# Patient Record
Sex: Male | Born: 1940 | Race: White | Hispanic: No | Marital: Married | Smoking: Never smoker
Health system: Southern US, Community
[De-identification: ages and names within clinical notes are randomized; demographics above are authoritative.]

## PROBLEM LIST (undated history)

## (undated) DIAGNOSIS — E785 Hyperlipidemia, unspecified: Secondary | ICD-10-CM

## (undated) DIAGNOSIS — M4800 Spinal stenosis, site unspecified: Secondary | ICD-10-CM

## (undated) DIAGNOSIS — D649 Anemia, unspecified: Secondary | ICD-10-CM

## (undated) DIAGNOSIS — M47812 Spondylosis without myelopathy or radiculopathy, cervical region: Secondary | ICD-10-CM

## (undated) DIAGNOSIS — G4730 Sleep apnea, unspecified: Secondary | ICD-10-CM

## (undated) DIAGNOSIS — R000 Tachycardia, unspecified: Secondary | ICD-10-CM

## (undated) DIAGNOSIS — K219 Gastro-esophageal reflux disease without esophagitis: Secondary | ICD-10-CM

## (undated) DIAGNOSIS — H269 Unspecified cataract: Secondary | ICD-10-CM

## (undated) DIAGNOSIS — I499 Cardiac arrhythmia, unspecified: Secondary | ICD-10-CM

## (undated) DIAGNOSIS — F32A Depression, unspecified: Secondary | ICD-10-CM

## (undated) DIAGNOSIS — F419 Anxiety disorder, unspecified: Secondary | ICD-10-CM

## (undated) DIAGNOSIS — R351 Nocturia: Secondary | ICD-10-CM

## (undated) DIAGNOSIS — I4891 Unspecified atrial fibrillation: Secondary | ICD-10-CM

## (undated) DIAGNOSIS — Z8679 Personal history of other diseases of the circulatory system: Secondary | ICD-10-CM

## (undated) DIAGNOSIS — E119 Type 2 diabetes mellitus without complications: Secondary | ICD-10-CM

## (undated) DIAGNOSIS — H9190 Unspecified hearing loss, unspecified ear: Secondary | ICD-10-CM

## (undated) DIAGNOSIS — Z87442 Personal history of urinary calculi: Secondary | ICD-10-CM

## (undated) DIAGNOSIS — I10 Essential (primary) hypertension: Secondary | ICD-10-CM

## (undated) DIAGNOSIS — Z9889 Other specified postprocedural states: Secondary | ICD-10-CM

## (undated) DIAGNOSIS — F329 Major depressive disorder, single episode, unspecified: Secondary | ICD-10-CM

## (undated) DIAGNOSIS — T8859XA Other complications of anesthesia, initial encounter: Secondary | ICD-10-CM

## (undated) DIAGNOSIS — M1990 Unspecified osteoarthritis, unspecified site: Secondary | ICD-10-CM

## (undated) DIAGNOSIS — T4145XA Adverse effect of unspecified anesthetic, initial encounter: Secondary | ICD-10-CM

## (undated) HISTORY — DX: Unspecified atrial fibrillation: I48.91

## (undated) HISTORY — DX: Hyperlipidemia, unspecified: E78.5

## (undated) HISTORY — DX: Essential (primary) hypertension: I10

## (undated) HISTORY — PX: ABLATION: SHX5711

## (undated) HISTORY — PX: JOINT REPLACEMENT: SHX530

## (undated) HISTORY — DX: Other specified postprocedural states: Z98.890

## (undated) HISTORY — PX: HERNIA REPAIR: SHX51

## (undated) HISTORY — DX: Unspecified osteoarthritis, unspecified site: M19.90

## (undated) HISTORY — DX: Sleep apnea, unspecified: G47.30

## (undated) HISTORY — DX: Spinal stenosis, site unspecified: M48.00

## (undated) HISTORY — DX: Anxiety disorder, unspecified: F41.9

## (undated) HISTORY — DX: Depression, unspecified: F32.A

## (undated) HISTORY — PX: EYE SURGERY: SHX253

## (undated) HISTORY — PX: BACK SURGERY: SHX140

## (undated) HISTORY — DX: Personal history of other diseases of the circulatory system: Z86.79

## (undated) HISTORY — DX: Major depressive disorder, single episode, unspecified: F32.9

## (undated) HISTORY — PX: TONSILLECTOMY: SUR1361

## (undated) HISTORY — DX: Tachycardia, unspecified: R00.0

## (undated) HISTORY — DX: Type 2 diabetes mellitus without complications: E11.9

## (undated) HISTORY — DX: Spondylosis without myelopathy or radiculopathy, cervical region: M47.812

## (undated) HISTORY — PX: CARDIAC CATHETERIZATION: SHX172

---

## 2005-01-23 ENCOUNTER — Ambulatory Visit: Payer: Self-pay | Admitting: Pain Medicine

## 2005-11-22 ENCOUNTER — Ambulatory Visit: Payer: Self-pay | Admitting: Gastroenterology

## 2006-02-12 ENCOUNTER — Encounter: Payer: Self-pay | Admitting: Internal Medicine

## 2007-07-22 ENCOUNTER — Ambulatory Visit: Payer: Self-pay | Admitting: Internal Medicine

## 2008-03-30 ENCOUNTER — Ambulatory Visit: Payer: Self-pay | Admitting: Internal Medicine

## 2008-04-06 ENCOUNTER — Ambulatory Visit: Payer: Self-pay | Admitting: Family Medicine

## 2008-04-13 ENCOUNTER — Ambulatory Visit: Payer: Self-pay | Admitting: Family Medicine

## 2008-04-28 ENCOUNTER — Ambulatory Visit: Payer: Self-pay | Admitting: Internal Medicine

## 2008-05-14 ENCOUNTER — Ambulatory Visit: Payer: Self-pay | Admitting: Family Medicine

## 2008-05-22 ENCOUNTER — Inpatient Hospital Stay: Payer: Self-pay | Admitting: Internal Medicine

## 2008-09-15 ENCOUNTER — Emergency Department: Payer: Self-pay | Admitting: Internal Medicine

## 2010-03-13 ENCOUNTER — Ambulatory Visit: Payer: Self-pay | Admitting: Internal Medicine

## 2010-03-21 ENCOUNTER — Ambulatory Visit: Payer: Self-pay

## 2010-04-14 ENCOUNTER — Ambulatory Visit: Payer: Self-pay

## 2010-06-28 NOTE — Letter (Signed)
July 22, 2007    Sean Reyes Reyes,  M.D.  [address] Sean Reyes.  Sean Re[place], [place]  [address]   RE:  Sean Reyes, Reyes  MRN:  [id number]  /  DOB:  08/11/1940   Dear Sean Reyes Reyes:   It was a pleasure to see Sean Reyes Reyes that I think is a request of yours  concerning atrial fibrillation.   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  These  episodes are quite symptomatic with fatigue, exercise intolerance, and  significant recovery wasting.  He has been tried on Sean Reyes Reyes which worked  for a number of months and then failed. He was then submitted for atrial  fibrillation ablation in October 2008 which was complicated by cardiac  perforation, resulting in its being aborted.  I do not know if any RF  was delivered.  He ended up in the hospital with a pericardial drain,  was subsequently discharged, and then was treated with sotalol. Sotalol  was increased to 160 b.i.d., and, over the last 6 months, he has done  pretty well with now relatively infrequent spells occurring about once a  month.  Sometimes they are quite severe. Most recently it happened about  4 weeks ago and left him fatigued for a good couple of days afterwards.   His LV function is apparently normal. I infer this rather than have  specific data regarding this.   His thromboembolic risk factors are notable for hypertension and  diabetes.   CURRENT MEDICATIONS:  1. Sotalol 160 b.i.d.  2. Warfarin.  3. Diovan.  4. Metoprolol 50 a day.   ALLERGIES:  He is allergic to Sean Reyes Reyes.   REVIEW OF SYSTEMS:  Noncontributory but includes arthritis and  depression.   SOCIAL HISTORY:  There is a great deal of stress at work in that his  partner has been out following bypass for the last 6 months.   PHYSICAL EXAMINATION:  GENERAL:  He is a middle-age Caucasian male  appearing his stated age of 61, almost 3.  VITAL SIGNS:  His blood pressure today  was 160/80.  His pulse was 59.  His weight was 217.  HEENT:  Exam demonstrated no icterus or xanthoma.  NECK:  The neck veins were flat.  The carotids are brisk and full  bilaterally without bruits.  BACK:  Without kyphosis, scoliosis.  LUNGS:  Clear.  HEART:  Sounds were regular without murmurs or gallops.  ABDOMEN:  Soft with active bowel sounds without midline pulsation or  hepatomegaly.  EXTREMITIES:  Femoral pulses were 2+. Distal pulses were intact.  There  was no clubbing, cyanosis or edema.  NEUROLOGIC:  Exam was grossly normal.  SKIN:  Warm and dry.   Electrocardiogram dated today demonstrated sinus rhythm at 59 with  intervals of 0.19/0.19/0.44. The axis was 62 degrees. There were some T-  wave flattening.   IMPRESSION:  1. Paroxysmal atrial fibrillation.  2. Sotalol therapy for #1.  3. Status post ablation attempt, aborted by cardiac perforation.  4. Thromboembolic risk factors notable for:      a.     Diabetes.      b.     Hypertension.   Sean Reyes Reyes, Sean Reyes Reyes has paroxysmal atrial fibrillation that is quite  symptomatic.  We discussed treatment options including catheter ablation  as well as alternative antiarrhythmic drug therapy including amiodarone.  I should note that he is probably also a  candidate for flecainide and  propafenone.  He at this point, though, feels like his events are  sufficiently infrequent and the stress from the perforation was  sufficiently great that he and his family would like to hold steady on  his current course.  We elected, thus, to continue sotalol at 160 b.i.d.  I would ask that you keep close track of his potassium as it should be  maintained greater than 4.   We will plan to see him again in 2-3 months' time, and he is to let us  know if he has a significant up tick and the frequency of episodes.   Thanks very much for the consultation.    Sincerely,      Sean Reyes Salvia, MD, Sean Reyes. Sean Reyes Reyes  Electronically Signed    SCK/MedQ  DD:  07/22/2007  DT: 07/22/2007  Job #: [id number]

## 2010-07-25 ENCOUNTER — Encounter: Payer: Self-pay | Admitting: Cardiovascular Disease

## 2010-08-15 ENCOUNTER — Ambulatory Visit: Payer: Self-pay | Admitting: Surgery

## 2010-08-22 ENCOUNTER — Ambulatory Visit: Payer: Self-pay | Admitting: Surgery

## 2010-08-26 ENCOUNTER — Emergency Department: Payer: Self-pay | Admitting: *Deleted

## 2010-08-27 ENCOUNTER — Inpatient Hospital Stay: Payer: Self-pay | Admitting: General Surgery

## 2010-08-29 ENCOUNTER — Observation Stay: Payer: Self-pay | Admitting: Surgery

## 2011-02-21 DIAGNOSIS — I4891 Unspecified atrial fibrillation: Secondary | ICD-10-CM | POA: Diagnosis not present

## 2011-03-23 DIAGNOSIS — I4891 Unspecified atrial fibrillation: Secondary | ICD-10-CM | POA: Diagnosis not present

## 2011-03-23 DIAGNOSIS — E119 Type 2 diabetes mellitus without complications: Secondary | ICD-10-CM | POA: Diagnosis not present

## 2011-04-18 DIAGNOSIS — Z7901 Long term (current) use of anticoagulants: Secondary | ICD-10-CM | POA: Diagnosis not present

## 2011-04-18 DIAGNOSIS — I10 Essential (primary) hypertension: Secondary | ICD-10-CM | POA: Diagnosis not present

## 2011-04-18 DIAGNOSIS — I4891 Unspecified atrial fibrillation: Secondary | ICD-10-CM | POA: Diagnosis not present

## 2011-04-18 DIAGNOSIS — E119 Type 2 diabetes mellitus without complications: Secondary | ICD-10-CM | POA: Diagnosis not present

## 2011-04-20 DIAGNOSIS — I4891 Unspecified atrial fibrillation: Secondary | ICD-10-CM | POA: Diagnosis not present

## 2011-05-22 DIAGNOSIS — I4891 Unspecified atrial fibrillation: Secondary | ICD-10-CM | POA: Diagnosis not present

## 2011-06-20 DIAGNOSIS — I10 Essential (primary) hypertension: Secondary | ICD-10-CM | POA: Diagnosis not present

## 2011-06-20 DIAGNOSIS — I4891 Unspecified atrial fibrillation: Secondary | ICD-10-CM | POA: Diagnosis not present

## 2011-06-20 DIAGNOSIS — E7800 Pure hypercholesterolemia, unspecified: Secondary | ICD-10-CM | POA: Diagnosis not present

## 2011-06-20 DIAGNOSIS — E119 Type 2 diabetes mellitus without complications: Secondary | ICD-10-CM | POA: Diagnosis not present

## 2011-07-17 DIAGNOSIS — L821 Other seborrheic keratosis: Secondary | ICD-10-CM | POA: Diagnosis not present

## 2011-07-17 DIAGNOSIS — I8310 Varicose veins of unspecified lower extremity with inflammation: Secondary | ICD-10-CM | POA: Diagnosis not present

## 2011-07-17 DIAGNOSIS — L570 Actinic keratosis: Secondary | ICD-10-CM | POA: Diagnosis not present

## 2011-07-20 DIAGNOSIS — I4891 Unspecified atrial fibrillation: Secondary | ICD-10-CM | POA: Diagnosis not present

## 2011-08-30 DIAGNOSIS — I10 Essential (primary) hypertension: Secondary | ICD-10-CM | POA: Diagnosis not present

## 2011-08-30 DIAGNOSIS — E7800 Pure hypercholesterolemia, unspecified: Secondary | ICD-10-CM | POA: Diagnosis not present

## 2011-08-30 DIAGNOSIS — E119 Type 2 diabetes mellitus without complications: Secondary | ICD-10-CM | POA: Diagnosis not present

## 2011-08-30 DIAGNOSIS — I4891 Unspecified atrial fibrillation: Secondary | ICD-10-CM | POA: Diagnosis not present

## 2011-09-05 DIAGNOSIS — E119 Type 2 diabetes mellitus without complications: Secondary | ICD-10-CM | POA: Diagnosis not present

## 2011-09-05 DIAGNOSIS — I059 Rheumatic mitral valve disease, unspecified: Secondary | ICD-10-CM | POA: Diagnosis not present

## 2011-09-05 DIAGNOSIS — I10 Essential (primary) hypertension: Secondary | ICD-10-CM | POA: Diagnosis not present

## 2011-09-05 DIAGNOSIS — I4891 Unspecified atrial fibrillation: Secondary | ICD-10-CM | POA: Diagnosis not present

## 2011-09-15 DIAGNOSIS — I4891 Unspecified atrial fibrillation: Secondary | ICD-10-CM | POA: Diagnosis not present

## 2011-09-28 DIAGNOSIS — I4891 Unspecified atrial fibrillation: Secondary | ICD-10-CM | POA: Diagnosis not present

## 2011-10-17 DIAGNOSIS — I10 Essential (primary) hypertension: Secondary | ICD-10-CM | POA: Diagnosis not present

## 2011-10-17 DIAGNOSIS — I4891 Unspecified atrial fibrillation: Secondary | ICD-10-CM | POA: Diagnosis not present

## 2011-10-17 DIAGNOSIS — E119 Type 2 diabetes mellitus without complications: Secondary | ICD-10-CM | POA: Diagnosis not present

## 2011-10-17 DIAGNOSIS — Z7901 Long term (current) use of anticoagulants: Secondary | ICD-10-CM | POA: Diagnosis not present

## 2011-10-19 DIAGNOSIS — I480 Paroxysmal atrial fibrillation: Secondary | ICD-10-CM | POA: Insufficient documentation

## 2011-10-19 DIAGNOSIS — Z9889 Other specified postprocedural states: Secondary | ICD-10-CM | POA: Insufficient documentation

## 2011-10-20 DIAGNOSIS — M1990 Unspecified osteoarthritis, unspecified site: Secondary | ICD-10-CM | POA: Insufficient documentation

## 2011-10-20 DIAGNOSIS — I4891 Unspecified atrial fibrillation: Secondary | ICD-10-CM | POA: Diagnosis not present

## 2011-10-27 DIAGNOSIS — E119 Type 2 diabetes mellitus without complications: Secondary | ICD-10-CM | POA: Diagnosis present

## 2011-10-27 DIAGNOSIS — I4891 Unspecified atrial fibrillation: Secondary | ICD-10-CM | POA: Diagnosis not present

## 2011-10-27 DIAGNOSIS — I10 Essential (primary) hypertension: Secondary | ICD-10-CM | POA: Diagnosis present

## 2011-10-27 DIAGNOSIS — M129 Arthropathy, unspecified: Secondary | ICD-10-CM | POA: Diagnosis present

## 2011-11-06 DIAGNOSIS — E876 Hypokalemia: Secondary | ICD-10-CM | POA: Diagnosis not present

## 2011-11-06 DIAGNOSIS — I4891 Unspecified atrial fibrillation: Secondary | ICD-10-CM | POA: Diagnosis not present

## 2011-11-13 DIAGNOSIS — T169XXA Foreign body in ear, unspecified ear, initial encounter: Secondary | ICD-10-CM | POA: Diagnosis not present

## 2011-11-13 DIAGNOSIS — I4891 Unspecified atrial fibrillation: Secondary | ICD-10-CM | POA: Diagnosis not present

## 2011-12-05 DIAGNOSIS — I4891 Unspecified atrial fibrillation: Secondary | ICD-10-CM | POA: Diagnosis not present

## 2011-12-05 DIAGNOSIS — I059 Rheumatic mitral valve disease, unspecified: Secondary | ICD-10-CM | POA: Diagnosis not present

## 2011-12-05 DIAGNOSIS — I209 Angina pectoris, unspecified: Secondary | ICD-10-CM | POA: Diagnosis not present

## 2011-12-05 DIAGNOSIS — I119 Hypertensive heart disease without heart failure: Secondary | ICD-10-CM | POA: Diagnosis not present

## 2011-12-11 DIAGNOSIS — I209 Angina pectoris, unspecified: Secondary | ICD-10-CM | POA: Diagnosis not present

## 2011-12-13 DIAGNOSIS — T169XXA Foreign body in ear, unspecified ear, initial encounter: Secondary | ICD-10-CM | POA: Diagnosis not present

## 2011-12-13 DIAGNOSIS — I4891 Unspecified atrial fibrillation: Secondary | ICD-10-CM | POA: Diagnosis not present

## 2011-12-15 DIAGNOSIS — Z7901 Long term (current) use of anticoagulants: Secondary | ICD-10-CM | POA: Diagnosis not present

## 2011-12-15 DIAGNOSIS — I4891 Unspecified atrial fibrillation: Secondary | ICD-10-CM | POA: Diagnosis not present

## 2011-12-15 DIAGNOSIS — I10 Essential (primary) hypertension: Secondary | ICD-10-CM | POA: Diagnosis not present

## 2011-12-19 DIAGNOSIS — R69 Illness, unspecified: Secondary | ICD-10-CM | POA: Diagnosis not present

## 2011-12-19 DIAGNOSIS — R7989 Other specified abnormal findings of blood chemistry: Secondary | ICD-10-CM | POA: Diagnosis not present

## 2011-12-19 DIAGNOSIS — I4891 Unspecified atrial fibrillation: Secondary | ICD-10-CM | POA: Diagnosis not present

## 2011-12-20 DIAGNOSIS — G4730 Sleep apnea, unspecified: Secondary | ICD-10-CM | POA: Diagnosis present

## 2011-12-20 DIAGNOSIS — I459 Conduction disorder, unspecified: Secondary | ICD-10-CM | POA: Insufficient documentation

## 2011-12-20 DIAGNOSIS — D649 Anemia, unspecified: Secondary | ICD-10-CM | POA: Diagnosis not present

## 2011-12-20 DIAGNOSIS — N179 Acute kidney failure, unspecified: Secondary | ICD-10-CM | POA: Diagnosis not present

## 2011-12-20 DIAGNOSIS — I10 Essential (primary) hypertension: Secondary | ICD-10-CM | POA: Diagnosis present

## 2011-12-20 DIAGNOSIS — I499 Cardiac arrhythmia, unspecified: Secondary | ICD-10-CM | POA: Diagnosis not present

## 2011-12-20 DIAGNOSIS — I959 Hypotension, unspecified: Secondary | ICD-10-CM | POA: Diagnosis not present

## 2011-12-20 DIAGNOSIS — E119 Type 2 diabetes mellitus without complications: Secondary | ICD-10-CM | POA: Diagnosis present

## 2011-12-20 DIAGNOSIS — I4891 Unspecified atrial fibrillation: Secondary | ICD-10-CM | POA: Diagnosis not present

## 2011-12-20 DIAGNOSIS — N400 Enlarged prostate without lower urinary tract symptoms: Secondary | ICD-10-CM | POA: Diagnosis present

## 2011-12-20 DIAGNOSIS — IMO0002 Reserved for concepts with insufficient information to code with codable children: Secondary | ICD-10-CM | POA: Diagnosis present

## 2011-12-20 DIAGNOSIS — I9589 Other hypotension: Secondary | ICD-10-CM | POA: Diagnosis present

## 2011-12-25 DIAGNOSIS — I4891 Unspecified atrial fibrillation: Secondary | ICD-10-CM | POA: Diagnosis not present

## 2011-12-27 DIAGNOSIS — Z23 Encounter for immunization: Secondary | ICD-10-CM | POA: Diagnosis not present

## 2011-12-28 DIAGNOSIS — E119 Type 2 diabetes mellitus without complications: Secondary | ICD-10-CM | POA: Diagnosis not present

## 2011-12-28 DIAGNOSIS — Z0000 Encounter for general adult medical examination without abnormal findings: Secondary | ICD-10-CM | POA: Diagnosis not present

## 2011-12-28 DIAGNOSIS — I10 Essential (primary) hypertension: Secondary | ICD-10-CM | POA: Diagnosis not present

## 2011-12-28 DIAGNOSIS — E7800 Pure hypercholesterolemia, unspecified: Secondary | ICD-10-CM | POA: Diagnosis not present

## 2011-12-28 DIAGNOSIS — Z125 Encounter for screening for malignant neoplasm of prostate: Secondary | ICD-10-CM | POA: Diagnosis not present

## 2011-12-28 DIAGNOSIS — I4891 Unspecified atrial fibrillation: Secondary | ICD-10-CM | POA: Diagnosis not present

## 2012-01-01 DIAGNOSIS — I4891 Unspecified atrial fibrillation: Secondary | ICD-10-CM | POA: Diagnosis not present

## 2012-01-03 DIAGNOSIS — I4891 Unspecified atrial fibrillation: Secondary | ICD-10-CM | POA: Diagnosis not present

## 2012-01-04 DIAGNOSIS — I4891 Unspecified atrial fibrillation: Secondary | ICD-10-CM | POA: Diagnosis not present

## 2012-01-16 DIAGNOSIS — D239 Other benign neoplasm of skin, unspecified: Secondary | ICD-10-CM | POA: Diagnosis not present

## 2012-01-16 DIAGNOSIS — L570 Actinic keratosis: Secondary | ICD-10-CM | POA: Diagnosis not present

## 2012-01-16 DIAGNOSIS — L578 Other skin changes due to chronic exposure to nonionizing radiation: Secondary | ICD-10-CM | POA: Diagnosis not present

## 2012-01-16 DIAGNOSIS — D485 Neoplasm of uncertain behavior of skin: Secondary | ICD-10-CM | POA: Diagnosis not present

## 2012-01-16 DIAGNOSIS — L821 Other seborrheic keratosis: Secondary | ICD-10-CM | POA: Diagnosis not present

## 2012-01-24 DIAGNOSIS — I10 Essential (primary) hypertension: Secondary | ICD-10-CM | POA: Diagnosis not present

## 2012-01-24 DIAGNOSIS — E782 Mixed hyperlipidemia: Secondary | ICD-10-CM | POA: Diagnosis not present

## 2012-01-24 DIAGNOSIS — I4891 Unspecified atrial fibrillation: Secondary | ICD-10-CM | POA: Diagnosis not present

## 2012-01-26 DIAGNOSIS — R5381 Other malaise: Secondary | ICD-10-CM | POA: Diagnosis not present

## 2012-01-26 DIAGNOSIS — R5383 Other fatigue: Secondary | ICD-10-CM | POA: Diagnosis not present

## 2012-01-26 DIAGNOSIS — Z7901 Long term (current) use of anticoagulants: Secondary | ICD-10-CM | POA: Diagnosis not present

## 2012-01-26 DIAGNOSIS — I4891 Unspecified atrial fibrillation: Secondary | ICD-10-CM | POA: Diagnosis not present

## 2012-01-31 DIAGNOSIS — I4891 Unspecified atrial fibrillation: Secondary | ICD-10-CM | POA: Diagnosis not present

## 2012-02-06 DIAGNOSIS — I10 Essential (primary) hypertension: Secondary | ICD-10-CM | POA: Diagnosis not present

## 2012-02-06 DIAGNOSIS — I059 Rheumatic mitral valve disease, unspecified: Secondary | ICD-10-CM | POA: Diagnosis not present

## 2012-02-06 DIAGNOSIS — E782 Mixed hyperlipidemia: Secondary | ICD-10-CM | POA: Diagnosis not present

## 2012-02-06 DIAGNOSIS — I4891 Unspecified atrial fibrillation: Secondary | ICD-10-CM | POA: Diagnosis not present

## 2012-02-27 DIAGNOSIS — I4891 Unspecified atrial fibrillation: Secondary | ICD-10-CM | POA: Diagnosis not present

## 2012-03-01 DIAGNOSIS — I10 Essential (primary) hypertension: Secondary | ICD-10-CM | POA: Diagnosis not present

## 2012-03-01 DIAGNOSIS — I4891 Unspecified atrial fibrillation: Secondary | ICD-10-CM | POA: Diagnosis not present

## 2012-04-01 DIAGNOSIS — E782 Mixed hyperlipidemia: Secondary | ICD-10-CM | POA: Diagnosis not present

## 2012-04-01 DIAGNOSIS — I059 Rheumatic mitral valve disease, unspecified: Secondary | ICD-10-CM | POA: Diagnosis not present

## 2012-04-01 DIAGNOSIS — I10 Essential (primary) hypertension: Secondary | ICD-10-CM | POA: Diagnosis not present

## 2012-04-01 DIAGNOSIS — I4891 Unspecified atrial fibrillation: Secondary | ICD-10-CM | POA: Diagnosis not present

## 2012-05-01 DIAGNOSIS — I4891 Unspecified atrial fibrillation: Secondary | ICD-10-CM | POA: Diagnosis not present

## 2012-05-16 DIAGNOSIS — E119 Type 2 diabetes mellitus without complications: Secondary | ICD-10-CM | POA: Diagnosis not present

## 2012-05-16 DIAGNOSIS — I10 Essential (primary) hypertension: Secondary | ICD-10-CM | POA: Diagnosis not present

## 2012-05-16 DIAGNOSIS — I4891 Unspecified atrial fibrillation: Secondary | ICD-10-CM | POA: Diagnosis not present

## 2012-06-11 DIAGNOSIS — I4891 Unspecified atrial fibrillation: Secondary | ICD-10-CM | POA: Diagnosis not present

## 2012-06-19 DIAGNOSIS — I4891 Unspecified atrial fibrillation: Secondary | ICD-10-CM | POA: Diagnosis not present

## 2012-06-19 DIAGNOSIS — I10 Essential (primary) hypertension: Secondary | ICD-10-CM | POA: Diagnosis not present

## 2012-06-19 DIAGNOSIS — Z7901 Long term (current) use of anticoagulants: Secondary | ICD-10-CM | POA: Diagnosis not present

## 2012-06-24 DIAGNOSIS — E119 Type 2 diabetes mellitus without complications: Secondary | ICD-10-CM | POA: Diagnosis not present

## 2012-06-24 DIAGNOSIS — H2510 Age-related nuclear cataract, unspecified eye: Secondary | ICD-10-CM | POA: Diagnosis not present

## 2012-06-25 DIAGNOSIS — I4891 Unspecified atrial fibrillation: Secondary | ICD-10-CM | POA: Diagnosis not present

## 2012-06-26 DIAGNOSIS — I4891 Unspecified atrial fibrillation: Secondary | ICD-10-CM | POA: Diagnosis not present

## 2012-07-01 ENCOUNTER — Ambulatory Visit: Payer: Self-pay | Admitting: Internal Medicine

## 2012-07-01 DIAGNOSIS — I10 Essential (primary) hypertension: Secondary | ICD-10-CM | POA: Diagnosis not present

## 2012-07-01 DIAGNOSIS — E785 Hyperlipidemia, unspecified: Secondary | ICD-10-CM | POA: Diagnosis not present

## 2012-07-01 DIAGNOSIS — Z7901 Long term (current) use of anticoagulants: Secondary | ICD-10-CM | POA: Diagnosis not present

## 2012-07-01 DIAGNOSIS — Z79899 Other long term (current) drug therapy: Secondary | ICD-10-CM | POA: Diagnosis not present

## 2012-07-01 DIAGNOSIS — G4730 Sleep apnea, unspecified: Secondary | ICD-10-CM | POA: Diagnosis not present

## 2012-07-01 DIAGNOSIS — Z889 Allergy status to unspecified drugs, medicaments and biological substances status: Secondary | ICD-10-CM | POA: Diagnosis not present

## 2012-07-01 DIAGNOSIS — Z8489 Family history of other specified conditions: Secondary | ICD-10-CM | POA: Diagnosis not present

## 2012-07-01 DIAGNOSIS — I4510 Unspecified right bundle-branch block: Secondary | ICD-10-CM | POA: Diagnosis not present

## 2012-07-01 DIAGNOSIS — E119 Type 2 diabetes mellitus without complications: Secondary | ICD-10-CM | POA: Diagnosis not present

## 2012-07-01 DIAGNOSIS — I4891 Unspecified atrial fibrillation: Secondary | ICD-10-CM | POA: Diagnosis not present

## 2012-07-01 LAB — PROTIME-INR
INR: 2.9
Prothrombin Time: 29.3 secs — ABNORMAL HIGH (ref 11.5–14.7)

## 2012-07-01 LAB — BASIC METABOLIC PANEL
Anion Gap: 5 — ABNORMAL LOW (ref 7–16)
BUN: 14 mg/dL (ref 7–18)
Calcium, Total: 9.2 mg/dL (ref 8.5–10.1)
Chloride: 104 mmol/L (ref 98–107)
Co2: 29 mmol/L (ref 21–32)
Creatinine: 1.27 mg/dL (ref 0.60–1.30)
EGFR (African American): >60
EGFR (Non-African Amer.): 57 — ABNORMAL LOW
Glucose: 120 mg/dL — ABNORMAL HIGH (ref 65–99)
Osmolality: 277 (ref 275–301)
Potassium: 3.9 mmol/L (ref 3.5–5.1)
Sodium: 138 mmol/L (ref 136–145)

## 2012-07-02 DIAGNOSIS — I4891 Unspecified atrial fibrillation: Secondary | ICD-10-CM | POA: Diagnosis not present

## 2012-07-02 DIAGNOSIS — Z9889 Other specified postprocedural states: Secondary | ICD-10-CM | POA: Diagnosis not present

## 2012-07-02 DIAGNOSIS — G4733 Obstructive sleep apnea (adult) (pediatric): Secondary | ICD-10-CM | POA: Diagnosis not present

## 2012-07-02 DIAGNOSIS — I10 Essential (primary) hypertension: Secondary | ICD-10-CM | POA: Diagnosis not present

## 2012-07-15 DIAGNOSIS — I059 Rheumatic mitral valve disease, unspecified: Secondary | ICD-10-CM | POA: Diagnosis not present

## 2012-07-15 DIAGNOSIS — I4891 Unspecified atrial fibrillation: Secondary | ICD-10-CM | POA: Diagnosis not present

## 2012-07-15 DIAGNOSIS — E782 Mixed hyperlipidemia: Secondary | ICD-10-CM | POA: Diagnosis not present

## 2012-07-15 DIAGNOSIS — I119 Hypertensive heart disease without heart failure: Secondary | ICD-10-CM | POA: Diagnosis not present

## 2012-07-16 DIAGNOSIS — D239 Other benign neoplasm of skin, unspecified: Secondary | ICD-10-CM | POA: Diagnosis not present

## 2012-07-16 DIAGNOSIS — D485 Neoplasm of uncertain behavior of skin: Secondary | ICD-10-CM | POA: Diagnosis not present

## 2012-07-16 DIAGNOSIS — L821 Other seborrheic keratosis: Secondary | ICD-10-CM | POA: Diagnosis not present

## 2012-07-16 DIAGNOSIS — L578 Other skin changes due to chronic exposure to nonionizing radiation: Secondary | ICD-10-CM | POA: Diagnosis not present

## 2012-07-25 DIAGNOSIS — I4891 Unspecified atrial fibrillation: Secondary | ICD-10-CM | POA: Diagnosis not present

## 2012-08-26 DIAGNOSIS — I059 Rheumatic mitral valve disease, unspecified: Secondary | ICD-10-CM | POA: Diagnosis not present

## 2012-08-26 DIAGNOSIS — I4891 Unspecified atrial fibrillation: Secondary | ICD-10-CM | POA: Diagnosis not present

## 2012-08-26 DIAGNOSIS — I119 Hypertensive heart disease without heart failure: Secondary | ICD-10-CM | POA: Diagnosis not present

## 2012-08-26 DIAGNOSIS — E782 Mixed hyperlipidemia: Secondary | ICD-10-CM | POA: Diagnosis not present

## 2012-09-02 DIAGNOSIS — I10 Essential (primary) hypertension: Secondary | ICD-10-CM | POA: Diagnosis not present

## 2012-09-02 DIAGNOSIS — E7800 Pure hypercholesterolemia, unspecified: Secondary | ICD-10-CM | POA: Diagnosis not present

## 2012-09-02 DIAGNOSIS — E119 Type 2 diabetes mellitus without complications: Secondary | ICD-10-CM | POA: Diagnosis not present

## 2012-09-02 DIAGNOSIS — I4891 Unspecified atrial fibrillation: Secondary | ICD-10-CM | POA: Diagnosis not present

## 2012-10-02 DIAGNOSIS — I4891 Unspecified atrial fibrillation: Secondary | ICD-10-CM | POA: Diagnosis not present

## 2012-11-05 DIAGNOSIS — I4891 Unspecified atrial fibrillation: Secondary | ICD-10-CM | POA: Diagnosis not present

## 2012-12-05 DIAGNOSIS — E119 Type 2 diabetes mellitus without complications: Secondary | ICD-10-CM | POA: Diagnosis not present

## 2012-12-05 DIAGNOSIS — E7800 Pure hypercholesterolemia, unspecified: Secondary | ICD-10-CM | POA: Diagnosis not present

## 2012-12-05 DIAGNOSIS — I4891 Unspecified atrial fibrillation: Secondary | ICD-10-CM | POA: Diagnosis not present

## 2012-12-05 DIAGNOSIS — I10 Essential (primary) hypertension: Secondary | ICD-10-CM | POA: Diagnosis not present

## 2012-12-18 DIAGNOSIS — Z23 Encounter for immunization: Secondary | ICD-10-CM | POA: Diagnosis not present

## 2012-12-23 DIAGNOSIS — M25559 Pain in unspecified hip: Secondary | ICD-10-CM | POA: Diagnosis not present

## 2012-12-23 DIAGNOSIS — M545 Low back pain, unspecified: Secondary | ICD-10-CM | POA: Diagnosis not present

## 2012-12-23 DIAGNOSIS — M47817 Spondylosis without myelopathy or radiculopathy, lumbosacral region: Secondary | ICD-10-CM | POA: Diagnosis not present

## 2012-12-23 DIAGNOSIS — Z96649 Presence of unspecified artificial hip joint: Secondary | ICD-10-CM | POA: Diagnosis not present

## 2012-12-23 DIAGNOSIS — M25459 Effusion, unspecified hip: Secondary | ICD-10-CM | POA: Diagnosis not present

## 2012-12-31 DIAGNOSIS — I10 Essential (primary) hypertension: Secondary | ICD-10-CM | POA: Diagnosis not present

## 2012-12-31 DIAGNOSIS — I4891 Unspecified atrial fibrillation: Secondary | ICD-10-CM | POA: Diagnosis not present

## 2012-12-31 DIAGNOSIS — Z7901 Long term (current) use of anticoagulants: Secondary | ICD-10-CM | POA: Diagnosis not present

## 2013-01-01 DIAGNOSIS — I4891 Unspecified atrial fibrillation: Secondary | ICD-10-CM | POA: Diagnosis not present

## 2013-01-02 DIAGNOSIS — M7989 Other specified soft tissue disorders: Secondary | ICD-10-CM | POA: Diagnosis not present

## 2013-01-02 DIAGNOSIS — M25559 Pain in unspecified hip: Secondary | ICD-10-CM | POA: Diagnosis not present

## 2013-01-14 DIAGNOSIS — M25559 Pain in unspecified hip: Secondary | ICD-10-CM | POA: Diagnosis not present

## 2013-01-14 DIAGNOSIS — Z96649 Presence of unspecified artificial hip joint: Secondary | ICD-10-CM | POA: Diagnosis not present

## 2013-01-14 DIAGNOSIS — M25459 Effusion, unspecified hip: Secondary | ICD-10-CM | POA: Diagnosis not present

## 2013-01-16 DIAGNOSIS — I4891 Unspecified atrial fibrillation: Secondary | ICD-10-CM | POA: Diagnosis not present

## 2013-01-22 DIAGNOSIS — R1032 Left lower quadrant pain: Secondary | ICD-10-CM | POA: Diagnosis not present

## 2013-01-22 DIAGNOSIS — M25559 Pain in unspecified hip: Secondary | ICD-10-CM | POA: Diagnosis not present

## 2013-01-22 DIAGNOSIS — M169 Osteoarthritis of hip, unspecified: Secondary | ICD-10-CM | POA: Diagnosis not present

## 2013-01-22 DIAGNOSIS — M1610 Unilateral primary osteoarthritis, unspecified hip: Secondary | ICD-10-CM | POA: Diagnosis not present

## 2013-02-14 DIAGNOSIS — I4891 Unspecified atrial fibrillation: Secondary | ICD-10-CM | POA: Diagnosis not present

## 2013-02-18 DIAGNOSIS — I4891 Unspecified atrial fibrillation: Secondary | ICD-10-CM | POA: Diagnosis not present

## 2013-03-03 DIAGNOSIS — I4891 Unspecified atrial fibrillation: Secondary | ICD-10-CM | POA: Diagnosis not present

## 2013-03-03 DIAGNOSIS — E7800 Pure hypercholesterolemia, unspecified: Secondary | ICD-10-CM | POA: Diagnosis not present

## 2013-03-03 DIAGNOSIS — E119 Type 2 diabetes mellitus without complications: Secondary | ICD-10-CM | POA: Diagnosis not present

## 2013-03-03 DIAGNOSIS — I10 Essential (primary) hypertension: Secondary | ICD-10-CM | POA: Diagnosis not present

## 2013-03-03 DIAGNOSIS — E785 Hyperlipidemia, unspecified: Secondary | ICD-10-CM | POA: Diagnosis not present

## 2013-03-18 DIAGNOSIS — D239 Other benign neoplasm of skin, unspecified: Secondary | ICD-10-CM | POA: Diagnosis not present

## 2013-03-18 DIAGNOSIS — L821 Other seborrheic keratosis: Secondary | ICD-10-CM | POA: Diagnosis not present

## 2013-03-18 DIAGNOSIS — L578 Other skin changes due to chronic exposure to nonionizing radiation: Secondary | ICD-10-CM | POA: Diagnosis not present

## 2013-03-18 DIAGNOSIS — L570 Actinic keratosis: Secondary | ICD-10-CM | POA: Diagnosis not present

## 2013-03-18 DIAGNOSIS — D485 Neoplasm of uncertain behavior of skin: Secondary | ICD-10-CM | POA: Diagnosis not present

## 2013-04-15 DIAGNOSIS — I4891 Unspecified atrial fibrillation: Secondary | ICD-10-CM | POA: Diagnosis not present

## 2013-05-19 DIAGNOSIS — I4891 Unspecified atrial fibrillation: Secondary | ICD-10-CM | POA: Diagnosis not present

## 2013-05-19 DIAGNOSIS — E119 Type 2 diabetes mellitus without complications: Secondary | ICD-10-CM | POA: Diagnosis not present

## 2013-05-19 DIAGNOSIS — I10 Essential (primary) hypertension: Secondary | ICD-10-CM | POA: Diagnosis not present

## 2013-06-04 DIAGNOSIS — Z125 Encounter for screening for malignant neoplasm of prostate: Secondary | ICD-10-CM | POA: Diagnosis not present

## 2013-06-04 DIAGNOSIS — E7200 Disorders of amino-acid transport, unspecified: Secondary | ICD-10-CM | POA: Diagnosis not present

## 2013-06-04 DIAGNOSIS — E7800 Pure hypercholesterolemia, unspecified: Secondary | ICD-10-CM | POA: Diagnosis not present

## 2013-06-04 DIAGNOSIS — Z0000 Encounter for general adult medical examination without abnormal findings: Secondary | ICD-10-CM | POA: Diagnosis not present

## 2013-06-04 DIAGNOSIS — I4891 Unspecified atrial fibrillation: Secondary | ICD-10-CM | POA: Diagnosis not present

## 2013-06-04 DIAGNOSIS — I10 Essential (primary) hypertension: Secondary | ICD-10-CM | POA: Diagnosis not present

## 2013-06-10 DIAGNOSIS — R7989 Other specified abnormal findings of blood chemistry: Secondary | ICD-10-CM | POA: Diagnosis not present

## 2013-06-16 DIAGNOSIS — G4730 Sleep apnea, unspecified: Secondary | ICD-10-CM | POA: Diagnosis not present

## 2013-06-16 DIAGNOSIS — I4892 Unspecified atrial flutter: Secondary | ICD-10-CM | POA: Diagnosis not present

## 2013-06-16 DIAGNOSIS — E119 Type 2 diabetes mellitus without complications: Secondary | ICD-10-CM | POA: Diagnosis not present

## 2013-06-16 DIAGNOSIS — I119 Hypertensive heart disease without heart failure: Secondary | ICD-10-CM | POA: Diagnosis not present

## 2013-06-24 DIAGNOSIS — I4891 Unspecified atrial fibrillation: Secondary | ICD-10-CM | POA: Diagnosis not present

## 2013-07-23 DIAGNOSIS — H2510 Age-related nuclear cataract, unspecified eye: Secondary | ICD-10-CM | POA: Diagnosis not present

## 2013-07-23 DIAGNOSIS — E119 Type 2 diabetes mellitus without complications: Secondary | ICD-10-CM | POA: Diagnosis not present

## 2013-07-29 DIAGNOSIS — I4891 Unspecified atrial fibrillation: Secondary | ICD-10-CM | POA: Diagnosis not present

## 2013-09-03 DIAGNOSIS — E7800 Pure hypercholesterolemia, unspecified: Secondary | ICD-10-CM | POA: Diagnosis not present

## 2013-09-03 DIAGNOSIS — I4891 Unspecified atrial fibrillation: Secondary | ICD-10-CM | POA: Diagnosis not present

## 2013-09-03 DIAGNOSIS — I10 Essential (primary) hypertension: Secondary | ICD-10-CM | POA: Diagnosis not present

## 2013-09-03 DIAGNOSIS — E119 Type 2 diabetes mellitus without complications: Secondary | ICD-10-CM | POA: Diagnosis not present

## 2013-09-10 DIAGNOSIS — M5137 Other intervertebral disc degeneration, lumbosacral region: Secondary | ICD-10-CM | POA: Diagnosis not present

## 2013-09-10 DIAGNOSIS — M5030 Other cervical disc degeneration, unspecified cervical region: Secondary | ICD-10-CM | POA: Diagnosis not present

## 2013-09-10 DIAGNOSIS — M999 Biomechanical lesion, unspecified: Secondary | ICD-10-CM | POA: Diagnosis not present

## 2013-09-10 DIAGNOSIS — M9981 Other biomechanical lesions of cervical region: Secondary | ICD-10-CM | POA: Diagnosis not present

## 2013-09-24 DIAGNOSIS — M9981 Other biomechanical lesions of cervical region: Secondary | ICD-10-CM | POA: Diagnosis not present

## 2013-09-24 DIAGNOSIS — M5137 Other intervertebral disc degeneration, lumbosacral region: Secondary | ICD-10-CM | POA: Diagnosis not present

## 2013-09-24 DIAGNOSIS — M999 Biomechanical lesion, unspecified: Secondary | ICD-10-CM | POA: Diagnosis not present

## 2013-09-24 DIAGNOSIS — M5030 Other cervical disc degeneration, unspecified cervical region: Secondary | ICD-10-CM | POA: Diagnosis not present

## 2013-09-25 DIAGNOSIS — M5030 Other cervical disc degeneration, unspecified cervical region: Secondary | ICD-10-CM | POA: Diagnosis not present

## 2013-09-25 DIAGNOSIS — M999 Biomechanical lesion, unspecified: Secondary | ICD-10-CM | POA: Diagnosis not present

## 2013-09-25 DIAGNOSIS — M9981 Other biomechanical lesions of cervical region: Secondary | ICD-10-CM | POA: Diagnosis not present

## 2013-09-25 DIAGNOSIS — M5137 Other intervertebral disc degeneration, lumbosacral region: Secondary | ICD-10-CM | POA: Diagnosis not present

## 2013-09-29 DIAGNOSIS — M999 Biomechanical lesion, unspecified: Secondary | ICD-10-CM | POA: Diagnosis not present

## 2013-09-29 DIAGNOSIS — M5137 Other intervertebral disc degeneration, lumbosacral region: Secondary | ICD-10-CM | POA: Diagnosis not present

## 2013-09-29 DIAGNOSIS — M5030 Other cervical disc degeneration, unspecified cervical region: Secondary | ICD-10-CM | POA: Diagnosis not present

## 2013-09-29 DIAGNOSIS — M9981 Other biomechanical lesions of cervical region: Secondary | ICD-10-CM | POA: Diagnosis not present

## 2013-09-30 DIAGNOSIS — I4891 Unspecified atrial fibrillation: Secondary | ICD-10-CM | POA: Diagnosis not present

## 2013-10-01 DIAGNOSIS — M999 Biomechanical lesion, unspecified: Secondary | ICD-10-CM | POA: Diagnosis not present

## 2013-10-01 DIAGNOSIS — M9981 Other biomechanical lesions of cervical region: Secondary | ICD-10-CM | POA: Diagnosis not present

## 2013-10-01 DIAGNOSIS — M5030 Other cervical disc degeneration, unspecified cervical region: Secondary | ICD-10-CM | POA: Diagnosis not present

## 2013-10-01 DIAGNOSIS — M5137 Other intervertebral disc degeneration, lumbosacral region: Secondary | ICD-10-CM | POA: Diagnosis not present

## 2013-10-02 DIAGNOSIS — M9981 Other biomechanical lesions of cervical region: Secondary | ICD-10-CM | POA: Diagnosis not present

## 2013-10-02 DIAGNOSIS — M5137 Other intervertebral disc degeneration, lumbosacral region: Secondary | ICD-10-CM | POA: Diagnosis not present

## 2013-10-02 DIAGNOSIS — M5030 Other cervical disc degeneration, unspecified cervical region: Secondary | ICD-10-CM | POA: Diagnosis not present

## 2013-10-02 DIAGNOSIS — M999 Biomechanical lesion, unspecified: Secondary | ICD-10-CM | POA: Diagnosis not present

## 2013-10-06 DIAGNOSIS — M5137 Other intervertebral disc degeneration, lumbosacral region: Secondary | ICD-10-CM | POA: Diagnosis not present

## 2013-10-06 DIAGNOSIS — M999 Biomechanical lesion, unspecified: Secondary | ICD-10-CM | POA: Diagnosis not present

## 2013-10-06 DIAGNOSIS — M9981 Other biomechanical lesions of cervical region: Secondary | ICD-10-CM | POA: Diagnosis not present

## 2013-10-06 DIAGNOSIS — M5030 Other cervical disc degeneration, unspecified cervical region: Secondary | ICD-10-CM | POA: Diagnosis not present

## 2013-10-08 DIAGNOSIS — M5030 Other cervical disc degeneration, unspecified cervical region: Secondary | ICD-10-CM | POA: Diagnosis not present

## 2013-10-08 DIAGNOSIS — M999 Biomechanical lesion, unspecified: Secondary | ICD-10-CM | POA: Diagnosis not present

## 2013-10-08 DIAGNOSIS — M9981 Other biomechanical lesions of cervical region: Secondary | ICD-10-CM | POA: Diagnosis not present

## 2013-10-08 DIAGNOSIS — M5137 Other intervertebral disc degeneration, lumbosacral region: Secondary | ICD-10-CM | POA: Diagnosis not present

## 2013-10-09 DIAGNOSIS — M9981 Other biomechanical lesions of cervical region: Secondary | ICD-10-CM | POA: Diagnosis not present

## 2013-10-09 DIAGNOSIS — M5030 Other cervical disc degeneration, unspecified cervical region: Secondary | ICD-10-CM | POA: Diagnosis not present

## 2013-10-09 DIAGNOSIS — M999 Biomechanical lesion, unspecified: Secondary | ICD-10-CM | POA: Diagnosis not present

## 2013-10-09 DIAGNOSIS — M5137 Other intervertebral disc degeneration, lumbosacral region: Secondary | ICD-10-CM | POA: Diagnosis not present

## 2013-10-13 DIAGNOSIS — M5137 Other intervertebral disc degeneration, lumbosacral region: Secondary | ICD-10-CM | POA: Diagnosis not present

## 2013-10-13 DIAGNOSIS — M9981 Other biomechanical lesions of cervical region: Secondary | ICD-10-CM | POA: Diagnosis not present

## 2013-10-13 DIAGNOSIS — M999 Biomechanical lesion, unspecified: Secondary | ICD-10-CM | POA: Diagnosis not present

## 2013-10-13 DIAGNOSIS — M5030 Other cervical disc degeneration, unspecified cervical region: Secondary | ICD-10-CM | POA: Diagnosis not present

## 2013-10-15 DIAGNOSIS — M5030 Other cervical disc degeneration, unspecified cervical region: Secondary | ICD-10-CM | POA: Diagnosis not present

## 2013-10-15 DIAGNOSIS — M9981 Other biomechanical lesions of cervical region: Secondary | ICD-10-CM | POA: Diagnosis not present

## 2013-10-15 DIAGNOSIS — M5137 Other intervertebral disc degeneration, lumbosacral region: Secondary | ICD-10-CM | POA: Diagnosis not present

## 2013-10-15 DIAGNOSIS — M999 Biomechanical lesion, unspecified: Secondary | ICD-10-CM | POA: Diagnosis not present

## 2013-10-16 DIAGNOSIS — M999 Biomechanical lesion, unspecified: Secondary | ICD-10-CM | POA: Diagnosis not present

## 2013-10-16 DIAGNOSIS — M5030 Other cervical disc degeneration, unspecified cervical region: Secondary | ICD-10-CM | POA: Diagnosis not present

## 2013-10-16 DIAGNOSIS — M5137 Other intervertebral disc degeneration, lumbosacral region: Secondary | ICD-10-CM | POA: Diagnosis not present

## 2013-10-16 DIAGNOSIS — M9981 Other biomechanical lesions of cervical region: Secondary | ICD-10-CM | POA: Diagnosis not present

## 2013-10-21 DIAGNOSIS — M5137 Other intervertebral disc degeneration, lumbosacral region: Secondary | ICD-10-CM | POA: Diagnosis not present

## 2013-10-21 DIAGNOSIS — M999 Biomechanical lesion, unspecified: Secondary | ICD-10-CM | POA: Diagnosis not present

## 2013-10-21 DIAGNOSIS — M9981 Other biomechanical lesions of cervical region: Secondary | ICD-10-CM | POA: Diagnosis not present

## 2013-10-21 DIAGNOSIS — M5030 Other cervical disc degeneration, unspecified cervical region: Secondary | ICD-10-CM | POA: Diagnosis not present

## 2013-10-22 DIAGNOSIS — M5030 Other cervical disc degeneration, unspecified cervical region: Secondary | ICD-10-CM | POA: Diagnosis not present

## 2013-10-22 DIAGNOSIS — M9981 Other biomechanical lesions of cervical region: Secondary | ICD-10-CM | POA: Diagnosis not present

## 2013-10-22 DIAGNOSIS — M5137 Other intervertebral disc degeneration, lumbosacral region: Secondary | ICD-10-CM | POA: Diagnosis not present

## 2013-10-22 DIAGNOSIS — M999 Biomechanical lesion, unspecified: Secondary | ICD-10-CM | POA: Diagnosis not present

## 2013-10-23 DIAGNOSIS — M5137 Other intervertebral disc degeneration, lumbosacral region: Secondary | ICD-10-CM | POA: Diagnosis not present

## 2013-10-23 DIAGNOSIS — M5030 Other cervical disc degeneration, unspecified cervical region: Secondary | ICD-10-CM | POA: Diagnosis not present

## 2013-10-23 DIAGNOSIS — M999 Biomechanical lesion, unspecified: Secondary | ICD-10-CM | POA: Diagnosis not present

## 2013-10-23 DIAGNOSIS — M9981 Other biomechanical lesions of cervical region: Secondary | ICD-10-CM | POA: Diagnosis not present

## 2013-10-27 DIAGNOSIS — M5137 Other intervertebral disc degeneration, lumbosacral region: Secondary | ICD-10-CM | POA: Diagnosis not present

## 2013-10-27 DIAGNOSIS — M5030 Other cervical disc degeneration, unspecified cervical region: Secondary | ICD-10-CM | POA: Diagnosis not present

## 2013-10-27 DIAGNOSIS — M9981 Other biomechanical lesions of cervical region: Secondary | ICD-10-CM | POA: Diagnosis not present

## 2013-10-27 DIAGNOSIS — M999 Biomechanical lesion, unspecified: Secondary | ICD-10-CM | POA: Diagnosis not present

## 2013-10-29 DIAGNOSIS — I4891 Unspecified atrial fibrillation: Secondary | ICD-10-CM | POA: Diagnosis not present

## 2013-10-30 DIAGNOSIS — M999 Biomechanical lesion, unspecified: Secondary | ICD-10-CM | POA: Diagnosis not present

## 2013-10-30 DIAGNOSIS — M5030 Other cervical disc degeneration, unspecified cervical region: Secondary | ICD-10-CM | POA: Diagnosis not present

## 2013-10-30 DIAGNOSIS — M9981 Other biomechanical lesions of cervical region: Secondary | ICD-10-CM | POA: Diagnosis not present

## 2013-10-30 DIAGNOSIS — M5137 Other intervertebral disc degeneration, lumbosacral region: Secondary | ICD-10-CM | POA: Diagnosis not present

## 2013-11-03 DIAGNOSIS — M5030 Other cervical disc degeneration, unspecified cervical region: Secondary | ICD-10-CM | POA: Diagnosis not present

## 2013-11-03 DIAGNOSIS — M5137 Other intervertebral disc degeneration, lumbosacral region: Secondary | ICD-10-CM | POA: Diagnosis not present

## 2013-11-03 DIAGNOSIS — M9981 Other biomechanical lesions of cervical region: Secondary | ICD-10-CM | POA: Diagnosis not present

## 2013-11-03 DIAGNOSIS — M999 Biomechanical lesion, unspecified: Secondary | ICD-10-CM | POA: Diagnosis not present

## 2013-11-05 DIAGNOSIS — M999 Biomechanical lesion, unspecified: Secondary | ICD-10-CM | POA: Diagnosis not present

## 2013-11-05 DIAGNOSIS — M9981 Other biomechanical lesions of cervical region: Secondary | ICD-10-CM | POA: Diagnosis not present

## 2013-11-05 DIAGNOSIS — Z23 Encounter for immunization: Secondary | ICD-10-CM | POA: Diagnosis not present

## 2013-11-05 DIAGNOSIS — M5030 Other cervical disc degeneration, unspecified cervical region: Secondary | ICD-10-CM | POA: Diagnosis not present

## 2013-11-05 DIAGNOSIS — I4891 Unspecified atrial fibrillation: Secondary | ICD-10-CM | POA: Diagnosis not present

## 2013-11-05 DIAGNOSIS — M5137 Other intervertebral disc degeneration, lumbosacral region: Secondary | ICD-10-CM | POA: Diagnosis not present

## 2013-11-06 DIAGNOSIS — M5030 Other cervical disc degeneration, unspecified cervical region: Secondary | ICD-10-CM | POA: Diagnosis not present

## 2013-11-06 DIAGNOSIS — M5137 Other intervertebral disc degeneration, lumbosacral region: Secondary | ICD-10-CM | POA: Diagnosis not present

## 2013-11-06 DIAGNOSIS — M9981 Other biomechanical lesions of cervical region: Secondary | ICD-10-CM | POA: Diagnosis not present

## 2013-11-06 DIAGNOSIS — M999 Biomechanical lesion, unspecified: Secondary | ICD-10-CM | POA: Diagnosis not present

## 2013-11-10 DIAGNOSIS — M5030 Other cervical disc degeneration, unspecified cervical region: Secondary | ICD-10-CM | POA: Diagnosis not present

## 2013-11-10 DIAGNOSIS — M999 Biomechanical lesion, unspecified: Secondary | ICD-10-CM | POA: Diagnosis not present

## 2013-11-10 DIAGNOSIS — M5137 Other intervertebral disc degeneration, lumbosacral region: Secondary | ICD-10-CM | POA: Diagnosis not present

## 2013-11-10 DIAGNOSIS — M9981 Other biomechanical lesions of cervical region: Secondary | ICD-10-CM | POA: Diagnosis not present

## 2013-11-17 DIAGNOSIS — M62452 Contracture of muscle, left thigh: Secondary | ICD-10-CM | POA: Diagnosis not present

## 2013-11-17 DIAGNOSIS — M5135 Other intervertebral disc degeneration, thoracolumbar region: Secondary | ICD-10-CM | POA: Diagnosis not present

## 2013-11-17 DIAGNOSIS — M9907 Segmental and somatic dysfunction of upper extremity: Secondary | ICD-10-CM | POA: Diagnosis not present

## 2013-11-17 DIAGNOSIS — M62830 Muscle spasm of back: Secondary | ICD-10-CM | POA: Diagnosis not present

## 2013-11-17 DIAGNOSIS — M5032 Other cervical disc degeneration, mid-cervical region: Secondary | ICD-10-CM | POA: Diagnosis not present

## 2013-11-17 DIAGNOSIS — M9903 Segmental and somatic dysfunction of lumbar region: Secondary | ICD-10-CM | POA: Diagnosis not present

## 2013-11-17 DIAGNOSIS — M9901 Segmental and somatic dysfunction of cervical region: Secondary | ICD-10-CM | POA: Diagnosis not present

## 2013-11-19 DIAGNOSIS — M5135 Other intervertebral disc degeneration, thoracolumbar region: Secondary | ICD-10-CM | POA: Diagnosis not present

## 2013-11-19 DIAGNOSIS — M9907 Segmental and somatic dysfunction of upper extremity: Secondary | ICD-10-CM | POA: Diagnosis not present

## 2013-11-19 DIAGNOSIS — M62830 Muscle spasm of back: Secondary | ICD-10-CM | POA: Diagnosis not present

## 2013-11-19 DIAGNOSIS — M9901 Segmental and somatic dysfunction of cervical region: Secondary | ICD-10-CM | POA: Diagnosis not present

## 2013-11-19 DIAGNOSIS — M9903 Segmental and somatic dysfunction of lumbar region: Secondary | ICD-10-CM | POA: Diagnosis not present

## 2013-11-19 DIAGNOSIS — M62452 Contracture of muscle, left thigh: Secondary | ICD-10-CM | POA: Diagnosis not present

## 2013-11-19 DIAGNOSIS — M5032 Other cervical disc degeneration, mid-cervical region: Secondary | ICD-10-CM | POA: Diagnosis not present

## 2013-11-24 DIAGNOSIS — M5032 Other cervical disc degeneration, mid-cervical region: Secondary | ICD-10-CM | POA: Diagnosis not present

## 2013-11-24 DIAGNOSIS — M9901 Segmental and somatic dysfunction of cervical region: Secondary | ICD-10-CM | POA: Diagnosis not present

## 2013-11-24 DIAGNOSIS — M5135 Other intervertebral disc degeneration, thoracolumbar region: Secondary | ICD-10-CM | POA: Diagnosis not present

## 2013-11-24 DIAGNOSIS — M9903 Segmental and somatic dysfunction of lumbar region: Secondary | ICD-10-CM | POA: Diagnosis not present

## 2013-11-24 DIAGNOSIS — M62452 Contracture of muscle, left thigh: Secondary | ICD-10-CM | POA: Diagnosis not present

## 2013-11-24 DIAGNOSIS — M62830 Muscle spasm of back: Secondary | ICD-10-CM | POA: Diagnosis not present

## 2013-11-24 DIAGNOSIS — M9907 Segmental and somatic dysfunction of upper extremity: Secondary | ICD-10-CM | POA: Diagnosis not present

## 2013-11-27 DIAGNOSIS — M5032 Other cervical disc degeneration, mid-cervical region: Secondary | ICD-10-CM | POA: Diagnosis not present

## 2013-11-27 DIAGNOSIS — M9901 Segmental and somatic dysfunction of cervical region: Secondary | ICD-10-CM | POA: Diagnosis not present

## 2013-11-27 DIAGNOSIS — M5135 Other intervertebral disc degeneration, thoracolumbar region: Secondary | ICD-10-CM | POA: Diagnosis not present

## 2013-11-27 DIAGNOSIS — M62830 Muscle spasm of back: Secondary | ICD-10-CM | POA: Diagnosis not present

## 2013-11-27 DIAGNOSIS — M9907 Segmental and somatic dysfunction of upper extremity: Secondary | ICD-10-CM | POA: Diagnosis not present

## 2013-11-27 DIAGNOSIS — M62452 Contracture of muscle, left thigh: Secondary | ICD-10-CM | POA: Diagnosis not present

## 2013-11-27 DIAGNOSIS — M9903 Segmental and somatic dysfunction of lumbar region: Secondary | ICD-10-CM | POA: Diagnosis not present

## 2013-12-01 DIAGNOSIS — M9907 Segmental and somatic dysfunction of upper extremity: Secondary | ICD-10-CM | POA: Diagnosis not present

## 2013-12-01 DIAGNOSIS — M9901 Segmental and somatic dysfunction of cervical region: Secondary | ICD-10-CM | POA: Diagnosis not present

## 2013-12-01 DIAGNOSIS — M62830 Muscle spasm of back: Secondary | ICD-10-CM | POA: Diagnosis not present

## 2013-12-01 DIAGNOSIS — M9903 Segmental and somatic dysfunction of lumbar region: Secondary | ICD-10-CM | POA: Diagnosis not present

## 2013-12-01 DIAGNOSIS — M5032 Other cervical disc degeneration, mid-cervical region: Secondary | ICD-10-CM | POA: Diagnosis not present

## 2013-12-01 DIAGNOSIS — M62452 Contracture of muscle, left thigh: Secondary | ICD-10-CM | POA: Diagnosis not present

## 2013-12-01 DIAGNOSIS — M5135 Other intervertebral disc degeneration, thoracolumbar region: Secondary | ICD-10-CM | POA: Diagnosis not present

## 2013-12-03 DIAGNOSIS — M62452 Contracture of muscle, left thigh: Secondary | ICD-10-CM | POA: Diagnosis not present

## 2013-12-03 DIAGNOSIS — M5135 Other intervertebral disc degeneration, thoracolumbar region: Secondary | ICD-10-CM | POA: Diagnosis not present

## 2013-12-03 DIAGNOSIS — I4891 Unspecified atrial fibrillation: Secondary | ICD-10-CM | POA: Diagnosis not present

## 2013-12-03 DIAGNOSIS — M9901 Segmental and somatic dysfunction of cervical region: Secondary | ICD-10-CM | POA: Diagnosis not present

## 2013-12-03 DIAGNOSIS — M62830 Muscle spasm of back: Secondary | ICD-10-CM | POA: Diagnosis not present

## 2013-12-03 DIAGNOSIS — M9907 Segmental and somatic dysfunction of upper extremity: Secondary | ICD-10-CM | POA: Diagnosis not present

## 2013-12-03 DIAGNOSIS — M5032 Other cervical disc degeneration, mid-cervical region: Secondary | ICD-10-CM | POA: Diagnosis not present

## 2013-12-03 DIAGNOSIS — M9903 Segmental and somatic dysfunction of lumbar region: Secondary | ICD-10-CM | POA: Diagnosis not present

## 2013-12-08 DIAGNOSIS — M62830 Muscle spasm of back: Secondary | ICD-10-CM | POA: Diagnosis not present

## 2013-12-08 DIAGNOSIS — M5135 Other intervertebral disc degeneration, thoracolumbar region: Secondary | ICD-10-CM | POA: Diagnosis not present

## 2013-12-08 DIAGNOSIS — M9903 Segmental and somatic dysfunction of lumbar region: Secondary | ICD-10-CM | POA: Diagnosis not present

## 2013-12-08 DIAGNOSIS — M9907 Segmental and somatic dysfunction of upper extremity: Secondary | ICD-10-CM | POA: Diagnosis not present

## 2013-12-08 DIAGNOSIS — M9901 Segmental and somatic dysfunction of cervical region: Secondary | ICD-10-CM | POA: Diagnosis not present

## 2013-12-08 DIAGNOSIS — M62452 Contracture of muscle, left thigh: Secondary | ICD-10-CM | POA: Diagnosis not present

## 2013-12-08 DIAGNOSIS — M5032 Other cervical disc degeneration, mid-cervical region: Secondary | ICD-10-CM | POA: Diagnosis not present

## 2013-12-09 DIAGNOSIS — B079 Viral wart, unspecified: Secondary | ICD-10-CM | POA: Diagnosis not present

## 2013-12-10 DIAGNOSIS — M5135 Other intervertebral disc degeneration, thoracolumbar region: Secondary | ICD-10-CM | POA: Diagnosis not present

## 2013-12-10 DIAGNOSIS — M5032 Other cervical disc degeneration, mid-cervical region: Secondary | ICD-10-CM | POA: Diagnosis not present

## 2013-12-10 DIAGNOSIS — M9901 Segmental and somatic dysfunction of cervical region: Secondary | ICD-10-CM | POA: Diagnosis not present

## 2013-12-10 DIAGNOSIS — M9907 Segmental and somatic dysfunction of upper extremity: Secondary | ICD-10-CM | POA: Diagnosis not present

## 2013-12-10 DIAGNOSIS — M9903 Segmental and somatic dysfunction of lumbar region: Secondary | ICD-10-CM | POA: Diagnosis not present

## 2013-12-10 DIAGNOSIS — M62452 Contracture of muscle, left thigh: Secondary | ICD-10-CM | POA: Diagnosis not present

## 2013-12-10 DIAGNOSIS — M62830 Muscle spasm of back: Secondary | ICD-10-CM | POA: Diagnosis not present

## 2013-12-11 DIAGNOSIS — M9901 Segmental and somatic dysfunction of cervical region: Secondary | ICD-10-CM | POA: Diagnosis not present

## 2013-12-11 DIAGNOSIS — M9903 Segmental and somatic dysfunction of lumbar region: Secondary | ICD-10-CM | POA: Diagnosis not present

## 2013-12-11 DIAGNOSIS — M5135 Other intervertebral disc degeneration, thoracolumbar region: Secondary | ICD-10-CM | POA: Diagnosis not present

## 2013-12-11 DIAGNOSIS — M62452 Contracture of muscle, left thigh: Secondary | ICD-10-CM | POA: Diagnosis not present

## 2013-12-11 DIAGNOSIS — M62830 Muscle spasm of back: Secondary | ICD-10-CM | POA: Diagnosis not present

## 2013-12-11 DIAGNOSIS — M9907 Segmental and somatic dysfunction of upper extremity: Secondary | ICD-10-CM | POA: Diagnosis not present

## 2013-12-11 DIAGNOSIS — B079 Viral wart, unspecified: Secondary | ICD-10-CM | POA: Diagnosis not present

## 2013-12-11 DIAGNOSIS — M5032 Other cervical disc degeneration, mid-cervical region: Secondary | ICD-10-CM | POA: Diagnosis not present

## 2013-12-15 DIAGNOSIS — I4891 Unspecified atrial fibrillation: Secondary | ICD-10-CM | POA: Diagnosis not present

## 2013-12-15 DIAGNOSIS — F339 Major depressive disorder, recurrent, unspecified: Secondary | ICD-10-CM | POA: Diagnosis not present

## 2013-12-15 DIAGNOSIS — E119 Type 2 diabetes mellitus without complications: Secondary | ICD-10-CM | POA: Diagnosis not present

## 2013-12-15 DIAGNOSIS — I10 Essential (primary) hypertension: Secondary | ICD-10-CM | POA: Diagnosis not present

## 2013-12-15 DIAGNOSIS — E785 Hyperlipidemia, unspecified: Secondary | ICD-10-CM | POA: Diagnosis not present

## 2014-01-02 DIAGNOSIS — I4891 Unspecified atrial fibrillation: Secondary | ICD-10-CM | POA: Diagnosis not present

## 2014-02-02 DIAGNOSIS — I4891 Unspecified atrial fibrillation: Secondary | ICD-10-CM | POA: Diagnosis not present

## 2014-02-02 DIAGNOSIS — I480 Paroxysmal atrial fibrillation: Secondary | ICD-10-CM | POA: Diagnosis not present

## 2014-02-02 DIAGNOSIS — I10 Essential (primary) hypertension: Secondary | ICD-10-CM | POA: Diagnosis not present

## 2014-03-05 DIAGNOSIS — I482 Chronic atrial fibrillation: Secondary | ICD-10-CM | POA: Diagnosis not present

## 2014-04-06 DIAGNOSIS — E119 Type 2 diabetes mellitus without complications: Secondary | ICD-10-CM | POA: Diagnosis not present

## 2014-04-06 DIAGNOSIS — I4891 Unspecified atrial fibrillation: Secondary | ICD-10-CM | POA: Diagnosis not present

## 2014-04-06 DIAGNOSIS — I10 Essential (primary) hypertension: Secondary | ICD-10-CM | POA: Diagnosis not present

## 2014-04-06 DIAGNOSIS — E785 Hyperlipidemia, unspecified: Secondary | ICD-10-CM | POA: Diagnosis not present

## 2014-04-06 DIAGNOSIS — I482 Chronic atrial fibrillation: Secondary | ICD-10-CM | POA: Diagnosis not present

## 2014-04-23 DIAGNOSIS — M5431 Sciatica, right side: Secondary | ICD-10-CM | POA: Diagnosis not present

## 2014-04-24 DIAGNOSIS — M5441 Lumbago with sciatica, right side: Secondary | ICD-10-CM | POA: Diagnosis not present

## 2014-05-05 DIAGNOSIS — I4891 Unspecified atrial fibrillation: Secondary | ICD-10-CM | POA: Diagnosis not present

## 2014-05-08 DIAGNOSIS — M47816 Spondylosis without myelopathy or radiculopathy, lumbar region: Secondary | ICD-10-CM | POA: Diagnosis not present

## 2014-05-28 DIAGNOSIS — M7138 Other bursal cyst, other site: Secondary | ICD-10-CM | POA: Diagnosis not present

## 2014-05-28 DIAGNOSIS — M5136 Other intervertebral disc degeneration, lumbar region: Secondary | ICD-10-CM | POA: Diagnosis not present

## 2014-06-09 DIAGNOSIS — M5416 Radiculopathy, lumbar region: Secondary | ICD-10-CM | POA: Diagnosis not present

## 2014-06-09 DIAGNOSIS — M47816 Spondylosis without myelopathy or radiculopathy, lumbar region: Secondary | ICD-10-CM | POA: Diagnosis not present

## 2014-06-09 DIAGNOSIS — M5136 Other intervertebral disc degeneration, lumbar region: Secondary | ICD-10-CM | POA: Diagnosis not present

## 2014-06-17 DIAGNOSIS — I482 Chronic atrial fibrillation: Secondary | ICD-10-CM | POA: Diagnosis not present

## 2014-06-17 DIAGNOSIS — E785 Hyperlipidemia, unspecified: Secondary | ICD-10-CM | POA: Diagnosis not present

## 2014-06-17 DIAGNOSIS — I10 Essential (primary) hypertension: Secondary | ICD-10-CM | POA: Diagnosis not present

## 2014-06-17 DIAGNOSIS — Z125 Encounter for screening for malignant neoplasm of prostate: Secondary | ICD-10-CM | POA: Diagnosis not present

## 2014-06-17 DIAGNOSIS — E119 Type 2 diabetes mellitus without complications: Secondary | ICD-10-CM | POA: Diagnosis not present

## 2014-06-17 DIAGNOSIS — Z0000 Encounter for general adult medical examination without abnormal findings: Secondary | ICD-10-CM | POA: Diagnosis not present

## 2014-06-24 DIAGNOSIS — Z96643 Presence of artificial hip joint, bilateral: Secondary | ICD-10-CM | POA: Diagnosis not present

## 2014-06-24 DIAGNOSIS — M25552 Pain in left hip: Secondary | ICD-10-CM | POA: Diagnosis not present

## 2014-06-24 DIAGNOSIS — S7002XA Contusion of left hip, initial encounter: Secondary | ICD-10-CM | POA: Diagnosis not present

## 2014-06-29 DIAGNOSIS — I480 Paroxysmal atrial fibrillation: Secondary | ICD-10-CM | POA: Diagnosis not present

## 2014-06-29 DIAGNOSIS — I951 Orthostatic hypotension: Secondary | ICD-10-CM | POA: Diagnosis not present

## 2014-06-29 DIAGNOSIS — G4733 Obstructive sleep apnea (adult) (pediatric): Secondary | ICD-10-CM | POA: Diagnosis not present

## 2014-07-07 DIAGNOSIS — I480 Paroxysmal atrial fibrillation: Secondary | ICD-10-CM | POA: Diagnosis not present

## 2014-07-07 DIAGNOSIS — I10 Essential (primary) hypertension: Secondary | ICD-10-CM | POA: Diagnosis not present

## 2014-07-07 DIAGNOSIS — G4733 Obstructive sleep apnea (adult) (pediatric): Secondary | ICD-10-CM | POA: Diagnosis not present

## 2014-07-27 ENCOUNTER — Encounter: Payer: Self-pay | Admitting: Family Medicine

## 2014-07-27 ENCOUNTER — Ambulatory Visit (INDEPENDENT_AMBULATORY_CARE_PROVIDER_SITE_OTHER): Payer: Medicare Other | Admitting: Family Medicine

## 2014-07-27 ENCOUNTER — Encounter (INDEPENDENT_AMBULATORY_CARE_PROVIDER_SITE_OTHER): Payer: Self-pay

## 2014-07-27 VITALS — BP 154/78 | HR 78 | Temp 97.9°F | Ht 73.0 in | Wt 208.0 lb

## 2014-07-27 DIAGNOSIS — I4891 Unspecified atrial fibrillation: Secondary | ICD-10-CM | POA: Insufficient documentation

## 2014-07-27 DIAGNOSIS — I482 Chronic atrial fibrillation, unspecified: Secondary | ICD-10-CM

## 2014-07-27 LAB — COAGUCHEK XS/INR WAIVED
INR: 2.6 — ABNORMAL HIGH (ref 0.9–1.1)
Prothrombin Time: 31.4 s

## 2014-07-27 NOTE — Assessment & Plan Note (Signed)
The current medical regimen is effective;  continue present plan and medications.  

## 2014-07-27 NOTE — Progress Notes (Signed)
   BP 154/78 mmHg  Pulse 78  Temp(Src) 97.9 F (36.6 C)  Ht $R'6\' 1"'CK$  (1.854 m)  Wt 208 lb (94.348 kg)  BMI 27.45 kg/m2  SpO2 99%   Subjective:    Patient ID: Sean Reyes, male    DOB: 03/24/1940, 74 y.o.   MRN: [id number]  CC: Coumadin management  HPI: This patient is a 74 y.o. male who presents for coumadin management. The expected duration of coumadin treatment is long term The reason for anticoagulation is  A. Fib.  Present Coumadin dose: Goal: 2.0-3.0 The patient does not have an active anticoagulation episode. Excessive bruising: yes. Patient fell Nose bleeding: no Rectal bleeding: no Prolonged menstrual cycles: N/A Eating diet with consistent amounts of foods containing Vitamin K:no Any recent antibiotic use? no  ROS: Per HPI unless specifically indicated above     Objective:    BP 154/78 mmHg  Pulse 78  Temp(Src) 97.9 F (36.6 C)  Ht $R'6\' 1"'xE$  (1.854 m)  Wt 208 lb (94.348 kg)  BMI 27.45 kg/m2  SpO2 99%  Wt Readings from Last 3 Encounters:  07/27/14 208 lb (94.348 kg)  06/17/14 204 lb (92.534 kg)     General: Well appearing, well nourished in no distress.  Normal mood and affect. Skin: No excessive bruising or rash  Last INR:     Last CBC: No results found for: WBC, HGB, HCT, MCV, PLT  Results for orders placed or performed in visit on 80/32/12  Basic metabolic panel  Result Value Ref Range   Glucose 120 (H) 65-99 mg/dL   BUN 14 7-18 mg/dL   Creatinine 1.27 0.60-1.30 mg/dL   Sodium 138 136-145 mmol/L   Potassium 3.9 3.5-5.1 mmol/L   Chloride 104 98-107 mmol/L   Co2 29 21-32 mmol/L   Calcium, Total 9.2 8.5-10.1 mg/dL   Osmolality 277 275-301   Anion Gap 5 (L) 7-16   EGFR (African American) >60    EGFR (Non-African Amer.) 57 (L)   Protime-INR  Result Value Ref Range   Prothrombin Time 29.3 (H) 11.5-14.7 secs   INR 2.9        Assessment:     ICD-9-CM ICD-10-CM   1. Chronic atrial fibrillation 427.31 I48.2 CoaguChek XS/INR Waived    Plan:    Discussed current plan face-to-face with patient. For coumadin dosing, elected to continue current dose. Will plan to recheck INR in 1 month.

## 2014-08-08 ENCOUNTER — Emergency Department
Admission: EM | Admit: 2014-08-08 | Discharge: 2014-08-09 | Disposition: A | Discharge status: Home / Self Care | Payer: Medicare Other | Attending: Emergency Medicine | Admitting: Emergency Medicine

## 2014-08-08 ENCOUNTER — Other Ambulatory Visit: Payer: Self-pay

## 2014-08-08 ENCOUNTER — Emergency Department: Payer: Medicare Other

## 2014-08-08 ENCOUNTER — Encounter: Payer: Self-pay | Admitting: Emergency Medicine

## 2014-08-08 DIAGNOSIS — R0600 Dyspnea, unspecified: Secondary | ICD-10-CM | POA: Insufficient documentation

## 2014-08-08 DIAGNOSIS — Z7901 Long term (current) use of anticoagulants: Secondary | ICD-10-CM | POA: Insufficient documentation

## 2014-08-08 DIAGNOSIS — I10 Essential (primary) hypertension: Secondary | ICD-10-CM | POA: Insufficient documentation

## 2014-08-08 DIAGNOSIS — Z79899 Other long term (current) drug therapy: Secondary | ICD-10-CM | POA: Insufficient documentation

## 2014-08-08 DIAGNOSIS — R0602 Shortness of breath: Secondary | ICD-10-CM | POA: Diagnosis not present

## 2014-08-08 DIAGNOSIS — E119 Type 2 diabetes mellitus without complications: Secondary | ICD-10-CM | POA: Insufficient documentation

## 2014-08-08 DIAGNOSIS — I4892 Unspecified atrial flutter: Secondary | ICD-10-CM | POA: Diagnosis not present

## 2014-08-08 DIAGNOSIS — I4891 Unspecified atrial fibrillation: Secondary | ICD-10-CM | POA: Diagnosis present

## 2014-08-08 LAB — COMPREHENSIVE METABOLIC PANEL
ALT: 20 U/L (ref 17–63)
AST: 24 U/L (ref 15–41)
Albumin: 3.9 g/dL (ref 3.5–5.0)
Alkaline Phosphatase: 46 U/L (ref 38–126)
Anion gap: 8 (ref 5–15)
BUN: 25 mg/dL — ABNORMAL HIGH (ref 6–20)
CO2: 30 mmol/L (ref 22–32)
Calcium: 9.7 mg/dL (ref 8.9–10.3)
Chloride: 101 mmol/L (ref 101–111)
Creatinine, Ser: 1.23 mg/dL (ref 0.61–1.24)
GFR calc Af Amer: >60 mL/min (ref >60)
GFR calc non Af Amer: 57 mL/min — ABNORMAL LOW (ref >60)
Glucose, Bld: 167 mg/dL — ABNORMAL HIGH (ref 65–99)
Potassium: 4.1 mmol/L (ref 3.5–5.1)
Sodium: 139 mmol/L (ref 135–145)
Total Bilirubin: 0.4 mg/dL (ref 0.3–1.2)
Total Protein: 7 g/dL (ref 6.5–8.1)

## 2014-08-08 LAB — CBC
HCT: 41.5 % (ref 40.0–52.0)
Hemoglobin: 14.1 g/dL (ref 13.0–18.0)
MCH: 32.3 pg (ref 26.0–34.0)
MCHC: 34 g/dL (ref 32.0–36.0)
MCV: 95.2 fL (ref 80.0–100.0)
Platelets: 159 10*3/uL (ref 150–440)
RBC: 4.36 MIL/uL — ABNORMAL LOW (ref 4.40–5.90)
RDW: 14.4 % (ref 11.5–14.5)
WBC: 8.2 10*3/uL (ref 3.8–10.6)

## 2014-08-08 LAB — PROTIME-INR
INR: 2.39
Prothrombin Time: 26.2 seconds — ABNORMAL HIGH (ref 11.4–15.0)

## 2014-08-08 LAB — TROPONIN I: Troponin I: <0.03 ng/mL (ref <0.031)

## 2014-08-08 MED ORDER — METOPROLOL TARTRATE 1 MG/ML IV SOLN
5.0000 mg | INTRAVENOUS | Status: AC
  Administered 2014-08-09: 5 mg via INTRAVENOUS

## 2014-08-08 MED ORDER — SODIUM CHLORIDE 0.9 % IV BOLUS (SEPSIS)
500.0000 mL | INTRAVENOUS | Status: AC
  Administered 2014-08-09: 500 mL via INTRAVENOUS

## 2014-08-08 NOTE — ED Provider Notes (Signed)
[facility] Emergency Department Provider Note  ____________________________________________  Time seen: Approximately 11:12 PM  I have reviewed the triage vital signs and the nursing notes.   HISTORY  Chief Complaint Atrial Fibrillation    HPI Sean Reyes is a 74 y.o. male with a history of atrial fibrillation status post ablation 2 who takes warfarin and he sees Dr. Nehemiah Massed for cardiology follow-up.  He presents with several days of feelings of palpitation and dyspnea particularly with exertion.  He describes the onset of the symptoms is gradual, intermittent, and waxing and waning when present.  He states that as long as he is resting the palpitations are mild but with any sort of exertion they increase.  He denies chest pain and vomiting.  He has had some nausea.  He has no history of coronary artery disease.  He has diabetes but takes oral medication only.  Of note, he states that he lowered his own dose of metoprolol because of feelings of lethargy; he felt that the metoprolol was too much and making him tired, and he wanted to try less of a dose.  He notes that the feelings of palpitations got worse after he lowered his dose.  He has had similar episodes in the past which is when Dr. Nehemiah Massed doubled his dose of metoprolol to 50 mg of metoprolol succinate daily.  He also does have metoprolol tartrate 25 mg available to use when feeling these symptoms, but he has not taken any tonight.   Past Medical History  Diagnosis Date  . Atrial fibrillation   . S/P ablation of atrial fibrillation     Ablative therapy  . Hypertension   . Tachycardia, unspecified   . Diabetes mellitus   . Hyperlipidemia   . Anxiety   . Depression     Patient Active Problem List   Diagnosis Date Noted  . Atrial fibrillation 07/27/2014    Past Surgical History  Procedure Laterality Date  . Joint replacement Bilateral     hips  . Tonsillectomy    . Hernia repair       Current Outpatient Rx  Name  Route  Sig  Dispense  Refill  . benazepril (LOTENSIN) 40 MG tablet   Oral   Take 40 mg by mouth daily.         . hydrochlorothiazide (HYDRODIURIL) 12.5 MG tablet   Oral   Take 12.5 mg by mouth daily.         . metFORMIN (GLUCOPHAGE-XR) 500 MG 24 hr tablet   Oral   Take 500 mg by mouth 2 (two) times daily.         . metoprolol succinate (TOPROL-XL) 25 MG 24 hr tablet   Oral   Take by mouth.         . propafenone (RYTHMOL SR) 425 MG 12 hr capsule   Oral   Take 425 mg by mouth 2 (two) times daily.         . tamsulosin (FLOMAX) 0.4 MG CAPS capsule   Oral   Take 0.4 mg by mouth.         . warfarin (COUMADIN) 1 MG tablet   Oral   Take 0.5 mg by mouth every other day. In addition to 4mg  daily dose         . warfarin (COUMADIN) 4 MG tablet   Oral   Take 4 mg by mouth daily.         [name] LORazepam (ATIVAN) 1 MG tablet  Oral   Take 1 mg by mouth daily as needed for anxiety.         . metoprolol succinate (TOPROL-XL) 25 MG 24 hr tablet   Oral   Take 25 mg by mouth daily.           Allergies Levaquin  Family History  Problem Relation Age of Onset  . Cancer Mother     Social History History  Substance Use Topics  . Smoking status: Never Smoker   . Smokeless tobacco: Never Used  . Alcohol Use: No    Review of Systems Constitutional: No fever/chills Eyes: No visual changes. ENT: No sore throat. Cardiovascular: Denies chest pain. Respiratory: Dyspnea with exertion. Gastrointestinal: No abdominal pain.  Nausea, no vomiting.  No diarrhea.  No constipation. Genitourinary: Negative for dysuria. Musculoskeletal: Negative for back pain. Skin: Negative for rash. Neurological: Negative for headaches, focal weakness or numbness.  10-point ROS otherwise negative.  ____________________________________________   PHYSICAL EXAM:  VITAL SIGNS: ED Triage Vitals  Enc Vitals Group     BP 08/08/14 2131 143/114 mmHg      Pulse Rate 08/08/14 2131 73     Resp 08/08/14 2131 20     Temp 08/08/14 2131 98.1 F (36.7 C)     Temp Source 08/08/14 2131 Oral     SpO2 08/08/14 2131 98 %     Weight 08/08/14 2123 208 lb (94.348 kg)     Height 08/08/14 2123 6\' 1"  (1.854 m)     Head Cir --      Peak Flow --      Pain Score 08/08/14 2135 0     Pain Loc --      Pain Edu? --      Excl. in [place]? --     Constitutional: Alert and oriented. Well appearing and in no acute distress.  Appears younger than stated age. Eyes: Conjunctivae are normal. PERRL. EOMI. Head: Atraumatic. Nose: No congestion/rhinnorhea. Mouth/Throat: Mucous membranes are moist.  Oropharynx non-erythematous. Neck: No stridor.   Cardiovascular: Variable rate, ranging from the 80s to the 140s with an irregularly irregular rhythm. Grossly normal heart sounds.  Good peripheral circulation. Respiratory: Normal respiratory effort.  No retractions. Lungs CTAB. Gastrointestinal: Soft and nontender. No distention. No abdominal bruits. No CVA tenderness. Musculoskeletal: No lower extremity tenderness nor edema.  No joint effusions. Neurologic:  Normal speech and language. No gross focal neurologic deficits are appreciated. Speech is normal. Skin:  Skin is warm, dry and intact. No rash noted. Psychiatric: Mood and affect are normal. Speech and behavior are normal.  ____________________________________________   LABS (all labs ordered are listed, but only abnormal results are displayed)  Labs Reviewed  CBC - Abnormal; Notable for the following:    RBC 4.36 (*)    All other components within normal limits  COMPREHENSIVE METABOLIC PANEL - Abnormal; Notable for the following:    Glucose, Bld 167 (*)    BUN 25 (*)    GFR calc non Af Amer 57 (*)    All other components within normal limits  TROPONIN I  PROTIME-INR   ____________________________________________  EKG  ED ECG REPORT I, Evelyne Makepeace, the attending physician, personally viewed and  interpreted this ECG.   Date: 08/08/2014  EKG Time: 21:29  Rate: 120  Rhythm: A flutter with variable AV block  Axis: Normal  Intervals:Abnormal, a flutter  ST&T Change: Non-specific ST segment / T-wave changes, but no evidence of acute ischemia.   ____________________________________________  RADIOLOGY  I,  [facility] Providencia Hottenstein, personally viewed and evaluated these images as part of my medical decision making.   Dg Chest 2 View  08/08/2014   CLINICAL DATA:  Shortness of breath, intermittent atrial fibrillation  EXAM: CHEST  2 VIEW  COMPARISON:  None.  FINDINGS: Lungs are clear.  No pleural effusion or pneumothorax.  The heart is normal in size.  Degenerative changes of the visualized thoracolumbar spine.  IMPRESSION: No evidence of acute cardiopulmonary disease.   Electronically Signed   By: Julian Hy M.D.   On: 08/08/2014 22:08    ____________________________________________   PROCEDURES  Procedure(s) performed: None  Critical Care performed: No ____________________________________________   INITIAL IMPRESSION / ASSESSMENT AND PLAN / ED COURSE  Pertinent labs & imaging results that were available during my care of the patient were reviewed by me and considered in my medical decision making (see chart for details).  The patient is very well-appearing and in no acute distress.  His laboratory workup was generally reassuring, including a negative troponin.  His heart rate elevated when I entered the room, but I was observing him on the monitor prior to my entrance and he was in the 80s consistently.  He and his family are very reasonable and I discussed with them the plan to stabilize his heart rate with metoprolol 5 mg IV and then likely provide another dose of metoprolol succinate for long acting care.  He agrees that given that he has no chest pain and a negative troponin and no risk of coronary artery disease that he is at low risk of a cardiac arrest or potentially life  threatening results of his atrial fibrillation.  I will follow-up his INR and reassess after providing medication.  (Note that documentation was delayed due to multiple ED patients requiring immediate care.)  The patient's INR is therapeutic.  He has felt much better after the metoprolol and his heart rate has maintained in the 70s.  After the dose of metoprolol 5 mg IV a gave him a dose of metoprolol succinate 25 mg by mouth.  I again discussed the possibility of admission but we all agree that follow-up as an outpatient with cardiology is appropriate.  I gave my usual and customary return precautions.  ____________________________________________  FINAL CLINICAL IMPRESSION(S) / ED DIAGNOSES  Final diagnoses:  Atrial flutter with rapid ventricular response      NEW MEDICATIONS STARTED DURING THIS VISIT:  Discharge Medication List as of 08/09/2014 12:55 AM       Hinda Kehr, MD 08/09/14 [phone]

## 2014-08-08 NOTE — ED Notes (Signed)
Pt presents to ER stating hx of intermittent afib. Pt alert and in NAD. Pt reports weakness, SOB.

## 2014-08-09 DIAGNOSIS — I4892 Unspecified atrial flutter: Secondary | ICD-10-CM | POA: Diagnosis not present

## 2014-08-09 MED ORDER — METOPROLOL SUCCINATE ER 50 MG PO TB24
ORAL_TABLET | ORAL | Status: AC
  Administered 2014-08-09: 25 mg via ORAL
  Filled 2014-08-09: qty 1

## 2014-08-09 MED ORDER — METOPROLOL SUCCINATE ER 50 MG PO TB24
25.0000 mg | ORAL_TABLET | ORAL | Status: AC
  Administered 2014-08-09: 25 mg via ORAL

## 2014-08-09 MED ORDER — METOPROLOL TARTRATE 1 MG/ML IV SOLN
INTRAVENOUS | Status: AC
  Administered 2014-08-09: 5 mg via INTRAVENOUS
  Filled 2014-08-09: qty 5

## 2014-08-09 NOTE — Discharge Instructions (Signed)
As we discussed, your workup was reassuring today, and with an extra dose of IV metoprolol and oral metoprolol succinate 25 mg, your heart rate is well controlled.  We recommend that you continue taking your normal doses of metoprolol at home and take an additional dose of fast acting metoprolol as needed and as prescribed.  Follow-up at the next available opportunity with your cardiologist.  Return to the emergency department if he develop new or worsening symptoms that concern you.  Of note, your INR today was 2.39 which is appropriate.   Atrial Flutter Atrial flutter is a heart rhythm that can cause the heart to beat very fast (tachycardia). It originates in the upper chambers of the heart (atria). In atrial flutter, the top chambers of the heart (atria) often beat much faster than the bottom chambers of the heart (ventricles). Atrial flutter has a regular "saw toothed" appearance in an EKG readout. An EKG is a test that records the electrical activity of the heart. Atrial flutter can cause the heart to beat up to 150 beats per minute (BPM). Atrial flutter can either be short lived (paroxysmal) or permanent.  CAUSES  Causes of atrial flutter can be many. Some of these include:  Heart related issues:  Heart attack (myocardial infarction).  Heart failure.  Heart valve problems.  Poorly controlled high blood pressure (hypertension).  After open heart surgery.  Lung related issues:  A blood clot in the lungs (pulmonary embolism).  Chronic obstructive pulmonary disease (COPD). Medications used to treat COPD can attribute to atrial flutter.  Other related causes:  Hyperthyroidism.  Caffeine.  Some decongestant cold medications.  Low electrolyte levels such as potassium or magnesium.  Cocaine. SYMPTOMS  An awareness of your heart beating rapidly (palpitations).  Shortness of breath.  Chest pain.  Low blood pressure (hypotension).  Dizziness or fainting. DIAGNOSIS    Different tests can be performed to diagnose atrial flutter.   An EKG.  Holter monitor. This is a 24-hour recording of your heart rhythm. You will also be given a diary. Write down all symptoms that you have and what you were doing at the time you experienced symptoms.  Cardiac event monitor. This small device can be worn for up to 30 days. When you have heart symptoms, you will push a button on the device. This will then record your heart rhythm.  Echocardiogram. This is an imaging test to look at your heart. Your caregiver will look at your heart valves and the ventricles.  Stress test. This test can help determine if the atrial flutter is related to exercise or if coronary artery disease is present.  Laboratory studies will look at certain blood levels like:  Complete blood count (CBC).  Potassium.  Magnesium.  Thyroid function. TREATMENT  Treatment of atrial flutter varies. A combination of therapies may be used or sometimes atrial flutter may need only 1 type of treatment.  Lab work: If your blood work, such as your electrolytes (potassium, magnesium) or your thyroid function tests, are abnormal, your caregiver will treat them accordingly.  Medication:  There are several different types of medications that can convert your heart to a normal rhythm and prevent atrial flutter from reoccurring.  Nonsurgical procedures: Nonsurgical techniques may be used to control atrial flutter. Some examples include:  Cardioversion. This technique uses either drugs or an electrical shock to restore a normal heart rhythm:  Cardioversion drugs may be given through an intravenous (IV) line to help "reset" the heart rhythm.  In  electrical cardioversion, your caregiver shocks your heart with electrical energy. This helps to reset the heartbeat to a normal rhythm.  Ablation. If atrial flutter is a persistent problem, an ablation may be needed. This procedure is done under mild sedation. High  frequency radio-wave energy is used to destroy the area of heart tissue responsible for atrial flutter. SEEK IMMEDIATE MEDICAL CARE IF:  You have:  Dizziness.  Near fainting or fainting.  Shortness of breath.  Chest pain or pressure.  Sudden nausea or vomiting.  Profuse sweating. If you have the above symptoms, call your local emergency service immediately! Do not drive yourself to the hospital. MAKE SURE YOU:   Understand these instructions.  Will watch your condition.  Will get help right away if you are not doing well or get worse. Document Released: 06/18/2008 Document Revised: 06/16/2013 Document Reviewed: 06/18/2008 [facility] Patient Information 2015 [place], [place]. This information is not intended to replace advice given to you by your health care provider. Make sure you discuss any questions you have with your health care provider.

## 2014-08-10 DIAGNOSIS — I480 Paroxysmal atrial fibrillation: Secondary | ICD-10-CM | POA: Diagnosis not present

## 2014-08-10 DIAGNOSIS — I951 Orthostatic hypotension: Secondary | ICD-10-CM | POA: Diagnosis not present

## 2014-08-10 DIAGNOSIS — I10 Essential (primary) hypertension: Secondary | ICD-10-CM | POA: Diagnosis not present

## 2014-08-10 DIAGNOSIS — G4733 Obstructive sleep apnea (adult) (pediatric): Secondary | ICD-10-CM | POA: Diagnosis not present

## 2014-08-19 ENCOUNTER — Telehealth: Payer: Self-pay

## 2014-08-19 NOTE — Telephone Encounter (Signed)
RX refill request [address] Lorazepam 1mg  daily prn Last seen: 06/17/14 to follow up in 63months Seen by you for sciatica: 04/23/14 told to increase lorazepam for four days because he could not take other medication Last RX: 05/18/14 30w/1refill Next Appt: 08/26/14 and 09/28/14

## 2014-08-20 MED ORDER — LORAZEPAM 1 MG PO TABS: 1.0000 mg | ORAL_TABLET | Freq: Every day | ORAL | Status: DC | PRN

## 2014-08-20 NOTE — Telephone Encounter (Signed)
Rx printed and up front. Will walk to [employer] today.

## 2014-08-21 DIAGNOSIS — R05 Cough: Secondary | ICD-10-CM | POA: Diagnosis not present

## 2014-08-21 DIAGNOSIS — J0110 Acute frontal sinusitis, unspecified: Secondary | ICD-10-CM | POA: Diagnosis not present

## 2014-08-25 DIAGNOSIS — R0602 Shortness of breath: Secondary | ICD-10-CM | POA: Diagnosis not present

## 2014-08-26 ENCOUNTER — Ambulatory Visit (INDEPENDENT_AMBULATORY_CARE_PROVIDER_SITE_OTHER): Payer: Medicare Other | Admitting: Family Medicine

## 2014-08-26 ENCOUNTER — Encounter: Payer: Self-pay | Admitting: Family Medicine

## 2014-08-26 VITALS — BP 119/68 | HR 59 | Temp 97.9°F | Ht 71.3 in | Wt 204.0 lb

## 2014-08-26 DIAGNOSIS — I482 Chronic atrial fibrillation, unspecified: Secondary | ICD-10-CM

## 2014-08-26 DIAGNOSIS — L989 Disorder of the skin and subcutaneous tissue, unspecified: Secondary | ICD-10-CM | POA: Diagnosis not present

## 2014-08-26 DIAGNOSIS — I4819 Other persistent atrial fibrillation: Secondary | ICD-10-CM

## 2014-08-26 DIAGNOSIS — I481 Persistent atrial fibrillation: Secondary | ICD-10-CM

## 2014-08-26 LAB — COAGUCHEK XS/INR WAIVED
INR: 2.3 — ABNORMAL HIGH (ref 0.9–1.1)
Prothrombin Time: 27.1 s

## 2014-08-26 NOTE — Progress Notes (Signed)
BP 119/68 mmHg  Pulse 59  Temp(Src) 97.9 F (36.6 C)  Ht 5' 11.3" (1.811 m)  Wt 204 lb (92.534 kg)  BMI 28.21 kg/m2  SpO2 98%   Subjective:    Patient ID: Sean Reyes, male    DOB: 1940/07/13, 74 y.o.   MRN: [id number]  HPI: Sean Reyes is a 74 y.o. male  Chief Complaint  Patient presents with  . Atrial Fibrillation  . leg lesion    right leg   Doing well with all medicine. Takes everyday with no side effects. Stable from last visit. Reviewed medical problems with pt and stable No bleeding or brusing  Pt with chronic painful lesion rt ant shin area treated successfully in past with surgitron destruction of nerve endings pt wants done again as has gotten very painful.  Relevant past medical, surgical, family and social history reviewed and updated as indicated. Interim medical history since our last visit reviewed. Allergies and medications reviewed and updated.  Review of Systems  Constitutional: Negative.   Respiratory: Negative.   Cardiovascular: Negative.   Skin: Negative.     Per HPI unless specifically indicated above     Objective:    BP 119/68 mmHg  Pulse 59  Temp(Src) 97.9 F (36.6 C)  Ht 5' 11.3" (1.811 m)  Wt 204 lb (92.534 kg)  BMI 28.21 kg/m2  SpO2 98%  Wt Readings from Last 3 Encounters:  08/26/14 204 lb (92.534 kg)  08/08/14 208 lb (94.348 kg)  07/27/14 208 lb (94.348 kg)    Physical Exam  Constitutional: He is oriented to person, place, and time. He appears well-developed and well-nourished. No distress.  HENT:  Head: Normocephalic and atraumatic.  Right Ear: Hearing normal.  Left Ear: Hearing normal.  Nose: Nose normal.  Eyes: Conjunctivae and lids are normal. Right eye exhibits no discharge. Left eye exhibits no discharge. No scleral icterus.  Pulmonary/Chest: Effort normal. No respiratory distress.  Musculoskeletal: Normal range of motion.  Neurological: He is alert and oriented to person, place, and time.  Skin: Skin is  intact. No rash noted.  Lesion on leg prepped etoh and betadine injected after marking area 1-2cm patch with zylo with epi and surgitron destruction of marked area. Pt ed given on wound care  Psychiatric: He has a normal mood and affect. His speech is normal and behavior is normal. Judgment and thought content normal. Cognition and memory are normal.    Results for orders placed or performed during the hospital encounter of 08/08/14  CBC  Result Value Ref Range   WBC 8.2 3.8 - 10.6 K/uL   RBC 4.36 (L) 4.40 - 5.90 MIL/uL   Hemoglobin 14.1 13.0 - 18.0 g/dL   HCT 41.5 40.0 - 52.0 %   MCV 95.2 80.0 - 100.0 fL   MCH 32.3 26.0 - 34.0 pg   MCHC 34.0 32.0 - 36.0 g/dL   RDW 14.4 11.5 - 14.5 %   Platelets 159 150 - 440 K/uL  Comprehensive metabolic panel  Result Value Ref Range   Sodium 139 135 - 145 mmol/L   Potassium 4.1 3.5 - 5.1 mmol/L   Chloride 101 101 - 111 mmol/L   CO2 30 22 - 32 mmol/L   Glucose, Bld 167 (H) 65 - 99 mg/dL   BUN 25 (H) 6 - 20 mg/dL   Creatinine, Ser 1.23 0.61 - 1.24 mg/dL   Calcium 9.7 8.9 - 10.3 mg/dL   Total Protein 7.0 6.5 - 8.1 g/dL   Albumin 3.9  3.5 - 5.0 g/dL   AST 24 15 - 41 U/L   ALT 20 17 - 63 U/L   Alkaline Phosphatase 46 38 - 126 U/L   Total Bilirubin 0.4 0.3 - 1.2 mg/dL   GFR calc non Af Amer 57 (L) >60 mL/min   GFR calc Af Amer >60 >60 mL/min   Anion gap 8 5 - 15  Troponin I  Result Value Ref Range   Troponin I <0.03 <0.031 ng/mL  Protime-INR - (only if patient is taking Coumadin)  Result Value Ref Range   Prothrombin Time 26.2 (H) 11.4 - 15.0 seconds   INR 2.39       Assessment & Plan:   Problem List Items Addressed This Visit      Cardiovascular and Mediastinum   Atrial fibrillation    The current medical regimen is effective;  continue present plan and medications.        Other Visit Diagnoses    Chronic a-fib    -  Primary    Relevant Orders    CoaguChek XS/INR Waived    Skin lesion of right lower extremity         destruction of lesion        Follow up plan: Return in about 4 weeks (around 09/23/2014) for INR.

## 2014-08-26 NOTE — Assessment & Plan Note (Signed)
The current medical regimen is effective;  continue present plan and medications.  

## 2014-09-02 DIAGNOSIS — I10 Essential (primary) hypertension: Secondary | ICD-10-CM | POA: Insufficient documentation

## 2014-09-02 DIAGNOSIS — I480 Paroxysmal atrial fibrillation: Secondary | ICD-10-CM | POA: Diagnosis not present

## 2014-09-02 DIAGNOSIS — G4733 Obstructive sleep apnea (adult) (pediatric): Secondary | ICD-10-CM | POA: Diagnosis not present

## 2014-09-23 ENCOUNTER — Ambulatory Visit (INDEPENDENT_AMBULATORY_CARE_PROVIDER_SITE_OTHER): Payer: Medicare Other | Admitting: Family Medicine

## 2014-09-23 ENCOUNTER — Encounter: Payer: Self-pay | Admitting: Family Medicine

## 2014-09-23 VITALS — BP 125/79 | HR 61 | Temp 97.9°F | Wt 205.0 lb

## 2014-09-23 DIAGNOSIS — I482 Chronic atrial fibrillation, unspecified: Secondary | ICD-10-CM

## 2014-09-23 LAB — COAGUCHEK XS/INR WAIVED
INR: 1.3 — ABNORMAL HIGH (ref 0.9–1.1)
Prothrombin Time: 15.7 s

## 2014-09-23 NOTE — Progress Notes (Signed)
HPI  BP 125/79 mmHg  Pulse 61  Temp(Src) 97.9 F (36.6 C)  Wt 205 lb (92.987 kg)  SpO2 98%   Subjective:    Patient ID: Sean Reyes, male    DOB: 1940/02/16, 74 y.o.   MRN: [id number]  CC: Coumadin management  HPI: This patient is a 74 y.o. male who presents for coumadin management. The expected duration of coumadin treatment is lifelong The reason for anticoagulation is  A. Fib. He is going to have and epidural injection today and needs to have a his results faxed to neurosurgery.  Present Coumadin dose:Patient has not taken his coumadin since Saturday, he has a procedure scheduled for this afternoon. Fax PT/INR results to [phone]  Goal: 2.0-3.0  Excessive bruising: no Nose bleeding: no Rectal bleeding: no Prolonged menstrual cycles: N/A Eating diet with consistent amounts of foods containing Vitamin K:yes Any recent antibiotic use? yes 2000 mg Amoxicillin on Friday 8/5  ROS: Per HPI unless specifically indicated above     Objective:    BP 125/79 mmHg  Pulse 61  Temp(Src) 97.9 F (36.6 C)  Wt 205 lb (92.987 kg)  SpO2 98%  Wt Readings from Last 3 Encounters:  09/23/14 205 lb (92.987 kg)  08/26/14 204 lb (92.534 kg)  08/08/14 208 lb (94.348 kg)     General: Well appearing, well nourished in no distress.  Normal mood and affect. Skin: No excessive bruising or rash  Last INR: 1.3 Pt: 15.7    Last CBC:  Lab Results  Component Value Date   WBC 8.2 08/08/2014   HGB 14.1 08/08/2014   HCT 41.5 08/08/2014   MCV 95.2 08/08/2014   PLT 159 08/08/2014    Results for orders placed or performed in visit on 08/26/14  CoaguChek XS/INR Waived  Result Value Ref Range   INR 2.3 (H) 0.9 - 1.1   Prothrombin Time 27.1 sec       Assessment:     ICD-9-CM ICD-10-CM   1. Chronic atrial fibrillation 427.31 I48.2 CoaguChek XS/INR Waived    Plan:   Discussed current plan face-to-face with patient. For coumadin dosing, elected to hold dose for his procedure and will  restart his medication when approved by the neurosurgeon. Will plan to recheck INR in 1 week at his wellness with Dr. Jeananne Rama. Advised patient that we will fax his results over, but that he will need to discuss with neurosurgeon to see if they will be able to do the injection today with a 1.3.   Review of Systems   Physical Exam

## 2014-09-24 ENCOUNTER — Other Ambulatory Visit: Payer: Self-pay | Admitting: Family Medicine

## 2014-09-24 ENCOUNTER — Other Ambulatory Visit: Payer: Self-pay | Admitting: Unknown Physician Specialty

## 2014-09-25 ENCOUNTER — Encounter: Payer: Self-pay | Admitting: Family Medicine

## 2014-09-25 ENCOUNTER — Ambulatory Visit (INDEPENDENT_AMBULATORY_CARE_PROVIDER_SITE_OTHER): Payer: Medicare Other | Admitting: Family Medicine

## 2014-09-25 DIAGNOSIS — I482 Chronic atrial fibrillation, unspecified: Secondary | ICD-10-CM

## 2014-09-25 LAB — COAGUCHEK XS/INR WAIVED
INR: 1.1 (ref 0.9–1.1)
Prothrombin Time: 13.6 s

## 2014-09-25 NOTE — Progress Notes (Signed)
  BP 143/79 mmHg  Pulse 61  Temp(Src) 98.6 F (37 C)  Wt 205 lb 6.4 oz (93.169 kg)  SpO2 98%   Subjective:    Patient ID: Sean Reyes, male    DOB: Oct 23, 1940, 74 y.o.   MRN: [id number]  CC: Coumadin management  HPI: This patient is a 74 y.o. male who presents for coumadin management. The expected duration of coumadin treatment is lifelong The reason for anticoagulation is  A. Fib. To have an epidural injection done, but needs his INR to be 1.0. Has been on hold for 5 days now.  Present Coumadin dose: on hold for a procedure Goal: 2.0-3.0, 1.0 now due to procedure.   Excessive bruising: no Nose bleeding: no Rectal bleeding: no Prolonged menstrual cycles: N/A Eating diet with consistent amounts of foods containing Vitamin K:yes Any recent antibiotic use? no  ROS: Per HPI unless specifically indicated above     Objective:    BP 143/79 mmHg  Pulse 61  Temp(Src) 98.6 F (37 C)  Wt 205 lb 6.4 oz (93.169 kg)  SpO2 98%  Wt Readings from Last 3 Encounters:  09/25/14 205 lb 6.4 oz (93.169 kg)  09/23/14 205 lb (92.987 kg)  08/26/14 204 lb (92.534 kg)     General: Well appearing, well nourished in no distress.  Normal mood and affect. Skin: No excessive bruising or rash  Last INR: 1.1 Last PT: 13.6    Last CBC:  Lab Results  Component Value Date   WBC 8.2 08/08/2014   HGB 14.1 08/08/2014   HCT 41.5 08/08/2014   MCV 95.2 08/08/2014   PLT 159 08/08/2014    Results for orders placed or performed in visit on 09/23/14  CoaguChek XS/INR Waived  Result Value Ref Range   INR 1.3 (H) 0.9 - 1.1   Prothrombin Time 15.7 sec       Assessment:     ICD-9-CM ICD-10-CM   1. Chronic atrial fibrillation 427.31 I48.2 Protime-INR     CoaguChek XS/INR Waived    Plan:   Discussed current plan face-to-face with patient. For coumadin dosing, elected to hold coumadin until at 1.0 so he can get his epidural and then restart medication per neurosurgery following injection.

## 2014-09-28 ENCOUNTER — Encounter: Payer: Self-pay | Admitting: Family Medicine

## 2014-09-28 ENCOUNTER — Ambulatory Visit (INDEPENDENT_AMBULATORY_CARE_PROVIDER_SITE_OTHER): Payer: Medicare Other | Admitting: Family Medicine

## 2014-09-28 VITALS — BP 93/56 | HR 66 | Temp 97.9°F | Ht 72.0 in | Wt 202.0 lb

## 2014-09-28 DIAGNOSIS — I482 Chronic atrial fibrillation, unspecified: Secondary | ICD-10-CM

## 2014-09-28 DIAGNOSIS — E119 Type 2 diabetes mellitus without complications: Secondary | ICD-10-CM | POA: Diagnosis not present

## 2014-09-28 DIAGNOSIS — I152 Hypertension secondary to endocrine disorders: Secondary | ICD-10-CM | POA: Insufficient documentation

## 2014-09-28 DIAGNOSIS — E1159 Type 2 diabetes mellitus with other circulatory complications: Secondary | ICD-10-CM | POA: Insufficient documentation

## 2014-09-28 DIAGNOSIS — I10 Essential (primary) hypertension: Secondary | ICD-10-CM

## 2014-09-28 DIAGNOSIS — E1143 Type 2 diabetes mellitus with diabetic autonomic (poly)neuropathy: Secondary | ICD-10-CM | POA: Insufficient documentation

## 2014-09-28 LAB — BAYER DCA HB A1C WAIVED: HB A1C (BAYER DCA - WAIVED): 6.5 % (ref <7.0)

## 2014-09-28 MED ORDER — APIXABAN 5 MG PO TABS: 5.0000 mg | ORAL_TABLET | Freq: Two times a day (BID) | ORAL | Status: DC

## 2014-09-28 NOTE — Assessment & Plan Note (Signed)
Diabetes doing well will check during low feeling spells blood sugar otherwise continue current care weight loss exercise etc.

## 2014-09-28 NOTE — Assessment & Plan Note (Signed)
Discussed blood pressure being low, and congratulations! We will decrease medication and check blood pressure gave written directions to start with hydrochlorothiazide, followed by benazepril 40 mg to half a tablet of 20 mg. Observe symptoms blood pressure also during any low feelings spells check blood sugar to make sure no hypoglycemic episodes.

## 2014-09-28 NOTE — Progress Notes (Signed)
BP 93/56 mmHg  Pulse 66  Temp(Src) 97.9 F (36.6 C)  Ht 6' (1.829 m)  Wt 202 lb (91.627 kg)  BMI 27.39 kg/m2  SpO2 99%   Subjective:    Patient ID: Sean Reyes, male    DOB: [birth year], 74 y.o.   MRN: [id number]  HPI: Sean Reyes is a 74 y.o. male  Chief Complaint  Patient presents with  . Diabetes  . Atrial Fibrillation   patient multiple concerns diabetes is been doing well with no issues or problems..  Blood pressure staying good sometimes gets too low patient feels draggy. Patient has not checked his blood sugar during this time.  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 Cellulitis has resolved.  Patient's A. fib is been a problem since been scheduled to have from pain clinic a shot in his back had to be canceled because his INR was not low enough. Stopped his warfarin 3 days prior to procedure.    Relevant past medical, surgical, family and social history reviewed and updated as indicated. Interim medical history since our last visit reviewed. Allergies and medications reviewed and updated.  Review of Systems  Constitutional: Negative.   Respiratory: Negative.   Cardiovascular: Negative.     Per HPI unless specifically indicated above     Objective:    BP 93/56 mmHg  Pulse 66  Temp(Src) 97.9 F (36.6 C)  Ht 6' (1.829 m)  Wt 202 lb (91.627 kg)  BMI 27.39 kg/m2  SpO2 99%  Wt Readings from Last 3 Encounters:  09/28/14 202 lb (91.627 kg)  09/25/14 205 lb 6.4 oz (93.169 kg)  09/23/14 205 lb (92.987 kg)    Physical Exam  Results for orders placed or performed in visit on 09/25/14  CoaguChek XS/INR Waived  Result Value Ref Range   INR 1.1 0.9 - 1.1   Prothrombin Time 13.6 sec      Assessment & Plan:   Problem List Items Addressed This Visit      Cardiovascular and Mediastinum   Atrial fibrillation    Discuss warfarin and procedures. We will stop warfarin August 19 start  Eliquis 5 mg 1 in the morning 1 in the evening. On August 29 do not take Eliquis. August 30 procedure, and if all okay start Eliquis and warfarin August 31. Take both until September 6 and check INR. If INR between 2 and 3 stopped Eliquis.       Relevant Medications   apixaban (ELIQUIS) 5 MG TABS tablet   Hypertension    Discussed blood pressure being low, and congratulations! We will decrease medication and check blood pressure gave written directions to start with hydrochlorothiazide, followed by benazepril 40 mg to half a tablet of 20 mg. Observe symptoms blood pressure also during any low feelings spells check blood sugar to make sure no hypoglycemic episodes.      Relevant Medications   apixaban (ELIQUIS) 5 MG TABS tablet     Endocrine   Diabetes mellitus without complication - Primary    Diabetes doing well will check during low feeling spells blood sugar otherwise continue current care weight loss exercise etc.      Relevant Orders   Bayer DCA Hb A1c Waived     for patient's leg symptoms discuss starting Zostrix cream after skin completely healed. Patient will need BMP, A1c, Follow up plan: Return in about 3 months (around 12/29/2014), or if symptoms worsen or fail to  improve, for 3 months diabetes recheck.

## 2014-09-28 NOTE — Assessment & Plan Note (Signed)
Discuss warfarin and procedures. We will stop warfarin August 19 start Eliquis 5 mg 1 in the morning 1 in the evening. On August 29 do not take Eliquis. August 30 procedure, and if all okay start Eliquis and warfarin August 31. Take both until September 6 and check INR. If INR between 2 and 3 stopped Eliquis.

## 2014-10-12 DIAGNOSIS — E119 Type 2 diabetes mellitus without complications: Secondary | ICD-10-CM | POA: Diagnosis not present

## 2014-10-12 DIAGNOSIS — H2513 Age-related nuclear cataract, bilateral: Secondary | ICD-10-CM | POA: Diagnosis not present

## 2014-10-12 LAB — HM DIABETES EYE EXAM

## 2014-10-13 ENCOUNTER — Encounter: Payer: Self-pay | Admitting: Family Medicine

## 2014-10-13 ENCOUNTER — Ambulatory Visit (INDEPENDENT_AMBULATORY_CARE_PROVIDER_SITE_OTHER): Payer: Medicare Other | Admitting: Family Medicine

## 2014-10-13 VITALS — BP 136/73 | HR 65 | Temp 97.8°F | Ht 72.0 in | Wt 202.0 lb

## 2014-10-13 DIAGNOSIS — Z6827 Body mass index (BMI) 27.0-27.9, adult: Secondary | ICD-10-CM | POA: Diagnosis not present

## 2014-10-13 DIAGNOSIS — I482 Chronic atrial fibrillation, unspecified: Secondary | ICD-10-CM

## 2014-10-13 DIAGNOSIS — F419 Anxiety disorder, unspecified: Secondary | ICD-10-CM | POA: Diagnosis not present

## 2014-10-13 DIAGNOSIS — M5416 Radiculopathy, lumbar region: Secondary | ICD-10-CM | POA: Diagnosis not present

## 2014-10-13 DIAGNOSIS — I10 Essential (primary) hypertension: Secondary | ICD-10-CM

## 2014-10-13 LAB — COAGUCHEK XS/INR WAIVED
INR: 1 (ref 0.9–1.1)
Prothrombin Time: 11.7 s

## 2014-10-13 MED ORDER — LORAZEPAM 1 MG PO TABS: 1.0000 mg | ORAL_TABLET | Freq: Every day | ORAL | Status: DC | PRN

## 2014-10-13 NOTE — Assessment & Plan Note (Signed)
The current medical regimen is effective;  continue present plan and medications.  

## 2014-10-13 NOTE — Progress Notes (Signed)
BP 136/73 mmHg  Pulse 65  Temp(Src) 97.8 F (36.6 C)  Ht 6' (1.829 m)  Wt 202 lb (91.627 kg)  BMI 27.39 kg/m2  SpO2 99%   Subjective:    Patient ID: Sean Hacker., male    DOB: [birth year], 74 y.o.   MRN: [id number]  HPI: Sean Guerette. is a 74 y.o. male  Chief Complaint  Patient presents with  . Anticoagulation   patient doing well off medications on has been off warfarin for over a week. Has been taken Eliquis without problems did not take yesterday or today. Has procedure scheduled later today.  INR drawn just now is 1.1 and normal.  Blood pressures doing well Blood sugars mid 100 range Doing okay with medications  Chronic anxiety doing better on half a milligram lorazepam each night sleeps better and able to get better rest.  Relevant past medical, surgical, family and social history reviewed and updated as indicated. Interim medical history since our last visit reviewed. Allergies and medications reviewed and updated.  Review of Systems  Constitutional: Negative.   Respiratory: Negative.   Cardiovascular: Negative.     Per HPI unless specifically indicated above     Objective:    BP 136/73 mmHg  Pulse 65  Temp(Src) 97.8 F (36.6 C)  Ht 6' (1.829 m)  Wt 202 lb (91.627 kg)  BMI 27.39 kg/m2  SpO2 99%  Wt Readings from Last 3 Encounters:  10/13/14 202 lb (91.627 kg)  09/28/14 202 lb (91.627 kg)  09/25/14 205 lb 6.4 oz (93.169 kg)    Physical Exam  Constitutional: He is oriented to person, place, and time. He appears well-developed and well-nourished. No distress.  HENT:  Head: Normocephalic and atraumatic.  Right Ear: Hearing normal.  Left Ear: Hearing normal.  Nose: Nose normal.  Eyes: Conjunctivae and lids are normal. Right eye exhibits no discharge. Left eye exhibits no discharge. No scleral icterus.  Cardiovascular: Normal rate, regular rhythm and normal heart sounds.   Pulmonary/Chest: Effort normal and breath sounds normal. No respiratory  distress.  Musculoskeletal: Normal range of motion.  Neurological: He is alert and oriented to person, place, and time.  Skin: Skin is intact. No rash noted. No erythema.  Psychiatric: He has a normal mood and affect. His speech is normal and behavior is normal. Judgment and thought content normal. Cognition and memory are normal.    Results for orders placed or performed in visit on 09/28/14  Bayer DCA Hb A1c Waived  Result Value Ref Range   Bayer DCA Hb A1c Waived 6.5 <7.0 %      Assessment & Plan:   Problem List Items Addressed This Visit      Cardiovascular and Mediastinum   Atrial fibrillation - Primary    Discussed transitioning care again with patient or medications will continue Eliquis starting tomorrow after surgery. Patient will also restart usual dose of warfarin. He will take both together and recheck INR in 9 days if therapeutic INR will stop Eliquis.      Relevant Orders   CoaguChek XS/INR Waived   Hypertension    .The current medical regimen is effective;  continue present plan and medications.         Other   Anxiety    Patient with situational anxiety taking half a lorazepam at bedtime will continue to give prescription. Hopefully problem will resolve next year.      Relevant Medications   LORazepam (ATIVAN) 1 MG tablet  Follow up plan: Return for 9 days INR.

## 2014-10-13 NOTE — Assessment & Plan Note (Signed)
Patient with situational anxiety taking half a lorazepam at bedtime will continue to give prescription. Hopefully problem will resolve next year.

## 2014-10-13 NOTE — Assessment & Plan Note (Signed)
Discussed transitioning care again with patient or medications will continue Eliquis starting tomorrow after surgery. Patient will also restart usual dose of warfarin. He will take both together and recheck INR in 9 days if therapeutic INR will stop Eliquis.

## 2014-10-22 ENCOUNTER — Ambulatory Visit (INDEPENDENT_AMBULATORY_CARE_PROVIDER_SITE_OTHER): Payer: Medicare Other | Admitting: Family Medicine

## 2014-10-22 ENCOUNTER — Encounter: Payer: Self-pay | Admitting: Family Medicine

## 2014-10-22 VITALS — BP 128/79 | HR 61 | Temp 97.3°F | Wt 202.0 lb

## 2014-10-22 DIAGNOSIS — I4891 Unspecified atrial fibrillation: Secondary | ICD-10-CM

## 2014-10-22 DIAGNOSIS — Z7901 Long term (current) use of anticoagulants: Secondary | ICD-10-CM

## 2014-10-22 DIAGNOSIS — I482 Chronic atrial fibrillation, unspecified: Secondary | ICD-10-CM

## 2014-10-22 DIAGNOSIS — I480 Paroxysmal atrial fibrillation: Secondary | ICD-10-CM | POA: Diagnosis not present

## 2014-10-22 LAB — COAGUCHEK XS/INR WAIVED
INR: 1.6 — ABNORMAL HIGH (ref 0.9–1.1)
Prothrombin Time: 19.2 s

## 2014-10-22 MED ORDER — APIXABAN 5 MG PO TABS: 5.0000 mg | ORAL_TABLET | Freq: Two times a day (BID) | ORAL | Status: DC

## 2014-10-22 NOTE — Patient Instructions (Addendum)
Please do call Dr. Dema Severin and find out how soon you can have your procedure so we can plan for you to stop your Eliquis 72 hours before your procedure Your last dose of Coumadin / warfarin was Sept 7th STOP the Coumadin / warfarin completely for now Continue the Eliquis for now as you are taking it If you develop any bleeding episodes, seek immediate medical care We'll chat later today after I reach Dr. Nehemiah Massed Make sure we talk about when to restart your Eliquis OR warfarin after your procedure; call Dr. Jeananne Rama about that

## 2014-10-22 NOTE — Progress Notes (Signed)
Subjective:    Patient ID: Sean Reyes., male    DOB: Jul 02, 1940, 74 y.o.   MRN: [id number]  HPI   BP 128/79 mmHg  Pulse 61  Temp(Src) 97.3 F (36.3 C)  Wt 202 lb (91.627 kg)  SpO2 99%   Subjective:    Patient ID: Sean Reyes., male    DOB: 1940/02/17, 74 y.o.   MRN: [id number]  CC: Coumadin management  HPI: This patient is a 74 y.o. male who presents for coumadin management. The expected duration of coumadin treatment is lifelong The reason for anticoagulation is  A. Fib.  Present Coumadin dose:alternates 4mg  and 4.5mg  he is also currently on Eliquis Goal: 2.0-3.0 The patient does not have an active anticoagulation episode. Excessive bruising: no Nose bleeding: no Rectal bleeding: no Prolonged menstrual cycles: N/A Eating diet with consistent amounts of foods containing Vitamin K:yes Any recent antibiotic use? no   I asked the patient why he is on Eliquis and coumadin; he says his primary doctor told him to take both He was supposed to have an epidural He got off of warfarin to get the procedure done, and then had to reschedule He would just as soon be put on eliquis and just not be on that for 24 hours before the procedure He went up there again, on the table for the procedure and he had been off of warfarin 5-7 days and then off of eliquis for 24 hours The procedural doctor said their procedure said he had to be off of 72 hours Last dose of coumadin was yesterday, and he says that his doctor told him to be on BOTH Eliquis and warfarin Dr. Nehemiah Massed is his cardiologist; he has been in normal rhythm for a while, for 3-4 months Dr. Dema Severin with [facility] is going to do his procedure He is aware that stopping an anticoagulant puts him at risk for stroke  ROS: Per HPI unless specifically indicated above     Objective:    BP 128/79 mmHg  Pulse 61  Temp(Src) 97.3 F (36.3 C)  Wt 202 lb (91.627 kg)  SpO2 99%  Wt Readings from Last 3  Encounters:  10/22/14 202 lb (91.627 kg)  10/13/14 202 lb (91.627 kg)  09/28/14 202 lb (91.627 kg)    General: Well appearing, well nourished in no distress.  Normal mood and affect. Heart: normal rate and rhythm Skin: No excessive bruising or rash Lungs: clear to auscultation Neuro: alert Psych: pleasant, cooperative, euthymic  Last CBC:  Lab Results  Component Value Date   WBC 8.2 08/08/2014   HGB 14.1 08/08/2014   HCT 41.5 08/08/2014   MCV 95.2 08/08/2014   PLT 159 08/08/2014   INR 1.6 today      Assessment:  And Plan:   ICD-9-CM ICD-10-CM   1. Atrial fibrillation, unspecified 427.31 I48.91 CoaguChek XS/INR Waived  2. Chronic atrial fibrillation 427.31 I48.2 apixaban (ELIQUIS) 5 MG TABS tablet  3. AF (paroxysmal atrial fibrillation) 427.31 I48.0   4. Chronic anticoagulation V58.61 Z79.01    Problem List Items Addressed This Visit      Cardiovascular and Mediastinum   Atrial fibrillation - Primary   Relevant Medications   apixaban (ELIQUIS) 5 MG TABS tablet   Other Relevant Orders   CoaguChek XS/INR Waived (Completed)   AF (paroxysmal atrial fibrillation)    Followed by cardiologist; patient sounds to be in normal sinus rhythm today and says he has been in sinus rhythm for 3-4 months; I  will send a note to his cardiologist to see if he has any problem whatsoever stopping his anticoagulation prior to epidural per pain clinic protocol; I do not; talked with patient about the risk of stroke with cessation of blood thinner and he says he is aware of this      Relevant Medications   apixaban (ELIQUIS) 5 MG TABS tablet     Other   Chronic anticoagulation    For intermittent atrial fibrillation; I cannot see any reason at all why patient should be on both Coumadin (warfarin) and Eliquis; discussed risk of bleeding; STOP Coumadin (warfarin); sending message to his cardiologist to see if he has any reservation at all about holding Eliquis for 72 hours prior to procedure; I  would like to avoid bridging if possible if he is truly in intermittent a-fib; patient aware of risk of stroke with cessation of blood thinner; he would start back after the procedure per pain clinic protocol        Plan:   Review of Systems     Objective:   Physical Exam      Assessment & Plan:  An after-visit summary was printed and given to the patient at [place].  Please see the patient instructions which may contain other information and recommendations beyond what is mentioned above in the assessment and plan. Meds ordered this encounter  Medications  . apixaban (ELIQUIS) 5 MG TABS tablet    Sig: Take 1 tablet (5 mg total) by mouth 2 (two) times daily.    Dispense:  60 tablet    Refill:  1   Medications Discontinued During This Encounter  Medication Reason  . warfarin (COUMADIN) 1 MG tablet Discontinued by provider  . warfarin (COUMADIN) 4 MG tablet Discontinued by provider  . metoprolol succinate (TOPROL-XL) 25 MG 24 hr tablet Duplicate  . apixaban (ELIQUIS) 5 MG TABS tablet Reorder

## 2014-10-26 DIAGNOSIS — Z7901 Long term (current) use of anticoagulants: Secondary | ICD-10-CM | POA: Insufficient documentation

## 2014-10-26 NOTE — Assessment & Plan Note (Signed)
Followed by cardiologist; patient sounds to be in normal sinus rhythm today and says he has been in sinus rhythm for 3-4 months; I will send a note to his cardiologist to see if he has any problem whatsoever stopping his anticoagulation prior to epidural per pain clinic protocol; I do not; talked with patient about the risk of stroke with cessation of blood thinner and he says he is aware of this

## 2014-10-26 NOTE — Assessment & Plan Note (Signed)
For intermittent atrial fibrillation; I cannot see any reason at all why patient should be on both Coumadin (warfarin) and Eliquis; discussed risk of bleeding; STOP Coumadin (warfarin); sending message to his cardiologist to see if he has any reservation at all about holding Eliquis for 72 hours prior to procedure; I would like to avoid bridging if possible if he is truly in intermittent a-fib; patient aware of risk of stroke with cessation of blood thinner; he would start back after the procedure per pain clinic protocol

## 2014-11-24 DIAGNOSIS — M5416 Radiculopathy, lumbar region: Secondary | ICD-10-CM | POA: Diagnosis not present

## 2014-11-24 DIAGNOSIS — Z6827 Body mass index (BMI) 27.0-27.9, adult: Secondary | ICD-10-CM | POA: Diagnosis not present

## 2014-11-24 DIAGNOSIS — I10 Essential (primary) hypertension: Secondary | ICD-10-CM | POA: Diagnosis not present

## 2014-11-26 ENCOUNTER — Ambulatory Visit: Payer: Medicare Other | Admitting: Family Medicine

## 2014-12-01 ENCOUNTER — Encounter: Payer: Self-pay | Admitting: Family Medicine

## 2014-12-01 ENCOUNTER — Ambulatory Visit (INDEPENDENT_AMBULATORY_CARE_PROVIDER_SITE_OTHER): Payer: Medicare Other | Admitting: Family Medicine

## 2014-12-01 VITALS — BP 139/78 | HR 68 | Temp 98.0°F | Ht 72.0 in | Wt 206.0 lb

## 2014-12-01 DIAGNOSIS — Z23 Encounter for immunization: Secondary | ICD-10-CM | POA: Diagnosis not present

## 2014-12-01 DIAGNOSIS — I10 Essential (primary) hypertension: Secondary | ICD-10-CM | POA: Diagnosis not present

## 2014-12-01 DIAGNOSIS — I480 Paroxysmal atrial fibrillation: Secondary | ICD-10-CM

## 2014-12-01 MED ORDER — WARFARIN SODIUM 4 MG PO TABS: 4.0000 mg | ORAL_TABLET | Freq: Every day | ORAL | Status: DC

## 2014-12-01 MED ORDER — WARFARIN SODIUM 1 MG PO TABS: 0.5000 mg | ORAL_TABLET | Freq: Every day | ORAL | Status: DC

## 2014-12-01 NOTE — Assessment & Plan Note (Signed)
The current medical regimen is effective;  continue present plan and medications.  

## 2014-12-01 NOTE — Assessment & Plan Note (Signed)
Discuss switch over from Eliquis to warfarin will discuss continue Eliquis after INR close to 2. We'll start warfarin 8 mg today and tomorrow then go to 4 mg alternating with 4-1/2 mg Recheck PT/INR in 8-9 days

## 2014-12-01 NOTE — Progress Notes (Signed)
BP 139/78 mmHg  Pulse 68  Temp(Src) 98 F (36.7 C)  Ht 6' (1.829 m)  Wt 206 lb (93.441 kg)  BMI 27.93 kg/m2  SpO2 99%   Subjective:    Patient ID: Sean Hacker., male    DOB: 1940-07-16, 74 y.o.   MRN: [id number]  HPI: Sean Loadholt. is a 74 y.o. male  Chief Complaint  Patient presents with  . Atrial Fibrillation    discuss medication   Patient is been on Eliquis essentially without problems had previously been on warfarin 4 mg alternating with 4-1/2 mg and doing well. Warfarin was changed to Eliquis to accommodate back injections which patient has had 2 injections to date and is really no better to slightly worse. Patient ready to get back on warfarin Patient with no bruising bleeding or other complaints Relevant past medical, surgical, family and social history reviewed and updated as indicated. Interim medical history since our last visit reviewed. Allergies and medications reviewed and updated.  Review of Systems  Constitutional: Negative.   Respiratory: Negative.   Cardiovascular: Negative.     Per HPI unless specifically indicated above     Objective:    BP 139/78 mmHg  Pulse 68  Temp(Src) 98 F (36.7 C)  Ht 6' (1.829 m)  Wt 206 lb (93.441 kg)  BMI 27.93 kg/m2  SpO2 99%  Wt Readings from Last 3 Encounters:  12/01/14 206 lb (93.441 kg)  10/22/14 202 lb (91.627 kg)  10/13/14 202 lb (91.627 kg)    Physical Exam  Constitutional: He is oriented to person, place, and time. He appears well-developed and well-nourished. No distress.  HENT:  Head: Normocephalic and atraumatic.  Right Ear: Hearing normal.  Left Ear: Hearing normal.  Nose: Nose normal.  Eyes: Conjunctivae and lids are normal. Right eye exhibits no discharge. Left eye exhibits no discharge. No scleral icterus.  Cardiovascular: Normal heart sounds.   Pulmonary/Chest: Effort normal and breath sounds normal. No respiratory distress.  Musculoskeletal: Normal range of motion.  Neurological:  He is alert and oriented to person, place, and time.  Skin: Skin is intact. No rash noted.  Psychiatric: He has a normal mood and affect. His speech is normal and behavior is normal. Judgment and thought content normal. Cognition and memory are normal.    Results for orders placed or performed in visit on 10/22/14  CoaguChek XS/INR Waived  Result Value Ref Range   INR 1.6 (H) 0.9 - 1.1   Prothrombin Time 19.2 sec      Assessment & Plan:   Problem List Items Addressed This Visit      Cardiovascular and Mediastinum   Hypertension    The current medical regimen is effective;  continue present plan and medications.       Relevant Medications   warfarin (COUMADIN) 1 MG tablet   warfarin (COUMADIN) 4 MG tablet   AF (paroxysmal atrial fibrillation) (HCC)    Discuss switch over from Eliquis to warfarin will discuss continue Eliquis after INR close to 2. We'll start warfarin 8 mg today and tomorrow then go to 4 mg alternating with 4-1/2 mg Recheck PT/INR in 8-9 days      Relevant Medications   warfarin (COUMADIN) 1 MG tablet   warfarin (COUMADIN) 4 MG tablet    Other Visit Diagnoses    Immunization due    -  Primary    Relevant Orders    Flu Vaccine QUAD 36+ mos PF IM (Fluarix & Fluzone Quad PF) (Completed)  Follow up plan: Return in about 8 days (around 12/09/2014) for PT INR.

## 2014-12-07 ENCOUNTER — Telehealth: Payer: Self-pay | Admitting: Family Medicine

## 2014-12-07 ENCOUNTER — Other Ambulatory Visit: Payer: Self-pay | Admitting: Family Medicine

## 2014-12-07 MED ORDER — APIXABAN 5 MG PO TABS: 5.0000 mg | ORAL_TABLET | Freq: Two times a day (BID) | ORAL | Status: DC

## 2014-12-07 NOTE — Telephone Encounter (Signed)
Pt came by stated he needs a refill on Eloquist. Pt stated he ran out this morning. Pharm is [facility]. Pt asks if this can be sent today as he took the last one this morning. Thanks.

## 2014-12-07 NOTE — Telephone Encounter (Signed)
Patient came in asking for a refill on his eloquis. I do not see eloquis under his medications but I do see warfarin. Should I call the pt and ask him to clarify, or reorder warfarin.

## 2014-12-07 NOTE — Addendum Note (Signed)
Addended byGolden Pop on: 12/07/2014 05:23 PM   Modules accepted: Orders, Medications

## 2014-12-08 DIAGNOSIS — M5416 Radiculopathy, lumbar region: Secondary | ICD-10-CM | POA: Diagnosis not present

## 2014-12-08 DIAGNOSIS — M4806 Spinal stenosis, lumbar region: Secondary | ICD-10-CM | POA: Diagnosis not present

## 2014-12-10 ENCOUNTER — Ambulatory Visit: Payer: Medicare Other | Admitting: Family Medicine

## 2014-12-16 DIAGNOSIS — M5416 Radiculopathy, lumbar region: Secondary | ICD-10-CM | POA: Diagnosis not present

## 2014-12-16 DIAGNOSIS — M4806 Spinal stenosis, lumbar region: Secondary | ICD-10-CM | POA: Diagnosis not present

## 2014-12-23 ENCOUNTER — Ambulatory Visit (INDEPENDENT_AMBULATORY_CARE_PROVIDER_SITE_OTHER): Payer: Medicare Other | Admitting: Family Medicine

## 2014-12-23 ENCOUNTER — Encounter: Payer: Self-pay | Admitting: Family Medicine

## 2014-12-23 VITALS — BP 135/74 | HR 62 | Temp 98.0°F | Ht 72.0 in | Wt 205.0 lb

## 2014-12-23 DIAGNOSIS — I480 Paroxysmal atrial fibrillation: Secondary | ICD-10-CM | POA: Diagnosis not present

## 2014-12-23 DIAGNOSIS — M5136 Other intervertebral disc degeneration, lumbar region: Secondary | ICD-10-CM

## 2014-12-23 LAB — COAGUCHEK XS/INR WAIVED
INR: 1.7 — ABNORMAL HIGH (ref 0.9–1.1)
Prothrombin Time: 20.5 s

## 2014-12-23 NOTE — Assessment & Plan Note (Signed)
Discuss atrial fibrillation switch from Eliquis to warfarin will take Eliquis 2 more days then stay on warfarin as noted in note Recheck INR 2 weeks

## 2014-12-23 NOTE — Progress Notes (Signed)
   BP 135/74 mmHg  Pulse 62  Temp(Src) 98 F (36.7 C)  Ht 6' (1.829 m)  Wt 205 lb (92.987 kg)  BMI 27.80 kg/m2  SpO2 96%   Subjective:    Patient ID: Sean Hacker., male    DOB: 09-Sep-1940, 74 y.o.   MRN: [id number]  HPI: Sean Imbert. is a 74 y.o. male  Chief Complaint  Patient presents with  . Atrial Fibrillation   Patient warfarin to get another back injection is been on Eliquis restarted warfarin last week has been on and 7 days INR is back to 1.8. Patient will take Eliquis for 2 more days then stop tingling warfarin Patient taking warfarin for a half alternating with 4 mg daily Having no bleeding side effects bruising irritation.  Back was not helped at all and is still bothersome a great deal from the shot. Has had no bleeding or leg symptoms from the shot or medications Stopped Eliquis 3 days prior to shot as recommended by neurosurgery.  Relevant past medical, surgical, family and social history reviewed and updated as indicated. Interim medical history since our last visit reviewed. Allergies and medications reviewed and updated.  Review of Systems  Constitutional: Negative.   Respiratory: Negative.   Cardiovascular: Negative.     Per HPI unless specifically indicated above     Objective:    BP 135/74 mmHg  Pulse 62  Temp(Src) 98 F (36.7 C)  Ht 6' (1.829 m)  Wt 205 lb (92.987 kg)  BMI 27.80 kg/m2  SpO2 96%  Wt Readings from Last 3 Encounters:  12/23/14 205 lb (92.987 kg)  12/01/14 206 lb (93.441 kg)  10/22/14 202 lb (91.627 kg)    Physical Exam  Constitutional: He is oriented to person, place, and time. He appears well-developed and well-nourished. No distress.  HENT:  Head: Normocephalic and atraumatic.  Right Ear: Hearing normal.  Left Ear: Hearing normal.  Nose: Nose normal.  Eyes: Conjunctivae and lids are normal. Right eye exhibits no discharge. Left eye exhibits no discharge. No scleral icterus.  Pulmonary/Chest: Effort normal and  breath sounds normal. No respiratory distress.  Musculoskeletal: Normal range of motion.  Neurological: He is alert and oriented to person, place, and time.  Skin: Skin is intact. No rash noted.  Psychiatric: He has a normal mood and affect. His speech is normal and behavior is normal. Judgment and thought content normal. Cognition and memory are normal.    Results for orders placed or performed in visit on 10/22/14  CoaguChek XS/INR Waived  Result Value Ref Range   INR 1.6 (H) 0.9 - 1.1   Prothrombin Time 19.2 sec      Assessment & Plan:   Problem List Items Addressed This Visit      Cardiovascular and Mediastinum   Atrial fibrillation ([place]) - Primary   Relevant Orders   CoaguChek XS/INR Waived   AF (paroxysmal atrial fibrillation) ([place])    Discuss atrial fibrillation switch from Eliquis to warfarin will take Eliquis 2 more days then stay on warfarin as noted in note Recheck INR 2 weeks        Musculoskeletal and Integument   Lumbar degenerative disc disease    Patient getting shots from neurosurgeon requiring occasional adjustments changes which are being completed this week.          Follow up plan: Return in about 2 weeks (around 01/06/2015) for PT INR.

## 2014-12-23 NOTE — Assessment & Plan Note (Signed)
Patient getting shots from neurosurgeon requiring occasional adjustments changes which are being completed this week.

## 2014-12-24 ENCOUNTER — Other Ambulatory Visit: Payer: Self-pay

## 2014-12-24 MED ORDER — TAMSULOSIN HCL 0.4 MG PO CAPS: ORAL_CAPSULE | ORAL | Status: DC

## 2014-12-24 NOTE — Telephone Encounter (Signed)
LAST VISIT: 12/23/2014 UPCOMING APPT: 01/05/2015  Request for tramsulosin 0.4 mg caps.

## 2014-12-29 ENCOUNTER — Ambulatory Visit: Payer: Medicare Other | Admitting: Family Medicine

## 2015-01-05 ENCOUNTER — Ambulatory Visit (INDEPENDENT_AMBULATORY_CARE_PROVIDER_SITE_OTHER): Payer: Medicare Other | Admitting: Family Medicine

## 2015-01-05 ENCOUNTER — Encounter: Payer: Self-pay | Admitting: Family Medicine

## 2015-01-05 VITALS — BP 136/81 | HR 56 | Temp 97.9°F | Ht 72.0 in | Wt 206.0 lb

## 2015-01-05 DIAGNOSIS — I10 Essential (primary) hypertension: Secondary | ICD-10-CM

## 2015-01-05 DIAGNOSIS — I480 Paroxysmal atrial fibrillation: Secondary | ICD-10-CM | POA: Diagnosis not present

## 2015-01-05 DIAGNOSIS — I129 Hypertensive chronic kidney disease with stage 1 through stage 4 chronic kidney disease, or unspecified chronic kidney disease: Secondary | ICD-10-CM | POA: Insufficient documentation

## 2015-01-05 DIAGNOSIS — E119 Type 2 diabetes mellitus without complications: Secondary | ICD-10-CM

## 2015-01-05 DIAGNOSIS — E785 Hyperlipidemia, unspecified: Secondary | ICD-10-CM | POA: Insufficient documentation

## 2015-01-05 DIAGNOSIS — N183 Chronic kidney disease, stage 3 (moderate): Secondary | ICD-10-CM

## 2015-01-05 DIAGNOSIS — E1122 Type 2 diabetes mellitus with diabetic chronic kidney disease: Secondary | ICD-10-CM | POA: Insufficient documentation

## 2015-01-05 DIAGNOSIS — E1169 Type 2 diabetes mellitus with other specified complication: Secondary | ICD-10-CM | POA: Insufficient documentation

## 2015-01-05 LAB — COAGUCHEK XS/INR WAIVED
INR: 2.3 — ABNORMAL HIGH (ref 0.9–1.1)
Prothrombin Time: 27.3 s

## 2015-01-05 LAB — BAYER DCA HB A1C WAIVED: HB A1C (BAYER DCA - WAIVED): 6.6 % (ref <7.0)

## 2015-01-05 MED ORDER — METOPROLOL SUCCINATE ER 25 MG PO TB24: 25.0000 mg | ORAL_TABLET | Freq: Two times a day (BID) | ORAL | Status: DC

## 2015-01-05 MED ORDER — BENAZEPRIL HCL 40 MG PO TABS: 40.0000 mg | ORAL_TABLET | Freq: Every day | ORAL | Status: DC

## 2015-01-05 NOTE — Assessment & Plan Note (Signed)
The current medical regimen is effective;  continue present plan and medications.  

## 2015-01-05 NOTE — Assessment & Plan Note (Signed)
Patient's lab to be back on warfarin INR is doing well at 2.3 will continue same dose recheck INR 1 month

## 2015-01-05 NOTE — Progress Notes (Signed)
BP 136/81 mmHg  Pulse 56  Temp(Src) 97.9 F (36.6 C)  Ht 6' (1.829 m)  Wt 206 lb (93.441 kg)  BMI 27.93 kg/m2  SpO2 99%   Subjective:    Patient ID: Sean Hacker., male    DOB: 29-Dec-1940, 74 y.o.   MRN: [id number]  HPI: Sean Frappier. is a 74 y.o. male  Chief Complaint  Patient presents with  . Atrial Fibrillation  . Diabetes  y patient doing well no complaints except for some mild fatigue taking medications faithfully without side effects No complaints of bleeding or excessive bruising No low blood sugar spells Urine flow is doing okay   Relevant past medical, surgical, family and social history reviewed and updated as indicated. Interim medical history since our last visit reviewed. Allergies and medications reviewed and updated.  Review of Systems  Constitutional: Negative.   Respiratory: Negative.   Cardiovascular: Negative.     Per HPI unless specifically indicated above     Objective:    BP 136/81 mmHg  Pulse 56  Temp(Src) 97.9 F (36.6 C)  Ht 6' (1.829 m)  Wt 206 lb (93.441 kg)  BMI 27.93 kg/m2  SpO2 99%  Wt Readings from Last 3 Encounters:  01/05/15 206 lb (93.441 kg)  12/23/14 205 lb (92.987 kg)  12/01/14 206 lb (93.441 kg)    Physical Exam  Constitutional: He is oriented to person, place, and time. He appears well-developed and well-nourished. No distress.  HENT:  Head: Normocephalic and atraumatic.  Right Ear: Hearing normal.  Left Ear: Hearing normal.  Nose: Nose normal.  Eyes: Conjunctivae and lids are normal. Right eye exhibits no discharge. Left eye exhibits no discharge. No scleral icterus.  Cardiovascular: Normal rate, regular rhythm and normal heart sounds.   Pulmonary/Chest: Effort normal and breath sounds normal. No respiratory distress.  Musculoskeletal: Normal range of motion.  Neurological: He is alert and oriented to person, place, and time.  Skin: Skin is intact. No rash noted.  Psychiatric: He has a normal mood and  affect. His speech is normal and behavior is normal. Judgment and thought content normal. Cognition and memory are normal.    Results for orders placed or performed in visit on 12/23/14  CoaguChek XS/INR Waived  Result Value Ref Range   INR 1.7 (H) 0.9 - 1.1   Prothrombin Time 20.5 sec      Assessment & Plan:   Problem List Items Addressed This Visit      Cardiovascular and Mediastinum   Hypertension    The current medical regimen is effective;  continue present plan and medications.       Relevant Medications   warfarin (COUMADIN) 1 MG tablet   warfarin (COUMADIN) 4 MG tablet   metoprolol succinate (TOPROL-XL) 25 MG 24 hr tablet   benazepril (LOTENSIN) 40 MG tablet   Atrial fibrillation (HCC) - Primary    Patient's lab to be back on warfarin INR is doing well at 2.3 will continue same dose recheck INR 1 month      Relevant Medications   warfarin (COUMADIN) 1 MG tablet   warfarin (COUMADIN) 4 MG tablet   metoprolol succinate (TOPROL-XL) 25 MG 24 hr tablet   benazepril (LOTENSIN) 40 MG tablet   Other Relevant Orders   CoaguChek XS/INR Waived     Endocrine   Diabetes mellitus without complication ([place])    The current medical regimen is effective;  continue present plan and medications.       Relevant Medications  benazepril (LOTENSIN) 40 MG tablet   Other Relevant Orders   Bayer DCA Hb A1c Waived       Follow up plan: Return in about 3 months (around 04/07/2015) for 1 mo INR 3 mo a1c.

## 2015-01-11 DIAGNOSIS — G4733 Obstructive sleep apnea (adult) (pediatric): Secondary | ICD-10-CM | POA: Diagnosis not present

## 2015-01-11 DIAGNOSIS — Z9889 Other specified postprocedural states: Secondary | ICD-10-CM | POA: Diagnosis not present

## 2015-01-11 DIAGNOSIS — I10 Essential (primary) hypertension: Secondary | ICD-10-CM | POA: Diagnosis not present

## 2015-01-11 DIAGNOSIS — I481 Persistent atrial fibrillation: Secondary | ICD-10-CM | POA: Diagnosis not present

## 2015-01-11 DIAGNOSIS — I480 Paroxysmal atrial fibrillation: Secondary | ICD-10-CM | POA: Diagnosis not present

## 2015-02-04 ENCOUNTER — Ambulatory Visit: Payer: Medicare Other | Admitting: Family Medicine

## 2015-02-11 ENCOUNTER — Ambulatory Visit (INDEPENDENT_AMBULATORY_CARE_PROVIDER_SITE_OTHER): Payer: Medicare Other | Admitting: Family Medicine

## 2015-02-11 ENCOUNTER — Encounter: Payer: Self-pay | Admitting: Family Medicine

## 2015-02-11 VITALS — BP 126/76 | HR 57 | Temp 98.3°F | Wt 206.0 lb

## 2015-02-11 DIAGNOSIS — R040 Epistaxis: Secondary | ICD-10-CM

## 2015-02-11 DIAGNOSIS — Z7901 Long term (current) use of anticoagulants: Secondary | ICD-10-CM | POA: Diagnosis not present

## 2015-02-11 DIAGNOSIS — I480 Paroxysmal atrial fibrillation: Secondary | ICD-10-CM

## 2015-02-11 LAB — COAGUCHEK XS/INR WAIVED
INR: 2.4 — ABNORMAL HIGH (ref 0.9–1.1)
Prothrombin Time: 28.9 s

## 2015-02-11 NOTE — Assessment & Plan Note (Addendum)
Check INR today; INR was 2.4, within target; continue same, next INR one month

## 2015-02-11 NOTE — Patient Instructions (Signed)
Return in one month Stay the course., same dose and regimen with your warfarin

## 2015-02-11 NOTE — Progress Notes (Signed)
  BP 126/76 mmHg  Pulse 57  Temp(Src) 98.3 F (36.8 C)  Wt 206 lb (93.441 kg)  SpO2 98%   Subjective:    Patient ID: Sean Hacker., male    DOB: [birth year], 74 y.o.   MRN: [id number]  HPI: Sean Kirchberg. is a 74 y.o. male  Chief Complaint  Patient presents with  . Anticoagulation    He is currently taking 4mg , 4.5mg  alternating. He's had a few nose bleeds in the last month   He is taking coumadin for atrial fibrillation; he has been "fine" for a while, normal sinus rhythm He saw Dr. Nehemiah Massed (cardiologist) two or three months ago and everything was fine; they did EKG then He can completely tell when he is in atrial fibrillation He has had two ablations; was at [facility] and died on the table One time had a little nosebleed, then again on the right side No gum bleeding, no rectal bleeding, no hematuria  Relevant past medical, surgical, family and social history reviewed and updated as indicated. Interim medical history since our last visit reviewed. Allergies and medications reviewed and updated.  Review of Systems Per HPI unless specifically indicated above     Objective:    BP 126/76 mmHg  Pulse 57  Temp(Src) 98.3 F (36.8 C)  Wt 206 lb (93.441 kg)  SpO2 98%  Wt Readings from Last 3 Encounters:  02/11/15 206 lb (93.441 kg)  01/05/15 206 lb (93.441 kg)  12/23/14 205 lb (92.987 kg)    Physical Exam  Constitutional: He appears well-developed and well-nourished. No distress.  HENT:  Nose: No epistaxis.  Mouth/Throat: Mucous membranes are normal.  Cardiovascular: Normal rate and regular rhythm.   Pulmonary/Chest: Effort normal and breath sounds normal.  Skin: No bruising, no ecchymosis and no petechiae noted. No pallor.  Psychiatric: He has a normal mood and affect. Cognition and memory are normal.    Results for orders placed or performed in visit on 02/11/15  CoaguChek XS/INR Waived (STAT)  Result Value Ref Range   INR 2.4 (H) 0.9 - 1.1   Prothrombin Time 28.9  sec      Assessment & Plan:   Problem List Items Addressed This Visit      Cardiovascular and Mediastinum   AF (paroxysmal atrial fibrillation) (HCC) - Primary    Check INR today; no bleeding; INR was 2.4; continue same warfarin regimen and return for next INR in one month      Relevant Orders   CoaguChek XS/INR Waived (STAT) (Completed)     Other   Chronic anticoagulation    Check INR today; INR was 2.4, within target; continue same, next INR one month      Relevant Orders   CoaguChek XS/INR Waived (STAT) (Completed)    Other Visit Diagnoses    Right-sided nosebleed        likely drier air, on warfarin; nothing on exam was worrisome; not supra-therapeutic; monitor       Follow up plan: Return in about 1 month (around 03/14/2015).  Orders Placed This Encounter  Procedures  . CoaguChek XS/INR Waived (STAT)

## 2015-02-11 NOTE — Assessment & Plan Note (Addendum)
Check INR today; no bleeding; INR was 2.4; continue same warfarin regimen and return for next INR in one month

## 2015-03-12 ENCOUNTER — Ambulatory Visit: Payer: Medicare Other | Admitting: Family Medicine

## 2015-03-19 ENCOUNTER — Ambulatory Visit: Payer: Medicare Other | Admitting: Family Medicine

## 2015-03-23 ENCOUNTER — Ambulatory Visit (INDEPENDENT_AMBULATORY_CARE_PROVIDER_SITE_OTHER): Payer: Medicare Other | Admitting: Family Medicine

## 2015-03-23 ENCOUNTER — Encounter: Payer: Self-pay | Admitting: Family Medicine

## 2015-03-23 VITALS — BP 128/75 | HR 59 | Temp 97.7°F | Wt 208.0 lb

## 2015-03-23 DIAGNOSIS — R040 Epistaxis: Secondary | ICD-10-CM | POA: Insufficient documentation

## 2015-03-23 DIAGNOSIS — Z7901 Long term (current) use of anticoagulants: Secondary | ICD-10-CM | POA: Diagnosis not present

## 2015-03-23 DIAGNOSIS — I480 Paroxysmal atrial fibrillation: Secondary | ICD-10-CM

## 2015-03-23 DIAGNOSIS — G4733 Obstructive sleep apnea (adult) (pediatric): Secondary | ICD-10-CM | POA: Insufficient documentation

## 2015-03-23 DIAGNOSIS — Z9989 Dependence on other enabling machines and devices: Secondary | ICD-10-CM

## 2015-03-23 LAB — COAGUCHEK XS/INR WAIVED
INR: 2.6 — ABNORMAL HIGH (ref 0.9–1.1)
Prothrombin Time: 31.6 s

## 2015-03-23 NOTE — Patient Instructions (Signed)
Continue your current coumadin / warfarin as you are doing Return in 1 month for next INR check Try humidifying your home Try AYR nasal moisturizer If bleeding persists, then call us and we'll have you see the [facility] doctor

## 2015-03-23 NOTE — Assessment & Plan Note (Signed)
Continue current regimen of coumadin / warfarin; goal 2 to 3; next INR in one month

## 2015-03-23 NOTE — Assessment & Plan Note (Signed)
Using humidifier

## 2015-03-23 NOTE — Progress Notes (Signed)
BP 128/75 mmHg  Pulse 59  Temp(Src) 97.7 F (36.5 C)  Wt 208 lb (94.348 kg)  SpO2 100%   Subjective:    Patient ID: Sean Hacker., male    DOB: 02-Apr-1940, 75 y.o.   MRN: [id number]  HPI: Sean Petch. is a 75 y.o. male  Chief Complaint  Patient presents with  . Atrial Fibrillation    alternating 4mg , 4.5mg    He is here for coumadin recheck, INR is 2.6; no recent antibiotics; no bleeding from gums, in urine, or in stool; does have a touch of bleeding from right side nostril; air in the house is probably dry; has a humidifier in the house, but not running; never had to have a cautery; alternating 4 and 4.5 daily; uses CPAP for sleep apnea, has humidifier on that  Relevant past medical hx reviewed  Interim medical history since our last visit reviewed; he has had several runs of atrial fibrillation; two Sundays ago, he had a bad run, whooped his butt all day Sunday; then another run on Tuesday; another run at church this past Sunday; just like flicking a switch; he checks his BP and controlled today; he has not called his cardiologist about these episodes (I encouraged him to do so)   Allergies and medications reviewed and updated.  Review of Systems Per HPI unless specifically indicated above     Objective:    BP 128/75 mmHg  Pulse 59  Temp(Src) 97.7 F (36.5 C)  Wt 208 lb (94.348 kg)  SpO2 100%  Wt Readings from Last 3 Encounters:  03/23/15 208 lb (94.348 kg)  02/11/15 206 lb (93.441 kg)  01/05/15 206 lb (93.441 kg)    Physical Exam  Constitutional: He appears well-developed and well-nourished. No distress.  HENT:  Nose: No rhinorrhea, nose lacerations, nasal deformity or nasal septal hematoma. No epistaxis.  Mouth/Throat: Mucous membranes are not dry.  Cardiovascular: Regular rhythm.  Bradycardia present.   Borderline bradycardia, just under 60; rhythm is completely regular  Pulmonary/Chest: Effort normal and breath sounds normal.  Musculoskeletal: He  exhibits no edema.  Neurological: He is alert.  Skin: Skin is warm and dry. No pallor.  Psychiatric: He has a normal mood and affect.    Results for orders placed or performed in visit on 02/11/15  CoaguChek XS/INR Waived (STAT)  Result Value Ref Range   INR 2.4 (H) 0.9 - 1.1   Prothrombin Time 28.9 sec      Assessment & Plan:   Problem List Items Addressed This Visit      Cardiovascular and Mediastinum   AF (paroxysmal atrial fibrillation) ([place]) - Primary    INR today was 2.6, goal 2 to 3; continue same dose; encouraged him to call his cardiologist about his frequent runs of a-fib over the last 2 weeks      Relevant Orders   CoaguChek XS/INR Waived (STAT)     Respiratory   OSA on CPAP    Using humidifier        Other   Chronic anticoagulation    Continue current regimen of coumadin / warfarin; goal 2 to 3; next INR in one month      Right-sided nosebleed    Try AYR and humidifier; if persistent, can refer to ENT for eval and cautery         Follow up plan: Return in about 1 month (around 04/20/2015) for INR check with Dr. Jeananne Rama.  An after-visit summary was printed and given to the  patient at [place].  Please see the patient instructions which may contain other information and recommendations beyond what is mentioned above in the assessment and plan.  Orders Placed This Encounter  Procedures  . CoaguChek XS/INR Waived (STAT)

## 2015-03-23 NOTE — Assessment & Plan Note (Addendum)
INR today was 2.6, goal 2 to 3; continue same dose; encouraged him to call his cardiologist about his frequent runs of a-fib over the last 2 weeks

## 2015-03-23 NOTE — Assessment & Plan Note (Signed)
Try AYR and humidifier; if persistent, can refer to ENT for eval and cautery

## 2015-04-07 ENCOUNTER — Ambulatory Visit (INDEPENDENT_AMBULATORY_CARE_PROVIDER_SITE_OTHER): Payer: Medicare Other | Admitting: Family Medicine

## 2015-04-07 ENCOUNTER — Encounter: Payer: Self-pay | Admitting: Family Medicine

## 2015-04-07 VITALS — BP 156/91 | HR 55 | Temp 97.7°F | Ht 71.8 in | Wt 204.0 lb

## 2015-04-07 DIAGNOSIS — I10 Essential (primary) hypertension: Secondary | ICD-10-CM | POA: Diagnosis not present

## 2015-04-07 DIAGNOSIS — E119 Type 2 diabetes mellitus without complications: Secondary | ICD-10-CM

## 2015-04-07 DIAGNOSIS — I4891 Unspecified atrial fibrillation: Secondary | ICD-10-CM | POA: Diagnosis not present

## 2015-04-07 LAB — BAYER DCA HB A1C WAIVED: HB A1C (BAYER DCA - WAIVED): 7 % — ABNORMAL HIGH (ref <7.0)

## 2015-04-07 NOTE — Assessment & Plan Note (Signed)
Discussed blood pressure poor control will re-add hydrochlorothiazide 12.5 mg 1 a day Recheck blood pressure at office visits for INR will also check BMP in 1 month

## 2015-04-07 NOTE — Progress Notes (Signed)
   BP 156/91 mmHg  Pulse 55  Temp(Src) 97.7 F (36.5 C)  Ht 5' 11.8" (1.824 m)  Wt 204 lb (92.534 kg)  BMI 27.81 kg/m2  SpO2 99%   Subjective:    Patient ID: Sean Reyes., male    DOB: August 01, 1940, 75 y.o.   MRN: [id number]  HPI: Sean Reyes. is a 75 y.o. male  Chief Complaint  Patient presents with  . Diabetes   Follow-up diabetes doing well no complaints from medications noted low blood sugar spells takes medicines faithfully Warfarin no issues no bleeding no concerns Blood pressure has had elevated blood pressure readings took a few days of hydrochlorothiazide 12.5 mg which leveled out patient felt better and stopped  Relevant past medical, surgical, family and social history reviewed and updated as indicated. Interim medical history since our last visit reviewed. Allergies and medications reviewed and updated.  Review of Systems  Constitutional: Negative.   Respiratory: Negative.   Cardiovascular: Positive for palpitations.    Per HPI unless specifically indicated above     Objective:    BP 156/91 mmHg  Pulse 55  Temp(Src) 97.7 F (36.5 C)  Ht 5' 11.8" (1.824 m)  Wt 204 lb (92.534 kg)  BMI 27.81 kg/m2  SpO2 99%  Wt Readings from Last 3 Encounters:  04/07/15 204 lb (92.534 kg)  03/23/15 208 lb (94.348 kg)  02/11/15 206 lb (93.441 kg)    Physical Exam  Constitutional: He is oriented to person, place, and time. He appears well-developed and well-nourished. No distress.  HENT:  Head: Normocephalic and atraumatic.  Right Ear: Hearing normal.  Left Ear: Hearing normal.  Nose: Nose normal.  Eyes: Conjunctivae and lids are normal. Right eye exhibits no discharge. Left eye exhibits no discharge. No scleral icterus.  Cardiovascular: Normal rate.   Pulmonary/Chest: Effort normal and breath sounds normal. No respiratory distress.  Musculoskeletal: Normal range of motion.  Neurological: He is alert and oriented to person, place, and time.  Skin: Skin is  intact. No rash noted.  Psychiatric: He has a normal mood and affect. His speech is normal and behavior is normal. Judgment and thought content normal. Cognition and memory are normal.    Results for orders placed or performed in visit on 04/07/15  Bayer DCA Hb A1c Waived  Result Value Ref Range   Bayer DCA Hb A1c Waived 7.0 (H) <7.0 %      Assessment & Plan:   Problem List Items Addressed This Visit      Cardiovascular and Mediastinum   Hypertension    Discussed blood pressure poor control will re-add hydrochlorothiazide 12.5 mg 1 a day Recheck blood pressure at office visits for INR will also check BMP in 1 month      Atrial fibrillation (HCC)    The current medical regimen is effective;  continue present plan and medications.         Endocrine   Diabetes mellitus without complication ([place]) - Primary    The current medical regimen is effective;  continue present plan and medications.       Relevant Orders   Bayer DCA Hb A1c Waived (Completed)       Follow up plan: Return for INR coming up March 7 will also check BMP.

## 2015-04-07 NOTE — Assessment & Plan Note (Signed)
The current medical regimen is effective;  continue present plan and medications.  

## 2015-04-16 DIAGNOSIS — I10 Essential (primary) hypertension: Secondary | ICD-10-CM | POA: Diagnosis not present

## 2015-04-16 DIAGNOSIS — I482 Chronic atrial fibrillation: Secondary | ICD-10-CM | POA: Diagnosis not present

## 2015-04-16 DIAGNOSIS — I480 Paroxysmal atrial fibrillation: Secondary | ICD-10-CM | POA: Diagnosis not present

## 2015-04-19 DIAGNOSIS — I4891 Unspecified atrial fibrillation: Secondary | ICD-10-CM | POA: Diagnosis not present

## 2015-04-19 DIAGNOSIS — Z9889 Other specified postprocedural states: Secondary | ICD-10-CM | POA: Diagnosis not present

## 2015-04-19 DIAGNOSIS — I10 Essential (primary) hypertension: Secondary | ICD-10-CM | POA: Diagnosis not present

## 2015-04-19 DIAGNOSIS — I480 Paroxysmal atrial fibrillation: Secondary | ICD-10-CM | POA: Diagnosis not present

## 2015-04-19 DIAGNOSIS — G4733 Obstructive sleep apnea (adult) (pediatric): Secondary | ICD-10-CM | POA: Diagnosis not present

## 2015-04-20 ENCOUNTER — Encounter: Payer: Self-pay | Admitting: Family Medicine

## 2015-04-20 ENCOUNTER — Ambulatory Visit (INDEPENDENT_AMBULATORY_CARE_PROVIDER_SITE_OTHER): Payer: Medicare Other | Admitting: Family Medicine

## 2015-04-20 VITALS — BP 110/71 | HR 89 | Temp 97.6°F | Ht 71.0 in | Wt 206.0 lb

## 2015-04-20 DIAGNOSIS — I480 Paroxysmal atrial fibrillation: Secondary | ICD-10-CM

## 2015-04-20 DIAGNOSIS — I10 Essential (primary) hypertension: Secondary | ICD-10-CM | POA: Diagnosis not present

## 2015-04-20 LAB — COAGUCHEK XS/INR WAIVED
INR: 2.8 — ABNORMAL HIGH (ref 0.9–1.1)
Prothrombin Time: 33.8 s

## 2015-04-20 MED ORDER — HYDROCHLOROTHIAZIDE 12.5 MG PO TABS: 12.5000 mg | ORAL_TABLET | Freq: Every day | ORAL | Status: DC

## 2015-04-20 MED ORDER — WARFARIN SODIUM 4 MG PO TABS: 4.0000 mg | ORAL_TABLET | Freq: Once | ORAL | Status: DC

## 2015-04-20 MED ORDER — WARFARIN SODIUM 1 MG PO TABS: 0.5000 mg | ORAL_TABLET | ORAL | Status: DC

## 2015-04-20 MED ORDER — METFORMIN HCL ER 500 MG PO TB24: ORAL_TABLET | ORAL | Status: DC

## 2015-04-20 NOTE — Assessment & Plan Note (Signed)
The current medical regimen is effective;  continue present plan and medications.  

## 2015-04-20 NOTE — Assessment & Plan Note (Signed)
Followed at cardiology with pending cardioversion

## 2015-04-20 NOTE — Progress Notes (Signed)
BP 110/71 mmHg  Pulse 89  Temp(Src) 97.6 F (36.4 C)  Ht 5\' 11"  (1.803 m)  Wt 206 lb (93.441 kg)  BMI 28.74 kg/m2  SpO2 95%   Subjective:    Patient ID: Sean Hacker., male    DOB: 08-27-1940, 75 y.o.   MRN: [id number]  HPI: Sean Santer. is a 75 y.o. male  Chief Complaint  Patient presents with  . Atrial Fibrillation   Patient follow-up A. fib is been in a spell for over a week now is getting ready for cardioversion next week INR is good at 2.8 taken warfarin for milligrams 1 day and 4.5 mg the next day this is maintained him between 2.5 and 3.5. No issues with bleeding or bruising. Blood pressures been elevated added back hydrochlorothiazide 25 mg 1 a day and now blood pressure doing good control Reviewed medical records and hydrochlorothiazide 12.5 was used. BMP is being checked today Other medications no complaints or concerns No blood sugar issues Relevant past medical, surgical, family and social history reviewed and updated as indicated. Interim medical history since our last visit reviewed. Allergies and medications reviewed and updated.  Review of Systems  Constitutional: Positive for fatigue.       Patient with marked fatigue that he usually gets when in atrial fibrillation  Respiratory: Negative.   Cardiovascular: Positive for palpitations. Negative for chest pain and leg swelling.    Per HPI unless specifically indicated above     Objective:    BP 110/71 mmHg  Pulse 89  Temp(Src) 97.6 F (36.4 C)  Ht 5\' 11"  (1.803 m)  Wt 206 lb (93.441 kg)  BMI 28.74 kg/m2  SpO2 95%  Wt Readings from Last 3 Encounters:  04/20/15 206 lb (93.441 kg)  04/07/15 204 lb (92.534 kg)  03/23/15 208 lb (94.348 kg)    Physical Exam  Constitutional: He is oriented to person, place, and time. He appears well-developed and well-nourished. No distress.  HENT:  Head: Normocephalic and atraumatic.  Right Ear: Hearing normal.  Left Ear: Hearing normal.  Nose: Nose  normal.  Eyes: Conjunctivae and lids are normal. Right eye exhibits no discharge. Left eye exhibits no discharge. No scleral icterus.  Pulmonary/Chest: Effort normal and breath sounds normal. No respiratory distress.  Musculoskeletal: Normal range of motion.  Neurological: He is alert and oriented to person, place, and time.  Skin: Skin is intact. No rash noted.  Psychiatric: He has a normal mood and affect. His speech is normal and behavior is normal. Judgment and thought content normal. Cognition and memory are normal.    Results for orders placed or performed in visit on 04/07/15  Bayer DCA Hb A1c Waived  Result Value Ref Range   Bayer DCA Hb A1c Waived 7.0 (H) <7.0 %      Assessment & Plan:   Problem List Items Addressed This Visit      Cardiovascular and Mediastinum   Hypertension    The current medical regimen is effective;  continue present plan and medications.       Relevant Medications   warfarin (COUMADIN) 4 MG tablet   warfarin (COUMADIN) 1 MG tablet   hydrochlorothiazide (HYDRODIURIL) 12.5 MG tablet   AF (paroxysmal atrial fibrillation) (HCC) - Primary    Followed at cardiology with pending cardioversion      Relevant Medications   warfarin (COUMADIN) 4 MG tablet   warfarin (COUMADIN) 1 MG tablet   hydrochlorothiazide (HYDRODIURIL) 12.5 MG tablet   Other Relevant Orders  Basic metabolic panel   CoaguChek XS/INR Waived       Follow up plan: Return in about 4 weeks (around 05/18/2015) for PT INR.

## 2015-04-21 ENCOUNTER — Encounter: Payer: Self-pay | Admitting: Family Medicine

## 2015-04-21 LAB — BASIC METABOLIC PANEL
BUN/Creatinine Ratio: 13 (ref 10–22)
BUN: 14 mg/dL (ref 8–27)
CO2: 26 mmol/L (ref 18–29)
Calcium: 9 mg/dL (ref 8.6–10.2)
Chloride: 97 mmol/L (ref 96–106)
Creatinine, Ser: 1.09 mg/dL (ref 0.76–1.27)
GFR calc Af Amer: 78 mL/min/{1.73_m2} (ref >59)
GFR calc non Af Amer: 67 mL/min/{1.73_m2} (ref >59)
Glucose: 167 mg/dL — ABNORMAL HIGH (ref 65–99)
Potassium: 3.9 mmol/L (ref 3.5–5.2)
Sodium: 137 mmol/L (ref 134–144)

## 2015-04-27 DIAGNOSIS — I4891 Unspecified atrial fibrillation: Secondary | ICD-10-CM | POA: Diagnosis not present

## 2015-04-28 ENCOUNTER — Ambulatory Visit: Admission: RE | Admit: 2015-04-28 | Payer: Medicare Other | Source: Ambulatory Visit | Admitting: Internal Medicine

## 2015-04-28 ENCOUNTER — Encounter: Admission: RE | Payer: Self-pay | Source: Ambulatory Visit

## 2015-04-28 SURGERY — CARDIOVERSION (CATH LAB)
Anesthesia: General

## 2015-05-25 ENCOUNTER — Ambulatory Visit: Payer: Medicare Other | Admitting: Family Medicine

## 2015-05-31 ENCOUNTER — Ambulatory Visit (INDEPENDENT_AMBULATORY_CARE_PROVIDER_SITE_OTHER): Payer: Medicare Other | Admitting: Family Medicine

## 2015-05-31 ENCOUNTER — Encounter: Payer: Self-pay | Admitting: Family Medicine

## 2015-05-31 VITALS — BP 145/80 | HR 60 | Temp 97.9°F | Ht 71.0 in | Wt 211.0 lb

## 2015-05-31 DIAGNOSIS — I4891 Unspecified atrial fibrillation: Secondary | ICD-10-CM

## 2015-05-31 LAB — COAGUCHEK XS/INR WAIVED
INR: 1.8 — ABNORMAL HIGH (ref 0.9–1.1)
Prothrombin Time: 21.6 s

## 2015-05-31 NOTE — Assessment & Plan Note (Signed)
The current medical regimen is effective;  continue present plan and medications.  

## 2015-05-31 NOTE — Progress Notes (Signed)
   BP 145/80 mmHg  Pulse 60  Temp(Src) 97.9 F (36.6 C)  Ht 5\' 11"  (1.803 m)  Wt 211 lb (95.709 kg)  BMI 29.44 kg/m2  SpO2 95%   Subjective:    Patient ID: Sean Hacker., male    DOB: 1940/12/01, 75 y.o.   MRN: [id number]  HPI: Sean Holtzclaw. is a 75 y.o. male  Chief Complaint  Patient presents with  . Atrial Fibrillation  Follow-up A. fib and warfarin was at the beach this weekend and missed a tablet otherwise been doing well no bruising bleeding issues And surprisingly no A. fib over the last month or 2 Relevant past medical, surgical, family and social history reviewed and updated as indicated. Interim medical history since our last visit reviewed. Allergies and medications reviewed and updated.  Review of Systems  Per HPI unless specifically indicated above     Objective:    BP 145/80 mmHg  Pulse 60  Temp(Src) 97.9 F (36.6 C)  Ht 5\' 11"  (1.803 m)  Wt 211 lb (95.709 kg)  BMI 29.44 kg/m2  SpO2 95%  Wt Readings from Last 3 Encounters:  05/31/15 211 lb (95.709 kg)  04/20/15 206 lb (93.441 kg)  04/07/15 204 lb (92.534 kg)    Physical Exam  Constitutional: He is oriented to person, place, and time. He appears well-developed and well-nourished. No distress.  HENT:  Head: Normocephalic and atraumatic.  Right Ear: Hearing normal.  Left Ear: Hearing normal.  Nose: Nose normal.  Eyes: Conjunctivae and lids are normal. Right eye exhibits no discharge. Left eye exhibits no discharge. No scleral icterus.  Cardiovascular: Normal rate, regular rhythm and normal heart sounds.   Pulmonary/Chest: Effort normal and breath sounds normal. No respiratory distress.  Musculoskeletal: Normal range of motion.  Neurological: He is alert and oriented to person, place, and time.  Skin: Skin is intact. No rash noted.  Psychiatric: He has a normal mood and affect. His speech is normal and behavior is normal. Judgment and thought content normal. Cognition and memory are normal.     Results for orders placed or performed in visit on 04/26/15  HM DIABETES EYE EXAM  Result Value Ref Range   HM Diabetic Eye Exam No Retinopathy No Retinopathy      Assessment & Plan:   Problem List Items Addressed This Visit      Cardiovascular and Mediastinum   Atrial fibrillation ([place]) - Primary    The current medical regimen is effective;  continue present plan and medications.       Relevant Orders   CoaguChek XS/INR Waived       Follow up plan: Return in about 4 weeks (around 06/28/2015) for PT INR.

## 2015-06-01 ENCOUNTER — Other Ambulatory Visit (HOSPITAL_COMMUNITY): Payer: Self-pay | Admitting: Neurosurgery

## 2015-06-01 DIAGNOSIS — M4806 Spinal stenosis, lumbar region: Secondary | ICD-10-CM | POA: Diagnosis not present

## 2015-06-01 DIAGNOSIS — M5137 Other intervertebral disc degeneration, lumbosacral region: Secondary | ICD-10-CM | POA: Diagnosis not present

## 2015-06-01 DIAGNOSIS — M48061 Spinal stenosis, lumbar region without neurogenic claudication: Secondary | ICD-10-CM

## 2015-06-02 ENCOUNTER — Ambulatory Visit (INDEPENDENT_AMBULATORY_CARE_PROVIDER_SITE_OTHER): Payer: Medicare Other | Admitting: Family Medicine

## 2015-06-02 ENCOUNTER — Encounter: Payer: Self-pay | Admitting: Family Medicine

## 2015-06-02 VITALS — BP 138/71 | HR 56 | Temp 97.9°F | Ht 71.2 in | Wt 207.0 lb

## 2015-06-02 DIAGNOSIS — E119 Type 2 diabetes mellitus without complications: Secondary | ICD-10-CM | POA: Diagnosis not present

## 2015-06-02 DIAGNOSIS — Z0000 Encounter for general adult medical examination without abnormal findings: Secondary | ICD-10-CM

## 2015-06-02 DIAGNOSIS — S1091XA Abrasion of unspecified part of neck, initial encounter: Secondary | ICD-10-CM | POA: Diagnosis not present

## 2015-06-02 DIAGNOSIS — N400 Enlarged prostate without lower urinary tract symptoms: Secondary | ICD-10-CM | POA: Insufficient documentation

## 2015-06-02 DIAGNOSIS — I4891 Unspecified atrial fibrillation: Secondary | ICD-10-CM

## 2015-06-02 DIAGNOSIS — T148 Other injury of unspecified body region: Secondary | ICD-10-CM

## 2015-06-02 DIAGNOSIS — I10 Essential (primary) hypertension: Secondary | ICD-10-CM | POA: Diagnosis not present

## 2015-06-02 DIAGNOSIS — N138 Other obstructive and reflux uropathy: Secondary | ICD-10-CM | POA: Insufficient documentation

## 2015-06-02 DIAGNOSIS — N401 Enlarged prostate with lower urinary tract symptoms: Secondary | ICD-10-CM

## 2015-06-02 DIAGNOSIS — M5136 Other intervertebral disc degeneration, lumbar region: Secondary | ICD-10-CM

## 2015-06-02 DIAGNOSIS — IMO0002 Reserved for concepts with insufficient information to code with codable children: Secondary | ICD-10-CM

## 2015-06-02 DIAGNOSIS — Z23 Encounter for immunization: Secondary | ICD-10-CM

## 2015-06-02 LAB — URINALYSIS, ROUTINE W REFLEX MICROSCOPIC
Bilirubin, UA: NEGATIVE
Glucose, UA: NEGATIVE
Ketones, UA: NEGATIVE
Nitrite, UA: NEGATIVE
Protein, UA: NEGATIVE
Specific Gravity, UA: 1.025 (ref 1.005–1.030)
Urobilinogen, Ur: 0.2 mg/dL (ref 0.2–1.0)
pH, UA: 5 (ref 5.0–7.5)

## 2015-06-02 LAB — MICROSCOPIC EXAMINATION

## 2015-06-02 MED ORDER — METFORMIN HCL ER 500 MG PO TB24: ORAL_TABLET | ORAL | Status: DC

## 2015-06-02 MED ORDER — WARFARIN SODIUM 1 MG PO TABS: 0.5000 mg | ORAL_TABLET | ORAL | Status: DC

## 2015-06-02 MED ORDER — HYDROCHLOROTHIAZIDE 12.5 MG PO TABS: 12.5000 mg | ORAL_TABLET | Freq: Every day | ORAL | Status: DC

## 2015-06-02 MED ORDER — METOPROLOL SUCCINATE ER 25 MG PO TB24: 25.0000 mg | ORAL_TABLET | Freq: Two times a day (BID) | ORAL | Status: DC

## 2015-06-02 MED ORDER — BENAZEPRIL HCL 40 MG PO TABS: 40.0000 mg | ORAL_TABLET | Freq: Every day | ORAL | Status: DC

## 2015-06-02 MED ORDER — FINASTERIDE 5 MG PO TABS: 5.0000 mg | ORAL_TABLET | Freq: Every day | ORAL | Status: DC

## 2015-06-02 MED ORDER — WARFARIN SODIUM 4 MG PO TABS: 4.0000 mg | ORAL_TABLET | Freq: Once | ORAL | Status: DC

## 2015-06-02 MED ORDER — TAMSULOSIN HCL 0.4 MG PO CAPS: ORAL_CAPSULE | ORAL | Status: DC

## 2015-06-02 NOTE — Patient Instructions (Addendum)
Pneumococcal Polysaccharide Vaccine: What You Need to Know 1. Why get vaccinated? Vaccination can protect older adults (and some children and younger adults) from pneumococcal disease. Pneumococcal disease is caused by bacteria that can spread from person to person through close contact. It can cause ear infections, and it can also lead to more serious infections of the:   Lungs (pneumonia),  Blood (bacteremia), and  Covering of the brain and spinal cord (meningitis). Meningitis can cause deafness and brain damage, and it can be fatal. Anyone can get pneumococcal disease, but children under 62 years of age, people with certain medical conditions, adults over 68 years of age, and cigarette smokers are at the highest risk. About 18,000 older adults die each year from pneumococcal disease in the [place]. Treatment of pneumococcal infections with penicillin and other drugs used to be more effective. But some strains of the disease have become resistant to these drugs. This makes prevention of the disease, through vaccination, even more important. 2. Pneumococcal polysaccharide vaccine (PPSV23) Pneumococcal polysaccharide vaccine (PPSV23) protects against 23 types of pneumococcal bacteria. It will not prevent all pneumococcal disease. PPSV23 is recommended for:  All adults 6 years of age and older,  Anyone 2 through 73 years of age with certain long-term health problems,  Anyone 2 through 73 years of age with a weakened immune system,  Adults 64 through 73 years of age who smoke cigarettes or have asthma. Most people need only one dose of PPSV. A second dose is recommended for certain high-risk groups. People 53 and older should get a dose even if they have gotten one or more doses of the vaccine before they turned 65. Your healthcare provider can give you more information about these recommendations. Most healthy adults develop protection within 2 to 3 weeks of getting the shot. 3. Some  people should not get this vaccine  Anyone who has had a life-threatening allergic reaction to PPSV should not get another dose.  Anyone who has a severe allergy to any component of PPSV should not receive it. Tell your provider if you have any severe allergies.  Anyone who is moderately or severely ill when the shot is scheduled may be asked to wait until they recover before getting the vaccine. Someone with a mild illness can usually be vaccinated.  Children less than 83 years of age should not receive this vaccine.  There is no evidence that PPSV is harmful to either a pregnant woman or to her fetus. However, as a precaution, women who need the vaccine should be vaccinated before becoming pregnant, if possible. 4. Risks of a vaccine reaction With any medicine, including vaccines, there is a chance of side effects. These are usually mild and go away on their own, but serious reactions are also possible. About half of people who get PPSV have mild side effects, such as redness or pain where the shot is given, which go away within about two days. Less than 1 out of 100 people develop a fever, muscle aches, or more severe local reactions. Problems that could happen after any vaccine:  People sometimes faint after a medical procedure, including vaccination. Sitting or lying down for about 15 minutes can help prevent fainting, and injuries caused by a fall. Tell your doctor if you feel dizzy, or have vision changes or ringing in the ears.  Some people get severe pain in the shoulder and have difficulty moving the arm where a shot was given. This happens very rarely.  Any medication  can cause a severe allergic reaction. Such reactions from a vaccine are very rare, estimated at about 1 in a million doses, and would happen within a few minutes to a few hours after the vaccination. As with any medicine, there is a very remote chance of a vaccine causing a serious injury or death. The safety of  vaccines is always being monitored. For more information, visit: http://www.aguilar.org/ 5. What if there is a serious reaction? What should I look for? Look for anything that concerns you, such as signs of a severe allergic reaction, very high fever, or unusual behavior.  Signs of a severe allergic reaction can include hives, swelling of the face and throat, difficulty breathing, a fast heartbeat, dizziness, and weakness. These would usually start a few minutes to a few hours after the vaccination. What should I do? If you think it is a severe allergic reaction or other emergency that can't wait, call 9-1-1 or get to the nearest hospital. Otherwise, call your doctor. Afterward, the reaction should be reported to the Vaccine Adverse Event Reporting System (VAERS). Your doctor might file this report, or you can do it yourself through the VAERS web site at www.vaers.SamedayNews.es, or by calling 320-426-5155.  VAERS does not give medical advice. 6. How can I learn more?  Ask your doctor. He or she can give you the vaccine package insert or suggest other sources of information.  Call your local or state health department.  Contact the Centers for Disease Control and Prevention (CDC):  Call 518-755-4694 (1-800-CDC-INFO) or  Visit CDC's website at http://hunter.com/ CDC Pneumococcal Polysaccharide Vaccine VIS (06/06/13)   This information is not intended to replace advice given to you by your health care provider. Make sure you discuss any questions you have with your health care provider.   Document Released: 11/27/2005 Document Revised: 02/20/2014 Document Reviewed: 06/09/2013 Elsevier Interactive Patient Education 2016 [place] (Tetanus and Diphtheria): What You Need to Know 1. Why get vaccinated? Tetanus  and diphtheria are very serious diseases. They are rare in the [place] today, but people who do become infected often have severe complications. Td vaccine is used  to protect adolescents and adults from both of these diseases. Both tetanus and diphtheria are infections caused by bacteria. Diphtheria spreads from person to person through coughing or sneezing. Tetanus-causing bacteria enter the body through cuts, scratches, or wounds. TETANUS (Lockjaw) causes painful muscle tightening and stiffness, usually all over the body.  It can lead to tightening of muscles in the head and neck so you can't open your mouth, swallow, or sometimes even breathe. Tetanus kills about 1 out of every 10 people who are infected even after receiving the best medical care. DIPHTHERIA can cause a thick coating to form in the back of the throat.  It can lead to breathing problems, paralysis, heart failure, and death. Before vaccines, as many as 200,000 cases of diphtheria and hundreds of cases of tetanus were reported in the [place] each year. Since vaccination began, reports of cases for both diseases have dropped by about 99%. 2. Td vaccine Td vaccine can protect adolescents and adults from tetanus and diphtheria. Td is usually given as a booster dose every 10 years but it can also be given earlier after a severe and dirty wound or burn. Another vaccine, called Tdap, which protects against pertussis in addition to tetanus and diphtheria, is sometimes recommended instead of Td vaccine. Your doctor or the person giving you the vaccine can give  you more information. Td may safely be given at the same time as other vaccines. 3. Some people should not get this vaccine  A person who has ever had a life-threatening allergic reaction after a previous dose of any tetanus or diphtheria containing vaccine, OR has a severe allergy to any part of this vaccine, should not get Td vaccine. Tell the person giving the vaccine about any severe allergies.  Talk to your doctor if you:  have seizures or another nervous system problem,  had severe pain or swelling after any vaccine containing  diphtheria or tetanus,  ever had a condition called Guillain Barre Syndrome (GBS),  aren't feeling well on the day the shot is scheduled. 4. Risks of a vaccine reaction With any medicine, including vaccines, there is a chance of side effects. These are usually mild and go away on their own. Serious reactions are also possible but are rare. Most people who get Td vaccine do not have any problems with it. Mild Problems  following Td vaccine: (Did not interfere with activities)  Pain where the shot was given (about 8 people in 10)  Redness or swelling where the shot was given (about 1 person in 4)  Mild fever (rare)  Headache (about 1 person in 4)  Tiredness (about 1 person in 4) Moderate Problems following Td vaccine: (Interfered with activities, but did not require medical attention)  Fever over 102F (rare) Severe Problems  following Td vaccine: (Unable to perform usual activities; required medical attention)  Swelling, severe pain, bleeding and/or redness in the arm where the shot was given (rare). Problems that could happen after any vaccine:  People sometimes faint after a medical procedure, including vaccination. Sitting or lying down for about 15 minutes can help prevent fainting, and injuries caused by a fall. Tell your doctor if you feel dizzy, or have vision changes or ringing in the ears.  Some people get severe pain in the shoulder and have difficulty moving the arm where a shot was given. This happens very rarely.  Any medication can cause a severe allergic reaction. Such reactions from a vaccine are very rare, estimated at fewer than 1 in a million doses, and would happen within a few minutes to a few hours after the vaccination. As with any medicine, there is a very remote chance of a vaccine causing a serious injury or death. The safety of vaccines is always being monitored. For more information, visit: http://www.aguilar.org/ 5. What if there is a serious  reaction? What should I look for?  Look for anything that concerns you, such as signs of a severe allergic reaction, very high fever, or unusual behavior. Signs of a severe allergic reaction can include hives, swelling of the face and throat, difficulty breathing, a fast heartbeat, dizziness, and weakness. These would usually start a few minutes to a few hours after the vaccination. What should I do?  If you think it is a severe allergic reaction or other emergency that can't wait, call 9-1-1 or get the person to the nearest hospital. Otherwise, call your doctor.  Afterward, the reaction should be reported to the Vaccine Adverse Event Reporting System (VAERS). Your doctor might file this report, or you can do it yourself through the VAERS web site at www.vaers.SamedayNews.es, or by calling (279)497-4622. VAERS does not give medical advice. 6. The National Vaccine Injury Compensation Program The National Vaccine Injury Compensation Program (VICP) is a federal program that was created to compensate people who may have been injured  by certain vaccines. Persons who believe they may have been injured by a vaccine can learn about the program and about filing a claim by calling 6124223406 or visiting the [employer] website at GoldCloset.com.ee. There is a time limit to file a claim for compensation. 7. How can I learn more?  Ask your doctor. He or she can give you the vaccine package insert or suggest other sources of information.  Call your local or state health department.  Contact the Centers for Disease Control and Prevention (CDC):  Call 365-823-7171 (1-800-CDC-INFO)  Visit CDC's website at http://hunter.com/ CDC Td Vaccine VIS (04/08/13)   This information is not intended to replace advice given to you by your health care provider. Make sure you discuss any questions you have with your health care provider.   Document Released: 11/27/2005 Document Revised: 02/20/2014 Document  Reviewed: 05/14/2013 Elsevier Interactive Patient Education [employer].

## 2015-06-02 NOTE — Progress Notes (Addendum)
BP 138/71 mmHg  Pulse 56  Temp(Src) 97.9 F (36.6 C)  Ht 5' 11.2" (1.808 m)  Wt 207 lb (93.895 kg)  BMI 28.72 kg/m2  SpO2 99%   Subjective:    Patient ID: Sean Reyes., male    DOB: 22-Jun-1940, 75 y.o.   MRN: [id number]  HPI: Sean Reyes. is a 75 y.o. male  Chief Complaint  Patient presents with  . Annual Exam  Wound on his nose is been present for several days. Irritated inflamed will need tetanus shot Patient with noted low blood sugar spells diabetes been doing well no complaints Blood pressure doing well no complaints from medications takes faithfully Patient with some BPH symptoms with lower urinary tract problems slow stream and frequency. A. fib and care with warfarin stable see previous notes Relevant past medical, surgical, family and social history reviewed and updated as indicated. Interim medical history since our last visit reviewed. Allergies and medications reviewed and updated.  Review of Systems  Constitutional: Negative.   HENT: Negative.   Eyes: Negative.   Respiratory: Negative.   Cardiovascular: Negative.   Gastrointestinal: Negative.   Endocrine: Negative.   Genitourinary: Negative.   Musculoskeletal: Negative.   Skin: Negative.   Allergic/Immunologic: Negative.   Neurological: Negative.   Hematological: Negative.   Psychiatric/Behavioral: Negative.     Per HPI unless specifically indicated above     Objective:    BP 138/71 mmHg  Pulse 56  Temp(Src) 97.9 F (36.6 C)  Ht 5' 11.2" (1.808 m)  Wt 207 lb (93.895 kg)  BMI 28.72 kg/m2  SpO2 99%  Wt Readings from Last 3 Encounters:  06/02/15 207 lb (93.895 kg)  05/31/15 211 lb (95.709 kg)  04/20/15 206 lb (93.441 kg)    Physical Exam  Constitutional: He is oriented to person, place, and time. He appears well-developed and well-nourished.  HENT:  Head: Normocephalic and atraumatic.  Right Ear: External ear normal.  Left Ear: External ear normal.  Eyes: Conjunctivae and EOM are  normal. Pupils are equal, round, and reactive to light.  Neck: Normal range of motion. Neck supple.  Cardiovascular: Normal rate, regular rhythm, normal heart sounds and intact distal pulses.   Pulmonary/Chest: Effort normal and breath sounds normal.  Abdominal: Soft. Bowel sounds are normal. There is no splenomegaly or hepatomegaly.  Genitourinary: Rectum normal and penis normal.  Prostate enlarged  Musculoskeletal: Normal range of motion.  Neurological: He is alert and oriented to person, place, and time. He has normal reflexes.  Skin: No rash noted. No erythema.  Psychiatric: He has a normal mood and affect. His behavior is normal. Judgment and thought content normal.        Assessment & Plan:   Problem List Items Addressed This Visit      Cardiovascular and Mediastinum   Atrial fibrillation ([place])    The current medical regimen is effective;  continue present plan and medications.       Relevant Medications   benazepril (LOTENSIN) 40 MG tablet   hydrochlorothiazide (HYDRODIURIL) 12.5 MG tablet   metoprolol succinate (TOPROL-XL) 25 MG 24 hr tablet   warfarin (COUMADIN) 1 MG tablet   warfarin (COUMADIN) 4 MG tablet   Other Relevant Orders   CBC with Differential/Platelet   TSH   Urinalysis, Routine w reflex microscopic (not at [facility])   Hypertension    The current medical regimen is effective;  continue present plan and medications.       Relevant Medications   benazepril (LOTENSIN)  40 MG tablet   hydrochlorothiazide (HYDRODIURIL) 12.5 MG tablet   metoprolol succinate (TOPROL-XL) 25 MG 24 hr tablet   warfarin (COUMADIN) 1 MG tablet   warfarin (COUMADIN) 4 MG tablet   Other Relevant Orders   Comprehensive metabolic panel   CBC with Differential/Platelet   TSH   Urinalysis, Routine w reflex microscopic (not at [facility])     Endocrine   Diabetes mellitus without complication ([place])    The current medical regimen is effective;  continue present plan and medications.        Relevant Medications   benazepril (LOTENSIN) 40 MG tablet   metFORMIN (GLUCOPHAGE-XR) 500 MG 24 hr tablet   Other Relevant Orders   Lipid panel   CBC with Differential/Platelet   TSH   Urinalysis, Routine w reflex microscopic (not at [facility])     Musculoskeletal and Integument   Lumbar degenerative disc disease    Working with back doctors probably can have surgery for spinal stenosis later this year.        Genitourinary   BPH with obstruction/lower urinary tract symptoms    Discuss BPH with lower urinary tract symptoms discuss double voiding, fluid restriction, will add Proscar to tamsulosin If not helpful in symptoms persist will consider urology consult.      Relevant Medications   tamsulosin (FLOMAX) 0.4 MG CAPS capsule   finasteride (PROSCAR) 5 MG tablet   Other Relevant Orders   PSA    Other Visit Diagnoses    Health care maintenance    -  Primary    Relevant Orders    Pneumococcal polysaccharide vaccine 23-valent greater than or equal to 2yo subcutaneous/IM    Immunization due        Relevant Orders    Pneumococcal polysaccharide vaccine 23-valent greater than or equal to 2yo subcutaneous/IM    Abrasion of neck, initial encounter        Laceration        Wound abscess, initial encounter        Open wound          Nose wound patient education given on care treatment will give tetanus shot Tried to get tetanus shot covered by wounds but difficulty with covered diagnosis tried multiple diagnosis finally printed a advance beneficiary notice Follow up plan: Return in about 2 months (around 08/02/2015) for a1c .

## 2015-06-02 NOTE — Assessment & Plan Note (Signed)
The current medical regimen is effective;  continue present plan and medications.  

## 2015-06-02 NOTE — Assessment & Plan Note (Signed)
Working with back doctors probably can have surgery for spinal stenosis later this year.

## 2015-06-02 NOTE — Assessment & Plan Note (Signed)
Discuss BPH with lower urinary tract symptoms discuss double voiding, fluid restriction, will add Proscar to tamsulosin If not helpful in symptoms persist will consider urology consult.

## 2015-06-03 ENCOUNTER — Telehealth: Payer: Self-pay | Admitting: Family Medicine

## 2015-06-03 DIAGNOSIS — D649 Anemia, unspecified: Secondary | ICD-10-CM

## 2015-06-03 LAB — CBC WITH DIFFERENTIAL/PLATELET
Basophils Absolute: 0 10*3/uL (ref 0.0–0.2)
Basos: 0 %
EOS (ABSOLUTE): 0.1 10*3/uL (ref 0.0–0.4)
Eos: 1 %
Hematocrit: 36.2 % — ABNORMAL LOW (ref 37.5–51.0)
Hemoglobin: 12.7 g/dL (ref 12.6–17.7)
Immature Grans (Abs): 0 10*3/uL (ref 0.0–0.1)
Immature Granulocytes: 0 %
Lymphocytes Absolute: 1.9 10*3/uL (ref 0.7–3.1)
Lymphs: 31 %
MCH: 30.9 pg (ref 26.6–33.0)
MCHC: 35.1 g/dL (ref 31.5–35.7)
MCV: 88 fL (ref 79–97)
Monocytes Absolute: 0.7 10*3/uL (ref 0.1–0.9)
Monocytes: 11 %
Neutrophils Absolute: 3.5 10*3/uL (ref 1.4–7.0)
Neutrophils: 57 %
Platelets: 148 10*3/uL — ABNORMAL LOW (ref 150–379)
RBC: 4.11 x10E6/uL — ABNORMAL LOW (ref 4.14–5.80)
RDW: 13.9 % (ref 12.3–15.4)
WBC: 6.2 10*3/uL (ref 3.4–10.8)

## 2015-06-03 LAB — COMPREHENSIVE METABOLIC PANEL
ALT: 16 IU/L (ref 0–44)
AST: 20 IU/L (ref 0–40)
Albumin/Globulin Ratio: 1.8 (ref 1.2–2.2)
Albumin: 4.3 g/dL (ref 3.5–4.8)
Alkaline Phosphatase: 46 IU/L (ref 39–117)
BUN/Creatinine Ratio: 15 (ref 10–24)
BUN: 16 mg/dL (ref 8–27)
Bilirubin Total: 0.6 mg/dL (ref 0.0–1.2)
CO2: 24 mmol/L (ref 18–29)
Calcium: 9.4 mg/dL (ref 8.6–10.2)
Chloride: 100 mmol/L (ref 96–106)
Creatinine, Ser: 1.07 mg/dL (ref 0.76–1.27)
GFR calc Af Amer: 80 mL/min/{1.73_m2} (ref >59)
GFR calc non Af Amer: 69 mL/min/{1.73_m2} (ref >59)
Globulin, Total: 2.4 g/dL (ref 1.5–4.5)
Glucose: 131 mg/dL — ABNORMAL HIGH (ref 65–99)
Potassium: 4.5 mmol/L (ref 3.5–5.2)
Sodium: 141 mmol/L (ref 134–144)
Total Protein: 6.7 g/dL (ref 6.0–8.5)

## 2015-06-03 LAB — LIPID PANEL
Chol/HDL Ratio: 3.7 ratio units (ref 0.0–5.0)
Cholesterol, Total: 160 mg/dL (ref 100–199)
HDL: 43 mg/dL (ref >39)
LDL Calculated: 95 mg/dL (ref 0–99)
Triglycerides: 109 mg/dL (ref 0–149)
VLDL Cholesterol Cal: 22 mg/dL (ref 5–40)

## 2015-06-03 LAB — PSA: Prostate Specific Ag, Serum: 0.9 ng/mL (ref 0.0–4.0)

## 2015-06-03 LAB — TSH: TSH: 4.12 u[IU]/mL (ref 0.450–4.500)

## 2015-06-03 NOTE — Telephone Encounter (Signed)
Phone call Discussed with patient hemoglobin is been up and down slightly will recheck CBC next INR.

## 2015-06-17 ENCOUNTER — Ambulatory Visit
Admission: RE | Admit: 2015-06-17 | Discharge: 2015-06-17 | Disposition: A | Discharge status: Home / Self Care | Payer: Medicare Other | Source: Ambulatory Visit | Attending: Neurosurgery | Admitting: Neurosurgery

## 2015-06-17 DIAGNOSIS — M48061 Spinal stenosis, lumbar region without neurogenic claudication: Secondary | ICD-10-CM

## 2015-06-17 DIAGNOSIS — M5136 Other intervertebral disc degeneration, lumbar region: Secondary | ICD-10-CM | POA: Insufficient documentation

## 2015-06-17 DIAGNOSIS — M4806 Spinal stenosis, lumbar region: Secondary | ICD-10-CM | POA: Diagnosis not present

## 2015-06-22 DIAGNOSIS — M4807 Spinal stenosis, lumbosacral region: Secondary | ICD-10-CM | POA: Diagnosis not present

## 2015-06-22 DIAGNOSIS — M5137 Other intervertebral disc degeneration, lumbosacral region: Secondary | ICD-10-CM | POA: Diagnosis not present

## 2015-06-30 ENCOUNTER — Ambulatory Visit (INDEPENDENT_AMBULATORY_CARE_PROVIDER_SITE_OTHER): Payer: Medicare Other | Admitting: Family Medicine

## 2015-06-30 ENCOUNTER — Encounter: Payer: Self-pay | Admitting: Family Medicine

## 2015-06-30 VITALS — BP 103/65 | HR 59 | Temp 98.1°F | Ht 71.2 in | Wt 207.0 lb

## 2015-06-30 DIAGNOSIS — M5136 Other intervertebral disc degeneration, lumbar region: Secondary | ICD-10-CM | POA: Diagnosis not present

## 2015-06-30 DIAGNOSIS — I4891 Unspecified atrial fibrillation: Secondary | ICD-10-CM | POA: Diagnosis not present

## 2015-06-30 DIAGNOSIS — I10 Essential (primary) hypertension: Secondary | ICD-10-CM

## 2015-06-30 DIAGNOSIS — D649 Anemia, unspecified: Secondary | ICD-10-CM | POA: Insufficient documentation

## 2015-06-30 LAB — CBC WITH DIFFERENTIAL/PLATELET
Hematocrit: 35.5 % — ABNORMAL LOW (ref 37.5–51.0)
Hemoglobin: 12.4 g/dL — ABNORMAL LOW (ref 12.6–17.7)
Lymphocytes Absolute: 2.4 10*3/uL (ref 0.7–3.1)
Lymphs: 39 %
MCH: 32 pg (ref 26.6–33.0)
MCHC: 34.9 g/dL (ref 31.5–35.7)
MCV: 92 fL (ref 79–97)
MID (Absolute): 0.7 10*3/uL (ref 0.1–1.6)
MID: 11 %
Neutrophils Absolute: 3.1 10*3/uL (ref 1.4–7.0)
Neutrophils: 50 %
Platelets: 158 10*3/uL (ref 150–379)
RBC: 3.87 x10E6/uL — ABNORMAL LOW (ref 4.14–5.80)
RDW: 14.2 % (ref 12.3–15.4)
WBC: 6.2 10*3/uL (ref 3.4–10.8)

## 2015-06-30 LAB — COAGUCHEK XS/INR WAIVED
INR: 2.1 — ABNORMAL HIGH (ref 0.9–1.1)
Prothrombin Time: 24.7 s

## 2015-06-30 NOTE — Progress Notes (Signed)
BP 103/65 mmHg  Pulse 59  Temp(Src) 98.1 F (36.7 C)  Ht 5' 11.2" (1.808 m)  Wt 207 lb (93.895 kg)  BMI 28.72 kg/m2  SpO2 96%   Subjective:    Patient ID: Sean Hacker., male    DOB: Jul 13, 1940, 75 y.o.   MRN: [id number]  HPI: Sean Cordle. is a 75 y.o. male  Chief Complaint  Patient presents with  . Atrial Fibrillation  Patient with multiple concerns A. fib relatively stable taking metoprolol 25 twice a day with reasonable control Taking warfarin 4 mg alternating with 4.5 mg with INR today of 2.1 and good control with no noticed bleeding bruising issues. Patient's had marked fatigue issues noted with low blood pressure like it is today has stopped hydrochlorothiazide which was started started for elevated blood pressure which has resolved and still having low blood pressure will try reducing into Prill from 40-to 20 mg observing blood pressure response.  Biggest concern today patient's monitoring hemoglobin which is been a slow downward drift has continued Downward drift of 12.7 a month ago down to 12.4. Will refer to GI. Patient with no obvious bleeding.  Patient also with backs out of whack working with neurosurgery but fatigue is been ongoing longer. Relevant past medical, surgical, family and social history reviewed and updated as indicated. Interim medical history since our last visit reviewed. Allergies and medications reviewed and updated.  Review of Systems  Constitutional: Positive for fatigue. Negative for chills and diaphoresis.  Respiratory: Negative.   Cardiovascular: Negative.     Per HPI unless specifically indicated above     Objective:    BP 103/65 mmHg  Pulse 59  Temp(Src) 98.1 F (36.7 C)  Ht 5' 11.2" (1.808 m)  Wt 207 lb (93.895 kg)  BMI 28.72 kg/m2  SpO2 96%  Wt Readings from Last 3 Encounters:  06/30/15 207 lb (93.895 kg)  06/02/15 207 lb (93.895 kg)  05/31/15 211 lb (95.709 kg)    Physical Exam  Constitutional: He is oriented to  person, place, and time. He appears well-developed and well-nourished. No distress.  HENT:  Head: Normocephalic and atraumatic.  Right Ear: Hearing normal.  Left Ear: Hearing normal.  Nose: Nose normal.  Eyes: Conjunctivae and lids are normal. Right eye exhibits no discharge. Left eye exhibits no discharge. No scleral icterus.  Pulmonary/Chest: Effort normal. No respiratory distress.  Musculoskeletal: Normal range of motion.  Neurological: He is alert and oriented to person, place, and time.  Skin: Skin is intact. No rash noted.  Psychiatric: He has a normal mood and affect. His speech is normal and behavior is normal. Judgment and thought content normal. Cognition and memory are normal.    Results for orders placed or performed in visit on 06/02/15  Microscopic Examination  Result Value Ref Range   WBC, UA 0-5 0 -  5 /hpf   RBC, UA 0-2 0 -  2 /hpf   Epithelial Cells (non renal) 0-10 0 - 10 /hpf   Bacteria, UA Few None seen/Few  Comprehensive metabolic panel  Result Value Ref Range   Glucose 131 (H) 65 - 99 mg/dL   BUN 16 8 - 27 mg/dL   Creatinine, Ser 1.07 0.76 - 1.27 mg/dL   GFR calc non Af Amer 69 >59 mL/min/1.73   GFR calc Af Amer 80 >59 mL/min/1.73   BUN/Creatinine Ratio 15 10 - 24   Sodium 141 134 - 144 mmol/L   Potassium 4.5 3.5 - 5.2 mmol/L  Chloride 100 96 - 106 mmol/L   CO2 24 18 - 29 mmol/L   Calcium 9.4 8.6 - 10.2 mg/dL   Total Protein 6.7 6.0 - 8.5 g/dL   Albumin 4.3 3.5 - 4.8 g/dL   Globulin, Total 2.4 1.5 - 4.5 g/dL   Albumin/Globulin Ratio 1.8 1.2 - 2.2   Bilirubin Total 0.6 0.0 - 1.2 mg/dL   Alkaline Phosphatase 46 39 - 117 IU/L   AST 20 0 - 40 IU/L   ALT 16 0 - 44 IU/L  Lipid panel  Result Value Ref Range   Cholesterol, Total 160 100 - 199 mg/dL   Triglycerides 109 0 - 149 mg/dL   HDL 43 >39 mg/dL   VLDL Cholesterol Cal 22 5 - 40 mg/dL   LDL Calculated 95 0 - 99 mg/dL   Chol/HDL Ratio 3.7 0.0 - 5.0 ratio units  CBC with Differential/Platelet   Result Value Ref Range   WBC 6.2 3.4 - 10.8 x10E3/uL   RBC 4.11 (L) 4.14 - 5.80 x10E6/uL   Hemoglobin 12.7 12.6 - 17.7 g/dL   Hematocrit 36.2 (L) 37.5 - 51.0 %   MCV 88 79 - 97 fL   MCH 30.9 26.6 - 33.0 pg   MCHC 35.1 31.5 - 35.7 g/dL   RDW 13.9 12.3 - 15.4 %   Platelets 148 (L) 150 - 379 x10E3/uL   Neutrophils 57 %   Lymphs 31 %   Monocytes 11 %   Eos 1 %   Basos 0 %   Neutrophils Absolute 3.5 1.4 - 7.0 x10E3/uL   Lymphocytes Absolute 1.9 0.7 - 3.1 x10E3/uL   Monocytes Absolute 0.7 0.1 - 0.9 x10E3/uL   EOS (ABSOLUTE) 0.1 0.0 - 0.4 x10E3/uL   Basophils Absolute 0.0 0.0 - 0.2 x10E3/uL   Immature Granulocytes 0 %   Immature Grans (Abs) 0.0 0.0 - 0.1 x10E3/uL  TSH  Result Value Ref Range   TSH 4.120 0.450 - 4.500 uIU/mL  Urinalysis, Routine w reflex microscopic (not at [facility])  Result Value Ref Range   Specific Gravity, UA 1.025 1.005 - 1.030   pH, UA 5.0 5.0 - 7.5   Color, UA Yellow Yellow   Appearance Ur Clear Clear   Leukocytes, UA Trace (A) Negative   Protein, UA Negative Negative/Trace   Glucose, UA Negative Negative   Ketones, UA Negative Negative   RBC, UA Trace (A) Negative   Bilirubin, UA Negative Negative   Urobilinogen, Ur 0.2 0.2 - 1.0 mg/dL   Nitrite, UA Negative Negative   Microscopic Examination See below:   PSA  Result Value Ref Range   Prostate Specific Ag, Serum 0.9 0.0 - 4.0 ng/mL      Assessment & Plan:   Problem List Items Addressed This Visit      Cardiovascular and Mediastinum   Atrial fibrillation ([place]) - Primary    The current medical regimen is effective;  continue present plan and medications.       Relevant Orders   CoaguChek XS/INR Waived   Ambulatory referral to Gastroenterology   Hypertension    Discussed possibility of over medication for hypertension we will stay off hydrochlorothiazide decrease Benzapril to 20 mg will not adjust metoprolol because A fibs doing better than it has for some time         Musculoskeletal and  Integument   Lumbar degenerative disc disease    The current medical regimen is effective;  continue present plan and medications.  Other   Anemia    Patient taking warfarin on a chronic basis for atrial fibrillation has been steady but has very slow decline in hemoglobin with slow downward drift area and will refer to GI [facility] to further evaluate.      Relevant Orders   Ambulatory referral to Gastroenterology       Follow up plan: Return in about 4 weeks (around 07/28/2015) for A1c.

## 2015-06-30 NOTE — Assessment & Plan Note (Signed)
The current medical regimen is effective;  continue present plan and medications.  

## 2015-06-30 NOTE — Assessment & Plan Note (Addendum)
Discussed possibility of over medication for hypertension we will stay off hydrochlorothiazide decrease Benzapril to 20 mg will not adjust metoprolol because A fibs doing better than it has for some time

## 2015-06-30 NOTE — Assessment & Plan Note (Signed)
Patient taking warfarin on a chronic basis for atrial fibrillation has been steady but has very slow decline in hemoglobin with slow downward drift area and will refer to GI [facility] to further evaluate.

## 2015-07-01 DIAGNOSIS — M545 Low back pain: Secondary | ICD-10-CM | POA: Diagnosis not present

## 2015-07-01 DIAGNOSIS — R262 Difficulty in walking, not elsewhere classified: Secondary | ICD-10-CM | POA: Diagnosis not present

## 2015-07-07 DIAGNOSIS — M545 Low back pain: Secondary | ICD-10-CM | POA: Diagnosis not present

## 2015-07-07 DIAGNOSIS — R262 Difficulty in walking, not elsewhere classified: Secondary | ICD-10-CM | POA: Diagnosis not present

## 2015-07-09 DIAGNOSIS — M545 Low back pain: Secondary | ICD-10-CM | POA: Diagnosis not present

## 2015-07-09 DIAGNOSIS — R262 Difficulty in walking, not elsewhere classified: Secondary | ICD-10-CM | POA: Diagnosis not present

## 2015-07-14 DIAGNOSIS — I10 Essential (primary) hypertension: Secondary | ICD-10-CM | POA: Diagnosis not present

## 2015-07-14 DIAGNOSIS — E1169 Type 2 diabetes mellitus with other specified complication: Secondary | ICD-10-CM | POA: Insufficient documentation

## 2015-07-14 DIAGNOSIS — M545 Low back pain: Secondary | ICD-10-CM | POA: Diagnosis not present

## 2015-07-14 DIAGNOSIS — G4733 Obstructive sleep apnea (adult) (pediatric): Secondary | ICD-10-CM | POA: Diagnosis not present

## 2015-07-14 DIAGNOSIS — E782 Mixed hyperlipidemia: Secondary | ICD-10-CM | POA: Diagnosis not present

## 2015-07-14 DIAGNOSIS — Z79899 Other long term (current) drug therapy: Secondary | ICD-10-CM | POA: Diagnosis not present

## 2015-07-14 DIAGNOSIS — E785 Hyperlipidemia, unspecified: Secondary | ICD-10-CM | POA: Insufficient documentation

## 2015-07-14 DIAGNOSIS — I480 Paroxysmal atrial fibrillation: Secondary | ICD-10-CM | POA: Diagnosis not present

## 2015-07-14 DIAGNOSIS — R262 Difficulty in walking, not elsewhere classified: Secondary | ICD-10-CM | POA: Diagnosis not present

## 2015-07-15 DIAGNOSIS — R262 Difficulty in walking, not elsewhere classified: Secondary | ICD-10-CM | POA: Diagnosis not present

## 2015-07-15 DIAGNOSIS — M545 Low back pain: Secondary | ICD-10-CM | POA: Diagnosis not present

## 2015-07-16 DIAGNOSIS — M545 Low back pain: Secondary | ICD-10-CM | POA: Diagnosis not present

## 2015-07-16 DIAGNOSIS — R262 Difficulty in walking, not elsewhere classified: Secondary | ICD-10-CM | POA: Diagnosis not present

## 2015-07-20 DIAGNOSIS — M545 Low back pain: Secondary | ICD-10-CM | POA: Diagnosis not present

## 2015-07-20 DIAGNOSIS — R262 Difficulty in walking, not elsewhere classified: Secondary | ICD-10-CM | POA: Diagnosis not present

## 2015-07-21 DIAGNOSIS — R5383 Other fatigue: Secondary | ICD-10-CM | POA: Diagnosis not present

## 2015-07-21 DIAGNOSIS — R1013 Epigastric pain: Secondary | ICD-10-CM | POA: Diagnosis not present

## 2015-07-21 DIAGNOSIS — D649 Anemia, unspecified: Secondary | ICD-10-CM | POA: Diagnosis not present

## 2015-07-21 DIAGNOSIS — Z1211 Encounter for screening for malignant neoplasm of colon: Secondary | ICD-10-CM | POA: Diagnosis not present

## 2015-07-23 DIAGNOSIS — M545 Low back pain: Secondary | ICD-10-CM | POA: Diagnosis not present

## 2015-07-23 DIAGNOSIS — R262 Difficulty in walking, not elsewhere classified: Secondary | ICD-10-CM | POA: Diagnosis not present

## 2015-07-27 DIAGNOSIS — M5137 Other intervertebral disc degeneration, lumbosacral region: Secondary | ICD-10-CM | POA: Diagnosis not present

## 2015-07-30 DIAGNOSIS — M545 Low back pain: Secondary | ICD-10-CM | POA: Diagnosis not present

## 2015-07-30 DIAGNOSIS — R262 Difficulty in walking, not elsewhere classified: Secondary | ICD-10-CM | POA: Diagnosis not present

## 2015-08-04 ENCOUNTER — Ambulatory Visit (INDEPENDENT_AMBULATORY_CARE_PROVIDER_SITE_OTHER): Payer: Medicare Other | Admitting: Family Medicine

## 2015-08-04 ENCOUNTER — Encounter: Payer: Self-pay | Admitting: Family Medicine

## 2015-08-04 VITALS — BP 123/75 | HR 56 | Temp 98.1°F | Ht 71.2 in | Wt 207.0 lb

## 2015-08-04 DIAGNOSIS — I4891 Unspecified atrial fibrillation: Secondary | ICD-10-CM

## 2015-08-04 DIAGNOSIS — N183 Chronic kidney disease, stage 3 unspecified: Secondary | ICD-10-CM

## 2015-08-04 DIAGNOSIS — I129 Hypertensive chronic kidney disease with stage 1 through stage 4 chronic kidney disease, or unspecified chronic kidney disease: Secondary | ICD-10-CM

## 2015-08-04 DIAGNOSIS — I10 Essential (primary) hypertension: Secondary | ICD-10-CM | POA: Diagnosis not present

## 2015-08-04 DIAGNOSIS — R5382 Chronic fatigue, unspecified: Secondary | ICD-10-CM | POA: Diagnosis not present

## 2015-08-04 DIAGNOSIS — E119 Type 2 diabetes mellitus without complications: Secondary | ICD-10-CM | POA: Diagnosis not present

## 2015-08-04 LAB — COAGUCHEK XS/INR WAIVED
INR: 1.7 — ABNORMAL HIGH (ref 0.9–1.1)
Prothrombin Time: 20.9 s

## 2015-08-04 LAB — BAYER DCA HB A1C WAIVED: HB A1C (BAYER DCA - WAIVED): 7 % — ABNORMAL HIGH (ref <7.0)

## 2015-08-04 NOTE — Progress Notes (Signed)
BP 123/75 mmHg  Pulse 56  Temp(Src) 98.1 F (36.7 C)  Ht 5' 11.2" (1.808 m)  Wt 207 lb (93.895 kg)  BMI 28.72 kg/m2  SpO2 99%   Subjective:    Patient ID: Sean Hacker., male    DOB: 12/22/1940, 75 y.o.   MRN: [id number]  HPI: Sean Surti. is a 75 y.o. male  Chief Complaint  Patient presents with  . Diabetes  . Atrial Fibrillation  Patient recheck A. fib doing well taking warfarin faithfully INR is low which is not usually normal no change in medicine for some time no bleeding bruising issues Has been to GI has colonoscopy scheduled maybe endoscopy reviewed blood works some tendency towards iron deficiency Diabetes hemoglobin A1c of 7 Patient also with ongoing fatigue wondered about low testosterone levels.  Relevant past medical, surgical, family and social history reviewed and updated as indicated. Interim medical history since our last visit reviewed. Allergies and medications reviewed and updated.  Review of Systems  Constitutional: Positive for fatigue.  Respiratory: Negative.   Cardiovascular: Negative.     Per HPI unless specifically indicated above     Objective:    BP 123/75 mmHg  Pulse 56  Temp(Src) 98.1 F (36.7 C)  Ht 5' 11.2" (1.808 m)  Wt 207 lb (93.895 kg)  BMI 28.72 kg/m2  SpO2 99%  Wt Readings from Last 3 Encounters:  08/04/15 207 lb (93.895 kg)  06/30/15 207 lb (93.895 kg)  06/02/15 207 lb (93.895 kg)    Physical Exam  Constitutional: He is oriented to person, place, and time. He appears well-developed and well-nourished. No distress.  HENT:  Head: Normocephalic and atraumatic.  Right Ear: Hearing normal.  Left Ear: Hearing normal.  Nose: Nose normal.  Eyes: Conjunctivae and lids are normal. Right eye exhibits no discharge. Left eye exhibits no discharge. No scleral icterus.  Cardiovascular: Normal rate, regular rhythm and normal heart sounds.   Pulmonary/Chest: Effort normal and breath sounds normal. No respiratory distress.   Musculoskeletal: Normal range of motion.  Neurological: He is alert and oriented to person, place, and time.  Skin: Skin is intact. No rash noted.  Psychiatric: He has a normal mood and affect. His speech is normal and behavior is normal. Judgment and thought content normal. Cognition and memory are normal.    Results for orders placed or performed in visit on 06/30/15  CoaguChek XS/INR Waived  Result Value Ref Range   INR 2.1 (H) 0.9 - 1.1   Prothrombin Time 24.7 sec  CBC With Differential/Platelet  Result Value Ref Range   WBC 6.2 3.4 - 10.8 x10E3/uL   RBC 3.87 (L) 4.14 - 5.80 x10E6/uL   Hemoglobin 12.4 (L) 12.6 - 17.7 g/dL   Hematocrit 35.5 (L) 37.5 - 51.0 %   MCV 92 79 - 97 fL   MCH 32.0 26.6 - 33.0 pg   MCHC 34.9 31.5 - 35.7 g/dL   RDW 14.2 12.3 - 15.4 %   Platelets 158 150 - 379 x10E3/uL   Neutrophils 50 %   Lymphs 39 %   MID 11 %   Neutrophils Absolute 3.1 1.4 - 7.0 x10E3/uL   Lymphocytes Absolute 2.4 0.7 - 3.1 x10E3/uL   MID (Absolute) 0.7 0.1 - 1.6 X10E3/uL      Assessment & Plan:   Problem List Items Addressed This Visit      Cardiovascular and Mediastinum   Atrial fibrillation ([facility]) - Primary    Discuss INR low for the first time  and sometimes is otherwise been stable patient will double check medication make sure his been getting every day otherwise no dosing and change recheck INR 1 month      Relevant Orders   CoaguChek XS/INR Waived   Hypertension    The current medical regimen is effective;  continue present plan and medications.         Endocrine   Diabetes mellitus without complication ([place])    The current medical regimen is effective;  continue present plan and medications.       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        Other Visit Diagnoses    Chronic fatigue        We'll  check testosterone        Follow up plan: Return in about 4 weeks (around 09/01/2015) for INR 1 month along with testosterone if of 4:10 AM.

## 2015-08-04 NOTE — Assessment & Plan Note (Signed)
The current medical regimen is effective;  continue present plan and medications.  

## 2015-08-04 NOTE — Assessment & Plan Note (Signed)
Discuss INR low for the first time and sometimes is otherwise been stable patient will double check medication make sure his been getting every day otherwise no dosing and change recheck INR 1 month

## 2015-08-05 DIAGNOSIS — R262 Difficulty in walking, not elsewhere classified: Secondary | ICD-10-CM | POA: Diagnosis not present

## 2015-08-05 DIAGNOSIS — M545 Low back pain: Secondary | ICD-10-CM | POA: Diagnosis not present

## 2015-08-12 DIAGNOSIS — M545 Low back pain: Secondary | ICD-10-CM | POA: Diagnosis not present

## 2015-08-12 DIAGNOSIS — R262 Difficulty in walking, not elsewhere classified: Secondary | ICD-10-CM | POA: Diagnosis not present

## 2015-08-19 DIAGNOSIS — R262 Difficulty in walking, not elsewhere classified: Secondary | ICD-10-CM | POA: Diagnosis not present

## 2015-08-19 DIAGNOSIS — M545 Low back pain: Secondary | ICD-10-CM | POA: Diagnosis not present

## 2015-09-01 ENCOUNTER — Encounter: Payer: Self-pay | Admitting: Family Medicine

## 2015-09-01 ENCOUNTER — Ambulatory Visit (INDEPENDENT_AMBULATORY_CARE_PROVIDER_SITE_OTHER): Payer: Medicare Other | Admitting: Family Medicine

## 2015-09-01 VITALS — BP 127/73 | HR 66 | Temp 98.2°F | Wt 206.0 lb

## 2015-09-01 DIAGNOSIS — I4891 Unspecified atrial fibrillation: Secondary | ICD-10-CM | POA: Diagnosis not present

## 2015-09-01 DIAGNOSIS — R5383 Other fatigue: Secondary | ICD-10-CM

## 2015-09-01 LAB — COAGUCHEK XS/INR WAIVED
INR: 3 — ABNORMAL HIGH (ref 0.9–1.1)
Prothrombin Time: 35.4 s

## 2015-09-01 NOTE — Assessment & Plan Note (Signed)
lab

## 2015-09-01 NOTE — Progress Notes (Signed)
   BP 127/73 mmHg  Pulse 66  Temp(Src) 98.2 F (36.8 C)  Wt 206 lb (93.441 kg)  SpO2 98%   Subjective:    Patient ID: Sean Hacker., male    DOB: 04/25/1940, 75 y.o.   MRN: [id number]  HPI: Sean Hilinski. is a 75 y.o. male  Chief Complaint  Patient presents with  . Atrial Fibrillation    recheck PT/INR, he currently alternates 4mg  and 4.5mg   . Fatigue    Dr.Crissman mentioned he might need to have his Testosterone levels checked. He'd like to have that done today.   Patient doing well on an alternating dose of 4 and 4.5 mg daily. Taking faithfully with no bleeding/bruising issues. No concerns today.  Relevant past medical, surgical, family and social history reviewed and updated as indicated. Interim medical history since our last visit reviewed. Allergies and medications reviewed and updated.  Review of Systems  Constitutional: Negative.   HENT: Negative for nosebleeds.   Respiratory: Negative.   Cardiovascular: Negative.   Gastrointestinal: Negative for blood in stool.  Genitourinary: Negative.   Skin: Negative.   Hematological: Negative.   Psychiatric/Behavioral: Negative.     Per HPI unless specifically indicated above     Objective:    BP 127/73 mmHg  Pulse 66  Temp(Src) 98.2 F (36.8 C)  Wt 206 lb (93.441 kg)  SpO2 98%  Wt Readings from Last 3 Encounters:  09/01/15 206 lb (93.441 kg)  08/04/15 207 lb (93.895 kg)  06/30/15 207 lb (93.895 kg)    Physical Exam  Constitutional: He is oriented to person, place, and time. He appears well-developed and well-nourished. No distress.  HENT:  Head: Atraumatic.  Eyes: Conjunctivae are normal. No scleral icterus.  Neck: Normal range of motion. Neck supple.  Cardiovascular: Normal rate.   Pulmonary/Chest: Effort normal.  Musculoskeletal: Normal range of motion.  Neurological: He is alert and oriented to person, place, and time.  Skin: Skin is warm and dry.  Psychiatric: He has a normal mood and affect. His  behavior is normal.  Nursing note and vitals reviewed.       Assessment & Plan:   Problem List Items Addressed This Visit      Cardiovascular and Mediastinum   Atrial fibrillation ([place]) - Primary    lab      Relevant Orders   CoaguChek XS/INR Waived    Other Visit Diagnoses    Other fatigue        Relevant Orders    Testosterone, Free, Total, SHBG     INR at goal today. Continue current dose.  Will check testosterone today as discussed at previous visit.   Follow up plan: Return in about 4 weeks (around 09/29/2015) for INR.

## 2015-09-01 NOTE — Patient Instructions (Signed)
Follow up as needed

## 2015-09-02 DIAGNOSIS — M545 Low back pain: Secondary | ICD-10-CM | POA: Diagnosis not present

## 2015-09-02 DIAGNOSIS — R262 Difficulty in walking, not elsewhere classified: Secondary | ICD-10-CM | POA: Diagnosis not present

## 2015-09-02 LAB — TESTOSTERONE, FREE, TOTAL, SHBG
Sex Hormone Binding: 30.7 nmol/L (ref 19.3–76.4)
Testosterone, Free: 6 pg/mL — ABNORMAL LOW (ref 6.6–18.1)
Testosterone: 198 ng/dL — ABNORMAL LOW (ref 264–916)

## 2015-09-03 ENCOUNTER — Telehealth: Payer: Self-pay | Admitting: Family Medicine

## 2015-09-03 DIAGNOSIS — R7989 Other specified abnormal findings of blood chemistry: Secondary | ICD-10-CM

## 2015-09-03 NOTE — Telephone Encounter (Signed)
Patient notified. He would like to go to Urology. Will you go ahead and enter the referral?

## 2015-09-03 NOTE — Telephone Encounter (Signed)
Referral placed.

## 2015-09-03 NOTE — Telephone Encounter (Signed)
Please call patient and let him know that his testosterone did come back low, and that we would be happy to refer him to Urology for replacement if he would like to pursue that but it is also fine to not take any action.

## 2015-09-06 ENCOUNTER — Telehealth: Payer: Self-pay | Admitting: Family Medicine

## 2015-09-06 DIAGNOSIS — M545 Low back pain: Secondary | ICD-10-CM

## 2015-09-06 NOTE — Telephone Encounter (Signed)
Pt would like referral to [facility].  He would like to see Dr Wylene Men. States this is for back pain.

## 2015-09-06 NOTE — Telephone Encounter (Signed)
Referral placed.

## 2015-09-21 ENCOUNTER — Ambulatory Visit (INDEPENDENT_AMBULATORY_CARE_PROVIDER_SITE_OTHER): Payer: Medicare Other | Admitting: Urology

## 2015-09-21 ENCOUNTER — Encounter: Payer: Self-pay | Admitting: Urology

## 2015-09-21 VITALS — BP 161/78 | HR 64 | Ht 73.0 in | Wt 208.1 lb

## 2015-09-21 DIAGNOSIS — E291 Testicular hypofunction: Secondary | ICD-10-CM | POA: Diagnosis not present

## 2015-09-21 DIAGNOSIS — N401 Enlarged prostate with lower urinary tract symptoms: Secondary | ICD-10-CM | POA: Diagnosis not present

## 2015-09-21 DIAGNOSIS — N138 Other obstructive and reflux uropathy: Secondary | ICD-10-CM

## 2015-09-21 NOTE — Progress Notes (Signed)
09/21/2015 1:54 PM   Sean Reyes. 01/25/1941 [id number]  Referring provider: [name], MD [address] [place], [place] [address]  Chief Complaint  Patient presents with  . Hypogonadism    referred by Dr. Jeananne Rama for low testosterone    HPI: Patient is a 75 year old Caucasian male referred by Dr. Jeananne Rama for low testosterone.  Hypogonadism Patient is experiencing a decrease in libido, a lack of energy, a decrease in strength, erections being less strong, a recent deterioration in an ability to play sports, falling asleep after dinner and a recent deterioration in their work performance.  This is indicated by his responses to the ADAM questionnaire.  He is no longer having spontaneous erections at night.   He has sleep apnea and is sleeping with a CPAP machine.  He has sleep apnea and is sleeping with a CPAP machine.  His pretreatment testosterone level was 198 ng/dL on 09/01/2015.        Androgen Deficiency in the Aging Male    [place] Name 09/21/15 1300         Androgen Deficiency in the Aging Male   Do you have a decrease in libido (sex drive) Yes     Do you have lack of energy Yes     Do you have a decrease in strength and/or endurance Yes     Have you lost height No     Have you noticed a decreased "enjoyment of life" No     Are you sad and/or grumpy No     Are your erections less strong Yes     Have you noticed a recent deterioration in your ability to play sports Yes     Are you falling asleep after dinner Yes     Has there been a recent deterioration in your work performance Yes       BPH WITH LUTS His IPSS score today is 17, which is moderate lower urinary tract symptomatology. He is mixed with his quality life due to his urinary symptoms.  His major complaint today frequency, urgency and nocturia.  He has had these symptoms for several years.  He denies any dysuria, hematuria or suprapubic pain.  He currently taking tamsulosin 0.4 mg and finasteride 5 mg  daily.  He also denies any recent fevers, chills, nausea or vomiting.   He does not have a family history of PCa.      IPSS    Row Name 09/21/15 1300         International Prostate Symptom Score   How often have you had the sensation of not emptying your bladder? Less than 1 in 5     How often have you had to urinate less than every two hours? More than half the time     How often have you found you stopped and started again several times when you urinated? Less than half the time     How often have you found it difficult to postpone urination? Almost always     How often have you had a weak urinary stream? About half the time     How often have you had to strain to start urination? Not at All     How many times did you typically get up at night to urinate? 2 Times     Total IPSS Score 17       Quality of Life due to urinary symptoms   If you were to spend  the rest of your life with your urinary condition just the way it is now how would you feel about that? Mixed        Score:  1-7 Mild 8-19 Moderate 20-35 Severe    PMH: Past Medical History:  Diagnosis Date  . Anxiety   . Arthritis   . Atrial fibrillation ([place])   . Depression   . Diabetes ([place])   . Hyperlipidemia   . Hypertension   . S/P ablation of atrial fibrillation    Ablative therapy  . Sleep apnea   . Tachycardia, unspecified     Surgical History: Past Surgical History:  Procedure Laterality Date  . ABLATION    . HERNIA REPAIR    . JOINT REPLACEMENT Bilateral    hips  . TONSILLECTOMY      Home Medications:    Medication List       Accurate as of 09/21/15  1:54 PM. Always use your most recent med list.          b complex vitamins tablet Take 1 tablet by mouth daily.   benazepril 40 MG tablet Commonly known as:  LOTENSIN Take 1 tablet (40 mg total) by mouth daily.   finasteride 5 MG tablet Commonly known as:  PROSCAR Take 1 tablet (5 mg total) by mouth daily.   metFORMIN 500 MG 24 hr  tablet Commonly known as:  GLUCOPHAGE-XR TAKE (1) TABLET TWICE A DAY.   metoprolol succinate 25 MG 24 hr tablet Commonly known as:  TOPROL-XL Take 1 tablet (25 mg total) by mouth 2 (two) times daily.   propafenone 425 MG 12 hr capsule Commonly known as:  RYTHMOL SR Take 425 mg by mouth 2 (two) times daily.   tamsulosin 0.4 MG Caps capsule Commonly known as:  FLOMAX 1 Capsule daily ORAL   warfarin 1 MG tablet Commonly known as:  COUMADIN Take 0.5 tablets (0.5 mg total) by mouth every other day. take as directed   warfarin 4 MG tablet Commonly known as:  COUMADIN Take 1 tablet (4 mg total) by mouth one time only at 6 PM.       Allergies:  Allergies  Allergen Reactions  . Shellfish Allergy Anaphylaxis  . Amiodarone Other (See Comments)    Tremors and thyroid toxicity  . Levaquin [Levofloxacin In D5w]     Family History: Family History  Problem Relation Age of Onset  . Cancer Mother   . Kidney disease Neg Hx   . Prostate cancer Neg Hx     Social History:  reports that he has never smoked. He has never used smokeless tobacco. He reports that he does not drink alcohol or use drugs.  ROS: UROLOGY Frequent Urination?: Yes Hard to postpone urination?: Yes Burning/pain with urination?: No Get up at night to urinate?: Yes Leakage of urine?: No Urine stream starts and stops?: No Trouble starting stream?: No Do you have to strain to urinate?: No Blood in urine?: No Urinary tract infection?: No Sexually transmitted disease?: No Injury to kidneys or bladder?: No Painful intercourse?: No Weak stream?: No Erection problems?: Yes Penile pain?: No  Gastrointestinal Nausea?: No Vomiting?: No Indigestion/heartburn?: No Diarrhea?: No Constipation?: No  Constitutional Fever: No Night sweats?: No Weight loss?: No Fatigue?: Yes  Skin Skin rash/lesions?: No Itching?: No  Eyes Blurred vision?: No Double vision?: No  Ears/Nose/Throat Sore throat?: No Sinus  problems?: No  Hematologic/Lymphatic Swollen glands?: No Easy bruising?: No  Cardiovascular Leg swelling?: No Chest pain?: No  Respiratory Cough?:  No Shortness of breath?: No  Endocrine Excessive thirst?: No  Musculoskeletal Back pain?: Yes Joint pain?: Yes  Neurological Headaches?: No Dizziness?: No  Psychologic Depression?: No Anxiety?: No  Physical Exam: BP (!) 161/78   Pulse 64   Ht 6\' 1"  (1.854 m)   Wt 208 lb 1.6 oz (94.4 kg)   BMI 27.46 kg/m   Constitutional: Well nourished. Alert and oriented, No acute distress. HEENT: [place] AT, moist mucus membranes. Trachea midline, no masses. Cardiovascular: No clubbing, cyanosis, or edema. Respiratory: Normal respiratory effort, no increased work of breathing. GI: Abdomen is soft, non tender, non distended, no abdominal masses. Liver and spleen not palpable.  No hernias appreciated.  Stool sample for occult testing is not indicated.   GU: No CVA tenderness.  No bladder fullness or masses.  Patient with circumcised phallus.  Urethral meatus is patent.  No penile discharge. No penile lesions or rashes. Scrotum without lesions, cysts, rashes and/or edema.  Testicles are located scrotally bilaterally. No masses are appreciated in the testicles. Left and right epididymis are normal. Rectal: Patient with  normal sphincter tone. Anus and perineum without scarring or rashes. No rectal masses are appreciated. Prostate is approximately 50 grams, no nodules are appreciated. Seminal vesicles are normal. Skin: No rashes, bruises or suspicious lesions. Lymph: No cervical or inguinal adenopathy. Neurologic: Grossly intact, no focal deficits, moving all 4 extremities. Psychiatric: Normal mood and affect.  Laboratory Data: Lab Results  Component Value Date   WBC 6.2 06/30/2015   HGB 14.1 08/08/2014   HCT 35.5 (L) 06/30/2015   MCV 92 06/30/2015   PLT 158 06/30/2015    Lab Results  Component Value Date   CREATININE 1.07 06/02/2015     PSA History  0.9 ng/mL on 06/02/2015   Lab Results  Component Value Date   TESTOSTERONE 198 (L) 09/01/2015     Lab Results  Component Value Date   TSH 4.120 06/02/2015       Component Value Date/Time   CHOL 160 06/02/2015 0955   HDL 43 06/02/2015 0955   CHOLHDL 3.7 06/02/2015 0955   LDLCALC 95 06/02/2015 0955    Lab Results  Component Value Date   AST 20 06/02/2015   Lab Results  Component Value Date   ALT 16 06/02/2015     Assessment & Plan:    1. Hypogonadism I explained to patient that the current recommendations from the Endocrine Society reports the diagnosis of hypogonadism requires a serum total testosterone level obtained between 8 and 10 AM at least 2 days apart that is below the laboratory parameters  for normal testosterone.    At this time, the patient does not meet this requirement.  He will return in the morning before 10 AM for his second serum testosterone draw.   I discussed with the patient the side effects of testosterone therapy, such as: enlargement of the prostate gland that may in turn cause LUTS, possible increased risk of PCa, DVT's and/or PE's, possible increased risk of heart attack or stroke, lower sperm count, swelling of the ankles, feet, or body, with or without heart failure, enlarged or painful breasts, have problems breathing while you sleep (sleep apnea), increased prostate specific antigen, mood swings, hypertension and increased red blood cell count.  I also discussed that some men have had success using clomid for hypogonadism.  It does seem to be more successful in younger men, but there are incidences of good results in middle-aged men.  I explained that it is  used in male infertility to stimulate the testicles to make more testosterone/sperm.  There has been no long term data on side effects, but some urologists has been having success with this medication.   He would like to try Clomid if he meets criteria for hypogonadism.     2. BPH with LUTS  - IPSS score is 17/3  - Continue conservative management, avoiding bladder irritants and timed voiding's  - Continue tamsulosin 0.4 mg daily and finasteride 5 mg daily  - RTC in 6 months for IPSS, PSA and exam    Return for return in the am before 10 AM for second testosterone.  These notes generated with voice recognition software. I apologize for typographical errors.  Zara Council, [facility] [address], [place], [place] [address] [phone]

## 2015-09-22 ENCOUNTER — Other Ambulatory Visit: Payer: Self-pay

## 2015-09-22 DIAGNOSIS — E291 Testicular hypofunction: Secondary | ICD-10-CM

## 2015-09-23 ENCOUNTER — Other Ambulatory Visit: Payer: Medicare Other

## 2015-09-23 DIAGNOSIS — E291 Testicular hypofunction: Secondary | ICD-10-CM

## 2015-09-24 ENCOUNTER — Telehealth: Payer: Self-pay

## 2015-09-24 LAB — TESTOSTERONE: Testosterone: 245 ng/dL — ABNORMAL LOW (ref 264–916)

## 2015-09-24 NOTE — Telephone Encounter (Signed)
-----   Message from Nori Riis, PA-C sent at 09/24/2015  8:33 AM EDT ----- Please add [place], LH and prolactin to his blood work.  He would also like to start Clomid 50 mg, 1/2 tablet daily.  Please call the patient and see what pharmacy he would like to use.

## 2015-09-24 NOTE — Telephone Encounter (Signed)
Labs added.

## 2015-09-27 ENCOUNTER — Telehealth: Payer: Self-pay

## 2015-09-27 DIAGNOSIS — E291 Testicular hypofunction: Secondary | ICD-10-CM

## 2015-09-27 MED ORDER — CLOMIPHENE CITRATE 50 MG PO TABS: ORAL_TABLET | ORAL | 0 refills | Status: DC

## 2015-09-27 NOTE — Telephone Encounter (Signed)
Spoke with pt wife in reference to lab results and medication. Wife voiced understanding.

## 2015-09-27 NOTE — Telephone Encounter (Signed)
-----   Message from Nori Riis, PA-C sent at 09/25/2015  1:34 PM EDT ----- Labs are normal. There is no sign of pituitary tumor. He would like to initiate Clomid 50 mg, one half tablet and we will recheck a morning testosterone, before 10 AM, in 1 month.

## 2015-09-28 LAB — FSH/LH
FSH: 6.5 m[IU]/mL (ref 1.5–12.4)
LH: 3.3 m[IU]/mL (ref 1.7–8.6)

## 2015-09-28 LAB — PROLACTIN: Prolactin: 10 ng/mL (ref 4.0–15.2)

## 2015-09-28 LAB — SPECIMEN STATUS REPORT

## 2015-10-04 ENCOUNTER — Ambulatory Visit: Payer: Medicare Other | Admitting: Family Medicine

## 2015-10-05 ENCOUNTER — Ambulatory Visit: Payer: Medicare Other | Admitting: Anesthesiology

## 2015-10-05 ENCOUNTER — Encounter: Payer: Self-pay | Admitting: *Deleted

## 2015-10-05 ENCOUNTER — Ambulatory Visit
Admission: RE | Admit: 2015-10-05 | Discharge: 2015-10-05 | Disposition: A | Discharge status: Home / Self Care | Payer: Medicare Other | Source: Ambulatory Visit | Attending: Gastroenterology | Admitting: Gastroenterology

## 2015-10-05 ENCOUNTER — Encounter
Admission: RE | Disposition: A | Discharge status: Home / Self Care | Payer: Self-pay | Source: Ambulatory Visit | Attending: Gastroenterology

## 2015-10-05 DIAGNOSIS — Z7901 Long term (current) use of anticoagulants: Secondary | ICD-10-CM | POA: Insufficient documentation

## 2015-10-05 DIAGNOSIS — K5520 Angiodysplasia of colon without hemorrhage: Secondary | ICD-10-CM | POA: Insufficient documentation

## 2015-10-05 DIAGNOSIS — Z881 Allergy status to other antibiotic agents status: Secondary | ICD-10-CM | POA: Insufficient documentation

## 2015-10-05 DIAGNOSIS — F419 Anxiety disorder, unspecified: Secondary | ICD-10-CM | POA: Diagnosis not present

## 2015-10-05 DIAGNOSIS — E785 Hyperlipidemia, unspecified: Secondary | ICD-10-CM | POA: Insufficient documentation

## 2015-10-05 DIAGNOSIS — N189 Chronic kidney disease, unspecified: Secondary | ICD-10-CM | POA: Diagnosis not present

## 2015-10-05 DIAGNOSIS — Z1211 Encounter for screening for malignant neoplasm of colon: Secondary | ICD-10-CM | POA: Insufficient documentation

## 2015-10-05 DIAGNOSIS — I4891 Unspecified atrial fibrillation: Secondary | ICD-10-CM | POA: Diagnosis not present

## 2015-10-05 DIAGNOSIS — Z79899 Other long term (current) drug therapy: Secondary | ICD-10-CM | POA: Diagnosis not present

## 2015-10-05 DIAGNOSIS — M1990 Unspecified osteoarthritis, unspecified site: Secondary | ICD-10-CM | POA: Insufficient documentation

## 2015-10-05 DIAGNOSIS — D649 Anemia, unspecified: Secondary | ICD-10-CM | POA: Diagnosis not present

## 2015-10-05 DIAGNOSIS — Z888 Allergy status to other drugs, medicaments and biological substances status: Secondary | ICD-10-CM | POA: Diagnosis not present

## 2015-10-05 DIAGNOSIS — I129 Hypertensive chronic kidney disease with stage 1 through stage 4 chronic kidney disease, or unspecified chronic kidney disease: Secondary | ICD-10-CM | POA: Insufficient documentation

## 2015-10-05 DIAGNOSIS — K5790 Diverticulosis of intestine, part unspecified, without perforation or abscess without bleeding: Secondary | ICD-10-CM | POA: Diagnosis not present

## 2015-10-05 DIAGNOSIS — K5730 Diverticulosis of large intestine without perforation or abscess without bleeding: Secondary | ICD-10-CM | POA: Insufficient documentation

## 2015-10-05 DIAGNOSIS — Z91013 Allergy to seafood: Secondary | ICD-10-CM | POA: Insufficient documentation

## 2015-10-05 DIAGNOSIS — F329 Major depressive disorder, single episode, unspecified: Secondary | ICD-10-CM | POA: Insufficient documentation

## 2015-10-05 DIAGNOSIS — G4730 Sleep apnea, unspecified: Secondary | ICD-10-CM | POA: Diagnosis not present

## 2015-10-05 DIAGNOSIS — E119 Type 2 diabetes mellitus without complications: Secondary | ICD-10-CM | POA: Insufficient documentation

## 2015-10-05 DIAGNOSIS — Z7984 Long term (current) use of oral hypoglycemic drugs: Secondary | ICD-10-CM | POA: Insufficient documentation

## 2015-10-05 HISTORY — PX: COLONOSCOPY WITH PROPOFOL: SHX5780

## 2015-10-05 LAB — PROTIME-INR
INR: 1.37
Prothrombin Time: 17 seconds — ABNORMAL HIGH (ref 11.4–15.2)

## 2015-10-05 LAB — GLUCOSE, CAPILLARY: Glucose-Capillary: 131 mg/dL — ABNORMAL HIGH (ref 65–99)

## 2015-10-05 SURGERY — COLONOSCOPY WITH PROPOFOL
Anesthesia: General

## 2015-10-05 MED ORDER — PHENYLEPHRINE HCL 10 MG/ML IJ SOLN
INTRAMUSCULAR | Status: DC | PRN
  Administered 2015-10-05: 100 ug via INTRAVENOUS

## 2015-10-05 MED ORDER — LIDOCAINE 2% (20 MG/ML) 5 ML SYRINGE
INTRAMUSCULAR | Status: DC | PRN
  Administered 2015-10-05: 40 mg via INTRAVENOUS

## 2015-10-05 MED ORDER — SODIUM CHLORIDE 0.9 % IV SOLN
INTRAVENOUS | Status: DC
  Administered 2015-10-05: 10:00:00 via INTRAVENOUS

## 2015-10-05 MED ORDER — MIDAZOLAM HCL 5 MG/5ML IJ SOLN
INTRAMUSCULAR | Status: DC | PRN
  Administered 2015-10-05: 1 mg via INTRAVENOUS

## 2015-10-05 MED ORDER — FENTANYL CITRATE (PF) 100 MCG/2ML IJ SOLN
INTRAMUSCULAR | Status: DC | PRN
  Administered 2015-10-05: 50 ug via INTRAVENOUS

## 2015-10-05 MED ORDER — PROPOFOL 500 MG/50ML IV EMUL
INTRAVENOUS | Status: DC | PRN
  Administered 2015-10-05: 160 ug/kg/min via INTRAVENOUS

## 2015-10-05 MED ORDER — PROPOFOL 10 MG/ML IV BOLUS
INTRAVENOUS | Status: DC | PRN
  Administered 2015-10-05: 100 mg via INTRAVENOUS

## 2015-10-05 MED ORDER — SODIUM CHLORIDE 0.9 % IV SOLN: INTRAVENOUS | Status: DC

## 2015-10-05 NOTE — Transfer of Care (Signed)
Immediate Anesthesia Transfer of Care Note  Patient: [name].  Procedure(s) Performed: Procedure(s): COLONOSCOPY WITH PROPOFOL (N/A)  Patient Location: PACU and Endoscopy Unit  Anesthesia Type:General  Level of Consciousness: sedated  Airway & Oxygen Therapy: Patient Spontanous Breathing and Patient connected to nasal cannula oxygen  Post-op Assessment: Report given to RN and Post -op Vital signs reviewed and stable  Post vital signs: Reviewed and stable  Last Vitals:  Vitals:   10/05/15 0749  BP: (!) 165/84  Pulse: (!) 54  Resp: 18  Temp: (!) 36 C    Last Pain:  Vitals:   10/05/15 0749  TempSrc: Tympanic         Complications: No apparent anesthesia complications

## 2015-10-05 NOTE — Op Note (Signed)
[facility] Gastroenterology Patient Name: Sean Reyes Procedure Date: 10/05/2015 10:30 AM MRN: [id number] Account #: [id number] Date of Birth: 06/29/1940 Admit Type: Outpatient Age: 75 Room: [facility] ENDO ROOM 3 Gender: Male Note Status: Finalized Procedure:            Colonoscopy Indications:          Screening for colorectal malignant neoplasm Providers:            Lollie Sails, MD Referring MD:         [name], MD (Referring MD) Medicines:            Monitored Anesthesia Care Complications:        No immediate complications. Procedure:            Pre-Anesthesia Assessment:                       - ASA Grade Assessment: III - A patient with severe                        systemic disease.                       After obtaining informed consent, the colonoscope was                        passed under direct vision. Throughout the procedure,                        the patient's blood pressure, pulse, and oxygen                        saturations were monitored continuously. The                        Colonoscope was introduced through the anus and                        advanced to the the cecum, identified by appendiceal                        orifice and ileocecal valve. The colonoscopy was                        performed without difficulty. The patient tolerated the                        procedure well. The quality of the bowel preparation                        was good. Findings:      A few medium-mouthed diverticula were found in the sigmoid colon and       distal descending colon.      The digital rectal exam was normal.      Multiple tiny, less than 1 mm, with a scant distribution, angioectasias       without bleeding were found in the sigmoid colon, in the descending       colon, in the transverse colon and in the ascending colon.      The exam was otherwise without abnormality. Impression:           - Diverticulosis in  the sigmoid colon  and in the distal                        descending colon.                       - Multiple non-bleeding colonic angioectasias.                       - The examination was otherwise normal.                       - No specimens collected. Recommendation:       - Discharge patient to home.                       - Repeat colonoscopy in 10 years for screening purposes. Procedure Code(s):    --- Professional ---                       [phone], Colonoscopy, flexible; diagnostic, including                        collection of specimen(s) by brushing or washing, when                        performed (separate procedure) Diagnosis Code(s):    --- Professional ---                       Z12.11, Encounter for screening for malignant neoplasm                        of colon                       K55.20, Angiodysplasia of colon without hemorrhage                       K57.30, Diverticulosis of large intestine without                        perforation or abscess without bleeding CPT copyright 2016 American Medical Association. All rights reserved. The codes documented in this report are preliminary and upon coder review may  be revised to meet current compliance requirements. Lollie Sails, MD 10/05/2015 11:01:47 AM This report has been signed electronically. Number of Addenda: 0 Note Initiated On: 10/05/2015 10:30 AM Scope Withdrawal Time: 0 hours 8 minutes 29 seconds  Total Procedure Duration: 0 hours 15 minutes 56 seconds       [facility]

## 2015-10-05 NOTE — Anesthesia Postprocedure Evaluation (Signed)
Anesthesia Post Note  Patient: [name].  Procedure(s) Performed: Procedure(s) (LRB): COLONOSCOPY WITH PROPOFOL (N/A)  Patient location during evaluation: Endoscopy Anesthesia Type: General Level of consciousness: awake and alert and oriented Pain management: pain level controlled Vital Signs Assessment: post-procedure vital signs reviewed and stable Respiratory status: spontaneous breathing, nonlabored ventilation and respiratory function stable Cardiovascular status: blood pressure returned to baseline and stable Postop Assessment: no signs of nausea or vomiting Anesthetic complications: no    Last Vitals:  Vitals:   10/05/15 1120 10/05/15 1130  BP: (!) 152/84 (!) 146/80  Pulse:    Resp:    Temp:      Last Pain:  Vitals:   10/05/15 1100  TempSrc: Tympanic                 Philicia Heyne

## 2015-10-05 NOTE — Anesthesia Preprocedure Evaluation (Signed)
Anesthesia Evaluation  Patient identified by MRN, date of birth, ID band Patient awake    Reviewed: Allergy & Precautions, NPO status , Patient's Chart, lab work & pertinent test results, reviewed documented beta blocker date and time   History of Anesthesia Complications Negative for: history of anesthetic complications  Airway Mallampati: II  TM Distance: >3 FB Neck ROM: Full    Dental no notable dental hx.    Pulmonary sleep apnea and Continuous Positive Airway Pressure Ventilation , neg COPD,    breath sounds clear to auscultation- rhonchi (-) wheezing      Cardiovascular Exercise Tolerance: Good hypertension, Pt. on medications and Pt. on home beta blockers + dysrhythmias Atrial Fibrillation  Rhythm:Regular Rate:Normal - Systolic murmurs and - Diastolic murmurs    Neuro/Psych Anxiety Depression negative neurological ROS     GI/Hepatic negative GI ROS, Neg liver ROS,   Endo/Other  diabetes, Type 2, Oral Hypoglycemic Agents  Renal/GU CRFRenal disease     Musculoskeletal  (+) Arthritis , Osteoarthritis,    Abdominal (+) - obese,   Peds  Hematology  (+) anemia ,   Anesthesia Other Findings Past Medical History: No date: Anxiety No date: Arthritis No date: Atrial fibrillation ([facility]) No date: Depression No date: Diabetes ([facility]) No date: Hyperlipidemia No date: Hypertension No date: S/P ablation of atrial fibrillation     Comment: Ablative therapy No date: Sleep apnea No date: Tachycardia, unspecified   Reproductive/Obstetrics                             Anesthesia Physical Anesthesia Plan  ASA: III  Anesthesia Plan: General   Post-op Pain Management:    Induction: Intravenous  Airway Management Planned: Natural Airway  Additional Equipment:   Intra-op Plan:   Post-operative Plan:   Informed Consent: I have reviewed the patients History and Physical, chart, labs and  discussed the procedure including the risks, benefits and alternatives for the proposed anesthesia with the patient or authorized representative who has indicated his/her understanding and acceptance.   Dental advisory given  Plan Discussed with: CRNA and Anesthesiologist  Anesthesia Plan Comments:         Anesthesia Quick Evaluation

## 2015-10-05 NOTE — H&P (Signed)
Outpatient short stay form Pre-procedure 10/05/2015 10:32 AM Lollie Sails MD  Primary Physician: Dr. Kelli Churn  Reason for visit:  Colon cancer screening  History of present illness:  Patient is a 75 year old male presenting today as above. He tolerated his prep well. He does take Coumadin which has been held for several days. INR was checked this morning and was 1.37. Did discuss with patient that this was a little above we would normally consider for a elective procedure. Patient however wishes to proceed and is aware of some amount of increased risk of bleeding. We discussed multiple options in regards to waiting until tomorrow proceeding till today and if something unusual was noted that required extensive biopsy we would need to bring him back tomorrow. He takes no other blood thinning agents or aspirin products.    Current Facility-Administered Medications:  .  0.9 %  sodium chloride infusion, , Intravenous, Continuous, Lollie Sails, MD, Last Rate: 20 mL/hr at 10/05/15 0931 .  0.9 %  sodium chloride infusion, , Intravenous, Continuous, Lollie Sails, MD  Prescriptions Prior to Admission  Medication Sig Dispense Refill Last Dose  . b complex vitamins tablet Take 1 tablet by mouth daily.   Past Week at Unknown time  . benazepril (LOTENSIN) 40 MG tablet Take 1 tablet (40 mg total) by mouth daily. 30 tablet 12 Past Week at Unknown time  . metFORMIN (GLUCOPHAGE-XR) 500 MG 24 hr tablet TAKE (1) TABLET TWICE A DAY. 60 tablet 12 Past Week at Unknown time  . metoprolol succinate (TOPROL-XL) 25 MG 24 hr tablet Take 1 tablet (25 mg total) by mouth 2 (two) times daily. 60 tablet 12 10/05/2015 at 0630  . propafenone (RYTHMOL SR) 425 MG 12 hr capsule Take 425 mg by mouth 2 (two) times daily.   10/05/2015 at 0630  . tamsulosin (FLOMAX) 0.4 MG CAPS capsule 1 Capsule daily ORAL 30 capsule 12 Past Week at Unknown time  . warfarin (COUMADIN) 1 MG tablet Take 0.5 tablets (0.5 mg total) by  mouth every other day. take as directed (Patient taking differently: Take 4 mg by mouth every other day. As directed) 30 tablet 1 09/30/2015  . warfarin (COUMADIN) 4 MG tablet Take 1 tablet (4 mg total) by mouth one time only at 6 PM. 30 tablet 1 09/30/2015  . clomiPHENE (CLOMID) 50 MG tablet 1/2 tablet daily (Patient not taking: Reported on 10/05/2015) 30 tablet 0  at Unknown time  . finasteride (PROSCAR) 5 MG tablet Take 1 tablet (5 mg total) by mouth daily. 30 tablet 12  at unknown     Allergies  Allergen Reactions  . Shellfish Allergy Anaphylaxis  . Amiodarone Other (See Comments)    Tremors and thyroid toxicity  . Levaquin [Levofloxacin In D5w]      Past Medical History:  Diagnosis Date  . Anxiety   . Arthritis   . Atrial fibrillation ([place])   . Depression   . Diabetes ([place])   . Hyperlipidemia   . Hypertension   . S/P ablation of atrial fibrillation    Ablative therapy  . Sleep apnea   . Tachycardia, unspecified     Review of systems:      Physical Exam    Heart and lungs: Irregularly irregular bilaterally clear to auscultation    HEENT: Norm cephalic atraumatic eyes are anicteric    Other:     Pertinant exam for procedure: Soft nontender nondistended bowel sounds positive normoactive.    Planned proceedures: Colonoscopy and  indicated procedures. I have discussed the risks benefits and complications of procedures to include not limited to bleeding, infection, perforation and the risk of sedation and the patient wishes to proceed.    Lollie Sails, MD Gastroenterology 10/05/2015  10:32 AM

## 2015-10-06 ENCOUNTER — Other Ambulatory Visit: Payer: Self-pay

## 2015-10-06 ENCOUNTER — Encounter: Payer: Self-pay | Admitting: Gastroenterology

## 2015-10-06 DIAGNOSIS — Z79899 Other long term (current) drug therapy: Secondary | ICD-10-CM | POA: Diagnosis not present

## 2015-10-06 DIAGNOSIS — M47816 Spondylosis without myelopathy or radiculopathy, lumbar region: Secondary | ICD-10-CM | POA: Diagnosis not present

## 2015-10-06 DIAGNOSIS — M4806 Spinal stenosis, lumbar region: Secondary | ICD-10-CM | POA: Diagnosis not present

## 2015-10-06 DIAGNOSIS — Z7901 Long term (current) use of anticoagulants: Secondary | ICD-10-CM

## 2015-10-06 DIAGNOSIS — Z5181 Encounter for therapeutic drug level monitoring: Secondary | ICD-10-CM | POA: Diagnosis not present

## 2015-10-07 ENCOUNTER — Ambulatory Visit (INDEPENDENT_AMBULATORY_CARE_PROVIDER_SITE_OTHER): Payer: Medicare Other | Admitting: Family Medicine

## 2015-10-07 ENCOUNTER — Encounter: Payer: Self-pay | Admitting: Family Medicine

## 2015-10-07 VITALS — BP 161/84 | HR 55 | Temp 97.9°F | Wt 208.8 lb

## 2015-10-07 DIAGNOSIS — I10 Essential (primary) hypertension: Secondary | ICD-10-CM | POA: Diagnosis not present

## 2015-10-07 DIAGNOSIS — Z7901 Long term (current) use of anticoagulants: Secondary | ICD-10-CM | POA: Diagnosis not present

## 2015-10-07 DIAGNOSIS — I4891 Unspecified atrial fibrillation: Secondary | ICD-10-CM | POA: Diagnosis not present

## 2015-10-07 DIAGNOSIS — E291 Testicular hypofunction: Secondary | ICD-10-CM

## 2015-10-07 DIAGNOSIS — I480 Paroxysmal atrial fibrillation: Secondary | ICD-10-CM | POA: Diagnosis not present

## 2015-10-07 DIAGNOSIS — R7989 Other specified abnormal findings of blood chemistry: Secondary | ICD-10-CM | POA: Insufficient documentation

## 2015-10-07 LAB — COAGUCHEK XS/INR WAIVED
INR: 1.1 (ref 0.9–1.1)
Prothrombin Time: 13.6 s

## 2015-10-07 NOTE — Assessment & Plan Note (Signed)
Discussed patient is been off warfarin for preparation for colonoscopy which turned out just fine patient now back on his usual dose of warfarin discuss with INR at 1.1 will take 8 mg warfarin today then back to usual dose will recheck INR 2 weeks

## 2015-10-07 NOTE — Assessment & Plan Note (Signed)
Discuss low testosterone patient is working with urology was given Clomid reviewed and after discussion patient will further discuss with urology.

## 2015-10-07 NOTE — Progress Notes (Signed)
BP (!) 161/84 (BP Location: Left Arm, Patient Position: Sitting, Cuff Size: Normal)   Pulse (!) 55   Temp 97.9 F (36.6 C)   Wt 208 lb 12.8 oz (94.7 kg) Comment: pt had shoes on  SpO2 98%   BMI 27.55 kg/m    Subjective:    Patient ID: Sean Reyes., male    DOB: 1940-05-31, 75 y.o.   MRN: [id number]  HPI: Sean Reyes. is a 75 y.o. male  Chief Complaint  Patient presents with  . Atrial Fibrillation    PT/INr check  Patient just had colonoscopy which was normal at stopped warfarin. Has restarted back in usual dose. No bleeding bruising issues Back is somewhat better is going to another back doctor got some injections last week and feels a little better. A. fib is stable  Relevant past medical, surgical, family and social history reviewed and updated as indicated. Interim medical history since our last visit reviewed. Allergies and medications reviewed and updated.  Review of Systems  Constitutional: Negative.   Respiratory: Negative.   Cardiovascular: Negative.     Per HPI unless specifically indicated above     Objective:    BP (!) 161/84 (BP Location: Left Arm, Patient Position: Sitting, Cuff Size: Normal)   Pulse (!) 55   Temp 97.9 F (36.6 C)   Wt 208 lb 12.8 oz (94.7 kg) Comment: pt had shoes on  SpO2 98%   BMI 27.55 kg/m   Wt Readings from Last 3 Encounters:  10/07/15 208 lb 12.8 oz (94.7 kg)  10/05/15 207 lb (93.9 kg)  09/21/15 208 lb 1.6 oz (94.4 kg)    Physical Exam  Constitutional: He is oriented to person, place, and time. He appears well-developed and well-nourished. No distress.  HENT:  Head: Normocephalic and atraumatic.  Right Ear: Hearing normal.  Left Ear: Hearing normal.  Nose: Nose normal.  Eyes: Conjunctivae and lids are normal. Right eye exhibits no discharge. Left eye exhibits no discharge. No scleral icterus.  Cardiovascular: Normal heart sounds.   Pulmonary/Chest: Effort normal and breath sounds normal. No respiratory  distress.  Musculoskeletal: Normal range of motion.  Neurological: He is alert and oriented to person, place, and time.  Skin: Skin is intact. No rash noted.  Psychiatric: He has a normal mood and affect. His speech is normal and behavior is normal. Judgment and thought content normal. Cognition and memory are normal.    Results for orders placed or performed during the hospital encounter of 10/05/15  Protime-INR  Result Value Ref Range   Prothrombin Time 17.0 (H) 11.4 - 15.2 seconds   INR 1.37   Glucose, capillary  Result Value Ref Range   Glucose-Capillary 131 (H) 65 - 99 mg/dL      Assessment & Plan:   Problem List Items Addressed This Visit      Cardiovascular and Mediastinum   Atrial fibrillation ([place]) - Primary   Hypertension    The current medical regimen is effective;  continue present plan and medications.       AF (paroxysmal atrial fibrillation) ([place])    Discussed patient is been off warfarin for preparation for colonoscopy which turned out just fine patient now back on his usual dose of warfarin discuss with INR at 1.1 will take 8 mg warfarin today then back to usual dose will recheck INR 2 weeks        Other   Chronic anticoagulation   Low testosterone    Discuss low testosterone patient  is working with urology was given Clomid reviewed and after discussion patient will further discuss with urology.       Other Visit Diagnoses   None.      Follow up plan: Return in about 2 weeks (around 10/21/2015) for PT INR, Hemoglobin A1c.

## 2015-10-07 NOTE — Assessment & Plan Note (Signed)
The current medical regimen is effective;  continue present plan and medications.  

## 2015-10-15 ENCOUNTER — Other Ambulatory Visit: Payer: Self-pay | Admitting: Family Medicine

## 2015-10-15 NOTE — Telephone Encounter (Signed)
Routing to provider  

## 2015-10-21 ENCOUNTER — Ambulatory Visit (INDEPENDENT_AMBULATORY_CARE_PROVIDER_SITE_OTHER): Payer: Medicare Other | Admitting: Family Medicine

## 2015-10-21 ENCOUNTER — Encounter: Payer: Self-pay | Admitting: Family Medicine

## 2015-10-21 VITALS — BP 133/73 | HR 58 | Temp 97.7°F | Wt 205.0 lb

## 2015-10-21 DIAGNOSIS — I4891 Unspecified atrial fibrillation: Secondary | ICD-10-CM

## 2015-10-21 LAB — COAGUCHEK XS/INR WAIVED
INR: 2.5 — ABNORMAL HIGH (ref 0.9–1.1)
Prothrombin Time: 30.6 s

## 2015-10-21 NOTE — Progress Notes (Signed)
   BP 133/73 (BP Location: Left Arm, Patient Position: Sitting, Cuff Size: Large)   Pulse (!) 58   Temp 97.7 F (36.5 C)   Wt 205 lb (93 kg)   SpO2 99%   BMI 27.05 kg/m    Subjective:    Patient ID: Sean Hacker., male    DOB: [birth year], 75 y.o.   MRN: [id number]  CC: Coumadin management  HPI: This patient is a 75 y.o. male who presents for coumadin management. The expected duration of coumadin treatment is lifelong The reason for anticoagulation is  A. Fib.  Present Coumadin dose: 4, 4.5 alternating Goal: 2.0-3.0  Excessive bruising: no Nose bleeding: no Rectal bleeding: no Prolonged menstrual cycles: N/A Eating diet with consistent amounts of foods containing Vitamin K:yes Any recent antibiotic use? no  Relevant past medical, surgical, family and social history reviewed and updated as indicated. Interim medical history since our last visit reviewed. Allergies and medications reviewed and updated.  ROS: Per HPI unless specifically indicated above     Objective:    BP 133/73 (BP Location: Left Arm, Patient Position: Sitting, Cuff Size: Large)   Pulse (!) 58   Temp 97.7 F (36.5 C)   Wt 205 lb (93 kg)   SpO2 99%   BMI 27.05 kg/m   Wt Readings from Last 3 Encounters:  10/21/15 205 lb (93 kg)  10/07/15 208 lb 12.8 oz (94.7 kg)  10/05/15 207 lb (93.9 kg)     General: Well appearing, well nourished in no distress.  Normal mood and affect. Skin: No excessive bruising or rash  Last INR: 2.5 Last PT: 30.6    Last CBC:  Lab Results  Component Value Date   WBC 6.2 06/30/2015   HGB 14.1 08/08/2014   HCT 35.5 (L) 06/30/2015   MCV 92 06/30/2015   PLT 158 06/30/2015    Results for orders placed or performed in visit on 10/07/15  CoaguChek XS/INR Waived  Result Value Ref Range   INR 1.1 0.9 - 1.1   Prothrombin Time 13.6 sec       Assessment:     ICD-9-CM ICD-10-CM   1. Atrial fibrillation, unspecified type ([place]) 427.31 I48.91 CoaguChek XS/INR Waived     Plan:   Discussed current plan face-to-face with patient. For coumadin dosing, elected to continue current dose. Will plan to recheck INR in 1 month.

## 2015-10-26 DIAGNOSIS — E119 Type 2 diabetes mellitus without complications: Secondary | ICD-10-CM | POA: Diagnosis not present

## 2015-10-26 DIAGNOSIS — H2513 Age-related nuclear cataract, bilateral: Secondary | ICD-10-CM | POA: Diagnosis not present

## 2015-10-26 LAB — HM DIABETES EYE EXAM

## 2015-11-08 ENCOUNTER — Ambulatory Visit (INDEPENDENT_AMBULATORY_CARE_PROVIDER_SITE_OTHER): Payer: Medicare Other | Admitting: Urology

## 2015-11-08 ENCOUNTER — Encounter: Payer: Self-pay | Admitting: Urology

## 2015-11-08 VITALS — BP 166/82 | HR 60 | Ht 73.0 in | Wt 206.1 lb

## 2015-11-08 DIAGNOSIS — N401 Enlarged prostate with lower urinary tract symptoms: Secondary | ICD-10-CM | POA: Diagnosis not present

## 2015-11-08 DIAGNOSIS — E291 Testicular hypofunction: Secondary | ICD-10-CM | POA: Diagnosis not present

## 2015-11-08 DIAGNOSIS — N528 Other male erectile dysfunction: Secondary | ICD-10-CM | POA: Diagnosis not present

## 2015-11-08 DIAGNOSIS — N529 Male erectile dysfunction, unspecified: Secondary | ICD-10-CM

## 2015-11-08 DIAGNOSIS — N138 Other obstructive and reflux uropathy: Secondary | ICD-10-CM

## 2015-11-08 MED ORDER — CLOMIPHENE CITRATE 50 MG PO TABS: ORAL_TABLET | ORAL | 0 refills | Status: DC

## 2015-11-08 NOTE — Progress Notes (Signed)
11/08/2015 10:12 AM   Sean Reyes. 1940-07-04 [id number]  Referring provider: [name], MD [address] [place], [place] [address]  Chief Complaint  Patient presents with  . Hypogonadism    HPI: Patient is a 75 year old Caucasian male who presents today to discuss his low testosterone further after discussing the issue with folks in the community.    Hypogonadism Patient is experiencing a decrease in libido, a lack of energy, a decrease in strength,  erections being less strong, a recent deterioration in an ability to play sports, falling asleep after dinner and a recent deterioration in their work performance.  This is indicated by his responses to the ADAM questionnaire.   He is no longer having spontaneous erections at night.  He has sleep apnea and is sleeping with a CPAP machine.  His pretreatment testosterone level was 198 ng/dL on 09/01/2015 and 245 ng/dL on 09/23/2015.   He was prescribed Clomid 50 mg, 1/2 daily, but he wanted to discuss it further after he had spoken with people in the community.          Androgen Deficiency in the Aging Male    [place] Name 09/21/15 1300 11/08/15 0900       Androgen Deficiency in the Aging Male   Do you have a decrease in libido (sex drive) Yes Yes    Do you have lack of energy Yes Yes    Do you have a decrease in strength and/or endurance Yes Yes    Have you lost height No No    Have you noticed a decreased "enjoyment of life" No No    Are you sad and/or grumpy No No    Are your erections less strong Yes Yes    Have you noticed a recent deterioration in your ability to play sports Yes Yes    Are you falling asleep after dinner Yes Yes    Has there been a recent deterioration in your work performance Yes Yes       Erectile dysfunction His SHIM score is 5, which is severe ED.  He has been having difficulty with erections for several years.   His major complaint is no erections.  His libido is diminished.   His risk factors  for ED are age, BPH, hypogonadism, DM, HTN, HLD, sleep apnea, anxiety, depression, antidepressants, anticoagulants and blood pressure medications.   He denies any painful erections or curvatures with his erections.        SHIM    Row Name 11/08/15 0956         SHIM: Over the last 6 months:   How do you rate your confidence that you could get and keep an erection? Very Low     When you had erections with sexual stimulation, how often were your erections hard enough for penetration (entering your partner)? Almost Never or Never     During sexual intercourse, how often were you able to maintain your erection after you had penetrated (entered) your partner? Extremely Difficult     During sexual intercourse, how difficult was it to maintain your erection to completion of intercourse? Extremely Difficult     When you attempted sexual intercourse, how often was it satisfactory for you? Extremely Difficult       SHIM Total Score   SHIM 5        Score: 1-7 Severe ED 8-11 Moderate ED 12-16 Mild-Moderate ED 17-21 Mild ED 22-25 No ED  BPH WITH  LUTS His IPSS score today is 24, which is severe lower urinary tract symptomatology.  He is mostly dissatisfied with his quality life due to his urinary symptoms.  His previous IPSS score was 17/4.  His major complaint today frequency, urgency and nocturia.  He has had these symptoms for several years.  He denies any dysuria, hematuria or suprapubic pain.  He currently taking tamsulosin 0.4 mg and finasteride 5 mg daily.  He also denies any recent fevers, chills, nausea or vomiting.   He does not have a family history of PCa.      IPSS    Row Name 09/21/15 1300 11/08/15 0900       International Prostate Symptom Score   How often have you had the sensation of not emptying your bladder? Less than 1 in 5 More than half the time    How often have you had to urinate less than every two hours? More than half the time More than half the time    How often  have you found you stopped and started again several times when you urinated? Less than half the time More than half the time    How often have you found it difficult to postpone urination? Almost always More than half the time    How often have you had a weak urinary stream? About half the time More than half the time    How often have you had to strain to start urination? Not at All Less than 1 in 5 times    How many times did you typically get up at night to urinate? 2 Times 3 Times    Total IPSS Score 17 24      Quality of Life due to urinary symptoms   If you were to spend the rest of your life with your urinary condition just the way it is now how would you feel about that? Mixed Mostly Disatisfied       Score:  1-7 Mild 8-19 Moderate 20-35 Severe    PMH: Past Medical History:  Diagnosis Date  . Anxiety   . Arthritis   . Atrial fibrillation ([place])   . Depression   . Diabetes ([place])   . Hyperlipidemia   . Hypertension   . S/P ablation of atrial fibrillation    Ablative therapy  . Sleep apnea   . Tachycardia, unspecified     Surgical History: Past Surgical History:  Procedure Laterality Date  . ABLATION    . COLONOSCOPY WITH PROPOFOL N/A 10/05/2015   Procedure: COLONOSCOPY WITH PROPOFOL;  Surgeon: Lollie Sails, MD;  Location: [facility] ENDOSCOPY;  Service: Endoscopy;  Laterality: N/A;  . HERNIA REPAIR    . JOINT REPLACEMENT Bilateral    hips  . TONSILLECTOMY      Home Medications:    Medication List       Accurate as of 11/08/15 10:12 AM. Always use your most recent med list.          b complex vitamins tablet Take 1 tablet by mouth daily.   benazepril 40 MG tablet Commonly known as:  LOTENSIN Take 1 tablet (40 mg total) by mouth daily.   clomiPHENE 50 MG tablet Commonly known as:  CLOMID 1/2 tablet daily   finasteride 5 MG tablet Commonly known as:  PROSCAR Take 1 tablet (5 mg total) by mouth daily.   metFORMIN 500 MG 24 hr tablet Commonly  known as:  GLUCOPHAGE-XR TAKE (1) TABLET TWICE A DAY.   metoprolol  succinate 25 MG 24 hr tablet Commonly known as:  TOPROL-XL Take 1 tablet (25 mg total) by mouth 2 (two) times daily.   propafenone 425 MG 12 hr capsule Commonly known as:  RYTHMOL SR Take 425 mg by mouth 2 (two) times daily.   tamsulosin 0.4 MG Caps capsule Commonly known as:  FLOMAX 1 Capsule daily ORAL   warfarin 1 MG tablet Commonly known as:  COUMADIN Take 0.5 tablets (0.5 mg total) by mouth every other day. take as directed   warfarin 4 MG tablet Commonly known as:  COUMADIN Take 1 tablet (4 mg total) by mouth one time only at 6 PM.   warfarin 4 MG tablet Commonly known as:  COUMADIN Take 1 tablet (4 mg total) by mouth one time only at 6 PM.       Allergies:  Allergies  Allergen Reactions  . Shellfish Allergy Anaphylaxis  . Amiodarone Other (See Comments)    Tremors and thyroid toxicity  . Levaquin [Levofloxacin In D5w]     Family History: Family History  Problem Relation Age of Onset  . Cancer Mother   . Kidney disease Neg Hx   . Prostate cancer Neg Hx     Social History:  reports that he has never smoked. He has never used smokeless tobacco. He reports that he does not drink alcohol or use drugs.  ROS: UROLOGY Frequent Urination?: No Hard to postpone urination?: No Burning/pain with urination?: No Get up at night to urinate?: No Leakage of urine?: No Urine stream starts and stops?: No Trouble starting stream?: No Do you have to strain to urinate?: No Blood in urine?: No Urinary tract infection?: No Sexually transmitted disease?: No Injury to kidneys or bladder?: No Painful intercourse?: No Weak stream?: No Erection problems?: No Penile pain?: No  Gastrointestinal Nausea?: No Vomiting?: No Indigestion/heartburn?: No Diarrhea?: No Constipation?: No  Constitutional Fever: No Night sweats?: No Weight loss?: No Fatigue?: No  Skin Skin rash/lesions?: No Itching?:  No  Eyes Blurred vision?: No Double vision?: No  Ears/Nose/Throat Sore throat?: No Sinus problems?: No  Hematologic/Lymphatic Swollen glands?: No Easy bruising?: No  Cardiovascular Leg swelling?: No Chest pain?: No  Respiratory Cough?: No Shortness of breath?: No  Endocrine Excessive thirst?: No  Musculoskeletal Back pain?: No Joint pain?: No  Neurological Headaches?: No Dizziness?: No     Physical Exam: BP (!) 166/82 (BP Location: Left Arm, Patient Position: Sitting, Cuff Size: Normal)   Pulse 60   Ht 6\' 1"  (1.854 m)   Wt 206 lb 1.6 oz (93.5 kg)   BMI 27.19 kg/m   Constitutional: Well nourished. Alert and oriented, No acute distress. HEENT: [place] AT, moist mucus membranes. Trachea midline, no masses. Cardiovascular: No clubbing, cyanosis, or edema. Respiratory: Normal respiratory effort, no increased work of breathing. Skin: No rashes, bruises or suspicious lesions. Lymph: No cervical or inguinal adenopathy. Neurologic: Grossly intact, no focal deficits, moving all 4 extremities. Psychiatric: Normal mood and affect.  Laboratory Data: Lab Results  Component Value Date   WBC 6.2 06/30/2015   HGB 14.1 08/08/2014   HCT 35.5 (L) 06/30/2015   MCV 92 06/30/2015   PLT 158 06/30/2015    Lab Results  Component Value Date   CREATININE 1.07 06/02/2015   PSA History  0.9 ng/mL on 06/02/2015   Lab Results  Component Value Date   TESTOSTERONE 245 (L) 09/23/2015     Lab Results  Component Value Date   TSH 4.120 06/02/2015  Component Value Date/Time   CHOL 160 06/02/2015 0955   HDL 43 06/02/2015 0955   CHOLHDL 3.7 06/02/2015 0955   LDLCALC 95 06/02/2015 0955    Lab Results  Component Value Date   AST 20 06/02/2015   Lab Results  Component Value Date   ALT 16 06/02/2015     Assessment & Plan:    1. Hypogonadism  - I reviewed with the patient the side effects of testosterone therapy, such as: enlargement of the prostate gland  that may in turn cause LUTS, possible increased risk of PCa, DVT's and/or PE's, possible increased risk of heart attack or stroke, lower sperm count, swelling of the ankles, feet, or body, with or without heart failure, enlarged or painful breasts, have problems breathing while you sleep (sleep apnea), increased prostate specific antigen, mood swings, hypertension and increased red blood cell count.  - I also discussed that some men have had success using clomid for hypogonadism.  It does seem to be more successful in younger men, but there are incidences of good results in middle-aged men.  I explained that it is used in male infertility to stimulate the testicles to make more testosterone/sperm.  There has been no long term data on side effects, but some urologists has been having success with this medication.   - He would like to try Clomid at this time  2. BPH with LUTS  - IPSS score is 24/4, it is worsening  - Continue conservative management, avoiding bladder irritants and timed voiding's  - Continue tamsulosin 0.4 mg daily and finasteride 5 mg daily  - not interested in a bladder outlet procedure at this time  - RTC in 6 months for IPSS, PSA and exam   3. Erectile dysfunction:  SHIM score is 5.   I explained to the patient that in order to achieve an erection it takes good functioning of the nervous system (parasympathetic, sympathetic, sensory and motor), good blood flow into the erectile tissue of the penis and a desire to have sex.   I stated that conditions like diabetes, hypertension, coronary artery disease, peripheral vascular disease, smoking, alcohol consumption, age and BPH can diminish the ability to have an erection.     - will address further once patient is testosterone is at therapeutic levels for three months  Return in about 1 month (around 12/08/2015) for serum testosterone in the morning before 10AM.  These notes generated with voice recognition software. I apologize for  typographical errors.  Zara Council, [facility] [address], [place], [place] [address] [phone]

## 2015-11-15 ENCOUNTER — Other Ambulatory Visit: Payer: Self-pay | Admitting: Family Medicine

## 2015-11-16 ENCOUNTER — Ambulatory Visit (INDEPENDENT_AMBULATORY_CARE_PROVIDER_SITE_OTHER): Payer: Medicare Other | Admitting: Family Medicine

## 2015-11-16 ENCOUNTER — Encounter: Payer: Self-pay | Admitting: Family Medicine

## 2015-11-16 VITALS — BP 110/68 | HR 63 | Temp 97.9°F | Wt 207.0 lb

## 2015-11-16 DIAGNOSIS — Z23 Encounter for immunization: Secondary | ICD-10-CM

## 2015-11-16 DIAGNOSIS — I4891 Unspecified atrial fibrillation: Secondary | ICD-10-CM

## 2015-11-16 DIAGNOSIS — Z7901 Long term (current) use of anticoagulants: Secondary | ICD-10-CM | POA: Diagnosis not present

## 2015-11-16 LAB — COAGUCHEK XS/INR WAIVED
INR: 2.4 — ABNORMAL HIGH (ref 0.9–1.1)
Prothrombin Time: 28.6 s

## 2015-11-16 NOTE — Assessment & Plan Note (Signed)
With anemia will check CBC next office visit

## 2015-11-16 NOTE — Assessment & Plan Note (Signed)
The current medical regimen is effective;  continue present plan and medications.  

## 2015-11-16 NOTE — Progress Notes (Signed)
   There were no vitals taken for this visit.   Subjective:    Patient ID: Sean Sheesley., male    DOB: Feb 11, 1941, 75 y.o.   MRN: [id number]  HPI: Sean Bison. is a 75 y.o. male    Relevant past medical, surgical, family and social history reviewed and updated as indicated. Interim medical history since our last visit reviewed. Allergies and medications reviewed and updated.  Review of Systems  Per HPI unless specifically indicated above     Objective:    There were no vitals taken for this visit.  Wt Readings from Last 3 Encounters:  11/08/15 206 lb 1.6 oz (93.5 kg)  10/21/15 205 lb (93 kg)  10/07/15 208 lb 12.8 oz (94.7 kg)    Physical Exam  Results for orders placed or performed in visit on 10/21/15  CoaguChek XS/INR Waived  Result Value Ref Range   INR 2.5 (H) 0.9 - 1.1   Prothrombin Time 30.6 sec      Assessment & Plan:   Problem List Items Addressed This Visit      Cardiovascular and Mediastinum   Atrial fibrillation ([place]) - Primary    Other Visit Diagnoses    Immunization due           Follow up plan: No Follow-up on file.

## 2015-11-16 NOTE — Progress Notes (Signed)
   BP 110/68 (BP Location: Left Arm, Patient Position: Sitting, Cuff Size: Normal)   Pulse 63   Temp 97.9 F (36.6 C)   Wt 207 lb (93.9 kg) Comment: with shoes  SpO2 99%   BMI 27.31 kg/m    Subjective:    Patient ID: Sean Reyes., male    DOB: 1940-09-24, 75 y.o.   MRN: [id number]  HPI: [name]. is a 75 y.o. male  Chief Complaint  Patient presents with  . Atrial Fibrillation  . CPAP paperwork   Patient using CPAP machine which is worn out and gotten NEEDS new CPAP paperwork filled out Atrial fibrillation stable on warfarin INR 2.4 today no bleeding bruising issues Reviewed colonoscopy patient had had CBC since colonoscopy which was totally normal   Relevant past medical, surgical, family and social history reviewed and updated as indicated. Interim medical history since our last visit reviewed. Allergies and medications reviewed and updated.  Review of Systems  Constitutional: Negative.   Respiratory: Negative.   Cardiovascular: Negative.     Per HPI unless specifically indicated above     Objective:    BP 110/68 (BP Location: Left Arm, Patient Position: Sitting, Cuff Size: Normal)   Pulse 63   Temp 97.9 F (36.6 C)   Wt 207 lb (93.9 kg) Comment: with shoes  SpO2 99%   BMI 27.31 kg/m   Wt Readings from Last 3 Encounters:  11/16/15 207 lb (93.9 kg)  11/08/15 206 lb 1.6 oz (93.5 kg)  10/21/15 205 lb (93 kg)    Physical Exam  Constitutional: He is oriented to person, place, and time. He appears well-developed and well-nourished. No distress.  HENT:  Head: Normocephalic and atraumatic.  Right Ear: Hearing normal.  Left Ear: Hearing normal.  Nose: Nose normal.  Eyes: Conjunctivae and lids are normal. Right eye exhibits no discharge. Left eye exhibits no discharge. No scleral icterus.  Cardiovascular: Normal heart sounds.   Pulmonary/Chest: Effort normal and breath sounds normal. No respiratory distress.  Musculoskeletal: Normal range of motion.    Neurological: He is alert and oriented to person, place, and time.  Skin: Skin is intact. No rash noted.  Psychiatric: He has a normal mood and affect. His speech is normal and behavior is normal. Judgment and thought content normal. Cognition and memory are normal.    Results for orders placed or performed in visit on 11/16/15  HM DIABETES EYE EXAM  Result Value Ref Range   HM Diabetic Eye Exam No Retinopathy No Retinopathy      Assessment & Plan:   Problem List Items Addressed This Visit      Cardiovascular and Mediastinum   Atrial fibrillation ([place]) - Primary    The current medical regimen is effective;  continue present plan and medications.       Relevant Orders   CoaguChek XS/INR Waived     Other   Chronic anticoagulation    With anemia will check CBC next office visit       Other Visit Diagnoses    Immunization due       Relevant Orders   Flu vaccine HIGH DOSE PF (Fluzone High dose) (Completed)       Follow up plan: Return in about 4 weeks (around 12/14/2015) for CBC, , PT INR.

## 2015-11-16 NOTE — Patient Instructions (Addendum)

## 2015-11-18 ENCOUNTER — Ambulatory Visit: Payer: Medicare Other | Admitting: Family Medicine

## 2015-11-18 DIAGNOSIS — M47816 Spondylosis without myelopathy or radiculopathy, lumbar region: Secondary | ICD-10-CM | POA: Diagnosis not present

## 2015-11-18 DIAGNOSIS — M48061 Spinal stenosis, lumbar region without neurogenic claudication: Secondary | ICD-10-CM | POA: Diagnosis not present

## 2015-11-29 NOTE — Telephone Encounter (Signed)
Erroneous encounter

## 2015-11-30 ENCOUNTER — Encounter: Payer: Self-pay | Admitting: Family Medicine

## 2015-11-30 ENCOUNTER — Ambulatory Visit (INDEPENDENT_AMBULATORY_CARE_PROVIDER_SITE_OTHER): Payer: Medicare Other | Admitting: Family Medicine

## 2015-11-30 DIAGNOSIS — I4891 Unspecified atrial fibrillation: Secondary | ICD-10-CM | POA: Diagnosis not present

## 2015-11-30 DIAGNOSIS — G4733 Obstructive sleep apnea (adult) (pediatric): Secondary | ICD-10-CM | POA: Diagnosis not present

## 2015-11-30 NOTE — Assessment & Plan Note (Signed)
Patient stable on CPAP will need new machine filled out paperwork to get new machine patient using his machine faithfully.

## 2015-11-30 NOTE — Progress Notes (Signed)
BP 130/78   Pulse 62   Temp 98.1 F (36.7 C)   Wt 212 lb (96.2 kg)   SpO2 99%   BMI 27.97 kg/m    Subjective:    Patient ID: Sean Hacker., male    DOB: 1940/10/14, 75 y.o.   MRN: [id number]  HPI: Sean Wesoloski. is a 75 y.o. male  Chief Complaint  Patient presents with  . Sleep Apnea    needing documentation for a new CPAP machine.   Patient follow-up doing well with his CPAP has been using faithfully long-term. Patient CPAP machine with steady oxygen levels of help control his atrial fibrillation. Has not had machine replacement for years and years and his machine is showing some wear and tear with some poor performance as a result. Patient's card reader showing average of 5-1/2-6 hours a night or so and use 99% of the time. Reviewed patient's record has significant sleep apnea resolved on CPAP.  Atrial fibrillation is stable which has been aggravated by low oxygen levels and sleep apnea. His A. fib is been helped significantly by using his CPAP machine also.  Relevant past medical, surgical, family and social history reviewed and updated as indicated. Interim medical history since our last visit reviewed. Allergies and medications reviewed and updated.  Review of Systems  Constitutional: Negative.   Respiratory: Negative.   Cardiovascular: Negative.     Per HPI unless specifically indicated above     Objective:    BP 130/78   Pulse 62   Temp 98.1 F (36.7 C)   Wt 212 lb (96.2 kg)   SpO2 99%   BMI 27.97 kg/m   Wt Readings from Last 3 Encounters:  11/30/15 212 lb (96.2 kg)  11/16/15 207 lb (93.9 kg)  11/08/15 206 lb 1.6 oz (93.5 kg)    Physical Exam  Constitutional: He is oriented to person, place, and time. He appears well-developed and well-nourished. No distress.  HENT:  Head: Normocephalic and atraumatic.  Right Ear: Hearing normal.  Left Ear: Hearing normal.  Nose: Nose normal.  Eyes: Conjunctivae and lids are normal. Right eye exhibits no  discharge. Left eye exhibits no discharge. No scleral icterus.  Cardiovascular: Normal rate, regular rhythm and normal heart sounds.   Pulmonary/Chest: Effort normal and breath sounds normal. No respiratory distress.  Musculoskeletal: Normal range of motion.  Neurological: He is alert and oriented to person, place, and time.  Skin: Skin is intact. No rash noted.  Psychiatric: He has a normal mood and affect. His speech is normal and behavior is normal. Judgment and thought content normal. Cognition and memory are normal.    Results for orders placed or performed in visit on 11/17/15  HM DIABETES EYE EXAM  Result Value Ref Range   HM Diabetic Eye Exam No Retinopathy No Retinopathy      Assessment & Plan:   Problem List Items Addressed This Visit      Cardiovascular and Mediastinum   Atrial fibrillation ([place])    Atrial fibrillation assisted control with stable oxygenation by using his CPAP patient will continue to use his CPAP which is been using faithfully.        Respiratory   OSA (obstructive sleep apnea)    Patient stable on CPAP will need new machine filled out paperwork to get new machine patient using his machine faithfully.       Other Visit Diagnoses   None.      Follow up plan: Return for As scheduled.

## 2015-11-30 NOTE — Assessment & Plan Note (Signed)
Atrial fibrillation assisted control with stable oxygenation by using his CPAP patient will continue to use his CPAP which is been using faithfully.

## 2015-12-07 ENCOUNTER — Other Ambulatory Visit: Payer: Self-pay

## 2015-12-07 DIAGNOSIS — E291 Testicular hypofunction: Secondary | ICD-10-CM

## 2015-12-08 ENCOUNTER — Other Ambulatory Visit: Payer: Medicare Other

## 2015-12-08 DIAGNOSIS — E291 Testicular hypofunction: Secondary | ICD-10-CM | POA: Diagnosis not present

## 2015-12-09 ENCOUNTER — Telehealth: Payer: Self-pay

## 2015-12-09 DIAGNOSIS — N400 Enlarged prostate without lower urinary tract symptoms: Secondary | ICD-10-CM

## 2015-12-09 DIAGNOSIS — E291 Testicular hypofunction: Secondary | ICD-10-CM

## 2015-12-09 LAB — TESTOSTERONE: Testosterone: 653 ng/dL (ref 264–916)

## 2015-12-09 NOTE — Telephone Encounter (Signed)
-----   Message from Nori Riis, PA-C sent at 12/09/2015  8:28 AM EDT ----- Please notify the patient that his testosterone is normal.  I would like to see the patient in 6 months for an office visit.  Morning testosterone (before 10 AM), LFT's, HCT, PSA to be drawn before appointment

## 2015-12-09 NOTE — Telephone Encounter (Signed)
Spoke with pt in reference to lab results. Pt voiced understanding. Orders placed.

## 2015-12-20 ENCOUNTER — Ambulatory Visit (INDEPENDENT_AMBULATORY_CARE_PROVIDER_SITE_OTHER): Payer: Medicare Other | Admitting: Family Medicine

## 2015-12-20 ENCOUNTER — Encounter: Payer: Self-pay | Admitting: Family Medicine

## 2015-12-20 VITALS — BP 159/77 | HR 54 | Temp 98.4°F | Wt 212.0 lb

## 2015-12-20 DIAGNOSIS — I10 Essential (primary) hypertension: Secondary | ICD-10-CM | POA: Diagnosis not present

## 2015-12-20 DIAGNOSIS — I4891 Unspecified atrial fibrillation: Secondary | ICD-10-CM

## 2015-12-20 DIAGNOSIS — D649 Anemia, unspecified: Secondary | ICD-10-CM | POA: Diagnosis not present

## 2015-12-20 LAB — CBC WITH DIFFERENTIAL/PLATELET
Hematocrit: 38 % (ref 37.5–51.0)
Hemoglobin: 12.4 g/dL — ABNORMAL LOW (ref 12.6–17.7)
Lymphocytes Absolute: 2.8 10*3/uL (ref 0.7–3.1)
Lymphs: 36 %
MCH: 31.7 pg (ref 26.6–33.0)
MCHC: 32.6 g/dL (ref 31.5–35.7)
MCV: 97 fL (ref 79–97)
MID (Absolute): 1 10*3/uL (ref 0.1–1.6)
MID: 14 %
Neutrophils Absolute: 3.4 10*3/uL (ref 1.4–7.0)
Neutrophils: 50 %
Platelets: 138 10*3/uL — ABNORMAL LOW (ref 150–379)
RBC: 3.91 x10E6/uL — ABNORMAL LOW (ref 4.14–5.80)
RDW: 14.4 % (ref 12.3–15.4)
WBC: 6.9 10*3/uL (ref 3.4–10.8)

## 2015-12-20 LAB — COAGUCHEK XS/INR WAIVED
INR: 2.7 — ABNORMAL HIGH (ref 0.9–1.1)
Prothrombin Time: 32.7 s

## 2015-12-20 NOTE — Assessment & Plan Note (Signed)
Improved. CBC today WNL.

## 2015-12-20 NOTE — Patient Instructions (Signed)
Follow up in 1 month for repeat INR.  Send mychart message to me sometime later this week/early next week with home BP readings

## 2015-12-20 NOTE — Progress Notes (Signed)
BP (!) 164/75   Pulse (!) 58   Temp 98.4 F (36.9 C)   Wt 212 lb (96.2 kg)   SpO2 99%   BMI 27.97 kg/m    Subjective:    Patient ID: Sean Reyes., male    DOB: 1941-01-14, 75 y.o.   MRN: [id number]  HPI: Sean Traficante. is a 75 y.o. male  Chief Complaint  Patient presents with  . Anticoagulation    alternating between 4mg  and 4.5mg .   Pt presents today for 1 month INR follow up. Taking 4 mg and 4.5 mg coumadin alternating. No bleeding or bruising issues noted. Diet stable, no new medications reported.   Also wanted CBC recheck per Dr. Rance Muir request to follow up on his anemia. Has been feeling well lately, no fatigue or SOB noted.   Past Medical History:  Diagnosis Date  . Anxiety   . Arthritis   . Atrial fibrillation ([place])   . Depression   . Diabetes ([place])   . Hyperlipidemia   . Hypertension   . S/P ablation of atrial fibrillation    Ablative therapy  . Sleep apnea   . Tachycardia, unspecified    Social History   Social History  . Marital status: Married    Spouse name: N/A  . Number of children: N/A  . Years of education: N/A   Occupational History  . full time    Social History Main Topics  . Smoking status: Never Smoker  . Smokeless tobacco: Never Used  . Alcohol use No  . Drug use: No  . Sexual activity: Not on file   Other Topics Concern  . Not on file   Social History Narrative   Married   Gets regular exercise    Relevant past medical, surgical, family and social history reviewed and updated as indicated. Interim medical history since our last visit reviewed. Allergies and medications reviewed and updated.  Review of Systems  Constitutional: Negative.   HENT: Negative.   Eyes: Negative.   Respiratory: Negative.   Cardiovascular: Negative.   Gastrointestinal: Negative.   Genitourinary: Negative.   Musculoskeletal: Negative.   Skin: Negative.   Neurological: Negative.   Psychiatric/Behavioral: Negative.     Per HPI  unless specifically indicated above     Objective:    BP (!) 164/75   Pulse (!) 58   Temp 98.4 F (36.9 C)   Wt 212 lb (96.2 kg)   SpO2 99%   BMI 27.97 kg/m   Wt Readings from Last 3 Encounters:  12/20/15 212 lb (96.2 kg)  11/30/15 212 lb (96.2 kg)  11/16/15 207 lb (93.9 kg)    Physical Exam  Constitutional: He is oriented to person, place, and time. He appears well-developed and well-nourished. No distress.  HENT:  Head: Atraumatic.  Eyes: Conjunctivae are normal. Pupils are equal, round, and reactive to light. No scleral icterus.  Neck: Normal range of motion. Neck supple.  Cardiovascular: Normal rate.   Pulmonary/Chest: Effort normal and breath sounds normal. No respiratory distress.  Musculoskeletal: Normal range of motion.  Neurological: He is alert and oriented to person, place, and time.  Skin: Skin is warm and dry.  Psychiatric: He has a normal mood and affect. His behavior is normal.      Assessment & Plan:   Problem List Items Addressed This Visit      Cardiovascular and Mediastinum   Atrial fibrillation ([place]) - Primary    INR Stable at 2.7, continue current regimen of  alternating 4 mg and 4.5 mg      Relevant Orders   CoaguChek XS/INR Waived   Hypertension    Elevated today, checks closely with home monitor on regular basis. Will follow up via mychart at end of week with home readings to make sure BP has come back down. Usually WNL.         Other   Anemia    Improved. CBC today WNL.       Relevant Orders   CBC With Differential/Platelet    Other Visit Diagnoses    Erroneous encounter - disregard           Follow up plan: Return in about 4 weeks (around 01/17/2016) for INR.

## 2015-12-20 NOTE — Progress Notes (Signed)
This encounter was created in error - please disregard.

## 2015-12-20 NOTE — Assessment & Plan Note (Signed)
Elevated today, checks closely with home monitor on regular basis. Will follow up via mychart at end of week with home readings to make sure BP has come back down. Usually WNL.

## 2015-12-20 NOTE — Assessment & Plan Note (Signed)
INR Stable at 2.7, continue current regimen of alternating 4 mg and 4.5 mg

## 2015-12-31 DIAGNOSIS — S61019A Laceration without foreign body of unspecified thumb without damage to nail, initial encounter: Secondary | ICD-10-CM | POA: Diagnosis not present

## 2016-01-17 ENCOUNTER — Other Ambulatory Visit: Payer: Self-pay

## 2016-01-17 ENCOUNTER — Encounter: Payer: Self-pay | Admitting: Family Medicine

## 2016-01-17 ENCOUNTER — Ambulatory Visit (INDEPENDENT_AMBULATORY_CARE_PROVIDER_SITE_OTHER): Payer: Medicare Other | Admitting: Family Medicine

## 2016-01-17 VITALS — BP 114/72 | HR 58 | Temp 98.3°F | Wt 207.8 lb

## 2016-01-17 DIAGNOSIS — N401 Enlarged prostate with lower urinary tract symptoms: Secondary | ICD-10-CM

## 2016-01-17 DIAGNOSIS — I4891 Unspecified atrial fibrillation: Secondary | ICD-10-CM | POA: Diagnosis not present

## 2016-01-17 DIAGNOSIS — N138 Other obstructive and reflux uropathy: Secondary | ICD-10-CM

## 2016-01-17 DIAGNOSIS — I480 Paroxysmal atrial fibrillation: Secondary | ICD-10-CM

## 2016-01-17 LAB — COAGUCHEK XS/INR WAIVED
INR: 2.1 — ABNORMAL HIGH (ref 0.9–1.1)
Prothrombin Time: 25.8 s

## 2016-01-17 MED ORDER — WARFARIN SODIUM 4 MG PO TABS: 4.0000 mg | ORAL_TABLET | Freq: Once | ORAL | 0 refills | Status: DC

## 2016-01-17 NOTE — Assessment & Plan Note (Signed)
Continue current warfarin dose recheck INR in 1 month.

## 2016-01-17 NOTE — Assessment & Plan Note (Signed)
Discuss continuing finasteride and tamsulosin. Discuss double voiding and fluid restriction. Also discussed need for referral to urology. Patient wants to wait on referral for now.

## 2016-01-17 NOTE — Telephone Encounter (Signed)
Refill request for Warfarin 4mg  tablet.   Had an office Visit today 01/17/2016

## 2016-01-17 NOTE — Progress Notes (Signed)
BP 114/72 (BP Location: Left Arm, Patient Position: Sitting, Cuff Size: Large)   Pulse (!) 58   Temp 98.3 F (36.8 C)   Wt 207 lb 12.8 oz (94.3 kg)   SpO2 99%   BMI 27.42 kg/m    Subjective:    Patient ID: Sean Hacker., male    DOB: 1940-02-15, 75 y.o.   MRN: [id number]  HPI: Sean Bloemer. is a 75 y.o. male  Chief Complaint  Patient presents with  . Atrial Fibrillation    pt alternating 4 mg and 4.5 mg of warfarin   Goal 2-3 INR Doing well with warfarin no bleeding bruising issues taking faithfully without problems 4 mg alternating with 4.5 mg. Also concerned with some urinary retention especially nocturia at night with frequency has to get up 4-5 times at night. Taking tamsulosin and finasteride without problems Relevant past medical, surgical, family and social history reviewed and updated as indicated. Interim medical history since our last visit reviewed. Allergies and medications reviewed and updated.  Review of Systems  Constitutional: Negative.   Respiratory: Negative.   Cardiovascular: Negative.     Per HPI unless specifically indicated above     Objective:    BP 114/72 (BP Location: Left Arm, Patient Position: Sitting, Cuff Size: Large)   Pulse (!) 58   Temp 98.3 F (36.8 C)   Wt 207 lb 12.8 oz (94.3 kg)   SpO2 99%   BMI 27.42 kg/m   Wt Readings from Last 3 Encounters:  01/17/16 207 lb 12.8 oz (94.3 kg)  12/20/15 212 lb (96.2 kg)  11/30/15 212 lb (96.2 kg)    Physical Exam  Constitutional: He is oriented to person, place, and time. He appears well-developed and well-nourished. No distress.  HENT:  Head: Normocephalic and atraumatic.  Right Ear: Hearing normal.  Left Ear: Hearing normal.  Nose: Nose normal.  Eyes: Conjunctivae and lids are normal. Right eye exhibits no discharge. Left eye exhibits no discharge. No scleral icterus.  Cardiovascular: Normal heart sounds.   Pulmonary/Chest: Effort normal and breath sounds normal. No  respiratory distress.  Musculoskeletal: Normal range of motion.  Neurological: He is alert and oriented to person, place, and time.  Skin: Skin is intact. No rash noted.  Psychiatric: He has a normal mood and affect. His speech is normal and behavior is normal. Judgment and thought content normal. Cognition and memory are normal.    Results for orders placed or performed in visit on 12/20/15  CBC With Differential/Platelet  Result Value Ref Range   WBC 6.9 3.4 - 10.8 x10E3/uL   RBC 3.91 (L) 4.14 - 5.80 x10E6/uL   Hemoglobin 12.4 (L) 12.6 - 17.7 g/dL   Hematocrit 38.0 37.5 - 51.0 %   MCV 97 79 - 97 fL   MCH 31.7 26.6 - 33.0 pg   MCHC 32.6 31.5 - 35.7 g/dL   RDW 14.4 12.3 - 15.4 %   Platelets 138 (L) 150 - 379 x10E3/uL   Neutrophils 50 Not Estab. %   Lymphs 36 Not Estab. %   MID 14 Not Estab. %   Neutrophils Absolute 3.4 1.4 - 7.0 x10E3/uL   Lymphocytes Absolute 2.8 0.7 - 3.1 x10E3/uL   MID (Absolute) 1.0 0.1 - 1.6 X10E3/uL  CoaguChek XS/INR Waived  Result Value Ref Range   INR 2.7 (H) 0.9 - 1.1   Prothrombin Time 32.7 sec      Assessment & Plan:   Problem List Items Addressed This Visit  Cardiovascular and Mediastinum   Atrial fibrillation ([facility])    Continue current warfarin dose recheck INR in 1 month.      Relevant Orders   CoaguChek XS/INR Waived   Paroxysmal atrial fibrillation ([place]) - Primary   Relevant Orders   CoaguChek XS/INR Waived     Genitourinary   BPH with obstruction/lower urinary tract symptoms    Discuss continuing finasteride and tamsulosin. Discuss double voiding and fluid restriction. Also discussed need for referral to urology. Patient wants to wait on referral for now.          Follow up plan: Return in about 4 weeks (around 02/14/2016) for PT INR.

## 2016-01-18 DIAGNOSIS — I480 Paroxysmal atrial fibrillation: Secondary | ICD-10-CM | POA: Diagnosis not present

## 2016-01-18 DIAGNOSIS — E782 Mixed hyperlipidemia: Secondary | ICD-10-CM | POA: Diagnosis not present

## 2016-01-18 DIAGNOSIS — I10 Essential (primary) hypertension: Secondary | ICD-10-CM | POA: Diagnosis not present

## 2016-02-01 DIAGNOSIS — M6281 Muscle weakness (generalized): Secondary | ICD-10-CM | POA: Diagnosis not present

## 2016-02-01 DIAGNOSIS — M545 Low back pain: Secondary | ICD-10-CM | POA: Diagnosis not present

## 2016-02-01 DIAGNOSIS — G894 Chronic pain syndrome: Secondary | ICD-10-CM | POA: Diagnosis not present

## 2016-02-15 ENCOUNTER — Encounter: Payer: Self-pay | Admitting: Family Medicine

## 2016-02-15 ENCOUNTER — Ambulatory Visit (INDEPENDENT_AMBULATORY_CARE_PROVIDER_SITE_OTHER): Payer: Medicare Other | Admitting: Family Medicine

## 2016-02-15 VITALS — BP 151/80 | HR 61 | Temp 97.9°F | Wt 205.6 lb

## 2016-02-15 DIAGNOSIS — N183 Chronic kidney disease, stage 3 unspecified: Secondary | ICD-10-CM

## 2016-02-15 DIAGNOSIS — I480 Paroxysmal atrial fibrillation: Secondary | ICD-10-CM | POA: Diagnosis not present

## 2016-02-15 DIAGNOSIS — I4891 Unspecified atrial fibrillation: Secondary | ICD-10-CM

## 2016-02-15 DIAGNOSIS — I129 Hypertensive chronic kidney disease with stage 1 through stage 4 chronic kidney disease, or unspecified chronic kidney disease: Secondary | ICD-10-CM

## 2016-02-15 LAB — COAGUCHEK XS/INR WAIVED
INR: 2.5 — ABNORMAL HIGH (ref 0.9–1.1)
Prothrombin Time: 30.5 s

## 2016-02-15 MED ORDER — HYDROCHLOROTHIAZIDE 25 MG PO TABS: 25.0000 mg | ORAL_TABLET | Freq: Every day | ORAL | 1 refills | Status: DC

## 2016-02-15 NOTE — Progress Notes (Signed)
BP (!) 151/80   Pulse 61   Temp 97.9 F (36.6 C)   Wt 205 lb 9.6 oz (93.3 kg)   SpO2 98%   BMI 27.13 kg/m    Subjective:    Patient ID: Sean Reyes., male    DOB: 11/21/1940, 76 y.o.   MRN: [id number]  CC: Coumadin management  HPI: This patient is a 76 y.o. male who presents for coumadin management. The expected duration of coumadin treatment is lifelong The reason for anticoagulation is  A. Fib.  Present Coumadin dose: 4 mg alternating with 4.5 mg. Goal: 2.0-3.0  Excessive bruising: no Nose bleeding: no Rectal bleeding: no Prolonged menstrual cycles: N/A Eating diet with consistent amounts of foods containing Vitamin K:yes Any recent antibiotic use? no   HYPERTENSION Hypertension status: uncontrolled  Satisfied with current treatment? no Duration of hypertension: chronic BP monitoring frequency:  a few times a week BP range: 150s BP medication side effects:  no Medication compliance: excellent compliance Previous BP meds: benazepril, metoprolol, HCTZ 12.5 Aspirin: no Recurrent headaches: no Visual changes: no Palpitations: no Dyspnea: no Chest pain: no Lower extremity edema: no Dizzy/lightheaded: no    Relevant past medical, surgical, family and social history reviewed and updated as indicated. Interim medical history since our last visit reviewed. Allergies and medications reviewed and updated.  Review of Systems  Constitutional: Negative.   Respiratory: Negative.   Cardiovascular: Negative.   Psychiatric/Behavioral: Negative.        Objective:    BP (!) 151/80   Pulse 61   Temp 97.9 F (36.6 C)   Wt 205 lb 9.6 oz (93.3 kg)   SpO2 98%   BMI 27.13 kg/m   Wt Readings from Last 3 Encounters:  02/15/16 205 lb 9.6 oz (93.3 kg)  01/17/16 207 lb 12.8 oz (94.3 kg)  12/20/15 212 lb (96.2 kg)     Physical Exam  Constitutional: He is oriented to person, place, and time. He appears well-developed and well-nourished. No distress.  HENT:  Head:  Normocephalic and atraumatic.  Right Ear: Hearing normal.  Left Ear: Hearing normal.  Nose: Nose normal.  Eyes: Conjunctivae and lids are normal. Right eye exhibits no discharge. Left eye exhibits no discharge. No scleral icterus.  Cardiovascular: Normal rate, regular rhythm, normal heart sounds and intact distal pulses.  Exam reveals no gallop and no friction rub.   No murmur heard. Pulmonary/Chest: Effort normal and breath sounds normal. No respiratory distress. He has no wheezes. He has no rales. He exhibits no tenderness.  Musculoskeletal: Normal range of motion.  Neurological: He is alert and oriented to person, place, and time.  Skin: Skin is warm, dry and intact. No rash noted. He is not diaphoretic. No erythema. No pallor.  Psychiatric: He has a normal mood and affect. His speech is normal and behavior is normal. Judgment and thought content normal. Cognition and memory are normal.    Last INR: 2.5 Last PT:  30.5    Last CBC:  Lab Results  Component Value Date   WBC 6.9 12/20/2015   HGB 14.1 08/08/2014   HCT 38.0 12/20/2015   MCV 97 12/20/2015   PLT 138 (L) 12/20/2015    Results for orders placed or performed in visit on 01/17/16  CoaguChek XS/INR Waived  Result Value Ref Range   INR 2.1 (H) 0.9 - 1.1   Prothrombin Time 25.8 sec       Assessment/Plan:   Problem List Items Addressed This Visit  Cardiovascular and Mediastinum   Atrial fibrillation ([facility]) - Primary   Relevant Medications   hydrochlorothiazide (HYDRODIURIL) 25 MG tablet   Other Relevant Orders   CoaguChek XS/INR Waived   Paroxysmal atrial fibrillation ([place])    Discussed current plan face-to-face with patient. For coumadin dosing, elected to continue current dose. Will plan to recheck INR in 1 month.      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 Will increase his hctz  from 12.5mg  to 25mg  and recheck in 1 month.         Return in about 4 weeks (around 03/14/2016) for BP/DM/INR follow up.

## 2016-02-15 NOTE — Assessment & Plan Note (Signed)
Discussed current plan face-to-face with patient. For coumadin dosing, elected to continue current dose. Will plan to recheck INR in 1 month. 

## 2016-02-15 NOTE — Assessment & Plan Note (Signed)
Not under good control. Will increase his hctz from 12.5mg  to 25mg  and recheck in 1 month.

## 2016-02-16 DIAGNOSIS — G894 Chronic pain syndrome: Secondary | ICD-10-CM | POA: Diagnosis not present

## 2016-02-16 DIAGNOSIS — M6281 Muscle weakness (generalized): Secondary | ICD-10-CM | POA: Diagnosis not present

## 2016-02-16 DIAGNOSIS — M545 Low back pain: Secondary | ICD-10-CM | POA: Diagnosis not present

## 2016-02-22 DIAGNOSIS — G894 Chronic pain syndrome: Secondary | ICD-10-CM | POA: Diagnosis not present

## 2016-02-22 DIAGNOSIS — M545 Low back pain: Secondary | ICD-10-CM | POA: Diagnosis not present

## 2016-02-22 DIAGNOSIS — M6281 Muscle weakness (generalized): Secondary | ICD-10-CM | POA: Diagnosis not present

## 2016-02-25 DIAGNOSIS — M6281 Muscle weakness (generalized): Secondary | ICD-10-CM | POA: Diagnosis not present

## 2016-02-25 DIAGNOSIS — G894 Chronic pain syndrome: Secondary | ICD-10-CM | POA: Diagnosis not present

## 2016-02-25 DIAGNOSIS — M545 Low back pain: Secondary | ICD-10-CM | POA: Diagnosis not present

## 2016-03-03 DIAGNOSIS — M6281 Muscle weakness (generalized): Secondary | ICD-10-CM | POA: Diagnosis not present

## 2016-03-03 DIAGNOSIS — M545 Low back pain: Secondary | ICD-10-CM | POA: Diagnosis not present

## 2016-03-03 DIAGNOSIS — G894 Chronic pain syndrome: Secondary | ICD-10-CM | POA: Diagnosis not present

## 2016-03-08 DIAGNOSIS — M6281 Muscle weakness (generalized): Secondary | ICD-10-CM | POA: Diagnosis not present

## 2016-03-08 DIAGNOSIS — G894 Chronic pain syndrome: Secondary | ICD-10-CM | POA: Diagnosis not present

## 2016-03-08 DIAGNOSIS — M545 Low back pain: Secondary | ICD-10-CM | POA: Diagnosis not present

## 2016-03-13 ENCOUNTER — Other Ambulatory Visit: Payer: Self-pay | Admitting: Family Medicine

## 2016-03-14 ENCOUNTER — Ambulatory Visit (INDEPENDENT_AMBULATORY_CARE_PROVIDER_SITE_OTHER): Payer: Medicare Other | Admitting: Family Medicine

## 2016-03-14 ENCOUNTER — Encounter: Payer: Self-pay | Admitting: Family Medicine

## 2016-03-14 VITALS — BP 161/83 | HR 65 | Temp 97.8°F | Ht 73.0 in | Wt 210.0 lb

## 2016-03-14 DIAGNOSIS — I480 Paroxysmal atrial fibrillation: Secondary | ICD-10-CM

## 2016-03-14 DIAGNOSIS — N138 Other obstructive and reflux uropathy: Secondary | ICD-10-CM | POA: Diagnosis not present

## 2016-03-14 DIAGNOSIS — Z23 Encounter for immunization: Secondary | ICD-10-CM | POA: Diagnosis not present

## 2016-03-14 DIAGNOSIS — N401 Enlarged prostate with lower urinary tract symptoms: Secondary | ICD-10-CM | POA: Diagnosis not present

## 2016-03-14 DIAGNOSIS — Z131 Encounter for screening for diabetes mellitus: Secondary | ICD-10-CM | POA: Diagnosis not present

## 2016-03-14 DIAGNOSIS — I4891 Unspecified atrial fibrillation: Secondary | ICD-10-CM | POA: Diagnosis not present

## 2016-03-14 DIAGNOSIS — Z0000 Encounter for general adult medical examination without abnormal findings: Secondary | ICD-10-CM | POA: Diagnosis not present

## 2016-03-14 DIAGNOSIS — M5136 Other intervertebral disc degeneration, lumbar region: Secondary | ICD-10-CM

## 2016-03-14 DIAGNOSIS — I10 Essential (primary) hypertension: Secondary | ICD-10-CM

## 2016-03-14 DIAGNOSIS — E119 Type 2 diabetes mellitus without complications: Secondary | ICD-10-CM

## 2016-03-14 LAB — BAYER DCA HB A1C WAIVED: HB A1C (BAYER DCA - WAIVED): 7.4 % — ABNORMAL HIGH (ref <7.0)

## 2016-03-14 LAB — COAGUCHEK XS/INR WAIVED
INR: 2.6 — ABNORMAL HIGH (ref 0.9–1.1)
Prothrombin Time: 30.8 s

## 2016-03-14 MED ORDER — AMLODIPINE BESYLATE 5 MG PO TABS: 5.0000 mg | ORAL_TABLET | Freq: Every day | ORAL | 3 refills | Status: DC

## 2016-03-14 MED ORDER — WARFARIN SODIUM 4 MG PO TABS: 4.0000 mg | ORAL_TABLET | Freq: Once | ORAL | 1 refills | Status: DC

## 2016-03-14 MED ORDER — WARFARIN SODIUM 1 MG PO TABS: 0.5000 mg | ORAL_TABLET | ORAL | 1 refills | Status: DC

## 2016-03-14 MED ORDER — METFORMIN HCL ER (MOD) 1000 MG PO TB24: 1000.0000 mg | ORAL_TABLET | Freq: Two times a day (BID) | ORAL | 3 refills | Status: DC

## 2016-03-14 NOTE — Assessment & Plan Note (Signed)
Hypertension remaining poor control will add amlodipine 5 mg continue other current medications Check BMP next office visit in 1 month.

## 2016-03-14 NOTE — Assessment & Plan Note (Signed)
Discussed diabetes poor control patient will do better with diet nutrition exercise weight loss. In the meantime will increase metformin to 1000 twice a day. Check hemoglobin A1c again in 3 months.

## 2016-03-14 NOTE — Assessment & Plan Note (Signed)
INR stable on current warfarin dose will continue same dose recheck INR in 1 month.

## 2016-03-14 NOTE — Progress Notes (Signed)
BP (!) 161/83   Pulse 65   Temp 97.8 F (36.6 C) (Oral)   Ht 6\' 1"  (1.854 m)   Wt 210 lb (95.3 kg)   SpO2 98%   BMI 27.71 kg/m    Subjective:    Patient ID: Sean Hacker., male    DOB: 03-22-1940, 75 y.o.   MRN: [id number]  HPI: Sean Drotar. is a 76 y.o. male  Chief Complaint  Patient presents with  . Follow-up  . Coagulation Disorder  Patient doing well with warfarin no bleeding bruising issues atrial fib has remained stable taking other medications helping control his rhythm and doing well. Blood pressure on home monitoring his been elevated also elevated here patient has taken increased hydrochlorothiazide from 12.5 now taking 25 and still blood pressure has remained elevated. Has had some weight gain over the holiday. Patient's blood sugar taking metformin 500 twice a day without problems and again with weight gain and enjoying the holidays has had some dietary liberation. Relevant past medical, surgical, family and social history reviewed and updated as indicated. Interim medical history since our last visit reviewed. Allergies and medications reviewed and updated.  Review of Systems  Constitutional: Negative.   Respiratory: Negative.   Cardiovascular: Negative.     Per HPI unless specifically indicated above     Objective:    BP (!) 161/83   Pulse 65   Temp 97.8 F (36.6 C) (Oral)   Ht 6\' 1"  (1.854 m)   Wt 210 lb (95.3 kg)   SpO2 98%   BMI 27.71 kg/m   Wt Readings from Last 3 Encounters:  03/14/16 210 lb (95.3 kg)  02/15/16 205 lb 9.6 oz (93.3 kg)  01/17/16 207 lb 12.8 oz (94.3 kg)    Physical Exam  Constitutional: He is oriented to person, place, and time. He appears well-developed and well-nourished. No distress.  HENT:  Head: Normocephalic and atraumatic.  Right Ear: Hearing normal.  Left Ear: Hearing normal.  Nose: Nose normal.  Eyes: Conjunctivae and lids are normal. Right eye exhibits no discharge. Left eye exhibits no discharge. No  scleral icterus.  Cardiovascular: Normal rate, regular rhythm and normal heart sounds.   Pulmonary/Chest: Effort normal and breath sounds normal. No respiratory distress.  Musculoskeletal: Normal range of motion.  Neurological: He is alert and oriented to person, place, and time.  Skin: Skin is intact. No rash noted.  Psychiatric: He has a normal mood and affect. His speech is normal and behavior is normal. Judgment and thought content normal. Cognition and memory are normal.    Results for orders placed or performed in visit on 02/15/16  CoaguChek XS/INR Waived  Result Value Ref Range   INR 2.5 (H) 0.9 - 1.1   Prothrombin Time 30.5 sec      Assessment & Plan:   Problem List Items Addressed This Visit      Cardiovascular and Mediastinum   Atrial fibrillation ([facility]) - Primary   Relevant Medications   amLODipine (NORVASC) 5 MG tablet   warfarin (COUMADIN) 4 MG tablet   warfarin (COUMADIN) 1 MG tablet   Other Relevant Orders   CoaguChek XS/INR Waived (STAT)   Hypertension   Relevant Medications   amLODipine (NORVASC) 5 MG tablet   warfarin (COUMADIN) 4 MG tablet   warfarin (COUMADIN) 1 MG tablet   Paroxysmal atrial fibrillation ([facility])    INR stable on current warfarin dose will continue same dose recheck INR in 1 month.  Relevant Medications   amLODipine (NORVASC) 5 MG tablet   warfarin (COUMADIN) 4 MG tablet   warfarin (COUMADIN) 1 MG tablet   Benign essential HTN    Hypertension remaining poor control will add amlodipine 5 mg continue other current medications Check BMP next office visit in 1 month.      Relevant Medications   amLODipine (NORVASC) 5 MG tablet   warfarin (COUMADIN) 4 MG tablet   warfarin (COUMADIN) 1 MG tablet     Endocrine   Diabetes mellitus without complication ([place])    Discussed diabetes poor control patient will do better with diet nutrition exercise weight loss. In the meantime will increase metformin to 1000 twice a day. Check hemoglobin  A1c again in 3 months.      Relevant Medications   metFORMIN (GLUMETZA) 1000 MG (MOD) 24 hr tablet   Other Relevant Orders   Bayer DCA Hb A1c Waived     Musculoskeletal and Integument   Lumbar degenerative disc disease     Genitourinary   BPH with obstruction/lower urinary tract symptoms    Other Visit Diagnoses    Screening for diabetes mellitus (DM)       Relevant Orders   Bayer DCA Hb A1c Waived (STAT)   Health care maintenance       Immunization due       PE (physical exam), annual           Follow up plan: Return in about 4 weeks (around 04/11/2016) for BMP, PT INR BP check.

## 2016-04-03 ENCOUNTER — Telehealth: Payer: Self-pay | Admitting: Urology

## 2016-04-03 DIAGNOSIS — E291 Testicular hypofunction: Secondary | ICD-10-CM

## 2016-04-03 NOTE — Telephone Encounter (Signed)
Pt needs refill on Clomid.  He is out.  He said he was feeling better, but now, he isn't feeling that great.

## 2016-04-03 NOTE — Telephone Encounter (Signed)
It is okay to refill the Clomid but can I get further clarification on the comment " feeling better, but now, he isn't feeling that great".

## 2016-04-05 MED ORDER — CLOMIPHENE CITRATE 50 MG PO TABS: ORAL_TABLET | ORAL | 3 refills | Status: DC

## 2016-04-05 NOTE — Telephone Encounter (Signed)
Spoke with pt in reference to clomid and how he was feeling. Pt stated that he was feeling great while on the clomid. Pt stated that he was having energy and not so sleepy. Pt stated that he ran out of the medication about a month ago and wanted to see how he felt off of it and he noticed his energy levels going down and sleeping a lot more. Made pt aware new script was sent to pharmacy. Pt voiced understanding.

## 2016-04-12 ENCOUNTER — Ambulatory Visit (INDEPENDENT_AMBULATORY_CARE_PROVIDER_SITE_OTHER): Payer: Medicare Other | Admitting: Family Medicine

## 2016-04-12 ENCOUNTER — Encounter: Payer: Self-pay | Admitting: Family Medicine

## 2016-04-12 VITALS — BP 128/76 | HR 59 | Ht 73.0 in | Wt 201.0 lb

## 2016-04-12 DIAGNOSIS — I4891 Unspecified atrial fibrillation: Secondary | ICD-10-CM

## 2016-04-12 DIAGNOSIS — I480 Paroxysmal atrial fibrillation: Secondary | ICD-10-CM | POA: Diagnosis not present

## 2016-04-12 DIAGNOSIS — I10 Essential (primary) hypertension: Secondary | ICD-10-CM | POA: Diagnosis not present

## 2016-04-12 DIAGNOSIS — E782 Mixed hyperlipidemia: Secondary | ICD-10-CM

## 2016-04-12 LAB — COAGUCHEK XS/INR WAIVED
INR: 2.5 — ABNORMAL HIGH (ref 0.9–1.1)
Prothrombin Time: 29.6 s

## 2016-04-12 NOTE — Assessment & Plan Note (Signed)
Lifestyle controlled will continue off amlodipine continue good weight loss

## 2016-04-12 NOTE — Assessment & Plan Note (Signed)
The current medical regimen is effective;  continue present plan and medications.  

## 2016-04-12 NOTE — Progress Notes (Signed)
   BP 128/76   Pulse (!) 59   Ht 6\' 1"  (1.854 m)   Wt 201 lb (91.2 kg)   SpO2 99%   BMI 26.52 kg/m    Subjective:    Patient ID: Sean Reyes., male    DOB: [birth year], 76 y.o.   MRN: [id number]  HPI: Sean Reyes. is a 76 y.o. male  Chief Complaint  Patient presents with  . Follow-up  . Hypertension   Patient doing really well slight loss 9 pounds trying to eat better do better with diabetes hypertension. No complaints with medications stopped amlodipine because blood pressure was staying down without the medication. While taking it had no side effects. Diabetes doing well is no low blood sugar spells or issues or concerns. Doing well with warfarin no bleeding bruising issues. Relevant past medical, surgical, family and social history reviewed and updated as indicated. Interim medical history since our last visit reviewed. Allergies and medications reviewed and updated.  Review of Systems  Constitutional: Negative.   Respiratory: Negative.   Cardiovascular: Negative.     Per HPI unless specifically indicated above     Objective:    BP 128/76   Pulse (!) 59   Ht 6\' 1"  (1.854 m)   Wt 201 lb (91.2 kg)   SpO2 99%   BMI 26.52 kg/m   Wt Readings from Last 3 Encounters:  04/12/16 201 lb (91.2 kg)  03/14/16 210 lb (95.3 kg)  02/15/16 205 lb 9.6 oz (93.3 kg)    Physical Exam  Constitutional: He is oriented to person, place, and time. He appears well-developed and well-nourished.  HENT:  Head: Normocephalic and atraumatic.  Eyes: Conjunctivae and EOM are normal.  Neck: Normal range of motion.  Cardiovascular: Normal rate and normal heart sounds.   Pulmonary/Chest: Effort normal and breath sounds normal.  Musculoskeletal: Normal range of motion.  Neurological: He is alert and oriented to person, place, and time.  Skin: No erythema.  Psychiatric: He has a normal mood and affect. His behavior is normal. Judgment and thought content normal.    Results for orders  placed or performed in visit on 03/14/16  CoaguChek XS/INR Waived (STAT)  Result Value Ref Range   INR 2.6 (H) 0.9 - 1.1   Prothrombin Time 30.8 sec  Bayer DCA Hb A1c Waived (STAT)  Result Value Ref Range   Bayer DCA Hb A1c Waived 7.4 (H) <7.0 %      Assessment & Plan:   Problem List Items Addressed This Visit      Cardiovascular and Mediastinum   Atrial fibrillation ([place]) - Primary   Relevant Orders   CoaguChek XS/INR Waived   Hypertension    Lifestyle controlled will continue off amlodipine continue good weight loss      Relevant Orders   Basic metabolic panel   Paroxysmal atrial fibrillation (HCC)    The current medical regimen is effective;  continue present plan and medications.         Other   Mixed hyperlipidemia   Relevant Orders   Basic metabolic panel       Follow up plan: Return in about 4 weeks (around 05/10/2016) for PT INR in 1 mo and 2 mo a1c.

## 2016-04-13 ENCOUNTER — Encounter: Payer: Self-pay | Admitting: Family Medicine

## 2016-04-13 LAB — BASIC METABOLIC PANEL
BUN/Creatinine Ratio: 18 (ref 10–24)
BUN: 20 mg/dL (ref 8–27)
CO2: 26 mmol/L (ref 18–29)
Calcium: 9.4 mg/dL (ref 8.6–10.2)
Chloride: 96 mmol/L (ref 96–106)
Creatinine, Ser: 1.13 mg/dL (ref 0.76–1.27)
GFR calc Af Amer: 74 mL/min/{1.73_m2} (ref >59)
GFR calc non Af Amer: 64 mL/min/{1.73_m2} (ref >59)
Glucose: 251 mg/dL — ABNORMAL HIGH (ref 65–99)
Potassium: 4 mmol/L (ref 3.5–5.2)
Sodium: 137 mmol/L (ref 134–144)

## 2016-05-12 ENCOUNTER — Other Ambulatory Visit: Payer: Self-pay | Admitting: Family Medicine

## 2016-05-18 ENCOUNTER — Telehealth: Payer: Self-pay | Admitting: Family Medicine

## 2016-05-18 ENCOUNTER — Ambulatory Visit (INDEPENDENT_AMBULATORY_CARE_PROVIDER_SITE_OTHER): Payer: Medicare Other | Admitting: Family Medicine

## 2016-05-18 VITALS — BP 135/78 | HR 62 | Ht 73.0 in | Wt 203.8 lb

## 2016-05-18 DIAGNOSIS — N183 Chronic kidney disease, stage 3 unspecified: Secondary | ICD-10-CM

## 2016-05-18 DIAGNOSIS — Z7901 Long term (current) use of anticoagulants: Secondary | ICD-10-CM

## 2016-05-18 DIAGNOSIS — I129 Hypertensive chronic kidney disease with stage 1 through stage 4 chronic kidney disease, or unspecified chronic kidney disease: Secondary | ICD-10-CM | POA: Diagnosis not present

## 2016-05-18 DIAGNOSIS — I4891 Unspecified atrial fibrillation: Secondary | ICD-10-CM | POA: Diagnosis not present

## 2016-05-18 LAB — COAGUCHEK XS/INR WAIVED
INR: 2.7 — ABNORMAL HIGH (ref 0.9–1.1)
Prothrombin Time: 32.6 s

## 2016-05-18 NOTE — Progress Notes (Signed)
BP 135/78   Pulse 62   Ht 6\' 1"  (1.854 m)   Wt 203 lb 12.8 oz (92.4 kg)   SpO2 99%   BMI 26.89 kg/m    Subjective:    Patient ID: Sean Hacker., male    DOB: [birth year], 76 y.o.   MRN: [id number]  HPI: Sean Turnley. is a 76 y.o. male  Chief Complaint  Patient presents with  . Follow-up  Patient all in all doing really well had some stormy days with low blood pressure and cut out hydrochlorothiazide and Benzapril with continued good blood pressures and hasn't felt weak and low. Weight has stayed off and has done a good job with weight loss. No low blood sugar spells continues metformin. No bleeding bruising issues takes warfarin without problems  Relevant past medical, surgical, family and social history reviewed and updated as indicated. Interim medical history since our last visit reviewed. Allergies and medications reviewed and updated.  Review of Systems  Constitutional: Negative.   Respiratory: Negative.   Cardiovascular: Negative.     Per HPI unless specifically indicated above     Objective:    BP 135/78   Pulse 62   Ht 6\' 1"  (1.854 m)   Wt 203 lb 12.8 oz (92.4 kg)   SpO2 99%   BMI 26.89 kg/m   Wt Readings from Last 3 Encounters:  05/18/16 203 lb 12.8 oz (92.4 kg)  04/12/16 201 lb (91.2 kg)  03/14/16 210 lb (95.3 kg)    Physical Exam  Constitutional: He is oriented to person, place, and time. He appears well-developed and well-nourished.  HENT:  Head: Normocephalic and atraumatic.  Eyes: Conjunctivae and EOM are normal.  Neck: Normal range of motion.  Cardiovascular: Normal rate, regular rhythm and normal heart sounds.   Pulmonary/Chest: Effort normal and breath sounds normal.  Musculoskeletal: Normal range of motion.  Neurological: He is alert and oriented to person, place, and time.  Skin: No erythema.  Psychiatric: He has a normal mood and affect. His behavior is normal. Judgment and thought content normal.    Results for orders placed or  performed in visit on 17/49/44  Basic metabolic panel  Result Value Ref Range   Glucose 251 (H) 65 - 99 mg/dL   BUN 20 8 - 27 mg/dL   Creatinine, Ser 1.13 0.76 - 1.27 mg/dL   GFR calc non Af Amer 64 >59 mL/min/1.73   GFR calc Af Amer 74 >59 mL/min/1.73   BUN/Creatinine Ratio 18 10 - 24   Sodium 137 134 - 144 mmol/L   Potassium 4.0 3.5 - 5.2 mmol/L   Chloride 96 96 - 106 mmol/L   CO2 26 18 - 29 mmol/L   Calcium 9.4 8.6 - 10.2 mg/dL  CoaguChek XS/INR Waived  Result Value Ref Range   INR 2.5 (H) 0.9 - 1.1   Prothrombin Time 29.6 sec      Assessment & Plan:   Problem List Items Addressed This Visit      Cardiovascular and Mediastinum   Atrial fibrillation (HCC) - Primary   Relevant Medications   amLODipine (NORVASC) 5 MG tablet   Other Relevant Orders   CoaguChek XS/INR Waived     Genitourinary   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         Other   Chronic anticoagulation    The current medical regimen is effective;  continue  present plan and medications.           Follow up plan: Return in about 4 weeks (around 06/15/2016) for PT INR.

## 2016-05-18 NOTE — Assessment & Plan Note (Signed)
The current medical regimen is effective;  continue present plan and medications.  

## 2016-05-18 NOTE — Telephone Encounter (Signed)
Patient would like for Dr Jeananne Rama to add his A1c labs, and whatever he normally gets to his visit on 5/7.  Thanks

## 2016-06-05 ENCOUNTER — Other Ambulatory Visit: Payer: Medicare Other

## 2016-06-06 ENCOUNTER — Other Ambulatory Visit: Payer: Medicare Other

## 2016-06-06 DIAGNOSIS — E291 Testicular hypofunction: Secondary | ICD-10-CM | POA: Diagnosis not present

## 2016-06-06 DIAGNOSIS — N400 Enlarged prostate without lower urinary tract symptoms: Secondary | ICD-10-CM

## 2016-06-07 LAB — HEPATIC FUNCTION PANEL
ALT: 21 IU/L (ref 0–44)
AST: 22 IU/L (ref 0–40)
Albumin: 4 g/dL (ref 3.5–4.8)
Alkaline Phosphatase: 40 IU/L (ref 39–117)
Bilirubin Total: 0.4 mg/dL (ref 0.0–1.2)
Bilirubin, Direct: 0.12 mg/dL (ref 0.00–0.40)
Total Protein: 6.7 g/dL (ref 6.0–8.5)

## 2016-06-07 LAB — PSA: Prostate Specific Ag, Serum: 0.6 ng/mL (ref 0.0–4.0)

## 2016-06-07 LAB — TESTOSTERONE: Testosterone: 681 ng/dL (ref 264–916)

## 2016-06-07 LAB — HEMATOCRIT: Hematocrit: 38.1 % (ref 37.5–51.0)

## 2016-06-07 NOTE — Progress Notes (Signed)
06/08/2016 11:13 AM   Sean Reyes. Jul 02, 1940 [id number]  Referring provider: [name], MD [address] [place], [place] [address]  Chief Complaint  Patient presents with  . Hypogonadism    6 month follow up   . Benign Prostatic Hypertrophy    HPI: Patient is a 76 year old Caucasian male with testosterone deficiency, ED and BPH with LU TS who presents today for a 6 month follow up.  Testosterone deficiency Patient is experiencing a decrease in libido, a lack of energy, a decrease in strength,  erections being less strong, a recent deterioration in an ability to play sports, falling asleep after dinner and a recent deterioration in their work performance.  This is indicated by his responses to the ADAM questionnaire.   He is no longer having spontaneous erections at night.  He has sleep apnea and is sleeping with a CPAP machine.  His pretreatment testosterone level was 198 ng/dL on 09/01/2015 and 245 ng/dL on 09/23/2015.   He is taking Clomid 50 mg, 1/2 daily for his testosterone therapy.   His current testosterone is 681 ng/dL on 06/06/2016.  His hematocrit and liver functions are within normal limits.      Androgen Deficiency in the Aging Male    [place] Name 06/08/16 1000         Androgen Deficiency in the Aging Male   Do you have a decrease in libido (sex drive) Yes     Do you have lack of energy Yes     Do you have a decrease in strength and/or endurance Yes     Have you lost height No     Have you noticed a decreased "enjoyment of life" No     Are you sad and/or grumpy No     Are your erections less strong Yes     Have you noticed a recent deterioration in your ability to play sports Yes     Are you falling asleep after dinner Yes     Has there been a recent deterioration in your work performance Yes        Erectile dysfunction His SHIM score is 5, which is severe ED.  His previous SHIM score was 5.  He has been having difficulty with erections for several  years.   His major complaint is no erections.  His libido is diminished.   His risk factors for ED are age, BPH, hypogonadism, DM, HTN, HLD, sleep apnea, anxiety, depression, antidepressants, anticoagulants and blood pressure medications.   He denies any painful erections or curvatures with his erections.        SHIM    Row Name 06/08/16 1048         SHIM: Over the last 6 months:   How do you rate your confidence that you could get and keep an erection? Very Low     When you had erections with sexual stimulation, how often were your erections hard enough for penetration (entering your partner)? Almost Never or Never     During sexual intercourse, how often were you able to maintain your erection after you had penetrated (entered) your partner? Almost Never or Never     During sexual intercourse, how difficult was it to maintain your erection to completion of intercourse? Extremely Difficult     When you attempted sexual intercourse, how often was it satisfactory for you? Almost Never or Never       SHIM Total Score   SHIM  5        Score: 1-7 Severe ED 8-11 Moderate ED 12-16 Mild-Moderate ED 17-21 Mild ED 22-25 No ED  BPH WITH LUTS His IPSS score today is 20, which is severe lower urinary tract symptomatology.  He is mixed with his quality life due to his urinary symptoms.  His PVR is 16 mL.  His previous IPSS score was 24/4.  [name]  His major complaint today frequency, urgency and nocturia.  He has had these symptoms for several years.  He denies any dysuria, hematuria or suprapubic pain.  He currently taking finasteride 5 mg daily.  He discontinued the tamsulosin due to soft stools.  He also denies any recent fevers, chills, nausea or vomiting.   He does not have a family history of PCa.      IPSS    Row Name 06/08/16 1000         International Prostate Symptom Score   How often have you had the sensation of not emptying your bladder? Less than half the time     How often have you  had to urinate less than every two hours? About half the time     How often have you found you stopped and started again several times when you urinated? About half the time     How often have you found it difficult to postpone urination? About half the time     How often have you had a weak urinary stream? About half the time     How often have you had to strain to start urination? Less than 1 in 5 times     How many times did you typically get up at night to urinate? 5 Times     Total IPSS Score 20       Quality of Life due to urinary symptoms   If you were to spend the rest of your life with your urinary condition just the way it is now how would you feel about that? Mixed        Score:  1-7 Mild 8-19 Moderate 20-35 Severe    PMH: Past Medical History:  Diagnosis Date  . Anxiety   . Arthritis   . Atrial fibrillation ([place])   . Depression   . Diabetes ([place])   . Hyperlipidemia   . Hypertension   . S/P ablation of atrial fibrillation    Ablative therapy  . Sleep apnea   . Tachycardia, unspecified     Surgical History: Past Surgical History:  Procedure Laterality Date  . ABLATION    . COLONOSCOPY WITH PROPOFOL N/A 10/05/2015   Procedure: COLONOSCOPY WITH PROPOFOL;  Surgeon: Lollie Sails, MD;  Location[facility] ENDOSCOPY;  Service: Endoscopy;  Laterality: N/A;  . HERNIA REPAIR    . JOINT REPLACEMENT Bilateral    hips  . TONSILLECTOMY      Home Medications:  Allergies as of 06/08/2016      Reactions   Shellfish Allergy Anaphylaxis   Amiodarone Other (See Comments)   Tremors and thyroid toxicity   Levaquin [levofloxacin In D5w]       Medication List       Accurate as of 06/08/16 11:13 AM. Always use your most recent med list.          amLODipine 5 MG tablet Commonly known as:  NORVASC   b complex vitamins tablet Take 1 tablet by mouth daily.   clomiPHENE 50 MG tablet Commonly known as:  CLOMID 1/2 tablet daily  finasteride 5 MG tablet Commonly  known as:  PROSCAR Take 1 tablet (5 mg total) by mouth daily.   metFORMIN 1000 MG (MOD) 24 hr tablet Commonly known as:  GLUCOPHAGE XR Take 1 tablet (1,000 mg total) by mouth 2 (two) times daily.   metoprolol succinate 25 MG 24 hr tablet Commonly known as:  TOPROL-XL Take 1 tablet (25 mg total) by mouth 2 (two) times daily.   propafenone 425 MG 12 hr capsule Commonly known as:  RYTHMOL SR Take 425 mg by mouth 2 (two) times daily.   tamsulosin 0.4 MG Caps capsule Commonly known as:  FLOMAX 1 Capsule daily ORAL   warfarin 4 MG tablet Commonly known as:  COUMADIN Take 1 tablet (4 mg total) by mouth one time only at 6 PM.   warfarin 1 MG tablet Commonly known as:  COUMADIN Take 0.5 tablets (0.5 mg total) by mouth every other day. take as directed       Allergies:  Allergies  Allergen Reactions  . Shellfish Allergy Anaphylaxis  . Amiodarone Other (See Comments)    Tremors and thyroid toxicity  . Levaquin [Levofloxacin In D5w]     Family History: Family History  Problem Relation Age of Onset  . Cancer Mother   . Kidney disease Neg Hx   . Prostate cancer Neg Hx   . Kidney cancer Neg Hx   . Bladder Cancer Neg Hx     Social History:  reports that he has never smoked. He has never used smokeless tobacco. He reports that he does not drink alcohol or use drugs.  ROS: UROLOGY Frequent Urination?: Yes Hard to postpone urination?: Yes Burning/pain with urination?: No Get up at night to urinate?: Yes Leakage of urine?: Yes Urine stream starts and stops?: No Trouble starting stream?: No Do you have to strain to urinate?: No Blood in urine?: No Urinary tract infection?: No Sexually transmitted disease?: No Injury to kidneys or bladder?: No Painful intercourse?: No Weak stream?: Yes Erection problems?: Yes Penile pain?: No  Gastrointestinal Nausea?: No Vomiting?: No Indigestion/heartburn?: No Diarrhea?: Yes Constipation?: No  Constitutional Fever: No Night  sweats?: No Weight loss?: No Fatigue?: No  Skin Skin rash/lesions?: No Itching?: No  Eyes Blurred vision?: No Double vision?: No  Ears/Nose/Throat Sore throat?: No Sinus problems?: No  Hematologic/Lymphatic Swollen glands?: No Easy bruising?: No  Cardiovascular Leg swelling?: No Chest pain?: No  Respiratory Cough?: No Shortness of breath?: No  Endocrine Excessive thirst?: No  Musculoskeletal Back pain?: Yes Joint pain?: No  Neurological Headaches?: No Dizziness?: No  Psychologic Depression?: No Anxiety?: No  Physical Exam: BP (!) 143/78   Pulse 64   Ht 5\' 11"  (1.803 m)   Wt 201 lb 11.2 oz (91.5 kg)   BMI 28.13 kg/m   Constitutional: Well nourished. Alert and oriented, No acute distress. HEENT: [place] AT, moist mucus membranes. Trachea midline, no masses. Cardiovascular: No clubbing, cyanosis, or edema. Respiratory: Normal respiratory effort, no increased work of breathing. GI: Abdomen is soft, non tender, non distended, no abdominal masses. Liver and spleen not palpable.  No hernias appreciated.  Stool sample for occult testing is not indicated.   GU: No CVA tenderness.  No bladder fullness or masses.  Patient with circumcised phallus. Lentigines noted.   Urethral meatus is patent.  No penile discharge. No penile lesions or rashes. Scrotum without lesions, cysts, rashes and/or edema.  Testicles are located scrotally bilaterally. No masses are appreciated in the testicles. Left and right epididymis are normal.  Rectal: Patient with  normal sphincter tone. Anus and perineum without scarring or rashes. No rectal masses are appreciated. Prostate is approximately 50 grams, no nodules are appreciated. Seminal vesicles are normal. Skin: No rashes, bruises or suspicious lesions. Lymph: No cervical or inguinal adenopathy. Neurologic: Grossly intact, no focal deficits, moving all 4 extremities. Psychiatric: Normal mood and affect.  Laboratory Data: Lab Results    Component Value Date   WBC 6.9 12/20/2015   HGB 14.1 08/08/2014   HCT 38.1 06/06/2016   MCV 97 12/20/2015   PLT 138 (L) 12/20/2015    Lab Results  Component Value Date   CREATININE 1.13 04/12/2016   PSA History  0.9 ng/mL on 06/02/2015  0.6 ng/mL on 06/06/2016   Lab Results  Component Value Date   TESTOSTERONE 681 06/06/2016     Lab Results  Component Value Date   TSH 4.120 06/02/2015       Component Value Date/Time   CHOL 160 06/02/2015 0955   HDL 43 06/02/2015 0955   CHOLHDL 3.7 06/02/2015 0955   LDLCALC 95 06/02/2015 0955    Lab Results  Component Value Date   AST 22 06/06/2016   Lab Results  Component Value Date   ALT 21 06/06/2016     Assessment & Plan:    1. Testosterone deficiency   -most recent testosterone level is 681 ng/dL on 06/06/2016  (goal 450-600 ng/dL)  -continue Clomid 50 mg, 1/2 tablet daily; refills given  -RTC in 6 months for HCT, Hbg, testosterone, PSA, LFT's, ADAM and exam  2. BPH with LUTS  - IPSS score is 20/3, it is improving  - Continue conservative management, avoiding bladder irritants and timed voiding's  - Most bothersome symptoms are nocturia  - Continue finasteride 5 mg daily  - RTC in 6 months for IPSS, PSA, PVR and exam, as testosterone therapy can cause prostate enlargement and worsen LUTS  3. Erectile dysfunction:     -SHIM score is 5  -wife is not interested  -RTC in 6 months for SHIM score and exam, as testosterone therapy can affect erections  4. Nocturia  - sleeping with a CPAP machine  - I explained to the patient that nocturia is often multi-factorial and difficult to treat.  Sleeping disorders, heart conditions, peripheral vascular disease, diabetes, an enlarged prostate for men, an urethral stricture causing bladder outlet obstruction and/or certain medications can contribute to nocturia.  - I have suggested that the patient avoid caffeine after noon and alcohol in the evening.  He or she may also  benefit from fluid restrictions after 6:00 in the evening and voiding just prior to bedtime.  -  I also informed the patient that a recent study noted that decreasing sodium intake to 2.3 grams daily, if they don't have issues with hyponatremia, can also reduce the number of nightly voids     Return in about 6 months (around 12/08/2016) for PSA, LFT's, HCT, testosterone (before 10 AM), ADAM, IPSS, SHIM and exam.  These notes generated with voice recognition software. I apologize for typographical errors.  Zara Council, [facility] [address], [place], [place] [address] [phone]

## 2016-06-08 ENCOUNTER — Encounter: Payer: Self-pay | Admitting: Urology

## 2016-06-08 ENCOUNTER — Ambulatory Visit (INDEPENDENT_AMBULATORY_CARE_PROVIDER_SITE_OTHER): Payer: Medicare Other | Admitting: Urology

## 2016-06-08 VITALS — BP 143/78 | HR 64 | Ht 71.0 in | Wt 201.7 lb

## 2016-06-08 DIAGNOSIS — N401 Enlarged prostate with lower urinary tract symptoms: Secondary | ICD-10-CM

## 2016-06-08 DIAGNOSIS — E291 Testicular hypofunction: Secondary | ICD-10-CM | POA: Diagnosis not present

## 2016-06-08 DIAGNOSIS — R351 Nocturia: Secondary | ICD-10-CM | POA: Diagnosis not present

## 2016-06-08 DIAGNOSIS — N138 Other obstructive and reflux uropathy: Secondary | ICD-10-CM

## 2016-06-08 DIAGNOSIS — N529 Male erectile dysfunction, unspecified: Secondary | ICD-10-CM

## 2016-06-08 DIAGNOSIS — E349 Endocrine disorder, unspecified: Secondary | ICD-10-CM | POA: Diagnosis not present

## 2016-06-08 LAB — BLADDER SCAN AMB NON-IMAGING: Scan Result: 16

## 2016-06-08 MED ORDER — CLOMIPHENE CITRATE 50 MG PO TABS: ORAL_TABLET | ORAL | 3 refills | Status: DC

## 2016-06-09 ENCOUNTER — Other Ambulatory Visit: Payer: Self-pay | Admitting: Family Medicine

## 2016-06-09 DIAGNOSIS — I4891 Unspecified atrial fibrillation: Secondary | ICD-10-CM

## 2016-06-09 NOTE — Telephone Encounter (Signed)
Last OV: 05/18/16 Next OV: 06/19/16   Lab Results  Component Value Date   INR 2.7 (H) 05/18/2016   INR 2.5 (H) 04/12/2016   INR 2.6 (H) 03/14/2016

## 2016-06-19 ENCOUNTER — Ambulatory Visit (INDEPENDENT_AMBULATORY_CARE_PROVIDER_SITE_OTHER): Payer: Medicare Other | Admitting: Family Medicine

## 2016-06-19 ENCOUNTER — Encounter: Payer: Self-pay | Admitting: Family Medicine

## 2016-06-19 ENCOUNTER — Ambulatory Visit: Payer: Medicare Other | Admitting: Family Medicine

## 2016-06-19 VITALS — BP 124/68 | HR 58 | Temp 97.9°F | Wt 199.0 lb

## 2016-06-19 DIAGNOSIS — E119 Type 2 diabetes mellitus without complications: Secondary | ICD-10-CM | POA: Diagnosis not present

## 2016-06-19 DIAGNOSIS — N183 Chronic kidney disease, stage 3 unspecified: Secondary | ICD-10-CM

## 2016-06-19 DIAGNOSIS — I4891 Unspecified atrial fibrillation: Secondary | ICD-10-CM

## 2016-06-19 DIAGNOSIS — I129 Hypertensive chronic kidney disease with stage 1 through stage 4 chronic kidney disease, or unspecified chronic kidney disease: Secondary | ICD-10-CM | POA: Diagnosis not present

## 2016-06-19 LAB — BAYER DCA HB A1C WAIVED: HB A1C (BAYER DCA - WAIVED): 6.3 % (ref <7.0)

## 2016-06-19 LAB — COAGUCHEK XS/INR WAIVED
INR: 3 — ABNORMAL HIGH (ref 0.9–1.1)
Prothrombin Time: 35.6 s

## 2016-06-19 MED ORDER — WARFARIN SODIUM 4 MG PO TABS: 4.0000 mg | ORAL_TABLET | Freq: Once | ORAL | 1 refills | Status: DC

## 2016-06-19 MED ORDER — WARFARIN SODIUM 1 MG PO TABS: 0.5000 mg | ORAL_TABLET | ORAL | 1 refills | Status: DC

## 2016-06-19 MED ORDER — METFORMIN HCL ER 500 MG PO TB24: 500.0000 mg | ORAL_TABLET | Freq: Every day | ORAL | 1 refills | Status: DC

## 2016-06-19 NOTE — Assessment & Plan Note (Addendum)
Under good control with an A1c of 6.3. Continue diet and exercise. Continue to monitor. Recheck 3 months.

## 2016-06-19 NOTE — Progress Notes (Deleted)
   There were no vitals taken for this visit.   Subjective:    Patient ID: Sean Lenker., male    DOB: [birth year], 76 y.o.   MRN: [id number]  CC: Coumadin management  HPI: This patient is a 76 y.o. male who presents for coumadin management. The expected duration of coumadin treatment is {Blank single:19197::"lifelong","3 months","6 months"} The reason for anticoagulation is  {Blank single:19197::"A. Fib","thombophlebitis","mechanical heart valve","non-mechanical heart valve","Factor V Leiden","DVT/PE"}.  Present Coumadin dose: Goal: {Blank single:19197::"2.0-3.0","2.5-3.5"} The patient does not have an active anticoagulation episode. Excessive bruising: {Blank single:19197::"yes","no"} Nose bleeding: {Blank single:19197::"yes","no"} Rectal bleeding: {Blank single:19197::"yes","no"} Prolonged menstrual cycles: {Blank single:19197::"yes","no","N/A"} Eating diet with consistent amounts of foods containing Vitamin K:{Blank single:19197::"yes","no"} Any recent antibiotic use? {Blank single:19197::"yes","no"}  Relevant past medical, surgical, family and social history reviewed and updated as indicated. Interim medical history since our last visit reviewed. Allergies and medications reviewed and updated.  ROS: Per HPI unless specifically indicated above     Objective:    There were no vitals taken for this visit.  Wt Readings from Last 3 Encounters:  06/08/16 201 lb 11.2 oz (91.5 kg)  05/18/16 203 lb 12.8 oz (92.4 kg)  04/12/16 201 lb (91.2 kg)     General: Well appearing, well nourished in no distress.  Normal mood and affect. Skin: No excessive bruising or rash  Last INR: Last PT:      Last CBC:  Lab Results  Component Value Date   WBC 6.9 12/20/2015   HGB 14.1 08/08/2014   HCT 38.1 06/06/2016   MCV 97 12/20/2015   PLT 138 (L) 12/20/2015    Results for orders placed or performed in visit on 06/08/16  BLADDER SCAN AMB NON-IMAGING  Result Value Ref Range   Scan Result  16        Assessment:     ICD-9-CM ICD-10-CM   1. Atrial fibrillation, unspecified type ([place]) 427.31 I48.91 CoaguChek XS/INR Waived    Plan:   Discussed current plan face-to-face with patient. For coumadin dosing, elected to {Blank single:19197::"continue current dose","change dose to","hold dose"}. Will plan to recheck INR in {Blank single:19197::"1 month","1 week","2 weeks"}.

## 2016-06-19 NOTE — Assessment & Plan Note (Signed)
Under good control today. Continue DASH diet and continue to monitor. Recheck 6 months.

## 2016-06-19 NOTE — Patient Instructions (Addendum)
DASH Eating Plan DASH stands for "Dietary Approaches to Stop Hypertension." The DASH eating plan is a healthy eating plan that has been shown to reduce high blood pressure (hypertension). It may also reduce your risk for type 2 diabetes, heart disease, and stroke. The DASH eating plan may also help with weight loss. What are tips for following this plan? General guidelines  Avoid eating more than 2,300 mg (milligrams) of salt (sodium) a day. If you have hypertension, you may need to reduce your sodium intake to 1,500 mg a day.  Limit alcohol intake to no more than 1 drink a day for nonpregnant women and 2 drinks a day for men. One drink equals 12 oz of beer, 5 oz of wine, or 1 oz of hard liquor.  Work with your health care provider to maintain a healthy body weight or to lose weight. Ask what an ideal weight is for you.  Get at least 30 minutes of exercise that causes your heart to beat faster (aerobic exercise) most days of the week. Activities may include walking, swimming, or biking.  Work with your health care provider or diet and nutrition specialist (dietitian) to adjust your eating plan to your individual calorie needs. Reading food labels  Check food labels for the amount of sodium per serving. Choose foods with less than 5 percent of the Daily Value of sodium. Generally, foods with less than 300 mg of sodium per serving fit into this eating plan.  To find whole grains, look for the word "whole" as the first word in the ingredient list. Shopping  Buy products labeled as "low-sodium" or "no salt added."  Buy fresh foods. Avoid canned foods and premade or frozen meals. Cooking  Avoid adding salt when cooking. Use salt-free seasonings or herbs instead of table salt or sea salt. Check with your health care provider or pharmacist before using salt substitutes.  Do not fry foods. Cook foods using healthy methods such as baking, boiling, grilling, and broiling instead.  Cook with  heart-healthy oils, such as olive, canola, soybean, or sunflower oil. Meal planning   Eat a balanced diet that includes: ? 5 or more servings of fruits and vegetables each day. At each meal, try to fill half of your plate with fruits and vegetables. ? Up to 6-8 servings of whole grains each day. ? Less than 6 oz of lean meat, poultry, or fish each day. A 3-oz serving of meat is about the same size as a deck of cards. One egg equals 1 oz. ? 2 servings of low-fat dairy each day. ? A serving of nuts, seeds, or beans 5 times each week. ? Heart-healthy fats. Healthy fats called Omega-3 fatty acids are found in foods such as flaxseeds and coldwater fish, like sardines, salmon, and mackerel.  Limit how much you eat of the following: ? Canned or prepackaged foods. ? Food that is high in trans fat, such as fried foods. ? Food that is high in saturated fat, such as fatty meat. ? Sweets, desserts, sugary drinks, and other foods with added sugar. ? Full-fat dairy products.  Do not salt foods before eating.  Try to eat at least 2 vegetarian meals each week.  Eat more home-cooked food and less restaurant, buffet, and fast food.  When eating at a restaurant, ask that your food be prepared with less salt or no salt, if possible. What foods are recommended? The items listed may not be a complete list. Talk with your dietitian about what   dietary choices are best for you. Grains Whole-grain or whole-wheat bread. Whole-grain or whole-wheat pasta. Brown rice. Oatmeal. Quinoa. Bulgur. Whole-grain and low-sodium cereals. Pita bread. Low-fat, low-sodium crackers. Whole-wheat flour tortillas. Vegetables Fresh or frozen vegetables (raw, steamed, roasted, or grilled). Low-sodium or reduced-sodium tomato and vegetable juice. Low-sodium or reduced-sodium tomato sauce and tomato paste. Low-sodium or reduced-sodium canned vegetables. Fruits All fresh, dried, or frozen fruit. Canned fruit in natural juice (without  added sugar). Meat and other protein foods Skinless chicken or turkey. Ground chicken or turkey. Pork with fat trimmed off. Fish and seafood. Egg whites. Dried beans, peas, or lentils. Unsalted nuts, nut butters, and seeds. Unsalted canned beans. Lean cuts of beef with fat trimmed off. Low-sodium, lean deli meat. Dairy Low-fat (1%) or fat-free (skim) milk. Fat-free, low-fat, or reduced-fat cheeses. Nonfat, low-sodium ricotta or cottage cheese. Low-fat or nonfat yogurt. Low-fat, low-sodium cheese. Fats and oils Soft margarine without trans fats. Vegetable oil. Low-fat, reduced-fat, or light mayonnaise and salad dressings (reduced-sodium). Canola, safflower, olive, soybean, and sunflower oils. Avocado. Seasoning and other foods Herbs. Spices. Seasoning mixes without salt. Unsalted popcorn and pretzels. Fat-free sweets. What foods are not recommended? The items listed may not be a complete list. Talk with your dietitian about what dietary choices are best for you. Grains Baked goods made with fat, such as croissants, muffins, or some breads. Dry pasta or rice meal packs. Vegetables Creamed or fried vegetables. Vegetables in a cheese sauce. Regular canned vegetables (not low-sodium or reduced-sodium). Regular canned tomato sauce and paste (not low-sodium or reduced-sodium). Regular tomato and vegetable juice (not low-sodium or reduced-sodium). Pickles. Olives. Fruits Canned fruit in a light or heavy syrup. Fried fruit. Fruit in cream or butter sauce. Meat and other protein foods Fatty cuts of meat. Ribs. Fried meat. Bacon. Sausage. Bologna and other processed lunch meats. Salami. Fatback. Hotdogs. Bratwurst. Salted nuts and seeds. Canned beans with added salt. Canned or smoked fish. Whole eggs or egg yolks. Chicken or turkey with skin. Dairy Whole or 2% milk, cream, and half-and-half. Whole or full-fat cream cheese. Whole-fat or sweetened yogurt. Full-fat cheese. Nondairy creamers. Whipped toppings.  Processed cheese and cheese spreads. Fats and oils Butter. Stick margarine. Lard. Shortening. Ghee. Bacon fat. Tropical oils, such as coconut, palm kernel, or palm oil. Seasoning and other foods Salted popcorn and pretzels. Onion salt, garlic salt, seasoned salt, table salt, and sea salt. Worcestershire sauce. Tartar sauce. Barbecue sauce. Teriyaki sauce. Soy sauce, including reduced-sodium. Steak sauce. Canned and packaged gravies. Fish sauce. Oyster sauce. Cocktail sauce. Horseradish that you find on the shelf. Ketchup. Mustard. Meat flavorings and tenderizers. Bouillon cubes. Hot sauce and Tabasco sauce. Premade or packaged marinades. Premade or packaged taco seasonings. Relishes. Regular salad dressings. Where to find more information:  National Heart, Lung, and Blood Institute: www.nhlbi.nih.gov  American Heart Association: www.heart.org Summary  The DASH eating plan is a healthy eating plan that has been shown to reduce high blood pressure (hypertension). It may also reduce your risk for type 2 diabetes, heart disease, and stroke.  With the DASH eating plan, you should limit salt (sodium) intake to 2,300 mg a day. If you have hypertension, you may need to reduce your sodium intake to 1,500 mg a day.  When on the DASH eating plan, aim to eat more fresh fruits and vegetables, whole grains, lean proteins, low-fat dairy, and heart-healthy fats.  Work with your health care provider or diet and nutrition specialist (dietitian) to adjust your eating plan to your individual   calorie needs. This information is not intended to replace advice given to you by your health care provider. Make sure you discuss any questions you have with your health care provider. Document Released: 01/19/2011 Document Revised: 01/24/2016 Document Reviewed: 01/24/2016 Elsevier Interactive Patient Education  2017 [employer]  

## 2016-06-19 NOTE — Assessment & Plan Note (Addendum)
Under good control with INR of 3.0. Continue current dose of warfarin and recheck INR in 1 month.

## 2016-06-19 NOTE — Progress Notes (Signed)
BP 124/68 (BP Location: Left Arm, Cuff Size: Normal)   Pulse (!) 58   Temp 97.9 F (36.6 C)   Wt 199 lb (90.3 kg)   SpO2 100%   BMI 27.75 kg/m    Subjective:    Patient ID: Sean Reyes., male    DOB: [birth year], 76 y.o.   MRN: [id number]  HPI: Sean Reyes. is a 76 y.o. male  Chief Complaint  Patient presents with  . Diabetes  . Anticoagulation  . Hypertension   DIABETES Hypoglycemic episodes:no Polydipsia/polyuria: no Visual disturbance: no Chest pain: no Paresthesias: no Glucose Monitoring: no Taking Insulin?: no Blood Pressure Monitoring: not checking Retinal Examination: Up to Date Foot Exam: Done today Diabetic Education: Completed Pneumovax: Up to Date Influenza: Up to Date Aspirin: no  HYPERTENSION Hypertension status: controlled  Satisfied with current treatment? no Duration of hypertension: chronic BP monitoring frequency:  a few times a week BP range: 110s-140s/70s-90s BP medication side effects:  no Medication compliance: excellent compliance Previous BP meds: metoprolol, rhythmol, amlodipine Aspirin: no Recurrent headaches: no Visual changes: no Palpitations: no Dyspnea: no Chest pain: no Lower extremity edema: no Dizzy/lightheaded: no  Coumadin Management.  The expected duration of coumadin treatment is lifelong The reason for anticoagulation is  A. Fib.  Present Coumadin dose: 4, 4.5 alternating Goal: 2.0-3.0  Excessive bruising: no Nose bleeding: no Rectal bleeding: no Prolonged menstrual cycles: N/A Eating diet with consistent amounts of foods containing Vitamin K:yes Any recent antibiotic use? no   Relevant past medical, surgical, family and social history reviewed and updated as indicated. Interim medical history since our last visit reviewed. Allergies and medications reviewed and updated.  Review of Systems  Constitutional: Negative.   Respiratory: Negative.   Cardiovascular: Negative.   Psychiatric/Behavioral:  Negative.     Per HPI unless specifically indicated above     Objective:    BP 124/68 (BP Location: Left Arm, Cuff Size: Normal)   Pulse (!) 58   Temp 97.9 F (36.6 C)   Wt 199 lb (90.3 kg)   SpO2 100%   BMI 27.75 kg/m   Wt Readings from Last 3 Encounters:  06/19/16 199 lb (90.3 kg)  06/08/16 201 lb 11.2 oz (91.5 kg)  05/18/16 203 lb 12.8 oz (92.4 kg)    Physical Exam  Constitutional: He is oriented to person, place, and time. He appears well-developed and well-nourished. No distress.  HENT:  Head: Normocephalic and atraumatic.  Right Ear: Hearing normal.  Left Ear: Hearing normal.  Nose: Nose normal.  Eyes: Conjunctivae and lids are normal. Right eye exhibits no discharge. Left eye exhibits no discharge. No scleral icterus.  Cardiovascular: Normal rate, regular rhythm, normal heart sounds and intact distal pulses.  Exam reveals no gallop and no friction rub.   No murmur heard. Pulmonary/Chest: Effort normal and breath sounds normal. No respiratory distress. He has no wheezes. He has no rales. He exhibits no tenderness.  Musculoskeletal: Normal range of motion.  Neurological: He is alert and oriented to person, place, and time.  Skin: Skin is warm, dry and intact. No rash noted. No erythema. No pallor.  Psychiatric: He has a normal mood and affect. His speech is normal and behavior is normal. Judgment and thought content normal. Cognition and memory are normal.  Nursing note and vitals reviewed.   Results for orders placed or performed in visit on 06/08/16  BLADDER SCAN AMB NON-IMAGING  Result Value Ref Range   Scan Result 16  Assessment & Plan:   Problem List Items Addressed This Visit      Cardiovascular and Mediastinum   Atrial fibrillation ([place]) - Primary    Under good control with INR of 3.0. Continue current dose of warfarin and recheck INR in 1 month.       Relevant Medications   warfarin (COUMADIN) 1 MG tablet   warfarin (COUMADIN) 4 MG tablet    Other Relevant Orders   CoaguChek XS/INR Waived     Endocrine   Diabetes mellitus without complication ([place])    Under good control with an A1c of 6.3. Continue diet and exercise. Continue to monitor. Recheck 3 months.       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 Continue DASH diet and continue to monitor. Recheck 6 months.           Follow up plan: Return in about 4 weeks (around 07/17/2016) for INR, 3 months physcial with MAC.

## 2016-07-11 DIAGNOSIS — G8929 Other chronic pain: Secondary | ICD-10-CM | POA: Diagnosis not present

## 2016-07-11 DIAGNOSIS — M5416 Radiculopathy, lumbar region: Secondary | ICD-10-CM | POA: Diagnosis not present

## 2016-07-15 ENCOUNTER — Other Ambulatory Visit: Payer: Self-pay | Admitting: Family Medicine

## 2016-07-15 DIAGNOSIS — I10 Essential (primary) hypertension: Secondary | ICD-10-CM

## 2016-07-15 DIAGNOSIS — N138 Other obstructive and reflux uropathy: Secondary | ICD-10-CM

## 2016-07-15 DIAGNOSIS — E119 Type 2 diabetes mellitus without complications: Secondary | ICD-10-CM

## 2016-07-15 DIAGNOSIS — Z23 Encounter for immunization: Secondary | ICD-10-CM

## 2016-07-15 DIAGNOSIS — I4891 Unspecified atrial fibrillation: Secondary | ICD-10-CM

## 2016-07-15 DIAGNOSIS — M5136 Other intervertebral disc degeneration, lumbar region: Secondary | ICD-10-CM

## 2016-07-15 DIAGNOSIS — N401 Enlarged prostate with lower urinary tract symptoms: Secondary | ICD-10-CM

## 2016-07-15 DIAGNOSIS — Z0000 Encounter for general adult medical examination without abnormal findings: Secondary | ICD-10-CM

## 2016-07-17 NOTE — Telephone Encounter (Signed)
  Last routine OV: 06/19/16 Next OV: 07/25/16

## 2016-07-18 ENCOUNTER — Other Ambulatory Visit: Payer: Self-pay | Admitting: Student

## 2016-07-18 DIAGNOSIS — M5416 Radiculopathy, lumbar region: Secondary | ICD-10-CM

## 2016-07-19 DIAGNOSIS — I952 Hypotension due to drugs: Secondary | ICD-10-CM | POA: Diagnosis not present

## 2016-07-19 DIAGNOSIS — G4733 Obstructive sleep apnea (adult) (pediatric): Secondary | ICD-10-CM | POA: Diagnosis not present

## 2016-07-19 DIAGNOSIS — I480 Paroxysmal atrial fibrillation: Secondary | ICD-10-CM | POA: Diagnosis not present

## 2016-07-19 DIAGNOSIS — I10 Essential (primary) hypertension: Secondary | ICD-10-CM | POA: Diagnosis not present

## 2016-07-19 DIAGNOSIS — E782 Mixed hyperlipidemia: Secondary | ICD-10-CM | POA: Diagnosis not present

## 2016-07-25 ENCOUNTER — Encounter: Payer: Self-pay | Admitting: Family Medicine

## 2016-07-25 ENCOUNTER — Ambulatory Visit (INDEPENDENT_AMBULATORY_CARE_PROVIDER_SITE_OTHER): Payer: Medicare Other | Admitting: Family Medicine

## 2016-07-25 VITALS — BP 135/78 | HR 64 | Wt 199.0 lb

## 2016-07-25 DIAGNOSIS — N183 Chronic kidney disease, stage 3 unspecified: Secondary | ICD-10-CM

## 2016-07-25 DIAGNOSIS — I480 Paroxysmal atrial fibrillation: Secondary | ICD-10-CM | POA: Diagnosis not present

## 2016-07-25 DIAGNOSIS — I4891 Unspecified atrial fibrillation: Secondary | ICD-10-CM | POA: Diagnosis not present

## 2016-07-25 DIAGNOSIS — I129 Hypertensive chronic kidney disease with stage 1 through stage 4 chronic kidney disease, or unspecified chronic kidney disease: Secondary | ICD-10-CM

## 2016-07-25 LAB — COAGUCHEK XS/INR WAIVED
INR: 1.3 — ABNORMAL HIGH (ref 0.9–1.1)
Prothrombin Time: 15.2 s

## 2016-07-25 NOTE — Assessment & Plan Note (Signed)
A. fib stable. Discussed alternatives for therapy patient currently not interested because of cost of medications. Reviewed therapeutic window and time in the window with warfarin being difficult for now. We will adjust warfarin fields that it slowed due to stopping amlodipine will increase to 4-1/2 every day instead of 4 alternating 4-1/2 recheck INR in 2 weeks.

## 2016-07-25 NOTE — Progress Notes (Signed)
   BP 135/78   Pulse 64   Wt 199 lb (90.3 kg)   SpO2 98%   BMI 27.75 kg/m    Subjective:    Patient ID: Sean Reyes., male    DOB: 11-19-1940, 76 y.o.   MRN: [id number]  HPI: Bren Steers. is a 76 y.o. male  Chief Complaint  Patient presents with  . Follow-up   Patient follow-up warfarin INR doing well no complaints of bleeding bruising issues. Has had some hypotension episodes radiologist stopped amlodipine blood pressures been stable with no further episodes. Also reviewed blood pressure issues were on hot days and patient's doing better with hydration etc.  Relevant past medical, surgical, family and social history reviewed and updated as indicated. Interim medical history since our last visit reviewed. Allergies and medications reviewed and updated.  Review of Systems  Constitutional: Negative.   Respiratory: Negative.   Cardiovascular: Negative.     Per HPI unless specifically indicated above     Objective:    BP 135/78   Pulse 64   Wt 199 lb (90.3 kg)   SpO2 98%   BMI 27.75 kg/m   Wt Readings from Last 3 Encounters:  07/25/16 199 lb (90.3 kg)  06/19/16 199 lb (90.3 kg)  06/08/16 201 lb 11.2 oz (91.5 kg)    Physical Exam  Constitutional: He is oriented to person, place, and time. He appears well-developed and well-nourished.  HENT:  Head: Normocephalic and atraumatic.  Eyes: Conjunctivae and EOM are normal.  Neck: Normal range of motion.  Cardiovascular: Normal rate, regular rhythm and normal heart sounds.   Pulmonary/Chest: Effort normal and breath sounds normal.  Musculoskeletal: Normal range of motion.  Neurological: He is alert and oriented to person, place, and time.  Skin: No erythema.  Psychiatric: He has a normal mood and affect. His behavior is normal. Judgment and thought content normal.    Results for orders placed or performed in visit on 06/19/16  CoaguChek XS/INR Waived  Result Value Ref Range   INR 3.0 (H) 0.9 - 1.1   Prothrombin Time 35.6 sec  Bayer DCA Hb A1c Waived  Result Value Ref Range   Bayer DCA Hb A1c Waived 6.3 <7.0 %      Assessment & Plan:   Problem List Items Addressed This Visit      Cardiovascular and Mediastinum   Atrial fibrillation ([place]) - Primary    A. fib stable. Discussed alternatives for therapy patient currently not interested because of cost of medications. Reviewed therapeutic window and time in the window with warfarin being difficult for now. We will adjust warfarin fields that it slowed due to stopping amlodipine will increase to 4-1/2 every day instead of 4 alternating 4-1/2 recheck INR in 2 weeks.      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 Also discussed importance of hydration.           Follow up plan: Return in about 2 weeks (around 08/08/2016) for PT INR.

## 2016-07-25 NOTE — Assessment & Plan Note (Signed)
Discuss hypertension and observing blood pressure readings for now staying off of amlodipine. Also discussed importance of hydration.

## 2016-07-26 ENCOUNTER — Ambulatory Visit (INDEPENDENT_AMBULATORY_CARE_PROVIDER_SITE_OTHER): Payer: Medicare Other

## 2016-07-26 ENCOUNTER — Ambulatory Visit
Admission: RE | Admit: 2016-07-26 | Discharge: 2016-07-26 | Disposition: A | Discharge status: Home / Self Care | Payer: Medicare Other | Source: Ambulatory Visit | Attending: Student | Admitting: Student

## 2016-07-26 ENCOUNTER — Ambulatory Visit: Payer: Medicare Other

## 2016-07-26 VITALS — BP 138/70 | HR 80 | Temp 98.0°F | Resp 16 | Ht 73.0 in | Wt 200.9 lb

## 2016-07-26 DIAGNOSIS — M7138 Other bursal cyst, other site: Secondary | ICD-10-CM | POA: Diagnosis not present

## 2016-07-26 DIAGNOSIS — M4686 Other specified inflammatory spondylopathies, lumbar region: Secondary | ICD-10-CM | POA: Insufficient documentation

## 2016-07-26 DIAGNOSIS — Z0000 Encounter for general adult medical examination without abnormal findings: Secondary | ICD-10-CM

## 2016-07-26 DIAGNOSIS — M5416 Radiculopathy, lumbar region: Secondary | ICD-10-CM | POA: Diagnosis not present

## 2016-07-26 DIAGNOSIS — M4687 Other specified inflammatory spondylopathies, lumbosacral region: Secondary | ICD-10-CM | POA: Insufficient documentation

## 2016-07-26 DIAGNOSIS — M48061 Spinal stenosis, lumbar region without neurogenic claudication: Secondary | ICD-10-CM | POA: Insufficient documentation

## 2016-07-26 DIAGNOSIS — M4807 Spinal stenosis, lumbosacral region: Secondary | ICD-10-CM | POA: Insufficient documentation

## 2016-07-26 NOTE — Patient Instructions (Addendum)
[name] , Thank you for taking time to come for your Medicare Wellness Visit. I appreciate your ongoing commitment to your health goals. Please review the following plan we discussed and let me know if I can assist you in the future.   Screening recommendations/referrals: Colonoscopy: completed 10/05/2015 Recommended yearly ophthalmology/optometry visit for glaucoma screening and checkup Recommended yearly dental visit for hygiene and checkup  Vaccinations: Influenza vaccine: up to date, due 11/2016 Pneumococcal vaccine: completed series Tdap vaccine: up to date  Shingles vaccine: up to date   Advanced directives: Please bring a copy of your living will and health care power of attorney at your convenience.   Conditions/risks identified: Recommend drinking at least 3-4 glasses of water a day.  Next appointment: Follow up on 07/31/2016 at [place] with [place]. Follow up in one year for our annual wellness exam.   Preventive Care 65 Years and Older, Male Preventive care refers to lifestyle choices and visits with your health care provider that can promote health and wellness. What does preventive care include?  A yearly physical exam. This is also called an annual well check.  Dental exams once or twice a year.  Routine eye exams. Ask your health care provider how often you should have your eyes checked.  Personal lifestyle choices, including:  Daily care of your teeth and gums.  Regular physical activity.  Eating a healthy diet.  Avoiding tobacco and drug use.  Limiting alcohol use.  Practicing safe sex.  Taking low doses of aspirin every day.  Taking vitamin and mineral supplements as recommended by your health care provider. What happens during an annual well check? The services and screenings done by your health care provider during your annual well check will depend on your age, overall health, lifestyle risk factors, and family history of disease. Counseling    Your health care provider may ask you questions about your:  Alcohol use.  Tobacco use.  Drug use.  Emotional well-being.  Home and relationship well-being.  Sexual activity.  Eating habits.  History of falls.  Memory and ability to understand (cognition).  Work and work Statistician. Screening  You may have the following tests or measurements:  Height, weight, and BMI.  Blood pressure.  Lipid and cholesterol levels. These may be checked every 5 years, or more frequently if you are over 8 years old.  Skin check.  Lung cancer screening. You may have this screening every year starting at age 53 if you have a 30-pack-year history of smoking and currently smoke or have quit within the past 15 years.  Fecal occult blood test (FOBT) of the stool. You may have this test every year starting at age 35.  Flexible sigmoidoscopy or colonoscopy. You may have a sigmoidoscopy every 5 years or a colonoscopy every 10 years starting at age 66.  Prostate cancer screening. Recommendations will vary depending on your family history and other risks.  Hepatitis C blood test.  Hepatitis B blood test.  Sexually transmitted disease (STD) testing.  Diabetes screening. This is done by checking your blood sugar (glucose) after you have not eaten for a while (fasting). You may have this done every 1-3 years.  Abdominal aortic aneurysm (AAA) screening. You may need this if you are a current or former smoker.  Osteoporosis. You may be screened starting at age 68 if you are at high risk. Talk with your health care provider about your test results, treatment options, and if necessary, the need for more tests. Vaccines  Your health care provider may recommend certain vaccines, such as:  Influenza vaccine. This is recommended every year.  Tetanus, diphtheria, and acellular pertussis (Tdap, Td) vaccine. You may need a Td booster every 10 years.  Zoster vaccine. You may need this after age  74.  Pneumococcal 13-valent conjugate (PCV13) vaccine. One dose is recommended after age 19.  Pneumococcal polysaccharide (PPSV23) vaccine. One dose is recommended after age 2. Talk to your health care provider about which screenings and vaccines you need and how often you need them. This information is not intended to replace advice given to you by your health care provider. Make sure you discuss any questions you have with your health care provider. Document Released: 02/26/2015 Document Revised: 10/20/2015 Document Reviewed: 12/01/2014 Elsevier Interactive Patient Education  2017 [employer] Prevention in the Home Falls can cause injuries. They can happen to people of all ages. There are many things you can do to make your home safe and to help prevent falls. What can I do on the outside of my home?  Regularly fix the edges of walkways and driveways and fix any cracks.  Remove anything that might make you trip as you walk through a door, such as a raised step or threshold.  Trim any bushes or trees on the path to your home.  Use bright outdoor lighting.  Clear any walking paths of anything that might make someone trip, such as rocks or tools.  Regularly check to see if handrails are loose or broken. Make sure that both sides of any steps have handrails.  Any raised decks and porches should have guardrails on the edges.  Have any leaves, snow, or ice cleared regularly.  Use sand or salt on walking paths during winter.  Clean up any spills in your garage right away. This includes oil or grease spills. What can I do in the bathroom?  Use night lights.  Install grab bars by the toilet and in the tub and shower. Do not use towel bars as grab bars.  Use non-skid mats or decals in the tub or shower.  If you need to sit down in the shower, use a plastic, non-slip stool.  Keep the floor dry. Clean up any water that spills on the floor as soon as it happens.  Remove  soap buildup in the tub or shower regularly.  Attach bath mats securely with double-sided non-slip rug tape.  Do not have throw rugs and other things on the floor that can make you trip. What can I do in the bedroom?  Use night lights.  Make sure that you have a light by your bed that is easy to reach.  Do not use any sheets or blankets that are too big for your bed. They should not hang down onto the floor.  Have a firm chair that has side arms. You can use this for support while you get dressed.  Do not have throw rugs and other things on the floor that can make you trip. What can I do in the kitchen?  Clean up any spills right away.  Avoid walking on wet floors.  Keep items that you use a lot in easy-to-reach places.  If you need to reach something above you, use a strong step stool that has a grab bar.  Keep electrical cords out of the way.  Do not use floor polish or wax that makes floors slippery. If you must use wax, use non-skid floor wax.  Do  not have throw rugs and other things on the floor that can make you trip. What can I do with my stairs?  Do not leave any items on the stairs.  Make sure that there are handrails on both sides of the stairs and use them. Fix handrails that are broken or loose. Make sure that handrails are as long as the stairways.  Check any carpeting to make sure that it is firmly attached to the stairs. Fix any carpet that is loose or worn.  Avoid having throw rugs at the top or bottom of the stairs. If you do have throw rugs, attach them to the floor with carpet tape.  Make sure that you have a light switch at the top of the stairs and the bottom of the stairs. If you do not have them, ask someone to add them for you. What else can I do to help prevent falls?  Wear shoes that:  Do not have high heels.  Have rubber bottoms.  Are comfortable and fit you well.  Are closed at the toe. Do not wear sandals.  If you use a  stepladder:  Make sure that it is fully opened. Do not climb a closed stepladder.  Make sure that both sides of the stepladder are locked into place.  Ask someone to hold it for you, if possible.  Clearly mark and make sure that you can see:  Any grab bars or handrails.  First and last steps.  Where the edge of each step is.  Use tools that help you move around (mobility aids) if they are needed. These include:  Canes.  Walkers.  Scooters.  Crutches.  Turn on the lights when you go into a dark area. Replace any light bulbs as soon as they burn out.  Set up your furniture so you have a clear path. Avoid moving your furniture around.  If any of your floors are uneven, fix them.  If there are any pets around you, be aware of where they are.  Review your medicines with your doctor. Some medicines can make you feel dizzy. This can increase your chance of falling. Ask your doctor what other things that you can do to help prevent falls. This information is not intended to replace advice given to you by your health care provider. Make sure you discuss any questions you have with your health care provider. Document Released: 11/26/2008 Document Revised: 07/08/2015 Document Reviewed: 03/06/2014 Elsevier Interactive Patient Education  2017 [employer].

## 2016-07-26 NOTE — Progress Notes (Signed)
Subjective:   Sean Reyes. is a 76 y.o. male who presents for Medicare Annual/Subsequent preventive examination.  Review of Systems:   Cardiac Risk Factors include: male gender;advanced age (>25men, >21 women);hypertension     Objective:    Vitals: BP 138/70 (BP Location: Left Arm, Patient Position: Sitting)   Pulse 80   Temp 98 F (36.7 C)   Resp 16   Ht 6\' 1"  (1.854 m)   Wt 200 lb 14.4 oz (91.1 kg)   BMI 26.51 kg/m   Body mass index is 26.51 kg/m.  Tobacco History  Smoking Status  . Never Smoker  Smokeless Tobacco  . Never Used     Counseling given: Not Answered   Past Medical History:  Diagnosis Date  . Anxiety   . Arthritis   . Atrial fibrillation ([place])   . Depression   . Diabetes ([place])   . Hyperlipidemia   . Hypertension   . S/P ablation of atrial fibrillation    Ablative therapy  . Sleep apnea   . Tachycardia, unspecified    Past Surgical History:  Procedure Laterality Date  . ABLATION    . COLONOSCOPY WITH PROPOFOL N/A 10/05/2015   Procedure: COLONOSCOPY WITH PROPOFOL;  Surgeon: Lollie Sails, MD;  Location[facility] ENDOSCOPY;  Service: Endoscopy;  Laterality: N/A;  . HERNIA REPAIR    . JOINT REPLACEMENT Bilateral    hips  . TONSILLECTOMY     Family History  Problem Relation Age of Onset  . Brain cancer Mother   . Kidney disease Neg Hx   . Prostate cancer Neg Hx   . Kidney cancer Neg Hx   . Bladder Cancer Neg Hx    History  Sexual Activity  . Sexual activity: Not on file    Outpatient Encounter Prescriptions as of 07/26/2016  Medication Sig  . b complex vitamins tablet Take 1 tablet by mouth daily.  . clomiPHENE (CLOMID) 50 MG tablet 1/2 tablet daily  . finasteride (PROSCAR) 5 MG tablet Take 1 tablet (5 mg total) by mouth daily.  . metFORMIN (GLUCOPHAGE-XR) 500 MG 24 hr tablet Take 2 tablets (1,000 mg total) by mouth 2 (two) times daily.  . metoprolol succinate (TOPROL-XL) 25 MG 24 hr tablet Take 1 tablet (25 mg total) by mouth 2  (two) times daily.  . propafenone (RYTHMOL SR) 425 MG 12 hr capsule Take 425 mg by mouth 2 (two) times daily.  . tamsulosin (FLOMAX) 0.4 MG CAPS capsule 1 Capsule daily ORAL  . warfarin (COUMADIN) 1 MG tablet Take 0.5 tablets (0.5 mg total) by mouth every other day. take as directed  . warfarin (COUMADIN) 4 MG tablet Take 1 tablet (4 mg total) by mouth one time only at 6 PM.   No facility-administered encounter medications on file as of 07/26/2016.     Activities of Daily Living In your present state of health, do you have any difficulty performing the following activities: 07/26/2016 06/19/2016  Hearing? N Y  Vision? N N  Difficulty concentrating or making decisions? N N  Walking or climbing stairs? N Y  Dressing or bathing? N N  Doing errands, shopping? N N  Preparing Food and eating ? N -  Using the Toilet? N -  In the past six months, have you accidently leaked urine? N -  Do you have problems with loss of bowel control? N -  Managing your Medications? N -  Managing your Finances? N -  Housekeeping or managing your Housekeeping? N -  Some recent data might be hidden    Patient Care Team: [name], MD as PCP - General (Family Medicine) [name], MD as PCP - Family Medicine (Family Medicine) Corey Skains, MD as Consulting Physician (Internal Medicine) Arta Bruce, MD as Referring Physician (Orthopedic Surgery) Ralene Bathe, MD (Dermatology) Leonie Green, MD as Referring Physician (Surgery)   Assessment:     Exercise Activities and Dietary recommendations Current Exercise Habits: The patient does not participate in regular exercise at present, Exercise limited by: orthopedic condition(s) (back pain )  Goals    . Increase water intake          Recommend drinking at least 3-4 glasses of water a day.      Fall Risk Fall Risk  07/26/2016 07/25/2016 06/19/2016 05/18/2016 03/14/2016  Falls in the past year? No No No No No   Depression  Screen PHQ 2/9 Scores 07/26/2016 06/19/2016 06/02/2015  PHQ - 2 Score 0 0 0    Cognitive Function     6CIT Screen 07/26/2016  What Year? 0 points  What month? 0 points  What time? 0 points  Count back from 20 0 points  Months in reverse 0 points  Repeat phrase 0 points  Total Score 0    Immunization History  Administered Date(s) Administered  . Influenza, High Dose Seasonal PF 11/16/2015  . Influenza,inj,Quad PF,36+ Mos 12/01/2014  . Pneumococcal Conjugate-13 09/03/2013  . Pneumococcal Polysaccharide-23 06/02/2015  . Td 06/02/2015  . Zoster 02/14/2011   Screening Tests Health Maintenance  Topic Date Due  . INFLUENZA VACCINE  09/13/2016  . OPHTHALMOLOGY EXAM  10/25/2016  . HEMOGLOBIN A1C  12/20/2016  . FOOT EXAM  06/19/2017  . TETANUS/TDAP  06/01/2025  . COLONOSCOPY  10/04/2025  . PNA vac Low Risk Adult  Completed      Plan:  I have personally reviewed and addressed the Medicare Annual Wellness questionnaire and have noted the following in the patient's chart:  A. Medical and social history B. Use of alcohol, tobacco or illicit drugs  C. Current medications and supplements D. Functional ability and status E.  Nutritional status F.  Physical activity G. Advance directives H. List of other physicians I.  Hospitalizations, surgeries, and ER visits in previous 12 months J.  [place] such as hearing and vision if needed, cognitive and depression L. Referrals and appointments   In addition, I have reviewed and discussed with patient certain preventive protocols, quality metrics, and best practice recommendations. A written personalized care plan for preventive services as well as general preventive health recommendations were provided to patient.   Signed,  Tyler Aas, LPN Nurse Health Advisor   MD Recommendations: intermittent diarrhea- has had some diet changes  I, as supervising physician, have reviewed the nurse health advisors Medicare wellness  visit note for this patient and concur with the findings and recommendations listed above. Golden Pop MD

## 2016-08-07 ENCOUNTER — Other Ambulatory Visit: Payer: Self-pay | Admitting: Neurosurgery

## 2016-08-07 DIAGNOSIS — M5126 Other intervertebral disc displacement, lumbar region: Secondary | ICD-10-CM | POA: Diagnosis not present

## 2016-08-10 ENCOUNTER — Ambulatory Visit (INDEPENDENT_AMBULATORY_CARE_PROVIDER_SITE_OTHER): Payer: Medicare Other | Admitting: Family Medicine

## 2016-08-10 ENCOUNTER — Encounter: Payer: Self-pay | Admitting: Family Medicine

## 2016-08-10 VITALS — BP 138/80 | HR 73 | Wt 201.0 lb

## 2016-08-10 DIAGNOSIS — M5136 Other intervertebral disc degeneration, lumbar region: Secondary | ICD-10-CM

## 2016-08-10 DIAGNOSIS — Z7901 Long term (current) use of anticoagulants: Secondary | ICD-10-CM

## 2016-08-10 DIAGNOSIS — I480 Paroxysmal atrial fibrillation: Secondary | ICD-10-CM

## 2016-08-10 NOTE — Assessment & Plan Note (Signed)
Pending surgery and discussed anticoagulation issues

## 2016-08-10 NOTE — Progress Notes (Signed)
   BP 138/80   Pulse 73   Wt 201 lb (91.2 kg)   SpO2 98%   BMI 26.52 kg/m    Subjective:    Patient ID: Sean Hacker., male    DOB: 11/26/1940, 76 y.o.   MRN: [id number]  HPI: Sean Maye. is a 76 y.o. male  Chief Complaint  Patient presents with  . Follow-up  . Atrial Fibrillation  . Back Pain    Aug. !st.    Patient with no issues with warfarin taken faithfully without problems no dietary issues or concerns no bleeding bruising issues or concerns. Back having problems as usual. No real differences. But does have surgery scheduled August 1  Relevant past medical, surgical, family and social history reviewed and updated as indicated. Interim medical history since our last visit reviewed. Allergies and medications reviewed and updated.  Review of Systems  Constitutional: Negative.   Respiratory: Negative.   Cardiovascular: Negative.     Per HPI unless specifically indicated above     Objective:    BP 138/80   Pulse 73   Wt 201 lb (91.2 kg)   SpO2 98%   BMI 26.52 kg/m   Wt Readings from Last 3 Encounters:  08/10/16 201 lb (91.2 kg)  07/26/16 200 lb 14.4 oz (91.1 kg)  07/25/16 199 lb (90.3 kg)    Physical Exam  Constitutional: He is oriented to person, place, and time. He appears well-developed and well-nourished.  HENT:  Head: Normocephalic and atraumatic.  Eyes: Conjunctivae and EOM are normal.  Neck: Normal range of motion.  Cardiovascular: Normal rate, regular rhythm and normal heart sounds.   Pulmonary/Chest: Effort normal and breath sounds normal.  Musculoskeletal: Normal range of motion.  Neurological: He is alert and oriented to person, place, and time.  Skin: No erythema.  Psychiatric: He has a normal mood and affect. His behavior is normal. Judgment and thought content normal.    Results for orders placed or performed in visit on 07/25/16  CoaguChek XS/INR Waived  Result Value Ref Range   INR 1.3 (H) 0.9 - 1.1   Prothrombin Time 15.2  sec      Assessment & Plan:   Problem List Items Addressed This Visit      Cardiovascular and Mediastinum   Paroxysmal atrial fibrillation (HCC)    Stable for now and stable on warfarin. Discuss transition off warfarin and possibility of Lovenox patient will review with cardiology if necessary.        Musculoskeletal and Integument   Lumbar degenerative disc disease    Pending surgery and discussed anticoagulation issues        Other   Chronic anticoagulation - Primary   Relevant Orders   INR/PT       Follow up plan: Return in about 4 weeks (around 09/07/2016) for PT INR.

## 2016-08-10 NOTE — Assessment & Plan Note (Signed)
Stable for now and stable on warfarin. Discuss transition off warfarin and possibility of Lovenox patient will review with cardiology if necessary.

## 2016-08-11 LAB — PROTIME-INR
INR: 2.8 — ABNORMAL HIGH (ref 0.8–1.2)
Prothrombin Time: 27.3 s — ABNORMAL HIGH (ref 9.1–12.0)

## 2016-08-14 ENCOUNTER — Encounter: Payer: Self-pay | Admitting: Family Medicine

## 2016-08-21 DIAGNOSIS — I480 Paroxysmal atrial fibrillation: Secondary | ICD-10-CM | POA: Diagnosis not present

## 2016-08-21 DIAGNOSIS — G4733 Obstructive sleep apnea (adult) (pediatric): Secondary | ICD-10-CM | POA: Diagnosis not present

## 2016-08-21 DIAGNOSIS — I10 Essential (primary) hypertension: Secondary | ICD-10-CM | POA: Diagnosis not present

## 2016-08-21 DIAGNOSIS — Z9889 Other specified postprocedural states: Secondary | ICD-10-CM | POA: Diagnosis not present

## 2016-08-21 DIAGNOSIS — I952 Hypotension due to drugs: Secondary | ICD-10-CM | POA: Diagnosis not present

## 2016-08-21 DIAGNOSIS — E782 Mixed hyperlipidemia: Secondary | ICD-10-CM | POA: Diagnosis not present

## 2016-08-21 DIAGNOSIS — R0609 Other forms of dyspnea: Secondary | ICD-10-CM | POA: Diagnosis not present

## 2016-08-22 ENCOUNTER — Telehealth: Payer: Self-pay | Admitting: Family Medicine

## 2016-08-22 NOTE — Telephone Encounter (Signed)
Called pt to schedule Annual Wellness Visit with [facility]  - knb  °

## 2016-08-23 ENCOUNTER — Encounter: Payer: Self-pay | Admitting: Family Medicine

## 2016-08-23 ENCOUNTER — Telehealth: Payer: Self-pay | Admitting: Family Medicine

## 2016-08-23 NOTE — Telephone Encounter (Signed)
Call pt 2.8 which is just fine. I thought that was sent to him.

## 2016-08-23 NOTE — Telephone Encounter (Signed)
Message relayed to patient. Verbalized understanding and denied questions.   

## 2016-08-23 NOTE — Telephone Encounter (Signed)
PLease advise

## 2016-09-05 DIAGNOSIS — R0609 Other forms of dyspnea: Secondary | ICD-10-CM | POA: Diagnosis not present

## 2016-09-05 DIAGNOSIS — I480 Paroxysmal atrial fibrillation: Secondary | ICD-10-CM | POA: Diagnosis not present

## 2016-09-05 DIAGNOSIS — E782 Mixed hyperlipidemia: Secondary | ICD-10-CM | POA: Diagnosis not present

## 2016-09-05 DIAGNOSIS — I10 Essential (primary) hypertension: Secondary | ICD-10-CM | POA: Diagnosis not present

## 2016-09-06 ENCOUNTER — Other Ambulatory Visit (HOSPITAL_COMMUNITY): Payer: Self-pay | Admitting: *Deleted

## 2016-09-06 NOTE — Pre-Procedure Instructions (Signed)
    Sean Bogacki Jr.  09/06/2016      [facility] - [place], [place] - [address] [place] [address] Phone: [phone] Fax: [phone]    Your procedure is scheduled on Wednesday, September 13, 2016 at 8:30 AM.   Report to [facility] Entrance "A" Admitting Office at 6:30 AM.   Call this number if you have problems the morning of surgery: [phone]   Questions prior to day of surgery, please call [phone] between 8 & 4 PM.   Remember:  Do not eat food or drink liquids after midnight Tuesday, 09/12/16.  Take these medicines the morning of surgery with A SIP OF WATER: Metoprolol (Toprol XL), Propafenone (Rythmol SR)  Stop Coumadin as instructed by cardiologist. Do not use Aspirin products or NSAIDS prior to surgery.   How to Manage Your Diabetes Before Surgery   Why is it important to control my blood sugar before and after surgery?   Improving blood sugar levels before and after surgery helps healing and can limit problems.  A way of improving blood sugar control is eating a healthy diet by:  - Eating less sugar and carbohydrates  - Increasing activity/exercise  - Talk with your doctor about reaching your blood sugar goals  High blood sugars (greater than 180 mg/dL) can raise your risk of infections and slow down your recovery so you will need to focus on controlling your diabetes during the weeks before surgery.  Make sure that the doctor who takes care of your diabetes knows about your planned surgery including the date and location.  How do I manage my blood sugars before surgery?   Check your blood sugar at least 4 times a day, 2 days before surgery to make sure that they are not too high or low.  Check your blood sugar the morning of your surgery when you wake up and every 2 hours until you get to the Short-Stay unit.  Treat a low blood sugar (less than 70 mg/dL) with 1/2 cup of clear juice (cranberry or apple), 4 glucose  tablets, OR glucose gel.  Recheck blood sugar in 15 minutes after treatment (to make sure it is greater than 70 mg/dL).  If blood sugar is not greater than 70 mg/dL on re-check, call [phone] for further instructions.   Report your blood sugar to the Short-Stay nurse when you get to Short-Stay.  References:  [employer] [facility], 2007 "How to Manage your Diabetes Before and After Surgery".  What do I do about my diabetes medications?   Do not take oral diabetes medicines (pills) the morning of surgery.   Do not wear jewelry.  Do not wear lotions, powders, cologne or deodorant.  Do not shave 48 hours prior to surgery.  Men may shave face and neck.  Do not bring valuables to the hospital.  [facility] is not responsible for any belongings or valuables.  Contacts, dentures or bridgework may not be worn into surgery.  Leave your suitcase in the car.  After surgery it may be brought to your room.  For patients admitted to the hospital, discharge time will be determined by your treatment team.  See "Preparing for Surgery" Instruction sheet.   Please read over the fact sheets that you were given.

## 2016-09-07 ENCOUNTER — Encounter (HOSPITAL_COMMUNITY): Payer: Self-pay

## 2016-09-07 ENCOUNTER — Encounter (HOSPITAL_COMMUNITY)
Admission: RE | Admit: 2016-09-07 | Discharge: 2016-09-07 | Disposition: A | Discharge status: Home / Self Care | Payer: Medicare Other | Source: Ambulatory Visit | Attending: Neurosurgery | Admitting: Neurosurgery

## 2016-09-07 DIAGNOSIS — I10 Essential (primary) hypertension: Secondary | ICD-10-CM | POA: Insufficient documentation

## 2016-09-07 DIAGNOSIS — G4733 Obstructive sleep apnea (adult) (pediatric): Secondary | ICD-10-CM | POA: Diagnosis not present

## 2016-09-07 DIAGNOSIS — Z01812 Encounter for preprocedural laboratory examination: Secondary | ICD-10-CM | POA: Diagnosis not present

## 2016-09-07 DIAGNOSIS — Z79899 Other long term (current) drug therapy: Secondary | ICD-10-CM | POA: Insufficient documentation

## 2016-09-07 DIAGNOSIS — E785 Hyperlipidemia, unspecified: Secondary | ICD-10-CM | POA: Diagnosis not present

## 2016-09-07 DIAGNOSIS — E119 Type 2 diabetes mellitus without complications: Secondary | ICD-10-CM | POA: Insufficient documentation

## 2016-09-07 DIAGNOSIS — R000 Tachycardia, unspecified: Secondary | ICD-10-CM | POA: Diagnosis not present

## 2016-09-07 HISTORY — DX: Other complications of anesthesia, initial encounter: T88.59XA

## 2016-09-07 HISTORY — DX: Adverse effect of unspecified anesthetic, initial encounter: T41.45XA

## 2016-09-07 LAB — BASIC METABOLIC PANEL
Anion gap: 9 (ref 5–15)
BUN: 17 mg/dL (ref 6–20)
CO2: 27 mmol/L (ref 22–32)
Calcium: 9.2 mg/dL (ref 8.9–10.3)
Chloride: 102 mmol/L (ref 101–111)
Creatinine, Ser: 1.2 mg/dL (ref 0.61–1.24)
GFR calc Af Amer: >60 mL/min (ref >60)
GFR calc non Af Amer: 58 mL/min — ABNORMAL LOW (ref >60)
Glucose, Bld: 210 mg/dL — ABNORMAL HIGH (ref 65–99)
Potassium: 4 mmol/L (ref 3.5–5.1)
Sodium: 138 mmol/L (ref 135–145)

## 2016-09-07 LAB — SURGICAL PCR SCREEN
MRSA, PCR: NEGATIVE
Staphylococcus aureus: POSITIVE — AB

## 2016-09-07 LAB — CBC
HCT: 38.1 % — ABNORMAL LOW (ref 39.0–52.0)
Hemoglobin: 12.8 g/dL — ABNORMAL LOW (ref 13.0–17.0)
MCH: 31.1 pg (ref 26.0–34.0)
MCHC: 33.6 g/dL (ref 30.0–36.0)
MCV: 92.7 fL (ref 78.0–100.0)
Platelets: 154 10*3/uL (ref 150–400)
RBC: 4.11 MIL/uL — ABNORMAL LOW (ref 4.22–5.81)
RDW: 14.1 % (ref 11.5–15.5)
WBC: 6.7 10*3/uL (ref 4.0–10.5)

## 2016-09-07 LAB — GLUCOSE, CAPILLARY: Glucose-Capillary: 207 mg/dL — ABNORMAL HIGH (ref 65–99)

## 2016-09-07 NOTE — Progress Notes (Addendum)
I called a prescription for Mupirocin ointment to [facility].

## 2016-09-08 LAB — HEMOGLOBIN A1C
Hgb A1c MFr Bld: 6.5 % — ABNORMAL HIGH (ref 4.8–5.6)
Mean Plasma Glucose: 140 mg/dL

## 2016-09-11 NOTE — Progress Notes (Signed)
Anesthesia Chart Review:  Pt is a 76 year old male scheduled for L2-3, L3-4 microdiscectomy on 09/13/2016 with Kary Kos, MD  - PCP is Golden Pop, MD - Cardiologist is [name], MD who cleared pt for surgery at last office visit 09/05/16 (notes in care everywhere)   PMH includes:  PAF (s/p ablation), HTN, tachycardia, DM, hyperlipidemia, OSA. Never smoker. BMI 27.  Anesthesia history: Patient reports hypotension post-op and difficulty awakening after anesthesia  Medications include: Metformin, metoprolol, Coumadin. Patient stopped Coumadin 5 days before surgery.  Preoperative labs reviewed.   - HbA1c 6.5, glucose 210 - PT/INR will be obtained DOS.   EKG 08/21/16: Sinus rhythm with first-degree AV block. Possible anterior infarct, age undetermined.  Nuclear stress test 09/05/16 (care everywhere):  - Normal Lexiscan infusion EKG. Normal myocardial perfusion without evidence of myocardial ischemia  Echo 09/05/16 (care everywhere):  1. Normal LV systolic function 2. Normal RV systolic function. 3. Mild MR, mild TR, mild PR. Trivial AR. 4. No valvular stenosis  If no changes, I anticipate pt can proceed with surgery as scheduled.   Willeen Cass, FNP-BC [facility] Stay [facility]/Anesthesiology Phone: [phone] 09/11/2016 12:36 PM

## 2016-09-13 ENCOUNTER — Encounter (HOSPITAL_COMMUNITY): Payer: Self-pay | Admitting: *Deleted

## 2016-09-13 ENCOUNTER — Observation Stay (HOSPITAL_COMMUNITY)
Admission: RE | Admit: 2016-09-13 | Discharge: 2016-09-14 | Disposition: A | Discharge status: Home / Self Care | Payer: Medicare Other | Source: Ambulatory Visit | Attending: Neurosurgery | Admitting: Neurosurgery

## 2016-09-13 ENCOUNTER — Inpatient Hospital Stay (HOSPITAL_COMMUNITY): Payer: Medicare Other

## 2016-09-13 ENCOUNTER — Encounter (HOSPITAL_COMMUNITY)
Admission: RE | Disposition: A | Discharge status: Home / Self Care | Payer: Self-pay | Source: Ambulatory Visit | Attending: Neurosurgery

## 2016-09-13 ENCOUNTER — Inpatient Hospital Stay (HOSPITAL_COMMUNITY): Payer: Medicare Other | Admitting: Emergency Medicine

## 2016-09-13 ENCOUNTER — Inpatient Hospital Stay (HOSPITAL_COMMUNITY): Payer: Medicare Other | Admitting: Anesthesiology

## 2016-09-13 DIAGNOSIS — Z7901 Long term (current) use of anticoagulants: Secondary | ICD-10-CM | POA: Insufficient documentation

## 2016-09-13 DIAGNOSIS — E119 Type 2 diabetes mellitus without complications: Secondary | ICD-10-CM | POA: Diagnosis not present

## 2016-09-13 DIAGNOSIS — F419 Anxiety disorder, unspecified: Secondary | ICD-10-CM | POA: Insufficient documentation

## 2016-09-13 DIAGNOSIS — E785 Hyperlipidemia, unspecified: Secondary | ICD-10-CM | POA: Diagnosis not present

## 2016-09-13 DIAGNOSIS — G4730 Sleep apnea, unspecified: Secondary | ICD-10-CM | POA: Diagnosis not present

## 2016-09-13 DIAGNOSIS — M4726 Other spondylosis with radiculopathy, lumbar region: Secondary | ICD-10-CM | POA: Diagnosis not present

## 2016-09-13 DIAGNOSIS — Z79899 Other long term (current) drug therapy: Secondary | ICD-10-CM | POA: Insufficient documentation

## 2016-09-13 DIAGNOSIS — M48061 Spinal stenosis, lumbar region without neurogenic claudication: Secondary | ICD-10-CM | POA: Diagnosis not present

## 2016-09-13 DIAGNOSIS — I10 Essential (primary) hypertension: Secondary | ICD-10-CM | POA: Insufficient documentation

## 2016-09-13 DIAGNOSIS — I4891 Unspecified atrial fibrillation: Secondary | ICD-10-CM | POA: Diagnosis not present

## 2016-09-13 DIAGNOSIS — Z7984 Long term (current) use of oral hypoglycemic drugs: Secondary | ICD-10-CM | POA: Diagnosis not present

## 2016-09-13 DIAGNOSIS — Z419 Encounter for procedure for purposes other than remedying health state, unspecified: Secondary | ICD-10-CM

## 2016-09-13 DIAGNOSIS — F329 Major depressive disorder, single episode, unspecified: Secondary | ICD-10-CM | POA: Insufficient documentation

## 2016-09-13 DIAGNOSIS — M48062 Spinal stenosis, lumbar region with neurogenic claudication: Secondary | ICD-10-CM | POA: Diagnosis present

## 2016-09-13 DIAGNOSIS — I129 Hypertensive chronic kidney disease with stage 1 through stage 4 chronic kidney disease, or unspecified chronic kidney disease: Secondary | ICD-10-CM | POA: Diagnosis not present

## 2016-09-13 DIAGNOSIS — N183 Chronic kidney disease, stage 3 (moderate): Secondary | ICD-10-CM | POA: Diagnosis not present

## 2016-09-13 DIAGNOSIS — M5136 Other intervertebral disc degeneration, lumbar region: Secondary | ICD-10-CM | POA: Diagnosis not present

## 2016-09-13 DIAGNOSIS — M5126 Other intervertebral disc displacement, lumbar region: Secondary | ICD-10-CM | POA: Diagnosis not present

## 2016-09-13 HISTORY — PX: LUMBAR LAMINECTOMY/DECOMPRESSION MICRODISCECTOMY: SHX5026

## 2016-09-13 LAB — GLUCOSE, CAPILLARY
Glucose-Capillary: 131 mg/dL — ABNORMAL HIGH (ref 65–99)
Glucose-Capillary: 150 mg/dL — ABNORMAL HIGH (ref 65–99)
Glucose-Capillary: 160 mg/dL — ABNORMAL HIGH (ref 65–99)
Glucose-Capillary: 229 mg/dL — ABNORMAL HIGH (ref 65–99)
Glucose-Capillary: 199 mg/dL — ABNORMAL HIGH (ref 65–99)

## 2016-09-13 LAB — PROTIME-INR
INR: 1.2
Prothrombin Time: 15.3 seconds — ABNORMAL HIGH (ref 11.4–15.2)

## 2016-09-13 SURGERY — LUMBAR LAMINECTOMY/DECOMPRESSION MICRODISCECTOMY 2 LEVELS
Anesthesia: General | Site: Back | Laterality: Left

## 2016-09-13 MED ORDER — PHENOL 1.4 % MT LIQD: 1.0000 | OROMUCOSAL | Status: DC | PRN

## 2016-09-13 MED ORDER — PHENYLEPHRINE HCL 10 MG/ML IJ SOLN
INTRAVENOUS | Status: DC | PRN
  Administered 2016-09-13: 15 ug/min via INTRAVENOUS

## 2016-09-13 MED ORDER — LIDOCAINE HCL (CARDIAC) 20 MG/ML IV SOLN
INTRAVENOUS | Status: DC | PRN
  Administered 2016-09-13: 100 mg via INTRATRACHEAL

## 2016-09-13 MED ORDER — CEFAZOLIN SODIUM-DEXTROSE 2-4 GM/100ML-% IV SOLN
2.0000 g | Freq: Three times a day (TID) | INTRAVENOUS | Status: AC
  Administered 2016-09-13 – 2016-09-14 (×2): 2 g via INTRAVENOUS
  Filled 2016-09-13 (×2): qty 100

## 2016-09-13 MED ORDER — TAMSULOSIN HCL 0.4 MG PO CAPS
0.4000 mg | ORAL_CAPSULE | Freq: Every day | ORAL | Status: DC
  Administered 2016-09-13: 0.4 mg via ORAL
  Filled 2016-09-13: qty 1

## 2016-09-13 MED ORDER — PROPOFOL 10 MG/ML IV BOLUS
INTRAVENOUS | Status: DC | PRN
  Administered 2016-09-13: 150 mg via INTRAVENOUS

## 2016-09-13 MED ORDER — SODIUM CHLORIDE 0.9% FLUSH
3.0000 mL | INTRAVENOUS | Status: DC | PRN
Start: 2016-09-13 — End: 2016-09-14

## 2016-09-13 MED ORDER — ALUM & MAG HYDROXIDE-SIMETH 200-200-20 MG/5ML PO SUSP: 30.0000 mL | Freq: Four times a day (QID) | ORAL | Status: DC | PRN

## 2016-09-13 MED ORDER — LIDOCAINE 2% (20 MG/ML) 5 ML SYRINGE
INTRAMUSCULAR | Status: AC
  Filled 2016-09-13: qty 20

## 2016-09-13 MED ORDER — PANTOPRAZOLE SODIUM 40 MG IV SOLR
40.0000 mg | Freq: Every day | INTRAVENOUS | Status: DC
  Administered 2016-09-13: 40 mg via INTRAVENOUS
  Filled 2016-09-13: qty 40

## 2016-09-13 MED ORDER — PHENYLEPHRINE HCL 10 MG/ML IJ SOLN
INTRAMUSCULAR | Status: DC | PRN
  Administered 2016-09-13: 60 ug via INTRAVENOUS

## 2016-09-13 MED ORDER — OXYCODONE HCL 5 MG PO TABS: 5.0000 mg | ORAL_TABLET | ORAL | Status: DC | PRN

## 2016-09-13 MED ORDER — SODIUM CHLORIDE 0.9 % IR SOLN
Status: DC | PRN
  Administered 2016-09-13: 500 mL

## 2016-09-13 MED ORDER — CYCLOBENZAPRINE HCL 10 MG PO TABS: 10.0000 mg | ORAL_TABLET | Freq: Three times a day (TID) | ORAL | Status: DC | PRN

## 2016-09-13 MED ORDER — PHENYLEPHRINE 40 MCG/ML (10ML) SYRINGE FOR IV PUSH (FOR BLOOD PRESSURE SUPPORT)
PREFILLED_SYRINGE | INTRAVENOUS | Status: AC
  Filled 2016-09-13: qty 40

## 2016-09-13 MED ORDER — ROCURONIUM BROMIDE 10 MG/ML (PF) SYRINGE
PREFILLED_SYRINGE | INTRAVENOUS | Status: AC
  Filled 2016-09-13: qty 5

## 2016-09-13 MED ORDER — CEFAZOLIN SODIUM-DEXTROSE 2-4 GM/100ML-% IV SOLN
2.0000 g | INTRAVENOUS | Status: AC
  Administered 2016-09-13: 2 g via INTRAVENOUS
  Filled 2016-09-13: qty 100

## 2016-09-13 MED ORDER — CHLORHEXIDINE GLUCONATE CLOTH 2 % EX PADS: 6.0000 | MEDICATED_PAD | Freq: Once | CUTANEOUS | Status: DC

## 2016-09-13 MED ORDER — ACETAMINOPHEN 650 MG RE SUPP: 650.0000 mg | RECTAL | Status: DC | PRN

## 2016-09-13 MED ORDER — ROCURONIUM BROMIDE 100 MG/10ML IV SOLN
INTRAVENOUS | Status: DC | PRN
  Administered 2016-09-13: 10 mg via INTRAVENOUS
  Administered 2016-09-13: 50 mg via INTRAVENOUS
  Administered 2016-09-13: 10 mg via INTRAVENOUS
  Administered 2016-09-13: 20 mg via INTRAVENOUS
  Administered 2016-09-13: 10 mg via INTRAVENOUS

## 2016-09-13 MED ORDER — HYDROMORPHONE HCL 1 MG/ML IJ SOLN: 0.5000 mg | INTRAMUSCULAR | Status: DC | PRN

## 2016-09-13 MED ORDER — LACTATED RINGERS IV SOLN
INTRAVENOUS | Status: DC | PRN
  Administered 2016-09-13 (×2): via INTRAVENOUS

## 2016-09-13 MED ORDER — LIDOCAINE-EPINEPHRINE 1 %-1:100000 IJ SOLN
INTRAMUSCULAR | Status: AC
  Filled 2016-09-13: qty 1

## 2016-09-13 MED ORDER — PHENYLEPHRINE 40 MCG/ML (10ML) SYRINGE FOR IV PUSH (FOR BLOOD PRESSURE SUPPORT)
PREFILLED_SYRINGE | INTRAVENOUS | Status: AC
  Filled 2016-09-13: qty 10

## 2016-09-13 MED ORDER — SUGAMMADEX SODIUM 200 MG/2ML IV SOLN
INTRAVENOUS | Status: AC
  Filled 2016-09-13: qty 4

## 2016-09-13 MED ORDER — 0.9 % SODIUM CHLORIDE (POUR BTL) OPTIME
TOPICAL | Status: DC | PRN
  Administered 2016-09-13: 1000 mL

## 2016-09-13 MED ORDER — BUPIVACAINE HCL (PF) 0.25 % IJ SOLN
INTRAMUSCULAR | Status: DC | PRN
  Administered 2016-09-13: 5 mL

## 2016-09-13 MED ORDER — ONDANSETRON HCL 4 MG/2ML IJ SOLN: 4.0000 mg | Freq: Four times a day (QID) | INTRAMUSCULAR | Status: DC | PRN

## 2016-09-13 MED ORDER — ONDANSETRON HCL 4 MG/2ML IJ SOLN
INTRAMUSCULAR | Status: AC
  Filled 2016-09-13: qty 2

## 2016-09-13 MED ORDER — METFORMIN HCL ER 500 MG PO TB24
1000.0000 mg | ORAL_TABLET | Freq: Two times a day (BID) | ORAL | Status: DC
  Administered 2016-09-13 – 2016-09-14 (×2): 1000 mg via ORAL
  Filled 2016-09-13 (×2): qty 2

## 2016-09-13 MED ORDER — FENTANYL CITRATE (PF) 250 MCG/5ML IJ SOLN
INTRAMUSCULAR | Status: DC | PRN
  Administered 2016-09-13: 100 ug via INTRAVENOUS

## 2016-09-13 MED ORDER — DEXMEDETOMIDINE HCL IN NACL 200 MCG/50ML IV SOLN
INTRAVENOUS | Status: AC
  Filled 2016-09-13: qty 50

## 2016-09-13 MED ORDER — THROMBIN 5000 UNITS EX SOLR
CUTANEOUS | Status: AC
  Filled 2016-09-13: qty 10000

## 2016-09-13 MED ORDER — THROMBIN 5000 UNITS EX SOLR
CUTANEOUS | Status: DC | PRN
  Administered 2016-09-13 (×2): 5000 [IU] via TOPICAL

## 2016-09-13 MED ORDER — SUGAMMADEX SODIUM 500 MG/5ML IV SOLN
INTRAVENOUS | Status: DC | PRN
  Administered 2016-09-13: 350 mg via INTRAVENOUS

## 2016-09-13 MED ORDER — PROPAFENONE HCL ER 425 MG PO CP12
425.0000 mg | ORAL_CAPSULE | Freq: Two times a day (BID) | ORAL | Status: DC
  Filled 2016-09-13 (×2): qty 1

## 2016-09-13 MED ORDER — LIDOCAINE-EPINEPHRINE 1 %-1:100000 IJ SOLN
INTRAMUSCULAR | Status: DC | PRN
  Administered 2016-09-13: 5 mL

## 2016-09-13 MED ORDER — B COMPLEX-C PO TABS
1.0000 | ORAL_TABLET | Freq: Every day | ORAL | Status: DC
  Administered 2016-09-14: 1 via ORAL
  Filled 2016-09-13 (×2): qty 1

## 2016-09-13 MED ORDER — SODIUM CHLORIDE 0.9% FLUSH
3.0000 mL | Freq: Two times a day (BID) | INTRAVENOUS | Status: DC
  Administered 2016-09-13: 3 mL via INTRAVENOUS

## 2016-09-13 MED ORDER — ONDANSETRON HCL 4 MG/2ML IJ SOLN
INTRAMUSCULAR | Status: DC | PRN
  Administered 2016-09-13: 4 mg via INTRAVENOUS

## 2016-09-13 MED ORDER — FENTANYL CITRATE (PF) 250 MCG/5ML IJ SOLN
INTRAMUSCULAR | Status: AC
  Filled 2016-09-13: qty 5

## 2016-09-13 MED ORDER — HEMOSTATIC AGENTS (NO CHARGE) OPTIME
TOPICAL | Status: DC | PRN
  Administered 2016-09-13: 1 via TOPICAL

## 2016-09-13 MED ORDER — SUGAMMADEX SODIUM 500 MG/5ML IV SOLN
INTRAVENOUS | Status: AC
  Filled 2016-09-13: qty 10

## 2016-09-13 MED ORDER — ONDANSETRON HCL 4 MG PO TABS: 4.0000 mg | ORAL_TABLET | Freq: Four times a day (QID) | ORAL | Status: DC | PRN

## 2016-09-13 MED ORDER — SUGAMMADEX SODIUM 200 MG/2ML IV SOLN
INTRAVENOUS | Status: AC
  Filled 2016-09-13: qty 6

## 2016-09-13 MED ORDER — EPHEDRINE SULFATE 50 MG/ML IJ SOLN
INTRAMUSCULAR | Status: DC | PRN
  Administered 2016-09-13 (×2): 5 mg via INTRAVENOUS

## 2016-09-13 MED ORDER — FINASTERIDE 5 MG PO TABS
5.0000 mg | ORAL_TABLET | Freq: Every day | ORAL | Status: DC
  Administered 2016-09-13 – 2016-09-14 (×2): 5 mg via ORAL
  Filled 2016-09-13 (×2): qty 1

## 2016-09-13 MED ORDER — LABETALOL HCL 5 MG/ML IV SOLN: 5.0000 mg | INTRAVENOUS | Status: DC | PRN

## 2016-09-13 MED ORDER — ACETAMINOPHEN 325 MG PO TABS
650.0000 mg | ORAL_TABLET | ORAL | Status: DC | PRN
  Administered 2016-09-13: 650 mg via ORAL
  Filled 2016-09-13: qty 2

## 2016-09-13 MED ORDER — MIDAZOLAM HCL 2 MG/2ML IJ SOLN
INTRAMUSCULAR | Status: AC
  Filled 2016-09-13: qty 2

## 2016-09-13 MED ORDER — LIDOCAINE 2% (20 MG/ML) 5 ML SYRINGE
INTRAMUSCULAR | Status: AC
  Filled 2016-09-13: qty 5

## 2016-09-13 MED ORDER — SODIUM CHLORIDE 0.9 % IV SOLN: 250.0000 mL | INTRAVENOUS | Status: DC

## 2016-09-13 MED ORDER — DEXAMETHASONE SODIUM PHOSPHATE 10 MG/ML IJ SOLN
10.0000 mg | INTRAMUSCULAR | Status: AC
  Administered 2016-09-13: 10 mg via INTRAVENOUS
  Filled 2016-09-13: qty 1

## 2016-09-13 MED ORDER — METOPROLOL SUCCINATE ER 25 MG PO TB24
25.0000 mg | ORAL_TABLET | Freq: Two times a day (BID) | ORAL | Status: DC
  Administered 2016-09-13 – 2016-09-14 (×2): 25 mg via ORAL
  Filled 2016-09-13 (×2): qty 1

## 2016-09-13 MED ORDER — HYDROMORPHONE HCL 1 MG/ML IJ SOLN: 0.2500 mg | INTRAMUSCULAR | Status: DC | PRN

## 2016-09-13 MED ORDER — PROMETHAZINE HCL 25 MG/ML IJ SOLN: 6.2500 mg | INTRAMUSCULAR | Status: DC | PRN

## 2016-09-13 MED ORDER — ROCURONIUM BROMIDE 10 MG/ML (PF) SYRINGE
PREFILLED_SYRINGE | INTRAVENOUS | Status: AC
  Filled 2016-09-13: qty 10

## 2016-09-13 MED ORDER — PROPOFOL 10 MG/ML IV BOLUS
INTRAVENOUS | Status: AC
  Filled 2016-09-13: qty 20

## 2016-09-13 MED ORDER — MENTHOL 3 MG MT LOZG: 1.0000 | LOZENGE | OROMUCOSAL | Status: DC | PRN

## 2016-09-13 MED ORDER — BUPIVACAINE HCL (PF) 0.25 % IJ SOLN
INTRAMUSCULAR | Status: AC
  Filled 2016-09-13: qty 30

## 2016-09-13 SURGICAL SUPPLY — 56 items
BAG DECANTER FOR FLEXI CONT (MISCELLANEOUS) ×2 IMPLANT
BENZOIN TINCTURE PRP APPL 2/3 (GAUZE/BANDAGES/DRESSINGS) ×2 IMPLANT
BLADE CLIPPER SURG (BLADE) IMPLANT
BLADE SURG 11 STRL SS (BLADE) ×2 IMPLANT
BUR CUTTER 7.0 ROUND (BURR) ×2 IMPLANT
BUR MATCHSTICK NEURO 3.0 LAGG (BURR) ×2 IMPLANT
CANISTER SUCT 3000ML PPV (MISCELLANEOUS) ×2 IMPLANT
CARTRIDGE OIL MAESTRO DRILL (MISCELLANEOUS) ×1 IMPLANT
DECANTER SPIKE VIAL GLASS SM (MISCELLANEOUS) ×2 IMPLANT
DERMABOND ADVANCED (GAUZE/BANDAGES/DRESSINGS) ×1
DERMABOND ADVANCED .7 DNX12 (GAUZE/BANDAGES/DRESSINGS) ×1 IMPLANT
DIFFUSER DRILL AIR PNEUMATIC (MISCELLANEOUS) ×2 IMPLANT
DRAPE HALF SHEET 40X57 (DRAPES) ×2 IMPLANT
DRAPE LAPAROTOMY 100X72X124 (DRAPES) ×2 IMPLANT
DRAPE MICROSCOPE LEICA (MISCELLANEOUS) ×2 IMPLANT
DRAPE POUCH INSTRU U-SHP 10X18 (DRAPES) ×2 IMPLANT
DRAPE SURG 17X23 STRL (DRAPES) ×2 IMPLANT
DRSG OPSITE 4X5.5 SM (GAUZE/BANDAGES/DRESSINGS) ×2 IMPLANT
DRSG OPSITE POSTOP 3X4 (GAUZE/BANDAGES/DRESSINGS) ×2 IMPLANT
DURAPREP 26ML APPLICATOR (WOUND CARE) ×2 IMPLANT
ELECT REM PT RETURN 9FT ADLT (ELECTROSURGICAL) ×2
ELECTRODE REM PT RTRN 9FT ADLT (ELECTROSURGICAL) ×1 IMPLANT
EVACUATOR 1/8 PVC DRAIN (DRAIN) ×2 IMPLANT
GAUZE SPONGE 4X4 12PLY STRL (GAUZE/BANDAGES/DRESSINGS) ×2 IMPLANT
GAUZE SPONGE 4X4 16PLY XRAY LF (GAUZE/BANDAGES/DRESSINGS) ×2 IMPLANT
GLOVE BIO SURGEON STRL SZ7 (GLOVE) ×2 IMPLANT
GLOVE BIO SURGEON STRL SZ8 (GLOVE) ×2 IMPLANT
GLOVE BIOGEL PI IND STRL 7.0 (GLOVE) ×1 IMPLANT
GLOVE BIOGEL PI INDICATOR 7.0 (GLOVE) ×1
GLOVE ECLIPSE 7.5 STRL STRAW (GLOVE) IMPLANT
GLOVE EXAM NITRILE LRG STRL (GLOVE) IMPLANT
GLOVE EXAM NITRILE XL STR (GLOVE) IMPLANT
GLOVE EXAM NITRILE XS STR PU (GLOVE) IMPLANT
GLOVE INDICATOR 8.5 STRL (GLOVE) ×2 IMPLANT
GOWN STRL REUS W/ TWL LRG LVL3 (GOWN DISPOSABLE) ×2 IMPLANT
GOWN STRL REUS W/ TWL XL LVL3 (GOWN DISPOSABLE) ×2 IMPLANT
GOWN STRL REUS W/TWL 2XL LVL3 (GOWN DISPOSABLE) IMPLANT
GOWN STRL REUS W/TWL LRG LVL3 (GOWN DISPOSABLE) ×2
GOWN STRL REUS W/TWL XL LVL3 (GOWN DISPOSABLE) ×2
KIT BASIN OR (CUSTOM PROCEDURE TRAY) ×2 IMPLANT
KIT ROOM TURNOVER OR (KITS) ×2 IMPLANT
NEEDLE HYPO 22GX1.5 SAFETY (NEEDLE) ×2 IMPLANT
NEEDLE SPNL 22GX3.5 QUINCKE BK (NEEDLE) ×2 IMPLANT
NS IRRIG 1000ML POUR BTL (IV SOLUTION) ×2 IMPLANT
OIL CARTRIDGE MAESTRO DRILL (MISCELLANEOUS) ×2
PACK LAMINECTOMY NEURO (CUSTOM PROCEDURE TRAY) ×2 IMPLANT
RUBBERBAND STERILE (MISCELLANEOUS) ×4 IMPLANT
SPONGE SURGIFOAM ABS GEL SZ50 (HEMOSTASIS) ×2 IMPLANT
STRIP CLOSURE SKIN 1/2X4 (GAUZE/BANDAGES/DRESSINGS) ×2 IMPLANT
SUT VIC AB 0 CT1 18XCR BRD8 (SUTURE) ×1 IMPLANT
SUT VIC AB 0 CT1 8-18 (SUTURE) ×1
SUT VIC AB 2-0 CT1 18 (SUTURE) ×2 IMPLANT
SUT VICRYL 4-0 PS2 18IN ABS (SUTURE) ×2 IMPLANT
TOWEL GREEN STERILE (TOWEL DISPOSABLE) ×2 IMPLANT
TOWEL GREEN STERILE FF (TOWEL DISPOSABLE) ×2 IMPLANT
WATER STERILE IRR 1000ML POUR (IV SOLUTION) ×2 IMPLANT

## 2016-09-13 NOTE — H&P (Signed)
Sean Reyes is an 76 y.o. male.   Chief Complaint: Back and left leg pain HPI: 76 year old gym is a progress worsening back and left buttock and leg pain rating to his anterior quad front of his shin. Workup revealed severe spondylosis at L2-3 and L3-4 predominantly on the left. Patient been refractory to all forms of conservative treatment with anterior laboratory physical therapy and steroid injections. Due to patient's progression of clinical syndrome imaging findings and failure conservative treatment I recommended decompressive laminotomies at L2-3 and L3-4. I've extensively reviewed the risks and benefits of the operation with him as well as perioperative course expectations of outcome and alternatives of surgery and he understood and agreed to proceed forward.  Past Medical History:  Diagnosis Date  . Anxiety   . Arthritis   . Atrial fibrillation ([place])   . Complication of anesthesia    pt reports low BP's after surgery at [facility] and difficulty awakening  . Depression   . Diabetes ([place])   . Hyperlipidemia   . Hypertension   . S/P ablation of atrial fibrillation    Ablative therapy  . Sleep apnea   . Tachycardia, unspecified     Past Surgical History:  Procedure Laterality Date  . ABLATION    . COLONOSCOPY WITH PROPOFOL N/A 10/05/2015   Procedure: COLONOSCOPY WITH PROPOFOL;  Surgeon: Lollie Sails, MD;  Location: [facility] ENDOSCOPY;  Service: Endoscopy;  Laterality: N/A;  . HERNIA REPAIR    . JOINT REPLACEMENT Bilateral    hips  . TONSILLECTOMY      Family History  Problem Relation Age of Onset  . Brain cancer Mother   . Kidney disease Neg Hx   . Prostate cancer Neg Hx   . Kidney cancer Neg Hx   . Bladder Cancer Neg Hx    Social History:  reports that he has never smoked. He has never used smokeless tobacco. He reports that he does not drink alcohol or use drugs.  Allergies:  Allergies  Allergen Reactions  . Levaquin [Levofloxacin In D5w] Anaphylaxis and  Shortness Of Breath  . Shellfish Allergy Anaphylaxis  . Amiodarone Other (See Comments)    Tremors and thyroid toxicity    Medications Prior to Admission  Medication Sig Dispense Refill  . B Complex-C (B-COMPLEX WITH VITAMIN C) tablet Take 1 tablet by mouth daily.    . clomiPHENE (CLOMID) 50 MG tablet 1/2 tablet daily (Patient taking differently: Take 25 mg by mouth at bedtime. ) 30 tablet 3  . finasteride (PROSCAR) 5 MG tablet Take 1 tablet (5 mg total) by mouth daily. 30 tablet 0  . metFORMIN (GLUCOPHAGE-XR) 500 MG 24 hr tablet Take 2 tablets (1,000 mg total) by mouth 2 (two) times daily. 120 tablet 0  . metoprolol succinate (TOPROL-XL) 25 MG 24 hr tablet Take 1 tablet (25 mg total) by mouth 2 (two) times daily. 60 tablet 12  . propafenone (RYTHMOL SR) 425 MG 12 hr capsule Take 425 mg by mouth 2 (two) times daily.    . tamsulosin (FLOMAX) 0.4 MG CAPS capsule 1 Capsule daily ORAL (Patient taking differently: Take 0.4 mg by mouth at bedtime. ) 30 capsule 12  . warfarin (COUMADIN) 1 MG tablet Take 0.5 tablets (0.5 mg total) by mouth every other day. take as directed (Patient taking differently: Take 0.5 mg by mouth every other day. In the evening) 90 tablet 1  . warfarin (COUMADIN) 4 MG tablet Take 1 tablet (4 mg total) by mouth one time only at  6 PM. (Patient taking differently: Take 4 mg by mouth every evening. ) 90 tablet 1    Results for orders placed or performed during the hospital encounter of 09/13/16 (from the past 48 hour(s))  PT- INR Day of Surgery     Status: Abnormal   Collection Time: 09/13/16  6:39 AM  Result Value Ref Range   Prothrombin Time 15.3 (H) 11.4 - 15.2 seconds   INR 1.20    No results found.  Review of Systems  Musculoskeletal: Positive for back pain, joint pain and myalgias.  Neurological: Positive for tingling, sensory change and focal weakness.  All other systems reviewed and are negative.   Blood pressure (!) 151/76, temperature 98.4 F (36.9 C), resp.  rate 18. Physical Exam  Constitutional: He is oriented to person, place, and time. He appears well-developed and well-nourished.  HENT:  Head: Normocephalic.  Eyes: Pupils are equal, round, and reactive to light.  Neck: Normal range of motion.  Cardiovascular: Normal rate.   Respiratory: Effort normal.  GI: Soft. Bowel sounds are normal.  Musculoskeletal: Normal range of motion.  Neurological: He is alert and oriented to person, place, and time. He has normal strength. GCS eye subscore is 4. GCS verbal subscore is 5. GCS motor subscore is 6.  Strength right lower extremity 5 out of 5 iliopsoas, quads, hip she's, gastric, into tibialis, and EHL. Strength left lower extremity has 4+ out of 5 iliopsoas and quads 5 out of 5 distally.     Assessment/Plan 76 year old gentleman presents for decompressive laminotomies possible discectomies at L2-3 and L3-4 on the left.  Shafin Pollio P, MD 09/13/2016, 8:01 AM

## 2016-09-13 NOTE — Anesthesia Preprocedure Evaluation (Addendum)
Anesthesia Evaluation  Patient identified by MRN, date of birth, ID band Patient awake    Reviewed: Allergy & Precautions, NPO status , Patient's Chart, lab work & pertinent test results  History of Anesthesia Complications (+) PROLONGED EMERGENCE  Airway Mallampati: II   Neck ROM: Full    Dental no notable dental hx. (+) Teeth Intact   Pulmonary sleep apnea ,    breath sounds clear to auscultation       Cardiovascular hypertension, + dysrhythmias  Rhythm:Regular Rate:Normal     Neuro/Psych    GI/Hepatic negative GI ROS, Neg liver ROS,   Endo/Other  diabetes  Renal/GU Renal InsufficiencyRenal disease     Musculoskeletal  (+) Arthritis ,   Abdominal   Peds  Hematology   Anesthesia Other Findings   Reproductive/Obstetrics                            Anesthesia Physical Anesthesia Plan  ASA: III  Anesthesia Plan: General   Post-op Pain Management:    Induction: Intravenous  PONV Risk Score and Plan: 3 and Ondansetron, Dexamethasone, Midazolam and Propofol infusion  Airway Management Planned: Oral ETT  Additional Equipment:   Intra-op Plan:   Post-operative Plan: Extubation in OR  Informed Consent: I have reviewed the patients History and Physical, chart, labs and discussed the procedure including the risks, benefits and alternatives for the proposed anesthesia with the patient or authorized representative who has indicated his/her understanding and acceptance.   Dental advisory given  Plan Discussed with: CRNA  Anesthesia Plan Comments:         Anesthesia Quick Evaluation

## 2016-09-13 NOTE — Op Note (Signed)
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  Postoperative diagnosis: Same  Procedure: Decompressive lumbar laminectomy L2-3 on the left and L3-4 on the left with microdissection and microscopic foraminotomies of the L3 and L4 nerve roots with partial medial facetectomies.  Surgeon: Dominica Severin Jemila Camille  Asst.: Glenford Peers  Anesthesia: Gen.  EBL: Minimal  History of present illness: Patient is very pleasant 76 year old gentleman who has had is a progressive back and left hip and leg pain. Workup revealed spondylosis spinal stenosis L3-4 and L2-3 to the patient's failure conservative treatment imaging findings and progression of clinical syndrome I recommended decompressive laminotomies at L2-3 and L3-4 I extensively reviewed the risks and benefits of the operation with the patient as well as perioperative course expectations of a outcome and alternatives of surgery and he understood and agreed to proceed forward  Operative procedure: Patient brought into the or was induced under general anesthesia positioned prone the Wilson frame his back was prepped and draped in routine sterile fashion preoperative x-ray localize the appropriate level so after infiltration of 10 mL lidocaine with epi a midline incision was made and Bovie left car was used to take down subcutaneous tissues and subperiosteal dissections care lamina of L2-3 and L3-4. Then high-speed drill was used to drill down the inferior aspect lateral to medial facet complex super aspect of lamina of L3 and in the inferior aspect of lamina L3 medial facet complex super aspect of lamina of L4. Laminotomy was, the 10th of the Kerrison punch ligament flavum was a markedly hypertrophied and this was removed in piecemeal fashion this allowed identification of thecal sac. Then the operating microscope was draped and brought into the field under microscopic illumination further under biting  the medial gutter allowed indication of the L3 and L4 pedicles under biting of the medial gutter decompress the L3 and L4 nerve roots I looked at the L2-3 disc space was spondylitic but was not causing a significant amount of compression. I then unroofed the foramen at L3 and L4 decompressing the L3 and L4 nerve roots. There was a large spur at L3-4 displacing the proximal L4 nerve root and the pathology was also spondylosis with a large spur at L2-3. The wounds and copious irrigated meticulous hemostasis was maintained a medium Hemovac drain was placed and the wounds closed in layers with interrupted Vicryl skin was closed running 4 septic particular Dermabond benzo and Steri-Strips and a sterile dressing was applied and patient recovered in stable condition.

## 2016-09-13 NOTE — Anesthesia Postprocedure Evaluation (Signed)
Anesthesia Post Note  Patient: Jules Schick  Procedure(s) Performed: Procedure(s) (LRB): Microdiscectomy - Lumbar two-three,  Lumbar three- - left (Left)     Patient location during evaluation: PACU Anesthesia Type: General Level of consciousness: awake, sedated and oriented Pain management: pain level controlled Vital Signs Assessment: post-procedure vital signs reviewed and stable Respiratory status: spontaneous breathing, nonlabored ventilation, respiratory function stable and patient connected to nasal cannula oxygen Cardiovascular status: blood pressure returned to baseline and stable Postop Assessment: no signs of nausea or vomiting Anesthetic complications: no    Last Vitals:  Vitals:   09/13/16 1145 09/13/16 1213  BP: (!) 146/76 (!) 177/91  Pulse: 61 62  Resp: 14 18  Temp: (!) 36.2 C (!) 36.2 C    Last Pain: There were no vitals filed for this visit.               Catalea Labrecque,JAMES TERRILL

## 2016-09-13 NOTE — Anesthesia Procedure Notes (Signed)
Procedure Name: Intubation Date/Time: 09/13/2016 8:34 AM Performed by: Mariea Clonts Pre-anesthesia Checklist: Patient identified, Emergency Drugs available, Suction available and Patient being monitored Patient Re-evaluated:Patient Re-evaluated prior to induction Oxygen Delivery Method: Circle System Utilized Preoxygenation: Pre-oxygenation with 100% oxygen Induction Type: IV induction Ventilation: Mask ventilation without difficulty Laryngoscope Size: Miller and 3 Grade View: Grade I Tube type: Oral Tube size: 8.0 mm Number of attempts: 1 Airway Equipment and Method: Stylet and Oral airway Placement Confirmation: ETT inserted through vocal cords under direct vision,  positive ETCO2 and breath sounds checked- equal and bilateral Tube secured with: Tape Dental Injury: Teeth and Oropharynx as per pre-operative assessment

## 2016-09-13 NOTE — Transfer of Care (Signed)
Immediate Anesthesia Transfer of Care Note  Patient: Sean Reyes  Procedure(s) Performed: Procedure(s): Microdiscectomy - Lumbar two-three,  Lumbar three- - left (Left)  Patient Location: PACU  Anesthesia Type:General  Level of Consciousness: awake, alert  and oriented  Airway & Oxygen Therapy: Patient Spontanous Breathing and Patient connected to nasal cannula oxygen  Post-op Assessment: Report given to RN, Post -op Vital signs reviewed and stable and Patient moving all extremities X 4  Post vital signs: Reviewed and stable  Last Vitals:  Vitals:   09/13/16 1100 09/13/16 1105  BP: (!) 130/117 136/74  Pulse: 60 62  Resp: 11 12  Temp: (!) 36.2 C     Last Pain: There were no vitals filed for this visit.       Complications: No apparent anesthesia complications

## 2016-09-13 NOTE — OR Nursing (Signed)
Labetalol mistakenly ordered on wrong patient. Already d/c 2nd to change in phase of care and correct order added to correct patient.

## 2016-09-14 ENCOUNTER — Encounter (HOSPITAL_COMMUNITY): Payer: Self-pay | Admitting: Neurosurgery

## 2016-09-14 DIAGNOSIS — I10 Essential (primary) hypertension: Secondary | ICD-10-CM | POA: Diagnosis not present

## 2016-09-14 DIAGNOSIS — I4891 Unspecified atrial fibrillation: Secondary | ICD-10-CM | POA: Diagnosis not present

## 2016-09-14 DIAGNOSIS — M4726 Other spondylosis with radiculopathy, lumbar region: Secondary | ICD-10-CM | POA: Diagnosis not present

## 2016-09-14 DIAGNOSIS — E785 Hyperlipidemia, unspecified: Secondary | ICD-10-CM | POA: Diagnosis not present

## 2016-09-14 DIAGNOSIS — M48061 Spinal stenosis, lumbar region without neurogenic claudication: Secondary | ICD-10-CM | POA: Diagnosis not present

## 2016-09-14 DIAGNOSIS — G4730 Sleep apnea, unspecified: Secondary | ICD-10-CM | POA: Diagnosis not present

## 2016-09-14 LAB — GLUCOSE, CAPILLARY: Glucose-Capillary: 127 mg/dL — ABNORMAL HIGH (ref 65–99)

## 2016-09-14 MED ORDER — CYCLOBENZAPRINE HCL 10 MG PO TABS: 10.0000 mg | ORAL_TABLET | Freq: Three times a day (TID) | ORAL | 0 refills | Status: DC | PRN

## 2016-09-14 MED ORDER — HYDROCODONE-ACETAMINOPHEN 5-325 MG PO TABS: 1.0000 | ORAL_TABLET | ORAL | 0 refills | Status: DC | PRN

## 2016-09-14 NOTE — Discharge Summary (Signed)
Physician Discharge Summary  Patient ID: Sean Reyes MRN: [id number] DOB/AGE: [id number] y.o.  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    Discharge Diagnoses: same as admitting    Discharged Condition: good  Hospital Course: The patient was admitted on 09/13/2016 and taken to the operating room where the patient underwent Decompressive lumbar laminectomy L2-3 on the left and L3-4 on the left with microdissection and microscopic foraminotomies of the L3 and L4 nerve roots with partial medial facetectomies. The patient tolerated the procedure well and was taken to the recovery room and then to the floor in stable condition. The hospital course was routine. There were no complications. The wound remained clean dry and intact. Pt had appropriate back soreness. No complaints of leg pain or new N/T/W. The patient remained afebrile with stable vital signs, and tolerated a regular diet. The patient continued to increase activities, and pain was well controlled with oral pain medications.   Consults: None  Significant Diagnostic Studies:  Results for orders placed or performed during the hospital encounter of 09/13/16  PT- INR Day of Surgery  Result Value Ref Range   Prothrombin Time 15.3 (H) 11.4 - 15.2 seconds   INR 1.20   Glucose, capillary  Result Value Ref Range   Glucose-Capillary 131 (H) 65 - 99 mg/dL  Glucose, capillary  Result Value Ref Range   Glucose-Capillary 150 (H) 65 - 99 mg/dL   Comment 1 Notify RN   Glucose, capillary  Result Value Ref Range   Glucose-Capillary 160 (H) 65 - 99 mg/dL  Glucose, capillary  Result Value Ref Range   Glucose-Capillary 229 (H) 65 - 99 mg/dL  Glucose, capillary  Result Value Ref Range   Glucose-Capillary 199 (H) 65 - 99 mg/dL   Comment 1 Notify RN    Comment 2 Document in Chart   Glucose, capillary  Result Value Ref Range   Glucose-Capillary 127 (H) 65 - 99 mg/dL    Dg Lumbar Spine 2-3 Views  Result Date: 09/13/2016 CLINICAL DATA:  76 year old male undergoing microdiscectomy. Intraoperative localization radiographs. EXAM: LUMBAR SPINE - 2-3 VIEW COMPARISON:  Preoperative lumbar spine MRI 07/26/2016 FINDINGS: Cross-table lateral radiographs are submitted. The first radiograph demonstrates a needle in the soft tissues posterior to the L4 spinous process. On a second image, a metallic probe is present at the L3-L4 facet joint. Soft tissue spreaders are noted in the posterior soft tissues. IMPRESSION: Intraoperative localization radiographs as above. Electronically Signed   By: Jacqulynn Cadet M.D.   On: 09/13/2016 10:11    Antibiotics:  Anti-infectives    Start     Dose/Rate Route Frequency Ordered Stop   09/13/16 1630  ceFAZolin (ANCEF) IVPB 2g/100 mL premix     2 g 200 mL/hr over 30 Minutes Intravenous Every 8 hours 09/13/16 1214 09/14/16 0102   09/13/16 0902  bacitracin 50,000 Units in sodium chloride irrigation 0.9 % 500 mL irrigation  Status:  Discontinued       As needed 09/13/16 0902 09/13/16 1053   09/13/16 0631  ceFAZolin (ANCEF) IVPB 2g/100 mL premix     2 g 200 mL/hr over 30 Minutes Intravenous On call to O.R. 09/13/16 0631 09/13/16 0843      Discharge Exam: Blood pressure 119/76, pulse 65, temperature 98.4 F (36.9 C), temperature source Oral, resp. rate 20, SpO2 98 %. Neurologic: Grossly normal Patient ambulating and voiding well. Pain is tolerable at 4-5. Denies any  NTW in legs. Very pleased postop.  Discharge Medications:   Allergies as of 09/14/2016      Reactions   Levaquin [levofloxacin In D5w] Anaphylaxis, Shortness Of Breath   Shellfish Allergy Anaphylaxis   Amiodarone Other (See Comments)   Tremors and thyroid toxicity      Medication List    STOP taking these medications   warfarin 1 MG tablet Commonly known as:  COUMADIN   warfarin 4 MG tablet Commonly known as:  COUMADIN      TAKE these medications   B-complex with vitamin C tablet Take 1 tablet by mouth daily.   clomiPHENE 50 MG tablet Commonly known as:  CLOMID 1/2 tablet daily What changed:  how much to take  how to take this  when to take this  additional instructions   cyclobenzaprine 10 MG tablet Commonly known as:  FLEXERIL Take 1 tablet (10 mg total) by mouth 3 (three) times daily as needed for muscle spasms.   finasteride 5 MG tablet Commonly known as:  PROSCAR Take 1 tablet (5 mg total) by mouth daily.   HYDROcodone-acetaminophen 5-325 MG tablet Commonly known as:  NORCO/VICODIN Take 1 tablet by mouth every 4 (four) hours as needed for moderate pain.   metFORMIN 500 MG 24 hr tablet Commonly known as:  GLUCOPHAGE-XR Take 2 tablets (1,000 mg total) by mouth 2 (two) times daily.   metoprolol succinate 25 MG 24 hr tablet Commonly known as:  TOPROL-XL Take 1 tablet (25 mg total) by mouth 2 (two) times daily.   propafenone 425 MG 12 hr capsule Commonly known as:  RYTHMOL SR Take 425 mg by mouth 2 (two) times daily.   tamsulosin 0.4 MG Caps capsule Commonly known as:  FLOMAX 1 Capsule daily ORAL What changed:  how much to take  how to take this  when to take this  additional instructions       Disposition: home   Final Dx: same as admitting  Discharge Instructions    Call MD for:  difficulty breathing, headache or visual disturbances    Complete by:  As directed    Call MD for:  extreme fatigue    Complete by:  As directed    Call MD for:  hives    Complete by:  As directed    Call MD for:  persistant dizziness or light-headedness    Complete by:  As directed    Call MD for:  persistant nausea and vomiting    Complete by:  As directed    Call MD for:  redness, tenderness, or signs of infection (pain, swelling, redness, odor or green/yellow discharge around incision site)    Complete by:  As directed    Call MD for:  severe uncontrolled pain    Complete by:   As directed    Call MD for:  temperature >100.4    Complete by:  As directed    Diet - low sodium heart healthy    Complete by:  As directed    Increase activity slowly    Complete by:  As directed          Signed: Ocie Cornfield Meghan Warshawsky 09/14/2016, 9:07 AM

## 2016-09-14 NOTE — Care Management CC44 (Signed)
Condition Code 44 Documentation Completed  Patient Details  Name: Sean Reyes MRN: [id number] Date of Birth: November 07, 1940   Condition Code 44 given:  Yes Patient signature on Condition Code 44 notice:  Yes Documentation of 2 MD's agreement:  Yes Code 44 added to claim:  Yes    Ninfa Meeker, RN 09/14/2016, 10:34 AM

## 2016-09-14 NOTE — Progress Notes (Signed)
Patient is discharged from room 3C11 at this time. Alert and in stable condition. IV site d/c'd as well as hemovac with 20 ml output noted. Instructions read to patient and family with understanding verbalized. Left unit via wheelchair with all belongings at side.

## 2016-09-14 NOTE — Progress Notes (Signed)
Inpatient Diabetes Program Recommendations  AACE/ADA: New Consensus Statement on Inpatient Glycemic Control (2015)  Target Ranges:  Prepandial:   less than 140 mg/dL      Peak postprandial:   less than 180 mg/dL (1-2 hours)      Critically ill patients:  140 - 180 mg/dL   Lab Results  Component Value Date   GLUCAP 127 (H) 09/14/2016   HGBA1C 6.5 (H) 09/07/2016    Review of Glycemic Control  Blood sugars > 180 mg/dL. Needs Novolog correction.  Inpatient Diabetes Program Recommendations:   Add Novolog 0-9 units tidwc.  Will follow.  Thank you. Lorenda Peck, RD, LDN, CDE Inpatient Diabetes Coordinator [phone]

## 2016-09-14 NOTE — Discharge Instructions (Signed)
Wound Care  Keep the incision clean and dry remove the outer dressing in 2 days, leave the Steri-Strips intact. Wrap with Saran wrap for showers only Do not put any creams, lotions, or ointments on incision. Leave steri-strips on back.  They will fall off by themselves.  Activity Walk each and every day, increasing distance each day. No lifting greater than 5 lbs.  No lifting no bending no twisting no driving or riding a car unless coming back and forth to see me. If provided with back brace, wear when out of bed.  It is not necessary to wear brace in bed. Diet Resume your normal diet.   Return to Work Will be discussed at you follow up appointment.  Call Your Doctor If Any of These Occur Redness, drainage, or swelling at the wound.  Temperature greater than 101 degrees. Severe pain not relieved by pain medication. Incision starts to come apart. Follow Up Appt Call today for appointment in 1-2 weeks ([phone]) or for problems.  If you have any hardware placed in your spine, you will need an x-ray before your appointment.    TAKE COUMADIN IN 3 DAYS

## 2016-09-14 NOTE — Care Management Obs Status (Signed)
[place] NOTIFICATION   Patient Details  Name: Bolden Hagerman MRN: [id number] Date of Birth: 01/18/1941   Medicare Observation Status Notification Given:  Yes    Ninfa Meeker, RN 09/14/2016, 10:34 AM

## 2016-09-19 ENCOUNTER — Other Ambulatory Visit: Payer: Self-pay | Admitting: Unknown Physician Specialty

## 2016-09-19 DIAGNOSIS — I10 Essential (primary) hypertension: Secondary | ICD-10-CM

## 2016-09-19 DIAGNOSIS — Z23 Encounter for immunization: Secondary | ICD-10-CM

## 2016-09-19 DIAGNOSIS — M5136 Other intervertebral disc degeneration, lumbar region: Secondary | ICD-10-CM

## 2016-09-19 DIAGNOSIS — E119 Type 2 diabetes mellitus without complications: Secondary | ICD-10-CM

## 2016-09-19 DIAGNOSIS — Z0000 Encounter for general adult medical examination without abnormal findings: Secondary | ICD-10-CM

## 2016-09-19 DIAGNOSIS — N138 Other obstructive and reflux uropathy: Secondary | ICD-10-CM

## 2016-09-19 DIAGNOSIS — I4891 Unspecified atrial fibrillation: Secondary | ICD-10-CM

## 2016-09-19 DIAGNOSIS — N401 Enlarged prostate with lower urinary tract symptoms: Principal | ICD-10-CM

## 2016-09-19 DIAGNOSIS — M51369 Other intervertebral disc degeneration, lumbar region without mention of lumbar back pain or lower extremity pain: Secondary | ICD-10-CM

## 2016-09-28 ENCOUNTER — Ambulatory Visit (INDEPENDENT_AMBULATORY_CARE_PROVIDER_SITE_OTHER): Payer: Medicare Other | Admitting: Family Medicine

## 2016-09-28 ENCOUNTER — Encounter: Payer: Self-pay | Admitting: Family Medicine

## 2016-09-28 VITALS — BP 128/75 | HR 67 | Temp 98.3°F | Wt 198.8 lb

## 2016-09-28 DIAGNOSIS — G4733 Obstructive sleep apnea (adult) (pediatric): Secondary | ICD-10-CM | POA: Diagnosis not present

## 2016-09-28 DIAGNOSIS — N401 Enlarged prostate with lower urinary tract symptoms: Secondary | ICD-10-CM

## 2016-09-28 DIAGNOSIS — E119 Type 2 diabetes mellitus without complications: Secondary | ICD-10-CM | POA: Diagnosis not present

## 2016-09-28 DIAGNOSIS — M5136 Other intervertebral disc degeneration, lumbar region: Secondary | ICD-10-CM | POA: Diagnosis not present

## 2016-09-28 DIAGNOSIS — Z7189 Other specified counseling: Secondary | ICD-10-CM | POA: Diagnosis not present

## 2016-09-28 DIAGNOSIS — Z0000 Encounter for general adult medical examination without abnormal findings: Secondary | ICD-10-CM

## 2016-09-28 DIAGNOSIS — I4891 Unspecified atrial fibrillation: Secondary | ICD-10-CM

## 2016-09-28 DIAGNOSIS — N138 Other obstructive and reflux uropathy: Secondary | ICD-10-CM | POA: Diagnosis not present

## 2016-09-28 DIAGNOSIS — I10 Essential (primary) hypertension: Secondary | ICD-10-CM

## 2016-09-28 DIAGNOSIS — Z23 Encounter for immunization: Secondary | ICD-10-CM

## 2016-09-28 LAB — COAGUCHEK XS/INR WAIVED
INR: 2.1 — ABNORMAL HIGH (ref 0.9–1.1)
Prothrombin Time: 25.5 s

## 2016-09-28 MED ORDER — METOPROLOL SUCCINATE ER 25 MG PO TB24: 25.0000 mg | ORAL_TABLET | Freq: Two times a day (BID) | ORAL | 12 refills | Status: DC

## 2016-09-28 MED ORDER — METFORMIN HCL ER 500 MG PO TB24: ORAL_TABLET | ORAL | 12 refills | Status: DC

## 2016-09-28 NOTE — Assessment & Plan Note (Signed)
All is stable and back on warfarin with stable INR.

## 2016-09-28 NOTE — Progress Notes (Signed)
BP 128/75   Pulse 67   Temp 98.3 F (36.8 C)   Wt 198 lb 12.8 oz (90.2 kg)   SpO2 98%   BMI 26.23 kg/m    Subjective:    Patient ID: Sean Reyes, male    DOB: Feb 27, 1940, 76 y.o.   MRN: [id number]  HPI: Jamee Pacholski is a 76 y.o. male AWV  Metrics met Chief Complaint  Patient presents with  . Atrial Fibrillation    pt alternating 4 and 4.5 mg of warfarin   Patient back to therapeutic and stop warfarin for back surgery which has been successful to date wearing back splint now. Taking warfarin 4 alternating 4-1/2. Without difficulties No bleeding or bruising issues.  Patient having a great deal of difficulty using his CPAP with leakage issues etc. on review with amount of problems will refer back to sleep specialist to review. Patient taking metoprolol for blood pressure and heart regulation blood pressure first in the mornings a little bit high but otherwise blood pressures good. Had back surgery recovering well from that seems to been helpful. Having nocturia got up about 5 times no PND orthopnea just having to get up and do a little dribbles and grabs doesn't seem that Tammy lotions help him at all. For diabetes doing well with metformin no complaints or issues.  Relevant past medical, surgical, family and social history reviewed and updated as indicated. Interim medical history since our last visit reviewed. Allergies and medications reviewed and updated.  Review of Systems  Constitutional: Negative.   HENT: Negative.   Eyes: Negative.   Respiratory: Negative.   Cardiovascular: Negative.   Gastrointestinal: Negative.   Endocrine: Negative.   Genitourinary: Negative.   Musculoskeletal: Negative.   Skin: Negative.   Allergic/Immunologic: Negative.   Neurological: Negative.   Hematological: Negative.   Psychiatric/Behavioral: Negative.     Per HPI unless specifically indicated above     Objective:    BP 128/75   Pulse 67   Temp 98.3 F (36.8 C)   Wt 198 lb  12.8 oz (90.2 kg)   SpO2 98%   BMI 26.23 kg/m   Wt Readings from Last 3 Encounters:  09/28/16 198 lb 12.8 oz (90.2 kg)  09/14/16 202 lb (91.6 kg)  09/07/16 202 lb 11.2 oz (91.9 kg)    Physical Exam  Constitutional: He is oriented to person, place, and time. He appears well-developed and well-nourished.  HENT:  Head: Normocephalic and atraumatic.  Right Ear: External ear normal.  Left Ear: External ear normal.  Nose: Nose normal.  Eyes: Pupils are equal, round, and reactive to light. Conjunctivae and EOM are normal.  Neck: Normal range of motion. Neck supple. No thyromegaly present.  Cardiovascular: Normal rate, regular rhythm, normal heart sounds and intact distal pulses.   Pulmonary/Chest: Effort normal and breath sounds normal.  Abdominal: Soft. Bowel sounds are normal. There is no splenomegaly or hepatomegaly.  Genitourinary: Penis normal.  Genitourinary Comments: Has urology apt next mo  Musculoskeletal: Normal range of motion.  Lymphadenopathy:    He has no cervical adenopathy.  Neurological: He is alert and oriented to person, place, and time. He has normal reflexes.  Skin: Skin is warm and dry. No erythema.  Psychiatric: He has a normal mood and affect. His behavior is normal. Judgment and thought content normal.        Assessment & Plan:   Problem List Items Addressed This Visit      Cardiovascular and Mediastinum   Atrial fibrillation (  [place]) - Primary    All is stable and back on warfarin with stable INR.      Relevant Medications   warfarin (COUMADIN) 4 MG tablet   warfarin (COUMADIN) 1 MG tablet   metoprolol succinate (TOPROL-XL) 25 MG 24 hr tablet   Other Relevant Orders   CoaguChek XS/INR Waived   Comprehensive metabolic panel   Lipid panel   CBC with Differential/Platelet   TSH   Urinalysis, Routine w reflex microscopic     Respiratory   OSA (obstructive sleep apnea)    With worsening problems of sleep apnea and difficulty with his device will  refer back to [facility] and consultant Dr. Beatriz Chancellor      Relevant Orders   Comprehensive metabolic panel   CBC with Differential/Platelet   TSH   Urinalysis, Routine w reflex microscopic     Musculoskeletal and Integument   Lumbar degenerative disc disease    Status post surgery wound healing well.      Relevant Medications   metoprolol succinate (TOPROL-XL) 25 MG 24 hr tablet   Other Relevant Orders   Comprehensive metabolic panel     Genitourinary   BPH with obstruction/lower urinary tract symptoms   Relevant Medications   metoprolol succinate (TOPROL-XL) 25 MG 24 hr tablet   Other Relevant Orders   PSA     Other   Advanced care planning/counseling discussion    A voluntary discussion about advance care planning including the explanation and discussion of advance directives was extensively discussed  with the patient.  Explanation about the health care proxy and Living will was reviewed and packet with forms with explanation of how to fill them out was given.  Will bring copies.       Other Visit Diagnoses    Diabetes mellitus without complication (HCC)       Relevant Medications   metFORMIN (GLUCOPHAGE-XR) 500 MG 24 hr tablet   metoprolol succinate (TOPROL-XL) 25 MG 24 hr tablet   Other Relevant Orders   Comprehensive metabolic panel   Lipid panel   CBC with Differential/Platelet   TSH   Urinalysis, Routine w reflex microscopic   Essential hypertension       Relevant Medications   warfarin (COUMADIN) 4 MG tablet   warfarin (COUMADIN) 1 MG tablet   metoprolol succinate (TOPROL-XL) 25 MG 24 hr tablet   Other Relevant Orders   Comprehensive metabolic panel   Health care maintenance       Relevant Medications   metoprolol succinate (TOPROL-XL) 25 MG 24 hr tablet   Immunization due       Relevant Medications   metoprolol succinate (TOPROL-XL) 25 MG 24 hr tablet   PE (physical exam), annual       Relevant Medications   metoprolol succinate (TOPROL-XL) 25  MG 24 hr tablet       Follow up plan: Return in about 4 weeks (around 10/26/2016) for PT INR.

## 2016-09-28 NOTE — Assessment & Plan Note (Signed)
Status post surgery wound healing well.

## 2016-09-28 NOTE — Assessment & Plan Note (Signed)
With worsening problems of sleep apnea and difficulty with his device will refer back to [facility] and consultant Dr. Beatriz Chancellor

## 2016-09-28 NOTE — Addendum Note (Signed)
Addended by: Golden Pop A on: 09/28/2016 10:46 AM   Modules accepted: Orders

## 2016-09-28 NOTE — Assessment & Plan Note (Signed)
A voluntary discussion about advance care planning including the explanation and discussion of advance directives was extensively discussed  with the patient.  Explanation about the health care proxy and Living will was reviewed and packet with forms with explanation of how to fill them out was given.  Will bring copies. 

## 2016-09-29 ENCOUNTER — Other Ambulatory Visit: Payer: Medicare Other

## 2016-09-29 DIAGNOSIS — I10 Essential (primary) hypertension: Secondary | ICD-10-CM | POA: Diagnosis not present

## 2016-09-29 DIAGNOSIS — M5136 Other intervertebral disc degeneration, lumbar region: Secondary | ICD-10-CM | POA: Diagnosis not present

## 2016-09-29 DIAGNOSIS — G4733 Obstructive sleep apnea (adult) (pediatric): Secondary | ICD-10-CM | POA: Diagnosis not present

## 2016-09-29 DIAGNOSIS — N401 Enlarged prostate with lower urinary tract symptoms: Secondary | ICD-10-CM | POA: Diagnosis not present

## 2016-09-29 DIAGNOSIS — E119 Type 2 diabetes mellitus without complications: Secondary | ICD-10-CM | POA: Diagnosis not present

## 2016-09-29 DIAGNOSIS — I4891 Unspecified atrial fibrillation: Secondary | ICD-10-CM | POA: Diagnosis not present

## 2016-09-29 DIAGNOSIS — N138 Other obstructive and reflux uropathy: Secondary | ICD-10-CM | POA: Diagnosis not present

## 2016-09-29 LAB — URINALYSIS, ROUTINE W REFLEX MICROSCOPIC
Bilirubin, UA: NEGATIVE
Glucose, UA: NEGATIVE
Ketones, UA: NEGATIVE
Leukocytes, UA: NEGATIVE
Nitrite, UA: NEGATIVE
RBC, UA: NEGATIVE
Specific Gravity, UA: 1.025 (ref 1.005–1.030)
Urobilinogen, Ur: 0.2 mg/dL (ref 0.2–1.0)
pH, UA: 5.5 (ref 5.0–7.5)

## 2016-09-29 LAB — MICROSCOPIC EXAMINATION
Bacteria, UA: NONE SEEN
RBC, UA: NONE SEEN /hpf (ref 0–?)

## 2016-09-30 LAB — COMPREHENSIVE METABOLIC PANEL
ALT: 14 IU/L (ref 0–44)
AST: 17 IU/L (ref 0–40)
Albumin/Globulin Ratio: 1.6 (ref 1.2–2.2)
Albumin: 3.9 g/dL (ref 3.5–4.8)
Alkaline Phosphatase: 38 IU/L — ABNORMAL LOW (ref 39–117)
BUN/Creatinine Ratio: 15 (ref 10–24)
BUN: 17 mg/dL (ref 8–27)
Bilirubin Total: 0.4 mg/dL (ref 0.0–1.2)
CO2: 25 mmol/L (ref 20–29)
Calcium: 9.2 mg/dL (ref 8.6–10.2)
Chloride: 100 mmol/L (ref 96–106)
Creatinine, Ser: 1.12 mg/dL (ref 0.76–1.27)
GFR calc Af Amer: 74 mL/min/{1.73_m2} (ref >59)
GFR calc non Af Amer: 64 mL/min/{1.73_m2} (ref >59)
Globulin, Total: 2.5 g/dL (ref 1.5–4.5)
Glucose: 131 mg/dL — ABNORMAL HIGH (ref 65–99)
Potassium: 4.5 mmol/L (ref 3.5–5.2)
Sodium: 138 mmol/L (ref 134–144)
Total Protein: 6.4 g/dL (ref 6.0–8.5)

## 2016-09-30 LAB — LIPID PANEL
Chol/HDL Ratio: 3.2 ratio (ref 0.0–5.0)
Cholesterol, Total: 133 mg/dL (ref 100–199)
HDL: 41 mg/dL (ref >39)
LDL Calculated: 65 mg/dL (ref 0–99)
Triglycerides: 136 mg/dL (ref 0–149)
VLDL Cholesterol Cal: 27 mg/dL (ref 5–40)

## 2016-09-30 LAB — CBC WITH DIFFERENTIAL/PLATELET
Basophils Absolute: 0 10*3/uL (ref 0.0–0.2)
Basos: 0 %
EOS (ABSOLUTE): 0.1 10*3/uL (ref 0.0–0.4)
Eos: 2 %
Hematocrit: 36 % — ABNORMAL LOW (ref 37.5–51.0)
Hemoglobin: 12.1 g/dL — ABNORMAL LOW (ref 13.0–17.7)
Immature Grans (Abs): 0 10*3/uL (ref 0.0–0.1)
Immature Granulocytes: 0 %
Lymphocytes Absolute: 1.9 10*3/uL (ref 0.7–3.1)
Lymphs: 31 %
MCH: 30.9 pg (ref 26.6–33.0)
MCHC: 33.6 g/dL (ref 31.5–35.7)
MCV: 92 fL (ref 79–97)
Monocytes Absolute: 0.4 10*3/uL (ref 0.1–0.9)
Monocytes: 7 %
Neutrophils Absolute: 3.7 10*3/uL (ref 1.4–7.0)
Neutrophils: 60 %
Platelets: 264 10*3/uL (ref 150–379)
RBC: 3.92 x10E6/uL — ABNORMAL LOW (ref 4.14–5.80)
RDW: 15.1 % (ref 12.3–15.4)
WBC: 6 10*3/uL (ref 3.4–10.8)

## 2016-09-30 LAB — TSH: TSH: 5.06 u[IU]/mL — ABNORMAL HIGH (ref 0.450–4.500)

## 2016-09-30 LAB — PSA: Prostate Specific Ag, Serum: 0.7 ng/mL (ref 0.0–4.0)

## 2016-10-02 ENCOUNTER — Telehealth: Payer: Self-pay | Admitting: Family Medicine

## 2016-10-02 DIAGNOSIS — R7989 Other specified abnormal findings of blood chemistry: Secondary | ICD-10-CM

## 2016-10-02 DIAGNOSIS — D649 Anemia, unspecified: Secondary | ICD-10-CM

## 2016-10-02 NOTE — Telephone Encounter (Signed)
Phone call Discussed with patient TSH slightly elevated and CBC slightly decreased but patient just status post surgery will recheck CBC and TSH in 2 months.

## 2016-10-25 ENCOUNTER — Ambulatory Visit (INDEPENDENT_AMBULATORY_CARE_PROVIDER_SITE_OTHER): Payer: Medicare Other | Admitting: Urology

## 2016-10-25 ENCOUNTER — Encounter: Payer: Self-pay | Admitting: Urology

## 2016-10-25 VITALS — BP 118/67 | HR 65 | Ht 73.0 in | Wt 195.9 lb

## 2016-10-25 DIAGNOSIS — R350 Frequency of micturition: Secondary | ICD-10-CM | POA: Diagnosis not present

## 2016-10-25 DIAGNOSIS — R351 Nocturia: Secondary | ICD-10-CM

## 2016-10-25 MED ORDER — DESMOPRESSIN ACETATE 0.83 MCG/0.1ML NA EMUL: 1.0000 | Freq: Every day | NASAL | 2 refills | Status: DC

## 2016-10-25 NOTE — Progress Notes (Signed)
10/25/2016 3:42 PM   Sean Reyes 1940-10-17 [id number]  Referring provider: [name], MD [address] [place], [place] [address]  Chief Complaint  Patient presents with  . Urinary Frequency    HPI: [name]: The patient is being treated for low testosterone. He has a CPAP machine and nocturia. 2 weeks ago his primary care doctor stop finasteride due to lack of efficacy and he is still on Flomax.  Today The patient was on finasteride for 1 year with no benefit. He gets up 6 or 8 times a night in spite of managing his sleep apnea. His flow was good to reasonable. He does not take daily Lasix or steroids. He does not have a history of congestive heart failure. Clinically noninfected.  3 weeks ago his renal function was 64 mils per minute GFR. Serum sodium was 138     PMH: Past Medical History:  Diagnosis Date  . Anxiety   . Arthritis   . Atrial fibrillation ([place])   . Complication of anesthesia    pt reports low BP's after surgery at [facility] and difficulty awakening  . Depression   . Diabetes ([place])   . Hyperlipidemia   . Hypertension   . S/P ablation of atrial fibrillation    Ablative therapy  . Sleep apnea   . Tachycardia, unspecified     Surgical History: Past Surgical History:  Procedure Laterality Date  . ABLATION    . COLONOSCOPY WITH PROPOFOL N/A 10/05/2015   Procedure: COLONOSCOPY WITH PROPOFOL;  Surgeon: Lollie Sails, MD;  Location[facility] ENDOSCOPY;  Service: Endoscopy;  Laterality: N/A;  . HERNIA REPAIR    . JOINT REPLACEMENT Bilateral    hips  . LUMBAR LAMINECTOMY/DECOMPRESSION MICRODISCECTOMY Left 09/13/2016   Procedure: Microdiscectomy - Lumbar two-three,  Lumbar three- - left;  Surgeon: Kary Kos, MD;  Location: [place];  Service: Neurosurgery;  Laterality: Left;  . TONSILLECTOMY      Home Medications:  Allergies as of 10/25/2016      Reactions   Levaquin [levofloxacin In D5w] Anaphylaxis, Shortness Of Breath   Shellfish Allergy  Anaphylaxis   Amiodarone Other (See Comments)   Tremors and thyroid toxicity      Medication List       Accurate as of 10/25/16  3:42 PM. Always use your most recent med list.          B-complex with vitamin C tablet Take 1 tablet by mouth daily.   clomiPHENE 50 MG tablet Commonly known as:  CLOMID 1/2 tablet daily   cyclobenzaprine 10 MG tablet Commonly known as:  FLEXERIL Take 1 tablet (10 mg total) by mouth 3 (three) times daily as needed for muscle spasms.   finasteride 5 MG tablet Commonly known as:  PROSCAR Take 1 tablet (5 mg total) by mouth daily.   HYDROcodone-acetaminophen 5-325 MG tablet Commonly known as:  NORCO/VICODIN Take 1 tablet by mouth every 4 (four) hours as needed for moderate pain.   metFORMIN 500 MG 24 hr tablet Commonly known as:  GLUCOPHAGE-XR 2 tabs in AM and PM   metoprolol succinate 25 MG 24 hr tablet Commonly known as:  TOPROL-XL Take 1 tablet (25 mg total) by mouth 2 (two) times daily.   propafenone 425 MG 12 hr capsule Commonly known as:  RYTHMOL SR Take 425 mg by mouth 2 (two) times daily.   tamsulosin 0.4 MG Caps capsule Commonly known as:  FLOMAX 1 Capsule daily ORAL   warfarin 4 MG tablet Commonly  known as:  COUMADIN Take 1 tablet by mouth daily.   warfarin 1 MG tablet Commonly known as:  COUMADIN Take .5 tablet every other day along with 4 mg of warfarin            Discharge Care Instructions        Start     Ordered   10/25/16 0000  Urinalysis, Complete     10/25/16 1514      Allergies:  Allergies  Allergen Reactions  . Levaquin [Levofloxacin In D5w] Anaphylaxis and Shortness Of Breath  . Shellfish Allergy Anaphylaxis  . Amiodarone Other (See Comments)    Tremors and thyroid toxicity    Family History: Family History  Problem Relation Age of Onset  . Brain cancer Mother   . Kidney disease Neg Hx   . Prostate cancer Neg Hx   . Kidney cancer Neg Hx   . Bladder Cancer Neg Hx     Social History:   reports that he has never smoked. He has never used smokeless tobacco. He reports that he does not drink alcohol or use drugs.  ROS: UROLOGY Frequent Urination?: Yes Hard to postpone urination?: Yes Burning/pain with urination?: No Get up at night to urinate?: Yes Leakage of urine?: Yes Urine stream starts and stops?: Yes Trouble starting stream?: Yes Do you have to strain to urinate?: No Blood in urine?: No Urinary tract infection?: No Sexually transmitted disease?: No Injury to kidneys or bladder?: No Painful intercourse?: No Weak stream?: No Erection problems?: No Penile pain?: No  Gastrointestinal Nausea?: No Vomiting?: No Indigestion/heartburn?: No Diarrhea?: No Constipation?: No  Constitutional Fever: No Night sweats?: No Weight loss?: No Fatigue?: No  Skin Skin rash/lesions?: No Itching?: No  Eyes Blurred vision?: No Double vision?: No  Ears/Nose/Throat Sore throat?: No Sinus problems?: No  Hematologic/Lymphatic Swollen glands?: No Easy bruising?: No  Cardiovascular Leg swelling?: No Chest pain?: No  Respiratory Cough?: No Shortness of breath?: No  Endocrine Excessive thirst?: No  Musculoskeletal Back pain?: No Joint pain?: No  Neurological Headaches?: No Dizziness?: No  Psychologic Depression?: No Anxiety?: No  Physical Exam: BP 118/67 (BP Location: Left Arm, Patient Position: Sitting, Cuff Size: Normal)   Pulse 65   Ht 6\' 1"  (1.854 m)   Wt 195 lb 14.4 oz (88.9 kg)   BMI 25.85 kg/m   Constitutional:  Alert and oriented, No acute distress.  Laboratory Data: Lab Results  Component Value Date   WBC 6.0 09/29/2016   HGB 12.1 (L) 09/29/2016   HCT 36.0 (L) 09/29/2016   MCV 92 09/29/2016   PLT 264 09/29/2016    Lab Results  Component Value Date   CREATININE 1.12 09/29/2016    No results found for: PSA  Lab Results  Component Value Date   TESTOSTERONE 681 06/06/2016    Lab Results  Component Value Date   HGBA1C  6.5 (H) 09/07/2016    Urinalysis    Component Value Date/Time   APPEARANCEUR Clear 09/29/2016 0852   GLUCOSEU Negative 09/29/2016 0852   BILIRUBINUR Negative 09/29/2016 0852   PROTEINUR Trace (A) 09/29/2016 0852   NITRITE Negative 09/29/2016 0852   LEUKOCYTESUR Negative 09/29/2016 0852    Pertinent Imaging: none  Assessment & Plan:  The patient has nighttime frequency. The pros cons risk and black box morning associated with desmopressin no spray was discussed. Postop monitoring protocol was discussed. Reassess patient in approximately 1 month on low dose Noctiva  1. Urinary frequency 2. Nighttime frequency - Urinalysis, Complete  No Follow-up on file.  Reece Packer, MD  [facility] [address], [place], [place] [address] [phone]

## 2016-10-26 ENCOUNTER — Encounter: Payer: Self-pay | Admitting: Family Medicine

## 2016-10-26 ENCOUNTER — Ambulatory Visit (INDEPENDENT_AMBULATORY_CARE_PROVIDER_SITE_OTHER): Payer: Medicare Other | Admitting: Family Medicine

## 2016-10-26 VITALS — BP 143/88 | HR 62 | Wt 197.0 lb

## 2016-10-26 DIAGNOSIS — D649 Anemia, unspecified: Secondary | ICD-10-CM

## 2016-10-26 DIAGNOSIS — I480 Paroxysmal atrial fibrillation: Secondary | ICD-10-CM | POA: Diagnosis not present

## 2016-10-26 DIAGNOSIS — M5136 Other intervertebral disc degeneration, lumbar region: Secondary | ICD-10-CM

## 2016-10-26 DIAGNOSIS — I4891 Unspecified atrial fibrillation: Secondary | ICD-10-CM | POA: Diagnosis not present

## 2016-10-26 DIAGNOSIS — Z7901 Long term (current) use of anticoagulants: Secondary | ICD-10-CM | POA: Diagnosis not present

## 2016-10-26 DIAGNOSIS — E119 Type 2 diabetes mellitus without complications: Secondary | ICD-10-CM

## 2016-10-26 LAB — BAYER DCA HB A1C WAIVED: HB A1C (BAYER DCA - WAIVED): 6.3 % (ref <7.0)

## 2016-10-26 LAB — COAGUCHEK XS/INR WAIVED
INR: 3.4 — ABNORMAL HIGH (ref 0.9–1.1)
Prothrombin Time: 40.7 s

## 2016-10-26 NOTE — Assessment & Plan Note (Signed)
Good recovery from surgery

## 2016-10-26 NOTE — Assessment & Plan Note (Addendum)
Discuss atrial fibrillation with INR of 3.4 we'll hold dose tonight increase green slightly not change dosing as patient just out of surgery will leave alone for now and recheck INR 2 weeks.

## 2016-10-26 NOTE — Assessment & Plan Note (Signed)
Doing well with good control A1c of 6.3 today and is been good control since May.

## 2016-10-26 NOTE — Progress Notes (Signed)
BP (!) 143/88   Pulse 62   Wt 197 lb (89.4 kg)   SpO2 99%   BMI 25.99 kg/m    Subjective:    Patient ID: Sean Reyes, male    DOB: July 24, 1940, 76 y.o.   MRN: [id number]  HPI: Adonias Demore is a 76 y.o. male  Chief Complaint  Patient presents with  . Coagulation Disorder  . Weight Loss  . Neutropenia   Patient all in all doing well concerned about weight loss. Reviewed notes patient's weight is been really stable since February. From January to February lost 9 pounds intentionally. Since then has trickled up and down to a low of 195 just after surgery now 197. Patient's been more active now that is out of back surgery and appetite is returning. Concerned about neutropenia showing up on blood work CBC pending Taking warfarin without problems for atrial fibrillation doing well with no bleeding bruising issues. Relevant past medical, surgical, family and social history reviewed and updated as indicated. Interim medical history since our last visit reviewed. Allergies and medications reviewed and updated.  Review of Systems  Constitutional: Negative.   Respiratory: Negative.   Cardiovascular: Negative.     Per HPI unless specifically indicated above     Objective:    BP (!) 143/88   Pulse 62   Wt 197 lb (89.4 kg)   SpO2 99%   BMI 25.99 kg/m   Wt Readings from Last 3 Encounters:  10/26/16 197 lb (89.4 kg)  10/25/16 195 lb 14.4 oz (88.9 kg)  09/28/16 198 lb 12.8 oz (90.2 kg)    Physical Exam  Constitutional: He is oriented to person, place, and time. He appears well-developed and well-nourished.  HENT:  Head: Normocephalic and atraumatic.  Eyes: Conjunctivae and EOM are normal.  Neck: Normal range of motion.  Cardiovascular: Normal rate, regular rhythm and normal heart sounds.   Pulmonary/Chest: Effort normal and breath sounds normal.  Musculoskeletal: Normal range of motion.  Neurological: He is alert and oriented to person, place, and time.  Skin: No erythema.    Psychiatric: He has a normal mood and affect. His behavior is normal. Judgment and thought content normal.    Results for orders placed or performed in visit on 09/28/16  Microscopic Examination  Result Value Ref Range   WBC, UA 0-5 0 - 5 /hpf   RBC, UA None seen 0 - 2 /hpf   Epithelial Cells (non renal) 0-10 0 - 10 /hpf   Mucus, UA Present (A) Not Estab.   Bacteria, UA None seen None seen/Few  CoaguChek XS/INR Waived  Result Value Ref Range   INR 2.1 (H) 0.9 - 1.1   Prothrombin Time 25.5 sec  Comprehensive metabolic panel  Result Value Ref Range   Glucose 131 (H) 65 - 99 mg/dL   BUN 17 8 - 27 mg/dL   Creatinine, Ser 1.12 0.76 - 1.27 mg/dL   GFR calc non Af Amer 64 >59 mL/min/1.73   GFR calc Af Amer 74 >59 mL/min/1.73   BUN/Creatinine Ratio 15 10 - 24   Sodium 138 134 - 144 mmol/L   Potassium 4.5 3.5 - 5.2 mmol/L   Chloride 100 96 - 106 mmol/L   CO2 25 20 - 29 mmol/L   Calcium 9.2 8.6 - 10.2 mg/dL   Total Protein 6.4 6.0 - 8.5 g/dL   Albumin 3.9 3.5 - 4.8 g/dL   Globulin, Total 2.5 1.5 - 4.5 g/dL   Albumin/Globulin Ratio 1.6 1.2 - 2.2  Bilirubin Total 0.4 0.0 - 1.2 mg/dL   Alkaline Phosphatase 38 (L) 39 - 117 IU/L   AST 17 0 - 40 IU/L   ALT 14 0 - 44 IU/L  Lipid panel  Result Value Ref Range   Cholesterol, Total 133 100 - 199 mg/dL   Triglycerides 136 0 - 149 mg/dL   HDL 41 >39 mg/dL   VLDL Cholesterol Cal 27 5 - 40 mg/dL   LDL Calculated 65 0 - 99 mg/dL   Chol/HDL Ratio 3.2 0.0 - 5.0 ratio  CBC with Differential/Platelet  Result Value Ref Range   WBC 6.0 3.4 - 10.8 x10E3/uL   RBC 3.92 (L) 4.14 - 5.80 x10E6/uL   Hemoglobin 12.1 (L) 13.0 - 17.7 g/dL   Hematocrit 36.0 (L) 37.5 - 51.0 %   MCV 92 79 - 97 fL   MCH 30.9 26.6 - 33.0 pg   MCHC 33.6 31.5 - 35.7 g/dL   RDW 15.1 12.3 - 15.4 %   Platelets 264 150 - 379 x10E3/uL   Neutrophils 60 Not Estab. %   Lymphs 31 Not Estab. %   Monocytes 7 Not Estab. %   Eos 2 Not Estab. %   Basos 0 Not Estab. %   Neutrophils  Absolute 3.7 1.4 - 7.0 x10E3/uL   Lymphocytes Absolute 1.9 0.7 - 3.1 x10E3/uL   Monocytes Absolute 0.4 0.1 - 0.9 x10E3/uL   EOS (ABSOLUTE) 0.1 0.0 - 0.4 x10E3/uL   Basophils Absolute 0.0 0.0 - 0.2 x10E3/uL   Immature Granulocytes 0 Not Estab. %   Immature Grans (Abs) 0.0 0.0 - 0.1 x10E3/uL  TSH  Result Value Ref Range   TSH 5.060 (H) 0.450 - 4.500 uIU/mL  Urinalysis, Routine w reflex microscopic  Result Value Ref Range   Specific Gravity, UA 1.025 1.005 - 1.030   pH, UA 5.5 5.0 - 7.5   Color, UA Yellow Yellow   Appearance Ur Clear Clear   Leukocytes, UA Negative Negative   Protein, UA Trace (A) Negative/Trace   Glucose, UA Negative Negative   Ketones, UA Negative Negative   RBC, UA Negative Negative   Bilirubin, UA Negative Negative   Urobilinogen, Ur 0.2 0.2 - 1.0 mg/dL   Nitrite, UA Negative Negative   Microscopic Examination See below:   PSA  Result Value Ref Range   Prostate Specific Ag, Serum 0.7 0.0 - 4.0 ng/mL      Assessment & Plan:   Problem List Items Addressed This Visit      Cardiovascular and Mediastinum   Atrial fibrillation ([place])    Discuss atrial fibrillation with INR of 3.4 we'll hold dose tonight increase green slightly not change dosing as patient just out of surgery will leave alone for now and recheck INR 2 weeks.       Relevant Orders   CoaguChek XS/INR Waived   Paroxysmal atrial fibrillation ([place]) - Primary   Relevant Orders   CoaguChek XS/INR Waived     Endocrine   Diabetes mellitus without complication ([place])    Doing well with good control A1c of 6.3 today and is been good control since May.      Relevant Orders   Bayer DCA Hb A1c Waived     Musculoskeletal and Integument   Lumbar degenerative disc disease    Good recovery from surgery         Other   Chronic anticoagulation   Relevant Orders   CoaguChek XS/INR Waived   Anemia     CBC results pending  will address when labs return.  Follow up plan: Return in about 2 weeks  (around 11/09/2016).

## 2016-10-27 ENCOUNTER — Encounter: Payer: Self-pay | Admitting: Family Medicine

## 2016-10-27 LAB — CBC WITH DIFFERENTIAL/PLATELET
Basophils Absolute: 0 10*3/uL (ref 0.0–0.2)
Basos: 0 %
EOS (ABSOLUTE): 0.1 10*3/uL (ref 0.0–0.4)
Eos: 1 %
Hematocrit: 35.5 % — ABNORMAL LOW (ref 37.5–51.0)
Hemoglobin: 12.1 g/dL — ABNORMAL LOW (ref 13.0–17.7)
Immature Grans (Abs): 0 10*3/uL (ref 0.0–0.1)
Immature Granulocytes: 0 %
Lymphocytes Absolute: 1.5 10*3/uL (ref 0.7–3.1)
Lymphs: 28 %
MCH: 31.6 pg (ref 26.6–33.0)
MCHC: 34.1 g/dL (ref 31.5–35.7)
MCV: 93 fL (ref 79–97)
Monocytes Absolute: 0.6 10*3/uL (ref 0.1–0.9)
Monocytes: 11 %
Neutrophils Absolute: 3.3 10*3/uL (ref 1.4–7.0)
Neutrophils: 60 %
Platelets: 157 10*3/uL (ref 150–379)
RBC: 3.83 x10E6/uL — ABNORMAL LOW (ref 4.14–5.80)
RDW: 14.9 % (ref 12.3–15.4)
WBC: 5.5 10*3/uL (ref 3.4–10.8)

## 2016-10-31 DIAGNOSIS — H25813 Combined forms of age-related cataract, bilateral: Secondary | ICD-10-CM | POA: Diagnosis not present

## 2016-10-31 DIAGNOSIS — E119 Type 2 diabetes mellitus without complications: Secondary | ICD-10-CM | POA: Diagnosis not present

## 2016-10-31 LAB — HM DIABETES EYE EXAM

## 2016-11-06 ENCOUNTER — Ambulatory Visit: Payer: Medicare Other | Admitting: Family Medicine

## 2016-11-09 ENCOUNTER — Encounter: Payer: Self-pay | Admitting: Family Medicine

## 2016-11-09 ENCOUNTER — Ambulatory Visit (INDEPENDENT_AMBULATORY_CARE_PROVIDER_SITE_OTHER): Payer: Medicare Other | Admitting: Family Medicine

## 2016-11-09 VITALS — BP 147/85 | HR 59 | Temp 98.3°F | Resp 17 | Wt 195.4 lb

## 2016-11-09 DIAGNOSIS — N401 Enlarged prostate with lower urinary tract symptoms: Secondary | ICD-10-CM | POA: Diagnosis not present

## 2016-11-09 DIAGNOSIS — N138 Other obstructive and reflux uropathy: Secondary | ICD-10-CM | POA: Diagnosis not present

## 2016-11-09 DIAGNOSIS — I4891 Unspecified atrial fibrillation: Secondary | ICD-10-CM | POA: Diagnosis not present

## 2016-11-09 LAB — COAGUCHEK XS/INR WAIVED
INR: 4.9 — ABNORMAL HIGH (ref 0.9–1.1)
Prothrombin Time: 59.2 s

## 2016-11-09 NOTE — Patient Instructions (Signed)
Follow-up in 2 weeks

## 2016-11-09 NOTE — Assessment & Plan Note (Signed)
INR continues to increase, 4.9 today. Recommended holding today's dose and decreasing to 4 mg daily. Will recheck in 2 weeks. Call with bleeding issues in meantime.

## 2016-11-09 NOTE — Progress Notes (Addendum)
BP (!) 147/85 (BP Location: Right Arm, Patient Position: Sitting)   Pulse (!) 59   Temp 98.3 F (36.8 C) (Oral)   Resp 17   Wt 195 lb 6.4 oz (88.6 kg)   SpO2 99%   BMI 25.78 kg/m    Subjective:    Patient ID: Sean Reyes, male    DOB: [birth year], 76 y.o.   MRN: [id number]  HPI: Sean Reyes is a 76 y.o. male  Chief Complaint  Patient presents with  . Atrial Fibrillation   Patient presents for 2 week INR recheck. INR was high at 3.4 at last check. Still alternating 4 and 4.5, was instructed to hold a dose after that appt. Denies bleeding or bruising issues. Diet hasn't changed much, except notes eating much less than he used to anymore.   Relevant past medical, surgical, family and social history reviewed and updated as indicated. Interim medical history since our last visit reviewed. Allergies and medications reviewed and updated.  Review of Systems  Constitutional: Negative.   HENT: Negative.   Respiratory: Negative.   Cardiovascular: Negative.   Gastrointestinal: Negative.   Musculoskeletal: Negative.   Neurological: Negative.   Hematological: Does not bruise/bleed easily.  Psychiatric/Behavioral: Negative.    Per HPI unless specifically indicated above     Objective:    BP (!) 147/85 (BP Location: Right Arm, Patient Position: Sitting)   Pulse (!) 59   Temp 98.3 F (36.8 C) (Oral)   Resp 17   Wt 195 lb 6.4 oz (88.6 kg)   SpO2 99%   BMI 25.78 kg/m   Wt Readings from Last 3 Encounters:  11/09/16 195 lb 6.4 oz (88.6 kg)  10/26/16 197 lb (89.4 kg)  10/25/16 195 lb 14.4 oz (88.9 kg)    Physical Exam  Constitutional: He is oriented to person, place, and time. He appears well-developed and well-nourished. No distress.  Eyes: Pupils are equal, round, and reactive to light. Conjunctivae are normal.  Neck: Normal range of motion. Neck supple.  Cardiovascular: Normal rate and normal heart sounds.   Pulmonary/Chest: Effort normal and breath sounds normal.    Musculoskeletal: Normal range of motion.  Neurological: He is alert and oriented to person, place, and time.  Skin: Skin is warm and dry.  Psychiatric: He has a normal mood and affect. His behavior is normal.  Nursing note and vitals reviewed.   Results for orders placed or performed in visit on 11/09/16  CoaguChek XS/INR Waived  Result Value Ref Range   INR 4.9 (H) 0.9 - 1.1   Prothrombin Time 59.2 sec      Assessment & Plan:   Problem List Items Addressed This Visit      Cardiovascular and Mediastinum   Atrial fibrillation ([place]) - Primary    INR continues to increase, 4.9 today. Recommended holding today's dose and decreasing to 4 mg daily. Will recheck in 2 weeks. Call with bleeding issues in meantime.       Relevant Orders   CoaguChek XS/INR Waived (Completed)     Genitourinary   BPH with obstruction/lower urinary tract symptoms    Just started a new medication from Urologist, read that it can cause hyponatremia and is concerned about taking the pills. Wanting sodium checked. Reviewed that we could check at 2 week f/u as he's only taken one pill so far. Reassured that sodium had been stable in the past and to stay well hydrated.           Follow up plan:  Return in about 2 weeks (around 11/23/2016) for INR, BMP.

## 2016-11-10 ENCOUNTER — Telehealth: Payer: Self-pay | Admitting: Urology

## 2016-11-10 NOTE — Addendum Note (Signed)
Addended by: Merrie Roof E on: 11/10/2016 05:53 AM   Modules accepted: Level of Service

## 2016-11-10 NOTE — Assessment & Plan Note (Signed)
Just started a new medication from Urologist, read that it can cause hyponatremia and is concerned about taking the pills. Wanting sodium checked. Reviewed that we could check at 2 week f/u as he's only taken one pill so far. Reassured that sodium had been stable in the past and to stay well hydrated.

## 2016-11-10 NOTE — Telephone Encounter (Signed)
Received a call from the patient's insurance today asking about a prior auth that they sent on 10-26-16 for Noctiva. I checked and did not see where we received one so I asked her to refax it. Just wanted to let you know she was resending it and to watch out for it.   Thanks, Sharyn Lull

## 2016-11-13 NOTE — Telephone Encounter (Signed)
PA was completed on 9/27. Message was left on triage line that PA was APPROVED. Will make pt and [place] aware.

## 2016-11-14 ENCOUNTER — Other Ambulatory Visit: Payer: Self-pay | Admitting: Unknown Physician Specialty

## 2016-11-14 DIAGNOSIS — M5136 Other intervertebral disc degeneration, lumbar region: Secondary | ICD-10-CM

## 2016-11-14 DIAGNOSIS — Z23 Encounter for immunization: Secondary | ICD-10-CM

## 2016-11-14 DIAGNOSIS — E119 Type 2 diabetes mellitus without complications: Secondary | ICD-10-CM

## 2016-11-14 DIAGNOSIS — I10 Essential (primary) hypertension: Secondary | ICD-10-CM

## 2016-11-14 DIAGNOSIS — I4891 Unspecified atrial fibrillation: Secondary | ICD-10-CM

## 2016-11-14 DIAGNOSIS — M51369 Other intervertebral disc degeneration, lumbar region without mention of lumbar back pain or lower extremity pain: Secondary | ICD-10-CM

## 2016-11-14 DIAGNOSIS — Z0000 Encounter for general adult medical examination without abnormal findings: Secondary | ICD-10-CM

## 2016-11-14 DIAGNOSIS — N401 Enlarged prostate with lower urinary tract symptoms: Principal | ICD-10-CM

## 2016-11-14 DIAGNOSIS — N138 Other obstructive and reflux uropathy: Secondary | ICD-10-CM

## 2016-11-21 ENCOUNTER — Encounter: Payer: Self-pay | Admitting: Urology

## 2016-11-21 ENCOUNTER — Other Ambulatory Visit: Payer: Self-pay

## 2016-11-21 ENCOUNTER — Other Ambulatory Visit: Payer: Medicare Other

## 2016-11-21 DIAGNOSIS — R351 Nocturia: Secondary | ICD-10-CM

## 2016-11-28 ENCOUNTER — Ambulatory Visit: Payer: Medicare Other | Admitting: Family Medicine

## 2016-12-05 ENCOUNTER — Encounter: Payer: Self-pay | Admitting: Family Medicine

## 2016-12-05 ENCOUNTER — Ambulatory Visit (INDEPENDENT_AMBULATORY_CARE_PROVIDER_SITE_OTHER): Payer: Medicare Other | Admitting: Family Medicine

## 2016-12-05 VITALS — BP 173/89 | HR 61 | Temp 98.1°F | Wt 199.0 lb

## 2016-12-05 DIAGNOSIS — N401 Enlarged prostate with lower urinary tract symptoms: Secondary | ICD-10-CM | POA: Diagnosis not present

## 2016-12-05 DIAGNOSIS — Z23 Encounter for immunization: Secondary | ICD-10-CM | POA: Diagnosis not present

## 2016-12-05 DIAGNOSIS — I10 Essential (primary) hypertension: Secondary | ICD-10-CM | POA: Diagnosis not present

## 2016-12-05 DIAGNOSIS — N138 Other obstructive and reflux uropathy: Secondary | ICD-10-CM

## 2016-12-05 DIAGNOSIS — I480 Paroxysmal atrial fibrillation: Secondary | ICD-10-CM | POA: Diagnosis not present

## 2016-12-05 LAB — COAGUCHEK XS/INR WAIVED
INR: 3.7 — ABNORMAL HIGH (ref 0.9–1.1)
Prothrombin Time: 44.6 s

## 2016-12-05 MED ORDER — WARFARIN SODIUM 1 MG PO TABS: 1.0000 mg | ORAL_TABLET | Freq: Every day | ORAL | 1 refills | Status: DC

## 2016-12-05 MED ORDER — WARFARIN SODIUM 3 MG PO TABS: 3.0000 mg | ORAL_TABLET | Freq: Every day | ORAL | 1 refills | Status: DC

## 2016-12-05 NOTE — Progress Notes (Signed)
BP (!) 173/89   Pulse 61   Temp 98.1 F (36.7 C)   Wt 199 lb (90.3 kg)   SpO2 99%   BMI 26.25 kg/m    Subjective:    Patient ID: Sean Reyes, male    DOB: [birth year], 76 y.o.   MRN: [id number]  HPI: Sean Reyes is a 76 y.o. male  Chief Complaint  Patient presents with  . Anticoagulation    4mg  every day, but is concerned about his brusing and bleeding   Patient presents for INR recheck today. Previous INR was elevated at 4.9 so dose was decreased from 4 and 4.5 alternating to 4 mg. Pt misunderstood and continued 4 and 4.5 alternating up until last week, when he started 4 mg daily. Has had some bruising where he hit his hand on something a few weeks ago but otherwise no bleeding or bruising issues noted. Diet and medications stable.   Stopped the new urinary medicine after a week or two on it but still wouldn't mind getting his electrolytes checked to make sure they're stable.    Relevant past medical, surgical, family and social history reviewed and updated as indicated. Interim medical history since our last visit reviewed. Allergies and medications reviewed and updated.  Review of Systems  Constitutional: Negative.   Eyes: Negative.   Respiratory: Negative.   Cardiovascular: Negative.   Gastrointestinal: Negative.   Genitourinary: Negative.   Musculoskeletal: Negative.   Neurological: Negative.   Hematological: Negative.   Psychiatric/Behavioral: Negative.    Per HPI unless specifically indicated above     Objective:    BP (!) 173/89   Pulse 61   Temp 98.1 F (36.7 C)   Wt 199 lb (90.3 kg)   SpO2 99%   BMI 26.25 kg/m   Wt Readings from Last 3 Encounters:  12/05/16 199 lb (90.3 kg)  11/09/16 195 lb 6.4 oz (88.6 kg)  10/26/16 197 lb (89.4 kg)    Physical Exam  Constitutional: He is oriented to person, place, and time. He appears well-developed and well-nourished.  HENT:  Head: Atraumatic.  Eyes: Conjunctivae are normal. No scleral icterus.  Neck:  Normal range of motion. Neck supple.  Cardiovascular: Normal rate.   Pulmonary/Chest: Effort normal and breath sounds normal.  Musculoskeletal: Normal range of motion.  Neurological: He is alert and oriented to person, place, and time.  Skin: Skin is warm and dry.  Psychiatric: He has a normal mood and affect. His behavior is normal.  Nursing note and vitals reviewed.   Results for orders placed or performed in visit on 12/05/16  CoaguChek XS/INR Waived  Result Value Ref Range   INR 3.7 (H) 0.9 - 1.1   Prothrombin Time 44.6 sec  Basic metabolic panel  Result Value Ref Range   Glucose 242 (H) 65 - 99 mg/dL   BUN 13 8 - 27 mg/dL   Creatinine, Ser 1.11 0.76 - 1.27 mg/dL   GFR calc non Af Amer 65 >59 mL/min/1.73   GFR calc Af Amer 75 >59 mL/min/1.73   BUN/Creatinine Ratio 12 10 - 24   Sodium 142 134 - 144 mmol/L   Potassium 4.1 3.5 - 5.2 mmol/L   Chloride 97 96 - 106 mmol/L   CO2 25 20 - 29 mmol/L   Calcium 9.0 8.6 - 10.2 mg/dL      Assessment & Plan:   Problem List Items Addressed This Visit      Cardiovascular and Mediastinum   Paroxysmal atrial fibrillation (HCC) -  Primary    INR improving, at 3.7 today. Will start 3.5 mg and 4 mg alternating dose and recheck in 2 more weeks. F/u with any bleeding or bruising issues in meantime      Relevant Medications   warfarin (COUMADIN) 3 MG tablet   warfarin (COUMADIN) 1 MG tablet   Other Relevant Orders   CoaguChek XS/INR Waived (Completed)     Genitourinary   BPH with obstruction/lower urinary tract symptoms    Will recheck BMP today for pt reassurance      Relevant Medications   finasteride (PROSCAR) 5 MG tablet    Other Visit Diagnoses    Essential hypertension       Relevant Medications   warfarin (COUMADIN) 3 MG tablet   warfarin (COUMADIN) 1 MG tablet   Other Relevant Orders   Basic metabolic panel (Completed)   Encounter for immunization       Relevant Orders   Flu vaccine HIGH DOSE PF (Completed)   Needs  flu shot           Follow up plan: Return in about 2 weeks (around 12/19/2016) for INR.

## 2016-12-05 NOTE — Patient Instructions (Signed)
Alternate 3.5 mg and 4 mg daily

## 2016-12-06 LAB — BASIC METABOLIC PANEL
BUN/Creatinine Ratio: 12 (ref 10–24)
BUN: 13 mg/dL (ref 8–27)
CO2: 25 mmol/L (ref 20–29)
Calcium: 9 mg/dL (ref 8.6–10.2)
Chloride: 97 mmol/L (ref 96–106)
Creatinine, Ser: 1.11 mg/dL (ref 0.76–1.27)
GFR calc Af Amer: 75 mL/min/{1.73_m2} (ref >59)
GFR calc non Af Amer: 65 mL/min/{1.73_m2} (ref >59)
Glucose: 242 mg/dL — ABNORMAL HIGH (ref 65–99)
Potassium: 4.1 mmol/L (ref 3.5–5.2)
Sodium: 142 mmol/L (ref 134–144)

## 2016-12-07 NOTE — Progress Notes (Signed)
12/11/2016 9:48 AM   Sean Reyes 01-May-1940 [id number]  Referring provider: [name], MD [address] [place], [place] [address]  Chief Complaint  Patient presents with  . Follow-up    testosterone deficiency   . Benign Prostatic Hypertrophy    HPI: Patient is a 76 year old Caucasian male with testosterone deficiency, ED and BPH with LU TS who presents today for a 6 month follow up.  Testosterone deficiency Patient is experiencing a decrease in libido, lack of energy, decrease in strength and endurance, his erections being less strong, recent deterioration in his ability to play sports, fall asleep after dinner and a recent deterioration in his work performance.   This is indicated by his responses to the ADAM questionnaire.   He is no longer having spontaneous erections at night.  He has sleep apnea and is sleeping with a CPAP machine.  His pretreatment testosterone level was 198 ng/dL on 09/01/2015 and 245 ng/dL on 09/23/2015.   He is taking Clomid 50 mg, 1/2 daily for his testosterone therapy.   His current testosterone is 681 ng/dL on 06/06/2016.       Androgen Deficiency in the Aging Male    [place] Name 12/11/16 0900         Androgen Deficiency in the Aging Male   Do you have a decrease in libido (sex drive) Yes     Do you have lack of energy Yes     Do you have a decrease in strength and/or endurance Yes     Have you lost height No     Have you noticed a decreased "enjoyment of life" No     Are you sad and/or grumpy No     Are your erections less strong Yes     Have you noticed a recent deterioration in your ability to play sports Yes     Are you falling asleep after dinner Yes     Has there been a recent deterioration in your work performance Yes        Erectile dysfunction His SHIM score is 5, which is mild ED.  His previous SHIM score was 5.  He has been having difficulty with erections for several years.   His major complaint is no erections.  His libido  is diminished.   His risk factors for ED are age, BPH, hypogonadism, DM, HTN, HLD, sleep apnea, anxiety, depression, antidepressants, anticoagulants and blood pressure medications.   He denies any painful erections or curvatures with his erections.        SHIM    Row Name 12/11/16 0914         SHIM: Over the last 6 months:   How do you rate your confidence that you could get and keep an erection? Very Low     When you had erections with sexual stimulation, how often were your erections hard enough for penetration (entering your partner)? Almost Never or Never     During sexual intercourse, how often were you able to maintain your erection after you had penetrated (entered) your partner? Almost Never or Never     During sexual intercourse, how difficult was it to maintain your erection to completion of intercourse? Extremely Difficult     When you attempted sexual intercourse, how often was it satisfactory for you? Almost Never or Never       SHIM Total Score   SHIM 5        Score: 1-7 Severe ED  8-11 Moderate ED 12-16 Mild-Moderate ED 17-21 Mild ED 22-25 No ED  BPH WITH LUTS His IPSS score today is 29, which is severs lower urinary tract symptomatology.  He is mostly dissatisfied with his quality life due to his urinary symptoms.  His PVR is 40 mL.  His previous IPSS score was 20/3.  His previous was 16 mL    His major complaints today are frequency, urgency, incontinence, intermittency, hesitancy and nocturia x 3-4.  He has had these symptoms for several years.  He is sleeping with his CPAP machine.  He did try the Noctiva, but he experienced increase heart rate.    He denies any dysuria, hematuria or suprapubic pain.  He currently taking finasteride 5 mg daily.  He discontinued the tamsulosin due to soft stools.  He also denies any recent fevers, chills, nausea or vomiting.   He does not have a family history of PCa.      IPSS    Row Name 12/11/16 0900         International  Prostate Symptom Score   How often have you had the sensation of not emptying your bladder? Almost always     How often have you had to urinate less than every two hours? About half the time     How often have you found you stopped and started again several times when you urinated? Almost always     How often have you found it difficult to postpone urination? More than half the time     How often have you had a weak urinary stream? Almost always     How often have you had to strain to start urination? About half the time     How many times did you typically get up at night to urinate? 4 Times     Total IPSS Score 29       Quality of Life due to urinary symptoms   If you were to spend the rest of your life with your urinary condition just the way it is now how would you feel about that? Mostly Disatisfied        Score:  1-7 Mild 8-19 Moderate 20-35 Severe    PMH: Past Medical History:  Diagnosis Date  . Anxiety   . Arthritis   . Atrial fibrillation ([place])   . Complication of anesthesia    pt reports low BP's after surgery at [facility] and difficulty awakening  . Depression   . Diabetes ([place])   . Hyperlipidemia   . Hypertension   . S/P ablation of atrial fibrillation    Ablative therapy  . Sleep apnea   . Tachycardia, unspecified     Surgical History: Past Surgical History:  Procedure Laterality Date  . ABLATION    . COLONOSCOPY WITH PROPOFOL N/A 10/05/2015   Procedure: COLONOSCOPY WITH PROPOFOL;  Surgeon: Lollie Sails, MD;  Location[facility] ENDOSCOPY;  Service: Endoscopy;  Laterality: N/A;  . HERNIA REPAIR    . JOINT REPLACEMENT Bilateral    hips  . LUMBAR LAMINECTOMY/DECOMPRESSION MICRODISCECTOMY Left 09/13/2016   Procedure: Microdiscectomy - Lumbar two-three,  Lumbar three- - left;  Surgeon: Kary Kos, MD;  Location: [place];  Service: Neurosurgery;  Laterality: Left;  . TONSILLECTOMY      Home Medications:  Allergies as of 12/11/2016      Reactions    Levaquin [levofloxacin In D5w] Anaphylaxis, Shortness Of Breath   Shellfish Allergy Anaphylaxis   Amiodarone Other (See Comments)  Tremors and thyroid toxicity      Medication List       Accurate as of 12/11/16  9:48 AM. Always use your most recent med list.          B-complex with vitamin C tablet Take 1 tablet by mouth daily.   clomiPHENE 50 MG tablet Commonly known as:  CLOMID 1/2 tablet daily   finasteride 5 MG tablet Commonly known as:  PROSCAR Take 1 tablet (5 mg total) by mouth daily.   metFORMIN 500 MG 24 hr tablet Commonly known as:  GLUCOPHAGE-XR 2 tabs in AM and PM   metoprolol succinate 25 MG 24 hr tablet Commonly known as:  TOPROL-XL Take 1 tablet (25 mg total) by mouth 2 (two) times daily.   propafenone 425 MG 12 hr capsule Commonly known as:  RYTHMOL SR Take 425 mg by mouth 2 (two) times daily.   warfarin 4 MG tablet Commonly known as:  COUMADIN Take 1 tablet by mouth daily.   warfarin 3 MG tablet Commonly known as:  COUMADIN Take 1 tablet (3 mg total) by mouth daily. Alternate 3.5 mg and 4 mg daily   warfarin 1 MG tablet Commonly known as:  COUMADIN Take 1 tablet (1 mg total) by mouth daily. Alternate 3.5 mg and 4 mg daily       Allergies:  Allergies  Allergen Reactions  . Levaquin [Levofloxacin In D5w] Anaphylaxis and Shortness Of Breath  . Shellfish Allergy Anaphylaxis  . Amiodarone Other (See Comments)    Tremors and thyroid toxicity    Family History: Family History  Problem Relation Age of Onset  . Brain cancer Mother   . Kidney disease Neg Hx   . Prostate cancer Neg Hx   . Kidney cancer Neg Hx   . Bladder Cancer Neg Hx     Social History:  reports that he has never smoked. He has never used smokeless tobacco. He reports that he does not drink alcohol or use drugs.  ROS: UROLOGY Frequent Urination?: Yes Hard to postpone urination?: Yes Burning/pain with urination?: No Get up at night to urinate?: Yes Leakage of  urine?: Yes Urine stream starts and stops?: Yes Trouble starting stream?: Yes Do you have to strain to urinate?: No Blood in urine?: No Urinary tract infection?: No Sexually transmitted disease?: No Injury to kidneys or bladder?: No Painful intercourse?: No Weak stream?: No Erection problems?: No Penile pain?: No  Gastrointestinal Nausea?: No Vomiting?: No Indigestion/heartburn?: No Diarrhea?: No Constipation?: No  Constitutional Fever: No Night sweats?: No Weight loss?: No Fatigue?: No  Skin Skin rash/lesions?: No Itching?: No  Eyes Blurred vision?: No Double vision?: No  Ears/Nose/Throat Sore throat?: No Sinus problems?: No  Hematologic/Lymphatic Swollen glands?: No Easy bruising?: No  Cardiovascular Leg swelling?: No Chest pain?: No  Respiratory Cough?: No Shortness of breath?: No  Endocrine Excessive thirst?: No  Musculoskeletal Back pain?: No Joint pain?: No  Neurological Headaches?: No Dizziness?: No  Psychologic Depression?: No Anxiety?: No  Physical Exam: BP (!) 155/78   Pulse 63   Ht 6\' 1"  (1.854 m)   Wt 196 lb (88.9 kg)   BMI 25.86 kg/m   Constitutional: Well nourished. Alert and oriented, No acute distress. HEENT: [place] AT, moist mucus membranes. Trachea midline, no masses. Cardiovascular: No clubbing, cyanosis, or edema. Respiratory: Normal respiratory effort, no increased work of breathing. GI: Abdomen is soft, non tender, non distended, no abdominal masses. Liver and spleen not palpable.  No hernias appreciated.  Stool sample  for occult testing is not indicated.   GU: No CVA tenderness.  No bladder fullness or masses.  Patient with circumcised phallus.  Vitiligo noted.   Urethral meatus is patent.  No penile discharge. No penile lesions or rashes. Scrotum without lesions, cysts, rashes and/or edema.  Testicles are located scrotally bilaterally. No masses are appreciated in the testicles. Left and right epididymis are  normal. Rectal: Patient with  normal sphincter tone. Anus and perineum without scarring or rashes. No rectal masses are appreciated. Prostate is approximately 50 grams, no nodules are appreciated. Seminal vesicles are normal. Skin: No rashes, bruises or suspicious lesions. Lymph: No cervical or inguinal adenopathy. Neurologic: Grossly intact, no focal deficits, moving all 4 extremities. Psychiatric: Normal mood and affect.  Laboratory Data: Lab Results  Component Value Date   WBC 5.5 10/26/2016   HGB 12.1 (L) 10/26/2016   HCT 35.5 (L) 10/26/2016   MCV 93 10/26/2016   PLT 157 10/26/2016    Lab Results  Component Value Date   CREATININE 1.11 12/05/2016   PSA History  0.9 ng/mL on 06/02/2015  0.6 ng/mL on 06/06/2016  0.7 ng/mL on 09/29/2016     Lab Results  Component Value Date   TESTOSTERONE 681 06/06/2016     Lab Results  Component Value Date   TSH 5.060 (H) 09/29/2016       Component Value Date/Time   CHOL 133 09/29/2016 0852   HDL 41 09/29/2016 0852   CHOLHDL 3.2 09/29/2016 0852   LDLCALC 65 09/29/2016 0852    Lab Results  Component Value Date   AST 17 09/29/2016   Lab Results  Component Value Date   ALT 14 09/29/2016   I have reviewed the labs.  Assessment & Plan:    1. Testosterone deficiency   -most recent testosterone level is 681 ng/dL on 06/06/2016  (goal 450-600 ng/dL)  -continue Clomid 50 mg, 1/2 tablet daily; refills given  - testosterone, HBG, HCT and LFT's are drawn today - if stable RTC in one year  -RTC in 6 months for HCT, Hbg, testosterone, PSA, LFT's, ADAM and exam  2. BPH with LUTS  - IPSS score is 29/4, it is improving  - Continue conservative management, avoiding bladder irritants and timed voiding's  - Most bothersome symptoms are nocturia and urinary hesitancy  - Continue finasteride 5 mg daily  - schedule a cystoscopy to evaluate for BOO - even though PVR is minimal - patient is having significant obstructive symptoms  (hesitancy, intermittency and dribbling) - I also heard him in the bathroom and his stream was weak   3. Erectile dysfunction:     -SHIM score is 5  -wife is not interested  -RTC in 6 months for SHIM score and exam, as testosterone therapy can affect erections  4. Nocturia  - sleeping with a CPAP machine  - He is trying to restriction fluid after 6:00 in the evening and voiding just prior to bedtime.  -  I also informed the patient that a recent study noted that decreasing sodium intake to 2.3 grams daily, if they don't have issues with hyponatremia, can also reduce the number of nightly voids  - failed Noctiva   - Cystoscopic be pending to see if BOO is contributing to his nocturia  Return for Schedule cystoscopically to evaluate for BOO.  These notes generated with voice recognition software. I apologize for typographical errors.  Zara Council, [facility] [address] [place], [place] [address] [phone]  [phone]  

## 2016-12-07 NOTE — Assessment & Plan Note (Addendum)
INR improving, at 3.7 today. Will start 3.5 mg and 4 mg alternating dose and recheck in 2 more weeks. F/u with any bleeding or bruising issues in meantime

## 2016-12-07 NOTE — Assessment & Plan Note (Signed)
Will recheck BMP today for pt reassurance

## 2016-12-08 ENCOUNTER — Other Ambulatory Visit: Payer: Medicare Other

## 2016-12-08 ENCOUNTER — Other Ambulatory Visit: Payer: Self-pay | Admitting: *Deleted

## 2016-12-08 DIAGNOSIS — E349 Endocrine disorder, unspecified: Secondary | ICD-10-CM

## 2016-12-08 DIAGNOSIS — N400 Enlarged prostate without lower urinary tract symptoms: Secondary | ICD-10-CM

## 2016-12-11 ENCOUNTER — Encounter: Payer: Self-pay | Admitting: Urology

## 2016-12-11 ENCOUNTER — Ambulatory Visit (INDEPENDENT_AMBULATORY_CARE_PROVIDER_SITE_OTHER): Payer: Medicare Other | Admitting: Urology

## 2016-12-11 VITALS — BP 155/78 | HR 63 | Ht 73.0 in | Wt 196.0 lb

## 2016-12-11 DIAGNOSIS — N529 Male erectile dysfunction, unspecified: Secondary | ICD-10-CM

## 2016-12-11 DIAGNOSIS — E349 Endocrine disorder, unspecified: Secondary | ICD-10-CM

## 2016-12-11 DIAGNOSIS — E118 Type 2 diabetes mellitus with unspecified complications: Secondary | ICD-10-CM | POA: Diagnosis not present

## 2016-12-11 DIAGNOSIS — N138 Other obstructive and reflux uropathy: Secondary | ICD-10-CM | POA: Diagnosis not present

## 2016-12-11 DIAGNOSIS — E291 Testicular hypofunction: Secondary | ICD-10-CM | POA: Diagnosis not present

## 2016-12-11 DIAGNOSIS — E669 Obesity, unspecified: Secondary | ICD-10-CM | POA: Diagnosis not present

## 2016-12-11 DIAGNOSIS — I10 Essential (primary) hypertension: Secondary | ICD-10-CM | POA: Diagnosis not present

## 2016-12-11 DIAGNOSIS — I480 Paroxysmal atrial fibrillation: Secondary | ICD-10-CM | POA: Diagnosis not present

## 2016-12-11 DIAGNOSIS — N401 Enlarged prostate with lower urinary tract symptoms: Secondary | ICD-10-CM | POA: Diagnosis not present

## 2016-12-11 DIAGNOSIS — R351 Nocturia: Secondary | ICD-10-CM | POA: Diagnosis not present

## 2016-12-11 DIAGNOSIS — G4733 Obstructive sleep apnea (adult) (pediatric): Secondary | ICD-10-CM | POA: Diagnosis not present

## 2016-12-11 LAB — BLADDER SCAN AMB NON-IMAGING: Scan Result: 40

## 2016-12-11 MED ORDER — CLOMIPHENE CITRATE 50 MG PO TABS
ORAL_TABLET | ORAL | 3 refills | Status: DC
Start: 2016-12-11 — End: 2017-07-02

## 2016-12-11 MED ORDER — FINASTERIDE 5 MG PO TABS: 5.0000 mg | ORAL_TABLET | Freq: Every day | ORAL | 3 refills | Status: DC

## 2016-12-11 NOTE — Patient Instructions (Addendum)
DASH Eating Plan DASH stands for "Dietary Approaches to Stop Hypertension." The DASH eating plan is a healthy eating plan that has been shown to reduce high blood pressure (hypertension). It may also reduce your risk for type 2 diabetes, heart disease, and stroke. The DASH eating plan may also help with weight loss. What are tips for following this plan? General guidelines  Avoid eating more than 2,300 mg (milligrams) of salt (sodium) a day. If you have hypertension, you may need to reduce your sodium intake to 1,500 mg a day.  Limit alcohol intake to no more than 1 drink a day for nonpregnant women and 2 drinks a day for men. One drink equals 12 oz of beer, 5 oz of wine, or 1 oz of hard liquor.  Work with your health care provider to maintain a healthy body weight or to lose weight. Ask what an ideal weight is for you.  Get at least 30 minutes of exercise that causes your heart to beat faster (aerobic exercise) most days of the week. Activities may include walking, swimming, or biking.  Work with your health care provider or diet and nutrition specialist (dietitian) to adjust your eating plan to your individual calorie needs. Reading food labels  Check food labels for the amount of sodium per serving. Choose foods with less than 5 percent of the Daily Value of sodium. Generally, foods with less than 300 mg of sodium per serving fit into this eating plan.  To find whole grains, look for the word "whole" as the first word in the ingredient list. Shopping  Buy products labeled as "low-sodium" or "no salt added."  Buy fresh foods. Avoid canned foods and premade or frozen meals. Cooking  Avoid adding salt when cooking. Use salt-free seasonings or herbs instead of table salt or sea salt. Check with your health care provider or pharmacist before using salt substitutes.  Do not fry foods. Cook foods using healthy methods such as baking, boiling, grilling, and broiling instead.  Cook with  heart-healthy oils, such as olive, canola, soybean, or sunflower oil. Meal planning   Eat a balanced diet that includes: ? 5 or more servings of fruits and vegetables each day. At each meal, try to fill half of your plate with fruits and vegetables. ? Up to 6-8 servings of whole grains each day. ? Less than 6 oz of lean meat, poultry, or fish each day. A 3-oz serving of meat is about the same size as a deck of cards. One egg equals 1 oz. ? 2 servings of low-fat dairy each day. ? A serving of nuts, seeds, or beans 5 times each week. ? Heart-healthy fats. Healthy fats called Omega-3 fatty acids are found in foods such as flaxseeds and coldwater fish, like sardines, salmon, and mackerel.  Limit how much you eat of the following: ? Canned or prepackaged foods. ? Food that is high in trans fat, such as fried foods. ? Food that is high in saturated fat, such as fatty meat. ? Sweets, desserts, sugary drinks, and other foods with added sugar. ? Full-fat dairy products.  Do not salt foods before eating.  Try to eat at least 2 vegetarian meals each week.  Eat more home-cooked food and less restaurant, buffet, and fast food.  When eating at a restaurant, ask that your food be prepared with less salt or no salt, if possible. What foods are recommended? The items listed may not be a complete list. Talk with your dietitian about what   dietary choices are best for you. Grains Whole-grain or whole-wheat bread. Whole-grain or whole-wheat pasta. Brown rice. Oatmeal. Quinoa. Bulgur. Whole-grain and low-sodium cereals. Pita bread. Low-fat, low-sodium crackers. Whole-wheat flour tortillas. Vegetables Fresh or frozen vegetables (raw, steamed, roasted, or grilled). Low-sodium or reduced-sodium tomato and vegetable juice. Low-sodium or reduced-sodium tomato sauce and tomato paste. Low-sodium or reduced-sodium canned vegetables. Fruits All fresh, dried, or frozen fruit. Canned fruit in natural juice (without  added sugar). Meat and other protein foods Skinless chicken or turkey. Ground chicken or turkey. Pork with fat trimmed off. Fish and seafood. Egg whites. Dried beans, peas, or lentils. Unsalted nuts, nut butters, and seeds. Unsalted canned beans. Lean cuts of beef with fat trimmed off. Low-sodium, lean deli meat. Dairy Low-fat (1%) or fat-free (skim) milk. Fat-free, low-fat, or reduced-fat cheeses. Nonfat, low-sodium ricotta or cottage cheese. Low-fat or nonfat yogurt. Low-fat, low-sodium cheese. Fats and oils Soft margarine without trans fats. Vegetable oil. Low-fat, reduced-fat, or light mayonnaise and salad dressings (reduced-sodium). Canola, safflower, olive, soybean, and sunflower oils. Avocado. Seasoning and other foods Herbs. Spices. Seasoning mixes without salt. Unsalted popcorn and pretzels. Fat-free sweets. What foods are not recommended? The items listed may not be a complete list. Talk with your dietitian about what dietary choices are best for you. Grains Baked goods made with fat, such as croissants, muffins, or some breads. Dry pasta or rice meal packs. Vegetables Creamed or fried vegetables. Vegetables in a cheese sauce. Regular canned vegetables (not low-sodium or reduced-sodium). Regular canned tomato sauce and paste (not low-sodium or reduced-sodium). Regular tomato and vegetable juice (not low-sodium or reduced-sodium). Pickles. Olives. Fruits Canned fruit in a light or heavy syrup. Fried fruit. Fruit in cream or butter sauce. Meat and other protein foods Fatty cuts of meat. Ribs. Fried meat. Bacon. Sausage. Bologna and other processed lunch meats. Salami. Fatback. Hotdogs. Bratwurst. Salted nuts and seeds. Canned beans with added salt. Canned or smoked fish. Whole eggs or egg yolks. Chicken or turkey with skin. Dairy Whole or 2% milk, cream, and half-and-half. Whole or full-fat cream cheese. Whole-fat or sweetened yogurt. Full-fat cheese. Nondairy creamers. Whipped toppings.  Processed cheese and cheese spreads. Fats and oils Butter. Stick margarine. Lard. Shortening. Ghee. Bacon fat. Tropical oils, such as coconut, palm kernel, or palm oil. Seasoning and other foods Salted popcorn and pretzels. Onion salt, garlic salt, seasoned salt, table salt, and sea salt. Worcestershire sauce. Tartar sauce. Barbecue sauce. Teriyaki sauce. Soy sauce, including reduced-sodium. Steak sauce. Canned and packaged gravies. Fish sauce. Oyster sauce. Cocktail sauce. Horseradish that you find on the shelf. Ketchup. Mustard. Meat flavorings and tenderizers. Bouillon cubes. Hot sauce and Tabasco sauce. Premade or packaged marinades. Premade or packaged taco seasonings. Relishes. Regular salad dressings. Where to find more information:  National Heart, Lung, and Blood Institute: www.nhlbi.nih.gov  American Heart Association: www.heart.org Summary  The DASH eating plan is a healthy eating plan that has been shown to reduce high blood pressure (hypertension). It may also reduce your risk for type 2 diabetes, heart disease, and stroke.  With the DASH eating plan, you should limit salt (sodium) intake to 2,300 mg a day. If you have hypertension, you may need to reduce your sodium intake to 1,500 mg a day.  When on the DASH eating plan, aim to eat more fresh fruits and vegetables, whole grains, lean proteins, low-fat dairy, and heart-healthy fats.  Work with your health care provider or diet and nutrition specialist (dietitian) to adjust your eating plan to your individual   calorie needs. This information is not intended to replace advice given to you by your health care provider. Make sure you discuss any questions you have with your health care provider. Document Released: 01/19/2011 Document Revised: 01/24/2016 Document Reviewed: 01/24/2016 Elsevier Interactive Patient Education  2017 [employer]  

## 2016-12-12 ENCOUNTER — Telehealth: Payer: Self-pay

## 2016-12-12 LAB — TESTOSTERONE: Testosterone: 642 ng/dL (ref 264–916)

## 2016-12-12 LAB — HEPATIC FUNCTION PANEL
ALT: 20 IU/L (ref 0–44)
AST: 20 IU/L (ref 0–40)
Albumin: 4.2 g/dL (ref 3.5–4.8)
Alkaline Phosphatase: 42 IU/L (ref 39–117)
Bilirubin Total: 0.3 mg/dL (ref 0.0–1.2)
Bilirubin, Direct: 0.08 mg/dL (ref 0.00–0.40)
Total Protein: 6.7 g/dL (ref 6.0–8.5)

## 2016-12-12 LAB — HEMOGLOBIN: Hemoglobin: 13.1 g/dL (ref 13.0–17.7)

## 2016-12-12 LAB — HEMATOCRIT: Hematocrit: 40.6 % (ref 37.5–51.0)

## 2016-12-12 NOTE — Telephone Encounter (Signed)
Spoke to patient. Gave lab results. Patient verbalized understanding.  

## 2016-12-12 NOTE — Telephone Encounter (Signed)
-----   Message from Nori Riis, PA-C sent at 12/12/2016 11:54 AM EDT ----- Please let Mr. Ratterman know that his lab work was normal.  His testosterone level is at 642 which is great.

## 2016-12-13 ENCOUNTER — Telehealth: Payer: Self-pay

## 2016-12-13 NOTE — Telephone Encounter (Signed)
See previous note

## 2016-12-19 ENCOUNTER — Ambulatory Visit: Payer: Medicare Other | Admitting: Family Medicine

## 2016-12-19 DIAGNOSIS — I739 Peripheral vascular disease, unspecified: Secondary | ICD-10-CM | POA: Diagnosis not present

## 2016-12-19 DIAGNOSIS — M5440 Lumbago with sciatica, unspecified side: Secondary | ICD-10-CM | POA: Diagnosis not present

## 2016-12-20 ENCOUNTER — Encounter: Payer: Self-pay | Admitting: Family Medicine

## 2016-12-20 ENCOUNTER — Ambulatory Visit (INDEPENDENT_AMBULATORY_CARE_PROVIDER_SITE_OTHER): Payer: Medicare Other | Admitting: Family Medicine

## 2016-12-20 VITALS — BP 144/82 | HR 60 | Wt 197.0 lb

## 2016-12-20 DIAGNOSIS — I4891 Unspecified atrial fibrillation: Secondary | ICD-10-CM | POA: Diagnosis not present

## 2016-12-20 NOTE — Progress Notes (Signed)
   BP (!) 144/82   Pulse 60   Wt 197 lb (89.4 kg)   SpO2 100%   BMI 25.99 kg/m    Subjective:    Patient ID: Sean Reyes, male    DOB: 07/23/1940, 76 y.o.   MRN: [id number]  HPI: Sean Reyes is a 76 y.o. male  Chief Complaint  Patient presents with  . Coagulation Disorder   Patient presents today for INR follow up. Currently taking 3.5 and 4 mg alternating. Previous INR 2 weeks ago was 3.7. No bleeding or bruising issues in interim. No medication changes or diet changes. Feeling well overall. Denies CP, SOB, palpitations.   Relevant past medical, surgical, family and social history reviewed and updated as indicated. Interim medical history since our last visit reviewed. Allergies and medications reviewed and updated.  Review of Systems  Constitutional: Negative.   HENT: Negative.   Respiratory: Negative.   Cardiovascular: Negative.   Gastrointestinal: Negative.   Genitourinary: Negative.   Musculoskeletal: Negative.   Neurological: Negative.   Hematological: Does not bruise/bleed easily.  Psychiatric/Behavioral: Negative.     Per HPI unless specifically indicated above     Objective:    BP (!) 144/82   Pulse 60   Wt 197 lb (89.4 kg)   SpO2 100%   BMI 25.99 kg/m   Wt Readings from Last 3 Encounters:  12/20/16 197 lb (89.4 kg)  12/11/16 196 lb (88.9 kg)  12/05/16 199 lb (90.3 kg)    Physical Exam  Constitutional: He is oriented to person, place, and time. He appears well-developed and well-nourished. No distress.  HENT:  Head: Atraumatic.  Eyes: Conjunctivae are normal. Pupils are equal, round, and reactive to light.  Neck: Normal range of motion. Neck supple.  Cardiovascular: Normal rate.  Pulmonary/Chest: Effort normal and breath sounds normal. No respiratory distress.  Musculoskeletal: Normal range of motion.  Neurological: He is alert and oriented to person, place, and time.  Skin: Skin is warm and dry.  Psychiatric: He has a normal mood and affect.  His behavior is normal.  Nursing note and vitals reviewed.  Results for orders placed or performed in visit on 12/20/16  INR/PT  Result Value Ref Range   INR 2.6 (H) 0.8 - 1.2   Prothrombin Time 25.4 (H) 9.1 - 12.0 sec      Assessment & Plan:   Problem List Items Addressed This Visit      Cardiovascular and Mediastinum   Atrial fibrillation ([place]) - Primary    INR stable at 2.6 today. Continue current regimen of 3.5 mg and 4 mg coumadin alternating      Relevant Orders   INR/PT (Completed)       Follow up plan: Return in about 4 weeks (around 01/17/2017) for INR.

## 2016-12-21 ENCOUNTER — Other Ambulatory Visit (INDEPENDENT_AMBULATORY_CARE_PROVIDER_SITE_OTHER): Payer: Self-pay | Admitting: Sports Medicine

## 2016-12-21 ENCOUNTER — Ambulatory Visit (INDEPENDENT_AMBULATORY_CARE_PROVIDER_SITE_OTHER): Payer: Medicare Other

## 2016-12-21 DIAGNOSIS — I739 Peripheral vascular disease, unspecified: Secondary | ICD-10-CM

## 2016-12-21 LAB — PROTIME-INR
INR: 2.6 — ABNORMAL HIGH (ref 0.8–1.2)
Prothrombin Time: 25.4 s — ABNORMAL HIGH (ref 9.1–12.0)

## 2016-12-22 NOTE — Assessment & Plan Note (Signed)
INR stable at 2.6 today. Continue current regimen of 3.5 mg and 4 mg coumadin alternating

## 2016-12-22 NOTE — Patient Instructions (Signed)
Follow up in 1 month   

## 2016-12-25 ENCOUNTER — Telehealth: Payer: Self-pay | Admitting: Family Medicine

## 2016-12-25 NOTE — Telephone Encounter (Signed)
INR back to normal at 2.6, had sent results via Mychart but I see now my result note didn't attach just the lab value. He can continue his current coumadin regimen.   Copied from [place] [id number]. Topic: Quick Communication - Lab Results >> Dec 25, 2016 12:56 PM Patrice Paradise wrote: Pt would like a call back @ [phone] about his INR/PT that was done on 12/20/2016. Patient stated it's ok to leave vm.

## 2016-12-25 NOTE — Telephone Encounter (Signed)
Patient notified

## 2017-01-01 ENCOUNTER — Other Ambulatory Visit (INDEPENDENT_AMBULATORY_CARE_PROVIDER_SITE_OTHER): Payer: Self-pay | Admitting: Neurosurgery

## 2017-01-08 ENCOUNTER — Ambulatory Visit (INDEPENDENT_AMBULATORY_CARE_PROVIDER_SITE_OTHER): Payer: Medicare Other | Admitting: Urology

## 2017-01-08 VITALS — BP 151/79 | HR 58 | Ht 73.0 in | Wt 196.6 lb

## 2017-01-08 DIAGNOSIS — N138 Other obstructive and reflux uropathy: Secondary | ICD-10-CM

## 2017-01-08 DIAGNOSIS — N401 Enlarged prostate with lower urinary tract symptoms: Secondary | ICD-10-CM | POA: Diagnosis not present

## 2017-01-08 LAB — URINALYSIS, COMPLETE
Bilirubin, UA: NEGATIVE
Leukocytes, UA: NEGATIVE
Nitrite, UA: NEGATIVE
RBC, UA: NEGATIVE
Specific Gravity, UA: 1.03 (ref 1.005–1.030)
Urobilinogen, Ur: 0.2 mg/dL (ref 0.2–1.0)
pH, UA: 5.5 (ref 5.0–7.5)

## 2017-01-08 LAB — MICROSCOPIC EXAMINATION

## 2017-01-08 MED ORDER — LIDOCAINE HCL 2 % EX GEL
1.0000 "application " | Freq: Once | CUTANEOUS | Status: AC
  Administered 2017-01-08: 1 via URETHRAL

## 2017-01-08 MED ORDER — CIPROFLOXACIN HCL 500 MG PO TABS: 500.0000 mg | ORAL_TABLET | Freq: Once | ORAL | Status: DC

## 2017-01-08 MED ORDER — DOXYCYCLINE HYCLATE 100 MG PO TABS
100.0000 mg | ORAL_TABLET | Freq: Once | ORAL | Status: AC
  Administered 2017-01-08: 100 mg via ORAL

## 2017-01-08 MED ORDER — MIRABEGRON ER 25 MG PO TB24: 25.0000 mg | ORAL_TABLET | Freq: Every day | ORAL | 11 refills | Status: DC

## 2017-01-08 NOTE — Progress Notes (Signed)
01/08/2017 2:11 PM   Jules Schick Aug 07, 1940 [id number]  Referring provider: [name], MD [address] [place], [place] [address]  Chief Complaint  Patient presents with  . Cysto    HPI: [name]: The patient is being treated for low testosterone. He has a CPAP machine and nocturia. 2 weeks ago his primary care doctor stop finasteride due to lack of efficacy and he is still on Flomax.  Today The patient was on finasteride for 1 year with no benefit. He gets up 6 or 8 times a night in spite of managing his sleep apnea. His flow was good to reasonable. He does not take daily Lasix or steroids. He does not have a history of congestive heart failure. Clinically noninfected.  3 weeks ago his renal function was 64 mils per minute GFR. Serum sodium was 138  Today Frequency and nocturia are stable.  He failed desmopressin nose spray r.  [name] wanted him cystoscoped for bladder outlet obstructive symptoms and decreased flow.  On requestioning the patient has a slow flow and sometimes he hesitates.  He does feel empty but can double void 15 minutes later.  On a good night he gets up 3 or 4 times and other times more frequently.  He has never smoked  Cystoscopy: After written consent the patient underwent sterile flexible cystoscopy.  The penile and bulbar urethra were normal.  His prostatic urethra was approximately 2.5 cm in length.  He had bilobar enlargement of the prostate.  He had a few friable blood vessels.  He had a grade 1 out of 4 bladder trabeculation.  He had no cystitis or carcinoma.  He tolerated the procedure well     PMH: Past Medical History:  Diagnosis Date  . Anxiety   . Arthritis   . Atrial fibrillation ([place])   . Complication of anesthesia    pt reports low BP's after surgery at [facility] and difficulty awakening  . Depression   . Diabetes ([place])   . Hyperlipidemia   . Hypertension   . S/P ablation of atrial fibrillation    Ablative therapy  .  Sleep apnea   . Tachycardia, unspecified     Surgical History: Past Surgical History:  Procedure Laterality Date  . ABLATION    . COLONOSCOPY WITH PROPOFOL N/A 10/05/2015   Procedure: COLONOSCOPY WITH PROPOFOL;  Surgeon: Lollie Sails, MD;  Location: [facility] ENDOSCOPY;  Service: Endoscopy;  Laterality: N/A;  . HERNIA REPAIR    . JOINT REPLACEMENT Bilateral    hips  . LUMBAR LAMINECTOMY/DECOMPRESSION MICRODISCECTOMY Left 09/13/2016   Procedure: Microdiscectomy - Lumbar two-three,  Lumbar three- - left;  Surgeon: Kary Kos, MD;  Location: [place];  Service: Neurosurgery;  Laterality: Left;  . TONSILLECTOMY      Home Medications:  Allergies as of 01/08/2017      Reactions   Levaquin [levofloxacin In D5w] Anaphylaxis, Shortness Of Breath   Shellfish Allergy Anaphylaxis   Amiodarone Other (See Comments)   Tremors and thyroid toxicity      Medication List        Accurate as of 01/08/17  2:11 PM. Always use your most recent med list.          B-complex with vitamin C tablet Take 1 tablet by mouth daily.   clomiPHENE 50 MG tablet Commonly known as:  CLOMID 1/2 tablet daily   finasteride 5 MG tablet Commonly known as:  PROSCAR Take 1 tablet (5 mg total) by mouth daily.  metFORMIN 500 MG 24 hr tablet Commonly known as:  GLUCOPHAGE-XR 2 tabs in AM and PM   metoprolol succinate 25 MG 24 hr tablet Commonly known as:  TOPROL-XL Take 1 tablet (25 mg total) by mouth 2 (two) times daily.   propafenone 425 MG 12 hr capsule Commonly known as:  RYTHMOL SR Take 425 mg by mouth 2 (two) times daily.   warfarin 4 MG tablet Commonly known as:  COUMADIN Take 1 tablet by mouth daily.   warfarin 3 MG tablet Commonly known as:  COUMADIN Take 1 tablet (3 mg total) by mouth daily. Alternate 3.5 mg and 4 mg daily   warfarin 1 MG tablet Commonly known as:  COUMADIN Take 1 tablet (1 mg total) by mouth daily. Alternate 3.5 mg and 4 mg daily       Allergies:  Allergies  Allergen  Reactions  . Levaquin [Levofloxacin In D5w] Anaphylaxis and Shortness Of Breath  . Shellfish Allergy Anaphylaxis  . Amiodarone Other (See Comments)    Tremors and thyroid toxicity    Family History: Family History  Problem Relation Age of Onset  . Brain cancer Mother   . Kidney disease Neg Hx   . Prostate cancer Neg Hx   . Kidney cancer Neg Hx   . Bladder Cancer Neg Hx     Social History:  reports that  has never smoked. he has never used smokeless tobacco. He reports that he does not drink alcohol or use drugs.  ROS:                                        Physical Exam: BP (!) 151/79 (BP Location: Right Arm, Patient Position: Sitting, Cuff Size: Large)   Pulse (!) 58   Ht 6\' 1"  (1.854 m)   Wt 196 lb 9.6 oz (89.2 kg)   BMI 25.94 kg/m   Constitutional:  Alert and oriented, No acute distress.  Psychiatric: Normal mood and affect.  Laboratory Data: Lab Results  Component Value Date   WBC 5.5 10/26/2016   HGB 13.1 12/11/2016   HCT 40.6 12/11/2016   MCV 93 10/26/2016   PLT 157 10/26/2016    Lab Results  Component Value Date   CREATININE 1.11 12/05/2016    No results found for: PSA  Lab Results  Component Value Date   TESTOSTERONE 642 12/11/2016    Lab Results  Component Value Date   HGBA1C 6.5 (H) 09/07/2016    Urinalysis    Component Value Date/Time   APPEARANCEUR Clear 09/29/2016 0852   GLUCOSEU Negative 09/29/2016 0852   BILIRUBINUR Negative 09/29/2016 0852   PROTEINUR Trace (A) 09/29/2016 0852   NITRITE Negative 09/29/2016 0852   LEUKOCYTESUR Negative 09/29/2016 0852    Pertinent Imaging:   Assessment & Plan: The patient is still on finasteride and basically has exhausted multiple medications.  The role of urodynamics and possible surgery that may not help his nocturia was discussed.  He does take blood thinners.  The patient says he can live with his flow symptoms so we will not order urodynamics.  Recognizing the  rare risk of retention we will now start with overactive bladder medications.  This fails I will offer him percutaneous tibial nerve stimulation.  Myrbetriq 25 mg samples and prescription given.  Reassess in 4-6 weeks  1. BPH with obstruction/lower urinary tract symptoms 2.  Nighttime frequency - Urinalysis, Complete -  lidocaine (XYLOCAINE) 2 % jelly 1 application - ciprofloxacin (CIPRO) tablet 500 mg   No Follow-up on file.  Reece Packer, MD  [facility] [address], [place], [place] [address] [phone]

## 2017-01-18 ENCOUNTER — Other Ambulatory Visit: Payer: Self-pay | Admitting: Neurosurgery

## 2017-01-18 DIAGNOSIS — M5416 Radiculopathy, lumbar region: Secondary | ICD-10-CM | POA: Diagnosis not present

## 2017-01-18 DIAGNOSIS — M48062 Spinal stenosis, lumbar region with neurogenic claudication: Secondary | ICD-10-CM

## 2017-01-23 ENCOUNTER — Ambulatory Visit
Admission: RE | Admit: 2017-01-23 | Discharge: 2017-01-23 | Disposition: A | Discharge status: Home / Self Care | Payer: Medicare Other | Source: Ambulatory Visit | Attending: Neurosurgery | Admitting: Neurosurgery

## 2017-01-23 DIAGNOSIS — M5136 Other intervertebral disc degeneration, lumbar region: Secondary | ICD-10-CM | POA: Diagnosis not present

## 2017-01-23 DIAGNOSIS — M48062 Spinal stenosis, lumbar region with neurogenic claudication: Secondary | ICD-10-CM

## 2017-01-23 DIAGNOSIS — M48061 Spinal stenosis, lumbar region without neurogenic claudication: Secondary | ICD-10-CM | POA: Diagnosis not present

## 2017-01-23 LAB — POCT I-STAT CREATININE: Creatinine, Ser: 1.2 mg/dL (ref 0.61–1.24)

## 2017-01-23 MED ORDER — GADOBENATE DIMEGLUMINE 529 MG/ML IV SOLN
20.0000 mL | Freq: Once | INTRAVENOUS | Status: AC | PRN
  Administered 2017-01-23: 18 mL via INTRAVENOUS

## 2017-01-24 ENCOUNTER — Ambulatory Visit: Payer: Medicare Other | Admitting: Family Medicine

## 2017-01-25 ENCOUNTER — Ambulatory Visit (INDEPENDENT_AMBULATORY_CARE_PROVIDER_SITE_OTHER): Payer: Medicare Other | Admitting: Family Medicine

## 2017-01-25 ENCOUNTER — Encounter: Payer: Self-pay | Admitting: Family Medicine

## 2017-01-25 VITALS — BP 161/95 | HR 61 | Wt 195.0 lb

## 2017-01-25 DIAGNOSIS — I4891 Unspecified atrial fibrillation: Secondary | ICD-10-CM

## 2017-01-25 DIAGNOSIS — I480 Paroxysmal atrial fibrillation: Secondary | ICD-10-CM

## 2017-01-25 DIAGNOSIS — N183 Chronic kidney disease, stage 3 unspecified: Secondary | ICD-10-CM

## 2017-01-25 DIAGNOSIS — I129 Hypertensive chronic kidney disease with stage 1 through stage 4 chronic kidney disease, or unspecified chronic kidney disease: Secondary | ICD-10-CM

## 2017-01-25 LAB — COAGUCHEK XS/INR WAIVED
INR: 3.7 — ABNORMAL HIGH (ref 0.9–1.1)
Prothrombin Time: 44.1 s

## 2017-01-25 MED ORDER — APIXABAN 5 MG PO TABS: 5.0000 mg | ORAL_TABLET | Freq: Two times a day (BID) | ORAL | 6 refills | Status: DC

## 2017-01-25 MED ORDER — AMLODIPINE BESYLATE 5 MG PO TABS: 5.0000 mg | ORAL_TABLET | Freq: Every day | ORAL | 2 refills | Status: DC

## 2017-01-25 NOTE — Progress Notes (Signed)
   BP (!) 161/95   Pulse 61   Wt 195 lb (88.5 kg)   SpO2 99%   BMI 25.73 kg/m    Subjective:    Patient ID: Sean Reyes, male    DOB: [birth year], 76 y.o.   MRN: [id number]  HPI: Sean Reyes is a 76 y.o. male  PT/INR Reviewed patient's history with PT/INR and long-standing problems with treatment either too low or too high very rarely just right. Reviewed risk ofcontinuing this type of therapy and reviewed Medications.  Patient had previously been on Eliquis without problems prior to back surgery. Took Eliquis without problems. Reviewed blood pressure also too high today patient just taking metoprolol 25 mg had previously been on hydrochlorothiazide and amlodipine and stops due to blood pressure bottomed out. Will restart amlodipine.  Relevant past medical, surgical, family and social history reviewed and updated as indicated. Interim medical history since our last visit reviewed. Allergies and medications reviewed and updated.  Review of Systems  Constitutional: Negative.   Respiratory: Negative.   Cardiovascular: Negative.     Per HPI unless specifically indicated above     Objective:    BP (!) 161/95   Pulse 61   Wt 195 lb (88.5 kg)   SpO2 99%   BMI 25.73 kg/m   Wt Readings from Last 3 Encounters:  01/25/17 195 lb (88.5 kg)  01/08/17 196 lb 9.6 oz (89.2 kg)  12/20/16 197 lb (89.4 kg)    Physical Exam  Constitutional: He is oriented to person, place, and time. He appears well-developed and well-nourished.  HENT:  Head: Normocephalic and atraumatic.  Eyes: Conjunctivae and EOM are normal.  Neck: Normal range of motion.  Cardiovascular: Normal rate, regular rhythm and normal heart sounds.  Pulmonary/Chest: Effort normal and breath sounds normal.  Musculoskeletal: Normal range of motion.  Neurological: He is alert and oriented to person, place, and time.  Skin: No erythema.  Psychiatric: He has a normal mood and affect. His behavior is normal. Judgment and  thought content normal.    Results for orders placed or performed during the hospital encounter of 01/23/17  I-STAT creatinine  Result Value Ref Range   Creatinine, Ser 1.20 0.61 - 1.24 mg/dL      Assessment & Plan:   Problem List Items Addressed This Visit      Cardiovascular and Mediastinum   Atrial fibrillation ([place]) - Primary   Relevant Medications   apixaban (ELIQUIS) 5 MG TABS tablet   amLODipine (NORVASC) 5 MG tablet   Other Relevant Orders   CoaguChek XS/INR Waived   Paroxysmal atrial fibrillation (HCC)    Or atrial fibrillation will stop Coumadin and begin Eliquis. Reviewed risks benefits. With INR being too highwill stop warfarin today and recheck INR on Monday and if less than 2 will start Eliquis.      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          Follow up plan: Return in about 2 months (around 03/28/2017).

## 2017-01-25 NOTE — Assessment & Plan Note (Signed)
Discuss hypertension poor control restart amlodipine 5 mg rechecking blood pressure this winter.

## 2017-01-25 NOTE — Assessment & Plan Note (Signed)
Or atrial fibrillation will stop Coumadin and begin Eliquis. Reviewed risks benefits. With INR being too highwill stop warfarin today and recheck INR on Monday and if less than 2 will start Eliquis.

## 2017-01-29 ENCOUNTER — Ambulatory Visit: Payer: Medicare Other | Admitting: Family Medicine

## 2017-01-30 ENCOUNTER — Encounter: Payer: Self-pay | Admitting: Family Medicine

## 2017-01-30 ENCOUNTER — Ambulatory Visit (INDEPENDENT_AMBULATORY_CARE_PROVIDER_SITE_OTHER): Payer: Medicare Other | Admitting: Family Medicine

## 2017-01-30 VITALS — BP 120/75 | HR 65 | Temp 98.8°F | Wt 196.2 lb

## 2017-01-30 DIAGNOSIS — I480 Paroxysmal atrial fibrillation: Secondary | ICD-10-CM | POA: Diagnosis not present

## 2017-01-30 DIAGNOSIS — N183 Chronic kidney disease, stage 3 unspecified: Secondary | ICD-10-CM

## 2017-01-30 DIAGNOSIS — E119 Type 2 diabetes mellitus without complications: Secondary | ICD-10-CM

## 2017-01-30 DIAGNOSIS — I129 Hypertensive chronic kidney disease with stage 1 through stage 4 chronic kidney disease, or unspecified chronic kidney disease: Secondary | ICD-10-CM

## 2017-01-30 DIAGNOSIS — M48061 Spinal stenosis, lumbar region without neurogenic claudication: Secondary | ICD-10-CM

## 2017-01-30 LAB — COAGUCHEK XS/INR WAIVED
INR: 1.2 — ABNORMAL HIGH (ref 0.9–1.1)
Prothrombin Time: 15 s

## 2017-01-30 LAB — BAYER DCA HB A1C WAIVED: HB A1C (BAYER DCA - WAIVED): 6.5 % (ref <7.0)

## 2017-01-30 NOTE — Assessment & Plan Note (Signed)
The current medical regimen is effective;  continue present plan and medications.  

## 2017-01-30 NOTE — Progress Notes (Signed)
BP 120/75 (BP Location: Left Arm, Patient Position: Sitting, Cuff Size: Normal)   Pulse 65   Temp 98.8 F (37.1 C) (Oral)   Wt 196 lb 3.2 oz (89 kg)   SpO2 99%   BMI 25.89 kg/m    Subjective:    Patient ID: Sean Reyes, male    DOB: 08-14-1940, 76 y.o.   MRN: [id number]  HPI: Sean Reyes is a 76 y.o. male  Chief Complaint  Patient presents with  . Coagulation Disorder  . Diabetes  patient recheck INR is now subtherapeutic patient okay to start on Eliquis. Diabetes home glucose monitoring indicating around 120s or sono low blood sugar spells or issues. No bleeding bruising problems Atrial fibrillation has been stable Patient's followed by Dr. [name] in [place] for low back and leg pain. Patient's ability to walk and stand her being rapidly compromised Patient has a late January appointment with vascular to evaluate circulation. Patient's concerned because of the rate he is losing ability may not even be a little walk by then.  Relevant past medical, surgical, family and social history reviewed and updated as indicated. Interim medical history since our last visit reviewed. Allergies and medications reviewed and updated.  Review of Systems  Constitutional: Negative.   Respiratory: Negative.   Cardiovascular: Negative.     Per HPI unless specifically indicated above     Objective:    BP 120/75 (BP Location: Left Arm, Patient Position: Sitting, Cuff Size: Normal)   Pulse 65   Temp 98.8 F (37.1 C) (Oral)   Wt 196 lb 3.2 oz (89 kg)   SpO2 99%   BMI 25.89 kg/m   Wt Readings from Last 3 Encounters:  01/30/17 196 lb 3.2 oz (89 kg)  01/25/17 195 lb (88.5 kg)  01/08/17 196 lb 9.6 oz (89.2 kg)    Physical Exam  Constitutional: He is oriented to person, place, and time. He appears well-developed and well-nourished.  HENT:  Head: Normocephalic and atraumatic.  Eyes: Conjunctivae and EOM are normal.  Neck: Normal range of motion.  Cardiovascular: Normal rate, regular  rhythm and normal heart sounds.  Pulmonary/Chest: Effort normal and breath sounds normal.  Musculoskeletal: Normal range of motion.  Neurological: He is alert and oriented to person, place, and time.  Skin: No erythema.  Psychiatric: He has a normal mood and affect. His behavior is normal. Judgment and thought content normal.    Results for orders placed or performed in visit on 01/25/17  CoaguChek XS/INR Waived  Result Value Ref Range   INR 3.7 (H) 0.9 - 1.1   Prothrombin Time 44.1 sec      Assessment & Plan:   Problem List Items Addressed This Visit      Cardiovascular and Mediastinum   Paroxysmal atrial fibrillation ([place]) - Primary    Doing well will start Eliquis.      Relevant Orders   CoaguChek XS/INR Waived     Endocrine   Diabetes mellitus without complication ([place])    The current medical regimen is effective;  continue present plan and medications.       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         Other   Spinal stenosis of lumbar region    The current medical regimen is effective;  continue present plan  and medications.           Follow up plan: Return in about 3 months (around 04/30/2017) for Hemoglobin A1c.

## 2017-01-30 NOTE — Assessment & Plan Note (Signed)
Doing well will start Eliquis.

## 2017-02-08 DIAGNOSIS — M545 Low back pain: Secondary | ICD-10-CM | POA: Diagnosis not present

## 2017-02-08 DIAGNOSIS — G8929 Other chronic pain: Secondary | ICD-10-CM | POA: Diagnosis not present

## 2017-02-15 ENCOUNTER — Encounter: Payer: Self-pay | Admitting: Vascular Surgery

## 2017-02-15 ENCOUNTER — Ambulatory Visit (INDEPENDENT_AMBULATORY_CARE_PROVIDER_SITE_OTHER): Payer: Medicare Other | Admitting: Vascular Surgery

## 2017-02-15 VITALS — BP 136/82 | HR 67 | Resp 20 | Ht 73.0 in | Wt 194.0 lb

## 2017-02-15 DIAGNOSIS — R29898 Other symptoms and signs involving the musculoskeletal system: Secondary | ICD-10-CM

## 2017-02-15 DIAGNOSIS — I739 Peripheral vascular disease, unspecified: Secondary | ICD-10-CM | POA: Diagnosis not present

## 2017-02-15 NOTE — Progress Notes (Signed)
HISTORY AND PHYSICAL     CC:  Weakness in legs Requesting Provider:  [name], MD  HPI: This is a 77 y.o. male who states he had back surgery back in March 2018 by Dr. Saintclair Reyes.  He did quite well with this. About 5 weeks later, he couldn't get his lawnmower to start and had someone helping him with it and he tripped and says his back has been messed up ever since.  He says that since then, his legs have been weak where he has trouble getting up from a chair to walking and and being active.  He did have an xray, which revealed calcification in his aorta. He did have ABI's at [facility], which were normal with triphasic waveforms.  He states that he did have cardiac clearance prior to his surgery in March and his stress test was normal.   He does have a hx of afib and is on Eliquis.  He does have a hx of ablation. He is diabetic and he treats this with oral agents.  He is on a beta blocker and CCB for blood pressure management. He has never smoked and does not drink alcohol.    Past Medical History:  Diagnosis Date  . Anxiety   . Arthritis   . Atrial fibrillation ([place])   . Complication of anesthesia    pt reports low BP's after surgery at [facility] and difficulty awakening  . Depression   . Diabetes ([place])   . Hyperlipidemia   . Hypertension   . S/P ablation of atrial fibrillation    Ablative therapy  . Sleep apnea   . Tachycardia, unspecified     Past Surgical History:  Procedure Laterality Date  . ABLATION    . COLONOSCOPY WITH PROPOFOL N/A 10/05/2015   Procedure: COLONOSCOPY WITH PROPOFOL;  Surgeon: Lollie Sails, MD;  Location: [facility] ENDOSCOPY;  Service: Endoscopy;  Laterality: N/A;  . HERNIA REPAIR    . JOINT REPLACEMENT Bilateral    hips  . LUMBAR LAMINECTOMY/DECOMPRESSION MICRODISCECTOMY Left 09/13/2016   Procedure: Microdiscectomy - Lumbar two-three,  Lumbar three- - left;  Surgeon: Sean Kos, MD;  Location: [place];  Service: Neurosurgery;   Laterality: Left;  . TONSILLECTOMY      Allergies  Allergen Reactions  . Levaquin [Levofloxacin In D5w] Anaphylaxis and Shortness Of Breath  . Shellfish Allergy Anaphylaxis  . Amiodarone Other (See Comments)    Tremors and thyroid toxicity    Current Outpatient Medications  Medication Sig Dispense Refill  . amLODipine (NORVASC) 5 MG tablet Take 1 tablet (5 mg total) by mouth daily. 30 tablet 2  . apixaban (ELIQUIS) 5 MG TABS tablet Take 1 tablet (5 mg total) by mouth 2 (two) times daily. 60 tablet 6  . B Complex-C (B-COMPLEX WITH VITAMIN C) tablet Take 1 tablet by mouth daily.    . clomiPHENE (CLOMID) 50 MG tablet 1/2 tablet daily 30 tablet 3  . finasteride (PROSCAR) 5 MG tablet Take 1 tablet (5 mg total) by mouth daily. 90 tablet 3  . metFORMIN (GLUCOPHAGE-XR) 500 MG 24 hr tablet 2 tabs in AM and PM 120 tablet 12  . metoprolol succinate (TOPROL-XL) 25 MG 24 hr tablet Take 1 tablet (25 mg total) by mouth 2 (two) times daily. 60 tablet 12  . propafenone (RYTHMOL SR) 425 MG 12 hr capsule Take 425 mg by mouth 2 (two) times daily.    . mirabegron ER (MYRBETRIQ) 25 MG TB24 tablet Take 1 tablet (  25 mg total) by mouth daily. (Patient not taking: Reported on 02/15/2017) 30 tablet 11   No current facility-administered medications for this visit.     Family History  Problem Relation Age of Onset  . Brain cancer Mother   . Kidney disease Neg Hx   . Prostate cancer Neg Hx   . Kidney cancer Neg Hx   . Bladder Cancer Neg Hx     Social History   Socioeconomic History  . Marital status: Married    Spouse name: Not on file  . Number of children: Not on file  . Years of education: Not on file  . Highest education level: Not on file  Social Needs  . Financial resource strain: Not on file  . Food insecurity - worry: Not on file  . Food insecurity - inability: Not on file  . Transportation needs - medical: Not on file  . Transportation needs - non-medical: Not on file  Occupational History   . Occupation: full time  Tobacco Use  . Smoking status: Never Smoker  . Smokeless tobacco: Never Used  Substance and Sexual Activity  . Alcohol use: No  . Drug use: No  . Sexual activity: Not on file  Other Topics Concern  . Not on file  Social History Narrative   Married   Gets regular exercise     REVIEW OF SYSTEMS:   [X]  denotes positive finding, [ ]  denotes negative finding Cardiac  Comments:  Chest pain or chest pressure:    Shortness of breath upon exertion:    Short of breath when lying flat:    Irregular heart rhythm:        Vascular    Pain in calf, thigh, or hip brought on by ambulation:    Pain in feet at night that wakes you up from your sleep:     Blood clot in your veins:    Leg swelling:         Pulmonary    Oxygen at home:    Productive cough:     Wheezing:         Neurologic    weakness legs:  x See HPI  Sudden numbness in arms or legs:     Sudden onset of difficulty speaking or slurred speech:    Temporary loss of vision in one eye:     Problems with dizziness:         Gastrointestinal    Blood in stool:     Vomited blood:         Genitourinary    Burning when urinating:     Blood in urine:        Psychiatric    Major depression:         Hematologic    Bleeding problems:    Problems with blood clotting too easily:        Skin    Rashes or ulcers:        Constitutional    Fever or chills:      PHYSICAL EXAMINATION:  Vitals:   02/15/17 1428  BP: 136/82  Pulse: 67  Resp: 20  SpO2: 100%   Vitals:   02/15/17 1428  Weight: 194 lb (88 kg)  Height: 6\' 1"  (1.854 m)   Body mass index is 25.6 kg/m.  General:  WDWN in NAD; vital signs documented above Gait: Not observed HENT: WNL, normocephalic; hearing aids present Pulmonary: normal non-labored breathing , without Rales, rhonchi,  wheezing Cardiac: regular HR,  without  Murmurs without carotid bruits Abdomen: soft, NT, no masses; no bruits auscultated  Skin: without  rashes Vascular Exam/Pulses:  Right Left  Radial 2+ (normal) 2+ (normal)  Femoral 2+ (normal) 2+ (normal)  Popliteal 2+ (normal) 2+ (normal)  DP 2+ (normal) 2+ (normal)  PT 2+ (normal) 2+ (normal)   Extremities: without ischemic changes, without Gangrene , without cellulitis; without open wounds; spider veins on feet and ankles bilaterally Musculoskeletal: no muscle wasting or atrophy  Neurologic: A&O X 3;  No focal weakness or paresthesias are detected Psychiatric:  The pt has Normal affect.   Non-Invasive Vascular Imaging:   ABI's 12/21/16 Right:  1.49 (T) Left:  1.45 (T)  TBI's 12/21/16: Right:  1.21 Left: 1.26  Pt meds includes: Statin:  No. Beta Blocker:  Yes.   Aspirin:  Yes.   ACEI:  No. ARB:  No. CCB use:  Yes Other Antiplatelet/Anticoagulant:  Yes Eliquis   ASSESSMENT/PLAN:: 77 y.o. male with hx lumbar laminectomy with ongoing bilateral lower extremity weakness   -pt ABI's were normal with triphasic waveforms and easily palpable pedal pulses.  Do no feel that his leg weakness and pain sx are vascular in nature.   -continue workup per neurosurgery  -will see pt back as needed.    Leontine Locket, PA-C [facility] [phone]  Clinic MD:  Pt seen and examined with Dr. Donnetta Hutching   I have examined the patient, reviewed and agree with above.  Reviewed patient's prior plain film x-ray of his spine on 09/13/2016.  This does show calcification of his aorta.  Discussed that despite this he does not have any evidence of occlusive disease.  Has normal dorsalis pedis pulses and popliteal pulses bilaterally.  Reviewed his noninvasive studies from [facility] from November 2018.  This showed normal triphasic waveforms in his dorsalis pedis and posterior tibial bilaterally and also a normal pedal flow.  Will continue to follow up with Dr. Saintclair Reyes regarding degenerative disc disease.  Curt Jews, MD 02/15/2017 3:16 PM

## 2017-02-19 ENCOUNTER — Encounter: Payer: Self-pay | Admitting: Urology

## 2017-02-19 ENCOUNTER — Ambulatory Visit: Payer: Medicare Other | Admitting: Urology

## 2017-02-19 VITALS — BP 127/76 | HR 67 | Ht 73.0 in | Wt 196.0 lb

## 2017-02-19 DIAGNOSIS — R350 Frequency of micturition: Secondary | ICD-10-CM

## 2017-02-19 MED ORDER — FESOTERODINE FUMARATE ER 8 MG PO TB24: 8.0000 mg | ORAL_TABLET | Freq: Every day | ORAL | 11 refills | Status: DC

## 2017-02-19 NOTE — Progress Notes (Unsigned)
02/19/2017 11:12 AM   Sean Reyes [birth year] [id number]  Referring provider: [name], MD [address] [place], [place] [id number]  Chief Complaint  Patient presents with  . Penis Injury    myrbetriq    HPI: Sean Reyes: The patient is being treated for low testosterone. He has a CPAP machine and nocturia. 2 weeks ago his primary care doctor stop finasteride due to lack of efficacy and he is still on Flomax.  Today The patient was on finasteride for 1 year with no benefit. He gets up 6 or 8 times a night in spite of managing his sleep apnea. His flow was good to reasonable. He does not take daily Lasix or steroids. He does not have a history of congestive heart failure. Clinically noninfected.  3 weeks ago his renal function was 64 mils per minute GFR. Serum sodium was 138.  The patient failed desmopressin.  He has a slow flow and sometimes hesitates.  On a good night he gets up 3-4 times.  He had cystoscopy with some friable blood vessels noted.  He was still on finasteride last visit.  He does take blood thinners and his medical comorbidities.  He said he can live with the flow symptoms and I did not order urodynamics.  He did not respond to Myrbetriq and still has frequency and decreased flow Nocturia persisting.  He did not respond to Myrbetriq     PMH: Past Medical History:  Diagnosis Date  . Anxiety   . Arthritis   . Atrial fibrillation ([place])   . Complication of anesthesia    pt reports low BP's after surgery at [facility] and difficulty awakening  . Depression   . Diabetes ([place] Reyes)   . Hyperlipidemia   . Hypertension   . S/P ablation of atrial fibrillation    Ablative therapy  . Sleep apnea   . Tachycardia, unspecified     Surgical History: Past Surgical History:  Procedure Laterality Date  . ABLATION    . COLONOSCOPY WITH PROPOFOL N/A 10/05/2015   Procedure: COLONOSCOPY WITH PROPOFOL;  Surgeon: Lollie Sails, MD;  Location[facility] ENDOSCOPY;   Service: Endoscopy;  Laterality: N/A;  . HERNIA REPAIR    . JOINT REPLACEMENT Bilateral    hips  . LUMBAR LAMINECTOMY/DECOMPRESSION MICRODISCECTOMY Left 09/13/2016   Procedure: Microdiscectomy - Lumbar two-three,  Lumbar three- - left;  Surgeon: Kary Kos, MD;  Location: [facility];  Service: Neurosurgery;  Laterality: Left;  . TONSILLECTOMY      Home Medications:  Allergies as of 02/19/2017      Reactions   Levaquin [levofloxacin In D5w] Anaphylaxis, Shortness Of Breath   Shellfish Allergy Anaphylaxis   Amiodarone Other (See Comments)   Tremors and thyroid toxicity      Medication List        Accurate as of 02/19/17 11:12 AM. Always use your most recent med list.          amLODipine 5 MG tablet Commonly known as:  NORVASC Take 1 tablet (5 mg total) by mouth daily.   apixaban 5 MG Tabs tablet Commonly known as:  ELIQUIS Take 1 tablet (5 mg total) by mouth 2 (two) times daily.   B-complex with vitamin C tablet Take 1 tablet by mouth daily.   clomiPHENE 50 MG tablet Commonly known as:  CLOMID 1/2 tablet daily   fesoterodine 8 MG Tb24 tablet Commonly known as:  TOVIAZ Take 1 tablet (8 mg total) by mouth daily.   finasteride 5  MG tablet Commonly known as:  PROSCAR Take 1 tablet (5 mg total) by mouth daily.   metFORMIN 500 MG 24 hr tablet Commonly known as:  GLUCOPHAGE-XR 2 tabs in AM and PM   metoprolol succinate 25 MG 24 hr tablet Commonly known as:  TOPROL-XL Take 1 tablet (25 mg total) by mouth 2 (two) times daily.   mirabegron ER 25 MG Tb24 tablet Commonly known as:  MYRBETRIQ Take 1 tablet (25 mg total) by mouth daily.   propafenone 425 MG 12 hr capsule Commonly known as:  RYTHMOL SR Take 425 mg by mouth 2 (two) times daily.       Allergies:  Allergies  Allergen Reactions  . Levaquin [Levofloxacin In D5w] Anaphylaxis and Shortness Of Breath  . Shellfish Allergy Anaphylaxis  . Amiodarone Other (See Comments)    Tremors and thyroid toxicity    Family  History: Family History  Problem Relation Age of Onset  . Brain cancer Mother   . Kidney disease Neg Hx   . Prostate cancer Neg Hx   . Kidney cancer Neg Hx   . Bladder Cancer Neg Hx     Social History:  reports that  has never smoked. he has never used smokeless tobacco. He reports that he does not drink alcohol or use drugs.  ROS: UROLOGY Frequent Urination?: Yes Hard to postpone urination?: Yes Burning/pain with urination?: No Get up at night to urinate?: Yes Leakage of urine?: No Urine stream starts and stops?: Yes Trouble starting stream?: No Do you have to strain to urinate?: No Blood in urine?: No Urinary tract infection?: No Sexually transmitted disease?: No Injury to kidneys or bladder?: No Painful intercourse?: No Weak stream?: No Erection problems?: No Penile pain?: No  Gastrointestinal Nausea?: No Vomiting?: No Indigestion/heartburn?: No Diarrhea?: No Constipation?: No  Constitutional Fever: No Night sweats?: No Weight loss?: No Fatigue?: No  Skin Skin rash/lesions?: No Itching?: No  Eyes Blurred vision?: No Double vision?: No  Ears/Nose/Throat Sore throat?: No Sinus problems?: No  Hematologic/Lymphatic Swollen glands?: No Easy bruising?: No  Cardiovascular Leg swelling?: No Chest pain?: No  Respiratory Cough?: No Shortness of breath?: No  Endocrine Excessive thirst?: No  Musculoskeletal Back pain?: No Joint pain?: No  Neurological Headaches?: No Dizziness?: No  Psychologic Depression?: No Anxiety?: No  Physical Exam: BP 127/76   Pulse 67   Ht 6\' 1"  (1.854 m)   Wt 88.9 kg (196 lb)   BMI 25.86 kg/m   Constitutional:  Alert and oriented, No acute distress. HEENT: [place] AT, moist mucus membranes.  Trachea midline, no masses. Cardiovascular: No clubbing, cyanosis, or edema. Respiratory: Normal respiratory effort, no increased work of breathing. GI: Abdomen is soft, nontender, nondistended, no abdominal masses GU: No  CVA tenderness. *** Skin: No rashes, bruises or suspicious lesions. Lymph: No cervical or inguinal adenopathy. Neurologic: Grossly intact, no focal deficits, moving all 4 extremities. Psychiatric: Normal mood and affect.  Laboratory Data: Lab Results  Component Value Date   WBC 5.5 10/26/2016   HGB 13.1 12/11/2016   HCT 40.6 12/11/2016   MCV 93 10/26/2016   PLT 157 10/26/2016    Lab Results  Component Value Date   CREATININE 1.20 01/23/2017    No results found for: PSA  Lab Results  Component Value Date   TESTOSTERONE 642 12/11/2016    Lab Results  Component Value Date   HGBA1C 6.5 (H) 09/07/2016    Urinalysis    Component Value Date/Time   APPEARANCEUR Clear 01/08/2017 1328  GLUCOSEU Trace 01/08/2017 1328   BILIRUBINUR Negative 01/08/2017 1328   PROTEINUR 1+ (A) 01/08/2017 1328   NITRITE Negative 01/08/2017 1328   LEUKOCYTESUR Negative 01/08/2017 1328    Pertinent Imaging:  Assessment & Plan: Reassess on Toviaz 8 mg in 4-6 weeks and offer percutaneous tibial nerve stimulation  There are no diagnoses linked to this encounter.  No Follow-up on file.  Reece Packer, MD  [facility] [address], [place], [place] [address] [phone]

## 2017-02-20 DIAGNOSIS — M47816 Spondylosis without myelopathy or radiculopathy, lumbar region: Secondary | ICD-10-CM | POA: Diagnosis not present

## 2017-02-20 DIAGNOSIS — Z6825 Body mass index (BMI) 25.0-25.9, adult: Secondary | ICD-10-CM | POA: Diagnosis not present

## 2017-02-20 DIAGNOSIS — R030 Elevated blood-pressure reading, without diagnosis of hypertension: Secondary | ICD-10-CM | POA: Diagnosis not present

## 2017-02-20 DIAGNOSIS — I10 Essential (primary) hypertension: Secondary | ICD-10-CM | POA: Diagnosis not present

## 2017-03-06 DIAGNOSIS — M5440 Lumbago with sciatica, unspecified side: Secondary | ICD-10-CM | POA: Diagnosis not present

## 2017-03-13 ENCOUNTER — Encounter (HOSPITAL_COMMUNITY): Payer: Medicare Other

## 2017-03-13 ENCOUNTER — Encounter: Payer: Medicare Other | Admitting: Vascular Surgery

## 2017-03-20 DIAGNOSIS — M5440 Lumbago with sciatica, unspecified side: Secondary | ICD-10-CM | POA: Diagnosis not present

## 2017-03-21 ENCOUNTER — Other Ambulatory Visit: Payer: Self-pay | Admitting: Neurosurgery

## 2017-03-21 DIAGNOSIS — M5440 Lumbago with sciatica, unspecified side: Secondary | ICD-10-CM

## 2017-03-26 ENCOUNTER — Ambulatory Visit (INDEPENDENT_AMBULATORY_CARE_PROVIDER_SITE_OTHER): Payer: Medicare Other | Admitting: Urology

## 2017-03-26 ENCOUNTER — Encounter: Payer: Self-pay | Admitting: Urology

## 2017-03-26 VITALS — BP 144/79 | HR 67 | Ht 73.0 in | Wt 197.4 lb

## 2017-03-26 DIAGNOSIS — R351 Nocturia: Secondary | ICD-10-CM | POA: Diagnosis not present

## 2017-03-26 NOTE — Progress Notes (Signed)
03/26/2017 10:04 AM   Sean Reyes April 02, 1940 [id number]  Referring provider: [name], MD [address] [place], [place] [address]  Chief Complaint  Patient presents with  . Follow-up    HPI: [place]:The patient is being treated for low testosterone. He has a CPAPmachine and nocturia. 2 weeks ago his primary care doctor stop finasteride due to lack of efficacy and he is still on Flomax.  Today The patient was on finasteride for 1 year with no benefit. He gets up 6 or 8 times a night in spite of managing his sleep apnea. His flow was good to reasonable. He does not take daily Lasix or steroids. He does not have a history of congestive heart failure. Clinically noninfected.  3 weeks ago his renal function was 64 mils per minute GFR. Serum sodium was 138  Today Frequency and nocturia are stable.  He failed desmopressin nose spray r.  [name] wanted him cystoscoped for bladder outlet obstructive symptoms and decreased flow.  On requestioning the patient has a slow flow and sometimes he hesitates.  He does feel empty but can double void 15 minutes later.  On a good night he gets up 3 or 4 times and other times more frequently.  He has never smoked  Cystoscopy: After written consent the patient underwent sterile flexible cystoscopy.  The penile and bulbar urethra were normal.  His prostatic urethra was approximately 2.5 cm in length.  He had bilobar enlargement of the prostate.  He had a few friable blood vessels.  He had a grade 1 out of 4 bladder trabeculation.  He had no cystitis or carcinoma.  He tolerated the procedure well  The patient is still on finasteride and basically has exhausted multiple medications.  The role of urodynamics and possible surgery that may not help his nocturia was discussed.  He does take blood thinners.  The patient says he can live with his flow symptoms so we will not order urodynamics.  Recognizing the rare risk of retention we will now  start with overactive bladder medications.  This fails I will offer him percutaneous tibial nerve stimulation.  Myrbetriq 25 mg samples and prescription given.   Today The patient did not respond to [place] or Myrbetriq.  Frequency and nocturia are stable.  He now only gets up twice at night and he is on finasteride monotherapy  I talked about percutaneous tibial nerve stimulation   PMH: Past Medical History:  Diagnosis Date  . Anxiety   . Arthritis   . Atrial fibrillation ([place])   . Complication of anesthesia    pt reports low BP's after surgery at [facility] and difficulty awakening  . Depression   . Diabetes ([place])   . Hyperlipidemia   . Hypertension   . S/P ablation of atrial fibrillation    Ablative therapy  . Sleep apnea   . Tachycardia, unspecified     Surgical History: Past Surgical History:  Procedure Laterality Date  . ABLATION    . COLONOSCOPY WITH PROPOFOL N/A 10/05/2015   Procedure: COLONOSCOPY WITH PROPOFOL;  Surgeon: Lollie Sails, MD;  Location: [facility] ENDOSCOPY;  Service: Endoscopy;  Laterality: N/A;  . HERNIA REPAIR    . JOINT REPLACEMENT Bilateral    hips  . LUMBAR LAMINECTOMY/DECOMPRESSION MICRODISCECTOMY Left 09/13/2016   Procedure: Microdiscectomy - Lumbar two-three,  Lumbar three- - left;  Surgeon: Kary Kos, MD;  Location: [place];  Service: Neurosurgery;  Laterality: Left;  . TONSILLECTOMY  Home Medications:  Allergies as of 03/26/2017      Reactions   Levaquin [levofloxacin In D5w] Anaphylaxis, Shortness Of Breath   Shellfish Allergy Anaphylaxis   Amiodarone Other (See Comments)   Tremors and thyroid toxicity      Medication List        Accurate as of 03/26/17 10:04 AM. Always use your most recent med list.          amLODipine 5 MG tablet Commonly known as:  NORVASC Take 1 tablet (5 mg total) by mouth daily.   apixaban 5 MG Tabs tablet Commonly known as:  ELIQUIS Take 1 tablet (5 mg total) by mouth 2 (two) times daily.     B-complex with vitamin C tablet Take 1 tablet by mouth daily.   clomiPHENE 50 MG tablet Commonly known as:  CLOMID 1/2 tablet daily   finasteride 5 MG tablet Commonly known as:  PROSCAR Take 1 tablet (5 mg total) by mouth daily.   meloxicam 7.5 MG tablet Commonly known as:  MOBIC Take 7.5 mg by mouth daily.   metFORMIN 500 MG 24 hr tablet Commonly known as:  GLUCOPHAGE-XR 2 tabs in AM and PM   metoprolol succinate 25 MG 24 hr tablet Commonly known as:  TOPROL-XL Take 1 tablet (25 mg total) by mouth 2 (two) times daily.   propafenone 425 MG 12 hr capsule Commonly known as:  RYTHMOL SR Take 425 mg by mouth 2 (two) times daily.       Allergies:  Allergies  Allergen Reactions  . Levaquin [Levofloxacin In D5w] Anaphylaxis and Shortness Of Breath  . Shellfish Allergy Anaphylaxis  . Amiodarone Other (See Comments)    Tremors and thyroid toxicity    Family History: Family History  Problem Relation Age of Onset  . Brain cancer Mother   . Kidney disease Neg Hx   . Prostate cancer Neg Hx   . Kidney cancer Neg Hx   . Bladder Cancer Neg Hx     Social History:  reports that  has never smoked. he has never used smokeless tobacco. He reports that he does not drink alcohol or use drugs.  ROS: UROLOGY Frequent Urination?: No Hard to postpone urination?: Yes Burning/pain with urination?: No Get up at night to urinate?: Yes Leakage of urine?: No Urine stream starts and stops?: Yes Trouble starting stream?: No Do you have to strain to urinate?: No Blood in urine?: No Urinary tract infection?: No Sexually transmitted disease?: No Injury to kidneys or bladder?: No Painful intercourse?: No Weak stream?: No Erection problems?: No Penile pain?: No  Gastrointestinal Nausea?: No Vomiting?: No Indigestion/heartburn?: No Diarrhea?: No Constipation?: No  Constitutional Fever: No Night sweats?: No Weight loss?: No Fatigue?: No  Skin Skin rash/lesions?:  No Itching?: No  Eyes Blurred vision?: No Double vision?: No  Ears/Nose/Throat Sore throat?: No Sinus problems?: No  Hematologic/Lymphatic Swollen glands?: No Easy bruising?: No  Cardiovascular Leg swelling?: No Chest pain?: No  Respiratory Cough?: No Shortness of breath?: No  Endocrine Excessive thirst?: No  Musculoskeletal Back pain?: No Joint pain?: No  Neurological Headaches?: No Dizziness?: No  Psychologic Depression?: No Anxiety?: No  Physical Exam: BP (!) 144/79 (BP Location: Right Arm, Patient Position: Sitting, Cuff Size: Normal)   Pulse 67   Ht 6\' 1"  (1.854 m)   Wt 197 lb 6.4 oz (89.5 kg)   BMI 26.04 kg/m   Constitutional:  Alert and oriented, No acute distress. HEENT: [place] AT, moist mucus membranes.  Trachea midline,  no masses.   Laboratory Data: Lab Results  Component Value Date   WBC 5.5 10/26/2016   HGB 13.1 12/11/2016   HCT 40.6 12/11/2016   MCV 93 10/26/2016   PLT 157 10/26/2016    Lab Results  Component Value Date   CREATININE 1.20 01/23/2017    No results found for: PSA  Lab Results  Component Value Date   TESTOSTERONE 642 12/11/2016    Lab Results  Component Value Date   HGBA1C 6.5 (H) 09/07/2016    Urinalysis    Component Value Date/Time   APPEARANCEUR Clear 01/08/2017 1328   GLUCOSEU Trace 01/08/2017 1328   BILIRUBINUR Negative 01/08/2017 1328   PROTEINUR 1+ (A) 01/08/2017 1328   NITRITE Negative 01/08/2017 1328   LEUKOCYTESUR Negative 01/08/2017 1328    Pertinent Imaging: none  Assessment & Plan: I will see the patient as needed.  He will consider percutaneous tibial nerve stimulation as if his symptoms return.  He will get his finasteride by primary care  There are no diagnoses linked to this encounter.  No Follow-up on file.  Reece Packer, MD  [facility] [address] [place],  [address] [phone]

## 2017-03-29 DIAGNOSIS — E782 Mixed hyperlipidemia: Secondary | ICD-10-CM | POA: Diagnosis not present

## 2017-03-29 DIAGNOSIS — I4891 Unspecified atrial fibrillation: Secondary | ICD-10-CM | POA: Diagnosis not present

## 2017-03-29 DIAGNOSIS — I480 Paroxysmal atrial fibrillation: Secondary | ICD-10-CM | POA: Diagnosis not present

## 2017-03-29 DIAGNOSIS — G4733 Obstructive sleep apnea (adult) (pediatric): Secondary | ICD-10-CM | POA: Diagnosis not present

## 2017-03-29 DIAGNOSIS — I10 Essential (primary) hypertension: Secondary | ICD-10-CM | POA: Diagnosis not present

## 2017-03-30 ENCOUNTER — Ambulatory Visit
Admission: RE | Admit: 2017-03-30 | Discharge: 2017-03-30 | Disposition: A | Discharge status: Home / Self Care | Payer: Medicare Other | Source: Ambulatory Visit | Attending: Neurosurgery | Admitting: Neurosurgery

## 2017-03-30 DIAGNOSIS — M5126 Other intervertebral disc displacement, lumbar region: Secondary | ICD-10-CM | POA: Diagnosis not present

## 2017-03-30 DIAGNOSIS — M48061 Spinal stenosis, lumbar region without neurogenic claudication: Secondary | ICD-10-CM | POA: Insufficient documentation

## 2017-03-30 DIAGNOSIS — M47896 Other spondylosis, lumbar region: Secondary | ICD-10-CM | POA: Insufficient documentation

## 2017-03-30 DIAGNOSIS — M5440 Lumbago with sciatica, unspecified side: Secondary | ICD-10-CM

## 2017-04-04 DIAGNOSIS — M47816 Spondylosis without myelopathy or radiculopathy, lumbar region: Secondary | ICD-10-CM | POA: Diagnosis not present

## 2017-04-27 ENCOUNTER — Other Ambulatory Visit: Payer: Self-pay | Admitting: Family Medicine

## 2017-04-27 NOTE — Telephone Encounter (Signed)
Benazepril (Lotensin) refill Last OV: 05/18/16 drug was discontinued at this visit Last Refill:06/02/15 Pharmacy:[facility] PCP: Golden Pop

## 2017-05-02 ENCOUNTER — Ambulatory Visit (INDEPENDENT_AMBULATORY_CARE_PROVIDER_SITE_OTHER): Payer: Medicare Other | Admitting: Family Medicine

## 2017-05-02 ENCOUNTER — Encounter: Payer: Self-pay | Admitting: Family Medicine

## 2017-05-02 VITALS — BP 151/83 | HR 62 | Wt 195.0 lb

## 2017-05-02 DIAGNOSIS — N183 Chronic kidney disease, stage 3 unspecified: Secondary | ICD-10-CM

## 2017-05-02 DIAGNOSIS — E119 Type 2 diabetes mellitus without complications: Secondary | ICD-10-CM | POA: Diagnosis not present

## 2017-05-02 DIAGNOSIS — I129 Hypertensive chronic kidney disease with stage 1 through stage 4 chronic kidney disease, or unspecified chronic kidney disease: Secondary | ICD-10-CM | POA: Diagnosis not present

## 2017-05-02 DIAGNOSIS — I480 Paroxysmal atrial fibrillation: Secondary | ICD-10-CM

## 2017-05-02 DIAGNOSIS — E782 Mixed hyperlipidemia: Secondary | ICD-10-CM

## 2017-05-02 LAB — BAYER DCA HB A1C WAIVED: HB A1C (BAYER DCA - WAIVED): 6.4 % (ref <7.0)

## 2017-05-02 NOTE — Assessment & Plan Note (Signed)
The current medical regimen is effective;  continue present plan and medications.  

## 2017-05-02 NOTE — Assessment & Plan Note (Signed)
For hypertension will not increase medication because of patient's experience previously with low blood sugar spells and risk of falling on Eliquis.  Patient will continue observing blood pressure response

## 2017-05-02 NOTE — Assessment & Plan Note (Signed)
Doing well on Eliquis with no brusing bleeding issues will continue

## 2017-05-02 NOTE — Progress Notes (Signed)
BP (!) 151/83   Pulse 62   Wt 195 lb (88.5 kg)   SpO2 99%   BMI 25.73 kg/m    Subjective:    Patient ID: Sean Reyes, male    DOB: Jun 04, 1940, 77 y.o.   MRN: [id number]  HPI: Sean Reyes is a 77 y.o. male  Chief Complaint  Patient presents with  . Follow-up  . Diabetes  Patient all in all doing well diabetes good control no low blood sugar spells or issues with medications. Taking this without problems bleeding bruising issues. Reviewed urology notes patient unfortunately is exhausted medical therapy will continue observing BPH issues understands next steps potential surgery. Reviewed back issues and looks like spending future surgery. Hypertension blood pressure elevated today patient's blood pressure when checking at home and this especially in the afternoon is good.  Sometimes high in the morning.  Takes most of his medications in the evening. Unable to take amlodipine because of hypotension.  Relevant past medical, surgical, family and social history reviewed and updated as indicated. Interim medical history since our last visit reviewed. Allergies and medications reviewed and updated.  Review of Systems  Constitutional: Negative.   Respiratory: Negative.   Cardiovascular: Negative.     Per HPI unless specifically indicated above     Objective:    BP (!) 151/83   Pulse 62   Wt 195 lb (88.5 kg)   SpO2 99%   BMI 25.73 kg/m   Wt Readings from Last 3 Encounters:  05/02/17 195 lb (88.5 kg)  03/26/17 197 lb 6.4 oz (89.5 kg)  02/19/17 196 lb (88.9 kg)    Physical Exam  Constitutional: He is oriented to person, place, and time. He appears well-developed and well-nourished.  HENT:  Head: Normocephalic and atraumatic.  Eyes: Conjunctivae and EOM are normal.  Neck: Normal range of motion.  Cardiovascular: Normal rate, regular rhythm and normal heart sounds.  Pulmonary/Chest: Effort normal and breath sounds normal.  Musculoskeletal: Normal range of motion.    Neurological: He is alert and oriented to person, place, and time.  Skin: No erythema.  Psychiatric: He has a normal mood and affect. His behavior is normal. Judgment and thought content normal.    Results for orders placed or performed in visit on 01/30/17  CoaguChek XS/INR Waived  Result Value Ref Range   INR 1.2 (H) 0.9 - 1.1   Prothrombin Time 15.0 sec  Bayer DCA Hb A1c Waived  Result Value Ref Range   Bayer DCA Hb A1c Waived 6.5 <7.0 %      Assessment & Plan:   Problem List Items Addressed This Visit      Cardiovascular and Mediastinum   Atrial fibrillation ([place])    Doing well on Eliquis with no brusing bleeding issues will continue        Endocrine   Diabetes mellitus without complication ([place]) - Primary    The current medical regimen is effective;  continue present plan and medications.       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  Patient will continue observing blood pressure response        Other   Mixed hyperlipidemia   Relevant Orders   Bayer DCA Hb A1c Waived  Follow up plan: Return in about 3 months (around 08/02/2017) for Hemoglobin A1c.

## 2017-05-09 DIAGNOSIS — H25813 Combined forms of age-related cataract, bilateral: Secondary | ICD-10-CM | POA: Diagnosis not present

## 2017-05-09 DIAGNOSIS — H524 Presbyopia: Secondary | ICD-10-CM | POA: Diagnosis not present

## 2017-05-09 DIAGNOSIS — E119 Type 2 diabetes mellitus without complications: Secondary | ICD-10-CM | POA: Diagnosis not present

## 2017-05-09 DIAGNOSIS — H52223 Regular astigmatism, bilateral: Secondary | ICD-10-CM | POA: Diagnosis not present

## 2017-05-21 DIAGNOSIS — E1140 Type 2 diabetes mellitus with diabetic neuropathy, unspecified: Secondary | ICD-10-CM | POA: Diagnosis not present

## 2017-05-21 DIAGNOSIS — I10 Essential (primary) hypertension: Secondary | ICD-10-CM | POA: Diagnosis not present

## 2017-05-21 DIAGNOSIS — I4891 Unspecified atrial fibrillation: Secondary | ICD-10-CM | POA: Diagnosis not present

## 2017-05-21 DIAGNOSIS — G4733 Obstructive sleep apnea (adult) (pediatric): Secondary | ICD-10-CM | POA: Diagnosis not present

## 2017-05-22 DIAGNOSIS — M47816 Spondylosis without myelopathy or radiculopathy, lumbar region: Secondary | ICD-10-CM | POA: Diagnosis not present

## 2017-05-22 DIAGNOSIS — R030 Elevated blood-pressure reading, without diagnosis of hypertension: Secondary | ICD-10-CM | POA: Diagnosis not present

## 2017-05-22 DIAGNOSIS — Z6825 Body mass index (BMI) 25.0-25.9, adult: Secondary | ICD-10-CM | POA: Diagnosis not present

## 2017-05-28 DIAGNOSIS — I480 Paroxysmal atrial fibrillation: Secondary | ICD-10-CM | POA: Diagnosis not present

## 2017-05-28 DIAGNOSIS — Z79899 Other long term (current) drug therapy: Secondary | ICD-10-CM | POA: Diagnosis not present

## 2017-05-28 DIAGNOSIS — E782 Mixed hyperlipidemia: Secondary | ICD-10-CM | POA: Diagnosis not present

## 2017-05-28 DIAGNOSIS — I10 Essential (primary) hypertension: Secondary | ICD-10-CM | POA: Diagnosis not present

## 2017-05-31 ENCOUNTER — Telehealth: Payer: Self-pay

## 2017-05-31 NOTE — Telephone Encounter (Signed)
Fax from [place] for patient to be on Statin Medication.  Message: The patient has recent pharmacy claims for antidiabetic therapy but no claim for statin medication. Please consider statin therapy for this patient with diabetes as appropriate.

## 2017-06-06 ENCOUNTER — Other Ambulatory Visit: Payer: Self-pay | Admitting: Neurosurgery

## 2017-06-07 NOTE — Telephone Encounter (Signed)
Dr. Jeananne Rama, Can this encounter be signed?

## 2017-06-19 NOTE — Pre-Procedure Instructions (Signed)
Ahmar Dickison  06/19/2017      [facility] [place], [place] - [place] [address] [place] [address] Phone: [phone] Fax: [phone]  [facility] - [place], [place] - [address] [place] [address] Phone: [phone] Fax: [phone]    Your procedure is scheduled on  Wednesday 06/27/17  Report to [place] at 630 A.M.  Call this number if you have problems the morning of surgery:  [phone]   Remember:  Do not eat food or drink liquids after midnight.  Take these medicines the morning of surgery with A SIP OF WATER - METOPROLOL (TOPROL), PROPAFENONE (RYTHMOL)  7 days prior to surgery STOP taking any Aspirin(unless otherwise instructed by your surgeon), Aleve, Naproxen, Ibuprofen, Motrin, Advil, Goody's, BC's, all herbal medications, fish oil, and all vitamins    How to Manage Your Diabetes Before and After Surgery  Why is it important to control my blood sugar before and after surgery? . Improving blood sugar levels before and after surgery helps healing and can limit problems. . A way of improving blood sugar control is eating a healthy diet by: o  Eating less sugar and carbohydrates o  Increasing activity/exercise o  Talking with your doctor about reaching your blood sugar goals . High blood sugars (greater than 180 mg/dL) can raise your risk of infections and slow your recovery, so you will need to focus on controlling your diabetes during the weeks before surgery. . Make sure that the doctor who takes care of your diabetes knows about your planned surgery including the date and location.  How do I manage my blood sugar before surgery? . Check your blood sugar at least 4 times a day, starting 2 days before surgery, to make sure that the level is not too high or low. o Check your blood sugar the morning of your surgery when you wake up and every 2 hours until you get to the Short Stay  unit. . If your blood sugar is less than 70 mg/dL, you will need to treat for low blood sugar: o Do not take insulin. o Treat a low blood sugar (less than 70 mg/dL) with  cup of clear juice (cranberry or apple), 4 glucose tablets, OR glucose gel. Recheck blood sugar in 15 minutes after treatment (to make sure it is greater than 70 mg/dL). If your blood sugar is not greater than 70 mg/dL on recheck, call [phone] o  for further instructions. . Report your blood sugar to the short stay nurse when you get to Short Stay.  . If you are admitted to the hospital after surgery: o Your blood sugar will be checked by the staff and you will probably be given insulin after surgery (instead of oral diabetes medicines) to make sure you have good blood sugar levels. o The goal for blood sugar control after surgery is 80-180 mg/dL.              WHAT DO I DO ABOUT MY DIABETES MEDICATION?   [name] Do not take oral diabetes medicines (pills) the morning of surgery.  . THE NIGHT BEFORE SURGERY, take ___________ units of ___________insulin.       . THE MORNING OF SURGERY, take _____________ units of __________insulin.  . The day of surgery, do not take other diabetes injectables, including Byetta (exenatide), Bydureon (exenatide ER), Victoza (liraglutide), or Trulicity (dulaglutide).  . If your CBG  is greater than 220 mg/dL, you may take  of your sliding scale (correction) dose of insulin.  Other Instructions:          Patient Signature:  Date:   Nurse Signature:  Date:   Reviewed and Endorsed by [facility] Patient Education Committee, August 2015  Do not wear jewelry, make-up or nail polish.  Do not wear lotions, powders, or perfumes, or deodorant.  Do not shave 48 hours prior to surgery.  Men may shave face and neck.  Do not bring valuables to the hospital.  [facility] is not responsible for any belongings or valuables.  Contacts, dentures or bridgework may not be worn into  surgery.  Leave your suitcase in the car.  After surgery it may be brought to your room.  For patients admitted to the hospital, discharge time will be determined by your treatment team.  Patients discharged the day of surgery will not be allowed to drive home.   Name and phone number of your driver:    Special instructions:  [place] - Preparing for Surgery  Before surgery, you can play an important role.  Because skin is not sterile, your skin needs to be as free of germs as possible.  You can reduce the number of germs on you skin by washing with CHG (chlorahexidine gluconate) soap before surgery.  CHG is an antiseptic cleaner which kills germs and bonds with the skin to continue killing germs even after washing.  Please DO NOT use if you have an allergy to CHG or antibacterial soaps.  If your skin becomes reddened/irritated stop using the CHG and inform your nurse when you arrive at Short Stay.  Do not shave (including legs and underarms) for at least 48 hours prior to the first CHG shower.  You may shave your face.  Please follow these instructions carefully:   1.  Shower with CHG Soap the night before surgery and the                                morning of Surgery.  2.  If you choose to wash your hair, wash your hair first as usual with your       normal shampoo.  3.  After you shampoo, rinse your hair and body thoroughly to remove the                      Shampoo.  4.  Use CHG as you would any other liquid soap.  You can apply chg directly       to the skin and wash gently with scrungie or a clean washcloth.  5.  Apply the CHG Soap to your body ONLY FROM THE NECK DOWN.        Do not use on open wounds or open sores.  Avoid contact with your eyes,       ears, mouth and genitals (private parts).  Wash genitals (private parts)       with your normal soap.  6.  Wash thoroughly, paying special attention to the area where your surgery        will be performed.  7.  Thoroughly rinse your  body with warm water from the neck down.  8.  DO NOT shower/wash with your normal soap after using and rinsing off       the CHG Soap.  9.  Pat yourself dry with  a clean towel.            10.  Wear clean pajamas.            11.  Place clean sheets on your bed the night of your first shower and do not        sleep with pets.  Day of Surgery  Do not apply any lotions/deoderants the morning of surgery.  Please wear clean clothes to the hospital/surgery center.     Please read over the following fact sheets that you were given. MRSA Information and Surgical Site Infection Prevention

## 2017-06-20 ENCOUNTER — Other Ambulatory Visit: Payer: Self-pay

## 2017-06-20 ENCOUNTER — Encounter (HOSPITAL_COMMUNITY): Payer: Self-pay

## 2017-06-20 ENCOUNTER — Encounter (HOSPITAL_COMMUNITY)
Admission: RE | Admit: 2017-06-20 | Discharge: 2017-06-20 | Disposition: A | Discharge status: Home / Self Care | Payer: Medicare Other | Source: Ambulatory Visit | Attending: Neurosurgery | Admitting: Neurosurgery

## 2017-06-20 DIAGNOSIS — Z01812 Encounter for preprocedural laboratory examination: Secondary | ICD-10-CM | POA: Insufficient documentation

## 2017-06-20 DIAGNOSIS — E119 Type 2 diabetes mellitus without complications: Secondary | ICD-10-CM | POA: Insufficient documentation

## 2017-06-20 DIAGNOSIS — I10 Essential (primary) hypertension: Secondary | ICD-10-CM | POA: Insufficient documentation

## 2017-06-20 HISTORY — DX: Gastro-esophageal reflux disease without esophagitis: K21.9

## 2017-06-20 HISTORY — DX: Nocturia: R35.1

## 2017-06-20 HISTORY — DX: Cardiac arrhythmia, unspecified: I49.9

## 2017-06-20 HISTORY — DX: Anemia, unspecified: D64.9

## 2017-06-20 LAB — BASIC METABOLIC PANEL
Anion gap: 8 (ref 5–15)
BUN: 17 mg/dL (ref 6–20)
CO2: 29 mmol/L (ref 22–32)
Calcium: 9.4 mg/dL (ref 8.9–10.3)
Chloride: 102 mmol/L (ref 101–111)
Creatinine, Ser: 1.21 mg/dL (ref 0.61–1.24)
GFR calc Af Amer: >60 mL/min (ref >60)
GFR calc non Af Amer: 57 mL/min — ABNORMAL LOW (ref >60)
Glucose, Bld: 147 mg/dL — ABNORMAL HIGH (ref 65–99)
Potassium: 4.2 mmol/L (ref 3.5–5.1)
Sodium: 139 mmol/L (ref 135–145)

## 2017-06-20 LAB — CBC
HCT: 39.5 % (ref 39.0–52.0)
Hemoglobin: 13.2 g/dL (ref 13.0–17.0)
MCH: 31.7 pg (ref 26.0–34.0)
MCHC: 33.4 g/dL (ref 30.0–36.0)
MCV: 95 fL (ref 78.0–100.0)
Platelets: 149 10*3/uL — ABNORMAL LOW (ref 150–400)
RBC: 4.16 MIL/uL — ABNORMAL LOW (ref 4.22–5.81)
RDW: 13.5 % (ref 11.5–15.5)
WBC: 7.8 10*3/uL (ref 4.0–10.5)

## 2017-06-20 LAB — TYPE AND SCREEN
ABO/RH(D): A POS
Antibody Screen: NEGATIVE

## 2017-06-20 LAB — GLUCOSE, CAPILLARY
Glucose-Capillary: 218 mg/dL — ABNORMAL HIGH (ref 65–99)
Glucose-Capillary: 270 mg/dL — ABNORMAL HIGH (ref 65–99)

## 2017-06-20 LAB — SURGICAL PCR SCREEN
MRSA, PCR: NEGATIVE
Staphylococcus aureus: NEGATIVE

## 2017-06-20 LAB — ABO/RH: ABO/RH(D): A POS

## 2017-06-20 NOTE — Progress Notes (Signed)
Requested ekg from Dr. Alveria Apley office.  Patient stated his last dose of eliquis will be 06/23/17.

## 2017-06-21 NOTE — Progress Notes (Signed)
Anesthesia Chart Review:   Case:  [id number] Date/Time:  06/27/17 0815   Procedures:      XLIF - L2-L3 - L3-L4, PLIF L4-5 (N/A )     POSTERIOR LUMBAR FUSION 1 LEVEL (N/A Back)   Anesthesia type:  General   Pre-op diagnosis:  Spondylosis   Location:  [facility] OR ROOM 19 / [place] OR   Surgeon:  Kary Kos, MD      DISCUSSION:   - Pt is a 77 year old male with hx afib (s/p ablation with no recurrence). Last dose eliquis 06/23/17 - Has cardiac clearance for surgery   VS: BP (!) 141/69   Pulse 62   Temp 36.5 C   Resp 20   Ht 6\' 1"  (1.854 m)   Wt 194 lb 11.2 oz (88.3 kg)   SpO2 100%   BMI 25.69 kg/m    PROVIDERS: PCP is [name], MD  Cardiologist is [name], MD. Pt cleared for surgery at low risk at last office visit 05/28/17 (notes in care everywhere)   LABS: Labs reviewed: Acceptable for surgery.  - HbA1c was 6.4 on 05/02/17 - PT/INR will be obtained day of surgery  (all labs ordered are listed, but only abnormal results are displayed)  Labs Reviewed  GLUCOSE, CAPILLARY - Abnormal; Notable for the following components:      Result Value   Glucose-Capillary 218 (*)    All other components within normal limits  GLUCOSE, CAPILLARY - Abnormal; Notable for the following components:   Glucose-Capillary 270 (*)    All other components within normal limits  CBC - Abnormal; Notable for the following components:   RBC 4.16 (*)    Platelets 149 (*)    All other components within normal limits  BASIC METABOLIC PANEL - Abnormal; Notable for the following components:   Glucose, Bld 147 (*)    GFR calc non Af Amer 57 (*)    All other components within normal limits  SURGICAL PCR SCREEN  TYPE AND SCREEN  ABO/RH    EKG 05/28/2017 [facility]): Sinus rhythm with first-degree AV block.   CV:  Nuclear stress test 09/05/16 (care everywhere):  - Normal Lexiscan infusion EKG - Normal myocardial perfusion without evidence of myocardial ischemia  Echo 09/05/16 (care  everywhere):  1.  Normal LV systolic function.  EF 55% 2.  Normal RV systolic function. 3.  Mild MR, mild TR, mild PR. 4.  Trivial AR. 5.  No valvular stenosis.    Past Medical History:  Diagnosis Date  . Anemia   . Anxiety   . Arthritis   . Atrial fibrillation ([place])   . Complication of anesthesia    pt reports low BP's after surgery at [facility] and difficulty awakening  . Depression   . Diabetes ([place])   . Dysrhythmia   . GERD (gastroesophageal reflux disease)    OCC TAKES ALKA SELTZER  . Hyperlipidemia   . Hypertension   . Nocturia   . S/P ablation of atrial fibrillation    Ablative therapy  . Sleep apnea    CPAP   . Tachycardia, unspecified     Past Surgical History:  Procedure Laterality Date  . ABLATION    . COLONOSCOPY WITH PROPOFOL N/A 10/05/2015   Procedure: COLONOSCOPY WITH PROPOFOL;  Surgeon: Lollie Sails, MD;  Location: [facility] ENDOSCOPY;  Service: Endoscopy;  Laterality: N/A;  . HERNIA REPAIR    . JOINT REPLACEMENT Bilateral    hips  RT+  LEFT X2   .  LUMBAR LAMINECTOMY/DECOMPRESSION MICRODISCECTOMY Left 09/13/2016   Procedure: Microdiscectomy - Lumbar two-three,  Lumbar three- - left;  Surgeon: Kary Kos, MD;  Location: [facility];  Service: Neurosurgery;  Laterality: Left;  . TONSILLECTOMY      MEDICATIONS: . amLODipine (NORVASC) 5 MG tablet  . apixaban (ELIQUIS) 5 MG TABS tablet  . benazepril (LOTENSIN) 40 MG tablet  . clomiPHENE (CLOMID) 50 MG tablet  . finasteride (PROSCAR) 5 MG tablet  . metFORMIN (GLUCOPHAGE-XR) 500 MG 24 hr tablet  . metoprolol succinate (TOPROL-XL) 25 MG 24 hr tablet  . propafenone (RYTHMOL SR) 425 MG 12 hr capsule   No current facility-administered medications for this encounter.     If labs acceptable day of surgery, I anticipate pt can proceed with surgery as scheduled.    Willeen Cass, FNP-BC [facility] Short Stay Surgical Center/Anesthesiology Phone: [phone] 06/21/2017 11:39 AM

## 2017-06-22 ENCOUNTER — Other Ambulatory Visit: Payer: Self-pay

## 2017-06-22 MED ORDER — BENAZEPRIL HCL 40 MG PO TABS: ORAL_TABLET | ORAL | 1 refills | Status: DC

## 2017-06-22 NOTE — Telephone Encounter (Signed)
Refill and 90 day supply request for Benazepril 40mg  #90

## 2017-06-26 NOTE — Anesthesia Preprocedure Evaluation (Addendum)
Anesthesia Evaluation  Patient identified by MRN, date of birth, ID band Patient awake    Reviewed: Allergy & Precautions, NPO status , Patient's Chart, lab work & pertinent test results  History of Anesthesia Complications (+) PROLONGED EMERGENCE  Airway Mallampati: II  TM Distance: >3 FB Neck ROM: Full    Dental  (+) Teeth Intact, Dental Advisory Given   Pulmonary sleep apnea ,    Pulmonary exam normal        Cardiovascular hypertension, Pt. on medications and Pt. on home beta blockers Normal cardiovascular exam+ dysrhythmias (s/p ablation) Atrial Fibrillation      Neuro/Psych negative neurological ROS     GI/Hepatic Neg liver ROS, GERD  ,  Endo/Other  diabetes, Type 2, Oral Hypoglycemic Agents  Renal/GU CRFRenal disease     Musculoskeletal   Abdominal   Peds  Hematology negative hematology ROS (+)   Anesthesia Other Findings   Reproductive/Obstetrics                           Lab Results  Component Value Date   WBC 7.8 06/20/2017   HGB 13.2 06/20/2017   HCT 39.5 06/20/2017   MCV 95.0 06/20/2017   PLT 149 (L) 06/20/2017   Lab Results  Component Value Date   CREATININE 1.21 06/20/2017   BUN 17 06/20/2017   NA 139 06/20/2017   K 4.2 06/20/2017   CL 102 06/20/2017   CO2 29 06/20/2017   Nuclear stress test 09/05/16 (care everywhere):  - Normal Lexiscan infusion EKG - Normal myocardial perfusion without evidence of myocardial ischemia  Echo 09/05/16 (care everywhere):  1.  Normal LV systolic function.  EF 55% 2.  Normal RV systolic function. 3.  Mild MR, mild TR, mild PR. 4.  Trivial AR. 5.  No valvular stenosis.   Anesthesia Physical Anesthesia Plan  ASA: II  Anesthesia Plan: General   Post-op Pain Management:    Induction: Intravenous  PONV Risk Score and Plan: 2 and Ondansetron, Dexamethasone and Treatment may vary due to age or medical condition  Airway  Management Planned: Oral ETT  Additional Equipment:   Intra-op Plan:   Post-operative Plan: Extubation in OR  Informed Consent: I have reviewed the patients History and Physical, chart, labs and discussed the procedure including the risks, benefits and alternatives for the proposed anesthesia with the patient or authorized representative who has indicated his/her understanding and acceptance.   Dental advisory given  Plan Discussed with: CRNA  Anesthesia Plan Comments:        Anesthesia Quick Evaluation

## 2017-06-27 ENCOUNTER — Encounter (HOSPITAL_COMMUNITY): Payer: Self-pay | Admitting: *Deleted

## 2017-06-27 ENCOUNTER — Inpatient Hospital Stay (HOSPITAL_COMMUNITY)
Admission: RE | Admit: 2017-06-27 | Discharge: 2017-07-02 | DRG: 454 | Disposition: A | Discharge status: Home Health | Payer: Medicare Other | Source: Ambulatory Visit | Attending: Neurosurgery | Admitting: Neurosurgery

## 2017-06-27 ENCOUNTER — Encounter (HOSPITAL_COMMUNITY)
Admission: RE | Disposition: A | Discharge status: Home Health | Payer: Self-pay | Source: Ambulatory Visit | Attending: Neurosurgery

## 2017-06-27 ENCOUNTER — Inpatient Hospital Stay (HOSPITAL_COMMUNITY): Payer: Medicare Other

## 2017-06-27 ENCOUNTER — Other Ambulatory Visit: Payer: Self-pay

## 2017-06-27 ENCOUNTER — Inpatient Hospital Stay (HOSPITAL_COMMUNITY): Payer: Medicare Other | Admitting: Emergency Medicine

## 2017-06-27 DIAGNOSIS — Z981 Arthrodesis status: Secondary | ICD-10-CM | POA: Diagnosis not present

## 2017-06-27 DIAGNOSIS — N183 Chronic kidney disease, stage 3 (moderate): Secondary | ICD-10-CM | POA: Diagnosis present

## 2017-06-27 DIAGNOSIS — M47817 Spondylosis without myelopathy or radiculopathy, lumbosacral region: Secondary | ICD-10-CM | POA: Diagnosis not present

## 2017-06-27 DIAGNOSIS — I951 Orthostatic hypotension: Secondary | ICD-10-CM | POA: Diagnosis not present

## 2017-06-27 DIAGNOSIS — Z7984 Long term (current) use of oral hypoglycemic drugs: Secondary | ICD-10-CM | POA: Diagnosis not present

## 2017-06-27 DIAGNOSIS — E1143 Type 2 diabetes mellitus with diabetic autonomic (poly)neuropathy: Secondary | ICD-10-CM | POA: Diagnosis not present

## 2017-06-27 DIAGNOSIS — M4156 Other secondary scoliosis, lumbar region: Secondary | ICD-10-CM | POA: Diagnosis present

## 2017-06-27 DIAGNOSIS — Z9889 Other specified postprocedural states: Secondary | ICD-10-CM

## 2017-06-27 DIAGNOSIS — M48062 Spinal stenosis, lumbar region with neurogenic claudication: Secondary | ICD-10-CM | POA: Diagnosis present

## 2017-06-27 DIAGNOSIS — Z96643 Presence of artificial hip joint, bilateral: Secondary | ICD-10-CM | POA: Diagnosis present

## 2017-06-27 DIAGNOSIS — Z7901 Long term (current) use of anticoagulants: Secondary | ICD-10-CM | POA: Diagnosis not present

## 2017-06-27 DIAGNOSIS — N401 Enlarged prostate with lower urinary tract symptoms: Secondary | ICD-10-CM | POA: Diagnosis present

## 2017-06-27 DIAGNOSIS — N138 Other obstructive and reflux uropathy: Secondary | ICD-10-CM | POA: Diagnosis not present

## 2017-06-27 DIAGNOSIS — R7989 Other specified abnormal findings of blood chemistry: Secondary | ICD-10-CM | POA: Diagnosis not present

## 2017-06-27 DIAGNOSIS — M48061 Spinal stenosis, lumbar region without neurogenic claudication: Principal | ICD-10-CM | POA: Diagnosis present

## 2017-06-27 DIAGNOSIS — I480 Paroxysmal atrial fibrillation: Secondary | ICD-10-CM | POA: Diagnosis present

## 2017-06-27 DIAGNOSIS — K219 Gastro-esophageal reflux disease without esophagitis: Secondary | ICD-10-CM | POA: Diagnosis present

## 2017-06-27 DIAGNOSIS — E785 Hyperlipidemia, unspecified: Secondary | ICD-10-CM | POA: Diagnosis present

## 2017-06-27 DIAGNOSIS — Z419 Encounter for procedure for purposes other than remedying health state, unspecified: Secondary | ICD-10-CM

## 2017-06-27 DIAGNOSIS — E782 Mixed hyperlipidemia: Secondary | ICD-10-CM | POA: Diagnosis not present

## 2017-06-27 DIAGNOSIS — E1122 Type 2 diabetes mellitus with diabetic chronic kidney disease: Secondary | ICD-10-CM | POA: Diagnosis present

## 2017-06-27 DIAGNOSIS — I4891 Unspecified atrial fibrillation: Secondary | ICD-10-CM | POA: Diagnosis present

## 2017-06-27 DIAGNOSIS — I129 Hypertensive chronic kidney disease with stage 1 through stage 4 chronic kidney disease, or unspecified chronic kidney disease: Secondary | ICD-10-CM | POA: Diagnosis present

## 2017-06-27 DIAGNOSIS — G4733 Obstructive sleep apnea (adult) (pediatric): Secondary | ICD-10-CM | POA: Diagnosis present

## 2017-06-27 DIAGNOSIS — E1169 Type 2 diabetes mellitus with other specified complication: Secondary | ICD-10-CM | POA: Diagnosis present

## 2017-06-27 DIAGNOSIS — M4186 Other forms of scoliosis, lumbar region: Secondary | ICD-10-CM | POA: Diagnosis not present

## 2017-06-27 DIAGNOSIS — G903 Multi-system degeneration of the autonomic nervous system: Secondary | ICD-10-CM | POA: Diagnosis not present

## 2017-06-27 DIAGNOSIS — Z79899 Other long term (current) drug therapy: Secondary | ICD-10-CM

## 2017-06-27 DIAGNOSIS — K5903 Drug induced constipation: Secondary | ICD-10-CM

## 2017-06-27 DIAGNOSIS — N400 Enlarged prostate without lower urinary tract symptoms: Secondary | ICD-10-CM | POA: Diagnosis present

## 2017-06-27 HISTORY — PX: ANTERIOR LAT LUMBAR FUSION: SHX1168

## 2017-06-27 LAB — GLUCOSE, CAPILLARY
Glucose-Capillary: 119 mg/dL — ABNORMAL HIGH (ref 65–99)
Glucose-Capillary: 166 mg/dL — ABNORMAL HIGH (ref 65–99)

## 2017-06-27 LAB — PROTIME-INR
INR: 1.09
Prothrombin Time: 14 seconds (ref 11.4–15.2)

## 2017-06-27 SURGERY — ANTERIOR LATERAL LUMBAR FUSION 2 LEVELS
Anesthesia: General | Site: Back

## 2017-06-27 MED ORDER — CHLORHEXIDINE GLUCONATE CLOTH 2 % EX PADS: 6.0000 | MEDICATED_PAD | Freq: Once | CUTANEOUS | Status: DC

## 2017-06-27 MED ORDER — METOPROLOL SUCCINATE ER 25 MG PO TB24
25.0000 mg | ORAL_TABLET | Freq: Two times a day (BID) | ORAL | Status: DC
  Administered 2017-06-27 – 2017-06-28 (×2): 25 mg via ORAL
  Filled 2017-06-27 (×3): qty 1

## 2017-06-27 MED ORDER — HYDRALAZINE HCL 20 MG/ML IJ SOLN
INTRAMUSCULAR | Status: AC
  Filled 2017-06-27: qty 1

## 2017-06-27 MED ORDER — CEFAZOLIN SODIUM-DEXTROSE 2-4 GM/100ML-% IV SOLN
2.0000 g | Freq: Three times a day (TID) | INTRAVENOUS | Status: AC
Start: 2017-06-27 — End: 2017-06-29
  Administered 2017-06-27 – 2017-06-29 (×5): 2 g via INTRAVENOUS
  Filled 2017-06-27 (×6): qty 100

## 2017-06-27 MED ORDER — ARTIFICIAL TEARS OPHTHALMIC OINT
TOPICAL_OINTMENT | OPHTHALMIC | Status: DC | PRN
  Administered 2017-06-27: 1 via OPHTHALMIC

## 2017-06-27 MED ORDER — DEXAMETHASONE SODIUM PHOSPHATE 10 MG/ML IJ SOLN
10.0000 mg | INTRAMUSCULAR | Status: AC
  Administered 2017-06-27: 10 mg via INTRAVENOUS
  Filled 2017-06-27: qty 1

## 2017-06-27 MED ORDER — ONDANSETRON HCL 4 MG/2ML IJ SOLN
INTRAMUSCULAR | Status: AC
  Filled 2017-06-27: qty 2

## 2017-06-27 MED ORDER — BUPIVACAINE HCL (PF) 0.25 % IJ SOLN
INTRAMUSCULAR | Status: DC | PRN
  Administered 2017-06-27: 5 mL

## 2017-06-27 MED ORDER — ROCURONIUM BROMIDE 50 MG/5ML IV SOLN
INTRAVENOUS | Status: AC
  Filled 2017-06-27: qty 1

## 2017-06-27 MED ORDER — SODIUM CHLORIDE 0.9 % IV SOLN
INTRAVENOUS | Status: DC | PRN
  Administered 2017-06-27 (×2)

## 2017-06-27 MED ORDER — SUGAMMADEX SODIUM 200 MG/2ML IV SOLN
INTRAVENOUS | Status: DC | PRN
  Administered 2017-06-27: 200 mg via INTRAVENOUS

## 2017-06-27 MED ORDER — CLOMIPHENE CITRATE 50 MG PO TABS
25.0000 mg | ORAL_TABLET | Freq: Every day | ORAL | Status: DC
  Administered 2017-06-27 – 2017-06-30 (×3): 25 mg via ORAL
  Filled 2017-06-27 (×4): qty 1

## 2017-06-27 MED ORDER — ROCURONIUM BROMIDE 100 MG/10ML IV SOLN
INTRAVENOUS | Status: DC | PRN
  Administered 2017-06-27 (×2): 5 mg via INTRAVENOUS
  Administered 2017-06-27 (×2): 10 mg via INTRAVENOUS
  Administered 2017-06-27: 50 mg via INTRAVENOUS

## 2017-06-27 MED ORDER — METFORMIN HCL ER 500 MG PO TB24
1000.0000 mg | ORAL_TABLET | Freq: Two times a day (BID) | ORAL | Status: DC
  Administered 2017-06-27 – 2017-07-02 (×10): 1000 mg via ORAL
  Filled 2017-06-27 (×11): qty 2

## 2017-06-27 MED ORDER — SODIUM CHLORIDE 0.9 % IV SOLN
250.0000 mL | INTRAVENOUS | Status: DC
  Administered 2017-06-27: 250 mL via INTRAVENOUS

## 2017-06-27 MED ORDER — PHENYLEPHRINE 40 MCG/ML (10ML) SYRINGE FOR IV PUSH (FOR BLOOD PRESSURE SUPPORT)
PREFILLED_SYRINGE | INTRAVENOUS | Status: DC | PRN
  Administered 2017-06-27 (×2): 80 ug via INTRAVENOUS

## 2017-06-27 MED ORDER — PROPOFOL 500 MG/50ML IV EMUL
INTRAVENOUS | Status: DC | PRN
  Administered 2017-06-27: 11:00:00 via INTRAVENOUS
  Administered 2017-06-27: 50 ug/kg/min via INTRAVENOUS

## 2017-06-27 MED ORDER — BUPIVACAINE LIPOSOME 1.3 % IJ SUSP
20.0000 mL | INTRAMUSCULAR | Status: AC
  Administered 2017-06-27: 20 mL
  Filled 2017-06-27: qty 20

## 2017-06-27 MED ORDER — PROPOFOL 1000 MG/100ML IV EMUL
INTRAVENOUS | Status: AC
  Filled 2017-06-27: qty 100

## 2017-06-27 MED ORDER — SODIUM CHLORIDE 0.9% FLUSH
3.0000 mL | Freq: Two times a day (BID) | INTRAVENOUS | Status: DC
  Administered 2017-06-27 – 2017-06-30 (×5): 3 mL via INTRAVENOUS

## 2017-06-27 MED ORDER — MENTHOL 3 MG MT LOZG: 1.0000 | LOZENGE | OROMUCOSAL | Status: DC | PRN

## 2017-06-27 MED ORDER — PHENOL 1.4 % MT LIQD: 1.0000 | OROMUCOSAL | Status: DC | PRN

## 2017-06-27 MED ORDER — CEFAZOLIN SODIUM 1 G IJ SOLR
INTRAMUSCULAR | Status: AC
  Filled 2017-06-27: qty 20

## 2017-06-27 MED ORDER — THROMBIN 20000 UNITS EX SOLR
CUTANEOUS | Status: AC
  Filled 2017-06-27: qty 20000

## 2017-06-27 MED ORDER — BUPIVACAINE HCL (PF) 0.25 % IJ SOLN
INTRAMUSCULAR | Status: AC
  Filled 2017-06-27: qty 30

## 2017-06-27 MED ORDER — SODIUM CHLORIDE 0.9 % IV SOLN: 1000.0000 mg | INTRAVENOUS | Status: DC

## 2017-06-27 MED ORDER — ALUM & MAG HYDROXIDE-SIMETH 200-200-20 MG/5ML PO SUSP: 30.0000 mL | Freq: Four times a day (QID) | ORAL | Status: DC | PRN

## 2017-06-27 MED ORDER — SUCCINYLCHOLINE CHLORIDE 200 MG/10ML IV SOSY
PREFILLED_SYRINGE | INTRAVENOUS | Status: AC
  Filled 2017-06-27: qty 10

## 2017-06-27 MED ORDER — ARTIFICIAL TEARS OPHTHALMIC OINT
TOPICAL_OINTMENT | OPHTHALMIC | Status: AC
  Filled 2017-06-27: qty 3.5

## 2017-06-27 MED ORDER — FINASTERIDE 5 MG PO TABS
5.0000 mg | ORAL_TABLET | Freq: Every day | ORAL | Status: DC
  Administered 2017-06-27 – 2017-07-01 (×5): 5 mg via ORAL
  Filled 2017-06-27 (×5): qty 1

## 2017-06-27 MED ORDER — PROPOFOL 10 MG/ML IV BOLUS
INTRAVENOUS | Status: AC
  Filled 2017-06-27: qty 20

## 2017-06-27 MED ORDER — CYCLOBENZAPRINE HCL 10 MG PO TABS
10.0000 mg | ORAL_TABLET | Freq: Three times a day (TID) | ORAL | Status: DC | PRN
  Administered 2017-06-28 – 2017-07-02 (×5): 10 mg via ORAL
  Filled 2017-06-27 (×5): qty 1

## 2017-06-27 MED ORDER — VANCOMYCIN HCL 1000 MG IV SOLR
INTRAVENOUS | Status: AC
  Filled 2017-06-27: qty 1000

## 2017-06-27 MED ORDER — ONDANSETRON HCL 4 MG/2ML IJ SOLN
INTRAMUSCULAR | Status: DC | PRN
  Administered 2017-06-27: 4 mg via INTRAVENOUS

## 2017-06-27 MED ORDER — LIDOCAINE-EPINEPHRINE 1 %-1:100000 IJ SOLN
INTRAMUSCULAR | Status: AC
  Filled 2017-06-27: qty 1

## 2017-06-27 MED ORDER — LIDOCAINE-EPINEPHRINE 1 %-1:100000 IJ SOLN
INTRAMUSCULAR | Status: DC | PRN
  Administered 2017-06-27: 5 mL

## 2017-06-27 MED ORDER — HYDROMORPHONE HCL 1 MG/ML IJ SOLN: 1.0000 mg | INTRAMUSCULAR | Status: DC | PRN

## 2017-06-27 MED ORDER — EPHEDRINE SULFATE 50 MG/ML IJ SOLN
INTRAMUSCULAR | Status: AC
  Filled 2017-06-27: qty 1

## 2017-06-27 MED ORDER — FENTANYL CITRATE (PF) 250 MCG/5ML IJ SOLN
INTRAMUSCULAR | Status: AC
  Filled 2017-06-27: qty 5

## 2017-06-27 MED ORDER — SUCCINYLCHOLINE CHLORIDE 200 MG/10ML IV SOSY
PREFILLED_SYRINGE | INTRAVENOUS | Status: DC | PRN
  Administered 2017-06-27: 100 mg via INTRAVENOUS

## 2017-06-27 MED ORDER — LIDOCAINE 2% (20 MG/ML) 5 ML SYRINGE
INTRAMUSCULAR | Status: AC
  Filled 2017-06-27: qty 5

## 2017-06-27 MED ORDER — LEVETIRACETAM IN NACL 1000 MG/100ML IV SOLN
1000.0000 mg | INTRAVENOUS | Status: DC
  Filled 2017-06-27: qty 100

## 2017-06-27 MED ORDER — ONDANSETRON HCL 4 MG PO TABS: 4.0000 mg | ORAL_TABLET | Freq: Four times a day (QID) | ORAL | Status: DC | PRN

## 2017-06-27 MED ORDER — PROPOFOL 10 MG/ML IV BOLUS
INTRAVENOUS | Status: DC | PRN
  Administered 2017-06-27: 200 mg via INTRAVENOUS
  Administered 2017-06-27: 20 mg via INTRAVENOUS

## 2017-06-27 MED ORDER — 0.9 % SODIUM CHLORIDE (POUR BTL) OPTIME
TOPICAL | Status: DC | PRN
Start: 2017-06-27 — End: 2017-06-27
  Administered 2017-06-27 (×2): 1000 mL

## 2017-06-27 MED ORDER — PANTOPRAZOLE SODIUM 40 MG PO TBEC
40.0000 mg | DELAYED_RELEASE_TABLET | Freq: Every day | ORAL | Status: DC
  Administered 2017-06-28 – 2017-07-01 (×4): 40 mg via ORAL
  Filled 2017-06-27 (×5): qty 1

## 2017-06-27 MED ORDER — BENAZEPRIL HCL 20 MG PO TABS
40.0000 mg | ORAL_TABLET | Freq: Every day | ORAL | Status: DC
  Filled 2017-06-27: qty 2

## 2017-06-27 MED ORDER — PROPAFENONE HCL ER 225 MG PO CP12
425.0000 mg | ORAL_CAPSULE | Freq: Two times a day (BID) | ORAL | Status: DC
  Administered 2017-06-27 – 2017-06-29 (×4): 450 mg via ORAL
  Filled 2017-06-27 (×4): qty 2
  Filled 2017-06-27: qty 1
  Filled 2017-06-27: qty 2

## 2017-06-27 MED ORDER — ONDANSETRON HCL 4 MG/2ML IJ SOLN
4.0000 mg | Freq: Once | INTRAMUSCULAR | Status: AC | PRN
  Administered 2017-06-27: 4 mg via INTRAVENOUS

## 2017-06-27 MED ORDER — VANCOMYCIN HCL 1 G IV SOLR
INTRAVENOUS | Status: DC | PRN
  Administered 2017-06-27: 1000 mg via TOPICAL

## 2017-06-27 MED ORDER — ACETAMINOPHEN 325 MG PO TABS
650.0000 mg | ORAL_TABLET | ORAL | Status: DC | PRN
  Administered 2017-07-01 – 2017-07-02 (×2): 650 mg via ORAL
  Filled 2017-06-27 (×2): qty 2

## 2017-06-27 MED ORDER — LACTATED RINGERS IV SOLN
INTRAVENOUS | Status: DC | PRN
  Administered 2017-06-27 (×2): via INTRAVENOUS

## 2017-06-27 MED ORDER — SUGAMMADEX SODIUM 200 MG/2ML IV SOLN
INTRAVENOUS | Status: AC
  Filled 2017-06-27: qty 2

## 2017-06-27 MED ORDER — OXYCODONE HCL 5 MG PO TABS
10.0000 mg | ORAL_TABLET | ORAL | Status: DC | PRN
  Administered 2017-06-28 – 2017-06-30 (×3): 10 mg via ORAL
  Filled 2017-06-27 (×3): qty 2

## 2017-06-27 MED ORDER — DEXAMETHASONE SODIUM PHOSPHATE 10 MG/ML IJ SOLN
INTRAMUSCULAR | Status: AC
  Filled 2017-06-27: qty 1

## 2017-06-27 MED ORDER — ACETAMINOPHEN 650 MG RE SUPP: 650.0000 mg | RECTAL | Status: DC | PRN

## 2017-06-27 MED ORDER — CEFAZOLIN SODIUM-DEXTROSE 2-4 GM/100ML-% IV SOLN
2.0000 g | INTRAVENOUS | Status: AC
  Administered 2017-06-27 (×2): 2 g via INTRAVENOUS
  Filled 2017-06-27: qty 100

## 2017-06-27 MED ORDER — FENTANYL CITRATE (PF) 250 MCG/5ML IJ SOLN
INTRAMUSCULAR | Status: DC | PRN
  Administered 2017-06-27: 50 ug via INTRAVENOUS
  Administered 2017-06-27: 25 ug via INTRAVENOUS
  Administered 2017-06-27 (×2): 50 ug via INTRAVENOUS
  Administered 2017-06-27 (×7): 25 ug via INTRAVENOUS
  Administered 2017-06-27: 100 ug via INTRAVENOUS

## 2017-06-27 MED ORDER — SODIUM CHLORIDE 0.9% FLUSH: 3.0000 mL | INTRAVENOUS | Status: DC | PRN

## 2017-06-27 MED ORDER — ONDANSETRON HCL 4 MG/2ML IJ SOLN
4.0000 mg | Freq: Four times a day (QID) | INTRAMUSCULAR | Status: DC | PRN
  Administered 2017-06-28: 4 mg via INTRAVENOUS
  Filled 2017-06-27: qty 2

## 2017-06-27 MED ORDER — MIDAZOLAM HCL 2 MG/2ML IJ SOLN
INTRAMUSCULAR | Status: AC
  Filled 2017-06-27: qty 2

## 2017-06-27 MED ORDER — PHENYLEPHRINE HCL 10 MG/ML IJ SOLN
INTRAVENOUS | Status: DC | PRN
  Administered 2017-06-27: 20 ug/min via INTRAVENOUS

## 2017-06-27 MED ORDER — HYDROMORPHONE HCL 2 MG/ML IJ SOLN: 0.3000 mg | INTRAMUSCULAR | Status: DC | PRN

## 2017-06-27 MED ORDER — LACTATED RINGERS IV SOLN
INTRAVENOUS | Status: DC | PRN
  Administered 2017-06-27 (×2): via INTRAVENOUS

## 2017-06-27 MED ORDER — THROMBIN (RECOMBINANT) 20000 UNITS EX SOLR
CUTANEOUS | Status: DC | PRN
  Administered 2017-06-27: 10:00:00 via TOPICAL

## 2017-06-27 MED ORDER — PHENYLEPHRINE 40 MCG/ML (10ML) SYRINGE FOR IV PUSH (FOR BLOOD PRESSURE SUPPORT)
PREFILLED_SYRINGE | INTRAVENOUS | Status: AC
  Filled 2017-06-27: qty 10

## 2017-06-27 SURGICAL SUPPLY — 86 items
BAG DECANTER FOR FLEXI CONT (MISCELLANEOUS) ×6 IMPLANT
BASKET BONE COLLECTION (BASKET) ×3 IMPLANT
BENZOIN TINCTURE PRP APPL 2/3 (GAUZE/BANDAGES/DRESSINGS) ×3 IMPLANT
BLADE SURG 11 STRL SS (BLADE) ×3 IMPLANT
BONE MATRIX OSTEOCEL PRO MED (Bone Implant) ×6 IMPLANT
BONE VIVIGEN FORMABLE 10CC (Bone Implant) ×3 IMPLANT
BUR CUTTER 7.0 ROUND (BURR) ×3 IMPLANT
BUR MATCHSTICK NEURO 3.0 LAGG (BURR) ×3 IMPLANT
CAGE ALTERA 10X31X10-14 15D (Cage) ×3 IMPLANT
CAGE MODULUS XL 12X18X55 - 10 (Cage) ×3 IMPLANT
CANISTER SUCT 3000ML PPV (MISCELLANEOUS) ×3 IMPLANT
CAP LOCKING (Cap) ×8 IMPLANT
CAP LOCKING 5.5 CREO (Cap) ×16 IMPLANT
CARTRIDGE OIL MAESTRO DRILL (MISCELLANEOUS) ×2 IMPLANT
CONT SPEC 4OZ CLIKSEAL STRL BL (MISCELLANEOUS) ×3 IMPLANT
COVER BACK TABLE 60X90IN (DRAPES) ×3 IMPLANT
DECANTER SPIKE VIAL GLASS SM (MISCELLANEOUS) ×3 IMPLANT
DERMABOND ADVANCED (GAUZE/BANDAGES/DRESSINGS) ×2
DERMABOND ADVANCED .7 DNX12 (GAUZE/BANDAGES/DRESSINGS) ×4 IMPLANT
DIFFUSER DRILL AIR PNEUMATIC (MISCELLANEOUS) ×3 IMPLANT
DRAPE C-ARM 42X72 X-RAY (DRAPES) ×6 IMPLANT
DRAPE C-ARMOR (DRAPES) ×6 IMPLANT
DRAPE HALF SHEET 40X57 (DRAPES) IMPLANT
DRAPE LAPAROTOMY 100X72X124 (DRAPES) ×6 IMPLANT
DRAPE SURG 17X23 STRL (DRAPES) IMPLANT
DRSG OPSITE 4X5.5 SM (GAUZE/BANDAGES/DRESSINGS) ×3 IMPLANT
DRSG OPSITE POSTOP 3X4 (GAUZE/BANDAGES/DRESSINGS) ×3 IMPLANT
DRSG OPSITE POSTOP 4X6 (GAUZE/BANDAGES/DRESSINGS) ×3 IMPLANT
DRSG OPSITE POSTOP 4X8 (GAUZE/BANDAGES/DRESSINGS) ×3 IMPLANT
DURAPREP 26ML APPLICATOR (WOUND CARE) ×6 IMPLANT
ELECT REM PT RETURN 9FT ADLT (ELECTROSURGICAL) ×6
ELECTRODE REM PT RTRN 9FT ADLT (ELECTROSURGICAL) ×4 IMPLANT
EVACUATOR 3/16  PVC DRAIN (DRAIN)
EVACUATOR 3/16 PVC DRAIN (DRAIN) IMPLANT
FIBER BOAT ALLOFUSE 15CC (Bone Implant) ×3 IMPLANT
GAUZE SPONGE 4X4 12PLY STRL (GAUZE/BANDAGES/DRESSINGS) ×3 IMPLANT
GAUZE SPONGE 4X4 16PLY XRAY LF (GAUZE/BANDAGES/DRESSINGS) ×3 IMPLANT
GLOVE BIO SURGEON STRL SZ7 (GLOVE) ×3 IMPLANT
GLOVE BIO SURGEON STRL SZ8 (GLOVE) ×9 IMPLANT
GLOVE BIOGEL PI IND STRL 7.0 (GLOVE) ×2 IMPLANT
GLOVE BIOGEL PI IND STRL 7.5 (GLOVE) ×6 IMPLANT
GLOVE BIOGEL PI INDICATOR 7.0 (GLOVE) ×1
GLOVE BIOGEL PI INDICATOR 7.5 (GLOVE) ×3
GLOVE ECLIPSE 7.5 STRL STRAW (GLOVE) IMPLANT
GLOVE INDICATOR 8.5 STRL (GLOVE) ×9 IMPLANT
GOWN STRL REUS W/ TWL LRG LVL3 (GOWN DISPOSABLE) ×4 IMPLANT
GOWN STRL REUS W/ TWL XL LVL3 (GOWN DISPOSABLE) ×10 IMPLANT
GOWN STRL REUS W/TWL 2XL LVL3 (GOWN DISPOSABLE) IMPLANT
GOWN STRL REUS W/TWL LRG LVL3 (GOWN DISPOSABLE) ×2
GOWN STRL REUS W/TWL XL LVL3 (GOWN DISPOSABLE) ×5
HEMOSTAT POWDER KIT SURGIFOAM (HEMOSTASIS) IMPLANT
KIT BASIN OR (CUSTOM PROCEDURE TRAY) ×6 IMPLANT
KIT DILATOR XLIF 5 (KITS) ×3 IMPLANT
KIT INFUSE X SMALL 1.4CC (Orthopedic Implant) ×3 IMPLANT
KIT SURGICAL ACCESS MAXCESS 4 (KITS) ×3 IMPLANT
KIT TURNOVER KIT B (KITS) ×6 IMPLANT
MILL MEDIUM DISP (BLADE) ×6 IMPLANT
MODULE EMG NEEDLE SSEP NVM5 (NEEDLE) ×3 IMPLANT
MODULE NVM5 NEXT GEN EMG (NEEDLE) ×3 IMPLANT
MODULUS XLW 12X22X50MM 10DEG (Spine Construct) ×3 IMPLANT
NEEDLE HYPO 21X1.5 SAFETY (NEEDLE) ×3 IMPLANT
NEEDLE HYPO 22GX1.5 SAFETY (NEEDLE) ×3 IMPLANT
NEEDLE HYPO 25X1 1.5 SAFETY (NEEDLE) ×3 IMPLANT
NS IRRIG 1000ML POUR BTL (IV SOLUTION) ×9 IMPLANT
OIL CARTRIDGE MAESTRO DRILL (MISCELLANEOUS) ×3
PACK LAMINECTOMY NEURO (CUSTOM PROCEDURE TRAY) ×6 IMPLANT
PAD ARMBOARD 7.5X6 YLW CONV (MISCELLANEOUS) ×15 IMPLANT
ROD 100MM (Rod) ×1 IMPLANT
ROD CREO 100MM SPINAL (Rod) ×3 IMPLANT
ROD SPNL STRG 100X5.5XNS (Rod) ×2 IMPLANT
SCREW MOD 6.0-5.0X35MM (Screw) ×18 IMPLANT
SHAFT CREO 30MM (Neuro Prosthesis/Implant) ×6 IMPLANT
SPONGE SURGIFOAM ABS GEL 100 (HEMOSTASIS) ×3 IMPLANT
STRIP CLOSURE SKIN 1/2X4 (GAUZE/BANDAGES/DRESSINGS) ×3 IMPLANT
SUT VIC AB 0 CT1 18XCR BRD8 (SUTURE) ×4 IMPLANT
SUT VIC AB 0 CT1 8-18 (SUTURE) ×2
SUT VIC AB 2-0 CT1 18 (SUTURE) ×6 IMPLANT
SUT VIC AB 4-0 PS2 27 (SUTURE) ×3 IMPLANT
SUT VICRYL 4-0 PS2 18IN ABS (SUTURE) ×3 IMPLANT
SWABSTICK BENZOIN STERILE (MISCELLANEOUS) ×3 IMPLANT
SYRINGE 20CC LL (MISCELLANEOUS) ×3 IMPLANT
TOWEL GREEN STERILE (TOWEL DISPOSABLE) ×6 IMPLANT
TOWEL GREEN STERILE FF (TOWEL DISPOSABLE) ×3 IMPLANT
TRAY FOLEY MTR SLVR 16FR STAT (SET/KITS/TRAYS/PACK) ×3 IMPLANT
TULIP CREP AMP 5.5MM (Orthopedic Implant) ×24 IMPLANT
WATER STERILE IRR 1000ML POUR (IV SOLUTION) ×6 IMPLANT

## 2017-06-27 NOTE — Op Note (Signed)
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  Postoperative diagnosis: Same  Procedure: #1 anterolateral interbody fusion L2-3 L3-4 utilizing intraoperative neural monitoring and the new invasive XLIF system with titanium interbody cages 12 mm tall 50 mm in length and 10 lordosis cages packed with Osteocel pro  #2 through a separate skin incision after repositioning prone on a Jackson table transforaminal lumbar interbody fusion L4-5 utilizing the globus healthcare expandable cage system packed with locally harvested autograft mixed with BMP and vivigen  #3 cortical screw fixation L2-L5 utilizing the globus creole modular cortical screw set  #4 posterior lateral arthrodesis L2-L5 utilizing locally harvested autograft mix with BMP and cortical fiber boats  5 open reduction spinal deformity L2-L5  Surgeon: Dominica Severin Taeshawn Helfman  Asst.: Nash Shearer for stage I anterior lateral interbody fusion and  Dr. Dayton Bailiff for stage II posterior lumbar transforaminal interbody fusion L2-L5 cortical screw fixation with posterior lateral arthrodesis  Anesthesia: Gen.  EBL: Minimal  History of present illness: 77 year old gentleman with long-standing back pain status post laminotomy at L3-4 and progress worsening pain refractory to all forms conservative treatment inflammatories physical therapy epidural steroid injections. Workup revealed severe spinal stenosis at L23 L3-4 L4-5 with marked general collapse and degenerative scoliosis primarily at L3-4 and L4-5 and also to some degree at L2-3. Due to patient progressive clinical syndrome an findings and failure conservative treatment I recommended anterior lateral interbody fusions at L2-3 and L3-4 and posterior transforaminal interbody fusion at L4-5 with cortical screw augmentation fixation L2 L5. I extensively went over the risks and benefits of the procedure with him as well as perioperative course  expectations of outcome and alternatives of surgery and he understands and agrees to proceed forward.  Operative procedure: Patient brought into the or was induced on general anesthesia positioned right side up the right side of his lateral abdominal wall and back was prepped and draped he was taped in place and positioned under fluoroscopy with intraoperative neural monitoring with no muscle relaxation. Then utilizing the 2 incision technique I localized the entry point for L2-3 and L3-4 and a posterior incision again the access to the retropectoral space. First working at L2-3 I incised the L2-3 incision and the posterior incision identified the transverse process with my finger up creating a channel to passed the dilator down through the so as on the T3 disc space. Identify the docking point with fluoroscopy. Then utilizing sequential vasodilation with neural monitoring at each step along the way with no levels less than 20.the dilators a Steinmann pin and the retractor in place. Then incised the disc space clean the disc space out radically with pituitary rongeurs and Cobbs scrapers prepare the endplates H step was done with intraoperative fluoroscopy and intraoperative neural monitoring. Then utilizing paddles and trials I selected a 12 mm tall titanium cage 10 lordosis and 45 mm in width for the T3 disc space packed in place and fluoroscopy in good position. I then removed the retractor system packed this incision made new incision at L3-4. In a similar fashion past the dilators down to the psoas at L3-4 had to Dr. very anterior and disc space secondary to monitoring shown me some levels that were in the red zone less than 4 and 5. Sewed to the dilator out of the so as repositioned in the anterior one half to anterior one third and get adequate readings over 20 and then place a Steinmann pin the dilators placed retractor and then  with neural monitoring continuously monitoring the posterior blade transition  the retractor back posteriorly to the 50 yard line and create enough space to place an 18 mm cage. Then a similar fashion I incised the disc cleaned out the disc prepare the endplates and inserted a 12 mm tall 50 mm length this time 10 lordosis titanium cage packed with Osteocel pro. After fluoroscopy confirmed good position of the cages there is incisions were closed with interrupted with interrupted Vicryl and a running 4 subcuticular. Patient was then repositioned prone the [facility] table for the posterior aspect case.   After adequately repositioning the patient prone on [place] table as active incision was shaved prepped and draped in routine sterile fashion and infiltrated with 10 mL lidocaine with epi and opened up. Subperiosteal dissections care of the lamina of L2 down L5. Interoperative x-ray confirmed the location appropriate level. I then performed bilateral laminotomies preserving the spinous process and interspinous ligament with complete facetectomies. There was marked collapse especially in the right side overlying the disc space with a large spur coming off the facet joint. This is all aggressively under bitten and aggressive foraminotomies were carried out with complete facetectomies and aggressively under biting the superior tickling facet. Then the space was cleaned out bilaterally sequentially distracted and then with an 11 distractor in place on the patient's right side I prepare the endplates for a transforaminal interbody L Tara Cage replacement left. So I packed dense amount of BMP and autograft mixed anteriorly then selected a 10-14 expandable [place] cage packed at pain inserted at kicked it across midline and expanded the cage. Fluoroscopy was used the step along the way. Then I packed some additional bone graft posterior to the cage and lateral to the cage bilaterally. Then Gelfoam was overlaid over the dura although foramina had been reinspected to confirm patency. Then utilizing AP  and lateral fluoroscopy cortical screws were placed at L2, L3, L4 and L5 bilaterally. Posterior fluoroscopy confirmed good position of all screws. Then I selected 2 rods contoured and anchored in place with top tightening nuts and reinspected the foramen to confirm patency aggressively decorticated the lateral pars lamina and facet joints from L2-L5 and packed the remainder of the local autograft mixed with BMP and cortical fiber boats posterior laterally from L2-L5. These were packed along the lamina facet joints and pars. Then sprinkled vancomycin powder and the wound injected X Brown the fascia laid a medium Hemovac drain and closed the wound in layers with interrupted Vicryl and a running 4 subcuticular Dermabond benzo and Steri-Strips and sterile dressing was applied patient recovered in stable condition. At the end the case all needle counts sponge counts were correct.

## 2017-06-27 NOTE — Transfer of Care (Signed)
Immediate Anesthesia Transfer of Care Note  Patient: Sean Reyes  Procedure(s) Performed: Anterior Lateral Lumbar Interbody  Fusion - Lumbar Two-Lumbar Three - Lumbar Three-Lumbar Four, Posterior Lumbar Interbody Fusion Lumbar Four- Five (N/A ) POSTERIOR LUMBAR FUSION 1 LEVEL (N/A Back)  Patient Location: PACU  Anesthesia Type:General  Level of Consciousness: awake and alert   Airway & Oxygen Therapy: Patient Spontanous Breathing and Patient connected to face mask oxygen  Post-op Assessment: Report given to RN, Post -op Vital signs reviewed and stable and Patient moving all extremities X 4  Post vital signs: Reviewed and stable  Last Vitals:  Vitals Value Taken Time  BP 158/89 06/27/2017  4:34 PM  Temp 36.4 C 06/27/2017  4:34 PM  Pulse 54 06/27/2017  4:35 PM  Resp 12 06/27/2017  4:35 PM  SpO2 100 % 06/27/2017  4:35 PM  Vitals shown include unvalidated device data.  Last Pain:  Vitals:   06/27/17 0651  TempSrc:   PainSc: 0-No pain         Complications: No apparent anesthesia complications

## 2017-06-27 NOTE — Anesthesia Procedure Notes (Signed)
Procedure Name: Intubation Date/Time: 06/27/2017 8:43 AM Performed by: Harden Mo, CRNA Pre-anesthesia Checklist: Patient identified, Emergency Drugs available, Suction available, Patient being monitored and Timeout performed Patient Re-evaluated:Patient Re-evaluated prior to induction Oxygen Delivery Method: Circle system utilized Preoxygenation: Pre-oxygenation with 100% oxygen Induction Type: IV induction Ventilation: Mask ventilation without difficulty Laryngoscope Size: Miller and 3 Grade View: Grade I Tube type: Oral Tube size: 8.0 mm Number of attempts: 1 Airway Equipment and Method: Stylet Placement Confirmation: ETT inserted through vocal cords under direct vision,  positive ETCO2 and breath sounds checked- equal and bilateral Secured at: 23 cm Tube secured with: Tape Dental Injury: Teeth and Oropharynx as per pre-operative assessment  Comments: Performed by Garner Gavel SRNA under direct supervision

## 2017-06-27 NOTE — H&P (Signed)
Sean Reyes is an 77 y.o. male.   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 Patient had previously undergone DUE TO THE PATIENT"S PROGRESSIVE CLINICAL SYNDROME IMAGING  FINDINGS AND FAILURE CONSERVATIVE TREATMENT I recommended anterior lateral interbody fusions at L2-3 and L3-4 with posterior lumbar interbody fusionsat L4-5  And cortical screw fixation from 2-5.I have extensively gone over the risks  And benefits of that operation with him as well as perioperative course expectations of outcome and alternatives to surgery and he understands and agrees to proceed Forward.  Past Medical History:  Diagnosis Date  . Anemia   . Anxiety   . Arthritis   . Atrial fibrillation ([place])   . Complication of anesthesia    pt reports low BP's after surgery at [facility] and difficulty awakening  . Depression   . Diabetes ([place])   . Dysrhythmia   . GERD (gastroesophageal reflux disease)    OCC TAKES ALKA SELTZER  . Hyperlipidemia   . Hypertension   . Nocturia   . S/P ablation of atrial fibrillation    Ablative therapy  . Sleep apnea    CPAP   . Tachycardia, unspecified     Past Surgical History:  Procedure Laterality Date  . ABLATION    . COLONOSCOPY WITH PROPOFOL N/A 10/05/2015   Procedure: COLONOSCOPY WITH PROPOFOL;  Surgeon: Lollie Sails, MD;  Location: [facility] ENDOSCOPY;  Service: Endoscopy;  Laterality: N/A;  . HERNIA REPAIR    . JOINT REPLACEMENT Bilateral    hips  RT+  LEFT X2   . LUMBAR LAMINECTOMY/DECOMPRESSION MICRODISCECTOMY Left 09/13/2016   Procedure: Microdiscectomy - Lumbar two-three,  Lumbar three- - left;  Surgeon: Kary Kos, MD;  Location: [place];  Service: Neurosurgery;  Laterality: Left;  . TONSILLECTOMY      Family History  Problem Relation Age of Onset  . Brain cancer  Mother   . Kidney disease Neg Hx   . Prostate cancer Neg Hx   . Kidney cancer Neg Hx   . Bladder Cancer Neg Hx    Social History:  reports that he has never smoked. He has never used smokeless tobacco. He reports that he does not drink alcohol or use drugs.  Allergies:  Allergies  Allergen Reactions  . Levaquin [Levofloxacin In D5w] Anaphylaxis and Shortness Of Breath  . Shellfish Allergy Anaphylaxis  . Amiodarone Other (See Comments)    Tremors and thyroid toxicity    Medications Prior to Admission  Medication Sig Dispense Refill  . apixaban (ELIQUIS) 5 MG TABS tablet Take 1 tablet (5 mg total) by mouth 2 (two) times daily. 60 tablet 6  . benazepril (LOTENSIN) 40 MG tablet Take 1 tablet (40 mg total) by mouth daily. 90 tablet 1  . clomiPHENE (CLOMID) 50 MG tablet 1/2 tablet daily (Patient taking differently: Take 25 mg by mouth at bedtime. ) 30 tablet 3  . finasteride (PROSCAR) 5 MG tablet Take 1 tablet (5 mg total) by mouth daily. (Patient taking differently: Take 5 mg by mouth at bedtime. ) 90 tablet 3  . metFORMIN (GLUCOPHAGE-XR) 500 MG 24 hr tablet 2 tabs in AM and PM (Patient taking differently: Take 1,000 mg by mouth 2 (two) times daily. ) 120 tablet 12  . metoprolol succinate (TOPROL-XL) 25 MG 24 hr tablet Take 1 tablet (25 mg total) by mouth 2 (two)  times daily. 60 tablet 12  . propafenone (RYTHMOL SR) 425 MG 12 hr capsule Take 425 mg by mouth 2 (two) times daily.    [name] amLODipine (NORVASC) 5 MG tablet Take 1 tablet (5 mg total) by mouth daily. (Patient not taking: Reported on 06/14/2017) 30 tablet 2    No results found for this or any previous visit (from the past 48 hour(s)). No results found.  Review of Systems  Musculoskeletal: Positive for back pain and joint pain.  Neurological: Positive for tingling and sensory change.    Pulse (!) 57, temperature 97.6 F (36.4 C), temperature source Oral, resp. rate 18, height 6\' 1"  (1.854 m), weight 88.3 kg (194 lb 11.2 oz), SpO2  100 %. Physical Exam  Constitutional: He is oriented to person, place, and time. He appears well-developed.  HENT:  Head: Normocephalic.  Eyes: Pupils are equal, round, and reactive to light.  Neck: Normal range of motion.  Cardiovascular: Normal rate.  Respiratory: Effort normal.  GI: Soft.  Musculoskeletal: Normal range of motion.  Neurological: He is alert and oriented to person, place, and time. He has normal strength. GCS eye subscore is 4. GCS verbal subscore is 5. GCS motor subscore is 6.  Strength 5 out of 5 iliopsoas, quads, hamstrings, gastrocs, anterior tibialis and EHL.  Skin: Skin is warm and dry.     Assessment/Plan 77 year old gentleman presents for anterior lateral interbody fusion L2-3 and L and posterior lumbar interbody fusion L4-5-4 5with cortical screw fixation L2-L5 Garrin Kirwan P, MD 06/27/2017, 7:48 AM

## 2017-06-27 NOTE — Anesthesia Postprocedure Evaluation (Signed)
Anesthesia Post Note  Patient: Sean Reyes  Procedure(s) Performed: Anterior Lateral Lumbar Interbody  Fusion - Lumbar Two-Lumbar Three - Lumbar Three-Lumbar Four, Posterior Lumbar Interbody Fusion Lumbar Four- Five (N/A ) POSTERIOR LUMBAR FUSION 1 LEVEL (N/A Back)     Patient location during evaluation: PACU Anesthesia Type: General Level of consciousness: awake and alert Pain management: pain level controlled Vital Signs Assessment: post-procedure vital signs reviewed and stable Respiratory status: spontaneous breathing, nonlabored ventilation, respiratory function stable and patient connected to nasal cannula oxygen Cardiovascular status: blood pressure returned to baseline and stable Postop Assessment: no apparent nausea or vomiting Anesthetic complications: no    Last Vitals:  Vitals:   06/27/17 1749 06/27/17 1817  BP: 135/77 (!) 142/77  Pulse:    Resp:    Temp:  (!) 36.1 C  SpO2:      Last Pain:  Vitals:   06/27/17 1817  TempSrc: Axillary  PainSc:                  Tiajuana Amass

## 2017-06-28 ENCOUNTER — Encounter (HOSPITAL_COMMUNITY): Payer: Self-pay | Admitting: Neurosurgery

## 2017-06-28 LAB — CBC
HCT: 30 % — ABNORMAL LOW (ref 39.0–52.0)
Hemoglobin: 10 g/dL — ABNORMAL LOW (ref 13.0–17.0)
MCH: 31.7 pg (ref 26.0–34.0)
MCHC: 33.3 g/dL (ref 30.0–36.0)
MCV: 95.2 fL (ref 78.0–100.0)
Platelets: 129 10*3/uL — ABNORMAL LOW (ref 150–400)
RBC: 3.15 MIL/uL — ABNORMAL LOW (ref 4.22–5.81)
RDW: 13.3 % (ref 11.5–15.5)
WBC: 11.8 10*3/uL — ABNORMAL HIGH (ref 4.0–10.5)

## 2017-06-28 LAB — GLUCOSE, CAPILLARY
Glucose-Capillary: 189 mg/dL — ABNORMAL HIGH (ref 65–99)
Glucose-Capillary: 204 mg/dL — ABNORMAL HIGH (ref 65–99)
Glucose-Capillary: 248 mg/dL — ABNORMAL HIGH (ref 65–99)
Glucose-Capillary: 228 mg/dL — ABNORMAL HIGH (ref 65–99)
Glucose-Capillary: 194 mg/dL — ABNORMAL HIGH (ref 65–99)

## 2017-06-28 LAB — BASIC METABOLIC PANEL
Anion gap: 11 (ref 5–15)
BUN: 22 mg/dL — ABNORMAL HIGH (ref 6–20)
CO2: 26 mmol/L (ref 22–32)
Calcium: 7.7 mg/dL — ABNORMAL LOW (ref 8.9–10.3)
Chloride: 101 mmol/L (ref 101–111)
Creatinine, Ser: 1.55 mg/dL — ABNORMAL HIGH (ref 0.61–1.24)
GFR calc Af Amer: 49 mL/min — ABNORMAL LOW (ref >60)
GFR calc non Af Amer: 42 mL/min — ABNORMAL LOW (ref >60)
Glucose, Bld: 220 mg/dL — ABNORMAL HIGH (ref 65–99)
Potassium: 4.1 mmol/L (ref 3.5–5.1)
Sodium: 138 mmol/L (ref 135–145)

## 2017-06-28 MED ORDER — SODIUM CHLORIDE 0.9 % IV BOLUS
1000.0000 mL | Freq: Once | INTRAVENOUS | Status: AC
  Administered 2017-06-28: 1000 mL via INTRAVENOUS

## 2017-06-28 MED ORDER — INSULIN ASPART 100 UNIT/ML ~~LOC~~ SOLN
0.0000 [IU] | Freq: Every day | SUBCUTANEOUS | Status: DC
Start: 2017-06-28 — End: 2017-07-02

## 2017-06-28 MED ORDER — INSULIN ASPART 100 UNIT/ML ~~LOC~~ SOLN
0.0000 [IU] | Freq: Three times a day (TID) | SUBCUTANEOUS | Status: DC
  Administered 2017-06-28: 3 [IU] via SUBCUTANEOUS
  Administered 2017-06-29: 2 [IU] via SUBCUTANEOUS
  Administered 2017-06-29: 3 [IU] via SUBCUTANEOUS
  Administered 2017-06-29 – 2017-06-30 (×2): 2 [IU] via SUBCUTANEOUS
  Administered 2017-06-30: 1 [IU] via SUBCUTANEOUS
  Administered 2017-06-30: 2 [IU] via SUBCUTANEOUS
  Administered 2017-07-01: 3 [IU] via SUBCUTANEOUS
  Administered 2017-07-01: 1 [IU] via SUBCUTANEOUS
  Administered 2017-07-01: 2 [IU] via SUBCUTANEOUS
  Administered 2017-07-02: 1 [IU] via SUBCUTANEOUS

## 2017-06-28 MED FILL — Thrombin For Soln 20000 Unit: CUTANEOUS | Qty: 1 | Status: AC

## 2017-06-28 NOTE — Progress Notes (Signed)
Paged on call MD in regards of pts blood sugar continuously rising. Mentioned the recommendation of diabetes coordinator of adding Novolog SENSITIVE correction scale TID & HS while in the hospital. MD agreeable to recommendation. Will follow through with orders.

## 2017-06-28 NOTE — Progress Notes (Signed)
Results for JOMES, GIRALDO (MRN [id number]) as of 06/28/2017 15:03  Ref. Range 06/27/2017 06:41 06/27/2017 16:36 06/28/2017 09:28 06/28/2017 12:30  Glucose-Capillary Latest Ref Range: 65 - 99 mg/dL 119 (H) 166 (H) 189 (H) 204 (H)  Noted that blood sugars continue to be greater than 180 mg/dl. Recommend adding Novolog SENSITIVE correction scale TID & HS while in the hospital.   Harvel Ricks RN BSN CDE Diabetes Coordinator Pager: [phone]  8am-5pm

## 2017-06-28 NOTE — Evaluation (Signed)
Physical Therapy Evaluation Patient Details Name: Sean Reyes MRN: [id number] DOB: December 03, 1940 Today's Date: 06/28/2017   History of Present Illness   Mr. Tiberio is s/p L2-3 L3-4, L4-5 fusion. PMHx: Bil hip sxs, lumbar laminectomy/disectomy  Clinical Impression  Patient presents with decreased independence with mobility due to pain, limited activity tolerance, decreased LE strength, decreased knowledge of precautions and he will benefit from skilled PT in the acute setting to allow return home with family support and follow up HHPT.     Follow Up Recommendations Home health PT    Equipment Recommendations  None recommended by PT    Recommendations for Other Services       Precautions / Restrictions Precautions Precautions: Back Precaution Booklet Issued: No Precaution Comments: recalled 2/3 precautions Required Braces or Orthoses: Spinal Brace Spinal Brace: Applied in sitting position      Mobility  Bed Mobility Overal bed mobility: Needs Assistance Bed Mobility: Rolling;Sidelying to Sit;Sit to Sidelying Rolling: Mod assist Sidelying to sit: Mod assist     Sit to sidelying: Mod assist General bed mobility comments: assist for log roll technique with pad under hip and cues to use railing, assist for trunk upright, assist for legs into bed  Transfers Overall transfer level: Needs assistance Equipment used: Rolling walker (2 wheeled) Transfers: Sit to/from Stand Sit to Stand: From elevated surface;Min assist         General transfer comment: cues for hand placement, assist for balance  Ambulation/Gait Ambulation/Gait assistance: Min assist Ambulation Distance (Feet): 80 Feet Assistive device: Rolling walker (2 wheeled) Gait Pattern/deviations: Step-through pattern;Decreased stride length;Antalgic;Trunk flexed     General Gait Details: painful at times with R LE Progression  Stairs            Wheelchair Mobility    Modified Rankin (Stroke Patients  Only)       Balance Overall balance assessment: Needs assistance Sitting-balance support: Feet supported Sitting balance-Leahy Scale: Fair     Standing balance support: Bilateral upper extremity supported Standing balance-Leahy Scale: Poor Standing balance comment: reliant on RW                             Pertinent Vitals/Pain Pain Score: 8  Pain Location: R hip with ambulation at times Pain Descriptors / Indicators: Sharp;Sore Pain Intervention(s): Monitored during session;Repositioned    Home Living Family/patient expects to be discharged to:: Private residence Living Arrangements: Spouse/significant other;Children Available Help at Discharge: Family;Available 24 hours/day Type of Home: House Home Access: Stairs to enter Entrance Stairs-Rails: Right;Left;Can reach both Entrance Stairs-Number of Steps: 2 Home Layout: One level Home Equipment: Shower seat - built in;Walker - 2 wheels;Hand held shower head;Grab bars - tub/shower      Prior Function Level of Independence: Independent               Hand Dominance   Dominant Hand: Right    Extremity/Trunk Assessment   Upper Extremity Assessment Upper Extremity Assessment: Defer to OT evaluation    Lower Extremity Assessment Lower Extremity Assessment: RLE deficits/detail RLE Deficits / Details: difficulty moving into hip flexion unaided, able to extend knee grossly 3+/5       Communication   Communication: No difficulties  Cognition Arousal/Alertness: Awake/alert Behavior During Therapy: WFL for tasks assessed/performed Overall Cognitive Status: Within Functional Limits for tasks assessed  General Comments General comments (skin integrity, edema, etc.): wife in the room and supportive    Exercises     Assessment/Plan    PT Assessment Patient needs continued PT services  PT Problem List Decreased strength;Decreased  mobility;Decreased activity tolerance;Decreased balance;Decreased knowledge of use of DME;Pain;Decreased knowledge of precautions       PT Treatment Interventions DME instruction;Therapeutic activities;Gait training;Therapeutic exercise;Patient/family education;Balance training;Stair training;Functional mobility training    PT Goals (Current goals can be found in the Care Plan section)  Acute Rehab PT Goals Patient Stated Goal: to get to feeling better PT Goal Formulation: With patient/family Time For Goal Achievement: 07/05/17 Potential to Achieve Goals: Good    Frequency Min 5X/week   Barriers to discharge        Co-evaluation               AM-PAC PT "6 Clicks" Daily Activity  Outcome Measure Difficulty turning over in bed (including adjusting bedclothes, sheets and blankets)?: Unable Difficulty moving from lying on back to sitting on the side of the bed? : Unable Difficulty sitting down on and standing up from a chair with arms (e.g., wheelchair, bedside commode, etc,.)?: Unable Help needed moving to and from a bed to chair (including a wheelchair)?: A Little Help needed walking in hospital room?: A Little Help needed climbing 3-5 steps with a railing? : A Lot 6 Click Score: 11    End of Session Equipment Utilized During Treatment: Gait belt;Back brace Activity Tolerance: Patient limited by pain Patient left: in bed;with family/visitor present;with call bell/phone within reach   PT Visit Diagnosis: Other abnormalities of gait and mobility (R26.89);Difficulty in walking, not elsewhere classified (R26.2)    Time: 1340-1406 PT Time Calculation (min) (ACUTE ONLY): 26 min   Charges:   PT Evaluation $PT Eval Moderate Complexity: 1 Mod PT Treatments $Gait Training: 8-22 mins   PT G CodesMagda Kiel, [place] [phone] 06/28/2017   [name] 06/28/2017, 2:40 PM

## 2017-06-28 NOTE — Progress Notes (Signed)
Subjective: Patient reports mild back pain. Denies any pain in his legs. States that when he got up to go to the bathroom this morning he felt light headed and his bp dropped. Has not been up and walking since then.   Objective: Vital signs in last 24 hours: Temp:  [96.9 F (36.1 C)-98.5 F (36.9 C)] 98.5 F (36.9 C) (05/16 0334) Pulse Rate:  [43-66] 59 (05/16 0334) Resp:  [4-16] 16 (05/16 0334) BP: (101-158)/(59-89) 101/59 (05/16 0334) SpO2:  [97 %-100 %] 97 % (05/16 0334)  Intake/Output from previous day: 05/15 0701 - 05/16 0700 In: 2980.5 [I.V.:2610.5; IV Piggyback:200] Out: 5427 [CWCBJ:6283; Drains:185; Blood:300] Intake/Output this shift: No intake/output data recorded.  Neurologic: Grossly normal  Lab Results: Lab Results  Component Value Date   WBC 7.8 06/20/2017   HGB 13.2 06/20/2017   HCT 39.5 06/20/2017   MCV 95.0 06/20/2017   PLT 149 (L) 06/20/2017   Lab Results  Component Value Date   INR 1.09 06/27/2017   BMET Lab Results  Component Value Date   NA 139 06/20/2017   K 4.2 06/20/2017   CL 102 06/20/2017   CO2 29 06/20/2017   GLUCOSE 147 (H) 06/20/2017   BUN 17 06/20/2017   CREATININE 1.21 06/20/2017   CALCIUM 9.4 06/20/2017    Studies/Results: Dg Lumbar Spine Complete  Result Date: 06/27/2017 CLINICAL DATA:  L2 through L5  PLIF EXAM: DG C-ARM 61-120 MIN; LUMBAR SPINE - COMPLETE 4+ VIEW COMPARISON:  CT 03/30/2017 FINDINGS: Four low resolution intraoperative spot views of the lumbar spine. Total fluoroscopy time was 6 minutes 3 seconds. Images demonstrate placement of fixating screws and interbody devices within the lumbar spine. IMPRESSION: Intraoperative fluoroscopic assistance provided during lumbar spine surgery. Electronically Signed   By: Donavan Foil M.D.   On: 06/27/2017 20:24   Dg C-arm 1-60 Min  Result Date: 06/27/2017 CLINICAL DATA:  L2 through L5  PLIF EXAM: DG C-ARM 61-120 MIN; LUMBAR SPINE - COMPLETE 4+ VIEW COMPARISON:  CT 03/30/2017  FINDINGS: Four low resolution intraoperative spot views of the lumbar spine. Total fluoroscopy time was 6 minutes 3 seconds. Images demonstrate placement of fixating screws and interbody devices within the lumbar spine. IMPRESSION: Intraoperative fluoroscopic assistance provided during lumbar spine surgery. Electronically Signed   By: Donavan Foil M.D.   On: 06/27/2017 20:24   Dg C-arm 1-60 Min  Result Date: 06/27/2017 CLINICAL DATA:  L2 through L5  PLIF EXAM: DG C-ARM 61-120 MIN; LUMBAR SPINE - COMPLETE 4+ VIEW COMPARISON:  CT 03/30/2017 FINDINGS: Four low resolution intraoperative spot views of the lumbar spine. Total fluoroscopy time was 6 minutes 3 seconds. Images demonstrate placement of fixating screws and interbody devices within the lumbar spine. IMPRESSION: Intraoperative fluoroscopic assistance provided during lumbar spine surgery. Electronically Signed   By: Donavan Foil M.D.   On: 06/27/2017 20:24   Dg C-arm 1-60 Min  Result Date: 06/27/2017 CLINICAL DATA:  L2 through L5  PLIF EXAM: DG C-ARM 61-120 MIN; LUMBAR SPINE - COMPLETE 4+ VIEW COMPARISON:  CT 03/30/2017 FINDINGS: Four low resolution intraoperative spot views of the lumbar spine. Total fluoroscopy time was 6 minutes 3 seconds. Images demonstrate placement of fixating screws and interbody devices within the lumbar spine. IMPRESSION: Intraoperative fluoroscopic assistance provided during lumbar spine surgery. Electronically Signed   By: Donavan Foil M.D.   On: 06/27/2017 20:24    Assessment/Plan: Postop day 1 lumbar fusion. Doing well. Will get patient up slowly today with therapies. Brace up when ambulating  LOS: 1 day    Sean Reyes 06/28/2017, 7:23 AM

## 2017-06-28 NOTE — Progress Notes (Signed)
Called MD referring to pt being diaphoretic and pale with nightshift RN last night after getting up to BR and also notified MD that while working with OT pt had increased dizziness with position change. New orders for 1056ml bolus and to draw CBC and BMP, after bolus repeat orthostatic vital signs- if BP still drops to call back. Will follow through with orders.

## 2017-06-28 NOTE — Evaluation (Signed)
Occupational Therapy Evaluation Patient Details Name: Sean Reyes MRN: [id number] DOB: Jul 05, 1940 Today's Date: 06/28/2017    History of Present Illness  Sean Reyes is s/p L2-3 L3-4, L4-5 fusion. PMHx: Bil hip sxs, lumbar laminectomy/disectomy   Clinical Impression   [name] admitted and underwent above presents to acute OT with increased dizziness with position change, decreased balance, decreased mobility, increased pain, and back precautions all affecting his safety and independence with basic ADLs, with pt being independent prior to admission. He will benefit from acute OT without need for follow up.    Follow Up Recommendations  No OT follow up;Supervision/Assistance - 24 hour    Equipment Recommendations  None recommended by OT       Precautions / Restrictions Precautions Precautions: Back Precaution Booklet Issued: No Precaution Comments: Did verbally explain back precautions to pt; monitor BPs (sitting in recliner 102/65; standing 87/56, sitting post 1 minute 94/61 Required Braces or Orthoses: Spinal Brace Spinal Brace: Applied in sitting position Restrictions Weight Bearing Restrictions: No      Mobility Bed Mobility               General bed mobility comments: Pt up in recliner upon my arrival  Transfers Overall transfer level: Needs assistance Equipment used: Rolling walker (2 wheeled) Transfers: Sit to/from Stand Sit to Stand: Min assist         General transfer comment: Vcs for hand placement and to scoot forward to front of recliner before standing    Balance Overall balance assessment: Needs assistance Sitting-balance support: No upper extremity supported;Feet supported       Standing balance support: Bilateral upper extremity supported Standing balance-Leahy Scale: Poor Standing balance comment: reliant on RW                           ADL either performed or assessed with clinical judgement   ADL Overall ADL's : Needs  assistance/impaired Eating/Feeding: Independent;Sitting   Grooming: Set up;Sitting   Upper Body Bathing: Set up;Supervision/ safety;Sitting   Lower Body Bathing: Maximal assistance Lower Body Bathing Details (indicate cue type and reason): min A sit<>stand Upper Body Dressing : Minimal assistance;Sitting   Lower Body Dressing: Maximal assistance Lower Body Dressing Details (indicate cue type and reason): min A sit<>stand Toilet Transfer: Minimal assistance;Stand-pivot;RW   Toileting- Clothing Manipulation and Hygiene: Minimal assistance Toileting - Clothing Manipulation Details (indicate cue type and reason): min A sit<>stand       General ADL Comments: Educated pt on not being reclined in recliner due to strain on back to get into and out of recliined position     Vision Patient Visual Report: No change from baseline              Pertinent Vitals/Pain Pain Assessment: 0-10 Pain Score: 3  Pain Location: right side Pain Descriptors / Indicators: Aching;Sore Pain Intervention(s): Limited activity within patient's tolerance;Monitored during session     Hand Dominance Right   Extremity/Trunk Assessment Upper Extremity Assessment Upper Extremity Assessment: Overall WFL for tasks assessed   Lower Extremity Assessment Lower Extremity Assessment: Defer to PT evaluation       Communication Communication Communication: No difficulties   Cognition Arousal/Alertness: Awake/alert Behavior During Therapy: WFL for tasks assessed/performed Overall Cognitive Status: Within Functional Limits for tasks assessed  Home Living Family/patient expects to be discharged to:: Private residence Living Arrangements: Spouse/significant other;Children Available Help at Discharge: Family;Available 24 hours/day Type of Home: House Home Access: Stairs to enter [employer] of Steps: 2 Entrance Stairs-Rails:  Right;Left;Can reach both Home Layout: One level     Bathroom Shower/Tub: Walk-in Hydrologist: Handicapped height     Home Equipment: Shower seat - built in;Walker - 2 wheels;Hand held shower head;Grab bars - tub/shower          Prior Functioning/Environment Level of Independence: Independent                 OT Problem List: Decreased strength;Decreased range of motion;Impaired balance (sitting and/or standing);Decreased activity tolerance;Pain;Decreased knowledge of precautions;Decreased knowledge of use of DME or AE      OT Treatment/Interventions: Self-care/ADL training;Balance training;DME and/or AE instruction;Patient/family education    OT Goals(Current goals can be found in the care plan section) Acute Rehab OT Goals Patient Stated Goal: to get to feeling better OT Goal Formulation: With patient Time For Goal Achievement: 07/05/17 Potential to Achieve Goals: Good  OT Frequency: Min 3X/week              AM-PAC PT "6 Clicks" Daily Activity     Outcome Measure Help from another person eating meals?: None Help from another person taking care of personal grooming?: A Little Help from another person toileting, which includes using toliet, bedpan, or urinal?: A Lot Help from another person bathing (including washing, rinsing, drying)?: A Lot Help from another person to put on and taking off regular upper body clothing?: A Little Help from another person to put on and taking off regular lower body clothing?: A Lot 6 Click Score: 16   End of Session Equipment Utilized During Treatment: Rolling walker;Back brace Nurse Communication: Mobility status;Other (comment)(pt's drop in BP from sitting to standing, for now to use urinal and BSC due to drop in BP, and pt should not be reclined in recliner. NT informed of these as well)  Activity Tolerance: Other (comment)(limited by drop in BP and pt symptomatic with dizziness) Patient left: in chair;with  call bell/phone within reach(with NT in room)  OT Visit Diagnosis: Unsteadiness on feet (R26.81);Pain;Dizziness and giddiness (R42) Pain - part of body: (right side)                Time: 4536-4680 OT Time Calculation (min): 18 min Charges:  OT General Charges $OT Visit: 1 Visit OT Evaluation $OT Eval Moderate Complexity: [address], [place] [phone] 06/28/2017

## 2017-06-28 NOTE — Progress Notes (Signed)
Pt went to bathroom. Called out and RN entered room and pt was diaphoretic and pale. Pt also vomited a scant amount while sitting up.  Nurse got floor staff to help get pt back in bed. Took VS and patient was stable per flowsheet. Pt stated to get color back into face and stated he was feeling a lot better. Pt stated that he also noticed a bulge on right lower abdomen. RN assessed and abdomen was not tender and bowel sounds were noted in all four quadrants. Will pass on to day shift nurse and will cont to mont.

## 2017-06-29 DIAGNOSIS — M48061 Spinal stenosis, lumbar region without neurogenic claudication: Principal | ICD-10-CM

## 2017-06-29 DIAGNOSIS — G903 Multi-system degeneration of the autonomic nervous system: Secondary | ICD-10-CM | POA: Diagnosis not present

## 2017-06-29 LAB — BASIC METABOLIC PANEL
Anion gap: 5 (ref 5–15)
BUN: 14 mg/dL (ref 6–20)
CO2: 30 mmol/L (ref 22–32)
Calcium: 8 mg/dL — ABNORMAL LOW (ref 8.9–10.3)
Chloride: 104 mmol/L (ref 101–111)
Creatinine, Ser: 1.23 mg/dL (ref 0.61–1.24)
GFR calc Af Amer: >60 mL/min (ref >60)
GFR calc non Af Amer: 56 mL/min — ABNORMAL LOW (ref >60)
Glucose, Bld: 172 mg/dL — ABNORMAL HIGH (ref 65–99)
Potassium: 4 mmol/L (ref 3.5–5.1)
Sodium: 139 mmol/L (ref 135–145)

## 2017-06-29 LAB — GLUCOSE, CAPILLARY
Glucose-Capillary: 161 mg/dL — ABNORMAL HIGH (ref 65–99)
Glucose-Capillary: 166 mg/dL — ABNORMAL HIGH (ref 65–99)
Glucose-Capillary: 178 mg/dL — ABNORMAL HIGH (ref 65–99)

## 2017-06-29 MED ORDER — SODIUM CHLORIDE 0.9 % IV BOLUS
500.0000 mL | Freq: Once | INTRAVENOUS | Status: AC
  Administered 2017-06-29: 500 mL via INTRAVENOUS

## 2017-06-29 MED ORDER — MIDODRINE HCL 5 MG PO TABS
2.5000 mg | ORAL_TABLET | Freq: Three times a day (TID) | ORAL | Status: DC
  Administered 2017-06-29 – 2017-07-01 (×6): 2.5 mg via ORAL
  Administered 2017-07-01: 5 mg via ORAL
  Administered 2017-07-02: 2.5 mg via ORAL
  Filled 2017-06-29 (×8): qty 1

## 2017-06-29 MED ORDER — PROPAFENONE HCL ER 225 MG PO CP12
450.0000 mg | ORAL_CAPSULE | Freq: Two times a day (BID) | ORAL | Status: DC
  Administered 2017-06-29 – 2017-07-02 (×6): 450 mg via ORAL
  Filled 2017-06-29 (×6): qty 2

## 2017-06-29 NOTE — Progress Notes (Addendum)
Subjective: Patient reports mild back pain but denies leg pain. States that he got very light headed and his BP dropped again this morning when he got up. Still feels light headed but bp back to baseline right now.   Objective: Vital signs in last 24 hours: Temp:  [97.7 F (36.5 C)-98.9 F (37.2 C)] 98.5 F (36.9 C) (05/17 0341) Pulse Rate:  [62-77] 73 (05/17 0341) Resp:  [16-18] 16 (05/17 0341) BP: (112-135)/(60-90) 123/67 (05/17 0341) SpO2:  [92 %-100 %] 94 % (05/17 0341)  Intake/Output from previous day: 05/16 0701 - 05/17 0700 In: 920 [P.O.:720; IV Piggyback:200] Out: 3875 [Urine:1325; Drains:270] Intake/Output this shift: No intake/output data recorded.  Neurologic: Grossly normal  Lab Results: Lab Results  Component Value Date   WBC 11.8 (H) 06/28/2017   HGB 10.0 (L) 06/28/2017   HCT 30.0 (L) 06/28/2017   MCV 95.2 06/28/2017   PLT 129 (L) 06/28/2017   Lab Results  Component Value Date   INR 1.09 06/27/2017   BMET Lab Results  Component Value Date   NA 138 06/28/2017   K 4.1 06/28/2017   CL 101 06/28/2017   CO2 26 06/28/2017   GLUCOSE 220 (H) 06/28/2017   BUN 22 (H) 06/28/2017   CREATININE 1.55 (H) 06/28/2017   CALCIUM 7.7 (L) 06/28/2017    Studies/Results: Dg Lumbar Spine Complete  Result Date: 06/27/2017 CLINICAL DATA:  L2 through L5  PLIF EXAM: DG C-ARM 61-120 MIN; LUMBAR SPINE - COMPLETE 4+ VIEW COMPARISON:  CT 03/30/2017 FINDINGS: Four low resolution intraoperative spot views of the lumbar spine. Total fluoroscopy time was 6 minutes 3 seconds. Images demonstrate placement of fixating screws and interbody devices within the lumbar spine. IMPRESSION: Intraoperative fluoroscopic assistance provided during lumbar spine surgery. Electronically Signed   By: Donavan Foil M.D.   On: 06/27/2017 20:24   Dg C-arm 1-60 Min  Result Date: 06/27/2017 CLINICAL DATA:  L2 through L5  PLIF EXAM: DG C-ARM 61-120 MIN; LUMBAR SPINE - COMPLETE 4+ VIEW COMPARISON:  CT  03/30/2017 FINDINGS: Four low resolution intraoperative spot views of the lumbar spine. Total fluoroscopy time was 6 minutes 3 seconds. Images demonstrate placement of fixating screws and interbody devices within the lumbar spine. IMPRESSION: Intraoperative fluoroscopic assistance provided during lumbar spine surgery. Electronically Signed   By: Donavan Foil M.D.   On: 06/27/2017 20:24   Dg C-arm 1-60 Min  Result Date: 06/27/2017 CLINICAL DATA:  L2 through L5  PLIF EXAM: DG C-ARM 61-120 MIN; LUMBAR SPINE - COMPLETE 4+ VIEW COMPARISON:  CT 03/30/2017 FINDINGS: Four low resolution intraoperative spot views of the lumbar spine. Total fluoroscopy time was 6 minutes 3 seconds. Images demonstrate placement of fixating screws and interbody devices within the lumbar spine. IMPRESSION: Intraoperative fluoroscopic assistance provided during lumbar spine surgery. Electronically Signed   By: Donavan Foil M.D.   On: 06/27/2017 20:24   Dg C-arm 1-60 Min  Result Date: 06/27/2017 CLINICAL DATA:  L2 through L5  PLIF EXAM: DG C-ARM 61-120 MIN; LUMBAR SPINE - COMPLETE 4+ VIEW COMPARISON:  CT 03/30/2017 FINDINGS: Four low resolution intraoperative spot views of the lumbar spine. Total fluoroscopy time was 6 minutes 3 seconds. Images demonstrate placement of fixating screws and interbody devices within the lumbar spine. IMPRESSION: Intraoperative fluoroscopic assistance provided during lumbar spine surgery. Electronically Signed   By: Donavan Foil M.D.   On: 06/27/2017 20:24    Assessment/Plan: Still orthostatic. Will consult medicine to help manage   LOS: 2 days    Joelene Millin  Lonia Blood 06/29/2017, 7:57 AM   Agree with above, d/c'd anti-hypertensives

## 2017-06-29 NOTE — Progress Notes (Signed)
PT Cancellation Note  Patient Details Name: Sean Reyes MRN: [id number] DOB: [birth year]   Cancelled Treatment:    Reason Eval/Treat Not Completed: Medical issues which prohibited therapy, reports near syncope again this morning with orthostatic BP.  Currently getting a bolus.  Will attempt again later today.   [name] 06/29/2017, 11:45 AM [name], PT [phone] 06/29/2017

## 2017-06-29 NOTE — Care Management Note (Signed)
Case Management Note  Patient Details  Name: Sean Reyes MRN: [id number] Date of Birth: 1940/08/09  Subjective/Objective:   Pt admitted on 06/27/17 s/p L2-3 L3-4, L4-5 fusion.  PTA, pt independent, lives with spouse.                  Action/Plan: PT recommending HH follow up.  Will follow for [facility] orders as pt progresses.  Wife able to provide assistance at dc.   Expected Discharge Date:                  Expected Discharge Plan:  [place]  In-House Referral:     Discharge planning Services  CM Consult  Post Acute Care Choice:    Choice offered to:     DME Arranged:    DME Agency:     HH Arranged:    [place] Agency:     Status of Service:  In process, will continue to follow  If discussed at Long Length of Stay Meetings, dates discussed:    Additional Comments:  Ella Bodo, RN 06/29/2017, 4:09 PM

## 2017-06-29 NOTE — Consult Note (Addendum)
Medical Consultation   Sean Reyes  NUU:[id number]  DOB: 02-04-1941  DOA: 06/27/2017  PCP: [name], MD   Outpatient Specialists: Cardiologist is Dr. Nehemiah Massed at [facility] in [place]; urologist Dr. Arther Dames; vascular surgeon Dr. Donnetta Hutching;   Requesting physician: Dr. [name], neurosurgery  Reason for consultation: Prolonged postoperative orthostatic hypotension   History of Present Illness: Sean Reyes is an 77 y.o. male with a past medical history significant for electrophysiologic ablation, anterior lumbar fusion, hip replacement, and multiple other surgeries and medical diagnoses significant for Beatties mellitus type II, sleep apnea, depression, arthritis, and atrial fibrillation who underwent an anterior lateral lumbar interbody fusion-lumbar 2-lumbar 3,-lumbar 3-lumbar 4, and a posterior lumbar interbody fusion of lumbar 4 5 on 06/27/2017.  She reports multiple episodes of difficulty with orthostatic hypotension after surgery.  In fact he states that after his electrophysiologic ablation years ago it took him several days to be able to sit up.  He reports that prior to surgery he has had his average level of activity and in fact on Saturday despite using a cane was out on the driveway blowing the leaves.  Since surgery the patient has had significant difficulty with getting up out of a reclining position.  He becomes very dizzy and passes out.  Says this is similar to when he had his lumbar laminectomy in August 2018.  Vision is on multiple vasoactive medications including amlodipine, benazepril, finasteride, metoprolol, and pro-path and known.  His amlodipine and benazepril have been held as has his metoprolol.  Despite this his blood pressures continue to drop when he stands up.  Both mornings he has had early morning orthostatic blood pressure checks and his blood pressures have gone from the 130s over 70s sitting to 83/54 standing at 3 minutes.  He feels flushed  and then he feels like he is going to pass out.  We have been consulted for further evaluation and management.  Please note patient reports that he has been a diabetic for years but is diabetes is fairly well-controlled with a hemoglobin A1c of 6.5 on May 02, 2017.   The patient was started on testosterone in the form of Clomid by his urologist for "general well-being" because he felt kind of bad.  Testosterone levels were fairly low he states that he feels a little bit better but is not sure it is all due to the testosterone replacement.    Review of Systems:  As per HPI otherwise 10 point review of systems negative.    Past Medical History: Past Medical History:  Diagnosis Date  . Anemia   . Anxiety   . Arthritis   . Atrial fibrillation ([place])   . Complication of anesthesia    pt reports low BP's after surgery at [facility] and difficulty awakening  . Depression   . Diabetes ([place])   . Dysrhythmia   . GERD (gastroesophageal reflux disease)    OCC TAKES ALKA SELTZER  . Hyperlipidemia   . Hypertension   . Nocturia   . S/P ablation of atrial fibrillation    Ablative therapy  . Sleep apnea    CPAP   . Tachycardia, unspecified     Past Surgical History: Past Surgical History:  Procedure Laterality Date  . ABLATION    . ANTERIOR LAT LUMBAR FUSION N/A 06/27/2017   Procedure: Anterior Lateral Lumbar Interbody  Fusion - Lumbar Two-Lumbar Three - Lumbar Three-Lumbar Four, Posterior  Lumbar Interbody Fusion Lumbar Four- Five;  Surgeon: Kary Kos, MD;  Location: [place];  Service: Neurosurgery;  Laterality: N/A;  Anterior Lateral Lumbar Interbody  Fusion - Lumbar Two-Lumbar Three - Lumbar Three-Lumbar Four, Posterior Lumbar Interbody Fusion Lumbar Four- Five  . COLONOSCOPY WITH PROPOFOL N/A 10/05/2015   Procedure: COLONOSCOPY WITH PROPOFOL;  Surgeon: Lollie Sails, MD;  Location: [facility] ENDOSCOPY;  Service: Endoscopy;  Laterality: N/A;  . HERNIA REPAIR    . JOINT REPLACEMENT  Bilateral    hips  RT+  LEFT X2   . LUMBAR LAMINECTOMY/DECOMPRESSION MICRODISCECTOMY Left 09/13/2016   Procedure: Microdiscectomy - Lumbar two-three,  Lumbar three- - left;  Surgeon: Kary Kos, MD;  Location: [place];  Service: Neurosurgery;  Laterality: Left;  . TONSILLECTOMY       Allergies:   Allergies  Allergen Reactions  . Levaquin [Levofloxacin In D5w] Anaphylaxis and Shortness Of Breath  . Shellfish Allergy Anaphylaxis  . Amiodarone Other (See Comments)    Tremors and thyroid toxicity     Social History:  reports that he has never smoked. He has never used smokeless tobacco. He reports that he does not drink alcohol or use drugs.   Family History: Family History  Problem Relation Age of Onset  . Brain cancer Mother   . Kidney disease Neg Hx   . Prostate cancer Neg Hx   . Kidney cancer Neg Hx   . Bladder Cancer Neg Hx       Physical Exam: Vitals:   06/28/17 2101 06/28/17 2351 06/29/17 0341 06/29/17 0829  BP: 135/66 121/66 123/67 118/70  Pulse: 76 75 73 65  Resp:   16   Temp:  98 F (36.7 C) 98.5 F (36.9 C) 98.3 F (36.8 C)  TempSrc:  Oral Oral Oral  SpO2:  92% 94% 97%  Weight:      Height:        Constitutional: Developed well-nourished, alert and awake, oriented x3, not in any acute distress. Eyes: PERLA, EOMI, irises appear normal, anicteric sclera,  ENMT: external ears and nose appear normal, hearing is normal            Lips appears normal, oropharynx mucosa, tongue, posterior pharynx appear normal  Neck: neck appears normal, no masses, normal ROM, no thyromegaly, no JVD  CVS: S1-S2 clear, no murmur rubs or gallops, no LE edema, normal pedal pulses  Respiratory:  clear to auscultation bilaterally, no wheezing, rales or rhonchi. Respiratory effort normal. No accessory muscle use.  Abdomen: soft nontender, nondistended, normal bowel sounds, no hepatosplenomegaly, no hernias  Musculoskeletal: : no cyanosis, clubbing or edema noted bilaterally                        good tone no arthropathy Neuro: Cranial nerves II-XII intact, strength, sensation, reflexes Psych: judgement and insight appear normal, stable mood and affect, mental status Skin: no rashes or lesions or ulcers, no induration or nodules    Data reviewed:  I have personally reviewed following labs and imaging studies Labs:  CBC: Recent Labs  Lab 06/28/17 1149  WBC 11.8*  HGB 10.0*  HCT 30.0*  MCV 95.2  PLT 129*    Basic Metabolic Panel: Recent Labs  Lab 06/28/17 1149 06/29/17 0817  NA 138 139  K 4.1 4.0  CL 101 104  CO2 26 30  GLUCOSE 220* 172*  BUN 22* 14  CREATININE 1.55* 1.23  CALCIUM 7.7* 8.0*   GFR Estimated Creatinine Clearance: 59.5 mL/min (  by C-G formula based on SCr of 1.23 mg/dL).  Recent Labs  Lab 06/27/17 0705  INR 1.09    Cardiac Enzymes: No results for input(s): CKTOTAL, CKMB, CKMBINDEX, TROPONINI in the last 168 hours. BNP: Invalid input(s): POCBNP CBG: Recent Labs  Lab 06/28/17 1230 06/28/17 1554 06/28/17 1707 06/28/17 2130 06/29/17 0832  GLUCAP 204* 248* 228* 194* 161*   Urinalysis    Component Value Date/Time   APPEARANCEUR Clear 01/08/2017 1328   GLUCOSEU Trace 01/08/2017 1328   BILIRUBINUR Negative 01/08/2017 1328   PROTEINUR 1+ (A) 01/08/2017 1328   NITRITE Negative 01/08/2017 1328   LEUKOCYTESUR Negative 01/08/2017 1328     Microbiology Recent Results (from the past 240 hour(s))  Surgical pcr screen     Status: None   Collection Time: 06/20/17 11:12 AM  Result Value Ref Range Status   MRSA, PCR NEGATIVE NEGATIVE Final   Staphylococcus aureus NEGATIVE NEGATIVE Final    Comment: (NOTE) The Xpert SA Assay (FDA approved for NASAL specimens in patients 77 years of age and older), is one component of a comprehensive surveillance program. It is not intended to diagnose infection nor to guide or monitor treatment. Performed at [facility], [place] [address] [place], [place] [address]        Inpatient  Medications:   Scheduled Meds: . clomiPHENE  25 mg Oral QHS  . finasteride  5 mg Oral QHS  . insulin aspart  0-5 Units Subcutaneous QHS  . insulin aspart  0-9 Units Subcutaneous TID WC  . metFORMIN  1,000 mg Oral BID  . midodrine  2.5 mg Oral TID WC  . pantoprazole  40 mg Oral Daily  . propafenone  450 mg Oral BID  . sodium chloride flush  3 mL Intravenous Q12H   Continuous Infusions: . sodium chloride 250 mL (06/27/17 1811)     Radiological Exams on Admission: Dg Lumbar Spine Complete  Result Date: 06/27/2017 CLINICAL DATA:  L2 through L5  PLIF EXAM: DG C-ARM 61-120 MIN; LUMBAR SPINE - COMPLETE 4+ VIEW COMPARISON:  CT 03/30/2017 FINDINGS: Four low resolution intraoperative spot views of the lumbar spine. Total fluoroscopy time was 6 minutes 3 seconds. Images demonstrate placement of fixating screws and interbody devices within the lumbar spine. IMPRESSION: Intraoperative fluoroscopic assistance provided during lumbar spine surgery. Electronically Signed   By: Donavan Foil M.D.   On: 06/27/2017 20:24   Dg C-arm 1-60 Min  Result Date: 06/27/2017 CLINICAL DATA:  L2 through L5  PLIF EXAM: DG C-ARM 61-120 MIN; LUMBAR SPINE - COMPLETE 4+ VIEW COMPARISON:  CT 03/30/2017 FINDINGS: Four low resolution intraoperative spot views of the lumbar spine. Total fluoroscopy time was 6 minutes 3 seconds. Images demonstrate placement of fixating screws and interbody devices within the lumbar spine. IMPRESSION: Intraoperative fluoroscopic assistance provided during lumbar spine surgery. Electronically Signed   By: Donavan Foil M.D.   On: 06/27/2017 20:24   Dg C-arm 1-60 Min  Result Date: 06/27/2017 CLINICAL DATA:  L2 through L5  PLIF EXAM: DG C-ARM 61-120 MIN; LUMBAR SPINE - COMPLETE 4+ VIEW COMPARISON:  CT 03/30/2017 FINDINGS: Four low resolution intraoperative spot views of the lumbar spine. Total fluoroscopy time was 6 minutes 3 seconds. Images demonstrate placement of fixating screws and interbody  devices within the lumbar spine. IMPRESSION: Intraoperative fluoroscopic assistance provided during lumbar spine surgery. Electronically Signed   By: Donavan Foil M.D.   On: 06/27/2017 20:24   Dg C-arm 1-60 Min  Result Date: 06/27/2017 CLINICAL DATA:  L2 through L5  PLIF EXAM: DG C-ARM 61-120 MIN; LUMBAR SPINE - COMPLETE 4+ VIEW COMPARISON:  CT 03/30/2017 FINDINGS: Four low resolution intraoperative spot views of the lumbar spine. Total fluoroscopy time was 6 minutes 3 seconds. Images demonstrate placement of fixating screws and interbody devices within the lumbar spine. IMPRESSION: Intraoperative fluoroscopic assistance provided during lumbar spine surgery. Electronically Signed   By: Donavan Foil M.D.   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  Spinal stenosis of lumbar region status post surgery: This patient has a chronic history of difficulty with surgeries and anesthesia.  He frequently is dizzy and has hypotension postop however this episode has lasted longer than most others.  I believe that this is due to his long-standing diabetes that he may be developing autonomic neuropathy which is not symptomatic when he is not receiving medications or having surgery.  This surgery is especially complicating because it was in fact surgery in the region of the lumbar spine and anesthesia may have been more likely to affect his autonomic neuropathy.  I believe that the patient will improve over time however at this point he can even stand up  he gets dizzy and his blood pressure drops.  Time blood pressures are acceptable currently off of medications as mentioned above.  I am going to give him a trial of Midrin at a very low dose 2.5 mg 3 times daily and will monitor his response to treatment.  I believe that he will only need this medication for 2 to 3 weeks postoperatively.  2.  Diabetes mellitus with autonomic neuropathy: Although the patient's diabetes is well controlled it is well described that occasions of diabetes occur regardless of hemoglobin A1c levels.  I suspect that if patient were to have any kind of surgery in the future planning should be made regarding expected postop orthostatic hypotension.  3.  Neurologic orthostatic hypotension: Likely related to long-standing diabetes mellitus as noted above.  Patient's blood pressure drops to the 80s he feels dizzy when he tries to stand up.  We will start Midrin and see if he responds well to it.  Midrin is a medication that has multiple side effects including difficulty with urination and the patient is already on finasteride due to this.  He does have benign prostatic hypertrophy and is at risk of developing urinary retention.  Would monitor urine outputs closely.  4.  Atrial fibrillation: Patient chronically anticoagulated due to atrial fibrillation he is also on propafenone as well as a beta-blocker.  The beta-blocker has been held but the propafenone has been continued appropriately.  Continue to monitor closely would recommend patient continue on current monitoring schedule per nursing.  5.  Low testosterone levels: Patient is currently on Clomid for testosterone replacement.  I did discuss with him my serious concerns about this medication.  The 2 indications for this medications are osteoporosis and poor libido.  Patient states he was put on it for a feeling of weakness and tiredness and general well-being.  Although his testosterone levels have recurrent return  to normal he is  not sure if his symptoms have significantly improved.  I asked him to discuss this with his primary care physician as well as his urologist.  I have personally discussed this case with Dr. [name] and very much appreciates his consultation we will continue to follow Mr. Pietrzyk with you.  Thank you for this consultation.  Our [facility] hospitalist team will follow the patient with you.   Time Spent: 70  Lady Deutscher M.D. [facility] 06/29/2017, 12:22 PM

## 2017-06-29 NOTE — Progress Notes (Signed)
Spoke with PA referring to pts orthostatic hypotension this morning. Refer to chart for BP and HR. 500 ml blous received per order. Will continue to monitor.   Update: Internal medicine consulted with new medication orders. Will follow through and continue to monitor BP.

## 2017-06-29 NOTE — Progress Notes (Signed)
Physical Therapy Treatment Patient Details Name: Sean Reyes MRN: [id number] DOB: 1940-05-09 Today's Date: 06/29/2017    History of Present Illness  Sean Reyes is s/p L2-3 L3-4, L4-5 fusion. PMHx: Bil hip sxs, lumbar laminectomy/disectomy    PT Comments    Patient with slow progressing due to orthostatic symptoms.  Both yesterday and today had to get fluid bolus to be able to participate.  Feel if his symptoms are not completely resolved he may need increased help at home and could qualify for Home [facility] through [facility].  PT to follow acutely.   Follow Up Recommendations  Home health PT(HH aide)     Equipment Recommendations  None recommended by PT    Recommendations for Other Services       Precautions / Restrictions Precautions Precautions: Back Precaution Comments: recalled 2/3 precautions Required Braces or Orthoses: Spinal Brace Spinal Brace: Applied in sitting position    Mobility  Bed Mobility Overal bed mobility: Needs Assistance   Rolling: Min assist Sidelying to sit: Mod assist       General bed mobility comments: inicreased time to roll with cues and assist, assist for legs and trunk to sit   Transfers Overall transfer level: Needs assistance Equipment used: Rolling walker (2 wheeled) Transfers: Sit to/from Stand Sit to Stand: From elevated surface;Min assist         General transfer comment: cues for hand placement, assist for balance  Ambulation/Gait Ambulation/Gait assistance: Min assist Ambulation Distance (Feet): 100 Feet Assistive device: Rolling walker (2 wheeled) Gait Pattern/deviations: Step-through pattern;Decreased stride length;Antalgic;Trunk flexed     General Gait Details: cues for forward gaze, trunk extension.  planned to attempt second walk, but pt nauseated, so walked back around bed to sit in chair BP after ambulation 113/69.   Stairs             Wheelchair Mobility    Modified Rankin (Stroke Patients  Only)       Balance Overall balance assessment: Needs assistance Sitting-balance support: Feet supported Sitting balance-Leahy Scale: Fair     Standing balance support: Bilateral upper extremity supported Standing balance-Leahy Scale: Poor Standing balance comment: reliant on RW                            Cognition Arousal/Alertness: Awake/alert Behavior During Therapy: WFL for tasks assessed/performed Overall Cognitive Status: Within Functional Limits for tasks assessed                                        Exercises      General Comments General comments (skin integrity, edema, etc.): wife and daughter in room      Pertinent Vitals/Pain Pain Score: 4  Pain Location: R hip with ambulation at times Pain Descriptors / Indicators: Sharp;Sore Pain Intervention(s): Monitored during session;Repositioned    Home Living                      Prior Function            PT Goals (current goals can now be found in the care plan section) Progress towards PT goals: Progressing toward goals    Frequency    Min 5X/week      PT Plan Current plan remains appropriate    Co-evaluation  AM-PAC PT "6 Clicks" Daily Activity  Outcome Measure  Difficulty turning over in bed (including adjusting bedclothes, sheets and blankets)?: Unable Difficulty moving from lying on back to sitting on the side of the bed? : Unable Difficulty sitting down on and standing up from a chair with arms (e.g., wheelchair, bedside commode, etc,.)?: Unable Help needed moving to and from a bed to chair (including a wheelchair)?: A Little Help needed walking in hospital room?: A Little Help needed climbing 3-5 steps with a railing? : A Lot 6 Click Score: 11    End of Session Equipment Utilized During Treatment: Gait belt;Back brace Activity Tolerance: Other (comment)(limited due to nausea) Patient left: in chair;with call bell/phone within  reach;with family/visitor present   PT Visit Diagnosis: Other abnormalities of gait and mobility (R26.89);Difficulty in walking, not elsewhere classified (R26.2)     Time: 2233-6122 PT Time Calculation (min) (ACUTE ONLY): 25 min  Charges:  $Gait Training: 8-22 mins $Therapeutic Activity: 8-22 mins                    G CodesMagda Kiel, [place] [phone] 06/29/2017    [name] 06/29/2017, 4:52 PM

## 2017-06-30 DIAGNOSIS — G4733 Obstructive sleep apnea (adult) (pediatric): Secondary | ICD-10-CM

## 2017-06-30 DIAGNOSIS — N138 Other obstructive and reflux uropathy: Secondary | ICD-10-CM

## 2017-06-30 DIAGNOSIS — E1143 Type 2 diabetes mellitus with diabetic autonomic (poly)neuropathy: Secondary | ICD-10-CM

## 2017-06-30 DIAGNOSIS — R7989 Other specified abnormal findings of blood chemistry: Secondary | ICD-10-CM

## 2017-06-30 DIAGNOSIS — I480 Paroxysmal atrial fibrillation: Secondary | ICD-10-CM

## 2017-06-30 DIAGNOSIS — Z7901 Long term (current) use of anticoagulants: Secondary | ICD-10-CM

## 2017-06-30 DIAGNOSIS — N401 Enlarged prostate with lower urinary tract symptoms: Secondary | ICD-10-CM

## 2017-06-30 LAB — GLUCOSE, CAPILLARY
Glucose-Capillary: 140 mg/dL — ABNORMAL HIGH (ref 65–99)
Glucose-Capillary: 162 mg/dL — ABNORMAL HIGH (ref 65–99)
Glucose-Capillary: 184 mg/dL — ABNORMAL HIGH (ref 65–99)
Glucose-Capillary: 129 mg/dL — ABNORMAL HIGH (ref 65–99)

## 2017-06-30 LAB — CORTISOL: Cortisol, Plasma: 14.8 ug/dL

## 2017-06-30 MED ORDER — METOPROLOL SUCCINATE ER 25 MG PO TB24
25.0000 mg | ORAL_TABLET | Freq: Every day | ORAL | Status: DC
  Administered 2017-06-30 – 2017-07-02 (×3): 25 mg via ORAL
  Filled 2017-06-30 (×3): qty 1

## 2017-06-30 MED ORDER — SENNOSIDES-DOCUSATE SODIUM 8.6-50 MG PO TABS
1.0000 | ORAL_TABLET | Freq: Two times a day (BID) | ORAL | Status: DC
  Administered 2017-06-30 – 2017-07-01 (×3): 1 via ORAL
  Filled 2017-06-30 (×5): qty 1

## 2017-06-30 NOTE — Progress Notes (Signed)
PT Cancellation Note  Patient Details Name: Sean Reyes MRN: [id number] DOB: Sep 20, 1940   Cancelled Treatment:    Reason Eval/Treat Not Completed: Patient declined, no reason specified;Other (comment)(dizziness/nausea)  Attempted to see pt for mobility progression. Pt supine in bed upon arrival. Pt declined due to dizziness and nausea upon return from bathroom with RN. BP WNL in supine however pt too dizzy to attempt sitting/standing at this time to check for orthostatic BP. Family present. PT will continue to follow acutely and progress as tolerated.    Salina April, PTA Pager: [phone]   06/30/2017, 4:05 PM

## 2017-06-30 NOTE — Progress Notes (Signed)
Subjective: Patient reports mild back pain but has improved significantly. Denies any leg pain. Reports feeling better than yesterday. Was able to walk the unit yesterday without getting light headed. Feels as though he is improving  Objective: Vital signs in last 24 hours: Temp:  [98.2 F (36.8 C)-99.2 F (37.3 C)] 98.2 F (36.8 C) (05/18 0839) Pulse Rate:  [68-87] 87 (05/18 0839) Resp:  [16] 16 (05/18 0300) BP: (122-156)/(71-86) 142/86 (05/18 0839) SpO2:  [93 %-98 %] 98 % (05/18 0839)  Intake/Output from previous day: 05/17 0701 - 05/18 0700 In: 600 [P.O.:600] Out: 2220 [Urine:2050; Drains:170] Intake/Output this shift: No intake/output data recorded.  Neurologic: Grossly normal  Lab Results: Lab Results  Component Value Date   WBC 11.8 (H) 06/28/2017   HGB 10.0 (L) 06/28/2017   HCT 30.0 (L) 06/28/2017   MCV 95.2 06/28/2017   PLT 129 (L) 06/28/2017   Lab Results  Component Value Date   INR 1.09 06/27/2017   BMET Lab Results  Component Value Date   NA 139 06/29/2017   K 4.0 06/29/2017   CL 104 06/29/2017   CO2 30 06/29/2017   GLUCOSE 172 (H) 06/29/2017   BUN 14 06/29/2017   CREATININE 1.23 06/29/2017   CALCIUM 8.0 (L) 06/29/2017    Studies/Results: No results found.  Assessment/Plan: Improving significantly. Was started on Midrin and has help with orthostasis. Plan is to d/c home with [facility] when ready. Will see how he does today and possibly d/c home tomorrow.    LOS: 3 days    Ocie Cornfield [facility] 06/30/2017, 8:55 AM

## 2017-06-30 NOTE — Progress Notes (Signed)
1900: Handoff report received from RN. Pt resting in bed. Discussed plan of care for the shift; pt amenable to plan.  0000: Pt resting comfortably.  0400: Pt continues resting comfortably.  0700: Handoff report given to RN. Pt w 2 small BMs overnight; still reports a need to eliminate.

## 2017-06-30 NOTE — Progress Notes (Signed)
PROGRESS NOTE  Sean Reyes ZOX:[id number] DOB: 08/25/1940 DOA: 06/27/2017 PCP: [name], MD   LOS: 3 days   Brief Narrative / Interim history: 77 year old male with history of A. fib status post EP ablation, multiple prior surgeries complicated by postop hypotension, lightheadedness and dizziness, type 2 diabetes mellitus, OSA, who was admitted on neurosurgery service for anterolateral interbody fusion L2-3 L3-4.  Medicine was consulted for persistent hypotension/orthostasis.  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)   OSA (obstructive sleep apnea)   BPH with obstruction/lower urinary tract symptoms   Low testosterone   Neurologic orthostatic hypotension (HCC)   Orthostatic hypotension, postop -Patient's blood pressure was noted to drop into the 80s on standing, symptomatic -He has a history of significant postoperative orthostatic hypotension each time he underwent the procedure -Likely due to autonomic neuropathy in the setting of long-standing diabetes mellitus -He was started on Midorine, he is asymptomatic today, seems to have helped, will need to continue for couple weeks postop, will need close follow-up with outpatient MD when this medication will be stopped -Cortisol normal thus do not think he has adrenal insufficiency which could have also explained his symptoms  Diabetes mellitus with autonomic neuropathy -Generally well controlled but that does not rule out developmental complications -CBG this morning 140, reasonable inpatient control, continue metformin and sliding scale  Paroxysmal A. fib -Continue propafenone, status post multiple ablations -He is on Eliquis at home, resume  Eliquis postop as per neurosurgery recommendations  HTN -On benazepril, metoprolol, Norvasc at home -I am going to reintroduce metoprolol for A. fib purposes, hold benazepril and Norvasc since patient will need midodrine.  Recommend patient monitors his blood pressure at home and will need to be in close discussions with the PCP regarding blood pressure trends, symptoms and when he can be taken off midodrine and resumed on amlodipine and benazepril  Low testosterone levels -Currently on Clomid, recommend ongoing discussions with outpatient MD regarding ongoing need for this   Okay to discharge tomorrow from medicine standpoint We will see again patient in the morning   DVT prophylaxis: per primary Code Status: Full code Family Communication: no family bedside Disposition Plan: home per neurosurgery    Procedures:  anterolateral interbody fusion L2-3 L3-4  Antimicrobials:  None    Subjective: - no chest pain, shortness of breath, no abdominal pain, nausea or vomiting.  Feels much better today, he was able to ambulate without difficulties, without lightheadedness or dizziness  Objective: Vitals:   06/29/17 1900 06/29/17 2300 06/30/17 0300 06/30/17 0839  BP: (!) 156/84 (!) 147/78 (!) 152/72 (!) 142/86  Pulse: 70 78 80 87  Resp:   16   Temp: 99.2 F (37.3 C) 99 F (37.2 C) 98.9 F (37.2 C) 98.2 F (36.8 C)  TempSrc: Oral Oral Oral Oral  SpO2: 98% 93% 93% 98%  Weight:      Height:        Intake/Output Summary (Last 24 hours) at 06/30/2017 1045 Last data filed at 06/30/2017 0700 Gross per 24 hour  Intake 360 ml  Output 1920 ml  Net -1560 ml   Filed Weights   06/27/17 0651  Weight: 88.3 kg (194 lb 11.2 oz)    Examination:  Constitutional: NAD  Eyes: lids and conjunctivae normal Respiratory: clear to auscultation bilaterally, no wheezing, no crackles. Normal respiratory effort. Cardiovascular: Regular rate and rhythm, no murmurs / rubs / gallops. No LE edema.  2 Abdomen: no tenderness. Bowel sounds positive.  Skin: no rashes Neurologic: non focal    Data Reviewed: I have independently reviewed following labs and imaging studies   CBC: Recent Labs  Lab 06/28/17 1149  WBC 11.8*  HGB 10.0*  HCT 30.0*  MCV 95.2  PLT 353*   Basic Metabolic Panel: Recent Labs  Lab 06/28/17 1149 06/29/17 0817  NA 138 139  K 4.1 4.0  CL 101 104  CO2 26 30  GLUCOSE 220* 172*  BUN 22* 14  CREATININE 1.55* 1.23  CALCIUM 7.7* 8.0*   GFR: Estimated Creatinine Clearance: 59.5 mL/min (by C-G formula based on SCr of 1.23 mg/dL). Liver Function Tests: No results for input(s): AST, ALT, ALKPHOS, BILITOT, PROT, ALBUMIN in the last 168 hours. No results for input(s): LIPASE, AMYLASE in the last 168 hours. No results for input(s): AMMONIA in the last 168 hours. Coagulation Profile: Recent Labs  Lab 06/27/17 0705  INR 1.09   Cardiac Enzymes: No results for input(s): CKTOTAL, CKMB, CKMBINDEX, TROPONINI in the last 168 hours. BNP (last 3 results) No results for input(s): PROBNP in the last 8760 hours. HbA1C: No results for input(s): HGBA1C in the last 72 hours. CBG: Recent Labs  Lab 06/28/17 2130 06/29/17 0832 06/29/17 1224 06/29/17 1641 06/30/17 0853  GLUCAP 194* 161* 166* 178* 140*   Lipid Profile: No results for input(s): CHOL, HDL, LDLCALC, TRIG, CHOLHDL, LDLDIRECT in the last 72 hours. Thyroid Function Tests: No results for input(s): TSH, T4TOTAL, FREET4, T3FREE, THYROIDAB in the last 72 hours. Anemia Panel: No results for input(s): VITAMINB12, FOLATE, FERRITIN, TIBC, IRON, RETICCTPCT in the last 72 hours. Urine analysis:    Component Value Date/Time   APPEARANCEUR Clear 01/08/2017 1328   GLUCOSEU Trace 01/08/2017 1328   BILIRUBINUR Negative 01/08/2017 1328   PROTEINUR 1+ (A) 01/08/2017 1328   NITRITE Negative 01/08/2017 1328   LEUKOCYTESUR Negative 01/08/2017 1328   Sepsis Labs: Invalid input(s): PROCALCITONIN,  LACTICIDVEN  Recent Results (from the past 240 hour(s))  Surgical pcr screen     Status: None   Collection Time: 06/20/17 11:12 AM  Result Value Ref Range Status   MRSA, PCR NEGATIVE NEGATIVE Final   Staphylococcus aureus NEGATIVE NEGATIVE Final    Comment: (NOTE) The Xpert SA Assay (FDA approved for NASAL specimens in patients 27 years of age and older), is one component of a comprehensive surveillance program. It is not intended to diagnose infection nor to guide or monitor treatment. Performed at [facility], [place] [address], [place], [place] [address]       Radiology Studies: No results found.   Scheduled Meds: . clomiPHENE  25 mg Oral QHS  . finasteride  5 mg Oral QHS  . insulin aspart  0-5 Units Subcutaneous QHS  . insulin aspart  0-9 Units Subcutaneous TID WC  . metFORMIN  1,000 mg Oral BID  . midodrine  2.5 mg Oral TID WC  . pantoprazole  40 mg Oral Daily  . propafenone  450 mg Oral BID  . sodium chloride flush  3 mL Intravenous Q12H   Continuous Infusions: . sodium chloride 250 mL (06/27/17 1811)     Marzetta Board, MD, PhD [facility] Pager [phone]  If 7PM-7AM, please contact night-coverage [email or web address] Password [email or web address] 06/30/2017, 10:45 AM

## 2017-06-30 NOTE — Progress Notes (Signed)
OT Cancellation Note  Patient Details Name: Sean Reyes MRN: [id number] DOB: 06-Jul-1940   Cancelled Treatment:    Reason Eval/Treat Not Completed: Medical issues which prohibited therapy.  C/o nausea and dizziness after using BR.  Will reattempt.  Ward, OTR/L [phone]   Lucille Passy M 06/30/2017, 4:10 PM

## 2017-07-01 LAB — GLUCOSE, CAPILLARY
Glucose-Capillary: 154 mg/dL — ABNORMAL HIGH (ref 65–99)
Glucose-Capillary: 157 mg/dL — ABNORMAL HIGH (ref 65–99)
Glucose-Capillary: 135 mg/dL — ABNORMAL HIGH (ref 65–99)
Glucose-Capillary: 177 mg/dL — ABNORMAL HIGH (ref 65–99)

## 2017-07-01 MED ORDER — BISACODYL 5 MG PO TBEC: 5.0000 mg | DELAYED_RELEASE_TABLET | Freq: Once | ORAL | Status: DC

## 2017-07-01 MED ORDER — CYCLOBENZAPRINE HCL 10 MG PO TABS: 10.0000 mg | ORAL_TABLET | Freq: Three times a day (TID) | ORAL | 0 refills | Status: DC | PRN

## 2017-07-01 MED ORDER — DOCUSATE SODIUM 100 MG PO CAPS
100.0000 mg | ORAL_CAPSULE | Freq: Every day | ORAL | Status: DC
  Administered 2017-07-01: 100 mg via ORAL
  Filled 2017-07-01 (×2): qty 1

## 2017-07-01 MED ORDER — HYDROCODONE-ACETAMINOPHEN 5-325 MG PO TABS: 1.0000 | ORAL_TABLET | ORAL | 0 refills | Status: DC | PRN

## 2017-07-01 MED ORDER — BISACODYL 5 MG PO TBEC
5.0000 mg | DELAYED_RELEASE_TABLET | Freq: Once | ORAL | Status: AC
  Administered 2017-07-01: 5 mg via ORAL
  Filled 2017-07-01: qty 1

## 2017-07-01 MED ORDER — POLYETHYLENE GLYCOL 3350 17 G PO PACK
17.0000 g | PACK | Freq: Every day | ORAL | Status: DC
  Administered 2017-07-01: 17 g via ORAL
  Filled 2017-07-01 (×2): qty 1

## 2017-07-01 NOTE — Progress Notes (Signed)
Patient given 5mg  midodrine this AM, NP made aware. NP with orders to d/c patient after q1h BP monitoring x3.

## 2017-07-01 NOTE — Progress Notes (Signed)
PROGRESS NOTE  Sean Reyes WCB:[id number] DOB: 1940/08/28 DOA: 06/27/2017 PCP: [name], MD   LOS: 4 days   Brief Narrative / Interim history: 77 year old male with history of A. fib status post EP ablation, multiple prior surgeries complicated by postop hypotension, lightheadedness and dizziness, type 2 diabetes mellitus, OSA, who was admitted on neurosurgery service for anterolateral interbody fusion L2-3 L3-4.  Medicine was consulted for persistent hypotension/orthostasis.  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)   OSA (obstructive sleep apnea)   BPH with obstruction/lower urinary tract symptoms   Low testosterone   Neurologic orthostatic hypotension (HCC)   Orthostatic hypotension, postop -Patient's blood pressure was noted to drop into the 80s on standing, symptomatic -He has a history of significant postoperative orthostatic hypotension each time he underwent the procedure -Likely due to autonomic neuropathy in the setting of long-standing diabetes mellitus -He was started on Midorine, he is asymptomatic today, seems to have helped, will need to continue for couple weeks postop, will need close follow-up with outpatient MD when this medication will be stopped -Cortisol normal  Diabetes mellitus with autonomic neuropathy -Generally well controlled but that does not rule out developmental complications -continue home regimen   Paroxysmal A. fib -Continue propafenone, status post multiple ablations -He is on Eliquis at home, resume Eliquis postop as per neurosurgery recommendations  HTN -On benazepril, metoprolol, Norvasc at home -I am going to reintroduce metoprolol for A. fib purposes,  hold benazepril and Norvasc since patient will need midodrine.  Recommend patient monitors his blood pressure at home and will need to be in close discussions with the PCP regarding blood pressure trends, symptoms and when he can be taken off midodrine and resumed on amlodipine and benazepril  Low testosterone levels -d/c Clomid on d/c, outpatient follow up    Okay to discharge from medicine standpoint  DVT prophylaxis: per primary Code Status: Full code Family Communication: no family bedside Disposition Plan: home per neurosurgery    Procedures:  anterolateral interbody fusion L2-3 L3-4  Antimicrobials:  None    Subjective: -complains of hip pain. No longer lightheaded and dizzy  Objective: Vitals:   06/30/17 2300 07/01/17 0000 07/01/17 0300 07/01/17 0800  BP: 139/74 139/74 (!) 143/76 132/63  Pulse: 76 79 76 86  Resp: 15  16 18   Temp: 98.3 F (36.8 C)  98.7 F (37.1 C) 98.3 F (36.8 C)  TempSrc: Oral  Oral Oral  SpO2: 97% 97% 95% 97%  Weight:      Height:        Intake/Output Summary (Last 24 hours) at 07/01/2017 1028 Last data filed at 07/01/2017 0800 Gross per 24 hour  Intake -  Output 800 ml  Net -800 ml   Filed Weights   06/27/17 0651  Weight: 88.3 kg (194 lb 11.2 oz)    Examination:  Constitutional: no distress Respiratory: CTA Cardiovascular: RRR  Data Reviewed: I have independently reviewed following labs and imaging studies   CBC: Recent Labs  Lab 06/28/17 1149  WBC 11.8*  HGB 10.0*  HCT 30.0*  MCV 95.2  PLT 616*   Basic Metabolic Panel: Recent Labs  Lab 06/28/17 1149 06/29/17 0817  NA 138 139  K 4.1 4.0  CL 101 104  CO2 26 30  GLUCOSE 220* 172*  BUN 22* 14  CREATININE 1.55* 1.23  CALCIUM 7.7* 8.0*   GFR: Estimated Creatinine Clearance: 59.5 mL/min (by C-G formula based on SCr of 1.23 mg/dL). Liver Function Tests: No results for input(s): AST, ALT, ALKPHOS, BILITOT, PROT, ALBUMIN in the last 168 hours. No results for  input(s): LIPASE, AMYLASE in the last 168 hours. No results for input(s): AMMONIA in the last 168 hours. Coagulation Profile: Recent Labs  Lab 06/27/17 0705  INR 1.09   Cardiac Enzymes: No results for input(s): CKTOTAL, CKMB, CKMBINDEX, TROPONINI in the last 168 hours. BNP (last 3 results) No results for input(s): PROBNP in the last 8760 hours. HbA1C: No results for input(s): HGBA1C in the last 72 hours. CBG: Recent Labs  Lab 06/30/17 0853 06/30/17 1241 06/30/17 1752 06/30/17 2152 07/01/17 0857  GLUCAP 140* 162* 184* 129* 154*   Lipid Profile: No results for input(s): CHOL, HDL, LDLCALC, TRIG, CHOLHDL, LDLDIRECT in the last 72 hours. Thyroid Function Tests: No results for input(s): TSH, T4TOTAL, FREET4, T3FREE, THYROIDAB in the last 72 hours. Anemia Panel: No results for input(s): VITAMINB12, FOLATE, FERRITIN, TIBC, IRON, RETICCTPCT in the last 72 hours. Urine analysis:    Component Value Date/Time   APPEARANCEUR Clear 01/08/2017 1328   GLUCOSEU Trace 01/08/2017 1328   BILIRUBINUR Negative 01/08/2017 1328   PROTEINUR 1+ (A) 01/08/2017 1328   NITRITE Negative 01/08/2017 1328   LEUKOCYTESUR Negative 01/08/2017 1328   Sepsis Labs: Invalid input(s): PROCALCITONIN, LACTICIDVEN  No results found for this or any previous visit (from the past 240 hour(s)).    Radiology Studies: No results found.   Scheduled Meds: . docusate sodium  100 mg Oral Daily  . finasteride  5 mg Oral QHS  . insulin aspart  0-5 Units Subcutaneous QHS  . insulin aspart  0-9 Units Subcutaneous TID WC  . metFORMIN  1,000 mg Oral BID  . metoprolol succinate  25 mg Oral Daily  . midodrine  2.5 mg Oral TID WC  . pantoprazole  40 mg Oral Daily  . polyethylene glycol  17 g Oral Daily  . propafenone  450 mg Oral BID  . senna-docusate  1 tablet Oral BID   Continuous Infusions:     Marzetta Board, MD, PhD [facility] Pager [phone]  If 7PM-7AM, please contact  night-coverage [email or web address] Password [facility] 07/01/2017, 10:28 AM

## 2017-07-01 NOTE — Discharge Summary (Signed)
Physician Discharge Summary  Patient ID: Sean Reyes MRN: [id number] DOB/AGE: 07/19/1940 77 y.o.  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 The patient tolerated the procedure well and was taken to the recovery room and then to the floor in stable condition. The hospital course was routine. There were no complications. The wound remained clean dry and intact. Pt had appropriate back soreness. No complaints of leg pain or new N/T/W. The patient remained afebrile with stable vital signs, and tolerated a regular diet. The patient continued to increase activities, and pain was well controlled with oral pain medications.   Consults: triad hospitalist  Significant Diagnostic Studies:  Results for orders placed or performed during the hospital encounter of 06/27/17  Protime-INR  Result Value Ref Range   Prothrombin Time 14.0 11.4 - 15.2 seconds   INR 1.09   Glucose, capillary  Result Value Ref Range   Glucose-Capillary 119 (H) 65 - 99 mg/dL  Glucose, capillary  Result Value Ref Range   Glucose-Capillary 166 (H) 65 - 99 mg/dL  Glucose, capillary  Result Value Ref Range   Glucose-Capillary 189 (H) 65 - 99 mg/dL  Basic metabolic panel  Result Value Ref Range   Sodium 138 135 - 145 mmol/L   Potassium 4.1 3.5 - 5.1 mmol/L   Chloride 101 101 - 111 mmol/L   CO2 26 22 - 32 mmol/L   Glucose, Bld 220 (H) 65 - 99 mg/dL   BUN 22 (H) 6 - 20 mg/dL   Creatinine, Ser 1.55 (H) 0.61 - 1.24 mg/dL   Calcium 7.7 (L) 8.9 - 10.3 mg/dL   GFR calc non Af Amer 42 (L) >60 mL/min   GFR calc Af Amer 49 (L) >60 mL/min   Anion gap 11 5 - 15  CBC  Result Value Ref Range   WBC 11.8 (H) 4.0 - 10.5 K/uL    RBC 3.15 (L) 4.22 - 5.81 MIL/uL   Hemoglobin 10.0 (L) 13.0 - 17.0 g/dL   HCT 30.0 (L) 39.0 - 52.0 %   MCV 95.2 78.0 - 100.0 fL   MCH 31.7 26.0 - 34.0 pg   MCHC 33.3 30.0 - 36.0 g/dL   RDW 13.3 11.5 - 15.5 %   Platelets 129 (L) 150 - 400 K/uL  Glucose, capillary  Result Value Ref Range   Glucose-Capillary 204 (H) 65 - 99 mg/dL  Glucose, capillary  Result Value Ref Range   Glucose-Capillary 248 (H) 65 - 99 mg/dL  Glucose, capillary  Result Value Ref Range   Glucose-Capillary 228 (H) 65 - 99 mg/dL  Glucose, capillary  Result Value Ref Range   Glucose-Capillary 194 (H) 65 - 99 mg/dL  Basic metabolic panel  Result Value Ref Range   Sodium 139 135 - 145 mmol/L   Potassium 4.0 3.5 - 5.1 mmol/L   Chloride 104 101 - 111 mmol/L   CO2 30 22 - 32 mmol/L   Glucose, Bld 172 (H) 65 - 99 mg/dL   BUN 14 6 - 20 mg/dL   Creatinine, Ser 1.23 0.61 - 1.24 mg/dL   Calcium 8.0 (L) 8.9 - 10.3 mg/dL   GFR calc non Af Amer 56 (L) >60 mL/min   GFR calc Af Amer >60 >60 mL/min  Anion gap 5 5 - 15  Glucose, capillary  Result Value Ref Range   Glucose-Capillary 161 (H) 65 - 99 mg/dL  Glucose, capillary  Result Value Ref Range   Glucose-Capillary 166 (H) 65 - 99 mg/dL  Glucose, capillary  Result Value Ref Range   Glucose-Capillary 178 (H) 65 - 99 mg/dL  Cortisol  Result Value Ref Range   Cortisol, Plasma 14.8 ug/dL  Glucose, capillary  Result Value Ref Range   Glucose-Capillary 140 (H) 65 - 99 mg/dL  Glucose, capillary  Result Value Ref Range   Glucose-Capillary 162 (H) 65 - 99 mg/dL  Glucose, capillary  Result Value Ref Range   Glucose-Capillary 184 (H) 65 - 99 mg/dL  Glucose, capillary  Result Value Ref Range   Glucose-Capillary 129 (H) 65 - 99 mg/dL    Dg Lumbar Spine Complete  Result Date: 06/27/2017 CLINICAL DATA:  L2 through L5  PLIF EXAM: DG C-ARM 61-120 MIN; LUMBAR SPINE - COMPLETE 4+ VIEW COMPARISON:  CT 03/30/2017 FINDINGS: Four low resolution intraoperative spot views of the  lumbar spine. Total fluoroscopy time was 6 minutes 3 seconds. Images demonstrate placement of fixating screws and interbody devices within the lumbar spine. IMPRESSION: Intraoperative fluoroscopic assistance provided during lumbar spine surgery. Electronically Signed   By: Donavan Foil M.D.   On: 06/27/2017 20:24   Dg C-arm 1-60 Min  Result Date: 06/27/2017 CLINICAL DATA:  L2 through L5  PLIF EXAM: DG C-ARM 61-120 MIN; LUMBAR SPINE - COMPLETE 4+ VIEW COMPARISON:  CT 03/30/2017 FINDINGS: Four low resolution intraoperative spot views of the lumbar spine. Total fluoroscopy time was 6 minutes 3 seconds. Images demonstrate placement of fixating screws and interbody devices within the lumbar spine. IMPRESSION: Intraoperative fluoroscopic assistance provided during lumbar spine surgery. Electronically Signed   By: Donavan Foil M.D.   On: 06/27/2017 20:24   Dg C-arm 1-60 Min  Result Date: 06/27/2017 CLINICAL DATA:  L2 through L5  PLIF EXAM: DG C-ARM 61-120 MIN; LUMBAR SPINE - COMPLETE 4+ VIEW COMPARISON:  CT 03/30/2017 FINDINGS: Four low resolution intraoperative spot views of the lumbar spine. Total fluoroscopy time was 6 minutes 3 seconds. Images demonstrate placement of fixating screws and interbody devices within the lumbar spine. IMPRESSION: Intraoperative fluoroscopic assistance provided during lumbar spine surgery. Electronically Signed   By: Donavan Foil M.D.   On: 06/27/2017 20:24   Dg C-arm 1-60 Min  Result Date: 06/27/2017 CLINICAL DATA:  L2 through L5  PLIF EXAM: DG C-ARM 61-120 MIN; LUMBAR SPINE - COMPLETE 4+ VIEW COMPARISON:  CT 03/30/2017 FINDINGS: Four low resolution intraoperative spot views of the lumbar spine. Total fluoroscopy time was 6 minutes 3 seconds. Images demonstrate placement of fixating screws and interbody devices within the lumbar spine. IMPRESSION: Intraoperative fluoroscopic assistance provided during lumbar spine surgery. Electronically Signed   By: Donavan Foil M.D.   On:  06/27/2017 20:24    Antibiotics:  Anti-infectives (From admission, onward)   Start     Dose/Rate Route Frequency Ordered Stop   06/27/17 1815  ceFAZolin (ANCEF) IVPB 2g/100 mL premix     2 g 200 mL/hr over 30 Minutes Intravenous Every 8 hours 06/27/17 1807 06/29/17 1159   06/27/17 1603  vancomycin (VANCOCIN) powder  Status:  Discontinued       As needed 06/27/17 1603 06/27/17 1629   06/27/17 0938  bacitracin 50,000 Units in sodium chloride 0.9 % 500 mL irrigation  Status:  Discontinued       As needed 06/27/17 0939 06/27/17 1629  06/27/17 0645  ceFAZolin (ANCEF) IVPB 2g/100 mL premix     2 g 200 mL/hr over 30 Minutes Intravenous On call to O.R. 06/27/17 0636 06/27/17 1245      Discharge Exam: Blood pressure 132/63, pulse 86, temperature 98.3 F (36.8 C), temperature source Oral, resp. rate 18, height 6\' 1"  (1.854 m), weight 194 lb 11.2 oz (88.3 kg), SpO2 97 %. Neurologic: Grossly normal Ambulating and voiding well, incision CDI  Discharge Medications:   Allergies as of 07/01/2017      Reactions   Levaquin [levofloxacin In D5w] Anaphylaxis, Shortness Of Breath   Shellfish Allergy Anaphylaxis   Amiodarone Other (See Comments)   Tremors and thyroid toxicity      Medication List    STOP taking these medications   apixaban 5 MG Tabs tablet Commonly known as:  ELIQUIS   clomiPHENE 50 MG tablet Commonly known as:  CLOMID     TAKE these medications   amLODipine 5 MG tablet Commonly known as:  NORVASC Take 1 tablet (5 mg total) by mouth daily.   benazepril 40 MG tablet Commonly known as:  LOTENSIN Take 1 tablet (40 mg total) by mouth daily.   cyclobenzaprine 10 MG tablet Commonly known as:  FLEXERIL Take 1 tablet (10 mg total) by mouth 3 (three) times daily as needed for muscle spasms.   finasteride 5 MG tablet Commonly known as:  PROSCAR Take 1 tablet (5 mg total) by mouth daily. What changed:  when to take this   HYDROcodone-acetaminophen 5-325 MG  tablet Commonly known as:  NORCO/VICODIN Take 1-2 tablets by mouth every 4 (four) hours as needed for moderate pain.   metFORMIN 500 MG 24 hr tablet Commonly known as:  GLUCOPHAGE-XR 2 tabs in AM and PM What changed:    how much to take  how to take this  when to take this  additional instructions   metoprolol succinate 25 MG 24 hr tablet Commonly known as:  TOPROL-XL Take 1 tablet (25 mg total) by mouth 2 (two) times daily.   propafenone 425 MG 12 hr capsule Commonly known as:  RYTHMOL SR Take 425 mg by mouth 2 (two) times daily.       Disposition: home   Final Dx: xlif L2-3,3-4, PLIF L4-5  Discharge Instructions    Diet - low sodium heart healthy   Complete by:  As directed    Increase activity slowly   Complete by:  As directed          Signed: Ocie Cornfield Apolonia Ellwood 07/01/2017, 8:10 AM

## 2017-07-01 NOTE — Care Management Note (Signed)
Case Management Note  Patient Details  Name: Sean Reyes MRN: [id number] Date of Birth: Jun 17, 1940  Subjective/Objective:    Pt presented for lumbar fusion.  Pt independent, from home with spouse.        Action/Plan: [name] wife states they have used [facility] before and would like to use them again.  Jermaine with [facility] given referral.  They have DME from wife's prior surgery and will not have any DME needs.   Expected Discharge Date:  07/01/17               Expected Discharge Plan:  [place]  In-House Referral:  NA  Discharge planning Services  CM Consult  Post Acute Care Choice:  Home Health Choice offered to:  Patient, Spouse, Adult Children  DME Arranged:  N/A DME Agency:  NA  HH Arranged:  PT [place] Agency:  [place]  Status of Service:  Completed, signed off  If discussed at [place] of Stay Meetings, dates discussed:    Additional Comments:  Claudie Leach, RN 07/01/2017, 10:16 AM

## 2017-07-01 NOTE — Progress Notes (Signed)
MD office paged. Patient stating he does not feel ready to go home, feels very weak and would like to stay today.  NP called back, made aware of pt wishes and updated on patient stable BP.  NP cancelled patient discharge for today. Continue monitoring patient.

## 2017-07-02 LAB — GLUCOSE, CAPILLARY: Glucose-Capillary: 145 mg/dL — ABNORMAL HIGH (ref 65–99)

## 2017-07-02 NOTE — Progress Notes (Signed)
Patient discharge instructions given to patient, wife and daughter including printed prescriptions.  Questions answered.  IVs removed.  No equipment to be delivered.  Patient taken to car via wheelchair with all belongings.

## 2017-07-02 NOTE — Progress Notes (Signed)
NEUROSURGERY PROGRESS NOTE  Doing well. Complains of appropriate back soreness. Feels stronger today and wants to be d/c home Incision CDI  Temp:  [98.5 F (36.9 C)-98.7 F (37.1 C)] 98.7 F (37.1 C) (05/20 0300) Pulse Rate:  [76-82] 77 (05/20 0300) Resp:  [14-16] 14 (05/20 0300) BP: (116-147)/(72-95) 121/82 (05/20 0300) SpO2:  [95 %-98 %] 95 % (05/20 0300)  Plan: Will plan to d/c home today  Sean Chiquito, NP 07/02/2017 8:10 AM

## 2017-07-02 NOTE — Progress Notes (Signed)
PROGRESS NOTE  Yogesh Cominsky TSV:[id number] DOB: 09-22-1940 DOA: 06/27/2017 PCP: [name], MD   LOS: 5 days   Brief Narrative / Interim history: 77 year old male with history of A. fib status post EP ablation, multiple prior surgeries complicated by postop hypotension, lightheadedness and dizziness, type 2 diabetes mellitus, OSA, who was admitted on neurosurgery service for anterolateral interbody fusion L2-3 L3-4.  Medicine was consulted for persistent hypotension/orthostasis.  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)   OSA (obstructive sleep apnea)   BPH with obstruction/lower urinary tract symptoms   Low testosterone   Neurologic orthostatic hypotension (HCC)   Orthostatic hypotension, postop -Patient's blood pressure was noted to drop into the 80s on standing, symptomatic -He has a history of significant postoperative orthostatic hypotension each time he underwent the procedure -Likely due to autonomic neuropathy in the setting of long-standing diabetes mellitus -He was started on Midorine, BP now on the high side, resume home medications -Cortisol normal  Diabetes mellitus with autonomic neuropathy -continue home regimen   Paroxysmal A. fib -Continue propafenone, status post multiple ablations -He is on Eliquis at home, resume Eliquis postop as per neurosurgery recommendations  HTN -On benazepril, metoprolol, Norvasc at home  Low testosterone levels -d/c Clomid on d/c, outpatient follow up    Okay to discharge from medicine standpoint  DVT prophylaxis: per primary Code Status: Full code Family Communication: no family bedside Disposition Plan: home per neurosurgery     Procedures:  anterolateral interbody fusion L2-3 L3-4  Antimicrobials:  None    Subjective: -feels better. Home today   Objective: Vitals:   07/01/17 2300 07/02/17 0300 07/02/17 0821 07/02/17 0900  BP: (!) 147/77 121/82 (!) 150/76   Pulse: 82 77 80   Resp: 16 14    Temp: 98.5 F (36.9 C) 98.7 F (37.1 C)  98.6 F (37 C)  TempSrc: Oral Oral    SpO2: 97% 95% 96%   Weight:      Height:        Intake/Output Summary (Last 24 hours) at 07/02/2017 1040 Last data filed at 07/02/2017 1015 Gross per 24 hour  Intake 480 ml  Output 780 ml  Net -300 ml   Filed Weights   06/27/17 0651  Weight: 88.3 kg (194 lb 11.2 oz)    Examination:  Constitutional: NAD Respiratory: CTA Cardiovascular: RRR  Data Reviewed: I have independently reviewed following labs and imaging studies   CBC: Recent Labs  Lab 06/28/17 1149  WBC 11.8*  HGB 10.0*  HCT 30.0*  MCV 95.2  PLT 923*   Basic Metabolic Panel: Recent Labs  Lab 06/28/17 1149 06/29/17 0817  NA 138 139  K 4.1 4.0  CL 101 104  CO2 26 30  GLUCOSE 220* 172*  BUN 22* 14  CREATININE 1.55* 1.23  CALCIUM 7.7* 8.0*   GFR: Estimated Creatinine Clearance: 59.5 mL/min (by C-G formula based on SCr of 1.23 mg/dL). Liver Function Tests: No results for input(s): AST, ALT, ALKPHOS, BILITOT, PROT, ALBUMIN in the last 168 hours. No results for input(s): LIPASE, AMYLASE in the last 168 hours. No results for input(s): AMMONIA in the last 168 hours. Coagulation Profile: Recent Labs  Lab 06/27/17 0705  INR 1.09   Cardiac Enzymes: No  results for input(s): CKTOTAL, CKMB, CKMBINDEX, TROPONINI in the last 168 hours. BNP (last 3 results) No results for input(s): PROBNP in the last 8760 hours. HbA1C: No results for input(s): HGBA1C in the last 72 hours. CBG: Recent Labs  Lab 07/01/17 0857 07/01/17 1329 07/01/17 1736 07/01/17 2248 07/02/17 0804  GLUCAP 154* 157* 135* 177* 145*   Lipid Profile: No results for input(s):  CHOL, HDL, LDLCALC, TRIG, CHOLHDL, LDLDIRECT in the last 72 hours. Thyroid Function Tests: No results for input(s): TSH, T4TOTAL, FREET4, T3FREE, THYROIDAB in the last 72 hours. Anemia Panel: No results for input(s): VITAMINB12, FOLATE, FERRITIN, TIBC, IRON, RETICCTPCT in the last 72 hours. Urine analysis:    Component Value Date/Time   APPEARANCEUR Clear 01/08/2017 1328   GLUCOSEU Trace 01/08/2017 1328   BILIRUBINUR Negative 01/08/2017 1328   PROTEINUR 1+ (A) 01/08/2017 1328   NITRITE Negative 01/08/2017 1328   LEUKOCYTESUR Negative 01/08/2017 1328   Sepsis Labs: Invalid input(s): PROCALCITONIN, LACTICIDVEN  No results found for this or any previous visit (from the past 240 hour(s)).    Radiology Studies: No results found.   Scheduled Meds: . docusate sodium  100 mg Oral Daily  . finasteride  5 mg Oral QHS  . insulin aspart  0-5 Units Subcutaneous QHS  . insulin aspart  0-9 Units Subcutaneous TID WC  . metFORMIN  1,000 mg Oral BID  . metoprolol succinate  25 mg Oral Daily  . midodrine  2.5 mg Oral TID WC  . pantoprazole  40 mg Oral Daily  . polyethylene glycol  17 g Oral Daily  . propafenone  450 mg Oral BID  . senna-docusate  1 tablet Oral BID   Continuous Infusions:     Marzetta Board, MD, PhD [facility] Pager [phone]  If 7PM-7AM, please contact night-coverage [email or web address] Password [email or web address] 07/02/2017, 10:40 AM

## 2017-07-03 MED FILL — Heparin Sodium (Porcine) Inj 1000 Unit/ML: INTRAMUSCULAR | Qty: 30 | Status: AC

## 2017-07-03 MED FILL — Sodium Chloride IV Soln 0.9%: INTRAVENOUS | Qty: 1000 | Status: AC

## 2017-07-08 DIAGNOSIS — R030 Elevated blood-pressure reading, without diagnosis of hypertension: Secondary | ICD-10-CM | POA: Diagnosis not present

## 2017-07-08 DIAGNOSIS — Z981 Arthrodesis status: Secondary | ICD-10-CM | POA: Diagnosis not present

## 2017-07-08 DIAGNOSIS — Z7901 Long term (current) use of anticoagulants: Secondary | ICD-10-CM | POA: Diagnosis not present

## 2017-07-08 DIAGNOSIS — E119 Type 2 diabetes mellitus without complications: Secondary | ICD-10-CM | POA: Diagnosis not present

## 2017-07-08 DIAGNOSIS — Z7984 Long term (current) use of oral hypoglycemic drugs: Secondary | ICD-10-CM | POA: Diagnosis not present

## 2017-07-08 DIAGNOSIS — Z6825 Body mass index (BMI) 25.0-25.9, adult: Secondary | ICD-10-CM | POA: Diagnosis not present

## 2017-07-08 DIAGNOSIS — R3981 Functional urinary incontinence: Secondary | ICD-10-CM | POA: Diagnosis not present

## 2017-07-08 DIAGNOSIS — Z4789 Encounter for other orthopedic aftercare: Secondary | ICD-10-CM | POA: Diagnosis not present

## 2017-07-08 DIAGNOSIS — M47816 Spondylosis without myelopathy or radiculopathy, lumbar region: Secondary | ICD-10-CM | POA: Diagnosis not present

## 2017-07-11 DIAGNOSIS — R030 Elevated blood-pressure reading, without diagnosis of hypertension: Secondary | ICD-10-CM | POA: Diagnosis not present

## 2017-07-11 DIAGNOSIS — R3981 Functional urinary incontinence: Secondary | ICD-10-CM | POA: Diagnosis not present

## 2017-07-11 DIAGNOSIS — E119 Type 2 diabetes mellitus without complications: Secondary | ICD-10-CM | POA: Diagnosis not present

## 2017-07-11 DIAGNOSIS — M47816 Spondylosis without myelopathy or radiculopathy, lumbar region: Secondary | ICD-10-CM | POA: Diagnosis not present

## 2017-07-11 DIAGNOSIS — Z981 Arthrodesis status: Secondary | ICD-10-CM | POA: Diagnosis not present

## 2017-07-11 DIAGNOSIS — Z4789 Encounter for other orthopedic aftercare: Secondary | ICD-10-CM | POA: Diagnosis not present

## 2017-07-13 ENCOUNTER — Encounter: Payer: Self-pay | Admitting: Family Medicine

## 2017-07-16 DIAGNOSIS — Z981 Arthrodesis status: Secondary | ICD-10-CM | POA: Diagnosis not present

## 2017-07-16 DIAGNOSIS — Z4789 Encounter for other orthopedic aftercare: Secondary | ICD-10-CM | POA: Diagnosis not present

## 2017-07-16 DIAGNOSIS — M47816 Spondylosis without myelopathy or radiculopathy, lumbar region: Secondary | ICD-10-CM | POA: Diagnosis not present

## 2017-07-16 DIAGNOSIS — R030 Elevated blood-pressure reading, without diagnosis of hypertension: Secondary | ICD-10-CM | POA: Diagnosis not present

## 2017-07-16 DIAGNOSIS — E119 Type 2 diabetes mellitus without complications: Secondary | ICD-10-CM | POA: Diagnosis not present

## 2017-07-16 DIAGNOSIS — R3981 Functional urinary incontinence: Secondary | ICD-10-CM | POA: Diagnosis not present

## 2017-07-17 ENCOUNTER — Telehealth: Payer: Self-pay | Admitting: Urology

## 2017-07-17 ENCOUNTER — Encounter: Payer: Self-pay | Admitting: Urology

## 2017-07-17 ENCOUNTER — Ambulatory Visit (INDEPENDENT_AMBULATORY_CARE_PROVIDER_SITE_OTHER): Payer: Medicare Other | Admitting: Urology

## 2017-07-17 VITALS — BP 134/74 | HR 82 | Resp 16 | Ht 73.0 in | Wt 201.0 lb

## 2017-07-17 DIAGNOSIS — R350 Frequency of micturition: Secondary | ICD-10-CM

## 2017-07-17 DIAGNOSIS — R351 Nocturia: Secondary | ICD-10-CM | POA: Diagnosis not present

## 2017-07-17 LAB — BLADDER SCAN AMB NON-IMAGING

## 2017-07-17 NOTE — Progress Notes (Signed)
07/17/2017 3:36 PM   Jules Schick 1940/04/19 [id number]  Referring provider: [name], MD [address] [place], [place] [address]  Chief Complaint  Patient presents with  . Urinary Frequency    HPI: 77 year old male who has previously seen [name] and Dr. Matilde Sprang for lower urinary tract symptoms with nocturia being his most bothersome symptom.  He failed Toviaz, Myrbetriq and desmopressin nasal spray.  He is also been on finasteride and is no longer taking but does continue on tamsulosin.  He has intermittent episodes of nocturia 8-10 times per night which typically resolve.  He presents today with onset of symptoms approximately 1 week ago.  He is presently using a condom catheter.  He states he is getting up every hour to void but only voids a small amount.  He has no bothersome daytime symptoms.  At his last visit with Dr. Matilde Sprang PTNS was recommended if he desired therapy.  He denies dysuria or gross hematuria.  He denies flank, abdominal, pelvic or scrotal pain.  He did have lower lumbar spine surgery 2 weeks ago.   PMH: Past Medical History:  Diagnosis Date  . Anemia   . Anxiety   . Arthritis   . Atrial fibrillation ([place])   . Complication of anesthesia    pt reports low BP's after surgery at [facility] and difficulty awakening  . Depression   . Diabetes ([place])   . Dysrhythmia   . GERD (gastroesophageal reflux disease)    OCC TAKES ALKA SELTZER  . Hyperlipidemia   . Hypertension   . Nocturia   . S/P ablation of atrial fibrillation    Ablative therapy  . Sleep apnea    CPAP   . Tachycardia, unspecified     Surgical History: Past Surgical History:  Procedure Laterality Date  . ABLATION    . ANTERIOR LAT LUMBAR FUSION N/A 06/27/2017   Procedure: Anterior Lateral Lumbar Interbody  Fusion - Lumbar Two-Lumbar Three - Lumbar Three-Lumbar Four, Posterior Lumbar Interbody Fusion Lumbar Four- Five;  Surgeon: Kary Kos, MD;  Location: [facility];  Service:  Neurosurgery;  Laterality: N/A;  Anterior Lateral Lumbar Interbody  Fusion - Lumbar Two-Lumbar Three - Lumbar Three-Lumbar Four, Posterior Lumbar Interbody Fusion Lumbar Four- Five  . COLONOSCOPY WITH PROPOFOL N/A 10/05/2015   Procedure: COLONOSCOPY WITH PROPOFOL;  Surgeon: Lollie Sails, MD;  Location: [facility] ENDOSCOPY;  Service: Endoscopy;  Laterality: N/A;  . HERNIA REPAIR    . JOINT REPLACEMENT Bilateral    hips  RT+  LEFT X2   . LUMBAR LAMINECTOMY/DECOMPRESSION MICRODISCECTOMY Left 09/13/2016   Procedure: Microdiscectomy - Lumbar two-three,  Lumbar three- - left;  Surgeon: Kary Kos, MD;  Location: [facility];  Service: Neurosurgery;  Laterality: Left;  . TONSILLECTOMY      Home Medications:  Allergies as of 07/17/2017      Reactions   Levaquin [levofloxacin In D5w] Anaphylaxis, Shortness Of Breath   Shellfish Allergy Anaphylaxis   Amiodarone Other (See Comments)   Tremors and thyroid toxicity      Medication List        Accurate as of 07/17/17  3:36 PM. Always use your most recent med list.          benazepril 40 MG tablet Commonly known as:  LOTENSIN Take 1 tablet (40 mg total) by mouth daily.   cyclobenzaprine 10 MG tablet Commonly known as:  FLEXERIL Take 1 tablet (10 mg total) by mouth 3 (three) times daily as needed for muscle spasms.  ELIQUIS 5 MG Tabs tablet Generic drug:  apixaban   finasteride 5 MG tablet Commonly known as:  PROSCAR Take 1 tablet (5 mg total) by mouth daily.   HYDROcodone-acetaminophen 5-325 MG tablet Commonly known as:  NORCO/VICODIN Take 1-2 tablets by mouth every 4 (four) hours as needed for moderate pain.   metFORMIN 500 MG 24 hr tablet Commonly known as:  GLUCOPHAGE-XR 2 tabs in AM and PM   metoprolol succinate 25 MG 24 hr tablet Commonly known as:  TOPROL-XL Take 1 tablet (25 mg total) by mouth 2 (two) times daily.   propafenone 425 MG 12 hr capsule Commonly known as:  RYTHMOL SR Take 425 mg by mouth 2 (two) times daily.     tiZANidine 4 MG tablet Commonly known as:  ZANAFLEX       Allergies:  Allergies  Allergen Reactions  . Levaquin [Levofloxacin In D5w] Anaphylaxis and Shortness Of Breath  . Shellfish Allergy Anaphylaxis  . Amiodarone Other (See Comments)    Tremors and thyroid toxicity    Family History: Family History  Problem Relation Age of Onset  . Brain cancer Mother   . Kidney disease Neg Hx   . Prostate cancer Neg Hx   . Kidney cancer Neg Hx   . Bladder Cancer Neg Hx     Social History:  reports that he has never smoked. He has never used smokeless tobacco. He reports that he does not drink alcohol or use drugs.  ROS: UROLOGY Frequent Urination?: Yes Hard to postpone urination?: Yes Burning/pain with urination?: No Get up at night to urinate?: Yes Leakage of urine?: No Urine stream starts and stops?: No Trouble starting stream?: No Do you have to strain to urinate?: No Blood in urine?: No Urinary tract infection?: No Sexually transmitted disease?: No Injury to kidneys or bladder?: No Painful intercourse?: No Weak stream?: No Erection problems?: No Penile pain?: No  Gastrointestinal Nausea?: No Vomiting?: No Indigestion/heartburn?: No Diarrhea?: No Constipation?: No  Constitutional Fever: No Night sweats?: No Weight loss?: Yes Fatigue?: No  Skin Skin rash/lesions?: No Itching?: No  Eyes Blurred vision?: No Double vision?: No  Ears/Nose/Throat Sore throat?: No Sinus problems?: No  Hematologic/Lymphatic Swollen glands?: No Easy bruising?: No  Cardiovascular Leg swelling?: No Chest pain?: No  Respiratory Cough?: No Shortness of breath?: No  Endocrine Excessive thirst?: No  Musculoskeletal Back pain?: Yes Joint pain?: No  Neurological Headaches?: No Dizziness?: No  Psychologic Depression?: No Anxiety?: No  Physical Exam: BP 134/74   Pulse 82   Resp 16   Ht 6\' 1"  (1.854 m)   Wt 201 lb (91.2 kg)   SpO2 96%   BMI 26.52 kg/m    Constitutional:  Alert and oriented, No acute distress. HEENT: [place] AT, moist mucus membranes.  Trachea midline, no masses. Cardiovascular: No clubbing, cyanosis, or edema. Respiratory: Normal respiratory effort, no increased work of breathing. GI: Abdomen is soft, nontender, nondistended, no abdominal masses GU: No CVA tenderness Lymph: No cervical or inguinal lymphadenopathy. Skin: No rashes, bruises or suspicious lesions. Neurologic: Grossly intact, no focal deficits, moving all 4 extremities. Psychiatric: Normal mood and affect.   Assessment & Plan:   77 year old male with bothersome nocturia which has been intermittent.  He has failed multiple medications.  I discussed his previous visits with Dr. Matilde Sprang and recommendation of PTNS.  He was given literature and will see if insurance will approve.   Abbie Sons, [facility] [address] [place],  [place] [address] [phone]

## 2017-07-17 NOTE — Telephone Encounter (Signed)
Called patient to schedule 12 visits of PTNS therapy.  Patient stated that he would like to think about it.  He will call back when ready to schedule.

## 2017-07-17 NOTE — Telephone Encounter (Signed)
PTNS - 12 visits  Medicare & BCBS Supplement plans do NOT require prior authorization.  Ok to schedule appointments, starting in July.

## 2017-07-19 DIAGNOSIS — M47816 Spondylosis without myelopathy or radiculopathy, lumbar region: Secondary | ICD-10-CM | POA: Diagnosis not present

## 2017-07-19 DIAGNOSIS — R030 Elevated blood-pressure reading, without diagnosis of hypertension: Secondary | ICD-10-CM | POA: Diagnosis not present

## 2017-07-19 DIAGNOSIS — E119 Type 2 diabetes mellitus without complications: Secondary | ICD-10-CM | POA: Diagnosis not present

## 2017-07-19 DIAGNOSIS — Z981 Arthrodesis status: Secondary | ICD-10-CM | POA: Diagnosis not present

## 2017-07-19 DIAGNOSIS — Z4789 Encounter for other orthopedic aftercare: Secondary | ICD-10-CM | POA: Diagnosis not present

## 2017-07-19 DIAGNOSIS — R3981 Functional urinary incontinence: Secondary | ICD-10-CM | POA: Diagnosis not present

## 2017-07-23 DIAGNOSIS — R030 Elevated blood-pressure reading, without diagnosis of hypertension: Secondary | ICD-10-CM | POA: Diagnosis not present

## 2017-07-23 DIAGNOSIS — R3981 Functional urinary incontinence: Secondary | ICD-10-CM | POA: Diagnosis not present

## 2017-07-23 DIAGNOSIS — E119 Type 2 diabetes mellitus without complications: Secondary | ICD-10-CM | POA: Diagnosis not present

## 2017-07-23 DIAGNOSIS — M47816 Spondylosis without myelopathy or radiculopathy, lumbar region: Secondary | ICD-10-CM | POA: Diagnosis not present

## 2017-07-23 DIAGNOSIS — Z981 Arthrodesis status: Secondary | ICD-10-CM | POA: Diagnosis not present

## 2017-07-23 DIAGNOSIS — Z4789 Encounter for other orthopedic aftercare: Secondary | ICD-10-CM | POA: Diagnosis not present

## 2017-07-25 DIAGNOSIS — Z981 Arthrodesis status: Secondary | ICD-10-CM | POA: Diagnosis not present

## 2017-07-25 DIAGNOSIS — Z4789 Encounter for other orthopedic aftercare: Secondary | ICD-10-CM | POA: Diagnosis not present

## 2017-07-25 DIAGNOSIS — R030 Elevated blood-pressure reading, without diagnosis of hypertension: Secondary | ICD-10-CM | POA: Diagnosis not present

## 2017-07-25 DIAGNOSIS — E119 Type 2 diabetes mellitus without complications: Secondary | ICD-10-CM | POA: Diagnosis not present

## 2017-07-25 DIAGNOSIS — R3981 Functional urinary incontinence: Secondary | ICD-10-CM | POA: Diagnosis not present

## 2017-07-25 DIAGNOSIS — M47816 Spondylosis without myelopathy or radiculopathy, lumbar region: Secondary | ICD-10-CM | POA: Diagnosis not present

## 2017-07-26 DIAGNOSIS — M5440 Lumbago with sciatica, unspecified side: Secondary | ICD-10-CM | POA: Diagnosis not present

## 2017-07-27 ENCOUNTER — Ambulatory Visit (INDEPENDENT_AMBULATORY_CARE_PROVIDER_SITE_OTHER): Payer: Medicare Other

## 2017-07-27 VITALS — BP 138/70 | HR 71 | Temp 98.1°F | Resp 18 | Ht 72.0 in | Wt 191.7 lb

## 2017-07-27 DIAGNOSIS — Z0000 Encounter for general adult medical examination without abnormal findings: Secondary | ICD-10-CM

## 2017-07-27 NOTE — Progress Notes (Signed)
Subjective:   Sean Reyes is a 77 y.o. male who presents for Medicare Annual/Subsequent preventive examination.  Review of Systems:   Cardiac Risk Factors include: hypertension;male gender;advanced age (>38men, >77 women);diabetes mellitus;dyslipidemia     Objective:    Vitals: BP 138/70 (BP Location: Left Arm, Patient Position: Sitting)   Pulse 71   Temp 98.1 F (36.7 C) (Temporal)   Resp 18   Ht 6' (1.829 m)   Wt 191 lb 11.2 oz (87 kg)   SpO2 98%   BMI 26.00 kg/m   Body mass index is 26 kg/m.  Advanced Directives 07/27/2017 06/27/2017 06/20/2017 09/14/2016 09/07/2016 07/26/2016 10/05/2015  Does Patient Have a Medical Advance Directive? Yes Yes Yes Yes Yes Yes Yes  Type of Paramedic of [place];Living will [place];Living will [place];Living will Healthcare Power of [place] of [place];Living will Living will  Does patient want to make changes to medical advance directive? - - - No - Patient declined No - Patient declined - -  Copy of [place] in Chart? No - copy requested Yes Yes No - copy requested No - copy requested No - copy requested Yes  Would patient like information on creating a medical advance directive? - - - - - - -    Tobacco Social History   Tobacco Use  Smoking Status Never Smoker  Smokeless Tobacco Never Used     Counseling given: Not Answered   Clinical Intake:  Pre-visit preparation completed: Yes  Pain : 0-10 Pain Score: 1  Pain Type: Acute pain Pain Location: Back Pain Orientation: Lower Pain Descriptors / Indicators: Aching Pain Onset: More than a month ago Pain Frequency: Constant     Nutritional Status: BMI 25 -29 Overweight Nutritional Risks: None, Unintentional weight loss(no appetite ) Diabetes: Yes CBG done?: No Did pt. bring in CBG monitor from home?: No  How often do you need to have someone help  you when you read instructions, pamphlets, or other written materials from your doctor or pharmacy?: 1 - Never What is the last grade level you completed in school?: high school,  trade school   Interpreter Needed?: No  Information entered by :: Lamar Meter,LPN   Past Medical History:  Diagnosis Date  . Anemia   . Anxiety   . Arthritis   . Atrial fibrillation ([place])   . Complication of anesthesia    pt reports low BP's after surgery at [facility] and difficulty awakening  . Depression   . Diabetes ([place])   . Dysrhythmia   . GERD (gastroesophageal reflux disease)    OCC TAKES ALKA SELTZER  . Hyperlipidemia   . Hypertension   . Nocturia   . S/P ablation of atrial fibrillation    Ablative therapy  . Sleep apnea    CPAP   . Tachycardia, unspecified    Past Surgical History:  Procedure Laterality Date  . ABLATION    . ANTERIOR LAT LUMBAR FUSION N/A 06/27/2017   Procedure: Anterior Lateral Lumbar Interbody  Fusion - Lumbar Two-Lumbar Three - Lumbar Three-Lumbar Four, Posterior Lumbar Interbody Fusion Lumbar Four- Five;  Surgeon: Kary Kos, MD;  Location: [place];  Service: Neurosurgery;  Laterality: N/A;  Anterior Lateral Lumbar Interbody  Fusion - Lumbar Two-Lumbar Three - Lumbar Three-Lumbar Four, Posterior Lumbar Interbody Fusion Lumbar Four- Five  . COLONOSCOPY WITH PROPOFOL N/A 10/05/2015   Procedure: COLONOSCOPY WITH PROPOFOL;  Surgeon: Lollie Sails,  MD;  Location: [facility] ENDOSCOPY;  Service: Endoscopy;  Laterality: N/A;  . HERNIA REPAIR    . JOINT REPLACEMENT Bilateral    hips  RT+  LEFT X2   . LUMBAR LAMINECTOMY/DECOMPRESSION MICRODISCECTOMY Left 09/13/2016   Procedure: Microdiscectomy - Lumbar two-three,  Lumbar three- - left;  Surgeon: Kary Kos, MD;  Location: [place];  Service: Neurosurgery;  Laterality: Left;  . TONSILLECTOMY     Family History  Problem Relation Age of Onset  . Brain cancer Mother   . Kidney disease Neg Hx   . Prostate cancer Neg Hx   . Kidney  cancer Neg Hx   . Bladder Cancer Neg Hx    Social History   Socioeconomic History  . Marital status: Married    Spouse name: Not on file  . Number of children: Not on file  . Years of education: Not on file  . Highest education level: Not on file  Occupational History  . Occupation: full time  Social Needs  . Financial resource strain: Not very hard  . Food insecurity:    Worry: Never true    Inability: Never true  . Transportation needs:    Medical: No    Non-medical: No  Tobacco Use  . Smoking status: Never Smoker  . Smokeless tobacco: Never Used  Substance and Sexual Activity  . Alcohol use: No  . Drug use: No  . Sexual activity: Not on file  Lifestyle  . Physical activity:    Days per week: 0 days    Minutes per session: 0 min  . Stress: Not on file  Relationships  . Social connections:    Talks on phone: More than three times a week    Gets together: More than three times a week    Attends religious service: More than 4 times per year    Active member of club or organization: No    Attends meetings of clubs or organizations: Never    Relationship status: Married  Other Topics Concern  . Not on file  Social History Narrative   Married   Gets regular exercise    Outpatient Encounter Medications as of 07/27/2017  Medication Sig  . benazepril (LOTENSIN) 40 MG tablet Take 1 tablet (40 mg total) by mouth daily.  [name] ELIQUIS 5 MG TABS tablet   . finasteride (PROSCAR) 5 MG tablet Take 1 tablet (5 mg total) by mouth daily. (Patient taking differently: Take 5 mg by mouth at bedtime. )  . metFORMIN (GLUCOPHAGE-XR) 500 MG 24 hr tablet 2 tabs in AM and PM (Patient taking differently: Take 1,000 mg by mouth 2 (two) times daily. )  . metoprolol succinate (TOPROL-XL) 25 MG 24 hr tablet Take 1 tablet (25 mg total) by mouth 2 (two) times daily.  . propafenone (RYTHMOL SR) 425 MG 12 hr capsule Take 425 mg by mouth 2 (two) times daily.  . cyclobenzaprine (FLEXERIL) 10 MG tablet  Take 1 tablet (10 mg total) by mouth 3 (three) times daily as needed for muscle spasms. (Patient not taking: Reported on 07/27/2017)  . HYDROcodone-acetaminophen (NORCO/VICODIN) 5-325 MG tablet Take 1-2 tablets by mouth every 4 (four) hours as needed for moderate pain. (Patient not taking: Reported on 07/27/2017)  . tiZANidine (ZANAFLEX) 4 MG tablet    No facility-administered encounter medications on file as of 07/27/2017.     Activities of Daily Living In your present state of health, do you have any difficulty performing the following activities: 07/27/2017 06/20/2017  Hearing? N  N  Vision? N N  Comment has cataract surgery scheduled -  Difficulty concentrating or making decisions? N N  Walking or climbing stairs? N Y  Dressing or bathing? Y N  Comment due to surgery  -  Doing errands, shopping? N -  Preparing Food and eating ? N -  Using the Toilet? N -  In the past six months, have you accidently leaked urine? N -  Do you have problems with loss of bowel control? N -  Managing your Medications? N -  Managing your Finances? N -  Housekeeping or managing your Housekeeping? N -  Some recent data might be hidden    Patient Care Team: [name], MD as PCP - General (Family Medicine) [name], MD as PCP - Family Medicine (Family Medicine) Corey Skains, MD as Consulting Physician (Internal Medicine) Ralene Bathe, MD (Dermatology) Kary Kos, MD as Consulting Physician (Neurosurgery) Marry Guan, Laurice Record, MD (Orthopedic Surgery) Abbie Sons, MD (Urology)   Assessment:   This is a routine wellness examination for [place].  Exercise Activities and Dietary recommendations Current Exercise Habits: Home exercise routine, Type of exercise: stretching;strength training/weights, Time (Minutes): 15, Frequency (Times/Week): 4, Weekly Exercise (Minutes/Week): 60, Intensity: Mild, Exercise limited by: orthopedic condition(s)(back surgery )  Goals    None      Fall  Risk Fall Risk  07/27/2017 05/02/2017 12/20/2016 08/10/2016 07/26/2016  Falls in the past year? No Yes Yes No No  Number falls in past yr: - 1 1 - -  Injury with Fall? - Yes Yes - -  Risk Factor Category  - - High Fall Risk - -  Follow up - Falls evaluation completed Falls evaluation completed - -   Is the patient's home free of loose throw rugs in walkways, pet beds, electrical cords, etc?   no      Grab bars in the bathroom? yes      Handrails on the stairs?   yes      Adequate lighting?   yes  Timed Get Up and Go Performed: Completed in 9 seconds with use of assistive devices- cane, steady gait. No intervention needed at this time.   Depression Screen PHQ 2/9 Scores 07/27/2017 05/02/2017 12/20/2016 08/10/2016  PHQ - 2 Score 0 0 0 0  PHQ- 9 Score - 0 - -    Cognitive Function     6CIT Screen 07/27/2017 07/26/2016  What Year? 0 points 0 points  What month? 0 points 0 points  What time? 0 points 0 points  Count back from 20 0 points 0 points  Months in reverse 0 points 0 points  Repeat phrase 0 points 0 points  Total Score 0 0    Immunization History  Administered Date(s) Administered  . Influenza, High Dose Seasonal PF 11/16/2015, 12/05/2016  . Influenza,inj,Quad PF,6+ Mos 12/01/2014  . Pneumococcal Conjugate-13 09/03/2013  . Pneumococcal Polysaccharide-23 06/02/2015  . Td 06/02/2015  . Zoster 02/14/2011    Qualifies for Shingles Vaccine? Yes, discussed shingrix vaccine   Screening Tests Health Maintenance  Topic Date Due  . FOOT EXAM  06/19/2017  . INFLUENZA VACCINE  09/13/2017  . OPHTHALMOLOGY EXAM  10/31/2017  . HEMOGLOBIN A1C  11/02/2017  . TETANUS/TDAP  06/01/2025  . COLONOSCOPY  10/04/2025  . PNA vac Low Risk Adult  Completed   Cancer Screenings: Lung: Low Dose CT Chest recommended if Age 86-80 years, 30 pack-year currently smoking OR have quit w/in 15years. Patient does not qualify.  Colorectal: completed 10/05/2015  Additional Screenings:  Hepatitis C  Screening:not indicated       Plan:    I have personally reviewed and addressed the Medicare Annual Wellness questionnaire and have noted the following in the patient's chart:  A. Medical and social history B. Use of alcohol, tobacco or illicit drugs  C. Current medications and supplements D. Functional ability and status E.  Nutritional status F.  Physical activity G. Advance directives H. List of other physicians I.  Hospitalizations, surgeries, and ER visits in previous 12 months J.  [place] such as hearing and vision if needed, cognitive and depression L. Referrals and appointments   In addition, I have reviewed and discussed with patient certain preventive protocols, quality metrics, and best practice recommendations. A written personalized care plan for preventive services as well as general preventive health recommendations were provided to patient.   Signed,  Tyler Aas, LPN Nurse Health Advisor   Nurse Notes: diabetic foot exam due- cpe on 08/22/2017

## 2017-07-27 NOTE — Patient Instructions (Signed)
Sean Reyes , Thank you for taking time to come for your Medicare Wellness Visit. I appreciate your ongoing commitment to your health goals. Please review the following plan we discussed and let me know if I can assist you in the future.   Screening recommendations/referrals: Colonoscopy: completed 10/05/2015 Recommended yearly ophthalmology/optometry visit for glaucoma screening and checkup Recommended yearly dental visit for hygiene and checkup  Vaccinations: Influenza vaccine: up to date Pneumococcal vaccine: up to date Tdap vaccine: up to date Shingles vaccine: shingrix eligible, check with your insurance company for coverage     Advanced directives: Please bring a copy of your health care power of attorney and living will to the office at your convenience.  Conditions/risks identified: recommend drinking at least 6-8 glasses of water a day  Next appointment: Follow up on 08/22/2017 at 10:45am with Dr.Crissman. Follow up in one year for your annual wellness exam.   Preventive Care 65 Years and Older, Male Preventive care refers to lifestyle choices and visits with your health care provider that can promote health and wellness. What does preventive care include?  A yearly physical exam. This is also called an annual well check.  Dental exams once or twice a year.  Routine eye exams. Ask your health care provider how often you should have your eyes checked.  Personal lifestyle choices, including:  Daily care of your teeth and gums.  Regular physical activity.  Eating a healthy diet.  Avoiding tobacco and drug use.  Limiting alcohol use.  Practicing safe sex.  Taking low doses of aspirin every day.  Taking vitamin and mineral supplements as recommended by your health care provider. What happens during an annual well check? The services and screenings done by your health care provider during your annual well check will depend on your age, overall health, lifestyle risk  factors, and family history of disease. Counseling  Your health care provider may ask you questions about your:  Alcohol use.  Tobacco use.  Drug use.  Emotional well-being.  Home and relationship well-being.  Sexual activity.  Eating habits.  History of falls.  Memory and ability to understand (cognition).  Work and work Statistician. Screening  You may have the following tests or measurements:  Height, weight, and BMI.  Blood pressure.  Lipid and cholesterol levels. These may be checked every 5 years, or more frequently if you are over 43 years old.  Skin check.  Lung cancer screening. You may have this screening every year starting at age 19 if you have a 30-pack-year history of smoking and currently smoke or have quit within the past 15 years.  Fecal occult blood test (FOBT) of the stool. You may have this test every year starting at age 45.  Flexible sigmoidoscopy or colonoscopy. You may have a sigmoidoscopy every 5 years or a colonoscopy every 10 years starting at age 46.  Prostate cancer screening. Recommendations will vary depending on your family history and other risks.  Hepatitis C blood test.  Hepatitis B blood test.  Sexually transmitted disease (STD) testing.  Diabetes screening. This is done by checking your blood sugar (glucose) after you have not eaten for a while (fasting). You may have this done every 1-3 years.  Abdominal aortic aneurysm (AAA) screening. You may need this if you are a current or former smoker.  Osteoporosis. You may be screened starting at age 74 if you are at high risk. Talk with your health care provider about your test results, treatment options, and if  necessary, the need for more tests. Vaccines  Your health care provider may recommend certain vaccines, such as:  Influenza vaccine. This is recommended every year.  Tetanus, diphtheria, and acellular pertussis (Tdap, Td) vaccine. You may need a Td booster every 10  years.  Zoster vaccine. You may need this after age 20.  Pneumococcal 13-valent conjugate (PCV13) vaccine. One dose is recommended after age 62.  Pneumococcal polysaccharide (PPSV23) vaccine. One dose is recommended after age 70. Talk to your health care provider about which screenings and vaccines you need and how often you need them. This information is not intended to replace advice given to you by your health care provider. Make sure you discuss any questions you have with your health care provider. Document Released: 02/26/2015 Document Revised: 10/20/2015 Document Reviewed: 12/01/2014 Elsevier Interactive Patient Education  2017 [place] Prevention in the Home Falls can cause injuries. They can happen to people of all ages. There are many things you can do to make your home safe and to help prevent falls. What can I do on the outside of my home?  Regularly fix the edges of walkways and driveways and fix any cracks.  Remove anything that might make you trip as you walk through a door, such as a raised step or threshold.  Trim any bushes or trees on the path to your home.  Use bright outdoor lighting.  Clear any walking paths of anything that might make someone trip, such as rocks or tools.  Regularly check to see if handrails are loose or broken. Make sure that both sides of any steps have handrails.  Any raised decks and porches should have guardrails on the edges.  Have any leaves, snow, or ice cleared regularly.  Use sand or salt on walking paths during winter.  Clean up any spills in your garage right away. This includes oil or grease spills. What can I do in the bathroom?  Use night lights.  Install grab bars by the toilet and in the tub and shower. Do not use towel bars as grab bars.  Use non-skid mats or decals in the tub or shower.  If you need to sit down in the shower, use a plastic, non-slip stool.  Keep the floor dry. Clean up any water that  spills on the floor as soon as it happens.  Remove soap buildup in the tub or shower regularly.  Attach bath mats securely with double-sided non-slip rug tape.  Do not have throw rugs and other things on the floor that can make you trip. What can I do in the bedroom?  Use night lights.  Make sure that you have a light by your bed that is easy to reach.  Do not use any sheets or blankets that are too big for your bed. They should not hang down onto the floor.  Have a firm chair that has side arms. You can use this for support while you get dressed.  Do not have throw rugs and other things on the floor that can make you trip. What can I do in the kitchen?  Clean up any spills right away.  Avoid walking on wet floors.  Keep items that you use a lot in easy-to-reach places.  If you need to reach something above you, use a strong step stool that has a grab bar.  Keep electrical cords out of the way.  Do not use floor polish or wax that makes floors slippery. If you must  use wax, use non-skid floor wax.  Do not have throw rugs and other things on the floor that can make you trip. What can I do with my stairs?  Do not leave any items on the stairs.  Make sure that there are handrails on both sides of the stairs and use them. Fix handrails that are broken or loose. Make sure that handrails are as long as the stairways.  Check any carpeting to make sure that it is firmly attached to the stairs. Fix any carpet that is loose or worn.  Avoid having throw rugs at the top or bottom of the stairs. If you do have throw rugs, attach them to the floor with carpet tape.  Make sure that you have a light switch at the top of the stairs and the bottom of the stairs. If you do not have them, ask someone to add them for you. What else can I do to help prevent falls?  Wear shoes that:  Do not have high heels.  Have rubber bottoms.  Are comfortable and fit you well.  Are closed at the  toe. Do not wear sandals.  If you use a stepladder:  Make sure that it is fully opened. Do not climb a closed stepladder.  Make sure that both sides of the stepladder are locked into place.  Ask someone to hold it for you, if possible.  Clearly mark and make sure that you can see:  Any grab bars or handrails.  First and last steps.  Where the edge of each step is.  Use tools that help you move around (mobility aids) if they are needed. These include:  Canes.  Walkers.  Scooters.  Crutches.  Turn on the lights when you go into a dark area. Replace any light bulbs as soon as they burn out.  Set up your furniture so you have a clear path. Avoid moving your furniture around.  If any of your floors are uneven, fix them.  If there are any pets around you, be aware of where they are.  Review your medicines with your doctor. Some medicines can make you feel dizzy. This can increase your chance of falling. Ask your doctor what other things that you can do to help prevent falls. This information is not intended to replace advice given to you by your health care provider. Make sure you discuss any questions you have with your health care provider. Document Released: 11/26/2008 Document Revised: 07/08/2015 Document Reviewed: 03/06/2014 Elsevier Interactive Patient Education  2017 [employer].

## 2017-08-02 ENCOUNTER — Ambulatory Visit: Payer: Medicare Other | Admitting: Family Medicine

## 2017-08-08 ENCOUNTER — Other Ambulatory Visit: Payer: Self-pay | Admitting: Family Medicine

## 2017-08-10 NOTE — Telephone Encounter (Signed)
It looks like this was stopped when he left the hospital- can we please check to see if they wanted him to restart it?

## 2017-08-10 NOTE — Telephone Encounter (Signed)
Eliquis refill Last Refill:no prescriber listed 5 21/19 # not listed5 RF Last OV: 05/02/17 PCP: Dr Jeananne Rama Pharmacy:[facility]

## 2017-08-14 ENCOUNTER — Ambulatory Visit: Payer: Medicare Other | Admitting: Family Medicine

## 2017-08-14 DIAGNOSIS — R350 Frequency of micturition: Secondary | ICD-10-CM | POA: Diagnosis not present

## 2017-08-22 ENCOUNTER — Ambulatory Visit (INDEPENDENT_AMBULATORY_CARE_PROVIDER_SITE_OTHER): Payer: Medicare Other | Admitting: Family Medicine

## 2017-08-22 ENCOUNTER — Encounter: Payer: Self-pay | Admitting: Family Medicine

## 2017-08-22 VITALS — BP 159/83 | HR 68 | Ht 72.0 in | Wt 191.0 lb

## 2017-08-22 DIAGNOSIS — N183 Chronic kidney disease, stage 3 unspecified: Secondary | ICD-10-CM

## 2017-08-22 DIAGNOSIS — I480 Paroxysmal atrial fibrillation: Secondary | ICD-10-CM

## 2017-08-22 DIAGNOSIS — N138 Other obstructive and reflux uropathy: Secondary | ICD-10-CM | POA: Diagnosis not present

## 2017-08-22 DIAGNOSIS — Z7901 Long term (current) use of anticoagulants: Secondary | ICD-10-CM

## 2017-08-22 DIAGNOSIS — E1143 Type 2 diabetes mellitus with diabetic autonomic (poly)neuropathy: Secondary | ICD-10-CM | POA: Diagnosis not present

## 2017-08-22 DIAGNOSIS — N401 Enlarged prostate with lower urinary tract symptoms: Secondary | ICD-10-CM | POA: Diagnosis not present

## 2017-08-22 DIAGNOSIS — I129 Hypertensive chronic kidney disease with stage 1 through stage 4 chronic kidney disease, or unspecified chronic kidney disease: Secondary | ICD-10-CM | POA: Diagnosis not present

## 2017-08-22 LAB — BAYER DCA HB A1C WAIVED: HB A1C (BAYER DCA - WAIVED): 6.3 % (ref <7.0)

## 2017-08-22 NOTE — Assessment & Plan Note (Signed)
The current medical regimen is effective;  continue present plan and medications.  

## 2017-08-22 NOTE — Progress Notes (Signed)
   BP (!) 159/83   Pulse 68   Ht 6' (1.829 m)   Wt 191 lb (86.6 kg)   SpO2 98%   BMI 25.90 kg/m    Subjective:    Patient ID: Sean Reyes, male    DOB: [birth year], 77 y.o.   MRN: [id number]  HPI: Keiffer Piper is a 77 y.o. male  Chief Complaint  Patient presents with  . Follow-up  . Diabetes   Patient follow-up doing well with diabetes no low blood sugar spells or issues. Apparently good control with glucose no problems with surgery which was monitored and postop is done well also. Back surgery is recovering well still has brace and crutch but otherwise doing well. Still having a great deal of prostate issues is getting ready for Botox injections. Relevant past medical, surgical, family and social history reviewed and updated as indicated. Interim medical history since our last visit reviewed. Allergies and medications reviewed and updated.  Review of Systems  Constitutional: Negative.   Respiratory: Negative.   Cardiovascular: Negative.     Per HPI unless specifically indicated above     Objective:    BP (!) 159/83   Pulse 68   Ht 6' (1.829 m)   Wt 191 lb (86.6 kg)   SpO2 98%   BMI 25.90 kg/m   Wt Readings from Last 3 Encounters:  08/22/17 191 lb (86.6 kg)  07/27/17 191 lb 11.2 oz (87 kg)  07/17/17 201 lb (91.2 kg)    Physical Exam  Constitutional: He is oriented to person, place, and time. He appears well-developed and well-nourished.  HENT:  Head: Normocephalic and atraumatic.  Eyes: Conjunctivae and EOM are normal.  Neck: Normal range of motion.  Cardiovascular: Normal rate, regular rhythm and normal heart sounds.  Pulmonary/Chest: Effort normal and breath sounds normal.  Musculoskeletal: Normal range of motion.  Neurological: He is alert and oriented to person, place, and time.  Skin: No erythema.  Psychiatric: He has a normal mood and affect. His behavior is normal. Judgment and thought content normal.    Results for orders placed or performed in  visit on 07/17/17  BLADDER SCAN AMB NON-IMAGING  Result Value Ref Range   Scan Result 14ml       Assessment & Plan:   Problem List Items Addressed This Visit      Cardiovascular and Mediastinum   Paroxysmal atrial fibrillation ([place])    The current medical regimen is effective;  continue present plan and medications.         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       BPH with obstruction/lower urinary tract symptoms    The current medical regimen is effective;  continue present plan and medications.         Other   Chronic anticoagulation    The current medical regimen is effective;  continue present plan and medications.           Follow up plan: Return in about 1 month (around 09/19/2017) for Physical Exam.

## 2017-09-10 ENCOUNTER — Other Ambulatory Visit: Payer: Self-pay | Admitting: Family Medicine

## 2017-09-11 ENCOUNTER — Telehealth: Payer: Self-pay

## 2017-09-11 DIAGNOSIS — M5440 Lumbago with sciatica, unspecified side: Secondary | ICD-10-CM | POA: Diagnosis not present

## 2017-09-11 DIAGNOSIS — M419 Scoliosis, unspecified: Secondary | ICD-10-CM | POA: Diagnosis not present

## 2017-09-11 NOTE — Telephone Encounter (Signed)
Received fax from [facility] Dr. Isac Caddy from [facility] ([address], [place] [address] Tele: [phone] Fax: [phone]) that states, "Sean Reyes needs okay to come off of Eliquis for 3 days to get Botox injection in Bladder"  Please advise.   Original scanned into box.

## 2017-09-11 NOTE — Telephone Encounter (Signed)
Eliquis 5 mg refill request  LOV 08/22/17 with Dr. Jeananne Rama  LR:  07/03/17 with 5 refills but does not show where it was dispensed.  A Dr. John Giovanni prescribed this.  [place], [place]

## 2017-09-13 NOTE — Telephone Encounter (Signed)
I will sign form if we still have it.

## 2017-09-14 NOTE — Telephone Encounter (Signed)
Sean Reyes returned my call and took a verbal okay for D/C eliquis x 3 days for bladder injection

## 2017-09-14 NOTE — Telephone Encounter (Signed)
It was not a form, but just a letter. [facility] Dr. Pia Mau and left vm to return call to find out if verbal is okay, if they need a letter or what. Will await their response

## 2017-10-04 ENCOUNTER — Other Ambulatory Visit: Payer: Self-pay | Admitting: Family Medicine

## 2017-10-04 DIAGNOSIS — Z0000 Encounter for general adult medical examination without abnormal findings: Secondary | ICD-10-CM

## 2017-10-04 DIAGNOSIS — N401 Enlarged prostate with lower urinary tract symptoms: Secondary | ICD-10-CM

## 2017-10-04 DIAGNOSIS — M5136 Other intervertebral disc degeneration, lumbar region: Secondary | ICD-10-CM

## 2017-10-04 DIAGNOSIS — I10 Essential (primary) hypertension: Secondary | ICD-10-CM

## 2017-10-04 DIAGNOSIS — N138 Other obstructive and reflux uropathy: Secondary | ICD-10-CM

## 2017-10-04 DIAGNOSIS — E119 Type 2 diabetes mellitus without complications: Secondary | ICD-10-CM

## 2017-10-04 DIAGNOSIS — Z23 Encounter for immunization: Secondary | ICD-10-CM

## 2017-10-04 DIAGNOSIS — I4891 Unspecified atrial fibrillation: Secondary | ICD-10-CM

## 2017-10-10 DIAGNOSIS — G4733 Obstructive sleep apnea (adult) (pediatric): Secondary | ICD-10-CM | POA: Diagnosis not present

## 2017-10-10 DIAGNOSIS — I480 Paroxysmal atrial fibrillation: Secondary | ICD-10-CM | POA: Diagnosis not present

## 2017-10-10 DIAGNOSIS — I10 Essential (primary) hypertension: Secondary | ICD-10-CM | POA: Diagnosis not present

## 2017-10-10 DIAGNOSIS — E782 Mixed hyperlipidemia: Secondary | ICD-10-CM | POA: Diagnosis not present

## 2017-11-05 DIAGNOSIS — N3281 Overactive bladder: Secondary | ICD-10-CM | POA: Diagnosis not present

## 2017-11-08 ENCOUNTER — Other Ambulatory Visit: Payer: Self-pay | Admitting: Family Medicine

## 2017-11-08 DIAGNOSIS — I10 Essential (primary) hypertension: Secondary | ICD-10-CM

## 2017-11-08 DIAGNOSIS — N138 Other obstructive and reflux uropathy: Secondary | ICD-10-CM

## 2017-11-08 DIAGNOSIS — E119 Type 2 diabetes mellitus without complications: Secondary | ICD-10-CM

## 2017-11-08 DIAGNOSIS — M5136 Other intervertebral disc degeneration, lumbar region: Secondary | ICD-10-CM

## 2017-11-08 DIAGNOSIS — I4891 Unspecified atrial fibrillation: Secondary | ICD-10-CM

## 2017-11-08 DIAGNOSIS — Z0000 Encounter for general adult medical examination without abnormal findings: Secondary | ICD-10-CM

## 2017-11-08 DIAGNOSIS — Z23 Encounter for immunization: Secondary | ICD-10-CM | POA: Diagnosis not present

## 2017-11-08 DIAGNOSIS — N401 Enlarged prostate with lower urinary tract symptoms: Secondary | ICD-10-CM

## 2017-11-12 ENCOUNTER — Telehealth: Payer: Self-pay | Admitting: Family Medicine

## 2017-11-12 DIAGNOSIS — R350 Frequency of micturition: Secondary | ICD-10-CM | POA: Diagnosis not present

## 2017-11-12 DIAGNOSIS — N3281 Overactive bladder: Secondary | ICD-10-CM | POA: Diagnosis not present

## 2017-11-12 NOTE — Telephone Encounter (Signed)
Patient was started on sulfamethoxazole by Dr Algie Coffer urologist with [facility] and was told he should let Dr Jeananne Rama know since this could interfere with the  [place].  Please advise.  Thank  You  [phone] Theodoro Doing

## 2017-11-13 DIAGNOSIS — M419 Scoliosis, unspecified: Secondary | ICD-10-CM | POA: Diagnosis not present

## 2017-11-13 DIAGNOSIS — M4807 Spinal stenosis, lumbosacral region: Secondary | ICD-10-CM | POA: Diagnosis not present

## 2017-11-22 ENCOUNTER — Other Ambulatory Visit: Payer: Self-pay | Admitting: Family Medicine

## 2017-11-22 DIAGNOSIS — E119 Type 2 diabetes mellitus without complications: Secondary | ICD-10-CM

## 2017-11-22 NOTE — Telephone Encounter (Signed)
Requested medication (s) are due for refill today: yes  Requested medication (s) are on the active medication list: yes  Last refill:  09/28/16  #120  12 refills  Future visit scheduled: yes  Notes to clinic:  RX expired    Requested Prescriptions  Pending Prescriptions Disp Refills   metFORMIN (GLUCOPHAGE-XR) 500 MG 24 hr tablet [Pharmacy Med Name: METFORMIN HCL ER 500 MG TABLET] 120 tablet 0    Sig: 2 tabs in AM and PM     Endocrinology:  Diabetes - Biguanides Passed - 11/22/2017  4:14 PM      Passed - Cr in normal range and within 360 days    Creatinine  Date Value Ref Range Status  07/01/2012 1.27 0.60 - 1.30 mg/dL Final   Creatinine, Ser  Date Value Ref Range Status  06/29/2017 1.23 0.61 - 1.24 mg/dL Final         Passed - HBA1C is between 0 and 7.9 and within 180 days    Hgb A1c MFr Bld  Date Value Ref Range Status  09/07/2016 6.5 (H) 4.8 - 5.6 % Final    Comment:    (NOTE)         Pre-diabetes: 5.7 - 6.4         Diabetes: >6.4         Glycemic control for adults with diabetes: <7.0          Passed - eGFR in normal range and within 360 days    EGFR (African American)  Date Value Ref Range Status  07/01/2012 >60  Final   GFR calc Af Amer  Date Value Ref Range Status  06/29/2017 >60 >60 mL/min Final    Comment:    (NOTE) The eGFR has been calculated using the CKD EPI equation. This calculation has not been validated in all clinical situations. eGFR's persistently <60 mL/min signify possible Chronic Kidney Disease.    EGFR (Non-African Amer.)  Date Value Ref Range Status  07/01/2012 57 (L)  Final    Comment:    eGFR values <54m/min/1.73 m2 may be an indication of chronic kidney disease (CKD). Calculated eGFR is useful in patients with stable renal function. The eGFR calculation will not be reliable in acutely ill patients when serum creatinine is changing rapidly. It is not useful in  patients on dialysis. The eGFR calculation may not be  applicable to patients at the low and high extremes of body sizes, pregnant women, and vegetarians.    GFR calc non Af Amer  Date Value Ref Range Status  06/29/2017 56 (L) >60 mL/min Final         Passed - Valid encounter within last 6 months    Recent Outpatient Visits          3 months ago Type 2 diabetes mellitus with diabetic autonomic neuropathy, without long-term current use of insulin ([place]   [facility] How [facility]   6 months ago Diabetes mellitus without complication ([place]   [place] [facility] How [place]   9 months ago Paroxysmal atrial fibrillation ([facility]   [facility] [place] [facility] [place]   10 months ago Atrial fibrillation, unspecified type ([facility]   [place] How [place]   11 months ago Atrial fibrillation, unspecified type ([facility]     Future Appointments            In 6 days Crissman,  Jeannette How, MD [facility]   In 4 months MacDiarmid, Nicki Reaper, MD [place]   In 8 months  [facility], [facility]

## 2017-11-28 ENCOUNTER — Ambulatory Visit: Payer: Medicare Other | Admitting: Family Medicine

## 2017-12-04 DIAGNOSIS — M5416 Radiculopathy, lumbar region: Secondary | ICD-10-CM | POA: Diagnosis not present

## 2017-12-05 ENCOUNTER — Other Ambulatory Visit: Payer: Self-pay | Admitting: Neurosurgery

## 2017-12-05 DIAGNOSIS — M5416 Radiculopathy, lumbar region: Secondary | ICD-10-CM

## 2017-12-11 ENCOUNTER — Other Ambulatory Visit: Payer: Self-pay | Admitting: Family Medicine

## 2017-12-12 DIAGNOSIS — N3281 Overactive bladder: Secondary | ICD-10-CM | POA: Diagnosis not present

## 2017-12-20 ENCOUNTER — Ambulatory Visit: Payer: Medicare Other | Admitting: Family Medicine

## 2017-12-20 ENCOUNTER — Encounter: Payer: Self-pay | Admitting: Family Medicine

## 2017-12-20 ENCOUNTER — Ambulatory Visit (INDEPENDENT_AMBULATORY_CARE_PROVIDER_SITE_OTHER): Payer: Medicare Other | Admitting: Family Medicine

## 2017-12-20 DIAGNOSIS — N138 Other obstructive and reflux uropathy: Secondary | ICD-10-CM

## 2017-12-20 DIAGNOSIS — E119 Type 2 diabetes mellitus without complications: Secondary | ICD-10-CM | POA: Diagnosis not present

## 2017-12-20 DIAGNOSIS — Z23 Encounter for immunization: Secondary | ICD-10-CM | POA: Diagnosis not present

## 2017-12-20 DIAGNOSIS — N401 Enlarged prostate with lower urinary tract symptoms: Secondary | ICD-10-CM | POA: Diagnosis not present

## 2017-12-20 DIAGNOSIS — E1143 Type 2 diabetes mellitus with diabetic autonomic (poly)neuropathy: Secondary | ICD-10-CM | POA: Diagnosis not present

## 2017-12-20 DIAGNOSIS — N183 Chronic kidney disease, stage 3 unspecified: Secondary | ICD-10-CM

## 2017-12-20 DIAGNOSIS — I4891 Unspecified atrial fibrillation: Secondary | ICD-10-CM | POA: Diagnosis not present

## 2017-12-20 DIAGNOSIS — Z0000 Encounter for general adult medical examination without abnormal findings: Secondary | ICD-10-CM | POA: Diagnosis not present

## 2017-12-20 DIAGNOSIS — G4733 Obstructive sleep apnea (adult) (pediatric): Secondary | ICD-10-CM

## 2017-12-20 DIAGNOSIS — M5136 Other intervertebral disc degeneration, lumbar region: Secondary | ICD-10-CM

## 2017-12-20 DIAGNOSIS — I480 Paroxysmal atrial fibrillation: Secondary | ICD-10-CM

## 2017-12-20 DIAGNOSIS — I129 Hypertensive chronic kidney disease with stage 1 through stage 4 chronic kidney disease, or unspecified chronic kidney disease: Secondary | ICD-10-CM | POA: Diagnosis not present

## 2017-12-20 DIAGNOSIS — I10 Essential (primary) hypertension: Secondary | ICD-10-CM | POA: Diagnosis not present

## 2017-12-20 LAB — URINALYSIS, ROUTINE W REFLEX MICROSCOPIC
Bilirubin, UA: NEGATIVE
Ketones, UA: NEGATIVE
Leukocytes, UA: NEGATIVE
Nitrite, UA: NEGATIVE
Protein, UA: NEGATIVE
RBC, UA: NEGATIVE
Specific Gravity, UA: 1.02 (ref 1.005–1.030)
Urobilinogen, Ur: 0.2 mg/dL (ref 0.2–1.0)
pH, UA: 5.5 (ref 5.0–7.5)

## 2017-12-20 LAB — BAYER DCA HB A1C WAIVED: HB A1C (BAYER DCA - WAIVED): 6.9 % (ref <7.0)

## 2017-12-20 MED ORDER — BENAZEPRIL HCL 40 MG PO TABS: ORAL_TABLET | ORAL | 12 refills | Status: DC

## 2017-12-20 MED ORDER — AMLODIPINE BESYLATE 5 MG PO TABS
5.0000 mg | ORAL_TABLET | Freq: Every day | ORAL | 12 refills | Status: DC
Start: 2017-12-20 — End: 2019-02-05

## 2017-12-20 MED ORDER — METFORMIN HCL ER 500 MG PO TB24: 1000.0000 mg | ORAL_TABLET | Freq: Two times a day (BID) | ORAL | 12 refills | Status: DC

## 2017-12-20 MED ORDER — APIXABAN 5 MG PO TABS: 5.0000 mg | ORAL_TABLET | Freq: Two times a day (BID) | ORAL | 12 refills | Status: DC

## 2017-12-20 MED ORDER — METOPROLOL SUCCINATE ER 25 MG PO TB24: 25.0000 mg | ORAL_TABLET | Freq: Two times a day (BID) | ORAL | 12 refills | Status: DC

## 2017-12-20 NOTE — Assessment & Plan Note (Signed)
Followed by neurosurgery looking at another surgery

## 2017-12-20 NOTE — Assessment & Plan Note (Signed)
The current medical regimen is effective;  continue present plan and medications.  

## 2017-12-20 NOTE — Assessment & Plan Note (Signed)
Stable not using CPAP

## 2017-12-20 NOTE — Progress Notes (Signed)
BP 124/77 (BP Location: Left Arm, Patient Position: Sitting, Cuff Size: Normal)   Pulse 65   Temp 98 F (36.7 C)   Ht 6' (1.829 m)   Wt 193 lb 8 oz (87.8 kg)   SpO2 99%   BMI 26.24 kg/m    Subjective:    Patient ID: Sean Reyes, male    DOB: Aug 07, 1940, 77 y.o.   MRN: [id number]  HPI: Sean Reyes is a 77 y.o. male  Chief Complaint  Patient presents with  . Annual Exam  Patient with worsening back symptoms has been through neurosurgery has MRI scheduled next week. Atrial fibrillation stable taking blood thinners without problems. Stated she is followed by urology in [place] struggling with nocturia. Sleep apnea doing well without mask and no daytime drowsiness are reported snoring or apneic spells. Diabetes doing well no low blood sugar spells or issues. Blood pressure also doing well without problems or concerns.  Relevant past medical, surgical, family and social history reviewed and updated as indicated. Interim medical history since our last visit reviewed. Allergies and medications reviewed and updated.  Review of Systems  Constitutional: Negative.   HENT: Negative.   Eyes: Negative.   Respiratory: Negative.   Cardiovascular: Negative.   Gastrointestinal: Negative.   Endocrine: Negative.   Genitourinary: Negative.   Musculoskeletal: Negative.   Skin: Negative.   Allergic/Immunologic: Negative.   Neurological: Negative.   Hematological: Negative.   Psychiatric/Behavioral: Negative.     Per HPI unless specifically indicated above     Objective:    BP 124/77 (BP Location: Left Arm, Patient Position: Sitting, Cuff Size: Normal)   Pulse 65   Temp 98 F (36.7 C)   Ht 6' (1.829 m)   Wt 193 lb 8 oz (87.8 kg)   SpO2 99%   BMI 26.24 kg/m   Wt Readings from Last 3 Encounters:  12/20/17 193 lb 8 oz (87.8 kg)  08/22/17 191 lb (86.6 kg)  07/27/17 191 lb 11.2 oz (87 kg)    Physical Exam  Constitutional: He is oriented to person, place, and time. He  appears well-developed and well-nourished.  HENT:  Head: Normocephalic.  Right Ear: External ear normal.  Left Ear: External ear normal.  Nose: Nose normal.  Eyes: Pupils are equal, round, and reactive to light. Conjunctivae and EOM are normal.  Neck: Normal range of motion. Neck supple. No thyromegaly present.  Cardiovascular: Normal rate, regular rhythm, normal heart sounds and intact distal pulses.  Pulmonary/Chest: Effort normal and breath sounds normal.  Abdominal: Soft. Bowel sounds are normal. There is no splenomegaly or hepatomegaly.  Genitourinary:  Genitourinary Comments: Followed by urology  Musculoskeletal: Normal range of motion.  Lymphadenopathy:    He has no cervical adenopathy.  Neurological: He is alert and oriented to person, place, and time. He has normal reflexes.  Skin: Skin is warm and dry.  Psychiatric: He has a normal mood and affect. His behavior is normal. Judgment and thought content normal.    Results for orders placed or performed in visit on 08/22/17  Bayer DCA Hb A1c Waived  Result Value Ref Range   HB A1C (BAYER DCA - WAIVED) 6.3 <7.0 %      Assessment & Plan:   Problem List Items Addressed This Visit      Cardiovascular and Mediastinum   Atrial fibrillation (HCC)   Relevant Medications   apixaban (ELIQUIS) 5 MG TABS tablet   benazepril (LOTENSIN) 40 MG tablet   metoprolol succinate (TOPROL-XL) 25 MG 24  hr tablet   amLODipine (NORVASC) 5 MG tablet   Other Relevant Orders   TSH   Comprehensive metabolic panel   Paroxysmal atrial fibrillation (HCC)    The current medical regimen is effective;  continue present plan and medications.       Relevant Medications   apixaban (ELIQUIS) 5 MG TABS tablet   benazepril (LOTENSIN) 40 MG tablet   metoprolol succinate (TOPROL-XL) 25 MG 24 hr tablet   amLODipine (NORVASC) 5 MG tablet     Respiratory   OSA (obstructive sleep apnea)    Stable not using CPAP        Endocrine   Diabetes mellitus  with autonomic neuropathy (HCC)    The current medical regimen is effective;  continue present plan and medications.       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       BPH with obstruction/lower urinary tract symptoms   Relevant Medications   metoprolol succinate (TOPROL-XL) 25 MG 24 hr tablet    Other Visit Diagnoses    Diabetes mellitus without complication (HCC)       Relevant Medications   benazepril (LOTENSIN) 40 MG tablet   metFORMIN (GLUCOPHAGE-XR) 500 MG 24 hr tablet   metoprolol succinate (TOPROL-XL) 25 MG 24 hr tablet   Other Relevant Orders   Urinalysis, Routine w reflex microscopic   CBC with Differential/Platelet   TSH   Lipid panel   Comprehensive metabolic panel   Essential hypertension       Relevant Medications   apixaban (ELIQUIS) 5 MG TABS tablet   benazepril (LOTENSIN) 40 MG tablet   metoprolol succinate (TOPROL-XL) 25 MG 24 hr tablet   amLODipine (NORVASC) 5 MG tablet   Other Relevant Orders   Urinalysis, Routine w reflex microscopic   CBC with Differential/Platelet   TSH   Lipid panel   Comprehensive metabolic panel   Health care maintenance       Relevant Medications   metoprolol succinate (TOPROL-XL) 25 MG 24 hr tablet   Immunization due       Relevant Medications   metoprolol succinate (TOPROL-XL) 25 MG 24 hr tablet   PE (physical exam), annual       Relevant Medications   metoprolol succinate (TOPROL-XL)  25 MG 24 hr tablet       Follow up plan: Return in about 6 months (around 06/20/2018) for BMP, Hemoglobin A1c, CBC.

## 2017-12-21 LAB — LIPID PANEL
Chol/HDL Ratio: 3.6 ratio (ref 0.0–5.0)
Cholesterol, Total: 156 mg/dL (ref 100–199)
HDL: 43 mg/dL (ref >39)
LDL Calculated: 78 mg/dL (ref 0–99)
Triglycerides: 175 mg/dL — ABNORMAL HIGH (ref 0–149)
VLDL Cholesterol Cal: 35 mg/dL (ref 5–40)

## 2017-12-21 LAB — COMPREHENSIVE METABOLIC PANEL
ALT: 15 IU/L (ref 0–44)
AST: 17 IU/L (ref 0–40)
Albumin/Globulin Ratio: 1.6 (ref 1.2–2.2)
Albumin: 4.1 g/dL (ref 3.5–4.8)
Alkaline Phosphatase: 68 IU/L (ref 39–117)
BUN/Creatinine Ratio: 14 (ref 10–24)
BUN: 16 mg/dL (ref 8–27)
Bilirubin Total: 0.4 mg/dL (ref 0.0–1.2)
CO2: 25 mmol/L (ref 20–29)
Calcium: 9.6 mg/dL (ref 8.6–10.2)
Chloride: 97 mmol/L (ref 96–106)
Creatinine, Ser: 1.17 mg/dL (ref 0.76–1.27)
GFR calc Af Amer: 70 mL/min/{1.73_m2} (ref >59)
GFR calc non Af Amer: 61 mL/min/{1.73_m2} (ref >59)
Globulin, Total: 2.5 g/dL (ref 1.5–4.5)
Glucose: 172 mg/dL — ABNORMAL HIGH (ref 65–99)
Potassium: 3.7 mmol/L (ref 3.5–5.2)
Sodium: 136 mmol/L (ref 134–144)
Total Protein: 6.6 g/dL (ref 6.0–8.5)

## 2017-12-21 LAB — CBC WITH DIFFERENTIAL/PLATELET
Basophils Absolute: 0 10*3/uL (ref 0.0–0.2)
Basos: 0 %
EOS (ABSOLUTE): 0.1 10*3/uL (ref 0.0–0.4)
Eos: 1 %
Hematocrit: 35.7 % — ABNORMAL LOW (ref 37.5–51.0)
Hemoglobin: 12.1 g/dL — ABNORMAL LOW (ref 13.0–17.7)
Immature Grans (Abs): 0 10*3/uL (ref 0.0–0.1)
Immature Granulocytes: 0 %
Lymphocytes Absolute: 1.9 10*3/uL (ref 0.7–3.1)
Lymphs: 22 %
MCH: 30.4 pg (ref 26.6–33.0)
MCHC: 33.9 g/dL (ref 31.5–35.7)
MCV: 90 fL (ref 79–97)
Monocytes Absolute: 0.7 10*3/uL (ref 0.1–0.9)
Monocytes: 9 %
Neutrophils Absolute: 5.7 10*3/uL (ref 1.4–7.0)
Neutrophils: 68 %
Platelets: 187 10*3/uL (ref 150–450)
RBC: 3.98 x10E6/uL — ABNORMAL LOW (ref 4.14–5.80)
RDW: 14.4 % (ref 12.3–15.4)
WBC: 8.4 10*3/uL (ref 3.4–10.8)

## 2017-12-21 LAB — TSH: TSH: 3.51 u[IU]/mL (ref 0.450–4.500)

## 2017-12-24 ENCOUNTER — Other Ambulatory Visit: Payer: Self-pay | Admitting: Urology

## 2017-12-25 ENCOUNTER — Ambulatory Visit
Admission: RE | Admit: 2017-12-25 | Discharge: 2017-12-25 | Disposition: A | Discharge status: Home / Self Care | Payer: Medicare Other | Source: Ambulatory Visit | Attending: Neurosurgery | Admitting: Neurosurgery

## 2017-12-25 ENCOUNTER — Encounter: Payer: Self-pay | Admitting: Family Medicine

## 2017-12-25 DIAGNOSIS — M48061 Spinal stenosis, lumbar region without neurogenic claudication: Secondary | ICD-10-CM | POA: Diagnosis not present

## 2017-12-25 DIAGNOSIS — M5126 Other intervertebral disc displacement, lumbar region: Secondary | ICD-10-CM | POA: Diagnosis not present

## 2017-12-25 DIAGNOSIS — M5416 Radiculopathy, lumbar region: Secondary | ICD-10-CM | POA: Diagnosis not present

## 2017-12-25 DIAGNOSIS — M5127 Other intervertebral disc displacement, lumbosacral region: Secondary | ICD-10-CM | POA: Insufficient documentation

## 2017-12-25 MED ORDER — GADOBUTROL 1 MMOL/ML IV SOLN
9.0000 mL | Freq: Once | INTRAVENOUS | Status: AC | PRN
  Administered 2017-12-25: 9 mL via INTRAVENOUS

## 2018-01-03 ENCOUNTER — Telehealth: Payer: Self-pay | Admitting: Family Medicine

## 2018-01-03 NOTE — Telephone Encounter (Signed)
Routing to provider  

## 2018-01-03 NOTE — Telephone Encounter (Signed)
Copied from [place] [id number]. Topic: Quick Communication - Lab Results (Clinic Use ONLY) >> Jan 03, 2018 10:06 AM Scherrie Gerlach wrote: Pt states he has reviewed his lab results on line and has some concerns.  Would like a call back to discuss.

## 2018-01-08 DIAGNOSIS — M5440 Lumbago with sciatica, unspecified side: Secondary | ICD-10-CM | POA: Diagnosis not present

## 2018-01-08 DIAGNOSIS — M5126 Other intervertebral disc displacement, lumbar region: Secondary | ICD-10-CM | POA: Diagnosis not present

## 2018-01-28 DIAGNOSIS — L820 Inflamed seborrheic keratosis: Secondary | ICD-10-CM | POA: Diagnosis not present

## 2018-01-28 DIAGNOSIS — L821 Other seborrheic keratosis: Secondary | ICD-10-CM | POA: Diagnosis not present

## 2018-01-28 DIAGNOSIS — I788 Other diseases of capillaries: Secondary | ICD-10-CM | POA: Diagnosis not present

## 2018-01-31 DIAGNOSIS — M47816 Spondylosis without myelopathy or radiculopathy, lumbar region: Secondary | ICD-10-CM | POA: Diagnosis not present

## 2018-01-31 DIAGNOSIS — M5126 Other intervertebral disc displacement, lumbar region: Secondary | ICD-10-CM | POA: Diagnosis not present

## 2018-02-26 DIAGNOSIS — M5126 Other intervertebral disc displacement, lumbar region: Secondary | ICD-10-CM | POA: Diagnosis not present

## 2018-02-26 DIAGNOSIS — Z6825 Body mass index (BMI) 25.0-25.9, adult: Secondary | ICD-10-CM | POA: Diagnosis not present

## 2018-02-26 DIAGNOSIS — R030 Elevated blood-pressure reading, without diagnosis of hypertension: Secondary | ICD-10-CM | POA: Diagnosis not present

## 2018-02-26 DIAGNOSIS — M5416 Radiculopathy, lumbar region: Secondary | ICD-10-CM | POA: Diagnosis not present

## 2018-03-06 ENCOUNTER — Encounter: Payer: Self-pay | Admitting: Family Medicine

## 2018-03-06 ENCOUNTER — Ambulatory Visit (INDEPENDENT_AMBULATORY_CARE_PROVIDER_SITE_OTHER): Payer: Medicare Other | Admitting: Family Medicine

## 2018-03-06 VITALS — BP 117/72 | HR 67 | Temp 98.4°F | Ht 72.0 in | Wt 190.0 lb

## 2018-03-06 DIAGNOSIS — J069 Acute upper respiratory infection, unspecified: Secondary | ICD-10-CM

## 2018-03-06 MED ORDER — TRIAMCINOLONE ACETONIDE 40 MG/ML IJ SUSP
40.0000 mg | Freq: Once | INTRAMUSCULAR | Status: AC
  Administered 2018-03-06: 40 mg via INTRAMUSCULAR

## 2018-03-06 MED ORDER — BENZONATATE 200 MG PO CAPS: 200.0000 mg | ORAL_CAPSULE | Freq: Three times a day (TID) | ORAL | 0 refills | Status: DC | PRN

## 2018-03-06 MED ORDER — AMOXICILLIN-POT CLAVULANATE 875-125 MG PO TABS: 1.0000 | ORAL_TABLET | Freq: Two times a day (BID) | ORAL | 0 refills | Status: DC

## 2018-03-06 NOTE — Progress Notes (Signed)
BP 117/72   Pulse 67   Temp 98.4 F (36.9 C) (Oral)   Ht 6' (1.829 m)   Wt 190 lb (86.2 kg)   SpO2 99%   BMI 25.77 kg/m    Subjective:    Patient ID: Sean Reyes, male    DOB: 19-Mar-1940, 78 y.o.   MRN: [id number]  HPI: Sean Reyes is a 78 y.o. male  Chief Complaint  Patient presents with  . URI    Congestion, cough dry and productive. Feels like something is in his throat   Hacking productive cough, congestion, sinus pain and pressure, fatigue x 1.5 weeks. Denies fevers, chills, CP, SOB, wheezing. Taking mucinex with some relief. Several sick contacts, and has been in the hospital recently with his wife who just had a knee replacement last week.   Relevant past medical, surgical, family and social history reviewed and updated as indicated. Interim medical history since our last visit reviewed. Allergies and medications reviewed and updated.  Review of Systems  Per HPI unless specifically indicated above     Objective:    BP 117/72   Pulse 67   Temp 98.4 F (36.9 C) (Oral)   Ht 6' (1.829 m)   Wt 190 lb (86.2 kg)   SpO2 99%   BMI 25.77 kg/m   Wt Readings from Last 3 Encounters:  03/06/18 190 lb (86.2 kg)  12/20/17 193 lb 8 oz (87.8 kg)  08/22/17 191 lb (86.6 kg)    Physical Exam Vitals signs and nursing note reviewed.  Constitutional:      Appearance: He is well-developed.  HENT:     Head: Atraumatic.     Right Ear: External ear normal.     Left Ear: External ear normal.     Nose: Congestion present.     Mouth/Throat:     Pharynx: Posterior oropharyngeal erythema present. No oropharyngeal exudate.  Eyes:     Conjunctiva/sclera: Conjunctivae normal.     Pupils: Pupils are equal, round, and reactive to light.  Neck:     Musculoskeletal: Normal range of motion and neck supple.  Cardiovascular:     Rate and Rhythm: Normal rate and regular rhythm.  Pulmonary:     Effort: Pulmonary effort is normal. No respiratory distress.     Breath sounds: No  wheezing or rales.  Musculoskeletal: Normal range of motion.  Lymphadenopathy:     Cervical: No cervical adenopathy.  Skin:    General: Skin is warm and dry.  Neurological:     Mental Status: He is alert and oriented to person, place, and time.  Psychiatric:        Behavior: Behavior normal.     Results for orders placed or performed in visit on 12/20/17  Urinalysis, Routine w reflex microscopic  Result Value Ref Range   Specific Gravity, UA 1.020 1.005 - 1.030   pH, UA 5.5 5.0 - 7.5   Color, UA Yellow Yellow   Appearance Ur Clear Clear   Leukocytes, UA Negative Negative   Protein, UA Negative Negative/Trace   Glucose, UA 2+ (A) Negative   Ketones, UA Negative Negative   RBC, UA Negative Negative   Bilirubin, UA Negative Negative   Urobilinogen, Ur 0.2 0.2 - 1.0 mg/dL   Nitrite, UA Negative Negative  CBC with Differential/Platelet  Result Value Ref Range   WBC 8.4 3.4 - 10.8 x10E3/uL   RBC 3.98 (L) 4.14 - 5.80 x10E6/uL   Hemoglobin 12.1 (L) 13.0 - 17.7 g/dL   Hematocrit  35.7 (L) 37.5 - 51.0 %   MCV 90 79 - 97 fL   MCH 30.4 26.6 - 33.0 pg   MCHC 33.9 31.5 - 35.7 g/dL   RDW 14.4 12.3 - 15.4 %   Platelets 187 150 - 450 x10E3/uL   Neutrophils 68 Not Estab. %   Lymphs 22 Not Estab. %   Monocytes 9 Not Estab. %   Eos 1 Not Estab. %   Basos 0 Not Estab. %   Neutrophils Absolute 5.7 1.4 - 7.0 x10E3/uL   Lymphocytes Absolute 1.9 0.7 - 3.1 x10E3/uL   Monocytes Absolute 0.7 0.1 - 0.9 x10E3/uL   EOS (ABSOLUTE) 0.1 0.0 - 0.4 x10E3/uL   Basophils Absolute 0.0 0.0 - 0.2 x10E3/uL   Immature Granulocytes 0 Not Estab. %   Immature Grans (Abs) 0.0 0.0 - 0.1 x10E3/uL  TSH  Result Value Ref Range   TSH 3.510 0.450 - 4.500 uIU/mL  Lipid panel  Result Value Ref Range   Cholesterol, Total 156 100 - 199 mg/dL   Triglycerides 175 (H) 0 - 149 mg/dL   HDL 43 >39 mg/dL   VLDL Cholesterol Cal 35 5 - 40 mg/dL   LDL Calculated 78 0 - 99 mg/dL   Chol/HDL Ratio 3.6 0.0 - 5.0 ratio    Comprehensive metabolic panel  Result Value Ref Range   Glucose 172 (H) 65 - 99 mg/dL   BUN 16 8 - 27 mg/dL   Creatinine, Ser 1.17 0.76 - 1.27 mg/dL   GFR calc non Af Amer 61 >59 mL/min/1.73   GFR calc Af Amer 70 >59 mL/min/1.73   BUN/Creatinine Ratio 14 10 - 24   Sodium 136 134 - 144 mmol/L   Potassium 3.7 3.5 - 5.2 mmol/L   Chloride 97 96 - 106 mmol/L   CO2 25 20 - 29 mmol/L   Calcium 9.6 8.6 - 10.2 mg/dL   Total Protein 6.6 6.0 - 8.5 g/dL   Albumin 4.1 3.5 - 4.8 g/dL   Globulin, Total 2.5 1.5 - 4.5 g/dL   Albumin/Globulin Ratio 1.6 1.2 - 2.2   Bilirubin Total 0.4 0.0 - 1.2 mg/dL   Alkaline Phosphatase 68 39 - 117 IU/L   AST 17 0 - 40 IU/L   ALT 15 0 - 44 IU/L  Bayer DCA Hb A1c Waived  Result Value Ref Range   HB A1C (BAYER DCA - WAIVED) 6.9 <7.0 %      Assessment & Plan:   Problem List Items Addressed This Visit    None    Visit Diagnoses    Upper respiratory tract infection, unspecified type    -  Primary   IM Kenalog given in clinic, continue supportive home care. Augmentin sent if not improving or worsening over the next few days.    Relevant Medications   triamcinolone acetonide (KENALOG-40) injection 40 mg (Completed)       Follow up plan: Return for as scheduled.

## 2018-03-11 ENCOUNTER — Ambulatory Visit (INDEPENDENT_AMBULATORY_CARE_PROVIDER_SITE_OTHER): Payer: Medicare Other | Admitting: Family Medicine

## 2018-03-11 ENCOUNTER — Other Ambulatory Visit: Payer: Self-pay | Admitting: Gastroenterology

## 2018-03-11 ENCOUNTER — Encounter: Payer: Self-pay | Admitting: Family Medicine

## 2018-03-11 VITALS — BP 143/76 | HR 62 | Temp 97.9°F | Ht 72.0 in | Wt 188.6 lb

## 2018-03-11 DIAGNOSIS — J069 Acute upper respiratory infection, unspecified: Secondary | ICD-10-CM | POA: Diagnosis not present

## 2018-03-11 DIAGNOSIS — R1310 Dysphagia, unspecified: Secondary | ICD-10-CM | POA: Diagnosis not present

## 2018-03-11 MED ORDER — HYDROCOD POLST-CPM POLST ER 10-8 MG/5ML PO SUER: 5.0000 mL | Freq: Every evening | ORAL | 0 refills | Status: DC | PRN

## 2018-03-11 MED ORDER — PREDNISONE 20 MG PO TABS: 40.0000 mg | ORAL_TABLET | Freq: Every day | ORAL | 0 refills | Status: DC

## 2018-03-11 NOTE — Progress Notes (Signed)
BP (!) 143/76 (BP Location: Right Arm, Patient Position: Sitting, Cuff Size: Large)   Pulse 62   Temp 97.9 F (36.6 C)   Ht 6' (1.829 m)   Wt 188 lb 9 oz (85.5 kg)   SpO2 98%   BMI 25.57 kg/m    Subjective:    Patient ID: Sean Reyes, male    DOB: 1940-04-14, 78 y.o.   MRN: [id number]  HPI: Sean Reyes is a 78 y.o. male  Chief Complaint  Patient presents with  . Cough    Started Saturday night, this is when he started the antibiotics   Here today following up on URI from last week. Started feeling better after IM kenalog but started 2 days ago with dry, hacking cough. Started his augmentin at that time. Has not noticed improvement on that. Can't sleep at night from all the hacking. Denies CP, SOB, wheezes, fevers.   Relevant past medical, surgical, family and social history reviewed and updated as indicated. Interim medical history since our last visit reviewed. Allergies and medications reviewed and updated.  Review of Systems  Per HPI unless specifically indicated above     Objective:    BP (!) 143/76 (BP Location: Right Arm, Patient Position: Sitting, Cuff Size: Large)   Pulse 62   Temp 97.9 F (36.6 C)   Ht 6' (1.829 m)   Wt 188 lb 9 oz (85.5 kg)   SpO2 98%   BMI 25.57 kg/m   Wt Readings from Last 3 Encounters:  03/11/18 188 lb 9 oz (85.5 kg)  03/06/18 190 lb (86.2 kg)  12/20/17 193 lb 8 oz (87.8 kg)    Physical Exam Vitals signs and nursing note reviewed.  Constitutional:      Appearance: He is well-developed.  HENT:     Head: Atraumatic.     Right Ear: External ear normal.     Left Ear: External ear normal.     Nose: Congestion present.     Mouth/Throat:     Pharynx: Posterior oropharyngeal erythema present. No oropharyngeal exudate.  Eyes:     Conjunctiva/sclera: Conjunctivae normal.     Pupils: Pupils are equal, round, and reactive to light.  Neck:     Musculoskeletal: Normal range of motion and neck supple.  Cardiovascular:     Rate and  Rhythm: Normal rate and regular rhythm.  Pulmonary:     Effort: Pulmonary effort is normal. No respiratory distress.     Breath sounds: No wheezing or rales.  Musculoskeletal: Normal range of motion.  Lymphadenopathy:     Cervical: No cervical adenopathy.  Skin:    General: Skin is warm and dry.  Neurological:     Mental Status: He is alert and oriented to person, place, and time.  Psychiatric:        Behavior: Behavior normal.     Results for orders placed or performed in visit on 12/20/17  Urinalysis, Routine w reflex microscopic  Result Value Ref Range   Specific Gravity, UA 1.020 1.005 - 1.030   pH, UA 5.5 5.0 - 7.5   Color, UA Yellow Yellow   Appearance Ur Clear Clear   Leukocytes, UA Negative Negative   Protein, UA Negative Negative/Trace   Glucose, UA 2+ (A) Negative   Ketones, UA Negative Negative   RBC, UA Negative Negative   Bilirubin, UA Negative Negative   Urobilinogen, Ur 0.2 0.2 - 1.0 mg/dL   Nitrite, UA Negative Negative  CBC with Differential/Platelet  Result Value Ref Range  WBC 8.4 3.4 - 10.8 x10E3/uL   RBC 3.98 (L) 4.14 - 5.80 x10E6/uL   Hemoglobin 12.1 (L) 13.0 - 17.7 g/dL   Hematocrit 35.7 (L) 37.5 - 51.0 %   MCV 90 79 - 97 fL   MCH 30.4 26.6 - 33.0 pg   MCHC 33.9 31.5 - 35.7 g/dL   RDW 14.4 12.3 - 15.4 %   Platelets 187 150 - 450 x10E3/uL   Neutrophils 68 Not Estab. %   Lymphs 22 Not Estab. %   Monocytes 9 Not Estab. %   Eos 1 Not Estab. %   Basos 0 Not Estab. %   Neutrophils Absolute 5.7 1.4 - 7.0 x10E3/uL   Lymphocytes Absolute 1.9 0.7 - 3.1 x10E3/uL   Monocytes Absolute 0.7 0.1 - 0.9 x10E3/uL   EOS (ABSOLUTE) 0.1 0.0 - 0.4 x10E3/uL   Basophils Absolute 0.0 0.0 - 0.2 x10E3/uL   Immature Granulocytes 0 Not Estab. %   Immature Grans (Abs) 0.0 0.0 - 0.1 x10E3/uL  TSH  Result Value Ref Range   TSH 3.510 0.450 - 4.500 uIU/mL  Lipid panel  Result Value Ref Range   Cholesterol, Total 156 100 - 199 mg/dL   Triglycerides 175 (H) 0 - 149  mg/dL   HDL 43 >39 mg/dL   VLDL Cholesterol Cal 35 5 - 40 mg/dL   LDL Calculated 78 0 - 99 mg/dL   Chol/HDL Ratio 3.6 0.0 - 5.0 ratio  Comprehensive metabolic panel  Result Value Ref Range   Glucose 172 (H) 65 - 99 mg/dL   BUN 16 8 - 27 mg/dL   Creatinine, Ser 1.17 0.76 - 1.27 mg/dL   GFR calc non Af Amer 61 >59 mL/min/1.73   GFR calc Af Amer 70 >59 mL/min/1.73   BUN/Creatinine Ratio 14 10 - 24   Sodium 136 134 - 144 mmol/L   Potassium 3.7 3.5 - 5.2 mmol/L   Chloride 97 96 - 106 mmol/L   CO2 25 20 - 29 mmol/L   Calcium 9.6 8.6 - 10.2 mg/dL   Total Protein 6.6 6.0 - 8.5 g/dL   Albumin 4.1 3.5 - 4.8 g/dL   Globulin, Total 2.5 1.5 - 4.5 g/dL   Albumin/Globulin Ratio 1.6 1.2 - 2.2   Bilirubin Total 0.4 0.0 - 1.2 mg/dL   Alkaline Phosphatase 68 39 - 117 IU/L   AST 17 0 - 40 IU/L   ALT 15 0 - 44 IU/L  Bayer DCA Hb A1c Waived  Result Value Ref Range   HB A1C (BAYER DCA - WAIVED) 6.9 <7.0 %      Assessment & Plan:   Problem List Items Addressed This Visit    None    Visit Diagnoses    Upper respiratory tract infection, unspecified type    -  Primary   Continue augmentin, add prednisone and bedtime tussionex. Supportive care reviewed. F/u if still not improving       Follow up plan: Return for as scheduled.

## 2018-03-15 ENCOUNTER — Other Ambulatory Visit: Payer: Self-pay | Admitting: Gastroenterology

## 2018-03-15 ENCOUNTER — Ambulatory Visit
Admission: RE | Admit: 2018-03-15 | Discharge: 2018-03-15 | Disposition: A | Discharge status: Home / Self Care | Payer: Medicare Other | Source: Ambulatory Visit | Attending: Gastroenterology | Admitting: Gastroenterology

## 2018-03-15 DIAGNOSIS — R1310 Dysphagia, unspecified: Secondary | ICD-10-CM

## 2018-03-15 DIAGNOSIS — K219 Gastro-esophageal reflux disease without esophagitis: Secondary | ICD-10-CM | POA: Diagnosis not present

## 2018-03-25 ENCOUNTER — Ambulatory Visit: Payer: Medicare Other | Admitting: Urology

## 2018-03-25 ENCOUNTER — Encounter: Payer: Self-pay | Admitting: Urology

## 2018-03-25 ENCOUNTER — Other Ambulatory Visit: Payer: Self-pay | Admitting: Urology

## 2018-03-28 ENCOUNTER — Other Ambulatory Visit: Payer: Self-pay | Admitting: Urology

## 2018-03-29 ENCOUNTER — Encounter: Payer: Self-pay | Admitting: *Deleted

## 2018-04-01 ENCOUNTER — Other Ambulatory Visit: Payer: Self-pay

## 2018-04-01 ENCOUNTER — Ambulatory Visit: Payer: Medicare Other | Admitting: Anesthesiology

## 2018-04-01 ENCOUNTER — Ambulatory Visit
Admission: RE | Admit: 2018-04-01 | Discharge: 2018-04-01 | Disposition: A | Discharge status: Home / Self Care | Payer: Medicare Other | Attending: Gastroenterology | Admitting: Gastroenterology

## 2018-04-01 ENCOUNTER — Encounter
Admission: RE | Disposition: A | Discharge status: Home / Self Care | Payer: Self-pay | Source: Home / Self Care | Attending: Gastroenterology

## 2018-04-01 ENCOUNTER — Encounter: Payer: Self-pay | Admitting: Anesthesiology

## 2018-04-01 DIAGNOSIS — G4730 Sleep apnea, unspecified: Secondary | ICD-10-CM | POA: Diagnosis not present

## 2018-04-01 DIAGNOSIS — K2210 Ulcer of esophagus without bleeding: Secondary | ICD-10-CM | POA: Diagnosis not present

## 2018-04-01 DIAGNOSIS — Z791 Long term (current) use of non-steroidal anti-inflammatories (NSAID): Secondary | ICD-10-CM | POA: Diagnosis not present

## 2018-04-01 DIAGNOSIS — K449 Diaphragmatic hernia without obstruction or gangrene: Secondary | ICD-10-CM | POA: Diagnosis not present

## 2018-04-01 DIAGNOSIS — I4891 Unspecified atrial fibrillation: Secondary | ICD-10-CM | POA: Diagnosis not present

## 2018-04-01 DIAGNOSIS — I10 Essential (primary) hypertension: Secondary | ICD-10-CM | POA: Insufficient documentation

## 2018-04-01 DIAGNOSIS — R1310 Dysphagia, unspecified: Secondary | ICD-10-CM | POA: Insufficient documentation

## 2018-04-01 DIAGNOSIS — E1122 Type 2 diabetes mellitus with diabetic chronic kidney disease: Secondary | ICD-10-CM | POA: Diagnosis not present

## 2018-04-01 DIAGNOSIS — Z7901 Long term (current) use of anticoagulants: Secondary | ICD-10-CM | POA: Diagnosis not present

## 2018-04-01 DIAGNOSIS — K224 Dyskinesia of esophagus: Secondary | ICD-10-CM | POA: Diagnosis not present

## 2018-04-01 DIAGNOSIS — K317 Polyp of stomach and duodenum: Secondary | ICD-10-CM | POA: Insufficient documentation

## 2018-04-01 DIAGNOSIS — K210 Gastro-esophageal reflux disease with esophagitis: Secondary | ICD-10-CM | POA: Diagnosis not present

## 2018-04-01 DIAGNOSIS — I129 Hypertensive chronic kidney disease with stage 1 through stage 4 chronic kidney disease, or unspecified chronic kidney disease: Secondary | ICD-10-CM | POA: Diagnosis not present

## 2018-04-01 DIAGNOSIS — F419 Anxiety disorder, unspecified: Secondary | ICD-10-CM | POA: Diagnosis not present

## 2018-04-01 DIAGNOSIS — Z79899 Other long term (current) drug therapy: Secondary | ICD-10-CM | POA: Diagnosis not present

## 2018-04-01 DIAGNOSIS — E782 Mixed hyperlipidemia: Secondary | ICD-10-CM | POA: Diagnosis not present

## 2018-04-01 DIAGNOSIS — N183 Chronic kidney disease, stage 3 (moderate): Secondary | ICD-10-CM | POA: Diagnosis not present

## 2018-04-01 DIAGNOSIS — E119 Type 2 diabetes mellitus without complications: Secondary | ICD-10-CM | POA: Diagnosis not present

## 2018-04-01 DIAGNOSIS — Z7984 Long term (current) use of oral hypoglycemic drugs: Secondary | ICD-10-CM | POA: Diagnosis not present

## 2018-04-01 DIAGNOSIS — G4733 Obstructive sleep apnea (adult) (pediatric): Secondary | ICD-10-CM | POA: Diagnosis not present

## 2018-04-01 DIAGNOSIS — R12 Heartburn: Secondary | ICD-10-CM | POA: Diagnosis not present

## 2018-04-01 HISTORY — PX: ESOPHAGOGASTRODUODENOSCOPY (EGD) WITH PROPOFOL: SHX5813

## 2018-04-01 SURGERY — ESOPHAGOGASTRODUODENOSCOPY (EGD) WITH PROPOFOL
Anesthesia: General

## 2018-04-01 MED ORDER — LIDOCAINE HCL (PF) 2 % IJ SOLN
INTRAMUSCULAR | Status: AC
  Filled 2018-04-01: qty 10

## 2018-04-01 MED ORDER — PROPOFOL 10 MG/ML IV BOLUS
INTRAVENOUS | Status: DC | PRN
  Administered 2018-04-01: 50 mg via INTRAVENOUS
  Administered 2018-04-01: 30 mg via INTRAVENOUS

## 2018-04-01 MED ORDER — PROPOFOL 500 MG/50ML IV EMUL
INTRAVENOUS | Status: DC | PRN
  Administered 2018-04-01: 100 ug/kg/min via INTRAVENOUS

## 2018-04-01 MED ORDER — LIDOCAINE HCL (CARDIAC) PF 100 MG/5ML IV SOSY
PREFILLED_SYRINGE | INTRAVENOUS | Status: DC | PRN
  Administered 2018-04-01: 80 mg via INTRATRACHEAL

## 2018-04-01 MED ORDER — SODIUM CHLORIDE 0.9 % IV SOLN
INTRAVENOUS | Status: DC
  Administered 2018-04-01: 10:00:00 via INTRAVENOUS

## 2018-04-01 MED ORDER — PROPOFOL 10 MG/ML IV BOLUS
INTRAVENOUS | Status: AC
  Filled 2018-04-01: qty 40

## 2018-04-01 MED ORDER — FINASTERIDE 5 MG PO TABS: 5.0000 mg | ORAL_TABLET | Freq: Every day | ORAL | 0 refills | Status: DC

## 2018-04-01 NOTE — Anesthesia Post-op Follow-up Note (Signed)
Anesthesia QCDR form completed.        

## 2018-04-01 NOTE — Op Note (Signed)
[facility] Patient Name: Sean Reyes Procedure Date: 04/01/2018 9:48 AM MRN: [id number] Account #: [id number] Date of Birth: 1940-12-10 Admit Type: Outpatient Age: 78 Room: [facility] ENDO ROOM 1 Gender: Male Note Status: Finalized Procedure:            Upper GI endoscopy Indications:          Dysphagia Providers:            Lollie Sails, MD Medicines:            Monitored Anesthesia Care Complications:        No immediate complications. Procedure:            Pre-Anesthesia Assessment:                       - ASA Grade Assessment: III - A patient with severe                        systemic disease.                       After obtaining informed consent, the endoscope was                        passed under direct vision. Throughout the procedure,                        the patient's blood pressure, pulse, and oxygen                        saturations were monitored continuously. The Endoscope                        was introduced through the mouth, and advanced to the                        third part of duodenum. The upper GI endoscopy was                        accomplished without difficulty. The patient tolerated                        the procedure well. Findings:      A small area of extrinsic compression was found in the proximal       esophagus, appearing as an extrinsic "shelf". There is no overlying       mucosal defect.      Abnormal motility was noted in the lower third of the esophagus. There       is spasticity of the esophageal body. Secondary peristaltic waves are       noted.      LA Grade B (one or more mucosal breaks greater than 5 mm, not extending       between the tops of two mucosal folds) esophagitis with no bleeding was       found. Biopsies were taken with a cold forceps for histology.      A small hiatal hernia was found. The Z-line was a variable distance from       incisors; the hiatal hernia was sliding.  The entire examined stomach was normal. Biopsies were taken with a cold       forceps for histology.  The cardia and gastric fundus were normal on retroflexion otherwise.      The examined duodenum was normal. Note minimal irritation in the       posterior duodenal bulb. Impression:           - Extrinsic compression in the proximal esophagus.                       - Abnormal esophageal motility.                       - LA Grade B reflux esophagitis. Biopsied.                       - Small hiatal hernia.                       - Normal stomach. Biopsied.                       - Normal examined duodenum. Recommendation:       - Discharge patient to home.                       - Use Protonix (pantoprazole) 40 mg PO daily daily.                       - Consider a CT scan of the mid to lower neck if                        continued symptoms.                       - Return to GI clinic in 4 weeks. Procedure Code(s):    --- Professional ---                       [phone], Esophagogastroduodenoscopy, flexible, transoral;                        with biopsy, single or multiple Diagnosis Code(s):    --- Professional ---                       K22.2, Esophageal obstruction                       K22.4, Dyskinesia of esophagus                       K21.0, Gastro-esophageal reflux disease with esophagitis                       K44.9, Diaphragmatic hernia without obstruction or                        gangrene                       R13.10, Dysphagia, unspecified CPT copyright 2018 [employer]. All rights reserved. The codes documented in this report are preliminary and upon coder review may  be revised to meet current compliance requirements. Lollie Sails, MD 04/01/2018 10:39:21 AM This report has been signed electronically. Number of Addenda: 0 Note Initiated On: 04/01/2018 9:48 AM  [facility]

## 2018-04-01 NOTE — Transfer of Care (Signed)
Immediate Anesthesia Transfer of Care Note  Patient: Sean Reyes.  Procedure(s) Performed: ESOPHAGOGASTRODUODENOSCOPY (EGD) WITH PROPOFOL (N/A )  Patient Location: Endoscopy Unit  Anesthesia Type:General  Level of Consciousness: drowsy  Airway & Oxygen Therapy: Patient Spontanous Breathing and Patient connected to nasal cannula oxygen  Post-op Assessment: Report given to RN and Post -op Vital signs reviewed and stable  Post vital signs: stable  Last Vitals:  Vitals Value Taken Time  BP 110/73 04/01/2018 10:35 AM  Temp 36 C 04/01/2018 10:35 AM  Pulse 59 04/01/2018 10:37 AM  Resp 15 04/01/2018 10:37 AM  SpO2 99 % 04/01/2018 10:37 AM  Vitals shown include unvalidated device data.  Last Pain:  Vitals:   04/01/18 1035  TempSrc: Tympanic  PainSc: 0-No pain         Complications: No apparent anesthesia complications

## 2018-04-01 NOTE — H&P (Signed)
Outpatient short stay form Pre-procedure 04/01/2018 10:03 AM Lollie Sails MD  Primary Physician: Dr. Golden Pop  Reason for visit: EGD  History of present illness: Patient is a 78 year old male sending today for an EGD in regards to complaint of some dysphagia.  He did have a barium swallow on 03/15/2018 showing only mild gastroesophageal reflux with normal transit of a tablet.  Otherwise unremarkable study.  In further discussion with the patient much of this sounds like globus sensation though there may be some amount of esophagitis.  Of note he does have a history of some anxiety.  Not regurgitate foods.  Has not been on a proton pump inhibitor.  She does take Eliquis which she has held over 48 hours.    Current Facility-Administered Medications:  .  0.9 %  sodium chloride infusion, , Intravenous, Continuous, Lollie Sails, MD, Last Rate: 20 mL/hr at 04/01/18 0300  Medications Prior to Admission  Medication Sig Dispense Refill Last Dose  . amLODipine (NORVASC) 5 MG tablet Take 1 tablet (5 mg total) by mouth daily. 30 tablet 12 03/31/2018 at Unknown time  . benazepril (LOTENSIN) 40 MG tablet Take 1 tablet (40 mg total) by mouth daily. 30 tablet 12 03/31/2018 at Unknown time  . benzonatate (TESSALON) 200 MG capsule Take 1 capsule (200 mg total) by mouth 3 (three) times daily as needed. 30 capsule 0 03/31/2018 at Unknown time  . finasteride (PROSCAR) 5 MG tablet Take 1 tablet (5 mg total) by mouth daily. 90 tablet 0 03/31/2018 at Unknown time  . meloxicam (MOBIC) 7.5 MG tablet Take 7.5 mg by mouth daily.   03/31/2018 at Unknown time  . metFORMIN (GLUCOPHAGE-XR) 500 MG 24 hr tablet Take 2 tablets (1,000 mg total) by mouth 2 (two) times daily. 120 tablet 12 03/31/2018 at Unknown time  . metoprolol succinate (TOPROL-XL) 25 MG 24 hr tablet Take 1 tablet (25 mg total) by mouth 2 (two) times daily. 60 tablet 12 03/31/2018 at Unknown time  . propafenone (RYTHMOL SR) 425 MG 12 hr capsule Take 425  mg by mouth 2 (two) times daily.   03/31/2018 at Unknown time  . amoxicillin-clavulanate (AUGMENTIN) 875-125 MG tablet Take 1 tablet by mouth 2 (two) times daily. (Patient not taking: Reported on 04/01/2018) 14 tablet 0 Completed Course at Unknown time  . apixaban (ELIQUIS) 5 MG TABS tablet Take 1 tablet (5 mg total) by mouth 2 (two) times daily. 60 tablet 12 03/28/2018  . chlorpheniramine-HYDROcodone (TUSSIONEX PENNKINETIC ER) 10-8 MG/5ML SUER Take 5 mLs by mouth at bedtime as needed. (Patient not taking: Reported on 04/01/2018) 50 mL 0 Not Taking at Unknown time  . clomiPHENE (CLOMID) 50 MG tablet Take 50 mg by mouth daily.   Not Taking at Unknown time  . predniSONE (DELTASONE) 20 MG tablet Take 2 tablets (40 mg total) by mouth daily with breakfast. (Patient not taking: Reported on 04/01/2018) 10 tablet 0 Not Taking at Unknown time     Allergies  Allergen Reactions  . Levaquin [Levofloxacin In D5w] Anaphylaxis and Shortness Of Breath  . Shellfish Allergy Anaphylaxis  . Amiodarone Other (See Comments)    Tremors and thyroid toxicity Other reaction(s): Other Tremors and thyroid toxicity Tremors and thyroid toxicity     Past Medical History:  Diagnosis Date  . Anemia   . Anxiety   . Arthritis   . Atrial fibrillation ([place])   . Complication of anesthesia    pt reports low BP's after surgery at [facility] and difficulty  awakening  . Depression   . Diabetes ([place])   . Dysrhythmia   . GERD (gastroesophageal reflux disease)    OCC TAKES ALKA SELTZER  . Hyperlipidemia   . Hypertension   . Nocturia   . S/P ablation of atrial fibrillation    Ablative therapy  . Sleep apnea    CPAP   . Tachycardia, unspecified     Review of systems:      Physical Exam    Heart and lungs: Regular rate and rhythm without rub or gallop, lungs are bilaterally clear.    HEENT: Normocephalic atraumatic eyes are anicteric    Other:    Pertinant exam for procedure: Soft nontender nondistended bowel  sounds positive normoactive    Planned proceedures: EGD and indicated procedures. I have discussed the risks benefits and complications of procedures to include not limited to bleeding, infection, perforation and the risk of sedation and the patient wishes to proceed.    Lollie Sails, MD Gastroenterology 04/01/2018  10:03 AM

## 2018-04-01 NOTE — Anesthesia Postprocedure Evaluation (Signed)
Anesthesia Post Note  Patient: Sean Reyes.  Procedure(s) Performed: ESOPHAGOGASTRODUODENOSCOPY (EGD) WITH PROPOFOL (N/A )  Patient location during evaluation: Endoscopy Anesthesia Type: General Level of consciousness: awake and alert Pain management: pain level controlled Vital Signs Assessment: post-procedure vital signs reviewed and stable Respiratory status: spontaneous breathing, nonlabored ventilation, respiratory function stable and patient connected to nasal cannula oxygen Cardiovascular status: blood pressure returned to baseline and stable Postop Assessment: no apparent nausea or vomiting Anesthetic complications: no     Last Vitals:  Vitals:   04/01/18 0914 04/01/18 1035  BP: (!) 144/80 110/73  Pulse: (!) 57   Resp: 18 18  Temp: (!) 36.1 C (!) 36 C  SpO2: 100% 99%    Last Pain:  Vitals:   04/01/18 1117  TempSrc:   PainSc: 0-No pain                 Sean Reyes

## 2018-04-01 NOTE — Anesthesia Preprocedure Evaluation (Addendum)
Anesthesia Evaluation  Patient identified by MRN, date of birth, ID band Patient awake    Reviewed: Allergy & Precautions, H&P , NPO status , Patient's Chart, lab work & pertinent test results  History of Anesthesia Complications (+) PROLONGED EMERGENCE and history of anesthetic complications  Airway Mallampati: II  TM Distance: <3 FB Neck ROM: limited    Dental  (+) Chipped   Pulmonary neg shortness of breath, sleep apnea ,           Cardiovascular Exercise Tolerance: Good hypertension, + dysrhythmias Atrial Fibrillation      Neuro/Psych PSYCHIATRIC DISORDERS negative neurological ROS     GI/Hepatic Neg liver ROS, GERD  Medicated and Controlled,  Endo/Other  diabetes, Type 2  Renal/GU Renal disease  negative genitourinary   Musculoskeletal   Abdominal   Peds  Hematology negative hematology ROS (+)   Anesthesia Other Findings Past Medical History: No date: Anemia No date: Anxiety No date: Arthritis No date: Atrial fibrillation ([place]) No date: Complication of anesthesia     Comment:  pt reports low BP's after surgery at [facility] and               difficulty awakening No date: Depression No date: Diabetes ([place]) No date: Dysrhythmia No date: GERD (gastroesophageal reflux disease)     Comment:  OCC TAKES ALKA SELTZER No date: Hyperlipidemia No date: Hypertension No date: Nocturia No date: S/P ablation of atrial fibrillation     Comment:  Ablative therapy No date: Sleep apnea     Comment:  CPAP  No date: Tachycardia, unspecified  Past Surgical History: No date: ABLATION 06/27/2017: ANTERIOR LAT LUMBAR FUSION; N/A     Comment:  Procedure: Anterior Lateral Lumbar Interbody  Fusion -               Lumbar Two-Lumbar Three - Lumbar Three-Lumbar Four,               Posterior Lumbar Interbody Fusion Lumbar Four- Five;                Surgeon: Kary Kos, MD;  Location: [place];  Service:   Neurosurgery;  Laterality: N/A;  Anterior Lateral Lumbar               Interbody  Fusion - Lumbar Two-Lumbar Three - Lumbar               Three-Lumbar Four, Posterior Lumbar Interbody Fusion               Lumbar Four- Five No date: BACK SURGERY 10/05/2015: COLONOSCOPY WITH PROPOFOL; N/A     Comment:  Procedure: COLONOSCOPY WITH PROPOFOL;  Surgeon: Lollie Sails, MD;  Location[facility] ENDOSCOPY;  Service:               Endoscopy;  Laterality: N/A; No date: HERNIA REPAIR No date: JOINT REPLACEMENT; Bilateral     Comment:  hips  RT+  LEFT X2  09/13/2016: LUMBAR LAMINECTOMY/DECOMPRESSION MICRODISCECTOMY; Left     Comment:  Procedure: Microdiscectomy - Lumbar two-three,  Lumbar               three- - left;  Surgeon: Kary Kos, MD;  Location: [place];  Service: Neurosurgery;  Laterality: Left; No date: TONSILLECTOMY  BMI    Body Mass Index:  25.60 kg/m  Reproductive/Obstetrics negative OB ROS                            Anesthesia Physical Anesthesia Plan  ASA: III  Anesthesia Plan: General   Post-op Pain Management:    Induction: Intravenous  PONV Risk Score and Plan: Propofol infusion and TIVA  Airway Management Planned: Natural Airway and Nasal Cannula  Additional Equipment:   Intra-op Plan:   Post-operative Plan:   Informed Consent: I have reviewed the patients History and Physical, chart, labs and discussed the procedure including the risks, benefits and alternatives for the proposed anesthesia with the patient or authorized representative who has indicated his/her understanding and acceptance.     Dental Advisory Given  Plan Discussed with: Anesthesiologist, CRNA and Surgeon  Anesthesia Plan Comments: (Patient consented for risks of anesthesia including but not limited to:  - adverse reactions to medications - risk of intubation if required - damage to teeth, lips or other oral mucosa - sore throat or  hoarseness - Damage to heart, brain, lungs or loss of life  Patient voiced understanding.)        Anesthesia Quick Evaluation

## 2018-04-02 ENCOUNTER — Encounter: Payer: Self-pay | Admitting: Gastroenterology

## 2018-04-02 LAB — SURGICAL PATHOLOGY

## 2018-04-12 DIAGNOSIS — I10 Essential (primary) hypertension: Secondary | ICD-10-CM | POA: Diagnosis not present

## 2018-04-12 DIAGNOSIS — I480 Paroxysmal atrial fibrillation: Secondary | ICD-10-CM | POA: Diagnosis not present

## 2018-04-12 DIAGNOSIS — G4733 Obstructive sleep apnea (adult) (pediatric): Secondary | ICD-10-CM | POA: Diagnosis not present

## 2018-04-12 DIAGNOSIS — E782 Mixed hyperlipidemia: Secondary | ICD-10-CM | POA: Diagnosis not present

## 2018-04-17 ENCOUNTER — Other Ambulatory Visit: Payer: Self-pay | Admitting: Neurosurgery

## 2018-04-18 ENCOUNTER — Other Ambulatory Visit: Payer: Self-pay | Admitting: Neurosurgery

## 2018-04-18 NOTE — Pre-Procedure Instructions (Signed)
Danzig Macgregor Jr.  04/18/2018      [facility] [place], [place] - [place] [address] [place] [address] Phone: [phone] Fax: [place], [place] - [address] [place] [address] Phone: [phone] Fax: [phone]    Your procedure is scheduled on Monday, March 16th   Report to [facility] Admitting at 5:30 AM             (posted surgery time 7:30a - 10:30a)   Call this number if you have problems the morning of surgery:  [phone]   Remember:   Do not eat any foods or drink any liquids after midnight, Sunday.              7 days prior to surgery, STOP TAKING any Vitamins, Herbal Supplements, Anti-inflammatories, Blood Thinners    Take these medicines the morning of surgery with A SIP OF WATER : Amlodipine, Metoprolol, Pantoprazole.               As per your md's instructions, your last day of Eliquis will be ________________    Do not wear jewelry - NO RINGS  Do not wear lotions, colognes or deodorant.             Men may shave face and neck.   Do not bring valuables to the hospital.  [facility] is not responsible for any belongings or valuables.  Contacts, dentures or bridgework may not be worn into surgery.  Leave your suitcase in the car.  After surgery it may be brought to your room.  For patients admitted to the hospital, discharge time will be determined by your treatment team.  Please read over the following fact sheets that you were given. MRSA Information and Surgical Site Infection Prevention      [facility]- Preparing For Surgery  Before surgery, you can play an important role. Because skin is not sterile, your skin needs to be as free of germs as possible. You can reduce the number of germs on your skin by washing with CHG (chlorahexidine gluconate) Soap before surgery.  CHG is an antiseptic cleaner which kills germs and bonds with the skin to continue  killing germs even after washing.    Oral Hygiene is also important to reduce your risk of infection.    Remember - BRUSH YOUR TEETH THE MORNING OF SURGERY WITH YOUR REGULAR TOOTHPASTE  Please do not use if you have an allergy to CHG or antibacterial soaps. If your skin becomes reddened/irritated stop using the CHG.  Do not shave (including legs and underarms) for at least 48 hours prior to first CHG shower. It is OK to shave your face.  Please follow these instructions carefully.   1. Shower the NIGHT BEFORE SURGERY and the MORNING OF SURGERY with CHG.   2. If you chose to wash your hair, wash your hair first as usual with your normal shampoo.  3. After you shampoo, rinse your hair and body thoroughly to remove the shampoo.  4. Use CHG as you would any other liquid soap. You can apply CHG directly to the skin and wash gently with a scrungie or a clean washcloth.   5. Apply the CHG Soap to your body ONLY FROM THE NECK DOWN.  Do not use on open wounds or open sores. Avoid contact with your eyes, ears, mouth and genitals (private parts).  Wash Face and genitals (private parts)  with your normal soap.  6. Wash thoroughly, paying special attention to the area where your surgery will be performed.  7. Thoroughly rinse your body with warm water from the neck down.  8. DO NOT shower/wash with your normal soap after using and rinsing off the CHG Soap.  9. Pat yourself dry with a CLEAN TOWEL.  10. Wear CLEAN PAJAMAS to bed the night before surgery, wear comfortable clothes the morning of surgery  11. Place CLEAN SHEETS on your bed the night of your first shower and DO NOT SLEEP WITH PETS.  Day of Surgery:  Do not apply any deodorants/lotions.  Please wear clean clothes to the hospital/surgery center.   Remember to brush your teeth WITH YOUR REGULAR TOOTHPASTE.      How to Manage Your Diabetes Before and After Surgery  Why is it important to control my blood sugar before and after  surgery? . Improving blood sugar levels before and after surgery helps healing and can limit problems. . A way of improving blood sugar control is eating a healthy diet by: o  Eating less sugar and carbohydrates o  Increasing activity/exercise o  Talking with your doctor about reaching your blood sugar goals . High blood sugars (greater than 180 mg/dL) can raise your risk of infections and slow your recovery, so you will need to focus on controlling your diabetes during the weeks before surgery. . Make sure that the doctor who takes care of your diabetes knows about your planned surgery including the date and location.  How do I manage my blood sugar before surgery? . Check your blood sugar at least 4 times a day, starting 2 days before surgery, to make sure that the level is not too high or low. o Check your blood sugar the morning of your surgery when you wake up and every 2 hours until you get to the Short Stay unit. o  o  . If your blood sugar is less than 70 mg/dL, you will need to treat for low blood sugar: o Do not take insulin. o Treat a low blood sugar (less than 70 mg/dL) with  cup of clear juice (cranberry or apple), 4 glucose tablets, OR glucose gel.   Recheck blood sugar in 15 minutes after treatment (to make sure it is greater than 70 mg/dL). If your blood sugar is not greater than 70 mg/dL on recheck, call [phone] o  for further instructions. . Report your blood sugar to the short stay nurse when you get to Short Stay.  . If you are admitted to the hospital after surgery: o Your blood sugar will be checked by the staff and you will probably be given insulin after surgery (instead of oral diabetes medicines) to make sure you have good blood sugar levels. o The goal for blood sugar control after surgery is 80-180 mg/dL.  WHAT DO I DO ABOUT MY DIABETES MEDICATION?   [name] Do not take oral diabetes medicines (pills) the morning of surgery.  Patient  Signature:  Date:   Nurse Signature:  Date:   Reviewed and Endorsed by [facility] Patient Education Committee, August 2015

## 2018-04-19 ENCOUNTER — Encounter (HOSPITAL_COMMUNITY)
Admission: RE | Admit: 2018-04-19 | Discharge: 2018-04-19 | Disposition: A | Discharge status: Home / Self Care | Payer: Medicare Other | Source: Ambulatory Visit | Attending: Neurosurgery | Admitting: Neurosurgery

## 2018-04-19 ENCOUNTER — Other Ambulatory Visit: Payer: Self-pay

## 2018-04-19 ENCOUNTER — Telehealth: Payer: Self-pay

## 2018-04-19 ENCOUNTER — Encounter (HOSPITAL_COMMUNITY): Payer: Self-pay

## 2018-04-19 DIAGNOSIS — Z01812 Encounter for preprocedural laboratory examination: Secondary | ICD-10-CM | POA: Diagnosis present

## 2018-04-19 DIAGNOSIS — Z01818 Encounter for other preprocedural examination: Secondary | ICD-10-CM | POA: Insufficient documentation

## 2018-04-19 DIAGNOSIS — E119 Type 2 diabetes mellitus without complications: Secondary | ICD-10-CM

## 2018-04-19 HISTORY — DX: Personal history of urinary calculi: Z87.442

## 2018-04-19 HISTORY — DX: Unspecified hearing loss, unspecified ear: H91.90

## 2018-04-19 HISTORY — DX: Unspecified cataract: H26.9

## 2018-04-19 LAB — CBC
HCT: 39.2 % (ref 39.0–52.0)
Hemoglobin: 13.1 g/dL (ref 13.0–17.0)
MCH: 31 pg (ref 26.0–34.0)
MCHC: 33.4 g/dL (ref 30.0–36.0)
MCV: 92.9 fL (ref 80.0–100.0)
Platelets: 171 10*3/uL (ref 150–400)
RBC: 4.22 MIL/uL (ref 4.22–5.81)
RDW: 13.2 % (ref 11.5–15.5)
WBC: 6.6 10*3/uL (ref 4.0–10.5)
nRBC: 0 % (ref 0.0–0.2)

## 2018-04-19 LAB — BASIC METABOLIC PANEL
Anion gap: 11 (ref 5–15)
BUN: 18 mg/dL (ref 8–23)
CO2: 24 mmol/L (ref 22–32)
Calcium: 9.6 mg/dL (ref 8.9–10.3)
Chloride: 102 mmol/L (ref 98–111)
Creatinine, Ser: 1.15 mg/dL (ref 0.61–1.24)
GFR calc Af Amer: >60 mL/min (ref >60)
GFR calc non Af Amer: >60 mL/min (ref >60)
Glucose, Bld: 208 mg/dL — ABNORMAL HIGH (ref 70–99)
Potassium: 4.1 mmol/L (ref 3.5–5.1)
Sodium: 137 mmol/L (ref 135–145)

## 2018-04-19 LAB — HEMOGLOBIN A1C
Hgb A1c MFr Bld: 6.8 % — ABNORMAL HIGH (ref 4.8–5.6)
Mean Plasma Glucose: 148.46 mg/dL

## 2018-04-19 LAB — TYPE AND SCREEN
ABO/RH(D): A POS
Antibody Screen: NEGATIVE

## 2018-04-19 LAB — GLUCOSE, CAPILLARY: Glucose-Capillary: 221 mg/dL — ABNORMAL HIGH (ref 70–99)

## 2018-04-19 LAB — SURGICAL PCR SCREEN
MRSA, PCR: NEGATIVE
Staphylococcus aureus: NEGATIVE

## 2018-04-19 NOTE — Progress Notes (Signed)
PCP - Dr. Golden Pop  LOV 03/2018  Cardiologist - Dr. Nehemiah Massed  LOV 04/2018  (There's note in CE - also concerning eliquis) H/o PAF with ablations x 2  Chest x-ray - DOS EKG - 04/2018 in [facility] (I requested a "picture") Stress Test - 2018 ECHO - 2018 Cardiac Cath -   Sleep Study - he thinks 4-5 yrs ago. CPAP - he has it, doesn't wear it.  Fasting Blood Sugar - 120 - 140 Checks Blood Sugar every 2-3 days  Blood Thinner Instructions: cardio states 3 days, surgeon wants 4.  I have placed a call to Dr. Annamaria Boots' assistant..  Aspirin Instructions:  Anesthesia review: yes  Patient denies shortness of breath, fever, cough and chest pain at PAT appointment   Patient verbalized understanding of instructions that were given to them at the PAT appointment. Patient was also instructed that they will need to review over the PAT instructions again at home before surgery.

## 2018-04-19 NOTE — Telephone Encounter (Signed)
Patient is requesting a 90 day supply of metformin to be sent to [place] [facility]

## 2018-04-22 MED ORDER — METFORMIN HCL ER 500 MG PO TB24: 1000.0000 mg | ORAL_TABLET | Freq: Two times a day (BID) | ORAL | 3 refills | Status: DC

## 2018-04-22 NOTE — Anesthesia Preprocedure Evaluation (Addendum)
Anesthesia Evaluation  Patient identified by MRN, date of birth, ID band Patient awake    Reviewed: Allergy & Precautions, NPO status , Patient's Chart, lab work & pertinent test results  Airway Mallampati: II  TM Distance: >3 FB Neck ROM: Full    Dental  (+) Teeth Intact, Dental Advisory Given   Pulmonary sleep apnea ,    Pulmonary exam normal        Cardiovascular hypertension, Pt. on medications Normal cardiovascular exam+ dysrhythmias Atrial Fibrillation      Neuro/Psych Anxiety Depression    GI/Hepatic GERD  Medicated and Controlled,  Endo/Other  diabetes, Type 2, Oral Hypoglycemic Agents  Renal/GU Renal InsufficiencyRenal disease     Musculoskeletal   Abdominal   Peds  Hematology   Anesthesia Other Findings   Reproductive/Obstetrics                           Anesthesia Physical Anesthesia Plan  ASA: III  Anesthesia Plan: General   Post-op Pain Management:    Induction: Intravenous  PONV Risk Score and Plan: 2 and Ondansetron and Treatment may vary due to age or medical condition  Airway Management Planned: Oral ETT  Additional Equipment:   Intra-op Plan:   Post-operative Plan: Extubation in OR  Informed Consent:   Plan Discussed with:   Anesthesia Plan Comments: (Hx of afib s/p ablation. Cardiac clearance 04/12/18 by Dr. Nehemiah Massed in care everywhere: "Proceed to surgery and/or invasive procedure without restriction. The patient is at the lowest risk possible for cardiovascular complication as stated above. Patient may discontinue anticoagulation 3 days prior to surgical intervention and or procedure for reduced bleeding risk. The patient shall restart at a safe period after procedure when bleeding risk is reduced."  -Nuc stress 09/05/16 care everywhere: EF 67%, normal wall motion, normal myocardial perfusion without evidence of myocardial ischemia. -TTE 09/05/16 care  everywhere: EF 55%, normal wall motion, Mild MR, Mild TR -EKG on pt chart 04/12/2018 shows sinus rhythm with first-degree AV block.  Rate 60.  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  No significant change compared to previous.)      Anesthesia Quick Evaluation

## 2018-04-24 DIAGNOSIS — E119 Type 2 diabetes mellitus without complications: Secondary | ICD-10-CM | POA: Diagnosis not present

## 2018-04-24 DIAGNOSIS — H25813 Combined forms of age-related cataract, bilateral: Secondary | ICD-10-CM | POA: Diagnosis not present

## 2018-04-24 LAB — HM DIABETES EYE EXAM

## 2018-04-25 DIAGNOSIS — K219 Gastro-esophageal reflux disease without esophagitis: Secondary | ICD-10-CM | POA: Diagnosis not present

## 2018-04-25 DIAGNOSIS — R1310 Dysphagia, unspecified: Secondary | ICD-10-CM | POA: Diagnosis not present

## 2018-04-25 DIAGNOSIS — R634 Abnormal weight loss: Secondary | ICD-10-CM | POA: Diagnosis not present

## 2018-04-29 ENCOUNTER — Inpatient Hospital Stay (HOSPITAL_COMMUNITY): Payer: Medicare Other

## 2018-04-29 ENCOUNTER — Inpatient Hospital Stay (HOSPITAL_COMMUNITY): Payer: Medicare Other | Admitting: Physician Assistant

## 2018-04-29 ENCOUNTER — Encounter (HOSPITAL_COMMUNITY): Payer: Self-pay | Admitting: *Deleted

## 2018-04-29 ENCOUNTER — Encounter (HOSPITAL_COMMUNITY)
Admission: RE | Disposition: A | Discharge status: Home / Self Care | Payer: Self-pay | Source: Home / Self Care | Attending: Neurosurgery

## 2018-04-29 ENCOUNTER — Inpatient Hospital Stay (HOSPITAL_COMMUNITY)
Admission: RE | Admit: 2018-04-29 | Discharge: 2018-04-30 | DRG: 454 | Disposition: A | Discharge status: Home / Self Care | Payer: Medicare Other | Attending: Neurosurgery | Admitting: Neurosurgery

## 2018-04-29 ENCOUNTER — Other Ambulatory Visit: Payer: Self-pay

## 2018-04-29 DIAGNOSIS — M4327 Fusion of spine, lumbosacral region: Secondary | ICD-10-CM | POA: Diagnosis not present

## 2018-04-29 DIAGNOSIS — T84226A Displacement of internal fixation device of vertebrae, initial encounter: Secondary | ICD-10-CM | POA: Diagnosis not present

## 2018-04-29 DIAGNOSIS — Z91013 Allergy to seafood: Secondary | ICD-10-CM

## 2018-04-29 DIAGNOSIS — Y838 Other surgical procedures as the cause of abnormal reaction of the patient, or of later complication, without mention of misadventure at the time of the procedure: Secondary | ICD-10-CM | POA: Diagnosis present

## 2018-04-29 DIAGNOSIS — I10 Essential (primary) hypertension: Secondary | ICD-10-CM | POA: Diagnosis present

## 2018-04-29 DIAGNOSIS — Z888 Allergy status to other drugs, medicaments and biological substances status: Secondary | ICD-10-CM

## 2018-04-29 DIAGNOSIS — E119 Type 2 diabetes mellitus without complications: Secondary | ICD-10-CM | POA: Diagnosis present

## 2018-04-29 DIAGNOSIS — Z01818 Encounter for other preprocedural examination: Secondary | ICD-10-CM | POA: Diagnosis not present

## 2018-04-29 DIAGNOSIS — Z96643 Presence of artificial hip joint, bilateral: Secondary | ICD-10-CM | POA: Diagnosis present

## 2018-04-29 DIAGNOSIS — M5126 Other intervertebral disc displacement, lumbar region: Secondary | ICD-10-CM | POA: Diagnosis present

## 2018-04-29 DIAGNOSIS — Z881 Allergy status to other antibiotic agents status: Secondary | ICD-10-CM | POA: Diagnosis not present

## 2018-04-29 DIAGNOSIS — I4891 Unspecified atrial fibrillation: Secondary | ICD-10-CM | POA: Diagnosis not present

## 2018-04-29 DIAGNOSIS — M960 Pseudarthrosis after fusion or arthrodesis: Secondary | ICD-10-CM | POA: Diagnosis not present

## 2018-04-29 DIAGNOSIS — Y92009 Unspecified place in unspecified non-institutional (private) residence as the place of occurrence of the external cause: Secondary | ICD-10-CM

## 2018-04-29 DIAGNOSIS — Z7901 Long term (current) use of anticoagulants: Secondary | ICD-10-CM | POA: Diagnosis not present

## 2018-04-29 DIAGNOSIS — Z974 Presence of external hearing-aid: Secondary | ICD-10-CM

## 2018-04-29 DIAGNOSIS — H9193 Unspecified hearing loss, bilateral: Secondary | ICD-10-CM | POA: Diagnosis present

## 2018-04-29 DIAGNOSIS — M5137 Other intervertebral disc degeneration, lumbosacral region: Secondary | ICD-10-CM | POA: Diagnosis present

## 2018-04-29 DIAGNOSIS — K219 Gastro-esophageal reflux disease without esophagitis: Secondary | ICD-10-CM | POA: Diagnosis present

## 2018-04-29 DIAGNOSIS — Z87442 Personal history of urinary calculi: Secondary | ICD-10-CM

## 2018-04-29 DIAGNOSIS — Z91048 Other nonmedicinal substance allergy status: Secondary | ICD-10-CM | POA: Diagnosis not present

## 2018-04-29 DIAGNOSIS — G4730 Sleep apnea, unspecified: Secondary | ICD-10-CM | POA: Diagnosis present

## 2018-04-29 DIAGNOSIS — M5117 Intervertebral disc disorders with radiculopathy, lumbosacral region: Secondary | ICD-10-CM | POA: Diagnosis not present

## 2018-04-29 DIAGNOSIS — Z808 Family history of malignant neoplasm of other organs or systems: Secondary | ICD-10-CM

## 2018-04-29 DIAGNOSIS — Z87892 Personal history of anaphylaxis: Secondary | ICD-10-CM | POA: Diagnosis not present

## 2018-04-29 DIAGNOSIS — M4807 Spinal stenosis, lumbosacral region: Secondary | ICD-10-CM | POA: Diagnosis not present

## 2018-04-29 DIAGNOSIS — M5127 Other intervertebral disc displacement, lumbosacral region: Secondary | ICD-10-CM | POA: Diagnosis not present

## 2018-04-29 DIAGNOSIS — Z79899 Other long term (current) drug therapy: Secondary | ICD-10-CM

## 2018-04-29 DIAGNOSIS — Z01811 Encounter for preprocedural respiratory examination: Secondary | ICD-10-CM

## 2018-04-29 DIAGNOSIS — Z7984 Long term (current) use of oral hypoglycemic drugs: Secondary | ICD-10-CM

## 2018-04-29 DIAGNOSIS — Z419 Encounter for procedure for purposes other than remedying health state, unspecified: Secondary | ICD-10-CM

## 2018-04-29 LAB — GLUCOSE, CAPILLARY
Glucose-Capillary: 141 mg/dL — ABNORMAL HIGH (ref 70–99)
Glucose-Capillary: 118 mg/dL — ABNORMAL HIGH (ref 70–99)
Glucose-Capillary: 146 mg/dL — ABNORMAL HIGH (ref 70–99)
Glucose-Capillary: 269 mg/dL — ABNORMAL HIGH (ref 70–99)
Glucose-Capillary: 207 mg/dL — ABNORMAL HIGH (ref 70–99)

## 2018-04-29 LAB — PROTIME-INR
INR: 0.9 (ref 0.8–1.2)
Prothrombin Time: 12.4 seconds (ref 11.4–15.2)

## 2018-04-29 SURGERY — POSTERIOR LUMBAR FUSION 1 WITH HARDWARE REMOVAL
Anesthesia: General | Site: Spine Lumbar

## 2018-04-29 MED ORDER — INSULIN ASPART 100 UNIT/ML ~~LOC~~ SOLN
0.0000 [IU] | Freq: Every day | SUBCUTANEOUS | Status: DC
  Administered 2018-04-29: 2 [IU] via SUBCUTANEOUS

## 2018-04-29 MED ORDER — ONDANSETRON HCL 4 MG/2ML IJ SOLN
INTRAMUSCULAR | Status: AC
  Administered 2018-04-29: 4 mg via INTRAVENOUS
  Filled 2018-04-29: qty 2

## 2018-04-29 MED ORDER — DEXAMETHASONE SODIUM PHOSPHATE 10 MG/ML IJ SOLN
INTRAMUSCULAR | Status: AC
  Filled 2018-04-29: qty 1

## 2018-04-29 MED ORDER — LIDOCAINE 2% (20 MG/ML) 5 ML SYRINGE
INTRAMUSCULAR | Status: AC
  Filled 2018-04-29: qty 5

## 2018-04-29 MED ORDER — PHENOL 1.4 % MT LIQD: 1.0000 | OROMUCOSAL | Status: DC | PRN

## 2018-04-29 MED ORDER — ALUM & MAG HYDROXIDE-SIMETH 200-200-20 MG/5ML PO SUSP: 30.0000 mL | Freq: Four times a day (QID) | ORAL | Status: DC | PRN

## 2018-04-29 MED ORDER — FENTANYL CITRATE (PF) 100 MCG/2ML IJ SOLN
INTRAMUSCULAR | Status: DC | PRN
  Administered 2018-04-29 (×2): 50 ug via INTRAVENOUS
  Administered 2018-04-29: 150 ug via INTRAVENOUS
  Administered 2018-04-29 (×2): 50 ug via INTRAVENOUS

## 2018-04-29 MED ORDER — OXYCODONE HCL 5 MG PO TABS
10.0000 mg | ORAL_TABLET | ORAL | Status: DC | PRN
  Administered 2018-04-29 (×2): 10 mg via ORAL
  Filled 2018-04-29: qty 2

## 2018-04-29 MED ORDER — MEPERIDINE HCL 50 MG/ML IJ SOLN: 6.2500 mg | INTRAMUSCULAR | Status: DC | PRN

## 2018-04-29 MED ORDER — CYCLOBENZAPRINE HCL 10 MG PO TABS
ORAL_TABLET | ORAL | Status: AC
  Administered 2018-04-29: 10 mg via ORAL
  Filled 2018-04-29: qty 1

## 2018-04-29 MED ORDER — FENTANYL CITRATE (PF) 250 MCG/5ML IJ SOLN
INTRAMUSCULAR | Status: AC
  Filled 2018-04-29: qty 5

## 2018-04-29 MED ORDER — LACTATED RINGERS IV SOLN
INTRAVENOUS | Status: DC | PRN
  Administered 2018-04-29 (×2): via INTRAVENOUS

## 2018-04-29 MED ORDER — CHLORHEXIDINE GLUCONATE CLOTH 2 % EX PADS: 6.0000 | MEDICATED_PAD | Freq: Once | CUTANEOUS | Status: DC

## 2018-04-29 MED ORDER — HYDROMORPHONE HCL 1 MG/ML IJ SOLN: 1.0000 mg | INTRAMUSCULAR | Status: DC | PRN

## 2018-04-29 MED ORDER — OXYCODONE HCL 5 MG PO TABS
ORAL_TABLET | ORAL | Status: AC
  Administered 2018-04-29: 10 mg via ORAL
  Filled 2018-04-29: qty 2

## 2018-04-29 MED ORDER — SUGAMMADEX SODIUM 200 MG/2ML IV SOLN
INTRAVENOUS | Status: DC | PRN
  Administered 2018-04-29: 200 mg via INTRAVENOUS

## 2018-04-29 MED ORDER — BUPIVACAINE LIPOSOME 1.3 % IJ SUSP
20.0000 mL | INTRAMUSCULAR | Status: AC
  Administered 2018-04-29: 20 mL
  Filled 2018-04-29: qty 20

## 2018-04-29 MED ORDER — ROCURONIUM BROMIDE 50 MG/5ML IV SOSY
PREFILLED_SYRINGE | INTRAVENOUS | Status: DC | PRN
  Administered 2018-04-29: 10 mg via INTRAVENOUS
  Administered 2018-04-29: 90 mg via INTRAVENOUS
  Administered 2018-04-29: 5 mg via INTRAVENOUS

## 2018-04-29 MED ORDER — 0.9 % SODIUM CHLORIDE (POUR BTL) OPTIME
TOPICAL | Status: DC | PRN
  Administered 2018-04-29: 1000 mL

## 2018-04-29 MED ORDER — PHENYLEPHRINE HCL 10 MG/ML IJ SOLN
INTRAMUSCULAR | Status: DC | PRN
  Administered 2018-04-29: 80 ug via INTRAVENOUS
  Administered 2018-04-29 (×2): 40 ug via INTRAVENOUS

## 2018-04-29 MED ORDER — ROCURONIUM BROMIDE 50 MG/5ML IV SOSY
PREFILLED_SYRINGE | INTRAVENOUS | Status: AC
  Filled 2018-04-29: qty 5

## 2018-04-29 MED ORDER — PANTOPRAZOLE SODIUM 40 MG PO TBEC
40.0000 mg | DELAYED_RELEASE_TABLET | Freq: Every day | ORAL | Status: DC
  Administered 2018-04-30: 40 mg via ORAL
  Filled 2018-04-29: qty 1

## 2018-04-29 MED ORDER — MIDAZOLAM HCL 2 MG/2ML IJ SOLN
INTRAMUSCULAR | Status: AC
  Filled 2018-04-29: qty 2

## 2018-04-29 MED ORDER — ACETAMINOPHEN 650 MG RE SUPP: 650.0000 mg | RECTAL | Status: DC | PRN

## 2018-04-29 MED ORDER — ONDANSETRON HCL 4 MG PO TABS: 4.0000 mg | ORAL_TABLET | Freq: Four times a day (QID) | ORAL | Status: DC | PRN

## 2018-04-29 MED ORDER — ONDANSETRON HCL 4 MG/2ML IJ SOLN: 4.0000 mg | Freq: Four times a day (QID) | INTRAMUSCULAR | Status: DC | PRN

## 2018-04-29 MED ORDER — ARTIFICIAL TEARS OPHTHALMIC OINT
TOPICAL_OINTMENT | OPHTHALMIC | Status: DC | PRN
  Administered 2018-04-29: 1 via OPHTHALMIC

## 2018-04-29 MED ORDER — PROPOFOL 10 MG/ML IV BOLUS
INTRAVENOUS | Status: AC
  Filled 2018-04-29: qty 20

## 2018-04-29 MED ORDER — ACETAMINOPHEN 10 MG/ML IV SOLN
1000.0000 mg | Freq: Four times a day (QID) | INTRAVENOUS | Status: DC
  Administered 2018-04-29: 1000 mg via INTRAVENOUS

## 2018-04-29 MED ORDER — PANTOPRAZOLE SODIUM 40 MG IV SOLR: 40.0000 mg | Freq: Every day | INTRAVENOUS | Status: DC

## 2018-04-29 MED ORDER — MENTHOL 3 MG MT LOZG: 1.0000 | LOZENGE | OROMUCOSAL | Status: DC | PRN

## 2018-04-29 MED ORDER — ONDANSETRON HCL 4 MG/2ML IJ SOLN
INTRAMUSCULAR | Status: AC
  Filled 2018-04-29: qty 2

## 2018-04-29 MED ORDER — ACETAMINOPHEN 325 MG PO TABS: 650.0000 mg | ORAL_TABLET | ORAL | Status: DC | PRN

## 2018-04-29 MED ORDER — METOPROLOL SUCCINATE ER 25 MG PO TB24
25.0000 mg | ORAL_TABLET | Freq: Two times a day (BID) | ORAL | Status: DC
  Administered 2018-04-29: 25 mg via ORAL
  Filled 2018-04-29: qty 1

## 2018-04-29 MED ORDER — METFORMIN HCL ER 500 MG PO TB24
1000.0000 mg | ORAL_TABLET | Freq: Two times a day (BID) | ORAL | Status: DC
  Administered 2018-04-29 (×2): 1000 mg via ORAL
  Filled 2018-04-29 (×2): qty 2

## 2018-04-29 MED ORDER — PROPOFOL 10 MG/ML IV BOLUS
INTRAVENOUS | Status: DC | PRN
  Administered 2018-04-29: 120 mg via INTRAVENOUS

## 2018-04-29 MED ORDER — THROMBIN 20000 UNITS EX SOLR
CUTANEOUS | Status: DC | PRN
  Administered 2018-04-29: 20 mL via TOPICAL

## 2018-04-29 MED ORDER — DEXAMETHASONE SODIUM PHOSPHATE 10 MG/ML IJ SOLN
10.0000 mg | INTRAMUSCULAR | Status: AC
  Administered 2018-04-29: 10 mg via INTRAVENOUS
  Filled 2018-04-29: qty 1

## 2018-04-29 MED ORDER — BENAZEPRIL HCL 40 MG PO TABS
40.0000 mg | ORAL_TABLET | Freq: Every evening | ORAL | Status: DC
  Administered 2018-04-29: 40 mg via ORAL
  Filled 2018-04-29 (×2): qty 1

## 2018-04-29 MED ORDER — SODIUM CHLORIDE 0.9% FLUSH
3.0000 mL | Freq: Two times a day (BID) | INTRAVENOUS | Status: DC
  Administered 2018-04-29: 3 mL via INTRAVENOUS

## 2018-04-29 MED ORDER — HYDROMORPHONE HCL 1 MG/ML IJ SOLN: 0.2500 mg | INTRAMUSCULAR | Status: DC | PRN

## 2018-04-29 MED ORDER — ONDANSETRON HCL 4 MG/2ML IJ SOLN
INTRAMUSCULAR | Status: DC | PRN
  Administered 2018-04-29: 4 mg via INTRAVENOUS

## 2018-04-29 MED ORDER — VANCOMYCIN HCL IN DEXTROSE 1-5 GM/200ML-% IV SOLN
1000.0000 mg | Freq: Two times a day (BID) | INTRAVENOUS | Status: DC
  Administered 2018-04-29 – 2018-04-30 (×2): 1000 mg via INTRAVENOUS
  Filled 2018-04-29 (×2): qty 200

## 2018-04-29 MED ORDER — ONDANSETRON HCL 4 MG/2ML IJ SOLN
4.0000 mg | Freq: Once | INTRAMUSCULAR | Status: AC | PRN
  Administered 2018-04-29: 4 mg via INTRAVENOUS

## 2018-04-29 MED ORDER — SODIUM CHLORIDE 0.9 % IV SOLN: 250.0000 mL | INTRAVENOUS | Status: DC

## 2018-04-29 MED ORDER — SODIUM CHLORIDE 0.9 % IV SOLN
INTRAVENOUS | Status: DC | PRN
  Administered 2018-04-29: 20 ug/min via INTRAVENOUS

## 2018-04-29 MED ORDER — INSULIN ASPART 100 UNIT/ML ~~LOC~~ SOLN
0.0000 [IU] | Freq: Three times a day (TID) | SUBCUTANEOUS | Status: DC
  Administered 2018-04-30: 3 [IU] via SUBCUTANEOUS

## 2018-04-29 MED ORDER — PHENYLEPHRINE 40 MCG/ML (10ML) SYRINGE FOR IV PUSH (FOR BLOOD PRESSURE SUPPORT)
PREFILLED_SYRINGE | INTRAVENOUS | Status: AC
  Filled 2018-04-29: qty 10

## 2018-04-29 MED ORDER — AMLODIPINE BESYLATE 5 MG PO TABS: 5.0000 mg | ORAL_TABLET | Freq: Every day | ORAL | Status: DC

## 2018-04-29 MED ORDER — SODIUM CHLORIDE 0.9% FLUSH: 3.0000 mL | INTRAVENOUS | Status: DC | PRN

## 2018-04-29 MED ORDER — CEFAZOLIN SODIUM-DEXTROSE 2-4 GM/100ML-% IV SOLN
2.0000 g | INTRAVENOUS | Status: AC
  Administered 2018-04-29: 2 g via INTRAVENOUS
  Filled 2018-04-29: qty 100

## 2018-04-29 MED ORDER — PROPAFENONE HCL ER 225 MG PO CP12
450.0000 mg | ORAL_CAPSULE | Freq: Two times a day (BID) | ORAL | Status: DC
  Filled 2018-04-29 (×2): qty 2

## 2018-04-29 MED ORDER — FINASTERIDE 5 MG PO TABS
5.0000 mg | ORAL_TABLET | Freq: Every evening | ORAL | Status: DC
  Administered 2018-04-29: 5 mg via ORAL
  Filled 2018-04-29: qty 1

## 2018-04-29 MED ORDER — LIDOCAINE 2% (20 MG/ML) 5 ML SYRINGE
INTRAMUSCULAR | Status: DC | PRN
  Administered 2018-04-29: 100 mg via INTRAVENOUS

## 2018-04-29 MED ORDER — LIDOCAINE-EPINEPHRINE 1 %-1:100000 IJ SOLN
INTRAMUSCULAR | Status: AC
  Filled 2018-04-29: qty 1

## 2018-04-29 MED ORDER — THROMBIN 20000 UNITS EX SOLR
CUTANEOUS | Status: AC
  Filled 2018-04-29: qty 20000

## 2018-04-29 MED ORDER — SODIUM CHLORIDE 0.9 % IV SOLN
INTRAVENOUS | Status: DC | PRN
  Administered 2018-04-29: 500 mL

## 2018-04-29 MED ORDER — LIDOCAINE-EPINEPHRINE 1 %-1:100000 IJ SOLN
INTRAMUSCULAR | Status: DC | PRN
  Administered 2018-04-29: 10 mL

## 2018-04-29 MED ORDER — CYCLOBENZAPRINE HCL 10 MG PO TABS
10.0000 mg | ORAL_TABLET | Freq: Three times a day (TID) | ORAL | Status: DC | PRN
  Administered 2018-04-29: 10 mg via ORAL

## 2018-04-29 SURGICAL SUPPLY — 73 items
BAG DECANTER FOR FLEXI CONT (MISCELLANEOUS) ×2 IMPLANT
BENZOIN TINCTURE PRP APPL 2/3 (GAUZE/BANDAGES/DRESSINGS) ×2 IMPLANT
BIT DRILL 5.0/4.0 (BIT) ×1 IMPLANT
BLADE CLIPPER SURG (BLADE) ×2 IMPLANT
BLADE SURG 11 STRL SS (BLADE) ×2 IMPLANT
BONE VIVIGEN FORMABLE 5.4CC (Bone Implant) ×2 IMPLANT
BUR CUTTER 7.0 ROUND (BURR) ×2 IMPLANT
BUR MATCHSTICK NEURO 3.0 LAGG (BURR) ×2 IMPLANT
CANISTER SUCT 3000ML PPV (MISCELLANEOUS) ×2 IMPLANT
CAP LOCKING (Cap) ×9 IMPLANT
CAP LOCKING 5.5 CREO (Cap) ×9 IMPLANT
CARTRIDGE OIL MAESTRO DRILL (MISCELLANEOUS) ×1 IMPLANT
CONT SPEC 4OZ CLIKSEAL STRL BL (MISCELLANEOUS) ×4 IMPLANT
COVER BACK TABLE 60X90IN (DRAPES) ×2 IMPLANT
DERMABOND ADVANCED (GAUZE/BANDAGES/DRESSINGS) ×1
DERMABOND ADVANCED .7 DNX12 (GAUZE/BANDAGES/DRESSINGS) ×1 IMPLANT
DIFFUSER DRILL AIR PNEUMATIC (MISCELLANEOUS) ×2 IMPLANT
DRAPE C-ARM 42X72 X-RAY (DRAPES) ×4 IMPLANT
DRAPE HALF SHEET 40X57 (DRAPES) IMPLANT
DRAPE LAPAROTOMY 100X72X124 (DRAPES) ×2 IMPLANT
DRAPE SURG 17X23 STRL (DRAPES) ×2 IMPLANT
DRILL 5.0/4.0 (BIT) ×2
DRSG OPSITE 4X5.5 SM (GAUZE/BANDAGES/DRESSINGS) ×2 IMPLANT
DRSG OPSITE POSTOP 4X6 (GAUZE/BANDAGES/DRESSINGS) ×2 IMPLANT
DURAPREP 26ML APPLICATOR (WOUND CARE) ×2 IMPLANT
ELECT REM PT RETURN 9FT ADLT (ELECTROSURGICAL) ×2
ELECTRODE REM PT RTRN 9FT ADLT (ELECTROSURGICAL) ×1 IMPLANT
EVACUATOR 1/8 PVC DRAIN (DRAIN) ×2 IMPLANT
GAUZE 4X4 16PLY RFD (DISPOSABLE) IMPLANT
GAUZE SPONGE 4X4 12PLY STRL (GAUZE/BANDAGES/DRESSINGS) ×2 IMPLANT
GAUZE SPONGE 4X4 12PLY STRL LF (GAUZE/BANDAGES/DRESSINGS) ×2 IMPLANT
GLOVE BIO SURGEON STRL SZ7 (GLOVE) ×4 IMPLANT
GLOVE BIO SURGEON STRL SZ8 (GLOVE) ×4 IMPLANT
GLOVE BIOGEL PI IND STRL 7.0 (GLOVE) ×2 IMPLANT
GLOVE BIOGEL PI IND STRL 7.5 (GLOVE) ×3 IMPLANT
GLOVE BIOGEL PI INDICATOR 7.0 (GLOVE) ×2
GLOVE BIOGEL PI INDICATOR 7.5 (GLOVE) ×3
GLOVE INDICATOR 8.5 STRL (GLOVE) ×4 IMPLANT
GLOVE SS N UNI LF 6.5 STRL (GLOVE) ×6 IMPLANT
GLOVE SS N UNI LF 7.0 STRL (GLOVE) ×6 IMPLANT
GOWN STRL REUS W/ TWL LRG LVL3 (GOWN DISPOSABLE) ×2 IMPLANT
GOWN STRL REUS W/ TWL XL LVL3 (GOWN DISPOSABLE) ×3 IMPLANT
GOWN STRL REUS W/TWL 2XL LVL3 (GOWN DISPOSABLE) IMPLANT
GOWN STRL REUS W/TWL LRG LVL3 (GOWN DISPOSABLE) ×2
GOWN STRL REUS W/TWL XL LVL3 (GOWN DISPOSABLE) ×3
IMPL SPINE MCS 6.5 5.5X35 (Neuro Prosthesis/Implant) ×2 IMPLANT
IMPLANT SPINE MCS 6.5 5.5X35 (Neuro Prosthesis/Implant) ×4 IMPLANT
KIT BASIN OR (CUSTOM PROCEDURE TRAY) ×2 IMPLANT
KIT INFUSE XX SMALL 0.7CC (Orthopedic Implant) ×2 IMPLANT
KIT TURNOVER KIT B (KITS) ×2 IMPLANT
MILL MEDIUM DISP (BLADE) ×2 IMPLANT
NEEDLE HYPO 21X1.5 SAFETY (NEEDLE) ×2 IMPLANT
NEEDLE HYPO 25X1 1.5 SAFETY (NEEDLE) ×2 IMPLANT
NS IRRIG 1000ML POUR BTL (IV SOLUTION) ×2 IMPLANT
OIL CARTRIDGE MAESTRO DRILL (MISCELLANEOUS) ×2
PACK LAMINECTOMY NEURO (CUSTOM PROCEDURE TRAY) ×2 IMPLANT
PAD ARMBOARD 7.5X6 YLW CONV (MISCELLANEOUS) ×6 IMPLANT
ROD CREO STRAIGHT 125MM (Rod) ×4 IMPLANT
SPACER SUSTAIN RT 13 15D 10X26 (Spacer) ×4 IMPLANT
SPONGE LAP 4X18 RFD (DISPOSABLE) IMPLANT
SPONGE SURGIFOAM ABS GEL 100 (HEMOSTASIS) ×2 IMPLANT
STRIP CLOSURE SKIN 1/2X4 (GAUZE/BANDAGES/DRESSINGS) ×2 IMPLANT
SUT VIC AB 0 CT1 18XCR BRD8 (SUTURE) ×2 IMPLANT
SUT VIC AB 0 CT1 8-18 (SUTURE) ×2
SUT VIC AB 2-0 CT1 18 (SUTURE) ×2 IMPLANT
SUT VIC AB 4-0 PS2 27 (SUTURE) ×2 IMPLANT
SYR 20CC LL (SYRINGE) ×2 IMPLANT
TAPE CLOTH SURG 4X10 WHT LF (GAUZE/BANDAGES/DRESSINGS) ×2 IMPLANT
TOWEL GREEN STERILE (TOWEL DISPOSABLE) ×2 IMPLANT
TOWEL GREEN STERILE FF (TOWEL DISPOSABLE) ×2 IMPLANT
TRAY FOLEY MTR SLVR 16FR STAT (SET/KITS/TRAYS/PACK) ×2 IMPLANT
TULIP CREP AMP 5.5MM (Orthopedic Implant) ×4 IMPLANT
WATER STERILE IRR 1000ML POUR (IV SOLUTION) ×2 IMPLANT

## 2018-04-29 NOTE — Evaluation (Signed)
Physical Therapy Evaluation Patient Details Name: Sean Reyes. MRN: [id number] DOB: 15-Jun-1940 Today's Date: 04/29/2018   History of Present Illness  Patient is a 78 y/o male who presents s/p L5-S1 PLIF. PMH includes bil hip sx, back surgery, HTN, DM, A-fib.  Clinical Impression  Patient presents with pain and post surgical deficits s/p above surgery. Tolerated transfers and gait training with supervision-Min A for balance/safety. Pt reports feeling "loopy" from anesthesia. Anticipate when this wears off, pt's balance and mobility will improve. Pt independent PTA, living with spouse and working as a Chief Strategy Officer. Education re: back precautions, positioning, log roll technique, exercise recommendations, brace etc. Will need to do stairs tomorrow. Will follow acutely to maximize independence and mobility prior to return home.    Follow Up Recommendations No PT follow up;Supervision for mobility/OOB    Equipment Recommendations  None recommended by PT    Recommendations for Other Services       Precautions / Restrictions Precautions Precautions: Back Precaution Booklet Issued: Yes (comment) Precaution Comments: Reviewed handout and precautions Required Braces or Orthoses: Spinal Brace Spinal Brace: Lumbar corset;Applied in sitting position Restrictions Weight Bearing Restrictions: No      Mobility  Bed Mobility Overal bed mobility: Needs Assistance Bed Mobility: Rolling;Sidelying to Sit;Sit to Sidelying Rolling: Supervision Sidelying to sit: Supervision;HOB elevated     Sit to sidelying: Mod assist;HOB elevated General bed mobility comments: Cues for log roll technique, use of rail for support. HOB elevated ~20 degrees. Assist to bring LEs into bed.   Transfers Overall transfer level: Needs assistance Equipment used: None Transfers: Sit to/from Stand Sit to Stand: Min guard         General transfer comment: Min guard for safety. Stood from [employer].  Ambulation/Gait Ambulation/Gait assistance: Min assist;Min guard Gait Distance (Feet): 120 Feet Assistive device: None Gait Pattern/deviations: Step-through pattern;Decreased stride length;Shuffle Gait velocity: decreased   General Gait Details: Slow, guarded gait holding onto IV pole for support. Min A at times for balance.   Stairs            Wheelchair Mobility    Modified Rankin (Stroke Patients Only)       Balance Overall balance assessment: Needs assistance Sitting-balance support: Feet supported;No upper extremity supported Sitting balance-Leahy Scale: Good Sitting balance - Comments: Able to donn brace with setup.   Standing balance support: During functional activity Standing balance-Leahy Scale: Fair Standing balance comment: Able to donn underwear in standing with min guard assist.                              Pertinent Vitals/Pain Pain Assessment: 0-10 Pain Score: 5  Pain Location: back Pain Descriptors / Indicators: Sore;Operative site guarding Pain Intervention(s): Monitored during session;Repositioned    Home Living Family/patient expects to be discharged to:: Private residence Living Arrangements: Spouse/significant other Available Help at Discharge: Family;Available 24 hours/day Type of Home: House Home Access: Stairs to enter Entrance Stairs-Rails: Right;Left;Can reach both Entrance Stairs-Number of Steps: 2 Home Layout: One level Home Equipment: Shower seat - built in;Walker - 2 wheels;Hand held shower head;Grab bars - tub/shower      Prior Function Level of Independence: Independent         Comments: Works as Proofreader work, drives, 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      Hand Dominance   Dominant Hand: Right    Extremity/Trunk Assessment   Upper Extremity Assessment Upper Extremity Assessment:  Defer to OT evaluation    Lower Extremity Assessment Lower Extremity Assessment: Generalized  weakness(Grossly ~4/5 throughout)    Cervical / Trunk Assessment Cervical / Trunk Assessment: Other exceptions Cervical / Trunk Exceptions: s/p back surgery  Communication   Communication: No difficulties  Cognition Arousal/Alertness: Lethargic;Suspect due to medications Behavior During Therapy: [facility] for tasks assessed/performed Overall Cognitive Status: Within Functional Limits for tasks assessed                                 General Comments: Pt reports feeling a little loopy from the anesthesia.       General Comments General comments (skin integrity, edema, etc.): Wife and daughter present.    Exercises     Assessment/Plan    PT Assessment Patient needs continued PT services  PT Problem List Decreased strength;Decreased balance;Pain;Decreased knowledge of precautions;Decreased skin integrity       PT Treatment Interventions Functional mobility training;Balance training;Patient/family education;Gait training;Therapeutic activities;Stair training;Therapeutic exercise    PT Goals (Current goals can be found in the Care Plan section)  Acute Rehab PT Goals Patient Stated Goal: to walk without pain PT Goal Formulation: With patient Time For Goal Achievement: 05/13/18 Potential to Achieve Goals: Good    Frequency Min 5X/week   Barriers to discharge        Co-evaluation               AM-PAC PT "6 Clicks" Mobility  Outcome Measure Help needed turning from your back to your side while in a flat bed without using bedrails?: A Little Help needed moving from lying on your back to sitting on the side of a flat bed without using bedrails?: A Little Help needed moving to and from a bed to a chair (including a wheelchair)?: A Little Help needed standing up from a chair using your arms (e.g., wheelchair or bedside chair)?: A Little Help needed to walk in hospital room?: A Little Help needed climbing 3-5 steps with a railing? : A Little 6 Click Score:  18    End of Session Equipment Utilized During Treatment: Gait belt;Back brace Activity Tolerance: Patient tolerated treatment well Patient left: in bed;with call bell/phone within reach;with family/visitor present;with SCD's reapplied Nurse Communication: Mobility status PT Visit Diagnosis: Pain;Muscle weakness (generalized) (M62.81);Difficulty in walking, not elsewhere classified (R26.2) Pain - part of body: (back)    Time: 9201-0071 PT Time Calculation (min) (ACUTE ONLY): 33 min   Charges:   PT Evaluation $PT Eval Low Complexity: 1 Low PT Treatments $Gait Training: 8-22 mins        Wray Kearns, PT, DPT Acute Rehabilitation Services Pager [phone] Office [place] 04/29/2018, 4:16 PM

## 2018-04-29 NOTE — Progress Notes (Signed)
Pharmacy Antibiotic Note  Sean Reyes. is a 78 y.o. male admitted on 04/29/2018 s/p decompressive lumbar laminectomy L5-S1 .  Pharmacy has been consulted for Vancomycin dosing. Patient is noted to have closed system drain on nurse charting.   No pre-op dose of vancomycin noted in MAR. Will start Vancomycin 8 hours from pre-op cefazolin dose given ~0830.   Plan: Vancomycin 1000mg  IV q12h Monitor for de-escalation, and renal function     Temp (24hrs), Avg:98 F (36.7 C), Min:97 F (36.1 C), Max:98.8 F (37.1 C)  No results for input(s): WBC, CREATININE, LATICACIDVEN, VANCOTROUGH, VANCOPEAK, VANCORANDOM, GENTTROUGH, GENTPEAK, GENTRANDOM, TOBRATROUGH, TOBRAPEAK, TOBRARND, AMIKACINPEAK, AMIKACINTROU, AMIKACIN in the last 168 hours.  Estimated Creatinine Clearance: 62.7 mL/min (by C-G formula based on SCr of 1.15 mg/dL).    Allergies  Allergen Reactions  . Levaquin [Levofloxacin In D5w] Anaphylaxis and Shortness Of Breath  . Shellfish Allergy Anaphylaxis    Has used duraprep, betadine and ioban in previous surgeries in 2019 and 2018 without issue  . Amiodarone Other (See Comments)    Tremors and thyroid toxicity Other reaction(s): Other Tremors and thyroid toxicity Tremors and thyroid toxicity  . Adhesive [Tape] Other (See Comments)    Little red bumps under the dressing.  He questions whether is latex related    Tamirra Sienkiewicz A. Levada Dy, PharmD, [place] Please utilize Amion for appropriate phone number to reach the unit pharmacist ([place])   04/29/2018 1:05 PM

## 2018-04-29 NOTE — Anesthesia Postprocedure Evaluation (Signed)
Anesthesia Post Note  Patient: Sean Reyes.  Procedure(s) Performed: LUMBAR FIVE-SACRAL ONE POSTERIOR LUMBAR INTERBODY FUSION WITH EXTENSION OF FUSION (N/A Spine Lumbar)     Patient location during evaluation: PACU Anesthesia Type: General Level of consciousness: awake and alert Pain management: pain level controlled Vital Signs Assessment: post-procedure vital signs reviewed and stable Respiratory status: spontaneous breathing, nonlabored ventilation, respiratory function stable and patient connected to nasal cannula oxygen Cardiovascular status: blood pressure returned to baseline and stable Postop Assessment: no apparent nausea or vomiting Anesthetic complications: no    Last Vitals:  Vitals:   04/29/18 1235 04/29/18 1312  BP: 111/68 134/76  Pulse: 61 65  Resp: (!) 7 18  Temp:  36.5 C  SpO2: 94% 96%    Last Pain:  Vitals:   04/29/18 1312  TempSrc: Oral  PainSc:                  Sean Reyes DAVID

## 2018-04-29 NOTE — Progress Notes (Signed)
Orthopedic Tech Progress Note Patient Details:  Sean Reyes [birth year] [id number]  Patient ID: [name]., male   DOB: 08-13-1940, 78 y.o.   MRN: [id number]   [place] Pink 04/29/2018, 12:13 PMCalled [employer] for [employer].

## 2018-04-29 NOTE — Op Note (Signed)
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  Procedure: #1 decompressive lumbar laminectomy L5-S1 in excess and requiring more work than would be needed with a standard interbody fusion with complete medial facetectomies and radical foraminotomies of the L5 and S1 nerve roots  #2 posterior lumbar interbody fusion L5-S1 utilizing the globus sustain inserted rotate TPS coated peek cages packed with locally harvested autograft mixed with vivigen and BMP  #3 exploration of fusion removal of hardware L2-L5 with removal of bilateral rods and all connecting nuts with retention of all screws except removal of loose right L5 screw  #4 redo posterior lateral arthrodesis L4-5 and posterior lateral arthrodesis L5-S1 utilizing the locally harvested autograft mix with BMP and vivigen  Surgeon: Dominica Severin Willo Yoon  Asst.: Nash Shearer  Anesthesia: Gen.  EBL: Minimal  History of present illness: 78 year old gentleman with progressive worsening back bilateral hip and leg pain worse on the left workup revealed large disc herniation L5-S1 below the level of a previous L2-L5 fusion. Due to patient's failure conservative treatment imaging findings and progression of clinical syndrome I recommended decompressive laminectomy and interbody fusion L5-S1 I extensively went over the risks and benefits of that operation with him as well as perioperative course expectations of outcome and alternatives to surgery and he understood and agreed to proceed forward.  Operative procedure: Patient brought into the or was induced on general anesthesia positioned prone on the Wilson frame his back was prepped and draped in routine sterile fashion is old incision was opened up after infiltration of 10 mL lidocaine with epi and extended inferiorly. Subperiosteal dissections care lamina of L5 and S1 and exposed the  hardware from L2-L5. Disconnected the hardware remove the rods and nuts then removed the spinous process at L5 and drilled to the facet joints. Central decompression was begun and complete medial facetectomies were performed. There was marked hourglass compression of thecal sac with a lot of bone spur primarily on the left causing severe thecal sac compression. After all the foramina were unroofed the completely facetectomies were performed and aggressively under biting the superior tickling process of S1 to gain access to the lateral aspect the disc space. Then disc spaces were incised sequentially distracted up with an 11 distractor and then I selected a 10 mm 13 mm lordotic 15 cage packed with locally harvested autograft mixed and inserted and rotated under fluoroscopy. Then an extensive amount of bone graft material was packed centrally and inserted the contralateral cage and then packed BMP and bone graft material and lateral to the cages. Then 2 cortical screws were placed at S1 all screws excellent purchase posterior fluoroscopy confirmed good position of all the implants. All the foraminal reinspected confirm patency no migration of graft material. I aggressively decorticated the TPs lateral facet joints from L4 down to S1. Placed the remainder of the locally harvested autograft mixed posterior laterally from L4-S1 then connected of all the rods tightened down all the knots and reinspected the foramen laid Gelfoam over the dura injected X Barela in the fascia and closed the wound in layers with interrupted Vicryl and a running 4 subcuticular Dermabond benzo and Steri-Strips and sterile dressing was applied patient recovered in stable condition. At the end the case all needle counts sponge counts were correct.

## 2018-04-29 NOTE — Transfer of Care (Signed)
Immediate Anesthesia Transfer of Care Note  Patient: Sean Reyes.  Procedure(s) Performed: POSTERIOR LUMBAR FUSION 1 WITH HARDWARE REMOVAL REMOVAL LUMBAR TWO THOUGH LUMBAR FIVE HARDWARE (N/A Spine Lumbar)  Patient Location: PACU  Anesthesia Type:General  Level of Consciousness: awake, alert  and oriented  Airway & Oxygen Therapy: Patient connected to nasal cannula oxygen  Post-op Assessment: Post -op Vital signs reviewed and stable  Post vital signs: stable  Last Vitals:  Vitals Value Taken Time  BP    Temp    Pulse    Resp    SpO2      Last Pain:  Vitals:   04/29/18 0605  TempSrc: Oral  PainSc:          Complications: No apparent anesthesia complications

## 2018-04-29 NOTE — Plan of Care (Signed)
  Problem: Education: Goal: Ability to verbalize activity precautions or restrictions will improve Outcome: Progressing Goal: Knowledge of the prescribed therapeutic regimen will improve Outcome: Progressing Goal: Understanding of discharge needs will improve Outcome: Progressing   Problem: Activity: Goal: Ability to avoid complications of mobility impairment will improve Outcome: Progressing Goal: Ability to tolerate increased activity will improve Outcome: Progressing Goal: Will remain free from falls Outcome: Progressing   Problem: Pain Management: Goal: Pain level will decrease Outcome: Progressing   Problem: Safety: Goal: Ability to remain free from injury will improve Outcome: Progressing

## 2018-04-29 NOTE — H&P (Signed)
Sean Reyes. is an 78 y.o. male.   Chief Complaint: back and left leg pain HPI: 78 year old gentleman with long-standing back and left greater right leg pain workup revealed a large disc herniation L5-S1 below the level of a previous L2-L5 fusion. Patient failed all forms of conservative treatment and due to his progressive clinical syndrome imaging findings and failure conservative treatment I recommended extension of his fusion decompression discectomy and interbody fusion L5-S1. I've extensively gone over the risks and benefits of that operation with him as well as perioperative course expectations of outcome and alternatives to surgery and he understands and agrees to proceed forward.  Past Medical History:  Diagnosis Date  . Anemia   . Anxiety   . Arthritis   . Atrial fibrillation ([place])   . Cataracts, bilateral   . Complication of anesthesia    pt reports low BP's after surgery at [facility] and difficulty awakening  . Depression   . Diabetes ([place])    dx 6-8 yrs ago  . Dysrhythmia    a-fib  . GERD (gastroesophageal reflux disease)    OCC TAKES ALKA SELTZER  . History of kidney stones    10-15 yrs ago  . 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 Hyperlipidemia   . Hypertension   . Nocturia   . S/P ablation of atrial fibrillation    Ablative therapy  . Sleep apnea    CPAP   . Tachycardia, unspecified     Past Surgical History:  Procedure Laterality Date  . ABLATION    . ANTERIOR LAT LUMBAR FUSION N/A 06/27/2017   Procedure: Anterior Lateral Lumbar Interbody  Fusion - Lumbar Two-Lumbar Three - Lumbar Three-Lumbar Four, Posterior Lumbar Interbody Fusion Lumbar Four- Five;  Surgeon: Sean Kos, MD;  Location: [place];  Service: Neurosurgery;  Laterality: N/A;  Anterior Lateral Lumbar Interbody  Fusion - Lumbar Two-Lumbar Three - Lumbar Three-Lumbar Four, Posterior Lumbar Interbody Fusion Lumbar Four- Five  . BACK SURGERY    . COLONOSCOPY WITH PROPOFOL N/A  10/05/2015   Procedure: COLONOSCOPY WITH PROPOFOL;  Surgeon: Sean Sails, MD;  [facility] END[facility]COPY;  Service: Endoscopy;  Laterality: N/A;  . ESOPHAGOGASTRODUODENOSCOPY (EGD) WITH PROPOFOL N/A 04/01/2018   Procedure: ESOPHAGOGASTRODUODENOSCOPY (EGD) WITH PROPOFOL;  Surgeon: Sean Sails, MD;  Location: [facility] ENDOSCOPY;  Service: Endoscopy;  Laterality: N/A;  . HERNIA REPAIR    . JOINT REPLACEMENT Bilateral    hips  RT+  LEFT X2   . LUMBAR LAMINECTOMY/DECOMPRESSION MICRODISCECTOMY Left 09/13/2016   Procedure: Microdiscectomy - Lumbar two-three,  Lumbar three- - left;  Surgeon: Sean Kos, MD;  Location: [place];  Service: Neurosurgery;  Laterality: Left;  . TONSILLECTOMY      Family History  Problem Relation Age of Onset  . Brain cancer Mother   . Kidney disease Neg Hx   . Prostate cancer Neg Hx   . Kidney cancer Neg Hx   . Bladder Cancer Neg Hx    Social History:  reports that he has never smoked. He has never used smokeless tobacco. He reports that he does not drink alcohol or use drugs.  Allergies:  Allergies  Allergen Reactions  . Levaquin [Levofloxacin In D5w] Anaphylaxis and Shortness Of Breath  . Shellfish Allergy Anaphylaxis  . Amiodarone Other (See Comments)    Tremors and thyroid toxicity Other reaction(s): Other Tremors and thyroid toxicity Tremors and thyroid toxicity  . Adhesive [Tape] Other (See Comments)    Little red bumps  under the dressing.  He questions whether is latex related    Medications Prior to Admission  Medication Sig Dispense Refill  . amLODipine (NORVASC) 5 MG tablet Take 1 tablet (5 mg total) by mouth daily. 30 tablet 12  . apixaban (ELIQUIS) 5 MG TABS tablet Take 1 tablet (5 mg total) by mouth 2 (two) times daily. 60 tablet 12  . benazepril (LOTENSIN) 40 MG tablet Take 1 tablet (40 mg total) by mouth daily. (Patient taking differently: Take 40 mg by mouth every evening. ) 30 tablet 12  . finasteride (PROSCAR) 5 MG tablet Take 1 tablet  (5 mg total) by mouth daily. (Patient taking differently: Take 5 mg by mouth every evening. ) 90 tablet 0  . metFORMIN (GLUCOPHAGE-XR) 500 MG 24 hr tablet Take 2 tablets (1,000 mg total) by mouth 2 (two) times daily. 360 tablet 3  . metoprolol succinate (TOPROL-XL) 25 MG 24 hr tablet Take 1 tablet (25 mg total) by mouth 2 (two) times daily. 60 tablet 12  . pantoprazole (PROTONIX) 40 MG tablet Take 40 mg by mouth daily before breakfast.    . propafenone (RYTHMOL SR) 425 MG 12 hr capsule Take 425 mg by mouth 2 (two) times daily.    [name] amoxicillin-clavulanate (AUGMENTIN) 875-125 MG tablet Take 1 tablet by mouth 2 (two) times daily. (Patient not taking: Reported on 04/01/2018) 14 tablet 0  . benzonatate (TESSALON) 200 MG capsule Take 1 capsule (200 mg total) by mouth 3 (three) times daily as needed. (Patient not taking: Reported on 04/12/2018) 30 capsule 0  . chlorpheniramine-HYDROcodone (TUSSIONEX PENNKINETIC ER) 10-8 MG/5ML SUER Take 5 mLs by mouth at bedtime as needed. (Patient not taking: Reported on 04/01/2018) 50 mL 0  . predniSONE (DELTASONE) 20 MG tablet Take 2 tablets (40 mg total) by mouth daily with breakfast. (Patient not taking: Reported on 04/01/2018) 10 tablet 0    Results for orders placed or performed during the hospital encounter of 04/29/18 (from the past 48 hour(s))  Glucose, capillary     Status: Abnormal   Collection Time: 04/29/18  6:02 AM  Result Value Ref Range   Glucose-Capillary 141 (H) 70 - 99 mg/dL  PT- INR Day of Surgery     Status: None   Collection Time: 04/29/18  6:27 AM  Result Value Ref Range   Prothrombin Time 12.4 11.4 - 15.2 seconds   INR 0.9 0.8 - 1.2    Comment: (NOTE) INR goal varies based on device and disease states. Performed at [facility], [place] [address] [place], [place] [address]    Dg Chest 2 View  Result Date: 04/29/2018 CLINICAL DATA:  Preop chest radiograph for back surgery EXAM: CHEST - 2 VIEW COMPARISON:  None. FINDINGS: The heart size  and mediastinal contours are within normal limits. Both lungs are clear. The visualized skeletal structures are unremarkable. IMPRESSION: No active cardiopulmonary disease. Electronically Signed   By: Ulyses Jarred M.D.   On: 04/29/2018 06:32    Review of Systems  Musculoskeletal: Positive for back pain.  Neurological: Positive for tingling and sensory change.    Blood pressure (!) 183/75, pulse 64, temperature 98.1 F (36.7 C), temperature source Oral, resp. rate 20, SpO2 100 %. Physical Exam  Constitutional: He is oriented to person, place, and time. He appears well-developed.  HENT:  Head: Normocephalic.  Eyes: Pupils are equal, round, and reactive to light.  Neck: Normal range of motion.  Respiratory: Effort normal.  GI: Soft. Bowel sounds are normal.  Musculoskeletal: Normal range of motion.  Neurological: He is alert and oriented to person, place, and time. He has normal strength. GCS eye subscore is 4. GCS verbal subscore is 5. GCS motor subscore is 6.  Strength is 5 of 5iliopsoas, quads, hamstrings, gastrocs, anterior tibialis, EHL.     Assessment/Plan 78 year old gentleman presents for L5-S1 posterior lumbar interbody fusion  Ebone Alcivar P, MD 04/29/2018, 7:51 AM

## 2018-04-29 NOTE — Anesthesia Procedure Notes (Signed)
Procedure Name: Intubation Date/Time: 04/29/2018 8:25 AM Performed by: Lillia Abed, MD Pre-anesthesia Checklist: Patient identified, Emergency Drugs available, Suction available, Patient being monitored and Timeout performed Patient Re-evaluated:Patient Re-evaluated prior to induction Oxygen Delivery Method: Circle system utilized Preoxygenation: Pre-oxygenation with 100% oxygen Induction Type: IV induction Ventilation: Oral airway inserted - appropriate to patient size Laryngoscope Size: Mac and 4 Grade View: Grade I Tube type: Oral Tube size: 7.5 mm Number of attempts: 1 Airway Equipment and Method: Stylet Placement Confirmation: ETT inserted through vocal cords under direct vision,  positive ETCO2,  CO2 detector and breath sounds checked- equal and bilateral Secured at: 22 cm Tube secured with: Tape

## 2018-04-30 LAB — GLUCOSE, CAPILLARY: Glucose-Capillary: 175 mg/dL — ABNORMAL HIGH (ref 70–99)

## 2018-04-30 MED ORDER — HYDROCODONE-ACETAMINOPHEN 5-325 MG PO TABS: 1.0000 | ORAL_TABLET | ORAL | 0 refills | Status: DC | PRN

## 2018-04-30 MED ORDER — CYCLOBENZAPRINE HCL 10 MG PO TABS: 10.0000 mg | ORAL_TABLET | Freq: Three times a day (TID) | ORAL | 0 refills | Status: DC | PRN

## 2018-04-30 NOTE — Evaluation (Signed)
Occupational Therapy Evaluation Patient Details Name: Sean Reyes. MRN: [id number] DOB: 24-Mar-1940 Today's Date: 04/30/2018    History of Present Illness  78 y/o male who presents s/p L5-S1 PLIF. PMH includes bil hip sx, back surgery, HTN, DM, A-fib.   Clinical Impression   Patient is s/p L5-s1 PLIF surgery resulting in functional limitations due to the deficits listed below (see OT problem list). Pt requires mod cues during functional task for back precautions. Pt can verbalize bending and twisting with cues for lifting. Pt needs cues to avoid precautions during the functional task with lack of awareness to error. Pt with pain due to bending with LB and pt educated that pain can be an indicator of poor alignment.  Patient will benefit from skilled OT acutely to increase independence and safety with ADLS to allow discharge home.     Follow Up Recommendations  No OT follow up    Equipment Recommendations  None recommended by OT    Recommendations for Other Services       Precautions / Restrictions Precautions Precautions: Back Precaution Booklet Issued: Yes (comment) Precaution Comments: reviewed adls with back precautions Required Braces or Orthoses: Spinal Brace Spinal Brace: Lumbar corset;Applied in sitting position Restrictions Weight Bearing Restrictions: No      Mobility Bed Mobility Overal bed mobility: Needs Assistance Bed Mobility: Rolling;Sidelying to Sit Rolling: Supervision Sidelying to sit: Supervision;HOB elevated       General bed mobility comments: in chair on arrival  Transfers Overall transfer level: Needs assistance Equipment used: None Transfers: Sit to/from Stand Sit to Stand: Supervision         General transfer comment: Supervision for safety as pt powered up to full stand. No assist required.     Balance Overall balance assessment: Needs assistance Sitting-balance support: Feet supported;No upper extremity supported Sitting  balance-Leahy Scale: Good Sitting balance - Comments: don doff brace x2 mod I   Standing balance support: During functional activity Standing balance-Leahy Scale: Good                             ADL either performed or assessed with clinical judgement   ADL Overall ADL's : Needs assistance/impaired                     Lower Body Dressing: Min guard;Sit to/from stand;Cueing for back precautions;With adaptive equipment Lower Body Dressing Details (indicate cue type and reason): educated on use of reacher sock aide and shoe horn. pt using item sbut states my wife can help me. pt again educated that he can not bend over to help wife with socks and offered modifications in the home to complete the task. Pt also again educated on the use of the AE to help for both of them Toilet Transfer: Modified Independent           Functional mobility during ADLs: Modified independent   Back handout provided and reviewed adls in detail. Pt educated on: clothing between brace, never sleep in brace, s avoid sitting for long periods of time, correct bed positioning for sleeping, correct sequence for bed mobility, avoiding lifting more than 5 pounds and never wash directly over incision. All education is complete and patient indicates understanding.       Vision Baseline Vision/History: Wears glasses Wears Glasses: At all times       Perception     Praxis      Pertinent Vitals/Pain Pain Assessment: Faces  Faces Pain Scale: Hurts little more Pain Location: back Pain Descriptors / Indicators: Sore;Grimacing Pain Intervention(s): Monitored during session;Repositioned;Premedicated before session     Hand Dominance Right   Extremity/Trunk Assessment Upper Extremity Assessment Upper Extremity Assessment: Overall WFL for tasks assessed   Lower Extremity Assessment Lower Extremity Assessment: Defer to PT evaluation   Cervical / Trunk Assessment Cervical / Trunk Assessment:  Other exceptions Cervical / Trunk Exceptions: s/p back surgery   Communication Communication Communication: No difficulties   Cognition Arousal/Alertness: Awake/alert Behavior During Therapy: WFL for tasks assessed/performed Overall Cognitive Status: Within Functional Limits for tasks assessed                                 General Comments: Pt reports feeling a little loopy from the anesthesia.    General Comments  dressing intact and dry    Exercises     Shoulder Instructions      Home Living Family/patient expects to be discharged to:: Private residence Living Arrangements: Spouse/significant other Available Help at Discharge: Family;Available 24 hours/day Type of Home: House Home Access: Stairs to enter [employer] of Steps: 2 Entrance Stairs-Rails: Right;Left;Can reach both Home Layout: One level     Bathroom Shower/Tub: Walk-in Hydrologist: Handicapped height     Home Equipment: Shower seat - built in;Walker - 2 wheels;Hand held shower head;Grab bars - tub/shower   Additional Comments: normally helps wife with socks and wife helps patient.       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         OT Problem List: Decreased activity tolerance;Impaired balance (sitting and/or standing);Decreased safety awareness;Decreased knowledge of use of DME or AE;Decreased knowledge of precautions      OT Treatment/Interventions: Self-care/ADL training;Therapeutic exercise;Neuromuscular education;Energy conservation;DME and/or AE instruction;Manual therapy;Therapeutic activities;Cognitive remediation/compensation;Patient/family education;Balance training    OT Goals(Current goals can be found in the care plan section) Acute Rehab OT Goals Patient Stated Goal: to return to church and small group with  church OT Goal Formulation: With patient Time For Goal Achievement: 05/07/18 Potential to Achieve Goals: Good  OT Frequency: Min 2X/week   Barriers to D/C:            Co-evaluation              AM-PAC OT "6 Clicks" Daily Activity     Outcome Measure Help from another person eating meals?: None Help from another person taking care of personal grooming?: None Help from another person toileting, which includes using toliet, bedpan, or urinal?: None Help from another person bathing (including washing, rinsing, drying)?: None Help from another person to put on and taking off regular upper body clothing?: None Help from another person to put on and taking off regular lower body clothing?: A Little 6 Click Score: 23   End of Session Equipment Utilized During Treatment: Back brace Nurse Communication: Mobility status;Precautions  Activity Tolerance: Patient tolerated treatment well Patient left: in chair;with call bell/phone within reach  OT Visit Diagnosis: Unsteadiness on feet (R26.81);Muscle weakness (generalized) (M62.81)                Time: 8366-2947 OT Time Calculation (min): 22 min Charges:  OT General Charges $OT Visit: 1 Visit OT Evaluation $OT Eval Moderate Complexity: 1 Mod   Sean Reyes, OTR/L  Acute Rehabilitation Services Pager: [phone] Office: [phone] .   Sean Reyes 04/30/2018, 10:28 AM

## 2018-04-30 NOTE — Discharge Summary (Signed)
Physician Discharge Summary  Patient ID: Sean Reyes. MRN: [id number] DOB/AGE: [birth year] 78 y.o.  Admit date: 04/29/2018 Discharge date: 04/30/2018  Admission Diagnoses:Herniated nucleus pulposis L5-S1 degenerative disc disease lumbar spinal stenosis L5-S1      Discharge Diagnoses: home   Discharged Condition: good  Hospital Course: The patient was admitted on 04/29/2018 and taken to the operating room where the patient underwent extension of PLIF L5-S1. The patient tolerated the procedure well and was taken to the recovery room and then to the floor in stable condition. The hospital course was routine. There were no complications. The wound remained clean dry and intact. Pt had appropriate back soreness. No complaints of leg pain or new N/T/W. The patient remained afebrile with stable vital signs, and tolerated a regular diet. The patient continued to increase activities, and pain was well controlled with oral pain medications.   Consults: None  Significant Diagnostic Studies:  Results for orders placed or performed during the hospital encounter of 04/29/18  PT- INR Day of Surgery  Result Value Ref Range   Prothrombin Time 12.4 11.4 - 15.2 seconds   INR 0.9 0.8 - 1.2  Glucose, capillary  Result Value Ref Range   Glucose-Capillary 141 (H) 70 - 99 mg/dL  Glucose, capillary  Result Value Ref Range   Glucose-Capillary 118 (H) 70 - 99 mg/dL  Glucose, capillary  Result Value Ref Range   Glucose-Capillary 146 (H) 70 - 99 mg/dL   Comment 1 Notify RN    Comment 2 Document in Chart   Glucose, capillary  Result Value Ref Range   Glucose-Capillary 269 (H) 70 - 99 mg/dL   Comment 1 Notify RN    Comment 2 Document in Chart   Glucose, capillary  Result Value Ref Range   Glucose-Capillary 207 (H) 70 - 99 mg/dL   Comment 1 Notify RN    Comment 2 Document in Chart   Glucose, capillary  Result Value Ref Range   Glucose-Capillary 175 (H) 70 - 99 mg/dL   Comment 1 Notify RN    Comment 2 Document in Chart     Dg Chest 2 View  Result Date: 04/29/2018 CLINICAL DATA:  Preop chest radiograph for back surgery EXAM: CHEST - 2 VIEW COMPARISON:  None. FINDINGS: The heart size and mediastinal contours are within normal limits. Both lungs are clear. The visualized skeletal structures are unremarkable. IMPRESSION: No active cardiopulmonary disease. Electronically Signed   By: Ulyses Jarred M.D.   On: 04/29/2018 06:32   Dg Lumbar Spine 2-3 Views  Result Date: 04/29/2018 CLINICAL DATA:  78 year old male. Posterior lumbar fusion. Initial encounter. EXAM: LUMBAR SPINE - 2-3 VIEW; DG C-ARM 61-120 MIN Fluoroscopic time: 22 seconds. COMPARISON:  11/13/2017. FINDINGS: Two intraoperative C-arm views submitted for review after surgery. Only portion of prior L2 through L5 fusion is noted. Posterior bar removed. Placement of pedicle screws S1 level. Interbody spacer placed L5-S1 level. IMPRESSION: Extension of fusion to L5-S1 level. Complete fusion can be assessed on follow-up. Electronically Signed   By: Genia Del M.D.   On: 04/29/2018 10:32   Dg C-arm 1-60 Min  Result Date: 04/29/2018 CLINICAL DATA:  78 year old male. Posterior lumbar fusion. Initial encounter. EXAM: LUMBAR SPINE - 2-3 VIEW; DG C-ARM 61-120 MIN Fluoroscopic time: 22 seconds. COMPARISON:  11/13/2017. FINDINGS: Two intraoperative C-arm views submitted for review after surgery. Only portion of prior L2 through L5 fusion is noted. Posterior bar removed. Placement of pedicle screws S1 level. Interbody spacer placed L5-S1 level. IMPRESSION: Extension  of fusion to L5-S1 level. Complete fusion can be assessed on follow-up. Electronically Signed   By: Genia Del M.D.   On: 04/29/2018 10:32    Antibiotics:  Anti-infectives (From admission, onward)   Start     Dose/Rate Route Frequency Ordered Stop   04/29/18 1630  vancomycin (VANCOCIN) IVPB 1000 mg/200 mL premix     1,000 mg 200 mL/hr over 60 Minutes Intravenous Every 12  hours 04/29/18 1311     04/29/18 0925  bacitracin 50,000 Units in sodium chloride 0.9 % 500 mL irrigation  Status:  Discontinued       As needed 04/29/18 0926 04/29/18 1114   04/29/18 0600  ceFAZolin (ANCEF) IVPB 2g/100 mL premix     2 g 200 mL/hr over 30 Minutes Intravenous On call to O.R. 04/29/18 0546 04/29/18 0840      Discharge Exam: Blood pressure 126/68, pulse 68, temperature 98.1 F (36.7 C), temperature source Oral, resp. rate 18, height 6\' 1"  (1.854 m), weight 85.9 kg, SpO2 100 %. Neurologic: Grossly normal Ambulating and voiding well, incision CDI  Discharge Medications:   Allergies as of 04/30/2018      Reactions   Levaquin [levofloxacin In D5w] Anaphylaxis, Shortness Of Breath   Shellfish Allergy Anaphylaxis   Has used duraprep, betadine and ioban in previous surgeries in 2019 and 2018 without issue   Amiodarone Other (See Comments)   Tremors and thyroid toxicity Other reaction(s): Other Tremors and thyroid toxicity Tremors and thyroid toxicity   Adhesive [tape] Other (See Comments)   Little red bumps under the dressing.  He questions whether is latex related      Medication List    STOP taking these medications   apixaban 5 MG Tabs tablet Commonly known as:  Eliquis     TAKE these medications   amLODipine 5 MG tablet Commonly known as:  NORVASC Take 1 tablet (5 mg total) by mouth daily.   amoxicillin-clavulanate 875-125 MG tablet Commonly known as:  AUGMENTIN Take 1 tablet by mouth 2 (two) times daily.   benazepril 40 MG tablet Commonly known as:  LOTENSIN Take 1 tablet (40 mg total) by mouth daily. What changed:    how much to take  how to take this  when to take this  additional instructions   benzonatate 200 MG capsule Commonly known as:  TESSALON Take 1 capsule (200 mg total) by mouth 3 (three) times daily as needed.   chlorpheniramine-HYDROcodone 10-8 MG/5ML Suer Commonly known as:  Tussionex Pennkinetic ER Take 5 mLs by mouth at  bedtime as needed.   cyclobenzaprine 10 MG tablet Commonly known as:  FLEXERIL Take 1 tablet (10 mg total) by mouth 3 (three) times daily as needed for muscle spasms.   finasteride 5 MG tablet Commonly known as:  PROSCAR Take 1 tablet (5 mg total) by mouth daily. What changed:  when to take this   HYDROcodone-acetaminophen 5-325 MG tablet Commonly known as:  NORCO/VICODIN Take 1 tablet by mouth every 4 (four) hours as needed for moderate pain.   metFORMIN 500 MG 24 hr tablet Commonly known as:  GLUCOPHAGE-XR Take 2 tablets (1,000 mg total) by mouth 2 (two) times daily.   metoprolol succinate 25 MG 24 hr tablet Commonly known as:  TOPROL-XL Take 1 tablet (25 mg total) by mouth 2 (two) times daily.   pantoprazole 40 MG tablet Commonly known as:  PROTONIX Take 40 mg by mouth daily before breakfast.   predniSONE 20 MG tablet Commonly known as:  DELTASONE Take 2 tablets (40 mg total) by mouth daily with breakfast.   propafenone 425 MG 12 hr capsule Commonly known as:  RYTHMOL SR Take 425 mg by mouth 2 (two) times daily.       Disposition: home   Final Dx: PLIF L5-S1  Discharge Instructions     Remove dressing in 72 hours   Complete by:  As directed    Call MD for:  difficulty breathing, headache or visual disturbances   Complete by:  As directed    Call MD for:  hives   Complete by:  As directed    Call MD for:  persistant dizziness or light-headedness   Complete by:  As directed    Call MD for:  persistant nausea and vomiting   Complete by:  As directed    Call MD for:  redness, tenderness, or signs of infection (pain, swelling, redness, odor or green/yellow discharge around incision site)   Complete by:  As directed    Call MD for:  severe uncontrolled pain   Complete by:  As directed    Call MD for:  temperature >100.4   Complete by:  As directed    Diet - low sodium heart healthy   Complete by:  As directed    Driving Restrictions   Complete by:  As  directed    No driving for 2 weeks, no riding in the car for 1 week   Increase activity slowly   Complete by:  As directed    Lifting restrictions   Complete by:  As directed    No lifting more than 8 lbs         Signed: Ocie Cornfield Isaid Salvia 04/30/2018, 7:59 AM

## 2018-04-30 NOTE — Progress Notes (Signed)
Pt doing well. Pt given D/C instructions with verbal understanding. Rx's were sent to pharmacy by MD. [name] incision is clean and dry with no sign of infection. [name] IV was removed prior to D/C. Pt D/C'd home via wheelchair per MD order. Pt is stable @ D/C and has no other needs at this time. Ladasha Schnackenberg, RN  

## 2018-04-30 NOTE — Discharge Instructions (Signed)
Wound Care °Keep incision covered and dry for one week.  If you shower prior to then, cover incision with plastic wrap.  °You may remove outer bandage after one week and shower.  °Do not put any creams, lotions, or ointments on incision. °Leave steri-strips on neck.  They will fall off by themselves. °Activity °Walk each and every day, increasing distance each day. °No lifting greater than 5 lbs.  Avoid bending, arching, or twisting. °No driving for 2 weeks; may ride as a passenger locally. °If provided with back brace, wear when out of bed.  It is not necessary to wear in bed. °Diet °Resume your normal diet.  °Return to Work °Will be discussed at you follow up appointment. °Call Your Doctor If Any of These Occur °Redness, drainage, or swelling at the wound.  °Temperature greater than 101 degrees. °Severe pain not relieved by pain medication. °Incision starts to come apart. °Follow Up Appt °Call today for appointment in 1-2 weeks ([phone]) or for problems.  If you have any hardware placed in your spine, you will need an x-ray before your appointment. ° ° °Spinal Fusion, Adult, Care After °This sheet gives you information about how to care for yourself after your procedure. Your doctor may also give you more specific instructions. If you have problems or questions, contact your doctor. °Follow these instructions at home: °Medicines °· Take over-the-counter and prescription medicines only as told by your doctor. These include any medicines for pain or blood-thinning medicines (anticoagulants). °· If you were prescribed an antibiotic medicine, take it as told by your doctor. Do not stop taking the antibiotic even if you start to feel better. °· Do not drive for 24 hours if you were given a medicine to help you relax (sedative) during your procedure. °· Do not drive or use heavy machinery while taking prescription pain medicine. °If you have a brace: °· Wear the brace as told by your doctor. Take it off only as told by  your doctor. °· Keep the brace clean. °Managing pain, stiffness, and swelling °· If directed, put ice on the surgery area: °? If you have a removable brace, take it off as told by your doctor. °? Put ice in a plastic bag. °? Place a towel between your skin and the bag. °? Leave the ice on for 20 minutes, 2-3 times a day. °Surgery cut care ° °· Follow instructions from your doctor about how to take care of your cut from surgery (incision). Make sure you: °? Wash your hands with soap and water before you change your bandage (dressing). If you cannot use soap and water, use hand sanitizer. °? Change your bandage as told by your doctor. °? Leave stitches (sutures), skin glue, or skin tape (adhesive) strips in place. They may need to stay in place for 2 weeks or longer. If tape strips get loose and curl up, you may trim the loose edges. Do not remove tape strips completely unless your doctor says it is okay. °· Keep your cut from surgery clean and dry. °? Do not take baths, swim, or use a hot tub until your doctor says it is okay. °? Ask your doctor if you can take showers. You may only be allowed to take sponge baths. °· Every day, check your cut from surgery and the area around it for: °? More redness, swelling, or pain. °? Fluid or blood. °? Warmth. °? Pus or a bad smell. °· If you have a drain tube, follow instructions   from your doctor about caring for it. Do not take out the drain tube or any bandages unless your doctor says it is okay. °Physical activity °· Rest and protect your back as much as possible. °· Follow instructions from your doctor about how to move. Use good posture to help your spine heal. °· Do not lift anything that is heavier than 8 lb (3.6 kg), or the limit that you are told, until your doctor says that it is safe. °· Do not twist or bend at the waist until your doctor says it is okay. °· It is best if you: °? Do not make pushing and pulling motions. °? Do not sit or lie down in the same position  for a long time. °? Do not raise your hands or arms above your head. °· Return to your normal activities as told by your doctor. Ask your doctor what activities are safe for you. Rest and protect your back as much as you can. °· Do not start to exercise until your doctor says it is okay. Ask your doctor what kinds of exercise you can do to make your back stronger. °General instructions °· To prevent blood clots and lessen swelling in your legs: °? Wear compression stockings as told. °? Walk one or more times every few hours as told by your doctor. °· Do not use any products that contain nicotine or tobacco, such as cigarettes and e-cigarettes. These can delay bone healing. If you need help quitting, ask your doctor. °· To prevent or treat constipation while you are taking prescription pain medicine, your doctor may suggest that you: °? Drink enough fluid to keep your pee (urine) pale yellow. °? Take over-the-counter or prescription medicines. °? Eat foods that are high in fiber. These include fresh fruits and vegetables, whole grains, and beans. °? Limit foods that are high in fat and processed sugars, such as fried and sweet foods. °· Keep all follow-up visits as told by your doctor. This is important. °Contact a doctor if: °· Your pain gets worse. °· Your medicine does not help your pain. °· Your legs or feet get painful or swollen. °· Your cut from surgery is more red, swollen, or painful. °· Your cut from surgery feels warm to the touch. °· You have: °? Fluid or blood coming from your cut from surgery. °? Pus or a bad smell coming from your cut from surgery. °? A fever. °? Weakness or loss of feeling (numbness) in your legs that is new or getting worse. °? Trouble controlling when you pee (urinate) or poop (have a bowel movement). °· You feel sick to your stomach (nauseous). °· You throw up (vomit). °Get help right away if: °· Your pain is very bad. °· You have chest pain. °· You have trouble breathing. °· You  start to have a cough. °These symptoms may be an emergency. Do not wait to see if the symptoms will go away. Get medical help right away. Call your local emergency services (911 in the [place] Do not drive yourself to the hospital. °Summary °· After the procedure, it is common to have pain in your back and pain by your surgery cut(s). °· Icing and pain medicines may help to control the pain. Follow directions from your doctor. °· Rest and protect your back as much as possible. Do not twist or bend at the waist. °· Get up and walk one or more times every few hours as told by your doctor. °This information   is not intended to replace advice given to you by your health care provider. Make sure you discuss any questions you have with your health care provider. °Document Released: 05/26/2010 Document Revised: 05/16/2016 Document Reviewed: 05/16/2016 °[employer] Interactive Patient Education © 2019 [employer] ° °

## 2018-04-30 NOTE — Progress Notes (Signed)
Physical Therapy Treatment Note  Assessment: Pt progressing well with post-op mobility. Pt reports feeling much better today and states that he has not taken any pain medication (also attributing this to feeling so well). Pt was educated on precautions, car transfer, activity progression and positioning recommendations. Will continue to follow and progress as able per POC.   04/30/18 1022  PT Visit Information  Last PT Received On 04/30/18  Assistance Needed +1  History of Present Illness Patient is a 78 y/o male who presents s/p L5-S1 PLIF. PMH includes bil hip sx, back surgery, HTN, DM, A-fib.  Subjective Data  Patient Stated Goal to walk without pain  Precautions  Precautions Back  Precaution Booklet Issued Yes (comment)  Precaution Comments Reviewed handout and precautions  Required Braces or Orthoses Spinal Brace  Spinal Brace Lumbar corset;Applied in sitting position  Restrictions  Weight Bearing Restrictions No  Pain Assessment  Pain Assessment Faces  Faces Pain Scale 2  Pain Location back  Pain Descriptors / Indicators Sore;Operative site guarding  Pain Intervention(s) Monitored during session  Cognition  Arousal/Alertness Awake/alert  Behavior During Therapy WFL for tasks assessed/performed  Overall Cognitive Status Within Functional Limits for tasks assessed  General Comments Pt reports feeling a little loopy from the anesthesia.   Bed Mobility  Overal bed mobility Needs Assistance  Bed Mobility Rolling;Sidelying to Sit  Rolling Supervision  Sidelying to sit Supervision;HOB elevated  General bed mobility comments HOB slightly elevated with rails lowered to simulate home environment.   Transfers  Overall transfer level Needs assistance  Equipment used None  Transfers Sit to/from Stand  Sit to Stand Supervision  General transfer comment Supervision for safety as pt powered up to full stand. No assist required.   Ambulation/Gait  Ambulation/Gait assistance  Supervision  Gait Distance (Feet) 400 Feet  Assistive device None  Gait Pattern/deviations Step-through pattern;Decreased stride length;Shuffle  General Gait Details Occasionally guarded but otherwise with casual gait.   Gait velocity decreased  Gait velocity interpretation 1.31 - 2.62 ft/sec, indicative of limited community ambulator  General stair comments Pt declined stair training at this time.   Balance  Overall balance assessment Needs assistance  Sitting-balance support Feet supported;No upper extremity supported  Sitting balance-Leahy Scale Good  Standing balance support During functional activity  Standing balance-Leahy Scale Fair  PT - End of Session  Equipment Utilized During Treatment Gait belt;Back brace  Activity Tolerance Patient tolerated treatment well  Patient left in bed;with call bell/phone within reach;with family/visitor present;with SCD's reapplied  Nurse Communication Mobility status   PT - Assessment/Plan  PT Plan Current plan remains appropriate  PT Visit Diagnosis Pain;Muscle weakness (generalized) (M62.81);Difficulty in walking, not elsewhere classified (R26.2)  Pain - part of body  (back)  PT Frequency (ACUTE ONLY) Min 5X/week  Follow Up Recommendations No PT follow up;Supervision for mobility/OOB  PT equipment None recommended by PT  AM-PAC PT "6 Clicks" Mobility Outcome Measure (Version 2)  Help needed turning from your back to your side while in a flat bed without using bedrails? 3  Help needed moving from lying on your back to sitting on the side of a flat bed without using bedrails? 3  Help needed moving to and from a bed to a chair (including a wheelchair)? 3  Help needed standing up from a chair using your arms (e.g., wheelchair or bedside chair)? 3  Help needed to walk in hospital room? 3  Help needed climbing 3-5 steps with a railing?  3  6 Click  Score 18  Consider Recommendation of Discharge To: Home with [facility]  PT Goal Progression  Progress  towards PT goals Progressing toward goals  Acute Rehab PT Goals  PT Goal Formulation With patient  Time For Goal Achievement 05/13/18  Potential to Achieve Goals Good  PT Time Calculation  PT Start Time (ACUTE ONLY) 0909  PT Stop Time (ACUTE ONLY) 7614  PT Time Calculation (min) (ACUTE ONLY) 18 min  PT General Charges  $$ ACUTE PT VISIT 1 Visit  PT Treatments  $Gait Training 8-22 mins   Rolinda Roan, PT, DPT Acute Rehabilitation Services Pager: [phone] Office: [phone]

## 2018-05-27 DIAGNOSIS — E118 Type 2 diabetes mellitus with unspecified complications: Secondary | ICD-10-CM | POA: Diagnosis not present

## 2018-05-27 DIAGNOSIS — N400 Enlarged prostate without lower urinary tract symptoms: Secondary | ICD-10-CM | POA: Diagnosis not present

## 2018-05-27 DIAGNOSIS — N3281 Overactive bladder: Secondary | ICD-10-CM | POA: Diagnosis not present

## 2018-05-30 DIAGNOSIS — M5440 Lumbago with sciatica, unspecified side: Secondary | ICD-10-CM | POA: Diagnosis not present

## 2018-06-05 ENCOUNTER — Telehealth: Payer: Self-pay | Admitting: Family Medicine

## 2018-06-05 NOTE — Telephone Encounter (Signed)
Copied from [place]. Topic: General - Inquiry >> Jun 05, 2018 10:17 AM Margot Ables wrote: Caller/Agency: Caryl Pina w/[employer] Callback Number: [phone], secure VM order: Home INR testing Reason for call: called today to set up appt and pt states that he is on Eliquis not coumadin. Concerns are that pt may be on 2 blood thinners & that pt may not need INR testing at home >> Jun 05, 2018 11:26 AM Don Perking M wrote: Please advise.

## 2018-06-12 ENCOUNTER — Telehealth: Payer: Self-pay

## 2018-06-12 NOTE — Telephone Encounter (Signed)
Copied from [place] [phone]. Topic: Referral - Status >> Jun 12, 2018 82:42 AM Simone Curia D wrote: 9/98/0699 spoke with patient about Medicare Extrahelp for financial assistance with medications. MA

## 2018-06-25 ENCOUNTER — Ambulatory Visit: Payer: Self-pay | Admitting: *Deleted

## 2018-06-25 NOTE — Chronic Care Management (AMB) (Signed)
Chronic Care Management   Note  06/25/2018 Name: Krish Bailly. MRN: [id number] DOB: May 02, 1940  Seaborn Nakama. is a 78 y.o. year old male who is a primary care patient of Crissman, Jeannette How, MD. I reached out to [name]. by phone today in response to a referral sent by Mr. Jules Schick Jr.'s health plan.    Mr. Stapel was given information about Chronic Care Management services today including:  1. CCM service includes personalized support from designated clinical staff supervised by his physician, including individualized plan of care and coordination with other care providers 2. 24/7 contact phone numbers for assistance for urgent and routine care needs. 3. Service will only be billed when office clinical staff spend 20 minutes or more in a month to coordinate care. 4. Only one practitioner may furnish and bill the service in a calendar month. 5. The patient may stop CCM services at any time (effective at the end of the month) by phone call to the office staff. 6. The patient will be responsible for cost sharing (co-pay) of up to 20% of the service fee (after annual deductible is met).  Patient agreed to services and verbal consent obtained.   Follow up plan: Telephone appointment with CCM team member scheduled for:06/30/2018  [place]  [email or web address]   ??[phone]

## 2018-06-26 ENCOUNTER — Other Ambulatory Visit: Payer: Self-pay | Admitting: Family Medicine

## 2018-06-26 DIAGNOSIS — E119 Type 2 diabetes mellitus without complications: Secondary | ICD-10-CM

## 2018-06-26 MED ORDER — METFORMIN HCL ER 500 MG PO TB24: 1000.0000 mg | ORAL_TABLET | Freq: Two times a day (BID) | ORAL | 3 refills | Status: DC

## 2018-06-28 ENCOUNTER — Ambulatory Visit: Payer: Medicare Other | Admitting: *Deleted

## 2018-06-28 DIAGNOSIS — I4891 Unspecified atrial fibrillation: Secondary | ICD-10-CM

## 2018-06-28 DIAGNOSIS — E119 Type 2 diabetes mellitus without complications: Secondary | ICD-10-CM

## 2018-06-28 DIAGNOSIS — M51369 Other intervertebral disc degeneration, lumbar region without mention of lumbar back pain or lower extremity pain: Secondary | ICD-10-CM

## 2018-06-28 DIAGNOSIS — N401 Enlarged prostate with lower urinary tract symptoms: Secondary | ICD-10-CM

## 2018-06-28 DIAGNOSIS — M5136 Other intervertebral disc degeneration, lumbar region: Secondary | ICD-10-CM

## 2018-06-28 DIAGNOSIS — N138 Other obstructive and reflux uropathy: Secondary | ICD-10-CM

## 2018-06-29 NOTE — Chronic Care Management (AMB) (Signed)
  Chronic Care Management   Follow Up Note   06/29/2018 Name: Sean Reyes. MRN: [id number] DOB: [birth year]  Referred by: [name], MD Reason for referral : Chronic Care Management (DM/Overactive Bladder/Back pain)   Sean Reyes. is a 78 y.o. year old male who is a primary care patient of Crissman, Jeannette How, MD. The CCM team was consulted for assistance with chronic disease management and care coordination needs.    Review of patient status, including review of consultants reports, relevant laboratory and other test results, and collaboration with appropriate care team members and the patient's provider was performed as part of comprehensive patient evaluation and provision of chronic care management services.   Reached patient by phone. He felt he was doing well maintaining sugar control. He felt his main concerns were hi overactive bladder and continued back pain. Plan to assist him with obtaining HHPT in the future once he has talked with his orthopedic surgeon about this. Patient revealed he had two medications that were high cost and was interested in speaking with CCM PharmD about this. Referral sent.  We discussed staying heathy during covid-19.   Lab Results  Component Value Date   HGBA1C 6.8 (H) 04/19/2018   HGBA1C 6.9 12/20/2017   HGBA1C 6.3 08/22/2017   Lab Results  Component Value Date   LDLCALC 78 12/20/2017   CREATININE 1.15 04/19/2018      Goals Addressed            This Visit's Progress   . I want to stay healthy during this virus (pt-stated)       Current Barriers:  [name] Knowledge Deficits related to COVID-19 and impact on patient self health management  Clinical Goal(s):  [name] Over the next 30 days, patient will verbalize basic understanding of COVID-19 impact on individual health and self health management as evidenced by verbalization of basic understanding of COVID-19 as a viral disease, measures to prevent exposure, signs and symptoms, when to  contact provider  Interventions: . Provided education to patient re: Covid-19 . Reviewed scheduled/upcoming provider appointments including: Upcoming ortho and urology appointments . Pharmacy referral for Patient expressed high cost medications  Patient Self Care Activities:  . Self administers medications as prescribed . Performs ADL's independently  Initial goal documentation                     The CM team will reach out to the patient again over the next 14 days.    Merlene Morse Sumner Boesch RN, BSN Nurse Case Editor, commissioning [facility] Care [facility]  [phone]) Business Mobile

## 2018-06-29 NOTE — Patient Instructions (Addendum)
Thank you allowing the Chronic Care Management Team to be a part of your care! It was a pleasure speaking with you today!   CCM (Chronic Care Management) Team   Dakwon Wenberg RN, BSN Nurse Care Coordinator  [phone]  [facility] PharmD  Clinical Pharmacist  [phone]  Eula Fried LCSW Clinical Social Worker [phone]  Goals Addressed            This Visit's Progress   . I want to stay healthy during this virus (pt-stated)       Current Barriers:  [name] Knowledge Deficits related to COVID-19 and impact on patient self health management  Clinical Goal(s):  [name] Over the next 30 days, patient will verbalize basic understanding of COVID-19 impact on individual health and self health management as evidenced by verbalization of basic understanding of COVID-19 as a viral disease, measures to prevent exposure, signs and symptoms, when to contact provider  Interventions: . Provided education to patient re: Covid-19 . Reviewed scheduled/upcoming provider appointments including: Upcoming ortho and urology appointments . Pharmacy referral for Patient expressed high cost medications  Patient Self Care Activities:  . Self administers medications as prescribed . Performs ADL's independently  Initial goal documentation                      The patient verbalized understanding of instructions provided today and declined a print copy of patient instruction materials.   The CM team will reach out to the patient again over the next 14 days.  The patient has been provided with contact information for the chronic care management team and has been advised to call with any health related questions or concerns.    For information about COVID-19 or "Corona Virus", the following web resources may be helpful:  CDC: BeginnerSteps.be    [place]:   [email or web address] ====  Botulinum Toxin Bladder Injection  A botulinum toxin bladder injection is a procedure to treat an overactive bladder. During the procedure, a drug called botulinum toxin is injected into the bladder through a long, thin needle. This drug relaxes the bladder muscles and reduces overactivity. You may need this procedure if your medicines are not working or you cannot take them. The procedure may be repeated as needed. The treatment usually lasts for 6 months. Your health care provider will monitor you to see how well you respond. Tell a health care provider about:  Any allergies you have.  All medicines you are taking, including vitamins, herbs, eye drops, creams, and over-the-counter medicines.  Any problems you or family members have had with anesthetic medicines.  Any blood disorders you have.  Any surgeries you have had.  Any medical conditions you have.  Any previous reactions to a botulinum toxin injection.  Any symptoms of urinary tract infection. These include chills, fever, a burning feeling when passing urine, and needing to pass urine often.  Whether you are pregnant or may be pregnant. What are the risks? Generally this is a safe procedure. However, problems may occur, including:  Not being able to pass urine. If this happens, you may need to have your bladder emptied with a thin tube inserted into your urethra (urinary catheter).  Bleeding.  Urinary tract infection.  Allergic reaction to the botulinum toxin.  Pain or burning when passing urine.  Damage to other structures or organs. What happens before the procedure? Staying hydrated Follow instructions from your health care provider about hydration, which may include:  Up to 2  hours before the procedure - you may continue to drink clear liquids, such as water, clear fruit juice, black coffee,  and plain tea. Eating and drinking restrictions Follow instructions from your health care provider about eating and drinking, which may include:  8 hours before the procedure - stop eating heavy meals or foods, such as meat, fried foods, or fatty foods.  6 hours before the procedure - stop eating light meals or foods, such as toast or cereal.  6 hours before the procedure - stop drinking milk or drinks that contain milk.  2 hours before the procedure - stop drinking clear liquids. Medicines Ask your health care provider about:  Changing or stopping your regular medicines. This is especially important if you are taking diabetes medicines or blood thinners.  Taking medicines such as aspirin and ibuprofen. These medicines can thin your blood. Do not take these medicines unless your health care provider tells you to take them.  Taking over-the-counter medicines, vitamins, herbs, and supplements. General instructions  Plan to have someone take you home from the hospital or clinic.  If you will be going home right after the procedure, plan to have someone with you for 24 hours.  Ask your health care provider what steps will be taken to help prevent infection. These may include: ? Removing hair at the procedure site. ? Washing skin with a germ-killing soap. ? Antibiotic medicine. What happens during the procedure?   You will be asked to empty your bladder.  An IV will be inserted into one of your veins.  You will be given one or more of the following: ? A medicine to help you relax (sedative). ? A medicine to numb the area (local anesthetic). ? A medicine to make you fall asleep (general anesthetic).  A long, thin scope called a cystoscope will be passed into your bladder through the part of the body that carries urine from your bladder (urethra).  The cystoscope will be used to fill your bladder with water.  A long needle will be passed through the cystoscope and into the  bladder.  The botulinum toxin will be injected into your bladder. It may be injected into multiple areas of your bladder.  Your bladder will be emptied, and the cystoscope will be removed. The procedure may vary among health care providers and hospitals. What can I expect after procedure? After your procedure, it is common to have:  Blood-tinged urine.  Burning or soreness when you pass urine. Follow these instructions at home: Medicines  Take over-the-counter and prescription medicines only as told by your health care provider.  If you were prescribed an antibiotic medicine, take it as told by your health care provider. Do not stop taking the antibiotic even if you start to feel better. General instructions   Do not drive for 24 hours if you were given a sedative during your procedure.  Drink enough fluid to keep your urine pale yellow.  Return to your normal activities as told by your health care provider. Ask your health care provider what activities are safe for you.  Keep all follow-up visits as told by your health care provider. This is important. Contact a health care provider if you have:  A fever or chills.  Blood-tinged urine for more than one day after your procedure.  Worsening pain or burning when you pass urine.  Pain or burning when passing urine for more than two days after your procedure.  Trouble emptying your bladder. Get help right  away if you:  Have bright red blood in your urine.  Are unable to pass urine. Summary  A botulinum toxin bladder injection is a procedure to treat an overactive bladder.  This is generally a very safe procedure. However, problems may occur, including not being able to pass urine, bleeding, infection, pain, and allergic reactions to medicines.  You will be told when to stop eating and drinking, and what medicines to change or stop. Follow instructions carefully.  After the procedure, it is common to have blood in urine  and to have soreness or burning when passing urine.  Contact a health care provider if you have a fever, have blood in urine for more than a few days, or have trouble passing urine. Get help right away if you have bright red blood in the urine, or if you are unable to pass urine. This information is not intended to replace advice given to you by your health care provider. Make sure you discuss any questions you have with your health care provider. Document Released: 10/21/2014 Document Revised: 08/10/2017 Document Reviewed: 08/10/2017 Elsevier Interactive Patient Education  2019 [employer].

## 2018-07-01 ENCOUNTER — Other Ambulatory Visit: Payer: Self-pay | Admitting: Neurosurgery

## 2018-07-01 DIAGNOSIS — M4807 Spinal stenosis, lumbosacral region: Secondary | ICD-10-CM | POA: Diagnosis not present

## 2018-07-03 ENCOUNTER — Ambulatory Visit
Admission: RE | Admit: 2018-07-03 | Discharge: 2018-07-03 | Disposition: A | Discharge status: Home / Self Care | Payer: Medicare Other | Source: Ambulatory Visit | Attending: Neurosurgery | Admitting: Neurosurgery

## 2018-07-03 ENCOUNTER — Other Ambulatory Visit: Payer: Self-pay

## 2018-07-03 DIAGNOSIS — M4807 Spinal stenosis, lumbosacral region: Secondary | ICD-10-CM | POA: Diagnosis not present

## 2018-07-03 DIAGNOSIS — M2578 Osteophyte, vertebrae: Secondary | ICD-10-CM | POA: Diagnosis not present

## 2018-07-03 DIAGNOSIS — M4326 Fusion of spine, lumbar region: Secondary | ICD-10-CM | POA: Diagnosis not present

## 2018-07-04 ENCOUNTER — Telehealth: Payer: Self-pay | Admitting: Family Medicine

## 2018-07-04 MED ORDER — FINASTERIDE 5 MG PO TABS: 5.0000 mg | ORAL_TABLET | Freq: Every day | ORAL | 2 refills | Status: DC

## 2018-07-04 NOTE — Telephone Encounter (Signed)
done

## 2018-07-04 NOTE — Telephone Encounter (Signed)
Copied from [place] [id number]. Topic: Quick Communication - Rx Refill/Question >> Jul 04, 2018  9:55 AM Rutherford Nail, NT wrote: **Patient states that his urologist prescribed this medication, but he is not going back to her. Would like to know if Dr Jeananne Rama could take over filling this medication?**  Medication: finasteride (PROSCAR) 5 MG tablet  Has the patient contacted their pharmacy? yes (Agent: If no, request that the patient contact the pharmacy for the refill.) (Agent: If yes, when and what did the pharmacy advise?)  Preferred Pharmacy (with phone number or street name): [place], [place]  Agent: Please be advised that RX refills may take up to 3 business days. We ask that you follow-up with your pharmacy.

## 2018-07-04 NOTE — Telephone Encounter (Signed)
Called pt to let him know that Dr Jeananne Rama completed request.

## 2018-07-05 ENCOUNTER — Ambulatory Visit: Payer: Medicare Other | Admitting: Pharmacist

## 2018-07-05 DIAGNOSIS — I4891 Unspecified atrial fibrillation: Secondary | ICD-10-CM

## 2018-07-05 NOTE — Chronic Care Management (AMB) (Signed)
Chronic Care Management   Note  07/05/2018 Name: Sean Reyes. MRN: [id number] DOB: 1940/11/06   Subjective:  Sean Reyes. is a 78 y.o. year old male who is a primary care patient of Crissman, Jeannette How, MD. The CCM team was consulted for assistance with chronic disease management and care coordination needs.    Contacted today for medication review and evaluation for patient assistance.  Review of patient status, including review of consultants reports, laboratory and other test data, was performed as part of comprehensive evaluation and provision of chronic care management services.   Objective: Lab Results  Component Value Date   CREATININE 1.15 04/19/2018   CREATININE 1.17 12/20/2017   CREATININE 1.23 06/29/2017    Lab Results  Component Value Date   HGBA1C 6.8 (H) 04/19/2018    Lipid Panel     Component Value Date/Time   CHOL 156 12/20/2017 1026   TRIG 175 (H) 12/20/2017 1026   HDL 43 12/20/2017 1026   CHOLHDL 3.6 12/20/2017 1026   LDLCALC 78 12/20/2017 1026    BP Readings from Last 3 Encounters:  04/30/18 126/68  04/19/18 (!) 146/68  04/01/18 110/73    Allergies  Allergen Reactions  . Levaquin [Levofloxacin In D5w] Anaphylaxis and Shortness Of Breath  . Shellfish Allergy Anaphylaxis    Has used duraprep, betadine and ioban in previous surgeries in 2019 and 2018 without issue  . Amiodarone Other (See Comments)    Tremors and thyroid toxicity  . Adhesive [Tape] Other (See Comments)    Little red bumps under the dressing.  He questions whether is latex related    Medications Reviewed Today    Reviewed by De Hollingshead, [facility] (Pharmacist) on 07/05/18 at 1423  Med List Status: <None>  Medication Order Taking? Sig Documenting Provider Last Dose Status Informant  amLODipine (NORVASC) 5 MG tablet [id number] Yes Take 1 tablet (5 mg total) by mouth daily. [name], MD Taking Active Self  benazepril (LOTENSIN) 40 MG tablet [id number] Yes Take 1 tablet  (40 mg total) by mouth daily. [name], MD Taking Active Self       Patient not taking:       Discontinued 07/05/18 1422 (Patient Preference)   ELIQUIS 5 MG TABS tablet [id number] Yes Take 5 mg by mouth 2 (two) times daily.  [provider] Taking Active   finasteride (PROSCAR) 5 MG tablet [id number] Yes Take 1 tablet (5 mg total) by mouth daily. [name], MD Taking Active   HYDROcodone-acetaminophen (NORCO/VICODIN) 5-325 MG tablet [id number] No Take 1 tablet by mouth every 4 (four) hours as needed for moderate pain.  Patient not taking:  Reported on 07/05/2018   Eleonore Chiquito, NP Not Taking Active   metFORMIN (GLUCOPHAGE-XR) 500 MG 24 hr tablet [id number] Yes Take 2 tablets (1,000 mg total) by mouth 2 (two) times daily. [name], MD Taking Active   metoprolol succinate (TOPROL-XL) 25 MG 24 hr tablet [id number] Yes Take 1 tablet (25 mg total) by mouth 2 (two) times daily. [name], MD Taking Active Self  pantoprazole (PROTONIX) 40 MG tablet [id number] Yes Take 40 mg by mouth daily before breakfast. [provider] Taking Active Self  propafenone (RYTHMOL SR) 425 MG 12 hr capsule [id number] Yes Take 425 mg by mouth 2 (two) times daily. [provider] Taking Active Self           Assessment:   Goals Addressed  This Visit's Progress     Patient Stated   . "My medications are expensive" (pt-stated)       Current Barriers:  . Financial Barriers- patient reported Eliquis and Rhythmol SR being expensive medications . Reports hx atrial fibrillation, but no known recurrence of arrhythmia since last ablation o Managed on metoprolol XL for rate control, Rhythmol SR for rhythm, and Eliquis for stroke ppx  Pharmacist Clinical Goal(s):  [name] Over the next 10 days, patient will work with PharmD to address needs related to medication access  Interventions: . Comprehensive medication review performed.  . Reviewed criteria for  patient assistance programs for Eliquis ([place]) and Rhythmol ([place]). Unfortunately, patient's total household income puts him over the requirements for both programs.  [name] He verbalizes that he is able to afford the medications at this time . Encouraged to reach out with any future medication related questions or concerns.   Patient Self Care Activities:  . Calls pharmacy for medication refills . Calls provider office for new concerns or questions  Initial goal documentation]        Plan: - CCM team will continue to be available for any health-related needs.   Catie Darnelle Maffucci, PharmD Clinical Pharmacist [place] [phone]

## 2018-07-05 NOTE — Patient Instructions (Addendum)
Visit Information  Goals Addressed            This Visit's Progress     Patient Stated   . "My medications are expensive" (pt-stated)       Current Barriers:  . Financial Barriers- patient reported Eliquis and Rhythmol SR being expensive medications . Reports hx atrial fibrillation, but no known recurrence of arrhythmia since last ablation o Managed on metoprolol XL for rate control, Rhythmol SR for rhythm, and Eliquis for stroke ppx  Pharmacist Clinical Goal(s):  [name] Over the next 10 days, patient will work with PharmD to address needs related to medication access  Interventions: . Comprehensive medication review performed.  . Reviewed criteria for patient assistance programs for Eliquis ([place]) and Rhythmol ([place]). Unfortunately, patient's total household income puts him over the requirements for both programs.  [name] He verbalizes that he is able to afford the medications at this time . Encouraged to reach out with any future medication related questions or concerns.   Patient Self Care Activities:  . Calls pharmacy for medication refills . Calls provider office for new concerns or questions  Initial goal documentation]        The patient verbalized understanding of instructions provided today and declined a print copy of patient instruction materials.     Plan: - CCM team will continue to be available for any health-related needs.   Catie Darnelle Maffucci, PharmD Clinical Pharmacist [facility] [phone]

## 2018-07-09 ENCOUNTER — Telehealth: Payer: Self-pay

## 2018-07-09 NOTE — Telephone Encounter (Signed)
Notified patient.

## 2018-07-09 NOTE — Telephone Encounter (Signed)
Copied from [place] [id number]. Topic: General - Other >> Jul 05, 2018  4:41 PM Parke Poisson wrote: Reason for CRM: Pt wants to know if you have gotten correspondence from Dr Brien Few. He states he needs an approval from you to quit taking ELIQUIS 5 MG TABS tablet for 3 days prior to an injection >> Jul 09, 2018 11:45 AM [name], MD wrote: [facility] to stop >> Jul 09, 2018  1:30 PM Amada Kingfisher, CMA wrote: Message left for Dr. Lauris Poag CMA that it was okay for patient to stop eliquis x 3 days prior to injection. Left callback number.   Dr. Brien Few - [phone]

## 2018-07-10 ENCOUNTER — Ambulatory Visit (INDEPENDENT_AMBULATORY_CARE_PROVIDER_SITE_OTHER): Payer: Medicare Other | Admitting: Family Medicine

## 2018-07-10 ENCOUNTER — Encounter: Payer: Self-pay | Admitting: Family Medicine

## 2018-07-10 ENCOUNTER — Other Ambulatory Visit: Payer: Self-pay

## 2018-07-10 DIAGNOSIS — I480 Paroxysmal atrial fibrillation: Secondary | ICD-10-CM | POA: Diagnosis not present

## 2018-07-10 DIAGNOSIS — E1143 Type 2 diabetes mellitus with diabetic autonomic (poly)neuropathy: Secondary | ICD-10-CM

## 2018-07-10 DIAGNOSIS — M5136 Other intervertebral disc degeneration, lumbar region: Secondary | ICD-10-CM

## 2018-07-10 NOTE — Assessment & Plan Note (Signed)
The current medical regimen is effective;  continue present plan and medications.  

## 2018-07-10 NOTE — Assessment & Plan Note (Addendum)
Has had surgical procedure with some temporary relief now having problems again with injection scheduled later next week.  Okay to hold Eliquis 3 days prior to injection.

## 2018-07-10 NOTE — Progress Notes (Addendum)
There were no vitals taken for this visit.   Subjective:    Patient ID: Sean Hacker., male    DOB: [birth year], 78 y.o.   MRN: [id number]  HPI: Sean Reser. is a 78 y.o. male  Med check  [place] for a synchronous communication visit. Today's visit due to COVID-19 isolation precautions I connected with and verified that I am speaking with the correct person using two identifiers.   I discussed the limitations, risks, security and privacy concerns of performing an evaluation and management service by telecommunication and the availability of in person appointments. I also discussed with the patient that there may be a patient responsible charge related to this service. The patient expressed understanding and agreed to proceed. The patient's location is home. I am at home.  Discussed with patient ongoing back issues and pending back injection.  Reviewed Eliquis and stopping Eliquis 3 days prior to back injection.  Patient indicates and understands. Taking metformin 2 a day without problems except while taking prednisone blood sugar went up and started 3 a day with good control of his blood sugar. Blood pressure seems to be doing well without problems. Reflux also appears stable.  Relevant past medical, surgical, family and social history reviewed and updated as indicated. Interim medical history since our last visit reviewed. Allergies and medications reviewed and updated.  Review of Systems  Constitutional: Negative.   Respiratory: Negative.   Cardiovascular: Negative.     Per HPI unless specifically indicated above     Objective:    There were no vitals taken for this visit.  Wt Readings from Last 3 Encounters:  04/29/18 189 lb 6 oz (85.9 kg)  04/19/18 189 lb 6.4 oz (85.9 kg)  04/01/18 194 lb (88 kg)    Physical Exam  Results for orders placed or performed during the hospital encounter of 04/29/18  PT- INR Day of Surgery   Result Value Ref Range   Prothrombin Time 12.4 11.4 - 15.2 seconds   INR 0.9 0.8 - 1.2  Glucose, capillary  Result Value Ref Range   Glucose-Capillary 141 (H) 70 - 99 mg/dL  Glucose, capillary  Result Value Ref Range   Glucose-Capillary 118 (H) 70 - 99 mg/dL  Glucose, capillary  Result Value Ref Range   Glucose-Capillary 146 (H) 70 - 99 mg/dL   Comment 1 Notify RN    Comment 2 Document in Chart   Glucose, capillary  Result Value Ref Range   Glucose-Capillary 269 (H) 70 - 99 mg/dL   Comment 1 Notify RN    Comment 2 Document in Chart   Glucose, capillary  Result Value Ref Range   Glucose-Capillary 207 (H) 70 - 99 mg/dL   Comment 1 Notify RN    Comment 2 Document in Chart   Glucose, capillary  Result Value Ref Range   Glucose-Capillary 175 (H) 70 - 99 mg/dL   Comment 1 Notify RN    Comment 2 Document in Chart       Assessment & Plan:   Problem List Items Addressed This Visit      Cardiovascular and Mediastinum   Atrial fibrillation ([place])    The current medical regimen is effective;  continue present plan and medications.         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         Musculoskeletal and  Integument   Lumbar degenerative disc disease    Has had surgical procedure with some temporary relief now having problems again with injection scheduled later next week.  Okay to hold Eliquis 3 days prior to injection.        I discussed the assessment and treatment plan with the patient. The patient was provided an opportunity to ask questions and all were answered. The patient agreed with the plan and demonstrated an understanding of the instructions.   The patient was advised to call back or seek an in-person evaluation if the symptoms worsen or if the condition fails to improve as anticipated.   I provided 21+ minutes of time during this encounter.   Follow up plan: Return in about 6 months (around 01/10/2019) for Physical Exam.

## 2018-07-12 ENCOUNTER — Telehealth: Payer: Self-pay

## 2018-07-17 DIAGNOSIS — M461 Sacroiliitis, not elsewhere classified: Secondary | ICD-10-CM | POA: Diagnosis not present

## 2018-07-19 ENCOUNTER — Encounter: Payer: Self-pay | Admitting: Nurse Practitioner

## 2018-07-19 ENCOUNTER — Ambulatory Visit: Payer: Self-pay | Admitting: *Deleted

## 2018-07-19 ENCOUNTER — Telehealth: Payer: Self-pay

## 2018-07-19 ENCOUNTER — Encounter: Payer: Self-pay | Admitting: Emergency Medicine

## 2018-07-19 ENCOUNTER — Ambulatory Visit (INDEPENDENT_AMBULATORY_CARE_PROVIDER_SITE_OTHER): Payer: Medicare Other | Admitting: Nurse Practitioner

## 2018-07-19 ENCOUNTER — Other Ambulatory Visit: Payer: Self-pay

## 2018-07-19 ENCOUNTER — Inpatient Hospital Stay
Admission: EM | Admit: 2018-07-19 | Discharge: 2018-07-23 | DRG: 372 | Disposition: A | Discharge status: Home / Self Care | Payer: Medicare Other | Attending: Internal Medicine | Admitting: Internal Medicine

## 2018-07-19 DIAGNOSIS — Z881 Allergy status to other antibiotic agents status: Secondary | ICD-10-CM

## 2018-07-19 DIAGNOSIS — Z87892 Personal history of anaphylaxis: Secondary | ICD-10-CM

## 2018-07-19 DIAGNOSIS — Z888 Allergy status to other drugs, medicaments and biological substances status: Secondary | ICD-10-CM | POA: Diagnosis not present

## 2018-07-19 DIAGNOSIS — H9190 Unspecified hearing loss, unspecified ear: Secondary | ICD-10-CM | POA: Diagnosis present

## 2018-07-19 DIAGNOSIS — G4733 Obstructive sleep apnea (adult) (pediatric): Secondary | ICD-10-CM | POA: Diagnosis present

## 2018-07-19 DIAGNOSIS — A020 Salmonella enteritis: Secondary | ICD-10-CM | POA: Diagnosis not present

## 2018-07-19 DIAGNOSIS — R197 Diarrhea, unspecified: Secondary | ICD-10-CM

## 2018-07-19 DIAGNOSIS — Z91048 Other nonmedicinal substance allergy status: Secondary | ICD-10-CM

## 2018-07-19 DIAGNOSIS — Z974 Presence of external hearing-aid: Secondary | ICD-10-CM

## 2018-07-19 DIAGNOSIS — N179 Acute kidney failure, unspecified: Secondary | ICD-10-CM | POA: Diagnosis present

## 2018-07-19 DIAGNOSIS — K219 Gastro-esophageal reflux disease without esophagitis: Secondary | ICD-10-CM | POA: Diagnosis present

## 2018-07-19 DIAGNOSIS — I480 Paroxysmal atrial fibrillation: Secondary | ICD-10-CM | POA: Diagnosis present

## 2018-07-19 DIAGNOSIS — Z87442 Personal history of urinary calculi: Secondary | ICD-10-CM

## 2018-07-19 DIAGNOSIS — Z20828 Contact with and (suspected) exposure to other viral communicable diseases: Secondary | ICD-10-CM | POA: Diagnosis present

## 2018-07-19 DIAGNOSIS — Z91013 Allergy to seafood: Secondary | ICD-10-CM

## 2018-07-19 DIAGNOSIS — N183 Chronic kidney disease, stage 3 (moderate): Secondary | ICD-10-CM | POA: Diagnosis present

## 2018-07-19 DIAGNOSIS — Z79899 Other long term (current) drug therapy: Secondary | ICD-10-CM

## 2018-07-19 DIAGNOSIS — M1990 Unspecified osteoarthritis, unspecified site: Secondary | ICD-10-CM | POA: Diagnosis present

## 2018-07-19 DIAGNOSIS — Z7984 Long term (current) use of oral hypoglycemic drugs: Secondary | ICD-10-CM | POA: Diagnosis not present

## 2018-07-19 DIAGNOSIS — A029 Salmonella infection, unspecified: Secondary | ICD-10-CM | POA: Diagnosis present

## 2018-07-19 DIAGNOSIS — R112 Nausea with vomiting, unspecified: Secondary | ICD-10-CM | POA: Diagnosis not present

## 2018-07-19 DIAGNOSIS — R000 Tachycardia, unspecified: Secondary | ICD-10-CM | POA: Diagnosis not present

## 2018-07-19 DIAGNOSIS — Z7901 Long term (current) use of anticoagulants: Secondary | ICD-10-CM

## 2018-07-19 DIAGNOSIS — E876 Hypokalemia: Secondary | ICD-10-CM | POA: Diagnosis not present

## 2018-07-19 DIAGNOSIS — E860 Dehydration: Secondary | ICD-10-CM | POA: Diagnosis present

## 2018-07-19 DIAGNOSIS — I129 Hypertensive chronic kidney disease with stage 1 through stage 4 chronic kidney disease, or unspecified chronic kidney disease: Secondary | ICD-10-CM | POA: Diagnosis present

## 2018-07-19 DIAGNOSIS — E782 Mixed hyperlipidemia: Secondary | ICD-10-CM | POA: Diagnosis not present

## 2018-07-19 DIAGNOSIS — Z981 Arthrodesis status: Secondary | ICD-10-CM | POA: Diagnosis not present

## 2018-07-19 DIAGNOSIS — Z808 Family history of malignant neoplasm of other organs or systems: Secondary | ICD-10-CM

## 2018-07-19 DIAGNOSIS — E119 Type 2 diabetes mellitus without complications: Secondary | ICD-10-CM | POA: Diagnosis not present

## 2018-07-19 DIAGNOSIS — I4891 Unspecified atrial fibrillation: Secondary | ICD-10-CM | POA: Diagnosis not present

## 2018-07-19 DIAGNOSIS — E1122 Type 2 diabetes mellitus with diabetic chronic kidney disease: Secondary | ICD-10-CM | POA: Diagnosis present

## 2018-07-19 LAB — GASTROINTESTINAL PANEL BY PCR, STOOL (REPLACES STOOL CULTURE)

## 2018-07-19 LAB — C DIFFICILE QUICK SCREEN W PCR REFLEX
C Diff antigen: NEGATIVE
C Diff interpretation: NOT DETECTED
C Diff toxin: NEGATIVE

## 2018-07-19 LAB — URINALYSIS, COMPLETE (UACMP) WITH MICROSCOPIC
Bilirubin Urine: NEGATIVE
Glucose, UA: 150 mg/dL — AB
Hgb urine dipstick: NEGATIVE
Ketones, ur: NEGATIVE mg/dL
Leukocytes,Ua: NEGATIVE
Nitrite: NEGATIVE
Protein, ur: 100 mg/dL — AB
Specific Gravity, Urine: 1.035 — ABNORMAL HIGH (ref 1.005–1.030)
pH: 5 (ref 5.0–8.0)

## 2018-07-19 LAB — COMPREHENSIVE METABOLIC PANEL
ALT: 15 U/L (ref 0–44)
AST: 22 U/L (ref 15–41)
Albumin: 3.7 g/dL (ref 3.5–5.0)
Alkaline Phosphatase: 81 U/L (ref 38–126)
Anion gap: 13 (ref 5–15)
BUN: 21 mg/dL (ref 8–23)
CO2: 21 mmol/L — ABNORMAL LOW (ref 22–32)
Calcium: 8.8 mg/dL — ABNORMAL LOW (ref 8.9–10.3)
Chloride: 101 mmol/L (ref 98–111)
Creatinine, Ser: 1.15 mg/dL (ref 0.61–1.24)
GFR calc Af Amer: >60 mL/min (ref >60)
GFR calc non Af Amer: >60 mL/min (ref >60)
Glucose, Bld: 245 mg/dL — ABNORMAL HIGH (ref 70–99)
Potassium: 3.5 mmol/L (ref 3.5–5.1)
Sodium: 135 mmol/L (ref 135–145)
Total Bilirubin: 0.7 mg/dL (ref 0.3–1.2)
Total Protein: 7.3 g/dL (ref 6.5–8.1)

## 2018-07-19 LAB — GLUCOSE, CAPILLARY
Glucose-Capillary: 210 mg/dL — ABNORMAL HIGH (ref 70–99)
Glucose-Capillary: 225 mg/dL — ABNORMAL HIGH (ref 70–99)

## 2018-07-19 LAB — CBC
HCT: 39.7 % (ref 39.0–52.0)
Hemoglobin: 13.4 g/dL (ref 13.0–17.0)
MCH: 30.5 pg (ref 26.0–34.0)
MCHC: 33.8 g/dL (ref 30.0–36.0)
MCV: 90.4 fL (ref 80.0–100.0)
Platelets: 163 10*3/uL (ref 150–400)
RBC: 4.39 MIL/uL (ref 4.22–5.81)
RDW: 14.4 % (ref 11.5–15.5)
WBC: 4.1 10*3/uL (ref 4.0–10.5)
nRBC: 0 % (ref 0.0–0.2)

## 2018-07-19 LAB — LIPASE, BLOOD: Lipase: 22 U/L (ref 11–51)

## 2018-07-19 LAB — SARS CORONAVIRUS 2 BY RT PCR (HOSPITAL ORDER, PERFORMED IN ~~LOC~~ HOSPITAL LAB): SARS Coronavirus 2: NEGATIVE

## 2018-07-19 LAB — HEMOGLOBIN A1C
Hgb A1c MFr Bld: 6.7 % — ABNORMAL HIGH (ref 4.8–5.6)
Mean Plasma Glucose: 145.59 mg/dL

## 2018-07-19 MED ORDER — ONDANSETRON HCL 4 MG PO TABS: 4.0000 mg | ORAL_TABLET | Freq: Four times a day (QID) | ORAL | Status: DC | PRN

## 2018-07-19 MED ORDER — PROPAFENONE HCL ER 425 MG PO CP12
425.0000 mg | ORAL_CAPSULE | Freq: Two times a day (BID) | ORAL | Status: DC
  Filled 2018-07-19: qty 1

## 2018-07-19 MED ORDER — PROPAFENONE HCL ER 225 MG PO CP12
450.0000 mg | ORAL_CAPSULE | Freq: Two times a day (BID) | ORAL | Status: DC
  Administered 2018-07-20 – 2018-07-23 (×8): 450 mg via ORAL
  Filled 2018-07-19 (×8): qty 2

## 2018-07-19 MED ORDER — PANTOPRAZOLE SODIUM 40 MG PO TBEC
40.0000 mg | DELAYED_RELEASE_TABLET | Freq: Every day | ORAL | Status: DC
  Administered 2018-07-20 – 2018-07-23 (×4): 40 mg via ORAL
  Filled 2018-07-19 (×4): qty 1

## 2018-07-19 MED ORDER — SODIUM CHLORIDE 0.9 % IV BOLUS
500.0000 mL | Freq: Once | INTRAVENOUS | Status: AC
  Administered 2018-07-19: 500 mL via INTRAVENOUS

## 2018-07-19 MED ORDER — INSULIN ASPART 100 UNIT/ML ~~LOC~~ SOLN
0.0000 [IU] | Freq: Three times a day (TID) | SUBCUTANEOUS | Status: DC
  Administered 2018-07-20 (×3): 2 [IU] via SUBCUTANEOUS
  Administered 2018-07-21: 3 [IU] via SUBCUTANEOUS
  Administered 2018-07-21 – 2018-07-23 (×6): 2 [IU] via SUBCUTANEOUS
  Filled 2018-07-19 (×11): qty 1

## 2018-07-19 MED ORDER — AZITHROMYCIN 500 MG PO TABS
1000.0000 mg | ORAL_TABLET | Freq: Once | ORAL | Status: AC
  Administered 2018-07-20: 1000 mg via ORAL
  Filled 2018-07-19: qty 2

## 2018-07-19 MED ORDER — ACETAMINOPHEN 650 MG RE SUPP: 650.0000 mg | Freq: Four times a day (QID) | RECTAL | Status: DC | PRN

## 2018-07-19 MED ORDER — AZITHROMYCIN 500 MG PO TABS
500.0000 mg | ORAL_TABLET | Freq: Every day | ORAL | Status: DC
  Administered 2018-07-20 – 2018-07-23 (×4): 500 mg via ORAL
  Filled 2018-07-19 (×4): qty 1

## 2018-07-19 MED ORDER — DIPHENOXYLATE-ATROPINE 2.5-0.025 MG PO TABS
2.0000 | ORAL_TABLET | Freq: Once | ORAL | Status: DC
  Filled 2018-07-19: qty 2

## 2018-07-19 MED ORDER — DILTIAZEM HCL 25 MG/5ML IV SOLN
15.0000 mg | Freq: Once | INTRAVENOUS | Status: AC
  Administered 2018-07-19: 15 mg via INTRAVENOUS
  Filled 2018-07-19: qty 5

## 2018-07-19 MED ORDER — ACETAMINOPHEN 325 MG PO TABS: 650.0000 mg | ORAL_TABLET | Freq: Four times a day (QID) | ORAL | Status: DC | PRN

## 2018-07-19 MED ORDER — ONDANSETRON HCL 4 MG/2ML IJ SOLN
4.0000 mg | Freq: Four times a day (QID) | INTRAMUSCULAR | Status: DC | PRN
  Administered 2018-07-20: 4 mg via INTRAVENOUS
  Filled 2018-07-19: qty 2

## 2018-07-19 MED ORDER — BENAZEPRIL HCL 20 MG PO TABS
40.0000 mg | ORAL_TABLET | Freq: Every day | ORAL | Status: DC
  Administered 2018-07-20 – 2018-07-21 (×2): 40 mg via ORAL
  Filled 2018-07-19 (×2): qty 2

## 2018-07-19 MED ORDER — SODIUM CHLORIDE 0.9% FLUSH: 3.0000 mL | Freq: Once | INTRAVENOUS | Status: DC

## 2018-07-19 MED ORDER — APIXABAN 5 MG PO TABS
5.0000 mg | ORAL_TABLET | Freq: Two times a day (BID) | ORAL | Status: DC
  Administered 2018-07-20 – 2018-07-23 (×8): 5 mg via ORAL
  Filled 2018-07-19 (×8): qty 1

## 2018-07-19 MED ORDER — METOPROLOL SUCCINATE ER 25 MG PO TB24
25.0000 mg | ORAL_TABLET | Freq: Two times a day (BID) | ORAL | Status: DC
  Administered 2018-07-20 – 2018-07-23 (×8): 25 mg via ORAL
  Filled 2018-07-19 (×8): qty 1

## 2018-07-19 MED ORDER — DIPHENOXYLATE-ATROPINE 2.5-0.025 MG PO TABS
1.0000 | ORAL_TABLET | Freq: Once | ORAL | Status: AC
  Administered 2018-07-19: 1 via ORAL
  Filled 2018-07-19: qty 1

## 2018-07-19 MED ORDER — INSULIN ASPART 100 UNIT/ML ~~LOC~~ SOLN
0.0000 [IU] | Freq: Every day | SUBCUTANEOUS | Status: DC
  Administered 2018-07-20 – 2018-07-22 (×2): 2 [IU] via SUBCUTANEOUS
  Filled 2018-07-19: qty 1

## 2018-07-19 MED ORDER — FINASTERIDE 5 MG PO TABS
5.0000 mg | ORAL_TABLET | Freq: Every day | ORAL | Status: DC
  Administered 2018-07-20 – 2018-07-22 (×4): 5 mg via ORAL
  Filled 2018-07-19 (×4): qty 1

## 2018-07-19 MED ORDER — POTASSIUM CHLORIDE IN NACL 20-0.9 MEQ/L-% IV SOLN
INTRAVENOUS | Status: AC
  Administered 2018-07-19 – 2018-07-20 (×3): via INTRAVENOUS
  Filled 2018-07-19 (×3): qty 1000

## 2018-07-19 NOTE — Progress Notes (Signed)
Advance care planning  Purpose of Encounter Salmonella diarrhea  Parties in Attendance Patient  Patients Decisional capacity Alert and oriented.  Able to make medical decisions.  His healthcare power of attorney is his wife.  Patient has living will.  Discussed in detail regarding normal Diarrhea.  Treatment plan , prognosis discussed.  All questions answered  CODE STATUS discussed.  Patient requests aggressive medical care with ventilation/defibrillation/CPR if needed.  Orders entered and CODE STATUS changed  Full code  Time spent - 17 minutes

## 2018-07-19 NOTE — Telephone Encounter (Signed)
Pt and his daughter, Lavella Lemons, called stating that he hash diarrhea, fatigue, vomiting and loss of taste all of which started 07/18/2018; he has vomited 5 times in the past 24 hours, and had >10 episodes of watery diarrhea; the pt denies fever, SOB, or cough; he wears a mask and is social distancing; the pt has only had a tomato sandwich since yesterday; his last cbg 210; he had 16 oz of gatorade and 24 oz of water; his temp was 99.6 07/18/2018 at 1830; he answers no to all questions of [facility]; recommendations made per nurse triage protocol; they verbalize understanding, and are waiting for call back from the office; they can be contacted at [phone]; will route to office for notification.    Reason for Disposition . HIGH RISK patient (e.g., age > 20 years, diabetes, heart or lung disease, weak immune system)  Answer Assessment - Initial Assessment Questions 1. COVID-19 DIAGNOSIS: "Who made your Coronavirus (COVID-19) diagnosis?" "Was it confirmed by a positive lab test?" If not diagnosed by a HCP, ask "Are there lots of cases (community spread) where you live?" (See public health department website, if unsure)     Major community spread 2. ONSET: "When did the COVID-19 symptoms start?"      07/18/2018 3. WORST SYMPTOM: "What is your worst symptom?" (e.g., cough, fever, shortness of breath, muscle aches)     diarhea 4. COUGH: "Do you have a cough?" If so, ask: "How bad is the cough?"       Usual cough due to allergies 5. FEVER: "Do you have a fever?" If so, ask: "What is your temperature, how was it measured, and when did it start?"    99.6 oral 6. RESPIRATORY STATUS: "Describe your breathing?" (e.g., shortness of breath, wheezing, unable to speak)     no 7. BETTER-SAME-WORSE: "Are you getting better, staying the same or getting worse compared to yesterday?"  If getting worse, ask, "In what way?"   worse 8. HIGH RISK DISEASE: "Do you have any chronic medical problems?" (e.g., asthma, heart  or lung disease, weak immune system, etc.)     diabetes 9. PREGNANCY: "Is there any chance you are pregnant?" "When was your last menstrual period?"     n/a 10. OTHER SYMPTOMS: "Do you have any other symptoms?"  (e.g., chills, fatigue, headache, loss of smell or taste, muscle pain, sore throat)      Loss of taste, diarrhea, vomiting, fatigue  Protocols used: CORONAVIRUS (COVID-19) DIAGNOSED OR SUSPECTED-A-AH

## 2018-07-19 NOTE — Progress Notes (Signed)
Temp 98.8 F (37.1 C) (Oral)   Ht 6\' 1"  (1.854 m)   Wt 190 lb (86.2 kg)   BMI 25.07 kg/m    Subjective:    Patient ID: Sean Reyes., male    DOB: 02-May-1940, 78 y.o.   MRN: [id number]  HPI: Sean Reyes. is a 78 y.o. male  Chief Complaint  Patient presents with  . Diarrhea    since yesterday morning. tried OTC imodium  . Emesis  . Fatigue   DIARRHEA/EMESIS/FATIGUE Daughter and wife present at bedside to assist with history.  Patient with diagnosis of A-fib, T2DM, HTN, CKD.  Currently on Metformin and Eliquis + Norvasc, Lotensin, Propafenone, and Metoprolol.  Does take Protonix for GI protection.  Recent PLIF L5-S1 surgery 04/29/2018.  Last A1C 6.8 on 04/19/2018 and last CBC in November 2019 H/H 12.1/35.7 with MCV 90.  Started with diarrhea yesterday morning.  Has had 1-2 episodes, if not more, of diarrhea an hour.  He is also having emesis, has had about 4 episodes.  Prior to this he reports regular BM pattern, approx twice a day formed stool.  Had one episode of pain in abdomen, at about 7 pm last night.  At about 0230 they were going to call ambulance because he felt "really bad", very weak.  Has had one tomato sandwich, but nothing else to eat since yesterday.  Has been taking in Gatorade zero, water, and popsicle.  Since 6 pm last night has taken in about 40 oz between Gatorade and water, maybe less.  Is so weak and is not able to get out of bed, has been needing "a lot of help" to get up and down. His daughter reports he has not healed well from back surgery in March, had Cortisone shot Wednesday, and that is one cause of weakness per his daughter's report.  But she reports this is "sliding down hill very quickly" with this current illness.  Has been taking liquid Imodium, without relief.  Took sugar at 1822 last night and was 210, which is "very high for him".  This morning 224 while on phone with provider.  Often runs 125-130.  Denies abdominal pain. Alleviating factors:  nothing  Aggravating factors: unknown Status: fluctuating Treatments attempted: takes PPI on regular basis Fever: no Nausea: yes Vomiting: yes Weight loss: no Decreased appetite: no Diarrhea: yes Constipation: no Blood in stool: no Heartburn: no Jaundice: no Rash: no Dysuria/urinary frequency: no Hematuria: no History of sexually transmitted disease: no Recurrent NSAID use: no  Relevant past medical, surgical, family and social history reviewed and updated as indicated. Interim medical history since our last visit reviewed. Allergies and medications reviewed and updated.  Review of Systems  Constitutional: Positive for fatigue. Negative for activity change, chills, diaphoresis and fever.  Respiratory: Negative for cough, chest tightness, shortness of breath and wheezing.   Cardiovascular: Negative for chest pain, palpitations and leg swelling.  Gastrointestinal: Positive for diarrhea, nausea and vomiting. Negative for abdominal distention, abdominal pain, blood in stool and constipation.  Endocrine: Negative for cold intolerance, heat intolerance, polydipsia, polyphagia and polyuria.  Genitourinary: Negative for decreased urine volume, difficulty urinating, dysuria, flank pain and hematuria.  Musculoskeletal: Negative.   Skin: Positive for pallor.  Neurological: Positive for weakness. Negative for dizziness, syncope, light-headedness, numbness and headaches.  Psychiatric/Behavioral: Negative.     Per HPI unless specifically indicated above     Objective:    Temp 98.8 F (37.1 C) (Oral)   Ht 6\' 1"  (1.854  m)   Wt 190 lb (86.2 kg)   BMI 25.07 kg/m   Wt Readings from Last 3 Encounters:  07/19/18 190 lb (86.2 kg)  04/29/18 189 lb 6 oz (85.9 kg)  04/19/18 189 lb 6.4 oz (85.9 kg)    Physical Exam Vitals signs and nursing note reviewed.  Constitutional:      General: He is awake. He is not in acute distress.    Appearance: He is well-developed. He is ill-appearing.  HENT:      Head: Normocephalic.     Right Ear: Hearing normal. No drainage.     Left Ear: Hearing normal. No drainage.     Mouth/Throat:     Mouth: Mucous membranes are dry.     Comments: Mouth and lips very dry via visual exam. Eyes:     General: Lids are normal.        Right eye: No discharge.        Left eye: No discharge.     Conjunctiva/sclera: Conjunctivae normal.  Neck:     Musculoskeletal: Normal range of motion.  Cardiovascular:     Comments: Unable to auscultate due to virtual exam only  Pulmonary:     Effort: Pulmonary effort is normal. No accessory muscle usage or respiratory distress.     Comments: Unable to auscultate due to virtual exam only Abdominal:     Palpations: Abdomen is soft.     Tenderness: There is no abdominal tenderness.     Comments: Patient performed self palpation via virtual visit.  Denies abdominal tenderness and reports soft abdomen.  Abdomen appears flat on visual exam.  Unable to auscultate due to virtual exam only.  Skin:    Coloration: Skin is pale.     Comments: Skin pale via visual exam and daughter reports he is paler than his baseline.  Neurological:     Mental Status: He is alert and oriented to person, place, and time.  Psychiatric:        Mood and Affect: Mood normal.        Behavior: Behavior normal. Behavior is cooperative.        Thought Content: Thought content normal.        Judgment: Judgment normal.     Results for orders placed or performed during the hospital encounter of 04/29/18  PT- INR Day of Surgery  Result Value Ref Range   Prothrombin Time 12.4 11.4 - 15.2 seconds   INR 0.9 0.8 - 1.2  Glucose, capillary  Result Value Ref Range   Glucose-Capillary 141 (H) 70 - 99 mg/dL  Glucose, capillary  Result Value Ref Range   Glucose-Capillary 118 (H) 70 - 99 mg/dL  Glucose, capillary  Result Value Ref Range   Glucose-Capillary 146 (H) 70 - 99 mg/dL   Comment 1 Notify RN    Comment 2 Document in Chart   Glucose, capillary   Result Value Ref Range   Glucose-Capillary 269 (H) 70 - 99 mg/dL   Comment 1 Notify RN    Comment 2 Document in Chart   Glucose, capillary  Result Value Ref Range   Glucose-Capillary 207 (H) 70 - 99 mg/dL   Comment 1 Notify RN    Comment 2 Document in Chart   Glucose, capillary  Result Value Ref Range   Glucose-Capillary 175 (H) 70 - 99 mg/dL   Comment 1 Notify RN    Comment 2 Document in Chart       Assessment & Plan:   Problem List  Items Addressed This Visit      Other   Diarrhea    Possible infectious cause, appears ill on virtual exam and has had only 40 oz oral fluid intake since yesterday evening with visual exam noting signs of dehydration.  Due to co morbidities, including diabetes on Metformin, and risk for AKI have recommended face to face visit at ER today for further work-up and possible IV hydration.  His family and him are able to repeat this plan of care back to provider and agree with plan of care.  Will call triage nurse at ER and provide report.            Follow up plan: Return if symptoms worsen or fail to improve.

## 2018-07-19 NOTE — ED Notes (Signed)
Pt assisted back to bed. Rest of EMS 500cc bolus continued per PA verbal order.

## 2018-07-19 NOTE — ED Triage Notes (Signed)
Pt to ED by EMS with c/o of N/V/D that started yesterday at approx 12N. Pt denies contact with any that has been sick.

## 2018-07-19 NOTE — ED Notes (Signed)
Assisted pt with using the bathroom. Pt had a bowel movement.

## 2018-07-19 NOTE — ED Notes (Signed)
Pt assisted to use bedside toilet

## 2018-07-19 NOTE — Assessment & Plan Note (Signed)
Possible infectious cause, appears ill on virtual exam and has had only 40 oz oral fluid intake since yesterday evening with visual exam noting signs of dehydration.  Due to co morbidities, including diabetes on Metformin, and risk for AKI have recommended face to face visit at ER today for further work-up and possible IV hydration.  His family and him are able to repeat this plan of care back to provider and agree with plan of care.  Will call triage nurse at ER and provide report.

## 2018-07-19 NOTE — Patient Instructions (Signed)

## 2018-07-19 NOTE — ED Provider Notes (Signed)
[facility] Emergency Department Provider Note  ____________________________________________   None    (approximate)  I have reviewed the triage vital signs and the nursing notes.   HISTORY  Chief Complaint Nausea; Emesis; and Diarrhea    HPI Sean Reyes. is a 78 y.o. male presents emergency department stating that he has had diarrhea since yesterday.  States been very watery, he had 3 episodes of vomiting.  He states no one else at home has been sick.  He has not been on any antibiotics.  He is unsure that he had bad food he states he had a bowl of cereal in the morning and then a tomato sandwich at lunch.  The tomato  was bought at a store.   He denies fever chills.  He states he feels dehydrated.  He states his heart rate he can tell is elevated but he does not have any chest pain or shortness of breath.  He has a history of atrial fib and tachycardia.  He has had 2 ablations for the atrial fib.   Past Medical History:  Diagnosis Date  . Anemia   . Anxiety   . Arthritis   . Atrial fibrillation ([place])   . Cataracts, bilateral   . Complication of anesthesia    pt reports low BP's after surgery at [facility] and difficulty awakening  . Depression   . Diabetes ([place])    dx 6-8 yrs ago  . Dysrhythmia    a-fib  . GERD (gastroesophageal reflux disease)    OCC TAKES ALKA SELTZER  . History of kidney stones    10-15 yrs ago  . 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 Hyperlipidemia   . Hypertension   . Nocturia   . S/P ablation of atrial fibrillation    Ablative therapy  . Sleep apnea    CPAP   . Tachycardia, unspecified     Patient Active Problem List   Diagnosis Date Noted  . Diarrhea 07/19/2018  . HNP (herniated nucleus pulposus), lumbar 04/29/2018  . Neurologic orthostatic hypotension ([place]) 06/29/2017  . Advanced care planning/counseling discussion 09/28/2016  . Spinal stenosis of lumbar region 09/13/2016  . Low  testosterone 10/07/2015  . Mixed hyperlipidemia 07/14/2015  . Anemia 06/30/2015  . 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 OSA (obstructive sleep apnea) 03/23/2015  . 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 Lumbar degenerative disc disease 12/23/2014  . Chronic anticoagulation 10/26/2014  . Anxiety 10/13/2014  . 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 Atrial fibrillation ([place]) 07/27/2014  . 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 Arthritis 10/20/2011  . H/O prior ablation treatment 10/19/2011  . Paroxysmal atrial fibrillation ([place]) 10/19/2011    Past Surgical History:  Procedure Laterality Date  . ABLATION    . ANTERIOR LAT LUMBAR FUSION N/A 06/27/2017   Procedure: Anterior Lateral Lumbar Interbody  Fusion - Lumbar Two-Lumbar Three - Lumbar Three-Lumbar Four, Posterior Lumbar Interbody Fusion Lumbar Four- Five;  Surgeon: Kary Kos, MD;  Location: [place];  Service: Neurosurgery;  Laterality: N/A;  Anterior Lateral Lumbar Interbody  Fusion - Lumbar Two-Lumbar Three - Lumbar Three-Lumbar Four, Posterior Lumbar Interbody Fusion Lumbar Four- Five  . BACK SURGERY    . COLONOSCOPY WITH PROPOFOL N/A 10/05/2015   Procedure: COLONOSCOPY WITH PROPOFOL;  Surgeon: Lollie Sails, MD;  Location[facility] ENDOSCOPY;  Service: Endoscopy;  Laterality: N/A;  . ESOPHAGOGASTRODUODENOSCOPY (EGD) WITH PROPOFOL  N/A 04/01/2018   Procedure: ESOPHAGOGASTRODUODENOSCOPY (EGD) WITH PROPOFOL;  Surgeon: Lollie Sails, MD;  Location: [facility] ENDOSCOPY;  Service: Endoscopy;  Laterality: N/A;  . HERNIA REPAIR    . JOINT REPLACEMENT Bilateral    hips  RT+  LEFT X2   . LUMBAR LAMINECTOMY/DECOMPRESSION MICRODISCECTOMY Left 09/13/2016   Procedure: Microdiscectomy - Lumbar two-three,  Lumbar three- - left;  Surgeon: Kary Kos, MD;  Location: [facility];  Service: Neurosurgery;  Laterality: Left;  . TONSILLECTOMY      Prior to Admission medications    Medication Sig Start Date End Date Taking? Authorizing Provider  amLODipine (NORVASC) 5 MG tablet Take 1 tablet (5 mg total) by mouth daily. 12/20/17  Yes Crissman, Jeannette How, MD  benazepril (LOTENSIN) 40 MG tablet Take 1 tablet (40 mg total) by mouth daily. 12/20/17  Yes Crissman, Jeannette How, MD  cetirizine (ZYRTEC) 10 MG tablet Take 10 mg by mouth daily.   Yes [provider]  ELIQUIS 5 MG TABS tablet Take 5 mg by mouth 2 (two) times daily.  06/05/18  Yes [provider]  finasteride (PROSCAR) 5 MG tablet Take 1 tablet (5 mg total) by mouth daily. 07/04/18  Yes Crissman, Jeannette How, MD  metFORMIN (GLUCOPHAGE-XR) 500 MG 24 hr tablet Take 2 tablets (1,000 mg total) by mouth 2 (two) times daily. 06/26/18  Yes Crissman, Jeannette How, MD  metoprolol succinate (TOPROL-XL) 25 MG 24 hr tablet Take 1 tablet (25 mg total) by mouth 2 (two) times daily. 12/20/17  Yes Crissman, Jeannette How, MD  pantoprazole (PROTONIX) 40 MG tablet Take 40 mg by mouth daily before breakfast.   Yes [provider]  propafenone (RYTHMOL SR) 425 MG 12 hr capsule Take 425 mg by mouth 2 (two) times daily.   Yes [provider]    Allergies Levaquin [levofloxacin in d5w]; Shellfish allergy; Amiodarone; and Adhesive [tape]  Family History  Problem Relation Age of Onset  . Brain cancer Mother   . Kidney disease Neg Hx   . Prostate cancer Neg Hx   . Kidney cancer Neg Hx   . Bladder Cancer Neg Hx     Social History Social History   Tobacco Use  . Smoking status: Never Smoker  . Smokeless tobacco: Never Used  Substance Use Topics  . Alcohol use: No  . Drug use: No    Review of Systems  Constitutional: No fever/chills Eyes: No visual changes. ENT: No sore throat. Respiratory: Denies cough Cardiovascular: Denies chest pain Gastrointestinal: 3 episodes of vomiting, multiple episodes of diarrhea Genitourinary: Negative for dysuria. Musculoskeletal: Negative for back pain. Skin: Negative for rash.     ____________________________________________   PHYSICAL EXAM:  VITAL SIGNS: ED Triage Vitals  Enc Vitals Group     BP 07/19/18 1121 136/62     Pulse Rate 07/19/18 1121 (!) 110     Resp 07/19/18 1121 17     Temp 07/19/18 1121 99.1 F (37.3 C)     Temp Source 07/19/18 1121 Oral     SpO2 07/19/18 1121 98 %     Weight 07/19/18 1122 190 lb (86.2 kg)     Height 07/19/18 1122 6\' 1"  (1.854 m)     Head Circumference --      Peak Flow --      Pain Score 07/19/18 1127 0     Pain Loc --      Pain Edu? --      Excl. in [place]? --     Constitutional:  Alert and oriented. Well appearing and in no acute distress. Eyes: Conjunctivae are normal.  Head: Atraumatic. Nose: No congestion/rhinnorhea. Mouth/Throat: Mucous membranes are moist.   Neck:  supple no lymphadenopathy noted Cardiovascular: Tachycardic, regular rhythm. Heart sounds are normal Respiratory: Normal respiratory effort.  No retractions, lungs c t a  Abd: soft nontender bs normal all 4 quad, negative Murphy sign GU: deferred Musculoskeletal: FROM all extremities, warm and well perfused Neurologic:  Normal speech and language.  Skin:  Skin is warm, dry and intact. No rash noted. Psychiatric: Mood and affect are normal. Speech and behavior are normal.  ____________________________________________   LABS (all labs ordered are listed, but only abnormal results are displayed)  Labs Reviewed  COMPREHENSIVE METABOLIC PANEL - Abnormal; Notable for the following components:      Result Value   CO2 21 (*)    Glucose, Bld 245 (*)    Calcium 8.8 (*)    All other components within normal limits  URINALYSIS, COMPLETE (UACMP) WITH MICROSCOPIC - Abnormal; Notable for the following components:   Color, Urine AMBER (*)    APPearance HAZY (*)    Specific Gravity, Urine 1.035 (*)    Glucose, UA 150 (*)    Protein, ur 100 (*)    Bacteria, UA RARE (*)    All other components within normal limits  GLUCOSE, CAPILLARY - Abnormal; Notable  for the following components:   Glucose-Capillary 210 (*)    All other components within normal limits  SARS CORONAVIRUS 2 (HOSPITAL ORDER, [place] [facility])  C DIFFICILE QUICK SCREEN W PCR REFLEX  GASTROINTESTINAL PANEL BY PCR, STOOL (REPLACES STOOL CULTURE)  LIPASE, BLOOD  CBC  LACTIC ACID, PLASMA  CBG MONITORING, ED   ____________________________________________   ____________________________________________  RADIOLOGY    ____________________________________________   PROCEDURES  Procedure(s) performed: Saline lock, normal saline 500 mg IV, Lomotil po,   Procedures    ____________________________________________   INITIAL IMPRESSION / ASSESSMENT AND PLAN / ED COURSE  Pertinent labs & imaging results that were available during my care of the patient were reviewed by me and considered in my medical decision making (see chart for details).   Patient 78 year old male presents emergency department with complaints of vomiting and diarrhea that started yesterday.  Physical exam patient is well and talkative.  However he is tachycardic with a low-grade temp at 99 1.  Abdomen is soft and nontender.  DDX: Viral gastroenteritis, COVID 19, tachycardia  CBC is normal, lipase normal, C. difficile is negative, comprehensive metabolic panel shows elevated glucose 245, urinalysis basically normal, COVID-19 test negative  We have been unable to decrease the amount of diarrhea the patient has.  He has had Lomotil 2 times today which decreased the symptoms mildly but the patient is constantly having watery diarrhea.  Stool samples were sent.  Due to the amount of diarrhea questionable dehydration secondary to the diarrhea patient be admitted.  Paged hospitalist.  Spoke with [facility] Dr. Darvin Neighbours.  Patient to be admitted in their care.     As part of my medical decision making, I reviewed the following data within the [place] notes  reviewed and incorporated, Labs reviewed see above, EKG interpreted tachycardia, Old chart reviewed, Discussed with admitting physician Dr. Darvin Neighbours, Evaluated by EM attending Jimmye Norman, Notes from prior ED visits and [place] Controlled Substance Database  ____________________________________________   FINAL CLINICAL IMPRESSION(S) / ED DIAGNOSES  Final diagnoses:  Diarrhea, unspecified type  Dehydration  Tachycardia  NEW MEDICATIONS STARTED DURING THIS VISIT:  New Prescriptions   No medications on file     Note:  This document was prepared using Dragon voice recognition software and may include unintentional dictation errors.    Versie Starks, PA-C 07/19/18 1951    Carrie Mew, MD 07/20/18 2330

## 2018-07-19 NOTE — H&P (Signed)
[facility] [facility] [place] NAME: Sean Reyes    MR#:  [id number]  DATE OF BIRTH:  [birth year]  DATE OF ADMISSION:  07/19/2018  PRIMARY CARE PHYSICIAN: [name], MD   REQUESTING/REFERRING PHYSICIAN: Dr. Archie Balboa  CHIEF COMPLAINT:   Chief Complaint  Patient presents with  . Nausea  . Emesis  . Diarrhea    HISTORY OF PRESENT ILLNESS:  Sean Reyes  is a 78 y.o. male with a known history of atrial fibrillation on Eliquis, hypertension, diabetes mellitus presents to the emergency room with watery diarrhea of 10-15 episodes per day since yesterday noon.  Patient also had 5 episodes of vomiting.  Does not have any abdominal pain.  No blood in stool or vomiting.  Afebrile and white count is normal.  Patient seems dehydrated.  Is very fatigued and dizzy.  C. difficile checked in the emergency room is negative.  Stool PCR positive for Salmonella.  PAST MEDICAL HISTORY:   Past Medical History:  Diagnosis Date  . Anemia   . Anxiety   . Arthritis   . Atrial fibrillation ([place])   . Cataracts, bilateral   . Complication of anesthesia    pt reports low BP's after surgery at [facility] and difficulty awakening  . Depression   . Diabetes ([place])    dx 6-8 yrs ago  . Dysrhythmia    a-fib  . GERD (gastroesophageal reflux disease)    OCC TAKES ALKA SELTZER  . History of kidney stones    10-15 yrs ago  . 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 Hyperlipidemia   . Hypertension   . Nocturia   . S/P ablation of atrial fibrillation    Ablative therapy  . Sleep apnea    CPAP   . Tachycardia, unspecified     PAST SURGICAL HISTORY:   Past Surgical History:  Procedure Laterality Date  . ABLATION    . ANTERIOR LAT LUMBAR FUSION N/A 06/27/2017   Procedure: Anterior Lateral Lumbar Interbody  Fusion - Lumbar Two-Lumbar Three - Lumbar Three-Lumbar Four, Posterior Lumbar Interbody Fusion Lumbar Four- Five;  Surgeon: Kary Kos, MD;   Location: [facility];  Service: Neurosurgery;  Laterality: N/A;  Anterior Lateral Lumbar Interbody  Fusion - Lumbar Two-Lumbar Three - Lumbar Three-Lumbar Four, Posterior Lumbar Interbody Fusion Lumbar Four- Five  . BACK SURGERY    . COLONOSCOPY WITH PROPOFOL N/A 10/05/2015   Procedure: COLONOSCOPY WITH PROPOFOL;  Surgeon: Lollie Sails, MD;  Location: [facility] ENDOSCOPY;  Service: Endoscopy;  Laterality: N/A;  . ESOPHAGOGASTRODUODENOSCOPY (EGD) WITH PROPOFOL N/A 04/01/2018   Procedure: ESOPHAGOGASTRODUODENOSCOPY (EGD) WITH PROPOFOL;  Surgeon: Lollie Sails, MD;  Location: [facility] ENDOSCOPY;  Service: Endoscopy;  Laterality: N/A;  . HERNIA REPAIR    . JOINT REPLACEMENT Bilateral    hips  RT+  LEFT X2   . LUMBAR LAMINECTOMY/DECOMPRESSION MICRODISCECTOMY Left 09/13/2016   Procedure: Microdiscectomy - Lumbar two-three,  Lumbar three- - left;  Surgeon: Kary Kos, MD;  Location: [place];  Service: Neurosurgery;  Laterality: Left;  . TONSILLECTOMY      SOCIAL HISTORY:   Social History   Tobacco Use  . Smoking status: Never Smoker  . Smokeless tobacco: Never Used  Substance Use Topics  . Alcohol use: No    FAMILY HISTORY:   Family History  Problem Relation Age of Onset  . Brain cancer Mother   . Kidney disease Neg Hx   . Prostate cancer Neg  Hx   . Kidney cancer Neg Hx   . Bladder Cancer Neg Hx     DRUG ALLERGIES:   Allergies  Allergen Reactions  . Levaquin [Levofloxacin In D5w] Anaphylaxis and Shortness Of Breath  . Shellfish Allergy Anaphylaxis    Has used duraprep, betadine and ioban in previous surgeries in 2019 and 2018 without issue  . Amiodarone Other (See Comments)    Tremors and thyroid toxicity  . Adhesive [Tape] Other (See Comments)    Little red bumps under the dressing.  He questions whether is latex related    REVIEW OF SYSTEMS:   Review of Systems  Constitutional: Positive for malaise/fatigue. Negative for chills, fever and weight loss.  HENT: Negative for hearing  loss and nosebleeds.   Eyes: Negative for blurred vision, double vision and pain.  Respiratory: Negative for cough, hemoptysis, sputum production, shortness of breath and wheezing.   Cardiovascular: Negative for chest pain, palpitations, orthopnea and leg swelling.  Gastrointestinal: Positive for diarrhea, nausea and vomiting. Negative for abdominal pain and constipation.  Genitourinary: Negative for dysuria and hematuria.  Musculoskeletal: Negative for back pain, falls and myalgias.  Skin: Negative for rash.  Neurological: Negative for dizziness, tremors, sensory change, speech change, focal weakness, seizures and headaches.  Endo/Heme/Allergies: Does not bruise/bleed easily.  Psychiatric/Behavioral: Negative for depression and memory loss. The patient is not nervous/anxious.     MEDICATIONS AT HOME:   Prior to Admission medications   Medication Sig Start Date End Date Taking? Authorizing Provider  amLODipine (NORVASC) 5 MG tablet Take 1 tablet (5 mg total) by mouth daily. 12/20/17  Yes Crissman, Jeannette How, MD  benazepril (LOTENSIN) 40 MG tablet Take 1 tablet (40 mg total) by mouth daily. 12/20/17  Yes Crissman, Jeannette How, MD  cetirizine (ZYRTEC) 10 MG tablet Take 10 mg by mouth daily.   Yes [provider]  ELIQUIS 5 MG TABS tablet Take 5 mg by mouth 2 (two) times daily.  06/05/18  Yes [provider]  finasteride (PROSCAR) 5 MG tablet Take 1 tablet (5 mg total) by mouth daily. 07/04/18  Yes Crissman, Jeannette How, MD  metFORMIN (GLUCOPHAGE-XR) 500 MG 24 hr tablet Take 2 tablets (1,000 mg total) by mouth 2 (two) times daily. 06/26/18  Yes Crissman, Jeannette How, MD  metoprolol succinate (TOPROL-XL) 25 MG 24 hr tablet Take 1 tablet (25 mg total) by mouth 2 (two) times daily. 12/20/17  Yes Crissman, Jeannette How, MD  pantoprazole (PROTONIX) 40 MG tablet Take 40 mg by mouth daily before breakfast.   Yes [provider]  propafenone (RYTHMOL SR) 425 MG 12 hr capsule Take 425 mg by mouth 2 (two)  times daily.   Yes [provider]     VITAL SIGNS:  Blood pressure 112/80, pulse (!) 106, temperature 99.1 F (37.3 C), temperature source Oral, resp. rate (!) 21, height 6\' 1"  (1.854 m), weight 86.2 kg, SpO2 95 %.  PHYSICAL EXAMINATION:  Physical Exam  GENERAL:  78 y.o.-year-old patient lying in the bed with no acute distress.  EYES: Pupils equal, round, reactive to light and accommodation. No scleral icterus. Extraocular muscles intact.  HEENT: Head atraumatic, normocephalic. Oropharynx and nasopharynx clear. No oropharyngeal erythema, dry oral mucosa  NECK:  Supple, no jugular venous distention. No thyroid enlargement, no tenderness.  LUNGS: Normal breath sounds bilaterally, no wheezing, rales, rhonchi. No use of accessory muscles of respiration.  CARDIOVASCULAR: S1, S2 normal. No murmurs, rubs, or gallops.  ABDOMEN: Soft, nontender, nondistended. Bowel sounds present. No  organomegaly or mass.  EXTREMITIES: No pedal edema, cyanosis, or clubbing. + 2 pedal & radial pulses b/l.   NEUROLOGIC: Cranial nerves II through XII are intact. No focal Motor or sensory deficits appreciated b/l PSYCHIATRIC: The patient is alert and oriented x 3. Good affect.  SKIN: No obvious rash, lesion, or ulcer.   LABORATORY PANEL:   CBC Recent Labs  Lab 07/19/18 1135  WBC 4.1  HGB 13.4  HCT 39.7  PLT 163   ------------------------------------------------------------------------------------------------------------------  Chemistries  Recent Labs  Lab 07/19/18 1135  NA 135  K 3.5  CL 101  CO2 21*  GLUCOSE 245*  BUN 21  CREATININE 1.15  CALCIUM 8.8*  AST 22  ALT 15  ALKPHOS 81  BILITOT 0.7   ------------------------------------------------------------------------------------------------------------------  Cardiac Enzymes No results for input(s): TROPONINI in the last 168  hours. ------------------------------------------------------------------------------------------------------------------  RADIOLOGY:  No results found.   IMPRESSION AND PLAN:   *Salmonella diarrhea.  Due to severe diarrhea and hospitalization will start patient on azithromycin.  He has allergy to Levaquin.  Start IV fluids.  No anti-diarrheal agents at this time.  No abdominal pain or tenderness.  Will need CT scan if any worsening.  *Dehydration.  Secondary to diarrhea.  Start IV fluids.  *Hypertension.  Continue metoprolol.  I ordered benazepril to start from tomorrow morning.  Held amlodipine.  *Atrial fibrillation.  Continue his A. fib medications along with Eliquis.  *Diabetes mellitus.  Sliding scale insulin ordered.  Diabetic diet  All the records are reviewed and case discussed with ED provider. Management plans discussed with the patient, family and they are in agreement.  CODE STATUS: Full code  TOTAL TIME TAKING CARE OF THIS PATIENT: 40 minutes.   Sean Reyes M.D on 07/19/2018 at 8:17 PM  Between 7am to 6pm - Pager - [phone]  After 6pm go to [email or web address] - password [email or web address] [place] [facility]  Office  [phone]  CC: Primary care physician; [name], MD  Note: This dictation was prepared with Dragon dictation along with smaller phrase technology. Any transcriptional errors that result from this process are unintentional.

## 2018-07-19 NOTE — ED Notes (Signed)
ED TO INPATIENT HANDOFF REPORT  ED Nurse Name and Phone #:  Maudie Mercury [phone]  S Name/Age/Gender Sean Reyes. 78 y.o. male Room/Bed: ED14A/ED14A  Code Status   Code Status: Full Code  Home/SNF/Other Home Patient oriented to: self, place, time and situation Is this baseline? Yes   Triage Complete: Triage complete  Chief Complaint nausea, vomiting  Triage Note Pt to ED by EMS with c/o of N/V/D that started yesterday at approx 12N. Pt denies contact with any that has been sick.    Allergies Allergies  Allergen Reactions  . Levaquin [Levofloxacin In D5w] Anaphylaxis and Shortness Of Breath  . Shellfish Allergy Anaphylaxis    Has used duraprep, betadine and ioban in previous surgeries in 2019 and 2018 without issue  . Amiodarone Other (See Comments)    Tremors and thyroid toxicity  . Adhesive [Tape] Other (See Comments)    Little red bumps under the dressing.  He questions whether is latex related    Level of Care/Admitting Diagnosis ED Disposition    ED Disposition Condition [place]: [place] [[id number]]  Level of Care: Med-Surg [16]  Covid Evaluation: N/A  Diagnosis: Salmonella food poisoning [097353]  Admitting Physician: Hillary Bow [[id number]]  Attending Physician: Hillary Bow [[id number]]  Estimated length of stay: past midnight tomorrow  Certification:: I certify this patient will need inpatient services for at least 2 midnights  PT Class (Do Not Modify): Inpatient [101]  PT Acc Code (Do Not Modify): Private [1]       B Medical/Surgery History Past Medical History:  Diagnosis Date  . Anemia   . Anxiety   . Arthritis   . Atrial fibrillation ([place])   . Cataracts, bilateral   . Complication of anesthesia    pt reports low BP's after surgery at [facility] and difficulty awakening  . Depression   . Diabetes ([place])    dx 6-8 yrs ago  . Dysrhythmia    a-fib  . GERD (gastroesophageal reflux disease)    OCC TAKES  ALKA SELTZER  . History of kidney stones    10-15 yrs ago  . 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 Hyperlipidemia   . Hypertension   . Nocturia   . S/P ablation of atrial fibrillation    Ablative therapy  . Sleep apnea    CPAP   . Tachycardia, unspecified    Past Surgical History:  Procedure Laterality Date  . ABLATION    . ANTERIOR LAT LUMBAR FUSION N/A 06/27/2017   Procedure: Anterior Lateral Lumbar Interbody  Fusion - Lumbar Two-Lumbar Three - Lumbar Three-Lumbar Four, Posterior Lumbar Interbody Fusion Lumbar Four- Five;  Surgeon: Kary Kos, MD;  Location: [facility];  Service: Neurosurgery;  Laterality: N/A;  Anterior Lateral Lumbar Interbody  Fusion - Lumbar Two-Lumbar Three - Lumbar Three-Lumbar Four, Posterior Lumbar Interbody Fusion Lumbar Four- Five  . BACK SURGERY    . COLONOSCOPY WITH PROPOFOL N/A 10/05/2015   Procedure: COLONOSCOPY WITH PROPOFOL;  Surgeon: Lollie Sails, MD;  Location: [facility] ENDOSCOPY;  Service: Endoscopy;  Laterality: N/A;  . ESOPHAGOGASTRODUODENOSCOPY (EGD) WITH PROPOFOL N/A 04/01/2018   Procedure: ESOPHAGOGASTRODUODENOSCOPY (EGD) WITH PROPOFOL;  Surgeon: Lollie Sails, MD;  Location: [facility] ENDOSCOPY;  Service: Endoscopy;  Laterality: N/A;  . HERNIA REPAIR    . JOINT REPLACEMENT Bilateral    hips  RT+  LEFT X2   . LUMBAR LAMINECTOMY/DECOMPRESSION MICRODISCECTOMY Left 09/13/2016   Procedure: Microdiscectomy - Lumbar  two-three,  Lumbar three- - left;  Surgeon: Kary Kos, MD;  Location: [place];  Service: Neurosurgery;  Laterality: Left;  . TONSILLECTOMY       A IV Location/Drains/Wounds Patient Lines/Drains/Airways Status   Active Line/Drains/Airways    Name:   Placement date:   Placement time:   Site:   Days:   Peripheral IV 07/19/18 Left Forearm   07/19/18    -    Forearm   less than 1   Closed System Drain   -    -    -      Airway 7.5 mm   04/29/18    0825     81   Incision (Closed) 04/29/18 Back Other (Comment)   04/29/18    1111      81          Intake/Output Last 24 hours  Intake/Output Summary (Last 24 hours) at 07/19/2018 2021 Last data filed at 07/19/2018 1130 Gross per 24 hour  Intake 250 ml  Output -  Net 250 ml    Labs/Imaging Results for orders placed or performed during the hospital encounter of 07/19/18 (from the past 48 hour(s))  Lipase, blood     Status: None   Collection Time: 07/19/18 11:35 AM  Result Value Ref Range   Lipase 22 11 - 51 U/L    Comment: Performed at [facility], [address] [place], [place] [address]  Comprehensive metabolic panel     Status: Abnormal   Collection Time: 07/19/18 11:35 AM  Result Value Ref Range   Sodium 135 135 - 145 mmol/L   Potassium 3.5 3.5 - 5.1 mmol/L   Chloride 101 98 - 111 mmol/L   CO2 21 (L) 22 - 32 mmol/L   Glucose, Bld 245 (H) 70 - 99 mg/dL   BUN 21 8 - 23 mg/dL   Creatinine, Ser 1.15 0.61 - 1.24 mg/dL   Calcium 8.8 (L) 8.9 - 10.3 mg/dL   Total Protein 7.3 6.5 - 8.1 g/dL   Albumin 3.7 3.5 - 5.0 g/dL   AST 22 15 - 41 U/L   ALT 15 0 - 44 U/L   Alkaline Phosphatase 81 38 - 126 U/L   Total Bilirubin 0.7 0.3 - 1.2 mg/dL   GFR calc non Af Amer >60 >60 mL/min   GFR calc Af Amer >60 >60 mL/min   Anion gap 13 5 - 15    Comment: Performed at [facility], [address] [place], [place] [address]  CBC     Status: None   Collection Time: 07/19/18 11:35 AM  Result Value Ref Range   WBC 4.1 4.0 - 10.5 K/uL   RBC 4.39 4.22 - 5.81 MIL/uL   Hemoglobin 13.4 13.0 - 17.0 g/dL   HCT 39.7 39.0 - 52.0 %   MCV 90.4 80.0 - 100.0 fL   MCH 30.5 26.0 - 34.0 pg   MCHC 33.8 30.0 - 36.0 g/dL   RDW 14.4 11.5 - 15.5 %   Platelets 163 150 - 400 K/uL   nRBC 0.0 0.0 - 0.2 %    Comment: Performed at [facility], [address], [place], [place] [address]  SARS Coronavirus 2 (CEPHEID- Performed in [place] hospital lab), Hosp Order     Status: None   Collection Time: 07/19/18 12:41 PM  Result Value Ref Range   SARS Coronavirus 2  NEGATIVE NEGATIVE    Comment: (NOTE) If result is NEGATIVE SARS-CoV-2 target nucleic acids  are NOT DETECTED. The SARS-CoV-2 RNA is generally detectable in upper and lower  respiratory specimens during the acute phase of infection. The lowest  concentration of SARS-CoV-2 viral copies this assay can detect is 250  copies / mL. A negative result does not preclude SARS-CoV-2 infection  and should not be used as the sole basis for treatment or other  patient management decisions.  A negative result may occur with  improper specimen collection / handling, submission of specimen other  than nasopharyngeal swab, presence of viral mutation(s) within the  areas targeted by this assay, and inadequate number of viral copies  (<250 copies / mL). A negative result must be combined with clinical  observations, patient history, and epidemiological information. If result is POSITIVE SARS-CoV-2 target nucleic acids are DETECTED. The SARS-CoV-2 RNA is generally detectable in upper and lower  respiratory specimens dur ing the acute phase of infection.  Positive  results are indicative of active infection with SARS-CoV-2.  Clinical  correlation with patient history and other diagnostic information is  necessary to determine patient infection status.  Positive results do  not rule out bacterial infection or co-infection with other viruses. If result is PRESUMPTIVE POSTIVE SARS-CoV-2 nucleic acids MAY BE PRESENT.   A presumptive positive result was obtained on the submitted specimen  and confirmed on repeat testing.  While 2019 novel coronavirus  (SARS-CoV-2) nucleic acids may be present in the submitted sample  additional confirmatory testing may be necessary for epidemiological  and / or clinical management purposes  to differentiate between  SARS-CoV-2 and other Sarbecovirus currently known to infect humans.  If clinically indicated additional testing with an alternate test  methodology [phone]) is  advised. The SARS-CoV-2 RNA is generally  detectable in upper and lower respiratory sp ecimens during the acute  phase of infection. The expected result is Negative. Fact Sheet for Patients:  StrictlyIdeas.no Fact Sheet for Healthcare Providers: BankingDealers.co.za This test is not yet approved or cleared by the [place] FDA and has been authorized for detection and/or diagnosis of SARS-CoV-2 by FDA under an Emergency Use Authorization (EUA).  This EUA will remain in effect (meaning this test can be used) for the duration of the COVID-19 declaration under Section 564(b)(1) of the Act, 21 U.S.C. section 360bbb-3(b)(1), unless the authorization is terminated or revoked sooner. Performed at [facility], [address] [place], [place] [address]   Glucose, capillary     Status: Abnormal   Collection Time: 07/19/18  5:24 PM  Result Value Ref Range   Glucose-Capillary 210 (H) 70 - 99 mg/dL  Urinalysis, Complete w Microscopic     Status: Abnormal   Collection Time: 07/19/18  5:26 PM  Result Value Ref Range   Color, Urine AMBER (A) YELLOW    Comment: BIOCHEMICALS MAY BE AFFECTED BY COLOR   APPearance HAZY (A) CLEAR   Specific Gravity, Urine 1.035 (H) 1.005 - 1.030   pH 5.0 5.0 - 8.0   Glucose, UA 150 (A) NEGATIVE mg/dL   Hgb urine dipstick NEGATIVE NEGATIVE   Bilirubin Urine NEGATIVE NEGATIVE   Ketones, ur NEGATIVE NEGATIVE mg/dL   Protein, ur 100 (A) NEGATIVE mg/dL   Nitrite NEGATIVE NEGATIVE   Leukocytes,Ua NEGATIVE NEGATIVE   RBC / HPF 0-5 0 - 5 RBC/hpf   WBC, UA 0-5 0 - 5 WBC/hpf   Bacteria, UA RARE (A) NONE SEEN   Squamous Epithelial / LPF 0-5 0 - 5   Mucus PRESENT    Hyaline Casts, UA PRESENT  Comment: Performed at [facility], [address] [place], [place] [address]  Gastrointestinal Panel by PCR , Stool     Status: Abnormal   Collection Time: 07/19/18  5:26 PM  Result Value Ref Range    Campylobacter species NOT DETECTED NOT DETECTED   Plesimonas shigelloides NOT DETECTED NOT DETECTED   Salmonella species DETECTED (A) NOT DETECTED    Comment: RESULT CALLED TO, READ BACK BY AND VERIFIED WITH: KIM Omari Mcmanaway @1944  ON 07/19/2018 BY FMW    Yersinia enterocolitica NOT DETECTED NOT DETECTED   Vibrio species NOT DETECTED NOT DETECTED   Vibrio cholerae NOT DETECTED NOT DETECTED   Enteroaggregative E coli (EAEC) NOT DETECTED NOT DETECTED   Enteropathogenic E coli (EPEC) NOT DETECTED NOT DETECTED   Enterotoxigenic E coli (ETEC) NOT DETECTED NOT DETECTED   Shiga like toxin producing E coli (STEC) NOT DETECTED NOT DETECTED   Shigella/Enteroinvasive E coli (EIEC) NOT DETECTED NOT DETECTED   Cryptosporidium NOT DETECTED NOT DETECTED   Cyclospora cayetanensis NOT DETECTED NOT DETECTED   Entamoeba histolytica NOT DETECTED NOT DETECTED   Giardia lamblia NOT DETECTED NOT DETECTED   Adenovirus F40/41 NOT DETECTED NOT DETECTED   Astrovirus NOT DETECTED NOT DETECTED   Norovirus GI/GII NOT DETECTED NOT DETECTED   Rotavirus A NOT DETECTED NOT DETECTED   Sapovirus (I, II, IV, and V) NOT DETECTED NOT DETECTED    Comment: Performed at [facility], [address] [place], [place] [address]  C Difficile Quick Screen w PCR reflex     Status: None   Collection Time: 07/19/18  5:26 PM  Result Value Ref Range   C Diff antigen NEGATIVE NEGATIVE   C Diff toxin NEGATIVE NEGATIVE   C Diff interpretation No C. difficile detected.     Comment: Performed at [facility], [address] [place], [place] [address]   No results found.  Pending Labs Unresulted Labs (From admission, onward)    Start     Ordered   07/20/18 0500  CBC  Tomorrow morning,   STAT     07/19/18 2014   07/20/18 0500  Comprehensive metabolic panel  Tomorrow morning,   STAT     07/19/18 2014   07/19/18 2012  Hemoglobin A1c  Add-on,   AD    Comments:  To assess prior glycemic control    07/19/18 2014    07/19/18 1133  Lactic acid, plasma  Once,   R     07/19/18 1133          Vitals/Pain Today's Vitals   07/19/18 1559 07/19/18 1600 07/19/18 1630 07/19/18 1700  BP: 118/73 118/73 117/79 112/80  Pulse: (!) 105 99 (!) 102 (!) 106  Resp: 20 20 17  (!) 21  Temp:      TempSrc:      SpO2: 96% 96% 95% 95%  Weight:      Height:      PainSc:        Isolation Precautions No active isolations  Medications Medications  sodium chloride flush (NS) 0.9 % injection 3 mL (has no administration in time range)  0.9 % NaCl with KCl 20 mEq/ L  infusion (has no administration in time range)  azithromycin (ZITHROMAX) tablet 1,000 mg (has no administration in time range)  azithromycin (ZITHROMAX) tablet 500 mg (has no administration in time range)  insulin aspart (novoLOG) injection 0-9 Units (has no administration in time range)  insulin aspart (novoLOG) injection 0-5 Units (has no administration in time range)  acetaminophen (TYLENOL) tablet 650 mg (has no administration in time range)    Or  acetaminophen (TYLENOL) suppository 650 mg (has no administration in time range)  ondansetron (ZOFRAN) tablet 4 mg (has no administration in time range)    Or  ondansetron (ZOFRAN) injection 4 mg (has no administration in time range)  sodium chloride 0.9 % bolus 500 mL (0 mLs Intravenous Stopped 07/19/18 1336)  diphenoxylate-atropine (LOMOTIL) 2.5-0.025 MG per tablet 1 tablet (1 tablet Oral Given 07/19/18 1239)  diltiazem (CARDIZEM) injection 15 mg (15 mg Intravenous Given 07/19/18 1512)  sodium chloride 0.9 % bolus 500 mL (500 mLs Intravenous New Bag/Given 07/19/18 1821)    Mobility walks Low fall risk   Focused Assessments    R Recommendations: See Admitting Provider Note  Report given to:   Additional Notes:

## 2018-07-19 NOTE — ED Notes (Signed)
Spoke with pt's family to give update. Explained delay in calling due to waiting for COVID 19 results.

## 2018-07-20 LAB — COMPREHENSIVE METABOLIC PANEL
ALT: 19 U/L (ref 0–44)
AST: 25 U/L (ref 15–41)
Albumin: 3.4 g/dL — ABNORMAL LOW (ref 3.5–5.0)
Alkaline Phosphatase: 71 U/L (ref 38–126)
Anion gap: 12 (ref 5–15)
BUN: 31 mg/dL — ABNORMAL HIGH (ref 8–23)
CO2: 21 mmol/L — ABNORMAL LOW (ref 22–32)
Calcium: 8.5 mg/dL — ABNORMAL LOW (ref 8.9–10.3)
Chloride: 103 mmol/L (ref 98–111)
Creatinine, Ser: 1.29 mg/dL — ABNORMAL HIGH (ref 0.61–1.24)
GFR calc Af Amer: >60 mL/min (ref >60)
GFR calc non Af Amer: 54 mL/min — ABNORMAL LOW (ref >60)
Glucose, Bld: 199 mg/dL — ABNORMAL HIGH (ref 70–99)
Potassium: 3.5 mmol/L (ref 3.5–5.1)
Sodium: 136 mmol/L (ref 135–145)
Total Bilirubin: 0.6 mg/dL (ref 0.3–1.2)
Total Protein: 6.8 g/dL (ref 6.5–8.1)

## 2018-07-20 LAB — CBC
HCT: 38.1 % — ABNORMAL LOW (ref 39.0–52.0)
Hemoglobin: 12.7 g/dL — ABNORMAL LOW (ref 13.0–17.0)
MCH: 30.1 pg (ref 26.0–34.0)
MCHC: 33.3 g/dL (ref 30.0–36.0)
MCV: 90.3 fL (ref 80.0–100.0)
Platelets: 162 10*3/uL (ref 150–400)
RBC: 4.22 MIL/uL (ref 4.22–5.81)
RDW: 14.6 % (ref 11.5–15.5)
WBC: 4.9 10*3/uL (ref 4.0–10.5)
nRBC: 0 % (ref 0.0–0.2)

## 2018-07-20 LAB — GLUCOSE, CAPILLARY
Glucose-Capillary: 183 mg/dL — ABNORMAL HIGH (ref 70–99)
Glucose-Capillary: 177 mg/dL — ABNORMAL HIGH (ref 70–99)
Glucose-Capillary: 177 mg/dL — ABNORMAL HIGH (ref 70–99)
Glucose-Capillary: 181 mg/dL — ABNORMAL HIGH (ref 70–99)

## 2018-07-20 LAB — MAGNESIUM: Magnesium: 1.6 mg/dL — ABNORMAL LOW (ref 1.7–2.4)

## 2018-07-20 NOTE — Progress Notes (Signed)
[facility] [place] NAME: Sean Reyes    MR#:  [id number]  DATE OF BIRTH:  [birth year]  SUBJECTIVE:  CHIEF COMPLAINT:   Chief Complaint  Patient presents with  . Nausea  . Emesis  . Diarrhea   Still has watery diarrhea 5 episodes overnight and more today. No abd pain Feels weak  REVIEW OF SYSTEMS:    Review of Systems  Constitutional: Positive for malaise/fatigue. Negative for chills and fever.  HENT: Negative for sore throat.   Eyes: Negative for blurred vision, double vision and pain.  Respiratory: Negative for cough, hemoptysis, shortness of breath and wheezing.   Cardiovascular: Negative for chest pain, palpitations, orthopnea and leg swelling.  Gastrointestinal: Positive for diarrhea and nausea. Negative for abdominal pain, constipation, heartburn and vomiting.  Genitourinary: Negative for dysuria and hematuria.  Musculoskeletal: Negative for back pain and joint pain.  Skin: Negative for rash.  Neurological: Positive for dizziness. Negative for sensory change, speech change, focal weakness and headaches.  Endo/Heme/Allergies: Does not bruise/bleed easily.  Psychiatric/Behavioral: Negative for depression. The patient is not nervous/anxious.     DRUG ALLERGIES:   Allergies  Allergen Reactions  . Levaquin [Levofloxacin In D5w] Anaphylaxis and Shortness Of Breath  . Shellfish Allergy Anaphylaxis    Has used duraprep, betadine and ioban in previous surgeries in 2019 and 2018 without issue  . Amiodarone Other (See Comments)    Tremors and thyroid toxicity  . Adhesive [Tape] Other (See Comments)    Little red bumps under the dressing.  He questions whether is latex related    VITALS:  Blood pressure 101/68, pulse (!) 102, temperature 97.9 F (36.6 C), temperature source Oral, resp. rate 16, height 6\' 1"  (1.854 m), weight 81.4 kg, SpO2 97 %.  PHYSICAL EXAMINATION:   Physical Exam  GENERAL:  78 y.o.-year-old patient lying  in the bed with no acute distress.  EYES: Pupils equal, round, reactive to light and accommodation. No scleral icterus. Extraocular muscles intact.  HEENT: Head atraumatic, normocephalic. Oropharynx and nasopharynx clear.  NECK:  Supple, no jugular venous distention. No thyroid enlargement, no tenderness.  LUNGS: Normal breath sounds bilaterally, no wheezing, rales, rhonchi. No use of accessory muscles of respiration.  CARDIOVASCULAR: S1, S2 normal. No murmurs, rubs, or gallops.  ABDOMEN: Soft, nontender, nondistended. Bowel sounds present. No organomegaly or mass.  EXTREMITIES: No cyanosis, clubbing or edema b/l.    NEUROLOGIC: Cranial nerves II through XII are intact. No focal Motor or sensory deficits b/l.   PSYCHIATRIC: The patient is alert and oriented x 3.  SKIN: No obvious rash, lesion, or ulcer.   LABORATORY PANEL:   CBC Recent Labs  Lab 07/20/18 0445  WBC 4.9  HGB 12.7*  HCT 38.1*  PLT 162   ------------------------------------------------------------------------------------------------------------------ Chemistries  Recent Labs  Lab 07/20/18 0445  NA 136  K 3.5  CL 103  CO2 21*  GLUCOSE 199*  BUN 31*  CREATININE 1.29*  CALCIUM 8.5*  AST 25  ALT 19  ALKPHOS 71  BILITOT 0.6   ------------------------------------------------------------------------------------------------------------------  Cardiac Enzymes No results for input(s): TROPONINI in the last 168 hours. ------------------------------------------------------------------------------------------------------------------  RADIOLOGY:  No results found.   ASSESSMENT AND PLAN:   * Salmonella diarrhea.  Due to severe diarrhea and hospitalization started on azithromycin.  No abdominal pain or fever But with multiple watery stools high risk for dehydration/decompensation.  Continue inpatient care.  * Dehydration.  Secondary to diarrhea.  On IV fluids.  * Hypertension.  Continue metoprolol.  I ordered  benazepril to start from tomorrow morning.  Amlodipine held.  * Atrial fibrillation.  Continue his A. fib medications along with Eliquis.  * Diabetes mellitus.  Sliding scale insulin ordered.  Diabetic diet  All the records are reviewed and case discussed with Care Management/Social Worker. Management plans discussed with the patient, family and they are in agreement.  CODE STATUS: FULL CODE  DVT Prophylaxis: SCDs  TOTAL TIME TAKING CARE OF THIS PATIENT: 35 minutes.   POSSIBLE D/C IN 2-3 DAYS, DEPENDING ON CLINICAL CONDITION.  Sean Reyes M.D on 07/20/2018 at 12:24 PM  Between 7am to 6pm - Pager - [phone]  After 6pm go to [email or web address] - password [email or web address] [facility]  Office  [phone]  CC: Primary care physician; Sean Maple, MD  Note: This dictation was prepared with Dragon dictation along with smaller phrase technology. Any transcriptional errors that result from this process are unintentional.

## 2018-07-21 LAB — GLUCOSE, CAPILLARY
Glucose-Capillary: 167 mg/dL — ABNORMAL HIGH (ref 70–99)
Glucose-Capillary: 166 mg/dL — ABNORMAL HIGH (ref 70–99)
Glucose-Capillary: 201 mg/dL — ABNORMAL HIGH (ref 70–99)
Glucose-Capillary: 170 mg/dL — ABNORMAL HIGH (ref 70–99)

## 2018-07-21 LAB — BASIC METABOLIC PANEL
Anion gap: 10 (ref 5–15)
BUN: 44 mg/dL — ABNORMAL HIGH (ref 8–23)
CO2: 23 mmol/L (ref 22–32)
Calcium: 8.7 mg/dL — ABNORMAL LOW (ref 8.9–10.3)
Chloride: 104 mmol/L (ref 98–111)
Creatinine, Ser: 1.42 mg/dL — ABNORMAL HIGH (ref 0.61–1.24)
GFR calc Af Amer: 56 mL/min — ABNORMAL LOW (ref >60)
GFR calc non Af Amer: 48 mL/min — ABNORMAL LOW (ref >60)
Glucose, Bld: 176 mg/dL — ABNORMAL HIGH (ref 70–99)
Potassium: 3.5 mmol/L (ref 3.5–5.1)
Sodium: 137 mmol/L (ref 135–145)

## 2018-07-21 MED ORDER — ENSURE MAX PROTEIN PO LIQD
11.0000 [oz_av] | Freq: Two times a day (BID) | ORAL | Status: DC
  Administered 2018-07-21 – 2018-07-22 (×2): 11 [oz_av] via ORAL
  Filled 2018-07-21: qty 330

## 2018-07-21 MED ORDER — LACTATED RINGERS IV SOLN
INTRAVENOUS | Status: AC
  Administered 2018-07-21 – 2018-07-22 (×3): via INTRAVENOUS

## 2018-07-21 NOTE — Progress Notes (Signed)
Initial Nutrition Assessment  RD working remotely.  DOCUMENTATION CODES:   Not applicable  INTERVENTION:  Provide Ensure Max Protein po BID, each supplement provides 150 kcal and 30 grams of protein.  NUTRITION DIAGNOSIS:   Inadequate oral intake related to decreased appetite, acute illness(Salmonella food poisoning) as evidenced by meal completion < 50%.  GOAL:   Patient will meet greater than or equal to 90% of their needs  MONITOR:   PO intake, Supplement acceptance, Labs, Weight trends, I & O's  REASON FOR ASSESSMENT:   Malnutrition Screening Tool    ASSESSMENT:   78 year old male with PMHx of anxiety, depression, A-fib s/p ablation, HTN, HLD, arthritis, sleep apnea, GERD, DM admitted with Salmonella diarrhea, AKI, dehydration.   Attempted to call patient's room phone for nutrition/weight history but he was unable to answer. Noted meal completion is 30-50%. Suspect decreased appetite/intake is related to Salmonella food poisoning. Per chart patient's UBW is around 85-86 kg. He is currently 80.2 kg (176.81 lbs).  Medications reviewed and include: azithromycin, Novolog 0-9 units TID, Novolog 0-5 units QHS, pantoprazole, LR at 75 mL/hr.  Labs reviewed: CBG 166-181, BUN 44, Creatinine 1.42.  NUTRITION - FOCUSED PHYSICAL EXAM:  Unable to complete at this time.  Diet Order:   Diet Order            Diet Carb Modified Fluid consistency: Thin; Room service appropriate? Yes  Diet effective now             EDUCATION NEEDS:   No education needs have been identified at this time  Skin:  Skin Assessment: Reviewed RN Assessment  Last BM:  07/20/2018 - small type 7  Height:   Ht Readings from Last 1 Encounters:  07/19/18 6\' 1"  (1.854 m)   Weight:   Wt Readings from Last 1 Encounters:  07/21/18 80.2 kg   Ideal Body Weight:  83.6 kg  BMI:  Body mass index is 23.33 kg/m.  Estimated Nutritional Needs:   Kcal:  1900-2100  Protein:  90-100 grams  Fluid:   1.9-2.1 L/day  Willey Blade, MS, RD, LDN Office: [phone] Pager: [phone] After Hours/Weekend Pager: [phone]

## 2018-07-21 NOTE — Progress Notes (Signed)
[place] at [place] NAME: Sean Reyes    MR#:  [id number]  DATE OF BIRTH:  Jan 28, 1941  SUBJECTIVE:  CHIEF COMPLAINT:   Chief Complaint  Patient presents with  . Nausea  . Emesis  . Diarrhea   Still has watery diarrhea but improving Some lower abdominal cramping today Afebrile  REVIEW OF SYSTEMS:    Review of Systems  Constitutional: Positive for malaise/fatigue. Negative for chills and fever.  HENT: Negative for sore throat.   Eyes: Negative for blurred vision, double vision and pain.  Respiratory: Negative for cough, hemoptysis, shortness of breath and wheezing.   Cardiovascular: Negative for chest pain, palpitations, orthopnea and leg swelling.  Gastrointestinal: Positive for diarrhea and nausea. Negative for abdominal pain, constipation, heartburn and vomiting.  Genitourinary: Negative for dysuria and hematuria.  Musculoskeletal: Negative for back pain and joint pain.  Skin: Negative for rash.  Neurological: Positive for dizziness. Negative for sensory change, speech change, focal weakness and headaches.  Endo/Heme/Allergies: Does not bruise/bleed easily.  Psychiatric/Behavioral: Negative for depression. The patient is not nervous/anxious.     DRUG ALLERGIES:   Allergies  Allergen Reactions  . Levaquin [Levofloxacin In D5w] Anaphylaxis and Shortness Of Breath  . Shellfish Allergy Anaphylaxis    Has used duraprep, betadine and ioban in previous surgeries in 2019 and 2018 without issue  . Amiodarone Other (See Comments)    Tremors and thyroid toxicity  . Adhesive [Tape] Other (See Comments)    Little red bumps under the dressing.  He questions whether is latex related    VITALS:  Blood pressure 99/70, pulse 88, temperature 97.8 F (36.6 C), resp. rate 18, height 6\' 1"  (1.854 m), weight 80.2 kg, SpO2 96 %.  PHYSICAL EXAMINATION:   Physical Exam  GENERAL:  78 y.o.-year-old patient lying in the bed with no acute  distress.  EYES: Pupils equal, round, reactive to light and accommodation. No scleral icterus. Extraocular muscles intact.  HEENT: Head atraumatic, normocephalic. Oropharynx and nasopharynx clear.  NECK:  Supple, no jugular venous distention. No thyroid enlargement, no tenderness.  LUNGS: Normal breath sounds bilaterally, no wheezing, rales, rhonchi. No use of accessory muscles of respiration.  CARDIOVASCULAR: S1, S2 normal. No murmurs, rubs, or gallops.  ABDOMEN: Soft, nontender, nondistended. Bowel sounds present. No organomegaly or mass.  EXTREMITIES: No cyanosis, clubbing or edema b/l.    NEUROLOGIC: Cranial nerves II through XII are intact. No focal Motor or sensory deficits b/l.   PSYCHIATRIC: The patient is alert and oriented x 3.  SKIN: No obvious rash, lesion, or ulcer.   LABORATORY PANEL:   CBC Recent Labs  Lab 07/20/18 0445  WBC 4.9  HGB 12.7*  HCT 38.1*  PLT 162   ------------------------------------------------------------------------------------------------------------------ Chemistries  Recent Labs  Lab 07/20/18 0445 07/21/18 0644  NA 136 137  K 3.5 3.5  CL 103 104  CO2 21* 23  GLUCOSE 199* 176*  BUN 31* 44*  CREATININE 1.29* 1.42*  CALCIUM 8.5* 8.7*  MG 1.6*  --   AST 25  --   ALT 19  --   ALKPHOS 71  --   BILITOT 0.6  --    ------------------------------------------------------------------------------------------------------------------  Cardiac Enzymes No results for input(s): TROPONINI in the last 168 hours. ------------------------------------------------------------------------------------------------------------------  RADIOLOGY:  No results found.   ASSESSMENT AND PLAN:   * Salmonella diarrhea.  Due to severe diarrhea and hospitalization started on azithromycin.  No abdominal pain or fever Continues to have watery stools with some  improvement today.  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  Start IV fluids.  *  Dehydration.  Secondary to diarrhea.    Restart IV fluids.  * Hypertension.  Continue metoprolol.  Discontinue benazepril  * Atrial fibrillation.  Continue his A. fib medications along with Eliquis.  * Diabetes mellitus.  Sliding scale insulin ordered.  Diabetic diet  All the records are reviewed and case discussed with Care Management/Social Worker. Management plans discussed with the patient, family and they are in agreement.  CODE STATUS: FULL CODE  DVT Prophylaxis: SCDs  TOTAL TIME TAKING CARE OF THIS PATIENT: 35 minutes.   POSSIBLE D/C IN 2-3 DAYS, DEPENDING ON CLINICAL CONDITION.  [name] Mancil Pfenning M.D on 07/21/2018 at 11:45 AM  Between 7am to 6pm - Pager - [phone]  After 6pm go to [email or web address] - password [email or web address] [facility]  Office  [phone]  CC: Primary care physician; [name], MD  Note: This dictation was prepared with Dragon dictation along with smaller phrase technology. Any transcriptional errors that result from this process are unintentional.

## 2018-07-22 LAB — BASIC METABOLIC PANEL
Anion gap: 11 (ref 5–15)
Anion gap: 9 (ref 5–15)
BUN: 43 mg/dL — ABNORMAL HIGH (ref 8–23)
BUN: 40 mg/dL — ABNORMAL HIGH (ref 8–23)
CO2: 23 mmol/L (ref 22–32)
CO2: 22 mmol/L (ref 22–32)
Calcium: 8.5 mg/dL — ABNORMAL LOW (ref 8.9–10.3)
Calcium: 8.5 mg/dL — ABNORMAL LOW (ref 8.9–10.3)
Chloride: 101 mmol/L (ref 98–111)
Chloride: 102 mmol/L (ref 98–111)
Creatinine, Ser: 1.32 mg/dL — ABNORMAL HIGH (ref 0.61–1.24)
Creatinine, Ser: 1.17 mg/dL (ref 0.61–1.24)
GFR calc Af Amer: >60 mL/min (ref >60)
GFR calc Af Amer: >60 mL/min (ref >60)
GFR calc non Af Amer: 52 mL/min — ABNORMAL LOW (ref >60)
GFR calc non Af Amer: >60 mL/min (ref >60)
Glucose, Bld: 153 mg/dL — ABNORMAL HIGH (ref 70–99)
Glucose, Bld: 184 mg/dL — ABNORMAL HIGH (ref 70–99)
Potassium: 2.6 mmol/L — CL (ref 3.5–5.1)
Potassium: 3.3 mmol/L — ABNORMAL LOW (ref 3.5–5.1)
Sodium: 135 mmol/L (ref 135–145)
Sodium: 133 mmol/L — ABNORMAL LOW (ref 135–145)

## 2018-07-22 LAB — GLUCOSE, CAPILLARY
Glucose-Capillary: 154 mg/dL — ABNORMAL HIGH (ref 70–99)
Glucose-Capillary: 162 mg/dL — ABNORMAL HIGH (ref 70–99)
Glucose-Capillary: 154 mg/dL — ABNORMAL HIGH (ref 70–99)
Glucose-Capillary: 209 mg/dL — ABNORMAL HIGH (ref 70–99)

## 2018-07-22 LAB — MAGNESIUM: Magnesium: 1.9 mg/dL (ref 1.7–2.4)

## 2018-07-22 MED ORDER — POTASSIUM CHLORIDE CRYS ER 20 MEQ PO TBCR
40.0000 meq | EXTENDED_RELEASE_TABLET | Freq: Once | ORAL | Status: AC
  Administered 2018-07-22: 23:00:00 40 meq via ORAL
  Filled 2018-07-22: qty 2

## 2018-07-22 MED ORDER — AZITHROMYCIN 500 MG PO TABS: 500.0000 mg | ORAL_TABLET | Freq: Every day | ORAL | 0 refills | Status: AC

## 2018-07-22 MED ORDER — ENSURE MAX PROTEIN PO LIQD: 11.0000 [oz_av] | Freq: Two times a day (BID) | ORAL | 1 refills | Status: DC

## 2018-07-22 MED ORDER — POTASSIUM CHLORIDE CRYS ER 20 MEQ PO TBCR
40.0000 meq | EXTENDED_RELEASE_TABLET | Freq: Once | ORAL | Status: AC
  Administered 2018-07-22: 08:00:00 40 meq via ORAL
  Filled 2018-07-22: qty 2

## 2018-07-22 MED ORDER — POTASSIUM CHLORIDE 10 MEQ/100ML IV SOLN
10.0000 meq | INTRAVENOUS | Status: AC
  Administered 2018-07-22 (×4): 10 meq via INTRAVENOUS
  Filled 2018-07-22 (×4): qty 100

## 2018-07-22 NOTE — Progress Notes (Signed)
CRITICAL VALUE STICKER  CRITICAL VALUE: K+ 2.6  RECEIVER (on-site recipient of call): Dorna Bloom RN  DATE & TIME NOTIFIED: 07/22/18  2924  MESSENGER (representative from lab):  MD NOTIFIED: Keene Breath  TIME OF NOTIFICATION: 0632  RESPONSE: Sent secure text to NP.

## 2018-07-22 NOTE — Discharge Summary (Addendum)
[place] at [place] NAME: Sean Reyes    MR#:  [id number]  DATE OF BIRTH:  [birth year]  DATE OF ADMISSION:  07/19/2018 ADMITTING PHYSICIAN: Hillary Bow, MD  DATE OF DISCHARGE: 07/23/2018  PRIMARY CARE PHYSICIAN: [name], MD    ADMISSION DIAGNOSIS:  Dehydration [E86.0] Tachycardia [R00.0] Diarrhea, unspecified type [R19.7]  DISCHARGE DIAGNOSIS:  Active Problems:   Salmonella food poisoning   SECONDARY DIAGNOSIS:   Past Medical History:  Diagnosis Date  . Anemia   . Anxiety   . Arthritis   . Atrial fibrillation ([place])   . Cataracts, bilateral   . Complication of anesthesia    pt reports low BP's after surgery at [facility] and difficulty awakening  . Depression   . Diabetes ([place])    dx 6-8 yrs ago  . Dysrhythmia    a-fib  . GERD (gastroesophageal reflux disease)    OCC TAKES ALKA SELTZER  . History of kidney stones    10-15 yrs ago  . 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 Hyperlipidemia   . Hypertension   . Nocturia   . S/P ablation of atrial fibrillation    Ablative therapy  . Sleep apnea    CPAP   . Tachycardia, unspecified     HOSPITAL COURSE:   78 year old male with history of PAF on anticoagulation, diabetes and hypertension who presents to the emergency room due to watery diarrhea.  1.  Salmonella diarrhea: Patient symptoms have improved. He was started on azithromycin due to diarrhea which she will continue for 2 more days. C. difficile testing was negative.  2.  Hypokalemia: This was repleted and was due to diarrhea.  3.  PAF: Patient will continue metoprolol and Rythmol.  He will continue Eliquis for stroke prevention.  4.  Diabetes: Continue ADA diet with metformin  5.  Essential hypertension: Continue Norvasc and Benzapril along with metoprolol.  DISCHARGE CONDITIONS AND DIET:  Stable ADA diet  CONSULTS OBTAINED:    DRUG ALLERGIES:   Allergies  Allergen Reactions   . Levaquin [Levofloxacin In D5w] Anaphylaxis and Shortness Of Breath  . Shellfish Allergy Anaphylaxis    Has used duraprep, betadine and ioban in previous surgeries in 2019 and 2018 without issue  . Amiodarone Other (See Comments)    Tremors and thyroid toxicity  . Adhesive [Tape] Other (See Comments)    Little red bumps under the dressing.  He questions whether is latex related    DISCHARGE MEDICATIONS:   Allergies as of 07/23/2018      Reactions   Levaquin [levofloxacin In D5w] Anaphylaxis, Shortness Of Breath   Shellfish Allergy Anaphylaxis   Has used duraprep, betadine and ioban in previous surgeries in 2019 and 2018 without issue   Amiodarone Other (See Comments)   Tremors and thyroid toxicity   Adhesive [tape] Other (See Comments)   Little red bumps under the dressing.  He questions whether is latex related      Medication List    TAKE these medications   amLODipine 5 MG tablet Commonly known as:  NORVASC Take 1 tablet (5 mg total) by mouth daily.   azithromycin 500 MG tablet Commonly known as:  ZITHROMAX Take 1 tablet (500 mg total) by mouth daily for 2 days.   benazepril 40 MG tablet Commonly known as:  LOTENSIN Take 1 tablet (40 mg total) by mouth daily.   cetirizine 10 MG tablet Commonly known as:  ZYRTEC  Take 10 mg by mouth daily.   Eliquis 5 MG Tabs tablet Generic drug:  apixaban Take 5 mg by mouth 2 (two) times daily.   Ensure Max Protein Liqd Take 330 mLs (11 oz total) by mouth 2 (two) times daily between meals.   finasteride 5 MG tablet Commonly known as:  PROSCAR Take 1 tablet (5 mg total) by mouth daily.   metFORMIN 500 MG 24 hr tablet Commonly known as:  GLUCOPHAGE-XR Take 2 tablets (1,000 mg total) by mouth 2 (two) times daily.   metoprolol succinate 25 MG 24 hr tablet Commonly known as:  TOPROL-XL Take 1 tablet (25 mg total) by mouth 2 (two) times daily.   pantoprazole 40 MG tablet Commonly known as:  PROTONIX Take 40 mg by mouth daily  before breakfast.   propafenone 425 MG 12 hr capsule Commonly known as:  RYTHMOL SR Take 425 mg by mouth 2 (two) times daily.         Today   CHIEF COMPLAINT:  Doing better would rest better at home diarrhea improving.   VITAL SIGNS:  Blood pressure 105/71, pulse (!) 111, temperature 98.3 F (36.8 C), temperature source Oral, resp. rate 18, height 6\' 1"  (1.854 m), weight 79.9 kg, SpO2 98 %.   REVIEW OF SYSTEMS:  Review of Systems  Constitutional: Negative.  Negative for chills, fever and malaise/fatigue.  HENT: Negative.  Negative for ear discharge, ear pain, hearing loss, nosebleeds and sore throat.   Eyes: Negative.  Negative for blurred vision and pain.  Respiratory: Negative.  Negative for cough, hemoptysis, shortness of breath and wheezing.   Cardiovascular: Negative.  Negative for chest pain, palpitations and leg swelling.  Gastrointestinal: Positive for diarrhea (better). Negative for abdominal pain, blood in stool, nausea and vomiting.  Genitourinary: Negative.  Negative for dysuria.  Musculoskeletal: Negative.  Negative for back pain.  Skin: Negative.   Neurological: Negative for dizziness, tremors, speech change, focal weakness, seizures and headaches.  Endo/Heme/Allergies: Negative.  Does not bruise/bleed easily.  Psychiatric/Behavioral: Negative.  Negative for depression, hallucinations and suicidal ideas.     PHYSICAL EXAMINATION:  GENERAL:  78 y.o.-year-old patient lying in the bed with no acute distress.  NECK:  Supple, no jugular venous distention. No thyroid enlargement, no tenderness.  LUNGS: Normal breath sounds bilaterally, no wheezing, rales,rhonchi  No use of accessory muscles of respiration.  CARDIOVASCULAR: S1, S2 normal. No murmurs, rubs, or gallops.  ABDOMEN: Soft, non-tender, non-distended. Bowel sounds present. No organomegaly or mass.  EXTREMITIES: No pedal edema, cyanosis, or clubbing.  PSYCHIATRIC: The patient is alert and oriented x 3.   SKIN: No obvious rash, lesion, or ulcer.   DATA REVIEW:   CBC Recent Labs  Lab 07/20/18 0445  WBC 4.9  HGB 12.7*  HCT 38.1*  PLT 162    Chemistries  Recent Labs  Lab 07/20/18 0445  07/23/18 0504  NA 136   < > 136  K 3.5   < > 3.2*  CL 103   < > 104  CO2 21*   < > 25  GLUCOSE 199*   < > 176*  BUN 31*   < > 35*  CREATININE 1.29*   < > 1.24  CALCIUM 8.5*   < > 8.7*  MG 1.6*   < > 2.1  AST 25  --   --   ALT 19  --   --   ALKPHOS 71  --   --   BILITOT 0.6  --   --    < > =  values in this interval not displayed.    Cardiac Enzymes No results for input(s): TROPONINI in the last 168 hours.  Microbiology Results  @MICRORSLT48 @  RADIOLOGY:  No results found.    Allergies as of 07/23/2018      Reactions   Levaquin [levofloxacin In D5w] Anaphylaxis, Shortness Of Breath   Shellfish Allergy Anaphylaxis   Has used duraprep, betadine and ioban in previous surgeries in 2019 and 2018 without issue   Amiodarone Other (See Comments)   Tremors and thyroid toxicity   Adhesive [tape] Other (See Comments)   Little red bumps under the dressing.  He questions whether is latex related      Medication List    TAKE these medications   amLODipine 5 MG tablet Commonly known as:  NORVASC Take 1 tablet (5 mg total) by mouth daily.   azithromycin 500 MG tablet Commonly known as:  ZITHROMAX Take 1 tablet (500 mg total) by mouth daily for 2 days.   benazepril 40 MG tablet Commonly known as:  LOTENSIN Take 1 tablet (40 mg total) by mouth daily.   cetirizine 10 MG tablet Commonly known as:  ZYRTEC Take 10 mg by mouth daily.   Eliquis 5 MG Tabs tablet Generic drug:  apixaban Take 5 mg by mouth 2 (two) times daily.   Ensure Max Protein Liqd Take 330 mLs (11 oz total) by mouth 2 (two) times daily between meals.   finasteride 5 MG tablet Commonly known as:  PROSCAR Take 1 tablet (5 mg total) by mouth daily.   metFORMIN 500 MG 24 hr tablet Commonly known as:   GLUCOPHAGE-XR Take 2 tablets (1,000 mg total) by mouth 2 (two) times daily.   metoprolol succinate 25 MG 24 hr tablet Commonly known as:  TOPROL-XL Take 1 tablet (25 mg total) by mouth 2 (two) times daily.   pantoprazole 40 MG tablet Commonly known as:  PROTONIX Take 40 mg by mouth daily before breakfast.   propafenone 425 MG 12 hr capsule Commonly known as:  RYTHMOL SR Take 425 mg by mouth 2 (two) times daily.          Management plans discussed with the patient and he is in agreement. Stable for discharge home  Patient should follow up with pcp  CODE STATUS:     Code Status Orders  (From admission, onward)         Start     Ordered   07/19/18 2014  Full code  Continuous     07/19/18 2014        Code Status History    Date Active Date Inactive Code Status Order ID Comments User Context   04/29/2018 1258 04/30/2018 1940 Full Code [id number]  Kary Kos, MD Inpatient   06/27/2017 1807 07/02/2017 1333 Full Code [id number]  Kary Kos, MD Inpatient   09/13/2016 1214 09/14/2016 1723 Full Code [id number]  Kary Kos, MD Inpatient    Advance Directive Documentation     Most Recent Value  Type of Advance Directive  Living will, Healthcare Power of Attorney  Pre-existing out of facility DNR order (yellow form or pink MOST form)  -  "MOST" Form in Place?  -      TOTAL TIME TAKING CARE OF THIS PATIENT: 38 minutes.    Note: This dictation was prepared with Dragon dictation along with smaller phrase technology. Any transcriptional errors that result from this process are unintentional.  Bettey Costa M.D on 07/23/2018 at 11:01 AM  Between 7am to 6pm -  Pager - [phone] After 6pm go to [email or web address] - password [email or web address] [facility]  Office  [phone]  CC: Primary care physician; [name], MD

## 2018-07-22 NOTE — Progress Notes (Signed)
Dr Benjie Karvonen said she would contact patients daughter and family

## 2018-07-22 NOTE — Consult Note (Signed)
PHARMACY CONSULT NOTE - FOLLOW UP  Pharmacy Consult for Electrolyte Monitoring and Replacement   Recent Labs: Potassium (mmol/L)  Date Value  07/22/2018 3.3 (L)  07/01/2012 3.9   Magnesium (mg/dL)  Date Value  07/22/2018 1.9   Calcium (mg/dL)  Date Value  07/22/2018 8.5 (L)   Calcium, Total (mg/dL)  Date Value  07/01/2012 9.2   Albumin (g/dL)  Date Value  07/20/2018 3.4 (L)  12/20/2017 4.1   Sodium (mmol/L)  Date Value  07/22/2018 133 (L)  12/20/2017 136  07/01/2012 138     Assessment: Pharmacy has been consulted to replenish electrolytes in this 78 yo male with Salmonella diarrhea.  Goal of Therapy:  K~4, Mg ~ 2.0  Plan:  6/8 PM: K: 3.3. Will give pt 65meq po KCl x 1 dose.   Will recheck electrolytes with AM labs and continue to replace electrolytes as needed.  Pernell Dupre, PharmD, BCPS Clinical Pharmacist 07/22/2018 10:31 PM

## 2018-07-22 NOTE — Progress Notes (Signed)
Per Dr Lisbeth Renshaw, we do not need to restart continuous fluids

## 2018-07-22 NOTE — Consult Note (Addendum)
PHARMACY CONSULT NOTE - FOLLOW UP  Pharmacy Consult for Electrolyte Monitoring and Replacement   Recent Labs: Potassium (mmol/L)  Date Value  07/22/2018 2.6 (LL)  07/01/2012 3.9   Magnesium (mg/dL)  Date Value  07/20/2018 1.6 (L)   Calcium (mg/dL)  Date Value  07/22/2018 8.5 (L)   Calcium, Total (mg/dL)  Date Value  07/01/2012 9.2   Albumin (g/dL)  Date Value  07/20/2018 3.4 (L)  12/20/2017 4.1   Sodium (mmol/L)  Date Value  07/22/2018 135  12/20/2017 136  07/01/2012 138     Assessment: Pharmacy has been consulted to replenish electrolytes in this 78 yo male with Salmonella diarrhea.  Goal of Therapy:  K~4, Mg ~ 2.0  Plan:  Will give pt 76meq po KCl and 11meq KCl IV x 4.  Will check add-on magnesium (returned 1.9 - no replenishment needed)  Will recheck K 1 hour after last infusion of KCl IV.  Sean Reyes, PharmD, BCPS Clinical Pharmacist 07/22/2018 12:16 PM

## 2018-07-22 NOTE — Care Management Important Message (Signed)
Important Message  Patient Details  Name: Sean Reyes. MRN: [id number] Date of Birth: 06-29-1940   Medicare Important Message Given:  Yes    [name] 07/22/2018, 11:50 AM

## 2018-07-23 LAB — BASIC METABOLIC PANEL
Anion gap: 7 (ref 5–15)
BUN: 35 mg/dL — ABNORMAL HIGH (ref 8–23)
CO2: 25 mmol/L (ref 22–32)
Calcium: 8.7 mg/dL — ABNORMAL LOW (ref 8.9–10.3)
Chloride: 104 mmol/L (ref 98–111)
Creatinine, Ser: 1.24 mg/dL (ref 0.61–1.24)
GFR calc Af Amer: >60 mL/min (ref >60)
GFR calc non Af Amer: 57 mL/min — ABNORMAL LOW (ref >60)
Glucose, Bld: 176 mg/dL — ABNORMAL HIGH (ref 70–99)
Potassium: 3.2 mmol/L — ABNORMAL LOW (ref 3.5–5.1)
Sodium: 136 mmol/L (ref 135–145)

## 2018-07-23 LAB — GLUCOSE, CAPILLARY: Glucose-Capillary: 154 mg/dL — ABNORMAL HIGH (ref 70–99)

## 2018-07-23 LAB — MAGNESIUM: Magnesium: 2.1 mg/dL (ref 1.7–2.4)

## 2018-07-23 MED ORDER — POTASSIUM CHLORIDE CRYS ER 20 MEQ PO TBCR
40.0000 meq | EXTENDED_RELEASE_TABLET | Freq: Once | ORAL | Status: AC
  Administered 2018-07-23: 10:00:00 40 meq via ORAL
  Filled 2018-07-23: qty 2

## 2018-07-23 NOTE — Consult Note (Signed)
PHARMACY CONSULT NOTE - FOLLOW UP  Pharmacy Consult for Electrolyte Monitoring and Replacement   Recent Labs: Potassium (mmol/L)  Date Value  07/23/2018 3.2 (L)  07/01/2012 3.9   Magnesium (mg/dL)  Date Value  07/23/2018 2.1   Calcium (mg/dL)  Date Value  07/23/2018 8.7 (L)   Calcium, Total (mg/dL)  Date Value  07/01/2012 9.2   Albumin (g/dL)  Date Value  07/20/2018 3.4 (L)  12/20/2017 4.1   Sodium (mmol/L)  Date Value  07/23/2018 136  12/20/2017 136  07/01/2012 138     Assessment: Pharmacy has been consulted to replenish electrolytes in this 78 yo male with Salmonella diarrhea.  Goal of Therapy:  K~4, Mg ~ 2.0  Plan:  6/9 AM: K: 3.2. Will give pt 40meq po KCl x 1 dose.   Will recheck K @ 1800 and continue to replace electrolytes as needed.  Lu Duffel, PharmD, BCPS Clinical Pharmacist 07/23/2018 7:20 AM

## 2018-07-23 NOTE — Progress Notes (Signed)
Pt d/ced home.  Removed IV.  Encouraged pt to continue to hydrate with fluids like gatorade to replace K+ if he continues to have diarrhea.  Also replaced K+ by mouth today. Reviewed new medications and f/u appts.  Pt will be picked up by family.

## 2018-07-23 NOTE — Progress Notes (Signed)
[place] at [place] NAME: Sean Reyes    MR#:  [id number]  DATE OF BIRTH:  01-16-1941  SUBJECTIVE:  Diarrhea improved  REVIEW OF SYSTEMS:    Review of Systems  Constitutional: Negative for fever, chills weight loss HENT: Negative for ear pain, nosebleeds, congestion, facial swelling, rhinorrhea, neck pain, neck stiffness and ear discharge.   Respiratory: Negative for cough, shortness of breath, wheezing  Cardiovascular: Negative for chest pain, palpitations and leg swelling.  Gastrointestinal: Negative for heartburn, abdominal pain, vomiting, diarrhea (IMPROVING) or consitpation Genitourinary: Negative for dysuria, urgency, frequency, hematuria Musculoskeletal: Negative for back pain or joint pain Neurological: Negative for dizziness, seizures, syncope, focal weakness,  numbness and headaches.  Hematological: Does not bruise/bleed easily.  Psychiatric/Behavioral: Negative for hallucinations, confusion, dysphoric mood    Tolerating Diet: yes      DRUG ALLERGIES:   Allergies  Allergen Reactions  . Levaquin [Levofloxacin In D5w] Anaphylaxis and Shortness Of Breath  . Shellfish Allergy Anaphylaxis    Has used duraprep, betadine and ioban in previous surgeries in 2019 and 2018 without issue  . Amiodarone Other (See Comments)    Tremors and thyroid toxicity  . Adhesive [Tape] Other (See Comments)    Little red bumps under the dressing.  He questions whether is latex related    VITALS:  Blood pressure 105/71, pulse (!) 111, temperature 98.3 F (36.8 C), temperature source Oral, resp. rate 18, height 6\' 1"  (1.854 m), weight 79.9 kg, SpO2 98 %.  PHYSICAL EXAMINATION:  Constitutional: Appears well-developed and well-nourished. No distress. HENT: Normocephalic. [name] Oropharynx is clear and moist.  Eyes: Conjunctivae and EOM are normal. PERRLA, no scleral icterus.  Neck: Normal ROM. Neck supple. No JVD. No tracheal deviation. CVS: RRR,  S1/S2 +, no murmurs, no gallops, no carotid bruit.  Pulmonary: Effort and breath sounds normal, no stridor, rhonchi, wheezes, rales.  Abdominal: Soft. BS +,  no distension, tenderness, rebound or guarding.  Musculoskeletal: Normal range of motion. No edema and no tenderness.  Neuro: Alert. CN 2-12 grossly intact. No focal deficits. Skin: Skin is warm and dry. No rash noted. Psychiatric: Normal mood and affect.      LABORATORY PANEL:   CBC Recent Labs  Lab 07/20/18 0445  WBC 4.9  HGB 12.7*  HCT 38.1*  PLT 162   ------------------------------------------------------------------------------------------------------------------  Chemistries  Recent Labs  Lab 07/20/18 0445  07/23/18 0504  NA 136   < > 136  K 3.5   < > 3.2*  CL 103   < > 104  CO2 21*   < > 25  GLUCOSE 199*   < > 176*  BUN 31*   < > 35*  CREATININE 1.29*   < > 1.24  CALCIUM 8.5*   < > 8.7*  MG 1.6*   < > 2.1  AST 25  --   --   ALT 19  --   --   ALKPHOS 71  --   --   BILITOT 0.6  --   --    < > = values in this interval not displayed.   ------------------------------------------------------------------------------------------------------------------  Cardiac Enzymes No results for input(s): TROPONINI in the last 168 hours. ------------------------------------------------------------------------------------------------------------------  RADIOLOGY:  No results found.   ASSESSMENT AND PLAN:   78 year old male with history of PAF on anticoagulation, diabetes and hypertension who presents to the emergency room due to watery diarrhea.  1.  Salmonella diarrhea: Patient symptoms have improved. He was started on azithromycin  due to diarrhea which she will continue for 2 more days. C. difficile testing was negative.  2.  Hypokalemia: This was repleted and was due to diarrhea.  3.  PAF: Patient will continue metoprolol and Rythmol.  He will continue Eliquis for stroke prevention.  4.  Diabetes:  Continue ADA diet with metformin  5.  Essential hypertension: Continue Norvasc and Benzapril along with metoprolol.      Management plans discussed with the patient and he is in agreement.  CODE STATUS: full  TOTAL TIME TAKING CARE OF THIS PATIENT: 26 minutes.     POSSIBLE D/C today with HHC, DEPENDING ON CLINICAL CONDITION.   Bettey Costa M.D on 07/22/2018 at 11:04 AM  Between 7am to 6pm - Pager - [phone] After 6pm go to [email or web address] - password [email or web address] [facility]  Office  [phone]  CC: Primary care physician; [name], MD  Note: This dictation was prepared with Dragon dictation along with smaller phrase technology. Any transcriptional errors that result from this process are unintentional.

## 2018-07-24 ENCOUNTER — Encounter: Payer: Self-pay | Admitting: Family Medicine

## 2018-07-24 ENCOUNTER — Telehealth: Payer: Self-pay

## 2018-07-24 ENCOUNTER — Other Ambulatory Visit: Payer: Self-pay

## 2018-07-24 ENCOUNTER — Ambulatory Visit (INDEPENDENT_AMBULATORY_CARE_PROVIDER_SITE_OTHER): Payer: Medicare Other | Admitting: Family Medicine

## 2018-07-24 DIAGNOSIS — A020 Salmonella enteritis: Secondary | ICD-10-CM | POA: Insufficient documentation

## 2018-07-24 DIAGNOSIS — I10 Essential (primary) hypertension: Secondary | ICD-10-CM

## 2018-07-24 DIAGNOSIS — E1143 Type 2 diabetes mellitus with diabetic autonomic (poly)neuropathy: Secondary | ICD-10-CM

## 2018-07-24 NOTE — Telephone Encounter (Signed)
Transition Care Management Follow-up Telephone Call  Date of discharge and from where: 07/23/2018 [facility]   How have you been since you were released from the hospital? "im still feeling really bad, better than when in the hospital   Any questions or concerns? Yes , just still not feeling well. Can barley get out of bed  Items Reviewed:  Did the pt receive and understand the discharge instructions provided? Yes   Medications obtained and verified? Yes   Any new allergies since your discharge? No   Dietary orders reviewed? Yes  Do you have support at home? Yes   Functional Questionnaire: (I = Independent and D = Dependent) ADLs: d  Bathing/Dressing- D  Meal Prep- D  Eating- I  Maintaining continence- I  Transferring/Ambulation- I, very weak though   Managing Meds- I   Follow up appointments reviewed:   PCP Hospital f/u appt confirmed? Yes  Scheduled to see Dr.Crissman on 07/24/2018 @ [facility] f/u appt confirmed? No    Are transportation arrangements needed? No   If their condition worsens, is the pt aware to call PCP or go to the Emergency Dept.? Yes  Was the patient provided with contact information for the PCP's office or ED? Yes  Was to pt encouraged to call back with questions or concerns? Yes

## 2018-07-24 NOTE — Progress Notes (Signed)
There were no vitals taken for this visit.   Subjective:    Patient ID: Sean Hacker., male    DOB: September 25, 1940, 78 y.o.   MRN: [id number]  HPI: Sean Agustin. is a 78 y.o. male  Hospital follow-up Discussed with patient and Sean Reyes patient having great deal of continued diarrhea is taking azithromycin for Salmonella.  Patient is noted his blood sugar is going up with diarrhea and not eating. Patient also having limited appetite and weight loss.  No further blood in his stool.  Transition of Care Hospital Follow up.   Hospital/Facility:[facility] D/C Physician: Theo Dills D/C Date: 07-23-18  Records Requested: na Records Received: na Records Reviewed: today  Diagnoses on Discharge: Salmonella  Date of interactive Contact within 48 hours of discharge: today Contact was through: phone  Date of 7 day or 14 day face-to-face visit:    today  Outpatient Encounter Medications as of 07/24/2018  Medication Sig Note  . amLODipine (NORVASC) 5 MG tablet Take 1 tablet (5 mg total) by mouth daily.   [name] azithromycin (ZITHROMAX) 500 MG tablet Take 1 tablet (500 mg total) by mouth daily for 2 days.   . benazepril (LOTENSIN) 40 MG tablet Take 1 tablet (40 mg total) by mouth daily.   . cetirizine (ZYRTEC) 10 MG tablet Take 10 mg by mouth daily.   [name] ELIQUIS 5 MG TABS tablet Take 5 mg by mouth 2 (two) times daily.  07/19/2018: Stop for inject  . Ensure Max Protein (ENSURE MAX PROTEIN) LIQD Take 330 mLs (11 oz total) by mouth 2 (two) times daily between meals.   . finasteride (PROSCAR) 5 MG tablet Take 1 tablet (5 mg total) by mouth daily.   . metFORMIN (GLUCOPHAGE-XR) 500 MG 24 hr tablet Take 2 tablets (1,000 mg total) by mouth 2 (two) times daily.   . metoprolol succinate (TOPROL-XL) 25 MG 24 hr tablet Take 1 tablet (25 mg total) by mouth 2 (two) times daily.   . pantoprazole (PROTONIX) 40 MG tablet Take 40 mg by mouth daily before breakfast.   . propafenone (RYTHMOL SR) 425 MG 12 hr capsule Take 425 mg by  mouth 2 (two) times daily.    No facility-administered encounter medications on file as of 07/24/2018.     Diagnostic Tests Reviewed/Disposition: done  Consults:na  Discharge Instructions reviewed  Disease/illness Education:doen  Home Health/Community Services Discussions/Referrals:na  Establishment or re-establishment of referral orders for community resources:na  Discussion with other health care providers:na  Assessment and Support of treatment regimen adherence:done  Appointments Coordinated with: na  Education for self-management, independent living, and ADLs: done  Relevant past medical, surgical, family and social history reviewed and updated as indicated. Interim medical history since our last visit reviewed. Allergies and medications reviewed and updated.  Review of Systems  Constitutional: Positive for activity change, appetite change, fatigue and unexpected weight change. Negative for chills, diaphoresis and fever.  Respiratory: Negative.   Cardiovascular: Negative.     Per HPI unless specifically indicated above     Objective:    There were no vitals taken for this visit.  Wt Readings from Last 3 Encounters:  07/22/18 176 lb 2.4 oz (79.9 kg)  07/19/18 190 lb (86.2 kg)  04/29/18 189 lb 6 oz (85.9 kg)    Physical Exam  Results for orders placed or performed during the hospital encounter of 07/19/18  SARS Coronavirus 2 (CEPHEID- Performed in [place] hospital lab), [facility] Order  Result Value Ref Range   SARS Coronavirus  2 NEGATIVE NEGATIVE  Gastrointestinal Panel by PCR , Stool  Result Value Ref Range   Campylobacter species NOT DETECTED NOT DETECTED   Plesimonas shigelloides NOT DETECTED NOT DETECTED   Salmonella species DETECTED (A) NOT DETECTED   Yersinia enterocolitica NOT DETECTED NOT DETECTED   Vibrio species NOT DETECTED NOT DETECTED   Vibrio cholerae NOT DETECTED NOT DETECTED   Enteroaggregative E coli (EAEC) NOT DETECTED NOT DETECTED    Enteropathogenic E coli (EPEC) NOT DETECTED NOT DETECTED   Enterotoxigenic E coli (ETEC) NOT DETECTED NOT DETECTED   Shiga like toxin producing E coli (STEC) NOT DETECTED NOT DETECTED   Shigella/Enteroinvasive E coli (EIEC) NOT DETECTED NOT DETECTED   Cryptosporidium NOT DETECTED NOT DETECTED   Cyclospora cayetanensis NOT DETECTED NOT DETECTED   Entamoeba histolytica NOT DETECTED NOT DETECTED   Giardia lamblia NOT DETECTED NOT DETECTED   Adenovirus F40/41 NOT DETECTED NOT DETECTED   Astrovirus NOT DETECTED NOT DETECTED   Norovirus GI/GII NOT DETECTED NOT DETECTED   Rotavirus A NOT DETECTED NOT DETECTED   Sapovirus (I, II, IV, and V) NOT DETECTED NOT DETECTED  C Difficile Quick Screen w PCR reflex  Result Value Ref Range   C Diff antigen NEGATIVE NEGATIVE   C Diff toxin NEGATIVE NEGATIVE   C Diff interpretation No C. difficile detected.   Lipase, blood  Result Value Ref Range   Lipase 22 11 - 51 U/L  Comprehensive metabolic panel  Result Value Ref Range   Sodium 135 135 - 145 mmol/L   Potassium 3.5 3.5 - 5.1 mmol/L   Chloride 101 98 - 111 mmol/L   CO2 21 (L) 22 - 32 mmol/L   Glucose, Bld 245 (H) 70 - 99 mg/dL   BUN 21 8 - 23 mg/dL   Creatinine, Ser 1.15 0.61 - 1.24 mg/dL   Calcium 8.8 (L) 8.9 - 10.3 mg/dL   Total Protein 7.3 6.5 - 8.1 g/dL   Albumin 3.7 3.5 - 5.0 g/dL   AST 22 15 - 41 U/L   ALT 15 0 - 44 U/L   Alkaline Phosphatase 81 38 - 126 U/L   Total Bilirubin 0.7 0.3 - 1.2 mg/dL   GFR calc non Af Amer >60 >60 mL/min   GFR calc Af Amer >60 >60 mL/min   Anion gap 13 5 - 15  CBC  Result Value Ref Range   WBC 4.1 4.0 - 10.5 K/uL   RBC 4.39 4.22 - 5.81 MIL/uL   Hemoglobin 13.4 13.0 - 17.0 g/dL   HCT 39.7 39.0 - 52.0 %   MCV 90.4 80.0 - 100.0 fL   MCH 30.5 26.0 - 34.0 pg   MCHC 33.8 30.0 - 36.0 g/dL   RDW 14.4 11.5 - 15.5 %   Platelets 163 150 - 400 K/uL   nRBC 0.0 0.0 - 0.2 %  Urinalysis, Complete w Microscopic  Result Value Ref Range   Color, Urine AMBER (A)  YELLOW   APPearance HAZY (A) CLEAR   Specific Gravity, Urine 1.035 (H) 1.005 - 1.030   pH 5.0 5.0 - 8.0   Glucose, UA 150 (A) NEGATIVE mg/dL   Hgb urine dipstick NEGATIVE NEGATIVE   Bilirubin Urine NEGATIVE NEGATIVE   Ketones, ur NEGATIVE NEGATIVE mg/dL   Protein, ur 100 (A) NEGATIVE mg/dL   Nitrite NEGATIVE NEGATIVE   Leukocytes,Ua NEGATIVE NEGATIVE   RBC / HPF 0-5 0 - 5 RBC/hpf   WBC, UA 0-5 0 - 5 WBC/hpf   Bacteria, UA RARE (A) NONE  SEEN   Squamous Epithelial / LPF 0-5 0 - 5   Mucus PRESENT    Hyaline Casts, UA PRESENT   Glucose, capillary  Result Value Ref Range   Glucose-Capillary 210 (H) 70 - 99 mg/dL  Hemoglobin A1c  Result Value Ref Range   Hgb A1c MFr Bld 6.7 (H) 4.8 - 5.6 %   Mean Plasma Glucose 145.59 mg/dL  CBC  Result Value Ref Range   WBC 4.9 4.0 - 10.5 K/uL   RBC 4.22 4.22 - 5.81 MIL/uL   Hemoglobin 12.7 (L) 13.0 - 17.0 g/dL   HCT 38.1 (L) 39.0 - 52.0 %   MCV 90.3 80.0 - 100.0 fL   MCH 30.1 26.0 - 34.0 pg   MCHC 33.3 30.0 - 36.0 g/dL   RDW 14.6 11.5 - 15.5 %   Platelets 162 150 - 400 K/uL   nRBC 0.0 0.0 - 0.2 %  Comprehensive metabolic panel  Result Value Ref Range   Sodium 136 135 - 145 mmol/L   Potassium 3.5 3.5 - 5.1 mmol/L   Chloride 103 98 - 111 mmol/L   CO2 21 (L) 22 - 32 mmol/L   Glucose, Bld 199 (H) 70 - 99 mg/dL   BUN 31 (H) 8 - 23 mg/dL   Creatinine, Ser 1.29 (H) 0.61 - 1.24 mg/dL   Calcium 8.5 (L) 8.9 - 10.3 mg/dL   Total Protein 6.8 6.5 - 8.1 g/dL   Albumin 3.4 (L) 3.5 - 5.0 g/dL   AST 25 15 - 41 U/L   ALT 19 0 - 44 U/L   Alkaline Phosphatase 71 38 - 126 U/L   Total Bilirubin 0.6 0.3 - 1.2 mg/dL   GFR calc non Af Amer 54 (L) >60 mL/min   GFR calc Af Amer >60 >60 mL/min   Anion gap 12 5 - 15  Glucose, capillary  Result Value Ref Range   Glucose-Capillary 225 (H) 70 - 99 mg/dL  Glucose, capillary  Result Value Ref Range   Glucose-Capillary 183 (H) 70 - 99 mg/dL  Glucose, capillary  Result Value Ref Range   Glucose-Capillary 177  (H) 70 - 99 mg/dL  Magnesium  Result Value Ref Range   Magnesium 1.6 (L) 1.7 - 2.4 mg/dL  Glucose, capillary  Result Value Ref Range   Glucose-Capillary 177 (H) 70 - 99 mg/dL  Basic metabolic panel  Result Value Ref Range   Sodium 137 135 - 145 mmol/L   Potassium 3.5 3.5 - 5.1 mmol/L   Chloride 104 98 - 111 mmol/L   CO2 23 22 - 32 mmol/L   Glucose, Bld 176 (H) 70 - 99 mg/dL   BUN 44 (H) 8 - 23 mg/dL   Creatinine, Ser 1.42 (H) 0.61 - 1.24 mg/dL   Calcium 8.7 (L) 8.9 - 10.3 mg/dL   GFR calc non Af Amer 48 (L) >60 mL/min   GFR calc Af Amer 56 (L) >60 mL/min   Anion gap 10 5 - 15  Glucose, capillary  Result Value Ref Range   Glucose-Capillary 181 (H) 70 - 99 mg/dL  Glucose, capillary  Result Value Ref Range   Glucose-Capillary 167 (H) 70 - 99 mg/dL  Glucose, capillary  Result Value Ref Range   Glucose-Capillary 166 (H) 70 - 99 mg/dL  Glucose, capillary  Result Value Ref Range   Glucose-Capillary 201 (H) 70 - 99 mg/dL  Basic metabolic panel  Result Value Ref Range   Sodium 135 135 - 145 mmol/L   Potassium 2.6 (LL) 3.5 -  5.1 mmol/L   Chloride 101 98 - 111 mmol/L   CO2 23 22 - 32 mmol/L   Glucose, Bld 153 (H) 70 - 99 mg/dL   BUN 43 (H) 8 - 23 mg/dL   Creatinine, Ser 1.32 (H) 0.61 - 1.24 mg/dL   Calcium 8.5 (L) 8.9 - 10.3 mg/dL   GFR calc non Af Amer 52 (L) >60 mL/min   GFR calc Af Amer >60 >60 mL/min   Anion gap 11 5 - 15  Glucose, capillary  Result Value Ref Range   Glucose-Capillary 170 (H) 70 - 99 mg/dL  Glucose, capillary  Result Value Ref Range   Glucose-Capillary 154 (H) 70 - 99 mg/dL  Magnesium  Result Value Ref Range   Magnesium 1.9 1.7 - 2.4 mg/dL  Basic metabolic panel  Result Value Ref Range   Sodium 133 (L) 135 - 145 mmol/L   Potassium 3.3 (L) 3.5 - 5.1 mmol/L   Chloride 102 98 - 111 mmol/L   CO2 22 22 - 32 mmol/L   Glucose, Bld 184 (H) 70 - 99 mg/dL   BUN 40 (H) 8 - 23 mg/dL   Creatinine, Ser 1.17 0.61 - 1.24 mg/dL   Calcium 8.5 (L) 8.9 - 10.3  mg/dL   GFR calc non Af Amer >60 >60 mL/min   GFR calc Af Amer >60 >60 mL/min   Anion gap 9 5 - 15  Glucose, capillary  Result Value Ref Range   Glucose-Capillary 162 (H) 70 - 99 mg/dL   Comment 1 Notify RN   Glucose, capillary  Result Value Ref Range   Glucose-Capillary 154 (H) 70 - 99 mg/dL  Glucose, capillary  Result Value Ref Range   Glucose-Capillary 209 (H) 70 - 99 mg/dL  Basic metabolic panel  Result Value Ref Range   Sodium 136 135 - 145 mmol/L   Potassium 3.2 (L) 3.5 - 5.1 mmol/L   Chloride 104 98 - 111 mmol/L   CO2 25 22 - 32 mmol/L   Glucose, Bld 176 (H) 70 - 99 mg/dL   BUN 35 (H) 8 - 23 mg/dL   Creatinine, Ser 1.24 0.61 - 1.24 mg/dL   Calcium 8.7 (L) 8.9 - 10.3 mg/dL   GFR calc non Af Amer 57 (L) >60 mL/min   GFR calc Af Amer >60 >60 mL/min   Anion gap 7 5 - 15  Magnesium  Result Value Ref Range   Magnesium 2.1 1.7 - 2.4 mg/dL  Glucose, capillary  Result Value Ref Range   Glucose-Capillary 154 (H) 70 - 99 mg/dL      Assessment & Plan:   Problem List Items Addressed This Visit      Cardiovascular and Mediastinum   Hypertension    Discuss hypertension and medication patient noted his blood pressures down will stop both Benzapril and amlodipine and will restart his blood pressure starts going back up.  Patient indicates he understands and will comply.        Digestive   Salmonella gastroenteritis    Reviewed importance of finishing antibiotics avoiding milk and milk products not using Imodium. Discussed fluid hydration and drinking enough fluid for urinating and clear urine.         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          Follow up plan: Return in about 1 week (around 07/31/2018).

## 2018-07-24 NOTE — Assessment & Plan Note (Signed)
Reviewed importance of finishing antibiotics avoiding milk and milk products not using Imodium. Discussed fluid hydration and drinking enough fluid for urinating and clear urine.

## 2018-07-24 NOTE — Assessment & Plan Note (Signed)
Discussed diabetes and illness patient will do better with diet nutrition and continue medications.

## 2018-07-24 NOTE — Assessment & Plan Note (Signed)
Discuss hypertension and medication patient noted his blood pressures down will stop both Benzapril and amlodipine and will restart his blood pressure starts going back up.  Patient indicates he understands and will comply.

## 2018-07-29 ENCOUNTER — Ambulatory Visit (INDEPENDENT_AMBULATORY_CARE_PROVIDER_SITE_OTHER): Payer: Medicare Other | Admitting: Family Medicine

## 2018-07-29 ENCOUNTER — Other Ambulatory Visit: Payer: Self-pay

## 2018-07-29 ENCOUNTER — Encounter: Payer: Self-pay | Admitting: Family Medicine

## 2018-07-29 DIAGNOSIS — I480 Paroxysmal atrial fibrillation: Secondary | ICD-10-CM | POA: Diagnosis not present

## 2018-07-29 DIAGNOSIS — E1143 Type 2 diabetes mellitus with diabetic autonomic (poly)neuropathy: Secondary | ICD-10-CM

## 2018-07-29 DIAGNOSIS — I10 Essential (primary) hypertension: Secondary | ICD-10-CM

## 2018-07-29 NOTE — Assessment & Plan Note (Signed)
The current medical regimen is effective;  continue present plan and medications.  

## 2018-07-29 NOTE — Progress Notes (Addendum)
BP 120/70    Subjective:    Patient ID: Sean Hacker., male    DOB: 1941/02/05, 78 y.o.   MRN: [id number]  HPI: Sean Bernard. is a 78 y.o. male  Recheck diarrhea Discussed with patient doing much better no further diarrhea energy is not back yet but is feeling better every day and starting to make progress.  Patient's blood pressure remains good not back on blood pressure medications and blood pressure remains normal. Dehydration has resolved and drinking a lot of fluids.    Relevant past medical, surgical, family and social history reviewed and updated as indicated. Interim medical history since our last visit reviewed. Allergies and medications reviewed and updated.  Review of Systems  Constitutional: Negative.   Respiratory: Negative.   Cardiovascular: Negative.     Per HPI unless specifically indicated above     Objective:    BP 120/70   Wt Readings from Last 3 Encounters:  07/22/18 176 lb 2.4 oz (79.9 kg)  07/19/18 190 lb (86.2 kg)  04/29/18 189 lb 6 oz (85.9 kg)    Physical Exam  Results for orders placed or performed during the hospital encounter of 07/19/18  SARS Coronavirus 2 (CEPHEID- Performed in [place] hospital lab), [facility] Order   Specimen: Nasopharyngeal Swab  Result Value Ref Range   SARS Coronavirus 2 NEGATIVE NEGATIVE  Gastrointestinal Panel by PCR , Stool   Specimen: Stool  Result Value Ref Range   Campylobacter species NOT DETECTED NOT DETECTED   Plesimonas shigelloides NOT DETECTED NOT DETECTED   Salmonella species DETECTED (A) NOT DETECTED   Yersinia enterocolitica NOT DETECTED NOT DETECTED   Vibrio species NOT DETECTED NOT DETECTED   Vibrio cholerae NOT DETECTED NOT DETECTED   Enteroaggregative E coli (EAEC) NOT DETECTED NOT DETECTED   Enteropathogenic E coli (EPEC) NOT DETECTED NOT DETECTED   Enterotoxigenic E coli (ETEC) NOT DETECTED NOT DETECTED   Shiga like toxin producing E coli (STEC) NOT DETECTED NOT DETECTED   Shigella/Enteroinvasive E coli (EIEC) NOT DETECTED NOT DETECTED   Cryptosporidium NOT DETECTED NOT DETECTED   Cyclospora cayetanensis NOT DETECTED NOT DETECTED   Entamoeba histolytica NOT DETECTED NOT DETECTED   Giardia lamblia NOT DETECTED NOT DETECTED   Adenovirus F40/41 NOT DETECTED NOT DETECTED   Astrovirus NOT DETECTED NOT DETECTED   Norovirus GI/GII NOT DETECTED NOT DETECTED   Rotavirus A NOT DETECTED NOT DETECTED   Sapovirus (I, II, IV, and V) NOT DETECTED NOT DETECTED  C Difficile Quick Screen w PCR reflex   Specimen: Stool  Result Value Ref Range   C Diff antigen NEGATIVE NEGATIVE   C Diff toxin NEGATIVE NEGATIVE   C Diff interpretation No C. difficile detected.   Lipase, blood  Result Value Ref Range   Lipase 22 11 - 51 U/L  Comprehensive metabolic panel  Result Value Ref Range   Sodium 135 135 - 145 mmol/L   Potassium 3.5 3.5 - 5.1 mmol/L   Chloride 101 98 - 111 mmol/L   CO2 21 (L) 22 - 32 mmol/L   Glucose, Bld 245 (H) 70 - 99 mg/dL   BUN 21 8 - 23 mg/dL   Creatinine, Ser 1.15 0.61 - 1.24 mg/dL   Calcium 8.8 (L) 8.9 - 10.3 mg/dL   Total Protein 7.3 6.5 - 8.1 g/dL   Albumin 3.7 3.5 - 5.0 g/dL   AST 22 15 - 41 U/L   ALT 15 0 - 44 U/L   Alkaline Phosphatase 81 38 -  126 U/L   Total Bilirubin 0.7 0.3 - 1.2 mg/dL   GFR calc non Af Amer >60 >60 mL/min   GFR calc Af Amer >60 >60 mL/min   Anion gap 13 5 - 15  CBC  Result Value Ref Range   WBC 4.1 4.0 - 10.5 K/uL   RBC 4.39 4.22 - 5.81 MIL/uL   Hemoglobin 13.4 13.0 - 17.0 g/dL   HCT 39.7 39.0 - 52.0 %   MCV 90.4 80.0 - 100.0 fL   MCH 30.5 26.0 - 34.0 pg   MCHC 33.8 30.0 - 36.0 g/dL   RDW 14.4 11.5 - 15.5 %   Platelets 163 150 - 400 K/uL   nRBC 0.0 0.0 - 0.2 %  Urinalysis, Complete w Microscopic  Result Value Ref Range   Color, Urine AMBER (A) YELLOW   APPearance HAZY (A) CLEAR   Specific Gravity, Urine 1.035 (H) 1.005 - 1.030   pH 5.0 5.0 - 8.0   Glucose, UA 150 (A) NEGATIVE mg/dL   Hgb urine dipstick  NEGATIVE NEGATIVE   Bilirubin Urine NEGATIVE NEGATIVE   Ketones, ur NEGATIVE NEGATIVE mg/dL   Protein, ur 100 (A) NEGATIVE mg/dL   Nitrite NEGATIVE NEGATIVE   Leukocytes,Ua NEGATIVE NEGATIVE   RBC / HPF 0-5 0 - 5 RBC/hpf   WBC, UA 0-5 0 - 5 WBC/hpf   Bacteria, UA RARE (A) NONE SEEN   Squamous Epithelial / LPF 0-5 0 - 5   Mucus PRESENT    Hyaline Casts, UA PRESENT   Glucose, capillary  Result Value Ref Range   Glucose-Capillary 210 (H) 70 - 99 mg/dL  Hemoglobin A1c  Result Value Ref Range   Hgb A1c MFr Bld 6.7 (H) 4.8 - 5.6 %   Mean Plasma Glucose 145.59 mg/dL  CBC  Result Value Ref Range   WBC 4.9 4.0 - 10.5 K/uL   RBC 4.22 4.22 - 5.81 MIL/uL   Hemoglobin 12.7 (L) 13.0 - 17.0 g/dL   HCT 38.1 (L) 39.0 - 52.0 %   MCV 90.3 80.0 - 100.0 fL   MCH 30.1 26.0 - 34.0 pg   MCHC 33.3 30.0 - 36.0 g/dL   RDW 14.6 11.5 - 15.5 %   Platelets 162 150 - 400 K/uL   nRBC 0.0 0.0 - 0.2 %  Comprehensive metabolic panel  Result Value Ref Range   Sodium 136 135 - 145 mmol/L   Potassium 3.5 3.5 - 5.1 mmol/L   Chloride 103 98 - 111 mmol/L   CO2 21 (L) 22 - 32 mmol/L   Glucose, Bld 199 (H) 70 - 99 mg/dL   BUN 31 (H) 8 - 23 mg/dL   Creatinine, Ser 1.29 (H) 0.61 - 1.24 mg/dL   Calcium 8.5 (L) 8.9 - 10.3 mg/dL   Total Protein 6.8 6.5 - 8.1 g/dL   Albumin 3.4 (L) 3.5 - 5.0 g/dL   AST 25 15 - 41 U/L   ALT 19 0 - 44 U/L   Alkaline Phosphatase 71 38 - 126 U/L   Total Bilirubin 0.6 0.3 - 1.2 mg/dL   GFR calc non Af Amer 54 (L) >60 mL/min   GFR calc Af Amer >60 >60 mL/min   Anion gap 12 5 - 15  Glucose, capillary  Result Value Ref Range   Glucose-Capillary 225 (H) 70 - 99 mg/dL  Glucose, capillary  Result Value Ref Range   Glucose-Capillary 183 (H) 70 - 99 mg/dL  Glucose, capillary  Result Value Ref Range   Glucose-Capillary 177 (  H) 70 - 99 mg/dL  Magnesium  Result Value Ref Range   Magnesium 1.6 (L) 1.7 - 2.4 mg/dL  Glucose, capillary  Result Value Ref Range   Glucose-Capillary 177 (H)  70 - 99 mg/dL  Basic metabolic panel  Result Value Ref Range   Sodium 137 135 - 145 mmol/L   Potassium 3.5 3.5 - 5.1 mmol/L   Chloride 104 98 - 111 mmol/L   CO2 23 22 - 32 mmol/L   Glucose, Bld 176 (H) 70 - 99 mg/dL   BUN 44 (H) 8 - 23 mg/dL   Creatinine, Ser 1.42 (H) 0.61 - 1.24 mg/dL   Calcium 8.7 (L) 8.9 - 10.3 mg/dL   GFR calc non Af Amer 48 (L) >60 mL/min   GFR calc Af Amer 56 (L) >60 mL/min   Anion gap 10 5 - 15  Glucose, capillary  Result Value Ref Range   Glucose-Capillary 181 (H) 70 - 99 mg/dL  Glucose, capillary  Result Value Ref Range   Glucose-Capillary 167 (H) 70 - 99 mg/dL  Glucose, capillary  Result Value Ref Range   Glucose-Capillary 166 (H) 70 - 99 mg/dL  Glucose, capillary  Result Value Ref Range   Glucose-Capillary 201 (H) 70 - 99 mg/dL  Basic metabolic panel  Result Value Ref Range   Sodium 135 135 - 145 mmol/L   Potassium 2.6 (LL) 3.5 - 5.1 mmol/L   Chloride 101 98 - 111 mmol/L   CO2 23 22 - 32 mmol/L   Glucose, Bld 153 (H) 70 - 99 mg/dL   BUN 43 (H) 8 - 23 mg/dL   Creatinine, Ser 1.32 (H) 0.61 - 1.24 mg/dL   Calcium 8.5 (L) 8.9 - 10.3 mg/dL   GFR calc non Af Amer 52 (L) >60 mL/min   GFR calc Af Amer >60 >60 mL/min   Anion gap 11 5 - 15  Glucose, capillary  Result Value Ref Range   Glucose-Capillary 170 (H) 70 - 99 mg/dL  Glucose, capillary  Result Value Ref Range   Glucose-Capillary 154 (H) 70 - 99 mg/dL  Magnesium  Result Value Ref Range   Magnesium 1.9 1.7 - 2.4 mg/dL  Basic metabolic panel  Result Value Ref Range   Sodium 133 (L) 135 - 145 mmol/L   Potassium 3.3 (L) 3.5 - 5.1 mmol/L   Chloride 102 98 - 111 mmol/L   CO2 22 22 - 32 mmol/L   Glucose, Bld 184 (H) 70 - 99 mg/dL   BUN 40 (H) 8 - 23 mg/dL   Creatinine, Ser 1.17 0.61 - 1.24 mg/dL   Calcium 8.5 (L) 8.9 - 10.3 mg/dL   GFR calc non Af Amer >60 >60 mL/min   GFR calc Af Amer >60 >60 mL/min   Anion gap 9 5 - 15  Glucose, capillary  Result Value Ref Range   Glucose-Capillary  162 (H) 70 - 99 mg/dL   Comment 1 Notify RN   Glucose, capillary  Result Value Ref Range   Glucose-Capillary 154 (H) 70 - 99 mg/dL  Glucose, capillary  Result Value Ref Range   Glucose-Capillary 209 (H) 70 - 99 mg/dL  Basic metabolic panel  Result Value Ref Range   Sodium 136 135 - 145 mmol/L   Potassium 3.2 (L) 3.5 - 5.1 mmol/L   Chloride 104 98 - 111 mmol/L   CO2 25 22 - 32 mmol/L   Glucose, Bld 176 (H) 70 - 99 mg/dL   BUN 35 (H) 8 - 23 mg/dL  Creatinine, Ser 1.24 0.61 - 1.24 mg/dL   Calcium 8.7 (L) 8.9 - 10.3 mg/dL   GFR calc non Af Amer 57 (L) >60 mL/min   GFR calc Af Amer >60 >60 mL/min   Anion gap 7 5 - 15  Magnesium  Result Value Ref Range   Magnesium 2.1 1.7 - 2.4 mg/dL  Glucose, capillary  Result Value Ref Range   Glucose-Capillary 154 (H) 70 - 99 mg/dL      Assessment & Plan:   Problem List Items Addressed This Visit      Cardiovascular and Mediastinum   Atrial fibrillation ([place])    The current medical regimen is effective;  continue present plan and medications.       Hypertension    The current medical regimen is effective;  continue present plan and medications.         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           Follow up plan: Return for Physical Exam, Hemoglobin A1c.

## 2018-07-30 NOTE — Addendum Note (Signed)
Addended by: Golden Pop A on: 07/30/2018 08:42 AM   Modules accepted: Level of Service

## 2018-08-06 ENCOUNTER — Telehealth: Payer: Self-pay

## 2018-08-06 ENCOUNTER — Ambulatory Visit: Payer: Self-pay | Admitting: *Deleted

## 2018-08-06 DIAGNOSIS — E1143 Type 2 diabetes mellitus with diabetic autonomic (poly)neuropathy: Secondary | ICD-10-CM

## 2018-08-06 DIAGNOSIS — I480 Paroxysmal atrial fibrillation: Secondary | ICD-10-CM

## 2018-08-06 DIAGNOSIS — A020 Salmonella enteritis: Secondary | ICD-10-CM

## 2018-08-06 NOTE — Patient Instructions (Signed)
Thank you allowing the Chronic Care Management Team to be a part of your care! It was a pleasure speaking with you today! CCM (Chronic Care Management) Team   Lemmie Vanlanen RN, BSN Nurse Care Coordinator  [phone]  [facility] PharmD  Clinical Pharmacist  [phone]  Eula Fried LCSW Clinical Social Worker [phone]  Goals Addressed            This Visit's Progress   . I want to stay healthy during this virus (pt-stated)       Current Barriers:  [name] Knowledge Deficits related to COVID-19 and impact on patient self health management  Clinical Goal(s):  [name] Over the next 30 days, patient will verbalize basic understanding of COVID-19 impact on individual health and self health management as evidenced by verbalization of basic understanding of COVID-19 as a viral disease, measures to prevent exposure, signs and symptoms, when to contact provider  Interventions: . Call to patient to f/u on recent hospitalization for salmonella/diarrhea . Patient c/o bouts of afib, patient has called cardiologist to f/u . Patient stating diarrheal has improved as long as he does not drink milk . Patient stating he is being careful not to be around people in his weakened state to not catch Covid-19 . Patient stated his blood sugar with in good control 120-130's   . Patient stated the isolation has been hard for him because he is a people person, but he understands. He also reports supportive family.   Patient Self Care Activities:  . Self administers medications as prescribed . Performs ADL's independently  Please see past updates related to this goal by clicking on the "Past Updates" button in the selected goal                       The patient verbalized understanding of instructions provided today and declined a print copy of patient instruction materials.   The care management team will reach out to the patient again over the next 30 days.  The patient has been  provided with contact information for the care management team and has been advised to call with any health related questions or concerns.     Atrial Fibrillation  Atrial fibrillation is a type of heartbeat that is irregular or fast (rapid). If you have this condition, your heart beats without any order. This makes it hard for your heart to pump blood in a normal way. Having this condition gives you more risk for stroke, heart failure, and other heart problems. Atrial fibrillation may start all of a sudden and then stop on its own, or it may become a long-lasting problem. What are the causes? This condition may be caused by heart conditions, such as:  High blood pressure.  Heart failure.  Heart valve disease.  Heart surgery. Other causes include:  Pneumonia.  Obstructive sleep apnea.  Lung cancer.  Thyroid disease.  Drinking too much alcohol. Sometimes the cause is not known. What increases the risk? You are more likely to develop this condition if:  You smoke.  You are older.  You have diabetes.  You are overweight.  You have a family history of this condition.  You exercise often and hard. What are the signs or symptoms? Common symptoms of this condition include:  A feeling like your heart is beating very fast.  Chest pain.  Feeling short of breath.  Feeling light-headed or weak.  Getting tired easily. Follow these instructions at home: Medicines  Take over-the-counter and prescription medicines only as told by your doctor.  If your doctor gives you a blood-thinning medicine, take it exactly as told. Taking too much of it can cause bleeding. Taking too little of it does not protect you against clots. Clots can cause a stroke. Lifestyle      Do not use any tobacco products. These include cigarettes, chewing tobacco, and e-cigarettes. If you need help quitting, ask your doctor.  Do not drink alcohol.  Do not drink beverages that have caffeine.  These include coffee, soda, and tea.  Follow diet instructions as told by your doctor.  Exercise regularly as told by your doctor. General instructions  If you have a condition that causes breathing to stop for a short period of time (apnea), treat it as told by your doctor.  Keep a healthy weight. Do not use diet pills unless your doctor says they are safe for you. Diet pills may make heart problems worse.  Keep all follow-up visits as told by your doctor. This is important. Contact a doctor if:  You notice a change in the speed, rhythm, or strength of your heartbeat.  You are taking a blood-thinning medicine and you see more bruising.  You get tired more easily when you move or exercise.  You have a sudden change in weight. Get help right away if:   You have pain in your chest or your belly (abdomen).  You have trouble breathing.  You have blood in your vomit, poop, or pee (urine).  You have any signs of a stroke. "BE FAST" is an easy way to remember the main warning signs: ? B - Balance. Signs are dizziness, sudden trouble walking, or loss of balance. ? E - Eyes. Signs are trouble seeing or a change in how you see. ? F - Face. Signs are sudden weakness or loss of feeling in the face, or the face or eyelid drooping on one side. ? A - Arms. Signs are weakness or loss of feeling in an arm. This happens suddenly and usually on one side of the body. ? S - Speech. Signs are sudden trouble speaking, slurred speech, or trouble understanding what people say. ? T - Time. Time to call emergency services. Write down what time symptoms started.  You have other signs of a stroke, such as: ? A sudden, very bad headache with no known cause. ? Feeling sick to your stomach (nausea). ? Throwing up (vomiting). ? Jerky movements you cannot control (seizure). These symptoms may be an emergency. Do not wait to see if the symptoms will go away. Get medical help right away. Call your local  emergency services (911 in the [place] Do not drive yourself to the hospital. Summary  Atrial fibrillation is a type of heartbeat that is irregular or fast (rapid).  You are at higher risk of this condition if you smoke, are older, have diabetes, or are overweight.  Follow your doctor's instructions about medicines, diet, exercise, and follow-up visits.  Get help right away if you think that you have signs of a stroke. This information is not intended to replace advice given to you by your health care provider. Make sure you discuss any questions you have with your health care provider. Document Released: 11/09/2007 Document Revised: 03/23/2017 Document Reviewed: 03/23/2017 Elsevier Interactive Patient Education  2019 [employer].

## 2018-08-06 NOTE — Chronic Care Management (AMB) (Signed)
  Chronic Care Management   Follow Up Note   08/06/2018 Name: Sean Reyes. MRN: [id number] DOB: 1940/04/23  Referred by: [name], MD Reason for referral : Chronic Care Management ( DM, Salmonella, AFIB)   [name]. is a 78 y.o. year old male who is a primary care patient of Crissman, Jeannette How, MD. The CCM team was consulted for assistance with chronic disease management and care coordination needs.    Review of patient status, including review of consultants reports, relevant laboratory and other test results, and collaboration with appropriate care team members and the patient's provider was performed as part of comprehensive patient evaluation and provision of chronic care management services.    Goals Addressed            This Visit's Progress   . I want to stay healthy during this virus (pt-stated)       Current Barriers:  [name] Knowledge Deficits related to COVID-19 and impact on patient self health management  Clinical Goal(s):  [name] Over the next 30 days, patient will verbalize basic understanding of COVID-19 impact on individual health and self health management as evidenced by verbalization of basic understanding of COVID-19 as a viral disease, measures to prevent exposure, signs and symptoms, when to contact provider  Interventions: . Call to patient to f/u on recent hospitalization for salmonella/diarrhea . Patient c/o bouts of afib, patient has called cardiologist to f/u and has also started using Cpap machine more regularly . Patient stating diarrheal has improved as long as he does not drink milk . Patient stating he is being careful not to be around people in his weakened state to not catch Covid-19 . Patient stated his blood sugar with in good control 120-130's   . Patient stated the isolation has been hard for him because he is a people person, but he understands. He also reports supportive family.   Patient Self Care Activities:  . Self administers  medications as prescribed . Performs ADL's independently  Please see past updates related to this goal by clicking on the "Past Updates" button in the selected goal                        The care management team will reach out to the patient again over the next 30 days.  The patient has been provided with contact information for the care management team and has been advised to call with any health related questions or concerns.   Merlene Morse Alain Deschene RN, BSN Nurse Case Editor, [facility]  [phone]) Business Mobile

## 2018-08-12 ENCOUNTER — Ambulatory Visit (INDEPENDENT_AMBULATORY_CARE_PROVIDER_SITE_OTHER): Payer: Medicare Other

## 2018-08-12 VITALS — BP 140/82

## 2018-08-12 DIAGNOSIS — M461 Sacroiliitis, not elsewhere classified: Secondary | ICD-10-CM | POA: Diagnosis not present

## 2018-08-12 DIAGNOSIS — Z0000 Encounter for general adult medical examination without abnormal findings: Secondary | ICD-10-CM | POA: Diagnosis not present

## 2018-08-12 DIAGNOSIS — M5440 Lumbago with sciatica, unspecified side: Secondary | ICD-10-CM | POA: Diagnosis not present

## 2018-08-12 NOTE — Patient Instructions (Addendum)
[name] , Thank you for taking time to come for your Medicare Wellness Visit. I appreciate your ongoing commitment to your health goals. Please review the following plan we discussed and let me know if I can assist you in the future.   Screening recommendations/referrals: Colonoscopy: no longer required  Recommended yearly ophthalmology/optometry visit for glaucoma screening and checkup Recommended yearly dental visit for hygiene and checkup  Vaccinations: Influenza vaccine: up to date  Pneumococcal vaccine: up to date  Tdap vaccine: up to date  Shingles vaccine: completed   Advanced directives: Please bring a copy of your health care power of attorney and living will to the office at your convenience.  Conditions/risks identified: fall prevention discussed due to back pain.   Next appointment: Follow up in one year for your annual wellness exam.   Preventive Care 65 Years and Older, Male Preventive care refers to lifestyle choices and visits with your health care provider that can promote health and wellness. What does preventive care include?  A yearly physical exam. This is also called an annual well check.  Dental exams once or twice a year.  Routine eye exams. Ask your health care provider how often you should have your eyes checked.  Personal lifestyle choices, including:  Daily care of your teeth and gums.  Regular physical activity.  Eating a healthy diet.  Avoiding tobacco and drug use.  Limiting alcohol use.  Practicing safe sex.  Taking low doses of aspirin every day.  Taking vitamin and mineral supplements as recommended by your health care provider. What happens during an annual well check? The services and screenings done by your health care provider during your annual well check will depend on your age, overall health, lifestyle risk factors, and family history of disease. Counseling  Your health care provider may ask you questions about your:   Alcohol use.  Tobacco use.  Drug use.  Emotional well-being.  Home and relationship well-being.  Sexual activity.  Eating habits.  History of falls.  Memory and ability to understand (cognition).  Work and work Statistician. Screening  You may have the following tests or measurements:  Height, weight, and BMI.  Blood pressure.  Lipid and cholesterol levels. These may be checked every 5 years, or more frequently if you are over 70 years old.  Skin check.  Lung cancer screening. You may have this screening every year starting at age 32 if you have a 30-pack-year history of smoking and currently smoke or have quit within the past 15 years.  Fecal occult blood test (FOBT) of the stool. You may have this test every year starting at age 79.  Flexible sigmoidoscopy or colonoscopy. You may have a sigmoidoscopy every 5 years or a colonoscopy every 10 years starting at age 29.  Prostate cancer screening. Recommendations will vary depending on your family history and other risks.  Hepatitis C blood test.  Hepatitis B blood test.  Sexually transmitted disease (STD) testing.  Diabetes screening. This is done by checking your blood sugar (glucose) after you have not eaten for a while (fasting). You may have this done every 1-3 years.  Abdominal aortic aneurysm (AAA) screening. You may need this if you are a current or former smoker.  Osteoporosis. You may be screened starting at age 64 if you are at high risk. Talk with your health care provider about your test results, treatment options, and if necessary, the need for more tests. Vaccines  Your health care provider may recommend certain  vaccines, such as:  Influenza vaccine. This is recommended every year.  Tetanus, diphtheria, and acellular pertussis (Tdap, Td) vaccine. You may need a Td booster every 10 years.  Zoster vaccine. You may need this after age 3.  Pneumococcal 13-valent conjugate (PCV13) vaccine. One dose is  recommended after age 55.  Pneumococcal polysaccharide (PPSV23) vaccine. One dose is recommended after age 23. Talk to your health care provider about which screenings and vaccines you need and how often you need them. This information is not intended to replace advice given to you by your health care provider. Make sure you discuss any questions you have with your health care provider. Document Released: 02/26/2015 Document Revised: 10/20/2015 Document Reviewed: 12/01/2014 Elsevier Interactive Patient Education  2017 [employer] Prevention in the Home Falls can cause injuries. They can happen to people of all ages. There are many things you can do to make your home safe and to help prevent falls. What can I do on the outside of my home?  Regularly fix the edges of walkways and driveways and fix any cracks.  Remove anything that might make you trip as you walk through a door, such as a raised step or threshold.  Trim any bushes or trees on the path to your home.  Use bright outdoor lighting.  Clear any walking paths of anything that might make someone trip, such as rocks or tools.  Regularly check to see if handrails are loose or broken. Make sure that both sides of any steps have handrails.  Any raised decks and porches should have guardrails on the edges.  Have any leaves, snow, or ice cleared regularly.  Use sand or salt on walking paths during winter.  Clean up any spills in your garage right away. This includes oil or grease spills. What can I do in the bathroom?  Use night lights.  Install grab bars by the toilet and in the tub and shower. Do not use towel bars as grab bars.  Use non-skid mats or decals in the tub or shower.  If you need to sit down in the shower, use a plastic, non-slip stool.  Keep the floor dry. Clean up any water that spills on the floor as soon as it happens.  Remove soap buildup in the tub or shower regularly.  Attach bath mats  securely with double-sided non-slip rug tape.  Do not have throw rugs and other things on the floor that can make you trip. What can I do in the bedroom?  Use night lights.  Make sure that you have a light by your bed that is easy to reach.  Do not use any sheets or blankets that are too big for your bed. They should not hang down onto the floor.  Have a firm chair that has side arms. You can use this for support while you get dressed.  Do not have throw rugs and other things on the floor that can make you trip. What can I do in the kitchen?  Clean up any spills right away.  Avoid walking on wet floors.  Keep items that you use a lot in easy-to-reach places.  If you need to reach something above you, use a strong step stool that has a grab bar.  Keep electrical cords out of the way.  Do not use floor polish or wax that makes floors slippery. If you must use wax, use non-skid floor wax.  Do not have throw rugs and other things  on the floor that can make you trip. What can I do with my stairs?  Do not leave any items on the stairs.  Make sure that there are handrails on both sides of the stairs and use them. Fix handrails that are broken or loose. Make sure that handrails are as long as the stairways.  Check any carpeting to make sure that it is firmly attached to the stairs. Fix any carpet that is loose or worn.  Avoid having throw rugs at the top or bottom of the stairs. If you do have throw rugs, attach them to the floor with carpet tape.  Make sure that you have a light switch at the top of the stairs and the bottom of the stairs. If you do not have them, ask someone to add them for you. What else can I do to help prevent falls?  Wear shoes that:  Do not have high heels.  Have rubber bottoms.  Are comfortable and fit you well.  Are closed at the toe. Do not wear sandals.  If you use a stepladder:  Make sure that it is fully opened. Do not climb a closed  stepladder.  Make sure that both sides of the stepladder are locked into place.  Ask someone to hold it for you, if possible.  Clearly mark and make sure that you can see:  Any grab bars or handrails.  First and last steps.  Where the edge of each step is.  Use tools that help you move around (mobility aids) if they are needed. These include:  Canes.  Walkers.  Scooters.  Crutches.  Turn on the lights when you go into a dark area. Replace any light bulbs as soon as they burn out.  Set up your furniture so you have a clear path. Avoid moving your furniture around.  If any of your floors are uneven, fix them.  If there are any pets around you, be aware of where they are.  Review your medicines with your doctor. Some medicines can make you feel dizzy. This can increase your chance of falling. Ask your doctor what other things that you can do to help prevent falls. This information is not intended to replace advice given to you by your health care provider. Make sure you discuss any questions you have with your health care provider. Document Released: 11/26/2008 Document Revised: 07/08/2015 Document Reviewed: 03/06/2014 Elsevier Interactive Patient Education  2017 [employer].

## 2018-08-12 NOTE — Progress Notes (Signed)
Subjective:   Sean Reyes. is a 78 y.o. male who presents for Medicare Annual/Subsequent preventive examination.  This visit is being conducted via phone call  - after an attmept to do on video chat - due to the COVID-19 pandemic. This patient has given me verbal consent via phone to conduct this visit, patient states they are participating from their home address. Some vital signs may be absent or patient reported.   Patient identification: identified by name, DOB, and current address.    Review of Systems:   Cardiac Risk Factors include: male gender;advanced age (>69men, >23 women);diabetes mellitus;dyslipidemia;hypertension     Objective:    Vitals: BP 140/82 Comment: patient reported  There is no height or weight on file to calculate BMI.  Advanced Directives 08/12/2018 07/22/2018 07/20/2018 07/20/2018 07/19/2018 07/19/2018 04/29/2018  Does Patient Have a Medical Advance Directive? Yes Yes - - - Yes Yes  Type of Advance Directive Living will;Healthcare Power of Attorney Living will Living will;Healthcare Power of Attorney Living will;Healthcare Power of Attorney Living will;Healthcare Power of Attorney Living will;Healthcare Power of [place];Living will  Does patient want to make changes to medical advance directive? - No - Patient declined - - - - No - Patient declined  Copy of [place] in Chart? No - copy requested No - copy requested No - copy requested - No - copy requested - -  Would patient like information on creating a medical advance directive? - - - - - - -    Tobacco Social History   Tobacco Use  Smoking Status Never Smoker  Smokeless Tobacco Never Used     Counseling given: Not Answered   Clinical Intake:                 Nutrition Risk Assessment:  Has the patient had any N/V/D within the last 2 months?  no Does the patient have any non-healing wounds?  No  Has the patient had any unintentional weight  loss or weight gain?  No   Diabetes:  Is the patient diabetic?  Yes  If diabetic, was a CBG obtained today?  No  Did the patient bring in their glucometer from home?  No  How often do you monitor your CBG's? Ever other day .   Financial Strains and Diabetes Management:  Are you having any financial strains with the device, your supplies or your medication? No .  Does the patient want to be seen by Chronic Care Management for management of their diabetes?  Yes  In contact with CCM team already.   Diabetic Exams:  Diabetic Eye Exam: Completed 04/24/2018. Overdue for diabetic eye exam. Pt has been advised about the importance in completing this exam. A referral has been placed today. Message sent to referral coordinator for scheduling purposes. Advised pt to expect a call from our office re: appt.  Diabetic Foot Exam: Pt has been advised about the importance in completing this exam.        Past Medical History:  Diagnosis Date  . Anemia   . Anxiety   . Arthritis   . Atrial fibrillation ([place])   . Cataracts, bilateral   . Complication of anesthesia    pt reports low BP's after surgery at [facility] and difficulty awakening  . Depression   . Diabetes ([place])    dx 6-8 yrs ago  . Dysrhythmia    a-fib  . GERD (gastroesophageal reflux disease)    OCC  TAKES ALKA SELTZER  . History of kidney stones    10-15 yrs ago  . 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 Hyperlipidemia   . Hypertension   . Nocturia   . S/P ablation of atrial fibrillation    Ablative therapy  . Sleep apnea    CPAP   . Tachycardia, unspecified    Past Surgical History:  Procedure Laterality Date  . ABLATION    . ANTERIOR LAT LUMBAR FUSION N/A 06/27/2017   Procedure: Anterior Lateral Lumbar Interbody  Fusion - Lumbar Two-Lumbar Three - Lumbar Three-Lumbar Four, Posterior Lumbar Interbody Fusion Lumbar Four- Five;  Surgeon: Kary Kos, MD;  Location: [place];  Service: Neurosurgery;  Laterality:  N/A;  Anterior Lateral Lumbar Interbody  Fusion - Lumbar Two-Lumbar Three - Lumbar Three-Lumbar Four, Posterior Lumbar Interbody Fusion Lumbar Four- Five  . BACK SURGERY    . COLONOSCOPY WITH PROPOFOL N/A 10/05/2015   Procedure: COLONOSCOPY WITH PROPOFOL;  Surgeon: Lollie Sails, MD;  Location: [facility] ENDOSCOPY;  Service: Endoscopy;  Laterality: N/A;  . ESOPHAGOGASTRODUODENOSCOPY (EGD) WITH PROPOFOL N/A 04/01/2018   Procedure: ESOPHAGOGASTRODUODENOSCOPY (EGD) WITH PROPOFOL;  Surgeon: Lollie Sails, MD;  Location: [facility] ENDOSCOPY;  Service: Endoscopy;  Laterality: N/A;  . HERNIA REPAIR    . JOINT REPLACEMENT Bilateral    hips  RT+  LEFT X2   . LUMBAR LAMINECTOMY/DECOMPRESSION MICRODISCECTOMY Left 09/13/2016   Procedure: Microdiscectomy - Lumbar two-three,  Lumbar three- - left;  Surgeon: Kary Kos, MD;  Location: [place];  Service: Neurosurgery;  Laterality: Left;  . TONSILLECTOMY     Family History  Problem Relation Age of Onset  . Brain cancer Mother   . Kidney disease Neg Hx   . Prostate cancer Neg Hx   . Kidney cancer Neg Hx   . Bladder Cancer Neg Hx    Social History   Socioeconomic History  . Marital status: Married    Spouse name: Not on file  . Number of children: Not on file  . Years of education: Not on file  . Highest education level: High school graduate  Occupational History  . Occupation: retired   Scientific laboratory technician  . Financial resource strain: Not very hard  . Food insecurity    Worry: Never true    Inability: Never true  . Transportation needs    Medical: No    Non-medical: No  Tobacco Use  . Smoking status: Never Smoker  . Smokeless tobacco: Never Used  Substance and Sexual Activity  . Alcohol use: No  . Drug use: No  . Sexual activity: Not on file  Lifestyle  . Physical activity    Days per week: 0 days    Minutes per session: 0 min  . Stress: Not at all  Relationships  . Social connections    Talks on phone: More than three times a week    Gets  together: More than three times a week    Attends religious service: More than 4 times per year    Active member of club or organization: No    Attends meetings of clubs or organizations: Never    Relationship status: Married  Other Topics Concern  . Not on file  Social History Narrative   Married   Gets regular exercise    Outpatient Encounter Medications as of 08/12/2018  Medication Sig  . amLODipine (NORVASC) 5 MG tablet Take 1 tablet (5 mg total) by mouth daily.  . benazepril (LOTENSIN) 40  MG tablet Take 1 tablet (40 mg total) by mouth daily.  . cetirizine (ZYRTEC) 10 MG tablet Take 10 mg by mouth daily.  [name] ELIQUIS 5 MG TABS tablet Take 5 mg by mouth 2 (two) times daily.   . Ensure Max Protein (ENSURE MAX PROTEIN) LIQD Take 330 mLs (11 oz total) by mouth 2 (two) times daily between meals.  . finasteride (PROSCAR) 5 MG tablet Take 1 tablet (5 mg total) by mouth daily.  . metFORMIN (GLUCOPHAGE-XR) 500 MG 24 hr tablet Take 2 tablets (1,000 mg total) by mouth 2 (two) times daily.  . metoprolol succinate (TOPROL-XL) 25 MG 24 hr tablet Take 1 tablet (25 mg total) by mouth 2 (two) times daily.  . pantoprazole (PROTONIX) 40 MG tablet Take 40 mg by mouth daily before breakfast.  . propafenone (RYTHMOL SR) 425 MG 12 hr capsule Take 425 mg by mouth 2 (two) times daily.   No facility-administered encounter medications on file as of 08/12/2018.     Activities of Daily Living In your present state of health, do you have any difficulty performing the following activities: 08/12/2018 07/19/2018  Hearing? Y N  Comment has bilateral hearing aids -  Vision? Y N  Comment has cataracts, had to reschedule eye appt due to back surgery -  Difficulty concentrating or making decisions? N N  Walking or climbing stairs? Y Y  Comment due to back pain -  Dressing or bathing? Tempie Donning  Comment due to back -  Doing errands, shopping? N N  Preparing Food and eating ? N -  Using the Toilet? N -  In the past six  months, have you accidently leaked urine? Y -  Comment overactive bladder, wears depends if out -  Do you have problems with loss of bowel control? N -  Managing your Medications? N -  Managing your Finances? N -  Housekeeping or managing your Housekeeping? Y -  Comment wife and daughter do -  Some recent data might be hidden    Patient Care Team: [name], MD as PCP - General (Family Medicine) [name], MD as PCP - Family Medicine (Family Medicine) Corey Skains, MD as Consulting Physician (Internal Medicine) Ralene Bathe, MD (Dermatology) Kary Kos, MD as Consulting Physician (Neurosurgery) Marry Guan, Laurice Record, MD (Orthopedic Surgery) Minor, Dalbert Garnet, RN as Case Manager De Hollings[place], [facility] as Pharmacist (Pharmacist) Marlaine Hind, MD as Consulting Physician (Physical Medicine and Rehabilitation) Christy Sartorius, MD as Referring Physician (Urology)   Assessment:   This is a routine wellness examination for Malcolm.  Exercise Activities and Dietary recommendations Current Exercise Habits: The patient does not participate in regular exercise at present, Exercise limited by: orthopedic condition(s)  Goals    . "My medications are expensive" (pt-stated)     Current Barriers:  . Financial Barriers- patient reported Eliquis and Rhythmol SR being expensive medications . Reports hx atrial fibrillation, but no known recurrence of arrhythmia since last ablation o Managed on metoprolol XL for rate control, Rhythmol SR for rhythm, and Eliquis for stroke ppx  Pharmacist Clinical Goal(s):  [name] Over the next 10 days, patient will work with PharmD to address needs related to medication access  Interventions: . Comprehensive medication review performed.  . Reviewed criteria for patient assistance programs for Eliquis ([place]) and Rhythmol ([place]). Unfortunately, patient's total household income puts him over the requirements for both programs.  [name] He  verbalizes that he is able to afford the  medications at this time . Encouraged to reach out with any future medication related questions or concerns.   Patient Self Care Activities:  . Calls pharmacy for medication refills . Calls provider office for new concerns or questions  Initial goal documentation]     . I want to stay healthy during this virus (pt-stated)     Current Barriers:  [name] Knowledge Deficits related to COVID-19 and impact on patient self health management  Clinical Goal(s):  [name] Over the next 30 days, patient will verbalize basic understanding of COVID-19 impact on individual health and self health management as evidenced by verbalization of basic understanding of COVID-19 as a viral disease, measures to prevent exposure, signs and symptoms, when to contact provider  Interventions: . Call to patient to f/u on recent hospitalization for salmonella/diarrhea . Patient c/o bouts of afib, patient has called cardiologist to f/u and has also started using Cpap machine more regularly . Patient stating diarrheal has improved as long as he does not drink milk . Patient stating he is being careful not to be around people in his weakened state to not catch Covid-19 . Patient stated his blood sugar with in good control 120-130's   . Patient stated the isolation has been hard for him because he is a people person, but he understands. He also reports supportive family.   Patient Self Care Activities:  . Self administers medications as prescribed . Performs ADL's independently  Please see past updates related to this goal by clicking on the "Past Updates" button in the selected goal                    . Increase water intake     Recommend drinking at least 3-4 glasses of water a day.       Fall Risk Fall Risk  08/12/2018 03/06/2018 07/27/2017 05/02/2017 12/20/2016  Falls in the past year? 0 0 No Yes Yes  Number falls in past yr: - - - 1 1  Injury with Fall? - - - Yes  Yes  Risk Factor Category  - - - - High Fall Risk  Follow up - Falls evaluation completed - Falls evaluation completed Falls evaluation completed   [place]:  Any stairs in or around the home? Yes  If so, are there any without handrails? No   Home free of loose throw rugs in walkways, pet beds, electrical cords, etc? Yes  Adequate lighting in your home to reduce risk of falls? Yes   ASSISTIVE DEVICES UTILIZED TO PREVENT FALLS:  Life alert? No  Use of a cane, walker or w/c? Yes  uses a crutch when out  Grab bars in the bathroom? Yes  Shower chair or bench in shower? Yes  Elevated toilet seat or a handicapped toilet? Yes    TIMED UP AND GO:  Unable to perform   Depression Screen PHQ 2/9 Scores 08/12/2018 07/27/2017 05/02/2017 12/20/2016  PHQ - 2 Score 0 0 0 0  PHQ- 9 Score - - 0 -    Cognitive Function     6CIT Screen 08/12/2018 07/27/2017 07/26/2016  What Year? 0 points 0 points 0 points  What month? 0 points 0 points 0 points  What time? 0 points 0 points 0 points  Count back from 20 0 points 0 points 0 points  Months in reverse 0 points 0 points 0 points  Repeat phrase 0 points 0 points 0 points  Total Score 0 0  0    Immunization History  Administered Date(s) Administered  . Influenza, High Dose Seasonal PF 11/16/2015, 12/05/2016  . Influenza,inj,Quad PF,6+ Mos 12/01/2014  . Influenza-Unspecified 11/05/2017  . Pneumococcal Conjugate-13 09/03/2013  . Pneumococcal Polysaccharide-23 06/02/2015  . Td 06/02/2015  . Zoster 02/14/2011  . Zoster Recombinat (Shingrix) 08/28/2017, 12/06/2017, 01/01/2018    Qualifies for Shingles Vaccine? Completed shingrix series  Tdap: up to date   Flu Vaccine: up to date   Pneumococcal Vaccine: up to date   Screening Tests Health Maintenance  Topic Date Due  . FOOT EXAM  06/19/2017  . INFLUENZA VACCINE  09/14/2018  . HEMOGLOBIN A1C  01/18/2019  . OPHTHALMOLOGY EXAM  04/24/2019  . TETANUS/TDAP   06/01/2025  . PNA vac Low Risk Adult  Completed   Cancer Screenings:  Colorectal Screening: Completed 10/05/2015. No longer required   Lung Cancer Screening: (Low Dose CT Chest recommended if Age 58-80 years, 30 pack-year currently smoking OR have quit w/in 15years.) does not qualify.    Additional Screening:  Hepatitis C Screening: does not qualify  Dental Screening: Recommended annual dental exams for proper oral hygiene  Community Resource Referral:  CRR required this visit?  No        Plan:  I have personally reviewed and addressed the Medicare Annual Wellness questionnaire and have noted the following in the patient's chart:  A. Medical and social history B. Use of alcohol, tobacco or illicit drugs  C. Current medications and supplements D. Functional ability and status E.  Nutritional status F.  Physical activity G. Advance directives H. List of other physicians I.  Hospitalizations, surgeries, and ER visits in previous 12 months J.  [place] such as hearing and vision if needed, cognitive and depression L. Referrals and appointments   In addition, I have reviewed and discussed with patient certain preventive protocols, quality metrics, and best practice recommendations. A written personalized care plan for preventive services as well as general preventive health recommendations were provided to patient.   Signed,   Bevelyn Ngo, LPN  05/06/5571 Nurse Health Advisor  Nurse Notes: none

## 2018-08-19 ENCOUNTER — Telehealth: Payer: Self-pay | Admitting: Family Medicine

## 2018-08-19 NOTE — Telephone Encounter (Signed)
Pt called and stated that medication metFORMIN (GLUCOPHAGE-XR) 500 MG 24 hr tablet [[id number]]  Is recalled and would like to know what he should do. Please advise.

## 2018-08-20 DIAGNOSIS — G4733 Obstructive sleep apnea (adult) (pediatric): Secondary | ICD-10-CM | POA: Diagnosis not present

## 2018-08-20 DIAGNOSIS — I10 Essential (primary) hypertension: Secondary | ICD-10-CM | POA: Diagnosis not present

## 2018-08-20 DIAGNOSIS — E782 Mixed hyperlipidemia: Secondary | ICD-10-CM | POA: Diagnosis not present

## 2018-08-20 DIAGNOSIS — I480 Paroxysmal atrial fibrillation: Secondary | ICD-10-CM | POA: Diagnosis not present

## 2018-08-20 NOTE — Telephone Encounter (Signed)
The pharmacy should give him a batch that is not recalled- not all the metformin is recalled. Please have him check with his pharmacist as to whether his batch is recalled.

## 2018-08-20 NOTE — Telephone Encounter (Signed)
Attempted to reach pt. VM box was full and I was unable to leave VM.

## 2018-08-20 NOTE — Telephone Encounter (Signed)
Pt called back and I let him know he should call pharmacy.

## 2018-08-26 ENCOUNTER — Other Ambulatory Visit: Payer: Self-pay

## 2018-08-26 ENCOUNTER — Other Ambulatory Visit
Admission: RE | Admit: 2018-08-26 | Discharge: 2018-08-26 | Disposition: A | Discharge status: Home / Self Care | Payer: Medicare Other | Source: Ambulatory Visit | Attending: Internal Medicine | Admitting: Internal Medicine

## 2018-08-26 DIAGNOSIS — Z1159 Encounter for screening for other viral diseases: Secondary | ICD-10-CM | POA: Diagnosis not present

## 2018-08-27 LAB — SARS CORONAVIRUS 2 (TAT 6-24 HRS): SARS Coronavirus 2: NEGATIVE

## 2018-08-28 ENCOUNTER — Telehealth: Payer: Self-pay

## 2018-08-29 ENCOUNTER — Ambulatory Visit
Admission: RE | Admit: 2018-08-29 | Discharge: 2018-08-29 | Disposition: A | Discharge status: Home / Self Care | Payer: Medicare Other | Attending: Internal Medicine | Admitting: Internal Medicine

## 2018-08-29 ENCOUNTER — Ambulatory Visit: Payer: Medicare Other | Admitting: Anesthesiology

## 2018-08-29 ENCOUNTER — Encounter
Admission: RE | Disposition: A | Discharge status: Home / Self Care | Payer: Self-pay | Source: Home / Self Care | Attending: Internal Medicine

## 2018-08-29 ENCOUNTER — Other Ambulatory Visit: Payer: Self-pay

## 2018-08-29 DIAGNOSIS — I10 Essential (primary) hypertension: Secondary | ICD-10-CM | POA: Diagnosis not present

## 2018-08-29 DIAGNOSIS — I480 Paroxysmal atrial fibrillation: Secondary | ICD-10-CM | POA: Insufficient documentation

## 2018-08-29 DIAGNOSIS — N183 Chronic kidney disease, stage 3 (moderate): Secondary | ICD-10-CM | POA: Diagnosis not present

## 2018-08-29 DIAGNOSIS — E782 Mixed hyperlipidemia: Secondary | ICD-10-CM | POA: Insufficient documentation

## 2018-08-29 DIAGNOSIS — E1122 Type 2 diabetes mellitus with diabetic chronic kidney disease: Secondary | ICD-10-CM | POA: Diagnosis not present

## 2018-08-29 DIAGNOSIS — Z7984 Long term (current) use of oral hypoglycemic drugs: Secondary | ICD-10-CM | POA: Insufficient documentation

## 2018-08-29 DIAGNOSIS — G4733 Obstructive sleep apnea (adult) (pediatric): Secondary | ICD-10-CM | POA: Diagnosis not present

## 2018-08-29 DIAGNOSIS — Z79899 Other long term (current) drug therapy: Secondary | ICD-10-CM | POA: Diagnosis not present

## 2018-08-29 DIAGNOSIS — I4891 Unspecified atrial fibrillation: Secondary | ICD-10-CM | POA: Diagnosis not present

## 2018-08-29 DIAGNOSIS — Z7901 Long term (current) use of anticoagulants: Secondary | ICD-10-CM | POA: Diagnosis not present

## 2018-08-29 DIAGNOSIS — E119 Type 2 diabetes mellitus without complications: Secondary | ICD-10-CM | POA: Insufficient documentation

## 2018-08-29 DIAGNOSIS — K219 Gastro-esophageal reflux disease without esophagitis: Secondary | ICD-10-CM | POA: Diagnosis not present

## 2018-08-29 DIAGNOSIS — Z96642 Presence of left artificial hip joint: Secondary | ICD-10-CM | POA: Insufficient documentation

## 2018-08-29 DIAGNOSIS — I129 Hypertensive chronic kidney disease with stage 1 through stage 4 chronic kidney disease, or unspecified chronic kidney disease: Secondary | ICD-10-CM | POA: Diagnosis not present

## 2018-08-29 HISTORY — PX: CARDIOVERSION: SHX1299

## 2018-08-29 LAB — GLUCOSE, CAPILLARY: Glucose-Capillary: 118 mg/dL — ABNORMAL HIGH (ref 70–99)

## 2018-08-29 SURGERY — CARDIOVERSION
Anesthesia: General

## 2018-08-29 MED ORDER — PROPOFOL 10 MG/ML IV BOLUS
INTRAVENOUS | Status: DC | PRN
  Administered 2018-08-29: 20 mg via INTRAVENOUS
  Administered 2018-08-29: 50 mg via INTRAVENOUS

## 2018-08-29 MED ORDER — FENTANYL CITRATE (PF) 100 MCG/2ML IJ SOLN
INTRAMUSCULAR | Status: AC
  Filled 2018-08-29: qty 2

## 2018-08-29 MED ORDER — SODIUM CHLORIDE 0.9 % IV SOLN
INTRAVENOUS | Status: DC | PRN
  Administered 2018-08-29: 08:00:00 via INTRAVENOUS

## 2018-08-29 MED ORDER — SUCCINYLCHOLINE CHLORIDE 20 MG/ML IJ SOLN
INTRAMUSCULAR | Status: AC
  Filled 2018-08-29: qty 1

## 2018-08-29 MED ORDER — PROPOFOL 10 MG/ML IV BOLUS
INTRAVENOUS | Status: AC
  Filled 2018-08-29: qty 20

## 2018-08-29 NOTE — Anesthesia Preprocedure Evaluation (Signed)
Anesthesia Evaluation  Patient identified by MRN, date of birth, ID band Patient awake    Reviewed: Allergy & Precautions, H&P , NPO status , Patient's Chart, lab work & pertinent test results  History of Anesthesia Complications (+) PROLONGED EMERGENCE and history of anesthetic complications  Airway Mallampati: II  TM Distance: <3 FB Neck ROM: limited    Dental  (+) Chipped, Dental Advidsory Given, Poor Dentition   Pulmonary neg shortness of breath, sleep apnea ,           Cardiovascular Exercise Tolerance: Good hypertension, (-) angina+ dysrhythmias Atrial Fibrillation      Neuro/Psych PSYCHIATRIC DISORDERS Anxiety Depression negative neurological ROS     GI/Hepatic Neg liver ROS, GERD  Medicated and Controlled,  Endo/Other  diabetes, Type 2  Renal/GU Renal disease  negative genitourinary   Musculoskeletal   Abdominal   Peds  Hematology  (+) Blood dyscrasia, anemia ,   Anesthesia Other Findings Past Medical History: No date: Anemia No date: Anxiety No date: Arthritis No date: Atrial fibrillation ([place]) No date: Complication of anesthesia     Comment:  pt reports low BP's after surgery at [facility] and               difficulty awakening No date: Depression No date: Diabetes ([place]) No date: Dysrhythmia No date: GERD (gastroesophageal reflux disease)     Comment:  OCC TAKES ALKA SELTZER No date: Hyperlipidemia No date: Hypertension No date: Nocturia No date: S/P ablation of atrial fibrillation     Comment:  Ablative therapy No date: Sleep apnea     Comment:  CPAP  No date: Tachycardia, unspecified  Past Surgical History: No date: ABLATION 06/27/2017: ANTERIOR LAT LUMBAR FUSION; N/A     Comment:  Procedure: Anterior Lateral Lumbar Interbody  Fusion -               Lumbar Two-Lumbar Three - Lumbar Three-Lumbar Four,               Posterior Lumbar Interbody Fusion Lumbar Four- Five;    Surgeon: Kary Kos, MD;  Location: [place];  Service:               Neurosurgery;  Laterality: N/A;  Anterior Lateral Lumbar               Interbody  Fusion - Lumbar Two-Lumbar Three - Lumbar               Three-Lumbar Four, Posterior Lumbar Interbody Fusion               Lumbar Four- Five No date: BACK SURGERY 10/05/2015: COLONOSCOPY WITH PROPOFOL; N/A     Comment:  Procedure: COLONOSCOPY WITH PROPOFOL;  Surgeon: Lollie Sails, MD;  Location: [facility] ENDOSCOPY;  Service:               Endoscopy;  Laterality: N/A; No date: HERNIA REPAIR No date: JOINT REPLACEMENT; Bilateral     Comment:  hips  RT+  LEFT X2  09/13/2016: LUMBAR LAMINECTOMY/DECOMPRESSION MICRODISCECTOMY; Left     Comment:  Procedure: Microdiscectomy - Lumbar two-three,  Lumbar               three- - left;  Surgeon: Kary Kos, MD;  Location: [place];  Service: Neurosurgery;  Laterality: Left; No date: TONSILLECTOMY  BMI  Body Mass Index:  25.60 kg/m      Reproductive/Obstetrics negative OB ROS                             Anesthesia Physical  Anesthesia Plan  ASA: III  Anesthesia Plan: General   Post-op Pain Management:    Induction: Intravenous  PONV Risk Score and Plan: 2 and Propofol infusion and TIVA  Airway Management Planned: Natural Airway and Nasal Cannula  Additional Equipment:   Intra-op Plan:   Post-operative Plan:   Informed Consent: I have reviewed the patients History and Physical, chart, labs and discussed the procedure including the risks, benefits and alternatives for the proposed anesthesia with the patient or authorized representative who has indicated his/her understanding and acceptance.     Dental Advisory Given  Plan Discussed with: Anesthesiologist, CRNA and Surgeon  Anesthesia Plan Comments: (Patient consented for risks of anesthesia including but not limited to:  - adverse reactions to medications - risk of intubation if  required - damage to teeth, lips or other oral mucosa - sore throat or hoarseness - Damage to heart, brain, lungs or loss of life  Patient voiced understanding.)        Anesthesia Quick Evaluation

## 2018-08-29 NOTE — CV Procedure (Signed)
Electrical Cardioversion Procedure Note Sean Reyes [id number] 1940/05/30  Procedure: Electrical Cardioversion Indications:  Paroxysmal non valvular atrial fibrillation  Procedure Details Consent: Risks of procedure as well as the alternatives and risks of each were explained to the (patient/caregiver).  Consent for procedure obtained. Time Out: Verified patient identification, verified procedure, site/side was marked, verified correct patient position, special equipment/implants available, medications/allergies/relevent history reviewed, required imaging and test results available.  Performed  Patient placed on cardiac monitor, pulse oximetry, supplemental oxygen as necessary.  Sedation given: Propofol and versed as per anesthesia  Pacer pads placed anterior and posterior chest.  Cardioverted 1 time(s).  Cardioverted at 120J.  Evaluation Findings: Post procedure EKG shows: NSR Complications: None Patient did tolerate procedure well.   Sean Reyes M.D. [facility] 08/29/2018, 7:46 AM

## 2018-08-29 NOTE — Anesthesia Post-op Follow-up Note (Signed)
Anesthesia QCDR form completed.        

## 2018-08-29 NOTE — Transfer of Care (Signed)
Immediate Anesthesia Transfer of Care Note  Patient: Sean Reyes.  Procedure(s) Performed: CARDIOVERSION (N/A )  Patient Location: PACU  Anesthesia Type:General  Level of Consciousness: sedated  Airway & Oxygen Therapy: Patient Spontanous Breathing and Patient connected to nasal cannula oxygen  Post-op Assessment: Report given to RN and Post -op Vital signs reviewed and stable  Post vital signs: Reviewed and stable  Last Vitals:  Vitals Value Taken Time  BP 130/85 08/29/18 0745  Temp    Pulse 63 08/29/18 0747  Resp 16 08/29/18 0747  SpO2 95 % 08/29/18 0747  Vitals shown include unvalidated device data.  Last Pain:  Vitals:   08/29/18 0720  TempSrc: Oral         Complications: No apparent anesthesia complications

## 2018-08-29 NOTE — Anesthesia Postprocedure Evaluation (Signed)
Anesthesia Post Note  Patient: Sean Reyes.  Procedure(s) Performed: CARDIOVERSION (N/A )  Patient location during evaluation: Specials Recovery Anesthesia Type: General Level of consciousness: awake and alert Pain management: pain level controlled Vital Signs Assessment: post-procedure vital signs reviewed and stable Respiratory status: spontaneous breathing, nonlabored ventilation, respiratory function stable and patient connected to nasal cannula oxygen Cardiovascular status: blood pressure returned to baseline and stable Postop Assessment: no apparent nausea or vomiting Anesthetic complications: no     Last Vitals:  Vitals:   08/29/18 0815 08/29/18 0823  BP:  125/84  Pulse: 66 66  Resp: 17   Temp:    SpO2: 97% 98%    Last Pain:  Vitals:   08/29/18 0720  TempSrc: Oral                 Martha Clan

## 2018-08-30 ENCOUNTER — Encounter: Payer: Self-pay | Admitting: Internal Medicine

## 2018-09-03 ENCOUNTER — Encounter: Payer: Self-pay | Admitting: Family Medicine

## 2018-09-09 DIAGNOSIS — G4733 Obstructive sleep apnea (adult) (pediatric): Secondary | ICD-10-CM | POA: Diagnosis not present

## 2018-09-09 DIAGNOSIS — I471 Supraventricular tachycardia: Secondary | ICD-10-CM | POA: Diagnosis not present

## 2018-09-09 DIAGNOSIS — I4891 Unspecified atrial fibrillation: Secondary | ICD-10-CM | POA: Diagnosis not present

## 2018-09-09 DIAGNOSIS — I10 Essential (primary) hypertension: Secondary | ICD-10-CM | POA: Diagnosis not present

## 2018-09-17 ENCOUNTER — Telehealth: Payer: Self-pay

## 2018-09-19 DIAGNOSIS — I471 Supraventricular tachycardia: Secondary | ICD-10-CM | POA: Diagnosis not present

## 2018-09-19 DIAGNOSIS — I480 Paroxysmal atrial fibrillation: Secondary | ICD-10-CM | POA: Diagnosis not present

## 2018-09-20 ENCOUNTER — Other Ambulatory Visit
Admission: RE | Admit: 2018-09-20 | Discharge: 2018-09-20 | Disposition: A | Discharge status: Home / Self Care | Payer: Medicare Other | Source: Ambulatory Visit | Attending: Internal Medicine | Admitting: Internal Medicine

## 2018-09-20 ENCOUNTER — Other Ambulatory Visit: Payer: Self-pay

## 2018-09-20 DIAGNOSIS — Z01812 Encounter for preprocedural laboratory examination: Secondary | ICD-10-CM | POA: Diagnosis not present

## 2018-09-20 DIAGNOSIS — Z20828 Contact with and (suspected) exposure to other viral communicable diseases: Secondary | ICD-10-CM | POA: Diagnosis not present

## 2018-09-21 LAB — SARS CORONAVIRUS 2 (TAT 6-24 HRS): SARS Coronavirus 2: NEGATIVE

## 2018-09-24 ENCOUNTER — Ambulatory Visit: Payer: Medicare Other | Admitting: Registered Nurse

## 2018-09-24 ENCOUNTER — Ambulatory Visit
Admission: RE | Admit: 2018-09-24 | Discharge: 2018-09-24 | Disposition: A | Discharge status: Home / Self Care | Payer: Medicare Other | Attending: Internal Medicine | Admitting: Internal Medicine

## 2018-09-24 ENCOUNTER — Other Ambulatory Visit: Payer: Self-pay

## 2018-09-24 ENCOUNTER — Encounter
Admission: RE | Disposition: A | Discharge status: Home / Self Care | Payer: Self-pay | Source: Home / Self Care | Attending: Internal Medicine

## 2018-09-24 DIAGNOSIS — I471 Supraventricular tachycardia: Secondary | ICD-10-CM | POA: Diagnosis not present

## 2018-09-24 DIAGNOSIS — G4733 Obstructive sleep apnea (adult) (pediatric): Secondary | ICD-10-CM | POA: Diagnosis not present

## 2018-09-24 DIAGNOSIS — E785 Hyperlipidemia, unspecified: Secondary | ICD-10-CM | POA: Insufficient documentation

## 2018-09-24 DIAGNOSIS — E118 Type 2 diabetes mellitus with unspecified complications: Secondary | ICD-10-CM | POA: Insufficient documentation

## 2018-09-24 DIAGNOSIS — E782 Mixed hyperlipidemia: Secondary | ICD-10-CM | POA: Diagnosis not present

## 2018-09-24 DIAGNOSIS — I10 Essential (primary) hypertension: Secondary | ICD-10-CM | POA: Insufficient documentation

## 2018-09-24 DIAGNOSIS — Z96642 Presence of left artificial hip joint: Secondary | ICD-10-CM | POA: Diagnosis not present

## 2018-09-24 DIAGNOSIS — G4730 Sleep apnea, unspecified: Secondary | ICD-10-CM | POA: Diagnosis not present

## 2018-09-24 DIAGNOSIS — E1140 Type 2 diabetes mellitus with diabetic neuropathy, unspecified: Secondary | ICD-10-CM | POA: Diagnosis not present

## 2018-09-24 DIAGNOSIS — I4510 Unspecified right bundle-branch block: Secondary | ICD-10-CM | POA: Diagnosis not present

## 2018-09-24 DIAGNOSIS — Z86010 Personal history of colonic polyps: Secondary | ICD-10-CM | POA: Diagnosis not present

## 2018-09-24 DIAGNOSIS — I480 Paroxysmal atrial fibrillation: Secondary | ICD-10-CM | POA: Insufficient documentation

## 2018-09-24 DIAGNOSIS — Z7901 Long term (current) use of anticoagulants: Secondary | ICD-10-CM | POA: Insufficient documentation

## 2018-09-24 DIAGNOSIS — Z79899 Other long term (current) drug therapy: Secondary | ICD-10-CM | POA: Insufficient documentation

## 2018-09-24 DIAGNOSIS — Z7984 Long term (current) use of oral hypoglycemic drugs: Secondary | ICD-10-CM | POA: Insufficient documentation

## 2018-09-24 DIAGNOSIS — K219 Gastro-esophageal reflux disease without esophagitis: Secondary | ICD-10-CM | POA: Diagnosis not present

## 2018-09-24 DIAGNOSIS — I4891 Unspecified atrial fibrillation: Secondary | ICD-10-CM | POA: Diagnosis not present

## 2018-09-24 HISTORY — PX: CARDIOVERSION: SHX1299

## 2018-09-24 LAB — GLUCOSE, CAPILLARY: Glucose-Capillary: 135 mg/dL — ABNORMAL HIGH (ref 70–99)

## 2018-09-24 SURGERY — CARDIOVERSION
Anesthesia: General

## 2018-09-24 MED ORDER — PROPOFOL 500 MG/50ML IV EMUL
INTRAVENOUS | Status: DC | PRN
  Administered 2018-09-24: 60 mg via INTRAVENOUS

## 2018-09-24 MED ORDER — SODIUM CHLORIDE 0.9 % IV SOLN: INTRAVENOUS | Status: DC

## 2018-09-24 MED ORDER — PROPOFOL 10 MG/ML IV BOLUS
INTRAVENOUS | Status: AC
  Filled 2018-09-24: qty 20

## 2018-09-24 MED ORDER — SODIUM CHLORIDE 0.9 % IV SOLN
INTRAVENOUS | Status: DC | PRN
  Administered 2018-09-24: 08:00:00 via INTRAVENOUS

## 2018-09-24 NOTE — Anesthesia Preprocedure Evaluation (Signed)
Anesthesia Evaluation  Patient identified by MRN, date of birth, ID band Patient awake    Reviewed: Allergy & Precautions, NPO status , Patient's Chart, lab work & pertinent test results, reviewed documented beta blocker date and time   History of Anesthesia Complications (+) history of anesthetic complications (bp drop in remote past, no problems since)  Airway Mallampati: II       Dental   Pulmonary sleep apnea and Continuous Positive Airway Pressure Ventilation , neg COPD, Not current smoker,           Cardiovascular hypertension, Pt. on medications and Pt. on home beta blockers (-) Past MI and (-) CHF + dysrhythmias Atrial Fibrillation (-) Valvular Problems/Murmurs     Neuro/Psych neg Seizures Anxiety Depression    GI/Hepatic Neg liver ROS, GERD  Medicated and Controlled,  Endo/Other  diabetes, Type 2, Oral Hypoglycemic Agents  Renal/GU Renal InsufficiencyRenal disease     Musculoskeletal   Abdominal   Peds  Hematology   Anesthesia Other Findings   Reproductive/Obstetrics                             Anesthesia Physical Anesthesia Plan  ASA: III  Anesthesia Plan: General   Post-op Pain Management:    Induction: Intravenous  PONV Risk Score and Plan: 2 and Propofol infusion and TIVA  Airway Management Planned: Nasal Cannula  Additional Equipment:   Intra-op Plan:   Post-operative Plan:   Informed Consent: I have reviewed the patients History and Physical, chart, labs and discussed the procedure including the risks, benefits and alternatives for the proposed anesthesia with the patient or authorized representative who has indicated his/her understanding and acceptance.       Plan Discussed with:   Anesthesia Plan Comments:         Anesthesia Quick Evaluation

## 2018-09-24 NOTE — Anesthesia Post-op Follow-up Note (Signed)
Anesthesia QCDR form completed.        

## 2018-09-24 NOTE — Discharge Instructions (Signed)
Electrical Cardioversion, Care After This sheet gives you information about how to care for yourself after your procedure. Your health care provider may also give you more specific instructions. If you have problems or questions, contact your health care provider. What can I expect after the procedure? After the procedure, it is common to have:  Some redness on the skin where the shocks were given. Follow these instructions at home:   Do not drive for 24 hours if you were given a medicine to help you relax (sedative).  Take over-the-counter and prescription medicines only as told by your health care provider.  Ask your health care provider how to check your pulse. Check it often.  Rest for 48 hours after the procedure or as told by your health care provider.  Avoid or limit your caffeine use as told by your health care provider. Contact a health care provider if:  You feel like your heart is beating too quickly or your pulse is not regular.  You have a serious muscle cramp that does not go away. Get help right away if:   You have discomfort in your chest.  You are dizzy or you feel faint.  You have trouble breathing or you are short of breath.  Your speech is slurred.  You have trouble moving an arm or leg on one side of your body.  Your fingers or toes turn cold or blue. This information is not intended to replace advice given to you by your health care provider. Make sure you discuss any questions you have with your health care provider. Document Released: 11/20/2012 Document Revised: 01/12/2017 Document Reviewed: 08/06/2015 Elsevier Patient Education  2020 Elsevier Inc[employer]    Moderate Conscious Sedation, Adult, Care After These instructions provide you with information about caring for yourself after your procedure. Your health care provider may also give you more specific instructions. Your treatment has been planned according to current medical practices, but problems  sometimes occur. Call your health care provider if you have any problems or questions after your procedure. What can I expect after the procedure? After your procedure, it is common:  To feel sleepy for several hours.  To feel clumsy and have poor balance for several hours.  To have poor judgment for several hours.  To vomit if you eat too soon. Follow these instructions at home: For at least 24 hours after the procedure:   Do not: ? Participate in activities where you could fall or become injured. ? Drive. ? Use heavy machinery. ? Drink alcohol. ? Take sleeping pills or medicines that cause drowsiness. ? Make important decisions or sign legal documents. ? Take care of children on your own.  Rest. Eating and drinking  Follow the diet recommended by your health care provider.  If you vomit: ? Drink water, juice, or soup when you can drink without vomiting. ? Make sure you have little or no nausea before eating solid foods. General instructions  Have a responsible adult stay with you until you are awake and alert.  Take over-the-counter and prescription medicines only as told by your health care provider.  If you smoke, do not smoke without supervision.  Keep all follow-up visits as told by your health care provider. This is important. Contact a health care provider if:  You keep feeling nauseous or you keep vomiting.  You feel light-headed.  You develop a rash.  You have a fever. Get help right away if:  You have trouble breathing. This information is   not intended to replace advice given to you by your health care provider. Make sure you discuss any questions you have with your health care provider. Document Released: 11/20/2012 Document Revised: 01/12/2017 Document Reviewed: 05/22/2015 Elsevier Patient Education  2020 Elsevier Inc.  

## 2018-09-24 NOTE — Transfer of Care (Signed)
Immediate Anesthesia Transfer of Care Note  Patient: Sean Reyes.  Procedure(s) Performed: CARDIOVERSION (N/A )  Patient Location: PACU  Anesthesia Type:General  Level of Consciousness: sedated  Airway & Oxygen Therapy: Patient Spontanous Breathing and Patient connected to nasal cannula oxygen  Post-op Assessment: Report given to RN and Post -op Vital signs reviewed and stable  Post vital signs: Reviewed and stable  Last Vitals:  Vitals Value Taken Time  BP 144/87 09/24/18 0749  Temp    Pulse 74 09/24/18 0749  Resp 15 09/24/18 0749  SpO2 93 % 09/24/18 0749  Vitals shown include unvalidated device data.  Last Pain:  Vitals:   09/24/18 0700  TempSrc: Oral  PainSc: 0-No pain         Complications: No apparent anesthesia complications

## 2018-09-24 NOTE — Anesthesia Postprocedure Evaluation (Signed)
Anesthesia Post Note  Patient: Sean Reyes.  Procedure(s) Performed: CARDIOVERSION (N/A )  Patient location during evaluation: Specials Recovery Anesthesia Type: General Level of consciousness: sedated Pain management: pain level controlled Vital Signs Assessment: post-procedure vital signs reviewed and stable Respiratory status: spontaneous breathing and respiratory function stable Cardiovascular status: stable Anesthetic complications: no     Last Vitals:  Vitals:   09/24/18 0748 09/24/18 0749  BP:  (!) 144/87  Pulse: 70 74  Resp: 17 15  Temp:    SpO2: 100% 94%    Last Pain:  Vitals:   09/24/18 0700  TempSrc: Oral  PainSc: 0-No pain                 Sibyl Mikula K

## 2018-09-24 NOTE — CV Procedure (Signed)
Electrical Cardioversion Procedure Note Sean Reyes [id number] [birth year]  Procedure: Electrical Cardioversion Indications:  atrial tachycardia with rapid rate  Procedure Details Consent: Risks of procedure as well as the alternatives and risks of each were explained to the (patient/caregiver).  Consent for procedure obtained. Time Out: Verified patient identification, verified procedure, site/side was marked, verified correct patient position, special equipment/implants available, medications/allergies/relevent history reviewed, required imaging and test results available.  Performed  Patient placed on cardiac monitor, pulse oximetry, supplemental oxygen as necessary.  Sedation given: Propofol and versed as per anesthesia  Pacer pads placed anterior and posterior chest.  Cardioverted 1 time(s).  Cardioverted at 120J.  Evaluation Findings: Post procedure EKG shows: NSR Complications: None Patient did tolerate procedure well.   Sean Reyes M.D. [facility] 09/24/2018, 7:53 AM

## 2018-10-02 ENCOUNTER — Telehealth: Payer: Self-pay

## 2018-10-07 DIAGNOSIS — I480 Paroxysmal atrial fibrillation: Secondary | ICD-10-CM | POA: Diagnosis not present

## 2018-10-11 ENCOUNTER — Telehealth: Payer: Self-pay

## 2018-10-16 ENCOUNTER — Encounter: Payer: Self-pay | Admitting: Family Medicine

## 2018-10-16 ENCOUNTER — Other Ambulatory Visit: Payer: Self-pay

## 2018-10-16 ENCOUNTER — Ambulatory Visit (INDEPENDENT_AMBULATORY_CARE_PROVIDER_SITE_OTHER): Payer: Medicare Other | Admitting: Family Medicine

## 2018-10-16 DIAGNOSIS — E1143 Type 2 diabetes mellitus with diabetic autonomic (poly)neuropathy: Secondary | ICD-10-CM

## 2018-10-16 DIAGNOSIS — I480 Paroxysmal atrial fibrillation: Secondary | ICD-10-CM | POA: Diagnosis not present

## 2018-10-16 DIAGNOSIS — I10 Essential (primary) hypertension: Secondary | ICD-10-CM | POA: Diagnosis not present

## 2018-10-16 NOTE — Assessment & Plan Note (Signed)
The current medical regimen is effective;  continue present plan and medications.  

## 2018-10-16 NOTE — Progress Notes (Signed)
   There were no vitals taken for this visit.   Subjective:    Patient ID: Sean Ballog., male    DOB: May 30, 1940, 78 y.o.   MRN: [id number]  HPI: Sean Longmore. is a 78 y.o. male  Med check Discussed with patient has several concerns getting ready to go on a low-carb diet primarily to assist his wife is been told to reduce weight in preparation for hip replacement surgery. Discussed blood pressure and diabetes concerns and adjusting medications appropriately. Good reports from cardiology and reviewed not changing Eliquis.  Relevant past medical, surgical, family and social history reviewed and updated as indicated. Interim medical history since our last visit reviewed. Allergies and medications reviewed and updated.  Review of Systems  Constitutional: Negative.   Respiratory: Negative.   Cardiovascular: Negative.     Per HPI unless specifically indicated above     Objective:    There were no vitals taken for this visit.  Wt Readings from Last 3 Encounters:  09/24/18 190 lb (86.2 kg)  08/29/18 178 lb (80.7 kg)  07/22/18 176 lb 2.4 oz (79.9 kg)    Physical Exam  Results for orders placed or performed during the hospital encounter of 09/24/18  Glucose, capillary  Result Value Ref Range   Glucose-Capillary 135 (H) 70 - 99 mg/dL      Assessment & Plan:   Problem List Items Addressed This Visit      Cardiovascular and Mediastinum   Atrial fibrillation ([place])    The current medical regimen is effective;  continue present plan and medications.       Hypertension    The current medical regimen is effective;  continue present plan and medications.         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          Telemedicine using audio/video telecommunications for a synchronous communication visit. Today's visit due to COVID-19 isolation precautions I  connected with and verified that I am speaking with the correct person using two identifiers.   I discussed the limitations, risks, security and privacy concerns of performing an evaluation and management service by telecommunication and the availability of in person appointments. I also discussed with the patient that there may be a patient responsible charge related to this service. The patient expressed understanding and agreed to proceed. The patient's location is home. I am at home.   I discussed the assessment and treatment plan with the patient. The patient was provided an opportunity to ask questions and all were answered. The patient agreed with the plan and demonstrated an understanding of the instructions.   The patient was advised to call back or seek an in-person evaluation if the symptoms worsen or if the condition fails to improve as anticipated.   I provided 21+ minutes of time during this encounter. Follow up plan: Return for Physical Exam, Hemoglobin A1c, Dec.

## 2018-10-17 DIAGNOSIS — M5416 Radiculopathy, lumbar region: Secondary | ICD-10-CM | POA: Diagnosis not present

## 2018-10-17 DIAGNOSIS — M4807 Spinal stenosis, lumbosacral region: Secondary | ICD-10-CM | POA: Diagnosis not present

## 2018-10-17 DIAGNOSIS — Z981 Arthrodesis status: Secondary | ICD-10-CM | POA: Diagnosis not present

## 2018-10-23 ENCOUNTER — Other Ambulatory Visit: Payer: Self-pay | Admitting: Neurosurgery

## 2018-10-23 DIAGNOSIS — M4807 Spinal stenosis, lumbosacral region: Secondary | ICD-10-CM

## 2018-10-23 DIAGNOSIS — Z981 Arthrodesis status: Secondary | ICD-10-CM

## 2018-10-28 DIAGNOSIS — K219 Gastro-esophageal reflux disease without esophagitis: Secondary | ICD-10-CM | POA: Diagnosis not present

## 2018-10-28 DIAGNOSIS — R1310 Dysphagia, unspecified: Secondary | ICD-10-CM | POA: Diagnosis not present

## 2018-10-30 ENCOUNTER — Telehealth: Payer: Self-pay

## 2018-10-30 ENCOUNTER — Ambulatory Visit: Payer: Self-pay | Admitting: *Deleted

## 2018-10-30 ENCOUNTER — Ambulatory Visit: Payer: Medicare Other

## 2018-10-30 ENCOUNTER — Ambulatory Visit: Admission: RE | Admit: 2018-10-30 | Payer: Medicare Other | Source: Ambulatory Visit

## 2018-10-30 DIAGNOSIS — I480 Paroxysmal atrial fibrillation: Secondary | ICD-10-CM

## 2018-10-30 NOTE — Chronic Care Management (AMB) (Signed)
  Chronic Care Management   Outreach Note  10/30/2018 Name: Sean Reyes. MRN: [id number] DOB: 1940/04/14  Referred by: [name], MD Reason for referral : Chronic Care Management (Unsuccessful outreach x1)   An unsuccessful telephone outreach was attempted today. The patient was referred to the case management team by for assistance with chronic care management and care coordination.   Follow Up Plan: A HIPPA compliant phone message was left for the patient providing contact information and requesting a return call.  The care management team will reach out to the patient again over the next 60 days.   Merlene Morse Nasteho Glantz RN, BSN Nurse Case Editor, [facility]  [phone]) Business Mobile

## 2018-10-31 NOTE — Progress Notes (Signed)
Patient was interviewed via phone call regarding reschedule of Myelogram.  Patient to arrive at 0700 on AM of procedure 11/07/18.  NPO after midnight.  Bring driver. Patient was instructed by office of ordering physician to stop Eliquis 48 hours prior to procedure and 24 hours after procedure.  Patient was also instructed by this RN to stop Metformin 48 hours prior  and 24 hours after procedure according to radiological recommendation for use of contrast dye. Patient with verbal agreement.  Instructed that he will receive a phone call day before to remind arrival time. Patient given phone number for same day surgery in case of questions prior to procedure.

## 2018-11-04 ENCOUNTER — Telehealth: Payer: Self-pay

## 2018-11-04 MED ORDER — BENAZEPRIL HCL 40 MG PO TABS: ORAL_TABLET | ORAL | 3 refills | Status: DC

## 2018-11-04 NOTE — Telephone Encounter (Signed)
Please send in 90 day supply of Benazepril

## 2018-11-06 ENCOUNTER — Ambulatory Visit: Payer: Medicare Other

## 2018-11-07 ENCOUNTER — Ambulatory Visit
Admission: RE | Admit: 2018-11-07 | Discharge: 2018-11-07 | Disposition: A | Discharge status: Home / Self Care | Payer: Medicare Other | Source: Ambulatory Visit | Attending: Neurosurgery | Admitting: Neurosurgery

## 2018-11-07 ENCOUNTER — Other Ambulatory Visit: Payer: Self-pay

## 2018-11-07 DIAGNOSIS — M545 Low back pain: Secondary | ICD-10-CM | POA: Diagnosis not present

## 2018-11-07 DIAGNOSIS — I700 Atherosclerosis of aorta: Secondary | ICD-10-CM | POA: Diagnosis not present

## 2018-11-07 DIAGNOSIS — M48061 Spinal stenosis, lumbar region without neurogenic claudication: Secondary | ICD-10-CM | POA: Diagnosis not present

## 2018-11-07 DIAGNOSIS — Z981 Arthrodesis status: Secondary | ICD-10-CM

## 2018-11-07 DIAGNOSIS — M4807 Spinal stenosis, lumbosacral region: Secondary | ICD-10-CM

## 2018-11-07 LAB — CBC WITH DIFFERENTIAL/PLATELET
Abs Immature Granulocytes: 0.01 10*3/uL (ref 0.00–0.07)
Basophils Absolute: 0 10*3/uL (ref 0.0–0.1)
Basophils Relative: 0 %
Eosinophils Absolute: 0.1 10*3/uL (ref 0.0–0.5)
Eosinophils Relative: 1 %
HCT: 34.5 % — ABNORMAL LOW (ref 39.0–52.0)
Hemoglobin: 11.5 g/dL — ABNORMAL LOW (ref 13.0–17.0)
Immature Granulocytes: 0 %
Lymphocytes Relative: 27 %
Lymphs Abs: 1.6 10*3/uL (ref 0.7–4.0)
MCH: 30.5 pg (ref 26.0–34.0)
MCHC: 33.3 g/dL (ref 30.0–36.0)
MCV: 91.5 fL (ref 80.0–100.0)
Monocytes Absolute: 0.8 10*3/uL (ref 0.1–1.0)
Monocytes Relative: 14 %
Neutro Abs: 3.4 10*3/uL (ref 1.7–7.7)
Neutrophils Relative %: 58 %
Platelets: 164 10*3/uL (ref 150–400)
RBC: 3.77 MIL/uL — ABNORMAL LOW (ref 4.22–5.81)
RDW: 14.2 % (ref 11.5–15.5)
WBC: 5.9 10*3/uL (ref 4.0–10.5)
nRBC: 0 % (ref 0.0–0.2)

## 2018-11-07 LAB — PROTIME-INR
INR: 1 (ref 0.8–1.2)
Prothrombin Time: 12.6 seconds (ref 11.4–15.2)

## 2018-11-07 LAB — APTT: aPTT: 30 seconds (ref 24–36)

## 2018-11-07 MED ORDER — IOHEXOL 180 MG/ML  SOLN
18.0000 mL | Freq: Once | INTRAMUSCULAR | Status: AC | PRN
  Administered 2018-11-07: 18 mL via INTRATHECAL

## 2018-11-07 NOTE — Progress Notes (Signed)
Notified radiology that patient's labs are back. They will call us back, need to make sure they are ready for him.

## 2018-11-11 DIAGNOSIS — E1140 Type 2 diabetes mellitus with diabetic neuropathy, unspecified: Secondary | ICD-10-CM | POA: Diagnosis not present

## 2018-11-11 DIAGNOSIS — I10 Essential (primary) hypertension: Secondary | ICD-10-CM | POA: Diagnosis not present

## 2018-11-11 DIAGNOSIS — I4891 Unspecified atrial fibrillation: Secondary | ICD-10-CM | POA: Diagnosis not present

## 2018-11-11 DIAGNOSIS — G4733 Obstructive sleep apnea (adult) (pediatric): Secondary | ICD-10-CM | POA: Diagnosis not present

## 2018-11-12 DIAGNOSIS — R29898 Other symptoms and signs involving the musculoskeletal system: Secondary | ICD-10-CM | POA: Diagnosis not present

## 2018-11-12 DIAGNOSIS — M545 Low back pain: Secondary | ICD-10-CM | POA: Diagnosis not present

## 2018-11-22 DIAGNOSIS — Z23 Encounter for immunization: Secondary | ICD-10-CM | POA: Diagnosis not present

## 2018-12-03 ENCOUNTER — Telehealth: Payer: Self-pay

## 2018-12-19 ENCOUNTER — Encounter: Payer: Self-pay | Admitting: Family Medicine

## 2018-12-19 ENCOUNTER — Other Ambulatory Visit: Payer: Self-pay

## 2018-12-19 ENCOUNTER — Ambulatory Visit (INDEPENDENT_AMBULATORY_CARE_PROVIDER_SITE_OTHER): Payer: Medicare Other | Admitting: Family Medicine

## 2018-12-19 VITALS — BP 135/79 | HR 63 | Ht 73.0 in | Wt 196.0 lb

## 2018-12-19 DIAGNOSIS — R197 Diarrhea, unspecified: Secondary | ICD-10-CM

## 2018-12-19 LAB — UA/M W/RFLX CULTURE, ROUTINE
Bilirubin, UA: NEGATIVE
Glucose, UA: NEGATIVE
Leukocytes,UA: NEGATIVE
Nitrite, UA: NEGATIVE
RBC, UA: NEGATIVE
Specific Gravity, UA: >1.03 — ABNORMAL HIGH (ref 1.005–1.030)
Urobilinogen, Ur: 1 mg/dL (ref 0.2–1.0)
pH, UA: 5 (ref 5.0–7.5)

## 2018-12-19 LAB — MICROSCOPIC EXAMINATION
Bacteria, UA: NONE SEEN
RBC, Urine: NONE SEEN /hpf (ref 0–2)
WBC, UA: NONE SEEN /hpf (ref 0–5)

## 2018-12-19 NOTE — Assessment & Plan Note (Signed)
Vitals stable, patient overall well appearing and without red flag sxs at this time. Will obtain labs and stool testing, push fluids, BRAT diet, imodium prn. Monitor closely for improvement, await results of testing.

## 2018-12-19 NOTE — Addendum Note (Signed)
Addended by: Lesle Chris on: 12/19/2018 02:27 PM   Modules accepted: Orders

## 2018-12-19 NOTE — Progress Notes (Signed)
BP 135/79   Pulse 63   Ht 6\' 1"  (1.854 m)   Wt 196 lb (88.9 kg)   BMI 25.86 kg/m    Subjective:    Patient ID: Sean Hacker., male    DOB: [birth year], 78 y.o.   MRN: [id number]  HPI: Sean Marschke. is a 78 y.o. male  Chief Complaint  Patient presents with  . Diarrhea    off and on for 4 weeks    . This visit was completed via WebEx due to the restrictions of the COVID-19 pandemic. All issues as above were discussed and addressed. Physical exam was done as above through visual confirmation on WebEx. If it was felt that the patient should be evaluated in the office, they were directed there. The patient verbally consented to this visit. . Location of the patient: home . Location of the provider: work . Those involved with this call:  . Provider: Merrie Roof, PA-C . CMA: Lesle Chris, [place] . Front Desk/Registration: Jill Side  . Time spent on call: 15 minutes with patient face to face via video conference. More than 50% of this time was spent in counseling and coordination of care. 5 minutes total spent in review of patient's record and preparation of their chart. I verified patient identity using two factors (patient name and date of birth). Patient consents verbally to being seen via telemedicine visit today.   C/o diarrhea intermittently the last 4 weeks now. The past week or so it's been significant - 4-5 times per day. No blood, mucus in stools. Minimal left sided belly pain every here and there with no notable pattern. No new foods, new medications, sick contacts, N/V, fevers, dizziness, weakness. Not trying anything for relief. Notes his entire life he's had "loose stools", but diarrhea like this is abnormal for him. Did have a bad case of salmonella 5 months ago where he had to be admitted for 5 days but seemed to have done well since getting over that. Notes this is nowhere near that severe. Unclear when last colonoscopy was, patient states "years ago".   Relevant past  medical, surgical, family and social history reviewed and updated as indicated. Interim medical history since our last visit reviewed. Allergies and medications reviewed and updated.  Review of Systems  Per HPI unless specifically indicated above     Objective:    BP 135/79   Pulse 63   Ht 6\' 1"  (1.854 m)   Wt 196 lb (88.9 kg)   BMI 25.86 kg/m   Wt Readings from Last 3 Encounters:  12/19/18 196 lb (88.9 kg)  09/24/18 190 lb (86.2 kg)  08/29/18 178 lb (80.7 kg)    Physical Exam Vitals signs and nursing note reviewed.  Constitutional:      General: He is not in acute distress.    Appearance: Normal appearance.  HENT:     Head: Atraumatic.     Right Ear: External ear normal.     Left Ear: External ear normal.     Nose: Nose normal. No congestion.     Mouth/Throat:     Mouth: Mucous membranes are moist.     Pharynx: Oropharynx is clear. No posterior oropharyngeal erythema.  Eyes:     Extraocular Movements: Extraocular movements intact.     Conjunctiva/sclera: Conjunctivae normal.  Neck:     Musculoskeletal: Normal range of motion.  Cardiovascular:     Comments: Unable to assess via virtual visit Pulmonary:     Effort: Pulmonary  effort is normal. No respiratory distress.  Abdominal:     Comments: nontender per patient's self palpation  Musculoskeletal: Normal range of motion.  Skin:    General: Skin is dry.     Findings: No erythema.  Neurological:     Mental Status: He is alert and oriented to person, place, and time.  Psychiatric:        Mood and Affect: Mood normal.        Thought Content: Thought content normal.        Judgment: Judgment normal.     Results for orders placed or performed during the hospital encounter of 11/07/18  APTT  Result Value Ref Range   aPTT 30 24 - 36 seconds  CBC with Differential/Platelet  Result Value Ref Range   WBC 5.9 4.0 - 10.5 K/uL   RBC 3.77 (L) 4.22 - 5.81 MIL/uL   Hemoglobin 11.5 (L) 13.0 - 17.0 g/dL   HCT 34.5 (L)  39.0 - 52.0 %   MCV 91.5 80.0 - 100.0 fL   MCH 30.5 26.0 - 34.0 pg   MCHC 33.3 30.0 - 36.0 g/dL   RDW 14.2 11.5 - 15.5 %   Platelets 164 150 - 400 K/uL   nRBC 0.0 0.0 - 0.2 %   Neutrophils Relative % 58 %   Neutro Abs 3.4 1.7 - 7.7 K/uL   Lymphocytes Relative 27 %   Lymphs Abs 1.6 0.7 - 4.0 K/uL   Monocytes Relative 14 %   Monocytes Absolute 0.8 0.1 - 1.0 K/uL   Eosinophils Relative 1 %   Eosinophils Absolute 0.1 0.0 - 0.5 K/uL   Basophils Relative 0 %   Basophils Absolute 0.0 0.0 - 0.1 K/uL   Immature Granulocytes 0 %   Abs Immature Granulocytes 0.01 0.00 - 0.07 K/uL  Protime-INR  Result Value Ref Range   Prothrombin Time 12.6 11.4 - 15.2 seconds   INR 1.0 0.8 - 1.2      Assessment & Plan:   Problem List Items Addressed This Visit      Other   Diarrhea - Primary    Vitals stable, patient overall well appearing and without red flag sxs at this time. Will obtain labs and stool testing, push fluids, BRAT diet, imodium prn. Monitor closely for improvement, await results of testing.       Relevant Orders   CBC with Differential/Platelet out   Comprehensive metabolic panel   TSH   UA/M w/rflx Culture, Routine   Lipase   Stool C-Diff Toxin Assay   Ova and parasite examination   Fecal leukocytes       Follow up plan: Return if symptoms worsen or fail to improve.

## 2018-12-20 LAB — COMPREHENSIVE METABOLIC PANEL
ALT: 10 IU/L (ref 0–44)
AST: 15 IU/L (ref 0–40)
Albumin/Globulin Ratio: 1.6 (ref 1.2–2.2)
Albumin: 4.2 g/dL (ref 3.7–4.7)
Alkaline Phosphatase: 85 IU/L (ref 39–117)
BUN/Creatinine Ratio: 18 (ref 10–24)
BUN: 24 mg/dL (ref 8–27)
Bilirubin Total: 0.4 mg/dL (ref 0.0–1.2)
CO2: 24 mmol/L (ref 20–29)
Calcium: 10 mg/dL (ref 8.6–10.2)
Chloride: 101 mmol/L (ref 96–106)
Creatinine, Ser: 1.31 mg/dL — ABNORMAL HIGH (ref 0.76–1.27)
GFR calc Af Amer: 61 mL/min/{1.73_m2} (ref >59)
GFR calc non Af Amer: 53 mL/min/{1.73_m2} — ABNORMAL LOW (ref >59)
Globulin, Total: 2.7 g/dL (ref 1.5–4.5)
Glucose: 146 mg/dL — ABNORMAL HIGH (ref 65–99)
Potassium: 4.5 mmol/L (ref 3.5–5.2)
Sodium: 138 mmol/L (ref 134–144)
Total Protein: 6.9 g/dL (ref 6.0–8.5)

## 2018-12-20 LAB — CBC WITH DIFFERENTIAL/PLATELET
Basophils Absolute: 0 10*3/uL (ref 0.0–0.2)
Basos: 0 %
EOS (ABSOLUTE): 0.1 10*3/uL (ref 0.0–0.4)
Eos: 1 %
Hematocrit: 33.8 % — ABNORMAL LOW (ref 37.5–51.0)
Hemoglobin: 11.6 g/dL — ABNORMAL LOW (ref 13.0–17.7)
Immature Grans (Abs): 0 10*3/uL (ref 0.0–0.1)
Immature Granulocytes: 0 %
Lymphocytes Absolute: 2.1 10*3/uL (ref 0.7–3.1)
Lymphs: 34 %
MCH: 31.2 pg (ref 26.6–33.0)
MCHC: 34.3 g/dL (ref 31.5–35.7)
MCV: 91 fL (ref 79–97)
Monocytes Absolute: 0.7 10*3/uL (ref 0.1–0.9)
Monocytes: 12 %
Neutrophils Absolute: 3.2 10*3/uL (ref 1.4–7.0)
Neutrophils: 53 %
Platelets: 179 10*3/uL (ref 150–450)
RBC: 3.72 x10E6/uL — ABNORMAL LOW (ref 4.14–5.80)
RDW: 14.2 % (ref 11.6–15.4)
WBC: 6.1 10*3/uL (ref 3.4–10.8)

## 2018-12-20 LAB — LIPASE: Lipase: 25 U/L (ref 13–78)

## 2018-12-20 LAB — TSH: TSH: 3.32 u[IU]/mL (ref 0.450–4.500)

## 2018-12-23 ENCOUNTER — Other Ambulatory Visit: Payer: Medicare Other

## 2018-12-23 ENCOUNTER — Other Ambulatory Visit: Payer: Self-pay

## 2018-12-23 DIAGNOSIS — R197 Diarrhea, unspecified: Secondary | ICD-10-CM | POA: Diagnosis not present

## 2018-12-25 LAB — CLOSTRIDIUM DIFFICILE EIA: C difficile Toxins A+B, EIA: NEGATIVE

## 2018-12-26 LAB — OVA AND PARASITE EXAMINATION

## 2018-12-27 ENCOUNTER — Telehealth: Payer: Self-pay

## 2018-12-27 LAB — FECAL LEUKOCYTES

## 2018-12-27 NOTE — Telephone Encounter (Signed)
Called and spoke with patient, relayed the message to him about his results. He states that he is still having diarrhea.    Copied from [name] [phone]. Topic: General - Inquiry >> Dec 27, 2018 11:20 AM Sean Reyes, NT wrote: Reason for CRM: Pt called in stating he would like a CMA to go over labs with him. Please advise.

## 2018-12-27 NOTE — Telephone Encounter (Signed)
Patient notified

## 2018-12-27 NOTE — Telephone Encounter (Signed)
I would say at this point it's reasonable to have him set something up with [facility], looks like he's seen them in the past year so should still be an active pt. Happy to place a referral if they need that as well

## 2019-01-01 ENCOUNTER — Telehealth: Payer: Self-pay

## 2019-01-15 ENCOUNTER — Encounter: Payer: Self-pay | Admitting: Student in an Organized Health Care Education/Training Program

## 2019-01-15 ENCOUNTER — Telehealth: Payer: Self-pay

## 2019-01-15 DIAGNOSIS — K529 Noninfective gastroenteritis and colitis, unspecified: Secondary | ICD-10-CM | POA: Diagnosis not present

## 2019-01-15 DIAGNOSIS — K2100 Gastro-esophageal reflux disease with esophagitis, without bleeding: Secondary | ICD-10-CM | POA: Diagnosis not present

## 2019-01-15 DIAGNOSIS — R1310 Dysphagia, unspecified: Secondary | ICD-10-CM | POA: Diagnosis not present

## 2019-01-15 NOTE — Progress Notes (Signed)
Patient's Name: Sean Reyes.  MRN: [id number]  Referring Provider: Meade Maw, MD  DOB: 07/27/1940  PCP: [name], MD  DOS: 01/16/2019  Note by: Gillis Santa, MD  Service setting: Ambulatory outpatient  Specialty: Interventional Pain Management  Location: [facility] Virtual Visit  Visit type: Initial Patient Evaluation  Patient type: New Patient   Pain Management Virtual Encounter Note - Virtual Visit via [place] (real-time audio visits between healthcare provider and patient).   Patient's Phone No.:  [phone] (home); [phone] (mobile); (Preferred) [phone] [email or web address]  [place] [place] [facility] [place] [address] Phone: [phone] Fax: [phone]  [facility] - [place], [place] - [address] [place] [address] Phone: [phone] Fax: [phone]    Pre-screening note:  Our staff contacted Sean Reyes and offered him an "in person", "face-to-face" appointment versus a telephone encounter. He indicated preferring the telephone encounter, at this time.  Primary Reason(s) for Visit: Tele-Encounter for initial evaluation of one or more chronic problems (new to examiner) potentially causing chronic pain, and posing a threat to normal musculoskeletal function. (Level of risk: High) CC: Back Pain (low) and Hip Pain (left to left buttock)  I contacted Sean Reyes. on 01/16/2019 via video conference.      I clearly identified myself as Gillis Santa, MD. I verified that I was speaking with the correct person using two identifiers (Name: Sean Dollard., and date of birth: Jun 20, 1940).  Advanced Informed Consent I sought verbal advanced consent from Sean Reyes. for virtual visit interactions. I informed Sean Reyes of possible security and privacy concerns, risks, and limitations associated with providing "not-in-person" medical evaluation and  management services. I also informed Sean Reyes of the availability of "in-person" appointments. Finally, I informed him that there would be a charge for the virtual visit and that he could be  personally, fully or partially, financially responsible for it. Sean Reyes expressed understanding and agreed to proceed.   HPI  Sean Reyes is a 78 y.o. year old, male patient, contacted today for an initial evaluation of his chronic pain. 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  Pain Assessment: Location:     low back pain, left buttock and hip pain that radiates to his left groin. He reports stabbing sensation his bilateral buttocks when turning over in bed. He has had a prior hip replacement (left x 2, right x 1).  Radiating:  left groin, lateral thigh Onset:  many  Years ago Duration:  chronic Quality:  throbbing, aching, sharp, shooting, disabling Severity: 8 /10 (subjective, self-reported pain score)  Effect on ADL:  limits ability to walk for an extended period of time Timing:  worse after exertion and standing Modifying factors:  sitting, resting  Onset and Duration: Date of onset: about 6 years Cause of  pain: Unknown Severity: Getting worse,  NAS-11 at its worse: 8/10, NAS-11 at its best: 0/10, NAS-11 now: 0/10 and NAS-11 on the average: 8/10 Timing: Not influenced by the time of the day Aggravating Factors: Surgery made it worse Alleviating Factors: Resting and Sitting Associated Problems: Tingling and Pain that wakes patient up Quality of Pain: Aching, Constant, Shooting and Stabbing Previous Examinations or Tests: MRI scan and Neurosurgical evaluation Previous Treatments: Epidural steroid injections  Patient is a pleasant 78 year old male who presents with a chief complaint of low back pain, left buttock and hip pain that radiates into his left groin.  He also has occasional pain in his right buttock and right hip.  Of note patient has an extensive history of lumbar spine surgery, 3 in total.  He has a L2-L5 lumbar spinal fusion.  He states that he has severe pain that radiates down his left leg in his buttock.  He has difficulty with walking.  It is relieved when he sits down.  He has completed physical therapy in the past in August 2020 without relief.  He has tried epidural injections in 2019 prior to surgery.  This was not beneficial.  He also had an SI joint injection approximately July 2020 that was not helpful.  In regards to his surgeries, he has a L5-S1 posterior lumbar interbody fusion formed on 04/19/2018 as well as a L2-L5 spinal fusion which was done in 2019.  Patient does have atrial fibrillation is status post ablation procedure.  He is currently on Eliquis for that.  He has stopped this in the past.  Most recently for his lumbar puncture myelogram.  He is being referred here for consideration of spinal cord stimulator trial.   Meds   Current Outpatient Medications:  .  amLODipine (NORVASC) 5 MG tablet, Take 1 tablet (5 mg total) by mouth daily., Disp: 30 tablet, Rfl: 12 .  benazepril (LOTENSIN) 40 MG tablet, Take 1 tablet (40 mg total) by mouth daily., Disp: 90 tablet, Rfl: 3 .   ELIQUIS 5 MG TABS tablet, Take 5 mg by mouth 2 (two) times daily. , Disp: , Rfl:  .  Ensure Max Protein (ENSURE MAX PROTEIN) LIQD, Take 330 mLs (11 oz total) by mouth 2 (two) times daily between meals. (Patient taking differently: Take 11 oz by mouth daily. ), Disp: 330 mL, Rfl: 1 .  finasteride (PROSCAR) 5 MG tablet, Take 1 tablet (5 mg total) by mouth daily. (Patient taking differently: Take 5 mg by mouth at bedtime. ), Disp: 90 tablet, Rfl: 2 .  metFORMIN (GLUCOPHAGE-XR) 500 MG 24 hr tablet, Take 2 tablets (1,000 mg total) by mouth 2 (two) times daily., Disp: 360 tablet, Rfl: 3 .  metoprolol tartrate (LOPRESSOR) 50 MG tablet, Take 50 mg by mouth 2 (two) times daily., Disp: , Rfl:  .  pantoprazole (PROTONIX) 40 MG tablet, Take 40 mg by mouth at bedtime. , Disp: , Rfl:  .  propafenone (RYTHMOL SR) 425 MG 12 hr capsule, Take 425 mg by mouth 2 (two) times daily., Disp: , Rfl:  .  acetaminophen (TYLENOL) 500 MG tablet, Take 500 mg by mouth every 6 (six) hours as needed (pain)., Disp: , Rfl:   ROS  Cardiovascular: Heart trouble, Abnormal heart rhythm, High blood pressure and Blood thinners:  Anticoagulant Pulmonary or Respiratory: Snoring  Neurological: No reported neurological signs or symptoms such as seizures, abnormal skin sensations, urinary and/or fecal incontinence, being born with an abnormal open spine and/or a tethered spinal cord Review of Past Neurological Studies: No results  found for this or any previous visit. Psychological-Psychiatric: Anxiousness and Depressed Gastrointestinal: Reflux or heatburn Genitourinary: Passing kidney stones Hematological: Weakness due to low blood hemoglobin or red blood cell count (Anemia) Endocrine: High blood sugar controlled without the use of insulin (NIDDM) Rheumatologic: Joint aches and or swelling due to excess weight (Osteoarthritis) Musculoskeletal: Negative for myasthenia gravis, muscular dystrophy, multiple sclerosis or malignant  hyperthermia Work History: Retired  Allergies  Sean Reyes is allergic to levaquin [levofloxacin in d5w]; shellfish allergy; amiodarone; and adhesive [tape].  Laboratory Chemistry Profile   Screening Lab Results  Component Value Date   SARSCOV2NAA NEGATIVE 09/20/2018   STAPHAUREUS NEGATIVE 04/19/2018   MRSAPCR NEGATIVE 04/19/2018    Inflammation (CRP: Acute Phase) (ESR: Chronic Phase) No results found for: CRP, ESRSEDRATE, LATICACIDVEN                       Rheumatology No results found for: RF, ANA, LABURIC, URICUR, LYMEIGGIGMAB, LYMEABIGMQN, HLAB27                      Renal Lab Results  Component Value Date   BUN 24 12/19/2018   CREATININE 1.31 (H) 12/19/2018   BCR 18 12/19/2018   GFRAA 61 12/19/2018   GFRNONAA 53 (L) 12/19/2018                             Hepatic Lab Results  Component Value Date   AST 15 12/19/2018   ALT 10 12/19/2018   ALBUMIN 4.2 12/19/2018   ALKPHOS 85 12/19/2018   LIPASE 25 12/19/2018                        Electrolytes Lab Results  Component Value Date   NA 138 12/19/2018   K 4.5 12/19/2018   CL 101 12/19/2018   CALCIUM 10.0 12/19/2018   MG 2.1 07/23/2018                        Neuropathy Lab Results  Component Value Date   HGBA1C 6.7 (H) 07/19/2018                        CNS No results found for: COLORCSF, APPEARCSF, RBCCOUNTCSF, WBCCSF, POLYSCSF, LYMPHSCSF, EOSCSF, PROTEINCSF, GLUCCSF, JCVIRUS, CSFOLI, IGGCSF, LABACHR, ACETBL                      Bone Lab Results  Component Value Date   TESTOFREE 6.0 (L) 09/01/2015   TESTOSTERONE 642 12/11/2016                         Coagulation Lab Results  Component Value Date   INR 1.0 11/07/2018   LABPROT 12.6 11/07/2018   APTT 30 11/07/2018   PLT 179 12/19/2018                        Cardiovascular Lab Results  Component Value Date   TROPONINI <0.03 08/08/2014   HGB 11.6 (L) 12/19/2018   HCT 33.8 (L) 12/19/2018                         ID Lab Results  Component  Value Date   SARSCOV2NAA NEGATIVE 09/20/2018   STAPHAUREUS NEGATIVE 04/19/2018   MRSAPCR NEGATIVE 04/19/2018  Cancer No results found for: CEA, CA125, LABCA2                      Endocrine Lab Results  Component Value Date   TSH 3.320 12/19/2018   TESTOFREE 6.0 (L) 09/01/2015   TESTOSTERONE 642 12/11/2016   SHBG 30.7 09/01/2015                        Note: Lab results reviewed.  Imaging Review    Lumbosacral Imaging: Lumbar MR wo contrast:  Results for orders placed during the hospital encounter of 07/26/16  MR LUMBAR SPINE WO CONTRAST   Narrative CLINICAL DATA:  Chronic right flank pain.  No known injury.  EXAM: MRI LUMBAR SPINE WITHOUT CONTRAST  TECHNIQUE: Multiplanar, multisequence MR imaging of the lumbar spine was performed. No intravenous contrast was administered.  COMPARISON:  MRI lumbar spine 06/17/2015.  FINDINGS: Segmentation:  Standard.  Alignment:  Mild convex left curvature is noted and unchanged.  Vertebrae: No fracture or worrisome lesion. Degenerative endplate signal change is identified eccentric to the left at L2-3 and to the right at L4-5. Hemangiomas in L4 and L5 are noted. No worrisome lesion.  Conus medullaris: Extends to the T12 level and appears normal.  Paraspinal and other soft tissues: Negative.  Disc levels:  T11-12 and T12-L1 are imaged in the sagittal plane only. Shallow disc bulge at T11-12 without stenosis is noted. T12-L1 is negative.  L1-2:  Negative.  L2-3: Ligamentum flavum thickening, facet degenerative disease and a broad-based disc bulge are unchanged. Moderate central canal stenosis and bilateral subarticular recess narrowing persist. The foramina are open.  L3-4: Advanced bilateral facet degenerative disease and a shallow disc bulge more prominent to the right are again seen. Small synovial cyst in the right ligamentum flavum seen on the prior examination is not appreciated on this study. Mild to  moderate central canal narrowing and foraminal narrowing, more notable on the right, are unchanged.  L4-5: Right worse than left facet degenerative disease, shallow broad-based protrusion and ligamentum flavum thickening are seen. Disc and facet arthropathy cause moderately severe right foraminal narrowing which is not notably changed. Mild left foraminal narrowing is also again seen. There is mild central canal and right subarticular recess stenosis. Two tiny synovial cysts posterior to the right lamina and facet measuring 0.3 cm are noted.  L5-S1: There is a new small synovial cyst off the medial margin of the left facet joint in the periphery of the foramen. The cyst measures 0.9 cm transverse by 0.3 cm craniocaudal by 0.3 cm AP. The cyst does not impact the exiting left L5 root. The right foramen is open. Narrowing in the left subarticular recess at this level due to disc and facet arthropathy is again seen. Synovial cyst posterior to the right facet joint has involuted.  IMPRESSION: Mild to moderate central canal narrowing at L3-4 is unchanged. Mild to moderate foraminal narrowing is more notable on the right and also it unchanged.  Mildly severe right foraminal narrowing due to disc and facet arthropathy at L4-5 is unchanged. There is also mild central canal and right subarticular recess narrowing at this level.  New small synovial cyst off the anterior margin of the left facet at L5-S1 does not appear to cause left foraminal stenosis. Narrowing in the left subarticular recess at this level due to disc and facet arthropathy is unchanged.   Electronically Signed   By: Inge Rise M.D.  On: 07/26/2016 12:15     Lumbar MR w/wo contrast:  Results for orders placed during the hospital encounter of 12/25/17  MR Lumbar Spine W Wo Contrast   Narrative CLINICAL DATA:  Chronic low back pain for 5-6 weeks. Severe left hip and buttock pain.  EXAM: MRI LUMBAR SPINE  WITHOUT AND WITH CONTRAST  TECHNIQUE: Multiplanar and multiecho pulse sequences of the lumbar spine were obtained without and with intravenous contrast.  CONTRAST:  9 mL Gadavist  COMPARISON:  01/23/2017  FINDINGS: Segmentation:  Standard.  Alignment:  Physiologic.  Vertebrae:  No fracture, evidence of discitis, or bone lesion.  Conus medullaris and cauda equina: Conus extends to the T12 level. Conus and cauda equina appear normal.  Paraspinal and other soft tissues: Postsurgical changes in the posterior paraspinal soft tissues. No acute paraspinal abnormality.  Disc levels:  Disc spaces: Posterior lumbar interbody fusion from L2 through L5. Degenerative disc disease mild disc height loss at L5-S1.  T12-L1: No significant disc bulge. No evidence of neural foraminal stenosis. No central canal stenosis.  L1-L2: No significant disc bulge. No evidence of neural foraminal stenosis. No central canal stenosis. Mild bilateral facet arthropathy.  L2-L3: Interbody fusion. No evidence of neural foraminal stenosis. No central canal stenosis.  L3-L4: Interbody fusion. No evidence of neural foraminal stenosis. No central canal stenosis.  L4-L5: Bulging residual disc material. Moderate right foraminal stenosis. Mild left foraminal stenosis. No central canal stenosis.  L5-S1: Broad-based disc bulge with a right paracentral disc extrusion. Left lateral recess stenosis. Moderate bilateral facet arthropathy, left worse than right. 6 mm left facet synovial cyst abutting the foraminal component of the left L5 nerve root. Mild left foraminal stenosis. Mild right foraminal stenosis. No central canal stenosis.  IMPRESSION: 1. At L5-S1 there is a broad-based disc bulge with a right paracentral disc extrusion. Left lateral recess stenosis. Moderate bilateral facet arthropathy, left worse than right. 6 mm left facet synovial cyst abutting the foraminal component of the left L5  nerve root. Mild left foraminal stenosis. Mild right foraminal stenosis. 2. Posterior lumbar interbody fusion from L2 through L5. Moderate right foraminal stenosis and mild left foraminal stenosis at L4-5.   Electronically Signed   By: Kathreen Devoid   On: 12/25/2017 12:23     Lumbar CT wo contrast:  Results for orders placed during the hospital encounter of 07/03/18  CT LUMBAR SPINE WO CONTRAST   Narrative CLINICAL DATA:  Recent L5-S1 PLIF surgery, with redo L4-5 fusion. Legs are now weak. LEFT-sided pain.  EXAM: CT LUMBAR SPINE WITHOUT CONTRAST  TECHNIQUE: Multidetector CT imaging of the lumbar spine was performed without intravenous contrast administration. Multiplanar CT image reconstructions were also generated.  COMPARISON:  Radiographs at ordering provider office 05/30/2018 and 07/01/2018 preoperative MRI 12/25/2017.  FINDINGS: Segmentation: Standard.  Alignment: Anatomic.  Vertebrae: No acute fracture or focal pathologic process.  Paraspinal and other soft tissues: Unremarkable. Aortic atherosclerosis.  Disc levels:  L1-L2:  Mild facet arthropathy. No impingement.  L2-L3:  Solid interbody and posterior arthrodesis. Hardware intact.  L3-L4:  Solid interbody and posterior arthrodesis. Hardware intact.  L4-L5: Developing posterior and interbody arthrodesis. No impingement. RIGHT L5 screw removed, appears uncomplicated.  L5-S1: Status post decompressive laminectomy, status post PLIF. S1 screws in good position. BILATERAL interbody cages demonstrate subsidence particularly into L5 bilaterally. Vacuum phenomenon, within the interspace, concerning for motion at this segment. Peripherally calcified disc material and/or osseous spurring extends more to the LEFT than RIGHT, in the paraspinous regions, and there  is some BILATERAL foraminal narrowing although not clearly compressive. No stenosis.  IMPRESSION: 1. Status post L5-S1 PLIF. Subsidence of the BILATERAL  cages along with vacuum phenomenon, concerning for motion at this segment. Continued short-term surveillance warranted. 2. Peripherally calcified disc material and/or osseous spurring at L5-S1 extends more to the LEFT than the RIGHT in the paraspinous regions, although compressive foraminal narrowing is not definitely present. 3. Developing posterior and interbody arthrodesis at L4-5. Solid arthrodesis L3-4 and L2-3.   Electronically Signed   By: Staci Righter M.D.   On: 07/03/2018 17:22     Results for orders placed during the hospital encounter of 11/07/18  CT LUMBAR SPINE W CONTRAST   Narrative CLINICAL DATA:  Low back and left leg pain. Prior lumbar surgeries.  EXAM: CT MYELOGRAPHY LUMBAR SPINE  TECHNIQUE: CT imaging of the lumbar spine was performed after Isovue 200M contrast administration. Multiplanar CT image reconstructions were also generated.  COMPARISON:  Noncontrast lumbar spine CT 07/03/2018 and MRI 12/25/2017  FINDINGS: Lumbar puncture for intrathecal contrast administration was performed by Dr. Laqueta Carina and dictated separately.  Segmentation:  5 lumbar type vertebral bodies.  Alignment: Slight left convex curvature of the lower lumbar spine. No listhesis.  Vertebrae: No acute fracture or suspicious osseous lesion. Sequelae of L2-S1 fusion are again identified. Pedicle screws remain in place bilaterally at each level except for L5 where there is only a left-sided screw. There is abnormal lucency diffusely along the right greater than left S1 screws which has increased from the prior CT. Mild lucency about the left L5 screw is unchanged. Subsidence of both L5-S1 interbody cages is again noted with persistent gas in the interspace. There is some bridging bone across the posterior aspect of the L5-S1 disc space, and there also appears to be some developing posterolateral osseous fusion on the left. Solid posterolateral osseous fusion is present from  L2-L5.  Conus medullaris: Extends to the L1 level and appears normal.  Paraspinal and other soft tissues: Aortic atherosclerosis. Postoperative changes in the posterior lumbar soft tissues.  Disc levels:  T12-L1: Solid bridging anterior vertebral osteophyte. No disc herniation or stenosis.  L1-2: Mild facet arthrosis without disc herniation or stenosis.  L2-3: Posterior decompression and fusion. No stenosis.  L3-4: Posterior decompression and fusion. No stenosis.  L4-5: Posterior decompression and fusion. Endplate spurring, disc space height loss, and facet hypertrophy result in mild right neural foraminal stenosis without spinal stenosis, unchanged.  L5-S1: Posterior decompression and fusion. Endplate spurring results in mild bilateral neural foraminal stenosis without spinal stenosis, unchanged.  IMPRESSION: 1. Prior L2-S1 fusion. Increased lucency about the S1 screws suggestive of loosening and with persistent gas in the L5-S1 disc space suggesting abnormal motion. 2. Unchanged mild neural foraminal stenosis at L4-5 and L5-S1. 3. No spinal stenosis. 4. Aortic Atherosclerosis (ICD10-I70.0).   Electronically Signed   By: Logan Bores M.D.   On: 11/07/2018 15:31     Results for orders placed during the hospital encounter of 04/29/18  DG Lumbar Spine 2-3 Views   Narrative CLINICAL DATA:  78 year old male. Posterior lumbar fusion. Initial encounter.  EXAM: LUMBAR SPINE - 2-3 VIEW; DG C-ARM 61-120 MIN  Fluoroscopic time: 22 seconds.  COMPARISON:  11/13/2017.  FINDINGS: Two intraoperative C-arm views submitted for review after surgery.  Only portion of prior L2 through L5 fusion is noted. Posterior bar removed. Placement of pedicle screws S1 level. Interbody spacer placed L5-S1 level.  IMPRESSION: Extension of fusion to L5-S1 level. Complete fusion can be assessed  on follow-up.   Electronically Signed   By: Genia Del M.D.   On: 04/29/2018 10:32     Lumbar DG (Complete) 4+V:  Results for orders placed during the hospital encounter of 06/27/17  DG Lumbar Spine Complete   Narrative CLINICAL DATA:  L2 through L5  PLIF  EXAM: DG C-ARM 61-120 MIN; LUMBAR SPINE - COMPLETE 4+ VIEW  COMPARISON:  CT 03/30/2017  FINDINGS: Four low resolution intraoperative spot views of the lumbar spine. Total fluoroscopy time was 6 minutes 3 seconds. Images demonstrate placement of fixating screws and interbody devices within the lumbar spine.  IMPRESSION: Intraoperative fluoroscopic assistance provided during lumbar spine surgery.   Electronically Signed   By: Donavan Foil M.D.   On: 06/27/2017 20:24           Lumbar DG Myelogram views:  Results for orders placed during the hospital encounter of 11/07/18  DG Myelogram Lumbar   Narrative CLINICAL DATA:  Spinal stenosis, status post lumbar fusion  FLUOROSCOPY TIME:  00:48  PROCEDURE: LUMBAR MYELOGRAM  COMPARISON:  CT lumbar spine, 07/03/2018  FINDINGS: After thorough discussion of risks and benefits of the procedure including bleeding, infection, injury to nerves, blood vessels, adjacent structures as well as headache and CSF leak, written and oral informed consent was obtained. Consent was obtained by Dr. Eddie Candle. Time out form was completed.  Patient was positioned prone on the fluoroscopy table. Local anesthesia was provided with 1% lidocaine without epinephrine after prepped and draped in the usual sterile fashion. Puncture was performed at L4-L5 using a 3 1/2 inch 20-gauge spinal needle via midline L4-L5 approach, this level with laminectomy. Using a single pass through the dura, the needle was placed within the thecal sac, with return of clear CSF. 18 mL of Omnipaque 180 was injected into the thecal sac, with normal opacification of the nerve roots and cauda equina consistent with free flow within the subarachnoid space.  IMPRESSION: Successful fluoroscopic guided  lumbar puncture for myelographic injection. Please see separately dictated myelographic CT examination of the lumbar spine.   Electronically Signed   By: Eddie Candle M.D.   On: 11/07/2018 09:16      Complexity Note: Imaging results reviewed. Results shared with Sean Reyes, using Layman's terms.                         PFSH  Drug: Sean Reyes  reports no history of drug use. Alcohol:  reports no history of alcohol use. Tobacco:  reports that he has never smoked. He has never used smokeless tobacco. Medical:  has a past medical history of Anemia, Anxiety, Arthritis, Atrial fibrillation ([place]), Cataracts, bilateral, Complication of anesthesia, Depression, Diabetes ([place]), Dysrhythmia, GERD (gastroesophageal reflux disease), History of kidney stones, HOH (hard of hearing), Hyperlipidemia, Hypertension, Nocturia, S/P ablation of atrial fibrillation, Sleep apnea, and Tachycardia, unspecified. Family: family history includes Brain cancer in his mother.  Past Surgical History:  Procedure Laterality Date  . ABLATION    . ANTERIOR LAT LUMBAR FUSION N/A 06/27/2017   Procedure: Anterior Lateral Lumbar Interbody  Fusion - Lumbar Two-Lumbar Three - Lumbar Three-Lumbar Four, Posterior Lumbar Interbody Fusion Lumbar Four- Five;  Surgeon: Kary Kos, MD;  Location: [place];  Service: Neurosurgery;  Laterality: N/A;  Anterior Lateral Lumbar Interbody  Fusion - Lumbar Two-Lumbar Three - Lumbar Three-Lumbar Four, Posterior Lumbar Interbody Fusion Lumbar Four- Five  . BACK SURGERY    . CARDIOVERSION N/A 08/29/2018   Procedure: CARDIOVERSION;  Surgeon: Corey Skains, MD;  Location: [facility];  Service: Cardiovascular;  Laterality: N/A;  . CARDIOVERSION N/A 09/24/2018   Procedure: CARDIOVERSION;  Surgeon: Corey Skains, MD;  Location: [facility];  Service: Cardiovascular;  Laterality: N/A;  . COLONOSCOPY WITH PROPOFOL N/A 10/05/2015   Procedure: COLONOSCOPY WITH PROPOFOL;  Surgeon: Lollie Sails, MD;   Location: [facility];  Service: Endoscopy;  Laterality: N/A;  . ESOPHAGOGASTRODUODENOSCOPY (EGD) WITH PROPOFOL N/A 04/01/2018   Procedure: ESOPHAGOGASTRODUODENOSCOPY (EGD) WITH PROPOFOL;  Surgeon: Lollie Sails, MD;  Location[facility] ENDOSCOPY;  Service: Endoscopy;  Laterality: N/A;  . HERNIA REPAIR    . JOINT REPLACEMENT Bilateral    hips  RT+  LEFT X2   . LUMBAR LAMINECTOMY/DECOMPRESSION MICRODISCECTOMY Left 09/13/2016   Procedure: Microdiscectomy - Lumbar two-three,  Lumbar three- - left;  Surgeon: Kary Kos, MD;  Location: [place];  Service: Neurosurgery;  Laterality: Left;  . TONSILLECTOMY     Active Ambulatory Problems    Diagnosis Date Noted  . Atrial fibrillation ([place]) 07/27/2014  . Hypertension 09/28/2014  . 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[place]) 09/28/2014  . Anxiety 10/13/2014  . 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 H/O prior ablation treatment 10/19/2011  . Paroxysmal atrial fibrillation ([place]) 10/19/2011  . Chronic anticoagulation 10/26/2014  . Lumbar degenerative disc disease 12/23/2014  . 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 OSA (obstructive sleep apnea) 03/23/2015  . 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 Anemia 06/30/2015  . Low testosterone 10/07/2015  . Arthritis 10/20/2011  . Mixed hyperlipidemia 07/14/2015  . Spinal stenosis, lumbar region, 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/13/2016  . Advanced care planning/counseling discussion 09/28/2016  . Neurologic orthostatic hypotension ([place]) 06/29/2017  . HNP (herniated nucleus pulposus), lumbar 04/29/2018  . Diarrhea 07/19/2018  . 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 Postlaminectomy syndrome, lumbar region 01/16/2019  . History of fusion of lumbar spine (L2-L5) 01/16/2019  . Chronic radicular lumbar pain 01/16/2019  . Lumbar radiculopathy 01/16/2019   Resolved Ambulatory Problems    Diagnosis Date Noted  . Right-sided nosebleed  03/23/2015  . Benign essential HTN 09/02/2014  . Salmonella food poisoning 07/19/2018  . Salmonella gastroenteritis 07/24/2018  . 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 Cataracts, bilateral   . Complication of anesthesia   . Depression   . Diabetes ([place])   . Dysrhythmia   . GERD (gastroesophageal reflux disease)   . History of kidney stones   . HOH (hard of hearing)   . Hyperlipidemia   . Nocturia   . S/P ablation of atrial fibrillation   . Sleep apnea   . Tachycardia, unspecified    Assessment  Primary Diagnosis & Pertinent Problem List: The primary encounter diagnosis was Failed back surgical syndrome. Diagnoses of Postlaminectomy syndrome, lumbar region, History of fusion of lumbar spine (L2-L5), Chronic radicular lumbar pain, Spinal stenosis, lumbar region, with neurogenic claudication, and Lumbar radiculopathy were also pertinent to this visit.  Visit Diagnosis (New problems to examiner): 1. Failed back surgical syndrome   2. Postlaminectomy syndrome, lumbar region   3. History of fusion of lumbar spine (L2-L5)   4. Chronic radicular lumbar pain   5. Spinal stenosis, lumbar region, with neurogenic claudication   6. Lumbar radiculopathy    78 year old male with history of failed back surgical syndrome, postlaminectomy pain syndrome, lumbar spine fusion L2-L5 who presents with chronic and worsening low back pain with radiation into left  greater than right hip and groin region.  Patient has been referred for consideration of spinal cord stimulator trial.  We had an extensive discussion about spinal cord stimulator trial and what it entails.  One of my concerns for the patient's interlaminar windows at T12-L1 or L1-L2 given his extensive lumbar spine fusion from L2-L5.  Patient has tried a lumbar epidural steroid injection prior to surgery and then has tried a SI joint injection approximately 3 months ago.  We discussed a caudal  epidural steroid injection to see if his radicular symptoms are responsive.  I will also evaluate the patient's T12-L1 and L1-L2 interlaminar windows on fluoroscopy on the day of his caudal ESI.  If his interlaminar windows allow for safe percutaneous access, I will pursue further work-up via thoracic MRI and lumbar MRI for pretrial planning.  Patient will also require psych eval prior to spinal cord stimulator trial.  I informed him of the work-up plan.  He was in agreement.  He states that his wife is having hip replacement surgery at the end of this month .   Of note patient is on Eliquis for his atrial fibrillation.  He will need cardiac clearance from his cardiologist to be off for 3 days prior to his spinal procedure.  We will reach out to his cardiologist and obtain clearance.  Plan of Care (Initial workup plan)   Procedure Orders     Caudal ESI (Schedule)  Interventional management options: Mr. Shew was informed that there is no guarantee that he would be a candidate for interventional therapies. The decision will be based on the results of diagnostic studies, as well as Mr. Steen risk profile.  Procedure(s) under consideration:  SCS Trial   Provider-requested follow-up: Return in about 2 weeks (around 01/30/2019) for caudal/Eliquis.  Future Appointments  Date Time Provider [place]  01/20/2019 10:30 AM Marnee Guarneri T, NP [facility]  02/21/2019  1:00 PM [facility] CASE MANAGER [facility]    Total duration of non-face-to-face encounter: 45 minutes.  Primary Care Physician: [name], MD Location: [facility] Note by: Gillis Santa, MD Date: 01/16/2019; Time: 11:10 AM  Note: This dictation was prepared with Dragon dictation. Any transcriptional errors that may result from this process are unintentional.

## 2019-01-15 NOTE — Telephone Encounter (Signed)
Sean Reyes called back returning a call to [facility]

## 2019-01-15 NOTE — Progress Notes (Signed)
States he had a gradual onset of pain about 6 years ago in low back. States he has had 3 back surgeries since 2018.

## 2019-01-16 ENCOUNTER — Other Ambulatory Visit: Payer: Self-pay

## 2019-01-16 ENCOUNTER — Ambulatory Visit
Payer: Medicare Other | Attending: Student in an Organized Health Care Education/Training Program | Admitting: Student in an Organized Health Care Education/Training Program

## 2019-01-16 ENCOUNTER — Encounter: Payer: Self-pay | Admitting: Student in an Organized Health Care Education/Training Program

## 2019-01-16 DIAGNOSIS — G8929 Other chronic pain: Secondary | ICD-10-CM | POA: Diagnosis not present

## 2019-01-16 DIAGNOSIS — Z981 Arthrodesis status: Secondary | ICD-10-CM

## 2019-01-16 DIAGNOSIS — M961 Postlaminectomy syndrome, not elsewhere classified: Secondary | ICD-10-CM | POA: Insufficient documentation

## 2019-01-16 DIAGNOSIS — M5416 Radiculopathy, lumbar region: Secondary | ICD-10-CM | POA: Diagnosis not present

## 2019-01-16 DIAGNOSIS — M48062 Spinal stenosis, lumbar region with neurogenic claudication: Secondary | ICD-10-CM | POA: Diagnosis not present

## 2019-01-16 DIAGNOSIS — K529 Noninfective gastroenteritis and colitis, unspecified: Secondary | ICD-10-CM | POA: Diagnosis not present

## 2019-01-16 DIAGNOSIS — Q7649 Other congenital malformations of spine, not associated with scoliosis: Secondary | ICD-10-CM | POA: Insufficient documentation

## 2019-01-16 NOTE — Progress Notes (Signed)
done

## 2019-01-16 NOTE — Progress Notes (Signed)
Request sent by  [facility]

## 2019-01-17 ENCOUNTER — Telehealth: Payer: Self-pay

## 2019-01-17 ENCOUNTER — Other Ambulatory Visit: Payer: Self-pay

## 2019-01-17 NOTE — Telephone Encounter (Signed)
Called patient to inform him that Dr Nehemiah Massed approved him stopping  ELiquis 3 days before his appt. Patient with understanding

## 2019-01-19 ENCOUNTER — Encounter: Payer: Self-pay | Admitting: Nurse Practitioner

## 2019-01-19 DIAGNOSIS — N183 Chronic kidney disease, stage 3 unspecified: Secondary | ICD-10-CM | POA: Insufficient documentation

## 2019-01-19 DIAGNOSIS — D6869 Other thrombophilia: Secondary | ICD-10-CM | POA: Insufficient documentation

## 2019-01-20 ENCOUNTER — Encounter: Payer: Self-pay | Admitting: Nurse Practitioner

## 2019-01-20 ENCOUNTER — Other Ambulatory Visit: Payer: Self-pay

## 2019-01-20 ENCOUNTER — Ambulatory Visit (INDEPENDENT_AMBULATORY_CARE_PROVIDER_SITE_OTHER): Payer: Medicare Other | Admitting: Nurse Practitioner

## 2019-01-20 VITALS — BP 133/82 | HR 57 | Temp 98.1°F | Ht 71.5 in | Wt 192.2 lb

## 2019-01-20 DIAGNOSIS — M961 Postlaminectomy syndrome, not elsewhere classified: Secondary | ICD-10-CM | POA: Diagnosis not present

## 2019-01-20 DIAGNOSIS — I10 Essential (primary) hypertension: Secondary | ICD-10-CM | POA: Diagnosis not present

## 2019-01-20 DIAGNOSIS — E1121 Type 2 diabetes mellitus with diabetic nephropathy: Secondary | ICD-10-CM | POA: Diagnosis not present

## 2019-01-20 DIAGNOSIS — D649 Anemia, unspecified: Secondary | ICD-10-CM | POA: Diagnosis not present

## 2019-01-20 DIAGNOSIS — E785 Hyperlipidemia, unspecified: Secondary | ICD-10-CM | POA: Diagnosis not present

## 2019-01-20 DIAGNOSIS — Z0000 Encounter for general adult medical examination without abnormal findings: Secondary | ICD-10-CM

## 2019-01-20 DIAGNOSIS — E1169 Type 2 diabetes mellitus with other specified complication: Secondary | ICD-10-CM

## 2019-01-20 DIAGNOSIS — D6869 Other thrombophilia: Secondary | ICD-10-CM

## 2019-01-20 DIAGNOSIS — N1831 Chronic kidney disease, stage 3a: Secondary | ICD-10-CM

## 2019-01-20 DIAGNOSIS — I480 Paroxysmal atrial fibrillation: Secondary | ICD-10-CM

## 2019-01-20 DIAGNOSIS — E1159 Type 2 diabetes mellitus with other circulatory complications: Secondary | ICD-10-CM

## 2019-01-20 DIAGNOSIS — I152 Hypertension secondary to endocrine disorders: Secondary | ICD-10-CM

## 2019-01-20 DIAGNOSIS — E1122 Type 2 diabetes mellitus with diabetic chronic kidney disease: Secondary | ICD-10-CM

## 2019-01-20 LAB — BAYER DCA HB A1C WAIVED: HB A1C (BAYER DCA - WAIVED): 6.4 % (ref <7.0)

## 2019-01-20 LAB — MICROALBUMIN, URINE WAIVED
Creatinine, Urine Waived: 200 mg/dL (ref 10–300)
Microalb, Ur Waived: 80 mg/L — ABNORMAL HIGH (ref 0–19)

## 2019-01-20 MED ORDER — ELIQUIS 5 MG PO TABS: 5.0000 mg | ORAL_TABLET | Freq: Two times a day (BID) | ORAL | 5 refills | Status: DC

## 2019-01-20 NOTE — Assessment & Plan Note (Signed)
Chronic, ongoing with A1C 6.4% today.  Remains below goal.  Urine ALB 80 and A:C >30-300.  Continue Benazepril for kidney protection and may consider reduction of Metformin in future dependent on kidney function on labs when checked.  Educated him on kidney disease and monitoring.  Consider referral to nephrology in future if ever any worsening of function noted.

## 2019-01-20 NOTE — Assessment & Plan Note (Signed)
Chronic, ongoing.  Continue current medication regimen and collaboration with cardiology.  Refill on Eliquis sent.  Monitor CBC closely, H/H some mild decline noted 6 months after starting Eliquis.

## 2019-01-20 NOTE — Assessment & Plan Note (Signed)
Continue collaboration with pain clinic providers.

## 2019-01-20 NOTE — Progress Notes (Signed)
BP 133/82 (BP Location: Left Arm, Cuff Size: Normal)   Pulse (!) 57   Temp 98.1 F (36.7 C) (Oral)   Ht 5' 11.5" (1.816 m)   Wt 192 lb 3.2 oz (87.2 kg)   SpO2 98%   BMI 26.43 kg/m    Subjective:    Patient ID: Sean Hacker., male    DOB: 1940-03-04, 78 y.o.   MRN: [id number]  HPI: Sean Sonsalla. is a 78 y.o. male presenting on 01/20/2019 for comprehensive medical examination. Current medical complaints include:none  He currently lives with: wife Interim Problems from his last visit: no   DIABETES Recent A1C 6.7% in June.  Continues on Metformin daily. Hypoglycemic episodes:no Polydipsia/polyuria: no Visual disturbance: no Chest pain: no Paresthesias: no Glucose Monitoring: yes  Accucheck frequency: a couple times week  Fasting glucose: 120-130  Post prandial:  Evening:  Before meals: Taking Insulin?: no  Long acting insulin:  Short acting insulin: Blood Pressure Monitoring: a few times a day Retinal Examination: Up to Date Foot Exam: Up to Date Pneumovax: Up to Date Influenza: Up to Date Aspirin: no   HYPERTENSION / HYPERLIPIDEMIA Followed by Dr. Nehemiah Massed for cardiology.  Last seen 10/07/18 with no changes made. Does have some anemia noted on labs, recent in November 11.6/33.8.  Started on Eliquis over a year ago. Satisfied with current treatment? yes Duration of hypertension: chronic BP monitoring frequency: a few times a week BP range: 130/80's  BP medication side effects: no Duration of hyperlipidemia: chronic Cholesterol medication side effects: no Cholesterol supplements: none Medication compliance: good compliance Aspirin: no Recent stressors: no Recurrent headaches: no Visual changes: no Palpitations: no Dyspnea: no Chest pain: no Lower extremity edema: no Dizzy/lightheaded: no   CHRONIC KIDNEY DISEASE November GFR 53 and CRT 1.31 === CrCl 44.26 CKD status: stable Medications renally dose: yes Previous renal evaluation: no Pneumovax:   Up to Date Influenza Vaccine:  Up to Date   ATRIAL FIBRILLATION Atrial fibrillation status: stable Satisfied with current treatment: yes  Medication side effects:  no Medication compliance: good compliance Etiology of atrial fibrillation:  Palpitations:  no Chest pain:  no Dyspnea on exertion:  no Orthopnea:  no Syncope:  no Edema:  no Ventricular rate control: Amiodarone Anti-coagulation: long acting   CHRONIC PAIN: Followed by pain clinic for chronic low back pain.  Last seen 01/16/2019.  Pain started after back surgery.  Reports he is in line to get a pain stimulator placed.  Functional Status Survey: Is the patient deaf or have difficulty hearing?: Yes Does the patient have difficulty seeing, even when wearing glasses/contacts?: Yes Does the patient have difficulty concentrating, remembering, or making decisions?: No Does the patient have difficulty walking or climbing stairs?: Yes Does the patient have difficulty dressing or bathing?: Yes Does the patient have difficulty doing errands alone such as visiting a doctor's office or shopping?: Yes  FALL RISK: Fall Risk  08/12/2018 03/06/2018 07/27/2017 05/02/2017 12/20/2016  Falls in the past year? 0 0 No Yes Yes  Number falls in past yr: - - - 1 1  Injury with Fall? - - - Yes Yes  Risk Factor Category  - - - - High Fall Risk  Follow up - Falls evaluation completed - Falls evaluation completed Falls evaluation completed    Depression Screen Depression screen [facility] 2/9 01/20/2019 08/12/2018 07/27/2017 05/02/2017 12/20/2016  Decreased Interest 0 0 0 0 0  Down, Depressed, Hopeless 0 0 0 0 0  PHQ - 2 Score 0  0 0 0 0  Altered sleeping - - - 0 -  Tired, decreased energy - - - 0 -  Change in appetite - - - 0 -  Feeling bad or failure about yourself  - - - 0 -  Trouble concentrating - - - 0 -  Moving slowly or fidgety/restless - - - 0 -  Suicidal thoughts - - - 0 -  PHQ-9 Score - - - 0 -    Advanced Directives <no information>   Past Medical History:  Past Medical History:  Diagnosis Date  . Anemia   . Anxiety   . Arthritis   . Atrial fibrillation ([place])   . Cataracts, bilateral   . Complication of anesthesia    pt reports low BP's after surgery at [facility] and difficulty awakening  . Depression   . Diabetes ([place])    dx 6-8 yrs ago  . Dysrhythmia    a-fib  . GERD (gastroesophageal reflux disease)    OCC TAKES ALKA SELTZER  . History of kidney stones    10-15 yrs ago  . 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 Hyperlipidemia   . Hypertension   . Nocturia   . S/P ablation of atrial fibrillation    Ablative therapy  . Sleep apnea    CPAP   . Tachycardia, unspecified     Surgical History:  Past Surgical History:  Procedure Laterality Date  . ABLATION    . ANTERIOR LAT LUMBAR FUSION N/A 06/27/2017   Procedure: Anterior Lateral Lumbar Interbody  Fusion - Lumbar Two-Lumbar Three - Lumbar Three-Lumbar Four, Posterior Lumbar Interbody Fusion Lumbar Four- Five;  Surgeon: Kary Kos, MD;  Location: [place];  Service: Neurosurgery;  Laterality: N/A;  Anterior Lateral Lumbar Interbody  Fusion - Lumbar Two-Lumbar Three - Lumbar Three-Lumbar Four, Posterior Lumbar Interbody Fusion Lumbar Four- Five  . BACK SURGERY    . CARDIOVERSION N/A 08/29/2018   Procedure: CARDIOVERSION;  Surgeon: Corey Skains, MD;  Location: [facility] ORS;  Service: Cardiovascular;  Laterality: N/A;  . CARDIOVERSION N/A 09/24/2018   Procedure: CARDIOVERSION;  Surgeon: Corey Skains, MD;  Location: [facility] OR[facility];  Service: Cardiovascular;  Laterality: N/A;  . COLONOSCOPY WITH PROPOFOL N/A 10/05/2015   Procedure: COLONOSCOPY WITH PROPOFOL;  Surgeon: Lollie Sails, MD;  Location: [facility] ENDOSCOPY;  Service: Endoscopy;  Laterality: N/A;  . ESOPHAGOGASTRODUODENOSCOPY (EGD) WITH PROPOFOL N/A 04/01/2018   Procedure: ESOPHAGOGASTRODUODENOSCOPY (EGD) WITH PROPOFOL;  Surgeon: Lollie Sails, MD;  Location: [facility] ENDOSCOPY;  Service:  Endoscopy;  Laterality: N/A;  . HERNIA REPAIR    . JOINT REPLACEMENT Bilateral    hips  RT+  LEFT X2   . LUMBAR LAMINECTOMY/DECOMPRESSION MICRODISCECTOMY Left 09/13/2016   Procedure: Microdiscectomy - Lumbar two-three,  Lumbar three- - left;  Surgeon: Kary Kos, MD;  Location: [place];  Service: Neurosurgery;  Laterality: Left;  . TONSILLECTOMY      Medications:  Current Outpatient Medications on File Prior to Visit  Medication Sig  . acetaminophen (TYLENOL) 500 MG tablet Take 500 mg by mouth every 6 (six) hours as needed (pain).  [name] amLODipine (NORVASC) 5 MG tablet Take 1 tablet (5 mg total) by mouth daily.  . benazepril (LOTENSIN) 40 MG tablet Take 1 tablet (40 mg total) by mouth daily.  . Ensure Max Protein (ENSURE MAX PROTEIN) LIQD Take 330 mLs (11 oz total) by mouth 2 (two) times daily between meals. (Patient taking differently: Take 11 oz by mouth daily. )  .  finasteride (PROSCAR) 5 MG tablet Take 1 tablet (5 mg total) by mouth daily. (Patient taking differently: Take 5 mg by mouth at bedtime. )  . metFORMIN (GLUCOPHAGE-XR) 500 MG 24 hr tablet Take 2 tablets (1,000 mg total) by mouth 2 (two) times daily.  . metoprolol tartrate (LOPRESSOR) 50 MG tablet Take 50 mg by mouth 2 (two) times daily.  . pantoprazole (PROTONIX) 40 MG tablet Take 40 mg by mouth at bedtime.   . propafenone (RYTHMOL SR) 425 MG 12 hr capsule Take 425 mg by mouth 2 (two) times daily.   No current facility-administered medications on file prior to visit.     Allergies:  Allergies  Allergen Reactions  . Levaquin [Levofloxacin In D5w] Anaphylaxis and Shortness Of Breath  . Shellfish Allergy Anaphylaxis    Has used duraprep, betadine and ioban in previous surgeries in 2019 and 2018 without issue  . Amiodarone Other (See Comments)    Tremors and thyroid toxicity  . Adhesive [Tape] Other (See Comments)    Little red bumps under the dressing.  He questions whether is latex related    Social History:  Social History    Socioeconomic History  . Marital status: Married    Spouse name: Malachy Mood   . Number of children: 2  . Years of education: Not on file  . Highest education level: High school graduate  Occupational History  . Occupation: retired   Scientific laboratory technician  . Financial resource strain: Not very hard  . Food insecurity    Worry: Never true    Inability: Never true  . Transportation needs    Medical: No    Non-medical: No  Tobacco Use  . Smoking status: Never Smoker  . Smokeless tobacco: Never Used  Substance and Sexual Activity  . Alcohol use: No  . Drug use: No  . Sexual activity: Not on file  Lifestyle  . Physical activity    Days per week: 0 days    Minutes per session: 0 min  . Stress: Not at all  Relationships  . Social connections    Talks on phone: More than three times a week    Gets together: More than three times a week    Attends religious service: More than 4 times per year    Active member of club or organization: No    Attends meetings of clubs or organizations: Never    Relationship status: Married  . Intimate partner violence    Fear of current or ex partner: No    Emotionally abused: No    Physically abused: No    Forced sexual activity: No  Other Topics Concern  . Not on file  Social History Narrative   Married   Gets regular exercise   Social History   Tobacco Use  Smoking Status Never Smoker  Smokeless Tobacco Never Used   Social History   Substance and Sexual Activity  Alcohol Use No    Family History:  Family History  Problem Relation Age of Onset  . Brain cancer Mother   . Kidney disease Neg Hx   . Prostate cancer Neg Hx   . Kidney cancer Neg Hx   . Bladder Cancer Neg Hx     Past medical history, surgical history, medications, allergies, family history and social history reviewed with patient today and changes made to appropriate areas of the chart.   Review of Systems - negative All other ROS negative except what is listed above and  in the HPI.  Objective:    BP 133/82 (BP Location: Left Arm, Cuff Size: Normal)   Pulse (!) 57   Temp 98.1 F (36.7 C) (Oral)   Ht 5' 11.5" (1.816 m)   Wt 192 lb 3.2 oz (87.2 kg)   SpO2 98%   BMI 26.43 kg/m   Wt Readings from Last 3 Encounters:  01/20/19 192 lb 3.2 oz (87.2 kg)  12/19/18 196 lb (88.9 kg)  09/24/18 190 lb (86.2 kg)    Physical Exam Vitals signs and nursing note reviewed.  Constitutional:      General: He is awake. He is not in acute distress.    Appearance: He is well-developed. He is not ill-appearing.  HENT:     Head: Normocephalic and atraumatic.     Right Ear: Hearing, tympanic membrane, ear canal and external ear normal. No drainage.     Left Ear: Hearing, tympanic membrane, ear canal and external ear normal. No drainage.     Nose: Nose normal.     Mouth/Throat:     Pharynx: Uvula midline.  Eyes:     General: Lids are normal.        Right eye: No discharge.        Left eye: No discharge.     Extraocular Movements: Extraocular movements intact.     Conjunctiva/sclera: Conjunctivae normal.     Pupils: Pupils are equal, round, and reactive to light.     Visual Fields: Right eye visual fields normal and left eye visual fields normal.  Neck:     Musculoskeletal: Normal range of motion and neck supple.     Thyroid: No thyromegaly.     Vascular: No carotid bruit.  Cardiovascular:     Rate and Rhythm: Normal rate and regular rhythm.     Heart sounds: Normal heart sounds, S1 normal and S2 normal. No murmur. No gallop.   Pulmonary:     Effort: Pulmonary effort is normal. No accessory muscle usage or respiratory distress.     Breath sounds: Normal breath sounds.  Abdominal:     General: Bowel sounds are normal.     Palpations: Abdomen is soft. There is no hepatomegaly or splenomegaly.     Tenderness: There is no abdominal tenderness.     Hernia: No hernia is present.  Musculoskeletal: Normal range of motion.     Right lower leg: No edema.     Left  lower leg: No edema.  Lymphadenopathy:     Head:     Right side of head: No submental, submandibular, tonsillar, preauricular or posterior auricular adenopathy.     Left side of head: No submental, submandibular, tonsillar, preauricular or posterior auricular adenopathy.     Cervical: No cervical adenopathy.  Skin:    General: Skin is warm and dry.     Capillary Refill: Capillary refill takes less than 2 seconds.     Findings: No rash.  Neurological:     Mental Status: He is alert and oriented to person, place, and time.     Cranial Nerves: Cranial nerves are intact.     Gait: Gait is intact.     Deep Tendon Reflexes: Reflexes are normal and symmetric.     Reflex Scores:      Brachioradialis reflexes are 2+ on the right side and 2+ on the left side.      Patellar reflexes are 2+ on the right side and 2+ on the left side. Psychiatric:        Attention and Perception:  Attention normal.        Mood and Affect: Mood normal.        Speech: Speech normal.        Behavior: Behavior normal. Behavior is cooperative.        Thought Content: Thought content normal.        Judgment: Judgment normal.    Diabetic Foot Exam - Simple   Simple Foot Form Visual Inspection No deformities, no ulcerations, no other skin breakdown bilaterally: Yes Sensation Testing Intact to touch and monofilament testing bilaterally: Yes Pulse Check Posterior Tibialis and Dorsalis pulse intact bilaterally: Yes Comments      6CIT Screen 01/20/2019 08/12/2018 07/27/2017 07/26/2016  What Year? 0 points 0 points 0 points 0 points  What month? 0 points 0 points 0 points 0 points  What time? 0 points 0 points 0 points 0 points  Count back from 20 0 points 0 points 0 points 0 points  Months in reverse 0 points 0 points 0 points 0 points  Repeat phrase 0 points 0 points 0 points 0 points  Total Score 0 0 0 0    Results for orders placed or performed in visit on 01/20/19  Bayer DCA Hb A1c Waived  Result Value Ref  Range   HB A1C (BAYER DCA - WAIVED) 6.4 <7.0 %  Microalbumin, Urine Waived here  Result Value Ref Range   Microalb, Ur Waived 80 (H) 0 - 19 mg/L   Creatinine, Urine Waived 200 10 - 300 mg/dL   Microalb/Creat Ratio 30-300 (H) <30 mg/g      Assessment & Plan:   Problem List Items Addressed This Visit      Cardiovascular and Mediastinum   Hypertension associated with diabetes (HCC)    Chronic, stable with BP at goal for age.  Continue current medication regimen and collaboration with cardiology.  Recommend he continue to check BP at home at least 3 mornings a week and document.  Return in 6 months.      Relevant Orders   CBC with Differential/Platelet out   Comprehensive metabolic panel   TSH   Paroxysmal atrial fibrillation (HCC)    Chronic, ongoing.  Continue current medication regimen and collaboration with cardiology.  Refill on Eliquis sent.  Monitor CBC closely, H/H some mild decline noted 6 months after starting Eliquis.        Endocrine   Type 2 diabetes mellitus with diabetic chronic kidney disease (HCC)    Chronic, ongoing with A1C 6.4% today.  Remains below goal.  Urine ALB 80 and A:C >30-300.  Continue Benazepril for kidney protection and may consider reduction of Metformin in future dependent on kidney function on labs when checked.  Educated him on kidney disease and monitoring.  Consider referral to nephrology in future if ever any worsening of function noted.      Relevant Orders   Bayer DCA Hb A1c Waived (Completed)   Microalbumin, Urine Waived here (Completed)   TSH   Hyperlipidemia associated with type 2 diabetes mellitus (HCC)    Chronic, ongoing.  No current statin, may benefit from this.  Will check CMP and lipid panel today.      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  Continue to monitor closely and  refer to nephrology if decline.  Continue Benazepril for  kidney protection.  Renal dose medications as needed based on labs.      Relevant Orders   Comprehensive metabolic panel   Microalbumin, Urine Waived here (Completed)     Hematopoietic and Hemostatic   Acquired thrombophilia ([place])    Patient on Eliquis, monitor CBC regularly.        Other   Anemia    CBC today, patient on Eliquis.      Failed back surgical syndrome    Continue collaboration with pain clinic providers.       Other Visit Diagnoses    Annual physical exam    -  Primary   Annual labs to include CBC, CMP, TSH, lipid.         Discussed aspirin prophylaxis for myocardial infarction prevention and decision was it was not indicated  LABORATORY TESTING:  Health maintenance labs ordered today as discussed above.   The natural history of prostate cancer and ongoing controversy regarding screening and potential treatment outcomes of prostate cancer has been discussed with the patient. The meaning of a false positive PSA and a false negative PSA has been discussed. He indicates understanding of the limitations of this screening test and wishes not to proceed with screening PSA testing.   IMMUNIZATIONS:   - Tdap: Tetanus vaccination status reviewed: last tetanus booster within 10 years. - Influenza: Up to date - Pneumovax: Up to date - Prevnar: Up to date - Zostavax vaccine: Up to date  SCREENING: - Colonoscopy: Up to date  Discussed with patient purpose of the colonoscopy is to detect colon cancer at curable precancerous or early stages   - AAA Screening: Not applicable  -Hearing Test: Not applicable  -Spirometry: Not applicable   PATIENT COUNSELING:    Sexuality: Discussed sexually transmitted diseases, partner selection, use of condoms, avoidance of unintended pregnancy  and contraceptive alternatives.   Advised to avoid cigarette smoking.  I discussed with the patient that most people either  abstain from alcohol or drink within safe limits (<=14/week and <=4 drinks/occasion for males, <=7/weeks and <= 3 drinks/occasion for females) and that the risk for alcohol disorders and other health effects rises proportionally with the number of drinks per week and how often a drinker exceeds daily limits.  Discussed cessation/primary prevention of drug use and availability of treatment for abuse.   Diet: Encouraged to adjust caloric intake to maintain  or achieve ideal body weight, to reduce intake of dietary saturated fat and total fat, to limit sodium intake by avoiding high sodium foods and not adding table salt, and to maintain adequate dietary potassium and calcium preferably from fresh fruits, vegetables, and low-fat dairy products.    stressed the importance of regular exercise  Injury prevention: Discussed safety belts, safety helmets, smoke detector, smoking near bedding or upholstery.   Dental health: Discussed importance of regular tooth brushing, flossing, and dental visits.   Follow up plan: NEXT PREVENTATIVE PHYSICAL DUE IN 1 YEAR. Return in about 3 months (around 04/20/2019) for T2DM, HTN/HLD, A-fib.

## 2019-01-20 NOTE — Assessment & Plan Note (Signed)
Patient on Eliquis, monitor CBC regularly. 

## 2019-01-20 NOTE — Assessment & Plan Note (Signed)
Chronic, ongoing.  No current statin, may benefit from this.  Will check CMP and lipid panel today.

## 2019-01-20 NOTE — Patient Instructions (Signed)
Carbohydrate Counting for Diabetes Mellitus, Adult  Carbohydrate counting is a method of keeping track of how many carbohydrates you eat. Eating carbohydrates naturally increases the amount of sugar (glucose) in the blood. Counting how many carbohydrates you eat helps keep your blood glucose within normal limits, which helps you manage your diabetes (diabetes mellitus). It is important to know how many carbohydrates you can safely have in each meal. This is different for every person. A diet and nutrition specialist (registered dietitian) can help you make a meal plan and calculate how many carbohydrates you should have at each meal and snack. Carbohydrates are found in the following foods:  Grains, such as breads and cereals.  Dried beans and soy products.  Starchy vegetables, such as potatoes, peas, and corn.  Fruit and fruit juices.  Milk and yogurt.  Sweets and snack foods, such as cake, cookies, candy, chips, and soft drinks. How do I count carbohydrates? There are two ways to count carbohydrates in food. You can use either of the methods or a combination of both. Reading "Nutrition Facts" on packaged food The "Nutrition Facts" list is included on the labels of almost all packaged foods and beverages in the [place] It includes:  The serving size.  Information about nutrients in each serving, including the grams (g) of carbohydrate per serving. To use the "Nutrition Facts":  Decide how many servings you will have.  Multiply the number of servings by the number of carbohydrates per serving.  The resulting number is the total amount of carbohydrates that you will be having. Learning standard serving sizes of other foods When you eat carbohydrate foods that are not packaged or do not include "Nutrition Facts" on the label, you need to measure the servings in order to count the amount of carbohydrates:  Measure the foods that you will eat with a food scale or measuring cup, if needed.   Decide how many standard-size servings you will eat.  Multiply the number of servings by 15. Most carbohydrate-rich foods have about 15 g of carbohydrates per serving. ? For example, if you eat 8 oz (170 g) of strawberries, you will have eaten 2 servings and 30 g of carbohydrates (2 servings x 15 g = 30 g).  For foods that have more than one food mixed, such as soups and casseroles, you must count the carbohydrates in each food that is included. The following list contains standard serving sizes of common carbohydrate-rich foods. Each of these servings has about 15 g of carbohydrates:   hamburger bun or  English muffin.   oz (15 mL) syrup.   oz (14 g) jelly.  1 slice of bread.  1 six-inch tortilla.  3 oz (85 g) cooked rice or pasta.  4 oz (113 g) cooked dried beans.  4 oz (113 g) starchy vegetable, such as peas, corn, or potatoes.  4 oz (113 g) hot cereal.  4 oz (113 g) mashed potatoes or  of a large baked potato.  4 oz (113 g) canned or frozen fruit.  4 oz (120 mL) fruit juice.  4-6 crackers.  6 chicken nuggets.  6 oz (170 g) unsweetened dry cereal.  6 oz (170 g) plain fat-free yogurt or yogurt sweetened with artificial sweeteners.  8 oz (240 mL) milk.  8 oz (170 g) fresh fruit or one small piece of fruit.  24 oz (680 g) popped popcorn. Example of carbohydrate counting Sample meal  3 oz (85 g) chicken breast.  6 oz (170 g)   brown rice.  4 oz (113 g) corn.  8 oz (240 mL) milk.  8 oz (170 g) strawberries with sugar-free whipped topping. Carbohydrate calculation 1. Identify the foods that contain carbohydrates: ? Rice. ? Corn. ? Milk. ? Strawberries. 2. Calculate how many servings you have of each food: ? 2 servings rice. ? 1 serving corn. ? 1 serving milk. ? 1 serving strawberries. 3. Multiply each number of servings by 15 g: ? 2 servings rice x 15 g = 30 g. ? 1 serving corn x 15 g = 15 g. ? 1 serving milk x 15 g = 15 g. ? 1 serving  strawberries x 15 g = 15 g. 4. Add together all of the amounts to find the total grams of carbohydrates eaten: ? 30 g + 15 g + 15 g + 15 g = 75 g of carbohydrates total. Summary  Carbohydrate counting is a method of keeping track of how many carbohydrates you eat.  Eating carbohydrates naturally increases the amount of sugar (glucose) in the blood.  Counting how many carbohydrates you eat helps keep your blood glucose within normal limits, which helps you manage your diabetes.  A diet and nutrition specialist (registered dietitian) can help you make a meal plan and calculate how many carbohydrates you should have at each meal and snack. This information is not intended to replace advice given to you by your health care provider. Make sure you discuss any questions you have with your health care provider. Document Released: 01/30/2005 Document Revised: 08/24/2016 Document Reviewed: 07/14/2015 Elsevier Patient Education  2020 [employer]  

## 2019-01-20 NOTE — Assessment & Plan Note (Signed)
CBC today, patient on Eliquis.

## 2019-01-20 NOTE — Assessment & Plan Note (Signed)
Chronic, ongoing.  Continue to monitor closely and refer to nephrology if decline.  Continue Benazepril for kidney protection.  Renal dose medications as needed based on labs. 

## 2019-01-20 NOTE — Assessment & Plan Note (Signed)
Chronic, stable with BP at goal for age.  Continue current medication regimen and collaboration with cardiology.  Recommend he continue to check BP at home at least 3 mornings a week and document.  Return in 6 months.

## 2019-01-20 NOTE — Telephone Encounter (Signed)
Incoming request from [place] for [employer]

## 2019-01-21 LAB — CBC WITH DIFFERENTIAL/PLATELET
Basophils Absolute: 0 x10E3/uL (ref 0.0–0.2)
Basos: 0 %
EOS (ABSOLUTE): 0 x10E3/uL (ref 0.0–0.4)
Eos: 1 %
Hematocrit: 37.6 % (ref 37.5–51.0)
Hemoglobin: 12.3 g/dL — ABNORMAL LOW (ref 13.0–17.7)
Immature Grans (Abs): 0 x10E3/uL (ref 0.0–0.1)
Immature Granulocytes: 0 %
Lymphocytes Absolute: 1.6 x10E3/uL (ref 0.7–3.1)
Lymphs: 27 %
MCH: 30.4 pg (ref 26.6–33.0)
MCHC: 32.7 g/dL (ref 31.5–35.7)
MCV: 93 fL (ref 79–97)
Monocytes Absolute: 0.6 x10E3/uL (ref 0.1–0.9)
Monocytes: 10 %
Neutrophils Absolute: 3.7 x10E3/uL (ref 1.4–7.0)
Neutrophils: 62 %
Platelets: 192 x10E3/uL (ref 150–450)
RBC: 4.04 x10E6/uL — ABNORMAL LOW (ref 4.14–5.80)
RDW: 13.6 % (ref 11.6–15.4)
WBC: 6 x10E3/uL (ref 3.4–10.8)

## 2019-01-21 LAB — COMPREHENSIVE METABOLIC PANEL WITH GFR
ALT: 11 IU/L (ref 0–44)
AST: 17 IU/L (ref 0–40)
Albumin/Globulin Ratio: 1.5 (ref 1.2–2.2)
Albumin: 4.3 g/dL (ref 3.7–4.7)
Alkaline Phosphatase: 90 IU/L (ref 39–117)
BUN/Creatinine Ratio: 15 (ref 10–24)
BUN: 16 mg/dL (ref 8–27)
Bilirubin Total: 0.3 mg/dL (ref 0.0–1.2)
CO2: 25 mmol/L (ref 20–29)
Calcium: 10.1 mg/dL (ref 8.6–10.2)
Chloride: 102 mmol/L (ref 96–106)
Creatinine, Ser: 1.08 mg/dL (ref 0.76–1.27)
GFR calc Af Amer: 77 mL/min/1.73 (ref >59)
GFR calc non Af Amer: 66 mL/min/1.73 (ref >59)
Globulin, Total: 2.8 g/dL (ref 1.5–4.5)
Glucose: 157 mg/dL — ABNORMAL HIGH (ref 65–99)
Potassium: 4.1 mmol/L (ref 3.5–5.2)
Sodium: 140 mmol/L (ref 134–144)
Total Protein: 7.1 g/dL (ref 6.0–8.5)

## 2019-01-21 LAB — LIPID PANEL W/O CHOL/HDL RATIO
Cholesterol, Total: 157 mg/dL (ref 100–199)
HDL: 47 mg/dL (ref >39)
LDL Chol Calc (NIH): 88 mg/dL (ref 0–99)
Triglycerides: 124 mg/dL (ref 0–149)
VLDL Cholesterol Cal: 22 mg/dL (ref 5–40)

## 2019-01-21 LAB — TSH: TSH: 3.67 u[IU]/mL (ref 0.450–4.500)

## 2019-01-29 DIAGNOSIS — I10 Essential (primary) hypertension: Secondary | ICD-10-CM | POA: Diagnosis not present

## 2019-01-29 DIAGNOSIS — E782 Mixed hyperlipidemia: Secondary | ICD-10-CM | POA: Diagnosis not present

## 2019-01-29 DIAGNOSIS — I480 Paroxysmal atrial fibrillation: Secondary | ICD-10-CM | POA: Diagnosis not present

## 2019-02-03 ENCOUNTER — Other Ambulatory Visit: Payer: Self-pay

## 2019-02-03 ENCOUNTER — Ambulatory Visit (HOSPITAL_BASED_OUTPATIENT_CLINIC_OR_DEPARTMENT_OTHER): Payer: Medicare Other | Admitting: Student in an Organized Health Care Education/Training Program

## 2019-02-03 ENCOUNTER — Encounter: Payer: Self-pay | Admitting: Student in an Organized Health Care Education/Training Program

## 2019-02-03 ENCOUNTER — Ambulatory Visit
Admission: RE | Admit: 2019-02-03 | Discharge: 2019-02-03 | Disposition: A | Discharge status: Home / Self Care | Payer: Medicare Other | Source: Ambulatory Visit | Attending: Student in an Organized Health Care Education/Training Program | Admitting: Student in an Organized Health Care Education/Training Program

## 2019-02-03 VITALS — BP 158/91 | HR 77 | Temp 97.6°F | Resp 20 | Ht 72.0 in | Wt 191.0 lb

## 2019-02-03 DIAGNOSIS — G8929 Other chronic pain: Secondary | ICD-10-CM | POA: Insufficient documentation

## 2019-02-03 DIAGNOSIS — M5416 Radiculopathy, lumbar region: Secondary | ICD-10-CM | POA: Diagnosis not present

## 2019-02-03 MED ORDER — IOHEXOL 180 MG/ML  SOLN
10.0000 mL | Freq: Once | INTRAMUSCULAR | Status: AC
  Administered 2019-02-03: 10 mL via EPIDURAL

## 2019-02-03 MED ORDER — SODIUM CHLORIDE (PF) 0.9 % IJ SOLN
INTRAMUSCULAR | Status: AC
  Filled 2019-02-03: qty 10

## 2019-02-03 MED ORDER — SODIUM CHLORIDE 0.9% FLUSH
2.0000 mL | Freq: Once | INTRAVENOUS | Status: AC
  Administered 2019-02-03: 2 mL

## 2019-02-03 MED ORDER — IOHEXOL 180 MG/ML  SOLN
INTRAMUSCULAR | Status: AC
  Filled 2019-02-03: qty 20

## 2019-02-03 MED ORDER — DEXAMETHASONE SODIUM PHOSPHATE 10 MG/ML IJ SOLN
10.0000 mg | Freq: Once | INTRAMUSCULAR | Status: AC
  Administered 2019-02-03: 10 mg

## 2019-02-03 MED ORDER — ROPIVACAINE HCL 2 MG/ML IJ SOLN
2.0000 mL | Freq: Once | INTRAMUSCULAR | Status: AC
  Administered 2019-02-03: 2 mL via EPIDURAL

## 2019-02-03 MED ORDER — LIDOCAINE HCL 2 % IJ SOLN
20.0000 mL | Freq: Once | INTRAMUSCULAR | Status: AC
  Administered 2019-02-03: 400 mg

## 2019-02-03 MED ORDER — ROPIVACAINE HCL 2 MG/ML IJ SOLN
INTRAMUSCULAR | Status: AC
  Filled 2019-02-03: qty 10

## 2019-02-03 MED ORDER — DEXAMETHASONE SODIUM PHOSPHATE 10 MG/ML IJ SOLN
INTRAMUSCULAR | Status: AC
  Filled 2019-02-03: qty 1

## 2019-02-03 MED ORDER — LIDOCAINE HCL 2 % IJ SOLN
INTRAMUSCULAR | Status: AC
  Filled 2019-02-03: qty 20

## 2019-02-03 NOTE — Progress Notes (Signed)
Patient's Name: Sean Reyes.  MRN: [id number]  Referring Provider: [name], MD  DOB: 24-Jan-1941  PCP: [name], MD  DOS: 02/03/2019  Note by: Gillis Santa, MD  Service setting: Ambulatory outpatient  Specialty: Interventional Pain Management  Patient type: Established  Location: [facility] (AMB) Pain Management Facility  Visit type: Interventional Procedure   Primary Reason for Visit: Interventional Pain Management Treatment. CC: Back Pain  Procedure:          Anesthesia, Analgesia, Anxiolysis:  Type: Diagnostic Epidural Steroid Injection #1  Region: Caudal Level: Sacrococcygeal   Laterality: Midline       Type: Local Anesthesia  Local Anesthetic: Lidocaine 1-2%  Position: Prone   Indications: 1. Chronic radicular lumbar pain    Pain Score: Pre-procedure: 6 /10 Post-procedure: 0-No pain/10   Patient stopped Eliquis 3 days prior to procedure (THRUSDAY).  Pre-op Assessment:  Sean Reyes is a 77 y.o. (year old), male patient, seen today for interventional treatment. He  has a past surgical history that includes Tonsillectomy; Hernia repair; Ablation; Colonoscopy with propofol (N/A, 10/05/2015); Lumbar laminectomy/decompression microdiscectomy (Left, 09/13/2016); Anterior lat lumbar fusion (N/A, 06/27/2017); Joint replacement (Bilateral); Back surgery; Esophagogastroduodenoscopy (egd) with propofol (N/A, 04/01/2018); Cardioversion (N/A, 08/29/2018); and Cardioversion (N/A, 09/24/2018). Sean Reyes has a current medication list which includes the following prescription(s): amlodipine, benazepril, eliquis, ensure max protein, finasteride, metformin, metoprolol tartrate, pantoprazole, propafenone, and acetaminophen. His primarily concern today is the Back Pain  Initial Vital Signs:  Pulse/HCG Rate: 64  Temp: 97.6 F (36.4 C) Resp: 18 BP: (!) 148/81 SpO2: 98 %  BMI: Estimated body mass index is 25.9 kg/m as calculated from the following:   Height as of this encounter: 6' (1.829  m).   Weight as of this encounter: 191 lb (86.6 kg).  Risk Assessment: Allergies: Reviewed. He is allergic to levaquin [levofloxacin in d5w]; shellfish allergy; amiodarone; and adhesive [tape].  Allergy Precautions: None required Coagulopathies: Reviewed. None identified.  Blood-thinner therapy: None at this time Active Infection(s): Reviewed. None identified. Sean Reyes is afebrile  Site Confirmation: Sean Reyes was asked to confirm the procedure and laterality before marking the site Procedure checklist: Completed Consent: Before the procedure and under the influence of no sedative(s), amnesic(s), or anxiolytics, the patient was informed of the treatment options, risks and possible complications. To fulfill our ethical and legal obligations, as recommended by the American Medical Association's Code of Ethics, I have informed the patient of my clinical impression; the nature and purpose of the treatment or procedure; the risks, benefits, and possible complications of the intervention; the alternatives, including doing nothing; the risk(s) and benefit(s) of the alternative treatment(s) or procedure(s); and the risk(s) and benefit(s) of doing nothing. The patient was provided information about the general risks and possible complications associated with the procedure. These may include, but are not limited to: failure to achieve desired goals, infection, bleeding, organ or nerve damage, allergic reactions, paralysis, and death. In addition, the patient was informed of those risks and complications associated to Spine-related procedures, such as failure to decrease pain; infection (i.e.: Meningitis, epidural or intraspinal abscess); bleeding (i.e.: epidural hematoma, subarachnoid hemorrhage, or any other type of intraspinal or peri-dural bleeding); organ or nerve damage (i.e.: Any type of peripheral nerve, nerve root, or spinal cord injury) with subsequent damage to sensory, motor, and/or autonomic  systems, resulting in permanent pain, numbness, and/or weakness of one or several areas of the body; allergic reactions; (i.e.: anaphylactic reaction); and/or death. Furthermore, the patient was informed  of those risks and complications associated with the medications. These include, but are not limited to: allergic reactions (i.e.: anaphylactic or anaphylactoid reaction(s)); adrenal axis suppression; blood sugar elevation that in diabetics may result in ketoacidosis or comma; water retention that in patients with history of congestive heart failure may result in shortness of breath, pulmonary edema, and decompensation with resultant heart failure; weight gain; swelling or edema; medication-induced neural toxicity; particulate matter embolism and blood vessel occlusion with resultant organ, and/or nervous system infarction; and/or aseptic necrosis of one or more joints. Finally, the patient was informed that Medicine is not an exact science; therefore, there is also the possibility of unforeseen or unpredictable risks and/or possible complications that may result in a catastrophic outcome. The patient indicated having understood very clearly. We have given the patient no guarantees and we have made no promises. Enough time was given to the patient to ask questions, all of which were answered to the patient's satisfaction. Sean Reyes has indicated that he wanted to continue with the procedure. Attestation: I, the ordering provider, attest that I have discussed with the patient the benefits, risks, side-effects, alternatives, likelihood of achieving goals, and potential problems during recovery for the procedure that I have provided informed consent. Date  Time: 02/03/2019  9:04 AM  Pre-Procedure Preparation:  Monitoring: As per clinic protocol. Respiration, ETCO2, SpO2, BP, heart rate and rhythm monitor placed and checked for adequate function Safety Precautions: Patient was assessed for positional comfort  and pressure points before starting the procedure. Time-out: I initiated and conducted the "Time-out" before starting the procedure, as per protocol. The patient was asked to participate by confirming the accuracy of the "Time Out" information. Verification of the correct person, site, and procedure were performed and confirmed by me, the nursing staff, and the patient. "Time-out" conducted as per Joint Commission's Universal Protocol (UP.01.01.01). Time: 0956  Description of Procedure:          Target Area: Caudal Epidural Canal. Approach: Midline approach. Area Prepped: Entire Posterior Sacrococcygeal Region Prepping solution: DuraPrep (Iodine Povacrylex [0.7% available iodine] and Isopropyl Alcohol, 74% w/w) Safety Precautions: Aspiration looking for blood return was conducted prior to all injections. At no point did we inject any substances, as a needle was being advanced. No attempts were made at seeking any paresthesias. Safe injection practices and needle disposal techniques used. Medications properly checked for expiration dates. SDV (single dose vial) medications used. Description of the Procedure: Protocol guidelines were followed. The patient was placed in position over the fluoroscopy table. The target area was identified and the area prepped in the usual manner. Skin & deeper tissues infiltrated with local anesthetic. Appropriate amount of time allowed to pass for local anesthetics to take effect. The procedure needles were then advanced to the target area. Proper needle placement secured. Negative aspiration confirmed. Solution injected in intermittent fashion, asking for systemic symptoms every 0.5cc of injectate. The needles were then removed and the area cleansed, making sure to leave some of the prepping solution back to take advantage of its long term bactericidal properties. Vitals:   02/03/19 0913 02/03/19 0950 02/03/19 1000  BP: (!) 148/81 (!) 158/91   Pulse: 64 81 77  Resp: 18  (!) 23 20  Temp: 97.6 F (36.4 C)    TempSrc: Oral    SpO2:  98% 99%  Weight: 191 lb (86.6 kg)    Height: 6' (1.829 m)      Start Time: 0956 hrs. End Time: 1002 hrs. Materials:  Needle(s)  Type: Epidural needle Gauge: 22G Length: 3.5-in Medication(s): Please see orders for medications and dosing details. 6 cc solution made of 4 cc of preservative-free saline, 1 cc of 0.2% ropivacaine, 1 cc of Decadron 10 mg/cc.  Imaging Guidance (Spinal):          Type of Imaging Technique: Fluoroscopy Guidance (Spinal) Indication(s): Assistance in needle guidance and placement for procedures requiring needle placement in or near specific anatomical locations not easily accessible without such assistance. Exposure Time: Please see nurses notes. Contrast: Before injecting any contrast, we confirmed that the patient did not have an allergy to iodine, shellfish, or radiological contrast. Once satisfactory needle placement was completed at the desired level, radiological contrast was injected. Contrast injected under live fluoroscopy. No contrast complications. See chart for type and volume of contrast used. Fluoroscopic Guidance: I was personally present during the use of fluoroscopy. "Tunnel Vision Technique" used to obtain the best possible view of the target area. Parallax error corrected before commencing the procedure. "Direction-depth-direction" technique used to introduce the needle under continuous pulsed fluoroscopy. Once target was reached, antero-posterior, oblique, and lateral fluoroscopic projection used confirm needle placement in all planes. Images permanently stored in EMR. Interpretation: I personally interpreted the imaging intraoperatively. Adequate needle placement confirmed in multiple planes. Appropriate spread of contrast into desired area was observed. No evidence of afferent or efferent intravascular uptake. No intrathecal or subarachnoid spread observed. Permanent images saved into the  patient's record.  Antibiotic Prophylaxis:   Anti-infectives (From admission, onward)   None     Indication(s): None identified  Post-operative Assessment:  Post-procedure Vital Signs:  Pulse/HCG Rate: 77  Temp: 97.6 F (36.4 C) Resp: 20 BP: (!) 158/91 SpO2: 99 %  EBL: None  Complications: No immediate post-treatment complications observed by team, or reported by patient.  Note: The patient tolerated the entire procedure well. A repeat set of vitals were taken after the procedure and the patient was kept under observation following institutional policy, for this type of procedure. Post-procedural neurological assessment was performed, showing return to baseline, prior to discharge. The patient was provided with post-procedure discharge instructions, including a section on how to identify potential problems. Should any problems arise concerning this procedure, the patient was given instructions to immediately contact us, at any time, without hesitation. In any case, we plan to contact the patient by telephone for a follow-up status report regarding this interventional procedure.  Comments:  No additional relevant information.  Patient instructed to start Eliquis tomorrow so long as he is not having any lower extremity weakness. Plan of Care  Orders:  Orders Placed This Encounter  Procedures  . Fluoro (C-Arm) (<60 min) (No Report)    Intraoperative interpretation by procedural physician at [facility].    Standing Status:   Standing    Number of Occurrences:   1    Order Specific Question:   Reason for exam:    Answer:   Assistance in needle guidance and placement for procedures requiring needle placement in or near specific anatomical locations not easily accessible without such assistance.    Medications ordered for procedure: Meds ordered this encounter  Medications  . iohexol (OMNIPAQUE) 180 MG/ML injection 10 mL    Must be Myelogram-compatible. If not  available, you may substitute with a water-soluble, non-ionic, hypoallergenic, myelogram-compatible radiological contrast medium.  [name] lidocaine (XYLOCAINE) 2 % (with pres) injection 400 mg  . ropivacaine (PF) 2 mg/mL (0.2%) (NAROPIN) injection 2 mL  . sodium chloride flush (NS) 0.9 %  injection 2 mL  . dexamethasone (DECADRON) injection 10 mg   Medications administered: We administered iohexol, lidocaine, ropivacaine (PF) 2 mg/mL (0.2%), sodium chloride flush, and dexamethasone.  See the medical record for exact dosing, route, and time of administration.  Follow-up plan:   Return in about 4 weeks (around 03/03/2019) for Post Procedure Evaluation, virtual.      Status post caudal 02/03/2019: 6 cc injected.   Recent Visits Date Type Provider Dept  01/16/19 Office Visit Gillis Santa, MD [facility]  Showing recent visits within past 90 days and meeting all other requirements   Today's Visits Date Type Provider Dept  02/03/19 Procedure visit Gillis Santa, MD [facility]  Showing today's visits and meeting all other requirements   Future Appointments Date Type Provider Dept  03/03/19 Appointment Gillis Santa, MD [facility]  Showing future appointments within next 90 days and meeting all other requirements   Disposition: Discharge home  Discharge Date & Time: 02/03/2019; 1011 hrs.   Primary Care Physician: [name], MD Location: [facility] Note by: Gillis Santa, MD Date: 02/03/2019; Time: 10:23 AM  Disclaimer:  Medicine is not an exact science. The only guarantee in medicine is that nothing is guaranteed. It is important to note that the decision to proceed with this intervention was based on the information collected from the patient. The Data and conclusions were drawn from the patient's questionnaire, the interview, and the physical examination. Because the information was provided in large part by the patient, it  cannot be guaranteed that it has not been purposely or unconsciously manipulated. Every effort has been made to obtain as much relevant data as possible for this evaluation. It is important to note that the conclusions that lead to this procedure are derived in large part from the available data. Always take into account that the treatment will also be dependent on availability of resources and existing treatment guidelines, considered by other Pain Management Practitioners as being common knowledge and practice, at the time of the intervention. For Medico-Legal purposes, it is also important to point out that variation in procedural techniques and pharmacological choices are the acceptable norm. The indications, contraindications, technique, and results of the above procedure should only be interpreted and judged by a Board-Certified Interventional Pain Specialist with extensive familiarity and expertise in the same exact procedure and technique.

## 2019-02-03 NOTE — Patient Instructions (Addendum)
____________________________________________________________________________________________  Post-Procedure Discharge Instructions  START YOUR ELIQUS TOMORROW  Instructions:  Apply ice:   Purpose: This will minimize any swelling and discomfort after procedure.   When: Day of procedure, as soon as you get home.  How: Fill a plastic sandwich bag with crushed ice. Cover it with a small towel and apply to injection site.  How long: (15 min on, 15 min off) Apply for 15 minutes then remove x 15 minutes.  Repeat sequence on day of procedure, until you go to bed.  Apply heat:   Purpose: To treat any soreness and discomfort from the procedure.  When: Starting the next day after the procedure.  How: Apply heat to procedure site starting the day following the procedure.  How long: May continue to repeat daily, until discomfort goes away.  Food intake: Start with clear liquids (like water) and advance to regular food, as tolerated.   Physical activities: Keep activities to a minimum for the first 8 hours after the procedure. After that, then as tolerated.  Driving: If you have received any sedation, be responsible and do not drive. You are not allowed to drive for 24 hours after having sedation.  Blood thinner: (Applies only to those taking blood thinners) You may restart your blood thinner 6 hours after your procedure.  Insulin: (Applies only to Diabetic patients taking insulin) As soon as you can eat, you may resume your normal dosing schedule.  Infection prevention: Keep procedure site clean and dry. Shower daily and clean area with soap and water.  Post-procedure Pain Diary: Extremely important that this be done correctly and accurately. Recorded information will be used to determine the next step in treatment. For the purpose of accuracy, follow these rules:  Evaluate only the area treated. Do not report or include pain from an untreated area. For the purpose of this evaluation,  ignore all other areas of pain, except for the treated area.  After your procedure, avoid taking a long nap and attempting to complete the pain diary after you wake up. Instead, set your alarm clock to go off every hour, on the hour, for the initial 8 hours after the procedure. Document the duration of the numbing medicine, and the relief you are getting from it.  Do not go to sleep and attempt to complete it later. It will not be accurate. If you received sedation, it is likely that you were given a medication that may cause amnesia. Because of this, completing the diary at a later time may cause the information to be inaccurate. This information is needed to plan your care.  Follow-up appointment: Keep your post-procedure follow-up evaluation appointment after the procedure (usually 2 weeks for most procedures, 6 weeks for radiofrequencies). DO NOT FORGET to bring you pain diary with you.   Expect: (What should I expect to see with my procedure?)  From numbing medicine (AKA: Local Anesthetics): Numbness or decrease in pain. You may also experience some weakness, which if present, could last for the duration of the local anesthetic.  Onset: Full effect within 15 minutes of injected.  Duration: It will depend on the type of local anesthetic used. On the average, 1 to 8 hours.   From steroids (Applies only if steroids were used): Decrease in swelling or inflammation. Once inflammation is improved, relief of the pain will follow.  Onset of benefits: Depends on the amount of swelling present. The more swelling, the longer it will take for the benefits to be seen. In some  cases, up to 10 days.  Duration: Steroids will stay in the system x 2 weeks. Duration of benefits will depend on multiple posibilities including persistent irritating factors.  Side-effects: If present, they may typically last 2 weeks (the duration of the steroids).  Frequent: Cramps (if they occur, drink Gatorade and take  over-the-counter Magnesium 450-500 mg once to twice a day); water retention with temporary weight gain; increases in blood sugar; decreased immune system response; increased appetite.  Occasional: Facial flushing (red, warm cheeks); mood swings; menstrual changes.  Uncommon: Long-term decrease or suppression of natural hormones; bone thinning. (These are more common with higher doses or more frequent use. This is why we prefer that our patients avoid having any injection therapies in other practices.)   Very Rare: Severe mood changes; psychosis; aseptic necrosis.  From procedure: Some discomfort is to be expected once the numbing medicine wears off. This should be minimal if ice and heat are applied as instructed.  Call if: (When should I call?)  You experience numbness and weakness that gets worse with time, as opposed to wearing off.  New onset bowel or bladder incontinence. (Applies only to procedures done in the spine)  Emergency Numbers:  Durning business hours (Monday - Thursday, 8:00 AM - 4:00 PM) (Friday, 9:00 AM - 12:00 Noon): [phone]  After hours: [phone]  NOTE: If you are having a problem and are unable connect with, or to talk to a provider, then go to your nearest urgent care or emergency department. If the problem is serious and urgent, please call 911. ____________________________________________________________________________________________   Epidural Steroid Injection An epidural steroid injection is a shot of steroid medicine and numbing medicine that is given into the space between the spinal cord and the bones in your back (epidural space). The shot helps relieve pain caused by an irritated or swollen nerve root. The amount of pain relief you get from the injection depends on what is causing the nerve to be swollen and irritated, and how long your pain lasts. You are more likely to benefit from this injection if your pain is strong and comes on suddenly  rather than if you have had pain for a long time. Tell a health care provider about:   Any allergies you have.  All medicines you are taking, including vitamins, herbs, eye drops, creams, and over-the-counter medicines.  Any problems you or family members have had with anesthetic medicines.  Any blood disorders you have.  Any surgeries you have had.  Any medical conditions you have.  Whether you are pregnant or may be pregnant. What are the risks? Generally, this is a safe procedure. However, problems may occur, including:  Headache.  Bleeding.  Infection.  Allergic reaction to medicines.  Damage to your nerves. What happens before the procedure? Staying hydrated Follow instructions from your health care provider about hydration, which may include:  Up to 2 hours before the procedure - you may continue to drink clear liquids, such as water, clear fruit juice, black coffee, and plain tea. Eating and drinking restrictions Follow instructions from your health care provider about eating and drinking, which may include:  8 hours before the procedure - stop eating heavy meals or foods such as meat, fried foods, or fatty foods.  6 hours before the procedure - stop eating light meals or foods, such as toast or cereal.  6 hours before the procedure - stop drinking milk or drinks that contain milk.  2 hours before the procedure -  stop drinking clear liquids. Medicine  You may be given medicines to lower anxiety.  Ask your health care provider about: ? Changing or stopping your regular medicines. This is especially important if you are taking diabetes medicines or blood thinners. ? Taking medicines such as aspirin and ibuprofen. These medicines can thin your blood. Do not take these medicines before your procedure if your health care provider instructs you not to. General instructions  Plan to have someone take you home from the hospital or clinic. What happens during the  procedure?  You may receive a medicine to help you relax (sedative).  You will be asked to lie on your abdomen.  The injection site will be cleaned.  A numbing medicine (local anesthetic) will be used to numb the injection site.  A needle will be inserted through your skin into the epidural space. You may feel some discomfort when this happens. An X-ray machine will be used to make sure the needle is put as close as possible to the affected nerve.  A steroid medicine and a local anesthetic will be injected into the epidural space.  The needle will be removed.  A bandage (dressing) will be put over the injection site. What happens after the procedure?  Your blood pressure, heart rate, breathing rate, and blood oxygen level will be monitored until the medicines you were given have worn off.  Your arm or leg may feel weak or numb for a few hours.  The injection site may feel sore.  Do not drive for 24 hours if you received a sedative. This information is not intended to replace advice given to you by your health care provider. Make sure you discuss any questions you have with your health care provider. Document Released: 05/09/2007 Document Revised: 01/12/2017 Document Reviewed: 05/18/2015 Elsevier Patient Education  2020 [employer].

## 2019-02-05 ENCOUNTER — Other Ambulatory Visit: Payer: Self-pay

## 2019-02-05 ENCOUNTER — Telehealth: Payer: Self-pay | Admitting: Student in an Organized Health Care Education/Training Program

## 2019-02-05 MED ORDER — AMLODIPINE BESYLATE 5 MG PO TABS: 5.0000 mg | ORAL_TABLET | Freq: Every day | ORAL | 12 refills | Status: DC

## 2019-02-05 NOTE — Telephone Encounter (Signed)
Patient last seen 01/20/19 and has appointment 04/21/19.

## 2019-02-05 NOTE — Telephone Encounter (Signed)
Patient had procedure but seems to be having more pain, would like to see if this is normal. He missed Nurse call after the procedure.

## 2019-02-05 NOTE — Telephone Encounter (Signed)
Called patient back and explained to him that he needs to let the steroid work. If he needed to he could always go to the ED.

## 2019-02-06 DIAGNOSIS — R197 Diarrhea, unspecified: Secondary | ICD-10-CM | POA: Diagnosis not present

## 2019-02-06 DIAGNOSIS — Z8619 Personal history of other infectious and parasitic diseases: Secondary | ICD-10-CM | POA: Diagnosis not present

## 2019-02-17 ENCOUNTER — Encounter: Payer: Self-pay | Admitting: Student in an Organized Health Care Education/Training Program

## 2019-02-17 ENCOUNTER — Telehealth: Payer: Self-pay

## 2019-02-17 NOTE — Telephone Encounter (Signed)
Left patient a message to call us so that we can go over the pre virtual appointment questions.

## 2019-02-18 ENCOUNTER — Encounter: Payer: Self-pay | Admitting: Student in an Organized Health Care Education/Training Program

## 2019-02-18 ENCOUNTER — Ambulatory Visit
Payer: Medicare Other | Attending: Student in an Organized Health Care Education/Training Program | Admitting: Student in an Organized Health Care Education/Training Program

## 2019-02-18 ENCOUNTER — Other Ambulatory Visit: Payer: Self-pay

## 2019-02-18 DIAGNOSIS — Z981 Arthrodesis status: Secondary | ICD-10-CM | POA: Diagnosis not present

## 2019-02-18 DIAGNOSIS — M48062 Spinal stenosis, lumbar region with neurogenic claudication: Secondary | ICD-10-CM

## 2019-02-18 DIAGNOSIS — M961 Postlaminectomy syndrome, not elsewhere classified: Secondary | ICD-10-CM | POA: Diagnosis not present

## 2019-02-18 DIAGNOSIS — G8929 Other chronic pain: Secondary | ICD-10-CM

## 2019-02-18 DIAGNOSIS — M5416 Radiculopathy, lumbar region: Secondary | ICD-10-CM | POA: Diagnosis not present

## 2019-02-18 MED ORDER — TRAMADOL HCL 50 MG PO TABS: 50.0000 mg | ORAL_TABLET | Freq: Two times a day (BID) | ORAL | 2 refills | Status: AC | PRN

## 2019-02-18 NOTE — Progress Notes (Signed)
Virtual Encounter - Pain Management PROVIDER NOTE: Information contained herein reflects review and annotations entered in association with encounter. Interpretation of such information and data should be left to medically-trained personnel. Information provided to patient can be located elsewhere in the medical record under "Patient Instructions". Document created using STT-dictation technology, any transcriptional errors that may result from process are unintentional.    Contact & Pharmacy Preferred: [phone] Home: [phone] (home) Mobile: [phone] (mobile) E-mail: [email or web address]  [facility] - [place], [place] - [address] [place] [address] Phone: [phone] Fax: [place], [place] - [address] [place] [address] Phone: [phone] Fax: [phone]   Pre-screening  Sean Reyes offered "in-person" vs "virtual" encounter. He indicated preferring virtual for this encounter.   Reason COVID-19*  Social distancing based on CDC and AMA recommendations.   I contacted Sean Reyes. on 02/18/2019 via telephone.      I clearly identified myself as Sean Santa, MD. I verified that I was speaking with the correct person using two identifiers (Name: Sean Reyes., and date of birth: 05/30/1940).   This visit was completed via telephone due to the restrictions of the COVID-19 pandemic. All issues as above were discussed and addressed but no physical exam was performed. If it was felt that the patient should be evaluated in the office, they were directed there. The patient verbally consented to this visit. Patient was unable to complete an audio/visual visit due to Technical difficulties and/or Lack of internet. Due to the catastrophic nature of the COVID-19 pandemic, this visit was done through audio contact only.  Location of the patient: home address (see Epic for details)  Location of the  provider: office Consent I sought verbal advanced consent from Sean Reyes. for virtual visit interactions. I informed Sean Reyes of possible security and privacy concerns, risks, and limitations associated with providing "not-in-person" medical evaluation and management services. I also informed Sean Reyes of the availability of "in-person" appointments. Finally, I informed him that there would be a charge for the virtual visit and that he could be  personally, fully or partially, financially responsible for it. Sean Reyes expressed understanding and agreed to proceed.   Historic Elements   [name]. is a 79 y.o. year old, male patient evaluated today after his last encounter by our practice on 02/17/2019. Sean Reyes  has a past medical history of Anemia, Anxiety, Arthritis, Atrial fibrillation ([place]), Cataracts, bilateral, Complication of anesthesia, Depression, Diabetes ([place]), Dysrhythmia, GERD (gastroesophageal reflux disease), History of kidney stones, HOH (hard of hearing), Hyperlipidemia, Hypertension, Nocturia, S/P ablation of atrial fibrillation, Sleep apnea, and Tachycardia, unspecified. He also  has a past surgical history that includes Tonsillectomy; Hernia repair; Ablation; Colonoscopy with propofol (N/A, 10/05/2015); Lumbar laminectomy/decompression microdiscectomy (Left, 09/13/2016); Anterior lat lumbar fusion (N/A, 06/27/2017); Joint replacement (Bilateral); Back surgery; Esophagogastroduodenoscopy (egd) with propofol (N/A, 04/01/2018); Cardioversion (N/A, 08/29/2018); and Cardioversion (N/A, 09/24/2018). Sean Reyes has a current medication list which includes the following prescription(s): acetaminophen, amlodipine, benazepril, eliquis, ensure max protein, finasteride, metformin, metoprolol tartrate, pantoprazole, propafenone, and tramadol. He  reports that he has never smoked. He has never used smokeless tobacco. He reports that he does not drink alcohol or use drugs. Sean Reyes is allergic to  levaquin [levofloxacin in d5w]; shellfish allergy; amiodarone; and adhesive [tape].   HPI  Today, he is being contacted for a post-procedure assessment.   Evaluation  of last interventional procedure  02/05/2019 Procedure: caudal injection   Sedation: Please see nurses note for DOS. When no sedatives are used, the analgesic levels obtained are directly associated to the effectiveness of the local anesthetics. However, when sedation is provided, the level of analgesia obtained during the initial 1 hour following the intervention, is believed to be the result of a combination of factors. These factors may include, but are not limited to: 1. The effectiveness of the local anesthetics used. 2. The effects of the analgesic(s) and/or anxiolytic(s) used. 3. The degree of discomfort experienced by the patient at the time of the procedure. 4. The patients ability and reliability in recalling and recording the events. 5. The presence and influence of possible secondary gains and/or psychosocial factors. Reported result: Relief experienced during the 1st hour after the procedure: 0 % (Ultra-Short Term Relief)              Effects of local anesthetic: The analgesic effects attained during this period are directly associated to the localized infiltration of local anesthetics and therefore cary significant diagnostic value as to the etiological location, or anatomical origin, of the pain. Expected duration of relief is directly dependent on the pharmacodynamics of the local anesthetic used. Long-acting (4-6 hours) anesthetics used.  Reported result: Relief during the next 4 to 6 hour after the procedure: 0 % (Short-Term Relief)             Long-term benefit: Defined as the period of time past the expected duration of local anesthetics (1 hour for short-acting and 4-6 hours for long-acting). With the possible exception of prolonged sympathetic blockade from the local anesthetics, benefits during this period are  typically attributed to, or associated with, other factors such as analgesic sensory neuropraxia, antiinflammatory effects, or beneficial biochemical changes provided by agents other than the local anesthetics.  Reported result: Extended relief following procedure: 0 % (Long-Term Relief)             Laboratory Chemistry Profile (12 mo)  Renal: 01/20/2019: BUN 16; BUN/Creatinine Ratio 15; Creatinine, Ser 1.08  Lab Results  Component Value Date   GFRAA 77 01/20/2019   GFRNONAA 66 01/20/2019   Hepatic: 01/20/2019: Albumin 4.3 Lab Results  Component Value Date   AST 17 01/20/2019   ALT 11 01/20/2019   Other: No results found for requested labs within last 8760 hours. Note: Above Lab results reviewed.  Imaging  Fluoro (C-Arm) (<60 min) (No Report) Fluoro was used, but no Radiologist interpretation will be provided.  Please refer to "NOTES" tab for provider progress note.   Assessment  The primary encounter diagnosis was Failed back surgical syndrome. Diagnoses of Postlaminectomy syndrome, lumbar region, Chronic radicular lumbar pain, History of fusion of lumbar spine (L2-L5), Spinal stenosis, lumbar region, with neurogenic claudication, and Lumbar radiculopathy were also pertinent to this visit.  Plan of Care   I am having Sean Reyes. start on traMADol. I am also having him maintain his propafenone, pantoprazole, metFORMIN, finasteride, Ensure Max Protein, acetaminophen, metoprolol tartrate, benazepril, Eliquis, and amLODipine.  Virtual follow-up today status post caudal epidural steroid injection which unfortunately did not provide the patient with much benefit.  At the time of the caudal epidural steroid injection, I evaluated the patient's T12-L1 and L1-L2 interlaminar windows.  They are collapsed with significant facet arthrosis which would preclude a safe percutaneous spinal cord stimulator trial in my opinion.  Patient has an extensive L2-S1 fusion and this is sometimes seen where  adjacent levels  have accelerated degeneration and facet arthropathy resulting in collapsed interlaminar windows.  Given such findings, I will refer the patient to Dr. Mearl Latin at [facility] for consideration of spinal cord stimulator trial.  Will provide prescription for tramadol as below for the patient can take as needed when he has severe breakthrough pain.  I also spoke with the patient's daughter regarding the treatment plan and she was in agreement.   Pharmacotherapy (Medications Ordered): Meds ordered this encounter  Medications  . traMADol (ULTRAM) 50 MG tablet    Sig: Take 1 tablet (50 mg total) by mouth every 12 (twelve) hours as needed for severe pain.    Dispense:  60 tablet    Refill:  2    Fill one day early if pharmacy is closed on scheduled refill date. For chronic pain   Orders:  Orders Placed This Encounter  Procedures  . [facility]    Referral Priority:   Routine    Referral Type:   Surgical    Referral Reason:   Specialty Services Required    Referred to Provider:   Sarina Ill, MD    Requested Specialty:   Neurosurgery    Number of Visits Requested:   1   Follow-up plan:   Return if symptoms worsen or fail to improve.     Status post caudal 02/03/2019: 6 cc injected- not helpful, referral to Dr Mearl Latin at [facility] for [facility] trial/implant .    Recent Visits Date Type Provider Dept  02/03/19 Procedure visit Sean Santa, MD [facility]  01/16/19 Office Visit Sean Santa, MD [facility]  Showing recent visits within past 90 days and meeting all other requirements   Today's Visits Date Type Provider Dept  02/18/19 Office Visit Sean Santa, MD [facility]  Showing today's visits and meeting all other requirements   Future Appointments Date Type Provider Dept  03/03/19 Appointment Sean Santa, MD [facility]  Showing future appointments within next 90 days and meeting all other requirements   I discussed the  assessment and treatment plan with the patient. The patient was provided an opportunity to ask questions and all were answered. The patient agreed with the plan and demonstrated an understanding of the instructions.  Patient advised to call back or seek an in-person evaluation if the symptoms or condition worsens.  Total duration of non-face-to-face encounter: 23 minutes.  Note by: Sean Santa, MD Date: 02/18/2019; Time: 11:58 AM

## 2019-02-21 ENCOUNTER — Telehealth: Payer: Self-pay

## 2019-02-27 ENCOUNTER — Encounter: Payer: Self-pay | Admitting: Student in an Organized Health Care Education/Training Program

## 2019-02-27 DIAGNOSIS — R1310 Dysphagia, unspecified: Secondary | ICD-10-CM | POA: Diagnosis not present

## 2019-02-27 DIAGNOSIS — R197 Diarrhea, unspecified: Secondary | ICD-10-CM | POA: Diagnosis not present

## 2019-02-27 DIAGNOSIS — K2100 Gastro-esophageal reflux disease with esophagitis, without bleeding: Secondary | ICD-10-CM | POA: Diagnosis not present

## 2019-02-27 DIAGNOSIS — Z8619 Personal history of other infectious and parasitic diseases: Secondary | ICD-10-CM | POA: Diagnosis not present

## 2019-03-03 ENCOUNTER — Other Ambulatory Visit: Payer: Self-pay

## 2019-03-03 ENCOUNTER — Ambulatory Visit (HOSPITAL_BASED_OUTPATIENT_CLINIC_OR_DEPARTMENT_OTHER): Payer: Medicare Other | Admitting: Student in an Organized Health Care Education/Training Program

## 2019-03-03 DIAGNOSIS — M961 Postlaminectomy syndrome, not elsewhere classified: Secondary | ICD-10-CM

## 2019-03-03 NOTE — Progress Notes (Signed)
Cancelled visit

## 2019-03-05 ENCOUNTER — Other Ambulatory Visit: Payer: Self-pay

## 2019-03-05 ENCOUNTER — Encounter: Payer: Self-pay | Admitting: Family Medicine

## 2019-03-05 ENCOUNTER — Ambulatory Visit (INDEPENDENT_AMBULATORY_CARE_PROVIDER_SITE_OTHER): Payer: Medicare Other | Admitting: Family Medicine

## 2019-03-05 VITALS — BP 136/88 | HR 57 | Temp 97.9°F | Wt 194.0 lb

## 2019-03-05 DIAGNOSIS — R220 Localized swelling, mass and lump, head: Secondary | ICD-10-CM

## 2019-03-05 DIAGNOSIS — R221 Localized swelling, mass and lump, neck: Secondary | ICD-10-CM | POA: Diagnosis not present

## 2019-03-05 NOTE — Progress Notes (Signed)
BP 136/88   Pulse (!) 57   Temp 97.9 F (36.6 C) (Oral)   Wt 194 lb (88 kg)   SpO2 99%   BMI 26.31 kg/m    Subjective:    Patient ID: Sean Reyes., male    DOB: 15-Jul-1940, 79 y.o.   MRN: [id number]  HPI: Sean Reyes. is a 80 y.o. male  Chief Complaint  Patient presents with  . Referral    for ENT. pt states the GI doctor recomended it   Constantly feels like something is in his throat on the right side . States the other day the area was painful but typically just feels like something is there.  This has been ongoing for a year. GI provider recommended referral to ENT. No dysphagia, SOB, weight loss, appetite changes recently.   Upper lip swells at night every once in a while, maybe once a month. This has been ongoing for years and not seeming to tie to any certain foods or exposures. Denies any issues breathing during spells and they are always spontaneously resolved by morning.   Relevant past medical, surgical, family and social history reviewed and updated as indicated. Interim medical history since our last visit reviewed. Allergies and medications reviewed and updated.  Review of Systems  Per HPI unless specifically indicated above     Objective:    BP 136/88   Pulse (!) 57   Temp 97.9 F (36.6 C) (Oral)   Wt 194 lb (88 kg)   SpO2 99%   BMI 26.31 kg/m   Wt Readings from Last 3 Encounters:  03/05/19 194 lb (88 kg)  02/03/19 191 lb (86.6 kg)  01/20/19 192 lb 3.2 oz (87.2 kg)    Physical Exam Vitals and nursing note reviewed.  Constitutional:      Appearance: Normal appearance.  HENT:     Head: Atraumatic.     Nose: Nose normal.     Mouth/Throat:     Mouth: Mucous membranes are moist.     Pharynx: Oropharynx is clear. No posterior oropharyngeal erythema.  Eyes:     Extraocular Movements: Extraocular movements intact.     Conjunctiva/sclera: Conjunctivae normal.  Neck:     Comments: No palpable thyromegaly Cardiovascular:     Rate and  Rhythm: Normal rate and regular rhythm.  Pulmonary:     Effort: Pulmonary effort is normal.     Breath sounds: Normal breath sounds.  Abdominal:     General: Bowel sounds are normal.     Palpations: Abdomen is soft.     Tenderness: There is no abdominal tenderness. There is no guarding.  Musculoskeletal:        General: Normal range of motion.     Cervical back: Normal range of motion and neck supple.  Skin:    General: Skin is warm and dry.  Neurological:     General: No focal deficit present.     Mental Status: He is oriented to person, place, and time.  Psychiatric:        Mood and Affect: Mood normal.        Thought Content: Thought content normal.        Judgment: Judgment normal.     Results for orders placed or performed in visit on 01/20/19  Bayer DCA Hb A1c Waived  Result Value Ref Range   HB A1C (BAYER DCA - WAIVED) 6.4 <7.0 %  CBC with Differential/Platelet out  Result Value Ref Range   WBC 6.0  3.4 - 10.8 x10E3/uL   RBC 4.04 (L) 4.14 - 5.80 x10E6/uL   Hemoglobin 12.3 (L) 13.0 - 17.7 g/dL   Hematocrit 37.6 37.5 - 51.0 %   MCV 93 79 - 97 fL   MCH 30.4 26.6 - 33.0 pg   MCHC 32.7 31.5 - 35.7 g/dL   RDW 13.6 11.6 - 15.4 %   Platelets 192 150 - 450 x10E3/uL   Neutrophils 62 Not Estab. %   Lymphs 27 Not Estab. %   Monocytes 10 Not Estab. %   Eos 1 Not Estab. %   Basos 0 Not Estab. %   Neutrophils Absolute 3.7 1.4 - 7.0 x10E3/uL   Lymphocytes Absolute 1.6 0.7 - 3.1 x10E3/uL   Monocytes Absolute 0.6 0.1 - 0.9 x10E3/uL   EOS (ABSOLUTE) 0.0 0.0 - 0.4 x10E3/uL   Basophils Absolute 0.0 0.0 - 0.2 x10E3/uL   Immature Granulocytes 0 Not Estab. %   Immature Grans (Abs) 0.0 0.0 - 0.1 x10E3/uL  Comprehensive metabolic panel  Result Value Ref Range   Glucose 157 (H) 65 - 99 mg/dL   BUN 16 8 - 27 mg/dL   Creatinine, Ser 1.08 0.76 - 1.27 mg/dL   GFR calc non Af Amer 66 >59 mL/min/1.73   GFR calc Af Amer 77 >59 mL/min/1.73   BUN/Creatinine Ratio 15 10 - 24   Sodium 140  134 - 144 mmol/L   Potassium 4.1 3.5 - 5.2 mmol/L   Chloride 102 96 - 106 mmol/L   CO2 25 20 - 29 mmol/L   Calcium 10.1 8.6 - 10.2 mg/dL   Total Protein 7.1 6.0 - 8.5 g/dL   Albumin 4.3 3.7 - 4.7 g/dL   Globulin, Total 2.8 1.5 - 4.5 g/dL   Albumin/Globulin Ratio 1.5 1.2 - 2.2   Bilirubin Total 0.3 0.0 - 1.2 mg/dL   Alkaline Phosphatase 90 39 - 117 IU/L   AST 17 0 - 40 IU/L   ALT 11 0 - 44 IU/L  Lipid Panel w/o Chol/HDL Ratio out  Result Value Ref Range   Cholesterol, Total 157 100 - 199 mg/dL   Triglycerides 124 0 - 149 mg/dL   HDL 47 >39 mg/dL   VLDL Cholesterol Cal 22 5 - 40 mg/dL   LDL Chol Calc (NIH) 88 0 - 99 mg/dL  Microalbumin, Urine Waived here  Result Value Ref Range   Microalb, Ur Waived 80 (H) 0 - 19 mg/L   Creatinine, Urine Waived 200 10 - 300 mg/dL   Microalb/Creat Ratio 30-300 (H) <30 mg/g  TSH  Result Value Ref Range   TSH 3.670 0.450 - 4.500 uIU/mL      Assessment & Plan:   Problem List Items Addressed This Visit    None    Visit Diagnoses    Neck fullness    -  Primary   Referral placed to ENT, trial flonase and antihistamines in meantime. Continue protonix in case reflux related.    Relevant Orders   Ambulatory referral to ENT   Lip swelling       Rare, mild. Unclear if ACEI related or something he may be eating or coming into contact with. He declines switching off ACEI. Monitor exposures if recurring       Follow up plan: Return if symptoms worsen or fail to improve.

## 2019-03-06 ENCOUNTER — Telehealth: Payer: Self-pay | Admitting: Family Medicine

## 2019-03-06 MED ORDER — CETIRIZINE HCL 10 MG PO TABS: 10.0000 mg | ORAL_TABLET | Freq: Every day | ORAL | 5 refills | Status: DC

## 2019-03-06 MED ORDER — FLUTICASONE PROPIONATE 50 MCG/ACT NA SUSP: 2.0000 | Freq: Two times a day (BID) | NASAL | 6 refills | Status: DC

## 2019-03-06 NOTE — Telephone Encounter (Signed)
Called pt and let him know that rx has been sent pt understood

## 2019-03-06 NOTE — Telephone Encounter (Signed)
Copied from [place] [id number]. Topic: General - Other >> Mar 06, 2019 11:16 AM Keene Breath wrote: Reason for CRM: Patient called to remind the doctor to send over a script for the nose spray and he said another medication that he couldn't recall.  Patient stated that the pharmacy did not have the medication

## 2019-03-06 NOTE — Telephone Encounter (Signed)
Looks like the medications had pended rather than sent. They should be over there now

## 2019-03-21 DIAGNOSIS — G8929 Other chronic pain: Secondary | ICD-10-CM | POA: Diagnosis not present

## 2019-03-21 DIAGNOSIS — M5416 Radiculopathy, lumbar region: Secondary | ICD-10-CM | POA: Diagnosis not present

## 2019-03-25 DIAGNOSIS — M5416 Radiculopathy, lumbar region: Secondary | ICD-10-CM | POA: Diagnosis not present

## 2019-03-25 DIAGNOSIS — Z01818 Encounter for other preprocedural examination: Secondary | ICD-10-CM | POA: Diagnosis not present

## 2019-03-25 DIAGNOSIS — M47814 Spondylosis without myelopathy or radiculopathy, thoracic region: Secondary | ICD-10-CM | POA: Diagnosis not present

## 2019-03-25 DIAGNOSIS — G8929 Other chronic pain: Secondary | ICD-10-CM | POA: Diagnosis not present

## 2019-04-11 ENCOUNTER — Telehealth: Payer: Medicare Other | Admitting: General Practice

## 2019-04-11 ENCOUNTER — Ambulatory Visit (INDEPENDENT_AMBULATORY_CARE_PROVIDER_SITE_OTHER): Payer: Medicare Other | Admitting: General Practice

## 2019-04-11 DIAGNOSIS — E1159 Type 2 diabetes mellitus with other circulatory complications: Secondary | ICD-10-CM | POA: Diagnosis not present

## 2019-04-11 DIAGNOSIS — N1831 Chronic kidney disease, stage 3a: Secondary | ICD-10-CM

## 2019-04-11 DIAGNOSIS — I10 Essential (primary) hypertension: Secondary | ICD-10-CM | POA: Diagnosis not present

## 2019-04-11 DIAGNOSIS — E785 Hyperlipidemia, unspecified: Secondary | ICD-10-CM | POA: Diagnosis not present

## 2019-04-11 DIAGNOSIS — E1169 Type 2 diabetes mellitus with other specified complication: Secondary | ICD-10-CM

## 2019-04-11 DIAGNOSIS — I152 Hypertension secondary to endocrine disorders: Secondary | ICD-10-CM

## 2019-04-11 DIAGNOSIS — E1121 Type 2 diabetes mellitus with diabetic nephropathy: Secondary | ICD-10-CM | POA: Diagnosis not present

## 2019-04-11 DIAGNOSIS — M5416 Radiculopathy, lumbar region: Secondary | ICD-10-CM

## 2019-04-11 DIAGNOSIS — E1122 Type 2 diabetes mellitus with diabetic chronic kidney disease: Secondary | ICD-10-CM

## 2019-04-11 DIAGNOSIS — G8929 Other chronic pain: Secondary | ICD-10-CM

## 2019-04-11 NOTE — Chronic Care Management (AMB) (Signed)
Chronic Care Management   Follow Up Note   04/11/2019 Name: Sean Reyes. MRN: [id number] DOB: 1940/11/13  Referred by: Sean Maple, MD Reason for referral : Chronic Care Management (DM2,HTN/HLD/AFIB/Chronic back pain/over active bladder)   Sean Reyes. is a 79 y.o. year old male who is a primary care patient of Crissman, Jeannette How, MD. The CCM team was consulted for assistance with chronic disease management and care coordination needs.    Review of patient status, including review of consultants reports, relevant laboratory and other test results, and collaboration with appropriate care team members and the patient's provider was performed as part of comprehensive patient evaluation and provision of chronic care management services.    SDOH (Social Determinants of Health) assessments performed: Yes See Care Plan activities for detailed interventions related to SDOH)  SDOH Interventions     Most Recent Value  SDOH Interventions  SDOH Interventions for the Following Domains  Physical Activity  Physical Activity Interventions  Other (Comments) [Patient has chronic back pain that is keeping him from exercising. He is hopeful that if he gets his nerve stimulaor he can get back to gym]       Outpatient Encounter Medications as of 04/11/2019  Medication Sig  . acetaminophen (TYLENOL) 500 MG tablet Take 500 mg by mouth every 6 (six) hours as needed (pain).  [name] amLODipine (NORVASC) 5 MG tablet Take 1 tablet (5 mg total) by mouth daily.  . benazepril (LOTENSIN) 40 MG tablet Take 1 tablet (40 mg total) by mouth daily.  . cetirizine (ZYRTEC) 10 MG tablet Take 1 tablet (10 mg total) by mouth daily.  [name] ELIQUIS 5 MG TABS tablet Take 1 tablet (5 mg total) by mouth 2 (two) times daily.  . finasteride (PROSCAR) 5 MG tablet Take 1 tablet (5 mg total) by mouth daily. (Patient taking differently: Take 5 mg by mouth at bedtime. )  . fluticasone (FLONASE) 50 MCG/ACT nasal spray Place 2 sprays into  both nostrils 2 (two) times daily.  . metFORMIN (GLUCOPHAGE-XR) 500 MG 24 hr tablet Take 2 tablets (1,000 mg total) by mouth 2 (two) times daily.  . metoprolol tartrate (LOPRESSOR) 50 MG tablet Take 50 mg by mouth 2 (two) times daily.  . pantoprazole (PROTONIX) 40 MG tablet Take 40 mg by mouth at bedtime.   . propafenone (RYTHMOL SR) 425 MG 12 hr capsule Take 425 mg by mouth 2 (two) times daily.  . traMADol (ULTRAM) 50 MG tablet Take 1 tablet (50 mg total) by mouth every 12 (twelve) hours as needed for severe pain. (Patient not taking: Reported on 03/05/2019)   No facility-administered encounter medications on file as of 04/11/2019.     Objective:  BP Readings from Last 3 Encounters:  03/05/19 136/88  02/03/19 (!) 158/91  01/20/19 133/82   Lab Results  Component Value Date   HGBA1C 6.4 01/20/2019    Goals Addressed            This Visit's Progress   . COMPLETED: I want to stay healthy during this virus (pt-stated)       Current Barriers:  [name] Knowledge Deficits related to COVID-19 and impact on patient self health management  Clinical Goal(s):  [name] Over the next 30 days, patient will verbalize basic understanding of COVID-19 impact on individual health and self health management as evidenced by verbalization of basic understanding of COVID-19 as a viral disease, measures to prevent exposure, signs and symptoms, when to contact provider  Interventions: . Call  to patient to f/u on recent hospitalization for salmonella/diarrhea . Patient c/o bouts of afib, patient has called cardiologist to f/u and has also started using Cpap machine more regularly . Patient stating diarrheal has improved as long as he does not drink milk . Patient stating he is being careful not to be around people in his weakened state to not catch Covid-19 . Patient stated his blood sugar with in good control 120-130's   . Patient stated the isolation has been hard for him because he is a people person, but he  understands. He also reports supportive family.   Patient Self Care Activities:  . Self administers medications as prescribed . Performs ADL's independently  Please see past updates related to this goal by clicking on the "Past Updates" button in the selected goal                    . RNCM: Pt-"I do the best I can" (pt-stated)       [place] (see longtitudinal plan of care for additional care plan information)  Current Barriers:  . Chronic Disease Management support, education, and care coordination needs related to Atrial Fibrillation, HTN, HLD, DMII, and Chronic Back Pain  Clinical Goal(s) related to Atrial Fibrillation, HTN, HLD, DMII, and Chronic  Back Pain :  Over the next 120 days, patient will:  . Work with the care management team to address educational, disease management, and care coordination needs  . Begin or continue self health monitoring activities as directed today Measure and record cbg (blood glucose) 1 times daily, Measure and record blood pressure 3 times per week, and follow a Heart Healthy/ADA diet . Call provider office for new or worsened signs and symptoms Blood glucose findings outside established parameters, Blood pressure findings outside established parameters, Shortness of breath, and New or worsened symptom related to AFIB and Chronic back pain . Call care management team with questions or concerns . Verbalize basic understanding of patient centered plan of care established today  Interventions related to Atrial Fibrillation, HTN, HLD, DMII, and Chronic Back Pain :  . Evaluation of current treatment plans and patient's adherence to plan as established by provider . Assessed patient understanding of disease states- the patient is very knowledgeable about his health conditions but chronic back pain limits him in managing his care effectively. Encouraged the patient to monitor his blood pressure and blood sugars more frequently . Assessed  patient's education and care coordination needs- education and support on monitoring his dietary intake- watching his sodium and sugar. Will continue to encourage healthy food choices . Provided disease specific education to patient- Information on DASH and ADA diets provided by the MyChart function  . Collaborated with appropriate clinical care team members regarding patient needs  Patient Self Care Activities related to Atrial Fibrillation, HTN, HLD, DMII, and Chronic Back Pain :  . Patient is unable to independently self-manage chronic health conditions  Initial goal documentation         Plan:   The care management team will reach out to the patient again over the next 60 days.    Noreene Larsson RN, MSN, [facility] Mobile: [phone]

## 2019-04-11 NOTE — Patient Instructions (Signed)
Visit Information  Goals Addressed            This Visit's Progress   . COMPLETED: I want to stay healthy during this virus (pt-stated)       Current Barriers:  [name] Knowledge Deficits related to COVID-19 and impact on patient self health management  Clinical Goal(s):  [name] Over the next 30 days, patient will verbalize basic understanding of COVID-19 impact on individual health and self health management as evidenced by verbalization of basic understanding of COVID-19 as a viral disease, measures to prevent exposure, signs and symptoms, when to contact provider  Interventions: . Call to patient to f/u on recent hospitalization for salmonella/diarrhea . Patient c/o bouts of afib, patient has called cardiologist to f/u and has also started using Cpap machine more regularly . Patient stating diarrheal has improved as long as he does not drink milk . Patient stating he is being careful not to be around people in his weakened state to not catch Covid-19 . Patient stated his blood sugar with in good control 120-130's   . Patient stated the isolation has been hard for him because he is a people person, but he understands. He also reports supportive family.   Patient Self Care Activities:  . Self administers medications as prescribed . Performs ADL's independently  Please see past updates related to this goal by clicking on the "Past Updates" button in the selected goal                    . RNCM: Pt-"I do the best I can" (pt-stated)       Downs (see longtitudinal plan of care for additional care plan information)  Current Barriers:  . Chronic Disease Management support, education, and care coordination needs related to Atrial Fibrillation, HTN, HLD, DMII, and Chronic Back Pain  Clinical Goal(s) related to Atrial Fibrillation, HTN, HLD, DMII, and Chronic  Back Pain :  Over the next 120 days, patient will:  . Work with the care management team to address educational,  disease management, and care coordination needs  . Begin or continue self health monitoring activities as directed today Measure and record cbg (blood glucose) 1 times daily, Measure and record blood pressure 3 times per week, and follow a Heart Healthy/ADA diet . Call provider office for new or worsened signs and symptoms Blood glucose findings outside established parameters, Blood pressure findings outside established parameters, Shortness of breath, and New or worsened symptom related to AFIB and Chronic back pain . Call care management team with questions or concerns . Verbalize basic understanding of patient centered plan of care established today  Interventions related to Atrial Fibrillation, HTN, HLD, DMII, and Chronic Back Pain :  . Evaluation of current treatment plans and patient's adherence to plan as established by provider . Assessed patient understanding of disease states- the patient is very knowledgeable about his health conditions but chronic back pain limits him in managing his care effectively. Encouraged the patient to monitor his blood pressure and blood sugars more frequently . Assessed patient's education and care coordination needs- education and support on monitoring his dietary intake- watching his sodium and sugar. Will continue to encourage healthy food choices . Provided disease specific education to patient- Information on DASH and ADA diets provided by the MyChart function  . Collaborated with appropriate clinical care team members regarding patient needs  Patient Self Care Activities related to Atrial Fibrillation, HTN, HLD, DMII, and Chronic Back Pain :  .  Patient is unable to independently self-manage chronic health conditions  Initial goal documentation        Patient verbalizes understanding of instructions provided today.   The care management team will reach out to the patient again over the next 60 days.   Noreene Larsson RN, MSN, [facility] Mobile: [phone]

## 2019-04-21 ENCOUNTER — Encounter: Payer: Self-pay | Admitting: Nurse Practitioner

## 2019-04-21 ENCOUNTER — Ambulatory Visit (INDEPENDENT_AMBULATORY_CARE_PROVIDER_SITE_OTHER): Payer: Medicare Other | Admitting: Nurse Practitioner

## 2019-04-21 VITALS — BP 121/76 | HR 60 | Temp 98.4°F

## 2019-04-21 DIAGNOSIS — E785 Hyperlipidemia, unspecified: Secondary | ICD-10-CM | POA: Diagnosis not present

## 2019-04-21 DIAGNOSIS — D6869 Other thrombophilia: Secondary | ICD-10-CM | POA: Diagnosis not present

## 2019-04-21 DIAGNOSIS — E1159 Type 2 diabetes mellitus with other circulatory complications: Secondary | ICD-10-CM

## 2019-04-21 DIAGNOSIS — E1122 Type 2 diabetes mellitus with diabetic chronic kidney disease: Secondary | ICD-10-CM

## 2019-04-21 DIAGNOSIS — I480 Paroxysmal atrial fibrillation: Secondary | ICD-10-CM | POA: Diagnosis not present

## 2019-04-21 DIAGNOSIS — D649 Anemia, unspecified: Secondary | ICD-10-CM

## 2019-04-21 DIAGNOSIS — I152 Hypertension secondary to endocrine disorders: Secondary | ICD-10-CM

## 2019-04-21 DIAGNOSIS — E538 Deficiency of other specified B group vitamins: Secondary | ICD-10-CM | POA: Diagnosis not present

## 2019-04-21 DIAGNOSIS — N1831 Chronic kidney disease, stage 3a: Secondary | ICD-10-CM

## 2019-04-21 DIAGNOSIS — E1121 Type 2 diabetes mellitus with diabetic nephropathy: Secondary | ICD-10-CM | POA: Diagnosis not present

## 2019-04-21 DIAGNOSIS — E1169 Type 2 diabetes mellitus with other specified complication: Secondary | ICD-10-CM | POA: Diagnosis not present

## 2019-04-21 DIAGNOSIS — I10 Essential (primary) hypertension: Secondary | ICD-10-CM | POA: Diagnosis not present

## 2019-04-21 LAB — MICROALBUMIN, URINE WAIVED
Creatinine, Urine Waived: 200 mg/dL (ref 10–300)
Microalb, Ur Waived: 80 mg/L — ABNORMAL HIGH (ref 0–19)

## 2019-04-21 LAB — BAYER DCA HB A1C WAIVED: HB A1C (BAYER DCA - WAIVED): 6.5 % (ref <7.0)

## 2019-04-21 NOTE — Assessment & Plan Note (Signed)
Chronic, ongoing.  No current statin, would benefit from this, continue to recommend.  Will check lipid panel today.

## 2019-04-21 NOTE — Assessment & Plan Note (Signed)
Chronic, ongoing.  Continue to monitor closely and refer to nephrology if decline.  Continue Benazepril for kidney protection.  Renal dose medications as needed based on labs. 

## 2019-04-21 NOTE — Assessment & Plan Note (Signed)
CBC today, patient on Eliquis.

## 2019-04-21 NOTE — Assessment & Plan Note (Signed)
Chronic, ongoing obtain A1C and urine micro today, adjust medications as needed.  Continue Benazepril for kidney protection and may consider reduction of Metformin in future dependent on kidney function on labs when checked.  Educated him on kidney disease and monitoring.  Consider referral to nephrology in future if ever any worsening of function noted.

## 2019-04-21 NOTE — Assessment & Plan Note (Signed)
Chronic, ongoing.  Continue current medication regimen and collaboration with cardiology.  Monitor CBC closely, H/H some mild decline noted 6 months after starting Eliquis.

## 2019-04-21 NOTE — Patient Instructions (Signed)
Carbohydrate Counting for Diabetes Mellitus, Adult  Carbohydrate counting is a method of keeping track of how many carbohydrates you eat. Eating carbohydrates naturally increases the amount of sugar (glucose) in the blood. Counting how many carbohydrates you eat helps keep your blood glucose within normal limits, which helps you manage your diabetes (diabetes mellitus). It is important to know how many carbohydrates you can safely have in each meal. This is different for every person. A diet and nutrition specialist (registered dietitian) can help you make a meal plan and calculate how many carbohydrates you should have at each meal and snack. Carbohydrates are found in the following foods:  Grains, such as breads and cereals.  Dried beans and soy products.  Starchy vegetables, such as potatoes, peas, and corn.  Fruit and fruit juices.  Milk and yogurt.  Sweets and snack foods, such as cake, cookies, candy, chips, and soft drinks. How do I count carbohydrates? There are two ways to count carbohydrates in food. You can use either of the methods or a combination of both. Reading "Nutrition Facts" on packaged food The "Nutrition Facts" list is included on the labels of almost all packaged foods and beverages in the [place] It includes:  The serving size.  Information about nutrients in each serving, including the grams (g) of carbohydrate per serving. To use the "Nutrition Facts":  Decide how many servings you will have.  Multiply the number of servings by the number of carbohydrates per serving.  The resulting number is the total amount of carbohydrates that you will be having. Learning standard serving sizes of other foods When you eat carbohydrate foods that are not packaged or do not include "Nutrition Facts" on the label, you need to measure the servings in order to count the amount of carbohydrates:  Measure the foods that you will eat with a food scale or measuring cup, if  needed.  Decide how many standard-size servings you will eat.  Multiply the number of servings by 15. Most carbohydrate-rich foods have about 15 g of carbohydrates per serving. ? For example, if you eat 8 oz (170 g) of strawberries, you will have eaten 2 servings and 30 g of carbohydrates (2 servings x 15 g = 30 g).  For foods that have more than one food mixed, such as soups and casseroles, you must count the carbohydrates in each food that is included. The following list contains standard serving sizes of common carbohydrate-rich foods. Each of these servings has about 15 g of carbohydrates:   hamburger bun or  English muffin.   oz (15 mL) syrup.   oz (14 g) jelly.  1 slice of bread.  1 six-inch tortilla.  3 oz (85 g) cooked rice or pasta.  4 oz (113 g) cooked dried beans.  4 oz (113 g) starchy vegetable, such as peas, corn, or potatoes.  4 oz (113 g) hot cereal.  4 oz (113 g) mashed potatoes or  of a large baked potato.  4 oz (113 g) canned or frozen fruit.  4 oz (120 mL) fruit juice.  4-6 crackers.  6 chicken nuggets.  6 oz (170 g) unsweetened dry cereal.  6 oz (170 g) plain fat-free yogurt or yogurt sweetened with artificial sweeteners.  8 oz (240 mL) milk.  8 oz (170 g) fresh fruit or one small piece of fruit.  24 oz (680 g) popped popcorn. Example of carbohydrate counting Sample meal  3 oz (85 g) chicken breast.  6 oz (170 g)   brown rice.  4 oz (113 g) corn.  8 oz (240 mL) milk.  8 oz (170 g) strawberries with sugar-free whipped topping. Carbohydrate calculation 1. Identify the foods that contain carbohydrates: ? Rice. ? Corn. ? Milk. ? Strawberries. 2. Calculate how many servings you have of each food: ? 2 servings rice. ? 1 serving corn. ? 1 serving milk. ? 1 serving strawberries. 3. Multiply each number of servings by 15 g: ? 2 servings rice x 15 g = 30 g. ? 1 serving corn x 15 g = 15 g. ? 1 serving milk x 15 g = 15 g. ? 1  serving strawberries x 15 g = 15 g. 4. Add together all of the amounts to find the total grams of carbohydrates eaten: ? 30 g + 15 g + 15 g + 15 g = 75 g of carbohydrates total. Summary  Carbohydrate counting is a method of keeping track of how many carbohydrates you eat.  Eating carbohydrates naturally increases the amount of sugar (glucose) in the blood.  Counting how many carbohydrates you eat helps keep your blood glucose within normal limits, which helps you manage your diabetes.  A diet and nutrition specialist (registered dietitian) can help you make a meal plan and calculate how many carbohydrates you should have at each meal and snack. This information is not intended to replace advice given to you by your health care provider. Make sure you discuss any questions you have with your health care provider. Document Revised: 08/24/2016 Document Reviewed: 07/14/2015 Elsevier Patient Education  2020 [employer]vier Inc.  

## 2019-04-21 NOTE — Assessment & Plan Note (Addendum)
Chronic, stable with BP at goal for age in office and on home readings.  Continue current medication regimen and collaboration with cardiology.  Recommend he continue to check BP at home at least 3 mornings a week and document.  Return in 3 months.

## 2019-04-21 NOTE — Assessment & Plan Note (Signed)
CBC today, patient on Eliquis.  Will also check iron, ferritin, and B12 levels.

## 2019-04-21 NOTE — Progress Notes (Signed)
BP 121/76   Pulse 60   Temp 98.4 F (36.9 C) (Oral)   SpO2 99%    Subjective:    Patient ID: Sean Reyes., male    DOB: 1940-04-28, 79 y.o.   MRN: [id number]  HPI: Sean Reyes. is a 79 y.o. male  Chief Complaint  Patient presents with  . Diabetes  . Hyperlipidemia  . Hypertension   DIABETES Recent A1C 6.4% in December.  Continues on Metformin daily. Hypoglycemic episodes:no Polydipsia/polyuria: no Visual disturbance: no Chest pain: no Paresthesias: no Glucose Monitoring: yes             Accucheck frequency: a couple times week             Fasting glucose: 120-125             Post prandial:             Evening:             Before meals: Taking Insulin?: no             Long acting insulin:             Short acting insulin: Blood Pressure Monitoring: a few times a day Retinal Examination: Up to Date Foot Exam: Up to Date Pneumovax: Up to Date Influenza: Up to Date Aspirin: no   HYPERTENSION / HYPERLIPIDEMIA Followed by Dr. Nehemiah Massed for cardiology.  Last seen 01/29/2019 with no changes made. Does have some anemia noted on labs, recent in November 11.6/33.8.  Started on Eliquis over a year ago for atrial fibrillation.   Satisfied with current treatment? yes Duration of hypertension: chronic BP monitoring frequency: a few times a week BP range: 130/80's  BP medication side effects: no Duration of hyperlipidemia: chronic Cholesterol medication side effects: no Cholesterol supplements: none Medication compliance: good compliance Aspirin: no Recent stressors: no Recurrent headaches: no Visual changes: no Palpitations: no Dyspnea: no Chest pain: no Lower extremity edema: no Dizzy/lightheaded: no   CHRONIC KIDNEY DISEASE December GFR 66 and CRT 1.08 === CrCl 44.26 CKD status: stable Medications renally dose: yes Previous renal evaluation: no Pneumovax:  Up to Date Influenza Vaccine:  Up to Date   ANEMIA Has history of lower H/H on review of  labs over couple years with HGB in 12's at times.  Does not take supplements. Anemia status: stable Etiology of anemia: unknown Fatigue: no Decreased exercise tolerance: no  Dyspnea on exertion: no Palpitations: no Bleeding: no Pica: no  Relevant past medical, surgical, family and social history reviewed and updated as indicated. Interim medical history since our last visit reviewed. Allergies and medications reviewed and updated.  Review of Systems  Constitutional: Negative for activity change, diaphoresis, fatigue and fever.  Respiratory: Negative for cough, chest tightness, shortness of breath and wheezing.   Cardiovascular: Negative for chest pain, palpitations and leg swelling.  Gastrointestinal: Negative.   Endocrine: Negative for polydipsia, polyphagia and polyuria.  Neurological: Negative.   Psychiatric/Behavioral: Negative.     Per HPI unless specifically indicated above     Objective:    BP 121/76   Pulse 60   Temp 98.4 F (36.9 C) (Oral)   SpO2 99%   Wt Readings from Last 3 Encounters:  03/05/19 194 lb (88 kg)  02/03/19 191 lb (86.6 kg)  01/20/19 192 lb 3.2 oz (87.2 kg)    Physical Exam Vitals and nursing note reviewed.  Constitutional:      General: He is awake. He is not  in acute distress.    Appearance: He is well-developed and well-groomed. He is not ill-appearing.  HENT:     Head: Normocephalic and atraumatic.     Right Ear: Hearing normal. No drainage.     Left Ear: Hearing normal. No drainage.     Mouth/Throat:     Pharynx: Uvula midline.  Eyes:     General: Lids are normal.        Right eye: No discharge.        Left eye: No discharge.     Conjunctiva/sclera: Conjunctivae normal.     Pupils: Pupils are equal, round, and reactive to light.  Neck:     Thyroid: No thyromegaly.     Vascular: No carotid bruit.  Cardiovascular:     Rate and Rhythm: Normal rate and regular rhythm.     Heart sounds: Normal heart sounds, S1 normal and S2 normal.  No murmur. No gallop.   Pulmonary:     Effort: Pulmonary effort is normal. No accessory muscle usage or respiratory distress.     Breath sounds: Normal breath sounds.  Abdominal:     General: Bowel sounds are normal.     Palpations: Abdomen is soft.  Musculoskeletal:        General: Normal range of motion.     Cervical back: Normal range of motion and neck supple.     Right lower leg: No edema.     Left lower leg: No edema.  Skin:    General: Skin is warm and dry.     Capillary Refill: Capillary refill takes less than 2 seconds.  Neurological:     Mental Status: He is alert and oriented to person, place, and time.  Psychiatric:        Attention and Perception: Attention normal.        Mood and Affect: Mood normal.        Behavior: Behavior normal. Behavior is cooperative.     Results for orders placed or performed in visit on 01/20/19  Bayer DCA Hb A1c Waived  Result Value Ref Range   HB A1C (BAYER DCA - WAIVED) 6.4 <7.0 %  CBC with Differential/Platelet out  Result Value Ref Range   WBC 6.0 3.4 - 10.8 x10E3/uL   RBC 4.04 (L) 4.14 - 5.80 x10E6/uL   Hemoglobin 12.3 (L) 13.0 - 17.7 g/dL   Hematocrit 37.6 37.5 - 51.0 %   MCV 93 79 - 97 fL   MCH 30.4 26.6 - 33.0 pg   MCHC 32.7 31.5 - 35.7 g/dL   RDW 13.6 11.6 - 15.4 %   Platelets 192 150 - 450 x10E3/uL   Neutrophils 62 Not Estab. %   Lymphs 27 Not Estab. %   Monocytes 10 Not Estab. %   Eos 1 Not Estab. %   Basos 0 Not Estab. %   Neutrophils Absolute 3.7 1.4 - 7.0 x10E3/uL   Lymphocytes Absolute 1.6 0.7 - 3.1 x10E3/uL   Monocytes Absolute 0.6 0.1 - 0.9 x10E3/uL   EOS (ABSOLUTE) 0.0 0.0 - 0.4 x10E3/uL   Basophils Absolute 0.0 0.0 - 0.2 x10E3/uL   Immature Granulocytes 0 Not Estab. %   Immature Grans (Abs) 0.0 0.0 - 0.1 x10E3/uL  Comprehensive metabolic panel  Result Value Ref Range   Glucose 157 (H) 65 - 99 mg/dL   BUN 16 8 - 27 mg/dL   Creatinine, Ser 1.08 0.76 - 1.27 mg/dL   GFR calc non Af Amer 66 >59 mL/min/1.73  GFR calc Af Amer 77 >59 mL/min/1.73   BUN/Creatinine Ratio 15 10 - 24   Sodium 140 134 - 144 mmol/L   Potassium 4.1 3.5 - 5.2 mmol/L   Chloride 102 96 - 106 mmol/L   CO2 25 20 - 29 mmol/L   Calcium 10.1 8.6 - 10.2 mg/dL   Total Protein 7.1 6.0 - 8.5 g/dL   Albumin 4.3 3.7 - 4.7 g/dL   Globulin, Total 2.8 1.5 - 4.5 g/dL   Albumin/Globulin Ratio 1.5 1.2 - 2.2   Bilirubin Total 0.3 0.0 - 1.2 mg/dL   Alkaline Phosphatase 90 39 - 117 IU/L   AST 17 0 - 40 IU/L   ALT 11 0 - 44 IU/L  Lipid Panel w/o Chol/HDL Ratio out  Result Value Ref Range   Cholesterol, Total 157 100 - 199 mg/dL   Triglycerides 124 0 - 149 mg/dL   HDL 47 >39 mg/dL   VLDL Cholesterol Cal 22 5 - 40 mg/dL   LDL Chol Calc (NIH) 88 0 - 99 mg/dL  Microalbumin, Urine Waived here  Result Value Ref Range   Microalb, Ur Waived 80 (H) 0 - 19 mg/L   Creatinine, Urine Waived 200 10 - 300 mg/dL   Microalb/Creat Ratio 30-300 (H) <30 mg/g  TSH  Result Value Ref Range   TSH 3.670 0.450 - 4.500 uIU/mL      Assessment & Plan:   Problem List Items Addressed This Visit      Cardiovascular and Mediastinum   Hypertension associated with diabetes ([place])    Chronic, stable with BP at goal for age in office and on home readings.  Continue current medication regimen and collaboration with cardiology.  Recommend he continue to check BP at home at least 3 mornings a week and document.  Return in 3 months.      Relevant Medications   metoprolol succinate (TOPROL-XL) 25 MG 24 hr tablet   Other Relevant Orders   Bayer DCA Hb A1c Waived   Paroxysmal atrial fibrillation (HCC)    Chronic, ongoing.  Continue current medication regimen and collaboration with cardiology.  Monitor CBC closely, H/H some mild decline noted 6 months after starting Eliquis.      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  Continue Benazepril for kidney protection and may consider reduction of Metformin in future dependent on kidney function on labs when checked.  Educated him on kidney disease and monitoring.  Consider referral to nephrology in future if ever any worsening of function noted.      Relevant Orders   Bayer DCA Hb A1c Waived   Microalbumin, Urine Waived   Basic metabolic panel   Hyperlipidemia associated with type 2 diabetes mellitus (HCC)    Chronic, ongoing.  No current statin, would benefit from this, continue to recommend.  Will check lipid panel today.      Relevant Orders   Bayer DCA Hb A1c Waived   Lipid Panel w/o Chol/HDL Ratio     Genitourinary   CKD (chronic kidney disease) stage 3, GFR 30-59 ml/min    Chronic, ongoing.  Continue to monitor closely and refer to nephrology if decline.  Continue Benazepril for kidney protection.  Renal dose medications as needed based on labs.  Hematopoietic and Hemostatic   Acquired thrombophilia ([place])    CBC today, patient on Eliquis.        Other   Anemia    CBC today, patient on Eliquis.  Will also check iron, ferritin, and B12 levels.      Relevant Orders   CBC with Differential/Platelet   Iron, TIBC and Ferritin Panel   B12    Other Visit Diagnoses    B12 deficiency       History of low level reported, check today.   Relevant Orders   B12       Follow up plan: Return in about 3 months (around 07/22/2019) for T2DM, HTN/HLD.

## 2019-04-22 LAB — BASIC METABOLIC PANEL
BUN/Creatinine Ratio: 13 (ref 10–24)
BUN: 14 mg/dL (ref 8–27)
CO2: 24 mmol/L (ref 20–29)
Calcium: 9.8 mg/dL (ref 8.6–10.2)
Chloride: 100 mmol/L (ref 96–106)
Creatinine, Ser: 1.05 mg/dL (ref 0.76–1.27)
GFR calc Af Amer: 79 mL/min/{1.73_m2} (ref >59)
GFR calc non Af Amer: 69 mL/min/{1.73_m2} (ref >59)
Glucose: 189 mg/dL — ABNORMAL HIGH (ref 65–99)
Potassium: 4.1 mmol/L (ref 3.5–5.2)
Sodium: 137 mmol/L (ref 134–144)

## 2019-04-22 LAB — CBC WITH DIFFERENTIAL/PLATELET
Basophils Absolute: 0 10*3/uL (ref 0.0–0.2)
Basos: 0 %
EOS (ABSOLUTE): 0.1 10*3/uL (ref 0.0–0.4)
Eos: 1 %
Hematocrit: 37 % — ABNORMAL LOW (ref 37.5–51.0)
Hemoglobin: 12.1 g/dL — ABNORMAL LOW (ref 13.0–17.7)
Immature Grans (Abs): 0 10*3/uL (ref 0.0–0.1)
Immature Granulocytes: 0 %
Lymphocytes Absolute: 1.6 10*3/uL (ref 0.7–3.1)
Lymphs: 27 %
MCH: 30.6 pg (ref 26.6–33.0)
MCHC: 32.7 g/dL (ref 31.5–35.7)
MCV: 94 fL (ref 79–97)
Monocytes Absolute: 0.6 10*3/uL (ref 0.1–0.9)
Monocytes: 10 %
Neutrophils Absolute: 3.6 10*3/uL (ref 1.4–7.0)
Neutrophils: 62 %
Platelets: 194 10*3/uL (ref 150–450)
RBC: 3.95 x10E6/uL — ABNORMAL LOW (ref 4.14–5.80)
RDW: 13.7 % (ref 11.6–15.4)
WBC: 5.8 10*3/uL (ref 3.4–10.8)

## 2019-04-22 LAB — LIPID PANEL W/O CHOL/HDL RATIO
Cholesterol, Total: 146 mg/dL (ref 100–199)
HDL: 40 mg/dL (ref >39)
LDL Chol Calc (NIH): 75 mg/dL (ref 0–99)
Triglycerides: 184 mg/dL — ABNORMAL HIGH (ref 0–149)
VLDL Cholesterol Cal: 31 mg/dL (ref 5–40)

## 2019-04-22 LAB — IRON,TIBC AND FERRITIN PANEL
Ferritin: 49 ng/mL (ref 30–400)
Iron Saturation: 30 % (ref 15–55)
Iron: 107 ug/dL (ref 38–169)
Total Iron Binding Capacity: 362 ug/dL (ref 250–450)
UIBC: 255 ug/dL (ref 111–343)

## 2019-04-22 LAB — VITAMIN B12: Vitamin B-12: 220 pg/mL — ABNORMAL LOW (ref 232–1245)

## 2019-04-22 NOTE — Progress Notes (Signed)
Contacted via MyChart

## 2019-04-23 DIAGNOSIS — G4733 Obstructive sleep apnea (adult) (pediatric): Secondary | ICD-10-CM | POA: Diagnosis not present

## 2019-04-23 DIAGNOSIS — E663 Overweight: Secondary | ICD-10-CM | POA: Diagnosis not present

## 2019-04-24 ENCOUNTER — Other Ambulatory Visit: Payer: Self-pay

## 2019-04-24 MED ORDER — FINASTERIDE 5 MG PO TABS: 5.0000 mg | ORAL_TABLET | Freq: Every day | ORAL | 2 refills | Status: DC

## 2019-04-24 NOTE — Telephone Encounter (Signed)
LOV 04/15/2019

## 2019-05-08 DIAGNOSIS — F4322 Adjustment disorder with anxiety: Secondary | ICD-10-CM | POA: Diagnosis not present

## 2019-05-14 DIAGNOSIS — H25813 Combined forms of age-related cataract, bilateral: Secondary | ICD-10-CM | POA: Diagnosis not present

## 2019-05-14 DIAGNOSIS — E119 Type 2 diabetes mellitus without complications: Secondary | ICD-10-CM | POA: Diagnosis not present

## 2019-05-14 LAB — HM DIABETES EYE EXAM

## 2019-05-15 DIAGNOSIS — U071 COVID-19: Secondary | ICD-10-CM | POA: Insufficient documentation

## 2019-05-20 ENCOUNTER — Telehealth: Payer: Self-pay | Admitting: Nurse Practitioner

## 2019-05-20 NOTE — Telephone Encounter (Signed)
Patient calling to speak with CMA regarding latest [place] check. He states he had never gotten the result via MyChart.

## 2019-05-20 NOTE — Telephone Encounter (Signed)
Called and left a detailed message with patient's last A1C result.

## 2019-05-29 DIAGNOSIS — M542 Cervicalgia: Secondary | ICD-10-CM | POA: Diagnosis not present

## 2019-05-29 DIAGNOSIS — M47812 Spondylosis without myelopathy or radiculopathy, cervical region: Secondary | ICD-10-CM | POA: Diagnosis not present

## 2019-06-04 ENCOUNTER — Ambulatory Visit: Payer: Self-pay | Admitting: General Practice

## 2019-06-04 ENCOUNTER — Telehealth: Payer: Medicare Other | Admitting: General Practice

## 2019-06-04 DIAGNOSIS — G8929 Other chronic pain: Secondary | ICD-10-CM

## 2019-06-04 DIAGNOSIS — E1121 Type 2 diabetes mellitus with diabetic nephropathy: Secondary | ICD-10-CM | POA: Diagnosis not present

## 2019-06-04 DIAGNOSIS — I10 Essential (primary) hypertension: Secondary | ICD-10-CM | POA: Diagnosis not present

## 2019-06-04 DIAGNOSIS — N1831 Chronic kidney disease, stage 3a: Secondary | ICD-10-CM | POA: Diagnosis not present

## 2019-06-04 DIAGNOSIS — M5416 Radiculopathy, lumbar region: Secondary | ICD-10-CM

## 2019-06-04 DIAGNOSIS — E1169 Type 2 diabetes mellitus with other specified complication: Secondary | ICD-10-CM | POA: Diagnosis not present

## 2019-06-04 DIAGNOSIS — E785 Hyperlipidemia, unspecified: Secondary | ICD-10-CM

## 2019-06-04 DIAGNOSIS — E538 Deficiency of other specified B group vitamins: Secondary | ICD-10-CM

## 2019-06-04 DIAGNOSIS — E1122 Type 2 diabetes mellitus with diabetic chronic kidney disease: Secondary | ICD-10-CM

## 2019-06-04 DIAGNOSIS — I4891 Unspecified atrial fibrillation: Secondary | ICD-10-CM | POA: Diagnosis not present

## 2019-06-04 NOTE — Patient Instructions (Addendum)
Visit Information  Goals Addressed            This Visit's Progress   . RNCM: Pt-"I do the best I can" (pt-stated)       CARE PLAN ENTRY (see longtitudinal plan of care for additional care plan information)  Current Barriers:  . Chronic Disease Management support, education, and care coordination needs related to Atrial Fibrillation, HTN, HLD, DMII, and Chronic Back Pain . Limited mobility related to chronic back pain and discomfort  Clinical Goal(s) related to Atrial Fibrillation, HTN, HLD, DMII, and Chronic  Back Pain :  Over the next 120 days, patient will:  . Work with the care management team to address educational, disease management, and care coordination needs  . Begin or continue self health monitoring activities as directed today Measure and record cbg (blood glucose) 1 times daily, Measure and record blood pressure 3 times per week, and follow a Heart Healthy/ADA diet . Call provider office for new or worsened signs and symptoms Blood glucose findings outside established parameters, Blood pressure findings outside established parameters, Shortness of breath, and New or worsened symptom related to AFIB and Chronic back pain . Call care management team with questions or concerns . Verbalize basic understanding of patient centered plan of care established today  Interventions related to Atrial Fibrillation, HTN, HLD, DMII, and Chronic Back Pain :  . Evaluation of current treatment plans and patient's adherence to plan as established by provider.  The patient is going on May 25th to have a "spine pain stimulator" placed.  He will go back on June the 1st and if this is working for him it will be permanently implanted. The patient is hopeful this will give him much needed relief related to his chronic back pain. His pain level is tolerable when he is sitting down but when he is walking it is almost "unbearable".  Education and support.  . Assessed patient understanding of disease  states- the patient is very knowledgeable about his health conditions but chronic back pain limits him in managing his care effectively. Encouraged the patient to monitor his blood pressure and blood sugars more frequently.  The patient had a recent hemoglobin A1C of 6.5.  He will continue to take his readings and record findings. He will bring at his next scheduled appointment.  . Assessed patient's education and care coordination needs- education and support on monitoring his dietary intake- watching his sodium and sugar. Will continue to encourage healthy food choices.  Patient verbalized he has gained weight and his weight is 197 now.  . Assessed safety when ambulating. The patient is using a "crutch" when he walks. The patient verbalized he would not be able to walk if he did not use his crutch. Encouraged the patient to pace his activities and know his limitations. The patient does not like to take pain medications and "deals with the pain".  Encouraged the patient to listen to his body and take breaks as needed to help with pain management.  . Provided disease specific education to patient- Information on DASH and ADA diets provided by the MyChart function- completed.  Will search for information related to spine pain stimulator to send by MyChart for the patient.  . Review of Vitamin B12 supplement as recommended by pcp. The patient has started taking the supplement of Vitamin B12 as recommended by the pcp.  Nash Dimmer with appropriate clinical care team members regarding patient needs  Patient Self Care Activities related to Atrial Fibrillation, HTN,  HLD, DMII, and Chronic Back Pain :  . Patient is unable to independently self-manage chronic health conditions  Please see past updates related to this goal by clicking on the "Past Updates" button in the selected goal         Patient verbalizes understanding of instructions provided today.   The care management team will reach out to the  patient again over the next 60 days.   Noreene Larsson RN, MSN, [facility] Mobile: [phone]   Spinal Cord Stimulation Trial Information A spinal cord stimulation trial is a test to see whether a spinal cord stimulator reduces your pain. A spinal cord stimulator is a small device that is inserted (implanted) in your back. The stimulator has small wires (leads) that connect it to your spinal cord. The stimulator sends electrical pulses through the leads to the spinal cord. This can relieve pain. Settings for the stimulator can be adjusted with a remote device to find the best pain control. Your health care provider may suggest a spinal cord stimulation trial if other treatments for long-lasting (chronic) pain have not worked for you. Spinal cord stimulation may be used to manage pain that is caused by:  Coronary artery disease or peripheral vascular disease.  Failed back surgery.  Phantom limb sensation.  Peripheral neuropathy.  Complex regional pain syndrome.  Other syndromes that involve chronic pain. For the trial, the stimulator is attached to your back instead of inserted under the skin. A trial period is usually 3-5 days, but this can vary among health care providers. After your trial period, you and your health care provider will discuss whether a permanent spinal cord stimulator is an option for you. The permanent stimulator may be an option depending on:  Whether the stimulator reduces your pain during the trial.  Whether the stimulator fits into your lifestyle.  Whether the cost of the stimulator is covered by your insurance. What are the risks? Generally, a spinal cord stimulation trial is safe. However, problems can occur, including:  Bleeding or pain at the insertion site of the leads.  Infection at the insertion site or around the leads.  Allergic reactions to medicines, devices, or  dyes.  Damage to the skin, nerves, back muscles, or spinal cord where the leads are placed.  Inability to move the legs (paralysis).  Numbness in the legs.  Inability to control when you urinate or have a bowel movement (incontinence).  Spinal fluid leakage. How is a spinal cord stimulator placed for a trial?  For a trial period, the stimulator is placed on your skin, not under it. Only the leads that connect the stimulator to the spinal cord are implanted under your skin. The exact location of the stimulator depends on where you have pain. There are two types of surgery for implanting the leads:  Noninvasive surgery. In this type of surgery, a small incision is made and needles are used to place the leads under your skin.  Open surgery. In this type of surgery, a larger incision is made, and the leads are implanted directly into your back. How should I care for myself during the trial period? Activity  Return to your normal activities as told by your health care provider. Ask your health care provider what activities are safe for you.  Do not lift anything that is heavier than 10 lb (4.5 kg), or the limit that you are told. General instructions  Follow your  health care provider's specific instructions about how to take care of your spinal cord stimulator and your incision.  Make sure you write down the following information so that you can share this information with your health care provider: ? Your responses to the stimulator. Describe these as told by your health care provider. ? Your pain level throughout the day. ? The amount and kind of pain medicine that you take.  Take over-the-counter and prescription medicines only as told by your health care provider.  Do not take baths, swim, or use a hot tub until your health care provider approves.  Tell all health care providers who provide care for you that you have a spinal cord stimulator. This is important information that  could affect the medical treatment that you receive.  Keep all follow-up visits as told by your health care provider. This is important. When should I seek medical care? During the trial, seek medical care if:  You have redness, swelling, or pain around your incision.  You have fluid or blood coming from your incision.  Your incision feels warm to the touch.  You have pus or a bad smell coming from your incision.  The bandage (dressing) that covers your incision comes off. Get help right away if:  Your pain gets worse.  The stimulator leads come out.  You develop numbness or weakness in your legs, or you have difficulty walking.  You have problems urinating or having a bowel movement.  You have a fever.  You have symptoms that last for more than 2-3 days.  Your symptoms suddenly get worse. Summary  A spinal cord stimulator is a small device that sends electrical pulses to your spinal cord. This can relieve pain caused by many different health conditions.  Before a permanent stimulator is placed, you will have a trial using a temporary stimulator that is not implanted under your skin. This helps determine if a stimulator will reduce your pain.  For the trial, only the leads that connect the stimulator to the spinal cord are implanted under your skin.  During the trial period, make sure you write down information about your pain and your responses to the stimulator so that you can share this information with your health care provider.  Keep all follow-up visits as told by your health care provider. This is important. Contact your health care provider if you have symptoms that indicate a problem. This information is not intended to replace advice given to you by your health care provider. Make sure you discuss any questions you have with your health care provider. Document Revised: 10/25/2018 Document Reviewed: 03/06/2018 Elsevier Patient Education  2020 [place].  What  You Need to Know About Chronic Back Pain Long-term (chronic) back pain is back pain that lasts for 12 weeks or longer. It often affects the lower back and can range from mild to severe. Many people have back pain at some point in their lives. It can feel different to each person. It may feel like a muscle ache or a sharp, stabbing pain. The pain often gets worse over time. It can be difficult to find the cause of chronic back pain. Treating chronic back pain often starts with rest and pain relief, followed by exercises (physical therapy) to strengthen the muscles that support your back. You may have to try different things to see what works best for you. If other treatments do not help, or if your pain is caused by a condition or an  injury, you may need surgery. How can back pain affect me? Chronic back pain is uncomfortable and can make it hard to do your usual daily activities. Chronic back pain can:  Cause numbness and tingling.  Come and go.  Get worse when you are sitting, standing, walking, bending, or lifting.  Affect you while you are active, at rest, or both.  Eventually make it hard to move around.  Occur with fever, weight loss, or difficulty urinating. What are the benefits of treating back pain? Treating chronic back pain may:  Relieve pain.  Keep your pain from getting worse.  Make it easier for you to do your usual activities. What are some steps I can take to decrease my back pain?   Take over-the-counter or prescription medicines only as told by your health care provider.  If directed, apply heat to the affected area. Use the heat source that your health care provider recommends, such as a moist heat pack or a heating pad. ? Place a towel between your skin and the heat source. ? Leave the heat on for 20-30 minutes. ? Remove the heat if your skin turns bright red. This is especially important if you are unable to feel pain, heat, or cold. You may have a greater risk  of getting burned.  If directed, put ice on the affected area: ? Put ice in a plastic bag. ? Place a towel between your skin and the bag. ? Leave the ice on for 20 minutes, 2-3 times a day.  Get regular exercise as told by your health care provider to improve flexibility and strength.  Do not smoke.  Maintain a healthy weight.  When lifting objects: ? Keep your feet as far apart as your shoulders (shoulder-width apart) or farther apart. ? Tighten the muscles in your abdomen. ? Bend your knees and hips and keep your spine neutral. It is important to lift using the strength of your legs, not your back. Do not lock your knees straight out. ? Always ask for help to lift heavy or awkward objects. What can happen if my back pain goes untreated? Untreated back pain can:  Get worse over time.  Start to occur more often or at different times, such as when you are resting.  Cause posture problems.  Make it hard to move around (limit mobility). Where can I get support? Chronic back pain can be a frustrating condition to manage. It may help to talk with other people who are having a similar experience. Consider joining a support group for people dealing with chronic back pain. Ask your health care provider about support groups in your area. You can also find online and in-person support groups through:  The American Chronic Pain Association: DeluxeOption.si  The U.[place]. Pain Foundation: uspainfoundation.org/support-groups Contact a health care provider if:  Your symptoms do not get better or they get worse.  You have severe back pain.  You have chronic back pain and a fever.  You lose weight without trying.  You have difficulty urinating.  You experience numbness or tingling.  You develop new pain after an injury. Summary  Chronic back pain is often treated with rest, pain relief, and physical therapy.  Get regular exercise to improve your strength and  flexibility.  Put heat and ice on the affected areas as directed by your health care provider.  Chronic back pain can be challenging to live with. Joining a support group may help you manage your condition. This information is not  intended to replace advice given to you by your health care provider. Make sure you discuss any questions you have with your health care provider. Document Revised: 01/12/2017 Document Reviewed: 10/09/2015 Elsevier Patient Education  2020 [employer].  Spinal Cord Stimulator Implantation, Care After This sheet gives you information about how to care for yourself after your procedure. Your health care provider may also give you more specific instructions. If you have problems or questions, contact your health care provider. What can I expect after the procedure? After the procedure, it is common to have: Mild pain, bruising, and swelling. Headaches. Soreness in the back. Follow these instructions at home: Incision care  Follow instructions from your health care provider about how to take care of your incisions. Make sure you: Wash your hands with soap and water before you change your bandages (dressings). If soap and water are not available, use hand sanitizer. Change your dressings as told by your health care provider. Leave stitches (sutures), skin glue, or adhesive strips in place. These skin closures may need to stay in place for 2 weeks or longer. If adhesive strip edges start to loosen and curl up, you may trim the loose edges. Do not remove adhesive strips completely unless your health care provider tells you to do that. Check your incision areas every day for signs of infection. Check for: Redness, more swelling, or more pain. More fluid or blood. Warmth. Pus or a bad smell. Activity Ask your health care provider what activities are safe for you during recovery. Do not do any of the following until your health care provider approves: Drive. Activity  that requires a lot of energy, including exercise and sports. Lift anything that is heavier than 5 lb (2.3 kg), or the limit that you are told. Engage in sexual activity. Lift your arms above your head. Sleep on your stomach, if your device was placed in your abdomen. Bend, twist, stretch, or reach for things. Bathing Do not take baths, swim, or use a hot tub until your health care provider approves. Ask your health care provider if you may take showers. You may only be allowed to take sponge baths. Safety Devices that sends out wireless signals may affect your stimulator. If directed by your health care provider, avoid the following: MRI and ultrasound tests. Metal detectors. Anti-theft devices. Generators or power lines. Always carry your device ID card with you. Tell all health care providers who care for you that you have a spinal cord stimulator. This is important information that could affect the medical treatment that you receive. General instructions Work with your health care provider to adjust your settings as needed for pain control. Adjusting settings may take some time. Most stimulators can be controlled with a remote. Take over-the-counter and prescription medicines only as told by your health care provider. Do not use any products that contain nicotine or tobacco, such as cigarettes and e-cigarettes. If you need help quitting, ask your health care provider. Drink enough fluid to keep your urine pale yellow. Keep all follow-up visits as told by your health care provider. This is important. Contact a health care provider if you have: A fever. Severe pain, and medicines do not help. More fluid or blood coming from your incisions. Headaches that do not go away. Get help right away if: You have redness, more swelling, or more pain around your incisions. Your incision area feels warm to the touch. You have pus or a bad smell coming from your incision area.  You have chest pain  or problems breathing. You have sudden and severe back pain. You have weakness or sudden muscle tightening (spasms) in your legs. You are not able to control when you urinate or have a bowel movement (incontinence). Summary After implantation of a spinal cord stimulator, mild pain, muscle soreness, headaches, and bruising are common. Follow instructions from your health care provider about incision care, bathing, medicines, and activity. Work with your health care provider to adjust your settings as needed for pain control. Adjusting settings may take some time. Most stimulators can be controlled with a remote. This information is not intended to replace advice given to you by your health care provider. Make sure you discuss any questions you have with your health care provider. Document Revised: 08/30/2018 Document Reviewed: 03/15/2017 Elsevier Patient Education  2020 [employer].

## 2019-06-04 NOTE — Chronic Care Management (AMB) (Signed)
Chronic Care Management   Follow Up Note   06/04/2019 Name: Sean Reyes. MRN: [id number] DOB: August 05, 1940  Referred by: [name], NP Reason for referral : Chronic Care Management (Follow up: Chronic Disease Management and care coordination needs: Follow up Chronic pain/HTN/HLD/AFib/DM )   Sean Reyes. is a 79 y.o. year old male who is a primary care patient of [name], [name], NP. The CCM team was consulted for assistance with chronic disease management and care coordination needs.    Review of patient status, including review of consultants reports, relevant laboratory and other test results, and collaboration with appropriate care team members and the patient's provider was performed as part of comprehensive patient evaluation and provision of chronic care management services.    SDOH (Social Determinants of Health) assessments performed: Yes- activity level decreased due to chronic back pain See Care Plan activities for detailed interventions related to [facility])     Outpatient Encounter Medications as of 06/04/2019  Medication Sig   acetaminophen (TYLENOL) 500 MG tablet Take 500 mg by mouth every 6 (six) hours as needed (pain).   amLODipine (NORVASC) 5 MG tablet Take 1 tablet (5 mg total) by mouth daily.   benazepril (LOTENSIN) 40 MG tablet Take 1 tablet (40 mg total) by mouth daily.   cetirizine (ZYRTEC) 10 MG tablet Take 1 tablet (10 mg total) by mouth daily.   ELIQUIS 5 MG TABS tablet Take 1 tablet (5 mg total) by mouth 2 (two) times daily.   finasteride (PROSCAR) 5 MG tablet Take 1 tablet (5 mg total) by mouth daily.   fluticasone (FLONASE) 50 MCG/ACT nasal spray Place 2 sprays into both nostrils 2 (two) times daily.   metFORMIN (GLUCOPHAGE-XR) 500 MG 24 hr tablet Take 2 tablets (1,000 mg total) by mouth 2 (two) times daily.   metoprolol succinate (TOPROL-XL) 25 MG 24 hr tablet Take 2 tablets by mouth 2 (two) times daily.   pantoprazole (PROTONIX) 40 MG  tablet Take 40 mg by mouth at bedtime.    propafenone (RYTHMOL SR) 425 MG 12 hr capsule Take 425 mg by mouth 2 (two) times daily.   No facility-administered encounter medications on file as of 06/04/2019.     Objective:  BP Readings from Last 3 Encounters:  04/21/19 121/76  03/05/19 136/88  02/03/19 (!) 158/91   Lab Results  Component Value Date   HGBA1C 6.5 04/21/2019   Lab Results  Component Value Date   CHOL 146 04/21/2019   HDL 40 04/21/2019   LDLCALC 75 04/21/2019   TRIG 184 (H) 04/21/2019   CHOLHDL 3.6 12/20/2017    Goals Addressed            This Visit's Progress    RNCM: Pt-"I do the best I can" (pt-stated)       CARE PLAN ENTRY (see longtitudinal plan of care for additional care plan information)  Current Barriers:   Chronic Disease Management support, education, and care coordination needs related to Atrial Fibrillation, HTN, HLD, DMII, and Chronic Back Pain  Limited mobility related to chronic back pain and discomfort  Clinical Goal(s) related to Atrial Fibrillation, HTN, HLD, DMII, and Chronic  Back Pain :  Over the next 120 days, patient will:   Work with the care management team to address educational, disease management, and care coordination needs   Begin or continue self health monitoring activities as directed today Measure and record cbg (blood glucose) 1 times daily, Measure and record blood pressure 3 times per  week, and follow a Heart Healthy/ADA diet  Call provider office for new or worsened signs and symptoms Blood glucose findings outside established parameters, Blood pressure findings outside established parameters, Shortness of breath, and New or worsened symptom related to AFIB and Chronic back pain  Call care management team with questions or concerns  Verbalize basic understanding of patient centered plan of care established today  Interventions related to Atrial Fibrillation, HTN, HLD, DMII, and Chronic Back Pain :   Evaluation  of current treatment plans and patient's adherence to plan as established by provider.  The patient is going on May 25th to have a "spine pain stimulator" placed.  He will go back on June the 1st and if this is working for him it will be permanently implanted. The patient is hopeful this will give him much needed relief related to his chronic back pain. His pain level is tolerable when he is sitting down but when he is walking it is almost "unbearable".  Education and support.   Assessed patient understanding of disease states- the patient is very knowledgeable about his health conditions but chronic back pain limits him in managing his care effectively. Encouraged the patient to monitor his blood pressure and blood sugars more frequently.  The patient had a recent hemoglobin A1C of 6.5.  He will continue to take his readings and record findings. He will bring at his next scheduled appointment.   Assessed patient's education and care coordination needs- education and support on monitoring his dietary intake- watching his sodium and sugar. Will continue to encourage healthy food choices.  Patient verbalized he has gained weight and his weight is 197 now.   Assessed safety when ambulating. The patient is using a "crutch" when he walks. The patient verbalized he would not be able to walk if he did not use his crutch. Encouraged the patient to pace his activities and know his limitations. The patient does not like to take pain medications and "deals with the pain".  Encouraged the patient to listen to his body and take breaks as needed to help with pain management.   Provided disease specific education to patient- Information on DASH and ADA diets provided by the MyChart function- completed.  Will search for information related to spine pain stimulator to send by MyChart for the patient.   Review of Vitamin B12 supplement as recommended by pcp. The patient has started taking the supplement of Vitamin B12 as  recommended by the pcp.   Collaborated with appropriate clinical care team members regarding patient needs  Patient Self Care Activities related to Atrial Fibrillation, HTN, HLD, DMII, and Chronic Back Pain :   Patient is unable to independently self-manage chronic health conditions  Please see past updates related to this goal by clicking on the "Past Updates" button in the selected goal          Plan:   The care management team will reach out to the patient again over the next 60 days.    Noreene Larsson RN, MSN, [facility] Mobile: [phone]

## 2019-06-17 DIAGNOSIS — E785 Hyperlipidemia, unspecified: Secondary | ICD-10-CM | POA: Diagnosis not present

## 2019-06-17 DIAGNOSIS — I471 Supraventricular tachycardia: Secondary | ICD-10-CM | POA: Diagnosis not present

## 2019-06-17 DIAGNOSIS — G4733 Obstructive sleep apnea (adult) (pediatric): Secondary | ICD-10-CM | POA: Diagnosis not present

## 2019-06-17 DIAGNOSIS — N183 Chronic kidney disease, stage 3 unspecified: Secondary | ICD-10-CM | POA: Diagnosis not present

## 2019-06-17 DIAGNOSIS — I10 Essential (primary) hypertension: Secondary | ICD-10-CM | POA: Diagnosis not present

## 2019-06-17 DIAGNOSIS — I480 Paroxysmal atrial fibrillation: Secondary | ICD-10-CM | POA: Diagnosis not present

## 2019-06-17 DIAGNOSIS — Z01818 Encounter for other preprocedural examination: Secondary | ICD-10-CM | POA: Diagnosis not present

## 2019-06-17 DIAGNOSIS — E1169 Type 2 diabetes mellitus with other specified complication: Secondary | ICD-10-CM | POA: Diagnosis not present

## 2019-06-17 DIAGNOSIS — E1122 Type 2 diabetes mellitus with diabetic chronic kidney disease: Secondary | ICD-10-CM | POA: Diagnosis not present

## 2019-06-21 DIAGNOSIS — M5416 Radiculopathy, lumbar region: Secondary | ICD-10-CM | POA: Diagnosis not present

## 2019-06-21 DIAGNOSIS — Z20822 Contact with and (suspected) exposure to covid-19: Secondary | ICD-10-CM | POA: Diagnosis not present

## 2019-06-22 IMAGING — RF DG LUMBAR SPINE COMPLETE 4+V
1 series · 4 of 4 positions shown · non-contrast
Comparison: CT 03/30/2017

CLINICAL DATA: L2 through L5  PLIF

EXAM:
DG C-ARM 61-120 MIN; LUMBAR SPINE - COMPLETE 4+ VIEW

[Series 1: run · 4 of 4 slices shown]
[im 1/4]
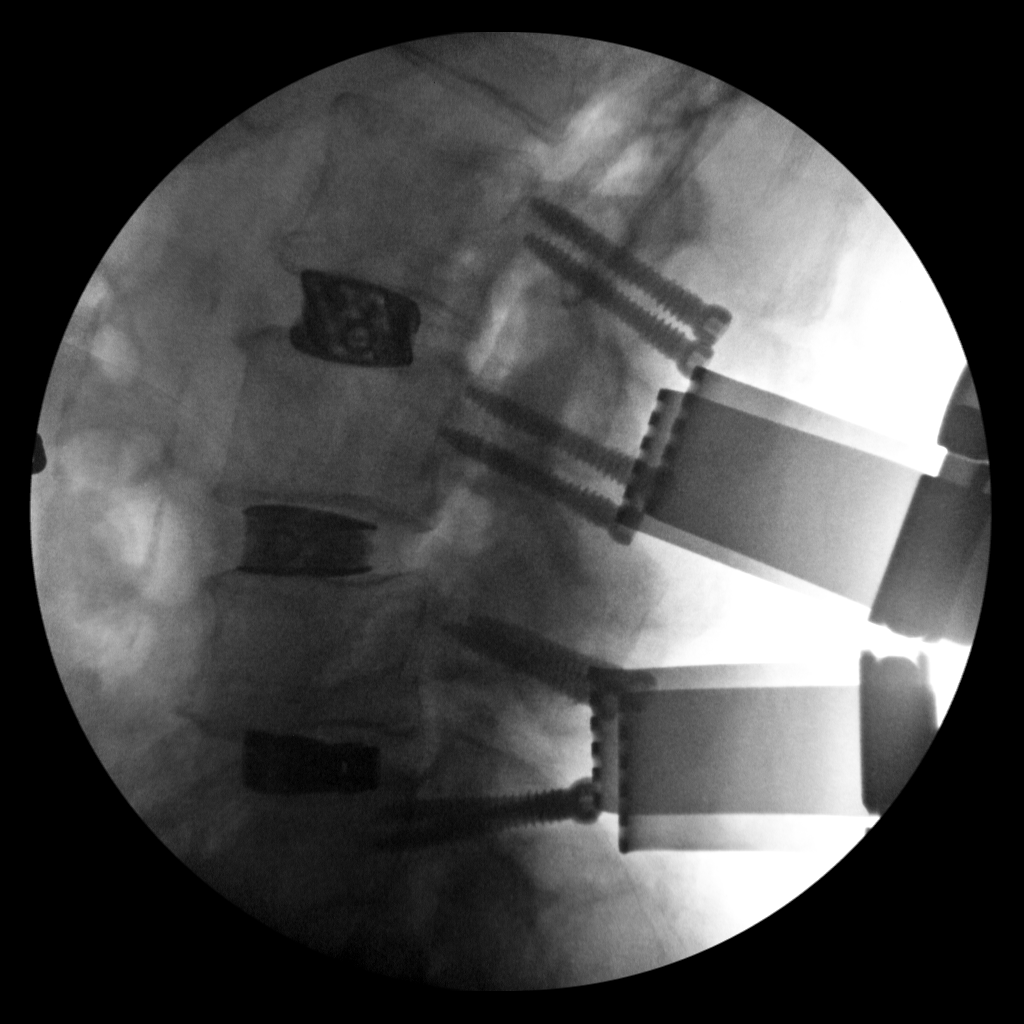
[im 2/4]
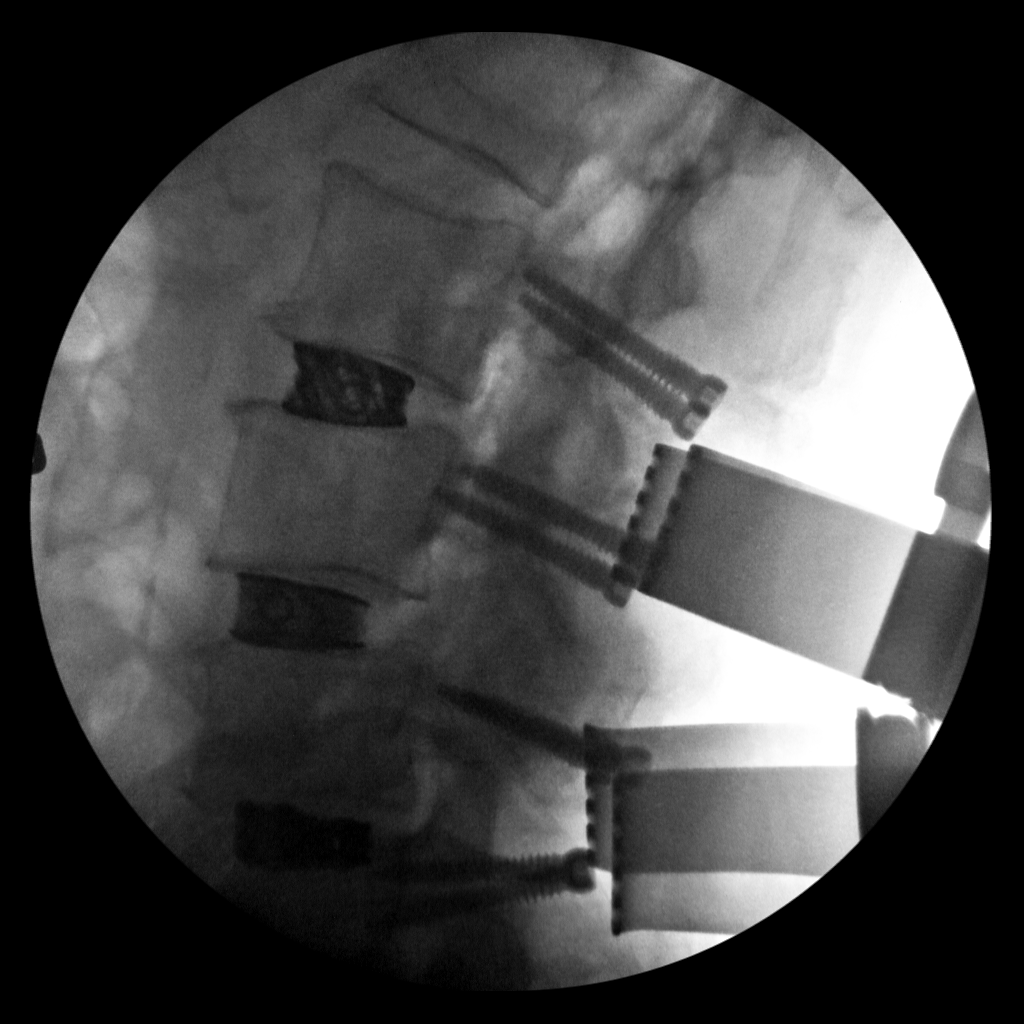
[im 3/4]
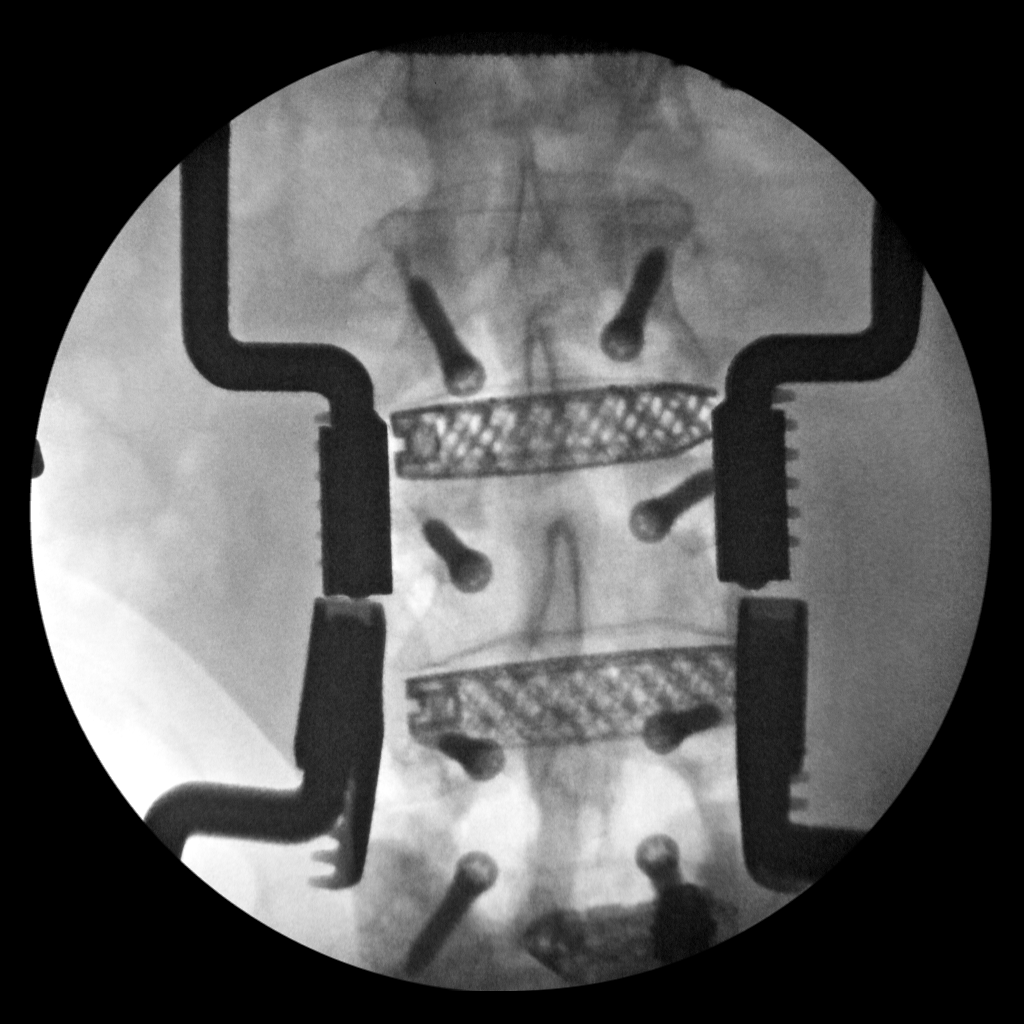
[im 4/4]
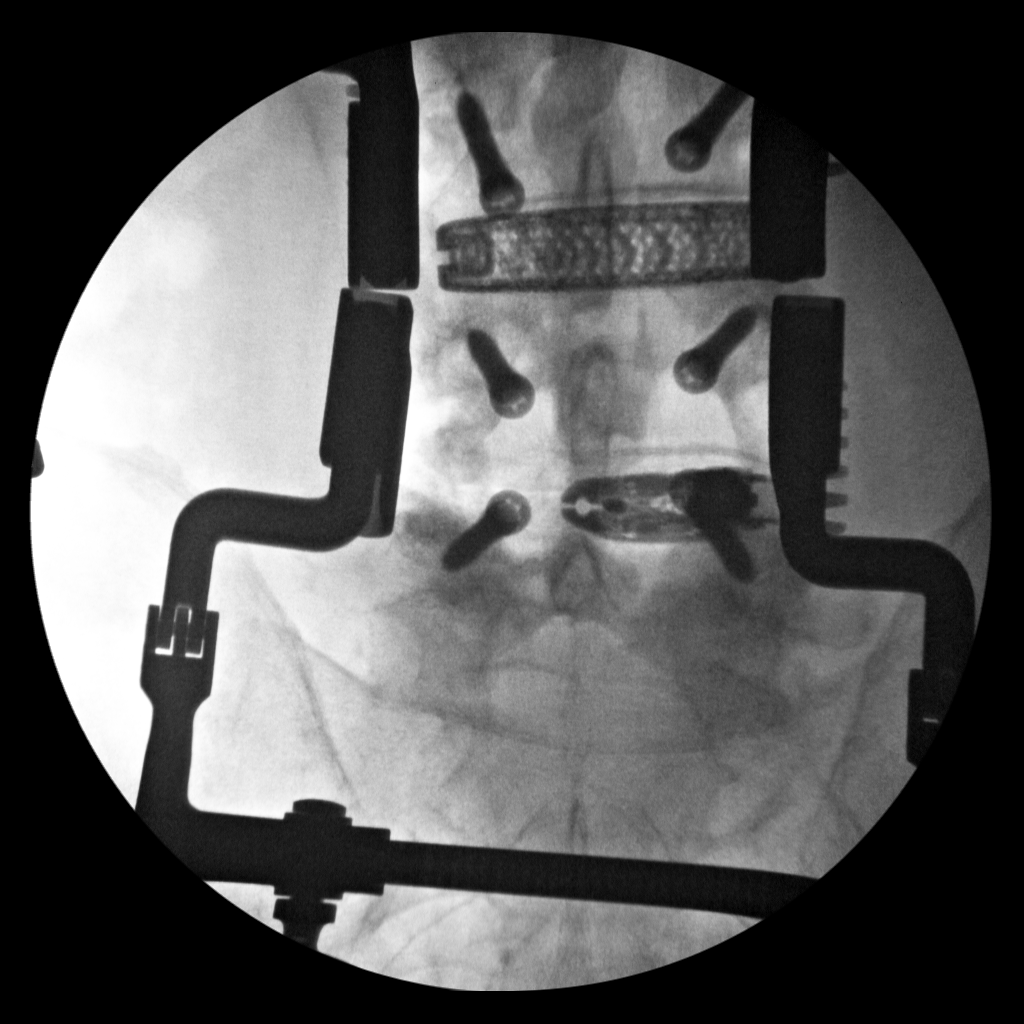

[4 of 4 positions shown; findings below may reference images not displayed]

FINDINGS: Four low resolution intraoperative spot views of the lumbar spine.
Total fluoroscopy time was 6 minutes 3 seconds. Images demonstrate
placement of fixating screws and interbody devices within the lumbar
spine.
IMPRESSION: Intraoperative fluoroscopic assistance provided during lumbar spine
surgery.

## 2019-06-24 DIAGNOSIS — N183 Chronic kidney disease, stage 3 unspecified: Secondary | ICD-10-CM | POA: Diagnosis not present

## 2019-06-24 DIAGNOSIS — M5116 Intervertebral disc disorders with radiculopathy, lumbar region: Secondary | ICD-10-CM | POA: Diagnosis not present

## 2019-06-24 DIAGNOSIS — G894 Chronic pain syndrome: Secondary | ICD-10-CM | POA: Diagnosis not present

## 2019-06-24 DIAGNOSIS — M5440 Lumbago with sciatica, unspecified side: Secondary | ICD-10-CM | POA: Diagnosis not present

## 2019-06-24 DIAGNOSIS — I10 Essential (primary) hypertension: Secondary | ICD-10-CM | POA: Diagnosis not present

## 2019-06-24 DIAGNOSIS — E1122 Type 2 diabetes mellitus with diabetic chronic kidney disease: Secondary | ICD-10-CM | POA: Diagnosis not present

## 2019-06-24 DIAGNOSIS — Z981 Arthrodesis status: Secondary | ICD-10-CM | POA: Diagnosis not present

## 2019-06-24 DIAGNOSIS — G8929 Other chronic pain: Secondary | ICD-10-CM | POA: Diagnosis not present

## 2019-06-24 DIAGNOSIS — Z7901 Long term (current) use of anticoagulants: Secondary | ICD-10-CM | POA: Diagnosis not present

## 2019-06-24 DIAGNOSIS — I129 Hypertensive chronic kidney disease with stage 1 through stage 4 chronic kidney disease, or unspecified chronic kidney disease: Secondary | ICD-10-CM | POA: Diagnosis not present

## 2019-06-24 DIAGNOSIS — Z7984 Long term (current) use of oral hypoglycemic drugs: Secondary | ICD-10-CM | POA: Diagnosis not present

## 2019-06-24 DIAGNOSIS — G4733 Obstructive sleep apnea (adult) (pediatric): Secondary | ICD-10-CM | POA: Diagnosis not present

## 2019-06-24 DIAGNOSIS — Z79899 Other long term (current) drug therapy: Secondary | ICD-10-CM | POA: Diagnosis not present

## 2019-06-24 DIAGNOSIS — I480 Paroxysmal atrial fibrillation: Secondary | ICD-10-CM | POA: Diagnosis not present

## 2019-06-24 DIAGNOSIS — E1140 Type 2 diabetes mellitus with diabetic neuropathy, unspecified: Secondary | ICD-10-CM | POA: Diagnosis not present

## 2019-06-24 DIAGNOSIS — M5416 Radiculopathy, lumbar region: Secondary | ICD-10-CM | POA: Diagnosis not present

## 2019-06-24 DIAGNOSIS — K219 Gastro-esophageal reflux disease without esophagitis: Secondary | ICD-10-CM | POA: Diagnosis not present

## 2019-06-24 DIAGNOSIS — M48062 Spinal stenosis, lumbar region with neurogenic claudication: Secondary | ICD-10-CM | POA: Diagnosis not present

## 2019-06-25 ENCOUNTER — Other Ambulatory Visit: Payer: Self-pay

## 2019-06-25 ENCOUNTER — Emergency Department
Admission: EM | Admit: 2019-06-25 | Discharge: 2019-06-25 | Disposition: A | Discharge status: Home / Self Care | Payer: Medicare Other | Attending: Student in an Organized Health Care Education/Training Program | Admitting: Student in an Organized Health Care Education/Training Program

## 2019-06-25 ENCOUNTER — Ambulatory Visit: Payer: Self-pay

## 2019-06-25 ENCOUNTER — Emergency Department: Payer: Medicare Other

## 2019-06-25 DIAGNOSIS — K5900 Constipation, unspecified: Secondary | ICD-10-CM | POA: Diagnosis not present

## 2019-06-25 DIAGNOSIS — E1122 Type 2 diabetes mellitus with diabetic chronic kidney disease: Secondary | ICD-10-CM | POA: Diagnosis not present

## 2019-06-25 DIAGNOSIS — Z9889 Other specified postprocedural states: Secondary | ICD-10-CM | POA: Insufficient documentation

## 2019-06-25 DIAGNOSIS — T8189XA Other complications of procedures, not elsewhere classified, initial encounter: Secondary | ICD-10-CM | POA: Diagnosis not present

## 2019-06-25 DIAGNOSIS — R110 Nausea: Secondary | ICD-10-CM | POA: Diagnosis not present

## 2019-06-25 DIAGNOSIS — Z9682 Presence of neurostimulator: Secondary | ICD-10-CM | POA: Diagnosis not present

## 2019-06-25 DIAGNOSIS — N183 Chronic kidney disease, stage 3 unspecified: Secondary | ICD-10-CM | POA: Insufficient documentation

## 2019-06-25 DIAGNOSIS — I129 Hypertensive chronic kidney disease with stage 1 through stage 4 chronic kidney disease, or unspecified chronic kidney disease: Secondary | ICD-10-CM | POA: Insufficient documentation

## 2019-06-25 DIAGNOSIS — Z7984 Long term (current) use of oral hypoglycemic drugs: Secondary | ICD-10-CM | POA: Insufficient documentation

## 2019-06-25 LAB — CBC
HCT: 39.7 % (ref 39.0–52.0)
Hemoglobin: 13.2 g/dL (ref 13.0–17.0)
MCH: 30.2 pg (ref 26.0–34.0)
MCHC: 33.2 g/dL (ref 30.0–36.0)
MCV: 90.8 fL (ref 80.0–100.0)
Platelets: 196 10*3/uL (ref 150–400)
RBC: 4.37 MIL/uL (ref 4.22–5.81)
RDW: 13.9 % (ref 11.5–15.5)
WBC: 12.2 10*3/uL — ABNORMAL HIGH (ref 4.0–10.5)
nRBC: 0 % (ref 0.0–0.2)

## 2019-06-25 LAB — URINALYSIS, COMPLETE (UACMP) WITH MICROSCOPIC
Bacteria, UA: NONE SEEN
Bilirubin Urine: NEGATIVE
Glucose, UA: 150 mg/dL — AB
Hgb urine dipstick: NEGATIVE
Ketones, ur: NEGATIVE mg/dL
Leukocytes,Ua: NEGATIVE
Nitrite: NEGATIVE
Protein, ur: 30 mg/dL — AB
Specific Gravity, Urine: 1.018 (ref 1.005–1.030)
Squamous Epithelial / HPF: NONE SEEN (ref 0–5)
pH: 7 (ref 5.0–8.0)

## 2019-06-25 LAB — BASIC METABOLIC PANEL
Anion gap: 13 (ref 5–15)
BUN: 20 mg/dL (ref 8–23)
CO2: 27 mmol/L (ref 22–32)
Calcium: 10.2 mg/dL (ref 8.9–10.3)
Chloride: 98 mmol/L (ref 98–111)
Creatinine, Ser: 1.06 mg/dL (ref 0.61–1.24)
GFR calc Af Amer: >60 mL/min (ref >60)
GFR calc non Af Amer: >60 mL/min (ref >60)
Glucose, Bld: 212 mg/dL — ABNORMAL HIGH (ref 70–99)
Potassium: 4.1 mmol/L (ref 3.5–5.1)
Sodium: 138 mmol/L (ref 135–145)

## 2019-06-25 LAB — GLUCOSE, CAPILLARY: Glucose-Capillary: 183 mg/dL — ABNORMAL HIGH (ref 70–99)

## 2019-06-25 MED ORDER — SODIUM CHLORIDE 0.9 % IV BOLUS
500.0000 mL | Freq: Once | INTRAVENOUS | Status: AC
  Administered 2019-06-25: 500 mL via INTRAVENOUS

## 2019-06-25 MED ORDER — POLYETHYLENE GLYCOL 3350 17 G PO PACK
17.0000 g | PACK | Freq: Every day | ORAL | 0 refills | Status: DC
Start: 2019-06-25 — End: 2019-07-30

## 2019-06-25 MED ORDER — LACTULOSE 10 GM/15ML PO SOLN
30.0000 g | Freq: Once | ORAL | Status: AC
  Administered 2019-06-25: 30 g via ORAL
  Filled 2019-06-25: qty 60

## 2019-06-25 MED ORDER — ONDANSETRON 4 MG PO TBDP
4.0000 mg | ORAL_TABLET | Freq: Once | ORAL | Status: AC
  Administered 2019-06-25: 4 mg via ORAL
  Filled 2019-06-25: qty 1

## 2019-06-25 MED ORDER — METOCLOPRAMIDE HCL 5 MG/ML IJ SOLN
10.0000 mg | Freq: Once | INTRAMUSCULAR | Status: AC
  Administered 2019-06-25: 10 mg via INTRAVENOUS
  Filled 2019-06-25: qty 2

## 2019-06-25 MED ORDER — SORBITOL 70 % SOLN
960.0000 mL | TOPICAL_OIL | Freq: Once | ORAL | Status: AC
  Administered 2019-06-25: 960 mL via RECTAL
  Filled 2019-06-25: qty 473

## 2019-06-25 MED ORDER — ONDANSETRON HCL 4 MG/2ML IJ SOLN
INTRAMUSCULAR | Status: AC
  Filled 2019-06-25: qty 2

## 2019-06-25 MED ORDER — ONDANSETRON 4 MG PO TBDP
4.0000 mg | ORAL_TABLET | Freq: Three times a day (TID) | ORAL | 0 refills | Status: DC | PRN
Start: 2019-06-25 — End: 2019-12-11

## 2019-06-25 NOTE — ED Notes (Signed)
Pt transported to xray 

## 2019-06-25 NOTE — ED Notes (Signed)
564mL of SMOG enema administered. Pt currently on bedside commode.

## 2019-06-25 NOTE — Telephone Encounter (Signed)
Attempted to contact patient. Left VM for patient to call back for constipation post op.

## 2019-06-25 NOTE — Telephone Encounter (Signed)
Pt called in c/o being constipated.  He has not had a BM in 3 days plus he had surgery yesterday at [facility].  Today he has used 2 suppositories and 2 Fleets enemas without any results today.     His last BM was Saturday.   "I don't usually have a problem with constipation".   "I do have intermittent diarrhea since having Salmonella last year".  He is c/o his abd being bloated.   "I think I'm going on to the ER but I wanted to call first".   I instructed him to go on to the ED because if he is impacted there is not anything that can be done in the office.  He was agreeable and is going on to the ED now.   He thanked me for my help.  I forwarded my triage notes to Marnee Guarneri, NP office.   Reason for Disposition . [1] Rectal pain or fullness from fecal impaction (rectum full of stool) AND [2] NOT better after SITZ bath, suppository or enema    Had surgery yesterday.  No BM in 3 days after suppositories and enemas.  Answer Assessment - Initial Assessment Questions 1. STOOL PATTERN OR FREQUENCY: "How often do you pass bowel movements (BMs)?"  (Normal range: tid to q 3 days)  "When was the last BM passed?"       I've not had a BM for 3 days.   I had surgery yesterday at [facility].   The anesthesia I think made me constipated. I had diarrhea before surgery on Saturday so I took one Imodium.   It stopped the diarrhea.   I had Salmellia for 5 days last year with it.   So since then I have periodic diarrhea.   2. STRAINING: "Do you have to strain to have a BM?"      Yes but nothing is happening. 3. RECTAL PAIN: "Does your rectum hurt when the stool comes out?" If so, ask: "Do you have hemorrhoids? How bad is the pain?"  (Scale 1-10; or mild, moderate, severe)     Yes I'm having rectal pain.    I also have an over active bladder.  That's not helping this situation.     4. STOOL COMPOSITION: "Are the stools hard?"      Saturday was last BM.   It was normal. 5. BLOOD ON STOOLS: "Has there been any blood on the  toilet tissue or on the surface of the BM?" If so, ask: "When was the last time?"      No 6. CHRONIC CONSTIPATION: "Is this a new problem for you?"  If no, ask: "How long have you had this problem?" (days, weeks, months)      On and off. 7. CHANGES IN DIET: "Have there been any recent changes in your diet?"      Surgery yesterday. 8. MEDICATIONS: "Have you been taking any new medications?"     I was given antibiotics after surgery.   9. LAXATIVES: "Have you been using any laxatives or enemas?"  If yes, ask "What, how often, and when was the last time?"     I tried 2 suppositories and two enemas today.  I did not pass anything. 10. CAUSE: "What do you think is causing the constipation?"        Surgery.   I don't usually have constipation. 11. OTHER SYMPTOMS: "Do you have any other symptoms?" (e.g., abdominal pain, fever, vomiting)       My abd  is bloated and distended.    12. PREGNANCY: "Is there any chance you are pregnant?" "When was your last menstrual period?"       N/A  Protocols used: CONSTIPATION-A-AH

## 2019-06-25 NOTE — ED Provider Notes (Signed)
[facility] Emergency Department Provider Note    First MD Initiated Contact with Patient 06/25/19 1554     (approximate)  I have reviewed the triage vital signs and the nursing notes.   HISTORY  Chief Complaint Constipation    HPI Sean Reyes. is a 79 y.o. male status post recent spinal stimulator procedure presents to the ER for evaluation of feelings of constipation since the procedure.  Denies any focal abdominal pain.  States he does feel like he needs to move his bowels but is been having trouble.  Tried Colace as well as enemas without any improvement.  Denies any fevers.  Denies any chest pain or shortness of breath.  Has had some intermittent nausea since the procedure.  Denies any dysuria.    Past Medical History:  Diagnosis Date  . Anemia   . Anxiety   . Arthritis   . Atrial fibrillation ([place])   . Cataracts, bilateral   . Complication of anesthesia    pt reports low BP's after surgery at [facility] and difficulty awakening  . Depression   . Diabetes ([place])    dx 6-8 yrs ago  . Dysrhythmia    a-fib  . GERD (gastroesophageal reflux disease)    OCC TAKES ALKA SELTZER  . History of kidney stones    10-15 yrs ago  . 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 Hyperlipidemia   . Hypertension   . Nocturia   . S/P ablation of atrial fibrillation    Ablative therapy  . Sleep apnea    CPAP   . Tachycardia, unspecified    Family History  Problem Relation Age of Onset  . Brain cancer Mother   . Kidney disease Neg Hx   . Prostate cancer Neg Hx   . Kidney cancer Neg Hx   . Bladder Cancer Neg Hx    Past Surgical History:  Procedure Laterality Date  . ABLATION    . ANTERIOR LAT LUMBAR FUSION N/A 06/27/2017   Procedure: Anterior Lateral Lumbar Interbody  Fusion - Lumbar Two-Lumbar Three - Lumbar Three-Lumbar Four, Posterior Lumbar Interbody Fusion Lumbar Four- Five;  Surgeon: Kary Kos, MD;  Location: [place];  Service:  Neurosurgery;  Laterality: N/A;  Anterior Lateral Lumbar Interbody  Fusion - Lumbar Two-Lumbar Three - Lumbar Three-Lumbar Four, Posterior Lumbar Interbody Fusion Lumbar Four- Five  . BACK SURGERY    . CARDIOVERSION N/A 08/29/2018   Procedure: CARDIOVERSION;  Surgeon: Corey Skains, MD;  Location: [facility] ORS;  Service: Cardiovascular;  Laterality: N/A;  . CARDIOVERSION N/A 09/24/2018   Procedure: CARDIOVERSION;  Surgeon: Corey Skains, MD;  Location: [facility] OR[facility];  Service: Cardiovascular;  Laterality: N/A;  . COLONOSCOPY WITH PROPOFOL N/A 10/05/2015   Procedure: COLONOSCOPY WITH PROPOFOL;  Surgeon: Lollie Sails, MD;  Location: [facility] ENDOSCOPY;  Service: Endoscopy;  Laterality: N/A;  . ESOPHAGOGASTRODUODENOSCOPY (EGD) WITH PROPOFOL N/A 04/01/2018   Procedure: ESOPHAGOGASTRODUODENOSCOPY (EGD) WITH PROPOFOL;  Surgeon: Lollie Sails, MD;  Location[facility]COPY;  Service: Endoscopy;  Laterality: N/A;  . HERNIA REPAIR    . JOINT REPLACEMENT Bilateral    hips  RT+  LEFT X2   . LUMBAR LAMINECTOMY/DECOMPRESSION MICRODISCECTOMY Left 09/13/2016   Procedure: Microdiscectomy - Lumbar two-three,  Lumbar three- - left;  Surgeon: Kary Kos, MD;  Location: [place];  Service: Neurosurgery;  Laterality: Left;  . TONSILLECTOMY     Patient Active Problem List   Diagnosis Date Noted  .  CKD (chronic kidney disease) stage 3, GFR 30-59 ml/min 01/19/2019  . Acquired thrombophilia ([place]) 01/19/2019  . 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 Postlaminectomy syndrome, lumbar region 01/16/2019  . History of fusion of lumbar spine (L2-L5) 01/16/2019  . Chronic radicular lumbar pain 01/16/2019  . Lumbar radiculopathy 01/16/2019  . HNP (herniated nucleus pulposus), lumbar 04/29/2018  . Advanced care planning/counseling discussion 09/28/2016  . Spinal stenosis, lumbar region, 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/13/2016  . Low testosterone 10/07/2015  . Hyperlipidemia associated with type 2 diabetes mellitus  ([place]) 07/14/2015  . Anemia 06/30/2015  . 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 OSA (obstructive sleep apnea) 03/23/2015  . Type 2 diabetes mellitus with diabetic chronic kidney disease ([place]) 01/05/2015  . Lumbar degenerative disc disease 12/23/2014  . Hypertension associated with diabetes ([place]) 09/28/2014  . 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 Arthritis 10/20/2011  . H/O prior ablation treatment 10/19/2011  . Paroxysmal atrial fibrillation ([place]) 10/19/2011      Prior to Admission medications   Medication Sig Start Date End Date Taking? Authorizing Provider  acetaminophen (TYLENOL) 500 MG tablet Take 500 mg by mouth every 6 (six) hours as needed (pain).    [provider]  amLODipine (NORVASC) 5 MG tablet Take 1 tablet (5 mg total) by mouth daily. 02/05/19   [name], Henrine Screws T, NP  benazepril (LOTENSIN) 40 MG tablet Take 1 tablet (40 mg total) by mouth daily. 11/04/18   [name], MD  cetirizine (ZYRTEC) 10 MG tablet Take 1 tablet (10 mg total) by mouth daily. 03/06/19   Volney American, PA-C  ELIQUIS 5 MG TABS tablet Take 1 tablet (5 mg total) by mouth 2 (two) times daily. 01/20/19   [name], Henrine Screws T, NP  finasteride (PROSCAR) 5 MG tablet Take 1 tablet (5 mg total) by mouth daily. 04/24/19   [name], Henrine Screws T, NP  fluticasone (FLONASE) 50 MCG/ACT nasal spray Place 2 sprays into both nostrils 2 (two) times daily. 03/06/19   Volney American, PA-C  metFORMIN (GLUCOPHAGE-XR) 500 MG 24 hr tablet Take 2 tablets (1,000 mg total) by mouth 2 (two) times daily. 06/26/18   [name], MD  metoprolol succinate (TOPROL-XL) 25 MG 24 hr tablet Take 2 tablets by mouth 2 (two) times daily. 03/31/19   [provider]  ondansetron (ZOFRAN ODT) 4 MG disintegrating tablet Take 1 tablet (4 mg total) by mouth every 8 (eight) hours as needed for nausea or vomiting. 06/25/19   Merlyn Lot, MD  pantoprazole (PROTONIX)  40 MG tablet Take 40 mg by mouth at bedtime.     [provider]  polyethylene glycol (MIRALAX / GLYCOLAX) 17 g packet Take 17 g by mouth daily. Mix one tablespoon with 8oz of your favorite juice or water every day until you are having soft formed stools. Then start taking once daily if you didn't have a stool the day before. 06/25/19   Merlyn Lot, MD  propafenone (RYTHMOL SR) 425 MG 12 hr capsule Take 425 mg by mouth 2 (two) times daily.    [provider]    Allergies Levaquin [levofloxacin in d5w], Shellfish allergy, Amiodarone, and Adhesive [tape]    Social History Social History   Tobacco Use  . Smoking status: Never Smoker  . Smokeless tobacco: Never Used  Substance Use Topics  . Alcohol use: No  . Drug use: No    Review of Systems Patient denies headaches, rhinorrhea, blurry vision, numbness, shortness of breath,  chest pain, edema, cough, abdominal pain, nausea, vomiting, diarrhea, dysuria, fevers, rashes or hallucinations unless otherwise stated above in HPI. ____________________________________________   PHYSICAL EXAM:  VITAL SIGNS: Vitals:   06/25/19 1519  BP: (!) 144/86  Pulse: 72  Resp: 16  Temp: 98 F (36.7 C)  SpO2: 98%    Constitutional: Alert and oriented.  Eyes: Conjunctivae are normal.  Head: Atraumatic. Nose: No congestion/rhinnorhea. Mouth/Throat: Mucous membranes are moist.   Neck: No stridor. Painless ROM.  Cardiovascular: Normal rate, regular rhythm. Grossly normal heart sounds.  Good peripheral circulation. Respiratory: Normal respiratory effort.  No retractions. Lungs CTAB. Gastrointestinal: Soft and nontender in all four quadrants. No distention. No abdominal bruits. No CVA tenderness. Genitourinary: Enlarged prostate is nontender no fluctuance.  No hard formed stool in the rectal vault.  Does have good rectal tone no saddle anesthesia Musculoskeletal: No lower extremity tenderness nor edema.  No joint  effusions. Neurologic:  Normal speech and language. No gross focal neurologic deficits are appreciated. No facial droop Skin:  Skin is warm, dry and intact. No rash noted. Psychiatric: Mood and affect are normal. Speech and behavior are normal.  ____________________________________________   LABS (all labs ordered are listed, but only abnormal results are displayed)  Results for orders placed or performed during the hospital encounter of 06/25/19 (from the past 24 hour(s))  CBC     Status: Abnormal   Collection Time: 06/25/19  3:28 PM  Result Value Ref Range   WBC 12.2 (H) 4.0 - 10.5 K/uL   RBC 4.37 4.22 - 5.81 MIL/uL   Hemoglobin 13.2 13.0 - 17.0 g/dL   HCT 39.7 39.0 - 52.0 %   MCV 90.8 80.0 - 100.0 fL   MCH 30.2 26.0 - 34.0 pg   MCHC 33.2 30.0 - 36.0 g/dL   RDW 13.9 11.5 - 15.5 %   Platelets 196 150 - 400 K/uL   nRBC 0.0 0.0 - 0.2 %  Basic metabolic panel     Status: Abnormal   Collection Time: 06/25/19  3:28 PM  Result Value Ref Range   Sodium 138 135 - 145 mmol/L   Potassium 4.1 3.5 - 5.1 mmol/L   Chloride 98 98 - 111 mmol/L   CO2 27 22 - 32 mmol/L   Glucose, Bld 212 (H) 70 - 99 mg/dL   BUN 20 8 - 23 mg/dL   Creatinine, Ser 1.06 0.61 - 1.24 mg/dL   Calcium 10.2 8.9 - 10.3 mg/dL   GFR calc non Af Amer >60 >60 mL/min   GFR calc Af Amer >60 >60 mL/min   Anion gap 13 5 - 15  Urinalysis, Complete w Microscopic     Status: Abnormal   Collection Time: 06/25/19  4:27 PM  Result Value Ref Range   Color, Urine YELLOW (A) YELLOW   APPearance CLEAR (A) CLEAR   Specific Gravity, Urine 1.018 1.005 - 1.030   pH 7.0 5.0 - 8.0   Glucose, UA 150 (A) NEGATIVE mg/dL   Hgb urine dipstick NEGATIVE NEGATIVE   Bilirubin Urine NEGATIVE NEGATIVE   Ketones, ur NEGATIVE NEGATIVE mg/dL   Protein, ur 30 (A) NEGATIVE mg/dL   Nitrite NEGATIVE NEGATIVE   Leukocytes,Ua NEGATIVE NEGATIVE   RBC / HPF 0-5 0 - 5 RBC/hpf   WBC, UA 0-5 0 - 5 WBC/hpf   Bacteria, UA NONE SEEN NONE SEEN   Squamous  Epithelial / LPF NONE SEEN 0 - 5   Mucus PRESENT   Glucose, capillary     Status:  Abnormal   Collection Time: 06/25/19  5:23 PM  Result Value Ref Range   Glucose-Capillary 183 (H) 70 - 99 mg/dL   ____________________________________________ ____________________________________________  RADIOLOGY  I personally reviewed all radiographic images ordered to evaluate for the above acute complaints and reviewed radiology reports and findings.  These findings were personally discussed with the patient.  Please see medical record for radiology report.  ____________________________________________   PROCEDURES  Procedure(s) performed:  Procedures    Critical Care performed: no ____________________________________________   INITIAL IMPRESSION / ASSESSMENT AND PLAN / ED COURSE  Pertinent labs & imaging results that were available during my care of the patient were reviewed by me and considered in my medical decision making (see chart for details).   DDX: Constipation, postop ileus, SBO, UTI, cord impingement  Sean Reyes. is a 79 y.o. who presents to the ED with symptoms as described above.  Patient nontoxic-appearing in no acute distress.  Is afebrile hemodynamically stable.  Mild leukocytosis but believe appropriate in the post op period.  surical site is c/d/i.  Exam is reassuring.  Rectal exam did not show any significant stool to suggest fecal impaction given his recent postop state had initially planned on CT imaging to exclude obstruction  Clinical Course as of Jun 25 2047  Wed Jun 25, 2019  1759 Unable to obtain CT imaging due to post no new device and concern for complication with CT per CT techs. His abdominal exam is otherwise benign. Plain x-ray does not show any evidence of obstructive pattern. Was unable to palpate stool to suggest fecal impaction but does have moderate stool burden on x-ray. Will order lactulose as well as enema. I suspect this is all related to  postoperative anesthesia complication. He is otherwise nontoxic-appearing. Has good tone.   [PR]  2022 Patient is tolerating p.o. after antiemetic.    [PR]  2044 Patient reassessed.  Repeat abdominal exam is soft benign.  Patient is tolerating p.o.  Did move his bowels.  Feels significantly improved.  Does appear appropriate for outpatient follow-up.   [PR]    Clinical Course User Index [PR] Merlyn Lot, MD    The patient was evaluated in Emergency Department today for the symptoms described in the history of present illness. He/she was evaluated in the context of the global COVID-19 pandemic, which necessitated consideration that the patient might be at risk for infection with the SARS-CoV-2 virus that causes COVID-19. Institutional protocols and algorithms that pertain to the evaluation of patients at risk for COVID-19 are in a state of rapid change based on information released by regulatory bodies including the CDC and federal and state organizations. These policies and algorithms were followed during the patient's care in the ED.  As part of my medical decision making, I reviewed the following data within the [place] notes reviewed and incorporated, Labs reviewed, notes from prior ED visits and [place] Controlled Substance Database   ____________________________________________   FINAL CLINICAL IMPRESSION(S) / ED DIAGNOSES  Final diagnoses:  Constipation, unspecified constipation type      NEW MEDICATIONS STARTED DURING THIS VISIT:  New Prescriptions   ONDANSETRON (ZOFRAN ODT) 4 MG DISINTEGRATING TABLET    Take 1 tablet (4 mg total) by mouth every 8 (eight) hours as needed for nausea or vomiting.   POLYETHYLENE GLYCOL (MIRALAX / GLYCOLAX) 17 G PACKET    Take 17 g by mouth daily. Mix one tablespoon with 8oz of your favorite juice or water every day until  you are having soft formed stools. Then start taking once daily if you didn't have a stool the day  before.     Note:  This document was prepared using Dragon voice recognition software and may include unintentional dictation errors.    Merlyn Lot, MD 06/25/19 2049

## 2019-06-25 NOTE — Discharge Instructions (Addendum)

## 2019-06-25 NOTE — ED Triage Notes (Signed)
Pt went to [facility] yesterday to have spinal stimulator put in and since then has been constipated. He has 2 enemas and 2 suppositories with no success.

## 2019-06-25 NOTE — Telephone Encounter (Signed)
Agree with plan for ER, especially since recent surgery performed.

## 2019-06-25 NOTE — ED Notes (Signed)
Pt wife come to front desk saying patient was vomiting. This RN went to room and patient had projectile vomited a green like throw up across the floor. Pt given po zofran and patient cleaned up by this RN and Gabby, tech. Pt more comfortable in bed at this time, no further needs. Pt says he no longer feels nauseas.

## 2019-06-28 DIAGNOSIS — M5416 Radiculopathy, lumbar region: Secondary | ICD-10-CM | POA: Diagnosis not present

## 2019-06-28 DIAGNOSIS — Z20822 Contact with and (suspected) exposure to covid-19: Secondary | ICD-10-CM | POA: Diagnosis not present

## 2019-06-29 DIAGNOSIS — R338 Other retention of urine: Secondary | ICD-10-CM | POA: Diagnosis not present

## 2019-06-29 DIAGNOSIS — I129 Hypertensive chronic kidney disease with stage 1 through stage 4 chronic kidney disease, or unspecified chronic kidney disease: Secondary | ICD-10-CM | POA: Diagnosis not present

## 2019-06-29 DIAGNOSIS — E785 Hyperlipidemia, unspecified: Secondary | ICD-10-CM | POA: Diagnosis not present

## 2019-06-29 DIAGNOSIS — Z9889 Other specified postprocedural states: Secondary | ICD-10-CM | POA: Diagnosis not present

## 2019-06-29 DIAGNOSIS — E1165 Type 2 diabetes mellitus with hyperglycemia: Secondary | ICD-10-CM | POA: Diagnosis not present

## 2019-06-29 DIAGNOSIS — R3129 Other microscopic hematuria: Secondary | ICD-10-CM | POA: Diagnosis not present

## 2019-06-29 DIAGNOSIS — E1122 Type 2 diabetes mellitus with diabetic chronic kidney disease: Secondary | ICD-10-CM | POA: Diagnosis not present

## 2019-06-29 DIAGNOSIS — Z79899 Other long term (current) drug therapy: Secondary | ICD-10-CM | POA: Diagnosis not present

## 2019-06-29 DIAGNOSIS — N183 Chronic kidney disease, stage 3 unspecified: Secondary | ICD-10-CM | POA: Diagnosis not present

## 2019-06-29 DIAGNOSIS — Z5181 Encounter for therapeutic drug level monitoring: Secondary | ICD-10-CM | POA: Diagnosis not present

## 2019-06-29 DIAGNOSIS — K219 Gastro-esophageal reflux disease without esophagitis: Secondary | ICD-10-CM | POA: Diagnosis not present

## 2019-06-29 DIAGNOSIS — I4891 Unspecified atrial fibrillation: Secondary | ICD-10-CM | POA: Diagnosis not present

## 2019-06-29 DIAGNOSIS — R110 Nausea: Secondary | ICD-10-CM | POA: Diagnosis not present

## 2019-06-29 DIAGNOSIS — R531 Weakness: Secondary | ICD-10-CM | POA: Diagnosis not present

## 2019-06-29 DIAGNOSIS — N179 Acute kidney failure, unspecified: Secondary | ICD-10-CM | POA: Diagnosis not present

## 2019-07-01 DIAGNOSIS — E782 Mixed hyperlipidemia: Secondary | ICD-10-CM | POA: Diagnosis not present

## 2019-07-01 DIAGNOSIS — Z91013 Allergy to seafood: Secondary | ICD-10-CM | POA: Diagnosis not present

## 2019-07-01 DIAGNOSIS — E1122 Type 2 diabetes mellitus with diabetic chronic kidney disease: Secondary | ICD-10-CM | POA: Diagnosis not present

## 2019-07-01 DIAGNOSIS — G8929 Other chronic pain: Secondary | ICD-10-CM | POA: Diagnosis not present

## 2019-07-01 DIAGNOSIS — Z79899 Other long term (current) drug therapy: Secondary | ICD-10-CM | POA: Diagnosis not present

## 2019-07-01 DIAGNOSIS — N183 Chronic kidney disease, stage 3 unspecified: Secondary | ICD-10-CM | POA: Diagnosis not present

## 2019-07-01 DIAGNOSIS — Z981 Arthrodesis status: Secondary | ICD-10-CM | POA: Diagnosis not present

## 2019-07-01 DIAGNOSIS — G4733 Obstructive sleep apnea (adult) (pediatric): Secondary | ICD-10-CM | POA: Diagnosis not present

## 2019-07-01 DIAGNOSIS — R338 Other retention of urine: Secondary | ICD-10-CM | POA: Diagnosis not present

## 2019-07-01 DIAGNOSIS — I129 Hypertensive chronic kidney disease with stage 1 through stage 4 chronic kidney disease, or unspecified chronic kidney disease: Secondary | ICD-10-CM | POA: Diagnosis not present

## 2019-07-01 DIAGNOSIS — I480 Paroxysmal atrial fibrillation: Secondary | ICD-10-CM | POA: Diagnosis not present

## 2019-07-01 DIAGNOSIS — Z7901 Long term (current) use of anticoagulants: Secondary | ICD-10-CM | POA: Diagnosis not present

## 2019-07-01 DIAGNOSIS — K5900 Constipation, unspecified: Secondary | ICD-10-CM | POA: Diagnosis not present

## 2019-07-01 DIAGNOSIS — D6869 Other thrombophilia: Secondary | ICD-10-CM | POA: Diagnosis not present

## 2019-07-01 DIAGNOSIS — M5416 Radiculopathy, lumbar region: Secondary | ICD-10-CM | POA: Diagnosis not present

## 2019-07-01 DIAGNOSIS — N401 Enlarged prostate with lower urinary tract symptoms: Secondary | ICD-10-CM | POA: Diagnosis not present

## 2019-07-01 DIAGNOSIS — G894 Chronic pain syndrome: Secondary | ICD-10-CM | POA: Diagnosis not present

## 2019-07-01 DIAGNOSIS — D631 Anemia in chronic kidney disease: Secondary | ICD-10-CM | POA: Diagnosis not present

## 2019-07-01 DIAGNOSIS — Z881 Allergy status to other antibiotic agents status: Secondary | ICD-10-CM | POA: Diagnosis not present

## 2019-07-01 DIAGNOSIS — Z7984 Long term (current) use of oral hypoglycemic drugs: Secondary | ICD-10-CM | POA: Diagnosis not present

## 2019-07-03 DIAGNOSIS — Z978 Presence of other specified devices: Secondary | ICD-10-CM | POA: Diagnosis not present

## 2019-07-03 DIAGNOSIS — R338 Other retention of urine: Secondary | ICD-10-CM | POA: Diagnosis not present

## 2019-07-03 DIAGNOSIS — Z79899 Other long term (current) drug therapy: Secondary | ICD-10-CM | POA: Diagnosis not present

## 2019-07-03 DIAGNOSIS — N3281 Overactive bladder: Secondary | ICD-10-CM | POA: Diagnosis not present

## 2019-07-03 DIAGNOSIS — R102 Pelvic and perineal pain: Secondary | ICD-10-CM | POA: Diagnosis not present

## 2019-07-03 DIAGNOSIS — R339 Retention of urine, unspecified: Secondary | ICD-10-CM | POA: Diagnosis not present

## 2019-07-08 HISTORY — PX: SPINAL CORD STIMULATOR INSERTION: SHX5378

## 2019-07-11 DIAGNOSIS — M5416 Radiculopathy, lumbar region: Secondary | ICD-10-CM | POA: Diagnosis not present

## 2019-07-18 DIAGNOSIS — N401 Enlarged prostate with lower urinary tract symptoms: Secondary | ICD-10-CM | POA: Diagnosis not present

## 2019-07-18 DIAGNOSIS — N138 Other obstructive and reflux uropathy: Secondary | ICD-10-CM | POA: Diagnosis not present

## 2019-07-27 DIAGNOSIS — Z79899 Other long term (current) drug therapy: Secondary | ICD-10-CM | POA: Diagnosis not present

## 2019-07-27 DIAGNOSIS — N390 Urinary tract infection, site not specified: Secondary | ICD-10-CM | POA: Diagnosis not present

## 2019-07-27 DIAGNOSIS — E1165 Type 2 diabetes mellitus with hyperglycemia: Secondary | ICD-10-CM | POA: Diagnosis not present

## 2019-07-27 DIAGNOSIS — E119 Type 2 diabetes mellitus without complications: Secondary | ICD-10-CM | POA: Diagnosis not present

## 2019-07-27 DIAGNOSIS — I10 Essential (primary) hypertension: Secondary | ICD-10-CM | POA: Diagnosis not present

## 2019-07-27 DIAGNOSIS — Z5181 Encounter for therapeutic drug level monitoring: Secondary | ICD-10-CM | POA: Diagnosis not present

## 2019-07-27 DIAGNOSIS — R310 Gross hematuria: Secondary | ICD-10-CM | POA: Diagnosis not present

## 2019-07-27 DIAGNOSIS — T83511A Infection and inflammatory reaction due to indwelling urethral catheter, initial encounter: Secondary | ICD-10-CM | POA: Diagnosis not present

## 2019-07-29 ENCOUNTER — Telehealth: Payer: Self-pay | Admitting: Nurse Practitioner

## 2019-07-29 NOTE — Telephone Encounter (Signed)
Pt was notified, verbalized and confirmed understanding.

## 2019-07-29 NOTE — Telephone Encounter (Signed)
Routing to provider. Can this be an in office?

## 2019-07-29 NOTE — Telephone Encounter (Signed)
Can change to in office visit and recommend he notify surgeon of this too.

## 2019-07-29 NOTE — Telephone Encounter (Signed)
Copied from [place] [phone]. Topic: Appointment Scheduling - Scheduling Inquiry for Clinic >> Jul 29, 2019 12:05 PM Lennox Solders wrote: Reason for CRM: pt would like in office appt with jolene. Pt is having no energy/fatigue since spine cord stimulator surgery on may 18-2021. Pt sch for virtual on 07-30-2019   Would this have to be a virtual or in office?Please advise.

## 2019-07-30 ENCOUNTER — Other Ambulatory Visit: Payer: Self-pay

## 2019-07-30 ENCOUNTER — Encounter: Payer: Self-pay | Admitting: Nurse Practitioner

## 2019-07-30 ENCOUNTER — Ambulatory Visit (INDEPENDENT_AMBULATORY_CARE_PROVIDER_SITE_OTHER): Payer: Medicare Other | Admitting: Nurse Practitioner

## 2019-07-30 VITALS — BP 128/68 | HR 67 | Temp 98.7°F | Wt 188.0 lb

## 2019-07-30 DIAGNOSIS — I152 Hypertension secondary to endocrine disorders: Secondary | ICD-10-CM

## 2019-07-30 DIAGNOSIS — R7989 Other specified abnormal findings of blood chemistry: Secondary | ICD-10-CM

## 2019-07-30 DIAGNOSIS — I10 Essential (primary) hypertension: Secondary | ICD-10-CM

## 2019-07-30 DIAGNOSIS — R5383 Other fatigue: Secondary | ICD-10-CM | POA: Diagnosis not present

## 2019-07-30 DIAGNOSIS — D519 Vitamin B12 deficiency anemia, unspecified: Secondary | ICD-10-CM | POA: Diagnosis not present

## 2019-07-30 DIAGNOSIS — E1159 Type 2 diabetes mellitus with other circulatory complications: Secondary | ICD-10-CM

## 2019-07-30 NOTE — Assessment & Plan Note (Signed)
S/P thoracic spinal cord stimulator placed initially on 07/01/2019 and then permanent placement on 07/07/2019.  Have recommended he reach out to surgeon to alert them of current symptoms as well.  Is coping with urinary retention post surgery and being followed by urology, currently on treatment for UTI.  Continue collaboration with urology.  Will obtain labs today to include CBC, CMP, TSH, iron, ferritin, and B12.  Mild decrease in HGB from baseline noted on recent ER labs, is s/p surgery.  Recommend increased rest at home and hydration.  Return to office in 4 weeks for follow-up, sooner if any worsening of symptoms.

## 2019-07-30 NOTE — Assessment & Plan Note (Addendum)
Ongoing with B12 level most recent labs 220.  Taking daily supplement, recommend he continue this.  CBC today, patient on Eliquis.  Will also check iron, ferritin, and B12 levels; as slight downward trend H/H noted on recent ER labs post surgery.

## 2019-07-30 NOTE — Patient Instructions (Signed)

## 2019-07-30 NOTE — Progress Notes (Signed)
BP 128/68   Pulse 67   Temp 98.7 F (37.1 C) (Oral)   Wt 188 lb (85.3 kg)   SpO2 98%   BMI 24.80 kg/m    Subjective:    Patient ID: Sean Hacker., male    DOB: 1940-05-12, 79 y.o.   MRN: [id number]  HPI: Sean Lea. is a 80 y.o. male  Chief Complaint  Patient presents with  . Fatigue    pt states he has been having fatigue since spinal cord surgery   FATIGUE Had thoracic spinal cord stimulator placed initially on 07/01/2019 and then permanent placement on 07/07/2019 -- reports the anesthesia "messed" him up.  Has had to go to ER x 2 due to urinary retention and UTI -- last 07/27/2019 and started on Macrobid.  Is going to urology -- has foley catheter in place for retention -- is going in the morning for follow-up and void trial.  At recent ER visit on 07/27/19 -- H/H 11.2/34.5, MCV 93, PLT 162  -- Glucose 196, GFR 65, CRT 1.1.  Does have B12 deficiency and is taking daily supplement -- last level May 2021 was 220.  He reports he took himself off Amlodipine and one Metoprolol daily due to some dizziness since surgery, reports he feels a little better with this. Recent A1C in March was 6.5%.  Duration:  weeks Severity: 7/10  Onset: suddent after surgery Context when symptoms started:  post surgery Symptoms improve with rest: yes  Depressive symptoms: no Stress/anxiety: no Insomnia: none Snoring: no Observed apnea by bed partner: no Daytime hypersomnolence:no Wakes feeling refreshed: yes History of sleep study: no Dysnea on exertion:  no Orthopnea/PND: no Chest pain: no Chronic cough: no Lower extremity edema: no Arthralgias:no Myalgias: no Weakness: no Rash: no  Relevant past medical, surgical, family and social history reviewed and updated as indicated. Interim medical history since our last visit reviewed. Allergies and medications reviewed and updated.  Review of Systems  Constitutional: Positive for fatigue. Negative for activity change, diaphoresis and  fever.  Respiratory: Negative for cough, chest tightness, shortness of breath and wheezing.   Cardiovascular: Negative for chest pain, palpitations and leg swelling.  Gastrointestinal: Negative.   Endocrine: Negative for cold intolerance, heat intolerance, polydipsia, polyphagia and polyuria.  Neurological: Negative.   Psychiatric/Behavioral: Negative.     Per HPI unless specifically indicated above     Objective:    BP 128/68   Pulse 67   Temp 98.7 F (37.1 C) (Oral)   Wt 188 lb (85.3 kg)   SpO2 98%   BMI 24.80 kg/m   Wt Readings from Last 3 Encounters:  07/30/19 188 lb (85.3 kg)  06/25/19 196 lb (88.9 kg)  03/05/19 194 lb (88 kg)    Physical Exam Vitals and nursing note reviewed.  Constitutional:      General: He is awake. He is not in acute distress.    Appearance: He is well-developed and well-groomed. He is not ill-appearing.  HENT:     Head: Normocephalic and atraumatic.     Right Ear: Hearing normal. No drainage.     Left Ear: Hearing normal. No drainage.  Eyes:     General: Lids are normal.        Right eye: No discharge.        Left eye: No discharge.     Conjunctiva/sclera: Conjunctivae normal.     Pupils: Pupils are equal, round, and reactive to light.  Neck:     Thyroid: No  thyromegaly.     Vascular: No carotid bruit.     Trachea: Trachea normal.  Cardiovascular:     Rate and Rhythm: Normal rate and regular rhythm.     Heart sounds: Normal heart sounds, S1 normal and S2 normal. No murmur heard.  No gallop.   Pulmonary:     Effort: Pulmonary effort is normal. No accessory muscle usage or respiratory distress.     Breath sounds: Normal breath sounds.  Abdominal:     General: Bowel sounds are normal.     Palpations: Abdomen is soft.  Musculoskeletal:        General: Normal range of motion.     Cervical back: Normal range of motion and neck supple.     Right lower leg: No edema.     Left lower leg: No edema.  Lymphadenopathy:     Cervical: No  cervical adenopathy.  Skin:    General: Skin is warm and dry.     Capillary Refill: Capillary refill takes less than 2 seconds.  Neurological:     Mental Status: He is alert and oriented to person, place, and time.     Deep Tendon Reflexes: Reflexes are normal and symmetric.  Psychiatric:        Attention and Perception: Attention normal.        Mood and Affect: Mood normal.        Speech: Speech normal.        Behavior: Behavior normal. Behavior is cooperative.        Thought Content: Thought content normal.     Results for orders placed or performed during the hospital encounter of 06/25/19  CBC  Result Value Ref Range   WBC 12.2 (H) 4.0 - 10.5 K/uL   RBC 4.37 4.22 - 5.81 MIL/uL   Hemoglobin 13.2 13.0 - 17.0 g/dL   HCT 39.7 39 - 52 %   MCV 90.8 80.0 - 100.0 fL   MCH 30.2 26.0 - 34.0 pg   MCHC 33.2 30.0 - 36.0 g/dL   RDW 13.9 11.5 - 15.5 %   Platelets 196 150 - 400 K/uL   nRBC 0.0 0.0 - 0.2 %  Basic metabolic panel  Result Value Ref Range   Sodium 138 135 - 145 mmol/L   Potassium 4.1 3.5 - 5.1 mmol/L   Chloride 98 98 - 111 mmol/L   CO2 27 22 - 32 mmol/L   Glucose, Bld 212 (H) 70 - 99 mg/dL   BUN 20 8 - 23 mg/dL   Creatinine, Ser 1.06 0.61 - 1.24 mg/dL   Calcium 10.2 8.9 - 10.3 mg/dL   GFR calc non Af Amer >60 >60 mL/min   GFR calc Af Amer >60 >60 mL/min   Anion gap 13 5 - 15  Urinalysis, Complete w Microscopic  Result Value Ref Range   Color, Urine YELLOW (A) YELLOW   APPearance CLEAR (A) CLEAR   Specific Gravity, Urine 1.018 1.005 - 1.030   pH 7.0 5.0 - 8.0   Glucose, UA 150 (A) NEGATIVE mg/dL   Hgb urine dipstick NEGATIVE NEGATIVE   Bilirubin Urine NEGATIVE NEGATIVE   Ketones, ur NEGATIVE NEGATIVE mg/dL   Protein, ur 30 (A) NEGATIVE mg/dL   Nitrite NEGATIVE NEGATIVE   Leukocytes,Ua NEGATIVE NEGATIVE   RBC / HPF 0-5 0 - 5 RBC/hpf   WBC, UA 0-5 0 - 5 WBC/hpf   Bacteria, UA NONE SEEN NONE SEEN   Squamous Epithelial / LPF NONE SEEN 0 - 5  Mucus PRESENT     Glucose, capillary  Result Value Ref Range   Glucose-Capillary 183 (H) 70 - 99 mg/dL      Assessment & Plan:   Problem List Items Addressed This Visit      Cardiovascular and Mediastinum   Hypertension associated with diabetes ([place])    Chronic, stable with BP at goal for age in office, even with his self medication changes at home post surgery.  Check A1C today.  Continue current medication regimen and collaboration with cardiology.  Recommend he continue to check BP at home at least 3 mornings a week and document.  Return in 4 weeks.      Relevant Orders   HgB A1c     Other   Anemia    Ongoing with B12 level most recent labs 220.  Taking daily supplement, recommend he continue this.  CBC today, patient on Eliquis.  Will also check iron, ferritin, and B12 levels; as slight downward trend H/H noted on recent ER labs post surgery.      Relevant Orders   CBC with Differential/Platelet   Vitamin B12   Iron, TIBC and Ferritin Panel   Fatigue - Primary    S/P thoracic spinal cord stimulator placed initially on 07/01/2019 and then permanent placement on 07/07/2019.  Have recommended he reach out to surgeon to alert them of current symptoms as well.  Is coping with urinary retention post surgery and being followed by urology, currently on treatment for UTI.  Continue collaboration with urology.  Will obtain labs today to include CBC, CMP, TSH, iron, ferritin, and B12.  Mild decrease in HGB from baseline noted on recent ER labs, is s/p surgery.  Recommend increased rest at home and hydration.  Return to office in 4 weeks for follow-up, sooner if any worsening of symptoms.      Relevant Orders   Comprehensive metabolic panel   TSH       Follow up plan: Return in about 4 weeks (around 08/27/2019) for Fatigue.

## 2019-07-30 NOTE — Assessment & Plan Note (Addendum)
Chronic, stable with BP at goal for age in office, even with his self medication changes at home post surgery.  Check A1C today.  Continue current medication regimen and collaboration with cardiology.  Recommend he continue to check BP at home at least 3 mornings a week and document.  Return in 4 weeks.

## 2019-07-31 DIAGNOSIS — N138 Other obstructive and reflux uropathy: Secondary | ICD-10-CM | POA: Diagnosis not present

## 2019-07-31 DIAGNOSIS — G903 Multi-system degeneration of the autonomic nervous system: Secondary | ICD-10-CM | POA: Diagnosis not present

## 2019-07-31 DIAGNOSIS — N401 Enlarged prostate with lower urinary tract symptoms: Secondary | ICD-10-CM | POA: Diagnosis not present

## 2019-07-31 LAB — CBC WITH DIFFERENTIAL/PLATELET
Basophils Absolute: 0 10*3/uL (ref 0.0–0.2)
Basos: 0 %
EOS (ABSOLUTE): 0.1 10*3/uL (ref 0.0–0.4)
Eos: 1 %
Hematocrit: 32.7 % — ABNORMAL LOW (ref 37.5–51.0)
Hemoglobin: 10.9 g/dL — ABNORMAL LOW (ref 13.0–17.7)
Immature Grans (Abs): 0 10*3/uL (ref 0.0–0.1)
Immature Granulocytes: 0 %
Lymphocytes Absolute: 2.1 10*3/uL (ref 0.7–3.1)
Lymphs: 35 %
MCH: 30.4 pg (ref 26.6–33.0)
MCHC: 33.3 g/dL (ref 31.5–35.7)
MCV: 91 fL (ref 79–97)
Monocytes Absolute: 0.7 10*3/uL (ref 0.1–0.9)
Monocytes: 12 %
Neutrophils Absolute: 3.2 10*3/uL (ref 1.4–7.0)
Neutrophils: 52 %
Platelets: 179 10*3/uL (ref 150–450)
RBC: 3.58 x10E6/uL — ABNORMAL LOW (ref 4.14–5.80)
RDW: 14.3 % (ref 11.6–15.4)
WBC: 6.1 10*3/uL (ref 3.4–10.8)

## 2019-07-31 LAB — COMPREHENSIVE METABOLIC PANEL
ALT: 10 IU/L (ref 0–44)
AST: 13 IU/L (ref 0–40)
Albumin/Globulin Ratio: 1.5 (ref 1.2–2.2)
Albumin: 4.1 g/dL (ref 3.7–4.7)
Alkaline Phosphatase: 71 IU/L (ref 48–121)
BUN/Creatinine Ratio: 17 (ref 10–24)
BUN: 21 mg/dL (ref 8–27)
Bilirubin Total: 0.3 mg/dL (ref 0.0–1.2)
CO2: 26 mmol/L (ref 20–29)
Calcium: 9.7 mg/dL (ref 8.6–10.2)
Chloride: 101 mmol/L (ref 96–106)
Creatinine, Ser: 1.21 mg/dL (ref 0.76–1.27)
GFR calc Af Amer: 67 mL/min/{1.73_m2} (ref >59)
GFR calc non Af Amer: 58 mL/min/{1.73_m2} — ABNORMAL LOW (ref >59)
Globulin, Total: 2.8 g/dL (ref 1.5–4.5)
Glucose: 136 mg/dL — ABNORMAL HIGH (ref 65–99)
Potassium: 4.5 mmol/L (ref 3.5–5.2)
Sodium: 139 mmol/L (ref 134–144)
Total Protein: 6.9 g/dL (ref 6.0–8.5)

## 2019-07-31 LAB — VITAMIN B12: Vitamin B-12: 692 pg/mL (ref 232–1245)

## 2019-07-31 LAB — IRON,TIBC AND FERRITIN PANEL
Ferritin: 72 ng/mL (ref 30–400)
Iron Saturation: 21 % (ref 15–55)
Iron: 72 ug/dL (ref 38–169)
Total Iron Binding Capacity: 350 ug/dL (ref 250–450)
UIBC: 278 ug/dL (ref 111–343)

## 2019-07-31 LAB — TSH: TSH: 5.11 u[IU]/mL — ABNORMAL HIGH (ref 0.450–4.500)

## 2019-07-31 NOTE — Addendum Note (Signed)
Addended by: Marnee Guarneri T on: 07/31/2019 08:23 AM   Modules accepted: Orders

## 2019-07-31 NOTE — Progress Notes (Signed)
Contacted via [place] afternoon [place].  Your labs have returned: - You continue to show some mild anemia, but B12 level has improved with supplement on board and your iron level is normal.  I would continue to take B12 and ensure lots of green leafy vegetables at home.  Please follow-up with surgeon to check on everything.  It is normal to have some mild anemia after surgery, but this should improve over time. - Kidney function and electrolytes are remaining stable - Thyroid level was slightly elevated on these labs, I have added a T4 and will check this.  If normal then we will recheck TSH and T4 next visit to see if they are trending upwards.  You have had similar mild elevation in past and then it returned to normal.  Any questions?  I do recommend follow-up with surgeon as soon as possible.  Thank you.

## 2019-08-01 DIAGNOSIS — R339 Retention of urine, unspecified: Secondary | ICD-10-CM | POA: Diagnosis not present

## 2019-08-01 LAB — SPECIMEN STATUS REPORT

## 2019-08-01 LAB — T4, FREE: Free T4: 1.3 ng/dL (ref 0.82–1.77)

## 2019-08-01 NOTE — Progress Notes (Signed)
Contacted via [place] morning Sean Reyes, your free T4 returned normal.  At this time we will recheck your TSH at next visit and no need to start medication.  Any questions?

## 2019-08-06 ENCOUNTER — Telehealth: Payer: Medicare Other | Admitting: General Practice

## 2019-08-06 ENCOUNTER — Ambulatory Visit: Payer: Self-pay | Admitting: General Practice

## 2019-08-06 DIAGNOSIS — E1122 Type 2 diabetes mellitus with diabetic chronic kidney disease: Secondary | ICD-10-CM

## 2019-08-06 DIAGNOSIS — N1831 Chronic kidney disease, stage 3a: Secondary | ICD-10-CM

## 2019-08-06 DIAGNOSIS — G8929 Other chronic pain: Secondary | ICD-10-CM

## 2019-08-06 DIAGNOSIS — E1121 Type 2 diabetes mellitus with diabetic nephropathy: Secondary | ICD-10-CM | POA: Diagnosis not present

## 2019-08-06 DIAGNOSIS — E1169 Type 2 diabetes mellitus with other specified complication: Secondary | ICD-10-CM

## 2019-08-06 DIAGNOSIS — I4891 Unspecified atrial fibrillation: Secondary | ICD-10-CM

## 2019-08-06 DIAGNOSIS — I10 Essential (primary) hypertension: Secondary | ICD-10-CM

## 2019-08-06 DIAGNOSIS — M5416 Radiculopathy, lumbar region: Secondary | ICD-10-CM

## 2019-08-06 DIAGNOSIS — I152 Hypertension secondary to endocrine disorders: Secondary | ICD-10-CM

## 2019-08-06 DIAGNOSIS — E1159 Type 2 diabetes mellitus with other circulatory complications: Secondary | ICD-10-CM | POA: Diagnosis not present

## 2019-08-06 DIAGNOSIS — E785 Hyperlipidemia, unspecified: Secondary | ICD-10-CM

## 2019-08-06 DIAGNOSIS — R5383 Other fatigue: Secondary | ICD-10-CM

## 2019-08-06 NOTE — Chronic Care Management (AMB) (Signed)
Chronic Care Management   Follow Up Note   08/06/2019 Name: Sean Reyes. MRN: [id number] DOB: Sep 13, 1940  Referred by: [name], NP Reason for referral : Chronic Care Management (Follow up: RNCM- Chronic Disease Management and Care Coordination Needs)   Sean Reyes. is a 79 y.o. year old male who is a primary care patient of [name], [name], NP. The CCM team was consulted for assistance with chronic disease management and care coordination needs.    Review of patient status, including review of consultants reports, relevant laboratory and other test results, and collaboration with appropriate care team members and the patient's provider was performed as part of comprehensive patient evaluation and provision of chronic care management services.    SDOH (Social Determinants of Health) assessments performed: Yes See Care Plan activities for detailed interventions related to [facility])     Outpatient Encounter Medications as of 08/06/2019  Medication Sig   acetaminophen (TYLENOL) 500 MG tablet Take 500 mg by mouth every 6 (six) hours as needed (pain).   benazepril (LOTENSIN) 40 MG tablet Take 1 tablet (40 mg total) by mouth daily.   cephALEXin (KEFLEX) 500 MG capsule Take 500 mg by mouth 2 (two) times daily.   cetirizine (ZYRTEC) 10 MG tablet Take 1 tablet (10 mg total) by mouth daily.   ELIQUIS 5 MG TABS tablet Take 1 tablet (5 mg total) by mouth 2 (two) times daily.   finasteride (PROSCAR) 5 MG tablet Take 1 tablet (5 mg total) by mouth daily.   fluticasone (FLONASE) 50 MCG/ACT nasal spray Place 2 sprays into both nostrils 2 (two) times daily.   metFORMIN (GLUCOPHAGE-XR) 500 MG 24 hr tablet Take 2 tablets (1,000 mg total) by mouth 2 (two) times daily.   metoprolol succinate (TOPROL-XL) 25 MG 24 hr tablet Take 1 tablet by mouth daily.   ondansetron (ZOFRAN ODT) 4 MG disintegrating tablet Take 1 tablet (4 mg total) by mouth every 8 (eight) hours as needed for nausea  or vomiting. (Patient not taking: Reported on 07/30/2019)   pantoprazole (PROTONIX) 40 MG tablet Take 40 mg by mouth at bedtime.    propafenone (RYTHMOL SR) 425 MG 12 hr capsule Take 425 mg by mouth 2 (two) times daily.   tamsulosin (FLOMAX) 0.4 MG CAPS capsule Take 0.8 mg by mouth daily.   No facility-administered encounter medications on file as of 08/06/2019.     Objective:  BP Readings from Last 3 Encounters:  07/30/19 128/68  06/25/19 138/86  04/21/19 121/76    Goals Addressed              This Visit's Progress     RNCM: Pt-"I do the best I can" (pt-stated)        CARE PLAN ENTRY (see longtitudinal plan of care for additional care plan information)  Current Barriers:   Chronic Disease Management support, education, and care coordination needs related to Atrial Fibrillation, HTN, HLD, DMII, and Chronic Back Pain  Limited mobility related to chronic back pain and discomfort  Clinical Goal(s) related to Atrial Fibrillation, HTN, HLD, DMII, and Chronic  Back Pain :  Over the next 120 days, patient will:   Work with the care management team to address educational, disease management, and care coordination needs   Begin or continue self health monitoring activities as directed today Measure and record cbg (blood glucose) 1 times daily, Measure and record blood pressure 3 times per week, and follow a Heart Healthy/ADA diet  Call provider office for  new or worsened signs and symptoms Blood glucose findings outside established parameters, Blood pressure findings outside established parameters, Shortness of breath, and New or worsened symptom related to AFIB and Chronic back pain  Call care management team with questions or concerns  Verbalize basic understanding of patient centered plan of care established today  Interventions related to Atrial Fibrillation, HTN, HLD, DMII, and Chronic Back Pain :   Evaluation of current treatment plans and patient's adherence to plan as  established by provider.  The patient is going on May 25th to have a "spine pain stimulator" placed.  He will go back on June the 1st and if this is working for him it will be permanently implanted. The patient is hopeful this will give him much needed relief related to his chronic back pain. His pain level is tolerable when he is sitting down but when he is walking it is almost "unbearable".  Education and support. 08-06-2019: the patient had surgery and has the stimulator placed. Has had issues with urinary retention and is presently wearing a foley catheter. Has had 2 trials without the foley and can not urinate on his own. The patient is frustrated about this but he will continue to work with the providers and hopefully he will be able to urinate without the catheter before long.   Assessed patient understanding of disease states- the patient is very knowledgeable about his health conditions but chronic back pain limits him in managing his care effectively. Encouraged the patient to monitor his blood pressure and blood sugars more frequently.  The patient had a recent hemoglobin A1C of 6.5.  He will continue to take his readings and record findings. He will bring at his next scheduled appointment. Endorses compliance with regimen to manage his chronic conditions.   Assessed patient's education and care coordination needs- education and support on monitoring his dietary intake- watching his sodium and sugar. Will continue to encourage healthy food choices.  Patient verbalized he has gained weight and his weight is 197 now.   Assessed safety when ambulating. The patient is using a "crutch" when he walks. The patient verbalized he would not be able to walk if he did not use his crutch. Encouraged the patient to pace his activities and know his limitations. The patient does not like to take pain medications and "deals with the pain".  Encouraged the patient to listen to his body and take breaks as needed to  help with pain management. 08-06-2019: the patient is still having a lot of pain when ambulating. The patient is only at level 2 on his stimulator and it has 35 levels. He is working with the manufacture to change settings and hopeful this will help him with his pain level. The patient is having a hard time right now post surgery but is remaining positive. Empathetic listening and support given.   Provided disease specific education to patient- Information on DASH and ADA diets provided by the MyChart function- completed.  Will search for information related to spine pain stimulator to send by MyChart for the patient.   Review of Vitamin B12 supplement as recommended by pcp. The patient has started taking the supplement of Vitamin B12 as recommended by the pcp. 08-06-2019: The patient was concerned about low levels of hemoglobin and hematocrit.  Education given. Read the notes to the patient from the pcp and the patient verbalized understanding. He will call his surgeon if needed for further investigation of levels. Reminded the patient of his appointment  with Jolene on 08-28-2019 with repeat lab work. The patient states he has so much going on. He will just be glad when he feels better.   Collaborated with appropriate clinical care team members regarding patient needs  Patient Self Care Activities related to Atrial Fibrillation, HTN, HLD, DMII, and Chronic Back Pain :   Patient is unable to independently self-manage chronic health conditions  Please see past updates related to this goal by clicking on the "Past Updates" button in the selected goal          Plan:   The care management team will reach out to the patient again over the next 30 to 60 days.    Noreene Larsson RN, MSN, [facility] Mobile: [phone]

## 2019-08-06 NOTE — Patient Instructions (Signed)
Visit Information  Goals Addressed              This Visit's Progress   .  RNCM: Pt-"I do the best I can" (pt-stated)        CARE PLAN ENTRY (see longtitudinal plan of care for additional care plan information)  Current Barriers:  . Chronic Disease Management support, education, and care coordination needs related to Atrial Fibrillation, HTN, HLD, DMII, and Chronic Back Pain . Limited mobility related to chronic back pain and discomfort  Clinical Goal(s) related to Atrial Fibrillation, HTN, HLD, DMII, and Chronic  Back Pain :  Over the next 120 days, patient will:  . Work with the care management team to address educational, disease management, and care coordination needs  . Begin or continue self health monitoring activities as directed today Measure and record cbg (blood glucose) 1 times daily, Measure and record blood pressure 3 times per week, and follow a Heart Healthy/ADA diet . Call provider office for new or worsened signs and symptoms Blood glucose findings outside established parameters, Blood pressure findings outside established parameters, Shortness of breath, and New or worsened symptom related to AFIB and Chronic back pain . Call care management team with questions or concerns . Verbalize basic understanding of patient centered plan of care established today  Interventions related to Atrial Fibrillation, HTN, HLD, DMII, and Chronic Back Pain :  . Evaluation of current treatment plans and patient's adherence to plan as established by provider.  The patient is going on May 25th to have a "spine pain stimulator" placed.  He will go back on June the 1st and if this is working for him it will be permanently implanted. The patient is hopeful this will give him much needed relief related to his chronic back pain. His pain level is tolerable when he is sitting down but when he is walking it is almost "unbearable".  Education and support. 08-06-2019: the patient had surgery and has the  stimulator placed. Has had issues with urinary retention and is presently wearing a foley catheter. Has had 2 trials without the foley and can not urinate on his own. The patient is frustrated about this but he will continue to work with the providers and hopefully he will be able to urinate without the catheter before long.  . Assessed patient understanding of disease states- the patient is very knowledgeable about his health conditions but chronic back pain limits him in managing his care effectively. Encouraged the patient to monitor his blood pressure and blood sugars more frequently.  The patient had a recent hemoglobin A1C of 6.5.  He will continue to take his readings and record findings. He will bring at his next scheduled appointment. Endorses compliance with regimen to manage his chronic conditions.  . Assessed patient's education and care coordination needs- education and support on monitoring his dietary intake- watching his sodium and sugar. Will continue to encourage healthy food choices.  Patient verbalized he has gained weight and his weight is 197 now.  . Assessed safety when ambulating. The patient is using a "crutch" when he walks. The patient verbalized he would not be able to walk if he did not use his crutch. Encouraged the patient to pace his activities and know his limitations. The patient does not like to take pain medications and "deals with the pain".  Encouraged the patient to listen to his body and take breaks as needed to help with pain management. 08-06-2019: the patient is still having a  lot of pain when ambulating. The patient is only at level 2 on his stimulator and it has 35 levels. He is working with the manufacture to change settings and hopeful this will help him with his pain level. The patient is having a hard time right now post surgery but is remaining positive. Empathetic listening and support given.  . Provided disease specific education to patient- Information on DASH  and ADA diets provided by the MyChart function- completed.  Will search for information related to spine pain stimulator to send by MyChart for the patient.  . Review of Vitamin B12 supplement as recommended by pcp. The patient has started taking the supplement of Vitamin B12 as recommended by the pcp. 08-06-2019: The patient was concerned about low levels of hemoglobin and hematocrit.  Education given. Read the notes to the patient from the pcp and the patient verbalized understanding. He will call his surgeon if needed for further investigation of levels. Reminded the patient of his appointment with Jolene on 08-28-2019 with repeat lab work. The patient states he has so much going on. He will just be glad when he feels better.  Nash Dimmer with appropriate clinical care team members regarding patient needs  Patient Self Care Activities related to Atrial Fibrillation, HTN, HLD, DMII, and Chronic Back Pain :  . Patient is unable to independently self-manage chronic health conditions  Please see past updates related to this goal by clicking on the "Past Updates" button in the selected goal         Patient verbalizes understanding of instructions provided today.   The care management team will reach out to the patient again over the next 30 to 60 days.   Noreene Larsson RN, MSN, [facility] Mobile: [phone]

## 2019-08-07 ENCOUNTER — Telehealth: Payer: Self-pay | Admitting: Nurse Practitioner

## 2019-08-07 NOTE — Telephone Encounter (Signed)
Copied from [place] [phone]. Topic: Medicare AWV >> Aug 07, 2019  2:11 PM Cher Nakai R wrote: Reason for CRM:  Left message for patient to call back and schedule the Medicare Annual Wellness Visit (AWV) virtually.  Last AWV 08/12/2018  Please schedule at anytime with [facility]-Nurse Health Advisor.  45 minute appointment

## 2019-08-20 ENCOUNTER — Ambulatory Visit (INDEPENDENT_AMBULATORY_CARE_PROVIDER_SITE_OTHER): Payer: Medicare Other | Admitting: Nurse Practitioner

## 2019-08-20 ENCOUNTER — Encounter: Payer: Self-pay | Admitting: Nurse Practitioner

## 2019-08-20 ENCOUNTER — Other Ambulatory Visit: Payer: Self-pay

## 2019-08-20 VITALS — BP 133/71 | HR 63 | Temp 98.1°F | Wt 187.8 lb

## 2019-08-20 DIAGNOSIS — I480 Paroxysmal atrial fibrillation: Secondary | ICD-10-CM

## 2019-08-20 DIAGNOSIS — D6869 Other thrombophilia: Secondary | ICD-10-CM

## 2019-08-20 DIAGNOSIS — I10 Essential (primary) hypertension: Secondary | ICD-10-CM

## 2019-08-20 DIAGNOSIS — E1121 Type 2 diabetes mellitus with diabetic nephropathy: Secondary | ICD-10-CM | POA: Diagnosis not present

## 2019-08-20 DIAGNOSIS — I152 Hypertension secondary to endocrine disorders: Secondary | ICD-10-CM

## 2019-08-20 DIAGNOSIS — R7989 Other specified abnormal findings of blood chemistry: Secondary | ICD-10-CM | POA: Diagnosis not present

## 2019-08-20 DIAGNOSIS — R5383 Other fatigue: Secondary | ICD-10-CM | POA: Diagnosis not present

## 2019-08-20 DIAGNOSIS — E1169 Type 2 diabetes mellitus with other specified complication: Secondary | ICD-10-CM

## 2019-08-20 DIAGNOSIS — E785 Hyperlipidemia, unspecified: Secondary | ICD-10-CM

## 2019-08-20 DIAGNOSIS — R339 Retention of urine, unspecified: Secondary | ICD-10-CM | POA: Diagnosis not present

## 2019-08-20 DIAGNOSIS — E1122 Type 2 diabetes mellitus with diabetic chronic kidney disease: Secondary | ICD-10-CM

## 2019-08-20 DIAGNOSIS — E1159 Type 2 diabetes mellitus with other circulatory complications: Secondary | ICD-10-CM | POA: Diagnosis not present

## 2019-08-20 DIAGNOSIS — N3001 Acute cystitis with hematuria: Secondary | ICD-10-CM

## 2019-08-20 DIAGNOSIS — R410 Disorientation, unspecified: Secondary | ICD-10-CM | POA: Diagnosis not present

## 2019-08-20 DIAGNOSIS — D519 Vitamin B12 deficiency anemia, unspecified: Secondary | ICD-10-CM | POA: Diagnosis not present

## 2019-08-20 DIAGNOSIS — N1831 Chronic kidney disease, stage 3a: Secondary | ICD-10-CM | POA: Diagnosis not present

## 2019-08-20 LAB — URINALYSIS, ROUTINE W REFLEX MICROSCOPIC
Bilirubin, UA: NEGATIVE
Glucose, UA: NEGATIVE
Nitrite, UA: POSITIVE — AB
Specific Gravity, UA: >1.03 — ABNORMAL HIGH (ref 1.005–1.030)
Urobilinogen, Ur: 0.2 mg/dL (ref 0.2–1.0)
pH, UA: 5.5 (ref 5.0–7.5)

## 2019-08-20 LAB — BAYER DCA HB A1C WAIVED: HB A1C (BAYER DCA - WAIVED): 6.3 % (ref <7.0)

## 2019-08-20 LAB — CBC WITH DIFFERENTIAL/PLATELET
Hematocrit: 34.2 % — ABNORMAL LOW (ref 37.5–51.0)
Hemoglobin: 11.5 g/dL — ABNORMAL LOW (ref 13.0–17.7)
Lymphocytes Absolute: 1.6 10*3/uL (ref 0.7–3.1)
Lymphs: 28 %
MCH: 30.4 pg (ref 26.6–33.0)
MCHC: 33.6 g/dL (ref 31.5–35.7)
MCV: 91 fL (ref 79–97)
MID (Absolute): 0.5 10*3/uL (ref 0.1–1.6)
MID: 8 %
Neutrophils Absolute: 3.5 10*3/uL (ref 1.4–7.0)
Neutrophils: 64 %
Platelets: 196 10*3/uL (ref 150–450)
RBC: 3.78 x10E6/uL — ABNORMAL LOW (ref 4.14–5.80)
RDW: 15.2 % (ref 11.6–15.4)
WBC: 5.6 10*3/uL (ref 3.4–10.8)

## 2019-08-20 LAB — MICROSCOPIC EXAMINATION: WBC, UA: >30 /hpf — AB (ref 0–5)

## 2019-08-20 MED ORDER — CEPHALEXIN 500 MG PO CAPS
500.0000 mg | ORAL_CAPSULE | Freq: Four times a day (QID) | ORAL | 0 refills | Status: AC
Start: 2019-08-20 — End: 2019-08-27

## 2019-08-20 NOTE — Assessment & Plan Note (Signed)
Chronic, ongoing.  Continue current medication regimen and collaboration with cardiology.  Monitor CBC closely due to anemia.

## 2019-08-20 NOTE — Progress Notes (Signed)
BP 133/71   Pulse 63   Temp 98.1 F (36.7 C) (Oral)   Wt 187 lb 12.8 oz (85.2 kg)   SpO2 98%   BMI 24.78 kg/m    Subjective:    Patient ID: Sean Hacker., male    DOB: 1940/05/17, 79 y.o.   MRN: [id number]  HPI: Sean Beaumier. is a 79 y.o. male  Chief Complaint  Patient presents with  . Fatigue    4 week f/up- states he is still fatigued and still has been having issues since stimulator surgery 07/11/19. States he still has a catheter as well   . Urinary Tract Infection    pt states his daughter wants him checked for a UTI due to having a catheter. States he did have 2 recent episodes of confusion over the weekend as well    FATIGUE Presents for follow-up recent visit for fatigue on 07/30/19.  Had thoracic spinal cord stimulator placed initially on 07/01/2019 and then permanent placement on 07/07/2019 -- reports the anesthesia "messed" him up.  Has had to go to ER x 2 due to urinary retention and UTI -- last 07/27/2019 and started on Macrobid.  Is going to urology -- has foley catheter in place for retention -- last visit with them was 08/01/19.  He reports some confusion episodes x 2 recently and concern for UTI -- no other symptoms with this.  Recent labs did note mild elevation in TSH at 5.110, but free T4 normal.  CBC H/H 10.9/32.7, MCV 91, B12 692, iron 72, ferritin 72.   Duration:  weeks Severity: 3/10 -- improving Onset: after surgery Context when symptoms started:  post surgery Symptoms improve with rest: yes  Depressive symptoms: no Stress/anxiety: no Insomnia: none Snoring: no Observed apnea by bed partner: no Daytime hypersomnolence:no Wakes feeling refreshed: yes History of sleep study: no Dysnea on exertion:  no Orthopnea/PND: no Chest pain: no Chronic cough: no Lower extremity edema: no Arthralgias:no Myalgias: no Weakness: no Rash: no  DIABETES Recent A1C in March was 6.5%. Continues on Metformin daily. Hypoglycemic  episodes:no Polydipsia/polyuria:no Visual disturbance:no Chest pain:no Paresthesias:no Glucose Monitoring:yes Accucheck frequency: a couple times week Fasting glucose: 120 range Post prandial: Evening: Before meals: Taking Insulin?:no Long acting insulin: Short acting insulin: Blood Pressure Monitoring:a few times a day Retinal Examination:Up to Date Foot Exam:Up to Date Pneumovax:Up to Date Influenza:Up to Date Aspirin:no  HYPERTENSION / HYPERLIPIDEMIA Followed by Dr. Nehemiah Massed for cardiology. Last seen 01/29/2019 with no changes made. Does have some anemia noted on labs. Started on Eliquis over a year ago for atrial fibrillation. He reported last visit he took himself off Amlodipine and one Metoprolol daily due to some dizziness since surgery, reports he feels a little better with this.  Satisfied with current treatment?yes Duration of hypertension:chronic BP monitoring frequency:a few times a week BP range:130/80's BP medication side effects:no Duration of hyperlipidemia:chronic Cholesterol medication side effects:no Cholesterol supplements: none Medication compliance:good compliance Aspirin:no Recent stressors:no Recurrent headaches:no Visual changes:no Palpitations:no Dyspnea:no Chest pain:no Lower extremity edema:no Dizzy/lightheaded:no The 10-year ASCVD risk score Mikey Bussing DC Jr., et al., 2013) is: 53.9%   Values used to calculate the score:     Age: 87 years     Sex: Male     Is Non-Hispanic African American: No     Diabetic: Yes     Tobacco smoker: No     Systolic Blood Pressure: 850 mmHg     Is BP treated: Yes     HDL Cholesterol:  40 mg/dL     Total Cholesterol: 146 mg/dL   CHRONIC KIDNEY DISEASE December GFR 58 and CRT 1.21. CKD status:stable Medications renally dose:yes Previous renal evaluation:no Pneumovax:Up to  Date Influenza Vaccine:Up to Date Estimated Creatinine Clearance: 57.8 mL/min (by C-G formula based on SCr of 1.21 mg/dL).  Relevant past medical, surgical, family and social history reviewed and updated as indicated. Interim medical history since our last visit reviewed. Allergies and medications reviewed and updated.  Review of Systems  Constitutional: Positive for fatigue. Negative for activity change, diaphoresis and fever.  Respiratory: Negative for cough, chest tightness, shortness of breath and wheezing.   Cardiovascular: Negative for chest pain, palpitations and leg swelling.  Gastrointestinal: Negative.   Endocrine: Negative for cold intolerance, heat intolerance, polydipsia, polyphagia and polyuria.  Neurological: Negative.   Psychiatric/Behavioral: Negative.     Per HPI unless specifically indicated above     Objective:    BP 133/71   Pulse 63   Temp 98.1 F (36.7 C) (Oral)   Wt 187 lb 12.8 oz (85.2 kg)   SpO2 98%   BMI 24.78 kg/m   Wt Readings from Last 3 Encounters:  08/20/19 187 lb 12.8 oz (85.2 kg)  07/30/19 188 lb (85.3 kg)  06/25/19 196 lb (88.9 kg)    Physical Exam Vitals and nursing note reviewed.  Constitutional:      General: He is awake. He is not in acute distress.    Appearance: He is well-developed and well-groomed. He is not ill-appearing.  HENT:     Head: Normocephalic and atraumatic.     Right Ear: Hearing normal. No drainage.     Left Ear: Hearing normal. No drainage.  Eyes:     General: Lids are normal.        Right eye: No discharge.        Left eye: No discharge.     Conjunctiva/sclera: Conjunctivae normal.     Pupils: Pupils are equal, round, and reactive to light.  Neck:     Thyroid: No thyromegaly.     Vascular: No carotid bruit.     Trachea: Trachea normal.  Cardiovascular:     Rate and Rhythm: Normal rate and regular rhythm.     Heart sounds: Normal heart sounds, S1 normal and S2 normal. No murmur heard.  No gallop.    Pulmonary:     Effort: Pulmonary effort is normal. No accessory muscle usage or respiratory distress.     Breath sounds: Normal breath sounds.  Abdominal:     General: Bowel sounds are normal. There is no distension.     Palpations: Abdomen is soft. There is no hepatomegaly.     Tenderness: There is no abdominal tenderness. There is no right CVA tenderness or left CVA tenderness.  Musculoskeletal:        General: Normal range of motion.     Cervical back: Normal range of motion and neck supple.     Right lower leg: No edema.     Left lower leg: No edema.  Lymphadenopathy:     Cervical: No cervical adenopathy.  Skin:    General: Skin is warm and dry.     Capillary Refill: Capillary refill takes less than 2 seconds.  Neurological:     Mental Status: He is alert and oriented to person, place, and time.     Cranial Nerves: Cranial nerves are intact.     Motor: Motor function is intact.     Coordination: Coordination is intact.  Deep Tendon Reflexes: Reflexes are normal and symmetric.  Psychiatric:        Attention and Perception: Attention normal.        Mood and Affect: Mood normal.        Speech: Speech normal.        Behavior: Behavior normal. Behavior is cooperative.        Thought Content: Thought content normal.    Results for orders placed or performed in visit on 07/30/19  CBC with Differential/Platelet  Result Value Ref Range   WBC 6.1 3.4 - 10.8 x10E3/uL   RBC 3.58 (L) 4.14 - 5.80 x10E6/uL   Hemoglobin 10.9 (L) 13.0 - 17.7 g/dL   Hematocrit 32.7 (L) 37.5 - 51.0 %   MCV 91 79 - 97 fL   MCH 30.4 26.6 - 33.0 pg   MCHC 33.3 31 - 35 g/dL   RDW 14.3 11.6 - 15.4 %   Platelets 179 150 - 450 x10E3/uL   Neutrophils 52 Not Estab. %   Lymphs 35 Not Estab. %   Monocytes 12 Not Estab. %   Eos 1 Not Estab. %   Basos 0 Not Estab. %   Neutrophils Absolute 3.2 1 - 7 x10E3/uL   Lymphocytes Absolute 2.1 0 - 3 x10E3/uL   Monocytes Absolute 0.7 0 - 0 x10E3/uL   EOS (ABSOLUTE)  0.1 0.0 - 0.4 x10E3/uL   Basophils Absolute 0.0 0 - 0 x10E3/uL   Immature Granulocytes 0 Not Estab. %   Immature Grans (Abs) 0.0 0.0 - 0.1 x10E3/uL  Comprehensive metabolic panel  Result Value Ref Range   Glucose 136 (H) 65 - 99 mg/dL   BUN 21 8 - 27 mg/dL   Creatinine, Ser 1.21 0.76 - 1.27 mg/dL   GFR calc non Af Amer 58 (L) >59 mL/min/1.73   GFR calc Af Amer 67 >59 mL/min/1.73   BUN/Creatinine Ratio 17 10 - 24   Sodium 139 134 - 144 mmol/L   Potassium 4.5 3.5 - 5.2 mmol/L   Chloride 101 96 - 106 mmol/L   CO2 26 20 - 29 mmol/L   Calcium 9.7 8.6 - 10.2 mg/dL   Total Protein 6.9 6.0 - 8.5 g/dL   Albumin 4.1 3.7 - 4.7 g/dL   Globulin, Total 2.8 1.5 - 4.5 g/dL   Albumin/Globulin Ratio 1.5 1.2 - 2.2   Bilirubin Total 0.3 0.0 - 1.2 mg/dL   Alkaline Phosphatase 71 48 - 121 IU/L   AST 13 0 - 40 IU/L   ALT 10 0 - 44 IU/L  TSH  Result Value Ref Range   TSH 5.110 (H) 0.450 - 4.500 uIU/mL  Vitamin B12  Result Value Ref Range   Vitamin B-12 692 232 - 1,245 pg/mL  Iron, TIBC and Ferritin Panel  Result Value Ref Range   Total Iron Binding Capacity 350 250 - 450 ug/dL   UIBC 278 111 - 343 ug/dL   Iron 72 38 - 169 ug/dL   Iron Saturation 21 15 - 55 %   Ferritin 72 30.0 - 400.0 ng/mL  T4, free  Result Value Ref Range   Free T4 1.30 0.82 - 1.77 ng/dL  Specimen status report  Result Value Ref Range   specimen status report Comment       Assessment & Plan:   Problem List Items Addressed This Visit      Cardiovascular and Mediastinum   Hypertension associated with diabetes ([place])    Chronic, stable with BP at goal for age in  office, even with his self medication changes at home post surgery.  A1C 6.3% today.  Continue current medication regimen and collaboration with cardiology.  Recommend he continue to check BP at home at least 3 mornings a week and document.  BMP today.  Return in 6 weeks.      Paroxysmal atrial fibrillation (HCC)    Chronic, ongoing.  Continue current medication  regimen and collaboration with cardiology.  Monitor CBC closely due to anemia.        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  Continue Benazepril for kidney protection and may consider reduction of Metformin in future dependent on kidney function on labs.  Educated him on kidney disease and monitoring.  Consider referral to nephrology in future if ever any worsening of function noted.  Return in 3 months for T2DM check.      Relevant Orders   Bayer DCA Hb A1c Waived   Basic metabolic panel   Hyperlipidemia associated with type 2 diabetes mellitus (HCC)    Chronic, ongoing.  No current statin, would benefit from this, continue to recommend.  Will check lipid panel next visit.        Genitourinary   CKD (chronic kidney disease) stage 3, GFR 30-59 ml/min    Chronic, ongoing.  Continue to monitor closely and refer to nephrology if decline.  Continue Benazepril for kidney protection.  Renal dose medications as needed based on labs.      Urinary retention    Post surgical, currently followed by urology and has catheter in place.  Continue this collaboration and he plans to reach out to them today about current UTI.      Acute cystitis with hematuria    With current foley catheter in place by urology due to urinary retention post surgery.  UA noting + NIT, 3+ BLD, Many bacteria, LEUKS.  Script for Keflex sent in, as recently treated with Macrobid.  He is to reach out to urology for follow-up ASAP.  ?need for prophylactic abx therapy while catheter in place.  Concern for recurrent UTI.  Recommend increase hydration at home.  Return in 6 weeks or sooner if return of symptoms.      Relevant Orders   Urinalysis, Routine w reflex microscopic     Hematopoietic and Hemostatic   Acquired thrombophilia ([place])    Patient on Eliquis, monitor CBC regularly.        Other   Anemia    CBC today, patient on  Eliquis.  Some improvement on recent labs -- is post surgical and has catheter in place -- risk for bleeding.      Relevant Orders   CBC With Differential/Platelet   Fatigue    S/P thoracic spinal cord stimulator placed initially on 07/01/2019 and then permanent placement on 07/07/2019.  Recheck labs today. Fatigue is improving some.  Recommend increased rest at home and hydration.  Return to office in 6 weeks for follow-up, sooner if any worsening of symptoms.      Relevant Orders   TSH   T4, free   Elevated TSH    Noted on recent labs -- recheck today, has had some fatigue.  TSH and Free T4 today.      Relevant Orders   TSH   T4, free       Follow up plan: Return in about 6 weeks (around 10/01/2019) for Fatigue and urine  check.

## 2019-08-20 NOTE — Patient Instructions (Signed)
Urinary Tract Infection, Adult A urinary tract infection (UTI) is an infection of any part of the urinary tract. The urinary tract includes:  The kidneys.  The ureters.  The bladder.  The urethra. These organs make, store, and get rid of pee (urine) in the body. What are the causes? This is caused by germs (bacteria) in your genital area. These germs grow and cause swelling (inflammation) of your urinary tract. What increases the risk? You are more likely to develop this condition if:  You have a small, thin tube (catheter) to drain pee.  You cannot control when you pee or poop (incontinence).  You are male, and: ? You use these methods to prevent pregnancy:  A medicine that kills sperm (spermicide).  A device that blocks sperm (diaphragm). ? You have low levels of a male hormone (estrogen). ? You are pregnant.  You have genes that add to your risk.  You are sexually active.  You take antibiotic medicines.  You have trouble peeing because of: ? A prostate that is bigger than normal, if you are male. ? A blockage in the part of your body that drains pee from the bladder (urethra). ? A kidney stone. ? A nerve condition that affects your bladder (neurogenic bladder). ? Not getting enough to drink. ? Not peeing often enough.  You have other conditions, such as: ? Diabetes. ? A weak disease-fighting system (immune system). ? Sickle cell disease. ? Gout. ? Injury of the spine. What are the signs or symptoms? Symptoms of this condition include:  Needing to pee right away (urgently).  Peeing often.  Peeing small amounts often.  Pain or burning when peeing.  Blood in the pee.  Pee that smells bad or not like normal.  Trouble peeing.  Pee that is cloudy.  Fluid coming from the vagina, if you are male.  Pain in the belly or lower back. Other symptoms include:  Throwing up (vomiting).  No urge to eat.  Feeling mixed up (confused).  Being tired  and grouchy (irritable).  A fever.  Watery poop (diarrhea). How is this treated? This condition may be treated with:  Antibiotic medicine.  Other medicines.  Drinking enough water. Follow these instructions at home:  Medicines  Take over-the-counter and prescription medicines only as told by your doctor.  If you were prescribed an antibiotic medicine, take it as told by your doctor. Do not stop taking it even if you start to feel better. General instructions  Make sure you: ? Pee until your bladder is empty. ? Do not hold pee for a long time. ? Empty your bladder after sex. ? Wipe from front to back after pooping if you are a male. Use each tissue one time when you wipe.  Drink enough fluid to keep your pee pale yellow.  Keep all follow-up visits as told by your doctor. This is important. Contact a doctor if:  You do not get better after 1-2 days.  Your symptoms go away and then come back. Get help right away if:  You have very bad back pain.  You have very bad pain in your lower belly.  You have a fever.  You are sick to your stomach (nauseous).  You are throwing up. Summary  A urinary tract infection (UTI) is an infection of any part of the urinary tract.  This condition is caused by germs in your genital area.  There are many risk factors for a UTI. These include having a small, thin   tube to drain pee and not being able to control when you pee or poop.  Treatment includes antibiotic medicines for germs.  Drink enough fluid to keep your pee pale yellow. This information is not intended to replace advice given to you by your health care provider. Make sure you discuss any questions you have with your health care provider. Document Revised: 01/17/2018 Document Reviewed: 08/09/2017 Elsevier Patient Education  2020 [employer] Inc.  

## 2019-08-20 NOTE — Assessment & Plan Note (Signed)
Chronic, stable with BP at goal for age in office, even with his self medication changes at home post surgery.  A1C 6.3% today.  Continue current medication regimen and collaboration with cardiology.  Recommend he continue to check BP at home at least 3 mornings a week and document.  BMP today.  Return in 6 weeks.

## 2019-08-20 NOTE — Assessment & Plan Note (Signed)
Chronic, ongoing.  No current statin, would benefit from this, continue to recommend.  Will check lipid panel next visit.

## 2019-08-20 NOTE — Assessment & Plan Note (Signed)
Post surgical, currently followed by urology and has catheter in place.  Continue this collaboration and he plans to reach out to them today about current UTI.

## 2019-08-20 NOTE — Assessment & Plan Note (Addendum)
S/P thoracic spinal cord stimulator placed initially on 07/01/2019 and then permanent placement on 07/07/2019.  Recheck labs today. Fatigue is improving some.  Recommend increased rest at home and hydration.  Return to office in 6 weeks for follow-up, sooner if any worsening of symptoms.

## 2019-08-20 NOTE — Assessment & Plan Note (Signed)
Noted on recent labs -- recheck today, has had some fatigue.  TSH and Free T4 today.

## 2019-08-20 NOTE — Assessment & Plan Note (Signed)
Patient on Eliquis, monitor CBC regularly.

## 2019-08-20 NOTE — Assessment & Plan Note (Signed)
Chronic, ongoing.  Continue to monitor closely and refer to nephrology if decline.  Continue Benazepril for kidney protection.  Renal dose medications as needed based on labs. 

## 2019-08-20 NOTE — Assessment & Plan Note (Signed)
With current foley catheter in place by urology due to urinary retention post surgery.  UA noting + NIT, 3+ BLD, Many bacteria, LEUKS.  Script for Keflex sent in, as recently treated with Macrobid.  He is to reach out to urology for follow-up ASAP.  ?need for prophylactic abx therapy while catheter in place.  Concern for recurrent UTI.  Recommend increase hydration at home.  Return in 6 weeks or sooner if return of symptoms.

## 2019-08-20 NOTE — Assessment & Plan Note (Signed)
Chronic, ongoing with A1C 6.3% today.  Continue Benazepril for kidney protection and may consider reduction of Metformin in future dependent on kidney function on labs.  Educated him on kidney disease and monitoring.  Consider referral to nephrology in future if ever any worsening of function noted.  Return in 3 months for T2DM check.

## 2019-08-20 NOTE — Assessment & Plan Note (Signed)
CBC today, patient on Eliquis.  Some improvement on recent labs -- is post surgical and has catheter in place -- risk for bleeding.

## 2019-08-21 DIAGNOSIS — I480 Paroxysmal atrial fibrillation: Secondary | ICD-10-CM | POA: Diagnosis not present

## 2019-08-21 DIAGNOSIS — E782 Mixed hyperlipidemia: Secondary | ICD-10-CM | POA: Diagnosis not present

## 2019-08-21 DIAGNOSIS — I10 Essential (primary) hypertension: Secondary | ICD-10-CM | POA: Diagnosis not present

## 2019-08-21 DIAGNOSIS — N1831 Chronic kidney disease, stage 3a: Secondary | ICD-10-CM | POA: Diagnosis not present

## 2019-08-21 LAB — BASIC METABOLIC PANEL
BUN/Creatinine Ratio: 16 (ref 10–24)
BUN: 20 mg/dL (ref 8–27)
CO2: 23 mmol/L (ref 20–29)
Calcium: 9.7 mg/dL (ref 8.6–10.2)
Chloride: 99 mmol/L (ref 96–106)
Creatinine, Ser: 1.26 mg/dL (ref 0.76–1.27)
GFR calc Af Amer: 63 mL/min/{1.73_m2} (ref >59)
GFR calc non Af Amer: 55 mL/min/{1.73_m2} — ABNORMAL LOW (ref >59)
Glucose: 195 mg/dL — ABNORMAL HIGH (ref 65–99)
Potassium: 4.4 mmol/L (ref 3.5–5.2)
Sodium: 136 mmol/L (ref 134–144)

## 2019-08-21 LAB — TSH: TSH: 4.19 u[IU]/mL (ref 0.450–4.500)

## 2019-08-21 LAB — T4, FREE: Free T4: 1.32 ng/dL (ref 0.82–1.77)

## 2019-08-21 NOTE — Progress Notes (Signed)
Contacted via [facility] [place], your labs have returned.  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  We will continue to monitor.  Thyroid testing this check was normal.  No changes needed there.  Continue antibiotic at this time.  Let me know what urology says.  Have a great day!! Keep being awesome!! Kindest regards, Elbie Statzer

## 2019-08-23 ENCOUNTER — Other Ambulatory Visit: Payer: Self-pay | Admitting: Nurse Practitioner

## 2019-08-23 NOTE — Telephone Encounter (Signed)
Requested Prescriptions  Pending Prescriptions Disp Refills  . ELIQUIS 5 MG TABS tablet [Pharmacy Med Name: ELIQUIS 5 MG TABLET] 180 tablet 0    Sig: Take 1 tablet (5 mg total) by mouth 2 (two) times daily.     Hematology:  Anticoagulants Failed - 08/23/2019  8:56 AM      Failed - HGB in normal range and within 360 days    Hemoglobin  Date Value Ref Range Status  08/20/2019 11.5 (L) 13.0 - 17.7 g/dL Final         Failed - HCT in normal range and within 360 days    Hematocrit  Date Value Ref Range Status  08/20/2019 34.2 (L) 37.5 - 51.0 % Final         Passed - PLT in normal range and within 360 days    Platelets  Date Value Ref Range Status  08/20/2019 196 150 - 450 x10E3/uL Final         Passed - Cr in normal range and within 360 days    Creatinine  Date Value Ref Range Status  07/01/2012 1.27 0.60 - 1.30 mg/dL Final   Creatinine, Ser  Date Value Ref Range Status  08/20/2019 1.26 0.76 - 1.27 mg/dL Final         Passed - Valid encounter within last 12 months    Recent Outpatient Visits          3 days ago Type 2 diabetes mellitus with stage 3a chronic kidney disease, without long-term current use of insulin ([place])   [place], [place] T, NP   3 weeks ago Fatigue, unspecified type   [employer], Jolene T, NP   4 months ago Type 2 diabetes mellitus with stage 3a chronic kidney disease, without long-term current use of insulin ([place])   [place], Calvin T, NP   5 months ago Neck fullness   [place], [place], [place]   7 months ago Annual physical exam   [place], [name], NP      Future Appointments            In 1 month [name], [name], NP [facility], [place]   In 2 months [place], [name], NP [facility], [facility]

## 2019-08-25 DIAGNOSIS — K449 Diaphragmatic hernia without obstruction or gangrene: Secondary | ICD-10-CM | POA: Diagnosis not present

## 2019-08-25 DIAGNOSIS — R1310 Dysphagia, unspecified: Secondary | ICD-10-CM | POA: Diagnosis not present

## 2019-08-25 DIAGNOSIS — Z8619 Personal history of other infectious and parasitic diseases: Secondary | ICD-10-CM | POA: Diagnosis not present

## 2019-08-25 DIAGNOSIS — K219 Gastro-esophageal reflux disease without esophagitis: Secondary | ICD-10-CM | POA: Diagnosis not present

## 2019-08-25 DIAGNOSIS — R399 Unspecified symptoms and signs involving the genitourinary system: Secondary | ICD-10-CM | POA: Diagnosis not present

## 2019-08-25 DIAGNOSIS — K529 Noninfective gastroenteritis and colitis, unspecified: Secondary | ICD-10-CM | POA: Diagnosis not present

## 2019-08-28 ENCOUNTER — Ambulatory Visit: Payer: Medicare Other | Admitting: Nurse Practitioner

## 2019-09-01 DIAGNOSIS — R339 Retention of urine, unspecified: Secondary | ICD-10-CM | POA: Diagnosis not present

## 2019-09-01 DIAGNOSIS — N3281 Overactive bladder: Secondary | ICD-10-CM | POA: Diagnosis not present

## 2019-09-08 ENCOUNTER — Ambulatory Visit (INDEPENDENT_AMBULATORY_CARE_PROVIDER_SITE_OTHER): Payer: Medicare Other

## 2019-09-08 VITALS — Ht 73.0 in | Wt 188.0 lb

## 2019-09-08 DIAGNOSIS — Z0000 Encounter for general adult medical examination without abnormal findings: Secondary | ICD-10-CM | POA: Diagnosis not present

## 2019-09-08 NOTE — Patient Instructions (Signed)
[name] , Thank you for taking time to come for your Medicare Wellness Visit. I appreciate your ongoing commitment to your health goals. Please review the following plan we discussed and let me know if I can assist you in the future.   Screening recommendations/referrals: Colonoscopy: not required Recommended yearly ophthalmology/optometry visit for glaucoma screening and checkup Recommended yearly dental visit for hygiene and checkup  Vaccinations: Influenza vaccine: completed 11/22/2018, due 09/14/2019 Pneumococcal vaccine: completed 06/02/2015 Tdap vaccine: completed 06/02/2015, due 06/01/2025 Shingles vaccine: completed   Covid-19: 03/17/2019, 02/24/2019  Advanced directives: Please bring a copy of your POA (Power of Attorney) and/or Living Will to your next appointment.   Conditions/risks identified: none  Next appointment: Follow up in one year for your annual wellness visit.   Preventive Care 77 Years and Older, Male Preventive care refers to lifestyle choices and visits with your health care provider that can promote health and wellness. What does preventive care include?  A yearly physical exam. This is also called an annual well check.  Dental exams once or twice a year.  Routine eye exams. Ask your health care provider how often you should have your eyes checked.  Personal lifestyle choices, including:  Daily care of your teeth and gums.  Regular physical activity.  Eating a healthy diet.  Avoiding tobacco and drug use.  Limiting alcohol use.  Practicing safe sex.  Taking low doses of aspirin every day.  Taking vitamin and mineral supplements as recommended by your health care provider. What happens during an annual well check? The services and screenings done by your health care provider during your annual well check will depend on your age, overall health, lifestyle risk factors, and family history of disease. Counseling  Your health care provider may ask you  questions about your:  Alcohol use.  Tobacco use.  Drug use.  Emotional well-being.  Home and relationship well-being.  Sexual activity.  Eating habits.  History of falls.  Memory and ability to understand (cognition).  Work and work Statistician. Screening  You may have the following tests or measurements:  Height, weight, and BMI.  Blood pressure.  Lipid and cholesterol levels. These may be checked every 5 years, or more frequently if you are over 77 years old.  Skin check.  Lung cancer screening. You may have this screening every year starting at age 77 if you have a 30-pack-year history of smoking and currently smoke or have quit within the past 15 years.  Fecal occult blood test (FOBT) of the stool. You may have this test every year starting at age 77.  Flexible sigmoidoscopy or colonoscopy. You may have a sigmoidoscopy every 5 years or a colonoscopy every 10 years starting at age 50.  Prostate cancer screening. Recommendations will vary depending on your family history and other risks.  Hepatitis C blood test.  Hepatitis B blood test.  Sexually transmitted disease (STD) testing.  Diabetes screening. This is done by checking your blood sugar (glucose) after you have not eaten for a while (fasting). You may have this done every 1-3 years.  Abdominal aortic aneurysm (AAA) screening. You may need this if you are a current or former smoker.  Osteoporosis. You may be screened starting at age 30 if you are at high risk. Talk with your health care provider about your test results, treatment options, and if necessary, the need for more tests. Vaccines  Your health care provider may recommend certain vaccines, such as:  Influenza vaccine. This is recommended  every year.  Tetanus, diphtheria, and acellular pertussis (Tdap, Td) vaccine. You may need a Td booster every 10 years.  Zoster vaccine. You may need this after age 39.  Pneumococcal 13-valent conjugate  (PCV13) vaccine. One dose is recommended after age 16.  Pneumococcal polysaccharide (PPSV23) vaccine. One dose is recommended after age 70. Talk to your health care provider about which screenings and vaccines you need and how often you need them. This information is not intended to replace advice given to you by your health care provider. Make sure you discuss any questions you have with your health care provider. Document Released: 02/26/2015 Document Revised: 10/20/2015 Document Reviewed: 12/01/2014 Elsevier Interactive Patient Education  2017 [place] Prevention in the Home Falls can cause injuries. They can happen to people of all ages. There are many things you can do to make your home safe and to help prevent falls. What can I do on the outside of my home?  Regularly fix the edges of walkways and driveways and fix any cracks.  Remove anything that might make you trip as you walk through a door, such as a raised step or threshold.  Trim any bushes or trees on the path to your home.  Use bright outdoor lighting.  Clear any walking paths of anything that might make someone trip, such as rocks or tools.  Regularly check to see if handrails are loose or broken. Make sure that both sides of any steps have handrails.  Any raised decks and porches should have guardrails on the edges.  Have any leaves, snow, or ice cleared regularly.  Use sand or salt on walking paths during winter.  Clean up any spills in your garage right away. This includes oil or grease spills. What can I do in the bathroom?  Use night lights.  Install grab bars by the toilet and in the tub and shower. Do not use towel bars as grab bars.  Use non-skid mats or decals in the tub or shower.  If you need to sit down in the shower, use a plastic, non-slip stool.  Keep the floor dry. Clean up any water that spills on the floor as soon as it happens.  Remove soap buildup in the tub or shower  regularly.  Attach bath mats securely with double-sided non-slip rug tape.  Do not have throw rugs and other things on the floor that can make you trip. What can I do in the bedroom?  Use night lights.  Make sure that you have a light by your bed that is easy to reach.  Do not use any sheets or blankets that are too big for your bed. They should not hang down onto the floor.  Have a firm chair that has side arms. You can use this for support while you get dressed.  Do not have throw rugs and other things on the floor that can make you trip. What can I do in the kitchen?  Clean up any spills right away.  Avoid walking on wet floors.  Keep items that you use a lot in easy-to-reach places.  If you need to reach something above you, use a strong step stool that has a grab bar.  Keep electrical cords out of the way.  Do not use floor polish or wax that makes floors slippery. If you must use wax, use non-skid floor wax.  Do not have throw rugs and other things on the floor that can make you trip. What  can I do with my stairs?  Do not leave any items on the stairs.  Make sure that there are handrails on both sides of the stairs and use them. Fix handrails that are broken or loose. Make sure that handrails are as long as the stairways.  Check any carpeting to make sure that it is firmly attached to the stairs. Fix any carpet that is loose or worn.  Avoid having throw rugs at the top or bottom of the stairs. If you do have throw rugs, attach them to the floor with carpet tape.  Make sure that you have a light switch at the top of the stairs and the bottom of the stairs. If you do not have them, ask someone to add them for you. What else can I do to help prevent falls?  Wear shoes that:  Do not have high heels.  Have rubber bottoms.  Are comfortable and fit you well.  Are closed at the toe. Do not wear sandals.  If you use a stepladder:  Make sure that it is fully  opened. Do not climb a closed stepladder.  Make sure that both sides of the stepladder are locked into place.  Ask someone to hold it for you, if possible.  Clearly mark and make sure that you can see:  Any grab bars or handrails.  First and last steps.  Where the edge of each step is.  Use tools that help you move around (mobility aids) if they are needed. These include:  Canes.  Walkers.  Scooters.  Crutches.  Turn on the lights when you go into a dark area. Replace any light bulbs as soon as they burn out.  Set up your furniture so you have a clear path. Avoid moving your furniture around.  If any of your floors are uneven, fix them.  If there are any pets around you, be aware of where they are.  Review your medicines with your doctor. Some medicines can make you feel dizzy. This can increase your chance of falling. Ask your doctor what other things that you can do to help prevent falls. This information is not intended to replace advice given to you by your health care provider. Make sure you discuss any questions you have with your health care provider. Document Released: 11/26/2008 Document Revised: 07/08/2015 Document Reviewed: 03/06/2014 Elsevier Interactive Patient Education  2017 [employer].

## 2019-09-08 NOTE — Progress Notes (Signed)
I connected with Sean Reyes today by telephone and verified that I am speaking with the correct person using two identifiers. Location patient: home Location provider: work Persons participating in the virtual visit: Van, Seymore LPN.   I discussed the limitations, risks, security and privacy concerns of performing an evaluation and management service by telephone and the availability of in person appointments. I also discussed with the patient that there may be a patient responsible charge related to this service. The patient expressed understanding and verbally consented to this telephonic visit.    Interactive audio and video telecommunications were attempted between this provider and patient, however failed, due to patient having technical difficulties OR patient did not have access to video capability.  We continued and completed visit with audio only.    Vital signs may be patient reported or missing.   Subjective:   Sean Simkin. is a 79 y.o. male who presents for Medicare Annual/Subsequent preventive examination.  Review of Systems     Cardiac Risk Factors include: advanced age (>34men, >16 women);diabetes mellitus;male gender;sedentary lifestyle     Objective:    Today's Vitals   09/08/19 0941 09/08/19 0942  Weight: 188 lb (85.3 kg)   Height: 6\' 1"  (1.854 m)   PainSc:  7    Body mass index is 24.8 kg/m.  Advanced Directives 09/08/2019 06/25/2019 09/24/2018 08/29/2018 08/12/2018 07/22/2018 07/20/2018  Does Patient Have a Medical Advance Directive? Yes No Yes Yes Yes Yes -  Type of Paramedic of [place];Living will - (No Data) Living will;Healthcare Power of Attorney Living will;Healthcare Power of Attorney Living will Living will;Healthcare Power of Attorney  Does patient want to make changes to medical advance directive? - - No - Patient declined - - No - Patient declined -  Copy of [place] in Chart? No - copy  requested - - No - copy requested No - copy requested No - copy requested No - copy requested  Would patient like information on creating a medical advance directive? - - - - - - -    Current Medications (verified) Outpatient Encounter Medications as of 09/08/2019  Medication Sig  . acetaminophen (TYLENOL) 500 MG tablet Take 500 mg by mouth every 6 (six) hours as needed (pain).  . benazepril (LOTENSIN) 40 MG tablet Take 1 tablet (40 mg total) by mouth daily.  . cetirizine (ZYRTEC) 10 MG tablet Take 1 tablet (10 mg total) by mouth daily.  [name] ELIQUIS 5 MG TABS tablet Take 1 tablet (5 mg total) by mouth 2 (two) times daily.  . finasteride (PROSCAR) 5 MG tablet Take 1 tablet (5 mg total) by mouth daily.  . fluticasone (FLONASE) 50 MCG/ACT nasal spray Place 2 sprays into both nostrils 2 (two) times daily.  . metFORMIN (GLUCOPHAGE-XR) 500 MG 24 hr tablet Take 2 tablets (1,000 mg total) by mouth 2 (two) times daily.  . metoprolol succinate (TOPROL-XL) 25 MG 24 hr tablet Take 1 tablet by mouth in the morning and at bedtime.   . pantoprazole (PROTONIX) 40 MG tablet Take 40 mg by mouth at bedtime.   . propafenone (RYTHMOL SR) 425 MG 12 hr capsule Take 425 mg by mouth 2 (two) times daily.  . tamsulosin (FLOMAX) 0.4 MG CAPS capsule Take 0.8 mg by mouth daily.  . ondansetron (ZOFRAN ODT) 4 MG disintegrating tablet Take 1 tablet (4 mg total) by mouth every 8 (eight) hours as needed for nausea or vomiting. (Patient not taking: Reported on  07/30/2019)   No facility-administered encounter medications on file as of 09/08/2019.    Allergies (verified) Levaquin [levofloxacin in d5w], Shellfish allergy, Amiodarone, and Adhesive [tape]   History: Past Medical History:  Diagnosis Date  . Anemia   . Anxiety   . Arthritis   . Atrial fibrillation ([place])   . Cataracts, bilateral   . Complication of anesthesia    pt reports low BP's after surgery at [facility] and difficulty awakening  . Depression   .  Diabetes ([place])    dx 6-8 yrs ago  . Dysrhythmia    a-fib  . GERD (gastroesophageal reflux disease)    OCC TAKES ALKA SELTZER  . History of kidney stones    10-15 yrs ago  . 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 Hyperlipidemia   . Hypertension   . Nocturia   . S/P ablation of atrial fibrillation    Ablative therapy  . Sleep apnea    CPAP   . Tachycardia, unspecified    Past Surgical History:  Procedure Laterality Date  . ABLATION    . ANTERIOR LAT LUMBAR FUSION N/A 06/27/2017   Procedure: Anterior Lateral Lumbar Interbody  Fusion - Lumbar Two-Lumbar Three - Lumbar Three-Lumbar Four, Posterior Lumbar Interbody Fusion Lumbar Four- Five;  Surgeon: Kary Kos, MD;  Location: [place];  Service: Neurosurgery;  Laterality: N/A;  Anterior Lateral Lumbar Interbody  Fusion - Lumbar Two-Lumbar Three - Lumbar Three-Lumbar Four, Posterior Lumbar Interbody Fusion Lumbar Four- Five  . BACK SURGERY    . CARDIOVERSION N/A 08/29/2018   Procedure: CARDIOVERSION;  Surgeon: Corey Skains, MD;  Location: [facility];  Service: Cardiovascular;  Laterality: N/A;  . CARDIOVERSION N/A 09/24/2018   Procedure: CARDIOVERSION;  Surgeon: Corey Skains, MD;  Location: [facility];  Service: Cardiovascular;  Laterality: N/A;  . COLONOSCOPY WITH PROPOFOL N/A 10/05/2015   Procedure: COLONOSCOPY WITH PROPOFOL;  Surgeon: Lollie Sails, MD;  Location[facility] ENDOSCOPY;  Service: Endoscopy;  Laterality: N/A;  . ESOPHAGOGASTRODUODENOSCOPY (EGD) WITH PROPOFOL N/A 04/01/2018   Procedure: ESOPHAGOGASTRODUODENOSCOPY (EGD) WITH PROPOFOL;  Surgeon: Lollie Sails, MD;  Location: [facility] ENDOSCOPY;  Service: Endoscopy;  Laterality: N/A;  . HERNIA REPAIR    . JOINT REPLACEMENT Bilateral    hips  RT+  LEFT X2   . LUMBAR LAMINECTOMY/DECOMPRESSION MICRODISCECTOMY Left 09/13/2016   Procedure: Microdiscectomy - Lumbar two-three,  Lumbar three- - left;  Surgeon: Kary Kos, MD;  Location: [place];  Service: Neurosurgery;   Laterality: Left;  . SPINAL CORD STIMULATOR INSERTION  07/08/2019  . TONSILLECTOMY     Family History  Problem Relation Age of Onset  . Brain cancer Mother   . Kidney disease Neg Hx   . Prostate cancer Neg Hx   . Kidney cancer Neg Hx   . Bladder Cancer Neg Hx    Social History   Socioeconomic History  . Marital status: Married    Spouse name: Malachy Mood   . Number of children: 2  . Years of education: Not on file  . Highest education level: High school graduate  Occupational History  . Occupation: retired   Tobacco Use  . Smoking status: Never Smoker  . Smokeless tobacco: Never Used  Vaping Use  . Vaping Use: Never used  Substance and Sexual Activity  . Alcohol use: No  . Drug use: No  . Sexual activity: Not on file  Other Topics Concern  . Not on file  Social History Narrative  Married   Gets regular exercise   Social Determinants of Health   Financial Resource Strain: Low Risk   . Difficulty of Paying Living Expenses: Not hard at all  Food Insecurity: No Food Insecurity  . Worried About Charity fundraiser in the Last Year: Never true  . Ran Out of Food in the Last Year: Never true  Transportation Needs: No Transportation Needs  . Lack of Transportation (Medical): No  . Lack of Transportation (Non-Medical): No  Physical Activity: Inactive  . Days of Exercise per Week: 0 days  . Minutes of Exercise per Session: 0 min  Stress: No Stress Concern Present  . Feeling of Stress : Not at all  Social Connections: Moderately Integrated  . Frequency of Communication with Friends and Family: More than three times a week  . Frequency of Social Gatherings with Friends and Family: More than three times a week  . Attends Religious Services: More than 4 times per year  . Active Member of Clubs or Organizations: No  . Attends Archivist Meetings: Never  . Marital Status: Married    Tobacco Counseling Counseling given: Not Answered   Clinical  Intake:  Pre-visit preparation completed: Yes  Pain : 0-10 Pain Score: 7  Pain Type: Chronic pain Pain Location: Back Pain Orientation: Lower Pain Descriptors / Indicators: Shooting Pain Onset: More than a month ago Pain Frequency: Intermittent Pain Relieving Factors: rest  Pain Relieving Factors: rest  Nutritional Status: BMI of 19-24  Normal Diabetes: Yes  How often do you need to have someone help you when you read instructions, pamphlets, or other written materials from your doctor or pharmacy?: 1 - Never What is the last grade level you completed in school?: 12th grade  Diabetic? Yes Nutrition Risk Assessment:  Has the patient had any N/V/D within the last 2 months?  No  Does the patient have any non-healing wounds?  No  Has the patient had any unintentional weight loss or weight gain?  Yes due to surgeries  Diabetes:  Is the patient diabetic?  Yes  If diabetic, was a CBG obtained today?  No  Did the patient bring in their glucometer from home?  No  How often do you monitor your CBG's? Couple times aweek.   Financial Strains and Diabetes Management:  Are you having any financial strains with the device, your supplies or your medication? No .  Does the patient want to be seen by Chronic Care Management for management of their diabetes?  No  Would the patient like to be referred to a Nutritionist or for Diabetic Management?  No   Diabetic Exams:  Diabetic Eye Exam: Completed 05/14/2019 Diabetic Foot Exam: Completed 01/20/2019   Interpreter Needed?: No  Information entered by :: NAllen LPN   Activities of Daily Living In your present state of health, do you have any difficulty performing the following activities: 09/08/2019 01/20/2019  Hearing? Tempie Donning  Comment hearing aides -  Vision? Y Y  Comment due to cataracts -  Difficulty concentrating or making decisions? N N  Walking or climbing stairs? Y Y  Comment due to back -  Dressing or bathing? N Y  Doing  errands, shopping? N Y  Conservation officer, nature and eating ? N -  Using the Toilet? N -  In the past six months, have you accidently leaked urine? N -  Do you have problems with loss of bowel control? N -  Managing your Medications? N -  Managing your  Finances? N -  Housekeeping or managing your Housekeeping? N -  Some recent data might be hidden    Patient Care Team: [name], NP as PCP - General (Nurse Practitioner) [name], MD as PCP - Family Medicine (Family Medicine) Corey Skains, MD as Consulting Physician (Internal Medicine) Ralene Bathe, MD (Dermatology) Kary Kos, MD as Consulting Physician (Neurosurgery) Marry Guan, Laurice Record, MD (Orthopedic Surgery) De Hollingshead, [employer] as Pharmacist (Pharmacist) Marlaine Hind, MD as Consulting Physician (Physical Medicine and Rehabilitation) Christy Sartorius, MD as Referring Physician (Urology) Vanita Ingles, RN as Case Manager (General Practice)  Indicate any recent Medical Services you may have received from other than [facility] providers in the past year (date may be approximate).     Assessment:   This is a routine wellness examination for [place].  Hearing/Vision screen  Hearing Screening   125Hz  250Hz  500Hz  1000Hz  2000Hz  3000Hz  4000Hz  6000Hz  8000Hz   Right ear:           Left ear:           Vision Screening Comments: Regular eye exams, Dr.Cheek  Dietary issues and exercise activities discussed: Current Exercise Habits: The patient does not participate in regular exercise at present  Goals    .  "My medications are expensive" (pt-stated)      Current Barriers:  . Financial Barriers- patient reported Eliquis and Rhythmol SR being expensive medications . Reports hx atrial fibrillation, but no known recurrence of arrhythmia since last ablation o Managed on metoprolol XL for rate control, Rhythmol SR for rhythm, and Eliquis for stroke ppx  Pharmacist Clinical Goal(s):  [name] Over the next 10 days, patient will work  with PharmD to address needs related to medication access  Interventions: . Comprehensive medication review performed.  . Reviewed criteria for patient assistance programs for Eliquis ([place]) and Rhythmol ([place]). Unfortunately, patient's total household income puts him over the requirements for both programs.  [name] He verbalizes that he is able to afford the medications at this time . Encouraged to reach out with any future medication related questions or concerns.   Patient Self Care Activities:  . Calls pharmacy for medication refills . Calls provider office for new concerns or questions  Initial goal documentation]     .  Increase water intake      Recommend drinking at least 3-4 glasses of water a day.    .  Patient Stated      09/08/2019, to get back better and stop self catherizations    .  RNCM: Pt-"I do the best I can" (pt-stated)      CARE PLAN ENTRY (see longtitudinal plan of care for additional care plan information)  Current Barriers:  . Chronic Disease Management support, education, and care coordination needs related to Atrial Fibrillation, HTN, HLD, DMII, and Chronic Back Pain . Limited mobility related to chronic back pain and discomfort  Clinical Goal(s) related to Atrial Fibrillation, HTN, HLD, DMII, and Chronic  Back Pain :  Over the next 120 days, patient will:  . Work with the care management team to address educational, disease management, and care coordination needs  . Begin or continue self health monitoring activities as directed today Measure and record cbg (blood glucose) 1 times daily, Measure and record blood pressure 3 times per week, and follow a Heart Healthy/ADA diet . Call provider office for new or worsened signs and symptoms Blood glucose findings outside established parameters, Blood pressure findings outside established  parameters, Shortness of breath, and New or worsened symptom related to AFIB and Chronic back pain . Call care  management team with questions or concerns . Verbalize basic understanding of patient centered plan of care established today  Interventions related to Atrial Fibrillation, HTN, HLD, DMII, and Chronic Back Pain :  . Evaluation of current treatment plans and patient's adherence to plan as established by provider.  The patient is going on May 25th to have a "spine pain stimulator" placed.  He will go back on June the 1st and if this is working for him it will be permanently implanted. The patient is hopeful this will give him much needed relief related to his chronic back pain. His pain level is tolerable when he is sitting down but when he is walking it is almost "unbearable".  Education and support. 08-06-2019: the patient had surgery and has the stimulator placed. Has had issues with urinary retention and is presently wearing a foley catheter. Has had 2 trials without the foley and can not urinate on his own. The patient is frustrated about this but he will continue to work with the providers and hopefully he will be able to urinate without the catheter before long.  . Assessed patient understanding of disease states- the patient is very knowledgeable about his health conditions but chronic back pain limits him in managing his care effectively. Encouraged the patient to monitor his blood pressure and blood sugars more frequently.  The patient had a recent hemoglobin A1C of 6.5.  He will continue to take his readings and record findings. He will bring at his next scheduled appointment. Endorses compliance with regimen to manage his chronic conditions.  . Assessed patient's education and care coordination needs- education and support on monitoring his dietary intake- watching his sodium and sugar. Will continue to encourage healthy food choices.  Patient verbalized he has gained weight and his weight is 197 now.  . Assessed safety when ambulating. The patient is using a "crutch" when he walks. The patient  verbalized he would not be able to walk if he did not use his crutch. Encouraged the patient to pace his activities and know his limitations. The patient does not like to take pain medications and "deals with the pain".  Encouraged the patient to listen to his body and take breaks as needed to help with pain management. 08-06-2019: the patient is still having a lot of pain when ambulating. The patient is only at level 2 on his stimulator and it has 35 levels. He is working with the manufacture to change settings and hopeful this will help him with his pain level. The patient is having a hard time right now post surgery but is remaining positive. Empathetic listening and support given.  . Provided disease specific education to patient- Information on DASH and ADA diets provided by the MyChart function- completed.  Will search for information related to spine pain stimulator to send by MyChart for the patient.  . Review of Vitamin B12 supplement as recommended by pcp. The patient has started taking the supplement of Vitamin B12 as recommended by the pcp. 08-06-2019: The patient was concerned about low levels of hemoglobin and hematocrit.  Education given. Read the notes to the patient from the pcp and the patient verbalized understanding. He will call his surgeon if needed for further investigation of levels. Reminded the patient of his appointment with Jolene on 08-28-2019 with repeat lab work. The patient states he has so much going on.  He will just be glad when he feels better.  Nash Dimmer with appropriate clinical care team members regarding patient needs  Patient Self Care Activities related to Atrial Fibrillation, HTN, HLD, DMII, and Chronic Back Pain :  . Patient is unable to independently self-manage chronic health conditions  Please see past updates related to this goal by clicking on the "Past Updates" button in the selected goal        Depression Screen PHQ 2/9 Scores 09/08/2019 01/20/2019  08/12/2018 07/27/2017 05/02/2017 12/20/2016 08/10/2016  PHQ - 2 Score 0 0 0 0 0 0 0  PHQ- 9 Score - - - - 0 - -    Fall Risk Fall Risk  09/08/2019 02/03/2019 08/12/2018 03/06/2018 07/27/2017  Falls in the past year? 0 0 0 0 No  Number falls in past yr: - - - - -  Injury with Fall? - - - - -  Risk Factor Category  - - - - -  Risk for fall due to : Medication side effect;Impaired mobility - - - -  Follow up Falls evaluation completed;Education provided;Falls prevention discussed - - Falls evaluation completed -    Any stairs in or around the home? Yes  If so, are there any without handrails? Yes  Home free of loose throw rugs in walkways, pet beds, electrical cords, etc? Yes  Adequate lighting in your home to reduce risk of falls? Yes   ASSISTIVE DEVICES UTILIZED TO PREVENT FALLS:  Life alert? No  Use of a cane, walker or w/c? Yes  Grab bars in the bathroom? Yes  Shower chair or bench in shower? Yes  Elevated toilet seat or a handicapped toilet? Yes   TIMED UP AND GO:  Was the test performed? No . .    Cognitive Function:     6CIT Screen 09/08/2019 01/20/2019 08/12/2018 07/27/2017 07/26/2016  What Year? 0 points 0 points 0 points 0 points 0 points  What month? 0 points 0 points 0 points 0 points 0 points  What time? 0 points 0 points 0 points 0 points 0 points  Count back from 20 0 points 0 points 0 points 0 points 0 points  Months in reverse 0 points 0 points 0 points 0 points 0 points  Repeat phrase 2 points 0 points 0 points 0 points 0 points  Total Score 2 0 0 0 0    Immunizations Immunization History  Administered Date(s) Administered  . Influenza, High Dose Seasonal PF 11/16/2015, 12/05/2016  . Influenza,inj,Quad PF,6+ Mos 12/01/2014  . Influenza-Unspecified 11/05/2017, 11/14/2018  . PFIZER SARS-COV-2 Vaccination 02/24/2019, 03/17/2019  . Pneumococcal Conjugate-13 09/03/2013  . Pneumococcal Polysaccharide-23 06/02/2015  . Td 06/02/2015  . Zoster 02/14/2011  . Zoster  Recombinat (Shingrix) 08/28/2017, 12/06/2017, 01/01/2018    TDAP status: Up to date Flu Vaccine status: Up to date Pneumococcal vaccine status: Up to date Covid-19 vaccine status: Completed vaccines  Qualifies for Shingles Vaccine? Yes   Zostavax completed Yes   Shingrix Completed?: Yes  Screening Tests Health Maintenance  Topic Date Due  . Hepatitis C Screening  Never done  . INFLUENZA VACCINE  09/14/2019  . FOOT EXAM  01/20/2020  . HEMOGLOBIN A1C  02/20/2020  . OPHTHALMOLOGY EXAM  05/13/2020  . TETANUS/TDAP  06/01/2025  . COVID-19 Vaccine  Completed  . PNA vac Low Risk Adult  Completed    Health Maintenance  Health Maintenance Due  Topic Date Due  . Hepatitis C Screening  Never done    Colorectal cancer screening:  No longer required.   Lung Cancer Screening: (Low Dose CT Chest recommended if Age 52-80 years, 30 pack-year currently smoking OR have quit w/in 15years.) does not qualify.   Lung Cancer Screening Referral: no  Additional Screening:  Hepatitis C Screening: does qualify;  Vision Screening: Recommended annual ophthalmology exams for early detection of glaucoma and other disorders of the eye. Is the patient up to date with their annual eye exam?  Yes  Who is the provider or what is the name of the office in which the patient attends annual eye exams? Dr. Atilano Median If pt is not established with a provider, would they like to be referred to a provider to establish care? No .   Dental Screening: Recommended annual dental exams for proper oral hygiene  Community Resource Referral / Chronic Care Management: CRR required this visit?  No   CCM required this visit?  No      Plan:     I have personally reviewed and noted the following in the patient's chart:   . Medical and social history . Use of alcohol, tobacco or illicit drugs  . Current medications and supplements . Functional ability and status . Nutritional status . Physical activity . Advanced  directives . List of other physicians . Hospitalizations, surgeries, and ER visits in previous 12 months . Vitals . Screenings to include cognitive, depression, and falls . Referrals and appointments  In addition, I have reviewed and discussed with patient certain preventive protocols, quality metrics, and best practice recommendations. A written personalized care plan for preventive services as well as general preventive health recommendations were provided to patient.     Kellie Simmering, LPN   [id number]   Nurse Notes: Due for Hep C screening.

## 2019-09-12 ENCOUNTER — Telehealth: Payer: Self-pay | Admitting: Nurse Practitioner

## 2019-09-12 NOTE — Telephone Encounter (Signed)
Pt wants advice he is having's concerns about going on vacation, His granddaughter is a Pharmacist, hospital and  a girl in her class has a brother who  tested positive for covid.Pt is concerned due to  if he goes on vacation grandaughter will be with him .Pt has been vaccinated and wants advice as to if he should go or not on vaction.

## 2019-09-12 NOTE — Telephone Encounter (Signed)
Called and spoke with patient. He stated granddaughter is around 77 years old and yes, she has been fully vaccinated. Also, pt stated granddaughter's student who is 70-82 years old tested negative for covid.

## 2019-09-12 NOTE — Telephone Encounter (Signed)
Discussed with him. Contact with grand-daughter was negative for COVID, granddaughter has been fully vaccinated. Not driving together and not staying in same house. Discussed low risk of transmission, but to avoid grand daughter if she starts feeling sick

## 2019-09-12 NOTE — Telephone Encounter (Signed)
Routing to provider  

## 2019-09-12 NOTE — Telephone Encounter (Signed)
How old is his granddaughter and has she been vaccinated?

## 2019-09-16 DIAGNOSIS — R339 Retention of urine, unspecified: Secondary | ICD-10-CM | POA: Diagnosis not present

## 2019-09-17 ENCOUNTER — Ambulatory Visit (INDEPENDENT_AMBULATORY_CARE_PROVIDER_SITE_OTHER): Payer: Medicare Other | Admitting: General Practice

## 2019-09-17 ENCOUNTER — Telehealth: Payer: Medicare Other | Admitting: General Practice

## 2019-09-17 DIAGNOSIS — E1159 Type 2 diabetes mellitus with other circulatory complications: Secondary | ICD-10-CM

## 2019-09-17 DIAGNOSIS — N1831 Chronic kidney disease, stage 3a: Secondary | ICD-10-CM | POA: Diagnosis not present

## 2019-09-17 DIAGNOSIS — I10 Essential (primary) hypertension: Secondary | ICD-10-CM

## 2019-09-17 DIAGNOSIS — E1121 Type 2 diabetes mellitus with diabetic nephropathy: Secondary | ICD-10-CM | POA: Diagnosis not present

## 2019-09-17 DIAGNOSIS — I4891 Unspecified atrial fibrillation: Secondary | ICD-10-CM | POA: Diagnosis not present

## 2019-09-17 DIAGNOSIS — I152 Hypertension secondary to endocrine disorders: Secondary | ICD-10-CM

## 2019-09-17 DIAGNOSIS — M5416 Radiculopathy, lumbar region: Secondary | ICD-10-CM

## 2019-09-17 DIAGNOSIS — E1169 Type 2 diabetes mellitus with other specified complication: Secondary | ICD-10-CM

## 2019-09-17 DIAGNOSIS — M961 Postlaminectomy syndrome, not elsewhere classified: Secondary | ICD-10-CM

## 2019-09-17 DIAGNOSIS — E785 Hyperlipidemia, unspecified: Secondary | ICD-10-CM | POA: Diagnosis not present

## 2019-09-17 DIAGNOSIS — I480 Paroxysmal atrial fibrillation: Secondary | ICD-10-CM

## 2019-09-17 DIAGNOSIS — R339 Retention of urine, unspecified: Secondary | ICD-10-CM

## 2019-09-17 DIAGNOSIS — E1122 Type 2 diabetes mellitus with diabetic chronic kidney disease: Secondary | ICD-10-CM

## 2019-09-17 DIAGNOSIS — G8929 Other chronic pain: Secondary | ICD-10-CM

## 2019-09-17 NOTE — Patient Instructions (Signed)
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 Currently using self catheterization practices  High risk for frequent urinary tract infections due to urinary retention and having to do self catheterization daily  Nurse Case Manager Clinical Goal(s):   Over the next 120 days, patient will verbalize understanding of plan for urinary retention and continued self catheterization practices  Over the next 120 days, patient will work with [facility], specialist, and pcp to address needs related to urinary retention and best practices for urinary retention  Over the next 120 days, patient will demonstrate a decrease in UTI exacerbations as evidenced by no new UTI and independent voiding increased without the need of self catheterization  Over the next 120 days, patient will attend all scheduled medical appointments: next appointment with pcp on 10-02-2019.  Over the next 120 days, patient will demonstrate improved adherence to prescribed treatment plan for UTI and urinary retention as evidenced byno new UTI and increased independent voiding of urine  Interventions:   Inter-disciplinary care team collaboration (see longitudinal plan of care)  Evaluation of current treatment plan related to urinary retention and UTI's and patient's  adherence to plan as established by provider.  Advised patient to call the provider for changes in urinary patterns and suspected UTIs  Provided education to patient re: self catheterization practices and reducing the risk of infection  Collaborated with pcp  regarding patients urinary retention and UTIs as needed  Discussed plans with patient for ongoing care management follow up and provided patient with direct contact information for care management team  Provided patient with self catheterization and urinary retention  educational materials related to urinary retention and frequent UTIs through the EMMI system and My Chart system  Reviewed scheduled/upcoming provider appointments including: 10-02-2019 with pcp  Patient Self Care Activities:   Patient verbalizes understanding of plan to work with [facility], specialist, and pcp to help with urinary retention and UTI concerns and best practices for managing urinary health  Calls provider office for new concerns or questions  Unable to independently void as evidence of urinary retention with the need to self catheterize post pain stimulator insertion in May of 2021.  Initial goal documentation       RNCM: Pt-"I do the best I can" (pt-stated)        CARE PLAN ENTRY (see longtitudinal plan of care for additional care plan information)  Current Barriers:   Chronic Disease Management support, education, and care coordination needs related to Atrial Fibrillation, HTN, HLD, DMII, and Chronic Back Pain  Limited mobility related to chronic back pain and discomfort  Clinical Goal(s) related to Atrial Fibrillation, HTN, HLD, DMII, and Chronic  Back Pain :  Over the next 120 days, patient will:   Work with the care management team to  address educational, disease management, and care coordination needs   Begin or continue self health monitoring activities as directed today Measure and record cbg (blood glucose) 1 times daily, Measure and record  blood pressure 3 times per week, and follow a Heart Healthy/ADA diet  Call provider office for new or worsened signs and symptoms Blood glucose findings outside established parameters, Blood pressure findings outside established parameters, Shortness of breath, and New or worsened symptom related to AFIB and Chronic back pain  Call care management team with questions or concerns  Verbalize basic understanding of patient centered plan of care established today  Interventions related to Atrial Fibrillation, HTN, HLD, DMII, and Chronic Back Pain :   Evaluation of current treatment plans and patient's adherence to plan as established by provider.  The patient is going on May 25th to have a "spine pain stimulator" placed.  He will go back on June the 1st and if this is working for him it will be permanently implanted. The patient is hopeful this will give him much needed relief related to his chronic back pain. His pain level is tolerable when he is sitting down but when he is walking it is almost "unbearable".  Education and support. 08-06-2019: the patient had surgery and has the stimulator placed. Has had issues with urinary retention and is presently wearing a foley catheter. Has had 2 trials without the foley and can not urinate on his own. The patient is frustrated about this but he will continue to work with the providers and hopefully he will be able to urinate without the catheter before long. 09-17-2019: The patient states that he is still having pain but is only on level 5 of the stimulator and the stimulator has 35 programs. He is walking without a cane at this time and that is an improvement. He is hopeful that he will be able to do more independently and not have pain.    Assessed patient understanding of disease states- the patient is very knowledgeable about his health conditions but chronic back pain limits him in managing his care effectively. Encouraged the patient to monitor his blood  pressure and blood sugars more frequently.  The patient had a recent hemoglobin A1C of 6.3.  He will continue to take his readings and record findings. He will bring at his next scheduled appointment. Endorses compliance with regimen to manage his chronic conditions. 09-17-2019: The patient states that he has been taking his blood pressures but not writing down. He says systolic they are [id number] and diastolic they are 63-33.  The patient denies any issues with blood pressure at this time.   Assessed patient's education and care coordination needs- education and support on monitoring his dietary intake- watching his sodium and sugar. Will continue to encourage healthy food choices.  Patient verbalized he has gained weight and his weight is 197 now.   Assessed safety when ambulating. The patient is using a "crutch" when he walks. The patient verbalized he would not be able to walk if he did not use his crutch. Encouraged the patient to pace his activities and know his limitations. The patient does not like to take pain medications and "deals with the pain".  Encouraged the patient to listen to his body and take breaks as needed to help with pain management. 08-06-2019: the patient is still having a lot of pain when ambulating. The patient is only at level 2 on his stimulator and it has 35 levels. He is working with the  manufacture to change settings and hopeful this will help him with his pain level. The patient is having a hard time right now post surgery but is remaining positive. Empathetic listening and support given. 09-17-2019: The patient states he is still having pain but is not having to use a cane when he walks. This is an improvement. He is optimistic that the stimulator at some point is going to make a difference in the pain he has in his back. The patient is currently at level 5 of 35 settings. Continued support and education.   Provided disease specific education to patient- Information on DASH and ADA  diets provided by the MyChart function- completed.  Will search for information related to spine pain stimulator to send by MyChart for the patient.   Review of Vitamin B12 supplement as recommended by pcp. The patient has started taking the supplement of Vitamin B12 as recommended by the pcp. 08-06-2019: The patient was concerned about low levels of hemoglobin and hematocrit.  Education given. Read the notes to the patient from the pcp and the patient verbalized understanding. He will call his surgeon if needed for further investigation of levels. Reminded the patient of his appointment with Jolene on 08-28-2019 with repeat lab work. The patient states he has so much going on. He will just be glad when he feels better.   Collaborated with appropriate clinical care team members regarding patient needs. Knows of the CCM team for support for support. Denies any needs for pharmacist or LCSW at this time.   Evaluation of next appointment with pcp: 10-02-2019. CCM team RNCM will follow up with the patient on 11-19-2019 at 9 am.  The patient will call for needs or changes before next outreach.   Patient Self Care Activities related to Atrial Fibrillation, HTN, HLD, DMII, and Chronic Back Pain :   Patient is unable to independently self-manage chronic health conditions  Please see past updates related to this goal by clicking on the "Past Updates" button in the selected goal         Patient verbalizes understanding of instructions provided today.   Telephone follow up appointment with care management team member scheduled for: 11-19-2019 at 0900  [place], MSN, [facility] Mobile: [phone]    Clean Intermittent Catheterization, Male  Clean intermittent catheterization (CIC) is a procedure to remove urine from the bladder by placing a small, flexible tube (catheter) into the bladder though the urethra. The  urethra is a tube in the body that carries urine from the bladder out of the body. CIC may be done when: You cannot completely empty your bladder on your own. This may be due to a blockage in the bladder or urethra. Your bladder leaks urine. This may happen when the muscles or nerves near the bladder are not working normally, so the bladder overflows. Your health care provider will show you how to perform CIC and will help you to become comfortable performing this procedure at home. Your health care provider will also help you to get the home care supplies that are needed for this procedure. Supplies needed: Germ-free (sterile), water-based lubricant. A container for urine collection. You may also use the toilet to dispose of urine from the catheter. A catheter. Your health care provider will determine the best size for you. Use this catheter size: ______________________________ Sterile gloves. Sterile gauze. Medicated sterile swabs. How to perform this procedure: Most people  need CIC at least 4 times per day to adequately empty the bladder. Your health care provider will tell you how often you should perform CIC. Number of times per day to perform CIC: ______________________________________________________________________ To perform CIC, follow these steps: Wash your hands with soap and water. If soap and water are not available, use hand sanitizer. Prepare the supplies that you will use during the procedure. Open the catheter pack, the lubricant, and the pack of medicated sterile swabs. If you have been told to keep the procedure sterile, do not touch your supplies until you are wearing gloves. Get in a comfortable position. Possible positions include: Sitting on a toilet, a chair, or the edge of a bed. Standing near a toilet. Lying down with your head raised on pillows and your knees pointing to the ceiling. You may wish to place a waterproof mat or pad under you. If you are using a urine  collection container, position it between your legs. Urinate, if you are able. Put on gloves. Apply lubricant to about 2 inches (5 cm) of the tip of the catheter. Set the catheter down on a clean, dry surface within reach. Gently stretch your penis out from your body. Pull back any skin that covers the end of your penis (foreskin). Clean the end of your penis with medicated sterile swabs as told by your health care provider. Hold your penis upward at a 45-60 degree angle. This helps to straighten the urethra. Slowly insert the lubricated catheter straight into your urethra until urine flows freely. This is usually about 6-8 inches (15-20 cm). When urine starts to flow freely, insert the catheter 1 inch (3 cm) more. Allow urine to drain into the toilet or the urine collection container. When urine stops flowing, slowly remove the catheter. Note the color, amount, and odor of the urine. Measure your urine and note the amount, if told by your health care provider. Discard the urine in the toilet. Clean your penis using soap and water. Move the foreskin back in place, if applicable. If you are using a single-use catheter, discard the catheter and supplies. Wash your hands with soap and water. If you are using a reusable catheter, follow package instructions about how to clean the catheter after each use. How often should I perform this procedure? Do CIC to empty your bladder every 4-6 hours or as often as told by your health care provider. If you have symptoms of too much urine in your bladder (overdistension) and you are not able to urinate, perform CIC. Symptoms of overdistension may include: Restlessness. Sweating or chills. Headache. Flushed or pale skin. Bloated lower abdomen. What are the risks? Generally, this is a safe procedure, however problems may occur, including: Infection. Injury to the urethra. Irritation of the urethra. Follow these instructions at home  General  instructions Drink enough fluid to keep your urine pale yellow. Dispose of a multiple use catheter when it becomes dry, brittle, or cloudy. This usually happens after you use the catheter for 1 week. Avoid caffeine. Caffeine may make you need to urinate more frequently and more urgently. When traveling, bring extra supplies with you in case of delays. Keep supplies with you in a place that you can access easily. If traveling by plane: Make sure that the lubricant in your carry-on bag is less than 3.4 ounces (100 mL). Use a single-use catheter. It may be difficult to clean a reusable catheter in a small bathroom. Take over-the-counter and prescription medicines only as told  by your health care provider. Keep all follow-up visits as told by your health care provider. This is important. Contact a health care provider if you: Have difficulty performing CIC. Have urine leaking during CIC. Have: Dark or cloudy urine. Blood in your urine or in your catheter. A change in the smell of your urine or discharge. A burning feeling while you urinate. Feel nauseous or you vomit. Have pain in your abdomen, your back, or your sides below your ribs. Have swelling or redness around the opening of your urethra. Develop a rash or sores on your skin. Get help right away if you have: A fever. Symptoms that do not go away after 3 days. Symptoms that suddenly get worse. Severe pain. A decrease in the amount of urine that drains from your bladder. Summary Clean intermittent catheterization (CIC) is a procedure to remove urine from the bladder by placing a small, flexible tube (catheter) into the bladder though the urethra. Your health care provider will show you how to perform CIC and will help you to become comfortable performing this procedure at home. Most people need CIC at least 4 times per day to adequately empty the bladder. This information is not intended to replace advice given to you by your health  care provider. Make sure you discuss any questions you have with your health care provider. Document Revised: 05/22/2018 Document Reviewed: 09/20/2017 Elsevier Patient Education  [place].  Acute Urinary Retention, Male  Acute urinary retention means that you cannot pee (urinate) at all, or that you pee too little and your bladder is not emptied completely. If it is not treated, it can lead to kidney damage or other serious problems. Follow these instructions at home:  Take over-the-counter and prescription medicines only as told by your doctor. Ask your doctor what medicines you should stay away from. Do not take any medicine unless your doctor says it is okay to do so.  If you were sent home with a tube that drains the bladder (catheter), take care of it as told by your doctor.  Drink enough fluid to keep your pee clear or pale yellow.  If you were given an antibiotic, take it as told by your doctor. Do not stop taking the antibiotic even if you start to feel better.  Do not use any products that contain nicotine or tobacco, such as cigarettes and e-cigarettes. If you need help quitting, ask your doctor.  Watch for changes in your symptoms. Tell your doctor about them.  If told, track changes in your blood pressure at home. Tell your doctor about them.  Keep all follow-up visits as told by your doctor. This is important. Contact a doctor if:  You have spasms or you leak pee when you have spasms. Get help right away if:  You have chills or a fever.  You have a tube that drains the bladder and: ? The tube stops draining pee. ? The tube falls out.  You have blood in your pee. Summary  Acute urinary retention means that you have problems peeing. It may mean that you cannot pee at all, or that you pee too little.  If this condition is not treated, it can lead to kidney damage or other serious problems.  If you were sent home with a tube that drains the bladder, take  care of it as told by your doctor.  Monitor any changes in your symptoms. Tell your doctor about any changes. This information is not intended to  replace advice given to you by your health care provider. Make sure you discuss any questions you have with your health care provider. Document Revised: 04/18/2018 Document Reviewed: 03/03/2016 Elsevier Patient Education  [place].

## 2019-09-17 NOTE — Chronic Care Management (AMB) (Signed)
Chronic Care Management   Follow Up Note   09/17/2019 Name: Youssef Footman. MRN: [id number] DOB: [birth year]  Referred by: [name], NP Reason for referral : Chronic Care Management (RNCM follow up call for chronic disease management and care coordiantion needs.)   Cordie Buening. is a 79 y.o. year old male who is a primary care patient of [name], [name], NP. The CCM team was consulted for assistance with chronic disease management and care coordination needs.    Review of patient status, including review of consultants reports, relevant laboratory and other test results, and collaboration with appropriate care team members and the patient's provider was performed as part of comprehensive patient evaluation and provision of chronic care management services.    SDOH (Social Determinants of Health) assessments performed: Yes See Care Plan activities for detailed interventions related to [facility])     Outpatient Encounter Medications as of 09/17/2019  Medication Sig  . acetaminophen (TYLENOL) 500 MG tablet Take 500 mg by mouth every 6 (six) hours as needed (pain).  . benazepril (LOTENSIN) 40 MG tablet Take 1 tablet (40 mg total) by mouth daily.  . cetirizine (ZYRTEC) 10 MG tablet Take 1 tablet (10 mg total) by mouth daily.  [name] ELIQUIS 5 MG TABS tablet Take 1 tablet (5 mg total) by mouth 2 (two) times daily.  . finasteride (PROSCAR) 5 MG tablet Take 1 tablet (5 mg total) by mouth daily.  . fluticasone (FLONASE) 50 MCG/ACT nasal spray Place 2 sprays into both nostrils 2 (two) times daily.  . metFORMIN (GLUCOPHAGE-XR) 500 MG 24 hr tablet Take 2 tablets (1,000 mg total) by mouth 2 (two) times daily.  . metoprolol succinate (TOPROL-XL) 25 MG 24 hr tablet Take 1 tablet by mouth in the morning and at bedtime.   . ondansetron (ZOFRAN ODT) 4 MG disintegrating tablet Take 1 tablet (4 mg total) by mouth every 8 (eight) hours as needed for nausea or vomiting. (Patient not taking: Reported on  07/30/2019)  . pantoprazole (PROTONIX) 40 MG tablet Take 40 mg by mouth at bedtime.   . propafenone (RYTHMOL SR) 425 MG 12 hr capsule Take 425 mg by mouth 2 (two) times daily.  . tamsulosin (FLOMAX) 0.4 MG CAPS capsule Take 0.8 mg by mouth daily.   No facility-administered encounter medications on file as of 09/17/2019.     Objective:  BP Readings from Last 3 Encounters:  08/20/19 133/71  07/30/19 128/68  06/25/19 138/86    Goals Addressed              This Visit's Progress   .  RNCM: Pt-"I am doing self catheterization about 3 times a day now" (pt-stated)        CARE PLAN ENTRY (see longitudinal plan of care for additional care plan information)  Current Barriers:  [name] Knowledge Deficits related to self catheterization needs related to urinary retention after pain stimulator inserted in May of 2021 for chronic back pain . 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 Currently using self catheterization practices . High risk for frequent urinary tract infections due to urinary retention and having to do self catheterization daily  Nurse Case Manager Clinical Goal(s):  [name] Over the next 120 days, patient will verbalize understanding of plan for urinary  retention and continued self catheterization practices . Over the next 120 days, patient will work with [facility], specialist, and pcp to address needs related to urinary retention and best practices for urinary retention . Over the next 120 days, patient will demonstrate a decrease in UTI exacerbations as evidenced by no new UTI and independent voiding increased without the need of self catheterization . Over the next 120 days, patient will attend all scheduled medical appointments: next appointment  with pcp on 10-02-2019. [name] Over the next 120 days, patient will demonstrate improved adherence to prescribed treatment plan for UTI and urinary retention as evidenced byno new UTI and increased independent voiding of urine  Interventions:  . Inter-disciplinary care team collaboration (see longitudinal plan of care) . Evaluation of current treatment plan related to urinary retention and UTI's and patient's adherence to plan as established by provider. . Advised patient to call the provider for changes in urinary patterns and suspected UTIs . Provided education to patient re: self catheterization practices and reducing the risk of infection . Collaborated with pcp  regarding patients urinary retention and UTIs as needed . Discussed plans with patient for ongoing care management follow up and provided patient with direct contact information for care management team . Provided patient with self catheterization and urinary retention  educational materials related to urinary retention and frequent UTIs through the EMMI system and My Chart system . Reviewed scheduled/upcoming provider appointments including: 10-02-2019 with pcp  Patient Self Care Activities:  . Patient verbalizes understanding of plan to work with [facility] specialist, and pcp to help with urinary retention and UTI concerns and best practices for managing urinary health . Calls provider office for new concerns or questions . Unable to independently void as evidence of urinary retention with the need to self catheterize post pain stimulator insertion in May of 2021.  Initial goal documentation     .  RNCM: Pt-"I do the best I can" (pt-stated)        CARE PLAN ENTRY (see longtitudinal plan of care for additional care plan information)  Current Barriers:  . Chronic Disease Management support, education, and care coordination needs related to Atrial Fibrillation, HTN, HLD, DMII, and Chronic Back Pain . Limited mobility related to chronic  back pain and discomfort  Clinical Goal(s) related to Atrial Fibrillation, HTN, HLD, DMII, and Chronic  Back Pain :  Over the next 120 days, patient will:  . Work with the care management team to address educational, disease management, and care coordination needs  . Begin or continue self health monitoring activities as directed today Measure and record cbg (blood glucose) 1 times daily, Measure and record blood pressure 3 times per week, and follow a Heart Healthy/ADA diet . Call provider office for new or worsened signs and symptoms Blood glucose findings outside established parameters, Blood pressure findings outside established parameters, Shortness of breath, and New or worsened symptom related to AFIB and Chronic back pain . Call care management team with questions or concerns . Verbalize basic understanding of patient centered plan of care established today  Interventions related to Atrial Fibrillation, HTN, HLD, DMII, and Chronic Back Pain :  . Evaluation of current treatment plans and patient's adherence to plan as established by provider.  The patient is going on May 25th to have a "spine pain stimulator" placed.  He will go back on June the 1st and if this is working for him it will be permanently implanted. The patient is hopeful this will give him  much needed relief related to his chronic back pain. His pain level is tolerable when he is sitting down but when he is walking it is almost "unbearable".  Education and support. 08-06-2019: the patient had surgery and has the stimulator placed. Has had issues with urinary retention and is presently wearing a foley catheter. Has had 2 trials without the foley and can not urinate on his own. The patient is frustrated about this but he will continue to work with the providers and hopefully he will be able to urinate without the catheter before long. 09-17-2019: The patient states that he is still having pain but is only on level 5 of the stimulator and  the stimulator has 35 programs. He is walking without a cane at this time and that is an improvement. He is hopeful that he will be able to do more independently and not have pain.   . Assessed patient understanding of disease states- the patient is very knowledgeable about his health conditions but chronic back pain limits him in managing his care effectively. Encouraged the patient to monitor his blood pressure and blood sugars more frequently.  The patient had a recent hemoglobin A1C of 6.3.  He will continue to take his readings and record findings. He will bring at his next scheduled appointment. Endorses compliance with regimen to manage his chronic conditions. 09-17-2019: The patient states that he has been taking his blood pressures but not writing down. He says systolic they are 737-106 and diastolic they are 26-94.  The patient denies any issues with blood pressure at this time.  . Assessed patient's education and care coordination needs- education and support on monitoring his dietary intake- watching his sodium and sugar. Will continue to encourage healthy food choices.  Patient verbalized he has gained weight and his weight is 197 now.  . Assessed safety when ambulating. The patient is using a "crutch" when he walks. The patient verbalized he would not be able to walk if he did not use his crutch. Encouraged the patient to pace his activities and know his limitations. The patient does not like to take pain medications and "deals with the pain".  Encouraged the patient to listen to his body and take breaks as needed to help with pain management. 08-06-2019: the patient is still having a lot of pain when ambulating. The patient is only at level 2 on his stimulator and it has 35 levels. He is working with the manufacture to change settings and hopeful this will help him with his pain level. The patient is having a hard time right now post surgery but is remaining positive. Empathetic listening and support  given. 09-17-2019: The patient states he is still having pain but is not having to use a cane when he walks. This is an improvement. He is optimistic that the stimulator at some point is going to make a difference in the pain he has in his back. The patient is currently at level 5 of 35 settings. Continued support and education.  . Provided disease specific education to patient- Information on DASH and ADA diets provided by the MyChart function- completed.  Will search for information related to spine pain stimulator to send by MyChart for the patient.  . Review of Vitamin B12 supplement as recommended by pcp. The patient has started taking the supplement of Vitamin B12 as recommended by the pcp. 08-06-2019: The patient was concerned about low levels of hemoglobin and hematocrit.  Education given. Read the notes to  the patient from the pcp and the patient verbalized understanding. He will call his surgeon if needed for further investigation of levels. Reminded the patient of his appointment with Jolene on 08-28-2019 with repeat lab work. The patient states he has so much going on. He will just be glad when he feels better.  Nash Dimmer with appropriate clinical care team members regarding patient needs. Knows of the CCM team for support for support. Denies any needs for pharmacist or LCSW at this time.  . Evaluation of next appointment with pcp: 10-02-2019. CCM team RNCM will follow up with the patient on 11-19-2019 at 9 am.  The patient will call for needs or changes before next outreach.   Patient Self Care Activities related to Atrial Fibrillation, HTN, HLD, DMII, and Chronic Back Pain :  . Patient is unable to independently self-manage chronic health conditions  Please see past updates related to this goal by clicking on the "Past Updates" button in the selected goal          Plan:   Telephone follow up appointment with care management team member scheduled for: 11-19-2019 at 9 am   [place],  MSN, [facility] Mobile: [phone]

## 2019-09-20 ENCOUNTER — Other Ambulatory Visit: Payer: Self-pay | Admitting: Family Medicine

## 2019-09-20 NOTE — Telephone Encounter (Signed)
Requested Prescriptions  Pending Prescriptions Disp Refills  . cetirizine (ZYRTEC) 10 MG tablet [Pharmacy Med Name: CETIRIZINE HCL 10 MG TABLET] 90 tablet 1    Sig: Take 1 tablet (10 mg total) by mouth daily.     Ear, Nose, and Throat:  Antihistamines Passed - 09/20/2019  9:19 AM      Passed - Valid encounter within last 12 months    Recent Outpatient Visits          1 month ago Type 2 diabetes mellitus with stage 3a chronic kidney disease, without long-term current use of insulin ([place])   [place], [place] T, NP   1 month ago Fatigue, unspecified type   [employer], Jolene T, NP   5 months ago Type 2 diabetes mellitus with stage 3a chronic kidney disease, without long-term current use of insulin ([place])   [place], [place] T, NP   6 months ago Neck fullness   [place], [place]   8 months ago Annual physical exam   [place], [name], NP      Future Appointments            In 1 week [name], [name], NP [employer], [place]   In 19 month Leary, [name], NP [employer]   In 71 months  [facility]

## 2019-10-01 ENCOUNTER — Ambulatory Visit
Admission: EM | Admit: 2019-10-01 | Discharge: 2019-10-01 | Disposition: A | Discharge status: Home / Self Care | Payer: Medicare Other | Attending: Internal Medicine | Admitting: Internal Medicine

## 2019-10-01 ENCOUNTER — Other Ambulatory Visit: Payer: Self-pay

## 2019-10-01 ENCOUNTER — Ambulatory Visit: Payer: Self-pay

## 2019-10-01 DIAGNOSIS — R05 Cough: Secondary | ICD-10-CM

## 2019-10-01 DIAGNOSIS — U071 COVID-19: Secondary | ICD-10-CM | POA: Insufficient documentation

## 2019-10-01 DIAGNOSIS — R058 Other specified cough: Secondary | ICD-10-CM

## 2019-10-01 DIAGNOSIS — Z20822 Contact with and (suspected) exposure to covid-19: Secondary | ICD-10-CM | POA: Diagnosis not present

## 2019-10-01 MED ORDER — ONDANSETRON HCL 4 MG PO TABS: 4.0000 mg | ORAL_TABLET | Freq: Three times a day (TID) | ORAL | 0 refills | Status: DC | PRN

## 2019-10-01 MED ORDER — BENZONATATE 100 MG PO CAPS
100.0000 mg | ORAL_CAPSULE | Freq: Three times a day (TID) | ORAL | 0 refills | Status: DC
Start: 2019-10-01 — End: 2019-10-22

## 2019-10-01 NOTE — Telephone Encounter (Signed)
Patient called and says his daughter who lives with them, tested COVID positive, received results today. He says since Friday he's been having symptoms-diarrhea and dry cough. He denies fever, chills, SOB or any other COVID symptoms. He says the diarrhea is better, just a little diarrhea. He says he feels worse than he did Sunday and Monday. He says he feels worn out. He says he's on the way now to get tested. He asks is there anything he can take to help with the symptoms. I advised drinking plenty fluids, Robitussin for cough, Tylenol or Ibuprofen for pain or fever. I advised he will need a virtual appointment instead of in office visit that is already scheduled for tomorrow. I advised someone from the office will call to discuss the schedule on tomorrow, he verbalized understanding.   Reason for Disposition . [1] HIGH RISK patient (e.g., age > 63 years, diabetes, heart or lung disease, weak immune system) AND [2] new or worsening symptoms  Answer Assessment - Initial Assessment Questions 1. COVID-19 DIAGNOSIS: "Who made your Coronavirus (COVID-19) diagnosis?" "Was it confirmed by a positive lab test?" If not diagnosed by a HCP, ask "Are there lots of cases (community spread) where you live?" (See public health department website, if unsure)     N/A 2. COVID-19 EXPOSURE: "Was there any known exposure to [place] before the symptoms began?" CDC Definition of close contact: within 6 feet (2 meters) for a total of 15 minutes or more over a 24-hour period.      Yes, my daughter who lives with him tested positive today 3. ONSET: "When did the COVID-19 symptoms start?"      Friday, 09/26/19 4. WORST SYMPTOM: "What is your worst symptom?" (e.g., cough, fever, shortness of breath, muscle aches)     Diarrhea, dry cough 5. COUGH: "Do you have a cough?" If Yes, ask: "How bad is the cough?"       Yes. Cough is off and on, sometimes pretty bad 6. FEVER: "Do you have a fever?" If Yes, ask: "What is your temperature,  how was it measured, and when did it start?"     No 7. RESPIRATORY STATUS: "Describe your breathing?" (e.g., shortness of breath, wheezing, unable to speak)      No breathing problems 8. BETTER-SAME-WORSE: "Are you getting better, staying the same or getting worse compared to yesterday?"  If getting worse, ask, "In what way?"     Worse than Sunday or Monday 9. HIGH RISK DISEASE: "Do you have any chronic medical problems?" (e.g., asthma, heart or lung disease, weak immune system, obesity, etc.)     Diabetes, A-Fib 10. PREGNANCY: "Is there any chance you are pregnant?" "When was your last menstrual period?"       N/A 11. OTHER SYMPTOMS: "Do you have any other symptoms?"  (e.g., chills, fatigue, headache, loss of smell or taste, muscle pain, sore throat; new loss of smell or taste especially support the diagnosis of COVID-19)       Diarrhea, feels worn out, no energy, sore throat  Protocols used: CORONAVIRUS (COVID-19) DIAGNOSED OR SUSPECTED-A-AH

## 2019-10-01 NOTE — ED Triage Notes (Signed)
Patient in today w/ c/o cough, nasal congestion and drainage, and H/A x 4 days. Patient has had COVID exposure through his daughter who tested positive. Patient has received the [place] 2nd dose received 03/17/2019.

## 2019-10-01 NOTE — Telephone Encounter (Signed)
Called and spoke to patient and daughter, they have been trying to get testing today and unable to find a place to test.  Recommended they try [facility] or [facility].  They are to notify provider of results ASAP, discussed possibility of MAB infusion with them since Friday will be 7 days since symptom onset.  His last vaccination was in February.

## 2019-10-01 NOTE — ED Provider Notes (Signed)
[facility]    CSN: [id number] Arrival date & time: 10/01/19  1721      History   Chief Complaint Chief Complaint  Patient presents with  . Cough  . Nasal Congestion  . Diarrhea  . Headache  . Covid Exposure    HPI Sean Reyes. is a 79 y.o. male comes to the urgent care with cough, nasal congestion, generalized body aches, postnasal drainage and a headache of 4 days duration.  Patient's daughter recently tested positive for COVID-19 virus.  Patient denies any shortness of breath or wheezing.  He has a mild cough.  He has been fully vaccinated against COVID-19 infection.   HPI  Past Medical History:  Diagnosis Date  . Anemia   . Anxiety   . Arthritis   . Atrial fibrillation ([place])   . Cataracts, bilateral   . Complication of anesthesia    pt reports low BP's after surgery at [facility] and difficulty awakening  . Depression   . Diabetes ([place])    dx 6-8 yrs ago  . Dysrhythmia    a-fib  . GERD (gastroesophageal reflux disease)    OCC TAKES ALKA SELTZER  . History of kidney stones    10-15 yrs ago  . 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 Hyperlipidemia   . Hypertension   . Nocturia   . S/P ablation of atrial fibrillation    Ablative therapy  . Sleep apnea    CPAP   . Tachycardia, unspecified     Patient Active Problem List   Diagnosis Date Noted  . Elevated TSH 08/20/2019  . Acute cystitis with hematuria 08/20/2019  . Urinary retention 08/01/2019  . Fatigue 07/30/2019  . CKD (chronic kidney disease) stage 3, GFR 30-59 ml/min 01/19/2019  . Acquired thrombophilia ([place]) 01/19/2019  . 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 Postlaminectomy syndrome, lumbar region 01/16/2019  . History of fusion of lumbar spine (L2-L5) 01/16/2019  . Chronic radicular lumbar pain 01/16/2019  . HNP (herniated nucleus pulposus), lumbar 04/29/2018  . Advanced care planning/counseling discussion 09/28/2016  . Spinal stenosis, lumbar region,  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/13/2016  . Low testosterone 10/07/2015  . Hyperlipidemia associated with type 2 diabetes mellitus ([place]) 07/14/2015  . Anemia 06/30/2015  . 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 OSA (obstructive sleep apnea) 03/23/2015  . Type 2 diabetes mellitus with diabetic chronic kidney disease ([place]) 01/05/2015  . Hypertension associated with diabetes ([place]) 09/28/2014  . 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 Arthritis 10/20/2011  . H/O prior ablation treatment 10/19/2011  . Paroxysmal atrial fibrillation ([place]) 10/19/2011    Past Surgical History:  Procedure Laterality Date  . ABLATION    . ANTERIOR LAT LUMBAR FUSION N/A 06/27/2017   Procedure: Anterior Lateral Lumbar Interbody  Fusion - Lumbar Two-Lumbar Three - Lumbar Three-Lumbar Four, Posterior Lumbar Interbody Fusion Lumbar Four- Five;  Surgeon: Kary Kos, MD;  Location: [place];  Service: Neurosurgery;  Laterality: N/A;  Anterior Lateral Lumbar Interbody  Fusion - Lumbar Two-Lumbar Three - Lumbar Three-Lumbar Four, Posterior Lumbar Interbody Fusion Lumbar Four- Five  . BACK SURGERY    . CARDIOVERSION N/A 08/29/2018   Procedure: CARDIOVERSION;  Surgeon: Corey Skains, MD;  Location: [facility];  Service: Cardiovascular;  Laterality: N/A;  . CARDIOVERSION N/A 09/24/2018   Procedure: CARDIOVERSION;  Surgeon: Corey Skains, MD;  Location: [facility];  Service: Cardiovascular;  Laterality: N/A;  . COLONOSCOPY WITH  PROPOFOL N/A 10/05/2015   Procedure: COLONOSCOPY WITH PROPOFOL;  Surgeon: Lollie Sails, MD;  Location: [facility] ENDOSCOPY;  Service: Endoscopy;  Laterality: N/A;  . ESOPHAGOGASTRODUODENOSCOPY (EGD) WITH PROPOFOL N/A 04/01/2018   Procedure: ESOPHAGOGASTRODUODENOSCOPY (EGD) WITH PROPOFOL;  Surgeon: Lollie Sails, MD;  Location: [facility] ENDOSCOPY;  Service: Endoscopy;  Laterality: N/A;  . HERNIA REPAIR    . JOINT REPLACEMENT Bilateral    hips  RT+  LEFT X2     . LUMBAR LAMINECTOMY/DECOMPRESSION MICRODISCECTOMY Left 09/13/2016   Procedure: Microdiscectomy - Lumbar two-three,  Lumbar three- - left;  Surgeon: Kary Kos, MD;  Location: [place];  Service: Neurosurgery;  Laterality: Left;  . SPINAL CORD STIMULATOR INSERTION  07/08/2019  . TONSILLECTOMY         Home Medications    Prior to Admission medications   Medication Sig Start Date End Date Taking? Authorizing Provider  acetaminophen (TYLENOL) 500 MG tablet Take 500 mg by mouth every 6 (six) hours as needed (pain).   Yes [provider]  benazepril (LOTENSIN) 40 MG tablet Take 1 tablet (40 mg total) by mouth daily. 11/04/18  Yes Crissman, Jeannette How, MD  cetirizine (ZYRTEC) 10 MG tablet Take 1 tablet (10 mg total) by mouth daily. 09/20/19  Yes [name], Jolene T, NP  ELIQUIS 5 MG TABS tablet Take 1 tablet (5 mg total) by mouth 2 (two) times daily. 08/23/19  Yes [name], Jolene T, NP  finasteride (PROSCAR) 5 MG tablet Take 1 tablet (5 mg total) by mouth daily. 04/24/19  Yes [name], Jolene T, NP  fluticasone (FLONASE) 50 MCG/ACT nasal spray Place 2 sprays into both nostrils 2 (two) times daily. 03/06/19  Yes Volney American, PA-C  metFORMIN (GLUCOPHAGE-XR) 500 MG 24 hr tablet Take 2 tablets (1,000 mg total) by mouth 2 (two) times daily. 06/26/18  Yes Crissman, Jeannette How, MD  metoprolol succinate (TOPROL-XL) 25 MG 24 hr tablet Take 1 tablet by mouth in the morning and at bedtime.  03/31/19  Yes [provider]  ondansetron (ZOFRAN ODT) 4 MG disintegrating tablet Take 1 tablet (4 mg total) by mouth every 8 (eight) hours as needed for nausea or vomiting. 06/25/19  Yes Merlyn Lot, MD  pantoprazole (PROTONIX) 40 MG tablet Take 40 mg by mouth at bedtime.    Yes [provider]  propafenone (RYTHMOL SR) 425 MG 12 hr capsule Take 425 mg by mouth 2 (two) times daily.   Yes [provider]  tamsulosin (FLOMAX) 0.4 MG CAPS capsule Take 0.8 mg by mouth daily. 07/18/19  Yes  [provider]  benzonatate (TESSALON) 100 MG capsule Take 1 capsule (100 mg total) by mouth every 8 (eight) hours. 10/01/19   Daielle Melcher, Myrene Galas, MD  ondansetron (ZOFRAN) 4 MG tablet Take 1 tablet (4 mg total) by mouth every 8 (eight) hours as needed for nausea or vomiting. 10/01/19   Fawn Desrocher, Myrene Galas, MD    Family History Family History  Problem Relation Age of Onset  . Brain cancer Mother   . Kidney disease Neg Hx   . Prostate cancer Neg Hx   . Kidney cancer Neg Hx   . Bladder Cancer Neg Hx     Social History Social History   Tobacco Use  . Smoking status: Never Smoker  . Smokeless tobacco: Never Used  Vaping Use  . Vaping Use: Never used  Substance Use Topics  . Alcohol use: No  . Drug use: No     Allergies   Levaquin [levofloxacin in  d5w], Shellfish allergy, Amiodarone, and Adhesive [tape]   Review of Systems Review of Systems  Constitutional: Positive for activity change and fatigue. Negative for chills and fever.  HENT: Positive for congestion, postnasal drip and sore throat.   Respiratory: Positive for cough.   Neurological: Positive for headaches.     Physical Exam Triage Vital Signs ED Triage Vitals  Enc Vitals Group     BP 10/01/19 1806 131/76     Pulse Rate 10/01/19 1806 77     Resp 10/01/19 1806 20     Temp 10/01/19 1806 98.9 F (37.2 C)     Temp Source 10/01/19 1806 Oral     SpO2 10/01/19 1806 97 %     Weight 10/01/19 1809 187 lb (84.8 kg)     Height 10/01/19 1809 6\' 1"  (1.854 m)     Head Circumference --      Peak Flow --      Pain Score 10/01/19 1808 0     Pain Loc --      Pain Edu? --      Excl. in [place]? --    No data found.  Updated Vital Signs BP 131/76 (BP Location: Left Arm)   Pulse 77   Temp 98.9 F (37.2 C) (Oral)   Resp 20   Ht 6\' 1"  (1.854 m)   Wt 84.8 kg   SpO2 97%   BMI 24.67 kg/m   Visual Acuity Right Eye Distance:   Left Eye Distance:   Bilateral Distance:    Right Eye Near:   Left Eye Near:     Bilateral Near:     Physical Exam Vitals and nursing note reviewed.  Constitutional:      General: He is not in acute distress.    Appearance: He is ill-appearing.  Cardiovascular:     Rate and Rhythm: Normal rate and regular rhythm.  Pulmonary:     Effort: Pulmonary effort is normal. No respiratory distress.     Breath sounds: Normal breath sounds. No wheezing.  Abdominal:     General: Bowel sounds are normal.     Palpations: Abdomen is soft.  Neurological:     Mental Status: He is alert.     GCS: GCS eye subscore is 4. GCS verbal subscore is 5. GCS motor subscore is 6.      UC Treatments / Results  Labs (all labs ordered are listed, but only abnormal results are displayed) Labs Reviewed  SARS CORONAVIRUS 2 (TAT 6-24 HRS)    EKG   Radiology No results found.  Procedures Procedures (including critical care time)  Medications Ordered in UC Medications - No data to display  Initial Impression / Assessment and Plan / UC Course  I have reviewed the triage vital signs and the nursing notes.  Pertinent labs & imaging results that were available during my care of the patient were reviewed by me and considered in my medical decision making (see chart for details).     1.  Cough with exposure to COVID-19 virus: Zofran as needed for nausea Tessalon Perles as needed for cough COVID-19 PCR test sent I commended patient for being vaccinated Push oral fluids Return precautions given. Final Clinical Impressions(s) / UC Diagnoses   Final diagnoses:  Cough with exposure to COVID-19 virus   Discharge Instructions   None    ED Prescriptions    Medication Sig Dispense Auth. Provider   ondansetron (ZOFRAN) 4 MG tablet Take 1 tablet (4 mg total) by mouth  every 8 (eight) hours as needed for nausea or vomiting. 20 tablet Binyamin Nelis, Myrene Galas, MD   benzonatate (TESSALON) 100 MG capsule Take 1 capsule (100 mg total) by mouth every 8 (eight) hours. 21 capsule Essance Gatti, Myrene Galas,  MD     PDMP not reviewed this encounter.   Chase Picket, MD 10/01/19 1902

## 2019-10-02 ENCOUNTER — Telehealth (INDEPENDENT_AMBULATORY_CARE_PROVIDER_SITE_OTHER): Payer: Medicare Other | Admitting: Nurse Practitioner

## 2019-10-02 ENCOUNTER — Ambulatory Visit (HOSPITAL_COMMUNITY)
Admission: RE | Admit: 2019-10-02 | Discharge: 2019-10-02 | Disposition: A | Discharge status: Home / Self Care | Payer: Medicare Other | Source: Ambulatory Visit | Attending: Pulmonary Disease | Admitting: Pulmonary Disease

## 2019-10-02 ENCOUNTER — Other Ambulatory Visit (HOSPITAL_COMMUNITY): Payer: Self-pay | Admitting: Nurse Practitioner

## 2019-10-02 ENCOUNTER — Encounter: Payer: Self-pay | Admitting: Nurse Practitioner

## 2019-10-02 DIAGNOSIS — U071 COVID-19: Secondary | ICD-10-CM | POA: Diagnosis not present

## 2019-10-02 DIAGNOSIS — Z23 Encounter for immunization: Secondary | ICD-10-CM | POA: Insufficient documentation

## 2019-10-02 DIAGNOSIS — Z8616 Personal history of COVID-19: Secondary | ICD-10-CM | POA: Insufficient documentation

## 2019-10-02 LAB — SARS CORONAVIRUS 2 (TAT 6-24 HRS): SARS Coronavirus 2: POSITIVE — AB

## 2019-10-02 MED ORDER — DIPHENHYDRAMINE HCL 50 MG/ML IJ SOLN: 50.0000 mg | Freq: Once | INTRAMUSCULAR | Status: DC | PRN

## 2019-10-02 MED ORDER — EPINEPHRINE 0.3 MG/0.3ML IJ SOAJ: 0.3000 mg | Freq: Once | INTRAMUSCULAR | Status: DC | PRN

## 2019-10-02 MED ORDER — SODIUM CHLORIDE 0.9 % IV SOLN
1200.0000 mg | Freq: Once | INTRAVENOUS | Status: AC
  Administered 2019-10-02: 1200 mg via INTRAVENOUS
  Filled 2019-10-02: qty 10

## 2019-10-02 MED ORDER — FAMOTIDINE IN NACL 20-0.9 MG/50ML-% IV SOLN: 20.0000 mg | Freq: Once | INTRAVENOUS | Status: DC | PRN

## 2019-10-02 MED ORDER — ALBUTEROL SULFATE HFA 108 (90 BASE) MCG/ACT IN AERS: 2.0000 | INHALATION_SPRAY | Freq: Once | RESPIRATORY_TRACT | Status: DC | PRN

## 2019-10-02 MED ORDER — METHYLPREDNISOLONE SODIUM SUCC 125 MG IJ SOLR: 125.0000 mg | Freq: Once | INTRAMUSCULAR | Status: DC | PRN

## 2019-10-02 MED ORDER — ALBUTEROL SULFATE HFA 108 (90 BASE) MCG/ACT IN AERS: 2.0000 | INHALATION_SPRAY | Freq: Four times a day (QID) | RESPIRATORY_TRACT | 0 refills | Status: DC | PRN

## 2019-10-02 MED ORDER — SODIUM CHLORIDE 0.9 % IV SOLN: INTRAVENOUS | Status: DC | PRN

## 2019-10-02 NOTE — Assessment & Plan Note (Signed)
Breakthrough case, last vaccine 03/17/19.  Symptomatic since 09/26/19.  Have recommend he start Vitamin D, Zinc, and Vitamin C at home.  Will send Albuterol inhaler to use as needed.  Continue OTC medications for symptoms relief + ensure adequate hydration and rest.  Was able to schedule for MAB infusion, which he will be attaining this afternoon, educated both his daughter and him on this infusion.  Recommend he obtain pulse oximeter and monitor closely at home, for consistent O2 sats in 80 range or worsening symptoms go immediately to ER.  Return in 2 weeks for virtual follow-up.

## 2019-10-02 NOTE — Patient Instructions (Signed)
COVID-19 COVID-19 is a respiratory infection that is caused by a virus called severe acute respiratory syndrome coronavirus 2 (SARS-CoV-2). The disease is also known as coronavirus disease or novel coronavirus. In some people, the virus may not cause any symptoms. In others, it may cause a serious infection. The infection can get worse quickly and can lead to complications, such as:  Pneumonia, or infection of the lungs.  Acute respiratory distress syndrome or ARDS. This is a condition in which fluid build-up in the lungs prevents the lungs from filling with air and passing oxygen into the blood.  Acute respiratory failure. This is a condition in which there is not enough oxygen passing from the lungs to the body or when carbon dioxide is not passing from the lungs out of the body.  Sepsis or septic shock. This is a serious bodily reaction to an infection.  Blood clotting problems.  Secondary infections due to bacteria or fungus.  Organ failure. This is when your body's organs stop working. The virus that causes COVID-19 is contagious. This means that it can spread from person to person through droplets from coughs and sneezes (respiratory secretions). What are the causes? This illness is caused by a virus. You may catch the virus by:  Breathing in droplets from an infected person. Droplets can be spread by a person breathing, speaking, singing, coughing, or sneezing.  Touching something, like a table or a doorknob, that was exposed to the virus (contaminated) and then touching your mouth, nose, or eyes. What increases the risk? Risk for infection You are more likely to be infected with this virus if you:  Are within 6 feet (2 meters) of a person with COVID-19.  Provide care for or live with a person who is infected with COVID-19.  Spend time in crowded indoor spaces or live in shared housing. Risk for serious illness You are more likely to become seriously ill from the virus if you:   Are 50 years of age or older. The higher your age, the more you are at risk for serious illness.  Live in a nursing home or long-term care facility.  Have cancer.  Have a long-term (chronic) disease such as: ? Chronic lung disease, including chronic obstructive pulmonary disease or asthma. ? A long-term disease that lowers your body's ability to fight infection (immunocompromised). ? Heart disease, including heart failure, a condition in which the arteries that lead to the heart become narrow or blocked (coronary artery disease), a disease which makes the heart muscle thick, weak, or stiff (cardiomyopathy). ? Diabetes. ? Chronic kidney disease. ? Sickle cell disease, a condition in which red blood cells have an abnormal "sickle" shape. ? Liver disease.  Are obese. What are the signs or symptoms? Symptoms of this condition can range from mild to severe. Symptoms may appear any time from 2 to 14 days after being exposed to the virus. They include:  A fever or chills.  A cough.  Difficulty breathing.  Headaches, body aches, or muscle aches.  Runny or stuffy (congested) nose.  A sore throat.  New loss of taste or smell. Some people may also have stomach problems, such as nausea, vomiting, or diarrhea. Other people may not have any symptoms of COVID-19. How is this diagnosed? This condition may be diagnosed based on:  Your signs and symptoms, especially if: ? You live in an area with a COVID-19 outbreak. ? You recently traveled to or from an area where the virus is common. ? You   provide care for or live with a person who was diagnosed with COVID-19. ? You were exposed to a person who was diagnosed with COVID-19.  A physical exam.  Lab tests, which may include: ? Taking a sample of fluid from the back of your nose and throat (nasopharyngeal fluid), your nose, or your throat using a swab. ? A sample of mucus from your lungs (sputum). ? Blood tests.  Imaging tests, which  may include, X-rays, CT scan, or ultrasound. How is this treated? At present, there is no medicine to treat COVID-19. Medicines that treat other diseases are being used on a trial basis to see if they are effective against COVID-19. Your health care provider will talk with you about ways to treat your symptoms. For most people, the infection is mild and can be managed at home with rest, fluids, and over-the-counter medicines. Treatment for a serious infection usually takes places in a hospital intensive care unit (ICU). It may include one or more of the following treatments. These treatments are given until your symptoms improve.  Receiving fluids and medicines through an IV.  Supplemental oxygen. Extra oxygen is given through a tube in the nose, a face mask, or a hood.  Positioning you to lie on your stomach (prone position). This makes it easier for oxygen to get into the lungs.  Continuous positive airway pressure (CPAP) or bi-level positive airway pressure (BPAP) machine. This treatment uses mild air pressure to keep the airways open. A tube that is connected to a motor delivers oxygen to the body.  Ventilator. This treatment moves air into and out of the lungs by using a tube that is placed in your windpipe.  Tracheostomy. This is a procedure to create a hole in the neck so that a breathing tube can be inserted.  Extracorporeal membrane oxygenation (ECMO). This procedure gives the lungs a chance to recover by taking over the functions of the heart and lungs. It supplies oxygen to the body and removes carbon dioxide. Follow these instructions at home: Lifestyle  If you are sick, stay home except to get medical care. Your health care provider will tell you how long to stay home. Call your health care provider before you go for medical care.  Rest at home as told by your health care provider.  Do not use any products that contain nicotine or tobacco, such as cigarettes, e-cigarettes, and  chewing tobacco. If you need help quitting, ask your health care provider.  Return to your normal activities as told by your health care provider. Ask your health care provider what activities are safe for you. General instructions  Take over-the-counter and prescription medicines only as told by your health care provider.  Drink enough fluid to keep your urine pale yellow.  Keep all follow-up visits as told by your health care provider. This is important. How is this prevented?  There is no vaccine to help prevent COVID-19 infection. However, there are steps you can take to protect yourself and others from this virus. To protect yourself:   Do not travel to areas where COVID-19 is a risk. The areas where COVID-19 is reported change often. To identify high-risk areas and travel restrictions, check the CDC travel website: wwwnc.cdc.gov/travel/notices  If you live in, or must travel to, an area where COVID-19 is a risk, take precautions to avoid infection. ? Stay away from people who are sick. ? Wash your hands often with soap and water for 20 seconds. If soap and water   are not available, use an alcohol-based hand sanitizer. ? Avoid touching your mouth, face, eyes, or nose. ? Avoid going out in public, follow guidance from your state and local health authorities. ? If you must go out in public, wear a cloth face covering or face mask. Make sure your mask covers your nose and mouth. ? Avoid crowded indoor spaces. Stay at least 6 feet (2 meters) away from others. ? Disinfect objects and surfaces that are frequently touched every day. This may include:  Counters and tables.  Doorknobs and light switches.  Sinks and faucets.  Electronics, such as phones, remote controls, keyboards, computers, and tablets. To protect others: If you have symptoms of COVID-19, take steps to prevent the virus from spreading to others.  If you think you have a COVID-19 infection, contact your health care  provider right away. Tell your health care team that you think you may have a COVID-19 infection.  Stay home. Leave your house only to seek medical care. Do not use public transport.  Do not travel while you are sick.  Wash your hands often with soap and water for 20 seconds. If soap and water are not available, use alcohol-based hand sanitizer.  Stay away from other members of your household. Let healthy household members care for children and pets, if possible. If you have to care for children or pets, wash your hands often and wear a mask. If possible, stay in your own room, separate from others. Use a different bathroom.  Make sure that all people in your household wash their hands well and often.  Cough or sneeze into a tissue or your sleeve or elbow. Do not cough or sneeze into your hand or into the air.  Wear a cloth face covering or face mask. Make sure your mask covers your nose and mouth. Where to find more information  Centers for Disease Control and Prevention: www.cdc.gov/coronavirus/2019-ncov/index.html  World Health Organization: www.who.int/health-topics/coronavirus Contact a health care provider if:  You live in or have traveled to an area where COVID-19 is a risk and you have symptoms of the infection.  You have had contact with someone who has COVID-19 and you have symptoms of the infection. Get help right away if:  You have trouble breathing.  You have pain or pressure in your chest.  You have confusion.  You have bluish lips and fingernails.  You have difficulty waking from sleep.  You have symptoms that get worse. These symptoms may represent a serious problem that is an emergency. Do not wait to see if the symptoms will go away. Get medical help right away. Call your local emergency services (911 in the [place] Do not drive yourself to the hospital. Let the emergency medical personnel know if you think you have COVID-19. Summary  COVID-19 is a  respiratory infection that is caused by a virus. It is also known as coronavirus disease or novel coronavirus. It can cause serious infections, such as pneumonia, acute respiratory distress syndrome, acute respiratory failure, or sepsis.  The virus that causes COVID-19 is contagious. This means that it can spread from person to person through droplets from breathing, speaking, singing, coughing, or sneezing.  You are more likely to develop a serious illness if you are 50 years of age or older, have a weak immune system, live in a nursing home, or have chronic disease.  There is no medicine to treat COVID-19. Your health care provider will talk with you about ways to treat your symptoms.    Take steps to protect yourself and others from infection. Wash your hands often and disinfect objects and surfaces that are frequently touched every day. Stay away from people who are sick and wear a mask if you are sick. This information is not intended to replace advice given to you by your health care provider. Make sure you discuss any questions you have with your health care provider. Document Revised: 11/29/2018 Document Reviewed: 03/07/2018 Elsevier Patient Education  2020 [employer]  

## 2019-10-02 NOTE — Progress Notes (Signed)
There were no vitals taken for this visit.   Subjective:    Patient ID: Sean Hacker., male    DOB: 1940/08/08, 79 y.o.   MRN: [id number]  HPI: Sean Schurman. is a 79 y.o. male  Chief Complaint  Patient presents with  . Covid Exposure    pt states he has been diagnosed with COVID- states he has no energy at all, cough, sore throat, and diarrhea    . This visit was completed via MyChart due to the restrictions of the COVID-19 pandemic. All issues as above were discussed and addressed. Physical exam was done as above through visual confirmation on MyChart. If it was felt that the patient should be evaluated in the office, they were directed there. The patient verbally consented to this visit. . Location of the patient: home . Location of the provider: work . Those involved with this call:  . Provider: Marnee Guarneri, DNP . CMA: Yvonna Alanis, CMA . Front Desk/Registration: Don Perking  . Time spent on call: 20 minutes with patient face to face via video conference. More than 50% of this time was spent in counseling and coordination of care. 15 minutes total spent in review of patient's record and preparation of their chart.  . I verified patient identity using two factors (patient name and date of birth). Patient consents verbally to being seen via telemedicine visit today.    COVID POSITIVE: Tested positive for Covid 10/01/19.  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  Is interested in MAB infusion, have alerted infusion team.  Is vaccinated, last vaccine 03/17/2019. Fever: no Cough: yes Shortness of breath: occasional Wheezing: no Chest pain: no Chest tightness: yes Chest congestion: no Nasal congestion: yes Runny nose: yes Post nasal drip: yes Sneezing: no Sore throat: yes Swollen glands: no Sinus pressure: no Headache: no Face pain: no Toothache: no Ear pain:  none Ear pressure: none Eyes red/itching:no Eye drainage/crusting: no  Vomiting: no Rash: no Fatigue: yes Sick contacts: yes Strep contacts: no  Context: stable Recurrent sinusitis: no Relief with OTC cold/cough medications: no  Treatments attempted: cold/sinus   Relevant past medical, surgical, family and social history reviewed and updated as indicated. Interim medical history since our last visit reviewed.  Allergies and medications reviewed and updated.  Review of Systems  Constitutional: Positive for fatigue. Negative for activity change, diaphoresis and fever.  HENT: Positive for congestion, postnasal drip, rhinorrhea, sinus pressure and sore throat. Negative for ear discharge, ear pain, sinus pain and trouble swallowing.   Respiratory: Positive for cough and shortness of breath (occasional). Negative for chest tightness and wheezing.   Cardiovascular: Negative for chest pain, palpitations and leg swelling.  Gastrointestinal: Positive for diarrhea. Negative for abdominal distention, abdominal pain, constipation, nausea and vomiting.  Musculoskeletal: Negative for myalgias.  Neurological: Negative.   Psychiatric/Behavioral: Negative.     Per HPI unless specifically indicated above     Objective:    There were no vitals taken for this visit.  Wt Readings from Last 3 Encounters:  10/01/19 187 lb (84.8 kg)  09/08/19 188 lb (85.3 kg)  08/20/19 187 lb 12.8 oz (85.2 kg)    Physical Exam   Video not working, unable to perform physical exam.  Able to hear audio via [place].  Results for orders placed or performed during the hospital encounter of 10/01/19  SARS CORONAVIRUS 2 (TAT 6-24 HRS)  Nasopharyngeal Nasopharyngeal Swab   Specimen: Nasopharyngeal Swab  Result Value Ref Range   SARS Coronavirus 2 POSITIVE (A) NEGATIVE      Assessment & Plan:   Problem List Items Addressed This Visit      Other   Lab test positive for detection of COVID-19 virus    Breakthrough  case, last vaccine 03/17/19.  Symptomatic since 09/26/19.  Have recommend he start Vitamin D, Zinc, and Vitamin C at home.  Will send Albuterol inhaler to use as needed.  Continue OTC medications for symptoms relief + ensure adequate hydration and rest.  Was able to schedule for MAB infusion, which he will be attaining this afternoon, educated both his daughter and him on this infusion.  Recommend he obtain pulse oximeter and monitor closely at home, for consistent O2 sats in 80 range or worsening symptoms go immediately to ER.  Return in 2 weeks for virtual follow-up.          Follow up plan: Return in about 2 weeks (around 10/16/2019) for Virtual Covid follow-up.

## 2019-10-02 NOTE — Progress Notes (Signed)
  Diagnosis: COVID-19  Physician:Dr wright  Procedure: Covid Infusion Clinic Med: casirivimab\imdevimab infusion - Provided patient with casirivimab\imdevimab fact sheet for patients, parents and caregivers prior to infusion.  Complications: No immediate complications noted.  Discharge: Discharged home   [place], [employer] 10/02/2019

## 2019-10-02 NOTE — Discharge Instructions (Signed)

## 2019-10-02 NOTE — Progress Notes (Signed)
I connected by phone with Sean Reyes. on 10/02/2019 at 11:24 AM to discuss the potential use of an new treatment for mild to moderate COVID-19 viral infection in non-hospitalized patients.  This patient is a 79 y.o. male that meets the FDA criteria for Emergency Use Authorization of casirivimab\imdevimab.  Has a (+) direct SARS-CoV-2 viral test result  Has mild or moderate COVID-19   Is ? 77 years of age and weighs ? 40 kg  Is NOT hospitalized due to COVID-19  Is NOT requiring oxygen therapy or requiring an increase in baseline oxygen flow rate due to COVID-19  Is within 10 days of symptom onset  Has at least one of the high risk factor(s) for progression to severe COVID-19 and/or hospitalization as defined in EUA.  Specific high risk criteria : Older age (>/= 77 yo), DM, hypertension   I have spoken and communicated the following to the patient or parent/caregiver:  1. FDA has authorized the emergency use of casirivimab\imdevimab for the treatment of mild to moderate COVID-19 in adults and pediatric patients with positive results of direct SARS-CoV-2 viral testing who are 36 years of age and older weighing at least 40 kg, and who are at high risk for progressing to severe COVID-19 and/or hospitalization.  2. The significant known and potential risks and benefits of casirivimab\imdevimab, and the extent to which such potential risks and benefits are unknown.  3. Information on available alternative treatments and the risks and benefits of those alternatives, including clinical trials.  4. Patients treated with casirivimab\imdevimab should continue to self-isolate and use infection control measures (e.g., wear mask, isolate, social distance, avoid sharing personal items, clean and disinfect "high touch" surfaces, and frequent handwashing) according to CDC guidelines.   5. The patient or parent/caregiver has the option to accept or refuse casirivimab\imdevimab .  After reviewing  this information with the patient, The patient agreed to proceed with receiving casirivimab\imdevimab infusion and will be provided a copy of the Fact sheet prior to receiving the infusion.Beckey Rutter, [place], AGNP-C [phone] ([place])

## 2019-10-03 ENCOUNTER — Telehealth: Payer: Self-pay | Admitting: Nurse Practitioner

## 2019-10-03 NOTE — Telephone Encounter (Signed)
-----   Message from [name], NP sent at 10/03/2019  1:09 PM EDT ----- Needs 2 week follow-up

## 2019-10-03 NOTE — Telephone Encounter (Signed)
LVM TO MAKE THIS APT  

## 2019-10-06 NOTE — Telephone Encounter (Signed)
Virtual- patient is COVID positive

## 2019-10-06 NOTE — Telephone Encounter (Signed)
Pt has apt on 10/22/2019 , did this need to be virtual or in office?

## 2019-10-09 ENCOUNTER — Telehealth (INDEPENDENT_AMBULATORY_CARE_PROVIDER_SITE_OTHER): Payer: Medicare Other | Admitting: Unknown Physician Specialty

## 2019-10-09 ENCOUNTER — Encounter: Payer: Self-pay | Admitting: Unknown Physician Specialty

## 2019-10-09 VITALS — BP 147/85 | HR 62

## 2019-10-09 DIAGNOSIS — R05 Cough: Secondary | ICD-10-CM

## 2019-10-09 DIAGNOSIS — R059 Cough, unspecified: Secondary | ICD-10-CM

## 2019-10-09 MED ORDER — ALBUTEROL SULFATE HFA 108 (90 BASE) MCG/ACT IN AERS: 2.0000 | INHALATION_SPRAY | Freq: Four times a day (QID) | RESPIRATORY_TRACT | 2 refills | Status: DC | PRN

## 2019-10-09 MED ORDER — PREDNISONE 20 MG PO TABS: 40.0000 mg | ORAL_TABLET | Freq: Every day | ORAL | 0 refills | Status: DC

## 2019-10-09 MED ORDER — AZITHROMYCIN 250 MG PO TABS: ORAL_TABLET | ORAL | 0 refills | Status: DC

## 2019-10-09 NOTE — Progress Notes (Signed)
BP (!) 147/85   Pulse 62   SpO2 98%    Subjective:    Patient ID: Sean Reyes., male    DOB: 02-14-1940, 79 y.o.   MRN: [id number]  HPI: Author Hatlestad. is a 79 y.o. male  Chief Complaint  Patient presents with  . Cough    pt states he is having a cough since being diagnosed with COVID   This visit was completed via telephone due to the restrictions of the COVID-19 pandemic. All issues as above were discussed and addressed but no physical exam was performed. If it was felt that the patient should be evaluated in the office, they were directed there. The patient verbally consented to this visit. Patient was unable to complete an audio/visual visit due to Lack of equipment. . Location of the patient: home . Location of the provider: work . Those involved with this call:  . Provider: Kathrine Haddock, DNP . CMA: Lesle Chris, [place] . Front Desk/Registration: Jill Side  . Time spent on call: 10 minutes on the phone discussing health concerns. 10 minutes total spent in review of patient's record and preparation of their chart.  I verified patient identity using two factors (patient name and date of birth). Patient consents verbally to being seen via telemedicine visit today.   Cough Cough for 2 days.  States had a bad coughing spell.  Yesterday and last night cough got worse.  States diagnosed with coved 8/19.  Had MAB infusion.    Cough This is a new problem. The current episode started in the past 7 days. The problem has been waxing and waning (recently worse). The cough is productive of purulent sputum. Associated symptoms include nasal congestion, shortness of breath and wheezing. Pertinent negatives include no chest pain, chills, ear congestion, fever, headaches, hemoptysis, rash or sweats. The symptoms are aggravated by lying down. He has tried body position changes for the symptoms. The treatment provided mild relief.   Social History   Socioeconomic History  . Marital  status: Married    Spouse name: Malachy Mood   . Number of children: 2  . Years of education: Not on file  . Highest education level: High school graduate  Occupational History  . Occupation: retired   Tobacco Use  . Smoking status: Never Smoker  . Smokeless tobacco: Never Used  Vaping Use  . Vaping Use: Never used  Substance and Sexual Activity  . Alcohol use: No  . Drug use: No  . Sexual activity: Not on file  Other Topics Concern  . Not on file  Social History Narrative   Married   Gets regular exercise   Social Determinants of Health   Financial Resource Strain: Low Risk   . Difficulty of Paying Living Expenses: Not hard at all  Food Insecurity: No Food Insecurity  . Worried About Charity fundraiser in the Last Year: Never true  . Ran Out of Food in the Last Year: Never true  Transportation Needs: No Transportation Needs  . Lack of Transportation (Medical): No  . Lack of Transportation (Non-Medical): No  Physical Activity: Inactive  . Days of Exercise per Week: 0 days  . Minutes of Exercise per Session: 0 min  Stress: No Stress Concern Present  . Feeling of Stress : Not at all  Social Connections: Moderately Integrated  . Frequency of Communication with Friends and Family: More than three times a week  . Frequency of Social Gatherings with Friends and Family: More than  three times a week  . Attends Religious Services: More than 4 times per year  . Active Member of Clubs or Organizations: No  . Attends Archivist Meetings: Never  . Marital Status: Married  Human resources officer Violence: Not At Risk  . Fear of Current or Ex-Partner: No  . Emotionally Abused: No  . Physically Abused: No  . Sexually Abused: No      Relevant past medical, surgical, family and social history reviewed and updated as indicated. Interim medical history since our last visit reviewed. Allergies and medications reviewed and updated.  Review of Systems  Constitutional: Negative for  chills and fever.  Respiratory: Positive for cough, shortness of breath and wheezing. Negative for hemoptysis.   Cardiovascular: Negative for chest pain.  Skin: Negative for rash.  Neurological: Negative for headaches.    Per HPI unless specifically indicated above     Objective:    BP (!) 147/85   Pulse 62   SpO2 98%   Wt Readings from Last 3 Encounters:  10/01/19 187 lb (84.8 kg)  09/08/19 188 lb (85.3 kg)  08/20/19 187 lb 12.8 oz (85.2 kg)    Physical Exam Neurological:     Mental Status: He is alert and oriented to person, place, and time.     Results for orders placed or performed during the hospital encounter of 10/01/19  SARS CORONAVIRUS 2 (TAT 6-24 HRS) Nasopharyngeal Nasopharyngeal Swab   Specimen: Nasopharyngeal Swab  Result Value Ref Range   SARS Coronavirus 2 POSITIVE (A) NEGATIVE      Assessment & Plan:   Problem List Items Addressed This Visit    None    Visit Diagnoses    Cough    -  Primary   Rx for Z pack, prednisone 40 mg/day for 5 days, and albuterol inhaler and asked to get instructions on use. Can take q 4 hrs.        Continue elevation of bed prn  Follow up plan: Return if symptoms worsen or fail to improve.

## 2019-10-13 ENCOUNTER — Other Ambulatory Visit: Payer: Self-pay | Admitting: Unknown Physician Specialty

## 2019-10-13 DIAGNOSIS — E119 Type 2 diabetes mellitus without complications: Secondary | ICD-10-CM

## 2019-10-13 NOTE — Telephone Encounter (Signed)
Requested medication (s) are due for refill today: Yes  Requested medication (s) are on the active medication list: Yes  Last refill:  06/26/18  Future visit scheduled: Yes  Notes to clinic:  Unable to refill, expired Rx     Requested Prescriptions  Pending Prescriptions Disp Refills   metFORMIN (GLUCOPHAGE-XR) 500 MG 24 hr tablet [Pharmacy Med Name: METFORMIN HCL ER 500 MG TABLET] 360 tablet 0    Sig: Take 2 tablets (1,000 mg total) by mouth 2 (two) times daily.      Endocrinology:  Diabetes - Biguanides Passed - 10/13/2019  2:44 PM      Passed - Cr in normal range and within 360 days    Creatinine  Date Value Ref Range Status  07/01/2012 1.27 0.60 - 1.30 mg/dL Final   Creatinine, Ser  Date Value Ref Range Status  08/20/2019 1.26 0.76 - 1.27 mg/dL Final          Passed - HBA1C is between 0 and 7.9 and within 180 days    HB A1C (BAYER DCA - WAIVED)  Date Value Ref Range Status  08/20/2019 6.3 <7.0 % Final    Comment:                                          Diabetic Adult            <7.0                                       Healthy Adult        4.3 - 5.7                                                           (DCCT/NGSP) American Diabetes Association's Summary of Glycemic Recommendations for Adults with Diabetes: Hemoglobin A1c <7.0%. More stringent glycemic goals (A1c <6.0%) may further reduce complications at the cost of increased risk of hypoglycemia.           Passed - eGFR in normal range and within 360 days    EGFR (African American)  Date Value Ref Range Status  07/01/2012 >60  Final   GFR calc Af Amer  Date Value Ref Range Status  08/20/2019 63 >59 mL/min/1.73 Final    Comment:    **[facility] currently reports eGFR in compliance with the current**   recommendations of the [employer]. [facility] will   update reporting as new guidelines are published from the NKF-ASN   Task force.    EGFR (Non-African Amer.)  Date Value Ref Range  Status  07/01/2012 57 (L)  Final    Comment:    eGFR values <50m/min/1.73 m2 may be an indication of chronic kidney disease (CKD). Calculated eGFR is useful in patients with stable renal function. The eGFR calculation will not be reliable in acutely ill patients when serum creatinine is changing rapidly. It is not useful in  patients on dialysis. The eGFR calculation may not be applicable to patients at the low and high extremes of body sizes, pregnant women, and vegetarians.    GFR calc non Af AWyvonnia Lora  Date Value Ref Range Status  08/20/2019 55 (L) >59 mL/min/1.73 Final          Passed - Valid encounter within last 6 months    Recent Outpatient Visits           4 days ago Cough   [facility] Kathrine Haddock, NP   1 week ago Lab test positive for detection of COVID-19 virus   [facility] [place], Theodore T, NP   1 month ago Type 2 diabetes mellitus with stage 3a chronic kidney disease, without long-term current use of insulin ([place])   [place], Merrifield T, NP   2 months ago Fatigue, unspecified type   [place], Jolene T, NP   5 months ago Type 2 diabetes mellitus with stage 3a chronic kidney disease, without long-term current use of insulin ([place])   [place], [name], NP       Future Appointments             In 1 week [name], [name], NP [facility]   In 9 months  [facility]

## 2019-10-21 ENCOUNTER — Other Ambulatory Visit: Payer: Self-pay | Admitting: Student

## 2019-10-21 DIAGNOSIS — R1310 Dysphagia, unspecified: Secondary | ICD-10-CM

## 2019-10-21 DIAGNOSIS — K224 Dyskinesia of esophagus: Secondary | ICD-10-CM

## 2019-10-21 DIAGNOSIS — R1312 Dysphagia, oropharyngeal phase: Secondary | ICD-10-CM

## 2019-10-22 ENCOUNTER — Encounter: Payer: Self-pay | Admitting: Nurse Practitioner

## 2019-10-22 ENCOUNTER — Telehealth (INDEPENDENT_AMBULATORY_CARE_PROVIDER_SITE_OTHER): Payer: Medicare Other | Admitting: Nurse Practitioner

## 2019-10-22 ENCOUNTER — Telehealth: Payer: Self-pay | Admitting: Nurse Practitioner

## 2019-10-22 DIAGNOSIS — U071 COVID-19: Secondary | ICD-10-CM | POA: Diagnosis not present

## 2019-10-22 NOTE — Telephone Encounter (Signed)
LVM to make this apt  

## 2019-10-22 NOTE — Progress Notes (Signed)
BP (!) 154/87   Pulse (!) 58   Wt 190 lb (86.2 kg)   SpO2 99%   BMI 25.07 kg/m    Subjective:    Patient ID: Sean Reyes., male    DOB: 1940-11-09, 79 y.o.   MRN: [id number]  HPI: Sean Reyes. is a 79 y.o. male  Chief Complaint  Patient presents with  . Covid Exposure  . Follow-up    . This visit was completed via MyChart due to the restrictions of the COVID-19 pandemic. All issues as above were discussed and addressed. Physical exam was done as above through visual confirmation on MyChart. If it was felt that the patient should be evaluated in the office, they were directed there. The patient verbally consented to this visit. . Location of the patient: home . Location of the provider: work . Those involved with this call:  . Provider: Marnee Guarneri, DNP . CMA: Yvonna Alanis, CMA . Front Desk/Registration: Don Perking  . Time spent on call: 20 minutes with patient face to face via video conference. More than 50% of this time was spent in counseling and coordination of care. 15 minutes total spent in review of patient's record and preparation of their chart.  . I verified patient identity using two factors (patient name and date of birth). Patient consents verbally to being seen via telemedicine visit today.    COVID FOLLOW-UP Was diagnosed on 10/01/19 and had MAB infusion on 10/02/19.  He reports ongoing fatigue and mild cough, is using Albuterol as needed which helps with cough.  Overall he reports improvement in symptoms. Fever: no Cough: yes -- improving Shortness of breath: no Wheezing: no Chest pain: no Chest tightness: no Chest congestion: occasional Nasal congestion: no Runny nose: no Post nasal drip: yes Sneezing: no Sore throat: no Swollen glands: no Sinus pressure: no Headache: no Face pain: no Toothache: no Ear pain: none Ear pressure: none Eyes red/itching:no Eye drainage/crusting: no  Vomiting: no Rash: no Fatigue: yes Sick  contacts: yes Strep contacts: no  Context: better Recurrent sinusitis: no Relief with OTC cold/cough medications: yes  Treatments attempted: mucinex   Relevant past medical, surgical, family and social history reviewed and updated as indicated. Interim medical history since our last visit reviewed. Allergies and medications reviewed and updated.  Review of Systems  Constitutional: Positive for fatigue. Negative for activity change, diaphoresis and fever.  HENT: Negative for congestion, ear discharge, ear pain, postnasal drip, rhinorrhea, sinus pressure, sinus pain, sore throat and trouble swallowing.   Respiratory: Positive for cough. Negative for chest tightness, shortness of breath and wheezing.   Cardiovascular: Negative for chest pain, palpitations and leg swelling.  Gastrointestinal: Negative for abdominal distention, abdominal pain, constipation, diarrhea, nausea and vomiting.  Musculoskeletal: Negative for myalgias.  Neurological: Negative.   Psychiatric/Behavioral: Negative.     Per HPI unless specifically indicated above     Objective:    BP (!) 154/87   Pulse (!) 58   Wt 190 lb (86.2 kg)   SpO2 99%   BMI 25.07 kg/m   Wt Readings from Last 3 Encounters:  10/22/19 190 lb (86.2 kg)  10/01/19 187 lb (84.8 kg)  09/08/19 188 lb (85.3 kg)    Physical Exam   Unable to perform as patient camera not working, but audio working via [employer].  Results for orders placed or performed during the hospital encounter of 10/01/19  SARS CORONAVIRUS 2 (TAT 6-24 HRS) Nasopharyngeal Nasopharyngeal Swab   Specimen: Nasopharyngeal Swab  Result Value Ref Range   SARS Coronavirus 2 POSITIVE (A) NEGATIVE      Assessment & Plan:   Problem List Items Addressed This Visit      Other   Lab test positive for detection of COVID-19 virus    Improving, had MAB infusion.  Breakthrough case, last vaccine 03/17/19.  Symptomatic since 09/26/19.  Have recommend he continue Vitamin D, Zinc, and  Vitamin C at home.  Continue Albuterol inhaler to use as needed.  Continue OTC medications for symptoms relief + ensure adequate hydration and rest. Return in October for follow-up chronic disease and fatigue.          Follow up plan: Return for in October for T2DM, HTN/HLD.

## 2019-10-22 NOTE — Telephone Encounter (Signed)
-----   Message from [name], NP sent at 10/22/2019 11:33 AM EDT ----- Please ensure follow-up in October

## 2019-10-22 NOTE — Patient Instructions (Signed)
COVID-19 COVID-19 is a respiratory infection that is caused by a virus called severe acute respiratory syndrome coronavirus 2 (SARS-CoV-2). The disease is also known as coronavirus disease or novel coronavirus. In some people, the virus may not cause any symptoms. In others, it may cause a serious infection. The infection can get worse quickly and can lead to complications, such as:  Pneumonia, or infection of the lungs.  Acute respiratory distress syndrome or ARDS. This is a condition in which fluid build-up in the lungs prevents the lungs from filling with air and passing oxygen into the blood.  Acute respiratory failure. This is a condition in which there is not enough oxygen passing from the lungs to the body or when carbon dioxide is not passing from the lungs out of the body.  Sepsis or septic shock. This is a serious bodily reaction to an infection.  Blood clotting problems.  Secondary infections due to bacteria or fungus.  Organ failure. This is when your body's organs stop working. The virus that causes COVID-19 is contagious. This means that it can spread from person to person through droplets from coughs and sneezes (respiratory secretions). What are the causes? This illness is caused by a virus. You may catch the virus by:  Breathing in droplets from an infected person. Droplets can be spread by a person breathing, speaking, singing, coughing, or sneezing.  Touching something, like a table or a doorknob, that was exposed to the virus (contaminated) and then touching your mouth, nose, or eyes. What increases the risk? Risk for infection You are more likely to be infected with this virus if you:  Are within 6 feet (2 meters) of a person with COVID-19.  Provide care for or live with a person who is infected with COVID-19.  Spend time in crowded indoor spaces or live in shared housing. Risk for serious illness You are more likely to become seriously ill from the virus if you:   Are 50 years of age or older. The higher your age, the more you are at risk for serious illness.  Live in a nursing home or long-term care facility.  Have cancer.  Have a long-term (chronic) disease such as: ? Chronic lung disease, including chronic obstructive pulmonary disease or asthma. ? A long-term disease that lowers your body's ability to fight infection (immunocompromised). ? Heart disease, including heart failure, a condition in which the arteries that lead to the heart become narrow or blocked (coronary artery disease), a disease which makes the heart muscle thick, weak, or stiff (cardiomyopathy). ? Diabetes. ? Chronic kidney disease. ? Sickle cell disease, a condition in which red blood cells have an abnormal "sickle" shape. ? Liver disease.  Are obese. What are the signs or symptoms? Symptoms of this condition can range from mild to severe. Symptoms may appear any time from 2 to 14 days after being exposed to the virus. They include:  A fever or chills.  A cough.  Difficulty breathing.  Headaches, body aches, or muscle aches.  Runny or stuffy (congested) nose.  A sore throat.  New loss of taste or smell. Some people may also have stomach problems, such as nausea, vomiting, or diarrhea. Other people may not have any symptoms of COVID-19. How is this diagnosed? This condition may be diagnosed based on:  Your signs and symptoms, especially if: ? You live in an area with a COVID-19 outbreak. ? You recently traveled to or from an area where the virus is common. ? You   provide care for or live with a person who was diagnosed with COVID-19. ? You were exposed to a person who was diagnosed with COVID-19.  A physical exam.  Lab tests, which may include: ? Taking a sample of fluid from the back of your nose and throat (nasopharyngeal fluid), your nose, or your throat using a swab. ? A sample of mucus from your lungs (sputum). ? Blood tests.  Imaging tests, which  may include, X-rays, CT scan, or ultrasound. How is this treated? At present, there is no medicine to treat COVID-19. Medicines that treat other diseases are being used on a trial basis to see if they are effective against COVID-19. Your health care provider will talk with you about ways to treat your symptoms. For most people, the infection is mild and can be managed at home with rest, fluids, and over-the-counter medicines. Treatment for a serious infection usually takes places in a hospital intensive care unit (ICU). It may include one or more of the following treatments. These treatments are given until your symptoms improve.  Receiving fluids and medicines through an IV.  Supplemental oxygen. Extra oxygen is given through a tube in the nose, a face mask, or a hood.  Positioning you to lie on your stomach (prone position). This makes it easier for oxygen to get into the lungs.  Continuous positive airway pressure (CPAP) or bi-level positive airway pressure (BPAP) machine. This treatment uses mild air pressure to keep the airways open. A tube that is connected to a motor delivers oxygen to the body.  Ventilator. This treatment moves air into and out of the lungs by using a tube that is placed in your windpipe.  Tracheostomy. This is a procedure to create a hole in the neck so that a breathing tube can be inserted.  Extracorporeal membrane oxygenation (ECMO). This procedure gives the lungs a chance to recover by taking over the functions of the heart and lungs. It supplies oxygen to the body and removes carbon dioxide. Follow these instructions at home: Lifestyle  If you are sick, stay home except to get medical care. Your health care provider will tell you how long to stay home. Call your health care provider before you go for medical care.  Rest at home as told by your health care provider.  Do not use any products that contain nicotine or tobacco, such as cigarettes, e-cigarettes, and  chewing tobacco. If you need help quitting, ask your health care provider.  Return to your normal activities as told by your health care provider. Ask your health care provider what activities are safe for you. General instructions  Take over-the-counter and prescription medicines only as told by your health care provider.  Drink enough fluid to keep your urine pale yellow.  Keep all follow-up visits as told by your health care provider. This is important. How is this prevented?  There is no vaccine to help prevent COVID-19 infection. However, there are steps you can take to protect yourself and others from this virus. To protect yourself:   Do not travel to areas where COVID-19 is a risk. The areas where COVID-19 is reported change often. To identify high-risk areas and travel restrictions, check the CDC travel website: wwwnc.cdc.gov/travel/notices  If you live in, or must travel to, an area where COVID-19 is a risk, take precautions to avoid infection. ? Stay away from people who are sick. ? Wash your hands often with soap and water for 20 seconds. If soap and water   are not available, use an alcohol-based hand sanitizer. ? Avoid touching your mouth, face, eyes, or nose. ? Avoid going out in public, follow guidance from your state and local health authorities. ? If you must go out in public, wear a cloth face covering or face mask. Make sure your mask covers your nose and mouth. ? Avoid crowded indoor spaces. Stay at least 6 feet (2 meters) away from others. ? Disinfect objects and surfaces that are frequently touched every day. This may include:  Counters and tables.  Doorknobs and light switches.  Sinks and faucets.  Electronics, such as phones, remote controls, keyboards, computers, and tablets. To protect others: If you have symptoms of COVID-19, take steps to prevent the virus from spreading to others.  If you think you have a COVID-19 infection, contact your health care  provider right away. Tell your health care team that you think you may have a COVID-19 infection.  Stay home. Leave your house only to seek medical care. Do not use public transport.  Do not travel while you are sick.  Wash your hands often with soap and water for 20 seconds. If soap and water are not available, use alcohol-based hand sanitizer.  Stay away from other members of your household. Let healthy household members care for children and pets, if possible. If you have to care for children or pets, wash your hands often and wear a mask. If possible, stay in your own room, separate from others. Use a different bathroom.  Make sure that all people in your household wash their hands well and often.  Cough or sneeze into a tissue or your sleeve or elbow. Do not cough or sneeze into your hand or into the air.  Wear a cloth face covering or face mask. Make sure your mask covers your nose and mouth. Where to find more information  Centers for Disease Control and Prevention: www.cdc.gov/coronavirus/2019-ncov/index.html  World Health Organization: www.who.int/health-topics/coronavirus Contact a health care provider if:  You live in or have traveled to an area where COVID-19 is a risk and you have symptoms of the infection.  You have had contact with someone who has COVID-19 and you have symptoms of the infection. Get help right away if:  You have trouble breathing.  You have pain or pressure in your chest.  You have confusion.  You have bluish lips and fingernails.  You have difficulty waking from sleep.  You have symptoms that get worse. These symptoms may represent a serious problem that is an emergency. Do not wait to see if the symptoms will go away. Get medical help right away. Call your local emergency services (911 in the [place] Do not drive yourself to the hospital. Let the emergency medical personnel know if you think you have COVID-19. Summary  COVID-19 is a  respiratory infection that is caused by a virus. It is also known as coronavirus disease or novel coronavirus. It can cause serious infections, such as pneumonia, acute respiratory distress syndrome, acute respiratory failure, or sepsis.  The virus that causes COVID-19 is contagious. This means that it can spread from person to person through droplets from breathing, speaking, singing, coughing, or sneezing.  You are more likely to develop a serious illness if you are 50 years of age or older, have a weak immune system, live in a nursing home, or have chronic disease.  There is no medicine to treat COVID-19. Your health care provider will talk with you about ways to treat your symptoms.    Take steps to protect yourself and others from infection. Wash your hands often and disinfect objects and surfaces that are frequently touched every day. Stay away from people who are sick and wear a mask if you are sick. This information is not intended to replace advice given to you by your health care provider. Make sure you discuss any questions you have with your health care provider. Document Revised: 11/29/2018 Document Reviewed: 03/07/2018 Elsevier Patient Education  2020 [employer]  

## 2019-10-22 NOTE — Assessment & Plan Note (Signed)
Improving, had MAB infusion.  Breakthrough case, last vaccine 03/17/19.  Symptomatic since 09/26/19.  Have recommend he continue Vitamin D, Zinc, and Vitamin C at home.  Continue Albuterol inhaler to use as needed.  Continue OTC medications for symptoms relief + ensure adequate hydration and rest. Return in October for follow-up chronic disease and fatigue.

## 2019-10-22 NOTE — Telephone Encounter (Signed)
Pt has been scheduled.  °

## 2019-11-05 ENCOUNTER — Ambulatory Visit
Admission: RE | Admit: 2019-11-05 | Discharge: 2019-11-05 | Disposition: A | Discharge status: Home / Self Care | Payer: Medicare Other | Source: Ambulatory Visit | Attending: Student | Admitting: Student

## 2019-11-05 ENCOUNTER — Other Ambulatory Visit: Payer: Self-pay

## 2019-11-05 DIAGNOSIS — R1312 Dysphagia, oropharyngeal phase: Secondary | ICD-10-CM | POA: Diagnosis not present

## 2019-11-05 DIAGNOSIS — R1310 Dysphagia, unspecified: Secondary | ICD-10-CM | POA: Insufficient documentation

## 2019-11-05 DIAGNOSIS — K224 Dyskinesia of esophagus: Secondary | ICD-10-CM | POA: Insufficient documentation

## 2019-11-05 DIAGNOSIS — R05 Cough: Secondary | ICD-10-CM | POA: Diagnosis not present

## 2019-11-05 NOTE — Therapy (Signed)
[facility] [place], [place], [address] Phone: [phone]   Fax:     Modified Barium Swallow  Patient Details  Name: Sean Reyes. MRN: [id number] Date of Birth: 29-Jan-1941 No data recorded  Encounter Date: 11/05/2019   End of Session - 11/05/19 1357    Visit Number 1    Number of Visits 1    Date for SLP Re-Evaluation 11/05/19    SLP Start Time 54    SLP Stop Time  1315    SLP Time Calculation (min) 45 min    Activity Tolerance Patient tolerated treatment well           Past Medical History:  Diagnosis Date  . Anemia   . Anxiety   . Arthritis   . Atrial fibrillation ([place])   . Cataracts, bilateral   . Complication of anesthesia    pt reports low BP's after surgery at [facility] and difficulty awakening  . Depression   . Diabetes ([place])    dx 6-8 yrs ago  . Dysrhythmia    a-fib  . GERD (gastroesophageal reflux disease)    OCC TAKES ALKA SELTZER  . History of kidney stones    10-15 yrs ago  . 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 Hyperlipidemia   . Hypertension   . Nocturia   . S/P ablation of atrial fibrillation    Ablative therapy  . Sleep apnea    CPAP   . Tachycardia, unspecified     Past Surgical History:  Procedure Laterality Date  . ABLATION    . ANTERIOR LAT LUMBAR FUSION N/A 06/27/2017   Procedure: Anterior Lateral Lumbar Interbody  Fusion - Lumbar Two-Lumbar Three - Lumbar Three-Lumbar Four, Posterior Lumbar Interbody Fusion Lumbar Four- Five;  Surgeon: Kary Kos, MD;  Location: [place];  Service: Neurosurgery;  Laterality: N/A;  Anterior Lateral Lumbar Interbody  Fusion - Lumbar Two-Lumbar Three - Lumbar Three-Lumbar Four, Posterior Lumbar Interbody Fusion Lumbar Four- Five  . BACK SURGERY    . CARDIOVERSION N/A 08/29/2018   Procedure: CARDIOVERSION;  Surgeon: Corey Skains, MD;  Location: [facility];  Service: Cardiovascular;  Laterality: N/A;  .  CARDIOVERSION N/A 09/24/2018   Procedure: CARDIOVERSION;  Surgeon: Corey Skains, MD;  Location: [facility];  Service: Cardiovascular;  Laterality: N/A;  . COLONOSCOPY WITH PROPOFOL N/A 10/05/2015   Procedure: COLONOSCOPY WITH PROPOFOL;  Surgeon: Lollie Sails, MD;  Location: [facility] ENDOSCOPY;  Service: Endoscopy;  Laterality: N/A;  . ESOPHAGOGASTRODUODENOSCOPY (EGD) WITH PROPOFOL N/A 04/01/2018   Procedure: ESOPHAGOGASTRODUODENOSCOPY (EGD) WITH PROPOFOL;  Surgeon: Lollie Sails, MD;  Location: [facility] ENDOSCOPY;  Service: Endoscopy;  Laterality: N/A;  . HERNIA REPAIR    . JOINT REPLACEMENT Bilateral    hips  RT+  LEFT X2   . LUMBAR LAMINECTOMY/DECOMPRESSION MICRODISCECTOMY Left 09/13/2016   Procedure: Microdiscectomy - Lumbar two-three,  Lumbar three- - left;  Surgeon: Kary Kos, MD;  Location: [place];  Service: Neurosurgery;  Laterality: Left;  . SPINAL CORD STIMULATOR INSERTION  07/08/2019  . TONSILLECTOMY      There were no vitals filed for this visit.   Subjective: Patient behavior: (alertness, ability to follow instructions, etc.):  The patient is alert, able to provide his swallowing history, and follow directions.  Chief complaint: Globus sensation   Objective:  Radiological Procedure: A videoflouroscopic evaluation of oral-preparatory, reflex initiation, and pharyngeal phases of the swallow was performed; as well as  a screening of the upper esophageal phase.  I. POSTURE: Upright in MBS chair  II. VIEW: Lateral  III. COMPENSATORY STRATEGIES: N/A  IV. BOLUSES ADMINISTERED:   Thin Liquid: 1 small, 3 rapid consecutive   Nectar-thick Liquid: 1 large   Honey-thick Liquid: DNT   Puree: 1 teaspoon, 1 heaping teaspoon   Mechanical Soft: 1/4 graham cracker in applesauce   Barium tablet  V. RESULTS OF EVALUATION: A. ORAL PREPARATORY PHASE: (The lips, tongue, and velum are observed for strength and coordination)       **Overall Severity Rating: within normal  limits  B. SWALLOW INITIATION/REFLEX: (The reflex is normal if "triggered" by the time the bolus reached the base of the tongue)  **Overall Severity Rating: Mild; triggers while falling from the valleculae to the pyriform sinuses  C. PHARYNGEAL PHASE: (Pharyngeal function is normal if the bolus shows rapid, smooth, and continuous transit through the pharynx and there is no pharyngeal residue after the swallow)  **Overall Severity Rating: within normal limits  D. LARYNGEAL PENETRATION: (Material entering into the laryngeal inlet/vestibule but not aspirated) TRANSIENT laryngeal penetration, fully clears with completion of the swallow  E. ASPIRATION: None  F. ESOPHAGEAL PHASE: (Screening of the upper esophagus) Stasis of barium tablet in mid-esophagus; patient sensed as in his throat  ASSESSMENT: This 79 year old man; with globus sensation; is presenting with minimal oropharyngeal dysphagia characterized by delayed pharyngeal swallow initiation and transient laryngeal penetration.  Oral control of the bolus including oral hold, rotary mastication, and anterior to posterior transfer is within functional limits.   Aspects of the pharyngeal stage of swallowing including tongue base retraction, hyolaryngeal excursion, epiglottic inversion, and duration/amplitude of UES opening are within normal limits.  There is no observed pharyngeal residue or tracheal aspiration.  The patient is not at risk for prandial aspiration and his symptom to do not appear to be related to oropharyngeal swallow function.  Delayed pharyngeal swallow initiation is consistent with effects of laryngopharyngeal reflux (inflammation, edema, and resultant decreased sensation of the larynx and pharynx).  A barium tablet lodged in the mid-esophagus, passing into the stomach given water.  The patient was assured that the globus sensation is apparently referred from the esophagus and does not present with an aspiration or choking  risk.  PLAN/RECOMMENDATIONS:   A. Diet: Regular   B. Swallowing Precautions: Esophageal dysmotility precautions   C. Recommended consultation to: follow up with GI as recommended, ENT consult   D. Therapy recommendations: speech therapy is not indicated for swallowing   E. Results and recommendations were discussed with the patient immediately following the study and the final report routed to the referring MD.    Oropharyngeal dysphagia - Plan: DG SWALLOW FUNC OP MEDICARE SPEECH PATH, DG SWALLOW FUNC OP MEDICARE SPEECH PATH  Pill dysphagia - Plan: DG SWALLOW FUNC OP [place], DG SWALLOW FUNC OP MEDICARE SPEECH PATH  Esophageal dysmotility - Plan: DG SWALLOW FUNC OP MEDICARE SPEECH PATH, DG SWALLOW FUNC OP MEDICARE SPEECH PATH        Problem List Patient Active Problem List   Diagnosis Date Noted  . Lab test positive for detection of COVID-19 virus 10/02/2019  . Elevated TSH 08/20/2019  . Acute cystitis with hematuria 08/20/2019  . Urinary retention 08/01/2019  . Fatigue 07/30/2019  . CKD (chronic kidney disease) stage 3, GFR 30-59 ml/min 01/19/2019  . Acquired thrombophilia ([place]) 01/19/2019  . 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 Postlaminectomy syndrome, lumbar region 01/16/2019  . History of fusion of  lumbar spine (L2-L5) 01/16/2019  . Chronic radicular lumbar pain 01/16/2019  . HNP (herniated nucleus pulposus), lumbar 04/29/2018  . Advanced care planning/counseling discussion 09/28/2016  . Spinal stenosis, lumbar region, 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/13/2016  . Low testosterone 10/07/2015  . Hyperlipidemia associated with type 2 diabetes mellitus ([place]) 07/14/2015  . Anemia 06/30/2015  . 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 OSA (obstructive sleep apnea) 03/23/2015  . Type 2 diabetes mellitus with diabetic chronic kidney disease ([place]) 01/05/2015  . Hypertension associated with diabetes ([place]) 09/28/2014  . Diabetes  mellitus with autonomic neuropathy ([place]) 09/28/2014  . Arthritis 10/20/2011  . H/O prior ablation treatment 10/19/2011  . Paroxysmal atrial fibrillation ([place]) 10/19/2011   Leroy Sea, MS/CCC- SLP  Lou Miner 11/05/2019, 1:58 PM  [facility] [place], [place], [address] Phone: [phone]   Fax:     Name: Sean Reyes. MRN: [id number] Date of Birth: 05-28-1940

## 2019-11-12 DIAGNOSIS — K219 Gastro-esophageal reflux disease without esophagitis: Secondary | ICD-10-CM | POA: Diagnosis not present

## 2019-11-12 DIAGNOSIS — R1314 Dysphagia, pharyngoesophageal phase: Secondary | ICD-10-CM | POA: Diagnosis not present

## 2019-11-19 ENCOUNTER — Telehealth: Payer: Medicare Other | Admitting: General Practice

## 2019-11-19 ENCOUNTER — Ambulatory Visit (INDEPENDENT_AMBULATORY_CARE_PROVIDER_SITE_OTHER): Payer: Medicare Other | Admitting: General Practice

## 2019-11-19 DIAGNOSIS — M961 Postlaminectomy syndrome, not elsewhere classified: Secondary | ICD-10-CM

## 2019-11-19 DIAGNOSIS — I152 Hypertension secondary to endocrine disorders: Secondary | ICD-10-CM | POA: Diagnosis not present

## 2019-11-19 DIAGNOSIS — N1831 Chronic kidney disease, stage 3a: Secondary | ICD-10-CM

## 2019-11-19 DIAGNOSIS — I480 Paroxysmal atrial fibrillation: Secondary | ICD-10-CM

## 2019-11-19 DIAGNOSIS — E1122 Type 2 diabetes mellitus with diabetic chronic kidney disease: Secondary | ICD-10-CM | POA: Diagnosis not present

## 2019-11-19 DIAGNOSIS — E1169 Type 2 diabetes mellitus with other specified complication: Secondary | ICD-10-CM | POA: Diagnosis not present

## 2019-11-19 DIAGNOSIS — E1159 Type 2 diabetes mellitus with other circulatory complications: Secondary | ICD-10-CM

## 2019-11-19 DIAGNOSIS — E785 Hyperlipidemia, unspecified: Secondary | ICD-10-CM | POA: Diagnosis not present

## 2019-11-19 DIAGNOSIS — R339 Retention of urine, unspecified: Secondary | ICD-10-CM
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
## 2019-11-20 ENCOUNTER — Other Ambulatory Visit: Payer: Self-pay | Admitting: Nurse Practitioner

## 2019-11-23 ENCOUNTER — Encounter: Payer: Self-pay | Admitting: Nurse Practitioner

## 2019-11-25 ENCOUNTER — Ambulatory Visit (INDEPENDENT_AMBULATORY_CARE_PROVIDER_SITE_OTHER): Payer: Medicare Other | Admitting: Nurse Practitioner

## 2019-11-25 ENCOUNTER — Other Ambulatory Visit: Payer: Self-pay

## 2019-11-25 ENCOUNTER — Encounter: Payer: Self-pay | Admitting: Nurse Practitioner

## 2019-11-25 VITALS — BP 136/80 | HR 60 | Temp 98.8°F | Wt 197.0 lb

## 2019-11-25 DIAGNOSIS — D649 Anemia, unspecified: Secondary | ICD-10-CM | POA: Diagnosis not present

## 2019-11-25 DIAGNOSIS — M961 Postlaminectomy syndrome, not elsewhere classified: Secondary | ICD-10-CM

## 2019-11-25 DIAGNOSIS — I480 Paroxysmal atrial fibrillation: Secondary | ICD-10-CM | POA: Diagnosis not present

## 2019-11-25 DIAGNOSIS — N1831 Chronic kidney disease, stage 3a: Secondary | ICD-10-CM | POA: Diagnosis not present

## 2019-11-25 DIAGNOSIS — E1169 Type 2 diabetes mellitus with other specified complication: Secondary | ICD-10-CM

## 2019-11-25 DIAGNOSIS — I152 Hypertension secondary to endocrine disorders: Secondary | ICD-10-CM

## 2019-11-25 DIAGNOSIS — Z8616 Personal history of COVID-19: Secondary | ICD-10-CM | POA: Diagnosis not present

## 2019-11-25 DIAGNOSIS — E1122 Type 2 diabetes mellitus with diabetic chronic kidney disease: Secondary | ICD-10-CM

## 2019-11-25 DIAGNOSIS — E785 Hyperlipidemia, unspecified: Secondary | ICD-10-CM | POA: Diagnosis not present

## 2019-11-25 DIAGNOSIS — E1159 Type 2 diabetes mellitus with other circulatory complications: Secondary | ICD-10-CM

## 2019-11-25 MED ORDER — GABAPENTIN 300 MG PO CAPS: 300.0000 mg | ORAL_CAPSULE | Freq: Two times a day (BID) | ORAL | 4 refills | Status: DC

## 2019-11-25 MED ORDER — ROSUVASTATIN CALCIUM 5 MG PO TABS: 5.0000 mg | ORAL_TABLET | Freq: Every day | ORAL | 3 refills | Status: DC

## 2019-11-25 MED ORDER — ALBUTEROL SULFATE HFA 108 (90 BASE) MCG/ACT IN AERS: 2.0000 | INHALATION_SPRAY | Freq: Four times a day (QID) | RESPIRATORY_TRACT | 2 refills | Status: DC | PRN

## 2019-11-25 MED ORDER — FLUTICASONE PROPIONATE 50 MCG/ACT NA SUSP
2.0000 | Freq: Two times a day (BID) | NASAL | 6 refills | Status: DC
Start: 2019-11-25 — End: 2020-09-09

## 2019-11-25 NOTE — Assessment & Plan Note (Signed)
Chronic, ongoing.  No current statin, would benefit from this, discussed with him today and educated on ASCVD -- he is agreeable to starting Rosuvastatin.  Will trial it, as has underlying chronic pain issues, at lowest dose to start 5 MG daily.  Educated him on side effects to alert provider of.  Lipid panel today.

## 2019-11-25 NOTE — Assessment & Plan Note (Addendum)
Chronic, stable with BP at goal for age in office, even with his self medication changes at home post surgery.  A1C 6.4% today.  Continue current medication regimen and collaboration with cardiology.  Recommend he continue to check BP at home at least 3 mornings a week and document.  BMP today.  Return in 3 months.

## 2019-11-25 NOTE — Assessment & Plan Note (Signed)
Chronic pain related to this -- will place referral to pain management.  Has seen Dr. Holley Raring before and appreciated him, his goal is not to take opioids.  Will trial Gabapentin 300 MG BID for nerve pain.  Script sent.

## 2019-11-25 NOTE — Assessment & Plan Note (Signed)
Chronic, ongoing with A1C 6.4% today.  Continue Benazepril for kidney protection and may consider reduction of Metformin in future dependent on kidney function on labs.  Educated him on kidney disease and monitoring.  Consider referral to nephrology in future if ever any worsening of function noted.  Return in 3 months for T2DM check.

## 2019-11-25 NOTE — Patient Instructions (Signed)

## 2019-11-25 NOTE — Assessment & Plan Note (Signed)
Chronic, ongoing.  Continue current medication regimen and collaboration with cardiology.  Monitor CBC closely due to anemia.

## 2019-11-25 NOTE — Assessment & Plan Note (Signed)
Improving at this time, refills sent on Albuterol and Flonase to use as needed.

## 2019-11-25 NOTE — Assessment & Plan Note (Signed)
CBC today, patient on Eliquis.  Some improvement on recent labs -- is post surgical -- risk for bleeding.

## 2019-11-25 NOTE — Assessment & Plan Note (Signed)
Chronic, ongoing.  Continue to monitor closely and refer to nephrology if decline.  Continue Benazepril for kidney protection.  Renal dose medications as needed based on labs.

## 2019-11-25 NOTE — Progress Notes (Signed)
BP 136/80   Pulse 60   Temp 98.8 F (37.1 C) (Oral)   Wt 197 lb (89.4 kg)   SpO2 98%   BMI 25.99 kg/m    Subjective:    Patient ID: Sean Hacker., male    DOB: 12/12/1940, 79 y.o.   MRN: [id number]  HPI: Sean Nardozzi. is a 79 y.o. male  Chief Complaint  Patient presents with  . Follow-up   DIABETES Recent A1C in July 6.3%.Continues on Metformin daily. Hypoglycemic episodes:no Polydipsia/polyuria:no Visual disturbance:no Chest pain:no Paresthesias:no Glucose Monitoring:yes Accucheck frequency: a couple times week Fasting glucose:120 range Post prandial: Evening: Before meals: Taking Insulin?:no Long acting insulin: Short acting insulin: Blood Pressure Monitoring:a few times a day Retinal Examination:Up to Date Foot Exam:Up to Date Pneumovax:Up to Date Influenza:Up to Date Aspirin:no  HYPERTENSION / HYPERLIPIDEMIA Followed by Dr. Nehemiah Massed for cardiology. Last seen7/8/21 with Metoprolol reduced to 25 MG BID due to bradycardia. Started on Eliquis over a year agofor atrial fibrillation. Refuses statin.  Has history of Covid and received MAB infusion 10/02/19.  Continues to have a dry, non-productive cough intermittently (improving) -- using Albuterol as needed. Satisfied with current treatment?yes Duration of hypertension:chronic BP monitoring frequency:a few times a week BP range:130/80's BP medication side effects:no Duration of hyperlipidemia:chronic Cholesterol medication side effects:no Cholesterol supplements: none Medication compliance:good compliance Aspirin:no Recent stressors:no Recurrent headaches:no Visual changes:no Palpitations:no Dyspnea:no Chest pain:no Lower extremity edema:no Dizzy/lightheaded:no The 10-year ASCVD risk score Mikey Bussing DC Jr., et al., 2013) is: 55.4%   Values used to calculate the score:     Age:  14 years     Sex: Male     Is Non-Hispanic African American: No     Diabetic: Yes     Tobacco smoker: No     Systolic Blood Pressure: 563 mmHg     Is BP treated: Yes     HDL Cholesterol: 40 mg/dL     Total Cholesterol: 146 mg/dL  CHRONIC KIDNEY DISEASE DecemberGFR55and CRT 1.26. CKD status:stable Medications renally dose:yes Previous renal evaluation:no Pneumovax:Up to Date Influenza Vaccine:Up to Date CrCl cannot be calculated (Patient's most recent lab result is older than the maximum 21 days allowed.).   CHRONIC PAIN Had thoracic spinal cord stimulator placed initially on 07/01/2019 and then permanent placement on 07/07/2019 -- is still struggling finding correct settings -- talking to people on phone about pain settings and adjusting.  CBC H/H 11.5/34.2, MCV 91, B12 692, iron 72, ferritin 72.    Has area to right lower leg where he hit it against item several years ago and this area causes pain at times -- reports nerve pain -- sharp shooting to area. Duration:weeks Lower extremity edema:no Arthralgias:no Myalgias:no Weakness:no Rash:no  Relevant past medical, surgical, family and social history reviewed and updated as indicated. Interim medical history since our last visit reviewed. Allergies and medications reviewed and updated.  Review of Systems  Constitutional: Negative for activity change, appetite change, diaphoresis, fatigue and fever.  Respiratory: Positive for cough (intermittent and improving). Negative for chest tightness, shortness of breath and wheezing.   Cardiovascular: Negative for chest pain, palpitations and leg swelling.  Gastrointestinal: Negative.   Endocrine: Negative for cold intolerance, heat intolerance, polydipsia, polyphagia and polyuria.  Musculoskeletal: Positive for arthralgias.  Neurological: Negative.   Psychiatric/Behavioral: Negative.     Per HPI unless specifically indicated above     Objective:    BP 136/80    Pulse 60   Temp 98.8 F (37.1 C) (Oral)  Wt 197 lb (89.4 kg)   SpO2 98%   BMI 25.99 kg/m   Wt Readings from Last 3 Encounters:  11/25/19 197 lb (89.4 kg)  10/22/19 190 lb (86.2 kg)  10/01/19 187 lb (84.8 kg)    Physical Exam Vitals and nursing note reviewed.  Constitutional:      General: He is awake. He is not in acute distress.    Appearance: He is well-developed and well-groomed. He is not ill-appearing.  HENT:     Head: Normocephalic and atraumatic.     Right Ear: Hearing normal. No drainage.     Left Ear: Hearing normal. No drainage.  Eyes:     General: Lids are normal.        Right eye: No discharge.        Left eye: No discharge.     Conjunctiva/sclera: Conjunctivae normal.     Pupils: Pupils are equal, round, and reactive to light.  Neck:     Thyroid: No thyromegaly.     Vascular: No carotid bruit.     Trachea: Trachea normal.  Cardiovascular:     Rate and Rhythm: Normal rate and regular rhythm.     Heart sounds: Normal heart sounds, S1 normal and S2 normal. No murmur heard.  No gallop.   Pulmonary:     Effort: Pulmonary effort is normal. No accessory muscle usage or respiratory distress.     Breath sounds: Normal breath sounds.  Abdominal:     General: Bowel sounds are normal. There is no distension.     Palpations: Abdomen is soft. There is no hepatomegaly.     Tenderness: There is no abdominal tenderness. There is no right CVA tenderness or left CVA tenderness.  Musculoskeletal:        General: Normal range of motion.     Cervical back: Normal range of motion and neck supple.     Right lower leg: No edema.     Left lower leg: No edema.  Lymphadenopathy:     Cervical: No cervical adenopathy.  Skin:    General: Skin is warm and dry.     Capillary Refill: Capillary refill takes less than 2 seconds.  Neurological:     Mental Status: He is alert and oriented to person, place, and time.     Deep Tendon Reflexes:     Reflex Scores:      Patellar reflexes  are 1+ on the right side and 1+ on the left side.      Achilles reflexes are 1+ on the right side and 1+ on the left side.    Comments: Antalgic gait, baseline, with use of crutches.  Psychiatric:        Attention and Perception: Attention normal.        Mood and Affect: Mood normal.        Speech: Speech normal.        Behavior: Behavior normal. Behavior is cooperative.        Thought Content: Thought content normal.     Results for orders placed or performed during the hospital encounter of 10/01/19  SARS CORONAVIRUS 2 (TAT 6-24 HRS) Nasopharyngeal Nasopharyngeal Swab   Specimen: Nasopharyngeal Swab  Result Value Ref Range   SARS Coronavirus 2 POSITIVE (A) NEGATIVE      Assessment & Plan:   Problem List Items Addressed This Visit      Cardiovascular and Mediastinum   Hypertension associated with diabetes ([place])    Chronic, stable with BP at goal  for age in office, even with his self medication changes at home post surgery.  A1C 6.4% today.  Continue current medication regimen and collaboration with cardiology.  Recommend he continue to check BP at home at least 3 mornings a week and document.  BMP today.  Return in 3 months.      Relevant Medications   rosuvastatin (CRESTOR) 5 MG tablet   Other Relevant Orders   Basic metabolic panel   HgB E4M   CBC with Differential/Platelet   Paroxysmal atrial fibrillation (HCC)    Chronic, ongoing.  Continue current medication regimen and collaboration with cardiology.  Monitor CBC closely due to anemia.      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  Continue Benazepril for kidney protection and may consider reduction of Metformin in future dependent on kidney function on labs.  Educated him on kidney disease and monitoring.  Consider referral to nephrology in future if ever any  worsening of function noted.  Return in 3 months for T2DM check.      Relevant Medications   rosuvastatin (CRESTOR) 5 MG tablet   Other Relevant Orders   HgB A1c   Hyperlipidemia associated with type 2 diabetes mellitus (HCC)    Chronic, ongoing.  No current statin, would benefit from this, discussed with him today and educated on ASCVD -- he is agreeable to starting Rosuvastatin.  Will trial it, as has underlying chronic pain issues, at lowest dose to start 5 MG daily.  Educated him on side effects to alert provider of.  Lipid panel today.      Relevant Medications   rosuvastatin (CRESTOR) 5 MG tablet   Other Relevant Orders   Lipid Panel w/o Chol/HDL Ratio   HgB A1c     Genitourinary   CKD (chronic kidney disease) stage 3, GFR 30-59 ml/min (HCC)    Chronic, ongoing.  Continue to monitor closely and refer to nephrology if decline.  Continue Benazepril for kidney protection.  Renal dose medications as needed based on labs.        Other   Anemia    CBC today, patient on Eliquis.  Some improvement on recent labs -- is post surgical -- risk for bleeding.      Postlaminectomy syndrome, lumbar region    Chronic pain related to this -- will place referral to pain management.  Has seen Dr. Holley Raring before and appreciated him, his goal is not to take opioids.  Will trial Gabapentin 300 MG BID for nerve pain.  Script sent.      Relevant Orders   Ambulatory referral to Pain Clinic   History of 2019 novel coronavirus disease (COVID-19)    Improving at this time, refills sent on Albuterol and Flonase to use as needed.          Follow up plan: Return in about 3 months (around 02/25/2020) for T2DM, HTN/HLD, CKD, BPH, A-FIB.

## 2019-11-26 LAB — CBC WITH DIFFERENTIAL/PLATELET
Basophils Absolute: 0 10*3/uL (ref 0.0–0.2)
Basos: 0 %
EOS (ABSOLUTE): 0.1 10*3/uL (ref 0.0–0.4)
Eos: 1 %
Hematocrit: 36.1 % — ABNORMAL LOW (ref 37.5–51.0)
Hemoglobin: 11.9 g/dL — ABNORMAL LOW (ref 13.0–17.7)
Immature Grans (Abs): 0 10*3/uL (ref 0.0–0.1)
Immature Granulocytes: 0 %
Lymphocytes Absolute: 1.6 10*3/uL (ref 0.7–3.1)
Lymphs: 25 %
MCH: 30.1 pg (ref 26.6–33.0)
MCHC: 33 g/dL (ref 31.5–35.7)
MCV: 91 fL (ref 79–97)
Monocytes Absolute: 0.9 10*3/uL (ref 0.1–0.9)
Monocytes: 14 %
Neutrophils Absolute: 3.9 10*3/uL (ref 1.4–7.0)
Neutrophils: 60 %
Platelets: 190 10*3/uL (ref 150–450)
RBC: 3.96 x10E6/uL — ABNORMAL LOW (ref 4.14–5.80)
RDW: 14.3 % (ref 11.6–15.4)
WBC: 6.5 10*3/uL (ref 3.4–10.8)

## 2019-11-26 LAB — BASIC METABOLIC PANEL
BUN/Creatinine Ratio: 16 (ref 10–24)
BUN: 18 mg/dL (ref 8–27)
CO2: 24 mmol/L (ref 20–29)
Calcium: 9.7 mg/dL (ref 8.6–10.2)
Chloride: 99 mmol/L (ref 96–106)
Creatinine, Ser: 1.13 mg/dL (ref 0.76–1.27)
GFR calc Af Amer: 72 mL/min/{1.73_m2} (ref >59)
GFR calc non Af Amer: 62 mL/min/{1.73_m2} (ref >59)
Glucose: 148 mg/dL — ABNORMAL HIGH (ref 65–99)
Potassium: 4.1 mmol/L (ref 3.5–5.2)
Sodium: 137 mmol/L (ref 134–144)

## 2019-11-26 LAB — HEMOGLOBIN A1C
Est. average glucose Bld gHb Est-mCnc: 143 mg/dL
Hgb A1c MFr Bld: 6.6 % — ABNORMAL HIGH (ref 4.8–5.6)

## 2019-11-26 LAB — LIPID PANEL W/O CHOL/HDL RATIO
Cholesterol, Total: 149 mg/dL (ref 100–199)
HDL: 44 mg/dL (ref >39)
LDL Chol Calc (NIH): 87 mg/dL (ref 0–99)
Triglycerides: 98 mg/dL (ref 0–149)
VLDL Cholesterol Cal: 18 mg/dL (ref 5–40)

## 2019-11-26 NOTE — Progress Notes (Signed)
Contacted via [place] afternoon [place], your labs have returned and overall they are remaining stable.  You continue to have some mild anemia, but this is staying at baseline and will recheck next visit.  Continue Protonix daily and your Vitamin B12 500 to 1000 MCG daily.  If any questions please let me know.   Keep being awesome!!  Thank you for allowing me to participate in your care. Kindest regards, Annibelle Brazie

## 2019-11-28 DIAGNOSIS — Z23 Encounter for immunization: Secondary | ICD-10-CM | POA: Diagnosis not present

## 2019-12-04 ENCOUNTER — Other Ambulatory Visit: Payer: Self-pay | Admitting: Nurse Practitioner

## 2019-12-04 NOTE — Telephone Encounter (Signed)
Requested medication (s) are due for refill today:yes  Requested medication (s) are on the active medication list: {yes  Last refill: 08/23/19  #180  0 refills  Future visit scheduled: yes  Notes to clinic:  H/H low     Requested Prescriptions  Pending Prescriptions Disp Refills   ELIQUIS 5 MG TABS tablet [Pharmacy Med Name: ELIQUIS 5 MG TABLET] 60 tablet 0    Sig: Take 1 tablet (5 mg total) by mouth 2 (two) times daily.      Hematology:  Anticoagulants Failed - 12/04/2019  3:41 PM      Failed - HGB in normal range and within 360 days    Hemoglobin  Date Value Ref Range Status  11/25/2019 11.9 (L) 13.0 - 17.7 g/dL Final          Failed - HCT in normal range and within 360 days    Hematocrit  Date Value Ref Range Status  11/25/2019 36.1 (L) 37.5 - 51.0 % Final          Passed - PLT in normal range and within 360 days    Platelets  Date Value Ref Range Status  11/25/2019 190 150 - 450 x10E3/uL Final          Passed - Cr in normal range and within 360 days    Creatinine  Date Value Ref Range Status  07/01/2012 1.27 0.60 - 1.30 mg/dL Final   Creatinine, Ser  Date Value Ref Range Status  11/25/2019 1.13 0.76 - 1.27 mg/dL Final          Passed - Valid encounter within last 12 months    Recent Outpatient Visits           1 week ago Type 2 diabetes mellitus with stage 3a chronic kidney disease, without long-term current use of insulin ([place])   [place], [place] T, NP   1 month ago Lab test positive for detection of COVID-19 virus   [employer], Rosenhayn T, NP   1 month ago Cough   [facility] [place], Malachy Mood, NP   2 months ago Lab test positive for detection of COVID-19 virus   [place], [place] T, NP   3 months ago Type 2 diabetes mellitus with stage 3a chronic kidney disease, without long-term current use of insulin ([place])   [place], [name], NP        Future Appointments             In 2 months [name], [name], NP [facility]   In 9 months  [facility]

## 2019-12-05 DIAGNOSIS — R351 Nocturia: Secondary | ICD-10-CM | POA: Diagnosis not present

## 2019-12-05 DIAGNOSIS — R3915 Urgency of urination: Secondary | ICD-10-CM | POA: Diagnosis not present

## 2019-12-05 DIAGNOSIS — R338 Other retention of urine: Secondary | ICD-10-CM | POA: Diagnosis not present

## 2019-12-05 DIAGNOSIS — N3281 Overactive bladder: Secondary | ICD-10-CM | POA: Diagnosis not present

## 2019-12-11 ENCOUNTER — Encounter: Payer: Self-pay | Admitting: Student in an Organized Health Care Education/Training Program

## 2019-12-11 ENCOUNTER — Other Ambulatory Visit: Payer: Self-pay

## 2019-12-11 ENCOUNTER — Ambulatory Visit
Payer: Medicare Other | Attending: Student in an Organized Health Care Education/Training Program | Admitting: Student in an Organized Health Care Education/Training Program

## 2019-12-11 VITALS — BP 148/85 | HR 62 | Temp 98.1°F | Ht 73.0 in | Wt 190.0 lb

## 2019-12-11 DIAGNOSIS — M48062 Spinal stenosis, lumbar region with neurogenic claudication: Secondary | ICD-10-CM | POA: Diagnosis not present

## 2019-12-11 DIAGNOSIS — G8929 Other chronic pain: Secondary | ICD-10-CM | POA: Diagnosis not present

## 2019-12-11 DIAGNOSIS — Z981 Arthrodesis status: Secondary | ICD-10-CM | POA: Insufficient documentation

## 2019-12-11 DIAGNOSIS — M961 Postlaminectomy syndrome, not elsewhere classified: Secondary | ICD-10-CM | POA: Diagnosis not present

## 2019-12-11 DIAGNOSIS — Z9689 Presence of other specified functional implants: Secondary | ICD-10-CM | POA: Insufficient documentation

## 2019-12-11 DIAGNOSIS — M5416 Radiculopathy, lumbar region: Secondary | ICD-10-CM | POA: Diagnosis not present

## 2019-12-11 NOTE — Progress Notes (Signed)
PROVIDER NOTE: Information contained herein reflects review and annotations entered in association with encounter. Interpretation of such information and data should be left to medically-trained personnel. Information provided to patient can be located elsewhere in the medical record under "Patient Instructions". Document created using STT-dictation technology, any transcriptional errors that may result from process are unintentional.    Patient: Sean Reyes.  Service Category: E/M  Provider: Gillis Santa, MD  DOB: 1940/05/24  DOS: 12/11/2019  Specialty: Interventional Pain Management  MRN: [id number]  Setting: Ambulatory outpatient  PCP: Sean Lick, NP  Type: Established Patient    Referring Provider: [name], NP  Location: Office  Delivery: Face-to-face     HPI  [name]., a 79 y.o. year old male, is here today because of his Failed back surgical syndrome [M96.1]. [name] primary complain today is Back Pain  Pertinent problems: [name] has H/O prior ablation treatment; Paroxysmal atrial fibrillation ([place]); Type 2 diabetes mellitus with diabetic chronic kidney disease ([place]); Spinal stenosis, lumbar region, with neurogenic claudication; HNP (herniated nucleus pulposus), lumbar; Failed back surgical syndrome; Postlaminectomy syndrome, lumbar region; History of fusion of lumbar spine (L2-L5); Chronic radicular lumbar pain; and Spinal cord stimulator status on their pertinent problem list. Pain Assessment: Severity of Chronic pain is reported as a 7 /10. Location: Back Lower/Denies. Onset: More than a month ago. Quality: Aching, Burning, Throbbing. Timing: Constant. Modifying factor(s): sit down. Vitals:  height is 6' 1"  (1.854 m) and weight is 190 lb (86.2 kg). His temperature is 98.1 F (36.7 C). His blood pressure is 148/85 (abnormal) and his pulse is 62. His oxygen saturation is 100%.   Reason for encounter: follow-up evaluation   [name] follows up today for  low back pain and bilateral leg pain, right greater than left.  Since his last visit with me, he has been implanted with a NEVRO spinal cord stimulator by Dr. Mearl Reyes at [facility]  He has been working with spinal cord stimulator representative to optimize program settings.  He feels that the spinal cord stimulator is helping him out somewhat but it took a while to experience analgesic benefit.  He also endorses very severe right lower extremity pain localized to his posterior lateral calf.  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  Patient is currently on gabapentin 300 mg twice a day that was recently started by Sean Reyes.  I instructed the patient to increase that to 3 times a day in 2 to 3 weeks if he is not having any side effects of lower extremity swelling, sedation, cognitive dysfunction.  Otherwise we spent a great deal of time discussing the importance of paraspinal muscle strengthening, the impact of pain and chronic deconditioning.  Recommend physical therapy for the patient.   ROS  Constitutional: Denies any fever or chills Gastrointestinal: No reported hemesis, hematochezia, vomiting, or acute GI distress Musculoskeletal: Denies any acute onset joint swelling, redness, loss of ROM, or weakness Neurological: No reported episodes of acute onset apraxia, aphasia, dysarthria, agnosia, amnesia, paralysis, loss of coordination, or loss of consciousness  Medication Review  acetaminophen, albuterol, apixaban, benazepril, finasteride, fluticasone, gabapentin, metFORMIN, metoprolol succinate, pantoprazole, propafenone, rosuvastatin, and tamsulosin  History Review  Allergy: [name] is allergic to levaquin [levofloxacin in d5w], shellfish allergy, amiodarone, and adhesive [tape]. Drug: [name]  reports no history of drug use. Alcohol:  reports no history of alcohol use. Tobacco:  reports that  he has never  smoked. He has never used smokeless tobacco. Social: [name]  reports that he has never smoked. He has never used smokeless tobacco. He reports that he does not drink alcohol and does not use drugs. Medical:  has a past medical history of Anemia, Anxiety, Arthritis, Atrial fibrillation ([place]), Cataracts, bilateral, Complication of anesthesia, Depression, Diabetes ([place]), Dysrhythmia, GERD (gastroesophageal reflux disease), History of kidney stones, HOH (hard of hearing), Hyperlipidemia, Hypertension, Nocturia, S/P ablation of atrial fibrillation, Sleep apnea, and Tachycardia, unspecified. Surgical: [name]  has a past surgical history that includes Tonsillectomy; Hernia repair; Ablation; Colonoscopy with propofol (N/A, 10/05/2015); Lumbar laminectomy/decompression microdiscectomy (Left, 09/13/2016); Anterior lat lumbar fusion (N/A, 06/27/2017); Joint replacement (Bilateral); Back surgery; Esophagogastroduodenoscopy (egd) with propofol (N/A, 04/01/2018); Cardioversion (N/A, 08/29/2018); Cardioversion (N/A, 09/24/2018); and Spinal cord stimulator insertion (07/08/2019). Family: family history includes Brain cancer in his mother.  Laboratory Chemistry Profile   Renal Lab Results  Component Value Date   BUN 18 11/25/2019   CREATININE 1.13 11/25/2019   BCR 16 11/25/2019   GFRAA 72 11/25/2019   GFRNONAA 62 11/25/2019     Hepatic Lab Results  Component Value Date   AST 13 07/30/2019   ALT 10 07/30/2019   ALBUMIN 4.1 07/30/2019   ALKPHOS 71 07/30/2019   LIPASE 25 12/19/2018     Electrolytes Lab Results  Component Value Date   NA 137 11/25/2019   K 4.1 11/25/2019   CL 99 11/25/2019   CALCIUM 9.7 11/25/2019   MG 2.1 07/23/2018     Bone Lab Results  Component Value Date   TESTOFREE 6.0 (L) 09/01/2015   TESTOSTERONE 642 12/11/2016     Inflammation (CRP: Acute Phase) (ESR: Chronic Phase) No results found for: CRP, ESRSEDRATE, LATICACIDVEN     Note: Above Lab results reviewed.  Recent  Imaging Review  DG SWALLOW FUNC OP MEDICARE SPEECH PATH Please refer to "Notes" tab for Speech Pathology notes.  CLINICAL DATA:  Dysphagia. Cough/GE reflux disease/other secondary diagnosis  EXAM: MODIFIED BARIUM SWALLOW  TECHNIQUE: Different consistencies of barium were administered orally to the patient by the Speech Pathologist. Imaging of the pharynx was performed in the lateral projection. The radiologist was present in the fluoroscopy room for this study, providing personal supervision.  FLUOROSCOPY TIME:  Fluoroscopy Time:  1.5 minute  Radiation Exposure Index (if provided by the fluoroscopic device): 6.1 mGy  Number of Acquired Spot Images: 0  COMPARISON:  None.  FINDINGS: Real-time fluoroscopy of the swallowing function was performed with a speech pathologist present.  Multiple consistencies of barium were administered which included water, nectar, applesauce, and Graham cracker.  Normal swallow function. Flash laryngeal penetration. No tracheal aspiration.  IMPRESSION: Modified barium swallow as described above.  Please refer to the Speech Pathologists report for complete details and recommendations.  Electronically Signed   By: Kathreen Devoid   On: 11/05/2019 15:32 Note: Reviewed        Physical Exam  General appearance: Well nourished, well developed, and well hydrated. In no apparent acute distress Mental status: Alert, oriented x 3 (person, place, & time)       Respiratory: No evidence of acute respiratory distress Eyes: PERLA Vitals: BP (!) 148/85   Pulse 62   Temp 98.1 F (36.7 C)   Ht 6' 1"  (1.854 m)   Wt 190 lb (86.2 kg)   SpO2 100%   BMI 25.07 kg/m  BMI: Estimated body mass index is 25.07 kg/m as calculated from the following:   Height  as of this encounter: 6' 1"  (1.854 m).   Weight as of this encounter: 190 lb (86.2 kg). Ideal: Ideal body weight: 79.9 kg (176 lb 2.4 oz) Adjusted ideal body weight: 82.4 kg (181 lb 11 oz)  Lumbar  Spine Area Exam  Skin & Axial Inspection: Well healed scar from previous spine surgery detected IPG present Alignment: Symmetrical Functional ROM: Pain restricted ROM       Stability: No instability detected Muscle Tone/Strength: Functionally intact. No obvious neuro-muscular anomalies detected. Sensory (Neurological): Dermatomal pain pattern and musculoskeletal  Gait & Posture Assessment  Ambulation: Limited Gait: Antalgic Posture: Difficulty standing up straight, due to pain  Lower Extremity Exam    Side: Right lower extremity  Side: Left lower extremity  Stability: No instability observed          Stability: No instability observed          Skin & Extremity Inspection: Skin color, temperature, and hair growth are WNL. No peripheral edema or cyanosis. No masses, redness, swelling, asymmetry, or associated skin lesions. No contractures.  Skin & Extremity Inspection: Skin color, temperature, and hair growth are WNL. No peripheral edema or cyanosis. No masses, redness, swelling, asymmetry, or associated skin lesions. No contractures.  Functional ROM: Pain restricted ROM for hip and knee joints          Functional ROM: Pain restricted ROM for hip and knee joints          Muscle Tone/Strength: Mild-to-moderate deconditioning  Muscle Tone/Strength: Mild-to-moderate deconditioning  Sensory (Neurological): Neurogenic pain pattern        Sensory (Neurological): Neurogenic pain pattern        DTR: Patellar: deferred today Achilles: deferred today Plantar: deferred today  DTR: Patellar: deferred today Achilles: deferred today Plantar: deferred today  Palpation: No palpable anomalies  Palpation: No palpable anomalies  4 out of 5 strength bilateral lower extremity: Plantar flexion, dorsiflexion, knee flexion, knee extension.   Assessment   Status Diagnosis  Persistent Persistent Persistent 1. Failed back surgical syndrome   2. Postlaminectomy syndrome, lumbar region   3. Chronic radicular  lumbar pain   4. History of fusion of lumbar spine (L2-L5)   5. Spinal stenosis, lumbar region, with neurogenic claudication   6. Spinal cord stimulator status      Updated Problems: Problem  Spinal Cord Stimulator Status  Failed Back Surgical Syndrome  Postlaminectomy Syndrome, Lumbar Region  History of fusion of lumbar spine (L2-L5)  Chronic Radicular Lumbar Pain  Hnp (Herniated Nucleus Pulposus), Lumbar  Spinal Stenosis, Lumbar Region, With Neurogenic Claudication  Type 2 Diabetes Mellitus With Diabetic Chronic Kidney Disease ([facility])  H/O Prior Ablation Treatment  Paroxysmal Atrial Fibrillation ([place])   Overview:  Sp ablation twice in the remote past      Plan of Care   [name]. has a current medication list which includes the following long-term medication(s): propafenone, albuterol, benazepril, eliquis, fluticasone, gabapentin, metformin, and rosuvastatin.  1.  Referral to physical therapy as below 2.  Continue with  spinal cord stimulation 3.  Increase gabapentin to 300 mg 3 times a day in 2 to 3 weeks if no side effects at current dose of 300 mg twice a day. 4.  Recommend lidocaine cream to right lower extremity  Orders:  Orders Placed This Encounter  Procedures  . Ambulatory referral to Physical Therapy    Referral Priority:   Routine    Referral Type:   Physical Medicine    Referral Reason:  Specialty Services Required    Requested Specialty:   Physical Therapy    Number of Visits Requested:   1   Follow-up plan:   Return in about 4 months (around 04/12/2020) for follow up.     Status post caudal 02/03/2019: 6 cc injected- not helpful, status post Nevro spinal cord stimulator implant with Dr. Mearl Reyes at [facility] Visits No visits were found meeting these conditions. Showing recent visits within past 90 days and meeting all other requirements Today's Visits Date Type Provider Dept  12/11/19 Office Visit Sean Santa, MD [facility]   Showing today's visits and meeting all other requirements Future Appointments No visits were found meeting these conditions. Showing future appointments within next 90 days and meeting all other requirements  I discussed the assessment and treatment plan with the patient. The patient was provided an opportunity to ask questions and all were answered. The patient agreed with the plan and demonstrated an understanding of the instructions.  Patient advised to call back or seek an in-person evaluation if the symptoms or condition worsens.  Duration of encounter: 91mnutes.  Note by: BGillis Santa MD Date: 12/11/2019; Time: 1:07 PM

## 2019-12-11 NOTE — Progress Notes (Signed)
Safety precautions to be maintained throughout the outpatient stay will include: orient to surroundings, keep bed in low position, maintain call bell within reach at all times, provide assistance with transfer out of bed and ambulation.  

## 2019-12-16 DIAGNOSIS — Z23 Encounter for immunization: Secondary | ICD-10-CM | POA: Diagnosis not present

## 2020-01-01 ENCOUNTER — Ambulatory Visit
Payer: Medicare Other | Attending: Student in an Organized Health Care Education/Training Program | Admitting: Physical Therapy

## 2020-01-01 ENCOUNTER — Encounter: Payer: Self-pay | Admitting: Physical Therapy

## 2020-01-01 ENCOUNTER — Other Ambulatory Visit: Payer: Self-pay

## 2020-01-01 DIAGNOSIS — R2689 Other abnormalities of gait and mobility: Secondary | ICD-10-CM

## 2020-01-01 DIAGNOSIS — M6281 Muscle weakness (generalized): Secondary | ICD-10-CM | POA: Diagnosis not present

## 2020-01-01 DIAGNOSIS — M545 Low back pain, unspecified: Secondary | ICD-10-CM | POA: Insufficient documentation

## 2020-01-01 DIAGNOSIS — G8929 Other chronic pain: Secondary | ICD-10-CM

## 2020-01-01 NOTE — Therapy (Signed)
[facility] [address] [place], [place], [address] Phone: [phone]   Fax:  [phone]  Physical Therapy Evaluation  Patient Details  Name: Sean Reyes. MRN: [id number] Date of Birth: 1940-12-25 Referring Provider (PT): Gillis Santa, MD   Encounter Date: 01/01/2020   PT End of Session - 01/01/20 1411    Visit Number 1    Number of Visits 9    Date for PT Re-Evaluation 01/29/20    Authorization - Number of Visits 1    Progress Note Due on Visit 10    PT Start Time 1300    PT Stop Time 1401    PT Time Calculation (min) 61 min    Activity Tolerance Patient tolerated treatment well;Patient limited by fatigue    Behavior During Therapy [facility] for tasks assessed/performed           Past Medical History:  Diagnosis Date  . Anemia   . Anxiety   . Arthritis   . Atrial fibrillation ([place])   . Cataracts, bilateral   . Complication of anesthesia    pt reports low BP's after surgery at [facility] and difficulty awakening  . Depression   . Diabetes ([place])    dx 6-8 yrs ago  . Dysrhythmia    a-fib  . GERD (gastroesophageal reflux disease)    OCC TAKES ALKA SELTZER  . History of kidney stones    10-15 yrs ago  . 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 Hyperlipidemia   . Hypertension   . Nocturia   . S/P ablation of atrial fibrillation    Ablative therapy  . Sleep apnea    CPAP   . Tachycardia, unspecified     Past Surgical History:  Procedure Laterality Date  . ABLATION    . ANTERIOR LAT LUMBAR FUSION N/A 06/27/2017   Procedure: Anterior Lateral Lumbar Interbody  Fusion - Lumbar Two-Lumbar Three - Lumbar Three-Lumbar Four, Posterior Lumbar Interbody Fusion Lumbar Four- Five;  Surgeon: Kary Kos, MD;  Location: [facility];  Service: Neurosurgery;  Laterality: N/A;  Anterior Lateral Lumbar Interbody  Fusion - Lumbar Two-Lumbar Three - Lumbar Three-Lumbar Four, Posterior Lumbar Interbody Fusion Lumbar Four-  Five  . BACK SURGERY    . CARDIOVERSION N/A 08/29/2018   Procedure: CARDIOVERSION;  Surgeon: Corey Skains, MD;  Location: [facility];  Service: Cardiovascular;  Laterality: N/A;  . CARDIOVERSION N/A 09/24/2018   Procedure: CARDIOVERSION;  Surgeon: Corey Skains, MD;  Location: [facility];  Service: Cardiovascular;  Laterality: N/A;  . COLONOSCOPY WITH PROPOFOL N/A 10/05/2015   Procedure: COLONOSCOPY WITH PROPOFOL;  Surgeon: Lollie Sails, MD;  Location: [facility] ENDOSCOPY;  Service: Endoscopy;  Laterality: N/A;  . ESOPHAGOGASTRODUODENOSCOPY (EGD) WITH PROPOFOL N/A 04/01/2018   Procedure: ESOPHAGOGASTRODUODENOSCOPY (EGD) WITH PROPOFOL;  Surgeon: Lollie Sails, MD;  Location: [facility] ENDOSCOPY;  Service: Endoscopy;  Laterality: N/A;  . HERNIA REPAIR    . JOINT REPLACEMENT Bilateral    hips  RT+  LEFT X2   . LUMBAR LAMINECTOMY/DECOMPRESSION MICRODISCECTOMY Left 09/13/2016   Procedure: Microdiscectomy - Lumbar two-three,  Lumbar three- - left;  Surgeon: Kary Kos, MD;  Location: [place];  Service: Neurosurgery;  Laterality: Left;  . SPINAL CORD STIMULATOR INSERTION  07/08/2019  . TONSILLECTOMY      There were no vitals filed for this visit.    Subjective Assessment - 01/01/20 1258    Subjective Pt is a 79 y.o. male referred to  PT for LBP. Pt has extensive PMH of failed back surgical syndrome, postlaminectomy syndrome, chronic radicular pain, hx of lumbar fusion spine, spinal cord stimulator, paroxysmal A-fib, DM-Type 2, HTN, chronic kidney disease. Pt reports 4 fusions on lumbar spine due to protruding disks which was ~2 years ago. Pt reports that his spine stimulator has started to help some but his biggest issue is his decreased strength in his spine, legs, and core. In sitting pt has 0/10 NPS. With household distance pt has 4-5/10 NPS but is instantly relieved with sitting. With long walks, from the clinic to the car (100 yds)  8-9/10 NPS. The pain is described as dull pain. Pt denies radicular  symptoms. [name] LBP is located acros his belt line right above his buttocks. Pt reports difficulty with his balance and asc/desc stairs but at home he uses no AD at home. Pt does report a fall 1.5 months ago with his cane and fell forward onto his head where he reports he could not catch himself going forward where he hit his head. This is his only fall with no injury. [name] big goal with PT is to be able to indep replace your deck.    How long can you sit comfortably? unlimited    How long can you stand comfortably? 4-5 min    How long can you walk comfortably? 4-5 min    Patient Stated Goals Indep rebuild deck.    Currently in Pain? Yes    Pain Score 7     Pain Location Back    Pain Orientation Lower    Pain Descriptors / Indicators Dull    Pain Type Chronic pain    Pain Onset More than a month ago    Pain Frequency Intermittent              OBJECTIVE  Mental Status Patient's fund of knowledge is within normal limits for educational level.  SENSATION: Grossly intact to light touch bilateral LEs as determined by testing dermatomes L2-S2 Proprioception and hot/cold testing deferred on this date   MUSCULOSKELETAL: Tremor: None Bulk: Normal Tone: Normal No visible step-off along spinal column  Posture Lumbar lordosis: Decreased Iliac crest height: equal bilaterally Increased forward flexion  Gait Decreased gait speed with single LSC use. Decreased hip extension and variable step lengths and step through pattern.    Palpation  TTP along B glut med and lumbar paraspinals at L5-S1 region.   Strength (out of 5) R/L 4/4 Hip flexion 4/4 Hip abduction 4/4 Hip adduction (seated) 5/5 Knee extension 5/5 Knee flexion 5/5 Ankle dorsiflexion 5/5 Ankle plantarflexion   *Indicates pain   AROM (degrees) R/L (all movements include overpressure unless otherwise stated) Lumbar forward flexion (65): 30 Lumbar extension (30): 5 Hip Flexion (0-125): Up 90 deg actively  bilaterally *Indicates pain   Muscle Length Hamstrings: R: 81 degrees L: 82 degrees    Passive Accessory Intervertebral Motion (PAIVM) Not tested due to spinal stimulator.   SPECIAL TESTS Lumbar Radiculopathy and Discogenic: Slump (SN 83, -LR 0.32): R: negative L: negative   Functional Tasks Squatting: Able to squat with increased thoracic flexion  Sit to stand: Requires BUE support.   Outcome Measures:   FOTO: Perform next session   5xSTS with BUE support: 27 sec  2 MWT: 175' no AD. Began shuffling gait at end of test due to muscular fatigue.       Objective measurements completed on examination: See above findings.    PT Education - 01/01/20 1355  Education Details See HEP    Person(s) Educated Patient    Methods Explanation;Demonstration;Handout    Comprehension Verbalized understanding;Returned demonstration               PT Long Term Goals - 01/01/20 1552      PT LONG TERM GOAL #1   Title Pt will improve FOTO score to target goal to demonstrate clinically significant improvement in functional mobility.    Baseline 11/18: Perform next session    Time 4    Period Weeks    Status New    Target Date 01/29/20      PT LONG TERM GOAL #2   Title Pt will improve 5xSTS score by at least 5 sec with no UE support to demonstrate clinically significant improvement in BLE strength.    Baseline 11/18: 27 sec with BUE suppport    Time 4    Period Weeks    Status New    Target Date 01/29/20      PT LONG TERM GOAL #3   Title Pt will improve 2 MWT with no AD by 40' to demonstrate clinically significant improvement in endurance to improve household and community ambulation.    Baseline 11/18: 175' no AD.    Time 4    Period Weeks    Status New    Target Date 01/29/20      PT LONG TERM GOAL #4   Title Pt will ambulate with LRAD with normalized gait pattern > 5 min to reduce tripping hazard with shuffling gait.    Baseline 11/18: Pt amb with single LSC and  shuffling gait after 2 min due to fatigue,    Time 4    Period Weeks    Status New    Target Date 01/29/20                  Plan - 01/01/20 1411    Clinical Impression Statement Pt is a pleasant 79 y.o. male referred to PT for LBP and failed back surgical syndrome. Pt has significant PMH of prior laminectomies, a-fib,  DM-Type 2, HTN, kidney failure. Pt presents with overall flexed posture in sititng and standing with kyphosis and decreased lumbar lordosis and ambualtes wiith single LSC with a decreased gait speed and step lengths with limited hip extensio with shuflfing of gait after 2 min of wlakiung due to fatigue. Lumbar AROM measured at 30 deg flexion and 10 deg extension.  Slump test negative bilaterally with hamstring mobility measured at R hamstring flexibility at 81 deg and L hamstring flexibility at 82 deg. Pt presents with decreased muscle strength via MMT with 4/5 hip flexion, abduction, adduction (in seated), but 5/5 MMT with knee exten and flexion and DF/PF at foot. PAIVM deferred due to spinal stimulator. 5xSTS time in 27 sec with BUE support indicative of decreased functional LE strength for age norms. Pt perfromed 2 MWT with no AD with CGA+1 performed in 175'. Pt can not amb longer than 4-5 min without onset of significant LBP rated at 5-6/10 NPS with shuffling gait and LE shaking due to weakness and fatigue. This limits pt in his ability to safely perform community level amb tasks and places him at risk of falls at home and in community with his big goal to have enough LE strength to rebuild his deck at home indep. Pt can continue to benefit from skilled PT treatment to improve functional mobility.    Personal Factors and Comorbidities Age;Comorbidity 3+;Education;Past/Current Experience;Time since onset of  injury/illness/exacerbation    Comorbidities spinal cord stimulator, paroxysmal A-fib, DM-Type 2, HTN, chronic kidney disease.    Examination-Activity Limitations Bed  Mobility;Stand;Locomotion Level;Toileting;Bend;Reach Overhead;Carry;Squat;Hygiene/Grooming;Stairs    Examination-Participation Restrictions Occupation;Community Activity;Yard Work    Stability/Clinical Decision Making Unstable/Unpredictable    Surveyor, mining    Rehab Potential Fair    PT Frequency 2x / week    PT Duration 4 weeks    PT Treatment/Interventions ADLs/Self Care Home Management;Moist Heat;Gait training;Stair training;Therapeutic activities;Therapeutic exercise;Balance training;Neuromuscular re-education;Patient/family education;Manual techniques;Functional mobility training;Cryotherapy    PT Next Visit Plan Reassess HEP, look at pt's ability to asc/desc stairs. Improve LE strength and endurance.    PT Home Exercise Plan spinal cord stimulator, paroxysmal A-fib, DM-Type 2, HTN, chronic kidney disease.    Consulted and Agree with Plan of Care Patient           Patient will benefit from skilled therapeutic intervention in order to improve the following deficits and impairments:  Abnormal gait, Pain, Improper body mechanics, Decreased mobility, Postural dysfunction, Decreased activity tolerance, Decreased endurance, Decreased range of motion, Decreased strength, Decreased balance, Difficulty walking, Impaired flexibility  Visit Diagnosis: Chronic bilateral low back pain without sciatica  Other abnormalities of gait and mobility  Muscle weakness (generalized)     Problem List Patient Active Problem List   Diagnosis Date Noted  . Spinal cord stimulator status 12/11/2019  . History of 2019 novel coronavirus disease (COVID-19) 10/02/2019  . Urinary retention 08/01/2019  . CKD (chronic kidney disease) stage 3, GFR 30-59 ml/min (HCC) 01/19/2019  . Acquired thrombophilia ([place]) 01/19/2019  . 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 Postlaminectomy syndrome, lumbar region 01/16/2019  . History of fusion of lumbar spine (L2-L5) 01/16/2019  . Chronic radicular  lumbar pain 01/16/2019  . HNP (herniated nucleus pulposus), lumbar 04/29/2018  . Advanced care planning/counseling discussion 09/28/2016  . Spinal stenosis, lumbar region, 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/13/2016  . Hyperlipidemia associated with type 2 diabetes mellitus ([place]) 07/14/2015  . Anemia 06/30/2015  . 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 OSA (obstructive sleep apnea) 03/23/2015  . Type 2 diabetes mellitus with diabetic chronic kidney disease ([place]) 01/05/2015  . Hypertension associated with diabetes ([place]) 09/28/2014  . 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[place]) 09/28/2014  . Arthritis 10/20/2011  . H/O prior ablation treatment 10/19/2011  . Paroxysmal atrial fibrillation (HCC) 10/19/2011   Pura Spice, PT, DPT # [id number] SPT 01/02/2020, 10:11 AM  [facility] [address] [place], [place], [address] Phone: [phone]   Fax:  [phone]  Name: Sean Reyes. MRN: [id number] Date of Birth: Aug 31, 1940

## 2020-01-01 NOTE — Patient Instructions (Signed)
Access Code: [email or web address] URL: [email or web address] Date: 01/01/2020 Prepared by: Dorcas Carrow  Exercises Supine Transversus Abdominis Bracing - Hands on Stomach - 1 x daily - 7 x weekly - 1 sets - 10 reps Supine March - 1 x daily - 7 x weekly - 1 sets - 20 reps Standing Hip Abduction with Counter Support - 1 x daily - 7 x weekly - 1 sets - 20 reps Standing March with Counter Support - 1 x daily - 7 x weekly - 1 sets - 20 reps Heel rises with counter support - 1 x daily - 7 x weekly - 1 sets - 10 reps Proper Sit to Stand Technique - 1 x daily - 7 x weekly - 1 sets - 10 reps

## 2020-01-02 NOTE — Patient Instructions (Addendum)
Pt is a 79 y.o. male referred to PT for LBP. Pt has extensive PMH of failed back surgical syndrome, postlaminectomy syndrome, chronic radicular pain, hx of lumbar fusion spine, spinal cord stimulator, paroxysmal A-fib, DM-Type 2, HTN, chronic kidney disease. Pt reports 4 fusions on lumbar spine due to protruding disks which was ~2 years ago. Pt reports that his spine stimulator has started to help some but his biggest issue is his decreased strength in his spine, legs, and core. In sitting pt has 0/10 NPS. With household distance pt has 4-5/10 NPS but is instantly relieved with sitting. With long walks, from the clinic to the car (100 yds)  8-9/10 NPS. The pain is described as dull pain. Pt denies radicular symptoms. [name] LBP is located acros his belt line right above his buttocks. Pt reports difficulty with his balance and asc/desc stairs but at home he uses no AD at home. Pt does report a fall 1.5 months ago with his cane and fell forward onto his head where he reports he could not catch himself going forward where he hit his head. This is his only fall with no injury. [name] big goal with PT is to be able to indep replace your deck.   Pt is a pleasant 79 y.o. male referred to PT for LBP and failed back surgical syndrome. Pt has significant PMH of prior laminectomies, a-fib,  DM-Type 2, HTN, kidney failure. Pt presents with overall flexed posture in sititng and standing with kyphosis and decreased lumbar lordosis and ambualtes wiith single LSC with a decreased gait speed and step lengths with limited hip extensio with shuflfing of gait after 2 min of wlakiung due to fatigue. Lumbar AROM measured at 30 deg flexion and 10 deg extension.  Slump test negative bilaterally with hamstring mobility measured at R hamstring flexibility at 81 deg and L hamstring flexibility at 82 deg. Pt presents with decreased muscle strength via MMT with 4/5 hip flexion, abduction, adduction (in seated), but 5/5 MMT with knee exten and  flexion and DF/PF at foot. PAIVM deferred due to spinal stimulator. 5xSTS time in 27 sec with BUE support indicative of decreased functional LE strength for age norms. Pt perfromed 2 MWT with no AD with CGA+1 performed in 175'. Pt can not amb longer than 4-5 min without onset of significant LBP rated at 5-6/10 NPS with shuffling gait and LE shaking due to weakness and fatigue. This limits pt in his ability to safely perform community level amb tasks and places him at risk of falls at home and in community with his big goal to have enough LE strength to rebuild his deck at home indep. Pt can continue to benefit from skilled PT treatment to improve functional mobility.

## 2020-01-06 ENCOUNTER — Ambulatory Visit: Payer: Medicare Other | Admitting: Physical Therapy

## 2020-01-06 ENCOUNTER — Ambulatory Visit: Payer: Medicare Other

## 2020-01-06 ENCOUNTER — Other Ambulatory Visit: Payer: Self-pay

## 2020-01-06 DIAGNOSIS — G8929 Other chronic pain: Secondary | ICD-10-CM | POA: Diagnosis not present

## 2020-01-06 DIAGNOSIS — R2689 Other abnormalities of gait and mobility: Secondary | ICD-10-CM

## 2020-01-06 DIAGNOSIS — M6281 Muscle weakness (generalized): Secondary | ICD-10-CM | POA: Diagnosis not present

## 2020-01-06 DIAGNOSIS — M545 Low back pain, unspecified: Secondary | ICD-10-CM | POA: Diagnosis not present

## 2020-01-06 NOTE — Therapy (Signed)
[facility] [address] [place], [place], [address] Phone: [phone]   Fax:  [phone]  Physical Therapy Treatment  Patient Details  Name: Sean Reyes. MRN: [id number] Date of Birth: 09/11/1940 Referring Provider (PT): Gillis Santa, MD   Encounter Date: 01/06/2020   PT End of Session - 01/06/20 1306    Visit Number 2    Number of Visits 9    Date for PT Re-Evaluation 01/29/20    Authorization Type eval: 01/01/20    PT Start Time 1310    PT Stop Time 1350    PT Time Calculation (min) 40 min    Activity Tolerance Patient tolerated treatment well;Patient limited by fatigue    Behavior During Therapy [facility] for tasks assessed/performed           Past Medical History:  Diagnosis Date  . Anemia   . Anxiety   . Arthritis   . Atrial fibrillation ([place])   . Cataracts, bilateral   . Complication of anesthesia    pt reports low BP's after surgery at [facility] and difficulty awakening  . Depression   . Diabetes ([place])    dx 6-8 yrs ago  . Dysrhythmia    a-fib  . GERD (gastroesophageal reflux disease)    OCC TAKES ALKA SELTZER  . History of kidney stones    10-15 yrs ago  . 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 Hyperlipidemia   . Hypertension   . Nocturia   . S/P ablation of atrial fibrillation    Ablative therapy  . Sleep apnea    CPAP   . Tachycardia, unspecified     Past Surgical History:  Procedure Laterality Date  . ABLATION    . ANTERIOR LAT LUMBAR FUSION N/A 06/27/2017   Procedure: Anterior Lateral Lumbar Interbody  Fusion - Lumbar Two-Lumbar Three - Lumbar Three-Lumbar Four, Posterior Lumbar Interbody Fusion Lumbar Four- Five;  Surgeon: Kary Kos, MD;  Location: [place];  Service: Neurosurgery;  Laterality: N/A;  Anterior Lateral Lumbar Interbody  Fusion - Lumbar Two-Lumbar Three - Lumbar Three-Lumbar Four, Posterior Lumbar Interbody Fusion Lumbar Four- Five  . BACK SURGERY    .  CARDIOVERSION N/A 08/29/2018   Procedure: CARDIOVERSION;  Surgeon: Corey Skains, MD;  Location: [facility];  Service: Cardiovascular;  Laterality: N/A;  . CARDIOVERSION N/A 09/24/2018   Procedure: CARDIOVERSION;  Surgeon: Corey Skains, MD;  Location: [facility];  Service: Cardiovascular;  Laterality: N/A;  . COLONOSCOPY WITH PROPOFOL N/A 10/05/2015   Procedure: COLONOSCOPY WITH PROPOFOL;  Surgeon: Lollie Sails, MD;  Location: [facility] ENDOSCOPY;  Service: Endoscopy;  Laterality: N/A;  . ESOPHAGOGASTRODUODENOSCOPY (EGD) WITH PROPOFOL N/A 04/01/2018   Procedure: ESOPHAGOGASTRODUODENOSCOPY (EGD) WITH PROPOFOL;  Surgeon: Lollie Sails, MD;  Location: [facility] ENDOSCOPY;  Service: Endoscopy;  Laterality: N/A;  . HERNIA REPAIR    . JOINT REPLACEMENT Bilateral    hips  RT+  LEFT X2   . LUMBAR LAMINECTOMY/DECOMPRESSION MICRODISCECTOMY Left 09/13/2016   Procedure: Microdiscectomy - Lumbar two-three,  Lumbar three- - left;  Surgeon: Kary Kos, MD;  Location: [place];  Service: Neurosurgery;  Laterality: Left;  . SPINAL CORD STIMULATOR INSERTION  07/08/2019  . TONSILLECTOMY      There were no vitals filed for this visit.   Subjective Assessment - 01/06/20 1303    Subjective Pt reports that he is doing alright today. His back pain is unchanged since the initial evaluation and denies any  pain upon arrival today. No specific questions or concerns currently.    Pertinent History Pt is a 79 y.o. male referred to PT for LBP. Pt has extensive PMH of failed back surgical syndrome, postlaminectomy syndrome, chronic radicular pain, hx of lumbar fusion spine, spinal cord stimulator, paroxysmal A-fib, DM-Type 2, HTN, chronic kidney disease. Pt reports 4 fusions on lumbar spine due to protruding disks which was ~2 years ago. Pt reports that his spine stimulator has started to help some but his biggest issue is his decreased strength in his spine, legs, and core. In sitting pt has 0/10 NPS. With household distance pt  has 4-5/10 NPS but is instantly relieved with sitting. With long walks, from the clinic to the car (100 yds)  8-9/10 NPS. The pain is described as dull pain. Pt denies radicular symptoms. [name] LBP is located acros his belt line right above his buttocks. Pt reports difficulty with his balance and asc/desc stairs but at home he uses no AD at home. Pt does report a fall 1.5 months ago with his cane and fell forward onto his head where he reports he could not catch himself going forward where he hit his head. This is his only fall with no injury. [name] big goal with PT is to be able to indep replace your deck.    How long can you sit comfortably? unlimited    How long can you stand comfortably? 4-5 min    How long can you walk comfortably? 4-5 min    Patient Stated Goals Indep rebuild deck.    Currently in Pain? No/denies    Pain Location --    Pain Orientation --    Pain Descriptors / Indicators --    Pain Type --    Pain Onset --    Pain Frequency --               TREATMENT   Ther-ex  NuStep L3 x 5 minutes for warm-up during history BLE only, seat position 13 (2 minutes unbilled); Hooklying marches x 10, tried to coordinate exhale for TrA contraction however pt struggles to perform; Hooklying SLR x 10, tried to coordinate exhale for TrA contraction however pt struggles to perform; Hooklying clams x 10 BLE, with manual resistance; Hooklying adductor squeeze 2 x 10 BLE, with manual resistance; Sidelying straight leg hip abduction x 10 BLE;  Sidelying reverse clams x 10 BLE; Sit to stand from elevated mat table without UE support x 10, lowered mat table and performed an additional 10; Seated marches with manual resistance from therapist x 10; Seated heel raises with manual resistance from therapist; [place] stretch, stopped at 90 degrees due to history of bilateral posterior hip replacements x 30s BLE; Hip IR/ER stretch x 30s each bilaterally, performed below 90 degrees due to bilateral  posterior approach hip replacements; HS stretch x 30s BLE;    Pt educated throughout session about proper posture and technique with exercises. Improved exercise technique, movement at target joints, use of target muscles after min to mod verbal, visual, tactile cues.    Patient demonstrates good motivation during session today.  Denies any increase in back pain with exercises.  Left lower extremity is notably more weak than right lower extremity during supine hook lying side-lying exercises.  Gentle hip stretches performed to help minimize low back pain.  Patient encouraged to continue HEP and follow-up as scheduled.  Will review and modify as needed at next session. Pt will benefit from PT services to address  deficits in strength and pain in order to return to full function at home.                          PT Long Term Goals - 01/06/20 1414      PT LONG TERM GOAL #1   Title Pt will improve FOTO score to target goal (49) to demonstrate clinically significant improvement in functional mobility.    Baseline 11/18: Perform next session; 01/06/20: 38    Time 4    Period Weeks    Status New    Target Date 01/29/20      PT LONG TERM GOAL #2   Title Pt will improve 5xSTS score by at least 5 sec with no UE support to demonstrate clinically significant improvement in BLE strength.    Baseline 11/18: 27 sec with BUE suppport    Time 4    Period Weeks    Status New    Target Date 01/29/20      PT LONG TERM GOAL #3   Title Pt will improve 2 MWT with no AD by 40' to demonstrate clinically significant improvement in endurance to improve household and community ambulation.    Baseline 11/18: 175' no AD.    Time 4    Period Weeks    Status New    Target Date 01/29/20      PT LONG TERM GOAL #4   Title Pt will ambulate with LRAD with normalized gait pattern > 5 min to reduce tripping hazard with shuffling gait.    Baseline 11/18: Pt amb with single LSC and shuffling gait  after 2 min due to fatigue,    Time 4    Period Weeks    Status New    Target Date 01/29/20                 Plan - 01/06/20 1306    Clinical Impression Statement Patient demonstrates good motivation during session today.  Denies any increase in back pain with exercises.  Left lower extremity is notably more weak than right lower extremity during supine hook lying side-lying exercises.  Gentle hip stretches performed to help minimize low back pain.  Patient encouraged to continue HEP and follow-up as scheduled.  Will review and modify as needed at next session. Pt will benefit from PT services to address deficits in strength and pain in order to return to full function at home.    Personal Factors and Comorbidities Age;Comorbidity 3+;Education;Past/Current Experience;Time since onset of injury/illness/exacerbation    Comorbidities spinal cord stimulator, paroxysmal A-fib, DM-Type 2, HTN, chronic kidney disease.    Examination-Activity Limitations Bed Mobility;Stand;Locomotion Level;Toileting;Bend;Reach Overhead;Carry;Squat;Hygiene/Grooming;Stairs    Examination-Participation Restrictions Occupation;Community Activity;Yard Work    Stability/Clinical Decision Making Unstable/Unpredictable    Rehab Potential Fair    PT Frequency 2x / week    PT Duration 4 weeks    PT Treatment/Interventions ADLs/Self Care Home Management;Moist Heat;Gait training;Stair training;Therapeutic activities;Therapeutic exercise;Balance training;Neuromuscular re-education;Patient/family education;Manual techniques;Functional mobility training;Cryotherapy    PT Next Visit Plan Reassess HEP, look at pt's ability to asc/desc stairs. Improve LE strength and endurance.    PT Home Exercise Plan spinal cord stimulator, paroxysmal A-fib, DM-Type 2, HTN, chronic kidney disease.    Consulted and Agree with Plan of Care Patient           Patient will benefit from skilled therapeutic intervention in order to improve the  following deficits and impairments:  Abnormal gait, Pain, Improper  body mechanics, Decreased mobility, Postural dysfunction, Decreased activity tolerance, Decreased endurance, Decreased range of motion, Decreased strength, Decreased balance, Difficulty walking, Impaired flexibility  Visit Diagnosis: Chronic bilateral low back pain without sciatica  Other abnormalities of gait and mobility     Problem List Patient Active Problem List   Diagnosis Date Noted  . Spinal cord stimulator status 12/11/2019  . History of 2019 novel coronavirus disease (COVID-19) 10/02/2019  . Urinary retention 08/01/2019  . CKD (chronic kidney disease) stage 3, GFR 30-59 ml/min (HCC) 01/19/2019  . Acquired thrombophilia ([place]) 01/19/2019  . 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 Postlaminectomy syndrome, lumbar region 01/16/2019  . History of fusion of lumbar spine (L2-L5) 01/16/2019  . Chronic radicular lumbar pain 01/16/2019  . HNP (herniated nucleus pulposus), lumbar 04/29/2018  . Advanced care planning/counseling discussion 09/28/2016  . Spinal stenosis, lumbar region, 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/13/2016  . Hyperlipidemia associated with type 2 diabetes mellitus ([place]) 07/14/2015  . Anemia 06/30/2015  . 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 OSA (obstructive sleep apnea) 03/23/2015  . Type 2 diabetes mellitus with diabetic chronic kidney disease ([place]) 01/05/2015  . Hypertension associated with diabetes ([place]) 09/28/2014  . 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[place]) 09/28/2014  . Arthritis 10/20/2011  . H/O prior ablation treatment 10/19/2011  . Paroxysmal atrial fibrillation (HCC) 10/19/2011   Phillips Grout PT, DPT, GCS  Corryn Madewell 01/06/2020, 2:15 PM  [facility] [address] [place], [place], [address] Phone: [phone]   Fax:  [phone]  Name: Sean Reyes. MRN: [id number] Date of Birth:  [birth year]

## 2020-01-13 ENCOUNTER — Ambulatory Visit: Payer: Medicare Other

## 2020-01-13 ENCOUNTER — Other Ambulatory Visit: Payer: Self-pay

## 2020-01-13 ENCOUNTER — Encounter: Payer: Medicare Other | Admitting: Physical Therapy

## 2020-01-13 DIAGNOSIS — G8929 Other chronic pain: Secondary | ICD-10-CM

## 2020-01-13 DIAGNOSIS — M545 Low back pain, unspecified: Secondary | ICD-10-CM

## 2020-01-13 DIAGNOSIS — M6281 Muscle weakness (generalized): Secondary | ICD-10-CM | POA: Diagnosis not present

## 2020-01-13 DIAGNOSIS — R2689 Other abnormalities of gait and mobility: Secondary | ICD-10-CM | POA: Diagnosis not present

## 2020-01-13 NOTE — Therapy (Signed)
[facility] [address] [place], [place], [address] Phone: [phone]   Fax:  [phone]  Physical Therapy Treatment  Patient Details  Name: Sean Reyes. MRN: [id number] Date of Birth: 09/26/1940 Referring Provider (PT): Gillis Santa, MD   Encounter Date: 01/13/2020   PT End of Session - 01/13/20 1604    Visit Number 3    Number of Visits 9    Date for PT Re-Evaluation 01/29/20    Authorization Type eval: 01/01/20    PT Start Time 1302    PT Stop Time 1345    PT Time Calculation (min) 43 min    Activity Tolerance Patient tolerated treatment well;Patient limited by fatigue    Behavior During Therapy [facility] for tasks assessed/performed           Past Medical History:  Diagnosis Date  . Anemia   . Anxiety   . Arthritis   . Atrial fibrillation ([place])   . Cataracts, bilateral   . Complication of anesthesia    pt reports low BP's after surgery at [facility] and difficulty awakening  . Depression   . Diabetes ([place])    dx 6-8 yrs ago  . Dysrhythmia    a-fib  . GERD (gastroesophageal reflux disease)    OCC TAKES ALKA SELTZER  . History of kidney stones    10-15 yrs ago  . 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 Hyperlipidemia   . Hypertension   . Nocturia   . S/P ablation of atrial fibrillation    Ablative therapy  . Sleep apnea    CPAP   . Tachycardia, unspecified     Past Surgical History:  Procedure Laterality Date  . ABLATION    . ANTERIOR LAT LUMBAR FUSION N/A 06/27/2017   Procedure: Anterior Lateral Lumbar Interbody  Fusion - Lumbar Two-Lumbar Three - Lumbar Three-Lumbar Four, Posterior Lumbar Interbody Fusion Lumbar Four- Five;  Surgeon: Kary Kos, MD;  Location: [place];  Service: Neurosurgery;  Laterality: N/A;  Anterior Lateral Lumbar Interbody  Fusion - Lumbar Two-Lumbar Three - Lumbar Three-Lumbar Four, Posterior Lumbar Interbody Fusion Lumbar Four- Five  . BACK SURGERY    .  CARDIOVERSION N/A 08/29/2018   Procedure: CARDIOVERSION;  Surgeon: Corey Skains, MD;  Location: [facility];  Service: Cardiovascular;  Laterality: N/A;  . CARDIOVERSION N/A 09/24/2018   Procedure: CARDIOVERSION;  Surgeon: Corey Skains, MD;  Location: [facility];  Service: Cardiovascular;  Laterality: N/A;  . COLONOSCOPY WITH PROPOFOL N/A 10/05/2015   Procedure: COLONOSCOPY WITH PROPOFOL;  Surgeon: Lollie Sails, MD;  Location: [facility] ENDOSCOPY;  Service: Endoscopy;  Laterality: N/A;  . ESOPHAGOGASTRODUODENOSCOPY (EGD) WITH PROPOFOL N/A 04/01/2018   Procedure: ESOPHAGOGASTRODUODENOSCOPY (EGD) WITH PROPOFOL;  Surgeon: Lollie Sails, MD;  Location: [facility] ENDOSCOPY;  Service: Endoscopy;  Laterality: N/A;  . HERNIA REPAIR    . JOINT REPLACEMENT Bilateral    hips  RT+  LEFT X2   . LUMBAR LAMINECTOMY/DECOMPRESSION MICRODISCECTOMY Left 09/13/2016   Procedure: Microdiscectomy - Lumbar two-three,  Lumbar three- - left;  Surgeon: Kary Kos, MD;  Location: [place];  Service: Neurosurgery;  Laterality: Left;  . SPINAL CORD STIMULATOR INSERTION  07/08/2019  . TONSILLECTOMY      There were no vitals filed for this visit.   Subjective Assessment - 01/13/20 1306    Subjective Pt reports that he is doing alright today. His back pain is unchanged since the last therapy session and he  rates it as a 5/10 upon arrival today. No specific questions or concerns currently.    Pertinent History Pt is a 79 y.o. male referred to PT for LBP. Pt has extensive PMH of failed back surgical syndrome, postlaminectomy syndrome, chronic radicular pain, hx of lumbar fusion spine, spinal cord stimulator, paroxysmal A-fib, DM-Type 2, HTN, chronic kidney disease. Pt reports 4 fusions on lumbar spine due to protruding disks which was ~2 years ago. Pt reports that his spine stimulator has started to help some but his biggest issue is his decreased strength in his spine, legs, and core. In sitting pt has 0/10 NPS. With household  distance pt has 4-5/10 NPS but is instantly relieved with sitting. With long walks, from the clinic to the car (100 yds)  8-9/10 NPS. The pain is described as dull pain. Pt denies radicular symptoms. [name] LBP is located acros his belt line right above his buttocks. Pt reports difficulty with his balance and asc/desc stairs but at home he uses no AD at home. Pt does report a fall 1.5 months ago with his cane and fell forward onto his head where he reports he could not catch himself going forward where he hit his head. This is his only fall with no injury. [name] big goal with PT is to be able to indep replace your deck.    How long can you sit comfortably? unlimited    How long can you stand comfortably? 4-5 min    How long can you walk comfortably? 4-5 min    Patient Stated Goals Indep rebuild deck.    Currently in Pain? Yes    Pain Score 5     Pain Location Back    Pain Orientation Lower                 TREATMENT   Ther-ex  SciFit L3 x 5 minutes for warm-up during history (2 minutes unbilled); Hooklying marches 2 x 10, tried to coordinate exhale for TrA contraction however pt struggles to perform; Hooklying SLR x 10, tried to coordinate exhale for TrA contraction however pt struggles to perform; Hooklying clams 2 x 10 BLE, with manual resistance; Hooklying adductor squeeze 2 x 10 BLE, with manual resistance; Supine straight leg hip abduction and adduction with manual resistance form therapist x 10 BLE each;  Sidelying reverse clams 2 x 10 BLE; Sit to stand from regular height chair with Airex pad on seat and without UE support 2 x 10, initially attempted from regular height chair however pt is unable to perform; Mini Squats x 10 with BUE support on mat table; Standing hip flexion marches with 1.5# ankle weights x 10 BLE; Standing hip abduction with 1.5# ankle weights x 10 BLE;    Pt educated throughout session about proper posture and technique with exercises. Improved exercise  technique, movement at target joints, use of target muscles after min to mod verbal, visual, tactile cues.    Patient demonstrates good motivation during session today.  Denies any increase in back pain with exercises. Continued with supine and sidelying exercises but also added additional standing exercises today. Patient encouraged to continue HEP and follow-up as scheduled.  Will review and modify as needed at next session. Pt will benefit from PT services to address deficits in strength and pain in order to return to full function at home.  PT Long Term Goals - 01/06/20 1414      PT LONG TERM GOAL #1   Title Pt will improve FOTO score to target goal (49) to demonstrate clinically significant improvement in functional mobility.    Baseline 11/18: Perform next session; 01/06/20: 38    Time 4    Period Weeks    Status New    Target Date 01/29/20      PT LONG TERM GOAL #2   Title Pt will improve 5xSTS score by at least 5 sec with no UE support to demonstrate clinically significant improvement in BLE strength.    Baseline 11/18: 27 sec with BUE suppport    Time 4    Period Weeks    Status New    Target Date 01/29/20      PT LONG TERM GOAL #3   Title Pt will improve 2 MWT with no AD by 40' to demonstrate clinically significant improvement in endurance to improve household and community ambulation.    Baseline 11/18: 175' no AD.    Time 4    Period Weeks    Status New    Target Date 01/29/20      PT LONG TERM GOAL #4   Title Pt will ambulate with LRAD with normalized gait pattern > 5 min to reduce tripping hazard with shuffling gait.    Baseline 11/18: Pt amb with single LSC and shuffling gait after 2 min due to fatigue,    Time 4    Period Weeks    Status New    Target Date 01/29/20                 Plan - 01/13/20 1604    Clinical Impression Statement Patient demonstrates good motivation during session today.   Denies any increase in back pain with exercises. Continued with supine and sidelying exercises but also added additional standing exercises today. Patient encouraged to continue HEP and follow-up as scheduled.  Will review and modify as needed at next session. Pt will benefit from PT services to address deficits in strength and pain in order to return to full function at home.    Personal Factors and Comorbidities Age;Comorbidity 3+;Education;Past/Current Experience;Time since onset of injury/illness/exacerbation    Comorbidities spinal cord stimulator, paroxysmal A-fib, DM-Type 2, HTN, chronic kidney disease.    Examination-Activity Limitations Bed Mobility;Stand;Locomotion Level;Toileting;Bend;Reach Overhead;Carry;Squat;Hygiene/Grooming;Stairs    Examination-Participation Restrictions Occupation;Community Activity;Yard Work    Stability/Clinical Decision Making Unstable/Unpredictable    Rehab Potential Fair    PT Frequency 2x / week    PT Duration 4 weeks    PT Treatment/Interventions ADLs/Self Care Home Management;Moist Heat;Gait training;Stair training;Therapeutic activities;Therapeutic exercise;Balance training;Neuromuscular re-education;Patient/family education;Manual techniques;Functional mobility training;Cryotherapy    PT Next Visit Plan Reassess HEP, look at pt's ability to asc/desc stairs. Improve LE strength and endurance.    PT Home Exercise Plan spinal cord stimulator, paroxysmal A-fib, DM-Type 2, HTN, chronic kidney disease.    Consulted and Agree with Plan of Care Patient           Patient will benefit from skilled therapeutic intervention in order to improve the following deficits and impairments:  Abnormal gait, Pain, Improper body mechanics, Decreased mobility, Postural dysfunction, Decreased activity tolerance, Decreased endurance, Decreased range of motion, Decreased strength, Decreased balance, Difficulty walking, Impaired flexibility  Visit Diagnosis: Chronic bilateral low  back pain without sciatica  Other abnormalities of gait and mobility  Muscle weakness (generalized)     Problem List Patient Active Problem List  Diagnosis Date Noted  . Spinal cord stimulator status 12/11/2019  . History of 2019 novel coronavirus disease (COVID-19) 10/02/2019  . Urinary retention 08/01/2019  . CKD (chronic kidney disease) stage 3, GFR 30-59 ml/min (HCC) 01/19/2019  . Acquired thrombophilia ([place]) 01/19/2019  . 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 Postlaminectomy syndrome, lumbar region 01/16/2019  . History of fusion of lumbar spine (L2-L5) 01/16/2019  . Chronic radicular lumbar pain 01/16/2019  . HNP (herniated nucleus pulposus), lumbar 04/29/2018  . Advanced care planning/counseling discussion 09/28/2016  . Spinal stenosis, lumbar region, 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/13/2016  . Hyperlipidemia associated with type 2 diabetes mellitus ([place]) 07/14/2015  . Anemia 06/30/2015  . 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 OSA (obstructive sleep apnea) 03/23/2015  . Type 2 diabetes mellitus with diabetic chronic kidney disease ([place]) 01/05/2015  . Hypertension associated with diabetes ([place]) 09/28/2014  . 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[place]) 09/28/2014  . Arthritis 10/20/2011  . H/O prior ablation treatment 10/19/2011  . Paroxysmal atrial fibrillation (HCC) 10/19/2011   Phillips Grout PT, DPT, GCS  Yatzary Merriweather 01/13/2020, 4:09 PM  [facility] [address] [place], [place], [address] Phone: [phone]   Fax:  [phone]  Name: Sean Reyes. MRN: [id number] Date of Birth: [birth year]

## 2020-01-15 ENCOUNTER — Encounter: Payer: Medicare Other | Admitting: Physical Therapy

## 2020-01-16 ENCOUNTER — Ambulatory Visit: Payer: Medicare Other

## 2020-01-20 ENCOUNTER — Encounter: Payer: Medicare Other | Admitting: Physical Therapy

## 2020-01-20 ENCOUNTER — Other Ambulatory Visit: Payer: Self-pay

## 2020-01-20 ENCOUNTER — Ambulatory Visit: Payer: Medicare Other | Attending: Student in an Organized Health Care Education/Training Program

## 2020-01-20 DIAGNOSIS — M6281 Muscle weakness (generalized): Secondary | ICD-10-CM | POA: Diagnosis not present

## 2020-01-20 DIAGNOSIS — M545 Low back pain, unspecified: Secondary | ICD-10-CM | POA: Insufficient documentation

## 2020-01-20 DIAGNOSIS — R2689 Other abnormalities of gait and mobility: Secondary | ICD-10-CM | POA: Insufficient documentation

## 2020-01-20 DIAGNOSIS — G8929 Other chronic pain: Secondary | ICD-10-CM | POA: Insufficient documentation

## 2020-01-20 NOTE — Therapy (Signed)
[facility] [address] [place] [employer], [place], [address] Phone: [phone]   Fax:  [phone]  Physical Therapy Treatment  Patient Details  Name: Sean Reyes. MRN: [id number] Date of Birth: 1940/11/23 Referring Provider (PT): Gillis Santa, MD   Encounter Date: 01/20/2020   PT End of Session - 01/20/20 1303    Visit Number 4    Number of Visits 9    Date for PT Re-Evaluation 01/29/20    Authorization Type eval: 01/01/20    PT Start Time 1300    PT Stop Time 1345    PT Time Calculation (min) 45 min    Activity Tolerance Patient tolerated treatment well;Patient limited by fatigue    Behavior During Therapy [facility] for tasks assessed/performed           Past Medical History:  Diagnosis Date  . Anemia   . Anxiety   . Arthritis   . Atrial fibrillation ([place])   . Cataracts, bilateral   . Complication of anesthesia    pt reports low BP's after surgery at [facility] and difficulty awakening  . Depression   . Diabetes ([place])    dx 6-8 yrs ago  . Dysrhythmia    a-fib  . GERD (gastroesophageal reflux disease)    OCC TAKES ALKA SELTZER  . History of kidney stones    10-15 yrs ago  . 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 Hyperlipidemia   . Hypertension   . Nocturia   . S/P ablation of atrial fibrillation    Ablative therapy  . Sleep apnea    CPAP   . Tachycardia, unspecified     Past Surgical History:  Procedure Laterality Date  . ABLATION    . ANTERIOR LAT LUMBAR FUSION N/A 06/27/2017   Procedure: Anterior Lateral Lumbar Interbody  Fusion - Lumbar Two-Lumbar Three - Lumbar Three-Lumbar Four, Posterior Lumbar Interbody Fusion Lumbar Four- Five;  Surgeon: Kary Kos, MD;  Location: [place];  Service: Neurosurgery;  Laterality: N/A;  Anterior Lateral Lumbar Interbody  Fusion - Lumbar Two-Lumbar Three - Lumbar Three-Lumbar Four, Posterior Lumbar Interbody Fusion Lumbar Four- Five  . BACK SURGERY    .  CARDIOVERSION N/A 08/29/2018   Procedure: CARDIOVERSION;  Surgeon: Corey Skains, MD;  Location: [facility];  Service: Cardiovascular;  Laterality: N/A;  . CARDIOVERSION N/A 09/24/2018   Procedure: CARDIOVERSION;  Surgeon: Corey Skains, MD;  Location: [facility];  Service: Cardiovascular;  Laterality: N/A;  . COLONOSCOPY WITH PROPOFOL N/A 10/05/2015   Procedure: COLONOSCOPY WITH PROPOFOL;  Surgeon: Lollie Sails, MD;  Location: [facility];  Service: Endoscopy;  Laterality: N/A;  . ESOPHAGOGASTRODUODENOSCOPY (EGD) WITH PROPOFOL N/A 04/01/2018   Procedure: ESOPHAGOGASTRODUODENOSCOPY (EGD) WITH PROPOFOL;  Surgeon: Lollie Sails, MD;  Location: [facility] END[facility]COPY;  Service: Endoscopy;  Laterality: N/A;  . HERNIA REPAIR    . JOINT REPLACEMENT Bilateral    hips  RT+  LEFT X2   . LUMBAR LAMINECTOMY/DECOMPRESSION MICRODISCECTOMY Left 09/13/2016   Procedure: Microdiscectomy - Lumbar two-three,  Lumbar three- - left;  Surgeon: Kary Kos, MD;  Location: [place];  Service: Neurosurgery;  Laterality: Left;  . SPINAL CORD STIMULATOR INSERTION  07/08/2019  . TONSILLECTOMY      There were no vitals filed for this visit.   Subjective Assessment - 01/20/20 1302    Subjective Pt reports that he is doing well today. His back has been improving significantly and he denies any back pain  upon arrival today. No specific questions or concerns currently.    Pertinent History Pt is a 79 y.o. male referred to PT for LBP. Pt has extensive PMH of failed back surgical syndrome, postlaminectomy syndrome, chronic radicular pain, hx of lumbar fusion spine, spinal cord stimulator, paroxysmal A-fib, DM-Type 2, HTN, chronic kidney disease. Pt reports 4 fusions on lumbar spine due to protruding disks which was ~2 years ago. Pt reports that his spine stimulator has started to help some but his biggest issue is his decreased strength in his spine, legs, and core. In sitting pt has 0/10 NPS. With household distance pt has 4-5/10  NPS but is instantly relieved with sitting. With long walks, from the clinic to the car (100 yds)  8-9/10 NPS. The pain is described as dull pain. Pt denies radicular symptoms. [name] LBP is located acros his belt line right above his buttocks. Pt reports difficulty with his balance and asc/desc stairs but at home he uses no AD at home. Pt does report a fall 1.5 months ago with his cane and fell forward onto his head where he reports he could not catch himself going forward where he hit his head. This is his only fall with no injury. [name] big goal with PT is to be able to indep replace your deck.    How long can you sit comfortably? unlimited    How long can you stand comfortably? 4-5 min    How long can you walk comfortably? 4-5 min    Patient Stated Goals Indep rebuild deck.    Currently in Pain? No/denies              TREATMENT   Ther-ex  SciFit L5 x 5 minutes for warm-up during history (3 minutes unbilled); Hooklying marches 2 x 15, tried to coordinate exhale for TrA contraction however pt struggles to perform; Hooklying clams 2 x 15 BLE, with manual resistance; Hooklying adductor squeeze 2 x 15 BLE, with manual resistance; Hooklying SLR x 10, x 15 LLE, 2 x 15 RLE; Supine straight leg hip abduction and adduction with manual resistance fromm therapist x 10 BLE each;  Sidelying reverse clams 2 x 15 BLE; Sit to stand from regular height chair with Airex pad on seat and without UE support holding 5# dumbbell, 2 x 10; Standing heel raises with BUE support x 15; Standing hip flexion marches with 3# ankle weights x 15 BLE; Standing hip abduction with 3# ankle weights x 15 BLE; Standing HS curls with 3# ankle weights x 15 BLE; Standing hip extension with 3# ankle weights x 15 BLE;    Pt educated throughout session about proper posture and technique with exercises. Improved exercise technique, movement at target joints, use of target muscles after min to mod verbal, visual, tactile cues.     Patient demonstrates good motivation during session today.  Denies any increase in back pain with exercises but does have some L hip weakness/discomfort during R sidelying exercises. Continued with supine and sidelying exercises but also added additional standing exercises today using ankle weights. Patient encouraged to continue HEP and follow-up as scheduled. Pt will benefit from PT services to address deficits in strength and pain in order to return to full function at home.                                 PT Long Term Goals - 01/06/20 1414  PT LONG TERM GOAL #1   Title Pt will improve FOTO score to target goal (49) to demonstrate clinically significant improvement in functional mobility.    Baseline 11/18: Perform next session; 01/06/20: 38    Time 4    Period Weeks    Status New    Target Date 01/29/20      PT LONG TERM GOAL #2   Title Pt will improve 5xSTS score by at least 5 sec with no UE support to demonstrate clinically significant improvement in BLE strength.    Baseline 11/18: 27 sec with BUE suppport    Time 4    Period Weeks    Status New    Target Date 01/29/20      PT LONG TERM GOAL #3   Title Pt will improve 2 MWT with no AD by 40' to demonstrate clinically significant improvement in endurance to improve household and community ambulation.    Baseline 11/18: 175' no AD.    Time 4    Period Weeks    Status New    Target Date 01/29/20      PT LONG TERM GOAL #4   Title Pt will ambulate with LRAD with normalized gait pattern > 5 min to reduce tripping hazard with shuffling gait.    Baseline 11/18: Pt amb with single LSC and shuffling gait after 2 min due to fatigue,    Time 4    Period Weeks    Status New    Target Date 01/29/20                 Plan - 01/20/20 1303    Clinical Impression Statement Patient demonstrates good motivation during session today.  Denies any increase in back pain with exercises but does have  some L hip weakness/discomfort during R sidelying exercises. Continued with supine and sidelying exercises but also added additional standing exercises today using ankle weights. Patient encouraged to continue HEP and follow-up as scheduled. Pt will benefit from PT services to address deficits in strength and pain in order to return to full function at home.    Personal Factors and Comorbidities Age;Comorbidity 3+;Education;Past/Current Experience;Time since onset of injury/illness/exacerbation    Comorbidities spinal cord stimulator, paroxysmal A-fib, DM-Type 2, HTN, chronic kidney disease.    Examination-Activity Limitations Bed Mobility;Stand;Locomotion Level;Toileting;Bend;Reach Overhead;Carry;Squat;Hygiene/Grooming;Stairs    Examination-Participation Restrictions Occupation;Community Activity;Yard Work    Stability/Clinical Decision Making Unstable/Unpredictable    Rehab Potential Fair    PT Frequency 2x / week    PT Duration 4 weeks    PT Treatment/Interventions ADLs/Self Care Home Management;Moist Heat;Gait training;Stair training;Therapeutic activities;Therapeutic exercise;Balance training;Neuromuscular re-education;Patient/family education;Manual techniques;Functional mobility training;Cryotherapy    PT Next Visit Plan Reassess HEP, look at pt's ability to asc/desc stairs. Improve LE strength and endurance.    PT Home Exercise Plan spinal cord stimulator, paroxysmal A-fib, DM-Type 2, HTN, chronic kidney disease.    Consulted and Agree with Plan of Care Patient           Patient will benefit from skilled therapeutic intervention in order to improve the following deficits and impairments:  Abnormal gait, Pain, Improper body mechanics, Decreased mobility, Postural dysfunction, Decreased activity tolerance, Decreased endurance, Decreased range of motion, Decreased strength, Decreased balance, Difficulty walking, Impaired flexibility  Visit Diagnosis: Chronic bilateral low back pain without  sciatica     Problem List Patient Active Problem List   Diagnosis Date Noted  . Spinal cord stimulator status 12/11/2019  . History of 2019 novel coronavirus disease (COVID-19)  10/02/2019  . Urinary retention 08/01/2019  . CKD (chronic kidney disease) stage 3, GFR 30-59 ml/min (HCC) 01/19/2019  . Acquired thrombophilia ([place]) 01/19/2019  . 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 Postlaminectomy syndrome, lumbar region 01/16/2019  . History of fusion of lumbar spine (L2-L5) 01/16/2019  . Chronic radicular lumbar pain 01/16/2019  . HNP (herniated nucleus pulposus), lumbar 04/29/2018  . Advanced care planning/counseling discussion 09/28/2016  . Spinal stenosis, lumbar region, 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/13/2016  . Hyperlipidemia associated with type 2 diabetes mellitus ([place]) 07/14/2015  . Anemia 06/30/2015  . 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 OSA (obstructive sleep apnea) 03/23/2015  . Type 2 diabetes mellitus with diabetic chronic kidney disease ([place]) 01/05/2015  . Hypertension associated with diabetes ([place]) 09/28/2014  . 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[place]) 09/28/2014  . Arthritis 10/20/2011  . H/O prior ablation treatment 10/19/2011  . Paroxysmal atrial fibrillation ([place]) 10/19/2011   Phillips Grout PT, DPT, GCS  Dawnmarie Breon 01/20/2020, 2:02 PM  [facility] [address] [place], [place], [address] Phone: [phone]   Fax:  [phone]  Name: Sean Reyes. MRN: [id number] Date of Birth: September 09, 1940

## 2020-01-21 ENCOUNTER — Telehealth: Payer: Medicare Other | Admitting: General Practice

## 2020-01-21 ENCOUNTER — Ambulatory Visit: Payer: Self-pay | Admitting: General Practice

## 2020-01-21 DIAGNOSIS — G8929 Other chronic pain: Secondary | ICD-10-CM

## 2020-01-21 DIAGNOSIS — I152 Hypertension secondary to endocrine disorders: Secondary | ICD-10-CM

## 2020-01-21 DIAGNOSIS — M961 Postlaminectomy syndrome, not elsewhere classified: Secondary | ICD-10-CM

## 2020-01-21 DIAGNOSIS — E1122 Type 2 diabetes mellitus with diabetic chronic kidney disease: Secondary | ICD-10-CM

## 2020-01-21 DIAGNOSIS — E1169 Type 2 diabetes mellitus with other specified complication: Secondary | ICD-10-CM

## 2020-01-21 DIAGNOSIS — N1831 Chronic kidney disease, stage 3a: Secondary | ICD-10-CM

## 2020-01-21 DIAGNOSIS — M5416 Radiculopathy, lumbar region: Secondary | ICD-10-CM

## 2020-01-21 DIAGNOSIS — E1159 Type 2 diabetes mellitus with other circulatory complications: Secondary | ICD-10-CM

## 2020-01-21 DIAGNOSIS — R339 Retention of urine, unspecified: Secondary | ICD-10-CM

## 2020-01-21 NOTE — Chronic Care Management (AMB) (Signed)
Chronic Care Management   Follow Up Note   01/21/2020 Name: Jesiel Garate. MRN: [id number] DOB: 1940/07/20  Referred by: [name], NP Reason for referral : Chronic Care Management (RNCM follow up call for Chronic Disease Management and Care Coordination Needs)   Edin Kon. is a 79 y.o. year old male who is a primary care patient of [name], [name], NP. The CCM team was consulted for assistance with chronic disease management and care coordination needs.    Review of patient status, including review of consultants reports, relevant laboratory and other test results, and collaboration with appropriate care team members and the patient's provider was performed as part of comprehensive patient evaluation and provision of chronic care management services.    SDOH (Social Determinants of Health) assessments performed: Yes See Care Plan activities for detailed interventions related to [facility])     Outpatient Encounter Medications as of 01/21/2020  Medication Sig  . acetaminophen (TYLENOL) 500 MG tablet Take 500 mg by mouth every 6 (six) hours as needed (pain).  [name] albuterol (VENTOLIN HFA) 108 (90 Base) MCG/ACT inhaler Inhale 2 puffs into the lungs every 6 (six) hours as needed for wheezing or shortness of breath.  . benazepril (LOTENSIN) 40 MG tablet Take 1 tablet (40 mg total) by mouth daily.  [name] ELIQUIS 5 MG TABS tablet Take 1 tablet (5 mg total) by mouth 2 (two) times daily.  . finasteride (PROSCAR) 5 MG tablet Take 1 tablet (5 mg total) by mouth daily.  . fluticasone (FLONASE) 50 MCG/ACT nasal spray Place 2 sprays into both nostrils 2 (two) times daily.  [name] gabapentin (NEURONTIN) 300 MG capsule Take 1 capsule (300 mg total) by mouth 2 (two) times daily.  . metFORMIN (GLUCOPHAGE-XR) 500 MG 24 hr tablet Take 2 tablets (1,000 mg total) by mouth 2 (two) times daily.  . metoprolol succinate (TOPROL-XL) 25 MG 24 hr tablet Take 1 tablet by mouth in the morning and at bedtime.   .  pantoprazole (PROTONIX) 40 MG tablet Take 40 mg by mouth at bedtime.   . propafenone (RYTHMOL SR) 425 MG 12 hr capsule Take 425 mg by mouth 2 (two) times daily.  . rosuvastatin (CRESTOR) 5 MG tablet Take 1 tablet (5 mg total) by mouth daily.  . tamsulosin (FLOMAX) 0.4 MG CAPS capsule Take 0.8 mg by mouth daily.   No facility-administered encounter medications on file as of 01/21/2020.     Objective:  BP Readings from Last 3 Encounters:  12/11/19 (!) 148/85  11/25/19 136/80  10/22/19 (!) 154/87    Goals Addressed              This Visit's Progress   .  COMPLETED: RNCM: Pt-"I am doing self catheterization about 3 times a day now" (pt-stated)        CARE PLAN ENTRY (see longitudinal plan of care for additional care plan information)  Current Barriers: Goal completed 01-21-2020.  The patient is no longer having to self cath.  . Knowledge Deficits related to self catheterization needs related to urinary retention after pain stimulator inserted in May of 2021 for chronic back pain . 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 Currently using self catheterization practices . High risk for frequent urinary tract infections due to  urinary retention and having to do self catheterization daily  Nurse Case Manager Clinical Goal(s):  [name] Over the next 120 days, patient will verbalize understanding of plan for urinary retention and continued self catheterization practices . Over the next 120 days, patient will work with [facility], specialist, and pcp to address needs related to urinary retention and best practices for urinary retention . Over the next 120 days, patient will demonstrate a decrease in UTI exacerbations as evidenced by no new UTI and  independent voiding increased without the need of self catheterization . Over the next 120 days, patient will attend all scheduled medical appointments: next appointment with pcp on 10-02-2019. [name] Over the next 120 days, patient will demonstrate improved adherence to prescribed treatment plan for UTI and urinary retention as evidenced byno new UTI and increased independent voiding of urine  Interventions:  . Inter-disciplinary care team collaboration (see longitudinal plan of care) . Evaluation of current treatment plan related to urinary retention and UTI's and patient's adherence to plan as established by provider. 11-19-2019: The patient is happy to report that he is not having to self cath at this time. He says he is getting up a couple times a night and using the bathroom. Usually he has to get about 4 am and then after that he will get up every hour after that to use the bathroom. He says its not the most ideal situation but he is thankful he is not having to self cath right now. If the patient continues to improve and does not have any other needs for self catheterization then the goal may be completed. 01-21-2020: the patient reports he is no longer having to self cath. He does really well during the day but at night a little more difficult. Goal being completed due to not having to self cath at this time.  . Advised patient to call the provider for changes in urinary patterns and suspected UTIs . Provided education to patient re: self catheterization practices and reducing the risk of infection . Collaborated with pcp  regarding patients urinary retention and UTIs as needed . Discussed plans with patient for ongoing care management follow up and provided patient with direct contact information for care management team . Provided patient with self catheterization and urinary retention  educational materials related to urinary retention and frequent UTIs through the EMMI system and My Chart  system . Reviewed scheduled/upcoming provider appointments including: 11-25-2019 with pcp  Patient Self Care Activities:  . Patient verbalizes understanding of plan to work with [facility], specialist, and pcp to help with urinary retention and UTI concerns and best practices for managing urinary health . Calls provider office for new concerns or questions . Unable to independently void as evidence of urinary retention with the need to self catheterize post pain stimulator insertion in May of 2021.  Please see past updates related to this goal by clicking on the "Past Updates" button in the selected goal      .  RNCM: Pt-"I do the best I can" (pt-stated)        [place] (see longtitudinal plan of care for additional care plan information)  Current Barriers:  . Chronic Disease Management support, education, and care coordination needs related to Atrial Fibrillation, HTN, HLD, DMII, and Chronic Back Pain . Limited mobility related to chronic back pain and discomfort  Clinical Goal(s) related to Atrial Fibrillation, HTN, HLD, DMII, and Chronic  Back Pain :  Over the next 120 days, patient  will:  . Work with the care management team to address educational, disease management, and care coordination needs  . Begin or continue self health monitoring activities as directed today Measure and record cbg (blood glucose) 1 times daily, Measure and record blood pressure 3 times per week, and follow a Heart Healthy/ADA diet . Call provider office for new or worsened signs and symptoms Blood glucose findings outside established parameters, Blood pressure findings outside established parameters, Shortness of breath, and New or worsened symptom related to AFIB and Chronic back pain . Call care management team with questions or concerns . Verbalize basic understanding of patient centered plan of care established today  Interventions related to Atrial Fibrillation, HTN, HLD, DMII, and Chronic Back Pain :   . Evaluation of current treatment plans and patient's adherence to plan as established by provider.  The patient is going on May 25th to have a "spine pain stimulator" placed.  He will go back on June the 1st and if this is working for him it will be permanently implanted. The patient is hopeful this will give him much needed relief related to his chronic back pain. His pain level is tolerable when he is sitting down but when he is walking it is almost "unbearable".  Education and support.  11-19-2019: The patient states that he is still having pain but is only on level 5 of the stimulator and the stimulator has 35 programs. He is frustrated because he does not seem to be making any progress in helping with his chronic back pain. The patient states he just had an adjustment on Monday and he thinks that has made it worse.  He has not had any falls but he is walking without a cane or a crutch at this time.  He says he can't mow the yard, blow leaves, do yard work, house work, or any of his other normal activities. He is upset because of the burden he has placed on his daughter.  He is still hopeful that he will be able to do more independently and not have pain.  Encouraged the patient to call the pain stimulator specialist for advice and recommendations on what setting to place the stimulator for some relief to his pain and discomfort. 01-21-2020: The patient is happy to report that they have found the "sweat spot" for the stimulator and he is not experiencing back pain. He is so happy they have found this level and it is doing its job to help with his pain and discomfort. The patient states that he is weak, but is working with physical therapy 2 times a week. He is so thankful for the positive changes.  He continue to do the exercises at home.  He feels so much better. . Assessed patient understanding of disease states- the patient is very knowledgeable about his health conditions but chronic back pain limits him  in managing his care effectively. Encouraged the patient to monitor his blood pressure and blood sugars more frequently.  The patient had a recent hemoglobin A1C of 6.3.  He will continue to take his readings and record findings. He will bring at his next scheduled appointment. Endorses compliance with regimen to manage his chronic conditions. 01-21-2020: the patient states that he is doing so much better. All his other chronic conditions are stable at this time.  . Assessed patient's education and care coordination needs- education and support on monitoring his dietary intake- watching his sodium and sugar. Will continue to encourage healthy food  choices.  Patient verbalized he has gained weight and his weight is 197 now. 01-21-2020: Denies any needs at this time. Did review with the patient watching his diet during the holidays and monitoring for sodium, fats, and sugars.  The patient denies any issues with compliance.  . Assessed safety when ambulating. The patient is using a "crutch" when he walks. The patient verbalized he would not be able to walk if he did not use his crutch. Encouraged the patient to pace his activities and know his limitations. The patient does not like to take pain medications and "deals with the pain".  Encouraged the patient to listen to his body and take breaks as needed to help with pain management. 01-21-2020: The patient is doing so well that he is not having to use his cane to walk.  He is mindful of his surroundings and thankful for the progress he has made. Reminded of safety.  . Provided disease specific education to patient- Information on DASH and ADA diets provided by the MyChart function- completed.  Will search for information related to spine pain stimulator to send by MyChart for the patient.  . Review of Vitamin B12 supplement as recommended by pcp. The patient has started taking the supplement of Vitamin B12 as recommended by the pcp. 01-21-2020: The patient  continues to follow recommendations by the pcp.  Nash Dimmer with appropriate clinical care team members regarding patient needs. Knows of the CCM team for support for support. Denies any needs for pharmacist or LCSW at this time.  . Evaluation of next appointment with pcp: The patient has a follow up appointment with the pcp on 02-26-2019.  The RNCM will continue to follow.  The patient will call for needs or changes before next outreach.   Patient Self Care Activities related to Atrial Fibrillation, HTN, HLD, DMII, and Chronic Back Pain :  . Patient is unable to independently self-manage chronic health conditions  Please see past updates related to this goal by clicking on the "Past Updates" button in the selected goal           Plan:   Telephone follow up appointment with care management team member scheduled for: 03-16-2020 at 0900 am   [place], MSN, [facility] Mobile: [phone]

## 2020-01-21 NOTE — Patient Instructions (Signed)
Visit Information  Goals Addressed              This Visit's Progress   .  COMPLETED: RNCM: Pt-"I am doing self catheterization about 3 times a day now" (pt-stated)        CARE PLAN ENTRY (see longitudinal plan of care for additional care plan information)  Current Barriers: Goal completed 01-21-2020.  The patient is no longer having to self cath.  . Knowledge Deficits related to self catheterization needs related to urinary retention after pain stimulator inserted in May of 2021 for chronic back pain . 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 Currently using self catheterization practices . High risk for frequent urinary tract infections due to urinary retention and having to do self catheterization daily  Nurse Case Manager Clinical Goal(s):  [name] Over the next 120 days, patient will verbalize understanding of plan for urinary retention and continued self catheterization practices . Over the next 120 days, patient will work with [facility], specialist, and pcp to address needs related to urinary retention and best practices for urinary retention . Over the next 120 days, patient will demonstrate a decrease in UTI exacerbations as evidenced by no new UTI and independent voiding increased without the need of self catheterization . Over the next 120 days, patient will attend all scheduled medical appointments: next appointment with pcp on 10-02-2019. [name] Over the next 120 days, patient will demonstrate improved adherence to prescribed treatment plan for UTI and urinary retention as evidenced byno new UTI and increased independent voiding of urine  Interventions:  . Inter-disciplinary care team collaboration (see longitudinal plan of care) . Evaluation  of current treatment plan related to urinary retention and UTI's and patient's adherence to plan as established by provider. 11-19-2019: The patient is happy to report that he is not having to self cath at this time. He says he is getting up a couple times a night and using the bathroom. Usually he has to get about 4 am and then after that he will get up every hour after that to use the bathroom. He says its not the most ideal situation but he is thankful he is not having to self cath right now. If the patient continues to improve and does not have any other needs for self catheterization then the goal may be completed. 01-21-2020: the patient reports he is no longer having to self cath. He does really well during the day but at night a little more difficult. Goal being completed due to not having to self cath at this time.  . Advised patient to call the provider for changes in urinary patterns and suspected UTIs . Provided education to patient re: self catheterization practices and reducing the risk of infection . Collaborated with pcp  regarding patients urinary retention and UTIs as needed . Discussed plans with patient for ongoing care management follow up and provided patient with direct contact information for care management team . Provided patient with self catheterization and urinary retention  educational materials related to urinary retention and frequent UTIs through the EMMI system and My Chart system . Reviewed scheduled/upcoming provider appointments including: 11-25-2019 with pcp  Patient Self Care Activities:  . Patient verbalizes understanding of plan to work with [facility], specialist, and pcp  to help with urinary retention and UTI concerns and best practices for managing urinary health . Calls provider office for new concerns or questions . Unable to independently void as evidence of urinary retention with the need to self catheterize post pain stimulator insertion in May of 2021.  Please  see past updates related to this goal by clicking on the "Past Updates" button in the selected goal      .  RNCM: Pt-"I do the best I can" (pt-stated)        [place] (see longtitudinal plan of care for additional care plan information)  Current Barriers:  . Chronic Disease Management support, education, and care coordination needs related to Atrial Fibrillation, HTN, HLD, DMII, and Chronic Back Pain . Limited mobility related to chronic back pain and discomfort  Clinical Goal(s) related to Atrial Fibrillation, HTN, HLD, DMII, and Chronic  Back Pain :  Over the next 120 days, patient will:  . Work with the care management team to address educational, disease management, and care coordination needs  . Begin or continue self health monitoring activities as directed today Measure and record cbg (blood glucose) 1 times daily, Measure and record blood pressure 3 times per week, and follow a Heart Healthy/ADA diet . Call provider office for new or worsened signs and symptoms Blood glucose findings outside established parameters, Blood pressure findings outside established parameters, Shortness of breath, and New or worsened symptom related to AFIB and Chronic back pain . Call care management team with questions or concerns . Verbalize basic understanding of patient centered plan of care established today  Interventions related to Atrial Fibrillation, HTN, HLD, DMII, and Chronic Back Pain :  . Evaluation of current treatment plans and patient's adherence to plan as established by provider.  The patient is going on May 25th to have a "spine pain stimulator" placed.  He will go back on June the 1st and if this is working for him it will be permanently implanted. The patient is hopeful this will give him much needed relief related to his chronic back pain. His pain level is tolerable when he is sitting down but when he is walking it is almost "unbearable".  Education and support.  11-19-2019: The  patient states that he is still having pain but is only on level 5 of the stimulator and the stimulator has 35 programs. He is frustrated because he does not seem to be making any progress in helping with his chronic back pain. The patient states he just had an adjustment on Monday and he thinks that has made it worse.  He has not had any falls but he is walking without a cane or a crutch at this time.  He says he can't mow the yard, blow leaves, do yard work, house work, or any of his other normal activities. He is upset because of the burden he has placed on his daughter.  He is still hopeful that he will be able to do more independently and not have pain.  Encouraged the patient to call the pain stimulator specialist for advice and recommendations on what setting to place the stimulator for some relief to his pain and discomfort. 01-21-2020: The patient is happy to report that they have found the "sweat spot" for the stimulator and he is not experiencing back pain. He is so happy they have found this level and it is doing its job to help with his pain and discomfort. The patient states that he is  weak, but is working with physical therapy 2 times a week. He is so thankful for the positive changes.  He continue to do the exercises at home.  He feels so much better. . Assessed patient understanding of disease states- the patient is very knowledgeable about his health conditions but chronic back pain limits him in managing his care effectively. Encouraged the patient to monitor his blood pressure and blood sugars more frequently.  The patient had a recent hemoglobin A1C of 6.3.  He will continue to take his readings and record findings. He will bring at his next scheduled appointment. Endorses compliance with regimen to manage his chronic conditions. 01-21-2020: the patient states that he is doing so much better. All his other chronic conditions are stable at this time.  . Assessed patient's education and care  coordination needs- education and support on monitoring his dietary intake- watching his sodium and sugar. Will continue to encourage healthy food choices.  Patient verbalized he has gained weight and his weight is 197 now. 01-21-2020: Denies any needs at this time. Did review with the patient watching his diet during the holidays and monitoring for sodium, fats, and sugars.  The patient denies any issues with compliance.  . Assessed safety when ambulating. The patient is using a "crutch" when he walks. The patient verbalized he would not be able to walk if he did not use his crutch. Encouraged the patient to pace his activities and know his limitations. The patient does not like to take pain medications and "deals with the pain".  Encouraged the patient to listen to his body and take breaks as needed to help with pain management. 01-21-2020: The patient is doing so well that he is not having to use his cane to walk.  He is mindful of his surroundings and thankful for the progress he has made. Reminded of safety.  . Provided disease specific education to patient- Information on DASH and ADA diets provided by the MyChart function- completed.  Will search for information related to spine pain stimulator to send by MyChart for the patient.  . Review of Vitamin B12 supplement as recommended by pcp. The patient has started taking the supplement of Vitamin B12 as recommended by the pcp. 01-21-2020: The patient continues to follow recommendations by the pcp.  Nash Dimmer with appropriate clinical care team members regarding patient needs. Knows of the CCM team for support for support. Denies any needs for pharmacist or LCSW at this time.  . Evaluation of next appointment with pcp: The patient has a follow up appointment with the pcp on 02-26-2019.  The RNCM will continue to follow.  The patient will call for needs or changes before next outreach.   Patient Self Care Activities related to Atrial Fibrillation, HTN,  HLD, DMII, and Chronic Back Pain :  . Patient is unable to independently self-manage chronic health conditions  Please see past updates related to this goal by clicking on the "Past Updates" button in the selected goal         The patient verbalized understanding of instructions, educational materials, and care plan provided today and declined offer to receive copy of patient instructions, educational materials, and care plan.   Telephone follow up appointment with care management team member scheduled for: 03-16-2020 at 0900 am  Noreene Larsson RN, MSN, [facility] Mobile: [phone]

## 2020-01-22 ENCOUNTER — Ambulatory Visit: Payer: Medicare Other

## 2020-01-22 ENCOUNTER — Encounter: Payer: Medicare Other | Admitting: Physical Therapy

## 2020-01-22 ENCOUNTER — Other Ambulatory Visit: Payer: Self-pay

## 2020-01-22 DIAGNOSIS — M545 Low back pain, unspecified: Secondary | ICD-10-CM | POA: Diagnosis not present

## 2020-01-22 DIAGNOSIS — M6281 Muscle weakness (generalized): Secondary | ICD-10-CM

## 2020-01-22 DIAGNOSIS — G8929 Other chronic pain: Secondary | ICD-10-CM

## 2020-01-22 DIAGNOSIS — R2689 Other abnormalities of gait and mobility: Secondary | ICD-10-CM

## 2020-01-22 NOTE — Therapy (Signed)
[facility] [address] [place], [place], [address] Phone: [phone]   Fax:  [phone]  Physical Therapy Treatment  Patient Details  Name: Sean Reyes. MRN: [id number] Date of Birth: 04/25/1940 Referring Provider (PT): Gillis Santa, MD   Encounter Date: 01/22/2020   PT End of Session - 01/22/20 1304    Visit Number 5    Number of Visits 9    Date for PT Re-Evaluation 01/29/20    Authorization Type eval: 01/01/20    PT Start Time 1300    PT Stop Time 1345    PT Time Calculation (min) 45 min    Activity Tolerance Patient tolerated treatment well;Patient limited by fatigue    Behavior During Therapy [facility] for tasks assessed/performed           Past Medical History:  Diagnosis Date  . Anemia   . Anxiety   . Arthritis   . Atrial fibrillation ([place])   . Cataracts, bilateral   . Complication of anesthesia    pt reports low BP's after surgery at [facility] and difficulty awakening  . Depression   . Diabetes ([place])    dx 6-8 yrs ago  . Dysrhythmia    a-fib  . GERD (gastroesophageal reflux disease)    OCC TAKES ALKA SELTZER  . History of kidney stones    10-15 yrs ago  . 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 Hyperlipidemia   . Hypertension   . Nocturia   . S/P ablation of atrial fibrillation    Ablative therapy  . Sleep apnea    CPAP   . Tachycardia, unspecified     Past Surgical History:  Procedure Laterality Date  . ABLATION    . ANTERIOR LAT LUMBAR FUSION N/A 06/27/2017   Procedure: Anterior Lateral Lumbar Interbody  Fusion - Lumbar Two-Lumbar Three - Lumbar Three-Lumbar Four, Posterior Lumbar Interbody Fusion Lumbar Four- Five;  Surgeon: Kary Kos, MD;  Location: [place];  Service: Neurosurgery;  Laterality: N/A;  Anterior Lateral Lumbar Interbody  Fusion - Lumbar Two-Lumbar Three - Lumbar Three-Lumbar Four, Posterior Lumbar Interbody Fusion Lumbar Four- Five  . BACK SURGERY    .  CARDIOVERSION N/A 08/29/2018   Procedure: CARDIOVERSION;  Surgeon: Corey Skains, MD;  Location: [facility];  Service: Cardiovascular;  Laterality: N/A;  . CARDIOVERSION N/A 09/24/2018   Procedure: CARDIOVERSION;  Surgeon: Corey Skains, MD;  Location: [facility];  Service: Cardiovascular;  Laterality: N/A;  . COLONOSCOPY WITH PROPOFOL N/A 10/05/2015   Procedure: COLONOSCOPY WITH PROPOFOL;  Surgeon: Lollie Sails, MD;  Location: [facility];  Service: Endoscopy;  Laterality: N/A;  . ESOPHAGOGASTRODUODENOSCOPY (EGD) WITH PROPOFOL N/A 04/01/2018   Procedure: ESOPHAGOGASTRODUODENOSCOPY (EGD) WITH PROPOFOL;  Surgeon: Lollie Sails, MD;  Location: [facility] ENDOSCOPY;  Service: Endoscopy;  Laterality: N/A;  . HERNIA REPAIR    . JOINT REPLACEMENT Bilateral    hips  RT+  LEFT X2   . LUMBAR LAMINECTOMY/DECOMPRESSION MICRODISCECTOMY Left 09/13/2016   Procedure: Microdiscectomy - Lumbar two-three,  Lumbar three- - left;  Surgeon: Kary Kos, MD;  Location: [place];  Service: Neurosurgery;  Laterality: Left;  . SPINAL CORD STIMULATOR INSERTION  07/08/2019  . TONSILLECTOMY      There were no vitals filed for this visit.   Subjective Assessment - 01/22/20 1306    Subjective Pt reports that he is pretty sore today in his thighs from being active yesterday. However he felt great after  his last session and denies any worsening soreness from therapy. No specific questions or concerns currently.    Pertinent History Pt is a 79 y.o. male referred to PT for LBP. Pt has extensive PMH of failed back surgical syndrome, postlaminectomy syndrome, chronic radicular pain, hx of lumbar fusion spine, spinal cord stimulator, paroxysmal A-fib, DM-Type 2, HTN, chronic kidney disease. Pt reports 4 fusions on lumbar spine due to protruding disks which was ~2 years ago. Pt reports that his spine stimulator has started to help some but his biggest issue is his decreased strength in his spine, legs, and core. In sitting pt has  0/10 NPS. With household distance pt has 4-5/10 NPS but is instantly relieved with sitting. With long walks, from the clinic to the car (100 yds)  8-9/10 NPS. The pain is described as dull pain. Pt denies radicular symptoms. [name] LBP is located acros his belt line right above his buttocks. Pt reports difficulty with his balance and asc/desc stairs but at home he uses no AD at home. Pt does report a fall 1.5 months ago with his cane and fell forward onto his head where he reports he could not catch himself going forward where he hit his head. This is his only fall with no injury. [name] big goal with PT is to be able to indep replace your deck.    How long can you sit comfortably? unlimited    How long can you stand comfortably? 4-5 min    How long can you walk comfortably? 4-5 min    Patient Stated Goals Indep rebuild deck.    Currently in Pain? No/denies             TREATMENT   Ther-ex  SciFit L4 x 5 minutes for warm-up during history with moist heat pack applied to low back (4 minutes unbilled); Hooklying marches x 20 BLE; tried to coordinate exhale for TrA contraction however pt struggles to perform; Hooklying clamsx 20, with manual resistance; Hooklyingadductorsqueezex 20 BLE, with manual resistance; Hooklying SLR x 20 BLE; Hooklying bridges x 20; Supine straight leg hip abductionand adduction with manual resistance fromm therapist x 15BLE each;  Hooklying isometric resisted lumbar rotation with manual resistance at knees 5s hold x 10 each direction; Hooklying isometric resisted trunk flexion/extension with pt holding cane in front of chest and therapist providing resistance in the superior and inferior directions 5s hold x 10 each direction; Hooklying isometric resisted trunk rotation with pt holding cane in front of chest and therapist providing resistance in the right and left directions 5s hold x 10 each direction; Hooklying isometric pball press down into knees for abdominal  activation 5s hold x 10; Hooklying isometric pball rhythmic stabilization with pt holding ball in front of chest and therapist 30s x 2; Sit to stand from regular height chair without UE support x 10;   Pt educated throughout session about proper posture and technique with exercises. Improved exercise technique, movement at target joints, use of target muscles after min to mod verbal, visual, tactile cues.    Patient demonstrates good motivation during session today.  Denies any increase in back pain with exercises but reports some mild low back pain after getting up from laying supine for most of the session.  He reports some thigh soreness upon arrival so most exercises performed in hook lying position.  He demonstrates improved lower extremity strength with the ability to perform sit to stand x10 from regular height chair without upper extremity support. Patient encouraged  to continue HEP and follow-up as scheduled. Pt will benefit from PT services to address deficits in strength and pain in order to return to full function at home.                              PT Long Term Goals - 01/06/20 1414      PT LONG TERM GOAL #1   Title Pt will improve FOTO score to target goal (49) to demonstrate clinically significant improvement in functional mobility.    Baseline 11/18: Perform next session; 01/06/20: 38    Time 4    Period Weeks    Status New    Target Date 01/29/20      PT LONG TERM GOAL #2   Title Pt will improve 5xSTS score by at least 5 sec with no UE support to demonstrate clinically significant improvement in BLE strength.    Baseline 11/18: 27 sec with BUE suppport    Time 4    Period Weeks    Status New    Target Date 01/29/20      PT LONG TERM GOAL #3   Title Pt will improve 2 MWT with no AD by 40' to demonstrate clinically significant improvement in endurance to improve household and community ambulation.    Baseline 11/18: 175' no AD.    Time 4     Period Weeks    Status New    Target Date 01/29/20      PT LONG TERM GOAL #4   Title Pt will ambulate with LRAD with normalized gait pattern > 5 min to reduce tripping hazard with shuffling gait.    Baseline 11/18: Pt amb with single LSC and shuffling gait after 2 min due to fatigue,    Time 4    Period Weeks    Status New    Target Date 01/29/20                 Plan - 01/22/20 1305    Clinical Impression Statement Patient demonstrates good motivation during session today.  Denies any increase in back pain with exercises but reports some mild low back pain after getting up from laying supine for most of the session.  He reports some thigh soreness upon arrival so most exercises performed in hook lying position.  He demonstrates improved lower extremity strength with the ability to perform sit to stand x10 from regular height chair without upper extremity support. Patient encouraged to continue HEP and follow-up as scheduled. Pt will benefit from PT services to address deficits in strength and pain in order to return to full function at home.    Personal Factors and Comorbidities Age;Comorbidity 3+;Education;Past/Current Experience;Time since onset of injury/illness/exacerbation    Comorbidities spinal cord stimulator, paroxysmal A-fib, DM-Type 2, HTN, chronic kidney disease.    Examination-Activity Limitations Bed Mobility;Stand;Locomotion Level;Toileting;Bend;Reach Overhead;Carry;Squat;Hygiene/Grooming;Stairs    Examination-Participation Restrictions Occupation;Community Activity;Yard Work    Stability/Clinical Decision Making Unstable/Unpredictable    Rehab Potential Fair    PT Frequency 2x / week    PT Duration 4 weeks    PT Treatment/Interventions ADLs/Self Care Home Management;Moist Heat;Gait training;Stair training;Therapeutic activities;Therapeutic exercise;Balance training;Neuromuscular re-education;Patient/family education;Manual techniques;Functional mobility  training;Cryotherapy    PT Next Visit Plan Reassess HEP, look at pt's ability to asc/desc stairs. Improve LE strength and endurance.    PT Home Exercise Plan spinal cord stimulator, paroxysmal A-fib, DM-Type 2, HTN, chronic kidney disease.    Consulted and  Agree with Plan of Care Patient           Patient will benefit from skilled therapeutic intervention in order to improve the following deficits and impairments:  Abnormal gait,Pain,Improper body mechanics,Decreased mobility,Postural dysfunction,Decreased activity tolerance,Decreased endurance,Decreased range of motion,Decreased strength,Decreased balance,Difficulty walking,Impaired flexibility  Visit Diagnosis: Chronic bilateral low back pain without sciatica  Other abnormalities of gait and mobility  Muscle weakness (generalized)     Problem List Patient Active Problem List   Diagnosis Date Noted  . Spinal cord stimulator status 12/11/2019  . History of 2019 novel coronavirus disease (COVID-19) 10/02/2019  . Urinary retention 08/01/2019  . CKD (chronic kidney disease) stage 3, GFR 30-59 ml/min (HCC) 01/19/2019  . Acquired thrombophilia ([place]) 01/19/2019  . 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 Postlaminectomy syndrome, lumbar region 01/16/2019  . History of fusion of lumbar spine (L2-L5) 01/16/2019  . Chronic radicular lumbar pain 01/16/2019  . HNP (herniated nucleus pulposus), lumbar 04/29/2018  . Advanced care planning/counseling discussion 09/28/2016  . Spinal stenosis, lumbar region, 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/13/2016  . Hyperlipidemia associated with type 2 diabetes mellitus ([place]) 07/14/2015  . Anemia 06/30/2015  . 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 OSA (obstructive sleep apnea) 03/23/2015  . Type 2 diabetes mellitus with diabetic chronic kidney disease ([place]) 01/05/2015  . Hypertension associated with diabetes ([place]) 09/28/2014  . 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 Arthritis 10/20/2011  . H/O prior ablation treatment 10/19/2011  . Paroxysmal atrial fibrillation (HCC) 10/19/2011   Phillips Grout PT, DPT, GCS  Jahden Schara 01/22/2020, 3:44 PM  [facility] [address]. [place], [place], [address] Phone: [phone]   Fax:  [phone]  Name: Sean Reyes. MRN: [id number] Date of Birth: 03-15-1940

## 2020-01-27 ENCOUNTER — Other Ambulatory Visit: Payer: Self-pay

## 2020-01-27 ENCOUNTER — Encounter: Payer: Medicare Other | Admitting: Physical Therapy

## 2020-01-27 ENCOUNTER — Ambulatory Visit: Payer: Medicare Other

## 2020-01-27 DIAGNOSIS — M6281 Muscle weakness (generalized): Secondary | ICD-10-CM

## 2020-01-27 DIAGNOSIS — M545 Low back pain, unspecified: Secondary | ICD-10-CM

## 2020-01-27 DIAGNOSIS — R2689 Other abnormalities of gait and mobility: Secondary | ICD-10-CM

## 2020-01-27 DIAGNOSIS — G8929 Other chronic pain: Secondary | ICD-10-CM

## 2020-01-27 NOTE — Therapy (Signed)
[facility] [address] [place], [place], [address] Phone: [phone]   Fax:  [phone]  Physical Therapy Treatment  Patient Details  Name: Sean Reyes. MRN: [id number] Date of Birth: 10-03-1940 Referring Provider (PT): Gillis Santa, MD   Encounter Date: 01/27/2020   PT End of Session - 01/27/20 1308    Visit Number 6    Number of Visits 9    Date for PT Re-Evaluation 01/29/20    Authorization Type eval: 01/01/20    PT Start Time 1302    PT Stop Time 1345    PT Time Calculation (min) 43 min    Activity Tolerance Patient tolerated treatment well;Patient limited by fatigue    Behavior During Therapy [facility] for tasks assessed/performed           Past Medical History:  Diagnosis Date  . Anemia   . Anxiety   . Arthritis   . Atrial fibrillation ([place])   . Cataracts, bilateral   . Complication of anesthesia    pt reports low BP's after surgery at [facility] and difficulty awakening  . Depression   . Diabetes ([place])    dx 6-8 yrs ago  . Dysrhythmia    a-fib  . GERD (gastroesophageal reflux disease)    OCC TAKES ALKA SELTZER  . History of kidney stones    10-15 yrs ago  . 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 Hyperlipidemia   . Hypertension   . Nocturia   . S/P ablation of atrial fibrillation    Ablative therapy  . Sleep apnea    CPAP   . Tachycardia, unspecified     Past Surgical History:  Procedure Laterality Date  . ABLATION    . ANTERIOR LAT LUMBAR FUSION N/A 06/27/2017   Procedure: Anterior Lateral Lumbar Interbody  Fusion - Lumbar Two-Lumbar Three - Lumbar Three-Lumbar Four, Posterior Lumbar Interbody Fusion Lumbar Four- Five;  Surgeon: Kary Kos, MD;  Location: [place];  Service: Neurosurgery;  Laterality: N/A;  Anterior Lateral Lumbar Interbody  Fusion - Lumbar Two-Lumbar Three - Lumbar Three-Lumbar Four, Posterior Lumbar Interbody Fusion Lumbar Four- Five  . BACK SURGERY    .  CARDIOVERSION N/A 08/29/2018   Procedure: CARDIOVERSION;  Surgeon: Corey Skains, MD;  Location: [facility];  Service: Cardiovascular;  Laterality: N/A;  . CARDIOVERSION N/A 09/24/2018   Procedure: CARDIOVERSION;  Surgeon: Corey Skains, MD;  Location: [facility];  Service: Cardiovascular;  Laterality: N/A;  . COLONOSCOPY WITH PROPOFOL N/A 10/05/2015   Procedure: COLONOSCOPY WITH PROPOFOL;  Surgeon: Lollie Sails, MD;  Location: [facility]COPY;  Service: Endoscopy;  Laterality: N/A;  . ESOPHAGOGASTRODUODENOSCOPY (EGD) WITH PROPOFOL N/A 04/01/2018   Procedure: ESOPHAGOGASTRODUODENOSCOPY (EGD) WITH PROPOFOL;  Surgeon: Lollie Sails, MD;  Location: [facility];  Service: Endoscopy;  Laterality: N/A;  . HERNIA REPAIR    . JOINT REPLACEMENT Bilateral    hips  RT+  LEFT X2   . LUMBAR LAMINECTOMY/DECOMPRESSION MICRODISCECTOMY Left 09/13/2016   Procedure: Microdiscectomy - Lumbar two-three,  Lumbar three- - left;  Surgeon: Kary Kos, MD;  Location: [place];  Service: Neurosurgery;  Laterality: Left;  . SPINAL CORD STIMULATOR INSERTION  07/08/2019  . TONSILLECTOMY      There were no vitals filed for this visit.   Subjective Assessment - 01/27/20 1306    Subjective Pt reports that he is not feeling great today. He is not having any back pain however he reports that  his back feels very weak. He did have a lot of pain in his back over the weekend. No excessive soreness after last therapy session. Denies any falls since last therapy session. No specific questions or concerns currently.    Pertinent History Pt is a 79 y.o. male referred to PT for LBP. Pt has extensive PMH of failed back surgical syndrome, postlaminectomy syndrome, chronic radicular pain, hx of lumbar fusion spine, spinal cord stimulator, paroxysmal A-fib, DM-Type 2, HTN, chronic kidney disease. Pt reports 4 fusions on lumbar spine due to protruding disks which was ~2 years ago. Pt reports that his spine stimulator has started to help  some but his biggest issue is his decreased strength in his spine, legs, and core. In sitting pt has 0/10 NPS. With household distance pt has 4-5/10 NPS but is instantly relieved with sitting. With long walks, from the clinic to the car (100 yds)  8-9/10 NPS. The pain is described as dull pain. Pt denies radicular symptoms. [name] LBP is located acros his belt line right above his buttocks. Pt reports difficulty with his balance and asc/desc stairs but at home he uses no AD at home. Pt does report a fall 1.5 months ago with his cane and fell forward onto his head where he reports he could not catch himself going forward where he hit his head. This is his only fall with no injury. [name] big goal with PT is to be able to indep replace your deck.    How long can you sit comfortably? unlimited    How long can you stand comfortably? 4-5 min    How long can you walk comfortably? 4-5 min    Patient Stated Goals Indep rebuild deck.    Currently in Pain? No/denies              TREATMENT   Ther-ex SciFitL4x 5 minutes for warm-up during history (50minutes unbilled); Hooklying marches x 20 BLE;  Hooklying lumbar rocking for lumbar relaxation between exercises intermittently; Hooklying ant/post pelvic tilts 2s hold in each direction x 10 each; Hooklying clamsx 15, with manual resistance; Hooklyingadductorsqueezex 15BLE, with manual resistance; Hooklying SLR x 20 BLE; Hooklying bridges x 20; Supine straight leg hip abductionand adduction with manual resistance fromm therapist x 15BLE each;  Hooklying isometric pball press down into knees for abdominal activation 3s hold x 25; Hooklying isometric pball oblique press down into knees for abdominal activation 3s hold x 25; Hooklying isometric pball rhythmic stabilization with pt holding ball in front of chest and therapist x 30s; Seated cane rows and chest press with therapist resisting x 10 each; Seated marches with manual resistance from  therapist x 10 each; Sit to stand from regular height chair without UE supportwith 3# dumbbell 2 x 10;   Pt educated throughout session about proper posture and technique with exercises. Improved exercise technique, movement at target joints, use of target muscles after min to mod verbal, visual, tactile cues.    Patient demonstrates good motivation during session today. Denies any increase in back pain with exercises except some mild discomfort when performing seated rows and chest press.  He is able to progress his sit to stands from a regular height chair by adding a 3 pound dumbbell.  At the end of session patient reports improvement in his back.   He will need updated outcome measures and goals the next session.  Patient encouraged to continue HEP and follow-up as scheduled. Pt will benefit from PT services to address deficits  in strength and pain in order to return to full function at home.                               PT Long Term Goals - 01/06/20 1414      PT LONG TERM GOAL #1   Title Pt will improve FOTO score to target goal (49) to demonstrate clinically significant improvement in functional mobility.    Baseline 11/18: Perform next session; 01/06/20: 38    Time 4    Period Weeks    Status New    Target Date 01/29/20      PT LONG TERM GOAL #2   Title Pt will improve 5xSTS score by at least 5 sec with no UE support to demonstrate clinically significant improvement in BLE strength.    Baseline 11/18: 27 sec with BUE suppport    Time 4    Period Weeks    Status New    Target Date 01/29/20      PT LONG TERM GOAL #3   Title Pt will improve 2 MWT with no AD by 40' to demonstrate clinically significant improvement in endurance to improve household and community ambulation.    Baseline 11/18: 175' no AD.    Time 4    Period Weeks    Status New    Target Date 01/29/20      PT LONG TERM GOAL #4   Title Pt will ambulate with LRAD with normalized  gait pattern > 5 min to reduce tripping hazard with shuffling gait.    Baseline 11/18: Pt amb with single LSC and shuffling gait after 2 min due to fatigue,    Time 4    Period Weeks    Status New    Target Date 01/29/20                 Plan - 01/27/20 1309    Clinical Impression Statement Patient demonstrates good motivation during session today.  Denies any increase in back pain with exercises except some mild discomfort when performing seated rows and chest press.  He is able to progress his sit to stands from a regular height chair by adding a 3 pound dumbbell.  At the end of session patient reports improvement in his back.   He will need updated outcome measures and goals the next session.  Patient encouraged to continue HEP and follow-up as scheduled. Pt will benefit from PT services to address deficits in strength and pain in order to return to full function at home.    Personal Factors and Comorbidities Age;Comorbidity 3+;Education;Past/Current Experience;Time since onset of injury/illness/exacerbation    Comorbidities spinal cord stimulator, paroxysmal A-fib, DM-Type 2, HTN, chronic kidney disease.    Examination-Activity Limitations Bed Mobility;Stand;Locomotion Level;Toileting;Bend;Reach Overhead;Carry;Squat;Hygiene/Grooming;Stairs    Examination-Participation Restrictions Occupation;Community Activity;Yard Work    Stability/Clinical Decision Making Unstable/Unpredictable    Rehab Potential Fair    PT Frequency 2x / week    PT Duration 4 weeks    PT Treatment/Interventions ADLs/Self Care Home Management;Moist Heat;Gait training;Stair training;Therapeutic activities;Therapeutic exercise;Balance training;Neuromuscular re-education;Patient/family education;Manual techniques;Functional mobility training;Cryotherapy    PT Next Visit Plan Reassess HEP, look at pt's ability to asc/desc stairs. Improve LE strength and endurance.    PT Home Exercise Plan spinal cord stimulator,  paroxysmal A-fib, DM-Type 2, HTN, chronic kidney disease.    Consulted and Agree with Plan of Care Patient  Patient will benefit from skilled therapeutic intervention in order to improve the following deficits and impairments:  Abnormal gait,Pain,Improper body mechanics,Decreased mobility,Postural dysfunction,Decreased activity tolerance,Decreased endurance,Decreased range of motion,Decreased strength,Decreased balance,Difficulty walking,Impaired flexibility  Visit Diagnosis: Chronic bilateral low back pain without sciatica  Other abnormalities of gait and mobility  Muscle weakness (generalized)     Problem List Patient Active Problem List   Diagnosis Date Noted  . Spinal cord stimulator status 12/11/2019  . History of 2019 novel coronavirus disease (COVID-19) 10/02/2019  . Urinary retention 08/01/2019  . CKD (chronic kidney disease) stage 3, GFR 30-59 ml/min (HCC) 01/19/2019  . Acquired thrombophilia ([place]) 01/19/2019  . 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 Postlaminectomy syndrome, lumbar region 01/16/2019  . History of fusion of lumbar spine (L2-L5) 01/16/2019  . Chronic radicular lumbar pain 01/16/2019  . HNP (herniated nucleus pulposus), lumbar 04/29/2018  . Advanced care planning/counseling discussion 09/28/2016  . Spinal stenosis, lumbar region, 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/13/2016  . Hyperlipidemia associated with type 2 diabetes mellitus ([place]) 07/14/2015  . Anemia 06/30/2015  . 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 OSA (obstructive sleep apnea) 03/23/2015  . Type 2 diabetes mellitus with diabetic chronic kidney disease ([place]) 01/05/2015  . Hypertension associated with diabetes ([place]) 09/28/2014  . 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[place]) 09/28/2014  . Arthritis 10/20/2011  . H/O prior ablation treatment 10/19/2011  . Paroxysmal atrial fibrillation (HCC) 10/19/2011   Phillips Grout PT, DPT, GCS   Monice Lundy 01/27/2020, 3:55 PM  [facility] [address] [place], [place], [address] Phone: [phone]   Fax:  [phone]  Name: [name]. MRN: [id number] Date of Birth: 03/05/1940

## 2020-01-29 ENCOUNTER — Encounter: Payer: Medicare Other | Admitting: Physical Therapy

## 2020-01-29 ENCOUNTER — Ambulatory Visit: Payer: Medicare Other

## 2020-01-29 ENCOUNTER — Other Ambulatory Visit: Payer: Self-pay

## 2020-01-29 VITALS — BP 173/81 | HR 57

## 2020-01-29 DIAGNOSIS — M545 Low back pain, unspecified: Secondary | ICD-10-CM

## 2020-01-29 DIAGNOSIS — G8929 Other chronic pain: Secondary | ICD-10-CM

## 2020-01-29 DIAGNOSIS — M6281 Muscle weakness (generalized): Secondary | ICD-10-CM

## 2020-01-29 DIAGNOSIS — R2689 Other abnormalities of gait and mobility: Secondary | ICD-10-CM

## 2020-01-29 NOTE — Therapy (Signed)
[facility] [address] [place], [place], [address] Phone: [phone]   Fax:  [phone]  Physical Therapy Treatment/Recertification  Patient Details  Name: Sean Reyes. MRN: [id number] Date of Birth: August 15, 1940 Referring Provider (PT): Gillis Santa, MD   Encounter Date: 01/29/2020   PT End of Session - 01/29/20 1303    Visit Number 7    Number of Visits 25    Date for PT Re-Evaluation 03/25/20    Authorization Type eval: 01/01/20    PT Start Time 1300    PT Stop Time 1345    PT Time Calculation (min) 45 min    Activity Tolerance Patient tolerated treatment well    Behavior During Therapy [facility] for tasks assessed/performed           Past Medical History:  Diagnosis Date  . Anemia   . Anxiety   . Arthritis   . Atrial fibrillation ([place])   . Cataracts, bilateral   . Complication of anesthesia    pt reports low BP's after surgery at [facility] and difficulty awakening  . Depression   . Diabetes ([place])    dx 6-8 yrs ago  . Dysrhythmia    a-fib  . GERD (gastroesophageal reflux disease)    OCC TAKES ALKA SELTZER  . History of kidney stones    10-15 yrs ago  . 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 Hyperlipidemia   . Hypertension   . Nocturia   . S/P ablation of atrial fibrillation    Ablative therapy  . Sleep apnea    CPAP   . Tachycardia, unspecified     Past Surgical History:  Procedure Laterality Date  . ABLATION    . ANTERIOR LAT LUMBAR FUSION N/A 06/27/2017   Procedure: Anterior Lateral Lumbar Interbody  Fusion - Lumbar Two-Lumbar Three - Lumbar Three-Lumbar Four, Posterior Lumbar Interbody Fusion Lumbar Four- Five;  Surgeon: Kary Kos, MD;  Location: [place];  Service: Neurosurgery;  Laterality: N/A;  Anterior Lateral Lumbar Interbody  Fusion - Lumbar Two-Lumbar Three - Lumbar Three-Lumbar Four, Posterior Lumbar Interbody Fusion Lumbar Four- Five  . BACK SURGERY    . CARDIOVERSION N/A  08/29/2018   Procedure: CARDIOVERSION;  Surgeon: Corey Skains, MD;  Location: [facility];  Service: Cardiovascular;  Laterality: N/A;  . CARDIOVERSION N/A 09/24/2018   Procedure: CARDIOVERSION;  Surgeon: Corey Skains, MD;  Location: [facility];  Service: Cardiovascular;  Laterality: N/A;  . COLONOSCOPY WITH PROPOFOL N/A 10/05/2015   Procedure: COLONOSCOPY WITH PROPOFOL;  Surgeon: Lollie Sails, MD;  Location: [facility] ENDOSCOPY;  Service: Endoscopy;  Laterality: N/A;  . ESOPHAGOGASTRODUODENOSCOPY (EGD) WITH PROPOFOL N/A 04/01/2018   Procedure: ESOPHAGOGASTRODUODENOSCOPY (EGD) WITH PROPOFOL;  Surgeon: Lollie Sails, MD;  Location: [facility] ENDOSCOPY;  Service: Endoscopy;  Laterality: N/A;  . HERNIA REPAIR    . JOINT REPLACEMENT Bilateral    hips  RT+  LEFT X2   . LUMBAR LAMINECTOMY/DECOMPRESSION MICRODISCECTOMY Left 09/13/2016   Procedure: Microdiscectomy - Lumbar two-three,  Lumbar three- - left;  Surgeon: Kary Kos, MD;  Location: [place];  Service: Neurosurgery;  Laterality: Left;  . SPINAL CORD STIMULATOR INSERTION  07/08/2019  . TONSILLECTOMY      Vitals:   01/29/20 1309  BP: (!) 173/81  Pulse: (!) 57  SpO2: 100%     Subjective Assessment - 01/29/20 1302    Subjective Pt reports that he is feeling good today. He is not having any  back pain upon arrival. Mild soreness after last therapy session but not excessive. Denies any falls since last therapy session. No specific questions or concerns currently.    Pertinent History Pt is a 79 y.o. male referred to PT for LBP. Pt has extensive PMH of failed back surgical syndrome, postlaminectomy syndrome, chronic radicular pain, hx of lumbar fusion spine, spinal cord stimulator, paroxysmal A-fib, DM-Type 2, HTN, chronic kidney disease. Pt reports 4 fusions on lumbar spine due to protruding disks which was ~2 years ago. Pt reports that his spine stimulator has started to help some but his biggest issue is his decreased strength in his spine, legs,  and core. In sitting pt has 0/10 NPS. With household distance pt has 4-5/10 NPS but is instantly relieved with sitting. With long walks, from the clinic to the car (100 yds)  8-9/10 NPS. The pain is described as dull pain. Pt denies radicular symptoms. [name] LBP is located acros his belt line right above his buttocks. Pt reports difficulty with his balance and asc/desc stairs but at home he uses no AD at home. Pt does report a fall 1.5 months ago with his cane and fell forward onto his head where he reports he could not catch himself going forward where he hit his head. This is his only fall with no injury. [name] big goal with PT is to be able to indep replace your deck.    How long can you sit comfortably? unlimited    How long can you stand comfortably? 4-5 min    How long can you walk comfortably? 4-5 min    Patient Stated Goals Indep rebuild deck.    Currently in Pain? No/denies              TREATMENT   Ther-ex NuStep L0-3x 4 minutes for warm-up during history (68mnutes unbilled); Updated outcome measures and goals with patient: FOTO: 45 (started at 346; 5TSTS: 15.9s 2MWT: Deferred due to increased resting BP upon arrival Hooklying marchesx 20 BLE; Hooklying ant/post pelvic tilts 2s hold in each direction x 20 each; Hooklying clamsx 20,with manual resistance; Hooklyingadductorsqueezex 15BLE, with manual resistance; Hooklying SLR x15 LLE, x 20 RLE; Hooklying bridges x 20; Supine straight leg hip abductionand adduction with manual resistance fromm therapist x 15BLE each; Seated cane rows and chest press with therapist resisting x 10 each; Seated pball forward flexion roll outs x 15; Seated green tband pallof press x 10 each direction; Standing green tband bilateral shoulder extension x 10;   Pt educated throughout session about proper posture and technique with exercises. Improved exercise technique, movement at target joints, use of target muscles after min to  mod verbal, visual, tactile cues.    Patient demonstrates good motivation during session today.  Updated outcome measures and goals with patient today however deferred two minute walk test due to elevated resting blood pressure.  Patient encouraged to monitor blood pressure at home, keep a log, and notify physician if blood pressure readings remain elevated.  Will update 2-minute walk test at upcoming session as appropriate.  Overall patient demonstrates significant improvement in his outcome measures with his FOTO score improving to 45 today having started at 38 at initial evaluation.  His 5 Time Sit to Stand dropped considerably to 15.9 seconds today.  Overall patient reports significant improvement in his lower extremity strength however his endurance remains limited.  Minimal complaints of back pain at this time.  Patient encouraged to continue HEP and follow-up as scheduled.  He has  yet to achieve maximal benefit from PT services.  Patient will continue benefit from skilled PT services to addressdeficits in strength and pain in order to return to full function at home.                             PT Long Term Goals - 01/29/20 1304      PT LONG TERM GOAL #1   Title Pt will improve FOTO score to target goal (49) to demonstrate clinically significant improvement in functional mobility.    Baseline 01/29/20: 45; 01/06/20: 38;    Time 8    Period Weeks    Status Partially Met    Target Date 03/25/20      PT LONG TERM GOAL #2   Title Pt will improve 5xSTS score by at least 5 sec with no UE support to demonstrate clinically significant improvement in BLE strength.    Baseline 01/29/20: 15.9s 11/18: 27 sec with BUE suppport    Time 8    Period Weeks    Status Achieved    Target Date 03/25/20      PT LONG TERM GOAL #3   Title Pt will improve 2 MWT with no AD by 40' to demonstrate clinically significant improvement in endurance to improve household and community  ambulation.    Baseline 01/29/20: Deferred due to elevated resting BP; 11/18: 175' no AD.    Time 8    Period Weeks    Status Deferred    Target Date 03/25/20      PT LONG TERM GOAL #4   Title Pt will ambulate with LRAD with normalized gait pattern > 5 min to reduce tripping hazard with shuffling gait.    Baseline 11/18: Pt amb with single LSC and shuffling gait after 2 min due to fatigue,    Time 8    Period Weeks    Status Deferred    Target Date 03/25/20                 Plan - 01/29/20 1304    Clinical Impression Statement Patient demonstrates good motivation during session today.  Updated outcome measures and goals with patient today however deferred two minute walk test due to elevated resting blood pressure.  Patient encouraged to monitor blood pressure at home, keep a log, and notify physician if blood pressure readings remain elevated.  Will update 2-minute walk test at upcoming session as appropriate.  Overall patient demonstrates significant improvement in his outcome measures with his FOTO score improving to 45 today having started at 38 at initial evaluation.  His 5 Time Sit to Stand dropped considerably to 15.9 seconds today.  Overall patient reports significant improvement in his lower extremity strength however his endurance remains limited.  Minimal complaints of back pain at this time.  Patient encouraged to continue HEP and follow-up as scheduled.  He has yet to achieve maximal benefit from PT services.  Patient will continue benefit from skilled PT services to address deficits in strength and pain in order to return to full function at home.    Personal Factors and Comorbidities Age;Comorbidity 3+;Education;Past/Current Experience;Time since onset of injury/illness/exacerbation    Comorbidities spinal cord stimulator, paroxysmal A-fib, DM-Type 2, HTN, chronic kidney disease.    Examination-Activity Limitations Bed Mobility;Stand;Locomotion Level;Toileting;Bend;Reach  Overhead;Carry;Squat;Hygiene/Grooming;Stairs    Examination-Participation Restrictions Occupation;Community Activity;Yard Work    Stability/Clinical Decision Making Unstable/Unpredictable    Rehab Potential Fair    PT Frequency  2x / week    PT Duration 8 weeks    PT Treatment/Interventions ADLs/Self Care Home Management;Moist Heat;Gait training;Stair training;Therapeutic activities;Therapeutic exercise;Balance training;Neuromuscular re-education;Patient/family education;Manual techniques;Functional mobility training;Cryotherapy    PT Next Visit Plan Reassess HEP, look at pt's ability to asc/desc stairs. Improve LE strength and endurance.    PT Home Exercise Plan spinal cord stimulator, paroxysmal A-fib, DM-Type 2, HTN, chronic kidney disease.    Consulted and Agree with Plan of Care Patient           Patient will benefit from skilled therapeutic intervention in order to improve the following deficits and impairments:  Abnormal gait,Pain,Improper body mechanics,Decreased mobility,Postural dysfunction,Decreased activity tolerance,Decreased endurance,Decreased range of motion,Decreased strength,Decreased balance,Difficulty walking,Impaired flexibility  Visit Diagnosis: Chronic bilateral low back pain without sciatica  Other abnormalities of gait and mobility  Muscle weakness (generalized)     Problem List Patient Active Problem List   Diagnosis Date Noted  . Spinal cord stimulator status 12/11/2019  . History of 2019 novel coronavirus disease (COVID-19) 10/02/2019  . Urinary retention 08/01/2019  . CKD (chronic kidney disease) stage 3, GFR 30-59 ml/min (HCC) 01/19/2019  . Acquired thrombophilia ([place]) 01/19/2019  . 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 Postlaminectomy syndrome, lumbar region 01/16/2019  . History of fusion of lumbar spine (L2-L5) 01/16/2019  . Chronic radicular lumbar pain 01/16/2019  . HNP (herniated nucleus pulposus), lumbar 04/29/2018  . Advanced care  planning/counseling discussion 09/28/2016  . Spinal stenosis, lumbar region, 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/13/2016  . Hyperlipidemia associated with type 2 diabetes mellitus ([place]) 07/14/2015  . Anemia 06/30/2015  . 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 OSA (obstructive sleep apnea) 03/23/2015  . Type 2 diabetes mellitus with diabetic chronic kidney disease ([place]) 01/05/2015  . Hypertension associated with diabetes ([place]) 09/28/2014  . 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[place]) 09/28/2014  . Arthritis 10/20/2011  . H/O prior ablation treatment 10/19/2011  . Paroxysmal atrial fibrillation (HCC) 10/19/2011   Phillips Grout PT, DPT, GCS  Huprich,Jason 01/29/2020, 3:42 PM  [facility] [address] [place], [place], [address] Phone: [phone]   Fax:  [phone]  Name: Sean Reyes. MRN: [id number] Date of Birth: 24-Jun-1940

## 2020-02-02 ENCOUNTER — Other Ambulatory Visit: Payer: Self-pay | Admitting: Nurse Practitioner

## 2020-02-03 ENCOUNTER — Ambulatory Visit: Payer: Medicare Other

## 2020-02-03 ENCOUNTER — Other Ambulatory Visit: Payer: Self-pay

## 2020-02-03 ENCOUNTER — Encounter: Payer: Medicare Other | Admitting: Physical Therapy

## 2020-02-03 VITALS — BP 175/77 | HR 58

## 2020-02-03 DIAGNOSIS — M545 Low back pain, unspecified: Secondary | ICD-10-CM

## 2020-02-03 DIAGNOSIS — M6281 Muscle weakness (generalized): Secondary | ICD-10-CM

## 2020-02-03 DIAGNOSIS — G8929 Other chronic pain: Secondary | ICD-10-CM

## 2020-02-03 NOTE — Therapy (Signed)
[facility] [address] [place], [place], [address] Phone: [phone]   Fax:  [phone]  Physical Therapy Treatment  Patient Details  Name: Sean Reyes. MRN: [id number] Date of Birth: 1940-05-20 Referring Provider (PT): Edward Jolly, MD   Encounter Date: 02/03/2020   PT End of Session - 02/03/20 1303    Visit Number 8    Number of Visits 25    Date for PT Re-Evaluation 03/25/20    Authorization Type eval: 01/01/20    PT Start Time 1300    PT Stop Time 1345    PT Time Calculation (min) 45 min    Activity Tolerance Patient tolerated treatment well    Behavior During Therapy [facility] for tasks assessed/performed           Past Medical History:  Diagnosis Date  . Anemia   . Anxiety   . Arthritis   . Atrial fibrillation (HCC)   . Cataracts, bilateral   . Complication of anesthesia    pt reports low BP's after surgery at [facility] and difficulty awakening  . Depression   . Diabetes (HCC)    dx 6-8 yrs ago  . Dysrhythmia    a-fib  . GERD (gastroesophageal reflux disease)    OCC TAKES ALKA SELTZER  . History of kidney stones    10-15 yrs ago  . 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 Hyperlipidemia   . Hypertension   . Nocturia   . S/P ablation of atrial fibrillation    Ablative therapy  . Sleep apnea    CPAP   . Tachycardia, unspecified     Past Surgical History:  Procedure Laterality Date  . ABLATION    . ANTERIOR LAT LUMBAR FUSION N/A 06/27/2017   Procedure: Anterior Lateral Lumbar Interbody  Fusion - Lumbar Two-Lumbar Three - Lumbar Three-Lumbar Four, Posterior Lumbar Interbody Fusion Lumbar Four- Five;  Surgeon: Donalee Citrin, MD;  Location: [facility] OR;  Service: Neurosurgery;  Laterality: N/A;  Anterior Lateral Lumbar Interbody  Fusion - Lumbar Two-Lumbar Three - Lumbar Three-Lumbar Four, Posterior Lumbar Interbody Fusion Lumbar Four- Five  . BACK SURGERY    . CARDIOVERSION N/A 08/29/2018    Procedure: CARDIOVERSION;  Surgeon: Lamar Blinks, MD;  Location: [facility] ORS;  Service: Cardiovascular;  Laterality: N/A;  . CARDIOVERSION N/A 09/24/2018   Procedure: CARDIOVERSION;  Surgeon: Lamar Blinks, MD;  Location: [facility] ORS;  Service: Cardiovascular;  Laterality: N/A;  . COLONOSCOPY WITH PROPOFOL N/A 10/05/2015   Procedure: COLONOSCOPY WITH PROPOFOL;  Surgeon: Christena Deem, MD;  Location: [facility] ENDOSCOPY;  Service: Endoscopy;  Laterality: N/A;  . ESOPHAGOGASTRODUODENOSCOPY (EGD) WITH PROPOFOL N/A 04/01/2018   Procedure: ESOPHAGOGASTRODUODENOSCOPY (EGD) WITH PROPOFOL;  Surgeon: Christena Deem, MD;  Location: [facility] ENDOSCOPY;  Service: Endoscopy;  Laterality: N/A;  . HERNIA REPAIR    . JOINT REPLACEMENT Bilateral    hips  RT+  LEFT X2   . LUMBAR LAMINECTOMY/DECOMPRESSION MICRODISCECTOMY Left 09/13/2016   Procedure: Microdiscectomy - Lumbar two-three,  Lumbar three- - left;  Surgeon: Donalee Citrin, MD;  Location: [facility] OR;  Service: Neurosurgery;  Laterality: Left;  . SPINAL CORD STIMULATOR INSERTION  07/08/2019  . TONSILLECTOMY      Vitals:   02/03/20 1301  BP: (!) 175/77  Pulse: (!) 58  SpO2: 100%     Subjective Assessment - 02/03/20 1301    Subjective Pt reports that he is doing well today. He has very mild low  back pain upon arrival. He does report some L anterior thigh soreness since the last therapy session. Denies any falls since last therapy session. No specific questions or concerns currently.    Pertinent History Pt is a 79 y.o. male referred to PT for LBP. Pt has extensive PMH of failed back surgical syndrome, postlaminectomy syndrome, chronic radicular pain, hx of lumbar fusion spine, spinal cord stimulator, paroxysmal A-fib, DM-Type 2, HTN, chronic kidney disease. Pt reports 4 fusions on lumbar spine due to protruding disks which was ~2 years ago. Pt reports that his spine stimulator has started to help some but his biggest issue is his decreased strength in his spine,  legs, and core. In sitting pt has 0/10 NPS. With household distance pt has 4-5/10 NPS but is instantly relieved with sitting. With long walks, from the clinic to the car (100 yds)  8-9/10 NPS. The pain is described as dull pain. Pt denies radicular symptoms. [name] LBP is located acros his belt line right above his buttocks. Pt reports difficulty with his balance and asc/desc stairs but at home he uses no AD at home. Pt does report a fall 1.5 months ago with his cane and fell forward onto his head where he reports he could not catch himself going forward where he hit his head. This is his only fall with no injury. [name] big goal with PT is to be able to indep replace your deck.    How long can you sit comfortably? unlimited    How long can you stand comfortably? 4-5 min    How long can you walk comfortably? 4-5 min    Patient Stated Goals Indep rebuild deck.    Currently in Pain? Yes    Pain Score 1     Pain Location Back    Pain Orientation Lower    Pain Descriptors / Indicators Dull;Aching    Pain Type Chronic pain    Pain Onset More than a month ago    Pain Frequency Intermittent                TREATMENT   Ther-ex SciFitx 4 minutes for warm-up during history (52mnutes unbilled); Updated outcome measures and goals with patient: 2MWT: Deferred again due to increased resting BP upon arrival Hooklying marchesx 20 BLE; Hooklying ant/post pelvic tilts 2s hold in each direction x 20 each; Hooklying clamsx 20,with manual resistance; Hooklyingadductorsqueezex 15BLE, with manual resistance; Hooklying SLR x15 LLE, x 20 RLE; Hooklying bridges x 20; Supine straight leg hip abductionand adduction with manual resistance fromm therapist x 15BLE each; Heel slides with manually resisted extension x 10 BLE; Nautilus resisted walking 40# forward, backward, R lateral, and L lateral x 3 each direction; Nautilus lat pull down seated 50# x 10, 60# x 10, 70# x 20; Sit to stand without  UE support with 5# dumbbell x 10;   Pt educated throughout session about proper posture and technique with exercises. Improved exercise technique, movement at target joints, use of target muscles after min to mod verbal, visual, tactile cues.    Patient demonstrates good motivation during session today. Deferred two minute walk test again today due to elevated resting blood pressure.  Pt has restarted his additional anti-hypertensive medication since last therapy session. Minimal complaints of back pain during session today. Progressed strengthening to include Nautilus resisted gait and lat pull downs. He is able to complete sit to stands again today while hold 5# dumbbells. Patient encouraged to continue HEP and follow-up as scheduled.  He has yet to achieve maximal benefit from PT services.  Patient will continue benefit from skilled PT services to addressdeficits in strength and pain in order to return to full function at home.                            PT Long Term Goals - 01/29/20 1304      PT LONG TERM GOAL #1   Title Pt will improve FOTO score to target goal (49) to demonstrate clinically significant improvement in functional mobility.    Baseline 01/29/20: 45; 01/06/20: 38;    Time 8    Period Weeks    Status Partially Met    Target Date 03/25/20      PT LONG TERM GOAL #2   Title Pt will improve 5xSTS score by at least 5 sec with no UE support to demonstrate clinically significant improvement in BLE strength.    Baseline 01/29/20: 15.9s 11/18: 27 sec with BUE suppport    Time 8    Period Weeks    Status Achieved    Target Date 03/25/20      PT LONG TERM GOAL #3   Title Pt will improve 2 MWT with no AD by 40' to demonstrate clinically significant improvement in endurance to improve household and community ambulation.    Baseline 01/29/20: Deferred due to elevated resting BP; 11/18: 175' no AD.    Time 8    Period Weeks    Status Deferred    Target  Date 03/25/20      PT LONG TERM GOAL #4   Title Pt will ambulate with LRAD with normalized gait pattern > 5 min to reduce tripping hazard with shuffling gait.    Baseline 11/18: Pt amb with single LSC and shuffling gait after 2 min due to fatigue,    Time 8    Period Weeks    Status Deferred    Target Date 03/25/20                 Plan - 02/03/20 1302    Clinical Impression Statement Patient demonstrates good motivation during session today. Deferred two minute walk test again today due to elevated resting blood pressure.  Pt has restarted his additional anti-hypertensive medication since last therapy session. Minimal complaints of back pain during session today. Progressed strengthening to include Nautilus resisted gait and lat pull downs. He is able to complete sit to stands again today while hold 5# dumbbells. Patient encouraged to continue HEP and follow-up as scheduled.  He has yet to achieve maximal benefit from PT services.  Patient will continue benefit from skilled PT services to address deficits in strength and pain in order to return to full function at home.    Personal Factors and Comorbidities Age;Comorbidity 3+;Education;Past/Current Experience;Time since onset of injury/illness/exacerbation    Comorbidities spinal cord stimulator, paroxysmal A-fib, DM-Type 2, HTN, chronic kidney disease.    Examination-Activity Limitations Bed Mobility;Stand;Locomotion Level;Toileting;Bend;Reach Overhead;Carry;Squat;Hygiene/Grooming;Stairs    Examination-Participation Restrictions Occupation;Community Activity;Yard Work    Stability/Clinical Decision Making Unstable/Unpredictable    Rehab Potential Fair    PT Frequency 2x / week    PT Duration 8 weeks    PT Treatment/Interventions ADLs/Self Care Home Management;Moist Heat;Gait training;Stair training;Therapeutic activities;Therapeutic exercise;Balance training;Neuromuscular re-education;Patient/family education;Manual  techniques;Functional mobility training;Cryotherapy    PT Next Visit Plan Reassess HEP, Improve LE strength and endurance.    PT Home Exercise Plan spinal cord stimulator, paroxysmal A-fib, DM-Type 2,  HTN, chronic kidney disease.    Consulted and Agree with Plan of Care Patient           Patient will benefit from skilled therapeutic intervention in order to improve the following deficits and impairments:  Abnormal gait,Pain,Improper body mechanics,Decreased mobility,Postural dysfunction,Decreased activity tolerance,Decreased endurance,Decreased range of motion,Decreased strength,Decreased balance,Difficulty walking,Impaired flexibility  Visit Diagnosis: Chronic bilateral low back pain without sciatica  Muscle weakness (generalized)     Problem List Patient Active Problem List   Diagnosis Date Noted  . Spinal cord stimulator status 12/11/2019  . History of 2019 novel coronavirus disease (COVID-19) 10/02/2019  . Urinary retention 08/01/2019  . CKD (chronic kidney disease) stage 3, GFR 30-59 ml/min (HCC) 01/19/2019  . Acquired thrombophilia ([place]) 01/19/2019  . 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 Postlaminectomy syndrome, lumbar region 01/16/2019  . History of fusion of lumbar spine (L2-L5) 01/16/2019  . Chronic radicular lumbar pain 01/16/2019  . HNP (herniated nucleus pulposus), lumbar 04/29/2018  . Advanced care planning/counseling discussion 09/28/2016  . Spinal stenosis, lumbar region, 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/13/2016  . Hyperlipidemia associated with type 2 diabetes mellitus ([place]) 07/14/2015  . Anemia 06/30/2015  . 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 OSA (obstructive sleep apnea) 03/23/2015  . Type 2 diabetes mellitus with diabetic chronic kidney disease ([place]) 01/05/2015  . Hypertension associated with diabetes ([place]) 09/28/2014  . 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[place]) 09/28/2014  . Arthritis 10/20/2011  . H/O prior  ablation treatment 10/19/2011  . Paroxysmal atrial fibrillation ([place]) 10/19/2011   Phillips Grout PT, DPT, GCS  Egon Dittus 02/03/2020, 4:12 PM  [place] [facility] [address] [place], [place], [address] Phone: [phone]   Fax:  [phone]  Name: Sean Reyes. MRN: [id number] Date of Birth: 11/03/1940

## 2020-02-05 ENCOUNTER — Ambulatory Visit: Payer: Medicare Other

## 2020-02-05 ENCOUNTER — Other Ambulatory Visit: Payer: Self-pay

## 2020-02-05 ENCOUNTER — Encounter: Payer: Medicare Other | Admitting: Physical Therapy

## 2020-02-05 DIAGNOSIS — M6281 Muscle weakness (generalized): Secondary | ICD-10-CM

## 2020-02-05 DIAGNOSIS — M545 Low back pain, unspecified: Secondary | ICD-10-CM | POA: Diagnosis not present

## 2020-02-05 DIAGNOSIS — G8929 Other chronic pain: Secondary | ICD-10-CM

## 2020-02-05 NOTE — Therapy (Signed)
[facility] [address] [place], [place], [address] Phone: [phone]   Fax:  [phone]  Physical Therapy Treatment  Patient Details  Name: Sean Reyes. MRN: [id number] Date of Birth: 06-29-1940 Referring Provider (PT): Gillis Santa, MD   Encounter Date: 02/05/2020   PT End of Session - 02/05/20 1306    Visit Number 9    Number of Visits 25    Date for PT Re-Evaluation 03/25/20    Authorization Type eval: 01/01/20    PT Start Time 1300    PT Stop Time 1345    PT Time Calculation (min) 45 min    Activity Tolerance Patient tolerated treatment well    Behavior During Therapy [facility] for tasks assessed/performed           Past Medical History:  Diagnosis Date  . Anemia   . Anxiety   . Arthritis   . Atrial fibrillation ([place])   . Cataracts, bilateral   . Complication of anesthesia    pt reports low BP's after surgery at [facility] and difficulty awakening  . Depression   . Diabetes ([place])    dx 6-8 yrs ago  . Dysrhythmia    a-fib  . GERD (gastroesophageal reflux disease)    OCC TAKES ALKA SELTZER  . History of kidney stones    10-15 yrs ago  . 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 Hyperlipidemia   . Hypertension   . Nocturia   . S/P ablation of atrial fibrillation    Ablative therapy  . Sleep apnea    CPAP   . Tachycardia, unspecified     Past Surgical History:  Procedure Laterality Date  . ABLATION    . ANTERIOR LAT LUMBAR FUSION N/A 06/27/2017   Procedure: Anterior Lateral Lumbar Interbody  Fusion - Lumbar Two-Lumbar Three - Lumbar Three-Lumbar Four, Posterior Lumbar Interbody Fusion Lumbar Four- Five;  Surgeon: Kary Kos, MD;  Location: [place];  Service: Neurosurgery;  Laterality: N/A;  Anterior Lateral Lumbar Interbody  Fusion - Lumbar Two-Lumbar Three - Lumbar Three-Lumbar Four, Posterior Lumbar Interbody Fusion Lumbar Four- Five  . BACK SURGERY    . CARDIOVERSION N/A 08/29/2018    Procedure: CARDIOVERSION;  Surgeon: Corey Skains, MD;  Location: [facility] ORS;  Service: Cardiovascular;  Laterality: N/A;  . CARDIOVERSION N/A 09/24/2018   Procedure: CARDIOVERSION;  Surgeon: Corey Skains, MD;  Location: [facility] ORS;  Service: Cardiovascular;  Laterality: N/A;  . COLONOSCOPY WITH PROPOFOL N/A 10/05/2015   Procedure: COLONOSCOPY WITH PROPOFOL;  Surgeon: Lollie Sails, MD;  Location[facility] ENDOSCOPY;  Service: Endoscopy;  Laterality: N/A;  . ESOPHAGOGASTRODUODENOSCOPY (EGD) WITH PROPOFOL N/A 04/01/2018   Procedure: ESOPHAGOGASTRODUODENOSCOPY (EGD) WITH PROPOFOL;  Surgeon: Lollie Sails, MD;  Location: [facility] ENDOSCOPY;  Service: Endoscopy;  Laterality: N/A;  . HERNIA REPAIR    . JOINT REPLACEMENT Bilateral    hips  RT+  LEFT X2   . LUMBAR LAMINECTOMY/DECOMPRESSION MICRODISCECTOMY Left 09/13/2016   Procedure: Microdiscectomy - Lumbar two-three,  Lumbar three- - left;  Surgeon: Kary Kos, MD;  Location: [place];  Service: Neurosurgery;  Laterality: Left;  . SPINAL CORD STIMULATOR INSERTION  07/08/2019  . TONSILLECTOMY      There were no vitals filed for this visit.   Subjective Assessment - 02/05/20 1303    Subjective Pt reports that he is having a lot of soreness in his L thigh upon arrival today. He continues to feel like his  legs are very weak and due to the LLE soreness he is having difficulty accepting weight onto LLE. Denies any falls since last therapy session. No specific questions upon arrival.    Pertinent History Pt is a 79 y.o. male referred to PT for LBP. Pt has extensive PMH of failed back surgical syndrome, postlaminectomy syndrome, chronic radicular pain, hx of lumbar fusion spine, spinal cord stimulator, paroxysmal A-fib, DM-Type 2, HTN, chronic kidney disease. Pt reports 4 fusions on lumbar spine due to protruding disks which was ~2 years ago. Pt reports that his spine stimulator has started to help some but his biggest issue is his decreased strength in his  spine, legs, and core. In sitting pt has 0/10 NPS. With household distance pt has 4-5/10 NPS but is instantly relieved with sitting. With long walks, from the clinic to the car (100 yds)  8-9/10 NPS. The pain is described as dull pain. Pt denies radicular symptoms. [name] LBP is located acros his belt line right above his buttocks. Pt reports difficulty with his balance and asc/desc stairs but at home he uses no AD at home. Pt does report a fall 1.5 months ago with his cane and fell forward onto his head where he reports he could not catch himself going forward where he hit his head. This is his only fall with no injury. [name] big goal with PT is to be able to indep replace your deck.    How long can you sit comfortably? unlimited    How long can you stand comfortably? 4-5 min    How long can you walk comfortably? 4-5 min    Patient Stated Goals Indep rebuild deck.    Currently in Pain? Yes    Pain Score 4     Pain Location Leg    Pain Orientation Left    Pain Descriptors / Indicators Sore    Pain Type Chronic pain    Pain Onset More than a month ago           TREATMENT   Ther-ex NuStep L2 x 4 minutes for warm-up during history (80mnutes unbilled); Hooklying marchesx 20 BLE; Hooklying ant/post pelvic tilts 2s hold in each direction x 20 each; Hooklyingadductorsqueeze2 x10BLE, with manual resistance; Side-lying clams with manual resistance from therapist 2x10 BLE; Hooklying bridges x 20; Supine straight leg hip abductionand adduction with manual resistance fromm therapist x 15BLE each;   Manual Therapy  Single knee-to-chest stretch 30s hold BLE; Hip IR/ER stretch 30 sec. hold BLE; Proximal and distal hamstring stretch 30 sec. hold each BLE; Right side-lying left quad stretch 30 sec. Hold; Right side-lying STM to left quad, T FL, IT band, and posterior hip with Thera roller;   Pt educated throughout session about proper posture and technique with exercises. Improved  exercise technique, movement at target joints, use of target muscles after min to mod verbal, visual, tactile cues.    Patient demonstrates good motivation during session today.  He is reporting increased soreness in left quad upon arrival today.  Additional time today spent stretching bilateral lower extremities and performing soft tissue mobilization to left quad.  Patient reports significant improvement at end of session.  Continued with additional bilateral lower extremity strengthening in supine hook lying. Patient encouraged to continue HEP and follow-up as scheduled.  He has yet to achieve maximal benefit from PT services.  Patient will continue benefit from skilled PT services to addressdeficits in strength and pain in order to return to full function at home.  PT Long Term Goals - 01/29/20 1304      PT LONG TERM GOAL #1   Title Pt will improve FOTO score to target goal (49) to demonstrate clinically significant improvement in functional mobility.    Baseline 01/29/20: 45; 01/06/20: 38;    Time 8    Period Weeks    Status Partially Met    Target Date 03/25/20      PT LONG TERM GOAL #2   Title Pt will improve 5xSTS score by at least 5 sec with no UE support to demonstrate clinically significant improvement in BLE strength.    Baseline 01/29/20: 15.9s 11/18: 27 sec with BUE suppport    Time 8    Period Weeks    Status Achieved    Target Date 03/25/20      PT LONG TERM GOAL #3   Title Pt will improve 2 MWT with no AD by 40' to demonstrate clinically significant improvement in endurance to improve household and community ambulation.    Baseline 01/29/20: Deferred due to elevated resting BP; 11/18: 175' no AD.    Time 8    Period Weeks    Status Deferred    Target Date 03/25/20      PT LONG TERM GOAL #4   Title Pt will ambulate with LRAD with normalized gait pattern > 5 min to reduce tripping hazard with shuffling  gait.    Baseline 11/18: Pt amb with single LSC and shuffling gait after 2 min due to fatigue,    Time 8    Period Weeks    Status Deferred    Target Date 03/25/20                 Plan - 02/05/20 1332    Clinical Impression Statement Patient demonstrates good motivation during session today.  He is reporting increased soreness in left quad upon arrival today.  Additional time today spent stretching bilateral lower extremities and performing soft tissue mobilization to left quad.  Patient reports significant improvement at end of session.  Continued with additional bilateral lower extremity strengthening in supine hook lying. Patient encouraged to continue HEP and follow-up as scheduled.  He has yet to achieve maximal benefit from PT services.  Patient will continue benefit from skilled PT services to address deficits in strength and pain in order to return to full function at home.    Personal Factors and Comorbidities Age;Comorbidity 3+;Education;Past/Current Experience;Time since onset of injury/illness/exacerbation    Comorbidities spinal cord stimulator, paroxysmal A-fib, DM-Type 2, HTN, chronic kidney disease.    Examination-Activity Limitations Bed Mobility;Stand;Locomotion Level;Toileting;Bend;Reach Overhead;Carry;Squat;Hygiene/Grooming;Stairs    Examination-Participation Restrictions Occupation;Community Activity;Yard Work    Stability/Clinical Decision Making Unstable/Unpredictable    Rehab Potential Fair    PT Frequency 2x / week    PT Duration 8 weeks    PT Treatment/Interventions ADLs/Self Care Home Management;Moist Heat;Gait training;Stair training;Therapeutic activities;Therapeutic exercise;Balance training;Neuromuscular re-education;Patient/family education;Manual techniques;Functional mobility training;Cryotherapy    PT Next Visit Plan Reassess HEP, Improve LE strength and endurance.    PT Home Exercise Plan spinal cord stimulator, paroxysmal A-fib, DM-Type 2, HTN, chronic  kidney disease.    Consulted and Agree with Plan of Care Patient           Patient will benefit from skilled therapeutic intervention in order to improve the following deficits and impairments:  Abnormal gait,Pain,Improper body mechanics,Decreased mobility,Postural dysfunction,Decreased activity tolerance,Decreased endurance,Decreased range of motion,Decreased strength,Decreased balance,Difficulty walking,Impaired flexibility  Visit Diagnosis: Chronic bilateral low back pain without sciatica  Muscle weakness (generalized)     Problem List Patient Active Problem List   Diagnosis Date Noted  . Spinal cord stimulator status 12/11/2019  . History of 2019 novel coronavirus disease (COVID-19) 10/02/2019  . Urinary retention 08/01/2019  . CKD (chronic kidney disease) stage 3, GFR 30-59 ml/min (HCC) 01/19/2019  . Acquired thrombophilia ([place]) 01/19/2019  . 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 Postlaminectomy syndrome, lumbar region 01/16/2019  . History of fusion of lumbar spine (L2-L5) 01/16/2019  . Chronic radicular lumbar pain 01/16/2019  . HNP (herniated nucleus pulposus), lumbar 04/29/2018  . Advanced care planning/counseling discussion 09/28/2016  . Spinal stenosis, lumbar region, 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/13/2016  . Hyperlipidemia associated with type 2 diabetes mellitus ([place]) 07/14/2015  . Anemia 06/30/2015  . 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 OSA (obstructive sleep apnea) 03/23/2015  . Type 2 diabetes mellitus with diabetic chronic kidney disease ([place]) 01/05/2015  . Hypertension associated with diabetes ([place]) 09/28/2014  . 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[place]) 09/28/2014  . Arthritis 10/20/2011  . H/O prior ablation treatment 10/19/2011  . Paroxysmal atrial fibrillation ([place]) 10/19/2011   Phillips Grout PT, DPT, GCS  Huprich,Jason 02/05/2020, 4:14 PM  [place] [facility] [address] [place], [place], [address] Phone: [phone]   Fax:  [phone]  Name: Sean Reyes. MRN: [id number] Date of Birth: 10/24/1940

## 2020-02-10 ENCOUNTER — Ambulatory Visit: Payer: Medicare Other

## 2020-02-10 ENCOUNTER — Encounter: Payer: Medicare Other | Admitting: Physical Therapy

## 2020-02-10 NOTE — Patient Instructions (Incomplete)
Physical Therapy Progress Note (no need to update outcome measures except )   Dates of reporting period  01/01/20   to   02/10/20  TREATMENT   Ther-ex NuStep L2 x 4 minutes for warm-up during history ( unbilled); Hooklying marchesx 20 BLE; Hooklying ant/post pelvic tilts 2s hold in each direction x 20 each; Hooklyingadductorsqueeze2 x10BLE, with manual resistance; Side-lying clams with manual resistance from therapist 2x10 BLE; Hooklying bridges x 20; Supine straight leg hip abductionand adduction with manual resistance fromm therapist x 15BLE each;   Manual Therapy  Single knee-to-chest stretch 30s hold BLE; Hip IR/ER stretch 30 sec. hold BLE; Proximal and distal hamstring stretch 30 sec. hold each BLE; Right side-lying left quad stretch 30 sec. Hold; Right side-lying STM to left quad, T FL, IT band, and posterior hip with Thera roller;   Pt educated throughout session about proper posture and technique with exercises. Improved exercise technique, movement at target joints, use of target muscles after min to mod verbal, visual, tactile cues.   Patient demonstrates good motivation during session today.  Updated outcome measures and goals with patient today however deferred two minute walk test due to elevated resting blood pressure.  Patient encouraged to monitor blood pressure at home, keep a log, and notify physician if blood pressure readings remain elevated.  Will update 2-minute walk test at upcoming session as appropriate.  Overall patient demonstrates significant improvement in his outcome measures with his FOTO score improving to 45 today having started at 38 at initial evaluation.  His 5 Time Sit to Stand dropped considerably to 15.9 seconds today.  Overall patient reports significant improvement in his lower extremity strength however his endurance remains limited.  Minimal complaints of back pain at this time.  Patient encouraged to continue HEP  and follow-up as scheduled.  He has yet to achieve maximal benefit from PT services.  Patient will continue benefit from skilled PT services to addressdeficits in strength and pain in order to return to full function at home.   Patient demonstrates good motivation during session today.  He is reporting increased soreness in left quad upon arrival today.  Additional time today spent stretching bilateral lower extremities and performing soft tissue mobilization to left quad.  Patient reports significant improvement at end of session.  Continued with additional bilateral lower extremity strengthening in supine hook lying. Patient encouraged to continue HEP and follow-up as scheduled. He has yet to achieve maximal benefit from PT services. Patient will continue benefit from skilled PT services to addressdeficits in strength and pain in order to return to full function at home.

## 2020-02-12 ENCOUNTER — Encounter: Payer: Medicare Other | Admitting: Physical Therapy

## 2020-02-12 ENCOUNTER — Ambulatory Visit: Payer: Medicare Other

## 2020-02-12 DIAGNOSIS — Z23 Encounter for immunization: Secondary | ICD-10-CM | POA: Diagnosis not present

## 2020-02-12 DIAGNOSIS — E782 Mixed hyperlipidemia: Secondary | ICD-10-CM | POA: Diagnosis not present

## 2020-02-12 DIAGNOSIS — I480 Paroxysmal atrial fibrillation: Secondary | ICD-10-CM | POA: Diagnosis not present

## 2020-02-12 DIAGNOSIS — I10 Essential (primary) hypertension: Secondary | ICD-10-CM | POA: Diagnosis not present

## 2020-02-12 NOTE — Patient Instructions (Incomplete)
Physical Therapy Progress Note   Dates of reporting period  ***   to   02/12/20 (no need to update outcome measures)  TREATMENT   Ther-ex NuStep L2 x 4 minutes for warm-up during history ( unbilled); Hooklying marchesx 20 BLE; Hooklying ant/post pelvic tilts 2s hold in each direction x 20 each; Hooklyingadductorsqueeze2 x10BLE, with manual resistance; Side-lying clams with manual resistance from therapist 2x10 BLE; Hooklying bridges x 20; Supine straight leg hip abductionand adduction with manual resistance fromm therapist x 15BLE each;   Manual Therapy  Single knee-to-chest stretch 30s hold BLE; Hip IR/ER stretch 30 sec. hold BLE; Proximal and distal hamstring stretch 30 sec. hold each BLE; Right side-lying left quad stretch 30 sec. Hold; Right side-lying STM to left quad, T FL, IT band, and posterior hip with Thera roller;   Pt educated throughout session about proper posture and technique with exercises. Improved exercise technique, movement at target joints, use of target muscles after min to mod verbal, visual, tactile cues.    Patient demonstrates good motivation during session today.  He is reporting increased soreness in left quad upon arrival today.  Additional time today spent stretching bilateral lower extremities and performing soft tissue mobilization to left quad.  Patient reports significant improvement at end of session.  Continued with additional bilateral lower extremity strengthening in supine hook lying. Patient encouraged to continue HEP and follow-up as scheduled. He has yet to achieve maximal benefit from PT services. Patient will continue benefit from skilled PT services to addressdeficits in strength and pain in order to return to full function at home.

## 2020-02-16 NOTE — Patient Instructions (Addendum)
Physical Therapy Progress Note   Dates of reporting period  01/01/20   to   02/17/19   Ther-ex NuStep L2 x 4 minutes for warm-up during history ( unbilled); Hooklying marchesx 20 BLE; Hooklying ant/post pelvic tilts 2s hold in each direction x 20 each; Hooklyingadductorsqueeze2 x10BLE, with manual resistance; Side-lying clams with manual resistance from therapist 2x10 BLE; Hooklying bridges x 20; Supine straight leg hip abductionand adduction with manual resistance fromm therapist x 15BLE each;   Manual Therapy  Single knee-to-chest stretch 30s hold BLE; Hip IR/ER stretch 30 sec. hold BLE; Proximal and distal hamstring stretch 30 sec. hold each BLE; Right side-lying left quad stretch 30 sec. Hold; Right side-lying STM to left quad, T FL, IT band, and posterior hip with Thera roller;   Pt educated throughout session about proper posture and technique with exercises. Improved exercise technique, movement at target joints, use of target muscles after min to mod verbal, visual, tactile cues.    Patient demonstrates good motivation during session today.  He is reporting increased soreness in left quad upon arrival today.  Additional time today spent stretching bilateral lower extremities and performing soft tissue mobilization to left quad.  Patient reports significant improvement at end of session.  Continued with additional bilateral lower extremity strengthening in supine hook lying. Patient encouraged to continue HEP and follow-up as scheduled. He has yet to achieve maximal benefit from PT services. Patient will continue benefit from skilled PT services to addressdeficits in strength and pain in order to return to full function at home.

## 2020-02-17 ENCOUNTER — Ambulatory Visit: Payer: Medicare Other | Attending: Student in an Organized Health Care Education/Training Program

## 2020-02-17 ENCOUNTER — Other Ambulatory Visit: Payer: Self-pay

## 2020-02-17 VITALS — BP 158/69 | HR 51

## 2020-02-17 DIAGNOSIS — M6281 Muscle weakness (generalized): Secondary | ICD-10-CM | POA: Diagnosis not present

## 2020-02-17 DIAGNOSIS — M545 Low back pain, unspecified: Secondary | ICD-10-CM | POA: Insufficient documentation

## 2020-02-17 DIAGNOSIS — R2689 Other abnormalities of gait and mobility: Secondary | ICD-10-CM | POA: Insufficient documentation

## 2020-02-17 DIAGNOSIS — G8929 Other chronic pain: Secondary | ICD-10-CM | POA: Diagnosis not present

## 2020-02-17 NOTE — Therapy (Signed)
[facility] [address] [place], [place], [address] Phone: [phone]   Fax:  [phone]  Physical Therapy Progress Note  Dates of reporting period  01/01/20   to   02/17/19   Patient Details  Name: Sean Reyes. MRN: [id number] Date of Birth: Jul 14, 1940 Referring Provider (PT): Gillis Santa, MD   Encounter Date: 02/17/2020   PT End of Session - 02/17/20 1300    Visit Number 10    Number of Visits 25    Date for PT Re-Evaluation 03/25/20    Authorization Type eval: 01/01/20    PT Start Time 1259    PT Stop Time 1344    PT Time Calculation (min) 45 min    Activity Tolerance Patient tolerated treatment well    Behavior During Therapy [facility] for tasks assessed/performed           Past Medical History:  Diagnosis Date  . Anemia   . Anxiety   . Arthritis   . Atrial fibrillation ([place])   . Cataracts, bilateral   . Complication of anesthesia    pt reports low BP's after surgery at [facility] and difficulty awakening  . Depression   . Diabetes ([place])    dx 6-8 yrs ago  . Dysrhythmia    a-fib  . GERD (gastroesophageal reflux disease)    OCC TAKES ALKA SELTZER  . History of kidney stones    10-15 yrs ago  . 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 Hyperlipidemia   . Hypertension   . Nocturia   . S/P ablation of atrial fibrillation    Ablative therapy  . Sleep apnea    CPAP   . Tachycardia, unspecified     Past Surgical History:  Procedure Laterality Date  . ABLATION    . ANTERIOR LAT LUMBAR FUSION N/A 06/27/2017   Procedure: Anterior Lateral Lumbar Interbody  Fusion - Lumbar Two-Lumbar Three - Lumbar Three-Lumbar Four, Posterior Lumbar Interbody Fusion Lumbar Four- Five;  Surgeon: Kary Kos, MD;  Location: [facility];  Service: Neurosurgery;  Laterality: N/A;  Anterior Lateral Lumbar Interbody  Fusion - Lumbar Two-Lumbar Three - Lumbar Three-Lumbar Four, Posterior Lumbar Interbody Fusion Lumbar Four- Five   . BACK SURGERY    . CARDIOVERSION N/A 08/29/2018   Procedure: CARDIOVERSION;  Surgeon: Corey Skains, MD;  Location: [facility];  Service: Cardiovascular;  Laterality: N/A;  . CARDIOVERSION N/A 09/24/2018   Procedure: CARDIOVERSION;  Surgeon: Corey Skains, MD;  Location: [facility];  Service: Cardiovascular;  Laterality: N/A;  . COLONOSCOPY WITH PROPOFOL N/A 10/05/2015   Procedure: COLONOSCOPY WITH PROPOFOL;  Surgeon: Lollie Sails, MD;  Location: [facility]COPY;  Service: Endoscopy;  Laterality: N/A;  . ESOPHAGOGASTRODUODENOSCOPY (EGD) WITH PROPOFOL N/A 04/01/2018   Procedure: ESOPHAGOGASTRODUODENOSCOPY (EGD) WITH PROPOFOL;  Surgeon: Lollie Sails, MD;  Location: [facility] ENDOSCOPY;  Service: Endoscopy;  Laterality: N/A;  . HERNIA REPAIR    . JOINT REPLACEMENT Bilateral    hips  RT+  LEFT X2   . LUMBAR LAMINECTOMY/DECOMPRESSION MICRODISCECTOMY Left 09/13/2016   Procedure: Microdiscectomy - Lumbar two-three,  Lumbar three- - left;  Surgeon: Kary Kos, MD;  Location: [facility];  Service: Neurosurgery;  Laterality: Left;  . SPINAL CORD STIMULATOR INSERTION  07/08/2019  . TONSILLECTOMY      Vitals:   02/17/20 1304  BP: (!) 158/69  Pulse: (!) 51  SpO2: 100%     Subjective Assessment - 02/17/20 1300  Subjective Pt reports that he is doing well today. He saw his cardiologist who placed him on a new anti-hypertension agent which has helped lower his blood pressure and has also lowered his pulse. Cardiologist is aware of his lowered heart rate since starting his new agent. Pt reports that he feels much better since starting this new medication. He continues to have intermittent L thigh soreness and rates it as a 4/10 upon arrival. Denies any falls since last therapy session. No specific questions upon arrival.    Pertinent History Pt is a 80 y.o. male referred to PT for LBP. Pt has extensive PMH of failed back surgical syndrome, postlaminectomy syndrome, chronic radicular pain, hx of lumbar  fusion spine, spinal cord stimulator, paroxysmal A-fib, DM-Type 2, HTN, chronic kidney disease. Pt reports 4 fusions on lumbar spine due to protruding disks which was ~2 years ago. Pt reports that his spine stimulator has started to help some but his biggest issue is his decreased strength in his spine, legs, and core. In sitting pt has 0/10 NPS. With household distance pt has 4-5/10 NPS but is instantly relieved with sitting. With long walks, from the clinic to the car (100 yds)  8-9/10 NPS. The pain is described as dull pain. Pt denies radicular symptoms. [name] LBP is located acros his belt line right above his buttocks. Pt reports difficulty with his balance and asc/desc stairs but at home he uses no AD at home. Pt does report a fall 1.5 months ago with his cane and fell forward onto his head where he reports he could not catch himself going forward where he hit his head. This is his only fall with no injury. [name] big goal with PT is to be able to indep replace your deck.    How long can you sit comfortably? unlimited    How long can you stand comfortably? 4-5 min    How long can you walk comfortably? 4-5 min    Patient Stated Goals Indep rebuild deck.    Currently in Pain? Yes    Pain Score 4     Pain Location Leg    Pain Orientation Left    Pain Descriptors / Indicators Sore    Pain Type Chronic pain    Pain Onset More than a month ago    Pain Frequency Intermittent              TREATMENT   Ther-ex SciFit L5 x 5 minutes for warm-up during history (17mnutes unbilled); Hooklying marchesx 20 BLE; Hooklying ant/post pelvic tilts 2s hold in each direction x 20 each; Hooklyingadductorsqueezex20BLE, with manual resistance; Side-lying clams with manual resistance from therapist x20 BLE; Hooklying bridges 2 x 10; Supine straight leg hip abductionand adduction with manual resistance fromm therapist x 10BLE each; Attempted SLR however increases pain in L hip so  discontinued; Seated cane chest press and row with manual resistance from therapist x 10 each; Seated cane arms at 90 flexion superior and inferior isometric resistance x 10 each direction for abdominal and lumbar strengthening; Seated vertical cane position with hands together and therapist providing right and left resistance to challenge oblique abdominal strengthening 3s hold x 10 each direction;   Manual Therapy  Single knee-to-chest stretch 30s hold BLE; Hip IR/ER stretch 30 sec. hold BLE; Proximal and distal hamstring stretch 30 sec. hold each BLE;   Pt educated throughout session about proper posture and technique with exercises. Improved exercise technique, movement at target joints, use of target muscles after  min to mod verbal, visual, tactile cues.    Patient demonstrates good motivation during session today.  He is reporting increased soreness in left quad again upon arrival today. Continued with some light stretching in bilateral lower extremities during session today and also repeated some light STM with Thera Roller. Patient is able to get back to exercises today without any reported increase in back pain. His energy level has improved since starting his new anit-hypertensive agent. Patient again reports improvement in his legt thigh soreness at end of session. Patient encouraged to continue HEP and follow-up as scheduled. He has yet to achieve maximal benefit from PT services. Patient will continue benefit from skilled PT services to addressdeficits in strength and pain in order to return to full function at home.                             PT Long Term Goals - 01/29/20 1304      PT LONG TERM GOAL #1   Title Pt will improve FOTO score to target goal (49) to demonstrate clinically significant improvement in functional mobility.    Baseline 01/29/20: 45; 01/06/20: 38;    Time 8    Period Weeks    Status Partially Met    Target Date 03/25/20       PT LONG TERM GOAL #2   Title Pt will improve 5xSTS score by at least 5 sec with no UE support to demonstrate clinically significant improvement in BLE strength.    Baseline 01/29/20: 15.9s 11/18: 27 sec with BUE suppport    Time 8    Period Weeks    Status Achieved    Target Date 03/25/20      PT LONG TERM GOAL #3   Title Pt will improve 2 MWT with no AD by 40' to demonstrate clinically significant improvement in endurance to improve household and community ambulation.    Baseline 01/29/20: Deferred due to elevated resting BP; 11/18: 175' no AD.    Time 8    Period Weeks    Status Deferred    Target Date 03/25/20      PT LONG TERM GOAL #4   Title Pt will ambulate with LRAD with normalized gait pattern > 5 min to reduce tripping hazard with shuffling gait.    Baseline 11/18: Pt amb with single LSC and shuffling gait after 2 min due to fatigue,    Time 8    Period Weeks    Status Deferred    Target Date 03/25/20                 Plan - 02/17/20 1301    Clinical Impression Statement Patient demonstrates good motivation during session today.  He is reporting increased soreness in left quad again upon arrival today. Continued with some light stretching in bilateral lower extremities during session today and also repeated some light STM with Thera Roller. Patient is able to get back to exercises today without any reported increase in back pain. His energy level has improved since starting his new anit-hypertensive agent. Patient again reports improvement in his left thigh soreness at end of session. Patient encouraged to continue HEP and follow-up as scheduled.  He has yet to achieve maximal benefit from PT services.  Patient will continue benefit from skilled PT services to address deficits in strength and pain in order to return to full function at home.    Personal Factors and Comorbidities  Age;Comorbidity 3+;Education;Past/Current Experience;Time since onset of  injury/illness/exacerbation    Comorbidities spinal cord stimulator, paroxysmal A-fib, DM-Type 2, HTN, chronic kidney disease.    Examination-Activity Limitations Bed Mobility;Stand;Locomotion Level;Toileting;Bend;Reach Overhead;Carry;Squat;Hygiene/Grooming;Stairs    Examination-Participation Restrictions Occupation;Community Activity;Yard Work    Stability/Clinical Decision Making Unstable/Unpredictable    Rehab Potential Fair    PT Frequency 2x / week    PT Duration 8 weeks    PT Treatment/Interventions ADLs/Self Care Home Management;Moist Heat;Gait training;Stair training;Therapeutic activities;Therapeutic exercise;Balance training;Neuromuscular re-education;Patient/family education;Manual techniques;Functional mobility training;Cryotherapy    PT Next Visit Plan Reassess HEP, Improve LE strength and endurance.    PT Home Exercise Plan spinal cord stimulator, paroxysmal A-fib, DM-Type 2, HTN, chronic kidney disease.    Consulted and Agree with Plan of Care Patient           Patient will benefit from skilled therapeutic intervention in order to improve the following deficits and impairments:  Abnormal gait,Pain,Improper body mechanics,Decreased mobility,Postural dysfunction,Decreased activity tolerance,Decreased endurance,Decreased range of motion,Decreased strength,Decreased balance,Difficulty walking,Impaired flexibility  Visit Diagnosis: Chronic bilateral low back pain without sciatica  Muscle weakness (generalized)     Problem List Patient Active Problem List   Diagnosis Date Noted  . Spinal cord stimulator status 12/11/2019  . History of 2019 novel coronavirus disease (COVID-19) 10/02/2019  . Urinary retention 08/01/2019  . CKD (chronic kidney disease) stage 3, GFR 30-59 ml/min (HCC) 01/19/2019  . Acquired thrombophilia ([place]) 01/19/2019  . 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 Postlaminectomy syndrome, lumbar region 01/16/2019  . History of fusion of lumbar spine  (L2-L5) 01/16/2019  . Chronic radicular lumbar pain 01/16/2019  . HNP (herniated nucleus pulposus), lumbar 04/29/2018  . Advanced care planning/counseling discussion 09/28/2016  . Spinal stenosis, lumbar region, 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/13/2016  . Hyperlipidemia associated with type 2 diabetes mellitus ([place]) 07/14/2015  . Anemia 06/30/2015  . 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 OSA (obstructive sleep apnea) 03/23/2015  . Type 2 diabetes mellitus with diabetic chronic kidney disease ([place]) 01/05/2015  . Hypertension associated with diabetes ([place]) 09/28/2014  . 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 Arthritis 10/20/2011  . H/O prior ablation treatment 10/19/2011  . Paroxysmal atrial fibrillation ([place]) 10/19/2011    Phillips Grout PT, DPT, GCS  Zabdiel Dripps 02/17/2020, 4:53 PM  [place] [facility] [address] [place], [place], [address] Phone: [phone]   Fax:  [phone]  Name: [name]. MRN: [id number] Date of Birth: [birth year]

## 2020-02-19 ENCOUNTER — Ambulatory Visit: Payer: Medicare Other

## 2020-02-24 ENCOUNTER — Ambulatory Visit: Payer: Medicare Other

## 2020-02-24 ENCOUNTER — Other Ambulatory Visit: Payer: Self-pay

## 2020-02-24 VITALS — BP 157/81 | HR 58

## 2020-02-24 DIAGNOSIS — M545 Low back pain, unspecified: Secondary | ICD-10-CM | POA: Diagnosis not present

## 2020-02-24 DIAGNOSIS — R2689 Other abnormalities of gait and mobility: Secondary | ICD-10-CM

## 2020-02-24 DIAGNOSIS — G8929 Other chronic pain: Secondary | ICD-10-CM

## 2020-02-24 DIAGNOSIS — M6281 Muscle weakness (generalized): Secondary | ICD-10-CM

## 2020-02-24 NOTE — Therapy (Signed)
[facility] [address] [place], [place], [address] Phone: [phone]   Fax:  [phone]  Physical Therapy Treatment  Patient Details  Name: Sean Reyes. MRN: [id number] Date of Birth: November 06, 1940 Referring Provider (PT): Gillis Santa, MD   Encounter Date: 02/24/2020   PT End of Session - 02/24/20 1307    Visit Number 11    Number of Visits 25    Date for PT Re-Evaluation 03/25/20    Authorization Type eval: 01/01/20    PT Start Time 1300    PT Stop Time 1345    PT Time Calculation (min) 45 min    Activity Tolerance Patient tolerated treatment well    Behavior During Therapy [facility] for tasks assessed/performed           Past Medical History:  Diagnosis Date  . Anemia   . Anxiety   . Arthritis   . Atrial fibrillation ([place])   . Cataracts, bilateral   . Complication of anesthesia    pt reports low BP's after surgery at [facility] and difficulty awakening  . Depression   . Diabetes ([place])    dx 6-8 yrs ago  . Dysrhythmia    a-fib  . GERD (gastroesophageal reflux disease)    OCC TAKES ALKA SELTZER  . History of kidney stones    10-15 yrs ago  . 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 Hyperlipidemia   . Hypertension   . Nocturia   . S/P ablation of atrial fibrillation    Ablative therapy  . Sleep apnea    CPAP   . Tachycardia, unspecified     Past Surgical History:  Procedure Laterality Date  . ABLATION    . ANTERIOR LAT LUMBAR FUSION N/A 06/27/2017   Procedure: Anterior Lateral Lumbar Interbody  Fusion - Lumbar Two-Lumbar Three - Lumbar Three-Lumbar Four, Posterior Lumbar Interbody Fusion Lumbar Four- Five;  Surgeon: Kary Kos, MD;  Location: [place];  Service: Neurosurgery;  Laterality: N/A;  Anterior Lateral Lumbar Interbody  Fusion - Lumbar Two-Lumbar Three - Lumbar Three-Lumbar Four, Posterior Lumbar Interbody Fusion Lumbar Four- Five  . BACK SURGERY    . CARDIOVERSION N/A 08/29/2018    Procedure: CARDIOVERSION;  Surgeon: Corey Skains, MD;  Location: [facility];  Service: Cardiovascular;  Laterality: N/A;  . CARDIOVERSION N/A 09/24/2018   Procedure: CARDIOVERSION;  Surgeon: Corey Skains, MD;  Location: [facility];  Service: Cardiovascular;  Laterality: N/A;  . COLONOSCOPY WITH PROPOFOL N/A 10/05/2015   Procedure: COLONOSCOPY WITH PROPOFOL;  Surgeon: Lollie Sails, MD;  Location: [facility] ENDOSCOPY;  Service: Endoscopy;  Laterality: N/A;  . ESOPHAGOGASTRODUODENOSCOPY (EGD) WITH PROPOFOL N/A 04/01/2018   Procedure: ESOPHAGOGASTRODUODENOSCOPY (EGD) WITH PROPOFOL;  Surgeon: Lollie Sails, MD;  Location: [facility] ENDOSCOPY;  Service: Endoscopy;  Laterality: N/A;  . HERNIA REPAIR    . JOINT REPLACEMENT Bilateral    hips  RT+  LEFT X2   . LUMBAR LAMINECTOMY/DECOMPRESSION MICRODISCECTOMY Left 09/13/2016   Procedure: Microdiscectomy - Lumbar two-three,  Lumbar three- - left;  Surgeon: Kary Kos, MD;  Location: [place];  Service: Neurosurgery;  Laterality: Left;  . SPINAL CORD STIMULATOR INSERTION  07/08/2019  . TONSILLECTOMY      Vitals:   02/24/20 1304  BP: (!) 157/81  Pulse: (!) 58  SpO2: 99%     Subjective Assessment - 02/24/20 1302    Subjective Pt reports that he is doing well today. Energy is improved. Denies any  pain today including no L thigh soreness. Denies any falls since last therapy session. No specific questions upon arrival.    Pertinent History Pt is a 80 y.o. male referred to PT for LBP. Pt has extensive PMH of failed back surgical syndrome, postlaminectomy syndrome, chronic radicular pain, hx of lumbar fusion spine, spinal cord stimulator, paroxysmal A-fib, DM-Type 2, HTN, chronic kidney disease. Pt reports 4 fusions on lumbar spine due to protruding disks which was ~2 years ago. Pt reports that his spine stimulator has started to help some but his biggest issue is his decreased strength in his spine, legs, and core. In sitting pt has 0/10 NPS. With household  distance pt has 4-5/10 NPS but is instantly relieved with sitting. With long walks, from the clinic to the car (100 yds)  8-9/10 NPS. The pain is described as dull pain. Pt denies radicular symptoms. [name] LBP is located acros his belt line right above his buttocks. Pt reports difficulty with his balance and asc/desc stairs but at home he uses no AD at home. Pt does report a fall 1.5 months ago with his cane and fell forward onto his head where he reports he could not catch himself going forward where he hit his head. This is his only fall with no injury. [name] big goal with PT is to be able to indep replace your deck.    How long can you sit comfortably? unlimited    How long can you stand comfortably? 4-5 min    How long can you walk comfortably? 4-5 min    Patient Stated Goals Indep rebuild deck.    Currently in Pain? No/denies    Pain Onset --                TREATMENT   Ther-ex SciFit L2-5 x 44mnutes for warm-up during history with therapist adjusting resistance, seat position 12, arms 10 (212mutes unbilled); Hooklying marchesx 20 BLE; Hooklyingadductorsqueezewith manual resistance from therapist x 20BLE; Hooklying clams with manual resistance from therapist x 20 BLE; Hooklying bridges 2 x 10; SLR x 20 BLE, more difficult with LLE; Supine straight leg hip abductionand adduction with manual resistance fromm therapist x 15LLE, x 20 RLE; Supine heel sides with manually resisted extension x 20 BLE; Seated cane chest press and row with manual resistance from therapist x 10 each; Standing alternating 6" step taps without UE support x 10 each; Step-ups without UE support alternating leading LE x 10 each; Sit to stand from elevated mat table without UE assist x 10; Seated cane arms at 90 flexion superior and inferior isometric resistance x 10 each direction for abdominal and lumbar strengthening;   Manual Therapy Single knee-to-chest stretch 30shold BLE; Hip IR/ER  stretch 30 sec. hold BLE; Proximal and distal hamstring stretch 30 sec. hold each BLE;   Pt educated throughout session about proper posture and technique with exercises. Improved exercise technique, movement at target joints, use of target muscles after min to mod verbal, visual, tactile cues.    Patient demonstrates good motivation during session today.No pain reported during session.  Patient is able to perform exercises without any reported increase in back pain. His energy level has improved as well and no signs of excessive fatigue today.  Will progress seated and standing strengthening exercises for functional carryover in future sessions as patient is able to tolerate.  Patient encouraged to continue HEP and follow-up as scheduled. He has yet to achieve maximal benefit from PT services. Patient will continue benefit from  skilled PT services to addressdeficits in strength and pain in order to return to full function at home.                           PT Long Term Goals - 01/29/20 1304      PT LONG TERM GOAL #1   Title Pt will improve FOTO score to target goal (49) to demonstrate clinically significant improvement in functional mobility.    Baseline 01/29/20: 45; 01/06/20: 38;    Time 8    Period Weeks    Status Partially Met    Target Date 03/25/20      PT LONG TERM GOAL #2   Title Pt will improve 5xSTS score by at least 5 sec with no UE support to demonstrate clinically significant improvement in BLE strength.    Baseline 01/29/20: 15.9s 11/18: 27 sec with BUE suppport    Time 8    Period Weeks    Status Achieved    Target Date 03/25/20      PT LONG TERM GOAL #3   Title Pt will improve 2 MWT with no AD by 40' to demonstrate clinically significant improvement in endurance to improve household and community ambulation.    Baseline 01/29/20: Deferred due to elevated resting BP; 11/18: 175' no AD.    Time 8    Period Weeks    Status Deferred     Target Date 03/25/20      PT LONG TERM GOAL #4   Title Pt will ambulate with LRAD with normalized gait pattern > 5 min to reduce tripping hazard with shuffling gait.    Baseline 11/18: Pt amb with single LSC and shuffling gait after 2 min due to fatigue,    Time 8    Period Weeks    Status Deferred    Target Date 03/25/20                 Plan - 02/24/20 1308    Clinical Impression Statement Patient demonstrates good motivation during session today.  No pain reported during session.  Patient is able to perform exercises without any reported increase in back pain. His energy level has improved as well and no signs of excessive fatigue today.  Will progress seated and standing strengthening exercises for functional carryover in future sessions as patient is able to tolerate.  Patient encouraged to continue HEP and follow-up as scheduled.  He has yet to achieve maximal benefit from PT services.  Patient will continue benefit from skilled PT services to address deficits in strength and pain in order to return to full function at home.    Personal Factors and Comorbidities Age;Comorbidity 3+;Education;Past/Current Experience;Time since onset of injury/illness/exacerbation    Comorbidities spinal cord stimulator, paroxysmal A-fib, DM-Type 2, HTN, chronic kidney disease.    Examination-Activity Limitations Bed Mobility;Stand;Locomotion Level;Toileting;Bend;Reach Overhead;Carry;Squat;Hygiene/Grooming;Stairs    Examination-Participation Restrictions Occupation;Community Activity;Yard Work    Stability/Clinical Decision Making Unstable/Unpredictable    Rehab Potential Fair    PT Frequency 2x / week    PT Duration 8 weeks    PT Treatment/Interventions ADLs/Self Care Home Management;Moist Heat;Gait training;Stair training;Therapeutic activities;Therapeutic exercise;Balance training;Neuromuscular re-education;Patient/family education;Manual techniques;Functional mobility training;Cryotherapy    PT  Next Visit Plan Reassess HEP, Improve LE strength and endurance.    PT Home Exercise Plan spinal cord stimulator, paroxysmal A-fib, DM-Type 2, HTN, chronic kidney disease.    Consulted and Agree with Plan of Care Patient  Patient will benefit from skilled therapeutic intervention in order to improve the following deficits and impairments:  Abnormal gait,Pain,Improper body mechanics,Decreased mobility,Postural dysfunction,Decreased activity tolerance,Decreased endurance,Decreased range of motion,Decreased strength,Decreased balance,Difficulty walking,Impaired flexibility  Visit Diagnosis: Chronic bilateral low back pain without sciatica  Muscle weakness (generalized)  Other abnormalities of gait and mobility     Problem List Patient Active Problem List   Diagnosis Date Noted  . Spinal cord stimulator status 12/11/2019  . History of 2019 novel coronavirus disease (COVID-19) 10/02/2019  . Urinary retention 08/01/2019  . CKD (chronic kidney disease) stage 3, GFR 30-59 ml/min (HCC) 01/19/2019  . Acquired thrombophilia ([place]) 01/19/2019  . 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 Postlaminectomy syndrome, lumbar region 01/16/2019  . History of fusion of lumbar spine (L2-L5) 01/16/2019  . Chronic radicular lumbar pain 01/16/2019  . HNP (herniated nucleus pulposus), lumbar 04/29/2018  . Advanced care planning/counseling discussion 09/28/2016  . Spinal stenosis, lumbar region, 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/13/2016  . Hyperlipidemia associated with type 2 diabetes mellitus ([place]) 07/14/2015  . Anemia 06/30/2015  . 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 OSA (obstructive sleep apnea) 03/23/2015  . Type 2 diabetes mellitus with diabetic chronic kidney disease ([place]) 01/05/2015  . Hypertension associated with diabetes ([place]) 09/28/2014  . 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[place]) 09/28/2014  . Arthritis 10/20/2011  . H/O prior ablation treatment  10/19/2011  . Paroxysmal atrial fibrillation (HCC) 10/19/2011   Phillips Grout PT, DPT, GCS  Delayna Sparlin 02/24/2020, 4:36 PM  [facility] [address] [place], [place], [address] Phone: [phone]   Fax:  [phone]  Name: Sean Reyes. MRN: [id number] Date of Birth: 1940/04/15

## 2020-02-26 ENCOUNTER — Other Ambulatory Visit: Payer: Self-pay

## 2020-02-26 ENCOUNTER — Ambulatory Visit: Payer: Medicare Other

## 2020-02-26 ENCOUNTER — Encounter: Payer: Self-pay | Admitting: Nurse Practitioner

## 2020-02-26 ENCOUNTER — Ambulatory Visit (INDEPENDENT_AMBULATORY_CARE_PROVIDER_SITE_OTHER): Payer: Medicare Other | Admitting: Nurse Practitioner

## 2020-02-26 VITALS — BP 117/74 | HR 54 | Temp 98.1°F | Ht 70.67 in | Wt 189.4 lb

## 2020-02-26 DIAGNOSIS — E785 Hyperlipidemia, unspecified: Secondary | ICD-10-CM

## 2020-02-26 DIAGNOSIS — G8929 Other chronic pain: Secondary | ICD-10-CM

## 2020-02-26 DIAGNOSIS — D649 Anemia, unspecified: Secondary | ICD-10-CM | POA: Diagnosis not present

## 2020-02-26 DIAGNOSIS — E1122 Type 2 diabetes mellitus with diabetic chronic kidney disease: Secondary | ICD-10-CM

## 2020-02-26 DIAGNOSIS — D6869 Other thrombophilia: Secondary | ICD-10-CM

## 2020-02-26 DIAGNOSIS — E1159 Type 2 diabetes mellitus with other circulatory complications: Secondary | ICD-10-CM

## 2020-02-26 DIAGNOSIS — M545 Low back pain, unspecified: Secondary | ICD-10-CM | POA: Diagnosis not present

## 2020-02-26 DIAGNOSIS — E1169 Type 2 diabetes mellitus with other specified complication: Secondary | ICD-10-CM | POA: Diagnosis not present

## 2020-02-26 DIAGNOSIS — Z1159 Encounter for screening for other viral diseases: Secondary | ICD-10-CM | POA: Diagnosis not present

## 2020-02-26 DIAGNOSIS — I152 Hypertension secondary to endocrine disorders: Secondary | ICD-10-CM | POA: Diagnosis not present

## 2020-02-26 DIAGNOSIS — E559 Vitamin D deficiency, unspecified: Secondary | ICD-10-CM | POA: Diagnosis not present

## 2020-02-26 DIAGNOSIS — N401 Enlarged prostate with lower urinary tract symptoms: Secondary | ICD-10-CM

## 2020-02-26 DIAGNOSIS — M961 Postlaminectomy syndrome, not elsewhere classified: Secondary | ICD-10-CM | POA: Diagnosis not present

## 2020-02-26 DIAGNOSIS — N1831 Chronic kidney disease, stage 3a: Secondary | ICD-10-CM | POA: Diagnosis not present

## 2020-02-26 DIAGNOSIS — N138 Other obstructive and reflux uropathy: Secondary | ICD-10-CM

## 2020-02-26 DIAGNOSIS — E1143 Type 2 diabetes mellitus with diabetic autonomic (poly)neuropathy: Secondary | ICD-10-CM | POA: Diagnosis not present

## 2020-02-26 DIAGNOSIS — I480 Paroxysmal atrial fibrillation: Secondary | ICD-10-CM

## 2020-02-26 DIAGNOSIS — R2689 Other abnormalities of gait and mobility: Secondary | ICD-10-CM

## 2020-02-26 DIAGNOSIS — Z9689 Presence of other specified functional implants: Secondary | ICD-10-CM | POA: Diagnosis not present

## 2020-02-26 DIAGNOSIS — M6281 Muscle weakness (generalized): Secondary | ICD-10-CM

## 2020-02-26 LAB — MICROALBUMIN, URINE WAIVED
Creatinine, Urine Waived: 200 mg/dL (ref 10–300)
Microalb, Ur Waived: 80 mg/L — ABNORMAL HIGH (ref 0–19)

## 2020-02-26 LAB — BAYER DCA HB A1C WAIVED: HB A1C (BAYER DCA - WAIVED): 6.6 % (ref <7.0)

## 2020-02-26 NOTE — Assessment & Plan Note (Signed)
Chronic pain related to this.  Continue collaboration with pain management and Gabapentin BID.  Is benefiting from PT at this time.

## 2020-02-26 NOTE — Assessment & Plan Note (Signed)
Patient on Eliquis with A-fib, monitor CBC regularly. 

## 2020-02-26 NOTE — Assessment & Plan Note (Signed)
Chronic, stable with BP at goal for age in office and on home readings.  A1C 6.6% today.  Continue current medication regimen and collaboration with cardiology.  Recommend he continue to check BP at home at least 3 mornings a week and document.  BMP & TSH today.  Return in 3 months.

## 2020-02-26 NOTE — Assessment & Plan Note (Signed)
Chronic, ongoing.  Continue current medication regimen, tolerating 5 MG Crestor well without ADR, and adjust as needed.  Lipid panel today.  Return in 3 months.

## 2020-02-26 NOTE — Assessment & Plan Note (Signed)
CBC today, patient on Eliquis.  Some improvement on recent labs -- is post surgical -- risk for bleeding. 

## 2020-02-26 NOTE — Assessment & Plan Note (Signed)
Chronic, ongoing.  Continue current medication regimen and collaboration with cardiology.  Monitor CBC closely due to anemia.

## 2020-02-26 NOTE — Therapy (Signed)
[facility] [address]. [place], [place], [address] Phone: [phone]   Fax:  [phone]  Physical Therapy Treatment  Patient Details  Name: Sean Reyes. MRN: [id number] Date of Birth: 30-May-1940 Referring Provider (PT): Gillis Santa, MD   Encounter Date: 02/26/2020   PT End of Session - 02/26/20 1711    Visit Number 12    Number of Visits 25    Date for PT Re-Evaluation 03/25/20    Authorization Type eval: 01/01/20    PT Start Time 1320    PT Stop Time 1345    PT Time Calculation (min) 25 min    Activity Tolerance Patient tolerated treatment well    Behavior During Therapy [facility] for tasks assessed/performed           Past Medical History:  Diagnosis Date  . Anemia   . Anxiety   . Arthritis   . Atrial fibrillation ([place])   . Cataracts, bilateral   . Complication of anesthesia    pt reports low BP's after surgery at [facility] and difficulty awakening  . Depression   . Diabetes ([place])    dx 6-8 yrs ago  . Dysrhythmia    a-fib  . GERD (gastroesophageal reflux disease)    OCC TAKES ALKA SELTZER  . History of kidney stones    10-15 yrs ago  . 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 Hyperlipidemia   . Hypertension   . Nocturia   . S/P ablation of atrial fibrillation    Ablative therapy  . Sleep apnea    CPAP   . Tachycardia, unspecified     Past Surgical History:  Procedure Laterality Date  . ABLATION    . ANTERIOR LAT LUMBAR FUSION N/A 06/27/2017   Procedure: Anterior Lateral Lumbar Interbody  Fusion - Lumbar Two-Lumbar Three - Lumbar Three-Lumbar Four, Posterior Lumbar Interbody Fusion Lumbar Four- Five;  Surgeon: Kary Kos, MD;  Location: [place];  Service: Neurosurgery;  Laterality: N/A;  Anterior Lateral Lumbar Interbody  Fusion - Lumbar Two-Lumbar Three - Lumbar Three-Lumbar Four, Posterior Lumbar Interbody Fusion Lumbar Four- Five  . BACK SURGERY    . CARDIOVERSION N/A 08/29/2018    Procedure: CARDIOVERSION;  Surgeon: Corey Skains, MD;  Location: [facility];  Service: Cardiovascular;  Laterality: N/A;  . CARDIOVERSION N/A 09/24/2018   Procedure: CARDIOVERSION;  Surgeon: Corey Skains, MD;  Location: [facility];  Service: Cardiovascular;  Laterality: N/A;  . COLONOSCOPY WITH PROPOFOL N/A 10/05/2015   Procedure: COLONOSCOPY WITH PROPOFOL;  Surgeon: Lollie Sails, MD;  Location: [facility];  Service: Endoscopy;  Laterality: N/A;  . ESOPHAGOGASTRODUODENOSCOPY (EGD) WITH PROPOFOL N/A 04/01/2018   Procedure: ESOPHAGOGASTRODUODENOSCOPY (EGD) WITH PROPOFOL;  Surgeon: Lollie Sails, MD;  Location: [facility] ENDOSCOPY;  Service: Endoscopy;  Laterality: N/A;  . HERNIA REPAIR    . JOINT REPLACEMENT Bilateral    hips  RT+  LEFT X2   . LUMBAR LAMINECTOMY/DECOMPRESSION MICRODISCECTOMY Left 09/13/2016   Procedure: Microdiscectomy - Lumbar two-three,  Lumbar three- - left;  Surgeon: Kary Kos, MD;  Location: [place];  Service: Neurosurgery;  Laterality: Left;  . SPINAL CORD STIMULATOR INSERTION  07/08/2019  . TONSILLECTOMY      There were no vitals filed for this visit.   Subjective Assessment - 02/26/20 1319    Subjective Pt reports that he is doing well today. Saw PCP today and states that he had a good report.  Denies any pain  today but does reports some low back fatigue. Denies any falls since last therapy session. No specific questions upon arrival.    Pertinent History Pt is a 80 y.o. male referred to PT for LBP. Pt has extensive PMH of failed back surgical syndrome, postlaminectomy syndrome, chronic radicular pain, hx of lumbar fusion spine, spinal cord stimulator, paroxysmal A-fib, DM-Type 2, HTN, chronic kidney disease. Pt reports 4 fusions on lumbar spine due to protruding disks which was ~2 years ago. Pt reports that his spine stimulator has started to help some but his biggest issue is his decreased strength in his spine, legs, and core. In sitting pt has 0/10 NPS. With  household distance pt has 4-5/10 NPS but is instantly relieved with sitting. With long walks, from the clinic to the car (100 yds)  8-9/10 NPS. The pain is described as dull pain. Pt denies radicular symptoms. [name] LBP is located acros his belt line right above his buttocks. Pt reports difficulty with his balance and asc/desc stairs but at home he uses no AD at home. Pt does report a fall 1.5 months ago with his cane and fell forward onto his head where he reports he could not catch himself going forward where he hit his head. This is his only fall with no injury. [name] big goal with PT is to be able to indep replace your deck.    How long can you sit comfortably? unlimited    How long can you stand comfortably? 4-5 min    How long can you walk comfortably? 4-5 min    Patient Stated Goals Indep rebuild deck.    Currently in Pain? No/denies               TREATMENT   Ther-ex Hooklying marches2 x 20 BLE; Hooklyingadductorsqueezewith manual resistance from therapist x 20BLE; Hooklying clams with manual resistance from therapist x 20 BLE; Hooklying bridgesx 20; SLR x 9 LLE, discontinued secondary to pain, x 20 RLE; Supine straight leg hip abductionand adduction with manual resistance fromm therapist x 15BLE; Supine heel sides with manually resisted extension x 20 BLE; Standing alternating 6" step taps without UE support x 10 each; Step-ups without UE support alternating leading LE x 10 each; Sit to stand from elevated mat table without UE assist x 10; NuStep L2-4 with pt adjusting resistancex 26mnutes at end of session, seat position 13, arms 10 (unbilled);   Manual Therapy Single knee-to-chest stretch 30shold BLE; Hip IR/ER stretch 30 sec. hold each BLE; Hamstring stretch 30 sec. hold each BLE; Rest breaks provided between stretches;   Pt educated throughout session about proper posture and technique with exercises. Improved exercise technique, movement at target  joints, use of target muscles after min to mod verbal, visual, tactile cues.    Patient demonstrates good motivation during session today.No pain reported during session.    He arrived late so session was abbreviated accordingly.  Patient finished on the NuStep after end of session for an additional 6 minutes which was unbilled.  Patient is able to perform exercises without any reported increase in back pain. His energy and strength continue to improve with each progressive session. Will progress seated and standing strengthening exercises for functional carryover in future sessions as patient is able to tolerate. Patient encouraged to continue HEP and follow-up as scheduled. He has yet to achieve maximal benefit from PT services. Patient will continue benefit from skilled PT services to addressdeficits in strength and pain in order to return to full function  at home.                             PT Long Term Goals - 01/29/20 1304      PT LONG TERM GOAL #1   Title Pt will improve FOTO score to target goal (49) to demonstrate clinically significant improvement in functional mobility.    Baseline 01/29/20: 45; 01/06/20: 38;    Time 8    Period Weeks    Status Partially Met    Target Date 03/25/20      PT LONG TERM GOAL #2   Title Pt will improve 5xSTS score by at least 5 sec with no UE support to demonstrate clinically significant improvement in BLE strength.    Baseline 01/29/20: 15.9s 11/18: 27 sec with BUE suppport    Time 8    Period Weeks    Status Achieved    Target Date 03/25/20      PT LONG TERM GOAL #3   Title Pt will improve 2 MWT with no AD by 40' to demonstrate clinically significant improvement in endurance to improve household and community ambulation.    Baseline 01/29/20: Deferred due to elevated resting BP; 11/18: 175' no AD.    Time 8    Period Weeks    Status Deferred    Target Date 03/25/20      PT LONG TERM GOAL #4   Title Pt will  ambulate with LRAD with normalized gait pattern > 5 min to reduce tripping hazard with shuffling gait.    Baseline 11/18: Pt amb with single LSC and shuffling gait after 2 min due to fatigue,    Time 8    Period Weeks    Status Deferred    Target Date 03/25/20                 Plan - 02/26/20 1712    Clinical Impression Statement Patient demonstrates good motivation during session today.  No pain reported during session.    He arrived late so session was abbreviated accordingly.  Patient finished on the NuStep after end of session for an additional 6 minutes which was unbilled.  Patient is able to perform exercises without any reported increase in back pain. His energy and strength continue to improve with each progressive session. Will progress seated and standing strengthening exercises for functional carryover in future sessions as patient is able to tolerate. Patient encouraged to continue HEP and follow-up as scheduled.  He has yet to achieve maximal benefit from PT services.  Patient will continue benefit from skilled PT services to address deficits in strength and pain in order to return to full function at home.    Personal Factors and Comorbidities Age;Comorbidity 3+;Education;Past/Current Experience;Time since onset of injury/illness/exacerbation    Comorbidities spinal cord stimulator, paroxysmal A-fib, DM-Type 2, HTN, chronic kidney disease.    Examination-Activity Limitations Bed Mobility;Stand;Locomotion Level;Toileting;Bend;Reach Overhead;Carry;Squat;Hygiene/Grooming;Stairs    Examination-Participation Restrictions Occupation;Community Activity;Yard Work    Stability/Clinical Decision Making Unstable/Unpredictable    Rehab Potential Fair    PT Frequency 2x / week    PT Duration 8 weeks    PT Treatment/Interventions ADLs/Self Care Home Management;Moist Heat;Gait training;Stair training;Therapeutic activities;Therapeutic exercise;Balance training;Neuromuscular  re-education;Patient/family education;Manual techniques;Functional mobility training;Cryotherapy    PT Next Visit Plan Reassess HEP, Improve LE strength and endurance.    PT Home Exercise Plan spinal cord stimulator, paroxysmal A-fib, DM-Type 2, HTN, chronic kidney disease.    Consulted and Agree  with Plan of Care Patient           Patient will benefit from skilled therapeutic intervention in order to improve the following deficits and impairments:  Abnormal gait,Pain,Improper body mechanics,Decreased mobility,Postural dysfunction,Decreased activity tolerance,Decreased endurance,Decreased range of motion,Decreased strength,Decreased balance,Difficulty walking,Impaired flexibility  Visit Diagnosis: Chronic bilateral low back pain without sciatica  Muscle weakness (generalized)  Other abnormalities of gait and mobility     Problem List Patient Active Problem List   Diagnosis Date Noted  . Spinal cord stimulator status 12/11/2019  . History of 2019 novel coronavirus disease (COVID-19) 10/02/2019  . CKD (chronic kidney disease) stage 3, GFR 30-59 ml/min (HCC) 01/19/2019  . Acquired thrombophilia ([place]) 01/19/2019  . 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 Postlaminectomy syndrome, lumbar region 01/16/2019  . History of fusion of lumbar spine (L2-L5) 01/16/2019  . Chronic radicular lumbar pain 01/16/2019  . HNP (herniated nucleus pulposus), lumbar 04/29/2018  . Advanced care planning/counseling discussion 09/28/2016  . Spinal stenosis, lumbar region, 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/13/2016  . Hyperlipidemia associated with type 2 diabetes mellitus ([place]) 07/14/2015  . Anemia 06/30/2015  . 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 OSA (obstructive sleep apnea) 03/23/2015  . Type 2 diabetes mellitus with diabetic chronic kidney disease ([place]) 01/05/2015  . Hypertension associated with diabetes ([place]) 09/28/2014  . Diabetes mellitus with autonomic neuropathy  ([place]) 09/28/2014  . H/O prior ablation treatment 10/19/2011  . Paroxysmal atrial fibrillation ([place]) 10/19/2011   Phillips Grout PT, DPT, GCS  Huprich,Jason 02/26/2020, 5:19 PM  [facility] [address] [place], [place], [address] Phone: [phone]   Fax:  [phone]  Name: Sean Reyes. MRN: [id number] Date of Birth: 06/02/1940

## 2020-02-26 NOTE — Progress Notes (Signed)
BP 117/74   Pulse (!) 54   Temp 98.1 F (36.7 C) (Oral)   Ht 5' 10.67" (1.795 m)   Wt 189 lb 6.4 oz (85.9 kg)   SpO2 99%   BMI 26.66 kg/m    Subjective:    Patient ID: Sean Hacker., male    DOB: 1940/08/07, 80 y.o.   MRN: [id number]  HPI: Sean Reyes. is a 80 y.o. male  Chief Complaint  Patient presents with  . Diabetes   DIABETES Recent A1C in October 6.6%.Continues on Metformin daily. Hypoglycemic episodes:no Polydipsia/polyuria:no Visual disturbance:no Chest pain:no Paresthesias:no Glucose Monitoring:yes Accucheck frequency: a couple times week Fasting glucose:120 to 130 range Post prandial: Evening: Before meals: Taking Insulin?:no Long acting insulin: Short acting insulin: Blood Pressure Monitoring:a few times a day Retinal Examination:Up to Date Foot Exam:Up to Date Pneumovax:Up to Date Influenza:Up to Date Aspirin:no  HYPERTENSION / HYPERLIPIDEMIA Followed by Dr. Nehemiah Massed for cardiology. Last seen12/30/21 with Amlodipine added to medication regimen. Continues on Eliquis, started over a year agofor atrial fibrillation. Taking Rosuvastatin 5 MG, started at recent visit and tolerating well.  Has history of Covid and received MAB infusion 10/02/19.  Satisfied with current treatment?yes Duration of hypertension:chronic BP monitoring frequency:a few times a week BP range:120/80's BP medication side effects:no Duration of hyperlipidemia:chronic Cholesterol medication side effects:no Cholesterol supplements: none Medication compliance:good compliance Aspirin:no Recent stressors:no Recurrent headaches:no Visual changes:no Palpitations:no Dyspnea:no Chest pain:no Lower extremity edema:no Dizzy/lightheaded:no The 10-year ASCVD risk score Mikey Bussing DC Jr., et al., 2013) is: 45.2%   Values used to calculate the score:     Age: 79  years     Sex: Male     Is Non-Hispanic African American: No     Diabetic: Yes     Tobacco smoker: No     Systolic Blood Pressure: 123XX123 mmHg     Is BP treated: Yes     HDL Cholesterol: 44 mg/dL     Total Cholesterol: 149 mg/dL  CHRONIC KIDNEY DISEASE October improved labsGFR62and CRT 1.13. CKD status:stable Medications renally dose:yes Previous renal evaluation:no Pneumovax:Up to Date Influenza Vaccine:Up to Date CrCl cannot be calculated (Patient's most recent lab result is older than the maximum 21 days allowed.).   CHRONIC PAIN Had thoracic spinal cord stimulator placed initially on 07/01/2019 and then permanent placement on 07/07/2019 - feels that settings are starting to be in correct place.  CBC H/H 11.9/36.1, MCV 91, B12 692, iron 72, ferritin 72 -- anemia present since his surgical procedure.  Saw Dr. Holley Raring with pain clinic on 12/11/19 and currently doing PT -- Sean Reyes reports the PT is helping.  Is being followed by urology for issues post back surgery with urination. Duration:weeks Lower extremity edema:no Arthralgias:no Myalgias:no Weakness:no Rash:no  Relevant past medical, surgical, family and social history reviewed and updated as indicated. Interim medical history since our last visit reviewed. Allergies and medications reviewed and updated.  Review of Systems  Constitutional: Negative for activity change, appetite change, diaphoresis, fatigue and fever.  Respiratory: Negative for cough, chest tightness, shortness of breath and wheezing.   Cardiovascular: Negative for chest pain, palpitations and leg swelling.  Gastrointestinal: Negative.   Endocrine: Negative for cold intolerance, heat intolerance, polydipsia, polyphagia and polyuria.  Musculoskeletal: Positive for arthralgias.  Neurological: Negative.   Psychiatric/Behavioral: Negative.     Per HPI unless specifically indicated above     Objective:    BP 117/74   Pulse (!) 54   Temp 98.1 F  (36.7 C) (Oral)  Ht 5' 10.67" (1.795 m)   Wt 189 lb 6.4 oz (85.9 kg)   SpO2 99%   BMI 26.66 kg/m   Wt Readings from Last 3 Encounters:  02/26/20 189 lb 6.4 oz (85.9 kg)  12/11/19 190 lb (86.2 kg)  11/25/19 197 lb (89.4 kg)    Physical Exam Vitals and nursing note reviewed.  Constitutional:      General: Sean Reyes is awake. Sean Reyes is not in acute distress.    Appearance: Sean Reyes is well-developed and well-groomed. Sean Reyes is not ill-appearing.  HENT:     Head: Normocephalic and atraumatic.     Right Ear: Hearing normal. No drainage.     Left Ear: Hearing normal. No drainage.  Eyes:     General: Lids are normal.        Right eye: No discharge.        Left eye: No discharge.     Conjunctiva/sclera: Conjunctivae normal.     Pupils: Pupils are equal, round, and reactive to light.  Neck:     Thyroid: No thyromegaly.     Vascular: No carotid bruit.     Trachea: Trachea normal.  Cardiovascular:     Rate and Rhythm: Normal rate and regular rhythm.     Heart sounds: Normal heart sounds, S1 normal and S2 normal. No murmur heard. No gallop.   Pulmonary:     Effort: Pulmonary effort is normal. No accessory muscle usage or respiratory distress.     Breath sounds: Normal breath sounds.  Abdominal:     General: Bowel sounds are normal. There is no distension.     Palpations: Abdomen is soft. There is no hepatomegaly.     Tenderness: There is no abdominal tenderness. There is no right CVA tenderness or left CVA tenderness.  Musculoskeletal:        General: Normal range of motion.     Cervical back: Normal range of motion and neck supple.     Right lower leg: No edema.     Left lower leg: No edema.  Lymphadenopathy:     Cervical: No cervical adenopathy.  Skin:    General: Skin is warm and dry.     Capillary Refill: Capillary refill takes less than 2 seconds.  Neurological:     Mental Status: Sean Reyes is alert and oriented to person, place, and time.     Deep Tendon Reflexes:     Reflex Scores:       Patellar reflexes are 1+ on the right side and 1+ on the left side.      Achilles reflexes are 1+ on the right side and 1+ on the left side.    Comments: Antalgic gait, no use of cane -- improving.  Psychiatric:        Attention and Perception: Attention normal.        Mood and Affect: Mood normal.        Speech: Speech normal.        Behavior: Behavior normal. Behavior is cooperative.        Thought Content: Thought content normal.    Diabetic Foot Exam - Simple   Simple Foot Form Visual Inspection No deformities, no ulcerations, no other skin breakdown bilaterally: Yes Sensation Testing See comments: Yes Pulse Check See comments: Yes Comments 1+ pulses DP and PT bilaterally.  Sensation left foot 3/10 and right foot 6/10.       Results for orders placed or performed in visit on [id number]  Basic metabolic panel  Result  Value Ref Range   Glucose 148 (H) 65 - 99 mg/dL   BUN 18 8 - 27 mg/dL   Creatinine, Ser 1.13 0.76 - 1.27 mg/dL   GFR calc non Af Amer 62 >59 mL/min/1.73   GFR calc Af Amer 72 >59 mL/min/1.73   BUN/Creatinine Ratio 16 10 - 24   Sodium 137 134 - 144 mmol/L   Potassium 4.1 3.5 - 5.2 mmol/L   Chloride 99 96 - 106 mmol/L   CO2 24 20 - 29 mmol/L   Calcium 9.7 8.6 - 10.2 mg/dL  Lipid Panel w/o Chol/HDL Ratio  Result Value Ref Range   Cholesterol, Total 149 100 - 199 mg/dL   Triglycerides 98 0 - 149 mg/dL   HDL 44 >39 mg/dL   VLDL Cholesterol Cal 18 5 - 40 mg/dL   LDL Chol Calc (NIH) 87 0 - 99 mg/dL  HgB A1c  Result Value Ref Range   Hgb A1c MFr Bld 6.6 (H) 4.8 - 5.6 %   Est. average glucose Bld gHb Est-mCnc 143 mg/dL  CBC with Differential/Platelet  Result Value Ref Range   WBC 6.5 3.4 - 10.8 x10E3/uL   RBC 3.96 (L) 4.14 - 5.80 x10E6/uL   Hemoglobin 11.9 (L) 13.0 - 17.7 g/dL   Hematocrit 36.1 (L) 37.5 - 51.0 %   MCV 91 79 - 97 fL   MCH 30.1 26.6 - 33.0 pg   MCHC 33.0 31.5 - 35.7 g/dL   RDW 14.3 11.6 - 15.4 %   Platelets 190 150 - 450 x10E3/uL    Neutrophils 60 Not Estab. %   Lymphs 25 Not Estab. %   Monocytes 14 Not Estab. %   Eos 1 Not Estab. %   Basos 0 Not Estab. %   Neutrophils Absolute 3.9 1.4 - 7.0 x10E3/uL   Lymphocytes Absolute 1.6 0.7 - 3.1 x10E3/uL   Monocytes Absolute 0.9 0.1 - 0.9 x10E3/uL   EOS (ABSOLUTE) 0.1 0.0 - 0.4 x10E3/uL   Basophils Absolute 0.0 0.0 - 0.2 x10E3/uL   Immature Granulocytes 0 Not Estab. %   Immature Grans (Abs) 0.0 0.0 - 0.1 x10E3/uL      Assessment & Plan:   Problem List Items Addressed This Visit      Cardiovascular and Mediastinum   Hypertension associated with diabetes ([place])    Chronic, stable with BP at goal for age in office and on home readings.  A1C 6.6% today.  Continue current medication regimen and collaboration with cardiology.  Recommend Sean Reyes continue to check BP at home at least 3 mornings a week and document.  BMP & TSH today.  Return in 3 months.      Relevant Medications   AMLODIPINE BESYLATE PO   Other Relevant Orders   Bayer DCA Hb A1c Waived   Basic metabolic panel   Microalbumin, Urine Waived   TSH   Paroxysmal atrial fibrillation (HCC)    Chronic, ongoing.  Continue current medication regimen and collaboration with cardiology.  Monitor CBC closely due to anemia.      Relevant Medications   AMLODIPINE BESYLATE PO     Endocrine   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HCC)    Chronic, ongoing with A1c 6.6% today.  Decreased sensation bilateral feet, monitor closely for wounds and falls.  Continue current medication regimen and adjust as needed.  Return in 3 months.      Type 2 diabetes mellitus with diabetic chronic kidney disease (HCC) - Primary    Chronic, ongoing with A1C 6.6%  today.  Continue Benazepril for kidney protection and may consider reduction of Metformin in future dependent on kidney function on labs.  Educated him on kidney disease and monitoring.  Consider referral to nephrology in future if ever any worsening of function noted -- recent labs  had improved.  Return in 3 months for T2DM check.      Relevant Orders   Bayer DCA Hb A1c Waived   Hyperlipidemia associated with type 2 diabetes mellitus (HCC)    Chronic, ongoing.  Continue current medication regimen, tolerating 5 MG Crestor well without ADR, and adjust as needed.  Lipid panel today.  Return in 3 months.      Relevant Orders   Bayer DCA Hb A1c Waived   Lipid Panel w/o Chol/HDL Ratio     Genitourinary   BPH with obstruction/lower urinary tract symptoms    Chronic, ongoing, currently seeing urology.  Continue current medication regimen and collaboration.      CKD (chronic kidney disease) stage 3, GFR 30-59 ml/min (HCC)    Chronic, ongoing.  Continue to monitor closely and refer to nephrology if decline -- recent labs had improved.  Continue Benazepril for kidney protection.  Renal dose medications as needed based on labs.        Hematopoietic and Hemostatic   Acquired thrombophilia ([place])    Patient on Eliquis with A-fib, monitor CBC regularly.        Other   Anemia    CBC today, patient on Eliquis.  Some improvement on recent labs -- is post surgical -- risk for bleeding.      Relevant Orders   CBC with Differential/Platelet   Postlaminectomy syndrome, lumbar region    Chronic pain related to this.  Continue collaboration with pain management and Gabapentin BID.  Is benefiting from PT at this time.      Spinal cord stimulator status    Continue collaboration with pain management.       Other Visit Diagnoses    Vitamin D deficiency       History of low levels reported, check Vit D level today and initiate supplement as needed.   Relevant Orders   VITAMIN D 25 Hydroxy (Vit-D Deficiency, Fractures)   Need for hepatitis C screening test       Hep C on screening today   Relevant Orders   Hepatitis C antibody       Follow up plan: Return in about 3 months (around 05/26/2020) for T2DM, HTN/HLD, A-FIB.

## 2020-02-26 NOTE — Patient Instructions (Signed)
Diabetes Mellitus and Nutrition, Adult When you have diabetes, or diabetes mellitus, it is very important to have healthy eating habits because your blood sugar (glucose) levels are greatly affected by what you eat and drink. Eating healthy foods in the right amounts, at about the same times every day, can help you:  Control your blood glucose.  Lower your risk of heart disease.  Improve your blood pressure.  Reach or maintain a healthy weight. What can affect my meal plan? Every person with diabetes is different, and each person has different needs for a meal plan. Your health care provider may recommend that you work with a dietitian to make a meal plan that is best for you. Your meal plan may vary depending on factors such as:  The calories you need.  The medicines you take.  Your weight.  Your blood glucose, blood pressure, and cholesterol levels.  Your activity level.  Other health conditions you have, such as heart or kidney disease. How do carbohydrates affect me? Carbohydrates, also called carbs, affect your blood glucose level more than any other type of food. Eating carbs naturally raises the amount of glucose in your blood. Carb counting is a method for keeping track of how many carbs you eat. Counting carbs is important to keep your blood glucose at a healthy level, especially if you use insulin or take certain oral diabetes medicines. It is important to know how many carbs you can safely have in each meal. This is different for every person. Your dietitian can help you calculate how many carbs you should have at each meal and for each snack. How does alcohol affect me? Alcohol can cause a sudden decrease in blood glucose (hypoglycemia), especially if you use insulin or take certain oral diabetes medicines. Hypoglycemia can be a life-threatening condition. Symptoms of hypoglycemia, such as sleepiness, dizziness, and confusion, are similar to symptoms of having too much  alcohol.  Do not drink alcohol if: ? Your health care provider tells you not to drink. ? You are pregnant, may be pregnant, or are planning to become pregnant.  If you drink alcohol: ? Do not drink on an empty stomach. ? Limit how much you use to:  0-1 drink a day for women.  0-2 drinks a day for men. ? Be aware of how much alcohol is in your drink. In the [place] one drink equals one 12 oz bottle of beer (355 mL), one 5 oz glass of wine (148 mL), or one 1 oz glass of hard liquor (44 mL). ? Keep yourself hydrated with water, diet soda, or unsweetened iced tea.  Keep in mind that regular soda, juice, and other mixers may contain a lot of sugar and must be counted as carbs. What are tips for following this plan? Reading food labels  Start by checking the serving size on the "Nutrition Facts" label of packaged foods and drinks. The amount of calories, carbs, fats, and other nutrients listed on the label is based on one serving of the item. Many items contain more than one serving per package.  Check the total grams (g) of carbs in one serving. You can calculate the number of servings of carbs in one serving by dividing the total carbs by 15. For example, if a food has 30 g of total carbs per serving, it would be equal to 2 servings of carbs.  Check the number of grams (g) of saturated fats and trans fats in one serving. Choose foods that have   a low amount or none of these fats.  Check the number of milligrams (mg) of salt (sodium) in one serving. Most people should limit total sodium intake to less than 2,300 mg per day.  Always check the nutrition information of foods labeled as "low-fat" or "nonfat." These foods may be higher in added sugar or refined carbs and should be avoided.  Talk to your dietitian to identify your daily goals for nutrients listed on the label. Shopping  Avoid buying canned, pre-made, or processed foods. These foods tend to be high in fat, sodium, and added  sugar.  Shop around the outside edge of the grocery store. This is where you will most often find fresh fruits and vegetables, bulk grains, fresh meats, and fresh dairy. Cooking  Use low-heat cooking methods, such as baking, instead of high-heat cooking methods like deep frying.  Cook using healthy oils, such as olive, canola, or sunflower oil.  Avoid cooking with butter, cream, or high-fat meats. Meal planning  Eat meals and snacks regularly, preferably at the same times every day. Avoid going long periods of time without eating.  Eat foods that are high in fiber, such as fresh fruits, vegetables, beans, and whole grains. Talk with your dietitian about how many servings of carbs you can eat at each meal.  Eat 4-6 oz (112-168 g) of lean protein each day, such as lean meat, chicken, fish, eggs, or tofu. One ounce (oz) of lean protein is equal to: ? 1 oz (28 g) of meat, chicken, or fish. ? 1 egg. ?  cup (62 g) of tofu.  Eat some foods each day that contain healthy fats, such as avocado, nuts, seeds, and fish.   What foods should I eat? Fruits Berries. Apples. Oranges. Peaches. Apricots. Plums. Grapes. Mango. Papaya. Pomegranate. Kiwi. Cherries. Vegetables Lettuce. Spinach. Leafy greens, including kale, chard, collard greens, and mustard greens. Beets. Cauliflower. Cabbage. Broccoli. Carrots. Green beans. Tomatoes. Peppers. Onions. Cucumbers. Brussels sprouts. Grains Whole grains, such as whole-wheat or whole-grain bread, crackers, tortillas, cereal, and pasta. Unsweetened oatmeal. Quinoa. Brown or wild rice. Meats and other proteins Seafood. Poultry without skin. Lean cuts of poultry and beef. Tofu. Nuts. Seeds. Dairy Low-fat or fat-free dairy products such as milk, yogurt, and cheese. The items listed above may not be a complete list of foods and beverages you can eat. Contact a dietitian for more information. What foods should I avoid? Fruits Fruits canned with  syrup. Vegetables Canned vegetables. Frozen vegetables with butter or cream sauce. Grains Refined white flour and flour products such as bread, pasta, snack foods, and cereals. Avoid all processed foods. Meats and other proteins Fatty cuts of meat. Poultry with skin. Breaded or fried meats. Processed meat. Avoid saturated fats. Dairy Full-fat yogurt, cheese, or milk. Beverages Sweetened drinks, such as soda or iced tea. The items listed above may not be a complete list of foods and beverages you should avoid. Contact a dietitian for more information. Questions to ask a health care provider  Do I need to meet with a diabetes educator?  Do I need to meet with a dietitian?  What number can I call if I have questions?  When are the best times to check my blood glucose? Where to find more information:  American Diabetes Association: diabetes.org  Academy of Nutrition and Dietetics: www.eatright.org  National Institute of Diabetes and Digestive and Kidney Diseases: www.niddk.nih.gov  Association of Diabetes Care and Education Specialists: www.diabeteseducator.org Summary  It is important to have healthy eating   habits because your blood sugar (glucose) levels are greatly affected by what you eat and drink.  A healthy meal plan will help you control your blood glucose and maintain a healthy lifestyle.  Your health care provider may recommend that you work with a dietitian to make a meal plan that is best for you.  Keep in mind that carbohydrates (carbs) and alcohol have immediate effects on your blood glucose levels. It is important to count carbs and to use alcohol carefully. This information is not intended to replace advice given to you by your health care provider. Make sure you discuss any questions you have with your health care provider. Document Revised: 01/07/2019 Document Reviewed: 01/07/2019 Elsevier Patient Education  2021 Elsevier Inc.  

## 2020-02-26 NOTE — Assessment & Plan Note (Signed)
Continue collaboration with pain management. 

## 2020-02-26 NOTE — Assessment & Plan Note (Signed)
Chronic, ongoing, currently seeing urology.  Continue current medication regimen and collaboration.

## 2020-02-26 NOTE — Assessment & Plan Note (Signed)
Chronic, ongoing with A1C 6.6% today.  Continue Benazepril for kidney protection and may consider reduction of Metformin in future dependent on kidney function on labs.  Educated him on kidney disease and monitoring.  Consider referral to nephrology in future if ever any worsening of function noted -- recent labs had improved.  Return in 3 months for T2DM check.

## 2020-02-26 NOTE — Assessment & Plan Note (Signed)
Chronic, ongoing with A1c 6.6% today.  Decreased sensation bilateral feet, monitor closely for wounds and falls.  Continue current medication regimen and adjust as needed.  Return in 3 months.

## 2020-02-26 NOTE — Assessment & Plan Note (Signed)
Chronic, ongoing.  Continue to monitor closely and refer to nephrology if decline -- recent labs had improved.  Continue Benazepril for kidney protection.  Renal dose medications as needed based on labs.

## 2020-02-27 LAB — CBC WITH DIFFERENTIAL/PLATELET
Basophils Absolute: 0 10*3/uL (ref 0.0–0.2)
Basos: 0 %
EOS (ABSOLUTE): 0.1 10*3/uL (ref 0.0–0.4)
Eos: 2 %
Hematocrit: 37.1 % — ABNORMAL LOW (ref 37.5–51.0)
Hemoglobin: 12.3 g/dL — ABNORMAL LOW (ref 13.0–17.7)
Immature Grans (Abs): 0 10*3/uL (ref 0.0–0.1)
Immature Granulocytes: 0 %
Lymphocytes Absolute: 1.7 10*3/uL (ref 0.7–3.1)
Lymphs: 28 %
MCH: 29.8 pg (ref 26.6–33.0)
MCHC: 33.2 g/dL (ref 31.5–35.7)
MCV: 90 fL (ref 79–97)
Monocytes Absolute: 0.6 10*3/uL (ref 0.1–0.9)
Monocytes: 10 %
Neutrophils Absolute: 3.6 10*3/uL (ref 1.4–7.0)
Neutrophils: 60 %
Platelets: 197 10*3/uL (ref 150–450)
RBC: 4.13 x10E6/uL — ABNORMAL LOW (ref 4.14–5.80)
RDW: 13.3 % (ref 11.6–15.4)
WBC: 6 10*3/uL (ref 3.4–10.8)

## 2020-02-27 LAB — LIPID PANEL W/O CHOL/HDL RATIO
Cholesterol, Total: 120 mg/dL (ref 100–199)
HDL: 43 mg/dL (ref >39)
LDL Chol Calc (NIH): 58 mg/dL (ref 0–99)
Triglycerides: 103 mg/dL (ref 0–149)
VLDL Cholesterol Cal: 19 mg/dL (ref 5–40)

## 2020-02-27 LAB — BASIC METABOLIC PANEL
BUN/Creatinine Ratio: 14 (ref 10–24)
BUN: 17 mg/dL (ref 8–27)
CO2: 25 mmol/L (ref 20–29)
Calcium: 9.6 mg/dL (ref 8.6–10.2)
Chloride: 99 mmol/L (ref 96–106)
Creatinine, Ser: 1.25 mg/dL (ref 0.76–1.27)
GFR calc Af Amer: 64 mL/min/{1.73_m2} (ref >59)
GFR calc non Af Amer: 55 mL/min/{1.73_m2} — ABNORMAL LOW (ref >59)
Glucose: 184 mg/dL — ABNORMAL HIGH (ref 65–99)
Potassium: 4.5 mmol/L (ref 3.5–5.2)
Sodium: 138 mmol/L (ref 134–144)

## 2020-02-27 LAB — VITAMIN D 25 HYDROXY (VIT D DEFICIENCY, FRACTURES): Vit D, 25-Hydroxy: 41.7 ng/mL (ref 30.0–100.0)

## 2020-02-27 LAB — HEPATITIS C ANTIBODY: Hep C Virus Ab: <0.1 s/co ratio (ref 0.0–0.9)

## 2020-02-27 LAB — TSH: TSH: 3.58 u[IU]/mL (ref 0.450–4.500)

## 2020-02-27 NOTE — Progress Notes (Signed)
Contacted via MyChart   Good morning Sean Reyes, your labs have returned: - Vitamin D level is in normal range - Hep C is negative - Kidney function continues to show some very mild kidney disease with GFR 55, but normal creatinine. - Cholesterol levels are at goal - Thyroid is normal - CBC continues to show some mild anemia with a lower hemoglobin and hematocrit, but this is trending upwards and improving.  Continue supplements, suspect this may have been related to your surgery.  Any questions? Keep being awesome!!  Thank you for allowing me to participate in your care. Kindest regards, Hiran Leard

## 2020-03-02 ENCOUNTER — Ambulatory Visit: Payer: Medicare Other

## 2020-03-02 ENCOUNTER — Other Ambulatory Visit: Payer: Self-pay

## 2020-03-02 DIAGNOSIS — M545 Low back pain, unspecified: Secondary | ICD-10-CM

## 2020-03-02 DIAGNOSIS — R2689 Other abnormalities of gait and mobility: Secondary | ICD-10-CM

## 2020-03-02 DIAGNOSIS — G8929 Other chronic pain: Secondary | ICD-10-CM

## 2020-03-02 DIAGNOSIS — M6281 Muscle weakness (generalized): Secondary | ICD-10-CM

## 2020-03-02 NOTE — Therapy (Signed)
[facility] [address] [place], [place], [address] Phone: [phone]   Fax:  [phone]  Physical Therapy Treatment  Patient Details  Name: Sean Reyes. MRN: [id number] Date of Birth: [birth year] Referring Provider (PT): Gillis Santa, MD   Encounter Date: 03/02/2020   PT End of Session - 03/02/20 1356    Visit Number 13    Number of Visits 25    Date for PT Re-Evaluation 03/25/20    Authorization Type eval: 01/01/20    PT Start Time 1259    PT Stop Time 1343    PT Time Calculation (min) 44 min    Activity Tolerance Patient tolerated treatment well    Behavior During Therapy [facility] for tasks assessed/performed           Past Medical History:  Diagnosis Date  . Anemia   . Anxiety   . Arthritis   . Atrial fibrillation ([place])   . Cataracts, bilateral   . Complication of anesthesia    pt reports low BP's after surgery at [facility] and difficulty awakening  . Depression   . Diabetes ([place])    dx 6-8 yrs ago  . Dysrhythmia    a-fib  . GERD (gastroesophageal reflux disease)    OCC TAKES ALKA SELTZER  . History of kidney stones    10-15 yrs ago  . 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 Hyperlipidemia   . Hypertension   . Nocturia   . S/P ablation of atrial fibrillation    Ablative therapy  . Sleep apnea    CPAP   . Tachycardia, unspecified     Past Surgical History:  Procedure Laterality Date  . ABLATION    . ANTERIOR LAT LUMBAR FUSION N/A 06/27/2017   Procedure: Anterior Lateral Lumbar Interbody  Fusion - Lumbar Two-Lumbar Three - Lumbar Three-Lumbar Four, Posterior Lumbar Interbody Fusion Lumbar Four- Five;  Surgeon: Kary Kos, MD;  Location: [facility];  Service: Neurosurgery;  Laterality: N/A;  Anterior Lateral Lumbar Interbody  Fusion - Lumbar Two-Lumbar Three - Lumbar Three-Lumbar Four, Posterior Lumbar Interbody Fusion Lumbar Four- Five  . BACK SURGERY    . CARDIOVERSION N/A 08/29/2018    Procedure: CARDIOVERSION;  Surgeon: Corey Skains, MD;  Location: [facility];  Service: Cardiovascular;  Laterality: N/A;  . CARDIOVERSION N/A 09/24/2018   Procedure: CARDIOVERSION;  Surgeon: Corey Skains, MD;  Location: [facility];  Service: Cardiovascular;  Laterality: N/A;  . COLONOSCOPY WITH PROPOFOL N/A 10/05/2015   Procedure: COLONOSCOPY WITH PROPOFOL;  Surgeon: Lollie Sails, MD;  Location[facility] ENDOSCOPY;  Service: Endoscopy;  Laterality: N/A;  . ESOPHAGOGASTRODUODENOSCOPY (EGD) WITH PROPOFOL N/A 04/01/2018   Procedure: ESOPHAGOGASTRODUODENOSCOPY (EGD) WITH PROPOFOL;  Surgeon: Lollie Sails, MD;  Location: [facility] ENDOSCOPY;  Service: Endoscopy;  Laterality: N/A;  . HERNIA REPAIR    . JOINT REPLACEMENT Bilateral    hips  RT+  LEFT X2   . LUMBAR LAMINECTOMY/DECOMPRESSION MICRODISCECTOMY Left 09/13/2016   Procedure: Microdiscectomy - Lumbar two-three,  Lumbar three- - left;  Surgeon: Kary Kos, MD;  Location: [facility];  Service: Neurosurgery;  Laterality: Left;  . SPINAL CORD STIMULATOR INSERTION  07/08/2019  . TONSILLECTOMY      There were no vitals filed for this visit.   Subjective Assessment - 03/02/20 1355    Subjective Pt reports that he is doing well today. Denies any pain today at rest but has had some pain with activity and fatigue.  No specific questions upon arrival.    Pertinent History Pt is a 80 y.o. male referred to PT for LBP. Pt has extensive PMH of failed back surgical syndrome, postlaminectomy syndrome, chronic radicular pain, hx of lumbar fusion spine, spinal cord stimulator, paroxysmal A-fib, DM-Type 2, HTN, chronic kidney disease. Pt reports 4 fusions on lumbar spine due to protruding disks which was ~2 years ago. Pt reports that his spine stimulator has started to help some but his biggest issue is his decreased strength in his spine, legs, and core. In sitting pt has 0/10 NPS. With household distance pt has 4-5/10 NPS but is instantly relieved with sitting. With  long walks, from the clinic to the car (100 yds)  8-9/10 NPS. The pain is described as dull pain. Pt denies radicular symptoms. [name] LBP is located acros his belt line right above his buttocks. Pt reports difficulty with his balance and asc/desc stairs but at home he uses no AD at home. Pt does report a fall 1.5 months ago with his cane and fell forward onto his head where he reports he could not catch himself going forward where he hit his head. This is his only fall with no injury. [name] big goal with PT is to be able to indep replace your deck.    How long can you sit comfortably? unlimited    How long can you stand comfortably? 4-5 min    How long can you walk comfortably? 4-5 min    Patient Stated Goals Indep rebuild deck.    Currently in Pain? No/denies            TREATMENT   Ther-ex NuStep L2 x 5 minutes for warm-up during history, seat position 13, arms 10, 4 minutes unbilled; Hooklying marches2 x 20 BLE; Hooklyingadductorsqueezewith manual resistance from therapistx 20BLE; Hooklying clams with manual resistance from therapist x 20 BLE; Hooklying bridgesx 20; SLR x 20 BLE; Supine straight leg hip abductionand adduction with manual resistance from therapist x 15BLE; Supine heel sides with manually resisted extension x 20 BLE; Seated marches x 20 BLE; Standing alternating 6" step taps without UE support x 20 each; Step-ups without UE support alternating leading LE x 10 each; Sit to stand from elevated mat table without UE assist x 10, added 5# dumbbell x 10;   Manual Therapy Single knee-to-chest stretch 30shold BLE; Hip IR/ER stretch 30 sec. hold each BLE; Hamstring stretch 30 sec. hold each BLE; Rest breaks provided between stretches;   Pt educated throughout session about proper posture and technique with exercises. Improved exercise technique, movement at target joints, use of target muscles after min to mod verbal, visual, tactile cues.    Patient  demonstrates good motivation during session today.No pain reported during session however he does have intermittent fatigue. His stress is improving and he demonstrates improved ease performing sit to stands today. Will progress seated and standing strengthening exercises for functional carryover in future sessions as patient is able to tolerate. Patient encouraged to continue HEP and follow-up as scheduled. He has yet to achieve maximal benefit from PT services. Patient will continue benefit from skilled PT services to addressdeficits in strength and pain in order to return to full function at home.                                PT Long Term Goals - 01/29/20 1304      PT LONG TERM GOAL #1  Title Pt will improve FOTO score to target goal (49) to demonstrate clinically significant improvement in functional mobility.    Baseline 01/29/20: 45; 01/06/20: 38;    Time 8    Period Weeks    Status Partially Met    Target Date 03/25/20      PT LONG TERM GOAL #2   Title Pt will improve 5xSTS score by at least 5 sec with no UE support to demonstrate clinically significant improvement in BLE strength.    Baseline 01/29/20: 15.9s 11/18: 27 sec with BUE suppport    Time 8    Period Weeks    Status Achieved    Target Date 03/25/20      PT LONG TERM GOAL #3   Title Pt will improve 2 MWT with no AD by 40' to demonstrate clinically significant improvement in endurance to improve household and community ambulation.    Baseline 01/29/20: Deferred due to elevated resting BP; 11/18: 175' no AD.    Time 8    Period Weeks    Status Deferred    Target Date 03/25/20      PT LONG TERM GOAL #4   Title Pt will ambulate with LRAD with normalized gait pattern > 5 min to reduce tripping hazard with shuffling gait.    Baseline 11/18: Pt amb with single LSC and shuffling gait after 2 min due to fatigue,    Time 8    Period Weeks    Status Deferred    Target Date 03/25/20                  Plan - 03/02/20 1356    Clinical Impression Statement Patient demonstrates good motivation during session today.  No pain reported during session however he does have intermittent fatigue. His stress is improving and he demonstrates improved ease performing sit to stands today. Will progress seated and standing strengthening exercises for functional carryover in future sessions as patient is able to tolerate. Patient encouraged to continue HEP and follow-up as scheduled.  He has yet to achieve maximal benefit from PT services.  Patient will continue benefit from skilled PT services to address deficits in strength and pain in order to return to full function at home.    Personal Factors and Comorbidities Age;Comorbidity 3+;Education;Past/Current Experience;Time since onset of injury/illness/exacerbation    Comorbidities spinal cord stimulator, paroxysmal A-fib, DM-Type 2, HTN, chronic kidney disease.    Examination-Activity Limitations Bed Mobility;Stand;Locomotion Level;Toileting;Bend;Reach Overhead;Carry;Squat;Hygiene/Grooming;Stairs    Examination-Participation Restrictions Occupation;Community Activity;Yard Work    Stability/Clinical Decision Making Unstable/Unpredictable    Rehab Potential Fair    PT Frequency 2x / week    PT Duration 8 weeks    PT Treatment/Interventions ADLs/Self Care Home Management;Moist Heat;Gait training;Stair training;Therapeutic activities;Therapeutic exercise;Balance training;Neuromuscular re-education;Patient/family education;Manual techniques;Functional mobility training;Cryotherapy    PT Next Visit Plan Reassess HEP, Improve LE strength and endurance.    PT Home Exercise Plan spinal cord stimulator, paroxysmal A-fib, DM-Type 2, HTN, chronic kidney disease.    Consulted and Agree with Plan of Care Patient           Patient will benefit from skilled therapeutic intervention in order to improve the following deficits and impairments:  Abnormal  gait,Pain,Improper body mechanics,Decreased mobility,Postural dysfunction,Decreased activity tolerance,Decreased endurance,Decreased range of motion,Decreased strength,Decreased balance,Difficulty walking,Impaired flexibility  Visit Diagnosis: Chronic bilateral low back pain without sciatica  Muscle weakness (generalized)  Other abnormalities of gait and mobility     Problem List Patient Active Problem List   Diagnosis Date  Noted  . Spinal cord stimulator status 12/11/2019  . History of 2019 novel coronavirus disease (COVID-19) 10/02/2019  . CKD (chronic kidney disease) stage 3, GFR 30-59 ml/min (HCC) 01/19/2019  . Acquired thrombophilia ([place]) 01/19/2019  . 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 Postlaminectomy syndrome, lumbar region 01/16/2019  . History of fusion of lumbar spine (L2-L5) 01/16/2019  . Chronic radicular lumbar pain 01/16/2019  . HNP (herniated nucleus pulposus), lumbar 04/29/2018  . Advanced care planning/counseling discussion 09/28/2016  . Spinal stenosis, lumbar region, 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/13/2016  . Hyperlipidemia associated with type 2 diabetes mellitus ([place]) 07/14/2015  . Anemia 06/30/2015  . 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 OSA (obstructive sleep apnea) 03/23/2015  . Type 2 diabetes mellitus with diabetic chronic kidney disease ([place]) 01/05/2015  . Hypertension associated with diabetes ([place]) 09/28/2014  . 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[place]) 09/28/2014  . H/O prior ablation treatment 10/19/2011  . Paroxysmal atrial fibrillation (HCC) 10/19/2011   Phillips Grout PT, DPT, GCS  Aadvik Roker 03/02/2020, 2:01 PM  [facility] [address] [place], [place], [address] Phone: [phone]   Fax:  [phone]  Name: Sean Reyes. MRN: [id number] Date of Birth: 07-04-1940

## 2020-03-04 ENCOUNTER — Ambulatory Visit: Payer: Medicare Other

## 2020-03-04 ENCOUNTER — Other Ambulatory Visit: Payer: Self-pay

## 2020-03-04 DIAGNOSIS — M545 Low back pain, unspecified: Secondary | ICD-10-CM | POA: Diagnosis not present

## 2020-03-04 DIAGNOSIS — R2689 Other abnormalities of gait and mobility: Secondary | ICD-10-CM

## 2020-03-04 DIAGNOSIS — G8929 Other chronic pain: Secondary | ICD-10-CM

## 2020-03-04 DIAGNOSIS — M6281 Muscle weakness (generalized): Secondary | ICD-10-CM

## 2020-03-04 NOTE — Therapy (Signed)
[facility] [address]. [place], [place], [address] Phone: [phone]   Fax:  [phone]  Physical Therapy Treatment  Patient Details  Name: Sean Reyes. MRN: [id number] Date of Birth: Feb 19, 1940 Referring Provider (PT): Gillis Santa, MD   Encounter Date: 03/04/2020   PT End of Session - 03/04/20 1302    Visit Number 14    Number of Visits 25    Date for PT Re-Evaluation 03/25/20    Authorization Type eval: 01/01/20    PT Start Time 1300    PT Stop Time 1345    PT Time Calculation (min) 45 min    Activity Tolerance Patient tolerated treatment well    Behavior During Therapy [facility] for tasks assessed/performed           Past Medical History:  Diagnosis Date  . Anemia   . Anxiety   . Arthritis   . Atrial fibrillation ([place])   . Cataracts, bilateral   . Complication of anesthesia    pt reports low BP's after surgery at [facility] and difficulty awakening  . Depression   . Diabetes ([place])    dx 6-8 yrs ago  . Dysrhythmia    a-fib  . GERD (gastroesophageal reflux disease)    OCC TAKES ALKA SELTZER  . History of kidney stones    10-15 yrs ago  . 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 Hyperlipidemia   . Hypertension   . Nocturia   . S/P ablation of atrial fibrillation    Ablative therapy  . Sleep apnea    CPAP   . Tachycardia, unspecified     Past Surgical History:  Procedure Laterality Date  . ABLATION    . ANTERIOR LAT LUMBAR FUSION N/A 06/27/2017   Procedure: Anterior Lateral Lumbar Interbody  Fusion - Lumbar Two-Lumbar Three - Lumbar Three-Lumbar Four, Posterior Lumbar Interbody Fusion Lumbar Four- Five;  Surgeon: Kary Kos, MD;  Location: [facility];  Service: Neurosurgery;  Laterality: N/A;  Anterior Lateral Lumbar Interbody  Fusion - Lumbar Two-Lumbar Three - Lumbar Three-Lumbar Four, Posterior Lumbar Interbody Fusion Lumbar Four- Five  . BACK SURGERY    . CARDIOVERSION N/A 08/29/2018    Procedure: CARDIOVERSION;  Surgeon: Corey Skains, MD;  Location: [facility];  Service: Cardiovascular;  Laterality: N/A;  . CARDIOVERSION N/A 09/24/2018   Procedure: CARDIOVERSION;  Surgeon: Corey Skains, MD;  Location: [facility];  Service: Cardiovascular;  Laterality: N/A;  . COLONOSCOPY WITH PROPOFOL N/A 10/05/2015   Procedure: COLONOSCOPY WITH PROPOFOL;  Surgeon: Lollie Sails, MD;  Location: [facility] END[facility]COPY;  Service: Endoscopy;  Laterality: N/A;  . ESOPHAGOGASTRODUODENOSCOPY (EGD) WITH PROPOFOL N/A 04/01/2018   Procedure: ESOPHAGOGASTRODUODENOSCOPY (EGD) WITH PROPOFOL;  Surgeon: Lollie Sails, MD;  Location: [facility] ENDOSCOPY;  Service: Endoscopy;  Laterality: N/A;  . HERNIA REPAIR    . JOINT REPLACEMENT Bilateral    hips  RT+  LEFT X2   . LUMBAR LAMINECTOMY/DECOMPRESSION MICRODISCECTOMY Left 09/13/2016   Procedure: Microdiscectomy - Lumbar two-three,  Lumbar three- - left;  Surgeon: Kary Kos, MD;  Location: [facility];  Service: Neurosurgery;  Laterality: Left;  . SPINAL CORD STIMULATOR INSERTION  07/08/2019  . TONSILLECTOMY      There were no vitals filed for this visit.   Subjective Assessment - 03/04/20 1302    Subjective Pt reports that he is doing alright today. He states that last night and this morning he has been having L anterior thigh  soreness again. Rates it as a 5/10 upon arrival today. Low back fatigue but no pain. No specific questions upon arrival.    Pertinent History Pt is a 80 y.o. male referred to PT for LBP. Pt has extensive PMH of failed back surgical syndrome, postlaminectomy syndrome, chronic radicular pain, hx of lumbar fusion spine, spinal cord stimulator, paroxysmal A-fib, DM-Type 2, HTN, chronic kidney disease. Pt reports 4 fusions on lumbar spine due to protruding disks which was ~2 years ago. Pt reports that his spine stimulator has started to help some but his biggest issue is his decreased strength in his spine, legs, and core. In sitting pt has 0/10 NPS.  With household distance pt has 4-5/10 NPS but is instantly relieved with sitting. With long walks, from the clinic to the car (100 yds)  8-9/10 NPS. The pain is described as dull pain. Pt denies radicular symptoms. [name] LBP is located acros his belt line right above his buttocks. Pt reports difficulty with his balance and asc/desc stairs but at home he uses no AD at home. Pt does report a fall 1.5 months ago with his cane and fell forward onto his head where he reports he could not catch himself going forward where he hit his head. This is his only fall with no injury. [name] big goal with PT is to be able to indep replace your deck.    How long can you sit comfortably? unlimited    How long can you stand comfortably? 4-5 min    How long can you walk comfortably? 4-5 min    Patient Stated Goals Indep rebuild deck.    Currently in Pain? Yes    Pain Score 5     Pain Location Leg    Pain Orientation Anterior;Left    Pain Descriptors / Indicators Sore    Pain Type Chronic pain    Pain Onset More than a month ago    Pain Frequency Intermittent             TREATMENT   Ther-ex Hooklying marches x 20 BLE; Hooklying lumbar rotation knee rocking x 1 minute; Hooklying lumbar rotation stretch 2 x 30s on each side; Hooklyingadductorsqueezewith manual resistance from therapistx 20BLE; Hooklying clams with manual resistance from therapist x 20 BLE; Hooklying bridgesx 20; SLR x 10 LLE, dc secondary to L hip flexor soreness, x 20 RLE; Sidelying hip abduction x 15 BLE; Sidelying clams with manual resistance from therapist x 15 BLE; Sidelying reverse clams x 15 BLE; Supine heel sides with manually resisted extension x 20 BLE; Supine straight knee pball bridges x 10; Supine straight knee pball lateral rocking x 10; Total Gym L22 squats 2 x 10; Total Gym L22 heel raises 2 x 20; NuStep L2-3 x 5 minutes for warm-up during history, seat position 14, arms 11, unbilled;   Manual  Therapy Single knee-to-chest stretch 30shold BLE; Hip IR/ER stretch 30 sec. hold each BLE; Hamstring stretch 30 sec. hold each BLE; Sidelying hip flexor stretch x 30s bilateral; Supine L hip flexor stretch in Thomas Test position x 30s; Rest breaks provided between stretches;   Pt educated throughout session about proper posture and technique with exercises. Improved exercise technique, movement at target joints, use of target muscles after min to mod verbal, visual, tactile cues.    Patient demonstrates good motivation during session today.He reports some intermittent L anterior thigh soreness during session. Incorporated hip flexor stretching today as well as additional strengthening exercises tody such as Total Gym squats  and heel raises. Will progress seated and standing strengthening exercises for functional carryover in future sessions as patient is able to tolerate. Patient encouraged to continue HEP and follow-up as scheduled. He has yet to achieve maximal benefit from PT services. Patient will continue benefit from skilled PT services to addressdeficits in strength and pain in order to return to full function at home.                                    PT Long Term Goals - 01/29/20 1304      PT LONG TERM GOAL #1   Title Pt will improve FOTO score to target goal (49) to demonstrate clinically significant improvement in functional mobility.    Baseline 01/29/20: 45; 01/06/20: 38;    Time 8    Period Weeks    Status Partially Met    Target Date 03/25/20      PT LONG TERM GOAL #2   Title Pt will improve 5xSTS score by at least 5 sec with no UE support to demonstrate clinically significant improvement in BLE strength.    Baseline 01/29/20: 15.9s 11/18: 27 sec with BUE suppport    Time 8    Period Weeks    Status Achieved    Target Date 03/25/20      PT LONG TERM GOAL #3   Title Pt will improve 2 MWT with no AD by 40' to demonstrate  clinically significant improvement in endurance to improve household and community ambulation.    Baseline 01/29/20: Deferred due to elevated resting BP; 11/18: 175' no AD.    Time 8    Period Weeks    Status Deferred    Target Date 03/25/20      PT LONG TERM GOAL #4   Title Pt will ambulate with LRAD with normalized gait pattern > 5 min to reduce tripping hazard with shuffling gait.    Baseline 11/18: Pt amb with single LSC and shuffling gait after 2 min due to fatigue,    Time 8    Period Weeks    Status Deferred    Target Date 03/25/20                 Plan - 03/04/20 1303    Clinical Impression Statement Patient demonstrates good motivation during session today. He reports some intermittent L anterior thigh soreness during session. Incorporated hip flexor stretching today as well as additional strengthening exercises tody such as Total Gym squats and heel raises. Will progress seated and standing strengthening exercises for functional carryover in future sessions as patient is able to tolerate. Patient encouraged to continue HEP and follow-up as scheduled.  He has yet to achieve maximal benefit from PT services.  Patient will continue benefit from skilled PT services to address deficits in strength and pain in order to return to full function at home.    Personal Factors and Comorbidities Age;Comorbidity 3+;Education;Past/Current Experience;Time since onset of injury/illness/exacerbation    Comorbidities spinal cord stimulator, paroxysmal A-fib, DM-Type 2, HTN, chronic kidney disease.    Examination-Activity Limitations Bed Mobility;Stand;Locomotion Level;Toileting;Bend;Reach Overhead;Carry;Squat;Hygiene/Grooming;Stairs    Examination-Participation Restrictions Occupation;Community Activity;Yard Work    Stability/Clinical Decision Making Unstable/Unpredictable    Rehab Potential Fair    PT Frequency 2x / week    PT Duration 8 weeks    PT Treatment/Interventions ADLs/Self Care  Home Management;Moist Heat;Gait training;Stair training;Therapeutic activities;Therapeutic exercise;Balance training;Neuromuscular re-education;Patient/family education;Manual techniques;Functional mobility  training;Cryotherapy    PT Next Visit Plan Reassess HEP, Improve LE strength and endurance.    PT Home Exercise Plan spinal cord stimulator, paroxysmal A-fib, DM-Type 2, HTN, chronic kidney disease.    Consulted and Agree with Plan of Care Patient           Patient will benefit from skilled therapeutic intervention in order to improve the following deficits and impairments:  Abnormal gait,Pain,Improper body mechanics,Decreased mobility,Postural dysfunction,Decreased activity tolerance,Decreased endurance,Decreased range of motion,Decreased strength,Decreased balance,Difficulty walking,Impaired flexibility  Visit Diagnosis: Chronic bilateral low back pain without sciatica  Muscle weakness (generalized)  Other abnormalities of gait and mobility     Problem List Patient Active Problem List   Diagnosis Date Noted  . Spinal cord stimulator status 12/11/2019  . History of 2019 novel coronavirus disease (COVID-19) 10/02/2019  . CKD (chronic kidney disease) stage 3, GFR 30-59 ml/min (HCC) 01/19/2019  . Acquired thrombophilia ([place]) 01/19/2019  . 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 Postlaminectomy syndrome, lumbar region 01/16/2019  . History of fusion of lumbar spine (L2-L5) 01/16/2019  . Chronic radicular lumbar pain 01/16/2019  . HNP (herniated nucleus pulposus), lumbar 04/29/2018  . Advanced care planning/counseling discussion 09/28/2016  . Spinal stenosis, lumbar region, 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/13/2016  . Hyperlipidemia associated with type 2 diabetes mellitus ([place]) 07/14/2015  . Anemia 06/30/2015  . 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 OSA (obstructive sleep apnea) 03/23/2015  . Type 2 diabetes mellitus with diabetic chronic kidney  disease ([place]) 01/05/2015  . Hypertension associated with diabetes ([place]) 09/28/2014  . 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[place]) 09/28/2014  . H/O prior ablation treatment 10/19/2011  . Paroxysmal atrial fibrillation ([place]) 10/19/2011   Phillips Grout PT, DPT, GCS  Nicodemus Denk 03/04/2020, 3:10 PM  [facility] [address]. [place], [place], [address] Phone: [phone]   Fax:  [phone]  Name: Sean Reyes. MRN: [id number] Date of Birth: March 13, 1940

## 2020-03-09 ENCOUNTER — Ambulatory Visit: Payer: Medicare Other

## 2020-03-09 ENCOUNTER — Other Ambulatory Visit: Payer: Self-pay | Admitting: Nurse Practitioner

## 2020-03-12 ENCOUNTER — Other Ambulatory Visit: Payer: Self-pay | Admitting: Nurse Practitioner

## 2020-03-16 ENCOUNTER — Other Ambulatory Visit: Payer: Self-pay

## 2020-03-16 ENCOUNTER — Ambulatory Visit (INDEPENDENT_AMBULATORY_CARE_PROVIDER_SITE_OTHER): Payer: Medicare Other | Admitting: General Practice

## 2020-03-16 ENCOUNTER — Telehealth: Payer: Medicare Other | Admitting: General Practice

## 2020-03-16 ENCOUNTER — Ambulatory Visit: Payer: Medicare Other | Attending: Student in an Organized Health Care Education/Training Program

## 2020-03-16 DIAGNOSIS — R2689 Other abnormalities of gait and mobility: Secondary | ICD-10-CM | POA: Diagnosis not present

## 2020-03-16 DIAGNOSIS — M6281 Muscle weakness (generalized): Secondary | ICD-10-CM | POA: Insufficient documentation

## 2020-03-16 DIAGNOSIS — R339 Retention of urine, unspecified: Secondary | ICD-10-CM

## 2020-03-16 DIAGNOSIS — M545 Low back pain, unspecified: Secondary | ICD-10-CM | POA: Insufficient documentation

## 2020-03-16 DIAGNOSIS — E1159 Type 2 diabetes mellitus with other circulatory complications: Secondary | ICD-10-CM

## 2020-03-16 DIAGNOSIS — R3915 Urgency of urination: Secondary | ICD-10-CM

## 2020-03-16 DIAGNOSIS — M5416 Radiculopathy, lumbar region: Secondary | ICD-10-CM

## 2020-03-16 DIAGNOSIS — Z981 Arthrodesis status: Secondary | ICD-10-CM

## 2020-03-16 DIAGNOSIS — G8929 Other chronic pain: Secondary | ICD-10-CM | POA: Insufficient documentation

## 2020-03-16 DIAGNOSIS — Z9689 Presence of other specified functional implants: Secondary | ICD-10-CM

## 2020-03-16 DIAGNOSIS — I152 Hypertension secondary to endocrine disorders: Secondary | ICD-10-CM | POA: Diagnosis not present

## 2020-03-16 NOTE — Patient Instructions (Signed)
Visit Information  PATIENT GOALS: Goals Addressed              This Visit's Progress   .  COMPLETED: RNCM: Pt-"I do the best I can" (pt-stated)        CARE PLAN ENTRY (see longtitudinal plan of care for additional care plan information)  Current Barriers: Closing this care plan and opening in new ELS . Chronic Disease Management support, education, and care coordination needs related to Atrial Fibrillation, HTN, HLD, DMII, and Chronic Back Pain . Limited mobility related to chronic back pain and discomfort  Clinical Goal(s) related to Atrial Fibrillation, HTN, HLD, DMII, and Chronic  Back Pain :  Over the next 120 days, patient will:  . Work with the care management team to address educational, disease management, and care coordination needs  . Begin or continue self health monitoring activities as directed today Measure and record cbg (blood glucose) 1 times daily, Measure and record blood pressure 3 times per week, and follow a Heart Healthy/ADA diet . Call provider office for new or worsened signs and symptoms Blood glucose findings outside established parameters, Blood pressure findings outside established parameters, Shortness of breath, and New or worsened symptom related to AFIB and Chronic back pain . Call care management team with questions or concerns . Verbalize basic understanding of patient centered plan of care established today  Interventions related to Atrial Fibrillation, HTN, HLD, DMII, and Chronic Back Pain :  . Evaluation of current treatment plans and patient's adherence to plan as established by provider.  The patient is going on May 25th to have a "spine pain stimulator" placed.  He will go back on June the 1st and if this is working for him it will be permanently implanted. The patient is hopeful this will give him much needed relief related to his chronic back pain. His pain level is tolerable when he is sitting down but when he is walking it is almost  "unbearable".  Education and support.  11-19-2019: The patient states that he is still having pain but is only on level 5 of the stimulator and the stimulator has 35 programs. He is frustrated because he does not seem to be making any progress in helping with his chronic back pain. The patient states he just had an adjustment on Monday and he thinks that has made it worse.  He has not had any falls but he is walking without a cane or a crutch at this time.  He says he can't mow the yard, blow leaves, do yard work, house work, or any of his other normal activities. He is upset because of the burden he has placed on his daughter.  He is still hopeful that he will be able to do more independently and not have pain.  Encouraged the patient to call the pain stimulator specialist for advice and recommendations on what setting to place the stimulator for some relief to his pain and discomfort. 01-21-2020: The patient is happy to report that they have found the "sweat spot" for the stimulator and he is not experiencing back pain. He is so happy they have found this level and it is doing its job to help with his pain and discomfort. The patient states that he is weak, but is working with physical therapy 2 times a week. He is so thankful for the positive changes.  He continue to do the exercises at home.  He feels so much better. . Assessed patient understanding of disease  states- the patient is very knowledgeable about his health conditions but chronic back pain limits him in managing his care effectively. Encouraged the patient to monitor his blood pressure and blood sugars more frequently.  The patient had a recent hemoglobin A1C of 6.3.  He will continue to take his readings and record findings. He will bring at his next scheduled appointment. Endorses compliance with regimen to manage his chronic conditions. 01-21-2020: the patient states that he is doing so much better. All his other chronic conditions are stable at  this time.  . Assessed patient's education and care coordination needs- education and support on monitoring his dietary intake- watching his sodium and sugar. Will continue to encourage healthy food choices.  Patient verbalized he has gained weight and his weight is 197 now. 01-21-2020: Denies any needs at this time. Did review with the patient watching his diet during the holidays and monitoring for sodium, fats, and sugars.  The patient denies any issues with compliance.  . Assessed safety when ambulating. The patient is using a "crutch" when he walks. The patient verbalized he would not be able to walk if he did not use his crutch. Encouraged the patient to pace his activities and know his limitations. The patient does not like to take pain medications and "deals with the pain".  Encouraged the patient to listen to his body and take breaks as needed to help with pain management. 01-21-2020: The patient is doing so well that he is not having to use his cane to walk.  He is mindful of his surroundings and thankful for the progress he has made. Reminded of safety.  . Provided disease specific education to patient- Information on DASH and ADA diets provided by the MyChart function- completed.  Will search for information related to spine pain stimulator to send by MyChart for the patient.  . Review of Vitamin B12 supplement as recommended by pcp. The patient has started taking the supplement of Vitamin B12 as recommended by the pcp. 01-21-2020: The patient continues to follow recommendations by the pcp.  Nash Dimmer with appropriate clinical care team members regarding patient needs. Knows of the CCM team for support for support. Denies any needs for pharmacist or LCSW at this time.  . Evaluation of next appointment with pcp: The patient has a follow up appointment with the pcp on 02-26-2019.  The RNCM will continue to follow.  The patient will call for needs or changes before next outreach.   Patient Self  Care Activities related to Atrial Fibrillation, HTN, HLD, DMII, and Chronic Back Pain :  . Patient is unable to independently self-manage chronic health conditions  Please see past updates related to this goal by clicking on the "Past Updates" button in the selected goal         Patient verbalizes understanding of instructions provided today and agrees to view in [place].   Telephone follow up appointment with care management team member scheduled for: 06-01-2020 at 0900 am  Noreene Larsson RN, MSN, [facility] Mobile: [phone]  Urinary Tract Infection, Adult A urinary tract infection (UTI) is an infection of any part of the urinary tract. The urinary tract includes: The kidneys. The ureters. The bladder. The urethra. These organs make, store, and get rid of pee (urine) in the body. What are the causes? This infection is caused by germs (bacteria) in your genital area. These germs grow and cause swelling (inflammation) of  your urinary tract. What increases the risk? The following factors may make you more likely to develop this condition: Using a small, thin tube (catheter) to drain pee. Not being able to control when you pee or poop (incontinence). Being male. If you are male, these things can increase the risk: Using these methods to prevent pregnancy: A medicine that kills sperm (spermicide). A device that blocks sperm (diaphragm). Having low levels of a male hormone (estrogen). Being pregnant. You are more likely to develop this condition if: You have genes that add to your risk. You are sexually active. You take antibiotic medicines. You have trouble peeing because of: A prostate that is bigger than normal, if you are male. A blockage in the part of your body that drains pee from the bladder. A kidney stone. A nerve condition that affects your bladder. Not getting enough to  drink. Not peeing often enough. You have other conditions, such as: Diabetes. A weak disease-fighting system (immune system). Sickle cell disease. Gout. Injury of the spine. What are the signs or symptoms? Symptoms of this condition include: Needing to pee right away. Peeing small amounts often. Pain or burning when peeing. Blood in the pee. Pee that smells bad or not like normal. Trouble peeing. Pee that is cloudy. Fluid coming from the vagina, if you are male. Pain in the belly or lower back. Other symptoms include: Vomiting. Not feeling hungry. Feeling mixed up (confused). This may be the first symptom in older adults. Being tired and grouchy (irritable). A fever. Watery poop (diarrhea). How is this treated? Taking antibiotic medicine. Taking other medicines. Drinking enough water. In some cases, you may need to see a specialist. Follow these instructions at home: Medicines Take over-the-counter and prescription medicines only as told by your doctor. If you were prescribed an antibiotic medicine, take it as told by your doctor. Do not stop taking it even if you start to feel better. General instructions Make sure you: Pee until your bladder is empty. Do not hold pee for a long time. Empty your bladder after sex. Wipe from front to back after peeing or pooping if you are a male. Use each tissue one time when you wipe. Drink enough fluid to keep your pee pale yellow. Keep all follow-up visits.   Contact a doctor if: You do not get better after 1-2 days. Your symptoms go away and then come back. Get help right away if: You have very bad back pain. You have very bad pain in your lower belly. You have a fever. You have chills. You feeling like you will vomit or you vomit. Summary A urinary tract infection (UTI) is an infection of any part of the urinary tract. This condition is caused by germs in your genital area. There are many risk factors for a  UTI. Treatment includes antibiotic medicines. Drink enough fluid to keep your pee pale yellow. This information is not intended to replace advice given to you by your health care provider. Make sure you discuss any questions you have with your health care provider. Document Revised: 09/12/2019 Document Reviewed: 09/12/2019 Elsevier Patient Education  [place].

## 2020-03-16 NOTE — Chronic Care Management (AMB) (Signed)
Chronic Care Management   CCM RN Visit Note  03/16/2020 Name: Sean Reyes. MRN: [id number] DOB: 18-Dec-1940  Subjective: Sean Reyes. is a 80 y.o. year old male who is a primary care patient of [name], [name], NP. The care management team was consulted for assistance with disease management and care coordination needs.    Engaged with patient by telephone for follow up visit in response to provider referral for case management and/or care coordination services.   Consent to Services:  The patient was given information about Chronic Care Management services, agreed to services, and gave verbal consent prior to initiation of services.  Please see initial visit note for detailed documentation.   Patient agreed to services and verbal consent obtained.   Assessment: Review of patient past medical history, allergies, medications, health status, including review of consultants reports, laboratory and other test data, was performed as part of comprehensive evaluation and provision of chronic care management services.   SDOH (Social Determinants of Health) assessments and interventions performed:    CCM Care Plan  Allergies  Allergen Reactions   Levaquin [Levofloxacin In D5w] Anaphylaxis and Shortness Of Breath   Shellfish Allergy Anaphylaxis    Has used duraprep, betadine and ioban in previous surgeries in 2019 and 2018 without issue   Amiodarone Other (See Comments)    Tremors and thyroid toxicity   Adhesive [Tape] Other (See Comments)    Little red bumps under the dressing.  He questions whether is latex related    Outpatient Encounter Medications as of 03/16/2020  Medication Sig   acetaminophen (TYLENOL) 500 MG tablet Take 500 mg by mouth every 6 (six) hours as needed (pain).   albuterol (VENTOLIN HFA) 108 (90 Base) MCG/ACT inhaler Inhale 2 puffs into the lungs every 6 (six) hours as needed for wheezing or shortness of breath.   AMLODIPINE BESYLATE PO Take 5 mg by mouth.    benazepril (LOTENSIN) 40 MG tablet Take 1 tablet (40 mg total) by mouth daily.   ELIQUIS 5 MG TABS tablet Take 1 tablet (5 mg total) by mouth 2 (two) times daily.   finasteride (PROSCAR) 5 MG tablet Take 1 tablet (5 mg total) by mouth daily.   fluticasone (FLONASE) 50 MCG/ACT nasal spray Place 2 sprays into both nostrils 2 (two) times daily.   gabapentin (NEURONTIN) 300 MG capsule Take 1 capsule (300 mg total) by mouth 2 (two) times daily.   metFORMIN (GLUCOPHAGE-XR) 500 MG 24 hr tablet Take 2 tablets (1,000 mg total) by mouth 2 (two) times daily.   metoprolol succinate (TOPROL-XL) 25 MG 24 hr tablet Take 1 tablet by mouth in the morning and at bedtime.    pantoprazole (PROTONIX) 40 MG tablet Take 40 mg by mouth at bedtime.    propafenone (RYTHMOL SR) 425 MG 12 hr capsule Take 425 mg by mouth 2 (two) times daily.   rosuvastatin (CRESTOR) 5 MG tablet Take 1 tablet (5 mg total) by mouth daily.   solifenacin (VESICARE) 5 MG tablet Take 5 mg by mouth daily.   tamsulosin (FLOMAX) 0.4 MG CAPS capsule Take 0.8 mg by mouth daily.   No facility-administered encounter medications on file as of 03/16/2020.    Patient Active Problem List   Diagnosis Date Noted   Spinal cord stimulator status 12/11/2019   History of 2019 novel coronavirus disease (COVID-19) 10/02/2019   CKD (chronic kidney disease) stage 3, GFR 30-59 ml/min ([place]) 01/19/2019   Acquired thrombophilia ([place]) 01/19/2019   Failed back surgical syndrome  01/16/2019   Postlaminectomy syndrome, lumbar region 01/16/2019   History of fusion of lumbar spine (L2-L5) 01/16/2019   Chronic radicular lumbar pain 01/16/2019   HNP (herniated nucleus pulposus), lumbar 04/29/2018   Advanced care planning/counseling discussion 09/28/2016   Spinal stenosis, lumbar region, 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/13/2016   Hyperlipidemia associated with type 2 diabetes mellitus ([place]) 07/14/2015   Anemia 06/30/2015   BPH with  obstruction/lower urinary tract symptoms 06/02/2015   OSA (obstructive sleep apnea) 03/23/2015   Type 2 diabetes mellitus with diabetic chronic kidney disease ([place]) 01/05/2015   Hypertension associated with diabetes ([place]) 09/28/2014   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[place]) 09/28/2014   H/O prior ablation treatment 10/19/2011   Paroxysmal atrial fibrillation ([place]) 10/19/2011    Conditions to be addressed/monitored:HTN and Chronic Back pain, and bladder concerns  Care Plan : RNCM: Hypertension (Adult)  Updates made by Vanita Ingles since 03/16/2020 12:00 AM    Problem: RNCM: Hypertension (Hypertension)   Priority: Medium    Goal: RNCM: Hypertension Monitored   Priority: Medium  Note:   Objective:   Last practice recorded BP readings:  BP Readings from Last 3 Encounters:  02/26/20 117/74  02/24/20 (!) 157/81  02/17/20 (!) 158/69     Most recent eGFR/CrCl: No results found for: EGFR  No components found for: CRCL Current Barriers:   Knowledge Deficits related to basic understanding of hypertension pathophysiology and self care management  Knowledge Deficits related to understanding of medications prescribed for management of hypertension  Unable to independently manage HTN  Unable to perform IADLs independently- with back pain exacerbations sometimes he cannot do IADLs  Does not contact provider office for questions/concerns Case Manager Clinical Goal(s):   Over the next 120 days, patient will verbalize understanding of plan for hypertension management  Over the next 120 days, patient will attend all scheduled medical appointments: 05-26-2020  Over the next 120 days, patient will demonstrate improved adherence to prescribed treatment plan for hypertension as evidenced by taking all medications as prescribed, monitoring and recording blood pressure as directed, adhering to low sodium/DASH diet  Over the next 120 days, patient will demonstrate improved  health management independence as evidenced by checking blood pressure as directed and notifying PCP if SBP>160 or DBP > 90, taking all medications as prescribe, and adhering to a low sodium diet as discussed.  Over the next 120 days, patient will verbalize basic understanding of hypertension disease process and self health management plan as evidenced by compliance with heart healthy diet, compliance to medications, and working with the CCM team to maintain health and well being.  Interventions:   Collaboration with [name], NP regarding development and update of comprehensive plan of care as evidenced by provider attestation and co-signature  Inter-disciplinary care team collaboration (see longitudinal plan of care)  Evaluation of current treatment plan related to hypertension self management and patient's adherence to plan as established by provider.  Provided education to patient re: stroke prevention, s/s of heart attack and stroke, DASH diet, complications of uncontrolled blood pressure  Reviewed medications with patient and discussed importance of compliance  Discussed plans with patient for ongoing care management follow up and provided patient with direct contact information for care management team  Advised patient, providing education and rationale, to monitor blood pressure daily and record, calling PCP for findings outside established parameters.   Reviewed scheduled/upcoming provider appointments including: 05-26-2020 Patient Goals/Self-Care Activities  Over the next 120 days, patient will:  - Self administers medications  as prescribed Attends all scheduled provider appointments Calls provider office for new concerns, questions, or BP outside discussed parameters Checks BP and records as discussed Follows a low sodium diet/DASH diet - blood pressure trends reviewed - depression screen reviewed - home or ambulatory blood pressure monitoring encouraged Follow Up  Plan: Telephone follow up appointment with care management team member scheduled for: 06-01-2020   Task: RNCM: Identify and Monitor Blood Pressure Elevation   Note:   Care Management Activities:    - blood pressure trends reviewed - depression screen reviewed - home or ambulatory blood pressure monitoring encouraged      Care Plan : RNCM: Chronic Pain (Adult)  Updates made by Vanita Ingles since 03/16/2020 12:00 AM    Problem: RNCM: Pain Management Plan (Chronic Pain)   Priority: High    Goal: RNCM: Pain Management Plan Developed   Priority: High  Note:   Current Barriers:   Knowledge Deficits related to managing acute/chronic pain  Non-adherence to scheduled provider appointments  Non-adherence to prescribed medication regimen  Difficulty obtaining medications  Chronic Disease Management support and education needs related to chronic pain  Unable to independently manage pain exacerbations, has a pain stiumlator  Unable to perform IADLs independently  Does not contact provider office for questions/concerns  Nurse Case Manager Clinical Goal(s):   Over the next 120 days, patient will verbalize understanding of plan for managing pain  Over the next 120 days, patient will attend all scheduled medical appointments: 05-26-2020  Over the next  120  days patient will demonstrate use of different relaxation  skills and/or diversional activities to assist with pain reduction (distraction, imagery, relaxation, massage, acupressure, TENS, heat, and cold application  Over the next  120   days patient will report pain at a level less than 3 to 4 on a 10-10 rating scale  Over the next  120  days patient will use pharmacological and nonpharmacological pain relief strategies  Over the next  120 days patient will verbalize acceptable level of pain relief and ability to engage in desired activities  Over the next 120  days patient will engage in desired activities without an increase in  pain level  Interventions:   Collaboration with Marnee Guarneri T, NP regarding development and update of comprehensive plan of care as evidenced by provider attestation and co-signature  Inter-disciplinary care team collaboration (see longitudinal plan of care)  - deep breathing, relaxation and mindfulness use promoted  - effectiveness of pharmacologic therapy monitored  - motivation and barriers to change assessed and addressed  - pain assessed  - pain treatment goals reviewed  - participation in physical therapy encouraged  - premedication prior to activity encouraged  Evaluation of current treatment plan related to chronic pain and patient's adherence to plan as established by provider.  Advised patient to contact the pain specialist and discuss new onset of pain and see if the pain stimulator needs to be adjusted   Provided education to patient re: review of recent activity and safety.  Patient verbalized current symptoms started on Sunday. He is having to use his cane at this time due to pain exacerbation.   Reviewed medications with patient and discussed compliance  Discussed plans with patient for ongoing care management follow up and provided patient with direct contact information for care management team  Allow patient to maintain a diary of pain ratings, timing, precipitating events, medications, treatments, and what works best to relieve pain,   Refer to support groups and  self-help groups  Educate patient about the use of pharmacological interventions for pain management- antianxiety, antidepressants, NSAIDS, opioid analgesics,   Explain the importance of lifestyle modifications to effective pain management   Patient Goals/Self Care Activities:   Patient verbalizes understanding of plan to effectively manage pain and discomfort   Self-administers medications as prescribed  Attends all scheduled provider appointments  Calls pharmacy for medication  refills  Calls provider office for new concerns or questions  - mutually acceptable comfort goal set  - pain assessed  - pain treatment goals reviewed  - patient response to treatment assessed  - sharing of pain management plan with teachers and other caregivers encouraged  Follow Up Plan: Telephone follow up appointment with care management team member scheduled for: 06-01-2020 at 0900 am     Task: RNCM: Partner to Develop Chronic Pain Management Plan   Note:   Care Management Activities:    - mutually acceptable comfort goal set - pain assessed - pain treatment goals reviewed - patient response to treatment assessed - sharing of pain management plan with teachers and other caregivers encouraged    Notes: patient has a pain stimulator    Care Plan : RNCM: Urinary and Bladder Issues  Updates made by Vanita Ingles since 03/16/2020 12:00 AM    Problem: RNCM: Urinary and Bladder issuse   Priority: High    Goal: RNCM: Urinary and Bladder Concerns   Note:   Current Barriers:   Knowledge Deficits related to changes in bladder health and concerns with urgency and not being able to control patterns  Chronic Disease Management support and education needs related to Bladder atoney and changes after pain stimulator inserted   Unable to independently manage bladder health with issues of urgency and at times unable to void   Does not contact provider office for questions/concerns  Nurse Case Manager Clinical Goal(s):   Over the next 120 days, patient will verbalize understanding of plan for effective management of bladder health  Over the next 120 days, patient will work with [facility] and pcp to address needs related to changes in bladder patterns and assistance with care   Over the next 120 days, patient will demonstrate a decrease in urgency  exacerbations as evidenced by normalized bladder emptying schedule and normal bladder patterns  Over the next 120 days, patient will  attend all scheduled medical appointments: 05-26-2020  Over the next 120 days, patient will demonstrate improved adherence to prescribed treatment plan for bladder urgency/inability to void  as evidenced bycalling the office for changes, medications compliance, no evidence of UTI's or other infections   Interventions:   1:1 collaboration with [name], NP regarding development and update of comprehensive plan of care as evidenced by provider attestation and co-signature  Inter-disciplinary care team collaboration (see longitudinal plan of care)  Evaluation of current treatment plan related to bladder health and wellness  and patient's adherence to plan as established by provider.  Advised patient to call the office for changes in bladder health or questions   Provided education to patient re: safety, preventing infection, talking to provider for worsening condtions   Reviewed medications with patient and discussed compliance  Reviewed scheduled/upcoming provider appointments including: 05-26-2020  Discussed plans with patient for ongoing care management follow up and provided patient with direct contact information for care management team  Patient Goals/Self-Care Activities Over the next 120 days, patient will:  - Patient will self administer medications as prescribed Patient will attend all scheduled provider  appointments Patient will call pharmacy for medication refills Patient will attend church or other social activities Patient will continue to perform IADL's independently Patient will call provider office for new concerns or questions Patient will work with BSW to address care coordination needs and will continue to work with the clinical team to address health care and disease management related needs.   - education plan reviewed and/or amended - health literacy screen reviewed - patient's preferred learning methods utilized - privacy ensured - questions  encouraged - readiness to learn monitored  Follow Up Plan: Telephone follow up appointment with care management team member scheduled for: 06-01-2020 at 0900 am       Task: RNCM: Bladder health Education   Note:   Care Management Activities:    - education plan reviewed and/or amended - health literacy screen reviewed - patient's preferred learning methods utilized - privacy ensured - questions encouraged - readiness to learn monitored         Plan:Telephone follow up appointment with care management team member scheduled for:  06-01-2020 at 0900 am  [place], MSN, [facility] Mobile: [phone]

## 2020-03-16 NOTE — Therapy (Signed)
[facility] [address] [place], [place], [address] Phone: [phone]   Fax:  [phone]  Physical Therapy Treatment  Patient Details  Name: Sean Reyes. MRN: [id number] Date of Birth: November 01, 1940 Referring Provider (PT): Gillis Santa, MD   Encounter Date: 03/16/2020   PT End of Session - 03/16/20 1456    Visit Number 15    Number of Visits 25    Date for PT Re-Evaluation 03/25/20    Authorization Type eval: 01/01/20    PT Start Time 1433    PT Stop Time 1515    PT Time Calculation (min) 42 min    Activity Tolerance Patient tolerated treatment well    Behavior During Therapy [facility] for tasks assessed/performed           Past Medical History:  Diagnosis Date  . Anemia   . Anxiety   . Arthritis   . Atrial fibrillation ([place])   . Cataracts, bilateral   . Complication of anesthesia    pt reports low BP's after surgery at [facility] and difficulty awakening  . Depression   . Diabetes ([place])    dx 6-8 yrs ago  . Dysrhythmia    a-fib  . GERD (gastroesophageal reflux disease)    OCC TAKES ALKA SELTZER  . History of kidney stones    10-15 yrs ago  . 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 Hyperlipidemia   . Hypertension   . Nocturia   . S/P ablation of atrial fibrillation    Ablative therapy  . Sleep apnea    CPAP   . Tachycardia, unspecified     Past Surgical History:  Procedure Laterality Date  . ABLATION    . ANTERIOR LAT LUMBAR FUSION N/A 06/27/2017   Procedure: Anterior Lateral Lumbar Interbody  Fusion - Lumbar Two-Lumbar Three - Lumbar Three-Lumbar Four, Posterior Lumbar Interbody Fusion Lumbar Four- Five;  Surgeon: Kary Kos, MD;  Location: [place];  Service: Neurosurgery;  Laterality: N/A;  Anterior Lateral Lumbar Interbody  Fusion - Lumbar Two-Lumbar Three - Lumbar Three-Lumbar Four, Posterior Lumbar Interbody Fusion Lumbar Four- Five  . BACK SURGERY    . CARDIOVERSION N/A 08/29/2018    Procedure: CARDIOVERSION;  Surgeon: Corey Skains, MD;  Location: [facility];  Service: Cardiovascular;  Laterality: N/A;  . CARDIOVERSION N/A 09/24/2018   Procedure: CARDIOVERSION;  Surgeon: Corey Skains, MD;  Location: [facility];  Service: Cardiovascular;  Laterality: N/A;  . COLONOSCOPY WITH PROPOFOL N/A 10/05/2015   Procedure: COLONOSCOPY WITH PROPOFOL;  Surgeon: Lollie Sails, MD;  Location: [facility] ENDOSCOPY;  Service: Endoscopy;  Laterality: N/A;  . ESOPHAGOGASTRODUODENOSCOPY (EGD) WITH PROPOFOL N/A 04/01/2018   Procedure: ESOPHAGOGASTRODUODENOSCOPY (EGD) WITH PROPOFOL;  Surgeon: Lollie Sails, MD;  Location: [facility] ENDOSCOPY;  Service: Endoscopy;  Laterality: N/A;  . HERNIA REPAIR    . JOINT REPLACEMENT Bilateral    hips  RT+  LEFT X2   . LUMBAR LAMINECTOMY/DECOMPRESSION MICRODISCECTOMY Left 09/13/2016   Procedure: Microdiscectomy - Lumbar two-three,  Lumbar three- - left;  Surgeon: Kary Kos, MD;  Location: [place];  Service: Neurosurgery;  Laterality: Left;  . SPINAL CORD STIMULATOR INSERTION  07/08/2019  . TONSILLECTOMY      There were no vitals filed for this visit.   Subjective Assessment - 03/16/20 1440    Subjective Pt reports that he is doing alright today. He had a rough time last week with his bladder and an uncomfortable evening with  his back on Sunday. However today he denies any resting pain upon arrival. Also denies any recent L anterior thigh pain. Low back fatigue but no pain. No specific questions upon arrival.    Pertinent History Pt is a 80 y.o. male referred to PT for LBP. Pt has extensive PMH of failed back surgical syndrome, postlaminectomy syndrome, chronic radicular pain, hx of lumbar fusion spine, spinal cord stimulator, paroxysmal A-fib, DM-Type 2, HTN, chronic kidney disease. Pt reports 4 fusions on lumbar spine due to protruding disks which was ~2 years ago. Pt reports that his spine stimulator has started to help some but his biggest issue is his decreased  strength in his spine, legs, and core. In sitting pt has 0/10 NPS. With household distance pt has 4-5/10 NPS but is instantly relieved with sitting. With long walks, from the clinic to the car (100 yds)  8-9/10 NPS. The pain is described as dull pain. Pt denies radicular symptoms. [name] LBP is located acros his belt line right above his buttocks. Pt reports difficulty with his balance and asc/desc stairs but at home he uses no AD at home. Pt does report a fall 1.5 months ago with his cane and fell forward onto his head where he reports he could not catch himself going forward where he hit his head. This is his only fall with no injury. [name] big goal with PT is to be able to indep replace your deck.    How long can you sit comfortably? unlimited    How long can you stand comfortably? 4-5 min    How long can you walk comfortably? 4-5 min    Patient Stated Goals Indep rebuild deck.    Currently in Pain? No/denies               TREATMENT   Ther-ex NuStep L2 x 5 minutes for warm-up during history, seat position 13, arms 10, 4 minutes unbilled; Hooklying marches x 20 BLE; Hooklying lumbar rotation knee rocking x 1 minute; Hooklying lumbar rotation stretch 2 x 30s on each side; Hooklyingadductorsqueezewith manual resistance from therapistx 20BLE; Hooklying clams with manual resistance from therapist x 20 BLE; Hooklying bridgesx 20; SLR x 10 LLE, dc secondary to L hip flexor soreness, x 20 RLE; Sidelying hip abduction x 15 BLE; Sidelying clams with manual resistance from therapist x 15 BLE; Sidelying reverse clams x 15 BLE;   Manual Therapy Single knee-to-chest stretch 30shold BLE; Hip IR/ER stretch 30 sec. hold each BLE; Proximal and distal hamstring stretch 30 sec. hold each BLE; Sidelying hip flexor stretch x 30s bilateral; Rest breaks provided between stretches;   Pt educated throughout session about proper posture and technique with exercises. Improved exercise  technique, movement at target joints, use of target muscles after min to mod verbal, visual, tactile cues.    Patient demonstrates good motivation during session today. No L anterior thigh soreness during session. Incorporated hip flexor stretching again today. Session focused on supine/hooklying exercises due to back pain Sunday and yesterday. Will progress seated and standing strengthening exercises for functional carryover in future sessions as patient is able to tolerate. Patient encouraged to continue HEP and follow-up as scheduled. He has yet to achieve maximal benefit from PT services. Patient will continue benefit from skilled PT services to addressdeficits in strength and pain in order to return to full function at home.  PT Long Term Goals - 01/29/20 1304      PT LONG TERM GOAL #1   Title Pt will improve FOTO score to target goal (49) to demonstrate clinically significant improvement in functional mobility.    Baseline 01/29/20: 45; 01/06/20: 38;    Time 8    Period Weeks    Status Partially Met    Target Date 03/25/20      PT LONG TERM GOAL #2   Title Pt will improve 5xSTS score by at least 5 sec with no UE support to demonstrate clinically significant improvement in BLE strength.    Baseline 01/29/20: 15.9s 11/18: 27 sec with BUE suppport    Time 8    Period Weeks    Status Achieved    Target Date 03/25/20      PT LONG TERM GOAL #3   Title Pt will improve 2 MWT with no AD by 40' to demonstrate clinically significant improvement in endurance to improve household and community ambulation.    Baseline 01/29/20: Deferred due to elevated resting BP; 11/18: 175' no AD.    Time 8    Period Weeks    Status Deferred    Target Date 03/25/20      PT LONG TERM GOAL #4   Title Pt will ambulate with LRAD with normalized gait pattern > 5 min to reduce tripping hazard with shuffling gait.    Baseline 11/18: Pt amb with single  LSC and shuffling gait after 2 min due to fatigue,    Time 8    Period Weeks    Status Deferred    Target Date 03/25/20                 Plan - 03/16/20 1457    Clinical Impression Statement Patient demonstrates good motivation during session today. No L anterior thigh soreness during session. Incorporated hip flexor stretching again today. Session focused on supine/hooklying exercises due to back pain Sunday and yesterday. Will progress seated and standing strengthening exercises for functional carryover in future sessions as patient is able to tolerate. Patient encouraged to continue HEP and follow-up as scheduled.  He has yet to achieve maximal benefit from PT services.  Patient will continue benefit from skilled PT services to address deficits in strength and pain in order to return to full function at home.    Personal Factors and Comorbidities Age;Comorbidity 3+;Education;Past/Current Experience;Time since onset of injury/illness/exacerbation    Comorbidities spinal cord stimulator, paroxysmal A-fib, DM-Type 2, HTN, chronic kidney disease.    Examination-Activity Limitations Bed Mobility;Stand;Locomotion Level;Toileting;Bend;Reach Overhead;Carry;Squat;Hygiene/Grooming;Stairs    Examination-Participation Restrictions Occupation;Community Activity;Yard Work    Stability/Clinical Decision Making Unstable/Unpredictable    Rehab Potential Fair    PT Frequency 2x / week    PT Duration 8 weeks    PT Treatment/Interventions ADLs/Self Care Home Management;Moist Heat;Gait training;Stair training;Therapeutic activities;Therapeutic exercise;Balance training;Neuromuscular re-education;Patient/family education;Manual techniques;Functional mobility training;Cryotherapy    PT Next Visit Plan Reassess HEP, Improve LE strength and endurance.    PT Home Exercise Plan spinal cord stimulator, paroxysmal A-fib, DM-Type 2, HTN, chronic kidney disease.    Consulted and Agree with Plan of Care Patient            Patient will benefit from skilled therapeutic intervention in order to improve the following deficits and impairments:  Abnormal gait,Pain,Improper body mechanics,Decreased mobility,Postural dysfunction,Decreased activity tolerance,Decreased endurance,Decreased range of motion,Decreased strength,Decreased balance,Difficulty walking,Impaired flexibility  Visit Diagnosis: Chronic bilateral low back pain without sciatica  Muscle weakness (generalized)  Other abnormalities  of gait and mobility     Problem List Patient Active Problem List   Diagnosis Date Noted  . Spinal cord stimulator status 12/11/2019  . History of 2019 novel coronavirus disease (COVID-19) 10/02/2019  . CKD (chronic kidney disease) stage 3, GFR 30-59 ml/min (HCC) 01/19/2019  . Acquired thrombophilia ([place]) 01/19/2019  . 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 Postlaminectomy syndrome, lumbar region 01/16/2019  . History of fusion of lumbar spine (L2-L5) 01/16/2019  . Chronic radicular lumbar pain 01/16/2019  . HNP (herniated nucleus pulposus), lumbar 04/29/2018  . Advanced care planning/counseling discussion 09/28/2016  . Spinal stenosis, lumbar region, 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/13/2016  . Hyperlipidemia associated with type 2 diabetes mellitus ([place]) 07/14/2015  . Anemia 06/30/2015  . 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 OSA (obstructive sleep apnea) 03/23/2015  . Type 2 diabetes mellitus with diabetic chronic kidney disease ([place]) 01/05/2015  . Hypertension associated with diabetes ([place]) 09/28/2014  . 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[place]) 09/28/2014  . H/O prior ablation treatment 10/19/2011  . Paroxysmal atrial fibrillation ([place]) 10/19/2011   Phillips Grout PT, DPT, GCS  Sumner Boesch 03/17/2020, 12:40 PM   [facility] [address] [place], [place], [address] Phone: [phone]   Fax:  [phone]  Name:  Sean Reyes. MRN: [id number] Date of Birth: January 03, 1941

## 2020-03-18 ENCOUNTER — Ambulatory Visit: Payer: Medicare Other

## 2020-03-18 ENCOUNTER — Other Ambulatory Visit: Payer: Self-pay

## 2020-03-18 DIAGNOSIS — M545 Low back pain, unspecified: Secondary | ICD-10-CM

## 2020-03-18 DIAGNOSIS — M6281 Muscle weakness (generalized): Secondary | ICD-10-CM

## 2020-03-18 DIAGNOSIS — G8929 Other chronic pain: Secondary | ICD-10-CM

## 2020-03-18 NOTE — Therapy (Signed)
[facility] [address] [place], [place], [address] Phone: [phone]   Fax:  [phone]  Physical Therapy Treatment  Patient Details  Name: Sean Reyes. MRN: [id number] Date of Birth: 04/01/1940 Referring Provider (PT): Gillis Santa, MD   Encounter Date: 03/18/2020   PT End of Session - 03/18/20 1435    Visit Number 16    Number of Visits 25    Date for PT Re-Evaluation 03/25/20    Authorization Type eval: 01/01/20    PT Start Time 1430    PT Stop Time 1515    PT Time Calculation (min) 45 min    Activity Tolerance Patient tolerated treatment well    Behavior During Therapy [facility] for tasks assessed/performed           Past Medical History:  Diagnosis Date  . Anemia   . Anxiety   . Arthritis   . Atrial fibrillation ([place])   . Cataracts, bilateral   . Complication of anesthesia    pt reports low BP's after surgery at [facility] and difficulty awakening  . Depression   . Diabetes ([place])    dx 6-8 yrs ago  . Dysrhythmia    a-fib  . GERD (gastroesophageal reflux disease)    OCC TAKES ALKA SELTZER  . History of kidney stones    10-15 yrs ago  . 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 Hyperlipidemia   . Hypertension   . Nocturia   . S/P ablation of atrial fibrillation    Ablative therapy  . Sleep apnea    CPAP   . Tachycardia, unspecified     Past Surgical History:  Procedure Laterality Date  . ABLATION    . ANTERIOR LAT LUMBAR FUSION N/A 06/27/2017   Procedure: Anterior Lateral Lumbar Interbody  Fusion - Lumbar Two-Lumbar Three - Lumbar Three-Lumbar Four, Posterior Lumbar Interbody Fusion Lumbar Four- Five;  Surgeon: Kary Kos, MD;  Location: [place];  Service: Neurosurgery;  Laterality: N/A;  Anterior Lateral Lumbar Interbody  Fusion - Lumbar Two-Lumbar Three - Lumbar Three-Lumbar Four, Posterior Lumbar Interbody Fusion Lumbar Four- Five  . BACK SURGERY    . CARDIOVERSION N/A 08/29/2018    Procedure: CARDIOVERSION;  Surgeon: Corey Skains, MD;  Location: [facility];  Service: Cardiovascular;  Laterality: N/A;  . CARDIOVERSION N/A 09/24/2018   Procedure: CARDIOVERSION;  Surgeon: Corey Skains, MD;  Location: [facility];  Service: Cardiovascular;  Laterality: N/A;  . COLONOSCOPY WITH PROPOFOL N/A 10/05/2015   Procedure: COLONOSCOPY WITH PROPOFOL;  Surgeon: Lollie Sails, MD;  Location: [facility] END[facility]COPY;  Service: Endoscopy;  Laterality: N/A;  . ESOPHAGOGASTRODUODENOSCOPY (EGD) WITH PROPOFOL N/A 04/01/2018   Procedure: ESOPHAGOGASTRODUODENOSCOPY (EGD) WITH PROPOFOL;  Surgeon: Lollie Sails, MD;  Location: [facility] ENDOSCOPY;  Service: Endoscopy;  Laterality: N/A;  . HERNIA REPAIR    . JOINT REPLACEMENT Bilateral    hips  RT+  LEFT X2   . LUMBAR LAMINECTOMY/DECOMPRESSION MICRODISCECTOMY Left 09/13/2016   Procedure: Microdiscectomy - Lumbar two-three,  Lumbar three- - left;  Surgeon: Kary Kos, MD;  Location: [place];  Service: Neurosurgery;  Laterality: Left;  . SPINAL CORD STIMULATOR INSERTION  07/08/2019  . TONSILLECTOMY      There were no vitals filed for this visit.   Subjective Assessment - 03/18/20 1428    Subjective Pt reports that he is doing alright today. He had a great day yesterday and reports that he did a lot of walking.  His back and legs are stiff today and he reports 3/10 low back pain upon arrival. No specific questions upon arrival.    Pertinent History Pt is a 80 y.o. male referred to PT for LBP. Pt has extensive PMH of failed back surgical syndrome, postlaminectomy syndrome, chronic radicular pain, hx of lumbar fusion spine, spinal cord stimulator, paroxysmal A-fib, DM-Type 2, HTN, chronic kidney disease. Pt reports 4 fusions on lumbar spine due to protruding disks which was ~2 years ago. Pt reports that his spine stimulator has started to help some but his biggest issue is his decreased strength in his spine, legs, and core. In sitting pt has 0/10 NPS. With  household distance pt has 4-5/10 NPS but is instantly relieved with sitting. With long walks, from the clinic to the car (100 yds)  8-9/10 NPS. The pain is described as dull pain. Pt denies radicular symptoms. [name] LBP is located acros his belt line right above his buttocks. Pt reports difficulty with his balance and asc/desc stairs but at home he uses no AD at home. Pt does report a fall 1.5 months ago with his cane and fell forward onto his head where he reports he could not catch himself going forward where he hit his head. This is his only fall with no injury. [name] big goal with PT is to be able to indep replace your deck.    How long can you sit comfortably? unlimited    How long can you stand comfortably? 4-5 min    How long can you walk comfortably? 4-5 min    Patient Stated Goals Indep rebuild deck.    Currently in Pain? Yes    Pain Score 3     Pain Location Back    Pain Orientation Lower    Pain Descriptors / Indicators Aching    Pain Type Chronic pain    Pain Onset More than a month ago    Pain Frequency Intermittent                TREATMENT   Ther-ex NuStep L2 x 5 minutes for warm-up during history, seat position 13, arms 10, 4 minutes unbilled; Hooklying marches x 20 BLE; Hooklying lumbar rotation knee rocking x 1 minute; Hooklying lumbar rotation stretch 2 x 30s on each side; Hooklyingadductorsqueezewith manual resistance from therapistx 20BLE; Hooklying clams with manual resistance from therapist x 20 BLE; Hooklying bridgesx 20; SLR x 20 BLE, no LLE pain reported today.  Hooklying isometric manually resisted trunk rotation 5s hold x 10 each direction; Seated pball roll outs forward 5s hold x 20; Seated isometric alternating hand to knee 5s hold x 10 on each side; Sit to stand without UE support holding 5# dumbbell (DB) 2 x 10; 6" alternating step-ups without UE support; Resisted side stepping with green tband on ankles and BUE support x 12' x 4  lengths; Alternating forward lunges to BOSU (round side up) without UE support x 10 on each side;    Pt educated throughout session about proper posture and technique with exercises. Improved exercise technique, movement at target joints, use of target muscles after min to mod verbal, visual, tactile cues.    Patient demonstrates good motivation during session today. No L anterior thigh soreness during session.  Strengthening exercises during session today.  Patient is able to perform additional standing exercises including step ups, resisted sidestepping, and BOSU lunges. Will continue to progress seated and standing strengthening exercises for functional carryover in future sessions as patient is  able to tolerate. Patient encouraged to continue HEP and follow-up as scheduled. He has yet to achieve maximal benefit from PT services. Patient will continue benefit from skilled PT services to addressdeficits in strength and pain in order to return to full function at home.                                    PT Long Term Goals - 01/29/20 1304      PT LONG TERM GOAL #1   Title Pt will improve FOTO score to target goal (49) to demonstrate clinically significant improvement in functional mobility.    Baseline 01/29/20: 45; 01/06/20: 38;    Time 8    Period Weeks    Status Partially Met    Target Date 03/25/20      PT LONG TERM GOAL #2   Title Pt will improve 5xSTS score by at least 5 sec with no UE support to demonstrate clinically significant improvement in BLE strength.    Baseline 01/29/20: 15.9s 11/18: 27 sec with BUE suppport    Time 8    Period Weeks    Status Achieved    Target Date 03/25/20      PT LONG TERM GOAL #3   Title Pt will improve 2 MWT with no AD by 40' to demonstrate clinically significant improvement in endurance to improve household and community ambulation.    Baseline 01/29/20: Deferred due to elevated resting BP; 11/18: 175' no  AD.    Time 8    Period Weeks    Status Deferred    Target Date 03/25/20      PT LONG TERM GOAL #4   Title Pt will ambulate with LRAD with normalized gait pattern > 5 min to reduce tripping hazard with shuffling gait.    Baseline 11/18: Pt amb with single LSC and shuffling gait after 2 min due to fatigue,    Time 8    Period Weeks    Status Deferred    Target Date 03/25/20                 Plan - 03/18/20 1435    Clinical Impression Statement Patient demonstrates good motivation during session today. No L anterior thigh soreness during session.  Strengthening exercises during session today.  Patient is able to perform additional standing exercises including step ups, resisted sidestepping, and BOSU lunges. Will continue to progress seated and standing strengthening exercises for functional carryover in future sessions as patient is able to tolerate. Patient encouraged to continue HEP and follow-up as scheduled.  He has yet to achieve maximal benefit from PT services.  Patient will continue benefit from skilled PT services to address deficits in strength and pain in order to return to full function at home.    Personal Factors and Comorbidities Age;Comorbidity 3+;Education;Past/Current Experience;Time since onset of injury/illness/exacerbation    Comorbidities spinal cord stimulator, paroxysmal A-fib, DM-Type 2, HTN, chronic kidney disease.    Examination-Activity Limitations Bed Mobility;Stand;Locomotion Level;Toileting;Bend;Reach Overhead;Carry;Squat;Hygiene/Grooming;Stairs    Examination-Participation Restrictions Occupation;Community Activity;Yard Work    Stability/Clinical Decision Making Unstable/Unpredictable    Rehab Potential Fair    PT Frequency 2x / week    PT Duration 8 weeks    PT Treatment/Interventions ADLs/Self Care Home Management;Moist Heat;Gait training;Stair training;Therapeutic activities;Therapeutic exercise;Balance training;Neuromuscular  re-education;Patient/family education;Manual techniques;Functional mobility training;Cryotherapy    PT Next Visit Plan Progress HEP when appropriate, Improve LE strength and endurance.  PT Home Exercise Plan spinal cord stimulator, paroxysmal A-fib, DM-Type 2, HTN, chronic kidney disease.    Consulted and Agree with Plan of Care Patient           Patient will benefit from skilled therapeutic intervention in order to improve the following deficits and impairments:  Abnormal gait,Pain,Improper body mechanics,Decreased mobility,Postural dysfunction,Decreased activity tolerance,Decreased endurance,Decreased range of motion,Decreased strength,Decreased balance,Difficulty walking,Impaired flexibility  Visit Diagnosis: Chronic bilateral low back pain without sciatica  Muscle weakness (generalized)     Problem List Patient Active Problem List   Diagnosis Date Noted  . Spinal cord stimulator status 12/11/2019  . History of 2019 novel coronavirus disease (COVID-19) 10/02/2019  . CKD (chronic kidney disease) stage 3, GFR 30-59 ml/min (HCC) 01/19/2019  . Acquired thrombophilia ([place]) 01/19/2019  . 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 Postlaminectomy syndrome, lumbar region 01/16/2019  . History of fusion of lumbar spine (L2-L5) 01/16/2019  . Chronic radicular lumbar pain 01/16/2019  . HNP (herniated nucleus pulposus), lumbar 04/29/2018  . Advanced care planning/counseling discussion 09/28/2016  . Spinal stenosis, lumbar region, 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/13/2016  . Hyperlipidemia associated with type 2 diabetes mellitus ([place]) 07/14/2015  . Anemia 06/30/2015  . 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 OSA (obstructive sleep apnea) 03/23/2015  . Type 2 diabetes mellitus with diabetic chronic kidney disease ([place]) 01/05/2015  . Hypertension associated with diabetes ([place]) 09/28/2014  . 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[place]) 09/28/2014  . H/O prior  ablation treatment 10/19/2011  . Paroxysmal atrial fibrillation ([place]) 10/19/2011   Phillips Grout PT, DPT, GCS  Marciana Uplinger 03/18/2020, 4:25 PM  [facility] [address] [place], [place], [address] Phone: [phone]   Fax:  [phone]  Name: Sean Reyes. MRN: [id number] Date of Birth: Sep 25, 1940

## 2020-03-23 ENCOUNTER — Ambulatory Visit: Payer: Medicare Other

## 2020-03-26 DIAGNOSIS — M19031 Primary osteoarthritis, right wrist: Secondary | ICD-10-CM | POA: Diagnosis not present

## 2020-03-26 DIAGNOSIS — M25531 Pain in right wrist: Secondary | ICD-10-CM | POA: Diagnosis not present

## 2020-03-26 DIAGNOSIS — M11231 Other chondrocalcinosis, right wrist: Secondary | ICD-10-CM | POA: Diagnosis not present

## 2020-03-30 ENCOUNTER — Ambulatory Visit: Payer: Medicare Other

## 2020-03-30 DIAGNOSIS — H2513 Age-related nuclear cataract, bilateral: Secondary | ICD-10-CM | POA: Diagnosis not present

## 2020-03-31 DIAGNOSIS — Q6689 Other  specified congenital deformities of feet: Secondary | ICD-10-CM | POA: Diagnosis not present

## 2020-03-31 DIAGNOSIS — E1140 Type 2 diabetes mellitus with diabetic neuropathy, unspecified: Secondary | ICD-10-CM | POA: Diagnosis not present

## 2020-03-31 DIAGNOSIS — M2041 Other hammer toe(s) (acquired), right foot: Secondary | ICD-10-CM | POA: Diagnosis not present

## 2020-03-31 DIAGNOSIS — B351 Tinea unguium: Secondary | ICD-10-CM | POA: Diagnosis not present

## 2020-03-31 DIAGNOSIS — L851 Acquired keratosis [keratoderma] palmaris et plantaris: Secondary | ICD-10-CM | POA: Diagnosis not present

## 2020-03-31 DIAGNOSIS — M2042 Other hammer toe(s) (acquired), left foot: Secondary | ICD-10-CM | POA: Diagnosis not present

## 2020-03-31 DIAGNOSIS — L600 Ingrowing nail: Secondary | ICD-10-CM | POA: Diagnosis not present

## 2020-03-31 DIAGNOSIS — M778 Other enthesopathies, not elsewhere classified: Secondary | ICD-10-CM | POA: Diagnosis not present

## 2020-04-01 ENCOUNTER — Ambulatory Visit: Payer: Medicare Other

## 2020-04-01 ENCOUNTER — Other Ambulatory Visit: Payer: Self-pay

## 2020-04-01 DIAGNOSIS — R2689 Other abnormalities of gait and mobility: Secondary | ICD-10-CM

## 2020-04-01 DIAGNOSIS — G8929 Other chronic pain: Secondary | ICD-10-CM

## 2020-04-01 DIAGNOSIS — M545 Low back pain, unspecified: Secondary | ICD-10-CM

## 2020-04-01 DIAGNOSIS — M6281 Muscle weakness (generalized): Secondary | ICD-10-CM

## 2020-04-01 NOTE — Therapy (Signed)
[facility] [address] [place], [place], [address] Phone: [phone]   Fax:  [phone]  Physical Therapy Treatment/Recertification  Patient Details  Name: Sean Joines Jr. MRN: [id number] Date of Birth: 02/15/1940 Referring Provider (PT): Lateef, Bilal, MD   Encounter Date: 04/01/2020   PT End of Session - 04/01/20 1440    Visit Number 17    Number of Visits 41    Date for PT Re-Evaluation 05/27/20    Authorization Type eval: 01/01/20    PT Start Time 1435    PT Stop Time 1515    PT Time Calculation (min) 40 min    Activity Tolerance Patient tolerated treatment well    Behavior During Therapy WFL for tasks assessed/performed           Past Medical History:  Diagnosis Date  . Anemia   . Anxiety   . Arthritis   . Atrial fibrillation (HCC)   . Cataracts, bilateral   . Complication of anesthesia    pt reports low BP's after surgery at [facility] and difficulty awakening  . Depression   . Diabetes (HCC)    dx 6-8 yrs ago  . Dysrhythmia    a-fib  . GERD (gastroesophageal reflux disease)    OCC TAKES ALKA SELTZER  . History of kidney stones    10-15 yrs ago  . 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 Hyperlipidemia   . Hypertension   . Nocturia   . S/P ablation of atrial fibrillation    Ablative therapy  . Sleep apnea    CPAP   . Tachycardia, unspecified     Past Surgical History:  Procedure Laterality Date  . ABLATION    . ANTERIOR LAT LUMBAR FUSION N/A 06/27/2017   Procedure: Anterior Lateral Lumbar Interbody  Fusion - Lumbar Two-Lumbar Three - Lumbar Three-Lumbar Four, Posterior Lumbar Interbody Fusion Lumbar Four- Five;  Surgeon: Cram, Gary, MD;  Location: [facility];  Service: Neurosurgery;  Laterality: N/A;  Anterior Lateral Lumbar Interbody  Fusion - Lumbar Two-Lumbar Three - Lumbar Three-Lumbar Four, Posterior Lumbar Interbody Fusion Lumbar Four- Five  . BACK SURGERY    . CARDIOVERSION N/A  08/29/2018   Procedure: CARDIOVERSION;  Surgeon: Kowalski, Bruce J, MD;  Location: [facility] ORS;  Service: Cardiovascular;  Laterality: N/A;  . CARDIOVERSION N/A 09/24/2018   Procedure: CARDIOVERSION;  Surgeon: Kowalski, Bruce J, MD;  Location: [facility] ORS;  Service: Cardiovascular;  Laterality: N/A;  . COLONOSCOPY WITH PROPOFOL N/A 10/05/2015   Procedure: COLONOSCOPY WITH PROPOFOL;  Surgeon: Martin U Skulskie, MD;  Location: [facility] ENDOSCOPY;  Service: Endoscopy;  Laterality: N/A;  . ESOPHAGOGASTRODUODENOSCOPY (EGD) WITH PROPOFOL N/A 04/01/2018   Procedure: ESOPHAGOGASTRODUODENOSCOPY (EGD) WITH PROPOFOL;  Surgeon: Skulskie, Martin U, MD;  Location: [facility] ENDOSCOPY;  Service: Endoscopy;  Laterality: N/A;  . HERNIA REPAIR    . JOINT REPLACEMENT Bilateral    hips  RT+  LEFT X2   . LUMBAR LAMINECTOMY/DECOMPRESSION MICRODISCECTOMY Left 09/13/2016   Procedure: Microdiscectomy - Lumbar two-three,  Lumbar three- - left;  Surgeon: Cram, Gary, MD;  Location: [facility];  Service: Neurosurgery;  Laterality: Left;  . SPINAL CORD STIMULATOR INSERTION  07/08/2019  . TONSILLECTOMY      There were no vitals filed for this visit.   Subjective Assessment - 04/01/20 1439    Subjective Pt reports he is doing well today.  Has some pain, 4/10 while walking.  He is still feeling weak.      Pertinent History Pt is a 80 y.o. male referred to PT for LBP. Pt has extensive PMH of failed back surgical syndrome, postlaminectomy syndrome, chronic radicular pain, hx of lumbar fusion spine, spinal cord stimulator, paroxysmal A-fib, DM-Type 2, HTN, chronic kidney disease. Pt reports 4 fusions on lumbar spine due to protruding disks which was ~2 years ago. Pt reports that his spine stimulator has started to help some but his biggest issue is his decreased strength in his spine, legs, and core. In sitting pt has 0/10 NPS. With household distance pt has 4-5/10 NPS but is instantly relieved with sitting. With long walks, from the clinic to the car (100  yds)  8-9/10 NPS. The pain is described as dull pain. Pt denies radicular symptoms. [name] LBP is located acros his belt line right above his buttocks. Pt reports difficulty with his balance and asc/desc stairs but at home he uses no AD at home. Pt does report a fall 1.5 months ago with his cane and fell forward onto his head where he reports he could not catch himself going forward where he hit his head. This is his only fall with no injury. [name] big goal with PT is to be able to indep replace your deck.    How long can you sit comfortably? unlimited    How long can you stand comfortably? 4-5 min    How long can you walk comfortably? 4-5 min    Patient Stated Goals Indep rebuild deck.    Currently in Pain? No/denies   No resting pain upon arrival   Pain Onset --              TREATMENT     Ther-ex SciFit  x 5 minutes for warm-up during history, seat position 13, arms 10; Seated pball roll outs forward 5s hold x 20; Seated isometric alternating hand to knee 5s hold x 10 on each side; Sit to stand without UE support holding 5# dumbbell (DB) 2 x 10; Resisted side stepping with green tband on ankles and BUE support x 12' x 4 lengths; Hooklying marches 1 x 20 BLE, progressed to 1x20 BLE with manual resistance;  Hooklying lumbar rotation stretch 2 x 30s on each side; Hooklying adductor squeeze with manual resistance from therapist 2x12 BLE; Hooklying clams with manual resistance from therapist 2x12  BLE; Hooklying bridges x 20;  SLR x 20 BLE, no LLE pain reported today.  Hooklying isometric manually resisted trunk rotation 5s hold x 10 each direction; Hooklying arms above head resisted 4-way perturbations 3x30s       Pt educated throughout session about proper posture and technique with exercises. Improved exercise technique, movement at target joints, use of target muscles after min to mod verbal, visual, tactile cues.      Patient demonstrates good motivation during session today.   Deferred outcome measures and goals with patient today as he has missed the last few visits. Will update outcome measures at next visit. When goals were last updated pt had demonstrated significant improvement in his outcome measures with his FOTO score improving to 45 having started at 38 at initial evaluation.  His 5 Time Sit to Stand dropped considerably to 15.9 seconds.  Overall patient reports significant improvement in his lower extremity strength however his endurance remains limited. Minimal complaints of back pain at this time. Will continue to progress seated and standing strengthening exercises for functional carryover in future sessions as patient is able to tolerate. Patient encouraged to continue HEP and  follow-up as scheduled.  He has yet to achieve maximal benefit from PT services.  Patient will continue benefit from skilled PT services to addressdeficits in strength and pain in order to return to full function at home.                            PT Long Term Goals - 04/01/20 1526      PT LONG TERM GOAL #1   Title Pt will improve FOTO score to target goal (49) to demonstrate clinically significant improvement in functional mobility.    Baseline 01/29/20: 45; 01/06/20: 38;    Time 8    Period Weeks    Status Partially Met    Target Date 05/27/20      PT LONG TERM GOAL #2   Title Pt will improve 5xSTS score by at least 5 sec with no UE support to demonstrate clinically significant improvement in BLE strength.    Baseline 01/29/20: 15.9s 11/18: 27 sec with BUE suppport    Time 8    Period Weeks    Status Achieved    Target Date 05/27/20      PT LONG TERM GOAL #3   Title Pt will improve 2 MWT with no AD by 40' to demonstrate clinically significant improvement in endurance to improve household and community ambulation.    Baseline 01/29/20: Deferred due to elevated resting BP; 11/18: 175' no AD.    Time 8    Period Weeks    Status Deferred    Target  Date 05/27/20      PT LONG TERM GOAL #4   Title Pt will ambulate with LRAD with normalized gait pattern > 5 min to reduce tripping hazard with shuffling gait.    Baseline 11/18: Pt amb with single LSC and shuffling gait after 2 min due to fatigue,    Time 8    Period Weeks    Status Deferred    Target Date 05/27/20                 Plan - 04/01/20 1521    Clinical Impression Statement Patient demonstrates good motivation during session today.  Deferred outcome measures and goals with patient today as he has missed the last few visits. Will update outcome measures at next visit. When goals were last updated pt had demonstrated significant improvement in his outcome measures with his FOTO score improving to 45 having started at 38 at initial evaluation.  His 5 Time Sit to Stand dropped considerably to 15.9 seconds.  Overall patient reports significant improvement in his lower extremity strength however his endurance remains limited. Minimal complaints of back pain at this time. Will continue to progress seated and standing strengthening exercises for functional carryover in future sessions as patient is able to tolerate. Patient encouraged to continue HEP and follow-up as scheduled.  He has yet to achieve maximal benefit from PT services.  Patient will continue benefit from skilled PT services to address deficits in strength and pain in order to return to full function at home    Personal Factors and Comorbidities Age;Comorbidity 3+;Education;Past/Current Experience;Time since onset of injury/illness/exacerbation    Comorbidities spinal cord stimulator, paroxysmal A-fib, DM-Type 2, HTN, chronic kidney disease.    Examination-Activity Limitations Bed Mobility;Stand;Locomotion Level;Toileting;Bend;Reach Overhead;Carry;Squat;Hygiene/Grooming;Stairs    Examination-Participation Restrictions Occupation;Community Activity;Yard Work    Stability/Clinical Decision Making Unstable/Unpredictable     Rehab Potential Fair    PT Frequency 2x / week  PT Duration 8 weeks    PT Treatment/Interventions ADLs/Self Care Home Management;Moist Heat;Gait training;Stair training;Therapeutic activities;Therapeutic exercise;Balance training;Neuromuscular re-education;Patient/family education;Manual techniques;Functional mobility training;Cryotherapy    PT Next Visit Plan Progress HEP when appropriate, Improve LE strength and endurance.    PT Home Exercise Plan spinal cord stimulator, paroxysmal A-fib, DM-Type 2, HTN, chronic kidney disease.    Consulted and Agree with Plan of Care Patient           Patient will benefit from skilled therapeutic intervention in order to improve the following deficits and impairments:  Abnormal gait,Pain,Improper body mechanics,Decreased mobility,Postural dysfunction,Decreased activity tolerance,Decreased endurance,Decreased range of motion,Decreased strength,Decreased balance,Difficulty walking,Impaired flexibility  Visit Diagnosis: Chronic bilateral low back pain without sciatica  Muscle weakness (generalized)  Other abnormalities of gait and mobility     Problem List Patient Active Problem List   Diagnosis Date Noted  . Spinal cord stimulator status 12/11/2019  . History of 2019 novel coronavirus disease (COVID-19) 10/02/2019  . CKD (chronic kidney disease) stage 3, GFR 30-59 ml/min (HCC) 01/19/2019  . Acquired thrombophilia (HCC) 01/19/2019  . 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 Postlaminectomy syndrome, lumbar region 01/16/2019  . History of fusion of lumbar spine (L2-L5) 01/16/2019  . Chronic radicular lumbar pain 01/16/2019  . HNP (herniated nucleus pulposus), lumbar 04/29/2018  . Advanced care planning/counseling discussion 09/28/2016  . Spinal stenosis, lumbar region, 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/13/2016  . Hyperlipidemia associated with type 2 diabetes mellitus (HCC) 07/14/2015  . Anemia 06/30/2015  . BPH with obstruction/lower  urinary tract symptoms 06/02/2015  . OSA (obstructive sleep apnea) 03/23/2015  . Type 2 diabetes mellitus with diabetic chronic kidney disease (HCC) 01/05/2015  . Hypertension associated with diabetes (HCC) 09/28/2014  . 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HCC) 09/28/2014  . H/O prior ablation treatment 10/19/2011  . Paroxysmal atrial fibrillation (HCC) 10/19/2011   This entire session was performed under direct supervision and direction of a licensed therapist/therapist assistant . I have personally read, edited and approve of the note as written.   Annie Michaelson, SPT  Jason D Huprich PT, DPT, GCS  Huprich,Jason 04/01/2020, 9:16 PM  [facility] [address] [place], [place], [address] Phone: [phone]   Fax:  [phone]  Name: Sean Ciancio Jr. MRN: [id number] Date of Birth: 05/17/1940   

## 2020-04-06 ENCOUNTER — Ambulatory Visit: Payer: Medicare Other

## 2020-04-06 ENCOUNTER — Other Ambulatory Visit: Payer: Self-pay

## 2020-04-06 DIAGNOSIS — M545 Low back pain, unspecified: Secondary | ICD-10-CM

## 2020-04-06 DIAGNOSIS — M6281 Muscle weakness (generalized): Secondary | ICD-10-CM

## 2020-04-06 DIAGNOSIS — G8929 Other chronic pain: Secondary | ICD-10-CM

## 2020-04-06 DIAGNOSIS — R2689 Other abnormalities of gait and mobility: Secondary | ICD-10-CM

## 2020-04-06 NOTE — Therapy (Signed)
[facility] [address] [place], [place], [address] Phone: [phone]   Fax:  [phone]  Physical Therapy Treatment  Patient Details  Name: Sean Reyes. MRN: [id number] Date of Birth: [birth year] Referring Provider (PT): Gillis Santa, MD   Encounter Date: 04/06/2020    Past Medical History:  Diagnosis Date  . Anemia   . Anxiety   . Arthritis   . Atrial fibrillation ([place])   . Cataracts, bilateral   . Complication of anesthesia    pt reports low BP's after surgery at [facility] and difficulty awakening  . Depression   . Diabetes ([place])    dx 6-8 yrs ago  . Dysrhythmia    a-fib  . GERD (gastroesophageal reflux disease)    OCC TAKES ALKA SELTZER  . History of kidney stones    10-15 yrs ago  . 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 Hyperlipidemia   . Hypertension   . Nocturia   . S/P ablation of atrial fibrillation    Ablative therapy  . Sleep apnea    CPAP   . Tachycardia, unspecified     Past Surgical History:  Procedure Laterality Date  . ABLATION    . ANTERIOR LAT LUMBAR FUSION N/A 06/27/2017   Procedure: Anterior Lateral Lumbar Interbody  Fusion - Lumbar Two-Lumbar Three - Lumbar Three-Lumbar Four, Posterior Lumbar Interbody Fusion Lumbar Four- Five;  Surgeon: Kary Kos, MD;  Location: [place];  Service: Neurosurgery;  Laterality: N/A;  Anterior Lateral Lumbar Interbody  Fusion - Lumbar Two-Lumbar Three - Lumbar Three-Lumbar Four, Posterior Lumbar Interbody Fusion Lumbar Four- Five  . BACK SURGERY    . CARDIOVERSION N/A 08/29/2018   Procedure: CARDIOVERSION;  Surgeon: Corey Skains, MD;  Location: [facility];  Service: Cardiovascular;  Laterality: N/A;  . CARDIOVERSION N/A 09/24/2018   Procedure: CARDIOVERSION;  Surgeon: Corey Skains, MD;  Location: [facility];  Service: Cardiovascular;  Laterality: N/A;  . COLONOSCOPY WITH PROPOFOL N/A 10/05/2015   Procedure: COLONOSCOPY WITH PROPOFOL;   Surgeon: Lollie Sails, MD;  Location: [facility] ENDOSCOPY;  Service: Endoscopy;  Laterality: N/A;  . ESOPHAGOGASTRODUODENOSCOPY (EGD) WITH PROPOFOL N/A 04/01/2018   Procedure: ESOPHAGOGASTRODUODENOSCOPY (EGD) WITH PROPOFOL;  Surgeon: Lollie Sails, MD;  Location: [facility] ENDOSCOPY;  Service: Endoscopy;  Laterality: N/A;  . HERNIA REPAIR    . JOINT REPLACEMENT Bilateral    hips  RT+  LEFT X2   . LUMBAR LAMINECTOMY/DECOMPRESSION MICRODISCECTOMY Left 09/13/2016   Procedure: Microdiscectomy - Lumbar two-three,  Lumbar three- - left;  Surgeon: Kary Kos, MD;  Location: [place];  Service: Neurosurgery;  Laterality: Left;  . SPINAL CORD STIMULATOR INSERTION  07/08/2019  . TONSILLECTOMY      There were no vitals filed for this visit.   Ther-ex Hooklying marches 2x20 BLE with manual resistance;  Hooklying lumbar rotation stretch 2 x 30s on each side; Hooklying adductor squeeze with ball, 2x12 BLE; Side lying clams with manual resistance from therapist 2x10  BLE; Side lying hip abduction with manual resistance from therapist 2x10 BLE  SLR 2x10 with manual resistance from therapist .   Manual therapy: STM to persipinals and inferior lats     MHP applied to neck and upper traps throughout duration of session to provide some relief to the neck.    Pt educated throughout session about proper posture and technique with exercises. Improved exercise technique, movement at target joints, use of target muscles after min to mod  verbal, visual, tactile cues.     Patient was very limited by neck pain today that started about a week ago.  He was given advice about gentle range of motion and heat pack for the neck pain and stiffness.  Deferred outcome measures and goals with patient today due to functional limitations from neck pain. Worked on some perispinal soft tissue to relieve tightness and TP tenderness.  Patient was able to complete some LE strengthening exercises in side lying, supine and hooklying,  which he tolerated well. Minimal complaints of back pain at this time. Will continue to progress seated and standing strengthening exercises for functional carryover in future sessions as patient is able to tolerate. Patient encouraged to continue HEP and follow-up as scheduled.  He has yet to achieve maximal benefit from PT services. Patient will continue benefit from skilled PT services to addressdeficits in strength and pain in order to return to full function at home.         PT Long Term Goals - 04/01/20 1526      PT LONG TERM GOAL #1   Title Pt will improve FOTO score to target goal (49) to demonstrate clinically significant improvement in functional mobility.    Baseline 01/29/20: 45; 01/06/20: 38;    Time 8    Period Weeks    Status Partially Met    Target Date 05/27/20      PT LONG TERM GOAL #2   Title Pt will improve 5xSTS score by at least 5 sec with no UE support to demonstrate clinically significant improvement in BLE strength.    Baseline 01/29/20: 15.9s 11/18: 27 sec with BUE suppport    Time 8    Period Weeks    Status Achieved    Target Date 05/27/20      PT LONG TERM GOAL #3   Title Pt will improve 2 MWT with no AD by 40' to demonstrate clinically significant improvement in endurance to improve household and community ambulation.    Baseline 01/29/20: Deferred due to elevated resting BP; 11/18: 175' no AD.    Time 8    Period Weeks    Status Deferred    Target Date 05/27/20      PT LONG TERM GOAL #4   Title Pt will ambulate with LRAD with normalized gait pattern > 5 min to reduce tripping hazard with shuffling gait.    Baseline 11/18: Pt amb with single LSC and shuffling gait after 2 min due to fatigue,    Time 8    Period Weeks    Status Deferred    Target Date 05/27/20                  Patient will benefit from skilled therapeutic intervention in order to improve the following deficits and impairments:     Visit Diagnosis: No diagnosis  found.     Problem List Patient Active Problem List   Diagnosis Date Noted  . Spinal cord stimulator status 12/11/2019  . History of 2019 novel coronavirus disease (COVID-19) 10/02/2019  . CKD (chronic kidney disease) stage 3, GFR 30-59 ml/min (HCC) 01/19/2019  . Acquired thrombophilia ([place]) 01/19/2019  . 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 Postlaminectomy syndrome, lumbar region 01/16/2019  . History of fusion of lumbar spine (L2-L5) 01/16/2019  . Chronic radicular lumbar pain 01/16/2019  . HNP (herniated nucleus pulposus), lumbar 04/29/2018  . Advanced care planning/counseling discussion 09/28/2016  . Spinal stenosis, lumbar region, 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/13/2016  .  Hyperlipidemia associated with type 2 diabetes mellitus ([place]) 07/14/2015  . Anemia 06/30/2015  . 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 OSA (obstructive sleep apnea) 03/23/2015  . Type 2 diabetes mellitus with diabetic chronic kidney disease ([place]) 01/05/2015  . Hypertension associated with diabetes ([place]) 09/28/2014  . 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[place]) 09/28/2014  . H/O prior ablation treatment 10/19/2011  . Paroxysmal atrial fibrillation ([place]) 10/19/2011   Rosalee Kaufman, SPT  Knute Neu 04/06/2020, 2:33 PM  [facility] [address] [place], [place], [address] Phone: [phone]   Fax:  [phone]  Name: Sean Reyes. MRN: [id number] Date of Birth: 05/31/1940

## 2020-04-08 ENCOUNTER — Encounter: Payer: Self-pay | Admitting: Student in an Organized Health Care Education/Training Program

## 2020-04-08 ENCOUNTER — Other Ambulatory Visit: Payer: Self-pay

## 2020-04-08 ENCOUNTER — Ambulatory Visit: Payer: Medicare Other

## 2020-04-08 ENCOUNTER — Ambulatory Visit
Payer: Medicare Other | Attending: Student in an Organized Health Care Education/Training Program | Admitting: Student in an Organized Health Care Education/Training Program

## 2020-04-08 VITALS — BP 141/74 | HR 56 | Temp 97.1°F | Ht 73.0 in | Wt 189.0 lb

## 2020-04-08 VITALS — BP 144/63 | HR 55

## 2020-04-08 DIAGNOSIS — M48062 Spinal stenosis, lumbar region with neurogenic claudication: Secondary | ICD-10-CM | POA: Diagnosis not present

## 2020-04-08 DIAGNOSIS — M5416 Radiculopathy, lumbar region: Secondary | ICD-10-CM | POA: Insufficient documentation

## 2020-04-08 DIAGNOSIS — M47812 Spondylosis without myelopathy or radiculopathy, cervical region: Secondary | ICD-10-CM | POA: Diagnosis not present

## 2020-04-08 DIAGNOSIS — M961 Postlaminectomy syndrome, not elsewhere classified: Secondary | ICD-10-CM | POA: Diagnosis not present

## 2020-04-08 DIAGNOSIS — G8929 Other chronic pain: Secondary | ICD-10-CM | POA: Diagnosis not present

## 2020-04-08 DIAGNOSIS — M545 Low back pain, unspecified: Secondary | ICD-10-CM

## 2020-04-08 DIAGNOSIS — Z981 Arthrodesis status: Secondary | ICD-10-CM | POA: Diagnosis not present

## 2020-04-08 DIAGNOSIS — M6281 Muscle weakness (generalized): Secondary | ICD-10-CM

## 2020-04-08 DIAGNOSIS — M542 Cervicalgia: Secondary | ICD-10-CM | POA: Insufficient documentation

## 2020-04-08 DIAGNOSIS — R2689 Other abnormalities of gait and mobility: Secondary | ICD-10-CM

## 2020-04-08 DIAGNOSIS — Z9689 Presence of other specified functional implants: Secondary | ICD-10-CM | POA: Diagnosis not present

## 2020-04-08 NOTE — Progress Notes (Signed)
PROVIDER NOTE: Information contained herein reflects review and annotations entered in association with encounter. Interpretation of such information and data should be left to medically-trained personnel. Information provided to patient can be located elsewhere in the medical record under "Patient Instructions". Document created using STT-dictation technology, any transcriptional errors that may result from process are unintentional.    Patient: Sean Reyes.  Service Category: E/M  Provider: Gillis Santa, MD  DOB: [birth year]  DOS: 04/08/2020  Specialty: Interventional Pain Management  MRN: [id number]  Setting: Ambulatory outpatient  PCP: [name], NP  Type: Established Patient    Referring Provider: [name], NP  Location: Office  Delivery: Face-to-face     HPI  [name]., a 80 y.o. 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 Sean Reyes primary complain today is Back Pain Last encounter: My last encounter with him was on 12/11/2019. Pertinent problems: Sean Reyes has H/O prior ablation treatment; Paroxysmal atrial fibrillation ([place]); Type 2 diabetes mellitus with diabetic chronic kidney disease ([place]); Spinal stenosis, lumbar region, with neurogenic claudication; HNP (herniated nucleus pulposus), lumbar; Failed back surgical syndrome; Postlaminectomy syndrome, lumbar region; History of fusion of lumbar spine (L2-L5); Chronic radicular lumbar pain; and Spinal cord stimulator status on their pertinent problem list. Pain Assessment: Severity of Chronic pain is reported as a 1 /10. Location: Back Lower/Denies. Onset: More than a month ago. Quality: Aching,Burning. Timing: Intermittent. Modifying factor(s):  [name] Vitals:  height is 6' 1"  (1.854 m) and weight is 189 lb (85.7 kg). His temperature is 97.1 F (36.2 C) (abnormal). His blood pressure is 141/74 (abnormal) and his pulse is 56 (abnormal). His oxygen saturation is 100%.   Reason  for encounter: follow-up evaluation    Patient states that overall he is doing well.  He is working with physical therapy regularly for his low back pain.  Patient has a history of L2-S1 spinal fusion.  He is utilizing his spinal cord stimulator and feels that it is helping out somewhat.  He states that physical therapy has improved his range of motion and strength.  He still feels somewhat deconditioned.  He is having bilateral neck pain that has been fairly severe over the last week.  He states that this morning he is feeling slightly better.  He has limited range of motion of his bilateral neck and it causes pain localized to his neck and proximal trapezius region.  Discussed cervical spine x-rays and considering diagnostic cervical facet medial branch nerve blocks or possible cervical paraspinal/trapezius trigger point injections.  Patient states that given improvement in his acute neck pain, will hold off.  I will place as needed order for diagnostic cervical facet blocks and recommend the patient obtain a cervical spine x-ray.  Patient endorsed understanding.  Otherwise continue with spinal cord stimulation.   ROS  Constitutional: Denies any fever or chills Gastrointestinal: No reported hemesis, hematochezia, vomiting, or acute GI distress Musculoskeletal: Bilateral neck, low back pain Neurological: No reported episodes of acute onset apraxia, aphasia, dysarthria, agnosia, amnesia, paralysis, loss of coordination, or loss of consciousness  Medication Review  acetaminophen, amLODIPine Besylate, apixaban, benazepril, finasteride, fluticasone, gabapentin, metFORMIN, metoprolol succinate, pantoprazole, propafenone, rosuvastatin, solifenacin, and tamsulosin  History Review  Allergy: Sean Reyes is allergic to levaquin [levofloxacin in d5w], shellfish allergy, amiodarone, and adhesive [tape]. Drug: Sean Reyes  reports no history of drug use. Alcohol:  reports no history of alcohol use. Tobacco:   reports that he has never  smoked. He has never used smokeless tobacco. Social: Sean Reyes  reports that he has never smoked. He has never used smokeless tobacco. He reports that he does not drink alcohol and does not use drugs. Medical:  has a past medical history of Anemia, Anxiety, Arthritis, Atrial fibrillation ([place]), Cataracts, bilateral, Complication of anesthesia, Depression, Diabetes ([place]), Dysrhythmia, GERD (gastroesophageal reflux disease), History of kidney stones, HOH (hard of hearing), Hyperlipidemia, Hypertension, Nocturia, S/P ablation of atrial fibrillation, Sleep apnea, and Tachycardia, unspecified. Surgical: Sean Reyes  has a past surgical history that includes Tonsillectomy; Hernia repair; Ablation; Colonoscopy with propofol (N/A, 10/05/2015); Lumbar laminectomy/decompression microdiscectomy (Left, 09/13/2016); Anterior lat lumbar fusion (N/A, 06/27/2017); Joint replacement (Bilateral); Back surgery; Esophagogastroduodenoscopy (egd) with propofol (N/A, 04/01/2018); Cardioversion (N/A, 08/29/2018); Cardioversion (N/A, 09/24/2018); and Spinal cord stimulator insertion (07/08/2019). Family: family history includes Brain cancer in his mother.  Laboratory Chemistry Profile   Renal Lab Results  Component Value Date   BUN 17 02/26/2020   CREATININE 1.25 02/26/2020   BCR 14 02/26/2020   GFRAA 64 02/26/2020   GFRNONAA 55 (L) 02/26/2020     Hepatic Lab Results  Component Value Date   AST 13 07/30/2019   ALT 10 07/30/2019   ALBUMIN 4.1 07/30/2019   ALKPHOS 71 07/30/2019   LIPASE 25 12/19/2018     Electrolytes Lab Results  Component Value Date   NA 138 02/26/2020   K 4.5 02/26/2020   CL 99 02/26/2020   CALCIUM 9.6 02/26/2020   MG 2.1 07/23/2018     Bone Lab Results  Component Value Date   VD25OH 41.7 02/26/2020   TESTOFREE 6.0 (L) 09/01/2015   TESTOSTERONE 642 12/11/2016     Inflammation (CRP: Acute Phase) (ESR: Chronic Phase) No results found for: CRP, ESRSEDRATE,  LATICACIDVEN     Note: Above Lab results reviewed.  Recent Imaging Review  DG SWALLOW FUNC OP MEDICARE SPEECH PATH Please refer to "Notes" tab for Speech Pathology notes.  CLINICAL DATA:  Dysphagia. Cough/GE reflux disease/other secondary diagnosis  EXAM: MODIFIED BARIUM SWALLOW  TECHNIQUE: Different consistencies of barium were administered orally to the patient by the Speech Pathologist. Imaging of the pharynx was performed in the lateral projection. The radiologist was present in the fluoroscopy room for this study, providing personal supervision.  FLUOROSCOPY TIME:  Fluoroscopy Time:  1.5 minute  Radiation Exposure Index (if provided by the fluoroscopic device): 6.1 mGy  Number of Acquired Spot Images: 0  COMPARISON:  None.  FINDINGS: Real-time fluoroscopy of the swallowing function was performed with a speech pathologist present.  Multiple consistencies of barium were administered which included water, nectar, applesauce, and Graham cracker.  Normal swallow function. Flash laryngeal penetration. No tracheal aspiration.  IMPRESSION: Modified barium swallow as described above.  Please refer to the Speech Pathologists report for complete details and recommendations.  Electronically Signed   By: Kathreen Devoid   On: 11/05/2019 15:32 Note: Reviewed        Physical Exam  General appearance: Well nourished, well developed, and well hydrated. In no apparent acute distress Mental status: Alert, oriented x 3 (person, place, & time)       Respiratory: No evidence of acute respiratory distress Eyes: PERLA Vitals: BP (!) 141/74   Pulse (!) 56   Temp (!) 97.1 F (36.2 C)   Ht 6' 1"  (1.854 m)   Wt 189 lb (85.7 kg)   SpO2 100%   BMI 24.94 kg/m  BMI: Estimated body mass index is 24.94 kg/m as calculated from the  following:   Height as of this encounter: 6' 1"  (1.854 m).   Weight as of this encounter: 189 lb (85.7 kg). Ideal: Ideal body weight: 79.9 kg (176 lb 2.4  oz) Adjusted ideal body weight: 82.2 kg (181 lb 4.6 oz)  Cervical Spine Area Exam  Skin & Axial Inspection: No masses, redness, edema, swelling, or associated skin lesions Alignment: Symmetrical Functional ROM: Pain restricted ROM, bilaterally Stability: No instability detected Muscle Tone/Strength: Functionally intact. No obvious neuro-muscular anomalies detected. Sensory (Neurological): Musculoskeletal pain pattern Palpation: Complains of area being tender to palpation             5 out of 5 strength bilateral upper extremity: Shoulder abduction, elbow flexion, elbow extension, thumb extension.   Lumbar Spine Area Exam  Skin & Axial Inspection: Well healed scar from previous spine surgery detected Alignment: Scoliosis detected Functional ROM: Pain restricted ROM and mechanically restricted       Stability: No instability detected Muscle Tone/Strength: Functionally intact. No obvious neuro-muscular anomalies detected. Sensory (Neurological): Musculoskeletal pain pattern  Gait & Posture Assessment  Ambulation: Limited Gait: Antalgic Posture: Difficulty standing up straight, due to pain  Lower Extremity Exam    Side: Right lower extremity  Side: Left lower extremity  Stability: No instability observed          Stability: No instability observed          Skin & Extremity Inspection: Skin color, temperature, and hair growth are WNL. No peripheral edema or cyanosis. No masses, redness, swelling, asymmetry, or associated skin lesions. No contractures.  Skin & Extremity Inspection: Skin color, temperature, and hair growth are WNL. No peripheral edema or cyanosis. No masses, redness, swelling, asymmetry, or associated skin lesions. No contractures.  Functional ROM: Pain restricted ROM for hip joint          Functional ROM: Pain restricted ROM for hip joint          Muscle Tone/Strength: Functionally intact. No obvious neuro-muscular anomalies detected.  Muscle Tone/Strength: Functionally  intact. No obvious neuro-muscular anomalies detected.  Sensory (Neurological): Unimpaired        Sensory (Neurological): Unimpaired        DTR: Patellar: deferred today Achilles: deferred today Plantar: deferred today  DTR: Patellar: deferred today Achilles: deferred today Plantar: deferred today  Palpation: No palpable anomalies  Palpation: No palpable anomalies    Assessment   Status Diagnosis  Having a Flare-up Having a Flare-up Controlled 1. Cervical facet joint syndrome   2. Cervicalgia   3. Failed back surgical syndrome   4. Postlaminectomy syndrome, lumbar region   5. Chronic radicular lumbar pain   6. History of fusion of lumbar spine (L2-L5)   7. Spinal stenosis, lumbar region, with neurogenic claudication   8. Spinal cord stimulator status      Updated Problems: Problem  Cervical Facet Joint Syndrome  Cervicalgia    Plan of Care   Sean Reyes. has a current medication list which includes the following long-term medication(s): benazepril, eliquis, fluticasone, gabapentin, metformin, propafenone, and rosuvastatin.   Orders:  Orders Placed This Encounter  Procedures  . CERVICAL FACET (MEDIAL BRANCH NERVE BLOCK)     CLINICAL INDICATIONS: Neck pain. Indications: Cervical Facet Syndrome.    Standing Status:   Standing    Number of Occurrences:   2    Standing Expiration Date:   04/08/2021    Scheduling Instructions:     LATERALITY: TBD     Level: C4-5, C5-6 Facet joints (C4,  C5, C6, Medial Branch Nerves)     SEDATION: Patient's choice.     TIMEFRAME: PRN procedure. (Sean Reyes will call when needed.)    Order Specific Question:   Where will this procedure be performed?    Answer:   [facility]  . DG Cervical Spine Complete    Patient presents with axial pain with possible radicular component.  In addition to any acute findings, please report on:  1. Facet (Zygapophyseal) joint DJD (Hypertrophy, space narrowing, subchondral sclerosis, and/or  osteophyte formation) 2. DDD and/or IVDD (Loss of disc height, desiccation or "Black disc disease") 3. Pars defects 4. Spondylolisthesis, spondylosis, and/or spondyloarthropathies (include Degree/Grade of displacement in mm) 5. Vertebral body Fractures, including age (old, new/acute) 4. Modic Type Changes 7. Demineralization 8. Bone pathology 9. Central, Lateral Recess, and/or Foraminal Stenosis (include AP diameter of stenosis in mm) 10. Surgical changes (hardware type, status, and presence of fibrosis) NOTE: Please specify level(s) and laterality.    Standing Status:   Future    Standing Expiration Date:   07/06/2020    Scheduling Instructions:     Please describe presence and specific location (Level & Laterality) of any signs of osteoarthritis, zygapophyseal (Facet) joints DJD (including decreased joint space and/or osteophytosis), DDD, Foraminal narrowing, as well as any sclerosis and/or cyst formation.    Order Specific Question:   Reason for Exam (SYMPTOM  OR DIAGNOSIS REQUIRED)    Answer:   Cervicalgia    Order Specific Question:   Preferred imaging location?    Answer:   [facility]    Order Specific Question:   Call Results- Best Contact Number?    Answer:   [phone] [facility])   Follow-up plan:   Return if symptoms worsen or fail to improve.     Status post caudal 02/03/2019: 6 cc injected- not helpful, status post Nevro spinal cord stimulator implant with Dr. Mearl Latin at [place] Visits No visits were found meeting these conditions. Showing recent visits within past 90 days and meeting all other requirements Today's Visits Date Type Provider Dept  04/08/20 Office Visit Gillis Santa, MD [facility]  Showing today's visits and meeting all other requirements Future Appointments No visits were found meeting these conditions. Showing future appointments within next 90 days and meeting all other requirements  I discussed the assessment and  treatment plan with the patient. The patient was provided an opportunity to ask questions and all were answered. The patient agreed with the plan and demonstrated an understanding of the instructions.  Patient advised to call back or seek an in-person evaluation if the symptoms or condition worsens.  Duration of encounter: 34mnutes.  Note by: BGillis Santa MD Date: 04/08/2020; Time: 11:29 AM

## 2020-04-08 NOTE — Therapy (Signed)
[facility] [address] [place], [place], [address] Phone: [phone]   Fax:  [phone]  Physical Therapy Treatment/Goal Update  Patient Details  Name: Sean Reyes. MRN: [id number] Date of Birth: [birth year] Referring Provider (PT): Gillis Santa, MD   Encounter Date: 04/08/2020   PT End of Session - 04/08/20 1523    Visit Number 19    Number of Visits 41    Date for PT Re-Evaluation 05/27/20    Authorization Type eval: 01/01/20    PT Start Time 1430    PT Stop Time 1515    PT Time Calculation (min) 45 min    Activity Tolerance Patient tolerated treatment well    Behavior During Therapy [facility] for tasks assessed/performed           Past Medical History:  Diagnosis Date  . Anemia   . Anxiety   . Arthritis   . Atrial fibrillation ([place])   . Cataracts, bilateral   . Complication of anesthesia    pt reports low BP's after surgery at [facility] and difficulty awakening  . Depression   . Diabetes ([place])    dx 6-8 yrs ago  . Dysrhythmia    a-fib  . GERD (gastroesophageal reflux disease)    OCC TAKES ALKA SELTZER  . History of kidney stones    10-15 yrs ago  . 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 Hyperlipidemia   . Hypertension   . Nocturia   . S/P ablation of atrial fibrillation    Ablative therapy  . Sleep apnea    CPAP   . Tachycardia, unspecified     Past Surgical History:  Procedure Laterality Date  . ABLATION    . ANTERIOR LAT LUMBAR FUSION N/A 06/27/2017   Procedure: Anterior Lateral Lumbar Interbody  Fusion - Lumbar Two-Lumbar Three - Lumbar Three-Lumbar Four, Posterior Lumbar Interbody Fusion Lumbar Four- Five;  Surgeon: Kary Kos, MD;  Location: [facility];  Service: Neurosurgery;  Laterality: N/A;  Anterior Lateral Lumbar Interbody  Fusion - Lumbar Two-Lumbar Three - Lumbar Three-Lumbar Four, Posterior Lumbar Interbody Fusion Lumbar Four- Five  . BACK SURGERY    . CARDIOVERSION N/A  08/29/2018   Procedure: CARDIOVERSION;  Surgeon: Corey Skains, MD;  Location: [facility];  Service: Cardiovascular;  Laterality: N/A;  . CARDIOVERSION N/A 09/24/2018   Procedure: CARDIOVERSION;  Surgeon: Corey Skains, MD;  Location: [facility];  Service: Cardiovascular;  Laterality: N/A;  . COLONOSCOPY WITH PROPOFOL N/A 10/05/2015   Procedure: COLONOSCOPY WITH PROPOFOL;  Surgeon: Lollie Sails, MD;  Location: [facility] ENDOSCOPY;  Service: Endoscopy;  Laterality: N/A;  . ESOPHAGOGASTRODUODENOSCOPY (EGD) WITH PROPOFOL N/A 04/01/2018   Procedure: ESOPHAGOGASTRODUODENOSCOPY (EGD) WITH PROPOFOL;  Surgeon: Lollie Sails, MD;  Location: [facility] ENDOSCOPY;  Service: Endoscopy;  Laterality: N/A;  . HERNIA REPAIR    . JOINT REPLACEMENT Bilateral    hips  RT+  LEFT X2   . LUMBAR LAMINECTOMY/DECOMPRESSION MICRODISCECTOMY Left 09/13/2016   Procedure: Microdiscectomy - Lumbar two-three,  Lumbar three- - left;  Surgeon: Kary Kos, MD;  Location: [facility];  Service: Neurosurgery;  Laterality: Left;  . SPINAL CORD STIMULATOR INSERTION  07/08/2019  . TONSILLECTOMY      Vitals:   04/08/20 1442  BP: (!) 144/63  Pulse: (!) 55  SpO2: 100%     Subjective Assessment - 04/08/20 1434    Subjective Pt reports that his neck is feeling a little better but still  limited.  His low back is not in pain upon arrival.    Pertinent History Pt is a 80 y.o. male referred to PT for LBP. Pt has extensive PMH of failed back surgical syndrome, postlaminectomy syndrome, chronic radicular pain, hx of lumbar fusion spine, spinal cord stimulator, paroxysmal A-fib, DM-Type 2, HTN, chronic kidney disease. Pt reports 4 fusions on lumbar spine due to protruding disks which was ~2 years ago. Pt reports that his spine stimulator has started to help some but his biggest issue is his decreased strength in his spine, legs, and core. In sitting pt has 0/10 NPS. With household distance pt has 4-5/10 NPS but is instantly relieved with sitting.  With long walks, from the clinic to the car (100 yds)  8-9/10 NPS. The pain is described as dull pain. Pt denies radicular symptoms. [name] LBP is located acros his belt line right above his buttocks. Pt reports difficulty with his balance and asc/desc stairs but at home he uses no AD at home. Pt does report a fall 1.5 months ago with his cane and fell forward onto his head where he reports he could not catch himself going forward where he hit his head. This is his only fall with no injury. [name] big goal with PT is to be able to indep replace your deck.    How long can you sit comfortably? unlimited    How long can you stand comfortably? 4-5 min    How long can you walk comfortably? 4-5 min    Patient Stated Goals Indep rebuild deck.    Currently in Pain? No/denies    Pain Onset --           TREATMENT   Ther-ex All seated exercises with manual resistance from SPT  Marches alternating BLE, 2x20 Hip abduction, BLE, 2x12 Hip adduction, BLE, x24 Knee flexion, BLE, 2x12 Knee extension, BLE, 2x12  Seated pball roll outs lateral R and L, 2x30s hold each side  Seated, sustained pallof press with rhythmic stabilization 3x 45 seconds   SciFit  x 5 minutes, L4.5  for cool down (5 minutes unbilled)   Goal update: 5TSTS for goal update 2MWT for goal update     Pt educated throughout session about proper posture and technique with exercises. Improved exercise technique, movement at target joints, use of target muscles after min to mod verbal, visual, tactile cues.      Patient demonstrates good motivation during session today.  Updated goals today including FOTO, 5TSTS  and 2MWT.  His FOTO was 44, 5TSTS 13.73 seconds, and 2 MWT 307'.  Pt made improvements in both the 5TSTS and 2 MWT demonstrating progression in strength and stamina.  Pt progressed strength exercises to seated and continued to do stretches for low back, which were tolerated well.  Pt denied any pain at the end of session.    Overall patient reports significant improvement in his lower extremity strength and endurance.  Patient encouraged to continue HEP and follow-up as scheduled.  He has yet to achieve maximal benefit from PT services.  Patient will continue benefit from skilled PT services to addressdeficits in strength and pain in order to return to full function at home.                                PT Long Term Goals - 04/08/20 1436      PT LONG TERM GOAL #1   Title  Pt will improve FOTO score to target goal (49) to demonstrate clinically significant improvement in functional mobility.    Baseline 01/06/20: 38; 01/29/20: 45; 04/08/20: 44    Time 8    Period Weeks    Status Partially Met    Target Date 05/27/20      PT LONG TERM GOAL #2   Title Pt will improve 5xSTS score by at least 5 sec with no UE support to demonstrate clinically significant improvement in BLE strength.    Baseline 11/18: 27 sec with BUE suppport, 01/29/20: 15.9s, 04/08/20: 13.73 sec    Time 8    Period Weeks    Status Achieved      PT LONG TERM GOAL #3   Title Pt will improve 2 MWT with no AD by 40' to demonstrate clinically significant improvement in endurance to improve household and community ambulation.    Baseline 11/18: 175' no AD; 01/29/20: Deferred due to elevated resting BP;  04/08/20: 307' no AD    Time 8    Period Weeks    Status Achieved      PT LONG TERM GOAL #4   Title Pt will ambulate with LRAD with normalized gait pattern > 5 min to reduce tripping hazard with shuffling gait.    Baseline 11/18: Pt amb with single LSC and shuffling gait after 2 min due to fatigue,    Time 8    Period Weeks    Status Deferred    Target Date 05/27/20                 Plan - 04/08/20 1523    Clinical Impression Statement Patient demonstrates good motivation during session today.  Updated goals today including FOTO, 5TSTS  and 2MWT.  His FOTO was 44, 5TSTS 13.73 seconds, and 2 MWT 307'.  Pt made  improvements in both the 5TSTS and 2 MWT demonstrating progression in strength and stamina.  Pt progressed strength exercises to seated and continued to do stretches for low back, which were tolerated well.  Pt denied any pain at the end of session.   Overall patient reports significant improvement in his lower extremity strength and endurance.  Patient encouraged to continue HEP and follow-up as scheduled.  He has yet to achieve maximal benefit from PT services.  Patient will continue benefit from skilled PT services to address deficits in strength and pain in order to return to full function at home.    Personal Factors and Comorbidities Age;Comorbidity 3+;Education;Past/Current Experience;Time since onset of injury/illness/exacerbation    Comorbidities spinal cord stimulator, paroxysmal A-fib, DM-Type 2, HTN, chronic kidney disease.    Examination-Activity Limitations Bed Mobility;Stand;Locomotion Level;Toileting;Bend;Reach Overhead;Carry;Squat;Hygiene/Grooming;Stairs    Examination-Participation Restrictions Occupation;Community Activity;Yard Work    Stability/Clinical Decision Making Unstable/Unpredictable    Rehab Potential Fair    PT Frequency 2x / week    PT Duration 8 weeks    PT Treatment/Interventions ADLs/Self Care Home Management;Moist Heat;Gait training;Stair training;Therapeutic activities;Therapeutic exercise;Balance training;Neuromuscular re-education;Patient/family education;Manual techniques;Functional mobility training;Cryotherapy    PT Next Visit Plan Progress HEP when appropriate, Improve LE strength and endurance.    PT Home Exercise Plan spinal cord stimulator, paroxysmal A-fib, DM-Type 2, HTN, chronic kidney disease.    Consulted and Agree with Plan of Care Patient           Patient will benefit from skilled therapeutic intervention in order to improve the following deficits and impairments:  Abnormal gait,Pain,Improper body mechanics,Decreased mobility,Postural  dysfunction,Decreased activity tolerance,Decreased endurance,Decreased range of motion,Decreased strength,Decreased balance,Difficulty  walking,Impaired flexibility  Visit Diagnosis: Chronic bilateral low back pain without sciatica  Muscle weakness (generalized)  Other abnormalities of gait and mobility     Problem List Patient Active Problem List   Diagnosis Date Noted  . Cervical facet joint syndrome 04/08/2020  . Cervicalgia 04/08/2020  . Spinal cord stimulator status 12/11/2019  . History of 2019 novel coronavirus disease (COVID-19) 10/02/2019  . CKD (chronic kidney disease) stage 3, GFR 30-59 ml/min (HCC) 01/19/2019  . Acquired thrombophilia ([place]) 01/19/2019  . 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 Postlaminectomy syndrome, lumbar region 01/16/2019  . History of fusion of lumbar spine (L2-L5) 01/16/2019  . Chronic radicular lumbar pain 01/16/2019  . HNP (herniated nucleus pulposus), lumbar 04/29/2018  . Advanced care planning/counseling discussion 09/28/2016  . Spinal stenosis, lumbar region, 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/13/2016  . Hyperlipidemia associated with type 2 diabetes mellitus ([place]) 07/14/2015  . Anemia 06/30/2015  . 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 OSA (obstructive sleep apnea) 03/23/2015  . Type 2 diabetes mellitus with diabetic chronic kidney disease ([place]) 01/05/2015  . Hypertension associated with diabetes ([place]) 09/28/2014  . 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[place]) 09/28/2014  . H/O prior ablation treatment 10/19/2011  . Paroxysmal atrial fibrillation ([place]) 10/19/2011    This entire session was performed under direct supervision and direction of a licensed therapist/therapist assistant . I have personally read, edited and approve of the note as written.   Rosalee Kaufman, SPT  Phillips Grout PT, DPT, GCS  Huprich,Jason 04/09/2020, 10:47 AM   [facility] [address] [place], [place], [address] Phone: [phone]   Fax:  [phone]  Name: [name]. MRN: [id number] Date of Birth: 10-02-1940

## 2020-04-08 NOTE — Progress Notes (Signed)
Safety precautions to be maintained throughout the outpatient stay will include: orient to surroundings, keep bed in low position, maintain call bell within reach at all times, provide assistance with transfer out of bed and ambulation.  

## 2020-04-13 ENCOUNTER — Ambulatory Visit: Payer: Medicare Other | Attending: Student in an Organized Health Care Education/Training Program

## 2020-04-13 DIAGNOSIS — M545 Low back pain, unspecified: Secondary | ICD-10-CM | POA: Insufficient documentation

## 2020-04-13 DIAGNOSIS — G8929 Other chronic pain: Secondary | ICD-10-CM | POA: Insufficient documentation

## 2020-04-13 DIAGNOSIS — M6281 Muscle weakness (generalized): Secondary | ICD-10-CM | POA: Insufficient documentation

## 2020-04-16 ENCOUNTER — Ambulatory Visit
Admission: RE | Admit: 2020-04-16 | Discharge: 2020-04-16 | Disposition: A | Discharge status: Home / Self Care | Payer: Medicare Other | Source: Ambulatory Visit | Attending: Student in an Organized Health Care Education/Training Program | Admitting: Student in an Organized Health Care Education/Training Program

## 2020-04-16 ENCOUNTER — Ambulatory Visit
Admission: RE | Admit: 2020-04-16 | Discharge: 2020-04-16 | Disposition: A | Discharge status: Home / Self Care | Payer: Medicare Other | Attending: Student in an Organized Health Care Education/Training Program | Admitting: Student in an Organized Health Care Education/Training Program

## 2020-04-16 DIAGNOSIS — M47812 Spondylosis without myelopathy or radiculopathy, cervical region: Secondary | ICD-10-CM | POA: Diagnosis not present

## 2020-04-16 DIAGNOSIS — M542 Cervicalgia: Secondary | ICD-10-CM | POA: Diagnosis not present

## 2020-04-19 DIAGNOSIS — M40202 Unspecified kyphosis, cervical region: Secondary | ICD-10-CM | POA: Diagnosis not present

## 2020-04-19 DIAGNOSIS — M47812 Spondylosis without myelopathy or radiculopathy, cervical region: Secondary | ICD-10-CM | POA: Diagnosis not present

## 2020-04-23 IMAGING — RF DG C-ARM 61-120 MIN
1 series · 2 of 2 positions shown · non-contrast
Comparison: 11/13/2017.

CLINICAL DATA: 77-year-old male. Posterior lumbar fusion. Initial
encounter.

EXAM:
LUMBAR SPINE - 2-3 VIEW; DG C-ARM 61-120 MIN
Fluoroscopic time: 22 seconds.

[Series 1: run · 2 of 2 slices shown]
[im 1/2]
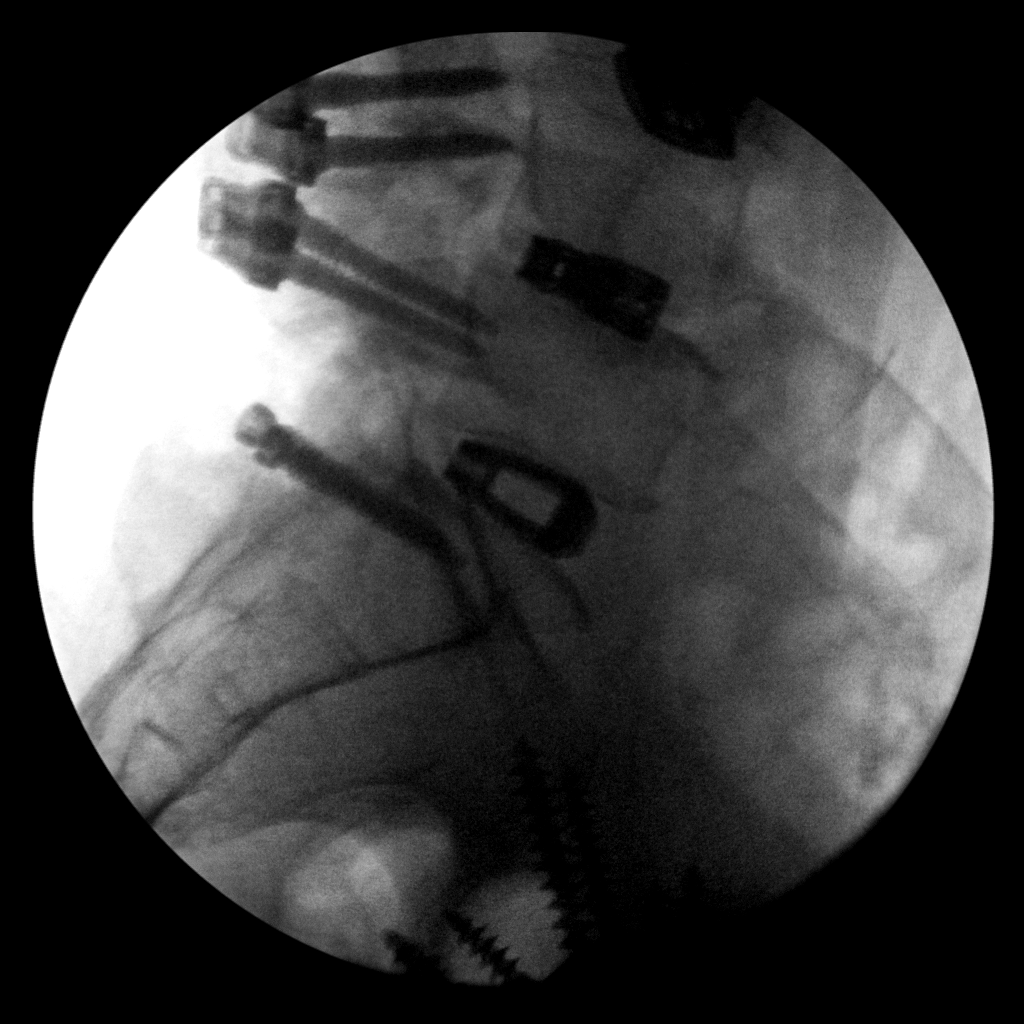
[im 2/2]
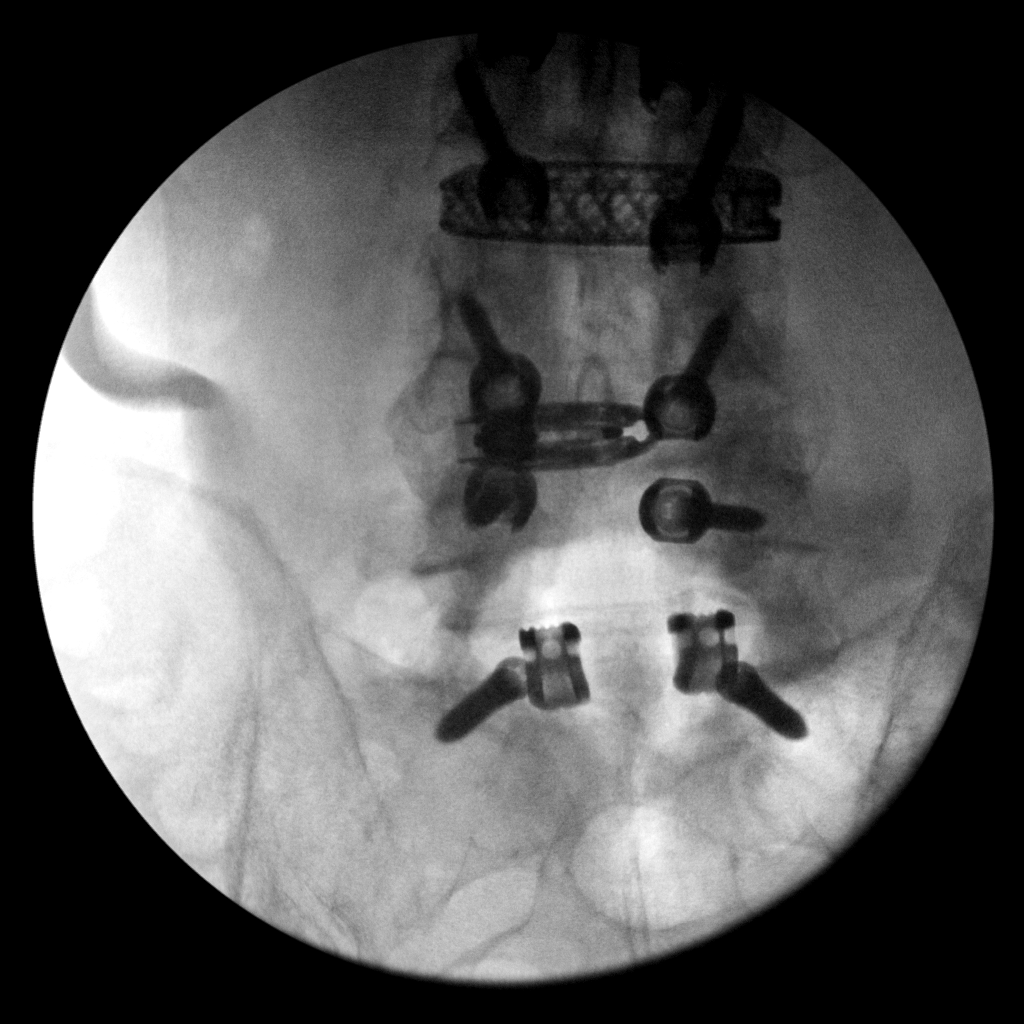

[2 of 2 positions shown; findings below may reference images not displayed]

FINDINGS: Two intraoperative C-arm views submitted for review after surgery.

Only portion of prior L2 through L5 fusion is noted. Posterior bar
removed. Placement of pedicle screws S1 level. Interbody spacer
placed L5-S1 level.
IMPRESSION: Extension of fusion to L5-S1 level. Complete fusion can be assessed
on follow-up.

## 2020-04-23 IMAGING — CR CHEST - 2 VIEW
2 series · 2 of 2 positions shown · non-contrast
Comparison: None.

CLINICAL DATA: Preop chest radiograph for back surgery

EXAM:
CHEST - 2 VIEW

[w chest pa]
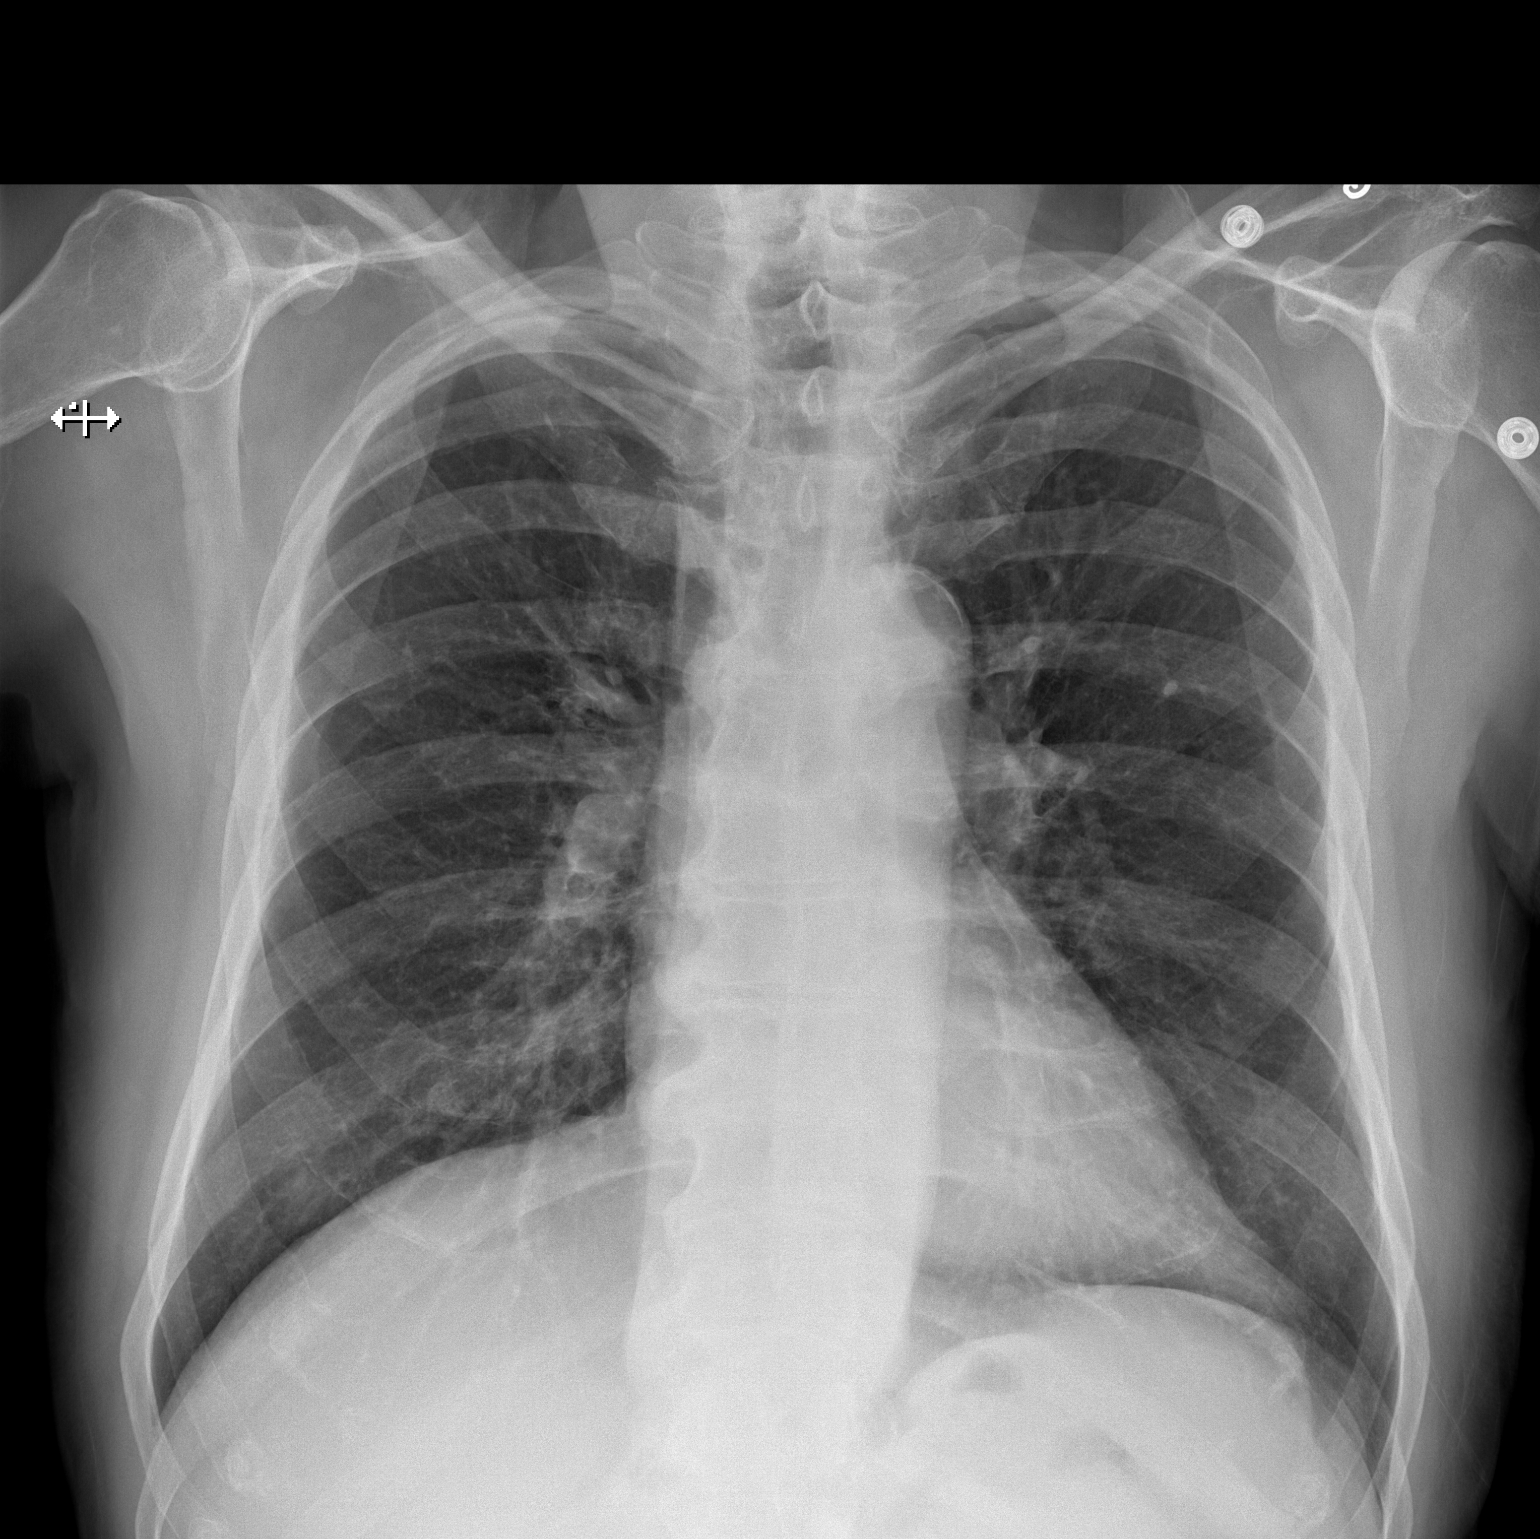

[w chest lat]
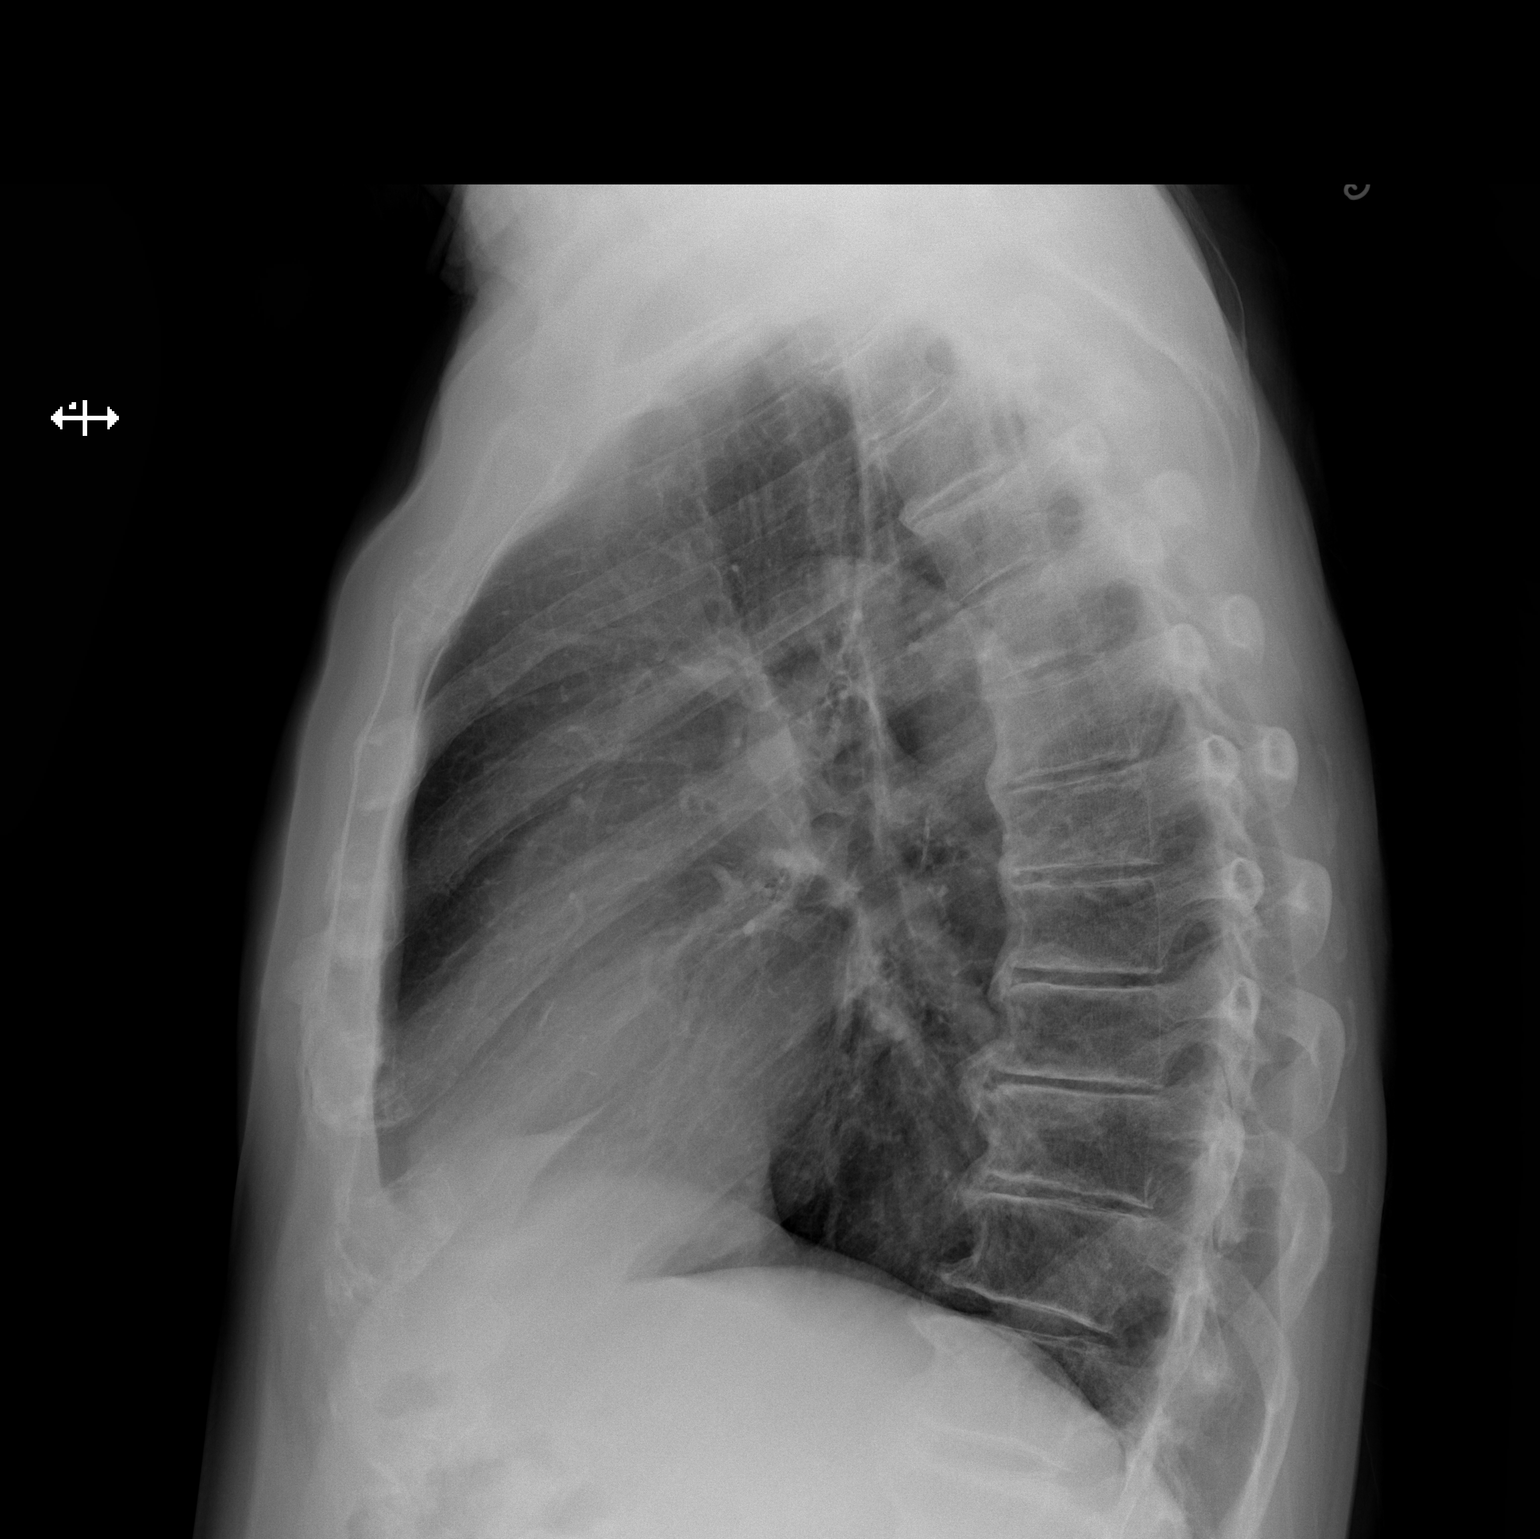

[2 of 2 positions shown; findings below may reference images not displayed]

FINDINGS: The heart size and mediastinal contours are within normal limits.
Both lungs are clear. The visualized skeletal structures are
unremarkable.
IMPRESSION: No active cardiopulmonary disease.

## 2020-04-26 ENCOUNTER — Ambulatory Visit: Payer: Medicare Other

## 2020-04-26 ENCOUNTER — Other Ambulatory Visit: Payer: Self-pay

## 2020-04-26 DIAGNOSIS — M6281 Muscle weakness (generalized): Secondary | ICD-10-CM

## 2020-04-26 DIAGNOSIS — M545 Low back pain, unspecified: Secondary | ICD-10-CM | POA: Diagnosis not present

## 2020-04-26 DIAGNOSIS — G8929 Other chronic pain: Secondary | ICD-10-CM

## 2020-04-26 NOTE — Therapy (Signed)
[facility] [address] [place], [place], [address] Phone: [phone]   Fax:  [phone]  Physical Therapy Progress Note   Dates of reporting period  02/17/20   to   04/26/20  Patient Details  Name: Sean Reyes. MRN: [id number] Date of Birth: Jun 15, 1940 Referring Provider (PT): Gillis Santa, MD   Encounter Date: 04/26/2020   PT End of Session - 04/26/20 1608    Visit Number 20    Number of Visits 41    Date for PT Re-Evaluation 05/27/20    Authorization Type eval: 01/01/20    PT Start Time 1600    PT Stop Time 1645    PT Time Calculation (min) 45 min    Activity Tolerance Patient tolerated treatment well    Behavior During Therapy [facility] for tasks assessed/performed           Past Medical History:  Diagnosis Date  . Anemia   . Anxiety   . Arthritis   . Atrial fibrillation ([place])   . Cataracts, bilateral   . Complication of anesthesia    pt reports low BP's after surgery at [facility] and difficulty awakening  . Depression   . Diabetes ([place])    dx 6-8 yrs ago  . Dysrhythmia    a-fib  . GERD (gastroesophageal reflux disease)    OCC TAKES ALKA SELTZER  . History of kidney stones    10-15 yrs ago  . 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 Hyperlipidemia   . Hypertension   . Nocturia   . S/P ablation of atrial fibrillation    Ablative therapy  . Sleep apnea    CPAP   . Tachycardia, unspecified     Past Surgical History:  Procedure Laterality Date  . ABLATION    . ANTERIOR LAT LUMBAR FUSION N/A 06/27/2017   Procedure: Anterior Lateral Lumbar Interbody  Fusion - Lumbar Two-Lumbar Three - Lumbar Three-Lumbar Four, Posterior Lumbar Interbody Fusion Lumbar Four- Five;  Surgeon: Kary Kos, MD;  Location: [place];  Service: Neurosurgery;  Laterality: N/A;  Anterior Lateral Lumbar Interbody  Fusion - Lumbar Two-Lumbar Three - Lumbar Three-Lumbar Four, Posterior Lumbar Interbody Fusion Lumbar Four- Five   . BACK SURGERY    . CARDIOVERSION N/A 08/29/2018   Procedure: CARDIOVERSION;  Surgeon: Corey Skains, MD;  Location: [facility];  Service: Cardiovascular;  Laterality: N/A;  . CARDIOVERSION N/A 09/24/2018   Procedure: CARDIOVERSION;  Surgeon: Corey Skains, MD;  Location: [facility];  Service: Cardiovascular;  Laterality: N/A;  . COLONOSCOPY WITH PROPOFOL N/A 10/05/2015   Procedure: COLONOSCOPY WITH PROPOFOL;  Surgeon: Lollie Sails, MD;  Location: [facility] ENDOSCOPY;  Service: Endoscopy;  Laterality: N/A;  . ESOPHAGOGASTRODUODENOSCOPY (EGD) WITH PROPOFOL N/A 04/01/2018   Procedure: ESOPHAGOGASTRODUODENOSCOPY (EGD) WITH PROPOFOL;  Surgeon: Lollie Sails, MD;  Location: [facility] ENDOSCOPY;  Service: Endoscopy;  Laterality: N/A;  . HERNIA REPAIR    . JOINT REPLACEMENT Bilateral    hips  RT+  LEFT X2   . LUMBAR LAMINECTOMY/DECOMPRESSION MICRODISCECTOMY Left 09/13/2016   Procedure: Microdiscectomy - Lumbar two-three,  Lumbar three- - left;  Surgeon: Kary Kos, MD;  Location: [place];  Service: Neurosurgery;  Laterality: Left;  . SPINAL CORD STIMULATOR INSERTION  07/08/2019  . TONSILLECTOMY      There were no vitals filed for this visit.   Subjective Assessment - 04/26/20 1604    Subjective Pt reports that he is doing alright today.  His back has been worse recently.  Patient denies any recent back trauma but states that his hamstrings have felt very weak.  No specific questions upon arrival    Pertinent History Pt is a 80 y.o. male referred to PT for LBP. Pt has extensive PMH of failed back surgical syndrome, postlaminectomy syndrome, chronic radicular pain, hx of lumbar fusion spine, spinal cord stimulator, paroxysmal A-fib, DM-Type 2, HTN, chronic kidney disease. Pt reports 4 fusions on lumbar spine due to protruding disks which was ~2 years ago. Pt reports that his spine stimulator has started to help some but his biggest issue is his decreased strength in his spine, legs, and core. In sitting pt  has 0/10 NPS. With household distance pt has 4-5/10 NPS but is instantly relieved with sitting. With long walks, from the clinic to the car (100 yds)  8-9/10 NPS. The pain is described as dull pain. Pt denies radicular symptoms. [name] LBP is located acros his belt line right above his buttocks. Pt reports difficulty with his balance and asc/desc stairs but at home he uses no AD at home. Pt does report a fall 1.5 months ago with his cane and fell forward onto his head where he reports he could not catch himself going forward where he hit his head. This is his only fall with no injury. [name] big goal with PT is to be able to indep replace your deck.    How long can you sit comfortably? unlimited    How long can you stand comfortably? 4-5 min    How long can you walk comfortably? 4-5 min    Patient Stated Goals Indep rebuild deck.    Currently in Pain? Yes    Pain Score 6     Pain Location Back    Pain Orientation Lower    Pain Descriptors / Indicators Aching    Pain Type Chronic pain    Pain Onset More than a month ago    Pain Frequency Intermittent                 TREATMENT   Ther-ex NuStep L0 x 5 minutes for warm-up during history, 3 minutes unbilled; Hooklying marches x 20 BLE; Hooklying lumbar rotation knee rocking x 1 minute; Hooklying lumbar rotation stretch 2 x 30s on each side; Hooklyingadductorsqueezewith manual resistance from therapistx 20BLE; Hooklying clams with manual resistance from therapist x 20 BLE; Hooklying bridgesx 20; SLR x 20 RLE, attempted on LLE but only able to perform 5 reps before stopping due to back pain; Hooklying isometric manually resisted trunk rotation 5s hold x 10 each direction; Bilateral distal and proximal hamstring stretches x30 seconds each; Seated pball roll outs forward 5s hold x 20; Seated isometric alternating hand to knee 5s hold x 10 on each side;   Pt educated throughout session about proper posture and technique with  exercises. Improved exercise technique, movement at target joints, use of target muscles after min to mod verbal, visual, tactile cues.    Patient demonstrates good motivation during session today.Updated goals during last session including FOTO, 5TSTS  and 2MWT.  His FOTO was 44, 5TSTS 13.73 seconds, and 2 MWT 307'.  Pt made improvements in both the 5TSTS and 2 MWT demonstrating progression in strength and stamina. At that time patient was reporting significant improvement in his lower extremity strength and endurance. However since that time pt has been unable to come to therapy for over 2 weeks and reports that his back pain and leg  strength have regressed. Pt is notably in more pain during session today. Focused today's session on exercises in hooklying as well as gentle range of motion. Patient encouraged to continue HEP and follow-up as scheduled. He has yet to achieve maximal benefit from PT services. Patient will continue benefit from skilled PT services to addressdeficits in strength and pain in order to return to full function at home.                              PT Long Term Goals - 04/26/20 1624      PT LONG TERM GOAL #1   Title Pt will improve FOTO score to target goal (49) to demonstrate clinically significant improvement in functional mobility.    Baseline 01/06/20: 38; 01/29/20: 45; 04/08/20: 44    Time 8    Period Weeks    Status Partially Met    Target Date 05/27/20      PT LONG TERM GOAL #2   Title Pt will improve 5xSTS score by at least 5 sec with no UE support to demonstrate clinically significant improvement in BLE strength.    Baseline 11/18: 27 sec with BUE suppport, 01/29/20: 15.9s, 04/08/20: 13.73 sec    Time 8    Period Weeks    Status Achieved      PT LONG TERM GOAL #3   Title Pt will improve 2 MWT with no AD by 40' to demonstrate clinically significant improvement in endurance to improve household and community ambulation.     Baseline 11/18: 175' no AD; 01/29/20: Deferred due to elevated resting BP;  04/08/20: 307' no AD    Time 8    Period Weeks    Status Achieved      PT LONG TERM GOAL #4   Title Pt will ambulate with LRAD with normalized gait pattern > 5 min to reduce tripping hazard with shuffling gait.    Baseline 11/18: Pt amb with single LSC and shuffling gait after 2 min due to fatigue,    Time 8    Period Weeks    Status Deferred    Target Date 05/27/20                 Plan - 04/26/20 1623    Clinical Impression Statement Patient demonstrates good motivation during session today.  Updated goals during last session including FOTO, 5TSTS  and 2MWT.  His FOTO was 44, 5TSTS 13.73 seconds, and 2 MWT 307'.  Pt made improvements in both the 5TSTS and 2 MWT demonstrating progression in strength and stamina. At that time patient was reporting significant improvement in his lower extremity strength and endurance. However since that time pt has been unable to come to therapy for over 2 weeks and reports that his back pain and leg strength have regressed. Pt is notably in more pain during session today. Focused today's session on exercises in hooklying as well as gentle range of motion. Patient encouraged to continue HEP and follow-up as scheduled.  He has yet to achieve maximal benefit from PT services. Patient will continue benefit from skilled PT services to address deficits in strength and pain in order to return to full function at home.    Personal Factors and Comorbidities Age;Comorbidity 3+;Education;Past/Current Experience;Time since onset of injury/illness/exacerbation    Comorbidities spinal cord stimulator, paroxysmal A-fib, DM-Type 2, HTN, chronic kidney disease.    Examination-Activity Limitations Bed Mobility;Stand;Locomotion Level;Toileting;Bend;Reach Overhead;Carry;Squat;Hygiene/Grooming;Stairs    Examination-Participation  Restrictions Occupation;Community Activity;Yard Work    Stability/Clinical  Decision Making Unstable/Unpredictable    Rehab Potential Fair    PT Frequency 2x / week    PT Duration 8 weeks    PT Treatment/Interventions ADLs/Self Care Home Management;Moist Heat;Gait training;Stair training;Therapeutic activities;Therapeutic exercise;Balance training;Neuromuscular re-education;Patient/family education;Manual techniques;Functional mobility training;Cryotherapy    PT Next Visit Plan Progress HEP when appropriate, Improve LE strength and endurance.    PT Home Exercise Plan spinal cord stimulator, paroxysmal A-fib, DM-Type 2, HTN, chronic kidney disease.    Consulted and Agree with Plan of Care Patient           Patient will benefit from skilled therapeutic intervention in order to improve the following deficits and impairments:  Abnormal gait,Pain,Improper body mechanics,Decreased mobility,Postural dysfunction,Decreased activity tolerance,Decreased endurance,Decreased range of motion,Decreased strength,Decreased balance,Difficulty walking,Impaired flexibility  Visit Diagnosis: Chronic bilateral low back pain without sciatica  Muscle weakness (generalized)     Problem List Patient Active Problem List   Diagnosis Date Noted  . Cervical facet joint syndrome 04/08/2020  . Cervicalgia 04/08/2020  . Spinal cord stimulator status 12/11/2019  . History of 2019 novel coronavirus disease (COVID-19) 10/02/2019  . CKD (chronic kidney disease) stage 3, GFR 30-59 ml/min (HCC) 01/19/2019  . Acquired thrombophilia ([place]) 01/19/2019  . 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 Postlaminectomy syndrome, lumbar region 01/16/2019  . History of fusion of lumbar spine (L2-L5) 01/16/2019  . Chronic radicular lumbar pain 01/16/2019  . HNP (herniated nucleus pulposus), lumbar 04/29/2018  . Advanced care planning/counseling discussion 09/28/2016  . Spinal stenosis, lumbar region, 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/13/2016  . Hyperlipidemia associated with type 2 diabetes mellitus ([place])  07/14/2015  . Anemia 06/30/2015  . 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 OSA (obstructive sleep apnea) 03/23/2015  . Type 2 diabetes mellitus with diabetic chronic kidney disease ([place]) 01/05/2015  . Hypertension associated with diabetes ([place]) 09/28/2014  . 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[place]) 09/28/2014  . H/O prior ablation treatment 10/19/2011  . Paroxysmal atrial fibrillation (HCC) 10/19/2011   Phillips Grout PT, DPT, GCS  Carlie Corpus 04/27/2020, 8:35 AM  [facility] [address] [place], [place], [address] Phone: [phone]   Fax:  [phone]  Name: Sean Reyes. MRN: [id number] Date of Birth: 16-May-1940

## 2020-04-28 ENCOUNTER — Ambulatory Visit: Payer: Medicare Other

## 2020-04-29 DIAGNOSIS — M79604 Pain in right leg: Secondary | ICD-10-CM | POA: Diagnosis not present

## 2020-04-29 DIAGNOSIS — G629 Polyneuropathy, unspecified: Secondary | ICD-10-CM | POA: Diagnosis not present

## 2020-04-29 DIAGNOSIS — R29898 Other symptoms and signs involving the musculoskeletal system: Secondary | ICD-10-CM | POA: Diagnosis not present

## 2020-05-03 ENCOUNTER — Other Ambulatory Visit: Payer: Self-pay | Admitting: Neurosurgery

## 2020-05-03 ENCOUNTER — Ambulatory Visit: Payer: Medicare Other

## 2020-05-03 ENCOUNTER — Other Ambulatory Visit: Payer: Self-pay

## 2020-05-03 VITALS — BP 122/66 | HR 50

## 2020-05-03 DIAGNOSIS — R29898 Other symptoms and signs involving the musculoskeletal system: Secondary | ICD-10-CM

## 2020-05-03 DIAGNOSIS — M545 Low back pain, unspecified: Secondary | ICD-10-CM | POA: Diagnosis not present

## 2020-05-03 DIAGNOSIS — M6281 Muscle weakness (generalized): Secondary | ICD-10-CM

## 2020-05-03 DIAGNOSIS — G629 Polyneuropathy, unspecified: Secondary | ICD-10-CM

## 2020-05-03 DIAGNOSIS — M79604 Pain in right leg: Secondary | ICD-10-CM

## 2020-05-03 DIAGNOSIS — G8929 Other chronic pain: Secondary | ICD-10-CM

## 2020-05-03 NOTE — Therapy (Signed)
[facility] [address] [place], [place], [address] Phone: [phone]   Fax:  [phone]  Physical Therapy Treatment  Patient Details  Name: Sean Reyes. MRN: [id number] Date of Birth: May 03, 1940 Referring Provider (PT): Gillis Santa, MD   Encounter Date: 05/03/2020   PT End of Session - 05/03/20 1609    Visit Number 21    Number of Visits 41    Date for PT Re-Evaluation 05/27/20    Authorization Type eval: 01/01/20    PT Start Time 1600    PT Stop Time 1645    PT Time Calculation (min) 45 min    Activity Tolerance Patient tolerated treatment well    Behavior During Therapy [facility] for tasks assessed/performed           Past Medical History:  Diagnosis Date  . Anemia   . Anxiety   . Arthritis   . Atrial fibrillation ([place])   . Cataracts, bilateral   . Complication of anesthesia    pt reports low BP's after surgery at [facility] and difficulty awakening  . Depression   . Diabetes ([place])    dx 6-8 yrs ago  . Dysrhythmia    a-fib  . GERD (gastroesophageal reflux disease)    OCC TAKES ALKA SELTZER  . History of kidney stones    10-15 yrs ago  . 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 Hyperlipidemia   . Hypertension   . Nocturia   . S/P ablation of atrial fibrillation    Ablative therapy  . Sleep apnea    CPAP   . Tachycardia, unspecified     Past Surgical History:  Procedure Laterality Date  . ABLATION    . ANTERIOR LAT LUMBAR FUSION N/A 06/27/2017   Procedure: Anterior Lateral Lumbar Interbody  Fusion - Lumbar Two-Lumbar Three - Lumbar Three-Lumbar Four, Posterior Lumbar Interbody Fusion Lumbar Four- Five;  Surgeon: Kary Kos, MD;  Location: [place];  Service: Neurosurgery;  Laterality: N/A;  Anterior Lateral Lumbar Interbody  Fusion - Lumbar Two-Lumbar Three - Lumbar Three-Lumbar Four, Posterior Lumbar Interbody Fusion Lumbar Four- Five  . BACK SURGERY    . CARDIOVERSION N/A 08/29/2018    Procedure: CARDIOVERSION;  Surgeon: Corey Skains, MD;  Location: [facility];  Service: Cardiovascular;  Laterality: N/A;  . CARDIOVERSION N/A 09/24/2018   Procedure: CARDIOVERSION;  Surgeon: Corey Skains, MD;  Location: [facility];  Service: Cardiovascular;  Laterality: N/A;  . COLONOSCOPY WITH PROPOFOL N/A 10/05/2015   Procedure: COLONOSCOPY WITH PROPOFOL;  Surgeon: Lollie Sails, MD;  Location: [facility] ENDOSCOPY;  Service: Endoscopy;  Laterality: N/A;  . ESOPHAGOGASTRODUODENOSCOPY (EGD) WITH PROPOFOL N/A 04/01/2018   Procedure: ESOPHAGOGASTRODUODENOSCOPY (EGD) WITH PROPOFOL;  Surgeon: Lollie Sails, MD;  Location: [facility] ENDOSCOPY;  Service: Endoscopy;  Laterality: N/A;  . HERNIA REPAIR    . JOINT REPLACEMENT Bilateral    hips  RT+  LEFT X2   . LUMBAR LAMINECTOMY/DECOMPRESSION MICRODISCECTOMY Left 09/13/2016   Procedure: Microdiscectomy - Lumbar two-three,  Lumbar three- - left;  Surgeon: Kary Kos, MD;  Location: [place];  Service: Neurosurgery;  Laterality: Left;  . SPINAL CORD STIMULATOR INSERTION  07/08/2019  . TONSILLECTOMY      Vitals:   05/03/20 1621  BP: 122/66  Pulse: (!) 50  SpO2: 100%     Subjective Assessment - 05/03/20 1606    Subjective Pt reports that he is doing alright today. His back and hamstrings are feeling "  weak today" and he rates the pain as 4/10 today.  Patient states that he saw Dr. Cari Caraway who wanted to order a lumbar MRI but he has not heard back from scheduling.   No specific questions upon arrival    Pertinent History Pt is a 80 y.o. male referred to PT for LBP. Pt has extensive PMH of failed back surgical syndrome, postlaminectomy syndrome, chronic radicular pain, hx of lumbar fusion spine, spinal cord stimulator, paroxysmal A-fib, DM-Type 2, HTN, chronic kidney disease. Pt reports 4 fusions on lumbar spine due to protruding disks which was ~2 years ago. Pt reports that his spine stimulator has started to help some but his biggest issue is his  decreased strength in his spine, legs, and core. In sitting pt has 0/10 NPS. With household distance pt has 4-5/10 NPS but is instantly relieved with sitting. With long walks, from the clinic to the car (100 yds)  8-9/10 NPS. The pain is described as dull pain. Pt denies radicular symptoms. [name] LBP is located acros his belt line right above his buttocks. Pt reports difficulty with his balance and asc/desc stairs but at home he uses no AD at home. Pt does report a fall 1.5 months ago with his cane and fell forward onto his head where he reports he could not catch himself going forward where he hit his head. This is his only fall with no injury. [name] big goal with PT is to be able to indep replace your deck.    How long can you sit comfortably? unlimited    How long can you stand comfortably? 4-5 min    How long can you walk comfortably? 4-5 min    Patient Stated Goals Indep rebuild deck.    Currently in Pain? Yes    Pain Score 4     Pain Location Back    Pain Orientation Lower    Pain Descriptors / Indicators Aching    Pain Type Chronic pain    Pain Onset More than a month ago    Pain Frequency Intermittent              TREATMENT   Ther-ex NuStep L0-1 x 5 minutes for warm-up during history, 3 minutes unbilled; Seated marches with 3# ankle weights (AW) 2 x 20 BLE; Seated adductor ballsqueeze2 x 20BLE; Seated clams with green tband 2 x 20 BLE; Seated lumbar rotation stretch x 45 seconds each direction; Seated pball forward roll-outs 5s hold x 10; Seated HS curls with green tband x 10 BLE; Seated isometric alternating hand to knee 5s hold x 15 on each side; Seated HS step stretch x 45s BLE;   Pt educated throughout session about proper posture and technique with exercises. Improved exercise technique, movement at target joints, use of target muscles after min to mod verbal, visual, tactile cues.    Focused today's session on exercises in sitting as patient reports  significant increase in his symptoms during standing activities.   Patient is able to complete all exercises instructed however requires intermittent seated rest breaks due to fatigue.  He reports increased weakness in left lower extremity with hip flexion marching.  He has had somewhat of a regression over the last few weeks compared to his excellent progress previously.  Patient encouraged to continue HEP and follow-up as scheduled. He has yet to achieve maximal benefit from PT services. Patient will continue benefit from skilled PT services to addressdeficits in strength and pain in order to return to full function  at home.                             PT Long Term Goals - 04/26/20 1624      PT LONG TERM GOAL #1   Title Pt will improve FOTO score to target goal (49) to demonstrate clinically significant improvement in functional mobility.    Baseline 01/06/20: 38; 01/29/20: 45; 04/08/20: 44    Time 8    Period Weeks    Status Partially Met    Target Date 05/27/20      PT LONG TERM GOAL #2   Title Pt will improve 5xSTS score by at least 5 sec with no UE support to demonstrate clinically significant improvement in BLE strength.    Baseline 11/18: 27 sec with BUE suppport, 01/29/20: 15.9s, 04/08/20: 13.73 sec    Time 8    Period Weeks    Status Achieved      PT LONG TERM GOAL #3   Title Pt will improve 2 MWT with no AD by 40' to demonstrate clinically significant improvement in endurance to improve household and community ambulation.    Baseline 11/18: 175' no AD; 01/29/20: Deferred due to elevated resting BP;  04/08/20: 307' no AD    Time 8    Period Weeks    Status Achieved      PT LONG TERM GOAL #4   Title Pt will ambulate with LRAD with normalized gait pattern > 5 min to reduce tripping hazard with shuffling gait.    Baseline 11/18: Pt amb with single LSC and shuffling gait after 2 min due to fatigue,    Time 8    Period Weeks    Status Deferred    Target  Date 05/27/20                 Plan - 05/03/20 1609    Clinical Impression Statement Focused today's session on exercises in sitting as patient reports significant increase in his symptoms during standing activities.   Patient is able to complete all exercises instructed however requires intermittent seated rest breaks due to fatigue.  He reports increased weakness in left lower extremity with hip flexion marching.  He has had somewhat of a regression over the last few weeks compared to his excellent progress previously.  Patient encouraged to continue HEP and follow-up as scheduled.  He has yet to achieve maximal benefit from PT services. Patient will continue benefit from skilled PT services to address deficits in strength and pain in order to return to full function at home.    Personal Factors and Comorbidities Age;Comorbidity 3+;Education;Past/Current Experience;Time since onset of injury/illness/exacerbation    Comorbidities spinal cord stimulator, paroxysmal A-fib, DM-Type 2, HTN, chronic kidney disease.    Examination-Activity Limitations Bed Mobility;Stand;Locomotion Level;Toileting;Bend;Reach Overhead;Carry;Squat;Hygiene/Grooming;Stairs    Examination-Participation Restrictions Occupation;Community Activity;Yard Work    Stability/Clinical Decision Making Unstable/Unpredictable    Rehab Potential Fair    PT Frequency 2x / week    PT Duration 8 weeks    PT Treatment/Interventions ADLs/Self Care Home Management;Moist Heat;Gait training;Stair training;Therapeutic activities;Therapeutic exercise;Balance training;Neuromuscular re-education;Patient/family education;Manual techniques;Functional mobility training;Cryotherapy    PT Next Visit Plan Progress HEP when appropriate, Improve LE strength and endurance.    PT Home Exercise Plan spinal cord stimulator, paroxysmal A-fib, DM-Type 2, HTN, chronic kidney disease.    Consulted and Agree with Plan of Care Patient           Patient will  benefit from skilled therapeutic intervention in order to improve the following deficits and impairments:  Abnormal gait,Pain,Improper body mechanics,Decreased mobility,Postural dysfunction,Decreased activity tolerance,Decreased endurance,Decreased range of motion,Decreased strength,Decreased balance,Difficulty walking,Impaired flexibility  Visit Diagnosis: Chronic bilateral low back pain without sciatica  Muscle weakness (generalized)     Problem List Patient Active Problem List   Diagnosis Date Noted  . Cervical facet joint syndrome 04/08/2020  . Cervicalgia 04/08/2020  . Spinal cord stimulator status 12/11/2019  . History of 2019 novel coronavirus disease (COVID-19) 10/02/2019  . CKD (chronic kidney disease) stage 3, GFR 30-59 ml/min (HCC) 01/19/2019  . Acquired thrombophilia ([place]) 01/19/2019  . 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 Postlaminectomy syndrome, lumbar region 01/16/2019  . History of fusion of lumbar spine (L2-L5) 01/16/2019  . Chronic radicular lumbar pain 01/16/2019  . HNP (herniated nucleus pulposus), lumbar 04/29/2018  . Advanced care planning/counseling discussion 09/28/2016  . Spinal stenosis, lumbar region, 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/13/2016  . Hyperlipidemia associated with type 2 diabetes mellitus ([place]) 07/14/2015  . Anemia 06/30/2015  . 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 OSA (obstructive sleep apnea) 03/23/2015  . Type 2 diabetes mellitus with diabetic chronic kidney disease ([place]) 01/05/2015  . Hypertension associated with diabetes ([place]) 09/28/2014  . 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[place]) 09/28/2014  . H/O prior ablation treatment 10/19/2011  . Paroxysmal atrial fibrillation (HCC) 10/19/2011    Phillips Grout PT, DPT, GCS  Hardy Harcum 05/03/2020, 5:02 PM  [facility] [address] [place], [place], [address] Phone: [phone]   Fax:   [phone]  Name: [name]. MRN: [id number] Date of Birth: [birth year]

## 2020-05-04 ENCOUNTER — Other Ambulatory Visit: Payer: Self-pay | Admitting: Nurse Practitioner

## 2020-05-05 ENCOUNTER — Ambulatory Visit: Payer: Medicare Other

## 2020-05-06 DIAGNOSIS — I10 Essential (primary) hypertension: Secondary | ICD-10-CM | POA: Diagnosis not present

## 2020-05-06 DIAGNOSIS — E782 Mixed hyperlipidemia: Secondary | ICD-10-CM | POA: Diagnosis not present

## 2020-05-06 DIAGNOSIS — N1831 Chronic kidney disease, stage 3a: Secondary | ICD-10-CM | POA: Diagnosis not present

## 2020-05-06 DIAGNOSIS — I480 Paroxysmal atrial fibrillation: Secondary | ICD-10-CM | POA: Diagnosis not present

## 2020-05-07 NOTE — Patient Instructions (Incomplete)
TREATMENT   Ther-ex NuStep L0-1x 5 minutes for warm-up during history, 65minutes unbilled; Seated marches with 3# ankle weights (AW) 2 x 20 BLE; Seated adductor ballsqueeze2 x 20BLE; Seated clams with green tband 2 x 20 BLE; Seated lumbar rotation stretch x 45 seconds each direction; Seated pball forward roll-outs 5s hold x 10; Seated HS curls with green tband x 10 BLE; Seated isometric alternating hand to knee 5s hold x 15 on each side; Seated HS step stretch x 45s BLE;   Pt educated throughout session about proper posture and technique with exercises. Improved exercise technique, movement at target joints, use of target muscles after min to mod verbal, visual, tactile cues.    Focused today's session on exercises in sitting as patient reports significant increase in his symptoms during standing activities.  Patient is able to complete all exercises instructed however requires intermittent seated rest breaks due to fatigue.  He reports increased weakness in left lower extremity with hip flexion marching.  He has had somewhat of a regression over the last few weeks compared to his excellent progress previously.  Patient encouraged to continue HEP and follow-up as scheduled. He has yet to achieve maximal benefit from PT services. Patient will continue benefit from skilled PT services to addressdeficits in strength and pain in order to return to full function at home.

## 2020-05-10 ENCOUNTER — Ambulatory Visit: Payer: Medicare Other

## 2020-05-10 DIAGNOSIS — G8929 Other chronic pain: Secondary | ICD-10-CM

## 2020-05-10 DIAGNOSIS — M6281 Muscle weakness (generalized): Secondary | ICD-10-CM

## 2020-05-12 ENCOUNTER — Ambulatory Visit: Payer: Medicare Other

## 2020-05-12 ENCOUNTER — Other Ambulatory Visit: Payer: Self-pay

## 2020-05-12 DIAGNOSIS — M545 Low back pain, unspecified: Secondary | ICD-10-CM | POA: Diagnosis not present

## 2020-05-12 DIAGNOSIS — G8929 Other chronic pain: Secondary | ICD-10-CM

## 2020-05-12 DIAGNOSIS — M6281 Muscle weakness (generalized): Secondary | ICD-10-CM

## 2020-05-12 NOTE — Therapy (Signed)
[facility] [address]. [place], [place], [address] Phone: [phone]   Fax:  [phone]  Physical Therapy Treatment  Patient Details  Name: Sean Reyes. MRN: [id number] Date of Birth: 05-May-1940 Referring Provider (PT): Gillis Santa, MD   Encounter Date: 05/12/2020   PT End of Session - 05/13/20 1203    Visit Number 22    Number of Visits 41    Date for PT Re-Evaluation 05/27/20    Authorization Type eval: 01/01/20    PT Start Time 1600    PT Stop Time 1645    PT Time Calculation (min) 45 min    Activity Tolerance Patient tolerated treatment well    Behavior During Therapy [facility] for tasks assessed/performed           Past Medical History:  Diagnosis Date  . Anemia   . Anxiety   . Arthritis   . Atrial fibrillation ([place])   . Cataracts, bilateral   . Complication of anesthesia    pt reports low BP's after surgery at [facility] and difficulty awakening  . Depression   . Diabetes ([place])    dx 6-8 yrs ago  . Dysrhythmia    a-fib  . GERD (gastroesophageal reflux disease)    OCC TAKES ALKA SELTZER  . History of kidney stones    10-15 yrs ago  . 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 Hyperlipidemia   . Hypertension   . Nocturia   . S/P ablation of atrial fibrillation    Ablative therapy  . Sleep apnea    CPAP   . Tachycardia, unspecified     Past Surgical History:  Procedure Laterality Date  . ABLATION    . ANTERIOR LAT LUMBAR FUSION N/A 06/27/2017   Procedure: Anterior Lateral Lumbar Interbody  Fusion - Lumbar Two-Lumbar Three - Lumbar Three-Lumbar Four, Posterior Lumbar Interbody Fusion Lumbar Four- Five;  Surgeon: Kary Kos, MD;  Location: [place];  Service: Neurosurgery;  Laterality: N/A;  Anterior Lateral Lumbar Interbody  Fusion - Lumbar Two-Lumbar Three - Lumbar Three-Lumbar Four, Posterior Lumbar Interbody Fusion Lumbar Four- Five  . BACK SURGERY    . CARDIOVERSION N/A 08/29/2018    Procedure: CARDIOVERSION;  Surgeon: Corey Skains, MD;  Location: [facility];  Service: Cardiovascular;  Laterality: N/A;  . CARDIOVERSION N/A 09/24/2018   Procedure: CARDIOVERSION;  Surgeon: Corey Skains, MD;  Location: [facility];  Service: Cardiovascular;  Laterality: N/A;  . COLONOSCOPY WITH PROPOFOL N/A 10/05/2015   Procedure: COLONOSCOPY WITH PROPOFOL;  Surgeon: Lollie Sails, MD;  Location: [facility] ENDOSCOPY;  Service: Endoscopy;  Laterality: N/A;  . ESOPHAGOGASTRODUODENOSCOPY (EGD) WITH PROPOFOL N/A 04/01/2018   Procedure: ESOPHAGOGASTRODUODENOSCOPY (EGD) WITH PROPOFOL;  Surgeon: Lollie Sails, MD;  Location: [facility] ENDOSCOPY;  Service: Endoscopy;  Laterality: N/A;  . HERNIA REPAIR    . JOINT REPLACEMENT Bilateral    hips  RT+  LEFT X2   . LUMBAR LAMINECTOMY/DECOMPRESSION MICRODISCECTOMY Left 09/13/2016   Procedure: Microdiscectomy - Lumbar two-three,  Lumbar three- - left;  Surgeon: Kary Kos, MD;  Location: [place];  Service: Neurosurgery;  Laterality: Left;  . SPINAL CORD STIMULATOR INSERTION  07/08/2019  . TONSILLECTOMY      There were no vitals filed for this visit.   Subjective Assessment - 05/13/20 1201    Subjective Pt reports that he is having a lot of stiffness in his back today.  He continues with weakness in his back and  hamstrings.  He has scheduled his lumbar MRI and it is upcoming. No specific questions upon arrival    Pertinent History Pt is a 80 y.o. male referred to PT for LBP. Pt has extensive PMH of failed back surgical syndrome, postlaminectomy syndrome, chronic radicular pain, hx of lumbar fusion spine, spinal cord stimulator, paroxysmal A-fib, DM-Type 2, HTN, chronic kidney disease. Pt reports 4 fusions on lumbar spine due to protruding disks which was ~2 years ago. Pt reports that his spine stimulator has started to help some but his biggest issue is his decreased strength in his spine, legs, and core. In sitting pt has 0/10 NPS. With household distance pt has  4-5/10 NPS but is instantly relieved with sitting. With long walks, from the clinic to the car (100 yds)  8-9/10 NPS. The pain is described as dull pain. Pt denies radicular symptoms. [name] LBP is located acros his belt line right above his buttocks. Pt reports difficulty with his balance and asc/desc stairs but at home he uses no AD at home. Pt does report a fall 1.5 months ago with his cane and fell forward onto his head where he reports he could not catch himself going forward where he hit his head. This is his only fall with no injury. [name] big goal with PT is to be able to indep replace your deck.    How long can you sit comfortably? unlimited    How long can you stand comfortably? 4-5 min    How long can you walk comfortably? 4-5 min    Patient Stated Goals Indep rebuild deck.    Currently in Pain? Yes    Pain Score --   Does not rate today   Pain Location Back    Pain Orientation Lower    Pain Descriptors / Indicators Aching    Pain Type Chronic pain    Pain Onset More than a month ago    Pain Frequency Intermittent             TREATMENT   Ther-ex NuStep L0-2 x 5 minutes for warm-up during history, 3 minutes unbilled; Hooklying marchesx 20 BLE; Hooklying lumbar rotation knee rocking x 1 minute; Hooklyingadductorsqueezewith manual resistance from therapistx 20BLE; Hooklying clams with manual resistance from therapist x 20 BLE; Hooklying bridgesx 20; SLR x 20 RLE, attempted on LLE but only able to perform 10 reps before stopping due to back pain; Hooklying isometric manually resisted trunk rotation 5s hold x 10 each direction; Bilateral distal and proximal hamstring stretches x30 seconds each; Single knee to chest stretch x 45s BLE; Figure 4 stretch x 45s BLE; HS stretches proximal and distal x 45s each BLE;  Manual Therapy  Thera-roller to L proximal anterior thigh with pt in supine to decrease pain x 2 minutes;   Pt educated throughout session about proper  posture and technique with exercises. Improved exercise technique, movement at target joints, use of target muscles after min to mod verbal, visual, tactile cues.    Patient has been reporting increased stiffness as well as weakness in hamstrings and back over the last few sessions.  He has an upcoming MRI scheduled for his lumbar spine.  Session today focused on hooklying strengthening exercises as well as hip and low back stretches.  Patient is able to complete all exercises as instructed.  He denies any increase in his pain with the exception of some left hip pain during left straight leg raises. Patient encouraged to continue HEP and follow-up as scheduled.  He has yet to achieve maximal benefit from PT services. Patient will continue benefit from skilled PT services to addressdeficits in strength and pain in order to return to full function at home.                                        PT Long Term Goals - 04/26/20 1624      PT LONG TERM GOAL #1   Title Pt will improve FOTO score to target goal (49) to demonstrate clinically significant improvement in functional mobility.    Baseline 01/06/20: 38; 01/29/20: 45; 04/08/20: 44    Time 8    Period Weeks    Status Partially Met    Target Date 05/27/20      PT LONG TERM GOAL #2   Title Pt will improve 5xSTS score by at least 5 sec with no UE support to demonstrate clinically significant improvement in BLE strength.    Baseline 11/18: 27 sec with BUE suppport, 01/29/20: 15.9s, 04/08/20: 13.73 sec    Time 8    Period Weeks    Status Achieved      PT LONG TERM GOAL #3   Title Pt will improve 2 MWT with no AD by 40' to demonstrate clinically significant improvement in endurance to improve household and community ambulation.    Baseline 11/18: 175' no AD; 01/29/20: Deferred due to elevated resting BP;  04/08/20: 307' no AD    Time 8    Period Weeks    Status Achieved      PT LONG TERM GOAL #4   Title  Pt will ambulate with LRAD with normalized gait pattern > 5 min to reduce tripping hazard with shuffling gait.    Baseline 11/18: Pt amb with single LSC and shuffling gait after 2 min due to fatigue,    Time 8    Period Weeks    Status Deferred    Target Date 05/27/20                 Plan - 05/13/20 1203    Clinical Impression Statement Patient has been reporting increased stiffness as well as weakness in hamstrings and back over the last few sessions.  He has an upcoming MRI scheduled for his lumbar spine.  Session today focused on hooklying strengthening exercises as well as hip and low back stretches.  Patient is able to complete all exercises as instructed.  He denies any increase in his pain with the exception of some left hip pain during left straight leg raises. Patient encouraged to continue HEP and follow-up as scheduled.  He has yet to achieve maximal benefit from PT services. Patient will continue benefit from skilled PT services to address deficits in strength and pain in order to return to full function at home.    Personal Factors and Comorbidities Age;Comorbidity 3+;Education;Past/Current Experience;Time since onset of injury/illness/exacerbation    Comorbidities spinal cord stimulator, paroxysmal A-fib, DM-Type 2, HTN, chronic kidney disease.    Examination-Activity Limitations Bed Mobility;Stand;Locomotion Level;Toileting;Bend;Reach Overhead;Carry;Squat;Hygiene/Grooming;Stairs    Examination-Participation Restrictions Occupation;Community Activity;Yard Work    Stability/Clinical Decision Making Unstable/Unpredictable    Rehab Potential Fair    PT Frequency 2x / week    PT Duration 8 weeks    PT Treatment/Interventions ADLs/Self Care Home Management;Moist Heat;Gait training;Stair training;Therapeutic activities;Therapeutic exercise;Balance training;Neuromuscular re-education;Patient/family education;Manual techniques;Functional mobility training;Cryotherapy    PT Next Visit  Plan Progress  HEP when appropriate, Improve LE strength and endurance.    PT Home Exercise Plan spinal cord stimulator, paroxysmal A-fib, DM-Type 2, HTN, chronic kidney disease.    Consulted and Agree with Plan of Care Patient           Patient will benefit from skilled therapeutic intervention in order to improve the following deficits and impairments:  Abnormal gait,Pain,Improper body mechanics,Decreased mobility,Postural dysfunction,Decreased activity tolerance,Decreased endurance,Decreased range of motion,Decreased strength,Decreased balance,Difficulty walking,Impaired flexibility  Visit Diagnosis: Chronic bilateral low back pain without sciatica  Muscle weakness (generalized)     Problem List Patient Active Problem List   Diagnosis Date Noted  . Cervical facet joint syndrome 04/08/2020  . Cervicalgia 04/08/2020  . Spinal cord stimulator status 12/11/2019  . History of 2019 novel coronavirus disease (COVID-19) 10/02/2019  . CKD (chronic kidney disease) stage 3, GFR 30-59 ml/min (HCC) 01/19/2019  . Acquired thrombophilia ([place]) 01/19/2019  . 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 Postlaminectomy syndrome, lumbar region 01/16/2019  . History of fusion of lumbar spine (L2-L5) 01/16/2019  . Chronic radicular lumbar pain 01/16/2019  . HNP (herniated nucleus pulposus), lumbar 04/29/2018  . Advanced care planning/counseling discussion 09/28/2016  . Spinal stenosis, lumbar region, 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/13/2016  . Hyperlipidemia associated with type 2 diabetes mellitus ([place]) 07/14/2015  . Anemia 06/30/2015  . 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 OSA (obstructive sleep apnea) 03/23/2015  . Type 2 diabetes mellitus with diabetic chronic kidney disease ([place]) 01/05/2015  . Hypertension associated with diabetes ([place]) 09/28/2014  . 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[place]) 09/28/2014  . H/O prior ablation treatment 10/19/2011  .  Paroxysmal atrial fibrillation (HCC) 10/19/2011    Phillips Grout PT, DPT, GCS  Eleina Jergens 05/13/2020, 12:09 PM  [facility] [address]. [place], [place], [address] Phone: [phone]   Fax:  [phone]  Name: [name]. MRN: [id number] Date of Birth: December 23, 1940

## 2020-05-17 ENCOUNTER — Ambulatory Visit: Payer: Medicare Other | Attending: Student in an Organized Health Care Education/Training Program

## 2020-05-17 ENCOUNTER — Other Ambulatory Visit: Payer: Self-pay

## 2020-05-17 DIAGNOSIS — G8929 Other chronic pain: Secondary | ICD-10-CM | POA: Diagnosis not present

## 2020-05-17 DIAGNOSIS — M545 Low back pain, unspecified: Secondary | ICD-10-CM | POA: Diagnosis not present

## 2020-05-17 DIAGNOSIS — M6281 Muscle weakness (generalized): Secondary | ICD-10-CM | POA: Insufficient documentation

## 2020-05-17 DIAGNOSIS — M542 Cervicalgia: Secondary | ICD-10-CM | POA: Diagnosis not present

## 2020-05-17 NOTE — Therapy (Signed)
[facility] [address]. [place], [place], [address] Phone: [phone]   Fax:  [phone]  Physical Therapy Treatment  Patient Details  Name: Sean Reyes. MRN: [id number] Date of Birth: 11/14/1940 Referring Provider (PT): Gillis Santa, MD   Encounter Date: 05/17/2020   PT End of Session - 05/17/20 1543    Visit Number 23    Number of Visits 41    Date for PT Re-Evaluation 05/27/20    Authorization Type eval: 01/01/20    PT Start Time 1536    PT Stop Time 1615    PT Time Calculation (min) 39 min    Activity Tolerance Patient tolerated treatment well    Behavior During Therapy [facility] for tasks assessed/performed           Past Medical History:  Diagnosis Date  . Anemia   . Anxiety   . Arthritis   . Atrial fibrillation ([place])   . Cataracts, bilateral   . Complication of anesthesia    pt reports low BP's after surgery at [facility] and difficulty awakening  . Depression   . Diabetes ([place])    dx 6-8 yrs ago  . Dysrhythmia    a-fib  . GERD (gastroesophageal reflux disease)    OCC TAKES ALKA SELTZER  . History of kidney stones    10-15 yrs ago  . 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 Hyperlipidemia   . Hypertension   . Nocturia   . S/P ablation of atrial fibrillation    Ablative therapy  . Sleep apnea    CPAP   . Tachycardia, unspecified     Past Surgical History:  Procedure Laterality Date  . ABLATION    . ANTERIOR LAT LUMBAR FUSION N/A 06/27/2017   Procedure: Anterior Lateral Lumbar Interbody  Fusion - Lumbar Two-Lumbar Three - Lumbar Three-Lumbar Four, Posterior Lumbar Interbody Fusion Lumbar Four- Five;  Surgeon: Kary Kos, MD;  Location: [place];  Service: Neurosurgery;  Laterality: N/A;  Anterior Lateral Lumbar Interbody  Fusion - Lumbar Two-Lumbar Three - Lumbar Three-Lumbar Four, Posterior Lumbar Interbody Fusion Lumbar Four- Five  . BACK SURGERY    . CARDIOVERSION N/A 08/29/2018    Procedure: CARDIOVERSION;  Surgeon: Corey Skains, MD;  Location: [facility];  Service: Cardiovascular;  Laterality: N/A;  . CARDIOVERSION N/A 09/24/2018   Procedure: CARDIOVERSION;  Surgeon: Corey Skains, MD;  Location: [facility];  Service: Cardiovascular;  Laterality: N/A;  . COLONOSCOPY WITH PROPOFOL N/A 10/05/2015   Procedure: COLONOSCOPY WITH PROPOFOL;  Surgeon: Lollie Sails, MD;  Location: [facility] ENDOSCOPY;  Service: Endoscopy;  Laterality: N/A;  . ESOPHAGOGASTRODUODENOSCOPY (EGD) WITH PROPOFOL N/A 04/01/2018   Procedure: ESOPHAGOGASTRODUODENOSCOPY (EGD) WITH PROPOFOL;  Surgeon: Lollie Sails, MD;  Location: [facility] ENDOSCOPY;  Service: Endoscopy;  Laterality: N/A;  . HERNIA REPAIR    . JOINT REPLACEMENT Bilateral    hips  RT+  LEFT X2   . LUMBAR LAMINECTOMY/DECOMPRESSION MICRODISCECTOMY Left 09/13/2016   Procedure: Microdiscectomy - Lumbar two-three,  Lumbar three- - left;  Surgeon: Kary Kos, MD;  Location: [place];  Service: Neurosurgery;  Laterality: Left;  . SPINAL CORD STIMULATOR INSERTION  07/08/2019  . TONSILLECTOMY      There were no vitals filed for this visit.   Subjective Assessment - 05/17/20 1541    Subjective Pt reports that he continues to have a lot of issues with his back. He rates it as a 7/10 upon arrival today.  He continues with weakness in his back and hamstrings.  He has scheduled his lumbar MRI for this Thursday. No specific questions upon arrival    Pertinent History Pt is a 80 y.o. male referred to PT for LBP. Pt has extensive PMH of failed back surgical syndrome, postlaminectomy syndrome, chronic radicular pain, hx of lumbar fusion spine, spinal cord stimulator, paroxysmal A-fib, DM-Type 2, HTN, chronic kidney disease. Pt reports 4 fusions on lumbar spine due to protruding disks which was ~2 years ago. Pt reports that his spine stimulator has started to help some but his biggest issue is his decreased strength in his spine, legs, and core. In sitting pt has  0/10 NPS. With household distance pt has 4-5/10 NPS but is instantly relieved with sitting. With long walks, from the clinic to the car (100 yds)  8-9/10 NPS. The pain is described as dull pain. Pt denies radicular symptoms. [name] LBP is located acros his belt line right above his buttocks. Pt reports difficulty with his balance and asc/desc stairs but at home he uses no AD at home. Pt does report a fall 1.5 months ago with his cane and fell forward onto his head where he reports he could not catch himself going forward where he hit his head. This is his only fall with no injury. [name] big goal with PT is to be able to indep replace your deck.    How long can you sit comfortably? unlimited    How long can you stand comfortably? 4-5 min    How long can you walk comfortably? 4-5 min    Patient Stated Goals Indep rebuild deck.    Currently in Pain? Yes    Pain Score 7     Pain Location Back    Pain Orientation Lower    Pain Descriptors / Indicators Aching    Pain Type Chronic pain    Pain Onset More than a month ago    Pain Frequency Intermittent               TREATMENT   Ther-ex NuStep L2 x 5 minutes for warm-up during history, 1 minute unbilled; Hooklying marchesx 20 BLE; Hooklying lumbar rotation knee rocking x 1 minute; Hooklyingadductorsqueezewith manual resistance from therapistx 20BLE; Hooklying clams with manual resistance from therapist x 20 BLE; Hooklying bridgesx 20; SLR x20 RLE, attempted on LLE but only able to perform 10 reps before stopping due to back pain; Hooklying isometric manually resisted trunk rotation 5s hold x 10 each direction; Bilateral distal and proximal hamstring stretches x 45 seconds each; Single knee to chest stretch x 45s BLE; Figure 4 stretch x 45s BLE; FADIR stretch x 45s BLE; HS stretches proximal and distal x 45s each BLE;   Pt educated throughout session about proper posture and technique with exercises. Improved exercise  technique, movement at target joints, use of target muscles after min to mod verbal, visual, tactile cues.    Patient has been reporting increased stiffness as well as weakness in hamstrings and back over the last few sessions. He is struggling to progress strengthening at this time due to increase in his pain at rest and during sessions. Once again session today focused on hooklying strengthening exercises as well as hip and low back stretches.  Patient has MRI scheduled for this week. Patient encouraged to continue HEP and follow-up as scheduled. He has yet to achieve maximal benefit from PT services. Patient will continue benefit from skilled PT services to addressdeficits in strength and  pain in order to return to full function at home.                            PT Long Term Goals - 04/26/20 1624      PT LONG TERM GOAL #1   Title Pt will improve FOTO score to target goal (49) to demonstrate clinically significant improvement in functional mobility.    Baseline 01/06/20: 38; 01/29/20: 45; 04/08/20: 44    Time 8    Period Weeks    Status Partially Met    Target Date 05/27/20      PT LONG TERM GOAL #2   Title Pt will improve 5xSTS score by at least 5 sec with no UE support to demonstrate clinically significant improvement in BLE strength.    Baseline 11/18: 27 sec with BUE suppport, 01/29/20: 15.9s, 04/08/20: 13.73 sec    Time 8    Period Weeks    Status Achieved      PT LONG TERM GOAL #3   Title Pt will improve 2 MWT with no AD by 40' to demonstrate clinically significant improvement in endurance to improve household and community ambulation.    Baseline 11/18: 175' no AD; 01/29/20: Deferred due to elevated resting BP;  04/08/20: 307' no AD    Time 8    Period Weeks    Status Achieved      PT LONG TERM GOAL #4   Title Pt will ambulate with LRAD with normalized gait pattern > 5 min to reduce tripping hazard with shuffling gait.    Baseline 11/18: Pt amb  with single LSC and shuffling gait after 2 min due to fatigue,    Time 8    Period Weeks    Status Deferred    Target Date 05/27/20                 Plan - 05/17/20 1543    Clinical Impression Statement Patient has been reporting increased stiffness as well as weakness in hamstrings and back over the last few sessions. He is struggling to progress strengthening at this time due to increase in his pain at rest and during sessions. Once again session today focused on hooklying strengthening exercises as well as hip and low back stretches.  Patient has MRI scheduled for this week. Patient encouraged to continue HEP and follow-up as scheduled.  He has yet to achieve maximal benefit from PT services. Patient will continue benefit from skilled PT services to address deficits in strength and pain in order to return to full function at home.    Personal Factors and Comorbidities Age;Comorbidity 3+;Education;Past/Current Experience;Time since onset of injury/illness/exacerbation    Comorbidities spinal cord stimulator, paroxysmal A-fib, DM-Type 2, HTN, chronic kidney disease.    Examination-Activity Limitations Bed Mobility;Stand;Locomotion Level;Toileting;Bend;Reach Overhead;Carry;Squat;Hygiene/Grooming;Stairs    Examination-Participation Restrictions Occupation;Community Activity;Yard Work    Stability/Clinical Decision Making Unstable/Unpredictable    Rehab Potential Fair    PT Frequency 2x / week    PT Duration 8 weeks    PT Treatment/Interventions ADLs/Self Care Home Management;Moist Heat;Gait training;Stair training;Therapeutic activities;Therapeutic exercise;Balance training;Neuromuscular re-education;Patient/family education;Manual techniques;Functional mobility training;Cryotherapy    PT Next Visit Plan Progress HEP when appropriate, Improve LE strength and endurance.    PT Home Exercise Plan spinal cord stimulator, paroxysmal A-fib, DM-Type 2, HTN, chronic kidney disease.    Consulted and  Agree with Plan of Care Patient           Patient  will benefit from skilled therapeutic intervention in order to improve the following deficits and impairments:  Abnormal gait,Pain,Improper body mechanics,Decreased mobility,Postural dysfunction,Decreased activity tolerance,Decreased endurance,Decreased range of motion,Decreased strength,Decreased balance,Difficulty walking,Impaired flexibility  Visit Diagnosis: Chronic bilateral low back pain without sciatica  Muscle weakness (generalized)     Problem List Patient Active Problem List   Diagnosis Date Noted  . Cervical facet joint syndrome 04/08/2020  . Cervicalgia 04/08/2020  . Spinal cord stimulator status 12/11/2019  . History of 2019 novel coronavirus disease (COVID-19) 10/02/2019  . CKD (chronic kidney disease) stage 3, GFR 30-59 ml/min (HCC) 01/19/2019  . Acquired thrombophilia ([place]) 01/19/2019  . 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 Postlaminectomy syndrome, lumbar region 01/16/2019  . History of fusion of lumbar spine (L2-L5) 01/16/2019  . Chronic radicular lumbar pain 01/16/2019  . HNP (herniated nucleus pulposus), lumbar 04/29/2018  . Advanced care planning/counseling discussion 09/28/2016  . Spinal stenosis, lumbar region, 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/13/2016  . Hyperlipidemia associated with type 2 diabetes mellitus ([place]) 07/14/2015  . Anemia 06/30/2015  . 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 OSA (obstructive sleep apnea) 03/23/2015  . Type 2 diabetes mellitus with diabetic chronic kidney disease ([place]) 01/05/2015  . Hypertension associated with diabetes ([place]) 09/28/2014  . 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[place]) 09/28/2014  . H/O prior ablation treatment 10/19/2011  . Paroxysmal atrial fibrillation (HCC) 10/19/2011   Phillips Grout PT, DPT, GCS  Rachelann Enloe 05/17/2020, 10:02 PM  [facility] [address] [place], [place], [address] Phone: [phone]   Fax:  [phone]  Name: [name]. MRN: [id number] Date of Birth: 09-Apr-1940

## 2020-05-18 NOTE — Patient Instructions (Incomplete)
TREATMENT   Ther-ex NuStep L2x 5 minutes for warm-up during history, 1 minute unbilled; Hooklying marchesx 20 BLE; Hooklying lumbar rotation knee rocking x 1 minute; Hooklyingadductorsqueezewith manual resistance from therapistx 20BLE; Hooklying clams with manual resistance from therapist x 20 BLE; Hooklying bridgesx 20; SLR x20 RLE, attempted on LLE but only able to perform10reps before stopping due to back pain; Hooklying isometric manually resisted trunk rotation 5s hold x 10 each direction; Bilateral distal and proximal hamstring stretches x 45 seconds each; Single knee to chest stretchx 45s BLE; Figure 4 stretchx 45s BLE; FADIR stretch x 45s BLE; HS stretches proximal and distalx 45s each BLE;   Pt educated throughout session about proper posture and technique with exercises. Improved exercise technique, movement at target joints, use of target muscles after min to mod verbal, visual, tactile cues.    Patient has been reporting increased stiffness as well as weakness in hamstrings and back over the last few sessions. He is struggling to progress strengthening at this time due to increase in his pain at rest and during sessions. Once again session today focused on hooklying strengthening exercises as well as hip and low back stretches.Patient has MRI scheduled for this week. Patient encouraged to continue HEP and follow-up as scheduled. He has yet to achieve maximal benefit from PT services. Patient will continue benefit from skilled PT services to addressdeficits in strength and pain in order to return to full function at home.

## 2020-05-19 ENCOUNTER — Ambulatory Visit: Payer: Medicare Other

## 2020-05-19 ENCOUNTER — Other Ambulatory Visit: Payer: Self-pay

## 2020-05-19 DIAGNOSIS — M6281 Muscle weakness (generalized): Secondary | ICD-10-CM

## 2020-05-19 DIAGNOSIS — M545 Low back pain, unspecified: Secondary | ICD-10-CM | POA: Diagnosis not present

## 2020-05-19 DIAGNOSIS — G8929 Other chronic pain: Secondary | ICD-10-CM

## 2020-05-19 NOTE — Therapy (Signed)
[facility] [address] [place], [place], [address] Phone: [phone]   Fax:  [phone]  Physical Therapy Treatment  Patient Details  Name: Sean Reyes. MRN: [id number] Date of Birth: Jan 17, 1941 Referring Provider (PT): Gillis Santa, MD   Encounter Date: 05/19/2020   PT End of Session - 05/19/20 1158    Visit Number 24    Number of Visits 41    Date for PT Re-Evaluation 05/27/20    Authorization Type eval: 01/01/20    PT Start Time 1155    PT Stop Time 1225    PT Time Calculation (min) 30 min    Activity Tolerance Patient tolerated treatment well    Behavior During Therapy [facility] for tasks assessed/performed           Past Medical History:  Diagnosis Date  . Anemia   . Anxiety   . Arthritis   . Atrial fibrillation ([place])   . Cataracts, bilateral   . Complication of anesthesia    pt reports low BP's after surgery at [facility] and difficulty awakening  . Depression   . Diabetes ([place])    dx 6-8 yrs ago  . Dysrhythmia    a-fib  . GERD (gastroesophageal reflux disease)    OCC TAKES ALKA SELTZER  . History of kidney stones    10-15 yrs ago  . 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 Hyperlipidemia   . Hypertension   . Nocturia   . S/P ablation of atrial fibrillation    Ablative therapy  . Sleep apnea    CPAP   . Tachycardia, unspecified     Past Surgical History:  Procedure Laterality Date  . ABLATION    . ANTERIOR LAT LUMBAR FUSION N/A 06/27/2017   Procedure: Anterior Lateral Lumbar Interbody  Fusion - Lumbar Two-Lumbar Three - Lumbar Three-Lumbar Four, Posterior Lumbar Interbody Fusion Lumbar Four- Five;  Surgeon: Kary Kos, MD;  Location: [place];  Service: Neurosurgery;  Laterality: N/A;  Anterior Lateral Lumbar Interbody  Fusion - Lumbar Two-Lumbar Three - Lumbar Three-Lumbar Four, Posterior Lumbar Interbody Fusion Lumbar Four- Five  . BACK SURGERY    . CARDIOVERSION N/A 08/29/2018    Procedure: CARDIOVERSION;  Surgeon: Corey Skains, MD;  Location: [facility];  Service: Cardiovascular;  Laterality: N/A;  . CARDIOVERSION N/A 09/24/2018   Procedure: CARDIOVERSION;  Surgeon: Corey Skains, MD;  Location: [facility];  Service: Cardiovascular;  Laterality: N/A;  . COLONOSCOPY WITH PROPOFOL N/A 10/05/2015   Procedure: COLONOSCOPY WITH PROPOFOL;  Surgeon: Lollie Sails, MD;  Location: [facility] END[facility];  Service: Endoscopy;  Laterality: N/A;  . ESOPHAGOGASTRODUODENOSCOPY (EGD) WITH PROPOFOL N/A 04/01/2018   Procedure: ESOPHAGOGASTRODUODENOSCOPY (EGD) WITH PROPOFOL;  Surgeon: Lollie Sails, MD;  Location: [facility] ENDOSCOPY;  Service: Endoscopy;  Laterality: N/A;  . HERNIA REPAIR    . JOINT REPLACEMENT Bilateral    hips  RT+  LEFT X2   . LUMBAR LAMINECTOMY/DECOMPRESSION MICRODISCECTOMY Left 09/13/2016   Procedure: Microdiscectomy - Lumbar two-three,  Lumbar three- - left;  Surgeon: Kary Kos, MD;  Location: [place];  Service: Neurosurgery;  Laterality: Left;  . SPINAL CORD STIMULATOR INSERTION  07/08/2019  . TONSILLECTOMY      There were no vitals filed for this visit.   Subjective Assessment - 05/19/20 1156    Subjective Pt reports that he continues to have a lot of issues with his back and is scheduled for an MRI tomorrow. He rates  his pain 7/10 upon arrival today.  He continues with weakness in his back and hamstrings. No specific questions upon arrival    Pertinent History Pt is a 80 y.o. male referred to PT for LBP. Pt has extensive PMH of failed back surgical syndrome, postlaminectomy syndrome, chronic radicular pain, hx of lumbar fusion spine, spinal cord stimulator, paroxysmal A-fib, DM-Type 2, HTN, chronic kidney disease. Pt reports 4 fusions on lumbar spine due to protruding disks which was ~2 years ago. Pt reports that his spine stimulator has started to help some but his biggest issue is his decreased strength in his spine, legs, and core. In sitting pt has 0/10 NPS. With  household distance pt has 4-5/10 NPS but is instantly relieved with sitting. With long walks, from the clinic to the car (100 yds)  8-9/10 NPS. The pain is described as dull pain. Pt denies radicular symptoms. [name] LBP is located acros his belt line right above his buttocks. Pt reports difficulty with his balance and asc/desc stairs but at home he uses no AD at home. Pt does report a fall 1.5 months ago with his cane and fell forward onto his head where he reports he could not catch himself going forward where he hit his head. This is his only fall with no injury. [name] big goal with PT is to be able to indep replace your deck.    How long can you sit comfortably? unlimited    How long can you stand comfortably? 4-5 min    How long can you walk comfortably? 4-5 min    Patient Stated Goals Indep rebuild deck.    Currently in Pain? Yes    Pain Score 7     Pain Location Back    Pain Orientation Lower    Pain Descriptors / Indicators Aching    Pain Type Chronic pain    Pain Onset More than a month ago    Pain Frequency Intermittent              TREATMENT   Ther-ex NuStep L2 x 5 minutes for warm-up during history, 2 minutes unbilled; Hooklying marchesx 20 BLE; Hooklying lumbar rotation knee rocking x 1 minute; Hooklyingadductorsqueezewith manual resistance from therapistx 20BLE; Hooklying clams with manual resistance from therapist x 20 BLE; Hooklying bridgesx 20; Hooklying isometric manually resisted trunk rotation 5s hold x 10 each direction; Hooklying heel slides with manually resisted extension x 10 BLE; Attempted seated HS stretch but too painful for patient; Seated marches x 10 BLE;   Pt educated throughout session about proper posture and technique with exercises. Improved exercise technique, movement at target joints, use of target muscles after min to mod verbal, visual, tactile cues.    Patient has been reporting increased stiffness as well as weakness in  hamstrings and back over the last few sessions. He continues to struggle to progress strengthening at this time due to increase in his pain at rest and during sessions. Once again session today focused on hooklying strengthening exercises. He arrived late so session was somewhat abbreviated. Patient has MRI scheduled for tomorrow and will reassess therapy needs after neurosurgery develops a plan of care based on the results. Patient encouraged to continue HEP and follow-up as scheduled. He has yet to achieve maximal benefit from PT services. Patient will continue benefit from skilled PT services to addressdeficits in strength and pain in order to return to full function at home.  PT Long Term Goals - 04/26/20 1624      PT LONG TERM GOAL #1   Title Pt will improve FOTO score to target goal (49) to demonstrate clinically significant improvement in functional mobility.    Baseline 01/06/20: 38; 01/29/20: 45; 04/08/20: 44    Time 8    Period Weeks    Status Partially Met    Target Date 05/27/20      PT LONG TERM GOAL #2   Title Pt will improve 5xSTS score by at least 5 sec with no UE support to demonstrate clinically significant improvement in BLE strength.    Baseline 11/18: 27 sec with BUE suppport, 01/29/20: 15.9s, 04/08/20: 13.73 sec    Time 8    Period Weeks    Status Achieved      PT LONG TERM GOAL #3   Title Pt will improve 2 MWT with no AD by 40' to demonstrate clinically significant improvement in endurance to improve household and community ambulation.    Baseline 11/18: 175' no AD; 01/29/20: Deferred due to elevated resting BP;  04/08/20: 307' no AD    Time 8    Period Weeks    Status Achieved      PT LONG TERM GOAL #4   Title Pt will ambulate with LRAD with normalized gait pattern > 5 min to reduce tripping hazard with shuffling gait.    Baseline 11/18: Pt amb with single LSC and shuffling gait after 2 min due to  fatigue,    Time 8    Period Weeks    Status Deferred    Target Date 05/27/20                 Plan - 05/19/20 1159    Clinical Impression Statement Patient has been reporting increased stiffness as well as weakness in hamstrings and back over the last few sessions. He continues to struggle to progress strengthening at this time due to increase in his pain at rest and during sessions. Once again session today focused on hooklying strengthening exercises. He arrived late so session was somewhat abbreviated. Patient has MRI scheduled for tomorrow and will reassess therapy needs after neurosurgery develops a plan of care based on the results. Patient encouraged to continue HEP and follow-up as scheduled.  He has yet to achieve maximal benefit from PT services. Patient will continue benefit from skilled PT services to address deficits in strength and pain in order to return to full function at home.    Personal Factors and Comorbidities Age;Comorbidity 3+;Education;Past/Current Experience;Time since onset of injury/illness/exacerbation    Comorbidities spinal cord stimulator, paroxysmal A-fib, DM-Type 2, HTN, chronic kidney disease.    Examination-Activity Limitations Bed Mobility;Stand;Locomotion Level;Toileting;Bend;Reach Overhead;Carry;Squat;Hygiene/Grooming;Stairs    Examination-Participation Restrictions Occupation;Community Activity;Yard Work    Stability/Clinical Decision Making Unstable/Unpredictable    Rehab Potential Fair    PT Frequency 2x / week    PT Duration 8 weeks    PT Treatment/Interventions ADLs/Self Care Home Management;Moist Heat;Gait training;Stair training;Therapeutic activities;Therapeutic exercise;Balance training;Neuromuscular re-education;Patient/family education;Manual techniques;Functional mobility training;Cryotherapy    PT Next Visit Plan Progress HEP when appropriate, Improve LE strength and endurance.    PT Home Exercise Plan spinal cord stimulator, paroxysmal  A-fib, DM-Type 2, HTN, chronic kidney disease.    Consulted and Agree with Plan of Care Patient           Patient will benefit from skilled therapeutic intervention in order to improve the following deficits and impairments:  Abnormal gait,Pain,Improper body mechanics,Decreased mobility,Postural dysfunction,Decreased  activity tolerance,Decreased endurance,Decreased range of motion,Decreased strength,Decreased balance,Difficulty walking,Impaired flexibility  Visit Diagnosis: Chronic bilateral low back pain without sciatica  Muscle weakness (generalized)     Problem List Patient Active Problem List   Diagnosis Date Noted  . Cervical facet joint syndrome 04/08/2020  . Cervicalgia 04/08/2020  . Spinal cord stimulator status 12/11/2019  . History of 2019 novel coronavirus disease (COVID-19) 10/02/2019  . CKD (chronic kidney disease) stage 3, GFR 30-59 ml/min (HCC) 01/19/2019  . Acquired thrombophilia ([place]) 01/19/2019  . 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 Postlaminectomy syndrome, lumbar region 01/16/2019  . History of fusion of lumbar spine (L2-L5) 01/16/2019  . Chronic radicular lumbar pain 01/16/2019  . HNP (herniated nucleus pulposus), lumbar 04/29/2018  . Advanced care planning/counseling discussion 09/28/2016  . Spinal stenosis, lumbar region, 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/13/2016  . Hyperlipidemia associated with type 2 diabetes mellitus ([place]) 07/14/2015  . Anemia 06/30/2015  . 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 OSA (obstructive sleep apnea) 03/23/2015  . Type 2 diabetes mellitus with diabetic chronic kidney disease ([place]) 01/05/2015  . Hypertension associated with diabetes ([place]) 09/28/2014  . 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[place]) 09/28/2014  . H/O prior ablation treatment 10/19/2011  . Paroxysmal atrial fibrillation (HCC) 10/19/2011   Phillips Grout PT, DPT, GCS  Austina Constantin 05/19/2020, 2:29 PM  [place] [facility] [address] [place], [place], [address] Phone: [phone]   Fax:  [phone]  Name: Sean Reyes. MRN: [id number] Date of Birth: 11-Mar-1940

## 2020-05-20 ENCOUNTER — Ambulatory Visit
Admission: RE | Admit: 2020-05-20 | Discharge: 2020-05-20 | Disposition: A | Discharge status: Home / Self Care | Payer: Medicare Other | Source: Ambulatory Visit | Attending: Neurosurgery | Admitting: Neurosurgery

## 2020-05-20 ENCOUNTER — Other Ambulatory Visit: Payer: Self-pay

## 2020-05-20 DIAGNOSIS — R29898 Other symptoms and signs involving the musculoskeletal system: Secondary | ICD-10-CM

## 2020-05-20 DIAGNOSIS — M79604 Pain in right leg: Secondary | ICD-10-CM

## 2020-05-20 DIAGNOSIS — R531 Weakness: Secondary | ICD-10-CM | POA: Diagnosis not present

## 2020-05-20 DIAGNOSIS — M4804 Spinal stenosis, thoracic region: Secondary | ICD-10-CM | POA: Diagnosis not present

## 2020-05-20 DIAGNOSIS — M4314 Spondylolisthesis, thoracic region: Secondary | ICD-10-CM | POA: Diagnosis not present

## 2020-05-20 DIAGNOSIS — M545 Low back pain, unspecified: Secondary | ICD-10-CM | POA: Diagnosis not present

## 2020-05-20 DIAGNOSIS — G629 Polyneuropathy, unspecified: Secondary | ICD-10-CM

## 2020-05-20 DIAGNOSIS — M5124 Other intervertebral disc displacement, thoracic region: Secondary | ICD-10-CM | POA: Diagnosis not present

## 2020-05-20 NOTE — Patient Instructions (Incomplete)
TREATMENT   Ther-ex NuStep L2x 5 minutes for warm-up during history, 2 minutes unbilled; Hooklying marchesx 20 BLE; Hooklying lumbar rotation knee rocking x 1 minute; Hooklyingadductorsqueezewith manual resistance from therapistx 20BLE; Hooklying clams with manual resistance from therapist x 20 BLE; Hooklying bridgesx 20; Hooklying isometric manually resisted trunk rotation 5s hold x 10 each direction; Hooklying heel slides with manually resisted extension x 10 BLE; Attempted seated HS stretch but too painful for patient; Seated marches x 10 BLE;   Pt educated throughout session about proper posture and technique with exercises. Improved exercise technique, movement at target joints, use of target muscles after min to mod verbal, visual, tactile cues.    Patient has been reporting increased stiffness as well as weakness in hamstrings and back over the last few sessions. He continues to struggle to progress strengthening at this time due to increase in his pain at rest and during sessions. Once again session today focused on hooklying strengthening exercises. He arrived late so session was somewhat abbreviated.Patient has MRI scheduled for tomorrow and will reassess therapy needs after neurosurgery develops a plan of care based on the results. Patient encouraged to continue HEP and follow-up as scheduled. He has yet to achieve maximal benefit from PT services. Patient will continue benefit from skilled PT services to addressdeficits in strength and pain in order to return to full function at home.

## 2020-05-24 ENCOUNTER — Ambulatory Visit: Payer: Medicare Other | Admitting: Physical Therapy

## 2020-05-24 DIAGNOSIS — M6281 Muscle weakness (generalized): Secondary | ICD-10-CM

## 2020-05-24 DIAGNOSIS — G8929 Other chronic pain: Secondary | ICD-10-CM

## 2020-05-25 ENCOUNTER — Ambulatory Visit (INDEPENDENT_AMBULATORY_CARE_PROVIDER_SITE_OTHER): Payer: Medicare Other | Admitting: General Practice

## 2020-05-25 ENCOUNTER — Telehealth: Payer: Medicare Other | Admitting: General Practice

## 2020-05-25 ENCOUNTER — Other Ambulatory Visit: Payer: Self-pay

## 2020-05-25 ENCOUNTER — Ambulatory Visit: Payer: Medicare Other

## 2020-05-25 VITALS — BP 107/73 | HR 57

## 2020-05-25 DIAGNOSIS — G8929 Other chronic pain: Secondary | ICD-10-CM

## 2020-05-25 DIAGNOSIS — Z9689 Presence of other specified functional implants: Secondary | ICD-10-CM

## 2020-05-25 DIAGNOSIS — M545 Low back pain, unspecified: Secondary | ICD-10-CM | POA: Diagnosis not present

## 2020-05-25 DIAGNOSIS — R339 Retention of urine, unspecified: Secondary | ICD-10-CM

## 2020-05-25 DIAGNOSIS — M961 Postlaminectomy syndrome, not elsewhere classified: Secondary | ICD-10-CM

## 2020-05-25 DIAGNOSIS — I152 Hypertension secondary to endocrine disorders: Secondary | ICD-10-CM

## 2020-05-25 DIAGNOSIS — E1159 Type 2 diabetes mellitus with other circulatory complications: Secondary | ICD-10-CM | POA: Diagnosis not present

## 2020-05-25 DIAGNOSIS — M48062 Spinal stenosis, lumbar region with neurogenic claudication: Secondary | ICD-10-CM

## 2020-05-25 DIAGNOSIS — M6281 Muscle weakness (generalized): Secondary | ICD-10-CM

## 2020-05-25 NOTE — Therapy (Signed)
[facility] [address] [place], [place], [address] Phone: [phone]   Fax:  [phone]  Physical Therapy Treatment/Recertification  Patient Details  Name: Sean Reyes. MRN: [id number] Date of Birth: [birth year] Referring Provider (PT): Gillis Santa, MD   Encounter Date: 05/25/2020   PT End of Session - 05/25/20 1450    Visit Number 25    Number of Visits 57    Date for PT Re-Evaluation 07/20/20    Authorization Type eval: 01/01/20    PT Start Time 1445    PT Stop Time 1530    PT Time Calculation (min) 45 min    Activity Tolerance Patient tolerated treatment well    Behavior During Therapy [facility] for tasks assessed/performed           Past Medical History:  Diagnosis Date  . Anemia   . Anxiety   . Arthritis   . Atrial fibrillation ([place])   . Cataracts, bilateral   . Complication of anesthesia    pt reports low BP's after surgery at [facility] and difficulty awakening  . Depression   . Diabetes ([place])    dx 6-8 yrs ago  . Dysrhythmia    a-fib  . GERD (gastroesophageal reflux disease)    OCC TAKES ALKA SELTZER  . History of kidney stones    10-15 yrs ago  . 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 Hyperlipidemia   . Hypertension   . Nocturia   . S/P ablation of atrial fibrillation    Ablative therapy  . Sleep apnea    CPAP   . Tachycardia, unspecified     Past Surgical History:  Procedure Laterality Date  . ABLATION    . ANTERIOR LAT LUMBAR FUSION N/A 06/27/2017   Procedure: Anterior Lateral Lumbar Interbody  Fusion - Lumbar Two-Lumbar Three - Lumbar Three-Lumbar Four, Posterior Lumbar Interbody Fusion Lumbar Four- Five;  Surgeon: Kary Kos, MD;  Location: [place];  Service: Neurosurgery;  Laterality: N/A;  Anterior Lateral Lumbar Interbody  Fusion - Lumbar Two-Lumbar Three - Lumbar Three-Lumbar Four, Posterior Lumbar Interbody Fusion Lumbar Four- Five  . BACK SURGERY    . CARDIOVERSION N/A  08/29/2018   Procedure: CARDIOVERSION;  Surgeon: Corey Skains, MD;  Location: [facility];  Service: Cardiovascular;  Laterality: N/A;  . CARDIOVERSION N/A 09/24/2018   Procedure: CARDIOVERSION;  Surgeon: Corey Skains, MD;  Location: [facility];  Service: Cardiovascular;  Laterality: N/A;  . COLONOSCOPY WITH PROPOFOL N/A 10/05/2015   Procedure: COLONOSCOPY WITH PROPOFOL;  Surgeon: Lollie Sails, MD;  Location: [facility] ENDOSCOPY;  Service: Endoscopy;  Laterality: N/A;  . ESOPHAGOGASTRODUODENOSCOPY (EGD) WITH PROPOFOL N/A 04/01/2018   Procedure: ESOPHAGOGASTRODUODENOSCOPY (EGD) WITH PROPOFOL;  Surgeon: Lollie Sails, MD;  Location[facility]COPY;  Service: Endoscopy;  Laterality: N/A;  . HERNIA REPAIR    . JOINT REPLACEMENT Bilateral    hips  RT+  LEFT X2   . LUMBAR LAMINECTOMY/DECOMPRESSION MICRODISCECTOMY Left 09/13/2016   Procedure: Microdiscectomy - Lumbar two-three,  Lumbar three- - left;  Surgeon: Kary Kos, MD;  Location: [place];  Service: Neurosurgery;  Laterality: Left;  . SPINAL CORD STIMULATOR INSERTION  07/08/2019  . TONSILLECTOMY      Vitals:   05/25/20 1458  BP: 107/73  Pulse: (!) 57  SpO2: 98%     Subjective Assessment - 05/25/20 1448    Subjective Pt reports that he is doing well today. He states that his back pain  is better than it has been in a little while and denies any resting pain upon arrival. Reports that his stimulator was turned down and is wondering if that has helped his pain.  He continues with weakness in his back and hamstrings. No specific questions upon arrival.    Pertinent History Pt is a 80 y.o. male referred to PT for LBP. Pt has extensive PMH of failed back surgical syndrome, postlaminectomy syndrome, chronic radicular pain, hx of lumbar fusion spine, spinal cord stimulator, paroxysmal A-fib, DM-Type 2, HTN, chronic kidney disease. Pt reports 4 fusions on lumbar spine due to protruding disks which was ~2 years ago. Pt reports that his spine  stimulator has started to help some but his biggest issue is his decreased strength in his spine, legs, and core. In sitting pt has 0/10 NPS. With household distance pt has 4-5/10 NPS but is instantly relieved with sitting. With long walks, from the clinic to the car (100 yds)  8-9/10 NPS. The pain is described as dull pain. Pt denies radicular symptoms. [name] LBP is located acros his belt line right above his buttocks. Pt reports difficulty with his balance and asc/desc stairs but at home he uses no AD at home. Pt does report a fall 1.5 months ago with his cane and fell forward onto his head where he reports he could not catch himself going forward where he hit his head. This is his only fall with no injury. [name] big goal with PT is to be able to indep replace your deck.    How long can you sit comfortably? unlimited    How long can you stand comfortably? 4-5 min    How long can you walk comfortably? 4-5 min    Patient Stated Goals Indep rebuild deck.    Currently in Pain? No/denies    Pain Onset --                 TREATMENT   Ther-ex NuStep L1-2x 5 minutes for warm-up during history, 2 minutes unbilled; Updated outcome measures with patient: FOTO: 36 5TSTS: 15.3s 2MWT: 280' no assistive device;   Hooklying marchesx 20 BLE; Hooklying lumbar rotation knee rocking x 1 minute; Hooklyingadductorsqueezewith manual resistance from therapistx 10BLE; Hooklying clams with manual resistance from therapist x 10 BLE; Hooklying bridgesx 20; Hooklying heel slides with manually resisted extension x 10 BLE; Supine straight leg manually resisted hip abduction and adduction x 10 BLE; Seated marches x 10 BLE; Seated LAQ 2# ankle weights x 20 BLE; Sit to stand without UE support x 10;   Pt educated throughout session about proper posture and technique with exercises. Improved exercise technique, movement at target joints, use of target muscles after min to mod verbal, visual, tactile  cues.    Patient demonstrates good motivation during session today.Updated goals today including FOTO, 5TSTS  and 2MWT. He had made significant progress at his last goal update but unfortinately since that time he has had a regression with respect to his pain and it has limited his ability to perform more challenging strengthening.  His FOTO , 5TSTS and 2MWT are all worse today than when they were last updated. His 5TSTS and 2MWT are still considerably better than the initial evaluation but his [facility] is very close to his initial evaluation. Again, all of this can be explained due to recent regression of his pain which has limited activity. Given that his back pain is starting to slowly improve it is reasonable ot extend his therapy  for an additional 8 weeks to work toward a return to his previous functional status before his regression. Patient encouraged to continue HEP and follow-up as scheduled. He has yet to achieve maximal benefit from PT services. Patient will continue benefit from skilled PT services to addressdeficits in strength and pain in order to return to full function at home.                         PT Long Term Goals - 05/25/20 1509      PT LONG TERM GOAL #1   Title Pt will improve FOTO score to target goal (49) to demonstrate clinically significant improvement in functional mobility.    Baseline 01/06/20: 38; 01/29/20: 45; 04/08/20: 44; 05/25/20: 36    Time 8    Period Weeks    Status Partially Met    Target Date 07/20/20      PT LONG TERM GOAL #2   Title Pt will improve 5xSTS score by at least 5 sec with no UE support to demonstrate clinically significant improvement in BLE strength.    Baseline 11/18: 27 sec with BUE suppport, 01/29/20: 15.9s, 04/08/20: 13.73 sec; 05/25/20: 15.3s    Time 8    Period Weeks    Status Achieved    Target Date 07/20/20      PT LONG TERM GOAL #3   Title Pt will improve 2 MWT with no AD by 40' to demonstrate clinically  significant improvement in endurance to improve household and community ambulation.    Baseline 11/18: 175' no AD; 01/29/20: Deferred due to elevated resting BP;  04/08/20: 307' no AD; 05/25/20: 280' no AD    Time 8    Period Weeks    Status Achieved      PT LONG TERM GOAL #4   Title Pt will ambulate with LRAD with normalized gait pattern > 5 min to reduce tripping hazard with shuffling gait.    Baseline 11/18: Pt amb with single LSC and shuffling gait after 2 min due to fatigue, 05/25/20: Pt continues to walk with shuffling pattern and limited by fatigue    Time 8    Period Weeks    Status On-going    Target Date 07/20/20                 Plan - 05/25/20 1451    Clinical Impression Statement Patient demonstrates good motivation during session today.  Updated goals today including FOTO, 5TSTS  and 2MWT. He had made significant progress at his last goal update but unfortinately since that time he has had a regression with respect to his pain and it has limited his ability to perform more challenging strengthening.  His FOTO , 5TSTS and 2MWT are all worse today than when they were last updated. His 5TSTS and 2MWT are still considerably better than the initial evaluation but his [facility] is very close to his initial evaluation. Again, all of this can be explained due to recent regression of his pain which has limited activity.  Given that his back pain is starting to slowly improve it is reasonable ot extend his therapy for an additional 8 weeks to work toward a return to his previous functional status before his regression. Patient encouraged to continue HEP and follow-up as scheduled.  He has yet to achieve maximal benefit from PT services.  Patient will continue benefit from skilled PT services to address deficits in strength and pain in order to return  to full function at home.    Personal Factors and Comorbidities Age;Comorbidity 3+;Education;Past/Current Experience;Time since onset of  injury/illness/exacerbation    Comorbidities spinal cord stimulator, paroxysmal A-fib, DM-Type 2, HTN, chronic kidney disease.    Examination-Activity Limitations Bed Mobility;Stand;Locomotion Level;Toileting;Bend;Reach Overhead;Carry;Squat;Hygiene/Grooming;Stairs    Examination-Participation Restrictions Occupation;Community Activity;Yard Work    Stability/Clinical Decision Making Unstable/Unpredictable    Rehab Potential Fair    PT Frequency 2x / week    PT Duration 8 weeks    PT Treatment/Interventions ADLs/Self Care Home Management;Moist Heat;Gait training;Stair training;Therapeutic activities;Therapeutic exercise;Balance training;Neuromuscular re-education;Patient/family education;Manual techniques;Functional mobility training;Cryotherapy    PT Next Visit Plan Progress HEP when appropriate, Improve LE strength and endurance.    PT Home Exercise Plan spinal cord stimulator, paroxysmal A-fib, DM-Type 2, HTN, chronic kidney disease.    Consulted and Agree with Plan of Care Patient           Patient will benefit from skilled therapeutic intervention in order to improve the following deficits and impairments:  Abnormal gait,Pain,Improper body mechanics,Decreased mobility,Postural dysfunction,Decreased activity tolerance,Decreased endurance,Decreased range of motion,Decreased strength,Decreased balance,Difficulty walking,Impaired flexibility  Visit Diagnosis: Chronic bilateral low back pain without sciatica  Muscle weakness (generalized)     Problem List Patient Active Problem List   Diagnosis Date Noted  . Cervical facet joint syndrome 04/08/2020  . Cervicalgia 04/08/2020  . Spinal cord stimulator status 12/11/2019  . History of 2019 novel coronavirus disease (COVID-19) 10/02/2019  . CKD (chronic kidney disease) stage 3, GFR 30-59 ml/min (HCC) 01/19/2019  . Acquired thrombophilia ([place]) 01/19/2019  . 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 Postlaminectomy syndrome, lumbar  region 01/16/2019  . History of fusion of lumbar spine (L2-L5) 01/16/2019  . Chronic radicular lumbar pain 01/16/2019  . HNP (herniated nucleus pulposus), lumbar 04/29/2018  . Advanced care planning/counseling discussion 09/28/2016  . Spinal stenosis, lumbar region, 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/13/2016  . Hyperlipidemia associated with type 2 diabetes mellitus ([place]) 07/14/2015  . Anemia 06/30/2015  . 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 OSA (obstructive sleep apnea) 03/23/2015  . Type 2 diabetes mellitus with diabetic chronic kidney disease ([place]) 01/05/2015  . Hypertension associated with diabetes ([place]) 09/28/2014  . 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[place]) 09/28/2014  . H/O prior ablation treatment 10/19/2011  . Paroxysmal atrial fibrillation (HCC) 10/19/2011    Phillips Grout PT, DPT, GCS  Qamar Rosman 05/25/2020, 4:29 PM  [facility] [address] [place], [place], [address] Phone: [phone]   Fax:  [phone]  Name: Sean Reyes. MRN: [id number] Date of Birth: 09-16-1940

## 2020-05-25 NOTE — Chronic Care Management (AMB) (Signed)
Chronic Care Management   CCM RN Visit Note  05/25/2020 Name: Sean Reyes. MRN: [id number] DOB: 05-16-1940  Subjective: Sean Reyes. is a 80 y.o. year old male who is a primary care patient of [name], [name], NP. The care management team was consulted for assistance with disease management and care coordination needs.    Engaged with patient by telephone for follow up visit in response to provider referral for case management and/or care coordination services.   Consent to Services:  The patient was given information about Chronic Care Management services, agreed to services, and gave verbal consent prior to initiation of services.  Please see initial visit note for detailed documentation.   Patient agreed to services and verbal consent obtained.   Assessment: Review of patient past medical history, allergies, medications, health status, including review of consultants reports, laboratory and other test data, was performed as part of comprehensive evaluation and provision of chronic care management services.   SDOH (Social Determinants of Health) assessments and interventions performed:    CCM Care Plan  Allergies  Allergen Reactions  . Levaquin [Levofloxacin In D5w] Anaphylaxis and Shortness Of Breath  . Shellfish Allergy Anaphylaxis    Has used duraprep, betadine and ioban in previous surgeries in 2019 and 2018 without issue  . Amiodarone Other (See Comments)    Tremors and thyroid toxicity  . Adhesive [Tape] Other (See Comments)    Little red bumps under the dressing.  He questions whether is latex related    Outpatient Encounter Medications as of 05/25/2020  Medication Sig  . gabapentin (NEURONTIN) 300 MG capsule Take 1 capsule (300 mg total) by mouth 2 (two) times daily.  [name] acetaminophen (TYLENOL) 500 MG tablet Take 500 mg by mouth every 6 (six) hours as needed (pain).  . AMLODIPINE BESYLATE PO Take 5 mg by mouth.  . benazepril (LOTENSIN) 40 MG tablet Take 1  tablet (40 mg total) by mouth daily.  [name] ELIQUIS 5 MG TABS tablet Take 1 tablet (5 mg total) by mouth 2 (two) times daily.  . finasteride (PROSCAR) 5 MG tablet Take 1 tablet (5 mg total) by mouth daily.  . fluticasone (FLONASE) 50 MCG/ACT nasal spray Place 2 sprays into both nostrils 2 (two) times daily.  . metFORMIN (GLUCOPHAGE-XR) 500 MG 24 hr tablet Take 2 tablets (1,000 mg total) by mouth 2 (two) times daily.  . metoprolol succinate (TOPROL-XL) 25 MG 24 hr tablet Take 1 tablet by mouth in the morning and at bedtime.   . pantoprazole (PROTONIX) 40 MG tablet Take 40 mg by mouth at bedtime.   . propafenone (RYTHMOL SR) 425 MG 12 hr capsule Take 425 mg by mouth 2 (two) times daily.  . rosuvastatin (CRESTOR) 5 MG tablet Take 1 tablet (5 mg total) by mouth daily.  . solifenacin (VESICARE) 5 MG tablet Take 5 mg by mouth daily.  . tamsulosin (FLOMAX) 0.4 MG CAPS capsule Take 0.8 mg by mouth daily.   No facility-administered encounter medications on file as of 05/25/2020.    Patient Active Problem List   Diagnosis Date Noted  . Cervical facet joint syndrome 04/08/2020  . Cervicalgia 04/08/2020  . Spinal cord stimulator status 12/11/2019  . History of 2019 novel coronavirus disease (COVID-19) 10/02/2019  . CKD (chronic kidney disease) stage 3, GFR 30-59 ml/min (HCC) 01/19/2019  . Acquired thrombophilia ([place]) 01/19/2019  . 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 Postlaminectomy syndrome, lumbar region 01/16/2019  . History of fusion of lumbar spine (L2-L5)  01/16/2019  . Chronic radicular lumbar pain 01/16/2019  . HNP (herniated nucleus pulposus), lumbar 04/29/2018  . Advanced care planning/counseling discussion 09/28/2016  . Spinal stenosis, lumbar region, 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/13/2016  . Hyperlipidemia associated with type 2 diabetes mellitus ([place]) 07/14/2015  . Anemia 06/30/2015  . 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 OSA (obstructive sleep apnea)  03/23/2015  . Type 2 diabetes mellitus with diabetic chronic kidney disease ([place]) 01/05/2015  . Hypertension associated with diabetes ([place]) 09/28/2014  . 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[place]) 09/28/2014  . H/O prior ablation treatment 10/19/2011  . Paroxysmal atrial fibrillation ([place]) 10/19/2011    Conditions to be addressed/monitored:HTN and chronic pain and urinary retention/bladder issues  Care Plan : RNCM: Hypertension (Adult)  Updates made by Vanita Ingles since 05/25/2020 12:00 AM    Problem: RNCM: Hypertension (Hypertension)   Priority: Medium    Goal: RNCM: Hypertension Monitored   Priority: Medium  Note:   Objective:  . Last practice recorded BP readings:  . BP Readings from Last 3 Encounters: .  05/25/20 . (!) 148/86 .  05/03/20 . 122/66 .  04/08/20 . (!) 144/63 .    [name] Most recent eGFR/CrCl: No results found for: EGFR  No components found for: CRCL Current Barriers:  [name] Knowledge Deficits related to basic understanding of hypertension pathophysiology and self care management . Knowledge Deficits related to understanding of medications prescribed for management of hypertension . Unable to independently manage HTN . Unable to perform IADLs independently- with back pain exacerbations sometimes he cannot do IADLs . Does not contact provider office for questions/concerns Case Manager Clinical Goal(s):  [name] Over the next 120 days, patient will verbalize understanding of plan for hypertension management . Over the next 120 days, patient will attend all scheduled medical appointments: 05-26-2020 . Over the next 120 days, patient will demonstrate improved adherence to prescribed treatment plan for hypertension as evidenced by taking all medications as prescribed, monitoring and recording blood pressure as directed, adhering to low sodium/DASH diet . Over the next 120 days, patient will demonstrate improved health management independence as evidenced by checking blood  pressure as directed and notifying PCP if SBP>160 or DBP > 90, taking all medications as prescribe, and adhering to a low sodium diet as discussed. . Over the next 120 days, patient will verbalize basic understanding of hypertension disease process and self health management plan as evidenced by compliance with heart healthy diet, compliance to medications, and working with the CCM team to maintain health and well being.  Interventions:  . Collaboration with [name], NP regarding development and update of comprehensive plan of care as evidenced by provider attestation and co-signature . Inter-disciplinary care team collaboration (see longitudinal plan of care) . Evaluation of current treatment plan related to hypertension self management and patient's adherence to plan as established by provider. 05-25-2020: The patient is having some concerns with low blood pressure. States sometimes he is having readings of 100/50 with light headedness and dizziness. Sometimes he does not take his medications or cuts it in half. Denies light headed or dizziness today. Has follow up with pcp on 05-26-2020: Encouraged the patient to discuss with the pcp about changes.  . Provided education to patient re: stroke prevention, s/s of heart attack and stroke, DASH diet, complications of uncontrolled blood pressure. 05-26-2020: Education on systolic range of 433-IR 518 and diastolic 60 to 90. Discussed safety when feeling light headed and dizziness.  . Reviewed medications with  patient and discussed importance of compliance. 05-25-2020: The patient sometimes has cut his medication in half or not taken due to low blood pressure.  . Discussed plans with patient for ongoing care management follow up and provided patient with direct contact information for care management team . Advised patient, providing education and rationale, to monitor blood pressure daily and record, calling PCP for findings outside established  parameters. 05-25-2020: The patient encouraged to write down questions to ask the pcp on visit tomorrow, especially parameters for taking medications  . Reviewed scheduled/upcoming provider appointments including: 05-26-2020 Patient Goals/Self-Care Activities . Over the next 120 days, patient will:  - Self administers medications as prescribed Attends all scheduled provider appointments Calls provider office for new concerns, questions, or BP outside discussed parameters Checks BP and records as discussed Follows a low sodium diet/DASH diet - blood pressure trends reviewed - depression screen reviewed - home or ambulatory blood pressure monitoring encouraged Follow Up Plan: Telephone follow up appointment with care management team member scheduled for: 08-03-2020 at 0900 am   Care Plan : RNCM: Chronic Pain (Adult)  Updates made by Vanita Ingles since 05/25/2020 12:00 AM    Problem: RNCM: Pain Management Plan (Chronic Pain)   Priority: High    Goal: RNCM: Pain Management Plan Developed   Priority: High  Note:   Current Barriers:  [name] Knowledge Deficits related to managing acute/chronic pain . Non-adherence to scheduled provider appointments . Non-adherence to prescribed medication regimen . Difficulty obtaining medications . Chronic Disease Management support and education needs related to chronic pain . Unable to independently manage pain exacerbations, has a pain stiumlator . Unable to perform IADLs independently . Does not contact provider office for questions/concerns  Nurse Case Manager Clinical Goal(s):  [name] Over the next 120 days, patient will verbalize understanding of plan for managing pain . Over the next 120 days, patient will attend all scheduled medical appointments: 05-26-2020 . Over the next  120  days patient will demonstrate use of different relaxation  skills and/or diversional activities to assist with pain reduction (distraction, imagery, relaxation, massage,  acupressure, TENS, heat, and cold application . Over the next  120   days patient will report pain at a level less than 3 to 4 on a 10-10 rating scale . Over the next  120  days patient will use pharmacological and nonpharmacological pain relief strategies . Over the next  120 days patient will verbalize acceptable level of pain relief and ability to engage in desired activities . Over the next 120  days patient will engage in desired activities without an increase in pain level  Interventions:  . Collaboration with [name], NP regarding development and update of comprehensive plan of care as evidenced by provider attestation and co-signature . Inter-disciplinary care team collaboration (see longitudinal plan of care) . - deep breathing, relaxation and mindfulness use promoted . - effectiveness of pharmacologic therapy monitored. 05-25-2020: The patient is scared to take pain medications because of the experience his daughter has had with the pain medications she has taken and is addicted to it. The patient says the gabapentin is not effective in helping with pain. He is unsure what to do and is waiting for xray results.  . - motivation and barriers to change assessed and addressed. 05-25-2020: The patient wants to feel better because he wants to be active and enjoy the things he likes to do.  . - pain assessed- 05-25-2020: The patient states his pain is worse.  He has had xrays and is waiting for the results. The stimulator may have become dislodged. Will discuss with the specialist. Will continue to monitor.  . - pain treatment goals reviewed. 05-25-2020: The patient wants his pain under control so he can do the things he likes to do . - participation in physical therapy encouraged . - premedication prior to activity encouraged . Evaluation of current treatment plan related to chronic pain and patient's adherence to plan as established by provider. 05-25-2020: The patient will see pcp tomorrow.  The patient is concerned as the pain stimulator is not working as well as he had hoped. He is hopeful the xrays will show what seems to be the issue.  . Advised patient to contact the pain specialist and discuss new onset of pain and see if the pain stimulator needs to be adjusted  . Provided education to patient re: review of recent activity and safety.  Patient verbalized current symptoms started on Sunday. He is having to use his cane at this time due to pain exacerbation.  . Reviewed medications with patient and discussed compliance . Discussed plans with patient for ongoing care management follow up and provided patient with direct contact information for care management team . Allow patient to maintain a diary of pain ratings, timing, precipitating events, medications, treatments, and what works best to relieve pain,  . Refer to support groups and self-help groups . Educate patient about the use of pharmacological interventions for pain management- antianxiety, antidepressants, NSAIDS, opioid analgesics,  . Explain the importance of lifestyle modifications to effective pain management   Patient Goals/Self Care Activities:  . Patient verbalizes understanding of plan to effectively manage pain and discomfort  . Self-administers medications as prescribed . Attends all scheduled provider appointments . Calls pharmacy for medication refills . Calls provider office for new concerns or questions . - mutually acceptable comfort goal set . - pain assessed . - pain treatment goals reviewed . - patient response to treatment assessed . - sharing of pain management plan with teachers and other caregivers encouraged  Follow Up Plan: Telephone follow up appointment with care management team member scheduled for: 08-03-2020 at 0900 am     Care Plan : RNCM: Urinary and Bladder Issues  Updates made by Vanita Ingles since 05/25/2020 12:00 AM    Problem: RNCM: Urinary and Bladder issuse   Priority:  High    Goal: RNCM: Urinary and Bladder Concerns Completed 05/25/2020  Note:   Current Barriers: Goal met- patient is not having to self cath any longer. Will continue to monitor.  . Knowledge Deficits related to changes in bladder health and concerns with urgency and not being able to control patterns . 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 Unable to independently manage bladder health with issues of urgency and at times unable to void  . Does not contact provider office for questions/concerns  Nurse Case Manager Clinical Goal(s):  [name] Over the next 120 days, patient will verbalize understanding of plan for effective management of bladder health . Over the next 120 days, patient will work with [facility] and pcp to address needs related to changes in bladder patterns and assistance with care  . Over the next 120 days, patient will demonstrate a decrease in urgency  exacerbations as evidenced by normalized bladder emptying schedule and normal bladder patterns . Over the next 120 days, patient will attend all scheduled medical appointments:  05-26-2020 . Over the next 120 days, patient will demonstrate improved adherence to prescribed treatment plan for bladder urgency/inability to void  as evidenced bycalling the office for changes, medications compliance, no evidence of UTI's or other infections   Interventions:  . 1:1 collaboration with [name], NP regarding development and update of comprehensive plan of care as evidenced by provider attestation and co-signature . Inter-disciplinary care team collaboration (see longitudinal plan of care) . Evaluation of current treatment plan related to bladder health and wellness  and patient's adherence to plan as established by provider. . Advised patient to call the office for changes in bladder health or questions  . Provided education to patient re: safety,  preventing infection, talking to provider for worsening condtions  . Reviewed medications with patient and discussed compliance . Reviewed scheduled/upcoming provider appointments including: 05-26-2020 . Discussed plans with patient for ongoing care management follow up and provided patient with direct contact information for care management team  Patient Goals/Self-Care Activities Over the next 120 days, patient will:  - Patient will self administer medications as prescribed Patient will attend all scheduled provider appointments Patient will call pharmacy for medication refills Patient will attend church or other social activities Patient will continue to perform IADL's independently Patient will call provider office for new concerns or questions Patient will work with BSW to address care coordination needs and will continue to work with the clinical team to address health care and disease management related needs.   - education plan reviewed and/or amended - health literacy screen reviewed - patient's preferred learning methods utilized - privacy ensured - questions encouraged - readiness to learn monitored  Follow Up Plan: Telephone follow up appointment with care management team member scheduled for: 06-01-2020 at 0900 am       Task: RNCM: Bladder health Education Completed 05/25/2020  Outcome: Positive  Note:   Care Management Activities:    - education plan reviewed and/or amended - health literacy screen reviewed - patient's preferred learning methods utilized - privacy ensured - questions encouraged - readiness to learn monitored         Plan:Telephone follow up appointment with care management team member scheduled for:  08-03-2020 at 0900 am   [place], MSN, [facility] Mobile: [phone]

## 2020-05-25 NOTE — Patient Instructions (Signed)
Visit Information  PATIENT GOALS: Goals Addressed            This Visit's Progress   . COMPLETED: Increase water intake       Recommend drinking at least 3-4 glasses of water a day.    . COMPLETED: Patient Stated       09/08/2019, to get back better and stop self catherizations Is not having to self cath at this time.       Patient Care Plan: RNCM: Hypertension (Adult)    Problem Identified: RNCM: Hypertension (Hypertension)   Priority: Medium    Goal: RNCM: Hypertension Monitored   Priority: Medium  Note:   Objective:  . Last practice recorded BP readings:  . BP Readings from Last 3 Encounters: .  05/25/20 . (!) 148/86 .  05/03/20 . 122/66 .  04/08/20 . (!) 144/63 .    [name] Most recent eGFR/CrCl: No results found for: EGFR  No components found for: CRCL Current Barriers:  [name] Knowledge Deficits related to basic understanding of hypertension pathophysiology and self care management . Knowledge Deficits related to understanding of medications prescribed for management of hypertension . Unable to independently manage HTN . Unable to perform IADLs independently- with back pain exacerbations sometimes he cannot do IADLs . Does not contact provider office for questions/concerns Case Manager Clinical Goal(s):  [name] Over the next 120 days, patient will verbalize understanding of plan for hypertension management . Over the next 120 days, patient will attend all scheduled medical appointments: 05-26-2020 . Over the next 120 days, patient will demonstrate improved adherence to prescribed treatment plan for hypertension as evidenced by taking all medications as prescribed, monitoring and recording blood pressure as directed, adhering to low sodium/DASH diet . Over the next 120 days, patient will demonstrate improved health management independence as evidenced by checking blood pressure as directed and notifying PCP if SBP>160 or DBP > 90, taking all medications as prescribe, and adhering to a  low sodium diet as discussed. . Over the next 120 days, patient will verbalize basic understanding of hypertension disease process and self health management plan as evidenced by compliance with heart healthy diet, compliance to medications, and working with the CCM team to maintain health and well being.  Interventions:  . Collaboration with [name], NP regarding development and update of comprehensive plan of care as evidenced by provider attestation and co-signature . Inter-disciplinary care team collaboration (see longitudinal plan of care) . Evaluation of current treatment plan related to hypertension self management and patient's adherence to plan as established by provider. 05-25-2020: The patient is having some concerns with low blood pressure. States sometimes he is having readings of 100/50 with light headedness and dizziness. Sometimes he does not take his medications or cuts it in half. Denies light headed or dizziness today. Has follow up with pcp on 05-26-2020: Encouraged the patient to discuss with the pcp about changes.  . Provided education to patient re: stroke prevention, s/s of heart attack and stroke, DASH diet, complications of uncontrolled blood pressure. 05-26-2020: Education on systolic range of [id number] 440 and diastolic 60 to 90. Discussed safety when feeling light headed and dizziness.  . Reviewed medications with patient and discussed importance of compliance. 05-25-2020: The patient sometimes has cut his medication in half or not taken due to low blood pressure.  . Discussed plans with patient for ongoing care management follow up and provided patient with direct contact information for care management team . Advised patient, providing education and  rationale, to monitor blood pressure daily and record, calling PCP for findings outside established parameters. 05-25-2020: The patient encouraged to write down questions to ask the pcp on visit tomorrow, especially parameters  for taking medications  . Reviewed scheduled/upcoming provider appointments including: 05-26-2020 Patient Goals/Self-Care Activities . Over the next 120 days, patient will:  - Self administers medications as prescribed Attends all scheduled provider appointments Calls provider office for new concerns, questions, or BP outside discussed parameters Checks BP and records as discussed Follows a low sodium diet/DASH diet - blood pressure trends reviewed - depression screen reviewed - home or ambulatory blood pressure monitoring encouraged Follow Up Plan: Telephone follow up appointment with care management team member scheduled for: 08-03-2020 at 0900 am   Task: RNCM: Identify and Monitor Blood Pressure Elevation   Note:   Care Management Activities:    - blood pressure trends reviewed - depression screen reviewed - home or ambulatory blood pressure monitoring encouraged      Patient Care Plan: RNCM: Chronic Pain (Adult)    Problem Identified: RNCM: Pain Management Plan (Chronic Pain)   Priority: High    Goal: RNCM: Pain Management Plan Developed   Priority: High  Note:   Current Barriers:  [name] Knowledge Deficits related to managing acute/chronic pain . Non-adherence to scheduled provider appointments . Non-adherence to prescribed medication regimen . Difficulty obtaining medications . Chronic Disease Management support and education needs related to chronic pain . Unable to independently manage pain exacerbations, has a pain stiumlator . Unable to perform IADLs independently . Does not contact provider office for questions/concerns  Nurse Case Manager Clinical Goal(s):  [name] Over the next 120 days, patient will verbalize understanding of plan for managing pain . Over the next 120 days, patient will attend all scheduled medical appointments: 05-26-2020 . Over the next  120  days patient will demonstrate use of different relaxation  skills and/or diversional activities to assist with  pain reduction (distraction, imagery, relaxation, massage, acupressure, TENS, heat, and cold application . Over the next  120   days patient will report pain at a level less than 3 to 4 on a 10-10 rating scale . Over the next  120  days patient will use pharmacological and nonpharmacological pain relief strategies . Over the next  120 days patient will verbalize acceptable level of pain relief and ability to engage in desired activities . Over the next 120  days patient will engage in desired activities without an increase in pain level  Interventions:  . Collaboration with [name], NP regarding development and update of comprehensive plan of care as evidenced by provider attestation and co-signature . Inter-disciplinary care team collaboration (see longitudinal plan of care) . - deep breathing, relaxation and mindfulness use promoted . - effectiveness of pharmacologic therapy monitored. 05-25-2020: The patient is scared to take pain medications because of the experience his daughter has had with the pain medications she has taken and is addicted to it. The patient says the gabapentin is not effective in helping with pain. He is unsure what to do and is waiting for xray results.  . - motivation and barriers to change assessed and addressed. 05-25-2020: The patient wants to feel better because he wants to be active and enjoy the things he likes to do.  . - pain assessed- 05-25-2020: The patient states his pain is worse. He has had xrays and is waiting for the results. The stimulator may have become dislodged. Will discuss with the specialist. Will  continue to monitor.  . - pain treatment goals reviewed. 05-25-2020: The patient wants his pain under control so he can do the things he likes to do . - participation in physical therapy encouraged . - premedication prior to activity encouraged . Evaluation of current treatment plan related to chronic pain and patient's adherence to plan as established  by provider. 05-25-2020: The patient will see pcp tomorrow. The patient is concerned as the pain stimulator is not working as well as he had hoped. He is hopeful the xrays will show what seems to be the issue.  . Advised patient to contact the pain specialist and discuss new onset of pain and see if the pain stimulator needs to be adjusted  . Provided education to patient re: review of recent activity and safety.  Patient verbalized current symptoms started on Sunday. He is having to use his cane at this time due to pain exacerbation.  . Reviewed medications with patient and discussed compliance . Discussed plans with patient for ongoing care management follow up and provided patient with direct contact information for care management team . Allow patient to maintain a diary of pain ratings, timing, precipitating events, medications, treatments, and what works best to relieve pain,  . Refer to support groups and self-help groups . Educate patient about the use of pharmacological interventions for pain management- antianxiety, antidepressants, NSAIDS, opioid analgesics,  . Explain the importance of lifestyle modifications to effective pain management   Patient Goals/Self Care Activities:  . Patient verbalizes understanding of plan to effectively manage pain and discomfort  . Self-administers medications as prescribed . Attends all scheduled provider appointments . Calls pharmacy for medication refills . Calls provider office for new concerns or questions . - mutually acceptable comfort goal set . - pain assessed . - pain treatment goals reviewed . - patient response to treatment assessed . - sharing of pain management plan with teachers and other caregivers encouraged  Follow Up Plan: Telephone follow up appointment with care management team member scheduled for: 08-03-2020 at 0900 am     Task: RNCM: Partner to Develop Chronic Pain Management Plan   Note:   Care Management Activities:    -  mutually acceptable comfort goal set - pain assessed - pain treatment goals reviewed - patient response to treatment assessed - sharing of pain management plan with teachers and other caregivers encouraged    Notes: patient has a pain stimulator    Patient Care Plan: RNCM: Urinary and Bladder Issues    Problem Identified: RNCM: Urinary and Bladder issuse   Priority: High    Goal: RNCM: Urinary and Bladder Concerns Completed 05/25/2020  Note:   Current Barriers: Goal met- patient is not having to self cath any longer. Will continue to monitor.  . Knowledge Deficits related to changes in bladder health and concerns with urgency and not being able to control patterns . 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 Unable to independently manage bladder health with issues of urgency and at times unable to void  . Does not contact provider office for questions/concerns  Nurse Case Manager Clinical Goal(s):  [name] Over the next 120 days, patient will verbalize understanding of plan for effective management of bladder health . Over the next 120 days, patient will work with [facility] and pcp to address needs related to changes in bladder patterns and assistance with care  . Over the next 120 days, patient  will demonstrate a decrease in urgency  exacerbations as evidenced by normalized bladder emptying schedule and normal bladder patterns . Over the next 120 days, patient will attend all scheduled medical appointments: 05-26-2020 . Over the next 120 days, patient will demonstrate improved adherence to prescribed treatment plan for bladder urgency/inability to void  as evidenced bycalling the office for changes, medications compliance, no evidence of UTI's or other infections   Interventions:  . 1:1 collaboration with [name], NP regarding development and update of comprehensive plan of care as evidenced by provider  attestation and co-signature . Inter-disciplinary care team collaboration (see longitudinal plan of care) . Evaluation of current treatment plan related to bladder health and wellness  and patient's adherence to plan as established by provider. . Advised patient to call the office for changes in bladder health or questions  . Provided education to patient re: safety, preventing infection, talking to provider for worsening condtions  . Reviewed medications with patient and discussed compliance . Reviewed scheduled/upcoming provider appointments including: 05-26-2020 . Discussed plans with patient for ongoing care management follow up and provided patient with direct contact information for care management team  Patient Goals/Self-Care Activities Over the next 120 days, patient will:  - Patient will self administer medications as prescribed Patient will attend all scheduled provider appointments Patient will call pharmacy for medication refills Patient will attend church or other social activities Patient will continue to perform IADL's independently Patient will call provider office for new concerns or questions Patient will work with BSW to address care coordination needs and will continue to work with the clinical team to address health care and disease management related needs.   - education plan reviewed and/or amended - health literacy screen reviewed - patient's preferred learning methods utilized - privacy ensured - questions encouraged - readiness to learn monitored  Follow Up Plan: Telephone follow up appointment with care management team member scheduled for: 06-01-2020 at 0900 am       Task: RNCM: Bladder health Education Completed 05/25/2020  Outcome: Positive  Note:   Care Management Activities:    - education plan reviewed and/or amended - health literacy screen reviewed - patient's preferred learning methods utilized - privacy ensured - questions encouraged -  readiness to learn monitored         Patient verbalizes understanding of instructions provided today and agrees to view in [place].   Telephone follow up appointment with care management team member scheduled for: 08-03-2020 at 0900 am  Noreene Larsson RN, MSN, [facility] Mobile: [phone]   Orthostatic Hypotension Blood pressure is a measurement of how strongly, or weakly, your blood is pressing against the walls of your arteries. Orthostatic hypotension is a sudden drop in blood pressure that happens when you quickly change positions, such as when you get up from sitting or lying down. Arteries are blood vessels that carry blood from your heart throughout your body. When blood pressure is too low, you may not get enough blood to your brain or to the rest of your organs. This can cause weakness, light-headedness, rapid heartbeat, and fainting. This can last for just a few seconds or for up to a few minutes. Orthostatic hypotension is usually not a serious problem. However, if it happens frequently or gets worse, it may be a sign of something more serious. What are the causes? This condition may be caused by:  Sudden changes in posture, such as  standing up quickly after you have been sitting or lying down.  Blood loss.  Loss of body fluids (dehydration).  Heart problems.  Hormone (endocrine) problems.  Pregnancy.  Severe infection.  Lack of certain nutrients.  Severe allergic reactions (anaphylaxis).  Certain medicines, such as blood pressure medicine or medicines that make the body lose excess fluids (diuretics). Sometimes, this condition can be caused by not taking medicine as directed, such as taking too much of a certain medicine. What increases the risk? The following factors may make you more likely to develop this condition:  Age. Risk increases as you get older.  Conditions that  affect the heart or the central nervous system.  Taking certain medicines, such as blood pressure medicine or diuretics.  Being pregnant. What are the signs or symptoms? Symptoms of this condition may include:  Weakness.  Light-headedness.  Dizziness.  Blurred vision.  Fatigue.  Rapid heartbeat.  Fainting, in severe cases. How is this diagnosed? This condition is diagnosed based on:  Your medical history.  Your symptoms.  Your blood pressure measurement. Your health care provider will check your blood pressure when you are: ? Lying down. ? Sitting. ? Standing. A blood pressure reading is recorded as two numbers, such as "120 over 80" (or 120/80). The first ("top") number is called the systolic pressure. It is a measure of the pressure in your arteries as your heart beats. The second ("bottom") number is called the diastolic pressure. It is a measure of the pressure in your arteries when your heart relaxes between beats. Blood pressure is measured in a unit called mm Hg. Healthy blood pressure for most adults is 120/80. If your blood pressure is below 90/60, you may be diagnosed with hypotension. Other information or tests that may be used to diagnose orthostatic hypotension include:  Your other vital signs, such as your heart rate and temperature.  Blood tests.  Tilt table test. For this test, you will be safely secured to a table that moves you from a lying position to an upright position. Your heart rhythm and blood pressure will be monitored during the test. How is this treated? This condition may be treated by:  Changing your diet. This may involve eating more salt (sodium) or drinking more water.  Taking medicines to raise your blood pressure.  Changing the dosage of certain medicines you are taking that might be lowering your blood pressure.  Wearing compression stockings. These stockings help to prevent blood clots and reduce swelling in your legs. In some  cases, you may need to go to the hospital for:  Fluid replacement. This means you will receive fluids through an IV.  Blood replacement. This means you will receive donated blood through an IV (transfusion).  Treating an infection or heart problems, if this applies.  Monitoring. You may need to be monitored while medicines that you are taking wear off. Follow these instructions at home: Eating and drinking  Drink enough fluid to keep your urine pale yellow.  Eat a healthy diet, and follow instructions from your health care provider about eating or drinking restrictions. A healthy diet includes: ? Fresh fruits and vegetables. ? Whole grains. ? Lean meats. ? Low-fat dairy products.  Eat extra salt only as directed. Do not add extra salt to your diet unless your health care provider told you to do that.  Eat frequent, small meals.  Avoid standing up suddenly after eating.   Medicines  Take over-the-counter and prescription medicines only as told  by your health care provider. ? Follow instructions from your health care provider about changing the dosage of your current medicines, if this applies. ? Do not stop or adjust any of your medicines on your own. General instructions  Wear compression stockings as told by your health care provider.  Get up slowly from lying down or sitting positions. This gives your blood pressure a chance to adjust.  Avoid hot showers and excessive heat as directed by your health care provider.  Return to your normal activities as told by your health care provider. Ask your health care provider what activities are safe for you.  Do not use any products that contain nicotine or tobacco, such as cigarettes, e-cigarettes, and chewing tobacco. If you need help quitting, ask your health care provider.  Keep all follow-up visits as told by your health care provider. This is important.   Contact a health care provider if you:  Vomit.  Have  diarrhea.  Have a fever for more than 2-3 days.  Feel more thirsty than usual.  Feel weak and tired. Get help right away if you:  Have chest pain.  Have a fast or irregular heartbeat.  Develop numbness in any part of your body.  Cannot move your arms or your legs.  Have trouble speaking.  Become sweaty or feel light-headed.  Faint.  Feel short of breath.  Have trouble staying awake.  Feel confused. Summary  Orthostatic hypotension is a sudden drop in blood pressure that happens when you quickly change positions.  Orthostatic hypotension is usually not a serious problem.  It is diagnosed by having your blood pressure taken lying down, sitting, and then standing.  It may be treated by changing your diet or adjusting your medicines. This information is not intended to replace advice given to you by your health care provider. Make sure you discuss any questions you have with your health care provider. Document Revised: 07/26/2017 Document Reviewed: 07/26/2017 Elsevier Patient Education  2021 [place].  Hypotension As your heart beats, it forces blood through your body. This force is called blood pressure. If you have hypotension, you have low blood pressure. When your blood pressure is too low, you may not get enough blood to your brain or other parts of your body. This may cause you to feel weak, light-headed, have a fast heartbeat, or even pass out (faint). Low blood pressure may be harmless, or it may cause serious problems. What are the causes?  Blood loss.  Not enough water in the body (dehydration).  Heart problems.  Hormone problems.  Pregnancy.  A very bad infection.  Not having enough of certain nutrients.  Very bad allergic reactions.  Certain medicines. What increases the risk?  Age. The risk increases as you get older.  Conditions that affect the heart or the brain and spinal cord (central nervous system).  Taking certain  medicines.  Being pregnant. What are the signs or symptoms?  Feeling: ? Weak. ? Light-headed. ? Dizzy. ? Tired (fatigued).  Blurred vision.  Fast heartbeat.  Passing out, in very bad cases. How is this treated?  Changing your diet. This may involve eating more salt (sodium) or drinking more water.  Taking medicines to raise your blood pressure.  Changing how much you take (the dosage) of some of your medicines.  Wearing compression stockings. These stockings help to prevent blood clots and reduce swelling in your legs. In some cases, you may need to go to the hospital for:  Fluid  replacement. This means you will receive fluids through an IV tube.  Blood replacement. This means you will receive donated blood through an IV tube (transfusion).  Treating an infection or heart problems, if this applies.  Monitoring. You may need to be monitored while medicines that you are taking wear off. Follow these instructions at home: Eating and drinking  Drink enough fluids to keep your pee (urine) pale yellow.  Eat a healthy diet. Follow instructions from your doctor about what you can eat or drink. A healthy diet includes: ? Fresh fruits and vegetables. ? Whole grains. ? Low-fat (lean) meats. ? Low-fat dairy products.  Eat extra salt only as told. Do not add extra salt to your diet unless your doctor tells you to.  Eat small meals often.  Avoid standing up quickly after you eat.   Medicines  Take over-the-counter and prescription medicines only as told by your doctor. ? Follow instructions from your doctor about changing how much you take of your medicines, if this applies. ? Do not stop or change any of your medicines on your own. General instructions  Wear compression stockings as told by your doctor.  Get up slowly from lying down or sitting.  Avoid hot showers and a lot of heat as told by your doctor.  Return to your normal activities as told by your doctor. Ask  what activities are safe for you.  Do not use any products that contain nicotine or tobacco, such as cigarettes, e-cigarettes, and chewing tobacco. If you need help quitting, ask your doctor.  Keep all follow-up visits as told by your doctor. This is important.   Contact a doctor if:  You throw up (vomit).  You have watery poop (diarrhea).  You have a fever for more than 2-3 days.  You feel more thirsty than normal.  You feel weak and tired. Get help right away if:  You have chest pain.  You have a fast or uneven heartbeat.  You lose feeling (have numbness) in any part of your body.  You cannot move your arms or your legs.  You have trouble talking.  You get sweaty or feel light-headed.  You pass out.  You have trouble breathing.  You have trouble staying awake.  You feel mixed up (confused). Summary  Hypotension is also called low blood pressure. It is when the force of blood pumping through your arteries is too weak.  Hypotension may be harmless, or it may cause serious problems.  Treatment may include changing your diet and medicines, and wearing compression stockings.  In very bad cases, you may need to go to the hospital. This information is not intended to replace advice given to you by your health care provider. Make sure you discuss any questions you have with your health care provider. Document Revised: 07/26/2017 Document Reviewed: 07/26/2017 Elsevier Patient Education  2021 [place].  [email or web address]">  DASH Eating Plan DASH stands for Dietary Approaches to Stop Hypertension. The DASH eating plan is a healthy eating plan that has been shown to:  Reduce high blood pressure (hypertension).  Reduce your risk for type 2 diabetes, heart disease, and stroke.  Help with weight loss. What are tips for following this plan? Reading food labels  Check food labels for the amount of salt (sodium) per  serving. Choose foods with less than 5 percent of the Daily Value of sodium. Generally, foods with less than 300 milligrams (mg) of sodium per serving fit into this eating plan.  To  find whole grains, look for the word "whole" as the first word in the ingredient list. Shopping  Buy products labeled as "low-sodium" or "no salt added."  Buy fresh foods. Avoid canned foods and pre-made or frozen meals. Cooking  Avoid adding salt when cooking. Use salt-free seasonings or herbs instead of table salt or sea salt. Check with your health care provider or pharmacist before using salt substitutes.  Do not fry foods. Cook foods using healthy methods such as baking, boiling, grilling, roasting, and broiling instead.  Cook with heart-healthy oils, such as olive, canola, avocado, soybean, or sunflower oil. Meal planning  Eat a balanced diet that includes: ? 4 or more servings of fruits and 4 or more servings of vegetables each day. Try to fill one-half of your plate with fruits and vegetables. ? 6-8 servings of whole grains each day. ? Less than 6 oz (170 g) of lean meat, poultry, or fish each day. A 3-oz (85-g) serving of meat is about the same size as a deck of cards. One egg equals 1 oz (28 g). ? 2-3 servings of low-fat dairy each day. One serving is 1 cup (237 mL). ? 1 serving of nuts, seeds, or beans 5 times each week. ? 2-3 servings of heart-healthy fats. Healthy fats called omega-3 fatty acids are found in foods such as walnuts, flaxseeds, fortified milks, and eggs. These fats are also found in cold-water fish, such as sardines, salmon, and mackerel.  Limit how much you eat of: ? Canned or prepackaged foods. ? Food that is high in trans fat, such as some fried foods. ? Food that is high in saturated fat, such as fatty meat. ? Desserts and other sweets, sugary drinks, and other foods with added sugar. ? Full-fat dairy products.  Do not salt foods before eating.  Do not eat more than 4 egg  yolks a week.  Try to eat at least 2 vegetarian meals a week.  Eat more home-cooked food and less restaurant, buffet, and fast food.   Lifestyle  When eating at a restaurant, ask that your food be prepared with less salt or no salt, if possible.  If you drink alcohol: ? Limit how much you use to:  0-1 drink a day for women who are not pregnant.  0-2 drinks a day for men. ? Be aware of how much alcohol is in your drink. In the [place] one drink equals one 12 oz bottle of beer (355 mL), one 5 oz glass of wine (148 mL), or one 1 oz glass of hard liquor (44 mL). General information  Avoid eating more than 2,300 mg of salt a day. If you have hypertension, you may need to reduce your sodium intake to 1,500 mg a day.  Work with your health care provider to maintain a healthy body weight or to lose weight. Ask what an ideal weight is for you.  Get at least 30 minutes of exercise that causes your heart to beat faster (aerobic exercise) most days of the week. Activities may include walking, swimming, or biking.  Work with your health care provider or dietitian to adjust your eating plan to your individual calorie needs. What foods should I eat? Fruits All fresh, dried, or frozen fruit. Canned fruit in natural juice (without added sugar). Vegetables Fresh or frozen vegetables (raw, steamed, roasted, or grilled). Low-sodium or reduced-sodium tomato and vegetable juice. Low-sodium or reduced-sodium tomato sauce and tomato paste. Low-sodium or reduced-sodium canned vegetables. Grains Whole-grain or whole-wheat bread.  Whole-grain or whole-wheat pasta. Brown rice. [place]. Bulgur. Whole-grain and low-sodium cereals. Pita bread. Low-fat, low-sodium crackers. Whole-wheat flour tortillas. Meats and other proteins Skinless chicken or [place]. Ground chicken or [place]. Pork with fat trimmed off. Fish and seafood. Egg whites. Dried beans, peas, or lentils. Unsalted nuts, nut butters, and seeds.  Unsalted canned beans. Lean cuts of beef with fat trimmed off. Low-sodium, lean precooked or cured meat, such as sausages or meat loaves. Dairy Low-fat (1%) or fat-free (skim) milk. Reduced-fat, low-fat, or fat-free cheeses. Nonfat, low-sodium ricotta or cottage cheese. Low-fat or nonfat yogurt. Low-fat, low-sodium cheese. Fats and oils Soft margarine without trans fats. Vegetable oil. Reduced-fat, low-fat, or light mayonnaise and salad dressings (reduced-sodium). Canola, safflower, olive, avocado, soybean, and sunflower oils. Avocado. Seasonings and condiments Herbs. Spices. Seasoning mixes without salt. Other foods Unsalted popcorn and pretzels. Fat-free sweets. The items listed above may not be a complete list of foods and beverages you can eat. Contact a dietitian for more information. What foods should I avoid? Fruits Canned fruit in a light or heavy syrup. Fried fruit. Fruit in cream or butter sauce. Vegetables Creamed or fried vegetables. Vegetables in a cheese sauce. Regular canned vegetables (not low-sodium or reduced-sodium). Regular canned tomato sauce and paste (not low-sodium or reduced-sodium). Regular tomato and vegetable juice (not low-sodium or reduced-sodium). Angie Fava. Olives. Grains Baked goods made with fat, such as croissants, muffins, or some breads. Dry pasta or rice meal packs. Meats and other proteins Fatty cuts of meat. Ribs. Fried meat. Berniece Salines. Bologna, salami, and other precooked or cured meats, such as sausages or meat loaves. Fat from the back of a pig (fatback). Bratwurst. Salted nuts and seeds. Canned beans with added salt. Canned or smoked fish. Whole eggs or egg yolks. Chicken or [place] with skin. Dairy Whole or 2% milk, cream, and half-and-half. Whole or full-fat cream cheese. Whole-fat or sweetened yogurt. Full-fat cheese. Nondairy creamers. Whipped toppings. Processed cheese and cheese spreads. Fats and oils Butter. Stick margarine. Lard. Shortening. Ghee.  Bacon fat. Tropical oils, such as coconut, palm kernel, or palm oil. Seasonings and condiments Onion salt, garlic salt, seasoned salt, table salt, and sea salt. Worcestershire sauce. Tartar sauce. Barbecue sauce. Teriyaki sauce. Soy sauce, including reduced-sodium. Steak sauce. Canned and packaged gravies. Fish sauce. Oyster sauce. Cocktail sauce. Store-bought horseradish. Ketchup. Mustard. Meat flavorings and tenderizers. Bouillon cubes. Hot sauces. Pre-made or packaged marinades. Pre-made or packaged taco seasonings. Relishes. Regular salad dressings. Other foods Salted popcorn and pretzels. The items listed above may not be a complete list of foods and beverages you should avoid. Contact a dietitian for more information. Where to find more information  National Heart, Lung, and Blood Institute: https://wilson-eaton.com/  American Heart Association: www.heart.org  Academy of Nutrition and Dietetics: www.eatright.[place]: www.kidney.org Summary  The DASH eating plan is a healthy eating plan that has been shown to reduce high blood pressure (hypertension). It may also reduce your risk for type 2 diabetes, heart disease, and stroke.  When on the DASH eating plan, aim to eat more fresh fruits and vegetables, whole grains, lean proteins, low-fat dairy, and heart-healthy fats.  With the DASH eating plan, you should limit salt (sodium) intake to 2,300 mg a day. If you have hypertension, you may need to reduce your sodium intake to 1,500 mg a day.  Work with your health care provider or dietitian to adjust your eating plan to your individual calorie needs. This information is not intended to replace advice  given to you by your health care provider. Make sure you discuss any questions you have with your health care provider. Document Revised: 01/03/2019 Document Reviewed: 01/03/2019 Elsevier Patient Education  2021 [employer].

## 2020-05-26 ENCOUNTER — Ambulatory Visit: Payer: Medicare Other | Admitting: Nurse Practitioner

## 2020-05-31 ENCOUNTER — Ambulatory Visit: Payer: Medicare Other

## 2020-05-31 ENCOUNTER — Other Ambulatory Visit: Payer: Self-pay

## 2020-05-31 DIAGNOSIS — M545 Low back pain, unspecified: Secondary | ICD-10-CM | POA: Diagnosis not present

## 2020-05-31 DIAGNOSIS — G8929 Other chronic pain: Secondary | ICD-10-CM

## 2020-05-31 DIAGNOSIS — M6281 Muscle weakness (generalized): Secondary | ICD-10-CM

## 2020-05-31 NOTE — Therapy (Signed)
[facility] [address] [place], [place], [address] Phone: [phone]   Fax:  [phone]  Physical Therapy Treatment  Patient Details  Name: Sean Reyes. MRN: [id number] Date of Birth: 28-Jun-1940 Referring Provider (PT): Gillis Santa, MD   Encounter Date: 05/31/2020   PT End of Session - 06/01/20 1420    Visit Number 26    Number of Visits 65    Date for PT Re-Evaluation 07/20/20    Authorization Type eval: 01/01/20    PT Start Time 1630    PT Stop Time 1700    PT Time Calculation (min) 30 min    Activity Tolerance Patient tolerated treatment well    Behavior During Therapy [facility] for tasks assessed/performed           Past Medical History:  Diagnosis Date  . Anemia   . Anxiety   . Arthritis   . Atrial fibrillation ([place])   . Cataracts, bilateral   . Complication of anesthesia    pt reports low BP's after surgery at [facility] and difficulty awakening  . Depression   . Diabetes ([place])    dx 6-8 yrs ago  . Dysrhythmia    a-fib  . GERD (gastroesophageal reflux disease)    OCC TAKES ALKA SELTZER  . History of kidney stones    10-15 yrs ago  . 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 Hyperlipidemia   . Hypertension   . Nocturia   . S/P ablation of atrial fibrillation    Ablative therapy  . Sleep apnea    CPAP   . Tachycardia, unspecified     Past Surgical History:  Procedure Laterality Date  . ABLATION    . ANTERIOR LAT LUMBAR FUSION N/A 06/27/2017   Procedure: Anterior Lateral Lumbar Interbody  Fusion - Lumbar Two-Lumbar Three - Lumbar Three-Lumbar Four, Posterior Lumbar Interbody Fusion Lumbar Four- Five;  Surgeon: Kary Kos, MD;  Location: [facility] [place];  Service: Neurosurgery;  Laterality: N/A;  Anterior Lateral Lumbar Interbody  Fusion - Lumbar Two-Lumbar Three - Lumbar Three-Lumbar Four, Posterior Lumbar Interbody Fusion Lumbar Four- Five  . BACK SURGERY    . CARDIOVERSION N/A 08/29/2018    Procedure: CARDIOVERSION;  Surgeon: Corey Skains, MD;  Location: [facility] OR[facility];  Service: Cardiovascular;  Laterality: N/A;  . CARDIOVERSION N/A 09/24/2018   Procedure: CARDIOVERSION;  Surgeon: Corey Skains, MD;  Location: [facility] ORS;  Service: Cardiovascular;  Laterality: N/A;  . COLONOSCOPY WITH PROPOFOL N/A 10/05/2015   Procedure: COLONOSCOPY WITH PROPOFOL;  Surgeon: Lollie Sails, MD;  Location: [facility] ENDOSCOPY;  Service: Endoscopy;  Laterality: N/A;  . ESOPHAGOGASTRODUODENOSCOPY (EGD) WITH PROPOFOL N/A 04/01/2018   Procedure: ESOPHAGOGASTRODUODENOSCOPY (EGD) WITH PROPOFOL;  Surgeon: Lollie Sails, MD;  Location: [facility] ENDOSCOPY;  Service: Endoscopy;  Laterality: N/A;  . HERNIA REPAIR    . JOINT REPLACEMENT Bilateral    hips  RT+  LEFT X2   . LUMBAR LAMINECTOMY/DECOMPRESSION MICRODISCECTOMY Left 09/13/2016   Procedure: Microdiscectomy - Lumbar two-three,  Lumbar three- - left;  Surgeon: Kary Kos, MD;  Location: [facility];  Service: Neurosurgery;  Laterality: Left;  . SPINAL CORD STIMULATOR INSERTION  07/08/2019  . TONSILLECTOMY      There were no vitals filed for this visit.   Subjective Assessment - 05/31/20 1633    Subjective Pt reports that he is doing well today. He states that his back pain is actually doing pretty good today but his neck  is really hurtings. States that his wife had to bring him today because he is having such difficulty turning his neck that he didn't feel safe driving. No specific questions upon arrival.    Pertinent History Pt is a 80 y.o. male referred to PT for LBP. Pt has extensive PMH of failed back surgical syndrome, postlaminectomy syndrome, chronic radicular pain, hx of lumbar fusion spine, spinal cord stimulator, paroxysmal A-fib, DM-Type 2, HTN, chronic kidney disease. Pt reports 4 fusions on lumbar spine due to protruding disks which was ~2 years ago. Pt reports that his spine stimulator has started to help some but his biggest issue is his decreased  strength in his spine, legs, and core. In sitting pt has 0/10 NPS. With household distance pt has 4-5/10 NPS but is instantly relieved with sitting. With long walks, from the clinic to the car (100 yds)  8-9/10 NPS. The pain is described as dull pain. Pt denies radicular symptoms. [name] LBP is located acros his belt line right above his buttocks. Pt reports difficulty with his balance and asc/desc stairs but at home he uses no AD at home. Pt does report a fall 1.5 months ago with his cane and fell forward onto his head where he reports he could not catch himself going forward where he hit his head. This is his only fall with no injury. [name] big goal with PT is to be able to indep replace your deck.    How long can you sit comfortably? unlimited    How long can you stand comfortably? 4-5 min    How long can you walk comfortably? 4-5 min    Patient Stated Goals Indep rebuild deck.    Currently in Pain? Yes    Pain Score 9     Pain Location Neck    Pain Orientation Right;Left    Pain Descriptors / Indicators Aching    Pain Type Chronic pain    Pain Onset More than a month ago    Pain Frequency Intermittent                TREATMENT   Ther-ex NuStep L2x 5 minutes for warm-up during history, 2 minutes unbilled; Pball forward and lateral roll outs to stretch back x 3 minutes; Hooklying marchesx 20 BLE; Hooklyingadductorsqueezewith manual resistance from therapistx 20BLE; Hooklying clams with manual resistance from therapist x 20 BLE; Hooklying bridgesx 20; Hooklying heel slides with manually resisted extension x 20 BLE;   Manual Therapy  Cervical PROM in all directions to assess for pain; Suboccipital release x 2 minutes; Upper trap and scalene stretches 45s x 2 bilaterally;   Pt educated throughout session about proper posture and technique with exercises. Improved exercise technique, movement at target joints, use of target muscles after min to mod verbal, visual,  tactile cues.    Pt arrived late today so session was abbreviated accordingly. Need to assess neck at follow-up visit but limited assessment performed today due to acute on chronic pain. Pt is very limited with cervical PROM today. After suboccipital release and PROM he reports significant improvement in his mobility and pain. After limited assessment continued with hooklying strengthening. Patient encouraged to continue HEP and follow-up as scheduled. He has yet to achieve maximal benefit from PT services. Patient will continue benefit from skilled PT services to addressdeficits in strength and pain in order to return to full function at home.  PT Long Term Goals - 05/25/20 1509      PT LONG TERM GOAL #1   Title Pt will improve FOTO score to target goal (49) to demonstrate clinically significant improvement in functional mobility.    Baseline 01/06/20: 38; 01/29/20: 45; 04/08/20: 44; 05/25/20: 36    Time 8    Period Weeks    Status Partially Met    Target Date 07/20/20      PT LONG TERM GOAL #2   Title Pt will improve 5xSTS score by at least 5 sec with no UE support to demonstrate clinically significant improvement in BLE strength.    Baseline 11/18: 27 sec with BUE suppport, 01/29/20: 15.9s, 04/08/20: 13.73 sec; 05/25/20: 15.3s    Time 8    Period Weeks    Status Achieved    Target Date 07/20/20      PT LONG TERM GOAL #3   Title Pt will improve 2 MWT with no AD by 40' to demonstrate clinically significant improvement in endurance to improve household and community ambulation.    Baseline 11/18: 175' no AD; 01/29/20: Deferred due to elevated resting BP;  04/08/20: 307' no AD; 05/25/20: 280' no AD    Time 8    Period Weeks    Status Achieved      PT LONG TERM GOAL #4   Title Pt will ambulate with LRAD with normalized gait pattern > 5 min to reduce tripping hazard with shuffling gait.    Baseline 11/18: Pt amb with single LSC and shuffling  gait after 2 min due to fatigue, 05/25/20: Pt continues to walk with shuffling pattern and limited by fatigue    Time 8    Period Weeks    Status On-going    Target Date 07/20/20                 Plan - 06/01/20 1421    Clinical Impression Statement Pt arrived late today so session was abbreviated accordingly. Need to assess neck at follow-up visit but limited assessment performed today due to acute on chronic pain. Pt is very limited with cervical PROM today. After suboccipital release and PROM he reports significant improvement in his mobility and pain. After limited assessment continued with hooklying strengthening. Patient encouraged to continue HEP and follow-up as scheduled.  He has yet to achieve maximal benefit from PT services. Patient will continue benefit from skilled PT services to address deficits in strength and pain in order to return to full function at home.    Personal Factors and Comorbidities Age;Comorbidity 3+;Education;Past/Current Experience;Time since onset of injury/illness/exacerbation    Comorbidities spinal cord stimulator, paroxysmal A-fib, DM-Type 2, HTN, chronic kidney disease.    Examination-Activity Limitations Bed Mobility;Stand;Locomotion Level;Toileting;Bend;Reach Overhead;Carry;Squat;Hygiene/Grooming;Stairs    Examination-Participation Restrictions Occupation;Community Activity;Yard Work    Stability/Clinical Decision Making Unstable/Unpredictable    Rehab Potential Fair    PT Frequency 2x / week    PT Duration 8 weeks    PT Treatment/Interventions ADLs/Self Care Home Management;Moist Heat;Gait training;Stair training;Therapeutic activities;Therapeutic exercise;Balance training;Neuromuscular re-education;Patient/family education;Manual techniques;Functional mobility training;Cryotherapy    PT Next Visit Plan Progress HEP when appropriate, Improve LE strength and endurance.    PT Home Exercise Plan spinal cord stimulator, paroxysmal A-fib, DM-Type 2, HTN,  chronic kidney disease.    Consulted and Agree with Plan of Care Patient           Patient will benefit from skilled therapeutic intervention in order to improve the following deficits and impairments:  Abnormal gait,Pain,Improper body  mechanics,Decreased mobility,Postural dysfunction,Decreased activity tolerance,Decreased endurance,Decreased range of motion,Decreased strength,Decreased balance,Difficulty walking,Impaired flexibility  Visit Diagnosis: Chronic bilateral low back pain without sciatica  Muscle weakness (generalized)     Problem List Patient Active Problem List   Diagnosis Date Noted  . Cervical facet joint syndrome 04/08/2020  . Cervicalgia 04/08/2020  . Spinal cord stimulator status 12/11/2019  . History of 2019 novel coronavirus disease (COVID-19) 10/02/2019  . CKD (chronic kidney disease) stage 3, GFR 30-59 ml/min (HCC) 01/19/2019  . Acquired thrombophilia ([place]) 01/19/2019  . 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 Postlaminectomy syndrome, lumbar region 01/16/2019  . History of fusion of lumbar spine (L2-L5) 01/16/2019  . Chronic radicular lumbar pain 01/16/2019  . HNP (herniated nucleus pulposus), lumbar 04/29/2018  . Advanced care planning/counseling discussion 09/28/2016  . Spinal stenosis, lumbar region, 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/13/2016  . Hyperlipidemia associated with type 2 diabetes mellitus ([place]) 07/14/2015  . Anemia 06/30/2015  . 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 OSA (obstructive sleep apnea) 03/23/2015  . Type 2 diabetes mellitus with diabetic chronic kidney disease ([place]) 01/05/2015  . Hypertension associated with diabetes ([place]) 09/28/2014  . 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[place]) 09/28/2014  . H/O prior ablation treatment 10/19/2011  . Paroxysmal atrial fibrillation (H[facility]) 10/19/2011   [place] Grout PT, DPT, GCS  Nurah Petrides 06/01/2020, 2:35 PM  [facility] [address] [place], [place], [address] Phone: [phone]   Fax:  [phone]  Name: Sean Reyes. MRN: [id number] Date of Birth: 13-Nov-1940

## 2020-06-07 ENCOUNTER — Ambulatory Visit: Payer: Medicare Other

## 2020-06-07 DIAGNOSIS — M5116 Intervertebral disc disorders with radiculopathy, lumbar region: Secondary | ICD-10-CM | POA: Diagnosis not present

## 2020-06-07 DIAGNOSIS — M5030 Other cervical disc degeneration, unspecified cervical region: Secondary | ICD-10-CM | POA: Diagnosis not present

## 2020-06-07 DIAGNOSIS — M4726 Other spondylosis with radiculopathy, lumbar region: Secondary | ICD-10-CM | POA: Diagnosis not present

## 2020-06-07 DIAGNOSIS — G8929 Other chronic pain: Secondary | ICD-10-CM | POA: Diagnosis not present

## 2020-06-07 DIAGNOSIS — M40204 Unspecified kyphosis, thoracic region: Secondary | ICD-10-CM | POA: Diagnosis not present

## 2020-06-07 DIAGNOSIS — M5416 Radiculopathy, lumbar region: Secondary | ICD-10-CM | POA: Diagnosis not present

## 2020-06-07 DIAGNOSIS — M5134 Other intervertebral disc degeneration, thoracic region: Secondary | ICD-10-CM | POA: Diagnosis not present

## 2020-06-10 ENCOUNTER — Ambulatory Visit (INDEPENDENT_AMBULATORY_CARE_PROVIDER_SITE_OTHER): Payer: Medicare Other | Admitting: Nurse Practitioner

## 2020-06-10 ENCOUNTER — Other Ambulatory Visit: Payer: Self-pay

## 2020-06-10 ENCOUNTER — Ambulatory Visit: Payer: Medicare Other

## 2020-06-10 ENCOUNTER — Encounter: Payer: Self-pay | Admitting: Nurse Practitioner

## 2020-06-10 VITALS — BP 130/75 | HR 61 | Temp 97.8°F | Wt 187.6 lb

## 2020-06-10 DIAGNOSIS — M542 Cervicalgia: Secondary | ICD-10-CM

## 2020-06-10 DIAGNOSIS — D6869 Other thrombophilia: Secondary | ICD-10-CM | POA: Diagnosis not present

## 2020-06-10 DIAGNOSIS — E1169 Type 2 diabetes mellitus with other specified complication: Secondary | ICD-10-CM

## 2020-06-10 DIAGNOSIS — I152 Hypertension secondary to endocrine disorders: Secondary | ICD-10-CM | POA: Diagnosis not present

## 2020-06-10 DIAGNOSIS — N1831 Chronic kidney disease, stage 3a: Secondary | ICD-10-CM | POA: Diagnosis not present

## 2020-06-10 DIAGNOSIS — N138 Other obstructive and reflux uropathy: Secondary | ICD-10-CM

## 2020-06-10 DIAGNOSIS — N401 Enlarged prostate with lower urinary tract symptoms: Secondary | ICD-10-CM

## 2020-06-10 DIAGNOSIS — Z9689 Presence of other specified functional implants: Secondary | ICD-10-CM

## 2020-06-10 DIAGNOSIS — M545 Low back pain, unspecified: Secondary | ICD-10-CM

## 2020-06-10 DIAGNOSIS — D649 Anemia, unspecified: Secondary | ICD-10-CM

## 2020-06-10 DIAGNOSIS — E1143 Type 2 diabetes mellitus with diabetic autonomic (poly)neuropathy: Secondary | ICD-10-CM | POA: Diagnosis not present

## 2020-06-10 DIAGNOSIS — E785 Hyperlipidemia, unspecified: Secondary | ICD-10-CM

## 2020-06-10 DIAGNOSIS — M6281 Muscle weakness (generalized): Secondary | ICD-10-CM

## 2020-06-10 DIAGNOSIS — D520 Dietary folate deficiency anemia: Secondary | ICD-10-CM | POA: Diagnosis not present

## 2020-06-10 DIAGNOSIS — E1159 Type 2 diabetes mellitus with other circulatory complications: Secondary | ICD-10-CM

## 2020-06-10 DIAGNOSIS — E1122 Type 2 diabetes mellitus with diabetic chronic kidney disease: Secondary | ICD-10-CM | POA: Diagnosis not present

## 2020-06-10 DIAGNOSIS — I480 Paroxysmal atrial fibrillation: Secondary | ICD-10-CM

## 2020-06-10 DIAGNOSIS — G8929 Other chronic pain: Secondary | ICD-10-CM

## 2020-06-10 DIAGNOSIS — M961 Postlaminectomy syndrome, not elsewhere classified: Secondary | ICD-10-CM

## 2020-06-10 LAB — BAYER DCA HB A1C WAIVED: HB A1C (BAYER DCA - WAIVED): 6.7 % (ref <7.0)

## 2020-06-10 NOTE — Assessment & Plan Note (Signed)
Chronic pain related to this.  Continue collaboration with pain management and Gabapentin BID.  Is benefiting from PT at this time.

## 2020-06-10 NOTE — Assessment & Plan Note (Signed)
Chronic, ongoing with A1c 6.7% today.  Decreased sensation bilateral feet, monitor closely for wounds and falls.  Continue current medication regimen and adjust as needed.  Return in 3 months.

## 2020-06-10 NOTE — Assessment & Plan Note (Signed)
Chronic, ongoing.  Continue current medication regimen, tolerating 5 MG Crestor well without ADR, and adjust as needed.  Lipid panel next visit.  Return in 3 months.

## 2020-06-10 NOTE — Assessment & Plan Note (Signed)
Chronic, ongoing, currently seeing urology.  Continue current medication regimen and collaboration.

## 2020-06-10 NOTE — Assessment & Plan Note (Signed)
Continue collaboration with pain management. 

## 2020-06-10 NOTE — Assessment & Plan Note (Signed)
Chronic, stable with BP at goal for age in office and on home readings.  A1C 6.7% today. Continue current medication regimen and collaboration with cardiology.  Recommend he continue to check BP at home at least 3 mornings a week and document.  CMP today.  BP is stable with current regimen, recommend we keep ACE on board for kidney protection in his diabetes and can cut back to 20 MG if needed for lower levels. Return in 3 months.

## 2020-06-10 NOTE — Assessment & Plan Note (Signed)
Chronic, ongoing.  Continue to monitor closely and refer to nephrology if decline -- recent labs stable.  Continue Benazepril for kidney protection.  Renal dose medications as needed based on labs.

## 2020-06-10 NOTE — Assessment & Plan Note (Addendum)
Chronic, ongoing with A1C 6.7% today.  Continue Benazepril for kidney protection and may consider reduction of Metformin in future dependent on kidney function on labs.  Educated him on kidney disease and monitoring.  Consider referral to nephrology in future if ever any worsening of function noted -- recent labs stable.  Return in 3 months for T2DM check.

## 2020-06-10 NOTE — Progress Notes (Signed)
BP 130/75   Pulse 61   Temp 97.8 F (36.6 C) (Oral)   Wt 187 lb 9.6 oz (85.1 kg)   SpO2 98%   BMI 24.75 kg/m    Subjective:    Patient ID: Sean Reyes., male    DOB: 1940-05-30, 80 y.o.   MRN: [id number]  HPI: Sean Reyes. is a 80 y.o. male  Chief Complaint  Patient presents with  . Hypertension    Patient states he would like to discuss his BP readings. He notices lately it has been up and down.   [name] Hyperlipidemia  . Atrial Fibrillation  . Diabetes   DIABETES Recent A1C in January 6.6%.Continues on Metformin daily. Hypoglycemic episodes:no Polydipsia/polyuria:no Visual disturbance:no Chest pain:no Paresthesias:no Glucose Monitoring:yes Accucheck frequency: a couple times week Fasting glucose:120 to 130 range Post prandial: Evening: Before meals: Taking Insulin?:no Long acting insulin: Short acting insulin: Blood Pressure Monitoring:a few times a day Retinal Examination:Up to Date Foot Exam:Up to Date Pneumovax:Up to Date Influenza:Up to Date Aspirin:no  HYPERTENSION / HYPERLIPIDEMIA/A-FIB Followed by Dr. Nehemiah Massed for cardiology. Last seen3/24/22 he stopped ACE (patient did not stop this as reports his BP has been fluctuating, so sometimes takes 20 MG and sometimes 40 MG) and decreased beta blocker (25 MG XL once daily). Continues on Eliquis, started over a year agofor atrial fibrillation. Taking Rosuvastatin 5 MG.  Continues Propafenone.   Satisfied with current treatment?yes Duration of hypertension:chronic BP monitoring frequency:a few times a week BP range:120-130/80's BP medication side effects:no Duration of hyperlipidemia:chronic Cholesterol medication side effects:no Cholesterol supplements: none Medication compliance:good compliance Aspirin:no Recent stressors:no Recurrent headaches:no Visual  changes:no Palpitations:no Dyspnea:no Chest pain:no Lower extremity edema:no Dizzy/lightheaded:no The ASCVD Risk score Mikey Bussing DC Jr., et al., 2013) failed to calculate for the following reasons:   The valid total cholesterol range is 130 to 320 mg/dL  CHRONIC KIDNEY DISEASE October improved labsGFR55and CRT 1.13. CKD status:stable Medications renally dose:yes Previous renal evaluation:no Pneumovax:Up to Date Influenza Vaccine:Up to Date CrCl cannot be calculated (Patient's most recent lab result is older than the maximum 21 days allowed.).   CHRONIC PAIN Had thoracic spinal cord stimulator placed initially on 07/01/2019 and then permanent placement on 07/07/2019 - feels that settings are starting to be in correct place -- had reprogram this past week.  CBC H/H 12.3/37.1, MCV 90, B12 692, iron 72, ferritin 72 -- anemia present since his surgical procedure.  Saw Dr. Holley Raring with pain clinic on 12/11/19 and currently doing PT -- he reports the PT is helping.  Is being followed by urology for issues post back surgery with urination -- continues Tamsulosin, Vesicare, Finasteride.  He is currently being followed by [facility] for pain issues ongoing -- now having issue in neck area.   Duration:weeks Lower extremity edema:no Arthralgias:no Myalgias:no Weakness:no Rash:no  Relevant past medical, surgical, family and social history reviewed and updated as indicated. Interim medical history since our last visit reviewed. Allergies and medications reviewed and updated.  Review of Systems  Constitutional: Negative for activity change, appetite change, diaphoresis, fatigue and fever.  Respiratory: Negative for cough, chest tightness, shortness of breath and wheezing.   Cardiovascular: Negative for chest pain, palpitations and leg swelling.  Gastrointestinal: Negative.   Endocrine: Negative for cold intolerance, heat intolerance, polydipsia, polyphagia and polyuria.   Musculoskeletal: Positive for arthralgias.  Neurological: Negative.   Psychiatric/Behavioral: Negative.     Per HPI unless specifically indicated above     Objective:    BP 130/75  Pulse 61   Temp 97.8 F (36.6 C) (Oral)   Wt 187 lb 9.6 oz (85.1 kg)   SpO2 98%   BMI 24.75 kg/m   Wt Readings from Last 3 Encounters:  06/10/20 187 lb 9.6 oz (85.1 kg)  04/08/20 189 lb (85.7 kg)  02/26/20 189 lb 6.4 oz (85.9 kg)    Physical Exam Vitals and nursing note reviewed.  Constitutional:      General: He is awake. He is not in acute distress.    Appearance: He is well-developed and well-groomed. He is not ill-appearing.  HENT:     Head: Normocephalic and atraumatic.     Right Ear: Hearing normal. No drainage.     Left Ear: Hearing normal. No drainage.  Eyes:     General: Lids are normal.        Right eye: No discharge.        Left eye: No discharge.     Conjunctiva/sclera: Conjunctivae normal.     Pupils: Pupils are equal, round, and reactive to light.  Neck:     Thyroid: No thyromegaly.     Vascular: No carotid bruit.     Trachea: Trachea normal.  Cardiovascular:     Rate and Rhythm: Normal rate and regular rhythm.     Heart sounds: Normal heart sounds, S1 normal and S2 normal. No murmur heard. No gallop.   Pulmonary:     Effort: Pulmonary effort is normal. No accessory muscle usage or respiratory distress.     Breath sounds: Normal breath sounds.  Abdominal:     General: Bowel sounds are normal. There is no distension.     Palpations: Abdomen is soft. There is no hepatomegaly.     Tenderness: There is no abdominal tenderness. There is no right CVA tenderness or left CVA tenderness.  Musculoskeletal:        General: Normal range of motion.     Cervical back: Normal range of motion and neck supple.     Right lower leg: No edema.     Left lower leg: No edema.  Lymphadenopathy:     Cervical: No cervical adenopathy.  Skin:    General: Skin is warm and dry.      Capillary Refill: Capillary refill takes less than 2 seconds.  Neurological:     Mental Status: He is alert and oriented to person, place, and time.     Deep Tendon Reflexes:     Reflex Scores:      Patellar reflexes are 1+ on the right side and 1+ on the left side.      Achilles reflexes are 1+ on the right side and 1+ on the left side.    Comments: Antalgic gait, no use of cane -- improving.  Psychiatric:        Attention and Perception: Attention normal.        Mood and Affect: Mood normal.        Speech: Speech normal.        Behavior: Behavior normal. Behavior is cooperative.        Thought Content: Thought content normal.    Results for orders placed or performed in visit on 02/26/20  Bayer DCA Hb A1c Waived  Result Value Ref Range   HB A1C (BAYER DCA - WAIVED) 6.6 [id number] %  Basic metabolic panel  Result Value Ref Range   Glucose 184 (H) 65 - 99 mg/dL   BUN 17 8 - 27 mg/dL   Creatinine, Ser 1.25  0.76 - 1.27 mg/dL   GFR calc non Af Amer 55 (L) >59 mL/min/1.73   GFR calc Af Amer 64 >59 mL/min/1.73   BUN/Creatinine Ratio 14 10 - 24   Sodium 138 134 - 144 mmol/L   Potassium 4.5 3.5 - 5.2 mmol/L   Chloride 99 96 - 106 mmol/L   CO2 25 20 - 29 mmol/L   Calcium 9.6 8.6 - 10.2 mg/dL  Microalbumin, Urine Waived  Result Value Ref Range   Microalb, Ur Waived 80 (H) 0 - 19 mg/L   Creatinine, Urine Waived 200 10 - 300 mg/dL   Microalb/Creat Ratio 30-300 (H) <30 mg/g  Lipid Panel w/o Chol/HDL Ratio  Result Value Ref Range   Cholesterol, Total 120 100 - 199 mg/dL   Triglycerides 103 0 - 149 mg/dL   HDL 43 >39 mg/dL   VLDL Cholesterol Cal 19 5 - 40 mg/dL   LDL Chol Calc (NIH) 58 0 - 99 mg/dL  TSH  Result Value Ref Range   TSH 3.580 0.450 - 4.500 uIU/mL  CBC with Differential/Platelet  Result Value Ref Range   WBC 6.0 3.4 - 10.8 x10E3/uL   RBC 4.13 (L) 4.14 - 5.80 x10E6/uL   Hemoglobin 12.3 (L) 13.0 - 17.7 g/dL   Hematocrit 37.1 (L) 37.5 - 51.0 %   MCV 90 79 - 97 fL   MCH  29.8 26.6 - 33.0 pg   MCHC 33.2 31.5 - 35.7 g/dL   RDW 13.3 11.6 - 15.4 %   Platelets 197 150 - 450 x10E3/uL   Neutrophils 60 Not Estab. %   Lymphs 28 Not Estab. %   Monocytes 10 Not Estab. %   Eos 2 Not Estab. %   Basos 0 Not Estab. %   Neutrophils Absolute 3.6 1.4 - 7.0 x10E3/uL   Lymphocytes Absolute 1.7 0.7 - 3.1 x10E3/uL   Monocytes Absolute 0.6 0.1 - 0.9 x10E3/uL   EOS (ABSOLUTE) 0.1 0.0 - 0.4 x10E3/uL   Basophils Absolute 0.0 0.0 - 0.2 x10E3/uL   Immature Granulocytes 0 Not Estab. %   Immature Grans (Abs) 0.0 0.0 - 0.1 x10E3/uL  VITAMIN D 25 Hydroxy (Vit-D Deficiency, Fractures)  Result Value Ref Range   Vit D, 25-Hydroxy 41.7 30.0 - 100.0 ng/mL  Hepatitis C antibody  Result Value Ref Range   Hep C Virus Ab <0.1 0.0 - 0.9 s/co ratio      Assessment & Plan:   Problem List Items Addressed This Visit      Cardiovascular and Mediastinum   Hypertension associated with diabetes ([place])    Chronic, stable with BP at goal for age in office and on home readings.  A1C 6.7% today. Continue current medication regimen and collaboration with cardiology.  Recommend he continue to check BP at home at least 3 mornings a week and document.  CMP today.  BP is stable with current regimen, recommend we keep ACE on board for kidney protection in his diabetes and can cut back to 20 MG if needed for lower levels. Return in 3 months.      Relevant Orders   Comprehensive metabolic panel   Bayer DCA Hb A1c Waived   Paroxysmal atrial fibrillation (HCC)    Chronic, ongoing.  Continue current medication regimen and collaboration with cardiology.  Monitor CBC closely due to anemia.        Endocrine   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HCC)    Chronic, ongoing with A1c 6.7% today.  Decreased sensation bilateral feet, monitor closely  for wounds and falls.  Continue current medication regimen and adjust as needed.  Return in 3 months.      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  Continue Benazepril for kidney protection and may consider reduction of Metformin in future dependent on kidney function on labs.  Educated him on kidney disease and monitoring.  Consider referral to nephrology in future if ever any worsening of function noted -- recent labs stable.  Return in 3 months for T2DM check.      Relevant Orders   Bayer DCA Hb A1c Waived   Hyperlipidemia associated with type 2 diabetes mellitus (HCC)    Chronic, ongoing.  Continue current medication regimen, tolerating 5 MG Crestor well without ADR, and adjust as needed.  Lipid panel next visit.  Return in 3 months.      Relevant Orders   Bayer DCA Hb A1c Waived     Genitourinary   BPH with obstruction/lower urinary tract symptoms    Chronic, ongoing, currently seeing urology.  Continue current medication regimen and collaboration.      CKD (chronic kidney disease) stage 3, GFR 30-59 ml/min (HCC)    Chronic, ongoing.  Continue to monitor closely and refer to nephrology if decline -- recent labs stable.  Continue Benazepril for kidney protection.  Renal dose medications as needed based on labs.        Hematopoietic and Hemostatic   Acquired thrombophilia ([place])    Patient on Eliquis with A-fib, monitor CBC regularly.        Other   Anemia    CBC, iron, ferritin, folate today, patient on Eliquis.  Some improvement on recent labs -- is post surgical -- risk for bleeding.      Relevant Orders   CBC with Differential/Platelet   Iron, TIBC and Ferritin Panel   Folate   Postlaminectomy syndrome, lumbar region    Chronic pain related to this.  Continue collaboration with pain management and Gabapentin BID.  Is benefiting from PT at this time.      Spinal cord stimulator status    Continue collaboration with pain management.          Follow up plan: Return in about 3 months (around 09/09/2020) for T2DM, HTN/HLD, GERD, A-FIB.

## 2020-06-10 NOTE — Assessment & Plan Note (Signed)
Patient on Eliquis with A-fib, monitor CBC regularly. 

## 2020-06-10 NOTE — Patient Instructions (Signed)
Diabetes Mellitus and Nutrition, Adult When you have diabetes, or diabetes mellitus, it is very important to have healthy eating habits because your blood sugar (glucose) levels are greatly affected by what you eat and drink. Eating healthy foods in the right amounts, at about the same times every day, can help you:  Control your blood glucose.  Lower your risk of heart disease.  Improve your blood pressure.  Reach or maintain a healthy weight. What can affect my meal plan? Every person with diabetes is different, and each person has different needs for a meal plan. Your health care provider may recommend that you work with a dietitian to make a meal plan that is best for you. Your meal plan may vary depending on factors such as:  The calories you need.  The medicines you take.  Your weight.  Your blood glucose, blood pressure, and cholesterol levels.  Your activity level.  Other health conditions you have, such as heart or kidney disease. How do carbohydrates affect me? Carbohydrates, also called carbs, affect your blood glucose level more than any other type of food. Eating carbs naturally raises the amount of glucose in your blood. Carb counting is a method for keeping track of how many carbs you eat. Counting carbs is important to keep your blood glucose at a healthy level, especially if you use insulin or take certain oral diabetes medicines. It is important to know how many carbs you can safely have in each meal. This is different for every person. Your dietitian can help you calculate how many carbs you should have at each meal and for each snack. How does alcohol affect me? Alcohol can cause a sudden decrease in blood glucose (hypoglycemia), especially if you use insulin or take certain oral diabetes medicines. Hypoglycemia can be a life-threatening condition. Symptoms of hypoglycemia, such as sleepiness, dizziness, and confusion, are similar to symptoms of having too much  alcohol.  Do not drink alcohol if: ? Your health care provider tells you not to drink. ? You are pregnant, may be pregnant, or are planning to become pregnant.  If you drink alcohol: ? Do not drink on an empty stomach. ? Limit how much you use to:  0-1 drink a day for women.  0-2 drinks a day for men. ? Be aware of how much alcohol is in your drink. In the [place] one drink equals one 12 oz bottle of beer (355 mL), one 5 oz glass of wine (148 mL), or one 1 oz glass of hard liquor (44 mL). ? Keep yourself hydrated with water, diet soda, or unsweetened iced tea.  Keep in mind that regular soda, juice, and other mixers may contain a lot of sugar and must be counted as carbs. What are tips for following this plan? Reading food labels  Start by checking the serving size on the "Nutrition Facts" label of packaged foods and drinks. The amount of calories, carbs, fats, and other nutrients listed on the label is based on one serving of the item. Many items contain more than one serving per package.  Check the total grams (g) of carbs in one serving. You can calculate the number of servings of carbs in one serving by dividing the total carbs by 15. For example, if a food has 30 g of total carbs per serving, it would be equal to 2 servings of carbs.  Check the number of grams (g) of saturated fats and trans fats in one serving. Choose foods that have   a low amount or none of these fats.  Check the number of milligrams (mg) of salt (sodium) in one serving. Most people should limit total sodium intake to less than 2,300 mg per day.  Always check the nutrition information of foods labeled as "low-fat" or "nonfat." These foods may be higher in added sugar or refined carbs and should be avoided.  Talk to your dietitian to identify your daily goals for nutrients listed on the label. Shopping  Avoid buying canned, pre-made, or processed foods. These foods tend to be high in fat, sodium, and added  sugar.  Shop around the outside edge of the grocery store. This is where you will most often find fresh fruits and vegetables, bulk grains, fresh meats, and fresh dairy. Cooking  Use low-heat cooking methods, such as baking, instead of high-heat cooking methods like deep frying.  Cook using healthy oils, such as olive, canola, or sunflower oil.  Avoid cooking with butter, cream, or high-fat meats. Meal planning  Eat meals and snacks regularly, preferably at the same times every day. Avoid going long periods of time without eating.  Eat foods that are high in fiber, such as fresh fruits, vegetables, beans, and whole grains. Talk with your dietitian about how many servings of carbs you can eat at each meal.  Eat 4-6 oz (112-168 g) of lean protein each day, such as lean meat, chicken, fish, eggs, or tofu. One ounce (oz) of lean protein is equal to: ? 1 oz (28 g) of meat, chicken, or fish. ? 1 egg. ?  cup (62 g) of tofu.  Eat some foods each day that contain healthy fats, such as avocado, nuts, seeds, and fish.   What foods should I eat? Fruits Berries. Apples. Oranges. Peaches. Apricots. Plums. Grapes. Mango. Papaya. Pomegranate. Kiwi. Cherries. Vegetables Lettuce. Spinach. Leafy greens, including kale, chard, collard greens, and mustard greens. Beets. Cauliflower. Cabbage. Broccoli. Carrots. Green beans. Tomatoes. Peppers. Onions. Cucumbers. Brussels sprouts. Grains Whole grains, such as whole-wheat or whole-grain bread, crackers, tortillas, cereal, and pasta. Unsweetened oatmeal. Quinoa. Brown or wild rice. Meats and other proteins Seafood. Poultry without skin. Lean cuts of poultry and beef. Tofu. Nuts. Seeds. Dairy Low-fat or fat-free dairy products such as milk, yogurt, and cheese. The items listed above may not be a complete list of foods and beverages you can eat. Contact a dietitian for more information. What foods should I avoid? Fruits Fruits canned with  syrup. Vegetables Canned vegetables. Frozen vegetables with butter or cream sauce. Grains Refined white flour and flour products such as bread, pasta, snack foods, and cereals. Avoid all processed foods. Meats and other proteins Fatty cuts of meat. Poultry with skin. Breaded or fried meats. Processed meat. Avoid saturated fats. Dairy Full-fat yogurt, cheese, or milk. Beverages Sweetened drinks, such as soda or iced tea. The items listed above may not be a complete list of foods and beverages you should avoid. Contact a dietitian for more information. Questions to ask a health care provider  Do I need to meet with a diabetes educator?  Do I need to meet with a dietitian?  What number can I call if I have questions?  When are the best times to check my blood glucose? Where to find more information:  American Diabetes Association: diabetes.org  Academy of Nutrition and Dietetics: www.eatright.org  National Institute of Diabetes and Digestive and Kidney Diseases: www.niddk.nih.gov  Association of Diabetes Care and Education Specialists: www.diabeteseducator.org Summary  It is important to have healthy eating   habits because your blood sugar (glucose) levels are greatly affected by what you eat and drink.  A healthy meal plan will help you control your blood glucose and maintain a healthy lifestyle.  Your health care provider may recommend that you work with a dietitian to make a meal plan that is best for you.  Keep in mind that carbohydrates (carbs) and alcohol have immediate effects on your blood glucose levels. It is important to count carbs and to use alcohol carefully. This information is not intended to replace advice given to you by your health care provider. Make sure you discuss any questions you have with your health care provider. Document Revised: 01/07/2019 Document Reviewed: 01/07/2019 Elsevier Patient Education  2021 Elsevier Inc.  

## 2020-06-10 NOTE — Assessment & Plan Note (Signed)
Chronic, ongoing.  Continue current medication regimen and collaboration with cardiology.  Monitor CBC closely due to anemia.

## 2020-06-10 NOTE — Therapy (Signed)
[facility] [address]. [place], [place], [address] Phone: [phone]   Fax:  [phone]  Physical Therapy Treatment/Recertification  Patient Details  Name: Sean Reyes. MRN: [id number] Date of Birth: 1940-09-11 Referring Provider (PT): Gillis Santa, MD   Encounter Date: 06/10/2020   PT End of Session - 06/11/20 1038    Visit Number 27    Number of Visits 75    Date for PT Re-Evaluation 07/20/20    Authorization Type eval: 01/01/20    PT Start Time 1115    PT Stop Time 1155    PT Time Calculation (min) 40 min    Activity Tolerance Patient tolerated treatment well    Behavior During Therapy [facility] for tasks assessed/performed           Past Medical History:  Diagnosis Date  . Anemia   . Anxiety   . Arthritis   . Atrial fibrillation ([place])   . Cataracts, bilateral   . Complication of anesthesia    pt reports low BP's after surgery at [facility] and difficulty awakening  . Depression   . Diabetes ([place])    dx 6-8 yrs ago  . Dysrhythmia    a-fib  . GERD (gastroesophageal reflux disease)    OCC TAKES ALKA SELTZER  . History of kidney stones    10-15 yrs ago  . 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 Hyperlipidemia   . Hypertension   . Nocturia   . S/P ablation of atrial fibrillation    Ablative therapy  . Sleep apnea    CPAP   . Tachycardia, unspecified     Past Surgical History:  Procedure Laterality Date  . ABLATION    . ANTERIOR LAT LUMBAR FUSION N/A 06/27/2017   Procedure: Anterior Lateral Lumbar Interbody  Fusion - Lumbar Two-Lumbar Three - Lumbar Three-Lumbar Four, Posterior Lumbar Interbody Fusion Lumbar Four- Five;  Surgeon: Kary Kos, MD;  Location: [facility];  Service: Neurosurgery;  Laterality: N/A;  Anterior Lateral Lumbar Interbody  Fusion - Lumbar Two-Lumbar Three - Lumbar Three-Lumbar Four, Posterior Lumbar Interbody Fusion Lumbar Four- Five  . BACK SURGERY    . CARDIOVERSION N/A  08/29/2018   Procedure: CARDIOVERSION;  Surgeon: Corey Skains, MD;  Location: [facility];  Service: Cardiovascular;  Laterality: N/A;  . CARDIOVERSION N/A 09/24/2018   Procedure: CARDIOVERSION;  Surgeon: Corey Skains, MD;  Location: [facility];  Service: Cardiovascular;  Laterality: N/A;  . COLONOSCOPY WITH PROPOFOL N/A 10/05/2015   Procedure: COLONOSCOPY WITH PROPOFOL;  Surgeon: Lollie Sails, MD;  Location: [facility] ENDOSCOPY;  Service: Endoscopy;  Laterality: N/A;  . ESOPHAGOGASTRODUODENOSCOPY (EGD) WITH PROPOFOL N/A 04/01/2018   Procedure: ESOPHAGOGASTRODUODENOSCOPY (EGD) WITH PROPOFOL;  Surgeon: Lollie Sails, MD;  Location: [facility] ENDOSCOPY;  Service: Endoscopy;  Laterality: N/A;  . HERNIA REPAIR    . JOINT REPLACEMENT Bilateral    hips  RT+  LEFT X2   . LUMBAR LAMINECTOMY/DECOMPRESSION MICRODISCECTOMY Left 09/13/2016   Procedure: Microdiscectomy - Lumbar two-three,  Lumbar three- - left;  Surgeon: Kary Kos, MD;  Location: [facility];  Service: Neurosurgery;  Laterality: Left;  . SPINAL CORD STIMULATOR INSERTION  07/08/2019  . TONSILLECTOMY      There were no vitals filed for this visit.   Subjective Assessment - 06/10/20 1115    Subjective Pt reports that he is doing well today. He states that his back and neck are actually doing pretty well today.  He  talked to the company about his spinal cord stimulator and states that his back pain has been much better since they decreased the intensity.  They felt like he might have been overstimulated. No specific questions upon arrival.    Pertinent History Pt is a 80 y.o. male referred to PT for LBP. Pt has extensive PMH of failed back surgical syndrome, postlaminectomy syndrome, chronic radicular pain, hx of lumbar fusion spine, spinal cord stimulator, paroxysmal A-fib, DM-Type 2, HTN, chronic kidney disease. Pt reports 4 fusions on lumbar spine due to protruding disks which was ~2 years ago. Pt reports that his spine stimulator has started to  help some but his biggest issue is his decreased strength in his spine, legs, and core. In sitting pt has 0/10 NPS. With household distance pt has 4-5/10 NPS but is instantly relieved with sitting. With long walks, from the clinic to the car (100 yds)  8-9/10 NPS. The pain is described as dull pain. Pt denies radicular symptoms. [name] LBP is located acros his belt line right above his buttocks. Pt reports difficulty with his balance and asc/desc stairs but at home he uses no AD at home. Pt does report a fall 1.5 months ago with his cane and fell forward onto his head where he reports he could not catch himself going forward where he hit his head. This is his only fall with no injury. [name] big goal with PT is to be able to indep replace your deck. 06/10/20: Pt reports that around the first of the year he started having upper neck pain bilaterally. No known trauma. Pt states that he had a similar pain in the past but it self-resolved. No history of cervical surgeries. He reports pain most notably when trying to turn his head. No radicular symptoms into either shoulder or UE. He had cervical plain film radiographs on 04/16/20 which showed degenerative changes but otherwise no significant findings. He saw [place] on 04/19/20 prescribed a low-dose Medrol Dosepak and order was sent for physical therapy. At that time they also discussed need for possible cervical spine steroid injections. Pt reports that his pain has improved since that time but still persists.    How long can you sit comfortably? unlimited    How long can you stand comfortably? 4-5 min    How long can you walk comfortably? 4-5 min    Patient Stated Goals Indep rebuild deck.    Currently in Pain? Yes    Pain Score 2    Worst: 8/10, Best: 0/10   Pain Location Neck    Pain Orientation Right;Left    Pain Descriptors / Indicators Aching    Pain Type Chronic pain    Pain Onset More than a month ago    Pain Frequency Intermittent     Aggravating Factors  Cervical rotation    Pain Relieving Factors Rest           TREATMENT    Manual Therapy   Onset: Pt reports that around the first of the year he started having upper neck pain bilaterally. No known trauma. Pt states that he had a similar pain in the past but it self-resolved. No history of cervical surgeries. He reports pain most notably when trying to turn his head. No radicular symptoms into either shoulder or UE. He had cervical plain film radiographs on 04/16/20 which showed degenerative changes but otherwise no significant findings. He saw [place] on 04/19/20 prescribed a low-dose Medrol Dosepak and order  was sent for physical therapy. At that time they also discussed need for possible cervical spine steroid injections. Pt reports that his pain has improved since that time but still persists.  Pain: 0/10 Present, 0/10 Best, 8/10 Worst: Aggravating factors: turning head Easing factors: rest Recent neck trauma: No Prior history of neck injury or pain: Yes Pain quality: pain quality: sharp Radiating pain: Yes, once down into the L shoulder Numbness/Tingling: No Imaging: Yes,  Per radiology report: Degenerative changes.  No other abnormalities.    Posture Severe forward head posture, thoracic kyphosis, and rounded shoulders    Palpation Patient denies pain to palpation along bilateral cervical paraspinals, suboccipitals, levator scapula, and upper trap.   Strength Cervical isometrics are strong and painless in all directions;  AROM R/L 35* Cervical Flexion 5* Cervical Extension 15*/18 Cervical Lateral Flexion 35/30* Cervical Rotation *Indicates pain, overpressure performed unless otherwise indicated  Repeated Movements No centralization or peripheralization of symptoms with repeated cervical protraction and retraction.    Passive Accessory Intervertebral Motion (PAIVM) Pt denies reproduction of neck pain with CPA C2-T7 and UPA bilaterally  C2-T7 as assessed in sitting. Pt unable to lay in prone due to back pain and posture. Generally hypomobile throughout  Upper trap and scalene stretches 45s x 2 bilaterally; Gentle cervical traction 20s hold/20s relax x 3; Supine C2-C5 CPA, grade 1 through 2, 20 seconds per bout x1 bout per level; STM to bilateral suboccipitals, scalenes, and upper trap;   Trigger Point Dry Needling (TDN), unbilled Education performed with patient regarding potential benefit of TDN. Reviewed precautions and risks with patient. Reviewed special precautions/risks over lung fields which include pneumothorax. Reviewed signs and symptoms of pneumothorax and advised pt to go to ER immediately if these symptoms develop advise them of dry needling treatment. Extensive time spent with pt to ensure full understanding of TDN risks. Pt provided verbal consent to treatment. Utilizing clean technique TDN performed to bilateral upper traps with 2, 0.25 x 40 single needle placements (one on each side) with local twitch response (LTR) bilaterally. Pistoning technique utilized. Improved pain-free motion following intervention.     Received order for physical therapy from orthopedics so cervical spine assessment performed today.  Patient with severe loss of cervical range of motion with pain in flexion, extension, right lateral flexion, and left cervical rotation.  No significant pain to palpation to posterior neck and upper quarter.  Patient with severe forward head and rounded shoulders with thoracic kyphosis.  Initiated treatment to neck today with significant improvement in range of motion and decrease in pain. Patient encouraged to continue HEP and follow-up as scheduled. He has yet to achieve maximal benefit from PT services. Patient will continue benefit from skilled PT services to addressdeficits in strength and pain in order to return to full function at home.                PT Education - 06/11/20 1051     Education Details Plan of care    Person(s) Educated Patient    Methods Explanation    Comprehension Verbalized understanding               PT Long Term Goals - 06/11/20 1039      PT LONG TERM GOAL #1   Title Pt will improve FOTO score to target goal (49) to demonstrate clinically significant improvement in functional mobility.    Baseline 01/06/20: 38; 01/29/20: 45; 04/08/20: 44; 05/25/20: 36    Time 8    Period Weeks  Status Partially Met      PT LONG TERM GOAL #2   Title Pt will improve 5xSTS score by at least 5 sec with no UE support to demonstrate clinically significant improvement in BLE strength.    Baseline 11/18: 27 sec with BUE suppport, 01/29/20: 15.9s, 04/08/20: 13.73 sec; 05/25/20: 15.3s    Time 8    Period Weeks    Status Achieved      PT LONG TERM GOAL #3   Title Pt will improve 2 MWT with no AD by 40' to demonstrate clinically significant improvement in endurance to improve household and community ambulation.    Baseline 11/18: 175' no AD; 01/29/20: Deferred due to elevated resting BP;  04/08/20: 307' no AD; 05/25/20: 280' no AD    Time 8    Period Weeks    Status Achieved      PT LONG TERM GOAL #4   Title Pt will ambulate with LRAD with normalized gait pattern > 5 min to reduce tripping hazard with shuffling gait.    Baseline 11/18: Pt amb with single LSC and shuffling gait after 2 min due to fatigue, 05/25/20: Pt continues to walk with shuffling pattern and limited by fatigue    Time 8    Period Weeks    Status On-going      PT LONG TERM GOAL #5   Title Pt will decrease worst neck pain as reported on NPRS by at least 2 points in order to demonstrate clinically significant reduction in back pain.    Baseline 06/10/20: worst: 8/10    Time 8    Period Weeks    Status New    Target Date 07/20/20      Additional Long Term Goals   Additional Long Term Goals Yes      PT LONG TERM GOAL #6   Title Patient reportedly 75% improvement in his neck pain symptoms in  order to resume full function at home with less pain    Time 8    Period Weeks    Status New    Target Date 07/20/20      PT LONG TERM GOAL #7   Title Patient will improve pain-free cervical rotation to least 50 degrees bilaterally in order to drive his car without any increase in pain when turning his head    Baseline 06/10/20: R/L: 35/30    Time 8    Period Weeks    Status New    Target Date 07/20/20                 Plan - 06/11/20 1039    Clinical Impression Statement Received order for physical therapy from orthopedics so cervical spine assessment performed today.  Patient with severe loss of cervical range of motion with pain in flexion, extension, right lateral flexion, and left cervical rotation.  No significant pain to palpation to posterior neck and upper quarter.  Patient with severe forward head and rounded shoulders with thoracic kyphosis.  Initiated treatment to neck today with significant improvement in range of motion and decrease in pain. Patient encouraged to continue HEP and follow-up as scheduled.  He has yet to achieve maximal benefit from PT services. Patient will continue benefit from skilled PT services to address deficits in strength and pain in order to return to full function at home.    Personal Factors and Comorbidities Age;Comorbidity 3+;Education;Past/Current Experience;Time since onset of injury/illness/exacerbation    Comorbidities spinal cord stimulator, paroxysmal A-fib, DM-Type 2, HTN, chronic  kidney disease.    Examination-Activity Limitations Bed Mobility;Stand;Locomotion Level;Toileting;Bend;Reach Overhead;Carry;Squat;Hygiene/Grooming;Stairs    Examination-Participation Restrictions Occupation;Community Activity;Yard Work    Stability/Clinical Decision Making Unstable/Unpredictable    Rehab Potential Fair    PT Frequency 2x / week    PT Duration 8 weeks    PT Treatment/Interventions ADLs/Self Care Home Management;Moist Heat;Gait training;Stair  training;Therapeutic activities;Therapeutic exercise;Balance training;Neuromuscular re-education;Patient/family education;Manual techniques;Functional mobility training;Cryotherapy;Aquatic Therapy;Biofeedback;Canalith Repostioning;Electrical Stimulation;Iontophoresis 67m/ml Dexamethasone;Traction;Ultrasound;DME Instruction;Passive range of motion;Dry needling;Vestibular;Spinal Manipulations;Joint Manipulations    PT Next Visit Plan Progress HEP when appropriate, Improve LE strength and endurance.    PT Home Exercise Plan Continue as prescribed    Consulted and Agree with Plan of Care Patient           Patient will benefit from skilled therapeutic intervention in order to improve the following deficits and impairments:  Abnormal gait,Pain,Improper body mechanics,Decreased mobility,Postural dysfunction,Decreased activity tolerance,Decreased endurance,Decreased range of motion,Decreased strength,Decreased balance,Difficulty walking,Impaired flexibility  Visit Diagnosis: Cervicalgia  Chronic bilateral low back pain without sciatica     Problem List Patient Active Problem List   Diagnosis Date Noted  . Cervical facet joint syndrome 04/08/2020  . Cervicalgia 04/08/2020  . Spinal cord stimulator status 12/11/2019  . History of 2019 novel coronavirus disease (COVID-19) 10/02/2019  . CKD (chronic kidney disease) stage 3, GFR 30-59 ml/min (HCC) 01/19/2019  . Acquired thrombophilia ([place] 01/19/2019  . 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 Postlaminectomy syndrome, lumbar region 01/16/2019  . History of fusion of lumbar spine (L2-L5) 01/16/2019  . Chronic radicular lumbar pain 01/16/2019  . HNP (herniated nucleus pulposus), lumbar 04/29/2018  . Advanced care planning/counseling discussion 09/28/2016  . Hyperlipidemia associated with type 2 diabetes mellitus ([place] 07/14/2015  . Anemia 06/30/2015  . 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 OSA (obstructive sleep  apnea) 03/23/2015  . Type 2 diabetes mellitus with diabetic chronic kidney disease ([place] 01/05/2015  . Hypertension associated with diabetes ([place] 09/28/2014  . 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[place] 09/28/2014  . H/O prior ablation treatment 10/19/2011  . Paroxysmal atrial fibrillation (HCC) 10/19/2011   JPhillips GroutPT, DPT, GCS  Sean Reyes 06/11/2020, 1:24 PM  [place] [facility] [place] [address]Phone: [phone]  Fax:  [phone] Name: WElesa Hacker MRN: [id number] of Birth: 612-11-1940

## 2020-06-10 NOTE — Assessment & Plan Note (Addendum)
CBC, iron, ferritin, folate today, patient on Eliquis.  Some improvement on recent labs -- is post surgical -- risk for bleeding.

## 2020-06-11 LAB — CBC WITH DIFFERENTIAL/PLATELET
Basophils Absolute: 0 10*3/uL (ref 0.0–0.2)
Basos: 0 %
EOS (ABSOLUTE): 0.2 10*3/uL (ref 0.0–0.4)
Eos: 3 %
Hematocrit: 36.1 % — ABNORMAL LOW (ref 37.5–51.0)
Hemoglobin: 11.8 g/dL — ABNORMAL LOW (ref 13.0–17.7)
Immature Grans (Abs): 0 10*3/uL (ref 0.0–0.1)
Immature Granulocytes: 0 %
Lymphocytes Absolute: 1.6 10*3/uL (ref 0.7–3.1)
Lymphs: 27 %
MCH: 30.2 pg (ref 26.6–33.0)
MCHC: 32.7 g/dL (ref 31.5–35.7)
MCV: 92 fL (ref 79–97)
Monocytes Absolute: 0.6 10*3/uL (ref 0.1–0.9)
Monocytes: 10 %
Neutrophils Absolute: 3.5 10*3/uL (ref 1.4–7.0)
Neutrophils: 60 %
Platelets: 175 10*3/uL (ref 150–450)
RBC: 3.91 x10E6/uL — ABNORMAL LOW (ref 4.14–5.80)
RDW: 13.8 % (ref 11.6–15.4)
WBC: 5.9 10*3/uL (ref 3.4–10.8)

## 2020-06-11 LAB — COMPREHENSIVE METABOLIC PANEL
ALT: 19 IU/L (ref 0–44)
AST: 16 IU/L (ref 0–40)
Albumin/Globulin Ratio: 1.6 (ref 1.2–2.2)
Albumin: 4.1 g/dL (ref 3.7–4.7)
Alkaline Phosphatase: 69 IU/L (ref 44–121)
BUN/Creatinine Ratio: 16 (ref 10–24)
BUN: 20 mg/dL (ref 8–27)
Bilirubin Total: 0.4 mg/dL (ref 0.0–1.2)
CO2: 24 mmol/L (ref 20–29)
Calcium: 9.6 mg/dL (ref 8.6–10.2)
Chloride: 98 mmol/L (ref 96–106)
Creatinine, Ser: 1.29 mg/dL — ABNORMAL HIGH (ref 0.76–1.27)
Globulin, Total: 2.6 g/dL (ref 1.5–4.5)
Glucose: 198 mg/dL — ABNORMAL HIGH (ref 65–99)
Potassium: 4.5 mmol/L (ref 3.5–5.2)
Sodium: 137 mmol/L (ref 134–144)
Total Protein: 6.7 g/dL (ref 6.0–8.5)
eGFR: 57 mL/min/{1.73_m2} — ABNORMAL LOW (ref >59)

## 2020-06-11 LAB — IRON,TIBC AND FERRITIN PANEL
Ferritin: 51 ng/mL (ref 30–400)
Iron Saturation: 19 % (ref 15–55)
Iron: 65 ug/dL (ref 38–169)
Total Iron Binding Capacity: 342 ug/dL (ref 250–450)
UIBC: 277 ug/dL (ref 111–343)

## 2020-06-11 LAB — FOLATE: Folate: 9.6 ng/mL (ref >3.0)

## 2020-06-11 NOTE — Patient Instructions (Addendum)
Upper trap and scalene stretches45s x 2 bilaterally; Gentle cervical traction 20s hold/20s relax x 3; Supine C2-C5 CPA, grade 1 through 2, 20 seconds per bout x1 bout per level; STM to bilateral suboccipitals, scalenes, and upper trap;   Trigger Point Dry Needling (TDN), unbilled Education performed with patient regarding potential benefit of TDN. Reviewed precautions and risks with patient. Reviewed special precautions/risks over lung fields which include pneumothorax. Reviewed signs and symptoms of pneumothorax and advised pt to go to ER immediately if these symptoms develop advise them of dry needling treatment. Extensive time spent with pt to ensure full understanding of TDN risks. Pt provided verbal consent to treatment. Utilizing clean technique TDN performed to bilateral upper traps with 2, 0.25 x 40 single needle placements (one on each side) with local twitch response (LTR) bilaterally. Pistoning technique utilized. Improved pain-free motion following intervention.

## 2020-06-11 NOTE — Progress Notes (Signed)
Contacted via [place] afternoon [place], your labs have returned. You are continuing to show a little anemia with hemoglobin mildly on low side.  Iron is on low side of normal, I would recommend starting an iron supplement daily.  I find Slow Fe to be the most gentle on stomach.  Are you taking B12 too?  If so continue this and we will recheck next visit.  If you continue to be a little low I may send you to hematology or GI to assess for other causes of anemia.  Kidney function, creatinine and eGFR, are a little low this check meaning some mild kidney disease.  We will continue to monitor this closely and ensure you get good water intake daily.  Any questions?

## 2020-06-14 ENCOUNTER — Other Ambulatory Visit: Payer: Self-pay

## 2020-06-14 ENCOUNTER — Ambulatory Visit: Payer: Medicare Other | Attending: Student in an Organized Health Care Education/Training Program

## 2020-06-14 DIAGNOSIS — M545 Low back pain, unspecified: Secondary | ICD-10-CM | POA: Diagnosis not present

## 2020-06-14 DIAGNOSIS — M542 Cervicalgia: Secondary | ICD-10-CM | POA: Diagnosis not present

## 2020-06-14 DIAGNOSIS — G8929 Other chronic pain: Secondary | ICD-10-CM

## 2020-06-14 NOTE — Therapy (Signed)
[facility] [address] [place], [place], [address] Phone: [phone]   Fax:  [phone]  Physical Therapy Treatment  Patient Details  Name: Sean Reyes. MRN: [id number] Date of Birth: [birth year] Referring Provider (PT): Gillis Santa, MD   Encounter Date: 06/14/2020   PT End of Session - 06/14/20 1449    Visit Number 28    Number of Visits 52    Date for PT Re-Evaluation 07/20/20    Authorization Type eval: 01/01/20    PT Start Time 1400    PT Stop Time 1445    PT Time Calculation (min) 45 min    Activity Tolerance Patient tolerated treatment well    Behavior During Therapy [facility] for tasks assessed/performed           Past Medical History:  Diagnosis Date  . Anemia   . Anxiety   . Arthritis   . Atrial fibrillation ([place])   . Cataracts, bilateral   . Complication of anesthesia    pt reports low BP's after surgery at [facility] and difficulty awakening  . Depression   . Diabetes ([place])    dx 6-8 yrs ago  . Dysrhythmia    a-fib  . GERD (gastroesophageal reflux disease)    OCC TAKES ALKA SELTZER  . History of kidney stones    10-15 yrs ago  . 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 Hyperlipidemia   . Hypertension   . Nocturia   . S/P ablation of atrial fibrillation    Ablative therapy  . Sleep apnea    CPAP   . Tachycardia, unspecified     Past Surgical History:  Procedure Laterality Date  . ABLATION    . ANTERIOR LAT LUMBAR FUSION N/A 06/27/2017   Procedure: Anterior Lateral Lumbar Interbody  Fusion - Lumbar Two-Lumbar Three - Lumbar Three-Lumbar Four, Posterior Lumbar Interbody Fusion Lumbar Four- Five;  Surgeon: Kary Kos, MD;  Location: [facility];  Service: Neurosurgery;  Laterality: N/A;  Anterior Lateral Lumbar Interbody  Fusion - Lumbar Two-Lumbar Three - Lumbar Three-Lumbar Four, Posterior Lumbar Interbody Fusion Lumbar Four- Five  . BACK SURGERY    . CARDIOVERSION N/A 08/29/2018    Procedure: CARDIOVERSION;  Surgeon: Corey Skains, MD;  Location: [facility];  Service: Cardiovascular;  Laterality: N/A;  . CARDIOVERSION N/A 09/24/2018   Procedure: CARDIOVERSION;  Surgeon: Corey Skains, MD;  Location: [facility];  Service: Cardiovascular;  Laterality: N/A;  . COLONOSCOPY WITH PROPOFOL N/A 10/05/2015   Procedure: COLONOSCOPY WITH PROPOFOL;  Surgeon: Lollie Sails, MD;  Location: [facility] ENDOSCOPY;  Service: Endoscopy;  Laterality: N/A;  . ESOPHAGOGASTRODUODENOSCOPY (EGD) WITH PROPOFOL N/A 04/01/2018   Procedure: ESOPHAGOGASTRODUODENOSCOPY (EGD) WITH PROPOFOL;  Surgeon: Lollie Sails, MD;  Location: [facility] ENDOSCOPY;  Service: Endoscopy;  Laterality: N/A;  . HERNIA REPAIR    . JOINT REPLACEMENT Bilateral    hips  RT+  LEFT X2   . LUMBAR LAMINECTOMY/DECOMPRESSION MICRODISCECTOMY Left 09/13/2016   Procedure: Microdiscectomy - Lumbar two-three,  Lumbar three- - left;  Surgeon: Kary Kos, MD;  Location: [place];  Service: Neurosurgery;  Laterality: Left;  . SPINAL CORD STIMULATOR INSERTION  07/08/2019  . TONSILLECTOMY      There were no vitals filed for this visit.   Subjective Assessment - 06/14/20 1400    Subjective Pt reports that he is doing well today. He is not having any resting neck pain currently but still notices pain when he  turns his head. He did have significant relief after the last therapy session. He rates his low back pain as a 5/10 upn arrival. No specific questions upon arrival.    Pertinent History Pt is a 80 y.o. male referred to PT for LBP. Pt has extensive PMH of failed back surgical syndrome, postlaminectomy syndrome, chronic radicular pain, hx of lumbar fusion spine, spinal cord stimulator, paroxysmal A-fib, DM-Type 2, HTN, chronic kidney disease. Pt reports 4 fusions on lumbar spine due to protruding disks which was ~2 years ago. Pt reports that his spine stimulator has started to help some but his biggest issue is his decreased strength in his spine,  legs, and core. In sitting pt has 0/10 NPS. With household distance pt has 4-5/10 NPS but is instantly relieved with sitting. With long walks, from the clinic to the car (100 yds)  8-9/10 NPS. The pain is described as dull pain. Pt denies radicular symptoms. [name] LBP is located acros his belt line right above his buttocks. Pt reports difficulty with his balance and asc/desc stairs but at home he uses no AD at home. Pt does report a fall 1.5 months ago with his cane and fell forward onto his head where he reports he could not catch himself going forward where he hit his head. This is his only fall with no injury. [name] big goal with PT is to be able to indep replace your deck. 06/10/20: Pt reports that around the first of the year he started having upper neck pain bilaterally. No known trauma. Pt states that he had a similar pain in the past but it self-resolved. No history of cervical surgeries. He reports pain most notably when trying to turn his head. No radicular symptoms into either shoulder or UE. He had cervical plain film radiographs on 04/16/20 which showed degenerative changes but otherwise no significant findings. He saw [place] on 04/19/20 prescribed a low-dose Medrol Dosepak and order was sent for physical therapy. At that time they also discussed need for possible cervical spine steroid injections. Pt reports that his pain has improved since that time but still persists.    How long can you sit comfortably? unlimited    How long can you stand comfortably? 4-5 min    How long can you walk comfortably? 4-5 min    Patient Stated Goals Indep rebuild deck.    Currently in Pain? Yes    Pain Score 5     Pain Location Back    Pain Orientation Lower    Pain Descriptors / Indicators Aching    Pain Type Chronic pain    Pain Onset More than a month ago    Pain Frequency Intermittent             TREATMENT   Manual Therapy  Upper trap, scalene, and rotation stretches45s x 2 each  bilaterally; Gentle cervical traction 20s hold/20s relax x 3; Supine C2-C5 CPA and bilateral UPA, grade 1 through 2, 30 seconds per bout x 2 bouts per level each; STM to bilateral suboccipitals, scalenes, and upper trap; Suboccipital stretch 30s x 2;   Ther-ex  MET cervical rotation stretches 5s contract/5s stretch x 3 each direction, performed twice both directions; Repeated cervical retraction with overpressure 3s hold x 10; Isometric lateral flexion strengthening 5s hold x 5 each direction;   Pt educated throughout session about proper posture and technique with exercises. Improved exercise technique, movement at target joints, use of target muscles after min to mod verbal,  visual, tactile cues.    Patient arrived with good motivation to participate with therapy.  Deferred trigger point dry needling today due to persistent soreness in bilateral upper traps from last therapy session.  Continue with manual techniques for cervical mobility including passive accessory mobilizations as well as stretches.  Continued with soft tissue mobilization to bilateral suboccipitals, scalenes, and upper traps.  Patient encouraged to continue HEP and follow-up as scheduled. Pt will benefit from PT services to address deficits in strength, pain, and mobility in order to return to full function at home with less pain.                           PT Long Term Goals - 06/11/20 1039      PT LONG TERM GOAL #1   Title Pt will improve FOTO score to target goal (49) to demonstrate clinically significant improvement in functional mobility.    Baseline 01/06/20: 38; 01/29/20: 45; 04/08/20: 44; 05/25/20: 36    Time 8    Period Weeks    Status Partially Met      PT LONG TERM GOAL #2   Title Pt will improve 5xSTS score by at least 5 sec with no UE support to demonstrate clinically significant improvement in BLE strength.    Baseline 11/18: 27 sec with BUE suppport, 01/29/20: 15.9s, 04/08/20:  13.73 sec; 05/25/20: 15.3s    Time 8    Period Weeks    Status Achieved      PT LONG TERM GOAL #3   Title Pt will improve 2 MWT with no AD by 40' to demonstrate clinically significant improvement in endurance to improve household and community ambulation.    Baseline 11/18: 175' no AD; 01/29/20: Deferred due to elevated resting BP;  04/08/20: 307' no AD; 05/25/20: 280' no AD    Time 8    Period Weeks    Status Achieved      PT LONG TERM GOAL #4   Title Pt will ambulate with LRAD with normalized gait pattern > 5 min to reduce tripping hazard with shuffling gait.    Baseline 11/18: Pt amb with single LSC and shuffling gait after 2 min due to fatigue, 05/25/20: Pt continues to walk with shuffling pattern and limited by fatigue    Time 8    Period Weeks    Status On-going      PT LONG TERM GOAL #5   Title Pt will decrease worst neck pain as reported on NPRS by at least 2 points in order to demonstrate clinically significant reduction in back pain.    Baseline 06/10/20: worst: 8/10    Time 8    Period Weeks    Status New    Target Date 07/20/20      Additional Long Term Goals   Additional Long Term Goals Yes      PT LONG TERM GOAL #6   Title Patient reportedly 75% improvement in his neck pain symptoms in order to resume full function at home with less pain    Time 8    Period Weeks    Status New    Target Date 07/20/20      PT LONG TERM GOAL #7   Title Patient will improve pain-free cervical rotation to least 50 degrees bilaterally in order to drive his car without any increase in pain when turning his head    Baseline 06/10/20: R/L: 35/30    Time 8  Period Weeks    Status New    Target Date 07/20/20                 Plan - 06/14/20 1450    Clinical Impression Statement Patient arrived with good motivation to participate with therapy.  Deferred trigger point dry needling today due to persistent soreness in bilateral upper traps from last therapy session.  Continue with  manual techniques for cervical mobility including passive accessory mobilizations as well as stretches.  Continued with soft tissue mobilization to bilateral suboccipitals, scalenes, and upper traps.  Patient encouraged to continue HEP and follow-up as scheduled. Pt will benefit from PT services to address deficits in strength, pain, and mobility in order to return to full function at home with less pain.    Personal Factors and Comorbidities Age;Comorbidity 3+;Education;Past/Current Experience;Time since onset of injury/illness/exacerbation    Comorbidities spinal cord stimulator, paroxysmal A-fib, DM-Type 2, HTN, chronic kidney disease.    Examination-Activity Limitations Bed Mobility;Stand;Locomotion Level;Toileting;Bend;Reach Overhead;Carry;Squat;Hygiene/Grooming;Stairs    Examination-Participation Restrictions Occupation;Community Activity;Yard Work    Stability/Clinical Decision Making Unstable/Unpredictable    Rehab Potential Fair    PT Frequency 2x / week    PT Duration 8 weeks    PT Treatment/Interventions ADLs/Self Care Home Management;Moist Heat;Gait training;Stair training;Therapeutic activities;Therapeutic exercise;Balance training;Neuromuscular re-education;Patient/family education;Manual techniques;Functional mobility training;Cryotherapy;Aquatic Therapy;Biofeedback;Canalith Repostioning;Electrical Stimulation;Iontophoresis 45m/ml Dexamethasone;Traction;Ultrasound;DME Instruction;Passive range of motion;Dry needling;Vestibular;Spinal Manipulations;Joint Manipulations    PT Next Visit Plan Progress HEP when appropriate, Improve LE strength and endurance, manaul techniques, TDN, and strengthening for the neck    PT Home Exercise Plan Continue as prescribed    Consulted and Agree with Plan of Care Patient           Patient will benefit from skilled therapeutic intervention in order to improve the following deficits and impairments:  Abnormal gait,Pain,Improper body mechanics,Decreased  mobility,Postural dysfunction,Decreased activity tolerance,Decreased endurance,Decreased range of motion,Decreased strength,Decreased balance,Difficulty walking,Impaired flexibility  Visit Diagnosis: Cervicalgia  Chronic bilateral low back pain without sciatica     Problem List Patient Active Problem List   Diagnosis Date Noted  . Cervical facet joint syndrome 04/08/2020  . Cervicalgia 04/08/2020  . Spinal cord stimulator status 12/11/2019  . History of 2019 novel coronavirus disease (COVID-19) 10/02/2019  . CKD (chronic kidney disease) stage 3, GFR 30-59 ml/min (HCC) 01/19/2019  . Acquired thrombophilia ([place] 01/19/2019  . 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 Postlaminectomy syndrome, lumbar region 01/16/2019  . History of fusion of lumbar spine (L2-L5) 01/16/2019  . Chronic radicular lumbar pain 01/16/2019  . HNP (herniated nucleus pulposus), lumbar 04/29/2018  . Advanced care planning/counseling discussion 09/28/2016  . Hyperlipidemia associated with type 2 diabetes mellitus ([place] 07/14/2015  . Anemia 06/30/2015  . 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 OSA (obstructive sleep apnea) 03/23/2015  . Type 2 diabetes mellitus with diabetic chronic kidney disease ([place] 01/05/2015  . Hypertension associated with diabetes ([place] 09/28/2014  . 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[place] 09/28/2014  . H/O prior ablation treatment 10/19/2011  . Paroxysmal atrial fibrillation (HCC) 10/19/2011   JPhillips GroutPT, DPT, GCS  Shariya Gaster 06/14/2020, 3:03 PM  [facility] [facility] [place] [address]Phone: [phone]  Fax:  [phone] Name: WElesa Hacker MRN: [id number] of Birth: [birth year]

## 2020-06-16 ENCOUNTER — Other Ambulatory Visit: Payer: Self-pay | Admitting: Nurse Practitioner

## 2020-06-18 NOTE — Patient Instructions (Incomplete)
TREATMENT   Manual Therapy  Upper trap, scalene, and rotation stretches45s x 2 each bilaterally; Gentle cervical traction20shold/20srelax x 3; Supine C2-C5 CPA and bilateral UPA,grade 1 through 2, 30 seconds per bout x 2 bouts per level each; STM to bilateral suboccipitals, scalenes, and upper trap; Suboccipital stretch 30s x 2;   Ther-ex  MET cervical rotation stretches 5s contract/5s stretch x 3 each direction, performed twice both directions; Repeated cervical retraction with overpressure 3s hold x 10; Isometric lateral flexion strengthening 5s hold x 5 each direction;  Pt educated throughout session about proper posture and technique with exercises. Improved exercise technique, movement at target joints, use of target muscles after min to mod verbal, visual, tactile cues.    Patient arrived with good motivation to participate with therapy.  Deferred trigger point dry needling today due to persistent soreness in bilateral upper traps from last therapy session.  Continue with manual techniques for cervical mobility including passive accessory mobilizations as well as stretches.  Continued with soft tissue mobilization to bilateral suboccipitals, scalenes, and upper traps.  Patient encouraged to continue HEP and follow-up as scheduled. Pt will benefit from PT services to address deficits in strength, pain, and mobility in order to return to full function at home with less pain.

## 2020-06-21 ENCOUNTER — Ambulatory Visit: Payer: Medicare Other

## 2020-06-21 DIAGNOSIS — M545 Low back pain, unspecified: Secondary | ICD-10-CM

## 2020-06-21 DIAGNOSIS — M542 Cervicalgia: Secondary | ICD-10-CM

## 2020-06-23 NOTE — Patient Instructions (Incomplete)
TREATMENT   Manual Therapy  Upper trap, scalene, and rotation stretches45s x 2 each bilaterally; Gentle cervical traction20shold/20srelax x 3; Supine C2-C5 CPA and bilateral UPA,grade 1 through 2, 30 seconds per bout x 2 bouts per level each; STM to bilateral suboccipitals, scalenes, and upper trap; Suboccipital stretch 30s x 2;   Ther-ex  MET cervical rotation stretches 5s contract/5s stretch x 3 each direction, performed twice both directions; Repeated cervical retraction with overpressure 3s hold x 10; Isometric lateral flexion strengthening 5s hold x 5 each direction;  Pt educated throughout session about proper posture and technique with exercises. Improved exercise technique, movement at target joints, use of target muscles after min to mod verbal, visual, tactile cues.    Patient arrived with good motivation to participate with therapy.  Deferred trigger point dry needling today due to persistent soreness in bilateral upper traps from last therapy session.  Continue with manual techniques for cervical mobility including passive accessory mobilizations as well as stretches.  Continued with soft tissue mobilization to bilateral suboccipitals, scalenes, and upper traps.  Patient encouraged to continue HEP and follow-up as scheduled. Pt will benefit from PT services to address deficits in strength, pain, and mobility in order to return to full function at home with less pain.   

## 2020-06-24 ENCOUNTER — Ambulatory Visit: Payer: Medicare Other

## 2020-06-24 ENCOUNTER — Other Ambulatory Visit: Payer: Self-pay

## 2020-06-24 DIAGNOSIS — M542 Cervicalgia: Secondary | ICD-10-CM | POA: Diagnosis not present

## 2020-06-24 NOTE — Therapy (Signed)
[facility] [address] [place], [place], [address] Phone: [phone]   Fax:  [phone]  Physical Therapy Treatment  Patient Details  Name: Sean Reyes. MRN: [id number] Date of Birth: Apr 03, 1940 Referring Provider (PT): Gillis Santa, MD   Encounter Date: 06/24/2020   PT End of Session - 06/24/20 1027    Visit Number 29    Number of Visits 60    Date for PT Re-Evaluation 07/20/20    Authorization Type eval: 01/01/20    PT Start Time 1017    PT Stop Time 1100    PT Time Calculation (min) 43 min    Activity Tolerance Patient tolerated treatment well    Behavior During Therapy [facility] for tasks assessed/performed           Past Medical History:  Diagnosis Date  . Anemia   . Anxiety   . Arthritis   . Atrial fibrillation ([place])   . Cataracts, bilateral   . Complication of anesthesia    pt reports low BP's after surgery at [facility] and difficulty awakening  . Depression   . Diabetes ([place])    dx 6-8 yrs ago  . Dysrhythmia    a-fib  . GERD (gastroesophageal reflux disease)    OCC TAKES ALKA SELTZER  . History of kidney stones    10-15 yrs ago  . 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 Hyperlipidemia   . Hypertension   . Nocturia   . S/P ablation of atrial fibrillation    Ablative therapy  . Sleep apnea    CPAP   . Tachycardia, unspecified     Past Surgical History:  Procedure Laterality Date  . ABLATION    . ANTERIOR LAT LUMBAR FUSION N/A 06/27/2017   Procedure: Anterior Lateral Lumbar Interbody  Fusion - Lumbar Two-Lumbar Three - Lumbar Three-Lumbar Four, Posterior Lumbar Interbody Fusion Lumbar Four- Five;  Surgeon: Kary Kos, MD;  Location: [place];  Service: Neurosurgery;  Laterality: N/A;  Anterior Lateral Lumbar Interbody  Fusion - Lumbar Two-Lumbar Three - Lumbar Three-Lumbar Four, Posterior Lumbar Interbody Fusion Lumbar Four- Five  . BACK SURGERY    . CARDIOVERSION N/A 08/29/2018    Procedure: CARDIOVERSION;  Surgeon: Corey Skains, MD;  Location: [facility];  Service: Cardiovascular;  Laterality: N/A;  . CARDIOVERSION N/A 09/24/2018   Procedure: CARDIOVERSION;  Surgeon: Corey Skains, MD;  Location: [facility];  Service: Cardiovascular;  Laterality: N/A;  . COLONOSCOPY WITH PROPOFOL N/A 10/05/2015   Procedure: COLONOSCOPY WITH PROPOFOL;  Surgeon: Lollie Sails, MD;  Location: [facility] ENDOSCOPY;  Service: Endoscopy;  Laterality: N/A;  . ESOPHAGOGASTRODUODENOSCOPY (EGD) WITH PROPOFOL N/A 04/01/2018   Procedure: ESOPHAGOGASTRODUODENOSCOPY (EGD) WITH PROPOFOL;  Surgeon: Lollie Sails, MD;  Location: [facility] ENDOSCOPY;  Service: Endoscopy;  Laterality: N/A;  . HERNIA REPAIR    . JOINT REPLACEMENT Bilateral    hips  RT+  LEFT X2   . LUMBAR LAMINECTOMY/DECOMPRESSION MICRODISCECTOMY Left 09/13/2016   Procedure: Microdiscectomy - Lumbar two-three,  Lumbar three- - left;  Surgeon: Kary Kos, MD;  Location: [place];  Service: Neurosurgery;  Laterality: Left;  . SPINAL CORD STIMULATOR INSERTION  07/08/2019  . TONSILLECTOMY      There were no vitals filed for this visit.   Subjective Assessment - 06/24/20 1025    Subjective Pt reports that he is doing well today. He is not having any resting neck pain currently but still notices pain when he  turns his head.  He rates his low back pain as a 4/10 upon arrival. He would like to focus his visit on his leg weakness today. No specific questions upon arrival.    Pertinent History Pt is a 80 y.o. male referred to PT for LBP. Pt has extensive PMH of failed back surgical syndrome, postlaminectomy syndrome, chronic radicular pain, hx of lumbar fusion spine, spinal cord stimulator, paroxysmal A-fib, DM-Type 2, HTN, chronic kidney disease. Pt reports 4 fusions on lumbar spine due to protruding disks which was ~2 years ago. Pt reports that his spine stimulator has started to help some but his biggest issue is his decreased strength in his spine,  legs, and core. In sitting pt has 0/10 NPS. With household distance pt has 4-5/10 NPS but is instantly relieved with sitting. With long walks, from the clinic to the car (100 yds)  8-9/10 NPS. The pain is described as dull pain. Pt denies radicular symptoms. [name] LBP is located acros his belt line right above his buttocks. Pt reports difficulty with his balance and asc/desc stairs but at home he uses no AD at home. Pt does report a fall 1.5 months ago with his cane and fell forward onto his head where he reports he could not catch himself going forward where he hit his head. This is his only fall with no injury. [name] big goal with PT is to be able to indep replace your deck. 06/10/20: Pt reports that around the first of the year he started having upper neck pain bilaterally. No known trauma. Pt states that he had a similar pain in the past but it self-resolved. No history of cervical surgeries. He reports pain most notably when trying to turn his head. No radicular symptoms into either shoulder or UE. He had cervical plain film radiographs on 04/16/20 which showed degenerative changes but otherwise no significant findings. He saw [place] on 04/19/20 prescribed a low-dose Medrol Dosepak and order was sent for physical therapy. At that time they also discussed need for possible cervical spine steroid injections. Pt reports that his pain has improved since that time but still persists.    How long can you sit comfortably? unlimited    How long can you stand comfortably? 4-5 min    How long can you walk comfortably? 4-5 min    Patient Stated Goals Indep rebuild deck.    Currently in Pain? Yes    Pain Score 4     Pain Location Back    Pain Orientation Lower    Pain Descriptors / Indicators Aching    Pain Type Chronic pain    Pain Onset More than a month ago    Pain Frequency Intermittent               TREATMENT   Ther-ex  Hooklying marchesx 1 minute BLE; Hooklying lumbar rotation  knee rocking (very limited range of motion and no pain) x 1 minute; Hooklyingadductorsqueezewith manual resistance from therapistx 20BLE; Hooklying clams with manual resistance from therapist x 20 BLE; Hooklying bridgesx 20; SLR x20 RLE, attempted on LLE but only able to perform 5 reps before stopping due to back pain; Manually resisted single leg press x 20 on each side; Single knee to chest stretch x 45s BLE; Sit to stand from regular height chair, no UE assist, holding 5# dumbbell 2 x 10; Seated alternating LAQ with 4# ankle weights x 1 minute; Seated HS curls with green tband x 30s on each side;  Pt educated throughout session about proper posture and technique with exercises. Improved exercise technique, movement at target joints, use of target muscles after min to mod verbal, visual, tactile cues.    Patient arrived with good motivation to participate with therapy.  Today's session focused on LE strengthening. Pt reports increase in pain again with hooklying L SLR so discontinued. Otherwise he is able to perform all hooklying and seated exercises without an increase in pain. He does fatigue with sit to stands. Rest breaks provided throughout session. Patient encouraged to continue HEP and follow-up as scheduled. Pt will benefit from PT services to address deficits in strength, pain, and mobility in order to return to full function at home with less pain.                             PT Long Term Goals - 06/11/20 1039      PT LONG TERM GOAL #1   Title Pt will improve FOTO score to target goal (49) to demonstrate clinically significant improvement in functional mobility.    Baseline 01/06/20: 38; 01/29/20: 45; 04/08/20: 44; 05/25/20: 36    Time 8    Period Weeks    Status Partially Met      PT LONG TERM GOAL #2   Title Pt will improve 5xSTS score by at least 5 sec with no UE support to demonstrate clinically significant improvement in BLE strength.     Baseline 11/18: 27 sec with BUE suppport, 01/29/20: 15.9s, 04/08/20: 13.73 sec; 05/25/20: 15.3s    Time 8    Period Weeks    Status Achieved      PT LONG TERM GOAL #3   Title Pt will improve 2 MWT with no AD by 40' to demonstrate clinically significant improvement in endurance to improve household and community ambulation.    Baseline 11/18: 175' no AD; 01/29/20: Deferred due to elevated resting BP;  04/08/20: 307' no AD; 05/25/20: 280' no AD    Time 8    Period Weeks    Status Achieved      PT LONG TERM GOAL #4   Title Pt will ambulate with LRAD with normalized gait pattern > 5 min to reduce tripping hazard with shuffling gait.    Baseline 11/18: Pt amb with single LSC and shuffling gait after 2 min due to fatigue, 05/25/20: Pt continues to walk with shuffling pattern and limited by fatigue    Time 8    Period Weeks    Status On-going      PT LONG TERM GOAL #5   Title Pt will decrease worst neck pain as reported on NPRS by at least 2 points in order to demonstrate clinically significant reduction in back pain.    Baseline 06/10/20: worst: 8/10    Time 8    Period Weeks    Status New    Target Date 07/20/20      Additional Long Term Goals   Additional Long Term Goals Yes      PT LONG TERM GOAL #6   Title Patient reportedly 75% improvement in his neck pain symptoms in order to resume full function at home with less pain    Time 8    Period Weeks    Status New    Target Date 07/20/20      PT LONG TERM GOAL #7   Title Patient will improve pain-free cervical rotation to least 50 degrees bilaterally in order  to drive his car without any increase in pain when turning his head    Baseline 06/10/20: R/L: 35/30    Time 8    Period Weeks    Status New    Target Date 07/20/20                 Plan - 06/24/20 1029    Clinical Impression Statement Patient arrived with good motivation to participate with therapy.  Today's session focused on LE strengthening. Pt reports increase in  pain again with hooklying L SLR so discontinued. Otherwise he is able to perform all hooklying and seated exercises without an increase in pain. He does fatigue with sit to stands. Rest breaks provided throughout session. Patient encouraged to continue HEP and follow-up as scheduled. Pt will benefit from PT services to address deficits in strength, pain, and mobility in order to return to full function at home with less pain.    Personal Factors and Comorbidities Age;Comorbidity 3+;Education;Past/Current Experience;Time since onset of injury/illness/exacerbation    Comorbidities spinal cord stimulator, paroxysmal A-fib, DM-Type 2, HTN, chronic kidney disease.    Examination-Activity Limitations Bed Mobility;Stand;Locomotion Level;Toileting;Bend;Reach Overhead;Carry;Squat;Hygiene/Grooming;Stairs    Examination-Participation Restrictions Occupation;Community Activity;Yard Work    Stability/Clinical Decision Making Unstable/Unpredictable    Rehab Potential Fair    PT Frequency 2x / week    PT Duration 8 weeks    PT Treatment/Interventions ADLs/Self Care Home Management;Moist Heat;Gait training;Stair training;Therapeutic activities;Therapeutic exercise;Balance training;Neuromuscular re-education;Patient/family education;Manual techniques;Functional mobility training;Cryotherapy;Aquatic Therapy;Biofeedback;Canalith Repostioning;Electrical Stimulation;Iontophoresis 31m/ml Dexamethasone;Traction;Ultrasound;DME Instruction;Passive range of motion;Dry needling;Vestibular;Spinal Manipulations;Joint Manipulations    PT Next Visit Plan Progress note, Progress HEP when appropriate, Improve LE strength and endurance, manaul techniques, TDN, and strengthening for the neck    PT Home Exercise Plan Continue as prescribed    Consulted and Agree with Plan of Care Patient           Patient will benefit from skilled therapeutic intervention in order to improve the following deficits and impairments:  Abnormal  gait,Pain,Improper body mechanics,Decreased mobility,Postural dysfunction,Decreased activity tolerance,Decreased endurance,Decreased range of motion,Decreased strength,Decreased balance,Difficulty walking,Impaired flexibility  Visit Diagnosis: Cervicalgia     Problem List Patient Active Problem List   Diagnosis Date Noted  . Cervical facet joint syndrome 04/08/2020  . Cervicalgia 04/08/2020  . Spinal cord stimulator status 12/11/2019  . History of 2019 novel coronavirus disease (COVID-19) 10/02/2019  . CKD (chronic kidney disease) stage 3, GFR 30-59 ml/min (HCC) 01/19/2019  . Acquired thrombophilia ([place] 01/19/2019  . 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 Postlaminectomy syndrome, lumbar region 01/16/2019  . History of fusion of lumbar spine (L2-L5) 01/16/2019  . Chronic radicular lumbar pain 01/16/2019  . HNP (herniated nucleus pulposus), lumbar 04/29/2018  . Advanced care planning/counseling discussion 09/28/2016  . Hyperlipidemia associated with type 2 diabetes mellitus ([place] 07/14/2015  . Anemia 06/30/2015  . 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 OSA (obstructive sleep apnea) 03/23/2015  . Type 2 diabetes mellitus with diabetic chronic kidney disease ([place] 01/05/2015  . Hypertension associated with diabetes ([place] 09/28/2014  . 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[place] 09/28/2014  . H/O prior ablation treatment 10/19/2011  . Paroxysmal atrial fibrillation (HCC) 10/19/2011   JLyndel SafeHuprich PT, DPT, GCS  Dynasia Kercheval 06/24/2020, 1:10 PM  [place] [facility] [place] [address]Phone: [phone]  Fax:  [phone] Name: WElesa Hacker MRN: [id number] of Birth: 6October 07, 1942

## 2020-06-28 ENCOUNTER — Ambulatory Visit: Payer: Medicare Other

## 2020-06-28 ENCOUNTER — Other Ambulatory Visit: Payer: Self-pay

## 2020-06-28 DIAGNOSIS — G8929 Other chronic pain: Secondary | ICD-10-CM

## 2020-06-28 DIAGNOSIS — M542 Cervicalgia: Secondary | ICD-10-CM

## 2020-06-28 DIAGNOSIS — M545 Low back pain, unspecified: Secondary | ICD-10-CM

## 2020-06-28 NOTE — Therapy (Signed)
[facility] [address]. [place], [place], [address] Phone: [phone]   Fax:  [phone]  Physical Therapy Progress Note   Dates of reporting period  04/26/20   to   06/28/20  Patient Details  Name: Sean Reyes. MRN: [id number] Date of Birth: 1940-03-09 Referring Provider (PT): Gillis Santa, MD   Encounter Date: 06/28/2020   PT End of Session - 06/28/20 1435    Visit Number 30    Number of Visits 93    Date for PT Re-Evaluation 07/20/20    Authorization Type eval: 01/01/20    PT Start Time 1415    PT Stop Time 1445    PT Time Calculation (min) 30 min    Activity Tolerance Patient tolerated treatment well    Behavior During Therapy [facility] for tasks assessed/performed           Past Medical History:  Diagnosis Date  . Anemia   . Anxiety   . Arthritis   . Atrial fibrillation ([place])   . Cataracts, bilateral   . Complication of anesthesia    pt reports low BP's after surgery at [facility] and difficulty awakening  . Depression   . Diabetes ([place])    dx 6-8 yrs ago  . Dysrhythmia    a-fib  . GERD (gastroesophageal reflux disease)    OCC TAKES ALKA SELTZER  . History of kidney stones    10-15 yrs ago  . 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 Hyperlipidemia   . Hypertension   . Nocturia   . S/P ablation of atrial fibrillation    Ablative therapy  . Sleep apnea    CPAP   . Tachycardia, unspecified     Past Surgical History:  Procedure Laterality Date  . ABLATION    . ANTERIOR LAT LUMBAR FUSION N/A 06/27/2017   Procedure: Anterior Lateral Lumbar Interbody  Fusion - Lumbar Two-Lumbar Three - Lumbar Three-Lumbar Four, Posterior Lumbar Interbody Fusion Lumbar Four- Five;  Surgeon: Kary Kos, MD;  Location: [place];  Service: Neurosurgery;  Laterality: N/A;  Anterior Lateral Lumbar Interbody  Fusion - Lumbar Two-Lumbar Three - Lumbar Three-Lumbar Four, Posterior Lumbar Interbody Fusion Lumbar Four- Five   . BACK SURGERY    . CARDIOVERSION N/A 08/29/2018   Procedure: CARDIOVERSION;  Surgeon: Corey Skains, MD;  Location: [facility];  Service: Cardiovascular;  Laterality: N/A;  . CARDIOVERSION N/A 09/24/2018   Procedure: CARDIOVERSION;  Surgeon: Corey Skains, MD;  Location: [facility];  Service: Cardiovascular;  Laterality: N/A;  . COLONOSCOPY WITH PROPOFOL N/A 10/05/2015   Procedure: COLONOSCOPY WITH PROPOFOL;  Surgeon: Lollie Sails, MD;  Location: [facility] ENDOSCOPY;  Service: Endoscopy;  Laterality: N/A;  . ESOPHAGOGASTRODUODENOSCOPY (EGD) WITH PROPOFOL N/A 04/01/2018   Procedure: ESOPHAGOGASTRODUODENOSCOPY (EGD) WITH PROPOFOL;  Surgeon: Lollie Sails, MD;  Location[facility] ENDOSCOPY;  Service: Endoscopy;  Laterality: N/A;  . HERNIA REPAIR    . JOINT REPLACEMENT Bilateral    hips  RT+  LEFT X2   . LUMBAR LAMINECTOMY/DECOMPRESSION MICRODISCECTOMY Left 09/13/2016   Procedure: Microdiscectomy - Lumbar two-three,  Lumbar three- - left;  Surgeon: Kary Kos, MD;  Location: [place];  Service: Neurosurgery;  Laterality: Left;  . SPINAL CORD STIMULATOR INSERTION  07/08/2019  . TONSILLECTOMY      There were no vitals filed for this visit.   Subjective Assessment - 06/29/20 1519    Subjective Pt reports that he is doing alright today.  He is not having any resting neck pain currently but still notices pain when he turns his head.  His low back remains painful despite attempts to change the settings of his spinal cord stimulator. He doesn't rate his back pain upon arrival today. No specific questions upon arrival.    Pertinent History Pt is a 80 y.o. male referred to PT for LBP. Pt has extensive PMH of failed back surgical syndrome, postlaminectomy syndrome, chronic radicular pain, hx of lumbar fusion spine, spinal cord stimulator, paroxysmal A-fib, DM-Type 2, HTN, chronic kidney disease. Pt reports 4 fusions on lumbar spine due to protruding disks which was ~2 years ago. Pt reports that his spine  stimulator has started to help some but his biggest issue is his decreased strength in his spine, legs, and core. In sitting pt has 0/10 NPS. With household distance pt has 4-5/10 NPS but is instantly relieved with sitting. With long walks, from the clinic to the car (100 yds)  8-9/10 NPS. The pain is described as dull pain. Pt denies radicular symptoms. [name] LBP is located acros his belt line right above his buttocks. Pt reports difficulty with his balance and asc/desc stairs but at home he uses no AD at home. Pt does report a fall 1.5 months ago with his cane and fell forward onto his head where he reports he could not catch himself going forward where he hit his head. This is his only fall with no injury. [name] big goal with PT is to be able to indep replace your deck. 06/10/20: Pt reports that around the first of the year he started having upper neck pain bilaterally. No known trauma. Pt states that he had a similar pain in the past but it self-resolved. No history of cervical surgeries. He reports pain most notably when trying to turn his head. No radicular symptoms into either shoulder or UE. He had cervical plain film radiographs on 04/16/20 which showed degenerative changes but otherwise no significant findings. He saw [place] on 04/19/20 prescribed a low-dose Medrol Dosepak and order was sent for physical therapy. At that time they also discussed need for possible cervical spine steroid injections. Pt reports that his pain has improved since that time but still persists.    How long can you sit comfortably? unlimited    How long can you stand comfortably? 4-5 min    How long can you walk comfortably? 4-5 min    Patient Stated Goals Indep rebuild deck.    Currently in Pain? Yes    Pain Score --   Doesn't rate today   Pain Location Back    Pain Orientation Lower    Pain Descriptors / Indicators Aching    Pain Type Chronic pain    Pain Onset More than a month ago    Pain Frequency  Intermittent               TREATMENT   Ther-ex AROM R/L 32* Cervical Flexion 10* Cervical Extension 15*/18* Cervical Lateral Flexion 35/43* Cervical Rotation *Indicates pain, overpressure performed unless otherwise indicated  20% improvement in neck pain since starting therapy; Worst: 8/10; 5TSTS: 15.6s FOTO: 40 2MWT: 280';  Hooklying marchesx 1 minute BLE; Hooklying lumbar rotation knee rocking (very limited range of motion and no pain) x 1 minute; Hooklyingadductorsqueezewith manual resistance from therapistx 20BLE; Hooklying clams with manual resistance from therapist x 20 BLE; Hooklying bridgesx 20; Sit to stand from regular height chair, no UE assist, holding 6# med ball with  overhead press 2 x 10;   Pt educated throughout session about proper posture and technique with exercises. Improved exercise technique, movement at target joints, use of target muscles after min to mod verbal, visual, tactile cues.   Patient arrived with good motivation to participate with therapy however he was late so session was abbreviated. Updated outcome measures with patient today. His cervical range of motion has improved compared to last measures. He reports approximately 20% improvement in his neck pain since it was added to his therapy plan of care. His 5TSTS and 2MWT are essentially unchanged. His FOTO has improved compared to when it was last updated however he has still not met his goal. Additional time in session spent on LE strengthening. Rest breaks provided throughout session.Patient encouraged to continue HEP and follow-up as scheduled. Pt will benefit from PT services to address deficits in strength,pain, and mobility in order to return to full function at homewith less pain                   PT Long Term Goals - 06/29/20 1524      PT LONG TERM GOAL #1   Title Pt will improve FOTO score to target goal (49) to demonstrate clinically  significant improvement in functional mobility.    Baseline 01/06/20: 38; 01/29/20: 45; 04/08/20: 44; 05/25/20: 36; 06/28/20: 40    Time 8    Period Weeks    Status Partially Met    Target Date 07/20/20      PT LONG TERM GOAL #2   Title Pt will improve 5xSTS score by at least 5 sec with no UE support to demonstrate clinically significant improvement in BLE strength.    Baseline 11/18: 27 sec with BUE suppport, 01/29/20: 15.9s, 04/08/20: 13.73 sec; 05/25/20: 15.3s; 06/28/20: 15.6s    Time 8    Period Weeks    Status Achieved      PT LONG TERM GOAL #3   Title Pt will improve 2 MWT with no AD by 40' to demonstrate clinically significant improvement in endurance to improve household and community ambulation.    Baseline 11/18: 175' no AD; 01/29/20: Deferred due to elevated resting BP;  04/08/20: 307' no AD; 05/25/20: 280' no AD; 06/28/20: 280' no AD    Time 8    Period Weeks    Status Achieved      PT LONG TERM GOAL #4   Title Pt will ambulate with LRAD with normalized gait pattern > 5 min to reduce tripping hazard with shuffling gait.    Baseline 11/18: Pt amb with single LSC and shuffling gait after 2 min due to fatigue, 05/25/20: Pt continues to walk with shuffling pattern and limited by fatigue; 06/28/20: unchanged    Time 8    Period Weeks    Status On-going    Target Date 07/20/20      PT LONG TERM GOAL #5   Title Pt will decrease worst neck pain as reported on NPRS by at least 2 points in order to demonstrate clinically significant reduction in back pain.    Baseline 06/10/20: worst: 8/10; 06/28/20: 8/10    Time 8    Period Weeks    Status On-going    Target Date 07/20/20      Additional Long Term Goals   Additional Long Term Goals Yes      PT LONG TERM GOAL #6   Title Patient reportedly 75% improvement in his neck pain symptoms in order to resume full  function at home with less pain    Baseline 06/28/20: 20% improvement    Time 8    Period Weeks    Status Partially Met    Target  Date 07/20/20      PT LONG TERM GOAL #7   Title Patient will improve pain-free cervical rotation to least 50 degrees bilaterally in order to drive his car without any increase in pain when turning his head    Baseline 06/10/20: R/L: 35/30; 06/28/20: 35/43    Time 8    Period Weeks    Status Partially Met    Target Date 07/20/20                 Plan - 06/28/20 1439    Clinical Impression Statement Patient arrived with good motivation to participate with therapy however he was late so session was abbreviated. Updated outcome measures with patient today. His cervical range of motion has improved compared to last measures. He reports approximately 20% improvement in his neck pain since it was added to his therapy plan of care. His 5TSTS and 2MWT are essentially unchanged. His FOTO has improved compared to when it was last updated however he has still not met his goal. Additional time in session spent on LE strengthening. Rest breaks provided throughout session. Patient encouraged to continue HEP and follow-up as scheduled. Pt will benefit from PT services to address deficits in strength, pain, and mobility in order to return to full function at home with less pain    Personal Factors and Comorbidities Age;Comorbidity 3+;Education;Past/Current Experience;Time since onset of injury/illness/exacerbation    Comorbidities spinal cord stimulator, paroxysmal A-fib, DM-Type 2, HTN, chronic kidney disease.    Examination-Activity Limitations Bed Mobility;Stand;Locomotion Level;Toileting;Bend;Reach Overhead;Carry;Squat;Hygiene/Grooming;Stairs    Examination-Participation Restrictions Occupation;Community Activity;Yard Work    Stability/Clinical Decision Making Unstable/Unpredictable    Rehab Potential Fair    PT Frequency 2x / week    PT Duration 8 weeks    PT Treatment/Interventions ADLs/Self Care Home Management;Moist Heat;Gait training;Stair training;Therapeutic activities;Therapeutic  exercise;Balance training;Neuromuscular re-education;Patient/family education;Manual techniques;Functional mobility training;Cryotherapy;Aquatic Therapy;Biofeedback;Canalith Repostioning;Electrical Stimulation;Iontophoresis 38m/ml Dexamethasone;Traction;Ultrasound;DME Instruction;Passive range of motion;Dry needling;Vestibular;Spinal Manipulations;Joint Manipulations    PT Next Visit Plan Progress note, Progress HEP when appropriate, Improve LE strength and endurance, manaul techniques, TDN, and strengthening for the neck    PT Home Exercise Plan Continue as prescribed    Consulted and Agree with Plan of Care Patient           Patient will benefit from skilled therapeutic intervention in order to improve the following deficits and impairments:  Abnormal gait,Pain,Improper body mechanics,Decreased mobility,Postural dysfunction,Decreased activity tolerance,Decreased endurance,Decreased range of motion,Decreased strength,Decreased balance,Difficulty walking,Impaired flexibility  Visit Diagnosis: Cervicalgia  Chronic bilateral low back pain without sciatica     Problem List Patient Active Problem List   Diagnosis Date Noted  . Cervical facet joint syndrome 04/08/2020  . Cervicalgia 04/08/2020  . Spinal cord stimulator status 12/11/2019  . History of 2019 novel coronavirus disease (COVID-19) 10/02/2019  . CKD (chronic kidney disease) stage 3, GFR 30-59 ml/min (HCC) 01/19/2019  . [place]quired [place] 01/19/2019  . 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 Postlaminectomy syndrome, lumbar region 01/16/2019  . History of fusion of lumbar spine (L2-L5) 01/16/2019  . Chronic radicular lumbar pain 01/16/2019  . HNP (herniated nucleus pulposus), lumbar 04/29/2018  . Advanced care planning/counseling discussion 09/28/2016  . Hyperlipidemia associated with type 2 diabetes mellitus ([place] 07/14/2015  . Anemia 06/30/2015  . 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  OSA (obstructive sleep apnea) 03/23/2015  . Type 2 diabetes mellitus with diabetic chronic kidney disease ([place]) 01/05/2015  . Hypertension associated with diabetes ([place]) 09/28/2014  . 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[place]) 09/28/2014  . H/O prior ablation treatment 10/19/2011  . Paroxysmal atrial fibrillation ([place]) 10/19/2011   Phillips Grout PT, DPT, GCS  Noemy Hallmon 06/29/2020, 3:31 PM  [facility] [address] [place], [place], [address] Phone: [phone]   Fax:  [phone]  Name: Sean Reyes. MRN: [id number] Date of Birth: [birth year]

## 2020-07-05 ENCOUNTER — Ambulatory Visit: Payer: Medicare Other

## 2020-07-05 NOTE — Patient Instructions (Incomplete)
TREATMENT  Ther-ex Hooklying marchesx 1 minute BLE; Hooklying lumbar rotation knee rocking (very limited range of motion and no pain) x 1 minute; Hooklyingadductorsqueezewith manual resistance from therapistx 20BLE; Hooklying clams with manual resistance from therapist x 20 BLE; Hooklying bridgesx 20; SLR x20 RLE, attempted on LLE but only able to perform5reps before stopping due to back pain; Manually resisted single leg press x 20 on each side; Single knee to chest stretchx 45s BLE; Sit to stand from regular height chair, no UE assist, 6# overhead med ball press 2 x 10; Seated alternating LAQ with 4# ankle weights x 1 minute; Seated HS curls with green tband x 30s on each side;   Pt educated throughout session about proper posture and technique with exercises. Improved exercise technique, movement at target joints, use of target muscles after min to mod verbal, visual, tactile cues.   Patient arrived with good motivation to participate with therapy however he was late so session was abbreviated. Updated outcome measures with patient today. His cervical range of motion has improved compared to last measures. He reports approximately 20% improvement in his neck pain since it was added to his therapy plan of care. His 5TSTS and 2MWT are essentially unchanged. His FOTO has improved compared to when it was last updated however he has still not met his goal. Additional time in session spent on LE strengthening. Rest breaks provided throughout session.Patient encouraged to continue HEP and follow-up as scheduled. Pt will benefit from PT services to address deficits in strength,pain, and mobility in order to return to full function at homewith less pain

## 2020-07-06 ENCOUNTER — Encounter: Payer: Self-pay | Admitting: Student in an Organized Health Care Education/Training Program

## 2020-07-06 ENCOUNTER — Ambulatory Visit
Payer: Medicare Other | Attending: Student in an Organized Health Care Education/Training Program | Admitting: Student in an Organized Health Care Education/Training Program

## 2020-07-06 ENCOUNTER — Other Ambulatory Visit: Payer: Self-pay

## 2020-07-06 VITALS — BP 130/78 | HR 58 | Temp 97.7°F | Resp 18 | Ht 73.0 in | Wt 194.0 lb

## 2020-07-06 DIAGNOSIS — Z9689 Presence of other specified functional implants: Secondary | ICD-10-CM

## 2020-07-06 DIAGNOSIS — M542 Cervicalgia: Secondary | ICD-10-CM

## 2020-07-06 DIAGNOSIS — M47812 Spondylosis without myelopathy or radiculopathy, cervical region: Secondary | ICD-10-CM | POA: Diagnosis not present

## 2020-07-06 DIAGNOSIS — Z981 Arthrodesis status: Secondary | ICD-10-CM

## 2020-07-06 DIAGNOSIS — G894 Chronic pain syndrome: Secondary | ICD-10-CM | POA: Insufficient documentation

## 2020-07-06 NOTE — Progress Notes (Signed)
PROVIDER NOTE: Information contained herein reflects review and annotations entered in association with encounter. Interpretation of such information and data should be left to medically-trained personnel. Information provided to patient can be located elsewhere in the medical record under "Patient Instructions". Document created using STT-dictation technology, any transcriptional errors that may result from process are unintentional.    Patient: Sean Reyes.  Service Category: E/M  Provider: Gillis Santa, MD  DOB: [birth year]  DOS: 07/06/2020  Specialty: Interventional Pain Management  MRN: [id number]  Setting: Ambulatory outpatient  PCP: [name], NP  Type: Established Patient    Referring Provider: [name], NP  Location: Office  Delivery: Face-to-face     HPI  [name]., a 80 y.o. 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 [name] primary complain today is Neck Pain Last encounter: My last encounter with him was on 04/08/2020. Pertinent problems: [name] has H/O prior ablation treatment; Paroxysmal atrial fibrillation ([place]); Type 2 diabetes mellitus with diabetic chronic kidney disease ([place]); HNP (herniated nucleus pulposus), lumbar; Failed back surgical syndrome; Postlaminectomy syndrome, lumbar region; History of fusion of lumbar spine (L2-L5); Chronic radicular lumbar pain; and Spinal cord stimulator status on their pertinent problem list. Pain Assessment: Severity of Chronic pain is reported as a 2 /10. Location: Neck  /denies. Onset: More than a month ago. Quality: Aching,Sore,Discomfort (Pulling, grabbing). Timing: Intermittent. Modifying factor(s): PT, rest. Vitals:  height is 6' 1"  (1.854 m) and weight is 194 lb (88 kg). His temperature is 97.7 F (36.5 C). His blood pressure is 130/78 and his pulse is 58 (abnormal). His respiration is 18.   Reason for encounter: worsening of previously known (established) problem     Patient follows up today for increased neck pain.  He describes it as severe pressure along his neck.  He has been working with physical therapy but states that has amplified his neck pain.  We have obtained cervical spinal x-rays which show severe cervical degenerative disc disease as well as facet joint syndrome.  Given increased neck pain that is worsened over the last 3 months, I would like to obtain a cervical MRI without contrast for further work-up.  Pending results, will discuss treatment plan which may include cervical epidural steroid injection or diagnostic cervical facet medial branch nerve blocks.  ROS  Constitutional: Denies any fever or chills Gastrointestinal: No reported hemesis, hematochezia, vomiting, or acute GI distress Musculoskeletal: Neck pain Neurological: No reported episodes of acute onset apraxia, aphasia, dysarthria, agnosia, amnesia, paralysis, loss of coordination, or loss of consciousness  Medication Review  acetaminophen, amLODIPine Besylate, apixaban, benazepril, finasteride, fluticasone, gabapentin, metFORMIN, metoprolol succinate, pantoprazole, propafenone, rosuvastatin, solifenacin, and tamsulosin  History Review  Allergy: [name] is allergic to levaquin [levofloxacin in d5w], shellfish allergy, amiodarone, and adhesive [tape]. Drug: [name]  reports no history of drug use. Alcohol:  reports no history of alcohol use. Tobacco:  reports that he has never smoked. He has never used smokeless tobacco. Social: Sean Reyes  reports that he has never smoked. He has never used smokeless tobacco. He reports that he does not drink alcohol and does not use drugs. Medical:  has a past medical history of Anemia, Anxiety, Arthritis, Atrial fibrillation ([place]), Cataracts, bilateral, Complication of anesthesia, Depression, Diabetes ([place]), Dysrhythmia, GERD (gastroesophageal reflux disease), History of kidney stones, HOH (hard of hearing), Hyperlipidemia, Hypertension,  Nocturia, S/P ablation of atrial fibrillation, Sleep apnea, and Tachycardia, unspecified. Surgical: [name]  has a past surgical history that includes Tonsillectomy; Hernia repair; Ablation; Colonoscopy with propofol (N/A, 10/05/2015); Lumbar laminectomy/decompression microdiscectomy (Left, 09/13/2016); Anterior lat lumbar fusion (N/A, 06/27/2017); Joint replacement (Bilateral); Back surgery; Esophagogastroduodenoscopy (egd) with propofol (N/A, 04/01/2018); Cardioversion (N/A, 08/29/2018); Cardioversion (N/A, 09/24/2018); and Spinal cord stimulator insertion (07/08/2019). Family: family history includes Brain cancer in his mother.  Laboratory Chemistry Profile   Renal Lab Results  Component Value Date   BUN 20 06/10/2020   CREATININE 1.29 (H) 06/10/2020   BCR 16 06/10/2020   GFRAA 64 02/26/2020   GFRNONAA 55 (L) 02/26/2020     Hepatic Lab Results  Component Value Date   AST 16 06/10/2020   ALT 19 06/10/2020   ALBUMIN 4.1 06/10/2020   ALKPHOS 69 06/10/2020   LIPASE 25 12/19/2018     Electrolytes Lab Results  Component Value Date   NA 137 06/10/2020   K 4.5 06/10/2020   CL 98 06/10/2020   CALCIUM 9.6 06/10/2020   MG 2.1 07/23/2018     Bone Lab Results  Component Value Date   VD25OH 41.7 02/26/2020   TESTOFREE 6.0 (L) 09/01/2015   TESTOSTERONE 642 12/11/2016     Inflammation (CRP: Acute Phase) (ESR: Chronic Phase) No results found for: CRP, ESRSEDRATE, LATICACIDVEN     Note: Above Lab results reviewed.  Recent Imaging Review  MR LUMBAR SPINE WO CONTRAST CLINICAL DATA:  80 year old male with spinal cord stimulator. Recurrent back pain, leg weakness. Stimulator manufacture guidelines followed with no adverse events during imaging.  EXAM: MRI LUMBAR SPINE WITHOUT CONTRAST  TECHNIQUE: Multiplanar, multisequence MR imaging of the lumbar spine was performed. No intravenous contrast was administered.  COMPARISON:  Thoracic MRI the same day reported  separately.  Previous lumbar MRI 12/25/2017. Lumbar spine CT myelogram 11/07/2018.  FINDINGS: Segmentation: Normal, concordant with thoracic spine numbering today.  Alignment: Stable lumbar lordosis since 2019. Subtle chronic anterolisthesis of L4 on L5.  Vertebrae: Chronic hardware susceptibility artifact from L2 through L5, and addition of L5-S1 posterior and interbody fusion hardware since the 2019 MRI, stable from the prior CT myelogram.  Partially visible hip arthroplasty hardware artifact also.  No convincing lumbar marrow edema or acute osseous abnormality. Normal background bone marrow signal. Intact visible sacrum and SI joints.  Conus medullaris and cauda equina: Mild lower thoracic thecal sac susceptibility artifact related to spinal stimulator. Conus better visualized on thoracic MRI from the same day (please see that report). Fairly capacious lumbar spinal canal. Cauda equina nerve roots appear within normal limits.  Paraspinal and other soft tissues: Postoperative changes to the lumbar paraspinal soft tissues with no adverse features. Negative visible abdominal viscera. Diverticulosis of the large bowel in the pelvis.  Disc levels:  T12-L1: Interbody ankylosis via flowing endplate osteophyte, confirmed on the 2020 CT. Otherwise negative.  L1-L2: Subtle disc bulging. Mild facet and mild to moderate ligament flavum hypertrophy. No stenosis.  L2-L3:  Stable prior decompression and fusion. No stenosis.  L3-L4: Stable prior decompression and fusion. Mild endplate spurring. No convincing stenosis.  L4-L5: Prior decompression and fusion with further improved thecal sac patency compared to 2019. Residual foraminal disc osteophyte complex better demonstrated by CT. Mild left and mild to moderate right osseous L4 foraminal stenosis appears to be chronic.  L5-S1: Decompression and fusion, new from the prior MRI stable from the CT myelogram. Resolved spinal  stenosis. Lateral recess architectural distortion greater on the left. No convincing residual lateral recess stenosis. Residual endplate spurring. Up to mild bilateral L5 foraminal stenosis better  demonstrated by CT.  IMPRESSION: 1. Previous decompression and fusion from L2-L3 through L5-S1, stable from the 2020 CT myelogram. No spinal or convincing lateral recess stenosis at those levels. Occasional mild (moderate right L4 nerve level) osseous foraminal stenosis related to residual endplate spurring. 2. Interbody ankylosis T12-L1. But only mild degeneration at the unfused L1-L2 level, with no stenosis. 3. Mild susceptibility from thoracic spinal stimulator device at the lower thoracic spinal canal. See Thoracic MRI reported separately. 4. Diverticulosis of the large bowel in the pelvis.  Electronically Signed   By: Genevie Ann M.D.   On: 05/21/2020 06:40 MR THORACIC SPINE WO CONTRAST CLINICAL DATA:  80 year old male with spinal cord stimulator. Recurrent back pain, leg weakness.  Stimulator manufacture guidelines followed with no adverse events during imaging.  EXAM: MRI THORACIC SPINE WITHOUT CONTRAST  TECHNIQUE: Multiplanar, multisequence MR imaging of the thoracic spine was performed. No intravenous contrast was administered.  COMPARISON:  Lumbar MRI from the same day reported separately. Chest radiographs 04/29/2018.  FINDINGS: Limited cervical spine imaging: Relatively preserved cervical lordosis. Questionable mild anterolisthesis with facet degeneration at C4-C5.  Thoracic spine segmentation:  Appears to be normal.  Alignment: Preserved thoracic kyphosis. Subtle degenerative appearing anterolisthesis of T2 on T3, and similar retrolisthesis of T11 on T12.  Vertebrae: No marrow edema or evidence of acute osseous abnormality. Visualized bone marrow signal is within normal limits. Evidence of interbody ankylosis at T12-L1 related to anterior flowing  endplate osteophyte.  Cord: Generally capacious thoracic spinal canal.  No spinal cord signal abnormality is evident, although there is effaced ventral CSF space with some mild ventral cord mass effect related to disc disease, detailed below.  Susceptibility artifact in the posterior spinal canal compatible with spinal stimulator device seems to extend cephalad to the T8-T9 level. No spinal stimulator on the prior radiographs. No adverse features are evident.  Conus medullaris terminates at T12. Conus detail obscured by susceptibility artifact.  Paraspinal and other soft tissues: Negative.  Disc levels:  T1-T2: Mild foraminal endplate spurring on the right. Mild facet hypertrophy on that side. Mild to moderate right T1 foraminal stenosis.  T2-T3: Mild foraminal disc bulge and endplate spurring. No stenosis.  T3-T4: Negative.  T4-T5: Negative.  T5-T6: Negative.  T6-T7: Small right paracentral and slightly caudal disc extrusion (series 21, image 20). Effaced ventral CSF space and mild ventral cord mass effect on the right. But capacious spinal canal. No foraminal stenosis.  T7-T8: Moderate disc extrusion in the midline (series 21, image 23). Effaced ventral CSF space and mild ventral cord mass effect. Dorsal canal remains patent. No foraminal stenosis.  T8-T9: Small right paracentral disc protrusion (series 21, image 26). No stenosis.  T9-T10: Mostly anterior disc bulging and endplate spurring. Mild facet hypertrophy. No stenosis.  T10-T11: Circumferential disc bulge and endplate spurring with broad-based posterior component. Facet and ligament flavum hypertrophy. Susceptibility artifact in the posterior canal at this level. Borderline to mild spinal stenosis. Foramen appear normal.  T11-T12: Mild retrolisthesis. Posterior disc space loss. Circumferential disc osteophyte complex. Mild to moderate facet and ligament flavum hypertrophy. Posterior spinal canal  susceptibility artifact here. Borderline to mild spinal stenosis. No convincing foraminal stenosis.  T12-L1: Evidence of interbody ankylosis via anterior flowing endplate osteophyte (series 18, image 8). Otherwise negative.  IMPRESSION: 1. Mild susceptibility artifact in the posterior spinal canal from spinal stimulator device. This mildly obscures the conus and the spinal canal detail at T10-T11, T11-T12. 2. Mild degenerative retrolisthesis of T11 on T12. Subtle anterolisthesis of T2  on T3. Interbody ankylosis at T11-T12. No acute osseous abnormality. 3. Paracentral thoracic disc herniations T6-T7 through T8-T9, generally small although moderate at T7-T8. Associated mild ventral spinal cord mass effect, but capacious underlying thecal sac without significant spinal stenosis. No cord signal abnormality identified. 4. Borderline to mild multifactorial spinal stenosis at both T10-T11 and T11-T12.  Electronically Signed   By: Genevie Ann M.D.   On: 05/21/2020 06:24 Note: Reviewed       Narrative & Impression  CLINICAL DATA:  Cervical pain.  EXAM: CERVICAL SPINE - COMPLETE 4+ VIEW  COMPARISON:  None  FINDINGS: The cervical spine is well seen through the top of C7. The remainder C7 in the cervicothoracic junction not visualized. No malalignment. No fractures. Multilevel degenerative disc disease. Neural foramina remain patent. Uncovertebral degenerative changes are noted. No other abnormalities.  IMPRESSION: Degenerative changes.  No other abnormalities.   Electronically Signed   By: Dorise Bullion III M.D   On: 04/17/2020 15:32     Physical Exam  General appearance: Well nourished, well developed, and well hydrated. In no apparent acute distress Mental status: Alert, oriented x 3 (person, place, & time)       Respiratory: No evidence of acute respiratory distress Eyes: PERLA Vitals: BP 130/78   Pulse (!) 58   Temp 97.7 F (36.5 C)   Resp 18   Ht 6' 1"  (1.854  m)   Wt 194 lb (88 kg)   BMI 25.60 kg/m  BMI: Estimated body mass index is 25.6 kg/m as calculated from the following:   Height as of this encounter: 6' 1"  (1.854 m).   Weight as of this encounter: 194 lb (88 kg). Ideal: Ideal body weight: 79.9 kg (176 lb 2.4 oz) Adjusted ideal body weight: 83.1 kg (183 lb 4.6 oz)  Cervical Spine Area Exam  Skin & Axial Inspection: No masses, redness, edema, swelling, or associated skin lesions Alignment: Symmetrical Functional ROM: Pain restricted ROM, bilaterally Stability: No instability detected Muscle Tone/Strength: Functionally intact. No obvious neuro-muscular anomalies detected. Sensory (Neurological): Musculoskeletal pain pattern Palpation: No palpable anomalies             Upper Extremity (UE) Exam    Side: Right upper extremity  Side: Left upper extremity  Skin & Extremity Inspection: Skin color, temperature, and hair growth are WNL. No peripheral edema or cyanosis. No masses, redness, swelling, asymmetry, or associated skin lesions. No contractures.  Skin & Extremity Inspection: Skin color, temperature, and hair growth are WNL. No peripheral edema or cyanosis. No masses, redness, swelling, asymmetry, or associated skin lesions. No contractures.  Functional ROM: Pain restricted ROM for shoulder and elbow  Functional ROM: Pain restricted ROM for shoulder and elbow  Muscle Tone/Strength: Functionally intact. No obvious neuro-muscular anomalies detected.  Muscle Tone/Strength: Functionally intact. No obvious neuro-muscular anomalies detected.  Sensory (Neurological): Unimpaired          Sensory (Neurological): Unimpaired          Palpation: No palpable anomalies              Palpation: No palpable anomalies              Provocative Test(s):  Phalen's test: deferred Tinel's test: deferred Apley's scratch test (touch opposite shoulder):  Action 1 (Across chest): deferred Action 2 (Overhead): deferred Action 3 (LB reach): deferred   Provocative  Test(s):  Phalen's test: deferred Tinel's test: deferred Apley's scratch test (touch opposite shoulder):  Action 1 (Across chest): deferred Action 2 (Overhead):  deferred Action 3 (LB reach): deferred     Assessment   Status Diagnosis  Having a Flare-up Having a Flare-up Controlled 1. Cervical facet joint syndrome   2. Cervicalgia   3. Spinal cord stimulator status   4. History of fusion of lumbar spine (L2-L5)   5. Chronic pain syndrome      Updated Problems: Problem  Chronic Pain Syndrome    Plan of Care  Sean Reyes. has a current medication list which includes the following long-term medication(s): benazepril, eliquis, gabapentin, metformin, propafenone, rosuvastatin, and fluticasone.  Cervical spine x-ray shows advanced cervical degenerative disc disease.  Patient is participating in physical therapy.  Recommend advanced imaging with cervical MRI and further discussion of treatment plan after that. Orders:  Orders Placed This Encounter  Procedures  . MR CERVICAL SPINE WO CONTRAST    Patient presents with axial pain with possible radicular component.  In addition to any acute findings, please report on:  1. Facet (Zygapophyseal) joint DJD (Hypertrophy, space narrowing, subchondral sclerosis, and/or osteophyte formation) 2. DDD and/or IVDD (Loss of disc height, desiccation or "Black disc disease") 3. Pars defects 4. Spondylolisthesis, spondylosis, and/or spondyloarthropathies (include Degree/Grade of displacement in mm) 5. Vertebral body Fractures, including age (old, new/acute) 59. Modic Type Changes 7. Demineralization 8. Bone pathology 9. Central, Lateral Recess, and/or Foraminal Stenosis (include AP diameter of stenosis in mm) 10. Surgical changes (hardware type, status, and presence of fibrosis) NOTE: Please specify level(s) and laterality.    Standing Status:   Future    Standing Expiration Date:   10/06/2020    Order Specific Question:   What is the  patient's sedation requirement?    Answer:   No Sedation    Order Specific Question:   Does the patient have a pacemaker or implanted devices?    Answer:   No    Order Specific Question:   Preferred imaging location?    Answer:   [facility] (table limit-350lbs)    Order Specific Question:   Call Results- Best Contact Number?    Answer:   [phone] ([facility])    Order Specific Question:   Radiology Contrast Protocol - do NOT remove file path    Answer:   [email or web address]   Follow-up plan:   Return for will call pt with MRI results.     Status post caudal 02/03/2019: 6 cc injected- not helpful, status post Nevro spinal cord stimulator implant with Dr. Mearl Latin at [place] Visits Date Type Provider Dept  04/08/20 Office Visit Gillis Santa, MD [facility]  Showing recent visits within past 90 days and meeting all other requirements Today's Visits Date Type Provider Dept  07/06/20 Office Visit Gillis Santa, MD [facility]  Showing today's visits and meeting all other requirements Future Appointments No visits were found meeting these conditions. Showing future appointments within next 90 days and meeting all other requirements  I discussed the assessment and treatment plan with the patient. The patient was provided an opportunity to ask questions and all were answered. The patient agreed with the plan and demonstrated an understanding of the instructions.  Patient advised to call back or seek an in-person evaluation if the symptoms or condition worsens.  Duration of encounter: 35mnutes.  Note by: BGillis Santa MD Date: 07/06/2020; Time: 9:16 AM

## 2020-07-06 NOTE — Patient Instructions (Signed)
Leslie Dales and Winn neurological surgery (7th ed., pp. [phone]). [place], [place]: Elsevier.">  Spinal Stenosis  Spinal stenosis happens when the spinal canal gets smaller. The spinal canal is the space between the bones of your spine (vertebrae). As the canal gets smaller, the nerves that pass that part of the spine are pressed. This causes pain. Spinal stenosis can affect your neck, upper back, or lower back. What are the causes? This condition is caused by parts of bone that push into your spinal canal. This problem may start before birth. If it occurs after birth, the cause may be:  Breakdown of bones of your spine. This normally starts around 78 years of age.  Injury to your spine.  Tumors in your spine.  A buildup of calcium in your spine. What increases the risk? You are more likely to develop this condition if:  You are older than age 52.  You were born with a problem in your spine, such as a curved spine (scoliosis).  You have arthritis. This is disease of your joints. What are the signs or symptoms? Common symptoms of this condition include:  Pain in your neck or back. The pain may be worse when you stand or walk.  Problems with your legs. A leg may lose feeling, tingle, or turn hot or cold.  Pain that goes from your butt down to your lower leg (sciatica).  Falling a lot.  Slapping your foot down when you walk. This weakens muscles. Severe symptoms of this condition include:  Problems pooping or peeing.  Trouble having sex.  Loss of feeling in your leg.  Being unable to walk. Sometimes there are no symptoms. How is this treated? To treat pain and manage symptoms, you may be asked to:  Practice sitting and standing up straight. This is good posture.  Exercise.  Lose weight, if needed.  Take medicines or get shots.  Support your back with a corset or a brace. In some cases, you may need to have surgery. Follow these instructions at home: Managing pain,  stiffness, and swelling  Stand and sit up straight. If you have a brace or a corset, wear it as told.  Keep a healthy weight. Talk with your doctor if you need help losing weight.  If told, put heat on the affected area. Do this as often as told by your doctor. Use the heat source that your doctor recommends, such as a moist heat pack or a heating pad. ? Place a towel between your skin and the heat source. ? Leave the heat on for 20-30 minutes. ? Take off the heat if your skin turns bright red. This is very important if you are unable to feel pain, heat, or cold. You have a greater risk of getting burned.   Activity  Do exercises as told by your doctor.  Do not do activities that cause pain. Ask your doctor what activities are safe for you.  Do not lift anything that is heavier than 10 lb (4.5 kg), or the limit that you are told by your doctor.  Return to your normal activities as told by your doctor. Ask your doctor what activities are safe for you. General instructions  Take over-the-counter and prescription medicines only as told by your doctor.  Do not use any products that contain nicotine or tobacco, such as cigarettes, e-cigarettes, and chewing tobacco. If you need help quitting, ask your doctor.  Eat a healthy diet. Eat a lot of: ? Fruits. ? Vegetables. ?  Whole grains. ? Low-fat (lean) protein.  Keep all follow-up visits as told by your doctor. This is important. Contact a doctor if:  Your symptoms do not get better.  Your symptoms get worse.  You have a fever. Get help right away if:  You have new pain or very bad pain in your neck or upper back.  You have very bad pain, and medicine does not help.  You have a very bad headache.  You are dizzy.  You do not see well.  You vomit or feel like you may vomit.  You have these things in your back or legs: ? New or worse loss of feeling. ? New or worse tingling.  Your arm or leg: ? Hurts or swells. ? Turns  red. ? Feels warm. Summary  Spinal stenosis happens when the space between the bones of the spine gets smaller. The small space puts pressure on the nerves in your spine.  This condition may start before birth. Breakdown of bones, injuries, or tumors can cause this condition after birth.  Spinal stenosis can cause pain in the neck, back, legs, or butt. A leg may lose feeling, tingle, or turn hot or cold.  Treatment for this condition focuses on lessening your pain and other symptoms. This information is not intended to replace advice given to you by your health care provider. Make sure you discuss any questions you have with your health care provider. Document Revised: 11/28/2018 Document Reviewed: 11/28/2018 Elsevier Patient Education  2021 [employer].

## 2020-07-06 NOTE — Progress Notes (Signed)
Safety precautions to be maintained throughout the outpatient stay will include: orient to surroundings, keep bed in low position, maintain call bell within reach at all times, provide assistance with transfer out of bed and ambulation.  

## 2020-07-14 DIAGNOSIS — H25811 Combined forms of age-related cataract, right eye: Secondary | ICD-10-CM | POA: Diagnosis not present

## 2020-07-14 DIAGNOSIS — H52201 Unspecified astigmatism, right eye: Secondary | ICD-10-CM | POA: Diagnosis not present

## 2020-07-14 DIAGNOSIS — Z7984 Long term (current) use of oral hypoglycemic drugs: Secondary | ICD-10-CM | POA: Diagnosis not present

## 2020-07-14 DIAGNOSIS — N189 Chronic kidney disease, unspecified: Secondary | ICD-10-CM | POA: Diagnosis not present

## 2020-07-14 DIAGNOSIS — E1122 Type 2 diabetes mellitus with diabetic chronic kidney disease: Secondary | ICD-10-CM | POA: Diagnosis not present

## 2020-07-14 DIAGNOSIS — I129 Hypertensive chronic kidney disease with stage 1 through stage 4 chronic kidney disease, or unspecified chronic kidney disease: Secondary | ICD-10-CM | POA: Diagnosis not present

## 2020-07-20 ENCOUNTER — Ambulatory Visit: Payer: Medicare Other

## 2020-07-22 ENCOUNTER — Other Ambulatory Visit: Payer: Self-pay

## 2020-07-22 ENCOUNTER — Ambulatory Visit
Admission: RE | Admit: 2020-07-22 | Discharge: 2020-07-22 | Disposition: A | Discharge status: Home / Self Care | Payer: Medicare Other | Source: Ambulatory Visit | Attending: Student in an Organized Health Care Education/Training Program | Admitting: Student in an Organized Health Care Education/Training Program

## 2020-07-22 DIAGNOSIS — M542 Cervicalgia: Secondary | ICD-10-CM | POA: Insufficient documentation

## 2020-07-28 DIAGNOSIS — Z7984 Long term (current) use of oral hypoglycemic drugs: Secondary | ICD-10-CM | POA: Diagnosis not present

## 2020-07-28 DIAGNOSIS — H52202 Unspecified astigmatism, left eye: Secondary | ICD-10-CM | POA: Diagnosis not present

## 2020-07-28 DIAGNOSIS — I129 Hypertensive chronic kidney disease with stage 1 through stage 4 chronic kidney disease, or unspecified chronic kidney disease: Secondary | ICD-10-CM | POA: Diagnosis not present

## 2020-07-28 DIAGNOSIS — N189 Chronic kidney disease, unspecified: Secondary | ICD-10-CM | POA: Diagnosis not present

## 2020-07-28 DIAGNOSIS — H25812 Combined forms of age-related cataract, left eye: Secondary | ICD-10-CM | POA: Diagnosis not present

## 2020-07-28 DIAGNOSIS — E1122 Type 2 diabetes mellitus with diabetic chronic kidney disease: Secondary | ICD-10-CM | POA: Diagnosis not present

## 2020-08-02 ENCOUNTER — Other Ambulatory Visit: Payer: Self-pay | Admitting: Student in an Organized Health Care Education/Training Program

## 2020-08-02 ENCOUNTER — Telehealth: Payer: Self-pay | Admitting: Student in an Organized Health Care Education/Training Program

## 2020-08-02 DIAGNOSIS — M47812 Spondylosis without myelopathy or radiculopathy, cervical region: Secondary | ICD-10-CM

## 2020-08-02 NOTE — Progress Notes (Signed)
I spoke to Mr. Doren about his cervical MRI.  Results are below.  He continues to have persistent neck pain primarily.  He does not endorse any pain radiation into his shoulders or arms.  [name]. has a history of greater than 3 months of moderate to severe pain which is resulted in functional impairment.  The patient has tried various conservative therapeutic options such as NSAIDs, Tylenol, muscle relaxants, physical therapy which was inadequately effective.  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  Cervical facet medial branch nerve blocks were discussed with the patient.  Risks and benefits were reviewed.  Patient would like to proceed with bilateral C4, C5, C6, medial branch nerve block.   Orders Placed This Encounter  Procedures   CERVICAL FACET (MEDIAL BRANCH NERVE BLOCK)     Standing Status:   Future    Standing Expiration Date:   09/01/2020    Scheduling Instructions:     Side: Bilateral     Level: C4-5, C5-6 Facet joints (C4, C5, C6, Medial Branch Nerves)     Sedation: Patient's choice.     Timeframe: As soon as schedule allows    Order Specific Question:   Where will this procedure be performed?    Answer:   [facility]

## 2020-08-02 NOTE — Telephone Encounter (Signed)
Patient called to check on results of MRI. Last check out states DR. Lateef will call patient with results.

## 2020-08-03 ENCOUNTER — Telehealth: Payer: Medicare Other | Admitting: General Practice

## 2020-08-03 ENCOUNTER — Ambulatory Visit (INDEPENDENT_AMBULATORY_CARE_PROVIDER_SITE_OTHER): Payer: Medicare Other

## 2020-08-03 DIAGNOSIS — I152 Hypertension secondary to endocrine disorders: Secondary | ICD-10-CM | POA: Diagnosis not present

## 2020-08-03 DIAGNOSIS — E1159 Type 2 diabetes mellitus with other circulatory complications: Secondary | ICD-10-CM | POA: Diagnosis not present

## 2020-08-03 DIAGNOSIS — G894 Chronic pain syndrome: Secondary | ICD-10-CM

## 2020-08-03 NOTE — Patient Instructions (Addendum)
Visit Information  PATIENT GOALS: Patient Care Plan: RNCM: Hypertension (Adult)     Problem Identified: RNCM: Hypertension (Hypertension)   Priority: Medium     Goal: RNCM: Hypertension Monitored   Priority: Medium  Note:   Objective:  Last practice recorded BP readings:  BP Readings from Last 3 Encounters:  07/06/20 130/78  06/10/20 130/75  05/25/20 107/73     Most recent eGFR/CrCl: No results found for: EGFR  No components found for: CRCL Current Barriers:  Knowledge Deficits related to basic understanding of hypertension pathophysiology and self care management Knowledge Deficits related to understanding of medications prescribed for management of hypertension Unable to independently manage HTN Unable to perform IADLs independently- with back pain exacerbations sometimes he cannot do IADLs Does not contact provider office for questions/concerns Case Manager Clinical Goal(s):  Over the next 120 days, patient will verbalize understanding of plan for hypertension management Over the next 120 days, patient will attend all scheduled medical appointments: 09-09-2020 with PCP.  Patient and RNCM will work on getting an earlier PCP appointment to address slow heart rate and area on his toe that daughter wants assessed.  Patient plans to contact cardiologist office for an appointment. Over the next 120 days, patient will demonstrate improved adherence to prescribed treatment plan for hypertension as evidenced by taking all medications as prescribed, monitoring and recording blood pressure as directed, adhering to low sodium/DASH diet Over the next 120 days, patient will demonstrate improved health management independence as evidenced by checking blood pressure as directed and notifying PCP if SBP>160 or DBP > 90, taking all medications as prescribe, and adhering to a low sodium diet as discussed. Over the next 120 days, patient will verbalize basic understanding of hypertension disease  process and self health management plan as evidenced by compliance with heart healthy diet, compliance to medications, and working with the CCM team to maintain health and well being.  Interventions:  Collaboration with [name], NP regarding development and update of comprehensive plan of care as evidenced by provider attestation and co-signature Inter-disciplinary care team collaboration (see longitudinal plan of care) Evaluation of current treatment plan related to hypertension self management and patient's adherence to plan as established by provider. 05-25-2020: The patient is having some concerns with low blood pressure. States sometimes he is having readings of 100/50 with light headedness and dizziness. Sometimes he does not take his medications or cuts it in half. Denies light headed or dizziness today. Has follow up with pcp on 05-26-2020: Encouraged the patient to discuss with the pcp about changes. 08-03-2020: Patient states his blood pressure "goes up and down".  Denies any light headedness or dizziness.  Yesterday, patient had an episode where his heart rate dropped to 45/min.  He was symptomatic - absolutely no energy.  Patient has a watch that tracks his heart rate so this is how he learned of his heart rate dropping to 45/min.  He reports his heart rate is typically in the mid 50's - 60's/min. I recommended he make an appointment with his cardiologist soon to address his heart rate and possible medication adjustments and move up his scheduled PCP appointment to address heart rate, blood pressure, possible medication adjustments and an area on his toe that his daughter wants assessed - especially with patient being diabetic.  Patient agreed. Provided education to patient re: stroke prevention, s/s of heart attack and stroke, DASH diet, complications of uncontrolled blood pressure. 05-26-2020: Education on systolic range of [id number] 323 and diastolic 60  to 90. Discussed safety when feeling  light headed and dizziness.  Reviewed medications with patient and discussed importance of compliance. 05-25-2020: The patient sometimes has cut his medication in half or not taken due to low blood pressure. 08-03-2020:  Patient is compliant with medications.  Patient states he is taking Metoprolol 25 mg 1 tab currently.  Discussed plans with patient for ongoing care management follow up and provided patient with direct contact information for care management team Advised patient, providing education and rationale, to monitor blood pressure daily and record, calling PCP for findings outside established parameters. 05-25-2020: The patient encouraged to write down questions to ask the pcp on visit tomorrow, especially parameters for taking medications  Reviewed scheduled/upcoming provider appointments including: 09-09-2020 at 11 am is scheduled with PCP.  Will work on getting earlier PCP appointment and an appointment with cardiologist soon to address decreased heart rate.  Patient plans to contact cardiologist office for an appointment. Patient Goals/Self-Care Activities Over the next 120 days, patient will:  - Self administers medications as prescribed Attends all scheduled provider appointments Calls provider office for new concerns, questions, or BP outside discussed parameters Checks BP and records as discussed Follows a low sodium diet/DASH diet - blood pressure trends reviewed - depression screen reviewed - home or ambulatory blood pressure monitoring encouraged Follow Up Plan: Telephone follow up appointment with care management team member scheduled for: 11-02-2020 at 9:00 am    Task: RNCM: Identify and Monitor Blood Pressure Elevation   Note:   Care Management Activities:    - blood pressure trends reviewed - depression screen reviewed - home or ambulatory blood pressure monitoring encouraged       Patient Care Plan: RNCM: Chronic Pain (Adult)     Problem Identified: RNCM: Pain  Management Plan (Chronic Pain)   Priority: High     Goal: RNCM: Pain Management Plan Developed   Priority: High  Note:   Current Barriers:  Knowledge Deficits related to managing acute/chronic pain Non-adherence to scheduled provider appointments Non-adherence to prescribed medication regimen Difficulty obtaining medications Chronic Disease Management support and education needs related to chronic pain Unable to independently manage pain exacerbations, has a pain stiumlator Unable to perform IADLs independently Does not contact provider office for questions/concerns  Nurse Case Manager Clinical Goal(s):  Over the next 120 days, patient will verbalize understanding of plan for managing pain Over the next 120 days, patient will attend all scheduled medical appointments: 09-09-2020 Over the next  120  days patient will demonstrate use of different relaxation  skills and/or diversional activities to assist with pain reduction (distraction, imagery, relaxation, massage, acupressure, TENS, heat, and cold application Over the next  120   days patient will report pain at a level less than 3 to 4 on a 10-10 rating scale Over the next  120  days patient will use pharmacological and nonpharmacological pain relief strategies Over the next  120 days patient will verbalize acceptable level of pain relief and ability to engage in desired activities Over the next 120  days patient will engage in desired activities without an increase in pain level  Interventions:  Collaboration with [name], NP regarding development and update of comprehensive plan of care as evidenced by provider attestation and co-signature Inter-disciplinary care team collaboration (see longitudinal plan of care) - deep breathing, relaxation and mindfulness use promoted - effectiveness of pharmacologic therapy monitored. 05-25-2020: The patient is scared to take pain medications because of the experience his daughter has had  with the pain medications she has taken and is addicted to it. The patient says the gabapentin is not effective in helping with pain. He is unsure what to do and is waiting for xray results.  - motivation and barriers to change assessed and addressed. 05-25-2020: The patient wants to feel better because he wants to be active and enjoy the things he likes to do.  - pain assessed- 05-25-2020: The patient states his pain is worse. He has had xrays and is waiting for the results. The stimulator may have become dislodged. Will discuss with the specialist. Will continue to monitor.  - pain treatment goals reviewed. 05-25-2020: The patient wants his pain under control so he can do the things he likes to do - participation in physical therapy encouraged - premedication prior to activity encouraged Evaluation of current treatment plan related to chronic pain and patient's adherence to plan as established by provider. 05-25-2020: The patient will see pcp tomorrow. The patient is concerned as the pain stimulator is not working as well as he had hoped. He is hopeful the xrays will show what seems to be the issue. 08-03-2020:  Patient had cervical MRI done recently. 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  Patient states his pain is a "7 or 8" with walking.  He says walking is difficult at times.  Patient has pain stimulator and thinks a change in it's settings is needed possibly. Per chart review, a bilateral C4, C5, C6 medial branch nerve block has been ordered and patient has agreed to more forward with this procedure.  Advised patient to contact the pain specialist and discuss new onset of pain and see if the pain stimulator needs to be adjusted.  08-03-2020: Patient thinks his pain stimulator needs an adjustment in it's settings for better pain management.  Provided education to patient re: review of recent activity and safety.  Patient verbalized current symptoms started on Sunday. He  is having to use his cane at this time due to pain exacerbation.  Reviewed medications with patient and discussed compliance. 08-03-2020:  Patient is compliant with medications. Discussed plans with patient for ongoing care management follow up and provided patient with direct contact information for care management team Allow patient to maintain a diary of pain ratings, timing, precipitating events, medications, treatments, and what works best to relieve pain,  Refer to support groups and self-help groups Educate patient about the use of pharmacological interventions for pain management- antianxiety, antidepressants, NSAIDS, opioid analgesics,  Explain the importance of lifestyle modifications to effective pain management   Patient Goals/Self Care Activities:  Patient verbalizes understanding of plan to effectively manage pain and discomfort  Self-administers medications as prescribed Attends all scheduled provider appointments Calls pharmacy for medication refills Calls provider office for new concerns or questions - mutually acceptable comfort goal set - pain assessed - pain treatment goals reviewed - patient response to treatment assessed - sharing of pain management plan with teachers and other caregivers encouraged  Follow Up Plan: Telephone follow up appointment with care management team member scheduled for: 11-02-2020 at 9:00 am      Task: RNCM: Partner to Develop Chronic Pain Management Plan   Note:   Care Management Activities:    - mutually acceptable comfort goal set - pain assessed - pain treatment goals reviewed - patient response to treatment assessed - sharing of pain management plan with teachers and other caregivers encouraged    Notes: patient has a pain stimulator  Patient Care Plan: RNCM: Urinary and Bladder Issues     Problem Identified: RNCM: Urinary and Bladder issuse   Priority: High     Goal: RNCM: Urinary and Bladder Concerns Completed  05/25/2020  Note:   Current Barriers: Goal met- patient is not having to self cath any longer. Will continue to monitor.  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  Unable to independently manage bladder health with issues of urgency and at times unable to void  Does not contact provider office for questions/concerns  Nurse Case Manager Clinical Goal(s):  Over the next 120 days, patient will verbalize understanding of plan for effective management of bladder health Over the next 120 days, patient will work with [facility] and pcp to address needs related to changes in bladder patterns and assistance with care  Over the next 120 days, patient will demonstrate a decrease in urgency  exacerbations as evidenced by normalized bladder emptying schedule and normal bladder patterns Over the next 120 days, patient will attend all scheduled medical appointments: 05-26-2020 Over the next 120 days, patient will demonstrate improved adherence to prescribed treatment plan for bladder urgency/inability to void  as evidenced bycalling the office for changes, medications compliance, no evidence of UTI's or other infections   Interventions:  1:1 collaboration with [name], NP regarding development and update of comprehensive plan of care as evidenced by provider attestation and co-signature Inter-disciplinary care team collaboration (see longitudinal plan of care) Evaluation of current treatment plan related to bladder health and wellness  and patient's adherence to plan as established by provider. Advised patient to call the office for changes in bladder health or questions  Provided education to patient re: safety, preventing infection, talking to provider for worsening condtions  Reviewed medications with  patient and discussed compliance Reviewed scheduled/upcoming provider appointments including: 05-26-2020 Discussed plans with patient for ongoing care management follow up and provided patient with direct contact information for care management team  Patient Goals/Self-Care Activities Over the next 120 days, patient will:  - Patient will self administer medications as prescribed Patient will attend all scheduled provider appointments Patient will call pharmacy for medication refills Patient will attend church or other social activities Patient will continue to perform IADL's independently Patient will call provider office for new concerns or questions Patient will work with BSW to address care coordination needs and will continue to work with the clinical team to address health care and disease management related needs.   - education plan reviewed and/or amended - health literacy screen reviewed - patient's preferred learning methods utilized - privacy ensured - questions encouraged - readiness to learn monitored  Follow Up Plan: Telephone follow up appointment with care management team member scheduled for: 06-01-2020 at 0900 am        Task: RNCM: Bladder health Education Completed 05/25/2020  Outcome: Positive  Note:   Care Management Activities:    - education plan reviewed and/or amended - health literacy screen reviewed - patient's preferred learning methods utilized - privacy ensured - questions encouraged - readiness to learn monitored           Patient verbalizes understanding of instructions provided today and agrees to view in [place].   Telephone follow up appointment with care management team member scheduled for: 11-02-2020 at [place] RN, [place]  [facility] Mobile: [phone]

## 2020-08-03 NOTE — Chronic Care Management (AMB) (Signed)
Chronic Care Management   CCM RN Visit Note  08/03/2020 Name: Sean Reyes. MRN: [id number] DOB: [birth year]  Subjective: Sean Roam. is a 80 y.o. year old male who is a primary care patient of [name], [name], NP. The care management team was consulted for assistance with disease management and care coordination needs.    Engaged with patient by telephone for follow up visit in response to provider referral for case management and/or care coordination services.   Consent to Services:  The patient was given information about Chronic Care Management services, agreed to services, and gave verbal consent prior to initiation of services.  Please see initial visit note for detailed documentation.   Patient agreed to services and verbal consent obtained.   Assessment: Review of patient past medical history, allergies, medications, health status, including review of consultants reports, laboratory and other test data, was performed as part of comprehensive evaluation and provision of chronic care management services.   SDOH (Social Determinants of Health) assessments and interventions performed:    CCM Care Plan  Allergies  Allergen Reactions   Levaquin [Levofloxacin In D5w] Anaphylaxis and Shortness Of Breath   Shellfish Allergy Anaphylaxis    Has used duraprep, betadine and ioban in previous surgeries in 2019 and 2018 without issue   Amiodarone Other (See Comments)    Tremors and thyroid toxicity   Adhesive [Tape] Other (See Comments)    Little red bumps under the dressing.  He questions whether is latex related    Outpatient Encounter Medications as of 08/03/2020  Medication Sig   acetaminophen (TYLENOL) 500 MG tablet Take 500 mg by mouth every 6 (six) hours as needed (pain).   AMLODIPINE BESYLATE PO Take 5 mg by mouth.   benazepril (LOTENSIN) 40 MG tablet Take 1 tablet (40 mg total) by mouth daily.   ELIQUIS 5 MG TABS tablet Take 1 tablet (5 mg total) by mouth 2 (two) times  daily.   finasteride (PROSCAR) 5 MG tablet Take 1 tablet (5 mg total) by mouth daily.   fluticasone (FLONASE) 50 MCG/ACT nasal spray Place 2 sprays into both nostrils 2 (two) times daily. (Patient not taking: Reported on 07/06/2020)   gabapentin (NEURONTIN) 300 MG capsule Take 1 capsule (300 mg total) by mouth 2 (two) times daily.   metFORMIN (GLUCOPHAGE-XR) 500 MG 24 hr tablet Take 2 tablets (1,000 mg total) by mouth 2 (two) times daily.   metoprolol succinate (TOPROL-XL) 25 MG 24 hr tablet Take 1 tablet by mouth daily.   pantoprazole (PROTONIX) 40 MG tablet Take 40 mg by mouth at bedtime.    propafenone (RYTHMOL SR) 425 MG 12 hr capsule Take 425 mg by mouth 2 (two) times daily.   rosuvastatin (CRESTOR) 5 MG tablet Take 1 tablet (5 mg total) by mouth daily.   solifenacin (VESICARE) 5 MG tablet Take 5 mg by mouth daily.   tamsulosin (FLOMAX) 0.4 MG CAPS capsule Take 0.8 mg by mouth daily.   No facility-administered encounter medications on file as of 08/03/2020.    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   Postlaminectomy syndrome, lumbar region 01/16/2019   History  of fusion of lumbar spine (L2-L5) 01/16/2019   Chronic radicular lumbar pain 01/16/2019   HNP (herniated nucleus pulposus), lumbar 04/29/2018   Advanced care planning/counseling discussion 09/28/2016   Hyperlipidemia associated with type 2 diabetes mellitus ([place]) 07/14/2015   Anemia 06/30/2015   BPH with obstruction/lower urinary tract symptoms 06/02/2015   OSA (obstructive sleep apnea) 03/23/2015   Type 2 diabetes mellitus with diabetic chronic  kidney disease ([place]) 01/05/2015   Hypertension associated with diabetes ([place]) 09/28/2014   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[place]) 09/28/2014   H/O prior ablation treatment 10/19/2011   Paroxysmal atrial fibrillation ([place]) 10/19/2011    Conditions to be addressed/monitored:HTN and Chronic Pain  Care Plan : RNCM: Hypertension (Adult)  Updates made by Inge Rise, RN since 08/03/2020 12:00 AM     Problem: RNCM: Hypertension (Hypertension)   Priority: Medium     Goal: RNCM: Hypertension Monitored   Priority: Medium  Note:   Objective:  Last practice recorded BP readings:  BP Readings from Last 3 Encounters:  07/06/20 130/78  06/10/20 130/75  05/25/20 107/73     Most recent eGFR/CrCl: No results found for: EGFR  No components found for: CRCL Current Barriers:  Knowledge Deficits related to basic understanding of hypertension pathophysiology and self care management Knowledge Deficits related to understanding of medications prescribed for management of hypertension Unable to independently manage HTN Unable to perform IADLs independently- with back pain exacerbations sometimes he cannot do IADLs Does not contact provider office for questions/concerns Case Manager Clinical Goal(s):  Over the next 120 days, patient will verbalize understanding of plan for hypertension management Over the next 120 days, patient will attend all scheduled medical appointments: 09-09-2020 with PCP.  Patient and RNCM will work on getting an earlier PCP appointment to address slow heart rate and area on his toe that daughter wants assessed.  Patient plans to contact cardiologist office for an appointment. Over the next 120 days, patient will demonstrate improved adherence to prescribed treatment plan for hypertension as evidenced by taking all medications as prescribed, monitoring and recording blood pressure as directed, adhering to low sodium/DASH diet Over the next 120 days, patient will  demonstrate improved health management independence as evidenced by checking blood pressure as directed and notifying PCP if SBP>160 or DBP > 90, taking all medications as prescribe, and adhering to a low sodium diet as discussed. Over the next 120 days, patient will verbalize basic understanding of hypertension disease process and self health management plan as evidenced by compliance with heart healthy diet, compliance to medications, and working with the CCM team to maintain health and well being.  Interventions:  Collaboration with [name], NP regarding development and update of comprehensive plan of care as evidenced by provider attestation and co-signature Inter-disciplinary care team collaboration (see longitudinal plan of care) Evaluation of current treatment plan related to hypertension self management and patient's adherence to plan as established by provider. 05-25-2020: The patient is having some concerns with low blood pressure. States sometimes he is having readings of 100/50 with light headedness and dizziness. Sometimes he does not take his medications or cuts it in half. Denies light headed or dizziness today. Has follow up with pcp on 05-26-2020: Encouraged the patient to discuss with the pcp about changes. 08-03-2020: Patient states his blood pressure "goes up and down".  Denies any light headedness or dizziness.  Yesterday, patient had an episode where his heart rate dropped to 45/min.  He was symptomatic - absolutely no energy.  Patient has a watch that tracks his heart rate so this is how he learned of his heart rate dropping to 45/min.  He reports his heart rate is typically in the mid 50's - 60's/min. I recommended he make an appointment with his cardiologist soon to address his heart rate and possible medication adjustments and move up his scheduled PCP appointment to address heart rate, blood pressure, possible medication adjustments and an area on his toe that his daughter  wants assessed - especially with patient being diabetic.  Patient agreed. Provided education to patient re: stroke prevention, s/s of heart attack and stroke, DASH diet, complications of uncontrolled blood pressure. 05-26-2020: Education on systolic range of [id number] and diastolic 60 to 90. Discussed safety when feeling light headed and dizziness.  Reviewed medications with patient and discussed importance of compliance. 05-25-2020: The patient sometimes has cut his medication in half or not taken due to low blood pressure. 08-03-2020:  Patient is compliant with medications.  Patient states he is taking Metoprolol 25 mg 1 tab currently.  Discussed plans with patient for ongoing care management follow up and provided patient with direct contact information for care management team Advised patient, providing education and rationale, to monitor blood pressure daily and record, calling PCP for findings outside established parameters. 05-25-2020: The patient encouraged to write down questions to ask the pcp on visit tomorrow, especially parameters for taking medications  Reviewed scheduled/upcoming provider appointments including: 09-09-2020 at 11 am is scheduled with PCP.  Will work on getting earlier PCP appointment and an appointment with cardiologist soon to address decreased heart rate.  Patient plans to contact cardiologist office for an appointment. Patient Goals/Self-Care Activities Over the next 120 days, patient will:  - Self administers medications as prescribed Attends all scheduled provider appointments Calls provider office for new concerns, questions, or BP outside discussed parameters Checks BP and records as discussed Follows a low sodium diet/DASH diet - blood pressure trends reviewed - depression screen reviewed - home or ambulatory blood pressure monitoring encouraged Follow Up Plan: Telephone follow up appointment with care management team member scheduled for: 11-02-2020 at 9:00 am     Care Plan : RNCM: Chronic Pain (Adult)  Updates made by Inge Rise, RN since 08/03/2020 12:00 AM     Problem: RNCM: Pain Management Plan (Chronic Pain)   Priority: High     Goal: RNCM: Pain Management Plan Developed   Priority: High  Note:   Current Barriers:  Knowledge Deficits related to managing acute/chronic pain Non-adherence to scheduled provider appointments Non-adherence to prescribed medication regimen Difficulty obtaining medications Chronic Disease Management support and education needs related to chronic pain Unable to independently manage pain exacerbations, has a pain stiumlator Unable to perform IADLs independently Does not contact provider office for questions/concerns  Nurse Case Manager Clinical Goal(s):  Over the next 120 days, patient will verbalize understanding of plan for managing pain Over the next 120 days, patient will attend all scheduled medical appointments: 09-09-2020 Over the next  120  days patient will demonstrate use of different relaxation  skills and/or diversional activities to assist with pain reduction (distraction, imagery, relaxation, massage, acupressure, TENS, heat, and cold application Over the next  120   days patient will report pain at a level less than 3 to 4 on a 10-10 rating scale Over the next  120  days patient will use pharmacological and nonpharmacological pain relief strategies Over the next  120 days patient will verbalize acceptable level of pain  relief and ability to engage in desired activities Over the next 120  days patient will engage in desired activities without an increase in pain level  Interventions:  Collaboration with [name], NP regarding development and update of comprehensive plan of care as evidenced by provider attestation and co-signature Inter-disciplinary care team collaboration (see longitudinal plan of care) - deep breathing, relaxation and mindfulness use promoted - effectiveness of  pharmacologic therapy monitored. 05-25-2020: The patient is scared to take pain medications because of the experience his daughter has had with the pain medications she has taken and is addicted to it. The patient says the gabapentin is not effective in helping with pain. He is unsure what to do and is waiting for xray results.  - motivation and barriers to change assessed and addressed. 05-25-2020: The patient wants to feel better because he wants to be active and enjoy the things he likes to do.  - pain assessed- 05-25-2020: The patient states his pain is worse. He has had xrays and is waiting for the results. The stimulator may have become dislodged. Will discuss with the specialist. Will continue to monitor.  - pain treatment goals reviewed. 05-25-2020: The patient wants his pain under control so he can do the things he likes to do - participation in physical therapy encouraged - premedication prior to activity encouraged Evaluation of current treatment plan related to chronic pain and patient's adherence to plan as established by provider. 05-25-2020: The patient will see pcp tomorrow. The patient is concerned as the pain stimulator is not working as well as he had hoped. He is hopeful the xrays will show what seems to be the issue. 08-03-2020:  Patient had cervical MRI done recently. 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  Patient states his pain is a "7 or 8" with walking.  He says walking is difficult at times.  Patient has pain stimulator and thinks a change in it's settings is needed possibly. Per chart review, a bilateral C4, C5, C6 medial branch nerve block has been ordered and patient has agreed to more forward with this procedure.  Advised patient to contact the pain specialist and discuss new onset of pain and see if the pain stimulator needs to be adjusted.  08-03-2020: Patient thinks his pain stimulator needs an adjustment in it's settings for better pain  management.  Provided education to patient re: review of recent activity and safety.  Patient verbalized current symptoms started on Sunday. He is having to use his cane at this time due to pain exacerbation.  Reviewed medications with patient and discussed compliance. 08-03-2020:  Patient is compliant with medications. Discussed plans with patient for ongoing care management follow up and provided patient with direct contact information for care management team Allow patient to maintain a diary of pain ratings, timing, precipitating events, medications, treatments, and what works best to relieve pain,  Refer to support groups and self-help groups Educate patient about the use of pharmacological interventions for pain management- antianxiety, antidepressants, NSAIDS, opioid analgesics,  Explain the importance of lifestyle modifications to effective pain management   Patient Goals/Self Care Activities:  Patient verbalizes understanding of plan to effectively manage pain and discomfort  Self-administers medications as prescribed Attends all scheduled provider appointments Calls pharmacy for medication refills Calls provider office for new concerns or questions - mutually acceptable comfort goal set - pain assessed - pain treatment goals reviewed - patient response to treatment assessed - sharing of pain management plan  with teachers and other caregivers encouraged  Follow Up Plan: Telephone follow up appointment with care management team member scheduled for: 11-02-2020 at 9:00 am       Plan:Telephone follow up appointment with care management team member scheduled for:  11-02-2020 at [place] RN, [facility] Mobile: [phone]

## 2020-08-06 ENCOUNTER — Encounter: Payer: Self-pay | Admitting: Nurse Practitioner

## 2020-08-06 ENCOUNTER — Other Ambulatory Visit: Payer: Self-pay

## 2020-08-06 ENCOUNTER — Ambulatory Visit (INDEPENDENT_AMBULATORY_CARE_PROVIDER_SITE_OTHER): Payer: Medicare Other | Admitting: Nurse Practitioner

## 2020-08-06 DIAGNOSIS — E1143 Type 2 diabetes mellitus with diabetic autonomic (poly)neuropathy: Secondary | ICD-10-CM | POA: Diagnosis not present

## 2020-08-06 DIAGNOSIS — I739 Peripheral vascular disease, unspecified: Secondary | ICD-10-CM

## 2020-08-06 NOTE — Assessment & Plan Note (Signed)
Chronic, ongoing with A1c 6.7% last visit.  Decreased sensation bilateral feet, monitor closely for wounds and falls.  Continue current medication regimen and adjust as needed.  Return as scheduled in August.

## 2020-08-06 NOTE — Assessment & Plan Note (Signed)
To bilateral lower extremity.  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  Continue ASA daily and monitor closely for any wounds.  Return as scheduled in August.  Refer to vascular as needed.

## 2020-08-06 NOTE — Patient Instructions (Signed)
Leory Plowman and Daroff's neurology in clinical practice (8th ed., pp. 9030- 1929). Elsevier."> Goldman-Cecil medicine (26th ed., pp. 2489- 2501). Elsevier.">  Peripheral Neuropathy Peripheral neuropathy is a type of nerve damage. It affects nerves that carry signals between the spinal cord and the arms, legs, and the rest of the body (peripheral nerves). It does not affect nerves in the spinal cord or brain. In peripheral neuropathy, one nerve or a group of nerves may be damaged. Peripheral neuropathy is a broad category that includes many specific nerve disorders,like diabetic neuropathy, hereditary neuropathy, and carpal tunnel syndrome. What are the causes? This condition may be caused by: Diabetes. This is the most common cause of peripheral neuropathy. Nerve injury. Pressure or stress on a nerve that lasts a long time. Lack (deficiency) of B vitamins. This can result from alcoholism, poor diet, or a restricted diet. Infections. Autoimmune diseases, such as rheumatoid arthritis and systemic lupus erythematosus. Nerve diseases that are passed from parent to child (inherited). Some medicines, such as cancer medicines (chemotherapy). Poisonous (toxic) substances, such as lead and mercury. Too little blood flowing to the legs. Kidney disease. Thyroid disease. In some cases, the cause of this condition is not known. What are the signs or symptoms? Symptoms of this condition depend on which of your nerves is damaged. Common symptoms include: Loss of feeling (numbness) in the feet, hands, or both. Tingling in the feet, hands, or both. Burning pain. Very sensitive skin. Weakness. Not being able to move a part of the body (paralysis). Muscle twitching. Clumsiness or poor coordination. Loss of balance. Not being able to control your bladder. Feeling dizzy. Sexual problems. How is this diagnosed? Diagnosing and finding the cause of peripheral neuropathy can be difficult. Your health care  provider will take your medical history and do a physical exam. A neurological exam will also be done. This involves checking things that are affected by your brain, spinal cord, and nerves (nervous system). For example, your health care provider will check your reflexes, how youmove, and what you can feel. You may have other tests, such as: Blood tests. Electromyogram (EMG) and nerve conduction tests. These tests check nerve function and how well the nerves are controlling the muscles. Imaging tests, such as CT scans or MRI to rule out other causes of your symptoms. Removing a small piece of nerve to be examined in a lab (nerve biopsy). Removing and examining a small amount of the fluid that surrounds the brain and spinal cord (lumbar puncture). How is this treated? Treatment for this condition may involve: Treating the underlying cause of the neuropathy, such as diabetes, kidney disease, or vitamin deficiencies. Stopping medicines that can cause neuropathy, such as chemotherapy. Medicine to help relieve pain. Medicines may include: Prescription or over-the-counter pain medicine. Antiseizure medicine. Antidepressants. Pain-relieving patches that are applied to painful areas of skin. Surgery to relieve pressure on a nerve or to destroy a nerve that is causing pain. Physical therapy to help improve movement and balance. Devices to help you move around (assistive devices). Follow these instructions at home: Medicines Take over-the-counter and prescription medicines only as told by your health care provider. Do not take any other medicines without first asking your health care provider. Do not drive or use heavy machinery while taking prescription pain medicine. Lifestyle  Do not use any products that contain nicotine or tobacco, such as cigarettes and e-cigarettes. Smoking keeps blood from reaching damaged nerves. If you need help quitting, ask your health care provider. Avoid or limit  alcohol. Too much alcohol can cause a vitamin B deficiency, and vitamin B is needed for healthy nerves. Eat a healthy diet. This includes: Eating foods that are high in fiber, such as fresh fruits and vegetables, whole grains, and beans. Limiting foods that are high in fat and processed sugars, such as fried or sweet foods.  General instructions  If you have diabetes, work closely with your health care provider to keep your blood sugar under control. If you have numbness in your feet: Check every day for signs of injury or infection. Watch for redness, warmth, and swelling. Wear padded socks and comfortable shoes. These help protect your feet. Develop a good support system. Living with peripheral neuropathy can be stressful. Consider talking with a mental health specialist or joining a support group. Use assistive devices and attend physical therapy as told by your health care provider. This may include using a walker or a cane. Keep all follow-up visits as told by your health care provider. This is important.  Contact a health care provider if: You have new signs or symptoms of peripheral neuropathy. You are struggling emotionally from dealing with peripheral neuropathy. Your pain is not well-controlled. Get help right away if: You have an injury or infection that is not healing normally. You develop new weakness in an arm or leg. You have fallen or do so frequently. Summary Peripheral neuropathy is when the nerves in the arms, or legs are damaged, resulting in numbness, weakness, or pain. There are many causes of peripheral neuropathy, including diabetes, pinched nerves, vitamin deficiencies, autoimmune disease, and hereditary conditions. Diagnosing and finding the cause of peripheral neuropathy can be difficult. Your health care provider will take your medical history, do a physical exam, and do tests, including blood tests and nerve function tests. Treatment involves treating the  underlying cause of the neuropathy and taking medicines to help control pain. Physical therapy and assistive devices may also help. This information is not intended to replace advice given to you by your health care provider. Make sure you discuss any questions you have with your healthcare provider. Document Revised: 11/11/2019 Document Reviewed: 11/11/2019 Elsevier Patient Education  2022 [employer].

## 2020-08-06 NOTE — Progress Notes (Signed)
BP 132/80 (BP Location: Left Arm)   Pulse 62   Temp 97.9 F (36.6 C) (Oral)   Wt 186 lb (84.4 kg)   SpO2 98%   BMI 24.54 kg/m    Subjective:    Patient ID: Sean Reyes., male    DOB: [birth year], 80 y.o.   MRN: [id number]  HPI: Sean Reyes. is a 80 y.o. male  Chief Complaint  Patient presents with   Peripheral Neuropathy    Patient states his daughter works at [employer] and was concerned about her father (patient) feet due to having some discoloration. Patient states he has spinal stenosis and says that his feet are extremely weak by the end of the day and states that at night they feel numb and "have no feeling or feel funky."   NEUROPATHY His daughter works for [employer] and [employer] and she is concerned about her father's feet + the veins in his toes.  Recent A1c -- 6.7% in April.  History of low B12 at 220 and this improved on labs in June 2021 with level 692.  Is seeing pain management, Dr. Holley Raring, last saw 07/06/20.   Neuropathy status: stable  Satisfied with current treatment?: yes Medication side effects: no Medication compliance:  good compliance Location: feet Pain: no Severity: mild  Quality:  pins and needles Frequency: intermittent Bilateral: yes Symmetric: yes Numbness: yes Decreased sensation: yes Weakness: no Context: stable Alleviating factors: Gabapentin and B12 Aggravating factors: nothing Treatments attempted: Gabapentin and B12  Relevant past medical, surgical, family and social history reviewed and updated as indicated. Interim medical history since our last visit reviewed. Allergies and medications reviewed and updated.  Review of Systems  Constitutional:  Negative for activity change, appetite change, diaphoresis, fatigue and fever.  Respiratory:  Negative for cough, chest tightness, shortness of breath and wheezing.   Cardiovascular:  Negative for chest pain, palpitations and leg swelling.  Gastrointestinal: Negative.    Neurological:  Positive for numbness. Negative for dizziness, syncope, weakness and headaches.  Psychiatric/Behavioral: Negative.     Per HPI unless specifically indicated above     Objective:    BP 132/80 (BP Location: Left Arm)   Pulse 62   Temp 97.9 F (36.6 C) (Oral)   Wt 186 lb (84.4 kg)   SpO2 98%   BMI 24.54 kg/m   Wt Readings from Last 3 Encounters:  08/06/20 186 lb (84.4 kg)  07/06/20 194 lb (88 kg)  06/10/20 187 lb 9.6 oz (85.1 kg)    Physical Exam Vitals and nursing note reviewed.  Constitutional:      General: He is awake. He is not in acute distress.    Appearance: He is well-developed and well-groomed. He is not ill-appearing.  HENT:     Head: Normocephalic and atraumatic.     Right Ear: Hearing normal. No drainage.     Left Ear: Hearing normal. No drainage.  Eyes:     General: Lids are normal.        Right eye: No discharge.        Left eye: No discharge.     Conjunctiva/sclera: Conjunctivae normal.     Pupils: Pupils are equal, round, and reactive to light.  Neck:     Thyroid: No thyromegaly.     Vascular: No carotid bruit.     Trachea: Trachea normal.  Cardiovascular:     Rate and Rhythm: Normal rate and regular rhythm.     Pulses:  Dorsalis pedis pulses are 1+ on the right side and 1+ on the left side.       Posterior tibial pulses are 1+ on the right side and 1+ on the left side.     Heart sounds: Normal heart sounds, S1 normal and S2 normal. No murmur heard.   No gallop.     Comments: Varicose veins bilateral lower legs, no edema. Pulmonary:     Effort: Pulmonary effort is normal. No accessory muscle usage or respiratory distress.     Breath sounds: Normal breath sounds.  Abdominal:     General: Bowel sounds are normal.     Palpations: Abdomen is soft.     Tenderness: There is no right CVA tenderness or left CVA tenderness.  Musculoskeletal:        General: Normal range of motion.     Cervical back: Normal range of motion and neck  supple.     Right lower leg: No edema.     Left lower leg: No edema.     Right foot: Normal range of motion.     Left foot: Normal range of motion.  Feet:     Right foot:     Protective Sensation: 8 sites tested.  10 sites sensed.     Skin integrity: Dry skin present. No ulcer.     Toenail Condition: Right toenails are normal.     Left foot:     Protective Sensation: 6 sites tested.  10 sites sensed.     Skin integrity: Dry skin present. No ulcer.     Toenail Condition: Left toenails are normal.  Lymphadenopathy:     Cervical: No cervical adenopathy.  Skin:    General: Skin is warm and dry.     Capillary Refill: Capillary refill takes less than 2 seconds.  Neurological:     Mental Status: He is alert and oriented to person, place, and time.     Deep Tendon Reflexes:     Reflex Scores:      Patellar reflexes are 1+ on the right side and 1+ on the left side.      Achilles reflexes are 1+ on the right side and 1+ on the left side.    Comments: Antalgic gait, no use of cane -- improving.  Psychiatric:        Attention and Perception: Attention normal.        Mood and Affect: Mood normal.        Speech: Speech normal.        Behavior: Behavior normal. Behavior is cooperative.        Thought Content: Thought content normal.    Results for orders placed or performed in visit on 06/10/20  CBC with Differential/Platelet  Result Value Ref Range   WBC 5.9 3.4 - 10.8 x10E3/uL   RBC 3.91 (L) 4.14 - 5.80 x10E6/uL   Hemoglobin 11.8 (L) 13.0 - 17.7 g/dL   Hematocrit 36.1 (L) 37.5 - 51.0 %   MCV 92 79 - 97 fL   MCH 30.2 26.6 - 33.0 pg   MCHC 32.7 31.5 - 35.7 g/dL   RDW 13.8 11.6 - 15.4 %   Platelets 175 150 - 450 x10E3/uL   Neutrophils 60 Not Estab. %   Lymphs 27 Not Estab. %   Monocytes 10 Not Estab. %   Eos 3 Not Estab. %   Basos 0 Not Estab. %   Neutrophils Absolute 3.5 1.4 - 7.0 x10E3/uL   Lymphocytes Absolute 1.6 0.7 -  3.1 x10E3/uL   Monocytes Absolute 0.6 0.1 - 0.9 x10E3/uL    EOS (ABSOLUTE) 0.2 0.0 - 0.4 x10E3/uL   Basophils Absolute 0.0 0.0 - 0.2 x10E3/uL   Immature Granulocytes 0 Not Estab. %   Immature Grans (Abs) 0.0 0.0 - 0.1 x10E3/uL  Comprehensive metabolic panel  Result Value Ref Range   Glucose 198 (H) 65 - 99 mg/dL   BUN 20 8 - 27 mg/dL   Creatinine, Ser 1.29 (H) 0.76 - 1.27 mg/dL   eGFR 57 (L) >59 mL/min/1.73   BUN/Creatinine Ratio 16 10 - 24   Sodium 137 134 - 144 mmol/L   Potassium 4.5 3.5 - 5.2 mmol/L   Chloride 98 96 - 106 mmol/L   CO2 24 20 - 29 mmol/L   Calcium 9.6 8.6 - 10.2 mg/dL   Total Protein 6.7 6.0 - 8.5 g/dL   Albumin 4.1 3.7 - 4.7 g/dL   Globulin, Total 2.6 1.5 - 4.5 g/dL   Albumin/Globulin Ratio 1.6 1.2 - 2.2   Bilirubin Total 0.4 0.0 - 1.2 mg/dL   Alkaline Phosphatase 69 44 - 121 IU/L   AST 16 0 - 40 IU/L   ALT 19 0 - 44 IU/L  Bayer DCA Hb A1c Waived  Result Value Ref Range   HB A1C (BAYER DCA - WAIVED) 6.7 <7.0 %  Iron, TIBC and Ferritin Panel  Result Value Ref Range   Total Iron Binding Capacity 342 250 - 450 ug/dL   UIBC 277 111 - 343 ug/dL   Iron 65 38 - 169 ug/dL   Iron Saturation 19 15 - 55 %   Ferritin 51 30 - 400 ng/mL  Folate  Result Value Ref Range   Folate 9.6 >3.0 ng/mL      Assessment & Plan:   Problem List Items Addressed This Visit       Cardiovascular and Mediastinum   Peripheral vascular disease ([place])    To bilateral lower extremity.  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  Continue ASA daily and monitor closely for any wounds.  Return as scheduled in August.  Refer to vascular as needed.         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  Decreased sensation bilateral feet, monitor closely for wounds and falls.  Continue current medication regimen and adjust as needed.  Return as scheduled in August.         Follow up plan: Return for in August as scheduled.

## 2020-08-20 ENCOUNTER — Other Ambulatory Visit: Payer: Self-pay | Admitting: Nurse Practitioner

## 2020-08-20 NOTE — Telephone Encounter (Signed)
Future visit 2 weeks 

## 2020-09-08 ENCOUNTER — Ambulatory Visit: Payer: Medicare Other

## 2020-09-09 ENCOUNTER — Ambulatory Visit (INDEPENDENT_AMBULATORY_CARE_PROVIDER_SITE_OTHER): Payer: Medicare Other | Admitting: Nurse Practitioner

## 2020-09-09 ENCOUNTER — Encounter: Payer: Self-pay | Admitting: Nurse Practitioner

## 2020-09-09 ENCOUNTER — Other Ambulatory Visit: Payer: Self-pay

## 2020-09-09 VITALS — BP 116/70 | HR 66 | Temp 98.5°F | Wt 188.2 lb

## 2020-09-09 DIAGNOSIS — G894 Chronic pain syndrome: Secondary | ICD-10-CM

## 2020-09-09 DIAGNOSIS — I152 Hypertension secondary to endocrine disorders: Secondary | ICD-10-CM | POA: Diagnosis not present

## 2020-09-09 DIAGNOSIS — D6869 Other thrombophilia: Secondary | ICD-10-CM | POA: Diagnosis not present

## 2020-09-09 DIAGNOSIS — E1159 Type 2 diabetes mellitus with other circulatory complications: Secondary | ICD-10-CM

## 2020-09-09 DIAGNOSIS — E785 Hyperlipidemia, unspecified: Secondary | ICD-10-CM

## 2020-09-09 DIAGNOSIS — I480 Paroxysmal atrial fibrillation: Secondary | ICD-10-CM | POA: Diagnosis not present

## 2020-09-09 DIAGNOSIS — E1143 Type 2 diabetes mellitus with diabetic autonomic (poly)neuropathy: Secondary | ICD-10-CM | POA: Diagnosis not present

## 2020-09-09 DIAGNOSIS — E1122 Type 2 diabetes mellitus with diabetic chronic kidney disease: Secondary | ICD-10-CM

## 2020-09-09 DIAGNOSIS — D649 Anemia, unspecified: Secondary | ICD-10-CM | POA: Diagnosis not present

## 2020-09-09 DIAGNOSIS — N1831 Chronic kidney disease, stage 3a: Secondary | ICD-10-CM | POA: Diagnosis not present

## 2020-09-09 DIAGNOSIS — E1169 Type 2 diabetes mellitus with other specified complication: Secondary | ICD-10-CM

## 2020-09-09 LAB — BAYER DCA HB A1C WAIVED: HB A1C (BAYER DCA - WAIVED): 6.6 % (ref <7.0)

## 2020-09-09 NOTE — Patient Instructions (Addendum)
Start Slow Fe only for iron -- this does not irritate belly  Anemia  Anemia is a condition in which there is not enough red blood cells or hemoglobin in the blood. Hemoglobin is a substance in red blood cells thatcarries oxygen. When you do not have enough red blood cells or hemoglobin (are anemic), your body cannot get enough oxygen and your organs may not work properly. Asa result, you may feel very tired or have other problems. What are the causes? Common causes of anemia include: Excessive bleeding. Anemia can be caused by excessive bleeding inside or outside the body, including bleeding from the intestines or from heavy menstrual periods in females. Poor nutrition. Long-lasting (chronic) kidney, thyroid, and liver disease. Bone marrow disorders, spleen problems, and blood disorders. Cancer and treatments for cancer. HIV (human immunodeficiency virus) and AIDS (acquired immunodeficiency syndrome). Infections, medicines, and autoimmune disorders that destroy red blood cells. What are the signs or symptoms? Symptoms of this condition include: Minor weakness. Dizziness. Headache, or difficulties concentrating and sleeping. Heartbeats that feel irregular or faster than normal (palpitations). Shortness of breath, especially with exercise. Pale skin, lips, and nails, or cold hands and feet. Indigestion and nausea. Symptoms may occur suddenly or develop slowly. If your anemia is mild, you maynot have symptoms. How is this diagnosed? This condition is diagnosed based on blood tests, your medical history, and a physical exam. In some cases, a test may be needed in which cells are removed from the soft tissue inside of a bone and looked at under a microscope (bone marrow biopsy). Your health care provider may also check your stool (feces) for blood and may do additional testing to look for the cause of yourbleeding. Other tests may include: Imaging tests, such as a CT scan or MRI. A procedure  to see inside your esophagus and stomach (endoscopy). A procedure to see inside your colon and rectum (colonoscopy). How is this treated? Treatment for this condition depends on the cause. If you continue to lose a lot of blood, you may need to be treated at a hospital. Treatment may include: Taking supplements of iron, vitamin Q76, or folic acid. Taking a hormone medicine (erythropoietin) that can help to stimulate red blood cell growth. Having a blood transfusion. This may be needed if you lose a lot of blood. Making changes to your diet. Having surgery to remove your spleen. Follow these instructions at home: Take over-the-counter and prescription medicines only as told by your health care provider. Take supplements only as told by your health care provider. Follow any diet instructions that you were given by your health care provider. Keep all follow-up visits as told by your health care provider. This is important. Contact a health care provider if: You develop new bleeding anywhere in the body. Get help right away if: You are very weak. You are short of breath. You have pain in your abdomen or chest. You are dizzy or feel faint. You have trouble concentrating. You have bloody stools, black stools, or tarry stools. You vomit repeatedly or you vomit up blood. These symptoms may represent a serious problem that is an emergency. Do not wait to see if the symptoms will go away. Get medical help right away. Call your local emergency services (911 in the [place] Do not drive yourself to the hospital. Summary Anemia is a condition in which you do not have enough red blood cells or enough of a substance in your red blood cells that carries oxygen (hemoglobin). Symptoms  may occur suddenly or develop slowly. If your anemia is mild, you may not have symptoms. This condition is diagnosed with blood tests, a medical history, and a physical exam. Other tests may be needed. Treatment for this  condition depends on the cause of the anemia. This information is not intended to replace advice given to you by your health care provider. Make sure you discuss any questions you have with your healthcare provider. Document Revised: 01/07/2019 Document Reviewed: 01/07/2019 Elsevier Patient Education  2022 [employer].

## 2020-09-09 NOTE — Assessment & Plan Note (Signed)
Chronic, ongoing with A1C 6.6% today.  Continue Benazepril for kidney protection (he continues to maintain without this) and may consider reduction of Metformin in future dependent on kidney function on labs.  Educated him on kidney disease and monitoring.  Consider referral to nephrology in future if ever any worsening of function noted -- recent labs stable.  Return in 3 months for T2DM check.

## 2020-09-09 NOTE — Assessment & Plan Note (Signed)
Chronic, ongoing.  Continue current medication regimen, tolerating 5 MG Crestor well without ADR, and adjust as needed.  Lipid panel today.  Return in 3 months.

## 2020-09-09 NOTE — Assessment & Plan Note (Signed)
Patient on Eliquis with A-fib, monitor CBC regularly. 

## 2020-09-09 NOTE — Assessment & Plan Note (Signed)
Chronic, ongoing with A1c 6.6% today.  Decreased sensation bilateral feet, monitor closely for wounds and falls.  Continue current medication regimen and adjust as needed.  Return in 3 months.

## 2020-09-09 NOTE — Assessment & Plan Note (Signed)
Chronic, ongoing.  Continue to monitor closely and refer to nephrology if decline -- recent labs stable.  Continue Benazepril for kidney protection (which he wishes to stay off of).  Renal dose medications as needed based on labs.

## 2020-09-09 NOTE — Assessment & Plan Note (Signed)
Chronic, ongoing.  Followed by pain management.  Continue this collaboration and current regimen.

## 2020-09-09 NOTE — Assessment & Plan Note (Signed)
Chronic, ongoing.  Continue current medication regimen and collaboration with cardiology.  Monitor CBC closely due to anemia.

## 2020-09-09 NOTE — Progress Notes (Signed)
BP 116/70   Pulse 66   Temp 98.5 F (36.9 C) (Oral)   Wt 188 lb 3.2 oz (85.4 kg)   SpO2 97%   BMI 24.83 kg/m    Subjective:    Patient ID: Sean Reyes., male    DOB: 24-Oct-1940, 80 y.o.   MRN: [id number]  HPI: Sean Reyes. is a 80 y.o. male  Chief Complaint  Patient presents with   Atrial Fibrillation   Diabetes    Patient states the eye doctor is supposed send over his recent Diabetic Eye Exam over to our office.    Hyperlipidemia   Hypertension   Back Pain    Patient states he is still having issues with pain in his back.    DIABETES Recent A1C in April 6.7%.  Continues on Metformin daily. Hypoglycemic episodes:no Polydipsia/polyuria: no Visual disturbance: no Chest pain: no Paresthesias: no Glucose Monitoring: yes             Accucheck frequency: a couple times week             Fasting glucose: 120 to 130 range             Post prandial:             Evening:             Before meals: Taking Insulin?: no             Long acting insulin:             Short acting insulin: Blood Pressure Monitoring: a few times a day Retinal Examination: Up to Date -- just had cataract surgery Foot Exam: Up to Date Pneumovax: Up to Date Influenza: Up to Date Aspirin: no    HYPERTENSION / HYPERLIPIDEMIA/A-FIB Followed by Dr. Nehemiah Massed for cardiology.  Last seen 05/06/20 by cardiology.  Patient stopped ACE on own months back (although he reports he occasionally will take one still if BP elevated, but not often) and decreased beta blocker (25 MG XL once daily). Continues on Eliquis, started over a year ago for atrial fibrillation. Taking Rosuvastatin 5 MG.  Continues Propafenone.   Satisfied with current treatment? yes Duration of hypertension: chronic BP monitoring frequency: a few times a week BP range: 120-130/80's BP medication side effects: no Duration of hyperlipidemia: chronic Cholesterol medication side effects: no Cholesterol supplements: none Medication  compliance: good compliance Aspirin: no Recent stressors: no Recurrent headaches: no Visual changes: no Palpitations: no Dyspnea: no Chest pain: no Lower extremity edema: no Dizzy/lightheaded: no  The ASCVD Risk score Mikey Bussing DC Jr., et al., 2013) failed to calculate for the following reasons:   The valid total cholesterol range is 130 to 320 mg/dL  CHRONIC KIDNEY DISEASE October improved labs GFR 57 and CRT 1.29. CKD status: stable Medications renally dose: yes Previous renal evaluation: no Pneumovax:  Up to Date Influenza Vaccine:  Up to Date   ANEMIA CBC H/H 11.8/36.1, MCV 92, B12 692, iron 665, ferritin 51 -- anemia present since his surgical procedure in May 2021.  Took regular iron but this caused constipation.  Discussed Slow Fe. 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  Saw Dr. Holley Raring with pain clinic last 08/02/20.  Was being followed by urology post surgery for issues post back surgery with urination -- continues Tamsulosin and Finasteride. Duration:  weeks Lower extremity edema: no Arthralgias:no Myalgias: no Weakness: no Rash: no  Relevant past medical, surgical, family and social history reviewed and updated as indicated. Interim medical history since our last visit reviewed. Allergies and medications reviewed and updated.  Review of Systems  Constitutional:  Negative for activity change, appetite change, diaphoresis, fatigue and fever.  Respiratory:  Negative for cough, chest tightness, shortness  of breath and wheezing.   Cardiovascular:  Negative for chest pain, palpitations and leg swelling.  Gastrointestinal: Negative.   Endocrine: Negative for cold intolerance, heat intolerance, polydipsia, polyphagia and polyuria.  Musculoskeletal:  Positive for arthralgias.  Neurological: Negative.   Psychiatric/Behavioral: Negative.     Per HPI unless specifically indicated above     Objective:    BP 116/70   Pulse 66   Temp 98.5 F (36.9 C) (Oral)   Wt 188 lb 3.2 oz (85.4 kg)   SpO2 97%   BMI 24.83 kg/m   Wt Readings from Last 3 Encounters:  09/09/20 188 lb 3.2 oz (85.4 kg)  08/06/20 186 lb (84.4 kg)  07/06/20 194 lb (88 kg)    Physical Exam Vitals and nursing note reviewed.  Constitutional:      General: He is awake. He is not in acute distress.    Appearance: He is well-developed and well-groomed. He is not ill-appearing.  HENT:     Head: Normocephalic and atraumatic.     Right Ear: Hearing normal. No drainage.     Left Ear: Hearing normal. No drainage.  Eyes:     General: Lids are normal.        Right eye: No discharge.        Left eye: No discharge.     Conjunctiva/sclera: Conjunctivae normal.     Pupils: Pupils are equal, round, and reactive to light.  Neck:     Thyroid: No thyromegaly.     Vascular: No carotid bruit.     Trachea: Trachea normal.  Cardiovascular:     Rate and Rhythm: Normal rate and regular rhythm.     Heart sounds: Normal heart sounds, S1 normal and S2 normal. No murmur heard.   No gallop.  Pulmonary:     Effort: Pulmonary effort is normal. No accessory muscle usage or respiratory distress.     Breath sounds: Normal breath sounds.  Abdominal:     General: Bowel sounds are normal. There is no distension.     Palpations: Abdomen is soft. There is no hepatomegaly.     Tenderness: There is no abdominal tenderness. There is no right CVA tenderness or left CVA tenderness.  Musculoskeletal:        General: Normal range of motion.     Cervical  back: Normal range of motion and neck supple.     Right lower leg: No edema.     Left lower leg: No edema.  Lymphadenopathy:     Cervical: No cervical adenopathy.  Skin:    General: Skin is warm and dry.     Capillary Refill: Capillary refill takes less than 2 seconds.  Neurological:     Mental Status: He is alert and oriented to person, place, and time.     Deep Tendon Reflexes:     Reflex  Scores:      Patellar reflexes are 1+ on the right side and 1+ on the left side.      Achilles reflexes are 1+ on the right side and 1+ on the left side.    Comments: Antalgic gait, using cane.  Psychiatric:        Attention and Perception: Attention normal.        Mood and Affect: Mood normal.        Speech: Speech normal.        Behavior: Behavior normal. Behavior is cooperative.        Thought Content: Thought content normal.   Results for orders placed or performed in visit on 06/10/20  CBC with Differential/Platelet  Result Value Ref Range   WBC 5.9 3.4 - 10.8 x10E3/uL   RBC 3.91 (L) 4.14 - 5.80 x10E6/uL   Hemoglobin 11.8 (L) 13.0 - 17.7 g/dL   Hematocrit 36.1 (L) 37.5 - 51.0 %   MCV 92 79 - 97 fL   MCH 30.2 26.6 - 33.0 pg   MCHC 32.7 31.5 - 35.7 g/dL   RDW 13.8 11.6 - 15.4 %   Platelets 175 150 - 450 x10E3/uL   Neutrophils 60 Not Estab. %   Lymphs 27 Not Estab. %   Monocytes 10 Not Estab. %   Eos 3 Not Estab. %   Basos 0 Not Estab. %   Neutrophils Absolute 3.5 1.4 - 7.0 x10E3/uL   Lymphocytes Absolute 1.6 0.7 - 3.1 x10E3/uL   Monocytes Absolute 0.6 0.1 - 0.9 x10E3/uL   EOS (ABSOLUTE) 0.2 0.0 - 0.4 x10E3/uL   Basophils Absolute 0.0 0.0 - 0.2 x10E3/uL   Immature Granulocytes 0 Not Estab. %   Immature Grans (Abs) 0.0 0.0 - 0.1 x10E3/uL  Comprehensive metabolic panel  Result Value Ref Range   Glucose 198 (H) 65 - 99 mg/dL   BUN 20 8 - 27 mg/dL   Creatinine, Ser 1.29 (H) 0.76 - 1.27 mg/dL   eGFR 57 (L) >59 mL/min/1.73   BUN/Creatinine Ratio 16 10 - 24   Sodium 137 134 - 144  mmol/L   Potassium 4.5 3.5 - 5.2 mmol/L   Chloride 98 96 - 106 mmol/L   CO2 24 20 - 29 mmol/L   Calcium 9.6 8.6 - 10.2 mg/dL   Total Protein 6.7 6.0 - 8.5 g/dL   Albumin 4.1 3.7 - 4.7 g/dL   Globulin, Total 2.6 1.5 - 4.5 g/dL   Albumin/Globulin Ratio 1.6 1.2 - 2.2   Bilirubin Total 0.4 0.0 - 1.2 mg/dL   Alkaline Phosphatase 69 44 - 121 IU/L   AST 16 0 - 40 IU/L   ALT 19 0 - 44 IU/L  Bayer DCA Hb A1c Waived  Result Value Ref Range   HB A1C (BAYER DCA - WAIVED) 6.7 <7.0 %  Iron, TIBC and Ferritin Panel  Result Value Ref Range   Total Iron Binding Capacity 342 250 - 450 ug/dL   UIBC 277 111 - 343 ug/dL   Iron 65 38 - 169 ug/dL   Iron Saturation 19 15 - 55 %   Ferritin 51 30 - 400 ng/mL  Folate  Result Value Ref Range   Folate 9.6 >3.0 ng/mL      Assessment & Plan:   Problem List Items Addressed This Visit       Cardiovascular and Mediastinum   Hypertension associated with diabetes ([place])    Chronic, stable with BP at goal for age in office and on home  readings.  A1C 6.6% today. Continue current medication regimen and collaboration with cardiology.  Recommend he continue to check BP at home at least 3 mornings a week and document.  BMP today.  BP is stable with current regimen, recommend we keep ACE on board for kidney protection in his diabetes and can cut back to 20 MG if needed for lower levels -- he wishes to stay without it. Return in 3 months.       Relevant Orders   Bayer DCA Hb A1c Waived   Paroxysmal atrial fibrillation (HCC)    Chronic, ongoing.  Continue current medication regimen and collaboration with cardiology.  Monitor CBC closely due to anemia.         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  Decreased sensation bilateral feet, monitor closely for wounds and falls.  Continue current medication regimen and adjust as needed.  Return in 3 months.       Type 2 diabetes mellitus with  diabetic chronic kidney disease (HCC)    Chronic, ongoing with A1C 6.6% today.  Continue Benazepril for kidney protection (he continues to maintain without this) and may consider reduction of Metformin in future dependent on kidney function on labs.  Educated him on kidney disease and monitoring.  Consider referral to nephrology in future if ever any worsening of function noted -- recent labs stable.  Return in 3 months for T2DM check.       Relevant Orders   Bayer DCA Hb A1c Waived   Basic metabolic panel   Hyperlipidemia associated with type 2 diabetes mellitus (HCC)    Chronic, ongoing.  Continue current medication regimen, tolerating 5 MG Crestor well without ADR, and adjust as needed.  Lipid panel today.  Return in 3 months.       Relevant Orders   Bayer DCA Hb A1c Waived   Lipid Panel w/o Chol/HDL Ratio     Genitourinary   CKD (chronic kidney disease) stage 3, GFR 30-59 ml/min (HCC)    Chronic, ongoing.  Continue to monitor closely and refer to nephrology if decline -- recent labs stable.  Continue Benazepril for kidney protection (which he wishes to stay off of).  Renal dose medications as needed based on labs.         Hematopoietic and Hemostatic   Acquired thrombophilia ([place])    Patient on Eliquis with A-fib, monitor CBC regularly.         Other   Anemia    CBC, iron, ferritin, folate today, patient on Eliquis.  Will obtain stool sample as well, consider referral to GI.       Relevant Orders   CBC with Differential/Platelet   Iron, TIBC and Ferritin Panel   Fecal occult blood, imunochemical   Chronic pain syndrome    Chronic, ongoing.  Followed by pain management.  Continue this collaboration and current regimen.         Follow up plan: Return in about 3 months (around 12/10/2020) for HTN/HLD, GERD, A-FIB, T2DM, ANEMIA.

## 2020-09-09 NOTE — Assessment & Plan Note (Addendum)
Chronic, stable with BP at goal for age in office and on home readings.  A1C 6.6% today. Continue current medication regimen and collaboration with cardiology.  Recommend he continue to check BP at home at least 3 mornings a week and document.  BMP today.  BP is stable with current regimen, recommend we keep ACE on board for kidney protection in his diabetes and can cut back to 20 MG if needed for lower levels -- he wishes to stay without it. Return in 3 months.

## 2020-09-09 NOTE — Assessment & Plan Note (Signed)
CBC, iron, ferritin, folate today, patient on Eliquis.  Will obtain stool sample as well, consider referral to GI.

## 2020-09-10 ENCOUNTER — Ambulatory Visit (INDEPENDENT_AMBULATORY_CARE_PROVIDER_SITE_OTHER): Payer: Medicare Other

## 2020-09-10 VITALS — Ht 73.0 in | Wt 187.0 lb

## 2020-09-10 DIAGNOSIS — Z0000 Encounter for general adult medical examination without abnormal findings: Secondary | ICD-10-CM | POA: Diagnosis not present

## 2020-09-10 LAB — IRON,TIBC AND FERRITIN PANEL
Ferritin: 66 ng/mL (ref 30–400)
Iron Saturation: 20 % (ref 15–55)
Iron: 58 ug/dL (ref 38–169)
Total Iron Binding Capacity: 297 ug/dL (ref 250–450)
UIBC: 239 ug/dL (ref 111–343)

## 2020-09-10 LAB — BASIC METABOLIC PANEL
BUN/Creatinine Ratio: 14 (ref 10–24)
BUN: 19 mg/dL (ref 8–27)
CO2: 22 mmol/L (ref 20–29)
Calcium: 9.3 mg/dL (ref 8.6–10.2)
Chloride: 98 mmol/L (ref 96–106)
Creatinine, Ser: 1.38 mg/dL — ABNORMAL HIGH (ref 0.76–1.27)
Glucose: 187 mg/dL — ABNORMAL HIGH (ref 65–99)
Potassium: 4.1 mmol/L (ref 3.5–5.2)
Sodium: 137 mmol/L (ref 134–144)
eGFR: 52 mL/min/{1.73_m2} — ABNORMAL LOW (ref >59)

## 2020-09-10 LAB — CBC WITH DIFFERENTIAL/PLATELET
Basophils Absolute: 0 10*3/uL (ref 0.0–0.2)
Basos: 0 %
EOS (ABSOLUTE): 0.1 10*3/uL (ref 0.0–0.4)
Eos: 1 %
Hematocrit: 33.8 % — ABNORMAL LOW (ref 37.5–51.0)
Hemoglobin: 11.1 g/dL — ABNORMAL LOW (ref 13.0–17.7)
Immature Grans (Abs): 0 10*3/uL (ref 0.0–0.1)
Immature Granulocytes: 0 %
Lymphocytes Absolute: 1.6 10*3/uL (ref 0.7–3.1)
Lymphs: 28 %
MCH: 30.4 pg (ref 26.6–33.0)
MCHC: 32.8 g/dL (ref 31.5–35.7)
MCV: 93 fL (ref 79–97)
Monocytes Absolute: 0.6 10*3/uL (ref 0.1–0.9)
Monocytes: 12 %
Neutrophils Absolute: 3.2 10*3/uL (ref 1.4–7.0)
Neutrophils: 59 %
Platelets: 158 10*3/uL (ref 150–450)
RBC: 3.65 x10E6/uL — ABNORMAL LOW (ref 4.14–5.80)
RDW: 13.6 % (ref 11.6–15.4)
WBC: 5.5 10*3/uL (ref 3.4–10.8)

## 2020-09-10 LAB — LIPID PANEL W/O CHOL/HDL RATIO
Cholesterol, Total: 105 mg/dL (ref 100–199)
HDL: 41 mg/dL (ref >39)
LDL Chol Calc (NIH): 43 mg/dL (ref 0–99)
Triglycerides: 113 mg/dL (ref 0–149)
VLDL Cholesterol Cal: 21 mg/dL (ref 5–40)

## 2020-09-10 NOTE — Progress Notes (Signed)
Contacted via [facility], your labs have returned: - CBC continues to show ongoing anemia, which has been present since your surgery last year.  Iron on low side of normal, start taking the Slow Fe as I recommended, which will be easier on your belly.  Return stool sample when you can.  I may send you to GI upon return of this to ensure no bleeding from stomach area. - Cholesterol levels are at goal. - Kidney function continues to show some mild kidney disease, we will continue to monitor this and try to take even 1/2 a dose of Benazepril to help kidney health.  Any questions? Keep being awesome!!  Thank you for allowing me to participate in your care.  I appreciate you. Kindest regards, Sailor Hevia

## 2020-09-10 NOTE — Patient Instructions (Signed)
Sean Reyes , Thank you for taking time to come for your Medicare Wellness Visit. I appreciate your ongoing commitment to your health goals. Please review the following plan we discussed and let me know if I can assist you in the future.   Screening recommendations/referrals: Colonoscopy: not required Recommended yearly ophthalmology/optometry visit for glaucoma screening and checkup Recommended yearly dental visit for hygiene and checkup  Vaccinations: Influenza vaccine: due 09/13/2020 Pneumococcal vaccine: completed 06/02/2015 Tdap vaccine: completed 06/04/2020, due 06/05/2030 Shingles vaccine: completed   Covid-19:  12/16/2019, 03/17/2019, 02/24/2019  Advanced directives: Please bring a copy of your POA (Power of Attorney) and/or Living Will to your next appointment.   Conditions/risks identified: none  Next appointment: Follow up in one year for your annual wellness visit.   Preventive Care 70 Years and Older, Male Preventive care refers to lifestyle choices and visits with your health care provider that can promote health and wellness. What does preventive care include? A yearly physical exam. This is also called an annual well check. Dental exams once or twice a year. Routine eye exams. Ask your health care provider how often you should have your eyes checked. Personal lifestyle choices, including: Daily care of your teeth and gums. Regular physical activity. Eating a healthy diet. Avoiding tobacco and drug use. Limiting alcohol use. Practicing safe sex. Taking low doses of aspirin every day. Taking vitamin and mineral supplements as recommended by your health care provider. What happens during an annual well check? The services and screenings done by your health care provider during your annual well check will depend on your age, overall health, lifestyle risk factors, and family history of disease. Counseling  Your health care provider may ask you questions about your: Alcohol  use. Tobacco use. Drug use. Emotional well-being. Home and relationship well-being. Sexual activity. Eating habits. History of falls. Memory and ability to understand (cognition). Work and work Statistician. Screening  You may have the following tests or measurements: Height, weight, and BMI. Blood pressure. Lipid and cholesterol levels. These may be checked every 5 years, or more frequently if you are over 36 years old. Skin check. Lung cancer screening. You may have this screening every year starting at age 78 if you have a 30-pack-year history of smoking and currently smoke or have quit within the past 15 years. Fecal occult blood test (FOBT) of the stool. You may have this test every year starting at age 62. Flexible sigmoidoscopy or colonoscopy. You may have a sigmoidoscopy every 5 years or a colonoscopy every 10 years starting at age 33. Prostate cancer screening. Recommendations will vary depending on your family history and other risks. Hepatitis C blood test. Hepatitis B blood test. Sexually transmitted disease (STD) testing. Diabetes screening. This is done by checking your blood sugar (glucose) after you have not eaten for a while (fasting). You may have this done every 1-3 years. Abdominal aortic aneurysm (AAA) screening. You may need this if you are a current or former smoker. Osteoporosis. You may be screened starting at age 11 if you are at high risk. Talk with your health care provider about your test results, treatment options, and if necessary, the need for more tests. Vaccines  Your health care provider may recommend certain vaccines, such as: Influenza vaccine. This is recommended every year. Tetanus, diphtheria, and acellular pertussis (Tdap, Td) vaccine. You may need a Td booster every 10 years. Zoster vaccine. You may need this after age 35. Pneumococcal 13-valent conjugate (PCV13) vaccine. One dose is recommended  after age 52. Pneumococcal polysaccharide  (PPSV23) vaccine. One dose is recommended after age 35. Talk to your health care provider about which screenings and vaccines you need and how often you need them. This information is not intended to replace advice given to you by your health care provider. Make sure you discuss any questions you have with your health care provider. Document Released: 02/26/2015 Document Revised: 10/20/2015 Document Reviewed: 12/01/2014 Elsevier Interactive Patient Education  2017 [employer] Prevention in the Home Falls can cause injuries. They can happen to people of all ages. There are many things you can do to make your home safe and to help prevent falls. What can I do on the outside of my home? Regularly fix the edges of walkways and driveways and fix any cracks. Remove anything that might make you trip as you walk through a door, such as a raised step or threshold. Trim any bushes or trees on the path to your home. Use bright outdoor lighting. Clear any walking paths of anything that might make someone trip, such as rocks or tools. Regularly check to see if handrails are loose or broken. Make sure that both sides of any steps have handrails. Any raised decks and porches should have guardrails on the edges. Have any leaves, snow, or ice cleared regularly. Use sand or salt on walking paths during winter. Clean up any spills in your garage right away. This includes oil or grease spills. What can I do in the bathroom? Use night lights. Install grab bars by the toilet and in the tub and shower. Do not use towel bars as grab bars. Use non-skid mats or decals in the tub or shower. If you need to sit down in the shower, use a plastic, non-slip stool. Keep the floor dry. Clean up any water that spills on the floor as soon as it happens. Remove soap buildup in the tub or shower regularly. Attach bath mats securely with double-sided non-slip rug tape. Do not have throw rugs and other things on the  floor that can make you trip. What can I do in the bedroom? Use night lights. Make sure that you have a light by your bed that is easy to reach. Do not use any sheets or blankets that are too big for your bed. They should not hang down onto the floor. Have a firm chair that has side arms. You can use this for support while you get dressed. Do not have throw rugs and other things on the floor that can make you trip. What can I do in the kitchen? Clean up any spills right away. Avoid walking on wet floors. Keep items that you use a lot in easy-to-reach places. If you need to reach something above you, use a strong step stool that has a grab bar. Keep electrical cords out of the way. Do not use floor polish or wax that makes floors slippery. If you must use wax, use non-skid floor wax. Do not have throw rugs and other things on the floor that can make you trip. What can I do with my stairs? Do not leave any items on the stairs. Make sure that there are handrails on both sides of the stairs and use them. Fix handrails that are broken or loose. Make sure that handrails are as long as the stairways. Check any carpeting to make sure that it is firmly attached to the stairs. Fix any carpet that is loose or worn. Avoid having throw  rugs at the top or bottom of the stairs. If you do have throw rugs, attach them to the floor with carpet tape. Make sure that you have a light switch at the top of the stairs and the bottom of the stairs. If you do not have them, ask someone to add them for you. What else can I do to help prevent falls? Wear shoes that: Do not have high heels. Have rubber bottoms. Are comfortable and fit you well. Are closed at the toe. Do not wear sandals. If you use a stepladder: Make sure that it is fully opened. Do not climb a closed stepladder. Make sure that both sides of the stepladder are locked into place. Ask someone to hold it for you, if possible. Clearly mark and make  sure that you can see: Any grab bars or handrails. First and last steps. Where the edge of each step is. Use tools that help you move around (mobility aids) if they are needed. These include: Canes. Walkers. Scooters. Crutches. Turn on the lights when you go into a dark area. Replace any light bulbs as soon as they burn out. Set up your furniture so you have a clear path. Avoid moving your furniture around. If any of your floors are uneven, fix them. If there are any pets around you, be aware of where they are. Review your medicines with your doctor. Some medicines can make you feel dizzy. This can increase your chance of falling. Ask your doctor what other things that you can do to help prevent falls. This information is not intended to replace advice given to you by your health care provider. Make sure you discuss any questions you have with your health care provider. Document Released: 11/26/2008 Document Revised: 07/08/2015 Document Reviewed: 03/06/2014 Elsevier Interactive Patient Education  2017 [employer].

## 2020-09-10 NOTE — Progress Notes (Signed)
I connected with Sean Reyes today by telephone and verified that I am speaking with the correct person using two identifiers. Location patient: home Location provider: work Persons participating in the virtual visit: Wassim, Sholler LPN.   I discussed the limitations, risks, security and privacy concerns of performing an evaluation and management service by telephone and the availability of in person appointments. I also discussed with the patient that there may be a patient responsible charge related to this service. The patient expressed understanding and verbally consented to this telephonic visit.    Interactive audio and video telecommunications were attempted between this provider and patient, however failed, due to patient having technical difficulties OR patient did not have access to video capability.  We continued and completed visit with audio only.     Vital signs may be patient reported or missing.  Subjective:   Sean Hall. is a 80 y.o. male who presents for Medicare Annual/Subsequent preventive examination.  Review of Systems     Cardiac Risk Factors include: advanced age (>45mn, >>25women);diabetes mellitus;male gender     Objective:    Today's Vitals   09/10/20 0856 09/10/20 0857  Weight: 187 lb (84.8 kg)   Height: '6\' 1"'$  (1.854 m)   PainSc:  6    Body mass index is 24.67 kg/m.  Advanced Directives 09/10/2020 09/08/2019 06/25/2019 09/24/2018 08/29/2018 08/12/2018 07/22/2018  Does Patient Have a Medical Advance Directive? Yes Yes No Yes Yes Yes Yes  Type of AParamed[place]of [place] will [place] will - (No Data) Living will;Healthcare Power of Attorney Living will;Healthcare Power of Attorney Living will  Does patient want to make changes to medical advance directive? - - - No - Patient declined - - No - Patient declined  Copy of [place] Chart? No - copy requested No - copy  requested - - No - copy requested No - copy requested No - copy requested  Would patient like information on creating a medical advance directive? - - - - - - -    Current Medications (verified) Outpatient Encounter Medications as of 09/10/2020  Medication Sig   acetaminophen (TYLENOL) 500 MG tablet Take 500 mg by mouth every 6 (six) hours as needed (pain).   ELIQUIS 5 MG TABS tablet Take 1 tablet (5 mg total) by mouth 2 (two) times daily.   ferrous sulfate 325 (65 FE) MG tablet Take by mouth. (Patient not taking: Reported on 09/09/2020)   finasteride (PROSCAR) 5 MG tablet Take 1 tablet (5 mg total) by mouth daily.   gabapentin (NEURONTIN) 300 MG capsule Take 1 capsule (300 mg total) by mouth 2 (two) times daily.   metFORMIN (GLUCOPHAGE-XR) 500 MG 24 hr tablet Take 2 tablets (1,000 mg total) by mouth 2 (two) times daily.   metoprolol succinate (TOPROL-XL) 25 MG 24 hr tablet Take 1 tablet by mouth daily.   pantoprazole (PROTONIX) 40 MG tablet Take 40 mg by mouth at bedtime.    propafenone (RYTHMOL SR) 425 MG 12 hr capsule Take 425 mg by mouth 2 (two) times daily.   rosuvastatin (CRESTOR) 5 MG tablet Take 1 tablet (5 mg total) by mouth daily.   tamsulosin (FLOMAX) 0.4 MG CAPS capsule Take 0.8 mg by mouth daily.   trimethoprim-polymyxin b (POLYTRIM) ophthalmic solution Apply to eye.   No facility-administered encounter medications on file as of 09/10/2020.    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   Hyperlipidemia    Hypertension    Nocturia    S/P ablation of atrial fibrillation    Ablative therapy   Sleep apnea    CPAP    Spinal stenosis    Tachycardia, unspecified    Past Surgical History:  Procedure Laterality Date   ABLATION     ANTERIOR LAT LUMBAR FUSION N/A 06/27/2017   Procedure: Anterior Lateral Lumbar Interbody  Fusion - Lumbar Two-Lumbar Three - Lumbar Three-Lumbar Four, Posterior Lumbar Interbody Fusion Lumbar Four- Five;  Surgeon: Kary Kos, MD;  Location: [place];  Service: Neurosurgery;  Laterality: N/A;  Anterior Lateral Lumbar Interbody  Fusion - Lumbar Two-Lumbar Three - Lumbar Three-Lumbar Four, Posterior Lumbar Interbody Fusion Lumbar Four- Five   BACK SURGERY     CARDIOVERSION N/A 08/29/2018   Procedure: CARDIOVERSION;  Surgeon: Corey Skains, MD;  Location: [facility];  Service: Cardiovascular;  Laterality: N/A;   CARDIOVERSION N/A 09/24/2018   Procedure: CARDIOVERSION;  Surgeon: Corey Skains, MD;  Location: [facility];  Service: Cardiovascular;  Laterality: N/A;   COLONOSCOPY WITH PROPOFOL N/A 10/05/2015   Procedure: COLONOSCOPY WITH PROPOFOL;  Surgeon: Lollie Sails, MD;  Location: [facility] ENDOSCOPY;  Service: Endoscopy;  Laterality: N/A;   ESOPHAGOGASTRODUODENOSCOPY (EGD) WITH PROPOFOL N/A 04/01/2018   Procedure: ESOPHAGOGASTRODUODENOSCOPY (EGD) WITH PROPOFOL;  Surgeon: Lollie Sails, MD;  Location: [facility] ENDOSCOPY;  Service: Endoscopy;  Laterality: N/A;   HERNIA REPAIR     JOINT REPLACEMENT Bilateral    hips  RT+  LEFT X2    LUMBAR LAMINECTOMY/DECOMPRESSION MICRODISCECTOMY Left 09/13/2016   Procedure: Microdiscectomy - Lumbar two-three,  Lumbar three- - left;  Surgeon: Kary Kos, MD;  Location: [place];  Service: Neurosurgery;  Laterality: Left;   SPINAL CORD STIMULATOR INSERTION  07/08/2019   TONSILLECTOMY     Family History  Problem Relation Age of Onset   Brain cancer  Mother    Kidney disease Neg Hx    Prostate cancer Neg Hx    Kidney cancer Neg Hx    Bladder Cancer Neg Hx    Social History   Socioeconomic History   Marital status: Married    Spouse name: Malachy Mood    Number of children: 2   Years of education: Not on file   Highest education level: High school graduate  Occupational History   Occupation: retired   Tobacco Use   Smoking status: Never   Smokeless tobacco: Never  Vaping Use   Vaping Use: Never used  Substance and Sexual Activity   Alcohol use: No   Drug use: No   Sexual activity: Not on file  Other Topics Concern   Not on file  Social History Narrative   Married   Gets regular exercise   Social Determinants of Health   Financial Resource Strain: Low Risk  Difficulty of Paying Living Expenses: Not hard at all  Food Insecurity: No Food Insecurity   Worried About [place] in the Last Year: Never true   Ran Out of Food in the Last Year: Never true  Transportation Needs: No Transportation Needs   Lack of Transportation (Medical): No   Lack of Transportation (Non-Medical): No  Physical Activity: Inactive   Days of Exercise per Week: 0 days   Minutes of Exercise per Session: 0 min  Stress: No Stress Concern Present   Feeling of Stress : Not at all  Social Connections: Not on file    Tobacco Counseling Counseling given: Not Answered   Clinical Intake:  Pre-visit preparation completed: Yes  Pain : 0-10 Pain Score: 6  Pain Type: Chronic pain Pain Location: Back Pain Orientation: Lower Pain Descriptors / Indicators: Aching Pain Onset: More than a month ago Pain Frequency: Constant     Nutritional Status: BMI of 19-24  Normal Nutritional Risks: Nausea/ vomitting/ diarrhea (diarrhea lately on and off) Diabetes: Yes  How often do you need to have someone help you when you read instructions, pamphlets, or other written materials from your doctor or pharmacy?: 1 - Never What is the last grade level  you completed in school?: 12th grade  Diabetic? Yes Nutrition Risk Assessment:  Has the patient had any N/V/D within the last 2 months?  Yes  Does the patient have any non-healing wounds?  No  Has the patient had any unintentional weight loss or weight gain?  Yes   Diabetes:  Is the patient diabetic?  Yes  If diabetic, was a CBG obtained today?  No  Did the patient bring in their glucometer from home?  No  How often do you monitor your CBG's? Twice weekly.   Financial Strains and Diabetes Management:  Are you having any financial strains with the device, your supplies or your medication? No .  Does the patient want to be seen by Chronic Care Management for management of their diabetes?  No  Would the patient like to be referred to a Nutritionist or for Diabetic Management?  No   Diabetic Exams:  Diabetic Eye Exam: Completed 08/2020 Diabetic Foot Exam: Completed 08/06/2020   Interpreter Needed?: No  Information entered by :: NAllen LPN   Activities of Daily Living In your present state of health, do you have any difficulty performing the following activities: 09/10/2020 09/09/2020  Hearing? Y N  Comment hearing aides -  Vision? N N  Difficulty concentrating or making decisions? N N  Walking or climbing stairs? Y N  Comment due to back -  Dressing or bathing? N N  Doing errands, shopping? N N  Preparing Food and eating ? N -  Using the Toilet? N -  In the past six months, have you accidently leaked urine? N -  Do you have problems with loss of bowel control? N -  Managing your Medications? N -  Managing your Finances? N -  Housekeeping or managing your Housekeeping? N -  Some recent data might be hidden    Patient Care Team: [name], NP as PCP - General (Nurse Practitioner) [name], MD as PCP - Family Medicine (Family Medicine) Corey Skains, MD as Consulting Physician (Internal Medicine) Ralene Bathe, MD (Dermatology) Kary Kos, MD as  Consulting Physician (Neurosurgery) Marry Guan, Laurice Record, MD (Orthopedic Surgery) Marlaine Hind, MD as Consulting Physician (Physical Medicine and Rehabilitation) Christy Sartorius, MD as Referring Physician (Urology)  Vanita Ingles, RN as Case Manager (General Practice)  Indicate any recent Medical Services you may have received from other than [facility] providers in the past year (date may be approximate).     Assessment:   This is a routine wellness examination for Suarez.  Hearing/Vision screen Vision Screening - Comments:: Regular eye exams, [facility]  Dietary issues and exercise activities discussed: Current Exercise Habits: The patient does not participate in regular exercise at present   Goals Addressed             This Visit's Progress    Patient Stated       09/10/2020, get back better       Depression Screen PHQ 2/9 Scores 09/10/2020 02/26/2020 09/08/2019 01/20/2019 08/12/2018 07/27/2017 05/02/2017  PHQ - 2 Score 0 0 0 0 0 0 0  PHQ- 9 Score - - - - - - 0    Fall Risk Fall Risk  09/10/2020 07/06/2020 04/08/2020 02/26/2020 12/11/2019  Falls in the past year? 0 0 0 1 1  Number falls in past yr: - 0 - 0 0  Injury with Fall? - 0 - 0 0  Risk Factor Category  - - - - -  Risk for fall due to : Medication side effect - - - -  Follow up Falls evaluation completed;Education provided;Falls prevention discussed - - - -    FALL RISK PREVENTION PERTAINING TO THE HOME:  Any stairs in or around the home? Yes  If so, are there any without handrails? No  Home free of loose throw rugs in walkways, pet beds, electrical cords, etc? Yes  Adequate lighting in your home to reduce risk of falls? Yes   ASSISTIVE DEVICES UTILIZED TO PREVENT FALLS:  Life alert? No  Use of a cane, walker or w/c? Yes  Grab bars in the bathroom? Yes  Shower chair or bench in shower? Yes  Elevated toilet seat or a handicapped toilet? Yes   TIMED UP AND GO:  Was the test performed? No .      Cognitive  Function:     6CIT Screen 09/10/2020 09/08/2019 01/20/2019 08/12/2018 07/27/2017  What Year? 0 points 0 points 0 points 0 points 0 points  What month? 0 points 0 points 0 points 0 points 0 points  What time? 0 points 0 points 0 points 0 points 0 points  Count back from 20 0 points 0 points 0 points 0 points 0 points  Months in reverse 0 points 0 points 0 points 0 points 0 points  Repeat phrase 0 points 2 points 0 points 0 points 0 points  Total Score 0 2 0 0 0    Immunizations Immunization History  Administered Date(s) Administered   Influenza, High Dose Seasonal PF 11/16/2015, 12/05/2016   Influenza,inj,Quad PF,6+ Mos 12/01/2014   Influenza-Unspecified 11/05/2017, 11/14/2018   PFIZER(Purple Top)SARS-COV-2 Vaccination 02/24/2019, 03/17/2019, 12/16/2019   Pneumococcal Conjugate-13 09/03/2013   Pneumococcal Polysaccharide-23 06/02/2015   Td 06/02/2015   Tdap 06/04/2020   Zoster Recombinat (Shingrix) 08/28/2017, 12/06/2017, 01/01/2018   Zoster, Live 02/14/2011    TDAP status: Up to date  Flu Vaccine status: Up to date  Pneumococcal vaccine status: Up to date  Covid-19 vaccine status: Completed vaccines  Qualifies for Shingles Vaccine? Yes   Zostavax completed Yes   Shingrix Completed?: Yes  Screening Tests Health Maintenance  Topic Date Due   COVID-19 Vaccine (4 - Booster for Pfizer series) 03/17/2020   OPHTHALMOLOGY EXAM  05/13/2020   INFLUENZA VACCINE  09/13/2020   HEMOGLOBIN A1C  03/12/2021   FOOT EXAM  08/06/2021   TETANUS/TDAP  06/05/2030   Hepatitis C Screening  Completed   PNA vac Low Risk Adult  Completed   Zoster Vaccines- Shingrix  Completed   HPV VACCINES  Aged Out    Health Maintenance  Health Maintenance Due  Topic Date Due   COVID-19 Vaccine (4 - Booster for Pfizer series) 03/17/2020   OPHTHALMOLOGY EXAM  05/13/2020    Colorectal cancer screening: No longer required.   Lung Cancer Screening: (Low Dose CT Chest recommended if Age 63-80 years, 30  pack-year currently smoking OR have quit w/in 15years.) does not qualify.   Lung Cancer Screening Referral: no  Additional Screening:  Hepatitis C Screening: does qualify; Completed 02/26/2020  Vision Screening: Recommended annual ophthalmology exams for early detection of glaucoma and other disorders of the eye. Is the patient up to date with their annual eye exam?  Yes  Who is the provider or what is the name of the office in which the patient attends annual eye exams? [place] If pt is not established with a provider, would they like to be referred to a provider to establish care? No .   Dental Screening: Recommended annual dental exams for proper oral hygiene  Community Resource Referral / Chronic Care Management: CRR required this visit?  No   CCM required this visit?  No      Plan:     I have personally reviewed and noted the following in the patient's chart:   Medical and social history Use of alcohol, tobacco or illicit drugs  Current medications and supplements including opioid prescriptions. Patient is not currently taking opioid prescriptions. Functional ability and status Nutritional status Physical activity Advanced directives List of other physicians Hospitalizations, surgeries, and ER visits in previous 12 months Vitals Screenings to include cognitive, depression, and falls Referrals and appointments  In addition, I have reviewed and discussed with patient certain preventive protocols, quality metrics, and best practice recommendations. A written personalized care plan for preventive services as well as general preventive health recommendations were provided to patient.     Kellie Simmering, LPN   [id number]   Nurse Notes:

## 2020-09-14 ENCOUNTER — Other Ambulatory Visit: Payer: Self-pay

## 2020-09-14 ENCOUNTER — Other Ambulatory Visit: Payer: Medicare Other

## 2020-09-14 DIAGNOSIS — D649 Anemia, unspecified: Secondary | ICD-10-CM | POA: Diagnosis not present

## 2020-09-15 ENCOUNTER — Other Ambulatory Visit: Payer: Self-pay | Admitting: Nurse Practitioner

## 2020-09-15 DIAGNOSIS — R195 Other fecal abnormalities: Secondary | ICD-10-CM

## 2020-09-15 DIAGNOSIS — D649 Anemia, unspecified: Secondary | ICD-10-CM

## 2020-09-15 LAB — FECAL OCCULT BLOOD, IMMUNOCHEMICAL: Fecal Occult Bld: POSITIVE — AB

## 2020-09-15 NOTE — Progress Notes (Signed)
Good morning, please let Shell know his stool returned positive for blood.  With his ongoing anemia and blood in stool I have placed a referral to GI for further assessment on this and ensuring anemia is not coming from any bleeding in his belly.  If any questions let me know:) Keep being awesome!!  Thank you for allowing me to participate in your care.  I appreciate you. Kindest regards, Shawn Carattini

## 2020-09-28 ENCOUNTER — Other Ambulatory Visit: Payer: Self-pay | Admitting: Nurse Practitioner

## 2020-09-28 NOTE — Telephone Encounter (Signed)
Requested medication (s) are on the active medication list: yes   Last refill:  06/16/2020  Future visit scheduled: yes   Notes to clinic:  failed protocol: HGB in normal range and within 360 days   HCT in normal range and within 360 days   Cr in normal range and within 360 days     Requested Prescriptions  Pending Prescriptions Disp Refills   ELIQUIS 5 MG TABS tablet [Pharmacy Med Name: ELIQUIS 5 MG TABLET] 180 tablet 0    Sig: Take 1 tablet (5 mg total) by mouth 2 (two) times daily.     Hematology:  Anticoagulants Failed - 09/28/2020 12:00 PM      Failed - HGB in normal range and within 360 days    Hemoglobin  Date Value Ref Range Status  09/09/2020 11.1 (L) 13.0 - 17.7 g/dL Final          Failed - HCT in normal range and within 360 days    Hematocrit  Date Value Ref Range Status  09/09/2020 33.8 (L) 37.5 - 51.0 % Final          Failed - Cr in normal range and within 360 days    Creatinine  Date Value Ref Range Status  07/01/2012 1.27 0.60 - 1.30 mg/dL Final   Creatinine, Ser  Date Value Ref Range Status  09/09/2020 1.38 (H) 0.76 - 1.27 mg/dL Final          Passed - PLT in normal range and within 360 days    Platelets  Date Value Ref Range Status  09/09/2020 158 150 - 450 x10E3/uL Final          Passed - Valid encounter within last 12 months    Recent Outpatient Visits           2 weeks ago Type 2 diabetes mellitus with diabetic autonomic neuropathy, without long-term current use of insulin ([place])   [place], Jolene T, NP   1 month ago Type 2 diabetes mellitus with diabetic autonomic neuropathy, without long-term current use of insulin ([place])   [place], [place] T, NP   3 months ago Type 2 diabetes mellitus with stage 3a chronic kidney disease, without long-term current use of insulin ([place])   [place], Jolene T, NP   7 months ago Type 2 diabetes mellitus with stage 3a chronic kidney  disease, without long-term current use of insulin ([place])   [place], Jolene T, NP   10 months ago Type 2 diabetes mellitus with stage 3a chronic kidney disease, without long-term current use of insulin ([place])   [place], [name], NP       Future Appointments             In 2 months [name], [name], NP [facility]   In 6 months  [facility]

## 2020-09-28 NOTE — Telephone Encounter (Signed)
Patient last seen 09/09/20 and has appointment 12/10/20

## 2020-09-29 ENCOUNTER — Encounter: Payer: Self-pay | Admitting: Gastroenterology

## 2020-09-29 ENCOUNTER — Other Ambulatory Visit: Payer: Self-pay

## 2020-09-29 ENCOUNTER — Encounter: Payer: Self-pay | Admitting: Student in an Organized Health Care Education/Training Program

## 2020-09-29 ENCOUNTER — Ambulatory Visit (INDEPENDENT_AMBULATORY_CARE_PROVIDER_SITE_OTHER): Payer: Medicare Other | Admitting: Gastroenterology

## 2020-09-29 ENCOUNTER — Ambulatory Visit: Payer: Medicare Other | Admitting: Gastroenterology

## 2020-09-29 ENCOUNTER — Telehealth: Payer: Self-pay

## 2020-09-29 VITALS — BP 130/73 | HR 72 | Temp 97.6°F | Ht 73.0 in | Wt 190.2 lb

## 2020-09-29 DIAGNOSIS — K909 Intestinal malabsorption, unspecified: Secondary | ICD-10-CM

## 2020-09-29 DIAGNOSIS — K529 Noninfective gastroenteritis and colitis, unspecified: Secondary | ICD-10-CM

## 2020-09-29 DIAGNOSIS — R195 Other fecal abnormalities: Secondary | ICD-10-CM | POA: Diagnosis not present

## 2020-09-29 DIAGNOSIS — R1310 Dysphagia, unspecified: Secondary | ICD-10-CM | POA: Diagnosis not present

## 2020-09-29 DIAGNOSIS — D649 Anemia, unspecified: Secondary | ICD-10-CM

## 2020-09-29 DIAGNOSIS — K2100 Gastro-esophageal reflux disease with esophagitis, without bleeding: Secondary | ICD-10-CM | POA: Diagnosis not present

## 2020-09-29 MED ORDER — PEG 3350-KCL-NA BICARB-NACL 420 G PO SOLR: ORAL | 0 refills | Status: DC

## 2020-09-29 MED ORDER — OMEPRAZOLE 40 MG PO CPDR: 40.0000 mg | DELAYED_RELEASE_CAPSULE | Freq: Every day | ORAL | 3 refills | Status: DC

## 2020-09-29 NOTE — Progress Notes (Signed)
Jonathon Bellows MD, MRCP(U.K) [address]  [place], [place] [address]  Main: [phone]  Fax: [phone]   Gastroenterology Consultation  Referring Provider:     [name], NP Primary Care Physician:  [name], NP Primary Gastroenterologist:  Dr. Jonathon Bellows  Reason for Consultation:     Anemia and stool occult is positive        HPI:   Sean Reyes. is a 80 y.o. y/o male referred for consultation & management  by [name], NP.     He has been referred for anemia and stool occult test positive.  He has previously been a patient of [facility] last seen at their office in July 2021 for diarrhea. Positive for Salmonella treated with antibiotics, esophagitis, GERD, dysphagia.  Last colonoscopy in 2017 showed diverticulosis multiple nonbleeding AVMs throughout the colon.  Last EGD was in February 2020 by Dr. Gustavo Lah for dysphagia found to have a hiatal hernia, LA grade B esophagitis.  Unfortunately no biopsies were taken to rule out eosinophilic esophagitis.  He is on metformin.  Stool occult test was positive on 09/14/2020, iron studies are normal with a ferritin of 66 and normal TIBC.  Hemoglobin 11.1 g with an MCV of 93.  His creatinine is 1.38.  He denies any overt blood loss.  He is on Eliquis.  He has been having diarrhea on and off long-term.  Denies any use of artificial sugars or sweeteners.  He is on metformin.  Multiple bowel movements a day watery in nature.  Dysphagia with pills getting stuck in his upper part of his throat.  Morphological's.  Prior modified barium swallow showed no gross abnormality.  Not on a PPI.Previously was on Protonix.  History of esophagitis.  Past Medical History:  Diagnosis Date   Anemia    Anxiety    Arthritis    Arthritis of neck    Atrial fibrillation (HCC)    Cataracts, bilateral    Complication of anesthesia    pt reports low BP's after surgery at [facility] and  difficulty awakening   Depression    Diabetes ([place])    dx 6-8 yrs ago   Dysrhythmia    a-fib   GERD (gastroesophageal reflux disease)    OCC TAKES ALKA SELTZER   History of kidney stones    10-15 yrs ago   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   Hyperlipidemia    Hypertension    Nocturia    S/P ablation of atrial fibrillation    Ablative therapy   Sleep apnea    CPAP    Spinal stenosis    Tachycardia, unspecified     Past Surgical History:  Procedure Laterality Date   ABLATION     ANTERIOR LAT LUMBAR FUSION N/A 06/27/2017   Procedure: Anterior Lateral Lumbar Interbody  Fusion - Lumbar Two-Lumbar Three - Lumbar Three-Lumbar Four, Posterior Lumbar Interbody Fusion Lumbar Four- Five;  Surgeon: Kary Kos, MD;  Location: [facility];  Service: Neurosurgery;  Laterality: N/A;  Anterior Lateral Lumbar Interbody  Fusion - Lumbar Two-Lumbar Three - Lumbar Three-Lumbar Four, Posterior Lumbar Interbody Fusion Lumbar Four- Five   BACK SURGERY     CARDIOVERSION N/A 08/29/2018   Procedure: CARDIOVERSION;  Surgeon: Corey Skains, MD;  Location: [facility];  Service: Cardiovascular;  Laterality: N/A;   CARDIOVERSION N/A 09/24/2018   Procedure: CARDIOVERSION;  Surgeon: Corey Skains, MD;  Location: [facility];  Service: Cardiovascular;  Laterality: N/A;   COLONOSCOPY WITH PROPOFOL N/A 10/05/2015   Procedure: COLONOSCOPY WITH PROPOFOL;  Surgeon: Lollie Sails, MD;  Location: [facility] ENDOSCOPY;  Service: Endoscopy;  Laterality: N/A;   ESOPHAGOGASTRODUODENOSCOPY (EGD) WITH PROPOFOL N/A 04/01/2018   Procedure: ESOPHAGOGASTRODUODENOSCOPY (EGD) WITH PROPOFOL;  Surgeon: Lollie Sails, MD;  Location: [facility] ENDOSCOPY;  Service: Endoscopy;  Laterality: N/A;   HERNIA REPAIR     JOINT REPLACEMENT Bilateral    hips  RT+  LEFT X2    LUMBAR LAMINECTOMY/DECOMPRESSION MICRODISCECTOMY Left 09/13/2016   Procedure: Microdiscectomy - Lumbar two-three,  Lumbar three- - left;  Surgeon: Kary Kos, MD;   Location: [facility];  Service: Neurosurgery;  Laterality: Left;   SPINAL CORD STIMULATOR INSERTION  07/08/2019   TONSILLECTOMY      Prior to Admission medications   Medication Sig Start Date End Date Taking? Authorizing Provider  acetaminophen (TYLENOL) 500 MG tablet Take 500 mg by mouth every 6 (six) hours as needed (pain).    [provider]  ELIQUIS 5 MG TABS tablet Take 1 tablet (5 mg total) by mouth 2 (two) times daily. 09/28/20   [name], Henrine Screws T, NP  ferrous sulfate 325 (65 FE) MG tablet Take by mouth. Patient not taking: Reported on 09/09/2020    [provider]  finasteride (PROSCAR) 5 MG tablet Take 1 tablet (5 mg total) by mouth daily. 08/20/20   [name], Henrine Screws T, NP  gabapentin (NEURONTIN) 300 MG capsule Take 1 capsule (300 mg total) by mouth 2 (two) times daily. 11/25/19   [name], Henrine Screws T, NP  metFORMIN (GLUCOPHAGE-XR) 500 MG 24 hr tablet Take 2 tablets (1,000 mg total) by mouth 2 (two) times daily. 10/13/19   [name], Henrine Screws T, NP  metoprolol succinate (TOPROL-XL) 25 MG 24 hr tablet Take 1 tablet by mouth daily. 03/31/19   [provider]  pantoprazole (PROTONIX) 40 MG tablet Take 40 mg by mouth at bedtime.     [provider]  propafenone (RYTHMOL SR) 425 MG 12 hr capsule Take 425 mg by mouth 2 (two) times daily.    [provider]  rosuvastatin (CRESTOR) 5 MG tablet Take 1 tablet (5 mg total) by mouth daily. 11/25/19   [name], Henrine Screws T, NP  tamsulosin (FLOMAX) 0.4 MG CAPS capsule Take 0.8 mg by mouth daily. 07/18/19   [provider]  trimethoprim-polymyxin b (POLYTRIM) ophthalmic solution Apply to eye. 07/14/20   [provider]    Family History  Problem Relation Age of Onset   Brain cancer Mother    Kidney disease Neg Hx    Prostate cancer Neg Hx    Kidney cancer Neg Hx    Bladder Cancer Neg Hx      Social History   Tobacco Use   Smoking status: Never   Smokeless tobacco: Never  Vaping Use   Vaping Use:  Never used  Substance Use Topics   Alcohol use: No   Drug use: No    Allergies as of 09/29/2020 - Review Complete 09/10/2020  Allergen Reaction Noted   Levaquin [levofloxacin in d5w] Anaphylaxis and Shortness Of Breath 07/27/2014   Shellfish allergy Anaphylaxis 08/26/2014   Amiodarone Other (See Comments) 04/19/2015   Adhesive [tape] Other (See Comments) 04/19/2018    Review of Systems:    All systems reviewed and negative except where noted in HPI.   Physical Exam:  There were no vitals taken for this visit. No LMP for male patient. Psych:  Alert and cooperative. Normal mood  and affect. General:   Alert,  Well-developed, well-nourished, pleasant and cooperative in NAD Head:  Normocephalic and atraumatic. Eyes:  Sclera clear, no icterus.   Conjunctiva pink. Ears:  Normal auditory acuity. Lungs:  Respirations even and unlabored.  Clear throughout to auscultation.   No wheezes, crackles, or rhonchi. No acute distress. Heart:  Regular rate and rhythm; no murmurs, clicks, rubs, or gallops. Abdomen:  Normal bowel sounds.  No bruits.  Soft, non-tender and non-distended without masses, hepatosplenomegaly or hernias noted.  No guarding or rebound tenderness.    Neurologic:  Alert and oriented x3;  grossly normal neurologically. Psych:  Alert and cooperative. Normal mood and affect.  Imaging Studies: No results found.  Assessment and Plan:   Macey Wearing. is a 80 y.o. y/o male has been referred for a normocytic anemia with normal iron studies and stool occult is positive.  The pattern of his CBC and iron studies does not indicate iron deficiency and hence would not benefit from GI evaluation for this condition.  He has an elevated creatinine since past 7 months and may contribute to his anemia of chronic disease but his iron studies are not clear-cut indicating the same.  In terms of stool occult is positive it could be positive from his AVMs but since his colonoscopy was over 5 years  back further evaluation would be warranted.  He has dysphagia for solids particularly for pills and eosinophilic esophagitis has not been ruled out in the past.  Plan 1.  Check B12 folate and if normal would suggest recommending evaluation with hematology as sometimes a low-grade MDS may cause of anemia or could be anemia of chronic disease. 2.  Colonoscopy to evaluate for rectal bleeding likely from AVMs of the colon which can be ablated also will rule out microscopic colitis at the same time.  At the same time I will perform EGD and rule out eosinophilic esophagitis. 3.  Diarrhea check stool studies and celiac serology and fecal calprotectin 4.  If diarrhea work-up as above is negative and he continues to have it may need to have a trial of withholding metformin as he is at a high dose which can at times precipitate diarrhea even after many years of using it.  I have discussed alternative options, risks & benefits,  which include, but are not limited to, bleeding, infection, perforation,respiratory complication & drug reaction.  The patient agrees with this plan & written consent will be obtained.    Follow up in 6 to 10 weeks  Dr Jonathon Bellows MD,MRCP(U.K)

## 2020-09-29 NOTE — Telephone Encounter (Signed)
Blood thinner request was faxed to Dr. Alveria Apley office. Awaiting on response.

## 2020-09-29 NOTE — Addendum Note (Signed)
Addended by: Wayna Chalet on: 09/29/2020 08:38 AM   Modules accepted: Orders

## 2020-09-30 ENCOUNTER — Ambulatory Visit
Payer: Medicare Other | Attending: Student in an Organized Health Care Education/Training Program | Admitting: Student in an Organized Health Care Education/Training Program

## 2020-09-30 DIAGNOSIS — G8929 Other chronic pain: Secondary | ICD-10-CM | POA: Diagnosis not present

## 2020-09-30 DIAGNOSIS — M48062 Spinal stenosis, lumbar region with neurogenic claudication: Secondary | ICD-10-CM

## 2020-09-30 DIAGNOSIS — G894 Chronic pain syndrome: Secondary | ICD-10-CM | POA: Diagnosis not present

## 2020-09-30 DIAGNOSIS — Z9689 Presence of other specified functional implants: Secondary | ICD-10-CM | POA: Diagnosis not present

## 2020-09-30 DIAGNOSIS — M961 Postlaminectomy syndrome, not elsewhere classified: Secondary | ICD-10-CM

## 2020-09-30 DIAGNOSIS — M47812 Spondylosis without myelopathy or radiculopathy, cervical region: Secondary | ICD-10-CM | POA: Diagnosis not present

## 2020-09-30 DIAGNOSIS — M5416 Radiculopathy, lumbar region: Secondary | ICD-10-CM

## 2020-09-30 DIAGNOSIS — M542 Cervicalgia: Secondary | ICD-10-CM | POA: Diagnosis not present

## 2020-09-30 DIAGNOSIS — Z981 Arthrodesis status: Secondary | ICD-10-CM | POA: Diagnosis not present

## 2020-09-30 MED ORDER — PREDNISONE 20 MG PO TABS: ORAL_TABLET | ORAL | 0 refills | Status: AC

## 2020-09-30 NOTE — Progress Notes (Signed)
Patient: Sean Reyes.  Service Category: E/M  Provider: Gillis Santa, MD  DOB: January 03, 1941  DOS: 09/30/2020  Location: Office  MRN: [id number]  Setting: Ambulatory outpatient  Referring Provider: [name], NP  Type: Established Patient  Specialty: Interventional Pain Management  PCP: [name], NP  Location: Home  Delivery: TeleHealth     Virtual Encounter - Pain Management PROVIDER NOTE: Information contained herein reflects review and annotations entered in association with encounter. Interpretation of such information and data should be left to medically-trained personnel. Information provided to patient can be located elsewhere in the medical record under "Patient Instructions". Document created using STT-dictation technology, any transcriptional errors that may result from process are unintentional.    Contact & Pharmacy Preferred: [phone] Home: [phone] (home) Mobile: [phone] (mobile) E-mail: [email or web address]  [facility] - [place], [place] - [address] [place] [address] Phone: [phone] Fax: [place], [place] - [address] [place] [address] Phone: [phone] Fax: [phone]   Pre-screening  Sean Reyes offered "in-person" vs "virtual" encounter. He indicated preferring virtual for this encounter.   Reason COVID-19*  Social distancing based on CDC and AMA recommendations.   I contacted Sean Reyes. on 09/30/2020 via video conference.      I clearly identified myself as Gillis Santa, MD. I verified that I was speaking with the correct person using two identifiers (Name: Sean Reyes., and date of birth: 10/10/1940).  Consent I sought verbal advanced consent from Sean Reyes. for virtual visit interactions. I informed Sean Reyes of possible security and privacy concerns, risks, and limitations associated with providing "not-in-person" medical  evaluation and management services. I also informed Sean Reyes of the availability of "in-person" appointments. Finally, I informed him that there would be a charge for the virtual visit and that he could be  personally, fully or partially, financially responsible for it. Sean Reyes expressed understanding and agreed to proceed.   Historic Elements   [name]. is a 80 y.o. year old, male patient evaluated today after our last contact on 08/02/2020. Sean Reyes  has a past medical history of Anemia, Anxiety, Arthritis, Arthritis of neck, Atrial fibrillation (Sean Reyes), Cataracts, bilateral, Complication of anesthesia, Depression, Diabetes (Sean Reyes), Dysrhythmia, GERD (gastroesophageal reflux disease), History of kidney stones, HOH (hard of hearing), Hyperlipidemia, Hypertension, Nocturia, S/P ablation of atrial fibrillation, Sleep apnea, Spinal stenosis, and Tachycardia, unspecified. He also  has a past surgical history that includes Tonsillectomy; Hernia repair; Ablation; Colonoscopy with propofol (N/A, 10/05/2015); Lumbar laminectomy/decompression microdiscectomy (Left, 09/13/2016); Anterior lat lumbar fusion (N/A, 06/27/2017); Joint replacement (Bilateral); Back surgery; Esophagogastroduodenoscopy (egd) with propofol (N/A, 04/01/2018); Cardioversion (N/A, 08/29/2018); Cardioversion (N/A, 09/24/2018); and Spinal cord stimulator insertion (07/08/2019). Sean Reyes has a current medication list which includes the following prescription(s): acetaminophen, eliquis, ferrous sulfate, finasteride, gabapentin, metformin, metoprolol succinate, omeprazole, polyethylene glycol-electrolytes, prednisone, propafenone, rosuvastatin, tamsulosin, and trimethoprim-polymyxin b. He  reports that he has never smoked. He has never used smokeless tobacco. He reports that he does not drink alcohol and does not use drugs. Sean Reyes is allergic to levaquin [levofloxacin in d5w], shellfish allergy, amiodarone, and adhesive [tape].   HPI  Today, he  is being contacted for worsening of previously known (established) problem  Patient is having significantly more lower back pain.  He is having trouble ambulating and doing basic ADLs.  He has a Nevro spinal cord stimulator in  place.  He states that he has reached out to optimize programming but that still was not effective.  Patient has significant spinal pathology with extensive L2-S1 fusion status post thoracolumbar spinal cord stimulator.  At this point, I have limited options for him.  We did discuss a prednisone taper.  He is a diabetic so we discussed the risks of steroid therapy as it pertains to blood pressure, blood sugars, kidney risk.  At this point, we both feel that the benefit outweighs the risk.  Of instructed him to monitor his blood pressure and heart rate as well as his blood sugars while he is on a prednisone taper.  Patient endorsed understanding.  He states that he would also reach out to [facility] as she recommended other things that could potentially be done.   Laboratory Chemistry Profile   Renal Lab Results  Component Value Date   BUN 19 09/09/2020   CREATININE 1.38 (H) 09/09/2020   BCR 14 09/09/2020   GFRAA 64 02/26/2020   GFRNONAA 55 (L) 02/26/2020    Hepatic Lab Results  Component Value Date   AST 16 06/10/2020   ALT 19 06/10/2020   ALBUMIN 4.1 06/10/2020   ALKPHOS 69 06/10/2020   LIPASE 25 12/19/2018    Electrolytes Lab Results  Component Value Date   NA 137 09/09/2020   K 4.1 09/09/2020   CL 98 09/09/2020   CALCIUM 9.3 09/09/2020   MG 2.1 07/23/2018    Bone Lab Results  Component Value Date   VD25OH 41.7 02/26/2020   TESTOFREE 6.0 (L) 09/01/2015   TESTOSTERONE 642 12/11/2016    Inflammation (CRP: Acute Phase) (ESR: Chronic Phase) No results found for: CRP, ESRSEDRATE, LATICACIDVEN       Note: Above Lab results reviewed.  Imaging  MR CERVICAL SPINE WO CONTRAST CLINICAL DATA:  Worsening neck pain for 3 months.  No known  injury.  EXAM: MRI CERVICAL SPINE WITHOUT CONTRAST  TECHNIQUE: Multiplanar, multisequence MR imaging of the cervical spine was performed. No intravenous contrast was administered.  COMPARISON:  X-ray 04/16/2020  FINDINGS: Alignment: 3 mm grade 1 anterolisthesis C4 on C5.  Vertebrae: No fracture, evidence of discitis, or bone lesion. Reactive marrow signal changes centered at the right C4-5 facet joint with mild adjacent soft tissue edema.  Cord: Normal signal and morphology.  Posterior Fossa, vertebral arteries, paraspinal tissues: Negative.  Disc levels:  C2-C3: Mild posterior disc osteophyte complex with prominent left uncovertebral spurring. Mild left worse than right facet arthropathy. Findings contribute to moderate left foraminal stenosis. No canal stenosis.  C3-C4: Minimal disc osteophyte complex with left worse than right uncovertebral arthropathy and bilateral facet arthropathy. Findings contribute to moderate left and mild right foraminal stenosis. No canal stenosis.  C4-C5: Disc uncovering with mild disc bulge, eccentric to the left. Advanced right and mild left facet arthropathy. Mild bilateral foraminal stenosis. No canal stenosis.  C5-C6: Disc osteophyte complex with bilateral facet and uncovertebral arthropathy. Findings result in mild canal stenosis with moderate bilateral foraminal stenosis.  C6-C7: Disc osteophyte complex with right greater than left uncovertebral spurring and mild bilateral facet arthropathy. Findings result in mild-to-moderate bilateral foraminal stenosis without significant canal stenosis.  C7-T1: No disc protrusion. Mild bilateral facet and uncovertebral spurring. No foraminal or canal stenosis.  IMPRESSION: 1. Multilevel cervical spondylosis, most pronounced at the C5-6 level where there is mild canal stenosis and moderate bilateral foraminal stenosis. 2. Additional levels of bilateral foraminal stenosis, moderate on the  left at C2-3 and C3-4. 3.  Advanced arthropathy of the right C4-5 facet joint with associated reactive marrow signal changes and mild adjacent soft tissue edema. This may be a focal source of pain.  Electronically Signed   By: Davina Poke D.O.   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 Diagnoses of Cervicalgia, Spinal cord stimulator status, History of fusion of lumbar spine (L2-L5), Failed back surgical syndrome, Spinal stenosis, lumbar region, with neurogenic claudication, Chronic radicular lumbar pain, Postlaminectomy syndrome, lumbar region, and Chronic pain syndrome were also pertinent to this visit.  Plan of Care    Sean Reyes. has a current medication list which includes the following long-term medication(s): eliquis, ferrous sulfate, gabapentin, metformin, omeprazole, propafenone, and rosuvastatin.  Pharmacotherapy (Medications Ordered): Meds ordered this encounter  Medications   predniSONE (DELTASONE) 20 MG tablet    Sig: Take 3 tablets (60 mg total) by mouth daily with breakfast for 3 days, THEN 2 tablets (40 mg total) daily with breakfast for 3 days, THEN 1 tablet (20 mg total) daily with breakfast for 3 days.    Dispense:  18 tablet    Refill:  0    Follow-up plan:   Return if symptoms worsen or fail to improve.     Status post caudal 02/03/2019: 6 cc injected- not helpful, status post Nevro spinal cord stimulator implant with Dr. Mearl Latin at [facility] Visits Date Type Provider Dept  07/06/20 Office Visit Gillis Santa, MD [facility]  Showing recent visits within past 90 days and meeting all other requirements Today's Visits Date Type Provider Dept  09/30/20 Telemedicine Gillis Santa, MD [facility]  Showing today's visits and meeting all other requirements Future Appointments No visits were found meeting these conditions. Showing future appointments within next 90  days and meeting all other requirements I discussed the assessment and treatment plan with the patient. The patient was provided an opportunity to ask questions and all were answered. The patient agreed with the plan and demonstrated an understanding of the instructions.  Patient advised to call back or seek an in-person evaluation if the symptoms or condition worsens.  Duration of encounter: 66mnutes.  Note by: BGillis Santa MD Date: 09/30/2020; Time: 2:56 PM

## 2020-09-30 NOTE — Telephone Encounter (Signed)
Patient will be contacted to let him know that he is to hold his Eliquis 3 days prior to his procedure and restart it 3 days after. Patient understood.

## 2020-10-01 LAB — B12 AND FOLATE PANEL
Folate: 11.8 ng/mL (ref >3.0)
Vitamin B-12: 454 pg/mL (ref 232–1245)

## 2020-10-01 LAB — CELIAC DISEASE AB SCREEN W/RFX
Antigliadin Abs, IgA: 2 units (ref 0–19)
IgA/Immunoglobulin A, Serum: 268 mg/dL (ref 61–437)
Transglutaminase IgA: <2 U/mL (ref 0–3)

## 2020-10-04 DIAGNOSIS — K529 Noninfective gastroenteritis and colitis, unspecified: Secondary | ICD-10-CM | POA: Diagnosis not present

## 2020-10-04 DIAGNOSIS — D649 Anemia, unspecified: Secondary | ICD-10-CM | POA: Diagnosis not present

## 2020-10-04 DIAGNOSIS — K909 Intestinal malabsorption, unspecified: Secondary | ICD-10-CM | POA: Diagnosis not present

## 2020-10-06 LAB — GI PROFILE, STOOL, PCR

## 2020-10-06 LAB — CALPROTECTIN, FECAL: Calprotectin, Fecal: 224 ug/g — ABNORMAL HIGH (ref 0–120)

## 2020-10-08 LAB — C DIFFICILE, CYTOTOXIN B

## 2020-10-08 LAB — C DIFFICILE TOXINS A+B W/RFLX: C difficile Toxins A+B, EIA: NEGATIVE

## 2020-10-12 ENCOUNTER — Encounter: Payer: Self-pay | Admitting: *Deleted

## 2020-10-12 ENCOUNTER — Telehealth: Payer: Self-pay | Admitting: Gastroenterology

## 2020-10-12 MED ORDER — VANCOMYCIN HCL 125 MG PO CAPS: 125.0000 mg | ORAL_CAPSULE | Freq: Four times a day (QID) | ORAL | 0 refills | Status: AC

## 2020-10-12 NOTE — Telephone Encounter (Signed)
Called patient with Dr. Georgeann Oppenheim recommendations and he stated that he agreed on changing his procedure date to October 27, 2020. I then called Trish and informed her of the change.

## 2020-10-12 NOTE — Telephone Encounter (Signed)
Patient called back and I informed him about his prescription being sent and to start taking it right away. Patient was not so happy because he stated that we had the results since last Wednesday and we just called him yesterday. I apologized and then he was okay but wanted to know if we will need to reschedule his procedure. Patient also wanted the physician to know that his wife and daughter have been having diarrhea and wanted to know if they will be needing antibiotics too. Please advise.

## 2020-10-12 NOTE — Telephone Encounter (Signed)
Dr. Vicente Males, sine this patient has CDiff and will start taking antibiotics for the next 10 days, do I need to reschedule his colonoscopy? He is scheduled to be on 10/19/2020.

## 2020-10-12 NOTE — Telephone Encounter (Signed)
Called patient and left a voicemail letting him know that he is C diff positive, therefore, he is to start taking antibiotics that were sent to his pharmacy for 10 days.

## 2020-10-12 NOTE — Telephone Encounter (Signed)
LVM for pathology results and still having bad diarrhea.

## 2020-10-25 ENCOUNTER — Ambulatory Visit (INDEPENDENT_AMBULATORY_CARE_PROVIDER_SITE_OTHER): Payer: Medicare Other | Admitting: Nurse Practitioner

## 2020-10-25 ENCOUNTER — Encounter: Payer: Self-pay | Admitting: Nurse Practitioner

## 2020-10-25 ENCOUNTER — Other Ambulatory Visit: Payer: Self-pay

## 2020-10-25 VITALS — BP 130/76 | HR 64 | Temp 98.7°F | Ht 72.99 in | Wt 191.2 lb

## 2020-10-25 DIAGNOSIS — M79661 Pain in right lower leg: Secondary | ICD-10-CM | POA: Diagnosis not present

## 2020-10-25 DIAGNOSIS — Z23 Encounter for immunization: Secondary | ICD-10-CM | POA: Diagnosis not present

## 2020-10-25 NOTE — Progress Notes (Signed)
Established Patient Office Visit  Subjective:  Patient ID: Sean Reyes., male    DOB: [birth year]  Age: 80 y.o. MRN: [id number]  CC:  Chief Complaint  Patient presents with   spot on leg    Spot on right lower leg, patient states has been there for 30 years, has recently started to hurt him at night      HPI Tyreece Reyes. presents for follow-up on pain to right shin. He states that 30+ years ago he ran into a trailer hitch and hurt his leg. Since then it has healed, however he is having severe pain to the site with minimal touching. He has to be careful putting pants on because if it rubs, it will cause him severe pain. Pain can be 9/10 at times and is described as stabbing. He has been to neurology who he states that nerves might not have healed properly and is causing continued pain. His daughter works at [employer] and suggested he see a provider there for evaluation and treatment.   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   Hyperlipidemia    Hypertension    Nocturia    S/P ablation of atrial fibrillation    Ablative therapy   Sleep apnea    CPAP    Spinal stenosis    Tachycardia, unspecified     Past Surgical History:  Procedure Laterality Date   ABLATION     ANTERIOR LAT LUMBAR FUSION N/A 06/27/2017   Procedure: Anterior Lateral Lumbar Interbody  Fusion - Lumbar Two-Lumbar Three - Lumbar Three-Lumbar Four, Posterior Lumbar Interbody Fusion Lumbar Four- Five;  Surgeon: Kary Kos, MD;  Location: [place];   Service: Neurosurgery;  Laterality: N/A;  Anterior Lateral Lumbar Interbody  Fusion - Lumbar Two-Lumbar Three - Lumbar Three-Lumbar Four, Posterior Lumbar Interbody Fusion Lumbar Four- Five   BACK SURGERY     CARDIOVERSION N/A 08/29/2018   Procedure: CARDIOVERSION;  Surgeon: Corey Skains, MD;  Location: [facility] ORS;  Service: Cardiovascular;  Laterality: N/A;   CARDIOVERSION N/A 09/24/2018   Procedure: CARDIOVERSION;  Surgeon: Corey Skains, MD;  Location: [facility] OR[facility];  Service: Cardiovascular;  Laterality: N/A;   COLONOSCOPY WITH PROPOFOL N/A 10/05/2015   Procedure: COLONOSCOPY WITH PROPOFOL;  Surgeon: Lollie Sails, MD;  Location: [facility] ENDOSCOPY;  Service: Endoscopy;  Laterality: N/A;   ESOPHAGOGASTRODUODENOSCOPY (EGD) WITH PROPOFOL N/A 04/01/2018   Procedure: ESOPHAGOGASTRODUODENOSCOPY (EGD) WITH PROPOFOL;  Surgeon: Lollie Sails, MD;  Location: [facility] ENDOSCOPY;  Service: Endoscopy;  Laterality: N/A;   HERNIA REPAIR     JOINT REPLACEMENT Bilateral    hips  RT+  LEFT X2    LUMBAR LAMINECTOMY/DECOMPRESSION MICRODISCECTOMY Left 09/13/2016   Procedure: Microdiscectomy - Lumbar two-three,  Lumbar three- - left;  Surgeon: Kary Kos, MD;  Location: [place];  Service: Neurosurgery;  Laterality: Left;   SPINAL CORD STIMULATOR INSERTION  07/08/2019   TONSILLECTOMY      Family History  Problem Relation Age of Onset   Brain cancer Mother    Kidney disease Neg Hx    Prostate cancer Neg Hx    Kidney cancer Neg Hx    Bladder Cancer Neg Hx     Social History   Socioeconomic History   Marital status: Married    Spouse name: Malachy Mood    Number of children: 2   Years of education: Not on file   Highest education level: High school graduate  Occupational History   Occupation: retired   Tobacco Use   Smoking status: Never   Smokeless tobacco: Never  Vaping Use   Vaping Use: Never used  Substance and Sexual Activity   Alcohol use: No   Drug use: No   Sexual activity: Not on file  Other  Topics Concern   Not on file  Social History Narrative   Married   Gets regular exercise   Social Determinants of Health   Financial Resource Strain: Low Risk    Difficulty of Paying Living Expenses: Not hard at all  Food Insecurity: No Food Insecurity   Worried About Charity fundraiser in the Last Year: Never true   Arboriculturist in the Last Year: Never true  Transportation Needs: No Transportation Needs   Lack of Transportation (Medical): No   Lack of Transportation (Non-Medical): No  Physical Activity: Inactive   Days of Exercise per Week: 0 days   Minutes of Exercise per Session: 0 min  Stress: No Stress Concern Present   Feeling of Stress : Not at all  Social Connections: Not on file  Intimate Partner Violence: Not on file    Outpatient Medications Prior to Visit  Medication Sig Dispense Refill   acetaminophen (TYLENOL) 500 MG tablet Take 500 mg by mouth every 6 (six) hours as needed (pain).     ELIQUIS 5 MG TABS tablet Take 1 tablet (5 mg total) by mouth 2 (two) times daily. 180 tablet 4   ferrous sulfate 325 (65 FE) MG tablet Take by mouth.     finasteride (PROSCAR) 5 MG tablet Take 1 tablet (5 mg total) by mouth daily. 90 tablet 0   gabapentin (NEURONTIN) 300 MG capsule Take 1 capsule (300 mg total) by mouth 2 (two) times daily. 180 capsule 4   metFORMIN (GLUCOPHAGE-XR) 500 MG 24 hr tablet Take 2 tablets (1,000 mg total) by mouth 2 (two) times daily. 360 tablet 4   metoprolol succinate (TOPROL-XL) 25 MG 24 hr tablet Take 1 tablet by mouth daily.     omeprazole (PRILOSEC) 40 MG capsule Take 1 capsule (40 mg total) by mouth daily. 90 capsule 3   pantoprazole (PROTONIX) 40 MG tablet Take 40 mg by mouth daily.     polyethylene glycol-electrolytes (NULYTELY) 420 g solution Prepare according to package instructions. Starting at 5:00 PM: Drink one 8 oz glass of mixture every 15 minutes until you finish half of the jug. Five hours prior to procedure, drink 8 oz glass of mixture  every 15 minutes until it is all gone. Make sure you do not drink anything 4 hours prior to your procedure. 4000 mL 0   propafenone (RYTHMOL SR) 425 MG 12 hr capsule Take 425 mg by mouth 2 (two) times daily.     rosuvastatin (CRESTOR) 5 MG tablet Take 1 tablet (5 mg total) by mouth daily. 90 tablet 3   tamsulosin (FLOMAX) 0.4 MG CAPS capsule  Take 0.8 mg by mouth daily.     trimethoprim-polymyxin b (POLYTRIM) ophthalmic solution Apply to eye.     vancomycin (VANCOCIN) 125 MG capsule Take 1 capsule (125 mg total) by mouth 4 (four) times daily for 14 days. 56 capsule 0   No facility-administered medications prior to visit.    Allergies  Allergen Reactions   Levaquin [Levofloxacin In D5w] Anaphylaxis and Shortness Of Breath   Shellfish Allergy Anaphylaxis    Has used duraprep, betadine and ioban in previous surgeries in 2019 and 2018 without issue   Amiodarone Other (See Comments)    Tremors and thyroid toxicity   Adhesive [Tape] Other (See Comments)    Little red bumps under the dressing.  He questions whether is latex related    ROS Review of Systems  Constitutional:  Positive for fatigue.  Respiratory: Negative.    Cardiovascular: Negative.   Gastrointestinal: Negative.   Musculoskeletal:        Right shin pain  Skin: Negative.      Objective:    Physical Exam Vitals and nursing note reviewed.  Constitutional:      General: He is not in acute distress. Eyes:     Conjunctiva/sclera: Conjunctivae normal.  Cardiovascular:     Rate and Rhythm: Normal rate.     Pulses: Normal pulses.  Pulmonary:     Effort: Pulmonary effort is normal.  Musculoskeletal:     Cervical back: Normal range of motion.  Skin:    Comments: Scar noted to right shin. Patient did not want me to touch this area. No redness or edema noted  Neurological:     Mental Status: He is alert and oriented to person, place, and time.  Psychiatric:        Mood and Affect: Mood normal.        Behavior: Behavior  normal.        Thought Content: Thought content normal.        Judgment: Judgment normal.    BP 130/76   Pulse 64   Temp 98.7 F (37.1 C) (Oral)   Ht 6' 0.99" (1.854 m)   Wt 191 lb 3.2 oz (86.7 kg)   SpO2 97%   BMI 25.23 kg/m  Wt Readings from Last 3 Encounters:  10/25/20 191 lb 3.2 oz (86.7 kg)  09/29/20 190 lb 3.2 oz (86.3 kg)  09/10/20 187 lb (84.8 kg)     Health Maintenance Due  Topic Date Due   COVID-19 Vaccine (4 - Booster for Pfizer series) 03/09/2020   OPHTHALMOLOGY EXAM  05/13/2020    There are no preventive care reminders to display for this patient.  Lab Results  Component Value Date   TSH 3.580 02/26/2020   Lab Results  Component Value Date   WBC 5.5 09/09/2020   HGB 11.1 (L) 09/09/2020   HCT 33.8 (L) 09/09/2020   MCV 93 09/09/2020   PLT 158 09/09/2020   Lab Results  Component Value Date   NA 137 09/09/2020   K 4.1 09/09/2020   CO2 22 09/09/2020   GLUCOSE 187 (H) 09/09/2020   BUN 19 09/09/2020   CREATININE 1.38 (H) 09/09/2020   BILITOT 0.4 06/10/2020   ALKPHOS 69 06/10/2020   AST 16 06/10/2020   ALT 19 06/10/2020   PROT 6.7 06/10/2020   ALBUMIN 4.1 06/10/2020   CALCIUM 9.3 09/09/2020   ANIONGAP 13 06/25/2019   EGFR 52 (L) 09/09/2020   Lab Results  Component Value Date   CHOL 105 09/09/2020  Lab Results  Component Value Date   HDL 41 09/09/2020   Lab Results  Component Value Date   LDLCALC 43 09/09/2020   Lab Results  Component Value Date   TRIG 113 09/09/2020   Lab Results  Component Value Date   CHOLHDL 3.6 12/20/2017   Lab Results  Component Value Date   HGBA1C 6.6 09/09/2020      Assessment & Plan:   Problem List Items Addressed This Visit       Other   Pain in right shin    Chronic, ongoing. Has tried gabapentin, and OTC pain medication. He has seen neurology in the past. Vascular consult placed per patient request. Will await their evaluation and recommendation. Keep follow-up appointment with PCP in  October.       Relevant Orders   Ambulatory referral to Vascular Surgery   Other Visit Diagnoses     Need for influenza vaccination    -  Primary   flu vaccine given today   Relevant Orders   Flu Vaccine QUAD High Dose(Fluad) (Completed)       No orders of the defined types were placed in this encounter.   Follow-up: Return if symptoms worsen or fail to improve.    Charyl Dancer, NP

## 2020-10-25 NOTE — Assessment & Plan Note (Signed)
Chronic, ongoing. Has tried gabapentin, and OTC pain medication. He has seen neurology in the past. Vascular consult placed per patient request. Will await their evaluation and recommendation. Keep follow-up appointment with PCP in October.

## 2020-10-29 ENCOUNTER — Encounter: Payer: Self-pay | Admitting: Gastroenterology

## 2020-11-01 ENCOUNTER — Ambulatory Visit: Payer: Medicare Other | Admitting: Anesthesiology

## 2020-11-01 ENCOUNTER — Ambulatory Visit
Admission: RE | Admit: 2020-11-01 | Discharge: 2020-11-01 | Disposition: A | Discharge status: Home / Self Care | Payer: Medicare Other | Attending: Gastroenterology | Admitting: Gastroenterology

## 2020-11-01 ENCOUNTER — Encounter: Payer: Self-pay | Admitting: Gastroenterology

## 2020-11-01 ENCOUNTER — Encounter
Admission: RE | Disposition: A | Discharge status: Home / Self Care | Payer: Self-pay | Source: Home / Self Care | Attending: Gastroenterology

## 2020-11-01 DIAGNOSIS — Z881 Allergy status to other antibiotic agents status: Secondary | ICD-10-CM | POA: Insufficient documentation

## 2020-11-01 DIAGNOSIS — K649 Unspecified hemorrhoids: Secondary | ICD-10-CM | POA: Diagnosis not present

## 2020-11-01 DIAGNOSIS — K625 Hemorrhage of anus and rectum: Secondary | ICD-10-CM

## 2020-11-01 DIAGNOSIS — Z79899 Other long term (current) drug therapy: Secondary | ICD-10-CM | POA: Diagnosis not present

## 2020-11-01 DIAGNOSIS — K5730 Diverticulosis of large intestine without perforation or abscess without bleeding: Secondary | ICD-10-CM | POA: Insufficient documentation

## 2020-11-01 DIAGNOSIS — K640 First degree hemorrhoids: Secondary | ICD-10-CM | POA: Insufficient documentation

## 2020-11-01 DIAGNOSIS — R195 Other fecal abnormalities: Secondary | ICD-10-CM

## 2020-11-01 DIAGNOSIS — K222 Esophageal obstruction: Secondary | ICD-10-CM

## 2020-11-01 DIAGNOSIS — Z888 Allergy status to other drugs, medicaments and biological substances status: Secondary | ICD-10-CM | POA: Insufficient documentation

## 2020-11-01 DIAGNOSIS — D649 Anemia, unspecified: Secondary | ICD-10-CM | POA: Insufficient documentation

## 2020-11-01 DIAGNOSIS — G4733 Obstructive sleep apnea (adult) (pediatric): Secondary | ICD-10-CM | POA: Diagnosis not present

## 2020-11-01 DIAGNOSIS — K449 Diaphragmatic hernia without obstruction or gangrene: Secondary | ICD-10-CM | POA: Diagnosis not present

## 2020-11-01 DIAGNOSIS — Z7984 Long term (current) use of oral hypoglycemic drugs: Secondary | ICD-10-CM | POA: Insufficient documentation

## 2020-11-01 DIAGNOSIS — R1310 Dysphagia, unspecified: Secondary | ICD-10-CM | POA: Insufficient documentation

## 2020-11-01 DIAGNOSIS — Q399 Congenital malformation of esophagus, unspecified: Secondary | ICD-10-CM | POA: Insufficient documentation

## 2020-11-01 DIAGNOSIS — Z7901 Long term (current) use of anticoagulants: Secondary | ICD-10-CM | POA: Diagnosis not present

## 2020-11-01 HISTORY — PX: ESOPHAGOGASTRODUODENOSCOPY: SHX5428

## 2020-11-01 HISTORY — PX: COLONOSCOPY WITH PROPOFOL: SHX5780

## 2020-11-01 LAB — GLUCOSE, CAPILLARY: Glucose-Capillary: 139 mg/dL — ABNORMAL HIGH (ref 70–99)

## 2020-11-01 SURGERY — COLONOSCOPY WITH PROPOFOL
Anesthesia: General

## 2020-11-01 MED ORDER — SODIUM CHLORIDE 0.9 % IV SOLN
INTRAVENOUS | Status: DC
  Administered 2020-11-01: 20 mL/h via INTRAVENOUS

## 2020-11-01 MED ORDER — PROPOFOL 500 MG/50ML IV EMUL
INTRAVENOUS | Status: DC | PRN
  Administered 2020-11-01: 150 ug/kg/min via INTRAVENOUS

## 2020-11-01 MED ORDER — LIDOCAINE HCL (CARDIAC) PF 100 MG/5ML IV SOSY
PREFILLED_SYRINGE | INTRAVENOUS | Status: DC | PRN
  Administered 2020-11-01: 50 mg via INTRAVENOUS

## 2020-11-01 MED ORDER — LIDOCAINE HCL (PF) 2 % IJ SOLN
INTRAMUSCULAR | Status: AC
  Filled 2020-11-01: qty 5

## 2020-11-01 MED ORDER — PROPOFOL 500 MG/50ML IV EMUL
INTRAVENOUS | Status: AC
  Filled 2020-11-01: qty 50

## 2020-11-01 NOTE — Op Note (Signed)
[facility] Gastroenterology Patient Name: Sean Reyes Procedure Date: 11/01/2020 7:39 AM MRN: [id number] Account #: [id number] Date of Birth: [birth year] Admit Type: Outpatient Age: 80 Room: [facility] ENDO ROOM 4 Gender: Male Note Status: Finalized Instrument Name: Upper Endoscope [id number] Procedure:             Upper GI endoscopy Indications:           Dysphagia Providers:             Jonathon Bellows MD, MD Referring MD:          [name]. Ned Card (Referring MD) Medicines:             Monitored Anesthesia Care Complications:         No immediate complications. Procedure:             Pre-Anesthesia Assessment:                        - Prior to the procedure, a History and Physical was                         performed, and patient medications, allergies and                         sensitivities were reviewed. The patient's tolerance                         of previous anesthesia was reviewed.                        - The risks and benefits of the procedure and the                         sedation options and risks were discussed with the                         patient. All questions were answered and informed                         consent was obtained.                        - ASA Grade Assessment: III - A patient with severe                         systemic disease.                        After obtaining informed consent, the endoscope was                         passed under direct vision. Throughout the procedure,                         the patient's blood pressure, pulse, and oxygen                         saturations were monitored continuously. The Endoscope                         was introduced through  the mouth, and advanced to the                         third part of duodenum. The upper GI endoscopy was                         accomplished with ease. The patient tolerated the                         procedure well. Findings:      A non-obstructing and mild  Schatzki ring was found at the       gastroesophageal junction. A TTS dilator was passed through the scope.       Dilation with a 15-16.5-18 mm balloon dilator was performed to 18 mm.       The dilation site was examined and showed no change.      Normal mucosa was found in the entire esophagus. Biopsies were taken       with a cold forceps for histology.      The middle third of the esophagus and lower third of the esophagus were       moderately tortuous.      A medium-sized hiatal hernia was present.      The cardia and gastric fundus were normal on retroflexion.      The examined duodenum was normal. Impression:            - Non-obstructing and mild Schatzki ring. Dilated.                        - Normal mucosa was found in the entire esophagus.                         Biopsied.                        - Tortuous esophagus.                        - Medium-sized hiatal hernia.                        - Normal examined duodenum. Recommendation:        - Await pathology results.                        - Perform ambulatory esophageal manometry if symptoms                         persist.                        - Perform a colonoscopy today. Procedure Code(s):     --- Professional ---                        [phone], Esophagogastroduodenoscopy, flexible,                         transoral; with transendoscopic balloon dilation of                         esophagus (less than 30 mm diameter)  [id number], 8, Esophagogastroduodenoscopy, flexible,                         transoral; with biopsy, single or multiple Diagnosis Code(s):     --- Professional ---                        K22.2, Esophageal obstruction                        Q39.9, Congenital malformation of esophagus,                         unspecified                        K44.9, Diaphragmatic hernia without obstruction or                         gangrene                        R13.10, Dysphagia, unspecified CPT  copyright 2019 American Medical Association. All rights reserved. The codes documented in this report are preliminary and upon coder review may  be revised to meet current compliance requirements. Jonathon Bellows, MD Jonathon Bellows MD, MD 11/01/2020 7:51:30 AM This report has been signed electronically. Number of Addenda: 0 Note Initiated On: 11/01/2020 7:39 AM Estimated Blood Loss:  Estimated blood loss: none.      [facility]

## 2020-11-01 NOTE — Transfer of Care (Signed)
Immediate Anesthesia Transfer of Care Note  Patient: Sean Reyes.  Procedure(s) Performed: COLONOSCOPY WITH PROPOFOL ESOPHAGOGASTRODUODENOSCOPY (EGD)  Patient Location: PACU  Anesthesia Type:General  Level of Consciousness: awake and sedated  Airway & Oxygen Therapy: Patient Spontanous Breathing and Patient connected to nasal cannula oxygen  Post-op Assessment: Report given to RN and Post -op Vital signs reviewed and stable  Post vital signs: Reviewed and stable  Last Vitals:  Vitals Value Taken Time  BP    Temp    Pulse    Resp    SpO2      Last Pain:  Vitals:   11/01/20 0715  TempSrc: Temporal  PainSc: 0-No pain         Complications: No notable events documented.

## 2020-11-01 NOTE — H&P (Signed)
Sean Bellows, MD [address], [place], [place], [place], [address] [address], [place], [place], [place], [address] Phone: [phone]  Fax: [phone]  Primary Care Physician:  [name], NP   Pre-Procedure History & Physical: HPI:  Sean Ferrare. is a 80 y.o. 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   Hyperlipidemia    Hypertension    Nocturia    S/P ablation of atrial fibrillation    Ablative therapy   Sleep apnea    CPAP    Spinal stenosis    Tachycardia, unspecified     Past Surgical History:  Procedure Laterality Date   ABLATION     ANTERIOR LAT LUMBAR FUSION N/A 06/27/2017   Procedure: Anterior Lateral Lumbar Interbody  Fusion - Lumbar Two-Lumbar Three - Lumbar Three-Lumbar Four, Posterior Lumbar Interbody Fusion Lumbar Four- Five;  Surgeon: Kary Kos, MD;  Location: [place];  Service: Neurosurgery;  Laterality: N/A;  Anterior Lateral Lumbar Interbody  Fusion - Lumbar Two-Lumbar Three - Lumbar Three-Lumbar Four, Posterior Lumbar Interbody Fusion Lumbar Four- Five   BACK SURGERY     CARDIOVERSION N/A 08/29/2018   Procedure: CARDIOVERSION;  Surgeon: Corey Skains, MD;  Location: [facility] ORS;  Service: Cardiovascular;  Laterality: N/A;   CARDIOVERSION N/A 09/24/2018   Procedure: CARDIOVERSION;  Surgeon: Corey Skains, MD;  Location: [facility] ORS;  Service: Cardiovascular;  Laterality: N/A;   COLONOSCOPY WITH PROPOFOL N/A 10/05/2015   Procedure:  COLONOSCOPY WITH PROPOFOL;  Surgeon: Lollie Sails, MD;  Location[facility] ENDOSCOPY;  Service: Endoscopy;  Laterality: N/A;   ESOPHAGOGASTRODUODENOSCOPY (EGD) WITH PROPOFOL N/A 04/01/2018   Procedure: ESOPHAGOGASTRODUODENOSCOPY (EGD) WITH PROPOFOL;  Surgeon: Lollie Sails, MD;  Location: [facility] ENDOSCOPY;  Service: Endoscopy;  Laterality: N/A;   HERNIA REPAIR     JOINT REPLACEMENT Bilateral    hips  RT+  LEFT X2    LUMBAR LAMINECTOMY/DECOMPRESSION MICRODISCECTOMY Left 09/13/2016   Procedure: Microdiscectomy - Lumbar two-three,  Lumbar three- - left;  Surgeon: Kary Kos, MD;  Location: [place];  Service: Neurosurgery;  Laterality: Left;   SPINAL CORD STIMULATOR INSERTION  07/08/2019   TONSILLECTOMY      Prior to Admission medications   Medication Sig Start Date End Date Taking? Authorizing Provider  acetaminophen (TYLENOL) 500 MG tablet Take 500 mg by mouth every 6 (six) hours as needed (pain).   Yes [provider]  ELIQUIS 5 MG TABS tablet Take 1 tablet (5 mg total) by mouth 2 (two) times daily. 09/28/20  Yes [name], Jolene T, NP  ferrous sulfate 325 (65 FE) MG tablet Take by mouth.   Yes [provider]  finasteride (PROSCAR) 5 MG tablet Take 1 tablet (5 mg total) by mouth daily. 08/20/20  Yes [name], Jolene T, NP  gabapentin (NEURONTIN) 300 MG  capsule Take 1 capsule (300 mg total) by mouth 2 (two) times daily. 11/25/19  Yes [name], Jolene T, NP  metFORMIN (GLUCOPHAGE-XR) 500 MG 24 hr tablet Take 2 tablets (1,000 mg total) by mouth 2 (two) times daily. 10/13/19  Yes [name], Jolene T, NP  metoprolol succinate (TOPROL-XL) 25 MG 24 hr tablet Take 1 tablet by mouth daily. 03/31/19  Yes [provider]  omeprazole (PRILOSEC) 40 MG capsule Take 1 capsule (40 mg total) by mouth daily. 09/29/20  Yes Sean Bellows, MD  pantoprazole (PROTONIX) 40 MG tablet Take 40 mg by mouth daily. 10/21/20  Yes [provider]  polyethylene glycol-electrolytes (NULYTELY) 420 g solution  Prepare according to package instructions. Starting at 5:00 PM: Drink one 8 oz glass of mixture every 15 minutes until you finish half of the jug. Five hours prior to procedure, drink 8 oz glass of mixture every 15 minutes until it is all gone. Make sure you do not drink anything 4 hours prior to your procedure. 09/29/20  Yes Sean Bellows, MD  propafenone (RYTHMOL SR) 425 MG 12 hr capsule Take 425 mg by mouth 2 (two) times daily.   Yes [provider]  rosuvastatin (CRESTOR) 5 MG tablet Take 1 tablet (5 mg total) by mouth daily. 11/25/19  Yes [name], Jolene T, NP  tamsulosin (FLOMAX) 0.4 MG CAPS capsule Take 0.8 mg by mouth daily. 07/18/19  Yes [provider]  trimethoprim-polymyxin b (POLYTRIM) ophthalmic solution Apply to eye. 07/14/20  Yes [provider]    Allergies as of 09/29/2020 - Review Complete 09/29/2020  Allergen Reaction Noted   Levaquin [levofloxacin in d5w] Anaphylaxis and Shortness Of Breath 07/27/2014   Shellfish allergy Anaphylaxis 08/26/2014   Amiodarone Other (See Comments) 04/19/2015   Adhesive [tape] Other (See Comments) 04/19/2018    Family History  Problem Relation Age of Onset   Brain cancer Mother    Kidney disease Neg Hx    Prostate cancer Neg Hx    Kidney cancer Neg Hx    Bladder Cancer Neg Hx     Social History   Socioeconomic History   Marital status: Married    Spouse name: Malachy Mood    Number of children: 2   Years of education: Not on file   Highest education level: High school graduate  Occupational History   Occupation: retired   Tobacco Use   Smoking status: Never   Smokeless tobacco: Never  Vaping Use   Vaping Use: Never used  Substance and Sexual Activity   Alcohol use: No   Drug use: No   Sexual activity: Not on file  Other Topics Concern   Not on file  Social History Narrative   Married   Gets regular exercise   Social Determinants of Health   Financial Resource Strain: Low Risk    Difficulty of Paying  Living Expenses: Not hard at all  Food Insecurity: No Food Insecurity   Worried About Charity fundraiser in the Last Year: Never true   Arboriculturist in the Last Year: Never true  Transportation Needs: No Transportation Needs   Lack of Transportation (Medical): No   Lack of Transportation (Non-Medical): No  Physical Activity: Inactive   Days of Exercise per Week: 0 days   Minutes of Exercise per Session: 0 min  Stress: No Stress Concern Present   Feeling of Stress : Not at all  Social Connections: Not on file  Intimate Partner Violence: Not on file    Review  of Systems: See HPI, otherwise negative ROS  Physical Exam: BP (!) 154/94   Pulse 77   Temp (!) 96.8 F (36 C) (Temporal)   Resp 20   Ht '6\' 1"'$  (1.854 m)   Wt 87.1 kg   SpO2 98%   BMI 25.33 kg/m  General:   Alert,  pleasant and cooperative in NAD Head:  Normocephalic and atraumatic. Neck:  Supple; no masses or thyromegaly. Lungs:  Clear throughout to auscultation, normal respiratory effort.    Heart:  +S1, +S2, Regular rate and rhythm, No edema. Abdomen:  Soft, nontender and nondistended. Normal bowel sounds, without guarding, and without rebound.   Neurologic:  Alert and  oriented x4;  grossly normal neurologically.  Impression/Plan: Sean Hacker. is here for an endoscopy and colonoscopy  to be performed for  evaluation of dysphagia and anemia     Risks, benefits, limitations, and alternatives regarding endoscopy have been reviewed with the patient.  Questions have been answered.  All parties agreeable.   Sean Bellows, MD  11/01/2020, 7:32 AM dysphagui

## 2020-11-01 NOTE — Anesthesia Preprocedure Evaluation (Signed)
Anesthesia Evaluation  Patient identified by MRN, date of birth, ID band Patient awake    Reviewed: Allergy & Precautions, H&P , NPO status , Patient's Chart, lab work & pertinent test results, reviewed documented beta blocker date and time   History of Anesthesia Complications (+) history of anesthetic complications  Airway Mallampati: II   Neck ROM: full    Dental  (+) Poor Dentition   Pulmonary neg pulmonary ROS, sleep apnea ,    Pulmonary exam normal        Cardiovascular Exercise Tolerance: Poor hypertension, On Medications + Peripheral Vascular Disease  + dysrhythmias Atrial Fibrillation  Rhythm:regular Rate:Normal     Neuro/Psych PSYCHIATRIC DISORDERS Anxiety Depression negative neurological ROS     GI/Hepatic Neg liver ROS, GERD  Medicated,  Endo/Other  negative endocrine ROSdiabetes  Renal/GU Renal disease  negative genitourinary   Musculoskeletal   Abdominal   Peds  Hematology  (+) Blood dyscrasia, anemia ,   Anesthesia Other Findings Past Medical History: No date: Anemia No date: Anxiety No date: Arthritis No date: Arthritis of neck No date: Atrial fibrillation (HCC) No date: Cataracts, bilateral No date: Complication of anesthesia     Comment:  pt reports low BP's after surgery at [facility] and               difficulty awakening No date: Depression No date: Diabetes ([place])     Comment:  dx 6-8 yrs ago No date: Dysrhythmia     Comment:  a-fib No date: GERD (gastroesophageal reflux disease)     Comment:  OCC TAKES ALKA SELTZER No date: History of kidney stones     Comment:  10-15 yrs ago No date: HOH (hard of hearing)     Comment:  bilateral hearing aids No date: Hyperlipidemia No date: Hypertension No date: Nocturia No date: S/P ablation of atrial fibrillation     Comment:  Ablative therapy No date: Sleep apnea     Comment:  CPAP  No date: Spinal stenosis No date: Tachycardia,  unspecified Past Surgical History: No date: ABLATION 06/27/2017: ANTERIOR LAT LUMBAR FUSION; N/A     Comment:  Procedure: Anterior Lateral Lumbar Interbody  Fusion -               Lumbar Two-Lumbar Three - Lumbar Three-Lumbar Four,               Posterior Lumbar Interbody Fusion Lumbar Four- Five;                Surgeon: Kary Kos, MD;  Location: [place];  Service:               Neurosurgery;  Laterality: N/A;  Anterior Lateral Lumbar               Interbody  Fusion - Lumbar Two-Lumbar Three - Lumbar               Three-Lumbar Four, Posterior Lumbar Interbody Fusion               Lumbar Four- Five No date: BACK SURGERY 08/29/2018: CARDIOVERSION; N/A     Comment:  Procedure: CARDIOVERSION;  Surgeon: Corey Skains,               MD;  Location: [facility]S;  Service: Cardiovascular;                Laterality: N/A; 09/24/2018: CARDIOVERSION; N/A     Comment:  Procedure: CARDIOVERSION;  Surgeon: Corey Skains,  MD;  Location: [facility];  Service: Cardiovascular;                Laterality: N/A; 10/05/2015: COLONOSCOPY WITH PROPOFOL; N/A     Comment:  Procedure: COLONOSCOPY WITH PROPOFOL;  Surgeon: Lollie Sails, MD;  Location: [facility];  Service:               Endoscopy;  Laterality: N/A; 04/01/2018: ESOPHAGOGASTRODUODENOSCOPY (EGD) WITH PROPOFOL; N/A     Comment:  Procedure: ESOPHAGOGASTRODUODENOSCOPY (EGD) WITH               PROPOFOL;  Surgeon: Lollie Sails, MD;  Location:               [facility] ENDOSCOPY;  Service: Endoscopy;  Laterality: N/A; No date: HERNIA REPAIR No date: JOINT REPLACEMENT; Bilateral     Comment:  hips  RT+  LEFT X2  09/13/2016: LUMBAR LAMINECTOMY/DECOMPRESSION MICRODISCECTOMY; Left     Comment:  Procedure: Microdiscectomy - Lumbar two-three,  Lumbar               three- - left;  Surgeon: Kary Kos, MD;  Location: [place];  Service: Neurosurgery;  Laterality: Left; 07/08/2019: SPINAL CORD STIMULATOR INSERTION No  date: TONSILLECTOMY BMI    Body Mass Index: 25.33 kg/m     Reproductive/Obstetrics negative OB ROS                             Anesthesia Physical Anesthesia Plan  ASA: 4  Anesthesia Plan: General   Post-op Pain Management:    Induction:   PONV Risk Score and Plan:   Airway Management Planned:   Additional Equipment:   Intra-op Plan:   Post-operative Plan:   Informed Consent: I have reviewed the patients History and Physical, chart, labs and discussed the procedure including the risks, benefits and alternatives for the proposed anesthesia with the patient or authorized representative who has indicated his/her understanding and acceptance.     Dental Advisory Given  Plan Discussed with: CRNA  Anesthesia Plan Comments:         Anesthesia Quick Evaluation

## 2020-11-01 NOTE — Anesthesia Procedure Notes (Signed)
Date/Time: 11/01/2020 7:47 AM Performed by: Vaughan Sine Pre-anesthesia Checklist: Patient identified, Emergency Drugs available, Patient being monitored, Suction available and Timeout performed Patient Re-evaluated:Patient Re-evaluated prior to induction Oxygen Delivery Method: Nasal cannula Preoxygenation: Pre-oxygenation with 100% oxygen Induction Type: IV induction Airway Equipment and Method: Bite block Placement Confirmation: positive ETCO2 and CO2 detector

## 2020-11-01 NOTE — Anesthesia Postprocedure Evaluation (Signed)
Anesthesia Post Note  Patient: Sean Reyes.  Procedure(s) Performed: COLONOSCOPY WITH PROPOFOL ESOPHAGOGASTRODUODENOSCOPY (EGD)  Patient location during evaluation: PACU Anesthesia Type: General Level of consciousness: awake and alert Pain management: pain level controlled Vital Signs Assessment: post-procedure vital signs reviewed and stable Respiratory status: spontaneous breathing, nonlabored ventilation, respiratory function stable and patient connected to nasal cannula oxygen Cardiovascular status: blood pressure returned to baseline and stable Postop Assessment: no apparent nausea or vomiting Anesthetic complications: no   No notable events documented.   Last Vitals:  Vitals:   11/01/20 0830 11/01/20 0840  BP: (!) 184/84 (!) 161/87  Pulse: 67 62  Resp: 15 18  Temp:    SpO2: 97% 98%    Last Pain:  Vitals:   11/01/20 0800  TempSrc: Temporal  PainSc:                  Molli Barrows

## 2020-11-01 NOTE — Op Note (Signed)
[name] Procedure Date: 11/01/2020 7:38 AM MRN: [id number] Account #: [id number] Date of Birth: 03-18-1940 Admit Type: Outpatient Age: 80 Room: [facility] ENDO ROOM 4 Gender: Male Note Status: Finalized Instrument Name: [name] Riling [id number] Procedure:             Colonoscopy Indications:           Rectal bleeding Providers:             Jonathon Bellows MD, MD Referring MD:          [name]. Ned Card (Referring MD) Medicines:             Monitored Anesthesia Care Complications:         No immediate complications. Procedure:             Pre-Anesthesia Assessment:                        - Prior to the procedure, a History and Physical was                         performed, and patient medications, allergies and                         sensitivities were reviewed. The patient's tolerance                         of previous anesthesia was reviewed.                        - The risks and benefits of the procedure and the                         sedation options and risks were discussed with the                         patient. All questions were answered and informed                         consent was obtained.                        - ASA Grade Assessment: III - A patient with severe                         systemic disease.                        After obtaining informed consent, the colonoscope was                         passed under direct vision. Throughout the procedure,                         the patient's blood pressure, pulse, and oxygen                         saturations were monitored continuously. The                         Colonoscope was introduced through the anus  and                         advanced to the the cecum, identified by the                         appendiceal orifice. The colonoscopy was performed                         with ease. The patient tolerated the procedure well.                         The  quality of the bowel preparation was good. Findings:      The perianal and digital rectal examinations were normal.      The entire examined colon appeared normal on direct and retroflexion       views.      Non-bleeding internal hemorrhoids were found during retroflexion. The       hemorrhoids were moderate, large and Grade I (internal hemorrhoids that       do not prolapse).      Multiple small-mouthed diverticula were found in the sigmoid colon. Impression:            - The entire examined colon is normal on direct and                         retroflexion views.                        - No specimens collected. Recommendation:        - Discharge patient to home (with escort).                        - Resume previous diet.                        - Continue present medications.                        - Repeat colonoscopy in 10 years for screening                         purposes.                        - Return to GI office as previously scheduled. Procedure Code(s):     --- Professional ---                        [phone], Colonoscopy, flexible; diagnostic, including                         collection of specimen(s) by brushing or washing, when                         performed (separate procedure) Diagnosis Code(s):     --- Professional ---                        K62.5, Hemorrhage of anus and rectum CPT copyright 2019 [employer]. All rights reserved. The codes documented in this report are preliminary and upon  coder review may  be revised to meet current compliance requirements. Jonathon Bellows, MD Jonathon Bellows MD, MD 11/01/2020 8:06:37 AM This report has been signed electronically. Number of Addenda: 0 Note Initiated On: 11/01/2020 7:38 AM Scope Withdrawal Time: 0 hours 8 minutes 16 seconds  Total Procedure Duration: 0 hours 10 minutes 49 seconds  Estimated Blood Loss:  Estimated blood loss: none. Estimated blood loss: none.      [facility]

## 2020-11-02 ENCOUNTER — Ambulatory Visit (INDEPENDENT_AMBULATORY_CARE_PROVIDER_SITE_OTHER): Payer: Medicare Other

## 2020-11-02 ENCOUNTER — Other Ambulatory Visit: Payer: Self-pay | Admitting: Nurse Practitioner

## 2020-11-02 ENCOUNTER — Telehealth: Payer: Medicare Other | Admitting: General Practice

## 2020-11-02 ENCOUNTER — Encounter: Payer: Self-pay | Admitting: Gastroenterology

## 2020-11-02 DIAGNOSIS — E119 Type 2 diabetes mellitus without complications: Secondary | ICD-10-CM

## 2020-11-02 DIAGNOSIS — G894 Chronic pain syndrome: Secondary | ICD-10-CM

## 2020-11-02 DIAGNOSIS — I152 Hypertension secondary to endocrine disorders: Secondary | ICD-10-CM

## 2020-11-02 DIAGNOSIS — I10 Essential (primary) hypertension: Secondary | ICD-10-CM

## 2020-11-02 DIAGNOSIS — M79661 Pain in right lower leg: Secondary | ICD-10-CM

## 2020-11-02 DIAGNOSIS — E1159 Type 2 diabetes mellitus with other circulatory complications: Secondary | ICD-10-CM

## 2020-11-02 DIAGNOSIS — E1143 Type 2 diabetes mellitus with diabetic autonomic (poly)neuropathy: Secondary | ICD-10-CM

## 2020-11-02 DIAGNOSIS — M48062 Spinal stenosis, lumbar region with neurogenic claudication: Secondary | ICD-10-CM

## 2020-11-02 DIAGNOSIS — Z9689 Presence of other specified functional implants: Secondary | ICD-10-CM

## 2020-11-02 MED ORDER — BENAZEPRIL HCL 40 MG PO TABS: 40.0000 mg | ORAL_TABLET | Freq: Every day | ORAL | 4 refills | Status: DC

## 2020-11-02 NOTE — Chronic Care Management (AMB) (Signed)
Chronic Care Management   CCM RN Visit Note  11/02/2020 Name: Sean Reyes. MRN: [id number] DOB: [birth year]  Subjective: Sean Reyes. is a 80 y.o. year old male who is a primary care patient of [name], [name], NP. The care management team was consulted for assistance with disease management and care coordination needs.    Engaged with patient by telephone for follow up visit in response to provider referral for case management and/or care coordination services.   Consent to Services:  The patient was given information about Chronic Care Management services, agreed to services, and gave verbal consent prior to initiation of services.  Please see initial visit note for detailed documentation.   Patient agreed to services and verbal consent obtained.   Assessment: Review of patient past medical history, allergies, medications, health status, including review of consultants reports, laboratory and other test data, was performed as part of comprehensive evaluation and provision of chronic care management services.   SDOH (Social Determinants of Health) assessments and interventions performed:  SDOH Interventions    Flowsheet Row Most Recent Value  SDOH Interventions   Financial Strain Interventions Intervention Not Indicated  Housing Interventions Intervention Not Indicated  Intimate Partner Violence Interventions Intervention Not Indicated  Social Connections Interventions Intervention Not Indicated, Other (Comment)  [good support system]  Transportation Interventions Intervention Not Indicated        CCM Care Plan  Allergies  Allergen Reactions   Levaquin [Levofloxacin In D5w] Anaphylaxis and Shortness Of Breath   Shellfish Allergy Anaphylaxis    Has used duraprep, betadine and ioban in previous surgeries in 2019 and 2018 without issue   Amiodarone Other (See Comments)    Tremors and thyroid toxicity   Adhesive [Tape] Other (See Comments)    Little red bumps under the  dressing.  He questions whether is latex related    Outpatient Encounter Medications as of 11/02/2020  Medication Sig   benazepril (LOTENSIN) 40 MG tablet Take 1 tablet (40 mg total) by mouth daily.   metoprolol succinate (TOPROL-XL) 25 MG 24 hr tablet Take 1 tablet by mouth daily.   acetaminophen (TYLENOL) 500 MG tablet Take 500 mg by mouth every 6 (six) hours as needed (pain).   ELIQUIS 5 MG TABS tablet Take 1 tablet (5 mg total) by mouth 2 (two) times daily.   ferrous sulfate 325 (65 FE) MG tablet Take by mouth.   finasteride (PROSCAR) 5 MG tablet Take 1 tablet (5 mg total) by mouth daily.   gabapentin (NEURONTIN) 300 MG capsule Take 1 capsule (300 mg total) by mouth 2 (two) times daily.   metFORMIN (GLUCOPHAGE-XR) 500 MG 24 hr tablet Take 2 tablets (1,000 mg total) by mouth 2 (two) times daily.   omeprazole (PRILOSEC) 40 MG capsule Take 1 capsule (40 mg total) by mouth daily.   pantoprazole (PROTONIX) 40 MG tablet Take 40 mg by mouth daily.   polyethylene glycol-electrolytes (NULYTELY) 420 g solution Prepare according to package instructions. Starting at 5:00 PM: Drink one 8 oz glass of mixture every 15 minutes until you finish half of the jug. Five hours prior to procedure, drink 8 oz glass of mixture every 15 minutes until it is all gone. Make sure you do not drink anything 4 hours prior to your procedure.   propafenone (RYTHMOL SR) 425 MG 12 hr capsule Take 425 mg by mouth 2 (two) times daily.   rosuvastatin (CRESTOR) 5 MG tablet Take 1 tablet (5 mg total) by mouth daily.   tamsulosin (  FLOMAX) 0.4 MG CAPS capsule Take 0.8 mg by mouth daily.   trimethoprim-polymyxin b (POLYTRIM) ophthalmic solution Apply to eye.   No facility-administered encounter medications on file as of 11/02/2020.    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   Postlaminectomy syndrome, lumbar region 01/16/2019   History of fusion of lumbar spine (L2-L5) 01/16/2019   Chronic radicular lumbar pain 01/16/2019   HNP (herniated nucleus pulposus), lumbar 04/29/2018   Advanced care planning/counseling discussion 09/28/2016   Spinal stenosis, lumbar region, 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/13/2016   Hyperlipidemia associated with type 2 diabetes mellitus ([place]) 07/14/2015   Anemia 06/30/2015   BPH with obstruction/lower urinary tract symptoms 06/02/2015   OSA (obstructive sleep apnea) 03/23/2015   Type 2 diabetes mellitus with diabetic chronic kidney disease ([place]) 01/05/2015   Hypertension associated with diabetes ([place]) 09/28/2014   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[place]) 09/28/2014   H/O prior ablation treatment 10/19/2011   Paroxysmal atrial fibrillation ([place]) 10/19/2011    Conditions to be addressed/monitored:HTN, DMII, and chronic pain and discomfort  Care Plan : RNCM: Hypertension (Adult)  Updates made by Vanita Ingles, RN since 11/02/2020 12:00 AM     Problem: RNCM: Hypertension (Hypertension)   Priority: Medium     Long-Range Goal: RNCM: Hypertension Monitored   Start Date: 03/16/2020  Expected End Date: 08/02/2021  This Visit's Progress: On track  Priority: Medium  Note:   Objective:  Last practice recorded BP readings:  BP Readings from Last 3 Encounters:  11/01/20 (!) 161/87  10/25/20 130/76  09/29/20 130/73     Most recent eGFR/CrCl: No results found for: EGFR  No components found for: CRCL Current Barriers:  Knowledge Deficits related to basic understanding of hypertension pathophysiology and self care  management Knowledge Deficits related to understanding of medications prescribed for management of hypertension Unable to independently manage HTN Unable to perform IADLs independently- with back pain exacerbations sometimes he cannot do IADLs Does not contact provider office for questions/concerns Case Manager Clinical Goal(s):  patient will verbalize understanding of plan for hypertension management patient will attend all scheduled medical appointments: 12-10-2020 at 10 am. The patient also has appointments with vascular specialist patient will demonstrate improved adherence to prescribed treatment plan for hypertension as evidenced by taking all medications as prescribed, monitoring and recording blood pressure as directed, adhering to low sodium/DASH diet patient will demonstrate improved health management independence as evidenced by checking blood pressure as directed and notifying PCP if SBP>160 or DBP > 90, taking all medications as prescribe, and adhering to a low sodium diet as discussed.  patient will verbalize basic understanding of hypertension disease process and self health management plan as evidenced by compliance with heart healthy diet, compliance to medications, and working with the CCM team to maintain health and well being.  Interventions:  Collaboration with [name], NP regarding development and update of comprehensive plan of care as evidenced by provider attestation and co-signature Inter-disciplinary care team collaboration (see longitudinal plan of care) Evaluation of current treatment plan related to hypertension self management  and patient's adherence to plan as established by provider. 05-25-2020: The patient is having some concerns with low blood pressure. States sometimes he is having readings of 100/50 with light headedness and dizziness. Sometimes he does not take his medications or cuts it in half. Denies light headed or dizziness today. Has follow up with  pcp on 05-26-2020: Encouraged the patient to discuss with the pcp about changes. 08-03-2020: Patient states his blood pressure "goes up and down".  Denies any light headedness or dizziness.  Yesterday, patient had an episode where his heart rate dropped to 45/min.  He was symptomatic - absolutely no energy.  Patient has a watch that tracks his heart rate so this is how he learned of his heart rate dropping to 45/min.  He reports his heart rate is typically in the mid 50's - 60's/min. I recommended he make an appointment with his cardiologist soon to address his heart rate and possible medication adjustments and move up his scheduled PCP appointment to address heart rate, blood pressure, possible medication adjustments and an area on his toe that his daughter wants assessed - especially with patient being diabetic.  Patient agreed. 11-02-2020: The patient states his blood pressure is "erratic". The patient states in the am it is usually good but afternoon to night it can be in the 170's over 90's. States most recently it was 170/96. Review of goal of <222 systolic and <97 diastolic. The patient states he is out of his benazepril 40 mg and wanted to know if the pcp could send in a refill? Will collaborate with the pcp for recommendations for patient request. The patient denies any headaches, flushed skin, lightheadedness or dizziness. Ask the patient to write down values for the pcp to have on hand at upcoming visit in October. Verbalized he would start doing this. Will continue to monitor.  Provided education to patient re: stroke prevention, s/s of heart attack and stroke, DASH diet, complications of uncontrolled blood pressure. 05-26-2020: Education on systolic range of 989-QJ 194 and diastolic 60 to 90. Discussed safety when feeling light headed and dizziness. 11-02-2020: Reviewed with the patient when systolic is >174 and diastolic >08 it puts the patient at increased risk of heart attack and stroke. Also review of  pain causing the blood pressure to go up. Review of safety and to monitor for changes in condition. Headaches, light headedness, dizziness, or flushed skin. The patient has a good understanding of his conditions and how to effectively manage.  Reviewed medications with patient and discussed importance of compliance. 05-25-2020: The patient sometimes has cut his medication in half or not taken due to low blood pressure. 11-02-2020:  Patient is compliant with medications.  Patient states he is taking Metoprolol 25 mg 1 tab currently. He needs a refill for benazepril 40 mg daily. Will collaborate with the pcp for refill request.  Discussed plans with patient for ongoing care management follow up and provided patient with direct contact information for care management team Advised patient, providing education and rationale, to monitor blood pressure daily and record, calling PCP for findings outside established parameters. 11-02-2020: The patient encouraged to write down questions to ask the pcp on visit in October,  especially parameters for taking medications and keeping a log of readings for pcp to review Reviewed scheduled/upcoming provider appointments including: 12-10-2020 at 10 am Patient Goals/Self-Care Activities  patient will:  - Self administers medications as prescribed Attends all scheduled provider appointments Calls provider office for new concerns, questions, or BP  outside discussed parameters Checks BP and records as discussed Follows a low sodium diet/DASH diet - blood pressure trends reviewed - depression screen reviewed - home or ambulatory blood pressure monitoring encouraged Follow Up Plan: Telephone follow up appointment with care management team member scheduled for: 12-28-2020 at 9:00 am    Task: RNCM: Identify and Monitor Blood Pressure Elevation Completed 11/02/2020  Outcome: Positive  Note:   Care Management Activities:    - blood pressure trends reviewed - depression  screen reviewed - home or ambulatory blood pressure monitoring encouraged       Care Plan : RNCM: Chronic Pain (Adult)  Updates made by Vanita Ingles, RN since 11/02/2020 12:00 AM     Problem: RNCM: Pain Management Plan (Chronic Pain)   Priority: High     Long-Range Goal: RNCM: Pain Management Plan Developed   Start Date: 03/16/2020  Expected End Date: 08/02/2021  This Visit's Progress: Not on track  Priority: High  Note:   Current Barriers:  Knowledge Deficits related to managing acute/chronic pain Non-adherence to scheduled provider appointments Non-adherence to prescribed medication regimen Difficulty obtaining medications Chronic Disease Management support and education needs related to chronic pain Unable to independently manage pain exacerbations, has a pain stiumlator Unable to perform IADLs independently Does not contact provider office for questions/concerns  Nurse Case Manager Clinical Goal(s):   patient will verbalize understanding of plan for managing pain patient will attend all scheduled medical appointments: 09-09-2020  patient will demonstrate use of different relaxation  skills and/or diversional activities to assist with pain reduction (distraction, imagery, relaxation, massage, acupressure, TENS, heat, and cold application  patient will report pain at a level less than 3 to 4 on a 10-10 rating scale  patient will use pharmacological and nonpharmacological pain relief strategies patient will verbalize acceptable level of pain relief and ability to engage in desired activities patient will engage in desired activities without an increase in pain level  Interventions:  Collaboration with [name], NP regarding development and update of comprehensive plan of care as evidenced by provider attestation and co-signature Inter-disciplinary care team collaboration (see longitudinal plan of care) - deep breathing, relaxation and mindfulness use promoted -  effectiveness of pharmacologic therapy monitored. 05-25-2020: The patient is scared to take pain medications because of the experience his daughter has had with the pain medications she has taken and is addicted to it. The patient says the gabapentin is not effective in helping with pain. He is unsure what to do and is waiting for xray results. 11-02-2020: The patient has been working with the pain provider and is also scheduled to see a vascular provider this month. The patient states his back pain is stable and the stimulator is working the best it can. The right shin pain is causing him a lot of issues and his legs and back are weak. He is going to start going to [employer] to walk on the treadmill and also lift weights. Feels that if he works muscles this will help him a lot. He has worked with PT in the past and it helped some. Uses his "crutch" when ambulating and denies falls. Reviewed safety concerns due to pain and weakness. The patient states he is pacing his activity. Will continue to monitor for changes.  - motivation and barriers to change assessed and addressed. 11-02-2020: The patient wants to feel better because he wants to be active and enjoy the things he likes to do.  - pain assessed- 05-25-2020: The  patient states his pain is worse. He has had xrays and is waiting for the results. The stimulator may have become dislodged. Will discuss with the specialist. Will continue to monitor. 11-02-2020: Pain is stable in his back. Worse pain to right shin. States that he is unsure what is going on with this. Stems from an old injury many years ago. The patient along with the pain doctor feel like it has something to do with the nerve endings. The patient will see a vascular provider this month and is hopeful for answers instead of hearing its something he has to live with. Will continue to monitor.  - pain treatment goals reviewed. 11-02-2020: The patient wants his pain under control so he can do the  things he likes to do - participation in physical therapy encouraged. 11-02-2020: Has worked with PT in the past.  - premedication prior to activity encouraged Evaluation of current treatment plan related to chronic pain and patient's adherence to plan as established by provider. 05-25-2020: The patient will see pcp tomorrow. The patient is concerned as the pain stimulator is not working as well as he had hoped. He is hopeful the xrays will show what seems to be the issue. 08-03-2020:  Patient had cervical MRI done recently. 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  Patient states his pain is a "7 or 8" with walking.  He says walking is difficult at times.  Patient has pain stimulator and thinks a change in it's settings is needed possibly. Per chart review, a bilateral C4, C5, C6 medial branch nerve block has been ordered and patient has agreed to more forward with this procedure. 11-02-2020: The patient is working with pain specialist and will see a vascular provider. Still having lost of issues when he is ambulating. States his back does not hurt. The biggest area of pain right now is his right shin. Will continue to monitor for changes.  Advised patient to contact the pain specialist and discuss new onset of pain and see if the pain stimulator needs to be adjusted.  08-03-2020: Patient thinks his pain stimulator needs an adjustment in it's settings for better pain management. 11-02-2020; The patient states that he is working with the manufacturer for adjustment in the pain stimulator.  Provided education to patient re: review of recent activity and safety.  Patient verbalized current symptoms started on Sunday. He is having to use his cane at this time due to pain exacerbation.  Reviewed medications with patient and discussed compliance. 11-02-2020:  Patient is compliant with medications. Discussed plans with patient for ongoing care management follow up and provided patient  with direct contact information for care management team Allow patient to maintain a diary of pain ratings, timing, precipitating events, medications, treatments, and what works best to relieve pain,  Refer to support groups and self-help groups Educate patient about the use of pharmacological interventions for pain management- antianxiety, antidepressants, NSAIDS, opioid analgesics,  Explain the importance of lifestyle modifications to effective pain management   Patient Goals/Self Care Activities:  Patient verbalizes understanding of plan to effectively manage pain and discomfort  Self-administers medications as prescribed Attends all scheduled provider appointments Calls pharmacy for medication refills Calls provider office for new concerns or questions - mutually acceptable comfort goal set - pain assessed - pain treatment goals reviewed - patient response to treatment assessed - sharing of pain management plan with teachers and other caregivers encouraged  Follow Up Plan: Telephone follow up appointment with  care management team member scheduled for: 12-28-2020 at 9:00 am      Task: RNCM: Partner to Develop Chronic Pain Management Plan Completed 11/02/2020  Outcome: Positive  Note:   Care Management Activities:    - mutually acceptable comfort goal set - pain assessed - pain treatment goals reviewed - patient response to treatment assessed - sharing of pain management plan with teachers and other caregivers encouraged    Notes: patient has a pain stimulator     Care Plan : RNCM: Urinary and Bladder Issues  Updates made by Vanita Ingles, RN since 11/02/2020 12:00 AM  Completed 11/02/2020   Problem: RNCM: Urinary and Bladder issuse Resolved 11/02/2020  Priority: High     Care Plan : RNCM: Diabetes Type 2 (Adult)  Updates made by Vanita Ingles, RN since 11/02/2020 12:00 AM     Problem: RNCM: Disease Progression (Diabetes, Type 2)   Priority: Medium     Long-Range  Goal: RNCM: Disease Progression Prevented or Minimized   Start Date: 11/02/2020  Expected End Date: 11/02/2021  This Visit's Progress: On track  Priority: Medium  Note:   Objective:  Lab Results  Component Value Date   HGBA1C 6.6 09/09/2020   Lab Results  Component Value Date   CREATININE 1.38 (H) 09/09/2020   CREATININE 1.29 (H) 06/10/2020   CREATININE 1.25 02/26/2020   Lab Results  Component Value Date   EGFR 52 (L) 09/09/2020   Current Barriers:  Knowledge Deficits related to basic Diabetes pathophysiology and self care/management Knowledge Deficits related to medications used for management of diabetes Unable to independently manage DM Use of steroids at time due to chronic pain issues Case Manager Clinical Goal(s):  patient will demonstrate improved adherence to prescribed treatment plan for diabetes self care/management as evidenced by: daily monitoring and recording of CBG  adherence to ADA/ carb modified diet exercise 4 days/week adherence to prescribed medication regimen contacting provider for new or worsened symptoms or questions Interventions:  Collaboration with [name], NP regarding development and update of comprehensive plan of care as evidenced by provider attestation and co-signature Inter-disciplinary care team collaboration (see longitudinal plan of care) Provided education to patient about basic DM disease process Reviewed medications with patient and discussed importance of medication adherence Discussed plans with patient for ongoing care management follow up and provided patient with direct contact information for care management team Provided patient with written educational materials related to hypo and hyperglycemia and importance of correct treatment Reviewed scheduled/upcoming provider appointments including: 12-10-2020 at 10 am Advised patient, providing education and rationale, to check cbg daily  and record, calling pcp for findings outside  established parameters.   Review of patient status, including review of consultants reports, relevant laboratory and other test results, and medications completed. Self-Care Activities - Self administers oral medications as prescribed Attends all scheduled provider appointments Checks blood sugars as prescribed and utilize hyper and hypoglycemia protocol as needed Adheres to prescribed ADA/carb modified Patient Goals: - check blood sugar at prescribed times - check blood sugar before and after exercise - check blood sugar if I feel it is too high or too low - enter blood sugar readings and medication or insulin into daily log - take the blood sugar log to all doctor visits - change to whole grain breads, cereal, pasta - drink 6 to 8 glasses of water each day - eat fish at least once per week - fill half of plate with vegetables - join a weight loss  program - keep a food diary - limit fast food meals to no more than 1 per week - manage portion size - prepare main meal at home 3 to 5 days each week - read food labels for fat, fiber, carbohydrates and portion size - reduce red meat to 2 to 3 times a week - switch to sugar-free drinks - schedule appointment with eye doctor - check feet daily for cuts, sores or redness - keep feet up while sitting - trim toenails straight across - wash and dry feet carefully every day - wear comfortable, cotton socks - wear comfortable, well-fitting shoes Follow Up Plan: Telephone follow up appointment with care management team member scheduled for: 12-28-2020 at 0900 am     Task: Monitor and Manage Follow-up for Comorbidities Completed 11/02/2020  Outcome: Positive  Note:   Care Management Activities:    - activity based on tolerance and functional limitations encouraged - completion of annual dilated eye exam confirmed - completion of annual foot exam verified - healthy lifestyle promoted - medication side effects managed - modest weight loss  (5 percent) promoted - quality of sleep assessed - reduction of sedentary activity encouraged - response to pharmacologic therapy monitored - signs/symptoms of comorbidities identified - strategies to maintain sexual function and relationship encouraged - vital signs and trends reviewed    Notes:      Plan:Telephone follow up appointment with care management team member scheduled for:  12-28-2020 at 0900 am  [place], MSN, [facility] Mobile: [phone]

## 2020-11-02 NOTE — Patient Instructions (Signed)
Visit Information  PATIENT GOALS:  Goals Addressed             This Visit's Progress    RNCM: Cope with Chronic Pain       Timeframe:  Long-Range Goal Priority:  High Start Date:   11-02-2020                          Expected End Date:     11-02-2021                  Follow Up Date 12/28/2020    - learn how to meditate - learn relaxation techniques - practice acceptance of chronic pain - practice relaxation or meditation daily - spend time with positive people - tell myself I can (not I can't) - think of new ways to do favorite things - use distraction techniques - use relaxation during pain    Why is this important?   Stress makes chronic pain feel worse.  Feelings like depression, anxiety, stress and anger can make your body more sensitive to pain.  Learning ways to cope with stress or depression may help you find some relief from the pain.     Notes: 11-02-2020: The patient states back pain is stable and the stimulator is doing about the best it will do. The biggest pain issue right now is the right shin pain he is having from an old injury from several years ago. The patient is scheduled to see a vascular provider this month. He is hoping it will help determine something that will help him with the pain he is experiencing. He has a gym membership to [employer] and he is wanting to go and see about exercises that will help with strengthening his leg and back muscles. He knows he has to preserve muscle function. Will continue to monitor.      RNCM: Monitor and Manage My Blood Sugar-Diabetes Type 2       Timeframe:  Long-Range Goal Priority:  Medium Start Date:        11-02-2020                     Expected End Date:   11-02-2021                    Follow Up Date 12/28/2020    - check blood sugar at prescribed times - check blood sugar before and after exercise - check blood sugar if I feel it is too high or too low - enter blood sugar readings and medication or insulin  into daily log - take the blood sugar log to all doctor visits  Lab Results  Component Value Date   HGBA1C 6.6 09/09/2020       Why is this important?   Checking your blood sugar at home helps to keep it from getting very high or very low.  Writing the results in a diary or log helps the doctor know how to care for you.  Your blood sugar log should have the time, date and the results.  Also, write down the amount of insulin or other medicine that you take.  Other information, like what you ate, exercise done and how you were feeling, will also be helpful.     Notes: 11-02-2020: The patient states she is doing well with management of his blood sugars. Is aware that prednisone can cause his blood sugars to go up. The  patient states that he had not checked it this am but it was 130 yesterday am. Denies any lows at this time. Will continue to monitor.         Patient verbalizes understanding of instructions provided today and agrees to view in [place].   Telephone follow up appointment with care management team member scheduled for:12-28-2020 at 0900 am  [place], MSN, [facility] Mobile: [phone]

## 2020-11-03 LAB — SURGICAL PATHOLOGY

## 2020-11-05 ENCOUNTER — Other Ambulatory Visit (INDEPENDENT_AMBULATORY_CARE_PROVIDER_SITE_OTHER): Payer: Self-pay | Admitting: Nurse Practitioner

## 2020-11-05 DIAGNOSIS — M79661 Pain in right lower leg: Secondary | ICD-10-CM

## 2020-11-05 DIAGNOSIS — Z9889 Other specified postprocedural states: Secondary | ICD-10-CM

## 2020-11-07 NOTE — Progress Notes (Signed)
MRN : [id number]  Sean Fulop. is a 80 y.o. (1941/02/12) male who presents with chief complaint of check right leg.  History of Present Illness:  Sean Reyes presents today for evaluation of his lower extremities.  In particular, he is having increasing difficulties with his right lower extremity.  He is quite specific relating this increased right shin pain to a traumatic event many many years ago.  However, his concern is over the last year or so the intensity of the pain and the sensitivity in this area that elicits the pain has increased exponentially.  It is relatively point specific and he motions directly to a circular scarred area in the mid calf region just medial to the shin.  He relates that even pressure from the blankets at night can elicit pain in that walking is now eliciting pain.  Is a confounding factor he also has severe LS spine disease.  He had back surgery back in March 2018 by Dr. [name].  He did quite well with this. About 5 weeks later, he fell and reinjured his back.     ABI's at [facility], which were normal with triphasic waveforms.   Venous duplex obtained today is negative for reflux no deep venous obstruction scarring or other abnormalities.  Essentially it is a normal venous scan  No outpatient medications have been marked as taking for the 11/08/20 encounter (Appointment) with Delana Meyer, Dolores Lory, MD.    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   Hyperlipidemia    Hypertension    Nocturia     S/P ablation of atrial fibrillation    Ablative therapy   Sleep apnea    CPAP    Spinal stenosis    Tachycardia, unspecified     Past Surgical History:  Procedure Laterality Date   ABLATION     ANTERIOR LAT LUMBAR FUSION N/A 06/27/2017   Procedure: Anterior Lateral Lumbar Interbody  Fusion - Lumbar Two-Lumbar Three - Lumbar Three-Lumbar Four, Posterior Lumbar Interbody Fusion Lumbar Four- Five;  Surgeon: Kary Kos, MD;  Location: [facility];  Service: Neurosurgery;  Laterality: N/A;  Anterior Lateral Lumbar Interbody  Fusion - Lumbar Two-Lumbar Three - Lumbar Three-Lumbar Four, Posterior Lumbar Interbody Fusion Lumbar Four- Five   BACK SURGERY     CARDIOVERSION N/A 08/29/2018   Procedure: CARDIOVERSION;  Surgeon: Corey Skains, MD;  Location: [facility] ORS;  Service: Cardiovascular;  Laterality: N/A;   CARDIOVERSION N/A 09/24/2018   Procedure: CARDIOVERSION;  Surgeon: Corey Skains, MD;  Location: [facility];  Service: Cardiovascular;  Laterality: N/A;   COLONOSCOPY WITH PROPOFOL N/A 10/05/2015   Procedure: COLONOSCOPY WITH PROPOFOL;  Surgeon: Lollie Sails, MD;  Location: [facility] ENDOSCOPY;  Service: Endoscopy;  Laterality: N/A;   COLONOSCOPY WITH PROPOFOL N/A 11/01/2020   Procedure: COLONOSCOPY WITH PROPOFOL;  Surgeon: Jonathon Bellows, MD;  Location: [facility]  ENDOSCOPY;  Service: Gastroenterology;  Laterality: N/A;   ESOPHAGOGASTRODUODENOSCOPY N/A 11/01/2020   Procedure: ESOPHAGOGASTRODUODENOSCOPY (EGD);  Surgeon: Jonathon Bellows, MD;  Location: [facility] ENDOSCOPY;  Service: Gastroenterology;  Laterality: N/A;   ESOPHAGOGASTRODUODENOSCOPY (EGD) WITH PROPOFOL N/A 04/01/2018   Procedure: ESOPHAGOGASTRODUODENOSCOPY (EGD) WITH PROPOFOL;  Surgeon: Lollie Sails, MD;  Location: [facility] ENDOSCOPY;  Service: Endoscopy;  Laterality: N/A;   HERNIA REPAIR     JOINT REPLACEMENT Bilateral    hips  RT+  LEFT X2    LUMBAR LAMINECTOMY/DECOMPRESSION MICRODISCECTOMY Left 09/13/2016   Procedure: Microdiscectomy - Lumbar  two-three,  Lumbar three- - left;  Surgeon: Kary Kos, MD;  Location: [facility];  Service: Neurosurgery;  Laterality: Left;   SPINAL CORD STIMULATOR INSERTION  07/08/2019   TONSILLECTOMY      Social History Social History   Tobacco Use   Smoking status: Never   Smokeless tobacco: Never  Vaping Use   Vaping Use: Never used  Substance Use Topics   Alcohol use: No   Drug use: No    Family History Family History  Problem Relation Age of Onset   Brain cancer Mother    Kidney disease Neg Hx    Prostate cancer Neg Hx    Kidney cancer Neg Hx    Bladder Cancer Neg Hx     Allergies  Allergen Reactions   Levaquin [Levofloxacin In D5w] Anaphylaxis and Shortness Of Breath   Shellfish Allergy Anaphylaxis    Has used duraprep, betadine and ioban in previous surgeries in 2019 and 2018 without issue   Amiodarone Other (See Comments)    Tremors and thyroid toxicity   Adhesive [Tape] Other (See Comments)    Little red bumps under the dressing.  He questions whether is latex related     REVIEW OF SYSTEMS (Negative unless checked)  Constitutional: [] Weight loss  [] Fever  [] Chills Cardiac: [] Chest pain   [] Chest pressure   [] Palpitations   [] Shortness of breath when laying flat   [] Shortness of breath with exertion. Vascular:  [] Pain in legs with walking   [] Pain in legs at rest  [] History of DVT   [] Phlebitis   [x] Swelling in legs   [] Varicose veins   [] Non-healing ulcers Pulmonary:   [] Uses home oxygen   [] Productive cough   [] Hemoptysis   [] Wheeze  [] COPD   [] Asthma Neurologic:  [] Dizziness   [] Seizures   [] History of stroke   [] History of TIA  [] Aphasia   [] Vissual changes   [] Weakness or numbness in arm   [x] Weakness or numbness in leg Musculoskeletal:   [] Joint swelling   [] Joint pain   [x] Low back pain Hematologic:  [] Easy bruising  [] Easy bleeding   [] Hypercoagulable state   [] Anemic Gastrointestinal:  [] Diarrhea   [] Vomiting  [] Gastroesophageal reflux/heartburn   [] Difficulty  swallowing. Genitourinary:  [] Chronic kidney disease   [] Difficult urination  [] Frequent urination   [] Blood in urine Skin:  [] Rashes   [] Ulcers  Psychological:  [] History of anxiety   []  History of major depression.  Physical Examination  There were no vitals filed for this visit. There is no height or weight on file to calculate BMI. Gen: WD/WN, NAD Head: [place]/AT, No temporalis wasting.  Ear/Nose/Throat: Hearing grossly intact, nares w/o erythema or drainage, pinna without lesions Eyes: PER, EOMI, sclera nonicteric.  Neck: Supple, no gross masses.  No JVD.  Pulmonary:  Good air movement, no audible wheezing, no use of accessory muscles.  Cardiac: RRR, precordium not hyperdynamic. Vascular:  scattered varicosities present bilaterally.  Mild venous stasis  changes to the legs bilaterally.  2+ soft pitting edema small circular scar mid calf just to the medial side of midline.  Tender to touch Vessel Right Left  Radial Palpable Palpable  Gastrointestinal: soft, non-distended. No guarding/no peritoneal signs.  Musculoskeletal: M/S 5/5 throughout.  No deformity.  Neurologic: CN 2-12 intact. Pain and light touch intact in extremities.  Symmetrical.  Speech is fluent. Motor exam as listed above. Psychiatric: Judgment intact, Mood & affect appropriate for pt's clinical situation. Dermatologic: Venous rashes no ulcers noted.  No changes consistent with cellulitis. Lymph : No lichenification or skin changes of chronic lymphedema.  CBC Lab Results  Component Value Date   WBC 5.5 09/09/2020   HGB 11.1 (L) 09/09/2020   HCT 33.8 (L) 09/09/2020   MCV 93 09/09/2020   PLT 158 09/09/2020    BMET    Component Value Date/Time   NA 137 09/09/2020 1133   NA 138 07/01/2012 0705   K 4.1 09/09/2020 1133   K 3.9 07/01/2012 0705   CL 98 09/09/2020 1133   CL 104 07/01/2012 0705   CO2 22 09/09/2020 1133   CO2 29 07/01/2012 0705   GLUCOSE 187 (H) 09/09/2020 1133   GLUCOSE 212 (H) 06/25/2019 1528    GLUCOSE 120 (H) 07/01/2012 0705   BUN 19 09/09/2020 1133   BUN 14 07/01/2012 0705   CREATININE 1.38 (H) 09/09/2020 1133   CREATININE 1.27 07/01/2012 0705   CALCIUM 9.3 09/09/2020 1133   CALCIUM 9.2 07/01/2012 0705   GFRNONAA 55 (L) 02/26/2020 1102   GFRNONAA 57 (L) 07/01/2012 0705   GFRAA 64 02/26/2020 1102   GFRAA >60 07/01/2012 0705   CrCl cannot be calculated (Patient's most recent lab result is older than the maximum 21 days allowed.).  COAG Lab Results  Component Value Date   INR 1.0 11/07/2018   INR 0.9 04/29/2018   INR 1.09 06/27/2017    Radiology No results found.   Assessment/Plan 1. Pain of right lower extremity Recommend:  I do not find evidence of Vascular pathology that would explain the patient's symptoms.  Noninvasive studies including venous ultrasound of the legs do not identify vascular problems.  The patient has atypical pain symptoms for vascular disease  I suspect the patient is c/o pseudoclaudication with an atypical presentation or perhaps a neuroma as a result from his remote trauma that is continued to grow and is now becoming increasingly symptomatic.  Patient should have an evaluation of his LS spine which I defer to the spine service.  The patient should continue physical therapy and begin a more formal exercise program. The patient should continue his anticoagulation and antiplatelet therapy and aggressive treatment of the lipid abnormalities.  Patient will follow-up with me on a PRN basis  Further work-up of her lower extremity pain is deferred to the spine service which I will contact to discuss my thoughts.     2. Peripheral vascular disease ([place]) Recommend:  I do not find evidence of life style limiting vascular disease. The patient specifically denies life style limitation.  Previous noninvasive studies including ABI's of the legs do not identify critical vascular problems.  The patient should continue walking and begin a more  formal exercise program. The patient should continue his antiplatelet therapy and aggressive treatment of the lipid abnormalities.  Patient will follow-up with me on a PRN basis   3. Paroxysmal atrial fibrillation (HCC) Continue antiarrhythmia medications as already ordered, these medications have been reviewed and there are no changes at  this time.  Continue anticoagulation as ordered by Cardiology Service   4. Hypertension associated with diabetes ([place]) Continue antihypertensive medications as already ordered, these medications have been reviewed and there are no changes at this time.   5. Type 2 diabetes mellitus with diabetic autonomic neuropathy, without long-term current use of insulin (HCC) Continue hypoglycemic medications as already ordered, these medications have been reviewed and there are no changes at this time.  Hgb A1C to be monitored as already arranged by primary service    Hortencia Pilar, MD  11/07/2020 2:28 PM

## 2020-11-08 ENCOUNTER — Ambulatory Visit (INDEPENDENT_AMBULATORY_CARE_PROVIDER_SITE_OTHER): Payer: Medicare Other | Admitting: Vascular Surgery

## 2020-11-08 ENCOUNTER — Other Ambulatory Visit: Payer: Self-pay

## 2020-11-08 ENCOUNTER — Ambulatory Visit (INDEPENDENT_AMBULATORY_CARE_PROVIDER_SITE_OTHER): Payer: Medicare Other

## 2020-11-08 VITALS — BP 136/78 | HR 61 | Ht 73.0 in | Wt 191.0 lb

## 2020-11-08 DIAGNOSIS — E1159 Type 2 diabetes mellitus with other circulatory complications: Secondary | ICD-10-CM

## 2020-11-08 DIAGNOSIS — M79604 Pain in right leg: Secondary | ICD-10-CM

## 2020-11-08 DIAGNOSIS — I480 Paroxysmal atrial fibrillation: Secondary | ICD-10-CM

## 2020-11-08 DIAGNOSIS — Z9889 Other specified postprocedural states: Secondary | ICD-10-CM

## 2020-11-08 DIAGNOSIS — M79661 Pain in right lower leg: Secondary | ICD-10-CM | POA: Diagnosis not present

## 2020-11-08 DIAGNOSIS — E1143 Type 2 diabetes mellitus with diabetic autonomic (poly)neuropathy: Secondary | ICD-10-CM

## 2020-11-08 DIAGNOSIS — I739 Peripheral vascular disease, unspecified: Secondary | ICD-10-CM | POA: Diagnosis not present

## 2020-11-08 DIAGNOSIS — I152 Hypertension secondary to endocrine disorders: Secondary | ICD-10-CM | POA: Diagnosis not present

## 2020-11-09 ENCOUNTER — Encounter (INDEPENDENT_AMBULATORY_CARE_PROVIDER_SITE_OTHER): Payer: Self-pay | Admitting: Vascular Surgery

## 2020-11-09 DIAGNOSIS — M79606 Pain in leg, unspecified: Secondary | ICD-10-CM | POA: Insufficient documentation

## 2020-11-10 ENCOUNTER — Ambulatory Visit (INDEPENDENT_AMBULATORY_CARE_PROVIDER_SITE_OTHER): Payer: Medicare Other | Admitting: Gastroenterology

## 2020-11-10 ENCOUNTER — Other Ambulatory Visit: Payer: Self-pay

## 2020-11-10 ENCOUNTER — Encounter: Payer: Self-pay | Admitting: Gastroenterology

## 2020-11-10 VITALS — BP 150/72 | HR 71 | Temp 98.1°F | Ht 73.0 in | Wt 193.0 lb

## 2020-11-10 DIAGNOSIS — D649 Anemia, unspecified: Secondary | ICD-10-CM

## 2020-11-10 DIAGNOSIS — R1310 Dysphagia, unspecified: Secondary | ICD-10-CM

## 2020-11-10 DIAGNOSIS — Z8619 Personal history of other infectious and parasitic diseases: Secondary | ICD-10-CM | POA: Diagnosis not present

## 2020-11-10 DIAGNOSIS — K648 Other hemorrhoids: Secondary | ICD-10-CM

## 2020-11-10 DIAGNOSIS — R197 Diarrhea, unspecified: Secondary | ICD-10-CM | POA: Diagnosis not present

## 2020-11-10 NOTE — Progress Notes (Signed)
Sean Bellows MD, MRCP(U.K) [address]  [place]  [place], [place] [address]  Main: [phone]  Fax: [phone]   Primary Care Physician: [name], NP  Primary Gastroenterologist:  Dr. Jonathon Reyes   Chief complaint: Follow-up of anemia, dysphagia   HPI: Sean Reyes. is a 80 y.o. male   Summary of history : Initially referred and seen on 09/29/2020 for anemia and stool occult test positive.He has previously been a patient of [facility] last seen at their office in July 2021 for diarrhea. Positive for Salmonella treated with antibiotics, esophagitis, GERD, dysphagia.  Last colonoscopy in 2017 showed diverticulosis multiple nonbleeding AVMs throughout the colon.  Last EGD was in February 2020 by Dr. Gustavo Lah for dysphagia found to have a hiatal hernia, LA grade B esophagitis.  Unfortunately no biopsies were taken to rule out eosinophilic esophagitis.  He is on metformin.  Stool occult test was positive on 09/14/2020, iron studies are normal with a ferritin of 66 and normal TIBC.  Hemoglobin 11.1 g with an MCV of 93.  His creatinine is 1.38.   He denies any overt blood loss.  He is on Eliquis.  He has been having diarrhea on and off long-term.  Denies any use of artificial sugars or sweeteners.  He is on metformin.  Multiple bowel movements a day watery in nature.  Dysphagia with pills getting stuck in his upper part of his throat.  Morphological's.  Prior modified barium swallow showed no gross abnormality.  Not on a PPI.Previously was on Protonix.  History of esophagitis.   Interval history   09/29/2020-11/10/2020 11/01/2020: EGD: 4 Schatzki's ring seen at the GE junction dilated to 18 mm biopsies esophagus were taken medium size hiatal hernia was noted.  Colonoscopy performed on the same day.  Nonbleeding internal hemorrhoids noted.  Otherwise normal.  Biopsies esophagus showed no abnormalities. 10/04/2020: C. difficile toxin positive.  Commenced on  vancomycin 4 times daily. 09/29/2020: B12 and folate normal.  The diarrhea has resolved after course of vancomycin and after his colonoscopy the diarrhea has returned.  Difficulty with swallowing only his metformin tablets otherwise no issues swallowing.  Food goes down okay.  No other complaints at this point of time.  Current Outpatient Medications  Medication Sig Dispense Refill   acetaminophen (TYLENOL) 500 MG tablet Take 500 mg by mouth every 6 (six) hours as needed (pain).     benazepril (LOTENSIN) 40 MG tablet Take 1 tablet (40 mg total) by mouth daily. 90 tablet 4   ELIQUIS 5 MG TABS tablet Take 1 tablet (5 mg total) by mouth 2 (two) times daily. 180 tablet 4   ferrous sulfate 325 (65 FE) MG tablet Take by mouth.     finasteride (PROSCAR) 5 MG tablet Take 1 tablet (5 mg total) by mouth daily. 90 tablet 0   gabapentin (NEURONTIN) 300 MG capsule Take 1 capsule (300 mg total) by mouth 2 (two) times daily. 180 capsule 4   metFORMIN (GLUCOPHAGE-XR) 500 MG 24 hr tablet Take 2 tablets (1,000 mg total) by mouth 2 (two) times daily. 360 tablet 0   metoprolol succinate (TOPROL-XL) 25 MG 24 hr tablet Take 1 tablet by mouth daily.     pantoprazole (PROTONIX) 40 MG tablet Take by mouth.     polyethylene glycol-electrolytes (NULYTELY) 420 g solution Prepare according to package instructions. Starting at 5:00 PM: Drink one 8 oz glass of mixture every 15 minutes until you finish half of the jug. Five  hours prior to procedure, drink 8 oz glass of mixture every 15 minutes until it is all gone. Make sure you do not drink anything 4 hours prior to your procedure. 4000 mL 0   propafenone (RYTHMOL SR) 425 MG 12 hr capsule Take 425 mg by mouth 2 (two) times daily.     rosuvastatin (CRESTOR) 5 MG tablet Take 1 tablet (5 mg total) by mouth daily. 90 tablet 3   tamsulosin (FLOMAX) 0.4 MG CAPS capsule Take 0.8 mg by mouth daily.     trimethoprim-polymyxin b (POLYTRIM) ophthalmic solution Apply to eye.     No  current facility-administered medications for this visit.    Allergies as of 11/10/2020 - Review Complete 11/10/2020  Allergen Reaction Noted   Levaquin [levofloxacin in d5w] Anaphylaxis and Shortness Of Breath 07/27/2014   Shellfish allergy Anaphylaxis 08/26/2014   Amiodarone Other (See Comments) 04/19/2015   Adhesive [tape] Other (See Comments) 04/19/2018    ROS:  General: Negative for anorexia, weight loss, fever, chills, fatigue, weakness. ENT: Negative for hoarseness, difficulty swallowing , nasal congestion. CV: Negative for chest pain, angina, palpitations, dyspnea on exertion, peripheral edema.  Respiratory: Negative for dyspnea at rest, dyspnea on exertion, cough, sputum, wheezing.  GI: See history of present illness. GU:  Negative for dysuria, hematuria, urinary incontinence, urinary frequency, nocturnal urination.  Endo: Negative for unusual weight change.    Physical Examination:   BP (!) 150/72   Pulse 71   Temp 98.1 F (36.7 C) (Oral)   Ht 6\' 1"  (1.854 m)   Wt 193 lb (87.5 kg)   BMI 25.46 kg/m   General: Well-nourished, well-developed in no acute distress.  Eyes: No icterus. Conjunctivae pink. Neuro: Alert and oriented x 3.  Grossly intact. Skin: Warm and dry, no jaundice.   Psych: Alert and cooperative, normal mood and affect.   Imaging Studies: VAS [place] LOWER EXTREMITY VENOUS REFLUX  Result Date: 11/08/2020  Lower Venous Reflux Study Patient Name:  Sean Reyes.  Date of Exam:   11/08/2020 Medical Rec #: [id number]         Accession #:    [id number] Date of Birth: [birth year]         Patient Gender: M Patient Age:   26 years Exam Location:  [facility] Procedure:      VAS [place] LOWER EXTREMITY VENOUS REFLUX Referring Phys: Eulogio Ditch --------------------------------------------------------------------------------  Indications: Pain.  Performing Technologist: Almira Coaster RVS  Examination Guidelines: A complete evaluation includes B-mode imaging,  spectral Doppler, color Doppler, and power Doppler as needed of all accessible portions of each vessel. Bilateral testing is considered an integral part of a complete examination. Limited examinations for reoccurring indications may be performed as noted. The reflux portion of the exam is performed with the patient in reverse Trendelenburg. Significant venous reflux is defined as >500 ms in the superficial venous system, and >1 second in the deep venous system.  Venous Reflux Times +--------------+---------+------+-----------+------------+--------+ RIGHT         Reflux NoRefluxReflux TimeDiameter cmsComments                         Yes                                  +--------------+---------+------+-----------+------------+--------+ CFV           no                                             +--------------+---------+------+-----------+------------+--------+  FV prox       no                                             +--------------+---------+------+-----------+------------+--------+ FV mid        no                                             +--------------+---------+------+-----------+------------+--------+ FV dist       no                                             +--------------+---------+------+-----------+------------+--------+ Popliteal     no                                             +--------------+---------+------+-----------+------------+--------+ GSV at SFJ    no                            .53              +--------------+---------+------+-----------+------------+--------+ GSV prox thighno                            .44              +--------------+---------+------+-----------+------------+--------+ GSV mid thigh           yes    3653 ms      .36              +--------------+---------+------+-----------+------------+--------+ GSV dist thighno                            .36               +--------------+---------+------+-----------+------------+--------+ GSV at knee   no                            .40              +--------------+---------+------+-----------+------------+--------+ GSV prox calf no                            .28              +--------------+---------+------+-----------+------------+--------+   Summary: Right: - No evidence of deep vein thrombosis seen in the right lower extremity, from the common femoral through the popliteal veins.  - No evidence of superficial venous thrombosis in the right lower extremity.  - There is no evidence of venous reflux seen in the right lower extremity. - Venous reflux is noted in the right greater saphenous vein in the Mid thigh.  *See table(s) above for measurements and observations. Electronically signed by Hortencia Pilar MD on 11/08/2020 at 6:03:27 PM.    Final     Assessment and Plan:   Sean Reyes. is a 80 y.o. y/o male here to follow-up for  a normocytic anemia with normal iron studies and B12 folate.  Likely secondary to normal GI losses, differentials include bone marrow issues such as MDS versus anemia of chronic disease.  Dysphagia status post dilation.  Diarrhea treated with vancomycin for C. difficile.  Plan 1.  Normocytic anemia: Normal B12 folate, normal iron studies.  Follow-up with primary care provider.  If there is still concern for anemia would warrant referral to hematology.  2.  C. difficile diarrhea status posttreatment with vancomycin.  His bowel movements returned back to normal completely after the colonoscopy started having diarrhea having up to 4 times a day.  We will recheck a stool to rule out recurrence of C. difficile if stool tests are negative will consider holding metformin as a trial to see if it helps with the diarrhea.  3.  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  Still has difficulty swallowing his metformin otherwise no issues.  Likely due to the size of the  tablet.  4.  Fecal occult blood test positive likely secondary to bleeding from internal hemorrhoids no issues presently.  If there is any overt bleeding we can discuss further in the future.  Dr Sean Bellows  MD,MRCP [facility]) Follow up in as needed

## 2020-11-11 ENCOUNTER — Other Ambulatory Visit: Payer: Self-pay | Admitting: Gastroenterology

## 2020-11-11 DIAGNOSIS — K909 Intestinal malabsorption, unspecified: Secondary | ICD-10-CM | POA: Diagnosis not present

## 2020-11-11 DIAGNOSIS — K529 Noninfective gastroenteritis and colitis, unspecified: Secondary | ICD-10-CM | POA: Diagnosis not present

## 2020-11-11 DIAGNOSIS — D649 Anemia, unspecified: Secondary | ICD-10-CM | POA: Diagnosis not present

## 2020-11-12 DIAGNOSIS — I10 Essential (primary) hypertension: Secondary | ICD-10-CM | POA: Diagnosis not present

## 2020-11-12 DIAGNOSIS — I152 Hypertension secondary to endocrine disorders: Secondary | ICD-10-CM | POA: Diagnosis not present

## 2020-11-12 DIAGNOSIS — E1159 Type 2 diabetes mellitus with other circulatory complications: Secondary | ICD-10-CM

## 2020-11-12 DIAGNOSIS — E1143 Type 2 diabetes mellitus with diabetic autonomic (poly)neuropathy: Secondary | ICD-10-CM

## 2020-11-14 ENCOUNTER — Encounter: Payer: Self-pay | Admitting: Gastroenterology

## 2020-11-15 ENCOUNTER — Telehealth: Payer: Self-pay

## 2020-11-15 ENCOUNTER — Telehealth: Payer: Self-pay | Admitting: Gastroenterology

## 2020-11-15 MED ORDER — DIFICID 200 MG PO TABS: 200.0000 mg | ORAL_TABLET | Freq: Two times a day (BID) | ORAL | 0 refills | Status: DC

## 2020-11-15 NOTE — Telephone Encounter (Signed)
Called patient and documented in other telephone call

## 2020-11-15 NOTE — Telephone Encounter (Signed)
Patient verbalized understanding about positive C Diff. Sent medication to the pharmacy

## 2020-11-15 NOTE — Telephone Encounter (Signed)
Pt. Calling again, this time he says the meds that he was prescribed is too expensive. He is requesting a call back

## 2020-11-15 NOTE — Progress Notes (Signed)
Caryl Pina can you let patient know since Herb Grays is away

## 2020-11-15 NOTE — Telephone Encounter (Signed)
-----   Message from Jonathon Bellows, MD sent at 11/15/2020  2:40 PM EDT ----- Caryl Pina can you let patient know since Herb Grays is away

## 2020-11-15 NOTE — Progress Notes (Signed)
Can we send in dificid for 10 days please as he is positive for c diff

## 2020-11-15 NOTE — Telephone Encounter (Signed)
Pt. Is calling about lab results

## 2020-11-16 NOTE — Telephone Encounter (Signed)
Sent Medication through [facility]. Informed patient of this information. Informed patient that they will contact him or [place] to let us know how much the medication is and if he is okay with it then they will mail it to him.

## 2020-11-17 LAB — GI PROFILE, STOOL, PCR

## 2020-11-17 LAB — C DIFFICILE, CYTOTOXIN B

## 2020-11-17 LAB — CALPROTECTIN, FECAL: Calprotectin, Fecal: 92 ug/g (ref 0–120)

## 2020-11-17 LAB — C DIFFICILE TOXINS A+B W/RFLX: C difficile Toxins A+B, EIA: NEGATIVE

## 2020-11-17 MED ORDER — VANCOMYCIN HCL 125 MG PO CAPS: ORAL_CAPSULE | ORAL | 0 refills | Status: AC

## 2020-11-17 NOTE — Addendum Note (Signed)
Addended by: Ulyess Blossom L on: 11/17/2020 03:36 PM   Modules accepted: Orders

## 2020-11-17 NOTE — Telephone Encounter (Signed)
Called patient and went over the instructions for patient for the Vancomycin. Sent medication to the pharmacy. Patient verbalized understanding of instructions and states he will call us when he is done with the antibiotic and if he has any problem.

## 2020-11-17 NOTE — Telephone Encounter (Signed)
Patient left a message for call back to see if we have found out any information on the Dificid medication. He states he is having diarrhea and is about willing to pay a 1000 dollars for his medication to see if the diarrhea goes away. Called [facility] and they said medication would be 1000 dollars also. Can you please recommend something for him

## 2020-11-24 DIAGNOSIS — I480 Paroxysmal atrial fibrillation: Secondary | ICD-10-CM | POA: Diagnosis not present

## 2020-11-24 DIAGNOSIS — I10 Essential (primary) hypertension: Secondary | ICD-10-CM | POA: Diagnosis not present

## 2020-11-24 DIAGNOSIS — E1169 Type 2 diabetes mellitus with other specified complication: Secondary | ICD-10-CM | POA: Diagnosis not present

## 2020-11-24 DIAGNOSIS — E785 Hyperlipidemia, unspecified: Secondary | ICD-10-CM | POA: Diagnosis not present

## 2020-11-24 DIAGNOSIS — N1831 Chronic kidney disease, stage 3a: Secondary | ICD-10-CM | POA: Diagnosis not present

## 2020-11-24 DIAGNOSIS — E782 Mixed hyperlipidemia: Secondary | ICD-10-CM | POA: Diagnosis not present

## 2020-12-01 ENCOUNTER — Ambulatory Visit: Payer: Self-pay | Admitting: *Deleted

## 2020-12-01 ENCOUNTER — Encounter: Payer: Self-pay | Admitting: Nurse Practitioner

## 2020-12-01 ENCOUNTER — Other Ambulatory Visit: Payer: Self-pay

## 2020-12-01 ENCOUNTER — Ambulatory Visit (INDEPENDENT_AMBULATORY_CARE_PROVIDER_SITE_OTHER): Payer: Medicare Other | Admitting: Nurse Practitioner

## 2020-12-01 VITALS — BP 140/82 | HR 73 | Ht 73.0 in | Wt 190.0 lb

## 2020-12-01 DIAGNOSIS — E538 Deficiency of other specified B group vitamins: Secondary | ICD-10-CM

## 2020-12-01 DIAGNOSIS — D649 Anemia, unspecified: Secondary | ICD-10-CM | POA: Diagnosis not present

## 2020-12-01 NOTE — Assessment & Plan Note (Addendum)
Chronic. Exacerbated. Patient is taking iron and b12 supplement. SOB has significantly worsened.  Will draw lab work today to evaluate anemia.  Recommend he also follow up with Dr. Vicente Males due to ongoing diarrhea and C Diff.  Will make further recommendations based on lab results. Patient also has follow up with PCP on October 28.

## 2020-12-01 NOTE — Progress Notes (Signed)
BP 140/82   Pulse 73   Ht 6\' 1"  (1.854 m)   Wt 190 lb (86.2 kg)   BMI 25.07 kg/m    Subjective:    Patient ID: Sean Hacker., male    DOB: [birth year], 80 y.o.   MRN: [id number]  HPI: Sean Pewitt. is a 80 y.o. male  Chief Complaint  Patient presents with   Fatigue   Diarrhea    On vancomycin for C-diff   Patient presents to clinic with complaints of fatigue and diarrhea.  Patient is seeing Dr. Vicente Males for C diff.  He also has anemia.  Patient is concerned that his anemia has worsened.  He saw his cardiologist recently who did not think it was cardiac related.  States that when he walks he is so out of breath that he feels like he ran 100 yards.    Patient states he is still having diarrhea but it has improved.  He hasn't had any stools today.  However, last night he went 3 times last night in less than 1 hour.  Relevant past medical, surgical, family and social history reviewed and updated as indicated. Interim medical history since our last visit reviewed. Allergies and medications reviewed and updated.  Review of Systems  Constitutional:  Positive for fatigue.  Respiratory:  Positive for shortness of breath.   Gastrointestinal:  Positive for diarrhea.   Per HPI unless specifically indicated above     Objective:    BP 140/82   Pulse 73   Ht 6\' 1"  (1.854 m)   Wt 190 lb (86.2 kg)   BMI 25.07 kg/m   Wt Readings from Last 3 Encounters:  12/01/20 190 lb (86.2 kg)  11/10/20 193 lb (87.5 kg)  11/08/20 191 lb (86.6 kg)    Physical Exam Vitals and nursing note reviewed.  Constitutional:      General: He is not in acute distress.    Appearance: Normal appearance. He is not ill-appearing, toxic-appearing or diaphoretic.  HENT:     Head: Normocephalic.     Right Ear: External ear normal.     Left Ear: External ear normal.     Nose: Nose normal. No congestion or rhinorrhea.     Mouth/Throat:     Mouth: Mucous membranes are moist.  Eyes:     General:        Right  eye: No discharge.        Left eye: No discharge.     Extraocular Movements: Extraocular movements intact.     Conjunctiva/sclera: Conjunctivae normal.     Pupils: Pupils are equal, round, and reactive to light.  Cardiovascular:     Rate and Rhythm: Normal rate and regular rhythm.     Heart sounds: No murmur heard. Pulmonary:     Effort: Pulmonary effort is normal. No respiratory distress.     Breath sounds: Normal breath sounds. No wheezing, rhonchi or rales.  Abdominal:     General: Abdomen is flat. Bowel sounds are normal.  Musculoskeletal:     Cervical back: Normal range of motion and neck supple.     Comments: Uses crutch on right arm.  Skin:    General: Skin is warm and dry.     Capillary Refill: Capillary refill takes less than 2 seconds.  Neurological:     General: No focal deficit present.     Mental Status: He is alert and oriented to person, place, and time.  Psychiatric:  Mood and Affect: Mood normal.        Behavior: Behavior normal.        Thought Content: Thought content normal.        Judgment: Judgment normal.    Results for orders placed or performed in visit on 11/11/20  Calprotectin, Fecal  Result Value Ref Range   Calprotectin, Fecal 92 0 - 120 ug/g  C difficile Toxins A+B W/Rflx   ST  Result Value Ref Range   C difficile Toxins A+B, EIA Negative Negative  C difficile, Cytotoxin B   ST  Result Value Ref Range   C diff Toxin B Final report    Result 1 Comment   GI Profile, Stool, PCR  Result Value Ref Range   Campylobacter Not Detected Not Detected   C difficile toxin A/B Detected (A) Not Detected   Plesiomonas shigelloides Not Detected Not Detected   Salmonella Not Detected Not Detected   Vibrio Not Detected Not Detected   Vibrio cholerae Not Detected Not Detected   Yersinia enterocolitica Not Detected Not Detected   Enteroaggregative E coli Not Detected Not Detected   Enteropathogenic E coli Not Detected Not Detected   Enterotoxigenic E  coli Not Detected Not Detected   Shiga-toxin-producing E coli Not Detected Not Detected   E coli W109 Not applicable Not Detected   Shigella/Enteroinvasive E coli Not Detected Not Detected   Cryptosporidium Not Detected Not Detected   Cyclospora cayetanensis Not Detected Not Detected   Entamoeba histolytica Not Detected Not Detected   Giardia lamblia Not Detected Not Detected   Adenovirus F 40/41 Not Detected Not Detected   Astrovirus Not Detected Not Detected   Norovirus GI/GII Not Detected Not Detected   Rotavirus A Not Detected Not Detected   Sapovirus Not Detected Not Detected      Assessment & Plan:   Problem List Items Addressed This Visit       Other   Anemia - Primary    Chronic. Exacerbated. Patient is taking iron and b12 supplement. SOB has significantly worsened.  Will draw lab work today to evaluate anemia.  Recommend he also follow up with Dr. Vicente Males due to ongoing diarrhea and C Diff.  Will make further recommendations based on lab results. Patient also has follow up with PCP on October 28.      Relevant Orders   CBC w/Diff   B12   Iron, TIBC and Ferritin Panel   Other Visit Diagnoses     B12 deficiency       Relevant Orders   B12        Follow up plan: Return if symptoms worsen or fail to improve.

## 2020-12-01 NOTE — Telephone Encounter (Signed)
Patient is calling to report he has increased fatigue and weakness. He has seen cardiology and reports everything is good there. He is concerned that he has no energy at all. Patient is taking high dose antibiotics for GI infection- but that is the only problem he reports.

## 2020-12-01 NOTE — Telephone Encounter (Signed)
Summary: fatigue   The patient would like to be contacted to address general discomfort fatigue and tiredness they've been experiencing for several weeks   The patient shares that they have previously had issues with anemia   The patient would like to discuss this further      Reason for Disposition  [1] MODERATE weakness (i.e., interferes with work, school, normal activities) AND [2] cause unknown  (Exceptions: weakness with acute minor illness, or weakness from poor fluid intake)  Answer Assessment - Initial Assessment Questions 1. DESCRIPTION: "Describe how you are feeling."     Patient has been feeling fatigued and lacks energy- cardiologist visit was normal. Patient has questions about seeing hematologist 2. SEVERITY: "How bad is it?"  "Can you stand and walk?"   - MILD - Feels weak or tired, but does not interfere with work, school or normal activities   - [place] to stand and walk; weakness interferes with work, school, or normal activities   - SEVERE - Unable to stand or walk     moderate 3. ONSET:  "When did the weakness begin?"     Several weeks 4. CAUSE: "What do you think is causing the weakness?"     anemia 5. MEDICINES: "Have you recently started a new medicine or had a change in the amount of a medicine?"     Iron supplements  6. OTHER SYMPTOMS: "Do you have any other symptoms?" (e.g., chest pain, fever, cough, SOB, vomiting, diarrhea, bleeding, other areas of pain)     Feels he is just unable to push through 7. PREGNANCY: "Is there any chance you are pregnant?" "When was your last menstrual period?"     N/a  Protocols used: Weakness (Generalized) and Fatigue-A-AH

## 2020-12-02 DIAGNOSIS — G629 Polyneuropathy, unspecified: Secondary | ICD-10-CM | POA: Diagnosis not present

## 2020-12-02 DIAGNOSIS — R29898 Other symptoms and signs involving the musculoskeletal system: Secondary | ICD-10-CM | POA: Diagnosis not present

## 2020-12-02 DIAGNOSIS — M438X9 Other specified deforming dorsopathies, site unspecified: Secondary | ICD-10-CM | POA: Diagnosis not present

## 2020-12-02 LAB — CBC WITH DIFFERENTIAL/PLATELET
Basophils Absolute: 0 10*3/uL (ref 0.0–0.2)
Basos: 0 %
EOS (ABSOLUTE): 0 10*3/uL (ref 0.0–0.4)
Eos: 1 %
Hematocrit: 37.1 % — ABNORMAL LOW (ref 37.5–51.0)
Hemoglobin: 12.2 g/dL — ABNORMAL LOW (ref 13.0–17.7)
Immature Grans (Abs): 0 10*3/uL (ref 0.0–0.1)
Immature Granulocytes: 0 %
Lymphocytes Absolute: 2.1 10*3/uL (ref 0.7–3.1)
Lymphs: 32 %
MCH: 30.1 pg (ref 26.6–33.0)
MCHC: 32.9 g/dL (ref 31.5–35.7)
MCV: 92 fL (ref 79–97)
Monocytes Absolute: 0.8 10*3/uL (ref 0.1–0.9)
Monocytes: 12 %
Neutrophils Absolute: 3.7 10*3/uL (ref 1.4–7.0)
Neutrophils: 55 %
Platelets: 162 10*3/uL (ref 150–450)
RBC: 4.05 x10E6/uL — ABNORMAL LOW (ref 4.14–5.80)
RDW: 13.4 % (ref 11.6–15.4)
WBC: 6.5 10*3/uL (ref 3.4–10.8)

## 2020-12-02 LAB — IRON,TIBC AND FERRITIN PANEL
Ferritin: 77 ng/mL (ref 30–400)
Iron Saturation: 15 % (ref 15–55)
Iron: 48 ug/dL (ref 38–169)
Total Iron Binding Capacity: 328 ug/dL (ref 250–450)
UIBC: 280 ug/dL (ref 111–343)

## 2020-12-02 LAB — VITAMIN B12: Vitamin B-12: 427 pg/mL (ref 232–1245)

## 2020-12-02 NOTE — Progress Notes (Signed)
Please let patient know that his anemia has improved some from prior. I recommend he follow up with Dr. Vicente Males regarding the fatigue and diarrhea.  Please let me know if he has any additional questions.

## 2020-12-06 ENCOUNTER — Other Ambulatory Visit: Payer: Self-pay | Admitting: Nurse Practitioner

## 2020-12-10 ENCOUNTER — Other Ambulatory Visit: Payer: Self-pay

## 2020-12-10 ENCOUNTER — Encounter: Payer: Self-pay | Admitting: Nurse Practitioner

## 2020-12-10 ENCOUNTER — Ambulatory Visit (INDEPENDENT_AMBULATORY_CARE_PROVIDER_SITE_OTHER): Payer: Medicare Other | Admitting: Nurse Practitioner

## 2020-12-10 VITALS — BP 132/80 | HR 71 | Temp 98.0°F | Wt 191.8 lb

## 2020-12-10 DIAGNOSIS — I152 Hypertension secondary to endocrine disorders: Secondary | ICD-10-CM | POA: Diagnosis not present

## 2020-12-10 DIAGNOSIS — E1169 Type 2 diabetes mellitus with other specified complication: Secondary | ICD-10-CM | POA: Diagnosis not present

## 2020-12-10 DIAGNOSIS — E1143 Type 2 diabetes mellitus with diabetic autonomic (poly)neuropathy: Secondary | ICD-10-CM

## 2020-12-10 DIAGNOSIS — N1831 Chronic kidney disease, stage 3a: Secondary | ICD-10-CM

## 2020-12-10 DIAGNOSIS — D6869 Other thrombophilia: Secondary | ICD-10-CM | POA: Diagnosis not present

## 2020-12-10 DIAGNOSIS — D508 Other iron deficiency anemias: Secondary | ICD-10-CM

## 2020-12-10 DIAGNOSIS — E1159 Type 2 diabetes mellitus with other circulatory complications: Secondary | ICD-10-CM | POA: Diagnosis not present

## 2020-12-10 DIAGNOSIS — G894 Chronic pain syndrome: Secondary | ICD-10-CM | POA: Diagnosis not present

## 2020-12-10 DIAGNOSIS — E785 Hyperlipidemia, unspecified: Secondary | ICD-10-CM | POA: Diagnosis not present

## 2020-12-10 DIAGNOSIS — E1122 Type 2 diabetes mellitus with diabetic chronic kidney disease: Secondary | ICD-10-CM | POA: Diagnosis not present

## 2020-12-10 DIAGNOSIS — I480 Paroxysmal atrial fibrillation: Secondary | ICD-10-CM

## 2020-12-10 DIAGNOSIS — Z9689 Presence of other specified functional implants: Secondary | ICD-10-CM | POA: Diagnosis not present

## 2020-12-10 LAB — BAYER DCA HB A1C WAIVED: HB A1C (BAYER DCA - WAIVED): 6.5 % — ABNORMAL HIGH (ref 4.8–5.6)

## 2020-12-10 MED ORDER — METFORMIN HCL ER 500 MG PO TB24: 500.0000 mg | ORAL_TABLET | Freq: Two times a day (BID) | ORAL | 4 refills | Status: DC

## 2020-12-10 NOTE — Assessment & Plan Note (Signed)
Chronic, ongoing.  Continue current medication regimen and collaboration with cardiology.  Monitor CBC closely due to anemia.

## 2020-12-10 NOTE — Assessment & Plan Note (Addendum)
Chronic, ongoing with A1C 6.5% today.  Continue Benazepril for kidney protection (he continues to maintain this on as needed basis only).  Due to diarrhea concerns will reduce Metformin XR to 500 MG BID to see if benefit.  Educated him on kidney disease and monitoring.  Consider referral to nephrology in future if ever any worsening of function noted -- recent labs stable.  BMP today.

## 2020-12-10 NOTE — Progress Notes (Signed)
BP 132/80 (BP Location: Left Arm)   Pulse 71   Temp 98 F (36.7 C) (Oral)   Wt 191 lb 12.8 oz (87 kg)   SpO2 98%   BMI 25.30 kg/m    Subjective:    Patient ID: Sean Reyes., male    DOB: December 11, 1940, 80 y.o.   MRN: [id number]  HPI: Sean Vizzini. is a 80 y.o. male  Chief Complaint  Patient presents with   Hyperlipidemia   Hypertension    Patient states he has been keeping record of his blood pressure readings and states he forgot it at home and was meaning to bring it for the provider to look over at today's visit. Patient states he has not taken a blood pressure pill for the past 3 days because he says that his blood pressure has been fine.   Diabetes    Patient states he has had a recent Diabetic Eye Exam this year and states he has asked their office to send over the results. Patient states they still have not sent it over and he has Cataract surgery.    Anemia   Atrial Fibrillation   DIABETES Recent A1C in July 6.6%.  Continues on Metformin daily -- this was recommended to hold by GI if ongoing diarrhea after negative C Diff testing. Hypoglycemic episodes:no Polydipsia/polyuria: no Visual disturbance: no Chest pain: no Paresthesias: no Glucose Monitoring: yes             Accucheck frequency: a couple times week             Fasting glucose: <130 on average             Post prandial:             Evening:             Before meals: Taking Insulin?: no             Long acting insulin:             Short acting insulin: Blood Pressure Monitoring: a few times a day Retinal Examination: Not Up to Date -- Dr. Atilano Median in [place] Exam: Up to Date Pneumovax: Up to Date Influenza: Up to Date Aspirin: no   GERD Continues on Protonix daily, which he reports doe not help a lot and needs to speak to Dr. Vicente Males.  Recently followed by GI for anemia and was treated for C Diff infection with repeat testing on 11/10/20 -- they recommended hematology referral if ongoing  anemia. GERD control status: stable Satisfied with current treatment? yes Heartburn frequency: daily Medication side effects: no  Medication compliance: fluctuating Dysphagia: no Odynophagia:  no Hematemesis: no Blood in stool: no EGD: yes    HYPERTENSION / HYPERLIPIDEMIA/A-FIB Followed by Dr. Nehemiah Massed for cardiology.  Last seen 11/24/20.  Is to be taking Benazepril 5 MG and Metoprolol XL 25 MG daily -- he reports he only takes Benazepril if needed and if BP elevated.  Continues on Eliquis, started over a year ago for atrial fibrillation. Taking Rosuvastatin 5 MG.  Continues Propafenone.   Satisfied with current treatment? yes Duration of hypertension: chronic BP monitoring frequency: a few times a week BP range: 120-130/80's BP medication side effects: no Duration of hyperlipidemia: chronic Cholesterol medication side effects: no Cholesterol supplements: none Medication compliance: good compliance Aspirin: no Recent stressors: no Recurrent headaches: no Visual changes: no Palpitations: no Dyspnea: no Chest pain: no Lower extremity edema: no Dizzy/lightheaded: no  The ASCVD  Risk score (Arnett DK, et al., 2019) failed to calculate for the following reasons:   The valid total cholesterol range is 130 to 320 mg/dL  CHRONIC KIDNEY DISEASE October improved labs GFR 52 and CRT 1.38 CKD status: stable Medications renally dose: yes Previous renal evaluation: no Pneumovax:  Up to Date Influenza Vaccine:  Up to Date   ANEMIA Ongoing since surgery in March 2021 -- recent labs 12/01/20 noted ongoing low H/H, but low normal iron and GI recommended if ongoing to refer to hematology.  Currently is taking Vancomycin per GI.  Takes B12 daily with recent level on low normal side.   Anemia status: stable Etiology of anemia: post surgical Duration of anemia treatment:  Compliance with treatment: good compliance Iron supplementation side effects: no Severity of anemia: mild Fatigue:  yes Decreased exercise tolerance: no  Dyspnea on exertion: no Palpitations: no Bleeding: no Pica: no   CHRONIC PAIN Had thoracic spinal cord stimulator placed initially on 07/01/2019 and then permanent placement on 07/07/2019.  Saw Dr. Izora Ribas on 12/02/20 -- they recommend no further surgery.  Has Gabapentin for neuropathy -- has done nerve testing in past per patient report -- he reports this is not working and he does not take.  Has ongoing weakness with the neuropathy -- has done physical therapy, Dr. Izora Ribas recommended he start this again and patient reports wrote order for this.  Was being followed by urology post surgery for issues post back surgery with urination -- continues Tamsulosin and Finasteride. Duration:  weeks Lower extremity edema: no Arthralgias:no Myalgias: no Weakness: no Rash: no  Relevant past medical, surgical, family and social history reviewed and updated as indicated. Interim medical history since our last visit reviewed. Allergies and medications reviewed and updated.  Review of Systems  Constitutional:  Negative for activity change, appetite change, diaphoresis, fatigue and fever.  Respiratory:  Negative for cough, chest tightness, shortness of breath and wheezing.   Cardiovascular:  Negative for chest pain, palpitations and leg swelling.  Gastrointestinal: Negative.   Endocrine: Negative for cold intolerance, heat intolerance, polydipsia, polyphagia and polyuria.  Musculoskeletal:  Positive for arthralgias.  Neurological: Negative.   Psychiatric/Behavioral: Negative.     Per HPI unless specifically indicated above     Objective:    BP 132/80 (BP Location: Left Arm)   Pulse 71   Temp 98 F (36.7 C) (Oral)   Wt 191 lb 12.8 oz (87 kg)   SpO2 98%   BMI 25.30 kg/m   Wt Readings from Last 3 Encounters:  12/10/20 191 lb 12.8 oz (87 kg)  12/01/20 190 lb (86.2 kg)  11/10/20 193 lb (87.5 kg)    Physical Exam Vitals and nursing note reviewed.   Constitutional:      General: He is awake. He is not in acute distress.    Appearance: He is well-developed and well-groomed. He is not ill-appearing.  HENT:     Head: Normocephalic and atraumatic.     Right Ear: Hearing normal. No drainage.     Left Ear: Hearing normal. No drainage.  Eyes:     General: Lids are normal.        Right eye: No discharge.        Left eye: No discharge.     Conjunctiva/sclera: Conjunctivae normal.     Pupils: Pupils are equal, round, and reactive to light.  Neck:     Thyroid: No thyromegaly.     Vascular: No carotid bruit.     Trachea: Trachea  normal.  Cardiovascular:     Rate and Rhythm: Normal rate and regular rhythm.     Heart sounds: Normal heart sounds, S1 normal and S2 normal. No murmur heard.   No gallop.  Pulmonary:     Effort: Pulmonary effort is normal. No accessory muscle usage or respiratory distress.     Breath sounds: Normal breath sounds.  Abdominal:     General: Bowel sounds are normal. There is no distension.     Palpations: Abdomen is soft. There is no hepatomegaly.     Tenderness: There is no abdominal tenderness. There is no right CVA tenderness or left CVA tenderness.  Musculoskeletal:        General: Normal range of motion.     Cervical back: Normal range of motion and neck supple.     Right lower leg: No edema.     Left lower leg: No edema.  Lymphadenopathy:     Cervical: No cervical adenopathy.  Skin:    General: Skin is warm and dry.     Capillary Refill: Capillary refill takes less than 2 seconds.  Neurological:     Mental Status: He is alert and oriented to person, place, and time.     Deep Tendon Reflexes:     Reflex Scores:      Patellar reflexes are 1+ on the right side and 1+ on the left side.      Achilles reflexes are 1+ on the right side and 1+ on the left side.    Comments: Antalgic gait, using cane.  Psychiatric:        Attention and Perception: Attention normal.        Mood and Affect: Mood normal.         Speech: Speech normal.        Behavior: Behavior normal. Behavior is cooperative.        Thought Content: Thought content normal.   Results for orders placed or performed in visit on 12/01/20  CBC w/Diff  Result Value Ref Range   WBC 6.5 3.4 - 10.8 x10E3/uL   RBC 4.05 (L) 4.14 - 5.80 x10E6/uL   Hemoglobin 12.2 (L) 13.0 - 17.7 g/dL   Hematocrit 37.1 (L) 37.5 - 51.0 %   MCV 92 79 - 97 fL   MCH 30.1 26.6 - 33.0 pg   MCHC 32.9 31.5 - 35.7 g/dL   RDW 13.4 11.6 - 15.4 %   Platelets 162 150 - 450 x10E3/uL   Neutrophils 55 Not Estab. %   Lymphs 32 Not Estab. %   Monocytes 12 Not Estab. %   Eos 1 Not Estab. %   Basos 0 Not Estab. %   Neutrophils Absolute 3.7 1.4 - 7.0 x10E3/uL   Lymphocytes Absolute 2.1 0.7 - 3.1 x10E3/uL   Monocytes Absolute 0.8 0.1 - 0.9 x10E3/uL   EOS (ABSOLUTE) 0.0 0.0 - 0.4 x10E3/uL   Basophils Absolute 0.0 0.0 - 0.2 x10E3/uL   Immature Granulocytes 0 Not Estab. %   Immature Grans (Abs) 0.0 0.0 - 0.1 x10E3/uL  B12  Result Value Ref Range   Vitamin B-12 427 232 - 1,245 pg/mL  Iron, TIBC and Ferritin Panel  Result Value Ref Range   Total Iron Binding Capacity 328 250 - 450 ug/dL   UIBC 280 111 - 343 ug/dL   Iron 48 38 - 169 ug/dL   Iron Saturation 15 15 - 55 %   Ferritin 77 30 - 400 ng/mL      Assessment & Plan:  Problem List Items Addressed This Visit       Cardiovascular and Mediastinum   Hypertension associated with diabetes ([place])    Chronic, stable with BP at goal for age in office and on home readings.  A1C 6.5% today. Due to diarrhea concerns will reduce Metformin XR to 500 MG BID to see if benefit and continue collaboration with cardiology.  Recommend he continue to check BP at home at least 3 mornings a week and document.  BMP today.  BP is stable with current regimen, recommend we keep ACE on board for kidney protection in his diabetes -- he wishes to continue to take as needed only.       Relevant Medications   benazepril (LOTENSIN) 5 MG  tablet   metFORMIN (GLUCOPHAGE-XR) 500 MG 24 hr tablet   Other Relevant Orders   Basic metabolic panel   Bayer DCA Hb A1c Waived   Paroxysmal atrial fibrillation (HCC)    Chronic, ongoing.  Continue current medication regimen and collaboration with cardiology.  Monitor CBC closely due to anemia.      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  Decreased sensation bilateral feet, monitor closely for wounds and falls.  Due to diarrhea concerns will reduce Metformin XR to 500 MG BID to see if benefit.        Relevant Medications   benazepril (LOTENSIN) 5 MG tablet   metFORMIN (GLUCOPHAGE-XR) 500 MG 24 hr tablet   Type 2 diabetes mellitus with diabetic chronic kidney disease (HCC)    Chronic, ongoing with A1C 6.5% today.  Continue Benazepril for kidney protection (he continues to maintain this on as needed basis only).  Due to diarrhea concerns will reduce Metformin XR to 500 MG BID to see if benefit.  Educated him on kidney disease and monitoring.  Consider referral to nephrology in future if ever any worsening of function noted -- recent labs stable.  BMP today.      Relevant Medications   benazepril (LOTENSIN) 5 MG tablet   metFORMIN (GLUCOPHAGE-XR) 500 MG 24 hr tablet   Other Relevant Orders   Basic metabolic panel   Bayer DCA Hb A1c Waived   Hyperlipidemia associated with type 2 diabetes mellitus (HCC)    Chronic, ongoing.  Continue current medication regimen, tolerating 5 MG Crestor well without ADR, and adjust as needed.  Lipid panel up to date.        Relevant Medications   benazepril (LOTENSIN) 5 MG tablet   metFORMIN (GLUCOPHAGE-XR) 500 MG 24 hr tablet   Other Relevant Orders   Bayer DCA Hb A1c Waived     Genitourinary   CKD (chronic kidney disease) stage 3, GFR 30-59 ml/min (HCC)    Chronic, ongoing.  Continue to monitor closely and refer  to nephrology if decline -- recent labs stable.  Continue Benazepril for kidney protection (which he wishes to take as needed only).  Renal dose medications as needed based on labs.  BMP today.        Hematopoietic and Hemostatic   Acquired thrombophilia ([place])    Patient on Eliquis with A-fib, monitor CBC regularly.        Other   Anemia    Onoging issue with recent GI assessment performed, they recommended hematology if ongoing.  Patient on Eliquis.  Referral to hematology placed today for further assessment.  Relevant Orders   Ambulatory referral to Hematology / Oncology   Spinal cord stimulator status    Continue collaboration with pain management.      Chronic pain syndrome    Chronic, ongoing.  Followed by neurosurgery and pain management as needed.  Continue this collaboration and current regimen.        Follow up plan: Return in about 4 weeks (around 01/07/2021) for Anemia.

## 2020-12-10 NOTE — Assessment & Plan Note (Signed)
Onoging issue with recent GI assessment performed, they recommended hematology if ongoing.  Patient on Eliquis.  Referral to hematology placed today for further assessment.

## 2020-12-10 NOTE — Assessment & Plan Note (Signed)
Chronic, ongoing.  Followed by neurosurgery and pain management as needed.  Continue this collaboration and current regimen. 

## 2020-12-10 NOTE — Patient Instructions (Signed)
Anemia Anemia is a condition in which there is not enough red blood cells or hemoglobin in the blood. Hemoglobin is a substance in red blood cells that carries oxygen. When you do not have enough red blood cells or hemoglobin (are anemic), your body cannot get enough oxygen and your organs may not work properly. As a result, you may feel very tired or have other problems. What are the causes? Common causes of anemia include: Excessive bleeding. Anemia can be caused by excessive bleeding inside or outside the body, including bleeding from the intestines or from heavy menstrual periods in females. Poor nutrition. Long-lasting (chronic) kidney, thyroid, and liver disease. Bone marrow disorders, spleen problems, and blood disorders. Cancer and treatments for cancer. HIV (human immunodeficiency virus) and AIDS (acquired immunodeficiency syndrome). Infections, medicines, and autoimmune disorders that destroy red blood cells. What are the signs or symptoms? Symptoms of this condition include: Minor weakness. Dizziness. Headache, or difficulties concentrating and sleeping. Heartbeats that feel irregular or faster than normal (palpitations). Shortness of breath, especially with exercise. Pale skin, lips, and nails, or cold hands and feet. Indigestion and nausea. Symptoms may occur suddenly or develop slowly. If your anemia is mild, you may not have symptoms. How is this diagnosed? This condition is diagnosed based on blood tests, your medical history, and a physical exam. In some cases, a test may be needed in which cells are removed from the soft tissue inside of a bone and looked at under a microscope (bone marrow biopsy). Your health care provider may also check your stool (feces) for blood and may do additional testing to look for the cause of your bleeding. Other tests may include: Imaging tests, such as a CT scan or MRI. A procedure to see inside your esophagus and stomach (endoscopy). A  procedure to see inside your colon and rectum (colonoscopy). How is this treated? Treatment for this condition depends on the cause. If you continue to lose a lot of blood, you may need to be treated at a hospital. Treatment may include: Taking supplements of iron, vitamin B12, or folic acid. Taking a hormone medicine (erythropoietin) that can help to stimulate red blood cell growth. Having a blood transfusion. This may be needed if you lose a lot of blood. Making changes to your diet. Having surgery to remove your spleen. Follow these instructions at home: Take over-the-counter and prescription medicines only as told by your health care provider. Take supplements only as told by your health care provider. Follow any diet instructions that you were given by your health care provider. Keep all follow-up visits as told by your health care provider. This is important. Contact a health care provider if: You develop new bleeding anywhere in the body. Get help right away if: You are very weak. You are short of breath. You have pain in your abdomen or chest. You are dizzy or feel faint. You have trouble concentrating. You have bloody stools, black stools, or tarry stools. You vomit repeatedly or you vomit up blood. These symptoms may represent a serious problem that is an emergency. Do not wait to see if the symptoms will go away. Get medical help right away. Call your local emergency services (911 in the [place] Do not drive yourself to the hospital. Summary Anemia is a condition in which you do not have enough red blood cells or enough of a substance in your red blood cells that carries oxygen (hemoglobin). Symptoms may occur suddenly or develop slowly. If your anemia is   mild, you may not have symptoms. This condition is diagnosed with blood tests, a medical history, and a physical exam. Other tests may be needed. Treatment for this condition depends on the cause of the anemia. This  information is not intended to replace advice given to you by your health care provider. Make sure you discuss any questions you have with your health care provider. Document Revised: 01/07/2019 Document Reviewed: 01/07/2019 Elsevier Patient Education  2022 [employer]vier Inc.  

## 2020-12-10 NOTE — Assessment & Plan Note (Addendum)
Chronic, ongoing.  Continue current medication regimen, tolerating 5 MG Crestor well without ADR, and adjust as needed.  Lipid panel up to date. 

## 2020-12-10 NOTE — Assessment & Plan Note (Signed)
Patient on Eliquis with A-fib, monitor CBC regularly. 

## 2020-12-10 NOTE — Assessment & Plan Note (Signed)
Chronic, ongoing.  Continue to monitor closely and refer to nephrology if decline -- recent labs stable.  Continue Benazepril for kidney protection (which he wishes to take as needed only).  Renal dose medications as needed based on labs.  BMP today.

## 2020-12-10 NOTE — Assessment & Plan Note (Addendum)
Chronic, ongoing with A1c 6.5% today.  Decreased sensation bilateral feet, monitor closely for wounds and falls.  Due to diarrhea concerns will reduce Metformin XR to 500 MG BID to see if benefit.

## 2020-12-10 NOTE — Assessment & Plan Note (Addendum)
Chronic, stable with BP at goal for age in office and on home readings.  A1C 6.5% today. Due to diarrhea concerns will reduce Metformin XR to 500 MG BID to see if benefit and continue collaboration with cardiology.  Recommend he continue to check BP at home at least 3 mornings a week and document.  BMP today.  BP is stable with current regimen, recommend we keep ACE on board for kidney protection in his diabetes -- he wishes to continue to take as needed only.

## 2020-12-10 NOTE — Assessment & Plan Note (Signed)
Continue collaboration with pain management. 

## 2020-12-11 LAB — BASIC METABOLIC PANEL
BUN/Creatinine Ratio: 13 (ref 10–24)
BUN: 18 mg/dL (ref 8–27)
CO2: 23 mmol/L (ref 20–29)
Calcium: 9.2 mg/dL (ref 8.6–10.2)
Chloride: 100 mmol/L (ref 96–106)
Creatinine, Ser: 1.38 mg/dL — ABNORMAL HIGH (ref 0.76–1.27)
Glucose: 212 mg/dL — ABNORMAL HIGH (ref 70–99)
Potassium: 4.5 mmol/L (ref 3.5–5.2)
Sodium: 136 mmol/L (ref 134–144)
eGFR: 52 mL/min/{1.73_m2} — ABNORMAL LOW (ref >59)

## 2020-12-12 NOTE — Progress Notes (Signed)
Contacted via [facility] morning Erikson, your kidney function labs have returned and continue to show stable chronic kidney disease stage 3a with no worsening.  We will continue to monitor this closely.  Continue all current medications.  Any questions? Keep being amazing!!  Thank you for allowing me to participate in your care.  I appreciate you. Kindest regards, Tawny Raspberry

## 2020-12-14 DIAGNOSIS — I219 Acute myocardial infarction, unspecified: Secondary | ICD-10-CM

## 2020-12-14 HISTORY — DX: Acute myocardial infarction, unspecified: I21.9

## 2020-12-20 ENCOUNTER — Inpatient Hospital Stay
Admission: EM | Admit: 2020-12-20 | Discharge: 2020-12-23 | DRG: 247 | Disposition: A | Discharge status: Home / Self Care | Payer: Medicare Other | Attending: Internal Medicine | Admitting: Internal Medicine

## 2020-12-20 ENCOUNTER — Other Ambulatory Visit: Payer: Self-pay

## 2020-12-20 ENCOUNTER — Ambulatory Visit: Payer: Medicare Other

## 2020-12-20 ENCOUNTER — Emergency Department: Payer: Medicare Other

## 2020-12-20 DIAGNOSIS — N401 Enlarged prostate with lower urinary tract symptoms: Secondary | ICD-10-CM | POA: Diagnosis present

## 2020-12-20 DIAGNOSIS — D631 Anemia in chronic kidney disease: Secondary | ICD-10-CM | POA: Diagnosis present

## 2020-12-20 DIAGNOSIS — I208 Other forms of angina pectoris: Secondary | ICD-10-CM | POA: Diagnosis not present

## 2020-12-20 DIAGNOSIS — I25110 Atherosclerotic heart disease of native coronary artery with unstable angina pectoris: Secondary | ICD-10-CM | POA: Diagnosis present

## 2020-12-20 DIAGNOSIS — E1122 Type 2 diabetes mellitus with diabetic chronic kidney disease: Secondary | ICD-10-CM | POA: Diagnosis present

## 2020-12-20 DIAGNOSIS — N179 Acute kidney failure, unspecified: Secondary | ICD-10-CM | POA: Diagnosis not present

## 2020-12-20 DIAGNOSIS — I480 Paroxysmal atrial fibrillation: Secondary | ICD-10-CM | POA: Diagnosis not present

## 2020-12-20 DIAGNOSIS — Z8616 Personal history of COVID-19: Secondary | ICD-10-CM | POA: Diagnosis not present

## 2020-12-20 DIAGNOSIS — Z981 Arthrodesis status: Secondary | ICD-10-CM

## 2020-12-20 DIAGNOSIS — R0789 Other chest pain: Secondary | ICD-10-CM | POA: Diagnosis not present

## 2020-12-20 DIAGNOSIS — Z808 Family history of malignant neoplasm of other organs or systems: Secondary | ICD-10-CM | POA: Diagnosis not present

## 2020-12-20 DIAGNOSIS — E785 Hyperlipidemia, unspecified: Secondary | ICD-10-CM | POA: Diagnosis not present

## 2020-12-20 DIAGNOSIS — Z79899 Other long term (current) drug therapy: Secondary | ICD-10-CM

## 2020-12-20 DIAGNOSIS — E1151 Type 2 diabetes mellitus with diabetic peripheral angiopathy without gangrene: Secondary | ICD-10-CM | POA: Diagnosis not present

## 2020-12-20 DIAGNOSIS — A0472 Enterocolitis due to Clostridium difficile, not specified as recurrent: Secondary | ICD-10-CM | POA: Diagnosis not present

## 2020-12-20 DIAGNOSIS — N138 Other obstructive and reflux uropathy: Secondary | ICD-10-CM | POA: Diagnosis present

## 2020-12-20 DIAGNOSIS — M47812 Spondylosis without myelopathy or radiculopathy, cervical region: Secondary | ICD-10-CM | POA: Diagnosis present

## 2020-12-20 DIAGNOSIS — D649 Anemia, unspecified: Secondary | ICD-10-CM | POA: Diagnosis present

## 2020-12-20 DIAGNOSIS — I10 Essential (primary) hypertension: Secondary | ICD-10-CM | POA: Diagnosis not present

## 2020-12-20 DIAGNOSIS — G4733 Obstructive sleep apnea (adult) (pediatric): Secondary | ICD-10-CM | POA: Diagnosis not present

## 2020-12-20 DIAGNOSIS — E440 Moderate protein-calorie malnutrition: Secondary | ICD-10-CM | POA: Diagnosis not present

## 2020-12-20 DIAGNOSIS — Z974 Presence of external hearing-aid: Secondary | ICD-10-CM

## 2020-12-20 DIAGNOSIS — N1831 Chronic kidney disease, stage 3a: Secondary | ICD-10-CM | POA: Diagnosis present

## 2020-12-20 DIAGNOSIS — E1143 Type 2 diabetes mellitus with diabetic autonomic (poly)neuropathy: Secondary | ICD-10-CM | POA: Diagnosis present

## 2020-12-20 DIAGNOSIS — D6859 Other primary thrombophilia: Secondary | ICD-10-CM | POA: Diagnosis not present

## 2020-12-20 DIAGNOSIS — E782 Mixed hyperlipidemia: Secondary | ICD-10-CM | POA: Diagnosis not present

## 2020-12-20 DIAGNOSIS — D6869 Other thrombophilia: Secondary | ICD-10-CM | POA: Diagnosis present

## 2020-12-20 DIAGNOSIS — Z888 Allergy status to other drugs, medicaments and biological substances status: Secondary | ICD-10-CM

## 2020-12-20 DIAGNOSIS — K219 Gastro-esophageal reflux disease without esophagitis: Secondary | ICD-10-CM | POA: Diagnosis not present

## 2020-12-20 DIAGNOSIS — G894 Chronic pain syndrome: Secondary | ICD-10-CM | POA: Diagnosis present

## 2020-12-20 DIAGNOSIS — Z96643 Presence of artificial hip joint, bilateral: Secondary | ICD-10-CM | POA: Diagnosis present

## 2020-12-20 DIAGNOSIS — R079 Chest pain, unspecified: Secondary | ICD-10-CM

## 2020-12-20 DIAGNOSIS — N183 Chronic kidney disease, stage 3 unspecified: Secondary | ICD-10-CM | POA: Diagnosis present

## 2020-12-20 DIAGNOSIS — I2109 ST elevation (STEMI) myocardial infarction involving other coronary artery of anterior wall: Secondary | ICD-10-CM | POA: Diagnosis not present

## 2020-12-20 DIAGNOSIS — N189 Chronic kidney disease, unspecified: Secondary | ICD-10-CM | POA: Diagnosis not present

## 2020-12-20 DIAGNOSIS — Z7984 Long term (current) use of oral hypoglycemic drugs: Secondary | ICD-10-CM

## 2020-12-20 DIAGNOSIS — E1169 Type 2 diabetes mellitus with other specified complication: Secondary | ICD-10-CM | POA: Diagnosis present

## 2020-12-20 DIAGNOSIS — I214 Non-ST elevation (NSTEMI) myocardial infarction: Principal | ICD-10-CM | POA: Diagnosis present

## 2020-12-20 DIAGNOSIS — Z7901 Long term (current) use of anticoagulants: Secondary | ICD-10-CM

## 2020-12-20 DIAGNOSIS — D509 Iron deficiency anemia, unspecified: Secondary | ICD-10-CM | POA: Diagnosis present

## 2020-12-20 DIAGNOSIS — Z881 Allergy status to other antibiotic agents status: Secondary | ICD-10-CM

## 2020-12-20 DIAGNOSIS — I252 Old myocardial infarction: Secondary | ICD-10-CM | POA: Diagnosis present

## 2020-12-20 DIAGNOSIS — I739 Peripheral vascular disease, unspecified: Secondary | ICD-10-CM | POA: Diagnosis present

## 2020-12-20 DIAGNOSIS — I200 Unstable angina: Secondary | ICD-10-CM | POA: Diagnosis present

## 2020-12-20 DIAGNOSIS — N400 Enlarged prostate without lower urinary tract symptoms: Secondary | ICD-10-CM

## 2020-12-20 DIAGNOSIS — Z9861 Coronary angioplasty status: Secondary | ICD-10-CM | POA: Diagnosis not present

## 2020-12-20 DIAGNOSIS — I152 Hypertension secondary to endocrine disorders: Secondary | ICD-10-CM | POA: Diagnosis present

## 2020-12-20 DIAGNOSIS — Z91013 Allergy to seafood: Secondary | ICD-10-CM

## 2020-12-20 DIAGNOSIS — I219 Acute myocardial infarction, unspecified: Secondary | ICD-10-CM | POA: Diagnosis not present

## 2020-12-20 LAB — CBC
HCT: 33.3 % — ABNORMAL LOW (ref 39.0–52.0)
Hemoglobin: 11.1 g/dL — ABNORMAL LOW (ref 13.0–17.0)
MCH: 30.4 pg (ref 26.0–34.0)
MCHC: 33.3 g/dL (ref 30.0–36.0)
MCV: 91.2 fL (ref 80.0–100.0)
Platelets: 151 10*3/uL (ref 150–400)
RBC: 3.65 MIL/uL — ABNORMAL LOW (ref 4.22–5.81)
RDW: 13.1 % (ref 11.5–15.5)
WBC: 6 10*3/uL (ref 4.0–10.5)
nRBC: 0 % (ref 0.0–0.2)

## 2020-12-20 LAB — BASIC METABOLIC PANEL
Anion gap: 7 (ref 5–15)
BUN: 27 mg/dL — ABNORMAL HIGH (ref 8–23)
CO2: 27 mmol/L (ref 22–32)
Calcium: 9.7 mg/dL (ref 8.9–10.3)
Chloride: 101 mmol/L (ref 98–111)
Creatinine, Ser: 1.68 mg/dL — ABNORMAL HIGH (ref 0.61–1.24)
GFR, Estimated: 41 mL/min — ABNORMAL LOW (ref >60)
Glucose, Bld: 206 mg/dL — ABNORMAL HIGH (ref 70–99)
Potassium: 4.1 mmol/L (ref 3.5–5.1)
Sodium: 135 mmol/L (ref 135–145)

## 2020-12-20 LAB — RESP PANEL BY RT-PCR (FLU A&B, COVID) ARPGX2
Influenza A by PCR: NEGATIVE
Influenza B by PCR: NEGATIVE
SARS Coronavirus 2 by RT PCR: NEGATIVE

## 2020-12-20 LAB — TROPONIN I (HIGH SENSITIVITY): Troponin I (High Sensitivity): 18 ng/L — ABNORMAL HIGH (ref <18)

## 2020-12-20 MED ORDER — NITROGLYCERIN 0.4 MG SL SUBL
0.4000 mg | SUBLINGUAL_TABLET | SUBLINGUAL | Status: DC | PRN
  Administered 2020-12-20: 0.4 mg via SUBLINGUAL
  Filled 2020-12-20: qty 1

## 2020-12-20 MED ORDER — ASPIRIN 81 MG PO CHEW
324.0000 mg | CHEWABLE_TABLET | Freq: Once | ORAL | Status: AC
  Administered 2020-12-20: 324 mg via ORAL
  Filled 2020-12-20: qty 4

## 2020-12-20 NOTE — ED Provider Notes (Signed)
[facility] Emergency Department Provider Note    Event Date/Time   First MD Initiated Contact with Patient 12/20/20 2040     (approximate)  I have reviewed the triage vital signs and the nursing notes.   HISTORY  Chief Complaint Chest Pain and Neck Pain    HPI Sean Docter. is a 80 y.o. male below listed past medical history presents to the ER for evaluation of chest pain neck pain that he describes as a pressure.  States that is happened several times with exertion over the past 3 days but today started, more severe.  States that it happened when he was going to the garage.  Last about an hour.  Did also have some achiness in his right arm and into his shoulders.  Denies any symptoms like indigestion nausea or vomiting.  No fevers.  No shortness of breath.  No diaphoresis.  This pain occurred after he had already been seen by his cardiologist this morning.  They are planning outpatient stress and echo.  Due to worsening of the pain he decided come to the ER.  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   Hyperlipidemia    Hypertension    Nocturia    S/P ablation of atrial fibrillation    Ablative therapy   Sleep apnea    CPAP    Spinal stenosis    Tachycardia, unspecified    Family History  Problem Relation Age of Onset   Brain cancer Mother    Kidney disease Neg Hx    Prostate cancer Neg Hx    Kidney cancer Neg Hx    Bladder Cancer Neg Hx    Past Surgical History:  Procedure Laterality Date    ABLATION     ANTERIOR LAT LUMBAR FUSION N/A 06/27/2017   Procedure: Anterior Lateral Lumbar Interbody  Fusion - Lumbar Two-Lumbar Three - Lumbar Three-Lumbar Four, Posterior Lumbar Interbody Fusion Lumbar Four- Five;  Surgeon: Kary Kos, MD;  Location: [place];  Service: Neurosurgery;  Laterality: N/A;  Anterior Lateral Lumbar Interbody  Fusion - Lumbar Two-Lumbar Three - Lumbar Three-Lumbar Four, Posterior Lumbar Interbody Fusion Lumbar Four- Five   BACK SURGERY     CARDIOVERSION N/A 08/29/2018   Procedure: CARDIOVERSION;  Surgeon: Corey Skains, MD;  Location: [facility] ORS;  Service: Cardiovascular;  Laterality: N/A;   CARDIOVERSION N/A 09/24/2018   Procedure: CARDIOVERSION;  Surgeon: Corey Skains, MD;  Location: [facility] ORS;  Service: Cardiovascular;  Laterality: N/A;   COLONOSCOPY WITH PROPOFOL N/A 10/05/2015   Procedure: COLONOSCOPY WITH PROPOFOL;  Surgeon: Lollie Sails, MD;  Location: [facility] ENDOSCOPY;  Service: Endoscopy;  Laterality: N/A;   COLONOSCOPY WITH PROPOFOL N/A 11/01/2020   Procedure: COLONOSCOPY WITH PROPOFOL;  Surgeon: Jonathon Bellows, MD;  Location: [facility] ENDOSCOPY;  Service: Gastroenterology;  Laterality: N/A;   ESOPHAGOGASTRODUODENOSCOPY N/A 11/01/2020   Procedure: ESOPHAGOGASTRODUODENOSCOPY (  EGD);  Surgeon: Jonathon Bellows, MD;  Location: [facility] ENDOSCOPY;  Service: Gastroenterology;  Laterality: N/A;   ESOPHAGOGASTRODUODENOSCOPY (EGD) WITH PROPOFOL N/A 04/01/2018   Procedure: ESOPHAGOGASTRODUODENOSCOPY (EGD) WITH PROPOFOL;  Surgeon: Lollie Sails, MD;  Location: [facility] ENDOSCOPY;  Service: Endoscopy;  Laterality: N/A;   HERNIA REPAIR     JOINT REPLACEMENT Bilateral    hips  RT+  LEFT X2    LUMBAR LAMINECTOMY/DECOMPRESSION MICRODISCECTOMY Left 09/13/2016   Procedure: Microdiscectomy - Lumbar two-three,  Lumbar three- - left;  Surgeon: Kary Kos, MD;  Location: [facility];  Service: Neurosurgery;  Laterality: Left;   SPINAL CORD STIMULATOR INSERTION  07/08/2019   TONSILLECTOMY      Patient Active Problem List   Diagnosis Date Noted   Pain in right shin 10/25/2020   Peripheral vascular disease ([place]) 08/06/2020   Chronic pain syndrome 07/06/2020   Cervical facet joint syndrome 04/08/2020   Cervicalgia 04/08/2020   Spinal cord stimulator status 12/11/2019   History of 2019 novel coronavirus disease (COVID-19) 10/02/2019   CKD (chronic kidney disease) stage 3, GFR 30-59 ml/min ([place]) 01/19/2019   Acquired thrombophilia ([place]) 01/19/2019   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   Postlaminectomy syndrome, lumbar region 01/16/2019   History of fusion of lumbar spine (L2-L5) 01/16/2019   Chronic radicular lumbar pain 01/16/2019   HNP (herniated nucleus pulposus), lumbar 04/29/2018   Advanced care planning/counseling discussion 09/28/2016   Spinal stenosis, lumbar region, 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/13/2016   Hyperlipidemia associated with type 2 diabetes mellitus ([place]) 07/14/2015   Anemia 06/30/2015   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 Authorizing Provider  acetaminophen (TYLENOL) 500 MG tablet Take 500 mg by mouth every 6 (six) hours as needed (pain).    [provider]  benazepril (LOTENSIN) 5 MG tablet Take by mouth.    [provider]  ELIQUIS 5 MG TABS tablet Take 1 tablet (5 mg total) by mouth 2 (two) times daily. 09/28/20   [name], Henrine Screws T, NP  ferrous sulfate 325 (65 FE) MG tablet Take by mouth.    [provider]  finasteride (PROSCAR) 5 MG tablet Take 1 tablet (5 mg total) by mouth  daily. 12/06/20   [name], Henrine Screws T, NP  gabapentin (NEURONTIN) 300 MG capsule Take 1 capsule (300 mg total) by mouth 2 (two) times daily. 11/25/19   [name], Henrine Screws T, NP  metFORMIN (GLUCOPHAGE-XR) 500 MG 24 hr tablet Take 1 tablet (500 mg total) by mouth 2 (two) times daily. 12/10/20   [name], Henrine Screws T, NP  metoprolol succinate (TOPROL-XL) 25 MG 24 hr tablet Take 1 tablet by mouth daily. 03/31/19   [provider]  pantoprazole (PROTONIX) 40 MG tablet Take by mouth. 10/21/20   [provider]  polyethylene glycol-electrolytes (NULYTELY) 420 g solution Prepare according to package instructions. Starting at 5:00 PM: Drink one 8 oz glass of mixture every 15 minutes until you finish half of the jug. Five hours prior to procedure, drink 8 oz glass of mixture every 15 minutes until it is all gone. Make sure you do not drink anything 4 hours prior to  your procedure. 09/29/20   Jonathon Bellows, MD  propafenone (RYTHMOL SR) 425 MG 12 hr capsule Take 425 mg by mouth 2 (two) times daily.    [provider]  rosuvastatin (CRESTOR) 5 MG tablet Take 1 tablet (5 mg total) by mouth daily. 12/06/20   [name], Henrine Screws T, NP  tamsulosin (FLOMAX) 0.4 MG CAPS capsule Take 0.8 mg by mouth daily. 07/18/19   [provider]  trimethoprim-polymyxin b (POLYTRIM) ophthalmic solution Apply to eye. 07/14/20   [provider]  vancomycin (VANCOCIN) 125 MG capsule Take 1 capsule (125 mg total) by mouth 4 (four) times daily for 14 days, THEN 1 capsule (125 mg total) in the morning, at noon, and at bedtime for 21 days, THEN 1 capsule (125 mg total) in the morning and at bedtime for 14 days, THEN 1 capsule (125 mg total) daily for 7 days. 11/17/20 01/12/21  Jonathon Bellows, MD    Allergies Levaquin [levofloxacin in d5w], Shellfish allergy, Amiodarone, and Adhesive [tape]    Social History Social History   Tobacco Use   Smoking status: Never   Smokeless tobacco: Never  Vaping Use   Vaping Use:  Never used  Substance Use Topics   Alcohol use: No   Drug use: No    Review of Systems Patient denies headaches, rhinorrhea, blurry vision, numbness, shortness of breath, chest pain, edema, cough, abdominal pain, nausea, vomiting, diarrhea, dysuria, fevers, rashes or hallucinations unless otherwise stated above in HPI. ____________________________________________   PHYSICAL EXAM:  VITAL SIGNS: Vitals:   12/20/20 2012  BP: (!) 175/88  Pulse: 72  Resp: 18  Temp: 98.6 F (37 C)  SpO2: 97%    Constitutional: Alert and oriented.  Eyes: Conjunctivae are normal.  Head: Atraumatic. Nose: No congestion/rhinnorhea. Mouth/Throat: Mucous membranes are moist.   Neck: No stridor. Painless ROM.  Cardiovascular: Normal rate, regular rhythm. Grossly normal heart sounds.  Good peripheral circulation. Respiratory: Normal respiratory effort.  No retractions. Lungs CTAB. Gastrointestinal: Soft and nontender. No distention. No abdominal bruits. No CVA tenderness. Genitourinary:  Musculoskeletal: No lower extremity tenderness nor edema.  No joint effusions. Neurologic:  Normal speech and language. No gross focal neurologic deficits are appreciated. No facial droop Skin:  Skin is warm, dry and intact. No rash noted. Psychiatric: Mood and affect are normal. Speech and behavior are normal.  ____________________________________________   LABS (all labs ordered are listed, but only abnormal results are displayed)  Results for orders placed or performed during the hospital encounter of 12/20/20 (from the past 24 hour(s))  Basic metabolic panel     Status: Abnormal   Collection Time: 12/20/20  8:11 PM  Result Value Ref Range   Sodium 135 135 - 145 mmol/L   Potassium 4.1 3.5 - 5.1 mmol/L   Chloride 101 98 - 111 mmol/L   CO2 27 22 - 32 mmol/L   Glucose, Bld 206 (H) 70 - 99 mg/dL   BUN 27 (H) 8 - 23 mg/dL   Creatinine, Ser 1.68 (H) 0.61 - 1.24 mg/dL   Calcium 9.7 8.9 - 10.3 mg/dL   GFR,  Estimated 41 (L) >60 mL/min   Anion gap 7 5 - 15  CBC     Status: Abnormal   Collection Time: 12/20/20  8:11 PM  Result Value Ref Range   WBC 6.0 4.0 - 10.5 K/uL   RBC 3.65 (L) 4.22 - 5.81 MIL/uL   Hemoglobin 11.1 (L) 13.0 - 17.0 g/dL   HCT 33.3 (L) 39.0 - 52.0 %  MCV 91.2 80.0 - 100.0 fL   MCH 30.4 26.0 - 34.0 pg   MCHC 33.3 30.0 - 36.0 g/dL   RDW 13.1 11.5 - 15.5 %   Platelets 151 150 - 400 K/uL   nRBC 0.0 0.0 - 0.2 %  Troponin I (High Sensitivity)     Status: Abnormal   Collection Time: 12/20/20  8:11 PM  Result Value Ref Range   Troponin I (High Sensitivity) 18 (H) <18 ng/L   ____________________________________________  EKG My review and personal interpretation at Time: 11:07   Indication: chest pain  Rate: 70  Rhythm: sinus Axis: normal Other: normal intervals, no stemi, inferolateral twave inversions and depressions as compared to previous, no stemi ____________________________________________  RADIOLOGY  I personally reviewed all radiographic images ordered to evaluate for the above acute complaints and reviewed radiology reports and findings.  These findings were personally discussed with the patient.  Please see medical record for radiology report.  ____________________________________________   PROCEDURES  Procedure(s) performed:  Procedures    Critical Care performed: no ____________________________________________   INITIAL IMPRESSION / ASSESSMENT AND PLAN / ED COURSE  Pertinent labs & imaging results that were available during my care of the patient were reviewed by me and considered in my medical decision making (see chart for details).   DDX: ACS, pericarditis, esophagitis, boerhaaves, pe, dissection, pna, bronchitis, costochondritis   Sean Reyes. is a 80 y.o. who presents to the ED with symptoms as described above.  Patient arrives is currently pain-free but describing concerning symptoms for angina and the review of his EKG does show some  inferolateral T wave inversions and depressions as compared to previous.  No STEMI criteria is initial troponin mildly elevated.  He is already on Eliquis therefore do not feel that heparin is clinically indicated at this time will give aspirin.  The lower suspicion for PE as he is on Eliquis.  Does not seem consistent with dissection given the intermittent and exertional components.  His abdominal exam is soft and benign.  Will give aspirin.  Have ordered nitro for blood pressure and antianginal component.  Based on his age risk factors and presenting complaints do feel that admission to hospital indicated as he is high risk chest pain.     The patient was evaluated in Emergency Department today for the symptoms described in the history of present illness. He/she was evaluated in the context of the global COVID-19 pandemic, which necessitated consideration that the patient might be at risk for infection with the SARS-CoV-2 virus that causes COVID-19. Institutional protocols and algorithms that pertain to the evaluation of patients at risk for COVID-19 are in a state of rapid change based on information released by regulatory bodies including the CDC and federal and state organizations. These policies and algorithms were followed during the patient's care in the ED.  As part of my medical decision making, I reviewed the following data within the [facility] notes reviewed and incorporated, Labs reviewed, notes from prior ED visits and [place] Controlled Substance Database   ____________________________________________   FINAL CLINICAL IMPRESSION(S) / ED DIAGNOSES  Final diagnoses:  Chest pain, unspecified type      NEW MEDICATIONS STARTED DURING THIS VISIT:  New Prescriptions   No medications on file     Note:  This document was prepared using Dragon voice recognition software and may include unintentional dictation errors.    Merlyn Lot, MD 12/20/20 2125

## 2020-12-20 NOTE — ED Triage Notes (Signed)
Pt presents to ER c/o chest pain and left side neck pain that started today.  Pt states he has seen cardiologist today for same issue and was cleared, but today states pain came back even worse.  Pt has hx of afib, no MI.  Pt A&O x4 at this time.  NAD noted.

## 2020-12-20 NOTE — ED Notes (Signed)
Pt sts that nitro is helping with chest pain at this time.

## 2020-12-20 NOTE — H&P (Signed)
History and Physical   Sean Reyes. JEH:[id number] DOB: 1940/12/14 DOA: 12/20/2020  Referring MD/NP/PA: Dr. Quentin Cornwall  PCP: [name], NP   Outpatient Specialists: [facility] cardiology  Patient coming from: Home  Chief Complaint: Chest pain  HPI: Sean Reyes. is a 80 y.o. male with medical history significant of diabetes, paroxysmal A. fib, peripheral vascular disease, anemia of chronic disease, anxiety disorder, osteoarthritis, GERD, essential hypertension, hyperlipidemia, obstructive sleep apnea on CPAP who presented to the ER with chest pain.  Chest pain was left-sided rated a 6 out of 10.  More pressure-like.  Describe radiation to the left arm.  Lasting a few minutes but going and coming back.  Associated with some mild diaphoresis.  Patient has had multiple symptoms in the outpatient setting and was actually seen by his cardiologist in the office.  There was plan for outpatient cardiac stress testing.  He however had return chest pain tonight that was squeezing and forced him to come to the ER.  In the ER he was evaluated and found to have troponin of only 18 for safety.  EKG however showed lateral T wave inversion with some ST depressions.  Patient chest pain was treated with nitroglycerin and morphine and currently resolved.  He is being admitted to the hospital with unstable angina for further treatment..  ED Course: Temperature is 98.6 blood pressure 190/94, pulse 74 respirate of 18 oxygen sats 97% on room air.  White count is 6.0 hemoglobin 11.0, platelets 151.  Sodium 135 potassium 4.1 chloride 101 CO2 27 BUN 27 creatinine 1.69 calcium 9.7.  Troponin of 18.  Chest x-ray showed no acute findings.  Acute respiratory panel currently pending.  EKG showed sinus rhythm with a rate of 70, lateral ST depression, T wave inversion.  Review of Systems: As per HPI otherwise 10 point review of systems negative.    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   Hyperlipidemia    Hypertension    Nocturia    S/P ablation of atrial fibrillation    Ablative therapy   Sleep apnea    CPAP    Spinal stenosis    Tachycardia, unspecified     Past Surgical History:  Procedure Laterality Date   ABLATION     ANTERIOR LAT LUMBAR FUSION N/A 06/27/2017   Procedure: Anterior Lateral Lumbar Interbody  Fusion - Lumbar Two-Lumbar Three - Lumbar Three-Lumbar Four, Posterior Lumbar Interbody Fusion Lumbar Four- Five;  Surgeon: Kary Kos, MD;  Location: [place];  Service: Neurosurgery;  Laterality: N/A;  Anterior Lateral Lumbar Interbody  Fusion - Lumbar Two-Lumbar Three - Lumbar Three-Lumbar Four, Posterior Lumbar Interbody Fusion Lumbar Four- Five   BACK SURGERY     CARDIOVERSION N/A 08/29/2018   Procedure: CARDIOVERSION;  Surgeon: Corey Skains, MD;  Location: [facility] ORS;  Service: Cardiovascular;  Laterality: N/A;   CARDIOVERSION N/A  09/24/2018   Procedure: CARDIOVERSION;  Surgeon: Corey Skains, MD;  Location: [facility] ORS;  Service: Cardiovascular;  Laterality: N/A;   COLONOSCOPY WITH PROPOFOL N/A 10/05/2015   Procedure: COLONOSCOPY WITH PROPOFOL;  Surgeon: Lollie Sails, MD;  Location: [facility] ENDOSCOPY;  Service: Endoscopy;  Laterality: N/A;   COLONOSCOPY WITH PROPOFOL N/A 11/01/2020   Procedure: COLONOSCOPY WITH PROPOFOL;  Surgeon: Jonathon Bellows, MD;  Location: [facility] ENDOSCOPY;  Service: Gastroenterology;  Laterality: N/A;   ESOPHAGOGASTRODUODENOSCOPY N/A 11/01/2020   Procedure: ESOPHAGOGASTRODUODENOSCOPY (EGD);  Surgeon:  Jonathon Bellows, MD;  Location: [facility] ENDOSCOPY;  Service: Gastroenterology;  Laterality: N/A;   ESOPHAGOGASTRODUODENOSCOPY (EGD) WITH PROPOFOL N/A 04/01/2018   Procedure: ESOPHAGOGASTRODUODENOSCOPY (EGD) WITH PROPOFOL;  Surgeon: Lollie Sails, MD;  Location: [facility] ENDOSCOPY;  Service: Endoscopy;  Laterality: N/A;   HERNIA REPAIR     JOINT REPLACEMENT Bilateral    hips  RT+  LEFT X2    LUMBAR LAMINECTOMY/DECOMPRESSION MICRODISCECTOMY Left 09/13/2016   Procedure: Microdiscectomy - Lumbar two-three,  Lumbar three- - left;  Surgeon: Kary Kos, MD;  Location: [place];  Service: Neurosurgery;  Laterality: Left;   SPINAL CORD STIMULATOR INSERTION  07/08/2019   TONSILLECTOMY       reports that he has never smoked. He has never used smokeless tobacco. He reports that he does not drink alcohol and does not use drugs.  Allergies  Allergen Reactions   Levaquin [Levofloxacin In D5w] Anaphylaxis and Shortness Of Breath   Shellfish Allergy Anaphylaxis    Has used duraprep, betadine and ioban in previous surgeries in 2019 and 2018 without issue   Amiodarone Other (See Comments)    Tremors and thyroid toxicity   Adhesive [Tape] Other (See Comments)    Little red bumps under the dressing.  He questions whether is latex related    Family History  Problem Relation Age of Onset   Brain cancer Mother    Kidney disease Neg Hx    Prostate cancer Neg Hx    Kidney cancer Neg Hx    Bladder Cancer Neg Hx      Prior to Admission medications   Medication Sig Start Date End Date Taking? Authorizing Provider  acetaminophen (TYLENOL) 500 MG tablet Take 500 mg by mouth every 6 (six) hours as needed (pain).    [provider]  benazepril (LOTENSIN) 5 MG tablet Take by mouth.    [provider]  ELIQUIS 5 MG TABS tablet Take 1 tablet (5 mg total) by mouth 2 (two) times daily. 09/28/20   [name], Henrine Screws T, NP  ferrous sulfate 325 (65 FE) MG tablet Take by mouth.    [provider]   finasteride (PROSCAR) 5 MG tablet Take 1 tablet (5 mg total) by mouth daily. 12/06/20   [name], Henrine Screws T, NP  gabapentin (NEURONTIN) 300 MG capsule Take 1 capsule (300 mg total) by mouth 2 (two) times daily. 11/25/19   [name], Henrine Screws T, NP  metFORMIN (GLUCOPHAGE-XR) 500 MG 24 hr tablet Take 1 tablet (500 mg total) by mouth 2 (two) times daily. 12/10/20   [name], Henrine Screws T, NP  metoprolol succinate (TOPROL-XL) 25 MG 24 hr tablet Take 1 tablet by mouth daily. 03/31/19   [provider]  pantoprazole (PROTONIX) 40 MG tablet Take by mouth. 10/21/20   [provider]  polyethylene glycol-electrolytes (NULYTELY) 420 g solution Prepare according to package instructions. Starting at 5:00 PM: Drink one 8 oz glass of mixture every 15 minutes until you finish half of the jug. Five hours prior to  procedure, drink 8 oz glass of mixture every 15 minutes until it is all gone. Make sure you do not drink anything 4 hours prior to your procedure. 09/29/20   Jonathon Bellows, MD  propafenone (RYTHMOL SR) 425 MG 12 hr capsule Take 425 mg by mouth 2 (two) times daily.    [provider]  rosuvastatin (CRESTOR) 5 MG tablet Take 1 tablet (5 mg total) by mouth daily. 12/06/20   [name], Henrine Screws T, NP  tamsulosin (FLOMAX) 0.4 MG CAPS capsule Take 0.8 mg by mouth daily. 07/18/19   [provider]  trimethoprim-polymyxin b (POLYTRIM) ophthalmic solution Apply to eye. 07/14/20   [provider]  vancomycin (VANCOCIN) 125 MG capsule Take 1 capsule (125 mg total) by mouth 4 (four) times daily for 14 days, THEN 1 capsule (125 mg total) in the morning, at noon, and at bedtime for 21 days, THEN 1 capsule (125 mg total) in the morning and at bedtime for 14 days, THEN 1 capsule (125 mg total) daily for 7 days. 11/17/20 01/12/21  Jonathon Bellows, MD    Physical Exam: Vitals:   12/20/20 2100 12/20/20 2115 12/20/20 2130 12/20/20 2145  BP: (!) 188/89  (!) 191/94   Pulse: 69 66 72 74  Resp: 12 13 14 17    Temp:      TempSrc:      SpO2: 100% 99% 98% 100%  Weight:      Height:          Constitutional: Acutely ill looking, no distress Vitals:   12/20/20 2100 12/20/20 2115 12/20/20 2130 12/20/20 2145  BP: (!) 188/89  (!) 191/94   Pulse: 69 66 72 74  Resp: 12 13 14 17   Temp:      TempSrc:      SpO2: 100% 99% 98% 100%  Weight:      Height:       Eyes: PERRL, lids and conjunctivae normal ENMT: Mucous membranes are moist. Posterior pharynx clear of any exudate or lesions.Normal dentition.  Neck: normal, supple, no masses, no thyromegaly Respiratory: clear to auscultation bilaterally, no wheezing, no crackles. Normal respiratory effort. No accessory muscle use.  Cardiovascular: Regular rate and rhythm, no murmurs / rubs / gallops. No extremity edema. 2+ pedal pulses. No carotid bruits.  Abdomen: no tenderness, no masses palpated. No hepatosplenomegaly. Bowel sounds positive.  Musculoskeletal: no clubbing / cyanosis. No joint deformity upper and lower extremities. Good ROM, no contractures. Normal muscle tone.  Skin: no rashes, lesions, ulcers. No induration Neurologic: CN 2-12 grossly intact. Sensation intact, DTR normal. Strength 5/5 in all 4.  Psychiatric: Normal judgment and insight. Alert and oriented x 3. Normal mood.     Labs on Admission: I have personally reviewed following labs and imaging studies  CBC: Recent Labs  Lab 12/20/20 2011  WBC 6.0  HGB 11.1*  HCT 33.3*  MCV 91.2  PLT 824   Basic Metabolic Panel: Recent Labs  Lab 12/20/20 2011  NA 135  K 4.1  CL 101  CO2 27  GLUCOSE 206*  BUN 27*  CREATININE 1.68*  CALCIUM 9.7   GFR: Estimated Creatinine Clearance: 41 mL/min (A) (by C-G formula based on SCr of 1.68 mg/dL (H)). Liver Function Tests: No results for input(s): AST, ALT, ALKPHOS, BILITOT, PROT, ALBUMIN in the last 168 hours. No results for input(s): LIPASE, AMYLASE in the last 168 hours. No results for input(s): AMMONIA in the last 168  hours. Coagulation Profile: No results for input(s): INR, PROTIME in the last  168 hours. Cardiac Enzymes: No results for input(s): CKTOTAL, CKMB, CKMBINDEX, TROPONINI in the last 168 hours. BNP (last 3 results) No results for input(s): PROBNP in the last 8760 hours. HbA1C: No results for input(s): HGBA1C in the last 72 hours. CBG: No results for input(s): GLUCAP in the last 168 hours. Lipid Profile: No results for input(s): CHOL, HDL, LDLCALC, TRIG, CHOLHDL, LDLDIRECT in the last 72 hours. Thyroid Function Tests: No results for input(s): TSH, T4TOTAL, FREET4, T3FREE, THYROIDAB in the last 72 hours. Anemia Panel: No results for input(s): VITAMINB12, FOLATE, FERRITIN, TIBC, IRON, RETICCTPCT in the last 72 hours. Urine analysis:    Component Value Date/Time   COLORURINE YELLOW (A) 06/25/2019 1627   APPEARANCEUR Cloudy (A) 08/20/2019 0949   LABSPEC 1.018 06/25/2019 1627   PHURINE 7.0 06/25/2019 1627   GLUCOSEU Negative 08/20/2019 0949   HGBUR NEGATIVE 06/25/2019 1627   BILIRUBINUR Negative 08/20/2019 0949   KETONESUR NEGATIVE 06/25/2019 1627   PROTEINUR 3+ (A) 08/20/2019 0949   PROTEINUR 30 (A) 06/25/2019 1627   NITRITE Positive (A) 08/20/2019 0949   NITRITE NEGATIVE 06/25/2019 1627   LEUKOCYTESUR 1+ (A) 08/20/2019 0949   LEUKOCYTESUR NEGATIVE 06/25/2019 1627   Sepsis Labs: @LABRCNTIP (procalcitonin:4,lacticidven:4) )No results found for this or any previous visit (from the past 240 hour(s)).   Radiological Exams on Admission: DG Chest 2 View  Result Date: 12/20/2020 CLINICAL DATA:  Chest pain EXAM: CHEST - 2 VIEW COMPARISON:  04/29/2018 FINDINGS: Heart size is normal. No pleural effusion or edema identified. No airspace opacities identified. Dorsal column stimulator is identified within the mid and lower thoracic spinal canal. Thoracic degenerative disc disease identified. IMPRESSION: No acute cardiopulmonary abnormalities. Electronically Signed   By: Kerby Moors M.D.   On:  12/20/2020 20:52    EKG: Independently reviewed.  It shows sinus rhythm with a rate of 70, ST depression in the inferolateral leads with T wave inversion.  Assessment/Plan Principal Problem:   Unstable angina (HCC) Active Problems:   Hypertension associated with diabetes ([place])   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HCC)   BPH with obstruction/lower urinary tract symptoms   Anemia   CKD (chronic kidney disease) stage 3, GFR 30-59 ml/min (HCC)   Acquired thrombophilia ([place])   Peripheral vascular disease ([place])     #1 unstable angina: Chest pain is resolving.  Continue with Eliquis, cycle enzymes, patient was being planned for outpatient stress testing so we will proceed with that.  Continue with morphine and others cardiac work-up.  Cardiology consult  #2 diabetes: Sliding scale insulin.  Continue supportive care  #3 essential hypertension: Continue home regimen.  Continue Toprol-XL and benazepril  #4 chronic kidney disease stage III: Stable.  Continue to monitor renal function.  #5 anemia of chronic disease: Continue to monitor H&H  #6 peripheral vascular disease: Stable.  Continue Crestor  #7 BPH: Continue with Proscar.   DVT prophylaxis: Eliquis Code Status: Full code Family Communication: None Disposition Plan: Home Consults called: [place] [facility] Admission status: Inpatient  Severity of Illness: The appropriate patient status for this patient is INPATIENT. Inpatient status is judged to be reasonable and necessary in order to provide the required intensity of service to ensure the patient's safety. The patient's presenting symptoms, physical exam findings, and initial radiographic and laboratory data in the context of their chronic comorbidities is felt to place them at high risk for further clinical deterioration. Furthermore, it is not anticipated that the patient will be medically stable for discharge from the hospital within 2 midnights  of admission.   * I  certify that at the point of admission it is my clinical judgment that the patient will require inpatient hospital care spanning beyond 2 midnights from the point of admission due to high intensity of service, high risk for further deterioration and high frequency of surveillance required.Barbette Merino MD [facility] Pager [phone]  If 7PM-7AM, please contact night-coverage [email or web address] Password [facility]  12/20/2020, 10:03 PM

## 2020-12-21 ENCOUNTER — Inpatient Hospital Stay
Admit: 2020-12-21 | Discharge: 2020-12-21 | Disposition: A | Discharge status: Home / Self Care | Payer: Medicare Other | Attending: Cardiology | Admitting: Cardiology

## 2020-12-21 ENCOUNTER — Other Ambulatory Visit: Payer: Self-pay

## 2020-12-21 ENCOUNTER — Encounter: Payer: Self-pay | Admitting: Internal Medicine

## 2020-12-21 ENCOUNTER — Other Ambulatory Visit: Payer: Medicare Other

## 2020-12-21 DIAGNOSIS — E1169 Type 2 diabetes mellitus with other specified complication: Secondary | ICD-10-CM

## 2020-12-21 DIAGNOSIS — E785 Hyperlipidemia, unspecified: Secondary | ICD-10-CM

## 2020-12-21 DIAGNOSIS — I480 Paroxysmal atrial fibrillation: Secondary | ICD-10-CM

## 2020-12-21 DIAGNOSIS — I214 Non-ST elevation (NSTEMI) myocardial infarction: Principal | ICD-10-CM

## 2020-12-21 DIAGNOSIS — I10 Essential (primary) hypertension: Secondary | ICD-10-CM

## 2020-12-21 DIAGNOSIS — A0472 Enterocolitis due to Clostridium difficile, not specified as recurrent: Secondary | ICD-10-CM | POA: Insufficient documentation

## 2020-12-21 DIAGNOSIS — N189 Chronic kidney disease, unspecified: Secondary | ICD-10-CM

## 2020-12-21 DIAGNOSIS — N179 Acute kidney failure, unspecified: Secondary | ICD-10-CM | POA: Insufficient documentation

## 2020-12-21 DIAGNOSIS — I252 Old myocardial infarction: Secondary | ICD-10-CM | POA: Diagnosis present

## 2020-12-21 DIAGNOSIS — N400 Enlarged prostate without lower urinary tract symptoms: Secondary | ICD-10-CM

## 2020-12-21 LAB — LIPID PANEL
Cholesterol: 94 mg/dL (ref 0–200)
HDL: 36 mg/dL — ABNORMAL LOW (ref >40)
LDL Cholesterol: 45 mg/dL (ref 0–99)
Total CHOL/HDL Ratio: 2.6 RATIO
Triglycerides: 67 mg/dL (ref <150)
VLDL: 13 mg/dL (ref 0–40)

## 2020-12-21 LAB — ECHOCARDIOGRAM COMPLETE
AR max vel: 2.82 cm2
AV Area VTI: 3.5 cm2
AV Area mean vel: 3.14 cm2
AV Mean grad: 2 mmHg
AV Peak grad: 3.7 mmHg
Ao pk vel: 0.96 m/s
Area-P 1/2: 3.33 cm2
Height: 73 in
MV VTI: 2.3 cm2
S' Lateral: 2.82 cm
Weight: 3068.8 oz

## 2020-12-21 LAB — CBG MONITORING, ED
Glucose-Capillary: 125 mg/dL — ABNORMAL HIGH (ref 70–99)
Glucose-Capillary: 163 mg/dL — ABNORMAL HIGH (ref 70–99)

## 2020-12-21 LAB — APTT
aPTT: 34 seconds (ref 24–36)
aPTT: 99 seconds — ABNORMAL HIGH (ref 24–36)

## 2020-12-21 LAB — HEPARIN LEVEL (UNFRACTIONATED): Heparin Unfractionated: >1.1 IU/mL — ABNORMAL HIGH (ref 0.30–0.70)

## 2020-12-21 LAB — HEMOGLOBIN A1C
Hgb A1c MFr Bld: 6.6 % — ABNORMAL HIGH (ref 4.8–5.6)
Mean Plasma Glucose: 142.72 mg/dL

## 2020-12-21 LAB — GLUCOSE, CAPILLARY
Glucose-Capillary: 138 mg/dL — ABNORMAL HIGH (ref 70–99)
Glucose-Capillary: 163 mg/dL — ABNORMAL HIGH (ref 70–99)

## 2020-12-21 LAB — TROPONIN I (HIGH SENSITIVITY)
Troponin I (High Sensitivity): 719 ng/L (ref <18)
Troponin I (High Sensitivity): 839 ng/L (ref <18)

## 2020-12-21 LAB — PROTIME-INR
INR: 1.2 (ref 0.8–1.2)
Prothrombin Time: 15 seconds (ref 11.4–15.2)

## 2020-12-21 MED ORDER — ISOSORBIDE MONONITRATE ER 30 MG PO TB24
30.0000 mg | ORAL_TABLET | Freq: Every day | ORAL | Status: DC
  Administered 2020-12-21 – 2020-12-23 (×3): 30 mg via ORAL
  Filled 2020-12-21 (×3): qty 1

## 2020-12-21 MED ORDER — SODIUM CHLORIDE 0.9% FLUSH
3.0000 mL | Freq: Two times a day (BID) | INTRAVENOUS | Status: DC
  Administered 2020-12-22 – 2020-12-23 (×2): 3 mL via INTRAVENOUS

## 2020-12-21 MED ORDER — METOPROLOL SUCCINATE ER 25 MG PO TB24
25.0000 mg | ORAL_TABLET | Freq: Every day | ORAL | Status: DC
  Administered 2020-12-21 – 2020-12-23 (×3): 25 mg via ORAL
  Filled 2020-12-21 (×3): qty 1

## 2020-12-21 MED ORDER — VANCOMYCIN HCL 125 MG PO CAPS: 125.0000 mg | ORAL_CAPSULE | Freq: Every day | ORAL | Status: DC

## 2020-12-21 MED ORDER — PANTOPRAZOLE SODIUM 40 MG PO TBEC
40.0000 mg | DELAYED_RELEASE_TABLET | Freq: Every day | ORAL | Status: DC
  Administered 2020-12-21 – 2020-12-23 (×3): 40 mg via ORAL
  Filled 2020-12-21 (×3): qty 1

## 2020-12-21 MED ORDER — APIXABAN 5 MG PO TABS: 5.0000 mg | ORAL_TABLET | Freq: Two times a day (BID) | ORAL | Status: DC

## 2020-12-21 MED ORDER — FINASTERIDE 5 MG PO TABS
5.0000 mg | ORAL_TABLET | Freq: Every day | ORAL | Status: DC
  Administered 2020-12-21 – 2020-12-23 (×3): 5 mg via ORAL
  Filled 2020-12-21 (×3): qty 1

## 2020-12-21 MED ORDER — INSULIN ASPART 100 UNIT/ML IJ SOLN: 0.0000 [IU] | Freq: Every day | INTRAMUSCULAR | Status: DC

## 2020-12-21 MED ORDER — VANCOMYCIN HCL 125 MG PO CAPS
125.0000 mg | ORAL_CAPSULE | Freq: Two times a day (BID) | ORAL | Status: DC
  Administered 2020-12-22 – 2020-12-23 (×2): 125 mg via ORAL
  Filled 2020-12-21 (×4): qty 1

## 2020-12-21 MED ORDER — HEPARIN BOLUS VIA INFUSION
4000.0000 [IU] | Freq: Once | INTRAVENOUS | Status: AC
  Administered 2020-12-21: 4000 [IU] via INTRAVENOUS
  Filled 2020-12-21: qty 4000

## 2020-12-21 MED ORDER — PROPAFENONE HCL 150 MG PO TABS
425.0000 mg | ORAL_TABLET | Freq: Two times a day (BID) | ORAL | Status: DC
  Administered 2020-12-21 – 2020-12-23 (×4): 450 mg via ORAL
  Filled 2020-12-21 (×6): qty 3

## 2020-12-21 MED ORDER — ROSUVASTATIN CALCIUM 5 MG PO TABS
5.0000 mg | ORAL_TABLET | Freq: Every evening | ORAL | Status: DC
  Administered 2020-12-21 – 2020-12-22 (×2): 5 mg via ORAL
  Filled 2020-12-21 (×3): qty 1

## 2020-12-21 MED ORDER — INSULIN ASPART 100 UNIT/ML IJ SOLN
0.0000 [IU] | Freq: Three times a day (TID) | INTRAMUSCULAR | Status: DC
  Administered 2020-12-21: 3 [IU] via SUBCUTANEOUS
  Administered 2020-12-21 – 2020-12-23 (×4): 2 [IU] via SUBCUTANEOUS
  Filled 2020-12-21 (×5): qty 1

## 2020-12-21 MED ORDER — VANCOMYCIN HCL 125 MG PO CAPS
125.0000 mg | ORAL_CAPSULE | Freq: Three times a day (TID) | ORAL | Status: AC
  Administered 2020-12-21 (×3): 125 mg via ORAL
  Filled 2020-12-21 (×4): qty 1

## 2020-12-21 MED ORDER — ONDANSETRON HCL 4 MG/2ML IJ SOLN: 4.0000 mg | Freq: Four times a day (QID) | INTRAMUSCULAR | Status: DC | PRN

## 2020-12-21 MED ORDER — HEPARIN (PORCINE) 25000 UT/250ML-% IV SOLN
950.0000 [IU]/h | INTRAVENOUS | Status: DC
  Administered 2020-12-21: 1100 [IU]/h via INTRAVENOUS
  Filled 2020-12-21 (×2): qty 250

## 2020-12-21 MED ORDER — PROPAFENONE HCL 225 MG PO TABS
425.0000 mg | ORAL_TABLET | Freq: Two times a day (BID) | ORAL | Status: DC
  Filled 2020-12-21 (×2): qty 0.5

## 2020-12-21 MED ORDER — ACETAMINOPHEN 325 MG PO TABS: 650.0000 mg | ORAL_TABLET | ORAL | Status: DC | PRN

## 2020-12-21 MED ORDER — SODIUM CHLORIDE 0.45 % IV SOLN: INTRAVENOUS | Status: DC

## 2020-12-21 NOTE — ED Notes (Signed)
Provided patient with hospital bed and assisted to toilet. Patient needed mild assistance when getting out of bed.

## 2020-12-21 NOTE — Consult Note (Signed)
ANTICOAGULATION CONSULT NOTE - Initial Consult  Pharmacy Consult for Heparin Infusion Indication: chest pain/ACS  Allergies  Allergen Reactions   Levaquin [Levofloxacin In D5w] Anaphylaxis and Shortness Of Breath   Shellfish Allergy Anaphylaxis    Has used duraprep, betadine and ioban in previous surgeries in 2019 and 2018 without issue   Amiodarone Other (See Comments)    Tremors and thyroid toxicity   Adhesive [Tape] Other (See Comments)    Little red bumps under the dressing.  He questions whether is latex related    Patient Measurements: Height: 6\' 1"  (185.4 cm) Weight: 87 kg (191 lb 12.8 oz) IBW/kg (Calculated) : 79.9 Heparin Dosing Weight: 87 kg  Vital Signs: BP: 157/89 (11/08 0800) Pulse Rate: 56 (11/08 0800)  Labs: Recent Labs    12/20/20 2011 12/21/20 0652  HGB 11.1*  --   HCT 33.3*  --   PLT 151  --   CREATININE 1.68*  --   TROPONINIHS 18* 719*    Estimated Creatinine Clearance: 41 mL/min (A) (by C-G formula based on SCr of 1.68 mg/dL (H)).   Medical History: Past Medical History:  Diagnosis Date   Anemia    Anxiety    Arthritis    Arthritis of neck    Atrial fibrillation (HCC)    Cataracts, bilateral    Complication of anesthesia    pt reports low BP's after surgery at 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 Troponin level 18 and 719. Plan for cardiac catheterization with selective coronary arteriography on 12/22/20. Last apixaban dose was taken on 11/7@~2000. Baseline labs are ordered and pending.  Goal of Therapy:  Heparin level 0.3-0.7 units/ml aPTT 66-102 seconds Monitor platelets by anticoagulation protocol: Yes   Plan:  Give 4000 units bolus x 1 Start heparin infusion at 1100 units/hr Check aPTT level in 8 hours and daily while on heparin. Will transition to heparin level dosing once correlation with aPTT is achieved. Continue to monitor H&H and platelets  Tykeshia Tourangeau A Emogene Muratalla 12/21/2020,9:58 AM

## 2020-12-21 NOTE — Consult Note (Addendum)
[facility] Cardiology  CARDIOLOGY CONSULT NOTE  Patient ID: Sean Reyes. MRN: [id number] DOB/AGE: Oct 13, 1940 80 y.o.  Admit date: 12/20/2020 Referring Physician [place] Primary Physician Marnee Guarneri NP Primary Cardiologist Nehemiah Massed Reason for Consultation chest pain  HPI: 80 year old gentleman referred for evaluation of chest pain.  Patient has a history of paroxysmal atrial fibrillation, status post catheter ablation x2, on Eliquis for stroke prevention.  The patient presents with 2 day history of intermittent neck and upper chest discomfort, scribed as pressure-like sensation radiation to his left arm associated with mild diaphoresis.  He saw his cardiologist 12/19/2020 at which time ECG was nondiagnostic and outpatient stress Myoview was scheduled.  Patient had recurrent discomfort and [facility] ED.  ECG revealed sinus rhythm at 70 bpm with nonspecific ST-T wave abnormalities inferolaterally.  The patient ruled in for non-ST elevation myocardial infarction with high-sensitivity troponin of 18 and 719.  The patient currently denies chest pain.  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   Hyperlipidemia    Hypertension    Nocturia    S/P ablation of atrial fibrillation    Ablative therapy   Sleep apnea    CPAP    Spinal stenosis    Tachycardia, unspecified     Past Surgical History:  Procedure Laterality Date   ABLATION     ANTERIOR LAT LUMBAR FUSION N/A  06/27/2017   Procedure: Anterior Lateral Lumbar Interbody  Fusion - Lumbar Two-Lumbar Three - Lumbar Three-Lumbar Four, Posterior Lumbar Interbody Fusion Lumbar Four- Five;  Surgeon: Kary Kos, MD;  Location: [place];  Service: Neurosurgery;  Laterality: N/A;  Anterior Lateral Lumbar Interbody  Fusion - Lumbar Two-Lumbar Three - Lumbar Three-Lumbar Four, Posterior Lumbar Interbody Fusion Lumbar Four- Five   BACK SURGERY     CARDIOVERSION N/A 08/29/2018   Procedure: CARDIOVERSION;  Surgeon: Corey Skains, MD;  Location: [facility] ORS;  Service: Cardiovascular;  Laterality: N/A;   CARDIOVERSION N/A 09/24/2018   Procedure: CARDIOVERSION;  Surgeon: Corey Skains, MD;  Location: [facility] ORS;  Service: Cardiovascular;  Laterality: N/A;   COLONOSCOPY WITH PROPOFOL N/A 10/05/2015   Procedure: COLONOSCOPY WITH PROPOFOL;  Surgeon: Lollie Sails, MD;  Location[facility] ENDOSCOPY;  Service: Endoscopy;  Laterality: N/A;   COLONOSCOPY WITH PROPOFOL N/A 11/01/2020   Procedure: COLONOSCOPY WITH PROPOFOL;  Surgeon: Jonathon Bellows, MD;  Location: [facility] ENDOSCOPY;  Service: Gastroenterology;  Laterality: N/A;   ESOPHAGOGASTRODUODENOSCOPY N/A 11/01/2020   Procedure: ESOPHAGOGASTRODUODENOSCOPY (EGD);  Surgeon: Jonathon Bellows, MD;  Location: [facility] ENDOSCOPY;  Service: Gastroenterology;  Laterality: N/A;   ESOPHAGOGASTRODUODENOSCOPY (EGD) WITH PROPOFOL N/A 04/01/2018   Procedure: ESOPHAGOGASTRODUODENOSCOPY (EGD) WITH PROPOFOL;  Surgeon: Lollie Sails, MD;  Location: [facility] ENDOSCOPY;  Service: Endoscopy;  Laterality: N/A;   HERNIA REPAIR     JOINT REPLACEMENT Bilateral  hips  RT+  LEFT X2    LUMBAR LAMINECTOMY/DECOMPRESSION MICRODISCECTOMY Left 09/13/2016   Procedure: Microdiscectomy - Lumbar two-three,  Lumbar three- - left;  Surgeon: Kary Kos, MD;  Location: [place];  Service: Neurosurgery;  Laterality: Left;   SPINAL CORD STIMULATOR INSERTION  07/08/2019   TONSILLECTOMY      (Not in a hospital admission)  Social History    Socioeconomic History   Marital status: Married    Spouse name: Malachy Mood    Number of children: 2   Years of education: Not on file   Highest education level: High school graduate  Occupational History   Occupation: retired   Tobacco Use   Smoking status: Never   Smokeless tobacco: Never  Vaping Use   Vaping Use: Never used  Substance and Sexual Activity   Alcohol use: No   Drug use: No   Sexual activity: Not on file  Other Topics Concern   Not on file  Social History Narrative   Married   Gets regular exercise   Social Determinants of Health   Financial Resource Strain: Low Risk    Difficulty of Paying Living Expenses: Not hard at all  Food Insecurity: No Food Insecurity   Worried About Charity fundraiser in the Last Year: Never true   Arboriculturist in the Last Year: Never true  Transportation Needs: No Transportation Needs   Lack of Transportation (Medical): No   Lack of Transportation (Non-Medical): No  Physical Activity: Inactive   Days of Exercise per Week: 0 days   Minutes of Exercise per Session: 0 min  Stress: No Stress Concern Present   Feeling of Stress : Not at all  Social Connections: Moderately Integrated   Frequency of Communication with Friends and Family: More than three times a week   Frequency of Social Gatherings with Friends and Family: More than three times a week   Attends Religious Services: More than 4 times per year   Active Member of [employer] or Organizations: No   Attends Music therapist: Never   Marital Status: Married  Human resources officer Violence: Not At Risk   Fear of Current or Ex-Partner: No   Emotionally Abused: No   Physically Abused: No   Sexually Abused: No    Family History  Problem Relation Age of Onset   Brain cancer Mother    Kidney disease Neg Hx    Prostate cancer Neg Hx    Kidney cancer Neg Hx    Bladder Cancer Neg Hx       Review of systems complete and found to be negative unless listed above       PHYSICAL EXAM  General: Well developed, well nourished, in no acute distress HEENT:  Normocephalic and atramatic Neck:  No JVD.  Lungs: Clear bilaterally to auscultation and percussion. Heart: HRRR . Normal S1 and S2 without gallops or murmurs.  Abdomen: Bowel sounds are positive, abdomen soft and non-tender  Msk:  Back normal, normal gait. Normal strength and tone for age. Extremities: No clubbing, cyanosis or edema.   Neuro: Alert and oriented X 3. Psych:  Good affect, responds appropriately  Labs:   Lab Results  Component Value Date   WBC 6.0 12/20/2020   HGB 11.1 (L) 12/20/2020   HCT 33.3 (L) 12/20/2020   MCV 91.2 12/20/2020   PLT 151 12/20/2020    Recent Labs  Lab 12/20/20 2011  NA 135  K 4.1  CL 101  CO2 27  BUN 27*  CREATININE 1.68*  CALCIUM 9.7  GLUCOSE 206*   Lab Results  Component Value Date   TROPONINI <0.03 08/08/2014    Lab Results  Component Value Date   CHOL 94 12/21/2020   CHOL 105 09/09/2020   CHOL 120 02/26/2020   Lab Results  Component Value Date   HDL 36 (L) 12/21/2020   HDL 41 09/09/2020   HDL 43 02/26/2020   Lab Results  Component Value Date   LDLCALC 45 12/21/2020   LDLCALC 43 09/09/2020   LDLCALC 58 02/26/2020   Lab Results  Component Value Date   TRIG 67 12/21/2020   TRIG 113 09/09/2020   TRIG 103 02/26/2020   Lab Results  Component Value Date   CHOLHDL 2.6 12/21/2020   CHOLHDL 3.6 12/20/2017   CHOLHDL 3.2 09/29/2016   No results found for: LDLDIRECT    Radiology: DG Chest 2 View  Result Date: 12/20/2020 CLINICAL DATA:  Chest pain EXAM: CHEST - 2 VIEW COMPARISON:  04/29/2018 FINDINGS: Heart size is normal. No pleural effusion or edema identified. No airspace opacities identified. Dorsal column stimulator is identified within the mid and lower thoracic spinal canal. Thoracic degenerative disc disease identified. IMPRESSION: No acute cardiopulmonary abnormalities. Electronically Signed   By: Kerby Moors M.D.    On: 12/20/2020 20:52    EKG: Sinus rhythm 70 bpm with nonspecific ST-T abnormalities inferolaterally  ASSESSMENT AND PLAN:   1.  NSTEMI, high-sensitivity troponin 18 and 719 for abnormal ECG. 2.  Paroxysmal atrial fibrillation, status post catheter ablation x2, on Eliquis for stroke prevention, and propafenone for rate and rhythm control 3.  Essential hypertension, initial blood pressure markedly elevated, now much improved 4.  Hyperlipidemia, on rosuvastatin 5.  Type 2 diabetes 6.  Chronic kidney disease  Recommendations  1.  Agree with overall current therapy 2.  Heparin bolus and drip 3.  Hold Eliquis 4.  Resume metoprolol succinate 5.  Resume propafenone 6.  2D echocardiogram 7.  Proceed with cardiac catheterization with selective coronary arteriography on 12/22/2020.  The risk, benefits alternatives of cardiac catheterization and possible PCI were explained to the patient and informed consent was obtained.  Signed: [name] MD,PhD, [facility] 12/21/2020, 8:25 AM

## 2020-12-21 NOTE — Progress Notes (Signed)
Patient ID: Sean Say., male   DOB: 1940-12-02, 80 y.o.   MRN: [id number] [facility] PROGRESS NOTE  Sean Reyes. [id number]:[id number] DOB: 03-09-1940 DOA: 12/20/2020 PCP: [name], NP  HPI/Subjective: Patient coming in with pressure in his neck that moved down into his chest and left shoulder.  His first troponin was negative but next 2 troponins have risen.  Patient now has NSTEMI.  No shortness of breath.  No current chest pain.  Feeling a little bit better now.  Objective: Vitals:   12/21/20 1000 12/21/20 1141  BP: (!) 152/78 (!) 161/91  Pulse: 60 62  Resp: 17 15  Temp:    SpO2: 100% 100%    Intake/Output Summary (Last 24 hours) at 12/21/2020 1154 Last data filed at 12/21/2020 1141 Gross per 24 hour  Intake --  Output 575 ml  Net -575 ml   Filed Weights   12/20/20 2010  Weight: 87 kg    ROS: Review of Systems  Respiratory:  Negative for cough and shortness of breath.   Cardiovascular:  Positive for chest pain.  Gastrointestinal:  Negative for abdominal pain, nausea and vomiting.  Exam: Physical Exam HENT:     Head: Normocephalic.     Mouth/Throat:     Pharynx: No oropharyngeal exudate.  Eyes:     General: Lids are normal.     Conjunctiva/sclera: Conjunctivae normal.  Cardiovascular:     Rate and Rhythm: Normal rate and regular rhythm.     Heart sounds: Normal heart sounds, S1 normal and S2 normal.  Pulmonary:     Breath sounds: No decreased breath sounds, wheezing, rhonchi or rales.  Abdominal:     Palpations: Abdomen is soft.     Tenderness: There is no abdominal tenderness.  Musculoskeletal:     Right ankle: No swelling.     Left ankle: No swelling.  Skin:    General: Skin is warm.     Findings: No rash.  Neurological:     Mental Status: He is alert and oriented to person, place, and time.      Scheduled Meds:  insulin aspart  0-15 Units Subcutaneous TID WC   insulin aspart  0-5 Units Subcutaneous QHS   metoprolol succinate  25 mg  Oral Daily   propafenone  450 mg Oral BID   sodium chloride flush  3 mL Intravenous Q12H   Continuous Infusions:  sodium chloride 75 mL/hr at 12/21/20 0735   heparin 1,100 Units/hr (12/21/20 1038)    Assessment/Plan:  NSTEMI with rising troponin.  Converting Eliquis over to heparin drip.  Cardiac cath tomorrow as per Dr. Lorinda Creed.  Metoprolol and Crestor. Acute kidney injury on chronic kidney disease stage IIIa.  Creatinine 1.68 on presentation and baseline creatinine 1.38.  Continue IV fluids prior to cardiac catheterization. Paroxysmal atrial fibrillation.  Patient had catheter ablation x2.  Converting from Eliquis over to heparin drip.  Continue propafenone.  Also on metoprolol. C. difficile colitis on vancomycin taper.  Spoke with pharmacist to order vancomycin taper as per previous.  Put in C. difficile isolation. Type 2 diabetes mellitus with hyperlipidemia unspecified on Crestor.  Hold Glucophage for cardiac cath.  Sliding scale insulin ordered. Essential hypertension on metoprolol.  Reorder Imdur.  Hold ACE inhibitor with acute kidney injury. BPH on Proscar        Code Status:     Code Status Orders  (From admission, onward)           Start  Ordered   12/21/20 0707  Full code  Continuous        12/21/20 0706           Code Status History     Date Active Date Inactive Code Status Order ID Comments User Context   07/19/2018 2014 07/23/2018 1325 Full Code [id number]  Hillary Bow, MD ED   04/29/2018 1258 04/30/2018 1940 Full Code [id number]  Kary Kos, MD Inpatient   06/27/2017 1807 07/02/2017 1333 Full Code [id number]  Kary Kos, MD Inpatient   09/13/2016 1214 09/14/2016 1723 Full Code [id number]  Kary Kos, MD Inpatient      Family Communication: Patient declined me calling family states he will call. Disposition Plan: Status is: Inpatient  Consultants: Cardiology  Antibiotics: Oral vancomycin  Time spent: 28 minutes, case discussed with  cardiology  [place]

## 2020-12-21 NOTE — ED Notes (Signed)
Pt brought over to room 35 in hospital bed. Denies any pain at this time. Pt given phone to contact his family.

## 2020-12-21 NOTE — Consult Note (Signed)
ANTICOAGULATION CONSULT NOTE   Pharmacy Consult for Heparin Infusion Indication: chest pain/ACS  Allergies  Allergen Reactions   Levaquin [Levofloxacin In D5w] Anaphylaxis and Shortness Of Breath   Shellfish Allergy Anaphylaxis    Has used duraprep, betadine and ioban in previous surgeries in 2019 and 2018 without issue   Amiodarone Other (See Comments)    Tremors and thyroid toxicity   Adhesive [Tape] Other (See Comments)    Little red bumps under the dressing.  He questions whether is latex related    Patient Measurements: Height: 6\' 1"  (185.4 cm) Weight: 87 kg (191 lb 12.8 oz) IBW/kg (Calculated) : 79.9 Heparin Dosing Weight: 87 kg  Vital Signs: Temp: 97.8 F (36.6 C) (11/08 1949) Temp Source: Oral (11/08 1949) BP: 180/90 (11/08 1949) Pulse Rate: 73 (11/08 1949)  Labs: Recent Labs    12/20/20 2011 12/21/20 0652 12/21/20 0949 12/21/20 1127 12/21/20 1828  HGB 11.1*  --   --   --   --   HCT 33.3*  --   --   --   --   PLT 151  --   --   --   --   APTT  --   --  34  --  99*  LABPROT  --   --  15.0  --   --   INR  --   --  1.2  --   --   HEPARINUNFRC  --   --  >1.10*  --   --   CREATININE 1.68*  --   --   --   --   TROPONINIHS 18* 719*  --  839*  --      Estimated Creatinine Clearance: 41 mL/min (A) (by C-G formula based on SCr of 1.68 mg/dL (H)).   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 Troponin level 18 and 719. Plan for cardiac catheterization with selective coronary arteriography on 12/22/20. Last apixaban dose was taken on 11/7@~2000. Baseline labs are ordered and pending.  11/8 1828 aPTT 99  Goal of Therapy:  Heparin level 0.3-0.7 units/ml once aPTT and heparin correlate.  aPTT 66-102 seconds Monitor platelets by anticoagulation protocol: Yes   Plan:  aPTT is therapeutic. Will continue heparin infusion at current rate (1100 units/hr). Recheck aPTT/HL/CBC in  8 hours. Will transition to heparin level dosing once correlation with aPTT is achieved. Continue  to monitor H&H and platelets  Oswald Hillock, PharmD, BCPS 12/21/2020,7:54 PM

## 2020-12-21 NOTE — ED Notes (Signed)
Critical result from lab for Troponin 719. MD notified.

## 2020-12-21 NOTE — Progress Notes (Signed)
*  PRELIMINARY RESULTS* Echocardiogram 2D Echocardiogram has been performed.  Sean Reyes 12/21/2020, 10:23 AM

## 2020-12-21 NOTE — ED Notes (Signed)
Lab to add on heparin level

## 2020-12-21 NOTE — ED Notes (Signed)
Pt set up with breakfast tray.

## 2020-12-22 ENCOUNTER — Encounter
Admission: EM | Disposition: A | Discharge status: Home / Self Care | Payer: Self-pay | Source: Home / Self Care | Attending: Internal Medicine

## 2020-12-22 ENCOUNTER — Inpatient Hospital Stay: Payer: Medicare Other | Admitting: Internal Medicine

## 2020-12-22 ENCOUNTER — Encounter: Payer: Self-pay | Admitting: Cardiology

## 2020-12-22 ENCOUNTER — Other Ambulatory Visit: Payer: Self-pay

## 2020-12-22 ENCOUNTER — Inpatient Hospital Stay: Payer: Medicare Other

## 2020-12-22 DIAGNOSIS — E440 Moderate protein-calorie malnutrition: Secondary | ICD-10-CM | POA: Insufficient documentation

## 2020-12-22 HISTORY — PX: LEFT HEART CATH AND CORONARY ANGIOGRAPHY: CATH118249

## 2020-12-22 HISTORY — PX: CORONARY STENT INTERVENTION: CATH118234

## 2020-12-22 LAB — BASIC METABOLIC PANEL
Anion gap: 8 (ref 5–15)
BUN: 19 mg/dL (ref 8–23)
CO2: 26 mmol/L (ref 22–32)
Calcium: 8.8 mg/dL — ABNORMAL LOW (ref 8.9–10.3)
Chloride: 102 mmol/L (ref 98–111)
Creatinine, Ser: 1.28 mg/dL — ABNORMAL HIGH (ref 0.61–1.24)
GFR, Estimated: 57 mL/min — ABNORMAL LOW (ref >60)
Glucose, Bld: 132 mg/dL — ABNORMAL HIGH (ref 70–99)
Potassium: 3.8 mmol/L (ref 3.5–5.1)
Sodium: 136 mmol/L (ref 135–145)

## 2020-12-22 LAB — CBC
HCT: 31 % — ABNORMAL LOW (ref 39.0–52.0)
Hemoglobin: 10.4 g/dL — ABNORMAL LOW (ref 13.0–17.0)
MCH: 30.7 pg (ref 26.0–34.0)
MCHC: 33.5 g/dL (ref 30.0–36.0)
MCV: 91.4 fL (ref 80.0–100.0)
Platelets: 157 10*3/uL (ref 150–400)
RBC: 3.39 MIL/uL — ABNORMAL LOW (ref 4.22–5.81)
RDW: 13.1 % (ref 11.5–15.5)
WBC: 7.2 10*3/uL (ref 4.0–10.5)
nRBC: 0 % (ref 0.0–0.2)

## 2020-12-22 LAB — GLUCOSE, CAPILLARY
Glucose-Capillary: 146 mg/dL — ABNORMAL HIGH (ref 70–99)
Glucose-Capillary: 172 mg/dL — ABNORMAL HIGH (ref 70–99)
Glucose-Capillary: 144 mg/dL — ABNORMAL HIGH (ref 70–99)
Glucose-Capillary: 173 mg/dL — ABNORMAL HIGH (ref 70–99)

## 2020-12-22 LAB — POCT ACTIVATED CLOTTING TIME: Activated Clotting Time: 376 seconds

## 2020-12-22 LAB — HEPARIN LEVEL (UNFRACTIONATED): Heparin Unfractionated: >1.1 IU/mL — ABNORMAL HIGH (ref 0.30–0.70)

## 2020-12-22 LAB — APTT
aPTT: 118 seconds — ABNORMAL HIGH (ref 24–36)
aPTT: 122 seconds — ABNORMAL HIGH (ref 24–36)

## 2020-12-22 SURGERY — LEFT HEART CATH AND CORONARY ANGIOGRAPHY
Anesthesia: Moderate Sedation

## 2020-12-22 MED ORDER — VERAPAMIL HCL 2.5 MG/ML IV SOLN
INTRAVENOUS | Status: DC | PRN
  Administered 2020-12-22: 2.5 mg via INTRA_ARTERIAL

## 2020-12-22 MED ORDER — ASPIRIN 81 MG PO CHEW
CHEWABLE_TABLET | ORAL | Status: AC
  Filled 2020-12-22: qty 1

## 2020-12-22 MED ORDER — SODIUM CHLORIDE 0.9 % WEIGHT BASED INFUSION
1.0000 mL/kg/h | INTRAVENOUS | Status: DC
  Administered 2020-12-22: 1 mL/kg/h via INTRAVENOUS

## 2020-12-22 MED ORDER — TICAGRELOR 90 MG PO TABS
ORAL_TABLET | ORAL | Status: DC | PRN
  Administered 2020-12-22: 180 mg via ORAL

## 2020-12-22 MED ORDER — SODIUM CHLORIDE 0.9 % IV SOLN: 250.0000 mL | INTRAVENOUS | Status: DC | PRN

## 2020-12-22 MED ORDER — ACETAMINOPHEN 325 MG PO TABS: 650.0000 mg | ORAL_TABLET | ORAL | Status: DC | PRN

## 2020-12-22 MED ORDER — HEPARIN (PORCINE) IN NACL 1000-0.9 UT/500ML-% IV SOLN
INTRAVENOUS | Status: AC
  Filled 2020-12-22: qty 1000

## 2020-12-22 MED ORDER — MIDAZOLAM HCL 2 MG/2ML IJ SOLN
INTRAMUSCULAR | Status: DC | PRN
  Administered 2020-12-22: 1 mg via INTRAVENOUS

## 2020-12-22 MED ORDER — ASPIRIN 81 MG PO CHEW
CHEWABLE_TABLET | ORAL | Status: DC | PRN
  Administered 2020-12-22: 243 mg via ORAL

## 2020-12-22 MED ORDER — SODIUM CHLORIDE 0.9% FLUSH
3.0000 mL | Freq: Two times a day (BID) | INTRAVENOUS | Status: DC
  Administered 2020-12-22 – 2020-12-23 (×2): 3 mL via INTRAVENOUS

## 2020-12-22 MED ORDER — ADULT MULTIVITAMIN W/MINERALS CH
1.0000 | ORAL_TABLET | Freq: Every day | ORAL | Status: DC
  Administered 2020-12-22 – 2020-12-23 (×2): 1 via ORAL
  Filled 2020-12-22 (×2): qty 1

## 2020-12-22 MED ORDER — LIDOCAINE HCL 1 % IJ SOLN
INTRAMUSCULAR | Status: AC
  Filled 2020-12-22: qty 20

## 2020-12-22 MED ORDER — SODIUM CHLORIDE 0.9 % WEIGHT BASED INFUSION: 1.0000 mL/kg/h | INTRAVENOUS | Status: AC

## 2020-12-22 MED ORDER — TICAGRELOR 90 MG PO TABS
ORAL_TABLET | ORAL | Status: AC
  Filled 2020-12-22: qty 2

## 2020-12-22 MED ORDER — FENTANYL CITRATE (PF) 100 MCG/2ML IJ SOLN
INTRAMUSCULAR | Status: AC
  Filled 2020-12-22: qty 2

## 2020-12-22 MED ORDER — ASPIRIN 81 MG PO CHEW
81.0000 mg | CHEWABLE_TABLET | Freq: Every day | ORAL | Status: DC
  Administered 2020-12-23: 81 mg via ORAL
  Filled 2020-12-22: qty 1

## 2020-12-22 MED ORDER — VERAPAMIL HCL 2.5 MG/ML IV SOLN
INTRAVENOUS | Status: AC
  Filled 2020-12-22: qty 2

## 2020-12-22 MED ORDER — SODIUM CHLORIDE 0.9 % WEIGHT BASED INFUSION: 3.0000 mL/kg/h | INTRAVENOUS | Status: AC

## 2020-12-22 MED ORDER — IOHEXOL 350 MG/ML SOLN
INTRAVENOUS | Status: DC | PRN
  Administered 2020-12-22: 145 mL

## 2020-12-22 MED ORDER — SODIUM CHLORIDE 0.9% FLUSH: 3.0000 mL | INTRAVENOUS | Status: DC | PRN

## 2020-12-22 MED ORDER — NITROGLYCERIN 1 MG/10 ML FOR IR/CATH LAB
INTRA_ARTERIAL | Status: DC | PRN
  Administered 2020-12-22: 200 ug via INTRACORONARY

## 2020-12-22 MED ORDER — NITROGLYCERIN 1 MG/10 ML FOR IR/CATH LAB
INTRA_ARTERIAL | Status: AC
  Filled 2020-12-22: qty 10

## 2020-12-22 MED ORDER — HEPARIN SODIUM (PORCINE) 1000 UNIT/ML IJ SOLN
INTRAMUSCULAR | Status: DC | PRN
  Administered 2020-12-22: 4200 [IU] via INTRAVENOUS
  Administered 2020-12-22: 8000 [IU] via INTRAVENOUS

## 2020-12-22 MED ORDER — FENTANYL CITRATE (PF) 100 MCG/2ML IJ SOLN
INTRAMUSCULAR | Status: DC | PRN
  Administered 2020-12-22 (×2): 50 ug via INTRAVENOUS

## 2020-12-22 MED ORDER — HYDRALAZINE HCL 20 MG/ML IJ SOLN: 10.0000 mg | INTRAMUSCULAR | Status: AC | PRN

## 2020-12-22 MED ORDER — HEPARIN SODIUM (PORCINE) 1000 UNIT/ML IJ SOLN
INTRAMUSCULAR | Status: AC
  Filled 2020-12-22: qty 1

## 2020-12-22 MED ORDER — HEPARIN (PORCINE) IN NACL 1000-0.9 UT/500ML-% IV SOLN
INTRAVENOUS | Status: DC | PRN
  Administered 2020-12-22 (×2): 500 mL

## 2020-12-22 MED ORDER — ASPIRIN 81 MG PO CHEW
81.0000 mg | CHEWABLE_TABLET | ORAL | Status: AC
  Administered 2020-12-22: 81 mg via ORAL

## 2020-12-22 MED ORDER — ONDANSETRON HCL 4 MG/2ML IJ SOLN: 4.0000 mg | Freq: Four times a day (QID) | INTRAMUSCULAR | Status: DC | PRN

## 2020-12-22 MED ORDER — TICAGRELOR 90 MG PO TABS
90.0000 mg | ORAL_TABLET | Freq: Two times a day (BID) | ORAL | Status: DC
  Administered 2020-12-22 – 2020-12-23 (×2): 90 mg via ORAL
  Filled 2020-12-22 (×2): qty 1

## 2020-12-22 MED ORDER — LIDOCAINE HCL (PF) 1 % IJ SOLN
INTRAMUSCULAR | Status: DC | PRN
  Administered 2020-12-22: 2 mL

## 2020-12-22 MED ORDER — LABETALOL HCL 5 MG/ML IV SOLN: 10.0000 mg | INTRAVENOUS | Status: AC | PRN

## 2020-12-22 MED ORDER — ASPIRIN 81 MG PO CHEW
CHEWABLE_TABLET | ORAL | Status: AC
  Filled 2020-12-22: qty 3

## 2020-12-22 MED ORDER — MIDAZOLAM HCL 2 MG/2ML IJ SOLN
INTRAMUSCULAR | Status: AC
  Filled 2020-12-22: qty 2

## 2020-12-22 SURGICAL SUPPLY — 19 items
BALLN TREK RX 3.0X15 (BALLOONS) ×2
BALLOON TREK RX 3.0X15 (BALLOONS) ×1 IMPLANT
CATH INFINITI 5 FR JL3.5 (CATHETERS) ×2 IMPLANT
CATH INFINITI JR4 5F (CATHETERS) ×2 IMPLANT
CATH VISTA GUIDE 6FR JR4 (CATHETERS) ×2 IMPLANT
DEVICE RAD TR BAND REGULAR (VASCULAR PRODUCTS) ×2 IMPLANT
DRAPE BRACHIAL (DRAPES) ×2 IMPLANT
GLIDESHEATH SLEND SS 6F .021 (SHEATH) ×2 IMPLANT
GUIDEWIRE INQWIRE 1.5J.035X260 (WIRE) ×1 IMPLANT
INQWIRE 1.5J .035X260CM (WIRE) ×2
KIT ENCORE 26 ADVANTAGE (KITS) ×2 IMPLANT
KIT SYRINGE INJ CVI SPIKEX1 (MISCELLANEOUS) ×2 IMPLANT
PACK CARDIAC CATH (CUSTOM PROCEDURE TRAY) ×2 IMPLANT
PROTECTION STATION PRESSURIZED (MISCELLANEOUS) ×2
SET ATX SIMPLICITY (MISCELLANEOUS) ×2 IMPLANT
STATION PROTECTION PRESSURIZED (MISCELLANEOUS) ×1 IMPLANT
STENT ONYX FRONTIER 4.0X18 (Permanent Stent) ×2 IMPLANT
TUBING CIL FLEX 10 FLL-RA (TUBING) ×2 IMPLANT
WIRE ASAHI PROWATER 180CM (WIRE) ×2 IMPLANT

## 2020-12-22 NOTE — Progress Notes (Signed)
Patient ID: Sean Reyes., male   DOB: 03/14/1940, 80 y.o.   MRN: [id number] [facility] PROGRESS NOTE  Sean Reyes. [id number] DOB: Jul 11, 1940 DOA: 12/20/2020 PCP: [name], NP  HPI/Subjective: Patient seen this morning.  Had a little discomfort in his neck overnight but no chest pain or shortness of breath.  Admitted with NSTEMI.  Objective: Vitals:   12/22/20 1315 12/22/20 1331  BP:  (!) 155/98  Pulse: 63   Resp: 17 18  Temp:    SpO2: 98% 98%    Intake/Output Summary (Last 24 hours) at 12/22/2020 1343 Last data filed at 12/22/2020 1300 Gross per 24 hour  Intake 1701.47 ml  Output 2400 ml  Net -698.53 ml   Filed Weights   12/20/20 2010  Weight: 87 kg    ROS: Review of Systems  Respiratory:  Negative for cough and shortness of breath.   Cardiovascular:  Negative for chest pain.  Gastrointestinal:  Negative for abdominal pain, nausea and vomiting.  Musculoskeletal:  Positive for neck pain.  Exam: Physical Exam HENT:     Head: Normocephalic.     Mouth/Throat:     Pharynx: No oropharyngeal exudate.  Eyes:     General: Lids are normal.     Conjunctiva/sclera: Conjunctivae normal.     Pupils: Pupils are equal, round, and reactive to light.  Cardiovascular:     Rate and Rhythm: Normal rate and regular rhythm.     Heart sounds: Normal heart sounds, S1 normal and S2 normal.  Pulmonary:     Breath sounds: No decreased breath sounds, wheezing, rhonchi or rales.  Abdominal:     Palpations: Abdomen is soft.     Tenderness: There is no abdominal tenderness.  Musculoskeletal:     Right lower leg: Swelling present.     Left lower leg: Swelling present.  Skin:    General: Skin is warm.     Findings: No rash.  Neurological:     Mental Status: He is alert and oriented to person, place, and time.      Scheduled Meds:  aspirin  81 mg Oral Daily   [MAR Hold] finasteride  5 mg Oral Daily   [MAR Hold] insulin aspart  0-15 Units Subcutaneous TID WC    [MAR Hold] insulin aspart  0-5 Units Subcutaneous QHS   [MAR Hold] isosorbide mononitrate  30 mg Oral Daily   [MAR Hold] metoprolol succinate  25 mg Oral Daily   [MAR Hold] pantoprazole  40 mg Oral Daily   [MAR Hold] propafenone  450 mg Oral BID   [MAR Hold] rosuvastatin  5 mg Oral QPM   [MAR Hold] sodium chloride flush  3 mL Intravenous Q12H   sodium chloride flush  3 mL Intravenous Q12H   ticagrelor  90 mg Oral BID   [MAR Hold] vancomycin  125 mg Oral BID   Followed by   [facility] Hold] vancomycin  125 mg Oral Daily   Continuous Infusions:  sodium chloride Stopped (12/22/20 0816)   sodium chloride     sodium chloride     sodium chloride 1 mL/kg/hr (12/22/20 0814)   sodium chloride     heparin Stopped (12/22/20 0840)    Assessment/Plan:  NSTEMI with rising troponin.  This morning on heparin drip.  Patient had a successful PCI with DES to the proximal RCA.  Patient on aspirin and Brilinta.  Patient will be on 3 blood thinners for 2 weeks then aspirin will be stopped.  Patient on metoprolol and  Crestor.  LDL 45. Acute kidney injury chronic kidney disease stage IIIa.  Creatinine 1.68 on presentation down to 1.28 with IV fluid hydration.  Check creatinine tomorrow morning. Paroxysmal atrial fibrillation.  Was on heparin drip this morning.  Can convert over to Eliquis this afternoon.  Continue propafenone and metoprolol. C. difficile colitis on vancomycin taper Type 2 diabetes mellitus with hyperlipidemia unspecified.  On Crestor.  Continue to hold Glucophage with cardiac cath today.  Sliding scale insulin. Essential hypertension on metoprolol, Imdur.  Holding ACE inhibitor with acute kidney injury. BPH on Proscar Anemia, iron deficiency.  Hemoglobin 10.4 with IV fluid hydration today     Code Status:     Code Status Orders  (From admission, onward)           Start     Ordered   12/21/20 0707  Full code  Continuous        12/21/20 0706           Code Status History      Date Active Date Inactive Code Status Order ID Comments User Context   07/19/2018 2014 07/23/2018 1325 Full Code [id number]  Hillary Bow, MD ED   04/29/2018 1258 04/30/2018 1940 Full Code [id number]  Kary Kos, MD Inpatient   06/27/2017 1807 07/02/2017 1333 Full Code [id number]  Kary Kos, MD Inpatient   09/13/2016 1214 09/14/2016 1723 Full Code [id number]  Kary Kos, MD Inpatient      Advance Directive Documentation    Flowsheet Row Most Recent Value  Type of Advance Directive Healthcare Power of Attorney, Living will  Pre-existing out of facility DNR order (yellow form or pink MOST form) --  "MOST" Form in Place? --      Family Communication: Declined Disposition Plan: Status is: Inpatient  Consultants: Cardiology  Procedures: Cardiac cath  Time spent: 27 minutes  [place]

## 2020-12-22 NOTE — Progress Notes (Signed)
Initial Nutrition Assessment  DOCUMENTATION CODES:  Non-severe (moderate) malnutrition in context of chronic illness  INTERVENTION:  Add Magic cup TID with meals, each supplement provides 290 kcal and 9 grams of protein.  Add MVI with minerals daily.  Encourage PO and supplement intake.  NUTRITION DIAGNOSIS:  Moderate Malnutrition related to chronic illness (PVD) as evidenced by moderate fat depletion, moderate muscle depletion.  GOAL:  Patient will meet greater than or equal to 90% of their needs  MONITOR:  PO intake, Supplement acceptance, Labs, Weight trends, I & O's  REASON FOR ASSESSMENT:  Malnutrition Screening Tool    ASSESSMENT:  80 yo male with a PMH of  diabetes, paroxysmal A. fib, peripheral vascular disease, anemia of chronic disease, anxiety disorder, osteoarthritis, GERD, essential hypertension, hyperlipidemia, obstructive sleep apnea on CPAP who presented to the ER with chest pain. Pt now with NSTEMI. Also admitted with C.diff. 111/9 - cardiac cath  Spoke with pt at bedside. Pt reports that he has been eating well at home and he likes the food being served to him here.  He also reports no changes in his weight, but with his neuropathy, he reports having difficulty moving around, which has caused his muscles to "sag."  Muscle depletion can be felt on exam.  Per Epic, pt's weight has been stable for the past 3.5 months.  Recommend adding Magic Cup TID and MVI with minerals daily. Pt does not like Ensure and similar supplements.  Medications: reviewed; SSI, Vancomycin PO BID, NaCl @ 75 ml/hr  Labs: reviewed; CBG 138-172 (H) HbA1c: 6.6% (12/21/2020)  NUTRITION - FOCUSED PHYSICAL EXAM: Flowsheet Row Most Recent Value  Orbital Region Moderate depletion  Upper Arm Region Severe depletion  Thoracic and Lumbar Region No depletion  Buccal Region Mild depletion  Temple Region Moderate depletion  Clavicle Bone Region Mild depletion  Clavicle and Acromion Bone  Region Mild depletion  Scapular Bone Region Moderate depletion  Dorsal Hand Mild depletion  Patellar Region Moderate depletion  Anterior Thigh Region Severe depletion  Posterior Calf Region Moderate depletion  Edema (RD Assessment) None  Hair Reviewed  Eyes Reviewed  Mouth Reviewed  Skin Reviewed  Nails Reviewed   Diet Order:   Diet Order             Diet Heart Room service appropriate? Yes; Fluid consistency: Thin  Diet effective now                  EDUCATION NEEDS:  Education needs have been addressed  Skin:  Skin Assessment: Reviewed RN Assessment  Last BM:  12/21/20  Height:  Ht Readings from Last 1 Encounters:  12/20/20 6\' 1"  (1.854 m)   Weight:  Wt Readings from Last 1 Encounters:  12/20/20 87 kg   BMI:  Body mass index is 25.3 kg/m.  Estimated Nutritional Needs:  Kcal:  1900-2100 Protein:  105-120 grams Fluid:  >1.9 L  Derrel Nip, RD, LDN (she/her/hers) Clinical Inpatient Dietitian RD Pager/After-Hours/Weekend Pager # in [place]

## 2020-12-22 NOTE — Consult Note (Signed)
ANTICOAGULATION CONSULT NOTE   Pharmacy Consult for Heparin Infusion Indication: chest pain/ACS  Allergies  Allergen Reactions   Levaquin [Levofloxacin In D5w] Anaphylaxis and Shortness Of Breath   Shellfish Allergy Anaphylaxis    Has used duraprep, betadine and ioban in previous surgeries in 2019 and 2018 without issue   Amiodarone Other (See Comments)    Tremors and thyroid toxicity   Adhesive [Tape] Other (See Comments)    Little red bumps under the dressing.  He questions whether is latex related    Patient Measurements: Height: 6\' 1"  (185.4 cm) Weight: 87 kg (191 lb 12.8 oz) IBW/kg (Calculated) : 79.9 Heparin Dosing Weight: 87 kg  Vital Signs: Temp: 97.9 F (36.6 C) (11/09 0006) Temp Source: Oral (11/09 0006) BP: 140/89 (11/09 0006) Pulse Rate: 71 (11/09 0006)  Labs: Recent Labs    12/20/20 2011 12/21/20 1324 12/21/20 0949 12/21/20 1127 12/21/20 1828 12/22/20 0410  HGB 11.1*  --   --   --   --  10.4*  HCT 33.3*  --   --   --   --  31.0*  PLT 151  --   --   --   --  157  APTT  --   --  34  --  99* 118*  LABPROT  --   --  15.0  --   --   --   INR  --   --  1.2  --   --   --   HEPARINUNFRC  --   --  >1.10*  --   --  >1.10*  CREATININE 1.68*  --   --   --   --  1.28*  TROPONINIHS 18* 719*  --  839*  --   --      Estimated Creatinine Clearance: 53.8 mL/min (A) (by C-G formula based on SCr of 1.28 mg/dL (H)).   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 Troponin level 18 and 719. Plan for cardiac catheterization with selective coronary arteriography on 12/22/20. Last apixaban dose was taken on 11/7@~2000. Baseline labs are ordered and pending.  11/8 1828 aPTT 99  11/9 0410 aPTT = 118, HL = > 1.10  Goal of Therapy:  Heparin level 0.3-0.7 units/ml once aPTT and heparin correlate.  aPTT 66-102 seconds Monitor platelets by anticoagulation protocol: Yes   Plan:  11/9 @ 0410:  aPTT = 118, HL > 1.10 aPTT is supratherapeutic,  HL remains elevated from prior Eliquis dose Will continue to use aPTT to guide dosing. Will decrease heparin drip to 950 units/hr.  Will recheck aPTT 8 hrs after rate change. Will recheck HL on 11/10 with AM labs.   Alida Greiner D, PharmD, BCPS 12/22/2020,5:34 AM

## 2020-12-23 ENCOUNTER — Other Ambulatory Visit: Payer: Self-pay

## 2020-12-23 DIAGNOSIS — D509 Iron deficiency anemia, unspecified: Secondary | ICD-10-CM

## 2020-12-23 LAB — BASIC METABOLIC PANEL
Anion gap: 5 (ref 5–15)
BUN: 16 mg/dL (ref 8–23)
CO2: 25 mmol/L (ref 22–32)
Calcium: 8.4 mg/dL — ABNORMAL LOW (ref 8.9–10.3)
Chloride: 105 mmol/L (ref 98–111)
Creatinine, Ser: 1.17 mg/dL (ref 0.61–1.24)
GFR, Estimated: >60 mL/min (ref >60)
Glucose, Bld: 126 mg/dL — ABNORMAL HIGH (ref 70–99)
Potassium: 3.5 mmol/L (ref 3.5–5.1)
Sodium: 135 mmol/L (ref 135–145)

## 2020-12-23 LAB — CBC
HCT: 30.5 % — ABNORMAL LOW (ref 39.0–52.0)
Hemoglobin: 10.3 g/dL — ABNORMAL LOW (ref 13.0–17.0)
MCH: 30.7 pg (ref 26.0–34.0)
MCHC: 33.8 g/dL (ref 30.0–36.0)
MCV: 90.8 fL (ref 80.0–100.0)
Platelets: 153 10*3/uL (ref 150–400)
RBC: 3.36 MIL/uL — ABNORMAL LOW (ref 4.22–5.81)
RDW: 13.1 % (ref 11.5–15.5)
WBC: 7.1 10*3/uL (ref 4.0–10.5)
nRBC: 0 % (ref 0.0–0.2)

## 2020-12-23 LAB — GLUCOSE, CAPILLARY: Glucose-Capillary: 133 mg/dL — ABNORMAL HIGH (ref 70–99)

## 2020-12-23 MED ORDER — APIXABAN 5 MG PO TABS
5.0000 mg | ORAL_TABLET | Freq: Two times a day (BID) | ORAL | Status: DC
  Administered 2020-12-23: 5 mg via ORAL
  Filled 2020-12-23: qty 1

## 2020-12-23 MED ORDER — TICAGRELOR 90 MG PO TABS: 90.0000 mg | ORAL_TABLET | Freq: Two times a day (BID) | ORAL | 0 refills | Status: DC

## 2020-12-23 MED ORDER — ASPIRIN 81 MG PO CHEW: 81.0000 mg | CHEWABLE_TABLET | Freq: Every day | ORAL | 0 refills | Status: AC

## 2020-12-23 MED ORDER — POTASSIUM CHLORIDE CRYS ER 20 MEQ PO TBCR
40.0000 meq | EXTENDED_RELEASE_TABLET | Freq: Once | ORAL | Status: AC
  Administered 2020-12-23: 40 meq via ORAL
  Filled 2020-12-23: qty 2

## 2020-12-23 NOTE — Discharge Summary (Signed)
[facility] NAME: Sean Reyes    MR#:  [id number]  DATE OF BIRTH:  1940-08-20  DATE OF ADMISSION:  12/20/2020 ADMITTING PHYSICIAN: Elwyn Reach, MD  DATE OF DISCHARGE: 12/23/2020 12:32 PM  PRIMARY CARE PHYSICIAN: [name], NP    ADMISSION DIAGNOSIS:  Unstable angina ([place]) [I20.0] NSTEMI (non-ST elevated myocardial infarction) ([place]) [I21.4] Chest pain, unspecified type [R07.9]  DISCHARGE DIAGNOSIS:  NSTEMI  SECONDARY DIAGNOSIS:   Past Medical History:  Diagnosis Date   Anemia    Anxiety    Arthritis    Arthritis of neck    Atrial fibrillation (HCC)    Cataracts, bilateral    Complication of anesthesia    pt reports low BP's after surgery at [facility] and difficulty awakening   Depression    Diabetes ([place])    dx 6-8 yrs ago   Dysrhythmia    a-fib   GERD (gastroesophageal reflux disease)    OCC TAKES ALKA SELTZER   History of kidney stones    10-15 yrs ago   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  Initial troponin was normal but the second 2 troponins elevated.  Patient was started on heparin drip.  Dr. Lorinda Creed took to the cardiac Cath Lab and had access successful PCI with DES to the proximal RCA.  Cardiology recommends triple blood thinners with aspirin, Brilinta and Eliquis.  Aspirin will be discontinued after 2 weeks.  30-day discount card for Brilinta.  Patient on metoprolol and Crestor.  LDL 45.  Patient feels much better and will be discharged home in stable condition. Acute kidney injury on chronic kidney disease stage II.  Creatinine 1.68 on presentation the patient was given IV fluids.  Creatinine 1.17 after cardiac catheterization on discharge. Paroxysmal atrial  fibrillation.  The patient was restarted on Eliquis.  Continue propafenone and metoprolol for rhythm and rate control. C. difficile colitis on vancomycin taper Type 2 diabetes mellitus with hyperlipidemia unspecified.  Continue Crestor.  Continue to hold Glucophage after cardiac cath.  Can consider going back on Glucophage as outpatient. Essential hypertension on metoprolol and Imdur.  Held ACE inhibitor with acute kidney injury.  Can consider restarting as outpatient. BPH on Proscar Iron deficiency anemia.  Hemoglobin 10.3 upon discharge.  Being that the patient is on 3 blood thinners right now need to watch blood counts closely and for any signs of bleeding.  DISCHARGE CONDITIONS:   Satisfactory  CONSULTS OBTAINED:  Cardiology  DRUG ALLERGIES:   Allergies  Allergen Reactions   Levaquin [Levofloxacin In D5w] Anaphylaxis and Shortness Of Breath   Shellfish Allergy Anaphylaxis    Has used duraprep, betadine and ioban in previous surgeries in 2019 and 2018 without issue   Amiodarone Other (See Comments)    Tremors and thyroid toxicity   Adhesive [Tape] Other (See Comments)    Little red bumps under the dressing.  He questions whether is latex related    DISCHARGE MEDICATIONS:   Allergies as of 12/23/2020       Reactions   Levaquin [levofloxacin In D5w] Anaphylaxis, Shortness Of Breath   Shellfish Allergy Anaphylaxis   Has used duraprep,  betadine and ioban in previous surgeries in 2019 and 2018 without issue   Amiodarone Other (See Comments)   Tremors and thyroid toxicity   Adhesive [tape] Other (See Comments)   Little red bumps under the dressing.  He questions whether is latex related        Medication List     STOP taking these medications    benazepril 5 MG tablet Commonly known as: LOTENSIN   metFORMIN 500 MG 24 hr tablet Commonly known as: GLUCOPHAGE-XR   polyethylene glycol-electrolytes 420 g solution Commonly known as: NuLYTELY       TAKE these  medications    acetaminophen 500 MG tablet Commonly known as: TYLENOL Take 500 mg by mouth every 6 (six) hours as needed (pain).   aspirin 81 MG chewable tablet Chew 1 tablet (81 mg total) by mouth daily for 14 days.   Eliquis 5 MG Tabs tablet Generic drug: apixaban Take 1 tablet (5 mg total) by mouth 2 (two) times daily.   ferrous sulfate 325 (65 FE) MG tablet Take by mouth.   finasteride 5 MG tablet Commonly known as: PROSCAR Take 1 tablet (5 mg total) by mouth daily.   gabapentin 300 MG capsule Commonly known as: NEURONTIN Take 1 capsule (300 mg total) by mouth 2 (two) times daily.   isosorbide mononitrate 30 MG 24 hr tablet Commonly known as: IMDUR Take 30 mg by mouth daily.   metoprolol succinate 25 MG 24 hr tablet Commonly known as: TOPROL-XL Take 1 tablet by mouth daily.   pantoprazole 40 MG tablet Commonly known as: PROTONIX Take by mouth.   propafenone 425 MG 12 hr capsule Commonly known as: RYTHMOL SR Take 425 mg by mouth 2 (two) times daily.   rosuvastatin 5 MG tablet Commonly known as: CRESTOR Take 1 tablet (5 mg total) by mouth daily.   tamsulosin 0.4 MG Caps capsule Commonly known as: FLOMAX Take 0.8 mg by mouth daily.   ticagrelor 90 MG Tabs tablet Commonly known as: BRILINTA Take 1 tablet (90 mg total) by mouth 2 (two) times daily.   trimethoprim-polymyxin b ophthalmic solution Commonly known as: POLYTRIM Apply to eye.   vancomycin 125 MG capsule Commonly known as: VANCOCIN Take 1 capsule (125 mg total) by mouth 4 (four) times daily for 14 days, THEN 1 capsule (125 mg total) in the morning, at noon, and at bedtime for 21 days, THEN 1 capsule (125 mg total) in the morning and at bedtime for 14 days, THEN 1 capsule (125 mg total) daily for 7 days. Start taking on: November 17, 2020         DISCHARGE INSTRUCTIONS:   Follow-up PMD 5 days Follow-up cardiology 1 week Follow-up cardiac rehab  If you experience worsening of your admission  symptoms, develop shortness of breath, life threatening emergency, suicidal or homicidal thoughts you must seek medical attention immediately by calling 911 or calling your MD immediately  if symptoms less severe.  You Must read complete instructions/literature along with all the possible adverse reactions/side effects for all the Medicines you take and that have been prescribed to you. Take any new Medicines after you have completely understood and accept all the possible adverse reactions/side effects.   Please note  You were cared for by a hospitalist during your hospital stay. If you have any questions about your discharge medications or the care you received while you were in the hospital after you are discharged, you can call the unit and asked to speak with the  hospitalist on call if the hospitalist that took care of you is not available. Once you are discharged, your primary care physician will handle any further medical issues. Please note that NO REFILLS for any discharge medications will be authorized once you are discharged, as it is imperative that you return to your primary care physician (or establish a relationship with a primary care physician if you do not have one) for your aftercare needs so that they can reassess your need for medications and monitor your lab values.    Today   CHIEF COMPLAINT:   Chief Complaint  Patient presents with   Chest Pain   Neck Pain    HISTORY OF PRESENT ILLNESS:  Sean Reyes  is a 80 y.o. male came in with neck pain and chest pain   VITAL SIGNS:  Blood pressure (!) 165/79, pulse 63, temperature 97.8 F (36.6 C), resp. rate 18, height 6\' 1"  (1.854 m), weight 87 kg, SpO2 99 %.  I/O:   Intake/Output Summary (Last 24 hours) at 12/23/2020 1627 Last data filed at 12/23/2020 1000 Gross per 24 hour  Intake 240 ml  Output 725 ml  Net -485 ml    PHYSICAL EXAMINATION:  GENERAL:  80 y.o.-year-old patient lying in the bed with no acute  distress.  EYES: Pupils equal, round, reactive to light and accommodation. No scleral icterus.  HEENT: Head atraumatic, normocephalic. Oropharynx and nasopharynx clear.  LUNGS: Normal breath sounds bilaterally, no wheezing, rales,rhonchi or crepitation. No use of accessory muscles of respiration.  CARDIOVASCULAR: S1, S2 normal. No murmurs, rubs, or gallops.  ABDOMEN: Soft, non-tender, non-distended.  EXTREMITIES: No pedal edema.  NEUROLOGIC: Cranial nerves II through XII are intact. Muscle strength 5/5 in all extremities. Sensation intact. Gait not checked.  PSYCHIATRIC: The patient is alert and oriented x 3.  SKIN: No obvious rash, lesion, or ulcer.   DATA REVIEW:   CBC Recent Labs  Lab 12/23/20 0417  WBC 7.1  HGB 10.3*  HCT 30.5*  PLT 153    Chemistries  Recent Labs  Lab 12/23/20 0417  NA 135  K 3.5  CL 105  CO2 25  GLUCOSE 126*  BUN 16  CREATININE 1.17  CALCIUM 8.4*    Cardiac Enzymes High-sensitivity troponin went from 18 to 719 to 839.  Microbiology Results  Results for orders placed or performed during the hospital encounter of 12/20/20  Resp Panel by RT-PCR (Flu A&B, Covid) Nasopharyngeal Swab     Status: None   Collection Time: 12/20/20  9:07 PM   Specimen: Nasopharyngeal Swab; Nasopharyngeal(NP) swabs in vial transport medium  Result Value Ref Range Status   SARS Coronavirus 2 by RT PCR NEGATIVE NEGATIVE Final    Comment: (NOTE) SARS-CoV-2 target nucleic acids are NOT DETECTED.  The SARS-CoV-2 RNA is generally detectable in upper respiratory specimens during the acute phase of infection. The lowest concentration of SARS-CoV-2 viral copies this assay can detect is 138 copies/mL. A negative result does not preclude SARS-Cov-2 infection and should not be used as the sole basis for treatment or other patient management decisions. A negative result may occur with  improper specimen collection/handling, submission of specimen other than nasopharyngeal swab,  presence of viral mutation(s) within the areas targeted by this assay, and inadequate number of viral copies(<138 copies/mL). A negative result must be combined with clinical observations, patient history, and epidemiological information. The expected result is Negative.  Fact Sheet for Patients:  EntrepreneurPulse.com.au  Fact Sheet for Healthcare Providers:  IncredibleEmployment.be  This  test is no t yet approved or cleared by the [place] and  has been authorized for detection and/or diagnosis of SARS-CoV-2 by FDA under an Emergency Use Authorization (EUA). This EUA will remain  in effect (meaning this test can be used) for the duration of the COVID-19 declaration under Section 564(b)(1) of the Act, 21 U.S.C.section 360bbb-3(b)(1), unless the authorization is terminated  or revoked sooner.       Influenza A by PCR NEGATIVE NEGATIVE Final   Influenza B by PCR NEGATIVE NEGATIVE Final    Comment: (NOTE) The Xpert Xpress SARS-CoV-2/FLU/RSV plus assay is intended as an aid in the diagnosis of influenza from Nasopharyngeal swab specimens and should not be used as a sole basis for treatment. Nasal washings and aspirates are unacceptable for Xpert Xpress SARS-CoV-2/FLU/RSV testing.  Fact Sheet for Patients: EntrepreneurPulse.com.au  Fact Sheet for Healthcare Providers: IncredibleEmployment.be  This test is not yet approved or cleared by the [place] FDA and has been authorized for detection and/or diagnosis of SARS-CoV-2 by FDA under an Emergency Use Authorization (EUA). This EUA will remain in effect (meaning this test can be used) for the duration of the COVID-19 declaration under Section 564(b)(1) of the Act, 21 U.S.C. section 360bbb-3(b)(1), unless the authorization is terminated or revoked.  Performed at [facility], [address] [place], [place] [address]     RADIOLOGY:   CARDIAC CATHETERIZATION  Result Date: 12/22/2020   Prox RCA lesion is 99% stenosed.   Dist LAD lesion is 70% stenosed.   Mid LAD lesion is 50% stenosed.   Mid Cx to Dist Cx lesion is 80% stenosed.   A drug-eluting stent was successfully placed using a STENT ONYX FRONTIER [id number].   Post intervention, there is a 0% residual stenosis.   There is mild left ventricular systolic dysfunction.   The left ventricular ejection fraction is 45-50% by visual estimate. 1.  Non-ST elevation myocardial infarction 2.  Mildly reduced left ventricular function with LVEF 45 to 50% 3.  Three-vessel coronary artery disease with 70% stenosis very distal segment LAD, diffuse 80% stenosis small caliber AV groove left circumflex, and high-grade 99% stenosis proximal RCA 4.  Successful PCI with DES proximal RCA Recommendations 1.  Dual antiplatelet therapy 2.  Aggressive risk factor modification 3.  Continue metoprolol succinate 4.  Restart Eliquis on 12/23/2020, continue triple therapy for 2 weeks at which time we will discontinue aspirin as outpatient and continue Eliquis and Brilinta       Management plans discussed with the patient, and he is in agreement.  CODE STATUS:     Code Status Orders  (From admission, onward)           Start     Ordered   12/21/20 0707  Full code  Continuous        12/21/20 0706           Code Status History     Date Active Date Inactive Code Status Order ID Comments User Context   07/19/2018 2014 07/23/2018 1325 Full Code [id number]  Hillary Bow, MD ED   04/29/2018 1258 04/30/2018 1940 Full Code [id number]  Kary Kos, MD Inpatient   06/27/2017 1807 07/02/2017 1333 Full Code [id number]  Kary Kos, MD Inpatient   09/13/2016 1214 09/14/2016 1723 Full Code [id number]  Kary Kos, MD Inpatient      Advance Directive Documentation    Flowsheet Row Most Recent Value  Type of Advance Directive Healthcare Power of [place], Living will  Pre-existing out of facility DNR order (yellow form  or pink MOST form) --  "MOST" Form in Place? --       TOTAL TIME TAKING CARE OF THIS PATIENT: 35 minutes.    Loletha Grayer M.D on 12/23/2020 at 4:27 PM  [facility]  CC: Primary care physician; [name], NP

## 2020-12-23 NOTE — Care Management Important Message (Signed)
Important Message  Patient Details  Name: Drury Ardizzone. MRN: [id number] Date of Birth: 1940-04-14   Medicare Important Message Given:  Yes  Patient is in an isolation room so I talked with him by phone [phone]) and reviewed his Important Message from Medicare. He is in agreement with his discharge. I asked if he would like a copy of the form and he declined. I wished him well and thanked him for his time.  Juliann Pulse A Alannie Amodio 12/23/2020, 12:06 PM

## 2020-12-23 NOTE — Plan of Care (Signed)
  Problem: Education: Goal: Knowledge of General Education information will improve Description: Including pain rating scale, medication(s)/side effects and non-pharmacologic comfort measures Outcome: Adequate for Discharge   Problem: Health Behavior/Discharge Planning: Goal: Ability to manage health-related needs will improve Outcome: Adequate for Discharge   Problem: Clinical Measurements: Goal: Ability to maintain clinical measurements within normal limits will improve Outcome: Adequate for Discharge Goal: Will remain free from infection Outcome: Adequate for Discharge Goal: Diagnostic test results will improve Outcome: Adequate for Discharge Goal: Respiratory complications will improve Outcome: Adequate for Discharge Goal: Cardiovascular complication will be avoided Outcome: Adequate for Discharge   Problem: Activity: Goal: Risk for activity intolerance will decrease Outcome: Adequate for Discharge   Problem: Nutrition: Goal: Adequate nutrition will be maintained Outcome: Adequate for Discharge   Problem: Coping: Goal: Level of anxiety will decrease Outcome: Adequate for Discharge   Problem: Elimination: Goal: Will not experience complications related to bowel motility Outcome: Adequate for Discharge Goal: Will not experience complications related to urinary retention Outcome: Adequate for Discharge   Problem: Pain Managment: Goal: General experience of comfort will improve Outcome: Adequate for Discharge   Problem: Safety: Goal: Ability to remain free from injury will improve Outcome: Adequate for Discharge   Problem: Skin Integrity: Goal: Risk for impaired skin integrity will decrease Outcome: Adequate for Discharge   Problem: Education: Goal: Understanding of CV disease, CV risk reduction, and recovery process will improve Outcome: Adequate for Discharge Goal: Individualized Educational Video(s) Outcome: Adequate for Discharge   Problem:  Activity: Goal: Ability to return to baseline activity level will improve Outcome: Adequate for Discharge   Problem: Cardiovascular: Goal: Ability to achieve and maintain adequate cardiovascular perfusion will improve Outcome: Adequate for Discharge Goal: Vascular access site(s) Level 0-1 will be maintained Outcome: Adequate for Discharge   Problem: Health Behavior/Discharge Planning: Goal: Ability to safely manage health-related needs after discharge will improve Outcome: Adequate for Discharge  Pt dc home per MD order

## 2020-12-23 NOTE — Progress Notes (Signed)
[facility]  SUBJECTIVE: Patient laying in bed, reports doing well, denies chest pain or shortness of breath   Vitals:   12/22/20 2136 12/23/20 0019 12/23/20 0431 12/23/20 0759  BP: (!) 157/81 (!) 165/83 (!) 157/83 (!) 173/89  Pulse: 68 65 69 63  Resp: 18  16 18   Temp: 98.8 F (37.1 C) 98.4 F (36.9 C) 98.1 F (36.7 C) 97.8 F (36.6 C)  TempSrc: Oral Oral    SpO2: 99% 98% 97% 99%  Weight:      Height:         Intake/Output Summary (Last 24 hours) at 12/23/2020 0801 Last data filed at 12/23/2020 2683 Gross per 24 hour  Intake 1641.88 ml  Output 2425 ml  Net -783.12 ml      PHYSICAL EXAM  General: Well developed, well nourished, in no acute distress HEENT:  Normocephalic and atramatic Neck:  No JVD.  Lungs: Clear bilaterally to auscultation and percussion. Heart: HRRR . Normal S1 and S2 without gallops or murmurs.  Abdomen: Bowel sounds are positive, abdomen soft and non-tender  Msk:  Back normal, normal gait. Normal strength and tone for age. Extremities: No clubbing, cyanosis or edema.   Neuro: Alert and oriented X 3. Psych:  Good affect, responds appropriately   LABS: Basic Metabolic Panel: Recent Labs    12/22/20 0410 12/23/20 0417  NA 136 135  K 3.8 3.5  CL 102 105  CO2 26 25  GLUCOSE 132* 126*  BUN 19 16  CREATININE 1.28* 1.17  CALCIUM 8.8* 8.4*   Liver Function Tests: No results for input(s): AST, ALT, ALKPHOS, BILITOT, PROT, ALBUMIN in the last 72 hours. No results for input(s): LIPASE, AMYLASE in the last 72 hours. CBC: Recent Labs    12/22/20 0410 12/23/20 0417  WBC 7.2 7.1  HGB 10.4* 10.3*  HCT 31.0* 30.5*  MCV 91.4 90.8  PLT 157 153   Cardiac Enzymes: No results for input(s): CKTOTAL, CKMB, CKMBINDEX, TROPONINI in the last 72 hours. BNP: Invalid input(s): POCBNP D-Dimer: No results for input(s): DDIMER in the last 72 hours. Hemoglobin A1C: Recent Labs    12/21/20 0652  HGBA1C 6.6*   Fasting Lipid Panel: Recent Labs     12/21/20 0652  CHOL 94  HDL 36*  LDLCALC 45  TRIG 67  CHOLHDL 2.6   Thyroid Function Tests: No results for input(s): TSH, T4TOTAL, T3FREE, THYROIDAB in the last 72 hours.  Invalid input(s): FREET3 Anemia Panel: No results for input(s): VITAMINB12, FOLATE, FERRITIN, TIBC, IRON, RETICCTPCT in the last 72 hours.  CARDIAC CATHETERIZATION  Result Date: 12/22/2020   Prox RCA lesion is 99% stenosed.   Dist LAD lesion is 70% stenosed.   Mid LAD lesion is 50% stenosed.   Mid Cx to Dist Cx lesion is 80% stenosed.   A drug-eluting stent was successfully placed using a STENT ONYX FRONTIER 4.0X18.   Post intervention, there is a 0% residual stenosis.   There is mild left ventricular systolic dysfunction.   The left ventricular ejection fraction is 45-50% by visual estimate. 1.  Non-ST elevation myocardial infarction 2.  Mildly reduced left ventricular function with LVEF 45 to 50% 3.  Three-vessel coronary artery disease with 70% stenosis very distal segment LAD, diffuse 80% stenosis small caliber AV groove left circumflex, and high-grade 99% stenosis proximal RCA 4.  Successful PCI with DES proximal RCA Recommendations 1.  Dual antiplatelet therapy 2.  Aggressive risk factor modification 3.  Continue metoprolol succinate 4.  Restart Eliquis on 12/23/2020,  continue triple therapy for 2 weeks at which time we will discontinue aspirin as outpatient and continue Eliquis and Brilinta   ECHOCARDIOGRAM COMPLETE  Result Date: 12/21/2020    ECHOCARDIOGRAM REPORT   Patient Name:   Sean Reyes. Date of Exam: 12/21/2020 Medical Rec #:  [id number]        Height:       73.0 in Accession #:    [id number]       Weight:       191.8 lb Date of Birth:  [birth year]        BSA:          2.114 m Patient Age:    80 years         BP:           157/89 mmHg Patient Gender: M                HR:           56 bpm. Exam Location:  [facility] Procedure: 2D Echo, Color Doppler and Cardiac Doppler Indications:     Acute myocardial infarction  -unspecified I21.9  History:         Patient has no prior history of Echocardiogram examinations.                  Arrythmias:Atrial Fibrillation; Risk Factors:Hypertension,                  Diabetes and Dyslipidemia.  Sonographer:     Sherrie Sport Referring Phys:  [place] Diagnosing Phys: [name] MD IMPRESSIONS  1. Left ventricular ejection fraction, by estimation, is 65 to 70%. The left ventricle has normal function. The left ventricle has no regional wall motion abnormalities. There is mild left ventricular hypertrophy. Left ventricular diastolic parameters were normal.  2. Right ventricular systolic function is normal. The right ventricular size is normal.  3. The mitral valve is normal in structure. Trivial mitral valve regurgitation. No evidence of mitral stenosis.  4. The aortic valve is normal in structure. Aortic valve regurgitation is not visualized. No aortic stenosis is present.  5. The inferior vena cava is normal in size with greater than 50% respiratory variability, suggesting right atrial pressure of 3 mmHg. FINDINGS  Left Ventricle: Left ventricular ejection fraction, by estimation, is 65 to 70%. The left ventricle has normal function. The left ventricle has no regional wall motion abnormalities. The left ventricular internal cavity size was normal in size. There is  mild left ventricular hypertrophy. Left ventricular diastolic parameters were normal. Right Ventricle: The right ventricular size is normal. No increase in right ventricular wall thickness. Right ventricular systolic function is normal. Left Atrium: Left atrial size was normal in size. Right Atrium: Right atrial size was normal in size. Pericardium: There is no evidence of pericardial effusion. Mitral Valve: The mitral valve is normal in structure. Trivial mitral valve regurgitation. No evidence of mitral valve stenosis. MV peak gradient, 3.3 mmHg. The mean mitral valve gradient is 1.0 mmHg. Tricuspid Valve:  The tricuspid valve is normal in structure. Tricuspid valve regurgitation is trivial. No evidence of tricuspid stenosis. Aortic Valve: The aortic valve is normal in structure. Aortic valve regurgitation is not visualized. No aortic stenosis is present. Aortic valve mean gradient measures 2.0 mmHg. Aortic valve peak gradient measures 3.7 mmHg. Aortic valve area, by VTI measures 3.50 cm. Pulmonic Valve: The pulmonic valve was normal in structure. Pulmonic valve regurgitation is not visualized. No evidence of pulmonic stenosis.  Aorta: The aortic root is normal in size and structure. Venous: The inferior vena cava is normal in size with greater than 50% respiratory variability, suggesting right atrial pressure of 3 mmHg. IAS/Shunts: No atrial level shunt detected by color flow Doppler.  LEFT VENTRICLE PLAX 2D LVIDd:         4.43 cm   Diastology LVIDs:         2.82 cm   LV e' medial:    7.18 cm/s LV PW:         1.43 cm   LV E/e' medial:  10.7 LV IVS:        1.29 cm   LV e' lateral:   11.40 cm/s LVOT diam:     2.00 cm   LV E/e' lateral: 6.8 LV SV:         58 LV SV Index:   27 LVOT Area:     3.14 cm  RIGHT VENTRICLE RV Basal diam:  3.17 cm RV S prime:     15.80 cm/s TAPSE (M-mode): 4.8 cm LEFT ATRIUM             Index        RIGHT ATRIUM           Index LA diam:        4.10 cm 1.94 cm/m   RA Area:     21.40 cm LA Vol (A2C):   82.0 ml 38.79 ml/m  RA Volume:   63.50 ml  30.04 ml/m LA Vol (A4C):   54.7 ml 25.88 ml/m LA Biplane Vol: 73.8 ml 34.92 ml/m  AORTIC VALVE                    PULMONIC VALVE AV Area (Vmax):    2.82 cm     PV Vmax:        0.79 m/s AV Area (Vmean):   3.14 cm     PV Vmean:       53.500 cm/s AV Area (VTI):     3.50 cm     PV VTI:         0.148 m AV Vmax:           96.10 cm/s   PV Peak grad:   2.5 mmHg AV Vmean:          62.400 cm/s  PV Mean grad:   1.0 mmHg AV VTI:            0.166 m      RVOT Peak grad: 3 mmHg AV Peak Grad:      3.7 mmHg AV Mean Grad:      2.0 mmHg LVOT Vmax:         86.20 cm/s  LVOT Vmean:        62.400 cm/s LVOT VTI:          0.185 m LVOT/AV VTI ratio: 1.11  AORTA Ao Root diam: 3.87 cm MITRAL VALVE               TRICUSPID VALVE MV Area (PHT): 3.33 cm    TR Peak grad:   12.7 mmHg MV Area VTI:   2.30 cm    TR Vmax:        178.00 cm/s MV Peak grad:  3.3 mmHg MV Mean grad:  1.0 mmHg    SHUNTS MV Vmax:       0.90 m/s    Systemic VTI:  0.18 m MV Vmean:  54.6 cm/s   Systemic Diam: 2.00 cm MV Decel Time: 228 msec    Pulmonic VTI:  0.145 m MV E velocity: 77.10 cm/s MV A velocity: 56.10 cm/s MV E/A ratio:  1.37 [name] MD Electronically signed by [name] MD Signature Date/Time: 12/21/2020/2:59:34 PM    Final      Echo LVEF 65 to 70%  TELEMETRY: Sinus rhythm:  ASSESSMENT AND PLAN:  Principal Problem:   Unstable angina (HCC) Active Problems:   Essential hypertension   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HCC)   AF (paroxysmal atrial fibrillation) (HCC)   Type 2 diabetes mellitus with hyperlipidemia (HCC)   Benign prostatic hyperplasia without lower urinary tract symptoms   Anemia   CKD (chronic kidney disease) stage 3, GFR 30-59 ml/min (HCC)   Acquired thrombophilia (HCC)   Peripheral vascular disease (HCC)   NSTEMI (non-ST elevated myocardial infarction) (HCC)   Malnutrition of moderate degree    1.  NSTEMI, high-sensitivity troponin 18 and 719 2.  Coronary artery disease, cardiac catheterization revealed 70% stenosis very distal segment of LAD, diffuse 80% stenosis and very small caliber AV groove left circumflex, high-grade 99% stenosis in large dominant proximal RCA.  Patient underwent successful PCI receiving 4.0 x 18 mm Onyx Frontier drug-eluting stent proximal RCA via right radial artery approach. 3.  Paroxysmal atrial fibrillation, status post catheter ablation x2, on Eliquis for stroke prevention, and propafenone for rate and rhythm control 4.  Essential hypertension, initial blood pressure markedly elevated, now much  improved 5.  Hyperlipidemia, on rosuvastatin 6.  Type 2 diabetes 7.  Chronic kidney disease, stable following cardiac catheterization   Recommendations  1.  Agree with current therapy 2.  Continue dual antiplatelet therapy 3.  Resume Eliquis 5 mg twice daily 4.  Stop aspirin in 2 to 3 weeks as outpatient 5.  Resume rosuvastatin, consider uptitrating dose which can be done as outpatient 6.  May discharge home later today 7.  Follow-up with Dr. Nehemiah Massed in 1 to 2 weeks   [name], MD, PhD, [facility] 12/23/2020 8:01 AM

## 2020-12-24 ENCOUNTER — Ambulatory Visit: Payer: Medicare Other

## 2020-12-28 ENCOUNTER — Ambulatory Visit (INDEPENDENT_AMBULATORY_CARE_PROVIDER_SITE_OTHER): Payer: Medicare Other

## 2020-12-28 ENCOUNTER — Telehealth: Payer: Medicare Other

## 2020-12-28 DIAGNOSIS — I10 Essential (primary) hypertension: Secondary | ICD-10-CM | POA: Diagnosis not present

## 2020-12-28 DIAGNOSIS — E1143 Type 2 diabetes mellitus with diabetic autonomic (poly)neuropathy: Secondary | ICD-10-CM

## 2020-12-28 DIAGNOSIS — Z9689 Presence of other specified functional implants: Secondary | ICD-10-CM

## 2020-12-28 DIAGNOSIS — E1169 Type 2 diabetes mellitus with other specified complication: Secondary | ICD-10-CM

## 2020-12-28 DIAGNOSIS — E785 Hyperlipidemia, unspecified: Secondary | ICD-10-CM

## 2020-12-28 DIAGNOSIS — I480 Paroxysmal atrial fibrillation: Secondary | ICD-10-CM | POA: Diagnosis not present

## 2020-12-28 DIAGNOSIS — I2510 Atherosclerotic heart disease of native coronary artery without angina pectoris: Secondary | ICD-10-CM | POA: Insufficient documentation

## 2020-12-28 DIAGNOSIS — G894 Chronic pain syndrome: Secondary | ICD-10-CM

## 2020-12-28 DIAGNOSIS — E1159 Type 2 diabetes mellitus with other circulatory complications: Secondary | ICD-10-CM

## 2020-12-28 DIAGNOSIS — M79661 Pain in right lower leg: Secondary | ICD-10-CM

## 2020-12-28 DIAGNOSIS — I214 Non-ST elevation (NSTEMI) myocardial infarction: Secondary | ICD-10-CM

## 2020-12-28 DIAGNOSIS — G4733 Obstructive sleep apnea (adult) (pediatric): Secondary | ICD-10-CM | POA: Diagnosis not present

## 2020-12-28 DIAGNOSIS — E782 Mixed hyperlipidemia: Secondary | ICD-10-CM | POA: Diagnosis not present

## 2020-12-28 NOTE — Chronic Care Management (AMB) (Signed)
Chronic Care Management   CCM RN Visit Note  12/28/2020 Name: Sean Reyes. MRN: [id number] DOB: August 01, 1940  Subjective: Sean Reyes. is a 80 y.o. year old male who is a primary care patient of [name], [name], NP. The care management team was consulted for assistance with disease management and care coordination needs.    Engaged with patient by telephone for follow up visit in response to provider referral for case management and/or care coordination services.   Consent to Services:  The patient was given information about Chronic Care Management services, agreed to services, and gave verbal consent prior to initiation of services.  Please see initial visit note for detailed documentation.   Patient agreed to services and verbal consent obtained.   Assessment: Review of patient past medical history, allergies, medications, health status, including review of consultants reports, laboratory and other test data, was performed as part of comprehensive evaluation and provision of chronic care management services.   SDOH (Social Determinants of Health) assessments and interventions performed:    CCM Care Plan  Allergies  Allergen Reactions   Levaquin [Levofloxacin In D5w] Anaphylaxis and Shortness Of Breath   Shellfish Allergy Anaphylaxis    Has used duraprep, betadine and ioban in previous surgeries in 2019 and 2018 without issue   Amiodarone Other (See Comments)    Tremors and thyroid toxicity   Adhesive [Tape] Other (See Comments)    Little red bumps under the dressing.  He questions whether is latex related    Outpatient Encounter Medications as of 12/28/2020  Medication Sig   acetaminophen (TYLENOL) 500 MG tablet Take 500 mg by mouth every 6 (six) hours as needed (pain).   aspirin 81 MG chewable tablet Chew 1 tablet (81 mg total) by mouth daily for 14 days.   ELIQUIS 5 MG TABS tablet Take 1 tablet (5 mg total) by mouth 2 (two) times daily.   ferrous sulfate 325 (65  FE) MG tablet Take by mouth.   finasteride (PROSCAR) 5 MG tablet Take 1 tablet (5 mg total) by mouth daily.   gabapentin (NEURONTIN) 300 MG capsule Take 1 capsule (300 mg total) by mouth 2 (two) times daily.   isosorbide mononitrate (IMDUR) 30 MG 24 hr tablet Take 30 mg by mouth daily.   metoprolol succinate (TOPROL-XL) 25 MG 24 hr tablet Take 1 tablet by mouth daily.   pantoprazole (PROTONIX) 40 MG tablet Take by mouth.   propafenone (RYTHMOL SR) 425 MG 12 hr capsule Take 425 mg by mouth 2 (two) times daily.   rosuvastatin (CRESTOR) 5 MG tablet Take 1 tablet (5 mg total) by mouth daily.   tamsulosin (FLOMAX) 0.4 MG CAPS capsule Take 0.8 mg by mouth daily.   ticagrelor (BRILINTA) 90 MG TABS tablet Take 1 tablet (90 mg total) by mouth 2 (two) times daily.   trimethoprim-polymyxin b (POLYTRIM) ophthalmic solution Apply to eye.   vancomycin (VANCOCIN) 125 MG capsule Take 1 capsule (125 mg total) by mouth 4 (four) times daily for 14 days, THEN 1 capsule (125 mg total) in the morning, at noon, and at bedtime for 21 days, THEN 1 capsule (125 mg total) in the morning and at bedtime for 14 days, THEN 1 capsule (125 mg total) daily for 7 days.   No facility-administered encounter medications on file as of 12/28/2020.    Patient Active Problem List   Diagnosis Date Noted   Malnutrition of moderate degree 12/22/2020   NSTEMI (non-ST elevated myocardial infarction) ([place]) 12/21/2020  Acute kidney injury superimposed on CKD (HCC)    C. 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   Postlaminectomy syndrome, lumbar region 01/16/2019   History of fusion of lumbar spine (L2-L5) 01/16/2019   Chronic radicular lumbar pain 01/16/2019   HNP (herniated nucleus pulposus), lumbar 04/29/2018   Advanced care planning/counseling discussion 09/28/2016   Spinal stenosis, lumbar region, 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/13/2016   Hyperlipidemia associated with type 2 diabetes mellitus ([place]) 07/14/2015   Anemia 06/30/2015   Benign prostatic hyperplasia without lower urinary tract symptoms 06/02/2015   OSA (obstructive sleep apnea) 03/23/2015   Type 2 diabetes mellitus with hyperlipidemia ([place]) 01/05/2015   Essential hypertension 09/28/2014   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[place]) 09/28/2014   H/O prior ablation treatment 10/19/2011   AF (paroxysmal atrial fibrillation) ([place]) 10/19/2011    Conditions to be addressed/monitored:HTN, HLD, DMII, and chronic pain and recent hospitalization for NSTEMI  Care Plan : RNCM: Hypertension (Adult)  Updates made by Vanita Ingles, RN since 12/28/2020 12:00 AM  Completed 12/28/2020   Problem: RNCM: Hypertension (Hypertension) Resolved 12/28/2020  Priority: Medium     Long-Range Goal: RNCM: Hypertension Monitored Completed 12/28/2020  Start Date: 03/16/2020  Expected End Date: 08/02/2021  Recent Progress: On track  Priority: Medium  Note:   Objective: Resolving, duplicate goal  Last practice recorded BP readings:  BP Readings from Last 3 Encounters:  11/01/20 (!) 161/87  10/25/20 130/76  09/29/20 130/73     Most recent eGFR/CrCl: No results found for: EGFR  No components found for: CRCL Current Barriers:  Knowledge Deficits related to basic understanding of hypertension pathophysiology and self care management Knowledge Deficits related to understanding of medications prescribed for management of hypertension Unable to independently manage HTN Unable to perform IADLs independently- with  back pain exacerbations sometimes he cannot do IADLs Does not contact provider office for questions/concerns Case Manager Clinical Goal(s):  patient will verbalize understanding of plan for hypertension management patient will attend all scheduled medical appointments: 12-10-2020 at 10 am. The patient also has appointments with vascular specialist patient will demonstrate improved adherence to prescribed treatment plan for hypertension as evidenced by taking all medications as prescribed, monitoring and recording blood pressure as directed, adhering to low sodium/DASH diet patient will demonstrate improved health management independence as evidenced by checking blood pressure as directed and notifying PCP if SBP>160 or DBP > 90, taking all medications as prescribe, and adhering to a low sodium diet as discussed.  patient will verbalize basic understanding of hypertension disease process and self health management plan as evidenced by compliance with heart healthy diet, compliance to medications, and working with the CCM team to maintain health and well being.  Interventions:  Collaboration with [name], NP regarding development and update of comprehensive plan of care as evidenced by provider attestation and co-signature Inter-disciplinary care team collaboration (see longitudinal plan of care) Evaluation of current treatment plan related to hypertension self management and patient's adherence to plan as established by provider. 05-25-2020: The patient is having some concerns with low  blood pressure. States sometimes he is having readings of 100/50 with light headedness and dizziness. Sometimes he does not take his medications or cuts it in half. Denies light headed or dizziness today. Has follow up with pcp on 05-26-2020: Encouraged the patient to discuss with the pcp about changes. 08-03-2020: Patient states his blood pressure "goes up and down".  Denies any light headedness or dizziness.   Yesterday, patient had an episode where his heart rate dropped to 45/min.  He was symptomatic - absolutely no energy.  Patient has a watch that tracks his heart rate so this is how he learned of his heart rate dropping to 45/min.  He reports his heart rate is typically in the mid 50's - 60's/min. I recommended he make an appointment with his cardiologist soon to address his heart rate and possible medication adjustments and move up his scheduled PCP appointment to address heart rate, blood pressure, possible medication adjustments and an area on his toe that his daughter wants assessed - especially with patient being diabetic.  Patient agreed. 11-02-2020: The patient states his blood pressure is "erratic". The patient states in the am it is usually good but afternoon to night it can be in the 170's over 90's. States most recently it was 170/96. Review of goal of <024 systolic and <09 diastolic. The patient states he is out of his benazepril 40 mg and wanted to know if the pcp could send in a refill? Will collaborate with the pcp for recommendations for patient request. The patient denies any headaches, flushed skin, lightheadedness or dizziness. Ask the patient to write down values for the pcp to have on hand at upcoming visit in October. Verbalized he would start doing this. Will continue to monitor.  Provided education to patient re: stroke prevention, s/s of heart attack and stroke, DASH diet, complications of uncontrolled blood pressure. 05-26-2020: Education on systolic range of 735-HG 992 and diastolic 60 to 90. Discussed safety when feeling light headed and dizziness. 11-02-2020: Reviewed with the patient when systolic is >426 and diastolic >83 it puts the patient at increased risk of heart attack and stroke. Also review of pain causing the blood pressure to go up. Review of safety and to monitor for changes in condition. Headaches, light headedness, dizziness, or flushed skin. The patient has a good  understanding of his conditions and how to effectively manage.  Reviewed medications with patient and discussed importance of compliance. 05-25-2020: The patient sometimes has cut his medication in half or not taken due to low blood pressure. 11-02-2020:  Patient is compliant with medications.  Patient states he is taking Metoprolol 25 mg 1 tab currently. He needs a refill for benazepril 40 mg daily. Will collaborate with the pcp for refill request.  Discussed plans with patient for ongoing care management follow up and provided patient with direct contact information for care management team Advised patient, providing education and rationale, to monitor blood pressure daily and record, calling PCP for findings outside established parameters. 11-02-2020: The patient encouraged to write down questions to ask the pcp on visit in October,  especially parameters for taking medications and keeping a log of readings for pcp to review Reviewed scheduled/upcoming provider appointments including: 12-10-2020 at 10 am Patient Goals/Self-Care Activities  patient will:  - Self administers medications as prescribed Attends all scheduled provider appointments Calls provider office for new concerns, questions, or BP outside discussed parameters Checks BP and records as discussed Follows a low sodium diet/DASH diet - blood pressure  trends reviewed - depression screen reviewed - home or ambulatory blood pressure monitoring encouraged Follow Up Plan: Telephone follow up appointment with care management team member scheduled for: 12-28-2020 at 9:00 am    Care Plan : RNCM: Chronic Pain (Adult)  Updates made by Vanita Ingles, RN since 12/28/2020 12:00 AM  Completed 12/28/2020   Problem: RNCM: Pain Management Plan (Chronic Pain) Resolved 12/28/2020  Priority: High     Long-Range Goal: RNCM: Pain Management Plan Developed Completed 12/28/2020  Start Date: 03/16/2020  Expected End Date: 08/02/2021  Recent Progress:  Not on track  Priority: High  Note:   Current Barriers: Resolving, duplicate goal  Knowledge Deficits related to managing acute/chronic pain Non-adherence to scheduled provider appointments Non-adherence to prescribed medication regimen Difficulty obtaining medications Chronic Disease Management support and education needs related to chronic pain Unable to independently manage pain exacerbations, has a pain stiumlator Unable to perform IADLs independently Does not contact provider office for questions/concerns  Nurse Case Manager Clinical Goal(s):   patient will verbalize understanding of plan for managing pain patient will attend all scheduled medical appointments: 09-09-2020  patient will demonstrate use of different relaxation  skills and/or diversional activities to assist with pain reduction (distraction, imagery, relaxation, massage, acupressure, TENS, heat, and cold application  patient will report pain at a level less than 3 to 4 on a 10-10 rating scale  patient will use pharmacological and nonpharmacological pain relief strategies patient will verbalize acceptable level of pain relief and ability to engage in desired activities patient will engage in desired activities without an increase in pain level  Interventions:  Collaboration with [name], NP regarding development and update of comprehensive plan of care as evidenced by provider attestation and co-signature Inter-disciplinary care team collaboration (see longitudinal plan of care) - deep breathing, relaxation and mindfulness use promoted - effectiveness of pharmacologic therapy monitored. 05-25-2020: The patient is scared to take pain medications because of the experience his daughter has had with the pain medications she has taken and is addicted to it. The patient says the gabapentin is not effective in helping with pain. He is unsure what to do and is waiting for xray results. 11-02-2020: The patient has been  working with the pain provider and is also scheduled to see a vascular provider this month. The patient states his back pain is stable and the stimulator is working the best it can. The right shin pain is causing him a lot of issues and his legs and back are weak. He is going to start going to [employer] to walk on the treadmill and also lift weights. Feels that if he works muscles this will help him a lot. He has worked with PT in the past and it helped some. Uses his "crutch" when ambulating and denies falls. Reviewed safety concerns due to pain and weakness. The patient states he is pacing his activity. Will continue to monitor for changes.  - motivation and barriers to change assessed and addressed. 11-02-2020: The patient wants to feel better because he wants to be active and enjoy the things he likes to do.  - pain assessed- 05-25-2020: The patient states his pain is worse. He has had xrays and is waiting for the results. The stimulator may have become dislodged. Will discuss with the specialist. Will continue to monitor. 11-02-2020: Pain is stable in his back. Worse pain to right shin. States that he is unsure what is going on with this. Stems from an old injury  many years ago. The patient along with the pain doctor feel like it has something to do with the nerve endings. The patient will see a vascular provider this month and is hopeful for answers instead of hearing its something he has to live with. Will continue to monitor.  - pain treatment goals reviewed. 11-02-2020: The patient wants his pain under control so he can do the things he likes to do - participation in physical therapy encouraged. 11-02-2020: Has worked with PT in the past.  - premedication prior to activity encouraged Evaluation of current treatment plan related to chronic pain and patient's adherence to plan as established by provider. 05-25-2020: The patient will see pcp tomorrow. The patient is concerned as the pain stimulator is not  working as well as he had hoped. He is hopeful the xrays will show what seems to be the issue. 08-03-2020:  Patient had cervical MRI done recently. 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  Patient states his pain is a "7 or 8" with walking.  He says walking is difficult at times.  Patient has pain stimulator and thinks a change in it's settings is needed possibly. Per chart review, a bilateral C4, C5, C6 medial branch nerve block has been ordered and patient has agreed to more forward with this procedure. 11-02-2020: The patient is working with pain specialist and will see a vascular provider. Still having lost of issues when he is ambulating. States his back does not hurt. The biggest area of pain right now is his right shin. Will continue to monitor for changes.  Advised patient to contact the pain specialist and discuss new onset of pain and see if the pain stimulator needs to be adjusted.  08-03-2020: Patient thinks his pain stimulator needs an adjustment in it's settings for better pain management. 11-02-2020; The patient states that he is working with the manufacturer for adjustment in the pain stimulator.  Provided education to patient re: review of recent activity and safety.  Patient verbalized current symptoms started on Sunday. He is having to use his cane at this time due to pain exacerbation.  Reviewed medications with patient and discussed compliance. 11-02-2020:  Patient is compliant with medications. Discussed plans with patient for ongoing care management follow up and provided patient with direct contact information for care management team Allow patient to maintain a diary of pain ratings, timing, precipitating events, medications, treatments, and what works best to relieve pain,  Refer to support groups and self-help groups Educate patient about the use of pharmacological interventions for pain management- antianxiety, antidepressants, NSAIDS, opioid  analgesics,  Explain the importance of lifestyle modifications to effective pain management   Patient Goals/Self Care Activities:  Patient verbalizes understanding of plan to effectively manage pain and discomfort  Self-administers medications as prescribed Attends all scheduled provider appointments Calls pharmacy for medication refills Calls provider office for new concerns or questions - mutually acceptable comfort goal set - pain assessed - pain treatment goals reviewed - patient response to treatment assessed - sharing of pain management plan with teachers and other caregivers encouraged  Follow Up Plan: Telephone follow up appointment with care management team member scheduled for: 12-28-2020 at 9:00 am      Care Plan : RNCM: Diabetes Type 2 (Adult)  Updates made by Vanita Ingles, RN since 12/28/2020 12:00 AM  Completed 12/28/2020   Problem: RNCM: Disease Progression (Diabetes, Type 2) Resolved 12/28/2020  Priority: Medium     Long-Range Goal: RNCM:  Disease Progression Prevented or Minimized Completed 12/28/2020  Start Date: 11/02/2020  Expected End Date: 11/02/2021  Recent Progress: On track  Priority: Medium  Note:   Objective: Resolving, duplicate goal Lab Results  Component Value Date   HGBA1C 6.6 09/09/2020   Lab Results  Component Value Date   CREATININE 1.38 (H) 09/09/2020   CREATININE 1.29 (H) 06/10/2020   CREATININE 1.25 02/26/2020   Lab Results  Component Value Date   EGFR 52 (L) 09/09/2020   Current Barriers:  Knowledge Deficits related to basic Diabetes pathophysiology and self care/management Knowledge Deficits related to medications used for management of diabetes Unable to independently manage DM Use of steroids at time due to chronic pain issues Case Manager Clinical Goal(s):  patient will demonstrate improved adherence to prescribed treatment plan for diabetes self care/management as evidenced by: daily monitoring and recording of CBG   adherence to ADA/ carb modified diet exercise 4 days/week adherence to prescribed medication regimen contacting provider for new or worsened symptoms or questions Interventions:  Collaboration with [name], NP regarding development and update of comprehensive plan of care as evidenced by provider attestation and co-signature Inter-disciplinary care team collaboration (see longitudinal plan of care) Provided education to patient about basic DM disease process Reviewed medications with patient and discussed importance of medication adherence Discussed plans with patient for ongoing care management follow up and provided patient with direct contact information for care management team Provided patient with written educational materials related to hypo and hyperglycemia and importance of correct treatment Reviewed scheduled/upcoming provider appointments including: 12-10-2020 at 10 am Advised patient, providing education and rationale, to check cbg daily  and record, calling pcp for findings outside established parameters.   Review of patient status, including review of consultants reports, relevant laboratory and other test results, and medications completed. Self-Care Activities - Self administers oral medications as prescribed Attends all scheduled provider appointments Checks blood sugars as prescribed and utilize hyper and hypoglycemia protocol as needed Adheres to prescribed ADA/carb modified Patient Goals: - check blood sugar at prescribed times - check blood sugar before and after exercise - check blood sugar if I feel it is too high or too low - enter blood sugar readings and medication or insulin into daily log - take the blood sugar log to all doctor visits - change to whole grain breads, cereal, pasta - drink 6 to 8 glasses of water each day - eat fish at least once per week - fill half of plate with vegetables - join a weight loss program - keep a food diary - limit  fast food meals to no more than 1 per week - manage portion size - prepare main meal at home 3 to 5 days each week - read food labels for fat, fiber, carbohydrates and portion size - reduce red meat to 2 to 3 times a week - switch to sugar-free drinks - schedule appointment with eye doctor - check feet daily for cuts, sores or redness - keep feet up while sitting - trim toenails straight across - wash and dry feet carefully every day - wear comfortable, cotton socks - wear comfortable, well-fitting shoes Follow Up Plan: Telephone follow up appointment with care management team member scheduled for: 12-28-2020 at 0900 am     Care Plan : RNCM: General Plan of Care (Adult) for Chronic Disease Management and Care Coordination Needs  Updates made by Vanita Ingles, RN since 12/28/2020 12:00 AM     Problem: RNCM: Development of  Plan of Care for Chronic Disease Management (DM, HTN, HLD, Chronic Pain, Recent HA)   Priority: High     Long-Range Goal: RNCM: Effective Management  of Plan of Care for Chronic Disease Management (DM, HTN, HLD, Chronic Pain, Recent HA)   Start Date: 12/28/2020  Expected End Date: 12/28/2021  Priority: High  Note:   Current Barriers:  Knowledge Deficits related to plan of care for management of HTN, HLD, DMII, and Chronic pain   Chronic Disease Management support and education needs related to HTN, HLD, DMII, and Chronic pain   RNCM Clinical Goal(s):  Patient will verbalize understanding of plan for management of HTN, HLD, DMII, and Chronic pain as evidenced by compliance with plan of care, calling the office for changes, and working with the CCM team to effectively manage health and well being  take all medications exactly as prescribed and will call provider for medication related questions as evidenced by compliance with medications and calling for refills before running out    attend all scheduled medical appointments: 01-10-2021 at 340 pm as evidenced by  keeping appointments and calling for rescheduling needs         demonstrate improved and ongoing health management independence as evidenced by working with the CCM team to effectively manage health and well being         demonstrate a decrease in HTN, HLD, DMII, and chronic pain  exacerbations  as evidenced by stable conditions, no acute changes, and working with the CCM team to effectively manage health and well being  demonstrate ongoing self health care management ability for effective management of chronic condtions  as evidenced by working with the CCM team  through collaboration with Consulting civil engineer, provider, and care team.   Interventions: 1:1 collaboration with primary care provider regarding development and update of comprehensive plan of care as evidenced by provider attestation and co-signature Inter-disciplinary care team collaboration (see longitudinal plan of care) Evaluation of current treatment plan related to  self management and patient's adherence to plan as established by provider   SDOH Barriers (Status: Goal on Track (progressing): YES.) Long Term Goal  Patient interviewed and SDOH assessment performed        Patient interviewed and appropriate assessments performed Provided patient with information about resources available in the community and care guides to assist with changes in SDOH, or new needs  Discussed plans with patient for ongoing care management follow up and provided patient with direct contact information for care management team Advised patient to call the office for changes in [place], questions, or concerns    Diabetes:  (Status: Goal on Track (progressing): YES.) Long Term Goal   Lab Results  Component Value Date   HGBA1C 6.6 (H) 12/21/2020  Assessed patient's understanding of A1c goal: <7% Provided education to patient about basic DM disease process; Reviewed medications with patient and discussed importance of medication adherence;         Reviewed prescribed diet with patient Heart Healthy/ADA diet; Counseled on importance of regular laboratory monitoring as prescribed;        Discussed plans with patient for ongoing care management follow up and provided patient with direct contact information for care management team;      Provided patient with written educational materials related to hypo and hyperglycemia and importance of correct treatment;       Reviewed scheduled/upcoming provider appointments including: 01-10-2021 at 340 pm;         Advised patient, providing education and  rationale, to check cbg as directed  and record        call provider for findings outside established parameters;       Review of patient status, including review of consultants reports, relevant laboratory and other test results, and medications completed;        Hyperlipidemia:  (Status: Goal on Track (progressing): YES.) Long Term Goal  Lab Results  Component Value Date   CHOL 94 12/21/2020   HDL 36 (L) 12/21/2020   LDLCALC 45 12/21/2020   TRIG 67 12/21/2020   CHOLHDL 2.6 12/21/2020     Medication review performed; medication list updated in electronic medical record.  Provider established cholesterol goals reviewed; Counseled on importance of regular laboratory monitoring as prescribed; Provided HLD educational materials; Reviewed role and benefits of statin for ASCVD risk reduction; Discussed strategies to manage statin-induced myalgias; Reviewed importance of limiting foods high in cholesterol; Reviewed exercise goals and target of 150 minutes per week; Screening for signs and symptoms of depression related to chronic disease state;  Assessed social determinant of health barriers;   Hypertension: (Status: Goal on Track (progressing): YES.) Last practice recorded BP readings:  BP Readings from Last 3 Encounters:  12/23/20 (!) 165/79  12/10/20 132/80  12/01/20 140/82  Most recent eGFR/CrCl:  Lab Results  Component Value Date    EGFR 52 (L) 12/10/2020    No components found for: CRCL  Evaluation of current treatment plan related to hypertension self management and patient's adherence to plan as established by provider;   Provided education to patient re: stroke prevention, s/s of heart attack and stroke; Reviewed prescribed diet heart healthy/ADA diet  Reviewed medications with patient and discussed importance of compliance;  Discussed plans with patient for ongoing care management follow up and provided patient with direct contact information for care management team; Advised patient, providing education and rationale, to monitor blood pressure daily and record, calling PCP for findings outside established parameters;  Advised patient to discuss recent hospitalization  with provider; Provided education on prescribed diet heart healthy diet ;  Discussed complications of poorly controlled blood pressure such as heart disease, stroke, circulatory complications, vision complications, kidney impairment, sexual dysfunction;  Evaluation of post discharge from the hospital due to a NSTEMI. The patient was hospitalized from 12-20-2020 to 12-23-2020. The patient underwent heart catheterization and had a stent placed. The patient states that he is doing well. The patient will follow up with the PA for cardiology today. The patient is concerned as he had went to the doctor about the chest pain he was having. Explained the risk for increased incidence of heart attack and stroke with the multiple chronic conditions the patient has. Also discussed how lifestyle choices, hereditary factors and other things may contribute to higher risk. The patient is concerned over his children. Education and support given.   Pain:  (Status: Goal on Track (progressing): YES.) Long Term Goal  Pain assessment performed. 12-28-2020: The patient states that he is still having the right shin pain and it is very debilitating when it occurs. He plans to talk to  pcp on upcoming visit. He currently rates it at a 10 when it occurs. His back pain is at a good level today. Rates at a 3/4 today on a scale of 0-10. States the stimulator seems to be on the right setting. 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 The patient denies any falls and is using his crutch for  better stability. Empathetic listening and support given. He discuss his concerns with pcp at upcoming visit on 01-10-2021 at 340 pm Medications reviewed Reviewed provider established plan for pain management; Discussed importance of adherence to all scheduled medical appointments; Counseled on the importance of reporting any/all new or changed pain symptoms or management strategies to pain management provider; Advised patient to report to care team affect of pain on daily activities; Discussed use of relaxation techniques and/or diversional activities to assist with pain reduction (distraction, imagery, relaxation, massage, acupressure, TENS, heat, and cold application; Reviewed with patient prescribed pharmacological and nonpharmacological pain relief strategies; Advised patient to discuss ongoing right shin pain that is getting worse with provider;   Patient Goals/Self-Care Activities: Patient will self administer medications as prescribed as evidenced by self report/primary caregiver report  Patient will attend all scheduled provider appointments as evidenced by clinician review of documented attendance to scheduled appointments and patient/caregiver report Patient will call pharmacy for medication refills as evidenced by patient report and review of pharmacy fill history as appropriate Patient will attend church or other social activities as evidenced by patient report Patient will continue to perform ADL's independently as evidenced by patient/caregiver report Patient will continue to perform IADL's independently as evidenced by  patient/caregiver report Patient will call provider office for new concerns or questions as evidenced by review of documented incoming telephone call notes and patient report Patient will work with BSW to address care coordination needs and will continue to work with the clinical team to address health care and disease management related needs as evidenced by documented adherence to scheduled care management/care coordination appointments schedule appointment with eye doctor check blood sugar at prescribed times: when you have symptoms of low or high blood sugar, before and after exercise, and as directed by provider   check feet daily for cuts, sores or redness enter blood sugar readings and medication or insulin into daily log take the blood sugar log to all doctor visits trim toenails straight across drink 6 to 8 glasses of water each day eat fish at least once per week fill half of plate with vegetables limit fast food meals to no more than 1 per week manage portion size prepare main meal at home 3 to 5 days each week read food labels for fat, fiber, carbohydrates and portion size reduce red meat to 2 to 3 times a week do heel pump exercise 2 to 3 times each day keep feet up while sitting wash and dry feet carefully every day wear comfortable, cotton socks wear comfortable, well-fitting shoes - check blood pressure 3 times per week - choose a place to take my blood pressure (home, clinic or office, retail store) - write blood pressure results in a log or diary - learn about high blood pressure - keep a blood pressure log - take blood pressure log to all doctor appointments - call doctor for signs and symptoms of high blood pressure - develop an action plan for high blood pressure - keep all doctor appointments - take medications for blood pressure exactly as prescribed - report new symptoms to your doctor - eat more whole grains, fruits and vegetables, lean meats and healthy  fats - call for medicine refill 2 or 3 days before it runs out - take all medications exactly as prescribed - call doctor with any symptoms you believe are related to your medicine - call doctor when you experience any new symptoms - go to all doctor appointments as scheduled -  adhere to prescribed diet: Heart healthy/ADA       Plan:Telephone follow up appointment with care management team member scheduled for:  02-08-2021 at [place] am  Noreene Larsson RN, MSN, [facility] Mobile: [phone]

## 2020-12-28 NOTE — Patient Instructions (Signed)
Visit Information  ( Current Barriers:  Knowledge Deficits related to plan of care for management of HTN, HLD, DMII, and Chronic pain   Chronic Disease Management support and education needs related to HTN, HLD, DMII, and Chronic pain   RNCM Clinical Goal(s):  Patient will verbalize understanding of plan for management of HTN, HLD, DMII, and Chronic pain as evidenced by compliance with plan of care, calling the office for changes, and working with the CCM team to effectively manage health and well being  take all medications exactly as prescribed and will call provider for medication related questions as evidenced by compliance with medications and calling for refills before running out    attend all scheduled medical appointments: 01-10-2021 at 340 pm as evidenced by keeping appointments and calling for rescheduling needs         demonstrate improved and ongoing health management independence as evidenced by working with the CCM team to effectively manage health and well being         demonstrate a decrease in HTN, HLD, DMII, and chronic pain  exacerbations  as evidenced by stable conditions, no acute changes, and working with the CCM team to effectively manage health and well being  demonstrate ongoing self health care management ability for effective management of chronic condtions  as evidenced by working with the CCM team  through collaboration with Consulting civil engineer, provider, and care team.   Interventions: 1:1 collaboration with primary care provider regarding development and update of comprehensive plan of care as evidenced by provider attestation and co-signature Inter-disciplinary care team collaboration (see longitudinal plan of care) Evaluation of current treatment plan related to  self management and patient's adherence to plan as established by provider   SDOH Barriers (Status: Goal on Track (progressing): YES.) Long Term Goal  Patient interviewed and SDOH assessment performed         Patient interviewed and appropriate assessments performed Provided patient with information about resources available in the community and care guides to assist with changes in SDOH, or new needs  Discussed plans with patient for ongoing care management follow up and provided patient with direct contact information for care management team Advised patient to call the office for changes in [place], questions, or concerns    Diabetes:  (Status: Goal on Track (progressing): YES.) Long Term Goal   Lab Results  Component Value Date   HGBA1C 6.6 (H) 12/21/2020  Assessed patient's understanding of A1c goal: <7% Provided education to patient about basic DM disease process; Reviewed medications with patient and discussed importance of medication adherence;        Reviewed prescribed diet with patient Heart Healthy/ADA diet; Counseled on importance of regular laboratory monitoring as prescribed;        Discussed plans with patient for ongoing care management follow up and provided patient with direct contact information for care management team;      Provided patient with written educational materials related to hypo and hyperglycemia and importance of correct treatment;       Reviewed scheduled/upcoming provider appointments including: 01-10-2021 at 340 pm;         Advised patient, providing education and rationale, to check cbg as directed  and record        call provider for findings outside established parameters;       Review of patient status, including review of consultants reports, relevant laboratory and other test results, and medications completed;        Hyperlipidemia:  (Status:  Goal on Track (progressing): YES.) Long Term Goal  Lab Results  Component Value Date   CHOL 94 12/21/2020   HDL 36 (L) 12/21/2020   LDLCALC 45 12/21/2020   TRIG 67 12/21/2020   CHOLHDL 2.6 12/21/2020     Medication review performed; medication list updated in electronic medical record.  Provider  established cholesterol goals reviewed; Counseled on importance of regular laboratory monitoring as prescribed; Provided HLD educational materials; Reviewed role and benefits of statin for ASCVD risk reduction; Discussed strategies to manage statin-induced myalgias; Reviewed importance of limiting foods high in cholesterol; Reviewed exercise goals and target of 150 minutes per week; Screening for signs and symptoms of depression related to chronic disease state;  Assessed social determinant of health barriers;   Hypertension: (Status: Goal on Track (progressing): YES.) Last practice recorded BP readings:  BP Readings from Last 3 Encounters:  12/23/20 (!) 165/79  12/10/20 132/80  12/01/20 140/82  Most recent eGFR/CrCl:  Lab Results  Component Value Date   EGFR 52 (L) 12/10/2020    No components found for: CRCL  Evaluation of current treatment plan related to hypertension self management and patient's adherence to plan as established by provider;   Provided education to patient re: stroke prevention, s/s of heart attack and stroke; Reviewed prescribed diet heart healthy/ADA diet  Reviewed medications with patient and discussed importance of compliance;  Discussed plans with patient for ongoing care management follow up and provided patient with direct contact information for care management team; Advised patient, providing education and rationale, to monitor blood pressure daily and record, calling PCP for findings outside established parameters;  Advised patient to discuss recent hospitalization  with provider; Provided education on prescribed diet heart healthy diet ;  Discussed complications of poorly controlled blood pressure such as heart disease, stroke, circulatory complications, vision complications, kidney impairment, sexual dysfunction;  Evaluation of post discharge from the hospital due to a NSTEMI. The patient was hospitalized from 12-20-2020 to 12-23-2020. The patient  underwent heart catheterization and had a stent placed. The patient states that he is doing well. The patient will follow up with the PA for cardiology today. The patient is concerned as he had went to the doctor about the chest pain he was having. Explained the risk for increased incidence of heart attack and stroke with the multiple chronic conditions the patient has. Also discussed how lifestyle choices, hereditary factors and other things may contribute to higher risk. The patient is concerned over his children. Education and support given.   Pain:  (Status: Goal on Track (progressing): YES.) Long Term Goal  Pain assessment performed. 12-28-2020: The patient states that he is still having the right shin pain and it is very debilitating when it occurs. He plans to talk to pcp on upcoming visit. He currently rates it at a 10 when it occurs. His back pain is at a good level today. Rates at a 3/4 today on a scale of 0-10. States the stimulator seems to be on the right setting. 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 The patient denies any falls and is using his crutch for better stability. Empathetic listening and support given. He discuss his concerns with pcp at upcoming visit on 01-10-2021 at 340 pm Medications reviewed Reviewed provider established plan for pain management; Discussed importance of adherence to all scheduled medical appointments; Counseled on the importance of reporting any/all new or changed pain symptoms or management strategies to pain management  provider; Advised patient to report to care team affect of pain on daily activities; Discussed use of relaxation techniques and/or diversional activities to assist with pain reduction (distraction, imagery, relaxation, massage, acupressure, TENS, heat, and cold application; Reviewed with patient prescribed pharmacological and nonpharmacological pain relief strategies; Advised patient to  discuss ongoing right shin pain that is getting worse with provider;   Patient Goals/Self-Care Activities: Patient will self administer medications as prescribed as evidenced by self report/primary caregiver report  Patient will attend all scheduled provider appointments as evidenced by clinician review of documented attendance to scheduled appointments and patient/caregiver report Patient will call pharmacy for medication refills as evidenced by patient report and review of pharmacy fill history as appropriate Patient will attend church or other social activities as evidenced by patient report Patient will continue to perform ADL's independently as evidenced by patient/caregiver report Patient will continue to perform IADL's independently as evidenced by patient/caregiver report Patient will call provider office for new concerns or questions as evidenced by review of documented incoming telephone call notes and patient report Patient will work with BSW to address care coordination needs and will continue to work with the clinical team to address health care and disease management related needs as evidenced by documented adherence to scheduled care management/care coordination appointments schedule appointment with eye doctor check blood sugar at prescribed times: when you have symptoms of low or high blood sugar, before and after exercise, and as directed by provider   check feet daily for cuts, sores or redness enter blood sugar readings and medication or insulin into daily log take the blood sugar log to all doctor visits trim toenails straight across drink 6 to 8 glasses of water each day eat fish at least once per week fill half of plate with vegetables limit fast food meals to no more than 1 per week manage portion size prepare main meal at home 3 to 5 days each week read food labels for fat, fiber, carbohydrates and portion size reduce red meat to 2 to 3 times a week do heel pump  exercise 2 to 3 times each day keep feet up while sitting wash and dry feet carefully every day wear comfortable, cotton socks wear comfortable, well-fitting shoes - check blood pressure 3 times per week - choose a place to take my blood pressure (home, clinic or office, retail store) - write blood pressure results in a log or diary - learn about high blood pressure - keep a blood pressure log - take blood pressure log to all doctor appointments - call doctor for signs and symptoms of high blood pressure - develop an action plan for high blood pressure - keep all doctor appointments - take medications for blood pressure exactly as prescribed - report new symptoms to your doctor - eat more whole grains, fruits and vegetables, lean meats and healthy fats - call for medicine refill 2 or 3 days before it runs out - take all medications exactly as prescribed - call doctor with any symptoms you believe are related to your medicine - call doctor when you experience any new symptoms - go to all doctor appointments as scheduled - adhere to prescribed diet: Heart healthy/ADA       Patient verbalizes understanding of instructions provided today and agrees to view in [place].   Telephone follow up appointment with care management team member scheduled for: 02-08-2021 at [place] am  Noreene Larsson RN, MSN, [place]  [facility] Mobile: [phone]

## 2020-12-29 ENCOUNTER — Telehealth: Payer: Self-pay

## 2020-12-29 NOTE — Telephone Encounter (Signed)
Transition Care Management Follow-up Telephone Call Date of discharge and from where: 12/23/20 from [facility] How have you been since you were released from the hospital? Not feeling well today, don't feel like doing much at all. RNCM encouraged patient to follow up with cardiologist for questions/concerns.  Any questions or concerns? Yes-see above.  Items Reviewed: Did the pt receive and understand the discharge instructions provided? Yes  Medications obtained and verified? Yes  Other? No  Any new allergies since your discharge? No  Dietary orders reviewed? Yes Do you have support at home? Yes   Home Care and Equipment/Supplies: Were home health services ordered? no If so, what is the name of the agency? Not applicable  Has the agency set up a time to come to the patient's home? not applicable Were any new equipment or medical supplies ordered?  No What is the name of the medical supply agency? no Were you able to get the supplies/equipment? not applicable Do you have any questions related to the use of the equipment or supplies? No  Functional Questionnaire: (I = Independent and D = Dependent) ADLs: I  Bathing/Dressing- I  Meal Prep- I  Eating- I  Maintaining continence- I  Transferring/Ambulation- I  Managing Meds- I  Follow up appointments reviewed:  PCP Hospital f/u appt confirmed? Yes  Scheduled to see Marnee Guarneri, NP on 01/10/21 @ 3:40 pm . [facility] f/u appt confirmed? Yes  Scheduled to see Dr. Nehemiah Massed on 01/11/21 at [place]. Was seen by cardiology Dr. Alveria Apley PA on seen yesterday 12/28/20. Are transportation arrangements needed? no If their condition worsens, is the pt aware to call PCP or go to the Emergency Dept.? Yes Was the patient provided with contact information for the PCP's office or ED? Yes Was the patient encouraged to call back with questions or concerns? Yes     Thea Silversmith, RN, MSN, BSN, [place] Care Management  Coordinator [phone]

## 2020-12-31 ENCOUNTER — Other Ambulatory Visit: Payer: Self-pay

## 2020-12-31 ENCOUNTER — Encounter: Payer: Self-pay | Admitting: Nurse Practitioner

## 2020-12-31 ENCOUNTER — Ambulatory Visit (INDEPENDENT_AMBULATORY_CARE_PROVIDER_SITE_OTHER): Payer: Medicare Other | Admitting: Nurse Practitioner

## 2020-12-31 ENCOUNTER — Encounter: Payer: Medicare Other | Attending: Cardiology | Admitting: *Deleted

## 2020-12-31 VITALS — BP 138/68 | HR 64 | Wt 190.8 lb

## 2020-12-31 DIAGNOSIS — E538 Deficiency of other specified B group vitamins: Secondary | ICD-10-CM | POA: Diagnosis not present

## 2020-12-31 DIAGNOSIS — E1159 Type 2 diabetes mellitus with other circulatory complications: Secondary | ICD-10-CM

## 2020-12-31 DIAGNOSIS — I480 Paroxysmal atrial fibrillation: Secondary | ICD-10-CM

## 2020-12-31 DIAGNOSIS — D509 Iron deficiency anemia, unspecified: Secondary | ICD-10-CM

## 2020-12-31 DIAGNOSIS — I152 Hypertension secondary to endocrine disorders: Secondary | ICD-10-CM

## 2020-12-31 DIAGNOSIS — E1143 Type 2 diabetes mellitus with diabetic autonomic (poly)neuropathy: Secondary | ICD-10-CM

## 2020-12-31 DIAGNOSIS — A0472 Enterocolitis due to Clostridium difficile, not specified as recurrent: Secondary | ICD-10-CM | POA: Diagnosis not present

## 2020-12-31 DIAGNOSIS — N1831 Chronic kidney disease, stage 3a: Secondary | ICD-10-CM

## 2020-12-31 DIAGNOSIS — I25110 Atherosclerotic heart disease of native coronary artery with unstable angina pectoris: Secondary | ICD-10-CM | POA: Diagnosis not present

## 2020-12-31 DIAGNOSIS — I252 Old myocardial infarction: Secondary | ICD-10-CM | POA: Diagnosis not present

## 2020-12-31 DIAGNOSIS — I214 Non-ST elevation (NSTEMI) myocardial infarction: Secondary | ICD-10-CM

## 2020-12-31 DIAGNOSIS — G4733 Obstructive sleep apnea (adult) (pediatric): Secondary | ICD-10-CM | POA: Diagnosis not present

## 2020-12-31 DIAGNOSIS — Z955 Presence of coronary angioplasty implant and graft: Secondary | ICD-10-CM

## 2020-12-31 NOTE — Progress Notes (Signed)
Initial telephone orientation completed. Diagnosis can be found in [facility] 11/7. EP orientation scheduled for Wednesday 12/7 at 11am.

## 2020-12-31 NOTE — Assessment & Plan Note (Signed)
Continue Vancomycin until full completed.

## 2020-12-31 NOTE — Assessment & Plan Note (Signed)
Onoging issue with recent GI assessment performed, has upcoming visit with hematology as was recommended by GI.  Patient on Eliquis.  CBC, iron/ferritin, and B12 today.

## 2020-12-31 NOTE — Assessment & Plan Note (Signed)
Chronic, ongoing.  Continue current medication regimen and collaboration with cardiology.  Monitor CBC closely due to anemia.

## 2020-12-31 NOTE — Assessment & Plan Note (Signed)
On 12/20/20 -- at this time continue collaboration with cardiology and current medication regimen, is to stop ASA next week.

## 2020-12-31 NOTE — Assessment & Plan Note (Signed)
Referral to [facility] placed per request.

## 2020-12-31 NOTE — Patient Instructions (Signed)
Coronary Artery Disease, Male Coronary artery disease (CAD) is a condition in which the arteries that lead to the heart (coronary arteries) become narrow or blocked. The narrowing or blockage can lead to decreased blood flow to the heart. Prolonged reduced blood flow can cause a heart attack (myocardial infarction or MI). This condition may also be called coronary heart disease. Because CAD is the leading cause of death in men, it is important to understand what causes this condition and how it is treated. What are the causes? CAD is most often caused by atherosclerosis. This is the buildup of fat and cholesterol (plaque) on the inside of the arteries. Over time, the plaque may narrow or block the artery, reducing blood flow to the heart. Plaque can also become weak and break off within a coronary artery and cause a sudden blockage. Other less common causes of CAD include: A blood clot or a piece of a blood clot or other substance that blocks the flow of blood in a coronary artery (embolism). A tearing of the artery (spontaneous coronary artery dissection). An enlargement of an artery (aneurysm). Inflammation (vasculitis) in the artery wall. What increases the risk? The following factors may make you more likely to develop this condition: Age. Men over age 64 are at a greater risk of CAD. Family history of CAD. Gender. Men often develop CAD earlier in life than women. High blood pressure (hypertension). Diabetes. High cholesterol levels. Tobacco use. Excessive alcohol use. Lack of exercise. A diet high in saturated and trans fats, such as fried food and processed meat. Other possible risk factors include: High stress levels. Depression. Obesity. Sleep apnea. What are the signs or symptoms? Many people do not have any symptoms during the early stages of CAD. As the condition progresses, symptoms may include: Chest pain (angina). The pain can: Feel like crushing or squeezing, or like a  tightness, pressure, fullness, or heaviness in the chest. Last more than a few minutes or can stop and recur. The pain tends to get worse with exercise or stress and to fade with rest. Pain in the arms, neck, jaw, ear, or back. Unexplained heartburn or indigestion. Shortness of breath. Nausea or vomiting. Sudden light-headedness. Sudden cold sweats. Fluttering or fast heartbeat (palpitations). How is this diagnosed? This condition is diagnosed based on: Your family and medical history. A physical exam. Tests, including: A test to check the electrical signals in your heart (electrocardiogram). Exercise stress test. This looks for signs of blockage when the heart is stressed with exercise, such as running on a treadmill. Pharmacologic stress test. This test looks for signs of blockage when the heart is being stressed with a medicine. Blood tests. Coronary angiogram. This is a procedure to look at the coronary arteries to see if there is any blockage. During this test, a dye is injected into your arteries so they appear on an X-ray. Coronary artery CT scan. This CT scan helps detect calcium deposits in your coronary arteries. Calcium deposits are an indicator of CAD. A test that uses sound waves to take a picture of your heart (echocardiogram). Chest X-ray. How is this treated? This condition may be treated by: Healthy lifestyle changes to reduce risk factors. Medicines such as: Antiplatelet medicines and blood-thinning medicines, such as aspirin. These help to prevent blood clots. Nitroglycerin. Blood pressure medicines. Cholesterol-lowering medicine. Coronary angioplasty and stenting. During this procedure, a thin, flexible tube is inserted through a blood vessel and into a blocked artery. A balloon or similar device on  the end of the tube is inflated to open up the artery. In some cases, a small, mesh tube (stent) is inserted into the artery to keep it open. Coronary artery bypass  surgery. During this surgery, veins or arteries from other parts of the body are used to create a bypass around the blockage and allow blood to reach your heart. Follow these instructions at home: Medicines Take over-the-counter and prescription medicines only as told by your health care provider. Do not take the following medicines unless your health care provider approves: NSAIDs, such as ibuprofen, naproxen, or celecoxib. Vitamin supplements that contain vitamin A, vitamin E, or both. Lifestyle Follow an exercise program approved by your health care provider. Aim for 150 minutes of moderate exercise or 75 minutes of vigorous exercise each week. Maintain a healthy weight or lose weight as approved by your health care provider. Learn to manage stress or try to limit your stress. Ask your health care provider for suggestions if you need help. Get screened for depression and seek treatment, if needed. Do not use any products that contain nicotine or tobacco, such as cigarettes, e-cigarettes, and chewing tobacco. If you need help quitting, ask your health care provider. Do not use illegal drugs. Eating and drinking  Follow a heart-healthy diet. A dietitian can help educate you about healthy food options and changes. In general, eat plenty of fruits and vegetables, lean meats, and whole grains. Avoid foods high in: Sugar. Salt (sodium). Saturated fat, such as processed or fatty meat. Trans fat, such as fried foods. Use healthy cooking methods such as roasting, grilling, broiling, baking, poaching, steaming, or stir-frying. Do not drink alcohol if your health care provider tells you not to drink. If you drink alcohol: Limit how much you have to 0-2 drinks per day. Be aware of how much alcohol is in your drink. In the [place] one drink equals one 12 oz bottle of beer (355 mL), one 5 oz glass of wine (148 mL), or one 1 oz glass of hard liquor (44 mL). General instructions Manage any other health  conditions, such as hypertension and diabetes. These conditions affect your heart. Your health care provider may ask you to monitor your blood pressure. Ideally, your blood pressure should be below 130/80. Keep all follow-up visits as told by your health care provider. This is important. Get help right away if: You have pain in your chest, neck, ear, arm, jaw, stomach, or back that: Lasts more than a few minutes. Is recurring. Is not relieved by taking medicine under your tongue (sublingual nitroglycerin). You have profuse sweating without cause. You have unexplained: Heartburn or indigestion. Shortness of breath or difficulty breathing. Fluttering or fast heartbeat (palpitations). Nausea or vomiting. Fatigue. Feelings of nervousness or anxiety. Weakness. Diarrhea. You have sudden light-headedness or dizziness. You faint. You feel like hurting yourself or think about taking your own life. These symptoms may represent a serious problem that is an emergency. Do not wait to see if the symptoms will go away. Get medical help right away. Call your local emergency services (911 in the [place] Do not drive yourself to the hospital. Summary Coronary artery disease (CAD) is a condition in which the arteries that lead to the heart (coronary arteries) become narrow or blocked. The narrowing or blockage can lead to a heart attack. Many people do not have any symptoms during the early stages of CAD. CAD can be treated with lifestyle changes, medicines, surgery, or a combination of these treatments. This information  is not intended to replace advice given to you by your health care provider. Make sure you discuss any questions you have with your health care provider. Document Revised: 10/19/2017 Document Reviewed: 10/09/2017 Elsevier Patient Education  2022 [employer].

## 2020-12-31 NOTE — Assessment & Plan Note (Signed)
Chronic, stable with BP initially elevated today, but repeat at goal and home on average at goal.  Recent A1c November = 6.6%.  Continue Metformin and current cardiac medications -- may restart Benazepril if elevations at home and CMP stable, check today.  Recommend he continue to check BP at home at least 3 mornings a week and document.  Return in 3 months.

## 2020-12-31 NOTE — Assessment & Plan Note (Signed)
Chronic, ongoing.  Continue to monitor closely and refer to nephrology if decline -- recent AKI in hospital.  Continue Benazepril for kidney protection if levels on labs today are returned to baseline.  Renal dose medications as needed based on labs.  CMP today.

## 2020-12-31 NOTE — Assessment & Plan Note (Signed)
Ongoing, continue collaboration with cardiology and current medication regimen. 

## 2020-12-31 NOTE — Assessment & Plan Note (Signed)
Chronic, ongoing with A1c 6.6% recent check.  Decreased sensation bilateral feet, monitor closely for wounds and falls.  Continue current medication regimen and adjust as needed.

## 2020-12-31 NOTE — Progress Notes (Signed)
BP 138/68 (BP Location: Left Arm)   Pulse 64   Wt 190 lb 12.8 oz (86.5 kg)   SpO2 98%   BMI 25.17 kg/m    Subjective:    Patient ID: Sean Hacker., male    DOB: 05-Jan-1941, 80 y.o.   MRN: [id number]  HPI: Sean Yeldell. is a 80 y.o. male  Chief Complaint  Patient presents with   Anemia    Patient is here for anemia. Patient states he feels bad, bad and states it is not going good. Patient states he has an upcoming. Patient states he had to delay his appointment as he has a heart attack since he last saw.    Referral    Patient states he is requesting a referral for a sleep study.    A1c 6.6% recently in hospital on 12/21/20.  Needs referral for sleep study to attain new equipment.   HYPERTENSION / HYPERLIPIDEMIA/A-FIB Followed by Dr. Nehemiah Massed for cardiology.  Last seen 12/20/20 when he was sent to ER and diagnosed with NSTEMI, underwent PCI with DES to the proximal RCA 12/22/20.  Is to take triple therapy ASA, Brilinta, and Eliquis to discontinue ASA after 2 weeks (01/05/21).     Taking Imdur, Metoprolol, Propafenone.  They stopped Benazepril in hospital due to AKI -- he restarted this yesterday due to BP elevations at home.  Taking Rosuvastatin 5 MG.  Satisfied with current treatment? yes Duration of hypertension: chronic BP monitoring frequency: a few times a week BP range: 120-140/80's BP medication side effects: no Duration of hyperlipidemia: chronic Cholesterol medication side effects: no Cholesterol supplements: none Medication compliance: good compliance Aspirin: no Recent stressors: no Recurrent headaches: no Visual changes: no Palpitations: no Dyspnea: no Chest pain: no Lower extremity edema: no Dizzy/lightheaded: no  The ASCVD Risk score (Arnett DK, et al., 2019) failed to calculate for the following reasons:   The patient has a prior MI or stroke diagnosis   CHRONIC KIDNEY DISEASE Recent AKI in hospital with NSTEMI CKD status: stable Medications  renally dose: yes Previous renal evaluation: no Pneumovax:  Up to Date Influenza Vaccine:  Up to Date    ANEMIA H/H at discharge on 12/23/20 = 10.3/30.5.  GI recommended if ongoing to refer to hematology, he sees them for initial visit on Wednesday next week.  Currently is taking Vancomycin for c diff in hospital.   Anemia status: stable Etiology of anemia: post surgical Duration of anemia treatment:  Compliance with treatment: good compliance Iron supplementation side effects: no Severity of anemia: mild Fatigue: yes Decreased exercise tolerance: no  Dyspnea on exertion: no Palpitations: no Bleeding: no Pica: no    Relevant past medical, surgical, family and social history reviewed and updated as indicated. Interim medical history since our last visit reviewed. Allergies and medications reviewed and updated.  Review of Systems  Constitutional:  Negative for activity change, appetite change, diaphoresis, fatigue and fever.  Respiratory:  Negative for cough, chest tightness, shortness of breath and wheezing.   Cardiovascular:  Negative for chest pain, palpitations and leg swelling.  Gastrointestinal: Negative.   Endocrine: Negative for cold intolerance, heat intolerance, polydipsia, polyphagia and polyuria.  Neurological: Negative.   Psychiatric/Behavioral: Negative.     Per HPI unless specifically indicated above     Objective:    BP 138/68 (BP Location: Left Arm)   Pulse 64   Wt 190 lb 12.8 oz (86.5 kg)   SpO2 98%   BMI 25.17 kg/m   Wt Readings from  Last 3 Encounters:  12/31/20 190 lb 12.8 oz (86.5 kg)  12/20/20 191 lb 12.8 oz (87 kg)  12/10/20 191 lb 12.8 oz (87 kg)    Physical Exam Vitals and nursing note reviewed.  Constitutional:      General: He is awake. He is not in acute distress.    Appearance: He is well-developed and well-groomed. He is not ill-appearing.  HENT:     Head: Normocephalic and atraumatic.     Right Ear: Hearing normal. No drainage.      Left Ear: Hearing normal. No drainage.  Eyes:     General: Lids are normal.        Right eye: No discharge.        Left eye: No discharge.     Conjunctiva/sclera: Conjunctivae normal.     Pupils: Pupils are equal, round, and reactive to light.  Neck:     Thyroid: No thyromegaly.     Vascular: No carotid bruit.     Trachea: Trachea normal.  Cardiovascular:     Rate and Rhythm: Normal rate and regular rhythm.     Heart sounds: Normal heart sounds, S1 normal and S2 normal. No murmur heard.   No gallop.  Pulmonary:     Effort: Pulmonary effort is normal. No accessory muscle usage or respiratory distress.     Breath sounds: Normal breath sounds.  Abdominal:     General: Bowel sounds are normal.     Palpations: Abdomen is soft.  Musculoskeletal:        General: Normal range of motion.     Cervical back: Normal range of motion and neck supple.     Right lower leg: No edema.     Left lower leg: No edema.  Lymphadenopathy:     Cervical: No cervical adenopathy.  Skin:    General: Skin is warm and dry.     Capillary Refill: Capillary refill takes less than 2 seconds.  Neurological:     Mental Status: He is alert and oriented to person, place, and time.     Deep Tendon Reflexes:     Reflex Scores:      Patellar reflexes are 1+ on the right side and 1+ on the left side.      Achilles reflexes are 1+ on the right side and 1+ on the left side.    Comments: Antalgic gait, using cane.  Psychiatric:        Attention and Perception: Attention normal.        Mood and Affect: Mood normal.        Speech: Speech normal.        Behavior: Behavior normal. Behavior is cooperative.        Thought Content: Thought content normal.    Results for orders placed or performed during the hospital encounter of 12/20/20  Resp Panel by RT-PCR (Flu A&B, Covid) Nasopharyngeal Swab   Specimen: Nasopharyngeal Swab; Nasopharyngeal(NP) swabs in vial transport medium  Result Value Ref Range   SARS Coronavirus  2 by RT PCR NEGATIVE NEGATIVE   Influenza A by PCR NEGATIVE NEGATIVE   Influenza B by PCR NEGATIVE NEGATIVE  Basic metabolic panel  Result Value Ref Range   Sodium 135 135 - 145 mmol/L   Potassium 4.1 3.5 - 5.1 mmol/L   Chloride 101 98 - 111 mmol/L   CO2 27 22 - 32 mmol/L   Glucose, Bld 206 (H) 70 - 99 mg/dL   BUN 27 (H) 8 - 23 mg/dL  Creatinine, Ser 1.68 (H) 0.61 - 1.24 mg/dL   Calcium 9.7 8.9 - 10.3 mg/dL   GFR, Estimated 41 (L) >60 mL/min   Anion gap 7 5 - 15  CBC  Result Value Ref Range   WBC 6.0 4.0 - 10.5 K/uL   RBC 3.65 (L) 4.22 - 5.81 MIL/uL   Hemoglobin 11.1 (L) 13.0 - 17.0 g/dL   HCT 33.3 (L) 39.0 - 52.0 %   MCV 91.2 80.0 - 100.0 fL   MCH 30.4 26.0 - 34.0 pg   MCHC 33.3 30.0 - 36.0 g/dL   RDW 13.1 11.5 - 15.5 %   Platelets 151 150 - 400 K/uL   nRBC 0.0 0.0 - 0.2 %  Hemoglobin A1c  Result Value Ref Range   Hgb A1c MFr Bld 6.6 (H) 4.8 - 5.6 %   Mean Plasma Glucose 142.72 mg/dL  Lipid panel  Result Value Ref Range   Cholesterol 94 0 - 200 mg/dL   Triglycerides 67 <150 mg/dL   HDL 36 (L) >40 mg/dL   Total CHOL/HDL Ratio 2.6 RATIO   VLDL 13 0 - 40 mg/dL   LDL Cholesterol 45 0 - 99 mg/dL  APTT  Result Value Ref Range   aPTT 34 24 - 36 seconds  Protime-INR  Result Value Ref Range   Prothrombin Time 15.0 11.4 - 15.2 seconds   INR 1.2 0.8 - 1.2  Heparin level (unfractionated)  Result Value Ref Range   Heparin Unfractionated >1.10 (H) 0.30 - 0.70 IU/mL  APTT  Result Value Ref Range   aPTT 99 (H) 24 - 36 seconds  Glucose, capillary  Result Value Ref Range   Glucose-Capillary 138 (H) 70 - 99 mg/dL  Heparin level (unfractionated)  Result Value Ref Range   Heparin Unfractionated >1.10 (H) 0.30 - 0.70 IU/mL  Basic metabolic panel  Result Value Ref Range   Sodium 136 135 - 145 mmol/L   Potassium 3.8 3.5 - 5.1 mmol/L   Chloride 102 98 - 111 mmol/L   CO2 26 22 - 32 mmol/L   Glucose, Bld 132 (H) 70 - 99 mg/dL   BUN 19 8 - 23 mg/dL   Creatinine, Ser 1.28 (H)  0.61 - 1.24 mg/dL   Calcium 8.8 (L) 8.9 - 10.3 mg/dL   GFR, Estimated 57 (L) >60 mL/min   Anion gap 8 5 - 15  CBC  Result Value Ref Range   WBC 7.2 4.0 - 10.5 K/uL   RBC 3.39 (L) 4.22 - 5.81 MIL/uL   Hemoglobin 10.4 (L) 13.0 - 17.0 g/dL   HCT 31.0 (L) 39.0 - 52.0 %   MCV 91.4 80.0 - 100.0 fL   MCH 30.7 26.0 - 34.0 pg   MCHC 33.5 30.0 - 36.0 g/dL   RDW 13.1 11.5 - 15.5 %   Platelets 157 150 - 400 K/uL   nRBC 0.0 0.0 - 0.2 %  APTT  Result Value Ref Range   aPTT 118 (H) 24 - 36 seconds  Glucose, capillary  Result Value Ref Range   Glucose-Capillary 163 (H) 70 - 99 mg/dL  APTT  Result Value Ref Range   aPTT 122 (H) 24 - 36 seconds  Glucose, capillary  Result Value Ref Range   Glucose-Capillary 146 (H) 70 - 99 mg/dL  Glucose, capillary  Result Value Ref Range   Glucose-Capillary 172 (H) 70 - 99 mg/dL  Glucose, capillary  Result Value Ref Range   Glucose-Capillary 144 (H) 70 - 99 mg/dL  Basic metabolic panel  Result Value Ref Range   Sodium 135 135 - 145 mmol/L   Potassium 3.5 3.5 - 5.1 mmol/L   Chloride 105 98 - 111 mmol/L   CO2 25 22 - 32 mmol/L   Glucose, Bld 126 (H) 70 - 99 mg/dL   BUN 16 8 - 23 mg/dL   Creatinine, Ser 1.17 0.61 - 1.24 mg/dL   Calcium 8.4 (L) 8.9 - 10.3 mg/dL   GFR, Estimated >60 >60 mL/min   Anion gap 5 5 - 15  CBC  Result Value Ref Range   WBC 7.1 4.0 - 10.5 K/uL   RBC 3.36 (L) 4.22 - 5.81 MIL/uL   Hemoglobin 10.3 (L) 13.0 - 17.0 g/dL   HCT 30.5 (L) 39.0 - 52.0 %   MCV 90.8 80.0 - 100.0 fL   MCH 30.7 26.0 - 34.0 pg   MCHC 33.8 30.0 - 36.0 g/dL   RDW 13.1 11.5 - 15.5 %   Platelets 153 150 - 400 K/uL   nRBC 0.0 0.0 - 0.2 %  Glucose, capillary  Result Value Ref Range   Glucose-Capillary 173 (H) 70 - 99 mg/dL  Glucose, capillary  Result Value Ref Range   Glucose-Capillary 133 (H) 70 - 99 mg/dL  CBG monitoring, ED  Result Value Ref Range   Glucose-Capillary 125 (H) 70 - 99 mg/dL  CBG monitoring, ED  Result Value Ref Range    Glucose-Capillary 163 (H) 70 - 99 mg/dL   Comment 1 Notify RN   POCT Activated clotting time  Result Value Ref Range   Activated Clotting Time 376 seconds  ECHOCARDIOGRAM COMPLETE  Result Value Ref Range   Weight 3,068.8 oz   Height 73 in   BP 157/89 mmHg   Ao pk vel 0.96 m/s   AV Area VTI 3.50 cm2   AR max vel 2.82 cm2   AV Mean grad 2.0 mmHg   AV Peak grad 3.7 mmHg   S' Lateral 2.82 cm   AV Area mean vel 3.14 cm2   Area-P 1/2 3.33 cm2   MV VTI 2.30 cm2  Troponin I (High Sensitivity)  Result Value Ref Range   Troponin I (High Sensitivity) 18 (H) <18 ng/L  Troponin I (High Sensitivity)  Result Value Ref Range   Troponin I (High Sensitivity) 719 (HH) <18 ng/L  Troponin I (High Sensitivity)  Result Value Ref Range   Troponin I (High Sensitivity) 839 (HH) <18 ng/L      Assessment & Plan:   Problem List Items Addressed This Visit       Cardiovascular and Mediastinum   AF (paroxysmal atrial fibrillation) (HCC)    Chronic, ongoing.  Continue current medication regimen and collaboration with cardiology.  Monitor CBC closely due to anemia.      Coronary artery disease    Ongoing, continue collaboration with cardiology and current medication regimen.      Hypertension associated with diabetes ([place])    Chronic, stable with BP initially elevated today, but repeat at goal and home on average at goal.  Recent A1c November = 6.6%.  Continue Metformin and current cardiac medications -- may restart Benazepril if elevations at home and CMP stable, check today.  Recommend he continue to check BP at home at least 3 mornings a week and document.  Return in 3 months.        Respiratory   OSA (obstructive sleep apnea)    Referral to [facility] placed per request.      Relevant Orders  Ambulatory referral to Sleep Studies     Digestive   C. difficile colitis    Continue Vancomycin until full completed.        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  Decreased sensation bilateral feet, monitor closely for wounds and falls.  Continue current medication regimen and adjust as needed.        Genitourinary   CKD (chronic kidney disease) stage 3, GFR 30-59 ml/min (HCC)    Chronic, ongoing.  Continue to monitor closely and refer to nephrology if decline -- recent AKI in hospital.  Continue Benazepril for kidney protection if levels on labs today are returned to baseline.  Renal dose medications as needed based on labs.  CMP today.        Other   Anemia    Onoging issue with recent GI assessment performed, has upcoming visit with hematology as was recommended by GI.  Patient on Eliquis.  CBC, iron/ferritin, and B12 today.      Relevant Orders   CBC with Differential/Platelet   Comprehensive metabolic panel   Iron, TIBC and Ferritin Panel   History of non-ST elevation myocardial infarction (NSTEMI)    On 12/20/20 -- at this time continue collaboration with cardiology and current medication regimen, is to stop ASA next week.      Other Visit Diagnoses     B12 deficiency       Recheck B12 level today.   Relevant Orders   Vitamin B12        Follow up plan: Return in about 3 months (around 04/02/2021) for T2DM, HTN/HLD, ANEMIA.

## 2021-01-01 LAB — CBC WITH DIFFERENTIAL/PLATELET
Basophils Absolute: 0 10*3/uL (ref 0.0–0.2)
Basos: 0 %
EOS (ABSOLUTE): 0.1 10*3/uL (ref 0.0–0.4)
Eos: 1 %
Hematocrit: 34.3 % — ABNORMAL LOW (ref 37.5–51.0)
Hemoglobin: 11.5 g/dL — ABNORMAL LOW (ref 13.0–17.7)
Immature Grans (Abs): 0 10*3/uL (ref 0.0–0.1)
Immature Granulocytes: 0 %
Lymphocytes Absolute: 1.7 10*3/uL (ref 0.7–3.1)
Lymphs: 27 %
MCH: 30.7 pg (ref 26.6–33.0)
MCHC: 33.5 g/dL (ref 31.5–35.7)
MCV: 92 fL (ref 79–97)
Monocytes Absolute: 0.6 10*3/uL (ref 0.1–0.9)
Monocytes: 11 %
Neutrophils Absolute: 3.7 10*3/uL (ref 1.4–7.0)
Neutrophils: 61 %
Platelets: 212 10*3/uL (ref 150–450)
RBC: 3.74 x10E6/uL — ABNORMAL LOW (ref 4.14–5.80)
RDW: 13.4 % (ref 11.6–15.4)
WBC: 6.1 10*3/uL (ref 3.4–10.8)

## 2021-01-01 LAB — COMPREHENSIVE METABOLIC PANEL
ALT: 59 IU/L — ABNORMAL HIGH (ref 0–44)
AST: 47 IU/L — ABNORMAL HIGH (ref 0–40)
Albumin/Globulin Ratio: 1.8 (ref 1.2–2.2)
Albumin: 4.3 g/dL (ref 3.7–4.7)
Alkaline Phosphatase: 75 IU/L (ref 44–121)
BUN/Creatinine Ratio: 18 (ref 10–24)
BUN: 23 mg/dL (ref 8–27)
Bilirubin Total: 0.4 mg/dL (ref 0.0–1.2)
CO2: 24 mmol/L (ref 20–29)
Calcium: 9.4 mg/dL (ref 8.6–10.2)
Chloride: 100 mmol/L (ref 96–106)
Creatinine, Ser: 1.28 mg/dL — ABNORMAL HIGH (ref 0.76–1.27)
Globulin, Total: 2.4 g/dL (ref 1.5–4.5)
Glucose: 160 mg/dL — ABNORMAL HIGH (ref 70–99)
Potassium: 4.2 mmol/L (ref 3.5–5.2)
Sodium: 137 mmol/L (ref 134–144)
Total Protein: 6.7 g/dL (ref 6.0–8.5)
eGFR: 57 mL/min/{1.73_m2} — ABNORMAL LOW (ref >59)

## 2021-01-01 LAB — IRON,TIBC AND FERRITIN PANEL
Ferritin: 99 ng/mL (ref 30–400)
Iron Saturation: 18 % (ref 15–55)
Iron: 62 ug/dL (ref 38–169)
Total Iron Binding Capacity: 344 ug/dL (ref 250–450)
UIBC: 282 ug/dL (ref 111–343)

## 2021-01-01 LAB — VITAMIN B12: Vitamin B-12: 644 pg/mL (ref 232–1245)

## 2021-01-02 NOTE — Progress Notes (Signed)
Contacted via [place] [facility], your labs have returned: - CBC continues to show mild anemia, but some improvement from previous check. - Kidney function, creatinine and eGFR, shows stable kidney disease with no worsening. - Liver function, AST and ALT, mildly elevated would recommend decreasing any alcohol or Tylenol use at home and we will recheck next visit. - B12 and iron levels are stable. Any questions? Keep being amazing!!  Thank you for allowing me to participate in your care.  I appreciate you. Kindest regards, Voncille Simm

## 2021-01-03 DIAGNOSIS — E785 Hyperlipidemia, unspecified: Secondary | ICD-10-CM | POA: Diagnosis not present

## 2021-01-03 DIAGNOSIS — R0602 Shortness of breath: Secondary | ICD-10-CM | POA: Diagnosis not present

## 2021-01-03 DIAGNOSIS — I25118 Atherosclerotic heart disease of native coronary artery with other forms of angina pectoris: Secondary | ICD-10-CM | POA: Diagnosis not present

## 2021-01-03 DIAGNOSIS — E1169 Type 2 diabetes mellitus with other specified complication: Secondary | ICD-10-CM | POA: Diagnosis not present

## 2021-01-03 DIAGNOSIS — I10 Essential (primary) hypertension: Secondary | ICD-10-CM | POA: Diagnosis not present

## 2021-01-03 DIAGNOSIS — I480 Paroxysmal atrial fibrillation: Secondary | ICD-10-CM | POA: Diagnosis not present

## 2021-01-05 ENCOUNTER — Inpatient Hospital Stay: Payer: Medicare Other

## 2021-01-05 ENCOUNTER — Other Ambulatory Visit: Payer: Self-pay

## 2021-01-05 ENCOUNTER — Encounter: Payer: Self-pay | Admitting: Internal Medicine

## 2021-01-05 ENCOUNTER — Inpatient Hospital Stay: Payer: Medicare Other | Attending: Internal Medicine | Admitting: Internal Medicine

## 2021-01-05 DIAGNOSIS — E1122 Type 2 diabetes mellitus with diabetic chronic kidney disease: Secondary | ICD-10-CM | POA: Insufficient documentation

## 2021-01-05 DIAGNOSIS — I480 Paroxysmal atrial fibrillation: Secondary | ICD-10-CM | POA: Insufficient documentation

## 2021-01-05 DIAGNOSIS — D649 Anemia, unspecified: Secondary | ICD-10-CM | POA: Diagnosis not present

## 2021-01-05 DIAGNOSIS — N183 Chronic kidney disease, stage 3 unspecified: Secondary | ICD-10-CM | POA: Diagnosis not present

## 2021-01-05 DIAGNOSIS — Z7982 Long term (current) use of aspirin: Secondary | ICD-10-CM | POA: Diagnosis not present

## 2021-01-05 DIAGNOSIS — Z7901 Long term (current) use of anticoagulants: Secondary | ICD-10-CM | POA: Insufficient documentation

## 2021-01-05 DIAGNOSIS — Z7984 Long term (current) use of oral hypoglycemic drugs: Secondary | ICD-10-CM | POA: Insufficient documentation

## 2021-01-05 DIAGNOSIS — E611 Iron deficiency: Secondary | ICD-10-CM | POA: Insufficient documentation

## 2021-01-05 DIAGNOSIS — Z808 Family history of malignant neoplasm of other organs or systems: Secondary | ICD-10-CM | POA: Diagnosis not present

## 2021-01-05 DIAGNOSIS — D631 Anemia in chronic kidney disease: Secondary | ICD-10-CM | POA: Diagnosis not present

## 2021-01-05 NOTE — Progress Notes (Signed)
[facility] NOTE  Patient Care Team: [name], NP as PCP - General (Nurse Practitioner) [name], MD as PCP - Family Medicine (Family Medicine) Corey Skains, MD as Consulting Physician (Internal Medicine) Ralene Bathe, MD (Dermatology) Kary Kos, MD as Consulting Physician (Neurosurgery) Marry Guan, Laurice Record, MD (Orthopedic Surgery) Marlaine Hind, MD as Consulting Physician (Physical Medicine and Rehabilitation) Christy Sartorius, MD as Referring Physician (Urology) Vanita Ingles, RN as Case Manager (General Practice)  CHIEF COMPLAINTS/PURPOSE OF CONSULTATION: ANEMIA   HEMATOLOGY HISTORY: ;  #Chronic anemia hemoglobin 10-11; October 22 -iron saturation 17% ferritin 90s.   # OCT 2022-non-STEMI- [s/p stenting]aspirin Brilinta; paroxysmal A. fib on Eliquis [Dr. Paraschoes];CKD stage III; chronic back pain-s/p back surgeries s/p spinal cord-pain stimulator; diabetes on oral medication  HISTORY OF PRESENTING ILLNESS:  Sean Reyes. 80 y.o.  male has been referred to [place] for further evaluation/work-up for anemia.  Patient was recently admitted to hospital for non-STEMI-s/p cardiac cath.  Patient is currently on aspirin Brilinta and Eliquis.  Of note patient's course was complicated by C. difficile colitis-s/p oral vancomycin.   Patient complains of chronic anemia.  Patient had recent endoscopies negative for any acute bleeding.   Blood in stools: hemmoroidal ; EGD/Colonoscopy: Sep, 2022- Dr.Anna Change in bowel habits- None Blood in urine: None Difficulty swallowing: None Abnormal weight loss: None Iron supplementation: yes- on slow release [constipation better], since 2 months Prior Blood transfusions: remote s/p back surgery.  Bariatric surgery: None   Review of Systems  Constitutional:  Positive for malaise/fatigue. Negative for chills, diaphoresis, fever and weight loss.  HENT:  Negative for nosebleeds and sore throat.   Eyes:   Negative for double vision.  Respiratory:  Positive for shortness of breath. Negative for cough, hemoptysis, sputum production and wheezing.   Cardiovascular:  Negative for chest pain, palpitations, orthopnea and leg swelling.  Gastrointestinal:  Negative for abdominal pain, blood in stool, constipation, diarrhea, heartburn, melena, nausea and vomiting.  Genitourinary:  Negative for dysuria, frequency and urgency.  Musculoskeletal:  Positive for back pain and joint pain.  Skin: Negative.  Negative for itching and rash.  Neurological:  Negative for dizziness, tingling, focal weakness, weakness and headaches.  Endo/Heme/Allergies:  Does not bruise/bleed easily.  Psychiatric/Behavioral:  Negative for depression. The patient is not nervous/anxious and does not have insomnia.    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   Hyperlipidemia    Hypertension    Nocturia    S/P ablation of atrial fibrillation    Ablative therapy   Sleep apnea    CPAP    Spinal stenosis    Tachycardia, unspecified     SURGICAL HISTORY: Past Surgical History:  Procedure Laterality Date   ABLATION     ANTERIOR LAT LUMBAR FUSION N/A 06/27/2017   Procedure: Anterior Lateral Lumbar  Interbody  Fusion - Lumbar Two-Lumbar Three - Lumbar Three-Lumbar Four, Posterior Lumbar Interbody Fusion Lumbar Four- Five;  Surgeon: Kary Kos, MD;  Location: [employer];  Service: Neurosurgery;  Laterality: N/A;  Anterior Lateral Lumbar Interbody  Fusion - Lumbar  Two-Lumbar Three - Lumbar Three-Lumbar Four, Posterior Lumbar Interbody Fusion Lumbar Four- Five   BACK SURGERY     CARDIOVERSION N/A 08/29/2018   Procedure: CARDIOVERSION;  Surgeon: Corey Skains, MD;  Location: [facility] ORS;  Service: Cardiovascular;  Laterality: N/A;   CARDIOVERSION N/A 09/24/2018   Procedure: CARDIOVERSION;  Surgeon: Corey Skains, MD;  Location: [facility] ORS;  Service: Cardiovascular;  Laterality: N/A;   COLONOSCOPY WITH PROPOFOL N/A 10/05/2015   Procedure: COLONOSCOPY WITH PROPOFOL;  Surgeon: Lollie Sails, MD;  Location: [facility] ENDOSCOPY;  Service: Endoscopy;  Laterality: N/A;   COLONOSCOPY WITH PROPOFOL N/A 11/01/2020   Procedure: COLONOSCOPY WITH PROPOFOL;  Surgeon: Jonathon Bellows, MD;  Location: [facility] ENDOSCOPY;  Service: Gastroenterology;  Laterality: N/A;   CORONARY STENT INTERVENTION N/A 12/22/2020   Procedure: CORONARY STENT INTERVENTION;  Surgeon: [name], MD;  Location: [facility];  Service: Cardiovascular;  Laterality: N/A;   ESOPHAGOGASTRODUODENOSCOPY N/A 11/01/2020   Procedure: ESOPHAGOGASTRODUODENOSCOPY (EGD);  Surgeon: Jonathon Bellows, MD;  Location: [facility] ENDOSCOPY;  Service: Gastroenterology;  Laterality: N/A;   ESOPHAGOGASTRODUODENOSCOPY (EGD) WITH PROPOFOL N/A 04/01/2018   Procedure: ESOPHAGOGASTRODUODENOSCOPY (EGD) WITH PROPOFOL;  Surgeon: Lollie Sails, MD;  Location: [facility] ENDOSCOPY;  Service: Endoscopy;  Laterality: N/A;   HERNIA REPAIR     JOINT REPLACEMENT Bilateral    hips  RT+  LEFT X2    LEFT HEART CATH AND CORONARY ANGIOGRAPHY N/A 12/22/2020   Procedure: LEFT HEART CATH AND CORONARY ANGIOGRAPHY;  Surgeon: [name], MD;  Location: [place] [facility];  Service: Cardiovascular;  Laterality: N/A;   LUMBAR LAMINECTOMY/DECOMPRESSION MICRODISCECTOMY Left 09/13/2016   Procedure: Microdiscectomy - Lumbar two-three,  Lumbar three- - left;  Surgeon: Kary Kos, MD;  Location: [place];  Service: Neurosurgery;  Laterality: Left;    SPINAL CORD STIMULATOR INSERTION  07/08/2019   TONSILLECTOMY      SOCIAL HISTORY: Social History   Socioeconomic History   Marital status: Married    Spouse name: Malachy Mood    Number of children: 2   Years of education: Not on file   Highest education level: High school graduate  Occupational History   Occupation: retired   Tobacco Use   Smoking status: Never   Smokeless tobacco: Never  Vaping Use   Vaping Use: Never used  Substance and Sexual Activity   Alcohol use: No   Drug use: No   Sexual activity: Not on file  Other Topics Concern   Not on file  Social History Narrative   MarriedGets regular exercise.      Lives in [place] with wife/ daughter. Never smoked; no alcohol. 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    Social Determinants of Health   Financial Resource Strain: Low Risk    Difficulty of Paying Living Expenses: Not hard at all  Food Insecurity: No Food Insecurity   Worried About Charity fundraiser in the Last Year: Never true   [place] in the Last Year: Never true  Transportation Needs: No Transportation Needs   Lack of Transportation (Medical): No   Lack of Transportation (Non-Medical): No  Physical Activity: Inactive   Days of Exercise per Week: 0 days   Minutes of Exercise per Session: 0 min  Stress: No Stress Concern Present   Feeling of Stress : Not at all  Social Connections: Moderately Integrated   Frequency of Communication with Friends and Family: More than three times a week   Frequency of Social Gatherings with Friends and Family: More than three times a week   Attends Religious Services: More than 4 times per year   Active Member of [employer] or Organizations: No   Attends Music therapist: Never   Marital Status: Married  Human resources officer Violence: Not At Risk   Fear of Current or Ex-Partner: No   Emotionally Abused: No   Physically Abused: No   Sexually Abused: No    FAMILY  HISTORY: Family History  Problem Relation Age of Onset   Brain cancer Mother    Kidney disease Neg Hx    Prostate cancer Neg Hx    Kidney cancer Neg Hx    Bladder Cancer Neg Hx     ALLERGIES:  is allergic to levaquin [levofloxacin in d5w], shellfish allergy, amiodarone, and adhesive [tape].  MEDICATIONS:  Current Outpatient Medications  Medication Sig Dispense Refill   acetaminophen (TYLENOL) 500 MG tablet Take 500 mg by mouth every 6 (six) hours as needed (pain).     aspirin 81 MG chewable tablet Chew 1 tablet (81 mg total) by mouth daily for 14 days. 14 tablet 0   benazepril (LOTENSIN) 10 MG tablet Take 10 mg by mouth daily. Patient states that he takes this PRN     ELIQUIS 5 MG TABS tablet Take 1 tablet (5 mg total) by mouth 2 (two) times daily. 180 tablet 4   ferrous sulfate 325 (65 FE) MG tablet Take by mouth.     finasteride (PROSCAR) 5 MG tablet Take 1 tablet (5 mg total) by mouth daily. 90 tablet 2   gabapentin (NEURONTIN) 300 MG capsule Take 1 capsule (300 mg total) by mouth 2 (two) times daily. 180 capsule 4   isosorbide mononitrate (IMDUR) 30 MG 24 hr tablet Take 30 mg by mouth daily.     metFORMIN (GLUCOPHAGE) 500 MG tablet Take by mouth 2 (two) times daily with a meal.     metoprolol succinate (TOPROL-XL) 25 MG 24 hr tablet Take 1 tablet by mouth daily.     omeprazole (PRILOSEC) 10 MG capsule Take 10 mg by mouth daily.     propafenone (RYTHMOL SR) 425 MG 12 hr capsule Take 425 mg by mouth 2 (two) times daily.     rosuvastatin (CRESTOR) 5 MG tablet Take 1 tablet (5 mg total) by mouth daily. 90 tablet 2   tamsulosin (FLOMAX) 0.4 MG CAPS capsule Take 0.8 mg by mouth daily.     ticagrelor (BRILINTA) 90 MG TABS tablet Take 1 tablet (90 mg total) by mouth 2 (two) times daily. 60 tablet 0   vancomycin (VANCOCIN) 125 MG capsule Take 1 capsule (125 mg total) by mouth 4 (four) times daily for 14 days, THEN 1 capsule (125 mg total) in the morning, at noon, and at bedtime for 21 days,  THEN 1 capsule (125 mg total) in the morning and at bedtime for 14 days, THEN 1 capsule (125 mg total) daily for 7 days. 154 capsule 0   pantoprazole (PROTONIX) 40 MG tablet Take by mouth. (Patient not taking: Reported on 01/05/2021)     trimethoprim-polymyxin b (POLYTRIM) ophthalmic solution Apply to eye. (Patient not taking: Reported on 01/05/2021)     No current facility-administered medications for this visit.      PHYSICAL  EXAMINATION:   Vitals:   01/05/21 1040 01/05/21 1041  BP:  124/64  Pulse:  (!) 59  Resp: 18 18  Temp:  98.7 F (37.1 C)  SpO2:  100%   Filed Weights   01/05/21 1040  Weight: 189 lb (85.7 kg)    Physical Exam Vitals and nursing note reviewed.  HENT:     Head: Normocephalic and atraumatic.     Mouth/Throat:     Pharynx: Oropharynx is clear.  Eyes:     Extraocular Movements: Extraocular movements intact.     Pupils: Pupils are equal, round, and reactive to light.  Cardiovascular:     Rate and Rhythm: Normal rate and regular rhythm.  Pulmonary:     Comments: Decreased breath sounds bilaterally.  Abdominal:     Palpations: Abdomen is soft.  Musculoskeletal:        General: Normal range of motion.     Cervical back: Normal range of motion.  Skin:    General: Skin is warm.  Neurological:     General: No focal deficit present.     Mental Status: He is alert and oriented to person, place, and time.  Psychiatric:        Behavior: Behavior normal.        Judgment: Judgment normal.    LABORATORY DATA:  I have reviewed the data as listed Lab Results  Component Value Date   WBC 6.1 12/31/2020   HGB 11.5 (L) 12/31/2020   HCT 34.3 (L) 12/31/2020   MCV 92 12/31/2020   PLT 212 12/31/2020   Recent Labs    02/26/20 1102 06/10/20 0945 09/09/20 1133 12/20/20 2011 12/22/20 0410 12/23/20 0417 12/31/20 1633  NA 138 137   < > 135 136 135 137  K 4.5 4.5   < > 4.1 3.8 3.5 4.2  CL 99 98   < > 101 102 105 100  CO2 25 24   < > _0 GLUCOSE  184* 198*   < > 206* 132* 126* 160*  BUN 17 20   < > 27* _1 CREATININE 1.25 1.29*   < > 1.68* 1.28* 1.17 1.28*  CALCIUM 9.6 9.6   < > 9.7 8.8* 8.4* 9.4  GFRNONAA 55*  --   --  41* 57* >60  --   GFRAA 64  --   --   --   --   --   --   PROT  --  6.7  --   --   --   --  6.7  ALBUMIN  --  4.1  --   --   --   --  4.3  AST  --  16  --   --   --   --  47*  ALT  --  19  --   --   --   --  59*  ALKPHOS  --  69  --   --   --   --  75  BILITOT  --  0.4  --   --   --   --  0.4   < > = values in this interval not displayed.     DG Chest 2 View  Result Date: 12/20/2020 CLINICAL DATA:  Chest pain EXAM: CHEST - 2 VIEW COMPARISON:  04/29/2018 FINDINGS: Heart size is normal. No pleural effusion or edema identified. No airspace opacities identified. Dorsal column stimulator is identified within the mid and lower thoracic spinal canal.  Thoracic degenerative disc disease identified. IMPRESSION: No acute cardiopulmonary abnormalities. Electronically Signed   By: Kerby Moors M.D.   On: 12/20/2020 20:52   CARDIAC CATHETERIZATION  Result Date: 12/22/2020   Prox RCA lesion is 99% stenosed.   Dist LAD lesion is 70% stenosed.   Mid LAD lesion is 50% stenosed.   Mid Cx to Dist Cx lesion is 80% stenosed.   A drug-eluting stent was successfully placed using a STENT ONYX FRONTIER [id number].   Post intervention, there is a 0% residual stenosis.   There is mild left ventricular systolic dysfunction.   The left ventricular ejection fraction is 45-50% by visual estimate. 1.  Non-ST elevation myocardial infarction 2.  Mildly reduced left ventricular function with LVEF 45 to 50% 3.  Three-vessel coronary artery disease with 70% stenosis very distal segment LAD, diffuse 80% stenosis small caliber AV groove left circumflex, and high-grade 99% stenosis proximal RCA 4.  Successful PCI with DES proximal RCA Recommendations 1.  Dual antiplatelet therapy 2.  Aggressive risk factor modification 3.  Continue metoprolol succinate 4.   Restart Eliquis on 12/23/2020, continue triple therapy for 2 weeks at which time we will discontinue aspirin as outpatient and continue Eliquis and Brilinta   ECHOCARDIOGRAM COMPLETE  Result Date: 12/21/2020    ECHOCARDIOGRAM REPORT   Patient Name:   Sean Reyes. Date of Exam: 12/21/2020 Medical Rec #:  [id number]        Height:       73.0 in Accession #:    [id number]       Weight:       191.8 lb Date of Birth:  10/22/1940        BSA:          2.114 m Patient Age:    72 years         BP:           157/89 mmHg Patient Gender: M                HR:           56 bpm. Exam Location:  [facility] Procedure: 2D Echo, Color Doppler and Cardiac Doppler Indications:     Acute myocardial infarction -unspecified I21.9  History:         Patient has no prior history of Echocardiogram examinations.                  Arrythmias:Atrial Fibrillation; Risk Factors:Hypertension,                  Diabetes and Dyslipidemia.  Sonographer:     Sherrie Sport Referring Phys:  [place] Diagnosing Phys: [name] MD IMPRESSIONS  1. Left ventricular ejection fraction, by estimation, is 65 to 70%. The left ventricle has normal function. The left ventricle has no regional wall motion abnormalities. There is mild left ventricular hypertrophy. Left ventricular diastolic parameters were normal.  2. Right ventricular systolic function is normal. The right ventricular size is normal.  3. The mitral valve is normal in structure. Trivial mitral valve regurgitation. No evidence of mitral stenosis.  4. The aortic valve is normal in structure. Aortic valve regurgitation is not visualized. No aortic stenosis is present.  5. The inferior vena cava is normal in size with greater than 50% respiratory variability, suggesting right atrial pressure of 3 mmHg. FINDINGS  Left Ventricle: Left ventricular ejection fraction, by estimation, is 65 to 70%. The left ventricle has normal function. The left ventricle has  no regional wall motion  abnormalities. The left ventricular internal cavity size was normal in size. There is  mild left ventricular hypertrophy. Left ventricular diastolic parameters were normal. Right Ventricle: The right ventricular size is normal. No increase in right ventricular wall thickness. Right ventricular systolic function is normal. Left Atrium: Left atrial size was normal in size. Right Atrium: Right atrial size was normal in size. Pericardium: There is no evidence of pericardial effusion. Mitral Valve: The mitral valve is normal in structure. Trivial mitral valve regurgitation. No evidence of mitral valve stenosis. MV peak gradient, 3.3 mmHg. The mean mitral valve gradient is 1.0 mmHg. Tricuspid Valve: The tricuspid valve is normal in structure. Tricuspid valve regurgitation is trivial. No evidence of tricuspid stenosis. Aortic Valve: The aortic valve is normal in structure. Aortic valve regurgitation is not visualized. No aortic stenosis is present. Aortic valve mean gradient measures 2.0 mmHg. Aortic valve peak gradient measures 3.7 mmHg. Aortic valve area, by VTI measures 3.50 cm. Pulmonic Valve: The pulmonic valve was normal in structure. Pulmonic valve regurgitation is not visualized. No evidence of pulmonic stenosis. Aorta: The aortic root is normal in size and structure. Venous: The inferior vena cava is normal in size with greater than 50% respiratory variability, suggesting right atrial pressure of 3 mmHg. IAS/Shunts: No atrial level shunt detected by color flow Doppler.  LEFT VENTRICLE PLAX 2D LVIDd:         4.43 cm   Diastology LVIDs:         2.82 cm   LV e' medial:    7.18 cm/s LV PW:         1.43 cm   LV E/e' medial:  10.7 LV IVS:        1.29 cm   LV e' lateral:   11.40 cm/s LVOT diam:     2.00 cm   LV E/e' lateral: 6.8 LV SV:         58 LV SV Index:   27 LVOT Area:     3.14 cm  RIGHT VENTRICLE RV Basal diam:  3.17 cm RV S prime:     15.80 cm/s TAPSE (M-mode): 4.8 cm LEFT ATRIUM             Index        RIGHT  ATRIUM           Index LA diam:        4.10 cm 1.94 cm/m   RA Area:     21.40 cm LA Vol (A2C):   82.0 ml 38.79 ml/m  RA Volume:   63.50 ml  30.04 ml/m LA Vol (A4C):   54.7 ml 25.88 ml/m LA Biplane Vol: 73.8 ml 34.92 ml/m  AORTIC VALVE                    PULMONIC VALVE AV Area (Vmax):    2.82 cm     PV Vmax:        0.79 m/s AV Area (Vmean):   3.14 cm     PV Vmean:       53.500 cm/s AV Area (VTI):     3.50 cm     PV VTI:         0.148 m AV Vmax:           96.10 cm/s   PV Peak grad:   2.5 mmHg AV Vmean:          62.400 cm/s  PV Mean grad:  1.0 mmHg AV VTI:            0.166 m      RVOT Peak grad: 3 mmHg AV Peak Grad:      3.7 mmHg AV Mean Grad:      2.0 mmHg LVOT Vmax:         86.20 cm/s LVOT Vmean:        62.400 cm/s LVOT VTI:          0.185 m LVOT/AV VTI ratio: 1.11  AORTA Ao Root diam: 3.87 cm MITRAL VALVE               TRICUSPID VALVE MV Area (PHT): 3.33 cm    TR Peak grad:   12.7 mmHg MV Area VTI:   2.30 cm    TR Vmax:        178.00 cm/s MV Peak grad:  3.3 mmHg MV Mean grad:  1.0 mmHg    SHUNTS MV Vmax:       0.90 m/s    Systemic VTI:  0.18 m MV Vmean:      54.6 cm/s   Systemic Diam: 2.00 cm MV Decel Time: 228 msec    Pulmonic VTI:  0.145 m MV E velocity: 77.10 cm/s MV A velocity: 56.10 cm/s MV E/A ratio:  1.37 [name] MD Electronically signed by [name] MD Signature Date/Time: 12/21/2020/2:59:34 PM    Final     Symptomatic anemia Mild to moderate symptomatic anemia Hb 11; ferritin-90; iron sat- 18%.Santina Evans CKD-III.  #Given the mild low iron saturation of recommend Venofer weekly x4. Discussed the potential acute infusion reactions with IV iron; which are quite rare.  Patient understands the risk; will proceed with infusions.   However I had a Long discussion with the patient regarding multiple etiologies of anemia including iron deficiency/nutritional deficiency /chronic kidney disease chronic inflammation.  Also discussed possibility of primary bone marrow disorders which  would need a bone marrow biopsy to confirm.  However, hold off any bone marrow evaluation at this time-awaiting work-up below.    Check CBC CMP LDH CRP haptoglobin; iron studies/ferritin; myeloma panel kappa lambda light chain; review of peripheral smear at next visit/2 months.   Thank you, Ms. [name] for allowing me to participate in the care of your pleasant patient. Please do not hesitate to contact me with questions or concerns in the interim.  # DISPOSITION:  # NO labs today # venofer weekly x4- start next week # in 2 months- MD; labs- cbc/cmp; MM panel; K/l light chain ratio; possible venofer- Dr.B     All questions were answered. The patient knows to call the clinic with any problems, questions or concerns.      Cammie Sickle, MD 01/05/2021 1:08 PM

## 2021-01-05 NOTE — Progress Notes (Signed)
Patient states that he was in the hospital for a heart attack and that he feels "bad" all the time. 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

## 2021-01-05 NOTE — Assessment & Plan Note (Addendum)
Mild to moderate symptomatic anemia Hb 11; ferritin-90; iron sat- 18%.Sean Reyes CKD-III.  #Given the mild low iron saturation of recommend Venofer weekly x4. Discussed the potential acute infusion reactions with IV iron; which are quite rare.  Patient understands the risk; will proceed with infusions.   However I had a Long discussion with the patient regarding multiple etiologies of anemia including iron deficiency/nutritional deficiency /chronic kidney disease chronic inflammation.  Also discussed possibility of primary bone marrow disorders which would need a bone marrow biopsy to confirm.  However, hold off any bone marrow evaluation at this time-awaiting work-up below.    Check CBC CMP LDH CRP haptoglobin; iron studies/ferritin; myeloma panel kappa lambda light chain; review of peripheral smear at next visit/2 months.   Thank you, Ms. [name] for allowing me to participate in the care of your pleasant patient. Please do not hesitate to contact me with questions or concerns in the interim.  # DISPOSITION:  # NO labs today # venofer weekly x4- start next week # in 2 months- MD; labs- cbc/cmp; MM panel; K/l light chain ratio; possible venofer- Dr.B

## 2021-01-10 ENCOUNTER — Ambulatory Visit: Payer: Medicare Other | Admitting: Nurse Practitioner

## 2021-01-11 DIAGNOSIS — I219 Acute myocardial infarction, unspecified: Secondary | ICD-10-CM | POA: Diagnosis not present

## 2021-01-11 DIAGNOSIS — I25118 Atherosclerotic heart disease of native coronary artery with other forms of angina pectoris: Secondary | ICD-10-CM | POA: Diagnosis not present

## 2021-01-11 DIAGNOSIS — I480 Paroxysmal atrial fibrillation: Secondary | ICD-10-CM | POA: Diagnosis not present

## 2021-01-11 DIAGNOSIS — R0602 Shortness of breath: Secondary | ICD-10-CM | POA: Diagnosis not present

## 2021-01-11 DIAGNOSIS — G4730 Sleep apnea, unspecified: Secondary | ICD-10-CM | POA: Diagnosis not present

## 2021-01-12 ENCOUNTER — Other Ambulatory Visit: Payer: Self-pay

## 2021-01-12 ENCOUNTER — Inpatient Hospital Stay: Payer: Medicare Other

## 2021-01-12 VITALS — BP 152/82 | HR 89 | Temp 97.0°F | Resp 18

## 2021-01-12 DIAGNOSIS — D649 Anemia, unspecified: Secondary | ICD-10-CM

## 2021-01-12 DIAGNOSIS — N183 Chronic kidney disease, stage 3 unspecified: Secondary | ICD-10-CM | POA: Diagnosis not present

## 2021-01-12 MED ORDER — SODIUM CHLORIDE 0.9 % IV SOLN: 200.0000 mg | Freq: Once | INTRAVENOUS | Status: DC

## 2021-01-12 MED ORDER — SODIUM CHLORIDE 0.9 % IV SOLN
Freq: Once | INTRAVENOUS | Status: AC
  Filled 2021-01-12: qty 250

## 2021-01-12 MED ORDER — IRON SUCROSE 20 MG/ML IV SOLN
200.0000 mg | Freq: Once | INTRAVENOUS | Status: AC
  Administered 2021-01-12: 200 mg via INTRAVENOUS
  Filled 2021-01-12: qty 10

## 2021-01-17 ENCOUNTER — Telehealth: Payer: Self-pay | Admitting: Nurse Practitioner

## 2021-01-17 NOTE — Telephone Encounter (Signed)
Copied from [place] [id number]. Topic: General - Call Back - No Documentation >> Jan 17, 2021  2:30 PM Erick Blinks wrote: Reason for CRM: Pt wants to discuss the findings from his sleep study, please advise Best contact: [phone]

## 2021-01-17 NOTE — Telephone Encounter (Signed)
Patient was made aware of Jolene's recommendations and states he is not really interested in having the study repeated. Patient states his daughter advised him to reach out to our office and follow up. Patient states he will reach out to their office. Patient verbalized understanding and has no further questions at this time.

## 2021-01-19 ENCOUNTER — Other Ambulatory Visit: Payer: Self-pay

## 2021-01-19 ENCOUNTER — Encounter: Payer: Medicare Other | Attending: Cardiology

## 2021-01-19 ENCOUNTER — Inpatient Hospital Stay: Payer: Medicare Other | Attending: Internal Medicine

## 2021-01-19 VITALS — BP 151/82 | HR 60 | Temp 97.0°F | Resp 18

## 2021-01-19 VITALS — Ht 71.5 in | Wt 189.7 lb

## 2021-01-19 DIAGNOSIS — E611 Iron deficiency: Secondary | ICD-10-CM | POA: Diagnosis not present

## 2021-01-19 DIAGNOSIS — N183 Chronic kidney disease, stage 3 unspecified: Secondary | ICD-10-CM | POA: Diagnosis not present

## 2021-01-19 DIAGNOSIS — Z955 Presence of coronary angioplasty implant and graft: Secondary | ICD-10-CM | POA: Insufficient documentation

## 2021-01-19 DIAGNOSIS — I25118 Atherosclerotic heart disease of native coronary artery with other forms of angina pectoris: Secondary | ICD-10-CM | POA: Diagnosis not present

## 2021-01-19 DIAGNOSIS — D649 Anemia, unspecified: Secondary | ICD-10-CM

## 2021-01-19 DIAGNOSIS — D631 Anemia in chronic kidney disease: Secondary | ICD-10-CM | POA: Insufficient documentation

## 2021-01-19 DIAGNOSIS — I214 Non-ST elevation (NSTEMI) myocardial infarction: Secondary | ICD-10-CM | POA: Diagnosis not present

## 2021-01-19 MED ORDER — SODIUM CHLORIDE 0.9 % IV SOLN
Freq: Once | INTRAVENOUS | Status: AC
  Filled 2021-01-19: qty 250

## 2021-01-19 MED ORDER — SODIUM CHLORIDE 0.9 % IV SOLN: 200.0000 mg | Freq: Once | INTRAVENOUS | Status: DC

## 2021-01-19 MED ORDER — IRON SUCROSE 20 MG/ML IV SOLN
200.0000 mg | Freq: Once | INTRAVENOUS | Status: AC
  Administered 2021-01-19: 200 mg via INTRAVENOUS
  Filled 2021-01-19: qty 10

## 2021-01-19 NOTE — Progress Notes (Signed)
Cardiac Individual Treatment Plan  Patient Details  Name: Sean Reyes. MRN: [id number] Date of Birth: [birth year] Referring Provider:   Flowsheet Row Cardiac Rehab from 01/19/2021 in [facility] Cardiac and Pulmonary Rehab  Referring Provider [name] MD       Initial Encounter Date:  Flowsheet Row Cardiac Rehab from 01/19/2021 in [facility] Cardiac and Pulmonary Rehab  Date 01/19/21       Visit Diagnosis: NSTEMI (non-ST elevated myocardial infarction) [facility])  Status post coronary artery stent placement  Patient's Home Medications on Admission:  Current Outpatient Medications:    acetaminophen (TYLENOL) 500 MG tablet, Take 500 mg by mouth every 6 (six) hours as needed (pain)., Disp: , Rfl:    benazepril (LOTENSIN) 10 MG tablet, Take 10 mg by mouth daily. Patient states that he takes this PRN, Disp: , Rfl:    ELIQUIS 5 MG TABS tablet, Take 1 tablet (5 mg total) by mouth 2 (two) times daily., Disp: 180 tablet, Rfl: 4   ferrous sulfate 325 (65 FE) MG tablet, Take by mouth., Disp: , Rfl:    finasteride (PROSCAR) 5 MG tablet, Take 1 tablet (5 mg total) by mouth daily., Disp: 90 tablet, Rfl: 2   gabapentin (NEURONTIN) 300 MG capsule, Take 1 capsule (300 mg total) by mouth 2 (two) times daily., Disp: 180 capsule, Rfl: 4   isosorbide mononitrate (IMDUR) 30 MG 24 hr tablet, Take 30 mg by mouth daily., Disp: , Rfl:    metFORMIN (GLUCOPHAGE) 500 MG tablet, Take by mouth 2 (two) times daily with a meal., Disp: , Rfl:    metoprolol succinate (TOPROL-XL) 25 MG 24 hr tablet, Take 1 tablet by mouth daily., Disp: , Rfl:    omeprazole (PRILOSEC) 10 MG capsule, Take 10 mg by mouth daily., Disp: , Rfl:    pantoprazole (PROTONIX) 40 MG tablet, Take by mouth. (Patient not taking: Reported on 01/05/2021), Disp: , Rfl:    propafenone (RYTHMOL SR) 425 MG 12 hr capsule, Take 425 mg by mouth 2 (two) times daily., Disp: , Rfl:    rosuvastatin (CRESTOR) 5 MG tablet, Take 1 tablet (5 mg total) by mouth daily.,  Disp: 90 tablet, Rfl: 2   tamsulosin (FLOMAX) 0.4 MG CAPS capsule, Take 0.8 mg by mouth daily., Disp: , Rfl:    ticagrelor (BRILINTA) 90 MG TABS tablet, Take 1 tablet (90 mg total) by mouth 2 (two) times daily., Disp: 60 tablet, Rfl: 0   trimethoprim-polymyxin b (POLYTRIM) ophthalmic solution, Apply to eye. 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   Hyperlipidemia    Hypertension    Nocturia    S/P ablation of atrial fibrillation    Ablative therapy   Sleep apnea    CPAP    Spinal stenosis    Tachycardia, unspecified     Tobacco  Use: Social History   Tobacco Use  Smoking Status Never  Smokeless Tobacco Never    Labs: Recent Review Flowsheet Data     Labs for ITP Cardiac and Pulmonary Rehab Latest Ref Rng & Units 02/26/2020 06/10/2020 09/09/2020 12/10/2020 12/21/2020   Cholestrol 0 - 200 mg/dL 120 - 105 - 94   LDLCALC 0 - 99 mg/dL 58 - 43 - 45   HDL >40 mg/dL 43 - 41 - 36(L)   Trlycerides <150 mg/dL 103 - 113 - 67   Hemoglobin A1c 4.8 - 5.6 % 6.6 6.7 6.6 6.5(H) 6.6(H)        Exercise Target Goals: Exercise Program Goal: Individual exercise prescription set using results from initial 6 min walk test and THRR while considering  patient's activity barriers and safety.   Exercise Prescription Goal: Initial exercise prescription builds to 30-45 minutes a day of aerobic activity, 2-3 days per week.  Home exercise guidelines will be given to  patient during program as part of exercise prescription that the participant will acknowledge.   Education: Aerobic Exercise: - Group verbal and visual presentation on the components of exercise prescription. Introduces F.I.T.T principle from ACSM for exercise prescriptions.  Reviews F.I.T.T. principles of aerobic exercise including progression. Written material given at graduation.   Education: Resistance Exercise: - Group verbal and visual presentation on the components of exercise prescription. Introduces F.I.T.T principle from ACSM for exercise prescriptions  Reviews F.I.T.T. principles of resistance exercise including progression. Written material given at graduation.    Education: Exercise & Equipment Safety: - Individual verbal instruction and demonstration of equipment use and safety with use of the equipment. Flowsheet Row Cardiac Rehab from 01/19/2021 in [facility] Cardiac and Pulmonary Rehab  Education need identified 01/19/21  Date 01/19/21  Educator [place]  Instruction Review Code 1- Verbalizes Understanding       Education: Exercise Physiology & General Exercise Guidelines: - Group verbal and written instruction with models to review the exercise physiology of the cardiovascular system and associated critical values. Provides general exercise guidelines with specific guidelines to those with heart or lung disease.  Flowsheet Row Cardiac Rehab from 01/19/2021 in [facility] Cardiac and Pulmonary Rehab  Education need identified 01/19/21       Education: Flexibility, Balance, Mind/Body Relaxation: - Group verbal and visual presentation with interactive activity on the components of exercise prescription. Introduces F.I.T.T principle from ACSM for exercise prescriptions. Reviews F.I.T.T. principles of flexibility and balance exercise training including progression. Also discusses the mind body connection.  Reviews various relaxation techniques to help reduce and manage stress (i.e. Deep  breathing, progressive muscle relaxation, and visualization). Balance handout provided to take home. Written material given at graduation.   Activity Barriers & Risk Stratification:  Activity Barriers & Cardiac Risk Stratification - 01/19/21 1245       Activity Barriers & Cardiac Risk Stratification   Activity Barriers Other (comment);Deconditioning;Back Problems;Joint Problems;Left Hip Replacement;Right Hip Replacement;Muscular Weakness;Assistive Device    Comments neuropathy; pain stimulator inserted April 2021 for back; 3 hip replacements, neuropathy in legs    Cardiac Risk Stratification High             6 Minute Walk:  6 Minute Walk     Row Name 01/19/21 1250         6 Minute Walk   Phase Initial     Distance 550 feet     Walk Time 6 minutes     # of Rest Breaks 0     MPH 1.04  METS 1.2     RPE 12     Perceived Dyspnea  1     VO2 Peak 4.21     Symptoms Yes (comment)     Comments Fatigue, Back pain 4/10     Resting HR 58 bpm     Resting BP 138/72     Resting Oxygen Saturation  99 %     Exercise Oxygen Saturation  during 6 min walk 98 %     Max Ex. HR 79 bpm     Max Ex. BP 146/70     2 Minute Post BP 140/72              Oxygen Initial Assessment:   Oxygen Re-Evaluation:   Oxygen Discharge (Final Oxygen Re-Evaluation):   Initial Exercise Prescription:  Initial Exercise Prescription - 01/19/21 1200       Date of Initial Exercise RX and Referring Provider   Date 01/19/21    Referring Provider Paraschos, Alexander MD      Oxygen   Maintain Oxygen Saturation 88% or higher      Treadmill   MPH 1    Grade 0    Minutes 15    METs 1.8      Recumbant Bike   Level 1    RPM 60    Watts 15    Minutes 15    METs 1      NuStep   Level 1    SPM 80    Minutes 15    METs 1      REL-XR   Level 1    Speed 50    Minutes 15    METs 1      Prescription Details   Frequency (times per week) 2    Duration Progress to 30 minutes of  continuous aerobic without signs/symptoms of physical distress      Intensity   THRR 40-80% of Max Heartrate 91-125    Ratings of Perceived Exertion 11-13    Perceived Dyspnea 0-4      Progression   Progression Continue to progress workloads to maintain intensity without signs/symptoms of physical distress.      Resistance Training   Training Prescription Yes    Weight 3 lb    Reps 10-15             Perform Capillary Blood Glucose checks as needed.  Exercise Prescription Changes:   Exercise Prescription Changes     Row Name 01/19/21 1200             Response to Exercise   Blood Pressure (Admit) 138/72       Blood Pressure (Exercise) 146/70       Blood Pressure (Exit) 140/72       Heart Rate (Admit) 58 bpm       Heart Rate (Exercise) 79 bpm       Heart Rate (Exit) 62 bpm       Oxygen Saturation (Admit) 99 %       Oxygen Saturation (Exercise) 98 %       Oxygen Saturation (Exit) 98 %       Rating of Perceived Exertion (Exercise) 12       Perceived Dyspnea (Exercise) 1       Symptoms Fatigue, back pain 4/10       Comments walk test results                Exercise Comments:   Exercise Goals and  Review:   Exercise Goals     Row Name 01/19/21 1259             Exercise Goals   Increase Physical Activity Yes       Intervention Provide advice, education, support and counseling about physical activity/exercise needs.;Develop an individualized exercise prescription for aerobic and resistive training based on initial evaluation findings, risk stratification, comorbidities and participant's personal goals.       Expected Outcomes Short Term: Attend rehab on a regular basis to increase amount of physical activity.;Long Term: Add in home exercise to make exercise part of routine and to increase amount of physical activity.;Long Term: Exercising regularly at least 3-5 days a week.       Increase Strength and Stamina Yes       Intervention Provide advice,  education, support and counseling about physical activity/exercise needs.;Develop an individualized exercise prescription for aerobic and resistive training based on initial evaluation findings, risk stratification, comorbidities and participant's personal goals.       Expected Outcomes Short Term: Increase workloads from initial exercise prescription for resistance, speed, and METs.;Short Term: Perform resistance training exercises routinely during rehab and add in resistance training at home;Long Term: Improve cardiorespiratory fitness, muscular endurance and strength as measured by increased METs and functional capacity (6MWT)       Able to understand and use rate of perceived exertion (RPE) scale Yes       Intervention Provide education and explanation on how to use RPE scale       Expected Outcomes Short Term: Able to use RPE daily in rehab to express subjective intensity level;Long Term:  Able to use RPE to guide intensity level when exercising independently       Able to understand and use Dyspnea scale Yes       Intervention Provide education and explanation on how to use Dyspnea scale       Expected Outcomes Short Term: Able to use Dyspnea scale daily in rehab to express subjective sense of shortness of breath during exertion;Long Term: Able to use Dyspnea scale to guide intensity level when exercising independently       Knowledge and understanding of Target Heart Rate Range (THRR) Yes       Intervention Provide education and explanation of THRR including how the numbers were predicted and where they are located for reference       Expected Outcomes Short Term: Able to state/look up THRR;Long Term: Able to use THRR to govern intensity when exercising independently;Short Term: Able to use daily as guideline for intensity in rehab       Able to check pulse independently Yes       Intervention Provide education and demonstration on how to check pulse in carotid and radial arteries.;Review the  importance of being able to check your own pulse for safety during independent exercise       Expected Outcomes Short Term: Able to explain why pulse checking is important during independent exercise;Long Term: Able to check pulse independently and accurately       Understanding of Exercise Prescription Yes       Intervention Provide education, explanation, and written materials on patient's individual exercise prescription       Expected Outcomes Short Term: Able to explain program exercise prescription;Long Term: Able to explain home exercise prescription to exercise independently                Exercise Goals Re-Evaluation :   Discharge Exercise  Prescription (Final Exercise Prescription Changes):  Exercise Prescription Changes - 01/19/21 1200       Response to Exercise   Blood Pressure (Admit) 138/72    Blood Pressure (Exercise) 146/70    Blood Pressure (Exit) 140/72    Heart Rate (Admit) 58 bpm    Heart Rate (Exercise) 79 bpm    Heart Rate (Exit) 62 bpm    Oxygen Saturation (Admit) 99 %    Oxygen Saturation (Exercise) 98 %    Oxygen Saturation (Exit) 98 %    Rating of Perceived Exertion (Exercise) 12    Perceived Dyspnea (Exercise) 1    Symptoms Fatigue, back pain 4/10    Comments walk test results             Nutrition:  Target Goals: Understanding of nutrition guidelines, daily intake of sodium 1500mg , cholesterol 200mg , calories 30% from fat and 7% or less from saturated fats, daily to have 5 or more servings of fruits and vegetables.  Education: All About Nutrition: -Group instruction provided by verbal, written material, interactive activities, discussions, models, and posters to present general guidelines for heart healthy nutrition including fat, fiber, MyPlate, the role of sodium in heart healthy nutrition, utilization of the nutrition label, and utilization of this knowledge for meal planning. Follow up email sent as well. Written material given at  graduation.   Biometrics:  Pre Biometrics - 01/19/21 1245       Pre Biometrics   Height 5' 11.5" (1.816 m)    Weight 189 lb 11.2 oz (86 kg)    BMI (Calculated) 26.09    Single Leg Stand 2.3 seconds              Nutrition Therapy Plan and Nutrition Goals:  Nutrition Therapy & Goals - 01/19/21 1246       Intervention Plan   Intervention Prescribe, educate and counsel regarding individualized specific dietary modifications aiming towards targeted core components such as weight, hypertension, lipid management, diabetes, heart failure and other comorbidities.    Expected Outcomes Short Term Goal: Understand basic principles of dietary content, such as calories, fat, sodium, cholesterol and nutrients.;Short Term Goal: A plan has been developed with personal nutrition goals set during dietitian appointment.;Long Term Goal: Adherence to prescribed nutrition plan.             Nutrition Assessments:  MEDIFICTS Score Key: ?70 Need to make dietary changes  40-70 Heart Healthy Diet ? 40 Therapeutic Level Cholesterol Diet  Flowsheet Row Cardiac Rehab from 01/19/2021 in [facility] Cardiac and Pulmonary Rehab  Picture Your Plate Total Score on Admission 51      Picture Your Plate Scores: <03 Unhealthy dietary pattern with much room for improvement. 41-50 Dietary pattern unlikely to meet recommendations for good health and room for improvement. 51-60 More healthful dietary pattern, with some room for improvement.  >60 Healthy dietary pattern, although there may be some specific behaviors that could be improved.    Nutrition Goals Re-Evaluation:   Nutrition Goals Discharge (Final Nutrition Goals Re-Evaluation):   Psychosocial: Target Goals: Acknowledge presence or absence of significant depression and/or stress, maximize coping skills, provide positive support system. Participant is able to verbalize types and ability to use techniques and skills needed for reducing stress and  depression.   Education: Stress, Anxiety, and Depression - Group verbal and visual presentation to define topics covered.  Reviews how body is impacted by stress, anxiety, and depression.  Also discusses healthy ways to reduce stress and to treat/manage anxiety and  depression.  Written material given at graduation.   Education: Sleep Hygiene -Provides group verbal and written instruction about how sleep can affect your health.  Define sleep hygiene, discuss sleep cycles and impact of sleep habits. Review good sleep hygiene tips.    Initial Review & Psychosocial Screening:  Initial Psych Review & Screening - 12/31/20 1315       Initial Review   Current issues with Current Sleep Concerns      Family Dynamics   Good Support System? Yes   Wife, daughter, neighbors     Barriers   Psychosocial barriers to participate in program There are no identifiable barriers or psychosocial needs.;The patient should benefit from training in stress management and relaxation.      Screening Interventions   Interventions Encouraged to exercise;Provide feedback about the scores to participant;To provide support and resources with identified psychosocial needs    Expected Outcomes Short Term goal: Utilizing psychosocial counselor, staff and physician to assist with identification of specific Stressors or current issues interfering with healing process. Setting desired goal for each stressor or current issue identified.;Long Term Goal: Stressors or current issues are controlled or eliminated.;Short Term goal: Identification and review with participant of any Quality of Life or Depression concerns found by scoring the questionnaire.;Long Term goal: The participant improves quality of Life and PHQ9 Scores as seen by post scores and/or verbalization of changes             Quality of Life Scores:   Quality of Life - 01/19/21 1255       Quality of Life   Select Quality of Life      Quality of Life Scores    Health/Function Pre 14.6 %    Socioeconomic Pre 28 %    Psych/Spiritual Pre 29.64 %    Family Pre 15.6 %    GLOBAL Pre 20.14 %            Scores of 19 and below usually indicate a poorer quality of life in these areas.  A difference of  2-3 points is a clinically meaningful difference.  A difference of 2-3 points in the total score of the Quality of Life Index has been associated with significant improvement in overall quality of life, self-image, physical symptoms, and general health in studies assessing change in quality of life.  PHQ-9: Recent Review Flowsheet Data     Depression screen [facility] 2/9 01/19/2021 10/25/2020 09/10/2020 02/26/2020 09/08/2019   Decreased Interest 0 0 0 0 0   Down, Depressed, Hopeless 0 0 0 0 0   PHQ - 2 Score 0 0 0 0 0   Altered sleeping 0 - - - -   Tired, decreased energy 2 - - - -   Change in appetite 0 - - - -   Feeling bad or failure about yourself  0 - - - -   Trouble concentrating 0 - - - -   Moving slowly or fidgety/restless 0 - - - -   Suicidal thoughts 0 - - - -   PHQ-9 Score 2 - - - -   Difficult doing work/chores Not difficult at all - - - -      Interpretation of Total Score  Total Score Depression Severity:  1-4 = Minimal depression, 5-9 = Mild depression, 10-14 = Moderate depression, 15-19 = Moderately severe depression, 20-27 = Severe depression   Psychosocial Evaluation and Intervention:  Psychosocial Evaluation - 12/31/20 1326       Psychosocial Evaluation &  Interventions   Interventions Encouraged to exercise with the program and follow exercise prescription    Comments Mr. Mabus reports not feeling well and being very tired post NSTEMI w/ stent. His sleep has been altered due to his shortness of breath while lying down. He is in contact with his doctors and sees his hematologist next week to check up on his anemia.  He states the heart issues came as a surprise to him so he is motivated to work on his health. His friend did the  program a while back and is encouraging him to participate as well. His wife and daughter (who moved in with them after their latest health issues) are very supportive. 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 It makes it hard for him to walk and participate in things. He is hopeful this program will help boost his stamina and help create a plan for future exercise and heart healthy living.    Expected Outcomes Short: attend cardiac rehab for education and exercise. Long: develop and maintain positive self care habits.    Continue Psychosocial Services  Follow up required by staff             Psychosocial Re-Evaluation:   Psychosocial Discharge (Final Psychosocial Re-Evaluation):   Vocational Rehabilitation: Provide vocational rehab assistance to qualifying candidates.   Vocational Rehab Evaluation & Intervention:  Vocational Rehab - 12/31/20 1312       Initial Vocational Rehab Evaluation & Intervention   Assessment shows need for Vocational Rehabilitation No             Education: Education Goals: Education classes will be provided on a variety of topics geared toward better understanding of heart health and risk factor modification. Participant will state understanding/return demonstration of topics presented as noted by education test scores.  Learning Barriers/Preferences:  Learning Barriers/Preferences - 12/31/20 1312       Learning Barriers/Preferences   Learning Barriers None    Learning Preferences None             General Cardiac Education Topics:  AED/CPR: - Group verbal and written instruction with the use of models to demonstrate the basic use of the AED with the basic ABC's of resuscitation.   Anatomy and Cardiac Procedures: - Group verbal and visual presentation and models provide information about basic cardiac anatomy and function. Reviews the testing methods done to diagnose heart disease and the  outcomes of the test results. Describes the treatment choices: Medical Management, Angioplasty, or Coronary Bypass Surgery for treating various heart conditions including Myocardial Infarction, Angina, Valve Disease, and Cardiac Arrhythmias.  Written material given at graduation.   Medication Safety: - Group verbal and visual instruction to review commonly prescribed medications for heart and lung disease. Reviews the medication, class of the drug, and side effects. Includes the steps to properly store meds and maintain the prescription regimen.  Written material given at graduation.   Intimacy: - Group verbal instruction through game format to discuss how heart and lung disease can affect sexual intimacy. Written material given at graduation..   Know Your Numbers and Heart Failure: - Group verbal and visual instruction to discuss disease risk factors for cardiac and pulmonary disease and treatment options.  Reviews associated critical values for Overweight/Obesity, Hypertension, Cholesterol, and Diabetes.  Discusses basics of heart failure: signs/symptoms and treatments.  Introduces Heart Failure Zone chart for action plan for heart failure.  Written material given at graduation. Flowsheet Row Cardiac  Rehab from 01/19/2021 in [facility] Cardiac and Pulmonary Rehab  Education need identified 01/19/21       Infection Prevention: - Provides verbal and written material to individual with discussion of infection control including proper hand washing and proper equipment cleaning during exercise session. Flowsheet Row Cardiac Rehab from 01/19/2021 in [facility] Cardiac and Pulmonary Rehab  Education need identified 01/19/21  Date 01/19/21  Educator [place]  Instruction Review Code 1- Verbalizes Understanding       Falls Prevention: - Provides verbal and written material to individual with discussion of falls prevention and safety. Flowsheet Row Cardiac Rehab from 01/19/2021 in [facility] Cardiac and Pulmonary Rehab   Education need identified 01/19/21  Date 01/19/21  Educator [place]  Instruction Review Code 1- Verbalizes Understanding       Other: -Provides group and verbal instruction on various topics (see comments)   Knowledge Questionnaire Score:  Knowledge Questionnaire Score - 01/19/21 1244       Knowledge Questionnaire Score   Pre Score 22/26: MI, Nitro, Exercise, A&P             Core Components/Risk Factors/Patient Goals at Admission:  Personal Goals and Risk Factors at Admission - 01/19/21 1300       Core Components/Risk Factors/Patient Goals on Admission    Weight Management Yes;Weight Maintenance    Intervention Weight Management: Develop a combined nutrition and exercise program designed to reach desired caloric intake, while maintaining appropriate intake of nutrient and fiber, sodium and fats, and appropriate energy expenditure required for the weight goal.;Weight Management: Provide education and appropriate resources to help participant work on and attain dietary goals.;Weight Management/Obesity: Establish reasonable short term and long term weight goals.    Admit Weight 189 lb (85.7 kg)    Goal Weight: Short Term 189 lb (85.7 kg)    Goal Weight: Long Term 189 lb (85.7 kg)    Expected Outcomes Short Term: Continue to assess and modify interventions until short term weight is achieved;Long Term: Adherence to nutrition and physical activity/exercise program aimed toward attainment of established weight goal;Weight Maintenance: Understanding of the daily nutrition guidelines, which includes 25-35% calories from fat, 7% or less cal from saturated fats, less than 200mg  cholesterol, less than 1.5gm of sodium, & 5 or more servings of fruits and vegetables daily;Understanding recommendations for meals to include 15-35% energy as protein, 25-35% energy from fat, 35-60% energy from carbohydrates, less than 200mg  of dietary cholesterol, 20-35 gm of total fiber daily;Understanding of  distribution of calorie intake throughout the day with the consumption of 4-5 meals/snacks    Diabetes Yes    Intervention Provide education about signs/symptoms and action to take for hypo/hyperglycemia.;Provide education about proper nutrition, including hydration, and aerobic/resistive exercise prescription along with prescribed medications to achieve blood glucose in normal ranges: Fasting glucose 65-99 mg/dL    Expected Outcomes Short Term: Participant verbalizes understanding of the signs/symptoms and immediate care of hyper/hypoglycemia, proper foot care and importance of medication, aerobic/resistive exercise and nutrition plan for blood glucose control.;Long Term: Attainment of HbA1C < 7%.    Hypertension Yes    Intervention Provide education on lifestyle modifcations including regular physical activity/exercise, weight management, moderate sodium restriction and increased consumption of fresh fruit, vegetables, and low fat dairy, alcohol moderation, and smoking cessation.;Monitor prescription use compliance.    Expected Outcomes Short Term: Continued assessment and intervention until BP is < 140/36mm HG in hypertensive participants. < 130/9mm HG in hypertensive participants with diabetes, heart failure or chronic kidney disease.;Long Term: Maintenance of  blood pressure at goal levels.    Lipids Yes    Intervention Provide education and support for participant on nutrition & aerobic/resistive exercise along with prescribed medications to achieve LDL 70mg , HDL >40mg .    Expected Outcomes Short Term: Participant states understanding of desired cholesterol values and is compliant with medications prescribed. Participant is following exercise prescription and nutrition guidelines.;Long Term: Cholesterol controlled with medications as prescribed, with individualized exercise RX and with personalized nutrition plan. Value goals: LDL < 70mg , HDL > 40 mg.             Education:Diabetes -  Individual verbal and written instruction to review signs/symptoms of diabetes, desired ranges of glucose level fasting, after meals and with exercise. Acknowledge that pre and post exercise glucose checks will be done for 3 sessions at entry of program. [place] from 01/19/2021 in [facility] Card[facility] Re[facility]  Education need identified 01/19/21  Date 01/19/21  Educator [place]  Instruction Review Code 1- Verbalizes Understanding       Core Components/Risk Factors/Patient Goals Review:    Core Components/Risk Factors/Patient Goals at Discharge (Final Review):    ITP Comments:  ITP Comments     Ang[place] Name 12/31/20 1303 01/19/21 1309         ITP Comments Initial telephone orientation completed. Diagnosis can be found in [facility] 11/7. EP orientation scheduled for Wednesday 12/7 at 11am. Completed 6MWT and gym orientation. Initial ITP created and sent for review to Dr. Emily Filbert, Medical Director.               Comments: Initial ITP

## 2021-01-19 NOTE — Patient Instructions (Signed)
Patient Instructions  Patient Details  Name: Sean Reyes. MRN: [id number] Date of Birth: 12-Mar-1940 Referring Provider:  [name], MD  Below are your personal goals for exercise, nutrition, and risk factors. Our goal is to help you stay on track towards obtaining and maintaining these goals. We will be discussing your progress on these goals with you throughout the program.  Initial Exercise Prescription:  Initial Exercise Prescription - 01/19/21 1200       Date of Initial Exercise RX and Referring Provider   Date 01/19/21    Referring Provider Paraschos, Alexander MD      Oxygen   Maintain Oxygen Saturation 88% or higher      Treadmill   MPH 1    Grade 0    Minutes 15    METs 1.8      Recumbant Bike   Level 1    RPM 60    Watts 15    Minutes 15    METs 1      NuStep   Level 1    SPM 80    Minutes 15    METs 1      REL-XR   Level 1    Speed 50    Minutes 15    METs 1      Prescription Details   Frequency (times per week) 2    Duration Progress to 30 minutes of continuous aerobic without signs/symptoms of physical distress      Intensity   THRR 40-80% of Max Heartrate 91-125    Ratings of Perceived Exertion 11-13    Perceived Dyspnea 0-4      Progression   Progression Continue to progress workloads to maintain intensity without signs/symptoms of physical distress.      Resistance Training   Training Prescription Yes    Weight 3 lb    Reps 10-15             Exercise Goals: Frequency: Be able to perform aerobic exercise two to three times per week in program working toward 2-5 days per week of home exercise.  Intensity: Work with a perceived exertion of 11 (fairly light) - 15 (hard) while following your exercise prescription.  We will make changes to your prescription with you as you progress through the program.   Duration: Be able to do 30 to 45 minutes of continuous aerobic exercise in addition to a 5 minute warm-up and a 5  minute cool-down routine.   Nutrition Goals: Your personal nutrition goals will be established when you do your nutrition analysis with the dietician.  The following are general nutrition guidelines to follow: Cholesterol < 200mg /day Sodium < 1500mg /day Fiber: Men over 50 yrs - 30 grams per day  Personal Goals:  Personal Goals and Risk Factors at Admission - 01/19/21 1300       Core Components/Risk Factors/Patient Goals on Admission    Weight Management Yes;Weight Maintenance    Intervention Weight Management: Develop a combined nutrition and exercise program designed to reach desired caloric intake, while maintaining appropriate intake of nutrient and fiber, sodium and fats, and appropriate energy expenditure required for the weight goal.;Weight Management: Provide education and appropriate resources to help participant work on and attain dietary goals.;Weight Management/Obesity: Establish reasonable short term and long term weight goals.    Admit Weight 189 lb (85.7 kg)    Goal Weight: Short Term 189 lb (85.7 kg)    Goal Weight: Long Term 189 lb (85.7 kg)  Expected Outcomes Short Term: Continue to assess and modify interventions until short term weight is achieved;Long Term: Adherence to nutrition and physical activity/exercise program aimed toward attainment of established weight goal;Weight Maintenance: Understanding of the daily nutrition guidelines, which includes 25-35% calories from fat, 7% or less cal from saturated fats, less than 200mg  cholesterol, less than 1.5gm of sodium, & 5 or more servings of fruits and vegetables daily;Understanding recommendations for meals to include 15-35% energy as protein, 25-35% energy from fat, 35-60% energy from carbohydrates, less than 200mg  of dietary cholesterol, 20-35 gm of total fiber daily;Understanding of distribution of calorie intake throughout the day with the consumption of 4-5 meals/snacks    Diabetes Yes    Intervention Provide education  about signs/symptoms and action to take for hypo/hyperglycemia.;Provide education about proper nutrition, including hydration, and aerobic/resistive exercise prescription along with prescribed medications to achieve blood glucose in normal ranges: Fasting glucose 65-99 mg/dL    Expected Outcomes Short Term: Participant verbalizes understanding of the signs/symptoms and immediate care of hyper/hypoglycemia, proper foot care and importance of medication, aerobic/resistive exercise and nutrition plan for blood glucose control.;Long Term: Attainment of HbA1C < 7%.    Hypertension Yes    Intervention Provide education on lifestyle modifcations including regular physical activity/exercise, weight management, moderate sodium restriction and increased consumption of fresh fruit, vegetables, and low fat dairy, alcohol moderation, and smoking cessation.;Monitor prescription use compliance.    Expected Outcomes Short Term: Continued assessment and intervention until BP is < 140/55mm HG in hypertensive participants. < 130/73mm HG in hypertensive participants with diabetes, heart failure or chronic kidney disease.;Long Term: Maintenance of blood pressure at goal levels.    Lipids Yes    Intervention Provide education and support for participant on nutrition & aerobic/resistive exercise along with prescribed medications to achieve LDL 70mg , HDL >40mg .    Expected Outcomes Short Term: Participant states understanding of desired cholesterol values and is compliant with medications prescribed. Participant is following exercise prescription and nutrition guidelines.;Long Term: Cholesterol controlled with medications as prescribed, with individualized exercise RX and with personalized nutrition plan. Value goals: LDL < 70mg , HDL > 40 mg.             Tobacco Use Initial Evaluation: Social History   Tobacco Use  Smoking Status Never  Smokeless Tobacco Never    Exercise Goals and Review:  Exercise Goals     Row  Name 01/19/21 1259             Exercise Goals   Increase Physical Activity Yes       Intervention Provide advice, education, support and counseling about physical activity/exercise needs.;Develop an individualized exercise prescription for aerobic and resistive training based on initial evaluation findings, risk stratification, comorbidities and participant's personal goals.       Expected Outcomes Short Term: Attend rehab on a regular basis to increase amount of physical activity.;Long Term: Add in home exercise to make exercise part of routine and to increase amount of physical activity.;Long Term: Exercising regularly at least 3-5 days a week.       Increase Strength and Stamina Yes       Intervention Provide advice, education, support and counseling about physical activity/exercise needs.;Develop an individualized exercise prescription for aerobic and resistive training based on initial evaluation findings, risk stratification, comorbidities and participant's personal goals.       Expected Outcomes Short Term: Increase workloads from initial exercise prescription for resistance, speed, and METs.;Short Term: Perform resistance training exercises routinely during rehab and  add in resistance training at home;Long Term: Improve cardiorespiratory fitness, muscular endurance and strength as measured by increased METs and functional capacity (6MWT)       Able to understand and use rate of perceived exertion (RPE) scale Yes       Intervention Provide education and explanation on how to use RPE scale       Expected Outcomes Short Term: Able to use RPE daily in rehab to express subjective intensity level;Long Term:  Able to use RPE to guide intensity level when exercising independently       Able to understand and use Dyspnea scale Yes       Intervention Provide education and explanation on how to use Dyspnea scale       Expected Outcomes Short Term: Able to use Dyspnea scale daily in rehab to express  subjective sense of shortness of breath during exertion;Long Term: Able to use Dyspnea scale to guide intensity level when exercising independently       Knowledge and understanding of Target Heart Rate Range (THRR) Yes       Intervention Provide education and explanation of THRR including how the numbers were predicted and where they are located for reference       Expected Outcomes Short Term: Able to state/look up THRR;Long Term: Able to use THRR to govern intensity when exercising independently;Short Term: Able to use daily as guideline for intensity in rehab       Able to check pulse independently Yes       Intervention Provide education and demonstration on how to check pulse in carotid and radial arteries.;Review the importance of being able to check your own pulse for safety during independent exercise       Expected Outcomes Short Term: Able to explain why pulse checking is important during independent exercise;Long Term: Able to check pulse independently and accurately       Understanding of Exercise Prescription Yes       Intervention Provide education, explanation, and written materials on patient's individual exercise prescription       Expected Outcomes Short Term: Able to explain program exercise prescription;Long Term: Able to explain home exercise prescription to exercise independently                Copy of goals given to participant.

## 2021-01-24 ENCOUNTER — Other Ambulatory Visit: Payer: Self-pay

## 2021-01-24 DIAGNOSIS — I214 Non-ST elevation (NSTEMI) myocardial infarction: Secondary | ICD-10-CM

## 2021-01-24 DIAGNOSIS — Z955 Presence of coronary angioplasty implant and graft: Secondary | ICD-10-CM

## 2021-01-24 LAB — GLUCOSE, CAPILLARY
Glucose-Capillary: 196 mg/dL — ABNORMAL HIGH (ref 70–99)
Glucose-Capillary: 147 mg/dL — ABNORMAL HIGH (ref 70–99)

## 2021-01-24 NOTE — Progress Notes (Signed)
Daily Session Note  Patient Details  Name: Sean Reyes. MRN: [id number] Date of Birth: February 17, 1940 Referring Provider:   Flowsheet Row Cardiac Rehab from 01/19/2021 in [facility] Card[facility]  Referring Provider [name] MD       Encounter Date: 01/24/2021  Check In:  Session Check In - 01/24/21 1352       Check-In   Supervising physician immediately available to respond to emergencies See telemetry face sheet for immediately available ER MD    Location [facility]-Cardiac & Pulmonary Rehab    Staff Present Birdie Sons, MPA, RN;Joseph Tessie Fass, Sharren [place], MS, ASCM CEP, Exercise Physiologist    Virtual Visit No    Medication changes reported     No    Fall or balance concerns reported    No    Warm-up and Cool-down Performed on first and last piece of equipment    Resistance Training Performed Yes    VAD Patient? No    PAD/SET Patient? No      Pain Assessment   Currently in Pain? No/denies                Social History   Tobacco Use  Smoking Status Never  Smokeless Tobacco Never    Goals Met:  Independence with exercise equipment Exercise tolerated well No report of concerns or symptoms today Strength training completed today  Goals Unmet:  Not Applicable  Comments: First full day of exercise!  Patient was oriented to gym and equipment including functions, settings, policies, and procedures.  Patient's individual exercise prescription and treatment plan were reviewed.  All starting workloads were established based on the results of the 6 minute walk test done at initial orientation visit.  The plan for exercise progression was also introduced and progression will be customized based on patient's performance and goals.     Dr. Emily Filbert is Medical Director for [place].  Dr. Ottie Glazier is Medical Director for [facility].

## 2021-01-26 ENCOUNTER — Inpatient Hospital Stay: Payer: Medicare Other

## 2021-01-26 ENCOUNTER — Telehealth: Payer: Self-pay | Admitting: Internal Medicine

## 2021-01-26 NOTE — Telephone Encounter (Signed)
Pt called to reschedule his appt for today. Calll back at [phone]

## 2021-01-27 ENCOUNTER — Other Ambulatory Visit: Payer: Self-pay

## 2021-01-27 ENCOUNTER — Encounter: Payer: Medicare Other | Admitting: *Deleted

## 2021-01-27 DIAGNOSIS — Z955 Presence of coronary angioplasty implant and graft: Secondary | ICD-10-CM

## 2021-01-27 DIAGNOSIS — I214 Non-ST elevation (NSTEMI) myocardial infarction: Secondary | ICD-10-CM

## 2021-01-27 LAB — GLUCOSE, CAPILLARY
Glucose-Capillary: 142 mg/dL — ABNORMAL HIGH (ref 70–99)
Glucose-Capillary: 116 mg/dL — ABNORMAL HIGH (ref 70–99)

## 2021-01-27 NOTE — Progress Notes (Signed)
Daily Session Note ° °Patient Details  °Name: Sean Reyes. °MRN: [id number] °Date of Birth: 04/18/1940 °Referring Provider:   °Flowsheet Row Cardiac Rehab from 01/19/2021 in [facility] Cardiac and Pulmonary Rehab  °Referring Provider Paraschos, Alexander MD  ° °  ° ° °Encounter Date: 01/27/2021 ° °Check In: ° Session Check In - 01/27/21 1357   ° °  ° Check-In  ° Supervising physician immediately available to respond to emergencies See telemetry face sheet for immediately available ER MD   ° Location [facility]-Cardiac & Pulmonary Rehab   ° Staff Present Meredith Craven, RN BSN;Melissa Caiola, RDN, LDN;Jessica Hawkins, MA, RCEP, CCRP, CCET   ° Virtual Visit No   ° Medication changes reported     No   ° Fall or balance concerns reported    No   ° Warm-up and Cool-down Performed on first and last piece of equipment   ° Resistance Training Performed Yes   ° VAD Patient? No   ° PAD/SET Patient? No   °  ° Pain Assessment  ° Currently in Pain? No/denies   ° °  °  ° °  ° ° ° ° ° °Social History  ° °Tobacco Use  °Smoking Status Never  °Smokeless Tobacco Never  ° ° °Goals Met:  °Independence with exercise equipment °Exercise tolerated well °No report of concerns or symptoms today °Strength training completed today ° °Goals Unmet:  °Not Applicable ° °Comments: Pt able to follow exercise prescription today without complaint.  Will continue to monitor for progression. ° ° ° °Dr. Mark Miller is Medical Director for [facility].  °Dr. Fuad Aleskerov is Medical Director for [facility]. °

## 2021-01-28 ENCOUNTER — Other Ambulatory Visit: Payer: Self-pay

## 2021-01-28 ENCOUNTER — Encounter: Payer: Self-pay | Admitting: Internal Medicine

## 2021-01-28 ENCOUNTER — Inpatient Hospital Stay: Payer: Medicare Other | Attending: Internal Medicine

## 2021-01-28 VITALS — BP 132/68 | HR 60 | Temp 98.9°F | Resp 18

## 2021-01-28 DIAGNOSIS — D509 Iron deficiency anemia, unspecified: Secondary | ICD-10-CM | POA: Insufficient documentation

## 2021-01-28 DIAGNOSIS — D649 Anemia, unspecified: Secondary | ICD-10-CM

## 2021-01-28 MED ORDER — IRON SUCROSE 20 MG/ML IV SOLN
200.0000 mg | Freq: Once | INTRAVENOUS | Status: AC
  Administered 2021-01-28: 200 mg via INTRAVENOUS
  Filled 2021-01-28: qty 10

## 2021-01-28 MED ORDER — SODIUM CHLORIDE 0.9 % IV SOLN
Freq: Once | INTRAVENOUS | Status: AC
  Filled 2021-01-28: qty 250

## 2021-01-28 MED ORDER — SODIUM CHLORIDE 0.9 % IV SOLN: 200.0000 mg | Freq: Once | INTRAVENOUS | Status: DC

## 2021-01-28 NOTE — Progress Notes (Signed)
Pt did not show for scheduled appointment.  

## 2021-01-28 NOTE — Progress Notes (Signed)
Pt received IV venofer in clinic today. VSS @ d/c. 

## 2021-01-28 NOTE — Patient Instructions (Signed)
[facility] [place]   °Discharge Instructions: °Thank you for choosing [facility] to provide your oncology and hematology care.  °If you have a lab appointment with the Cancer Center, please go directly to the Cancer Center and check in at the registration area. ° °Wear comfortable clothing and clothing appropriate for easy access to any Portacath or PICC line.  ° °We strive to give you quality time with your provider. You may need to reschedule your appointment if you arrive late (15 or more minutes).  Arriving late affects you and other patients whose appointments are after yours.  Also, if you miss three or more appointments without notifying the office, you may be dismissed from the clinic at the provider’s discretion.    °  °For prescription refill requests, have your pharmacy contact our office and allow 72 hours for refills to be completed.   ° °BELOW ARE SYMPTOMS THAT SHOULD BE REPORTED IMMEDIATELY: °*FEVER GREATER THAN 100.4 F (38 °C) OR HIGHER °*CHILLS OR SWEATING °*NAUSEA AND VOMITING THAT IS NOT CONTROLLED WITH YOUR NAUSEA MEDICATION °*UNUSUAL SHORTNESS OF BREATH °*UNUSUAL BRUISING OR BLEEDING °*URINARY PROBLEMS (pain or burning when urinating, or frequent urination) °*BOWEL PROBLEMS (unusual diarrhea, constipation, pain near the anus) °TENDERNESS IN MOUTH AND THROAT WITH OR WITHOUT PRESENCE OF ULCERS (sore throat, sores in mouth, or a toothache) °UNUSUAL RASH, SWELLING OR PAIN  °UNUSUAL VAGINAL DISCHARGE OR ITCHING  ° °Items with * indicate a potential emergency and should be followed up as soon as possible or go to the Emergency Department if any problems should occur. ° °Please show the CHEMOTHERAPY ALERT CARD or IMMUNOTHERAPY ALERT CARD at check-in to the Emergency Department and triage nurse. ° °Should you have questions after your visit or need to cancel or reschedule your appointment, please contact [facility] [place]   [phone] and follow the prompts.  Office hours are 8:00 a.m. to 4:30 p.m. Monday - Friday. Please note that voicemails left after 4:00 p.m. may not be returned until the following business day.  We are closed weekends and major holidays. You have access to a nurse at all times for urgent questions. Please call the main number to the clinic [phone] and follow the prompts. ° °For any non-urgent questions, you may also contact your provider using MyChart. We now offer e-Visits for anyone 18 and older to request care online for non-urgent symptoms. For details visit [email or web address]. °  °Also download the MyChart app! Go to the app store, search "MyChart", open the app, select [facility], and log in with your MyChart username and password. ° °Due to Covid, a mask is required upon entering the hospital/clinic. If you do not have a mask, one will be given to you upon arrival. For doctor visits, patients may have 1 support person aged 18 or older with them. For treatment visits, patients cannot have anyone with them due to current Covid guidelines and our immunocompromised population.  ° °Iron Sucrose Injection °What is this medication? °IRON SUCROSE (EYE ern SOO krose) treats low levels of iron (iron deficiency anemia) in people with kidney disease. Iron is a mineral that plays an important role in making red blood cells, which carry oxygen from your lungs to the rest of your body. °This medicine may be used for other purposes; ask your health care provider or pharmacist if you have questions. °COMMON BRAND NAME(S): Venofer °What should I tell my care team before I take this medication? °They need   to know if you have any of these conditions: °Anemia not caused by low iron levels °Heart disease °High levels of iron in the blood °Kidney disease °Liver disease °An unusual or allergic reaction to iron, other medications, foods, dyes, or preservatives °Pregnant or trying to get pregnant °Breast-feeding °How  should I use this medication? °This medication is for infusion into a vein. It is given in a hospital or clinic setting. °Talk to your care team about the use of this medication in children. While this medication may be prescribed for children as young as 2 years for selected conditions, precautions do apply. °Overdosage: If you think you have taken too much of this medicine contact a poison control center or emergency room at once. °NOTE: This medicine is only for you. Do not share this medicine with others. °What if I miss a dose? °It is important not to miss your dose. Call your care team if you are unable to keep an appointment. °What may interact with this medication? °Do not take this medication with any of the following: °Deferoxamine °Dimercaprol °Other iron products °This medication may also interact with the following: °Chloramphenicol °Deferasirox °This list may not describe all possible interactions. Give your health care provider a list of all the medicines, herbs, non-prescription drugs, or dietary supplements you use. Also tell them if you smoke, drink alcohol, or use illegal drugs. Some items may interact with your medicine. °What should I watch for while using this medication? °Visit your care team regularly. Tell your care team if your symptoms do not start to get better or if they get worse. You may need blood work done while you are taking this medication. °You may need to follow a special diet. Talk to your care team. Foods that contain iron include: whole grains/cereals, dried fruits, beans, or peas, leafy green vegetables, and organ meats (liver, kidney). °What side effects may I notice from receiving this medication? °Side effects that you should report to your care team as soon as possible: °Allergic reactions--skin rash, itching, hives, swelling of the face, lips, tongue, or throat °Low blood pressure--dizziness, feeling faint or lightheaded, blurry vision °Shortness of breath °Side effects  that usually do not require medical attention (report to your care team if they continue or are bothersome): °Flushing °Headache °Joint pain °Muscle pain °Nausea °Pain, redness, or irritation at injection site °This list may not describe all possible side effects. Call your doctor for medical advice about side effects. You may report side effects to FDA at 1-800-FDA-1088. °Where should I keep my medication? °This medication is given in a hospital or clinic and will not be stored at home. °NOTE: This sheet is a summary. It may not cover all possible information. If you have questions about this medicine, talk to your doctor, pharmacist, or health care provider. °© 2022 [employer] (2020-06-25 00:00:00) ° °

## 2021-01-31 ENCOUNTER — Other Ambulatory Visit: Payer: Self-pay

## 2021-01-31 ENCOUNTER — Ambulatory Visit: Payer: Medicare Other | Admitting: Internal Medicine

## 2021-01-31 DIAGNOSIS — I214 Non-ST elevation (NSTEMI) myocardial infarction: Secondary | ICD-10-CM | POA: Diagnosis not present

## 2021-01-31 DIAGNOSIS — Z91199 Patient's noncompliance with other medical treatment and regimen due to unspecified reason: Secondary | ICD-10-CM

## 2021-01-31 DIAGNOSIS — Z955 Presence of coronary angioplasty implant and graft: Secondary | ICD-10-CM

## 2021-01-31 LAB — GLUCOSE, CAPILLARY
Glucose-Capillary: 195 mg/dL — ABNORMAL HIGH (ref 70–99)
Glucose-Capillary: 138 mg/dL — ABNORMAL HIGH (ref 70–99)

## 2021-01-31 NOTE — Progress Notes (Signed)
Daily Session Note  Patient Details  Name: Sean Reyes. MRN: [id number] Date of Birth: May 18, 1940 Referring Provider:   Flowsheet Row Cardiac Rehab from 01/19/2021 in [facility] Cardiac and Pulmonary Rehab  Referring Provider [name] MD       Encounter Date: 01/31/2021  Check In:  Session Check In - 01/31/21 1358       Check-In   Supervising physician immediately available to respond to emergencies See telemetry face sheet for immediately available ER MD    Location [facility]-Cardiac & Pulmonary Rehab    Staff Present Birdie Sons, MPA, Nino Glow, MS, ASCM CEP, Exercise Physiologist;Joseph Tessie Fass, [place]    Virtual Visit No    Medication changes reported     No    Fall or balance concerns reported    No    Warm-up and Cool-down Performed on first and last piece of equipment    Resistance Training Performed Yes    VAD Patient? No    PAD/SET Patient? No      Pain Assessment   Currently in Pain? No/denies                Social History   Tobacco Use  Smoking Status Never  Smokeless Tobacco Never    Goals Met:  Independence with exercise equipment Exercise tolerated well No report of concerns or symptoms today Strength training completed today  Goals Unmet:  Not Applicable  Comments: Pt able to follow exercise prescription today without complaint.  Will continue to monitor for progression.    Dr. Emily Filbert is Medical Director for [place].  Dr. Ottie Glazier is Medical Director for [facility] Pulmonary Rehabilitation.

## 2021-01-31 NOTE — Progress Notes (Signed)
Completed initial RD consultation ?

## 2021-02-02 ENCOUNTER — Inpatient Hospital Stay: Payer: Medicare Other

## 2021-02-03 ENCOUNTER — Encounter: Payer: Medicare Other | Admitting: *Deleted

## 2021-02-03 ENCOUNTER — Inpatient Hospital Stay: Payer: Medicare Other

## 2021-02-03 ENCOUNTER — Other Ambulatory Visit: Payer: Self-pay

## 2021-02-03 VITALS — BP 127/71 | HR 66 | Temp 98.0°F | Resp 18

## 2021-02-03 DIAGNOSIS — I214 Non-ST elevation (NSTEMI) myocardial infarction: Secondary | ICD-10-CM | POA: Diagnosis not present

## 2021-02-03 DIAGNOSIS — Z955 Presence of coronary angioplasty implant and graft: Secondary | ICD-10-CM

## 2021-02-03 DIAGNOSIS — N183 Chronic kidney disease, stage 3 unspecified: Secondary | ICD-10-CM | POA: Diagnosis not present

## 2021-02-03 DIAGNOSIS — D649 Anemia, unspecified: Secondary | ICD-10-CM

## 2021-02-03 MED ORDER — SODIUM CHLORIDE 0.9 % IV SOLN
Freq: Once | INTRAVENOUS | Status: AC
  Filled 2021-02-03: qty 250

## 2021-02-03 MED ORDER — IRON SUCROSE 20 MG/ML IV SOLN
200.0000 mg | Freq: Once | INTRAVENOUS | Status: AC
  Administered 2021-02-03: 10:00:00 200 mg via INTRAVENOUS

## 2021-02-03 MED ORDER — SODIUM CHLORIDE 0.9 % IV SOLN: 200.0000 mg | Freq: Once | INTRAVENOUS | Status: DC

## 2021-02-03 NOTE — Progress Notes (Signed)
Daily Session Note  Patient Details  Name: Sean Reyes. MRN: [id number] Date of Birth: [birth year] Referring Provider:   Flowsheet Row Cardiac Rehab from 01/19/2021 in [facility] Cardiac and Pulmonary Rehab  Referring Provider [name] MD       Encounter Date: 02/03/2021  Check In:  Session Check In - 02/03/21 1357       Check-In   Supervising physician immediately available to respond to emergencies See telemetry face sheet for immediately available ER MD    Location [facility]-Cardiac & Pulmonary Rehab    Staff Present Renita Papa, RN BSN;Joseph [place], RCP,RRT,BSRT;Jessica [place], [place], RCEP, CCRP, CCET    Virtual Visit No    Medication changes reported     No    Fall or balance concerns reported    No    Warm-up and Cool-down Performed on first and last piece of equipment    Resistance Training Performed Yes    VAD Patient? No    PAD/SET Patient? No      Pain Assessment   Currently in Pain? No/denies                Social History   Tobacco Use  Smoking Status Never  Smokeless Tobacco Never    Goals Met:  Independence with exercise equipment Exercise tolerated well No report of concerns or symptoms today Strength training completed today  Goals Unmet:  Not Applicable  Comments: Pt able to follow exercise prescription today without complaint.  Will continue to monitor for progression.    Dr. Emily Filbert is Medical Director for [place].  Dr. Ottie Glazier is Medical Director for [facility] Pulmonary Rehabilitation.

## 2021-02-08 ENCOUNTER — Telehealth: Payer: Medicare Other

## 2021-02-08 ENCOUNTER — Ambulatory Visit (INDEPENDENT_AMBULATORY_CARE_PROVIDER_SITE_OTHER): Payer: Medicare Other

## 2021-02-08 DIAGNOSIS — E1143 Type 2 diabetes mellitus with diabetic autonomic (poly)neuropathy: Secondary | ICD-10-CM

## 2021-02-08 DIAGNOSIS — I152 Hypertension secondary to endocrine disorders: Secondary | ICD-10-CM

## 2021-02-08 DIAGNOSIS — I480 Paroxysmal atrial fibrillation: Secondary | ICD-10-CM

## 2021-02-08 DIAGNOSIS — G894 Chronic pain syndrome: Secondary | ICD-10-CM

## 2021-02-08 DIAGNOSIS — E1169 Type 2 diabetes mellitus with other specified complication: Secondary | ICD-10-CM

## 2021-02-08 DIAGNOSIS — M79661 Pain in right lower leg: Secondary | ICD-10-CM

## 2021-02-08 DIAGNOSIS — E1159 Type 2 diabetes mellitus with other circulatory complications: Secondary | ICD-10-CM

## 2021-02-08 NOTE — Patient Instructions (Signed)
Visit Information  Thank you for taking time to visit with me today. Please don't hesitate to contact me if I can be of assistance to you before our next scheduled telephone appointment.  Following are the goals we discussed today:  RNCM Clinical Goal(s):  Patient will verbalize understanding of plan for management of HTN, HLD, AFIB, DMII, and Chronic pain as evidenced by compliance with plan of care, calling the office for changes, and working with the CCM team to effectively manage health and well being  take all medications exactly as prescribed and will call provider for medication related questions as evidenced by compliance with medications and calling for refills before running out    attend all scheduled medical appointments: 04-04-2021 at 11 am as evidenced by keeping appointments and calling for rescheduling needs         demonstrate improved and ongoing health management independence as evidenced by working with the CCM team to effectively manage health and well being         demonstrate a decrease in HTN, HLD, AFIB, DMII, and chronic pain  exacerbations  as evidenced by stable conditions, no acute changes, and working with the CCM team to effectively manage health and well being  demonstrate ongoing self health care management ability for effective management of chronic condtions  as evidenced by working with the CCM team  through collaboration with Consulting civil engineer, provider, and care team.    Interventions: 1:1 collaboration with primary care provider regarding development and update of comprehensive plan of care as evidenced by provider attestation and co-signature Inter-disciplinary care team collaboration (see longitudinal plan of care) Evaluation of current treatment plan related to  self management and patient's adherence to plan as established by provider     SDOH Barriers (Status: Goal on Track (progressing): YES.) Long Term Goal  Patient interviewed and SDOH assessment  performed        Patient interviewed and appropriate assessments performed Provided patient with information about resources available in the community and care guides to assist with changes in SDOH, or new needs  Discussed plans with patient for ongoing care management follow up and provided patient with direct contact information for care management team Advised patient to call the office for changes in [place], questions, or concerns       Diabetes:  (Status: Goal on Track (progressing): YES.) Long Term Goal         Lab Results  Component Value Date    HGBA1C 6.6 (H) 12/21/2020  Assessed patient's understanding of A1c goal: <7% Provided education to patient about basic DM disease process; Reviewed medications with patient and discussed importance of medication adherence. 02-08-2021: The patient states compliance with medications        Reviewed prescribed diet with patient Heart Healthy/ADA diet. 02-08-2021: The patient is compliant with heart healthy/ADA diet- reviewed Counseled on importance of regular laboratory monitoring as prescribed. 12-27-  2022: The patient has regular lab work. Review of November labs.       Discussed plans with patient for ongoing care management follow up and provided patient with direct contact information for care management team;      Provided patient with written educational materials related to hypo and hyperglycemia and importance of correct treatment. 02-08-2021: Knows how to handle high and low blood sugars        Reviewed scheduled/upcoming provider appointments including: 04-04-2021 at 11 am        Advised patient, providing education and rationale, to check  cbg as directed  and record        call provider for findings outside established parameters;       Review of patient status, including review of consultants reports, relevant laboratory and other test results, and medications completed;         Hyperlipidemia:  (Status: Goal on Track  (progressing): YES.) Long Term Goal       Lab Results  Component Value Date    CHOL 94 12/21/2020    HDL 36 (L) 12/21/2020    LDLCALC 45 12/21/2020    TRIG 67 12/21/2020    CHOLHDL 2.6 12/21/2020      Medication review performed; medication list updated in electronic medical record.  Provider established cholesterol goals reviewed 02-08-2021: Reviewed Counseled on importance of regular laboratory monitoring as prescribed; Provided HLD educational materials; Reviewed role and benefits of statin for ASCVD risk reduction; Discussed strategies to manage statin-induced myalgias; Reviewed importance of limiting foods high in cholesterol; Reviewed exercise goals and target of 150 minutes per week; Screening for signs and symptoms of depression related to chronic disease state;  Assessed social determinant of health barriers;    Hypertension: (Status: Goal on Track (progressing): YES.) Last practice recorded BP readings:     BP Readings from Last 3 Encounters:  02/03/21 127/71  01/28/21 132/68  01/19/21 (!) 151/82  Most recent eGFR/CrCl:       Lab Results  Component Value Date    EGFR 57 (L) 12/31/2020    No components found for: CRCL   Evaluation of current treatment plan related to hypertension self management and patient's adherence to plan as established by provider.  02-08-2021: The patient is concerned that his blood pressure is elevated first thing in the am around 170/90's in the am and after he sits down it will drop to 101/60. Denies pain or being anxious first thing in the am. Education on the goal of systolic <811 and diastolic <91. Also review of medications and if he is taking them correctly. The patient states compliance with bp medications. The patient may need to follow up with the cardiologist as he ran out of his medications for his AFIB and he can tell his heart is out of rhythm. Education and support given. See AFIB plan of care for more details.  Provided education  to patient re: stroke prevention, s/s of heart attack and stroke. 02-08-2021: Review of elevations in blood pressure putting the patient at higher risk of heart attack and stroke; Reviewed prescribed diet heart healthy/ADA diet. 02-08-2021: The patient states that he is compliant.  Reviewed medications with patient and discussed importance of compliance. 02-08-2021: States compliance with blood pressure medications.   Discussed plans with patient for ongoing care management follow up and provided patient with direct contact information for care management team; Advised patient, providing education and rationale, to monitor blood pressure daily and record, calling PCP for findings outside established parameters;  Advised patient to discuss recent hospitalization  with provider; Provided education on prescribed diet heart healthy diet ;  Discussed complications of poorly controlled blood pressure such as heart disease, stroke, circulatory complications, vision complications, kidney impairment, sexual dysfunction;  Evaluation of post discharge from the hospital due to a NSTEMI. The patient was hospitalized from 12-20-2020 to 12-23-2020. The patient underwent heart catheterization and had a stent placed. The patient states that he is doing well. The patient will follow up with the PA for cardiology today. The patient is concerned as he had went to  the doctor about the chest pain he was having. Explained the risk for increased incidence of heart attack and stroke with the multiple chronic conditions the patient has. Also discussed how lifestyle choices, hereditary factors and other things may contribute to higher risk. The patient is concerned over his children. Education and support given.      A-fib:  (Status: Goal on Track (progressing): YES.) Long Term Goal added 02-08-2021- had been stable. Patient ran out of medications, daughter has gone to pick up refill order now Counseled on increased risk of stroke  due to Afib and benefits of anticoagulation for stroke prevention           Reviewed importance of adherence to anticoagulant exactly as prescribed Advised patient to discuss recently running out of medications x 3 to 4 days and the patient heart being out of rhythm  with provider Counseled on bleeding risk associated with AFIB and importance of self-monitoring for signs/symptoms of bleeding Counseled on avoidance of NSAIDs due to increased bleeding risk with anticoagulants Counseled on importance of regular laboratory monitoring as prescribed Counseled on seeking medical attention after a head injury or if there is blood in the urine/stool Afib action plan reviewed. 02-08-2021: The patient was not feeling well today and states that he messed up and ran out of his medications for his AFIB. The patient states he has been out for 3 to 4 days. The patient states that his daughter has gone to pick up his refill now. The patient takes Rythmol 425 mg BID. Education given to reach out to the cardiologist for recommendations and treatment options. The patient states he hopes he feels better once starting the Rythmol back. He is taking cardiac rehab and advised the patient to discuss this with the rehab personnel as well. The patient does not want to go back to the hospital. The patient educated on what worse looks like and to seek emergent care for changes in heart health. Education on making sure he takes his medications exactly as prescribed. Will continue to monitor for changes.    Pain:  (Status: Goal on Track (progressing): YES.) Long Term Goal  Pain assessment performed. 12-28-2020: The patient states that he is still having the right shin pain and it is very debilitating when it occurs. He plans to talk to pcp on upcoming visit. He currently rates it at a 10 when it occurs. His back pain is at a good level today. Rates at a 3/4 today on a scale of 0-10. States the stimulator seems to be on the right  setting. 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 The patient denies any falls and is using his crutch for better stability. Empathetic listening and support given. He discuss his concerns with pcp at upcoming visit on 01-10-2021 at 340 pm. 02-08-2021: The patient states that he was sitting down at the time of the call and he has no pain in his right leg or back at this time. He does have pain at a 5 or 6 in his back when he is walking and sometimes up to a 10 in his right shin. He states he did have to change the settings on his stimulator yesterday and he doesn't know if it is because of the neuropathy in his back or what. He just overall does not feel well. Education given.  Medications reviewed. 02-08-2021: Is compliant with his medications and has the pain stimulator as well. Reviewed provider  established plan for pain management; Discussed importance of adherence to all scheduled medical appointments. Is compliant with appointments. Is considering going to the dermatologist to determine if they can help with the right shin pain Counseled on the importance of reporting any/all new or changed pain symptoms or management strategies to pain management provider; Advised patient to report to care team affect of pain on daily activities; Discussed use of relaxation techniques and/or diversional activities to assist with pain reduction (distraction, imagery, relaxation, massage, acupressure, TENS, heat, and cold application; Reviewed with patient prescribed pharmacological and nonpharmacological pain relief strategies; Advised patient to discuss ongoing right shin pain that is getting worse with provider;     Patient Goals/Self-Care Activities: Patient will self administer medications as prescribed as evidenced by self report/primary caregiver report  Patient will attend all scheduled provider appointments as evidenced by clinician review of documented  attendance to scheduled appointments and patient/caregiver report Patient will call pharmacy for medication refills as evidenced by patient report and review of pharmacy fill history as appropriate Patient will attend church or other social activities as evidenced by patient report Patient will continue to perform ADL's independently as evidenced by patient/caregiver report Patient will continue to perform IADL's independently as evidenced by patient/caregiver report Patient will call provider office for new concerns or questions as evidenced by review of documented incoming telephone call notes and patient report Patient will work with BSW to address care coordination needs and will continue to work with the clinical team to address health care and disease management related needs as evidenced by documented adherence to scheduled care management/care coordination appointments schedule appointment with eye doctor check blood sugar at prescribed times: when you have symptoms of low or high blood sugar, before and after exercise, and as directed by provider   check feet daily for cuts, sores or redness enter blood sugar readings and medication or insulin into daily log take the blood sugar log to all doctor visits trim toenails straight across drink 6 to 8 glasses of water each day eat fish at least once per week fill half of plate with vegetables limit fast food meals to no more than 1 per week manage portion size prepare main meal at home 3 to 5 days each week read food labels for fat, fiber, carbohydrates and portion size reduce red meat to 2 to 3 times a week do heel pump exercise 2 to 3 times each day keep feet up while sitting wash and dry feet carefully every day wear comfortable, cotton socks wear comfortable, well-fitting shoes - check blood pressure 3 times per week - choose a place to take my blood pressure (home, clinic or office, retail store) - write blood pressure results in  a log or diary - learn about high blood pressure - keep a blood pressure log - take blood pressure log to all doctor appointments - call doctor for signs and symptoms of high blood pressure - develop an action plan for high blood pressure - keep all doctor appointments - take medications for blood pressure exactly as prescribed - report new symptoms to your doctor - eat more whole grains, fruits and vegetables, lean meats and healthy fats - call for medicine refill 2 or 3 days before it runs out - take all medications exactly as prescribed - call doctor with any symptoms you believe are related to your medicine - call doctor when you experience any new symptoms - go to all doctor appointments as scheduled - adhere to  prescribed diet: Heart healthy/ADA          Our next appointment is by telephone on 04-19-2021  at 0945 am  Please call the care guide team at [phone] if you need to cancel or reschedule your appointment.   If you are experiencing a Mental Health or [place] or need someone to talk to, please call the Suicide and Crisis Lifeline: 988 call the [place] National Suicide Prevention Lifeline: 629 824 3086 or TTY: 7180688516 TTY 757-813-9513) to talk to a trained counselor call 1-800-273-TALK (toll free, 24 hour hotline)   Patient verbalizes understanding of instructions provided today and agrees to view in [place].   Noreene Larsson RN, MSN, [facility] Mobile: [phone]

## 2021-02-08 NOTE — Chronic Care Management (AMB) (Signed)
Chronic Care Management   CCM RN Visit Note  02/08/2021 Name: Sean Reyes. MRN: [id number] DOB: 1941-02-09  Subjective: Sean Roskos. is a 80 y.o. year old male who is a primary care patient of [name], [name], NP. The care management team was consulted for assistance with disease management and care coordination needs.    Engaged with patient by telephone for follow up visit in response to provider referral for case management and/or care coordination services.   Consent to Services:  The patient was given information about Chronic Care Management services, agreed to services, and gave verbal consent prior to initiation of services.  Please see initial visit note for detailed documentation.   Patient agreed to services and verbal consent obtained.   Assessment: Review of patient past medical history, allergies, medications, health status, including review of consultants reports, laboratory and other test data, was performed as part of comprehensive evaluation and provision of chronic care management services.   SDOH (Social Determinants of Health) assessments and interventions performed:    CCM Care Plan  Allergies  Allergen Reactions   Levaquin [Levofloxacin In D5w] Anaphylaxis and Shortness Of Breath   Shellfish Allergy Anaphylaxis    Has used duraprep, betadine and ioban in previous surgeries in 2019 and 2018 without issue   Amiodarone Other (See Comments)    Tremors and thyroid toxicity   Adhesive [Tape] Other (See Comments)    Little red bumps under the dressing.  He questions whether is latex related    Outpatient Encounter Medications as of 02/08/2021  Medication Sig   benazepril (LOTENSIN) 10 MG tablet Take 10 mg by mouth daily. Patient states that he takes this PRN   metoprolol succinate (TOPROL-XL) 25 MG 24 hr tablet Take 1 tablet by mouth daily.   propafenone (RYTHMOL SR) 425 MG 12 hr capsule Take 425 mg by mouth 2 (two) times daily. Has been out for 3 days    acetaminophen (TYLENOL) 500 MG tablet Take 500 mg by mouth every 6 (six) hours as needed (pain).   ELIQUIS 5 MG TABS tablet Take 1 tablet (5 mg total) by mouth 2 (two) times daily.   ferrous sulfate 325 (65 FE) MG tablet Take by mouth.   finasteride (PROSCAR) 5 MG tablet Take 1 tablet (5 mg total) by mouth daily.   gabapentin (NEURONTIN) 300 MG capsule Take 1 capsule (300 mg total) by mouth 2 (two) times daily.   isosorbide mononitrate (IMDUR) 30 MG 24 hr tablet Take 30 mg by mouth daily.   metFORMIN (GLUCOPHAGE) 500 MG tablet Take by mouth 2 (two) times daily with a meal.   omeprazole (PRILOSEC) 10 MG capsule Take 10 mg by mouth daily.   pantoprazole (PROTONIX) 40 MG tablet Take by mouth. (Patient not taking: Reported on 01/05/2021)   rosuvastatin (CRESTOR) 5 MG tablet Take 1 tablet (5 mg total) by mouth daily.   tamsulosin (FLOMAX) 0.4 MG CAPS capsule Take 0.8 mg by mouth daily.   ticagrelor (BRILINTA) 90 MG TABS tablet Take 1 tablet (90 mg total) by mouth 2 (two) times daily.   trimethoprim-polymyxin b (POLYTRIM) ophthalmic solution Apply to eye. (Patient not taking: Reported on 01/05/2021)   No facility-administered encounter medications on file as of 02/08/2021.    Patient Active Problem List   Diagnosis Date Noted   Coronary artery disease 12/28/2020   History of non-ST elevation myocardial infarction (NSTEMI) 12/21/2020   C. 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   Postlaminectomy syndrome, lumbar region 01/16/2019   History of fusion of lumbar spine  (L2-L5) 01/16/2019   Chronic radicular lumbar pain 01/16/2019   HNP (herniated nucleus pulposus), lumbar 04/29/2018   Advanced care planning/counseling discussion 09/28/2016   Spinal stenosis, lumbar region, 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/13/2016   Hyperlipidemia associated with type 2 diabetes mellitus ([place]) 07/14/2015   Symptomatic anemia 06/30/2015   Benign prostatic hyperplasia without lower urinary tract symptoms 06/02/2015   OSA (obstructive sleep apnea) 03/23/2015   Hypertension associated with diabetes ([place]) 09/28/2014   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[place]) 09/28/2014   H/O prior ablation treatment 10/19/2011   AF (paroxysmal atrial fibrillation) ([place]) 10/19/2011    Conditions to be addressed/monitored:Atrial Fibrillation, HTN, HLD, DMII, and Chronic pain   Care Plan : RNCM: General Plan of Care (Adult) for Chronic Disease Management and Care Coordination Needs  Updates made by Vanita Ingles, RN since 02/08/2021 12:00 AM     Problem: RNCM: Development of Plan of Care for Chronic Disease Management (DM, HTN, HLD, AFIB,Chronic Pain, Recent HA)   Priority: High     Long-Range Goal: RNCM: Effective Management  of Plan of Care for Chronic Disease Management (DM, HTN, HLD, AFIB,  Chronic Pain, Recent HA)   Start Date: 12/28/2020  Expected End Date: 12/28/2021  Priority: High  Note:   Current Barriers:  Knowledge Deficits related to plan of care for management of HTN, HLD, AFIB, DMII, and Chronic pain   Chronic Disease Management support and education needs related to HTN, HLD, AFIB, DMII, and Chronic pain   RNCM Clinical Goal(s):  Patient will verbalize understanding of plan for management of HTN, HLD, AFIB, DMII, and Chronic pain as evidenced by compliance with plan of care, calling the office for changes, and working with the CCM team to effectively manage health and well being  take all medications exactly as prescribed and will call provider for medication  related questions as evidenced by compliance with medications and calling for refills before running out    attend all scheduled medical appointments: 04-04-2021 at 11 am as evidenced by keeping appointments and calling for rescheduling needs         demonstrate improved and ongoing health management independence as evidenced by working with the CCM team to effectively manage health and well being         demonstrate a decrease in HTN, HLD, AFIB, DMII, and chronic pain  exacerbations  as evidenced by stable conditions, no acute changes, and working with the CCM team to effectively manage health and well being  demonstrate ongoing self health care management ability for effective management of chronic condtions  as evidenced by working with the CCM team  through collaboration with Consulting civil engineer, provider, and care team.   Interventions: 1:1 collaboration with primary care provider regarding development and update of comprehensive plan of care as evidenced by provider attestation and co-signature Inter-disciplinary care team collaboration (see longitudinal plan of care) Evaluation of current treatment plan related to  self management and patient's adherence to plan as established by provider   SDOH Barriers (Status: Goal on Track (progressing):  YES.) Long Term Goal  Patient interviewed and SDOH assessment performed        Patient interviewed and appropriate assessments performed Provided patient with information about resources available in the community and care guides to assist with changes in SDOH, or new needs  Discussed plans with patient for ongoing care management follow up and provided patient with direct contact information for care management team Advised patient to call the office for changes in SDOH, questions, or concerns    Diabetes:  (Status: Goal on Track (progressing): YES.) Long Term Goal   Lab Results  Component Value Date   HGBA1C 6.6 (H) 12/21/2020  Assessed patient's  understanding of A1c goal: <7% Provided education to patient about basic DM disease process; Reviewed medications with patient and discussed importance of medication adherence. 02-08-2021: The patient states compliance with medications        Reviewed prescribed diet with patient Heart Healthy/ADA diet. 02-08-2021: The patient is compliant with heart healthy/ADA diet- reviewed Counseled on importance of regular laboratory monitoring as prescribed. 12-27-  2022: The patient has regular lab work. Review of November labs.       Discussed plans with patient for ongoing care management follow up and provided patient with direct contact information for care management team;      Provided patient with written educational materials related to hypo and hyperglycemia and importance of correct treatment. 02-08-2021: Knows how to handle high and low blood sugars        Reviewed scheduled/upcoming provider appointments including: 04-04-2021 at 11 am        Advised patient, providing education and rationale, to check cbg as directed  and record        call provider for findings outside established parameters;       Review of patient status, including review of consultants reports, relevant laboratory and other test results, and medications completed;        Hyperlipidemia:  (Status: Goal on Track (progressing): YES.) Long Term Goal  Lab Results  Component Value Date   CHOL 94 12/21/2020   HDL 36 (L) 12/21/2020   LDLCALC 45 12/21/2020   TRIG 67 12/21/2020   CHOLHDL 2.6 12/21/2020     Medication review performed; medication list updated in electronic medical record.  Provider established cholesterol goals reviewed 02-08-2021: Reviewed Counseled on importance of regular laboratory monitoring as prescribed; Provided HLD educational materials; Reviewed role and benefits of statin for ASCVD risk reduction; Discussed strategies to manage statin-induced myalgias; Reviewed importance of limiting foods high in  cholesterol; Reviewed exercise goals and target of 150 minutes per week; Screening for signs and symptoms of depression related to chronic disease state;  Assessed social determinant of health barriers;   Hypertension: (Status: Goal on Track (progressing): YES.) Last practice recorded BP readings:  BP Readings from Last 3 Encounters:  02/03/21 127/71  01/28/21 132/68  01/19/21 (!) 151/82  Most recent eGFR/CrCl:  Lab Results  Component Value Date   EGFR 57 (L) 12/31/2020    No components found for: CRCL  Evaluation of current treatment plan related to hypertension self management and patient's adherence to plan as established by provider.  02-08-2021: The patient is concerned that his blood pressure is elevated first thing in the am around 170/90's in the am and after he sits down it will drop to 101/60. Denies pain or being anxious first thing in the am. Education on the goal of systolic <737 and diastolic <10. Also review of medications and if he is  taking them correctly. The patient states compliance with bp medications. The patient may need to follow up with the cardiologist as he ran out of his medications for his AFIB and he can tell his heart is out of rhythm. Education and support given. See AFIB plan of care for more details.  Provided education to patient re: stroke prevention, s/s of heart attack and stroke. 02-08-2021: Review of elevations in blood pressure putting the patient at higher risk of heart attack and stroke; Reviewed prescribed diet heart healthy/ADA diet. 02-08-2021: The patient states that he is compliant.  Reviewed medications with patient and discussed importance of compliance. 02-08-2021: States compliance with blood pressure medications.   Discussed plans with patient for ongoing care management follow up and provided patient with direct contact information for care management team; Advised patient, providing education and rationale, to monitor blood pressure daily  and record, calling PCP for findings outside established parameters;  Advised patient to discuss recent hospitalization  with provider; Provided education on prescribed diet heart healthy diet ;  Discussed complications of poorly controlled blood pressure such as heart disease, stroke, circulatory complications, vision complications, kidney impairment, sexual dysfunction;  Evaluation of post discharge from the hospital due to a NSTEMI. The patient was hospitalized from 12-20-2020 to 12-23-2020. The patient underwent heart catheterization and had a stent placed. The patient states that he is doing well. The patient will follow up with the PA for cardiology today. The patient is concerned as he had went to the doctor about the chest pain he was having. Explained the risk for increased incidence of heart attack and stroke with the multiple chronic conditions the patient has. Also discussed how lifestyle choices, hereditary factors and other things may contribute to higher risk. The patient is concerned over his children. Education and support given.    A-fib:  (Status: Goal on Track (progressing): YES.) Long Term Goal added 02-08-2021- had been stable. Patient ran out of medications, daughter has gone to pick up refill order now Counseled on increased risk of stroke due to Afib and benefits of anticoagulation for stroke prevention           Reviewed importance of adherence to anticoagulant exactly as prescribed Advised patient to discuss recently running out of medications x 3 to 4 days and the patient heart being out of rhythm  with provider Counseled on bleeding risk associated with AFIB and importance of self-monitoring for signs/symptoms of bleeding Counseled on avoidance of NSAIDs due to increased bleeding risk with anticoagulants Counseled on importance of regular laboratory monitoring as prescribed Counseled on seeking medical attention after a head injury or if there is blood in the  urine/stool Afib action plan reviewed. 02-08-2021: The patient was not feeling well today and states that he messed up and ran out of his medications for his AFIB. The patient states he has been out for 3 to 4 days. The patient states that his daughter has gone to pick up his refill now. The patient takes Rythmol 425 mg BID. Education given to reach out to the cardiologist for recommendations and treatment options. The patient states he hopes he feels better once starting the Rythmol back. He is taking cardiac rehab and advised the patient to discuss this with the rehab personnel as well. The patient does not want to go back to the hospital. The patient educated on what worse looks like and to seek emergent care for changes in heart health. Education on making sure he takes his medications exactly  as prescribed. Will continue to monitor for changes.   Pain:  (Status: Goal on Track (progressing): YES.) Long Term Goal  Pain assessment performed. 12-28-2020: The patient states that he is still having the right shin pain and it is very debilitating when it occurs. He plans to talk to pcp on upcoming visit. He currently rates it at a 10 when it occurs. His back pain is at a good level today. Rates at a 3/4 today on a scale of 0-10. States the stimulator seems to be on the right setting. 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 The patient denies any falls and is using his crutch for better stability. Empathetic listening and support given. He discuss his concerns with pcp at upcoming visit on 01-10-2021 at 340 pm. 02-08-2021: The patient states that he was sitting down at the time of the call and he has no pain in his right leg or back at this time. He does have pain at a 5 or 6 in his back when he is walking and sometimes up to a 10 in his right shin. He states he did have to change the settings on his stimulator yesterday and he doesn't know if it is because of the  neuropathy in his back or what. He just overall does not feel well. Education given.  Medications reviewed. 02-08-2021: Is compliant with his medications and has the pain stimulator as well. Reviewed provider established plan for pain management; Discussed importance of adherence to all scheduled medical appointments. Is compliant with appointments. Is considering going to the dermatologist to determine if they can help with the right shin pain Counseled on the importance of reporting any/all new or changed pain symptoms or management strategies to pain management provider; Advised patient to report to care team affect of pain on daily activities; Discussed use of relaxation techniques and/or diversional activities to assist with pain reduction (distraction, imagery, relaxation, massage, acupressure, TENS, heat, and cold application; Reviewed with patient prescribed pharmacological and nonpharmacological pain relief strategies; Advised patient to discuss ongoing right shin pain that is getting worse with provider;   Patient Goals/Self-Care Activities: Patient will self administer medications as prescribed as evidenced by self report/primary caregiver report  Patient will attend all scheduled provider appointments as evidenced by clinician review of documented attendance to scheduled appointments and patient/caregiver report Patient will call pharmacy for medication refills as evidenced by patient report and review of pharmacy fill history as appropriate Patient will attend church or other social activities as evidenced by patient report Patient will continue to perform ADL's independently as evidenced by patient/caregiver report Patient will continue to perform IADL's independently as evidenced by patient/caregiver report Patient will call provider office for new concerns or questions as evidenced by review of documented incoming telephone call notes and patient report Patient will work with BSW  to address care coordination needs and will continue to work with the clinical team to address health care and disease management related needs as evidenced by documented adherence to scheduled care management/care coordination appointments schedule appointment with eye doctor check blood sugar at prescribed times: when you have symptoms of low or high blood sugar, before and after exercise, and as directed by provider   check feet daily for cuts, sores or redness enter blood sugar readings and medication or insulin into daily log take the blood sugar log to all doctor visits trim toenails straight across drink 6 to 8 glasses of water  each day eat fish at least once per week fill half of plate with vegetables limit fast food meals to no more than 1 per week manage portion size prepare main meal at home 3 to 5 days each week read food labels for fat, fiber, carbohydrates and portion size reduce red meat to 2 to 3 times a week do heel pump exercise 2 to 3 times each day keep feet up while sitting wash and dry feet carefully every day wear comfortable, cotton socks wear comfortable, well-fitting shoes - check blood pressure 3 times per week - choose a place to take my blood pressure (home, clinic or office, retail store) - write blood pressure results in a log or diary - learn about high blood pressure - keep a blood pressure log - take blood pressure log to all doctor appointments - call doctor for signs and symptoms of high blood pressure - develop an action plan for high blood pressure - keep all doctor appointments - take medications for blood pressure exactly as prescribed - report new symptoms to your doctor - eat more whole grains, fruits and vegetables, lean meats and healthy fats - call for medicine refill 2 or 3 days before it runs out - take all medications exactly as prescribed - call doctor with any symptoms you believe are related to your medicine - call doctor when you  experience any new symptoms - go to all doctor appointments as scheduled - adhere to prescribed diet: Heart healthy/ADA       Plan:Telephone follow up appointment with care management team member scheduled for:  04-19-2021 at [place] am  Noreene Larsson RN, MSN, [facility] Mobile: [phone]

## 2021-02-09 ENCOUNTER — Encounter: Payer: Medicare Other | Admitting: *Deleted

## 2021-02-09 ENCOUNTER — Other Ambulatory Visit: Payer: Self-pay

## 2021-02-09 ENCOUNTER — Encounter: Payer: Self-pay | Admitting: *Deleted

## 2021-02-09 DIAGNOSIS — I214 Non-ST elevation (NSTEMI) myocardial infarction: Secondary | ICD-10-CM | POA: Diagnosis not present

## 2021-02-09 DIAGNOSIS — Z955 Presence of coronary angioplasty implant and graft: Secondary | ICD-10-CM

## 2021-02-09 NOTE — Progress Notes (Signed)
Cardiac Individual Treatment Plan  Patient Details  Name: Sean Reyes. MRN: [id number] Date of Birth: [birth year] Referring Provider:   Flowsheet Row Cardiac Rehab from 01/19/2021 in [facility] Cardiac and Pulmonary Rehab  Referring Provider Sean Cowman MD       Initial Encounter Date:  Flowsheet Row Cardiac Rehab from 01/19/2021 in [facility] Cardiac and Pulmonary Rehab  Date 01/19/21       Visit Diagnosis: NSTEMI (non-ST elevated myocardial infarction) [facility])  Status post coronary artery stent placement  Patient's Home Medications on Admission:  Current Outpatient Medications:    acetaminophen (TYLENOL) 500 MG tablet, Take 500 mg by mouth every 6 (six) hours as needed (pain)., Disp: , Rfl:    benazepril (LOTENSIN) 10 MG tablet, Take 10 mg by mouth daily. Patient states that he takes this PRN, Disp: , Rfl:    ELIQUIS 5 MG TABS tablet, Take 1 tablet (5 mg total) by mouth 2 (two) times daily., Disp: 180 tablet, Rfl: 4   ferrous sulfate 325 (65 FE) MG tablet, Take by mouth., Disp: , Rfl:    finasteride (PROSCAR) 5 MG tablet, Take 1 tablet (5 mg total) by mouth daily., Disp: 90 tablet, Rfl: 2   gabapentin (NEURONTIN) 300 MG capsule, Take 1 capsule (300 mg total) by mouth 2 (two) times daily., Disp: 180 capsule, Rfl: 4   isosorbide mononitrate (IMDUR) 30 MG 24 hr tablet, Take 30 mg by mouth daily., Disp: , Rfl:    metFORMIN (GLUCOPHAGE) 500 MG tablet, Take by mouth 2 (two) times daily with a meal., Disp: , Rfl:    metoprolol succinate (TOPROL-XL) 25 MG 24 hr tablet, Take 1 tablet by mouth daily., Disp: , Rfl:    omeprazole (PRILOSEC) 10 MG capsule, Take 10 mg by mouth daily., Disp: , Rfl:    pantoprazole (PROTONIX) 40 MG tablet, Take by mouth. (Patient not taking: Reported on 01/05/2021), Disp: , Rfl:    propafenone (RYTHMOL SR) 425 MG 12 hr capsule, Take 425 mg by mouth 2 (two) times daily. Has been out for 3 days, Disp: , Rfl:    rosuvastatin (CRESTOR) 5 MG tablet, Take 1 tablet (5 mg  total) by mouth daily., Disp: 90 tablet, Rfl: 2   tamsulosin (FLOMAX) 0.4 MG CAPS capsule, Take 0.8 mg by mouth daily., Disp: , Rfl:    ticagrelor (BRILINTA) 90 MG TABS tablet, Take 1 tablet (90 mg total) by mouth 2 (two) times daily., Disp: 60 tablet, Rfl: 0   trimethoprim-polymyxin b (POLYTRIM) ophthalmic solution, Apply to eye. 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   Hyperlipidemia    Hypertension    Nocturia    S/P ablation of atrial fibrillation    Ablative therapy   Sleep apnea    CPAP    Spinal stenosis    Tachycardia,  unspecified     Tobacco Use: Social History   Tobacco Use  Smoking Status Never  Smokeless Tobacco Never    Labs: Recent Review Flowsheet Data     Labs for ITP Cardiac and Pulmonary Rehab Latest Ref Rng & Units 02/26/2020 06/10/2020 09/09/2020 12/10/2020 12/21/2020   Cholestrol 0 - 200 mg/dL 120 - 105 - 94   LDLCALC 0 - 99 mg/dL 58 - 43 - 45   HDL >40 mg/dL 43 - 41 - 36(L)   Trlycerides <150 mg/dL 103 - 113 - 67   Hemoglobin A1c 4.8 - 5.6 % 6.6 6.7 6.6 6.5(H) 6.6(H)        Exercise Target Goals: Exercise Program Goal: Individual exercise prescription set using results from initial 6 min walk test and THRR while considering  patients activity barriers and safety.   Exercise Prescription Goal: Initial exercise prescription builds to 30-45 minutes a day of aerobic activity, 2-3 days per week.  Home exercise  guidelines will be given to patient during program as part of exercise prescription that the participant will acknowledge.   Education: Aerobic Exercise: - Group verbal and visual presentation on the components of exercise prescription. Introduces F.I.T.T principle from ACSM for exercise prescriptions.  Reviews F.I.T.T. principles of aerobic exercise including progression. Written material given at graduation.   Education: Resistance Exercise: - Group verbal and visual presentation on the components of exercise prescription. Introduces F.I.T.T principle from ACSM for exercise prescriptions  Reviews F.I.T.T. principles of resistance exercise including progression. Written material given at graduation.    Education: Exercise & Equipment Safety: - Individual verbal instruction and demonstration of equipment use and safety with use of the equipment. Flowsheet Row Cardiac Rehab from 01/19/2021 in [facility] Cardiac and Pulmonary Rehab  Education need identified 01/19/21  Date 01/19/21  Educator [place]  Instruction Review Code 1- Verbalizes Understanding       Education: Exercise Physiology & General Exercise Guidelines: - Group verbal and written instruction with models to review the exercise physiology of the cardiovascular system and associated critical values. Provides general exercise guidelines with specific guidelines to those with heart or lung disease.  Flowsheet Row Cardiac Rehab from 01/19/2021 in [facility] Cardiac and Pulmonary Rehab  Education need identified 01/19/21       Education: Flexibility, Balance, Mind/Body Relaxation: - Group verbal and visual presentation with interactive activity on the components of exercise prescription. Introduces F.I.T.T principle from ACSM for exercise prescriptions. Reviews F.I.T.T. principles of flexibility and balance exercise training including progression. Also discusses the mind body connection.  Reviews various relaxation techniques to help reduce and  manage stress (i.e. Deep breathing, progressive muscle relaxation, and visualization). Balance handout provided to take home. Written material given at graduation.   Activity Barriers & Risk Stratification:  Activity Barriers & Cardiac Risk Stratification - 01/19/21 1245       Activity Barriers & Cardiac Risk Stratification   Activity Barriers Other (comment);Deconditioning;Back Problems;Joint Problems;Left Hip Replacement;Right Hip Replacement;Muscular Weakness;Assistive Device    Comments neuropathy; pain stimulator inserted April 2021 for back; 3 hip replacements, neuropathy in legs    Cardiac Risk Stratification High             6 Minute Walk:  6 Minute Walk     Row Name 01/19/21 1250         6 Minute Walk   Phase Initial     Distance 550 feet     Walk Time 6 minutes     # of Rest Breaks 0  MPH 1.04     METS 1.2     RPE 12     Perceived Dyspnea  1     VO2 Peak 4.21     Symptoms Yes (comment)     Comments Fatigue, Back pain 4/10     Resting HR 58 bpm     Resting BP 138/72     Resting Oxygen Saturation  99 %     Exercise Oxygen Saturation  during 6 min walk 98 %     Max Ex. HR 79 bpm     Max Ex. BP 146/70     2 Minute Post BP 140/72              Oxygen Initial Assessment:   Oxygen Re-Evaluation:   Oxygen Discharge (Final Oxygen Re-Evaluation):   Initial Exercise Prescription:  Initial Exercise Prescription - 01/19/21 1200       Date of Initial Exercise RX and Referring Provider   Date 01/19/21    Referring Provider Paraschos, Alexander MD      Oxygen   Maintain Oxygen Saturation 88% or higher      Treadmill   MPH 1    Grade 0    Minutes 15    METs 1.8      Recumbant Bike   Level 1    RPM 60    Watts 15    Minutes 15    METs 1      NuStep   Level 1    SPM 80    Minutes 15    METs 1      REL-XR   Level 1    Speed 50    Minutes 15    METs 1      Prescription Details   Frequency (times per week) 2    Duration Progress  to 30 minutes of continuous aerobic without signs/symptoms of physical distress      Intensity   THRR 40-80% of Max Heartrate 91-125    Ratings of Perceived Exertion 11-13    Perceived Dyspnea 0-4      Progression   Progression Continue to progress workloads to maintain intensity without signs/symptoms of physical distress.      Resistance Training   Training Prescription Yes    Weight 3 lb    Reps 10-15             Perform Capillary Blood Glucose checks as needed.  Exercise Prescription Changes:   Exercise Prescription Changes     Row Name 01/19/21 1200 01/31/21 1000           Response to Exercise   Blood Pressure (Admit) 138/72 126/64      Blood Pressure (Exercise) 146/70 116/60      Blood Pressure (Exit) 140/72 124/62      Heart Rate (Admit) 58 bpm 51 bpm      Heart Rate (Exercise) 79 bpm 66 bpm      Heart Rate (Exit) 62 bpm 56 bpm      Oxygen Saturation (Admit) 99 % --      Oxygen Saturation (Exercise) 98 % --      Oxygen Saturation (Exit) 98 % --      Rating of Perceived Exertion (Exercise) 12 13      Perceived Dyspnea (Exercise) 1 --      Symptoms Fatigue, back pain 4/10 hip pain      Comments walk test results 2nd full day of exercise      Duration --  Progress to 30 minutes of  aerobic without signs/symptoms of physical distress      Intensity -- THRR unchanged        Progression   Progression -- Continue to progress workloads to maintain intensity without signs/symptoms of physical distress.      Average METs -- 2.31        Resistance Training   Training Prescription -- Yes      Weight -- 3 lb      Reps -- 10-15        Interval Training   Interval Training -- No        Treadmill   MPH -- 1      Grade -- 0      Minutes -- 15      METs -- 1.8        REL-XR   Level -- 1      Minutes -- 15      METs -- 2.7               Exercise Comments:   Exercise Comments     Row Name 01/24/21 1353           Exercise Comments First full day  of exercise!  Patient was oriented to gym and equipment including functions, settings, policies, and procedures.  Patient's individual exercise prescription and treatment plan were reviewed.  All starting workloads were established based on the results of the 6 minute walk test done at initial orientation visit.  The plan for exercise progression was also introduced and progression will be customized based on patient's performance and goals.                Exercise Goals and Review:   Exercise Goals     Row Name 01/19/21 1259             Exercise Goals   Increase Physical Activity Yes       Intervention Provide advice, education, support and counseling about physical activity/exercise needs.;Develop an individualized exercise prescription for aerobic and resistive training based on initial evaluation findings, risk stratification, comorbidities and participant's personal goals.       Expected Outcomes Short Term: Attend rehab on a regular basis to increase amount of physical activity.;Long Term: Add in home exercise to make exercise part of routine and to increase amount of physical activity.;Long Term: Exercising regularly at least 3-5 days a week.       Increase Strength and Stamina Yes       Intervention Provide advice, education, support and counseling about physical activity/exercise needs.;Develop an individualized exercise prescription for aerobic and resistive training based on initial evaluation findings, risk stratification, comorbidities and participant's personal goals.       Expected Outcomes Short Term: Increase workloads from initial exercise prescription for resistance, speed, and METs.;Short Term: Perform resistance training exercises routinely during rehab and add in resistance training at home;Long Term: Improve cardiorespiratory fitness, muscular endurance and strength as measured by increased METs and functional capacity (6MWT)       Able to understand and use rate of  perceived exertion (RPE) scale Yes       Intervention Provide education and explanation on how to use RPE scale       Expected Outcomes Short Term: Able to use RPE daily in rehab to express subjective intensity level;Long Term:  Able to use RPE to guide intensity level when exercising independently       Able to understand and use  Dyspnea scale Yes       Intervention Provide education and explanation on how to use Dyspnea scale       Expected Outcomes Short Term: Able to use Dyspnea scale daily in rehab to express subjective sense of shortness of breath during exertion;Long Term: Able to use Dyspnea scale to guide intensity level when exercising independently       Knowledge and understanding of Target Heart Rate Range (THRR) Yes       Intervention Provide education and explanation of THRR including how the numbers were predicted and where they are located for reference       Expected Outcomes Short Term: Able to state/look up THRR;Long Term: Able to use THRR to govern intensity when exercising independently;Short Term: Able to use daily as guideline for intensity in rehab       Able to check pulse independently Yes       Intervention Provide education and demonstration on how to check pulse in carotid and radial arteries.;Review the importance of being able to check your own pulse for safety during independent exercise       Expected Outcomes Short Term: Able to explain why pulse checking is important during independent exercise;Long Term: Able to check pulse independently and accurately       Understanding of Exercise Prescription Yes       Intervention Provide education, explanation, and written materials on patient's individual exercise prescription       Expected Outcomes Short Term: Able to explain program exercise prescription;Long Term: Able to explain home exercise prescription to exercise independently                Exercise Goals Re-Evaluation :  Exercise Goals Re-Evaluation      [place] Name 01/24/21 1353 01/31/21 1101           Exercise Goal Re-Evaluation   Exercise Goals Review Increase Physical Activity;Able to understand and use rate of perceived exertion (RPE) scale;Knowledge and understanding of Target Heart Rate Range (THRR);Understanding of Exercise Prescription;Able to understand and use Dyspnea scale;Able to check pulse independently;Increase Strength and Stamina Increase Physical Activity;Increase Strength and Stamina      Comments Reviewed RPE and dyspnea scales, THR and program prescription with pt today.  Pt voiced understanding and was given a copy of goals to take home. Tionne is doing well the first couple of sessions that he has been here for rehab. He has followed his exercise prescription thus far and will continue to progress his loads when appropriate. The treadmill is difficult for him as he is limited by his hip pain. Will continue to monitor as he continues to attend rehab.      Expected Outcomes Short: Use RPE daily to regulate intensity. Long: Follow program prescription in THR. Short: Continue current exercise prescription. Long: Increase overall MET level progressively               Discharge Exercise Prescription (Final Exercise Prescription Changes):  Exercise Prescription Changes - 01/31/21 1000       Response to Exercise   Blood Pressure (Admit) 126/64    Blood Pressure (Exercise) 116/60    Blood Pressure (Exit) 124/62    Heart Rate (Admit) 51 bpm    Heart Rate (Exercise) 66 bpm    Heart Rate (Exit) 56 bpm    Rating of Perceived Exertion (Exercise) 13    Symptoms hip pain    Comments 2nd full day of exercise    Duration Progress to 30 minutes  of  aerobic without signs/symptoms of physical distress    Intensity THRR unchanged      Progression   Progression Continue to progress workloads to maintain intensity without signs/symptoms of physical distress.    Average METs 2.31      Resistance Training   Training Prescription Yes     Weight 3 lb    Reps 10-15      Interval Training   Interval Training No      Treadmill   MPH 1    Grade 0    Minutes 15    METs 1.8      REL-XR   Level 1    Minutes 15    METs 2.7             Nutrition:  Target Goals: Understanding of nutrition guidelines, daily intake of sodium '1500mg'$ , cholesterol '200mg'$ , calories 30% from fat and 7% or less from saturated fats, daily to have 5 or more servings of fruits and vegetables.  Education: All About Nutrition: -Group instruction provided by verbal, written material, interactive activities, discussions, models, and posters to present general guidelines for heart healthy nutrition including fat, fiber, MyPlate, the role of sodium in heart healthy nutrition, utilization of the nutrition label, and utilization of this knowledge for meal planning. Follow up email sent as well. Written material given at graduation.   Biometrics:  Pre Biometrics - 01/19/21 1245       Pre Biometrics   Height 5' 11.5" (1.816 m)    Weight 189 lb 11.2 oz (86 kg)    BMI (Calculated) 26.09    Single Leg Stand 2.3 seconds              Nutrition Therapy Plan and Nutrition Goals:  Nutrition Therapy & Goals - 01/31/21 1327       Nutrition Therapy   Diet Heart healthy, low Na, T2DM    Drug/Food Interactions Statins/Certain Fruits    Protein (specify units) 70g    Fiber 30 grams    Whole Grain Foods 3 servings    Saturated Fats 12 max. grams    Fruits and Vegetables 8 servings/day    Sodium 1.5 grams      Personal Nutrition Goals   Nutrition Goal ST: eat salads at least 3x/week (Sargent suggested this would be a good option as he enjoys salads), make sure dressing has <5g saturated fat per serving LT: limit going out to eat <2-3x/week, limit saturated fat <12-16g/day, include 5 fruit/vegetable servings per day    Comments 80 y.o. M admitted to cardiac rehab s/p stent placement also presents with T2DM, HLD, HTN, GERD, CAD, NSTEMI (12/20/20).  Last A1c 6.6%. PYP: 104. Patient reports that he does not cook a lot; his wife needs two hip replacements. He can stand next to the counter, but not to prepare a full meal - he doesn't normally cook either. B: cereal (frosted flakes, raisin bran, frosted mini wheats, cheerios) - 2% milk. Decaf coffee with splenda. S: biscotti cookie with peanut butter L: sandwich (chicken salad on sourdough today), pimento cheese, roast beef, ham, chicken salad on white wheat. May eat one cookie. S: clementines  D: sometimes another sandwich, take out - chicken usually or fish restaurant (potato), sometimes a chicken salad. S: ice cream (a couple of scoops) Drinks: Sports coach, sometimes a sierra mist. He tries to limit his caffiene. He reports feeling that his diet needs some changes and is willing to do what he can while cooking very  little. Discussed with patient that cooking at home can allow for more control over what he is eating - offered to help with meal planning and cooking strategies to avoid fatigue such as sitting down while chopping vegetables. Discussed heart healthy eating and diabetes friendly eating.      Intervention Plan   Intervention Prescribe, educate and counsel regarding individualized specific dietary modifications aiming towards targeted core components such as weight, hypertension, lipid management, diabetes, heart failure and other comorbidities.    Expected Outcomes Short Term Goal: Understand basic principles of dietary content, such as calories, fat, sodium, cholesterol and nutrients.;Short Term Goal: A plan has been developed with personal nutrition goals set during dietitian appointment.;Long Term Goal: Adherence to prescribed nutrition plan.             Nutrition Assessments:  MEDIFICTS Score Key: ?70 Need to make dietary changes  40-70 Heart Healthy Diet ? 40 Therapeutic Level Cholesterol Diet  Flowsheet Row Cardiac Rehab from 01/19/2021 in [facility] Cardiac and Pulmonary Rehab   Picture Your Plate Total Score on Admission 51      Picture Your Plate Scores: <93 Unhealthy dietary pattern with much room for improvement. 41-50 Dietary pattern unlikely to meet recommendations for good health and room for improvement. 51-60 More healthful dietary pattern, with some room for improvement.  >60 Healthy dietary pattern, although there may be some specific behaviors that could be improved.    Nutrition Goals Re-Evaluation:   Nutrition Goals Discharge (Final Nutrition Goals Re-Evaluation):   Psychosocial: Target Goals: Acknowledge presence or absence of significant depression and/or stress, maximize coping skills, provide positive support system. Participant is able to verbalize types and ability to use techniques and skills needed for reducing stress and depression.   Education: Stress, Anxiety, and Depression - Group verbal and visual presentation to define topics covered.  Reviews how body is impacted by stress, anxiety, and depression.  Also discusses healthy ways to reduce stress and to treat/manage anxiety and depression.  Written material given at graduation.   Education: Sleep Hygiene -Provides group verbal and written instruction about how sleep can affect your health.  Define sleep hygiene, discuss sleep cycles and impact of sleep habits. Review good sleep hygiene tips.    Initial Review & Psychosocial Screening:  Initial Psych Review & Screening - 12/31/20 1315       Initial Review   Current issues with Current Sleep Concerns      Family Dynamics   Good Support System? Yes   Wife, daughter, neighbors     Barriers   Psychosocial barriers to participate in program There are no identifiable barriers or psychosocial needs.;The patient should benefit from training in stress management and relaxation.      Screening Interventions   Interventions Encouraged to exercise;Provide feedback about the scores to participant;To provide support and resources with  identified psychosocial needs    Expected Outcomes Short Term goal: Utilizing psychosocial counselor, staff and physician to assist with identification of specific Stressors or current issues interfering with healing process. Setting desired goal for each stressor or current issue identified.;Long Term Goal: Stressors or current issues are controlled or eliminated.;Short Term goal: Identification and review with participant of any Quality of Life or Depression concerns found by scoring the questionnaire.;Long Term goal: The participant improves quality of Life and PHQ9 Scores as seen by post scores and/or verbalization of changes             Quality of Life Scores:   Quality of Life -  01/19/21 1255       Quality of Life   Select Quality of Life      Quality of Life Scores   Health/Function Pre 14.6 %    Socioeconomic Pre 28 %    Psych/Spiritual Pre 29.64 %    Family Pre 15.6 %    GLOBAL Pre 20.14 %            Scores of 19 and below usually indicate a poorer quality of life in these areas.  A difference of  2-3 points is a clinically meaningful difference.  A difference of 2-3 points in the total score of the Quality of Life Index has been associated with significant improvement in overall quality of life, self-image, physical symptoms, and general health in studies assessing change in quality of life.  PHQ-9: Recent Review Flowsheet Data     Depression screen [facility] 2/9 01/19/2021 10/25/2020 09/10/2020 02/26/2020 09/08/2019   Decreased Interest 0 0 0 0 0   Down, Depressed, Hopeless 0 0 0 0 0   PHQ - 2 Score 0 0 0 0 0   Altered sleeping 0 - - - -   Tired, decreased energy 2 - - - -   Change in appetite 0 - - - -   Feeling bad or failure about yourself  0 - - - -   Trouble concentrating 0 - - - -   Moving slowly or fidgety/restless 0 - - - -   Suicidal thoughts 0 - - - -   PHQ-9 Score 2 - - - -   Difficult doing work/chores Not difficult at all - - - -      Interpretation of  Total Score  Total Score Depression Severity:  1-4 = Minimal depression, 5-9 = Mild depression, 10-14 = Moderate depression, 15-19 = Moderately severe depression, 20-27 = Severe depression   Psychosocial Evaluation and Intervention:  Psychosocial Evaluation - 12/31/20 1326       Psychosocial Evaluation & Interventions   Interventions Encouraged to exercise with the program and follow exercise prescription    Comments Mr. Flanagan reports not feeling well and being very tired post NSTEMI w/ stent. His sleep has been altered due to his shortness of breath while lying down. He is in contact with his doctors and sees his hematologist next week to check up on his anemia.  He states the heart issues came as a surprise to him so he is motivated to work on his health. His friend did the program a while back and is encouraging him to participate as well. His wife and daughter (who moved in with them after their latest health issues) are very supportive. 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 It makes it hard for him to walk and participate in things. He is hopeful this program will help boost his stamina and help create a plan for future exercise and heart healthy living.    Expected Outcomes Short: attend cardiac rehab for education and exercise. Long: develop and maintain positive self care habits.    Continue Psychosocial Services  Follow up required by staff             Psychosocial Re-Evaluation:   Psychosocial Discharge (Final Psychosocial Re-Evaluation):   Vocational Rehabilitation: Provide vocational rehab assistance to qualifying candidates.   Vocational Rehab Evaluation & Intervention:  Vocational Rehab - 12/31/20 1312       Initial Vocational Rehab Evaluation & Intervention   Assessment  shows need for Vocational Rehabilitation No             Education: Education Goals: Education classes will be provided on a variety of topics  geared toward better understanding of heart health and risk factor modification. Participant will state understanding/return demonstration of topics presented as noted by education test scores.  Learning Barriers/Preferences:  Learning Barriers/Preferences - 12/31/20 1312       Learning Barriers/Preferences   Learning Barriers None    Learning Preferences None             General Cardiac Education Topics:  AED/CPR: - Group verbal and written instruction with the use of models to demonstrate the basic use of the AED with the basic ABC's of resuscitation.   Anatomy and Cardiac Procedures: - Group verbal and visual presentation and models provide information about basic cardiac anatomy and function. Reviews the testing methods done to diagnose heart disease and the outcomes of the test results. Describes the treatment choices: Medical Management, Angioplasty, or Coronary Bypass Surgery for treating various heart conditions including Myocardial Infarction, Angina, Valve Disease, and Cardiac Arrhythmias.  Written material given at graduation.   Medication Safety: - Group verbal and visual instruction to review commonly prescribed medications for heart and lung disease. Reviews the medication, class of the drug, and side effects. Includes the steps to properly store meds and maintain the prescription regimen.  Written material given at graduation.   Intimacy: - Group verbal instruction through game format to discuss how heart and lung disease can affect sexual intimacy. Written material given at graduation..   Know Your Numbers and Heart Failure: - Group verbal and visual instruction to discuss disease risk factors for cardiac and pulmonary disease and treatment options.  Reviews associated critical values for Overweight/Obesity, Hypertension, Cholesterol, and Diabetes.  Discusses basics of heart failure: signs/symptoms and treatments.  Introduces Heart Failure Zone chart for action plan  for heart failure.  Written material given at graduation. Flowsheet Row Cardiac Rehab from 01/19/2021 in [facility] Cardiac and [facility]monary Rehab  Education need identified 01/19/21       Infection Prevention: - Provides verbal and written material to individual with discussion of infection control including proper hand washing and proper equipment cleaning during exercise session. Flowsheet Row Cardiac Rehab from 01/19/2021 in [facility] Cardiac and Pulmonary Rehab  Education need identified 01/19/21  Date 01/19/21  Educator [place]  Instruction Review Code 1- Verbalizes Understanding       Falls Prevention: - Provides verbal and written material to individual with discussion of falls prevention and safety. Flowsheet Row Cardiac Rehab from 01/19/2021 in [facility] Cardiac and Pulmonary Rehab  Education need identified 01/19/21  Date 01/19/21  Educator [place]  Instruction Review Code 1- Verbalizes Understanding       Other: -Provides group and verbal instruction on various topics (see comments)   Knowledge Questionnaire Score:  Knowledge Questionnaire Score - 01/19/21 1244       Knowledge Questionnaire Score   Pre Score 22/26: MI, Nitro, Exercise, A&P             Core Components/Risk Factors/Patient Goals at Admission:  Personal Goals and Risk Factors at Admission - 01/19/21 1300       Core Components/Risk Factors/Patient Goals on Admission    Weight Management Yes;Weight Maintenance    Intervention Weight Management: Develop a combined nutrition and exercise program designed to reach desired caloric intake, while maintaining appropriate intake of nutrient and fiber, sodium and fats, and appropriate energy expenditure required for  the weight goal.;Weight Management: Provide education and appropriate resources to help participant work on and attain dietary goals.;Weight Management/Obesity: Establish reasonable short term and long term weight goals.    Admit Weight 189 lb (85.7 kg)    Goal  Weight: Short Term 189 lb (85.7 kg)    Goal Weight: Long Term 189 lb (85.7 kg)    Expected Outcomes Short Term: Continue to assess and modify interventions until short term weight is achieved;Long Term: Adherence to nutrition and physical activity/exercise program aimed toward attainment of established weight goal;Weight Maintenance: Understanding of the daily nutrition guidelines, which includes 25-35% calories from fat, 7% or less cal from saturated fats, less than 251m cholesterol, less than 1.5gm of sodium, & 5 or more servings of fruits and vegetables daily;Understanding recommendations for meals to include 15-35% energy as protein, 25-35% energy from fat, 35-60% energy from carbohydrates, less than 2053mof dietary cholesterol, 20-35 gm of total fiber daily;Understanding of distribution of calorie intake throughout the day with the consumption of 4-5 meals/snacks    Diabetes Yes    Intervention Provide education about signs/symptoms and action to take for hypo/hyperglycemia.;Provide education about proper nutrition, including hydration, and aerobic/resistive exercise prescription along with prescribed medications to achieve blood glucose in normal ranges: Fasting glucose 65-99 mg/dL    Expected Outcomes Short Term: Participant verbalizes understanding of the signs/symptoms and immediate care of hyper/hypoglycemia, proper foot care and importance of medication, aerobic/resistive exercise and nutrition plan for blood glucose control.;Long Term: Attainment of HbA1C < 7%.    Hypertension Yes    Intervention Provide education on lifestyle modifcations including regular physical activity/exercise, weight management, moderate sodium restriction and increased consumption of fresh fruit, vegetables, and low fat dairy, alcohol moderation, and smoking cessation.;Monitor prescription use compliance.    Expected Outcomes Short Term: Continued assessment and intervention until BP is < 140/9068mG in hypertensive  participants. < 130/42m26m in hypertensive participants with diabetes, heart failure or chronic kidney disease.;Long Term: Maintenance of blood pressure at goal levels.    Lipids Yes    Intervention Provide education and support for participant on nutrition & aerobic/resistive exercise along with prescribed medications to achieve LDL <70mg49mL >40mg.62mExpected Outcomes Short Term: Participant states understanding of desired cholesterol values and is compliant with medications prescribed. Participant is following exercise prescription and nutrition guidelines.;Long Term: Cholesterol controlled with medications as prescribed, with individualized exercise RX and with personalized nutrition plan. Value goals: LDL < 70mg, 36m> 40 mg.             Education:Diabetes - Individual verbal and written instruction to review signs/symptoms of diabetes, desired ranges of glucose level fasting, after meals and with exercise. Acknowledge that pre and post exercise glucose checks will be done for 3 sessions at entry of program. [place]  Education need identified 01/19/21  Date 01/19/21  Educator [place]  InsMaltauction Review Code 1- Verbalizes Understanding       Core Components/Risk Factors/Patient Goals Review:    Core Components/Risk Factors/Patient Goals at Discharge (Final Review):    ITP Comments:  ITP Comments     Row [place]1/18/22 1303 01/19/21 1309 01/24/21 1353 01/31/21 1432 02/09/21 0840   ITP Comments Initial telephone orientation completed. [facility] for Wednesday 12/7 at 11am. Completed 6MWT and gym orientation. Initial ITP created and sent for review to Dr. Mark MiEmily Filbertal Director. First full  day of exercise!  Patient was oriented to gym and equipment including functions, settings, policies, and procedures.  Patient's individual exercise prescription and  treatment plan were reviewed.  All starting workloads were established based on the results of the 6 minute walk test done at initial orientation visit.  The plan for exercise progression was also introduced and progression will be customized based on patient's performance and goals. Completed initial RD Consultation 30 Day review completed. Medical Director ITP review done, changes made as directed, and signed approval by Medical Director.            Comments:

## 2021-02-09 NOTE — Progress Notes (Signed)
Daily Session Note  Patient Details  Name: Sean Reyes. MRN: [id number] Date of Birth: [birth year] Referring Provider:   Flowsheet Row Cardiac Rehab from 01/19/2021 in [facility] Cardiac and Pulmonary Rehab  Referring Provider [name] MD       Encounter Date: 02/09/2021  Check In:  Session Check In - 02/09/21 1358       Check-In   Supervising physician immediately available to respond to emergencies See telemetry face sheet for immediately available ER MD    Location [facility]-Cardiac & Pulmonary Rehab    Staff Present Renita Papa, RN BSN;Joseph [place], RCP,RRT,BSRT;Laureen [place], [place], RRT, CPFT    Virtual Visit No    Medication changes reported     No    Fall or balance concerns reported    No    Warm-up and Cool-down Performed on first and last piece of equipment    Resistance Training Performed Yes    VAD Patient? No    PAD/SET Patient? No      Pain Assessment   Currently in Pain? No/denies                Social History   Tobacco Use  Smoking Status Never  Smokeless Tobacco Never    Goals Met:  Independence with exercise equipment Exercise tolerated well No report of concerns or symptoms today Strength training completed today  Goals Unmet:  Not Applicable  Comments: Pt able to follow exercise prescription today without complaint.  Will continue to monitor for progression.    Dr. Emily Filbert is Medical Director for [place].  Dr. Ottie Glazier is Medical Director for [facility] Pulmonary Rehabilitation.

## 2021-02-16 ENCOUNTER — Encounter: Payer: Medicare Other | Attending: Cardiology

## 2021-02-16 ENCOUNTER — Other Ambulatory Visit: Payer: Self-pay

## 2021-02-16 DIAGNOSIS — I214 Non-ST elevation (NSTEMI) myocardial infarction: Secondary | ICD-10-CM

## 2021-02-16 DIAGNOSIS — Z5189 Encounter for other specified aftercare: Secondary | ICD-10-CM | POA: Insufficient documentation

## 2021-02-16 DIAGNOSIS — I252 Old myocardial infarction: Secondary | ICD-10-CM | POA: Diagnosis not present

## 2021-02-16 DIAGNOSIS — Z955 Presence of coronary angioplasty implant and graft: Secondary | ICD-10-CM | POA: Diagnosis not present

## 2021-02-16 NOTE — Progress Notes (Signed)
Daily Session Note  Patient Details  Name: Bronx Brogden. MRN: [id number] Date of Birth: 03/10/1940 Referring Provider:   Flowsheet Row Cardiac Rehab from 01/19/2021 in [facility] Cardiac and Pulmonary Rehab  Referring Provider [name] MD       Encounter Date: 02/16/2021  Check In:  Session Check In - 02/16/21 1358       Check-In   Supervising physician immediately available to respond to emergencies See telemetry face sheet for immediately available ER MD    Location [facility]-Cardiac & Pulmonary Rehab    Staff Present Birdie Sons, MPA, Nino Glow, MS, ASCM CEP, Exercise Physiologist;Joseph Tessie Fass, [place]    Virtual Visit No    Medication changes reported     No    Fall or balance concerns reported    No    Warm-up and Cool-down Performed on first and last piece of equipment    Resistance Training Performed Yes    VAD Patient? No    PAD/SET Patient? No      Pain Assessment   Currently in Pain? No/denies                Social History   Tobacco Use  Smoking Status Never  Smokeless Tobacco Never    Goals Met:  Independence with exercise equipment Exercise tolerated well No report of concerns or symptoms today Strength training completed today  Goals Unmet:  Not Applicable  Comments: Pt able to follow exercise prescription today without complaint.  Will continue to monitor for progression.    Dr. Emily Filbert is Medical Director for [place].  Dr. Ottie Glazier is Medical Director for [facility] Pulmonary Rehabilitation.

## 2021-02-21 ENCOUNTER — Other Ambulatory Visit: Payer: Self-pay

## 2021-02-21 DIAGNOSIS — I252 Old myocardial infarction: Secondary | ICD-10-CM | POA: Diagnosis not present

## 2021-02-21 DIAGNOSIS — I214 Non-ST elevation (NSTEMI) myocardial infarction: Secondary | ICD-10-CM

## 2021-02-21 DIAGNOSIS — Z955 Presence of coronary angioplasty implant and graft: Secondary | ICD-10-CM

## 2021-02-21 DIAGNOSIS — Z5189 Encounter for other specified aftercare: Secondary | ICD-10-CM | POA: Diagnosis not present

## 2021-02-21 NOTE — Progress Notes (Signed)
Daily Session Note  Patient Details  Name: Sean Reyes. MRN: [id number] Date of Birth: Jun 26, 1940 Referring Provider:   Flowsheet Row Cardiac Rehab from 01/19/2021 in [facility] Cardiac and Pulmonary Rehab  Referring Provider [name] MD       Encounter Date: 02/21/2021  Check In:  Session Check In - 02/21/21 1406       Check-In   Supervising physician immediately available to respond to emergencies See telemetry face sheet for immediately available ER MD    Location [facility]-Cardiac & Pulmonary Rehab    Staff Present Birdie Sons, MPA, RN;Joseph Tessie Fass, Sharren Bridge, MS, ASCM CEP, Exercise Physiologist    Virtual Visit No    Medication changes reported     No    Fall or balance concerns reported    No    Warm-up and Cool-down Performed on first and last piece of equipment    Resistance Training Performed Yes    VAD Patient? No    PAD/SET Patient? No      Pain Assessment   Currently in Pain? No/denies                Social History   Tobacco Use  Smoking Status Never  Smokeless Tobacco Never    Goals Met:  Independence with exercise equipment Exercise tolerated well Personal goals reviewed No report of concerns or symptoms today Strength training completed today  Goals Unmet:  Not Applicable  Comments: Pt able to follow exercise prescription today without complaint.  Will continue to monitor for progression.  Reviewed home exercise with pt today.  Pt plans to walk in the house and use staff videos for exercise.  Reviewed THR, pulse, RPE, sign and symptoms, pulse oximetery and when to call 911 or MD.  Also discussed weather considerations and indoor options.  Pt voiced understanding.   Dr. Emily Filbert is Medical Director for [place].  Dr. Ottie Glazier is Medical Director for [facility] Pulmonary Rehabilitation.

## 2021-02-25 ENCOUNTER — Other Ambulatory Visit: Payer: Self-pay

## 2021-02-25 ENCOUNTER — Encounter: Payer: Medicare Other | Admitting: *Deleted

## 2021-02-25 DIAGNOSIS — I252 Old myocardial infarction: Secondary | ICD-10-CM | POA: Diagnosis not present

## 2021-02-25 DIAGNOSIS — Z5189 Encounter for other specified aftercare: Secondary | ICD-10-CM | POA: Diagnosis not present

## 2021-02-25 DIAGNOSIS — I214 Non-ST elevation (NSTEMI) myocardial infarction: Secondary | ICD-10-CM

## 2021-02-25 DIAGNOSIS — Z955 Presence of coronary angioplasty implant and graft: Secondary | ICD-10-CM | POA: Diagnosis not present

## 2021-02-25 NOTE — Progress Notes (Signed)
Daily Session Note  Patient Details  Name: Sean Reyes. MRN: [id number] Date of Birth: 06-29-1940 Referring Provider:   Flowsheet Row Cardiac Rehab from 01/19/2021 in [facility] Cardiac and Pulmonary Rehab  Referring Provider [name] MD       Encounter Date: 02/25/2021  Check In:  Session Check In - 02/25/21 1058       Check-In   Supervising physician immediately available to respond to emergencies See telemetry face sheet for immediately available ER MD    Location [facility]-Cardiac & Pulmonary Rehab    Staff Present Renita Papa, RN BSN;Joseph [place], RCP,RRT,BSRT;Jessica [place], [place], RCEP, CCRP, CCET    Virtual Visit No    Medication changes reported     No    Fall or balance concerns reported    No    Warm-up and Cool-down Performed on first and last piece of equipment    Resistance Training Performed Yes    VAD Patient? No    PAD/SET Patient? No      Pain Assessment   Currently in Pain? No/denies                Social History   Tobacco Use  Smoking Status Never  Smokeless Tobacco Never    Goals Met:  Independence with exercise equipment Exercise tolerated well No report of concerns or symptoms today Strength training completed today  Goals Unmet:  Not Applicable  Comments: Pt able to follow exercise prescription today without complaint.  Will continue to monitor for progression.    Dr. Emily Filbert is Medical Director for [place].  Dr. Ottie Glazier is Medical Director for [facility] Pulmonary Rehabilitation.

## 2021-03-02 ENCOUNTER — Other Ambulatory Visit: Payer: Self-pay

## 2021-03-02 DIAGNOSIS — Z955 Presence of coronary angioplasty implant and graft: Secondary | ICD-10-CM | POA: Diagnosis not present

## 2021-03-02 DIAGNOSIS — I252 Old myocardial infarction: Secondary | ICD-10-CM | POA: Diagnosis not present

## 2021-03-02 DIAGNOSIS — I214 Non-ST elevation (NSTEMI) myocardial infarction: Secondary | ICD-10-CM

## 2021-03-02 DIAGNOSIS — Z5189 Encounter for other specified aftercare: Secondary | ICD-10-CM | POA: Diagnosis not present

## 2021-03-02 NOTE — Progress Notes (Signed)
Daily Session Note  Patient Details  Name: Sean Reyes. MRN: [id number] Date of Birth: 1940-10-24 Referring Provider:   Flowsheet Row Cardiac Rehab from 01/19/2021 in [facility] Cardiac and Pulmonary Rehab  Referring Provider [name] MD       Encounter Date: 03/02/2021  Check In:  Session Check In - 03/02/21 1340       Check-In   Supervising physician immediately available to respond to emergencies See telemetry face sheet for immediately available ER MD    Location [facility]-Cardiac & Pulmonary Rehab    Staff Present Birdie Sons, MPA, RN;Joseph Tessie Fass, Sharren Bridge, MS, ASCM CEP, Exercise Physiologist    Virtual Visit No    Medication changes reported     No    Fall or balance concerns reported    No    Warm-up and Cool-down Performed on first and last piece of equipment    Resistance Training Performed Yes    VAD Patient? No    PAD/SET Patient? No      Pain Assessment   Currently in Pain? No/denies                Social History   Tobacco Use  Smoking Status Never  Smokeless Tobacco Never    Goals Met:  Independence with exercise equipment Exercise tolerated well No report of concerns or symptoms today Strength training completed today  Goals Unmet:  Not Applicable  Comments: Pt able to follow exercise prescription today without complaint.  Will continue to monitor for progression.    Dr. Emily Filbert is Medical Director for [place].  Dr. Ottie Glazier is Medical Director for [facility] Pulmonary Rehabilitation.

## 2021-03-04 ENCOUNTER — Other Ambulatory Visit: Payer: Self-pay

## 2021-03-04 ENCOUNTER — Encounter: Payer: Medicare Other | Admitting: *Deleted

## 2021-03-04 DIAGNOSIS — I214 Non-ST elevation (NSTEMI) myocardial infarction: Secondary | ICD-10-CM

## 2021-03-04 DIAGNOSIS — I252 Old myocardial infarction: Secondary | ICD-10-CM | POA: Diagnosis not present

## 2021-03-04 DIAGNOSIS — Z955 Presence of coronary angioplasty implant and graft: Secondary | ICD-10-CM | POA: Diagnosis not present

## 2021-03-04 DIAGNOSIS — Z5189 Encounter for other specified aftercare: Secondary | ICD-10-CM | POA: Diagnosis not present

## 2021-03-04 NOTE — Progress Notes (Signed)
Daily Session Note  Patient Details  Name: Sean Reyes. MRN: [id number] Date of Birth: [birth year] Referring Provider:   Flowsheet Row Cardiac Rehab from 01/19/2021 in [facility] Cardiac and Pulmonary Rehab  Referring Provider [name] MD       Encounter Date: 03/04/2021  Check In:  Session Check In - 03/04/21 1109       Check-In   Supervising physician immediately available to respond to emergencies See telemetry face sheet for immediately available ER MD    Location [facility]-Cardiac & Pulmonary Rehab    Staff Present Renita Papa, RN BSN;Joseph [place], RCP,RRT,BSRT;Jessica [place], [place], RCEP, CCRP, CCET    Virtual Visit No    Medication changes reported     No    Fall or balance concerns reported    No    Warm-up and Cool-down Performed on first and last piece of equipment    Resistance Training Performed Yes    VAD Patient? No    PAD/SET Patient? No      Pain Assessment   Currently in Pain? No/denies                Social History   Tobacco Use  Smoking Status Never  Smokeless Tobacco Never    Goals Met:  Independence with exercise equipment Exercise tolerated well No report of concerns or symptoms today Strength training completed today  Goals Unmet:  Not Applicable  Comments: Pt able to follow exercise prescription today without complaint.  Will continue to monitor for progression.    Dr. Emily Filbert is Medical Director for [place].  Dr. Ottie Glazier is Medical Director for [facility] Pulmonary Rehabilitation.

## 2021-03-07 ENCOUNTER — Other Ambulatory Visit: Payer: Self-pay

## 2021-03-07 DIAGNOSIS — Z955 Presence of coronary angioplasty implant and graft: Secondary | ICD-10-CM | POA: Diagnosis not present

## 2021-03-07 DIAGNOSIS — I214 Non-ST elevation (NSTEMI) myocardial infarction: Secondary | ICD-10-CM

## 2021-03-07 DIAGNOSIS — Z5189 Encounter for other specified aftercare: Secondary | ICD-10-CM | POA: Diagnosis not present

## 2021-03-07 DIAGNOSIS — I252 Old myocardial infarction: Secondary | ICD-10-CM | POA: Diagnosis not present

## 2021-03-07 NOTE — Progress Notes (Signed)
Daily Session Note  Patient Details  Name: Sean Reyes. MRN: [id number] Date of Birth: 08-Apr-1940 Referring Provider:   Flowsheet Row Cardiac Rehab from 01/19/2021 in [facility] Cardiac and Pulmonary Rehab  Referring Provider [name] MD       Encounter Date: 03/07/2021  Check In:  Session Check In - 03/07/21 1413       Check-In   Supervising physician immediately available to respond to emergencies See telemetry face sheet for immediately available ER MD    Location [facility]-Cardiac & Pulmonary Rehab    Staff Present Birdie Sons, MPA, Nino Glow, MS, ASCM CEP, Exercise Physiologist;Joseph Tessie Fass, [place]    Virtual Visit No    Medication changes reported     No    Fall or balance concerns reported    No    Warm-up and Cool-down Performed on first and last piece of equipment    Resistance Training Performed Yes    VAD Patient? No    PAD/SET Patient? No      Pain Assessment   Currently in Pain? No/denies                Social History   Tobacco Use  Smoking Status Never  Smokeless Tobacco Never    Goals Met:  Independence with exercise equipment Exercise tolerated well No report of concerns or symptoms today Strength training completed today  Goals Unmet:  Not Applicable  Comments: Pt able to follow exercise prescription today without complaint.  Will continue to monitor for progression.    Dr. Emily Filbert is Medical Director for [place].  Dr. Ottie Glazier is Medical Director for [facility] Pulmonary Rehabilitation.

## 2021-03-08 DIAGNOSIS — M25531 Pain in right wrist: Secondary | ICD-10-CM | POA: Diagnosis not present

## 2021-03-08 DIAGNOSIS — I739 Peripheral vascular disease, unspecified: Secondary | ICD-10-CM | POA: Diagnosis not present

## 2021-03-08 DIAGNOSIS — M19031 Primary osteoarthritis, right wrist: Secondary | ICD-10-CM | POA: Diagnosis not present

## 2021-03-08 DIAGNOSIS — E1122 Type 2 diabetes mellitus with diabetic chronic kidney disease: Secondary | ICD-10-CM | POA: Diagnosis not present

## 2021-03-09 ENCOUNTER — Encounter: Payer: Self-pay | Admitting: *Deleted

## 2021-03-09 ENCOUNTER — Inpatient Hospital Stay (HOSPITAL_BASED_OUTPATIENT_CLINIC_OR_DEPARTMENT_OTHER): Payer: Medicare Other | Admitting: Internal Medicine

## 2021-03-09 ENCOUNTER — Inpatient Hospital Stay: Payer: Medicare Other

## 2021-03-09 ENCOUNTER — Other Ambulatory Visit: Payer: Self-pay

## 2021-03-09 ENCOUNTER — Encounter: Payer: Self-pay | Admitting: Internal Medicine

## 2021-03-09 ENCOUNTER — Inpatient Hospital Stay: Payer: Medicare Other | Attending: Internal Medicine

## 2021-03-09 DIAGNOSIS — D649 Anemia, unspecified: Secondary | ICD-10-CM | POA: Insufficient documentation

## 2021-03-09 DIAGNOSIS — I2510 Atherosclerotic heart disease of native coronary artery without angina pectoris: Secondary | ICD-10-CM | POA: Insufficient documentation

## 2021-03-09 DIAGNOSIS — I214 Non-ST elevation (NSTEMI) myocardial infarction: Secondary | ICD-10-CM

## 2021-03-09 DIAGNOSIS — N183 Chronic kidney disease, stage 3 unspecified: Secondary | ICD-10-CM | POA: Insufficient documentation

## 2021-03-09 DIAGNOSIS — D696 Thrombocytopenia, unspecified: Secondary | ICD-10-CM | POA: Diagnosis not present

## 2021-03-09 DIAGNOSIS — Z7901 Long term (current) use of anticoagulants: Secondary | ICD-10-CM | POA: Insufficient documentation

## 2021-03-09 DIAGNOSIS — Z955 Presence of coronary angioplasty implant and graft: Secondary | ICD-10-CM

## 2021-03-09 LAB — COMPREHENSIVE METABOLIC PANEL
ALT: 24 U/L (ref 0–44)
AST: 28 U/L (ref 15–41)
Albumin: 3.9 g/dL (ref 3.5–5.0)
Alkaline Phosphatase: 62 U/L (ref 38–126)
Anion gap: 7 (ref 5–15)
BUN: 23 mg/dL (ref 8–23)
CO2: 28 mmol/L (ref 22–32)
Calcium: 9.4 mg/dL (ref 8.9–10.3)
Chloride: 100 mmol/L (ref 98–111)
Creatinine, Ser: 1.43 mg/dL — ABNORMAL HIGH (ref 0.61–1.24)
GFR, Estimated: 50 mL/min — ABNORMAL LOW (ref >60)
Glucose, Bld: 188 mg/dL — ABNORMAL HIGH (ref 70–99)
Potassium: 4.5 mmol/L (ref 3.5–5.1)
Sodium: 135 mmol/L (ref 135–145)
Total Bilirubin: 0.3 mg/dL (ref 0.3–1.2)
Total Protein: 7.3 g/dL (ref 6.5–8.1)

## 2021-03-09 LAB — CBC WITH DIFFERENTIAL/PLATELET
Abs Immature Granulocytes: 0.05 10*3/uL (ref 0.00–0.07)
Basophils Absolute: 0 10*3/uL (ref 0.0–0.1)
Basophils Relative: 0 %
Eosinophils Absolute: 0.1 10*3/uL (ref 0.0–0.5)
Eosinophils Relative: 2 %
HCT: 35.7 % — ABNORMAL LOW (ref 39.0–52.0)
Hemoglobin: 12.2 g/dL — ABNORMAL LOW (ref 13.0–17.0)
Immature Granulocytes: 1 %
Lymphocytes Relative: 29 %
Lymphs Abs: 1.5 10*3/uL (ref 0.7–4.0)
MCH: 31.7 pg (ref 26.0–34.0)
MCHC: 34.2 g/dL (ref 30.0–36.0)
MCV: 92.7 fL (ref 80.0–100.0)
Monocytes Absolute: 0.6 10*3/uL (ref 0.1–1.0)
Monocytes Relative: 12 %
Neutro Abs: 3 10*3/uL (ref 1.7–7.7)
Neutrophils Relative %: 56 %
Platelets: 148 10*3/uL — ABNORMAL LOW (ref 150–400)
RBC: 3.85 MIL/uL — ABNORMAL LOW (ref 4.22–5.81)
RDW: 13.4 % (ref 11.5–15.5)
WBC: 5.3 10*3/uL (ref 4.0–10.5)
nRBC: 0 % (ref 0.0–0.2)

## 2021-03-09 LAB — RETICULOCYTES
Immature Retic Fract: 14 % (ref 2.3–15.9)
RBC.: 3.84 MIL/uL — ABNORMAL LOW (ref 4.22–5.81)
Retic Count, Absolute: 49.2 10*3/uL (ref 19.0–186.0)
Retic Ct Pct: 1.3 % (ref 0.4–3.1)

## 2021-03-09 LAB — LACTATE DEHYDROGENASE: LDH: 129 U/L (ref 98–192)

## 2021-03-09 LAB — TECHNOLOGIST SMEAR REVIEW
Plt Morphology: NORMAL
RBC MORPHOLOGY: NORMAL
WBC MORPHOLOGY: NORMAL

## 2021-03-09 NOTE — Patient Instructions (Signed)
Start taking your slow release iron pill once a day

## 2021-03-09 NOTE — Assessment & Plan Note (Addendum)
Mild to moderate symptomatic anemia Hb 11; ferritin-90; iron sat- 18%.Santina Evans CKD-III. S/p IV venofer weekly x4; January 2023 -Hb- 12.2; on slow release PO iron. HOLD IV venofer.   # Thrombocytopenia mild 148 platelets-monitor for now.  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   # CAD [sp stenting OCT 2022; Dr.Kowalski]; on Eliquis/antiplatelet therapy.  # DISPOSITION:  # HOLD venofer- # in 3 months- MD; labs- cbc/cmp; iron studies/feritin-;possible venofer- Dr.B

## 2021-03-09 NOTE — Progress Notes (Signed)
Pt states he thought he was finished with treatments, would like to discuss.

## 2021-03-09 NOTE — Progress Notes (Signed)
Cardiac Individual Treatment Plan  Patient Details  Name: Sean Reyes. MRN: [id number] Date of Birth: [birth year] Referring Provider:   Flowsheet Row Cardiac Rehab from 01/19/2021 in [facility] Cardiac and Pulmonary Rehab  Referring Provider [name] MD       Initial Encounter Date:  Flowsheet Row Cardiac Rehab from 01/19/2021 in [facility] Cardiac and Pulmonary Rehab  Date 01/19/21       Visit Diagnosis: NSTEMI (non-ST elevated myocardial infarction) [facility])  Status post coronary artery stent placement  Patient's Home Medications on Admission:  Current Outpatient Medications:    acetaminophen (TYLENOL) 500 MG tablet, Take 500 mg by mouth every 6 (six) hours as needed (pain)., Disp: , Rfl:    benazepril (LOTENSIN) 10 MG tablet, Take 10 mg by mouth daily. Patient states that he takes this PRN, Disp: , Rfl:    ELIQUIS 5 MG TABS tablet, Take 1 tablet (5 mg total) by mouth 2 (two) times daily., Disp: 180 tablet, Rfl: 4   ferrous sulfate 325 (65 FE) MG tablet, Take by mouth., Disp: , Rfl:    finasteride (PROSCAR) 5 MG tablet, Take 1 tablet (5 mg total) by mouth daily., Disp: 90 tablet, Rfl: 2   gabapentin (NEURONTIN) 300 MG capsule, Take 1 capsule (300 mg total) by mouth 2 (two) times daily., Disp: 180 capsule, Rfl: 4   isosorbide mononitrate (IMDUR) 30 MG 24 hr tablet, Take 30 mg by mouth daily., Disp: , Rfl:    metFORMIN (GLUCOPHAGE) 500 MG tablet, Take by mouth 2 (two) times daily with a meal., Disp: , Rfl:    metoprolol succinate (TOPROL-XL) 25 MG 24 hr tablet, Take 1 tablet by mouth daily., Disp: , Rfl:    omeprazole (PRILOSEC) 10 MG capsule, Take 10 mg by mouth daily., Disp: , Rfl:    pantoprazole (PROTONIX) 40 MG tablet, Take by mouth. (Patient not taking: Reported on 01/05/2021), Disp: , Rfl:    propafenone (RYTHMOL SR) 425 MG 12 hr capsule, Take 425 mg by mouth 2 (two) times daily. Has been out for 3 days, Disp: , Rfl:    rosuvastatin (CRESTOR) 5 MG tablet, Take 1 tablet (5 mg  total) by mouth daily., Disp: 90 tablet, Rfl: 2   tamsulosin (FLOMAX) 0.4 MG CAPS capsule, Take 0.8 mg by mouth daily., Disp: , Rfl:    ticagrelor (BRILINTA) 90 MG TABS tablet, Take 1 tablet (90 mg total) by mouth 2 (two) times daily., Disp: 60 tablet, Rfl: 0   trimethoprim-polymyxin b (POLYTRIM) ophthalmic solution, Apply to eye. 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   Hyperlipidemia    Hypertension    Nocturia    S/P ablation of atrial fibrillation    Ablative therapy   Sleep apnea    CPAP    Spinal stenosis    Tachycardia,  unspecified     Tobacco Use: Social History   Tobacco Use  Smoking Status Never  Smokeless Tobacco Never    Labs: Recent Review Flowsheet Data     Labs for ITP Cardiac and Pulmonary Rehab Latest Ref Rng & Units 02/26/2020 06/10/2020 09/09/2020 12/10/2020 12/21/2020   Cholestrol 0 - 200 mg/dL 120 - 105 - 94   LDLCALC 0 - 99 mg/dL 58 - 43 - 45   HDL >40 mg/dL 43 - 41 - 36(L)   Trlycerides <150 mg/dL 103 - 113 - 67   Hemoglobin A1c 4.8 - 5.6 % 6.6 6.7 6.6 6.5(H) 6.6(H)        Exercise Target Goals: Exercise Program Goal: Individual exercise prescription set using results from initial 6 min walk test and THRR while considering  patients activity barriers and safety.   Exercise Prescription Goal: Initial exercise prescription builds to 30-45 minutes a day of aerobic activity, 2-3 days per week.  Home exercise  guidelines will be given to patient during program as part of exercise prescription that the participant will acknowledge.   Education: Aerobic Exercise: - Group verbal and visual presentation on the components of exercise prescription. Introduces F.I.T.T principle from ACSM for exercise prescriptions.  Reviews F.I.T.T. principles of aerobic exercise including progression. Written material given at graduation. Flowsheet Row Cardiac Rehab from 03/02/2021 in [facility] Cardiac and Pulmonary Rehab  Date 02/09/21  Educator AS  Instruction Review Code 1- Verbalizes Understanding       Education: Resistance Exercise: - Group verbal and visual presentation on the components of exercise prescription. Introduces F.I.T.T principle from ACSM for exercise prescriptions  Reviews F.I.T.T. principles of resistance exercise including progression. Written material given at graduation. Flowsheet Row Cardiac Rehab from 03/02/2021 in [facility] Cardiac and Pulmonary Rehab  Date 02/16/21  Educator AS  Instruction Review Code 1- Verbalizes Understanding        Education: Exercise & Equipment Safety: - Individual verbal instruction and demonstration of equipment use and safety with use of the equipment. Flowsheet Row Cardiac Rehab from 03/02/2021 in [facility] Cardiac and Pulmonary Rehab  Education need identified 01/19/21  Date 01/19/21  Educator [place]  Instruction Review Code 1- Verbalizes Understanding       Education: Exercise Physiology & General Exercise Guidelines: - Group verbal and written instruction with models to review the exercise physiology of the cardiovascular system and associated critical values. Provides general exercise guidelines with specific guidelines to those with heart or lung disease.  Flowsheet Row Cardiac Rehab from 03/02/2021 in [facility] Cardiac and Pulmonary Rehab  Education need identified 01/19/21       Education: Flexibility, Balance, Mind/Body Relaxation: - Group verbal and visual  presentation with interactive activity on the components of exercise prescription. Introduces F.I.T.T principle from ACSM for exercise prescriptions. Reviews F.I.T.T. principles of flexibility and balance exercise training including progression. Also discusses the mind body connection.  Reviews various relaxation techniques to help reduce and manage stress (i.e. Deep breathing, progressive muscle relaxation, and visualization). Balance handout provided to take home. Written material given at graduation.   Activity Barriers & Risk Stratification:  Activity Barriers & Cardiac Risk Stratification - 01/19/21 1245       Activity Barriers & Cardiac Risk Stratification   Activity Barriers Other (comment);Deconditioning;Back Problems;Joint Problems;Left Hip Replacement;Right Hip Replacement;Muscular Weakness;Assistive Device    Comments neuropathy; pain stimulator inserted April 2021 for back; 3 hip replacements, neuropathy in legs    Cardiac Risk Stratification High  6 Minute Walk:  6 Minute Walk     Row Name 01/19/21 1250         6 Minute Walk   Phase Initial     Distance 550 feet     Walk Time 6 minutes     # of Rest Breaks 0     MPH 1.04     METS 1.2     RPE 12     Perceived Dyspnea  1     VO2 Peak 4.21     Symptoms Yes (comment)     Comments Fatigue, Back pain 4/10     Resting HR 58 bpm     Resting BP 138/72     Resting Oxygen Saturation  99 %     Exercise Oxygen Saturation  during 6 min walk 98 %     Max Ex. HR 79 bpm     Max Ex. BP 146/70     2 Minute Post BP 140/72              Oxygen Initial Assessment:   Oxygen Re-Evaluation:   Oxygen Discharge (Final Oxygen Re-Evaluation):   Initial Exercise Prescription:  Initial Exercise Prescription - 01/19/21 1200       Date of Initial Exercise RX and Referring Provider   Date 01/19/21    Referring Provider Paraschos, Alexander MD      Oxygen   Maintain Oxygen Saturation 88% or higher       Treadmill   MPH 1    Grade 0    Minutes 15    METs 1.8      Recumbant Bike   Level 1    RPM 60    Watts 15    Minutes 15    METs 1      NuStep   Level 1    SPM 80    Minutes 15    METs 1      REL-XR   Level 1    Speed 50    Minutes 15    METs 1      Prescription Details   Frequency (times per week) 2    Duration Progress to 30 minutes of continuous aerobic without signs/symptoms of physical distress      Intensity   THRR 40-80% of Max Heartrate 91-125    Ratings of Perceived Exertion 11-13    Perceived Dyspnea 0-4      Progression   Progression Continue to progress workloads to maintain intensity without signs/symptoms of physical distress.      Resistance Training   Training Prescription Yes    Weight 3 lb    Reps 10-15             Perform Capillary Blood Glucose checks as needed.  Exercise Prescription Changes:   Exercise Prescription Changes     Row Name 01/19/21 1200 01/31/21 1000 02/15/21 1400 03/03/21 0900       Response to Exercise   Blood Pressure (Admit) 138/72 126/64 122/68 122/60    Blood Pressure (Exercise) 146/70 116/60 124/70 124/62    Blood Pressure (Exit) 140/72 124/62 112/60 108/60    Heart Rate (Admit) 58 bpm 51 bpm 68 bpm 58 bpm    Heart Rate (Exercise) 79 bpm 66 bpm 77 bpm 93 bpm    Heart Rate (Exit) 62 bpm 56 bpm 64 bpm 61 bpm    Oxygen Saturation (Admit) 99 % -- -- --    Oxygen Saturation (Exercise) 98 % -- -- --  Oxygen Saturation (Exit) 98 % -- -- --    Rating of Perceived Exertion (Exercise) 12 13 12 13     Perceived Dyspnea (Exercise) 1 -- -- --    Symptoms Fatigue, back pain 4/10 hip pain -- none    Comments walk test results 2nd full day of exercise -- --    Duration -- Progress to 30 minutes of  aerobic without signs/symptoms of physical distress Progress to 30 minutes of  aerobic without signs/symptoms of physical distress Continue with 30 min of aerobic exercise without signs/symptoms of physical distress.     Intensity -- THRR unchanged THRR unchanged THRR unchanged      Progression   Progression -- Continue to progress workloads to maintain intensity without signs/symptoms of physical distress. Continue to progress workloads to maintain intensity without signs/symptoms of physical distress. Continue to progress workloads to maintain intensity without signs/symptoms of physical distress.    Average METs -- 2.31 3 2.5      Resistance Training   Training Prescription -- Yes Yes Yes    Weight -- 3 lb 3 lb 3 lb    Reps -- 10-15 10-15 10-15      Interval Training   Interval Training -- No No No      Treadmill   MPH -- 1 -- --    Grade -- 0 -- --    Minutes -- 15 -- --    METs -- 1.8 -- --      REL-XR   Level -- 1 1 2     Minutes -- 15 15 30     METs -- 2.7 3 3       Biostep-RELP   Level -- -- -- 2    Minutes -- -- -- 30    METs -- -- -- 2      Home Exercise Plan   Plans to continue exercise at -- -- -- Home (comment)  walking, chair exercises    Frequency -- -- -- Add 2 additional days to program exercise sessions.    Initial Home Exercises Provided -- -- -- 02/21/21             Exercise Comments:   Exercise Comments     Row Name 01/24/21 1353           Exercise Comments First full day of exercise!  Patient was oriented to gym and equipment including functions, settings, policies, and procedures.  Patient's individual exercise prescription and treatment plan were reviewed.  All starting workloads were established based on the results of the 6 minute walk test done at initial orientation visit.  The plan for exercise progression was also introduced and progression will be customized based on patient's performance and goals.                Exercise Goals and Review:   Exercise Goals     Row Name 01/19/21 1259             Exercise Goals   Increase Physical Activity Yes       Intervention Provide advice, education, support and counseling about physical  activity/exercise needs.;Develop an individualized exercise prescription for aerobic and resistive training based on initial evaluation findings, risk stratification, comorbidities and participant's personal goals.       Expected Outcomes Short Term: Attend rehab on a regular basis to increase amount of physical activity.;Long Term: Add in home exercise to make exercise part of routine and to increase amount of physical activity.;Long Term: Exercising regularly at  least 3-5 days a week.       Increase Strength and Stamina Yes       Intervention Provide advice, education, support and counseling about physical activity/exercise needs.;Develop an individualized exercise prescription for aerobic and resistive training based on initial evaluation findings, risk stratification, comorbidities and participant's personal goals.       Expected Outcomes Short Term: Increase workloads from initial exercise prescription for resistance, speed, and METs.;Short Term: Perform resistance training exercises routinely during rehab and add in resistance training at home;Long Term: Improve cardiorespiratory fitness, muscular endurance and strength as measured by increased METs and functional capacity (6MWT)       Able to understand and use rate of perceived exertion (RPE) scale Yes       Intervention Provide education and explanation on how to use RPE scale       Expected Outcomes Short Term: Able to use RPE daily in rehab to express subjective intensity level;Long Term:  Able to use RPE to guide intensity level when exercising independently       Able to understand and use Dyspnea scale Yes       Intervention Provide education and explanation on how to use Dyspnea scale       Expected Outcomes Short Term: Able to use Dyspnea scale daily in rehab to express subjective sense of shortness of breath during exertion;Long Term: Able to use Dyspnea scale to guide intensity level when exercising independently       Knowledge and  understanding of Target Heart Rate Range (THRR) Yes       Intervention Provide education and explanation of THRR including how the numbers were predicted and where they are located for reference       Expected Outcomes Short Term: Able to state/look up THRR;Long Term: Able to use THRR to govern intensity when exercising independently;Short Term: Able to use daily as guideline for intensity in rehab       Able to check pulse independently Yes       Intervention Provide education and demonstration on how to check pulse in carotid and radial arteries.;Review the importance of being able to check your own pulse for safety during independent exercise       Expected Outcomes Short Term: Able to explain why pulse checking is important during independent exercise;Long Term: Able to check pulse independently and accurately       Understanding of Exercise Prescription Yes       Intervention Provide education, explanation, and written materials on patient's individual exercise prescription       Expected Outcomes Short Term: Able to explain program exercise prescription;Long Term: Able to explain home exercise prescription to exercise independently                Exercise Goals Re-Evaluation :  Exercise Goals Re-Evaluation     [place] Name 01/24/21 1353 01/31/21 1101 02/15/21 1420 02/15/21 1422 02/21/21 1411     Exercise Goal Re-Evaluation   Exercise Goals Review Increase Physical Activity;Able to understand and use rate of perceived exertion (RPE) scale;Knowledge and understanding of Target Heart Rate Range (THRR);Understanding of Exercise Prescription;Able to understand and use Dyspnea scale;Able to check pulse independently;Increase Strength and Stamina Increase Physical Activity;Increase Strength and Stamina Increase Physical Activity;Increase Strength and Stamina -- Increase Physical Activity;Increase Strength and Stamina;Understanding of Exercise Prescription;Able to check pulse independently;Knowledge  and understanding of Target Heart Rate Range (THRR);Able to understand and use Dyspnea scale;Able to understand and use rate of perceived exertion (RPE) scale  Comments Reviewed RPE and dyspnea scales, THR and program prescription with pt today.  Pt voiced understanding and was given a copy of goals to take home. Ahmed is doing well the first couple of sessions that he has been here for rehab. He has followed his exercise prescription thus far and will continue to progress his loads when appropriate. The treadmill is difficult for him as he is limited by his hip pain. Will continue to monitor as he continues to attend rehab. Thedford has been doing only seated machines for the past few sessions. He has increased loads on seated machines. Staff will encourage trying the TM or walking the track again. Trashawn is doing well in rehab.  He is going to add in a third day for rehab.  Reviewed home exercise with pt today.  Pt plans to walk in the house and use staff videos for exercise.  Reviewed THR, pulse, RPE, sign and symptoms, pulse oximetery and when to call 911 or MD.  Also discussed weather considerations and indoor options.  Pt voiced understanding.   Expected Outcomes Short: Use RPE daily to regulate intensity. Long: Follow program prescription in THR. Short: Continue current exercise prescription. Long: Increase overall MET level progressively -- Short: try walking again  Long:  improve overall stamina Short: Start to add in 3rd day Long: continue to improve stamina.    [place] Name 03/03/21 0902             Exercise Goal Re-Evaluation   Exercise Goals Review Increase Physical Activity;Increase Strength and Stamina;Understanding of Exercise Prescription       Comments Tyrees is doing well in rehab.  He is now coming 3 days a week.  He is doing 30 min on his seated equipment.  He does not like to walk do to his balance.       Expected Outcomes Short: Continue to attend regularly Long: Continue to improve  stamina.                Discharge Exercise Prescription (Final Exercise Prescription Changes):  Exercise Prescription Changes - 03/03/21 0900       Response to Exercise   Blood Pressure (Admit) 122/60    Blood Pressure (Exercise) 124/62    Blood Pressure (Exit) 108/60    Heart Rate (Admit) 58 bpm    Heart Rate (Exercise) 93 bpm    Heart Rate (Exit) 61 bpm    Rating of Perceived Exertion (Exercise) 13    Symptoms none    Duration Continue with 30 min of aerobic exercise without signs/symptoms of physical distress.    Intensity THRR unchanged      Progression   Progression Continue to progress workloads to maintain intensity without signs/symptoms of physical distress.    Average METs 2.5      Resistance Training   Training Prescription Yes    Weight 3 lb    Reps 10-15      Interval Training   Interval Training No      REL-XR   Level 2    Minutes 30    METs 3      Biostep-RELP   Level 2    Minutes 30    METs 2      Home Exercise Plan   Plans to continue exercise at Home (comment)   walking, chair exercises   Frequency Add 2 additional days to program exercise sessions.    Initial Home Exercises Provided 02/21/21  Nutrition:  Target Goals: Understanding of nutrition guidelines, daily intake of sodium '1500mg'$ , cholesterol '200mg'$ , calories 30% from fat and 7% or less from saturated fats, daily to have 5 or more servings of fruits and vegetables.  Education: All About Nutrition: -Group instruction provided by verbal, written material, interactive activities, discussions, models, and posters to present general guidelines for heart healthy nutrition including fat, fiber, MyPlate, the role of sodium in heart healthy nutrition, utilization of the nutrition label, and utilization of this knowledge for meal planning. Follow up email sent as well. Written material given at graduation.   Biometrics:  Pre Biometrics - 01/19/21 1245       Pre Biometrics    Height 5' 11.5" (1.816 m)    Weight 189 lb 11.2 oz (86 kg)    BMI (Calculated) 26.09    Single Leg Stand 2.3 seconds              Nutrition Therapy Plan and Nutrition Goals:  Nutrition Therapy & Goals - 01/31/21 1327       Nutrition Therapy   Diet Heart healthy, low Na, T2DM    Drug/Food Interactions Statins/Certain Fruits    Protein (specify units) 70g    Fiber 30 grams    Whole Grain Foods 3 servings    Saturated Fats 12 max. grams    Fruits and Vegetables 8 servings/day    Sodium 1.5 grams      Personal Nutrition Goals   Nutrition Goal ST: eat salads at least 3x/week (Jerric suggested this would be a good option as he enjoys salads), make sure dressing has <5g saturated fat per serving LT: limit going out to eat <2-3x/week, limit saturated fat <12-16g/day, include 5 fruit/vegetable servings per day    Comments 81 y.o. M admitted to cardiac rehab s/p stent placement also presents with T2DM, HLD, HTN, GERD, CAD, NSTEMI (12/20/20). Last A1c 6.6%. PYP: 60. Patient reports that he does not cook a lot; his wife needs two hip replacements. He can stand next to the counter, but not to prepare a full meal - he doesn't normally cook either. B: cereal (frosted flakes, raisin bran, frosted mini wheats, cheerios) - 2% milk. Decaf coffee with splenda. S: biscotti cookie with peanut butter L: sandwich (chicken salad on sourdough today), pimento cheese, roast beef, ham, chicken salad on white wheat. May eat one cookie. S: clementines  D: sometimes another sandwich, take out - chicken usually or fish restaurant (potato), sometimes a chicken salad. S: ice cream (a couple of scoops) Drinks: Sports coach, sometimes a sierra mist. He tries to limit his caffiene. He reports feeling that his diet needs some changes and is willing to do what he can while cooking very little. Discussed with patient that cooking at home can allow for more control over what he is eating - offered to help with meal planning  and cooking strategies to avoid fatigue such as sitting down while chopping vegetables. Discussed heart healthy eating and diabetes friendly eating.      Intervention Plan   Intervention Prescribe, educate and counsel regarding individualized specific dietary modifications aiming towards targeted core components such as weight, hypertension, lipid management, diabetes, heart failure and other comorbidities.    Expected Outcomes Short Term Goal: Understand basic principles of dietary content, such as calories, fat, sodium, cholesterol and nutrients.;Short Term Goal: A plan has been developed with personal nutrition goals set during dietitian appointment.;Long Term Goal: Adherence to prescribed nutrition plan.  Nutrition Assessments:  MEDIFICTS Score Key: ?70 Need to make dietary changes  40-70 Heart Healthy Diet ? 40 Therapeutic Level Cholesterol Diet  Flowsheet Row Cardiac Rehab from 01/19/2021 in [facility] Cardiac and Pulmonary Rehab  Picture Your Plate Total Score on Admission 51      Picture Your Plate Scores: <59 Unhealthy dietary pattern with much room for improvement. 41-50 Dietary pattern unlikely to meet recommendations for good health and room for improvement. 51-60 More healthful dietary pattern, with some room for improvement.  >60 Healthy dietary pattern, although there may be some specific behaviors that could be improved.    Nutrition Goals Re-Evaluation:  Nutrition Goals Re-Evaluation     [place] Name 02/21/21 1415             Goals   Nutrition Goal ST: eat salads at least 3x/week (Laroy suggested this would be a good option as he enjoys salads), make sure dressing has <5g saturated fat per serving LT: limit going out to eat <2-3x/week, limit saturated fat <12-16g/day, include 5 fruit/vegetable servings per day       Comment Maxten is doing well in rehab.  He is trying to eat more salads.  His next step is to get his wife to order more stuff for salad.  He  still likes his ice cream.  He is trying to cut back on it.  He is trying to get in more fruits and vegetables now.       Expected Outcome Short: Order more stuff for salads Long: Continue to improve diet variety                Nutrition Goals Discharge (Final Nutrition Goals Re-Evaluation):  Nutrition Goals Re-Evaluation - 02/21/21 1415       Goals   Nutrition Goal ST: eat salads at least 3x/week (Jakory suggested this would be a good option as he enjoys salads), make sure dressing has <5g saturated fat per serving LT: limit going out to eat <2-3x/week, limit saturated fat <12-16g/day, include 5 fruit/vegetable servings per day    Comment Tori is doing well in rehab.  He is trying to eat more salads.  His next step is to get his wife to order more stuff for salad.  He still likes his ice cream.  He is trying to cut back on it.  He is trying to get in more fruits and vegetables now.    Expected Outcome Short: Order more stuff for salads Long: Continue to improve diet variety             Psychosocial: Target Goals: Acknowledge presence or absence of significant depression and/or stress, maximize coping skills, provide positive support system. Participant is able to verbalize types and ability to use techniques and skills needed for reducing stress and depression.   Education: Stress, Anxiety, and Depression - Group verbal and visual presentation to define topics covered.  Reviews how body is impacted by stress, anxiety, and depression.  Also discusses healthy ways to reduce stress and to treat/manage anxiety and depression.  Written material given at graduation.   Education: Sleep Hygiene -Provides group verbal and written instruction about how sleep can affect your health.  Define sleep hygiene, discuss sleep cycles and impact of sleep habits. Review good sleep hygiene tips.    Initial Review & Psychosocial Screening:  Initial Psych Review & Screening - 12/31/20 1315        Initial Review   Current issues with Current Sleep Concerns  Family Dynamics   Good Support System? Yes   Wife, daughter, neighbors     Barriers   Psychosocial barriers to participate in program There are no identifiable barriers or psychosocial needs.;The patient should benefit from training in stress management and relaxation.      Screening Interventions   Interventions Encouraged to exercise;Provide feedback about the scores to participant;To provide support and resources with identified psychosocial needs    Expected Outcomes Short Term goal: Utilizing psychosocial counselor, staff and physician to assist with identification of specific Stressors or current issues interfering with healing process. Setting desired goal for each stressor or current issue identified.;Long Term Goal: Stressors or current issues are controlled or eliminated.;Short Term goal: Identification and review with participant of any Quality of Life or Depression concerns found by scoring the questionnaire.;Long Term goal: The participant improves quality of Life and PHQ9 Scores as seen by post scores and/or verbalization of changes             Quality of Life Scores:   Quality of Life - 01/19/21 1255       Quality of Life   Select Quality of Life      Quality of Life Scores   Health/Function Pre 14.6 %    Socioeconomic Pre 28 %    Psych/Spiritual Pre 29.64 %    Family Pre 15.6 %    GLOBAL Pre 20.14 %            Scores of 19 and below usually indicate a poorer quality of life in these areas.  A difference of  2-3 points is a clinically meaningful difference.  A difference of 2-3 points in the total score of the Quality of Life Index has been associated with significant improvement in overall quality of life, self-image, physical symptoms, and general health in studies assessing change in quality of life.  PHQ-9: Recent Review Flowsheet Data     Depression screen [facility] 2/9 01/19/2021 10/25/2020  09/10/2020 02/26/2020 09/08/2019   Decreased Interest 0 0 0 0 0   Down, Depressed, Hopeless 0 0 0 0 0   PHQ - 2 Score 0 0 0 0 0   Altered sleeping 0 - - - -   Tired, decreased energy 2 - - - -   Change in appetite 0 - - - -   Feeling bad or failure about yourself  0 - - - -   Trouble concentrating 0 - - - -   Moving slowly or fidgety/restless 0 - - - -   Suicidal thoughts 0 - - - -   PHQ-9 Score 2 - - - -   Difficult doing work/chores Not difficult at all - - - -      Interpretation of Total Score  Total Score Depression Severity:  1-4 = Minimal depression, 5-9 = Mild depression, 10-14 = Moderate depression, 15-19 = Moderately severe depression, 20-27 = Severe depression   Psychosocial Evaluation and Intervention:  Psychosocial Evaluation - 12/31/20 1326       Psychosocial Evaluation & Interventions   Interventions Encouraged to exercise with the program and follow exercise prescription    Comments Mr. 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 His sleep has been altered due to his shortness of breath while lying down. He is in contact with his doctors and sees his hematologist next week to check up on his anemia.  He states the heart issues came as a surprise to him so he  is motivated to work on his health. His friend did the program a while back and is encouraging him to participate as well. His wife and daughter (who moved in with them after their latest health issues) are very supportive. 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 It makes it hard for him to walk and participate in things. He is hopeful this program will help boost his stamina and help create a plan for future exercise and heart healthy living.    Expected Outcomes Short: attend cardiac rehab for education and exercise. Long: develop and maintain positive self care habits.    Continue Psychosocial Services  Follow up required by staff              Psychosocial Re-Evaluation:  Psychosocial Re-Evaluation     Row Name 02/21/21 1412             Psychosocial Re-Evaluation   Current issues with Current Stress Concerns;Current Sleep Concerns       Comments Tarance is doing well in rehab.  He does not have any major stressor other than his health.  He gets frustrated by not being able to do what he wants to do. He has a nerve stimulator for his back which aggravates him along with his legs.  He has a hard time differeniating  between his pain and nueropathy.  He is sleeping better now, but still wakes frequently to go bathroom.  His surgery has made his bladder erratic which disrupts his sleep. His wife needs hip replacement and he tries to help her.  His daughter has been living with them as she recovers from rotator cuff surgery.       Expected Outcomes Short: Continue to move as much as possible Long: Continue to focus on the positive       Interventions Stress management education;Encouraged to attend Cardiac Rehabilitation for the exercise       Continue Psychosocial Services  Follow up required by staff                Psychosocial Discharge (Final Psychosocial Re-Evaluation):  Psychosocial Re-Evaluation - 02/21/21 1412       Psychosocial Re-Evaluation   Current issues with Current Stress Concerns;Current Sleep Concerns    Comments Tryce is doing well in rehab.  He does not have any major stressor other than his health.  He gets frustrated by not being able to do what he wants to do. He has a nerve stimulator for his back which aggravates him along with his legs.  He has a hard time differeniating  between his pain and nueropathy.  He is sleeping better now, but still wakes frequently to go bathroom.  His surgery has made his bladder erratic which disrupts his sleep. His wife needs hip replacement and he tries to help her.  His daughter has been living with them as she recovers from rotator cuff surgery.    Expected Outcomes  Short: Continue to move as much as possible Long: Continue to focus on the positive    Interventions Stress management education;Encouraged to attend Cardiac Rehabilitation for the exercise    Continue Psychosocial Services  Follow up required by staff             Vocational Rehabilitation: Provide vocational rehab assistance to qualifying candidates.   Vocational Rehab Evaluation & Intervention:  Vocational Rehab - 12/31/20 1312       Initial Vocational Rehab Evaluation & Intervention  Assessment shows need for Vocational Rehabilitation No             Education: Education Goals: Education classes will be provided on a variety of topics geared toward better understanding of heart health and risk factor modification. Participant will state understanding/return demonstration of topics presented as noted by education test scores.  Learning Barriers/Preferences:  Learning Barriers/Preferences - 12/31/20 1312       Learning Barriers/Preferences   Learning Barriers None    Learning Preferences None             General Cardiac Education Topics:  AED/CPR: - Group verbal and written instruction with the use of models to demonstrate the basic use of the AED with the basic ABC's of resuscitation.   Anatomy and Cardiac Procedures: - Group verbal and visual presentation and models provide information about basic cardiac anatomy and function. Reviews the testing methods done to diagnose heart disease and the outcomes of the test results. Describes the treatment choices: Medical Management, Angioplasty, or Coronary Bypass Surgery for treating various heart conditions including Myocardial Infarction, Angina, Valve Disease, and Cardiac Arrhythmias.  Written material given at graduation. Flowsheet Row Cardiac Rehab from 03/02/2021 in [facility] Cardiac and Pulmonary Rehab  Date 02/16/21  Educator SB  Instruction Review Code 1- Verbalizes Understanding       Medication Safety: -  Group verbal and visual instruction to review commonly prescribed medications for heart and lung disease. Reviews the medication, class of the drug, and side effects. Includes the steps to properly store meds and maintain the prescription regimen.  Written material given at graduation. Flowsheet Row Cardiac Rehab from 03/02/2021 in [facility] Cardiac and Pulmonary Rehab  Date 03/02/21  Educator [facility]  Instruction Review Code 1- Verbalizes Understanding       Intimacy: - Group verbal instruction through game format to discuss how heart and lung disease can affect sexual intimacy. Written material given at graduation.. Flowsheet Row Cardiac Rehab from 03/02/2021 in [facility] Cardiac and Pulmonary Rehab  Date 02/09/21  Educator AS  Instruction Review Code 1- Verbalizes Understanding       Know Your Numbers and Heart Failure: - Group verbal and visual instruction to discuss disease risk factors for cardiac and pulmonary disease and treatment options.  Reviews associated critical values for Overweight/Obesity, Hypertension, Cholesterol, and Diabetes.  Discusses basics of heart failure: signs/symptoms and treatments.  Introduces Heart Failure Zone chart for action plan for heart failure.  Written material given at graduation. Flowsheet Row Cardiac Rehab from 03/02/2021 in [facility] Cardiac and Pulmonary Rehab  Education need identified 01/19/21       Infection Prevention: - Provides verbal and written material to individual with discussion of infection control including proper hand washing and proper equipment cleaning during exercise session. Flowsheet Row Cardiac Rehab from 03/02/2021 in [facility] Cardiac and Pulmonary Rehab  Education need identified 01/19/21  Date 01/19/21  Educator [place]  Instruction Review Code 1- Verbalizes Understanding       Falls Prevention: - Provides verbal and written material to individual with discussion of falls prevention and safety. Flowsheet Row Cardiac Rehab from 03/02/2021 in  [facility] Cardiac and Pulmonary Rehab  Education need identified 01/19/21  Date 01/19/21  Educator [place]  Instruction Review Code 1- Verbalizes Understanding       Other: -Provides group and verbal instruction on various topics (see comments)   Knowledge Questionnaire Score:  Knowledge Questionnaire Score - 01/19/21 1244       Knowledge Questionnaire Score   Pre Score 22/26:  MI, Nitro, Exercise, A&P             Core Components/Risk Factors/Patient Goals at Admission:  Personal Goals and Risk Factors at Admission - 01/19/21 1300       Core Components/Risk Factors/Patient Goals on Admission    Weight Management Yes;Weight Maintenance    Intervention Weight Management: Develop a combined nutrition and exercise program designed to reach desired caloric intake, while maintaining appropriate intake of nutrient and fiber, sodium and fats, and appropriate energy expenditure required for the weight goal.;Weight Management: Provide education and appropriate resources to help participant work on and attain dietary goals.;Weight Management/Obesity: Establish reasonable short term and long term weight goals.    Admit Weight 189 lb (85.7 kg)    Goal Weight: Short Term 189 lb (85.7 kg)    Goal Weight: Long Term 189 lb (85.7 kg)    Expected Outcomes Short Term: Continue to assess and modify interventions until short term weight is achieved;Long Term: Adherence to nutrition and physical activity/exercise program aimed toward attainment of established weight goal;Weight Maintenance: Understanding of the daily nutrition guidelines, which includes 25-35% calories from fat, 7% or less cal from saturated fats, less than $RemoveB'200mg'TcsstjYy$  cholesterol, less than 1.5gm of sodium, & 5 or more servings of fruits and vegetables daily;Understanding recommendations for meals to include 15-35% energy as protein, 25-35% energy from fat, 35-60% energy from carbohydrates, less than $RemoveB'200mg'XqfFsYPK$  of dietary cholesterol, 20-35 gm of total  fiber daily;Understanding of distribution of calorie intake throughout the day with the consumption of 4-5 meals/snacks    Diabetes Yes    Intervention Provide education about signs/symptoms and action to take for hypo/hyperglycemia.;Provide education about proper nutrition, including hydration, and aerobic/resistive exercise prescription along with prescribed medications to achieve blood glucose in normal ranges: Fasting glucose 65-99 mg/dL    Expected Outcomes Short Term: Participant verbalizes understanding of the signs/symptoms and immediate care of hyper/hypoglycemia, proper foot care and importance of medication, aerobic/resistive exercise and nutrition plan for blood glucose control.;Long Term: Attainment of HbA1C < 7%.    Hypertension Yes    Intervention Provide education on lifestyle modifcations including regular physical activity/exercise, weight management, moderate sodium restriction and increased consumption of fresh fruit, vegetables, and low fat dairy, alcohol moderation, and smoking cessation.;Monitor prescription use compliance.    Expected Outcomes Short Term: Continued assessment and intervention until BP is < 140/74mm HG in hypertensive participants. < 130/71mm HG in hypertensive participants with diabetes, heart failure or chronic kidney disease.;Long Term: Maintenance of blood pressure at goal levels.    Lipids Yes    Intervention Provide education and support for participant on nutrition & aerobic/resistive exercise along with prescribed medications to achieve LDL '70mg'$ , HDL >$Remo'40mg'ElQKH$ .    Expected Outcomes Short Term: Participant states understanding of desired cholesterol values and is compliant with medications prescribed. Participant is following exercise prescription and nutrition guidelines.;Long Term: Cholesterol controlled with medications as prescribed, with individualized exercise RX and with personalized nutrition plan. Value goals: LDL < $Rem'70mg'CmPs$ , HDL > 40 mg.              Education:Diabetes - Individual verbal and written instruction to review signs/symptoms of diabetes, desired ranges of glucose level fasting, after meals and with exercise. Acknowledge that pre and post exercise glucose checks will be done for 3 sessions at entry of program. [place] from 03/02/2021 in [facility] Cardiac and Pulmonary Rehab  Education need identified 01/19/21  Date 01/19/21  Educator [place]  Instruction Review Code 1- Verbalizes Understanding  Core Components/Risk Factors/Patient Goals Review:   Goals and Risk Factor Review     Row Name 02/21/21 1417             Core Components/Risk Factors/Patient Goals Review   Personal Goals Review Weight Management/Obesity;Hypertension;Diabetes;Lipids       Review Montae is doing well in rehab. His weight has been holding steady.  He feels good here.  His sugars are doing well and he checks it about 3x a week and averages between 120-130 mg/dl.  His pressures have been up and down throughout the day.  He has found that early in the morning he runs high  but comes back down after breakfast and some moving.  He takes his meds at night.  He is only taking metformin in the morning and feeling good.       Expected Outcomes Short: Continue to keep close eye on blood pressures Long: continue to monitor risk factors.                Core Components/Risk Factors/Patient Goals at Discharge (Final Review):   Goals and Risk Factor Review - 02/21/21 1417       Core Components/Risk Factors/Patient Goals Review   Personal Goals Review Weight Management/Obesity;Hypertension;Diabetes;Lipids    Review Caeleb is doing well in rehab. His weight has been holding steady.  He feels good here.  His sugars are doing well and he checks it about 3x a week and averages between 120-130 mg/dl.  His pressures have been up and down throughout the day.  He has found that early in the morning he runs high  but comes back down after breakfast  and some moving.  He takes his meds at night.  He is only taking metformin in the morning and feeling good.    Expected Outcomes Short: Continue to keep close eye on blood pressures Long: continue to monitor risk factors.             ITP Comments:  ITP Comments     Row Name 12/31/20 1303 01/19/21 1309 01/24/21 1353 01/31/21 1432 02/09/21 0840   ITP Comments Initial telephone orientation completed. Diagnosis can be found in [facility] 11/7. EP orientation scheduled for Wednesday 12/7 at 11am. Completed 6MWT and gym orientation. Initial ITP created and sent for review to Dr. Emily Filbert, Medical Director. First full day of exercise!  Patient was oriented to gym and equipment including functions, settings, policies, and procedures.  Patient's individual exercise prescription and treatment plan were reviewed.  All starting workloads were established based on the results of the 6 minute walk test done at initial orientation visit.  The plan for exercise progression was also introduced and progression will be customized based on patient's performance and goals. Completed initial RD Consultation 30 Day review completed. Medical Director ITP review done, changes made as directed, and signed approval by Medical Director.    [place] Name 03/09/21 0921           ITP Comments 30 Day review completed. Medical Director ITP review done, changes made as directed, and signed approval by Medical Director.                Comments:

## 2021-03-09 NOTE — Progress Notes (Signed)
[place] NOTE  Patient Care Team: [name], NP as PCP - General (Nurse Practitioner) [name], MD as PCP - Family Medicine (Family Medicine) Corey Skains, MD as Consulting Physician (Internal Medicine) Ralene Bathe, MD (Dermatology) Kary Kos, MD as Consulting Physician (Neurosurgery) Hooten, Laurice Record, MD (Orthopedic Surgery) Marlaine Hind, MD as Consulting Physician (Physical Medicine and Rehabilitation) Christy Sartorius, MD as Referring Physician (Urology) Vanita Ingles, RN as Case Manager (General Practice)  CHIEF COMPLAINTS/PURPOSE OF CONSULTATION: ANEMIA   HEMATOLOGY HISTORY: ;  #Chronic anemia hemoglobin 10-11; October 22 -iron saturation 17% ferritin 90s./? CKD-S/p Venofer x4; Jan 2023- Hb 12.3;  EGD/Colonoscopy: Sep, 2022- Dr.Anna  # OCT 2022-non-STEMI- [s/p stenting]aspirin Brilinta; paroxysmal A. fib on Eliquis [Dr. Paraschoes];CKD stage III; chronic back pain-s/p back surgeries s/p spinal cord-pain stimulator; diabetes on oral medication; 2022-C. difficile colitis-s/p oral vancomycin.   HISTORY OF PRESENTING ILLNESS: Ambulating cane/crutches; alone.  Sean Reyes. 81 y.o.  male iron deficient anemia/?  CKD stage III is here for follow-up.   Patient also had some improvement of his energy levels post iron infusions.  He is currently at his baseline.  He is currently not taking iron pills.  Review of Systems  Constitutional:  Positive for malaise/fatigue. Negative for chills, diaphoresis, fever and weight loss.  HENT:  Negative for nosebleeds and sore throat.   Eyes:  Negative for double vision.  Respiratory:  Positive for shortness of breath. Negative for cough, hemoptysis, sputum production and wheezing.   Cardiovascular:  Negative for chest pain, palpitations, orthopnea and leg swelling.  Gastrointestinal:  Negative for abdominal pain, blood in stool, constipation, diarrhea, heartburn, melena, nausea and vomiting.   Genitourinary:  Negative for dysuria, frequency and urgency.  Musculoskeletal:  Positive for back pain and joint pain.  Skin: Negative.  Negative for itching and rash.  Neurological:  Negative for dizziness, tingling, focal weakness, weakness and headaches.  Endo/Heme/Allergies:  Does not bruise/bleed easily.  Psychiatric/Behavioral:  Negative for depression. The patient is not nervous/anxious and does not have insomnia.    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   Hyperlipidemia    Hypertension    Myocardial infarction ([place]) 12/2020   Nocturia    S/P ablation of atrial fibrillation    Ablative therapy   Sleep apnea    CPAP    Spinal stenosis    Tachycardia, unspecified     SURGICAL HISTORY: Past Surgical History:  Procedure Laterality Date   ABLATION     ANTERIOR LAT LUMBAR FUSION N/A 06/27/2017   Procedure: Anterior Lateral Lumbar Interbody  Fusion - Lumbar Two-Lumbar Three - Lumbar Three-Lumbar Four, Posterior Lumbar Interbody Fusion Lumbar Four- Five;  Surgeon: Kary Kos, MD;  Location: [place];  Service: Neurosurgery;  Laterality: N/A;  Anterior Lateral Lumbar Interbody  Fusion - Lumbar Two-Lumbar Three -  Lumbar Three-Lumbar Four, Posterior Lumbar Interbody Fusion Lumbar Four- Five   BACK SURGERY     CARDIOVERSION N/A 08/29/2018   Procedure: CARDIOVERSION;  Surgeon: Corey Skains, MD;  Location: [facility];  Service: Cardiovascular;  Laterality: N/A;   CARDIOVERSION N/A 09/24/2018   Procedure:  CARDIOVERSION;  Surgeon: Corey Skains, MD;  Location: [facility];  Service: Cardiovascular;  Laterality: N/A;   COLONOSCOPY WITH PROPOFOL N/A 10/05/2015   Procedure: COLONOSCOPY WITH PROPOFOL;  Surgeon: Lollie Sails, MD;  Location: [facility] ENDOSCOPY;  Service: Endoscopy;  Laterality: N/A;   COLONOSCOPY WITH PROPOFOL N/A 11/01/2020   Procedure: COLONOSCOPY WITH PROPOFOL;  Surgeon: Jonathon Bellows, MD;  Location: [facility] ENDOSCOPY;  Service: Gastroenterology;  Laterality: N/A;   CORONARY STENT INTERVENTION N/A 12/22/2020   Procedure: CORONARY STENT INTERVENTION;  Surgeon: [name], MD;  Location: [place] CV LAB;  Service: Cardiovascular;  Laterality: N/A;   ESOPHAGOGASTRODUODENOSCOPY N/A 11/01/2020   Procedure: ESOPHAGOGASTRODUODENOSCOPY (EGD);  Surgeon: Jonathon Bellows, MD;  Location: [facility] ENDOSCOPY;  Service: Gastroenterology;  Laterality: N/A;   ESOPHAGOGASTRODUODENOSCOPY (EGD) WITH PROPOFOL N/A 04/01/2018   Procedure: ESOPHAGOGASTRODUODENOSCOPY (EGD) WITH PROPOFOL;  Surgeon: Lollie Sails, MD;  Location: [facility] ENDOSCOPY;  Service: Endoscopy;  Laterality: N/A;   HERNIA REPAIR     JOINT REPLACEMENT Bilateral    hips  RT+  LEFT X2    LEFT HEART CATH AND CORONARY ANGIOGRAPHY N/A 12/22/2020   Procedure: LEFT HEART CATH AND CORONARY ANGIOGRAPHY;  Surgeon: [name], MD;  Location: [place] CV LAB;  Service: Cardiovascular;  Laterality: N/A;   LUMBAR LAMINECTOMY/DECOMPRESSION MICRODISCECTOMY Left 09/13/2016   Procedure: Microdiscectomy - Lumbar two-three,  Lumbar three- - left;  Surgeon: Kary Kos, MD;  Location: [place];  Service: Neurosurgery;  Laterality: Left;   SPINAL CORD STIMULATOR INSERTION  07/08/2019   TONSILLECTOMY      SOCIAL HISTORY: Social History   Socioeconomic History   Marital status: Married    Spouse name: Malachy Mood    Number of children: 2   Years of education: Not on file   Highest education level: High school graduate  Occupational History    Occupation: retired   Tobacco Use   Smoking status: Never   Smokeless tobacco: Never  Vaping Use   Vaping Use: Never used  Substance and Sexual Activity   Alcohol use: No   Drug use: No   Sexual activity: Not on file  Other Topics Concern   Not on file  Social History Narrative   MarriedGets regular exercise.      Lives in [place] with wife/ daughter. Never smoked; no alcohol. 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    Social Determinants of Health   Financial Resource Strain: Low Risk    Difficulty of Paying Living Expenses: Not hard at all  Food Insecurity: No Food Insecurity   Worried About Charity fundraiser in the Last Year: Never true   [place] in the Last Year: Never true  Transportation Needs: No Transportation Needs   Lack of Transportation (Medical): No   Lack of Transportation (Non-Medical): No  Physical Activity: Inactive   Days of Exercise per Week: 0 days   Minutes of Exercise per Session: 0 min  Stress: No Stress Concern Present   Feeling of Stress : Not at all  Social Connections: Moderately Integrated   Frequency of Communication with Friends and Family: More than three times a week   Frequency of Social Gatherings with Friends and Family:  More than three times a week   Attends Religious Services: More than 4 times per year   Active Member of Clubs or Organizations: No   Attends Archivist Meetings: Never   Marital Status: Married  Human resources officer Violence: Not At Risk   Fear of Current or Ex-Partner: No   Emotionally Abused: No   Physically Abused: No   Sexually Abused: No    FAMILY HISTORY: Family History  Problem Relation Age of Onset   Brain cancer Mother    Kidney disease Neg Hx    Prostate cancer Neg Hx    Kidney cancer Neg Hx    Bladder Cancer Neg Hx     ALLERGIES:  is allergic to levaquin [levofloxacin in d5w], shellfish allergy, amiodarone, and adhesive  [tape].  MEDICATIONS:  Current Outpatient Medications  Medication Sig Dispense Refill   acetaminophen (TYLENOL) 500 MG tablet Take 500 mg by mouth every 6 (six) hours as needed (pain).     cyanocobalamin 1000 MCG tablet Take by mouth.     ELIQUIS 5 MG TABS tablet Take 1 tablet (5 mg total) by mouth 2 (two) times daily. 180 tablet 4   finasteride (PROSCAR) 5 MG tablet Take 1 tablet (5 mg total) by mouth daily. 90 tablet 2   isosorbide mononitrate (IMDUR) 30 MG 24 hr tablet Take 30 mg by mouth daily.     metFORMIN (GLUCOPHAGE) 500 MG tablet Take by mouth 2 (two) times daily with a meal.     metoprolol succinate (TOPROL-XL) 25 MG 24 hr tablet Take 1 tablet by mouth daily.     omeprazole (PRILOSEC) 10 MG capsule Take 10 mg by mouth daily.     propafenone (RYTHMOL SR) 425 MG 12 hr capsule Take 425 mg by mouth 2 (two) times daily. Has been out for 3 days     rosuvastatin (CRESTOR) 5 MG tablet Take 1 tablet (5 mg total) by mouth daily. 90 tablet 2   tamsulosin (FLOMAX) 0.4 MG CAPS capsule Take 0.8 mg by mouth daily.     No current facility-administered medications for this visit.      PHYSICAL EXAMINATION:   Vitals:   03/09/21 1312  BP: 110/66  Pulse: (!) 52  Temp: 97.9 F (36.6 C)  SpO2: 98%   Filed Weights   03/09/21 1312  Weight: 191 lb 1.6 oz (86.7 kg)    Physical Exam Vitals and nursing note reviewed.  HENT:     Head: Normocephalic and atraumatic.     Mouth/Throat:     Pharynx: Oropharynx is clear.  Eyes:     Extraocular Movements: Extraocular movements intact.     Pupils: Pupils are equal, round, and reactive to light.  Cardiovascular:     Rate and Rhythm: Normal rate and regular rhythm.  Pulmonary:     Comments: Decreased breath sounds bilaterally.  Abdominal:     Palpations: Abdomen is soft.  Musculoskeletal:        General: Normal range of motion.     Cervical back: Normal range of motion.  Skin:    General: Skin is warm.  Neurological:     General: No  focal deficit present.     Mental Status: He is alert and oriented to person, place, and time.  Psychiatric:        Behavior: Behavior normal.        Judgment: Judgment normal.    LABORATORY DATA:  I have reviewed the data as listed Lab Results  Component Value Date  WBC 5.3 03/09/2021   HGB 12.2 (L) 03/09/2021   HCT 35.7 (L) 03/09/2021   MCV 92.7 03/09/2021   PLT 148 (L) 03/09/2021   Recent Labs    06/10/20 0945 09/09/20 1133 12/22/20 0410 12/23/20 0417 12/31/20 1633 03/09/21 1240  NA 137   < > 136 135 137 135  K 4.5   < > 3.8 3.5 4.2 4.5  CL 98   < > 102 105 100 100  CO2 24   < > 26 25 24 28   GLUCOSE 198*   < > 132* 126* 160* 188*  BUN 20   < > 19 16 23 23   CREATININE 1.29*   < > 1.28* 1.17 1.28* 1.43*  CALCIUM 9.6   < > 8.8* 8.4* 9.4 9.4  GFRNONAA  --    < > 57* >60  --  50*  PROT 6.7  --   --   --  6.7 7.3  ALBUMIN 4.1  --   --   --  4.3 3.9  AST 16  --   --   --  47* 28  ALT 19  --   --   --  59* 24  ALKPHOS 69  --   --   --  75 62  BILITOT 0.4  --   --   --  0.4 0.3   < > = values in this interval not displayed.     No results found.  Symptomatic anemia Mild to moderate symptomatic anemia Hb 11; ferritin-90; iron sat- 18%.Santina Evans CKD-III. S/p IV venofer weekly x4; January 2023 -Hb- 12.2; on slow release PO iron. HOLD IV venofer.   # Thrombocytopenia mild 148 platelets-monitor for now.  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   # CAD [sp stenting OCT 2022; Dr.Kowalski]; on Eliquis/antiplatelet therapy.  # DISPOSITION:  # HOLD venofer- # in 3 months- MD; labs- cbc/cmp; iron studies/feritin-;possible venofer- Dr.B      All questions were answered. The patient knows to call the clinic with any problems, questions or concerns.      Cammie Sickle, MD 03/09/2021 1:43 PM

## 2021-03-10 LAB — KAPPA/LAMBDA LIGHT CHAINS
Kappa free light chain: 49.4 mg/L — ABNORMAL HIGH (ref 3.3–19.4)
Kappa, lambda light chain ratio: 1.44 (ref 0.26–1.65)
Lambda free light chains: 34.4 mg/L — ABNORMAL HIGH (ref 5.7–26.3)

## 2021-03-10 LAB — HAPTOGLOBIN: Haptoglobin: 196 mg/dL (ref 34–355)

## 2021-03-11 ENCOUNTER — Other Ambulatory Visit: Payer: Self-pay

## 2021-03-11 ENCOUNTER — Encounter: Payer: Medicare Other | Admitting: *Deleted

## 2021-03-11 DIAGNOSIS — Z955 Presence of coronary angioplasty implant and graft: Secondary | ICD-10-CM

## 2021-03-11 DIAGNOSIS — Z5189 Encounter for other specified aftercare: Secondary | ICD-10-CM | POA: Diagnosis not present

## 2021-03-11 DIAGNOSIS — I252 Old myocardial infarction: Secondary | ICD-10-CM | POA: Diagnosis not present

## 2021-03-11 DIAGNOSIS — I214 Non-ST elevation (NSTEMI) myocardial infarction: Secondary | ICD-10-CM

## 2021-03-11 NOTE — Progress Notes (Signed)
Daily Session Note  Patient Details  Name: Sean Reyes. MRN: [id number] Date of Birth: 1940-12-15 Referring Provider:   Flowsheet Row Cardiac Rehab from 01/19/2021 in [facility] Cardiac and Pulmonary Rehab  Referring Provider [name] MD       Encounter Date: 03/11/2021  Check In:  Session Check In - 03/11/21 1108       Check-In   Supervising physician immediately available to respond to emergencies See telemetry face sheet for immediately available ER MD    Location [facility]-Cardiac & Pulmonary Rehab    Staff Present Renita Papa, RN BSN;Joseph Tessie Fass, RCP,RRT,BSRT;Melissa [place], [place], LDN    Virtual Visit No    Medication changes reported     No    Fall or balance concerns reported    No    Warm-up and Cool-down Performed on first and last piece of equipment    Resistance Training Performed Yes    VAD Patient? No    PAD/SET Patient? No      Pain Assessment   Currently in Pain? No/denies                Social History   Tobacco Use  Smoking Status Never  Smokeless Tobacco Never    Goals Met:  Independence with exercise equipment Exercise tolerated well No report of concerns or symptoms today Strength training completed today  Goals Unmet:  Not Applicable  Comments: Pt able to follow exercise prescription today without complaint.  Will continue to monitor for progression.    Dr. Emily Filbert is Medical Director for [place].  Dr. Ottie Glazier is Medical Director for [facility] Pulmonary Rehabilitation.

## 2021-03-14 ENCOUNTER — Other Ambulatory Visit: Payer: Self-pay

## 2021-03-14 DIAGNOSIS — Z955 Presence of coronary angioplasty implant and graft: Secondary | ICD-10-CM | POA: Diagnosis not present

## 2021-03-14 DIAGNOSIS — I214 Non-ST elevation (NSTEMI) myocardial infarction: Secondary | ICD-10-CM

## 2021-03-14 DIAGNOSIS — Z5189 Encounter for other specified aftercare: Secondary | ICD-10-CM | POA: Diagnosis not present

## 2021-03-14 DIAGNOSIS — I252 Old myocardial infarction: Secondary | ICD-10-CM | POA: Diagnosis not present

## 2021-03-14 LAB — MULTIPLE MYELOMA PANEL, SERUM
Albumin SerPl Elph-Mcnc: 3.7 g/dL (ref 2.9–4.4)
Albumin/Glob SerPl: 1.2 (ref 0.7–1.7)
Alpha 1: 0.3 g/dL (ref 0.0–0.4)
Alpha2 Glob SerPl Elph-Mcnc: 0.8 g/dL (ref 0.4–1.0)
B-Globulin SerPl Elph-Mcnc: 0.9 g/dL (ref 0.7–1.3)
Gamma Glob SerPl Elph-Mcnc: 1 g/dL (ref 0.4–1.8)
Globulin, Total: 3.1 g/dL (ref 2.2–3.9)
IgA: 286 mg/dL (ref 61–437)
IgG (Immunoglobin G), Serum: 1027 mg/dL (ref 603–1613)
IgM (Immunoglobulin M), Srm: 113 mg/dL (ref 15–143)
Total Protein ELP: 6.8 g/dL (ref 6.0–8.5)

## 2021-03-14 NOTE — Progress Notes (Signed)
Daily Session Note  Patient Details  Name: Sean Reyes. MRN: [id number] Date of Birth: 14-Jul-1940 Referring Provider:   Flowsheet Row Cardiac Rehab from 01/19/2021 in [facility] Cardiac and Pulmonary Rehab  Referring Provider [name] MD       Encounter Date: 03/14/2021  Check In:  Session Check In - 03/14/21 1403       Check-In   Supervising physician immediately available to respond to emergencies See telemetry face sheet for immediately available ER MD    Location [facility]-Cardiac & Pulmonary Rehab    Staff Present Birdie Sons, MPA, Nino Glow, MS, ASCM CEP, Exercise Physiologist;Joseph Tessie Fass, [place]    Virtual Visit No    Medication changes reported     No    Fall or balance concerns reported    No    Warm-up and Cool-down Performed on first and last piece of equipment    Resistance Training Performed Yes    VAD Patient? No    PAD/SET Patient? No      Pain Assessment   Currently in Pain? No/denies                Social History   Tobacco Use  Smoking Status Never  Smokeless Tobacco Never    Goals Met:  Independence with exercise equipment Exercise tolerated well No report of concerns or symptoms today Strength training completed today  Goals Unmet:  Not Applicable  Comments: Pt able to follow exercise prescription today without complaint.  Will continue to monitor for progression.    Dr. Emily Filbert is Medical Director for [place].  Dr. Ottie Glazier is Medical Director for [facility] Pulmonary Rehabilitation.

## 2021-03-18 ENCOUNTER — Other Ambulatory Visit: Payer: Self-pay

## 2021-03-18 ENCOUNTER — Encounter: Payer: Medicare Other | Attending: Cardiology | Admitting: *Deleted

## 2021-03-18 DIAGNOSIS — I252 Old myocardial infarction: Secondary | ICD-10-CM | POA: Insufficient documentation

## 2021-03-18 DIAGNOSIS — Z955 Presence of coronary angioplasty implant and graft: Secondary | ICD-10-CM | POA: Insufficient documentation

## 2021-03-18 DIAGNOSIS — I214 Non-ST elevation (NSTEMI) myocardial infarction: Secondary | ICD-10-CM

## 2021-03-18 NOTE — Progress Notes (Signed)
Daily Session Note  Patient Details  Name: Sean Reyes. MRN: [id number] Date of Birth: 03-19-1940 Referring Provider:   Flowsheet Row Cardiac Rehab from 01/19/2021 in [facility] Cardiac and Pulmonary Rehab  Referring Provider [name] MD       Encounter Date: 03/18/2021  Check In:  Session Check In - 03/18/21 1057       Check-In   Supervising physician immediately available to respond to emergencies See telemetry face sheet for immediately available ER MD    Location [facility]-Cardiac & Pulmonary Rehab    Staff Present Renita Papa, RN BSN;Joseph [place], RCP,RRT,BSRT;Jessica [place], [place], RCEP, CCRP, CCET    Virtual Visit No    Medication changes reported     No    Fall or balance concerns reported    No    Warm-up and Cool-down Performed on first and last piece of equipment    Resistance Training Performed Yes    VAD Patient? No    PAD/SET Patient? No      Pain Assessment   Currently in Pain? No/denies                Social History   Tobacco Use  Smoking Status Never  Smokeless Tobacco Never    Goals Met:  Independence with exercise equipment Exercise tolerated well No report of concerns or symptoms today Strength training completed today  Goals Unmet:  Not Applicable  Comments: Pt able to follow exercise prescription today without complaint.  Will continue to monitor for progression.    Dr. Emily Filbert is Medical Director for [place].  Dr. Ottie Glazier is Medical Director for [facility] Pulmonary Rehabilitation.

## 2021-03-21 DIAGNOSIS — M5416 Radiculopathy, lumbar region: Secondary | ICD-10-CM | POA: Diagnosis not present

## 2021-03-21 DIAGNOSIS — G8929 Other chronic pain: Secondary | ICD-10-CM | POA: Diagnosis not present

## 2021-03-23 DIAGNOSIS — E1169 Type 2 diabetes mellitus with other specified complication: Secondary | ICD-10-CM | POA: Diagnosis not present

## 2021-03-23 DIAGNOSIS — I10 Essential (primary) hypertension: Secondary | ICD-10-CM | POA: Diagnosis not present

## 2021-03-23 DIAGNOSIS — E785 Hyperlipidemia, unspecified: Secondary | ICD-10-CM | POA: Diagnosis not present

## 2021-03-23 DIAGNOSIS — I480 Paroxysmal atrial fibrillation: Secondary | ICD-10-CM | POA: Diagnosis not present

## 2021-03-23 DIAGNOSIS — G4733 Obstructive sleep apnea (adult) (pediatric): Secondary | ICD-10-CM | POA: Diagnosis not present

## 2021-03-23 DIAGNOSIS — I208 Other forms of angina pectoris: Secondary | ICD-10-CM | POA: Diagnosis not present

## 2021-03-23 DIAGNOSIS — N1831 Chronic kidney disease, stage 3a: Secondary | ICD-10-CM | POA: Diagnosis not present

## 2021-03-23 DIAGNOSIS — I25118 Atherosclerotic heart disease of native coronary artery with other forms of angina pectoris: Secondary | ICD-10-CM | POA: Diagnosis not present

## 2021-03-25 ENCOUNTER — Other Ambulatory Visit: Payer: Self-pay

## 2021-03-25 ENCOUNTER — Encounter: Payer: Medicare Other | Admitting: *Deleted

## 2021-03-25 DIAGNOSIS — I214 Non-ST elevation (NSTEMI) myocardial infarction: Secondary | ICD-10-CM

## 2021-03-25 DIAGNOSIS — Z955 Presence of coronary angioplasty implant and graft: Secondary | ICD-10-CM

## 2021-03-25 DIAGNOSIS — I252 Old myocardial infarction: Secondary | ICD-10-CM | POA: Diagnosis not present

## 2021-03-25 NOTE — Progress Notes (Signed)
Daily Session Note  Patient Details  Name: Sean Reyes. MRN: [id number] Date of Birth: Sep 21, 1940 Referring Provider:   Flowsheet Row Cardiac Rehab from 01/19/2021 in [facility] Cardiac and Pulmonary Rehab  Referring Provider [name] MD       Encounter Date: 03/25/2021  Check In:  Session Check In - 03/25/21 1103       Check-In   Supervising physician immediately available to respond to emergencies See telemetry face sheet for immediately available ER MD    Location [facility]-Cardiac & Pulmonary Rehab    Staff Present Renita Papa, RN BSN;Joseph [place], RCP,RRT,BSRT;Jessica [place], [place], RCEP, CCRP, CCET    Virtual Visit No    Medication changes reported     No    Fall or balance concerns reported    No    Warm-up and Cool-down Performed on first and last piece of equipment    Resistance Training Performed Yes    VAD Patient? No    PAD/SET Patient? No      Pain Assessment   Currently in Pain? No/denies                Social History   Tobacco Use  Smoking Status Never  Smokeless Tobacco Never    Goals Met:  Independence with exercise equipment Exercise tolerated well No report of concerns or symptoms today Strength training completed today  Goals Unmet:  Not Applicable  Comments: Pt able to follow exercise prescription today without complaint.  Will continue to monitor for progression.    Dr. Emily Filbert is Medical Director for [place].  Dr. Ottie Glazier is Medical Director for [facility] Pulmonary Rehabilitation.

## 2021-03-28 ENCOUNTER — Other Ambulatory Visit: Payer: Self-pay

## 2021-03-28 DIAGNOSIS — I214 Non-ST elevation (NSTEMI) myocardial infarction: Secondary | ICD-10-CM

## 2021-03-28 DIAGNOSIS — I252 Old myocardial infarction: Secondary | ICD-10-CM | POA: Diagnosis not present

## 2021-03-28 DIAGNOSIS — Z955 Presence of coronary angioplasty implant and graft: Secondary | ICD-10-CM

## 2021-03-28 NOTE — Progress Notes (Signed)
Daily Session Note  Patient Details  Name: Sean Reyes. MRN: [id number] Date of Birth: November 27, 1940 Referring Provider:   Flowsheet Row Cardiac Rehab from 01/19/2021 in [facility] Cardiac and Pulmonary Rehab  Referring Provider [name] MD       Encounter Date: 03/28/2021  Check In:  Session Check In - 03/28/21 1402       Check-In   Supervising physician immediately available to respond to emergencies See telemetry face sheet for immediately available ER MD    Location [facility]-Cardiac & Pulmonary Rehab    Staff Present Birdie Sons, MPA, RN;Joseph Tessie Fass, [place], MS, ASCM CEP, Exercise Physiologist    Virtual Visit No    Medication changes reported     No    Fall or balance concerns reported    No    Warm-up and Cool-down Performed on first and last piece of equipment    Resistance Training Performed Yes    VAD Patient? No    PAD/SET Patient? No      Pain Assessment   Currently in Pain? No/denies                Social History   Tobacco Use  Smoking Status Never  Smokeless Tobacco Never    Goals Met:  Independence with exercise equipment Exercise tolerated well No report of concerns or symptoms today Strength training completed today  Goals Unmet:  Not Applicable  Comments: Pt able to follow exercise prescription today without complaint.  Will continue to monitor for progression.    Dr. Emily Filbert is Medical Director for [place].  Dr. Ottie Glazier is Medical Director for [facility] Pulmonary Rehabilitation.

## 2021-03-30 ENCOUNTER — Other Ambulatory Visit: Payer: Self-pay

## 2021-03-30 DIAGNOSIS — Z955 Presence of coronary angioplasty implant and graft: Secondary | ICD-10-CM

## 2021-03-30 DIAGNOSIS — I252 Old myocardial infarction: Secondary | ICD-10-CM | POA: Diagnosis not present

## 2021-03-30 DIAGNOSIS — I214 Non-ST elevation (NSTEMI) myocardial infarction: Secondary | ICD-10-CM

## 2021-03-30 NOTE — Progress Notes (Signed)
Daily Session Note  Patient Details  Name: Sean Reyes. MRN: [id number] Date of Birth: 12-12-1940 Referring Provider:   Flowsheet Row Cardiac Rehab from 01/19/2021 in [facility] Cardiac and Pulmonary Rehab  Referring Provider [name] MD       Encounter Date: 03/30/2021  Check In:  Session Check In - 03/30/21 1351       Check-In   Supervising physician immediately available to respond to emergencies See telemetry face sheet for immediately available ER MD    Location [facility]-Cardiac & Pulmonary Rehab    Staff Present Birdie Sons, MPA, RN;Jessica [place], MA, RCEP, CCRP, CCET;Joseph [place], [place]    Virtual Visit No    Medication changes reported     No    Fall or balance concerns reported    No    Warm-up and Cool-down Performed on first and last piece of equipment    Resistance Training Performed Yes    VAD Patient? No    PAD/SET Patient? No      Pain Assessment   Currently in Pain? No/denies                Social History   Tobacco Use  Smoking Status Never  Smokeless Tobacco Never    Goals Met:  Independence with exercise equipment Exercise tolerated well No report of concerns or symptoms today Strength training completed today  Goals Unmet:  Not Applicable  Comments: Pt able to follow exercise prescription today without complaint.  Will continue to monitor for progression.    Dr. Emily Filbert is Medical Director for [place].  Dr. Ottie Glazier is Medical Director for [facility] Pulmonary Rehabilitation.

## 2021-04-01 ENCOUNTER — Other Ambulatory Visit: Payer: Self-pay

## 2021-04-01 ENCOUNTER — Encounter: Payer: Medicare Other | Admitting: *Deleted

## 2021-04-01 DIAGNOSIS — I214 Non-ST elevation (NSTEMI) myocardial infarction: Secondary | ICD-10-CM

## 2021-04-01 DIAGNOSIS — Z955 Presence of coronary angioplasty implant and graft: Secondary | ICD-10-CM | POA: Diagnosis not present

## 2021-04-01 DIAGNOSIS — I252 Old myocardial infarction: Secondary | ICD-10-CM | POA: Diagnosis not present

## 2021-04-01 NOTE — Progress Notes (Signed)
Daily Session Note  Patient Details  Name: Sean Reyes. MRN: [id number] Date of Birth: 06/04/1940 Referring Provider:   Flowsheet Row Cardiac Rehab from 01/19/2021 in [facility] Cardiac and Pulmonary Rehab  Referring Provider [name] MD       Encounter Date: 04/01/2021  Check In:  Session Check In - 04/01/21 1112       Check-In   Supervising physician immediately available to respond to emergencies See telemetry face sheet for immediately available ER MD    Location [facility]-Cardiac & Pulmonary Rehab    Staff Present Renita Papa, RN BSN;Joseph [place], RCP,RRT,BSRT;Jessica [place], [place], RCEP, CCRP, CCET    Virtual Visit No    Medication changes reported     No    Fall or balance concerns reported    No    Warm-up and Cool-down Performed on first and last piece of equipment    Resistance Training Performed Yes    VAD Patient? No    PAD/SET Patient? No      Pain Assessment   Currently in Pain? No/denies                Social History   Tobacco Use  Smoking Status Never  Smokeless Tobacco Never    Goals Met:  Independence with exercise equipment Exercise tolerated well No report of concerns or symptoms today Strength training completed today  Goals Unmet:  Not Applicable  Comments: Pt able to follow exercise prescription today without complaint.  Will continue to monitor for progression.    Dr. Emily Filbert is Medical Director for [place].  Dr. Ottie Glazier is Medical Director for [facility] Pulmonary Rehabilitation.

## 2021-04-02 DIAGNOSIS — K219 Gastro-esophageal reflux disease without esophagitis: Secondary | ICD-10-CM | POA: Insufficient documentation

## 2021-04-02 NOTE — Patient Instructions (Addendum)
Coricidin over the counter -- or Claritin or Allegra are safe for cough to take.  Iron Deficiency Anemia, Adult Iron deficiency anemia is when you do not have enough red blood cells or hemoglobin in your blood. This happens because you have too little iron in your body. Hemoglobin carries oxygen to parts of the body. Anemia can cause your body to not get enough oxygen. What are the causes? Not eating enough foods that have iron in them. The body not being able to take in iron well. Needing more iron due to pregnancy or heavy menstrual periods, for females. Cancer. Bleeding in the bowels. Many blood draws. What increases the risk? Being pregnant. Being a teenage girl going through a growth spurt. What are the signs or symptoms? Pale skin, lips, and nails. Weakness, dizziness, and getting tired easily. Headache. Feeling like you cannot breathe well when moving (shortness of breath). Cold hands and feet. Fast heartbeat or a heartbeat that is not regular. Feeling grouchy (irritable) or breathing fast. These are more common in very bad anemia. Mild anemia may not cause any symptoms. How is this treated? This condition is treated by finding out why you do not have enough iron and then getting more iron. It may include: Adding foods to your diet that have a lot of iron. Taking iron pills (supplements). If you are pregnant or breastfeeding, you may need to take extra iron. Your diet often does not provide the amount of iron that you need. Getting more vitamin C in your diet. Vitamin C helps your body take in iron. You may need to take iron pills with a glass of orange juice or vitamin C pills. Medicines to make heavy menstrual periods lighter. Surgery. You may need blood tests to see if treatment is working. If the treatment does not seem to be working, you may need more tests. Follow these instructions at home: Medicines Take over-the-counter and prescription medicines only as told by your  doctor. This includes iron pills and vitamins. Take iron pills when your stomach is empty. If you cannot handle this, take them with food. Do not drink milk or take antacids at the same time as your iron pills. Iron pills may turn your poop (stool)black. If you cannot handle taking iron pills by mouth, ask your doctor about getting iron through: An IV tube. A shot (injection) into a muscle. Eating and drinking  Talk with your doctor before changing the foods you eat. He or she may tell you to eat foods that have a lot of iron, such as: Liver. Low-fat (lean) beef. Breads and cereals that have iron added to them. Eggs. Dried fruit. Dark green, leafy vegetables. Eat fresh fruits and vegetables that are high in vitamin C. They help your body use iron. Foods with a lot of vitamin C include: Oranges. Peppers. Tomatoes. Mangoes. Drink enough fluid to keep your pee (urine) pale yellow. Managing constipation If you are taking iron pills, they may cause trouble pooping (constipation). To prevent or treat trouble pooping, you may need to: Take over-the-counter or prescription medicines. Eat foods that are high in fiber. These include beans, whole grains, and fresh fruits and vegetables. Limit foods that are high in fat and sugar. These include fried or sweet foods. General instructions Return to your normal activities as told by your doctor. Ask your doctor what activities are safe for you. Keep yourself clean, and keep things clean around you. Keep all follow-up visits as told by your doctor. This is  important. Contact a doctor if: You feel like you may vomit (nauseous), or you vomit. You feel weak. You are sweating for no reason. You have trouble pooping, such as: Pooping less than 3 times a week. Straining to poop. Having poop that is hard, dry, or larger than normal. Feeling full or bloated. Pain in the lower belly. Not feeling better after pooping. Get help right away if: You  pass out (faint). You have chest pain. You have trouble breathing that: Is very bad. Gets worse with physical activity. You have a fast heartbeat, or a heartbeat that does not feel regular. You get light-headed when getting up from sitting or lying down. These symptoms may be an emergency. Do not wait to see if the symptoms will go away. Get medical help right away. Call your local emergency services (911 in the [place] Do not drive yourself to the hospital. Summary Iron deficiency anemia is when you have too little iron in your body. This condition is treated by finding out why you do not have enough iron in your body and then getting more iron. Take over-the-counter and prescription medicines only as told by your doctor. Eat fresh fruits and vegetables that are high in vitamin C. Get help right away if you cannot breathe well. This information is not intended to replace advice given to you by your health care provider. Make sure you discuss any questions you have with your health care provider. Document Revised: 10/08/2018 Document Reviewed: 10/08/2018 Elsevier Patient Education  [place].

## 2021-04-04 ENCOUNTER — Other Ambulatory Visit: Payer: Self-pay

## 2021-04-04 ENCOUNTER — Encounter: Payer: Medicare Other | Admitting: *Deleted

## 2021-04-04 ENCOUNTER — Encounter: Payer: Self-pay | Admitting: Nurse Practitioner

## 2021-04-04 ENCOUNTER — Ambulatory Visit (INDEPENDENT_AMBULATORY_CARE_PROVIDER_SITE_OTHER): Payer: Medicare Other | Admitting: Nurse Practitioner

## 2021-04-04 VITALS — BP 118/74 | HR 54 | Temp 98.3°F | Ht 71.5 in | Wt 193.8 lb

## 2021-04-04 DIAGNOSIS — E1129 Type 2 diabetes mellitus with other diabetic kidney complication: Secondary | ICD-10-CM

## 2021-04-04 DIAGNOSIS — E1143 Type 2 diabetes mellitus with diabetic autonomic (poly)neuropathy: Secondary | ICD-10-CM

## 2021-04-04 DIAGNOSIS — R051 Acute cough: Secondary | ICD-10-CM

## 2021-04-04 DIAGNOSIS — Z955 Presence of coronary angioplasty implant and graft: Secondary | ICD-10-CM | POA: Diagnosis not present

## 2021-04-04 DIAGNOSIS — K219 Gastro-esophageal reflux disease without esophagitis: Secondary | ICD-10-CM | POA: Diagnosis not present

## 2021-04-04 DIAGNOSIS — E1159 Type 2 diabetes mellitus with other circulatory complications: Secondary | ICD-10-CM

## 2021-04-04 DIAGNOSIS — R059 Cough, unspecified: Secondary | ICD-10-CM | POA: Insufficient documentation

## 2021-04-04 DIAGNOSIS — I739 Peripheral vascular disease, unspecified: Secondary | ICD-10-CM

## 2021-04-04 DIAGNOSIS — G894 Chronic pain syndrome: Secondary | ICD-10-CM | POA: Diagnosis not present

## 2021-04-04 DIAGNOSIS — D6869 Other thrombophilia: Secondary | ICD-10-CM

## 2021-04-04 DIAGNOSIS — N1831 Chronic kidney disease, stage 3a: Secondary | ICD-10-CM

## 2021-04-04 DIAGNOSIS — E1169 Type 2 diabetes mellitus with other specified complication: Secondary | ICD-10-CM

## 2021-04-04 DIAGNOSIS — D649 Anemia, unspecified: Secondary | ICD-10-CM | POA: Diagnosis not present

## 2021-04-04 DIAGNOSIS — I252 Old myocardial infarction: Secondary | ICD-10-CM | POA: Diagnosis not present

## 2021-04-04 DIAGNOSIS — I480 Paroxysmal atrial fibrillation: Secondary | ICD-10-CM

## 2021-04-04 DIAGNOSIS — E785 Hyperlipidemia, unspecified: Secondary | ICD-10-CM

## 2021-04-04 DIAGNOSIS — I25110 Atherosclerotic heart disease of native coronary artery with unstable angina pectoris: Secondary | ICD-10-CM

## 2021-04-04 DIAGNOSIS — R809 Proteinuria, unspecified: Secondary | ICD-10-CM

## 2021-04-04 DIAGNOSIS — I152 Hypertension secondary to endocrine disorders: Secondary | ICD-10-CM

## 2021-04-04 DIAGNOSIS — Z9689 Presence of other specified functional implants: Secondary | ICD-10-CM

## 2021-04-04 DIAGNOSIS — I214 Non-ST elevation (NSTEMI) myocardial infarction: Secondary | ICD-10-CM

## 2021-04-04 DIAGNOSIS — G4733 Obstructive sleep apnea (adult) (pediatric): Secondary | ICD-10-CM

## 2021-04-04 LAB — MICROALBUMIN, URINE WAIVED
Creatinine, Urine Waived: 300 mg/dL (ref 10–300)
Microalb, Ur Waived: 80 mg/L — ABNORMAL HIGH (ref 0–19)

## 2021-04-04 LAB — BAYER DCA HB A1C WAIVED: HB A1C (BAYER DCA - WAIVED): 6.4 % — ABNORMAL HIGH (ref 4.8–5.6)

## 2021-04-04 MED ORDER — AMOXICILLIN-POT CLAVULANATE 875-125 MG PO TABS: 1.0000 | ORAL_TABLET | Freq: Two times a day (BID) | ORAL | 0 refills | Status: AC

## 2021-04-04 MED ORDER — METFORMIN HCL 500 MG PO TABS: 500.0000 mg | ORAL_TABLET | Freq: Two times a day (BID) | ORAL | 4 refills | Status: DC

## 2021-04-04 NOTE — Assessment & Plan Note (Signed)
Chronic, stable with BP initially elevated today, but repeat at goal and home on average at goal.  A1c today 6.5%.  Continue Metformin and current cardiac medications -- may restart Benazepril if BP elevations at home and CMP stable, would benefit the proteinuria of 80 on labs today.  Recommend he continue to check BP at home at least 3 mornings a week and document.  Return in 3 months.

## 2021-04-04 NOTE — Assessment & Plan Note (Signed)
To bilateral lower extremity.  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  Continue ASA daily and monitor closely for any wounds.  Return to vascular as needed.

## 2021-04-04 NOTE — Assessment & Plan Note (Signed)
Continue collaboration with pain management. 

## 2021-04-04 NOTE — Assessment & Plan Note (Signed)
Chronic, stable.  Continue collaboration with hematology, recent notes and labs reviewed.

## 2021-04-04 NOTE — Assessment & Plan Note (Signed)
Is going to be attending pulmonary for new equipment upcoming.

## 2021-04-04 NOTE — Assessment & Plan Note (Signed)
Chronic, ongoing with A1c 6.5% today and urine ALB 80.  Decreased sensation bilateral feet, monitor closely for wounds and falls.  Continue current medication regimen and adjust as needed.  Would benefit from return to Benazepril in future or alternate ACE or ARB for kidney protection with proteinuria.  Continue to monitor BS daily and document for visits.

## 2021-04-04 NOTE — Progress Notes (Signed)
BP 118/74 (BP Location: Left Arm)    Pulse (!) 54    Temp 98.3 F (36.8 C) (Oral)    Ht 5' 11.5" (1.816 m)    Wt 193 lb 12.8 oz (87.9 kg)    SpO2 99%    BMI 26.65 kg/m    Subjective:    Patient ID: Sean Reyes., male    DOB: 1940/06/10, 81 y.o.   MRN: [id number]  HPI: Sean Reyes. is a 81 y.o. male  Chief Complaint  Patient presents with   Hypertension   Hyperlipidemia   Diabetes   Anemia   Cough    Patient states he has been coughing since last week and he is coughing up clear phlegm. Patient denies having any fever or chills. Patient denies trying any over the counter medication. Patient states it gets worse at night and early in the morning. Patient states majority of the time it is a dry cough and he notices some wheezing in the middle of the night.    DIABETES A1c 6.6% in November.  Continues Metformin 500 MG BID. Hypoglycemic episodes:no Polydipsia/polyuria: no Visual disturbance: no Chest pain: no Paresthesias: no Glucose Monitoring: yes  Accucheck frequency: Daily  Fasting glucose: 120-130  Post prandial:  Evening:  Before meals: Taking Insulin?: no  Long acting insulin:  Short acting insulin: Blood Pressure Monitoring: daily Retinal Examination: Not up to Date -- Dr. Atilano Median in [place] Exam: Up to Date Pneumovax: Up to Date Influenza: Up to Date Aspirin: no    HYPERTENSION / HYPERLIPIDEMIA/A-FIB Sees Dr. Nehemiah Massed for cardiology.  Last seen 01/19/21.  Underwent PCI with DES to the proximal RCA 12/22/20 due to NSTEMI and is continuing cardiac rehab at this time.  Is to take Plavix and Eliquis, ASA stopped in November.     Taking Imdur, Metoprolol, Propafenone. Taking Rosuvastatin 5 MG.  Satisfied with current treatment? yes Duration of hypertension: chronic BP monitoring frequency: a few times a week BP range: 120-140/80's BP medication side effects: no Duration of hyperlipidemia: chronic Cholesterol medication side effects: no Cholesterol  supplements: none Medication compliance: good compliance Aspirin: no Recent stressors: no Recurrent headaches: no Visual changes: no Palpitations: no Dyspnea: no Chest pain: no Lower extremity edema: no Dizzy/lightheaded: no    CHRONIC KIDNEY DISEASE Recent labs with hematology on 03/09/21 noted below. CKD status: stable Medications renally dose: yes Previous renal evaluation: no Pneumovax:  Up to Date Influenza Vaccine:  Up to Date  Lab Results  Component Value Date   CREATININE 1.43 (H) 03/09/2021   CREATININE 1.28 (H) 12/31/2020   CREATININE 1.17 12/23/2020    [facility] recommended if ongoing to refer to hematology, is following with them and last saw 03/09/21.  They did 4 iron infusions. Anemia status: stable Etiology of anemia: post surgical Duration of anemia treatment:  Compliance with treatment: good compliance Iron supplementation side effects: no Severity of anemia: mild Fatigue: none -- improved Decreased exercise tolerance: no  Dyspnea on exertion: no Palpitations: no Bleeding: no Pica: no   COUGH Started last week with a cough, worse in the morning and at night.  Continues on Omeprazole for heart burn, 40 MG daily.   Duration: weeks Circumstances of initial development of cough: unknown Cough severity: moderate Cough description: non-productive and hacking Aggravating factors:  worse at night and worse in the AM Alleviating factors: nothing Status:  fluctuating Treatments attempted: none Wheezing: yes Shortness of breath: no Chest pain: no Chest tightness:no Nasal congestion: yes Runny nose:  no Postnasal drip: no Frequent throat clearing or swallowing: yes Hemoptysis: no Fevers: no Night sweats: no Weight loss: no Heartburn: no Recent foreign travel: no Tuberculosis contacts: no    Relevant past medical, surgical, family and social history reviewed and updated as indicated. Interim medical history since our last visit reviewed. Allergies  and medications reviewed and updated.  Review of Systems  Constitutional:  Negative for activity change, appetite change, diaphoresis, fatigue and fever.  Respiratory:  Negative for cough, chest tightness, shortness of breath and wheezing.   Cardiovascular:  Negative for chest pain, palpitations and leg swelling.  Gastrointestinal: Negative.   Endocrine: Negative for cold intolerance, heat intolerance, polydipsia, polyphagia and polyuria.  Neurological: Negative.   Psychiatric/Behavioral: Negative.     Per HPI unless specifically indicated above     Objective:    BP 118/74 (BP Location: Left Arm)    Pulse (!) 54    Temp 98.3 F (36.8 C) (Oral)    Ht 5' 11.5" (1.816 m)    Wt 193 lb 12.8 oz (87.9 kg)    SpO2 99%    BMI 26.65 kg/m   Wt Readings from Last 3 Encounters:  04/04/21 193 lb 12.8 oz (87.9 kg)  03/09/21 191 lb 1.6 oz (86.7 kg)  01/19/21 189 lb 11.2 oz (86 kg)    Physical Exam Vitals and nursing note reviewed.  Constitutional:      General: He is awake. He is not in acute distress.    Appearance: He is well-developed and well-groomed. He is not ill-appearing.  HENT:     Head: Normocephalic and atraumatic.     Right Ear: Hearing normal. No drainage.     Left Ear: Hearing normal. No drainage.  Eyes:     General: Lids are normal.        Right eye: No discharge.        Left eye: No discharge.     Conjunctiva/sclera: Conjunctivae normal.     Pupils: Pupils are equal, round, and reactive to light.  Neck:     Thyroid: No thyromegaly.     Vascular: No carotid bruit.     Trachea: Trachea normal.  Cardiovascular:     Rate and Rhythm: Normal rate and regular rhythm.     Heart sounds: Normal heart sounds, S1 normal and S2 normal. No murmur heard.   No gallop.  Pulmonary:     Effort: Pulmonary effort is normal. No accessory muscle usage or respiratory distress.     Breath sounds: Normal breath sounds.  Abdominal:     General: Bowel sounds are normal.     Palpations:  Abdomen is soft.  Musculoskeletal:        General: Normal range of motion.     Cervical back: Normal range of motion and neck supple.     Right lower leg: No edema.     Left lower leg: No edema.  Lymphadenopathy:     Cervical: No cervical adenopathy.  Skin:    General: Skin is warm and dry.     Capillary Refill: Capillary refill takes less than 2 seconds.  Neurological:     Mental Status: He is alert and oriented to person, place, and time.     Deep Tendon Reflexes:     Reflex Scores:      Patellar reflexes are 1+ on the right side and 1+ on the left side.      Achilles reflexes are 1+ on the right side and 1+ on the left  side.    Comments: Antalgic gait, using cane.  Psychiatric:        Attention and Perception: Attention normal.        Mood and Affect: Mood normal.        Speech: Speech normal.        Behavior: Behavior normal. Behavior is cooperative.        Thought Content: Thought content normal.    Results for orders placed or performed in visit on 03/09/21  Haptoglobin  Result Value Ref Range   Haptoglobin 196 34 - 355 mg/dL  Lactate dehydrogenase  Result Value Ref Range   LDH 129 98 - 192 U/L  Multiple Myeloma Panel (SPEP&IFE w/QIG)  Result Value Ref Range   IgG (Immunoglobin G), Serum 1,027 603 - 1,613 mg/dL   IgA 286 61 - 437 mg/dL   IgM (Immunoglobulin M), Srm 113 15 - 143 mg/dL   Total Protein ELP 6.8 6.0 - 8.5 g/dL   Albumin SerPl Elph-Mcnc 3.7 2.9 - 4.4 g/dL   Alpha 1 0.3 0.0 - 0.4 g/dL   Alpha2 Glob SerPl Elph-Mcnc 0.8 0.4 - 1.0 g/dL   B-Globulin SerPl Elph-Mcnc 0.9 0.7 - 1.3 g/dL   Gamma Glob SerPl Elph-Mcnc 1.0 0.4 - 1.8 g/dL   M Protein SerPl Elph-Mcnc Not Observed Not Observed g/dL   Globulin, Total 3.1 2.2 - 3.9 g/dL   Albumin/Glob SerPl 1.2 0.7 - 1.7   IFE 1 Comment    Please Note Comment   CBC with Differential  Result Value Ref Range   WBC 5.3 4.0 - 10.5 K/uL   RBC 3.85 (L) 4.22 - 5.81 MIL/uL   Hemoglobin 12.2 (L) 13.0 - 17.0 g/dL   HCT  35.7 (L) 39.0 - 52.0 %   MCV 92.7 80.0 - 100.0 fL   MCH 31.7 26.0 - 34.0 pg   MCHC 34.2 30.0 - 36.0 g/dL   RDW 13.4 11.5 - 15.5 %   Platelets 148 (L) 150 - 400 K/uL   nRBC 0.0 0.0 - 0.2 %   Neutrophils Relative % 56 %   Neutro Abs 3.0 1.7 - 7.7 K/uL   Lymphocytes Relative 29 %   Lymphs Abs 1.5 0.7 - 4.0 K/uL   Monocytes Relative 12 %   Monocytes Absolute 0.6 0.1 - 1.0 K/uL   Eosinophils Relative 2 %   Eosinophils Absolute 0.1 0.0 - 0.5 K/uL   Basophils Relative 0 %   Basophils Absolute 0.0 0.0 - 0.1 K/uL   Immature Granulocytes 1 %   Abs Immature Granulocytes 0.05 0.00 - 0.07 K/uL  Comprehensive metabolic panel  Result Value Ref Range   Sodium 135 135 - 145 mmol/L   Potassium 4.5 3.5 - 5.1 mmol/L   Chloride 100 98 - 111 mmol/L   CO2 28 22 - 32 mmol/L   Glucose, Bld 188 (H) 70 - 99 mg/dL   BUN 23 8 - 23 mg/dL   Creatinine, Ser 1.43 (H) 0.61 - 1.24 mg/dL   Calcium 9.4 8.9 - 10.3 mg/dL   Total Protein 7.3 6.5 - 8.1 g/dL   Albumin 3.9 3.5 - 5.0 g/dL   AST 28 15 - 41 U/L   ALT 24 0 - 44 U/L   Alkaline Phosphatase 62 38 - 126 U/L   Total Bilirubin 0.3 0.3 - 1.2 mg/dL   GFR, Estimated 50 (L) >60 mL/min   Anion gap 7 5 - 15  Kappa/lambda light chains  Result Value Ref Range   Kappa free light  chain 49.4 (H) 3.3 - 19.4 mg/L   Lambda free light chains 34.4 (H) 5.7 - 26.3 mg/L   Kappa, lambda light chain ratio 1.44 0.26 - 1.65  Reticulocytes  Result Value Ref Range   Retic Ct Pct 1.3 0.4 - 3.1 %   RBC. 3.84 (L) 4.22 - 5.81 MIL/uL   Retic Count, Absolute 49.2 19.0 - 186.0 K/uL   Immature Retic Fract 14.0 2.3 - 15.9 %  Technologist smear review  Result Value Ref Range   WBC MORPHOLOGY Normal RBC, WBC, and platelet    RBC MORPHOLOGY Normal RBC, WBC, and platelet    Tech Review Normal RBC, WBC, and platelet       Assessment & Plan:   Problem List Items Addressed This Visit       Cardiovascular and Mediastinum   AF (paroxysmal atrial fibrillation) (HCC)    Chronic,  ongoing.  Continue current medication regimen and collaboration with cardiology.  Monitor CBC closely due to anemia.      Relevant Orders   Microalbumin, Urine Waived   Comprehensive metabolic panel   Coronary artery disease    Ongoing, continue collaboration with cardiology and current medication regimen.      Hypertension associated with diabetes ([place])    Chronic, stable with BP initially elevated today, but repeat at goal and home on average at goal.  A1c today 6.5%.  Continue Metformin and current cardiac medications -- may restart Benazepril if BP elevations at home and CMP stable, would benefit the proteinuria of 80 on labs today.  Recommend he continue to check BP at home at least 3 mornings a week and document.  Return in 3 months.      Relevant Medications   metFORMIN (GLUCOPHAGE) 500 MG tablet   Other Relevant Orders   Microalbumin, Urine Waived   Bayer DCA Hb A1c Waived   Comprehensive metabolic panel   TSH   Peripheral vascular disease ([place])    To bilateral lower extremity.  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  Continue ASA daily and monitor closely for any wounds.  Return to vascular as needed.      Relevant Orders   Comprehensive metabolic panel   Lipid Panel w/o Chol/HDL Ratio     Respiratory   OSA (obstructive sleep apnea)    Is going to be attending pulmonary for new equipment upcoming.        Digestive   GERD without esophagitis    Chronic, stable with Omeprazole daily.  Continue this regimen and adjust as needed.  Mag level today.      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  Decreased sensation bilateral feet, monitor closely for wounds and falls.  Continue current medication regimen and adjust as needed.  Would benefit from return to Benazepril in future or alternate ACE or  ARB for kidney protection with proteinuria.  Continue to monitor BS daily and document for visits.      Relevant Medications   metFORMIN (GLUCOPHAGE) 500 MG tablet   Other Relevant Orders   Microalbumin, Urine Waived   Bayer DCA Hb A1c Waived   Comprehensive metabolic panel   Diabetes mellitus with proteinuria (HCC)    Chronic, ongoing with A1c 6.5% today and urine ALB 80.  Decreased sensation bilateral feet, monitor closely for  wounds and falls.  Continue current medication regimen and adjust as needed.  Would benefit from return to Benazepril in future or alternate ACE or ARB for kidney protection with proteinuria.  Continue to monitor BS daily and document for visits.      Relevant Medications   metFORMIN (GLUCOPHAGE) 500 MG tablet   Hyperlipidemia associated with type 2 diabetes mellitus (HCC)    Chronic, ongoing.  Continue current medication regimen, tolerating 5 MG Crestor well without ADR, and adjust as needed.  Lipid panel today.      Relevant Medications   metFORMIN (GLUCOPHAGE) 500 MG tablet   Other Relevant Orders   Bayer DCA Hb A1c Waived   Comprehensive metabolic panel   Lipid Panel w/o Chol/HDL Ratio     Genitourinary   CKD (chronic kidney disease) stage 3, GFR 30-59 ml/min (HCC)    Chronic, ongoing.  Continue to monitor closely and refer to nephrology if decline -- recent AKI in hospital.  Consider restart of Benazepril for kidney protection if levels on labs today are returned to baseline.  Renal dose medications as needed based on labs.  CMP today.      Relevant Orders   Microalbumin, Urine Waived   Comprehensive metabolic panel     Hematopoietic and Hemostatic   Acquired thrombophilia ([place])    Patient on Eliquis with A-fib, monitor CBC regularly.        Other   Chronic pain syndrome   Cough    Present for >7 days, lung assessment reassuring and overall assessment stable.  Send in Augmentin to take for 7 days + recommend OTC Coricidin or Claritin for  symptoms relief.  Return if worsening or ongoing.      Spinal cord stimulator status    Continue collaboration with pain management.      Symptomatic anemia    Chronic, stable.  Continue collaboration with hematology, recent notes and labs reviewed.        Follow up plan: Return in about 6 months (around 10/02/2021) for T2DM, HTN/HLD, A-FIB, CKD, PAIN.

## 2021-04-04 NOTE — Assessment & Plan Note (Signed)
Chronic, stable with Omeprazole daily.  Continue this regimen and adjust as needed.  Mag level today. 

## 2021-04-04 NOTE — Assessment & Plan Note (Signed)
Present for >7 days, lung assessment reassuring and overall assessment stable.  Send in Augmentin to take for 7 days + recommend OTC Coricidin or Claritin for symptoms relief.  Return if worsening or ongoing.

## 2021-04-04 NOTE — Assessment & Plan Note (Signed)
Chronic, ongoing.  Continue to monitor closely and refer to nephrology if decline -- recent AKI in hospital.  Consider restart of Benazepril for kidney protection if levels on labs today are returned to baseline.  Renal dose medications as needed based on labs.  CMP today.

## 2021-04-04 NOTE — Assessment & Plan Note (Signed)
Chronic, ongoing.  Continue current medication regimen, tolerating 5 MG Crestor well without ADR, and adjust as needed.  Lipid panel today. 

## 2021-04-04 NOTE — Progress Notes (Signed)
Daily Session Note  Patient Details  Name: Sean Reyes. MRN: [id number] Date of Birth: Feb 02, 1941 Referring Provider:   Flowsheet Row Cardiac Rehab from 01/19/2021 in [facility]  Referring Provider [name] MD       Encounter Date: 04/04/2021  Check In:  Session Check In - 04/04/21 1422       Check-In   Supervising physician immediately available to respond to emergencies See telemetry face sheet for immediately available ER MD    Location [facility]-Cardiac & Pulmonary Rehab    Staff Present Nyoka Cowden, RN, BSN, Tyna Jaksch, MS, ASCM CEP, Exercise Physiologist;Joseph Tessie Fass, [place]    Virtual Visit No    Medication changes reported     No    Fall or balance concerns reported    No    Tobacco Cessation No Change    Warm-up and Cool-down Performed on first and last piece of equipment    Resistance Training Performed Yes    VAD Patient? No    PAD/SET Patient? No      Pain Assessment   Currently in Pain? No/denies                Social History   Tobacco Use  Smoking Status Never  Smokeless Tobacco Never    Goals Met:  Independence with exercise equipment Exercise tolerated well No report of concerns or symptoms today  Goals Unmet:  Not Applicable  Comments: Pt able to follow exercise prescription today without complaint.  Will continue to monitor for progression.    Dr. Emily Filbert is Medical Director for [facility].  Dr. Ottie Glazier is Medical Director for [facility] Pulmonary Rehabilitation.

## 2021-04-04 NOTE — Assessment & Plan Note (Signed)
Ongoing, continue collaboration with cardiology and current medication regimen. 

## 2021-04-04 NOTE — Assessment & Plan Note (Signed)
Patient on Eliquis with A-fib, monitor CBC regularly. 

## 2021-04-04 NOTE — Assessment & Plan Note (Signed)
Chronic, ongoing.  Continue current medication regimen and collaboration with cardiology.  Monitor CBC closely due to anemia.

## 2021-04-05 LAB — COMPREHENSIVE METABOLIC PANEL
ALT: 14 IU/L (ref 0–44)
AST: 13 IU/L (ref 0–40)
Albumin/Globulin Ratio: 1.6 (ref 1.2–2.2)
Albumin: 3.9 g/dL (ref 3.7–4.7)
Alkaline Phosphatase: 69 IU/L (ref 44–121)
BUN/Creatinine Ratio: 15 (ref 10–24)
BUN: 22 mg/dL (ref 8–27)
Bilirubin Total: 0.4 mg/dL (ref 0.0–1.2)
CO2: 25 mmol/L (ref 20–29)
Calcium: 9.4 mg/dL (ref 8.6–10.2)
Chloride: 99 mmol/L (ref 96–106)
Creatinine, Ser: 1.48 mg/dL — ABNORMAL HIGH (ref 0.76–1.27)
Globulin, Total: 2.5 g/dL (ref 1.5–4.5)
Glucose: 225 mg/dL — ABNORMAL HIGH (ref 70–99)
Potassium: 4.3 mmol/L (ref 3.5–5.2)
Sodium: 135 mmol/L (ref 134–144)
Total Protein: 6.4 g/dL (ref 6.0–8.5)
eGFR: 48 mL/min/{1.73_m2} — ABNORMAL LOW (ref >59)

## 2021-04-05 LAB — LIPID PANEL W/O CHOL/HDL RATIO
Cholesterol, Total: 101 mg/dL (ref 100–199)
HDL: 40 mg/dL (ref >39)
LDL Chol Calc (NIH): 43 mg/dL (ref 0–99)
Triglycerides: 96 mg/dL (ref 0–149)
VLDL Cholesterol Cal: 18 mg/dL (ref 5–40)

## 2021-04-05 LAB — TSH: TSH: 4.05 u[IU]/mL (ref 0.450–4.500)

## 2021-04-05 LAB — MAGNESIUM: Magnesium: 1.7 mg/dL (ref 1.6–2.3)

## 2021-04-05 NOTE — Progress Notes (Signed)
Contacted via [place] evening Lieutenant, your labs have returned: - Kidney function, creatinine and eGFR, continues to show some ongoing kidney disease.  It would be good in future to restart your Benazepril at a lower dose to ensure protection of the kidneys. Remainder of labs are all stable.  Any questions for me? Keep being amazing!!  Thank you for allowing me to participate in your care.  I appreciate you. Kindest regards, Fionnuala Hemmerich

## 2021-04-06 ENCOUNTER — Encounter: Payer: Self-pay | Admitting: *Deleted

## 2021-04-06 DIAGNOSIS — Z955 Presence of coronary angioplasty implant and graft: Secondary | ICD-10-CM

## 2021-04-06 DIAGNOSIS — I214 Non-ST elevation (NSTEMI) myocardial infarction: Secondary | ICD-10-CM

## 2021-04-06 NOTE — Progress Notes (Signed)
Cardiac Individual Treatment Plan  Patient Details  Name: Sean Reyes. MRN: [id number] Date of Birth: [birth year] Referring Provider:   Flowsheet Row Cardiac Rehab from 01/19/2021 in [facility] Cardiac and Pulmonary Rehab  Referring Provider [name] MD       Initial Encounter Date:  Flowsheet Row Cardiac Rehab from 01/19/2021 in [facility] Cardiac and Pulmonary Rehab  Date 01/19/21       Visit Diagnosis: NSTEMI (non-ST elevated myocardial infarction) [facility])  Status post coronary artery stent placement  Patient's Home Medications on Admission:  Current Outpatient Medications:    acetaminophen (TYLENOL) 500 MG tablet, Take 500 mg by mouth every 6 (six) hours as needed (pain)., Disp: , Rfl:    amoxicillin-clavulanate (AUGMENTIN) 875-125 MG tablet, Take 1 tablet by mouth 2 (two) times daily for 7 days., Disp: 14 tablet, Rfl: 0   clopidogrel (PLAVIX) 75 MG tablet, Take 75 mg by mouth daily., Disp: , Rfl:    cyanocobalamin 1000 MCG tablet, Take by mouth., Disp: , Rfl:    ELIQUIS 5 MG TABS tablet, Take 1 tablet (5 mg total) by mouth 2 (two) times daily., Disp: 180 tablet, Rfl: 4   finasteride (PROSCAR) 5 MG tablet, Take 1 tablet (5 mg total) by mouth daily., Disp: 90 tablet, Rfl: 2   isosorbide mononitrate (IMDUR) 30 MG 24 hr tablet, Take 30 mg by mouth daily., Disp: , Rfl:    metFORMIN (GLUCOPHAGE) 500 MG tablet, Take 1 tablet (500 mg total) by mouth 2 (two) times daily with a meal., Disp: 180 tablet, Rfl: 4   metoprolol succinate (TOPROL-XL) 25 MG 24 hr tablet, Take 1 tablet by mouth daily., Disp: , Rfl:    omeprazole (PRILOSEC) 40 MG capsule, Take 40 mg by mouth daily., Disp: , Rfl:    propafenone (RYTHMOL SR) 425 MG 12 hr capsule, Take 425 mg by mouth 2 (two) times daily. Has been out for 3 days, Disp: , Rfl:    rosuvastatin (CRESTOR) 5 MG tablet, Take 1 tablet (5 mg total) by mouth daily., Disp: 90 tablet, Rfl: 2   tamsulosin (FLOMAX) 0.4 MG CAPS capsule, Take 0.8 mg by mouth  daily., Disp: , Rfl:   Past Medical History: Past Medical History:  Diagnosis Date   Anemia    Anxiety    Arthritis    Arthritis of neck    Atrial fibrillation (HCC)    Cataracts, bilateral    Complication of anesthesia    pt reports low BP's after surgery at [facility] and difficulty awakening   Depression    Diabetes ([place])    dx 6-8 yrs ago   Dysrhythmia    a-fib   GERD (gastroesophageal reflux disease)    OCC TAKES ALKA SELTZER   History of kidney stones    10-15 yrs ago   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   Hyperlipidemia    Hypertension    Myocardial infarction ([place]) 12/2020   Nocturia    S/P ablation of atrial fibrillation    Ablative therapy   Sleep apnea    CPAP    Spinal stenosis    Tachycardia, unspecified     Tobacco Use: Social History   Tobacco Use  Smoking Status Never  Smokeless Tobacco Never    Labs: Recent Review Flowsheet Data     Labs for ITP Cardiac and Pulmonary Rehab Latest Ref Rng & Units 06/10/2020 09/09/2020 12/10/2020 12/21/2020 04/04/2021   Cholestrol 100 - 199 mg/dL - 105 - 94 101  LDLCALC 0 - 99 mg/dL - 43 - 45 43   HDL >39 mg/dL - 41 - 36(L) 40   Trlycerides 0 - 149 mg/dL - 113 - 67 96   Hemoglobin A1c 4.8 - 5.6 % 6.7 6.6 6.5(H) 6.6(H) 6.4(H)        Exercise Target Goals: Exercise Program Goal: Individual exercise prescription set using results from initial 6 min walk test and THRR while considering  patients activity barriers and safety.   Exercise Prescription Goal: Initial exercise prescription builds to 30-45 minutes a day of aerobic activity, 2-3 days per week.  Home exercise guidelines will be given to patient during program as part of exercise prescription that the participant will acknowledge.   Education: Aerobic Exercise: - Group verbal and visual presentation on the components of exercise prescription. Introduces F.I.T.T principle from ACSM for exercise prescriptions.  Reviews F.I.T.T.  principles of aerobic exercise including progression. Written material given at graduation. Flowsheet Row Cardiac Rehab from 03/30/2021 in [facility] Cardiac and Pulmonary Rehab  Date 02/09/21  Educator [place]  Instruction Review Code 1- Verbalizes Understanding       Education: Resistance Exercise: - Group verbal and visual presentation on the components of exercise prescription. Introduces F.I.T.T principle from ACSM for exercise prescriptions  Reviews F.I.T.T. principles of resistance exercise including progression. Written material given at graduation. Flowsheet Row Cardiac Rehab from 03/30/2021 in [facility] Cardiac and Pulmonary Rehab  Date 02/16/21  Educator AS  Instruction Review Code 1- Verbalizes Understanding        Education: Exercise & Equipment Safety: - Individual verbal instruction and demonstration of equipment use and safety with use of the equipment. Flowsheet Row Cardiac Rehab from 03/30/2021 in [facility] Cardiac and Pulmonary Rehab  Education need identified 01/19/21  Date 01/19/21  Educator [place]  Instruction Review Code 1- Verbalizes Understanding       Education: Exercise Physiology & General Exercise Guidelines: - Group verbal and written instruction with models to review the exercise physiology of the cardiovascular system and associated critical values. Provides general exercise guidelines with specific guidelines to those with heart or lung disease.  Flowsheet Row Cardiac Rehab from 03/30/2021 in [facility] Cardiac and Pulmonary Rehab  Education need identified 01/19/21       Education: Flexibility, Balance, Mind/Body Relaxation: - Group verbal and visual presentation with interactive activity on the components of exercise prescription. Introduces F.I.T.T principle from ACSM for exercise prescriptions. Reviews F.I.T.T. principles of flexibility and balance exercise training including progression. Also discusses the mind body connection.  Reviews various relaxation techniques to help  reduce and manage stress (i.e. Deep breathing, progressive muscle relaxation, and visualization). Balance handout provided to take home. Written material given at graduation.   Activity Barriers & Risk Stratification:  Activity Barriers & Cardiac Risk Stratification - 01/19/21 1245       Activity Barriers & Cardiac Risk Stratification   Activity Barriers Other (comment);Deconditioning;Back Problems;Joint Problems;Left Hip Replacement;Right Hip Replacement;Muscular Weakness;Assistive Device    Comments neuropathy; pain stimulator inserted April 2021 for back; 3 hip replacements, neuropathy in legs    Cardiac Risk Stratification High             6 Minute Walk:  6 Minute Walk     Row Name 01/19/21 1250         6 Minute Walk   Phase Initial     Distance 550 feet     Walk Time 6 minutes     # of Rest Breaks 0     MPH  1.04     METS 1.2     RPE 12     Perceived Dyspnea  1     VO2 Peak 4.21     Symptoms Yes (comment)     Comments Fatigue, Back pain 4/10     Resting HR 58 bpm     Resting BP 138/72     Resting Oxygen Saturation  99 %     Exercise Oxygen Saturation  during 6 min walk 98 %     Max Ex. HR 79 bpm     Max Ex. BP 146/70     2 Minute Post BP 140/72              Oxygen Initial Assessment:   Oxygen Re-Evaluation:   Oxygen Discharge (Final Oxygen Re-Evaluation):   Initial Exercise Prescription:  Initial Exercise Prescription - 01/19/21 1200       Date of Initial Exercise RX and Referring Provider   Date 01/19/21    Referring Provider Paraschos, Alexander MD      Oxygen   Maintain Oxygen Saturation 88% or higher      Treadmill   MPH 1    Grade 0    Minutes 15    METs 1.8      Recumbant Bike   Level 1    RPM 60    Watts 15    Minutes 15    METs 1      NuStep   Level 1    SPM 80    Minutes 15    METs 1      REL-XR   Level 1    Speed 50    Minutes 15    METs 1      Prescription Details   Frequency (times per week) 2     Duration Progress to 30 minutes of continuous aerobic without signs/symptoms of physical distress      Intensity   THRR 40-80% of Max Heartrate 91-125    Ratings of Perceived Exertion 11-13    Perceived Dyspnea 0-4      Progression   Progression Continue to progress workloads to maintain intensity without signs/symptoms of physical distress.      Resistance Training   Training Prescription Yes    Weight 3 lb    Reps 10-15             Perform Capillary Blood Glucose checks as needed.  Exercise Prescription Changes:   Exercise Prescription Changes     Row Name 01/19/21 1200 01/31/21 1000 02/15/21 1400 03/03/21 0900 03/14/21 1300     Response to Exercise   Blood Pressure (Admit) 138/72 126/64 122/68 122/60 122/60   Blood Pressure (Exercise) 146/70 116/60 124/70 124/62 --   Blood Pressure (Exit) 140/72 124/62 112/60 108/60 120/80   Heart Rate (Admit) 58 bpm 51 bpm 68 bpm 58 bpm 67 bpm   Heart Rate (Exercise) 79 bpm 66 bpm 77 bpm 93 bpm 73 bpm   Heart Rate (Exit) 62 bpm 56 bpm 64 bpm 61 bpm 73 bpm   Oxygen Saturation (Admit) 99 % -- -- -- --   Oxygen Saturation (Exercise) 98 % -- -- -- --   Oxygen Saturation (Exit) 98 % -- -- -- --   Rating of Perceived Exertion (Exercise) _0 Perceived Dyspnea (Exercise) 1 -- -- -- --   Symptoms Fatigue, back pain 4/10 hip pain -- none none   Comments walk test results 2nd full day of  exercise -- -- --   Duration -- Progress to 30 minutes of  aerobic without signs/symptoms of physical distress Progress to 30 minutes of  aerobic without signs/symptoms of physical distress Continue with 30 min of aerobic exercise without signs/symptoms of physical distress. Continue with 30 min of aerobic exercise without signs/symptoms of physical distress.   Intensity -- THRR unchanged THRR unchanged THRR unchanged THRR unchanged     Progression   Progression -- Continue to progress workloads to maintain intensity without signs/symptoms of  physical distress. Continue to progress workloads to maintain intensity without signs/symptoms of physical distress. Continue to progress workloads to maintain intensity without signs/symptoms of physical distress. Continue to progress workloads to maintain intensity without signs/symptoms of physical distress.   Average METs -- 2.31 3 2.5 2.1     Resistance Training   Training Prescription -- Yes Yes Yes Yes   Weight -- 3 lb 3 lb 3 lb 3 lb   Reps -- 10-15 10-15 10-15 10-15     Interval Training   Interval Training -- No No No No     Treadmill   MPH -- 1 -- -- --   Grade -- 0 -- -- --   Minutes -- 15 -- -- --   METs -- 1.8 -- -- --     NuStep   Level -- -- -- -- 3   Minutes -- -- -- -- 15   METs -- -- -- -- 2.2     REL-XR   Level -- _0 --   Minutes -- _1 --   METs -- 2._2 --     Biostep-RELP   Level -- -- -- 2 2   Minutes -- -- -- 30 15   METs -- -- -- 2 2     Home Exercise Plan   Plans to continue exercise at -- -- -- Home (comment)  walking, chair exercises Home (comment)  walking, chair exercises   Frequency -- -- -- Add 2 additional days to program exercise sessions. Add 2 additional days to program exercise sessions.   Initial Home Exercises Provided -- -- -- 02/21/21 02/21/21    Row Name 03/28/21 1700             Response to Exercise   Blood Pressure (Admit) 110/64       Blood Pressure (Exit) 102/60       Heart Rate (Admit) 60 bpm       Heart Rate (Exercise) 70 bpm       Heart Rate (Exit) 65 bpm       Rating of Perceived Exertion (Exercise) 13       Symptoms none       Duration Continue with 30 min of aerobic exercise without signs/symptoms of physical distress.       Intensity THRR unchanged         Progression   Progression Continue to progress workloads to maintain intensity without signs/symptoms of physical distress.       Average METs 2.58         Resistance Training   Training Prescription Yes       Weight 3 lb       Reps 10-15          Interval Training   Interval Training No         Recumbant Bike   Level 3       Watts 20       Minutes  15       METs 2.54         NuStep   Level 3       Minutes 15         REL-XR   Level 3       Minutes 30       METs 3.2         T5 Nustep   Level 2       Minutes 15       METs 2         Biostep-RELP   Level 2       Minutes 15       METs 2         Home Exercise Plan   Plans to continue exercise at Home (comment)  walking, chair exercises       Frequency Add 2 additional days to program exercise sessions.       Initial Home Exercises Provided 02/21/21                Exercise Comments:   Exercise Comments     Row Name 01/24/21 1353           Exercise Comments First full day of exercise!  Patient was oriented to gym and equipment including functions, settings, policies, and procedures.  Patient's individual exercise prescription and treatment plan were reviewed.  All starting workloads were established based on the results of the 6 minute walk test done at initial orientation visit.  The plan for exercise progression was also introduced and progression will be customized based on patient's performance and goals.                Exercise Goals and Review:   Exercise Goals     Row Name 01/19/21 1259             Exercise Goals   Increase Physical Activity Yes       Intervention Provide advice, education, support and counseling about physical activity/exercise needs.;Develop an individualized exercise prescription for aerobic and resistive training based on initial evaluation findings, risk stratification, comorbidities and participant's personal goals.       Expected Outcomes Short Term: Attend rehab on a regular basis to increase amount of physical activity.;Long Term: Add in home exercise to make exercise part of routine and to increase amount of physical activity.;Long Term: Exercising regularly at least 3-5 days a week.       Increase Strength  and Stamina Yes       Intervention Provide advice, education, support and counseling about physical activity/exercise needs.;Develop an individualized exercise prescription for aerobic and resistive training based on initial evaluation findings, risk stratification, comorbidities and participant's personal goals.       Expected Outcomes Short Term: Increase workloads from initial exercise prescription for resistance, speed, and METs.;Short Term: Perform resistance training exercises routinely during rehab and add in resistance training at home;Long Term: Improve cardiorespiratory fitness, muscular endurance and strength as measured by increased METs and functional capacity (6MWT)       Able to understand and use rate of perceived exertion (RPE) scale Yes       Intervention Provide education and explanation on how to use RPE scale       Expected Outcomes Short Term: Able to use RPE daily in rehab to express subjective intensity level;Long Term:  Able to use RPE to guide intensity level when exercising independently       Able to understand and  use Dyspnea scale Yes       Intervention Provide education and explanation on how to use Dyspnea scale       Expected Outcomes Short Term: Able to use Dyspnea scale daily in rehab to express subjective sense of shortness of breath during exertion;Long Term: Able to use Dyspnea scale to guide intensity level when exercising independently       Knowledge and understanding of Target Heart Rate Range (THRR) Yes       Intervention Provide education and explanation of THRR including how the numbers were predicted and where they are located for reference       Expected Outcomes Short Term: Able to state/look up THRR;Long Term: Able to use THRR to govern intensity when exercising independently;Short Term: Able to use daily as guideline for intensity in rehab       Able to check pulse independently Yes       Intervention Provide education and demonstration on how to check  pulse in carotid and radial arteries.;Review the importance of being able to check your own pulse for safety during independent exercise       Expected Outcomes Short Term: Able to explain why pulse checking is important during independent exercise;Long Term: Able to check pulse independently and accurately       Understanding of Exercise Prescription Yes       Intervention Provide education, explanation, and written materials on patient's individual exercise prescription       Expected Outcomes Short Term: Able to explain program exercise prescription;Long Term: Able to explain home exercise prescription to exercise independently                Exercise Goals Re-Evaluation :  Exercise Goals Re-Evaluation     [place] Name 01/24/21 1353 01/31/21 1101 02/15/21 1420 02/15/21 1422 02/21/21 1411     Exercise Goal Re-Evaluation   Exercise Goals Review Increase Physical Activity;Able to understand and use rate of perceived exertion (RPE) scale;Knowledge and understanding of Target Heart Rate Range (THRR);Understanding of Exercise Prescription;Able to understand and use Dyspnea scale;Able to check pulse independently;Increase Strength and Stamina Increase Physical Activity;Increase Strength and Stamina Increase Physical Activity;Increase Strength and Stamina -- Increase Physical Activity;Increase Strength and Stamina;Understanding of Exercise Prescription;Able to check pulse independently;Knowledge and understanding of Target Heart Rate Range (THRR);Able to understand and use Dyspnea scale;Able to understand and use rate of perceived exertion (RPE) scale   Comments Reviewed RPE and dyspnea scales, THR and program prescription with pt today.  Pt voiced understanding and was given a copy of goals to take home. Niccolas is doing well the first couple of sessions that he has been here for rehab. He has followed his exercise prescription thus far and will continue to progress his loads when appropriate. The  treadmill is difficult for him as he is limited by his hip pain. Will continue to monitor as he continues to attend rehab. Yarden has been doing only seated machines for the past few sessions. He has increased loads on seated machines. Staff will encourage trying the TM or walking the track again. Nainoa is doing well in rehab.  He is going to add in a third day for rehab.  Reviewed home exercise with pt today.  Pt plans to walk in the house and use staff videos for exercise.  Reviewed THR, pulse, RPE, sign and symptoms, pulse oximetery and when to call 911 or MD.  Also discussed weather considerations and indoor options.  Pt voiced understanding.   Expected  Outcomes Short: Use RPE daily to regulate intensity. Long: Follow program prescription in THR. Short: Continue current exercise prescription. Long: Increase overall MET level progressively -- Short: try walking again  Long:  improve overall stamina Short: Start to add in 3rd day Long: continue to improve stamina.    [place] Name 03/03/21 0902 03/14/21 1319 03/14/21 1321 03/25/21 1134 03/28/21 1749     Exercise Goal Re-Evaluation   Exercise Goals Review Increase Physical Activity;Increase Strength and Stamina;Understanding of Exercise Prescription Increase Physical Activity;Increase Strength and Stamina -- Increase Physical Activity;Increase Strength and Stamina;Understanding of Exercise Prescription Increase Physical Activity;Increase Strength and Stamina   Comments Shariq is doing well in rehab.  He is now coming 3 days a week.  He is doing 30 min on his seated equipment.  He does not like to walk do to his balance. -- Wilborn attends consistently adn works at [employer].  He is up to level 3 on NS.  Staff will encourage trying 4 lb for strength. Mattie is doing well in rehab. He is not doing much at home.  He is still having difficulty with his back that limits his movement. We talked about using the chair videos on YouTube as an option. He is also  considering joining  [employer] to use their equipment. Rodricus is doing well, he was out last week but returned last Friday. He is very limited doing activities because of his back pain so walking is usually not tolerated for him. He has increased to level 3 on almost all the seated machines.  We hope he can improve his loads and/or start walking again if his back pain reduces a little bit. His HR is not hitting his THRZ, however, his RPESs are in appropriate range.   Expected Outcomes Short: Continue to attend regularly Long: Continue to improve stamina. -- Short: try 4 lb Long: continue to build stamina Short: Add in more exercise at home Long; Conitnue to improve stamina. Short: Attempt to his [place] and walk again if tolerated Long: Continue to increase overall MET level            Discharge Exercise Prescription (Final Exercise Prescription Changes):  Exercise Prescription Changes - 03/28/21 1700       Response to Exercise   Blood Pressure (Admit) 110/64    Blood Pressure (Exit) 102/60    Heart Rate (Admit) 60 bpm    Heart Rate (Exercise) 70 bpm    Heart Rate (Exit) 65 bpm    Rating of Perceived Exertion (Exercise) 13    Symptoms none    Duration Continue with 30 min of aerobic exercise without signs/symptoms of physical distress.    Intensity THRR unchanged      Progression   Progression Continue to progress workloads to maintain intensity without signs/symptoms of physical distress.    Average METs 2.58      Resistance Training   Training Prescription Yes    Weight 3 lb    Reps 10-15      Interval Training   Interval Training No      Recumbant Bike   Level 3    Watts 20    Minutes 15    METs 2.54      NuStep   Level 3    Minutes 15      REL-XR   Level 3    Minutes 30    METs 3.2      T5 Nustep   Level 2    Minutes 15  METs 2      Biostep-RELP   Level 2    Minutes 15    METs 2      Home Exercise Plan   Plans to continue exercise at Home  (comment)   walking, chair exercises   Frequency Add 2 additional days to program exercise sessions.    Initial Home Exercises Provided 02/21/21             Nutrition:  Target Goals: Understanding of nutrition guidelines, daily intake of sodium <1586m, cholesterol <2037m calories 30% from fat and 7% or less from saturated fats, daily to have 5 or more servings of fruits and vegetables.  Education: All About Nutrition: -Group instruction provided by verbal, written material, interactive activities, discussions, models, and posters to present general guidelines for heart healthy nutrition including fat, fiber, MyPlate, the role of sodium in heart healthy nutrition, utilization of the nutrition label, and utilization of this knowledge for meal planning. Follow up email sent as well. Written material given at graduation.   Biometrics:  Pre Biometrics - 01/19/21 1245       Pre Biometrics   Height 5' 11.5" (1.816 m)    Weight 189 lb 11.2 oz (86 kg)    BMI (Calculated) 26.09    Single Leg Stand 2.3 seconds              Nutrition Therapy Plan and Nutrition Goals:  Nutrition Therapy & Goals - 01/31/21 1327       Nutrition Therapy   Diet Heart healthy, low Na, T2DM    Drug/Food Interactions Statins/Certain Fruits    Protein (specify units) 70g    Fiber 30 grams    Whole Grain Foods 3 servings    Saturated Fats 12 max. grams    Fruits and Vegetables 8 servings/day    Sodium 1.5 grams      Personal Nutrition Goals   Nutrition Goal ST: eat salads at least 3x/week (Murvin suggested this would be a good option as he enjoys salads), make sure dressing has <5g saturated fat per serving LT: limit going out to eat <2-3x/week, limit saturated fat <12-16g/day, include 5 fruit/vegetable servings per day    Comments [age].o. M admitted to cardiac rehab s/p stent placement also presents with T2DM, HLD, HTN, GERD, CAD, NSTEMI (12/20/20). Last A1c 6.6%. PYP: [id number] reports that he does  not cook a lot; his wife needs two hip replacements. He can stand next to the counter, but not to prepare a full meal - he doesn't normally cook either. B: cereal (frosted flakes, raisin bran, frosted mini wheats, cheerios) - 2% milk. Decaf coffee with splenda. S: biscotti cookie with peanut butter L: sandwich (chicken salad on sourdough today), pimento cheese, roast beef, ham, chicken salad on white wheat. May eat one cookie. S: clementines  D: sometimes another sandwich, take out - chicken usually or fish restaurant (potato), sometimes a chicken salad. S: ice cream (a couple of scoops) Drinks: waSports coachsometimes a sierra mist. He tries to limit his caffiene. He reports feeling that his diet needs some changes and is willing to do what he can while cooking very little. Discussed with patient that cooking at home can allow for more control over what he is eating - offered to help with meal planning and cooking strategies to avoid fatigue such as sitting down while chopping vegetables. Discussed heart healthy eating and diabetes friendly eating.      Intervention Plan   Intervention Prescribe, educate  and counsel regarding individualized specific dietary modifications aiming towards targeted core components such as weight, hypertension, lipid management, diabetes, heart failure and other comorbidities.    Expected Outcomes Short Term Goal: Understand basic principles of dietary content, such as calories, fat, sodium, cholesterol and nutrients.;Short Term Goal: A plan has been developed with personal nutrition goals set during dietitian appointment.;Long Term Goal: Adherence to prescribed nutrition plan.             Nutrition Assessments:  MEDIFICTS Score Key: ?70 Need to make dietary changes  40-70 Heart Healthy Diet ? 40 Therapeutic Level Cholesterol Diet  Flowsheet Row Cardiac Rehab from 01/19/2021 in [facility] Cardiac and Pulmonary Rehab  Picture Your Plate Total Score on Admission 51       Picture Your Plate Scores: <28 Unhealthy dietary pattern with much room for improvement. 41-50 Dietary pattern unlikely to meet recommendations for good health and room for improvement. 51-60 More healthful dietary pattern, with some room for improvement.  >60 Healthy dietary pattern, although there may be some specific behaviors that could be improved.    Nutrition Goals Re-Evaluation:  Nutrition Goals Re-Evaluation     [place] Name 02/21/21 1415 03/25/21 1138           Goals   Nutrition Goal ST: eat salads at least 3x/week (Estanislado suggested this would be a good option as he enjoys salads), make sure dressing has <5g saturated fat per serving LT: limit going out to eat <2-3x/week, limit saturated fat <12-16g/day, include 5 fruit/vegetable servings per day Short: Order more stuff for salads Long: Continue to improve diet variety      Comment Aroldo is doing well in rehab.  He is trying to eat more salads.  His next step is to get his wife to order more stuff for salad.  He still likes his ice cream.  He is trying to cut back on it.  He is trying to get in more fruits and vegetables now. Deveion is doing well with his diet.  They are still eating lots of salads and he is tyring to mix it up some each day.  He has cut back a little with his ice cream and got some no sugar added ice cream.  He says he can taste a difference btut it is palatable.  He is doing okay with getting in more fruit.      Expected Outcome Short: Order more stuff for salads Long: Continue to improve diet variety Short: Continue to aim for variety in his salad lOng; Conitnue to focus on heart healthy diet..               Nutrition Goals Discharge (Final Nutrition Goals Re-Evaluation):  Nutrition Goals Re-Evaluation - 03/25/21 1138       Goals   Nutrition Goal Short: Order more stuff for salads Long: Continue to improve diet variety    Comment Parrish is doing well with his diet.  They are still eating lots of salads  and he is tyring to mix it up some each day.  He has cut back a little with his ice cream and got some no sugar added ice cream.  He says he can taste a difference btut it is palatable.  He is doing okay with getting in more fruit.    Expected Outcome Short: Continue to aim for variety in his salad lOng; Conitnue to focus on heart healthy diet.[name]             Psychosocial: Target  Goals: Acknowledge presence or absence of significant depression and/or stress, maximize coping skills, provide positive support system. Participant is able to verbalize types and ability to use techniques and skills needed for reducing stress and depression.   Education: Stress, Anxiety, and Depression - Group verbal and visual presentation to define topics covered.  Reviews how body is impacted by stress, anxiety, and depression.  Also discusses healthy ways to reduce stress and to treat/manage anxiety and depression.  Written material given at graduation. Flowsheet Row Cardiac Rehab from 03/30/2021 in [facility] Cardiac and Pulmonary Rehab  Date 03/30/21  Educator AS  Instruction Review Code 1- Verbalizes Understanding       Education: Sleep Hygiene -Provides group verbal and written instruction about how sleep can affect your health.  Define sleep hygiene, discuss sleep cycles and impact of sleep habits. Review good sleep hygiene tips.    Initial Review & Psychosocial Screening:  Initial Psych Review & Screening - 12/31/20 1315       Initial Review   Current issues with Current Sleep Concerns      Family Dynamics   Good Support System? Yes   Wife, daughter, neighbors     Barriers   Psychosocial barriers to participate in program There are no identifiable barriers or psychosocial needs.;The patient should benefit from training in stress management and relaxation.      Screening Interventions   Interventions Encouraged to exercise;Provide feedback about the scores to participant;To provide support and  resources with identified psychosocial needs    Expected Outcomes Short Term goal: Utilizing psychosocial counselor, staff and physician to assist with identification of specific Stressors or current issues interfering with healing process. Setting desired goal for each stressor or current issue identified.;Long Term Goal: Stressors or current issues are controlled or eliminated.;Short Term goal: Identification and review with participant of any Quality of Life or Depression concerns found by scoring the questionnaire.;Long Term goal: The participant improves quality of Life and PHQ9 Scores as seen by post scores and/or verbalization of changes             Quality of Life Scores:   Quality of Life - 01/19/21 1255       Quality of Life   Select Quality of Life      Quality of Life Scores   Health/Function Pre 14.6 %    Socioeconomic Pre 28 %    Psych/Spiritual Pre 29.64 %    Family Pre 15.6 %    GLOBAL Pre 20.14 %            Scores of 19 and below usually indicate a poorer quality of life in these areas.  A difference of  2-3 points is a clinically meaningful difference.  A difference of 2-3 points in the total score of the Quality of Life Index has been associated with significant improvement in overall quality of life, self-image, physical symptoms, and general health in studies assessing change in quality of life.  PHQ-9: Recent Review Flowsheet Data     Depression screen [facility] 2/9 04/04/2021 01/19/2021 10/25/2020 09/10/2020 02/26/2020   Decreased Interest 0 0 0 0 0   Down, Depressed, Hopeless 0 0 0 0 0   PHQ - 2 Score 0 0 0 0 0   Altered sleeping 0 0 - - -   Tired, decreased energy 1 2 - - -   Change in appetite 0 0 - - -   Feeling bad or failure about yourself  0 0 - - -  Trouble concentrating 0 0 - - -   Moving slowly or fidgety/restless 0 0 - - -   Suicidal thoughts 0 0 - - -   PHQ-9 Score 1 2 - - -   Difficult doing work/chores - Not difficult at all - - -       Interpretation of Total Score  Total Score Depression Severity:  1-4 = Minimal depression, 5-9 = Mild depression, 10-14 = Moderate depression, 15-19 = Moderately severe depression, 20-27 = Severe depression   Psychosocial Evaluation and Intervention:  Psychosocial Evaluation - 12/31/20 1326       Psychosocial Evaluation & Interventions   Interventions Encouraged to exercise with the program and follow exercise prescription    Comments Mr. 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 His sleep has been altered due to his shortness of breath while lying down. He is in contact with his doctors and sees his hematologist next week to check up on his anemia.  He states the heart issues came as a surprise to him so he is motivated to work on his health. His friend did the program a while back and is encouraging him to participate as well. His wife and daughter (who moved in with them after their latest health issues) are very supportive. 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 It makes it hard for him to walk and participate in things. He is hopeful this program will help boost his stamina and help create a plan for future exercise and heart healthy living.    Expected Outcomes Short: attend cardiac rehab for education and exercise. Long: develop and maintain positive self care habits.    Continue Psychosocial Services  Follow up required by staff             Psychosocial Re-Evaluation:  Psychosocial Re-Evaluation     [place] Name 02/21/21 1412 03/25/21 1136           Psychosocial Re-Evaluation   Current issues with Current Stress Concerns;Current Sleep Concerns Current Stress Concerns;Current Sleep Concerns      Comments Graig is doing well in rehab.  He does not have any major stressor other than his health.  He gets frustrated by not being able to do what he wants to do. He has a nerve stimulator for his back which  aggravates him along with his legs.  He has a hard time differeniating  between his pain and nueropathy.  He is sleeping better now, but still wakes frequently to go bathroom.  His surgery has made his bladder erratic which disrupts his sleep. His wife needs hip replacement and he tries to help her.  His daughter has been living with them as she recovers from rotator cuff surgery. Simmie is doing well in rehab.  His back is still limiting him.  He is also concerned about his wife's health and need for hip replacement. He is sleeping pretty and still needing to go to bathroom frequently. His daughter is still living with them and she is doing well post surgery.      Expected Outcomes Short: Continue to move as much as possible Long: Continue to focus on the positive Short: Continue to move more Long: Continue to stay positive      Interventions Stress management education;Encouraged to attend Cardiac Rehabilitation for the exercise --      Continue Psychosocial Services  Follow up required by staff --  Psychosocial Discharge (Final Psychosocial Re-Evaluation):  Psychosocial Re-Evaluation - 03/25/21 1136       Psychosocial Re-Evaluation   Current issues with Current Stress Concerns;Current Sleep Concerns    Comments Olivia is doing well in rehab.  His back is still limiting him.  He is also concerned about his wife's health and need for hip replacement. He is sleeping pretty and still needing to go to bathroom frequently. His daughter is still living with them and she is doing well post surgery.    Expected Outcomes Short: Continue to move more Long: Continue to stay positive             Vocational Rehabilitation: Provide vocational rehab assistance to qualifying candidates.   Vocational Rehab Evaluation & Intervention:  Vocational Rehab - 12/31/20 1312       Initial Vocational Rehab Evaluation & Intervention   Assessment shows need for Vocational Rehabilitation No              Education: Education Goals: Education classes will be provided on a variety of topics geared toward better understanding of heart health and risk factor modification. Participant will state understanding/return demonstration of topics presented as noted by education test scores.  Learning Barriers/Preferences:  Learning Barriers/Preferences - 12/31/20 1312       Learning Barriers/Preferences   Learning Barriers None    Learning Preferences None             General Cardiac Education Topics:  AED/CPR: - Group verbal and written instruction with the use of models to demonstrate the basic use of the AED with the basic ABC's of resuscitation.   Anatomy and Cardiac Procedures: - Group verbal and visual presentation and models provide information about basic cardiac anatomy and function. Reviews the testing methods done to diagnose heart disease and the outcomes of the test results. Describes the treatment choices: Medical Management, Angioplasty, or Coronary Bypass Surgery for treating various heart conditions including Myocardial Infarction, Angina, Valve Disease, and Cardiac Arrhythmias.  Written material given at graduation. Flowsheet Row Cardiac Rehab from 03/30/2021 in [facility] Cardiac and Pulmonary Rehab  Date 02/16/21  Educator SB  Instruction Review Code 1- Verbalizes Understanding       Medication Safety: - Group verbal and visual instruction to review commonly prescribed medications for heart and lung disease. Reviews the medication, class of the drug, and side effects. Includes the steps to properly store meds and maintain the prescription regimen.  Written material given at graduation. Flowsheet Row Cardiac Rehab from 03/30/2021 in [facility] Cardiac and Pulmonary Rehab  Date 03/02/21  Educator [facility]  Instruction Review Code 1- Verbalizes Understanding       Intimacy: - Group verbal instruction through game format to discuss how heart and lung disease can affect  sexual intimacy. Written material given at graduation.. Flowsheet Row Cardiac Rehab from 03/30/2021 in [facility] Cardiac and Pulmonary Rehab  Date 02/09/21  Educator AS  Instruction Review Code 1- Verbalizes Understanding       Know Your Numbers and Heart Failure: - Group verbal and visual instruction to discuss disease risk factors for cardiac and pulmonary disease and treatment options.  Reviews associated critical values for Overweight/Obesity, Hypertension, Cholesterol, and Diabetes.  Discusses basics of heart failure: signs/symptoms and treatments.  Introduces Heart Failure Zone chart for action plan for heart failure.  Written material given at graduation. Flowsheet Row Cardiac Rehab from 03/30/2021 in [facility] Cardiac and Pulmonary Rehab  Education need identified 01/19/21       Infection Prevention: -  Provides verbal and written material to individual with discussion of infection control including proper hand washing and proper equipment cleaning during exercise session. Flowsheet Row Cardiac Rehab from 03/30/2021 in [facility] Cardiac and Pulmonary Rehab  Education need identified 01/19/21  Date 01/19/21  Educator [place]  Instruction Review Code 1- Verbalizes Understanding       Falls Prevention: - Provides verbal and written material to individual with discussion of falls prevention and safety. Flowsheet Row Cardiac Rehab from 03/30/2021 in [facility] Cardiac and Pulmonary Rehab  Education need identified 01/19/21  Date 01/19/21  Educator [place]  Instruction Review Code 1- Verbalizes Understanding       Other: -Provides group and verbal instruction on various topics (see comments)   Knowledge Questionnaire Score:  Knowledge Questionnaire Score - 01/19/21 1244       Knowledge Questionnaire Score   Pre Score 22/26: MI, Nitro, Exercise, A&P             Core Components/Risk Factors/Patient Goals at Admission:  Personal Goals and Risk Factors at Admission - 01/19/21 1300       Core  Components/Risk Factors/Patient Goals on Admission    Weight Management Yes;Weight Maintenance    Intervention Weight Management: Develop a combined nutrition and exercise program designed to reach desired caloric intake, while maintaining appropriate intake of nutrient and fiber, sodium and fats, and appropriate energy expenditure required for the weight goal.;Weight Management: Provide education and appropriate resources to help participant work on and attain dietary goals.;Weight Management/Obesity: Establish reasonable short term and long term weight goals.    Admit Weight 189 lb (85.7 kg)    Goal Weight: Short Term 189 lb (85.7 kg)    Goal Weight: Long Term 189 lb (85.7 kg)    Expected Outcomes Short Term: Continue to assess and modify interventions until short term weight is achieved;Long Term: Adherence to nutrition and physical activity/exercise program aimed toward attainment of established weight goal;Weight Maintenance: Understanding of the daily nutrition guidelines, which includes 25-35% calories from fat, 7% or less cal from saturated fats, less than 243m cholesterol, less than 1.5gm of sodium, & 5 or more servings of fruits and vegetables daily;Understanding recommendations for meals to include 15-35% energy as protein, 25-35% energy from fat, 35-60% energy from carbohydrates, less than 2055mof dietary cholesterol, 20-35 gm of total fiber daily;Understanding of distribution of calorie intake throughout the day with the consumption of 4-5 meals/snacks    Diabetes Yes    Intervention Provide education about signs/symptoms and action to take for hypo/hyperglycemia.;Provide education about proper nutrition, including hydration, and aerobic/resistive exercise prescription along with prescribed medications to achieve blood glucose in normal ranges: Fasting glucose 65-99 mg/dL    Expected Outcomes Short Term: Participant verbalizes understanding of the signs/symptoms and immediate care of  hyper/hypoglycemia, proper foot care and importance of medication, aerobic/resistive exercise and nutrition plan for blood glucose control.;Long Term: Attainment of HbA1C < 7%.    Hypertension Yes    Intervention Provide education on lifestyle modifcations including regular physical activity/exercise, weight management, moderate sodium restriction and increased consumption of fresh fruit, vegetables, and low fat dairy, alcohol moderation, and smoking cessation.;Monitor prescription use compliance.    Expected Outcomes Short Term: Continued assessment and intervention until BP is < 140/9010mG in hypertensive participants. < 130/46m59m in hypertensive participants with diabetes, heart failure or chronic kidney disease.;Long Term: Maintenance of blood pressure at goal levels.    Lipids Yes    Intervention Provide education and support for participant on nutrition &  aerobic/resistive exercise along with prescribed medications to achieve LDL <37m, HDL >457m    Expected Outcomes Short Term: Participant states understanding of desired cholesterol values and is compliant with medications prescribed. Participant is following exercise prescription and nutrition guidelines.;Long Term: Cholesterol controlled with medications as prescribed, with individualized exercise RX and with personalized nutrition plan. Value goals: LDL < 7014mHDL > 40 mg.             Education:Diabetes - Individual verbal and written instruction to review signs/symptoms of diabetes, desired ranges of glucose level fasting, after meals and with exercise. Acknowledge that pre and post exercise glucose checks will be done for 3 sessions at entry of program. [facility]  Education need identified 01/19/21  Date 01/19/21  Educator KL Lorimornstruction Review Code 1- Verbalizes Understanding       Core Components/Risk Factors/Patient Goals Review:   Goals and Risk  Factor Review     Row Name 02/21/21 1417 03/25/21 1140           Core Components/Risk Factors/Patient Goals Review   Personal Goals Review Weight Management/Obesity;Hypertension;Diabetes;Lipids Weight Management/Obesity;Hypertension;Diabetes;Lipids      Review WilJakaree doing well in rehab. His weight has been holding steady.  He feels good here.  His sugars are doing well and he checks it about 3x a week and averages between 120-130 mg/dl.  His pressures have been up and down throughout the day.  He has found that early in the morning he runs high  but comes back down after breakfast and some moving.  He takes his meds at night.  He is only taking metformin in the morning and feeling good. WilLong doing well with his weight and staying steady.  His sugars are still doing well and he continues to check them.  His pressures are still doing well.  His cuff is reading high at home compared to what we are getting in class.  We talked about bringing it in to class to check.  He continues to do well with his meds.      Expected Outcomes Short: Continue to keep close eye on blood pressures Long: continue to monitor risk factors. Short: Bring in cuff to check pressures next week Long: Conitnue to monitor risk factors.               Core Components/Risk Factors/Patient Goals at Discharge (Final Review):   Goals and Risk Factor Review - 03/25/21 1140       Core Components/Risk Factors/Patient Goals Review   Personal Goals Review Weight Management/Obesity;Hypertension;Diabetes;Lipids    Review WilMohamud doing well with his weight and staying steady.  His sugars are still doing well and he continues to check them.  His pressures are still doing well.  His cuff is reading high at home compared to what we are getting in class.  We talked about bringing it in to class to check.  He continues to do well with his meds.    Expected Outcomes Short: Bring in cuff to check pressures next week Long: Conitnue to  monitor risk factors.             ITP Comments:  ITP Comments     Row Name 12/31/20 1303 01/19/21 1309 01/24/21 1353 01/31/21 1432 02/09/21 0840   ITP Comments Initial telephone orientation completed. [facility] EP orientation scheduled for Wednesday 12/7 at 11am. Completed 6MWT and gym orientation.  Initial ITP created and sent for review to Dr. Emily Filbert, Medical Director. First full day of exercise!  Patient was oriented to gym and equipment including functions, settings, policies, and procedures.  Patient's individual exercise prescription and treatment plan were reviewed.  All starting workloads were established based on the results of the 6 minute walk test done at initial orientation visit.  The plan for exercise progression was also introduced and progression will be customized based on patient's performance and goals. Completed initial RD Consultation 30 Day review completed. Medical Director ITP review done, changes made as directed, and signed approval by Medical Director.    [place] Name 03/09/21 [phone] 04/06/21 0917         ITP Comments 30 Day review completed. Medical Director ITP review done, changes made as directed, and signed approval by Medical Director. 30 Day review completed. Medical Director ITP review done, changes made as directed, and signed approval by Medical Director.               Comments:

## 2021-04-11 ENCOUNTER — Other Ambulatory Visit: Payer: Self-pay

## 2021-04-11 DIAGNOSIS — I214 Non-ST elevation (NSTEMI) myocardial infarction: Secondary | ICD-10-CM

## 2021-04-11 DIAGNOSIS — Z955 Presence of coronary angioplasty implant and graft: Secondary | ICD-10-CM | POA: Diagnosis not present

## 2021-04-11 DIAGNOSIS — I252 Old myocardial infarction: Secondary | ICD-10-CM | POA: Diagnosis not present

## 2021-04-11 NOTE — Progress Notes (Signed)
Daily Session Note  Patient Details  Name: Muhammadali Ries. MRN: [id number] Date of Birth: 18-Mar-1940 Referring Provider:   Flowsheet Row Cardiac Rehab from 01/19/2021 in [facility] Cardiac and Pulmonary Rehab  Referring Provider [name] MD       Encounter Date: 04/11/2021  Check In:  Session Check In - 04/11/21 1356       Check-In   Supervising physician immediately available to respond to emergencies See telemetry face sheet for immediately available ER MD    Location [facility]-Cardiac & Pulmonary Rehab    Staff Present Birdie Sons, MPA, RN;Joseph Lou Miner, MS, ASCM CEP, Exercise Physiologist    Virtual Visit No    Medication changes reported     No    Fall or balance concerns reported    No    Tobacco Cessation No Change    Warm-up and Cool-down Performed on first and last piece of equipment    Resistance Training Performed Yes    VAD Patient? No    PAD/SET Patient? No      Pain Assessment   Currently in Pain? No/denies                Social History   Tobacco Use  Smoking Status Never  Smokeless Tobacco Never    Goals Met:  Independence with exercise equipment Exercise tolerated well No report of concerns or symptoms today Strength training completed today  Goals Unmet:  Not Applicable  Comments: Pt able to follow exercise prescription today without complaint.  Will continue to monitor for progression.    Dr. Emily Filbert is Medical Director for [place].  Dr. Ottie Glazier is Medical Director for [facility] Pulmonary Rehabilitation.

## 2021-04-13 ENCOUNTER — Other Ambulatory Visit: Payer: Self-pay

## 2021-04-13 ENCOUNTER — Encounter: Payer: Medicare Other | Attending: Cardiology

## 2021-04-13 DIAGNOSIS — I214 Non-ST elevation (NSTEMI) myocardial infarction: Secondary | ICD-10-CM | POA: Insufficient documentation

## 2021-04-13 DIAGNOSIS — Z955 Presence of coronary angioplasty implant and graft: Secondary | ICD-10-CM | POA: Insufficient documentation

## 2021-04-13 NOTE — Progress Notes (Signed)
Daily Session Note ? ?Patient Details  ?Name: Sean Reyes. ?MRN: [id number] ?Date of Birth: [birth year] ?Referring Provider:   ?Flowsheet Row Cardiac Rehab from 01/19/2021 in [facility] Cardiac and Pulmonary Rehab  ?Referring Provider [name] MD  ? ?  ? ? ?Encounter Date: 04/13/2021 ? ?Check In: ? Session Check In - 04/13/21 1352   ? ?  ? Check-In  ? Supervising physician immediately available to respond to emergencies See telemetry face sheet for immediately available ER MD   ? Location [facility]-Cardiac & Pulmonary Rehab   ? Staff Present Birdie Sons, MPA, Nino Glow, MS, ASCM CEP, Exercise Physiologist;Joseph [place], [place]   ? Virtual Visit No   ? Medication changes reported     No   ? Fall or balance concerns reported    No   ? Tobacco Cessation No Change   ? Warm-up and Cool-down Performed on first and last piece of equipment   ? Resistance Training Performed Yes   ? VAD Patient? No   ? PAD/SET Patient? No   ?  ? Pain Assessment  ? Currently in Pain? No/denies   ? ?  ?  ? ?  ? ? ? ? ? ?Social History  ? ?Tobacco Use  ?Smoking Status Never  ?Smokeless Tobacco Never  ? ? ?Goals Met:  ?Independence with exercise equipment ?Exercise tolerated well ?No report of concerns or symptoms today ?Strength training completed today ? ?Goals Unmet:  ?Not Applicable ? ?Comments: Pt able to follow exercise prescription today without complaint.  Will continue to monitor for progression. ? ? ? ?Dr. Emily Filbert is Medical Director for [place].  ?Dr. Ottie Glazier is Medical Director for [facility] Pulmonary Rehabilitation. ?

## 2021-04-15 ENCOUNTER — Other Ambulatory Visit: Payer: Self-pay

## 2021-04-15 ENCOUNTER — Encounter: Payer: Medicare Other | Admitting: *Deleted

## 2021-04-15 DIAGNOSIS — I214 Non-ST elevation (NSTEMI) myocardial infarction: Secondary | ICD-10-CM | POA: Diagnosis not present

## 2021-04-15 DIAGNOSIS — Z955 Presence of coronary angioplasty implant and graft: Secondary | ICD-10-CM

## 2021-04-15 NOTE — Progress Notes (Signed)
Daily Session Note ? ?Patient Details  ?Name: Sean Reyes. ?MRN: [id number] ?Date of Birth: 12-14-1940 ?Referring Provider:   ?Flowsheet Row Cardiac Rehab from 01/19/2021 in [facility] Cardiac and Pulmonary Rehab  ?Referring Provider [name] MD  ? ?  ? ? ?Encounter Date: 04/15/2021 ? ?Check In: ? Session Check In - 04/15/21 1100   ? ?  ? Check-In  ? Supervising physician immediately available to respond to emergencies See telemetry face sheet for immediately available ER MD   ? Location [facility]-Cardiac & Pulmonary Rehab   ? Staff Present Renita Papa, RN BSN;Joseph Guys, RCP,RRT,BSRT;Jessica Shiro, [place], [place], [place], CCET   ? Virtual Visit No   ? Medication changes reported     No   ? Fall or balance concerns reported    No   ? Warm-up and Cool-down Performed on first and last piece of equipment   ? Resistance Training Performed Yes   ? VAD Patient? No   ? PAD/SET Patient? No   ?  ? Pain Assessment  ? Currently in Pain? No/denies   ? ?  ?  ? ?  ? ? ? ? ? ?Social History  ? ?Tobacco Use  ?Smoking Status Never  ?Smokeless Tobacco Never  ? ? ?Goals Met:  ?Independence with exercise equipment ?Exercise tolerated well ?No report of concerns or symptoms today ?Strength training completed today ? ?Goals Unmet:  ?Not Applicable ? ?Comments: Pt able to follow exercise prescription today without complaint.  Will continue to monitor for progression. ? ? ? ?Dr. Emily Filbert is Medical Director for [place].  ?Dr. Ottie Glazier is Medical Director for [facility] Pulmonary Rehabilitation. ?

## 2021-04-18 ENCOUNTER — Other Ambulatory Visit: Payer: Self-pay

## 2021-04-18 DIAGNOSIS — I214 Non-ST elevation (NSTEMI) myocardial infarction: Secondary | ICD-10-CM | POA: Diagnosis not present

## 2021-04-18 DIAGNOSIS — Z955 Presence of coronary angioplasty implant and graft: Secondary | ICD-10-CM | POA: Diagnosis not present

## 2021-04-18 NOTE — Progress Notes (Signed)
Daily Session Note ? ?Patient Details  ?Name: Sean Reyes. ?MRN: [id number] ?Date of Birth: 04-18-1940 ?Referring Provider:   ?Flowsheet Row Cardiac Rehab from 01/19/2021 in [facility] Cardiac and Pulmonary Rehab  ?Referring Provider [name] MD  ? ?  ? ? ?Encounter Date: 04/18/2021 ? ?Check In: ? Session Check In - 04/18/21 1405   ? ?  ? Check-In  ? Supervising physician immediately available to respond to emergencies See telemetry face sheet for immediately available ER MD   ? Location [facility]-Cardiac & Pulmonary Rehab   ? Staff Present Birdie Sons, MPA, Nino Glow, MS, ASCM CEP, Exercise Physiologist;Joseph [place], [place]   ? Virtual Visit No   ? Medication changes reported     No   ? Fall or balance concerns reported    No   ? Tobacco Cessation No Change   ? Warm-up and Cool-down Performed on first and last piece of equipment   ? Resistance Training Performed Yes   ? VAD Patient? No   ? PAD/SET Patient? No   ?  ? Pain Assessment  ? Currently in Pain? No/denies   ? ?  ?  ? ?  ? ? ? ? ? ?Social History  ? ?Tobacco Use  ?Smoking Status Never  ?Smokeless Tobacco Never  ? ? ?Goals Met:  ?Independence with exercise equipment ?Exercise tolerated well ?No report of concerns or symptoms today ?Strength training completed today ? ?Goals Unmet:  ?Not Applicable ? ?Comments: Pt able to follow exercise prescription today without complaint.  Will continue to monitor for progression. ? ? ? ?Dr. Emily Filbert is Medical Director for [place].  ?Dr. Ottie Glazier is Medical Director for [facility] Pulmonary Rehabilitation. ?

## 2021-04-19 ENCOUNTER — Telehealth: Payer: Medicare Other

## 2021-04-19 ENCOUNTER — Ambulatory Visit (INDEPENDENT_AMBULATORY_CARE_PROVIDER_SITE_OTHER): Payer: Medicare Other

## 2021-04-19 ENCOUNTER — Telehealth: Payer: Self-pay

## 2021-04-19 DIAGNOSIS — E1143 Type 2 diabetes mellitus with diabetic autonomic (poly)neuropathy: Secondary | ICD-10-CM

## 2021-04-19 DIAGNOSIS — I480 Paroxysmal atrial fibrillation: Secondary | ICD-10-CM

## 2021-04-19 DIAGNOSIS — I152 Hypertension secondary to endocrine disorders: Secondary | ICD-10-CM

## 2021-04-19 DIAGNOSIS — L905 Scar conditions and fibrosis of skin: Secondary | ICD-10-CM | POA: Diagnosis not present

## 2021-04-19 DIAGNOSIS — E1159 Type 2 diabetes mellitus with other circulatory complications: Secondary | ICD-10-CM

## 2021-04-19 DIAGNOSIS — E785 Hyperlipidemia, unspecified: Secondary | ICD-10-CM

## 2021-04-19 DIAGNOSIS — G894 Chronic pain syndrome: Secondary | ICD-10-CM

## 2021-04-19 DIAGNOSIS — M79661 Pain in right lower leg: Secondary | ICD-10-CM

## 2021-04-19 DIAGNOSIS — E1169 Type 2 diabetes mellitus with other specified complication: Secondary | ICD-10-CM

## 2021-04-19 DIAGNOSIS — R208 Other disturbances of skin sensation: Secondary | ICD-10-CM | POA: Diagnosis not present

## 2021-04-19 DIAGNOSIS — M542 Cervicalgia: Secondary | ICD-10-CM

## 2021-04-19 NOTE — Telephone Encounter (Signed)
?  Care Management  ? ?Follow Up Note ? ? ?04/19/2021 ?Name: Sean Reyes. MRN: [id number] DOB: 12-06-1940 ? ? ?Referred by: [name], NP ?Reason for referral : Chronic Care Management (RNCM: Follow up for Chronic Disease Management and Care Coordination Needs ) ? ? ?RNCM called patient again and was able to connect with the patient. See new encounter.  ? ?Follow Up Plan: Telephone follow up appointment with care management team member scheduled for: 06-21-2021 at 0945 am ? ?Noreene Larsson RN, MSN, CCM ?Community Care Coordinator ?[facility] ?[place] ?Mobile: [phone]  ?

## 2021-04-19 NOTE — Patient Instructions (Signed)
Visit Information  Thank you for taking time to visit with me today. Please don't hesitate to contact me if I can be of assistance to you before our next scheduled telephone appointment.  Following are the goals we discussed today:  RNCM Clinical Goal(s):  Patient will verbalize understanding of plan for management of HTN, HLD, AFIB, DMII, and Chronic pain as evidenced by compliance with plan of care, calling the office for changes, and working with the CCM team to effectively manage health and well being  take all medications exactly as prescribed and will call provider for medication related questions as evidenced by compliance with medications and calling for refills before running out    attend all scheduled medical appointments: 10-03-2021 at 1 pm as evidenced by keeping appointments and calling for rescheduling needs         demonstrate improved and ongoing health management independence as evidenced by working with the CCM team to effectively manage health and well being         demonstrate a decrease in HTN, HLD, AFIB, DMII, and chronic pain  exacerbations  as evidenced by stable conditions, no acute changes, and working with the CCM team to effectively manage health and well being  demonstrate ongoing self health care management ability for effective management of chronic condtions  as evidenced by working with the CCM team  through collaboration with Consulting civil engineer, provider, and care team.    Interventions: 1:1 collaboration with primary care provider regarding development and update of comprehensive plan of care as evidenced by provider attestation and co-signature Inter-disciplinary care team collaboration (see longitudinal plan of care) Evaluation of current treatment plan related to  self management and patient's adherence to plan as established by provider     SDOH Barriers (Status: Goal on Track (progressing): YES.) Long Term Goal  Patient interviewed and SDOH assessment  performed        Patient interviewed and appropriate assessments performed Provided patient with information about resources available in the community and care guides to assist with changes in SDOH, or new needs  Discussed plans with patient for ongoing care management follow up and provided patient with direct contact information for care management team Advised patient to call the office for changes in [place], questions, or concerns       Diabetes:  (Status: Goal on Track (progressing): YES.) Long Term Goal         Lab Results  Component Value Date    HGBA1C 6.4 (H) 04/04/2021  Previous 6.6 Assessed patient's understanding of A1c goal: <7% Provided education to patient about basic DM disease process; Reviewed medications with patient and discussed importance of medication adherence. 04-19-2021: The patient states compliance with medications        Reviewed prescribed diet with patient Heart Healthy/ADA diet. 04-19-2021: The patient is compliant with heart healthy/ADA diet- reviewed Counseled on importance of regular laboratory monitoring as prescribed. 12-27-  2022: The patient has regular lab work. Review of November labs.  04-19-2021: The patient has regular lab work and is compliant with the plan of care for his DM     Discussed plans with patient for ongoing care management follow up and provided patient with direct contact information for care management team;      Provided patient with written educational materials related to hypo and hyperglycemia and importance of correct treatment. 04-19-2021:  Knows how to handle high and low blood sugars. DM is stable at this time.  Reviewed scheduled/upcoming provider appointments including: 10-03-2021 at 1 PM      Advised patient, providing education and rationale, to check cbg as directed  and record        call provider for findings outside established parameters;       Review of patient status, including review of consultants reports,  relevant laboratory and other test results, and medications completed;         Hyperlipidemia:  (Status: Goal on Track (progressing): YES.) Long Term Goal       Lab Results  Component Value Date    CHOL 101 04/04/2021    HDL 40 04/04/2021    LDLCALC 43 04/04/2021    TRIG 96 04/04/2021    CHOLHDL 2.6 12/21/2020      Medication review performed; medication list updated in electronic medical record.  Provider established cholesterol goals reviewed 04-19-2021: Reviewed and praised the patient for being at goal. Counseled on importance of regular laboratory monitoring as prescribed. 04-19-2021: The patient has regular lab work done.  Provided HLD educational materials; Reviewed role and benefits of statin for ASCVD risk reduction; Discussed strategies to manage statin-induced myalgias; Reviewed importance of limiting foods high in cholesterol. 04-19-2021: The patient is at goal. No new issues related to following a heart healthy/ADA diet  Reviewed exercise goals and target of 150 minutes per week; Screening for signs and symptoms of depression related to chronic disease state;  Assessed social determinant of health barriers;    Hypertension: (Status: Goal on Track (progressing): YES.) Last practice recorded BP readings:     BP Readings from Last 3 Encounters:  04/04/21 118/74  03/09/21 110/66  02/03/21 127/71  Most recent eGFR/CrCl:       Lab Results  Component Value Date    EGFR 48 (L) 04/04/2021    No components found for: CRCL   Evaluation of current treatment plan related to hypertension self management and patient's adherence to plan as established by provider.  02-08-2021: The patient is concerned that his blood pressure is elevated first thing in the am around 170/90's in the am and after he sits down it will drop to 101/60. Denies pain or being anxious first thing in the am. Education on the goal of systolic <916 and diastolic <38. Also review of medications and if he is taking  them correctly. The patient states compliance with bp medications. The patient may need to follow up with the cardiologist as he ran out of his medications for his AFIB and he can tell his heart is out of rhythm. Education and support given. See AFIB plan of care for more details. 04-19-2021: The patient continues to do cardiac rehab and feels like he is doing well with heart healthy. HTN and blood pressure readings have stabilized. The patient denies any acute needs at this time.  Provided education to patient re: stroke prevention, s/s of heart attack and stroke. 04-19-2021:: Review of elevations in blood pressure putting the patient at higher risk of heart attack and stroke; Reviewed prescribed diet heart healthy/ADA diet. 04-19-2021 The patient states that he is compliant.  Reviewed medications with patient and discussed importance of compliance. 04-19-2021 States compliance with blood pressure medications.   Discussed plans with patient for ongoing care management follow up and provided patient with direct contact information for care management team; Advised patient, providing education and rationale, to monitor blood pressure daily and record, calling PCP for findings outside established parameters;  Advised patient to discuss recent hospitalization  with provider;  Provided education on prescribed diet heart healthy diet. 04-19-2021: Review and education provided;  Discussed complications of poorly controlled blood pressure such as heart disease, stroke, circulatory complications, vision complications, kidney impairment, sexual dysfunction;  Evaluation of post discharge from the hospital due to a NSTEMI. The patient was hospitalized from 12-20-2020 to 12-23-2020. The patient underwent heart catheterization and had a stent placed. The patient states that he is doing well. The patient will follow up with the PA for cardiology today. The patient is concerned as he had went to the doctor about the chest pain he was  having. Explained the risk for increased incidence of heart attack and stroke with the multiple chronic conditions the patient has. Also discussed how lifestyle choices, hereditary factors and other things may contribute to higher risk. The patient is concerned over his children. Education and support given.      A-fib:  (Status: Goal on Track (progressing): YES.) Long Term Goal added 02-08-2021- had been stable. Patient ran out of medications, daughter has gone to pick up refill order now. 04-19-2021: The patient is stable with his AFIB at this time. No new issues related to AFIB noted. Will continue to monitor.  Counseled on increased risk of stroke due to Afib and benefits of anticoagulation for stroke prevention           Reviewed importance of adherence to anticoagulant exactly as prescribed. 04-19-2021: States compliance with medications Advised patient to discuss recently running out of medications x 3 to 4 days and the patient heart being out of rhythm  with provider Counseled on bleeding risk associated with AFIB and importance of self-monitoring for signs/symptoms of bleeding Counseled on avoidance of NSAIDs due to increased bleeding risk with anticoagulants Counseled on importance of regular laboratory monitoring as prescribed Counseled on seeking medical attention after a head injury or if there is blood in the urine/stool Afib action plan reviewed. 02-08-2021: The patient was not feeling well today and states that he messed up and ran out of his medications for his AFIB. The patient states he has been out for 3 to 4 days. The patient states that his daughter has gone to pick up his refill now. The patient takes Rythmol 425 mg BID. Education given to reach out to the cardiologist for recommendations and treatment options. The patient states he hopes he feels better once starting the Rythmol back. He is taking cardiac rehab and advised the patient to discuss this with the rehab personnel as well.  The patient does not want to go back to the hospital. The patient educated on what worse looks like and to seek emergent care for changes in heart health. Education on making sure he takes his medications exactly as prescribed. Will continue to monitor for changes.    Pain:  (Status: Goal on Track (progressing): YES.) Long Term Goal  Pain assessment performed. 12-28-2020: The patient states that he is still having the right shin pain and it is very debilitating when it occurs. He plans to talk to pcp on upcoming visit. He currently rates it at a 10 when it occurs. His back pain is at a good level today. Rates at a 3/4 today on a scale of 0-10. States the stimulator seems to be on the right setting. 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 The patient denies any falls and is using his crutch for better stability. Empathetic listening and support given. He discuss his concerns with  pcp at upcoming visit on 01-10-2021 at 340 pm. 02-08-2021: The patient states that he was sitting down at the time of the call and he has no pain in his right leg or back at this time. He does have pain at a 5 or 6 in his back when he is walking and sometimes up to a 10 in his right shin. He states he did have to change the settings on his stimulator yesterday and he doesn't know if it is because of the neuropathy in his back or what. He just overall does not feel well. Education given. States that his right shine pain is at a 1 to 2 today and also his back pain. Worse pain is the neuropathy  he is having and its not really pain but aching and can be intense. He thinks the combination of things is making it worse.  He saw a dermatologist for his right shin pain and was told there was nothing that could be done. She is prescribing a medication for him to use as a topical application and they are sending this to him in mail but he is not optimistic that this will work. Education and  support given. Despite his chronic pain, he remains optimistic.  Medications reviewed. 04-19-2021:  Is compliant with his medications and has the pain stimulator as well. Reviewed provider established plan for pain management; Discussed importance of adherence to all scheduled medical appointments. Is compliant with appointments. Is considering going to the dermatologist to determine if they can help with the right shin pain Counseled on the importance of reporting any/all new or changed pain symptoms or management strategies to pain management provider; Advised patient to report to care team affect of pain on daily activities; Discussed use of relaxation techniques and/or diversional activities to assist with pain reduction (distraction, imagery, relaxation, massage, acupressure, TENS, heat, and cold application; Reviewed with patient prescribed pharmacological and nonpharmacological pain relief strategies; Advised patient to discuss ongoing right shin pain that is getting worse with provider; 04-19-2021: The patient states that he is going to go Monday to see if he is going to have to get a sleep apnea device as he thinks he has sleep apnea. Will continue to monitor.      Patient Goals/Self-Care Activities: Patient will self administer medications as prescribed as evidenced by self report/primary caregiver report  Patient will attend all scheduled provider appointments as evidenced by clinician review of documented attendance to scheduled appointments and patient/caregiver report Patient will call pharmacy for medication refills as evidenced by patient report and review of pharmacy fill history as appropriate Patient will attend church or other social activities as evidenced by patient report Patient will continue to perform ADL's independently as evidenced by patient/caregiver report Patient will continue to perform IADL's independently as evidenced by patient/caregiver report Patient will call  provider office for new concerns or questions as evidenced by review of documented incoming telephone call notes and patient report Patient will work with BSW to address care coordination needs and will continue to work with the clinical team to address health care and disease management related needs as evidenced by documented adherence to scheduled care management/care coordination appointments schedule appointment with eye doctor check blood sugar at prescribed times: when you have symptoms of low or high blood sugar, before and after exercise, and as directed by provider   check feet daily for cuts, sores or redness enter blood sugar readings and medication or insulin into daily log take the blood  sugar log to all doctor visits trim toenails straight across drink 6 to 8 glasses of water each day eat fish at least once per week fill half of plate with vegetables limit fast food meals to no more than 1 per week manage portion size prepare main meal at home 3 to 5 days each week read food labels for fat, fiber, carbohydrates and portion size reduce red meat to 2 to 3 times a week do heel pump exercise 2 to 3 times each day keep feet up while sitting wash and dry feet carefully every day wear comfortable, cotton socks wear comfortable, well-fitting shoes - check blood pressure 3 times per week - choose a place to take my blood pressure (home, clinic or office, retail store) - write blood pressure results in a log or diary - learn about high blood pressure - keep a blood pressure log - take blood pressure log to all doctor appointments - call doctor for signs and symptoms of high blood pressure - develop an action plan for high blood pressure - keep all doctor appointments - take medications for blood pressure exactly as prescribed - report new symptoms to your doctor - eat more whole grains, fruits and vegetables, lean meats and healthy fats - call for medicine refill 2 or 3 days  before it runs out - take all medications exactly as prescribed - call doctor with any symptoms you believe are related to your medicine - call doctor when you experience any new symptoms - go to all doctor appointments as scheduled - adhere to prescribed diet: Heart healthy/ADA          Our next appointment is by telephone on 06-21-2021 at 0945 am  Please call the care guide team at [phone] if you need to cancel or reschedule your appointment.   If you are experiencing a Mental Health or [place] or need someone to talk to, please call the Suicide and Crisis Lifeline: 988 call the [place] National Suicide Prevention Lifeline: 757-695-2554 or TTY: (920)679-0384 TTY (347)469-9034) to talk to a trained counselor call 1-800-273-TALK (toll free, 24 hour hotline)   Patient verbalizes understanding of instructions and care plan provided today and agrees to view in [place]. Active MyChart status confirmed with patient.    Noreene Larsson RN, MSN, [facility] Mobile: [phone]

## 2021-04-19 NOTE — Chronic Care Management (AMB) (Signed)
Chronic Care Management   CCM RN Visit Note  04/19/2021 Name: Sean Reyes. MRN: [id number] DOB: [birth year]  Subjective: Sean Reyes. is a 81 y.o. year old male who is a primary care patient of [name], [name], NP. The care management team was consulted for assistance with disease management and care coordination needs.    Engaged with patient by telephone for follow up visit in response to provider referral for case management and/or care coordination services.   Consent to Services:  The patient was given information about Chronic Care Management services, agreed to services, and gave verbal consent prior to initiation of services.  Please see initial visit note for detailed documentation.   Patient agreed to services and verbal consent obtained.   Assessment: Review of patient past medical history, allergies, medications, health status, including review of consultants reports, laboratory and other test data, was performed as part of comprehensive evaluation and provision of chronic care management services.   SDOH (Social Determinants of Health) assessments and interventions performed:    CCM Care Plan  Allergies  Allergen Reactions   Levaquin [Levofloxacin In D5w] Anaphylaxis and Shortness Of Breath   Shellfish Allergy Anaphylaxis    Has used duraprep, betadine and ioban in previous surgeries in 2019 and 2018 without issue   Amiodarone Other (See Comments)    Tremors and thyroid toxicity   Adhesive [Tape] Other (See Comments)    Little red bumps under the dressing.  He questions whether is latex related    Outpatient Encounter Medications as of 04/19/2021  Medication Sig   acetaminophen (TYLENOL) 500 MG tablet Take 500 mg by mouth every 6 (six) hours as needed (pain).   clopidogrel (PLAVIX) 75 MG tablet Take 75 mg by mouth daily.   cyanocobalamin 1000 MCG tablet Take by mouth.   ELIQUIS 5 MG TABS tablet Take 1 tablet (5 mg total) by mouth 2 (two) times daily.    finasteride (PROSCAR) 5 MG tablet Take 1 tablet (5 mg total) by mouth daily.   isosorbide mononitrate (IMDUR) 30 MG 24 hr tablet Take 30 mg by mouth daily.   metFORMIN (GLUCOPHAGE) 500 MG tablet Take 1 tablet (500 mg total) by mouth 2 (two) times daily with a meal.   metoprolol succinate (TOPROL-XL) 25 MG 24 hr tablet Take 1 tablet by mouth daily.   omeprazole (PRILOSEC) 40 MG capsule Take 40 mg by mouth daily.   propafenone (RYTHMOL SR) 425 MG 12 hr capsule Take 425 mg by mouth 2 (two) times daily. Has been out for 3 days   rosuvastatin (CRESTOR) 5 MG tablet Take 1 tablet (5 mg total) by mouth daily.   tamsulosin (FLOMAX) 0.4 MG CAPS capsule Take 0.8 mg by mouth daily.   No facility-administered encounter medications on file as of 04/19/2021.    Patient Active Problem List   Diagnosis Date Noted   Diabetes mellitus with proteinuria ([place]) 04/04/2021   Cough 04/04/2021   GERD without esophagitis 04/02/2021   Coronary artery disease 12/28/2020   History of non-ST elevation myocardial infarction (NSTEMI) 12/21/2020   Pain in right shin 10/25/2020   Peripheral vascular disease ([place]) 08/06/2020   Chronic pain syndrome 07/06/2020   Cervical facet joint syndrome 04/08/2020   Cervicalgia 04/08/2020   Spinal cord stimulator status 12/11/2019   History of 2019 novel coronavirus disease (COVID-19) 10/02/2019   CKD (chronic kidney disease) stage 3, GFR 30-59 ml/min ([place]) 01/19/2019   Acquired thrombophilia ([place]) 01/19/2019   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  Postlaminectomy syndrome, lumbar region 01/16/2019   History of fusion of lumbar spine (L2-L5) 01/16/2019   Chronic radicular lumbar pain 01/16/2019   HNP (herniated nucleus pulposus), lumbar 04/29/2018   Advanced care planning/counseling discussion 09/28/2016   Spinal stenosis, lumbar region, 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/13/2016   Hyperlipidemia associated with type 2 diabetes mellitus ([place]) 07/14/2015   Symptomatic anemia  06/30/2015   Benign prostatic hyperplasia without lower urinary tract symptoms 06/02/2015   OSA (obstructive sleep apnea) 03/23/2015   Hypertension associated with diabetes ([place]) 09/28/2014   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[place]) 09/28/2014   H/O prior ablation treatment 10/19/2011   AF (paroxysmal atrial fibrillation) ([place]) 10/19/2011    Conditions to be addressed/monitored:Atrial Fibrillation, HTN, HLD, DMII, and Chronic pain   Care Plan : RNCM: General Plan of Care (Adult) for Chronic Disease Management and Care Coordination Needs  Updates made by Vanita Ingles, RN since 04/19/2021 12:00 AM     Problem: RNCM: Development of Plan of Care for Chronic Disease Management (DM, HTN, HLD, AFIB,Chronic Pain, Recent HA)   Priority: High     Long-Range Goal: RNCM: Effective Management  of Plan of Care for Chronic Disease Management (DM, HTN, HLD, AFIB,  Chronic Pain, Recent HA)   Start Date: 12/28/2020  Expected End Date: 12/28/2021  Priority: High  Note:   Current Barriers:  Knowledge Deficits related to plan of care for management of HTN, HLD, AFIB, DMII, and Chronic pain   Chronic Disease Management support and education needs related to HTN, HLD, AFIB, DMII, and Chronic pain   RNCM Clinical Goal(s):  Patient will verbalize understanding of plan for management of HTN, HLD, AFIB, DMII, and Chronic pain as evidenced by compliance with plan of care, calling the office for changes, and working with the CCM team to effectively manage health and well being  take all medications exactly as prescribed and will call provider for medication related questions as evidenced by compliance with medications and calling for refills before running out    attend all scheduled medical appointments: 10-03-2021 at 1 pm as evidenced by keeping appointments and calling for rescheduling needs         demonstrate improved and ongoing health management independence as evidenced by working with the CCM  team to effectively manage health and well being         demonstrate a decrease in HTN, HLD, AFIB, DMII, and chronic pain  exacerbations  as evidenced by stable conditions, no acute changes, and working with the CCM team to effectively manage health and well being  demonstrate ongoing self health care management ability for effective management of chronic condtions  as evidenced by working with the CCM team  through collaboration with Consulting civil engineer, provider, and care team.   Interventions: 1:1 collaboration with primary care provider regarding development and update of comprehensive plan of care as evidenced by provider attestation and co-signature Inter-disciplinary care team collaboration (see longitudinal plan of care) Evaluation of current treatment plan related to  self management and patient's adherence to plan as established by provider   SDOH Barriers (Status: Goal on Track (progressing): YES.) Long Term Goal  Patient interviewed and SDOH assessment performed        Patient interviewed and appropriate assessments performed Provided patient with information about resources available in the community and care guides to assist with changes in SDOH, or new needs  Discussed plans with patient for ongoing care management follow up and provided patient with direct contact information for  care management team Advised patient to call the office for changes in SDOH, questions, or concerns    Diabetes:  (Status: Goal on Track (progressing): YES.) Long Term Goal   Lab Results  Component Value Date   HGBA1C 6.4 (H) 04/04/2021  Previous 6.6 Assessed patient's understanding of A1c goal: <7% Provided education to patient about basic DM disease process; Reviewed medications with patient and discussed importance of medication adherence. 04-19-2021: The patient states compliance with medications        Reviewed prescribed diet with patient Heart Healthy/ADA diet. 04-19-2021: The patient is compliant  with heart healthy/ADA diet- reviewed Counseled on importance of regular laboratory monitoring as prescribed. 12-27-  2022: The patient has regular lab work. Review of November labs.  04-19-2021: The patient has regular lab work and is compliant with the plan of care for his DM     Discussed plans with patient for ongoing care management follow up and provided patient with direct contact information for care management team;      Provided patient with written educational materials related to hypo and hyperglycemia and importance of correct treatment. 04-19-2021:  Knows how to handle high and low blood sugars. DM is stable at this time.         Reviewed scheduled/upcoming provider appointments including: 10-03-2021 at 1 PM      Advised patient, providing education and rationale, to check cbg as directed  and record        call provider for findings outside established parameters;       Review of patient status, including review of consultants reports, relevant laboratory and other test results, and medications completed;        Hyperlipidemia:  (Status: Goal on Track (progressing): YES.) Long Term Goal  Lab Results  Component Value Date   CHOL 101 04/04/2021   HDL 40 04/04/2021   LDLCALC 43 04/04/2021   TRIG 96 04/04/2021   CHOLHDL 2.6 12/21/2020     Medication review performed; medication list updated in electronic medical record.  Provider established cholesterol goals reviewed 04-19-2021: Reviewed and praised the patient for being at goal. Counseled on importance of regular laboratory monitoring as prescribed. 04-19-2021: The patient has regular lab work done.  Provided HLD educational materials; Reviewed role and benefits of statin for ASCVD risk reduction; Discussed strategies to manage statin-induced myalgias; Reviewed importance of limiting foods high in cholesterol. 04-19-2021: The patient is at goal. No new issues related to following a heart healthy/ADA diet  Reviewed exercise goals and  target of 150 minutes per week; Screening for signs and symptoms of depression related to chronic disease state;  Assessed social determinant of health barriers;   Hypertension: (Status: Goal on Track (progressing): YES.) Last practice recorded BP readings:  BP Readings from Last 3 Encounters:  04/04/21 118/74  03/09/21 110/66  02/03/21 127/71  Most recent eGFR/CrCl:  Lab Results  Component Value Date   EGFR 48 (L) 04/04/2021    No components found for: CRCL  Evaluation of current treatment plan related to hypertension self management and patient's adherence to plan as established by provider.  02-08-2021: The patient is concerned that his blood pressure is elevated first thing in the am around 170/90's in the am and after he sits down it will drop to 101/60. Denies pain or being anxious first thing in the am. Education on the goal of systolic <916 and diastolic <94. Also review of medications and if he is taking them correctly. The patient states compliance with  bp medications. The patient may need to follow up with the cardiologist as he ran out of his medications for his AFIB and he can tell his heart is out of rhythm. Education and support given. See AFIB plan of care for more details. 04-19-2021: The patient continues to do cardiac rehab and feels like he is doing well with heart healthy. HTN and blood pressure readings have stabilized. The patient denies any acute needs at this time.  Provided education to patient re: stroke prevention, s/s of heart attack and stroke. 04-19-2021:: Review of elevations in blood pressure putting the patient at higher risk of heart attack and stroke; Reviewed prescribed diet heart healthy/ADA diet. 04-19-2021 The patient states that he is compliant.  Reviewed medications with patient and discussed importance of compliance. 04-19-2021 States compliance with blood pressure medications.   Discussed plans with patient for ongoing care management follow up and provided  patient with direct contact information for care management team; Advised patient, providing education and rationale, to monitor blood pressure daily and record, calling PCP for findings outside established parameters;  Advised patient to discuss recent hospitalization  with provider; Provided education on prescribed diet heart healthy diet. 04-19-2021: Review and education provided;  Discussed complications of poorly controlled blood pressure such as heart disease, stroke, circulatory complications, vision complications, kidney impairment, sexual dysfunction;  Evaluation of post discharge from the hospital due to a NSTEMI. The patient was hospitalized from 12-20-2020 to 12-23-2020. The patient underwent heart catheterization and had a stent placed. The patient states that he is doing well. The patient will follow up with the PA for cardiology today. The patient is concerned as he had went to the doctor about the chest pain he was having. Explained the risk for increased incidence of heart attack and stroke with the multiple chronic conditions the patient has. Also discussed how lifestyle choices, hereditary factors and other things may contribute to higher risk. The patient is concerned over his children. Education and support given.    A-fib:  (Status: Goal on Track (progressing): YES.) Long Term Goal added 02-08-2021- had been stable. Patient ran out of medications, daughter has gone to pick up refill order now. 04-19-2021: The patient is stable with his AFIB at this time. No new issues related to AFIB noted. Will continue to monitor.  Counseled on increased risk of stroke due to Afib and benefits of anticoagulation for stroke prevention           Reviewed importance of adherence to anticoagulant exactly as prescribed. 04-19-2021: States compliance with medications Advised patient to discuss recently running out of medications x 3 to 4 days and the patient heart being out of rhythm  with provider Counseled  on bleeding risk associated with AFIB and importance of self-monitoring for signs/symptoms of bleeding Counseled on avoidance of NSAIDs due to increased bleeding risk with anticoagulants Counseled on importance of regular laboratory monitoring as prescribed Counseled on seeking medical attention after a head injury or if there is blood in the urine/stool Afib action plan reviewed. 02-08-2021: The patient was not feeling well today and states that he messed up and ran out of his medications for his AFIB. The patient states he has been out for 3 to 4 days. The patient states that his daughter has gone to pick up his refill now. The patient takes Rythmol 425 mg BID. Education given to reach out to the cardiologist for recommendations and treatment options. The patient states he hopes he feels better once starting the  Rythmol back. He is taking cardiac rehab and advised the patient to discuss this with the rehab personnel as well. The patient does not want to go back to the hospital. The patient educated on what worse looks like and to seek emergent care for changes in heart health. Education on making sure he takes his medications exactly as prescribed. Will continue to monitor for changes.   Pain:  (Status: Goal on Track (progressing): YES.) Long Term Goal  Pain assessment performed. 12-28-2020: The patient states that he is still having the right shin pain and it is very debilitating when it occurs. He plans to talk to pcp on upcoming visit. He currently rates it at a 10 when it occurs. His back pain is at a good level today. Rates at a 3/4 today on a scale of 0-10. States the stimulator seems to be on the right setting. 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 The patient denies any falls and is using his crutch for better stability. Empathetic listening and support given. He discuss his concerns with pcp at upcoming visit on 01-10-2021 at 340 pm. 02-08-2021:  The patient states that he was sitting down at the time of the call and he has no pain in his right leg or back at this time. He does have pain at a 5 or 6 in his back when he is walking and sometimes up to a 10 in his right shin. He states he did have to change the settings on his stimulator yesterday and he doesn't know if it is because of the neuropathy in his back or what. He just overall does not feel well. Education given. States that his right shine pain is at a 1 to 2 today and also his back pain. Worse pain is the neuropathy  he is having and its not really pain but aching and can be intense. He thinks the combination of things is making it worse.  He saw a dermatologist for his right shin pain and was told there was nothing that could be done. She is prescribing a medication for him to use as a topical application and they are sending this to him in mail but he is not optimistic that this will work. Education and support given. Despite his chronic pain, he remains optimistic.  Medications reviewed. 04-19-2021:  Is compliant with his medications and has the pain stimulator as well. Reviewed provider established plan for pain management; Discussed importance of adherence to all scheduled medical appointments. Is compliant with appointments. Is considering going to the dermatologist to determine if they can help with the right shin pain Counseled on the importance of reporting any/all new or changed pain symptoms or management strategies to pain management provider; Advised patient to report to care team affect of pain on daily activities; Discussed use of relaxation techniques and/or diversional activities to assist with pain reduction (distraction, imagery, relaxation, massage, acupressure, TENS, heat, and cold application; Reviewed with patient prescribed pharmacological and nonpharmacological pain relief strategies; Advised patient to discuss ongoing right shin pain that is getting worse with  provider; 04-19-2021: The patient states that he is going to go Monday to see if he is going to have to get a sleep apnea device as he thinks he has sleep apnea. Will continue to monitor.    Patient Goals/Self-Care Activities: Patient will self administer medications as prescribed as evidenced by self report/primary caregiver report  Patient will attend all scheduled  provider appointments as evidenced by clinician review of documented attendance to scheduled appointments and patient/caregiver report Patient will call pharmacy for medication refills as evidenced by patient report and review of pharmacy fill history as appropriate Patient will attend church or other social activities as evidenced by patient report Patient will continue to perform ADL's independently as evidenced by patient/caregiver report Patient will continue to perform IADL's independently as evidenced by patient/caregiver report Patient will call provider office for new concerns or questions as evidenced by review of documented incoming telephone call notes and patient report Patient will work with BSW to address care coordination needs and will continue to work with the clinical team to address health care and disease management related needs as evidenced by documented adherence to scheduled care management/care coordination appointments schedule appointment with eye doctor check blood sugar at prescribed times: when you have symptoms of low or high blood sugar, before and after exercise, and as directed by provider   check feet daily for cuts, sores or redness enter blood sugar readings and medication or insulin into daily log take the blood sugar log to all doctor visits trim toenails straight across drink 6 to 8 glasses of water each day eat fish at least once per week fill half of plate with vegetables limit fast food meals to no more than 1 per week manage portion size prepare main meal at home 3 to 5 days each  week read food labels for fat, fiber, carbohydrates and portion size reduce red meat to 2 to 3 times a week do heel pump exercise 2 to 3 times each day keep feet up while sitting wash and dry feet carefully every day wear comfortable, cotton socks wear comfortable, well-fitting shoes - check blood pressure 3 times per week - choose a place to take my blood pressure (home, clinic or office, retail store) - write blood pressure results in a log or diary - learn about high blood pressure - keep a blood pressure log - take blood pressure log to all doctor appointments - call doctor for signs and symptoms of high blood pressure - develop an action plan for high blood pressure - keep all doctor appointments - take medications for blood pressure exactly as prescribed - report new symptoms to your doctor - eat more whole grains, fruits and vegetables, lean meats and healthy fats - call for medicine refill 2 or 3 days before it runs out - take all medications exactly as prescribed - call doctor with any symptoms you believe are related to your medicine - call doctor when you experience any new symptoms - go to all doctor appointments as scheduled - adhere to prescribed diet: Heart healthy/ADA       Plan:Telephone follow up appointment with care management team member scheduled for:  06-21-2021 at [place] am  Noreene Larsson RN, MSN, [facility] Mobile: [phone]

## 2021-04-20 ENCOUNTER — Ambulatory Visit: Payer: Self-pay | Admitting: *Deleted

## 2021-04-20 ENCOUNTER — Telehealth: Payer: Self-pay | Admitting: Nurse Practitioner

## 2021-04-20 NOTE — Telephone Encounter (Signed)
Called and spoke with the patient. Advised him of Jolene's recommendations. Patient states that he is feeling a little bit better and that he is not going to the ER. Patient states that he is going to call down to [facility] in the morning and follow up with them. Advised the patient that if he starts feeling worse and symptoms do not continue to improve, to please be seen and checked out at the ER. Patient verbalized understanding.  ?

## 2021-04-20 NOTE — Telephone Encounter (Signed)
See other phone encounter.  

## 2021-04-20 NOTE — Telephone Encounter (Signed)
? ? ?  Chief Complaint: Weakness, dizziness ?Symptoms: very weak, lightheaded, has to I/O cath, no voids on own. BP elevated,Legs "Tingly"  ?Frequency: yesterday worsening today ?Pertinent Negatives: Patient denies  ?Disposition: '[x]'$ ED /'[]'$ Urgent Care (no appt availability in office) / '[]'$ Appointment(In office/virtual)/ '[]'$  [facility]/ '[]'$ Home Care/ '[]'$ Refused Recommended Disposition /'[]'$ [place] [facility]/ '[]'$  Follow-up with PCP ?Additional Notes: Daughter states pt is "Very weak and pale. Not like him."  States will follow disposition of ED eval. ? ? Reason for Disposition ? Patient sounds very sick or weak to the triager ? ?Answer Assessment - Initial Assessment Questions ?1. DESCRIPTION: "Describe how you are feeling." ?    Family states very weak, pale ?2. SEVERITY: "How bad is it?"  "Can you stand and walk?" ?  - MILD - Feels weak or tired, but does not interfere with work, school or normal activities ?  - MODERATE - Able to stand and walk; weakness interferes with work, school, or normal activities ?  - SEVERE - Unable to stand or walk ?    moderate ?3. ONSET:  "When did the weakness begin?" ?    Days ago, worsening ?4. CAUSE: "What do you think is causing the weakness?" ?     ?5. MEDICINES: "Have you recently started a new medicine or had a change in the amount of a medicine?" ?    No. Seems confused as to what meds he has taken and which ones he has forgotten to take. ?6. OTHER SYMPTOMS: "Do you have any other symptoms?" (e.g., chest pain, fever, cough, SOB, vomiting, diarrhea, bleeding, other areas of pain) ?Lightheadedness, can void only with I/O cathing. BP 164/100 BS 152. FAmily states has not been eating, drinking very little, Pt states legs "Feel weird, tingly" ? ?Protocols used: Weakness (Generalized) and Fatigue-A-AH ? ?

## 2021-04-20 NOTE — Telephone Encounter (Signed)
Copied from [place] [id number]. Topic: General - Other ?>> Apr 20, 2021  3:06 PM Valere Dross wrote: ?Reason for CRM: Pt called in wanting to speak with PCP nurse about being advised by Nurse Triage today to go to the hospital, pt states he did not go and requested a call back, please advise. ?

## 2021-04-23 NOTE — Patient Instructions (Signed)
Fatigue ?If you have fatigue, you feel tired all the time and have a lack of energy or a lack of motivation. Fatigue may make it difficult to start or complete tasks because of exhaustion. In general, occasional or mild fatigue is often a normal response to activity or life. However, long-lasting (chronic) or extreme fatigue may be a symptom of a medical condition. ?Follow these instructions at home: ?General instructions ?Watch your fatigue for any changes. ?Go to bed and get up at the same time every day. ?Avoid fatigue by pacing yourself during the day and getting enough sleep at night. ?Maintain a healthy weight. ?Medicines ?Take over-the-counter and prescription medicines only as told by your health care provider. ?Take a multivitamin, if told by your health care provider.  ?Do not use herbal or dietary supplements unless they are approved by your health care provider. ?Activity ? ?Exercise regularly, as told by your health care provider. ?Use or practice techniques to help you relax, such as yoga, tai chi, meditation, or massage therapy. ?Eating and drinking ? ?Avoid heavy meals in the evening. ?Eat a well-balanced diet, which includes lean proteins, whole grains, plenty of fruits and vegetables, and low-fat dairy products. ?Avoid consuming too much caffeine. ?Avoid the use of alcohol. ?Drink enough fluid to keep your urine pale yellow. ?Lifestyle ?Change situations that cause you stress. Try to keep your work and personal schedule in balance. ?Do not use any products that contain nicotine or tobacco, such as cigarettes and e-cigarettes. If you need help quitting, ask your health care provider. ?Do not use drugs. ?Contact a health care provider if: ?Your fatigue does not get better. ?You have a fever. ?You suddenly lose or gain weight. ?You have headaches. ?You have trouble falling asleep or sleeping through the night. ?You feel angry, guilty, anxious, or sad. ?You are unable to have a bowel movement  (constipation). ?Your skin is dry. ?You have swelling in your legs or another part of your body. ?Get help right away if: ?You feel confused. ?Your vision is blurry. ?You feel faint or you pass out. ?You have a severe headache. ?You have severe pain in your abdomen, your back, or the area between your waist and hips (pelvis). ?You have chest pain, shortness of breath, or an irregular or fast heartbeat. ?You are unable to urinate, or you urinate less than normal. ?You have abnormal bleeding, such as bleeding from the rectum, vagina, nose, lungs, or nipples. ?You vomit blood. ?You have thoughts about hurting yourself or others. ?If you ever feel like you may hurt yourself or others, or have thoughts about taking your own life, get help right away. You can go to your nearest emergency department or call: ?Your local emergency services (911 in the [place] ?A suicide crisis helpline, such as the National Suicide Prevention Lifeline at 1-800-273-8255 or 988 in the [place] This is open 24 hours a day. ?Summary ?If you have fatigue, you feel tired all the time and have a lack of energy or a lack of motivation. ?Fatigue may make it difficult to start or complete tasks because of exhaustion. ?Long-lasting (chronic) or extreme fatigue may be a symptom of a medical condition. ?Exercise regularly, as told by your health care provider. ?Change situations that cause you stress. Try to keep your work and personal schedule in balance. ?This information is not intended to replace advice given to you by your health care provider. Make sure you discuss any questions you have with your health care provider. ?Document Revised:   08/25/2020 Document Reviewed: 12/11/2019 ?Elsevier Patient Education ? 2022 [employer] ? ?

## 2021-04-25 ENCOUNTER — Other Ambulatory Visit: Payer: Self-pay

## 2021-04-25 ENCOUNTER — Ambulatory Visit (INDEPENDENT_AMBULATORY_CARE_PROVIDER_SITE_OTHER): Payer: Medicare Other | Admitting: Internal Medicine

## 2021-04-25 ENCOUNTER — Ambulatory Visit (INDEPENDENT_AMBULATORY_CARE_PROVIDER_SITE_OTHER): Payer: Medicare Other | Admitting: Nurse Practitioner

## 2021-04-25 ENCOUNTER — Encounter: Payer: Self-pay | Admitting: Nurse Practitioner

## 2021-04-25 VITALS — BP 131/82 | HR 61 | Resp 14 | Ht 73.0 in | Wt 190.0 lb

## 2021-04-25 VITALS — BP 132/68 | HR 62 | Temp 97.8°F | Ht 73.0 in | Wt 191.0 lb

## 2021-04-25 DIAGNOSIS — D649 Anemia, unspecified: Secondary | ICD-10-CM | POA: Diagnosis not present

## 2021-04-25 DIAGNOSIS — E559 Vitamin D deficiency, unspecified: Secondary | ICD-10-CM | POA: Diagnosis not present

## 2021-04-25 DIAGNOSIS — E1143 Type 2 diabetes mellitus with diabetic autonomic (poly)neuropathy: Secondary | ICD-10-CM

## 2021-04-25 DIAGNOSIS — E1159 Type 2 diabetes mellitus with other circulatory complications: Secondary | ICD-10-CM | POA: Diagnosis not present

## 2021-04-25 DIAGNOSIS — G894 Chronic pain syndrome: Secondary | ICD-10-CM | POA: Diagnosis not present

## 2021-04-25 DIAGNOSIS — I25110 Atherosclerotic heart disease of native coronary artery with unstable angina pectoris: Secondary | ICD-10-CM

## 2021-04-25 DIAGNOSIS — E538 Deficiency of other specified B group vitamins: Secondary | ICD-10-CM

## 2021-04-25 DIAGNOSIS — R5383 Other fatigue: Secondary | ICD-10-CM | POA: Diagnosis not present

## 2021-04-25 DIAGNOSIS — I480 Paroxysmal atrial fibrillation: Secondary | ICD-10-CM

## 2021-04-25 DIAGNOSIS — I152 Hypertension secondary to endocrine disorders: Secondary | ICD-10-CM | POA: Diagnosis not present

## 2021-04-25 DIAGNOSIS — G4733 Obstructive sleep apnea (adult) (pediatric): Secondary | ICD-10-CM

## 2021-04-25 DIAGNOSIS — M79661 Pain in right lower leg: Secondary | ICD-10-CM

## 2021-04-25 LAB — URINALYSIS, ROUTINE W REFLEX MICROSCOPIC
Bilirubin, UA: NEGATIVE
Glucose, UA: NEGATIVE
Leukocytes,UA: NEGATIVE
Nitrite, UA: NEGATIVE
RBC, UA: NEGATIVE
Specific Gravity, UA: >1.03 — ABNORMAL HIGH (ref 1.005–1.030)
Urobilinogen, Ur: 0.2 mg/dL (ref 0.2–1.0)
pH, UA: 5.5 (ref 5.0–7.5)

## 2021-04-25 LAB — MICROSCOPIC EXAMINATION
Bacteria, UA: NONE SEEN
Epithelial Cells (non renal): NONE SEEN /hpf (ref 0–10)
RBC, Urine: NONE SEEN /hpf (ref 0–2)
WBC, UA: NONE SEEN /hpf (ref 0–5)

## 2021-04-25 NOTE — Progress Notes (Signed)
BP 132/68    Pulse 62    Temp 97.8 F (36.6 C) (Oral)    Ht '6\' 1"'$  (1.854 m)    Wt 191 lb (86.6 kg)    SpO2 98%    BMI 25.20 kg/m    Subjective:    Patient ID: Sean Reyes., male    DOB: 01/08/1941, 81 y.o.   MRN: [id number]  HPI: Denis Carreon. is a 81 y.o. male  Chief Complaint  Patient presents with   Fatigue    Patient says he feels bad just about every day says the pain he feels is from his back and he is more confused than usually. 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 Patient states he wanted to discuss with provider to get some advice on his next steps.    FATIGUE Ongoing fatigue is present.  History of NSTEMI on 12/20/20, with left heart cath done on 12/22/20. Has recent episode of weakness and dizziness on 04/20/21, which he reports is now improved.  He is not feeling himself, feels like a zombie with brain fog.  Has adjustments to pain stimulator on 03/21/21. Has ongoing neuropathy issues -- difficulty with balance due to this.  Saw dermatology recently and they started him on a cream with Gabapentin in it for the area on his leg which causes discomfort.  Followed by hematology for anemia, with last visit on 03/09/21.  He has completed IV iron.    Went to see pulmonary this morning about CPAP, did use CPAP at one time.  Has not used CPAP in 2-3 years.  He is going to do home sleep study, as previous was inconclusive years ago in sleep center.  Does have issues with getting up to bathroom 2-3 times to void -- takes Flomax and Vesicare nightly, goes to see  urology beginning of April.  Has had ongoing issues with bladder control since his pain stimulator placed.  In past has had to have Botox injections to bladder, which offered benefit.   Duration:  chronic Severity: 9/10  Onset: gradual Context when symptoms started:   NSTEMI Symptoms improve with rest: yes -- but 1 hour after waking gets brain fog Depressive symptoms: no Stress/anxiety:  no Insomnia: yes hard to stay asleep Snoring: yes Observed apnea by bed partner: no Daytime hypersomnolence:yes Wakes feeling refreshed: no History of sleep study: yes Dysnea on exertion:  no Orthopnea/PND: no Chest pain: no Chronic cough: no Lower extremity edema: no Arthralgias: yes Myalgias: no Weakness: yes Rash: no   Relevant past medical, surgical, family and social history reviewed and updated as indicated. Interim medical history since our last visit reviewed. Allergies and medications reviewed and updated.  Review of Systems  Constitutional:  Positive for fatigue. Negative for activity change, appetite change, diaphoresis and fever.  Respiratory:  Negative for cough, chest tightness, shortness of breath and wheezing.   Cardiovascular:  Negative for chest pain, palpitations and leg swelling.  Gastrointestinal: Negative.   Endocrine: Negative for cold intolerance, heat intolerance, polydipsia, polyphagia and polyuria.  Musculoskeletal:  Positive for arthralgias.  Neurological: Negative.   Psychiatric/Behavioral: Negative.     Per HPI unless specifically indicated above     Objective:    BP 132/68    Pulse 62    Temp 97.8 F (36.6 C) (Oral)    Ht '6\' 1"'$  (1.854 m)    Wt 191 lb (86.6 kg)    SpO2 98%    BMI  25.20 kg/m   Wt Readings from Last 3 Encounters:  04/25/21 191 lb (86.6 kg)  04/25/21 190 lb (86.2 kg)  04/04/21 193 lb 12.8 oz (87.9 kg)    Physical Exam Vitals and nursing note reviewed.  Constitutional:      General: He is awake. He is not in acute distress.    Appearance: He is well-developed and well-groomed. He is not ill-appearing.  HENT:     Head: Normocephalic and atraumatic.     Right Ear: Hearing normal. No drainage.     Left Ear: Hearing normal. No drainage.  Eyes:     General: Lids are normal.        Right eye: No discharge.        Left eye: No discharge.     Conjunctiva/sclera: Conjunctivae normal.     Pupils: Pupils are equal, round, and  reactive to light.  Neck:     Thyroid: No thyromegaly.     Vascular: No carotid bruit.     Trachea: Trachea normal.  Cardiovascular:     Rate and Rhythm: Normal rate and regular rhythm.     Heart sounds: Normal heart sounds, S1 normal and S2 normal. No murmur heard.   No gallop.  Pulmonary:     Effort: Pulmonary effort is normal. No accessory muscle usage or respiratory distress.     Breath sounds: Normal breath sounds.  Abdominal:     General: Bowel sounds are normal.     Palpations: Abdomen is soft.  Musculoskeletal:        General: Normal range of motion.     Cervical back: Normal range of motion and neck supple.     Right lower leg: No edema.     Left lower leg: No edema.  Lymphadenopathy:     Cervical: No cervical adenopathy.  Skin:    General: Skin is warm and dry.     Capillary Refill: Capillary refill takes less than 2 seconds.  Neurological:     Mental Status: He is alert and oriented to person, place, and time.     Deep Tendon Reflexes:     Reflex Scores:      Patellar reflexes are 1+ on the right side and 1+ on the left side.      Achilles reflexes are 1+ on the right side and 1+ on the left side.    Comments: Antalgic gait, using cane.  Psychiatric:        Attention and Perception: Attention normal.        Mood and Affect: Mood normal.        Speech: Speech normal.        Behavior: Behavior normal. Behavior is cooperative.        Thought Content: Thought content normal.    Results for orders placed or performed in visit on 04/25/21  Microscopic Examination   Urine  Result Value Ref Range   WBC, UA None seen 0 - 5 /hpf   RBC None seen 0 - 2 /hpf   Epithelial Cells (non renal) None seen 0 - 10 /hpf   Mucus, UA Present (A) Not Estab.   Bacteria, UA None seen None seen/Few  Urinalysis, Routine w reflex microscopic  Result Value Ref Range   Specific Gravity, UA >1.030 (H) 1.005 - 1.030   pH, UA 5.5 5.0 - 7.5   Color, UA Yellow Yellow   Appearance Ur Clear  Clear   Leukocytes,UA Negative Negative   Protein,UA 1+ (A) Negative/Trace  Glucose, UA Negative Negative   Ketones, UA Trace (A) Negative   RBC, UA Negative Negative   Bilirubin, UA Negative Negative   Urobilinogen, Ur 0.2 0.2 - 1.0 mg/dL   Nitrite, UA Negative Negative   Microscopic Examination See below:       Assessment & Plan:   Problem List Items Addressed This Visit       Endocrine   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  Decreased sensation bilateral feet, monitor closely for wounds and falls.  Continue current medication regimen and adjust as needed.  Would benefit from return to Benazepril in future or alternate ACE or ARB for kidney protection with proteinuria.  Continue to monitor BS daily and document for visits.  Referral to neurology and labs today: CBC, B12, iron, ferritin, thyroid check.      Relevant Orders   Ambulatory referral to Neurology     Other   Chronic pain syndrome    Chronic, ongoing.  Followed by neurosurgery and pain management as needed.  Continue this collaboration and current regimen.      Fatigue    Ongoing issue with worsening post STEMI.  Labs today CBC, CMP, thyroid, Mag, iron, ferritin, and B12.  Suspect multifactorial with main issue being need for new CPAP, he is going to schedule home sleep study. Discussed at length with him today.      Relevant Orders   CBC with Differential/Platelet   Comprehensive metabolic panel   Vitamin W97   TSH   T4, free   Magnesium   VITAMIN D 25 Hydroxy (Vit-D Deficiency, Fractures)   Iron Binding Cap (TIBC)([facility])   Urinalysis, Routine w reflex microscopic (Completed)   Pain in right shin    Improved with current cream ordered by dermatology, continue this collaboration.      Symptomatic anemia    Chronic, stable.  Continue collaboration with hematology, recent notes and labs reviewed.       Relevant Orders   Vitamin B12   Iron Binding Cap (TIBC)([facility])   Ferritin   Other Visit Diagnoses     Vitamin D deficiency       History of low levels reported, check today and start supplement as needed.   Relevant Orders   VITAMIN D 25 Hydroxy (Vit-D Deficiency, Fractures)   B12 deficiency       History of low levels reported, check today and start supplement as needed.   Relevant Orders   Vitamin B12        Follow up plan: Return in about 3 weeks (around 05/16/2021) for Fatigue and Neuropathy.

## 2021-04-25 NOTE — Assessment & Plan Note (Signed)
Chronic, ongoing with A1c 6.5% recent visit and urine ALB 80.  Decreased sensation bilateral feet, monitor closely for wounds and falls.  Continue current medication regimen and adjust as needed.  Would benefit from return to Benazepril in future or alternate ACE or ARB for kidney protection with proteinuria.  Continue to monitor BS daily and document for visits.  Referral to neurology and labs today: CBC, B12, iron, ferritin, thyroid check. ?

## 2021-04-25 NOTE — Assessment & Plan Note (Signed)
Chronic, stable.  Continue collaboration with hematology, recent notes and labs reviewed. ?

## 2021-04-25 NOTE — Assessment & Plan Note (Signed)
Improved with current cream ordered by dermatology, continue this collaboration. ?

## 2021-04-25 NOTE — Assessment & Plan Note (Signed)
Chronic, ongoing.  Followed by neurosurgery and pain management as needed.  Continue this collaboration and current regimen. 

## 2021-04-25 NOTE — Assessment & Plan Note (Signed)
Ongoing issue with worsening post STEMI.  Labs today CBC, CMP, thyroid, Mag, iron, ferritin, and B12.  Suspect multifactorial with main issue being need for new CPAP, he is going to schedule home sleep study. Discussed at length with him today. ?

## 2021-04-25 NOTE — Progress Notes (Unsigned)
[facility] [place], [place] [address]  Pulmonary Sleep Medicine   Office Visit Note  Patient Name: Sean Reyes. DOB: 1940-12-26 MRN [id number]    Chief Complaint: Obstructive Sleep Apnea visit  Brief History:  Sean Reyes is seen today for initial consult to establish care and review sleep study results.   The patient has a 13 year history of sleep apnea, but original diagnostic PSG  is not on file. Patient is not using PAP nightly.  The patient feels no change after sleeping with PAP.  The patient reports no difference and he never felt any better with PAP device  from PAP use.  Epworth Sleepiness Score is 7 out of 24. The patient does take nap 2 - 3 times weekly for 1 hour.. The patient complains of the following: no usage in over 2 years because he doesn't feel he needs it  The compliance download shows  compliance with an average use time of no data hours. The AHI is unavailable (no data, due to no use of cpap)   The patient does not complain of limb movements disrupting sleep.  ROS  General: (-) fever, (-) chills, (-) night sweat Nose and Sinuses: (-) nasal stuffiness or itchiness, (-) postnasal drip, (-) nosebleeds, (-) sinus trouble. Mouth and Throat: (-) sore throat, (-) hoarseness. Neck: (-) swollen glands, (-) enlarged thyroid, (-) neck pain. Respiratory: - cough, - shortness of breath, - wheezing. Neurologic: +numbness, + tingling. Psychiatric: - anxiety, - depression   Current Medication: Outpatient Encounter Medications as of 04/25/2021  Medication Sig   acetaminophen (TYLENOL) 500 MG tablet Take 500 mg by mouth every 6 (six) hours as needed (pain).   clopidogrel (PLAVIX) 75 MG tablet Take 75 mg by mouth daily.   cyanocobalamin 1000 MCG tablet Take by mouth.   ELIQUIS 5 MG TABS tablet Take 1 tablet (5 mg total) by mouth 2 (two) times daily.   finasteride (PROSCAR) 5 MG tablet Take 1 tablet (5 mg total) by mouth daily.   isosorbide  mononitrate (IMDUR) 30 MG 24 hr tablet Take 30 mg by mouth daily.   metFORMIN (GLUCOPHAGE) 500 MG tablet Take 1 tablet (500 mg total) by mouth 2 (two) times daily with a meal.   metoprolol succinate (TOPROL-XL) 25 MG 24 hr tablet Take 1 tablet by mouth daily.   omeprazole (PRILOSEC) 40 MG capsule Take 40 mg by mouth daily.   propafenone (RYTHMOL SR) 425 MG 12 hr capsule Take 425 mg by mouth 2 (two) times daily. Has been out for 3 days   rosuvastatin (CRESTOR) 5 MG tablet Take 1 tablet (5 mg total) by mouth daily.   solifenacin (VESICARE) 5 MG tablet Take 5 mg by mouth daily.   tamsulosin (FLOMAX) 0.4 MG CAPS capsule Take 0.8 mg by mouth daily.   No facility-administered encounter medications on file as of 04/25/2021.    Surgical History: Past Surgical History:  Procedure Laterality Date   ABLATION     ANTERIOR LAT LUMBAR FUSION N/A 06/27/2017   Procedure: Anterior Lateral Lumbar Interbody  Fusion - Lumbar Two-Lumbar Three - Lumbar Three-Lumbar Four, Posterior Lumbar Interbody Fusion Lumbar Four- Five;  Surgeon: Kary Kos, MD;  Location: [place];  Service: Neurosurgery;  Laterality: N/A;  Anterior Lateral Lumbar Interbody  Fusion - Lumbar Two-Lumbar Three - Lumbar Three-Lumbar Four, Posterior Lumbar Interbody Fusion Lumbar Four- Five   BACK SURGERY     CARDIOVERSION N/A 08/29/2018   Procedure: CARDIOVERSION;  Surgeon: Corey Skains, MD;  Location: [facility];  Service: Cardiovascular;  Laterality: N/A;   CARDIOVERSION N/A 09/24/2018   Procedure: CARDIOVERSION;  Surgeon: Corey Skains, MD;  Location: [facility];  Service: Cardiovascular;  Laterality: N/A;   COLONOSCOPY WITH PROPOFOL N/A 10/05/2015   Procedure: COLONOSCOPY WITH PROPOFOL;  Surgeon: Lollie Sails, MD;  Location: [facility] ENDOSCOPY;  Service: Endoscopy;  Laterality: N/A;   COLONOSCOPY WITH PROPOFOL N/A 11/01/2020   Procedure: COLONOSCOPY WITH PROPOFOL;  Surgeon: Jonathon Bellows, MD;  Location: [facility] ENDOSCOPY;  Service:  Gastroenterology;  Laterality: N/A;   CORONARY STENT INTERVENTION N/A 12/22/2020   Procedure: CORONARY STENT INTERVENTION;  Surgeon: [name], MD;  Location: [place] [facility];  Service: Cardiovascular;  Laterality: N/A;   ESOPHAGOGASTRODUODENOSCOPY N/A 11/01/2020   Procedure: ESOPHAGOGASTRODUODENOSCOPY (EGD);  Surgeon: Jonathon Bellows, MD;  Location: [facility] ENDOSCOPY;  Service: Gastroenterology;  Laterality: N/A;   ESOPHAGOGASTRODUODENOSCOPY (EGD) WITH PROPOFOL N/A 04/01/2018   Procedure: ESOPHAGOGASTRODUODENOSCOPY (EGD) WITH PROPOFOL;  Surgeon: Lollie Sails, MD;  Location: [facility] ENDOSCOPY;  Service: Endoscopy;  Laterality: N/A;   HERNIA REPAIR     JOINT REPLACEMENT Bilateral    hips  RT+  LEFT X2    LEFT HEART CATH AND CORONARY ANGIOGRAPHY N/A 12/22/2020   Procedure: LEFT HEART CATH AND CORONARY ANGIOGRAPHY;  Surgeon: [name], MD;  Location: [place] [facility];  Service: Cardiovascular;  Laterality: N/A;   LUMBAR LAMINECTOMY/DECOMPRESSION MICRODISCECTOMY Left 09/13/2016   Procedure: Microdiscectomy - Lumbar two-three,  Lumbar three- - left;  Surgeon: Kary Kos, MD;  Location: [place];  Service: Neurosurgery;  Laterality: Left;   SPINAL CORD STIMULATOR INSERTION  07/08/2019   TONSILLECTOMY      Medical History: Past Medical History:  Diagnosis Date   Anemia    Anxiety    Arthritis    Arthritis of neck    Atrial fibrillation (HCC)    Cataracts, bilateral    Complication of anesthesia    pt reports low BP's after surgery at [facility] and difficulty awakening   Depression    Diabetes ([place])    dx 6-8 yrs ago   Dysrhythmia    a-fib   GERD (gastroesophageal reflux disease)    OCC TAKES ALKA SELTZER   History of kidney stones    10-15 yrs ago   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   Hyperlipidemia    Hypertension    Myocardial infarction ([place]) 12/2020   Nocturia    S/P ablation of atrial fibrillation    Ablative therapy   Sleep apnea     CPAP    Spinal stenosis    Tachycardia, unspecified     Family History: Non contributory to the present illness  Social History: Social History   Socioeconomic History   Marital status: Married    Spouse name: Malachy Mood    Number of children: 2   Years of education: Not on file   Highest education level: High school graduate  Occupational History   Occupation: retired   Tobacco Use   Smoking status: Never   Smokeless tobacco: Never  Vaping Use   Vaping Use: Never used  Substance and Sexual Activity   Alcohol use: No   Drug use: No   Sexual activity: Not on file  Other Topics Concern   Not on file  Social History Narrative   MarriedGets regular exercise.      Lives in [place] wife/ daughter. Never smoked; no alcohol. 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  Social Determinants of Health   Financial Resource Strain: Low Risk    Difficulty of Paying Living Expenses: Not hard at all  Food Insecurity: No Food Insecurity   Worried About Charity fundraiser in the Last Year: Never true   [place] in the Last Year: Never true  Transportation Needs: No Transportation Needs   Lack of Transportation (Medical): No   Lack of Transportation (Non-Medical): No  Physical Activity: Inactive   Days of Exercise per Week: 0 days   Minutes of Exercise per Session: 0 min  Stress: No Stress Concern Present   Feeling of Stress : Not at all  Social Connections: Moderately Integrated   Frequency of Communication with Friends and Family: More than three times a week   Frequency of Social Gatherings with Friends and Family: More than three times a week   Attends Religious Services: More than 4 times per year   Active Member of [employer] or Organizations: No   Attends Archivist Meetings: Never   Marital Status: Married  Human resources officer Violence: Not At Risk   Fear of Current or Ex-Partner: No   Emotionally Abused: No    Physically Abused: No   Sexually Abused: No    Vital Signs: Blood pressure 131/82, pulse 61, resp. rate 14, height _0  (1.854 m), weight 190 lb (86.2 kg), SpO2 99 %. Body mass index is 25.07 kg/m.    Examination: General Appearance: The patient is well-developed, well-nourished, and in no distress. Neck Circumference: 46cm Skin: Gross inspection of skin unremarkable. Head: normocephalic, no gross deformities. Eyes: no gross deformities noted. ENT: ears appear grossly normal Neurologic: Alert and oriented. No involuntary movements.    EPWORTH SLEEPINESS SCALE:  Scale:  (0)= no chance of dozing; (1)= slight chance of dozing; (2)= moderate chance of dozing; (3)= high chance of dozing  Chance  Situtation    Sitting and reading: 2    Watching TV: 1    Sitting Inactive in public: 0    As a passenger in car: 1      Lying down to rest: 2    Sitting and talking: 0    Sitting quielty after lunch: 1    In a car, stopped in traffic: 0   TOTAL SCORE:   7 out of 24    SLEEP STUDIES:  PSG - 01/11/21 AHI 0, min SpO2 92%; despite snoring, this routine PSG did not demonstrate OSA in lateral position; no respiratory events: recommend repeat in supine position Titration - 04/28/08 APAP 5-20 cm H2O    CPAP COMPLIANCE DATA: no usage in over 2 years  Date Range: -  Average Daily Use: - hours  Median Use: -  Compliance for > 4 Hours: - days  AHI: - respiratory events per hour  Days Used: -  Mask Leak: -  95th Percentile Pressure: -         LABS: Recent Results (from the past 2160 hour(s))  Glucose, capillary     Status: Abnormal   Collection Time: 01/27/21  2:00 PM  Result Value Ref Range   Glucose-Capillary 142 (H) 70 - 99 mg/dL    Comment: Glucose reference range applies only to samples taken after fasting for at least 8 hours.  Glucose, capillary     Status: Abnormal   Collection Time: 01/27/21  3:01 PM  Result Value Ref Range   Glucose-Capillary  116 (H) 70 - 99 mg/dL    Comment: Glucose reference range applies only  to samples taken after fasting for at least 8 hours.  Glucose, capillary     Status: Abnormal   Collection Time: 01/31/21  2:08 PM  Result Value Ref Range   Glucose-Capillary 195 (H) 70 - 99 mg/dL    Comment: Glucose reference range applies only to samples taken after fasting for at least 8 hours.  Glucose, capillary     Status: Abnormal   Collection Time: 01/31/21  3:04 PM  Result Value Ref Range   Glucose-Capillary 138 (H) 70 - 99 mg/dL    Comment: Glucose reference range applies only to samples taken after fasting for at least 8 hours.  Haptoglobin     Status: None   Collection Time: 03/09/21 12:40 PM  Result Value Ref Range   Haptoglobin 196 34 - 355 mg/dL    Comment: (NOTE) Performed At: [facility] [place], [place] [id number] Rush Farmer MD [id number]   Lactate dehydrogenase     Status: None   Collection Time: 03/09/21 12:40 PM  Result Value Ref Range   LDH 129 98 - 192 U/L    Comment: Performed at [facility], [address] [place], [place] [address]  Multiple Myeloma Panel (SPEP&IFE w/QIG)     Status: None   Collection Time: 03/09/21 12:40 PM  Result Value Ref Range   IgG (Immunoglobin G), Serum 1,027 603 - 1,613 mg/dL   IgA 286 61 - 437 mg/dL   IgM (Immunoglobulin M), Srm 113 15 - 143 mg/dL   Total Protein ELP 6.8 6.0 - 8.5 g/dL   Albumin SerPl Elph-Mcnc 3.7 2.9 - 4.4 g/dL   Alpha 1 0.3 0.0 - 0.4 g/dL   Alpha2 Glob SerPl Elph-Mcnc 0.8 0.4 - 1.0 g/dL   B-Globulin SerPl Elph-Mcnc 0.9 0.7 - 1.3 g/dL   Gamma Glob SerPl Elph-Mcnc 1.0 0.4 - 1.8 g/dL   M Protein SerPl Elph-Mcnc Not Observed Not Observed g/dL   Globulin, Total 3.1 2.2 - 3.9 g/dL   Albumin/Glob SerPl 1.2 0.7 - 1.7   IFE 1 Comment     Comment: (NOTE) The immunofixation pattern appears unremarkable. Evidence of monoclonal protein is not apparent.    Please Note Comment     Comment:  (NOTE) Protein electrophoresis scan will follow via computer, mail, or courier delivery. Performed At: [facility] [place], [place] [id number] Rush Farmer MD [id number]   CBC with Differential     Status: Abnormal   Collection Time: 03/09/21 12:40 PM  Result Value Ref Range   WBC 5.3 4.0 - 10.5 K/uL   RBC 3.85 (L) 4.22 - 5.81 MIL/uL   Hemoglobin 12.2 (L) 13.0 - 17.0 g/dL   HCT 35.7 (L) 39.0 - 52.0 %   MCV 92.7 80.0 - 100.0 fL   MCH 31.7 26.0 - 34.0 pg   MCHC 34.2 30.0 - 36.0 g/dL   RDW 13.4 11.5 - 15.5 %   Platelets 148 (L) 150 - 400 K/uL   nRBC 0.0 0.0 - 0.2 %   Neutrophils Relative % 56 %   Neutro Abs 3.0 1.7 - 7.7 K/uL   Lymphocytes Relative 29 %   Lymphs Abs 1.5 0.7 - 4.0 K/uL   Monocytes Relative 12 %   Monocytes Absolute 0.6 0.1 - 1.0 K/uL   Eosinophils Relative 2 %   Eosinophils Absolute 0.1 0.0 - 0.5 K/uL   Basophils Relative 0 %   Basophils Absolute 0.0 0.0 - 0.1 K/uL   Immature Granulocytes 1 %  Abs Immature Granulocytes 0.05 0.00 - 0.07 K/uL    Comment: Performed at [facility], [address] [place], [place] [address]  Comprehensive metabolic panel     Status: Abnormal   Collection Time: 03/09/21 12:40 PM  Result Value Ref Range   Sodium 135 135 - 145 mmol/L   Potassium 4.5 3.5 - 5.1 mmol/L   Chloride 100 98 - 111 mmol/L   CO2 28 22 - 32 mmol/L   Glucose, Bld 188 (H) 70 - 99 mg/dL    Comment: Glucose reference range applies only to samples taken after fasting for at least 8 hours.   BUN 23 8 - 23 mg/dL   Creatinine, Ser 1.43 (H) 0.61 - 1.24 mg/dL   Calcium 9.4 8.9 - 10.3 mg/dL   Total Protein 7.3 6.5 - 8.1 g/dL   Albumin 3.9 3.5 - 5.0 g/dL   AST 28 15 - 41 U/L   ALT 24 0 - 44 U/L   Alkaline Phosphatase 62 38 - 126 U/L   Total Bilirubin 0.3 0.3 - 1.2 mg/dL   GFR, Estimated 50 (L) >60 mL/min    Comment: (NOTE) Calculated using the CKD-EPI Creatinine Equation (2021)    Anion gap 7 5 - 15    Comment: Performed at  [facility], [address] [place], [place] [address]  Kappa/lambda light chains     Status: Abnormal   Collection Time: 03/09/21 12:40 PM  Result Value Ref Range   Kappa free light chain 49.4 (H) 3.3 - 19.4 mg/L   Lambda free light chains 34.4 (H) 5.7 - 26.3 mg/L   Kappa, lambda light chain ratio 1.44 0.26 - 1.65    Comment: (NOTE) Performed At: [facility] [place], [place] [id number] Rush Farmer MD [id number]   Reticulocytes     Status: Abnormal   Collection Time: 03/09/21 12:40 PM  Result Value Ref Range   Retic Ct Pct 1.3 0.4 - 3.1 %   RBC. 3.84 (L) 4.22 - 5.81 MIL/uL   Retic Count, Absolute 49.2 19.0 - 186.0 K/uL   Immature Retic Fract 14.0 2.3 - 15.9 %    Comment: Performed at [facility], [address], [place], [place] [address]  Technologist smear review     Status: None   Collection Time: 03/09/21 12:43 PM  Result Value Ref Range   WBC MORPHOLOGY Normal RBC, WBC, and platelet    RBC MORPHOLOGY Normal RBC, WBC, and platelet    Tech Review Normal RBC, WBC, and platelet     Comment: Performed at [facility], [address] [place], [place] [address]  Microalbumin, Urine Waived     Status: Abnormal   Collection Time: 04/04/21 11:21 AM  Result Value Ref Range   Microalb, Ur Waived 80 (H) 0 - 19 mg/L   Creatinine, Urine Waived 300 10 - 300 mg/dL   Microalb/Creat Ratio 30-300 (H) <30 mg/g    Comment:                              Abnormal:       30 - 300                         High Abnormal:           >300   Bayer DCA Hb A1c Waived     Status: Abnormal   Collection Time: 04/04/21  11:21 AM  Result Value Ref Range   HB A1C (BAYER DCA - WAIVED) 6.4 (H) 4.8 - 5.6 %    Comment:          Prediabetes: 5.7 - 6.4          Diabetes: >6.4          Glycemic control for adults with diabetes: <7.0   Comprehensive metabolic panel     Status: Abnormal   Collection Time: 04/04/21 11:23 AM  Result Value Ref Range   Glucose 225  (H) 70 - 99 mg/dL   BUN 22 8 - 27 mg/dL   Creatinine, Ser 1.48 (H) 0.76 - 1.27 mg/dL   eGFR 48 (L) >59 mL/min/1.73   BUN/Creatinine Ratio 15 10 - 24   Sodium 135 134 - 144 mmol/L   Potassium 4.3 3.5 - 5.2 mmol/L   Chloride 99 96 - 106 mmol/L   CO2 25 20 - 29 mmol/L   Calcium 9.4 8.6 - 10.2 mg/dL   Total Protein 6.4 6.0 - 8.5 g/dL   Albumin 3.9 3.7 - 4.7 g/dL   Globulin, Total 2.5 1.5 - 4.5 g/dL   Albumin/Globulin Ratio 1.6 1.2 - 2.2   Bilirubin Total 0.4 0.0 - 1.2 mg/dL   Alkaline Phosphatase 69 44 - 121 IU/L   AST 13 0 - 40 IU/L   ALT 14 0 - 44 IU/L  Lipid Panel w/o Chol/HDL Ratio     Status: None   Collection Time: 04/04/21 11:23 AM  Result Value Ref Range   Cholesterol, Total 101 100 - 199 mg/dL   Triglycerides 96 0 - 149 mg/dL   HDL 40 >39 mg/dL   VLDL Cholesterol Cal 18 5 - 40 mg/dL   LDL Chol Calc (NIH) 43 0 - 99 mg/dL  TSH     Status: None   Collection Time: 04/04/21 11:23 AM  Result Value Ref Range   TSH 4.050 0.450 - 4.500 uIU/mL  Magnesium     Status: None   Collection Time: 04/04/21 11:23 AM  Result Value Ref Range   Magnesium 1.7 1.6 - 2.3 mg/dL    Radiology: CARDIAC CATHETERIZATION  Result Date: 12/22/2020   Prox RCA lesion is 99% stenosed.   Dist LAD lesion is 70% stenosed.   Mid LAD lesion is 50% stenosed.   Mid Cx to Dist Cx lesion is 80% stenosed.   A drug-eluting stent was successfully placed using a STENT ONYX FRONTIER 4.0X18.   Post intervention, there is a 0% residual stenosis.   There is mild left ventricular systolic dysfunction.   The left ventricular ejection fraction is 45-50% by visual estimate. 1.  Non-ST elevation myocardial infarction 2.  Mildly reduced left ventricular function with LVEF 45 to 50% 3.  Three-vessel coronary artery disease with 70% stenosis very distal segment LAD, diffuse 80% stenosis small caliber AV groove left circumflex, and high-grade 99% stenosis proximal RCA 4.  Successful PCI with DES proximal RCA Recommendations 1.  Dual  antiplatelet therapy 2.  Aggressive risk factor modification 3.  Continue metoprolol succinate 4.  Restart Eliquis on 12/23/2020, continue triple therapy for 2 weeks at which time we will discontinue aspirin as outpatient and continue Eliquis and Brilinta   ECHOCARDIOGRAM COMPLETE  Result Date: 12/21/2020    ECHOCARDIOGRAM REPORT   Patient Name:   Alfio Loescher. Date of Exam: 12/21/2020 Medical Rec #:  [id number]        Height:       73.0 in Accession #:  [id number]       Weight:       191.8 lb Date of Birth:  02-05-1941        BSA:          2.114 m Patient Age:    42 years         BP:           157/89 mmHg Patient Gender: M                HR:           56 bpm. Exam Location:  [facility] Procedure: 2D Echo, Color Doppler and Cardiac Doppler Indications:     Acute myocardial infarction -unspecified I21.9  History:         Patient has no prior history of Echocardiogram examinations.                  Arrythmias:Atrial Fibrillation; Risk Factors:Hypertension,                  Diabetes and Dyslipidemia.  Sonographer:     Sherrie Sport Referring Phys:  [place] Diagnosing Phys: [name] MD IMPRESSIONS  1. Left ventricular ejection fraction, by estimation, is 65 to 70%. The left ventricle has normal function. The left ventricle has no regional wall motion abnormalities. There is mild left ventricular hypertrophy. Left ventricular diastolic parameters were normal.  2. Right ventricular systolic function is normal. The right ventricular size is normal.  3. The mitral valve is normal in structure. Trivial mitral valve regurgitation. No evidence of mitral stenosis.  4. The aortic valve is normal in structure. Aortic valve regurgitation is not visualized. No aortic stenosis is present.  5. The inferior vena cava is normal in size with greater than 50% respiratory variability, suggesting right atrial pressure of 3 mmHg. FINDINGS  Left Ventricle: Left ventricular ejection fraction, by estimation, is 65  to 70%. The left ventricle has normal function. The left ventricle has no regional wall motion abnormalities. The left ventricular internal cavity size was normal in size. There is  mild left ventricular hypertrophy. Left ventricular diastolic parameters were normal. Right Ventricle: The right ventricular size is normal. No increase in right ventricular wall thickness. Right ventricular systolic function is normal. Left Atrium: Left atrial size was normal in size. Right Atrium: Right atrial size was normal in size. Pericardium: There is no evidence of pericardial effusion. Mitral Valve: The mitral valve is normal in structure. Trivial mitral valve regurgitation. No evidence of mitral valve stenosis. MV peak gradient, 3.3 mmHg. The mean mitral valve gradient is 1.0 mmHg. Tricuspid Valve: The tricuspid valve is normal in structure. Tricuspid valve regurgitation is trivial. No evidence of tricuspid stenosis. Aortic Valve: The aortic valve is normal in structure. Aortic valve regurgitation is not visualized. No aortic stenosis is present. Aortic valve mean gradient measures 2.0 mmHg. Aortic valve peak gradient measures 3.7 mmHg. Aortic valve area, by VTI measures 3.50 cm. Pulmonic Valve: The pulmonic valve was normal in structure. Pulmonic valve regurgitation is not visualized. No evidence of pulmonic stenosis. Aorta: The aortic root is normal in size and structure. Venous: The inferior vena cava is normal in size with greater than 50% respiratory variability, suggesting right atrial pressure of 3 mmHg. IAS/Shunts: No atrial level shunt detected by color flow Doppler.  LEFT VENTRICLE PLAX 2D LVIDd:         4.43 cm   Diastology LVIDs:         2.82 cm  LV e' medial:    7.18 cm/s LV PW:         1.43 cm   LV E/e' medial:  10.7 LV IVS:        1.29 cm   LV e' lateral:   11.40 cm/s LVOT diam:     2.00 cm   LV E/e' lateral: 6.8 LV SV:         58 LV SV Index:   27 LVOT Area:     3.14 cm  RIGHT VENTRICLE RV Basal diam:  3.17  cm RV S prime:     15.80 cm/s TAPSE (M-mode): 4.8 cm LEFT ATRIUM             Index        RIGHT ATRIUM           Index LA diam:        4.10 cm 1.94 cm/m   RA Area:     21.40 cm LA Vol (A2C):   82.0 ml 38.79 ml/m  RA Volume:   63.50 ml  30.04 ml/m LA Vol (A4C):   54.7 ml 25.88 ml/m LA Biplane Vol: 73.8 ml 34.92 ml/m  AORTIC VALVE                    PULMONIC VALVE AV Area (Vmax):    2.82 cm     PV Vmax:        0.79 m/s AV Area (Vmean):   3.14 cm     PV Vmean:       53.500 cm/s AV Area (VTI):     3.50 cm     PV VTI:         0.148 m AV Vmax:           96.10 cm/s   PV Peak grad:   2.5 mmHg AV Vmean:          62.400 cm/s  PV Mean grad:   1.0 mmHg AV VTI:            0.166 m      RVOT Peak grad: 3 mmHg AV Peak Grad:      3.7 mmHg AV Mean Grad:      2.0 mmHg LVOT Vmax:         86.20 cm/s LVOT Vmean:        62.400 cm/s LVOT VTI:          0.185 m LVOT/AV VTI ratio: 1.11  AORTA Ao Root diam: 3.87 cm MITRAL VALVE               TRICUSPID VALVE MV Area (PHT): 3.33 cm    TR Peak grad:   12.7 mmHg MV Area VTI:   2.30 cm    TR Vmax:        178.00 cm/s MV Peak grad:  3.3 mmHg MV Mean grad:  1.0 mmHg    SHUNTS MV Vmax:       0.90 m/s    Systemic VTI:  0.18 m MV Vmean:      54.6 cm/s   Systemic Diam: 2.00 cm MV Decel Time: 228 msec    Pulmonic VTI:  0.145 m MV E velocity: 77.10 cm/s MV A velocity: 56.10 cm/s MV E/A ratio:  1.37 [name] MD Electronically signed by [name] MD Signature Date/Time: 12/21/2020/2:59:34 PM    Final     No results found.  No results found.    Assessment and Plan: Patient Active Problem List  Diagnosis Date Noted   Diabetes mellitus with proteinuria ([place]) 04/04/2021   Cough 04/04/2021   GERD without esophagitis 04/02/2021   Coronary artery disease 12/28/2020   History of non-ST elevation myocardial infarction (NSTEMI) 12/21/2020   Pain in right shin 10/25/2020   Peripheral vascular disease ([place]) 08/06/2020   Chronic pain syndrome 07/06/2020   Cervical facet  joint syndrome 04/08/2020   Cervicalgia 04/08/2020   Spinal cord stimulator status 12/11/2019   History of 2019 novel coronavirus disease (COVID-19) 10/02/2019   CKD (chronic kidney disease) stage 3, GFR 30-59 ml/min ([place]) 01/19/2019   Acquired thrombophilia ([place]) 01/19/2019   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   Postlaminectomy syndrome, lumbar region 01/16/2019   History of fusion of lumbar spine (L2-L5) 01/16/2019   Chronic radicular lumbar pain 01/16/2019   HNP (herniated nucleus pulposus), lumbar 04/29/2018   Advanced care planning/counseling discussion 09/28/2016   Spinal stenosis, lumbar region, 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/13/2016   Hyperlipidemia associated with type 2 diabetes mellitus ([place]) 07/14/2015   Symptomatic anemia 06/30/2015   Benign prostatic hyperplasia without lower urinary tract symptoms 06/02/2015   OSA (obstructive sleep apnea) 03/23/2015   Hypertension associated with diabetes ([place]) 09/28/2014   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 OSA (obstructive sleep apnea) Pt has a history of OSA- (old PSG not available) and this study was nondiagnostic. Pt will think about repeating the test, but at this point he is unsure. He did not sleep well for the study and no lateral sleep was seen. He would like to try a home study. I think this is reasonable.   2. Hypertension associated with diabetes ([place]) Hypertension Counseling:   The following hypertensive lifestyle modification were recommended and discussed:  1. Limiting alcohol intake to less than 1 oz/day of ethanol:(24 oz of beer or 8 oz of wine or 2 oz of 100-proof whiskey). 2. Take baby ASA 81 mg daily. 3. Importance of regular aerobic exercise and losing weight. 4. Reduce dietary saturated fat and cholesterol intake for overall cardiovascular health. 5. Maintaining adequate dietary  potassium, calcium, and magnesium intake. 6. Regular monitoring of the blood pressure. 7. Reduce sodium intake to less than 100 mmol/day (less than 2.3 gm of sodium or less than 6 gm of sodium choride)    3. AF (paroxysmal atrial fibrillation) ([place]) Reminded of importance of treating OSA in light of same.       General Counseling: I have discussed the findings of the evaluation and examination with Sean Reyes.  I have also discussed any further diagnostic evaluation thatmay be needed or ordered today. Sean Reyes verbalizes understanding of the findings of todays visit. We also reviewed his medications today and discussed drug interactions and side effects including but not limited excessive drowsiness and altered mental states. We also discussed that there is always a risk not just to him but also people around him. he has been encouraged to call the office with any questions or concerns that should arise related to todays visit.  No orders of the defined types were placed in this encounter.       I have personally obtained a history, examined the patient, evaluated laboratory and imaging results, formulated the assessment and plan and placed orders. This patient was seen today by Tressie Ellis, PA-C in collaboration with Dr. Devona Konig.   Allyne Gee, MD [facility] Diplomate ABMS Pulmonary Critical Care Medicine and Sleep Medicine

## 2021-04-26 LAB — COMPREHENSIVE METABOLIC PANEL
ALT: 35 IU/L (ref 0–44)
AST: 27 IU/L (ref 0–40)
Albumin/Globulin Ratio: 1.6 (ref 1.2–2.2)
Albumin: 4 g/dL (ref 3.7–4.7)
Alkaline Phosphatase: 69 IU/L (ref 44–121)
BUN/Creatinine Ratio: 15 (ref 10–24)
BUN: 20 mg/dL (ref 8–27)
Bilirubin Total: 0.3 mg/dL (ref 0.0–1.2)
CO2: 24 mmol/L (ref 20–29)
Calcium: 9.6 mg/dL (ref 8.6–10.2)
Chloride: 101 mmol/L (ref 96–106)
Creatinine, Ser: 1.31 mg/dL — ABNORMAL HIGH (ref 0.76–1.27)
Globulin, Total: 2.5 g/dL (ref 1.5–4.5)
Glucose: 135 mg/dL — ABNORMAL HIGH (ref 70–99)
Potassium: 4.5 mmol/L (ref 3.5–5.2)
Sodium: 140 mmol/L (ref 134–144)
Total Protein: 6.5 g/dL (ref 6.0–8.5)
eGFR: 56 mL/min/{1.73_m2} — ABNORMAL LOW (ref >59)

## 2021-04-26 LAB — CBC WITH DIFFERENTIAL/PLATELET
Basophils Absolute: 0 10*3/uL (ref 0.0–0.2)
Basos: 0 %
EOS (ABSOLUTE): 0.1 10*3/uL (ref 0.0–0.4)
Eos: 1 %
Hematocrit: 36.2 % — ABNORMAL LOW (ref 37.5–51.0)
Hemoglobin: 12.1 g/dL — ABNORMAL LOW (ref 13.0–17.7)
Immature Grans (Abs): 0 10*3/uL (ref 0.0–0.1)
Immature Granulocytes: 0 %
Lymphocytes Absolute: 1.6 10*3/uL (ref 0.7–3.1)
Lymphs: 23 %
MCH: 30.6 pg (ref 26.6–33.0)
MCHC: 33.4 g/dL (ref 31.5–35.7)
MCV: 91 fL (ref 79–97)
Monocytes Absolute: 0.8 10*3/uL (ref 0.1–0.9)
Monocytes: 11 %
Neutrophils Absolute: 4.4 10*3/uL (ref 1.4–7.0)
Neutrophils: 65 %
Platelets: 166 10*3/uL (ref 150–450)
RBC: 3.96 x10E6/uL — ABNORMAL LOW (ref 4.14–5.80)
RDW: 13.1 % (ref 11.6–15.4)
WBC: 6.9 10*3/uL (ref 3.4–10.8)

## 2021-04-26 LAB — FERRITIN: Ferritin: 213 ng/mL (ref 30–400)

## 2021-04-26 LAB — IRON AND TIBC
Iron Saturation: 22 % (ref 15–55)
Iron: 62 ug/dL (ref 38–169)
Total Iron Binding Capacity: 287 ug/dL (ref 250–450)
UIBC: 225 ug/dL (ref 111–343)

## 2021-04-26 LAB — VITAMIN B12: Vitamin B-12: 946 pg/mL (ref 232–1245)

## 2021-04-26 LAB — MAGNESIUM: Magnesium: 1.6 mg/dL (ref 1.6–2.3)

## 2021-04-26 LAB — T4, FREE: Free T4: 1.29 ng/dL (ref 0.82–1.77)

## 2021-04-26 LAB — VITAMIN D 25 HYDROXY (VIT D DEFICIENCY, FRACTURES): Vit D, 25-Hydroxy: 39.8 ng/mL (ref 30.0–100.0)

## 2021-04-26 LAB — TSH: TSH: 4.33 u[IU]/mL (ref 0.450–4.500)

## 2021-04-26 NOTE — Progress Notes (Signed)
Contacted via [place] ? ? ?Good afternoon Sean Reyes, overall all current labs are stable and at baseline for you.  No worsening of anemia or kidney disease.  Iron level stable.  Any questions? ?Keep being amazing!!  Thank you for allowing me to participate in your care.  I appreciate you. ?Kindest regards, ?Corleen Otwell ?

## 2021-04-27 ENCOUNTER — Other Ambulatory Visit: Payer: Self-pay

## 2021-04-27 DIAGNOSIS — Z955 Presence of coronary angioplasty implant and graft: Secondary | ICD-10-CM | POA: Diagnosis not present

## 2021-04-27 DIAGNOSIS — I214 Non-ST elevation (NSTEMI) myocardial infarction: Secondary | ICD-10-CM

## 2021-04-27 NOTE — Progress Notes (Signed)
Daily Session Note ? ?Patient Details  ?Name: Sean Reyes. ?MRN: [id number] ?Date of Birth: 06-10-1940 ?Referring Provider:   ?Flowsheet Row Cardiac Rehab from 01/19/2021 in [facility] Cardiac and Pulmonary Rehab  ?Referring Provider [name] MD  ? ?  ? ? ?Encounter Date: 04/27/2021 ? ?Check In: ? Session Check In - 04/27/21 1354   ? ?  ? Check-In  ? Supervising physician immediately available to respond to emergencies See telemetry face sheet for immediately available ER MD   ? Location [facility]-Cardiac & Pulmonary Rehab   ? Staff Present Birdie Sons, MPA, RN;Laureen Owens Shark, BS, RRT, CPFT;Joseph [place], RCP,RRT,BSRT;Melissa Adak, RDN, LDN;Jessica [place], MA, RCEP, CCRP, CCET   ? Virtual Visit No   ? Medication changes reported     No   ? Fall or balance concerns reported    No   ? Tobacco Cessation No Change   ? Warm-up and Cool-down Performed on first and last piece of equipment   ? Resistance Training Performed Yes   ? VAD Patient? No   ? PAD/SET Patient? No   ?  ? Pain Assessment  ? Currently in Pain? No/denies   ? ?  ?  ? ?  ? ? ? ? ? ?Social History  ? ?Tobacco Use  ?Smoking Status Never  ?Smokeless Tobacco Never  ? ? ?Goals Met:  ?Independence with exercise equipment ?Exercise tolerated well ?No report of concerns or symptoms today ?Strength training completed today ? ?Goals Unmet:  ?Not Applicable ? ?Comments: Pt able to follow exercise prescription today without complaint.  Will continue to monitor for progression. ? ? ? ?Dr. Emily Filbert is Medical Director for [place].  ?Dr. Ottie Glazier is Medical Director for [facility] Pulmonary Rehabilitation. ?

## 2021-04-28 DIAGNOSIS — I480 Paroxysmal atrial fibrillation: Secondary | ICD-10-CM | POA: Diagnosis not present

## 2021-04-28 DIAGNOSIS — Z981 Arthrodesis status: Secondary | ICD-10-CM | POA: Diagnosis not present

## 2021-04-28 DIAGNOSIS — R251 Tremor, unspecified: Secondary | ICD-10-CM | POA: Diagnosis not present

## 2021-04-28 DIAGNOSIS — E118 Type 2 diabetes mellitus with unspecified complications: Secondary | ICD-10-CM | POA: Diagnosis not present

## 2021-04-28 DIAGNOSIS — E559 Vitamin D deficiency, unspecified: Secondary | ICD-10-CM | POA: Diagnosis not present

## 2021-04-28 DIAGNOSIS — G629 Polyneuropathy, unspecified: Secondary | ICD-10-CM | POA: Diagnosis not present

## 2021-04-28 DIAGNOSIS — I25118 Atherosclerotic heart disease of native coronary artery with other forms of angina pectoris: Secondary | ICD-10-CM | POA: Diagnosis not present

## 2021-05-02 ENCOUNTER — Other Ambulatory Visit: Payer: Self-pay

## 2021-05-02 VITALS — Ht 71.5 in | Wt 187.5 lb

## 2021-05-02 DIAGNOSIS — I214 Non-ST elevation (NSTEMI) myocardial infarction: Secondary | ICD-10-CM | POA: Diagnosis not present

## 2021-05-02 DIAGNOSIS — Z955 Presence of coronary angioplasty implant and graft: Secondary | ICD-10-CM

## 2021-05-02 NOTE — Progress Notes (Signed)
Daily Session Note ? ?Patient Details  ?Name: Nyzaiah Kai. ?MRN: [id number] ?Date of Birth: 02-11-1941 ?Referring Provider:   ?Flowsheet Row Cardiac Rehab from 01/19/2021 in [facility] Cardiac and Pulmonary Rehab  ?Referring Provider [name] MD  ? ?  ? ? ?Encounter Date: 05/02/2021 ? ?Check In: ? Session Check In - 05/02/21 1357   ? ?  ? Check-In  ? Supervising physician immediately available to respond to emergencies See telemetry face sheet for immediately available ER MD   ? Location [facility]-Cardiac & Pulmonary Rehab   ? Staff Present Birdie Sons, MPA, RN;Joseph [place], RCP,RRT,BSRT;Kara [place], MS, ASCM CEP, Exercise Physiologist   ? Virtual Visit No   ? Medication changes reported     No   ? Fall or balance concerns reported    No   ? Tobacco Cessation No Change   ? Warm-up and Cool-down Performed on first and last piece of equipment   ? Resistance Training Performed Yes   ? VAD Patient? No   ? PAD/SET Patient? No   ?  ? Pain Assessment  ? Currently in Pain? No/denies   ? ?  ?  ? ?  ? ? ? 6 Minute Walk   ? ? [place] Name 01/19/21 1250 05/02/21 1443  ?  ?  ? 6 Minute Walk  ? Phase Initial Discharge   ? Distance 550 feet 600 feet   ? Distance % Change -- 9.09 %   ? Distance Feet Change -- 50 ft   ? Walk Time 6 minutes 6 minutes   ? # of Rest Breaks 0 0   ? MPH 1.04 1.13   ? METS 1.2 1.22   ? RPE 12 14   ? Perceived Dyspnea  1 1   ? VO2 Peak 4.21 4.27   ? Symptoms Yes (comment) Yes (comment)   ? Comments Fatigue, Back pain 4/10 Bilateral leg weakness   ? Resting HR 58 bpm 60 bpm   ? Resting BP 138/72 126/64   ? Resting Oxygen Saturation  99 % 97 %   ? Exercise Oxygen Saturation  during 6 min walk 98 % 98 %   ? Max Ex. HR 79 bpm 77 bpm   ? Max Ex. BP 146/70 134/64   ? 2 Minute Post BP 140/72 --   ? ?  ?  ? ?  ?  ? ? ?Social History  ? ?Tobacco Use  ?Smoking Status Never  ?Smokeless Tobacco Never  ? ? ?Goals Met:  ?Independence with exercise equipment ?Exercise tolerated well ?No report of concerns or symptoms  today ?Strength training completed today ? ?Goals Unmet:  ?Not Applicable ? ?Comments: Pt able to follow exercise prescription today without complaint.  Will continue to monitor for progression. ? ? ? ?Dr. Emily Filbert is Medical Director for [place].  ?Dr. Ottie Glazier is Medical Director for [facility] Pulmonary Rehabilitation. ?

## 2021-05-04 ENCOUNTER — Encounter: Payer: Self-pay | Admitting: *Deleted

## 2021-05-04 ENCOUNTER — Other Ambulatory Visit: Payer: Self-pay

## 2021-05-04 DIAGNOSIS — Z955 Presence of coronary angioplasty implant and graft: Secondary | ICD-10-CM | POA: Diagnosis not present

## 2021-05-04 DIAGNOSIS — I214 Non-ST elevation (NSTEMI) myocardial infarction: Secondary | ICD-10-CM

## 2021-05-04 NOTE — Progress Notes (Signed)
Daily Session Note ? ?Patient Details  ?Name: Sean Reyes. ?MRN: [id number] ?Date of Birth: 03-19-1940 ?Referring Provider:   ?Flowsheet Row Cardiac Rehab from 01/19/2021 in [facility] Cardiac and Pulmonary Rehab  ?Referring Provider [name] MD  ? ?  ? ? ?Encounter Date: 05/04/2021 ? ?Check In: ? Session Check In - 05/04/21 1407   ? ?  ? Check-In  ? Supervising physician immediately available to respond to emergencies See telemetry face sheet for immediately available ER MD   ? Location [facility]-Cardiac & Pulmonary Rehab   ? Staff Present Birdie Sons, MPA, RN;Joseph [place], RCP,RRT,BSRT;Kara [place], MS, ASCM CEP, Exercise Physiologist   ? Virtual Visit No   ? Medication changes reported     No   ? Fall or balance concerns reported    No   ? Tobacco Cessation No Change   ? Warm-up and Cool-down Performed on first and last piece of equipment   ? Resistance Training Performed Yes   ? VAD Patient? No   ? PAD/SET Patient? No   ?  ? Pain Assessment  ? Currently in Pain? No/denies   ? ?  ?  ? ?  ? ? ? ? ? ?Social History  ? ?Tobacco Use  ?Smoking Status Never  ?Smokeless Tobacco Never  ? ? ?Goals Met:  ?Independence with exercise equipment ?Exercise tolerated well ?No report of concerns or symptoms today ?Strength training completed today ? ?Goals Unmet:  ?Not Applicable ? ?Comments: Pt able to follow exercise prescription today without complaint.  Will continue to monitor for progression. ? ? ? ?Dr. Emily Filbert is Medical Director for [place].  ?Dr. Ottie Glazier is Medical Director for [facility] Pulmonary Rehabilitation. ?

## 2021-05-04 NOTE — Progress Notes (Signed)
Cardiac Individual Treatment Plan ? ?Patient Details  ?Name: Sean Reyes. ?MRN: [id number] ?Date of Birth: October 01, 1940 ?Referring Provider:   ?Flowsheet Row Cardiac Rehab from 01/19/2021 in [facility] Cardiac and Pulmonary Rehab  ?Referring Provider [name] MD  ? ?  ? ? ?Initial Encounter Date:  ?Flowsheet Row Cardiac Rehab from 01/19/2021 in [facility] Cardiac and Pulmonary Rehab  ?Date 01/19/21  ? ?  ? ? ?Visit Diagnosis: NSTEMI (non-ST elevated myocardial infarction) ([place]) ? ?Status post coronary artery stent placement ? ?Patient's Home Medications on Admission: ? ?Current Outpatient Medications:  ?  acetaminophen (TYLENOL) 500 MG tablet, Take 500 mg by mouth every 6 (six) hours as needed (pain)., Disp: , Rfl:  ?  clopidogrel (PLAVIX) 75 MG tablet, Take 75 mg by mouth daily., Disp: , Rfl:  ?  cyanocobalamin 1000 MCG tablet, Take by mouth., Disp: , Rfl:  ?  ELIQUIS 5 MG TABS tablet, Take 1 tablet (5 mg total) by mouth 2 (two) times daily., Disp: 180 tablet, Rfl: 4 ?  finasteride (PROSCAR) 5 MG tablet, Take 1 tablet (5 mg total) by mouth daily., Disp: 90 tablet, Rfl: 2 ?  isosorbide mononitrate (IMDUR) 30 MG 24 hr tablet, Take 30 mg by mouth daily., Disp: , Rfl:  ?  metFORMIN (GLUCOPHAGE) 500 MG tablet, Take 1 tablet (500 mg total) by mouth 2 (two) times daily with a meal., Disp: 180 tablet, Rfl: 4 ?  metoprolol succinate (TOPROL-XL) 25 MG 24 hr tablet, Take 1 tablet by mouth daily., Disp: , Rfl:  ?  omeprazole (PRILOSEC) 40 MG capsule, Take 40 mg by mouth daily., Disp: , Rfl:  ?  propafenone (RYTHMOL SR) 425 MG 12 hr capsule, Take 425 mg by mouth 2 (two) times daily. Has been out for 3 days, Disp: , Rfl:  ?  rosuvastatin (CRESTOR) 5 MG tablet, Take 1 tablet (5 mg total) by mouth daily., Disp: 90 tablet, Rfl: 2 ?  solifenacin (VESICARE) 5 MG tablet, Take 5 mg by mouth daily., Disp: , Rfl:  ?  tamsulosin (FLOMAX) 0.4 MG CAPS capsule, Take 0.8 mg by mouth daily., Disp: , Rfl:  ? ?Past Medical History: ?Past Medical  History:  ?Diagnosis Date  ? Anemia   ? Anxiety   ? Arthritis   ? Arthritis of neck   ? Atrial fibrillation ([place])   ? Cataracts, bilateral   ? Complication of anesthesia   ? pt reports low BP's after surgery at [facility] and difficulty awakening  ? Depression   ? Diabetes ([place])   ? dx 6-8 yrs ago  ? Dysrhythmia   ? a-fib  ? GERD (gastroesophageal reflux disease)   ? [place] TAKES ALKA SELTZER  ? History of kidney stones   ? 10-15 yrs ago  ? 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 Hyperlipidemia   ? Hypertension   ? Myocardial infarction [facility]) 12/2020  ? Nocturia   ? S/P ablation of atrial fibrillation   ? Ablative therapy  ? Sleep apnea   ? CPAP   ? Spinal stenosis   ? Tachycardia, unspecified   ? ? ?Tobacco Use: ?Social History  ? ?Tobacco Use  ?Smoking Status Never  ?Smokeless Tobacco Never  ? ? ?Labs: ?Review Flowsheet   ? ?  ?  Latest Ref Rng & Units 06/10/2020 09/09/2020 12/10/2020 12/21/2020  ?Labs for ITP Cardiac and Pulmonary Rehab  ?Cholestrol 100 - 199 mg/dL  105    94    ?LDL (calc) 0 -  99 mg/dL  43    45    ?HDL-C >39 mg/dL  41    36    ?Trlycerides 0 - 149 mg/dL  113    67    ?Hemoglobin A1c 4.8 - 5.6 % 6.7   6.6   6.5   6.6    ? ?  04/04/2021  ?Labs for ITP Cardiac and Pulmonary Rehab  ?Cholestrol 101    ?LDL (calc) 43    ?HDL-C 40    ?Trlycerides 96    ?Hemoglobin A1c 6.4    ?  ? ? Multiple values from one day are sorted in reverse-chronological order  ?  ?  ? ? ? ?Exercise Target Goals: ?Exercise Program Goal: ?Individual exercise prescription set using results from initial 6 min walk test and THRR while considering  patient?s activity barriers and safety.  ? ?Exercise Prescription Goal: ?Initial exercise prescription builds to 30-45 minutes a day of aerobic activity, 2-3 days per week.  Home exercise guidelines will be given to patient during program as part of exercise prescription that the participant will acknowledge. ? ? ?Education: Aerobic Exercise: ?- Group verbal and visual  presentation on the components of exercise prescription. Introduces F.I.T.T principle from ACSM for exercise prescriptions.  Reviews F.I.T.T. principles of aerobic exercise including progression. Written material given at graduation. ?Flowsheet Row Cardiac Rehab from 04/27/2021 in [facility] Cardiac and Pulmonary Rehab  ?Date 02/09/21  ?Educator AS  ?Instruction Review Code 1- Verbalizes Understanding  ? ?  ? ? ?Education: Resistance Exercise: ?- Group verbal and visual presentation on the components of exercise prescription. Introduces F.I.T.T principle from ACSM for exercise prescriptions  Reviews F.I.T.T. principles of resistance exercise including progression. Written material given at graduation. ?Flowsheet Row Cardiac Rehab from 04/27/2021 in [facility] Cardiac and Pulmonary Rehab  ?Date 02/16/21  ?Educator AS  ?Instruction Review Code 1- Verbalizes Understanding  ? ?  ? ?  ?Education: Exercise & Equipment Safety: ?- Individual verbal instruction and demonstration of equipment use and safety with use of the equipment. ?Flowsheet Row Cardiac Rehab from 04/27/2021 in [facility] Cardiac and Pulmonary Rehab  ?Education need identified 01/19/21  ?Date 01/19/21  ?Educator KL  ?Instruction Review Code 1- Verbalizes Understanding  ? ?  ? ? ?Education: Exercise Physiology & General Exercise Guidelines: ?- Group verbal and written instruction with models to review the exercise physiology of the cardiovascular system and associated critical values. Provides general exercise guidelines with specific guidelines to those with heart or lung disease.  ?Flowsheet Row Cardiac Rehab from 04/27/2021 in [facility] Cardiac and Pulmonary Rehab  ?Education need identified 01/19/21  ? ?  ? ? ?Education: Flexibility, Balance, Mind/Body Relaxation: ?- Group verbal and visual presentation with interactive activity on the components of exercise prescription. Introduces F.I.T.T principle from ACSM for exercise prescriptions. Reviews F.I.T.T. principles of flexibility  and balance exercise training including progression. Also discusses the mind body connection.  Reviews various relaxation techniques to help reduce and manage stress (i.e. Deep breathing, progressive muscle relaxation, and visualization). Balance handout provided to take home. Written material given at graduation. ? ? ?Activity Barriers & Risk Stratification: ? Activity Barriers & Cardiac Risk Stratification - 01/19/21 1245   ? ?  ? Activity Barriers & Cardiac Risk Stratification  ? Activity Barriers Other (comment);Deconditioning;Back Problems;Joint Problems;Left Hip Replacement;Right Hip Replacement;Muscular Weakness;Assistive Device   ? Comments neuropathy; pain stimulator inserted April 2021 for back; 3 hip replacements, neuropathy in legs   ? Cardiac Risk Stratification High   ? ?  ?  ? ?  ? ? ?  6 Minute Walk: ? 6 Minute Walk   ? ? [place] Name 01/19/21 1250 05/02/21 1443  ?  ?  ? 6 Minute Walk  ? Phase Initial Discharge   ? Distance 550 feet 600 feet   ? Distance % Change -- 9.09 %   ? Distance Feet Change -- 50 ft   ? Walk Time 6 minutes 6 minutes   ? # of Rest Breaks 0 0   ? MPH 1.04 1.13   ? METS 1.2 1.22   ? RPE 12 14   ? Perceived Dyspnea  1 1   ? VO2 Peak 4.21 4.27   ? Symptoms Yes (comment) Yes (comment)   ? Comments Fatigue, Back pain 4/10 Bilateral leg weakness   ? Resting HR 58 bpm 60 bpm   ? Resting BP 138/72 126/64   ? Resting Oxygen Saturation  99 % 97 %   ? Exercise Oxygen Saturation  during 6 min walk 98 % 98 %   ? Max Ex. HR 79 bpm 77 bpm   ? Max Ex. BP 146/70 134/64   ? 2 Minute Post BP 140/72 --   ? ?  ?  ? ?  ? ? ?Oxygen Initial Assessment: ? ? ?Oxygen Re-Evaluation: ? ? ?Oxygen Discharge (Final Oxygen Re-Evaluation): ? ? ?Initial Exercise Prescription: ? Initial Exercise Prescription - 01/19/21 1200   ? ?  ? Date of Initial Exercise RX and Referring Provider  ? Date 01/19/21   ? Referring Provider [name] MD   ?  ? Oxygen  ? Maintain Oxygen Saturation 88% or higher   ?  ? Treadmill   ? MPH 1   ? Grade 0   ? Minutes 15   ? METs 1.8   ?  ? Recumbant Bike  ? Level 1   ? RPM 60   ? Watts 15   ? Minutes 15   ? METs 1   ?  ? NuStep  ? Level 1   ? SPM 80   ? Minutes 15   ? METs 1   ?  ? REL-XR  ?

## 2021-05-05 NOTE — Patient Instructions (Signed)
Discharge Patient Instructions ? ?Patient Details  ?Name: Sean Reyes. ?MRN: [id number] ?Date of Birth: Mar 26, 1940 ?Referring Provider:  [name], MD ? ? ?Number of Visits: 92 ? ?Reason for Discharge:  ?Patient reached a stable level of exercise. ?Patient independent in their exercise. ?Patient has met program and personal goals. ? ?Smoking History:  ?Social History  ? ?Tobacco Use  ?Smoking Status Never  ?Smokeless Tobacco Never  ? ? ?Diagnosis:  ?NSTEMI (non-ST elevated myocardial infarction) ([place]) ? ?Status post coronary artery stent placement ? ?Initial Exercise Prescription: ? Initial Exercise Prescription - 01/19/21 1200   ? ?  ? Date of Initial Exercise RX and Referring Provider  ? Date 01/19/21   ? Referring Provider [name] MD   ?  ? Oxygen  ? Maintain Oxygen Saturation 88% or higher   ?  ? Treadmill  ? MPH 1   ? Grade 0   ? Minutes 15   ? METs 1.8   ?  ? Recumbant Bike  ? Level 1   ? RPM 60   ? Watts 15   ? Minutes 15   ? METs 1   ?  ? NuStep  ? Level 1   ? SPM 80   ? Minutes 15   ? METs 1   ?  ? REL-XR  ? Level 1   ? Speed 50   ? Minutes 15   ? METs 1   ?  ? Prescription Details  ? Frequency (times per week) 2   ? Duration Progress to 30 minutes of continuous aerobic without signs/symptoms of physical distress   ?  ? Intensity  ? THRR 40-80% of Max Heartrate 91-125   ? Ratings of Perceived Exertion 11-13   ? Perceived Dyspnea 0-4   ?  ? Progression  ? Progression Continue to progress workloads to maintain intensity without signs/symptoms of physical distress.   ?  ? Resistance Training  ? Training Prescription Yes   ? Weight 3 lb   ? Reps 10-15   ? ?  ?  ? ?  ? ? ?Discharge Exercise Prescription (Final Exercise Prescription Changes): ? Exercise Prescription Changes - 04/25/21 1600   ? ?  ? Response to Exercise  ? Blood Pressure (Admit) 130/62   ? Blood Pressure (Exit) 122/70   ? Heart Rate (Admit) 53 bpm   ? Heart Rate (Exercise) 65 bpm   ? Heart Rate (Exit) 53 bpm   ? Oxygen  Saturation (Admit) 95 %   ? Oxygen Saturation (Exercise) 96 %   ? Oxygen Saturation (Exit) 98 %   ? Rating of Perceived Exertion (Exercise) 12   ? Symptoms none   ? Duration Continue with 30 min of aerobic exercise without signs/symptoms of physical distress.   ? Intensity THRR unchanged   ?  ? Progression  ? Progression Continue to progress workloads to maintain intensity without signs/symptoms of physical distress.   ? Average METs 3.5   ?  ? Resistance Training  ? Training Prescription Yes   ? Weight 3 lb   ? Reps 10-15   ?  ? Interval Training  ? Interval Training No   ?  ? REL-XR  ? Level 2   ? Minutes 30   ? METs 3.5   ?  ? Home Exercise Plan  ? Plans to continue exercise at Home (comment)   walking, chair exercises  ? Frequency Add 2 additional days to program exercise sessions.   ? Initial  Home Exercises Provided 02/21/21   ? ?  ?  ? ?  ? ? ?Functional Capacity: ? 6 Minute Walk   ? ? [place] Name 01/19/21 1250 05/02/21 1443  ?  ?  ? 6 Minute Walk  ? Phase Initial Discharge   ? Distance 550 feet 600 feet   ? Distance % Change -- 9.09 %   ? Distance Feet Change -- 50 ft   ? Walk Time 6 minutes 6 minutes   ? # of Rest Breaks 0 0   ? MPH 1.04 1.13   ? METS 1.2 1.22   ? RPE 12 14   ? Perceived Dyspnea  1 1   ? VO2 Peak 4.21 4.27   ? Symptoms Yes (comment) Yes (comment)   ? Comments Fatigue, Back pain 4/10 Bilateral leg weakness   ? Resting HR 58 bpm 60 bpm   ? Resting BP 138/72 126/64   ? Resting Oxygen Saturation  99 % 97 %   ? Exercise Oxygen Saturation  during 6 min walk 98 % 98 %   ? Max Ex. HR 79 bpm 77 bpm   ? Max Ex. BP 146/70 134/64   ? 2 Minute Post BP 140/72 --   ? ?  ?  ? ?  ? ? ? ?Nutrition & Weight - Outcomes: ? Pre Biometrics - 01/19/21 1245   ? ?  ? Pre Biometrics  ? Height 5' 11.5" (1.816 m)   ? Weight 189 lb 11.2 oz (86 kg)   ? BMI (Calculated) 26.09   ? Single Leg Stand 2.3 seconds   ? ?  ?  ? ?  ? ? Post Biometrics - 05/02/21 1444   ? ?  ?  Post  Biometrics  ? Height 5' 11.5" (1.816 m)   ? Weight  187 lb 8 oz (85 kg)   ? BMI (Calculated) 25.79   ? ?  ?  ? ?  ? ? ?Nutrition: ? Nutrition Therapy & Goals - 01/31/21 1327   ? ?  ? Nutrition Therapy  ? Diet Heart healthy, low Na, T2DM   ? Drug/Food Interactions Statins/Certain Fruits   ? Protein (specify units) 70g   ? Fiber 30 grams   ? Whole Grain Foods 3 servings   ? Saturated Fats 12 max. grams   ? Fruits and Vegetables 8 servings/day   ? Sodium 1.5 grams   ?  ? Personal Nutrition Goals  ? Nutrition Goal ST: eat salads at least 3x/week ([place] suggested this would be a good option as he enjoys salads), make sure dressing has <5g saturated fat per serving LT: limit going out to eat <2-3x/week, limit saturated fat <12-16g/day, include 5 fruit/vegetable servings per day   ? Comments 81 y.o. M admitted to cardiac rehab s/p stent placement also presents with T2DM, HLD, HTN, GERD, CAD, NSTEMI (12/20/20). Last A1c 6.6%. PYP: 17. Patient reports that he does not cook a lot; his wife needs two hip replacements. He can stand next to the counter, but not to prepare a full meal - he doesn't normally cook either. B: cereal (frosted flakes, raisin bran, frosted mini wheats, cheerios) - 2% milk. Decaf coffee with splenda. S: biscotti cookie with peanut butter L: sandwich (chicken salad on sourdough today), pimento cheese, roast beef, ham, chicken salad on white wheat. May eat one cookie. S: clementines  D: sometimes another sandwich, take out - chicken usually or fish restaurant (potato), sometimes a chicken salad. S: ice cream (a  couple of scoops) Drinks: Sports coach, sometimes a sierra mist. He tries to limit his caffiene. He reports feeling that his diet needs some changes and is willing to do what he can while cooking very little. Discussed with patient that cooking at home can allow for more control over what he is eating - offered to help with meal planning and cooking strategies to avoid fatigue such as sitting down while chopping vegetables. Discussed heart  healthy eating and diabetes friendly eating.   ?  ? Intervention Plan  ? Intervention Prescribe, educate and counsel regarding individualized specific dietary modifications aiming towards targeted core components such as weight, hypertension, lipid management, diabetes, heart failure and other comorbidities.   ? Expected Outcomes Short Term Goal: Understand basic principles of dietary content, such as calories, fat, sodium, cholesterol and nutrients.;Short Term Goal: A plan has been developed with personal nutrition goals set during dietitian appointment.;Long Term Goal: Adherence to prescribed nutrition plan.   ? ?  ?  ? ?  ? ? ? ? ?Goals reviewed with patient; copy given to patient. ?

## 2021-05-06 ENCOUNTER — Other Ambulatory Visit: Payer: Self-pay

## 2021-05-06 ENCOUNTER — Encounter: Payer: Medicare Other | Admitting: *Deleted

## 2021-05-06 DIAGNOSIS — Z955 Presence of coronary angioplasty implant and graft: Secondary | ICD-10-CM | POA: Diagnosis not present

## 2021-05-06 DIAGNOSIS — I214 Non-ST elevation (NSTEMI) myocardial infarction: Secondary | ICD-10-CM

## 2021-05-06 NOTE — Progress Notes (Signed)
Daily Session Note ? ?Patient Details  ?Name: Sean Reyes. ?MRN: [id number] ?Date of Birth: 05-18-1940 ?Referring Provider:   ?Flowsheet Row Cardiac Rehab from 01/19/2021 in [facility] Cardiac and Pulmonary Rehab  ?Referring Provider [name] MD  ? ?  ? ? ?Encounter Date: 05/06/2021 ? ?Check In: ? Session Check In - 05/06/21 1118   ? ?  ? Check-In  ? Supervising physician immediately available to respond to emergencies See telemetry face sheet for immediately available ER MD   ? Location [facility]-Cardiac & Pulmonary Rehab   ? Staff Present Renita Papa, RN BSN;Joseph [place], RCP,RRT,BSRT;Jessica [place], [place], [place], [place], CCET   ? Virtual Visit No   ? Medication changes reported     No   ? Fall or balance concerns reported    No   ? Warm-up and Cool-down Performed on first and last piece of equipment   ? Resistance Training Performed Yes   ? VAD Patient? No   ? PAD/SET Patient? No   ?  ? Pain Assessment  ? Currently in Pain? No/denies   ? ?  ?  ? ?  ? ? ? ? ? ?Social History  ? ?Tobacco Use  ?Smoking Status Never  ?Smokeless Tobacco Never  ? ? ?Goals Met:  ?Independence with exercise equipment ?Exercise tolerated well ?No report of concerns or symptoms today ?Strength training completed today ? ?Goals Unmet:  ?Not Applicable ? ?Comments: Pt able to follow exercise prescription today without complaint.  Will continue to monitor for progression. ? ? ? ?Dr. Emily Filbert is Medical Director for [facility].  ?Dr. Ottie Glazier is Medical Director for [facility] Pulmonary Rehabilitation. ?

## 2021-05-11 DIAGNOSIS — Z955 Presence of coronary angioplasty implant and graft: Secondary | ICD-10-CM

## 2021-05-11 DIAGNOSIS — I214 Non-ST elevation (NSTEMI) myocardial infarction: Secondary | ICD-10-CM | POA: Diagnosis not present

## 2021-05-11 NOTE — Progress Notes (Signed)
Daily Session Note ? ?Patient Details  ?Name: Sean Reyes. ?MRN: [id number] ?Date of Birth: 02-21-1940 ?Referring Provider:   ?Flowsheet Row Cardiac Rehab from 01/19/2021 in [facility] Cardiac and Pulmonary Rehab  ?Referring Provider [name] MD  ? ?  ? ? ?Encounter Date: 05/11/2021 ? ?Check In: ? Session Check In - 05/11/21 1358   ? ?  ? Check-In  ? Supervising physician immediately available to respond to emergencies See telemetry face sheet for immediately available ER MD   ? Location [facility]-Cardiac & Pulmonary Rehab   ? Staff Present Birdie Sons, MPA, Nino Glow, MS, ASCM CEP, Exercise Physiologist;Joseph [place], [place]   ? Virtual Visit No   ? Medication changes reported     Yes   ? Comments 12.61m metoprolol daily (was taking 265m   ? Fall or balance concerns reported    No   ? Tobacco Cessation No Change   ? Warm-up and Cool-down Performed on first and last piece of equipment   ? Resistance Training Performed Yes   ? VAD Patient? No   ? PAD/SET Patient? No   ?  ? Pain Assessment  ? Currently in Pain? No/denies   ? ?  ?  ? ?  ? ? ? ? ? ?Social History  ? ?Tobacco Use  ?Smoking Status Never  ?Smokeless Tobacco Never  ? ? ?Goals Met:  ?Independence with exercise equipment ?Exercise tolerated well ?No report of concerns or symptoms today ?Strength training completed today ? ?Goals Unmet:  ?Not Applicable ? ?Comments: Pt able to follow exercise prescription today without complaint.  Will continue to monitor for progression. ? ? ? ?Dr. MaEmily Filberts Medical Director for [facility] ?Dr. FuOttie Glaziers Medical Director [facility]. ?

## 2021-05-12 DIAGNOSIS — G4733 Obstructive sleep apnea (adult) (pediatric): Secondary | ICD-10-CM | POA: Diagnosis not present

## 2021-05-13 DIAGNOSIS — E1143 Type 2 diabetes mellitus with diabetic autonomic (poly)neuropathy: Secondary | ICD-10-CM | POA: Diagnosis not present

## 2021-05-13 DIAGNOSIS — E1169 Type 2 diabetes mellitus with other specified complication: Secondary | ICD-10-CM | POA: Diagnosis not present

## 2021-05-13 DIAGNOSIS — E1159 Type 2 diabetes mellitus with other circulatory complications: Secondary | ICD-10-CM

## 2021-05-13 DIAGNOSIS — I152 Hypertension secondary to endocrine disorders: Secondary | ICD-10-CM | POA: Diagnosis not present

## 2021-05-13 DIAGNOSIS — E785 Hyperlipidemia, unspecified: Secondary | ICD-10-CM

## 2021-05-13 DIAGNOSIS — I480 Paroxysmal atrial fibrillation: Secondary | ICD-10-CM

## 2021-05-14 NOTE — Patient Instructions (Signed)
Fatigue ?If you have fatigue, you feel tired all the time and have a lack of energy or a lack of motivation. Fatigue may make it difficult to start or complete tasks because of exhaustion. In general, occasional or mild fatigue is often a normal response to activity or life. However, long-lasting (chronic) or extreme fatigue may be a symptom of a medical condition. ?Follow these instructions at home: ?General instructions ?Watch your fatigue for any changes. ?Go to bed and get up at the same time every day. ?Avoid fatigue by pacing yourself during the day and getting enough sleep at night. ?Maintain a healthy weight. ?Medicines ?Take over-the-counter and prescription medicines only as told by your health care provider. ?Take a multivitamin, if told by your health care provider.  ?Do not use herbal or dietary supplements unless they are approved by your health care provider. ?Activity ? ?Exercise regularly, as told by your health care provider. ?Use or practice techniques to help you relax, such as yoga, tai chi, meditation, or massage therapy. ?Eating and drinking ? ?Avoid heavy meals in the evening. ?Eat a well-balanced diet, which includes lean proteins, whole grains, plenty of fruits and vegetables, and low-fat dairy products. ?Avoid consuming too much caffeine. ?Avoid the use of alcohol. ?Drink enough fluid to keep your urine pale yellow. ?Lifestyle ?Change situations that cause you stress. Try to keep your work and personal schedule in balance. ?Do not use any products that contain nicotine or tobacco, such as cigarettes and e-cigarettes. If you need help quitting, ask your health care provider. ?Do not use drugs. ?Contact a health care provider if: ?Your fatigue does not get better. ?You have a fever. ?You suddenly lose or gain weight. ?You have headaches. ?You have trouble falling asleep or sleeping through the night. ?You feel angry, guilty, anxious, or sad. ?You are unable to have a bowel movement  (constipation). ?Your skin is dry. ?You have swelling in your legs or another part of your body. ?Get help right away if: ?You feel confused. ?Your vision is blurry. ?You feel faint or you pass out. ?You have a severe headache. ?You have severe pain in your abdomen, your back, or the area between your waist and hips (pelvis). ?You have chest pain, shortness of breath, or an irregular or fast heartbeat. ?You are unable to urinate, or you urinate less than normal. ?You have abnormal bleeding, such as bleeding from the rectum, vagina, nose, lungs, or nipples. ?You vomit blood. ?You have thoughts about hurting yourself or others. ?If you ever feel like you may hurt yourself or others, or have thoughts about taking your own life, get help right away. You can go to your nearest emergency department or call: ?Your local emergency services (911 in the [place] ?A suicide crisis helpline, such as the National Suicide Prevention Lifeline at 1-800-273-8255 or 988 in the [place] This is open 24 hours a day. ?Summary ?If you have fatigue, you feel tired all the time and have a lack of energy or a lack of motivation. ?Fatigue may make it difficult to start or complete tasks because of exhaustion. ?Long-lasting (chronic) or extreme fatigue may be a symptom of a medical condition. ?Exercise regularly, as told by your health care provider. ?Change situations that cause you stress. Try to keep your work and personal schedule in balance. ?This information is not intended to replace advice given to you by your health care provider. Make sure you discuss any questions you have with your health care provider. ?Document Revised:   08/25/2020 Document Reviewed: 12/11/2019 ?Elsevier Patient Education ? 2022 [employer] ? ?

## 2021-05-18 ENCOUNTER — Encounter: Payer: Self-pay | Admitting: Nurse Practitioner

## 2021-05-18 ENCOUNTER — Ambulatory Visit (INDEPENDENT_AMBULATORY_CARE_PROVIDER_SITE_OTHER): Payer: Medicare Other | Admitting: Nurse Practitioner

## 2021-05-18 ENCOUNTER — Encounter: Payer: Medicare Other | Attending: Cardiology

## 2021-05-18 VITALS — BP 115/71 | HR 71 | Temp 97.7°F | Ht 71.5 in | Wt 187.0 lb

## 2021-05-18 DIAGNOSIS — Z955 Presence of coronary angioplasty implant and graft: Secondary | ICD-10-CM | POA: Diagnosis not present

## 2021-05-18 DIAGNOSIS — R0602 Shortness of breath: Secondary | ICD-10-CM | POA: Diagnosis not present

## 2021-05-18 DIAGNOSIS — R5383 Other fatigue: Secondary | ICD-10-CM | POA: Diagnosis not present

## 2021-05-18 DIAGNOSIS — Z48812 Encounter for surgical aftercare following surgery on the circulatory system: Secondary | ICD-10-CM | POA: Diagnosis not present

## 2021-05-18 DIAGNOSIS — I252 Old myocardial infarction: Secondary | ICD-10-CM

## 2021-05-18 DIAGNOSIS — I214 Non-ST elevation (NSTEMI) myocardial infarction: Secondary | ICD-10-CM

## 2021-05-18 DIAGNOSIS — E1143 Type 2 diabetes mellitus with diabetic autonomic (poly)neuropathy: Secondary | ICD-10-CM | POA: Diagnosis not present

## 2021-05-18 DIAGNOSIS — G4733 Obstructive sleep apnea (adult) (pediatric): Secondary | ICD-10-CM | POA: Diagnosis not present

## 2021-05-18 MED ORDER — PREGABALIN 25 MG PO CAPS: 25.0000 mg | ORAL_CAPSULE | Freq: Every day | ORAL | 1 refills | Status: DC

## 2021-05-18 NOTE — Assessment & Plan Note (Signed)
Ongoing issue with worsening post STEMI.  Labs today BNP and Testosterone.  Suspect multifactorial with main issue being need for new CPAP and neuropathy, although concern for heart issues presenting due to new SBOE -- he is going to call and schedule with cardiology ASAP. Discussed at length with him today. ?

## 2021-05-18 NOTE — Progress Notes (Signed)
Discharge Progress Report ? ?Patient Details  ?Name: Sean Reyes. ?MRN: [id number] ?Date of Birth: 10/14/1940 ?Referring Provider:   ?Flowsheet Row Cardiac Rehab from 01/19/2021 in [facility] Cardiac and Pulmonary Rehab  ?Referring Provider [name] MD  ? ?  ? ? ? ?Number of Visits: 1 ? ?Reason for Discharge:  ?Patient reached a stable level of exercise. ?Patient independent in their exercise. ?Patient has met program and personal goals. ? ?Smoking History:  ?Social History  ? ?Tobacco Use  ?Smoking Status Never  ?Smokeless Tobacco Never  ? ? ?Diagnosis:  ?NSTEMI (non-ST elevated myocardial infarction) ([place]) ? ?Status post coronary artery stent placement ? ?ADL UCSD: ? ? ?Initial Exercise Prescription: ? Initial Exercise Prescription - 01/19/21 1200   ? ?  ? Date of Initial Exercise RX and Referring Provider  ? Date 01/19/21   ? Referring Provider [name] MD   ?  ? Oxygen  ? Maintain Oxygen Saturation 88% or higher   ?  ? Treadmill  ? MPH 1   ? Grade 0   ? Minutes 15   ? METs 1.8   ?  ? Recumbant Bike  ? Level 1   ? RPM 60   ? Watts 15   ? Minutes 15   ? METs 1   ?  ? NuStep  ? Level 1   ? SPM 80   ? Minutes 15   ? METs 1   ?  ? REL-XR  ? Level 1   ? Speed 50   ? Minutes 15   ? METs 1   ?  ? Prescription Details  ? Frequency (times per week) 2   ? Duration Progress to 30 minutes of continuous aerobic without signs/symptoms of physical distress   ?  ? Intensity  ? THRR 40-80% of Max Heartrate 91-125   ? Ratings of Perceived Exertion 11-13   ? Perceived Dyspnea 0-4   ?  ? Progression  ? Progression Continue to progress workloads to maintain intensity without signs/symptoms of physical distress.   ?  ? Resistance Training  ? Training Prescription Yes   ? Weight 3 lb   ? Reps 10-15   ? ?  ?  ? ?  ? ? ?Discharge Exercise Prescription (Final Exercise Prescription Changes): ? Exercise Prescription Changes - 05/09/21 1400   ? ?  ? Response to Exercise  ? Blood Pressure (Admit) 132/72   ? Blood Pressure  (Exit) 110/62   ? Heart Rate (Admit) 72 bpm   ? Heart Rate (Exercise) 80 bpm   ? Heart Rate (Exit) 70 bpm   ? Oxygen Saturation (Admit) 97 %   ? Oxygen Saturation (Exercise) 97 %   ? Rating of Perceived Exertion (Exercise) 12   ? Symptoms none   ? Duration Continue with 30 min of aerobic exercise without signs/symptoms of physical distress.   ? Intensity THRR unchanged   ?  ? Progression  ? Progression Continue to progress workloads to maintain intensity without signs/symptoms of physical distress.   ? Average METs 2.95   ?  ? Resistance Training  ? Training Prescription Yes   ? Weight 3 lb   ? Reps 10-15   ?  ? Interval Training  ? Interval Training No   ?  ? Biostep-RELP  ? Level 2   ? Minutes 15   ? METs 2   ?  ? Home Exercise Plan  ? Plans to continue exercise at Home (comment)   walking,  chair exercises  ? Frequency Add 2 additional days to program exercise sessions.   ? Initial Home Exercises Provided 02/21/21   ? ?  ?  ? ?  ? ? ?Functional Capacity: ? 6 Minute Walk   ? ? [place] Name 01/19/21 1250 05/02/21 1443  ?  ?  ? 6 Minute Walk  ? Phase Initial Discharge   ? Distance 550 feet 600 feet   ? Distance % Change -- 9.09 %   ? Distance Feet Change -- 50 ft   ? Walk Time 6 minutes 6 minutes   ? # of Rest Breaks 0 0   ? MPH 1.04 1.13   ? METS 1.2 1.22   ? RPE 12 14   ? Perceived Dyspnea  1 1   ? VO2 Peak 4.21 4.27   ? Symptoms Yes (comment) Yes (comment)   ? Comments Fatigue, Back pain 4/10 Bilateral leg weakness   ? Resting HR 58 bpm 60 bpm   ? Resting BP 138/72 126/64   ? Resting Oxygen Saturation  99 % 97 %   ? Exercise Oxygen Saturation  during 6 min walk 98 % 98 %   ? Max Ex. HR 79 bpm 77 bpm   ? Max Ex. BP 146/70 134/64   ? 2 Minute Post BP 140/72 --   ? ?  ?  ? ?  ? ? ?Psychological, QOL, Others - Outcomes: ?PHQ 2/9: ? ?  05/18/2021  ?  3:47 PM 04/25/2021  ?  1:28 PM 04/04/2021  ? 11:18 AM 01/19/2021  ? 12:48 PM 10/25/2020  ?  4:15 PM  ?Depression screen PHQ 2/9  ?Decreased Interest 0 0 0 0 0  ?Down, Depressed,  Hopeless 0 0 0 0 0  ?PHQ - 2 Score 0 0 0 0 0  ?Altered sleeping 0 0 0 0   ?Tired, decreased energy 3 2 1 2    ?Change in appetite 0 0 0 0   ?Feeling bad or failure about yourself  0 0 0 0   ?Trouble concentrating 0 0 0 0   ?Moving slowly or fidgety/restless 0 0 0 0   ?Suicidal thoughts 0 0 0 0   ?PHQ-9 Score 3 2 1 2    ?Difficult doing work/chores Extremely dIfficult   Not difficult at all   ? ? ?Quality of Life: ? Quality of Life - 05/18/21 1545   ? ?  ? Quality of Life Scores  ? Health/Function Pre 14.6 %   ? Health/Function Post 14.27 %   ? Health/Function % Change -2.26 %   ? Socioeconomic Pre 28 %   ? Socioeconomic Post 29.38 %   ? Socioeconomic % Change  4.93 %   ? Psych/Spiritual Pre 29.64 %   ? Psych/Spiritual Post 28.79 %   ? Psych/Spiritual % Change -2.87 %   ? Family Pre 15.6 %   ? Family Post 21.6 %   ? Family % Change 38.46 %   ? GLOBAL Pre 20.14 %   ? GLOBAL Post 21.67 %   ? GLOBAL % Change 7.6 %   ? ?  ?  ? ?  ? ? ? ?Nutrition & Weight - Outcomes: ? Pre Biometrics - 01/19/21 1245   ? ?  ? Pre Biometrics  ? Height 5' 11.5" (1.816 m)   ? Weight 189 lb 11.2 oz (86 kg)   ? BMI (Calculated) 26.09   ? Single Leg Stand 2.3 seconds   ? ?  ?  ? ?  ? ?  Post Biometrics - 05/02/21 1444   ? ?  ?  Post  Biometrics  ? Height 5' 11.5" (1.816 m)   ? Weight 187 lb 8 oz (85 kg)   ? BMI (Calculated) 25.79   ? ?  ?  ? ?  ? ? ?Nutrition: ? Nutrition Therapy & Goals - 01/31/21 1327   ? ?  ? Nutrition Therapy  ? Diet Heart healthy, low Na, T2DM   ? Drug/Food Interactions Statins/Certain Fruits   ? Protein (specify units) 70g   ? Fiber 30 grams   ? Whole Grain Foods 3 servings   ? Saturated Fats 12 max. grams   ? Fruits and Vegetables 8 servings/day   ? Sodium 1.5 grams   ?  ? Personal Nutrition Goals  ? Nutrition Goal ST: eat salads at least 3x/week (Samarth suggested this would be a good option as he enjoys salads), make sure dressing has <5g saturated fat per serving LT: limit going out to eat <2-3x/week, limit saturated fat  <12-16g/day, include 5 fruit/vegetable servings per day   ? Comments 81 y.o. M admitted to cardiac rehab s/p stent placement also presents with T2DM, HLD, HTN, GERD, CAD, NSTEMI (12/20/20). Last A1c 6.6%. PYP: 35. Patient reports that he does not cook a lot; his wife needs two hip replacements. He can stand next to the counter, but not to prepare a full meal - he doesn't normally cook either. B: cereal (frosted flakes, raisin bran, frosted mini wheats, cheerios) - 2% milk. Decaf coffee with splenda. S: biscotti cookie with peanut butter L: sandwich (chicken salad on sourdough today), pimento cheese, roast beef, ham, chicken salad on white wheat. May eat one cookie. S: clementines  D: sometimes another sandwich, take out - chicken usually or fish restaurant (potato), sometimes a chicken salad. S: ice cream (a couple of scoops) Drinks: Sports coach, sometimes a sierra mist. He tries to limit his caffiene. He reports feeling that his diet needs some changes and is willing to do what he can while cooking very little. Discussed with patient that cooking at home can allow for more control over what he is eating - offered to help with meal planning and cooking strategies to avoid fatigue such as sitting down while chopping vegetables. Discussed heart healthy eating and diabetes friendly eating.   ?  ? Intervention Plan  ? Intervention Prescribe, educate and counsel regarding individualized specific dietary modifications aiming towards targeted core components such as weight, hypertension, lipid management, diabetes, heart failure and other comorbidities.   ? Expected Outcomes Short Term Goal: Understand basic principles of dietary content, such as calories, fat, sodium, cholesterol and nutrients.;Short Term Goal: A plan has been developed with personal nutrition goals set during dietitian appointment.;Long Term Goal: Adherence to prescribed nutrition plan.   ? ?  ?  ? ?  ? ? ?Nutrition Discharge: ? ? ?Education  Questionnaire Score: ? Knowledge Questionnaire Score - 05/18/21 1545   ? ?  ? Knowledge Questionnaire Score  ? Pre Score 22/26: MI, Nitro, Exercise, A&P   ? Post Score 21/26   ? ?  ?  ? ?  ? ? ?Goals reviewed wi

## 2021-05-18 NOTE — Progress Notes (Signed)
Daily Session Note ? ?Patient Details  ?Name: Sean Reyes. ?MRN: [id number] ?Date of Birth: [birth year] ?Referring Provider:   ?Flowsheet Row Cardiac Rehab from 01/19/2021 in [facility] Cardiac and Pulmonary Rehab  ?Referring Provider Sean Cowman MD  ? ?  ? ? ?Encounter Date: 05/18/2021 ? ?Check In: ? Session Check In - 05/18/21 1353   ? ?  ? Check-In  ? Supervising physician immediately available to respond to emergencies See telemetry face sheet for immediately available ER MD   ? Location [facility]-Cardiac & Pulmonary Rehab   ? Staff Present Birdie Sons, MPA, Nino Glow, MS, ASCM CEP, Exercise Physiologist;Joseph [place], [place]   ? Virtual Visit No   ? Medication changes reported     No   ? Fall or balance concerns reported    No   ? Tobacco Cessation No Change   ? Warm-up and Cool-down Performed on first and last piece of equipment   ? Resistance Training Performed Yes   ? VAD Patient? No   ? PAD/SET Patient? No   ?  ? Pain Assessment  ? Currently in Pain? No/denies   ? ?  ?  ? ?  ? ? ? ? ? ?Social History  ? ?Tobacco Use  ?Smoking Status Never  ?Smokeless Tobacco Never  ? ? ?Goals Met:  ?Independence with exercise equipment ?Exercise tolerated well ?No report of concerns or symptoms today ?Strength training completed today ? ?Goals Unmet:  ?Not Applicable ? ?Comments:  Sean Reyes graduated today from  rehab with 36 sessions completed.  Details of the patient's exercise prescription and what He needs to do in order to continue the prescription and progress were discussed with patient.  Patient was given a copy of prescription and goals.  Patient verbalized understanding.  Sean Reyes plans to continue to exercise by walking and doing chair exercises. ? ? ? ? ?Dr. Emily Filbert is Medical Director for [facility].  ?Dr. Ottie Glazier is Medical Director for [facility] Pulmonary Rehabilitation. ?

## 2021-05-18 NOTE — Progress Notes (Signed)
Cardiac Individual Treatment Plan ? ?Patient Details  ?Name: Sean Reyes. ?MRN: [id number] ?Date of Birth: [birth year] ?Referring Provider:   ?Flowsheet Row Cardiac Rehab from 01/19/2021 in [facility] Cardiac and Pulmonary Rehab  ?Referring Provider [name] MD  ? ?  ? ? ?Initial Encounter Date:  ?Flowsheet Row Cardiac Rehab from 01/19/2021 in [facility] Cardiac and Pulmonary Rehab  ?Date 01/19/21  ? ?  ? ? ?Visit Diagnosis: NSTEMI (non-ST elevated myocardial infarction) ([place]) ? ?Status post coronary artery stent placement ? ?Patient's Home Medications on Admission: ? ?Current Outpatient Medications:  ?  acetaminophen (TYLENOL) 500 MG tablet, Take 500 mg by mouth every 6 (six) hours as needed (pain)., Disp: , Rfl:  ?  clopidogrel (PLAVIX) 75 MG tablet, Take 75 mg by mouth daily., Disp: , Rfl:  ?  cyanocobalamin 1000 MCG tablet, Take by mouth., Disp: , Rfl:  ?  ELIQUIS 5 MG TABS tablet, Take 1 tablet (5 mg total) by mouth 2 (two) times daily., Disp: 180 tablet, Rfl: 4 ?  finasteride (PROSCAR) 5 MG tablet, Take 1 tablet (5 mg total) by mouth daily., Disp: 90 tablet, Rfl: 2 ?  isosorbide mononitrate (IMDUR) 30 MG 24 hr tablet, Take 30 mg by mouth daily., Disp: , Rfl:  ?  metFORMIN (GLUCOPHAGE) 500 MG tablet, Take 1 tablet (500 mg total) by mouth 2 (two) times daily with a meal., Disp: 180 tablet, Rfl: 4 ?  metoprolol succinate (TOPROL-XL) 25 MG 24 hr tablet, Take 0.5 tablets by mouth daily., Disp: , Rfl:  ?  omeprazole (PRILOSEC) 40 MG capsule, Take 40 mg by mouth daily., Disp: , Rfl:  ?  pregabalin (LYRICA) 25 MG capsule, Take 1 capsule (25 mg total) by mouth daily., Disp: 90 capsule, Rfl: 1 ?  propafenone (RYTHMOL SR) 425 MG 12 hr capsule, Take 425 mg by mouth 2 (two) times daily. Has been out for 3 days, Disp: , Rfl:  ?  rosuvastatin (CRESTOR) 5 MG tablet, Take 1 tablet (5 mg total) by mouth daily., Disp: 90 tablet, Rfl: 2 ?  solifenacin (VESICARE) 5 MG tablet, Take 5 mg by mouth daily., Disp: , Rfl:  ?  tamsulosin  (FLOMAX) 0.4 MG CAPS capsule, Take 0.8 mg by mouth daily., Disp: , Rfl:  ? ?Past Medical History: ?Past Medical History:  ?Diagnosis Date  ? Anemia   ? Anxiety   ? Arthritis   ? Arthritis of neck   ? Atrial fibrillation ([place])   ? Cataracts, bilateral   ? Complication of anesthesia   ? pt reports low BP's after surgery at [facility] and difficulty awakening  ? Depression   ? Diabetes ([place])   ? dx 6-8 yrs ago  ? Dysrhythmia   ? a-fib  ? GERD (gastroesophageal reflux disease)   ? [place] TAKES ALKA SELTZER  ? History of kidney stones   ? 10-15 yrs ago  ? 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 Hyperlipidemia   ? Hypertension   ? Myocardial infarction [facility]) 12/2020  ? Nocturia   ? S/P ablation of atrial fibrillation   ? Ablative therapy  ? Sleep apnea   ? CPAP   ? Spinal stenosis   ? Tachycardia, unspecified   ? ? ?Tobacco Use: ?Social History  ? ?Tobacco Use  ?Smoking Status Never  ?Smokeless Tobacco Never  ? ? ?Labs: ?Review Flowsheet   ? ?  ?  Latest Ref Rng & Units 06/10/2020 09/09/2020 12/10/2020 12/21/2020  ?Labs for ITP Cardiac and  Pulmonary Rehab  ?Cholestrol 100 - 199 mg/dL  105    94    ?LDL (calc) 0 - 99 mg/dL  43    45    ?HDL-C >39 mg/dL  41    36    ?Trlycerides 0 - 149 mg/dL  113    67    ?Hemoglobin A1c 4.8 - 5.6 % 6.7   6.6   6.5   6.6    ? ?  04/04/2021  ?Labs for ITP Cardiac and Pulmonary Rehab  ?Cholestrol 101    ?LDL (calc) 43    ?HDL-C 40    ?Trlycerides 96    ?Hemoglobin A1c 6.4    ?  ? ? Multiple values from one day are sorted in reverse-chronological order  ?  ?  ? ? ? ?Exercise Target Goals: ?Exercise Program Goal: ?Individual exercise prescription set using results from initial 6 min walk test and THRR while considering  patient?s activity barriers and safety.  ? ?Exercise Prescription Goal: ?Initial exercise prescription builds to 30-45 minutes a day of aerobic activity, 2-3 days per week.  Home exercise guidelines will be given to patient during program as part of exercise  prescription that the participant will acknowledge. ? ? ?Education: Aerobic Exercise: ?- Group verbal and visual presentation on the components of exercise prescription. Introduces F.I.T.T principle from ACSM for exercise prescriptions.  Reviews F.I.T.T. principles of aerobic exercise including progression. Written material given at graduation. ?Flowsheet Row Cardiac Rehab from 05/18/2021 in [facility] Cardiac and Pulmonary Rehab  ?Date 02/09/21  ?Educator AS  ?Instruction Review Code 1- Verbalizes Understanding  ? ?  ? ? ?Education: Resistance Exercise: ?- Group verbal and visual presentation on the components of exercise prescription. Introduces F.I.T.T principle from ACSM for exercise prescriptions  Reviews F.I.T.T. principles of resistance exercise including progression. Written material given at graduation. ?Flowsheet Row Cardiac Rehab from 05/18/2021 in [facility] Cardiac and Pulmonary Rehab  ?Date 02/16/21  ?Educator AS  ?Instruction Review Code 1- Verbalizes Understanding  ? ?  ? ?  ?Education: Exercise & Equipment Safety: ?- Individual verbal instruction and demonstration of equipment use and safety with use of the equipment. ?Flowsheet Row Cardiac Rehab from 05/18/2021 in [facility] Cardiac and Pulmonary Rehab  ?Education need identified 01/19/21  ?Date 01/19/21  ?Educator KL  ?Instruction Review Code 1- Verbalizes Understanding  ? ?  ? ? ?Education: Exercise Physiology & General Exercise Guidelines: ?- Group verbal and written instruction with models to review the exercise physiology of the cardiovascular system and associated critical values. Provides general exercise guidelines with specific guidelines to those with heart or lung disease.  ?Flowsheet Row Cardiac Rehab from 05/18/2021 in [facility] Cardiac and Pulmonary Rehab  ?Education need identified 01/19/21  ? ?  ? ? ?Education: Flexibility, Balance, Mind/Body Relaxation: ?- Group verbal and visual presentation with interactive activity on the components of exercise prescription.  Introduces F.I.T.T principle from ACSM for exercise prescriptions. Reviews F.I.T.T. principles of flexibility and balance exercise training including progression. Also discusses the mind body connection.  Reviews various relaxation techniques to help reduce and manage stress (i.e. Deep breathing, progressive muscle relaxation, and visualization). Balance handout provided to take home. Written material given at graduation. ?Flowsheet Row Cardiac Rehab from 05/18/2021 in [facility] Cardiac and Pulmonary Rehab  ?Date 05/04/21  ?Educator AS  ?Instruction Review Code 1- Verbalizes Understanding  ? ?  ? ? ?Activity Barriers & Risk Stratification: ? Activity Barriers & Cardiac Risk Stratification - 01/19/21 1245   ? ?  ?  Activity Barriers & Cardiac Risk Stratification  ? Activity Barriers Other (comment);Deconditioning;Back Problems;Joint Problems;Left Hip Replacement;Right Hip Replacement;Muscular Weakness;Assistive Device   ? Comments neuropathy; pain stimulator inserted April 2021 for back; 3 hip replacements, neuropathy in legs   ? Cardiac Risk Stratification High   ? ?  ?  ? ?  ? ? ?6 Minute Walk: ? 6 Minute Walk   ? ? [place] Name 01/19/21 1250 05/02/21 1443  ?  ?  ? 6 Minute Walk  ? Phase Initial Discharge   ? Distance 550 feet 600 feet   ? Distance % Change -- 9.09 %   ? Distance Feet Change -- 50 ft   ? Walk Time 6 minutes 6 minutes   ? # of Rest Breaks 0 0   ? MPH 1.04 1.13   ? METS 1.2 1.22   ? RPE 12 14   ? Perceived Dyspnea  1 1   ? VO2 Peak 4.21 4.27   ? Symptoms Yes (comment) Yes (comment)   ? Comments Fatigue, Back pain 4/10 Bilateral leg weakness   ? Resting HR 58 bpm 60 bpm   ? Resting BP 138/72 126/64   ? Resting Oxygen Saturation  99 % 97 %   ? Exercise Oxygen Saturation  during 6 min walk 98 % 98 %   ? Max Ex. HR 79 bpm 77 bpm   ? Max Ex. BP 146/70 134/64   ? 2 Minute Post BP 140/72 --   ? ?  ?  ? ?  ? ? ?Oxygen Initial Assessment: ? ? ?Oxygen Re-Evaluation: ? ? ?Oxygen Discharge (Final Oxygen  Re-Evaluation): ? ? ?Initial Exercise Prescription: ? Initial Exercise Prescription - 01/19/21 1200   ? ?  ? Date of Initial Exercise RX and Referring Provider  ? Date 01/19/21   ? Referring Provider [name] MD   ?  ? O

## 2021-05-18 NOTE — Assessment & Plan Note (Signed)
Awaiting sleep study report, would benefit from return to CPAP use. ?

## 2021-05-18 NOTE — Progress Notes (Signed)
? ?BP 115/71   Pulse 71   Temp 97.7 ?F (36.5 ?C) (Oral)   Ht 5' 11.5" (1.816 m)   Wt 187 lb (84.8 kg)   SpO2 98%   BMI 25.72 kg/m?   ? ?Subjective:  ? ? Patient ID: Sean Eastham., male    DOB: 1940-09-19, 81 y.o.   MRN: [id number] ? ?HPI: ?Sean Helmstetter. is a 81 y.o. male ? ?Chief Complaint  ?Patient presents with  ? Fatigue  ?  Patient states his weakness is getting worse and he is extremely weak. Patient states he is completely worn out when he walked from his truck inside to his appointment today.   ? Peripheral Neuropathy  ? Back Pain  ? ?FATIGUE/WEAKNESS ?Ongoing fatigue/weakness is present.  History of NSTEMI on 12/20/20, with left heart cath done on 12/22/20.  Not scheduled to see cardiology until 09/20/21.  Last echo on 01/11/21 with EF >55%. Getting SOB with walking from truck to office and office to truck today.  Denies any chest pain, edema, N&V, or abdominal distension.  No weight gain, has lost 4 pounds since beginning of March -- is not eating like he should.  Reports a lot of things going on in house.  Metoprolol recently decreased to 1/2 a tablet due to low HR in 40's.  ? ?He is not feeling himself, feels like a zombie most days -- neuropathy is just getting worse he reports.  Had adjustments to pain stimulator on 03/21/21. Has ongoing neuropathy issues -- difficulty with balance due to this.  Saw dermatology recently and they started him on a cream with Gabapentin in it for the area on his leg which causes discomfort.  Had initial visit with neurology on 04/28/21 for ongoing weakness/neuropathy with no medication changes made.  In past tried Gabapentin, but this offered no benefit. ? ?Followed by hematology for anemia, with last visit on 03/09/21.  He has completed IV iron.   ? ?Saw pulmonary for CPAP on 04/25/21, did use CPAP at one time.  Has not used CPAP in 2-3 years.  He did sleep study recently.   ? ?Does have issues with getting up to bathroom 2-3 times to void -- takes Flomax and Vesicare  nightly, goes to see  urology beginning of April.  Has had ongoing issues with bladder control since his pain stimulator placed.  In past has had to have Botox injections to bladder, which offered benefit.   ?Duration:  chronic ?Severity: 9/10  ?Onset: gradual ?Context when symptoms started:   NSTEMI ?Symptoms improve with rest: yes -- but 1 hour after waking gets brain fog ?Depressive symptoms: no ?Stress/anxiety: no ?Insomnia: yes hard to stay asleep ?Snoring: yes ?Observed apnea by bed partner: no ?Daytime hypersomnolence:yes ?Wakes feeling refreshed: no ?History of sleep study: yes ?Dysnea on exertion:  no ?Orthopnea/PND: no ?Chest pain: no ?Chronic cough: no ?Lower extremity edema: no ?Arthralgias: yes ?Myalgias: no ?Weakness: yes ?Rash: no  ? ?Relevant past medical, surgical, family and social history reviewed and updated as indicated. Interim medical history since our last visit reviewed. ?Allergies and medications reviewed and updated. ? ?Review of Systems  ?Constitutional:  Positive for fatigue. Negative for activity change, appetite change, diaphoresis and fever.  ?Respiratory:  Negative for cough, chest tightness, shortness of breath and wheezing.   ?Cardiovascular:  Negative for chest pain, palpitations and leg swelling.  ?Gastrointestinal: Negative.   ?Endocrine: Negative for cold intolerance, heat intolerance, polydipsia, polyphagia and polyuria.  ?Musculoskeletal:  Positive for arthralgias.  ?  Neurological: Negative.   ?Psychiatric/Behavioral: Negative.    ? ?Per HPI unless specifically indicated above ? ?   ?Objective:  ?  ?BP 115/71   Pulse 71   Temp 97.7 ?F (36.5 ?C) (Oral)   Ht 5' 11.5" (1.816 m)   Wt 187 lb (84.8 kg)   SpO2 98%   BMI 25.72 kg/m?   ?Wt Readings from Last 3 Encounters:  ?05/18/21 187 lb (84.8 kg)  ?05/02/21 187 lb 8 oz (85 kg)  ?04/25/21 191 lb (86.6 kg)  ?  ?Physical Exam ?Vitals and nursing note reviewed.  ?Constitutional:   ?   General: He is awake. He is not in acute  distress. ?   Appearance: He is well-developed and well-groomed. He is not ill-appearing.  ?HENT:  ?   Head: Normocephalic and atraumatic.  ?   Right Ear: Hearing normal. No drainage.  ?   Left Ear: Hearing normal. No drainage.  ?Eyes:  ?   General: Lids are normal.     ?   Right eye: No discharge.     ?   Left eye: No discharge.  ?   Conjunctiva/sclera: Conjunctivae normal.  ?   Pupils: Pupils are equal, round, and reactive to light.  ?Neck:  ?   Thyroid: No thyromegaly.  ?   Vascular: No carotid bruit.  ?   Trachea: Trachea normal.  ?Cardiovascular:  ?   Rate and Rhythm: Normal rate and regular rhythm.  ?   Pulses:     ?     Dorsalis pedis pulses are 2+ on the right side and 2+ on the left side.  ?     Posterior tibial pulses are 2+ on the right side and 2+ on the left side.  ?   Heart sounds: Normal heart sounds, S1 normal and S2 normal. No murmur heard. ?  No gallop.  ?Pulmonary:  ?   Effort: Pulmonary effort is normal. No accessory muscle usage or respiratory distress.  ?   Breath sounds: Normal breath sounds.  ?Abdominal:  ?   General: Bowel sounds are normal.  ?   Palpations: Abdomen is soft.  ?Musculoskeletal:     ?   General: Normal range of motion.  ?   Cervical back: Normal range of motion and neck supple.  ?   Right lower leg: No edema.  ?   Left lower leg: No edema.  ?   Right foot: Normal range of motion.  ?   Left foot: Normal range of motion.  ?Feet:  ?   Right foot:  ?   Protective Sensation: 10 sites tested.  5 sites sensed.  ?   Skin integrity: Skin integrity normal.  ?   Toenail Condition: Right toenails are normal.  ?   Left foot:  ?   Protective Sensation: 10 sites tested.  4 sites sensed.  ?   Skin integrity: Skin integrity normal.  ?   Toenail Condition: Left toenails are normal.  ?Lymphadenopathy:  ?   Cervical: No cervical adenopathy.  ?Skin: ?   General: Skin is warm and dry.  ?   Capillary Refill: Capillary refill takes less than 2 seconds.  ?Neurological:  ?   Mental Status: He is alert  and oriented to person, place, and time.  ?   Deep Tendon Reflexes:  ?   Reflex Scores: ?     Patellar reflexes are 1+ on the right side and 1+ on the left side. ?  Achilles reflexes are 1+ on the right side and 1+ on the left side. ?   Comments: Antalgic gait, using cane.  ?Psychiatric:     ?   Attention and Perception: Attention normal.     ?   Mood and Affect: Mood normal.     ?   Speech: Speech normal.     ?   Behavior: Behavior normal. Behavior is cooperative.     ?   Thought Content: Thought content normal.  ? ?Results for orders placed or performed in visit on 04/25/21  ?Microscopic Examination  ? Urine  ?Result Value Ref Range  ? WBC, UA None seen 0 - 5 /hpf  ? RBC None seen 0 - 2 /hpf  ? Epithelial Cells (non renal) None seen 0 - 10 /hpf  ? Mucus, UA Present (A) Not Estab.  ? Bacteria, UA None seen None seen/Few  ?CBC with Differential/Platelet  ?Result Value Ref Range  ? WBC 6.9 3.4 - 10.8 x10E3/uL  ? RBC 3.96 (L) 4.14 - 5.80 x10E6/uL  ? Hemoglobin 12.1 (L) 13.0 - 17.7 g/dL  ? Hematocrit 36.2 (L) 37.5 - 51.0 %  ? MCV 91 79 - 97 fL  ? MCH 30.6 26.6 - 33.0 pg  ? MCHC 33.4 31.5 - 35.7 g/dL  ? RDW 13.1 11.6 - 15.4 %  ? Platelets 166 150 - 450 x10E3/uL  ? Neutrophils 65 Not Estab. %  ? Lymphs 23 Not Estab. %  ? Monocytes 11 Not Estab. %  ? Eos 1 Not Estab. %  ? Basos 0 Not Estab. %  ? Neutrophils Absolute 4.4 1.4 - 7.0 x10E3/uL  ? Lymphocytes Absolute 1.6 0.7 - 3.1 x10E3/uL  ? Monocytes Absolute 0.8 0.1 - 0.9 x10E3/uL  ? EOS (ABSOLUTE) 0.1 0.0 - 0.4 x10E3/uL  ? Basophils Absolute 0.0 0.0 - 0.2 x10E3/uL  ? Immature Granulocytes 0 Not Estab. %  ? Immature Grans (Abs) 0.0 0.0 - 0.1 x10E3/uL  ?Comprehensive metabolic panel  ?Result Value Ref Range  ? Glucose 135 (H) 70 - 99 mg/dL  ? BUN 20 8 - 27 mg/dL  ? Creatinine, Ser 1.31 (H) 0.76 - 1.27 mg/dL  ? eGFR 56 (L) >59 mL/min/1.73  ? BUN/Creatinine Ratio 15 10 - 24  ? Sodium 140 134 - 144 mmol/L  ? Potassium 4.5 3.5 - 5.2 mmol/L  ? Chloride 101 96 - 106 mmol/L  ?  CO2 24 20 - 29 mmol/L  ? Calcium 9.6 8.6 - 10.2 mg/dL  ? Total Protein 6.5 6.0 - 8.5 g/dL  ? Albumin 4.0 3.7 - 4.7 g/dL  ? Globulin, Total 2.5 1.5 - 4.5 g/dL  ? Albumin/Globulin Ratio 1.6 1.2 - 2.2  ? Bilirubin Total 0.

## 2021-05-18 NOTE — Assessment & Plan Note (Signed)
On 12/20/20 -- at this time continue collaboration with cardiology and current medication regimen.  Recommend he schedule with them ASAP due to new onset SBOE. ?

## 2021-05-18 NOTE — Assessment & Plan Note (Signed)
Chronic, ongoing with A1c 6.4% in February 2023  and urine ALB 80.  Significant decreased sensation bilateral feet, monitor closely for wounds and falls.  Continue current diabetes medication regimen and adjust as needed.  Will trial Lyrica 25 MG to start and if tolerated adjust further.  Did not have benefit from Gabapentin in past.  Discussed at length with him and educated him on this, also discussed possible trial of Duloxetine with Lyrica in future.  Would benefit from return to Benazepril in future or alternate ACE or ARB for kidney protection with proteinuria.  Continue to monitor BS daily and document for visits. ?

## 2021-05-19 NOTE — Progress Notes (Signed)
Contacted via [place] ? ? ?Good afternoon Juwaun, your labs have returned and so far testosterone levels and heart failure lab (B[facility]) are all in normal range.  I do continue to recommend follow-up with cardiology.  Let me know how you do with Lyrica.  Any questions? ?Keep being amazing!!  Thank you for allowing me to participate in your care.  I appreciate you. ?Kindest regards, ?Caroljean Monsivais ?

## 2021-05-20 LAB — TESTOSTERONE, FREE, TOTAL, SHBG
Sex Hormone Binding: 49.3 nmol/L (ref 19.3–76.4)
Testosterone, Free: 5.7 pg/mL — ABNORMAL LOW (ref 6.6–18.1)
Testosterone: 351 ng/dL (ref 264–916)

## 2021-05-20 LAB — BRAIN NATRIURETIC PEPTIDE: BNP: 44.8 pg/mL (ref 0.0–100.0)

## 2021-05-20 NOTE — Progress Notes (Signed)
Contacted via [place] ? ? ?Your Free Testosterone was a little on low side, but total was normal.  We will monitor and may recheck first thing in morning next visit.

## 2021-05-25 DIAGNOSIS — R338 Other retention of urine: Secondary | ICD-10-CM | POA: Diagnosis not present

## 2021-05-25 DIAGNOSIS — R3914 Feeling of incomplete bladder emptying: Secondary | ICD-10-CM | POA: Diagnosis not present

## 2021-05-25 DIAGNOSIS — N401 Enlarged prostate with lower urinary tract symptoms: Secondary | ICD-10-CM | POA: Diagnosis not present

## 2021-05-25 DIAGNOSIS — N3281 Overactive bladder: Secondary | ICD-10-CM | POA: Diagnosis not present

## 2021-06-07 ENCOUNTER — Other Ambulatory Visit: Payer: Self-pay

## 2021-06-07 DIAGNOSIS — D649 Anemia, unspecified: Secondary | ICD-10-CM

## 2021-06-08 ENCOUNTER — Inpatient Hospital Stay: Payer: Medicare Other

## 2021-06-08 ENCOUNTER — Inpatient Hospital Stay: Payer: Medicare Other | Attending: Internal Medicine

## 2021-06-08 ENCOUNTER — Encounter: Payer: Self-pay | Admitting: Internal Medicine

## 2021-06-08 ENCOUNTER — Inpatient Hospital Stay (HOSPITAL_BASED_OUTPATIENT_CLINIC_OR_DEPARTMENT_OTHER): Payer: Medicare Other | Admitting: Internal Medicine

## 2021-06-08 DIAGNOSIS — D649 Anemia, unspecified: Secondary | ICD-10-CM | POA: Diagnosis not present

## 2021-06-08 DIAGNOSIS — D631 Anemia in chronic kidney disease: Secondary | ICD-10-CM | POA: Diagnosis not present

## 2021-06-08 DIAGNOSIS — N183 Chronic kidney disease, stage 3 unspecified: Secondary | ICD-10-CM | POA: Insufficient documentation

## 2021-06-08 DIAGNOSIS — D696 Thrombocytopenia, unspecified: Secondary | ICD-10-CM | POA: Insufficient documentation

## 2021-06-08 DIAGNOSIS — I2510 Atherosclerotic heart disease of native coronary artery without angina pectoris: Secondary | ICD-10-CM | POA: Diagnosis not present

## 2021-06-08 DIAGNOSIS — Z7902 Long term (current) use of antithrombotics/antiplatelets: Secondary | ICD-10-CM | POA: Diagnosis not present

## 2021-06-08 DIAGNOSIS — E611 Iron deficiency: Secondary | ICD-10-CM | POA: Insufficient documentation

## 2021-06-08 LAB — CBC WITH DIFFERENTIAL/PLATELET
Abs Immature Granulocytes: 0.01 10*3/uL (ref 0.00–0.07)
Basophils Absolute: 0 10*3/uL (ref 0.0–0.1)
Basophils Relative: 0 %
Eosinophils Absolute: 0.1 10*3/uL (ref 0.0–0.5)
Eosinophils Relative: 1 %
HCT: 33.6 % — ABNORMAL LOW (ref 39.0–52.0)
Hemoglobin: 11.4 g/dL — ABNORMAL LOW (ref 13.0–17.0)
Immature Granulocytes: 0 %
Lymphocytes Relative: 28 %
Lymphs Abs: 1.6 10*3/uL (ref 0.7–4.0)
MCH: 31.6 pg (ref 26.0–34.0)
MCHC: 33.9 g/dL (ref 30.0–36.0)
MCV: 93.1 fL (ref 80.0–100.0)
Monocytes Absolute: 0.7 10*3/uL (ref 0.1–1.0)
Monocytes Relative: 12 %
Neutro Abs: 3.3 10*3/uL (ref 1.7–7.7)
Neutrophils Relative %: 59 %
Platelets: 148 10*3/uL — ABNORMAL LOW (ref 150–400)
RBC: 3.61 MIL/uL — ABNORMAL LOW (ref 4.22–5.81)
RDW: 13.2 % (ref 11.5–15.5)
WBC: 5.7 10*3/uL (ref 4.0–10.5)
nRBC: 0 % (ref 0.0–0.2)

## 2021-06-08 LAB — COMPREHENSIVE METABOLIC PANEL
ALT: 22 U/L (ref 0–44)
AST: 24 U/L (ref 15–41)
Albumin: 3.7 g/dL (ref 3.5–5.0)
Alkaline Phosphatase: 54 U/L (ref 38–126)
Anion gap: 3 — ABNORMAL LOW (ref 5–15)
BUN: 24 mg/dL — ABNORMAL HIGH (ref 8–23)
CO2: 28 mmol/L (ref 22–32)
Calcium: 8.9 mg/dL (ref 8.9–10.3)
Chloride: 101 mmol/L (ref 98–111)
Creatinine, Ser: 1.43 mg/dL — ABNORMAL HIGH (ref 0.61–1.24)
GFR, Estimated: 50 mL/min — ABNORMAL LOW (ref >60)
Glucose, Bld: 117 mg/dL — ABNORMAL HIGH (ref 70–99)
Potassium: 4.7 mmol/L (ref 3.5–5.1)
Sodium: 132 mmol/L — ABNORMAL LOW (ref 135–145)
Total Bilirubin: 0.3 mg/dL (ref 0.3–1.2)
Total Protein: 7 g/dL (ref 6.5–8.1)

## 2021-06-08 LAB — FERRITIN: Ferritin: 130 ng/mL (ref 24–336)

## 2021-06-08 LAB — IRON AND TIBC
Iron: 74 ug/dL (ref 45–182)
Saturation Ratios: 22 % (ref 17.9–39.5)
TIBC: 333 ug/dL (ref 250–450)
UIBC: 259 ug/dL

## 2021-06-08 NOTE — Progress Notes (Signed)
Patient denies new problems/concerns today.   Does have have bilateral lower extremity neuropathy that is followed by Dr. Cari Caraway.  Having difficulty walking today and relates it to the neuropathy.  Energy level is declining.   ?

## 2021-06-08 NOTE — Assessment & Plan Note (Addendum)
Mild to moderate symptomatic anemia Hb 11-12; ferritin-90; iron sat- 18%.Sean Reyes CKD-III. S/p IV venofer weekly x4; January 2023 -Hb- 12.2; on slow release PO iron. March 2023- Iron sat- 22%. HOLD IV venofer.  ? ?# Thrombocytopenia mild 148 platelets-monitor for now. ? ?# CKD stage III [GFR 50s]- STABLE.  ? ?# CAD [sp stenting OCT 2022; Dr.Kowalski]; on Eliquis/antiplatelet therapy.STABLE.  ? ?# DISPOSITION:  ?# HOLD venofer- ?# in 4 months- MD; labs- cbc/cmp; iron studies/feritin-;possible venofer- Dr.B ? ?

## 2021-06-08 NOTE — Progress Notes (Signed)
[place] ?CONSULT NOTE ? ?Patient Care Team: ?[name], NP as PCP - General (Nurse Practitioner) ?[name], MD as PCP - Family Medicine (Family Medicine) ?Corey Skains, MD as Consulting Physician (Internal Medicine) ?Ralene Bathe, MD (Dermatology) ?Kary Kos, MD as Consulting Physician (Neurosurgery) ?Hooten, Laurice Record, MD (Orthopedic Surgery) ?Marlaine Hind, MD as Consulting Physician (Physical Medicine and Rehabilitation) ?Christy Sartorius, MD as Referring Physician (Urology) ?Vanita Ingles, RN as Case Manager (General Practice) ? ?CHIEF COMPLAINTS/PURPOSE OF CONSULTATION: ANEMIA ? ? ?HEMATOLOGY HISTORY: ?;  ?#Chronic anemia hemoglobin 10-11; October 22 -iron saturation 17% ferritin 90s./? CKD-S/p Venofer x4; Jan 2023- Hb 12.3;  EGD/Colonoscopy: Sep, 2022- Dr.Anna ? ?# OCT 2022-non-STEMI- [s/p stenting]aspirin Brilinta; paroxysmal A. fib on Eliquis [Dr. Paraschoes];CKD stage III; chronic back pain-s/p back surgeries s/p spinal cord-pain stimulator; diabetes on oral medication; 2022-C. difficile colitis-s/p oral vancomycin.  ? ?HISTORY OF PRESENTING ILLNESS: Ambulating cane/crutches; alone. ? ?Sean Reyes. 81 y.o.  male iron deficient anemia/?  CKD stage III is here for follow-up.  ? ?Patient currently on oral iron/slow release.  Tolerating well.  No constipation. ? ?Denies any blood in stools or black-colored stools.  ? ?Review of Systems  ?Constitutional:  Positive for malaise/fatigue. Negative for chills, diaphoresis, fever and weight loss.  ?HENT:  Negative for nosebleeds and sore throat.   ?Eyes:  Negative for double vision.  ?Respiratory:  Positive for shortness of breath. Negative for cough, hemoptysis, sputum production and wheezing.   ?Cardiovascular:  Negative for chest pain, palpitations, orthopnea and leg swelling.  ?Gastrointestinal:  Negative for abdominal pain, blood in stool, constipation, diarrhea, heartburn, melena, nausea and vomiting.   ?Genitourinary:  Negative for dysuria, frequency and urgency.  ?Musculoskeletal:  Positive for back pain and joint pain.  ?Skin: Negative.  Negative for itching and rash.  ?Neurological:  Negative for dizziness, tingling, focal weakness, weakness and headaches.  ?Endo/Heme/Allergies:  Does not bruise/bleed easily.  ?Psychiatric/Behavioral:  Negative for depression. The patient is not nervous/anxious and does not have insomnia.   ? ?MEDICAL HISTORY:  ?Past Medical History:  ?Diagnosis Date  ? Anemia   ? Anxiety   ? Arthritis   ? Arthritis of neck   ? Atrial fibrillation ([place])   ? Cataracts, bilateral   ? Complication of anesthesia   ? pt reports low BP's after surgery at [facility] and difficulty awakening  ? Depression   ? Diabetes ([place])   ? dx 6-8 yrs ago  ? Dysrhythmia   ? a-fib  ? GERD (gastroesophageal reflux disease)   ? [place] TAKES ALKA SELTZER  ? History of kidney stones   ? 10-15 yrs ago  ? 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 Hyperlipidemia   ? Hypertension   ? Myocardial infarction [facility]) 12/2020  ? Nocturia   ? S/P ablation of atrial fibrillation   ? Ablative therapy  ? Sleep apnea   ? CPAP   ? Spinal stenosis   ? Tachycardia, unspecified   ? ? ?SURGICAL HISTORY: ?Past Surgical History:  ?Procedure Laterality Date  ? ABLATION    ? ANTERIOR LAT LUMBAR FUSION N/A 06/27/2017  ? Procedure: Anterior Lateral Lumbar Interbody  Fusion - Lumbar Two-Lumbar Three - Lumbar Three-Lumbar Four, Posterior Lumbar Interbody Fusion Lumbar Four- Five;  Surgeon: Kary Kos, MD;  Location: [place];  Service: Neurosurgery;  Laterality: N/A;  Anterior Lateral Lumbar Interbody  ?Fusion - Lumbar Two-Lumbar Three - Lumbar Three-Lumbar Four, Posterior  Lumbar Interbody Fusion Lumbar Four- Five  ? BACK SURGERY    ? CARDIOVERSION N/A 08/29/2018  ? Procedure: CARDIOVERSION;  Surgeon: Corey Skains, MD;  Location: [facility] ORS;  Service: Cardiovascular;  Laterality: N/A;  ? CARDIOVERSION N/A 09/24/2018  ? Procedure:  CARDIOVERSION;  Surgeon: Corey Skains, MD;  Location: [facility] ORS;  Service: Cardiovascular;  Laterality: N/A;  ? COLONOSCOPY WITH PROPOFOL N/A 10/05/2015  ? Procedure: COLONOSCOPY WITH PROPOFOL;  Surgeon: Lollie Sails, MD;  Location: [facility] ENDOSCOPY;  Service: Endoscopy;  Laterality: N/A;  ? COLONOSCOPY WITH PROPOFOL N/A 11/01/2020  ? Procedure: COLONOSCOPY WITH PROPOFOL;  Surgeon: Jonathon Bellows, MD;  Location: [facility] ENDOSCOPY;  Service: Gastroenterology;  Laterality: N/A;  ? CORONARY STENT INTERVENTION N/A 12/22/2020  ? Procedure: CORONARY STENT INTERVENTION;  Surgeon: [name], MD;  Location: [place] CV LAB;  Service: Cardiovascular;  Laterality: N/A;  ? ESOPHAGOGASTRODUODENOSCOPY N/A 11/01/2020  ? Procedure: ESOPHAGOGASTRODUODENOSCOPY (EGD);  Surgeon: Jonathon Bellows, MD;  Location: [facility] ENDOSCOPY;  Service: Gastroenterology;  Laterality: N/A;  ? ESOPHAGOGASTRODUODENOSCOPY (EGD) WITH PROPOFOL N/A 04/01/2018  ? Procedure: ESOPHAGOGASTRODUODENOSCOPY (EGD) WITH PROPOFOL;  Surgeon: Lollie Sails, MD;  Location: [facility] ENDOSCOPY;  Service: Endoscopy;  Laterality: N/A;  ? HERNIA REPAIR    ? JOINT REPLACEMENT Bilateral   ? hips  RT+  LEFT X2   ? LEFT HEART CATH AND CORONARY ANGIOGRAPHY N/A 12/22/2020  ? Procedure: LEFT HEART CATH AND CORONARY ANGIOGRAPHY;  Surgeon: [name], MD;  Location: [place] CV LAB;  Service: Cardiovascular;  Laterality: N/A;  ? LUMBAR LAMINECTOMY/DECOMPRESSION MICRODISCECTOMY Left 09/13/2016  ? Procedure: Microdiscectomy - Lumbar two-three,  Lumbar three- - left;  Surgeon: Kary Kos, MD;  Location: [place];  Service: Neurosurgery;  Laterality: Left;  ? SPINAL CORD STIMULATOR INSERTION  07/08/2019  ? TONSILLECTOMY    ? ? ?SOCIAL HISTORY: ?Social History  ? ?Socioeconomic History  ? Marital status: Married  ?  Spouse name: Malachy Mood   ? Number of children: 2  ? Years of education: Not on file  ? Highest education level: High school graduate  ?Occupational History  ?  Occupation: retired   ?Tobacco Use  ? Smoking status: Never  ? Smokeless tobacco: Never  ?Vaping Use  ? Vaping Use: Never used  ?Substance and Sexual Activity  ? Alcohol use: No  ? Drug use: No  ? Sexual activity: Not on file  ?Other Topics Concern  ? Not on file  ?Social History Narrative  ? MarriedGets regular exercise.  ?   ? Lives in [place] with wife/ daughter. Never smoked; no alcohol. 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   ? ?Social Determinants of Health  ? ?Financial Resource Strain: Low Risk   ? Difficulty of Paying Living Expenses: Not hard at all  ?Food Insecurity: No Food Insecurity  ? Worried About Charity fundraiser in the Last Year: Never true  ? Ran Out of Food in the Last Year: Never true  ?Transportation Needs: No Transportation Needs  ? Lack of Transportation (Medical): No  ? Lack of Transportation (Non-Medical): No  ?Physical Activity: Inactive  ? Days of Exercise per Week: 0 days  ? Minutes of Exercise per Session: 0 min  ?Stress: No Stress Concern Present  ? Feeling of Stress : Not at all  ?Social Connections: Moderately Integrated  ? Frequency of Communication with Friends and Family: More than three times a week  ? Frequency of Social Gatherings with Friends and Family: More than three times  a week  ? Attends Religious Services: More than 4 times per year  ? Active Member of Clubs or Organizations: No  ? Attends Archivist Meetings: Never  ? Marital Status: Married  ?Intimate Partner Violence: Not At Risk  ? Fear of Current or Ex-Partner: No  ? Emotionally Abused: No  ? Physically Abused: No  ? Sexually Abused: No  ? ? ?FAMILY HISTORY: ?Family History  ?Problem Relation Age of Onset  ? Brain cancer Mother   ? Kidney disease Neg Hx   ? Prostate cancer Neg Hx   ? Kidney cancer Neg Hx   ? Bladder Cancer Neg Hx   ? ? ?ALLERGIES:  is allergic to levaquin [levofloxacin in d5w], shellfish allergy, amiodarone, and adhesive  [tape]. ? ?MEDICATIONS:  ?Current Outpatient Medications  ?Medication Sig Dispense Refill  ? acetaminophen (TYLENOL) 500 MG tablet Take 500 mg by mouth every 6 (six) hours as needed (pain).    ? clopidogrel (PLAVIX) 75 MG tablet Take 75 mg by mouth d

## 2021-06-10 DIAGNOSIS — M419 Scoliosis, unspecified: Secondary | ICD-10-CM | POA: Diagnosis not present

## 2021-06-10 DIAGNOSIS — Z9689 Presence of other specified functional implants: Secondary | ICD-10-CM | POA: Diagnosis not present

## 2021-06-10 DIAGNOSIS — R29898 Other symptoms and signs involving the musculoskeletal system: Secondary | ICD-10-CM | POA: Diagnosis not present

## 2021-06-10 DIAGNOSIS — M5136 Other intervertebral disc degeneration, lumbar region: Secondary | ICD-10-CM | POA: Diagnosis not present

## 2021-06-12 NOTE — Patient Instructions (Signed)
Neuropathic Pain Neuropathic pain is pain caused by damage to the nerves that are responsible for certain sensations in your body (sensory nerves). Neuropathic pain can make you more sensitive to pain. Even a minor sensation can feel very painful. This is usually a long-term (chronic) condition that can be difficult to treat. The type of pain differs from person to person. It may: Start suddenly (acute), or it may develop slowly and become chronic. Come and go as damaged nerves heal, or it may stay at the same level for years. Cause emotional distress, loss of sleep, and a lower quality of life. What are the causes? The most common cause of this condition is diabetes. Many other diseases and conditions can also cause neuropathic pain. Causes of neuropathic pain can be classified as: Toxic. This is caused by medicines and chemicals. The most common causes of toxic neuropathic pain is damage from medicines that kill cancer cells (chemotherapy) or alcohol abuse. Metabolic. This can be caused by: Diabetes. Lack of vitamins like B12. Traumatic. Any injury that cuts, crushes, or stretches a nerve can cause damage and pain. Compression-related. If a sensory nerve gets trapped or compressed for a long period of time, the blood supply to the nerve can be cut off. Vascular. Many blood vessel diseases can cause neuropathic pain by decreasing blood supply and oxygen to nerves. Autoimmune. This type of pain results from diseases in which the body's defense system (immune system) mistakenly attacks sensory nerves. Examples of autoimmune diseases that can cause neuropathic pain include lupus and multiple sclerosis. Infectious. Many types of viral infections can damage sensory nerves and cause pain. Shingles infection is a common cause of this type of pain. Inherited. Neuropathic pain can be a symptom of many diseases that are passed down through families (genetic). What increases the risk? You are more likely to  develop this condition if: You have diabetes. You smoke. You drink too much alcohol. You are taking certain medicines, including chemotherapy or medicines that treat immune system disorders. What are the signs or symptoms? The main symptom is pain. Neuropathic pain is often described as: Burning. Shock-like. Stinging. Hot or cold. Itching. How is this diagnosed? No single test can diagnose neuropathic pain. It is diagnosed based on: A physical exam and your symptoms. Your health care provider will ask you about your pain. You may be asked to use a pain scale to describe how bad your pain is. Tests. These may be done to see if you have a cause and location of any nerve damage. They include: Nerve conduction studies and electromyography to test how well nerve signals travel through your nerves and muscles (electrodiagnostic testing). Skin biopsy to evaluate for small fiber neuropathy. Imaging studies, such as: X-rays. CT scan. MRI. How is this treated? Treatment for neuropathic pain may change over time. You may need to try different treatment options or a combination of treatments. Some options include: Treating the underlying cause of the neuropathy, such as diabetes, kidney disease, or vitamin deficiencies. Stopping medicines that can cause neuropathy, such as chemotherapy. Medicine to relieve pain. Medicines may include: Prescription or over-the-counter pain medicine. Anti-seizure medicine. Antidepressant medicines. Pain-relieving patches or creams that are applied to painful areas of skin. A medicine to numb the area (local anesthetic), which can be injected as a nerve block. Transcutaneous nerve stimulation. This uses electrical currents to block painful nerve signals. The treatment is painless. Alternative treatments, such as: Acupuncture. Meditation. Massage. Occupational or physical therapy. Pain management programs. Counseling. Follow   these instructions at  home: Medicines  Take over-the-counter and prescription medicines only as told by your health care provider. Ask your health care provider if the medicine prescribed to you: Requires you to avoid driving or using machinery. Can cause constipation. You may need to take these actions to prevent or treat constipation: Drink enough fluid to keep your urine pale yellow. Take over-the-counter or prescription medicines. Eat foods that are high in fiber, such as beans, whole grains, and fresh fruits and vegetables. Limit foods that are high in fat and processed sugars, such as fried or sweet foods. Lifestyle  Have a good support system at home. Consider joining a chronic pain support group. Do not use any products that contain nicotine or tobacco. These products include cigarettes, chewing tobacco, and vaping devices, such as e-cigarettes. If you need help quitting, ask your health care provider. Do not drink alcohol. General instructions Learn as much as you can about your condition. Work closely with all your health care providers to find the treatment plan that works best for you. Ask your health care provider what activities are safe for you. Keep all follow-up visits. This is important. Contact a health care provider if: Your pain treatments are not working. You are having side effects from your medicines. You are struggling with tiredness (fatigue), mood changes, depression, or anxiety. Get help right away if: You have thoughts of hurting yourself. Get help right away if you feel like you may hurt yourself or others, or have thoughts about taking your own life. Go to your nearest emergency room or: Call 911. Call the National Suicide Prevention Lifeline at 1-800-273-8255 or 988. This is open 24 hours a day. Text the Crisis Text Line at 741741. Summary Neuropathic pain is pain caused by damage to the nerves that are responsible for certain sensations in your body (sensory  nerves). Neuropathic pain may come and go as damaged nerves heal, or it may stay at the same level for years. Neuropathic pain is usually a long-term condition that can be difficult to treat. Consider joining a chronic pain support group. This information is not intended to replace advice given to you by your health care provider. Make sure you discuss any questions you have with your health care provider. Document Revised: 09/27/2020 Document Reviewed: 09/27/2020 Elsevier Patient Education  2023 [employer]  

## 2021-06-13 ENCOUNTER — Other Ambulatory Visit: Payer: Self-pay | Admitting: Neurosurgery

## 2021-06-13 DIAGNOSIS — M419 Scoliosis, unspecified: Secondary | ICD-10-CM

## 2021-06-13 DIAGNOSIS — R29898 Other symptoms and signs involving the musculoskeletal system: Secondary | ICD-10-CM

## 2021-06-13 DIAGNOSIS — M5136 Other intervertebral disc degeneration, lumbar region: Secondary | ICD-10-CM

## 2021-06-13 DIAGNOSIS — M51369 Other intervertebral disc degeneration, lumbar region without mention of lumbar back pain or lower extremity pain: Secondary | ICD-10-CM

## 2021-06-15 DIAGNOSIS — I2510 Atherosclerotic heart disease of native coronary artery without angina pectoris: Secondary | ICD-10-CM | POA: Diagnosis not present

## 2021-06-15 DIAGNOSIS — G4733 Obstructive sleep apnea (adult) (pediatric): Secondary | ICD-10-CM | POA: Diagnosis not present

## 2021-06-15 DIAGNOSIS — R001 Bradycardia, unspecified: Secondary | ICD-10-CM | POA: Insufficient documentation

## 2021-06-15 DIAGNOSIS — I480 Paroxysmal atrial fibrillation: Secondary | ICD-10-CM | POA: Diagnosis not present

## 2021-06-15 DIAGNOSIS — N1831 Chronic kidney disease, stage 3a: Secondary | ICD-10-CM | POA: Diagnosis not present

## 2021-06-15 DIAGNOSIS — I10 Essential (primary) hypertension: Secondary | ICD-10-CM | POA: Diagnosis not present

## 2021-06-15 DIAGNOSIS — E782 Mixed hyperlipidemia: Secondary | ICD-10-CM | POA: Diagnosis not present

## 2021-06-17 ENCOUNTER — Ambulatory Visit (INDEPENDENT_AMBULATORY_CARE_PROVIDER_SITE_OTHER): Payer: Medicare Other | Admitting: Nurse Practitioner

## 2021-06-17 ENCOUNTER — Encounter: Payer: Self-pay | Admitting: Nurse Practitioner

## 2021-06-17 VITALS — BP 117/65 | HR 52 | Temp 97.8°F | Ht 71.5 in | Wt 187.6 lb

## 2021-06-17 DIAGNOSIS — E1129 Type 2 diabetes mellitus with other diabetic kidney complication: Secondary | ICD-10-CM

## 2021-06-17 DIAGNOSIS — E785 Hyperlipidemia, unspecified: Secondary | ICD-10-CM

## 2021-06-17 DIAGNOSIS — G894 Chronic pain syndrome: Secondary | ICD-10-CM | POA: Diagnosis not present

## 2021-06-17 DIAGNOSIS — R809 Proteinuria, unspecified: Secondary | ICD-10-CM | POA: Diagnosis not present

## 2021-06-17 DIAGNOSIS — M961 Postlaminectomy syndrome, not elsewhere classified: Secondary | ICD-10-CM

## 2021-06-17 DIAGNOSIS — I480 Paroxysmal atrial fibrillation: Secondary | ICD-10-CM

## 2021-06-17 DIAGNOSIS — R5383 Other fatigue: Secondary | ICD-10-CM | POA: Diagnosis not present

## 2021-06-17 DIAGNOSIS — I25110 Atherosclerotic heart disease of native coronary artery with unstable angina pectoris: Secondary | ICD-10-CM

## 2021-06-17 DIAGNOSIS — I739 Peripheral vascular disease, unspecified: Secondary | ICD-10-CM | POA: Diagnosis not present

## 2021-06-17 DIAGNOSIS — E1169 Type 2 diabetes mellitus with other specified complication: Secondary | ICD-10-CM

## 2021-06-17 DIAGNOSIS — N400 Enlarged prostate without lower urinary tract symptoms: Secondary | ICD-10-CM

## 2021-06-17 DIAGNOSIS — E1159 Type 2 diabetes mellitus with other circulatory complications: Secondary | ICD-10-CM | POA: Diagnosis not present

## 2021-06-17 DIAGNOSIS — E1143 Type 2 diabetes mellitus with diabetic autonomic (poly)neuropathy: Secondary | ICD-10-CM | POA: Diagnosis not present

## 2021-06-17 DIAGNOSIS — D649 Anemia, unspecified: Secondary | ICD-10-CM

## 2021-06-17 DIAGNOSIS — D6869 Other thrombophilia: Secondary | ICD-10-CM

## 2021-06-17 DIAGNOSIS — R001 Bradycardia, unspecified: Secondary | ICD-10-CM | POA: Diagnosis not present

## 2021-06-17 DIAGNOSIS — I152 Hypertension secondary to endocrine disorders: Secondary | ICD-10-CM

## 2021-06-17 DIAGNOSIS — N1831 Chronic kidney disease, stage 3a: Secondary | ICD-10-CM | POA: Diagnosis not present

## 2021-06-17 LAB — BAYER DCA HB A1C WAIVED: HB A1C (BAYER DCA - WAIVED): 6.3 % — ABNORMAL HIGH (ref 4.8–5.6)

## 2021-06-17 NOTE — Assessment & Plan Note (Signed)
Ongoing, continue collaboration with cardiology and current medication regimen. 

## 2021-06-17 NOTE — Assessment & Plan Note (Addendum)
Chronic, stable with BP at goal.  A1c today 6.3%.  Continue Metformin and current cardiac medications -- may restart Benazepril in future if able, would benefit the proteinuria of 80 on labs February 2023.  Recommend he continue to check BP at home at least 3 mornings a week and document.  DASH diet.  Return in 3 months. ?

## 2021-06-17 NOTE — Assessment & Plan Note (Signed)
Chronic, ongoing.  Continue to monitor closely and refer to nephrology if decline.  Consider restart of Benazepril for kidney protection if levels on labs today are returned to baseline.  Renal dose medications as needed based on labs.  BMP today. ?

## 2021-06-17 NOTE — Assessment & Plan Note (Signed)
Patient on Eliquis with A-fib, monitor CBC regularly. 

## 2021-06-17 NOTE — Assessment & Plan Note (Signed)
Chronic, ongoing and followed by urology.  Continue this collaboration and current regimen as ordered by them. ?

## 2021-06-17 NOTE — Assessment & Plan Note (Signed)
To bilateral lower extremity.  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  Continue ASA daily and monitor closely for any wounds.  Return to vascular as needed. ?

## 2021-06-17 NOTE — Assessment & Plan Note (Addendum)
Chronic, ongoing.  Followed by cardiology, will continue this collaboration.  He is schedule to see them Monday, called today to see if could be seen sooner due to his symptoms of increased palpitations, they are unable to fit in today.  Will not restart Metoprolol as still brady, but will maintain Eliquis.  Per PCP and cardiology he is to go to ER if worsening symptoms over weekend. ?

## 2021-06-17 NOTE — Assessment & Plan Note (Signed)
Chronic, ongoing.  Followed by neurosurgery and pain management as needed.  Continue this collaboration and current regimen. 

## 2021-06-17 NOTE — Assessment & Plan Note (Signed)
Chronic, ongoing with A1c 6.3% today and urine ALB 80 (February 2023).  Significant decreased sensation bilateral feet, monitor closely for wounds and falls.  Continue current diabetes medication regimen and adjust as needed.  Will continue Lyrica 25 MG and if tolerated adjust further, at this time maintain dose.  Did not have benefit from Gabapentin in past.  Discussed at length with him and educated him on this, also discussed possible trial of Duloxetine with Lyrica in future.  Would benefit from return to Benazepril in future or alternate ACE or ARB for kidney protection with proteinuria.  Continue to monitor BS daily and document for visits. ?

## 2021-06-17 NOTE — Assessment & Plan Note (Signed)
Chronic, stable.  Continue collaboration with hematology, recent notes and labs reviewed. ?

## 2021-06-17 NOTE — Progress Notes (Signed)
? ?BP 117/65   Pulse (!) 52   Temp 97.8 ?F (36.6 ?C) (Oral)   Ht 5' 11.5" (1.816 m)   Wt 187 lb 9.6 oz (85.1 kg)   SpO2 98%   BMI 25.80 kg/m?   ? ?Subjective:  ? ? Patient ID: Sean Reyes., male    DOB: 09-Mar-1940, 81 y.o.   MRN: [id number] ? ?HPI: ?Sean Reyes. is a 81 y.o. male ? ?Chief Complaint  ?Patient presents with  ? Diabetes  ? Hyperlipidemia  ? Hypertension  ? Peripheral Neuropathy  ? Atrial Fibrillation  ?  Patient was recently seen for slower heart rate and was recently seen by Cardiologist and was taken off of his Metoprolol medication. Patient states since the change he has noticed he is going to a-fib or feels as if he is giving off. Patient states return to his Cardiologist Monday to have his heart monitor placed.   ? Fatigue  ?  Patient states he has been feeling extremely weak and says it has been hard for him to walk lately due to also having ongoing issues with his back.   ? ?DIABETES WITH NEUROPATHY ?A1c 7.1% March.  Continues Metformin 500 MG BID. ? ?Had adjustments to pain stimulator on 03/21/21. Has ongoing neuropathy issues -- difficulty with balance due to this.  Saw  neurosurgery on 06/10/21 for ongoing weakness/neuropathy, started Lyrica by PCP last visit at 25 MG and is tolerating well but has noted no neuropathy changes.  In past tried Gabapentin, but this offered no benefit. ?Hypoglycemic episodes:no ?Polydipsia/polyuria: no ?Visual disturbance: no ?Chest pain: no ?Paresthesias: no ?Glucose Monitoring: yes ? Accucheck frequency: Daily ? Fasting glucose: 117 to 124 ? Post prandial: ? Evening: ? Before meals: ?Taking Insulin?: no ? Long acting insulin: ? Short acting insulin: ?Blood Pressure Monitoring: daily ?Retinal Examination: Not up to Date -- Dr. Atilano Median in [place] ?Foot Exam: Up to Date ?Pneumovax: Up to Date ?Influenza: Up to Date ?Aspirin: no  ?  ?HYPERTENSION / HYPERLIPIDEMIA/A-FIB ?Sees Dr. Nehemiah Massed for cardiology.  Last seen 06/15/21 -- they have taken him off Metoprolol  due to bradycardia and now he is noticing his heart is beating harder and more palpitations.  Goes to cardiology on Monday for heart monitor placement.   ? 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  Is to take Plavix and Eliquis, ASA stopped in November.   ?  ?Taking Imdur and Propafenone. Taking Rosuvastatin 5 MG.  ?Satisfied with current treatment? yes ?Duration of hypertension: chronic ?BP monitoring frequency: a few times a week ?BP range: 110-130/80's ?BP medication side effects: no ?Duration of hyperlipidemia: chronic ?Cholesterol medication side effects: no ?Cholesterol supplements: none ?Medication compliance: good compliance ?Aspirin: no ?Recent stressors: no ?Recurrent headaches: no ?Visual changes: no ?Palpitations: yes ?Dyspnea: no ?Chest pain: no ?Lower extremity edema: no ?Dizzy/lightheaded: occasional ? ?CHRONIC KIDNEY DISEASE ?He is seeing urology and last saw 05/25/21 -- continues Tamsulosin and Vesicare. ?CKD status: stable ?Medications renally dose: yes ?Previous renal evaluation: no ?Pneumovax:  Up to Date ?Influenza Vaccine:  Up to Date  ?Lab Results  ?Component Value Date  ? CREATININE 1.43 (H) 06/08/2021  ? CREATININE 1.31 (H) 04/25/2021  ? CREATININE 1.48 (H) 04/04/2021  ?  ?ANEMIA ?Followed with hematology and last saw 06/08/21.  They did 4 iron infusions in past and since then has stayed on oral iron ?Anemia status: stable ?Etiology of anemia: post surgical ?Duration of  anemia treatment:  ?Compliance with treatment: good compliance ?Iron supplementation side effects: no ?Severity of anemia: mild ?Fatigue: none -- improved -- however recent testosterone free mildly low recent check ?Decreased exercise tolerance: no  ?Dyspnea on exertion: no ?Palpitations: no ?Bleeding: no ?Pica: no  ?  ?Relevant past medical, surgical, family and social history reviewed and updated as indicated. Interim medical history since our last visit  reviewed. ?Allergies and medications reviewed and updated. ? ?Review of Systems  ?Constitutional:  Negative for activity change, appetite change, diaphoresis, fatigue and fever.  ?Respiratory:  Negative for cough, chest tightness, shortness of breath and wheezing.   ?Cardiovascular:  Negative for chest pain, palpitations and leg swelling.  ?Gastrointestinal: Negative.   ?Endocrine: Negative for cold intolerance, heat intolerance, polydipsia, polyphagia and polyuria.  ?Neurological: Negative.   ?Psychiatric/Behavioral: Negative.    ? ?Per HPI unless specifically indicated above ? ?   ?Objective:  ?  ?BP 117/65   Pulse (!) 52   Temp 97.8 ?F (36.6 ?C) (Oral)   Ht 5' 11.5" (1.816 m)   Wt 187 lb 9.6 oz (85.1 kg)   SpO2 98%   BMI 25.80 kg/m?   ?Wt Readings from Last 3 Encounters:  ?06/17/21 187 lb 9.6 oz (85.1 kg)  ?06/08/21 187 lb (84.8 kg)  ?05/18/21 187 lb (84.8 kg)  ?  ?Physical Exam ?Vitals and nursing note reviewed.  ?Constitutional:   ?   General: He is awake. He is not in acute distress. ?   Appearance: He is well-developed and well-groomed. He is not ill-appearing.  ?HENT:  ?   Head: Normocephalic and atraumatic.  ?   Right Ear: Hearing normal. No drainage.  ?   Left Ear: Hearing normal. No drainage.  ?Eyes:  ?   General: Lids are normal.     ?   Right eye: No discharge.     ?   Left eye: No discharge.  ?   Conjunctiva/sclera: Conjunctivae normal.  ?   Pupils: Pupils are equal, round, and reactive to light.  ?Neck:  ?   Thyroid: No thyromegaly.  ?   Vascular: No carotid bruit.  ?   Trachea: Trachea normal.  ?Cardiovascular:  ?   Rate and Rhythm: Bradycardia present. Rhythm irregularly irregular.  ?   Heart sounds: Normal heart sounds, S1 normal and S2 normal. No murmur heard. ?  No gallop.  ?Pulmonary:  ?   Effort: Pulmonary effort is normal. No accessory muscle usage or respiratory distress.  ?   Breath sounds: Normal breath sounds.  ?Abdominal:  ?   General: Bowel sounds are normal.  ?   Palpations:  Abdomen is soft.  ?Musculoskeletal:     ?   General: Normal range of motion.  ?   Cervical back: Normal range of motion and neck supple.  ?   Right lower leg: No edema.  ?   Left lower leg: No edema.  ?Lymphadenopathy:  ?   Cervical: No cervical adenopathy.  ?Skin: ?   General: Skin is warm and dry.  ?   Capillary Refill: Capillary refill takes less than 2 seconds.  ?Neurological:  ?   Mental Status: He is alert and oriented to person, place, and time.  ?   Deep Tendon Reflexes:  ?   Reflex Scores: ?     Patellar reflexes are 1+ on the right side and 1+ on the left side. ?     Achilles reflexes are 1+ on the right side and 1+ on  the left side. ?   Comments: Antalgic gait, using cane.  ?Psychiatric:     ?   Attention and Perception: Attention normal.     ?   Mood and Affect: Mood normal.     ?   Speech: Speech normal.     ?   Behavior: Behavior normal. Behavior is cooperative.     ?   Thought Content: Thought content normal.  ? ?Results for orders placed or performed in visit on 06/08/21  ?Ferritin  ?Result Value Ref Range  ? Ferritin 130 24 - 336 ng/mL  ?Iron and TIBC  ?Result Value Ref Range  ? Iron 74 45 - 182 ug/dL  ? TIBC 333 250 - 450 ug/dL  ? Saturation Ratios 22 17.9 - 39.5 %  ? UIBC 259 ug/dL  ?Comprehensive metabolic panel  ?Result Value Ref Range  ? Sodium 132 (L) 135 - 145 mmol/L  ? Potassium 4.7 3.5 - 5.1 mmol/L  ? Chloride 101 98 - 111 mmol/L  ? CO2 28 22 - 32 mmol/L  ? Glucose, Bld 117 (H) 70 - 99 mg/dL  ? BUN 24 (H) 8 - 23 mg/dL  ? Creatinine, Ser 1.43 (H) 0.61 - 1.24 mg/dL  ? Calcium 8.9 8.9 - 10.3 mg/dL  ? Total Protein 7.0 6.5 - 8.1 g/dL  ? Albumin 3.7 3.5 - 5.0 g/dL  ? AST 24 15 - 41 U/L  ? ALT 22 0 - 44 U/L  ? Alkaline Phosphatase 54 38 - 126 U/L  ? Total Bilirubin 0.3 0.3 - 1.2 mg/dL  ? GFR, Estimated 50 (L) >60 mL/min  ? Anion gap 3 (L) 5 - 15  ?CBC with Differential/Platelet  ?Result Value Ref Range  ? WBC 5.7 4.0 - 10.5 K/uL  ? RBC 3.61 (L) 4.22 - 5.81 MIL/uL  ? Hemoglobin 11.4 (L) 13.0 -  17.0 g/dL  ? HCT 33.6 (L) 39.0 - 52.0 %  ? MCV 93.1 80.0 - 100.0 fL  ? MCH 31.6 26.0 - 34.0 pg  ? MCHC 33.9 30.0 - 36.0 g/dL  ? RDW 13.2 11.5 - 15.5 %  ? Platelets 148 (L) 150 - 400 K/uL  ? nRBC 0.0 0.0 - 0.2 %  ? Neutrophils

## 2021-06-17 NOTE — Assessment & Plan Note (Signed)
Chronic, ongoing with A1c 6.3% today and urine ALB 80 (February 2023).  Decreased sensation bilateral feet, monitor closely for wounds and falls.  Continue current medication regimen and adjust as needed.  Would benefit from return to Benazepril in future or alternate ACE or ARB for kidney protection with proteinuria.  Continue to monitor BS daily and document for visits. ?

## 2021-06-17 NOTE — Assessment & Plan Note (Signed)
Ongoing issue with worsening post STEMI.  Labs today Testosterone.  Suspect multifactorial with main issue being need for new CPAP and neuropathy. Discussed at length with him today. ?

## 2021-06-17 NOTE — Assessment & Plan Note (Signed)
Followed by cardiology, will maintain off Metoprolol at this time. ?

## 2021-06-17 NOTE — Assessment & Plan Note (Signed)
Chronic, ongoing.  Continue current medication regimen, tolerating 5 MG Crestor well without ADR, and adjust as needed.  Lipid panel up to date. 

## 2021-06-19 IMAGING — CR DG ABD PORTABLE 2V
1 series · 3 of 3 positions shown · non-contrast
Comparison: None.

CLINICAL DATA: Constipation

EXAM:
PORTABLE ABDOMEN - 2 VIEW

[Series 1: view not recorded · 0.14mm/px · 3 of 3 slices shown]
[im 1/3]
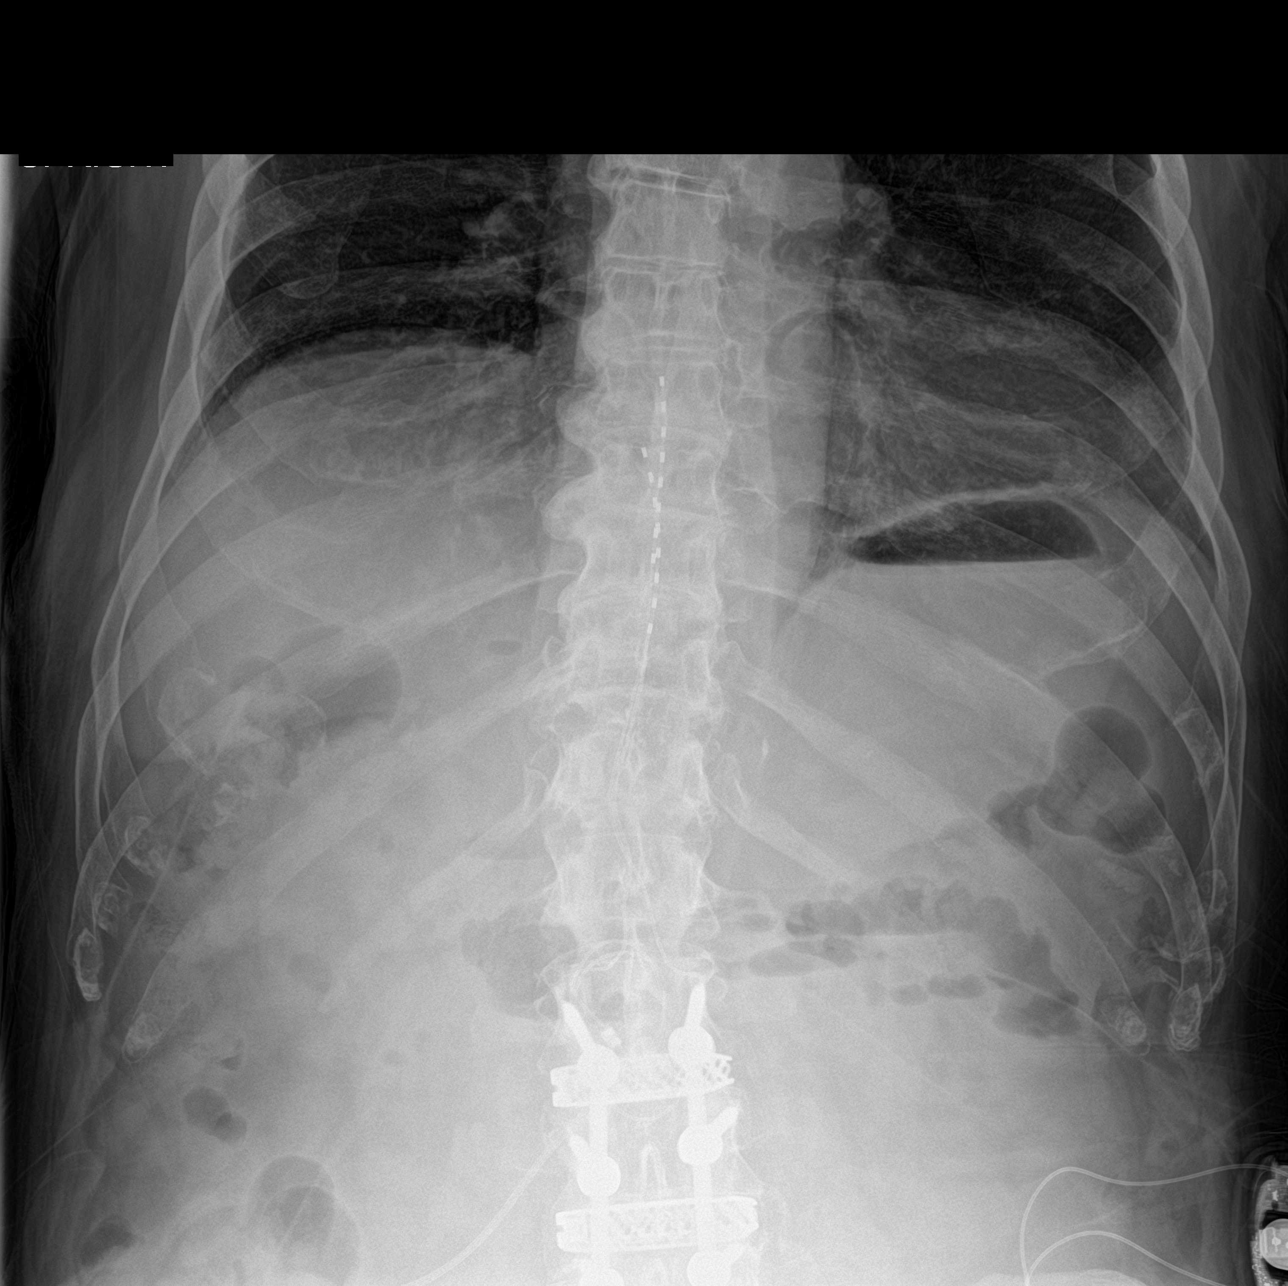
[im 2/3]
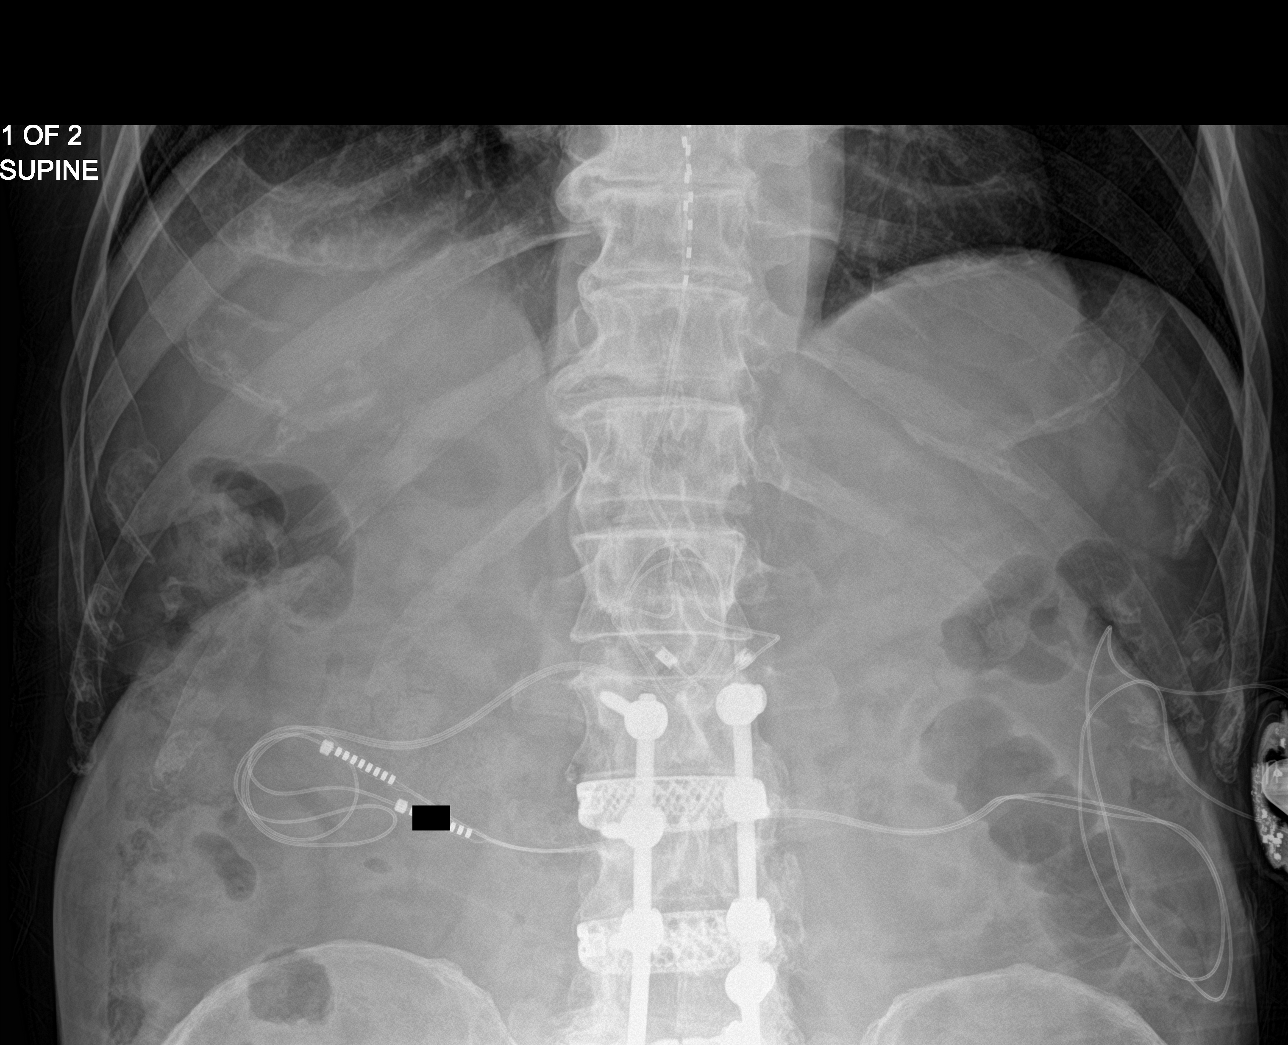
[im 3/3]
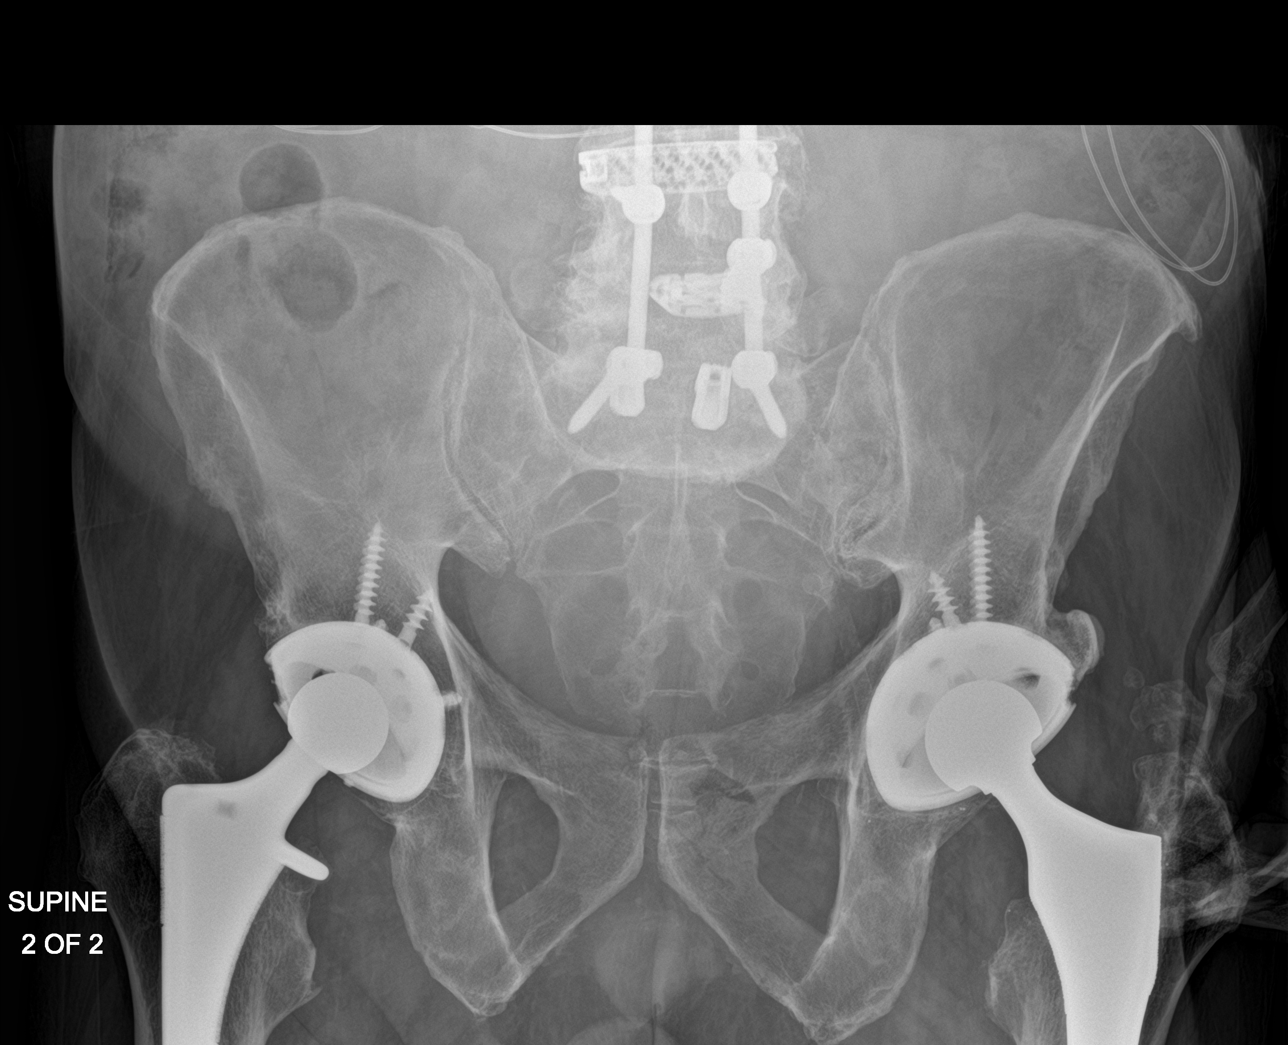

[3 of 3 positions shown; findings below may reference images not displayed]

FINDINGS: Supine and upright images obtained. There is moderate stool
throughout the colon. There is no bowel dilatation or air-fluid
levels to suggest bowel obstruction. No free air.

Lung bases are clear.  No abnormal calcifications.

There is postoperative change throughout much of the lumbar spine
and upper sacrum. There are total hip replacements bilaterally.
Thoracic stimulator lead tips are at the T8 level.
IMPRESSION: Moderate stool in colon. No bowel obstruction or free air evident.
Lung bases clear. Multiple foci of postoperative change as noted.

## 2021-06-19 NOTE — Progress Notes (Signed)
Contacted via [facility] ? ? ?Good afternoon Roald, your labs with exception of [employer], have returned.  You continue to show some mild kidney disease, creatinine and eGFR levels, but this is not worsening and remains stable.  Glucose has trended up a little, monitor diet closely at home.  Total testosterone level is normal and has trended up since last check.  Any questions? ?Keep being amazing!!  Thank you for allowing me to participate in your care.  I appreciate you. ?Kindest regards, ?Adria Costley ?

## 2021-06-20 ENCOUNTER — Other Ambulatory Visit: Payer: Self-pay

## 2021-06-20 ENCOUNTER — Emergency Department
Admission: EM | Admit: 2021-06-20 | Discharge: 2021-06-20 | Disposition: A | Discharge status: Home / Self Care | Payer: Medicare Other | Attending: Emergency Medicine | Admitting: Emergency Medicine

## 2021-06-20 ENCOUNTER — Emergency Department: Payer: Medicare Other

## 2021-06-20 DIAGNOSIS — I10 Essential (primary) hypertension: Secondary | ICD-10-CM | POA: Insufficient documentation

## 2021-06-20 DIAGNOSIS — Z7901 Long term (current) use of anticoagulants: Secondary | ICD-10-CM | POA: Diagnosis not present

## 2021-06-20 DIAGNOSIS — E119 Type 2 diabetes mellitus without complications: Secondary | ICD-10-CM | POA: Insufficient documentation

## 2021-06-20 DIAGNOSIS — R000 Tachycardia, unspecified: Secondary | ICD-10-CM | POA: Diagnosis not present

## 2021-06-20 DIAGNOSIS — R42 Dizziness and giddiness: Secondary | ICD-10-CM | POA: Diagnosis not present

## 2021-06-20 DIAGNOSIS — I480 Paroxysmal atrial fibrillation: Secondary | ICD-10-CM | POA: Diagnosis not present

## 2021-06-20 DIAGNOSIS — R55 Syncope and collapse: Secondary | ICD-10-CM | POA: Insufficient documentation

## 2021-06-20 DIAGNOSIS — R079 Chest pain, unspecified: Secondary | ICD-10-CM | POA: Diagnosis not present

## 2021-06-20 LAB — CBC
HCT: 34.6 % — ABNORMAL LOW (ref 39.0–52.0)
Hemoglobin: 11.3 g/dL — ABNORMAL LOW (ref 13.0–17.0)
MCH: 30.5 pg (ref 26.0–34.0)
MCHC: 32.7 g/dL (ref 30.0–36.0)
MCV: 93.5 fL (ref 80.0–100.0)
Platelets: 147 10*3/uL — ABNORMAL LOW (ref 150–400)
RBC: 3.7 MIL/uL — ABNORMAL LOW (ref 4.22–5.81)
RDW: 13.7 % (ref 11.5–15.5)
WBC: 6.3 10*3/uL (ref 4.0–10.5)
nRBC: 0 % (ref 0.0–0.2)

## 2021-06-20 LAB — BASIC METABOLIC PANEL
Anion gap: 6 (ref 5–15)
BUN: 21 mg/dL (ref 8–23)
CO2: 26 mmol/L (ref 22–32)
Calcium: 9.7 mg/dL (ref 8.9–10.3)
Chloride: 103 mmol/L (ref 98–111)
Creatinine, Ser: 1.26 mg/dL — ABNORMAL HIGH (ref 0.61–1.24)
GFR, Estimated: 58 mL/min — ABNORMAL LOW (ref >60)
Glucose, Bld: 245 mg/dL — ABNORMAL HIGH (ref 70–99)
Potassium: 4.4 mmol/L (ref 3.5–5.1)
Sodium: 135 mmol/L (ref 135–145)

## 2021-06-20 LAB — TROPONIN I (HIGH SENSITIVITY): Troponin I (High Sensitivity): 8 ng/L (ref <18)

## 2021-06-20 NOTE — ED Provider Notes (Signed)
? ?[facility] ?Provider Note ? ? ? Event Date/Time  ? First MD Initiated Contact with Patient 06/20/21 1329   ?  (approximate) ? ?History  ? ?Chief Complaint: Near Syncope ? ?HPI ? ?Sean Reyes. is a 81 y.o. male with a past medical history of anemia, paroxysmal atrial fibrillation, diabetes, hypertension, hyperlipidemia, presents to the emergency department for rapid heart rate.  According to the patient he has been recently experiencing episodes of bradycardia as low as 40 bpm.  Patient was on metoprolol 25 mg daily, Dr. Nehemiah Massed of the patient's cardiologist had cut this in half to 12.5 but the patient continued to have episodes of bradycardia at times per patient so Dr. Hessie Dibble told him to discontinue this medication altogether.  Patient is no longer taking metoprolol.  Now he states at random times his heart rate will be as high as 110 or 120 bpm.  He has a history of paroxysmal atrial fibrillation he is not sure if the elevated heart rate is A-fib or not.  Patient denies any chest pain at any point.  States at times when his heart rate is very high he will become somewhat short of breath. ? ?Physical Exam  ? ?Triage Vital Signs: ?ED Triage Vitals  ?Enc Vitals Group  ?   BP 06/20/21 1123 (!) 151/83  ?   Pulse Rate 06/20/21 1123 77  ?   Resp 06/20/21 1123 20  ?   Temp 06/20/21 1123 98.3 ?F (36.8 ?C)  ?   Temp Source 06/20/21 1123 Oral  ?   SpO2 06/20/21 1123 99 %  ?   Weight 06/20/21 1124 187 lb (84.8 kg)  ?   Height 06/20/21 1124 '6\' 1"'$  (1.854 m)  ?   Head Circumference --   ?   Peak Flow --   ?   Pain Score 06/20/21 1130 0  ?   Pain Loc --   ?   Pain Edu? --   ?   Excl. in [place]? --   ? ? ?Most recent vital signs: ?Vitals:  ? 06/20/21 1123 06/20/21 1332  ?BP: (!) 151/83 (!) 187/84  ?Pulse: 77 69  ?Resp: 20 20  ?Temp: 98.3 ?F (36.8 ?C)   ?SpO2: 99% 100%  ? ? ?General: Awake, no distress.  ?CV:  Good peripheral perfusion.  Regular rate and rhythm  ?Resp:  Normal effort.  Equal breath sounds  bilaterally.  ?Abd:  No distention.  Soft, nontender.  No rebound or guarding. ? ? ? ?ED Results / Procedures / Treatments  ? ?EKG ? ?EKG viewed and interpreted by myself shows a sinus rhythm at 75 bpm with a narrow QRS, normal axis, slight PR prolongation otherwise normal intervals with no concerning ST changes. ? ?RADIOLOGY ? ?I personally reviewed the chest x-ray images, no concerning findings on my evaluation. ?Chest x-ray read as negative by radiology. ? ?MEDICATIONS ORDERED IN ED: ?Medications - No data to display ? ? ?IMPRESSION / MDM / ASSESSMENT AND PLAN / ED COURSE  ?I reviewed the triage vital signs and the nursing notes. ? ?Patient presents to the emergency department with episodes of tachycardia.  According to the patient has been experiencing episodes of tachycardia greater than 100 bpm at times.  Patient has a history of paroxysmal atrial fibrillation and is not sure if this is A-fib related or not.  Patient is on Eliquis.  Previously was on metoprolol but has been taken off of this medication recently due to episodes of bradycardia.  Patient states at times his heart rate will now increase to 110 to 120 bpm.  Patient denies any chest pain.  Patient's work-up in the emergency department is reassuring.  Patient appears to be in sinus rhythm around 70 bpm.  Patient's lab work is reassuring including normal CBC, reassuring chemistry and a negative troponin.  We will discuss with the patient's cardiology group for further recommendations.  Patient agreeable to plan of care. ? ?I spoke with Dr. Nehemiah Massed, he recommends keeping the patient off of beta-blocker and having him come directly to the office this afternoon and they will place a Holter monitor on the patient for ongoing management.  I spoke to the patient regarding this plan of care he is agreeable and will go directly to Dr. Alveria Apley office after leaving.  ? ?FINAL CLINICAL IMPRESSION(S) / ED DIAGNOSES  ? ?Tachycardia ? ?Note:  This document was  prepared using Dragon voice recognition software and may include unintentional dictation errors. ?  ?Harvest Dark, MD ?06/20/21 1457 ? ?

## 2021-06-20 NOTE — Discharge Instructions (Addendum)
Please proceed directly to Dr. Alveria Apley office for Holter monitor placement.  Return to the emergency department for any shortness of breath, chest pain, or any other symptom personally concerning to yourself. ?

## 2021-06-20 NOTE — ED Triage Notes (Signed)
Pt states was taken off metoprolol last Thursday because his HR was dropping ot the 40's, states on Saturday he got very dizzy and felt his HR elevate while in the shower. States since every time he stands his HR elevates and he gets lightheaded.. denies any chest pain or SOB ?

## 2021-06-21 ENCOUNTER — Telehealth: Payer: Self-pay

## 2021-06-21 ENCOUNTER — Ambulatory Visit (INDEPENDENT_AMBULATORY_CARE_PROVIDER_SITE_OTHER): Payer: Medicare Other

## 2021-06-21 ENCOUNTER — Telehealth: Payer: Medicare Other

## 2021-06-21 DIAGNOSIS — I152 Hypertension secondary to endocrine disorders: Secondary | ICD-10-CM

## 2021-06-21 DIAGNOSIS — G894 Chronic pain syndrome: Secondary | ICD-10-CM

## 2021-06-21 DIAGNOSIS — R809 Proteinuria, unspecified: Secondary | ICD-10-CM

## 2021-06-21 DIAGNOSIS — E1169 Type 2 diabetes mellitus with other specified complication: Secondary | ICD-10-CM

## 2021-06-21 DIAGNOSIS — I480 Paroxysmal atrial fibrillation: Secondary | ICD-10-CM

## 2021-06-21 DIAGNOSIS — E1143 Type 2 diabetes mellitus with diabetic autonomic (poly)neuropathy: Secondary | ICD-10-CM

## 2021-06-21 LAB — BASIC METABOLIC PANEL
BUN/Creatinine Ratio: 17 (ref 10–24)
BUN: 24 mg/dL (ref 8–27)
CO2: 23 mmol/L (ref 20–29)
Calcium: 9.7 mg/dL (ref 8.6–10.2)
Chloride: 99 mmol/L (ref 96–106)
Creatinine, Ser: 1.4 mg/dL — ABNORMAL HIGH (ref 0.76–1.27)
Glucose: 172 mg/dL — ABNORMAL HIGH (ref 70–99)
Potassium: 4.4 mmol/L (ref 3.5–5.2)
Sodium: 137 mmol/L (ref 134–144)
eGFR: 51 mL/min/{1.73_m2} — ABNORMAL LOW (ref >59)

## 2021-06-21 LAB — TESTOSTERONE, FREE, TOTAL, SHBG
Sex Hormone Binding: 51.8 nmol/L (ref 19.3–76.4)
Testosterone, Free: 5.6 pg/mL — ABNORMAL LOW (ref 6.6–18.1)
Testosterone: 400 ng/dL (ref 264–916)

## 2021-06-21 NOTE — Patient Instructions (Signed)
Visit Information ? ?Thank you for taking time to visit with me today. Please don't hesitate to contact me if I can be of assistance to you before our next scheduled telephone appointment. ? ?Following are the goals we discussed today:  ?Diabetes:  (Status: Goal on Track (progressing): YES.) Long Term Goal  ?  ?     ?Lab Results  ?Component Value Date  ?  HGBA1C 6.3 (H) 06/17/2021  ?Previous 6.6 ?Assessed patient's understanding of A1c goal: <7%. 06-21-2021: The patient is maintaining his A1C below 7. Knows the goal of therapy. Praised for good control.  ?Provided education to patient about basic DM disease process. 06-21-2021: The patient knows how to effectively manage his DM. Saw the pcp on 06-17-2021 and his DM is stable. Will continue to monitor; ?Reviewed medications with patient and discussed importance of medication adherence. 06-21-2021: The patient states compliance with medications        ?Reviewed prescribed diet with patient Heart Healthy/ADA diet. 06-21-2021: The patient is compliant with heart healthy/ADA diet- reviewed and the patient is compliant with his dietary restrictions.  ?Counseled on importance of regular laboratory monitoring as prescribed. 12-27-  2022: The patient has regular lab work. Review of November labs.  06-21-2021: The patient has regular lab work and is compliant with the plan of care for his DM. Last labs on 06-17-2021 for routine care and follow up.     ?Discussed plans with patient for ongoing care management follow up and provided patient with direct contact information for care management team;      ?Provided patient with written educational materials related to hypo and hyperglycemia and importance of correct treatment. 06-21-2021:  Knows how to handle high and low blood sugars. DM is stable at this time.   Denies any episodes of low blood sugars or high.       ?Reviewed scheduled/upcoming provider appointments including: 10-03-2021 at 1 PM      ?Advised patient, providing education and  rationale, to check cbg as directed  and record. 06-21-2021: The patient monitors blood sugars on a regular basis. Denies any abnormal readings at this time. Will continue to monitor.         ?call provider for findings outside established parameters;       ?Review of patient status, including review of consultants reports, relevant laboratory and other test results, and medications completed;       ?  ?Hyperlipidemia:  (Status: Goal on Track (progressing): YES.) Long Term Goal  ?     ?Lab Results  ?Component Value Date  ?  CHOL 101 04/04/2021  ?  HDL 40 04/04/2021  ?  [place] 43 04/04/2021  ?  TRIG 96 04/04/2021  ?  CHOLHDL 2.6 12/21/2020  ?  ?  ?Medication review performed; medication list updated in electronic medical record. 06-21-2021: The patient is compliant with Crestor 5 mg QD. Denies any issues with medications at this time ?Provider established cholesterol goals reviewed 06-21-2021: Reviewed and praised the patient for being at goal. ?Counseled on importance of regular laboratory monitoring as prescribed. [id number]: The patient has regular lab work done. Last in February of 2023. ?Provided HLD educational materials; ?Reviewed role and benefits of statin for ASCVD risk reduction; ?Discussed strategies to manage statin-induced myalgias; ?Reviewed importance of limiting foods high in cholesterol. 06-21-2021: The patient is at goal. No new issues related to following a heart healthy/ADA diet  ?Reviewed exercise goals and target of 150 minutes per week; ?Screening for signs and symptoms of  depression related to chronic disease state;  ?Assessed social determinant of health barriers;  ?  ?Hypertension: (Status: Goal on Track (progressing): YES.) ?Last practice recorded BP readings:  ?   ?BP Readings from Last 3 Encounters:  ?06/20/21 (!) 158/82  ?06/17/21 117/65  ?06/08/21 (!) 148/78  ?Most recent eGFR/CrCl:  ?     ?Lab Results  ?Component Value Date  ?  EGFR 51 (L) 06/17/2021  ?  No components found for: CRCL ?   ?Evaluation of current treatment plan related to hypertension self management and patient's adherence to plan as established by provider.  02-08-2021: The patient is concerned that his blood pressure is elevated first thing in the am around 170/90's in the am and after he sits down it will drop to 101/60. Denies pain or being anxious first thing in the am. Education on the goal of systolic <438 and diastolic <38. Also review of medications and if he is taking them correctly. The patient states compliance with bp medications. The patient may need to follow up with the cardiologist as he ran out of his medications for his AFIB and he can tell his heart is out of rhythm. Education and support given. See AFIB plan of care for more details. 04-19-2021: The patient continues to do cardiac rehab and feels like he is doing well with heart healthy. HTN and blood pressure readings have stabilized. The patient denies any acute needs at this time. 06-21-2021: The patient was at the cardiology office on 06-20-2021 and was sent to the ER to be evaluated. The patient had elevated blood pressures but states his blood pressures have stabilized now. The patient states that he was not feeling the best and his heart rate has been tachycardia at times due to coming off of the Metoprolol. The patient states that they discontinued the Metoprolol since it was causing bradycardia. The patient states that he has been on it for years and never had any problems. Everything checked out well at the ER but the patient is going to be wearing a hollister monitor until Thursday to see if there are any abnormalities that are detected. The patient is frustrated with his health conditions with his heart, back and leg pain right now. Empathetic listening and support given.  ?Provided education to patient re: stroke prevention, s/s of heart attack and stroke. 06-21-2021:: Review of elevations in blood pressure putting the patient at higher risk of heart attack  and stroke. The patient had elevations at the ER on 06-20-2021. The patient was checked out and cleared of any issues related to HA or stroke. Is wearing a hollister monitor currently until 06-23-2021. ?Reviewed prescribed diet heart healthy/ADA diet. 06-20-2021 The patient states that he is compliant. Denies any issues with compliance or following dietary restrictions.   ?Reviewed medications with patient and discussed importance of compliance. 06-21-2021 States compliance with blood pressure medications.  Has been discontinued off of the Metoprolol for now. Is working with the cardiologist.  ?Discussed plans with patient for ongoing care management follow up and provided patient with direct contact information for care management team; ?Advised patient, providing education and rationale, to monitor blood pressure daily and record, calling PCP for findings outside established parameters;  ?Advised patient to discuss recent hospitalization  with provider; ?Provided education on prescribed diet heart healthy diet. 04-19-2021: Review and education provided;  ?Discussed complications of poorly controlled blood pressure such as heart disease, stroke, circulatory complications, vision complications, kidney impairment, sexual dysfunction;  ?Evaluation of post discharge  from the hospital due to a NSTEMI. The patient was hospitalized from 12-20-2020 to 12-23-2020. The patient underwent heart catheterization and had a stent placed. The patient states that he is doing well. The patient will follow up with the PA for cardiology today. The patient is concerned as he had went to the doctor about the chest pain he was having. Explained the risk for increased incidence of heart attack and stroke with the multiple chronic conditions the patient has. Also discussed how lifestyle choices, hereditary factors and other things may contribute to higher risk. The patient is concerned over his children. Education and support given. 06-21-2021: The  patient states he has had a time with regulation of his heart health. He was in the ER yesterday after the cardiology office sent him after he came to the office and appeared ill looking and the patients statement

## 2021-06-21 NOTE — Chronic Care Management (AMB) (Signed)
?Chronic Care Management  ? ?CCM RN Visit Note ? ?06/21/2021 ?Name: Sean Reyes. MRN: [id number] DOB: 04-17-1940 ? ?Subjective: ?Sean Reyes. is a 81 y.o. year old male who is a primary care patient of [name], [name], NP. The care management team was consulted for assistance with disease management and care coordination needs.   ? ?Engaged with patient by telephone for follow up visit in response to provider referral for case management and/or care coordination services.  ? ?Consent to Services:  ?The patient was given information about Chronic Care Management services, agreed to services, and gave verbal consent prior to initiation of services.  Please see initial visit note for detailed documentation.  ? ?Patient agreed to services and verbal consent obtained.  ? ?Assessment: Review of patient past medical history, allergies, medications, health status, including review of consultants reports, laboratory and other test data, was performed as part of comprehensive evaluation and provision of chronic care management services.  ? ?SDOH (Social Determinants of Health) assessments and interventions performed:   ? ?CCM Care Plan ? ?Allergies  ?Allergen Reactions  ? Levaquin [Levofloxacin In D5w] Anaphylaxis and Shortness Of Breath  ? Shellfish Allergy Anaphylaxis  ?  Has used duraprep, betadine and ioban in previous surgeries in 2019 and 2018 without issue  ? Amiodarone Other (See Comments)  ?  Tremors and thyroid toxicity  ? Adhesive [Tape] Other (See Comments)  ?  Little red bumps under the dressing.  He questions whether is latex related  ? ? ?Outpatient Encounter Medications as of 06/21/2021  ?Medication Sig  ? acetaminophen (TYLENOL) 500 MG tablet Take 500 mg by mouth every 6 (six) hours as needed (pain).  ? clopidogrel (PLAVIX) 75 MG tablet Take 75 mg by mouth daily.  ? cyanocobalamin 1000 MCG tablet Take by mouth.  ? ELIQUIS 5 MG TABS tablet Take 1 tablet (5 mg total) by mouth 2 (two) times daily.  ?  finasteride (PROSCAR) 5 MG tablet Take 1 tablet (5 mg total) by mouth daily.  ? isosorbide mononitrate (IMDUR) 30 MG 24 hr tablet Take 30 mg by mouth daily.  ? metFORMIN (GLUCOPHAGE) 500 MG tablet Take 1 tablet (500 mg total) by mouth 2 (two) times daily with a meal.  ? omeprazole (PRILOSEC) 40 MG capsule Take 40 mg by mouth daily.  ? pregabalin (LYRICA) 25 MG capsule Take 1 capsule (25 mg total) by mouth daily.  ? propafenone (RYTHMOL SR) 425 MG 12 hr capsule Take 425 mg by mouth 2 (two) times daily. Has been out for 3 days  ? rosuvastatin (CRESTOR) 5 MG tablet Take 1 tablet (5 mg total) by mouth daily.  ? solifenacin (VESICARE) 5 MG tablet Take 5 mg by mouth daily.  ? tamsulosin (FLOMAX) 0.4 MG CAPS capsule Take 0.8 mg by mouth daily.  ? ?No facility-administered encounter medications on file as of 06/21/2021.  ? ? ?Patient Active Problem List  ? Diagnosis Date Noted  ? Bradycardia 06/15/2021  ? Diabetes mellitus with proteinuria ([place]) 04/04/2021  ? GERD without esophagitis 04/02/2021  ? Coronary artery disease 12/28/2020  ? History of non-ST elevation myocardial infarction (NSTEMI) 12/21/2020  ? Pain in right shin 10/25/2020  ? Peripheral vascular disease ([place]) 08/06/2020  ? Chronic pain syndrome 07/06/2020  ? Spinal cord stimulator status 12/11/2019  ? History of 2019 novel coronavirus disease (COVID-19) 10/02/2019  ? Fatigue 07/30/2019  ? CKD (chronic kidney disease) stage 3, GFR 30-59 ml/min (HCC) 01/19/2019  ? Acquired thrombophilia ([place]) 01/19/2019  ?  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 Postlaminectomy syndrome, lumbar region 01/16/2019  ? History of fusion of lumbar spine (L2-L5) 01/16/2019  ? Chronic radicular lumbar pain 01/16/2019  ? HNP (herniated nucleus pulposus), lumbar 04/29/2018  ? Advanced care planning/counseling discussion 09/28/2016  ? Spinal stenosis, lumbar region, 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/13/2016  ? Hyperlipidemia associated with type 2 diabetes mellitus ([place]) 07/14/2015  ?  Symptomatic anemia 06/30/2015  ? Benign prostatic hyperplasia without lower urinary tract symptoms 06/02/2015  ? OSA (obstructive sleep apnea) 03/23/2015  ? Hypertension associated with diabetes ([place]) 09/28/2014  ? 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 H/O prior ablation treatment 10/19/2011  ? AF (paroxysmal atrial fibrillation) ([place]) 10/19/2011  ? ? ?Conditions to be addressed/monitored:Atrial Fibrillation, HTN, HLD, DMII, and Chronic pain ? ?Care Plan : RNCM: General Plan of Care (Adult) for Chronic Disease Management and Care Coordination Needs  ?Updates made by Vanita Ingles, RN since 06/21/2021 12:00 AM  ?  ? ?Problem: RNCM: Development of Plan of Care for Chronic Disease Management (DM, HTN, HLD, AFIB,Chronic Pain, Recent HA)   ?Priority: High  ?  ? ?Long-Range Goal: RNCM: Effective Management  of Plan of Care for Chronic Disease Management (DM, HTN, HLD, AFIB,  Chronic Pain, Recent HA)   ?Start Date: 12/28/2020  ?Expected End Date: 12/28/2021  ?Priority: High  ?Note:   ?Current Barriers:  ?Knowledge Deficits related to plan of care for management of HTN, HLD, AFIB, DMII, and Chronic pain   ?Chronic Disease Management support and education needs related to HTN, HLD, AFIB, DMII, and Chronic pain  ? ?RNCM Clinical Goal(s):  ?Patient will verbalize understanding of plan for management of HTN, HLD, AFIB, DMII, and Chronic pain as evidenced by compliance with plan of care, calling the office for changes, and working with the CCM team to effectively manage health and well being  ?take all medications exactly as prescribed and will call provider for medication related questions as evidenced by compliance with medications and calling for refills before running out    ?attend all scheduled medical appointments: 10-03-2021 at 1 pm as evidenced by keeping appointments and calling for rescheduling needs         ?demonstrate improved and ongoing health management independence as evidenced by  working with the CCM team to effectively manage health and well being         ?demonstrate a decrease in HTN, HLD, AFIB, DMII, and chronic pain  exacerbations  as evidenced by stable conditions, no acute changes, and working with the CCM team to effectively manage health and well being  ?demonstrate ongoing self health care management ability for effective management of chronic condtions  as evidenced by working with the CCM team  through collaboration with Consulting civil engineer, provider, and care team.  ? ?Interventions: ?1:1 collaboration with primary care provider regarding development and update of comprehensive plan of care as evidenced by provider attestation and co-signature ?Inter-disciplinary care team collaboration (see longitudinal plan of care) ?Evaluation of current treatment plan related to  self management and patient's adherence to plan as established by provider ? ? ?SDOH Barriers (Status: Goal on Track (progressing): YES.) Long Term Goal  ?Patient interviewed and SDOH assessment performed ?       ?Patient interviewed and appropriate assessments performed ?Provided patient with information about resources available in the community and care guides to assist with changes in SDOH, or new needs  ?Discussed plans with patient for ongoing care management follow up and provided  patient with direct contact information for care management team ?Advised patient to call the office for changes in SDOH, questions, or concerns ? ? ? ?Diabetes:  (Status: Goal on Track (progressing): YES.) Long Term Goal  ? ?Lab Results  ?Component Value Date  ? HGBA1C 6.3 (H) 06/17/2021  ?Previous 6.6 ?Assessed patient's understanding of A1c goal: <7%. 06-21-2021: The patient is maintaining his A1C below 7. Knows the goal of therapy. Praised for good control.  ?Provided education to patient about basic DM disease process. 06-21-2021: The patient knows how to effectively manage his DM. Saw the pcp on 06-17-2021 and his DM is stable. Will  continue to monitor; ?Reviewed medications with patient and discussed importance of medication adherence. 06-21-2021: The patient states compliance with medications        ?Reviewed prescribed diet with patient [facility]

## 2021-06-21 NOTE — Progress Notes (Signed)
Contacted via [place] ? ? ?Good afternoon, I see you went to ER yesterday to get that ticker looked at.  Hope all is well.  Testosterone total is normal, but Free remains low.  I recommend you discuss with urology at your next visit with them.  For now until heart is improved, testosterone replacement may not be a good idea:) ?

## 2021-06-21 NOTE — Telephone Encounter (Signed)
?  Care Management  ? ?Follow Up Note ? ? ?06/21/2021 ?Name: Sean Reyes. MRN: [id number] DOB: 12-29-1940 ? ? ?Referred by: [name], NP ?Reason for referral : Chronic Care Management (RNCM: Follow up for Chronic Disease Management and Care Coordination Needs ) ? ? ?The patient called the office back and a call was returned to the patient. The call was completed. See new encounter.  ? ?Follow Up Plan: Telephone follow up appointment with care management team member scheduled for: 08-09-2021 at 0945 am ? ?Noreene Larsson RN, MSN, CCM ?Community Care Coordinator ?[facility] ?[place] ?Mobile: [phone]  ?

## 2021-06-22 ENCOUNTER — Telehealth: Payer: Self-pay | Admitting: *Deleted

## 2021-06-22 NOTE — Telephone Encounter (Signed)
Transition Care Management Follow-up Telephone Call ?Date of discharge and from where: [facility] 06-20-2021 ?How have you been since you were released from the hospital? Feeling ok checking heart rate ?Any questions or concerns? No ? ?Items Reviewed: ?Did the pt receive and understand the discharge instructions provided? Yes  ?Medications obtained and verified? Yes  ?Other? No  ?Any new allergies since your discharge? No  ?Dietary orders reviewed? No ?Do you have support at home? Yes  ? ?Home Care and Equipment/Supplies: ?Were home health services ordered? not applicable ?If so, what is the name of the agency?   ?Has the agency set up a time to come to the patient's home? not applicable ?Were any new equipment or medical supplies ordered?  No ?What is the name of the medical supply agency?  ?Were you able to get the supplies/equipment? not applicable ?Do you have any questions related to the use of the equipment or supplies? No ? ?Functional Questionnaire: (I = Independent and D = Dependent) ?ADLs: I ? ?Bathing/Dressing- I ? ?Meal Prep- I ? ?Eating- I ? ?Maintaining continence- I ? ?Transferring/Ambulation- I ? ?Managing Meds- I ? ?Follow up appointments reviewed: ? ?PCP Hospital f/u appt confirmed? No   ?Specialist Hospital f/u appt confirmed? PATIENT HAS SEEN CARDIOLOGIST ?Are transportation arrangements needed? No  ?If their condition worsens, is the pt aware to call PCP or go to the Emergency Dept.? Yes ?Was the patient provided with contact information for the PCP's office or ED? Yes ?Was to pt encouraged to call back with questions or concerns? Yes  ?

## 2021-06-23 ENCOUNTER — Ambulatory Visit
Admission: RE | Admit: 2021-06-23 | Discharge: 2021-06-23 | Disposition: A | Discharge status: Home / Self Care | Payer: Medicare Other | Source: Ambulatory Visit | Attending: Neurosurgery | Admitting: Neurosurgery

## 2021-06-23 ENCOUNTER — Other Ambulatory Visit: Payer: Self-pay | Admitting: Neurosurgery

## 2021-06-23 DIAGNOSIS — R29898 Other symptoms and signs involving the musculoskeletal system: Secondary | ICD-10-CM

## 2021-06-23 DIAGNOSIS — M5136 Other intervertebral disc degeneration, lumbar region: Secondary | ICD-10-CM | POA: Insufficient documentation

## 2021-06-23 DIAGNOSIS — M419 Scoliosis, unspecified: Secondary | ICD-10-CM

## 2021-06-23 DIAGNOSIS — M2578 Osteophyte, vertebrae: Secondary | ICD-10-CM | POA: Diagnosis not present

## 2021-06-23 DIAGNOSIS — M47812 Spondylosis without myelopathy or radiculopathy, cervical region: Secondary | ICD-10-CM | POA: Diagnosis not present

## 2021-06-23 DIAGNOSIS — M4326 Fusion of spine, lumbar region: Secondary | ICD-10-CM | POA: Diagnosis not present

## 2021-06-23 DIAGNOSIS — M51369 Other intervertebral disc degeneration, lumbar region without mention of lumbar back pain or lower extremity pain: Secondary | ICD-10-CM

## 2021-06-23 DIAGNOSIS — M48061 Spinal stenosis, lumbar region without neurogenic claudication: Secondary | ICD-10-CM | POA: Diagnosis not present

## 2021-06-23 DIAGNOSIS — M4319 Spondylolisthesis, multiple sites in spine: Secondary | ICD-10-CM | POA: Diagnosis not present

## 2021-06-23 DIAGNOSIS — R531 Weakness: Secondary | ICD-10-CM | POA: Diagnosis not present

## 2021-06-23 DIAGNOSIS — M4316 Spondylolisthesis, lumbar region: Secondary | ICD-10-CM | POA: Diagnosis not present

## 2021-07-05 DIAGNOSIS — I480 Paroxysmal atrial fibrillation: Secondary | ICD-10-CM | POA: Diagnosis not present

## 2021-07-13 DIAGNOSIS — E1169 Type 2 diabetes mellitus with other specified complication: Secondary | ICD-10-CM

## 2021-07-13 DIAGNOSIS — I480 Paroxysmal atrial fibrillation: Secondary | ICD-10-CM

## 2021-07-13 DIAGNOSIS — E1129 Type 2 diabetes mellitus with other diabetic kidney complication: Secondary | ICD-10-CM

## 2021-07-13 DIAGNOSIS — E1143 Type 2 diabetes mellitus with diabetic autonomic (poly)neuropathy: Secondary | ICD-10-CM

## 2021-07-13 DIAGNOSIS — R809 Proteinuria, unspecified: Secondary | ICD-10-CM

## 2021-07-13 DIAGNOSIS — E1159 Type 2 diabetes mellitus with other circulatory complications: Secondary | ICD-10-CM

## 2021-07-13 DIAGNOSIS — E785 Hyperlipidemia, unspecified: Secondary | ICD-10-CM

## 2021-07-13 DIAGNOSIS — I10 Essential (primary) hypertension: Secondary | ICD-10-CM | POA: Diagnosis not present

## 2021-07-13 DIAGNOSIS — I152 Hypertension secondary to endocrine disorders: Secondary | ICD-10-CM

## 2021-07-19 DIAGNOSIS — G4733 Obstructive sleep apnea (adult) (pediatric): Secondary | ICD-10-CM | POA: Diagnosis not present

## 2021-08-02 ENCOUNTER — Ambulatory Visit: Payer: Self-pay | Admitting: *Deleted

## 2021-08-02 DIAGNOSIS — G629 Polyneuropathy, unspecified: Secondary | ICD-10-CM | POA: Diagnosis not present

## 2021-08-02 DIAGNOSIS — M5136 Other intervertebral disc degeneration, lumbar region: Secondary | ICD-10-CM | POA: Diagnosis not present

## 2021-08-02 DIAGNOSIS — R29898 Other symptoms and signs involving the musculoskeletal system: Secondary | ICD-10-CM | POA: Diagnosis not present

## 2021-08-02 DIAGNOSIS — Z9689 Presence of other specified functional implants: Secondary | ICD-10-CM | POA: Diagnosis not present

## 2021-08-02 DIAGNOSIS — M419 Scoliosis, unspecified: Secondary | ICD-10-CM | POA: Diagnosis not present

## 2021-08-02 NOTE — Telephone Encounter (Signed)
  Chief Complaint: SOB with exertion Symptoms: SOB with exertion, weakness Frequency: chronic- but seems worse- patient has had conversation with cardiologist and is seeing ortho today Pertinent Negatives: Patient denies dizziness, runny nose, cough, chest pain, fever Disposition: '[]'$ ED /'[]'$ Urgent Care (no appt availability in office) / '[]'$ Appointment(In office/virtual)/ '[]'$  [facility]/ '[]'$ Home Care/ '[x]'$ Refused Recommended Disposition /'[]'$ [facility]/ '[]'$  Follow-up with PCP Additional Notes: Patient request appointment with PCP only- Sean Reyes - offered other options- but only wants to see provider. Advised I would send message to office with request

## 2021-08-02 NOTE — Telephone Encounter (Signed)
Left a message for patient to notify him that we have scheduled him with Sean Reyes on Thursday at 9:40 am. Advised him to give our office a call back if he has any concerns or questions.

## 2021-08-02 NOTE — Telephone Encounter (Signed)
Spoke with patient and notified him of Jolene's recommendations and patient states that he is over his current Cardiologist office as he feels they are not doing what needs to be done for him, as he feels he has more blockages. Patient states he had a telephone visit with the PA at his Cardiologist office yesterday and was not satisfied and says he would like to discuss with Jolene in an visit as he trust her recommendations and he would like to see her to show her the diagram report to show her his other blockages. Patient was added to provider's schedule on Thursday at 9:40 am. Please advise?

## 2021-08-02 NOTE — Telephone Encounter (Signed)
Reason for Disposition  [1] Longstanding difficulty breathing (e.g., CHF, COPD, emphysema) AND [2] WORSE than normal  Answer Assessment - Initial Assessment Questions 1. RESPIRATORY STATUS: "Describe your breathing?" (e.g., wheezing, shortness of breath, unable to speak, severe coughing)      SOB- has COPD, is having back pain today-seeing ortho today 2. ONSET: "When did this breathing problem begin?"      Out of breath, weakness 3. PATTERN "Does the difficult breathing come and go, or has it been constant since it started?"      Comes and foes with exertion 4. SEVERITY: "How bad is your breathing?" (e.g., mild, moderate, severe)    - MILD: No SOB at rest, mild SOB with walking, speaks normally in sentences, can lie down, no retractions, pulse < 100.    - MODERATE: SOB at rest, SOB with minimal exertion and prefers to sit, cannot lie down flat, speaks in phrases, mild retractions, audible wheezing, pulse 100-120.    - SEVERE: Very SOB at rest, speaks in single words, struggling to breathe, sitting hunched forward, retractions, pulse > 120      mild 5. RECURRENT SYMPTOM: "Have you had difficulty breathing before?" If Yes, ask: "When was the last time?" and "What happened that time?"      Yes- irregular heart rate 6. CARDIAC HISTORY: "Do you have any history of heart disease?" (e.g., heart attack, angina, bypass surgery, angioplasty)      Arrhythmia  7. LUNG HISTORY: "Do you have any history of lung disease?"  (e.g., pulmonary embolus, asthma, emphysema)     COPD 8. CAUSE: "What do you think is causing the breathing problem?"      Moving around 9. OTHER SYMPTOMS: "Do you have any other symptoms? (e.g., dizziness, runny nose, cough, chest pain, fever)     Back pain 10. O2 SATURATION MONITOR:  "Do you use an oxygen saturation monitor (pulse oximeter) at home?" If Yes, "What is your reading (oxygen level) today?" "What is your usual oxygen saturation reading?" (e.g., 95%)         11. PREGNANCY:  "Is there any chance you are pregnant?" "When was your last menstrual period?"         12. TRAVEL: "Have you traveled out of the country in the last month?" (e.g., travel history, exposures)  Protocols used: Breathing Difficulty-A-AH

## 2021-08-02 NOTE — Telephone Encounter (Signed)
Routing to provider to advise.  

## 2021-08-03 NOTE — Telephone Encounter (Signed)
Patient was made aware of appointment with Yvonna Alanis, CMA when called to confirm his appointment. Patient verbalized understanding.

## 2021-08-03 NOTE — Telephone Encounter (Signed)
Noted  

## 2021-08-04 ENCOUNTER — Ambulatory Visit
Admission: RE | Admit: 2021-08-04 | Discharge: 2021-08-04 | Disposition: A | Discharge status: Home / Self Care | Payer: Medicare Other | Attending: Nurse Practitioner | Admitting: Nurse Practitioner

## 2021-08-04 ENCOUNTER — Ambulatory Visit
Admission: RE | Admit: 2021-08-04 | Discharge: 2021-08-04 | Disposition: A | Discharge status: Home / Self Care | Payer: Medicare Other | Source: Ambulatory Visit | Attending: Nurse Practitioner | Admitting: Nurse Practitioner

## 2021-08-04 ENCOUNTER — Ambulatory Visit (INDEPENDENT_AMBULATORY_CARE_PROVIDER_SITE_OTHER): Payer: Medicare Other | Admitting: Nurse Practitioner

## 2021-08-04 ENCOUNTER — Encounter: Payer: Self-pay | Admitting: Nurse Practitioner

## 2021-08-04 VITALS — BP 106/70 | HR 78 | Temp 97.5°F | Ht 73.0 in | Wt 183.4 lb

## 2021-08-04 DIAGNOSIS — E538 Deficiency of other specified B group vitamins: Secondary | ICD-10-CM | POA: Diagnosis not present

## 2021-08-04 DIAGNOSIS — G894 Chronic pain syndrome: Secondary | ICD-10-CM

## 2021-08-04 DIAGNOSIS — I252 Old myocardial infarction: Secondary | ICD-10-CM

## 2021-08-04 DIAGNOSIS — R0602 Shortness of breath: Secondary | ICD-10-CM | POA: Diagnosis not present

## 2021-08-04 DIAGNOSIS — I480 Paroxysmal atrial fibrillation: Secondary | ICD-10-CM

## 2021-08-04 DIAGNOSIS — E1143 Type 2 diabetes mellitus with diabetic autonomic (poly)neuropathy: Secondary | ICD-10-CM | POA: Diagnosis not present

## 2021-08-04 DIAGNOSIS — D649 Anemia, unspecified: Secondary | ICD-10-CM

## 2021-08-04 DIAGNOSIS — I25110 Atherosclerotic heart disease of native coronary artery with unstable angina pectoris: Secondary | ICD-10-CM | POA: Diagnosis not present

## 2021-08-04 DIAGNOSIS — R5383 Other fatigue: Secondary | ICD-10-CM | POA: Diagnosis not present

## 2021-08-04 MED ORDER — PREGABALIN 25 MG PO CAPS: 25.0000 mg | ORAL_CAPSULE | Freq: Two times a day (BID) | ORAL | 2 refills | Status: DC

## 2021-08-04 NOTE — Progress Notes (Signed)
Contacted via [place] evening Edgardo, your imaging has returned and is normal with no pneumonia or acute findings:)

## 2021-08-04 NOTE — Patient Instructions (Signed)
Shortness of Breath, Adult Shortness of breath means you have trouble breathing. Shortness of breath could be a sign of a medical problem. Follow these instructions at home:  Pollution Do not smoke or use any products that contain nicotine or tobacco. If you need help quitting, ask your doctor. Avoid things that can make it harder to breathe, such as: Smoke of all kinds. This includes smoke from campfires or forest fires. Do not smoke or allow others to smoke in your home. Mold. Dust. Air pollution. Chemical smells. Things that can give you an allergic reaction (allergens) if you have allergies. Keep your living space clean. Use products that help remove mold and dust. General instructions Watch for any changes in your symptoms. Take over-the-counter and prescription medicines only as told by your doctor. This includes oxygen therapy and inhaled medicines. Rest as needed. Return to your normal activities when your doctor says that it is safe. Keep all follow-up visits. Contact a doctor if: Your condition does not get better as soon as expected. You have a hard time doing your normal activities, even after you rest. You have new symptoms. You cannot walk up stairs. You cannot exercise the way you normally do. Get help right away if: Your shortness of breath gets worse. You have trouble breathing when you are resting. You feel light-headed or you faint. You have a cough that is not helped by medicines. You cough up blood. You have pain with breathing. You have pain in your chest, arms, shoulders, or belly (abdomen). You have a fever. These symptoms may be an emergency. Get help right away. Call 911. Do not wait to see if the symptoms will go away. Do not drive yourself to the hospital. Summary Shortness of breath is when you have trouble breathing enough air. It can be a sign of a medical problem. Avoid things that make it hard for you to breathe, such as smoking, pollution,  mold, and dust. Watch for any changes in your symptoms. Contact your doctor if you do not get better or you get worse. This information is not intended to replace advice given to you by your health care provider. Make sure you discuss any questions you have with your health care provider. Document Revised: 09/18/2020 Document Reviewed: 09/18/2020 Elsevier Patient Education  2023 [employer]  

## 2021-08-04 NOTE — Assessment & Plan Note (Signed)
Chronic, ongoing with A1c 6.3% last visit and urine ALB 80 (February 2023).  Significant decreased sensation bilateral feet, monitor closely for wounds and falls.  Continue current diabetes medication regimen and adjust as needed.  Will increase Lyrica to 25 MG BID and if tolerated adjust further.  Did not have benefit from Gabapentin in past.  Discussed at length with him and educated him on this, also discussed possible trial of Duloxetine with Lyrica in future.  Would benefit from return to Benazepril in future or alternate ACE or ARB for kidney protection with proteinuria.  Continue to monitor BS daily and document for visits.

## 2021-08-04 NOTE — Assessment & Plan Note (Signed)
Refer to fatigue plan of care. 

## 2021-08-04 NOTE — Progress Notes (Signed)
BP 106/70 (BP Location: Left Arm, Cuff Size: Normal)   Pulse 78   Temp (!) 97.5 F (36.4 C) (Oral)   Ht '6\' 1"'$  (1.854 m)   Wt 183 lb 6.4 oz (83.2 kg)   SpO2 98%   BMI 24.20 kg/m    Subjective:    Patient ID: Sean Reyes., male    DOB: 05-21-1940, 81 y.o.   MRN: [id number]  HPI: Quantez Schnyder. is a 81 y.o. male  Chief Complaint  Patient presents with   Cardiologist Follow Up    Patient is here to discuss recent appointment with Cardiologist. Patient says he suggested to the Cardiologist that he may need a Anemia and Iron level checked as he has been feeling extremely tired and weak. Patient states her checked his pulsed today and it was 90.    Peripheral Neuropathy   Fatigue   FATIGUE Complains of ongoing SOB and fatigue, has seen cardiology several times since last visit with PCP.  Last visit was 07/05/21 -- had Holter placed and interpreted noting NSR with frequent PVC -- no a-fib noted. Imdur for chest pain to be continued.  He recommends that he attend cardiac rehab, but he is concerned about this as feeling too short of breath.  Reports walking in to office today made him short of breath and tired + his heart rate went to 100 range.  Would like to have a second opinion with cardiology.  Has CPAP, but awaiting new equipment.  - History of NSTEMI 12/20/20 -- during catheterization on 12/22/20 LVEF noted to be 45-50% + "Three-vessel coronary artery disease with 70% stenosis very distal segment LAD, diffuse 80% stenosis small caliber AV groove left circumflex, and high-grade 99% stenosis proximal RCA.  - Last echo was 12/21/20 noting EF 65-70% with normal left ventricle function.  Mild left ventricular hypertrophy.   - Successful PCI with DES proximal RCA".  Had stress testing on 01/11/21 "Normal LV systolic function with ejection fraction of 60% ". - Has seen hematology for anemia -- last visit 06/08/21 Duration:  months Severity: 10/10  Onset: gradual Context when symptoms  started:  unknown Symptoms improve with rest: yes  Depressive symptoms: no Stress/anxiety: no Insomnia: none Snoring: yes Observed apnea by bed partner: no Daytime hypersomnolence:no Wakes feeling refreshed: yes History of sleep study: yes Dysnea on exertion:  yes Orthopnea/PND: no Chest pain: no Chronic cough: no Lower extremity edema: no Arthralgias:yes Myalgias: no Weakness: yes Rash: no   NEUROPATHY Is followed by neurosurgery, PA Derrill Kay, with recent visit 08/02/21.  Has spinal cord stimulator in place.  At recent visit with neurosurgery was provided short period of Tramadol.  They have recommended he follow-up with Dr. Holley Raring with chronic pain management for additional recommendations.  He did have a visit with Dr. Manuella Ghazi, with neurology, initially on 04/28/21 for severe peripheral neuropathy and weakness, he reports they told him there was not much more they could do.    He spoke to spinal cord stimulator company yesterday and they adjusted settings, which has helped with pain today.  Started Lyrica last visit at lowest dose, has not seen benefit from this yet. Neuropathy status: stable  Satisfied with current treatment?: yes Medication side effects: no Medication compliance:  good compliance Location: legs and feet Pain: yes Severity: 7/10  Quality:  dull, aching, burning, throbbing, tingling, creeping, and pins and needles Frequency: intermittent Bilateral: yes Symmetric: yes Numbness: yes Decreased sensation: yes Weakness: yes Context: fluctuating Alleviating factors: nothing Aggravating factors:  movement Treatments attempted: as above  Relevant past medical, surgical, family and social history reviewed and updated as indicated. Interim medical history since our last visit reviewed. Allergies and medications reviewed and updated.  Review of Systems  Constitutional:  Negative for activity change, appetite change, diaphoresis, fatigue and fever.  Respiratory:   Negative for cough, chest tightness, shortness of breath and wheezing.   Cardiovascular:  Negative for chest pain, palpitations and leg swelling.  Gastrointestinal: Negative.   Endocrine: Negative for cold intolerance, heat intolerance, polydipsia, polyphagia and polyuria.  Neurological: Negative.   Psychiatric/Behavioral: Negative.      Per HPI unless specifically indicated above     Objective:    BP 106/70 (BP Location: Left Arm, Cuff Size: Normal)   Pulse 78   Temp (!) 97.5 F (36.4 C) (Oral)   Ht '6\' 1"'$  (1.854 m)   Wt 183 lb 6.4 oz (83.2 kg)   SpO2 98%   BMI 24.20 kg/m   Wt Readings from Last 3 Encounters:  08/04/21 183 lb 6.4 oz (83.2 kg)  06/20/21 187 lb (84.8 kg)  06/17/21 187 lb 9.6 oz (85.1 kg)    Physical Exam Vitals and nursing note reviewed.  Constitutional:      General: He is awake. He is not in acute distress.    Appearance: He is well-developed and well-groomed. He is not ill-appearing.  HENT:     Head: Normocephalic and atraumatic.     Right Ear: Hearing normal. No drainage.     Left Ear: Hearing normal. No drainage.     Mouth/Throat:     Mouth: Mucous membranes are moist.     Pharynx: Oropharynx is clear.     Comments: Mucus membranes pink. Eyes:     General: Lids are normal.        Right eye: No discharge.        Left eye: No discharge.     Conjunctiva/sclera: Conjunctivae normal.     Pupils: Pupils are equal, round, and reactive to light.  Neck:     Thyroid: No thyromegaly.     Vascular: No carotid bruit.     Trachea: Trachea normal.  Cardiovascular:     Rate and Rhythm: Normal rate and regular rhythm.     Heart sounds: Normal heart sounds, S1 normal and S2 normal. No murmur heard.    No gallop.  Pulmonary:     Effort: Pulmonary effort is normal. No accessory muscle usage or respiratory distress.     Breath sounds: Normal breath sounds.     Comments: Mild shortness of breath noted with walking and occasionally with talking. Abdominal:      General: Bowel sounds are normal.     Palpations: Abdomen is soft.  Musculoskeletal:        General: Normal range of motion.     Cervical back: Normal range of motion and neck supple.     Right lower leg: No edema.     Left lower leg: No edema.  Lymphadenopathy:     Cervical: No cervical adenopathy.  Skin:    General: Skin is warm and dry.     Capillary Refill: Capillary refill takes less than 2 seconds.  Neurological:     Mental Status: He is alert and oriented to person, place, and time.     Deep Tendon Reflexes:     Reflex Scores:      Patellar reflexes are 1+ on the right side and 1+ on the left side.  Achilles reflexes are 1+ on the right side and 1+ on the left side.    Comments: Antalgic gait, using cane.  Psychiatric:        Attention and Perception: Attention normal.        Mood and Affect: Mood normal.        Speech: Speech normal.        Behavior: Behavior normal. Behavior is cooperative.        Thought Content: Thought content normal.     Results for orders placed or performed during the hospital encounter of 97/35/32  Basic metabolic panel  Result Value Ref Range   Sodium 135 135 - 145 mmol/L   Potassium 4.4 3.5 - 5.1 mmol/L   Chloride 103 98 - 111 mmol/L   CO2 26 22 - 32 mmol/L   Glucose, Bld 245 (H) 70 - 99 mg/dL   BUN 21 8 - 23 mg/dL   Creatinine, Ser 1.26 (H) 0.61 - 1.24 mg/dL   Calcium 9.7 8.9 - 10.3 mg/dL   GFR, Estimated 58 (L) >60 mL/min   Anion gap 6 5 - 15  CBC  Result Value Ref Range   WBC 6.3 4.0 - 10.5 K/uL   RBC 3.70 (L) 4.22 - 5.81 MIL/uL   Hemoglobin 11.3 (L) 13.0 - 17.0 g/dL   HCT 34.6 (L) 39.0 - 52.0 %   MCV 93.5 80.0 - 100.0 fL   MCH 30.5 26.0 - 34.0 pg   MCHC 32.7 30.0 - 36.0 g/dL   RDW 13.7 11.5 - 15.5 %   Platelets 147 (L) 150 - 400 K/uL   nRBC 0.0 0.0 - 0.2 %  Troponin I (High Sensitivity)  Result Value Ref Range   Troponin I (High Sensitivity) 8 <18 ng/L      Assessment & Plan:   Problem List Items Addressed This  Visit       Cardiovascular and Mediastinum   AF (paroxysmal atrial fibrillation) (HCC)    Chronic, ongoing.  Followed by cardiology, however he wishes a second opinion on heart health -- will place referral today.  HR regular today with stable rate on exam.  Continue current medication regimen.      Relevant Orders   Ambulatory referral to Cardiology   Comprehensive metabolic panel   Coronary artery disease    Ongoing, continue collaboration with cardiology and current medication regimen.  He wishes second opinion on heart health, will place referral to [facility] [place] and obtain updated echo.      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  Significant decreased sensation bilateral feet, monitor closely for wounds and falls.  Continue current diabetes medication regimen and adjust as needed.  Will increase Lyrica to 25 MG BID and if tolerated adjust further.  Did not have benefit from Gabapentin in past.  Discussed at length with him and educated him on this, also discussed possible trial of Duloxetine with Lyrica in future.  Would benefit from return to Benazepril in future or alternate ACE or ARB for kidney protection with proteinuria.  Continue to monitor BS daily and document for visits.        Other   Chronic pain syndrome    Chronic, ongoing.  Followed by neurosurgery and pain management as needed.  Continue this collaboration and  current regimen.      Relevant Medications   traMADol (ULTRAM) 50 MG tablet   pregabalin (LYRICA) 25 MG capsule   Fatigue    Ongoing issue with worsening post NSTEMI.  Recheck labs today.  Suspect multifactorial with main issue being need for new CPAP and neuropathy. Discussed at length with him today. He would like second opinion on heart health, will place  this referral.  Obtain new echo.  Could consider pulmonary referral in future if ongoing SBE.      Relevant Orders   Ambulatory referral to Cardiology   TSH   B Nat Peptide   History of non-ST elevation myocardial infarction (NSTEMI)    On 12/20/20 -- at this time continue collaboration with cardiology and current medication regimen.  He would like second opinion on heart health, will place this referral.      Relevant Orders   Ambulatory referral to Cardiology   Comprehensive metabolic panel   B Nat Peptide   Shortness of breath on exertion    Refer to fatigue plan of care.      Relevant Orders   B Nat Peptide   DG Chest 2 View   ECHOCARDIOGRAM COMPLETE   Symptomatic anemia    Chronic, stable.  Continue collaboration with hematology, recent notes and labs reviewed.  Obtain labs today.      Relevant Orders   Iron Binding Cap (TIBC)([facility])   Ferritin   CBC with Differential/Platelet   Vitamin B12   Other Visit Diagnoses     B12 deficiency       History of low levels, recheck on labs today.   Relevant Orders   Vitamin B12        Follow up plan: Return in about 2 weeks (around 08/18/2021) for SBE and FATIGUE.

## 2021-08-04 NOTE — Assessment & Plan Note (Signed)
On 12/20/20 -- at this time continue collaboration with cardiology and current medication regimen.  He would like second opinion on heart health, will place this referral.

## 2021-08-04 NOTE — Assessment & Plan Note (Signed)
Chronic, ongoing.  Followed by cardiology, however he wishes a second opinion on heart health -- will place referral today.  HR regular today with stable rate on exam.  Continue current medication regimen.

## 2021-08-04 NOTE — Assessment & Plan Note (Signed)
Ongoing, continue collaboration with cardiology and current medication regimen.  He wishes second opinion on heart health, will place referral to [facility] [place] and obtain updated echo.

## 2021-08-04 NOTE — Assessment & Plan Note (Signed)
Chronic, ongoing.  Followed by neurosurgery and pain management as needed.  Continue this collaboration and current regimen. 

## 2021-08-04 NOTE — Assessment & Plan Note (Signed)
Chronic, stable.  Continue collaboration with hematology, recent notes and labs reviewed.  Obtain labs today.

## 2021-08-04 NOTE — Assessment & Plan Note (Addendum)
Ongoing issue with worsening post NSTEMI.  Recheck labs today.  Suspect multifactorial with main issue being need for new CPAP and neuropathy. Discussed at length with him today. He would like second opinion on heart health, will place this referral.  Obtain new echo.  Could consider pulmonary referral in future if ongoing SBE.

## 2021-08-05 ENCOUNTER — Emergency Department: Payer: Medicare Other

## 2021-08-05 ENCOUNTER — Other Ambulatory Visit: Payer: Self-pay

## 2021-08-05 ENCOUNTER — Emergency Department
Admission: EM | Admit: 2021-08-05 | Discharge: 2021-08-05 | Disposition: A | Discharge status: Home / Self Care | Payer: Medicare Other | Attending: Emergency Medicine | Admitting: Emergency Medicine

## 2021-08-05 ENCOUNTER — Encounter: Payer: Self-pay | Admitting: Emergency Medicine

## 2021-08-05 DIAGNOSIS — I2510 Atherosclerotic heart disease of native coronary artery without angina pectoris: Secondary | ICD-10-CM | POA: Diagnosis not present

## 2021-08-05 DIAGNOSIS — I10 Essential (primary) hypertension: Secondary | ICD-10-CM | POA: Insufficient documentation

## 2021-08-05 DIAGNOSIS — E119 Type 2 diabetes mellitus without complications: Secondary | ICD-10-CM | POA: Diagnosis not present

## 2021-08-05 DIAGNOSIS — R0789 Other chest pain: Secondary | ICD-10-CM | POA: Insufficient documentation

## 2021-08-05 DIAGNOSIS — R079 Chest pain, unspecified: Secondary | ICD-10-CM | POA: Diagnosis not present

## 2021-08-05 LAB — TSH: TSH: 6.23 u[IU]/mL — ABNORMAL HIGH (ref 0.450–4.500)

## 2021-08-05 LAB — BASIC METABOLIC PANEL
Anion gap: 7 (ref 5–15)
BUN: 21 mg/dL (ref 8–23)
CO2: 28 mmol/L (ref 22–32)
Calcium: 9.1 mg/dL (ref 8.9–10.3)
Chloride: 103 mmol/L (ref 98–111)
Creatinine, Ser: 1.3 mg/dL — ABNORMAL HIGH (ref 0.61–1.24)
GFR, Estimated: 56 mL/min — ABNORMAL LOW (ref >60)
Glucose, Bld: 138 mg/dL — ABNORMAL HIGH (ref 70–99)
Potassium: 4.3 mmol/L (ref 3.5–5.1)
Sodium: 138 mmol/L (ref 135–145)

## 2021-08-05 LAB — CBC
HCT: 33.5 % — ABNORMAL LOW (ref 39.0–52.0)
Hemoglobin: 11.1 g/dL — ABNORMAL LOW (ref 13.0–17.0)
MCH: 30.6 pg (ref 26.0–34.0)
MCHC: 33.1 g/dL (ref 30.0–36.0)
MCV: 92.3 fL (ref 80.0–100.0)
Platelets: 164 10*3/uL (ref 150–400)
RBC: 3.63 MIL/uL — ABNORMAL LOW (ref 4.22–5.81)
RDW: 13 % (ref 11.5–15.5)
WBC: 6.6 10*3/uL (ref 4.0–10.5)
nRBC: 0 % (ref 0.0–0.2)

## 2021-08-05 LAB — CBC WITH DIFFERENTIAL/PLATELET
Basophils Absolute: 0 10*3/uL (ref 0.0–0.2)
Basos: 0 %
EOS (ABSOLUTE): 0.1 10*3/uL (ref 0.0–0.4)
Eos: 1 %
Hematocrit: 35.3 % — ABNORMAL LOW (ref 37.5–51.0)
Hemoglobin: 11.9 g/dL — ABNORMAL LOW (ref 13.0–17.7)
Immature Grans (Abs): 0 10*3/uL (ref 0.0–0.1)
Immature Granulocytes: 0 %
Lymphocytes Absolute: 1.4 10*3/uL (ref 0.7–3.1)
Lymphs: 23 %
MCH: 30.7 pg (ref 26.6–33.0)
MCHC: 33.7 g/dL (ref 31.5–35.7)
MCV: 91 fL (ref 79–97)
Monocytes Absolute: 0.7 10*3/uL (ref 0.1–0.9)
Monocytes: 11 %
Neutrophils Absolute: 4 10*3/uL (ref 1.4–7.0)
Neutrophils: 65 %
Platelets: 176 10*3/uL (ref 150–450)
RBC: 3.87 x10E6/uL — ABNORMAL LOW (ref 4.14–5.80)
RDW: 12.9 % (ref 11.6–15.4)
WBC: 6.2 10*3/uL (ref 3.4–10.8)

## 2021-08-05 LAB — FERRITIN: Ferritin: 168 ng/mL (ref 30–400)

## 2021-08-05 LAB — COMPREHENSIVE METABOLIC PANEL
ALT: 21 IU/L (ref 0–44)
AST: 21 IU/L (ref 0–40)
Albumin/Globulin Ratio: 1.5 (ref 1.2–2.2)
Albumin: 4 g/dL (ref 3.7–4.7)
Alkaline Phosphatase: 61 IU/L (ref 44–121)
BUN/Creatinine Ratio: 17 (ref 10–24)
BUN: 22 mg/dL (ref 8–27)
Bilirubin Total: 0.3 mg/dL (ref 0.0–1.2)
CO2: 23 mmol/L (ref 20–29)
Calcium: 9.5 mg/dL (ref 8.6–10.2)
Chloride: 97 mmol/L (ref 96–106)
Creatinine, Ser: 1.28 mg/dL — ABNORMAL HIGH (ref 0.76–1.27)
Globulin, Total: 2.7 g/dL (ref 1.5–4.5)
Glucose: 170 mg/dL — ABNORMAL HIGH (ref 70–99)
Potassium: 4.4 mmol/L (ref 3.5–5.2)
Sodium: 134 mmol/L (ref 134–144)
Total Protein: 6.7 g/dL (ref 6.0–8.5)
eGFR: 57 mL/min/{1.73_m2} — ABNORMAL LOW (ref >59)

## 2021-08-05 LAB — IRON AND TIBC
Iron Saturation: 15 % (ref 15–55)
Iron: 45 ug/dL (ref 38–169)
Total Iron Binding Capacity: 294 ug/dL (ref 250–450)
UIBC: 249 ug/dL (ref 111–343)

## 2021-08-05 LAB — TROPONIN I (HIGH SENSITIVITY)
Troponin I (High Sensitivity): 7 ng/L (ref <18)
Troponin I (High Sensitivity): 7 ng/L (ref <18)

## 2021-08-05 LAB — VITAMIN B12: Vitamin B-12: 602 pg/mL (ref 232–1245)

## 2021-08-05 LAB — BRAIN NATRIURETIC PEPTIDE: BNP: 26.5 pg/mL (ref 0.0–100.0)

## 2021-08-05 NOTE — ED Notes (Signed)
Pt in xray. Family at bedside. 

## 2021-08-08 ENCOUNTER — Telehealth: Payer: Self-pay | Admitting: *Deleted

## 2021-08-09 ENCOUNTER — Telehealth: Payer: Medicare Other

## 2021-08-09 ENCOUNTER — Ambulatory Visit (INDEPENDENT_AMBULATORY_CARE_PROVIDER_SITE_OTHER): Payer: Medicare Other

## 2021-08-09 DIAGNOSIS — E782 Mixed hyperlipidemia: Secondary | ICD-10-CM | POA: Diagnosis not present

## 2021-08-09 DIAGNOSIS — I480 Paroxysmal atrial fibrillation: Secondary | ICD-10-CM | POA: Diagnosis not present

## 2021-08-09 DIAGNOSIS — Z9689 Presence of other specified functional implants: Secondary | ICD-10-CM

## 2021-08-09 DIAGNOSIS — Z01818 Encounter for other preprocedural examination: Secondary | ICD-10-CM | POA: Diagnosis not present

## 2021-08-09 DIAGNOSIS — I10 Essential (primary) hypertension: Secondary | ICD-10-CM | POA: Diagnosis not present

## 2021-08-09 DIAGNOSIS — M79661 Pain in right lower leg: Secondary | ICD-10-CM

## 2021-08-09 DIAGNOSIS — E1169 Type 2 diabetes mellitus with other specified complication: Secondary | ICD-10-CM

## 2021-08-09 DIAGNOSIS — R0602 Shortness of breath: Secondary | ICD-10-CM | POA: Diagnosis not present

## 2021-08-09 DIAGNOSIS — E1159 Type 2 diabetes mellitus with other circulatory complications: Secondary | ICD-10-CM

## 2021-08-09 DIAGNOSIS — I2510 Atherosclerotic heart disease of native coronary artery without angina pectoris: Secondary | ICD-10-CM | POA: Diagnosis not present

## 2021-08-09 DIAGNOSIS — G894 Chronic pain syndrome: Secondary | ICD-10-CM

## 2021-08-09 DIAGNOSIS — G4733 Obstructive sleep apnea (adult) (pediatric): Secondary | ICD-10-CM | POA: Diagnosis not present

## 2021-08-09 DIAGNOSIS — R809 Proteinuria, unspecified: Secondary | ICD-10-CM

## 2021-08-09 DIAGNOSIS — E1143 Type 2 diabetes mellitus with diabetic autonomic (poly)neuropathy: Secondary | ICD-10-CM

## 2021-08-09 NOTE — Chronic Care Management (AMB) (Signed)
Chronic Care Management   CCM RN Visit Note  08/09/2021 Name: Sean Reyes. MRN: [id number] DOB: 09-May-1940  Subjective: Sean Reyes. is a 81 y.o. year old male who is a primary care patient of [name], Dorie Rank, NP. The care management team was consulted for assistance with disease management and care coordination needs.    Engaged with patient by telephone for follow up visit in response to provider referral for case management and/or care coordination services.   Consent to Services:  The patient was given information about Chronic Care Management services, agreed to services, and gave verbal consent prior to initiation of services.  Please see initial visit note for detailed documentation.   Patient agreed to services and verbal consent obtained.   Assessment: Review of patient past medical history, allergies, medications, health status, including review of consultants reports, laboratory and other test data, was performed as part of comprehensive evaluation and provision of chronic care management services.   SDOH (Social Determinants of Health) assessments and interventions performed:    CCM Care Plan  Allergies  Allergen Reactions   Levaquin [Levofloxacin In D5w] Anaphylaxis and Shortness Of Breath   Shellfish Allergy Anaphylaxis    Has used duraprep, betadine and ioban in previous surgeries in 2019 and 2018 without issue   Amiodarone Other (See Comments)    Tremors and thyroid toxicity   Adhesive [Tape] Other (See Comments)    Little red bumps under the dressing.  He questions whether is latex related    Outpatient Encounter Medications as of 08/09/2021  Medication Sig   acetaminophen (TYLENOL) 500 MG tablet Take 500 mg by mouth every 6 (six) hours as needed (pain).   clopidogrel (PLAVIX) 75 MG tablet Take 75 mg by mouth daily.   cyanocobalamin 1000 MCG tablet Take by mouth.   ELIQUIS 5 MG TABS tablet Take 1 tablet (5 mg total) by mouth 2 (two) times daily.    finasteride (PROSCAR) 5 MG tablet Take 1 tablet (5 mg total) by mouth daily.   isosorbide mononitrate (IMDUR) 30 MG 24 hr tablet Take 30 mg by mouth daily.   metFORMIN (GLUCOPHAGE) 500 MG tablet Take 1 tablet (500 mg total) by mouth 2 (two) times daily with a meal.   omeprazole (PRILOSEC) 40 MG capsule Take 40 mg by mouth daily.   pregabalin (LYRICA) 25 MG capsule Take 1 capsule (25 mg total) by mouth 2 (two) times daily.   propafenone (RYTHMOL SR) 425 MG 12 hr capsule Take 425 mg by mouth 2 (two) times daily. Has been out for 3 days   rosuvastatin (CRESTOR) 5 MG tablet Take 1 tablet (5 mg total) by mouth daily.   solifenacin (VESICARE) 5 MG tablet Take 5 mg by mouth daily.   tamsulosin (FLOMAX) 0.4 MG CAPS capsule Take 0.8 mg by mouth daily.   traMADol (ULTRAM) 50 MG tablet Take 50 mg by mouth 2 (two) times daily as needed.   No facility-administered encounter medications on file as of 08/09/2021.    Patient Active Problem List   Diagnosis Date Noted   Shortness of breath on exertion 08/04/2021   Bradycardia 06/15/2021   Diabetes mellitus with proteinuria (HCC) 04/04/2021   GERD without esophagitis 04/02/2021   Coronary artery disease 12/28/2020   History of non-ST elevation myocardial infarction (NSTEMI) 12/21/2020   Pain in right shin 10/25/2020   Peripheral vascular disease (HCC) 08/06/2020   Chronic pain syndrome 07/06/2020   Spinal cord stimulator status 12/11/2019   History of 2019 novel  coronavirus disease (COVID-19) 10/02/2019   Fatigue 07/30/2019   CKD (chronic kidney disease) stage 3, GFR 30-59 ml/min (HCC) 01/19/2019   Acquired thrombophilia (HCC) 01/19/2019   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   Postlaminectomy syndrome, lumbar region 01/16/2019   History of fusion of lumbar spine (L2-L5) 01/16/2019   Chronic radicular lumbar pain 01/16/2019   HNP (herniated nucleus pulposus), lumbar 04/29/2018   Advanced care planning/counseling discussion 09/28/2016   Spinal  stenosis, lumbar region, 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/13/2016   Hyperlipidemia associated with type 2 diabetes mellitus (HCC) 07/14/2015   Symptomatic anemia 06/30/2015   Benign prostatic hyperplasia without lower urinary tract symptoms 06/02/2015   OSA (obstructive sleep apnea) 03/23/2015   Hypertension associated with diabetes (HCC) 09/28/2014   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HCC) 09/28/2014   H/O prior ablation treatment 10/19/2011   AF (paroxysmal atrial fibrillation) (HCC) 10/19/2011    Conditions to be addressed/monitored:Atrial Fibrillation, HTN, HLD, DMII, and Chronic pain and recent visit to ER for chest pain  Care Plan : RNCM: General Plan of Care (Adult) for Chronic Disease Management and Care Coordination Needs  Updates made by Marlowe Sax, RN since 08/09/2021 12:00 AM     Problem: RNCM: Development of Plan of Care for Chronic Disease Management (DM, HTN, HLD, AFIB,Chronic Pain, Recent HA)   Priority: High     Long-Range Goal: RNCM: Effective Management  of Plan of Care for Chronic Disease Management (DM, HTN, HLD, AFIB,  Chronic Pain, Recent HA)   Start Date: 12/28/2020  Expected End Date: 12/28/2021  Priority: High  Note:   Current Barriers:  Knowledge Deficits related to plan of care for management of HTN, HLD, AFIB, DMII, and Chronic pain   Chronic Disease Management support and education needs related to HTN, HLD, AFIB, DMII, and Chronic pain   RNCM Clinical Goal(s):  Patient will verbalize understanding of plan for management of HTN, HLD, AFIB, DMII, and Chronic pain as evidenced by compliance with plan of care, calling the office for changes, and working with the CCM team to effectively manage health and well being  take all medications exactly as prescribed and will call provider for medication related questions as evidenced by compliance with medications and calling for refills before running out    attend all scheduled medical appointments:  with specialist and pcp,  as evidenced by keeping appointments and calling for rescheduling needs         demonstrate improved and ongoing health management independence as evidenced by working with the CCM team to effectively manage health and well being         demonstrate a decrease in HTN, HLD, AFIB, DMII, and chronic pain  exacerbations  as evidenced by stable conditions, no acute changes, and working with the CCM team to effectively manage health and well being  demonstrate ongoing self health care management ability for effective management of chronic condtions  as evidenced by working with the CCM team  through collaboration with Medical illustrator, provider, and care team.   Interventions: 1:1 collaboration with primary care provider regarding development and update of comprehensive plan of care as evidenced by provider attestation and co-signature Inter-disciplinary care team collaboration (see longitudinal plan of care) Evaluation of current treatment plan related to  self management and patient's adherence to plan as established by provider   SDOH Barriers (Status: Goal on Track (progressing): YES.) Long Term Goal  Patient interviewed and SDOH assessment performed        Patient interviewed and  appropriate assessments performed Provided patient with information about resources available in the community and care guides to assist with changes in SDOH, or new needs  Discussed plans with patient for ongoing care management follow up and provided patient with direct contact information for care management team Advised patient to call the office for changes in SDOH, questions, or concerns    Diabetes:  (Status: Goal on Track (progressing): YES.) Long Term Goal   Lab Results  Component Value Date   HGBA1C 6.3 (H) 06/17/2021  Previous 6.6 Assessed patient's understanding of A1c goal: <7%. 08-09-2021: The patient is maintaining his A1C below 7. Knows the goal of therapy. Praised for good  control.  Provided education to patient about basic DM disease process. 08-09-2021: The patient knows how to effectively manage his DM. Saw the pcp on 06-17-2021 and his DM is stable. Will continue to monitor; Reviewed medications with patient and discussed importance of medication adherence. 08-09-2021: The patient states compliance with medications        Reviewed prescribed diet with patient Heart Healthy/ADA diet. 08-09-2021: The patient is compliant with heart healthy/ADA diet- reviewed and the patient is compliant with his dietary restrictions.  Counseled on importance of regular laboratory monitoring as prescribed. 12-27-  2022: The patient has regular lab work. Review of November labs.  08-09-2021: The patient has regular lab work and is compliant with the plan of care for his DM. Last labs on 06-17-2021 for routine care and follow up.     Discussed plans with patient for ongoing care management follow up and provided patient with direct contact information for care management team;      Provided patient with written educational materials related to hypo and hyperglycemia and importance of correct treatment. 08-09-2021:  Knows how to handle high and low blood sugars. DM is stable at this time.   Denies any episodes of low blood sugars or high.       Reviewed scheduled/upcoming provider appointments including: 08-19-2021 at 1120 am    Advised patient, providing education and rationale, to check cbg as directed  and record. 08-09-2021: The patient monitors blood sugars on a regular basis. Denies any abnormal readings at this time. Will continue to monitor.         call provider for findings outside established parameters;       Review of patient status, including review of consultants reports, relevant laboratory and other test results, and medications completed;        Hyperlipidemia:  (Status: Goal on Track (progressing): YES.) Long Term Goal  Lab Results  Component Value Date   CHOL 101 04/04/2021   HDL  40 04/04/2021   LDLCALC 43 04/04/2021   TRIG 96 04/04/2021   CHOLHDL 2.6 12/21/2020     Medication review performed; medication list updated in electronic medical record. 08-09-2021: The patient is compliant with Crestor 5 mg QD. Denies any issues with medications at this time Provider established cholesterol goals reviewed 08-09-2021: Reviewed and praised the patient for being at goal. Counseled on importance of regular laboratory monitoring as prescribed. 08-09-2021: The patient has regular lab work done. Last in February of 2023. Provided HLD educational materials; Reviewed role and benefits of statin for ASCVD risk reduction; Discussed strategies to manage statin-induced myalgias; Reviewed importance of limiting foods high in cholesterol. 08-09-2021: The patient is at goal. No new issues related to following a heart healthy/ADA diet  Reviewed exercise goals and target of 150 minutes per week; Screening for signs and symptoms  of depression related to chronic disease state;  Assessed social determinant of health barriers;   Hypertension: (Status: Goal on track: NO.) Last practice recorded BP readings:  BP Readings from Last 3 Encounters:  08/05/21 110/67  08/04/21 106/70  06/20/21 (!) 158/82  Most recent eGFR/CrCl:  Lab Results  Component Value Date   EGFR 57 (L) 08/04/2021    No components found for: CRCL  Evaluation of current treatment plan related to hypertension self management and patient's adherence to plan as established by provider.  02-08-2021: The patient is concerned that his blood pressure is elevated first thing in the am around 170/90's in the am and after he sits down it will drop to 101/60. Denies pain or being anxious first thing in the am. Education on the goal of systolic <160 and diastolic <90. Also review of medications and if he is taking them correctly. The patient states compliance with bp medications. The patient may need to follow up with the cardiologist as he  ran out of his medications for his AFIB and he can tell his heart is out of rhythm. Education and support given. See AFIB plan of care for more details. 04-19-2021: The patient continues to do cardiac rehab and feels like he is doing well with heart healthy. HTN and blood pressure readings have stabilized. The patient denies any acute needs at this time. 06-21-2021: The patient was at the cardiology office on 06-20-2021 and was sent to the ER to be evaluated. The patient had elevated blood pressures but states his blood pressures have stabilized now. The patient states that he was not feeling the best and his heart rate has been tachycardia at times due to coming off of the Metoprolol. The patient states that they discontinued the Metoprolol since it was causing bradycardia. The patient states that he has been on it for years and never had any problems. Everything checked out well at the ER but the patient is going to be wearing a hollister monitor until Thursday to see if there are any abnormalities that are detected. The patient is frustrated with his health conditions with his heart, back and leg pain right now. Empathetic listening and support given. 08-09-2021: The patient was in the ED recently for Chest pain but everything checked out okay with his heart. The patient states that he is having elevated heart rate and it is really taking a toll on him. The patient thought he was back in Afib but is not. When his heart rate gets to 100 or more he really has a hard time. Has appointment with cardiologist this afternoon. His daughter is going with him. Education on writing down questions to ask the provider.  Provided education to patient re: stroke prevention, s/s of heart attack and stroke. 06-21-2021:: Review of elevations in blood pressure putting the patient at higher risk of heart attack and stroke. The patient had elevations at the ER on 06-20-2021. The patient was checked out and cleared of any issues related to HA  or stroke. Is wearing a hollister monitor currently until 06-23-2021. 08-09-2021: The patient was recently in the ER for Chest pain. The patient feels it is structural pain not related to his heart. Is following the plan of care. Sees cardiologist this afternoon.  Reviewed prescribed diet heart healthy/ADA diet. 08-09-2021 The patient states that he is compliant. Denies any issues with compliance or following dietary restrictions.   Reviewed medications with patient and discussed importance of compliance. 06-21-2021 States compliance with blood pressure  medications.  Has been discontinued off of the Metoprolol for now. Is working with the cardiologist. 08-09-2021: Review of medications and the patient is compliant with medications. Denies any new concerns with medications management Discussed plans with patient for ongoing care management follow up and provided patient with direct contact information for care management team; Advised patient, providing education and rationale, to monitor blood pressure daily and record, calling PCP for findings outside established parameters;  Advised patient to discuss recent hospitalization  with provider; Provided education on prescribed diet heart healthy diet. 08-09-2021: Review and education provided;  Discussed complications of poorly controlled blood pressure such as heart disease, stroke, circulatory complications, vision complications, kidney impairment, sexual dysfunction;  Evaluation of post discharge from the hospital due to a NSTEMI. The patient was hospitalized from 12-20-2020 to 12-23-2020. The patient underwent heart catheterization and had a stent placed. The patient states that he is doing well. The patient will follow up with the PA for cardiology today. The patient is concerned as he had went to the doctor about the chest pain he was having. Explained the risk for increased incidence of heart attack and stroke with the multiple chronic conditions the patient  has. Also discussed how lifestyle choices, hereditary factors and other things may contribute to higher risk. The patient is concerned over his children. Education and support given. 06-21-2021: The patient states he has had a time with regulation of his heart health. He was in the ER yesterday after the cardiology office sent him after he came to the office and appeared ill looking and the patients statement that he did not feel well. The patient was sent to the ER and evaluated. Everything checked out well and the patient will wear a hollister monitor for the next couple of days. The patient states he is having periods of tachycardia as much as 110 to 120 since the Metoprolol was discontinued. The patient can tell when his heart rate is elevated. The patient states he will wear the monitor and turn it in on Thursday and hopefully it will reveal what is going on with his heart. 08-09-2021: The patient continues to have issues post ER visit. Will follow up with the cardiologist today for recommendations and further treatment evaluations. The patient states his daughter is going with him to the cardiology visit today.    A-fib:  (Status: Goal on Track (progressing): YES.) Long Term Goal added 02-08-2021- had been stable. Patient ran out of medications, daughter has gone to pick up refill order now. 04-19-2021: The patient is stable with his AFIB at this time. No new issues related to AFIB noted. Will continue to monitor. 06-21-2021: The patient was evaluated in the ER yesterday and had an EKG. He was in a NSR with a rate of 78. No new evidence of Afib. Is wearing a hollister monitor until 06-23-2021 to evaluate changes in heart rate. 08-09-2021: The patient was evaluated in the ER recently. Thought he was back in AFIB but the provider states he was having pvc's. The patient states that when his heart rate gets over 100 he has a time. Sees cardiologist today. Will continue to monitor.  Counseled on increased risk of stroke  due to Afib and benefits of anticoagulation for stroke prevention           Reviewed importance of adherence to anticoagulant exactly as prescribed. 08-09-2021: States compliance with medications. Continues to take the Eliquis for control of Afib and Rythmol  Advised patient to discuss recently running  out of medications x 3 to 4 days and the patient heart being out of rhythm  with provider Counseled on bleeding risk associated with AFIB and importance of self-monitoring for signs/symptoms of bleeding Counseled on avoidance of NSAIDs due to increased bleeding risk with anticoagulants Counseled on importance of regular laboratory monitoring as prescribed. 06-21-2021: Has regular lab work Counseled on seeking medical attention after a head injury or if there is blood in the urine/stool Afib action plan reviewed. 02-08-2021: The patient was not feeling well today and states that he messed up and ran out of his medications for his AFIB. The patient states he has been out for 3 to 4 days. The patient states that his daughter has gone to pick up his refill now. The patient takes Rythmol 425 mg BID. Education given to reach out to the cardiologist for recommendations and treatment options. The patient states he hopes he feels better once starting the Rythmol back. He is taking cardiac rehab and advised the patient to discuss this with the rehab personnel as well. The patient does not want to go back to the hospital. The patient educated on what worse looks like and to seek emergent care for changes in heart health. Education on making sure he takes his medications exactly as prescribed. Will continue to monitor for changes. 06-21-2021: The patient was feeling better today than yesterday. Is concerned about the things that are going on with his heart health and pain level. Is having testing done. Knows to call for changes. 08-09-2021: The patient is frustrated and does not know what is going on with his heart. Everything  checked out good when he went to the ER. He states that he will discuss his concerns with the cardiologist today. Empathetic listening and support given.   Pain:  (Status: Goal on track: NO.) Long Term Goal  Pain assessment performed. 12-28-2020: The patient states that he is still having the right shin pain and it is very debilitating when it occurs. He plans to talk to pcp on upcoming visit. He currently rates it at a 10 when it occurs. His back pain is at a good level today. Rates at a 3/4 today on a scale of 0-10. States the stimulator seems to be on the right setting. 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 The patient denies any falls and is using his crutch for better stability. Empathetic listening and support given. He discuss his concerns with pcp at upcoming visit on 01-10-2021 at 340 pm. 02-08-2021: The patient states that he was sitting down at the time of the call and he has no pain in his right leg or back at this time. He does have pain at a 5 or 6 in his back when he is walking and sometimes up to a 10 in his right shin. He states he did have to change the settings on his stimulator yesterday and he doesn't know if it is because of the neuropathy in his back or what. He just overall does not feel well. Education given. States that his right shine pain is at a 1 to 2 today and also his back pain. Worse pain is the neuropathy  he is having and its not really pain but aching and can be intense. He thinks the combination of things is making it worse.  He saw a dermatologist for his right shin pain and was told there was nothing that could be done. She  is prescribing a medication for him to use as a topical application and they are sending this to him in mail but he is not optimistic that this will work. Education and support given. Despite his chronic pain, he remains optimistic. 06-21-2021: Saw a neurosurgeon on 06-10-2021 at the recommendations of  the nerve stimulator personnel. The patient is having increasing pain in back and weakness in his legs. The patient states he has been adjusting his stimulator and since Sunday the pain has moved from the right side to the left.  He is going Thursday to have an MRI, CT, and an Xray. The neurosurgeon is also order PT to help with gait training and strengthening. The patient says PT has not been successful in the past but he is willing to do what they want him to do to feel better. Denies any acute findings today. Pain is okay at this time when he is not active. When he is walking that is when it gets really bad. He is concerned over the weakness in his legs. 08-09-2021: The patient is rating his pain today at a 5 on a scale of 0-10. The patient states he has been having a lot of pain over the last several weeks and  he adjusted his stimulator yesterday and today his pain level is better. He says he knows this back pain is something he is going to have to live with for the rest of his life. Education and support given.  Medications reviewed. 08-09-2021:  Is compliant with his medications and has the pain stimulator as well. Has been working with the pain stimulator representative to adjust the stimulator as needed.  Reviewed provider established plan for pain management. 08-09-2021: Is working with the neurosurgeon currently to investigate the worsening back pain and leg weakness he has been experiencing for the last 6 to 8 months. ; Discussed importance of adherence to all scheduled medical appointments. Is compliant with appointments. Is considering going to the dermatologist to determine if they can help with the right shin pain Counseled on the importance of reporting any/all new or changed pain symptoms or management strategies to pain management provider; Advised patient to report to care team affect of pain on daily activities; Discussed use of relaxation techniques and/or diversional activities to assist  with pain reduction (distraction, imagery, relaxation, massage, acupressure, TENS, heat, and cold application; Reviewed with patient prescribed pharmacological and nonpharmacological pain relief strategies; Advised patient to discuss ongoing right shin pain that is getting worse with provider; 04-19-2021: The patient states that he is going to go Monday to see if he is going to have to get a sleep apnea device as he thinks he has sleep apnea. Will continue to monitor.    Patient Goals/Self-Care Activities: Patient will self administer medications as prescribed as evidenced by self report/primary caregiver report  Patient will attend all scheduled provider appointments as evidenced by clinician review of documented attendance to scheduled appointments and patient/caregiver report Patient will call pharmacy for medication refills as evidenced by patient report and review of pharmacy fill history as appropriate Patient will attend church or other social activities as evidenced by patient report Patient will continue to perform ADL's independently as evidenced by patient/caregiver report Patient will continue to perform IADL's independently as evidenced by patient/caregiver report Patient will call provider office for new concerns or questions as evidenced by review of documented incoming telephone call notes and patient report Patient will work with BSW to address care coordination needs and will  continue to work with the clinical team to address health care and disease management related needs as evidenced by documented adherence to scheduled care management/care coordination appointments schedule appointment with eye doctor check blood sugar at prescribed times: when you have symptoms of low or high blood sugar, before and after exercise, and as directed by provider   check feet daily for cuts, sores or redness enter blood sugar readings and medication or insulin into daily log take the blood sugar  log to all doctor visits trim toenails straight across drink 6 to 8 glasses of water each day eat fish at least once per week fill half of plate with vegetables limit fast food meals to no more than 1 per week manage portion size prepare main meal at home 3 to 5 days each week read food labels for fat, fiber, carbohydrates and portion size reduce red meat to 2 to 3 times a week do heel pump exercise 2 to 3 times each day keep feet up while sitting wash and dry feet carefully every day wear comfortable, cotton socks wear comfortable, well-fitting shoes - check blood pressure 3 times per week - choose a place to take my blood pressure (home, clinic or office, retail store) - write blood pressure results in a log or diary - learn about high blood pressure - keep a blood pressure log - take blood pressure log to all doctor appointments - call doctor for signs and symptoms of high blood pressure - develop an action plan for high blood pressure - keep all doctor appointments - take medications for blood pressure exactly as prescribed - report new symptoms to your doctor - eat more whole grains, fruits and vegetables, lean meats and healthy fats - call for medicine refill 2 or 3 days before it runs out - take all medications exactly as prescribed - call doctor with any symptoms you believe are related to your medicine - call doctor when you experience any new symptoms - go to all doctor appointments as scheduled - adhere to prescribed diet: Heart healthy/ADA       Plan:Telephone follow up appointment with care management team member scheduled for:  09-23-2021 at 0900 am  Alto Denver RN, MSN, CCM Community Care Coordinator [place]  [facility] Mobile: [phone]

## 2021-08-12 DIAGNOSIS — I10 Essential (primary) hypertension: Secondary | ICD-10-CM

## 2021-08-12 DIAGNOSIS — E1159 Type 2 diabetes mellitus with other circulatory complications: Secondary | ICD-10-CM

## 2021-08-12 DIAGNOSIS — Z7984 Long term (current) use of oral hypoglycemic drugs: Secondary | ICD-10-CM

## 2021-08-12 DIAGNOSIS — I4891 Unspecified atrial fibrillation: Secondary | ICD-10-CM

## 2021-08-12 DIAGNOSIS — E785 Hyperlipidemia, unspecified: Secondary | ICD-10-CM

## 2021-08-14 NOTE — Patient Instructions (Signed)
Fatigue If you have fatigue, you feel tired all the time and have a lack of energy or a lack of motivation. Fatigue may make it difficult to start or complete tasks because of exhaustion. Occasional or mild fatigue is often a normal response to activity or life. However, long-term (chronic) or extreme fatigue may be a symptom of a medical condition such as: Depression. Not having enough red blood cells or hemoglobin in the blood (anemia). A problem with a small gland located in the lower front part of the neck (thyroid disorder). Rheumatologic conditions. These are problems related to the body's defense system (immune system). Infections, especially certain viral infections. Fatigue can also lead to negative health outcomes over time. Follow these instructions at home: Medicines Take over-the-counter and prescription medicines only as told by your health care provider. Take a multivitamin if told by your health care provider. Do not use herbal or dietary supplements unless they are approved by your health care provider. Eating and drinking  Avoid heavy meals in the evening. Eat a well-balanced diet, which includes lean proteins, whole grains, plenty of fruits and vegetables, and low-fat dairy products. Avoid eating or drinking too many products with caffeine in them. Avoid alcohol. Drink enough fluid to keep your urine pale yellow. Activity  Exercise regularly, as told by your health care provider. Use or practice techniques to help you relax, such as yoga, tai chi, meditation, or massage therapy. Lifestyle Change situations that cause you stress. Try to keep your work and personal schedules in balance. Do not use recreational or illegal drugs. General instructions Monitor your fatigue for any changes. Go to bed and get up at the same time every day. Avoid fatigue by pacing yourself during the day and getting enough sleep at night. Maintain a healthy weight. Contact a health care  provider if: Your fatigue does not get better. You have a fever. You suddenly lose or gain weight. You have headaches. You have trouble falling asleep or sleeping through the night. You feel angry, guilty, anxious, or sad. You have swelling in your legs or another part of your body. Get help right away if: You feel confused, feel like you might faint, or faint. Your vision is blurry or you have a severe headache. You have severe pain in your abdomen, your back, or the area between your waist and hips (pelvis). You have chest pain, shortness of breath, or an irregular or fast heartbeat. You are unable to urinate, or you urinate less than normal. You have abnormal bleeding from the rectum, nose, lungs, nipples, or, if you are male, the vagina. You vomit blood. You have thoughts about hurting yourself or others. These symptoms may be an emergency. Get help right away. Call 911. Do not wait to see if the symptoms will go away. Do not drive yourself to the hospital. Get help right away if you feel like you may hurt yourself or others, or have thoughts about taking your own life. Go to your nearest emergency room or: Call 911. Call the [facility] at [phone] or 988. This is open 24 hours a day. Text the Crisis Text Line at 979-823-0385. Summary If you have fatigue, you feel tired all the time and have a lack of energy or a lack of motivation. Fatigue may make it difficult to start or complete tasks because of exhaustion. Long-term (chronic) or extreme fatigue may be a symptom of a medical condition. Exercise regularly, as told by your health care provider.  Change situations that cause you stress. Try to keep your work and personal schedules in balance. This information is not intended to replace advice given to you by your health care provider. Make sure you discuss any questions you have with your health care provider. Document Revised: 11/22/2020 Document  Reviewed: 11/22/2020 Elsevier Patient Education  2023 [employer] Inc.  

## 2021-08-19 ENCOUNTER — Ambulatory Visit (INDEPENDENT_AMBULATORY_CARE_PROVIDER_SITE_OTHER): Payer: Medicare Other | Admitting: Nurse Practitioner

## 2021-08-19 ENCOUNTER — Encounter: Payer: Self-pay | Admitting: Nurse Practitioner

## 2021-08-19 VITALS — BP 115/71 | HR 78 | Temp 97.8°F | Wt 182.0 lb

## 2021-08-19 DIAGNOSIS — R5383 Other fatigue: Secondary | ICD-10-CM

## 2021-08-19 DIAGNOSIS — R0602 Shortness of breath: Secondary | ICD-10-CM | POA: Diagnosis not present

## 2021-08-19 DIAGNOSIS — E039 Hypothyroidism, unspecified: Secondary | ICD-10-CM

## 2021-08-19 NOTE — Progress Notes (Signed)
BP 115/71   Pulse 78   Temp 97.8 F (36.6 C) (Oral)   Wt 182 lb (82.6 kg)   SpO2 97%   BMI 24.01 kg/m    Subjective:    Patient ID: Sean Hacker., male    DOB: January 26, 1941, 81 y.o.   MRN: [id number]  HPI: Sean Hamman. is a 81 y.o. male  Chief Complaint  Patient presents with   Fatigue    Pt reports he has not been feeling very well lately    FATIGUE Complains of ongoing SOB and fatigue, has seen cardiology and had ER visit since last office visit with PCP.    Last visit was with PCP 08/04/21 -- he requested referral for second opinion from alternate cardiologist, which was placed.  Last saw current cardiologist, Dr. Nehemiah Massed, on 08/09/21 and is now scheduled for left heart cardiac cath on 08/23/21. He is to continue Imdur for chest pain.  Reports walking in to office today made him short of breath and exhausted.  No orthopnea.  No history of smoking or second hand smoke.  Has CPAP, but awaiting new equipment.   - History of NSTEMI 12/20/20 -- during catheterization on 12/22/20 LVEF noted to be 45-50% + "Three-vessel coronary artery disease with 70% stenosis very distal segment LAD, diffuse 80% stenosis small caliber AV groove left circumflex, and high-grade 99% stenosis proximal RCA.  - Last echo was 12/21/20 noting EF 65-70% with normal left ventricle function.  Mild left ventricular hypertrophy.   - Successful PCI with DES proximal RCA".  Had stress testing on 01/11/21 "Normal LV systolic function with ejection fraction of 60% ". - Has seen hematology for anemia -- last visit 06/08/21 -- history of 4 iron infusions in past Duration:  months Severity: 10/10  Onset: gradual Context when symptoms started:  unknown Symptoms improve with rest: yes  Depressive symptoms: no Stress/anxiety: no Insomnia: no Snoring: yes Observed apnea by bed partner: no Daytime hypersomnolence:no Wakes feeling refreshed: yes History of sleep study: yes Dysnea on exertion:  yes Orthopnea/PND:  no Chest pain: no Chronic cough: no Lower extremity edema: no Arthralgias:yes Myalgias: no Weakness: yes Rash: no    Relevant past medical, surgical, family and social history reviewed and updated as indicated. Interim medical history since our last visit reviewed. Allergies and medications reviewed and updated.  Review of Systems  Constitutional:  Positive for fatigue. Negative for activity change, appetite change, diaphoresis and fever.  Respiratory:  Positive for shortness of breath. Negative for cough, chest tightness and wheezing.   Cardiovascular:  Negative for chest pain, palpitations and leg swelling.  Gastrointestinal: Negative.   Endocrine: Negative for cold intolerance, heat intolerance, polydipsia, polyphagia and polyuria.  Neurological: Negative.   Psychiatric/Behavioral: Negative.      Per HPI unless specifically indicated above     Objective:    BP 115/71   Pulse 78   Temp 97.8 F (36.6 C) (Oral)   Wt 182 lb (82.6 kg)   SpO2 97%   BMI 24.01 kg/m   Wt Readings from Last 3 Encounters:  08/19/21 182 lb (82.6 kg)  08/05/21 185 lb 3 oz (84 kg)  08/04/21 183 lb 6.4 oz (83.2 kg)    Physical Exam Vitals and nursing note reviewed.  Constitutional:      General: He is awake. He is not in acute distress.    Appearance: He is well-developed and well-groomed. He is not ill-appearing.  HENT:     Head: Normocephalic and atraumatic.  Right Ear: Hearing normal. No drainage.     Left Ear: Hearing normal. No drainage.     Mouth/Throat:     Mouth: Mucous membranes are moist.     Pharynx: Oropharynx is clear.     Comments: Mucus membranes pink. Eyes:     General: Lids are normal.        Right eye: No discharge.        Left eye: No discharge.     Conjunctiva/sclera: Conjunctivae normal.     Pupils: Pupils are equal, round, and reactive to light.  Neck:     Thyroid: No thyromegaly.     Vascular: No carotid bruit.     Trachea: Trachea normal.  Cardiovascular:      Rate and Rhythm: Normal rate and regular rhythm.     Heart sounds: Normal heart sounds, S1 normal and S2 normal. No murmur heard.    No gallop.  Pulmonary:     Effort: Pulmonary effort is normal. No accessory muscle usage or respiratory distress.     Breath sounds: Normal breath sounds.     Comments: Mild shortness of breath noted with walking and occasionally with talking. Abdominal:     General: Bowel sounds are normal.     Palpations: Abdomen is soft.  Musculoskeletal:        General: Normal range of motion.     Cervical back: Normal range of motion and neck supple.     Right lower leg: No edema.     Left lower leg: No edema.  Lymphadenopathy:     Cervical: No cervical adenopathy.  Skin:    General: Skin is warm and dry.     Capillary Refill: Capillary refill takes less than 2 seconds.  Neurological:     Mental Status: He is alert and oriented to person, place, and time.     Deep Tendon Reflexes:     Reflex Scores:      Patellar reflexes are 1+ on the right side and 1+ on the left side.      Achilles reflexes are 1+ on the right side and 1+ on the left side.    Comments: Antalgic gait, using cane.  Psychiatric:        Attention and Perception: Attention normal.        Mood and Affect: Mood normal.        Speech: Speech normal.        Behavior: Behavior normal. Behavior is cooperative.        Thought Content: Thought content normal.     Results for orders placed or performed during the hospital encounter of 16/10/96  Basic metabolic panel  Result Value Ref Range   Sodium 138 135 - 145 mmol/L   Potassium 4.3 3.5 - 5.1 mmol/L   Chloride 103 98 - 111 mmol/L   CO2 28 22 - 32 mmol/L   Glucose, Bld 138 (H) 70 - 99 mg/dL   BUN 21 8 - 23 mg/dL   Creatinine, Ser 1.30 (H) 0.61 - 1.24 mg/dL   Calcium 9.1 8.9 - 10.3 mg/dL   GFR, Estimated 56 (L) >60 mL/min   Anion gap 7 5 - 15  CBC  Result Value Ref Range   WBC 6.6 4.0 - 10.5 K/uL   RBC 3.63 (L) 4.22 - 5.81 MIL/uL    Hemoglobin 11.1 (L) 13.0 - 17.0 g/dL   HCT 33.5 (L) 39.0 - 52.0 %   MCV 92.3 80.0 - 100.0 fL   MCH 30.6  26.0 - 34.0 pg   MCHC 33.1 30.0 - 36.0 g/dL   RDW 13.0 11.5 - 15.5 %   Platelets 164 150 - 400 K/uL   nRBC 0.0 0.0 - 0.2 %  Troponin I (High Sensitivity)  Result Value Ref Range   Troponin I (High Sensitivity) 7 <18 ng/L  Troponin I (High Sensitivity)  Result Value Ref Range   Troponin I (High Sensitivity) 7 <18 ng/L      Assessment & Plan:   Problem List Items Addressed This Visit       Other   Fatigue - Primary    Ongoing issue with worsening post NSTEMI.  Recheck thyroid labs today.  Suspect multifactorial with main issue being need for new CPAP and neuropathy. Discussed at length with him today. He would like second opinion on heart health, which referral was placed for last visit.  Echo ordered last visit, has not obtained yet.  Could consider pulmonary referral in future if ongoing SBE.      Shortness of breath on exertion    Refer to fatigue plan of care.      Other Visit Diagnoses     Hypothyroidism, unspecified type       Thyroid labs today and initiate medication as needed.   Relevant Orders   T4, free   TSH        Follow up plan: Return for as scheduled in August.

## 2021-08-19 NOTE — Assessment & Plan Note (Signed)
Refer to fatigue plan of care.

## 2021-08-19 NOTE — Assessment & Plan Note (Signed)
Ongoing issue with worsening post NSTEMI.  Recheck thyroid labs today.  Suspect multifactorial with main issue being need for new CPAP and neuropathy. Discussed at length with him today. He would like second opinion on heart health, which referral was placed for last visit.  Echo ordered last visit, has not obtained yet.  Could consider pulmonary referral in future if ongoing SBE.

## 2021-08-20 LAB — T4, FREE: Free T4: 1.44 ng/dL (ref 0.82–1.77)

## 2021-08-20 LAB — TSH: TSH: 4.15 u[IU]/mL (ref 0.450–4.500)

## 2021-08-20 NOTE — Progress Notes (Signed)
Contacted via [facility] afternoon Sean Reyes, your thyroid labs have returned and are normal this check.  No medications needed at this time.  We will continue to monitor closely.  Hope the procedure goes well on Tuesday!!  Have a great weekend!! Keep being awesome!!  Thank you for allowing me to participate in your care.  I appreciate you. Kindest regards, Hayat Warbington

## 2021-08-23 ENCOUNTER — Ambulatory Visit
Admission: RE | Admit: 2021-08-23 | Discharge: 2021-08-23 | Disposition: A | Discharge status: Home / Self Care | Payer: Medicare Other | Attending: Internal Medicine | Admitting: Internal Medicine

## 2021-08-23 ENCOUNTER — Other Ambulatory Visit: Payer: Self-pay

## 2021-08-23 ENCOUNTER — Encounter: Payer: Self-pay | Admitting: Internal Medicine

## 2021-08-23 ENCOUNTER — Encounter
Admission: RE | Disposition: A | Discharge status: Home / Self Care | Payer: Self-pay | Source: Home / Self Care | Attending: Internal Medicine

## 2021-08-23 DIAGNOSIS — R531 Weakness: Secondary | ICD-10-CM | POA: Diagnosis not present

## 2021-08-23 DIAGNOSIS — R079 Chest pain, unspecified: Secondary | ICD-10-CM | POA: Diagnosis not present

## 2021-08-23 DIAGNOSIS — I25110 Atherosclerotic heart disease of native coronary artery with unstable angina pectoris: Secondary | ICD-10-CM | POA: Diagnosis not present

## 2021-08-23 DIAGNOSIS — I2510 Atherosclerotic heart disease of native coronary artery without angina pectoris: Secondary | ICD-10-CM | POA: Diagnosis not present

## 2021-08-23 DIAGNOSIS — R0602 Shortness of breath: Secondary | ICD-10-CM | POA: Diagnosis not present

## 2021-08-23 DIAGNOSIS — I4891 Unspecified atrial fibrillation: Secondary | ICD-10-CM | POA: Diagnosis not present

## 2021-08-23 DIAGNOSIS — I10 Essential (primary) hypertension: Secondary | ICD-10-CM | POA: Insufficient documentation

## 2021-08-23 DIAGNOSIS — Z955 Presence of coronary angioplasty implant and graft: Secondary | ICD-10-CM | POA: Diagnosis not present

## 2021-08-23 DIAGNOSIS — Z7901 Long term (current) use of anticoagulants: Secondary | ICD-10-CM | POA: Diagnosis not present

## 2021-08-23 HISTORY — PX: LEFT HEART CATH AND CORONARY ANGIOGRAPHY: CATH118249

## 2021-08-23 LAB — GLUCOSE, CAPILLARY: Glucose-Capillary: 138 mg/dL — ABNORMAL HIGH (ref 70–99)

## 2021-08-23 SURGERY — LEFT HEART CATH AND CORONARY ANGIOGRAPHY
Anesthesia: Moderate Sedation | Laterality: Left

## 2021-08-23 MED ORDER — ACETAMINOPHEN 325 MG PO TABS: 650.0000 mg | ORAL_TABLET | ORAL | Status: DC | PRN

## 2021-08-23 MED ORDER — HYDRALAZINE HCL 20 MG/ML IJ SOLN: 10.0000 mg | INTRAMUSCULAR | Status: DC | PRN

## 2021-08-23 MED ORDER — HEPARIN SODIUM (PORCINE) 1000 UNIT/ML IJ SOLN
INTRAMUSCULAR | Status: AC
  Filled 2021-08-23: qty 10

## 2021-08-23 MED ORDER — LIDOCAINE HCL 1 % IJ SOLN
INTRAMUSCULAR | Status: AC
  Filled 2021-08-23: qty 20

## 2021-08-23 MED ORDER — IOHEXOL 300 MG/ML  SOLN
INTRAMUSCULAR | Status: DC | PRN
  Administered 2021-08-23: 62 mL

## 2021-08-23 MED ORDER — SODIUM CHLORIDE 0.9 % IV SOLN: 250.0000 mL | INTRAVENOUS | Status: DC | PRN

## 2021-08-23 MED ORDER — ASPIRIN 81 MG PO CHEW
CHEWABLE_TABLET | ORAL | Status: AC
  Filled 2021-08-23: qty 1

## 2021-08-23 MED ORDER — FENTANYL CITRATE (PF) 100 MCG/2ML IJ SOLN
INTRAMUSCULAR | Status: DC | PRN
  Administered 2021-08-23: 50 ug via INTRAVENOUS

## 2021-08-23 MED ORDER — VERAPAMIL HCL 2.5 MG/ML IV SOLN
INTRAVENOUS | Status: AC
  Filled 2021-08-23: qty 2

## 2021-08-23 MED ORDER — ASPIRIN 81 MG PO CHEW
81.0000 mg | CHEWABLE_TABLET | ORAL | Status: AC
  Administered 2021-08-23: 81 mg via ORAL

## 2021-08-23 MED ORDER — HEPARIN (PORCINE) IN NACL 1000-0.9 UT/500ML-% IV SOLN
INTRAVENOUS | Status: AC
  Filled 2021-08-23: qty 1000

## 2021-08-23 MED ORDER — LIDOCAINE HCL (PF) 1 % IJ SOLN
INTRAMUSCULAR | Status: DC | PRN
  Administered 2021-08-23: 5 mL

## 2021-08-23 MED ORDER — VERAPAMIL HCL 2.5 MG/ML IV SOLN
INTRAVENOUS | Status: DC | PRN
  Administered 2021-08-23: 2.5 mg via INTRA_ARTERIAL

## 2021-08-23 MED ORDER — HEPARIN (PORCINE) IN NACL 1000-0.9 UT/500ML-% IV SOLN
INTRAVENOUS | Status: DC | PRN
  Administered 2021-08-23: 1000 mL

## 2021-08-23 MED ORDER — SODIUM CHLORIDE 0.9% FLUSH: 3.0000 mL | Freq: Two times a day (BID) | INTRAVENOUS | Status: DC

## 2021-08-23 MED ORDER — SODIUM CHLORIDE 0.9 % WEIGHT BASED INFUSION
3.0000 mL/kg/h | INTRAVENOUS | Status: AC
  Administered 2021-08-23: 3 mL/kg/h via INTRAVENOUS

## 2021-08-23 MED ORDER — SODIUM CHLORIDE 0.9 % WEIGHT BASED INFUSION: 1.0000 mL/kg/h | INTRAVENOUS | Status: DC

## 2021-08-23 MED ORDER — HEPARIN SODIUM (PORCINE) 1000 UNIT/ML IJ SOLN
INTRAMUSCULAR | Status: DC | PRN
  Administered 2021-08-23: 4000 [IU] via INTRAVENOUS

## 2021-08-23 MED ORDER — MIDAZOLAM HCL 2 MG/2ML IJ SOLN
INTRAMUSCULAR | Status: AC
  Filled 2021-08-23: qty 2

## 2021-08-23 MED ORDER — SODIUM CHLORIDE 0.9% FLUSH: 3.0000 mL | INTRAVENOUS | Status: DC | PRN

## 2021-08-23 MED ORDER — SODIUM CHLORIDE 0.9 % WEIGHT BASED INFUSION
1.0000 mL/kg/h | INTRAVENOUS | Status: DC
  Administered 2021-08-23: 1 mL/kg/h via INTRAVENOUS

## 2021-08-23 MED ORDER — LABETALOL HCL 5 MG/ML IV SOLN: 10.0000 mg | INTRAVENOUS | Status: DC | PRN

## 2021-08-23 MED ORDER — FENTANYL CITRATE (PF) 100 MCG/2ML IJ SOLN
INTRAMUSCULAR | Status: AC
  Filled 2021-08-23: qty 2

## 2021-08-23 MED ORDER — ONDANSETRON HCL 4 MG/2ML IJ SOLN: 4.0000 mg | Freq: Four times a day (QID) | INTRAMUSCULAR | Status: DC | PRN

## 2021-08-23 MED ORDER — MIDAZOLAM HCL 2 MG/2ML IJ SOLN
INTRAMUSCULAR | Status: DC | PRN
  Administered 2021-08-23: 1 mg via INTRAVENOUS

## 2021-08-23 SURGICAL SUPPLY — 11 items
BAND ZEPHYR COMPRESS 30 LONG (HEMOSTASIS) ×1 IMPLANT
CATH 5FR JL3.5 JR4 ANG PIG MP (CATHETERS) ×1 IMPLANT
CATH INFINITI 5FR JL4 (CATHETERS) ×1 IMPLANT
DRAPE BRACHIAL (DRAPES) ×1 IMPLANT
GLIDESHEATH SLEND SS 6F .021 (SHEATH) ×1 IMPLANT
GUIDEWIRE INQWIRE 1.5J.035X260 (WIRE) IMPLANT
INQWIRE 1.5J .035X260CM (WIRE) ×2
PACK CARDIAC CATH (CUSTOM PROCEDURE TRAY) ×2 IMPLANT
PROTECTION STATION PRESSURIZED (MISCELLANEOUS) ×2
SET ATX SIMPLICITY (MISCELLANEOUS) ×1 IMPLANT
STATION PROTECTION PRESSURIZED (MISCELLANEOUS) IMPLANT

## 2021-08-23 NOTE — Progress Notes (Signed)
Patient arrived for procedure  no orders entered for procedure dr. Nehemiah Massed contacted by gretchen delametter, rn orders received for cardiac catheterization at 10:47am patient in unit now for pre procedure

## 2021-08-24 ENCOUNTER — Encounter: Payer: Self-pay | Admitting: Internal Medicine

## 2021-08-25 DIAGNOSIS — N289 Disorder of kidney and ureter, unspecified: Secondary | ICD-10-CM | POA: Diagnosis not present

## 2021-08-29 ENCOUNTER — Ambulatory Visit (INDEPENDENT_AMBULATORY_CARE_PROVIDER_SITE_OTHER): Payer: Medicare Other

## 2021-08-29 ENCOUNTER — Other Ambulatory Visit: Payer: Self-pay | Admitting: Nurse Practitioner

## 2021-08-29 DIAGNOSIS — I10 Essential (primary) hypertension: Secondary | ICD-10-CM | POA: Diagnosis not present

## 2021-08-29 DIAGNOSIS — G4733 Obstructive sleep apnea (adult) (pediatric): Secondary | ICD-10-CM | POA: Diagnosis not present

## 2021-08-29 DIAGNOSIS — E1169 Type 2 diabetes mellitus with other specified complication: Secondary | ICD-10-CM

## 2021-08-29 DIAGNOSIS — E785 Hyperlipidemia, unspecified: Secondary | ICD-10-CM | POA: Diagnosis not present

## 2021-08-29 DIAGNOSIS — R0602 Shortness of breath: Secondary | ICD-10-CM | POA: Diagnosis not present

## 2021-08-29 DIAGNOSIS — I480 Paroxysmal atrial fibrillation: Secondary | ICD-10-CM | POA: Diagnosis not present

## 2021-08-29 DIAGNOSIS — E1143 Type 2 diabetes mellitus with diabetic autonomic (poly)neuropathy: Secondary | ICD-10-CM

## 2021-08-29 DIAGNOSIS — E782 Mixed hyperlipidemia: Secondary | ICD-10-CM | POA: Diagnosis not present

## 2021-08-29 DIAGNOSIS — I152 Hypertension secondary to endocrine disorders: Secondary | ICD-10-CM

## 2021-08-29 DIAGNOSIS — I252 Old myocardial infarction: Secondary | ICD-10-CM

## 2021-08-29 DIAGNOSIS — R5383 Other fatigue: Secondary | ICD-10-CM

## 2021-08-29 DIAGNOSIS — I25118 Atherosclerotic heart disease of native coronary artery with other forms of angina pectoris: Secondary | ICD-10-CM | POA: Diagnosis not present

## 2021-08-29 DIAGNOSIS — R001 Bradycardia, unspecified: Secondary | ICD-10-CM | POA: Diagnosis not present

## 2021-08-29 NOTE — Patient Instructions (Signed)
Visit Information  Thank you for taking time to visit with me today. Please don't hesitate to contact me if I can be of assistance to you before our next scheduled telephone appointment.  Following are the goals we discussed today:  Hyperlipidemia:  (Status: Goal on Track (progressing): YES.) Long Term Goal       Lab Results  Component Value Date    CHOL 101 04/04/2021    HDL 40 04/04/2021    LDLCALC 43 04/04/2021    TRIG 96 04/04/2021    CHOLHDL 2.6 12/21/2020      Medication review performed; medication list updated in electronic medical record. 08-29-2021: The patient is compliant with Crestor 5 mg QD. Denies any issues with medications at this time Provider established cholesterol goals reviewed 08-09-2021: Reviewed and praised the patient for being at goal. Counseled on importance of regular laboratory monitoring as prescribed. 08-09-2021: The patient has regular lab work done. Last in February of 2023. Provided HLD educational materials; Reviewed role and benefits of statin for ASCVD risk reduction; Discussed strategies to manage statin-induced myalgias; Reviewed importance of limiting foods high in cholesterol. 08-09-2021: The patient is at goal. No new issues related to following a heart healthy/ADA diet  Reviewed exercise goals and target of 150 minutes per week; Screening for signs and symptoms of depression related to chronic disease state;  Assessed social determinant of health barriers;    Hypertension: (Status: Goal on track: NO.) Last practice recorded BP readings:     BP Readings from Last 3 Encounters:  08/23/21 140/72  08/19/21 115/71  08/05/21 110/67  Most recent eGFR/CrCl:       Lab Results  Component Value Date    EGFR 57 (L) 08/04/2021    No components found for: CRCL   Evaluation of current treatment plan related to hypertension self management and patient's adherence to plan as established by provider.  02-08-2021: The patient is concerned that his blood  pressure is elevated first thing in the am around 170/90's in the am and after he sits down it will drop to 101/60. Denies pain or being anxious first thing in the am. Education on the goal of systolic <427 and diastolic <06. Also review of medications and if he is taking them correctly. The patient states compliance with bp medications. The patient may need to follow up with the cardiologist as he ran out of his medications for his AFIB and he can tell his heart is out of rhythm. Education and support given. See AFIB plan of care for more details. 04-19-2021: The patient continues to do cardiac rehab and feels like he is doing well with heart healthy. HTN and blood pressure readings have stabilized. The patient denies any acute needs at this time. 06-21-2021: The patient was at the cardiology office on 06-20-2021 and was sent to the ER to be evaluated. The patient had elevated blood pressures but states his blood pressures have stabilized now. The patient states that he was not feeling the best and his heart rate has been tachycardia at times due to coming off of the Metoprolol. The patient states that they discontinued the Metoprolol since it was causing bradycardia. The patient states that he has been on it for years and never had any problems. Everything checked out well at the ER but the patient is going to be wearing a hollister monitor until Thursday to see if there are any abnormalities that are detected. The patient is frustrated with his health conditions with his heart,  back and leg pain right now. Empathetic listening and support given. 08-09-2021: The patient was in the ED recently for Chest pain but everything checked out okay with his heart. The patient states that he is having elevated heart rate and it is really taking a toll on him. The patient thought he was back in Afib but is not. When his heart rate gets to 100 or more he really has a hard time. Has appointment with cardiologist this afternoon. His  daughter is going with him. Education on writing down questions to ask the provider. 08-29-2021: The patient is very frustrated with the way he feels right now and says something is just not right post HA and heart catheterization. The patient had a new cath last week and the cardiologist states that there is nothing else he can do. He has an appointment with [facility] coming up 09-26-2021 but feels he needs to go sooner. The patient states that he does not know what to do. Advised the patient to keep his ECHO appointment for next week and to go to the ER for new onset of chest pain or discomfort and to keep appointment with new card in August. Call made to the cardiologist office and the patient is on the wait list to be called sooner if an appointment opens up.  Provided education to patient re: stroke prevention, s/s of heart attack and stroke. 06-21-2021:: Review of elevations in blood pressure putting the patient at higher risk of heart attack and stroke. The patient had elevations at the ER on 06-20-2021. The patient was checked out and cleared of any issues related to HA or stroke. Is wearing a hollister monitor currently until 06-23-2021. 08-09-2021: The patient was recently in the ER for Chest pain. The patient feels it is structural pain not related to his heart. Is following the plan of care. Sees cardiologist this afternoon. 08-29-2021: The patient denies chest pain but states he is having a difficult time. He has started writing down when he is having low heart rate, or any episodes he is having. Education and support given.  Reviewed prescribed diet heart healthy/ADA diet. 08-09-2021 The patient states that he is compliant. Denies any issues with compliance or following dietary restrictions.   Reviewed medications with patient and discussed importance of compliance. 06-21-2021 States compliance with blood pressure medications.  Has been discontinued off of the Metoprolol for now. Is working with  the cardiologist. 08-29-2021: Review of medications and the patient is compliant with medications. Denies any new concerns with medications management Discussed plans with patient for ongoing care management follow up and provided patient with direct contact information for care management team; Advised patient, providing education and rationale, to monitor blood pressure daily and record, calling PCP for findings outside established parameters;  Advised patient to discuss recent hospitalization  with provider; Provided education on prescribed diet heart healthy diet. 08-09-2021: Review and education provided;  Discussed complications of poorly controlled blood pressure such as heart disease, stroke, circulatory complications, vision complications, kidney impairment, sexual dysfunction;  Evaluation of post discharge from the hospital due to a NSTEMI. The patient was hospitalized from 12-20-2020 to 12-23-2020. The patient underwent heart catheterization and had a stent placed. The patient states that he is doing well. The patient will follow up with the PA for cardiology today. The patient is concerned as he had went to the doctor about the chest pain he was having. Explained the risk for increased incidence of heart attack and  stroke with the multiple chronic conditions the patient has. Also discussed how lifestyle choices, hereditary factors and other things may contribute to higher risk. The patient is concerned over his children. Education and support given. 06-21-2021: The patient states he has had a time with regulation of his heart health. He was in the ER yesterday after the cardiology office sent him after he came to the office and appeared ill looking and the patients statement that he did not feel well. The patient was sent to the ER and evaluated. Everything checked out well and the patient will wear a hollister monitor for the next couple of days. The patient states he is having periods of tachycardia  as much as 110 to 120 since the Metoprolol was discontinued. The patient can tell when his heart rate is elevated. The patient states he will wear the monitor and turn it in on Thursday and hopefully it will reveal what is going on with his heart. 08-09-2021: The patient continues to have issues post ER visit. Will follow up with the cardiologist today for recommendations and further treatment evaluations. The patient states his daughter is going with him to the cardiology visit today. 08-29-2021: Had a new heart cath last week. Education and review of the report given to the patient the patient educated on what EF% means. The patient is concerned because he is having very low heart rates and does not feel well at all.     A-fib:  (Status: Goal on Track (progressing): YES.) Long Term Goal added 02-08-2021- had been stable. Patient ran out of medications, daughter has gone to pick up refill order now. 04-19-2021: The patient is stable with his AFIB at this time. No new issues related to AFIB noted. Will continue to monitor. 06-21-2021: The patient was evaluated in the ER yesterday and had an EKG. He was in a NSR with a rate of 78. No new evidence of Afib. Is wearing a hollister monitor until 06-23-2021 to evaluate changes in heart rate. 08-09-2021: The patient was evaluated in the ER recently. Thought he was back in AFIB but the provider states he was having pvc's. The patient states that when his heart rate gets over 100 he has a time. Sees cardiologist today. Will continue to monitor. 08-29-2021: The patient is having low heart rates at this time. The patient states he does not feel well. Saw cardiologist today and was not pleased with the outcome. Education and support given.  Counseled on increased risk of stroke due to Afib and benefits of anticoagulation for stroke prevention           Reviewed importance of adherence to anticoagulant exactly as prescribed. 08-09-2021: States compliance with medications. Continues  to take the Eliquis for control of Afib and Rythmol  Advised patient to discuss recently running out of medications x 3 to 4 days and the patient heart being out of rhythm  with provider Counseled on bleeding risk associated with AFIB and importance of self-monitoring for signs/symptoms of bleeding Counseled on avoidance of NSAIDs due to increased bleeding risk with anticoagulants Counseled on importance of regular laboratory monitoring as prescribed. 06-21-2021: Has regular lab work Counseled on seeking medical attention after a head injury or if there is blood in the urine/stool Afib action plan reviewed. 02-08-2021: The patient was not feeling well today and states that he messed up and ran out of his medications for his AFIB. The patient states he has been out for 3 to 4 days. The patient  states that his daughter has gone to pick up his refill now. The patient takes Rythmol 425 mg BID. Education given to reach out to the cardiologist for recommendations and treatment options. The patient states he hopes he feels better once starting the Rythmol back. He is taking cardiac rehab and advised the patient to discuss this with the rehab personnel as well. The patient does not want to go back to the hospital. The patient educated on what worse looks like and to seek emergent care for changes in heart health. Education on making sure he takes his medications exactly as prescribed. Will continue to monitor for changes. 06-21-2021: The patient was feeling better today than yesterday. Is concerned about the things that are going on with his heart health and pain level. Is having testing done. Knows to call for changes. 08-09-2021: The patient is frustrated and does not know what is going on with his heart. Everything checked out good when he went to the ER. He states that he will discuss his concerns with the cardiologist today. Empathetic listening and support given. Extensive review of what worse looks like.   Collaboration with pcp for new recommendations and pcp agrees with instructions provided by the [facility]. Also collaborated with Dr. Rockey Situ office and ask the staff to place patient on the wait list and called for an earlier appointment if possible. Ask the patient to write down questions to ask the new cardiologist at his appointment.     Our next appointment is by telephone on 09-23-2021   Please call the care guide team at [phone] if you need to cancel or reschedule your appointment.   If you are experiencing a Mental Health or [place] or need someone to talk to, please call the Suicide and Crisis Lifeline: 988 call the [place] National Suicide Prevention Lifeline: 661-838-0824 or TTY: 954-515-9641 TTY 712-207-9483) to talk to a trained counselor call 1-800-273-TALK (toll free, 24 hour hotline)   Patient verbalizes understanding of instructions and care plan provided today and agrees to view in [place]. Active MyChart status and patient understanding of how to access instructions and care plan via MyChart confirmed with patient.     Noreene Larsson RN, MSN, [facility] Mobile: [phone]

## 2021-08-29 NOTE — Chronic Care Management (AMB) (Signed)
Chronic Care Management   CCM RN Visit Note  08/29/2021 Name: Sean Reyes. MRN: [id number] DOB: 05-03-1940  Subjective: Sean Reyes. is a 81 y.o. year old male who is a primary care patient of [name], [name], NP. The care management team was consulted for assistance with disease management and care coordination needs.    Engaged with patient by telephone for follow up visit in response to provider referral for case management and/or care coordination services.   Consent to Services:  The patient was given information about Chronic Care Management services, agreed to services, and gave verbal consent prior to initiation of services.  Please see initial visit note for detailed documentation.   Patient agreed to services and verbal consent obtained.   Assessment: Review of patient past medical history, allergies, medications, health status, including review of consultants reports, laboratory and other test data, was performed as part of comprehensive evaluation and provision of chronic care management services.   SDOH (Social Determinants of Health) assessments and interventions performed:    CCM Care Plan  Allergies  Allergen Reactions   Levaquin [Levofloxacin In D5w] Anaphylaxis and Shortness Of Breath   Shellfish Allergy Anaphylaxis    Has used duraprep, betadine and ioban in previous surgeries in 2019 and 2018 without issue   Amiodarone Other (See Comments)    Tremors and thyroid toxicity   Adhesive [Tape] Other (See Comments)    Little red bumps under the dressing.  He questions whether is latex related    Outpatient Encounter Medications as of 08/29/2021  Medication Sig Note   acetaminophen (TYLENOL) 500 MG tablet Take 500 mg by mouth every 6 (six) hours as needed (pain).    clopidogrel (PLAVIX) 75 MG tablet Take 75 mg by mouth daily.    cyanocobalamin 1000 MCG tablet Take by mouth.    ELIQUIS 5 MG TABS tablet Take 1 tablet (5 mg total) by mouth 2 (two) times daily.     finasteride (PROSCAR) 5 MG tablet Take 1 tablet (5 mg total) by mouth daily.    isosorbide mononitrate (IMDUR) 30 MG 24 hr tablet Take 30 mg by mouth daily.    metFORMIN (GLUCOPHAGE) 500 MG tablet Take 1 tablet (500 mg total) by mouth 2 (two) times daily with a meal.    omeprazole (PRILOSEC) 40 MG capsule Take 40 mg by mouth daily.    pregabalin (LYRICA) 25 MG capsule Take 1 capsule (25 mg total) by mouth 2 (two) times daily.    propafenone (RYTHMOL SR) 425 MG 12 hr capsule Take 425 mg by mouth 2 (two) times daily. Has been out for 3 days    rosuvastatin (CRESTOR) 5 MG tablet Take 1 tablet (5 mg total) by mouth daily.    solifenacin (VESICARE) 5 MG tablet Take 5 mg by mouth daily.    tamsulosin (FLOMAX) 0.4 MG CAPS capsule Take 0.8 mg by mouth daily.    traMADol (ULTRAM) 50 MG tablet Take 50 mg by mouth 2 (two) times daily as needed. 08/23/2021: Patient states, "have not taken in past week or month."    No facility-administered encounter medications on file as of 08/29/2021.    Patient Active Problem List   Diagnosis Date Noted   Shortness of breath on exertion 08/04/2021   Bradycardia 06/15/2021   Diabetes mellitus with proteinuria ([place]) 04/04/2021   GERD without esophagitis 04/02/2021   Coronary artery disease 12/28/2020   History of non-ST elevation myocardial infarction (NSTEMI) 12/21/2020   Pain in right shin 10/25/2020  Peripheral vascular disease ([place]) 08/06/2020   Chronic pain syndrome 07/06/2020   Spinal cord stimulator status 12/11/2019   History of 2019 novel coronavirus disease (COVID-19) 10/02/2019   Fatigue 07/30/2019   CKD (chronic kidney disease) stage 3, GFR 30-59 ml/min (HCC) 01/19/2019   Acquired thrombophilia ([place]) 01/19/2019   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   Postlaminectomy syndrome, lumbar region 01/16/2019   History of fusion of lumbar spine (L2-L5) 01/16/2019   Chronic radicular lumbar pain 01/16/2019   HNP (herniated nucleus pulposus), lumbar  04/29/2018   Advanced care planning/counseling discussion 09/28/2016   Spinal stenosis, lumbar region, 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/13/2016   Hyperlipidemia associated with type 2 diabetes mellitus ([place]) 07/14/2015   Symptomatic anemia 06/30/2015   Benign prostatic hyperplasia without lower urinary tract symptoms 06/02/2015   OSA (obstructive sleep apnea) 03/23/2015   Hypertension associated with diabetes ([place]) 09/28/2014   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[place]) 09/28/2014   H/O prior ablation treatment 10/19/2011   AF (paroxysmal atrial fibrillation) ([place]) 10/19/2011    Conditions to be addressed/monitored:Atrial Fibrillation, HTN, HLD, and HA with heart concerns  Care Plan : RNCM: General Plan of Care (Adult) for Chronic Disease Management and Care Coordination Needs  Updates made by Vanita Ingles, RN since 08/29/2021 12:00 AM     Problem: RNCM: Development of Plan of Care for Chronic Disease Management (DM, HTN, HLD, AFIB,Chronic Pain, Recent HA)   Priority: High     Long-Range Goal: RNCM: Effective Management  of Plan of Care for Chronic Disease Management (DM, HTN, HLD, AFIB,  Chronic Pain, Recent HA)   Start Date: 12/28/2020  Expected End Date: 12/28/2021  Priority: High  Note:   Current Barriers:  Knowledge Deficits related to plan of care for management of HTN, HLD, AFIB, DMII, and Chronic pain   Chronic Disease Management support and education needs related to HTN, HLD, AFIB, DMII, and Chronic pain   RNCM Clinical Goal(s):  Patient will verbalize understanding of plan for management of HTN, HLD, AFIB, DMII, and Chronic pain as evidenced by compliance with plan of care, calling the office for changes, and working with the CCM team to effectively manage health and well being  take all medications exactly as prescribed and will call provider for medication related questions as evidenced by compliance with medications and calling for refills before running out     attend all scheduled medical appointments: with specialist and pcp,  as evidenced by keeping appointments and calling for rescheduling needs         demonstrate improved and ongoing health management independence as evidenced by working with the CCM team to effectively manage health and well being         demonstrate a decrease in HTN, HLD, AFIB, DMII, and chronic pain  exacerbations  as evidenced by stable conditions, no acute changes, and working with the CCM team to effectively manage health and well being  demonstrate ongoing self health care management ability for effective management of chronic condtions  as evidenced by working with the CCM team  through collaboration with Consulting civil engineer, provider, and care team.   Interventions: 1:1 collaboration with primary care provider regarding development and update of comprehensive plan of care as evidenced by provider attestation and co-signature Inter-disciplinary care team collaboration (see longitudinal plan of care) Evaluation of current treatment plan related to  self management and patient's adherence to plan as established by provider   SDOH Barriers (Status: Goal on Track (progressing): YES.) Long Term Goal  Patient interviewed and SDOH assessment performed        Patient interviewed and appropriate assessments performed Provided patient with information about resources available in the community and care guides to assist with changes in SDOH, or new needs  Discussed plans with patient for ongoing care management follow up and provided patient with direct contact information for care management team Advised patient to call the office for changes in SDOH, questions, or concerns    Diabetes:  (Status: Condition stable. Not addressed this visit.) Long Term Goal   Lab Results  Component Value Date   HGBA1C 6.3 (H) 06/17/2021  Previous 6.6 Assessed patient's understanding of A1c goal: <7%. 08-09-2021: The patient is maintaining his A1C  below 7. Knows the goal of therapy. Praised for good control.  Provided education to patient about basic DM disease process. 08-09-2021: The patient knows how to effectively manage his DM. Saw the pcp on 06-17-2021 and his DM is stable. Will continue to monitor; Reviewed medications with patient and discussed importance of medication adherence. 08-09-2021: The patient states compliance with medications        Reviewed prescribed diet with patient Heart Healthy/ADA diet. 08-09-2021: The patient is compliant with heart healthy/ADA diet- reviewed and the patient is compliant with his dietary restrictions.  Counseled on importance of regular laboratory monitoring as prescribed. 12-27-  2022: The patient has regular lab work. Review of November labs.  08-09-2021: The patient has regular lab work and is compliant with the plan of care for his DM. Last labs on 06-17-2021 for routine care and follow up.     Discussed plans with patient for ongoing care management follow up and provided patient with direct contact information for care management team;      Provided patient with written educational materials related to hypo and hyperglycemia and importance of correct treatment. 08-09-2021:  Knows how to handle high and low blood sugars. DM is stable at this time.   Denies any episodes of low blood sugars or high.       Reviewed scheduled/upcoming provider appointments including: 08-19-2021 at 1120 am    Advised patient, providing education and rationale, to check cbg as directed  and record. 08-09-2021: The patient monitors blood sugars on a regular basis. Denies any abnormal readings at this time. Will continue to monitor.         call provider for findings outside established parameters;       Review of patient status, including review of consultants reports, relevant laboratory and other test results, and medications completed;        Hyperlipidemia:  (Status: Goal on Track (progressing): YES.) Long Term Goal  Lab Results   Component Value Date   CHOL 101 04/04/2021   HDL 40 04/04/2021   LDLCALC 43 04/04/2021   TRIG 96 04/04/2021   CHOLHDL 2.6 12/21/2020     Medication review performed; medication list updated in electronic medical record. 08-29-2021: The patient is compliant with Crestor 5 mg QD. Denies any issues with medications at this time Provider established cholesterol goals reviewed 08-09-2021: Reviewed and praised the patient for being at goal. Counseled on importance of regular laboratory monitoring as prescribed. 08-09-2021: The patient has regular lab work done. Last in February of 2023. Provided HLD educational materials; Reviewed role and benefits of statin for ASCVD risk reduction; Discussed strategies to manage statin-induced myalgias; Reviewed importance of limiting foods high in cholesterol. 08-09-2021: The patient is at goal. No new issues related to following a heart healthy/ADA  diet  Reviewed exercise goals and target of 150 minutes per week; Screening for signs and symptoms of depression related to chronic disease state;  Assessed social determinant of health barriers;   Hypertension: (Status: Goal on track: NO.) Last practice recorded BP readings:  BP Readings from Last 3 Encounters:  08/23/21 140/72  08/19/21 115/71  08/05/21 110/67  Most recent eGFR/CrCl:  Lab Results  Component Value Date   EGFR 57 (L) 08/04/2021    No components found for: CRCL  Evaluation of current treatment plan related to hypertension self management and patient's adherence to plan as established by provider.  02-08-2021: The patient is concerned that his blood pressure is elevated first thing in the am around 170/90's in the am and after he sits down it will drop to 101/60. Denies pain or being anxious first thing in the am. Education on the goal of systolic <283 and diastolic <15. Also review of medications and if he is taking them correctly. The patient states compliance with bp medications. The patient  may need to follow up with the cardiologist as he ran out of his medications for his AFIB and he can tell his heart is out of rhythm. Education and support given. See AFIB plan of care for more details. 04-19-2021: The patient continues to do cardiac rehab and feels like he is doing well with heart healthy. HTN and blood pressure readings have stabilized. The patient denies any acute needs at this time. 06-21-2021: The patient was at the cardiology office on 06-20-2021 and was sent to the ER to be evaluated. The patient had elevated blood pressures but states his blood pressures have stabilized now. The patient states that he was not feeling the best and his heart rate has been tachycardia at times due to coming off of the Metoprolol. The patient states that they discontinued the Metoprolol since it was causing bradycardia. The patient states that he has been on it for years and never had any problems. Everything checked out well at the ER but the patient is going to be wearing a hollister monitor until Thursday to see if there are any abnormalities that are detected. The patient is frustrated with his health conditions with his heart, back and leg pain right now. Empathetic listening and support given. 08-09-2021: The patient was in the ED recently for Chest pain but everything checked out okay with his heart. The patient states that he is having elevated heart rate and it is really taking a toll on him. The patient thought he was back in Afib but is not. When his heart rate gets to 100 or more he really has a hard time. Has appointment with cardiologist this afternoon. His daughter is going with him. Education on writing down questions to ask the provider. 08-29-2021: The patient is very frustrated with the way he feels right now and says something is just not right post HA and heart catheterization. The patient had a new cath last week and the cardiologist states that there is nothing else he can do. He has an  appointment with [facility] coming up 09-26-2021 but feels he needs to go sooner. The patient states that he does not know what to do. Advised the patient to keep his ECHO appointment for next week and to go to the ER for new onset of chest pain or discomfort and to keep appointment with new card in August. Call made to the cardiologist office and the patient is on the wait  list to be called sooner if an appointment opens up.  Provided education to patient re: stroke prevention, s/s of heart attack and stroke. 06-21-2021:: Review of elevations in blood pressure putting the patient at higher risk of heart attack and stroke. The patient had elevations at the ER on 06-20-2021. The patient was checked out and cleared of any issues related to HA or stroke. Is wearing a hollister monitor currently until 06-23-2021. 08-09-2021: The patient was recently in the ER for Chest pain. The patient feels it is structural pain not related to his heart. Is following the plan of care. Sees cardiologist this afternoon. 08-29-2021: The patient denies chest pain but states he is having a difficult time. He has started writing down when he is having low heart rate, or any episodes he is having. Education and support given.  Reviewed prescribed diet heart healthy/ADA diet. 08-09-2021 The patient states that he is compliant. Denies any issues with compliance or following dietary restrictions.   Reviewed medications with patient and discussed importance of compliance. 06-21-2021 States compliance with blood pressure medications.  Has been discontinued off of the Metoprolol for now. Is working with the cardiologist. 08-29-2021: Review of medications and the patient is compliant with medications. Denies any new concerns with medications management Discussed plans with patient for ongoing care management follow up and provided patient with direct contact information for care management team; Advised patient, providing education and  rationale, to monitor blood pressure daily and record, calling PCP for findings outside established parameters;  Advised patient to discuss recent hospitalization  with provider; Provided education on prescribed diet heart healthy diet. 08-09-2021: Review and education provided;  Discussed complications of poorly controlled blood pressure such as heart disease, stroke, circulatory complications, vision complications, kidney impairment, sexual dysfunction;  Evaluation of post discharge from the hospital due to a NSTEMI. The patient was hospitalized from 12-20-2020 to 12-23-2020. The patient underwent heart catheterization and had a stent placed. The patient states that he is doing well. The patient will follow up with the PA for cardiology today. The patient is concerned as he had went to the doctor about the chest pain he was having. Explained the risk for increased incidence of heart attack and stroke with the multiple chronic conditions the patient has. Also discussed how lifestyle choices, hereditary factors and other things may contribute to higher risk. The patient is concerned over his children. Education and support given. 06-21-2021: The patient states he has had a time with regulation of his heart health. He was in the ER yesterday after the cardiology office sent him after he came to the office and appeared ill looking and the patients statement that he did not feel well. The patient was sent to the ER and evaluated. Everything checked out well and the patient will wear a hollister monitor for the next couple of days. The patient states he is having periods of tachycardia as much as 110 to 120 since the Metoprolol was discontinued. The patient can tell when his heart rate is elevated. The patient states he will wear the monitor and turn it in on Thursday and hopefully it will reveal what is going on with his heart. 08-09-2021: The patient continues to have issues post ER visit. Will follow up with the  cardiologist today for recommendations and further treatment evaluations. The patient states his daughter is going with him to the cardiology visit today. 08-29-2021: Had a new heart cath last week. Education and review of the report given to  the patient the patient educated on what EF% means. The patient is concerned because he is having very low heart rates and does not feel well at all.   A-fib:  (Status: Goal on Track (progressing): YES.) Long Term Goal added 02-08-2021- had been stable. Patient ran out of medications, daughter has gone to pick up refill order now. 04-19-2021: The patient is stable with his AFIB at this time. No new issues related to AFIB noted. Will continue to monitor. 06-21-2021: The patient was evaluated in the ER yesterday and had an EKG. He was in a NSR with a rate of 78. No new evidence of Afib. Is wearing a hollister monitor until 06-23-2021 to evaluate changes in heart rate. 08-09-2021: The patient was evaluated in the ER recently. Thought he was back in AFIB but the provider states he was having pvc's. The patient states that when his heart rate gets over 100 he has a time. Sees cardiologist today. Will continue to monitor. 08-29-2021: The patient is having low heart rates at this time. The patient states he does not feel well. Saw cardiologist today and was not pleased with the outcome. Education and support given.  Counseled on increased risk of stroke due to Afib and benefits of anticoagulation for stroke prevention           Reviewed importance of adherence to anticoagulant exactly as prescribed. 08-09-2021: States compliance with medications. Continues to take the Eliquis for control of Afib and Rythmol  Advised patient to discuss recently running out of medications x 3 to 4 days and the patient heart being out of rhythm  with provider Counseled on bleeding risk associated with AFIB and importance of self-monitoring for signs/symptoms of bleeding Counseled on avoidance of NSAIDs  due to increased bleeding risk with anticoagulants Counseled on importance of regular laboratory monitoring as prescribed. 06-21-2021: Has regular lab work Counseled on seeking medical attention after a head injury or if there is blood in the urine/stool Afib action plan reviewed. 02-08-2021: The patient was not feeling well today and states that he messed up and ran out of his medications for his AFIB. The patient states he has been out for 3 to 4 days. The patient states that his daughter has gone to pick up his refill now. The patient takes Rythmol 425 mg BID. Education given to reach out to the cardiologist for recommendations and treatment options. The patient states he hopes he feels better once starting the Rythmol back. He is taking cardiac rehab and advised the patient to discuss this with the rehab personnel as well. The patient does not want to go back to the hospital. The patient educated on what worse looks like and to seek emergent care for changes in heart health. Education on making sure he takes his medications exactly as prescribed. Will continue to monitor for changes. 06-21-2021: The patient was feeling better today than yesterday. Is concerned about the things that are going on with his heart health and pain level. Is having testing done. Knows to call for changes. 08-09-2021: The patient is frustrated and does not know what is going on with his heart. Everything checked out good when he went to the ER. He states that he will discuss his concerns with the cardiologist today. Empathetic listening and support given. Extensive review of what worse looks like.  Collaboration with pcp for new recommendations and pcp agrees with instructions provided by the [facility]. Also collaborated with Dr. Rockey Situ office and ask the staff to place  patient on the wait list and called for an earlier appointment if possible. Ask the patient to write down questions to ask the new cardiologist at his appointment.   Pain:   (Status: Goal on track: NO.) Long Term Goal  Pain assessment performed. 12-28-2020: The patient states that he is still having the right shin pain and it is very debilitating when it occurs. He plans to talk to pcp on upcoming visit. He currently rates it at a 10 when it occurs. His back pain is at a good level today. Rates at a 3/4 today on a scale of 0-10. States the stimulator seems to be on the right setting. 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 The patient denies any falls and is using his crutch for better stability. Empathetic listening and support given. He discuss his concerns with pcp at upcoming visit on 01-10-2021 at 340 pm. 02-08-2021: The patient states that he was sitting down at the time of the call and he has no pain in his right leg or back at this time. He does have pain at a 5 or 6 in his back when he is walking and sometimes up to a 10 in his right shin. He states he did have to change the settings on his stimulator yesterday and he doesn't know if it is because of the neuropathy in his back or what. He just overall does not feel well. Education given. States that his right shine pain is at a 1 to 2 today and also his back pain. Worse pain is the neuropathy  he is having and its not really pain but aching and can be intense. He thinks the combination of things is making it worse.  He saw a dermatologist for his right shin pain and was told there was nothing that could be done. She is prescribing a medication for him to use as a topical application and they are sending this to him in mail but he is not optimistic that this will work. Education and support given. Despite his chronic pain, he remains optimistic. 06-21-2021: Saw a neurosurgeon on 06-10-2021 at the recommendations of the nerve stimulator personnel. The patient is having increasing pain in back and weakness in his legs. The patient states he has been adjusting his stimulator and  since Sunday the pain has moved from the right side to the left.  He is going Thursday to have an MRI, CT, and an Xray. The neurosurgeon is also order PT to help with gait training and strengthening. The patient says PT has not been successful in the past but he is willing to do what they want him to do to feel better. Denies any acute findings today. Pain is okay at this time when he is not active. When he is walking that is when it gets really bad. He is concerned over the weakness in his legs. 08-09-2021: The patient is rating his pain today at a 5 on a scale of 0-10. The patient states he has been having a lot of pain over the last several weeks and  he adjusted his stimulator yesterday and today his pain level is better. He says he knows this back pain is something he is going to have to live with for the rest of his life. Education and support given.  Medications reviewed. 08-09-2021:  Is compliant with his medications and has the pain stimulator as well. Has been working with the pain  stimulator representative to adjust the stimulator as needed.  Reviewed provider established plan for pain management. 08-09-2021: Is working with the neurosurgeon currently to investigate the worsening back pain and leg weakness he has been experiencing for the last 6 to 8 months. ; Discussed importance of adherence to all scheduled medical appointments. Is compliant with appointments. Is considering going to the dermatologist to determine if they can help with the right shin pain Counseled on the importance of reporting any/all new or changed pain symptoms or management strategies to pain management provider; Advised patient to report to care team affect of pain on daily activities; Discussed use of relaxation techniques and/or diversional activities to assist with pain reduction (distraction, imagery, relaxation, massage, acupressure, TENS, heat, and cold application; Reviewed with patient prescribed pharmacological and  nonpharmacological pain relief strategies; Advised patient to discuss ongoing right shin pain that is getting worse with provider; 04-19-2021: The patient states that he is going to go Monday to see if he is going to have to get a sleep apnea device as he thinks he has sleep apnea. Will continue to monitor.    Patient Goals/Self-Care Activities: Patient will self administer medications as prescribed as evidenced by self report/primary caregiver report  Patient will attend all scheduled provider appointments as evidenced by clinician review of documented attendance to scheduled appointments and patient/caregiver report Patient will call pharmacy for medication refills as evidenced by patient report and review of pharmacy fill history as appropriate Patient will attend church or other social activities as evidenced by patient report Patient will continue to perform ADL's independently as evidenced by patient/caregiver report Patient will continue to perform IADL's independently as evidenced by patient/caregiver report Patient will call provider office for new concerns or questions as evidenced by review of documented incoming telephone call notes and patient report Patient will work with BSW to address care coordination needs and will continue to work with the clinical team to address health care and disease management related needs as evidenced by documented adherence to scheduled care management/care coordination appointments schedule appointment with eye doctor check blood sugar at prescribed times: when you have symptoms of low or high blood sugar, before and after exercise, and as directed by provider   check feet daily for cuts, sores or redness enter blood sugar readings and medication or insulin into daily log take the blood sugar log to all doctor visits trim toenails straight across drink 6 to 8 glasses of water each day eat fish at least once per week fill half of plate with  vegetables limit fast food meals to no more than 1 per week manage portion size prepare main meal at home 3 to 5 days each week read food labels for fat, fiber, carbohydrates and portion size reduce red meat to 2 to 3 times a week do heel pump exercise 2 to 3 times each day keep feet up while sitting wash and dry feet carefully every day wear comfortable, cotton socks wear comfortable, well-fitting shoes - check blood pressure 3 times per week - choose a place to take my blood pressure (home, clinic or office, retail store) - write blood pressure results in a log or diary - learn about high blood pressure - keep a blood pressure log - take blood pressure log to all doctor appointments - call doctor for signs and symptoms of high blood pressure - develop an action plan for high blood pressure - keep all doctor appointments - take medications for blood pressure exactly as  prescribed - report new symptoms to your doctor - eat more whole grains, fruits and vegetables, lean meats and healthy fats - call for medicine refill 2 or 3 days before it runs out - take all medications exactly as prescribed - call doctor with any symptoms you believe are related to your medicine - call doctor when you experience any new symptoms - go to all doctor appointments as scheduled - adhere to prescribed diet: Heart healthy/ADA       Plan:Telephone follow up appointment with care management team member scheduled for:  09-23-2021   Noreene Larsson RN, MSN, [facility] Mobile: [phone]

## 2021-09-05 ENCOUNTER — Ambulatory Visit (INDEPENDENT_AMBULATORY_CARE_PROVIDER_SITE_OTHER): Payer: Medicare Other | Admitting: Nurse Practitioner

## 2021-09-05 ENCOUNTER — Ambulatory Visit: Payer: Self-pay | Admitting: *Deleted

## 2021-09-05 ENCOUNTER — Encounter: Payer: Self-pay | Admitting: Nurse Practitioner

## 2021-09-05 VITALS — BP 141/83 | HR 77 | Temp 98.5°F | Wt 184.2 lb

## 2021-09-05 DIAGNOSIS — R0602 Shortness of breath: Secondary | ICD-10-CM | POA: Diagnosis not present

## 2021-09-05 DIAGNOSIS — I25110 Atherosclerotic heart disease of native coronary artery with unstable angina pectoris: Secondary | ICD-10-CM

## 2021-09-05 NOTE — Progress Notes (Signed)
Results discussed with patient during visit. EKG is consistent with prior.

## 2021-09-05 NOTE — Telephone Encounter (Signed)
Reason for Disposition  [1] MODERATE dizziness (e.g., interferes with normal activities) AND [2] has NOT been evaluated by doctor (or NP/PA) for this  (Exception: Dizziness caused by heat exposure, sudden standing, or poor fluid intake.)  Answer Assessment - Initial Assessment Questions 1. DESCRIPTION: "Describe your dizziness."     Out of breath with exertion, dizziness 2. LIGHTHEADED: "Do you feel lightheaded?" (e.g., somewhat faint, woozy, weak upon standing)     Light headed with exertion 3. VERTIGO: "Do you feel like either you or the room is spinning or tilting?" (i.e. vertigo)     no 4. SEVERITY: "How bad is it?"  "Do you feel like you are going to faint?" "Can you stand and walk?"   - MILD: Feels slightly dizzy, but walking normally.   - MODERATE: Feels unsteady when walking, but not falling; interferes with normal activities (e.g., school, work).   - SEVERE: Unable to walk without falling, or requires assistance to walk without falling; feels like passing out now.      Over weekend worse- mild/moderate 5. ONSET:  "When did the dizziness begin?"     Worse over 3-4 days 6. AGGRAVATING FACTORS: "Does anything make it worse?" (e.g., standing, change in head position)     exertion 7. HEART RATE: "Can you tell me your heart rate?" "How many beats in 15 seconds?"  (Note: not all patients can do this)       HR- 81- fluctuating  8. CAUSE: "What do you think is causing the dizziness?"     Not sure- comes with exertion 9. RECURRENT SYMPTOM: "Have you had dizziness before?" If Yes, ask: "When was the last time?" "What happened that time?"     no 10. OTHER SYMPTOMS: "Do you have any other symptoms?" (e.g., fever, chest pain, vomiting, diarrhea, bleeding)       no 11. PREGNANCY: "Is there any chance you are pregnant?" "When was your last menstrual period?"  Protocols used: Dizziness - Lightheadedness-A-AH

## 2021-09-05 NOTE — Telephone Encounter (Signed)
  Chief Complaint: new onset dizziness with exertion  Symptoms: dizzy, SOB- with exertion Frequency: dizziness started 3-4 days ago Pertinent Negatives: Patient denies chest pain, fever, vomiting, diarrhea, bleeding Disposition: '[]'$ ED /'[]'$ Urgent Care (no appt availability in office) / '[x]'$ Appointment(In office/virtual)/ '[]'$  [facility]/ '[]'$ Home Care/ '[]'$ Refused Recommended Disposition /'[]'$ [facility]/ '[]'$  Follow-up with PCP Additional Notes:  Advised ED for worsening symptoms

## 2021-09-05 NOTE — Progress Notes (Unsigned)
BP (!) 141/83   Pulse 77   Temp 98.5 F (36.9 C) (Oral)   Wt 184 lb 3.2 oz (83.6 kg)   SpO2 98%   BMI 24.30 kg/m    Subjective:    Patient ID: Sean Hacker., male    DOB: 1940/07/02, 81 y.o.   MRN: [id number]  HPI: Sean Strole. is a 81 y.o. male  Chief Complaint  Patient presents with   Shortness of Breath    Recent heart cath a few weeks ago per patient, states SOB on exertion. States his BP is fluctuating as well as his HR. Denies chest pain, N/V/D. Denies visual disturbance. Denies HA   Dizziness   SHORTNESS OF BREATH Duration: symptoms have been going on since his heart cath 3 weeks ago.  States he had a terrible weekend.  On Saturday, he took a shower and his HR got up to 109 and he got very dizzy.  His wife and daughter had to help him out of the shower. He walks out to the car and he is out of breath.  States his heart feels like it is pounding like he just ran a race. On Saturday and Sunday he has been weak and dizzy. He has been scared to walk.   When he gets up in the morning his blood pressure has been 170/97 then he eats breakfast and his symptoms improve to normal.    States his PVCs are coming and going. He is going to see another Cardiologist who is going to see him on August 4th.  Onset: gradual Description of breathing discomfort:  Severity: severe Related to exertion: yes Cough: yes Chest tightness: yes after he has been up moving around Wheezing: no Fevers: no Chest pain: no Palpitations: yes  Nausea: a little  Diaphoresis: yes Deconditioning: yes Status: worse Aggravating factors: walking    Relevant past medical, surgical, family and social history reviewed and updated as indicated. Interim medical history since our last visit reviewed. Allergies and medications reviewed and updated.  Review of Systems  Constitutional:  Negative for diaphoresis and fever.  Respiratory:  Positive for chest tightness and shortness of breath. Negative for  wheezing.   Cardiovascular:  Negative for chest pain.  Neurological:  Positive for dizziness.    Per HPI unless specifically indicated above     Objective:    BP (!) 141/83   Pulse 77   Temp 98.5 F (36.9 C) (Oral)   Wt 184 lb 3.2 oz (83.6 kg)   SpO2 98%   BMI 24.30 kg/m   Wt Readings from Last 3 Encounters:  09/05/21 184 lb 3.2 oz (83.6 kg)  08/23/21 187 lb (84.8 kg)  08/19/21 182 lb (82.6 kg)    Physical Exam Vitals and nursing note reviewed.  Constitutional:      General: He is not in acute distress.    Appearance: Normal appearance. He is not ill-appearing, toxic-appearing or diaphoretic.  HENT:     Head: Normocephalic.     Right Ear: External ear normal.     Left Ear: External ear normal.     Nose: Nose normal. No congestion or rhinorrhea.     Mouth/Throat:     Mouth: Mucous membranes are moist.  Eyes:     General:        Right eye: No discharge.        Left eye: No discharge.     Extraocular Movements: Extraocular movements intact.     Conjunctiva/sclera: Conjunctivae normal.  Pupils: Pupils are equal, round, and reactive to light.  Cardiovascular:     Rate and Rhythm: Normal rate and regular rhythm.     Heart sounds: No murmur heard. Pulmonary:     Effort: Pulmonary effort is normal. No respiratory distress.     Breath sounds: Normal breath sounds. No wheezing, rhonchi or rales.  Abdominal:     General: Abdomen is flat. Bowel sounds are normal.  Musculoskeletal:     Cervical back: Normal range of motion and neck supple.  Skin:    General: Skin is warm and dry.     Capillary Refill: Capillary refill takes less than 2 seconds.  Neurological:     General: No focal deficit present.     Mental Status: He is alert and oriented to person, place, and time.  Psychiatric:        Mood and Affect: Mood normal.        Behavior: Behavior normal.        Thought Content: Thought content normal.        Judgment: Judgment normal.     Results for orders placed  or performed in visit on 09/05/21  Comp Met (CMET)  Result Value Ref Range   Glucose 152 (H) 70 - 99 mg/dL   BUN 19 8 - 27 mg/dL   Creatinine, Ser 1.40 (H) 0.76 - 1.27 mg/dL   eGFR 51 (L) >59 mL/min/1.73   BUN/Creatinine Ratio 14 10 - 24   Sodium 136 134 - 144 mmol/L   Potassium 4.4 3.5 - 5.2 mmol/L   Chloride 98 96 - 106 mmol/L   CO2 24 20 - 29 mmol/L   Calcium 9.6 8.6 - 10.2 mg/dL   Total Protein 6.7 6.0 - 8.5 g/dL   Albumin 4.0 3.8 - 4.8 g/dL   Globulin, Total 2.7 1.5 - 4.5 g/dL   Albumin/Globulin Ratio 1.5 1.2 - 2.2   Bilirubin Total 0.2 0.0 - 1.2 mg/dL   Alkaline Phosphatase 72 44 - 121 IU/L   AST 17 0 - 40 IU/L   ALT 16 0 - 44 IU/L  CBC w/Diff  Result Value Ref Range   WBC 5.5 3.4 - 10.8 x10E3/uL   RBC 3.77 (L) 4.14 - 5.80 x10E6/uL   Hemoglobin 11.6 (L) 13.0 - 17.7 g/dL   Hematocrit 34.0 (L) 37.5 - 51.0 %   MCV 90 79 - 97 fL   MCH 30.8 26.6 - 33.0 pg   MCHC 34.1 31.5 - 35.7 g/dL   RDW 13.0 11.6 - 15.4 %   Platelets 198 150 - 450 x10E3/uL   Neutrophils 50 Not Estab. %   Lymphs 36 Not Estab. %   Monocytes 13 Not Estab. %   Eos 1 Not Estab. %   Basos 0 Not Estab. %   Neutrophils Absolute 2.8 1.4 - 7.0 x10E3/uL   Lymphocytes Absolute 2.0 0.7 - 3.1 x10E3/uL   Monocytes Absolute 0.7 0.1 - 0.9 x10E3/uL   EOS (ABSOLUTE) 0.1 0.0 - 0.4 x10E3/uL   Basophils Absolute 0.0 0.0 - 0.2 x10E3/uL   Immature Granulocytes 0 Not Estab. %   Immature Grans (Abs) 0.0 0.0 - 0.1 x10E3/uL      Assessment & Plan:   Problem List Items Addressed This Visit   None Visit Diagnoses     Shortness of breath    -  Primary   EKG done in office today which did not show new changes. Contacted Dr. Rockey Situ who is going to work patient in sooner than August  14. Labs ordered.   Relevant Orders   EKG 12-Lead (Completed)   Comp Met (CMET) (Completed)   CBC w/Diff (Completed)        Follow up plan: Return if symptoms worsen or fail to improve.   A total of 30 minutes were spent on this  encounter today.  When total time is documented, this includes both the face-to-face and non-face-to-face time personally spent before, during and after the visit on the date of the encounter reviewing symptoms, EKG and treatment.

## 2021-09-06 LAB — CBC WITH DIFFERENTIAL/PLATELET
Basophils Absolute: 0 10*3/uL (ref 0.0–0.2)
Basos: 0 %
EOS (ABSOLUTE): 0.1 10*3/uL (ref 0.0–0.4)
Eos: 1 %
Hematocrit: 34 % — ABNORMAL LOW (ref 37.5–51.0)
Hemoglobin: 11.6 g/dL — ABNORMAL LOW (ref 13.0–17.7)
Immature Grans (Abs): 0 10*3/uL (ref 0.0–0.1)
Immature Granulocytes: 0 %
Lymphocytes Absolute: 2 10*3/uL (ref 0.7–3.1)
Lymphs: 36 %
MCH: 30.8 pg (ref 26.6–33.0)
MCHC: 34.1 g/dL (ref 31.5–35.7)
MCV: 90 fL (ref 79–97)
Monocytes Absolute: 0.7 10*3/uL (ref 0.1–0.9)
Monocytes: 13 %
Neutrophils Absolute: 2.8 10*3/uL (ref 1.4–7.0)
Neutrophils: 50 %
Platelets: 198 10*3/uL (ref 150–450)
RBC: 3.77 x10E6/uL — ABNORMAL LOW (ref 4.14–5.80)
RDW: 13 % (ref 11.6–15.4)
WBC: 5.5 10*3/uL (ref 3.4–10.8)

## 2021-09-06 LAB — COMPREHENSIVE METABOLIC PANEL
ALT: 16 IU/L (ref 0–44)
AST: 17 IU/L (ref 0–40)
Albumin/Globulin Ratio: 1.5 (ref 1.2–2.2)
Albumin: 4 g/dL (ref 3.8–4.8)
Alkaline Phosphatase: 72 IU/L (ref 44–121)
BUN/Creatinine Ratio: 14 (ref 10–24)
BUN: 19 mg/dL (ref 8–27)
Bilirubin Total: 0.2 mg/dL (ref 0.0–1.2)
CO2: 24 mmol/L (ref 20–29)
Calcium: 9.6 mg/dL (ref 8.6–10.2)
Chloride: 98 mmol/L (ref 96–106)
Creatinine, Ser: 1.4 mg/dL — ABNORMAL HIGH (ref 0.76–1.27)
Globulin, Total: 2.7 g/dL (ref 1.5–4.5)
Glucose: 152 mg/dL — ABNORMAL HIGH (ref 70–99)
Potassium: 4.4 mmol/L (ref 3.5–5.2)
Sodium: 136 mmol/L (ref 134–144)
Total Protein: 6.7 g/dL (ref 6.0–8.5)
eGFR: 51 mL/min/{1.73_m2} — ABNORMAL LOW (ref >59)

## 2021-09-06 NOTE — Progress Notes (Signed)
Please let patient know that his lab work looks stable from prior and does not reveal a cause for his symptoms.  I spoke with Dr. Rockey Situ who is going to have someone reach out to him and get his appointment moved up.  Please let us know if she hasn't heard anything in the next couple of days.

## 2021-09-07 ENCOUNTER — Ambulatory Visit (INDEPENDENT_AMBULATORY_CARE_PROVIDER_SITE_OTHER): Payer: Medicare Other

## 2021-09-07 DIAGNOSIS — R0602 Shortness of breath: Secondary | ICD-10-CM

## 2021-09-08 ENCOUNTER — Ambulatory Visit (INDEPENDENT_AMBULATORY_CARE_PROVIDER_SITE_OTHER): Payer: Medicare Other | Admitting: Cardiovascular Disease

## 2021-09-08 ENCOUNTER — Ambulatory Visit (INDEPENDENT_AMBULATORY_CARE_PROVIDER_SITE_OTHER): Payer: Medicare Other

## 2021-09-08 ENCOUNTER — Encounter: Payer: Self-pay | Admitting: Cardiovascular Disease

## 2021-09-08 VITALS — BP 125/78 | HR 81 | Ht 73.0 in | Wt 185.0 lb

## 2021-09-08 DIAGNOSIS — E785 Hyperlipidemia, unspecified: Secondary | ICD-10-CM | POA: Diagnosis not present

## 2021-09-08 DIAGNOSIS — R0609 Other forms of dyspnea: Secondary | ICD-10-CM | POA: Diagnosis not present

## 2021-09-08 DIAGNOSIS — I25110 Atherosclerotic heart disease of native coronary artery with unstable angina pectoris: Secondary | ICD-10-CM

## 2021-09-08 DIAGNOSIS — I2510 Atherosclerotic heart disease of native coronary artery without angina pectoris: Secondary | ICD-10-CM

## 2021-09-08 DIAGNOSIS — I480 Paroxysmal atrial fibrillation: Secondary | ICD-10-CM | POA: Diagnosis not present

## 2021-09-08 DIAGNOSIS — I10 Essential (primary) hypertension: Secondary | ICD-10-CM

## 2021-09-08 LAB — ECHOCARDIOGRAM COMPLETE
AR max vel: 3.24 cm2
AV Area VTI: 3.48 cm2
AV Area mean vel: 3.37 cm2
AV Mean grad: 3 mmHg
AV Peak grad: 6.1 mmHg
Ao pk vel: 1.23 m/s
Area-P 1/2: 2.02 cm2
Calc EF: 58.7 %
S' Lateral: 1.8 cm
Single Plane A2C EF: 60.8 %
Single Plane A4C EF: 52.8 %

## 2021-09-08 MED ORDER — METOPROLOL SUCCINATE ER 25 MG PO TB24: 25.0000 mg | ORAL_TABLET | Freq: Every day | ORAL | 5 refills | Status: DC

## 2021-09-08 NOTE — Progress Notes (Signed)
Cardiology Office Note   Date:  09/08/2021   ID:  Sean Brandenburg., DOB 11-06-1940, MRN [id number]  PCP:  [name], NP  Cardiologist:   Kathlyn Sacramento, MD   Chief Complaint  Patient presents with   Other    CAD/Fatigue/Hx MI c/o fluctuating BP, dizziness and elevated HR. Meds reviewed verbally with pt.      History of Present Illness: Sean Reyes. is a 81 y.o. male who presents to establish cardiovascular care.  He is previously followed by Dr. Nehemiah Massed.  He has known history of paroxysmal atrial fibrillation, coronary artery disease and PVCs. Other medical problems include essential hypertension, hyperlipidemia, type 2 diabetes and chronic kidney disease. He presented in November 2022 with non-ST elevation myocardial infarction.  Cardiac catheterization was done which showed severe proximal RCA stenosis which was treated successfully with PCI and drug-eluting stent placement.  He had repeat cardiac catheterization recently which showed patent RCA stent with no significant restenosis.  There was borderline 60% stenosis in the distal LAD and mild distal left circumflex disease.  I personally reviewed the images.  He reports having paroxysmal atrial fibrillation for many years.  He underwent an ablation procedure [facility] many years ago and reports having a complication during the procedure that necessitated aborting the procedure.  He subsequently had ablation at [facility] by Dr. Marcello Moores with improvement.  Nonetheless, the patient was placed on propafenone.  Surprisingly, propafenone was discontinued even when he was diagnosed with myocardial infarction and had a stent placement.  He was on metoprolol that was discontinued earlier this year due to bradycardia with heart rate in the 40s.  Since then, the patient experienced significant worsening of exertional palpitations and shortness of breath.  The patient is severely limited by back pain which has been chronic.  He had 3 back  surgeries.  He denies chest pain.  He is a lifelong non-smoker.  He had an echocardiogram done yesterday which showed normal LV systolic function, trivial mitral regurgitation and normal size IVC.    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   Hyperlipidemia    Hypertension    Myocardial infarction ([place]) 12/2020   Nocturia    S/P ablation of atrial fibrillation    Ablative therapy   Sleep apnea    CPAP    Spinal stenosis    Tachycardia, unspecified     Past Surgical History:  Procedure Laterality Date   ABLATION     ANTERIOR LAT LUMBAR FUSION N/A 06/27/2017   Procedure: Anterior Lateral Lumbar Interbody  Fusion - Lumbar Two-Lumbar Three - Lumbar Three-Lumbar Four, Posterior Lumbar Interbody Fusion Lumbar Four- Five;  Surgeon: Kary Kos, MD;  Location: [place];  Service: Neurosurgery;  Laterality: N/A;  Anterior Lateral Lumbar Interbody  Fusion - Lumbar Two-Lumbar Three - Lumbar Three-Lumbar Four, Posterior Lumbar Interbody Fusion Lumbar Four- Five  BACK SURGERY     CARDIAC CATHETERIZATION     CARDIOVERSION N/A 08/29/2018   Procedure: CARDIOVERSION;  Surgeon: Corey Skains, MD;  Location: [place] [facility];  Service: Cardiovascular;  Laterality: N/A;   CARDIOVERSION N/A 09/24/2018   Procedure: CARDIOVERSION;  Surgeon: Corey Skains, MD;  Location: [facility];  Service: Cardiovascular;  Laterality: N/A;   COLONOSCOPY WITH PROPOFOL N/A 10/05/2015   Procedure: COLONOSCOPY WITH PROPOFOL;  Surgeon: Lollie Sails, MD;  Location: [facility]  ENDOSCOPY;  Service: Endoscopy;  Laterality: N/A;   COLONOSCOPY WITH PROPOFOL N/A 11/01/2020   Procedure: COLONOSCOPY WITH PROPOFOL;  Surgeon: Jonathon Bellows, MD;  Location: [facility] ENDOSCOPY;  Service: Gastroenterology;  Laterality: N/A;   CORONARY STENT INTERVENTION N/A 12/22/2020   Procedure: CORONARY STENT INTERVENTION;  Surgeon: [name], MD;  Location: [place] [facility];  Service: Cardiovascular;  Laterality: N/A;   ESOPHAGOGASTRODUODENOSCOPY N/A 11/01/2020   Procedure: ESOPHAGOGASTRODUODENOSCOPY (EGD);  Surgeon: Jonathon Bellows, MD;  Location: [facility] ENDOSCOPY;  Service: Gastroenterology;  Laterality: N/A;   ESOPHAGOGASTRODUODENOSCOPY (EGD) WITH PROPOFOL N/A 04/01/2018   Procedure: ESOPHAGOGASTRODUODENOSCOPY (EGD) WITH PROPOFOL;  Surgeon: Lollie Sails, MD;  Location: [facility] ENDOSCOPY;  Service: Endoscopy;  Laterality: N/A;   HERNIA REPAIR     JOINT REPLACEMENT Bilateral    hips  RT+  LEFT X2    LEFT HEART CATH AND CORONARY ANGIOGRAPHY N/A 12/22/2020   Procedure: LEFT HEART CATH AND CORONARY ANGIOGRAPHY;  Surgeon: [name], MD;  Location: [place] [facility];  Service: Cardiovascular;  Laterality: N/A;   LEFT HEART CATH AND CORONARY ANGIOGRAPHY Left 08/23/2021   Procedure: LEFT HEART CATH AND CORONARY ANGIOGRAPHY;  Surgeon: Corey Skains, MD;  Location: [place] [facility];  Service: Cardiovascular;  Laterality: Left;   LUMBAR LAMINECTOMY/DECOMPRESSION MICRODISCECTOMY Left 09/13/2016   Procedure: Microdiscectomy - Lumbar two-three,  Lumbar three- - left;  Surgeon: Kary Kos, MD;  Location: [place];  Service: Neurosurgery;  Laterality: Left;   SPINAL CORD STIMULATOR INSERTION  07/08/2019   TONSILLECTOMY       Current Outpatient Medications  Medication Sig Dispense Refill   acetaminophen (TYLENOL) 500 MG tablet Take 500 mg by mouth every 6 (six) hours as needed (pain).     clopidogrel (PLAVIX) 75 MG tablet Take 75 mg by mouth daily.     cyanocobalamin 1000 MCG  tablet Take by mouth.     ELIQUIS 5 MG TABS tablet Take 1 tablet (5 mg total) by mouth 2 (two) times daily. 180 tablet 4   ferrous sulfate 325 (65 FE) MG EC tablet Take 325 mg by mouth daily with breakfast.     finasteride (PROSCAR) 5 MG tablet Take 1 tablet (5 mg total) by mouth daily. 90 tablet 2   isosorbide mononitrate (IMDUR) 30 MG 24 hr tablet Take 30 mg by mouth daily.     metFORMIN (GLUCOPHAGE) 500 MG tablet Take 1 tablet (500 mg total) by mouth 2 (two) times daily with a meal. 180 tablet 4   omeprazole (PRILOSEC) 40 MG capsule Take 40 mg by mouth daily.     pregabalin (LYRICA) 25 MG capsule Take 1 capsule (25 mg total) by mouth 2 (two) times daily. 180 capsule 2   propafenone (RYTHMOL SR) 425 MG 12 hr capsule Take 425 mg by mouth 2 (two) times daily. Has been out for 3 days     rosuvastatin (CRESTOR) 5 MG tablet Take 1 tablet (5 mg total) by mouth daily. 90 tablet 2   solifenacin (VESICARE) 5 MG tablet Take  5 mg by mouth daily.     tamsulosin (FLOMAX) 0.4 MG CAPS capsule Take 0.8 mg by mouth daily.     traMADol (ULTRAM) 50 MG tablet Take 50 mg by mouth 2 (two) times daily as needed.     No current facility-administered medications for this visit.    Allergies:   Levaquin [levofloxacin in d5w], Shellfish allergy, Amiodarone, and Adhesive [tape]    Social History:  The patient  reports that he has never smoked. He has never used smokeless tobacco. He reports that he does not drink alcohol and does not use drugs.   Family History:  The patient's family history includes Brain cancer in his mother.    ROS:  Please see the history of present illness.   Otherwise, review of systems are positive for none.   All other systems are reviewed and negative.    PHYSICAL EXAM: VS:  BP 125/78 (BP Location: Left Arm, Patient Position: Sitting, Cuff Size: Normal)   Pulse 81   Ht '6\' 1"'$  (1.854 m)   Wt 185 lb (83.9 kg)   SpO2 99%   BMI 24.41 kg/m  , BMI Body mass index is 24.41 kg/m. GEN:  Well nourished, well developed, in no acute distress  HEENT: normal  Neck: no JVD, carotid bruits, or masses Cardiac: RRR; no murmurs, rubs, or gallops,no edema  Respiratory:  clear to auscultation bilaterally, normal work of breathing GI: soft, nontender, nondistended, + BS MS: no deformity or atrophy  Skin: warm and dry, no rash Neuro:  Strength and sensation are intact Psych: euthymic mood, full affect Distal pulses are palpable.   EKG:  EKG is ordered today. The ekg ordered today demonstrates sinus rhythm with first-degree AV block and prior inferior infarct.  Poor R wave progression in the anterior leads.   Recent Labs: 04/25/2021: Magnesium 1.6 08/04/2021: BNP 26.5 08/19/2021: TSH 4.150 09/05/2021: ALT 16; BUN 19; Creatinine, Ser 1.40; Hemoglobin 11.6; Platelets 198; Potassium 4.4; Sodium 136    Lipid Panel    Component Value Date/Time   CHOL 101 04/04/2021 1123   TRIG 96 04/04/2021 1123   HDL 40 04/04/2021 1123   CHOLHDL 2.6 12/21/2020 0652   VLDL 13 12/21/2020 0652   LDLCALC 43 04/04/2021 1123      Wt Readings from Last 3 Encounters:  09/08/21 185 lb (83.9 kg)  09/05/21 184 lb 3.2 oz (83.6 kg)  08/23/21 187 lb (84.8 kg)          09/08/2021    8:39 AM  PAD Screen  Previous PAD dx? No  Previous surgical procedure? Yes  Pain with walking? No  Feet/toe relief with dangling? No  Painful, non-healing ulcers? No  Extremities discolored? No      ASSESSMENT AND PLAN:  1.  Coronary artery disease involving native coronary arteries: Status post myocardial infarction and stent placement to the right coronary artery in November 2022.  He is on clopidogrel without aspirin as he is on anticoagulation with Eliquis.  Recommend stopping clopidogrel in November to minimize the risk of bleeding.  He has no chest pain at the present time and recent cardiac catheterization showed patent RCA stent.  He does have borderline disease in the distal LAD but the vessel is too small  in that area..  2.  Paroxysmal atrial fibrillation: He reports significant exertional palpitations and felt significantly worse since metoprolol was discontinued earlier this year.  Propafenone is contraindicated in the setting of coronary artery disease.  Thus, I discontinued propafenone and  elected to start metoprolol tartrate 25 mg twice daily.  I requested a 2 weeks ZIO monitor.  If he develops recurrent symptomatic atrial fibrillation, he will require a different antiarrhythmic medication.  He did not tolerate amiodarone in the past due to tremors and thyroid toxicity.  Thus, antiarrhythmic options are likely limited to Multaq and dofetilide.  3.  Essential hypertension: Blood pressures controlled on current medications.  4.  Hyperlipidemia: Continue rosuvastatin with a target LDL of less than 70.  5.  Type 2 diabetes: Consider an SGLT2 inhibitor given his cardiac history.  6.  Severe exertional dyspnea: I reviewed both recent cardiac catheterization and echocardiogram.  I cannot find a cardiac explanation for his dyspnea.  I suspect that there is a component of physical deconditioning.  In addition, he seems to feel worse when his heart rate is high and hopefully his symptoms will improve with metoprolol.    Disposition:   FU with me in 1 month  Signed,  Kathlyn Sacramento, MD  09/08/2021 8:54 AM    Sean Reyes

## 2021-09-08 NOTE — Patient Instructions (Signed)
Medication Instructions:  Your physician has recommended you make the following change in your medication:   STOP Rythmol   START Metoprolol Succinate 25 mg daily. An Rx has been sent to your pharmacy  *If you need a refill on your cardiac medications before your next appointment, please call your pharmacy*   Lab Work: None ordered If you have labs (blood work) drawn today and your tests are completely normal, you will receive your results only by: [place] (if you have MyChart) OR A paper copy in the mail If you have any lab test that is abnormal or we need to change your treatment, we will call you to review the results.   Testing/Procedures: Your provider has ordered a heart monitor to wear for 14 days. This will be mailed to your home with instructions on placement. Once you have finished the time frame requested, you will return monitor in box provided.      Follow-Up: At [facility], you and your health needs are our priority.  As part of our continuing mission to provide you with exceptional heart care, we have created designated Provider Care Teams.  These Care Teams include your primary Cardiologist (physician) and Advanced Practice Providers (APPs -  Physician Assistants and Nurse Practitioners) who all work together to provide you with the care you need, when you need it.  We recommend signing up for the patient portal called "MyChart".  Sign up information is provided on this After Visit Summary.  MyChart is used to connect with patients for Virtual Visits (Telemedicine).  Patients are able to view lab/test results, encounter notes, upcoming appointments, etc.  Non-urgent messages can be sent to your provider as well.   To learn more about what you can do with MyChart, go to [email or web address].    Your next appointment:   4 week(s)  The format for your next appointment:   In Person  Provider:   You may see Kathlyn Sacramento, MD or one of the following  Advanced Practice Providers on your designated Care Team:   Murray Hodgkins, NP Christell Faith, PA-C Cadence Kathlen Mody, [place]   Other Instructions N/A  Important Information About Sugar

## 2021-09-08 NOTE — Progress Notes (Signed)
Contacted via [place] morning [place], your echo returned and overall remains similar to previous.  I see you saw cardiology, I reviewed that note today.  Sounds like they have a set plan for you:) Any questions? Keep being amazing!!  Thank you for allowing me to participate in your care.  I appreciate you. Kindest regards, Jayvian Escoe

## 2021-09-11 DIAGNOSIS — I480 Paroxysmal atrial fibrillation: Secondary | ICD-10-CM | POA: Diagnosis not present

## 2021-09-12 ENCOUNTER — Ambulatory Visit (INDEPENDENT_AMBULATORY_CARE_PROVIDER_SITE_OTHER): Payer: Medicare Other | Admitting: *Deleted

## 2021-09-12 ENCOUNTER — Other Ambulatory Visit: Payer: Self-pay | Admitting: Nurse Practitioner

## 2021-09-12 DIAGNOSIS — Z0000 Encounter for general adult medical examination without abnormal findings: Secondary | ICD-10-CM

## 2021-09-12 DIAGNOSIS — E1159 Type 2 diabetes mellitus with other circulatory complications: Secondary | ICD-10-CM | POA: Diagnosis not present

## 2021-09-12 DIAGNOSIS — I152 Hypertension secondary to endocrine disorders: Secondary | ICD-10-CM | POA: Diagnosis not present

## 2021-09-12 DIAGNOSIS — I10 Essential (primary) hypertension: Secondary | ICD-10-CM | POA: Diagnosis not present

## 2021-09-12 DIAGNOSIS — I480 Paroxysmal atrial fibrillation: Secondary | ICD-10-CM | POA: Diagnosis not present

## 2021-09-12 DIAGNOSIS — E1169 Type 2 diabetes mellitus with other specified complication: Secondary | ICD-10-CM

## 2021-09-12 DIAGNOSIS — I252 Old myocardial infarction: Secondary | ICD-10-CM

## 2021-09-12 DIAGNOSIS — E785 Hyperlipidemia, unspecified: Secondary | ICD-10-CM | POA: Diagnosis not present

## 2021-09-12 NOTE — Progress Notes (Signed)
Subjective:   Sean Minish. is a 81 y.o. Sean who presents for Medicare Annual/Subsequent preventive examination.  I connected with  Sean Reyes. on 09/12/21 by a telephone enabled telemedicine application and verified that I am speaking with the correct person using two identifiers.   I discussed the limitations of evaluation and management by telemedicine. The patient expressed understanding and agreed to proceed.  Patient location: home  Provider location:   Tele-Health-home    Review of Systems     Cardiac Risk Factors include: advanced age (>63mn, >>41women);diabetes mellitus;Sean gender;obesity (BMI >30kg/m2);sedentary lifestyle;hypertension     Objective:    Today's Vitals   09/12/21 0908  PainSc: 6    There is no height or weight on file to calculate BMI.     09/12/2021    9:29 AM 08/05/2021    6:57 AM 06/20/2021   11:31 AM 06/08/2021    1:00 PM 03/09/2021    1:07 PM 12/31/2020    1:12 PM 12/21/2020    3:00 PM  Advanced Directives  Does Patient Have a Medical Advance Directive? No No Yes Yes No Yes Yes  Type of AComptrollerLiving will HHaugenLiving will [place]andridgeLiving will [place]st[place]Living will HLong [place]Living will  Does patient want to make changes to medical advance directive?      No - Patient declined No - Patient declined  Copy of HJolivuein Chart?   No - copy requested    No - copy requested  Would patient like information on creating a medical advance directive? No - Patient declined          Current Medications (verified) Outpatient Encounter Medications as of 09/12/2021  Medication Sig   acetaminophen (TYLENOL) 500 MG tablet Take 500 mg by mouth every 6 (six) hours as needed (pain).   clopidogrel (PLAVIX) 75 MG tablet Take 75 mg by mouth daily.   cyanocobalamin 1000 MCG tablet Take by mouth.   ELIQUIS 5 MG  TABS tablet Take 1 tablet (5 mg total) by mouth 2 (two) times daily.   ferrous sulfate 325 (65 FE) MG EC tablet Take 325 mg by mouth daily with breakfast.   finasteride (PROSCAR) 5 MG tablet Take 1 tablet (5 mg total) by mouth daily.   isosorbide mononitrate (IMDUR) 30 MG 24 hr tablet Take 30 mg by mouth daily.   metFORMIN (GLUCOPHAGE) 500 MG tablet Take 1 tablet (500 mg total) by mouth 2 (two) times daily with a meal.   metoprolol succinate (TOPROL XL) 25 MG 24 hr tablet Take 1 tablet (25 mg total) by mouth daily.   omeprazole (PRILOSEC) 40 MG capsule Take 40 mg by mouth daily.   pregabalin (LYRICA) 25 MG capsule Take 1 capsule (25 mg total) by mouth 2 (two) times daily.   rosuvastatin (CRESTOR) 5 MG tablet Take 1 tablet (5 mg total) by mouth daily.   solifenacin (VESICARE) 5 MG tablet Take 5 mg by mouth daily.   tamsulosin (FLOMAX) 0.4 MG CAPS capsule Take 0.8 mg by mouth daily.   traMADol (ULTRAM) 50 MG tablet Take 50 mg by mouth 2 (two) times daily as needed.   No facility-administered encounter medications on file as of 09/12/2021.    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   Hyperlipidemia    Hypertension    Myocardial infarction ([place]) 12/2020   Nocturia    S/P ablation of atrial fibrillation    Ablative therapy   Sleep apnea    CPAP    Spinal stenosis    Tachycardia, unspecified    Past Surgical History:   Procedure Laterality Date   ABLATION     ANTERIOR LAT LUMBAR FUSION N/A 06/27/2017   Procedure: Anterior Lateral Lumbar Interbody  Fusion - Lumbar Two-Lumbar Three - Lumbar Three-Lumbar Four, Posterior Lumbar Interbody Fusion Lumbar Four- Five;  Surgeon: Kary Kos, MD;  Location: [facility];  Service: Neurosurgery;  Laterality: N/A;  Anterior Lateral Lumbar Interbody  Fusion - Lumbar Two-Lumbar Three - Lumbar Three-Lumbar Four, Posterior Lumbar Interbody Fusion Lumbar Four- Five   BACK SURGERY     CARDIAC CATHETERIZATION     CARDIOVERSION N/A 08/29/2018   Procedure: CARDIOVERSION;  Surgeon: Corey Skains, MD;  Location: [facility];  Service: Cardiovascular;  Laterality: N/A;   CARDIOVERSION N/A 09/24/2018   Procedure: CARDIOVERSION;  Surgeon: Corey Skains, MD;  Location: [facility];  Service: Cardiovascular;  Laterality: N/A;   COLONOSCOPY WITH PROPOFOL N/A 10/05/2015   Procedure: COLONOSCOPY WITH PROPOFOL;  Surgeon: Lollie Sails, MD;  Location: [facility] END[facility]COPY;  Service: Endoscopy;  Laterality: N/A;   COLONOSCOPY WITH PROPOFOL N/A 11/01/2020   Procedure: COLONOSCOPY WITH PROPOFOL;  Surgeon: Jonathon Bellows, MD;  Location: [facility] ENDOSCOPY;  Service: Gastroenterology;  Laterality: N/A;   CORONARY STENT INTERVENTION N/A 12/22/2020   Procedure: CORONARY STENT INTERVENTION;  Surgeon: [name], MD;  Location: [place] [facility];  Service: Cardiovascular;  Laterality: N/A;   ESOPHAGOGASTRODUODENOSCOPY N/A 11/01/2020   Procedure: ESOPHAGOGASTRODUODENOSCOPY (EGD);  Surgeon: Jonathon Bellows, MD;  Location: [facility]COPY;  Service: Gastroenterology;  Laterality: N/A;   ESOPHAGOGASTRODUODENOSCOPY (EGD) WITH PROPOFOL N/A 04/01/2018   Procedure: ESOPHAGOGASTRODUODENOSCOPY (EGD) WITH PROPOFOL;  Surgeon: Lollie Sails, MD;  Location: [facility] ENDOSCOPY;  Service: Endoscopy;  Laterality: N/A;   HERNIA REPAIR     JOINT REPLACEMENT Bilateral    hips  RT+  LEFT X2    LEFT HEART CATH AND CORONARY  ANGIOGRAPHY N/A 12/22/2020   Procedure: LEFT HEART CATH AND CORONARY ANGIOGRAPHY;  Surgeon: [name], MD;  Location: [place] [facility];  Service: Cardiovascular;  Laterality: N/A;   LEFT HEART CATH AND CORONARY ANGIOGRAPHY Left 08/23/2021   Procedure: LEFT HEART CATH AND CORONARY ANGIOGRAPHY;  Surgeon: Corey Skains, MD;  Location: [place] [facility];  Service: Cardiovascular;  Laterality: Left;   LUMBAR LAMINECTOMY/DECOMPRESSION MICRODISCECTOMY Left 09/13/2016   Procedure: Microdiscectomy - Lumbar two-three,  Lumbar three- - left;  Surgeon: Kary Kos, MD;  Location: [place];  Service: Neurosurgery;  Laterality: Left;   SPINAL CORD STIMULATOR INSERTION  07/08/2019   TONSILLECTOMY     Family History  Problem Relation Age of Onset   Brain cancer Mother    Kidney disease Neg Hx  Prostate cancer Neg Hx    Kidney cancer Neg Hx    Bladder Cancer Neg Hx    Social History   Socioeconomic History   Marital status: Married    Spouse name: Malachy Mood    Number of children: 2   Years of education: Not on file   Highest education level: High school graduate  Occupational History   Occupation: retired   Tobacco Use   Smoking status: Never   Smokeless tobacco: Never  Vaping Use   Vaping Use: Never used  Substance and Sexual Activity   Alcohol use: No   Drug use: No   Sexual activity: Not on file  Other Topics Concern   Not on file  Social History Narrative   MarriedGets regular exercise.      Lives in [place] with wife/ daughter. Never smoked; no alcohol. 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    Social Determinants of Health   Financial Resource Strain: Low Risk  (09/12/2021)   Overall Financial Resource Strain (CARDIA)    Difficulty of Paying Living Expenses: Not hard at all  Food Insecurity: No Food Insecurity (09/12/2021)   Hunger Vital Sign    Worried About Running Out of Food in the Last Year: Never true    Ran Out of  Food in the Last Year: Never true  Transportation Needs: No Transportation Needs (09/12/2021)   PRAPARE - Hydrologist (Medical): No    Lack of Transportation (Non-Medical): No  Physical Activity: Inactive (09/12/2021)   Exercise Vital Sign    Days of Exercise per Week: 0 days    Minutes of Exercise per Session: 0 min  Stress: No Stress Concern Present (09/12/2021)   [place]    Feeling of Stress : Only a little  Social Connections: Moderately Integrated (09/12/2021)   Social Connection and Isolation Panel [NHANES]    Frequency of Communication with Friends and Family: Three times a week    Frequency of Social Gatherings with Friends and Family: Twice a week    Attends Religious Services: More than 4 times per year    Active Member of [employer] or Organizations: No    Attends Music therapist: Never    Marital Status: Married    Tobacco Counseling Counseling given: Not Answered   Clinical Intake:  Pre-visit preparation completed: Yes  Pain : 0-10 Pain Score: 6  Pain Location: Neck Pain Descriptors / Indicators: Constant, Burning, Aching Pain Onset: More than a month ago Pain Frequency: Constant     Nutritional Risks: None Diabetes: Yes CBG done?: No Did pt. bring in CBG monitor from home?: No  How often do you need to have someone help you when you read instructions, pamphlets, or other written materials from your doctor or pharmacy?: 1 - Never  Diabetic?  Yes  Nutrition Risk Assessment:  Has the patient had any N/V/D within the last 2 months?  No  Does the patient have any non-healing wounds?  No  Has the patient had any unintentional weight loss or weight gain?  No   Diabetes:  Is the patient diabetic?  Yes  If diabetic, was a CBG obtained today?  No  Did the patient bring in their glucometer from home?  No  How often do you monitor your CBG's? 2 2-3 times  a week.   Financial Strains and Diabetes Management:  Are you having any financial strains with the  device, your supplies or your medication? No .  Does the patient want to be seen by Chronic Care Management for management of their diabetes?  No  Would the patient like to be referred to a Nutritionist or for Diabetic Management?  No   Diabetic Exams:  Diabetic Eye Exam:. Pt has been advised about the importance in completing this exam  Diabetic Foot Exam: Pt has been advised about the importance in completing this exam.  Interpreter Needed?: No  Information entered by :: Leroy Kennedy LPN   Activities of Daily Living    09/12/2021    9:20 AM 08/23/2021   11:05 AM  In your present state of health, do you have any difficulty performing the following activities:  Hearing? 1 0  Vision? 0 0  Difficulty concentrating or making decisions? 0 0  Walking or climbing stairs? 1   Comment  A little bit.  Dressing or bathing? 0 0  Doing errands, shopping? 0   Preparing Food and eating ? N   Using the Toilet? N   In the past six months, have you accidently leaked urine? N   Do you have problems with loss of bowel control? N   Managing your Medications? N   Managing your Finances? N   Housekeeping or managing your Housekeeping? N     Patient Care Team: [name], NP as PCP - General (Nurse Practitioner) [name], MD as PCP - Family Medicine (Family Medicine) Corey Skains, MD as Consulting Physician (Internal Medicine) Ralene Bathe, MD (Dermatology) Kary Kos, MD as Consulting Physician (Neurosurgery) Hooten, Laurice Record, MD (Orthopedic Surgery) Marlaine Hind, MD as Consulting Physician (Physical Medicine and Rehabilitation) Christy Sartorius, MD as Referring Physician (Urology) Vanita Ingles, RN as Case Manager (General Practice)  Indicate any recent Medical Services you may have received from other than [facility] providers in the past year (date may be  approximate).     Assessment:   This is a routine wellness examination for [place].  Hearing/Vision screen Hearing Screening - Comments:: Bilateral hearing aids Vision Screening - Comments:: Up to date [place]  Dietary issues and exercise activities discussed: Exercise limited by: None identified;orthopedic condition(s)   Goals Addressed             This Visit's Progress    Patient Stated       Would like to decrease some of his pain.      Depression Screen    09/12/2021    9:19 AM 08/19/2021   11:28 AM 05/18/2021    3:47 PM 04/25/2021    1:28 PM 04/04/2021   11:18 AM 01/19/2021   12:48 PM 10/25/2020    4:15 PM  PHQ 2/9 Scores  PHQ - 2 Score 0  0 0 0 0 0  PHQ- 9 Score   '3 2 1 2   '$ Exception Documentation  Patient refusal         Fall Risk    08/19/2021   11:27 AM 04/25/2021    1:28 PM 04/04/2021   11:18 AM 12/31/2020    1:07 PM 10/25/2020    4:15 PM  Fall Risk   Falls in the past year? 0 0 0 0 0  Number falls in past yr: 0 0 0 0 0  Injury with Fall? 0 0 0  0  Risk for fall due to : No Fall Risks No Fall Risks No Fall Risks  No Fall Risks  Follow up Falls evaluation completed Falls  evaluation completed Falls evaluation completed  Falls evaluation completed    FALL RISK PREVENTION PERTAINING TO THE HOME:  Any stairs in or around the home? Yes  If so, are there any without handrails? No  Home free of loose throw rugs in walkways, pet beds, electrical cords, etc? No  Adequate lighting in your home to reduce risk of falls? No   ASSISTIVE DEVICES UTILIZED TO PREVENT FALLS:  Life alert? No  Use of a cane, walker or w/c? Yes  Grab bars in the bathroom? Yes  Shower chair or bench in shower? Yes  Elevated toilet seat or a handicapped toilet? Yes   TIMED UP AND GO:  Was the test performed? No .    Cognitive Function:        09/12/2021    9:12 AM 09/10/2020    9:09 AM 09/08/2019    9:52 AM 01/20/2019   10:58 AM 08/12/2018    9:51 AM  6CIT Screen  What Year? 0  points 0 points 0 points 0 points 0 points  What month? 0 points 0 points 0 points 0 points 0 points  What time? 0 points 0 points 0 points 0 points 0 points  Count back from 20 0 points 0 points 0 points 0 points 0 points  Months in reverse 0 points 0 points 0 points 0 points 0 points  Repeat phrase 0 points 0 points 2 points 0 points 0 points  Total Score 0 points 0 points 2 points 0 points 0 points    Immunizations Immunization History  Administered Date(s) Administered   Fluad Quad(high Dose 65+) 10/25/2020   Influenza, High Dose Seasonal PF 11/16/2015, 12/05/2016   Influenza,inj,Quad PF,6+ Mos 12/01/2014   Influenza-Unspecified 11/05/2017, 11/14/2018   PFIZER(Purple Top)SARS-COV-2 Vaccination 02/24/2019, 03/17/2019, 12/16/2019   Pneumococcal Conjugate-13 09/03/2013   Pneumococcal Polysaccharide-23 06/02/2015   Td 06/02/2015   Tdap 06/04/2020   Zoster Recombinat (Shingrix) 08/28/2017, 12/06/2017, 01/01/2018   Zoster, Live 02/14/2011    TDAP status: Up to date  Flu Vaccine status: Up to date  Pneumococcal vaccine status: Up to date  Covid-19 vaccine status: Information provided on how to obtain vaccines.   Qualifies for Shingles Vaccine? No   Zostavax completed Yes   Shingrix Completed?: Yes  Screening Tests Health Maintenance  Topic Date Due   OPHTHALMOLOGY EXAM  05/13/2020   COVID-19 Vaccine (4 - Booster for Pfizer series) 09/28/2021 (Originally 02/10/2020)   INFLUENZA VACCINE  09/13/2021   HEMOGLOBIN A1C  12/18/2021   FOOT EXAM  05/19/2022   TETANUS/TDAP  06/05/2030   Pneumonia Vaccine 63+ Years old  Completed   Hepatitis C Screening  Completed   Zoster Vaccines- Shingrix  Completed   HPV VACCINES  Aged Out   COLONOSCOPY (Pts 45-5yr Insurance coverage will need to be confirmed)  Discontinued    Health Maintenance  Health Maintenance Due  Topic Date Due   OPHTHALMOLOGY EXAM  05/13/2020    Colorectal cancer screening: No longer required.   Lung  Cancer Screening: (Low Dose CT Chest recommended if Age 79-80years, 30 pack-year currently smoking OR have quit w/in 15years.) does not qualify.   Lung Cancer Screening Referral:   Additional Screening:  Hepatitis C Screening: does not qualify; Completed 2022  Vision Screening: Recommended annual ophthalmology exams for early detection of glaucoma and other disorders of the eye. Is the patient up to date with their annual eye exam?  Yes  Who is the provider or what is the name of the office  in which the patient attends annual eye exams? [facility] If pt is not established with a provider, would they like to be referred to a provider to establish care? No .   Dental Screening: Recommended annual dental exams for proper oral hygiene  Community Resource Referral / Chronic Care Management: CRR required this visit?  No   CCM required this visit?  No      Plan:     I have personally reviewed and noted the following in the patient's chart:   Medical and social history Use of alcohol, tobacco or illicit drugs  Current medications and supplements including opioid prescriptions. Patient is not currently taking opioid prescriptions. Functional ability and status Nutritional status Physical activity Advanced directives List of other physicians Hospitalizations, surgeries, and ER visits in previous 12 months Vitals Screenings to include cognitive, depression, and falls Referrals and appointments  In addition, I have reviewed and discussed with patient certain preventive protocols, quality metrics, and best practice recommendations. A written personalized care plan for preventive services as well as general preventive health recommendations were provided to patient.     Leroy Kennedy, LPN   [id number]/50/[id number]   Nurse Notes:

## 2021-09-12 NOTE — Patient Instructions (Signed)
Sean Reyes , Thank you for taking time to come for your Medicare Wellness Visit. I appreciate your ongoing commitment to your health goals. Please review the following plan we discussed and let me know if I can assist you in the future.   Screening recommendations/referrals: Colonoscopy: no longer required Recommended yearly ophthalmology/optometry visit for glaucoma screening and checkup Recommended yearly dental visit for hygiene and checkup  Vaccinations: Influenza vaccine: up to date Pneumococcal vaccine: up to date Tdap vaccine: up to date Shingles vaccine: up to date    Advanced directives: Education provided  Conditions/risks identified:   Next appointment: CASE MANAGER TELEPHONE 09-23-2021   10-03-2021 @ 1:00  [place] 65 Years and Older, Male Preventive care refers to lifestyle choices and visits with your health care provider that can promote health and wellness. What does preventive care include? A yearly physical exam. This is also called an annual well check. Dental exams once or twice a year. Routine eye exams. Ask your health care provider how often you should have your eyes checked. Personal lifestyle choices, including: Daily care of your teeth and gums. Regular physical activity. Eating a healthy diet. Avoiding tobacco and drug use. Limiting alcohol use. Practicing safe sex. Taking low doses of aspirin every day. Taking vitamin and mineral supplements as recommended by your health care provider. What happens during an annual well check? The services and screenings done by your health care provider during your annual well check will depend on your age, overall health, lifestyle risk factors, and family history of disease. Counseling  Your health care provider may ask you questions about your: Alcohol use. Tobacco use. Drug use. Emotional well-being. Home and relationship well-being. Sexual activity. Eating habits. History of falls. Memory  and ability to understand (cognition). Work and work Statistician. Screening  You may have the following tests or measurements: Height, weight, and BMI. Blood pressure. Lipid and cholesterol levels. These may be checked every 5 years, or more frequently if you are over 36 years old. Skin check. Lung cancer screening. You may have this screening every year starting at age 26 if you have a 30-pack-year history of smoking and currently smoke or have quit within the past 15 years. Fecal occult blood test (FOBT) of the stool. You may have this test every year starting at age 5. Flexible sigmoidoscopy or colonoscopy. You may have a sigmoidoscopy every 5 years or a colonoscopy every 10 years starting at age 61. Prostate cancer screening. Recommendations will vary depending on your family history and other risks. Hepatitis C blood test. Hepatitis B blood test. Sexually transmitted disease (STD) testing. Diabetes screening. This is done by checking your blood sugar (glucose) after you have not eaten for a while (fasting). You may have this done every 1-3 years. Abdominal aortic aneurysm (AAA) screening. You may need this if you are a current or former smoker. Osteoporosis. You may be screened starting at age 61 if you are at high risk. Talk with your health care provider about your test results, treatment options, and if necessary, the need for more tests. Vaccines  Your health care provider may recommend certain vaccines, such as: Influenza vaccine. This is recommended every year. Tetanus, diphtheria, and acellular pertussis (Tdap, Td) vaccine. You may need a Td booster every 10 years. Zoster vaccine. You may need this after age 40. Pneumococcal 13-valent conjugate (PCV13) vaccine. One dose is recommended after age 77. Pneumococcal polysaccharide (PPSV23) vaccine. One dose is recommended after age 32. Talk to your  health care provider about which screenings and vaccines you need and how often you  need them. This information is not intended to replace advice given to you by your health care provider. Make sure you discuss any questions you have with your health care provider. Document Released: 02/26/2015 Document Revised: 10/20/2015 Document Reviewed: 12/01/2014 Elsevier Interactive Patient Education  2017 [place] Prevention in the Home Falls can cause injuries. They can happen to people of all ages. There are many things you can do to make your home safe and to help prevent falls. What can I do on the outside of my home? Regularly fix the edges of walkways and driveways and fix any cracks. Remove anything that might make you trip as you walk through a door, such as a raised step or threshold. Trim any bushes or trees on the path to your home. Use bright outdoor lighting. Clear any walking paths of anything that might make someone trip, such as rocks or tools. Regularly check to see if handrails are loose or broken. Make sure that both sides of any steps have handrails. Any raised decks and porches should have guardrails on the edges. Have any leaves, snow, or ice cleared regularly. Use sand or salt on walking paths during winter. Clean up any spills in your garage right away. This includes oil or grease spills. What can I do in the bathroom? Use night lights. Install grab bars by the toilet and in the tub and shower. Do not use towel bars as grab bars. Use non-skid mats or decals in the tub or shower. If you need to sit down in the shower, use a plastic, non-slip stool. Keep the floor dry. Clean up any water that spills on the floor as soon as it happens. Remove soap buildup in the tub or shower regularly. Attach bath mats securely with double-sided non-slip rug tape. Do not have throw rugs and other things on the floor that can make you trip. What can I do in the bedroom? Use night lights. Make sure that you have a light by your bed that is easy to reach. Do not  use any sheets or blankets that are too big for your bed. They should not hang down onto the floor. Have a firm chair that has side arms. You can use this for support while you get dressed. Do not have throw rugs and other things on the floor that can make you trip. What can I do in the kitchen? Clean up any spills right away. Avoid walking on wet floors. Keep items that you use a lot in easy-to-reach places. If you need to reach something above you, use a strong step stool that has a grab bar. Keep electrical cords out of the way. Do not use floor polish or wax that makes floors slippery. If you must use wax, use non-skid floor wax. Do not have throw rugs and other things on the floor that can make you trip. What can I do with my stairs? Do not leave any items on the stairs. Make sure that there are handrails on both sides of the stairs and use them. Fix handrails that are broken or loose. Make sure that handrails are as long as the stairways. Check any carpeting to make sure that it is firmly attached to the stairs. Fix any carpet that is loose or worn. Avoid having throw rugs at the top or bottom of the stairs. If you do have throw rugs, attach them  to the floor with carpet tape. Make sure that you have a light switch at the top of the stairs and the bottom of the stairs. If you do not have them, ask someone to add them for you. What else can I do to help prevent falls? Wear shoes that: Do not have high heels. Have rubber bottoms. Are comfortable and fit you well. Are closed at the toe. Do not wear sandals. If you use a stepladder: Make sure that it is fully opened. Do not climb a closed stepladder. Make sure that both sides of the stepladder are locked into place. Ask someone to hold it for you, if possible. Clearly mark and make sure that you can see: Any grab bars or handrails. First and last steps. Where the edge of each step is. Use tools that help you move around (mobility  aids) if they are needed. These include: Canes. Walkers. Scooters. Crutches. Turn on the lights when you go into a dark area. Replace any light bulbs as soon as they burn out. Set up your furniture so you have a clear path. Avoid moving your furniture around. If any of your floors are uneven, fix them. If there are any pets around you, be aware of where they are. Review your medicines with your doctor. Some medicines can make you feel dizzy. This can increase your chance of falling. Ask your doctor what other things that you can do to help prevent falls. This information is not intended to replace advice given to you by your health care provider. Make sure you discuss any questions you have with your health care provider. Document Released: 11/26/2008 Document Revised: 07/08/2015 Document Reviewed: 03/06/2014 Elsevier Interactive Patient Education  2017 [employer].

## 2021-09-13 NOTE — Telephone Encounter (Signed)
Requested Prescriptions  Pending Prescriptions Disp Refills  . rosuvastatin (CRESTOR) 5 MG tablet [Pharmacy Med Name: ROSUVASTATIN CALCIUM 5 MG TAB] 90 tablet 0    Sig: Take 1 tablet (5 mg total) by mouth daily.     Cardiovascular:  Antilipid - Statins 2 Failed - 09/12/2021  2:28 PM      Failed - Cr in normal range and within 360 days    Creatinine  Date Value Ref Range Status  07/01/2012 1.27 0.60 - 1.30 mg/dL Final   Creatinine, Ser  Date Value Ref Range Status  09/05/2021 1.40 (H) 0.76 - 1.27 mg/dL Final         Failed - Lipid Panel in normal range within the last 12 months    Cholesterol, Total  Date Value Ref Range Status  04/04/2021 101 100 - 199 mg/dL Final   LDL Chol Calc (NIH)  Date Value Ref Range Status  04/04/2021 43 0 - 99 mg/dL Final   HDL  Date Value Ref Range Status  04/04/2021 40 >39 mg/dL Final   Triglycerides  Date Value Ref Range Status  04/04/2021 96 0 - 149 mg/dL Final         Passed - Patient is not pregnant      Passed - Valid encounter within last 12 months    Recent Outpatient Visits          1 week ago Shortness of breath   [place], Santiago Glad, NP   3 weeks ago Fatigue, unspecified type   [place], Jolene T, NP   1 month ago Type 2 diabetes mellitus with diabetic autonomic neuropathy, without long-term current use of insulin ([place])   [place], [place] T, NP   2 months ago Diabetes mellitus with proteinuria ([place])   [place], Jolene T, NP   3 months ago Type 2 diabetes mellitus with diabetic autonomic neuropathy, without long-term current use of insulin ([place])   [place], [name], NP      Future Appointments            In 2 weeks [name], [name], NP [facility], [facility]   In 3 weeks [place], Barbera Setters, NP [employer], [place]   In 4 months Jonathon Bellows, MD [place]

## 2021-09-16 ENCOUNTER — Telehealth: Payer: Self-pay | Admitting: Cardiovascular Disease

## 2021-09-16 DIAGNOSIS — R0602 Shortness of breath: Secondary | ICD-10-CM

## 2021-09-16 NOTE — Telephone Encounter (Signed)
Spoke w/ pt.  He reports that he was recently seen and mentioned, but did not stress that he has been having SOBOE. He is s/p PCI, on Eliquis & Plavix. At last ov, his amio was d/c'd and he started metoprolol succinate 25 mg once daily.  He reports that his HR has been much better since this change. His BP has been running high in the am (170/93 this am) and comes down after he takes his meds.  He reports that 2-3 nights ago, his BP was elevated, so he took his old benazepril 40 mg to get it down (this is not on med list, d/c'd in Nov 22). He has lost wt over the last few months, down from 220 to 187. He previously used a CPAP machine, has not used for years, but got a new machine last week and has been using it nightly since picking it up. Pt is audibly SOB on the phone after walking into the kitchen to read me his med dosages.  He was supposed to take his wife to a dr appt today, but will have his daughter do so.  Advised him that I will make Dr. Fletcher Anon aware of his concerns and call him back w/ his recommendation.  He is appreciative of the call.

## 2021-09-16 NOTE — Telephone Encounter (Signed)
STAT if patient feels like he/she is going to faint   Are you dizzy now? No (he is sitting down now) he is only dizzy when he is up moving around   Do you feel faint or have you passed out? no  Do you have any other symptoms? His heart starts pounding  Have you checked your HR and BP (record if available)? 134/72 HR 72

## 2021-09-16 NOTE — Telephone Encounter (Signed)
Patient made aware of Dr. Tyrell Antonio response and recommendation. Patient is agreeable with the referral to pulmonology. Adv the pt that their office will contact him to schedule. Patient will f/u with cardiology as scheduled. Adv the pt to contact the office sooner if assistance is needed.

## 2021-09-16 NOTE — Telephone Encounter (Signed)
Continue same medications for now. Both recent cardiac catheterization and echocardiogram were unremarkable.  Thus, it is highly possible that his shortness of breath might be related to something else other than the heart.  I recommend referral to pulmonary clinic for evaluation.

## 2021-09-19 ENCOUNTER — Ambulatory Visit: Payer: Medicare Other | Admitting: Nurse Practitioner

## 2021-09-19 DIAGNOSIS — E119 Type 2 diabetes mellitus without complications: Secondary | ICD-10-CM | POA: Diagnosis not present

## 2021-09-19 DIAGNOSIS — Z961 Presence of intraocular lens: Secondary | ICD-10-CM | POA: Diagnosis not present

## 2021-09-19 LAB — HM DIABETES EYE EXAM

## 2021-09-22 ENCOUNTER — Encounter: Payer: Self-pay | Admitting: Student in an Organized Health Care Education/Training Program

## 2021-09-22 ENCOUNTER — Ambulatory Visit
Payer: Medicare Other | Attending: Student in an Organized Health Care Education/Training Program | Admitting: Student in an Organized Health Care Education/Training Program

## 2021-09-22 VITALS — BP 122/86 | HR 83 | Temp 97.3°F | Resp 20 | Ht 73.0 in | Wt 187.0 lb

## 2021-09-22 DIAGNOSIS — M47812 Spondylosis without myelopathy or radiculopathy, cervical region: Secondary | ICD-10-CM | POA: Diagnosis not present

## 2021-09-22 DIAGNOSIS — M542 Cervicalgia: Secondary | ICD-10-CM

## 2021-09-22 DIAGNOSIS — G894 Chronic pain syndrome: Secondary | ICD-10-CM

## 2021-09-22 NOTE — Progress Notes (Signed)
Safety precautions to be maintained throughout the outpatient stay will include: orient to surroundings, keep bed in low position, maintain call bell within reach at all times, provide assistance with transfer out of bed and ambulation.  Patient comes into clinic stating he is short of breath.  States he has been having PVC's.  VSS. Instructed patient that he should be going to the ED but he refuses stating that this is nothing new and he is being seen by cardiologist and pulmonologist for these issues.  Palpated pulse and it was regular.  Dr Holley Raring notified.

## 2021-09-22 NOTE — Progress Notes (Signed)
PROVIDER NOTE: Information contained herein reflects review and annotations entered in association with encounter. Interpretation of such information and data should be left to medically-trained personnel. Information provided to patient can be located elsewhere in the medical record under "Patient Instructions". Document created using STT-dictation technology, any transcriptional errors that may result from process are unintentional.    Patient: Sean Reyes.  Service Category: E/M  Provider: Gillis Santa, MD  DOB: 08/12/1940  DOS: 09/22/2021  Referring Provider: [name], NP  MRN: [id number]  Specialty: Interventional Pain Management  PCP: [name], NP  Type: Established Patient  Setting: Ambulatory outpatient    Location: Office  Delivery: Face-to-face     HPI  [name]., a 81 y.o. year old male, is here today because of his Cervicalgia [M54.2]. [name] primary complain today is Neck Pain, Wrist Pain, and Back Pain  Pertinent problems: [name] has H/O prior ablation treatment; AF (paroxysmal atrial fibrillation) ([place]); Spinal stenosis, lumbar region, with neurogenic claudication; HNP (herniated nucleus pulposus), lumbar; Failed back surgical syndrome; Postlaminectomy syndrome, lumbar region; History of fusion of lumbar spine (L2-L5); Chronic radicular lumbar pain; and Spinal cord stimulator status on their pertinent problem list. Pain Assessment: Severity of Chronic pain is reported as a 2 /10. Location: Neck  /denies. Onset: More than a month ago. Quality:  (popping and grinding). Timing: Constant. Modifying factor(s): nothing. Vitals:  height is $RemoveB'6\' 1"'zcWQOASM$  (1.854 m) and weight is 187 lb (84.8 kg). His temperature is 97.3 F (36.3 C) (abnormal). His blood pressure is 122/86 and his pulse is 83. His respiration is 20 and oxygen saturation is 100%.   Reason for encounter: evaluation of worsening, or previously known (established) problem.   [name] presents today  with increased cervical spine pain as well as wrist pain and lower back pain.  He has a history of lumbar spinal stenosis, lumbar postlaminectomy pain syndrome for which she has a Nevro spinal cord stimulator.  He last saw me for his cervical spine pain related to cervical facet arthropathy and cervicalgia.  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  He has been dealing with shortness of breath which was related to his cardiac medications.  Since adjustment, he states that his shortness of breath and his functional status has improved although he is still fairly deconditioned.  He states that his quality of life is poor and that he does develop shortness of breath with minimal exertion.  We discussed cervical trigger point injections for his cervicalgia.  Risk and benefits were reviewed and patient states that he will give [place] a call if he would like to pursue this.  Do not recommend any deep cervical facet medial branch nerve blocks at this time as the patient would need to stop his Plavix for the procedure which I do not think is a good idea.  ROS  Constitutional: Denies any fever or chills Gastrointestinal: No reported hemesis, hematochezia, vomiting, or acute GI distress Musculoskeletal:  Cervicalgia Neurological: No reported episodes of acute onset apraxia, aphasia, dysarthria, agnosia, amnesia, paralysis, loss of coordination, or loss of consciousness  Medication Review  acetaminophen, apixaban, clopidogrel, cyanocobalamin, ferrous sulfate, finasteride, isosorbide mononitrate, metFORMIN, metoprolol succinate, omeprazole, pregabalin, rosuvastatin, solifenacin, tamsulosin, and traMADol  History Review  Allergy: [name] is allergic to levaquin [levofloxacin in d5w], shellfish allergy, amiodarone, and adhesive [tape]. Drug: [name]  reports no history  of drug use. Alcohol:  reports no history  of alcohol use. Tobacco:  reports that he has never smoked. He has never used smokeless tobacco. Social: [name]  reports that he has never smoked. He has never used smokeless tobacco. He reports that he does not drink alcohol and does not use drugs. Medical:  has a past medical history of Anemia, Anxiety, Arthritis, Arthritis of neck, Atrial fibrillation ([place]), Cataracts, bilateral, Complication of anesthesia, Depression, Diabetes ([place]), Dysrhythmia, GERD (gastroesophageal reflux disease), History of kidney stones, HOH (hard of hearing), Hyperlipidemia, Hypertension, Myocardial infarction ([place]) (12/2020), Nocturia, S/P ablation of atrial fibrillation, Sleep apnea, Spinal stenosis, and Tachycardia, unspecified. Surgical: [name]  has a past surgical history that includes Tonsillectomy; Hernia repair; Ablation; Colonoscopy with propofol (N/A, 10/05/2015); Lumbar laminectomy/decompression microdiscectomy (Left, 09/13/2016); Anterior lat lumbar fusion (N/A, 06/27/2017); Joint replacement (Bilateral); Back surgery; Esophagogastroduodenoscopy (egd) with propofol (N/A, 04/01/2018); Cardioversion (N/A, 08/29/2018); Cardioversion (N/A, 09/24/2018); Spinal cord stimulator insertion (07/08/2019); Colonoscopy with propofol (N/A, 11/01/2020); Esophagogastroduodenoscopy (N/A, 11/01/2020); LEFT HEART CATH AND CORONARY ANGIOGRAPHY (N/A, 12/22/2020); CORONARY STENT INTERVENTION (N/A, 12/22/2020); LEFT HEART CATH AND CORONARY ANGIOGRAPHY (Left, 08/23/2021); and Cardiac catheterization. Family: family history includes Brain cancer in his mother.  Laboratory Chemistry Profile   Renal Lab Results  Component Value Date   BUN 19 09/05/2021   CREATININE 1.40 (H) 09/05/2021   BCR 14 09/05/2021   GFRAA 64 02/26/2020   GFRNONAA 56 (L) 08/05/2021    Hepatic Lab Results  Component Value Date   AST 17 09/05/2021   ALT 16 09/05/2021   ALBUMIN 4.0 09/05/2021   ALKPHOS 72 09/05/2021   LIPASE 25 12/19/2018     Electrolytes Lab Results  Component Value Date   NA 136 09/05/2021   K 4.4 09/05/2021   CL 98 09/05/2021   CALCIUM 9.6 09/05/2021   MG 1.6 04/25/2021    Bone Lab Results  Component Value Date   VD25OH 39.8 04/25/2021   TESTOFREE 5.6 (L) 06/17/2021   TESTOSTERONE 400 06/17/2021    Inflammation (CRP: Acute Phase) (ESR: Chronic Phase) No results found for: "CRP", "ESRSEDRATE", "LATICACIDVEN"       Note: Above Lab results reviewed.  Recent Imaging Review  ECHOCARDIOGRAM COMPLETE    ECHOCARDIOGRAM REPORT       Patient Name:   Boden Stucky. Date of Exam: 09/07/2021 Medical Rec #:  [id number]        Height:       73.0 in Accession #:    [id number]       Weight:       184.2 lb Date of Birth:  10/02/1940        BSA:          2.078 m Patient Age:    75 years         BP:           140/84 mmHg Patient Gender: M                HR:           78 bpm. Exam Location:  [facility]  Procedure: 2D Echo, Cardiac Doppler and Color Doppler  Indications:    R06.02 SOB; R00.1 Bradycardia, unspecified   History:        Patient has prior history of Echocardiogram examinations, most                 recent 12/21/2020. CAD, PAD, Arrythmias:Bradycardia and Atrial  Fibrillation, Signs/Symptoms:Shortness of Breath; Risk                 Factors:Hypertension, Diabetes, Dyslipidemia and Non-Smoker.   Sonographer:    Pilar Jarvis RDMS, RVT, RDCS Referring Phys: [id number] [name]    Sonographer Comments: Very limited acoustic windows IMPRESSIONS   1. Left ventricular ejection fraction, by estimation, is 60 to 65%. The left ventricle has normal function. The left ventricle has no regional wall motion abnormalities. There is mild left ventricular hypertrophy. Left ventricular diastolic parameters  are consistent with Grade I diastolic dysfunction (impaired relaxation).  2. Right ventricular systolic function is normal. The right ventricular size is normal. Tricuspid regurgitation  signal is inadequate for assessing PA pressure.  3. The mitral valve was not well visualized. Trivial mitral valve regurgitation.  4. The aortic valve was not well visualized. Aortic valve regurgitation is not visualized. No aortic stenosis is present.  5. There is mild dilatation of the ascending aorta, measuring 39 mm.  6. The inferior vena cava is normal in size with greater than 50% respiratory variability, suggesting right atrial pressure of 3 mmHg.  FINDINGS  Left Ventricle: Left ventricular ejection fraction, by estimation, is 60 to 65%. The left ventricle has normal function. The left ventricle has no regional wall motion abnormalities. The left ventricular internal cavity size was normal in size. There is  mild left ventricular hypertrophy. Left ventricular diastolic parameters are consistent with Grade I diastolic dysfunction (impaired relaxation).  Right Ventricle: The right ventricular size is normal. No increase in right ventricular wall thickness. Right ventricular systolic function is normal. Tricuspid regurgitation signal is inadequate for assessing PA pressure.  Left Atrium: Left atrial size was normal in size.  Right Atrium: Right atrial size was normal in size.  Pericardium: There is no evidence of pericardial effusion.  Mitral Valve: The mitral valve was not well visualized. Mild to moderate mitral annular calcification. Trivial mitral valve regurgitation.  Tricuspid Valve: The tricuspid valve is not well visualized. Tricuspid valve regurgitation is not demonstrated.  Aortic Valve: The aortic valve was not well visualized. Aortic valve regurgitation is not visualized. No aortic stenosis is present. Aortic valve mean gradient measures 3.0 mmHg. Aortic valve peak gradient measures 6.1 mmHg. Aortic valve area, by VTI  measures 3.48 cm.  Pulmonic Valve: The pulmonic valve was not well visualized. Pulmonic valve regurgitation is mild. No evidence of pulmonic  stenosis.  Aorta: The aortic root is normal in size and structure. There is mild dilatation of the ascending aorta, measuring 39 mm.  Pulmonary Artery: The pulmonary artery is not well seen.  Venous: The inferior vena cava is normal in size with greater than 50% respiratory variability, suggesting right atrial pressure of 3 mmHg.  IAS/Shunts: No atrial level shunt detected by color flow Doppler.    LEFT VENTRICLE PLAX 2D LVIDd:         3.60 cm     Diastology LVIDs:         1.80 cm     LV e' medial:    6.31 cm/s LV PW:         1.20 cm     LV E/e' medial:  9.2 LV IVS:        1.40 cm     LV e' lateral:   3.48 cm/s LVOT diam:     2.00 cm     LV E/e' lateral: 16.6 LV SV:         80 LV SV Index:  38 LVOT Area:     3.14 cm   LV Volumes (MOD) LV vol d, MOD A2C: 96.7 ml LV vol d, MOD A4C: 91.4 ml LV vol s, MOD A2C: 37.9 ml LV vol s, MOD A4C: 43.1 ml LV SV MOD A2C:     58.8 ml LV SV MOD A4C:     91.4 ml LV SV MOD BP:      57.3 ml  RIGHT VENTRICLE             IVC RV Basal diam:  4.01 cm     IVC diam: 1.70 cm RV S prime:     14.00 cm/s TAPSE (M-mode): 1.9 cm  LEFT ATRIUM             Index        RIGHT ATRIUM           Index LA diam:        4.00 cm 1.93 cm/m   RA Area:     14.02 cm LA Vol (A2C):   68.3 ml 32.87 ml/m  RA Volume:   32.81 ml  15.79 ml/m LA Vol (A4C):   52.5 ml 25.27 ml/m LA Biplane Vol: 65.7 ml 31.62 ml/m  AORTIC VALVE                    PULMONIC VALVE AV Area (Vmax):    3.24 cm     PV Vmax:       1.13 m/s AV Area (Vmean):   3.37 cm     PV Peak grad:  5.1 mmHg AV Area (VTI):     3.48 cm AV Vmax:           123.00 cm/s AV Vmean:          83.400 cm/s AV VTI:            0.229 m AV Peak Grad:      6.1 mmHg AV Mean Grad:      3.0 mmHg LVOT Vmax:         127.00 cm/s LVOT Vmean:        89.400 cm/s LVOT VTI:          0.254 m LVOT/AV VTI ratio: 1.11   AORTA Ao Root diam: 3.60 cm Ao Asc diam:  3.90 cm Ao Arch diam: 2.8 cm  MITRAL VALVE MV Area (PHT): 2.02  cm    SHUNTS MV Decel Time: 375 msec    Systemic VTI:  0.25 m MV E velocity: 57.80 cm/s  Systemic Diam: 2.00 cm MV A velocity: 63.40 cm/s MV E/A ratio:  0.91  Harrell Gave End MD Electronically signed by Nelva Bush MD Signature Date/Time: 09/08/2021/7:29:16 AM      Final   Note: Reviewed        Physical Exam  General appearance: Well nourished, well developed, and well hydrated. In no apparent acute distress Mental status: Alert, oriented x 3 (person, place, & time)       Respiratory: No evidence of acute respiratory distress Eyes: PERLA Vitals: BP 122/86   Pulse 83 Comment: Pulse is regular, states he is having some SOB. Dr Holley Raring notified  Temp (!) 97.3 F (36.3 C)   Resp 20   Ht _0  (1.854 m)   Wt 187 lb (84.8 kg)   SpO2 100%   BMI 24.67 kg/m  BMI: Estimated body mass index is 24.67 kg/m as calculated from the following:   Height as of this encounter: _1  (1.854  m).   Weight as of this encounter: 187 lb (84.8 kg). Ideal: Ideal body weight: 79.9 kg (176 lb 2.4 oz) Adjusted ideal body weight: 81.9 kg (180 lb 7.8 oz)  Cervicalgia, limited range of motion of cervical spine  Assessment   Diagnosis Status  1. Cervicalgia   2. Cervical facet joint syndrome   3. Chronic pain syndrome    Persistent Persistent Persistent   Updated Problems: Problem  Cervical Facet Joint Syndrome  Cervicalgia    Plan of Care    Orders:  Orders Placed This Encounter  Procedures   TRIGGER POINT INJECTION    Standing Status:   Standing    Number of Occurrences:   3    Standing Expiration Date:   01/22/2022    Scheduling Instructions:     Cervicalgia cervical TPI- pt will call to schedule    Order Specific Question:   Where will this procedure be performed?    Answer:   [facility]   Follow-up plan:   Return for Cervicalgia cervical TPI- pt will call to schedule.     Status post caudal 02/03/2019: 6 cc injected- not helpful, status post Nevro spinal cord  stimulator implant with Dr. Mearl Latin at [facility] Visits No visits were found meeting these conditions. Showing recent visits within past 90 days and meeting all other requirements Today's Visits Date Type Provider Dept  09/22/21 Office Visit Sean Santa, MD [facility]  Showing today's visits and meeting all other requirements Future Appointments No visits were found meeting these conditions. Showing future appointments within next 90 days and meeting all other requirements  I discussed the assessment and treatment plan with the patient. The patient was provided an opportunity to ask questions and all were answered. The patient agreed with the plan and demonstrated an understanding of the instructions.  Patient advised to call back or seek an in-person evaluation if the symptoms or condition worsens.  Duration of encounter: 20 minutes.  Total time on encounter, as per AMA guidelines included both the face-to-face and non-face-to-face time personally spent by the physician and/or other qualified health care professional(s) on the day of the encounter (includes time in activities that require the physician or other qualified health care professional and does not include time in activities normally performed by clinical staff). Physician's time may include the following activities when performed: preparing to see the patient (eg, review of tests, pre-charting review of records) obtaining and/or reviewing separately obtained history performing a medically appropriate examination and/or evaluation counseling and educating the patient/family/caregiver ordering medications, tests, or procedures referring and communicating with other health care professionals (when not separately reported) documenting clinical information in the electronic or other health record independently interpreting results (not separately reported) and communicating results to the patient/  family/caregiver care coordination (not separately reported)  Note by: Sean Santa, MD Date: 09/22/2021; Time: 9:39 AM

## 2021-09-23 ENCOUNTER — Telehealth: Payer: Medicare Other

## 2021-09-23 ENCOUNTER — Ambulatory Visit: Payer: Self-pay

## 2021-09-23 NOTE — Patient Instructions (Signed)
Visit Information  Thank you for taking time to visit with me today. Please don't hesitate to contact me if I can be of assistance to you.   Following are the goals we discussed today:   Goals Addressed             This Visit's Progress    RNCM: Effective Management of Heart Health       Care Coordination Interventions: BP Readings from Last 3 Encounters:  09/22/21 122/86  09/08/21 125/78  09/05/21 (!) 141/83    Provided education on importance of blood pressure control in management of CAD. 09-23-2021: The patient has switched to a new cardiologist and is much happier with this cardiologist. The patient is finishing a 14 day trial with wearing a heart monitor and sends it in the mail back to the company. Will follow up on 10-06-2021 with the cardiologist for review and recommendations. Had some medication changes and his heart rate has stabilized. He is happy about this. He is still having shortness of breath and some PVCs with activity and this is frustrating. He has follow up with pulmonary on 09-28-2021 Provided education on Importance of limiting foods high in cholesterol. 09-23-2021: The patient is on a heart healthy diet and is mindful of what he eats. Counseled on importance of regular laboratory monitoring as prescribed. 09-23-2021: Has regular lab testing done Counseled on the importance of exercise goals with target of 150 minutes per week. 09-23-2021: Limited in his ability to do exercises due to back pain and discomfort. Education on staying active and pacing activity  Reviewed Importance of taking all medications as prescribed. 09-23-2021: Is compliant with medications Reviewed Importance of attending all scheduled provider appointments. 09-23-2021: Has several upcoming appointments. The patient states he will see cardiology again on 10-06-2021, appointment with pcp on 10-03-2021 Advised to report any changes in symptoms or exercise tolerance Advised patient to discuss changes in the  way he feels, increase shortness of breath, questions or concerns with provider Screening for signs and symptoms of depression related to chronic disease state Assessed social determinant of health barriers AWV completed on 09-12-2021      RNCM: Effective Management of Pain       Care Coordination Interventions:  09-23-2021: Rates his back pain at a 7 today on a scale of 0-10 Reviewed provider established plan for pain management. 09-23-2021: Saw the pain specialist yesterday and was not having neck pain. The patient was told if he experiences the neck pain again to call and they will work him in to get an injection. He states he continues to use his crutch when ambulating. Review of safety and fall risk. Discussed importance of adherence to all scheduled medical appointments. Sees pcp on 10-03-2021 Counseled on the importance of reporting any/all new or changed pain symptoms or management strategies to pain management provider Advised patient to report to care team affect of pain on daily activities Discussed use of relaxation techniques and/or diversional activities to assist with pain reduction (distraction, imagery, relaxation, massage, acupressure, TENS, heat, and cold application Reviewed with patient prescribed pharmacological and nonpharmacological pain relief strategies Advised patient to discuss changes in level or intensity of pain or unresolved pain with provider Has a pain stimulator for back pain and discomfort. Can adjust settings as needed for pain relief           Our next appointment is by telephone on 11-18-2021 at 0900  Please call the care guide team at [phone] if you need to  cancel or reschedule your appointment.   If you are experiencing a Mental Health or [place] or need someone to talk to, please call the Suicide and Crisis Lifeline: 988 call the [place] National Suicide Prevention Lifeline: 816-338-9549 or TTY: (905) 794-5201 TTY 256-314-5549) to  talk to a trained counselor call 1-800-273-TALK (toll free, 24 hour hotline)  Patient verbalizes understanding of instructions and care plan provided today and agrees to view in [place]. Active MyChart status and patient understanding of how to access instructions and care plan via MyChart confirmed with patient.     Telephone follow up appointment with care management team member scheduled for: 11-18-2021 at 0900 am  [place], MSN, [facility] Mobile: [phone]

## 2021-09-23 NOTE — Patient Outreach (Signed)
Care Coordination   Follow Up Visit Note   09/23/2021 Name: Sean Reyes. MRN: [id number] DOB: 22-May-1940  Sean Reyes. is a 81 y.o. year old male who sees [place], Henrine Screws T, NP for primary care. I spoke with  Sean Reyes. by phone today  What matters to the patients health and wellness today?  Why he is having continued shortness of breath? Pain is at a 7 today,  may have to adjust the pain stimulator    Goals Addressed             This Visit's Progress    RNCM: Effective Management of Heart Health       Care Coordination Interventions: BP Readings from Last 3 Encounters:  09/22/21 122/86  09/08/21 125/78  09/05/21 (!) 141/83    Provided education on importance of blood pressure control in management of CAD. 09-23-2021: The patient has switched to a new cardiologist and is much happier with this cardiologist. The patient is finishing a 14 day trial with wearing a heart monitor and sends it in the mail back to the company. Will follow up on 10-06-2021 with the cardiologist for review and recommendations. Had some medication changes and his heart rate has stabilized. He is happy about this. He is still having shortness of breath and some PVCs with activity and this is frustrating. He has follow up with pulmonary on 09-28-2021 Provided education on Importance of limiting foods high in cholesterol. 09-23-2021: The patient is on a heart healthy diet and is mindful of what he eats. Counseled on importance of regular laboratory monitoring as prescribed. 09-23-2021: Has regular lab testing done Counseled on the importance of exercise goals with target of 150 minutes per week. 09-23-2021: Limited in his ability to do exercises due to back pain and discomfort. Education on staying active and pacing activity  Reviewed Importance of taking all medications as prescribed. 09-23-2021: Is compliant with medications Reviewed Importance of attending all scheduled provider appointments. 09-23-2021:  Has several upcoming appointments. The patient states he will see cardiology again on 10-06-2021, appointment with pcp on 10-03-2021 Advised to report any changes in symptoms or exercise tolerance Advised patient to discuss changes in the way he feels, increase shortness of breath, questions or concerns with provider Screening for signs and symptoms of depression related to chronic disease state Assessed social determinant of health barriers AWV completed on 09-12-2021      RNCM: Effective Management of Pain       Care Coordination Interventions:  09-23-2021: Rates his back pain at a 7 today on a scale of 0-10 Reviewed provider established plan for pain management. 09-23-2021: Saw the pain specialist yesterday and was not having neck pain. The patient was told if he experiences the neck pain again to call and they will work him in to get an injection. He states he continues to use his crutch when ambulating. Review of safety and fall risk. Discussed importance of adherence to all scheduled medical appointments. Sees pcp on 10-03-2021 Counseled on the importance of reporting any/all new or changed pain symptoms or management strategies to pain management provider Advised patient to report to care team affect of pain on daily activities Discussed use of relaxation techniques and/or diversional activities to assist with pain reduction (distraction, imagery, relaxation, massage, acupressure, TENS, heat, and cold application Reviewed with patient prescribed pharmacological and nonpharmacological pain relief strategies Advised patient to discuss changes in level or intensity of pain or unresolved pain with provider Has a pain  stimulator for back pain and discomfort. Can adjust settings as needed for pain relief           SDOH assessments and interventions completed:  Yes  SDOH Interventions Today    Flowsheet Row Most Recent Value  SDOH Interventions   Physical Activity Interventions Other  (Comments)  [gets easily short of breath with any activity, the patient working with specialist]        Care Coordination Interventions Activated:  Yes  Care Coordination Interventions:  Yes, provided   Follow up plan: Follow up call scheduled for 11-18-2021 at 0900 am    Encounter Outcome:  Pt. Visit Completed   Noreene Larsson RN, MSN, [facility] Mobile: [phone]

## 2021-09-26 ENCOUNTER — Ambulatory Visit: Payer: Medicare Other | Admitting: Cardiovascular Disease

## 2021-09-28 ENCOUNTER — Ambulatory Visit (INDEPENDENT_AMBULATORY_CARE_PROVIDER_SITE_OTHER): Payer: Medicare Other | Admitting: Student in an Organized Health Care Education/Training Program

## 2021-09-28 ENCOUNTER — Encounter: Payer: Self-pay | Admitting: Student in an Organized Health Care Education/Training Program

## 2021-09-28 VITALS — BP 124/62 | HR 73 | Temp 97.9°F | Ht 73.0 in | Wt 185.2 lb

## 2021-09-28 DIAGNOSIS — R0602 Shortness of breath: Secondary | ICD-10-CM

## 2021-09-28 DIAGNOSIS — R600 Localized edema: Secondary | ICD-10-CM | POA: Diagnosis not present

## 2021-09-28 NOTE — Progress Notes (Signed)
Synopsis: Referred in for shortness of breath by Wellington Hampshire, MD  Assessment & Plan:   #Shortness of Breath  Patient presenting with shortness of breath that is unspecific at this moment. He reports that his symptoms worsened after his recent MI, but workup by cardiology has been extensive (including a LHC, TTE, cardiac monitor) with no apparent cause. His anti-arrhythmic medications have been switched with the hope of better controlling afib. He does not have risk factors for COPD besides age, and his xray does not show any gross abnormality besides a slightly elevated right hemi-diaphragm.  For workup, I will start with full PFT's to assess spirometry, lung volumes, and DLCO. I will also obtain a lower extremity duplex to assess for any chronic clots (he is on Eliquis). There are case reports of propafenone causing restrictive lung disease but it is not a well known side effect / association. He has received amiodarone in the past but this is not a current or chronic medication. Review of his medication list does not identify a culprit behind his symptoms. Other etiologies that I am considering are an inappropriate chronotropic response, although he appears to have been more tachycardic than bradycardic, and metoprolol has controlled his heart rate well.  Unfortunately, given his chronic lower back and lower extremity pains, he would not be a candidate for a CPET (Cardio Pulmonary Exercise Test) to evaluate the etiology behind his shortness of breath.  I will start with PFT's and lower extremity duplexes and tailor workup depending on findings.   I spent 50 minutes caring for this patient today, including preparing to see the patient, obtaining and/or reviewing separately obtained history, performing a medically appropriate examination and/or evaluation, counseling and educating the patient/family/caregiver, ordering medications, tests, or procedures, and documenting clinical information  in the electronic health record  Return in about 3 weeks (around 10/19/2021).  End of visit medications:  Current Outpatient Medications:    clopidogrel (PLAVIX) 75 MG tablet, Take 75 mg by mouth daily., Disp: , Rfl:    cyanocobalamin 1000 MCG tablet, Take by mouth., Disp: , Rfl:    ELIQUIS 5 MG TABS tablet, Take 1 tablet (5 mg total) by mouth 2 (two) times daily., Disp: 180 tablet, Rfl: 4   ferrous sulfate 325 (65 FE) MG EC tablet, Take 325 mg by mouth daily with breakfast., Disp: , Rfl:    finasteride (PROSCAR) 5 MG tablet, Take 1 tablet (5 mg total) by mouth daily., Disp: 90 tablet, Rfl: 2   isosorbide mononitrate (IMDUR) 30 MG 24 hr tablet, Take 30 mg by mouth daily., Disp: , Rfl:    metFORMIN (GLUCOPHAGE) 500 MG tablet, Take 1 tablet (500 mg total) by mouth 2 (two) times daily with a meal., Disp: 180 tablet, Rfl: 4   metoprolol succinate (TOPROL XL) 25 MG 24 hr tablet, Take 1 tablet (25 mg total) by mouth daily., Disp: 30 tablet, Rfl: 5   omeprazole (PRILOSEC) 40 MG capsule, Take 40 mg by mouth daily., Disp: , Rfl:    rosuvastatin (CRESTOR) 5 MG tablet, Take 1 tablet (5 mg total) by mouth daily., Disp: 90 tablet, Rfl: 0   solifenacin (VESICARE) 5 MG tablet, Take 5 mg by mouth daily., Disp: , Rfl:    tamsulosin (FLOMAX) 0.4 MG CAPS capsule, Take 0.8 mg by mouth daily., Disp: , Rfl:    acetaminophen (TYLENOL) 500 MG tablet, Take 500 mg by mouth every 6 (six) hours as needed (pain). (Patient not taking: Reported on 09/28/2021), Disp: , Rfl:  pregabalin (LYRICA) 25 MG capsule, Take 1 capsule (25 mg total) by mouth 2 (two) times daily. (Patient not taking: Reported on 09/28/2021), Disp: 180 capsule, Rfl: 2   traMADol (ULTRAM) 50 MG tablet, Take 50 mg by mouth 2 (two) times daily as needed. (Patient not taking: Reported on 09/28/2021), Disp: , Rfl:    Armando Reichert, MD [facility] 09/28/2021 4:29 PM    Subjective:   PATIENT ID: Sean Reyes. GENDER: male DOB:  Nov 19, 1940, MRN: [id number]  Chief Complaint  Patient presents with   pulmonary consult    Per Dr. Fletcher Anon- c/o SOB with exertion and prod cough with clear sputum x62mo.[name] KitchenHx of covid 2021    HPI  Mr. WKalis a pleasant 81year old male presenting for the workup of shortness of breath. He reports that his symptoms mostly started after suffering a heart attack. [facility] and referred to pulmonary for further workup of his shortness of breath.  He tells me that his shortness of breath is mostly with exertion, and at times he feels it at rest. He has significant lower back and lower extremity pain, and has had a pain stimulator placed to control his symptoms. He does not endorse shortness of breath prior to his heart attack. He reports that it is limiting to his activities of daily living and he has not been able to do the things he wants to do. In addition to his CAD (RCA stent, 60% distal LAD), he's had an ablation procedure for Afib twice, with improvement. He was maintained on propafenone and metoprolol following the procedure. He will have a cardiac monitor placed and will follow up for that.  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 Denies any exposure to asbestos.  Ancillary information including prior medications, full medical/surgical/family/social histoies, and PFTs (when available) are listed below and have been reviewed.   Review of Systems  Constitutional:  Negative for chills and fever.  Respiratory:  Positive for shortness of breath. Negative for cough and wheezing.   Cardiovascular:  Positive for palpitations. Negative for chest pain.     Objective:   Vitals:   09/28/21 1133  BP: 124/62  Pulse: 73  Temp: 97.9 F (36.6 C)  TempSrc: Temporal  SpO2: 96%  Weight: 185 lb 3.2 oz  (84 kg)  Height: '6\' 1"'$  (1.854 m)   96% on RA BMI Readings from Last 3 Encounters:  09/28/21 24.43 kg/m  09/22/21 24.67 kg/m  09/08/21 24.41 kg/m   Wt Readings from Last 3 Encounters:  09/28/21 185 lb 3.2 oz (84 kg)  09/22/21 187 lb (84.8 kg)  09/08/21 185 lb (83.9 kg)    Physical Exam Constitutional:      General: He is not in acute distress.    Appearance: He is not ill-appearing.  HENT:     Mouth/Throat:     Mouth: Mucous membranes are moist.  Cardiovascular:     Rate and Rhythm: Normal rate. Rhythm irregular.     Pulses: Normal pulses.     Heart sounds: Normal heart sounds.  Pulmonary:     Effort: Pulmonary effort is normal.     Breath sounds: Normal breath sounds. No wheezing or rales.  Abdominal:     General: Abdomen is flat.     Palpations:  Abdomen is soft.  Musculoskeletal:     Cervical back: Normal range of motion.     Right lower leg: No edema.     Left lower leg: No edema.  Neurological:     General: No focal deficit present.     Mental Status: He is alert and oriented to person, place, and time.       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   Hyperlipidemia    Hypertension    Myocardial infarction ([place]) 12/2020   Nocturia    S/P ablation of atrial fibrillation    Ablative therapy   Sleep apnea    CPAP    Spinal stenosis    Tachycardia, unspecified      Family History  Problem Relation Age of Onset   Brain cancer Mother    Kidney disease Neg Hx    Prostate  cancer Neg Hx    Kidney cancer Neg Hx    Bladder Cancer Neg Hx      Past Surgical History:  Procedure Laterality Date   ABLATION     ANTERIOR LAT LUMBAR FUSION N/A 06/27/2017   Procedure: Anterior Lateral Lumbar Interbody  Fusion - Lumbar Two-Lumbar Three - Lumbar Three-Lumbar Four, Posterior Lumbar Interbody Fusion Lumbar Four- Five;  Surgeon: Kary Kos, MD;  Location: [facility];  Service: Neurosurgery;  Laterality: N/A;  Anterior Lateral Lumbar Interbody  Fusion - Lumbar Two-Lumbar Three - Lumbar Three-Lumbar Four, Posterior Lumbar Interbody Fusion Lumbar Four- Five   BACK SURGERY     CARDIAC CATHETERIZATION     CARDIOVERSION N/A 08/29/2018   Procedure: CARDIOVERSION;  Surgeon: Corey Skains, MD;  Location: [facility];  Service: Cardiovascular;  Laterality: N/A;   CARDIOVERSION N/A 09/24/2018   Procedure: CARDIOVERSION;  Surgeon: Corey Skains, MD;  Location: [facility];  Service: Cardiovascular;  Laterality: N/A;   COLONOSCOPY WITH PROPOFOL N/A 10/05/2015   Procedure: COLONOSCOPY WITH PROPOFOL;  Surgeon: Lollie Sails, MD;  Location[facility]COPY;  Service: Endoscopy;  Laterality: N/A;   COLONOSCOPY WITH PROPOFOL N/A 11/01/2020   Procedure: COLONOSCOPY WITH PROPOFOL;  Surgeon: Jonathon Bellows, MD;  Location: [facility] ENDOSCOPY;  Service: Gastroenterology;  Laterality: N/A;   CORONARY STENT INTERVENTION N/A 12/22/2020   Procedure: CORONARY STENT INTERVENTION;  Surgeon: [name], MD;  Location: [facility];  Service: Cardiovascular;  Laterality: N/A;   ESOPHAGOGASTRODUODENOSCOPY N/A 11/01/2020   Procedure: ESOPHAGOGASTRODUODENOSCOPY (EGD);  Surgeon: Jonathon Bellows, MD;  Location[facility]COPY;  Service: Gastroenterology;  Laterality: N/A;   ESOPHAGOGASTRODUODENOSCOPY (EGD) WITH PROPOFOL N/A 04/01/2018   Procedure: ESOPHAGOGASTRODUODENOSCOPY (EGD) WITH PROPOFOL;  Surgeon: Lollie Sails, MD;  Location: [facility] ENDOSCOPY;  Service: Endoscopy;  Laterality: N/A;   HERNIA  REPAIR     JOINT REPLACEMENT Bilateral    hips  RT+  LEFT X2    LEFT HEART CATH AND CORONARY  ANGIOGRAPHY N/A 12/22/2020   Procedure: LEFT HEART CATH AND CORONARY ANGIOGRAPHY;  Surgeon: [name], MD;  Location: [place] [facility];  Service: Cardiovascular;  Laterality: N/A;   LEFT HEART CATH AND CORONARY ANGIOGRAPHY Left 08/23/2021   Procedure: LEFT HEART CATH AND CORONARY ANGIOGRAPHY;  Surgeon: Corey Skains, MD;  Location: [place] [facility];  Service: Cardiovascular;  Laterality: Left;   LUMBAR LAMINECTOMY/DECOMPRESSION MICRODISCECTOMY Left 09/13/2016   Procedure: Microdiscectomy - Lumbar two-three,  Lumbar three- - left;  Surgeon: Kary Kos, MD;  Location: [facility];  Service: Neurosurgery;  Laterality: Left;   SPINAL CORD STIMULATOR INSERTION  07/08/2019   TONSILLECTOMY      Social History   Socioeconomic History   Marital status: Married    Spouse name: Malachy Mood    Number of children: 2   Years of education: Not on file   Highest education level: High school graduate  Occupational History   Occupation: retired   Tobacco Use   Smoking status: Never   Smokeless tobacco: Never  Vaping Use   Vaping Use: Never used  Substance and Sexual Activity   Alcohol use: No   Drug use: No   Sexual activity: Not on file  Other Topics Concern   Not on file  Social History Narrative   MarriedGets regular exercise.      Lives in [place] with wife/ daughter. Never smoked; no alcohol. 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    Social Determinants of Health   Financial Resource Strain: Low Risk  (09/12/2021)   Overall Financial Resource Strain (CARDIA)    Difficulty of Paying Living Expenses: Not hard at all  Food Insecurity: No Food Insecurity (09/12/2021)   Hunger Vital Sign    Worried About Running Out of Food in the Last Year: Never true    Ran Out of Food in the Last Year: Never true  Transportation Needs: No Transportation  Needs (09/12/2021)   PRAPARE - Hydrologist (Medical): No    Lack of Transportation (Non-Medical): No  Physical Activity: Inactive (09/23/2021)   Exercise Vital Sign    Days of Exercise per Week: 0 days    Minutes of Exercise per Session: 0 min  Stress: No Stress Concern Present (09/12/2021)   [place]    Feeling of Stress : Only a little  Social Connections: Moderately Integrated (09/12/2021)   Social Connection and Isolation Panel [NHANES]    Frequency of Communication with Friends and Family: Three times a week    Frequency of Social Gatherings with Friends and Family: Twice a week    Attends Religious Services: More than 4 times per year    Active Member of [employer] or Organizations: No    Attends Archivist Meetings: Never    Marital Status: Married  Human resources officer Violence: Not At Risk (09/12/2021)   Humiliation, Afraid, Rape, and Kick questionnaire    Fear of Current or Ex-Partner: No    Emotionally Abused: No    Physically Abused: No    Sexually Abused: No     Allergies  Allergen Reactions   Levaquin [Levofloxacin In D5w] Anaphylaxis and Shortness Of Breath   Shellfish Allergy Anaphylaxis    Has used duraprep, betadine and ioban in previous surgeries in 2019 and 2018 without issue   Amiodarone Other (See Comments)    Tremors and thyroid toxicity   Adhesive [Tape] Other (See Comments)  Little red bumps under the dressing.  He questions whether is latex related     CBC    Component Value Date/Time   WBC 5.5 09/05/2021 1529   WBC 6.6 08/05/2021 0732   RBC 3.77 (L) 09/05/2021 1529   RBC 3.63 (L) 08/05/2021 0732   HGB 11.6 (L) 09/05/2021 1529   HCT 34.0 (L) 09/05/2021 1529   PLT 198 09/05/2021 1529   MCV 90 09/05/2021 1529   MCH 30.8 09/05/2021 1529   MCH 30.6 08/05/2021 0732   MCHC 34.1 09/05/2021 1529   MCHC 33.1 08/05/2021 0732   RDW 13.0 09/05/2021 1529    LYMPHSABS 2.0 09/05/2021 1529   MONOABS 0.7 06/08/2021 1245   EOSABS 0.1 09/05/2021 1529   BASOSABS 0.0 09/05/2021 1529    Pulmonary Functions Testing Results:     No data to display          Outpatient Medications Prior to Visit  Medication Sig Dispense Refill   clopidogrel (PLAVIX) 75 MG tablet Take 75 mg by mouth daily.     cyanocobalamin 1000 MCG tablet Take by mouth.     ELIQUIS 5 MG TABS tablet Take 1 tablet (5 mg total) by mouth 2 (two) times daily. 180 tablet 4   ferrous sulfate 325 (65 FE) MG EC tablet Take 325 mg by mouth daily with breakfast.     finasteride (PROSCAR) 5 MG tablet Take 1 tablet (5 mg total) by mouth daily. 90 tablet 2   isosorbide mononitrate (IMDUR) 30 MG 24 hr tablet Take 30 mg by mouth daily.     metFORMIN (GLUCOPHAGE) 500 MG tablet Take 1 tablet (500 mg total) by mouth 2 (two) times daily with a meal. 180 tablet 4   metoprolol succinate (TOPROL XL) 25 MG 24 hr tablet Take 1 tablet (25 mg total) by mouth daily. 30 tablet 5   omeprazole (PRILOSEC) 40 MG capsule Take 40 mg by mouth daily.     rosuvastatin (CRESTOR) 5 MG tablet Take 1 tablet (5 mg total) by mouth daily. 90 tablet 0   solifenacin (VESICARE) 5 MG tablet Take 5 mg by mouth daily.     tamsulosin (FLOMAX) 0.4 MG CAPS capsule Take 0.8 mg by mouth daily.     acetaminophen (TYLENOL) 500 MG tablet Take 500 mg by mouth every 6 (six) hours as needed (pain). (Patient not taking: Reported on 09/28/2021)     pregabalin (LYRICA) 25 MG capsule Take 1 capsule (25 mg total) by mouth 2 (two) times daily. (Patient not taking: Reported on 09/28/2021) 180 capsule 2   traMADol (ULTRAM) 50 MG tablet Take 50 mg by mouth 2 (two) times daily as needed. (Patient not taking: Reported on 09/28/2021)     No facility-administered medications prior to visit.

## 2021-09-28 NOTE — Patient Instructions (Signed)
Today, we discussed your shortness of breath. To investigate, I will start by ordering a breathing test (PFT) in addition to an ultrasound of your legs. I would like to see you back in 3-4 weeks after your breathing test to discuss next steps.

## 2021-09-29 DIAGNOSIS — I480 Paroxysmal atrial fibrillation: Secondary | ICD-10-CM | POA: Diagnosis not present

## 2021-09-30 ENCOUNTER — Other Ambulatory Visit: Payer: Self-pay | Admitting: Nurse Practitioner

## 2021-09-30 NOTE — Telephone Encounter (Signed)
Requested Prescriptions  Pending Prescriptions Disp Refills  . ELIQUIS 5 MG TABS tablet [Pharmacy Med Name: ELIQUIS 5 MG TABLET] 60 tablet 0    Sig: Take 1 tablet (5 mg total) by mouth 2 (two) times daily.     Hematology:  Anticoagulants - apixaban Failed - 09/30/2021 11:09 AM      Failed - HGB in normal range and within 360 days    Hemoglobin  Date Value Ref Range Status  09/05/2021 11.6 (L) 13.0 - 17.7 g/dL Final         Failed - HCT in normal range and within 360 days    Hematocrit  Date Value Ref Range Status  09/05/2021 34.0 (L) 37.5 - 51.0 % Final         Failed - Cr in normal range and within 360 days    Creatinine  Date Value Ref Range Status  07/01/2012 1.27 0.60 - 1.30 mg/dL Final   Creatinine, Ser  Date Value Ref Range Status  09/05/2021 1.40 (H) 0.76 - 1.27 mg/dL Final         Passed - PLT in normal range and within 360 days    Platelets  Date Value Ref Range Status  09/05/2021 198 150 - 450 x10E3/uL Final         Passed - AST in normal range and within 360 days    AST  Date Value Ref Range Status  09/05/2021 17 0 - 40 IU/L Final         Passed - ALT in normal range and within 360 days    ALT  Date Value Ref Range Status  09/05/2021 16 0 - 44 IU/L Final         Passed - Valid encounter within last 12 months    Recent Outpatient Visits          3 weeks ago Shortness of breath   [place], Santiago Glad, NP   1 month ago Fatigue, unspecified type   [place], [place] T, NP   1 month ago Type 2 diabetes mellitus with diabetic autonomic neuropathy, without long-term current use of insulin ([place])   [place], [place] T, NP   3 months ago Diabetes mellitus with proteinuria ([place])   [place], Jolene T, NP   4 months ago Type 2 diabetes mellitus with diabetic autonomic neuropathy, without long-term current use of insulin ([place])   [place], [name],  NP      Future Appointments            In 3 days [name], [name], NP [employer]   In 6 days [place], Barbera Setters, NP [employer], [place]   In 3 weeks Armando Reichert, MD [place]   In 3 months Jonathon Bellows, MD [place]

## 2021-10-01 NOTE — Patient Instructions (Signed)
Shortness of Breath, Adult Shortness of breath means you have trouble breathing. Shortness of breath could be a sign of a medical problem. Follow these instructions at home:  Pollution Do not smoke or use any products that contain nicotine or tobacco. If you need help quitting, ask your doctor. Avoid things that can make it harder to breathe, such as: Smoke of all kinds. This includes smoke from campfires or forest fires. Do not smoke or allow others to smoke in your home. Mold. Dust. Air pollution. Chemical smells. Things that can give you an allergic reaction (allergens) if you have allergies. Keep your living space clean. Use products that help remove mold and dust. General instructions Watch for any changes in your symptoms. Take over-the-counter and prescription medicines only as told by your doctor. This includes oxygen therapy and inhaled medicines. Rest as needed. Return to your normal activities when your doctor says that it is safe. Keep all follow-up visits. Contact a doctor if: Your condition does not get better as soon as expected. You have a hard time doing your normal activities, even after you rest. You have new symptoms. You cannot walk up stairs. You cannot exercise the way you normally do. Get help right away if: Your shortness of breath gets worse. You have trouble breathing when you are resting. You feel light-headed or you faint. You have a cough that is not helped by medicines. You cough up blood. You have pain with breathing. You have pain in your chest, arms, shoulders, or belly (abdomen). You have a fever. These symptoms may be an emergency. Get help right away. Call 911. Do not wait to see if the symptoms will go away. Do not drive yourself to the hospital. Summary Shortness of breath is when you have trouble breathing enough air. It can be a sign of a medical problem. Avoid things that make it hard for you to breathe, such as smoking, pollution,  mold, and dust. Watch for any changes in your symptoms. Contact your doctor if you do not get better or you get worse. This information is not intended to replace advice given to you by your health care provider. Make sure you discuss any questions you have with your health care provider. Document Revised: 09/18/2020 Document Reviewed: 09/18/2020 Elsevier Patient Education  2023 [employer]  

## 2021-10-03 ENCOUNTER — Encounter: Payer: Self-pay | Admitting: Nurse Practitioner

## 2021-10-03 ENCOUNTER — Ambulatory Visit (INDEPENDENT_AMBULATORY_CARE_PROVIDER_SITE_OTHER): Payer: Medicare Other | Admitting: Nurse Practitioner

## 2021-10-03 VITALS — BP 96/58 | HR 86 | Temp 98.0°F | Ht 73.0 in | Wt 183.4 lb

## 2021-10-03 DIAGNOSIS — E1169 Type 2 diabetes mellitus with other specified complication: Secondary | ICD-10-CM | POA: Diagnosis not present

## 2021-10-03 DIAGNOSIS — R0602 Shortness of breath: Secondary | ICD-10-CM

## 2021-10-03 DIAGNOSIS — R7989 Other specified abnormal findings of blood chemistry: Secondary | ICD-10-CM

## 2021-10-03 DIAGNOSIS — E1143 Type 2 diabetes mellitus with diabetic autonomic (poly)neuropathy: Secondary | ICD-10-CM

## 2021-10-03 DIAGNOSIS — G4733 Obstructive sleep apnea (adult) (pediatric): Secondary | ICD-10-CM

## 2021-10-03 DIAGNOSIS — L608 Other nail disorders: Secondary | ICD-10-CM

## 2021-10-03 DIAGNOSIS — R809 Proteinuria, unspecified: Secondary | ICD-10-CM | POA: Diagnosis not present

## 2021-10-03 DIAGNOSIS — I480 Paroxysmal atrial fibrillation: Secondary | ICD-10-CM

## 2021-10-03 DIAGNOSIS — E785 Hyperlipidemia, unspecified: Secondary | ICD-10-CM

## 2021-10-03 DIAGNOSIS — N1831 Chronic kidney disease, stage 3a: Secondary | ICD-10-CM | POA: Diagnosis not present

## 2021-10-03 DIAGNOSIS — G894 Chronic pain syndrome: Secondary | ICD-10-CM

## 2021-10-03 DIAGNOSIS — I25110 Atherosclerotic heart disease of native coronary artery with unstable angina pectoris: Secondary | ICD-10-CM | POA: Diagnosis not present

## 2021-10-03 DIAGNOSIS — R5383 Other fatigue: Secondary | ICD-10-CM

## 2021-10-03 DIAGNOSIS — E1129 Type 2 diabetes mellitus with other diabetic kidney complication: Secondary | ICD-10-CM | POA: Diagnosis not present

## 2021-10-03 DIAGNOSIS — D6869 Other thrombophilia: Secondary | ICD-10-CM

## 2021-10-03 DIAGNOSIS — I739 Peripheral vascular disease, unspecified: Secondary | ICD-10-CM

## 2021-10-03 DIAGNOSIS — I152 Hypertension secondary to endocrine disorders: Secondary | ICD-10-CM

## 2021-10-03 DIAGNOSIS — E1159 Type 2 diabetes mellitus with other circulatory complications: Secondary | ICD-10-CM

## 2021-10-03 LAB — BAYER DCA HB A1C WAIVED: HB A1C (BAYER DCA - WAIVED): 6.4 % — ABNORMAL HIGH (ref 4.8–5.6)

## 2021-10-03 NOTE — Assessment & Plan Note (Signed)
Chronic, ongoing.  Continue current medication regimen, tolerating 5 MG Crestor well without ADR, and adjust as needed.  Lipid panel up to date.

## 2021-10-03 NOTE — Assessment & Plan Note (Signed)
Chronic, ongoing.  Followed by neurosurgery and pain management as needed.  Continue this collaboration and current regimen. 

## 2021-10-03 NOTE — Assessment & Plan Note (Signed)
Waxes and wanes -- recheck today and initiate medication as needed. 

## 2021-10-03 NOTE — Assessment & Plan Note (Signed)
Chronic, ongoing with A1c 6.4% today and urine ALB 80 (February 2023).  Decreased sensation bilateral feet, monitor closely for wounds and falls.  Continue current medication regimen and adjust as needed.  Would benefit from return to Benazepril in future or alternate ACE or ARB for kidney protection with proteinuria, however BP on lower side at this time and will defer addition of this.  Continue to monitor BS daily and document for visits.

## 2021-10-03 NOTE — Assessment & Plan Note (Signed)
Chronic, ongoing with A1c 6.4% today and urine ALB 80 (February 2023).  Significant decreased sensation bilateral feet, monitor closely for wounds and falls.  Continue current diabetes medication regimen and adjust as needed. He did not get benefit from Lyrica or Gabapentin, will continue collaboration with neurology and pain clinic.  Continue to monitor BS daily and document for visits.

## 2021-10-03 NOTE — Assessment & Plan Note (Signed)
Ongoing issue with worsening post NSTEMI.  Suspect multifactorial with main issue being need for new CPAP and decompensation. Discussed at length with him today. Continue to collaborate with cardiology and pulmonary on this more complex case.

## 2021-10-03 NOTE — Assessment & Plan Note (Signed)
Chronic, stable with BP on lower side today.  A1c today 6.4%.  Continue Metformin and current cardiac medications.  Will continue collaboration with cardiology, appreciate their input.  Recommend he continue to check BP at home at least 3 mornings a week and document.  DASH diet.

## 2021-10-03 NOTE — Progress Notes (Addendum)
BP (!) 96/58   Pulse 86   Temp 98 F (36.7 C) (Oral)   Ht '6\' 1"'$  (1.854 m)   Wt 183 lb 6.4 oz (83.2 kg)   SpO2 97%   BMI 24.20 kg/m    Subjective:    Patient ID: Sean Reyes., male    DOB: [birth year], 81 y.o.   MRN: [id number]  HPI: Sean Reyes. is a 81 y.o. male  Chief Complaint  Patient presents with   Diabetes   Hyperlipidemia   Hypertension   Atrial Fibrillation    Patient says he feels his heart is out of rhythm and he is having issues with breathing with walking and moving around a lot and this has been an ongoing issue. Patient says he went and seen his Cardiologist about 2 weeks ago. Patient says he think it has gotten worse and he was informed that he has a PVC.    Chronic Kidney Disease   Peripheral Neuropathy   Nail Problem    Patient daughter says patient has a spot on his toenail and first noticed it about 2 to 3 weeks ago.    Daughter at bedside to assist with HPI  DIABETES WITH NEUROPATHY A1c 7.1% May 2023.  Continues Metformin 500 MG BID. Hypoglycemic episodes:no Polydipsia/polyuria: no Visual disturbance: no Chest pain: no Paresthesias: no Glucose Monitoring: yes  Accucheck frequency: Daily  Fasting glucose: 254 yesterday morning with pizza, 150 something this morning  Post prandial:  Evening:  Before meals: Taking Insulin?: no  Long acting insulin:  Short acting insulin: Blood Pressure Monitoring: daily Retinal Examination: Up To Date -- Dr. Atilano Median in [place] Exam: Up to Date Pneumovax: Up to Date Influenza: Up to Date Aspirin: no    HYPERTENSION / HYPERLIPIDEMIA/A-FIB Saw cardiology last with [name] on 09/08/21 = they discontinue Propafenone and started Metoprolol Tartrate 25 MG BID + ordered 2 week Zio monitor -- he reports improvement in HR with medication changes.  Continues to have SOB, which is not suspected to be cardiac related -- they suspect more physical deconditioning.  A referral to pulmonary was placed and he visited  with them on 09/28/21. Taking Rosuvastatin 5 MG + Imdur.   Underwent PCI with DES to the proximal RCA 12/22/20 due to NSTEMI and last cardiac rehab visit was 05/18/21.  To take Plavix and Eliquis. Satisfied with current treatment? yes Duration of hypertension: chronic BP monitoring frequency: a few times a week BP range: 110-130/80's BP medication side effects: no Duration of hyperlipidemia: chronic Cholesterol medication side effects: no Cholesterol supplements: none Medication compliance: good compliance Aspirin: no Recent stressors: no Recurrent headaches: no Visual changes: no Palpitations: yes Dyspnea: no Chest pain: no Lower extremity edema: no Dizzy/lightheaded: occasional  CHRONIC KIDNEY DISEASE He is seeing urology and last saw 05/25/21 -- continues Tamsulosin and Vesicare. CKD status: stable Medications renally dose: yes Previous renal evaluation: no Pneumovax:  Up to Date Influenza Vaccine:  Up to Date  Lab Results  Component Value Date   CREATININE 1.40 (H) 09/05/2021   CREATININE 1.30 (H) 08/05/2021   CREATININE 1.28 (H) 08/04/2021    CHRONIC PAIN  Adjustments to pain stimulator on 03/21/21. Has ongoing neuropathy issues -- difficulty with balance due to this.  Saw  neurosurgery on 06/10/21 for ongoing weakness/neuropathy, started Lyrica by PCP last visit at 25 MG and is tolerating well but has noted no neuropathy changes -- stopped taking this.  In past tried Gabapentin, but this offered no benefit.  Did  visit with pain management on 09/22/21 with trigger point injection given.  Has Tramadol, not using. Pain control status: stable Duration: years Location: back Quality: dull, aching, and throbbing Current Pain Level: moderate Previous Pain Level: moderate Breakthrough pain: no Benefit from narcotic medications:  not checking What Activities task can be accomplished with current medication? yes Interested in weaning off narcotics:no   Stool softners/OTC fiber: no   Previous pain specialty evaluation: yes Non-narcotic analgesic meds: no Narcotic contract: no  TOENAIL ISSUE: His daughter reports patient has a spot to his right foot, 3rd toe.  Area has grown in size, darker in color.  Has been present about a couple months.  Denies any pain to area.  His sister had removal of melanoma = face and chest.   Relevant past medical, surgical, family and social history reviewed and updated as indicated. Interim medical history since our last visit reviewed. Allergies and medications reviewed and updated.  Review of Systems  Constitutional:  Negative for activity change, appetite change, diaphoresis, fatigue and fever.  Respiratory:  Negative for cough, chest tightness, shortness of breath and wheezing.   Cardiovascular:  Negative for chest pain, palpitations and leg swelling.  Gastrointestinal: Negative.   Endocrine: Negative for cold intolerance, heat intolerance, polydipsia, polyphagia and polyuria.  Neurological: Negative.   Psychiatric/Behavioral: Negative.     Per HPI unless specifically indicated above     Objective:    BP (!) 96/58   Pulse 86   Temp 98 F (36.7 C) (Oral)   Ht '6\' 1"'$  (1.854 m)   Wt 183 lb 6.4 oz (83.2 kg)   SpO2 97%   BMI 24.20 kg/m   Wt Readings from Last 3 Encounters:  10/03/21 183 lb 6.4 oz (83.2 kg)  09/28/21 185 lb 3.2 oz (84 kg)  09/22/21 187 lb (84.8 kg)    Physical Exam Vitals and nursing note reviewed.  Constitutional:      General: He is awake. He is not in acute distress.    Appearance: He is well-developed and well-groomed. He is not ill-appearing.  HENT:     Head: Normocephalic and atraumatic.     Right Ear: Hearing normal. No drainage.     Left Ear: Hearing normal. No drainage.  Eyes:     General: Lids are normal.        Right eye: No discharge.        Left eye: No discharge.     Conjunctiva/sclera: Conjunctivae normal.     Pupils: Pupils are equal, round, and reactive to light.  Neck:      Thyroid: No thyromegaly.     Vascular: No carotid bruit.     Trachea: Trachea normal.  Cardiovascular:     Rate and Rhythm: Normal rate and regular rhythm.     Heart sounds: Normal heart sounds, S1 normal and S2 normal. No murmur heard.    No gallop.  Pulmonary:     Effort: Pulmonary effort is normal. No accessory muscle usage or respiratory distress.     Breath sounds: Normal breath sounds.  Abdominal:     General: Bowel sounds are normal.     Palpations: Abdomen is soft.  Musculoskeletal:        General: Normal range of motion.     Cervical back: Normal range of motion and neck supple.     Right lower leg: No edema.     Left lower leg: No edema.  Lymphadenopathy:     Cervical: No cervical adenopathy.  Skin:  General: Skin is warm and dry.     Capillary Refill: Capillary refill takes less than 2 seconds.       Neurological:     Mental Status: He is alert and oriented to person, place, and time.     Deep Tendon Reflexes:     Reflex Scores:      Patellar reflexes are 1+ on the right side and 1+ on the left side.      Achilles reflexes are 1+ on the right side and 1+ on the left side.    Comments: Antalgic gait, using cane.  Psychiatric:        Attention and Perception: Attention normal.        Mood and Affect: Mood normal.        Speech: Speech normal.        Behavior: Behavior normal. Behavior is cooperative.        Thought Content: Thought content normal.    Results for orders placed or performed in visit on 10/03/21  Bayer DCA Hb A1c Waived  Result Value Ref Range   HB A1C (BAYER DCA - WAIVED) 6.4 (H) 4.8 - 5.6 %      Assessment & Plan:   Problem List Items Addressed This Visit       Cardiovascular and Mediastinum   AF (paroxysmal atrial fibrillation) (HCC)    Chronic, ongoing.  Followed by cardiology at this time.  Continue current medication regimen and collaboration, appreciate their input.  HR regular today with stable rate on exam.      Relevant Orders    T4, free   TSH   Coronary artery disease    Ongoing, continue collaboration with cardiology and current medication regimen.  Recent notes reviewed.      Hypertension associated with diabetes ([place])    Chronic, stable with BP on lower side today.  A1c today 6.4%.  Continue Metformin and current cardiac medications.  Will continue collaboration with cardiology, appreciate their input.  Recommend he continue to check BP at home at least 3 mornings a week and document.  DASH diet.        Relevant Orders   Bayer DCA Hb A1c Waived (Completed)     Endocrine   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HCC)    Chronic, ongoing with A1c 6.4% today and urine ALB 80 (February 2023).  Significant decreased sensation bilateral feet, monitor closely for wounds and falls.  Continue current diabetes medication regimen and adjust as needed. He did not get benefit from Lyrica or Gabapentin, will continue collaboration with neurology and pain clinic.  Continue to monitor BS daily and document for visits.      Relevant Orders   Bayer DCA Hb A1c Waived (Completed)   T4, free   TSH   Diabetes mellitus with proteinuria (HCC) - Primary    Chronic, ongoing with A1c 6.4% today and urine ALB 80 (February 2023).  Decreased sensation bilateral feet, monitor closely for wounds and falls.  Continue current medication regimen and adjust as needed.  Would benefit from return to Benazepril in future or alternate ACE or ARB for kidney protection with proteinuria, however BP on lower side at this time and will defer addition of this.  Continue to monitor BS daily and document for visits.      Relevant Orders   Bayer DCA Hb A1c Waived (Completed)   Hyperlipidemia associated with type 2 diabetes mellitus (HCC)    Chronic, ongoing.  Continue current medication regimen, tolerating 5  MG Crestor well without ADR, and adjust as needed.  Lipid panel up to date.      Relevant Orders   Bayer DCA Hb A1c Waived (Completed)      Genitourinary   CKD (chronic kidney disease) stage 3, GFR 30-59 ml/min (HCC)    Chronic, ongoing.  Continue to monitor closely and refer to nephrology if decline.  Consider restart of Benazepril for kidney protection in future if BP allows due to proteinuria.  Renal dose medications as needed based on labs.          Hematopoietic and Hemostatic   Acquired thrombophilia ([place])    Patient on Eliquis with A-fib, monitor CBC regularly.        Other   Chronic pain syndrome    Chronic, ongoing.  Followed by neurosurgery and pain management as needed.  Continue this collaboration and current regimen.      Elevated TSH    Waxes and wanes -- recheck today and initiate medication as needed.      Fatigue    Ongoing issue with worsening post NSTEMI.  Suspect multifactorial with main issue being need for new CPAP and decompensation. Discussed at length with him today. Continue to collaborate with cardiology and pulmonary on this more complex case.      Relevant Orders   T4, free   TSH   Shortness of breath on exertion    Ongoing issue with worsening post NSTEMI.  Suspect multifactorial with main issue being need for new CPAP and decompensation. Discussed at length with him today. Continue to collaborate with cardiology and pulmonary on this more complex case.      Other Visit Diagnoses     Discoloration of nail       Referral to dermatology.   Relevant Orders   Ambulatory referral to Dermatology        Follow up plan: Return in about 4 weeks (around 10/31/2021) for Fatigue.

## 2021-10-03 NOTE — Assessment & Plan Note (Signed)
Chronic, ongoing.  Continue to monitor closely and refer to nephrology if decline.  Consider restart of Benazepril for kidney protection in future if BP allows due to proteinuria.  Renal dose medications as needed based on labs.

## 2021-10-03 NOTE — Assessment & Plan Note (Signed)
Patient on Eliquis with A-fib, monitor CBC regularly. 

## 2021-10-03 NOTE — Assessment & Plan Note (Addendum)
Ongoing, continue collaboration with cardiology and current medication regimen.  Recent notes reviewed.   

## 2021-10-03 NOTE — Assessment & Plan Note (Signed)
Chronic, ongoing.  Followed by cardiology at this time.  Continue current medication regimen and collaboration, appreciate their input.  HR regular today with stable rate on exam. 

## 2021-10-04 ENCOUNTER — Encounter: Payer: Self-pay | Admitting: Nurse Practitioner

## 2021-10-04 LAB — TSH: TSH: 3.71 u[IU]/mL (ref 0.450–4.500)

## 2021-10-04 LAB — T4, FREE: Free T4: 1.3 ng/dL (ref 0.82–1.77)

## 2021-10-04 NOTE — Progress Notes (Signed)
Contacted via [place] morning [place], your labs have returned: - Thyroid labs have overall improved, no medications needed at this time. - A1c remains stable at 6.4% -- great job!! Keep being amazing!!  Thank you for allowing me to participate in your care.  I appreciate you. Kindest regards, Diyan Dave

## 2021-10-05 ENCOUNTER — Ambulatory Visit
Admission: RE | Admit: 2021-10-05 | Discharge: 2021-10-05 | Disposition: A | Discharge status: Home / Self Care | Payer: Medicare Other | Source: Ambulatory Visit | Attending: Student in an Organized Health Care Education/Training Program | Admitting: Student in an Organized Health Care Education/Training Program

## 2021-10-05 DIAGNOSIS — R600 Localized edema: Secondary | ICD-10-CM | POA: Diagnosis not present

## 2021-10-06 ENCOUNTER — Ambulatory Visit (INDEPENDENT_AMBULATORY_CARE_PROVIDER_SITE_OTHER): Payer: Medicare Other | Admitting: Cardiology

## 2021-10-06 ENCOUNTER — Other Ambulatory Visit: Payer: Self-pay

## 2021-10-06 ENCOUNTER — Encounter: Payer: Self-pay | Admitting: Cardiology

## 2021-10-06 VITALS — BP 102/54 | HR 80 | Ht 73.0 in | Wt 183.2 lb

## 2021-10-06 DIAGNOSIS — I10 Essential (primary) hypertension: Secondary | ICD-10-CM | POA: Diagnosis not present

## 2021-10-06 DIAGNOSIS — I480 Paroxysmal atrial fibrillation: Secondary | ICD-10-CM | POA: Diagnosis not present

## 2021-10-06 DIAGNOSIS — I2510 Atherosclerotic heart disease of native coronary artery without angina pectoris: Secondary | ICD-10-CM

## 2021-10-06 DIAGNOSIS — R0602 Shortness of breath: Secondary | ICD-10-CM

## 2021-10-06 DIAGNOSIS — R0609 Other forms of dyspnea: Secondary | ICD-10-CM

## 2021-10-06 DIAGNOSIS — D649 Anemia, unspecified: Secondary | ICD-10-CM

## 2021-10-06 DIAGNOSIS — E785 Hyperlipidemia, unspecified: Secondary | ICD-10-CM

## 2021-10-06 MED FILL — Iron Sucrose Inj 20 MG/ML (Fe Equiv): INTRAVENOUS | Qty: 10 | Status: AC

## 2021-10-06 NOTE — Patient Instructions (Signed)
Medication Instructions:  Your physician recommends that you continue on your current medications as directed. Please refer to the Current Medication list given to you today.   *If you need a refill on your cardiac medications before your next appointment, please call your pharmacy*   Lab Work: BNP to be done tomorrow with your other labs over at the [facility]   If you have labs (blood work) drawn today and your tests are completely normal, you will receive your results only by: Sean Reyes (if you have MyChart) OR A paper copy in the mail If you have any lab test that is abnormal or we need to change your treatment, we will call you to review the results.   Testing/Procedures: None   Follow-Up: At [facility], you and your health needs are our priority.  As part of our continuing mission to provide you with exceptional heart care, we have created designated Provider Care Teams.  These Care Teams include your primary Cardiologist (physician) and Advanced Practice Providers (APPs -  Physician Assistants and Nurse Practitioners) who all work together to provide you with the care you need, when you need it.  Your next appointment:   2 month(s)  The format for your next appointment:   In Person  Provider:   Kathlyn [place], MD or Gerrie Nordmann, NP {     Important Information About Sugar

## 2021-10-06 NOTE — Progress Notes (Signed)
Cardiology Clinic Note   Patient Name: Sean Reyes. Date of Encounter: 10/06/2021  Primary Care Provider:  [name], NP Primary Cardiologist:  Kathlyn Sacramento, MD  Patient Profile    81 year old male with a past medical history of paroxysmal atrial fibrillation, coronary artery disease, abnormal EKG with PVCs, essential hypertension, hyperlipidemia, type 2 diabetes, and chronic kidney disease who is here today to follow-up on his paroxysmal atrial fibrillation.  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   Hyperlipidemia    Hypertension    Myocardial infarction ([place]) 12/2020   Nocturia    S/P ablation of atrial fibrillation    Ablative therapy   Sleep apnea    CPAP    Spinal stenosis    Tachycardia, unspecified    Past Surgical History:  Procedure Laterality Date   ABLATION     ANTERIOR LAT LUMBAR FUSION N/A 06/27/2017   Procedure: Anterior Lateral Lumbar Interbody  Fusion - Lumbar Two-Lumbar Three - Lumbar Three-Lumbar Four, Posterior Lumbar Interbody Fusion Lumbar Four- Five;  Surgeon: Kary Kos, MD;  Location: [place];  Service: Neurosurgery;  Laterality: N/A;  Anterior Lateral Lumbar Interbody  Fusion - Lumbar Two-Lumbar Three - Lumbar Three-Lumbar Four, Posterior Lumbar Interbody Fusion Lumbar Four- Five   BACK SURGERY     CARDIAC CATHETERIZATION     CARDIOVERSION N/A 08/29/2018   Procedure: CARDIOVERSION;  Surgeon: Corey Skains, MD;  Location: [facility];  Service:  Cardiovascular;  Laterality: N/A;   CARDIOVERSION N/A 09/24/2018   Procedure: CARDIOVERSION;  Surgeon: Corey Skains, MD;  Location: [facility];  Service: Cardiovascular;  Laterality: N/A;   COLONOSCOPY WITH PROPOFOL N/A 10/05/2015   Procedure: COLONOSCOPY WITH PROPOFOL;  Surgeon: Lollie Sails, MD;  Location: [facility] ENDOSCOPY;  Service: Endoscopy;  Laterality: N/A;   COLONOSCOPY WITH PROPOFOL N/A 11/01/2020   Procedure: COLONOSCOPY WITH PROPOFOL;  Surgeon: Jonathon Bellows, MD;  Location[facility] END[facility]COPY;  Service: Gastroenterology;  Laterality: N/A;   CORONARY STENT INTERVENTION N/A 12/22/2020   Procedure: CORONARY STENT INTERVENTION;  Surgeon: [name], MD;  Location: [place] [facility];  Service: Cardiovascular;  Laterality: N/A;   ESOPHAGOGASTRODUODENOSCOPY N/A 11/01/2020   Procedure: ESOPHAGOGASTRODUODENOSCOPY (EGD);  Surgeon: Jonathon Bellows, MD;  Location: [facility] ENDOSCOPY;  Service: Gastroenterology;  Laterality: N/A;   ESOPHAGOGASTRODUODENOSCOPY (EGD) WITH PROPOFOL N/A 04/01/2018   Procedure: ESOPHAGOGASTRODUODENOSCOPY (EGD) WITH PROPOFOL;  Surgeon: Lollie Sails, MD;  Location: [facility] ENDOSCOPY;  Service: Endoscopy;  Laterality: N/A;   HERNIA REPAIR     JOINT REPLACEMENT Bilateral    hips  RT+  LEFT X2    LEFT HEART CATH AND CORONARY ANGIOGRAPHY N/A 12/22/2020   Procedure: LEFT HEART CATH AND CORONARY ANGIOGRAPHY;  Surgeon: [name], MD;  Location: [place] [facility];  Service: Cardiovascular;  Laterality: N/A;   LEFT HEART CATH AND CORONARY  ANGIOGRAPHY Left 08/23/2021   Procedure: LEFT HEART CATH AND CORONARY ANGIOGRAPHY;  Surgeon: Corey Skains, MD;  Location: [place] [facility];  Service: Cardiovascular;  Laterality: Left;   LUMBAR LAMINECTOMY/DECOMPRESSION MICRODISCECTOMY Left 09/13/2016   Procedure: Microdiscectomy - Lumbar two-three,  Lumbar three- - left;  Surgeon: Kary Kos, MD;  Location: [place];  Service: Neurosurgery;  Laterality: Left;    SPINAL CORD STIMULATOR INSERTION  07/08/2019   TONSILLECTOMY      Allergies  Allergies  Allergen Reactions   Levaquin [Levofloxacin In D5w] Anaphylaxis and Shortness Of Breath   Shellfish Allergy Anaphylaxis    Has used duraprep, betadine and ioban in previous surgeries in 2019 and 2018 without issue   Amiodarone Other (See Comments)    Tremors and thyroid toxicity   Adhesive [Tape] Other (See Comments)    Little red bumps under the dressing.  He questions whether is latex related    History of Present Illness    81 year old male with a previous mentioned past medical history of paroxysmal atrial fibrillation, coronary artery disease with PVCs, essential hypertension, hyperlipidemia, type 2 diabetes, and chronic kidney disease.  He presented to [facility] in November 2022 with a non-ST elevated myocardial infarction.  Cardiac catheterization was done which showed severe proximal RCA stenosis which was treated successfully with PCI and drug-eluting stent placement.  He had repeat cardiac catheterization recently that showed patent RCA stent with no significant restenosis.  There was borderline 60% stenosis in the distal LAD and mid distal left circumflex disease.  He also underwent ablation procedure [facility] many years ago with reports having complication during the procedure that this necessitated aborting the procedure.  He subsequently had ablation at [facility] by Dr. Marcello Moores with improvement.  He was placed on propafenone, which was changed to metoprolol after he had his myocardial infarction and earlier this year due to bradycardia was discontinued due to heart rate in the 40s.  Echocardiogram done 09/07/2021 revealed LVEF of 60 to 65%, no regional wall motion abnormalities, G1 DD, and trivial mitral valve regurgitation.  He also wore a ZIO monitor which revealed a heart rate of 46-1 67 the average heart rate of 64.  Predominant underlying rhythm was sinus rhythm with first-degree AV block.  There was 1 run  of ventricular tachycardia that occurred lasting 7 beats and 96 super ventricular tachycardia runs that occurred with the fastest interval lasting for 5 beats with a maximal rate of 167 the longest lasted 13.7 seconds for an average rate of 127 bpm, ventricular bigeminy was also present.  He returns to clinic today with continued shortness of breath. He has followed up with pulmonary and has PFT scheduled for middle of September. He denies any chest pain, chest pressure, or palpitations. Has blood work tomorrow to determine if he needs additional iron infusions.   Home Medications    Current Outpatient Medications  Medication Sig Dispense Refill   clopidogrel (PLAVIX) 75 MG tablet Take 75 mg by mouth daily.     cyanocobalamin 1000 MCG tablet Take by mouth.     ELIQUIS 5 MG TABS tablet Take 1 tablet (5 mg total) by mouth 2 (two) times daily. 180 tablet 1   ferrous sulfate 325 (65 FE) MG EC tablet Take 325 mg by mouth daily with breakfast.     finasteride (PROSCAR) 5 MG tablet Take 1 tablet (5 mg total) by mouth daily. 90 tablet 2   isosorbide mononitrate (IMDUR) 30 MG 24 hr tablet Take 30 mg by mouth  daily.     metFORMIN (GLUCOPHAGE) 500 MG tablet Take 1 tablet (500 mg total) by mouth 2 (two) times daily with a meal. 180 tablet 4   metoprolol succinate (TOPROL XL) 25 MG 24 hr tablet Take 1 tablet (25 mg total) by mouth daily. 30 tablet 5   omeprazole (PRILOSEC) 40 MG capsule Take 40 mg by mouth daily.     rosuvastatin (CRESTOR) 5 MG tablet Take 1 tablet (5 mg total) by mouth daily. 90 tablet 0   solifenacin (VESICARE) 5 MG tablet Take 5 mg by mouth daily.     tamsulosin (FLOMAX) 0.4 MG CAPS capsule Take 0.8 mg by mouth daily.     No current facility-administered medications for this visit.     Family History    Family History  Problem Relation Age of Onset   Brain cancer Mother    Kidney disease Neg Hx    Prostate cancer Neg Hx    Kidney cancer Neg Hx    Bladder Cancer Neg Hx    He  indicated that his mother is deceased. He indicated that his father is deceased. He indicated that the status of his neg hx is unknown.  Social History    Social History   Socioeconomic History   Marital status: Married    Spouse name: Malachy Mood    Number of children: 2   Years of education: Not on file   Highest education level: High school graduate  Occupational History   Occupation: retired   Tobacco Use   Smoking status: Never   Smokeless tobacco: Never  Vaping Use   Vaping Use: Never used  Substance and Sexual Activity   Alcohol use: No   Drug use: No   Sexual activity: Not on file  Other Topics Concern   Not on file  Social History Narrative   MarriedGets regular exercise.      Lives in [place] with wife/ daughter. Never smoked; no alcohol. 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    Social Determinants of Health   Financial Resource Strain: Low Risk  (09/12/2021)   Overall Financial Resource Strain (CARDIA)    Difficulty of Paying Living Expenses: Not hard at all  Food Insecurity: No Food Insecurity (09/12/2021)   Hunger Vital Sign    Worried About Running Out of Food in the Last Year: Never true    Ran Out of Food in the Last Year: Never true  Transportation Needs: No Transportation Needs (09/12/2021)   PRAPARE - Hydrologist (Medical): No    Lack of Transportation (Non-Medical): No  Physical Activity: Inactive (09/23/2021)   Exercise Vital Sign    Days of Exercise per Week: 0 days    Minutes of Exercise per Session: 0 min  Stress: No Stress Concern Present (09/12/2021)   [place]    Feeling of Stress : Only a little  Social Connections: Moderately Integrated (09/12/2021)   Social Connection and Isolation Panel [NHANES]    Frequency of Communication with Friends and Family: Three times a week    Frequency of Social Gatherings with  Friends and Family: Twice a week    Attends Religious Services: More than 4 times per year    Active Member of [employer] or Organizations: No    Attends Archivist Meetings: Never    Marital Status: Married  Human resources officer Violence: Not At Risk (09/12/2021)   Humiliation, Afraid, Rape, and  Kick questionnaire    Fear of Current or Ex-Partner: No    Emotionally Abused: No    Physically Abused: No    Sexually Abused: No     Review of Systems    General:  No chills, fever, night sweats or weight changes. Endorses fatigue. Cardiovascular:  No chest pain, endorses dyspnea on exertion, but denies edema, orthopnea, palpitations, paroxysmal nocturnal dyspnea. Dermatological: No rash, lesions/masses Respiratory: No cough, endorses dyspnea Urologic: No hematuria, dysuria Abdominal:   No nausea, vomiting, diarrhea, bright red blood per rectum, melena, or hematemesis Neurologic:  No visual changes, wkns, changes in mental status. All other systems reviewed and are otherwise negative except as noted above.   Physical Exam    VS:  BP (!) 102/54 (BP Location: Left Arm, Patient Position: Sitting, Cuff Size: Normal)   Pulse 80   Ht '6\' 1"'$  (1.854 m)   Wt 183 lb 3.2 oz (83.1 kg)   SpO2 98%   BMI 24.17 kg/m  , BMI Body mass index is 24.17 kg/m.     GEN: Well nourished, well developed, in no acute distress. HEENT: normal. Glasses on. Neck: Supple, no JVD, carotid bruits, or masses. Cardiac: RRR, no murmurs, rubs, or gallops. No clubbing, cyanosis, edema.  Radials/DP/PT 2+ and equal bilaterally.  Respiratory:  Respirations regular and unlabored, clear to auscultation bilaterally. GI: Soft, nontender, nondistended, BS + x 4. MS: no deformity or atrophy. Skin: warm and dry, no rash. Neuro:  Strength and sensation are intact. Psych: Normal affect.  Accessory Clinical Findings    ECG personally reviewed by me today-sinus, rate 80 with nonspecific t wave abnormalities- No acute  changes  Lab Results  Component Value Date   WBC 5.5 09/05/2021   HGB 11.6 (L) 09/05/2021   HCT 34.0 (L) 09/05/2021   MCV 90 09/05/2021   PLT 198 09/05/2021   Lab Results  Component Value Date   CREATININE 1.40 (H) 09/05/2021   BUN 19 09/05/2021   NA 136 09/05/2021   K 4.4 09/05/2021   CL 98 09/05/2021   CO2 24 09/05/2021   Lab Results  Component Value Date   ALT 16 09/05/2021   AST 17 09/05/2021   ALKPHOS 72 09/05/2021   BILITOT 0.2 09/05/2021   Lab Results  Component Value Date   CHOL 101 04/04/2021   HDL 40 04/04/2021   LDLCALC 43 04/04/2021   TRIG 96 04/04/2021   CHOLHDL 2.6 12/21/2020    Lab Results  Component Value Date   HGBA1C 6.4 (H) 10/03/2021    Assessment & Plan   1.  Coronary artery disease involving native coronary arteries as he is status post myocardial infarction with stent placement to the right coronary artery in 12/2020.  He continues on clopidogrel without aspirin therapy and he is on Eliquis 5 mg twice daily for his history of atrial fibrillation.  Repeat heart catheterization that revealed a patent stent to the RCA.  He did have borderline disease in the distal LAD but the vessel would be too small for intervention.  Is also been advised to do so recommended that he can stop his clopidogrel in November and continue with his Eliquis therapy to minimize the risk of bleeding.  He denies any chest pain today.  Has no ischemic changes noted on his EKG.  2.  History of paroxysmal atrial fibrillation status post ablation x2.  Remains in sinus rhythm today on EKG with rate of 80.  He has continued on Toprol-XL 25 mg daily.  He has worn a ZIO XT monitor for 2 weeks that revealed no recurrent symptomatic atrial fibrillation.  Also known in the past he did not tolerate amiodarone due to tremors and thyroid toxicity.  3.  Essential hypertension blood pressure today 102/54.  Is improved from his blood pressure from his primary care provider's office readings 90.   He is to continue with his Toprol.  Continue monitoring his blood pressure.  4.  Hyperlipidemia with LDL 43, which is currently at goal, continue Crestor 5 mg daily  5.  Severe exertional dyspnea thought to be a component from physical deconditioning requiring consult to pulmonary.  He is scheduled for PFTs through pulmonary.  He had one evening that he noted swelling to his lower extremities. BNP ordered.   6.  Disposition patient return to clinic to see MD/APP in 2 months or sooner if necessary  Nataly Pacifico, NP 10/06/2021, 1:00 PM

## 2021-10-07 ENCOUNTER — Inpatient Hospital Stay (HOSPITAL_BASED_OUTPATIENT_CLINIC_OR_DEPARTMENT_OTHER): Payer: Medicare Other | Admitting: Internal Medicine

## 2021-10-07 ENCOUNTER — Inpatient Hospital Stay: Payer: Medicare Other

## 2021-10-07 ENCOUNTER — Inpatient Hospital Stay: Payer: Medicare Other | Attending: Internal Medicine

## 2021-10-07 ENCOUNTER — Encounter: Payer: Self-pay | Admitting: Internal Medicine

## 2021-10-07 VITALS — BP 125/76 | HR 66 | Temp 98.5°F | Ht 73.0 in | Wt 183.2 lb

## 2021-10-07 DIAGNOSIS — N183 Chronic kidney disease, stage 3 unspecified: Secondary | ICD-10-CM | POA: Insufficient documentation

## 2021-10-07 DIAGNOSIS — I5030 Unspecified diastolic (congestive) heart failure: Secondary | ICD-10-CM

## 2021-10-07 DIAGNOSIS — I480 Paroxysmal atrial fibrillation: Secondary | ICD-10-CM | POA: Diagnosis not present

## 2021-10-07 DIAGNOSIS — R0602 Shortness of breath: Secondary | ICD-10-CM | POA: Diagnosis not present

## 2021-10-07 DIAGNOSIS — E1159 Type 2 diabetes mellitus with other circulatory complications: Secondary | ICD-10-CM | POA: Diagnosis not present

## 2021-10-07 DIAGNOSIS — D649 Anemia, unspecified: Secondary | ICD-10-CM

## 2021-10-07 DIAGNOSIS — Z79899 Other long term (current) drug therapy: Secondary | ICD-10-CM | POA: Insufficient documentation

## 2021-10-07 DIAGNOSIS — I2510 Atherosclerotic heart disease of native coronary artery without angina pectoris: Secondary | ICD-10-CM | POA: Insufficient documentation

## 2021-10-07 DIAGNOSIS — I152 Hypertension secondary to endocrine disorders: Secondary | ICD-10-CM

## 2021-10-07 DIAGNOSIS — Z7901 Long term (current) use of anticoagulants: Secondary | ICD-10-CM | POA: Insufficient documentation

## 2021-10-07 LAB — CBC WITH DIFFERENTIAL/PLATELET
Abs Immature Granulocytes: 0.02 10*3/uL (ref 0.00–0.07)
Basophils Absolute: 0 10*3/uL (ref 0.0–0.1)
Basophils Relative: 0 %
Eosinophils Absolute: 0 10*3/uL (ref 0.0–0.5)
Eosinophils Relative: 1 %
HCT: 33.9 % — ABNORMAL LOW (ref 39.0–52.0)
Hemoglobin: 11.5 g/dL — ABNORMAL LOW (ref 13.0–17.0)
Immature Granulocytes: 0 %
Lymphocytes Relative: 31 %
Lymphs Abs: 1.7 10*3/uL (ref 0.7–4.0)
MCH: 30.9 pg (ref 26.0–34.0)
MCHC: 33.9 g/dL (ref 30.0–36.0)
MCV: 91.1 fL (ref 80.0–100.0)
Monocytes Absolute: 0.6 10*3/uL (ref 0.1–1.0)
Monocytes Relative: 11 %
Neutro Abs: 3.2 10*3/uL (ref 1.7–7.7)
Neutrophils Relative %: 57 %
Platelets: 175 10*3/uL (ref 150–400)
RBC: 3.72 MIL/uL — ABNORMAL LOW (ref 4.22–5.81)
RDW: 13.1 % (ref 11.5–15.5)
WBC: 5.5 10*3/uL (ref 4.0–10.5)
nRBC: 0 % (ref 0.0–0.2)

## 2021-10-07 LAB — COMPREHENSIVE METABOLIC PANEL
ALT: 16 U/L (ref 0–44)
AST: 19 U/L (ref 15–41)
Albumin: 3.6 g/dL (ref 3.5–5.0)
Alkaline Phosphatase: 53 U/L (ref 38–126)
Anion gap: 9 (ref 5–15)
BUN: 23 mg/dL (ref 8–23)
CO2: 26 mmol/L (ref 22–32)
Calcium: 9.2 mg/dL (ref 8.9–10.3)
Chloride: 98 mmol/L (ref 98–111)
Creatinine, Ser: 1.28 mg/dL — ABNORMAL HIGH (ref 0.61–1.24)
GFR, Estimated: 57 mL/min — ABNORMAL LOW (ref >60)
Glucose, Bld: 171 mg/dL — ABNORMAL HIGH (ref 70–99)
Potassium: 4.2 mmol/L (ref 3.5–5.1)
Sodium: 133 mmol/L — ABNORMAL LOW (ref 135–145)
Total Bilirubin: 0.6 mg/dL (ref 0.3–1.2)
Total Protein: 7.3 g/dL (ref 6.5–8.1)

## 2021-10-07 LAB — IRON AND TIBC
Iron: 68 ug/dL (ref 45–182)
Saturation Ratios: 21 % (ref 17.9–39.5)
TIBC: 326 ug/dL (ref 250–450)
UIBC: 258 ug/dL

## 2021-10-07 LAB — BRAIN NATRIURETIC PEPTIDE: B Natriuretic Peptide: 74.1 pg/mL (ref 0.0–100.0)

## 2021-10-07 LAB — FERRITIN: Ferritin: 106 ng/mL (ref 24–336)

## 2021-10-07 NOTE — Assessment & Plan Note (Addendum)
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 S/p IV venofer weekly x4.; on slow release PO iron.   # JUNE 2023- Iron sat- 15%. HOLD IV venofer. 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   # CAD [sp stenting OCT 2022; Dr.Arida]; on Eliquis/antiplatelet therapy.STABLE.   *BNP-dr.Arida # DISPOSITION:  # HOLD venofer- # in 6 months- MD; labs- cbc/cmp; iron studies/feritin-;possible venofer- Dr.B

## 2021-10-07 NOTE — Progress Notes (Unsigned)
[place] NOTE  Patient Care Team: [name], NP as PCP - General (Nurse Practitioner) [name], MD as PCP - Family Medicine (Family Medicine) Wellington Hampshire, MD as PCP - Cardiology (Cardiology) Ralene Bathe, MD (Dermatology) Kary Kos, MD as Consulting Physician (Neurosurgery) Hooten, Laurice Record, MD (Orthopedic Surgery) Marlaine Hind, MD as Consulting Physician (Physical Medicine and Rehabilitation) Christy Sartorius, MD as Referring Physician (Urology) Vanita Ingles, RN as Case Manager ([place]) Cammie Sickle, MD as Consulting Physician (Oncology)  CHIEF COMPLAINTS/PURPOSE OF CONSULTATION: ANEMIA   HEMATOLOGY HISTORY: ;  #Chronic anemia hemoglobin 10-11; October 22 -iron saturation 17% ferritin 90s./? CKD-S/p Venofer x4; Jan 2023- Hb 12.3;  EGD/Colonoscopy: Sep, 2022- Dr.Anna  # OCT 2022-non-STEMI- [s/p stenting]aspirin Brilinta; paroxysmal A. fib on Eliquis [Dr. Paraschoes/Dr.Kowalski--> Dr. Arida];CKD stage III; chronic back pain-s/p back surgeries s/p spinal cord-pain stimulator; diabetes on oral medication; 2022-C. difficile colitis-s/p oral vancomycin.   HISTORY OF PRESENTING ILLNESS: Ambulating cane/crutches; alone.  Sean Reyes. 81 y.o.  male iron deficient anemia/?  CKD stage III is here for follow-up.   Had tough time in last few months with difficulty with reathing..   Patient currently on oral iron/slow release.  Tolerating well.  No constipation.  Denies any blood in stools or black-colored stools.   Review of Systems  Constitutional:  Positive for malaise/fatigue. Negative for chills, diaphoresis, fever and weight loss.  HENT:  Negative for nosebleeds and sore throat.   Eyes:  Negative for double vision.  Respiratory:  Positive for shortness of breath. Negative for cough, hemoptysis, sputum production and wheezing.   Cardiovascular:  Negative for chest pain, palpitations, orthopnea and leg  swelling.  Gastrointestinal:  Negative for abdominal pain, blood in stool, constipation, diarrhea, heartburn, melena, nausea and vomiting.  Genitourinary:  Negative for dysuria, frequency and urgency.  Musculoskeletal:  Positive for back pain and joint pain.  Skin: Negative.  Negative for itching and rash.  Neurological:  Negative for dizziness, tingling, focal weakness, weakness and headaches.  Endo/Heme/Allergies:  Does not bruise/bleed easily.  Psychiatric/Behavioral:  Negative for depression. The patient is not nervous/anxious and does not have insomnia.     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   Hyperlipidemia    Hypertension    Myocardial infarction ([place]) 12/2020   Nocturia    S/P ablation of atrial fibrillation    Ablative therapy   Sleep apnea    CPAP    Spinal stenosis    Tachycardia, unspecified     SURGICAL HISTORY: Past Surgical History:  Procedure Laterality Date   ABLATION     ANTERIOR LAT LUMBAR FUSION N/A 06/27/2017   Procedure: Anterior Lateral Lumbar Interbody  Fusion - Lumbar Two-Lumbar Three - Lumbar Three-Lumbar Four, Posterior Lumbar Interbody Fusion Lumbar Four- Five;  Surgeon: Kary Kos, MD;  Location: [facility];  Service: Neurosurgery;  Laterality: N/A;  Anterior Lateral Lumbar Interbody  Fusion - Lumbar Two-Lumbar Three - Lumbar Three-Lumbar Four, Posterior Lumbar Interbody Fusion Lumbar Four- Five   BACK SURGERY     CARDIAC CATHETERIZATION     CARDIOVERSION N/A 08/29/2018    Procedure: CARDIOVERSION;  Surgeon: Corey Skains, MD;  Location: [facility];  Service: Cardiovascular;  Laterality: N/A;   CARDIOVERSION N/A 09/24/2018   Procedure: CARDIOVERSION;  Surgeon: Corey Skains, MD;  Location: [facility];  Service: Cardiovascular;  Laterality: N/A;   COLONOSCOPY WITH PROPOFOL N/A 10/05/2015   Procedure: COLONOSCOPY WITH PROPOFOL;  Surgeon: Lollie Sails, MD;  Location: [facility] ENDOSCOPY;  Service: Endoscopy;  Laterality: N/A;   COLONOSCOPY WITH PROPOFOL N/A 11/01/2020   Procedure: COLONOSCOPY WITH PROPOFOL;  Surgeon: Jonathon Bellows, MD;  Location: [facility] ENDOSCOPY;  Service: Gastroenterology;  Laterality: N/A;   CORONARY STENT INTERVENTION N/A 12/22/2020   Procedure: CORONARY STENT INTERVENTION;  Surgeon: [name], MD;  Location: [place] [facility];  Service: Cardiovascular;  Laterality: N/A;   ESOPHAGOGASTRODUODENOSCOPY N/A 11/01/2020   Procedure: ESOPHAGOGASTRODUODENOSCOPY (EGD);  Surgeon: Jonathon Bellows, MD;  Location: [facility] ENDOSCOPY;  Service: Gastroenterology;  Laterality: N/A;   ESOPHAGOGASTRODUODENOSCOPY (EGD) WITH PROPOFOL N/A 04/01/2018   Procedure: ESOPHAGOGASTRODUODENOSCOPY (EGD) WITH PROPOFOL;  Surgeon: Lollie Sails, MD;  Location[facility];  Service: Endoscopy;  Laterality: N/A;   HERNIA REPAIR     JOINT REPLACEMENT Bilateral    hips  RT+  LEFT X2    LEFT HEART CATH AND CORONARY ANGIOGRAPHY N/A 12/22/2020   Procedure: LEFT HEART CATH AND CORONARY ANGIOGRAPHY;  Surgeon: [name], MD;  Location: [place] [facility];  Service: Cardiovascular;  Laterality: N/A;   LEFT HEART CATH AND CORONARY ANGIOGRAPHY Left 08/23/2021   Procedure: LEFT HEART CATH AND CORONARY ANGIOGRAPHY;  Surgeon: Corey Skains, MD;  Location: [place] [facility];  Service: Cardiovascular;  Laterality: Left;   LUMBAR LAMINECTOMY/DECOMPRESSION MICRODISCECTOMY Left 09/13/2016   Procedure: Microdiscectomy - Lumbar two-three,  Lumbar three- - left;   Surgeon: Kary Kos, MD;  Location: [facility];  Service: Neurosurgery;  Laterality: Left;   SPINAL CORD STIMULATOR INSERTION  07/08/2019   TONSILLECTOMY      SOCIAL HISTORY: Social History   Socioeconomic History   Marital status: Married    Spouse name: Malachy Mood    Number of children: 2   Years of education: Not on file   Highest education level: High school graduate  Occupational History   Occupation: retired   Tobacco Use   Smoking status: Never   Smokeless tobacco: Never  Vaping Use   Vaping Use: Never used  Substance and Sexual Activity   Alcohol use: No   Drug use: No   Sexual activity: Not on file  Other Topics Concern   Not on file  Social History Narrative   MarriedGets regular exercise.      Lives in [place] with wife/ daughter. Never smoked; no alcohol. 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    Social Determinants of Health   Financial Resource Strain: Low Risk  (09/12/2021)   Overall Financial Resource Strain (CARDIA)    Difficulty of Paying Living Expenses: Not hard at all  Food Insecurity: No Food Insecurity (09/12/2021)   Hunger Vital Sign    Worried About Running Out of Food in the Last Year: Never true    Ran Out of Food in the Last Year: Never true  Transportation Needs: No Transportation Needs (  09/12/2021)   PRAPARE - Hydrologist (Medical): No    Lack of Transportation (Non-Medical): No  Physical Activity: Inactive (09/23/2021)   Exercise Vital Sign    Days of Exercise per Week: 0 days    Minutes of Exercise per Session: 0 min  Stress: No Stress Concern Present (09/12/2021)   [place]    Feeling of Stress : Only a little  Social Connections: Moderately Integrated (09/12/2021)   Social Connection and Isolation Panel [NHANES]    Frequency of Communication with Friends and Family: Three times a week    Frequency of Social  Gatherings with Friends and Family: Twice a week    Attends Religious Services: More than 4 times per year    Active Member of [employer] or Organizations: No    Attends Archivist Meetings: Never    Marital Status: Married  Human resources officer Violence: Not At Risk (09/12/2021)   Humiliation, Afraid, Rape, and Kick questionnaire    Fear of Current or Ex-Partner: No    Emotionally Abused: No    Physically Abused: No    Sexually Abused: No    FAMILY HISTORY: Family History  Problem Relation Age of Onset   Brain cancer Mother    Kidney disease Neg Hx    Prostate cancer Neg Hx    Kidney cancer Neg Hx    Bladder Cancer Neg Hx     ALLERGIES:  is allergic to levaquin [levofloxacin in d5w], shellfish allergy, amiodarone, and adhesive [tape].  MEDICATIONS:  Current Outpatient Medications  Medication Sig Dispense Refill   clopidogrel (PLAVIX) 75 MG tablet Take 75 mg by mouth daily.     cyanocobalamin 1000 MCG tablet Take by mouth.     ELIQUIS 5 MG TABS tablet Take 1 tablet (5 mg total) by mouth 2 (two) times daily. 180 tablet 1   ferrous sulfate 325 (65 FE) MG EC tablet Take 325 mg by mouth daily with breakfast.     finasteride (PROSCAR) 5 MG tablet Take 1 tablet (5 mg total) by mouth daily. 90 tablet 2   isosorbide mononitrate (IMDUR) 30 MG 24 hr tablet Take 30 mg by mouth daily.     metFORMIN (GLUCOPHAGE) 500 MG tablet Take 1 tablet (500 mg total) by mouth 2 (two) times daily with a meal. 180 tablet 4   metoprolol succinate (TOPROL XL) 25 MG 24 hr tablet Take 1 tablet (25 mg total) by mouth daily. 30 tablet 5   omeprazole (PRILOSEC) 40 MG capsule Take 40 mg by mouth daily.     rosuvastatin (CRESTOR) 5 MG tablet Take 1 tablet (5 mg total) by mouth daily. 90 tablet 0   solifenacin (VESICARE) 5 MG tablet Take 5 mg by mouth daily.     tamsulosin (FLOMAX) 0.4 MG CAPS capsule Take 0.8 mg by mouth daily.     No current facility-administered medications for this visit.      PHYSICAL  EXAMINATION:   Vitals:   10/07/21 1425  BP: 125/76  Pulse: 66  Temp: 98.5 F (36.9 C)  SpO2: 100%   Filed Weights   10/07/21 1425  Weight: 183 lb 3.2 oz (83.1 kg)    Physical Exam Vitals and nursing note reviewed.  HENT:     Head: Normocephalic and atraumatic.     Mouth/Throat:     Pharynx: Oropharynx is clear.  Eyes:     Extraocular Movements: Extraocular movements intact.  Pupils: Pupils are equal, round, and reactive to light.  Cardiovascular:     Rate and Rhythm: Normal rate and regular rhythm.  Pulmonary:     Comments: Decreased breath sounds bilaterally.  Abdominal:     Palpations: Abdomen is soft.  Musculoskeletal:        General: Normal range of motion.     Cervical back: Normal range of motion.  Skin:    General: Skin is warm.  Neurological:     General: No focal deficit present.     Mental Status: He is alert and oriented to person, place, and time.  Psychiatric:        Behavior: Behavior normal.        Judgment: Judgment normal.     LABORATORY DATA:  I have reviewed the data as listed Lab Results  Component Value Date   WBC 5.5 10/07/2021   HGB 11.5 (L) 10/07/2021   HCT 33.9 (L) 10/07/2021   MCV 91.1 10/07/2021   PLT 175 10/07/2021   Recent Labs    06/20/21 1132 08/04/21 1021 08/05/21 0732 09/05/21 1529 10/07/21 1408  NA 135 134 138 136 133*  K 4.4 4.4 4.3 4.4 4.2  CL 103 97 103 98 98  CO2 '26 23 28 24 26  '$ GLUCOSE 245* 170* 138* 152* 171*  BUN '21 22 21 19 23  '$ CREATININE 1.26* 1.28* 1.30* 1.40* 1.28*  CALCIUM 9.7 9.5 9.1 9.6 9.2  GFRNONAA 58*  --  56*  --  57*  PROT  --  6.7  --  6.7 7.3  ALBUMIN  --  4.0  --  4.0 3.6  AST  --  21  --  17 19  ALT  --  21  --  16 16  ALKPHOS  --  61  --  72 53  BILITOT  --  0.3  --  0.2 0.6     US Venous Img Lower Bilateral (DVT)  Result Date: 10/05/2021 CLINICAL DATA:  Bilateral lower extremity edema EXAM: BILATERAL LOWER EXTREMITY VENOUS DOPPLER ULTRASOUND TECHNIQUE: Gray-scale sonography  with graded compression, as well as color Doppler and duplex ultrasound were performed to evaluate the lower extremity deep venous systems from the level of the common femoral vein and including the common femoral, femoral, profunda femoral, popliteal and calf veins including the posterior tibial, peroneal and gastrocnemius veins when visible. The superficial great saphenous vein was also interrogated. Spectral Doppler was utilized to evaluate flow at rest and with distal augmentation maneuvers in the common femoral, femoral and popliteal veins. COMPARISON:  None Available. FINDINGS: RIGHT LOWER EXTREMITY Common Femoral Vein: No evidence of thrombus. Normal compressibility, respiratory phasicity and response to augmentation. Saphenofemoral Junction: No evidence of thrombus. Normal compressibility and flow on color Doppler imaging. Profunda Femoral Vein: No evidence of thrombus. Normal compressibility and flow on color Doppler imaging. Femoral Vein: No evidence of thrombus. Normal compressibility, respiratory phasicity and response to augmentation. Popliteal Vein: No evidence of thrombus. Normal compressibility, respiratory phasicity and response to augmentation. Calf Veins: No evidence of thrombus. Normal compressibility and flow on color Doppler imaging. Superficial Great Saphenous Vein: No evidence of thrombus. Normal compressibility. Venous Reflux:  None. Other Findings:  None. LEFT LOWER EXTREMITY Common Femoral Vein: No evidence of thrombus. Normal compressibility, respiratory phasicity and response to augmentation. Saphenofemoral Junction: No evidence of thrombus. Normal compressibility and flow on color Doppler imaging. Profunda Femoral Vein: No evidence of thrombus. Normal compressibility and flow on color Doppler imaging. Femoral Vein: No evidence of  thrombus. Normal compressibility, respiratory phasicity and response to augmentation. Popliteal Vein: No evidence of thrombus. Normal compressibility,  respiratory phasicity and response to augmentation. Calf Veins: No evidence of thrombus. Normal compressibility and flow on color Doppler imaging. Superficial Great Saphenous Vein: No evidence of thrombus. Normal compressibility. Venous Reflux:  None. Other Findings:  None. IMPRESSION: No evidence of deep venous thrombosis in either lower extremity. Electronically Signed   By: Jacqulynn Cadet M.D.   On: 10/05/2021 16:44    Symptomatic anemia Mild to moderate symptomatic anemia Hb 11-12;/Likely CKD-III. S/p IV venofer weekly x4.; on slow release PO iron.   # JUNE 2023- Iron sat- 15%. HOLD IV venofer. 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   # CAD [sp stenting OCT 2022; Dr.Arida]; on Eliquis/antiplatelet therapy.STABLE.   *BNP-dr.Arida # DISPOSITION:  # HOLD venofer- # in 6 months- MD; labs- cbc/cmp; iron studies/feritin-;possible venofer- Dr.B   All questions were answered. The patient knows to call the clinic with any problems, questions or concerns.    Cammie Sickle, MD 10/07/2021 3:44 PM

## 2021-10-07 NOTE — Progress Notes (Unsigned)
No concerns. 

## 2021-10-11 ENCOUNTER — Encounter: Payer: Self-pay | Admitting: Internal Medicine

## 2021-10-14 NOTE — Telephone Encounter (Signed)
If he would like to stop this medication that is ok, but if he develops any chest discomfort then we will have to look at restarting it later. Thanks.

## 2021-10-19 DIAGNOSIS — L608 Other nail disorders: Secondary | ICD-10-CM | POA: Diagnosis not present

## 2021-10-25 ENCOUNTER — Ambulatory Visit: Payer: Medicare Other | Admitting: Student in an Organized Health Care Education/Training Program

## 2021-10-26 ENCOUNTER — Ambulatory Visit: Payer: Medicare Other | Attending: Student in an Organized Health Care Education/Training Program

## 2021-10-26 DIAGNOSIS — R0602 Shortness of breath: Secondary | ICD-10-CM | POA: Diagnosis not present

## 2021-10-26 LAB — PULMONARY FUNCTION TEST ARMC ONLY
DL/VA % pred: 80 %
DL/VA: 3.09 ml/min/mmHg/L
DLCO unc % pred: 73 %
DLCO unc: 19.67 ml/min/mmHg
FEF 25-75 Post: 2.06 L/sec
FEF 25-75 Pre: 1.12 L/sec
FEF2575-%Change-Post: 83 %
FEF2575-%Pred-Post: 89 %
FEF2575-%Pred-Pre: 48 %
FEV1-%Change-Post: 8 %
FEV1-%Pred-Post: 77 %
FEV1-%Pred-Pre: 70 %
FEV1-Post: 2.54 L
FEV1-Pre: 2.34 L
FEV1FVC-%Change-Post: 8 %
FEV1FVC-%Pred-Pre: 80 %
FEV6-%Change-Post: -6 %
FEV6-%Pred-Post: 87 %
FEV6-%Pred-Pre: 93 %
FEV6-Post: 3.78 L
FEV6-Pre: 4.02 L
FEV6FVC-%Change-Post: 0 %
FEV6FVC-%Pred-Post: 106 %
FEV6FVC-%Pred-Pre: 106 %
FVC-%Change-Post: 0 %
FVC-%Pred-Post: 87 %
FVC-%Pred-Pre: 87 %
FVC-Post: 4.03 L
FVC-Pre: 4.03 L
Post FEV1/FVC ratio: 63 %
Post FEV6/FVC ratio: 100 %
Pre FEV1/FVC ratio: 58 %
Pre FEV6/FVC Ratio: 100 %
RV % pred: 75 %
RV: 2.11 L
TLC % pred: 79 %
TLC: 6.11 L

## 2021-10-26 MED ORDER — ALBUTEROL SULFATE (2.5 MG/3ML) 0.083% IN NEBU
2.5000 mg | INHALATION_SOLUTION | Freq: Once | RESPIRATORY_TRACT | Status: AC
  Administered 2021-10-26: 2.5 mg via RESPIRATORY_TRACT

## 2021-10-27 ENCOUNTER — Encounter: Payer: Self-pay | Admitting: Student in an Organized Health Care Education/Training Program

## 2021-10-27 ENCOUNTER — Ambulatory Visit (INDEPENDENT_AMBULATORY_CARE_PROVIDER_SITE_OTHER): Payer: Medicare Other | Admitting: Student in an Organized Health Care Education/Training Program

## 2021-10-27 VITALS — BP 134/80 | HR 67 | Temp 98.0°F | Ht 73.0 in | Wt 185.2 lb

## 2021-10-27 DIAGNOSIS — R0602 Shortness of breath: Secondary | ICD-10-CM

## 2021-10-27 DIAGNOSIS — J449 Chronic obstructive pulmonary disease, unspecified: Secondary | ICD-10-CM

## 2021-10-27 MED ORDER — ANORO ELLIPTA 62.5-25 MCG/ACT IN AEPB: 1.0000 | INHALATION_SPRAY | Freq: Every day | RESPIRATORY_TRACT | 11 refills | Status: DC

## 2021-10-27 MED ORDER — ALBUTEROL SULFATE HFA 108 (90 BASE) MCG/ACT IN AERS: 2.0000 | INHALATION_SPRAY | Freq: Four times a day (QID) | RESPIRATORY_TRACT | 2 refills | Status: DC | PRN

## 2021-10-27 NOTE — Progress Notes (Signed)
Synopsis: follow up for shortness of breath.  Assessment & Plan:   #Shortness of breath on exertion #COPD GOLD 2E  Patient presenting with shortness of breath with PFT's performed yesterday showing obstruction with a reduced FEV1/FVC ratio and mild drop in FEV1. 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 He tells me the bronchodilator during the PFT actually helped with his breathing. His PFT's also show a drop in his TLC as well as DLCO - while the PFT itself did not meet ATS criteria due to poor effort, I am concerned that he might be developing restrictive lung disease in addition to his documented obstruction. 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 The flow volume loops also exhibit flattening in the expiratory loop suggesting possible intrathoracic obstruction. This could be confounded by his poor effort performing the PFT. His lung exam is re-assuring with no rales, rhonchi, stridor, or wheeze.  Given signs of restriction and concern for intrathoracic obstruction, I will obtain a CT scan of his chest without contrast. For management of his obstructive lung disease, I discussed the different inhaler options with him and believe he would benefit from a combination therapy with LAMA/LABA. I will start Anoro Ellipta. I counseled Sean Reyes on the possible side effects from the LAMA therapy that includes urinary retention and worsening BPH. He will stop the inhaler and call our office should he develop urinary symptoms. Other workup I will consider during our follow up is a sniff test to evaluate the function of his diaphragm given a slightly elevated right hemi-diaphragm on CXR. That said, he has not had any surgery and I don't suspect this is contributing to his symptoms.  Unfortunately, given his chronic lower back and lower extremity  pains, he would not be a candidate for a CPET (Cardio Pulmonary Exercise Test) to further evaluate the etiology behind his shortness of breath.   - CT CHEST WO CONTRAST; Future - umeclidinium-vilanterol (ANORO ELLIPTA) 62.5-25 MCG/ACT AEPB; Inhale 1 puff into the lungs daily.  Dispense: 30 each; Refill: 11 - albuterol (VENTOLIN HFA) 108 (90 Base) MCG/ACT inhaler; Inhale 2 puffs into the lungs every 6 (six) hours as needed for wheezing or shortness of breath.  Dispense: 8 g; Refill: 2  Return in about 4 weeks (around 11/24/2021).  I spent 30 minutes caring for this patient today, including preparing to see the patient, obtaining and/or reviewing separately obtained history, performing a medically appropriate examination and/or evaluation, counseling and educating the patient/family/caregiver, ordering medications, tests, or procedures, documenting clinical information in the electronic health record, and independently interpreting results (not separately reported/billed) and communicating results to the patient/family/caregiver  Armando Reichert, MD [facility] 10/27/2021 10:50 AM    End of visit medications:  Meds ordered this encounter  Medications   umeclidinium-vilanterol (ANORO ELLIPTA) 62.5-25 MCG/ACT AEPB    Sig: Inhale 1 puff into the lungs daily.    Dispense:  30 each    Refill:  11   albuterol (VENTOLIN HFA) 108 (90 Base) MCG/ACT inhaler    Sig: Inhale 2 puffs into the lungs every 6 (six) hours as needed for wheezing or shortness of breath.    Dispense:  8 g    Refill:  2     Current Outpatient Medications:    albuterol (VENTOLIN HFA) 108 (90 Base) MCG/ACT inhaler, Inhale 2 puffs into the lungs every  6 (six) hours as needed for wheezing or shortness of breath., Disp: 8 g, Rfl: 2   clopidogrel (PLAVIX) 75 MG tablet, Take 75 mg by mouth daily., Disp: , Rfl:    cyanocobalamin 1000 MCG tablet, Take by mouth., Disp: , Rfl:    ELIQUIS 5 MG TABS tablet, Take 1  tablet (5 mg total) by mouth 2 (two) times daily., Disp: 180 tablet, Rfl: 1   ferrous sulfate 325 (65 FE) MG EC tablet, Take 325 mg by mouth daily with breakfast., Disp: , Rfl:    finasteride (PROSCAR) 5 MG tablet, Take 1 tablet (5 mg total) by mouth daily., Disp: 90 tablet, Rfl: 2   isosorbide mononitrate (IMDUR) 30 MG 24 hr tablet, Take 30 mg by mouth daily., Disp: , Rfl:    metFORMIN (GLUCOPHAGE) 500 MG tablet, Take 1 tablet (500 mg total) by mouth 2 (two) times daily with a meal., Disp: 180 tablet, Rfl: 4   metoprolol succinate (TOPROL XL) 25 MG 24 hr tablet, Take 1 tablet (25 mg total) by mouth daily., Disp: 30 tablet, Rfl: 5   omeprazole (PRILOSEC) 40 MG capsule, Take 40 mg by mouth daily., Disp: , Rfl:    rosuvastatin (CRESTOR) 5 MG tablet, Take 1 tablet (5 mg total) by mouth daily., Disp: 90 tablet, Rfl: 0   solifenacin (VESICARE) 5 MG tablet, Take 5 mg by mouth daily., Disp: , Rfl:    tamsulosin (FLOMAX) 0.4 MG CAPS capsule, Take 0.8 mg by mouth daily., Disp: , Rfl:    umeclidinium-vilanterol (ANORO ELLIPTA) 62.5-25 MCG/ACT AEPB, Inhale 1 puff into the lungs daily., Disp: 30 each, Rfl: 11   Subjective:   PATIENT ID: Sean Reyes. GENDER: male DOB: 1940/03/09, MRN: [id number]  Chief Complaint  Patient presents with   Follow-up    PFT 10/26/21. SOB with activity and dry cough.     HPI  Sean Reyes is a pleasant 81 year old male presenting for follow up on shortness of breath. I last saw him 09/28/2021 for an evaluation.  He reports that his symptoms mostly started after suffering a heart attack. He has been seen by Dr. Fletcher Anon at the [facility] center and referred to pulmonary for further workup of his shortness of breath.   He tells me that his shortness of breath is mostly with exertion, and sometimes at rest. He has an occasional dry cough but no sputum production. He has significant lower back and lower extremity pain, and has had a pain stimulator placed to control his symptoms.  He does not endorse shortness of breath prior to his heart attack. He reports that it is limiting to his activities of daily living and he has not been able to do the things he wants to do. He doesn't have any new symptoms.  In addition to his CAD (RCA stent, 60% distal LAD), he's had an ablation procedure for Afib twice, with improvement. He was maintained on propafenone and metoprolol following the procedure. He will have a cardiac monitor placed and will follow up for that.   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 His father was also a Building control surveyor and he used to help him growing up. Denies any exposure to asbestos.   Ancillary information including prior medications, full medical/surgical/family/social histories, and PFTs (when available) are listed below and have been reviewed.   Review of Systems  Constitutional:  Negative for chills and fever.  Respiratory:  Positive for shortness of breath. Negative for cough, sputum production and wheezing.   Cardiovascular:  Positive for palpitations. Negative for chest pain.     Objective:   Vitals:   10/27/21 1023  BP: 134/80  Pulse: 67  Temp: 98 F (36.7 C)  TempSrc: Temporal  SpO2: 98%  Weight: 185 lb 3.2 oz (84 kg)  Height: '6\' 1"'$  (1.854 m)   98% on RA BMI Readings from Last 3 Encounters:  10/27/21 24.43 kg/m  10/07/21 24.17 kg/m  10/06/21 24.17 kg/m   Wt Readings from Last 3 Encounters:  10/27/21 185 lb 3.2 oz (84 kg)  10/07/21 183 lb 3.2 oz (83.1 kg)  10/06/21 183 lb 3.2 oz (83.1 kg)    Physical Exam Constitutional:      General: He is not in acute distress.    Appearance: Normal appearance. He is not ill-appearing.  HENT:     Mouth/Throat:     Mouth: Mucous membranes are moist.  Eyes:     Pupils: Pupils are equal, round, and reactive to light.  Cardiovascular:     Rate and  Rhythm: Normal rate. Rhythm irregular.     Pulses: Normal pulses.     Heart sounds: Normal heart sounds.  Pulmonary:     Effort: Pulmonary effort is normal.     Breath sounds: Normal breath sounds. No wheezing.  Abdominal:     General: Abdomen is flat.     Palpations: Abdomen is soft.  Musculoskeletal:     Cervical back: Normal range of motion.     Right lower leg: No edema.     Left lower leg: No edema.  Neurological:     General: No focal deficit present.     Mental Status: He is alert and oriented to person, place, and time.       Ancillary Information    Past Medical History:  Diagnosis Date   Anemia    Anxiety    Arthritis    Arthritis of neck    Atrial fibrillation (HCC)    Cataracts, bilateral    Complication of anesthesia    pt reports low BP's after surgery at [facility] and difficulty awakening   Depression    Diabetes ([place])    dx 6-8 yrs ago   Dysrhythmia    a-fib   GERD (gastroesophageal reflux disease)    OCC TAKES ALKA SELTZER   History of kidney stones    10-15 yrs ago   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   Hyperlipidemia    Hypertension    Myocardial infarction ([place]) 12/2020   Nocturia    S/P ablation of atrial fibrillation    Ablative therapy   Sleep apnea    CPAP    Spinal stenosis    Tachycardia, unspecified      Family History  Problem Relation Age of Onset   Brain cancer Mother    Kidney disease Neg Hx    Prostate cancer Neg Hx    Kidney cancer Neg Hx    Bladder Cancer Neg Hx      Past Surgical History:  Procedure Laterality Date   ABLATION     ANTERIOR LAT LUMBAR FUSION N/A 06/27/2017   Procedure: Anterior Lateral Lumbar Interbody  Fusion - Lumbar Two-Lumbar Three - Lumbar Three-Lumbar Four, Posterior Lumbar Interbody Fusion Lumbar Four- Five;  Surgeon: Kary Kos, MD;  Location: [place];  Service: Neurosurgery;  Laterality:  N/A;  Anterior Lateral Lumbar Interbody  Fusion - Lumbar Two-Lumbar Three - Lumbar  Three-Lumbar Four, Posterior Lumbar Interbody Fusion Lumbar Four- Five   BACK SURGERY     CARDIAC CATHETERIZATION     CARDIOVERSION N/A 08/29/2018   Procedure: CARDIOVERSION;  Surgeon: Corey Skains, MD;  Location: [facility];  Service: Cardiovascular;  Laterality: N/A;   CARDIOVERSION N/A 09/24/2018   Procedure: CARDIOVERSION;  Surgeon: Corey Skains, MD;  Location: [facility];  Service: Cardiovascular;  Laterality: N/A;   COLONOSCOPY WITH PROPOFOL N/A 10/05/2015   Procedure: COLONOSCOPY WITH PROPOFOL;  Surgeon: Lollie Sails, MD;  Location: [facility] ENDOSCOPY;  Service: Endoscopy;  Laterality: N/A;   COLONOSCOPY WITH PROPOFOL N/A 11/01/2020   Procedure: COLONOSCOPY WITH PROPOFOL;  Surgeon: Jonathon Bellows, MD;  Location: [facility] ENDOSCOPY;  Service: Gastroenterology;  Laterality: N/A;   CORONARY STENT INTERVENTION N/A 12/22/2020   Procedure: CORONARY STENT INTERVENTION;  Surgeon: [name], MD;  Location: [place] [facility];  Service: Cardiovascular;  Laterality: N/A;   ESOPHAGOGASTRODUODENOSCOPY N/A 11/01/2020   Procedure: ESOPHAGOGASTRODUODENOSCOPY (EGD);  Surgeon: Jonathon Bellows, MD;  Location: [facility] ENDOSCOPY;  Service: Gastroenterology;  Laterality: N/A;   ESOPHAGOGASTRODUODENOSCOPY (EGD) WITH PROPOFOL N/A 04/01/2018   Procedure: ESOPHAGOGASTRODUODENOSCOPY (EGD) WITH PROPOFOL;  Surgeon: Lollie Sails, MD;  Location: [facility] ENDOSCOPY;  Service: Endoscopy;  Laterality: N/A;   HERNIA REPAIR     JOINT REPLACEMENT Bilateral    hips  RT+  LEFT X2    LEFT HEART CATH AND CORONARY ANGIOGRAPHY N/A 12/22/2020   Procedure: LEFT HEART CATH AND CORONARY ANGIOGRAPHY;  Surgeon: [name], MD;  Location: [place] [facility];  Service: Cardiovascular;  Laterality: N/A;   LEFT HEART CATH AND CORONARY ANGIOGRAPHY Left 08/23/2021   Procedure: LEFT HEART CATH AND CORONARY ANGIOGRAPHY;  Surgeon: Corey Skains, MD;  Location: [place] [facility];  Service: Cardiovascular;  Laterality:  Left;   LUMBAR LAMINECTOMY/DECOMPRESSION MICRODISCECTOMY Left 09/13/2016   Procedure: Microdiscectomy - Lumbar two-three,  Lumbar three- - left;  Surgeon: Kary Kos, MD;  Location: [place];  Service: Neurosurgery;  Laterality: Left;   SPINAL CORD STIMULATOR INSERTION  07/08/2019   TONSILLECTOMY      Social History   Socioeconomic History   Marital status: Married    Spouse name: Malachy Mood    Number of children: 2   Years of education: Not on file   Highest education level: High school graduate  Occupational History   Occupation: retired   Tobacco Use   Smoking status: Never   Smokeless tobacco: Never  Vaping Use   Vaping Use: Never used  Substance and Sexual Activity   Alcohol use: No   Drug use: No   Sexual activity: Not on file  Other Topics Concern   Not on file  Social History Narrative   MarriedGets regular exercise.      Lives in [place] with wife/ daughter. Never smoked; no alcohol. 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    Social Determinants of Health   Financial Resource Strain: Low Risk  (09/12/2021)   Overall Financial Resource Strain (CARDIA)    Difficulty of Paying Living Expenses: Not hard at all  Food Insecurity: No Food Insecurity (09/12/2021)   Hunger Vital Sign    Worried About Running Out of Food in the Last Year: Never true    Ran Out of Food in the Last Year: Never true  Transportation Needs: No Transportation Needs (09/12/2021)   PRAPARE - Transportation    Lack  of Transportation (Medical): No    Lack of Transportation (Non-Medical): No  Physical Activity: Inactive (09/23/2021)   Exercise Vital Sign    Days of Exercise per Week: 0 days    Minutes of Exercise per Session: 0 min  Stress: No Stress Concern Present (09/12/2021)   [place]    Feeling of Stress : Only a little  Social Connections: Moderately Integrated (09/12/2021)   Social  Connection and Isolation Panel [NHANES]    Frequency of Communication with Friends and Family: Three times a week    Frequency of Social Gatherings with Friends and Family: Twice a week    Attends Religious Services: More than 4 times per year    Active Member of [employer] or Organizations: No    Attends Archivist Meetings: Never    Marital Status: Married  Human resources officer Violence: Not At Risk (09/12/2021)   Humiliation, Afraid, Rape, and Kick questionnaire    Fear of Current or Ex-Partner: No    Emotionally Abused: No    Physically Abused: No    Sexually Abused: No     Allergies  Allergen Reactions   Levaquin [Levofloxacin In D5w] Anaphylaxis and Shortness Of Breath   Shellfish Allergy Anaphylaxis    Has used duraprep, betadine and ioban in previous surgeries in 2019 and 2018 without issue   Amiodarone Other (See Comments)    Tremors and thyroid toxicity   Adhesive [Tape] Other (See Comments)    Little red bumps under the dressing.  He questions whether is latex related     CBC    Component Value Date/Time   WBC 5.5 10/07/2021 1408   RBC 3.72 (L) 10/07/2021 1408   HGB 11.5 (L) 10/07/2021 1408   HGB 11.6 (L) 09/05/2021 1529   HCT 33.9 (L) 10/07/2021 1408   HCT 34.0 (L) 09/05/2021 1529   PLT 175 10/07/2021 1408   PLT 198 09/05/2021 1529   MCV 91.1 10/07/2021 1408   MCV 90 09/05/2021 1529   MCH 30.9 10/07/2021 1408   MCHC 33.9 10/07/2021 1408   RDW 13.1 10/07/2021 1408   RDW 13.0 09/05/2021 1529   LYMPHSABS 1.7 10/07/2021 1408   LYMPHSABS 2.0 09/05/2021 1529   MONOABS 0.6 10/07/2021 1408   EOSABS 0.0 10/07/2021 1408   EOSABS 0.1 09/05/2021 1529   BASOSABS 0.0 10/07/2021 1408   BASOSABS 0.0 09/05/2021 1529    Pulmonary Functions Testing Results:    Latest Ref Rng & Units 10/26/2021    2:56 PM  PFT Results  FVC-Pre L 4.03   FVC-Predicted Pre % 87   FVC-Post L 4.03   FVC-Predicted Post % 87   Pre FEV1/FVC % % 58   Post FEV1/FCV % % 63   FEV1-Pre L 2.34    FEV1-Predicted Pre % 70   FEV1-Post L 2.54   DLCO uncorrected ml/min/mmHg 19.67   DLCO UNC% % 73   DLVA Predicted % 80   TLC L 6.11   TLC % Predicted % 79   RV % Predicted % 75     Outpatient Medications Prior to Visit  Medication Sig Dispense Refill   clopidogrel (PLAVIX) 75 MG tablet Take 75 mg by mouth daily.     cyanocobalamin 1000 MCG tablet Take by mouth.     ELIQUIS 5 MG TABS tablet Take 1 tablet (5 mg total) by mouth 2 (two) times daily. 180 tablet 1   ferrous sulfate 325 (65 FE) MG EC tablet Take 325  mg by mouth daily with breakfast.     finasteride (PROSCAR) 5 MG tablet Take 1 tablet (5 mg total) by mouth daily. 90 tablet 2   isosorbide mononitrate (IMDUR) 30 MG 24 hr tablet Take 30 mg by mouth daily.     metFORMIN (GLUCOPHAGE) 500 MG tablet Take 1 tablet (500 mg total) by mouth 2 (two) times daily with a meal. 180 tablet 4   metoprolol succinate (TOPROL XL) 25 MG 24 hr tablet Take 1 tablet (25 mg total) by mouth daily. 30 tablet 5   omeprazole (PRILOSEC) 40 MG capsule Take 40 mg by mouth daily.     rosuvastatin (CRESTOR) 5 MG tablet Take 1 tablet (5 mg total) by mouth daily. 90 tablet 0   solifenacin (VESICARE) 5 MG tablet Take 5 mg by mouth daily.     tamsulosin (FLOMAX) 0.4 MG CAPS capsule Take 0.8 mg by mouth daily.     No facility-administered medications prior to visit.

## 2021-10-27 NOTE — Patient Instructions (Signed)
Today, I have started you on an inhaler (Anoro Ellipta). Use it once daily in the morning. If you start noticing urinary retention or inability to urinate, stop the inhaler and give [place] a call.

## 2021-10-28 ENCOUNTER — Telehealth: Payer: Self-pay | Admitting: Cardiovascular Disease

## 2021-10-28 NOTE — Telephone Encounter (Signed)
Spoke with the patient and advised per PharmD he is okay to use Anoro inhaler. Patient verbalized understanding.

## 2021-10-28 NOTE — Telephone Encounter (Signed)
Pt c/o medication issue:  1. Name of Medication: Anoro  2. How are you currently taking this medication (dosage and times per day)?  1 time a day- it is inhaler  3. Are you having a reaction (difficulty breathing--STAT)?   4. What is your medication issue? He wants to know if this medicine that his pulmonary doctor wants him to take- he wants to know if it is alright for him to take this medicine with his medicine and his condition

## 2021-10-28 NOTE — Telephone Encounter (Signed)
Ok to take [employer] inhaler

## 2021-10-29 NOTE — Patient Instructions (Signed)
Fatigue If you have fatigue, you feel tired all the time and have a lack of energy or a lack of motivation. Fatigue may make it difficult to start or complete tasks because of exhaustion. Occasional or mild fatigue is often a normal response to activity or life. However, long-term (chronic) or extreme fatigue may be a symptom of a medical condition such as: Depression. Not having enough red blood cells or hemoglobin in the blood (anemia). A problem with a small gland located in the lower front part of the neck (thyroid disorder). Rheumatologic conditions. These are problems related to the body's defense system (immune system). Infections, especially certain viral infections. Fatigue can also lead to negative health outcomes over time. Follow these instructions at home: Medicines Take over-the-counter and prescription medicines only as told by your health care provider. Take a multivitamin if told by your health care provider. Do not use herbal or dietary supplements unless they are approved by your health care provider. Eating and drinking  Avoid heavy meals in the evening. Eat a well-balanced diet, which includes lean proteins, whole grains, plenty of fruits and vegetables, and low-fat dairy products. Avoid eating or drinking too many products with caffeine in them. Avoid alcohol. Drink enough fluid to keep your urine pale yellow. Activity  Exercise regularly, as told by your health care provider. Use or practice techniques to help you relax, such as yoga, tai chi, meditation, or massage therapy. Lifestyle Change situations that cause you stress. Try to keep your work and personal schedules in balance. Do not use recreational or illegal drugs. General instructions Monitor your fatigue for any changes. Go to bed and get up at the same time every day. Avoid fatigue by pacing yourself during the day and getting enough sleep at night. Maintain a healthy weight. Contact a health care  provider if: Your fatigue does not get better. You have a fever. You suddenly lose or gain weight. You have headaches. You have trouble falling asleep or sleeping through the night. You feel angry, guilty, anxious, or sad. You have swelling in your legs or another part of your body. Get help right away if: You feel confused, feel like you might faint, or faint. Your vision is blurry or you have a severe headache. You have severe pain in your abdomen, your back, or the area between your waist and hips (pelvis). You have chest pain, shortness of breath, or an irregular or fast heartbeat. You are unable to urinate, or you urinate less than normal. You have abnormal bleeding from the rectum, nose, lungs, nipples, or, if you are male, the vagina. You vomit blood. You have thoughts about hurting yourself or others. These symptoms may be an emergency. Get help right away. Call 911. Do not wait to see if the symptoms will go away. Do not drive yourself to the hospital. Get help right away if you feel like you may hurt yourself or others, or have thoughts about taking your own life. Go to your nearest emergency room or: Call 911. Call the [facility] at [phone] or 988. This is open 24 hours a day. Text the Crisis Text Line at 979-823-0385. Summary If you have fatigue, you feel tired all the time and have a lack of energy or a lack of motivation. Fatigue may make it difficult to start or complete tasks because of exhaustion. Long-term (chronic) or extreme fatigue may be a symptom of a medical condition. Exercise regularly, as told by your health care provider.  Change situations that cause you stress. Try to keep your work and personal schedules in balance. This information is not intended to replace advice given to you by your health care provider. Make sure you discuss any questions you have with your health care provider. Document Revised: 11/22/2020 Document  Reviewed: 11/22/2020 Elsevier Patient Education  2023 [employer] Inc.  

## 2021-10-31 ENCOUNTER — Ambulatory Visit (INDEPENDENT_AMBULATORY_CARE_PROVIDER_SITE_OTHER): Payer: Medicare Other | Admitting: Nurse Practitioner

## 2021-10-31 ENCOUNTER — Encounter: Payer: Self-pay | Admitting: Nurse Practitioner

## 2021-10-31 VITALS — BP 138/71 | HR 66 | Temp 98.0°F | Ht 73.0 in | Wt 185.6 lb

## 2021-10-31 DIAGNOSIS — G4733 Obstructive sleep apnea (adult) (pediatric): Secondary | ICD-10-CM

## 2021-10-31 DIAGNOSIS — R5383 Other fatigue: Secondary | ICD-10-CM

## 2021-10-31 DIAGNOSIS — J449 Chronic obstructive pulmonary disease, unspecified: Secondary | ICD-10-CM | POA: Insufficient documentation

## 2021-10-31 NOTE — Assessment & Plan Note (Addendum)
Improving at this time with consistent new CPAP use.  Has not started inhalers ordered by pulmonary, will wait on these at this time.  Continue to monitor closely.  Continue collaboration with pulmonary and cardiology.

## 2021-10-31 NOTE — Progress Notes (Signed)
BP 138/71 (BP Location: Left Arm, Cuff Size: Normal)   Pulse 66   Temp 98 F (36.7 C) (Oral)   Ht '6\' 1"'$  (1.854 m)   Wt 185 lb 9.6 oz (84.2 kg)   SpO2 98%   BMI 24.49 kg/m    Subjective:    Patient ID: Sean Reyes., male    DOB: 1940-09-16, 81 y.o.   MRN: [id number]  HPI: Parth Mccormac. is a 81 y.o. male  Chief Complaint  Patient presents with   Fatigue    Patient is here for four week follow up on Fatigue. Patient says his fatigue has gotten a little better and he is feeling better as well.    Medication Consultation    Patient says he was prescribed two inhalers (Albuterol and ANORO) and he would like to discuss with provider at today's visit.    COPD & FATIGUE Saw pulmonary for initial visit on 09/28/21, had follow-up with them 10/27/21.  They have started him on Anoro and Albuterol -- he has not started these due to concerns for urinary retention which he read about on label.  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    Feels symptoms may be improving has now has new CPAP and using consistently. COPD status: stable Satisfied with current treatment?: yes Oxygen use: no Dyspnea frequency: occasional with activity Cough frequency: none Rescue inhaler frequency:  no use Limitation of activity: no Productive cough: none Last Spirometry: 10/27/21 Pneumovax: Up to Date Influenza: Not up to Date   Relevant past medical, surgical, family and social history reviewed and updated as indicated. Interim medical history since our last visit reviewed. Allergies and medications reviewed and updated.  Review of Systems  Constitutional:  Negative for activity change, appetite change, diaphoresis, fatigue and fever.  Respiratory:  Negative for cough, chest tightness, shortness of breath and wheezing.   Cardiovascular:  Negative for chest pain,  palpitations and leg swelling.  Gastrointestinal: Negative.   Endocrine: Negative for cold intolerance, heat intolerance, polydipsia, polyphagia and polyuria.  Neurological: Negative.   Psychiatric/Behavioral: Negative.      Per HPI unless specifically indicated above     Objective:    BP 138/71 (BP Location: Left Arm, Cuff Size: Normal)   Pulse 66   Temp 98 F (36.7 C) (Oral)   Ht '6\' 1"'$  (1.854 m)   Wt 185 lb 9.6 oz (84.2 kg)   SpO2 98%   BMI 24.49 kg/m   Wt Readings from Last 3 Encounters:  10/31/21 185 lb 9.6 oz (84.2 kg)  10/27/21 185 lb 3.2 oz (84 kg)  10/07/21 183 lb 3.2 oz (83.1 kg)    Physical Exam Vitals and nursing note reviewed.  Constitutional:      General: He is not in acute distress.    Appearance: Normal appearance. He is not ill-appearing, toxic-appearing or diaphoretic.  HENT:     Head: Normocephalic.     Right Ear: External ear normal.     Left Ear: External ear normal.     Nose: Nose normal. No congestion or rhinorrhea.     Mouth/Throat:     Mouth: Mucous membranes are moist.  Eyes:     General:        Right eye: No discharge.        Left eye: No discharge.  Extraocular Movements: Extraocular movements intact.     Conjunctiva/sclera: Conjunctivae normal.     Pupils: Pupils are equal, round, and reactive to light.  Cardiovascular:     Rate and Rhythm: Normal rate and regular rhythm.     Heart sounds: No murmur heard. Pulmonary:     Effort: Pulmonary effort is normal. No respiratory distress.     Breath sounds: Normal breath sounds. No wheezing, rhonchi or rales.  Abdominal:     General: Abdomen is flat. Bowel sounds are normal.  Musculoskeletal:     Cervical back: Normal range of motion and neck supple.  Skin:    General: Skin is warm and dry.     Capillary Refill: Capillary refill takes less than 2 seconds.  Neurological:     General: No focal deficit present.     Mental Status: He is alert and oriented to person, place, and time.   Psychiatric:        Mood and Affect: Mood normal.        Behavior: Behavior normal.        Thought Content: Thought content normal.        Judgment: Judgment normal.     Results for orders placed or performed in visit on 10/26/21  Pulmonary Function Test [facility] Only  Result Value Ref Range   FVC-Pre 4.03 L   FVC-%Pred-Pre 87 %   FVC-Post 4.03 L   FVC-%Pred-Post 87 %   FVC-%Change-Post 0 %   FEV1-Pre 2.34 L   FEV1-%Pred-Pre 70 %   FEV1-Post 2.54 L   FEV1-%Pred-Post 77 %   FEV1-%Change-Post 8 %   FEV6-Pre 4.02 L   FEV6-%Pred-Pre 93 %   FEV6-Post 3.78 L   FEV6-%Pred-Post 87 %   FEV6-%Change-Post -6 %   Pre FEV1/FVC ratio 58 %   FEV1FVC-%Pred-Pre 80 %   Post FEV1/FVC ratio 63 %   FEV1FVC-%Change-Post 8 %   Pre FEV6/FVC Ratio 100 %   FEV6FVC-%Pred-Pre 106 %   Post FEV6/FVC ratio 100 %   FEV6FVC-%Pred-Post 106 %   FEV6FVC-%Change-Post 0 %   FEF 25-75 Pre 1.12 L/sec   FEF2575-%Pred-Pre 48 %   FEF 25-75 Post 2.06 L/sec   FEF2575-%Pred-Post 89 %   FEF2575-%Change-Post 83 %   RV 2.11 L   RV % pred 75 %   TLC 6.11 L   TLC % pred 79 %   DLCO unc 19.67 ml/min/mmHg   DLCO unc % pred 73 %   DL/VA 3.09 ml/min/mmHg/L   DL/VA % pred 80 %      Assessment & Plan:   Problem List Items Addressed This Visit       Respiratory   COPD (chronic obstructive pulmonary disease) (HCC) - Primary    Followed by pulmonary at this time, continue this collaboration.  Hold on starting inhaler regimen due to his concerns with his urinary system and BPH.      OSA (obstructive sleep apnea)    Chronic, ongoing -- continue 100% use of CPAP.        Other   Fatigue    Improving at this time with consistent new CPAP use.  Has not started inhalers ordered by pulmonary, will wait on these at this time.  Continue to monitor closely.  Continue collaboration with pulmonary and cardiology.        Follow up plan: Return in about 2 months (around 01/03/2022) for T2DM, HTN/HLD, COPD, OSA,  CKD.

## 2021-10-31 NOTE — Assessment & Plan Note (Signed)
Followed by pulmonary at this time, continue this collaboration.  Hold on starting inhaler regimen due to his concerns with his urinary system and BPH.

## 2021-10-31 NOTE — Assessment & Plan Note (Signed)
Chronic, ongoing -- continue 100% use of CPAP. 

## 2021-11-11 ENCOUNTER — Ambulatory Visit
Admission: RE | Admit: 2021-11-11 | Discharge: 2021-11-11 | Disposition: A | Discharge status: Home / Self Care | Payer: Medicare Other | Source: Ambulatory Visit | Attending: Student in an Organized Health Care Education/Training Program | Admitting: Student in an Organized Health Care Education/Training Program

## 2021-11-11 DIAGNOSIS — R918 Other nonspecific abnormal finding of lung field: Secondary | ICD-10-CM | POA: Diagnosis not present

## 2021-11-11 DIAGNOSIS — J449 Chronic obstructive pulmonary disease, unspecified: Secondary | ICD-10-CM | POA: Diagnosis not present

## 2021-11-11 DIAGNOSIS — R0602 Shortness of breath: Secondary | ICD-10-CM | POA: Insufficient documentation

## 2021-11-14 ENCOUNTER — Ambulatory Visit (INDEPENDENT_AMBULATORY_CARE_PROVIDER_SITE_OTHER): Payer: Medicare Other | Admitting: Internal Medicine

## 2021-11-14 VITALS — BP 135/82 | HR 69 | Resp 18 | Ht 73.0 in | Wt 186.0 lb

## 2021-11-14 DIAGNOSIS — Z7189 Other specified counseling: Secondary | ICD-10-CM | POA: Insufficient documentation

## 2021-11-14 DIAGNOSIS — I5030 Unspecified diastolic (congestive) heart failure: Secondary | ICD-10-CM

## 2021-11-14 DIAGNOSIS — G4733 Obstructive sleep apnea (adult) (pediatric): Secondary | ICD-10-CM | POA: Diagnosis not present

## 2021-11-14 DIAGNOSIS — I2510 Atherosclerotic heart disease of native coronary artery without angina pectoris: Secondary | ICD-10-CM | POA: Diagnosis not present

## 2021-11-14 NOTE — Progress Notes (Signed)
[facility], [place] [address]  Pulmonary Sleep Medicine   Office Visit Note  Patient Name: Sean Reyes. DOB: Jan 21, 1941 MRN [id number]    Chief Complaint: Obstructive Sleep Apnea visit  Brief History:  Sean Reyes is seen today for a follow up 2 months after new setup on CPAP at 8 cmh20. The patient has a 13 year history of sleep apnea. Patient is using PAP nightly.  The patient feels rested after sleeping with PAP.  The patient reports benefits from PAP use. Reported sleepiness is  improved and the Epworth Sleepiness Score is 7 out of 24. The patient does take naps, 2-3 times per week for about an hour. The patient complains of the following: No complaints with the therapy.  He does have frequent awakenings due to needing to go to the bathroom. The compliance download shows 93% compliance with an average use time of 6 hours 32 minutes. The AHI is 10.4.  The patient does not complain of limb movements disrupting sleep.  ROS  General: (-) fever, (-) chills, (-) night sweat Nose and Sinuses: (-) nasal stuffiness or itchiness, (-) postnasal drip, (-) nosebleeds, (-) sinus trouble. Mouth and Throat: (-) sore throat, (-) hoarseness. Neck: (-) swollen glands, (-) enlarged thyroid, (-) neck pain. Respiratory: - cough, - shortness of breath, - wheezing. Neurologic: - numbness, - tingling. Psychiatric: - anxiety, - depression   Current Medication: Outpatient Encounter Medications as of 11/14/2021  Medication Sig   albuterol (VENTOLIN HFA) 108 (90 Base) MCG/ACT inhaler Inhale 2 puffs into the lungs every 6 (six) hours as needed for wheezing or shortness of breath.   clopidogrel (PLAVIX) 75 MG tablet Take 75 mg by mouth daily.   cyanocobalamin 1000 MCG tablet Take by mouth.   ELIQUIS 5 MG TABS tablet Take 1 tablet (5 mg total) by mouth 2 (two) times daily.   ferrous sulfate 325 (65 FE) MG EC tablet Take 325 mg by mouth daily with breakfast.   finasteride  (PROSCAR) 5 MG tablet Take 1 tablet (5 mg total) by mouth daily.   isosorbide mononitrate (IMDUR) 30 MG 24 hr tablet Take 30 mg by mouth daily.   metFORMIN (GLUCOPHAGE) 500 MG tablet Take 1 tablet (500 mg total) by mouth 2 (two) times daily with a meal.   metoprolol succinate (TOPROL XL) 25 MG 24 hr tablet Take 1 tablet (25 mg total) by mouth daily.   omeprazole (PRILOSEC) 40 MG capsule Take 40 mg by mouth daily.   rosuvastatin (CRESTOR) 5 MG tablet Take 1 tablet (5 mg total) by mouth daily.   solifenacin (VESICARE) 5 MG tablet Take 5 mg by mouth daily.   tamsulosin (FLOMAX) 0.4 MG CAPS capsule Take 0.8 mg by mouth daily.   umeclidinium-vilanterol (ANORO ELLIPTA) 62.5-25 MCG/ACT AEPB Inhale 1 puff into the lungs daily.   No facility-administered encounter medications on file as of 11/14/2021.    Surgical History: Past Surgical History:  Procedure Laterality Date   ABLATION     ANTERIOR LAT LUMBAR FUSION N/A 06/27/2017   Procedure: Anterior Lateral Lumbar Interbody  Fusion - Lumbar Two-Lumbar Three - Lumbar Three-Lumbar Four, Posterior Lumbar Interbody Fusion Lumbar Four- Five;  Surgeon: Kary Kos, MD;  Location: [place];  Service: Neurosurgery;  Laterality: N/A;  Anterior Lateral Lumbar Interbody  Fusion - Lumbar Two-Lumbar Three - Lumbar Three-Lumbar Four, Posterior Lumbar Interbody Fusion Lumbar Four- Five   BACK SURGERY     CARDIAC CATHETERIZATION     CARDIOVERSION N/A 08/29/2018  Procedure: CARDIOVERSION;  Surgeon: Corey Skains, MD;  Location: [facility];  Service: Cardiovascular;  Laterality: N/A;   CARDIOVERSION N/A 09/24/2018   Procedure: CARDIOVERSION;  Surgeon: Corey Skains, MD;  Location: [facility];  Service: Cardiovascular;  Laterality: N/A;   COLONOSCOPY WITH PROPOFOL N/A 10/05/2015   Procedure: COLONOSCOPY WITH PROPOFOL;  Surgeon: Lollie Sails, MD;  Location: [facility] ENDOSCOPY;  Service: Endoscopy;  Laterality: N/A;   COLONOSCOPY WITH PROPOFOL N/A 11/01/2020    Procedure: COLONOSCOPY WITH PROPOFOL;  Surgeon: Jonathon Bellows, MD;  Location: [facility] ENDOSCOPY;  Service: Gastroenterology;  Laterality: N/A;   CORONARY STENT INTERVENTION N/A 12/22/2020   Procedure: CORONARY STENT INTERVENTION;  Surgeon: [name], MD;  Location: [place] CV LAB;  Service: Cardiovascular;  Laterality: N/A;   ESOPHAGOGASTRODUODENOSCOPY N/A 11/01/2020   Procedure: ESOPHAGOGASTRODUODENOSCOPY (EGD);  Surgeon: Jonathon Bellows, MD;  Location: [facility] ENDOSCOPY;  Service: Gastroenterology;  Laterality: N/A;   ESOPHAGOGASTRODUODENOSCOPY (EGD) WITH PROPOFOL N/A 04/01/2018   Procedure: ESOPHAGOGASTRODUODENOSCOPY (EGD) WITH PROPOFOL;  Surgeon: Lollie Sails, MD;  Location: [facility] ENDOSCOPY;  Service: Endoscopy;  Laterality: N/A;   HERNIA REPAIR     JOINT REPLACEMENT Bilateral    hips  RT+  LEFT X2    LEFT HEART CATH AND CORONARY ANGIOGRAPHY N/A 12/22/2020   Procedure: LEFT HEART CATH AND CORONARY ANGIOGRAPHY;  Surgeon: [name], MD;  Location: [place] CV LAB;  Service: Cardiovascular;  Laterality: N/A;   LEFT HEART CATH AND CORONARY ANGIOGRAPHY Left 08/23/2021   Procedure: LEFT HEART CATH AND CORONARY ANGIOGRAPHY;  Surgeon: Corey Skains, MD;  Location: [place] [facility];  Service: Cardiovascular;  Laterality: Left;   LUMBAR LAMINECTOMY/DECOMPRESSION MICRODISCECTOMY Left 09/13/2016   Procedure: Microdiscectomy - Lumbar two-three,  Lumbar three- - left;  Surgeon: Kary Kos, MD;  Location: [place];  Service: Neurosurgery;  Laterality: Left;   SPINAL CORD STIMULATOR INSERTION  07/08/2019   TONSILLECTOMY      Medical History: Past Medical History:  Diagnosis Date   Anemia    Anxiety    Arthritis    Arthritis of neck    Atrial fibrillation (HCC)    Cataracts, bilateral    Complication of anesthesia    pt reports low BP's after surgery at [facility] and difficulty awakening   Depression    Diabetes ([place])    dx 6-8 yrs ago   Dysrhythmia    a-fib    GERD (gastroesophageal reflux disease)    OCC TAKES ALKA SELTZER   History of kidney stones    10-15 yrs ago   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   Hyperlipidemia    Hypertension    Myocardial infarction ([place]) 12/2020   Nocturia    S/P ablation of atrial fibrillation    Ablative therapy   Sleep apnea    CPAP    Spinal stenosis    Tachycardia, unspecified     Family History: Non contributory to the present illness  Social History: Social History   Socioeconomic History   Marital status: Married    Spouse name: Malachy Mood    Number of children: 2   Years of education: Not on file   Highest education level: High school graduate  Occupational History   Occupation: retired   Tobacco Use   Smoking status: Never   Smokeless tobacco: Never  Vaping Use   Vaping Use: Never used  Substance and Sexual Activity   Alcohol use: No   Drug use: No   Sexual activity:  Not on file  Other Topics Concern   Not on file  Social History Narrative   MarriedGets regular exercise.      Lives in [place] with wife/ daughter. Never smoked; no alcohol. 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    Social Determinants of Health   Financial Resource Strain: Low Risk  (09/12/2021)   Overall Financial Resource Strain (CARDIA)    Difficulty of Paying Living Expenses: Not hard at all  Food Insecurity: No Food Insecurity (09/12/2021)   Hunger Vital Sign    Worried About Running Out of Food in the Last Year: Never true    Ran Out of Food in the Last Year: Never true  Transportation Needs: No Transportation Needs (09/12/2021)   PRAPARE - Hydrologist (Medical): No    Lack of Transportation (Non-Medical): No  Physical Activity: Inactive (09/23/2021)   Exercise Vital Sign    Days of Exercise per Week: 0 days    Minutes of Exercise per Session: 0 min  Stress: No Stress Concern Present (09/12/2021)   [place]    Feeling of Stress : Only a little  Social Connections: Moderately Integrated (09/12/2021)   Social Connection and Isolation Panel [NHANES]    Frequency of Communication with Friends and Family: Three times a week    Frequency of Social Gatherings with Friends and Family: Twice a week    Attends Religious Services: More than 4 times per year    Active Member of [employer] or Organizations: No    Attends Archivist Meetings: Never    Marital Status: Married  Human resources officer Violence: Not At Risk (09/12/2021)   Humiliation, Afraid, Rape, and Kick questionnaire    Fear of Current or Ex-Partner: No    Emotionally Abused: No    Physically Abused: No    Sexually Abused: No    Vital Signs: Blood pressure 135/82, pulse 69, resp. rate 18, height 6' 1"  (1.854 m), weight 186 lb (84.4 kg), SpO2 99 %. Body mass index is 24.54 kg/m.    Examination: General Appearance: The patient is well-developed, well-nourished, and in no distress. Neck Circumference: 41 cm Skin: Gross inspection of skin unremarkable. Head: normocephalic, no gross deformities. Eyes: no gross deformities noted. ENT: ears appear grossly normal Neurologic: Alert and oriented. No involuntary movements.  STOP BANG RISK ASSESSMENT S (snore) Have you been told that you snore?     NO   T (tired) Are you often tired, fatigued, or sleepy during the day?   NO  O (obstruction) Do you stop breathing, choke, or gasp during sleep? NO   P (pressure) Do you have or are you being treated for high blood pressure? YES   B (BMI) Is your body index greater than 35 kg/m? NO   A (age) Are you 45 years old or older? YES   N (neck) Do you have a neck circumference greater than 16 inches?   YES   G (gender) Are you a male? YES   TOTAL STOP/BANG "YES" ANSWERS 4       A STOP-Bang score of 2 or less is considered low risk, and a score of 5 or more is high risk for having  either moderate or severe OSA. For people who score 3 or 4, doctors may need to perform further assessment to determine how likely they are to have OSA.         EPWORTH  SLEEPINESS SCALE:  Scale:  (0)= no chance of dozing; (1)= slight chance of dozing; (2)= moderate chance of dozing; (3)= high chance of dozing  Chance  Situtation    Sitting and reading: 2    Watching TV: 1    Sitting Inactive in public: 0    As a passenger in car: 1      Lying down to rest: 2    Sitting and talking: 0    Sitting quielty after lunch: 1    In a car, stopped in traffic: 0   TOTAL SCORE:   7 out of 24    SLEEP STUDIES:  HST (05/12/21)  REI 14, SPO2  83% TITRATION (07/19/21)  CPAP at 8 cmh20   CPAP COMPLIANCE DATA:  Date Range: 09/15/21 - 11/09/21  Average Daily Use: 6 hours 32 minutes  Median Use: 6 hours 48 minutes  Compliance for > 4 Hours: 52 days  AHI: 10.4 respiratory events per hour  Days Used: 55/56  Mask Leak: 26.8  95th Percentile Pressure: 8 cmh20         LABS: Recent Results (from the past 2160 hour(s))  T4, free     Status: None   Collection Time: 08/19/21 11:47 AM  Result Value Ref Range   Free T4 1.44 0.82 - 1.77 ng/dL  TSH     Status: None   Collection Time: 08/19/21 11:47 AM  Result Value Ref Range   TSH 4.150 0.450 - 4.500 uIU/mL  Glucose, capillary     Status: Abnormal   Collection Time: 08/23/21 11:18 AM  Result Value Ref Range   Glucose-Capillary 138 (H) 70 - 99 mg/dL    Comment: Glucose reference range applies only to samples taken after fasting for at least 8 hours.  Comp Met (CMET)     Status: Abnormal   Collection Time: 09/05/21  3:29 PM  Result Value Ref Range   Glucose 152 (H) 70 - 99 mg/dL   BUN 19 8 - 27 mg/dL   Creatinine, Ser 1.40 (H) 0.76 - 1.27 mg/dL   eGFR 51 (L) >59 mL/min/1.73   BUN/Creatinine Ratio 14 10 - 24   Sodium 136 134 - 144 mmol/L   Potassium 4.4 3.5 - 5.2 mmol/L   Chloride 98 96 - 106 mmol/L   CO2 24 20 - 29  mmol/L   Calcium 9.6 8.6 - 10.2 mg/dL   Total Protein 6.7 6.0 - 8.5 g/dL   Albumin 4.0 3.8 - 4.8 g/dL    Comment:               **Please note reference interval change**   Globulin, Total 2.7 1.5 - 4.5 g/dL   Albumin/Globulin Ratio 1.5 1.2 - 2.2   Bilirubin Total 0.2 0.0 - 1.2 mg/dL   Alkaline Phosphatase 72 44 - 121 IU/L   AST 17 0 - 40 IU/L   ALT 16 0 - 44 IU/L  CBC w/Diff     Status: Abnormal   Collection Time: 09/05/21  3:29 PM  Result Value Ref Range   WBC 5.5 3.4 - 10.8 x10E3/uL   RBC 3.77 (L) 4.14 - 5.80 x10E6/uL   Hemoglobin 11.6 (L) 13.0 - 17.7 g/dL   Hematocrit 34.0 (L) 37.5 - 51.0 %   MCV 90 79 - 97 fL   MCH 30.8 26.6 - 33.0 pg   MCHC 34.1 31.5 - 35.7 g/dL   RDW 13.0 11.6 - 15.4 %   Platelets 198 150 - 450 x10E3/uL   Neutrophils  50 Not Estab. %   Lymphs 36 Not Estab. %   Monocytes 13 Not Estab. %   Eos 1 Not Estab. %   Basos 0 Not Estab. %   Neutrophils Absolute 2.8 1.4 - 7.0 x10E3/uL   Lymphocytes Absolute 2.0 0.7 - 3.1 x10E3/uL   Monocytes Absolute 0.7 0.1 - 0.9 x10E3/uL   EOS (ABSOLUTE) 0.1 0.0 - 0.4 x10E3/uL   Basophils Absolute 0.0 0.0 - 0.2 x10E3/uL   Immature Granulocytes 0 Not Estab. %   Immature Grans (Abs) 0.0 0.0 - 0.1 x10E3/uL  ECHOCARDIOGRAM COMPLETE     Status: None   Collection Time: 09/07/21  3:36 PM  Result Value Ref Range   AR max vel 3.24 cm2   AV Peak grad 6.1 mmHg   Ao pk vel 1.23 m/s   S' Lateral 1.80 cm   Area-P 1/2 2.02 cm2   AV Area VTI 3.48 cm2   AV Mean grad 3.0 mmHg   Single Plane A4C EF 52.8 %   Single Plane A2C EF 60.8 %   Calc EF 58.7 %   AV Area mean vel 3.37 cm2  HM DIABETES EYE EXAM     Status: None   Collection Time: 09/19/21 12:00 AM  Result Value Ref Range   HM Diabetic Eye Exam No Retinopathy No Retinopathy  Bayer DCA Hb A1c Waived     Status: Abnormal   Collection Time: 10/03/21  1:39 PM  Result Value Ref Range   HB A1C (BAYER DCA - WAIVED) 6.4 (H) 4.8 - 5.6 %    Comment:          Prediabetes: 5.7 - 6.4           Diabetes: >6.4          Glycemic control for adults with diabetes: <7.0   T4, free     Status: None   Collection Time: 10/03/21  1:41 PM  Result Value Ref Range   Free T4 1.30 0.82 - 1.77 ng/dL  TSH     Status: None   Collection Time: 10/03/21  1:41 PM  Result Value Ref Range   TSH 3.710 0.450 - 4.500 uIU/mL  Ferritin     Status: None   Collection Time: 10/07/21  2:08 PM  Result Value Ref Range   Ferritin 106 24 - 336 ng/mL    Comment: Performed at [facility], [address] [place], [place] [address]  Iron and TIBC     Status: None   Collection Time: 10/07/21  2:08 PM  Result Value Ref Range   Iron 68 45 - 182 ug/dL   TIBC 326 250 - 450 ug/dL   Saturation Ratios 21 17.9 - 39.5 %   UIBC 258 ug/dL    Comment: Performed at [facility], [address] [place], [place] [address]  Comprehensive metabolic panel     Status: Abnormal   Collection Time: 10/07/21  2:08 PM  Result Value Ref Range   Sodium 133 (L) 135 - 145 mmol/L   Potassium 4.2 3.5 - 5.1 mmol/L   Chloride 98 98 - 111 mmol/L   CO2 26 22 - 32 mmol/L   Glucose, Bld 171 (H) 70 - 99 mg/dL    Comment: Glucose reference range applies only to samples taken after fasting for at least 8 hours.   BUN 23 8 - 23 mg/dL   Creatinine, Ser 1.28 (H) 0.61 - 1.24 mg/dL   Calcium 9.2 8.9 - 10.3 mg/dL   Total  Protein 7.3 6.5 - 8.1 g/dL   Albumin 3.6 3.5 - 5.0 g/dL   AST 19 15 - 41 U/L   ALT 16 0 - 44 U/L   Alkaline Phosphatase 53 38 - 126 U/L   Total Bilirubin 0.6 0.3 - 1.2 mg/dL   GFR, Estimated 57 (L) >60 mL/min    Comment: (NOTE) Calculated using the CKD-EPI Creatinine Equation (2021)    Anion gap 9 5 - 15    Comment: Performed at [facility], [address] [place], [place] [address]  CBC with Differential/Platelet     Status: Abnormal   Collection Time: 10/07/21  2:08 PM  Result Value Ref Range   WBC 5.5 4.0 - 10.5 K/uL   RBC 3.72 (L) 4.22 - 5.81 MIL/uL   Hemoglobin 11.5 (L) 13.0 - 17.0  g/dL   HCT 33.9 (L) 39.0 - 52.0 %   MCV 91.1 80.0 - 100.0 fL   MCH 30.9 26.0 - 34.0 pg   MCHC 33.9 30.0 - 36.0 g/dL   RDW 13.1 11.5 - 15.5 %   Platelets 175 150 - 400 K/uL   nRBC 0.0 0.0 - 0.2 %   Neutrophils Relative % 57 %   Neutro Abs 3.2 1.7 - 7.7 K/uL   Lymphocytes Relative 31 %   Lymphs Abs 1.7 0.7 - 4.0 K/uL   Monocytes Relative 11 %   Monocytes Absolute 0.6 0.1 - 1.0 K/uL   Eosinophils Relative 1 %   Eosinophils Absolute 0.0 0.0 - 0.5 K/uL   Basophils Relative 0 %   Basophils Absolute 0.0 0.0 - 0.1 K/uL   Immature Granulocytes 0 %   Abs Immature Granulocytes 0.02 0.00 - 0.07 K/uL    Comment: Performed at [facility], [address] [place], [place] [address]  Brain natriuretic peptide     Status: None   Collection Time: 10/07/21  2:26 PM  Result Value Ref Range   B Natriuretic Peptide 74.1 0.0 - 100.0 pg/mL    Comment: Performed at [facility], [address] [place], [place] [address]  Pulmonary Function Test [facility] Only     Status: None   Collection Time: 10/26/21  2:56 PM  Result Value Ref Range   FVC-Pre 4.03 L   FVC-%Pred-Pre 87 %   FVC-Post 4.03 L   FVC-%Pred-Post 87 %   FVC-%Change-Post 0 %   FEV1-Pre 2.34 L   FEV1-%Pred-Pre 70 %   FEV1-Post 2.54 L   FEV1-%Pred-Post 77 %   FEV1-%Change-Post 8 %   FEV6-Pre 4.02 L   FEV6-%Pred-Pre 93 %   FEV6-Post 3.78 L   FEV6-%Pred-Post 87 %   FEV6-%Change-Post -6 %   Pre FEV1/FVC ratio 58 %   FEV1FVC-%Pred-Pre 80 %   Post FEV1/FVC ratio 63 %   FEV1FVC-%Change-Post 8 %   Pre FEV6/FVC Ratio 100 %   FEV6FVC-%Pred-Pre 106 %   Post FEV6/FVC ratio 100 %   FEV6FVC-%Pred-Post 106 %   FEV6FVC-%Change-Post 0 %   FEF 25-75 Pre 1.12 L/sec   FEF2575-%Pred-Pre 48 %   FEF 25-75 Post 2.06 L/sec   FEF2575-%Pred-Post 89 %   FEF2575-%Change-Post 83 %   RV 2.11 L   RV % pred 75 %   TLC 6.11 L   TLC % pred 79 %   DLCO unc 19.67 ml/min/mmHg   DLCO unc % pred 73 %   DL/VA 3.09 ml/min/mmHg/L   DL/VA % pred  80 %    Radiology: CT CHEST WO CONTRAST  Result  Date: 11/13/2021 CLINICAL DATA:  Dyspnea, chronic, unclear etiology EXAM: CT CHEST WITHOUT CONTRAST TECHNIQUE: Multidetector CT imaging of the chest was performed following the standard protocol without IV contrast. RADIATION DOSE REDUCTION: This exam was performed according to the departmental dose-optimization program which includes automated exposure control, adjustment of the mA and/or kV according to patient size and/or use of iterative reconstruction technique. COMPARISON:  CT lumbar insert spine 06/23/2021, chest x-ray 08/05/2021 FINDINGS: Cardiovascular: Normal heart size. No significant pericardial effusion. Enlarged ascending thoracic aorta caliber measuring up to 4 cm. The descending thoracic aorta is normal in caliber. Moderate severe atherosclerotic plaque. Severe atherosclerotic plaque of the thoracic aorta. At least 3 vessel coronary artery calcifications. The main pulmonary artery measures at the upper limits of normal. Mediastinum/Nodes: No gross hilar adenopathy, noting limited sensitivity for the detection of hilar adenopathy on this noncontrast study. No enlarged mediastinal or axillary lymph nodes. Thyroid gland, trachea, and esophagus demonstrate no significant findings. Lungs/Pleura: Azygous fissure noted. No focal consolidation. A triangular 4 mm subpleural pulmonary nodule along the left major fissure likely representing an intrapulmonary lymph node. Triangular subpleural 6 x 5 mm subpleural nodule along the right major fissure likely representing an intrapulmonary lymph node. No pulmonary mass. No pleural effusion. No pneumothorax. Upper Abdomen: No acute abnormality. Musculoskeletal: No chest wall abnormality. Neural stimulator leads entering at the T12-L1 level with tip terminating at in the posterior central canal at the C7-T10 levels. No suspicious lytic or blastic osseous lesions. No acute displaced fracture. Multilevel degenerative  changes of the spine. IMPRESSION: 1. Aneurysmal ascending thoracic aorta (4 cm). Recommend annual imaging followup by CTA or MRA. This recommendation follows 2010 ACCF/AHA/AATS/ACR/ASA/SCA/SCAI/SIR/STS/SVM Guidelines for the Diagnosis and Management of Patients with Thoracic Aortic Disease. Circulation. 2010; 121: Y503-T465. Aortic aneurysm NOS (ICD10-I71.9). 2. Aortic Atherosclerosis (ICD10-I70.0) with at least 3 vessel coronary calcification. 3.  No acute intrathoracic abnormality. Electronically Signed   By: Iven Finn M.D.   On: 11/13/2021 21:00    CT CHEST WO CONTRAST  Result Date: 11/13/2021 CLINICAL DATA:  Dyspnea, chronic, unclear etiology EXAM: CT CHEST WITHOUT CONTRAST TECHNIQUE: Multidetector CT imaging of the chest was performed following the standard protocol without IV contrast. RADIATION DOSE REDUCTION: This exam was performed according to the departmental dose-optimization program which includes automated exposure control, adjustment of the mA and/or kV according to patient size and/or use of iterative reconstruction technique. COMPARISON:  CT lumbar insert spine 06/23/2021, chest x-ray 08/05/2021 FINDINGS: Cardiovascular: Normal heart size. No significant pericardial effusion. Enlarged ascending thoracic aorta caliber measuring up to 4 cm. The descending thoracic aorta is normal in caliber. Moderate severe atherosclerotic plaque. Severe atherosclerotic plaque of the thoracic aorta. At least 3 vessel coronary artery calcifications. The main pulmonary artery measures at the upper limits of normal. Mediastinum/Nodes: No gross hilar adenopathy, noting limited sensitivity for the detection of hilar adenopathy on this noncontrast study. No enlarged mediastinal or axillary lymph nodes. Thyroid gland, trachea, and esophagus demonstrate no significant findings. Lungs/Pleura: Azygous fissure noted. No focal consolidation. A triangular 4 mm subpleural pulmonary nodule along the left major fissure  likely representing an intrapulmonary lymph node. Triangular subpleural 6 x 5 mm subpleural nodule along the right major fissure likely representing an intrapulmonary lymph node. No pulmonary mass. No pleural effusion. No pneumothorax. Upper Abdomen: No acute abnormality. Musculoskeletal: No chest wall abnormality. Neural stimulator leads entering at the T12-L1 level with tip terminating at in the posterior central canal at the C7-T10 levels. No suspicious lytic or blastic osseous  lesions. No acute displaced fracture. Multilevel degenerative changes of the spine. IMPRESSION: 1. Aneurysmal ascending thoracic aorta (4 cm). Recommend annual imaging followup by CTA or MRA. This recommendation follows 2010 ACCF/AHA/AATS/ACR/ASA/SCA/SCAI/SIR/STS/SVM Guidelines for the Diagnosis and Management of Patients with Thoracic Aortic Disease. Circulation. 2010; 121: [id number]. Aortic aneurysm NOS (ICD10-I71.9). 2. Aortic Atherosclerosis (ICD10-I70.0) with at least 3 vessel coronary calcification. 3.  No acute intrathoracic abnormality. Electronically Signed   By: Iven Finn M.D.   On: 11/13/2021 21:00    CT CHEST WO CONTRAST  Result Date: 11/13/2021 CLINICAL DATA:  Dyspnea, chronic, unclear etiology EXAM: CT CHEST WITHOUT CONTRAST TECHNIQUE: Multidetector CT imaging of the chest was performed following the standard protocol without IV contrast. RADIATION DOSE REDUCTION: This exam was performed according to the departmental dose-optimization program which includes automated exposure control, adjustment of the mA and/or kV according to patient size and/or use of iterative reconstruction technique. COMPARISON:  CT lumbar insert spine 06/23/2021, chest x-ray 08/05/2021 FINDINGS: Cardiovascular: Normal heart size. No significant pericardial effusion. Enlarged ascending thoracic aorta caliber measuring up to 4 cm. The descending thoracic aorta is normal in caliber. Moderate severe atherosclerotic plaque. Severe atherosclerotic  plaque of the thoracic aorta. At least 3 vessel coronary artery calcifications. The main pulmonary artery measures at the upper limits of normal. Mediastinum/Nodes: No gross hilar adenopathy, noting limited sensitivity for the detection of hilar adenopathy on this noncontrast study. No enlarged mediastinal or axillary lymph nodes. Thyroid gland, trachea, and esophagus demonstrate no significant findings. Lungs/Pleura: Azygous fissure noted. No focal consolidation. A triangular 4 mm subpleural pulmonary nodule along the left major fissure likely representing an intrapulmonary lymph node. Triangular subpleural 6 x 5 mm subpleural nodule along the right major fissure likely representing an intrapulmonary lymph node. No pulmonary mass. No pleural effusion. No pneumothorax. Upper Abdomen: No acute abnormality. Musculoskeletal: No chest wall abnormality. Neural stimulator leads entering at the T12-L1 level with tip terminating at in the posterior central canal at the C7-T10 levels. No suspicious lytic or blastic osseous lesions. No acute displaced fracture. Multilevel degenerative changes of the spine. IMPRESSION: 1. Aneurysmal ascending thoracic aorta (4 cm). Recommend annual imaging followup by CTA or MRA. This recommendation follows 2010 ACCF/AHA/AATS/ACR/ASA/SCA/SCAI/SIR/STS/SVM Guidelines for the Diagnosis and Management of Patients with Thoracic Aortic Disease. Circulation. 2010; 121: X528-U132. Aortic aneurysm NOS (ICD10-I71.9). 2. Aortic Atherosclerosis (ICD10-I70.0) with at least 3 vessel coronary calcification. 3.  No acute intrathoracic abnormality. Electronically Signed   By: Iven Finn M.D.   On: 11/13/2021 21:00      Assessment and Plan: Patient Active Problem List   Diagnosis Date Noted   CPAP use counseling 11/14/2021   COPD (chronic obstructive pulmonary disease) ([place]) 10/31/2021   Shortness of breath on exertion 08/04/2021   Bradycardia 06/15/2021   Diabetes mellitus with proteinuria  ([place]) 04/04/2021   GERD without esophagitis 04/02/2021   Coronary artery disease 12/28/2020   History of non-ST elevation myocardial infarction (NSTEMI) 12/21/2020   Pain in right shin 10/25/2020   Peripheral vascular disease ([place]) 08/06/2020   Chronic pain syndrome 07/06/2020   Cervical facet joint syndrome 04/08/2020   Spinal cord stimulator status 12/11/2019   Elevated TSH 08/20/2019   Fatigue 07/30/2019   CKD (chronic kidney disease) stage 3, GFR 30-59 ml/min ([place]) 01/19/2019   Acquired thrombophilia ([place]) 01/19/2019   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   Postlaminectomy syndrome, lumbar region 01/16/2019   History of fusion of lumbar spine (L2-L5) 01/16/2019   Chronic radicular lumbar pain 01/16/2019   HNP (  herniated nucleus pulposus), lumbar 04/29/2018   Advanced care planning/counseling discussion 09/28/2016   Spinal stenosis, lumbar region, 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/13/2016   Hyperlipidemia associated with type 2 diabetes mellitus ([place]) 07/14/2015   Symptomatic anemia 06/30/2015   Benign prostatic hyperplasia without lower urinary tract symptoms 06/02/2015   OSA (obstructive sleep apnea) 03/23/2015   Hypertension associated with diabetes ([place]) 09/28/2014   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[place]) 09/28/2014   H/O prior ablation treatment 10/19/2011   AF (paroxysmal atrial fibrillation) ([place]) 10/19/2011    1. OSA (obstructive sleep apnea) The patient does tolerate PAP and reports  benefit from PAP use. The patient was reminded how to clean equipment and advised to replace supplies routinely. He is having more events lately and he is doing better with leak, he does admit he thinks he may be sleeping more on his back. We will try adjusting him to apap 5-15. . . The compliance is very good. The AHI is 10.4.   OSA on cpap- not controlled. CPAP continues to be medically necessary to treat this patient's OSA.  Will change to 5-15 cm apap, and do a 2 week  download. F/u 2 m.    2. CPAP use counseling CPAP Counseling: had a lengthy discussion with the patient regarding the importance of PAP therapy in management of the sleep apnea. Patient appears to understand the risk factor reduction and also understands the risks associated with untreated sleep apnea. Patient will try to make a good faith effort to remain compliant with therapy. Also instructed the patient on proper cleaning of the device including the water must be changed daily if possible and use of distilled water is preferred. Patient understands that the machine should be regularly cleaned with appropriate recommended cleaning solutions that do not damage the PAP machine for example given white vinegar and water rinses. Other methods such as ozone treatment may not be as good as these simple methods to achieve cleaning.     the evaluation and examination with Theodoro Doing.  I have also discussed any further diagnostic evaluation thatmay be needed or ordered today. Abubakr verbalizes understanding of the findings of todays visit. We also reviewed his medications today and discussed drug interactions and side effects including but not limited excessive drowsiness and altered mental states. We also discussed that there is always a risk not just to him but also people around him. he has been encouraged to call the office with any questions or concerns that should arise related to todays visit.  No orders of the defined types were placed in this encounter.       I have personally obtained a history, examined the patient, evaluated laboratory and imaging results, formulated the assessment and plan and placed orders. This patient was seen today by Tressie Ellis, PA-C in collaboration with Dr. Devona Konig.   Allyne Gee, MD [facility] Diplomate ABMS Pulmonary Critical Care Medicine and Sleep Medicine

## 2021-11-14 NOTE — Patient Instructions (Signed)

## 2021-11-15 ENCOUNTER — Encounter: Payer: Self-pay | Admitting: Gastroenterology

## 2021-11-15 ENCOUNTER — Ambulatory Visit (INDEPENDENT_AMBULATORY_CARE_PROVIDER_SITE_OTHER): Payer: Medicare Other | Admitting: Gastroenterology

## 2021-11-15 VITALS — BP 118/71 | HR 86 | Temp 98.1°F | Wt 185.4 lb

## 2021-11-15 DIAGNOSIS — I5030 Unspecified diastolic (congestive) heart failure: Secondary | ICD-10-CM | POA: Diagnosis not present

## 2021-11-15 DIAGNOSIS — I2510 Atherosclerotic heart disease of native coronary artery without angina pectoris: Secondary | ICD-10-CM

## 2021-11-15 DIAGNOSIS — R1310 Dysphagia, unspecified: Secondary | ICD-10-CM

## 2021-11-15 MED ORDER — OMEPRAZOLE 40 MG PO CPDR: 40.0000 mg | DELAYED_RELEASE_CAPSULE | Freq: Two times a day (BID) | ORAL | 3 refills | Status: DC

## 2021-11-15 NOTE — Progress Notes (Signed)
Jonathon Bellows MD, MRCP(U.K) [address]  [place]  [place], [place] [address]  Main: [phone]  Fax: [phone]   Primary Care Physician: [name], NP  Primary Gastroenterologist:  Dr. Jonathon Bellows   Chief Complaint  Patient presents with   Dysphagia    HPI: Sean Reyes. is a 81 y.o. male  Summary of history : Previously seen and evaluated for dysphagia secondary to a Schatzki's ring that was dilated, C. difficile diarrhea.  Iron deficiency anemia.  .He has previously been a patient of [facility] last seen at their office in July 2021 for diarrhea. Positive for Salmonella treated with antibiotics, esophagitis, GERD, dysphagia.  Last colonoscopy in 2017 showed diverticulosis multiple nonbleeding AVMs throughout the colon.  Last EGD in February 2020 by Dr. Gustavo Lah for dysphagia found to have a hiatal hernia, LA grade B esophagitis.  Unfortunately no biopsies were taken to rule out eosinophilic esophagitis.  On metformin.  Stool occult test was positive on 09/14/2020, iron studies are normal with a ferritin of 66 and normal TIBC.  Hemoglobin 11.1 g with an MCV of 93.  His creatinine is 1.38.  He is on Eliquis. History of esophagitis.  2022 treated for C. difficile diarrhea   11/01/2020: EGD: 4 Schatzki's ring seen at the GE junction dilated to 18 mm biopsies esophagus were taken medium size hiatal hernia was noted.  Colonoscopy performed on the same day.  Nonbleeding internal hemorrhoids noted.  Otherwise normal.  Biopsies esophagus showed no abnormalities. 10/04/2020: C. difficile toxin positive.  Commenced on vancomycin 4 times daily. 09/29/2020: B12 and folate normal.  Interval history   09/29/2020-11/10/2020  He states that despite having the dilation last year he still has difficulty swallowing metformin which was also issue when he saw me last year.  He does not have any issues swallowing solids or liquids or his meals otherwise.  He does  recollect that he has seen ENT he has had a modified barium swallow previously which showed no abnormalities.    Current Outpatient Medications  Medication Sig Dispense Refill   albuterol (VENTOLIN HFA) 108 (90 Base) MCG/ACT inhaler Inhale 2 puffs into the lungs every 6 (six) hours as needed for wheezing or shortness of breath. 8 g 2   clopidogrel (PLAVIX) 75 MG tablet Take 75 mg by mouth daily.     cyanocobalamin 1000 MCG tablet Take by mouth.     ELIQUIS 5 MG TABS tablet Take 1 tablet (5 mg total) by mouth 2 (two) times daily. 180 tablet 1   ferrous sulfate 325 (65 FE) MG EC tablet Take 325 mg by mouth daily with breakfast.     finasteride (PROSCAR) 5 MG tablet Take 1 tablet (5 mg total) by mouth daily. 90 tablet 2   isosorbide mononitrate (IMDUR) 30 MG 24 hr tablet Take 30 mg by mouth daily.     metFORMIN (GLUCOPHAGE) 500 MG tablet Take 1 tablet (500 mg total) by mouth 2 (two) times daily with a meal. 180 tablet 4   metoprolol succinate (TOPROL XL) 25 MG 24 hr tablet Take 1 tablet (25 mg total) by mouth daily. 30 tablet 5   omeprazole (PRILOSEC) 40 MG capsule Take 40 mg by mouth daily.     rosuvastatin (CRESTOR) 5 MG tablet Take 1 tablet (5 mg total) by mouth daily. 90 tablet 0   solifenacin (VESICARE) 5 MG tablet Take 5 mg by mouth daily.     tamsulosin (FLOMAX) 0.4 MG CAPS capsule  Take 0.8 mg by mouth daily.     umeclidinium-vilanterol (ANORO ELLIPTA) 62.5-25 MCG/ACT AEPB Inhale 1 puff into the lungs daily. 30 each 11   No current facility-administered medications for this visit.    Allergies as of 11/15/2021 - Review Complete 11/15/2021  Allergen Reaction Noted   Levaquin [levofloxacin in d5w] Anaphylaxis and Shortness Of Breath 07/27/2014   Shellfish allergy Anaphylaxis 08/26/2014   Amiodarone Other (See Comments) 04/19/2015   Adhesive [tape] Other (See Comments) 04/19/2018    ROS:  General: Negative for anorexia, weight loss, fever, chills, fatigue, weakness. ENT: Negative  for hoarseness, difficulty swallowing , nasal congestion. CV: Negative for chest pain, angina, palpitations, dyspnea on exertion, peripheral edema.  Respiratory: Negative for dyspnea at rest, dyspnea on exertion, cough, sputum, wheezing.  GI: See history of present illness. GU:  Negative for dysuria, hematuria, urinary incontinence, urinary frequency, nocturnal urination.  Endo: Negative for unusual weight change.    Physical Examination:   BP 118/71   Pulse 86   Temp 98.1 F (36.7 C) (Oral)   Wt 185 lb 6.4 oz (84.1 kg)   BMI 24.46 kg/m   General: Well-nourished, well-developed in no acute distress.  Eyes: No icterus. Conjunctivae pink. Neuro: Alert and oriented x 3.  Grossly intact. Skin: Warm and dry, no jaundice.   Psych: Alert and cooperative, normal mood and affect.   Imaging Studies: CT CHEST WO CONTRAST  Result Date: 11/13/2021 CLINICAL DATA:  Dyspnea, chronic, unclear etiology EXAM: CT CHEST WITHOUT CONTRAST TECHNIQUE: Multidetector CT imaging of the chest was performed following the standard protocol without IV contrast. RADIATION DOSE REDUCTION: This exam was performed according to the departmental dose-optimization program which includes automated exposure control, adjustment of the mA and/or kV according to patient size and/or use of iterative reconstruction technique. COMPARISON:  CT lumbar insert spine 06/23/2021, chest x-ray 08/05/2021 FINDINGS: Cardiovascular: Normal heart size. No significant pericardial effusion. Enlarged ascending thoracic aorta caliber measuring up to 4 cm. The descending thoracic aorta is normal in caliber. Moderate severe atherosclerotic plaque. Severe atherosclerotic plaque of the thoracic aorta. At least 3 vessel coronary artery calcifications. The main pulmonary artery measures at the upper limits of normal. Mediastinum/Nodes: No gross hilar adenopathy, noting limited sensitivity for the detection of hilar adenopathy on this noncontrast study. No  enlarged mediastinal or axillary lymph nodes. Thyroid gland, trachea, and esophagus demonstrate no significant findings. Lungs/Pleura: Azygous fissure noted. No focal consolidation. A triangular 4 mm subpleural pulmonary nodule along the left major fissure likely representing an intrapulmonary lymph node. Triangular subpleural 6 x 5 mm subpleural nodule along the right major fissure likely representing an intrapulmonary lymph node. No pulmonary mass. No pleural effusion. No pneumothorax. Upper Abdomen: No acute abnormality. Musculoskeletal: No chest wall abnormality. Neural stimulator leads entering at the T12-L1 level with tip terminating at in the posterior central canal at the C7-T10 levels. No suspicious lytic or blastic osseous lesions. No acute displaced fracture. Multilevel degenerative changes of the spine. IMPRESSION: 1. Aneurysmal ascending thoracic aorta (4 cm). Recommend annual imaging followup by CTA or MRA. This recommendation follows 2010 ACCF/AHA/AATS/ACR/ASA/SCA/SCAI/SIR/STS/SVM Guidelines for the Diagnosis and Management of Patients with Thoracic Aortic Disease. Circulation. 2010; 121: Z858-I502. Aortic aneurysm NOS (ICD10-I71.9). 2. Aortic Atherosclerosis (ICD10-I70.0) with at least 3 vessel coronary calcification. 3.  No acute intrathoracic abnormality. Electronically Signed   By: Iven Finn M.D.   On: 11/13/2021 21:00    Assessment and Plan:   Burdett Pinzon. is a 81 y.o. y/o male here to  see me here as a follow-up for dysphagia.  Previously had evaluation including a modified barium swallow, EGD, dilation of Schatzki's ring eosinophilic esophagitis ruled out.  At his last visit he had difficulty swallowing metformin which seems to persist at this point of time.  He does not have issues with swallowing anything else.  It is possible that his dysphagia is functional.  Plan 1.  Dysphagia : Increase his dose of his omeprazole 40 mg PPI to twice a day to see if it provided any benefit  new prescription be given for 90 days with 3 refills.  He will see me back in 3 months if no better we could consider further endoscopy and barium swallow to rule out achalasia  Dr Jonathon Bellows  MD,MRCP [facility]) Follow up in 3 months

## 2021-11-18 ENCOUNTER — Ambulatory Visit: Payer: Self-pay

## 2021-11-18 NOTE — Patient Outreach (Signed)
Care Coordination   Follow Up Visit Note   11/18/2021 Name: Sean Reyes. MRN: [id number] DOB: [birth year]  Sean Reyes. is a 81 y.o. year old male who sees [place], Henrine Screws T, NP for primary care. I spoke with  Sean Reyes. by phone today.  What matters to the patients health and wellness today?  Controlling pain,  continued improvement in his heart health, and new findings and dx of COPD   Goals Addressed             This Visit's Progress    RNCM: Effective Management of COPD       Care Coordination Interventions: Provided patient with basic written and verbal COPD education on self care/management/and exacerbation prevention Advised patient to track and manage COPD triggers Provided written and verbal instructions on pursed lip breathing and utilized returned demonstration as teach back Provided instruction about proper use of medications used for management of COPD including inhalers Advised patient to self assesses COPD action plan zone and make appointment with provider if in the yellow zone for 48 hours without improvement Advised patient to engage in light exercise as tolerated 3-5 days a week to aid in the the management of COPD Provided education about and advised patient to utilize infection prevention strategies to reduce risk of respiratory infection Discussed the importance of adequate rest and management of fatigue with COPD        RNCM: Effective Management of Heart Health       Care Coordination Interventions: BP Readings from Last 3 Encounters:  11/15/21 118/71  11/14/21 135/82  10/31/21 138/71    Provided education on importance of blood pressure control in management of CAD. The patient states he is feeling much better since seeing his new cardiologist and adjustments in medications and also getting his CPAP machine. The patient states that he over all is not as tired and can tell a positive difference Provided education on Importance of limiting  foods high in cholesterol.  Counseled on importance of regular laboratory monitoring as prescribed. 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 Limited in his ability to do exercises due to back pain and discomfort. Education on staying active and pacing activity. Patient is currently assisting his wife who recently had knee replacement surgery Reviewed Importance of taking all medications as prescribed. No new needs related to medications Reviewed Importance of attending all scheduled provider appointments. 01-02-2022 with pcp. Sees cardiologist on a regular basis Advised to report any changes in symptoms or exercise tolerance Advised patient to discuss changes in the way he feels, increase shortness of breath, questions or concerns with provider Screening for signs and symptoms of depression related to chronic disease state Assessed social determinant of health barriers AWV completed on 09-12-2021      RNCM: Effective Management of Pain       Care Coordination Interventions:   Reviewed provider established plan for pain management. The patient feels his pain in his back and legs are getting worse. He denies falls.  He states he continues to use his crutch when ambulating. Review of safety and fall risk. Discussed importance of adherence to all scheduled medical appointments. Sees pcp on 01-02-2022 Counseled on the importance of reporting any/all new or changed pain symptoms or management strategies to pain management provider Advised patient to report to care team affect of pain on daily activities Discussed use of relaxation techniques and/or diversional activities to assist with  pain reduction (distraction, imagery, relaxation, massage, acupressure, TENS, heat, and cold application Reviewed with patient prescribed pharmacological and nonpharmacological pain relief strategies. The patient is compliant with his medications and he also  has a nerve stimulator that he adjust accordingly. The patient has a representative that helps him with settings as needed.  Advised patient to discuss changes in level or intensity of pain or unresolved pain with provider Has a pain stimulator for back pain and discomfort. Can adjust settings as needed for pain relief           SDOH assessments and interventions completed:  Yes  SDOH Interventions Today    Flowsheet Row Most Recent Value  SDOH Interventions   Utilities Interventions Intervention Not Indicated        Care Coordination Interventions Activated:  Yes  Care Coordination Interventions:  Yes, provided   Follow up plan: Follow up call scheduled for 01-20-2022 at 3 pm    Encounter Outcome:  Pt. Visit Completed   Noreene Larsson RN, MSN, [place]  Mobile: [phone]

## 2021-11-18 NOTE — Patient Instructions (Addendum)
Visit Information  Thank you for taking time to visit with me today. Please don't hesitate to contact me if I can be of assistance to you.   Following are the goals we discussed today:   Goals Addressed             This Visit's Progress    RNCM: Effective Management of COPD       Care Coordination Interventions: Provided patient with basic written and verbal COPD education on self care/management/and exacerbation prevention Advised patient to track and manage COPD triggers Provided written and verbal instructions on pursed lip breathing and utilized returned demonstration as teach back Provided instruction about proper use of medications used for management of COPD including inhalers Advised patient to self assesses COPD action plan zone and make appointment with provider if in the yellow zone for 48 hours without improvement Advised patient to engage in light exercise as tolerated 3-5 days a week to aid in the the management of COPD Provided education about and advised patient to utilize infection prevention strategies to reduce risk of respiratory infection Discussed the importance of adequate rest and management of fatigue with COPD        RNCM: Effective Management of Heart Health       Care Coordination Interventions: BP Readings from Last 3 Encounters:  11/15/21 118/71  11/14/21 135/82  10/31/21 138/71    Provided education on importance of blood pressure control in management of CAD. The patient states he is feeling much better since seeing his new cardiologist and adjustments in medications and also getting his CPAP machine. The patient states that he over all is not as tired and can tell a positive difference Provided education on Importance of limiting foods high in cholesterol.  Counseled on importance of regular laboratory monitoring as prescribed. 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 Limited in his  ability to do exercises due to back pain and discomfort. Education on staying active and pacing activity. Patient is currently assisting his wife who recently had knee replacement surgery Reviewed Importance of taking all medications as prescribed. No new needs related to medications Reviewed Importance of attending all scheduled provider appointments. 01-02-2022 with pcp. Sees cardiologist on a regular basis Advised to report any changes in symptoms or exercise tolerance Advised patient to discuss changes in the way he feels, increase shortness of breath, questions or concerns with provider Screening for signs and symptoms of depression related to chronic disease state Assessed social determinant of health barriers AWV completed on 09-12-2021      RNCM: Effective Management of Pain       Care Coordination Interventions:   Reviewed provider established plan for pain management. The patient feels his pain in his back and legs are getting worse. He denies falls.  He states he continues to use his crutch when ambulating. Review of safety and fall risk. Discussed importance of adherence to all scheduled medical appointments. Sees pcp on 01-02-2022 Counseled on the importance of reporting any/all new or changed pain symptoms or management strategies to pain management provider Advised patient to report to care team affect of pain on daily activities Discussed use of relaxation techniques and/or diversional activities to assist with pain reduction (distraction, imagery, relaxation, massage, acupressure, TENS, heat, and cold application Reviewed with patient prescribed pharmacological and nonpharmacological pain relief strategies. The patient is compliant with his medications and he also has a nerve stimulator that he adjust accordingly.  The patient has a representative that helps him with settings as needed.  Advised patient to discuss changes in level or intensity of pain or unresolved pain with  provider Has a pain stimulator for back pain and discomfort. Can adjust settings as needed for pain relief           Our next appointment is by telephone on 01-20-2022 at 3 pm  Please call the care guide team at [phone] if you need to cancel or reschedule your appointment.   If you are experiencing a Mental Health or [place] or need someone to talk to, please call the Suicide and Crisis Lifeline: 988 call the [place] National Suicide Prevention Lifeline: 580-254-2712 or TTY: 640-540-8740 TTY 916-215-5373) to talk to a trained counselor call 1-800-273-TALK (toll free, 24 hour hotline)  Patient verbalizes understanding of instructions and care plan provided today and agrees to view in [place]. Active MyChart status and patient understanding of how to access instructions and care plan via MyChart confirmed with patient.     Telephone follow up appointment with care management team member scheduled for: 01-20-2022 at 3 pm  [place], MSN, [place]  Mobile: [phone]    Chronic Obstructive Pulmonary Disease  Chronic obstructive pulmonary disease (COPD) is a long-term (chronic) lung problem. When you have COPD, it is hard for air to get in and out of your lungs. Usually the condition gets worse over time, and your lungs will never return to normal. There are things you can do to keep yourself as healthy as possible. What are the causes? Smoking. This is the most common cause. Certain genes passed from parent to child (inherited). What increases the risk? Being exposed to secondhand smoke from cigarettes, pipes, or cigars. Being exposed to chemicals and other irritants, such as fumes and dust in the work environment. Having chronic lung conditions or infections. What are the signs or symptoms? Shortness of breath, especially during physical activity. A long-term cough with a large amount of thick mucus. Sometimes, the cough  may not have any mucus (dry cough). Wheezing. Breathing quickly. Skin that looks gray or blue, especially in the fingers, toes, or lips. Feeling tired (fatigue). Weight loss. Chest tightness. Having infections often. Episodes when breathing symptoms become much worse (exacerbations). At the later stages of this disease, you may have swelling in the ankles, feet, or legs. How is this treated? Taking medicines. Quitting smoking, if you smoke. Rehabilitation. This includes steps to make your body work better. It may involve a team of specialists. Doing exercises. Making changes to your diet. Using oxygen. Lung surgery. Lung transplant. Comfort measures (palliative care). Follow these instructions at home: Medicines Take over-the-counter and prescription medicines only as told by your doctor. Talk to your doctor before taking any cough or allergy medicines. You may need to avoid medicines that cause your lungs to be dry. Lifestyle If you smoke, stop smoking. Smoking makes the problem worse. Do not smoke or use any products that contain nicotine or tobacco. If you need help quitting, ask your doctor. Avoid being around things that make your breathing worse. This may include smoke, chemicals, and fumes. Stay active, but remember to rest as well. Learn and use tips on how to manage stress and control your breathing. Make sure you get enough sleep. Most adults need at least 7 hours of sleep every night. Eat healthy foods. Eat smaller meals more often. Rest before meals. Controlled breathing Learn and use tips  on how to control your breathing as told by your doctor. Try: Breathing in (inhaling) through your nose for 1 second. Then, pucker your lips and breath out (exhale) through your lips for 2 seconds. Putting one hand on your belly (abdomen). Breathe in slowly through your nose for 1 second. Your hand on your belly should move out. Pucker your lips and breathe out slowly through your  lips. Your hand on your belly should move in as you breathe out.  Controlled coughing Learn and use controlled coughing to clear mucus from your lungs. Follow these steps: Lean your head a little forward. Breathe in deeply. Try to hold your breath for 3 seconds. Keep your mouth slightly open while coughing 2 times. Spit any mucus out into a tissue. Rest and do the steps again 1 or 2 times as needed. General instructions Make sure you get all the shots (vaccines) that your doctor recommends. Ask your doctor about a flu shot and a pneumonia shot. Use oxygen therapy and pulmonary rehabilitation if told by your doctor. If you need home oxygen therapy, ask your doctor if you should buy a tool to measure your oxygen level (oximeter). Make a COPD action plan with your doctor. This helps you to know what to do if you feel worse than usual. Manage any other conditions you have as told by your doctor. Avoid going outside when it is very hot, cold, or humid. Avoid people who have a sickness you can catch (contagious). Keep all follow-up visits. Contact a doctor if: You cough up more mucus than usual. There is a change in the color or thickness of the mucus. It is harder to breathe than usual. Your breathing is faster than usual. You have trouble sleeping. You need to use your medicines more often than usual. You have trouble doing your normal activities such as getting dressed or walking around the house. Get help right away if: You have shortness of breath while resting. You have shortness of breath that stops you from: Being able to talk. Doing normal activities. Your chest hurts for longer than 5 minutes. Your skin color is more blue than usual. Your pulse oximeter shows that you have low oxygen for longer than 5 minutes. You have a fever. You feel too tired to breathe normally. These symptoms may represent a serious problem that is an emergency. Do not wait to see if the symptoms will go  away. Get medical help right away. Call your local emergency services (911 in the [place] Do not drive yourself to the hospital. Summary Chronic obstructive pulmonary disease (COPD) is a long-term lung problem. The way your lungs work will never return to normal. Usually the condition gets worse over time. There are things you can do to keep yourself as healthy as possible. Take over-the-counter and prescription medicines only as told by your doctor. If you smoke, stop. Smoking makes the problem worse. This information is not intended to replace advice given to you by your health care provider. Make sure you discuss any questions you have with your health care provider. Document Revised: 12/09/2019 Document Reviewed: 12/09/2019 Elsevier Patient Education  [employer].

## 2021-11-28 ENCOUNTER — Encounter: Payer: Self-pay | Admitting: Student in an Organized Health Care Education/Training Program

## 2021-11-28 ENCOUNTER — Ambulatory Visit (INDEPENDENT_AMBULATORY_CARE_PROVIDER_SITE_OTHER): Payer: Medicare Other | Admitting: Student in an Organized Health Care Education/Training Program

## 2021-11-28 VITALS — BP 122/82 | HR 67 | Temp 97.6°F | Ht 73.0 in | Wt 186.2 lb

## 2021-11-28 DIAGNOSIS — J449 Chronic obstructive pulmonary disease, unspecified: Secondary | ICD-10-CM

## 2021-11-28 DIAGNOSIS — R0602 Shortness of breath: Secondary | ICD-10-CM | POA: Diagnosis not present

## 2021-11-28 NOTE — Progress Notes (Signed)
Synopsis: Follow up for shortness of breath.  Assessment & Plan:   #Shortness of breath on exertion #COPD GOLD 2E   Patient presenting with shortness of breath with PFT's performed prior to his last visit showing obstruction with a reduced FEV1/FVC ratio and mild drop in FEV1. 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 Bronchodilators during spirometry had helped with his symptoms but he was hesitant to start LABA/LAMA and did not initiate the Anoro. PFT's also show a drop in his TLC as well as DLCO - follow up chest CT performed did not show any signs of restrictive or interstitial lung disease. His lung exam remains re-assuring with no rales, rhonchi, stridor, or wheeze.  Given concerns for possible side effects from the Anoro, Mr. Coomes did not start his Anoro Ellipta. He is comfortable with the symptoms as is and would let us know once he feels the benefit from inhaler therapy outweighs the risks for him.  Return in about 6 months (around 05/30/2022).  I spent 25 minutes caring for this patient today, including preparing to see the patient, obtaining and/or reviewing separately obtained history, performing a medically appropriate examination and/or evaluation, counseling and educating the patient/family/caregiver, ordering medications, tests, or procedures, and documenting clinical information in the electronic health record  Armando Reichert, MD [facility] 11/28/2021 2:19 PM    End of visit medications:  No orders of the defined types were placed in this encounter.    Current Outpatient Medications:    clopidogrel (PLAVIX) 75 MG tablet, Take 75 mg by mouth daily., Disp: , Rfl:    cyanocobalamin 1000 MCG tablet, Take by mouth., Disp: , Rfl:    ELIQUIS 5 MG TABS tablet, Take 1 tablet (5 mg total) by mouth 2 (two) times daily., Disp: 180 tablet, Rfl: 1   ferrous sulfate 325 (65 FE) MG EC tablet,  Take 325 mg by mouth daily with breakfast., Disp: , Rfl:    finasteride (PROSCAR) 5 MG tablet, Take 1 tablet (5 mg total) by mouth daily., Disp: 90 tablet, Rfl: 2   isosorbide mononitrate (IMDUR) 30 MG 24 hr tablet, Take 30 mg by mouth daily., Disp: , Rfl:    metFORMIN (GLUCOPHAGE) 500 MG tablet, Take 1 tablet (500 mg total) by mouth 2 (two) times daily with a meal., Disp: 180 tablet, Rfl: 4   metoprolol succinate (TOPROL XL) 25 MG 24 hr tablet, Take 1 tablet (25 mg total) by mouth daily., Disp: 30 tablet, Rfl: 5   omeprazole (PRILOSEC) 40 MG capsule, Take 1 capsule (40 mg total) by mouth in the morning and at bedtime., Disp: 180 capsule, Rfl: 3   rosuvastatin (CRESTOR) 5 MG tablet, Take 1 tablet (5 mg total) by mouth daily., Disp: 90 tablet, Rfl: 0   solifenacin (VESICARE) 5 MG tablet, Take 5 mg by mouth daily., Disp: , Rfl:    tamsulosin (FLOMAX) 0.4 MG CAPS capsule, Take 0.8 mg by mouth daily., Disp: , Rfl:    albuterol (VENTOLIN HFA) 108 (90 Base) MCG/ACT inhaler, Inhale 2 puffs into the lungs every 6 (six) hours as needed for wheezing or shortness of breath. (Patient not taking: Reported on 11/28/2021), Disp: 8 g, Rfl: 2   umeclidinium-vilanterol (ANORO ELLIPTA) 62.5-25 MCG/ACT AEPB, Inhale 1 puff into the lungs daily. (Patient not taking: Reported on 11/28/2021), Disp: 30 each, Rfl: 11   Subjective:   PATIENT ID: Sean Reyes. GENDER: male DOB: December 22, 1940, MRN:  [id number]  Chief Complaint  Patient presents with   Follow-up    COPD. Constant SOB. No wheezing. Dry cough.     HPI  Mr. Waylyn is a pleasant 81 year old male presenting for follow up on shortness of breath. I last saw him 10/27/2021  He reports that his symptoms mostly started after suffering a heart attack. He has been seen by Dr. Fletcher Anon at the [facility] center and referred to pulmonary for further workup of his shortness of breath.  He tells me that his shortness of breath is mostly with exertion, and sometimes at rest.  He has an occasional dry cough but no sputum production. He has significant lower back and lower extremity pain, and has had a pain stimulator placed to control his symptoms. He reports that it is limiting to his activities of daily living and he has not been able to do the things he wants to do. He doesn't have any new symptoms. During our last visit, I prescribed a LAMA/LABA combination therapy that he was hesitant to take after reading the side effects profile. He felt the risk/benefit balance did not favor the initiation of the medication and he is comfortable with the symptoms as is.   In addition to his CAD (RCA stent, 60% distal LAD), he's had an ablation procedure for Afib twice, with improvement. He was maintained on propafenone and metoprolol following the procedure. He will have a cardiac monitor placed and will follow up for that. He is concerned the Anoro could trigger his afib.   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 His father was also a Building control surveyor and he used to help him growing up. Denies any exposure to asbestos.  Ancillary information including prior medications, full medical/surgical/family/social histories, and PFTs (when available) are listed below and have been reviewed.   Review of Systems  Constitutional:  Negative for chills and fever.  Respiratory:  Positive for shortness of breath. Negative for cough, sputum production and wheezing.   Cardiovascular:  Positive for palpitations. Negative for chest pain.     Objective:   Vitals:   11/28/21 1352  BP: 122/82  Pulse: 67  Temp: 97.6 F (36.4 C)  SpO2: 96%  Weight: 186 lb 3.2 oz (84.5 kg)  Height: '6\' 1"'$  (1.854 m)   96% on RA BMI Readings from Last 3 Encounters:  11/28/21 24.57 kg/m  11/15/21 24.46 kg/m  11/14/21 24.54 kg/m   Wt Readings from Last 3 Encounters:  11/28/21  186 lb 3.2 oz (84.5 kg)  11/15/21 185 lb 6.4 oz (84.1 kg)  11/14/21 186 lb (84.4 kg)    Physical Exam Constitutional:      General: He is not in acute distress.    Appearance: Normal appearance. He is not ill-appearing.  HENT:     Mouth/Throat:     Mouth: Mucous membranes are moist.  Eyes:     Pupils: Pupils are equal, round, and reactive to light.  Cardiovascular:     Rate and Rhythm: Normal rate. Rhythm irregular.     Pulses: Normal pulses.     Heart sounds: Normal heart sounds.  Pulmonary:     Effort: Pulmonary effort is normal.     Breath sounds: Normal breath sounds. No wheezing.  Abdominal:     General: Abdomen is flat.     Palpations: Abdomen is soft.  Musculoskeletal:  Cervical back: Normal range of motion.     Right lower leg: No edema.     Left lower leg: No edema.  Neurological:     General: No focal deficit present.     Mental Status: He is alert and oriented to person, place, and time.       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   Hyperlipidemia    Hypertension    Myocardial infarction ([place]) 12/2020   Nocturia    S/P ablation of atrial fibrillation    Ablative therapy   Sleep apnea    CPAP    Spinal stenosis    Tachycardia, unspecified      Family History  Problem Relation Age of Onset   Brain cancer Mother    Kidney disease Neg Hx    Prostate cancer Neg Hx    Kidney cancer Neg Hx    Bladder Cancer Neg Hx      Past Surgical History:  Procedure Laterality  Date   ABLATION     ANTERIOR LAT LUMBAR FUSION N/A 06/27/2017   Procedure: Anterior Lateral Lumbar Interbody  Fusion - Lumbar Two-Lumbar Three - Lumbar Three-Lumbar Four, Posterior Lumbar Interbody Fusion Lumbar Four- Five;  Surgeon: Kary Kos, MD;  Location: [facility];  Service: Neurosurgery;  Laterality: N/A;  Anterior Lateral Lumbar Interbody  Fusion - Lumbar Two-Lumbar Three - Lumbar Three-Lumbar Four, Posterior Lumbar Interbody Fusion Lumbar Four- Five   BACK SURGERY     CARDIAC CATHETERIZATION     CARDIOVERSION N/A 08/29/2018   Procedure: CARDIOVERSION;  Surgeon: Corey Skains, MD;  Location: [facility];  Service: Cardiovascular;  Laterality: N/A;   CARDIOVERSION N/A 09/24/2018   Procedure: CARDIOVERSION;  Surgeon: Corey Skains, MD;  Location: [facility];  Service: Cardiovascular;  Laterality: N/A;   COLONOSCOPY WITH PROPOFOL N/A 10/05/2015   Procedure: COLONOSCOPY WITH PROPOFOL;  Surgeon: Lollie Sails, MD;  Location: [facility] ENDOSCOPY;  Service: Endoscopy;  Laterality: N/A;   COLONOSCOPY WITH PROPOFOL N/A 11/01/2020   Procedure: COLONOSCOPY WITH PROPOFOL;  Surgeon: Jonathon Bellows, MD;  Location: [facility] ENDOSCOPY;  Service: Gastroenterology;  Laterality: N/A;   CORONARY STENT INTERVENTION N/A 12/22/2020   Procedure: CORONARY STENT INTERVENTION;  Surgeon: [name], MD;  Location: [facility];  Service: Cardiovascular;  Laterality: N/A;   ESOPHAGOGASTRODUODENOSCOPY N/A 11/01/2020   Procedure: ESOPHAGOGASTRODUODENOSCOPY (EGD);  Surgeon: Jonathon Bellows, MD;  Location: [facility] ENDOSCOPY;  Service: Gastroenterology;  Laterality: N/A;   ESOPHAGOGASTRODUODENOSCOPY (EGD) WITH PROPOFOL N/A 04/01/2018   Procedure: ESOPHAGOGASTRODUODENOSCOPY (EGD) WITH PROPOFOL;  Surgeon: Lollie Sails, MD;  Location: [facility] ENDOSCOPY;  Service: Endoscopy;  Laterality: N/A;   HERNIA REPAIR     JOINT REPLACEMENT Bilateral    hips  RT+  LEFT X2    LEFT HEART CATH AND CORONARY ANGIOGRAPHY N/A  12/22/2020   Procedure: LEFT HEART CATH  AND CORONARY ANGIOGRAPHY;  Surgeon: [name], MD;  Location: [place] [facility];  Service: Cardiovascular;  Laterality: N/A;   LEFT HEART CATH AND CORONARY ANGIOGRAPHY Left 08/23/2021   Procedure: LEFT HEART CATH AND CORONARY ANGIOGRAPHY;  Surgeon: Corey Skains, MD;  Location: [place] [facility];  Service: Cardiovascular;  Laterality: Left;   LUMBAR LAMINECTOMY/DECOMPRESSION MICRODISCECTOMY Left 09/13/2016   Procedure: Microdiscectomy - Lumbar two-three,  Lumbar three- - left;  Surgeon: Kary Kos, MD;  Location: [place];  Service: Neurosurgery;  Laterality: Left;   SPINAL CORD STIMULATOR INSERTION  07/08/2019   TONSILLECTOMY      Social History   Socioeconomic History   Marital status: Married    Spouse name: Malachy Mood    Number of children: 2   Years of education: Not on file   Highest education level: High school graduate  Occupational History   Occupation: retired   Tobacco Use   Smoking status: Never   Smokeless tobacco: Never  Vaping Use   Vaping Use: Never used  Substance and Sexual Activity   Alcohol use: No   Drug use: No   Sexual activity: Not on file  Other Topics Concern   Not on file  Social History Narrative   MarriedGets regular exercise.      Lives in [place] with wife/ daughter. Never smoked; no alcohol. 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    Social Determinants of Health   Financial Resource Strain: Low Risk  (09/12/2021)   Overall Financial Resource Strain (CARDIA)    Difficulty of Paying Living Expenses: Not hard at all  Food Insecurity: No Food Insecurity (09/12/2021)   Hunger Vital Sign    Worried About Running Out of Food in the Last Year: Never true    Ran Out of Food in the Last Year: Never true  Transportation Needs: No Transportation Needs (09/12/2021)   PRAPARE - Hydrologist (Medical): No    Lack of Transportation  (Non-Medical): No  Physical Activity: Inactive (09/23/2021)   Exercise Vital Sign    Days of Exercise per Week: 0 days    Minutes of Exercise per Session: 0 min  Stress: No Stress Concern Present (09/12/2021)   [place]    Feeling of Stress : Only a little  Social Connections: Moderately Integrated (09/12/2021)   Social Connection and Isolation Panel [NHANES]    Frequency of Communication with Friends and Family: Three times a week    Frequency of Social Gatherings with Friends and Family: Twice a week    Attends Religious Services: More than 4 times per year    Active Member of [employer] or Organizations: No    Attends Archivist Meetings: Never    Marital Status: Married  Human resources officer Violence: Not At Risk (09/12/2021)   Humiliation, Afraid, Rape, and Kick questionnaire    Fear of Current or Ex-Partner: No    Emotionally Abused: No    Physically Abused: No    Sexually Abused: No     Allergies  Allergen Reactions   Levaquin [Levofloxacin In D5w] Anaphylaxis and Shortness Of Breath   Shellfish Allergy Anaphylaxis    Has used duraprep, betadine and ioban in previous surgeries in 2019 and 2018 without issue   Amiodarone Other (See Comments)    Tremors and thyroid toxicity   Adhesive [Tape] Other (See Comments)    Little red bumps under the dressing.  He questions  whether is latex related     CBC    Component Value Date/Time   WBC 5.5 10/07/2021 1408   RBC 3.72 (L) 10/07/2021 1408   HGB 11.5 (L) 10/07/2021 1408   HGB 11.6 (L) 09/05/2021 1529   HCT 33.9 (L) 10/07/2021 1408   HCT 34.0 (L) 09/05/2021 1529   PLT 175 10/07/2021 1408   PLT 198 09/05/2021 1529   MCV 91.1 10/07/2021 1408   MCV 90 09/05/2021 1529   MCH 30.9 10/07/2021 1408   MCHC 33.9 10/07/2021 1408   RDW 13.1 10/07/2021 1408   RDW 13.0 09/05/2021 1529   LYMPHSABS 1.7 10/07/2021 1408   LYMPHSABS 2.0 09/05/2021 1529   MONOABS 0.6  10/07/2021 1408   EOSABS 0.0 10/07/2021 1408   EOSABS 0.1 09/05/2021 1529   BASOSABS 0.0 10/07/2021 1408   BASOSABS 0.0 09/05/2021 1529    Pulmonary Functions Testing Results:    Latest Ref Rng & Units 10/26/2021    2:56 PM  PFT Results  FVC-Pre L 4.03   FVC-Predicted Pre % 87   FVC-Post L 4.03   FVC-Predicted Post % 87   Pre FEV1/FVC % % 58   Post FEV1/FCV % % 63   FEV1-Pre L 2.34   FEV1-Predicted Pre % 70   FEV1-Post L 2.54   DLCO uncorrected ml/min/mmHg 19.67   DLCO UNC% % 73   DLVA Predicted % 80   TLC L 6.11   TLC % Predicted % 79   RV % Predicted % 75     Outpatient Medications Prior to Visit  Medication Sig Dispense Refill   clopidogrel (PLAVIX) 75 MG tablet Take 75 mg by mouth daily.     cyanocobalamin 1000 MCG tablet Take by mouth.     ELIQUIS 5 MG TABS tablet Take 1 tablet (5 mg total) by mouth 2 (two) times daily. 180 tablet 1   ferrous sulfate 325 (65 FE) MG EC tablet Take 325 mg by mouth daily with breakfast.     finasteride (PROSCAR) 5 MG tablet Take 1 tablet (5 mg total) by mouth daily. 90 tablet 2   isosorbide mononitrate (IMDUR) 30 MG 24 hr tablet Take 30 mg by mouth daily.     metFORMIN (GLUCOPHAGE) 500 MG tablet Take 1 tablet (500 mg total) by mouth 2 (two) times daily with a meal. 180 tablet 4   metoprolol succinate (TOPROL XL) 25 MG 24 hr tablet Take 1 tablet (25 mg total) by mouth daily. 30 tablet 5   omeprazole (PRILOSEC) 40 MG capsule Take 1 capsule (40 mg total) by mouth in the morning and at bedtime. 180 capsule 3   rosuvastatin (CRESTOR) 5 MG tablet Take 1 tablet (5 mg total) by mouth daily. 90 tablet 0   solifenacin (VESICARE) 5 MG tablet Take 5 mg by mouth daily.     tamsulosin (FLOMAX) 0.4 MG CAPS capsule Take 0.8 mg by mouth daily.     albuterol (VENTOLIN HFA) 108 (90 Base) MCG/ACT inhaler Inhale 2 puffs into the lungs every 6 (six) hours as needed for wheezing or shortness of breath. (Patient not taking: Reported on 11/28/2021) 8 g 2    umeclidinium-vilanterol (ANORO ELLIPTA) 62.5-25 MCG/ACT AEPB Inhale 1 puff into the lungs daily. (Patient not taking: Reported on 11/28/2021) 30 each 11   No facility-administered medications prior to visit.

## 2021-12-06 ENCOUNTER — Encounter: Payer: Self-pay | Admitting: Cardiology

## 2021-12-06 ENCOUNTER — Ambulatory Visit: Payer: Medicare Other | Attending: Cardiology | Admitting: Cardiology

## 2021-12-06 VITALS — BP 148/84 | HR 66 | Ht 73.0 in | Wt 187.0 lb

## 2021-12-06 DIAGNOSIS — I2510 Atherosclerotic heart disease of native coronary artery without angina pectoris: Secondary | ICD-10-CM | POA: Diagnosis not present

## 2021-12-06 DIAGNOSIS — E785 Hyperlipidemia, unspecified: Secondary | ICD-10-CM | POA: Diagnosis not present

## 2021-12-06 DIAGNOSIS — R0609 Other forms of dyspnea: Secondary | ICD-10-CM | POA: Diagnosis not present

## 2021-12-06 DIAGNOSIS — I10 Essential (primary) hypertension: Secondary | ICD-10-CM | POA: Diagnosis not present

## 2021-12-06 DIAGNOSIS — I480 Paroxysmal atrial fibrillation: Secondary | ICD-10-CM | POA: Insufficient documentation

## 2021-12-06 DIAGNOSIS — R0602 Shortness of breath: Secondary | ICD-10-CM | POA: Insufficient documentation

## 2021-12-06 MED ORDER — BENAZEPRIL HCL 20 MG PO TABS
20.0000 mg | ORAL_TABLET | Freq: Every day | ORAL | 3 refills | Status: DC
Start: 2021-12-06 — End: 2022-09-11

## 2021-12-06 NOTE — Progress Notes (Signed)
Cardiology Clinic Note   Patient Name: Sean Reyes. Date of Encounter: 12/06/2021  Primary Care Provider:  [name], NP Primary Cardiologist:  Kathlyn Sacramento, MD  Patient Profile    81 year old male with a past medical history paroxysmal atrial fibrillation, coronary disease, abnormal EKG with PVCs, essential hypertension, hyperlipidemia, type 2 diabetes, chronic kidney disease who is here today to follow-up on his paroxysmal atrial fibrillation.  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   Hyperlipidemia    Hypertension    Myocardial infarction ([place]) 12/2020   Nocturia    S/P ablation of atrial fibrillation    Ablative therapy   Sleep apnea    CPAP    Spinal stenosis    Tachycardia, unspecified    Past Surgical History:  Procedure Laterality Date   ABLATION     ANTERIOR LAT LUMBAR FUSION N/A 06/27/2017   Procedure: Anterior Lateral Lumbar Interbody  Fusion - Lumbar Two-Lumbar Three - Lumbar Three-Lumbar Four, Posterior Lumbar Interbody Fusion Lumbar Four- Five;  Surgeon: Kary Kos, MD;  Location: [place];  Service: Neurosurgery;  Laterality: N/A;  Anterior Lateral Lumbar Interbody  Fusion - Lumbar Two-Lumbar Three - Lumbar Three-Lumbar Four, Posterior Lumbar Interbody Fusion Lumbar Four- Five   BACK SURGERY     CARDIAC CATHETERIZATION     CARDIOVERSION N/A 08/29/2018   Procedure: CARDIOVERSION;  Surgeon: Corey Skains, MD;  Location: [place] [facility];  Service: Cardiovascular;   Laterality: N/A;   CARDIOVERSION N/A 09/24/2018   Procedure: CARDIOVERSION;  Surgeon: Corey Skains, MD;  Location: [facility];  Service: Cardiovascular;  Laterality: N/A;   COLONOSCOPY WITH PROPOFOL N/A 10/05/2015   Procedure: COLONOSCOPY WITH PROPOFOL;  Surgeon: Lollie Sails, MD;  Location: [facility] ENDOSCOPY;  Service: Endoscopy;  Laterality: N/A;   COLONOSCOPY WITH PROPOFOL N/A 11/01/2020   Procedure: COLONOSCOPY WITH PROPOFOL;  Surgeon: Jonathon Bellows, MD;  Location: [facility] ENDOSCOPY;  Service: Gastroenterology;  Laterality: N/A;   CORONARY STENT INTERVENTION N/A 12/22/2020   Procedure: CORONARY STENT INTERVENTION;  Surgeon: [name], MD;  Location: [place] [facility];  Service: Cardiovascular;  Laterality: N/A;   ESOPHAGOGASTRODUODENOSCOPY N/A 11/01/2020   Procedure: ESOPHAGOGASTRODUODENOSCOPY (EGD);  Surgeon: Jonathon Bellows, MD;  Location: [facility] ENDOSCOPY;  Service: Gastroenterology;  Laterality: N/A;   ESOPHAGOGASTRODUODENOSCOPY (EGD) WITH PROPOFOL N/A 04/01/2018   Procedure: ESOPHAGOGASTRODUODENOSCOPY (EGD) WITH PROPOFOL;  Surgeon: Lollie Sails, MD;  Location: [facility] ENDOSCOPY;  Service: Endoscopy;  Laterality: N/A;   HERNIA REPAIR     JOINT REPLACEMENT Bilateral    hips  RT+  LEFT X2    LEFT HEART CATH AND CORONARY ANGIOGRAPHY N/A 12/22/2020   Procedure: LEFT HEART CATH AND CORONARY ANGIOGRAPHY;  Surgeon: [name], MD;  Location: [place] [facility];  Service: Cardiovascular;  Laterality: N/A;   LEFT HEART CATH AND CORONARY ANGIOGRAPHY Left 08/23/2021  Procedure: LEFT HEART CATH AND CORONARY ANGIOGRAPHY;  Surgeon: Corey Skains, MD;  Location: [place] [facility];  Service: Cardiovascular;  Laterality: Left;   LUMBAR LAMINECTOMY/DECOMPRESSION MICRODISCECTOMY Left 09/13/2016   Procedure: Microdiscectomy - Lumbar two-three,  Lumbar three- - left;  Surgeon: Kary Kos, MD;  Location: [place];  Service: Neurosurgery;  Laterality: Left;   SPINAL CORD STIMULATOR  INSERTION  07/08/2019   TONSILLECTOMY      Allergies  Allergies  Allergen Reactions   Levaquin [Levofloxacin In D5w] Anaphylaxis and Shortness Of Breath   Shellfish Allergy Anaphylaxis    Has used duraprep, betadine and ioban in previous surgeries in 2019 and 2018 without issue   Amiodarone Other (See Comments)    Tremors and thyroid toxicity   Adhesive [Tape] Other (See Comments)    Little red bumps under the dressing.  He questions whether is latex related    History of Present Illness    Sean Reyes. is a 81 year old male with a previous mentioned past medical history of paroxysmal atrial fibrillation on chronic anticoagulation with apixaban, coronary artery disease with PVCs, essential hypertension, hyperlipidemia, type 2 diabetes, and chronic kidney disease.  He presented to [facility] in November 2022 with a non-ST elevated myocardial infarction.  Cardiac catheterization was done and showed severe proximal RCA stenosis which was treated successfully with PCI/DES.  He had repeat cardiac catheterization recently that showed patent RCA stent with no significant restenosis.  There was borderline 60% stenosis in the distal LAD and mid distal left circumflex disease.  He also underwent ablation procedure [facility] many years ago and reports having complication during the procedure that was necessitated aborting the procedure.  He subsequently had ablation done at [facility] by Dr. Marcello Moores with improvement.  He was placed on propafenone, which was changed to metoprolol after he had his myocardial infarction earlier this year due to bradycardia was discontinued due to progressively lowering heart rates.  Echocardiogram done 09/07/2021 revealed LVEF of 60-65%, no regional wall motion abnormalities, G1 DD, trivial mitral valve regurgitation.  He also wore a ZIO monitor which revealed heart rate of 46-1 67 the average heart rate of 64.  Predominant underlying rhythm was sinus rhythm with a  first-degree AV block.  There was 1 run of ventricular tachycardia that occurred lasting 7 beats and a total of 96 supraventricular tachycardia runs that occurred with the fastest interval lasting 5 beats with a maximum rate of 167.  He was last seen in clinic on 10/02/2021 at that time was continued to have exertional dyspnea and thought to be a component of physical deconditioning and he was thus referred to pulmonary.  There were no changes made to his medications and no further outpatient testing was ordered.  He returns to clinic today with continued exertional dyspnea. He did follow up with pulmonary who prescribed inhalers that he was hesitate to start and still has not. Was diagnosed with COPD Gold 2E. PFTs were completed and chest CT which did not reveal any obstructive or restrictive disease. He has noted an increased blood pressure primarily in the evening and has been taking benazepril 40 mg had at home when his blood pressure was greater than 150/90.    Home Medications    Current Outpatient Medications  Medication Sig Dispense Refill   benazepril (LOTENSIN) 20 MG tablet Take 1 tablet (20 mg total) by mouth daily. 90 tablet 3   clopidogrel (PLAVIX) 75 MG tablet Take 75 mg by mouth daily.  cyanocobalamin 1000 MCG tablet Take by mouth.     ELIQUIS 5 MG TABS tablet Take 1 tablet (5 mg total) by mouth 2 (two) times daily. 180 tablet 1   ferrous sulfate 325 (65 FE) MG EC tablet Take 325 mg by mouth daily with breakfast.     finasteride (PROSCAR) 5 MG tablet Take 1 tablet (5 mg total) by mouth daily. 90 tablet 2   isosorbide mononitrate (IMDUR) 30 MG 24 hr tablet Take 30 mg by mouth daily.     metFORMIN (GLUCOPHAGE) 500 MG tablet Take 1 tablet (500 mg total) by mouth 2 (two) times daily with a meal. 180 tablet 4   metoprolol succinate (TOPROL XL) 25 MG 24 hr tablet Take 1 tablet (25 mg total) by mouth daily. 30 tablet 5   omeprazole (PRILOSEC) 40 MG capsule Take 1 capsule (40 mg total)  by mouth in the morning and at bedtime. 180 capsule 3   rosuvastatin (CRESTOR) 5 MG tablet Take 1 tablet (5 mg total) by mouth daily. 90 tablet 0   solifenacin (VESICARE) 5 MG tablet Take 5 mg by mouth daily.     tamsulosin (FLOMAX) 0.4 MG CAPS capsule Take 0.8 mg by mouth daily.     albuterol (VENTOLIN HFA) 108 (90 Base) MCG/ACT inhaler Inhale 2 puffs into the lungs every 6 (six) hours as needed for wheezing or shortness of breath. (Patient not taking: Reported on 11/28/2021) 8 g 2   umeclidinium-vilanterol (ANORO ELLIPTA) 62.5-25 MCG/ACT AEPB Inhale 1 puff into the lungs daily. (Patient not taking: Reported on 11/28/2021) 30 each 11   No current facility-administered medications for this visit.     Family History    Family History  Problem Relation Age of Onset   Brain cancer Mother    Kidney disease Neg Hx    Prostate cancer Neg Hx    Kidney cancer Neg Hx    Bladder Cancer Neg Hx    He indicated that his mother is deceased. He indicated that his father is deceased. He indicated that the status of his neg hx is unknown.  Social History    Social History   Socioeconomic History   Marital status: Married    Spouse name: Malachy Mood    Number of children: 2   Years of education: Not on file   Highest education level: High school graduate  Occupational History   Occupation: retired   Tobacco Use   Smoking status: Never   Smokeless tobacco: Never  Vaping Use   Vaping Use: Never used  Substance and Sexual Activity   Alcohol use: No   Drug use: No   Sexual activity: Not on file  Other Topics Concern   Not on file  Social History Narrative   MarriedGets regular exercise.      Lives in [place] with wife/ daughter. Never smoked; no alcohol. 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    Social Determinants of Health   Financial Resource Strain: Low Risk  (09/12/2021)   Overall Financial Resource Strain (CARDIA)    Difficulty of Paying  Living Expenses: Not hard at all  Food Insecurity: No Food Insecurity (09/12/2021)   Hunger Vital Sign    Worried About Running Out of Food in the Last Year: Never true    Ran Out of Food in the Last Year: Never true  Transportation Needs: No Transportation Needs (09/12/2021)   PRAPARE - Hydrologist (Medical): No    Lack  of Transportation (Non-Medical): No  Physical Activity: Inactive (09/23/2021)   Exercise Vital Sign    Days of Exercise per Week: 0 days    Minutes of Exercise per Session: 0 min  Stress: No Stress Concern Present (09/12/2021)   [place]    Feeling of Stress : Only a little  Social Connections: Moderately Integrated (09/12/2021)   Social Connection and Isolation Panel [NHANES]    Frequency of Communication with Friends and Family: Three times a week    Frequency of Social Gatherings with Friends and Family: Twice a week    Attends Religious Services: More than 4 times per year    Active Member of [employer] or Organizations: No    Attends Archivist Meetings: Never    Marital Status: Married  Human resources officer Violence: Not At Risk (09/12/2021)   Humiliation, Afraid, Rape, and Kick questionnaire    Fear of Current or Ex-Partner: No    Emotionally Abused: No    Physically Abused: No    Sexually Abused: No     Review of Systems    General:  No chills, fever, night sweats or weight changes. Endorses fatigue. Cardiovascular:  No chest pain, endorses dyspnea on exertion, edema, orthopnea, palpitations, paroxysmal nocturnal dyspnea. Dermatological: No rash, lesions/masses Respiratory: No cough, endorses dyspnea Urologic: No hematuria, dysuria MUSCULOSKELETAL: Endorses right shin pain that is present for the past 35-40 years Abdominal:   No nausea, vomiting, diarrhea, bright red blood per rectum, melena, or hematemesis Neurologic:  No visual changes, wkns, changes in  mental status. All other systems reviewed and are otherwise negative except as noted above.   Physical Exam    VS:  BP (!) 148/84 (BP Location: Left Arm, Patient Position: Sitting, Cuff Size: Normal)   Pulse 66   Ht '6\' 1"'$  (1.854 m)   Wt 187 lb (84.8 kg)   SpO2 98%   BMI 24.67 kg/m  , BMI Body mass index is 24.67 kg/m.     Vitals:   12/06/21 1125 12/06/21 1200  BP: (!) 158/84 (!) 148/84    GEN: Well nourished, well developed, in no acute distress. HEENT: normal. Glasses on. Neck: Supple, no JVD, carotid bruits, or masses. Cardiac: RRR, no murmurs, rubs, or gallops. No clubbing, cyanosis, edema.  Radials/DP/PT 2+ and equal bilaterally.  Respiratory:  Respirations regular and unlabored, clear to auscultation bilaterally. GI: Soft, nontender, nondistended, BS + x 4. MS: no deformity or atrophy. Skin: warm and dry, no rash. Neuro:  Strength and sensation are intact. Psych: Normal affect.  Accessory Clinical Findings    ECG personally reviewed by me today-sinus rhythm rate of 66 with PACs, first-degree AV block, nonspecific T wave abnormality- No acute changes  Lab Results  Component Value Date   WBC 5.5 10/07/2021   HGB 11.5 (L) 10/07/2021   HCT 33.9 (L) 10/07/2021   MCV 91.1 10/07/2021   PLT 175 10/07/2021   Lab Results  Component Value Date   CREATININE 1.28 (H) 10/07/2021   BUN 23 10/07/2021   NA 133 (L) 10/07/2021   K 4.2 10/07/2021   CL 98 10/07/2021   CO2 26 10/07/2021   Lab Results  Component Value Date   ALT 16 10/07/2021   AST 19 10/07/2021   ALKPHOS 53 10/07/2021   BILITOT 0.6 10/07/2021   Lab Results  Component Value Date   CHOL 101 04/04/2021   HDL 40 04/04/2021   LDLCALC 43 04/04/2021   TRIG 96 04/04/2021  CHOLHDL 2.6 12/21/2020    Lab Results  Component Value Date   HGBA1C 6.4 (H) 10/03/2021    Assessment & Plan   1.  Chronic dyspnea on exertion has recently followed up with pulmonary.  COPD stage IIc.  He was asked to start any  inhalers primarily the Anoro given that he was given the okay for clinic.  He is concerned with the inhalers causing palpitations or elevated heart rates with his previous bouts of atrial fibrillation in the morning primarily because of the be tachycardic.  He states that his dyspnea on exertion is unchanged for the last 6 months at this time and remained stable.  He does continue to have pulmonary follow-up in April 2024.  2.  Coronary artery disease of native coronary artery status post myocardial infarction with stent placement to the right coronary artery in 12/2018.  He continues on clopidogrel and apixaban in lieu of aspirin due to history of atrial fibrillation, long-acting nitrate and statin.  Repeat heart catheterization revealed a patent stent to the RCA.  He has also been advised that he can stop his clopidogrel in November and continue with his apixaban to minimize risk of bleeding.  He continues to remain chest pain-free.  No ischemic changes noted on EKG.  3.  Paroxysmal atrial fibrillation status post ablation x2.  Remains in sinus rhythm today with EKG with a rate of 66 with PACs.  He is continued on Toprol-XL 25 mg daily and apixaban 5 mg twice daily.  4.  Essential hypertension with blood pressure today 158/84.  Recheck of 148/84.  Patient states that he is having increased blood pressures at home in the evening ranging 160s and 170s and has been taking benazepril 40 mg as needed at home that he had already previously stopped taking prior.  He states that point time that his blood pressure does come down.  So he is continued on Toprol XL and benazepril 20 mg daily for better blood pressure control.  We will repeat a BMP in 2 weeks to evaluate kidney function after reinitiating ACEi.  He has also been encouraged to continue to monitor his blood pressure at home.  5.  Hyperlipidemia with LDL 43, which is currently at goal, continue rosuvastatin 5 mg daily  6.  Disposition patient return to  clinic to see MD/APP in 1 month to reevaluate blood pressure after restarting benazepril and to follow-up on dyspnea on exertion.  Kalub Morillo, NP 12/06/2021, 12:31 PM

## 2021-12-06 NOTE — Patient Instructions (Signed)
Medication Instructions:  Your physician has recommended you make the following change in your medication:   RESTART Benazepril 20 mg once daily   *If you need a refill on your cardiac medications before your next appointment, please call your pharmacy*   Lab Work: BMP in 2 weeks over at the [facility]. Go to registration and check in. No appointment is needed.   If you have labs (blood work) drawn today and your tests are completely normal, you will receive your results only by: [place] (if you have MyChart) OR A paper copy in the mail If you have any lab test that is abnormal or we need to change your treatment, we will call you to review the results.   Testing/Procedures: None   Follow-Up: At [facility], you and your health needs are our priority.  As part of our continuing mission to provide you with exceptional heart care, we have created designated Provider Care Teams.  These Care Teams include your primary Cardiologist (physician) and Advanced Practice Providers (APPs -  Physician Assistants and Nurse Practitioners) who all work together to provide you with the care you need, when you need it.  Your next appointment:   1 month(s)  The format for your next appointment:   In Person  Provider:   Kathlyn Sacramento, MD or Gerrie Nordmann, NP       Important Information About Sugar

## 2021-12-12 ENCOUNTER — Encounter (INDEPENDENT_AMBULATORY_CARE_PROVIDER_SITE_OTHER): Payer: Self-pay

## 2021-12-15 ENCOUNTER — Other Ambulatory Visit: Payer: Self-pay | Admitting: Nurse Practitioner

## 2021-12-23 ENCOUNTER — Other Ambulatory Visit: Payer: Self-pay | Admitting: Nurse Practitioner

## 2021-12-23 DIAGNOSIS — E1143 Type 2 diabetes mellitus with diabetic autonomic (poly)neuropathy: Secondary | ICD-10-CM

## 2021-12-23 NOTE — Telephone Encounter (Signed)
Unable to refill per protocol, Rx expired. Medication was discontinued 12/23/20. Will refuse.  Requested Prescriptions  Pending Prescriptions Disp Refills   metFORMIN (GLUCOPHAGE-XR) 500 MG 24 hr tablet [Pharmacy Med Name: METFORMIN HCL ER 500 MG TABLET] 180 tablet 0    Sig: Take 1 tablet (500 mg total) by mouth 2 (two) times daily.     Endocrinology:  Diabetes - Biguanides Failed - 12/23/2021  1:58 PM      Failed - Cr in normal range and within 360 days    Creatinine  Date Value Ref Range Status  07/01/2012 1.27 0.60 - 1.30 mg/dL Final   Creatinine, Ser  Date Value Ref Range Status  10/07/2021 1.28 (H) 0.61 - 1.24 mg/dL Final         Failed - eGFR in normal range and within 360 days    EGFR (African American)  Date Value Ref Range Status  07/01/2012 >60  Final   GFR calc Af Amer  Date Value Ref Range Status  02/26/2020 64 >59 mL/min/1.73 Final    Comment:    **In accordance with recommendations from the NKF-ASN Task force,**   [facility] is in the process of updating its eGFR calculation to the   2021 CKD-EPI creatinine equation that estimates kidney function   without a race variable.    EGFR (Non-African Amer.)  Date Value Ref Range Status  07/01/2012 57 (L)  Final    Comment:    eGFR values <21m/min/1.73 m2 may be an indication of chronic kidney disease (CKD). Calculated eGFR is useful in patients with stable renal function. The eGFR calculation will not be reliable in acutely ill patients when serum creatinine is changing rapidly. It is not useful in  patients on dialysis. The eGFR calculation may not be applicable to patients at the low and high extremes of body sizes, pregnant women, and vegetarians.    GFR, Estimated  Date Value Ref Range Status  10/07/2021 57 (L) >60 mL/min Final    Comment:    (NOTE) Calculated using the CKD-EPI Creatinine Equation (2021)    eGFR  Date Value Ref Range Status  09/05/2021 51 (L) >59 mL/min/1.73 Final         Passed -  HBA1C is between 0 and 7.9 and within 180 days    HB A1C (BAYER DCA - WAIVED)  Date Value Ref Range Status  10/03/2021 6.4 (H) 4.8 - 5.6 % Final    Comment:             Prediabetes: 5.7 - 6.4          Diabetes: >6.4          Glycemic control for adults with diabetes: <7.0          Passed - B12 Level in normal range and within 720 days    Vitamin B-12  Date Value Ref Range Status  08/04/2021 602 232 - 1,245 pg/mL Final         Passed - Valid encounter within last 6 months    Recent Outpatient Visits           1 month ago Chronic obstructive pulmonary disease, unspecified COPD type ([place]   2 months ago Diabetes mellitus with proteinuria [place]   3 months ago Shortness of breath   [facility] [place] [facility]   4 months ago Fatigue, unspecified type   [facility] [name], Jolene T, NP   4 months ago Type 2 diabetes mellitus with diabetic autonomic neuropathy, without long-term current use of insulin ([place])   [place], [name], NP       Future Appointments             In 1 week [name], [name], NP [employer]   In 2 weeks Gerrie Nordmann, NP [place] [place]   In 1 month Jonathon Bellows, MD [place] within normal limits and completed in the last 12 months    WBC  Date Value Ref Range Status  10/07/2021 5.5 4.0 - 10.5 K/uL Final   RBC  Date Value Ref Range Status  10/07/2021 3.72 (L) 4.22 - 5.81 MIL/uL Final   Hemoglobin  Date Value Ref Range Status  10/07/2021 11.5 (L) 13.0 - 17.0 g/dL Final  09/05/2021 11.6 (L) 13.0 - 17.7 g/dL Final   HCT  Date Value Ref Range Status  10/07/2021 33.9 (L) 39.0 - 52.0 % Final   Hematocrit  Date Value Ref Range Status  09/05/2021 34.0 (L) 37.5 - 51.0 % Final   MCHC  Date  Value Ref Range Status  10/07/2021 33.9 30.0 - 36.0 g/dL Final   [facility]  Date Value Ref Range Status  10/07/2021 30.9 26.0 - 34.0 pg Final   MCV  Date Value Ref Range Status  10/07/2021 91.1 80.0 - 100.0 fL Final  09/05/2021 90 79 - 97 fL Final   No results found for: "PLTCOUNTKUC", "LABPLAT", "POCPLA" RDW  Date Value Ref Range Status  10/07/2021 13.1 11.5 - 15.5 % Final  09/05/2021 13.0 11.6 - 15.4 % Final

## 2021-12-31 NOTE — Patient Instructions (Signed)
Diabetes Mellitus Basics  Diabetes mellitus, or diabetes, is a long-term (chronic) disease. It occurs when the body does not properly use sugar (glucose) that is released from food after you eat. Diabetes mellitus may be caused by one or both of these problems: Your pancreas does not make enough of a hormone called insulin. Your body does not react in a normal way to the insulin that it makes. Insulin lets glucose enter cells in your body. This gives you energy. If you have diabetes, glucose cannot get into cells. This causes high blood glucose (hyperglycemia). How to treat and manage diabetes You may need to take insulin or other diabetes medicines daily to keep your glucose in balance. If you are prescribed insulin, you will learn how to give yourself insulin by injection. You may need to adjust the amount of insulin you take based on the foods that you eat. You will need to check your blood glucose levels using a glucose monitor as told by your health care provider. The readings can help determine if you have low or high blood glucose. Generally, you should have these blood glucose levels: Before meals (preprandial): 80-130 mg/dL (4.4-7.2 mmol/L). After meals (postprandial): below 180 mg/dL (10 mmol/L). Hemoglobin A1c (HbA1c) level: less than 7%. Your health care provider will set treatment goals for you. Keep all follow-up visits. This is important. Follow these instructions at home: Diabetes medicines Take your diabetes medicines every day as told by your health care provider. List your diabetes medicines here: Name of medicine: ______________________________ Amount (dose): _______________ Time (a.m./p.m.): _______________ Notes: ___________________________________ Name of medicine: ______________________________ Amount (dose): _______________ Time (a.m./p.m.): _______________ Notes: ___________________________________ Name of medicine: ______________________________ Amount (dose):  _______________ Time (a.m./p.m.): _______________ Notes: ___________________________________ Insulin If you use insulin, list the types of insulin you use here: Insulin type: ______________________________ Amount (dose): _______________ Time (a.m./p.m.): _______________Notes: ___________________________________ Insulin type: ______________________________ Amount (dose): _______________ Time (a.m./p.m.): _______________ Notes: ___________________________________ Insulin type: ______________________________ Amount (dose): _______________ Time (a.m./p.m.): _______________ Notes: ___________________________________ Insulin type: ______________________________ Amount (dose): _______________ Time (a.m./p.m.): _______________ Notes: ___________________________________ Insulin type: ______________________________ Amount (dose): _______________ Time (a.m./p.m.): _______________ Notes: ___________________________________ Managing blood glucose  Check your blood glucose levels using a glucose monitor as told by your health care provider. Write down the times that you check your glucose levels here: Time: _______________ Notes: ___________________________________ Time: _______________ Notes: ___________________________________ Time: _______________ Notes: ___________________________________ Time: _______________ Notes: ___________________________________ Time: _______________ Notes: ___________________________________ Time: _______________ Notes: ___________________________________  Low blood glucose Low blood glucose (hypoglycemia) is when glucose is at or below 70 mg/dL (3.9 mmol/L). Symptoms may include: Feeling: Hungry. Sweaty and clammy. Irritable or easily upset. Dizzy. Sleepy. Having: A fast heartbeat. A headache. A change in your vision. Numbness around the mouth, lips, or tongue. Having trouble with: Moving (coordination). Sleeping. Treating low blood glucose To treat low blood  glucose, eat or drink something containing sugar right away. If you can think clearly and swallow safely, follow the 15:15 rule: Take 15 grams of a fast-acting carb (carbohydrate), as told by your health care provider. Some fast-acting carbs are: Glucose tablets: take 3-4 tablets. Hard candy: eat 3-5 pieces. Fruit juice: drink 4 oz (120 mL). Regular (not diet) soda: drink 4-6 oz (120-180 mL). Honey or sugar: eat 1 Tbsp (15 mL). Check your blood glucose levels 15 minutes after you take the carb. If your glucose is still at or below 70 mg/dL (3.9 mmol/L), take 15 grams of a carb again. If your glucose does not go above 70 mg/dL (3.9 mmol/L) after   3 tries, get help right away. After your glucose goes back to normal, eat a meal or a snack within 1 hour. Treating very low blood glucose If your glucose is at or below 54 mg/dL (3 mmol/L), you have very low blood glucose (severe hypoglycemia). This is an emergency. Do not wait to see if the symptoms will go away. Get medical help right away. Call your local emergency services (911 in the [place] Do not drive yourself to the hospital. Questions to ask your health care provider Should I talk with a diabetes educator? What equipment will I need to care for myself at home? What diabetes medicines do I need? When should I take them? How often do I need to check my blood glucose levels? What number can I call if I have questions? When is my follow-up visit? Where can I find a support group for people with diabetes? Where to find more information American Diabetes Association: www.diabetes.org Association of Diabetes Care and Education Specialists: www.diabeteseducator.org Contact a health care provider if: Your blood glucose is at or above 240 mg/dL (13.3 mmol/L) for 2 days in a row. You have been sick or have had a fever for 2 days or more, and you are not getting better. You have any of these problems for more than 6 hours: You cannot eat or  drink. You feel nauseous. You vomit. You have diarrhea. Get help right away if: Your blood glucose is lower than 54 mg/dL (3 mmol/L). You get confused. You have trouble thinking clearly. You have trouble breathing. These symptoms may represent a serious problem that is an emergency. Do not wait to see if the symptoms will go away. Get medical help right away. Call your local emergency services (911 in the [place] Do not drive yourself to the hospital. Summary Diabetes mellitus is a chronic disease that occurs when the body does not properly use sugar (glucose) that is released from food after you eat. Take insulin and diabetes medicines as told. Check your blood glucose every day, as often as told. Keep all follow-up visits. This is important. This information is not intended to replace advice given to you by your health care provider. Make sure you discuss any questions you have with your health care provider. Document Revised: 06/03/2019 Document Reviewed: 06/03/2019 Elsevier Patient Education  2023 Elsevier Inc.  

## 2022-01-02 ENCOUNTER — Ambulatory Visit (INDEPENDENT_AMBULATORY_CARE_PROVIDER_SITE_OTHER): Payer: Medicare Other | Admitting: Nurse Practitioner

## 2022-01-02 ENCOUNTER — Encounter: Payer: Self-pay | Admitting: Nurse Practitioner

## 2022-01-02 VITALS — BP 138/78 | HR 64 | Temp 98.0°F | Ht 72.99 in | Wt 188.0 lb

## 2022-01-02 DIAGNOSIS — R809 Proteinuria, unspecified: Secondary | ICD-10-CM | POA: Diagnosis not present

## 2022-01-02 DIAGNOSIS — E1169 Type 2 diabetes mellitus with other specified complication: Secondary | ICD-10-CM | POA: Diagnosis not present

## 2022-01-02 DIAGNOSIS — D6869 Other thrombophilia: Secondary | ICD-10-CM | POA: Diagnosis not present

## 2022-01-02 DIAGNOSIS — I25110 Atherosclerotic heart disease of native coronary artery with unstable angina pectoris: Secondary | ICD-10-CM | POA: Diagnosis not present

## 2022-01-02 DIAGNOSIS — N1831 Chronic kidney disease, stage 3a: Secondary | ICD-10-CM | POA: Diagnosis not present

## 2022-01-02 DIAGNOSIS — G4733 Obstructive sleep apnea (adult) (pediatric): Secondary | ICD-10-CM | POA: Diagnosis not present

## 2022-01-02 DIAGNOSIS — E785 Hyperlipidemia, unspecified: Secondary | ICD-10-CM

## 2022-01-02 DIAGNOSIS — Z9689 Presence of other specified functional implants: Secondary | ICD-10-CM

## 2022-01-02 DIAGNOSIS — I152 Hypertension secondary to endocrine disorders: Secondary | ICD-10-CM | POA: Diagnosis not present

## 2022-01-02 DIAGNOSIS — E1159 Type 2 diabetes mellitus with other circulatory complications: Secondary | ICD-10-CM | POA: Diagnosis not present

## 2022-01-02 DIAGNOSIS — K219 Gastro-esophageal reflux disease without esophagitis: Secondary | ICD-10-CM | POA: Diagnosis not present

## 2022-01-02 DIAGNOSIS — G894 Chronic pain syndrome: Secondary | ICD-10-CM | POA: Diagnosis not present

## 2022-01-02 DIAGNOSIS — E1129 Type 2 diabetes mellitus with other diabetic kidney complication: Secondary | ICD-10-CM | POA: Diagnosis not present

## 2022-01-02 DIAGNOSIS — E1143 Type 2 diabetes mellitus with diabetic autonomic (poly)neuropathy: Secondary | ICD-10-CM

## 2022-01-02 DIAGNOSIS — M79661 Pain in right lower leg: Secondary | ICD-10-CM

## 2022-01-02 DIAGNOSIS — R0602 Shortness of breath: Secondary | ICD-10-CM

## 2022-01-02 DIAGNOSIS — J449 Chronic obstructive pulmonary disease, unspecified: Secondary | ICD-10-CM

## 2022-01-02 DIAGNOSIS — I480 Paroxysmal atrial fibrillation: Secondary | ICD-10-CM | POA: Diagnosis not present

## 2022-01-02 LAB — BAYER DCA HB A1C WAIVED: HB A1C (BAYER DCA - WAIVED): 6.5 % — ABNORMAL HIGH (ref 4.8–5.6)

## 2022-01-02 NOTE — Assessment & Plan Note (Signed)
Chronic, ongoing.  Followed by cardiology at this time.  Continue current medication regimen and collaboration, appreciate their input.  HR regular today with stable rate on exam. 

## 2022-01-02 NOTE — Assessment & Plan Note (Signed)
Chronic, ongoing with A1c 6.5% today and urine ALB 80 (February 2023).  Significant decreased sensation bilateral feet, monitor closely for wounds and falls.  Continue current diabetes medication regimen and adjust as needed. He did not get benefit from Lyrica or Gabapentin, will continue collaboration with neurology and pain clinic.  Continue to monitor BS daily and document for visits.

## 2022-01-02 NOTE — Assessment & Plan Note (Signed)
Improving at this time with change in cardiac medications, continue collaboration with cardiology and pulmonary.

## 2022-01-02 NOTE — Assessment & Plan Note (Signed)
Chronic, stable with Omeprazole daily.  Continue this regimen and adjust as needed.  Mag level today.

## 2022-01-02 NOTE — Assessment & Plan Note (Signed)
Chronic, ongoing -- above goal initial checks and at home.  A1c today 6.5%.  Continue Metformin and current cardiac medications, with exception of recommend he increase Benazepril to full tablet = 20 MG due to ongoing BP elevations.  Will continue collaboration with cardiology, appreciate their input.  Recommend he continue to check BP at home at least 3 mornings a week and document.  DASH diet.  LABS: CMP.

## 2022-01-02 NOTE — Progress Notes (Signed)
BP 138/78 (BP Location: Left Arm, Patient Position: Sitting, Cuff Size: Normal)   Pulse 64   Temp 98 F (36.7 C)   Ht 6' 0.99" (1.854 m)   Wt 188 lb (85.3 kg)   SpO2 98%   BMI 24.81 kg/m    Subjective:    Patient ID: Sean Hacker., male    DOB: 08-04-1940, 81 y.o.   MRN: [id number]  HPI: Sean Vallery. is a 81 y.o. male  Chief Complaint  Patient presents with   Diabetes   Hypertension   Hyperlipidemia   COPD   Daughter at bedside to assist with HPI  DIABETES WITH NEUROPATHY A1c 6.4% in August.  Continues Metformin 500 MG BID. Hypoglycemic episodes:no Polydipsia/polyuria: no Visual disturbance: no Chest pain: no Paresthesias: no Glucose Monitoring: yes  Accucheck frequency: Daily  Fasting glucose: 120 to 130 on average  Post prandial:  Evening:  Before meals: Taking Insulin?: no  Long acting insulin:  Short acting insulin: Blood Pressure Monitoring: daily Retinal Examination: Up To Date -- Dr. Atilano Median in [place] Exam: Up to Date Pneumovax: Up to Date Influenza: Up to Date Aspirin: no    HYPERTENSION / HYPERLIPIDEMIA/A-FIB Last saw cardiology on 12/06/21, has been having some elevations in BP, they restarted Benazepril 1/2 tablet (10 MG).  Currently taking Metoprolol XL and Benazepril.  Taking Rosuvastatin 5 MG + Imdur.  Is on Plavix, but cardiology recommended he stop in November and continue Eliquis.  Last saw pulmonary on 11/28/21 -- not using inhalers anymore.  Underwent PCI with DES to the proximal RCA 12/22/20 due to NSTEMI. Satisfied with current treatment? yes Duration of hypertension: chronic BP monitoring frequency: a few times a week BP range: last night 178/95, during day 140/80 range BP medication side effects: no Duration of hyperlipidemia: chronic Cholesterol medication side effects: no Cholesterol supplements: none Medication compliance: good compliance Aspirin: no Recent stressors: no Recurrent headaches: no Visual changes:  no Palpitations: no Dyspnea: occasional - if really active Chest pain: no Lower extremity edema: no Dizzy/lightheaded: no  GERD Continues on Omeprazole.  Follows with GI as food continues to get hung from time to time. GERD control status: stable Satisfied with current treatment? yes Heartburn frequency: none Medication side effects: no  Medication compliance: fluctuating Dysphagia: no Odynophagia:  no Hematemesis: no Blood in stool: no EGD: yes   CHRONIC KIDNEY DISEASE Continues Tamsulosin and Vesicare.  He is seeing urology and last saw 05/25/21 CKD status: stable Medications renally dose: yes Previous renal evaluation: no Pneumovax:  Up to Date Influenza Vaccine:  Up to Date  Lab Results  Component Value Date   CREATININE 1.28 (H) 10/07/2021   CREATININE 1.40 (H) 09/05/2021   CREATININE 1.30 (H) 08/05/2021    CHRONIC PAIN  Has pain stimulator.  Has ongoing neuropathy issues -- difficulty with balance due to this. Saw  neurosurgery last on 06/10/21 for ongoing weakness/neuropathy.  Have tried Gabapentin, Lyrica without benefit.  Saw neurology on 04/28/21 - they reported no further interventions available.  Has area to right shin that is painful after hitting against trailer hitch, then previous PCP cauterized area -- has ongoing nerve pain to area. Pain control status: stable Duration: years Location: back Quality: dull, aching, and throbbing Current Pain Level: moderate Previous Pain Level: moderate Breakthrough pain: no Benefit from narcotic medications:  not checking What Activities task can be accomplished with current medication? yes Interested in weaning off narcotics:no   Stool softners/OTC fiber: no  Previous pain specialty evaluation:  yes Non-narcotic analgesic meds: no Narcotic contract: no  Relevant past medical, surgical, family and social history reviewed and updated as indicated. Interim medical history since our last visit reviewed. Allergies and  medications reviewed and updated.  Review of Systems  Constitutional:  Negative for activity change, appetite change, diaphoresis, fatigue and fever.  Respiratory:  Negative for cough, chest tightness, shortness of breath and wheezing.   Cardiovascular:  Negative for chest pain, palpitations and leg swelling.  Gastrointestinal: Negative.   Endocrine: Negative for cold intolerance, heat intolerance, polydipsia, polyphagia and polyuria.  Neurological: Negative.   Psychiatric/Behavioral: Negative.     Per HPI unless specifically indicated above     Objective:    BP 138/78 (BP Location: Left Arm, Patient Position: Sitting, Cuff Size: Normal)   Pulse 64   Temp 98 F (36.7 C)   Ht 6' 0.99" (1.854 m)   Wt 188 lb (85.3 kg)   SpO2 98%   BMI 24.81 kg/m   Wt Readings from Last 3 Encounters:  01/02/22 188 lb (85.3 kg)  12/06/21 187 lb (84.8 kg)  11/28/21 186 lb 3.2 oz (84.5 kg)    Physical Exam Vitals and nursing note reviewed.  Constitutional:      General: He is awake. He is not in acute distress.    Appearance: He is well-developed and well-groomed. He is not ill-appearing.  HENT:     Head: Normocephalic and atraumatic.     Right Ear: Hearing normal. No drainage.     Left Ear: Hearing normal. No drainage.  Eyes:     General: Lids are normal.        Right eye: No discharge.        Left eye: No discharge.     Conjunctiva/sclera: Conjunctivae normal.     Pupils: Pupils are equal, round, and reactive to light.  Neck:     Thyroid: No thyromegaly.     Vascular: No carotid bruit.     Trachea: Trachea normal.  Cardiovascular:     Rate and Rhythm: Normal rate and regular rhythm.     Heart sounds: Normal heart sounds, S1 normal and S2 normal. No murmur heard.    No gallop.  Pulmonary:     Effort: Pulmonary effort is normal. No accessory muscle usage or respiratory distress.     Breath sounds: Normal breath sounds.  Abdominal:     General: Bowel sounds are normal.      Palpations: Abdomen is soft.  Musculoskeletal:        General: Normal range of motion.     Cervical back: Normal range of motion and neck supple.     Right lower leg: No edema.     Left lower leg: No edema.  Lymphadenopathy:     Cervical: No cervical adenopathy.  Skin:    General: Skin is warm and dry.     Capillary Refill: Capillary refill takes less than 2 seconds.  Neurological:     Mental Status: He is alert and oriented to person, place, and time.     Deep Tendon Reflexes:     Reflex Scores:      Patellar reflexes are 1+ on the right side and 1+ on the left side.      Achilles reflexes are 1+ on the right side and 1+ on the left side.    Comments: Antalgic gait, using cane.  Psychiatric:        Attention and Perception: Attention normal.  Mood and Affect: Mood normal.        Speech: Speech normal.        Behavior: Behavior normal. Behavior is cooperative.        Thought Content: Thought content normal.    Results for orders placed or performed in visit on 01/02/22  Bayer DCA Hb A1c Waived  Result Value Ref Range   HB A1C (BAYER DCA - WAIVED) 6.5 (H) 4.8 - 5.6 %      Assessment & Plan:   Problem List Items Addressed This Visit       Cardiovascular and Mediastinum   AF (paroxysmal atrial fibrillation) (HCC)    Chronic, ongoing.  Followed by cardiology at this time.  Continue current medication regimen and collaboration, appreciate their input.  HR regular today with stable rate on exam.      Relevant Orders   Comprehensive metabolic panel   Lipid Panel w/o Chol/HDL Ratio   Coronary artery disease    Ongoing, continue collaboration with cardiology and current medication regimen.  Recent notes reviewed.  Discussed with him to stop Plavix as recommend by cardiology.      Relevant Orders   Comprehensive metabolic panel   Lipid Panel w/o Chol/HDL Ratio   Hypertension associated with diabetes ([place])    Chronic, ongoing -- above goal initial checks and at home.   A1c today 6.5%.  Continue Metformin and current cardiac medications, with exception of recommend he increase Benazepril to full tablet = 20 MG due to ongoing BP elevations.  Will continue collaboration with cardiology, appreciate their input.  Recommend he continue to check BP at home at least 3 mornings a week and document.  DASH diet.  LABS: CMP.      Relevant Orders   Bayer DCA Hb A1c Waived (Completed)   Comprehensive metabolic panel     Respiratory   COPD (chronic obstructive pulmonary disease) (HCC)    Followed by pulmonary at this time, continue this collaboration.  He is not using inhalers.  Reports some improvement in SOB with change in cardiac medications.      OSA (obstructive sleep apnea)    Chronic, ongoing -- continue 100% use of CPAP.        Digestive   GERD without esophagitis    Chronic, stable with Omeprazole daily.  Continue this regimen and adjust as needed.  Mag level today.      Relevant Orders   Magnesium     Endocrine   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HCC)    Chronic, ongoing with A1c 6.5% today and urine ALB 80 (February 2023).  Significant decreased sensation bilateral feet, monitor closely for wounds and falls.  Continue current diabetes medication regimen and adjust as needed. He did not get benefit from Lyrica or Gabapentin, will continue collaboration with neurology and pain clinic.  Continue to monitor BS daily and document for visits.      Relevant Orders   Bayer DCA Hb A1c Waived (Completed)   Diabetes mellitus with proteinuria (HCC) - Primary    Chronic, ongoing with A1c 6.4% today and urine ALB 80 (February 2023).  Continues to have stable BS control.  Will continue current medication regimen and adjust as needed.  Recommend he check BS at least 3 times daily.  Continue Benazepril for kidney protection.      Relevant Orders   Bayer DCA Hb A1c Waived (Completed)   Hyperlipidemia associated with type 2 diabetes mellitus (HCC)     Chronic, ongoing.  Continue  current medication regimen, tolerating 5 MG Crestor well without ADR, and adjust as needed.  Lipid panel today.      Relevant Orders   Comprehensive metabolic panel   Lipid Panel w/o Chol/HDL Ratio     Genitourinary   CKD (chronic kidney disease) stage 3, GFR 30-59 ml/min (HCC)    Chronic, ongoing.  Continue to monitor closely and refer to nephrology if decline.  Continue Benazepril for kidney protection and proteinuria.  Renal dose medications as needed based on labs.        Relevant Orders   Comprehensive metabolic panel     Hematopoietic and Hemostatic   Acquired thrombophilia ([place])    Patient on Eliquis with A-fib, monitor CBC regularly.        Other   Chronic pain syndrome    Chronic, ongoing.  Followed by neurosurgery and pain management as needed.  Continue this collaboration and current regimen.      Pain in right shin    Ongoing for several years, has seen various specialists.  Consider [facility] dermatology in future.      Shortness of breath on exertion    Improving at this time with change in cardiac medications, continue collaboration with cardiology and pulmonary.      Spinal cord stimulator status    Continue collaboration with pain management.        Follow up plan: Return in about 3 months (around 04/04/2022) for T2DM, HTN/HLD, A-FIB, GERD.

## 2022-01-02 NOTE — Assessment & Plan Note (Signed)
Chronic, ongoing.  Continue to monitor closely and refer to nephrology if decline.  Continue Benazepril for kidney protection and proteinuria.  Renal dose medications as needed based on labs.   

## 2022-01-02 NOTE — Assessment & Plan Note (Signed)
Ongoing, continue collaboration with cardiology and current medication regimen.  Recent notes reviewed.  Discussed with him to stop Plavix as recommend by cardiology.

## 2022-01-02 NOTE — Assessment & Plan Note (Signed)
Chronic, ongoing -- continue 100% use of CPAP.

## 2022-01-02 NOTE — Assessment & Plan Note (Signed)
Patient on Eliquis with A-fib, monitor CBC regularly. 

## 2022-01-02 NOTE — Assessment & Plan Note (Signed)
Continue collaboration with pain management. 

## 2022-01-02 NOTE — Assessment & Plan Note (Signed)
Ongoing for several years, has seen various specialists.  Consider [facility] dermatology in future.

## 2022-01-02 NOTE — Assessment & Plan Note (Signed)
Chronic, ongoing.  Continue current medication regimen, tolerating 5 MG Crestor well without ADR, and adjust as needed.  Lipid panel today. 

## 2022-01-02 NOTE — Assessment & Plan Note (Signed)
Followed by pulmonary at this time, continue this collaboration.  He is not using inhalers.  Reports some improvement in SOB with change in cardiac medications.

## 2022-01-02 NOTE — Assessment & Plan Note (Signed)
Chronic, ongoing.  Followed by neurosurgery and pain management as needed.  Continue this collaboration and current regimen.

## 2022-01-02 NOTE — Assessment & Plan Note (Signed)
Chronic, ongoing with A1c 6.4% today and urine ALB 80 (February 2023).  Continues to have stable BS control.  Will continue current medication regimen and adjust as needed.  Recommend he check BS at least 3 times daily.  Continue Benazepril for kidney protection.

## 2022-01-03 LAB — COMPREHENSIVE METABOLIC PANEL
ALT: 11 IU/L (ref 0–44)
AST: 16 IU/L (ref 0–40)
Albumin/Globulin Ratio: 1.6 (ref 1.2–2.2)
Albumin: 4 g/dL (ref 3.8–4.8)
Alkaline Phosphatase: 66 IU/L (ref 44–121)
BUN/Creatinine Ratio: 14 (ref 10–24)
BUN: 17 mg/dL (ref 8–27)
Bilirubin Total: 0.4 mg/dL (ref 0.0–1.2)
CO2: 25 mmol/L (ref 20–29)
Calcium: 9.4 mg/dL (ref 8.6–10.2)
Chloride: 101 mmol/L (ref 96–106)
Creatinine, Ser: 1.22 mg/dL (ref 0.76–1.27)
Globulin, Total: 2.5 g/dL (ref 1.5–4.5)
Glucose: 215 mg/dL — ABNORMAL HIGH (ref 70–99)
Potassium: 4.1 mmol/L (ref 3.5–5.2)
Sodium: 138 mmol/L (ref 134–144)
Total Protein: 6.5 g/dL (ref 6.0–8.5)
eGFR: 60 mL/min/{1.73_m2} (ref >59)

## 2022-01-03 LAB — LIPID PANEL W/O CHOL/HDL RATIO
Cholesterol, Total: 103 mg/dL (ref 100–199)
HDL: 43 mg/dL (ref >39)
LDL Chol Calc (NIH): 44 mg/dL (ref 0–99)
Triglycerides: 75 mg/dL (ref 0–149)
VLDL Cholesterol Cal: 16 mg/dL (ref 5–40)

## 2022-01-03 LAB — MAGNESIUM: Magnesium: 1.6 mg/dL (ref 1.6–2.3)

## 2022-01-03 NOTE — Progress Notes (Signed)
Contacted via [place] afternoon [place], your labs have returned: - Kidney function, creatinine and eGFR, remains normal, as is liver function, AST and ALT.  - Overall remainder of labs stable.  No medication changes needed. Keep being amazing!!  Thank you for allowing me to participate in your care.  I appreciate you. Kindest regards, Derwin Reddy

## 2022-01-10 ENCOUNTER — Ambulatory Visit: Payer: Medicare Other | Attending: Cardiology | Admitting: Cardiology

## 2022-01-10 ENCOUNTER — Encounter: Payer: Self-pay | Admitting: Cardiology

## 2022-01-10 VITALS — BP 160/80 | HR 65 | Ht 73.0 in | Wt 187.6 lb

## 2022-01-10 DIAGNOSIS — I2510 Atherosclerotic heart disease of native coronary artery without angina pectoris: Secondary | ICD-10-CM | POA: Diagnosis not present

## 2022-01-10 DIAGNOSIS — R0609 Other forms of dyspnea: Secondary | ICD-10-CM

## 2022-01-10 DIAGNOSIS — I480 Paroxysmal atrial fibrillation: Secondary | ICD-10-CM | POA: Diagnosis not present

## 2022-01-10 DIAGNOSIS — E785 Hyperlipidemia, unspecified: Secondary | ICD-10-CM

## 2022-01-10 DIAGNOSIS — I10 Essential (primary) hypertension: Secondary | ICD-10-CM | POA: Diagnosis not present

## 2022-01-10 MED ORDER — AMLODIPINE BESYLATE 5 MG PO TABS: 5.0000 mg | ORAL_TABLET | Freq: Every day | ORAL | 3 refills | Status: DC

## 2022-01-10 NOTE — Patient Instructions (Signed)
Medication Instructions:  START: Amlodipine 5 mg daily at bedtime  Please check blood pressure 1-2 hours after taking morning medication.  *If you need a refill on your cardiac medications before your next appointment, please call your pharmacy*   Lab Work: None ordered today   Testing/Procedures: None ordered today   Follow-Up: At [facility], you and your health needs are our priority.  As part of our continuing mission to provide you with exceptional heart care, we have created designated Provider Care Teams.  These Care Teams include your primary Cardiologist (physician) and Advanced Practice Providers (APPs -  Physician Assistants and Nurse Practitioners) who all work together to provide you with the care you need, when you need it.  We recommend signing up for the patient portal called "MyChart".  Sign up information is provided on this After Visit Summary.  MyChart is used to connect with patients for Virtual Visits (Telemedicine).  Patients are able to view lab/test results, encounter notes, upcoming appointments, etc.  Non-urgent messages can be sent to your provider as well.   To learn more about what you can do with MyChart, go to [email or web address].    Your next appointment:   1 month(s)  The format for your next appointment:   In Person  Provider:   You may see Kathlyn [place], MD or one of the following Advanced Practice Providers on your designated Care Team:   Murray Hodgkins, NP Christell Faith, PA-C Cadence Kathlen Mody, PA-C Gerrie Nordmann, NP

## 2022-01-10 NOTE — Progress Notes (Signed)
Cardiology Clinic Note   Patient Name: Sean Reyes. Date of Encounter: 01/10/2022  Primary Care Provider:  [name], NP Primary Cardiologist:  Kathlyn Sacramento, MD  Patient Profile    81 year old male with past medical history of paroxysmal atrial fibrillation, coronary artery disease, abnormal EKG with PVCs, essential pretension, hyperlipidemia, type 2 diabetes, chronic kidney disease who is here today for follow-up on his paroxysmal atrial fibrillation.  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   Hyperlipidemia    Hypertension    Myocardial infarction ([place]) 12/2020   Nocturia    S/P ablation of atrial fibrillation    Ablative therapy   Sleep apnea    CPAP    Spinal stenosis    Tachycardia, unspecified    Past Surgical History:  Procedure Laterality Date   ABLATION     ANTERIOR LAT LUMBAR FUSION N/A 06/27/2017   Procedure: Anterior Lateral Lumbar Interbody  Fusion - Lumbar Two-Lumbar Three - Lumbar Three-Lumbar Four, Posterior Lumbar Interbody Fusion Lumbar Four- Five;  Surgeon: Kary Kos, MD;  Location: [place];  Service: Neurosurgery;  Laterality: N/A;  Anterior Lateral Lumbar Interbody  Fusion - Lumbar Two-Lumbar Three - Lumbar Three-Lumbar Four, Posterior Lumbar Interbody Fusion Lumbar Four- Five   BACK SURGERY     CARDIAC CATHETERIZATION     CARDIOVERSION N/A 08/29/2018   Procedure: CARDIOVERSION;  Surgeon: Corey Skains, MD;  Location: [facility];  Service: Cardiovascular;   Laterality: N/A;   CARDIOVERSION N/A 09/24/2018   Procedure: CARDIOVERSION;  Surgeon: Corey Skains, MD;  Location: [facility];  Service: Cardiovascular;  Laterality: N/A;   COLONOSCOPY WITH PROPOFOL N/A 10/05/2015   Procedure: COLONOSCOPY WITH PROPOFOL;  Surgeon: Lollie Sails, MD;  Location: [facility] ENDOSCOPY;  Service: Endoscopy;  Laterality: N/A;   COLONOSCOPY WITH PROPOFOL N/A 11/01/2020   Procedure: COLONOSCOPY WITH PROPOFOL;  Surgeon: Jonathon Bellows, MD;  Location: [facility] ENDOSCOPY;  Service: Gastroenterology;  Laterality: N/A;   CORONARY STENT INTERVENTION N/A 12/22/2020   Procedure: CORONARY STENT INTERVENTION;  Surgeon: [name], MD;  Location: [place] [facility];  Service: Cardiovascular;  Laterality: N/A;   ESOPHAGOGASTRODUODENOSCOPY N/A 11/01/2020   Procedure: ESOPHAGOGASTRODUODENOSCOPY (EGD);  Surgeon: Jonathon Bellows, MD;  Location: [facility] ENDOSCOPY;  Service: Gastroenterology;  Laterality: N/A;   ESOPHAGOGASTRODUODENOSCOPY (EGD) WITH PROPOFOL N/A 04/01/2018   Procedure: ESOPHAGOGASTRODUODENOSCOPY (EGD) WITH PROPOFOL;  Surgeon: Lollie Sails, MD;  Location: [facility] ENDOSCOPY;  Service: Endoscopy;  Laterality: N/A;   HERNIA REPAIR     JOINT REPLACEMENT Bilateral    hips  RT+  LEFT X2    LEFT HEART CATH AND CORONARY ANGIOGRAPHY N/A 12/22/2020   Procedure: LEFT HEART CATH AND CORONARY ANGIOGRAPHY;  Surgeon: [name], MD;  Location: [place] [facility];  Service: Cardiovascular;  Laterality: N/A;   LEFT HEART CATH AND CORONARY ANGIOGRAPHY Left  08/23/2021   Procedure: LEFT HEART CATH AND CORONARY ANGIOGRAPHY;  Surgeon: Corey Skains, MD;  Location: [place] [facility];  Service: Cardiovascular;  Laterality: Left;   LUMBAR LAMINECTOMY/DECOMPRESSION MICRODISCECTOMY Left 09/13/2016   Procedure: Microdiscectomy - Lumbar two-three,  Lumbar three- - left;  Surgeon: Kary Kos, MD;  Location: [place];  Service: Neurosurgery;  Laterality: Left;   SPINAL CORD STIMULATOR  INSERTION  07/08/2019   TONSILLECTOMY      Allergies  Allergies  Allergen Reactions   Levaquin [Levofloxacin In D5w] Anaphylaxis and Shortness Of Breath   Shellfish Allergy Anaphylaxis    Has used duraprep, betadine and ioban in previous surgeries in 2019 and 2018 without issue   Amiodarone Other (See Comments)    Tremors and thyroid toxicity   Adhesive [Tape] Other (See Comments)    Little red bumps under the dressing.  He questions whether is latex related    History of Present Illness    Sean Reyes. is a 81 year old male with previous mentioned past medical history of paroxysmal atrial fibrillation with chronic anticoagulation with apixaban, coronary artery disease, PVCs, essential hypertension, hyperlipidemia, type 2 diabetes, and chronic kidney disease.  Cardiac catheterization was done in November 2022 which showed severe proximal RCA stenosis which was treated successfully with PCI/DES.  Repeat cardiac catheterization recently showed patent RCA stent with no significant in-stent restenosis.  There was borderline 60% stenosis in distal LAD and mid distal left circumflex disease.  He also went ablation procedure at [facility] many years ago and reports having complications during the procedure that necessitated aborting the procedure.  He subsequently had ablation done at [facility] with improvement.  He then was placed on propafenone which was later changed to metoprolol after his myocardial infarction earlier this year due to bradycardia was discontinued due to progressive lower heart rates.  Last echocardiogram done 09/07/2021 revealed LVEF of 60 to 65%, no regional wall motion abnormalities, G1 DD, trivial mitral valve regurgitation.  A ZIO monitor was also worn which revealed heart rates 46-167 bpm with average heart rate of 64 bpm.  Predominant underlying rhythm was sinus with first-degree AV block.  There was 1 run of ventricular tachycardia that occurred lasting 7 beats with a total of 96  supraventricular tachycardia runs which occurred with the fastest interval lasting 5 beats for maximal 167 bpm.  He was last seen in clinic 12/06/2021 and stated that overall he was doing fairly well except he continued to have exertional dyspnea.  He had been followed with pulmonary who prescribed inhalers but he was still hesitant to start his inhalers at that time.  He had previously been diagnosed with COPD Gold to ED.  PFTs were completed and chest CT which did not reveal any obstructive or restrictive disease.  He was restarted on previously discontinued benazepril due to elevating blood pressures and had a repeat BMP in 2 weeks to reevaluate his kidney function after reinitiation of ACE inhibitor.  BUN was 17 with a serum creatinine of 1.22 which is back to baseline with a GFR of 60 so he was continued on his current medication regimen.  He returns to clinic today stating that overall he has been feeling fairly well.  He believes that his shortness of breath has improved as he has tried to increase his activity and has noticed that his stamina has improved.  He continues not to use inhalers previously recommended by pulmonary as he is concerned about elevated heart rates PVCs and recurrence of atrial fibrillation.  He is concerned today as he has had elevated blood pressures at home and has been keeping a log of his blood pressures.  His PCP just recently increased his benazepril from 10 mg to 20 mg daily with stable kidney function.  He denies any chest pain, peripheral edema, dizziness, lightheadedness but does continue to endorse improving shortness of breath and palpitations.  Denies any recent hospitalizations or visits to the emergency department.  Home Medications    Current Outpatient Medications  Medication Sig Dispense Refill   amLODipine (NORVASC) 5 MG tablet Take 1 tablet (5 mg total) by mouth at bedtime. 90 tablet 3   benazepril (LOTENSIN) 20 MG tablet Take 1 tablet (20 mg total) by  mouth daily. 90 tablet 3   clopidogrel (PLAVIX) 75 MG tablet Take 75 mg by mouth daily.     cyanocobalamin 1000 MCG tablet Take by mouth.     ELIQUIS 5 MG TABS tablet Take 1 tablet (5 mg total) by mouth 2 (two) times daily. 180 tablet 1   ferrous sulfate 325 (65 FE) MG EC tablet Take 325 mg by mouth daily with breakfast.     finasteride (PROSCAR) 5 MG tablet Take 1 tablet (5 mg total) by mouth daily. 90 tablet 2   isosorbide mononitrate (IMDUR) 30 MG 24 hr tablet Take 30 mg by mouth daily.     metFORMIN (GLUCOPHAGE) 500 MG tablet Take 1 tablet (500 mg total) by mouth 2 (two) times daily with a meal. 180 tablet 4   metoprolol succinate (TOPROL XL) 25 MG 24 hr tablet Take 1 tablet (25 mg total) by mouth daily. 30 tablet 5   omeprazole (PRILOSEC) 40 MG capsule Take 1 capsule (40 mg total) by mouth in the morning and at bedtime. 180 capsule 3   rosuvastatin (CRESTOR) 5 MG tablet Take 1 tablet (5 mg total) by mouth daily. 90 tablet 0   solifenacin (VESICARE) 5 MG tablet Take 5 mg by mouth daily.     tamsulosin (FLOMAX) 0.4 MG CAPS capsule Take 0.8 mg by mouth daily.     No current facility-administered medications for this visit.     Family History    Family History  Problem Relation Age of Onset   Brain cancer Mother    Kidney disease Neg Hx    Prostate cancer Neg Hx    Kidney cancer Neg Hx    Bladder Cancer Neg Hx    He indicated that his mother is deceased. He indicated that his father is deceased. He indicated that the status of his neg hx is unknown.  Social History    Social History   Socioeconomic History   Marital status: Married    Spouse name: Malachy Mood    Number of children: 2   Years of education: Not on file   Highest education level: High school graduate  Occupational History   Occupation: retired   Tobacco Use   Smoking status: Never   Smokeless tobacco: Never  Vaping Use   Vaping Use: Never used  Substance and Sexual Activity   Alcohol use: No   Drug use: No    Sexual activity: Not on file  Other Topics Concern   Not on file  Social History Narrative   MarriedGets regular exercise.      Lives in [place] with wife/ daughter. Never smoked; no alcohol. 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    Social Determinants of Health   Financial Resource Strain: Low Risk  (09/12/2021)  Overall Financial Resource Strain (CARDIA)    Difficulty of Paying Living Expenses: Not hard at all  Food Insecurity: No Food Insecurity (09/12/2021)   Hunger Vital Sign    Worried About Running Out of Food in the Last Year: Never true    Ran Out of Food in the Last Year: Never true  Transportation Needs: No Transportation Needs (09/12/2021)   PRAPARE - Hydrologist (Medical): No    Lack of Transportation (Non-Medical): No  Physical Activity: Inactive (09/23/2021)   Exercise Vital Sign    Days of Exercise per Week: 0 days    Minutes of Exercise per Session: 0 min  Stress: No Stress Concern Present (09/12/2021)   [place]    Feeling of Stress : Only a little  Social Connections: Moderately Integrated (09/12/2021)   Social Connection and Isolation Panel [NHANES]    Frequency of Communication with Friends and Family: Three times a week    Frequency of Social Gatherings with Friends and Family: Twice a week    Attends Religious Services: More than 4 times per year    Active Member of [employer] or Organizations: No    Attends Archivist Meetings: Never    Marital Status: Married  Human resources officer Violence: Not At Risk (09/12/2021)   Humiliation, Afraid, Rape, and Kick questionnaire    Fear of Current or Ex-Partner: No    Emotionally Abused: No    Physically Abused: No    Sexually Abused: No     Review of Systems    General:  No chills, fever, night sweats or weight changes.  Endorses fatigue Cardiovascular:  No chest pain,  endorses chronic dyspnea on exertion, edema, orthopnea, endorses occasional palpitations, paroxysmal nocturnal dyspnea. Dermatological: No rash, lesions/masses Respiratory: No cough, endorses chronic dyspnea Urologic: No hematuria, dysuria Abdominal:   No nausea, vomiting, diarrhea, bright red blood per rectum, melena, or hematemesis Neurologic:  No visual changes, wkns, changes in mental status. All other systems reviewed and are otherwise negative except as noted above.   Physical Exam    VS:  BP (!) 160/80 (BP Location: Left Arm, Patient Position: Sitting, Cuff Size: Normal)   Pulse 65   Ht '6\' 1"'$  (1.854 m)   Wt 187 lb 9.6 oz (85.1 kg)   SpO2 98%   BMI 24.75 kg/m  , BMI Body mass index is 24.75 kg/m.     Vitals:   01/10/22 0215 01/10/22 1400  BP: (!) 158/94 (!) 160/80    GEN: Well nourished, well developed, in no acute distress. HEENT: normal.  Glasses on Neck: Supple, no JVD, carotid bruits, or masses. Cardiac: RRR, no murmurs, rubs, or gallops. No clubbing, cyanosis, edema.  Radials/DP/PT 2+ and equal bilaterally.  Respiratory:  Respirations regular and unlabored, clear to auscultation bilaterally. GI: Soft, nontender, nondistended, BS + x 4. MS: no deformity or atrophy. Skin: warm and dry, no rash. Neuro:  Strength and sensation are intact. Psych: Normal affect.  Accessory Clinical Findings    ECG personally reviewed by me today-sinus rhythm with first-degree AV block with a rate of 65, large quantity of artifacts noted on EKG- No acute changes  Lab Results  Component Value Date   WBC 5.5 10/07/2021   HGB 11.5 (L) 10/07/2021   HCT 33.9 (L) 10/07/2021   MCV 91.1 10/07/2021   PLT 175 10/07/2021   Lab Results  Component Value Date   CREATININE 1.22 01/02/2022   BUN  17 01/02/2022   NA 138 01/02/2022   K 4.1 01/02/2022   CL 101 01/02/2022   CO2 25 01/02/2022   Lab Results  Component Value Date   ALT 11 01/02/2022   AST 16 01/02/2022   ALKPHOS 66 01/02/2022    BILITOT 0.4 01/02/2022   Lab Results  Component Value Date   CHOL 103 01/02/2022   HDL 43 01/02/2022   LDLCALC 44 01/02/2022   TRIG 75 01/02/2022   CHOLHDL 2.6 12/21/2020    Lab Results  Component Value Date   HGBA1C 6.5 (H) 01/02/2022    Assessment & Plan   1.  Essential hypertension with blood pressure today 160/80 repeat pressure 158/94.  He has been continuing to monitor his blood pressures at home and his referral.  Along today unfortunately all of his blood pressures on his log or prior to any medication administration.  His PCP just recently increased his benazepril to 10 mg daily to 20 mg daily.  He has noted some improvement of systolic pressures in the 170s 180s to the 140s to 170s.  At this point today he has been advised to continue to monitor his blood pressures at home but taken them 1 to 2 hours after he takes his medication once, start him on amlodipine 5 mg at bedtime for his exceedingly elevated pressures that he has in the nights when it was 170s of 180s.  2.  Coronary artery disease native coronary artery status post myocardial infarction with stent placement to the right coronary artery in 12/2018.  He continues on clopidogrel and apixaban 5 mg twice daily and lieu of aspirin, imdur,  and rosuvastatin.  He denies any anginal or anginal equivalents at this point.  There are no ischemic changes noted on his EKG.  He had previously been advised that he can stop his clopidogrel in November and continue apixaban to minimize risk of bleeding and we will revisit this at his return appointment as well.  3.  Paroxysmal atrial fibrillation status post patient x2.  Remains in sinus rhythm today.  EKG reveals rate of 65 with a first-degree AV block.  He is continued on Toprol 25 mg daily and apixaban 5 mg twice daily.  4.  Hyperlipidemia with LDL remains at goal.  He is continued on rosuvastatin.  5.  Chronic dyspnea on exertion that was recently diagnosed with COPD Gold stage  IIc.  He continues to be off of inhalers.  Has recently followed up with pulmonary.  States that his shortness of breath he thinks has improved and largely was related to lack of activity and deconditioning.  He has been increasing his activity and has noted that his dyspnea on exertion and shortness of breath has been experiencing has slowly improved and his stamina has improved as well.  He has been encouraged to continue to maintain all follow-ups with pulmonary and if further changes noted in his breathing he may need to reconsider inhaler therapy.  6.  Disposition patient return to clinic to see MD/APP in 1 month to reevaluate blood pressure after starting new medication regimen.  Carigan Lister, NP 01/10/2022, 2:26 PM

## 2022-01-20 ENCOUNTER — Telehealth: Payer: Medicare Other

## 2022-01-20 ENCOUNTER — Other Ambulatory Visit: Payer: Self-pay | Admitting: Nurse Practitioner

## 2022-01-20 ENCOUNTER — Ambulatory Visit (INDEPENDENT_AMBULATORY_CARE_PROVIDER_SITE_OTHER): Payer: Medicare Other

## 2022-01-20 DIAGNOSIS — E1129 Type 2 diabetes mellitus with other diabetic kidney complication: Secondary | ICD-10-CM

## 2022-01-20 DIAGNOSIS — N1831 Chronic kidney disease, stage 3a: Secondary | ICD-10-CM

## 2022-01-20 DIAGNOSIS — E1159 Type 2 diabetes mellitus with other circulatory complications: Secondary | ICD-10-CM

## 2022-01-20 DIAGNOSIS — E1143 Type 2 diabetes mellitus with diabetic autonomic (poly)neuropathy: Secondary | ICD-10-CM

## 2022-01-20 NOTE — Patient Instructions (Addendum)
Please call the care guide team at [phone] if you need to cancel or reschedule your appointment.   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 The patient states that he has started to go to the gym 3 times a week with his wife to see if he can build up some muscle and strength in his legs. He easily gets tired when ambulating and sometime more short of breath. He is pacing his activity. Does not use inhalers for his COPD due to the side effects of the inhalers. The patient states that he is  hoping by going to the gym he will see a positive difference. He and his wife just started this so he has not seen an improvement yet. Education and support given ; Reviewed medications with patient and discussed importance of medication adherence;        Reviewed prescribed diet with patient heart healthy/ADA diet ; Counseled on  importance of regular laboratory monitoring as prescribed;        Discussed plans with patient for ongoing care management follow up and provided patient with direct contact information for care management team. Is appreciative of the CCM team calling and checking on him and helping him with his health and well being;      Provided patient with written educational materials related to hypo and hyperglycemia and importance of correct treatment;       Reviewed scheduled/upcoming provider appointments including: 04-04-2021 at 11 am;         Advised patient, providing education and rationale, to check cbg twice daily and when you have symptoms of low or high blood sugar and record        call provider for findings outside established parameters;       Review of patient status, including review of consultants reports, relevant laboratory and other test results, and medications completed;       Advised patient to discuss changes in her DM health and well being with provider;      Screening for signs and symptoms of depression related to chronic disease state;        Assessed social determinant of health barriers;         Symptom Management: Take medications as prescribed   Attend all scheduled provider appointments Call provider office for new concerns or questions  call the  Suicide and Crisis Lifeline: 988 call the [place] National Suicide Prevention Lifeline: 4846033925 or TTY: 380-127-7477 TTY 8188246074) to talk to a trained counselor call 1-800-273-TALK (toll free, 24 hour hotline) if experiencing a Mental Health or [place]  check feet daily for cuts, sores or redness trim toenails straight across manage portion size wash and dry feet carefully every day wear comfortable, cotton socks wear comfortable, well-fitting shoes  Follow Up Plan: Telephone follow up appointment with care management team member scheduled for: 04-11-2022 at 230 pm       CCM Expected Outcome:   Monitor, Self-Manage and Reduce Symptoms of: CKD       Current Barriers:  Chronic Disease Management support and education needs related to effective management of CKD  Planned Interventions: Assessed the patient     understanding of chronic kidney disease    Evaluation of current treatment plan related to chronic kidney disease self management and patient's adherence to plan as established by provider      Provided education to patient re: stroke prevention, s/s of heart attack and stroke    Reviewed prescribed diet heart healthy/ADA diet  Reviewed medications with patient and discussed importance of compliance    Advised patient, providing education and rationale, to monitor blood pressure daily and record, calling PCP for findings outside established parameters    Discussed complications of poorly controlled blood pressure such as heart disease, stroke, circulatory complications, vision complications, kidney impairment, sexual dysfunction    Reviewed scheduled/upcoming provider appointments including: 04-04-2022 at 11 am   Advised patient to discuss changes in his kidney function, or questions and concerns related to CKD with provider    Discussed plans with patient for ongoing care management follow up and provided patient with direct contact information for care management team    Screening for signs and symptoms of depression related to chronic disease state      Discussed the impact of chronic kidney disease on daily life and mental health and acknowledged and normalized feelings of disempowerment, fear, and frustration    Assessed social determinant of health barriers    Provided education on kidney disease progression    Engage patient in early, proactive and ongoing discussion about goals of care and what matters most to them    Support coping and stress management by recognizing current strategies and assist in developing new strategies such as mindfulness, journaling, relaxation  techniques, problem-solving     Symptom Management: Take medications as prescribed   Attend all scheduled provider appointments Call provider office for new concerns or questions  call the Suicide and Crisis Lifeline: 988 call the [place] National Suicide Prevention Lifeline: (705)808-4753 or TTY: 223-248-2949 TTY 613-194-7784) to talk to a trained counselor call 1-800-273-TALK (toll free, 24 hour hotline) if experiencing a Mental Health or [place]   Follow Up Plan: Telephone follow up appointment with care management team member scheduled for: 04-11-2022 at 230 pm       CCM Expected Outcome:  Monitor, Self-Manage, and Reduce Symptoms of Hypertension       Current Barriers:  Knowledge Deficits related to fluctuations in blood pressures and the need to have normalized blood pressures to prevent the risk of heart attack and stroke Chronic Disease Management support and education needs related to effective management of HTN BP Readings from Last 3 Encounters:  01/10/22 (!) 160/80  01/02/22 138/78  12/06/21 (!) 148/84     Planned Interventions: Evaluation of current treatment plan related to hypertension self  management and patient's adherence to plan as established by provider. The patient saw the cardiologist recently and was started on additional medications to help with his blood pressures being elevated. He says they have come down since he started the other medications. Likes this cardiologist and feels like they have really helped him. He says he is noticing some PVCs more than usual. Education and support given;   Provided education to patient re: stroke prevention, s/s of heart attack and stroke; Reviewed prescribed diet heart healthy/ADA diet  Reviewed medications with patient and discussed importance of compliance. The patient states compliance with medications.;  Discussed plans with patient for ongoing care management follow up and provided patient with direct  contact information for care management team; Advised patient, providing education and rationale, to monitor blood pressure daily and record, calling PCP for findings outside established parameters. The patient states that his blood pressures have been better since the addition of the other medication.  He states today it was 140/73. Review of goal of systolic pressure <676 and diastolic pressure <19. The patient will follow up with the cardiologist later this month;  Reviewed scheduled/upcoming provider appointments including: 02-09-2022 with cardiologist and 04-04-2022 with pcp Advised patient to discuss changes in his HTN, or heart health  with provider; Provided education on prescribed diet Heart healthy/ADA diet ;  Discussed complications of poorly controlled blood pressure such as heart disease, stroke, circulatory complications, vision complications, kidney impairment, sexual dysfunction;  Screening for signs and symptoms of depression related to chronic disease state;  Assessed social determinant of health barriers;   Symptom Management: Take medications as prescribed   Attend all scheduled provider appointments Call provider office for new concerns or questions  call the Suicide and Crisis Lifeline: 988 call the [place] National Suicide Prevention Lifeline: (225) 292-8106 or TTY: 205-842-2636 TTY 343-603-1101) to talk to a trained counselor call 1-800-273-TALK (toll free, 24 hour hotline) if experiencing a Mental Health or [place]  check blood pressure 3 times per week learn about high blood pressure keep a blood pressure log take blood pressure log to all doctor appointments call doctor for signs and symptoms of high blood pressure develop an action plan for high blood pressure keep all doctor appointments take medications for blood pressure exactly as prescribed report new symptoms to your doctor  Follow Up Plan: Telephone follow up appointment with care management  team member scheduled for: 04-11-2022 at 82 pm       COMPLETED: RNCM: Effective Management of COPD       Care Coordination Interventions:See new plan of care  Provided patient with basic written and verbal COPD education on self care/management/and exacerbation prevention Advised patient to track and manage COPD triggers Provided written and verbal instructions on pursed lip breathing and utilized returned demonstration as teach back Provided instruction about proper use of medications used for management of COPD including inhalers Advised patient to self assesses COPD action plan zone and make appointment with provider if in the yellow zone for 48 hours without improvement Advised patient to engage in light exercise as tolerated 3-5 days a week to aid in the the management of COPD Provided education about and advised patient to utilize infection prevention strategies to reduce risk of respiratory infection Discussed the importance of adequate rest and management of fatigue with COPD        COMPLETED: RNCM: Effective Management of Heart Health       Care Coordination Interventions: See new plan of care BP Readings  from Last 3 Encounters:  11/15/21 118/71  11/14/21 135/82  10/31/21 138/71    Provided education on importance of blood pressure control in management of CAD. The patient states he is feeling much better since seeing his new cardiologist and adjustments in medications and also getting his CPAP machine. The patient states that he over all is not as tired and can tell a positive difference Provided education on Importance of limiting foods high in cholesterol.  Counseled on importance of regular laboratory monitoring as prescribed. 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 Limited in his ability to do exercises due to back pain and discomfort. Education on staying active and pacing activity. Patient is currently assisting  his wife who recently had knee replacement surgery Reviewed Importance of taking all medications as prescribed. No new needs related to medications Reviewed Importance of attending all scheduled provider appointments. 01-02-2022 with pcp. Sees cardiologist on a regular basis Advised to report any changes in symptoms or exercise tolerance Advised patient to discuss changes in the way he feels, increase shortness of breath, questions or concerns with provider Screening for signs and symptoms of depression related to chronic disease state Assessed social determinant of health barriers AWV completed on 09-12-2021      COMPLETED: RNCM: Effective Management of Pain       Care Coordination Interventions:See new plan of care    Reviewed provider established plan for pain management. The patient feels his pain in his back and legs are getting worse. He denies falls.  He states he continues to use his crutch when ambulating. Review of safety and fall risk. Discussed importance of adherence to all scheduled medical appointments. Sees pcp on 01-02-2022 Counseled on the importance of reporting any/all new or changed pain symptoms or management strategies to pain management provider Advised patient to report to care team affect of pain on daily activities Discussed use of relaxation techniques and/or diversional activities to assist with pain reduction (distraction, imagery, relaxation, massage, acupressure, TENS, heat, and cold application Reviewed with patient prescribed pharmacological and nonpharmacological pain relief strategies. The patient is compliant with his medications and he also has a nerve stimulator that he adjust accordingly. The patient has a representative that helps him with settings as needed.  Advised patient to discuss changes in level or intensity of pain or unresolved pain with provider Has a pain stimulator for back pain and discomfort. Can adjust settings as needed for pain relief            Patient verbalizes understanding of instructions and care plan provided today and agrees to view in [place]. Active MyChart status and patient understanding of how to access instructions and care plan via MyChart confirmed with patient.     Telephone follow up appointment with care management team member scheduled for:  04-11-2022 at 230 pm  Managing Your High Blood Pressure For the person with high blood pressure, learn what blood pressure is, what the numbers mean, and what you can do to help keep your blood pressure in a normal range.  To view the content, go to this web address: [email or web address]  This video will expire on: 10/19/2023. If you need access to this video following this date, please reach out to the healthcare provider who assigned it to you. This information is not intended to replace advice given to you by your health care provider. Make sure you discuss any questions you have with your health  care provider. Elsevier Patient Education  [place].   Blood Pressure Record Sheet To take your blood pressure, you will need a blood pressure machine. You may be prescribed one, or you can buy a blood pressure machine (blood pressure monitor) at your clinic, drug store, or online. When choosing one, look for these features: An automatic monitor that has an arm cuff. A cuff that wraps snugly, but not too tightly, around your upper arm. You should be able to fit only one finger between your arm and the cuff. A device that stores blood pressure reading results. Do not choose a monitor that measures your blood pressure from your wrist or finger. Follow your health care provider's instructions for how to take your blood pressure. To use this form: Get one reading in the morning (a.m.) before you take any medicines. Get one reading in the evening (p.m.) before supper. Take at least two readings with each blood pressure check. This makes sure the results  are correct. Wait 1-2 minutes between measurements. Write down the results in the spaces on this form. Repeat this once a week, or as told by your health care provider. Make a follow-up appointment with your health care provider to discuss the results. Blood pressure log Date: _______________________ a.m. _____________________(1st reading) _____________________(2nd reading) p.m. _____________________(1st reading) _____________________(2nd reading) Date: _______________________ a.m. _____________________(1st reading) _____________________(2nd reading) p.m. _____________________(1st reading) _____________________(2nd reading) Date: _______________________ a.m. _____________________(1st reading) _____________________(2nd reading) p.m. _____________________(1st reading) _____________________(2nd reading) Date: _______________________ a.m. _____________________(1st reading) _____________________(2nd reading) p.m. _____________________(1st reading) _____________________(2nd reading) Date: _______________________ a.m. _____________________(1st reading) _____________________(2nd reading) p.m. _____________________(1st reading) _____________________(2nd reading) This information is not intended to replace advice given to you by your health care provider. Make sure you discuss any questions you have with your health care provider. Document Revised: 10/14/2020 Document Reviewed: 10/14/2020 Elsevier Patient Education  [place].

## 2022-01-20 NOTE — Plan of Care (Signed)
Chronic Care Management Provider Comprehensive Care Plan    01/20/2022 Name: Gen Clagg. MRN: [id number] DOB: September 20, 1940  Referral to Chronic Care Management (CCM) services was placed by Provider:  Marnee Guarneri, NP on Date: 01-20-2022.  Chronic Condition 1: DM Provider Assessment and Plan  Chronic, ongoing with A1c 6.5% today and urine ALB 80 (February 2023).  Significant decreased sensation bilateral feet, monitor closely for wounds and falls.  Continue current diabetes medication regimen and adjust as needed. He did not get benefit from Lyrica or Gabapentin, will continue collaboration with neurology and pain clinic.  Continue to monitor BS daily and document for visits.         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  Continues to have stable BS control.  Will continue current medication regimen and adjust as needed.  Recommend he check BS at least 3 times daily.  Continue Benazepril for kidney protection.        Relevant Orders    Bayer DCA Hb A1c Waived (Completed)     Expected Outcome/Goals Addressed This Visit (Provider CCM goals/Provider Assessment and plan   CCM (Diabetes)  EXPECTED OUTCOME:  MONITOR,SELF- MANAGE AND REDUCE SYMPTOMS OF Diabetes    Symptom Management Condition 1: Take all medications as prescribed Attend all scheduled provider appointments Call provider office for new concerns or questions  call the Suicide and Crisis Lifeline: 988 call the [place] National Suicide Prevention Lifeline: (717)450-5694 or TTY: 270-752-6092 TTY 602 164 2664) to talk to a trained counselor call 1-800-273-TALK (toll free, 24 hour hotline) if experiencing a Mental Health or [place]  check feet daily for cuts, sores or redness trim toenails straight across manage portion size wash and dry feet carefully every  day wear comfortable, cotton socks wear comfortable, well-fitting shoes  Chronic Condition 2: HTN Provider Assessment and Plan  Chronic, ongoing -- above goal initial checks and at home.  A1c today 6.5%.  Continue Metformin and current cardiac medications, with exception of recommend he increase Benazepril to full tablet = 20 MG due to ongoing BP elevations.  Will continue collaboration with cardiology, appreciate their input.  Recommend he continue to check BP at home at least 3 mornings a week and document.  DASH diet.  LABS: CMP.         Relevant Orders    Bayer DCA Hb A1c Waived (Completed)    Comprehensive metabolic panel     Expected Outcome/Goals Addressed This Visit (Provider CCM goals/Provider Assessment and plan   CCM (HYPERTENSION)  EXPECTED OUTCOME:  MONITOR,SELF- MANAGE AND REDUCE SYMPTOMS OF HYPERTENSION   Symptom Management Condition 2: Take all medications as prescribed Attend all scheduled provider appointments Call provider office for new concerns or questions  call the Suicide and Crisis Lifeline: 988 call the [place] National Suicide Prevention Lifeline: 434-550-0073 or TTY: 5675406902 TTY 513-512-8501) to talk to a trained counselor call 1-800-273-TALK (toll free, 24 hour hotline) if experiencing a Mental Health or [place]  check blood pressure 3 times per week write blood pressure results in a log or diary learn about high blood pressure keep a blood pressure log take blood pressure log to all doctor appointments call doctor for signs and symptoms of high blood pressure keep all doctor appointments take medications for blood pressure exactly as prescribed report new symptoms  to your doctor  Chronic Condition 3: CKD Provider Assessment and Plan  Chronic, ongoing.  Continue to monitor closely and refer to nephrology if decline.  Continue Benazepril for kidney protection and proteinuria.  Renal dose medications as needed based on labs.            Relevant Orders    Comprehensive metabolic panel         Expected Outcome/Goals Addressed This Visit (Provider CCM goals/Provider Assessment and plan   CCM (CKD)  EXPECTED OUTCOME:  MONITOR,SELF- MANAGE AND REDUCE SYMPTOMS OF CKD   Symptom Management Condition 3: Take all medications as prescribed Attend all scheduled provider appointments Call provider office for new concerns or questions  call the Suicide and Crisis Lifeline: 988 call the [place] National Suicide Prevention Lifeline: 226-243-3566 or TTY: (743)627-9205 TTY 208-185-1505) to talk to a trained counselor call 1-800-273-TALK (toll free, 24 hour hotline) if experiencing a Mental Health or [place] Crisis    Problem List Patient Active Problem List   Diagnosis Date Noted   COPD (chronic obstructive pulmonary disease) ([place]) 10/31/2021   Shortness of breath on exertion 08/04/2021   Diabetes mellitus with proteinuria ([place]) 04/04/2021   GERD without esophagitis 04/02/2021   Coronary artery disease 12/28/2020   History of non-ST elevation myocardial infarction (NSTEMI) 12/21/2020   Pain in right shin 10/25/2020   Peripheral vascular disease ([place]) 08/06/2020   Chronic pain syndrome 07/06/2020   Cervical facet joint syndrome 04/08/2020   Spinal cord stimulator status 12/11/2019   Elevated TSH 08/20/2019   CKD (chronic kidney disease) stage 3, GFR 30-59 ml/min ([place]) 01/19/2019   Acquired thrombophilia ([place]) 01/19/2019   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   Postlaminectomy syndrome, lumbar region 01/16/2019   History of fusion of lumbar spine (L2-L5) 01/16/2019   Chronic radicular lumbar pain 01/16/2019   HNP (herniated nucleus pulposus), lumbar 04/29/2018   Advanced care planning/counseling discussion 09/28/2016   Spinal stenosis, lumbar region, 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/13/2016   Hyperlipidemia associated with type 2 diabetes mellitus ([place]) 07/14/2015   Symptomatic anemia 06/30/2015    Benign prostatic hyperplasia without lower urinary tract symptoms 06/02/2015   OSA (obstructive sleep apnea) 03/23/2015   Hypertension associated with diabetes ([place]) 09/28/2014   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[place]) 09/28/2014   H/O prior ablation treatment 10/19/2011   AF (paroxysmal atrial fibrillation) ([place]) 10/19/2011    Medication Management  Current Outpatient Medications:    amLODipine (NORVASC) 5 MG tablet, Take 1 tablet (5 mg total) by mouth at bedtime., Disp: 90 tablet, Rfl: 3   benazepril (LOTENSIN) 20 MG tablet, Take 1 tablet (20 mg total) by mouth daily., Disp: 90 tablet, Rfl: 3   clopidogrel (PLAVIX) 75 MG tablet, Take 75 mg by mouth daily., Disp: , Rfl:    cyanocobalamin 1000 MCG tablet, Take by mouth., Disp: , Rfl:    ELIQUIS 5 MG TABS tablet, Take 1 tablet (5 mg total) by mouth 2 (two) times daily., Disp: 180 tablet, Rfl: 1   ferrous sulfate 325 (65 FE) MG EC tablet, Take 325 mg by mouth daily with breakfast., Disp: , Rfl:    finasteride (PROSCAR) 5 MG tablet, Take 1 tablet (5 mg total) by mouth daily., Disp: 90 tablet, Rfl: 2   isosorbide mononitrate (IMDUR) 30 MG 24 hr tablet, Take 30 mg by mouth daily., Disp: , Rfl:    metFORMIN (GLUCOPHAGE) 500 MG tablet, Take 1 tablet (500 mg total) by mouth 2 (two) times daily with a meal., Disp: 180 tablet,  Rfl: 4   metoprolol succinate (TOPROL XL) 25 MG 24 hr tablet, Take 1 tablet (25 mg total) by mouth daily., Disp: 30 tablet, Rfl: 5   omeprazole (PRILOSEC) 40 MG capsule, Take 1 capsule (40 mg total) by mouth in the morning and at bedtime., Disp: 180 capsule, Rfl: 3   rosuvastatin (CRESTOR) 5 MG tablet, Take 1 tablet (5 mg total) by mouth daily., Disp: 90 tablet, Rfl: 0   solifenacin (VESICARE) 5 MG tablet, Take 5 mg by mouth daily., Disp: , Rfl:    tamsulosin (FLOMAX) 0.4 MG CAPS capsule, Take 0.8 mg by mouth daily., Disp: , Rfl:   Cognitive Assessment Identity Confirmed: : Name; DOB Cognitive Status:  Normal   Functional Assessment Hearing Difficulty or Deaf: no Wear Glasses or Blind: yes Vision Management: the patient wears prescription glasses Concentrating, Remembering or Making Decisions Difficulty (CP): no Difficulty Communicating: no Difficulty Eating/Swallowing: no Walking or Climbing Stairs Difficulty: yes Walking or Climbing Stairs: ambulation difficulty, requires equipment Mobility Management: the patient has weakness in his legs and chronic back pain with a nerve stimulator. The uses a crutch when ambulating. High fall risk. Dressing/Bathing Difficulty: no Doing Errands Independently Difficulty (such as shopping) (CP): no Change in Functional Status Since Onset of Current Illness/Injury: no   Caregiver Assessment  Primary Source of Support/Comfort: spouse Name of Support/Comfort Primary Source: Darrek Leasure People in Home: child(ren), adult; spouse Name(s) of People in Home: Malachy Mood his wife and Lavella Lemons his daughter live with the patient Family Caregiver if Needed: child(ren), adult; spouse Family Caregiver Names: Malachy Mood and Air traffic controller Primary Roles/Responsibilities: retired   Planned Interventions  Provided education to patient about basic DM disease process. 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 The patient states that he has started to go to the gym 3 times a week with his wife to see if he can build up some muscle and strength in his legs. He easily gets tired when ambulating and sometime more short of breath. He is pacing his activity. Does not use inhalers for his COPD due to the side effects of the inhalers. The patient states that he is  hoping by going to the gym he will see a positive difference. He and his wife just started this so he has not seen an improvement yet. Education and support given ; Reviewed medications with patient and discussed importance of medication  adherence;        Reviewed prescribed diet with patient heart healthy/ADA diet ; Counseled on importance of regular laboratory monitoring as prescribed;        Discussed plans with patient for ongoing care management follow up and provided patient with direct contact information for care management team. Is appreciative of the CCM team calling and checking on him and helping him with his health and well being;      Provided patient with written educational materials related to hypo and hyperglycemia and importance of correct treatment;       Reviewed scheduled/upcoming provider appointments including: 04-04-2021 at 11 am;         Advised patient, providing education and rationale, to check cbg twice daily and when you have symptoms of low or high blood sugar and record        call provider for findings outside established parameters;       Review of patient status, including review of consultants reports, relevant laboratory  and other test results, and medications completed;       Advised patient to discuss changes in her DM health and well being with provider;      Screening for signs and symptoms of depression related to chronic disease state;        Assessed social determinant of health barriers;       Assessed the patient     understanding of chronic kidney disease    Evaluation of current treatment plan related to chronic kidney disease self management and patient's adherence to plan as established by provider      Provided education to patient re: stroke prevention, s/s of heart attack and stroke    Reviewed prescribed diet heart healthy/ADA diet  Reviewed medications with patient and discussed importance of compliance    Advised patient, providing education and rationale, to monitor blood pressure daily and record, calling PCP for findings outside established parameters    Discussed complications of poorly controlled blood pressure such as heart disease, stroke, circulatory complications,  vision complications, kidney impairment, sexual dysfunction    Reviewed scheduled/upcoming provider appointments including: 04-04-2022 at 11 am   Advised patient to discuss changes in his kidney function, or questions and concerns related to CKD with provider    Discussed plans with patient for ongoing care management follow up and provided patient with direct contact information for care management team    Screening for signs and symptoms of depression related to chronic disease state      Discussed the impact of chronic kidney disease on daily life and mental health and acknowledged and normalized feelings of disempowerment, fear, and frustration    Assessed social determinant of health barriers    Provided education on kidney disease progression    Engage patient in early, proactive and ongoing discussion about goals of care and what matters most to them    Support coping and stress management by recognizing current strategies and assist in developing new strategies such as mindfulness, journaling, relaxation techniques, problem-solving    Evaluation of current treatment plan related to hypertension self management and patient's adherence to plan as established by provider. The patient saw the cardiologist recently and was started on additional medications to help with his blood pressures being elevated. He says they have come down since he started the other medications. Likes this cardiologist and feels like they have really helped him. He says he is noticing some PVCs more than usual. Education and support given;   Provided education to patient re: stroke prevention, s/s of heart attack and stroke; Reviewed prescribed diet heart healthy/ADA diet  Reviewed medications with patient and discussed importance of compliance. The patient states compliance with medications.;  Discussed plans with patient for ongoing care management follow up and provided patient with direct contact information for care  management team; Advised patient, providing education and rationale, to monitor blood pressure daily and record, calling PCP for findings outside established parameters. The patient states that his blood pressures have been better since the addition of the other medication.  He states today it was 140/73. Review of goal of systolic pressure <382 and diastolic pressure <50. The patient will follow up with the cardiologist later this month;  Reviewed scheduled/upcoming provider appointments including: 02-09-2022 with cardiologist and 04-04-2022 with pcp Advised patient to discuss changes in his HTN, or heart health  with provider; Provided education on prescribed diet Heart healthy/ADA diet ;  Discussed complications of poorly controlled blood pressure such as heart disease, stroke,  circulatory complications, vision complications, kidney impairment, sexual dysfunction;  Screening for signs and symptoms of depression related to chronic disease state;  Assessed social determinant of health barriers;       Interaction and coordination with outside resources, practitioners, and providers See CCM Referral  Care Plan: Available in MyChart

## 2022-01-20 NOTE — Chronic Care Management (AMB) (Signed)
Chronic Care Management   CCM RN Visit Note  01/20/2022 Name: Sean Reyes. MRN: [id number] DOB: Dec 12, 1940  Subjective: Sean Reyes. is a 81 y.o. year old male who is a primary care patient of [name], [name], NP. The patient was referred to the Chronic Care Management team for assistance with care management needs subsequent to provider initiation of CCM services and plan of care.    Today's Visit:  Engaged with patient by telephone for initial visit.     SDOH Interventions Today    Flowsheet Row Most Recent Value  SDOH Interventions   Food Insecurity Interventions Intervention Not Indicated  Housing Interventions Intervention Not Indicated  Transportation Interventions Intervention Not Indicated  Utilities Interventions Intervention Not Indicated  Alcohol Usage Interventions Intervention Not Indicated (Score <7)  Financial Strain Interventions Intervention Not Indicated  Physical Activity Interventions Other (Comments), Cardiac Rehab  [he and his wife have been going to [employer] 3 days a week for at least 20 to 25 minutes and riding the stationary bikes.]  Stress Interventions Intervention Not Indicated, Other (Comment)  [frustrated with his over all health but keeps pushing himself forward]  Social Connections Interventions Intervention Not Indicated, Other (Comment)  [has a good support system]         Goals Addressed             This Visit's Progress    CCM Expected Outcome:  Monitor, Self-Manage and Reduce Symptoms of Diabetes       Current Barriers:  Knowledge Deficits related to how to effectively manage 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 The patient states that he has started to go to the gym 3 times a week with his wife to see if he can build up some muscle and strength in his legs. He easily gets tired when ambulating and sometime more short of breath. He is pacing his activity. Does not use inhalers for his COPD due to the side effects of the inhalers. The patient states that he is  hoping by going to the gym he will see a positive difference. He and his wife just started this so he has not seen an improvement yet. Education and support given ; Reviewed medications with patient and discussed importance of medication adherence;        Reviewed prescribed diet with patient heart healthy/ADA diet ; Counseled on importance of regular laboratory monitoring as prescribed;        Discussed plans with patient for ongoing care management follow up and provided patient with direct contact information for care management team. Is appreciative of the CCM team calling and checking on him and helping him with his health and well being;      Provided patient with written educational materials related to hypo and hyperglycemia and importance of correct treatment;       Reviewed scheduled/upcoming provider appointments including: 04-04-2021 at 11 am;         Advised patient, providing education and rationale, to  check cbg twice daily and when you have symptoms of low or high blood sugar and record        call provider for findings outside established parameters;       Review of patient status, including review of consultants reports, relevant laboratory and other test results, and medications completed;       Advised patient to discuss changes in her DM health and well being with provider;      Screening for signs and symptoms of depression related to chronic disease  state;        Assessed social determinant of health barriers;         Symptom Management: Take medications as prescribed   Attend all scheduled provider appointments Call provider office for new concerns or questions  call the Suicide and Crisis Lifeline: 988 call the [place] National Suicide Prevention Lifeline: 321-262-2392 or TTY: 905-562-2278 TTY (581)477-8951) to talk to a trained counselor call 1-800-273-TALK (toll free, 24 hour hotline) if experiencing a Mental Health or [place]  check feet daily for cuts, sores or redness trim toenails straight across manage portion size wash and dry feet carefully every day wear comfortable, cotton socks wear comfortable, well-fitting shoes  Follow Up Plan: Telephone follow up appointment with care management team member scheduled for: 04-11-2022 at 230 pm       CCM Expected Outcome:  Monitor, Self-Manage and Reduce Symptoms of: CKD       Current Barriers:  Chronic Disease Management support and education needs related to effective management of CKD  Planned Interventions: Assessed the patient     understanding of chronic kidney disease    Evaluation of current treatment plan related to chronic kidney disease self management and patient's adherence to plan as established by provider      Provided education to patient re: stroke prevention, s/s of heart attack and stroke    Reviewed prescribed diet heart healthy/ADA diet  Reviewed medications with patient and discussed importance of compliance    Advised patient, providing education and rationale, to monitor blood pressure daily and record, calling PCP for findings outside established parameters    Discussed complications of poorly controlled blood pressure such as heart disease, stroke, circulatory complications, vision complications, kidney impairment, sexual dysfunction    Reviewed scheduled/upcoming provider appointments including: 04-04-2022 at 11 am   Advised patient to  discuss changes in his kidney function, or questions and concerns related to CKD with provider    Discussed plans with patient for ongoing care management follow up and provided patient with direct contact information for care management team    Screening for signs and symptoms of depression related to chronic disease state      Discussed the impact of chronic kidney disease on daily life and mental health and acknowledged and normalized feelings of disempowerment, fear, and frustration    Assessed social determinant of health barriers    Provided education on kidney disease progression    Engage patient in early, proactive and ongoing discussion about goals of care and what matters most to them    Support coping and stress management by recognizing current strategies and assist in developing new strategies such as mindfulness, journaling, relaxation techniques, problem-solving     Symptom Management: Take medications as prescribed   Attend all scheduled provider appointments Call provider office for new concerns or questions  call the Suicide and Crisis Lifeline: 988 call the [place] National Suicide Prevention Lifeline: (864) 865-2399 or TTY: 646-626-1173 TTY 530-756-4490) to  talk to a trained counselor call 1-800-273-TALK (toll free, 24 hour hotline) if experiencing a Mental Health or [place]   Follow Up Plan: Telephone follow up appointment with care management team member scheduled for: 04-11-2022 at 230 pm       CCM Expected Outcome:  Monitor, Self-Manage, and Reduce Symptoms of Hypertension       Current Barriers:  Knowledge Deficits related to fluctuations in blood pressures and the need to have normalized blood pressures to prevent the risk of heart attack and stroke Chronic Disease Management support and education needs related to effective management of HTN BP Readings from Last 3 Encounters:  01/10/22 (!) 160/80  01/02/22 138/78  12/06/21 (!) 148/84     Planned  Interventions: Evaluation of current treatment plan related to hypertension self management and patient's adherence to plan as established by provider. The patient saw the cardiologist recently and was started on additional medications to help with his blood pressures being elevated. He says they have come down since he started the other medications. Likes this cardiologist and feels like they have really helped him. He says he is noticing some PVCs more than usual. Education and support given;   Provided education to patient re: stroke prevention, s/s of heart attack and stroke; Reviewed prescribed diet heart healthy/ADA diet  Reviewed medications with patient and discussed importance of compliance. The patient states compliance with medications.;  Discussed plans with patient for ongoing care management follow up and provided patient with direct contact information for care management team; Advised patient, providing education and rationale, to monitor blood pressure daily and record, calling PCP for findings outside established parameters. The patient states that his blood pressures have been better since the addition of the other medication.  He states today it was 140/73. Review of goal of systolic pressure <409 and diastolic pressure <73. The patient will follow up with the cardiologist later this month;  Reviewed scheduled/upcoming provider appointments including: 02-09-2022 with cardiologist and 04-04-2022 with pcp Advised patient to discuss changes in his HTN, or heart health  with provider; Provided education on prescribed diet Heart healthy/ADA diet ;  Discussed complications of poorly controlled blood pressure such as heart disease, stroke, circulatory complications, vision complications, kidney impairment, sexual dysfunction;  Screening for signs and symptoms of depression related to chronic disease state;  Assessed social determinant of health barriers;   Symptom Management: Take  medications as prescribed   Attend all scheduled provider appointments Call provider office for new concerns or questions  call the Suicide and Crisis Lifeline: 988 call the [place] National Suicide Prevention Lifeline: (347)474-2729 or TTY: 848-038-4916 TTY (478)803-3403) to talk to a trained counselor call 1-800-273-TALK (toll free, 24 hour hotline) if experiencing a Mental Health or [place]  check blood pressure 3 times per week learn about high blood pressure keep a blood pressure log take blood pressure log to all doctor appointments call doctor for signs and symptoms of high blood pressure develop an action plan for high blood pressure keep all doctor appointments take medications for blood pressure exactly as prescribed report new symptoms to your doctor  Follow Up Plan: Telephone follow up appointment with care management team member scheduled for: 04-11-2022 at 230 pm       COMPLETED: RNCM: Effective Management of COPD       Care Coordination Interventions:See new plan of care  Provided patient with basic written and verbal COPD education on self care/management/and exacerbation prevention Advised patient to track and  manage COPD triggers Provided written and verbal instructions on pursed lip breathing and utilized returned demonstration as teach back Provided instruction about proper use of medications used for management of COPD including inhalers Advised patient to self assesses COPD action plan zone and make appointment with provider if in the yellow zone for 48 hours without improvement Advised patient to engage in light exercise as tolerated 3-5 days a week to aid in the the management of COPD Provided education about and advised patient to utilize infection prevention strategies to reduce risk of respiratory infection Discussed the importance of adequate rest and management of fatigue with COPD        COMPLETED: RNCM: Effective Management of Heart Health        Care Coordination Interventions: See new plan of care BP Readings from Last 3 Encounters:  11/15/21 118/71  11/14/21 135/82  10/31/21 138/71    Provided education on importance of blood pressure control in management of CAD. The patient states he is feeling much better since seeing his new cardiologist and adjustments in medications and also getting his CPAP machine. The patient states that he over all is not as tired and can tell a positive difference Provided education on Importance of limiting foods high in cholesterol.  Counseled on importance of regular laboratory monitoring as prescribed. 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 Limited in his ability to do exercises due to back pain and discomfort. Education on staying active and pacing activity. Patient is currently assisting his wife who recently had knee replacement surgery Reviewed Importance of taking all medications as prescribed. No new needs related to medications Reviewed Importance of attending all scheduled provider appointments. 01-02-2022 with pcp. Sees cardiologist on a regular basis Advised to report any changes in symptoms or exercise tolerance Advised patient to discuss changes in the way he feels, increase shortness of breath, questions or concerns with provider Screening for signs and symptoms of depression related to chronic disease state Assessed social determinant of health barriers AWV completed on 09-12-2021      COMPLETED: RNCM: Effective Management of Pain       Care Coordination Interventions:See new plan of care    Reviewed provider established plan for pain management. The patient feels his pain in his back and legs are getting worse. He denies falls.  He states he continues to use his crutch when ambulating. Review of safety and fall risk. Discussed importance of adherence to all scheduled medical appointments. Sees pcp on  01-02-2022 Counseled on the importance of reporting any/all new or changed pain symptoms or management strategies to pain management provider Advised patient to report to care team affect of pain on daily activities Discussed use of relaxation techniques and/or diversional activities to assist with pain reduction (distraction, imagery, relaxation, massage, acupressure, TENS, heat, and cold application Reviewed with patient prescribed pharmacological and nonpharmacological pain relief strategies. The patient is compliant with his medications and he also has a nerve stimulator that he adjust accordingly. The patient has a representative that helps him with settings as needed.  Advised patient to discuss changes in level or intensity of pain or unresolved pain with provider Has a pain stimulator for back pain and discomfort. Can adjust settings as needed for pain relief           Plan:Telephone follow up appointment with care management team member scheduled for:  04-11-2022 at 230 pm  Roxie, MSN, CCM  RN Care Manager  Chronic Care Management Direct Number: [phone]

## 2022-01-23 ENCOUNTER — Ambulatory Visit (INDEPENDENT_AMBULATORY_CARE_PROVIDER_SITE_OTHER): Payer: Medicare Other | Admitting: Internal Medicine

## 2022-01-23 VITALS — BP 140/80 | HR 62 | Resp 18 | Ht 73.0 in | Wt 186.0 lb

## 2022-01-23 DIAGNOSIS — E1159 Type 2 diabetes mellitus with other circulatory complications: Secondary | ICD-10-CM

## 2022-01-23 DIAGNOSIS — G4733 Obstructive sleep apnea (adult) (pediatric): Secondary | ICD-10-CM

## 2022-01-23 DIAGNOSIS — I152 Hypertension secondary to endocrine disorders: Secondary | ICD-10-CM

## 2022-01-23 DIAGNOSIS — Z7189 Other specified counseling: Secondary | ICD-10-CM | POA: Diagnosis not present

## 2022-01-23 NOTE — Patient Instructions (Signed)

## 2022-01-23 NOTE — Progress Notes (Signed)
[facility] [place], [place] [address]  Pulmonary Sleep Medicine   Office Visit Note  Patient Name: Sean Reyes. DOB: Jul 28, 1940 MRN [id number]    Chief Complaint: Obstructive Sleep Apnea visit  Brief History:  Sean Reyes is seen today for a 2 month follow up on APAP at 5-15 cmh20. The patient has a 13 year history of sleep apnea. Patient is using PAP nightly.  The patient feels rested after sleeping with PAP.  The patient reports benefiting from PAP use. Reported sleepiness is  improved and the Epworth Sleepiness Score is 9 out of 24. The patient sometimes take naps. The patient complains of the following: Mask leak recently.  The compliance download shows 97% compliance with an average use time of 6 hours 51 minutes. The AHI is 5.2. The patient does not complain of limb movements disrupting sleep.  ROS  General: (-) fever, (-) chills, (-) night sweat Nose and Sinuses: (-) nasal stuffiness or itchiness, (-) postnasal drip, (-) nosebleeds, (-) sinus trouble. Mouth and Throat: (-) sore throat, (-) hoarseness. Neck: (-) swollen glands, (-) enlarged thyroid, (-) neck pain. Respiratory: - cough, - shortness of breath, - wheezing. Neurologic: - numbness, - tingling. Psychiatric: - anxiety, - depression   Current Medication: Outpatient Encounter Medications as of 01/23/2022  Medication Sig   amLODipine (NORVASC) 5 MG tablet Take 1 tablet (5 mg total) by mouth at bedtime.   benazepril (LOTENSIN) 20 MG tablet Take 1 tablet (20 mg total) by mouth daily.   clopidogrel (PLAVIX) 75 MG tablet Take 75 mg by mouth daily.   cyanocobalamin 1000 MCG tablet Take by mouth.   ELIQUIS 5 MG TABS tablet Take 1 tablet (5 mg total) by mouth 2 (two) times daily.   ferrous sulfate 325 (65 FE) MG EC tablet Take 325 mg by mouth daily with breakfast.   finasteride (PROSCAR) 5 MG tablet Take 1 tablet (5 mg total) by mouth daily.   isosorbide mononitrate (IMDUR) 30 MG 24 hr tablet  Take 30 mg by mouth daily.   metFORMIN (GLUCOPHAGE) 500 MG tablet Take 1 tablet (500 mg total) by mouth 2 (two) times daily with a meal.   metoprolol succinate (TOPROL XL) 25 MG 24 hr tablet Take 1 tablet (25 mg total) by mouth daily.   omeprazole (PRILOSEC) 40 MG capsule Take 1 capsule (40 mg total) by mouth in the morning and at bedtime.   rosuvastatin (CRESTOR) 5 MG tablet Take 1 tablet (5 mg total) by mouth daily.   solifenacin (VESICARE) 5 MG tablet Take 5 mg by mouth daily.   tamsulosin (FLOMAX) 0.4 MG CAPS capsule Take 0.8 mg by mouth daily.   No facility-administered encounter medications on file as of 01/23/2022.    Surgical History: Past Surgical History:  Procedure Laterality Date   ABLATION     ANTERIOR LAT LUMBAR FUSION N/A 06/27/2017   Procedure: Anterior Lateral Lumbar Interbody  Fusion - Lumbar Two-Lumbar Three - Lumbar Three-Lumbar Four, Posterior Lumbar Interbody Fusion Lumbar Four- Five;  Surgeon: Kary Kos, MD;  Location: [place];  Service: Neurosurgery;  Laterality: N/A;  Anterior Lateral Lumbar Interbody  Fusion - Lumbar Two-Lumbar Three - Lumbar Three-Lumbar Four, Posterior Lumbar Interbody Fusion Lumbar Four- Five   BACK SURGERY     CARDIAC CATHETERIZATION     CARDIOVERSION N/A 08/29/2018   Procedure: CARDIOVERSION;  Surgeon: Corey Skains, MD;  Location: [facility];  Service: Cardiovascular;  Laterality: N/A;   CARDIOVERSION N/A 09/24/2018   Procedure: CARDIOVERSION;  Surgeon: Corey Skains, MD;  Location: [facility];  Service: Cardiovascular;  Laterality: N/A;   COLONOSCOPY WITH PROPOFOL N/A 10/05/2015   Procedure: COLONOSCOPY WITH PROPOFOL;  Surgeon: Lollie Sails, MD;  Location: [facility] ENDOSCOPY;  Service: Endoscopy;  Laterality: N/A;   COLONOSCOPY WITH PROPOFOL N/A 11/01/2020   Procedure: COLONOSCOPY WITH PROPOFOL;  Surgeon: Jonathon Bellows, MD;  Location: [facility] ENDOSCOPY;  Service: Gastroenterology;  Laterality: N/A;   CORONARY STENT INTERVENTION N/A  12/22/2020   Procedure: CORONARY STENT INTERVENTION;  Surgeon: [name], MD;  Location: [place] [facility];  Service: Cardiovascular;  Laterality: N/A;   ESOPHAGOGASTRODUODENOSCOPY N/A 11/01/2020   Procedure: ESOPHAGOGASTRODUODENOSCOPY (EGD);  Surgeon: Jonathon Bellows, MD;  Location: [facility] ENDOSCOPY;  Service: Gastroenterology;  Laterality: N/A;   ESOPHAGOGASTRODUODENOSCOPY (EGD) WITH PROPOFOL N/A 04/01/2018   Procedure: ESOPHAGOGASTRODUODENOSCOPY (EGD) WITH PROPOFOL;  Surgeon: Lollie Sails, MD;  Location: [facility] ENDOSCOPY;  Service: Endoscopy;  Laterality: N/A;   HERNIA REPAIR     JOINT REPLACEMENT Bilateral    hips  RT+  LEFT X2    LEFT HEART CATH AND CORONARY ANGIOGRAPHY N/A 12/22/2020   Procedure: LEFT HEART CATH AND CORONARY ANGIOGRAPHY;  Surgeon: [name], MD;  Location: [place] [facility];  Service: Cardiovascular;  Laterality: N/A;   LEFT HEART CATH AND CORONARY ANGIOGRAPHY Left 08/23/2021   Procedure: LEFT HEART CATH AND CORONARY ANGIOGRAPHY;  Surgeon: Corey Skains, MD;  Location: [place] [facility];  Service: Cardiovascular;  Laterality: Left;   LUMBAR LAMINECTOMY/DECOMPRESSION MICRODISCECTOMY Left 09/13/2016   Procedure: Microdiscectomy - Lumbar two-three,  Lumbar three- - left;  Surgeon: Kary Kos, MD;  Location: [place];  Service: Neurosurgery;  Laterality: Left;   SPINAL CORD STIMULATOR INSERTION  07/08/2019   TONSILLECTOMY      Medical History: Past Medical History:  Diagnosis Date   Anemia    Anxiety    Arthritis    Arthritis of neck    Atrial fibrillation (HCC)    Cataracts, bilateral    Complication of anesthesia    pt reports low BP's after surgery at [facility] and difficulty awakening   Depression    Diabetes ([place])    dx 6-8 yrs ago   Dysrhythmia    a-fib   GERD (gastroesophageal reflux disease)    OCC TAKES ALKA SELTZER   History of kidney stones    10-15 yrs ago   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    Hyperlipidemia    Hypertension    Myocardial infarction ([place]) 12/2020   Nocturia    S/P ablation of atrial fibrillation    Ablative therapy   Sleep apnea    CPAP    Spinal stenosis    Tachycardia, unspecified     Family History: Non contributory to the present illness  Social History: Social History   Socioeconomic History   Marital status: Married    Spouse name: Malachy Mood    Number of children: 2   Years of education: Not on file   Highest education level: High school graduate  Occupational History   Occupation: retired   Tobacco Use   Smoking status: Never   Smokeless tobacco: Never  Vaping Use   Vaping Use: Never used  Substance and Sexual Activity   Alcohol use: No   Drug use: No   Sexual activity: Not on file  Other Topics Concern   Not on file  Social History Narrative   MarriedGets regular exercise.      [place] in  [place]/ daughter. Never smoked; no alcohol. 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    Social Determinants of Health   Financial Resource Strain: Low Risk  (01/20/2022)   Overall Financial Resource Strain (CARDIA)    Difficulty of Paying Living Expenses: Not hard at all  Food Insecurity: No Food Insecurity (01/20/2022)   Hunger Vital Sign    Worried About Running Out of Food in the Last Year: Never true    Ran Out of Food in the Last Year: Never true  Transportation Needs: No Transportation Needs (01/20/2022)   PRAPARE - Hydrologist (Medical): No    Lack of Transportation (Non-Medical): No  Physical Activity: Insufficiently Active (01/20/2022)   Exercise Vital Sign    Days of Exercise per Week: 3 days    Minutes of Exercise per Session: 20 min  Stress: No Stress Concern Present (01/20/2022)   Rote    Feeling of Stress : Only a little  Social Connections: Moderately Integrated (01/20/2022)    Social Connection and Isolation Panel [NHANES]    Frequency of Communication with Friends and Family: More than three times a week    Frequency of Social Gatherings with Friends and Family: More than three times a week    Attends Religious Services: More than 4 times per year    Active Member of [employer] or Organizations: No    Attends Archivist Meetings: Never    Marital Status: Married  Human resources officer Violence: Not At Risk (01/20/2022)   Humiliation, Afraid, Rape, and Kick questionnaire    Fear of Current or Ex-Partner: No    Emotionally Abused: No    Physically Abused: No    Sexually Abused: No    Vital Signs: Blood pressure (!) 140/80, pulse 62, resp. rate 18, height _0  (1.854 m), weight 186 lb (84.4 kg), SpO2 98 %. Body mass index is 24.54 kg/m.    Examination: General Appearance: The patient is well-developed, well-nourished, and in no distress. Neck Circumference: 41 cm Skin: Gross inspection of skin unremarkable. Head: normocephalic, no gross deformities. Eyes: no gross deformities noted. ENT: ears appear grossly normal Neurologic: Alert and oriented. No involuntary movements.  STOP BANG RISK ASSESSMENT S (snore) Have you been told that you snore?     NO   T (tired) Are you often tired, fatigued, or sleepy during the day?   NO  O (obstruction) Do you stop breathing, choke, or gasp during sleep? NO   P (pressure) Do you have or are you being treated for high blood pressure? YES   B (BMI) Is your body index greater than 35 kg/m? NO   A (age) Are you 57 years old or older? YES   N (neck) Do you have a neck circumference greater than 16 inches?   YES   G (gender) Are you a male? YES   TOTAL STOP/BANG "YES" ANSWERS 4       A STOP-Bang score of 2 or less is considered low risk, and a score of 5 or more is high risk for having either moderate or severe OSA. For people who score 3 or 4, doctors may need to perform further assessment to determine how  likely they are to have OSA.         EPWORTH SLEEPINESS SCALE:  Scale:  (0)= no chance of dozing; (1)= slight chance of dozing; (2)= moderate chance of dozing; (3)=  high chance of dozing  Chance  Situtation    Sitting and reading: 1    Watching TV: 2    Sitting Inactive in public: 0    As a passenger in car: 3      Lying down to rest: 2    Sitting and talking: 0    Sitting quielty after lunch: 1    In a car, stopped in traffic: 0   TOTAL SCORE:   9 out of 24    SLEEP STUDIES:  HST (05/12/21)  REI 14, min SPO2 83%   CPAP COMPLIANCE DATA:  Date Range: 11/13/21 - 01/11/22  Average Daily Use: 6 hours 51 minutes  Median Use: 7 hours 11 minutes  Compliance for > 4 Hours: 58 days  AHI: 12.8 respiratory events per hour  Days Used: 60/60  Mask Leak: 16.3  95th Percentile Pressure: 8 cmh20         LABS: Recent Results (from the past 2160 hour(s))  Pulmonary Function Test [facility] Only     Status: None   Collection Time: 10/26/21  2:56 PM  Result Value Ref Range   FVC-Pre 4.03 L   FVC-%Pred-Pre 87 %   FVC-Post 4.03 L   FVC-%Pred-Post 87 %   FVC-%Change-Post 0 %   FEV1-Pre 2.34 L   FEV1-%Pred-Pre 70 %   FEV1-Post 2.54 L   FEV1-%Pred-Post 77 %   FEV1-%Change-Post 8 %   FEV6-Pre 4.02 L   FEV6-%Pred-Pre 93 %   FEV6-Post 3.78 L   FEV6-%Pred-Post 87 %   FEV6-%Change-Post -6 %   Pre FEV1/FVC ratio 58 %   FEV1FVC-%Pred-Pre 80 %   Post FEV1/FVC ratio 63 %   FEV1FVC-%Change-Post 8 %   Pre FEV6/FVC Ratio 100 %   FEV6FVC-%Pred-Pre 106 %   Post FEV6/FVC ratio 100 %   FEV6FVC-%Pred-Post 106 %   FEV6FVC-%Change-Post 0 %   FEF 25-75 Pre 1.12 L/sec   FEF2575-%Pred-Pre 48 %   FEF 25-75 Post 2.06 L/sec   FEF2575-%Pred-Post 89 %   FEF2575-%Change-Post 83 %   RV 2.11 L   RV % pred 75 %   TLC 6.11 L   TLC % pred 79 %   DLCO unc 19.67 ml/min/mmHg   DLCO unc % pred 73 %   DL/VA 3.09 ml/min/mmHg/L   DL/VA % pred 80 %  Bayer DCA Hb A1c Waived     Status:  Abnormal   Collection Time: 01/02/22 11:08 AM  Result Value Ref Range   HB A1C (BAYER DCA - WAIVED) 6.5 (H) 4.8 - 5.6 %    Comment:          Prediabetes: 5.7 - 6.4          Diabetes: >6.4          Glycemic control for adults with diabetes: <7.0   Comprehensive metabolic panel     Status: Abnormal   Collection Time: 01/02/22 11:10 AM  Result Value Ref Range   Glucose 215 (H) 70 - 99 mg/dL   BUN 17 8 - 27 mg/dL   Creatinine, Ser 1.22 0.76 - 1.27 mg/dL   eGFR 60 >59 mL/min/1.73   BUN/Creatinine Ratio 14 10 - 24   Sodium 138 134 - 144 mmol/L   Potassium 4.1 3.5 - 5.2 mmol/L   Chloride 101 96 - 106 mmol/L   CO2 25 20 - 29 mmol/L   Calcium 9.4 8.6 - 10.2 mg/dL   Total Protein 6.5 6.0 - 8.5 g/dL   Albumin 4.0 3.8 -  4.8 g/dL   Globulin, Total 2.5 1.5 - 4.5 g/dL   Albumin/Globulin Ratio 1.6 1.2 - 2.2   Bilirubin Total 0.4 0.0 - 1.2 mg/dL   Alkaline Phosphatase 66 44 - 121 IU/L   AST 16 0 - 40 IU/L   ALT 11 0 - 44 IU/L  Lipid Panel w/o Chol/HDL Ratio     Status: None   Collection Time: 01/02/22 11:10 AM  Result Value Ref Range   Cholesterol, Total 103 100 - 199 mg/dL   Triglycerides 75 0 - 149 mg/dL   HDL 43 >39 mg/dL   VLDL Cholesterol Cal 16 5 - 40 mg/dL   LDL Chol Calc (NIH) 44 0 - 99 mg/dL  Magnesium     Status: None   Collection Time: 01/02/22 11:10 AM  Result Value Ref Range   Magnesium 1.6 1.6 - 2.3 mg/dL    Radiology: CT CHEST WO CONTRAST  Result Date: 11/13/2021 CLINICAL DATA:  Dyspnea, chronic, unclear etiology EXAM: CT CHEST WITHOUT CONTRAST TECHNIQUE: Multidetector CT imaging of the chest was performed following the standard protocol without IV contrast. RADIATION DOSE REDUCTION: This exam was performed according to the departmental dose-optimization program which includes automated exposure control, adjustment of the mA and/or kV according to patient size and/or use of iterative reconstruction technique. COMPARISON:  CT lumbar insert spine 06/23/2021, chest x-ray  08/05/2021 FINDINGS: Cardiovascular: Normal heart size. No significant pericardial effusion. Enlarged ascending thoracic aorta caliber measuring up to 4 cm. The descending thoracic aorta is normal in caliber. Moderate severe atherosclerotic plaque. Severe atherosclerotic plaque of the thoracic aorta. At least 3 vessel coronary artery calcifications. The main pulmonary artery measures at the upper limits of normal. Mediastinum/Nodes: No gross hilar adenopathy, noting limited sensitivity for the detection of hilar adenopathy on this noncontrast study. No enlarged mediastinal or axillary lymph nodes. Thyroid gland, trachea, and esophagus demonstrate no significant findings. Lungs/Pleura: Azygous fissure noted. No focal consolidation. A triangular 4 mm subpleural pulmonary nodule along the left major fissure likely representing an intrapulmonary lymph node. Triangular subpleural 6 x 5 mm subpleural nodule along the right major fissure likely representing an intrapulmonary lymph node. No pulmonary mass. No pleural effusion. No pneumothorax. Upper Abdomen: No acute abnormality. Musculoskeletal: No chest wall abnormality. Neural stimulator leads entering at the T12-L1 level with tip terminating at in the posterior central canal at the C7-T10 levels. No suspicious lytic or blastic osseous lesions. No acute displaced fracture. Multilevel degenerative changes of the spine. IMPRESSION: 1. Aneurysmal ascending thoracic aorta (4 cm). Recommend annual imaging followup by CTA or MRA. This recommendation follows 2010 ACCF/AHA/AATS/ACR/ASA/SCA/SCAI/SIR/STS/SVM Guidelines for the Diagnosis and Management of Patients with Thoracic Aortic Disease. Circulation. 2010; 121: L892-J194. Aortic aneurysm NOS (ICD10-I71.9). 2. Aortic Atherosclerosis (ICD10-I70.0) with at least 3 vessel coronary calcification. 3.  No acute intrathoracic abnormality. Electronically Signed   By: Iven Finn M.D.   On: 11/13/2021 21:00    No results  found.  No results found.    Assessment and Plan: Patient Active Problem List   Diagnosis Date Noted   CPAP use counseling 01/23/2022   COPD (chronic obstructive pulmonary disease) ([place]) 10/31/2021   Shortness of breath on exertion 08/04/2021   Diabetes mellitus with proteinuria ([place]) 04/04/2021   GERD without esophagitis 04/02/2021   Coronary artery disease 12/28/2020   History of non-ST elevation myocardial infarction (NSTEMI) 12/21/2020   Pain in right shin 10/25/2020   Peripheral vascular disease ([place]) 08/06/2020   Chronic pain syndrome 07/06/2020   Cervical facet joint  syndrome 04/08/2020   Spinal cord stimulator status 12/11/2019   Elevated TSH 08/20/2019   CKD (chronic kidney disease) stage 3, GFR 30-59 ml/min (HCC) 01/19/2019   Acquired thrombophilia ([place]) 01/19/2019   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   Postlaminectomy syndrome, lumbar region 01/16/2019   History of fusion of lumbar spine (L2-L5) 01/16/2019   Chronic radicular lumbar pain 01/16/2019   HNP (herniated nucleus pulposus), lumbar 04/29/2018   Advanced care planning/counseling discussion 09/28/2016   Spinal stenosis, lumbar region, 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/13/2016   Hyperlipidemia associated with type 2 diabetes mellitus ([place]) 07/14/2015   Symptomatic anemia 06/30/2015   Benign prostatic hyperplasia without lower urinary tract symptoms 06/02/2015   OSA (obstructive sleep apnea) 03/23/2015   Hypertension associated with diabetes ([place]) 09/28/2014   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[place]) 09/28/2014   H/O prior ablation treatment 10/19/2011   AF (paroxysmal atrial fibrillation) ([place]) 10/19/2011    1. OSA (obstructive sleep apnea) The patient does tolerate PAP and reports  benefit from PAP use. The patient was reminded how to clean equipment and advised to replace supplies routinely. The patient was also counselled on weight loss. He is doing much better since the change in pressure  from 8 cm to 5 to 15- we will continue with this for the next month and run a download. I have recommended a mask fitting, to address his leak.  The compliance is good. The AHI is 5.2.   OSA on cpap- controlled. Continue with excellent compliance with pap. CPAP continues to be medically necessary to treat this patient's OSA.Will continue 5 to 15, and do a 30 day download. Mask fitting.   2. CPAP use counseling CPAP Counseling: had a lengthy discussion with the patient regarding the importance of PAP therapy in management of the sleep apnea. Patient appears to understand the risk factor reduction and also understands the risks associated with untreated sleep apnea. Patient will try to make a good faith effort to remain compliant with therapy. Also instructed the patient on proper cleaning of the device including the water must be changed daily if possible and use of distilled water is preferred. Patient understands that the machine should be regularly cleaned with appropriate recommended cleaning solutions that do not damage the PAP machine for example given white vinegar and water rinses. Other methods such as ozone treatment may not be as good as these simple methods to achieve cleaning.   3. Hypertension associated with diabetes ([place]) Hypertension Counseling:   The following hypertensive lifestyle modification were recommended and discussed:  1. Limiting alcohol intake to less than 1 oz/day of ethanol:(24 oz of beer or 8 oz of wine or 2 oz of 100-proof whiskey). 2. Take baby ASA 81 mg daily. 3. Importance of regular aerobic exercise and losing weight. 4. Reduce dietary saturated fat and cholesterol intake for overall cardiovascular health. 5. Maintaining adequate dietary potassium, calcium, and magnesium intake. 6. Regular monitoring of the blood pressure. 7. Reduce sodium intake to less than 100 mmol/day (less than 2.3 gm of sodium or less than 6 gm of sodium choride)      General Counseling:  I have discussed the findings of the evaluation and examination with Theodoro Doing.  I have also discussed any further diagnostic evaluation thatmay be needed or ordered today. Crimson verbalizes understanding of the findings of todays visit. We also reviewed his medications today and discussed drug interactions and side effects including but not limited excessive drowsiness and altered mental states. We also discussed that there  is always a risk not just to him but also people around him. he has been encouraged to call the office with any questions or concerns that should arise related to todays visit.  No orders of the defined types were placed in this encounter.       I have personally obtained a history, examined the patient, evaluated laboratory and imaging results, formulated the assessment and plan and placed orders. This patient was seen today by Tressie Ellis, PA-C in collaboration with Dr. Devona Konig.   Allyne Gee, MD [facility] Diplomate ABMS Pulmonary Critical Care Medicine and Sleep Medicine

## 2022-01-24 ENCOUNTER — Ambulatory Visit: Payer: Medicare Other | Admitting: Gastroenterology

## 2022-02-03 ENCOUNTER — Other Ambulatory Visit: Payer: Self-pay | Admitting: Nurse Practitioner

## 2022-02-03 NOTE — Telephone Encounter (Signed)
[facility] called and spoke to [place], [facility] about the refill(s) benazepril requested. Advised it was sent on 12/06/21 #90/3 refill(s). She states that pt called in old refill #. She states that he called in for '40mg'$  but has '20mg'$  in the chart and pt pu #90 on 12/06/21. Advised will call pt and let him know that is old dose.   Spoke with pt. Pt had found current supply and realized earlier today that was an old bottle and was going to call pharmacy tomorrow. Advised him no need spoke with pharmacy and everything straight. Pt verbalized understanding.   Requested Prescriptions  Pending Prescriptions Disp Refills   benazepril (LOTENSIN) 40 MG tablet [Pharmacy Med Name: BENAZEPRIL HCL 40 MG TABLET] 90 tablet 0    Sig: Take 1 tablet (40 mg total) by mouth daily.     Cardiovascular:  ACE Inhibitors Failed - 02/03/2022  4:44 PM      Failed - Last BP in normal range    BP Readings from Last 1 Encounters:  01/23/22 (!) 140/80         Passed - Cr in normal range and within 180 days    Creatinine  Date Value Ref Range Status  07/01/2012 1.27 0.60 - 1.30 mg/dL Final   Creatinine, Ser  Date Value Ref Range Status  01/02/2022 1.22 0.76 - 1.27 mg/dL Final         Passed - K in normal range and within 180 days    Potassium  Date Value Ref Range Status  01/02/2022 4.1 3.5 - 5.2 mmol/L Final  07/01/2012 3.9 3.5 - 5.1 mmol/L Final         Passed - Patient is not pregnant      Passed - Valid encounter within last 6 months    Recent Outpatient Visits           1 month ago Diabetes mellitus with proteinuria ([place])   [place], Jolene T, NP   3 months ago Chronic obstructive pulmonary disease, unspecified COPD type ([place])   [place], Jolene T, NP   4 months ago Diabetes mellitus with proteinuria ([place])   [place], Zavalla T, NP   5 months ago Shortness of breath   [place], Santiago Glad, NP   5 months  ago Fatigue, unspecified type   [facility] [place], [name], NP       Future Appointments             In 6 days [place], Barbera Setters, NP [place]. [place]   In 1 week Jonathon Bellows, MD [place]   In 2 months [place], [name], NP [employer], [facility]

## 2022-02-07 NOTE — Progress Notes (Signed)
Cardiology Clinic Note   Patient Name: Sean Reyes. Date of Encounter: 02/09/2022  Primary Care Provider:  [name], NP Primary Cardiologist:  Kathlyn Sacramento, MD  Patient Profile    81 year old male with past medical history of paroxysmal atrial fibrillation, coronary disease, abnormal EKG with PVCs, essential hypertension, hyperlipidemia, type 2 diabetes, chronic kidney disease who is here today for follow-up on his paroxysmal atrial fibrillation.  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   Hyperlipidemia    Hypertension    Myocardial infarction ([place]) 12/2020   Nocturia    S/P ablation of atrial fibrillation    Ablative therapy   Sleep apnea    CPAP    Spinal stenosis    Tachycardia, unspecified    Past Surgical History:  Procedure Laterality Date   ABLATION     ANTERIOR LAT LUMBAR FUSION N/A 06/27/2017   Procedure: Anterior Lateral Lumbar Interbody  Fusion - Lumbar Two-Lumbar Three - Lumbar Three-Lumbar Four, Posterior Lumbar Interbody Fusion Lumbar Four- Five;  Surgeon: Kary Kos, MD;  Location: [facility];  Service: Neurosurgery;  Laterality: N/A;  Anterior Lateral Lumbar Interbody  Fusion - Lumbar Two-Lumbar Three - Lumbar Three-Lumbar Four, Posterior Lumbar Interbody Fusion Lumbar Four- Five   BACK SURGERY     CARDIAC CATHETERIZATION     CARDIOVERSION N/A 08/29/2018   Procedure: CARDIOVERSION;  Surgeon: Corey Skains, MD;  Location: [facility];  Service: Cardiovascular;   Laterality: N/A;   CARDIOVERSION N/A 09/24/2018   Procedure: CARDIOVERSION;  Surgeon: Corey Skains, MD;  Location: [facility];  Service: Cardiovascular;  Laterality: N/A;   COLONOSCOPY WITH PROPOFOL N/A 10/05/2015   Procedure: COLONOSCOPY WITH PROPOFOL;  Surgeon: Lollie Sails, MD;  Location: [facility] ENDOSCOPY;  Service: Endoscopy;  Laterality: N/A;   COLONOSCOPY WITH PROPOFOL N/A 11/01/2020   Procedure: COLONOSCOPY WITH PROPOFOL;  Surgeon: Jonathon Bellows, MD;  Location: [facility] ENDOSCOPY;  Service: Gastroenterology;  Laterality: N/A;   CORONARY STENT INTERVENTION N/A 12/22/2020   Procedure: CORONARY STENT INTERVENTION;  Surgeon: [name], MD;  Location: [place] [facility];  Service: Cardiovascular;  Laterality: N/A;   ESOPHAGOGASTRODUODENOSCOPY N/A 11/01/2020   Procedure: ESOPHAGOGASTRODUODENOSCOPY (EGD);  Surgeon: Jonathon Bellows, MD;  Location: [facility] ENDOSCOPY;  Service: Gastroenterology;  Laterality: N/A;   ESOPHAGOGASTRODUODENOSCOPY (EGD) WITH PROPOFOL N/A 04/01/2018   Procedure: ESOPHAGOGASTRODUODENOSCOPY (EGD) WITH PROPOFOL;  Surgeon: Lollie Sails, MD;  Location: [facility] ENDOSCOPY;  Service: Endoscopy;  Laterality: N/A;   HERNIA REPAIR     JOINT REPLACEMENT Bilateral    hips  RT+  LEFT X2    LEFT HEART CATH AND CORONARY ANGIOGRAPHY N/A 12/22/2020   Procedure: LEFT HEART CATH AND CORONARY ANGIOGRAPHY;  Surgeon: [name], MD;  Location: [place] [facility];  Service: Cardiovascular;  Laterality: N/A;   LEFT HEART CATH AND CORONARY ANGIOGRAPHY Left 08/23/2021  Procedure: LEFT HEART CATH AND CORONARY ANGIOGRAPHY;  Surgeon: Corey Skains, MD;  Location: [place] [facility];  Service: Cardiovascular;  Laterality: Left;   LUMBAR LAMINECTOMY/DECOMPRESSION MICRODISCECTOMY Left 09/13/2016   Procedure: Microdiscectomy - Lumbar two-three,  Lumbar three- - left;  Surgeon: Kary Kos, MD;  Location: [facility];  Service: Neurosurgery;  Laterality: Left;   SPINAL CORD STIMULATOR  INSERTION  07/08/2019   TONSILLECTOMY      Allergies  Allergies  Allergen Reactions   Levaquin [Levofloxacin In D5w] Anaphylaxis and Shortness Of Breath   Shellfish Allergy Anaphylaxis    Has used duraprep, betadine and ioban in previous surgeries in 2019 and 2018 without issue   Amiodarone Other (See Comments)    Tremors and thyroid toxicity   Adhesive [Tape] Other (See Comments)    Little red bumps under the dressing.  He questions whether is latex related    History of Present Illness    Sean Reyes. is a 81 year old male with previous mentioned past medical history of paroxysmal atrial fibrillation on chronic anticoagulation of apixaban, coronary disease, PVCs, essential hypertension, hyperlipidemia, type 2 diabetes, chronic kidney disease.  Cardiac catheterization was done 11/22 which showed severe proximal RCA stenosis which was treated successfully with PCI/DES.  Repeat cardiac catheterization showed patent RCA stent with no significant in-stent restenosis.  Borderline 60% stenosis of the distal LAD and mid distal left circumflex disease.  He underwent ablation procedure at [facility] years ago and reports having complications during the procedure necessitated before the procedure. He subsequently had ablation done at [facility] with improvement.  He was then placed on propafenone which was later changed to metoprolol after his myocardial infarction, but then had to be discontinued likely due to bradycardia.  Echocardiogram 09/07/2021 revealed LVEF 60 to 65%, no regional wall motion abnormalities, G1 DD, trivial mitral valve regurgitation.  ZIO monitor was worn revealed heart rates of 46-167 bpm with average heart rate of 64 bpm.  Predominant underlying rhythm was first-degree AV block and sinus.  There was 1 run of ventricular tachycardia that occurred lasting 7 beats with a  total of 96 supraventricular tachycardia runs.  He was last seen in clinic on 01/10/2022 stating that overall he was  doing well.  He presents for shortness of breath and improve.  He was continued to have concerns with elevated heart rates PVCs and recurrence of atrial fibrillation.  He has also had recent elevations in blood pressure.  He was started on amlodipine 5 mg at bedtime and encouraged to continue taking his blood pressure 1 to 2 hours after taking his medications.  He returns to clinic today stating that he has been doing well and that his blood pressure has been better controlled.  He denies any chest pain, shortness of breath, dyspnea on exertion, palpitations, lightheadedness, or visual changes.  He has been continuing to keep a blood pressure log at home with improvement in his blood pressure.  He denies any recent hospitalizations or visits to the emergency department.  Home Medications    Current Outpatient Medications  Medication Sig Dispense Refill   amLODipine (NORVASC) 5 MG tablet Take 1 tablet (5 mg total) by mouth at bedtime. 90 tablet 3   benazepril (LOTENSIN) 20 MG tablet Take 1 tablet (20 mg total) by mouth daily. 90 tablet 3   clopidogrel (PLAVIX) 75 MG tablet Take 75 mg by mouth daily.     cyanocobalamin 1000 MCG tablet Take by mouth.     ELIQUIS 5 MG TABS  tablet Take 1 tablet (5 mg total) by mouth 2 (two) times daily. 180 tablet 1   ferrous sulfate 325 (65 FE) MG EC tablet Take 325 mg by mouth daily with breakfast.     finasteride (PROSCAR) 5 MG tablet Take 1 tablet (5 mg total) by mouth daily. 90 tablet 2   isosorbide mononitrate (IMDUR) 30 MG 24 hr tablet Take 30 mg by mouth daily.     metFORMIN (GLUCOPHAGE) 500 MG tablet Take 1 tablet (500 mg total) by mouth 2 (two) times daily with a meal. 180 tablet 4   metoprolol succinate (TOPROL XL) 25 MG 24 hr tablet Take 1 tablet (25 mg total) by mouth daily. 30 tablet 5   omeprazole (PRILOSEC) 40 MG capsule Take 1 capsule (40 mg total) by mouth in the morning and at bedtime. 180 capsule 3   rosuvastatin (CRESTOR) 5 MG tablet Take 1 tablet  (5 mg total) by mouth daily. 90 tablet 0   solifenacin (VESICARE) 5 MG tablet Take 5 mg by mouth daily.     tamsulosin (FLOMAX) 0.4 MG CAPS capsule Take 0.8 mg by mouth daily.     No current facility-administered medications for this visit.     Family History    Family History  Problem Relation Age of Onset   Brain cancer Mother    Kidney disease Neg Hx    Prostate cancer Neg Hx    Kidney cancer Neg Hx    Bladder Cancer Neg Hx    He indicated that his mother is deceased. He indicated that his father is deceased. He indicated that the status of his neg hx is unknown.  Social History    Social History   Socioeconomic History   Marital status: Married    Spouse name: Malachy Mood    Number of children: 2   Years of education: Not on file   Highest education level: High school graduate  Occupational History   Occupation: retired   Tobacco Use   Smoking status: Never   Smokeless tobacco: Never  Vaping Use   Vaping Use: Never used  Substance and Sexual Activity   Alcohol use: No   Drug use: No   Sexual activity: Not on file  Other Topics Concern   Not on file  Social History Narrative   MarriedGets regular exercise.      Lives in [place] with wife/ daughter. Never smoked; no alcohol. 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    Social Determinants of Health   Financial Resource Strain: Low Risk  (01/20/2022)   Overall Financial Resource Strain (CARDIA)    Difficulty of Paying Living Expenses: Not hard at all  Food Insecurity: No Food Insecurity (01/20/2022)   Hunger Vital Sign    Worried About Running Out of Food in the Last Year: Never true    Ran Out of Food in the Last Year: Never true  Transportation Needs: No Transportation Needs (01/20/2022)   PRAPARE - Hydrologist (Medical): No    Lack of Transportation (Non-Medical): No  Physical Activity: Insufficiently Active (01/20/2022)   Exercise Vital Sign     Days of Exercise per Week: 3 days    Minutes of Exercise per Session: 20 min  Stress: No Stress Concern Present (01/20/2022)   [place]    Feeling of Stress : Only a little  Social Connections: Moderately Integrated (01/20/2022)   Social Connection and Isolation Panel [  NHANES]    Frequency of Communication with Friends and Family: More than three times a week    Frequency of Social Gatherings with Friends and Family: More than three times a week    Attends Religious Services: More than 4 times per year    Active Member of [employer] or Organizations: No    Attends Archivist Meetings: Never    Marital Status: Married  Human resources officer Violence: Not At Risk (01/20/2022)   Humiliation, Afraid, Rape, and Kick questionnaire    Fear of Current or Ex-Partner: No    Emotionally Abused: No    Physically Abused: No    Sexually Abused: No     Review of Systems    General:  No chills, fever, night sweats or weight changes.  Endorses exertional fatigue Cardiovascular:  No chest pain, neck dyspnea on exertion, edema, orthopnea, palpitations, paroxysmal nocturnal dyspnea. Dermatological: No rash, lesions/masses Respiratory: No cough, endorses chronic dyspnea Urologic: No hematuria, dysuria Abdominal:   No nausea, vomiting, diarrhea, bright red blood per rectum, melena, or hematemesis Neurologic:  No visual changes, wkns, changes in mental status. All other systems reviewed and are otherwise negative except as noted above.   Physical Exam    VS:  BP 138/64 (BP Location: Left Arm, Patient Position: Sitting, Cuff Size: Normal)   Pulse 60   Ht '6\' 1"'$  (1.854 m)   Wt 188 lb 6.4 oz (85.5 kg)   SpO2 98%   BMI 24.86 kg/m  , BMI Body mass index is 24.86 kg/m.     GEN: Well nourished, well developed, in no acute distress. HEENT: normal.  Glasses on Neck: Supple, no JVD, carotid bruits, or masses. Cardiac: RRR, no murmurs,  rubs, or gallops. No clubbing, cyanosis, edema.  Radials 2+/PT 2+ and equal bilaterally.  Respiratory:  Respirations regular and unlabored, clear to auscultation bilaterally. GI: Soft, nontender, nondistended, BS + x 4. MS: no deformity or atrophy. Skin: warm and dry, no rash. Neuro:  Strength and sensation are intact. Psych: Normal affect.  Accessory Clinical Findings    ECG personally reviewed by me today-sinus rhythm with a rate of 60, first-degree AV block, LVH, PACs- No acute changes  Lab Results  Component Value Date   WBC 5.5 10/07/2021   HGB 11.5 (L) 10/07/2021   HCT 33.9 (L) 10/07/2021   MCV 91.1 10/07/2021   PLT 175 10/07/2021   Lab Results  Component Value Date   CREATININE 1.22 01/02/2022   BUN 17 01/02/2022   NA 138 01/02/2022   K 4.1 01/02/2022   CL 101 01/02/2022   CO2 25 01/02/2022   Lab Results  Component Value Date   ALT 11 01/02/2022   AST 16 01/02/2022   ALKPHOS 66 01/02/2022   BILITOT 0.4 01/02/2022   Lab Results  Component Value Date   CHOL 103 01/02/2022   HDL 43 01/02/2022   LDLCALC 44 01/02/2022   TRIG 75 01/02/2022   CHOLHDL 2.6 12/21/2020    Lab Results  Component Value Date   HGBA1C 6.5 (H) 01/02/2022    Assessment & Plan   1.  Essential hypertension with blood pressure today of 138/64.  Blood pressure has been well-controlled as he has been keeping a log at home since starting amlodipine 5 mg at bedtime.  He is also continued on his benazepril 20 mg in the morning and Toprol-XL 25 mg daily.  No other changes were made to his medication regimen today and he requires no refills.  2.  Paroxysmal atrial fibrillation currently in sinus today on EKG.  He has continued on Toprol-XL 25 mg daily and apixaban 5 mg twice daily for CHA2DS2-VASc score of at least 4.   3.  Coronary artery disease native coronary artery status post myocardial infarction with PCI/DES to the RCA in 12/2018.  He continues on clopidogrel 75 mg daily, apixaban 5 mg  twice daily and lieu of aspirin, Imdur, and rosuvastatin.  He denies any anginal or anginal equivalents.  There are no ischemic changes noted on his EKG.  4.  Hyperlipidemia his LDL continues to remain at goal.  He is continued on rosuvastatin.  This continues to be monitored by his PCP.  5.  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  He states that his shortness of breath has been stable believes he is slightly improving.  He continues to increase his activity and noticed that his dyspnea and his shortness of breath has been getting slowly improved and of his his stamina is improving as well.  He has been encouraged to maintain all follow-ups with pulmonary and if further changes are noted in his breathing he may want to reconsider using inhaler therapy which he has previously declined.   6.  Disposition patient return to clinic to see MD/APP in 6 months or sooner if needed.   CHA2DS2-VASc Score = 4   This indicates a 4.8% annual risk of stroke. The patient's score is based upon: CHF History: 0 HTN History: 1 Diabetes History: 0 Stroke History: 0 Vascular Disease History: 1 Age Score: 2 Gender Score: 0     Dulcinea Kinser, NP 02/09/2022, 3:46 PM

## 2022-02-09 ENCOUNTER — Ambulatory Visit: Payer: Medicare Other | Attending: Cardiology | Admitting: Cardiology

## 2022-02-09 ENCOUNTER — Encounter: Payer: Self-pay | Admitting: Cardiology

## 2022-02-09 VITALS — BP 138/64 | HR 60 | Ht 73.0 in | Wt 188.4 lb

## 2022-02-09 DIAGNOSIS — I2510 Atherosclerotic heart disease of native coronary artery without angina pectoris: Secondary | ICD-10-CM | POA: Insufficient documentation

## 2022-02-09 DIAGNOSIS — E785 Hyperlipidemia, unspecified: Secondary | ICD-10-CM | POA: Insufficient documentation

## 2022-02-09 DIAGNOSIS — I10 Essential (primary) hypertension: Secondary | ICD-10-CM | POA: Diagnosis not present

## 2022-02-09 DIAGNOSIS — J449 Chronic obstructive pulmonary disease, unspecified: Secondary | ICD-10-CM | POA: Diagnosis not present

## 2022-02-09 DIAGNOSIS — I480 Paroxysmal atrial fibrillation: Secondary | ICD-10-CM | POA: Diagnosis not present

## 2022-02-09 NOTE — Patient Instructions (Signed)
Medication Instructions:  Your physician recommends that you continue on your current medications as directed. Please refer to the Current Medication list given to you today. *If you need a refill on your cardiac medications before your next appointment, please call your pharmacy*  Lab Work: No labs ordered  If you have labs (blood work) drawn today and your tests are completely normal, you will receive your results only by: [place] (if you have MyChart) OR A paper copy in the mail If you have any lab test that is abnormal or we need to change your treatment, we will call you to review the results.   Testing/Procedures: No labs ordered  Follow-Up: At [facility], you and your health needs are our priority.  As part of our continuing mission to provide you with exceptional heart care, we have created designated Provider Care Teams.  These Care Teams include your primary Cardiologist (physician) and Advanced Practice Providers (APPs -  Physician Assistants and Nurse Practitioners) who all work together to provide you with the care you need, when you need it.  We recommend signing up for the patient portal called "MyChart".  Sign up information is provided on this After Visit Summary.  MyChart is used to connect with patients for Virtual Visits (Telemedicine).  Patients are able to view lab/test results, encounter notes, upcoming appointments, etc.  Non-urgent messages can be sent to your provider as well.   To learn more about what you can do with MyChart, go to [email or web address].    Your next appointment:   6 month(s)  The format for your next appointment:   In Person  Provider:   You may see Kathlyn Sacramento, MD or one of the following Advanced Practice Providers on your designated Care Team:   Murray Hodgkins, NP Christell Faith, PA-C Cadence Kathlen Mody, PA-C Gerrie Nordmann, NP   Important Information About Sugar

## 2022-02-12 DIAGNOSIS — N1831 Chronic kidney disease, stage 3a: Secondary | ICD-10-CM

## 2022-02-12 DIAGNOSIS — E1159 Type 2 diabetes mellitus with other circulatory complications: Secondary | ICD-10-CM

## 2022-02-12 DIAGNOSIS — E1129 Type 2 diabetes mellitus with other diabetic kidney complication: Secondary | ICD-10-CM

## 2022-02-12 DIAGNOSIS — E1143 Type 2 diabetes mellitus with diabetic autonomic (poly)neuropathy: Secondary | ICD-10-CM

## 2022-02-12 DIAGNOSIS — R809 Proteinuria, unspecified: Secondary | ICD-10-CM

## 2022-02-12 DIAGNOSIS — I152 Hypertension secondary to endocrine disorders: Secondary | ICD-10-CM

## 2022-02-14 ENCOUNTER — Encounter: Payer: Self-pay | Admitting: Internal Medicine

## 2022-02-16 ENCOUNTER — Encounter: Payer: Self-pay | Admitting: Gastroenterology

## 2022-02-16 ENCOUNTER — Encounter: Payer: Self-pay | Admitting: Nurse Practitioner

## 2022-02-16 ENCOUNTER — Ambulatory Visit (INDEPENDENT_AMBULATORY_CARE_PROVIDER_SITE_OTHER): Payer: Medicare Other | Admitting: Gastroenterology

## 2022-02-16 VITALS — BP 161/71 | HR 39 | Temp 97.6°F | Wt 186.6 lb

## 2022-02-16 DIAGNOSIS — R1310 Dysphagia, unspecified: Secondary | ICD-10-CM | POA: Diagnosis not present

## 2022-02-16 DIAGNOSIS — R001 Bradycardia, unspecified: Secondary | ICD-10-CM | POA: Diagnosis not present

## 2022-02-16 DIAGNOSIS — R633 Feeding difficulties, unspecified: Secondary | ICD-10-CM | POA: Diagnosis not present

## 2022-02-16 NOTE — Patient Instructions (Addendum)
We will be calling you with the date for your barium swallow.

## 2022-02-16 NOTE — Progress Notes (Signed)
Sean Bellows MD, MRCP(U.K) [address]  [place]  [place], [place] [address]  Main: [phone]  Fax: [phone]   Primary Care Physician: [name], NP  Primary Gastroenterologist:  Dr. Jonathon Reyes   Chief Complaint  Patient presents with   Dysphagia    HPI: Sean Reyes. is a 82 y.o. male Summary of history : Previously seen and evaluated for dysphagia secondary to a Schatzki's ring that was dilated, C. difficile diarrhea.  Iron deficiency anemia, GERD with esophagitis, hiatal hernia.   .He has previously been a patient of [facility] last seen at their office in July 2021 for diarrhea. Positive for Salmonella treated with antibiotics, esophagitis, GERD, dysphagia.  Last colonoscopy in 2017 showed diverticulosis multiple nonbleeding AVMs throughout the colon.  EGD in February 2020 by Dr. Gustavo Lah for dysphagia found to have a hiatal hernia, LA grade B esophagitis.   On metformin.  Stool occult test was positive on 09/14/2020, iron studies are normal with a ferritin of 66 and normal TIBC.  Hemoglobin 11.1 g with an MCV of 93.  His creatinine is 1.38.  He is on Eliquis. History of esophagitis.  2022 treated for C. difficile diarrhea   11/01/2020: EGD: 4 Schatzki's ring seen at the GE junction dilated to 18 mm biopsies esophagus were taken medium size hiatal hernia was noted.  Colonoscopy performed on the same day.  Nonbleeding internal hemorrhoids noted.  Otherwise normal.  Biopsies esophagus showed no abnormalities. 10/04/2020: C. difficile toxin positive.  Commenced on vancomycin 4 times daily. 09/29/2020: B12 and folate normal.   Interval history 11/15/2021-02/16/2022   He states that despite increasing his dose of omeprazole to twice a day he still has difficulty swallowing tablets and certain foods points to his neck where it gets stuck.   Current Outpatient Medications  Medication Sig Dispense Refill   amLODipine (NORVASC) 5 MG tablet Take 1  tablet (5 mg total) by mouth at bedtime. 90 tablet 3   benazepril (LOTENSIN) 20 MG tablet Take 1 tablet (20 mg total) by mouth daily. 90 tablet 3   clopidogrel (PLAVIX) 75 MG tablet Take 75 mg by mouth daily.     cyanocobalamin 1000 MCG tablet Take by mouth.     ELIQUIS 5 MG TABS tablet Take 1 tablet (5 mg total) by mouth 2 (two) times daily. 180 tablet 1   ferrous sulfate 325 (65 FE) MG EC tablet Take 325 mg by mouth daily with breakfast.     finasteride (PROSCAR) 5 MG tablet Take 1 tablet (5 mg total) by mouth daily. 90 tablet 2   isosorbide mononitrate (IMDUR) 30 MG 24 hr tablet Take 30 mg by mouth daily.     metFORMIN (GLUCOPHAGE) 500 MG tablet Take 1 tablet (500 mg total) by mouth 2 (two) times daily with a meal. 180 tablet 4   metoprolol succinate (TOPROL XL) 25 MG 24 hr tablet Take 1 tablet (25 mg total) by mouth daily. 30 tablet 5   omeprazole (PRILOSEC) 40 MG capsule Take 1 capsule (40 mg total) by mouth in the morning and at bedtime. 180 capsule 3   rosuvastatin (CRESTOR) 5 MG tablet Take 1 tablet (5 mg total) by mouth daily. 90 tablet 0   solifenacin (VESICARE) 5 MG tablet Take 5 mg by mouth daily.     tamsulosin (FLOMAX) 0.4 MG CAPS capsule Take 0.8 mg by mouth daily.     No current facility-administered medications for this visit.    Allergies  as of 02/16/2022 - Review Complete 02/16/2022  Allergen Reaction Noted   Levaquin [levofloxacin in d5w] Anaphylaxis and Shortness Of Breath 07/27/2014   Shellfish allergy Anaphylaxis 08/26/2014   Amiodarone Other (See Comments) 04/19/2015   Adhesive [tape] Other (See Comments) 04/19/2018    ROS:  General: Negative for anorexia, weight loss, fever, chills, fatigue, weakness. ENT: Negative for hoarseness, difficulty swallowing , nasal congestion. CV: Negative for chest pain, angina, palpitations, dyspnea on exertion, peripheral edema.  Respiratory: Negative for dyspnea at rest, dyspnea on exertion, cough, sputum, wheezing.  GI: See  history of present illness. GU:  Negative for dysuria, hematuria, urinary incontinence, urinary frequency, nocturnal urination.  Endo: Negative for unusual weight change.    Physical Examination:   BP (!) 161/71   Pulse (!) 39   Temp 97.6 F (36.4 C) (Oral)   Wt 186 lb 9.6 oz (84.6 kg)   BMI 24.62 kg/m   General: Well-nourished, well-developed in no acute distress.  Eyes: No icterus. Conjunctivae pink. Neuro: Alert and oriented x 3.  Grossly intact. Skin: Warm and dry, no jaundice.   Psych: Alert and cooperative, normal mood and affect.   Imaging Studies: No results found.  Assessment and Plan:   Sean Reyes. is a 82 y.o. y/o male here to see me here as a follow-up for dysphagia.  Previously had evaluation including a modified barium swallow, EGD, dilation of Schatzki's ring eosinophilic esophagitis ruled out.  The issue of swallowing tablets still persists despite increasing the dose of PPI.  I will plan to repeat his barium swallow with a tablet as well as modified barium swallow.  I explained to him at times we may not find a reason for the dysphagia and it could be functional if about tests do not show any abnormality we will not investigate any further  At next that depending on the results we will reduce the dose of his PPI  We noted at his office visit his heart rate was 39 bpm, normotensive asymptomatic denies any dizziness we will inform [name], NP and his cardiologist office.  I have advised him to see if he can reduce his dose of metoprolol to 12.5 mg from 25 mg till he hears back from his primary care provider or cardiology office   Dr Sean Bellows  MD,MRCP [facility]) Follow up in 2 to 3 months

## 2022-02-16 NOTE — Addendum Note (Signed)
Addended by: Wayna Chalet on: 02/16/2022 03:13 PM   Modules accepted: Orders

## 2022-02-17 ENCOUNTER — Encounter: Payer: Self-pay | Admitting: Cardiology

## 2022-02-17 ENCOUNTER — Ambulatory Visit: Payer: Medicare Other | Attending: Cardiology | Admitting: Cardiology

## 2022-02-17 ENCOUNTER — Encounter: Payer: Self-pay | Admitting: Internal Medicine

## 2022-02-17 VITALS — BP 130/54 | HR 74 | Ht 73.0 in | Wt 184.2 lb

## 2022-02-17 DIAGNOSIS — I10 Essential (primary) hypertension: Secondary | ICD-10-CM | POA: Diagnosis not present

## 2022-02-17 DIAGNOSIS — I2510 Atherosclerotic heart disease of native coronary artery without angina pectoris: Secondary | ICD-10-CM

## 2022-02-17 DIAGNOSIS — R001 Bradycardia, unspecified: Secondary | ICD-10-CM | POA: Diagnosis not present

## 2022-02-17 DIAGNOSIS — R0609 Other forms of dyspnea: Secondary | ICD-10-CM | POA: Diagnosis not present

## 2022-02-17 DIAGNOSIS — E785 Hyperlipidemia, unspecified: Secondary | ICD-10-CM | POA: Diagnosis not present

## 2022-02-17 DIAGNOSIS — J449 Chronic obstructive pulmonary disease, unspecified: Secondary | ICD-10-CM | POA: Diagnosis not present

## 2022-02-17 DIAGNOSIS — I480 Paroxysmal atrial fibrillation: Secondary | ICD-10-CM

## 2022-02-17 MED ORDER — METOPROLOL SUCCINATE ER 25 MG PO TB24: 12.5000 mg | ORAL_TABLET | Freq: Every day | ORAL | 1 refills | Status: DC

## 2022-02-17 NOTE — Patient Instructions (Addendum)
Medication Instructions:  DECREASE the Metoprolol Succinate to 12.5 mg once daily (half a tablet)  *If you need a refill on your cardiac medications before your next appointment, please call your pharmacy*   Lab Work: None ordered If you have labs (blood work) drawn today and your tests are completely normal, you will receive your results only by: [place] (if you have MyChart) OR A paper copy in the mail If you have any lab test that is abnormal or we need to change your treatment, we will call you to review the results.   Testing/Procedures: None ordered   Follow-Up: At [facility], you and your health needs are our priority.  As part of our continuing mission to provide you with exceptional heart care, we have created designated Provider Care Teams.  These Care Teams include your primary Cardiologist (physician) and Advanced Practice Providers (APPs -  Physician Assistants and Nurse Practitioners) who all work together to provide you with the care you need, when you need it.  We recommend signing up for the patient portal called "MyChart".  Sign up information is provided on this After Visit Summary.  MyChart is used to connect with patients for Virtual Visits (Telemedicine).  Patients are able to view lab/test results, encounter notes, upcoming appointments, etc.  Non-urgent messages can be sent to your provider as well.   To learn more about what you can do with MyChart, go to [email or web address].    Your next appointment:   4 week(s)  The format for your next appointment:   In Person  Provider:   You may see Kathlyn Sacramento, MD or one of the following Advanced Practice Providers on your designated Care Team:   Murray Hodgkins, NP Christell Faith, PA-C Cadence Kathlen Mody, PA-C Gerrie Nordmann, NP

## 2022-02-17 NOTE — Progress Notes (Signed)
Cardiology Clinic Note   Patient Name: Sean Reyes. Date of Encounter: 02/17/2022  Primary Care Provider:  [name], NP Primary Cardiologist:  Sean Sacramento, MD  Patient Profile    82 year old male with a past medical history of paroxysmal atrial fibrillation, coronary disease, abnormal EKG with PVCs, essential hypertension, hyperlipidemia, type 2 diabetes, chronic kidney disease who is here today as a work-in for follow-up after concerns for asymptomatic bradycardia during recent GI appointment.  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   Hyperlipidemia    Hypertension    Myocardial infarction ([place]) 12/2020   Nocturia    S/P ablation of atrial fibrillation    Ablative therapy   Sleep apnea    CPAP    Spinal stenosis    Tachycardia, unspecified    Past Surgical History:  Procedure Laterality Date   ABLATION     ANTERIOR LAT LUMBAR FUSION N/A 06/27/2017   Procedure: Anterior Lateral Lumbar Interbody  Fusion - Lumbar Two-Lumbar Three - Lumbar Three-Lumbar Four, Posterior Lumbar Interbody Fusion Lumbar Four- Five;  Surgeon: Sean Kos, MD;  Location: [place];  Service: Neurosurgery;  Laterality: N/A;  Anterior Lateral Lumbar Interbody  Fusion - Lumbar Two-Lumbar Three - Lumbar Three-Lumbar Four, Posterior Lumbar Interbody Fusion Lumbar Four- Five   BACK SURGERY     CARDIAC CATHETERIZATION     CARDIOVERSION N/A 08/29/2018   Procedure: CARDIOVERSION;  Surgeon: Sean Skains, MD;   Location: [facility];  Service: Cardiovascular;  Laterality: N/A;   CARDIOVERSION N/A 09/24/2018   Procedure: CARDIOVERSION;  Surgeon: Sean Skains, MD;  Location: [facility];  Service: Cardiovascular;  Laterality: N/A;   COLONOSCOPY WITH PROPOFOL N/A 10/05/2015   Procedure: COLONOSCOPY WITH PROPOFOL;  Surgeon: Sean Sails, MD;  Location: [facility] ENDOSCOPY;  Service: Endoscopy;  Laterality: N/A;   COLONOSCOPY WITH PROPOFOL N/A 11/01/2020   Procedure: COLONOSCOPY WITH PROPOFOL;  Surgeon: Sean Bellows, MD;  Location: [facility] ENDOSCOPY;  Service: Gastroenterology;  Laterality: N/A;   CORONARY STENT INTERVENTION N/A 12/22/2020   Procedure: CORONARY STENT INTERVENTION;  Surgeon: Sean Cowman, MD;  Location: [place] [facility];  Service: Cardiovascular;  Laterality: N/A;   ESOPHAGOGASTRODUODENOSCOPY N/A 11/01/2020   Procedure: ESOPHAGOGASTRODUODENOSCOPY (EGD);  Surgeon: Sean Bellows, MD;  Location[facility] ENDOSCOPY;  Service: Gastroenterology;  Laterality: N/A;   ESOPHAGOGASTRODUODENOSCOPY (EGD) WITH PROPOFOL N/A 04/01/2018   Procedure: ESOPHAGOGASTRODUODENOSCOPY (EGD) WITH PROPOFOL;  Surgeon: Sean Sails, MD;  Location: [facility] ENDOSCOPY;  Service: Endoscopy;  Laterality: N/A;   HERNIA REPAIR     JOINT REPLACEMENT Bilateral    hips  RT+  LEFT X2    LEFT HEART CATH AND CORONARY ANGIOGRAPHY N/A 12/22/2020   Procedure: LEFT HEART CATH AND CORONARY ANGIOGRAPHY;  Surgeon: Sean Cowman, MD;  Location: [place] [facility];  Service: Cardiovascular;  Laterality: N/A;  LEFT HEART CATH AND CORONARY ANGIOGRAPHY Left 08/23/2021   Procedure: LEFT HEART CATH AND CORONARY ANGIOGRAPHY;  Surgeon: Sean Skains, MD;  Location: [place] [facility];  Service: Cardiovascular;  Laterality: Left;   LUMBAR LAMINECTOMY/DECOMPRESSION MICRODISCECTOMY Left 09/13/2016   Procedure: Microdiscectomy - Lumbar two-three,  Lumbar three- - left;  Surgeon: Sean Kos, MD;  Location: [place];  Service:  Neurosurgery;  Laterality: Left;   SPINAL CORD STIMULATOR INSERTION  07/08/2019   TONSILLECTOMY      Allergies  Allergies  Allergen Reactions   Levaquin [Levofloxacin In D5w] Anaphylaxis and Shortness Of Breath   Shellfish Allergy Anaphylaxis    Has used duraprep, betadine and ioban in previous surgeries in 2019 and 2018 without issue   Amiodarone Other (See Comments)    Tremors and thyroid toxicity   Adhesive [Tape] Other (See Comments)    Little red bumps under the dressing.  He questions whether is latex related    History of Present Illness    Sean Reyes. is a 82 year old male with previously mentioned past medical history paroxysmal atrial fibrillation on chronic anticoagulation of apixaban, coronary artery disease, abnormal EKG with frequent PVCs and PACs, send hypertension, hyperlipidemia, type 2 diabetes and chronic kidney disease.  Cardiac catheterization 11/22 severe proximal RCA stenosis with successful PCI-DES.  Repeat cardiac catheterization showed patent RCA stent with no significant in-stent restenosis.  He had undergone ablation procedure at [facility] several years ago and reports having complications during the procedure.  He subsequently had the ablation done at [place] at a later date.  He was originally placed on propafenone which was later changed to metoprolol after his myocardial infarction there was subsequently discontinued due to bradycardia.  Echocardiogram 09/07/2021 revealed LVEF of 60 to 60%, no regional wall motion abnormalities, G1 DD, trivial MR.  ZIO monitor revealed predominantly underlying rhythm of sinus with first-degree AV block, 1 run of ventricular tachycardia lasting 7 beats for total of 96 supraventricular tachycardia runs.  He was restarted on beta-blocker therapy.  He was last seen in clinic 02/01/2022 stating that he was doing well.  He recently had medication changes made to improve blood pressure management that he had been monitoring at home with  noted improvement.  He returns to clinic today after being seen by GI yesterday with findings of asymptomatic bradycardia with a heart rate of 39 and slightly hypotensive with systolic blood pressure of 160s.  He states that he was a symptomatic and had not had any previous or further episodes of extreme bradycardia.  He does continue to monitor his blood pressure and his heart rate at home.  This morning prior to coming to the office for his work in he stated that his heart rate was 55-60 which is typically where he has been running since being restarted on beta-blocker therapy.  He does endorse some fatigue, continued chronic shortness of breath, and occasional palpitations.  Home Medications    Current Outpatient Medications  Medication Sig Dispense Refill   amLODipine (NORVASC) 5 MG tablet Take 1 tablet (5 mg total) by mouth at bedtime. 90 tablet 3   benazepril (LOTENSIN) 20 MG tablet Take 1 tablet (20 mg total) by mouth daily. 90 tablet 3   clopidogrel (PLAVIX) 75 MG tablet Take 75 mg by mouth daily.     cyanocobalamin 1000 MCG tablet Take by mouth.     ELIQUIS 5 MG TABS tablet Take 1 tablet (5 mg total) by mouth 2 (two) times daily. 180 tablet 1  ferrous sulfate 325 (65 FE) MG EC tablet Take 325 mg by mouth daily with breakfast.     finasteride (PROSCAR) 5 MG tablet Take 1 tablet (5 mg total) by mouth daily. 90 tablet 2   isosorbide mononitrate (IMDUR) 30 MG 24 hr tablet Take 30 mg by mouth daily.     metFORMIN (GLUCOPHAGE) 500 MG tablet Take 1 tablet (500 mg total) by mouth 2 (two) times daily with a meal. 180 tablet 4   omeprazole (PRILOSEC) 40 MG capsule Take 1 capsule (40 mg total) by mouth in the morning and at bedtime. 180 capsule 3   rosuvastatin (CRESTOR) 5 MG tablet Take 1 tablet (5 mg total) by mouth daily. 90 tablet 0   solifenacin (VESICARE) 5 MG tablet Take 5 mg by mouth daily.     tamsulosin (FLOMAX) 0.4 MG CAPS capsule Take 0.8 mg by mouth daily.     metoprolol succinate  (TOPROL XL) 25 MG 24 hr tablet Take 0.5 tablets (12.5 mg total) by mouth daily. 45 tablet 1   No current facility-administered medications for this visit.     Family History    Family History  Problem Relation Age of Onset   Brain cancer Mother    Kidney disease Neg Hx    Prostate cancer Neg Hx    Kidney cancer Neg Hx    Bladder Cancer Neg Hx    He indicated that his mother is deceased. He indicated that his father is deceased. He indicated that the status of his neg hx is unknown.  Social History    Social History   Socioeconomic History   Marital status: Married    Spouse name: Malachy Mood    Number of children: 2   Years of education: Not on file   Highest education level: High school graduate  Occupational History   Occupation: retired   Tobacco Use   Smoking status: Never   Smokeless tobacco: Never  Vaping Use   Vaping Use: Never used  Substance and Sexual Activity   Alcohol use: No   Drug use: No   Sexual activity: Not on file  Other Topics Concern   Not on file  Social History Narrative   MarriedGets regular exercise.      Lives in [place] with wife/ daughter. Never smoked; no alcohol. 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    Social Determinants of Health   Financial Resource Strain: Low Risk  (01/20/2022)   Overall Financial Resource Strain (CARDIA)    Difficulty of Paying Living Expenses: Not hard at all  Food Insecurity: No Food Insecurity (01/20/2022)   Hunger Vital Sign    Worried About Running Out of Food in the Last Year: Never true    Ran Out of Food in the Last Year: Never true  Transportation Needs: No Transportation Needs (01/20/2022)   PRAPARE - Hydrologist (Medical): No    Lack of Transportation (Non-Medical): No  Physical Activity: Insufficiently Active (01/20/2022)   Exercise Vital Sign    Days of Exercise per Week: 3 days    Minutes of Exercise per Session: 20 min   Stress: No Stress Concern Present (01/20/2022)   [place]    Feeling of Stress : Only a little  Social Connections: Moderately Integrated (01/20/2022)   Social Connection and Isolation Panel [NHANES]    Frequency of Communication with Friends and Family: More than three times a week  Frequency of Social Gatherings with Friends and Family: More than three times a week    Attends Religious Services: More than 4 times per year    Active Member of [employer] or Organizations: No    Attends Archivist Meetings: Never    Marital Status: Married  Human resources officer Violence: Not At Risk (01/20/2022)   Humiliation, Afraid, Rape, and Kick questionnaire    Fear of Current or Ex-Partner: No    Emotionally Abused: No    Physically Abused: No    Sexually Abused: No     Review of Systems    General:  No chills, fever, night sweats or weight changes.  Endorses fatigue Cardiovascular:  No chest pain, endorses chronic dyspnea on exertion, edema, orthopnea, endorses occasional palpitations, paroxysmal nocturnal dyspnea. Dermatological: No rash, lesions/masses Respiratory: No cough, endorses chronic dyspnea from COPD Urologic: No hematuria, dysuria Abdominal:   No nausea, vomiting, diarrhea, bright red blood per rectum, melena, or hematemesis Neurologic:  No visual changes, wkns, changes in mental status. All other systems reviewed and are otherwise negative except as noted above.   Physical Exam    VS:  BP (!) 130/54 (BP Location: Left Arm, Patient Position: Sitting, Cuff Size: Normal)   Pulse 74   Ht '6\' 1"'$  (1.854 m)   Wt 184 lb 4 oz (83.6 kg)   SpO2 97%   BMI 24.31 kg/m  , BMI Body mass index is 24.31 kg/m.     GEN: Well nourished, well developed, in no acute distress. HEENT: normal.  Glasses on. Neck: Supple, no JVD, carotid bruits, or masses. Cardiac: Irregularly irregular, no murmurs, rubs, or gallops. No clubbing,  cyanosis, edema.  Radials 2+/PT 2+ and equal bilaterally.  Respiratory:  Respirations regular and unlabored, clear to auscultation bilaterally. GI: Soft, nontender, nondistended, BS + x 4. MS: no deformity or atrophy. Skin: warm and dry, no rash. Neuro:  Strength and sensation are intact. Psych: Normal affect.  Accessory Clinical Findings    ECG personally reviewed by me today-sinus rhythm rate of 74, first-degree AV block, PACs- No acute changes  Lab Results  Component Value Date   WBC 5.5 10/07/2021   HGB 11.5 (L) 10/07/2021   HCT 33.9 (L) 10/07/2021   MCV 91.1 10/07/2021   PLT 175 10/07/2021   Lab Results  Component Value Date   CREATININE 1.22 01/02/2022   BUN 17 01/02/2022   NA 138 01/02/2022   K 4.1 01/02/2022   CL 101 01/02/2022   CO2 25 01/02/2022   Lab Results  Component Value Date   ALT 11 01/02/2022   AST 16 01/02/2022   ALKPHOS 66 01/02/2022   BILITOT 0.4 01/02/2022   Lab Results  Component Value Date   CHOL 103 01/02/2022   HDL 43 01/02/2022   LDLCALC 44 01/02/2022   TRIG 75 01/02/2022   CHOLHDL 2.6 12/21/2020    Lab Results  Component Value Date   HGBA1C 6.5 (H) 01/02/2022    Assessment & Plan   1.  Asymptomatic sinus bradycardia was originally noted at GI appointment on yesterday.  Mr. Karwowski was brought in as a work in today and to determine his heart rate and any symptoms that he was suffering from.  Patient has symptoms of fatigue and chronic shortness of breath from his COPD and occasional palpitations but denies any changes in the chronic symptoms that he continues to suffer from.  EKG today revealed sinus rhythm with a first-degree AV block with PACs.  He stated prior  to leaving the house this morning he checked his blood pressure and his heart rate blood pressure remained stable heart rate registered and 56.  We did advise him that with ambulation and activity and increase in his heart rate is the expected finding.  He has a known history of  having bradycardia and previously had been to be admitted discontinued off of beta-blocker therapy of metoprolol.  Will decrease his Toprol-XL to 12.5 mg from 25 mg daily.  2.  Essential hypertension with blood pressure today 130/54.  Blood pressure has been continued to remain stable.  Yesterday there was a slight elevation of systolic of 330 noted at his GI appointment.  He is to continue to keep a log of his blood pressures and heart rates at home.  He is continued on amlodipine 5 mg at bedtime, benazepril 20 mg the morning, and metoprolol has been decreased to 12.5 mg daily.  3.  Paroxysmal atrial fibrillation currently in sinus rhythm on EKG.  He is continued on Toprol-XL 12.5 mg daily for rate control and apixaban 5 mg twice daily for CHA2DS2-VASc score of at least 4.  He has not had any issues with bleeding and no signs of active bleeding noted today.  4.  Hyperlipidemia with his LDL continues to remain at goal.  He is continued on his rosuvastatin.  This continues to be monitored by PCP.  5.  Chronic dyspnea on exertion recently diagnosed with COPD Gold stage IIc.  Shortness of breath has been stable.  He is continuing to increase his activity as he and his wife are both attending at [employer].  6.  Disposition patient return to clinic to see MD/APP or 4 weeks or sooner if needed after reduction of metoprolol has been made.  If continued changes in heart rate noted at home with blood pressure changes will consider placing him back on a ZIO monitor.  Naia Ruff, NP 02/17/2022, 8:54 AM

## 2022-02-21 ENCOUNTER — Other Ambulatory Visit: Payer: Self-pay | Admitting: Gastroenterology

## 2022-02-21 ENCOUNTER — Ambulatory Visit
Admission: RE | Admit: 2022-02-21 | Discharge: 2022-02-21 | Disposition: A | Discharge status: Home / Self Care | Payer: Medicare Other | Source: Ambulatory Visit | Attending: Gastroenterology | Admitting: Gastroenterology

## 2022-02-21 DIAGNOSIS — R1310 Dysphagia, unspecified: Secondary | ICD-10-CM

## 2022-02-23 ENCOUNTER — Ambulatory Visit
Admission: RE | Admit: 2022-02-23 | Discharge: 2022-02-23 | Disposition: A | Discharge status: Home / Self Care | Payer: Medicare Other | Source: Ambulatory Visit | Attending: Gastroenterology | Admitting: Gastroenterology

## 2022-02-23 DIAGNOSIS — R1310 Dysphagia, unspecified: Secondary | ICD-10-CM

## 2022-02-23 DIAGNOSIS — R633 Feeding difficulties, unspecified: Secondary | ICD-10-CM | POA: Insufficient documentation

## 2022-02-23 DIAGNOSIS — R1314 Dysphagia, pharyngoesophageal phase: Secondary | ICD-10-CM | POA: Diagnosis not present

## 2022-02-23 NOTE — Therapy (Addendum)
[facility] [place], [place], [address] Phone: [phone]   Fax:     Modified Barium Swallow  Patient Details  Name: Sean Reyes. MRN: [id number] Date of Birth: 1940-08-13 No data recorded  Encounter Date: 02/23/2022   End of Session - 02/23/22 1716     Visit Number 1    Number of Visits 1    Date for SLP Re-Evaluation 02/23/22    SLP Start Time 28    SLP Stop Time  1430    SLP Time Calculation (min) 90 min    Activity Tolerance Patient tolerated treatment well             Past Medical History:  Diagnosis Date   Anemia    Anxiety    Arthritis    Arthritis of neck    Atrial fibrillation ([place])    Cataracts, bilateral    Complication of anesthesia    pt reports low BP's after surgery at [facility] and difficulty awakening   Depression    Diabetes ([place])    dx 6-8 yrs ago   Dysrhythmia    a-fib   GERD (gastroesophageal reflux disease)    OCC TAKES ALKA SELTZER   History of kidney stones    10-15 yrs ago   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   Hyperlipidemia    Hypertension    Myocardial infarction ([place]) 12/2020   Nocturia    S/P ablation of atrial fibrillation    Ablative therapy   Sleep apnea    CPAP    Spinal stenosis    Tachycardia, unspecified     Past Surgical History:  Procedure Laterality Date   ABLATION     ANTERIOR LAT LUMBAR FUSION N/A 06/27/2017   Procedure: Anterior Lateral Lumbar Interbody  Fusion - Lumbar Two-Lumbar Three - Lumbar Three-Lumbar Four, Posterior Lumbar Interbody Fusion Lumbar Four- Five;  Surgeon: Kary Kos, MD;  Location: [place];  Service: Neurosurgery;  Laterality: N/A;  Anterior Lateral Lumbar Interbody  Fusion - Lumbar Two-Lumbar Three - Lumbar Three-Lumbar Four, Posterior Lumbar Interbody Fusion Lumbar Four- Five   BACK SURGERY     CARDIAC CATHETERIZATION     CARDIOVERSION N/A 08/29/2018   Procedure: CARDIOVERSION;  Surgeon:  Corey Skains, MD;  Location: [facility];  Service: Cardiovascular;  Laterality: N/A;   CARDIOVERSION N/A 09/24/2018   Procedure: CARDIOVERSION;  Surgeon: Corey Skains, MD;  Location: [facility];  Service: Cardiovascular;  Laterality: N/A;   COLONOSCOPY WITH PROPOFOL N/A 10/05/2015   Procedure: COLONOSCOPY WITH PROPOFOL;  Surgeon: Lollie Sails, MD;  Location: [facility] ENDOSCOPY;  Service: Endoscopy;  Laterality: N/A;   COLONOSCOPY WITH PROPOFOL N/A 11/01/2020   Procedure: COLONOSCOPY WITH PROPOFOL;  Surgeon: Jonathon Bellows, MD;  Location: [facility] ENDOSCOPY;  Service: Gastroenterology;  Laterality: N/A;   CORONARY STENT INTERVENTION N/A 12/22/2020   Procedure: CORONARY STENT INTERVENTION;  Surgeon: [name], MD;  Location: [place] [facility];  Service: Cardiovascular;  Laterality: N/A;   ESOPHAGOGASTRODUODENOSCOPY N/A 11/01/2020   Procedure: ESOPHAGOGASTRODUODENOSCOPY (EGD);  Surgeon: Jonathon Bellows, MD;  Location: [facility] ENDOSCOPY;  Service: Gastroenterology;  Laterality: N/A;   ESOPHAGOGASTRODUODENOSCOPY (EGD) WITH PROPOFOL N/A 04/01/2018   Procedure: ESOPHAGOGASTRODUODENOSCOPY (EGD) WITH PROPOFOL;  Surgeon: Lollie Sails, MD;  Location: [facility] ENDOSCOPY;  Service: Endoscopy;  Laterality: N/A;   HERNIA REPAIR     JOINT REPLACEMENT Bilateral    hips  RT+  LEFT X2    LEFT  HEART CATH AND CORONARY ANGIOGRAPHY N/A 12/22/2020   Procedure: LEFT HEART CATH AND CORONARY ANGIOGRAPHY;  Surgeon: [name], MD;  Location: [place] [facility];  Service: Cardiovascular;  Laterality: N/A;   LEFT HEART CATH AND CORONARY ANGIOGRAPHY Left 08/23/2021   Procedure: LEFT HEART CATH AND CORONARY ANGIOGRAPHY;  Surgeon: Corey Skains, MD;  Location: [place] [facility];  Service: Cardiovascular;  Laterality: Left;   LUMBAR LAMINECTOMY/DECOMPRESSION MICRODISCECTOMY Left 09/13/2016   Procedure: Microdiscectomy - Lumbar two-three,  Lumbar three- - left;  Surgeon: Kary Kos, MD;  Location: [place];  Service: Neurosurgery;  Laterality: Left;   SPINAL CORD STIMULATOR INSERTION  07/08/2019   TONSILLECTOMY      There were no vitals filed for this visit.   Subjective: Patient behavior: (alertness, ability to follow instructions, etc.): alert, pleasant and verbally conversive engaging easily w/ Staff.  Chief complaint: dysphagia.  Pt has No neurological history; no recent dx of pneumonia. 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   OM Exam: [facility] for lingual/labial strength and ROM. No unilateral weakness. Native Dentition. Cough strong.    Objective:  Radiological Procedure: A videoflouroscopic evaluation of oral-preparatory, reflex initiation, and pharyngeal phases of the swallow was performed; as well as a screening of the upper esophageal phase.  POSTURE: upright VIEW: lateral COMPENSATORY STRATEGIES: small sips, rest breaks b/t boluses to allow for Esophageal clearing BOLUSES ADMINISTERED:  Thin Liquid: 2 via tsp; 1 single cup; 3 consecutive cup sips  Nectar-thick Liquid: 1 via tsp; 1 single cup; 2 consecutive cup sips  Honey-thick Liquid: NT  Puree: 1 trial  Mechanical Soft: 1 trial  Barium Tablet in Puree: 1 trial RESULTS OF EVALUATION: ORAL PREPARATORY PHASE: (The lips, tongue, and velum are observed for strength and coordination)       **Overall Severity Rating: WFL.   SWALLOW INITIATION/REFLEX: (The reflex is normal if "triggered" by the time the bolus reached the base of the tongue)  **Overall Severity Rating: MILD. Decreased, tight/timely epiglottic inversion/closure.  PHARYNGEAL PHASE: (Pharyngeal function is normal if the bolus shows rapid, smooth, and continuous transit through the pharynx and there is no pharyngeal residue after the swallow)  **Overall Severity Rating: [facility].  LARYNGEAL PENETRATION: (Material entering into the  laryngeal inlet/vestibule but not aspirated): x1 w/ thin liquids during challenge of MULTIPLE, consecutive swallows; majority appeared to clear w/ completion of the swallow. Throat clear present. ASPIRATION: x1 w/ FIRST tsp of thin liquids -- NO FURTHER occurred. Cough present. ESOPHAGEAL PHASE: (Screening of the upper esophagus): CP muscle area appeared slightly prominent; apparent Esophageal phase dysmotility in setting of bolus stasis in lower Cervical Esophagus (viewable below the level of the shoulders).   ASSESSMENT: Patient presents with grossly functional-mild oropharyngeal swallowing for age, and in setting of Baseline h/o Esophageal phase Dysmotility. ANY Esophageal phase deficits can impact the oropharyngeal phase of swallowing. Oral stage is characterized by adequate lip closure, bolus preparation and containment, and anterior to posterior transit. Swallow initiation occurs primarily at the level of the valleculae w/ inconsistent spillage from the valleculae during consecutive sips of thin liquids. Pharyngeal stage is also noted for slightly reduced tongue base retraction (intermittently, and likely age-related), adequate hyolaryngeal excursion, and adequate pharyngeal constriction. Pharyngeal stripping wave is complete. Epiglottic inversion is complete w/ the majority of trials during the study, however, noted decreased timely/tight epiglottic inversion/closure during challenge of Multiple sips resulting in laryngeal  penetration x1 during. The majority of penetration/coating along underneath side of epiglottis appeared to clear w/ completion of the swallow and subsequent swallows -- throat clear was also present during this occurrence. No aspiration occurred during all trials except on the First tsp bolus presented - an immediate, strong cough response followed. Any slight-min valleculae residue occurring w/ bolus consistencies immediately cleared w/ his independent, dry swallow b/t  trials. Amplitude/duration of cricopharyngeus opening appeared [facility]. There was adequate/complete clearance through the upper cervical Esophagus. An Esophageal sweep was performed in the upright, lateral position which revealed bolus stasis in the lower cervical Esophagus(viewable at/below the level of the shoulders at the height of the swallowing). A 13 mm barium tablet cleared the pharyngoesophageal areas viewable. Of note, the cricopharyngeus muscle area appeared slightly prominent. 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 Pt was educated on the results of the study immediately after.     PLAN/RECOMMENDATIONS:  A. Diet: Regular diet(small cut, moistened foods); thin liquids by Cup, if any overt coughing noted via straw use; PILLS WHOLE IN PUREE.  B. Swallowing Precautions: aspiration precautions including Single sips and Dry swallowing b/t bites/sips intermittently; REFLUX precautions  C. Recommended consultation to: strongly recommend f/u w/ GI for ongoing assessment of Esophageal phase motility, management, and Education. Dietician f/u for support as needed.  D. Therapy recommendations: None at this time. Recommend ongoing monitoring w/ PCP of Pulmonary status and/or any increased s/s of pharyngoesophageal phase dysphagia -- f/u w/ GI for Education on the impact of Esophageal phase Dysmotility on the pharyngeal phase of swallowing, and overall oral intake.  E. Results and recommendations were discussed w/ patient and video viewed, questions answered. Discussed/gave handout re: his aspiration precautions and strategies targeting pharyngoesophageal phase dysphagia including Rest Breaks during meals and alternating food/liquid to aid Esophageal clearing; Pills WHOLE in Puree for safer swallowing. Pt agreed verbally.   Pharyngoesophageal dysphagia  Dysphagia, unspecified type - Plan: DG SWALLOW FUNC OP MEDICARE SPEECH PATH, DG SWALLOW FUNC OP MEDICARE SPEECH PATH  Feeding difficulties, unspecified -  Plan: DG SWALLOW FUNC OP MEDICARE SPEECH PATH, DG SWALLOW FUNC OP MEDICARE SPEECH PATH        Problem List Patient Active Problem List   Diagnosis Date Noted   CPAP use counseling 01/23/2022   COPD (chronic obstructive pulmonary disease) ([place]) 10/31/2021   Shortness of breath on exertion 08/04/2021   Diabetes mellitus with proteinuria ([place]) 04/04/2021   GERD without esophagitis 04/02/2021   Coronary artery disease 12/28/2020   History of non-ST elevation myocardial infarction (NSTEMI) 12/21/2020   Pain in right shin 10/25/2020   Peripheral vascular disease ([place]) 08/06/2020   Chronic pain syndrome 07/06/2020   Cervical facet joint syndrome 04/08/2020   Spinal cord stimulator status 12/11/2019   Elevated TSH 08/20/2019   CKD (chronic kidney disease) stage 3, GFR 30-59 ml/min ([place]) 01/19/2019   Acquired thrombophilia ([place]) 01/19/2019   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   Postlaminectomy syndrome, lumbar region 01/16/2019   History of fusion of lumbar spine (L2-L5) 01/16/2019   Chronic radicular lumbar pain 01/16/2019   HNP (herniated nucleus pulposus), lumbar 04/29/2018   Advanced care planning/counseling discussion 09/28/2016   Spinal stenosis, lumbar region, 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/13/2016   Hyperlipidemia associated with type 2 diabetes mellitus ([place]) 07/14/2015   Symptomatic anemia 06/30/2015   Benign prostatic hyperplasia without lower urinary tract symptoms 06/02/2015   OSA (obstructive sleep apnea) 03/23/2015   Hypertension associated with diabetes ([place]) 09/28/2014   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[place]) 09/28/2014  H/O prior ablation treatment 10/19/2011   AF (paroxysmal atrial fibrillation) ([place]) 10/19/2011        Orinda Kenner, MS, CCC-SLP Speech Language Pathologist Rehab Services; [place] [phone] ([address] [place], [place] 02/23/2022, 5:17 PM  [place], [place], [address] Phone: [phone]   Fax:     Name: Sean Reyes. MRN: [id number] Date of Birth: Sep 03, 1940

## 2022-02-27 ENCOUNTER — Ambulatory Visit
Admission: RE | Admit: 2022-02-27 | Discharge: 2022-02-27 | Disposition: A | Discharge status: Home / Self Care | Payer: Medicare Other | Source: Ambulatory Visit | Attending: Gastroenterology | Admitting: Gastroenterology

## 2022-02-27 DIAGNOSIS — R1310 Dysphagia, unspecified: Secondary | ICD-10-CM | POA: Diagnosis not present

## 2022-02-27 DIAGNOSIS — K449 Diaphragmatic hernia without obstruction or gangrene: Secondary | ICD-10-CM | POA: Diagnosis not present

## 2022-02-27 DIAGNOSIS — K219 Gastro-esophageal reflux disease without esophagitis: Secondary | ICD-10-CM | POA: Diagnosis not present

## 2022-03-20 ENCOUNTER — Encounter: Payer: Self-pay | Admitting: Internal Medicine

## 2022-03-21 ENCOUNTER — Other Ambulatory Visit: Payer: Self-pay | Admitting: Nurse Practitioner

## 2022-03-22 NOTE — Telephone Encounter (Signed)
Requested Prescriptions  Pending Prescriptions Disp Refills   rosuvastatin (CRESTOR) 5 MG tablet [Pharmacy Med Name: ROSUVASTATIN CALCIUM 5 MG TAB] 90 tablet 0    Sig: Take 1 tablet (5 mg total) by mouth daily.     Cardiovascular:  Antilipid - Statins 2 Failed - 03/21/2022 11:37 AM      Failed - Lipid Panel in normal range within the last 12 months    Cholesterol, Total  Date Value Ref Range Status  01/02/2022 103 100 - 199 mg/dL Final   LDL Chol Calc (NIH)  Date Value Ref Range Status  01/02/2022 44 0 - 99 mg/dL Final   HDL  Date Value Ref Range Status  01/02/2022 43 >39 mg/dL Final   Triglycerides  Date Value Ref Range Status  01/02/2022 75 0 - 149 mg/dL Final         Passed - Cr in normal range and within 360 days    Creatinine  Date Value Ref Range Status  07/01/2012 1.27 0.60 - 1.30 mg/dL Final   Creatinine, Ser  Date Value Ref Range Status  01/02/2022 1.22 0.76 - 1.27 mg/dL Final         Passed - Patient is not pregnant      Passed - Valid encounter within last 12 months    Recent Outpatient Visits           2 months ago Diabetes mellitus with proteinuria ([place])   [place], [place] T, NP   4 months ago Chronic obstructive pulmonary disease, unspecified COPD type ([place])   [place], Henrine Screws T, NP   5 months ago Diabetes mellitus with proteinuria ([place])   [place], Henrine Screws T, NP   6 months ago Shortness of breath   [place], NP   7 months ago Fatigue, unspecified type   [place], [name], NP       Future Appointments             Tomorrow Wellington Hampshire, MD [place] at [place]   In 1 week [place], [name], NP [place], [place]   In 2 months Jonathon Bellows, [place] [facility] at [facility]

## 2022-03-23 ENCOUNTER — Ambulatory Visit: Payer: Medicare Other | Attending: Cardiovascular Disease | Admitting: Cardiovascular Disease

## 2022-03-23 ENCOUNTER — Encounter: Payer: Self-pay | Admitting: Cardiovascular Disease

## 2022-03-23 VITALS — BP 120/60 | HR 76 | Ht 73.0 in | Wt 185.4 lb

## 2022-03-23 DIAGNOSIS — I480 Paroxysmal atrial fibrillation: Secondary | ICD-10-CM | POA: Diagnosis not present

## 2022-03-23 DIAGNOSIS — E785 Hyperlipidemia, unspecified: Secondary | ICD-10-CM

## 2022-03-23 DIAGNOSIS — I2510 Atherosclerotic heart disease of native coronary artery without angina pectoris: Secondary | ICD-10-CM | POA: Diagnosis not present

## 2022-03-23 DIAGNOSIS — R0609 Other forms of dyspnea: Secondary | ICD-10-CM | POA: Diagnosis not present

## 2022-03-23 DIAGNOSIS — I10 Essential (primary) hypertension: Secondary | ICD-10-CM

## 2022-03-23 NOTE — Progress Notes (Signed)
Cardiology Office Note   Date:  03/23/2022   ID:  Sean Malek., DOB 1940/07/09, MRN [id number]  PCP:  [name], NP  Cardiologist:   Kathlyn [place], MD   Chief Complaint  Patient presents with   PHQ-9 4 Week Follow-up    Patient c/o shortness of breath. Medications reviewed by the patient verbally.       History of Present Illness: Sean Reyes. is a 82 y.o. male who presents for a follow-up visit regarding paroxysmal atrial fibrillation, PVCs and coronary artery disease.   Other medical problems include essential hypertension, hyperlipidemia, type 2 diabetes and chronic kidney disease. The patient is severely limited by back pain which has been chronic.  He had 3 back surgeries He presented in November 2022 with non-ST elevation myocardial infarction.  Cardiac catheterization was done which showed severe proximal RCA stenosis which was treated successfully with PCI and drug-eluting stent placement.  He had repeat cardiac catheterization in July 2023 which showed patent RCA stent with no significant restenosis.  There was borderline 60% stenosis in the distal LAD and mild distal left circumflex disease.    He has known history of paroxysmal atrial fibrillation for many years.  He underwent an ablation procedure at [facility] many years ago and reports having a complication during the procedure that necessitated aborting the procedure.  He subsequently had ablation at [facility] by Dr. Marcello Moores with improvement.   Upon his initial evaluation with me in July 2023, he was on propafenone which was discontinued given his coronary artery disease.  I started him on small dose Toprol instead.  He was seen recently for bradycardia and thus the dose of Toprol was decreased to 12.5 mg once daily.  He has been doing well since then with no chest pain.  He continues to be limited by shortness of breath and chronic low back pain.  Past Medical History:  Diagnosis Date   Anemia    Anxiety     Arthritis    Arthritis of neck    Atrial fibrillation (HCC)    Cataracts, bilateral    Complication of anesthesia    pt reports low BP's after surgery at [facility] and difficulty awakening   Depression    Diabetes ([place])    dx 6-8 yrs ago   Dysrhythmia    a-fib   GERD (gastroesophageal reflux disease)    OCC TAKES ALKA SELTZER   History of kidney stones    10-15 yrs ago   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   Hyperlipidemia    Hypertension    Myocardial infarction ([place]) 12/2020   Nocturia    S/P ablation of atrial fibrillation    Ablative therapy   Sleep apnea    CPAP    Spinal stenosis    Tachycardia, unspecified     Past Surgical History:  Procedure Laterality Date   ABLATION     ANTERIOR LAT LUMBAR FUSION N/A 06/27/2017   Procedure: Anterior Lateral Lumbar Interbody  Fusion - Lumbar Two-Lumbar Three - Lumbar Three-Lumbar Four, Posterior Lumbar Interbody Fusion Lumbar Four- Five;  Surgeon: Kary Kos, MD;  Location: [place];  Service: Neurosurgery;  Laterality: N/A;  Anterior Lateral Lumbar Interbody  Fusion - Lumbar Two-Lumbar Three - Lumbar Three-Lumbar Four, Posterior Lumbar Interbody Fusion Lumbar Four- Five   BACK SURGERY     CARDIAC CATHETERIZATION     CARDIOVERSION N/A 08/29/2018   Procedure: CARDIOVERSION;  Surgeon: Corey Skains, MD;  Location: [facility];  Service: Cardiovascular;  Laterality: N/A;   CARDIOVERSION N/A 09/24/2018   Procedure: CARDIOVERSION;  Surgeon: Corey Skains, MD;  Location: [facility];  Service: Cardiovascular;  Laterality: N/A;   COLONOSCOPY WITH PROPOFOL N/A 10/05/2015   Procedure: COLONOSCOPY WITH PROPOFOL;  Surgeon: Lollie Sails, MD;  Location: [facility] ENDOSCOPY;  Service: Endoscopy;  Laterality: N/A;   COLONOSCOPY WITH PROPOFOL N/A 11/01/2020   Procedure: COLONOSCOPY WITH PROPOFOL;  Surgeon: Jonathon Bellows, MD;  Location: [facility] ENDOSCOPY;  Service: Gastroenterology;  Laterality: N/A;   CORONARY STENT INTERVENTION N/A  12/22/2020   Procedure: CORONARY STENT INTERVENTION;  Surgeon: [name], MD;  Location: [place] [facility];  Service: Cardiovascular;  Laterality: N/A;   ESOPHAGOGASTRODUODENOSCOPY N/A 11/01/2020   Procedure: ESOPHAGOGASTRODUODENOSCOPY (EGD);  Surgeon: Jonathon Bellows, MD;  Location: [facility] ENDOSCOPY;  Service: Gastroenterology;  Laterality: N/A;   ESOPHAGOGASTRODUODENOSCOPY (EGD) WITH PROPOFOL N/A 04/01/2018   Procedure: ESOPHAGOGASTRODUODENOSCOPY (EGD) WITH PROPOFOL;  Surgeon: Lollie Sails, MD;  Location: [facility] ENDOSCOPY;  Service: Endoscopy;  Laterality: N/A;   HERNIA REPAIR     JOINT REPLACEMENT Bilateral    hips  RT+  LEFT X2    LEFT HEART CATH AND CORONARY ANGIOGRAPHY N/A 12/22/2020   Procedure: LEFT HEART CATH AND CORONARY ANGIOGRAPHY;  Surgeon: [name], MD;  Location: [place] [facility];  Service: Cardiovascular;  Laterality: N/A;   LEFT HEART CATH AND CORONARY ANGIOGRAPHY Left 08/23/2021   Procedure: LEFT HEART CATH AND CORONARY ANGIOGRAPHY;  Surgeon: Corey Skains, MD;  Location: [place] [facility];  Service: Cardiovascular;  Laterality: Left;   LUMBAR LAMINECTOMY/DECOMPRESSION MICRODISCECTOMY Left 09/13/2016   Procedure: Microdiscectomy - Lumbar two-three,  Lumbar three- - left;  Surgeon: Kary Kos, MD;  Location: [place];  Service: Neurosurgery;  Laterality: Left;   SPINAL CORD STIMULATOR INSERTION  07/08/2019   TONSILLECTOMY       Current Outpatient Medications  Medication Sig Dispense Refill   amLODipine (NORVASC) 5 MG tablet Take 1 tablet (5 mg total) by mouth at bedtime. 90 tablet 3   benazepril (LOTENSIN) 20 MG tablet Take 1 tablet (20 mg total) by mouth daily. 90 tablet 3   clopidogrel (PLAVIX) 75 MG tablet Take 75 mg by mouth daily.     cyanocobalamin 1000 MCG tablet Take by mouth.     ELIQUIS 5 MG TABS tablet Take 1 tablet (5 mg total) by mouth 2 (two) times daily. 180 tablet 1   ferrous sulfate 325 (65 FE) MG EC tablet Take 325 mg by  mouth daily with breakfast.     finasteride (PROSCAR) 5 MG tablet Take 1 tablet (5 mg total) by mouth daily. 90 tablet 2   isosorbide mononitrate (IMDUR) 30 MG 24 hr tablet Take 30 mg by mouth daily.     metFORMIN (GLUCOPHAGE) 500 MG tablet Take 1 tablet (500 mg total) by mouth 2 (two) times daily with a meal. 180 tablet 4   metoprolol succinate (TOPROL XL) 25 MG 24 hr tablet Take 0.5 tablets (12.5 mg total) by mouth daily. 45 tablet 1   omeprazole (PRILOSEC) 40 MG capsule Take 1 capsule (40 mg total) by mouth in the morning and at bedtime. 180 capsule 3   rosuvastatin (CRESTOR) 5 MG tablet Take 1 tablet (5 mg total) by mouth daily. 90 tablet 0   solifenacin (VESICARE) 5 MG tablet Take 5 mg by mouth daily.     tamsulosin (FLOMAX) 0.4 MG CAPS capsule Take 0.8 mg by mouth daily.  No current facility-administered medications for this visit.    Allergies:   Levaquin [levofloxacin in d5w], Shellfish allergy, Amiodarone, and Adhesive [tape]    Social History:  The patient  reports that he has never smoked. He has never used smokeless tobacco. He reports that he does not drink alcohol and does not use drugs.   Family History:  The patient's family history includes Brain cancer in his mother.    ROS:  Please see the history of present illness.   Otherwise, review of systems are positive for none.   All other systems are reviewed and negative.    PHYSICAL EXAM: VS:  BP 120/60 (BP Location: Left Arm, Patient Position: Sitting, Cuff Size: Normal)   Pulse 76   Ht '6\' 1"'$  (1.854 m)   Wt 185 lb 6 oz (84.1 kg)   SpO2 97%   BMI 24.46 kg/m  , BMI Body mass index is 24.46 kg/m. GEN: Well nourished, well developed, in no acute distress  HEENT: normal  Neck: no JVD, carotid bruits, or masses Cardiac: RRR; no murmurs, rubs, or gallops,no edema  Respiratory:  clear to auscultation bilaterally, normal work of breathing GI: soft, nontender, nondistended, + BS MS: no deformity or atrophy  Skin: warm  and dry, no rash Neuro:  Strength and sensation are intact Psych: euthymic mood, full affect Distal pulses are palpable.  Femoral pulses are +2.   EKG:  EKG is ordered today. The ekg ordered today demonstrates sinus rhythm with PACs.   Recent Labs: 10/03/2021: TSH 3.710 10/07/2021: B Natriuretic Peptide 74.1; Hemoglobin 11.5; Platelets 175 01/02/2022: ALT 11; BUN 17; Creatinine, Ser 1.22; Magnesium 1.6; Potassium 4.1; Sodium 138    Lipid Panel    Component Value Date/Time   CHOL 103 01/02/2022 1110   TRIG 75 01/02/2022 1110   HDL 43 01/02/2022 1110   CHOLHDL 2.6 12/21/2020 0652   VLDL 13 12/21/2020 0652   LDLCALC 44 01/02/2022 1110      Wt Readings from Last 3 Encounters:  03/23/22 185 lb 6 oz (84.1 kg)  02/17/22 184 lb 4 oz (83.6 kg)  02/16/22 186 lb 9.6 oz (84.6 kg)          09/08/2021    8:39 AM  PAD Screen  Previous PAD dx? No  Previous surgical procedure? Yes  Pain with walking? No  Feet/toe relief with dangling? No  Painful, non-healing ulcers? No  Extremities discolored? No      ASSESSMENT AND PLAN:  1.  Coronary artery disease involving native coronary arteries: Status post myocardial infarction and stent placement to the right coronary artery in November 2022.  Clopidogrel was stopped in November especially that he is on anticoagulation with Eliquis.  He does have borderline disease in the distal LAD but the vessel is too small in that area..  2.  Paroxysmal atrial fibrillation: He is status post ablation and is maintaining in sinus rhythm.  Propafenone was discontinued last year.  He had recent bradycardia that improved after decreasing the dose of Toprol to 12.5 mg once daily.  Continue anticoagulation with Eliquis 5 mg twice daily.  I reviewed his recent labs which showed a creatinine of 1.22.    3.  Essential hypertension: Blood pressures controlled on current medications.    4.  Hyperlipidemia: Continue rosuvastatin with a target LDL of less than  70.I reviewed his recent lipid profile which showed an LDL of 44  5.  Type 2 diabetes: Consider an SGLT2 inhibitor given his cardiac history.  6.  Exertional dyspnea: Likely due to underlying COPD and physical deconditioning.    Disposition:   FU with me in 6 months  Signed,  Kathlyn [place], MD  03/23/2022 11:07 AM    [place]

## 2022-03-23 NOTE — Patient Instructions (Signed)
Medication Instructions:  No changes *If you need a refill on your cardiac medications before your next appointment, please call your pharmacy*   Lab Work: None ordered If you have labs (blood work) drawn today and your tests are completely normal, you will receive your results only by: MyChart Message (if you have MyChart) OR A paper copy in the mail If you have any lab test that is abnormal or we need to change your treatment, we will call you to review the results.   Testing/Procedures: None ordered   Follow-Up: At [facility], you and your health needs are our priority.  As part of our continuing mission to provide you with exceptional heart care, we have created designated Provider Care Teams.  These Care Teams include your primary Cardiologist (physician) and Advanced Practice Providers (APPs -  Physician Assistants and Nurse Practitioners) who all work together to provide you with the care you need, when you need it.  We recommend signing up for the patient portal called "MyChart".  Sign up information is provided on this After Visit Summary.  MyChart is used to connect with patients for Virtual Visits (Telemedicine).  Patients are able to view lab/test results, encounter notes, upcoming appointments, etc.  Non-urgent messages can be sent to your provider as well.   To learn more about what you can do with MyChart, go to [email or web address].    Your next appointment:   6 month(s)  Provider:   You may see Muhammad Arida, MD or one of the following Advanced Practice Providers on your designated Care Team:   Christopher Berge, NP Ryan Dunn, PA-C Cadence Furth, PA-C Sheri Hammock, NP    

## 2022-03-24 ENCOUNTER — Ambulatory Visit: Payer: Medicare Other | Admitting: Cardiovascular Disease

## 2022-04-02 NOTE — Patient Instructions (Incomplete)
Diabetes Mellitus Basics  Diabetes mellitus, or diabetes, is a long-term (chronic) disease. It occurs when the body does not properly use sugar (glucose) that is released from food after you eat. Diabetes mellitus may be caused by one or both of these problems: Your pancreas does not make enough of a hormone called insulin. Your body does not react in a normal way to the insulin that it makes. Insulin lets glucose enter cells in your body. This gives you energy. If you have diabetes, glucose cannot get into cells. This causes high blood glucose (hyperglycemia). How to treat and manage diabetes You may need to take insulin or other diabetes medicines daily to keep your glucose in balance. If you are prescribed insulin, you will learn how to give yourself insulin by injection. You may need to adjust the amount of insulin you take based on the foods that you eat. You will need to check your blood glucose levels using a glucose monitor as told by your health care provider. The readings can help determine if you have low or high blood glucose. Generally, you should have these blood glucose levels: Before meals (preprandial): 80-130 mg/dL (4.4-7.2 mmol/L). After meals (postprandial): below 180 mg/dL (10 mmol/L). Hemoglobin A1c (HbA1c) level: less than 7%. Your health care provider will set treatment goals for you. Keep all follow-up visits. This is important. Follow these instructions at home: Diabetes medicines Take your diabetes medicines every day as told by your health care provider. List your diabetes medicines here: Name of medicine: ______________________________ Amount (dose): _______________ Time (a.m./p.m.): _______________ Notes: ___________________________________ Name of medicine: ______________________________ Amount (dose): _______________ Time (a.m./p.m.): _______________ Notes: ___________________________________ Name of medicine: ______________________________ Amount (dose):  _______________ Time (a.m./p.m.): _______________ Notes: ___________________________________ Insulin If you use insulin, list the types of insulin you use here: Insulin type: ______________________________ Amount (dose): _______________ Time (a.m./p.m.): _______________Notes: ___________________________________ Insulin type: ______________________________ Amount (dose): _______________ Time (a.m./p.m.): _______________ Notes: ___________________________________ Insulin type: ______________________________ Amount (dose): _______________ Time (a.m./p.m.): _______________ Notes: ___________________________________ Insulin type: ______________________________ Amount (dose): _______________ Time (a.m./p.m.): _______________ Notes: ___________________________________ Insulin type: ______________________________ Amount (dose): _______________ Time (a.m./p.m.): _______________ Notes: ___________________________________ Managing blood glucose  Check your blood glucose levels using a glucose monitor as told by your health care provider. Write down the times that you check your glucose levels here: Time: _______________ Notes: ___________________________________ Time: _______________ Notes: ___________________________________ Time: _______________ Notes: ___________________________________ Time: _______________ Notes: ___________________________________ Time: _______________ Notes: ___________________________________ Time: _______________ Notes: ___________________________________  Low blood glucose Low blood glucose (hypoglycemia) is when glucose is at or below 70 mg/dL (3.9 mmol/L). Symptoms may include: Feeling: Hungry. Sweaty and clammy. Irritable or easily upset. Dizzy. Sleepy. Having: A fast heartbeat. A headache. A change in your vision. Numbness around the mouth, lips, or tongue. Having trouble with: Moving (coordination). Sleeping. Treating low blood glucose To treat low blood  glucose, eat or drink something containing sugar right away. If you can think clearly and swallow safely, follow the 15:15 rule: Take 15 grams of a fast-acting carb (carbohydrate), as told by your health care provider. Some fast-acting carbs are: Glucose tablets: take 3-4 tablets. Hard candy: eat 3-5 pieces. Fruit juice: drink 4 oz (120 mL). Regular (not diet) soda: drink 4-6 oz (120-180 mL). Honey or sugar: eat 1 Tbsp (15 mL). Check your blood glucose levels 15 minutes after you take the carb. If your glucose is still at or below 70 mg/dL (3.9 mmol/L), take 15 grams of a carb again. If your glucose does not go above 70 mg/dL (3.9 mmol/L) after   3 tries, get help right away. After your glucose goes back to normal, eat a meal or a snack within 1 hour. Treating very low blood glucose If your glucose is at or below 54 mg/dL (3 mmol/L), you have very low blood glucose (severe hypoglycemia). This is an emergency. Do not wait to see if the symptoms will go away. Get medical help right away. Call your local emergency services (911 in the [place] Do not drive yourself to the hospital. Questions to ask your health care provider Should I talk with a diabetes educator? What equipment will I need to care for myself at home? What diabetes medicines do I need? When should I take them? How often do I need to check my blood glucose levels? What number can I call if I have questions? When is my follow-up visit? Where can I find a support group for people with diabetes? Where to find more information American Diabetes Association: www.diabetes.org Association of Diabetes Care and Education Specialists: www.diabeteseducator.org Contact a health care provider if: Your blood glucose is at or above 240 mg/dL (13.3 mmol/L) for 2 days in a row. You have been sick or have had a fever for 2 days or more, and you are not getting better. You have any of these problems for more than 6 hours: You cannot eat or  drink. You feel nauseous. You vomit. You have diarrhea. Get help right away if: Your blood glucose is lower than 54 mg/dL (3 mmol/L). You get confused. You have trouble thinking clearly. You have trouble breathing. These symptoms may represent a serious problem that is an emergency. Do not wait to see if the symptoms will go away. Get medical help right away. Call your local emergency services (911 in the [place] Do not drive yourself to the hospital. Summary Diabetes mellitus is a chronic disease that occurs when the body does not properly use sugar (glucose) that is released from food after you eat. Take insulin and diabetes medicines as told. Check your blood glucose every day, as often as told. Keep all follow-up visits. This is important. This information is not intended to replace advice given to you by your health care provider. Make sure you discuss any questions you have with your health care provider. Document Revised: 06/03/2019 Document Reviewed: 06/03/2019 Elsevier Patient Education  2023 [employer]  

## 2022-04-04 ENCOUNTER — Ambulatory Visit (INDEPENDENT_AMBULATORY_CARE_PROVIDER_SITE_OTHER): Payer: Medicare Other | Admitting: Nurse Practitioner

## 2022-04-04 ENCOUNTER — Encounter: Payer: Self-pay | Admitting: Nurse Practitioner

## 2022-04-04 VITALS — BP 130/76 | HR 73 | Temp 97.9°F | Ht 72.99 in | Wt 186.3 lb

## 2022-04-04 DIAGNOSIS — E785 Hyperlipidemia, unspecified: Secondary | ICD-10-CM | POA: Diagnosis not present

## 2022-04-04 DIAGNOSIS — R809 Proteinuria, unspecified: Secondary | ICD-10-CM | POA: Diagnosis not present

## 2022-04-04 DIAGNOSIS — I739 Peripheral vascular disease, unspecified: Secondary | ICD-10-CM

## 2022-04-04 DIAGNOSIS — E1143 Type 2 diabetes mellitus with diabetic autonomic (poly)neuropathy: Secondary | ICD-10-CM | POA: Diagnosis not present

## 2022-04-04 DIAGNOSIS — M961 Postlaminectomy syndrome, not elsewhere classified: Secondary | ICD-10-CM

## 2022-04-04 DIAGNOSIS — D6869 Other thrombophilia: Secondary | ICD-10-CM | POA: Diagnosis not present

## 2022-04-04 DIAGNOSIS — I25110 Atherosclerotic heart disease of native coronary artery with unstable angina pectoris: Secondary | ICD-10-CM | POA: Diagnosis not present

## 2022-04-04 DIAGNOSIS — E1159 Type 2 diabetes mellitus with other circulatory complications: Secondary | ICD-10-CM

## 2022-04-04 DIAGNOSIS — I480 Paroxysmal atrial fibrillation: Secondary | ICD-10-CM

## 2022-04-04 DIAGNOSIS — N400 Enlarged prostate without lower urinary tract symptoms: Secondary | ICD-10-CM

## 2022-04-04 DIAGNOSIS — M79661 Pain in right lower leg: Secondary | ICD-10-CM

## 2022-04-04 DIAGNOSIS — N1831 Chronic kidney disease, stage 3a: Secondary | ICD-10-CM | POA: Diagnosis not present

## 2022-04-04 DIAGNOSIS — R7989 Other specified abnormal findings of blood chemistry: Secondary | ICD-10-CM

## 2022-04-04 DIAGNOSIS — R0602 Shortness of breath: Secondary | ICD-10-CM

## 2022-04-04 DIAGNOSIS — G894 Chronic pain syndrome: Secondary | ICD-10-CM

## 2022-04-04 DIAGNOSIS — E1169 Type 2 diabetes mellitus with other specified complication: Secondary | ICD-10-CM | POA: Diagnosis not present

## 2022-04-04 DIAGNOSIS — Z9689 Presence of other specified functional implants: Secondary | ICD-10-CM

## 2022-04-04 DIAGNOSIS — J449 Chronic obstructive pulmonary disease, unspecified: Secondary | ICD-10-CM

## 2022-04-04 DIAGNOSIS — E1129 Type 2 diabetes mellitus with other diabetic kidney complication: Secondary | ICD-10-CM | POA: Diagnosis not present

## 2022-04-04 DIAGNOSIS — I152 Hypertension secondary to endocrine disorders: Secondary | ICD-10-CM | POA: Diagnosis not present

## 2022-04-04 MED ORDER — LIDOCAINE 5 % EX PTCH: 1.0000 | MEDICATED_PATCH | CUTANEOUS | 2 refills | Status: DC

## 2022-04-04 MED ORDER — METFORMIN HCL 500 MG PO TABS: 500.0000 mg | ORAL_TABLET | Freq: Two times a day (BID) | ORAL | 4 refills | Status: DC

## 2022-04-04 MED ORDER — ROSUVASTATIN CALCIUM 5 MG PO TABS: 5.0000 mg | ORAL_TABLET | Freq: Every day | ORAL | 4 refills | Status: DC

## 2022-04-04 MED ORDER — ELIQUIS 5 MG PO TABS: 5.0000 mg | ORAL_TABLET | Freq: Two times a day (BID) | ORAL | 4 refills | Status: DC

## 2022-04-04 NOTE — Assessment & Plan Note (Signed)
Chronic, ongoing and followed by urology.  Continue this collaboration and current regimen as ordered by them.  He would prefer to see a [facility] provider and local for these issues, referral placed to Dr. Erlene Quan.

## 2022-04-04 NOTE — Assessment & Plan Note (Signed)
Ongoing, continue collaboration with cardiology and current medication regimen.  Recent notes reviewed.

## 2022-04-04 NOTE — Assessment & Plan Note (Signed)
Chronic, ongoing.  Followed by neurosurgery and pain management as needed.  Continue this collaboration and current regimen. Lidocaine patches sent in.

## 2022-04-04 NOTE — Assessment & Plan Note (Signed)
Stable at this time with change in cardiac medications, continue collaboration with cardiology and pulmonary.

## 2022-04-04 NOTE — Assessment & Plan Note (Addendum)
Chronic, ongoing with A1c 6.5% last visit, recheck today, and urine ALB 80 (February 2023), recheck today.  Significant decreased sensation bilateral feet, monitor closely for wounds and falls.  Continue current diabetes medication regimen and adjust as needed. He did not get benefit from Lyrica or Gabapentin, will continue collaboration with neurology and pain clinic as needed.  Continue to monitor BS daily and document for visits.

## 2022-04-04 NOTE — Assessment & Plan Note (Signed)
Patient on Eliquis with A-fib, monitor CBC regularly.

## 2022-04-04 NOTE — Assessment & Plan Note (Signed)
Chronic, ongoing with A1c 6.5% recent visit, recheck today, and urine ALB 80 (February 2023) - recheck today.  Continues to have stable BS control.  Will continue current medication regimen and adjust as needed.  Recommend he check BS at least 3 times daily.  Continue Benazepril for kidney protection.

## 2022-04-04 NOTE — Assessment & Plan Note (Signed)
Chronic, ongoing.  Continue to monitor closely and refer to nephrology if decline.  Continue Benazepril for kidney protection and proteinuria.  Renal dose medications as needed based on labs.

## 2022-04-04 NOTE — Assessment & Plan Note (Signed)
Chronic pain related to this.  Continue collaboration with pain management as needed.  Lidocaine patches sent for pain.

## 2022-04-04 NOTE — Assessment & Plan Note (Signed)
Continue collaboration with pain management.

## 2022-04-04 NOTE — Assessment & Plan Note (Signed)
Chronic, stable.  Followed by pulmonary at this time, continue this collaboration.  He is not using inhalers.  Reports some improvement in SOB with change in cardiac medications.

## 2022-04-04 NOTE — Assessment & Plan Note (Signed)
Chronic, stable.  To bilateral lower extremity.  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  Continue ASA daily and monitor closely for any wounds.  Return to vascular as needed.

## 2022-04-04 NOTE — Assessment & Plan Note (Signed)
Ongoing for several years, has seen various specialists.  Consider [facility] dermatology in future.  At this time recommend Capsaicin cream nightly.

## 2022-04-04 NOTE — Progress Notes (Signed)
BP 130/76   Pulse 73   Temp 97.9 F (36.6 C) (Oral)   Ht 6' 0.99" (1.854 m)   Wt 186 lb 4.8 oz (84.5 kg)   SpO2 98%   BMI 24.58 kg/m    Subjective:    Patient ID: Sean Reyes., male    DOB: 12/13/1940, 82 y.o.   MRN: [id number]  HPI: Sean Reyes. is a 82 y.o. male  Chief Complaint  Patient presents with   Diabetes   Hypertension   Hyperlipidemia   Atrial Fibrillation    Patient states that he no longer has, he now has PVC's instead    Gastroesophageal Reflux   Back Pain   DIABETES WITH NEUROPATHY A1c November 6.5%.  Continues Metformin 500 MG BID. Hypoglycemic episodes:no Polydipsia/polyuria: no Visual disturbance: no Chest pain: no Paresthesias: no Glucose Monitoring: yes  Accucheck frequency: 2-3 times a week  Fasting glucose: 130 on average  Post prandial:  Evening:  Before meals: Taking Insulin?: no  Long acting insulin:  Short acting insulin: Blood Pressure Monitoring: daily Retinal Examination: Up To Date -- Dr. Atilano Median in [place] Exam: Up to Date Pneumovax: Up to Date Influenza: Up to Date Aspirin: no    HYPERTENSION / HYPERLIPIDEMIA Last saw cardiology on 03/23/22, there was reduction in Toprol to 12.5 MG daily recently which was beneficial to him.  Taking Metoprolol XL, Imdur, and Benazepril. Taking Rosuvastatin 5 MG for HLD.  Last saw pulmonary on 11/28/21 --  no longer uses inhalers.  History of elevation in TSH on past labs which we monitor.  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 Satisfied with current treatment? yes Duration of hypertension: chronic BP monitoring frequency: BID BP range: 120-130/70 range on average BP medication side effects: no Duration of hyperlipidemia: chronic Cholesterol medication side effects: no Cholesterol supplements: none Medication compliance: good compliance Aspirin: no Recent stressors: no Recurrent headaches: no Visual changes: no Palpitations: no Dyspnea: occasional - if  really active or back is hurting Chest pain: no Lower extremity edema: no Dizzy/lightheaded: no  ATRIAL FIBRILLATION Followed by cardiology as above.  Currently stable with PVCs only. Atrial fibrillation status: stable Satisfied with current treatment: yes  Medication side effects:  no Medication compliance: good compliance Etiology of atrial fibrillation: unknown Palpitations:  no Chest pain:  no Dyspnea on exertion:  no Orthopnea:  no Syncope:  no Edema:  no Ventricular rate control: B-blocker Anti-coagulation: long acting   CHRONIC KIDNEY DISEASE Continues Tamsulosin and Vesicare.  He is seeing urology and last saw 05/25/21 -- seeing [facility] provider, but would like to be closer and in [facility] system.   CKD status: stable Medications renally dose: yes Previous renal evaluation: no Pneumovax:  Up to Date Influenza Vaccine:  Up to Date   CHRONIC PAIN  Has pain stimulator -- he recently adjust levels due to some increase lower back pain starting recently.    Has ongoing neuropathy issues -- difficulty with balance due to this. Neurosurgery visit last on 06/10/21 for ongoing weakness/neuropathy.  Have tried Gabapentin, Lyrica without benefit.  Neurology on 04/28/21 - they reported no further interventions available.  Has area to right shin that is painful after hitting against trailer hitch many years ago, then previous PCP cauterized area -- has ongoing nerve pain to area -- which he reports have been worse a bit lately.  Has seen multiple specialists for this with no improvement. Pain control status: stable Duration: years Location: back Quality: dull, aching,  and throbbing Current Pain Level: moderate Previous Pain Level: moderate Breakthrough pain: no Benefit from narcotic medications:  not checking What Activities task can be accomplished with current medication? yes Interested in weaning off narcotics:no   Stool softners/OTC fiber: no  Previous pain specialty evaluation:  yes Non-narcotic analgesic meds: no Narcotic contract: no  Relevant past medical, surgical, family and social history reviewed and updated as indicated. Interim medical history since our last visit reviewed. Allergies and medications reviewed and updated.  Review of Systems  Constitutional:  Negative for activity change, appetite change, diaphoresis, fatigue and fever.  Respiratory:  Negative for cough, chest tightness, shortness of breath and wheezing.   Cardiovascular:  Negative for chest pain, palpitations and leg swelling.  Gastrointestinal: Negative.   Endocrine: Negative for cold intolerance, heat intolerance, polydipsia, polyphagia and polyuria.  Neurological: Negative.   Psychiatric/Behavioral: Negative.     Per HPI unless specifically indicated above     Objective:    BP 130/76   Pulse 73   Temp 97.9 F (36.6 C) (Oral)   Ht 6' 0.99" (1.854 m)   Wt 186 lb 4.8 oz (84.5 kg)   SpO2 98%   BMI 24.58 kg/m   Wt Readings from Last 3 Encounters:  04/04/22 186 lb 4.8 oz (84.5 kg)  03/23/22 185 lb 6 oz (84.1 kg)  02/17/22 184 lb 4 oz (83.6 kg)    Physical Exam Vitals and nursing note reviewed.  Constitutional:      General: He is awake. He is not in acute distress.    Appearance: He is well-developed and well-groomed. He is not ill-appearing.  HENT:     Head: Normocephalic and atraumatic.     Right Ear: Hearing normal. No drainage.     Left Ear: Hearing normal. No drainage.  Eyes:     General: Lids are normal.        Right eye: No discharge.        Left eye: No discharge.     Conjunctiva/sclera: Conjunctivae normal.     Pupils: Pupils are equal, round, and reactive to light.  Neck:     Thyroid: No thyromegaly.     Vascular: No carotid bruit.     Trachea: Trachea normal.  Cardiovascular:     Rate and Rhythm: Normal rate and regular rhythm.     Heart sounds: Normal heart sounds, S1 normal and S2 normal. No murmur heard.    No gallop.  Pulmonary:     Effort:  Pulmonary effort is normal. No accessory muscle usage or respiratory distress.     Breath sounds: Normal breath sounds.  Abdominal:     General: Bowel sounds are normal.     Palpations: Abdomen is soft.  Musculoskeletal:        General: Normal range of motion.     Cervical back: Normal range of motion and neck supple.     Right lower leg: No edema.     Left lower leg: No edema.  Lymphadenopathy:     Cervical: No cervical adenopathy.  Skin:    General: Skin is warm and dry.     Capillary Refill: Capillary refill takes less than 2 seconds.  Neurological:     Mental Status: He is alert and oriented to person, place, and time.     Deep Tendon Reflexes:     Reflex Scores:      Patellar reflexes are 1+ on the right side and 1+ on the left side.  Achilles reflexes are 1+ on the right side and 1+ on the left side.    Comments: Antalgic gait, using cane.  Psychiatric:        Attention and Perception: Attention normal.        Mood and Affect: Mood normal.        Speech: Speech normal.        Behavior: Behavior normal. Behavior is cooperative.        Thought Content: Thought content normal.    Results for orders placed or performed in visit on 01/02/22  Bayer DCA Hb A1c Waived  Result Value Ref Range   HB A1C (BAYER DCA - WAIVED) 6.5 (H) 4.8 - 5.6 %  Comprehensive metabolic panel  Result Value Ref Range   Glucose 215 (H) 70 - 99 mg/dL   BUN 17 8 - 27 mg/dL   Creatinine, Ser 1.22 0.76 - 1.27 mg/dL   eGFR 60 >59 mL/min/1.73   BUN/Creatinine Ratio 14 10 - 24   Sodium 138 134 - 144 mmol/L   Potassium 4.1 3.5 - 5.2 mmol/L   Chloride 101 96 - 106 mmol/L   CO2 25 20 - 29 mmol/L   Calcium 9.4 8.6 - 10.2 mg/dL   Total Protein 6.5 6.0 - 8.5 g/dL   Albumin 4.0 3.8 - 4.8 g/dL   Globulin, Total 2.5 1.5 - 4.5 g/dL   Albumin/Globulin Ratio 1.6 1.2 - 2.2   Bilirubin Total 0.4 0.0 - 1.2 mg/dL   Alkaline Phosphatase 66 44 - 121 IU/L   AST 16 0 - 40 IU/L   ALT 11 0 - 44 IU/L  Lipid Panel  w/o Chol/HDL Ratio  Result Value Ref Range   Cholesterol, Total 103 100 - 199 mg/dL   Triglycerides 75 0 - 149 mg/dL   HDL 43 >39 mg/dL   VLDL Cholesterol Cal 16 5 - 40 mg/dL   LDL Chol Calc (NIH) 44 0 - 99 mg/dL  Magnesium  Result Value Ref Range   Magnesium 1.6 1.6 - 2.3 mg/dL      Assessment & Plan:   Problem List Items Addressed This Visit       Cardiovascular and Mediastinum   AF (paroxysmal atrial fibrillation) (HCC)    Chronic, ongoing.  Followed by cardiology at this time.  Continue current medication regimen and collaboration, appreciate their input.  HR regular today with stable rate on exam.      Relevant Medications   ELIQUIS 5 MG TABS tablet   rosuvastatin (CRESTOR) 5 MG tablet   Other Relevant Orders   CBC with Differential/Platelet   Coronary artery disease    Ongoing, continue collaboration with cardiology and current medication regimen.  Recent notes reviewed.        Relevant Medications   ELIQUIS 5 MG TABS tablet   rosuvastatin (CRESTOR) 5 MG tablet   Other Relevant Orders   CBC with Differential/Platelet   Hypertension associated with diabetes (HCC)    Chronic, stable.  BP at goal in office today.  A1c 6.5% recent visit, recheck today.  Continue Metformin and current cardiac medications as ordered.  Will continue collaboration with cardiology, appreciate their input, recent notes reviewed.  Recommend he continue to check BP at home at least 3 mornings a week and document.  DASH diet.  LABS: CMP, CBC, urine ALB.      Relevant Medications   ELIQUIS 5 MG TABS tablet   metFORMIN (GLUCOPHAGE) 500 MG tablet   rosuvastatin (CRESTOR) 5 MG tablet  Other Relevant Orders   HgB A1c   Urine Microalbumin w/creat. ratio   Comprehensive metabolic panel   CBC with Differential/Platelet   Peripheral vascular disease (HCC)    Chronic, stable.  To bilateral lower extremity.  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  Continue ASA daily and monitor closely for any wounds.  Return to  vascular as needed.      Relevant Medications   ELIQUIS 5 MG TABS tablet   rosuvastatin (CRESTOR) 5 MG tablet   Other Relevant Orders   CBC with Differential/Platelet     Respiratory   COPD (chronic obstructive pulmonary disease) (HCC)    Chronic, stable.  Followed by pulmonary at this time, continue this collaboration.  He is not using inhalers.  Reports some improvement in SOB with change in cardiac medications.      Relevant Orders   CBC with Differential/Platelet     Endocrine   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HCC)    Chronic, ongoing with A1c 6.5% last visit, recheck today, and urine ALB 80 (February 2023), recheck today.  Significant decreased sensation bilateral feet, monitor closely for wounds and falls.  Continue current diabetes medication regimen and adjust as needed. He did not get benefit from Lyrica or Gabapentin, will continue collaboration with neurology and pain clinic as needed.  Continue to monitor BS daily and document for visits.      Relevant Medications   metFORMIN (GLUCOPHAGE) 500 MG tablet   rosuvastatin (CRESTOR) 5 MG tablet   Other Relevant Orders   HgB A1c   Urine Microalbumin w/creat. ratio   Comprehensive metabolic panel   CBC with Differential/Platelet   Diabetes mellitus with proteinuria (HCC) - Primary    Chronic, ongoing with A1c 6.5% recent visit, recheck today, and urine ALB 80 (February 2023) - recheck today.  Continues to have stable BS control.  Will continue current medication regimen and adjust as needed.  Recommend he check BS at least 3 times daily.  Continue Benazepril for kidney protection.      Relevant Medications   metFORMIN (GLUCOPHAGE) 500 MG tablet   rosuvastatin (CRESTOR) 5 MG tablet   Other Relevant Orders   HgB A1c   CBC with Differential/Platelet   Hyperlipidemia associated with type 2 diabetes mellitus (HCC)    Chronic, ongoing.  Continue current medication regimen, tolerating 5 MG Crestor well without ADR,  and adjust as needed.  Lipid panel today.      Relevant Medications   ELIQUIS 5 MG TABS tablet   metFORMIN (GLUCOPHAGE) 500 MG tablet   rosuvastatin (CRESTOR) 5 MG tablet   Other Relevant Orders   Comprehensive metabolic panel   Lipid Panel w/o Chol/HDL Ratio   CBC with Differential/Platelet     Genitourinary   Benign prostatic hyperplasia without lower urinary tract symptoms    Chronic, ongoing and followed by urology.  Continue this collaboration and current regimen as ordered by them.  He would prefer to see a [facility] provider and local for these issues, referral placed to Dr. Erlene Quan.      Relevant Orders   Ambulatory referral to Urology   CKD (chronic kidney disease) stage 3, GFR 30-59 ml/min (HCC)    Chronic, ongoing.  Continue to monitor closely and refer to nephrology if decline.  Continue Benazepril for kidney protection and proteinuria.  Renal dose medications as needed based on labs.        Relevant Orders   HgB A1c   Urine Microalbumin w/creat. ratio   Comprehensive metabolic panel  CBC with Differential/Platelet     Hematopoietic and Hemostatic   Acquired thrombophilia ([place])    Patient on Eliquis with A-fib, monitor CBC regularly.      Relevant Orders   CBC with Differential/Platelet     Other   Chronic pain syndrome    Chronic, ongoing.  Followed by neurosurgery and pain management as needed.  Continue this collaboration and current regimen. Lidocaine patches sent in.      Relevant Orders   CBC with Differential/Platelet   Elevated TSH    Waxes and wanes -- recheck today and initiate medication as needed.      Relevant Orders   CBC with Differential/Platelet   Pain in right shin    Ongoing for several years, has seen various specialists.  Consider [facility] dermatology in future.  At this time recommend Capsaicin cream nightly.      Postlaminectomy syndrome, lumbar region    Chronic pain related to this.  Continue collaboration with pain management as  needed.  Lidocaine patches sent for pain.      Shortness of breath on exertion    Stable at this time with change in cardiac medications, continue collaboration with cardiology and pulmonary.      Relevant Orders   CBC with Differential/Platelet   Spinal cord stimulator status    Continue collaboration with pain management.        Follow up plan: Return in about 3 months (around 07/03/2022) for T2DM, HTN/HLD, CKD, CHRONIC PAIN, GERD, BPH.

## 2022-04-04 NOTE — Assessment & Plan Note (Signed)
Chronic, ongoing.  Followed by cardiology at this time.  Continue current medication regimen and collaboration, appreciate their input.  HR regular today with stable rate on exam.

## 2022-04-04 NOTE — Assessment & Plan Note (Signed)
Chronic, ongoing.  Continue current medication regimen, tolerating 5 MG Crestor well without ADR, and adjust as needed.  Lipid panel today.

## 2022-04-04 NOTE — Assessment & Plan Note (Signed)
Waxes and wanes -- recheck today and initiate medication as needed.

## 2022-04-04 NOTE — Assessment & Plan Note (Signed)
Chronic, stable.  BP at goal in office today.  A1c 6.5% recent visit, recheck today.  Continue Metformin and current cardiac medications as ordered.  Will continue collaboration with cardiology, appreciate their input, recent notes reviewed.  Recommend he continue to check BP at home at least 3 mornings a week and document.  DASH diet.  LABS: CMP, CBC, urine ALB.

## 2022-04-05 LAB — COMPREHENSIVE METABOLIC PANEL
ALT: 21 IU/L (ref 0–44)
AST: 21 IU/L (ref 0–40)
Albumin/Globulin Ratio: 1.9 (ref 1.2–2.2)
Albumin: 4.2 g/dL (ref 3.8–4.8)
Alkaline Phosphatase: 63 IU/L (ref 44–121)
BUN/Creatinine Ratio: 15 (ref 10–24)
BUN: 21 mg/dL (ref 8–27)
Bilirubin Total: 0.4 mg/dL (ref 0.0–1.2)
CO2: 23 mmol/L (ref 20–29)
Calcium: 9.7 mg/dL (ref 8.6–10.2)
Chloride: 99 mmol/L (ref 96–106)
Creatinine, Ser: 1.41 mg/dL — ABNORMAL HIGH (ref 0.76–1.27)
Globulin, Total: 2.2 g/dL (ref 1.5–4.5)
Glucose: 222 mg/dL — ABNORMAL HIGH (ref 70–99)
Potassium: 4.1 mmol/L (ref 3.5–5.2)
Sodium: 138 mmol/L (ref 134–144)
Total Protein: 6.4 g/dL (ref 6.0–8.5)
eGFR: 51 mL/min/{1.73_m2} — ABNORMAL LOW (ref >59)

## 2022-04-05 LAB — CBC WITH DIFFERENTIAL/PLATELET
Basophils Absolute: 0 10*3/uL (ref 0.0–0.2)
Basos: 0 %
EOS (ABSOLUTE): 0 10*3/uL (ref 0.0–0.4)
Eos: 1 %
Hematocrit: 37.5 % (ref 37.5–51.0)
Hemoglobin: 12.2 g/dL — ABNORMAL LOW (ref 13.0–17.7)
Immature Grans (Abs): 0 10*3/uL (ref 0.0–0.1)
Immature Granulocytes: 0 %
Lymphocytes Absolute: 1.3 10*3/uL (ref 0.7–3.1)
Lymphs: 26 %
MCH: 30.7 pg (ref 26.6–33.0)
MCHC: 32.5 g/dL (ref 31.5–35.7)
MCV: 94 fL (ref 79–97)
Monocytes Absolute: 0.5 10*3/uL (ref 0.1–0.9)
Monocytes: 10 %
Neutrophils Absolute: 3.2 10*3/uL (ref 1.4–7.0)
Neutrophils: 63 %
Platelets: 163 10*3/uL (ref 150–450)
RBC: 3.98 x10E6/uL — ABNORMAL LOW (ref 4.14–5.80)
RDW: 13.3 % (ref 11.6–15.4)
WBC: 5.1 10*3/uL (ref 3.4–10.8)

## 2022-04-05 LAB — MICROALBUMIN / CREATININE URINE RATIO
Creatinine, Urine: 161.3 mg/dL
Microalb/Creat Ratio: 13 mg/g creat (ref 0–29)
Microalbumin, Urine: 21.6 ug/mL

## 2022-04-05 LAB — LIPID PANEL W/O CHOL/HDL RATIO
Cholesterol, Total: 95 mg/dL — ABNORMAL LOW (ref 100–199)
HDL: 43 mg/dL (ref >39)
LDL Chol Calc (NIH): 35 mg/dL (ref 0–99)
Triglycerides: 82 mg/dL (ref 0–149)
VLDL Cholesterol Cal: 17 mg/dL (ref 5–40)

## 2022-04-05 LAB — HEMOGLOBIN A1C
Est. average glucose Bld gHb Est-mCnc: 154 mg/dL
Hgb A1c MFr Bld: 7 % — ABNORMAL HIGH (ref 4.8–5.6)

## 2022-04-05 NOTE — Progress Notes (Signed)
Contacted via [place] afternoon Mic, your labs have returned: - A1c is 7%, a slight trend up from previous of 6.5% in November.  I prefer not to change your medications at this time, but recommend focus on diet and trying to get some regular activity.  We will recheck next visit. - Kidney function, creatinine and eGFR, continues to show mild kidney disease stage 3a.  Liver function, AST and ALT, is normal. - Cholesterol labs are great!!  Continue statin therapy. - CBC shows mild low hemoglobin we will keep eye on.  Any questions? Keep being amazing!!  Thank you for allowing me to participate in your care.  I appreciate you. Kindest regards, Starleen Trussell

## 2022-04-11 ENCOUNTER — Telehealth: Payer: Medicare Other

## 2022-04-11 ENCOUNTER — Ambulatory Visit (INDEPENDENT_AMBULATORY_CARE_PROVIDER_SITE_OTHER): Payer: Medicare Other

## 2022-04-11 DIAGNOSIS — E1159 Type 2 diabetes mellitus with other circulatory complications: Secondary | ICD-10-CM

## 2022-04-11 DIAGNOSIS — E1143 Type 2 diabetes mellitus with diabetic autonomic (poly)neuropathy: Secondary | ICD-10-CM

## 2022-04-11 DIAGNOSIS — N1831 Chronic kidney disease, stage 3a: Secondary | ICD-10-CM

## 2022-04-11 DIAGNOSIS — E1129 Type 2 diabetes mellitus with other diabetic kidney complication: Secondary | ICD-10-CM

## 2022-04-11 MED FILL — Iron Sucrose Inj 20 MG/ML (Fe Equiv): INTRAVENOUS | Qty: 10 | Status: AC

## 2022-04-11 NOTE — Chronic Care Management (AMB) (Signed)
Chronic Care Management   CCM RN Visit Note  04/11/2022 Name: Sean Reyes. MRN: [id number] DOB: Aug 07, 1940  Subjective: Sean Reyes. is a 82 y.o. year old male who is a primary care patient of [name], [name], NP. The patient was referred to the Chronic Care Management team for assistance with care management needs subsequent to provider initiation of CCM services and plan of care.    Today's Visit:  Engaged with patient by telephone for follow up visit.        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 The patient states that he has started to go to the gym 3 times a week with his wife to see if he can build up some muscle and strength in his legs. He easily gets tired when ambulating and sometime more short of breath. He is pacing his activity. Does not use inhalers for his COPD due to the side effects of the inhalers. The patient states that he is  hoping by going to the gym he will see a positive difference. He and his wife just started this so he has not seen an improvement yet. Education and support given ; Reviewed medications with patient and discussed importance of medication adherence. The patient is compliant with medications;        Reviewed prescribed diet with patient heart  healthy/ADA diet. Review of heart healthy/ADA diet  ; Counseled on importance of regular laboratory monitoring as prescribed. The patient has regular lab work. Reviewed with the patient that his A1C is up from 6.5 to 7.0. Education on monitoring for acute changes and calling the pcp for recommendations.;        Discussed plans with patient for ongoing care management follow up and provided patient with direct contact information for care management team. Is appreciative of the CCM team calling and checking on him and helping him with his health and well being;      Provided patient with written educational materials related to hypo and hyperglycemia and importance of correct treatment;       Reviewed scheduled/upcoming provider appointments including: 07-03-2022 at 1040 am;         Advised patient, providing education and rationale, to check cbg twice daily and when you have symptoms of low or high blood sugar and record        call provider for findings outside established parameters;       Review of patient status, including review of consultants reports, relevant laboratory and other test results, and medications completed;       Advised patient to discuss changes in her DM health and well being with provider;      Screening for signs and symptoms of depression  related to chronic disease state;        Assessed social determinant of health barriers;         Symptom Management: Take medications as prescribed   Attend all scheduled provider appointments Call provider office for new concerns or questions  call the Suicide and Crisis Lifeline: 988 call the [place] National Suicide Prevention Lifeline: 970-285-5184 or TTY: (941)656-7251 TTY (306)474-4324) to talk to a trained counselor call 1-800-273-TALK (toll free, 24 hour hotline) if experiencing a Mental Health or [place]  check feet daily for cuts, sores or redness trim toenails straight across manage portion size wash and dry  feet carefully every day wear comfortable, cotton socks wear comfortable, well-fitting shoes  Follow Up Plan: Telephone follow up appointment with care management team member scheduled for: 06-13-2022 at 230 pm       CCM Expected Outcome:  Monitor, Self-Manage and Reduce Symptoms of: CKD       Current Barriers:  Chronic Disease Management support and education needs related to effective management of CKD Lab Results  Component Value Date   CREATININE 1.41 (H) 04/04/2022   CREATININE 1.22 01/02/2022   CREATININE 1.28 (H) 10/07/2021    Lab Results  Component Value Date   CREATININE 1.41 (H) 04/04/2022   BUN 21 04/04/2022   NA 138 04/04/2022   K 4.1 04/04/2022   CL 99 04/04/2022   CO2 23 04/04/2022     Planned Interventions: Assessed the patient understanding of chronic kidney disease. The patient saw the pcp on 04-04-2022. The patient had labs. Reviewed labs with the patient and read the notes from the pcp to the patient. The patient is aware of recommendations. Denies any acute changes in his CKD or kidney function.    Evaluation of current treatment plan related to chronic kidney disease self management and patient's adherence to plan as established by provider      Provided education to patient re: stroke prevention, s/s of heart attack and stroke    Reviewed prescribed diet heart healthy/ADA diet  Reviewed medications with patient and discussed importance of compliance. The patient is compliant with medications    Advised patient, providing education and rationale, to monitor blood pressure daily and record, calling PCP for findings outside established parameters    Discussed complications of poorly controlled blood pressure such as heart disease, stroke, circulatory complications, vision complications, kidney impairment, sexual dysfunction    Reviewed scheduled/upcoming provider appointments including: 07-03-2022 at 3 am  Advised patient to discuss changes in his kidney  function, or questions and concerns related to CKD with provider    Discussed plans with patient for ongoing care management follow up and provided patient with direct contact information for care management team    Screening for signs and symptoms of depression related to chronic disease state      Discussed the impact of chronic kidney disease on daily life and mental health and acknowledged and normalized feelings of disempowerment, fear, and frustration    Assessed social determinant of health barriers    Provided education on kidney disease progression    Engage patient in early, proactive and ongoing discussion about goals of care and what matters most to them    Support coping and stress management by recognizing current strategies and assist in developing new strategies such as mindfulness, journaling, relaxation techniques, problem-solving. The patient is still dealing with ongoing chronic back pain and leg weakness. Sees provider on a regular basis. Denies falls. States he had to  call the company that works with his stimulator and get that adjusted over the weekend as he was having sharp pain in his back. The patient states he has not gotten the lidocaine patches yet. The insurance had to approve. He will call and check on this as he has not heard back from the insurance determination. Reflective listening and support given.      Symptom Management: Take medications as prescribed   Attend all scheduled provider appointments Call provider office for new concerns or questions  call the Suicide and Crisis Lifeline: 988 call the [place] National Suicide Prevention Lifeline: 351-868-8570 or TTY: (773)768-7679 TTY (626) 772-2619) to talk to a trained counselor call 1-800-273-TALK (toll free, 24 hour hotline) if experiencing a Mental Health or [place]   Follow Up Plan: Telephone follow up appointment with care management team member scheduled for: 06-13-2022 at 230 pm       CCM  Expected Outcome:  Monitor, Self-Manage, and Reduce Symptoms of Hypertension       Current Barriers:  Knowledge Deficits related to fluctuations in blood pressures and the need to have normalized blood pressures to prevent the risk of heart attack and stroke Chronic Disease Management support and education needs related to effective management of HTN BP Readings from Last 3 Encounters:  04/04/22 130/76  03/23/22 120/60  02/17/22 (!) 130/54     Planned Interventions: Evaluation of current treatment plan related to hypertension self management and patient's adherence to plan as established by provider. The patients blood pressures are more stable now. The current plan of care is effective. The patient saw the cardiologist recently and got a good report. The patient states he is happy with his provider and thankful for interventions last year to get him the care he needed.  Provided education to patient re: stroke prevention, s/s of heart attack and stroke; Reviewed prescribed diet heart healthy/ADA diet. The patient is compliant with dietary restrictions Reviewed medications with patient and discussed importance of compliance. The patient states compliance with medications.;  Discussed plans with patient for ongoing care management follow up and provided patient with direct contact information for care management team; Advised patient, providing education and rationale, to monitor blood pressure daily and record, calling PCP for findings outside established parameters. The patient states that his blood pressures have been better since the addition of the other medication.  His blood pressures have been in normal range. With systolic numbers [id number] and diastolic number [id number]. Reviewed scheduled/upcoming provider appointments including: saw pcp on 04-04-2022 and cardiologist recently. Has an appointment with hematology/oncology 04-12-2022, next pcp appointment on 07-03-2022 at 8 am Advised patient  to discuss changes in his HTN, or heart health  with provider; Provided education on prescribed diet Heart healthy/ADA diet ;  Discussed complications of poorly controlled blood pressure such as heart disease, stroke, circulatory complications, vision complications, kidney impairment, sexual dysfunction;  Screening for signs and symptoms of depression related to chronic disease state;  Assessed social determinant of health barriers;   Symptom Management: Take medications as prescribed   Attend all scheduled provider appointments Call provider office for new concerns or questions  call the Suicide and Crisis Lifeline: 988 call the [place] National Suicide Prevention Lifeline: 509-726-2510 or TTY: 253-799-9201 TTY 856 851 8912) to talk to a trained counselor call 1-800-273-TALK (toll free, 24 hour hotline) if experiencing a Mental Health or [place]  check blood pressure 3 times per week learn about high blood pressure keep a blood pressure log take blood  pressure log to all doctor appointments call doctor for signs and symptoms of high blood pressure develop an action plan for high blood pressure keep all doctor appointments take medications for blood pressure exactly as prescribed report new symptoms to your doctor  Follow Up Plan: Telephone follow up appointment with care management team member scheduled for: 06-13-2022 at 230 pm          Plan:Telephone follow up appointment with care management team member scheduled for:  06-13-2022 at 58 pm  [place], MSN, CCM RN Care Manager  Chronic Care Management Direct Number: [phone]

## 2022-04-11 NOTE — Patient Instructions (Signed)
Please call the care guide team at [phone] if you need to cancel or reschedule your appointment.   If you are experiencing a Mental Health or [place] or need someone to talk to, please call the Suicide and Crisis Lifeline: 988 call the [place] National Suicide Prevention Lifeline: 801 050 5934 or TTY: (213)587-4865 TTY 737-135-7951) to talk to a trained counselor call 1-800-273-TALK (toll free, 24 hour hotline)   Following is a copy of the CCM Program Consent:  CCM service includes personalized support from designated clinical staff supervised by the physician, including individualized plan of care and coordination with other care providers 24/7 contact phone numbers for assistance for urgent and routine care needs. Service will only be billed when office clinical staff spend 20 minutes or more in a month to coordinate care. Only one practitioner may furnish and bill the service in a calendar month. The patient may stop CCM services at amy time (effective at the end of the month) by phone call to the office staff. 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 The patient states his blood sugars are doing well but he is having a lot of  weakness in his legs along with pain due to neuropathy. The patient states that he has started to go to the gym 3 times a week with his wife to see if he can build up some muscle and strength in his legs. He easily gets tired when ambulating and sometime more short of breath. He is pacing his activity. Does not use inhalers for his COPD due to the side effects of the inhalers. The patient states that he is  hoping by going to the gym he will see a positive difference. He and his wife just started this so he has not seen an improvement yet. Education and support given ; Reviewed medications with patient and discussed importance of medication adherence. The patient is compliant with medications;        Reviewed prescribed diet with patient heart healthy/ADA diet. Review of heart healthy/ADA diet  ; Counseled on importance of regular laboratory monitoring as prescribed. The patient has regular lab work. Reviewed with the patient that his A1C is up from 6.5 to 7.0. Education on monitoring for acute changes and calling the pcp for recommendations.;        Discussed plans with patient for ongoing care management follow up and provided patient with direct contact information for care management team. Is appreciative of the CCM team calling and checking on him and helping him with his health and well being;      Provided patient with written educational materials related to hypo and hyperglycemia and importance of correct  treatment;       Reviewed scheduled/upcoming provider appointments including: 07-03-2022 at 1040 am;         Advised patient, providing education and rationale, to check cbg twice daily and when you have symptoms of low or high blood sugar and record        call provider for findings outside established parameters;       Review of patient status, including review of consultants reports, relevant laboratory and other test results, and medications completed;       Advised patient to discuss changes  in her DM health and well being with provider;      Screening for signs and symptoms of depression related to chronic disease state;        Assessed social determinant of health barriers;         Symptom Management: Take medications as prescribed   Attend all scheduled provider appointments Call provider office for new concerns or questions  call the Suicide and Crisis Lifeline: 988 call the [place] National Suicide Prevention Lifeline: 878-003-8971 or TTY: 407-593-0527 TTY (310)656-6127) to talk to a trained counselor call 1-800-273-TALK (toll free, 24 hour hotline) if experiencing a Mental Health or [place]  check feet daily for cuts, sores or redness trim toenails straight across manage portion size wash and dry feet carefully every day wear comfortable, cotton socks wear comfortable, well-fitting shoes  Follow Up Plan: Telephone follow up appointment with care management team member scheduled for: 06-13-2022 at 230 pm       CCM Expected Outcome:  Monitor, Self-Manage and Reduce Symptoms of: CKD       Current Barriers:  Chronic Disease Management support and education needs related to effective management of CKD Lab Results  Component Value Date   CREATININE 1.41 (H) 04/04/2022   CREATININE 1.22 01/02/2022   CREATININE 1.28 (H) 10/07/2021    Lab Results  Component Value Date   CREATININE 1.41 (H) 04/04/2022   BUN 21 04/04/2022   NA 138 04/04/2022   K 4.1 04/04/2022   CL 99 04/04/2022   CO2 23 04/04/2022     Planned Interventions: Assessed the patient understanding of chronic kidney disease. The patient saw the pcp on 04-04-2022. The patient had labs. Reviewed labs with the patient and read the notes from the pcp to the patient. The patient is aware of recommendations. Denies any acute changes in his CKD or kidney function.    Evaluation of current treatment plan related to chronic kidney disease self management and patient's adherence to plan as established  by provider      Provided education to patient re: stroke prevention, s/s of heart attack and stroke    Reviewed prescribed diet heart healthy/ADA diet  Reviewed medications with patient and discussed importance of compliance. The patient is compliant with medications    Advised patient, providing education and rationale, to monitor blood pressure daily and record, calling PCP for findings outside established parameters    Discussed complications of poorly controlled blood pressure such as heart disease, stroke, circulatory complications, vision complications, kidney impairment, sexual dysfunction    Reviewed scheduled/upcoming provider appointments including: 07-03-2022 at 40 am  Advised patient to discuss changes in his kidney function, or questions and concerns related to CKD with provider    Discussed plans with patient for ongoing care management follow up and provided patient with direct contact information for care management team    Screening for signs and symptoms of depression related to chronic disease  state      Discussed the impact of chronic kidney disease on daily life and mental health and acknowledged and normalized feelings of disempowerment, fear, and frustration    Assessed social determinant of health barriers    Provided education on kidney disease progression    Engage patient in early, proactive and ongoing discussion about goals of care and what matters most to them    Support coping and stress management by recognizing current strategies and assist in developing new strategies such as mindfulness, journaling, relaxation techniques, problem-solving. The patient is still dealing with ongoing chronic back pain and leg weakness. Sees provider on a regular basis. Denies falls. States he had to call the company that works with his stimulator and get that adjusted over the weekend as he was having sharp pain in his back. The patient states he has not gotten the lidocaine patches  yet. The insurance had to approve. He will call and check on this as he has not heard back from the insurance determination. Reflective listening and support given.      Symptom Management: Take medications as prescribed   Attend all scheduled provider appointments Call provider office for new concerns or questions  call the Suicide and Crisis Lifeline: 988 call the [place] National Suicide Prevention Lifeline: 419-831-3338 or TTY: 414-824-9177 TTY (289)162-1491) to talk to a trained counselor call 1-800-273-TALK (toll free, 24 hour hotline) if experiencing a Mental Health or [place]   Follow Up Plan: Telephone follow up appointment with care management team member scheduled for: 06-13-2022 at 230 pm       CCM Expected Outcome:  Monitor, Self-Manage, and Reduce Symptoms of Hypertension       Current Barriers:  Knowledge Deficits related to fluctuations in blood pressures and the need to have normalized blood pressures to prevent the risk of heart attack and stroke Chronic Disease Management support and education needs related to effective management of HTN BP Readings from Last 3 Encounters:  04/04/22 130/76  03/23/22 120/60  02/17/22 (!) 130/54     Planned Interventions: Evaluation of current treatment plan related to hypertension self management and patient's adherence to plan as established by provider. The patients blood pressures are more stable now. The current plan of care is effective. The patient saw the cardiologist recently and got a good report. The patient states he is happy with his provider and thankful for interventions last year to get him the care he needed.  Provided education to patient re: stroke prevention, s/s of heart attack and stroke; Reviewed prescribed diet heart healthy/ADA diet. The patient is compliant with dietary restrictions Reviewed medications with patient and discussed importance of compliance. The patient states compliance with  medications.;  Discussed plans with patient for ongoing care management follow up and provided patient with direct contact information for care management team; Advised patient, providing education and rationale, to monitor blood pressure daily and record, calling PCP for findings outside established parameters. The patient states that his blood pressures have been better since the addition of the other medication.  His blood pressures have been in normal range. With systolic numbers [id number] and diastolic number 0000000. Reviewed scheduled/upcoming provider appointments including: saw pcp on 04-04-2022 and cardiologist recently. Has an appointment with hematology/oncology 04-12-2022, next pcp appointment on 07-03-2022 at 95 am Advised patient to discuss changes in his HTN, or heart health  with provider; Provided education on prescribed diet Heart healthy/ADA diet ;  Discussed complications of poorly controlled blood pressure such  as heart disease, stroke, circulatory complications, vision complications, kidney impairment, sexual dysfunction;  Screening for signs and symptoms of depression related to chronic disease state;  Assessed social determinant of health barriers;   Symptom Management: Take medications as prescribed   Attend all scheduled provider appointments Call provider office for new concerns or questions  call the Suicide and Crisis Lifeline: 988 call the [place] National Suicide Prevention Lifeline: (628)694-7645 or TTY: 445-815-1041 TTY 947-857-3430) to talk to a trained counselor call 1-800-273-TALK (toll free, 24 hour hotline) if experiencing a Mental Health or [place]  check blood pressure 3 times per week learn about high blood pressure keep a blood pressure log take blood pressure log to all doctor appointments call doctor for signs and symptoms of high blood pressure develop an action plan for high blood pressure keep all doctor appointments take  medications for blood pressure exactly as prescribed report new symptoms to your doctor  Follow Up Plan: Telephone follow up appointment with care management team member scheduled for: 06-13-2022 at 230 pm          Patient verbalizes understanding of instructions and care plan provided today and agrees to view in [facility]. Active MyChart status and patient understanding of how to access instructions and care plan via MyChart confirmed with patient.     Telephone follow up appointment with care management team member scheduled for: 06-13-2022 at 230 pm

## 2022-04-12 ENCOUNTER — Inpatient Hospital Stay: Payer: Medicare Other

## 2022-04-12 ENCOUNTER — Encounter: Payer: Self-pay | Admitting: Internal Medicine

## 2022-04-12 ENCOUNTER — Inpatient Hospital Stay: Payer: Medicare Other | Attending: Internal Medicine | Admitting: Internal Medicine

## 2022-04-12 VITALS — BP 136/75 | HR 68 | Temp 96.6°F | Resp 16 | Wt 184.4 lb

## 2022-04-12 DIAGNOSIS — I2510 Atherosclerotic heart disease of native coronary artery without angina pectoris: Secondary | ICD-10-CM | POA: Diagnosis not present

## 2022-04-12 DIAGNOSIS — D649 Anemia, unspecified: Secondary | ICD-10-CM | POA: Insufficient documentation

## 2022-04-12 DIAGNOSIS — D696 Thrombocytopenia, unspecified: Secondary | ICD-10-CM | POA: Insufficient documentation

## 2022-04-12 DIAGNOSIS — N183 Chronic kidney disease, stage 3 unspecified: Secondary | ICD-10-CM | POA: Diagnosis not present

## 2022-04-12 DIAGNOSIS — Z7901 Long term (current) use of anticoagulants: Secondary | ICD-10-CM | POA: Insufficient documentation

## 2022-04-12 DIAGNOSIS — Z79899 Other long term (current) drug therapy: Secondary | ICD-10-CM | POA: Insufficient documentation

## 2022-04-12 LAB — CBC WITH DIFFERENTIAL/PLATELET
Abs Immature Granulocytes: 0.02 10*3/uL (ref 0.00–0.07)
Basophils Absolute: 0 10*3/uL (ref 0.0–0.1)
Basophils Relative: 0 %
Eosinophils Absolute: 0 10*3/uL (ref 0.0–0.5)
Eosinophils Relative: 0 %
HCT: 35.4 % — ABNORMAL LOW (ref 39.0–52.0)
Hemoglobin: 12 g/dL — ABNORMAL LOW (ref 13.0–17.0)
Immature Granulocytes: 0 %
Lymphocytes Relative: 28 %
Lymphs Abs: 1.6 10*3/uL (ref 0.7–4.0)
MCH: 30.9 pg (ref 26.0–34.0)
MCHC: 33.9 g/dL (ref 30.0–36.0)
MCV: 91.2 fL (ref 80.0–100.0)
Monocytes Absolute: 0.6 10*3/uL (ref 0.1–1.0)
Monocytes Relative: 11 %
Neutro Abs: 3.5 10*3/uL (ref 1.7–7.7)
Neutrophils Relative %: 61 %
Platelets: 145 10*3/uL — ABNORMAL LOW (ref 150–400)
RBC: 3.88 MIL/uL — ABNORMAL LOW (ref 4.22–5.81)
RDW: 13.4 % (ref 11.5–15.5)
WBC: 5.8 10*3/uL (ref 4.0–10.5)
nRBC: 0 % (ref 0.0–0.2)

## 2022-04-12 LAB — COMPREHENSIVE METABOLIC PANEL
ALT: 15 U/L (ref 0–44)
AST: 20 U/L (ref 15–41)
Albumin: 3.9 g/dL (ref 3.5–5.0)
Alkaline Phosphatase: 56 U/L (ref 38–126)
Anion gap: 9 (ref 5–15)
BUN: 24 mg/dL — ABNORMAL HIGH (ref 8–23)
CO2: 26 mmol/L (ref 22–32)
Calcium: 9.3 mg/dL (ref 8.9–10.3)
Chloride: 99 mmol/L (ref 98–111)
Creatinine, Ser: 1.37 mg/dL — ABNORMAL HIGH (ref 0.61–1.24)
GFR, Estimated: 52 mL/min — ABNORMAL LOW (ref >60)
Glucose, Bld: 191 mg/dL — ABNORMAL HIGH (ref 70–99)
Potassium: 4.2 mmol/L (ref 3.5–5.1)
Sodium: 134 mmol/L — ABNORMAL LOW (ref 135–145)
Total Bilirubin: 0.6 mg/dL (ref 0.3–1.2)
Total Protein: 7.2 g/dL (ref 6.5–8.1)

## 2022-04-12 LAB — IRON AND TIBC
Iron: 69 ug/dL (ref 45–182)
Saturation Ratios: 19 % (ref 17.9–39.5)
TIBC: 356 ug/dL (ref 250–450)
UIBC: 287 ug/dL

## 2022-04-12 LAB — FERRITIN: Ferritin: 61 ng/mL (ref 24–336)

## 2022-04-12 NOTE — Progress Notes (Signed)
Patient denies new problems/concerns today.    Difficulty swallowing solids and being followed by GI.

## 2022-04-12 NOTE — Progress Notes (Signed)
[facility] NOTE  Patient Care Team: [name], NP as PCP - General (Nurse Practitioner) [name], MD as PCP - Family Medicine (Family Medicine) Wellington Hampshire, MD as PCP - Cardiology (Cardiology) Ralene Bathe, MD (Dermatology) Kary Kos, MD as Consulting Physician (Neurosurgery) Hooten, Laurice Record, MD (Orthopedic Surgery) Marlaine Hind, MD as Consulting Physician (Physical Medicine and Rehabilitation) Christy Sartorius, MD as Referring Physician (Urology) Vanita Ingles, RN as Case Manager ([place]) Cammie Sickle, MD as Consulting Physician (Oncology)  CHIEF COMPLAINTS/PURPOSE OF CONSULTATION: ANEMIA   HEMATOLOGY HISTORY: ;  #Chronic anemia hemoglobin 10-11; October 22 -iron saturation 17% ferritin 90s./? CKD-S/p Venofer x4; Jan 2023- Hb 12.3;  EGD/Colonoscopy: Sep, 2022- Dr.Anna  # OCT 2022-non-STEMI- [s/p stenting]aspirin Brilinta; paroxysmal A. fib on Eliquis [Dr. Paraschoes/Dr.Kowalski--> Dr. Arida];CKD stage III; chronic back pain-s/p back surgeries s/p spinal cord-pain stimulator; diabetes on oral medication; 2022-C. difficile colitis-s/p oral vancomycin.   HISTORY OF PRESENTING ILLNESS: Ambulating cane/crutches; alone.  Sean Reyes. 82 y.o.  male iron deficient anemia/?  CKD stage III is here for follow-up.   Patient currently on oral iron/slow release.  Tolerating well.  No constipation.  Denies any blood in stools or black-colored stools.   Review of Systems  Constitutional:  Positive for malaise/fatigue. Negative for chills, diaphoresis, fever and weight loss.  HENT:  Negative for nosebleeds and sore throat.   Eyes:  Negative for double vision.  Respiratory:  Positive for shortness of breath. Negative for cough, hemoptysis, sputum production and wheezing.   Cardiovascular:  Negative for chest pain, palpitations, orthopnea and leg swelling.  Gastrointestinal:  Negative for abdominal pain, blood in stool,  constipation, diarrhea, heartburn, melena, nausea and vomiting.  Genitourinary:  Negative for dysuria, frequency and urgency.  Musculoskeletal:  Positive for back pain and joint pain.  Skin: Negative.  Negative for itching and rash.  Neurological:  Negative for dizziness, tingling, focal weakness, weakness and headaches.  Endo/Heme/Allergies:  Does not bruise/bleed easily.  Psychiatric/Behavioral:  Negative for depression. The patient is not nervous/anxious and does not have insomnia.     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   Hyperlipidemia    Hypertension    Myocardial infarction ([place]) 12/2020   Nocturia    S/P ablation of atrial fibrillation    Ablative therapy   Sleep apnea    CPAP    Spinal stenosis    Tachycardia, unspecified     SURGICAL HISTORY: Past Surgical History:  Procedure Laterality Date   ABLATION     ANTERIOR LAT LUMBAR FUSION N/A 06/27/2017   Procedure: Anterior Lateral Lumbar Interbody  Fusion - Lumbar Two-Lumbar Three - Lumbar Three-Lumbar Four, Posterior Lumbar Interbody Fusion Lumbar Four- Five;  Surgeon: Kary Kos, MD;  Location: [place];  Service: Neurosurgery;  Laterality: N/A;  Anterior Lateral Lumbar  Interbody  Fusion - Lumbar Two-Lumbar Three - Lumbar Three-Lumbar Four, Posterior Lumbar Interbody Fusion Lumbar Four- Five   BACK SURGERY     CARDIAC CATHETERIZATION     CARDIOVERSION N/A 08/29/2018   Procedure: CARDIOVERSION;  Surgeon: Corey Skains, MD;  Location: [place] [facility];   Service: Cardiovascular;  Laterality: N/A;   CARDIOVERSION N/A 09/24/2018   Procedure: CARDIOVERSION;  Surgeon: Corey Skains, MD;  Location: [facility];  Service: Cardiovascular;  Laterality: N/A;   COLONOSCOPY WITH PROPOFOL N/A 10/05/2015   Procedure: COLONOSCOPY WITH PROPOFOL;  Surgeon: Lollie Sails, MD;  Location: [facility] ENDOSCOPY;  Service: Endoscopy;  Laterality: N/A;   COLONOSCOPY WITH PROPOFOL N/A 11/01/2020   Procedure: COLONOSCOPY WITH PROPOFOL;  Surgeon: Jonathon Bellows, MD;  Location: [facility] ENDOSCOPY;  Service: Gastroenterology;  Laterality: N/A;   CORONARY STENT INTERVENTION N/A 12/22/2020   Procedure: CORONARY STENT INTERVENTION;  Surgeon: [name], MD;  Location: [place] CV LAB;  Service: Cardiovascular;  Laterality: N/A;   ESOPHAGOGASTRODUODENOSCOPY N/A 11/01/2020   Procedure: ESOPHAGOGASTRODUODENOSCOPY (EGD);  Surgeon: Jonathon Bellows, MD;  Location: [facility] ENDOSCOPY;  Service: Gastroenterology;  Laterality: N/A;   ESOPHAGOGASTRODUODENOSCOPY (EGD) WITH PROPOFOL N/A 04/01/2018   Procedure: ESOPHAGOGASTRODUODENOSCOPY (EGD) WITH PROPOFOL;  Surgeon: Lollie Sails, MD;  Location: [facility] ENDOSCOPY;  Service: Endoscopy;  Laterality: N/A;   HERNIA REPAIR     JOINT REPLACEMENT Bilateral    hips  RT+  LEFT X2    LEFT HEART CATH AND CORONARY ANGIOGRAPHY N/A 12/22/2020   Procedure: LEFT HEART CATH AND CORONARY ANGIOGRAPHY;  Surgeon: [name], MD;  Location: [place] CV LAB;  Service: Cardiovascular;  Laterality: N/A;   LEFT HEART CATH AND CORONARY ANGIOGRAPHY Left 08/23/2021   Procedure: LEFT HEART CATH AND CORONARY ANGIOGRAPHY;  Surgeon: Corey Skains, MD;  Location: [place] CV LAB;  Service: Cardiovascular;  Laterality: Left;   LUMBAR LAMINECTOMY/DECOMPRESSION MICRODISCECTOMY Left 09/13/2016   Procedure: Microdiscectomy - Lumbar two-three,  Lumbar three- - left;  Surgeon: Kary Kos, MD;  Location: [place];  Service: Neurosurgery;  Laterality: Left;    SPINAL CORD STIMULATOR INSERTION  07/08/2019   TONSILLECTOMY      SOCIAL HISTORY: Social History   Socioeconomic History   Marital status: Married    Spouse name: Malachy Mood    Number of children: 2   Years of education: Not on file   Highest education level: High school graduate  Occupational History   Occupation: retired   Tobacco Use   Smoking status: Never   Smokeless tobacco: Never  Vaping Use   Vaping Use: Never used  Substance and Sexual Activity   Alcohol use: No   Drug use: No   Sexual activity: Not on file  Other Topics Concern   Not on file  Social History Narrative   MarriedGets regular exercise.      Lives in [place] with wife/ daughter. Never smoked; no alcohol. 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    Social Determinants of Health   Financial Resource Strain: Low Risk  (01/20/2022)   Overall Financial Resource Strain (CARDIA)    Difficulty of Paying Living Expenses: Not hard at all  Food Insecurity: No Food Insecurity (01/20/2022)   Hunger Vital Sign    Worried About Running Out of Food in the Last Year: Never true    Ran Out of Food in the Last Year: Never true  Transportation Needs: No Transportation Needs (01/20/2022)   PRAPARE - Hydrologist (Medical):  No    Lack of Transportation (Non-Medical): No  Physical Activity: Insufficiently Active (01/20/2022)   Exercise Vital Sign    Days of Exercise per Week: 3 days    Minutes of Exercise per Session: 20 min  Stress: No Stress Concern Present (01/20/2022)   [place]    Feeling of Stress : Only a little  Social Connections: Moderately Integrated (01/20/2022)   Social Connection and Isolation Panel [NHANES]    Frequency of Communication with Friends and Family: More than three times a week    Frequency of Social Gatherings with Friends and Family: More than three times a  week    Attends Religious Services: More than 4 times per year    Active Member of [employer] or Organizations: No    Attends Archivist Meetings: Never    Marital Status: Married  Human resources officer Violence: Not At Risk (01/20/2022)   Humiliation, Afraid, Rape, and Kick questionnaire    Fear of Current or Ex-Partner: No    Emotionally Abused: No    Physically Abused: No    Sexually Abused: No    FAMILY HISTORY: Family History  Problem Relation Age of Onset   Brain cancer Mother    Kidney disease Neg Hx    Prostate cancer Neg Hx    Kidney cancer Neg Hx    Bladder Cancer Neg Hx     ALLERGIES:  is allergic to levaquin [levofloxacin in d5w], shellfish allergy, amiodarone, and adhesive [tape].  MEDICATIONS:  Current Outpatient Medications  Medication Sig Dispense Refill   amLODipine (NORVASC) 5 MG tablet Take 1 tablet (5 mg total) by mouth at bedtime. 90 tablet 3   benazepril (LOTENSIN) 20 MG tablet Take 1 tablet (20 mg total) by mouth daily. 90 tablet 3   cyanocobalamin 1000 MCG tablet Take by mouth.     ELIQUIS 5 MG TABS tablet Take 1 tablet (5 mg total) by mouth 2 (two) times daily. 180 tablet 4   ferrous sulfate 325 (65 FE) MG EC tablet Take 325 mg by mouth daily with breakfast.     finasteride (PROSCAR) 5 MG tablet Take 1 tablet (5 mg total) by mouth daily. 90 tablet 2   isosorbide mononitrate (IMDUR) 30 MG 24 hr tablet Take 30 mg by mouth daily.     lidocaine (LIDODERM) 5 % Place 1 patch onto the skin daily. Remove & Discard patch within 12 hours or as directed by MD 30 patch 2   metFORMIN (GLUCOPHAGE) 500 MG tablet Take 1 tablet (500 mg total) by mouth 2 (two) times daily with a meal. 180 tablet 4   metoprolol succinate (TOPROL XL) 25 MG 24 hr tablet Take 0.5 tablets (12.5 mg total) by mouth daily. 45 tablet 1   omeprazole (PRILOSEC) 40 MG capsule Take 1 capsule (40 mg total) by mouth in the morning and at bedtime. 180 capsule 3   rosuvastatin (CRESTOR) 5 MG tablet Take 1  tablet (5 mg total) by mouth daily. 90 tablet 4   solifenacin (VESICARE) 5 MG tablet Take 5 mg by mouth daily.     tamsulosin (FLOMAX) 0.4 MG CAPS capsule Take 0.8 mg by mouth daily.     No current facility-administered medications for this visit.      PHYSICAL EXAMINATION:   Vitals:   04/12/22 1300  BP: 136/75  Pulse: 68  Resp: 16  Temp: (!) 96.6 F (35.9 C)    Filed Weights   04/12/22 1300  Weight: 184 lb 6.4 oz (83.6 kg)     Physical Exam Vitals and nursing note reviewed.  HENT:     Head: Normocephalic and atraumatic.     Mouth/Throat:     Pharynx: Oropharynx is clear.  Eyes:     Extraocular Movements: Extraocular movements intact.     Pupils: Pupils are equal, round, and reactive to light.  Cardiovascular:     Rate and Rhythm: Normal rate and regular rhythm.  Pulmonary:     Comments: Decreased breath sounds bilaterally.  Abdominal:     Palpations: Abdomen is soft.  Musculoskeletal:        General: Normal range of motion.     Cervical back: Normal range of motion.  Skin:    General: Skin is warm.  Neurological:     General: No focal deficit present.     Mental Status: He is alert and oriented to person, place, and time.  Psychiatric:        Behavior: Behavior normal.        Judgment: Judgment normal.     LABORATORY DATA:  I have reviewed the data as listed Lab Results  Component Value Date   WBC 5.8 04/12/2022   HGB 12.0 (L) 04/12/2022   HCT 35.4 (L) 04/12/2022   MCV 91.2 04/12/2022   PLT 145 (L) 04/12/2022   Recent Labs    08/05/21 0732 09/05/21 1529 10/07/21 1408 01/02/22 1110 04/04/22 1118 04/12/22 1238  NA 138   < > 133* 138 138 134*  K 4.3   < > 4.2 4.1 4.1 4.2  CL 103   < > 98 101 99 99  CO2 28   < > '26 25 23 26  '$ GLUCOSE 138*   < > 171* 215* 222* 191*  BUN 21   < > '23 17 21 '$ 24*  CREATININE 1.30*   < > 1.28* 1.22 1.41* 1.37*  CALCIUM 9.1   < > 9.2 9.4 9.7 9.3  GFRNONAA 56*  --  57*  --   --  52*  PROT  --    < > 7.3 6.5 6.4  7.2  ALBUMIN  --    < > 3.6 4.0 4.2 3.9  AST  --    < > '19 16 21 20  '$ ALT  --    < > '16 11 21 15  '$ ALKPHOS  --    < > 53 66 63 56  BILITOT  --    < > 0.6 0.4 0.4 0.6   < > = values in this interval not displayed.     No results found.  Symptomatic anemia Mild to moderate symptomatic anemia Hb 11-12;/Likely CKD-III. S/p IV venofer weekly x4.; on slow release PO iron.   # AUG 2023- Iron sat- 21%; ferritin- 107- HOLD IV venofer. Hb 12- continue PO iron  # Mild thrombocytopenia platelets 145.  Monitor for now.   # CKD stage III [GFR 67s]- stable  # CAD [sp stenting OCT 2022; Dr.Arida]; on Eliquis/antiplatelet therapy.stable  *BNP-dr.Arida # DISPOSITION:  # HOLD venofer- # in 6 months- MD; labs- cbc/cmp; iron studies/feritin-;possible venofer- Dr.B  All questions were answered. The patient knows to call the clinic with any problems, questions or concerns.    Cammie Sickle, MD 04/12/2022 1:36 PM

## 2022-04-12 NOTE — Assessment & Plan Note (Addendum)
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 S/p IV venofer weekly x4.; on slow release PO iron.   # AUG 2023- Iron sat- 21%; ferritin- 107- HOLD IV venofer. Hb 12- continue PO iron  # Mild thrombocytopenia platelets 145.  Monitor for now.   # CKD stage III [GFR 9s]- stable  # CAD [sp stenting OCT 2022; Dr.Arida]; on Eliquis/antiplatelet therapy.stable  *BNP-dr.Arida # DISPOSITION:  # HOLD venofer- # in 6 months- MD; labs- cbc/cmp; iron studies/feritin-;possible venofer- Dr.B

## 2022-04-13 DIAGNOSIS — E1129 Type 2 diabetes mellitus with other diabetic kidney complication: Secondary | ICD-10-CM

## 2022-04-13 DIAGNOSIS — N1831 Chronic kidney disease, stage 3a: Secondary | ICD-10-CM

## 2022-04-13 DIAGNOSIS — E1159 Type 2 diabetes mellitus with other circulatory complications: Secondary | ICD-10-CM

## 2022-04-13 DIAGNOSIS — I152 Hypertension secondary to endocrine disorders: Secondary | ICD-10-CM

## 2022-04-13 DIAGNOSIS — E1143 Type 2 diabetes mellitus with diabetic autonomic (poly)neuropathy: Secondary | ICD-10-CM

## 2022-04-13 DIAGNOSIS — R809 Proteinuria, unspecified: Secondary | ICD-10-CM

## 2022-04-19 ENCOUNTER — Encounter: Payer: Self-pay | Admitting: Urology

## 2022-04-19 ENCOUNTER — Ambulatory Visit (INDEPENDENT_AMBULATORY_CARE_PROVIDER_SITE_OTHER): Payer: Medicare Other | Admitting: Urology

## 2022-04-19 ENCOUNTER — Other Ambulatory Visit
Admission: RE | Admit: 2022-04-19 | Discharge: 2022-04-19 | Disposition: A | Discharge status: Home / Self Care | Payer: Medicare Other | Attending: Urology | Admitting: Urology

## 2022-04-19 ENCOUNTER — Other Ambulatory Visit: Payer: Self-pay

## 2022-04-19 VITALS — BP 135/83 | HR 100 | Ht 73.0 in | Wt 184.0 lb

## 2022-04-19 DIAGNOSIS — N401 Enlarged prostate with lower urinary tract symptoms: Secondary | ICD-10-CM

## 2022-04-19 DIAGNOSIS — N3281 Overactive bladder: Secondary | ICD-10-CM | POA: Diagnosis not present

## 2022-04-19 DIAGNOSIS — N138 Other obstructive and reflux uropathy: Secondary | ICD-10-CM

## 2022-04-19 DIAGNOSIS — N400 Enlarged prostate without lower urinary tract symptoms: Secondary | ICD-10-CM | POA: Diagnosis not present

## 2022-04-19 LAB — URINALYSIS, COMPLETE (UACMP) WITH MICROSCOPIC
Bilirubin Urine: NEGATIVE
Glucose, UA: 500 mg/dL — AB
Ketones, ur: NEGATIVE mg/dL
Leukocytes,Ua: NEGATIVE
Nitrite: NEGATIVE
Protein, ur: NEGATIVE mg/dL
Specific Gravity, Urine: 1.02 (ref 1.005–1.030)
Squamous Epithelial / HPF: NONE SEEN /HPF (ref 0–5)
pH: 5.5 (ref 5.0–8.0)

## 2022-04-19 LAB — BLADDER SCAN AMB NON-IMAGING

## 2022-04-19 MED ORDER — SOLIFENACIN SUCCINATE 5 MG PO TABS: 5.0000 mg | ORAL_TABLET | Freq: Every day | ORAL | 3 refills | Status: DC

## 2022-04-19 MED ORDER — FINASTERIDE 5 MG PO TABS: 5.0000 mg | ORAL_TABLET | Freq: Every day | ORAL | 3 refills | Status: DC

## 2022-04-19 MED ORDER — TAMSULOSIN HCL 0.4 MG PO CAPS: 0.8000 mg | ORAL_CAPSULE | Freq: Every day | ORAL | 3 refills | Status: DC

## 2022-04-19 NOTE — Progress Notes (Signed)
04/19/22 10:44 AM   Sean Reyes. Jan 02, 1941 [id number]  CC: BPH/OAB/lower urinary tract symptoms  HPI: Comorbid 82 year old male with a long history of urinary symptoms, previously was followed by Dr. Bernardo Heater, Dr. Arther Dames, and most recently by [facility] urology.  Extensive chart review was performed.  Currently, he is doing very well on maximal medical therapy with Flomax, finasteride, and Vesicare.  Currently really denies any significant urinary symptoms, may have nocturia 1-2 times overnight.  He previously was having severe nocturia 8-10 times overnight.  He is compliant with CPAP for sleep apnea now which sounds like he has been a large contributor to improving the symptoms.  He also was seen by a urologist at [facility] and had Botox in the bladder, unsure if that was helpful.  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  He also had episode of retention after a spinal cord pain stimulator was placed and had to catheterize for a few months.  He was also trialed on beta 3 agonist previously without significant improvement per the prior notes.  He denies any dysuria, gross hematuria, or recent urinary infections.  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   Hyperlipidemia    Hypertension    Myocardial infarction ([place]) 12/2020   Nocturia    S/P ablation of atrial fibrillation    Ablative therapy   Sleep apnea    CPAP    Spinal stenosis    Tachycardia, unspecified      Surgical History: Past Surgical History:  Procedure Laterality Date   ABLATION     ANTERIOR LAT LUMBAR FUSION N/A 06/27/2017   Procedure: Anterior Lateral Lumbar Interbody  Fusion - Lumbar Two-Lumbar Three - Lumbar Three-Lumbar Four, Posterior Lumbar Interbody Fusion Lumbar Four- Five;  Surgeon: Kary Kos, MD;  Location: [place];  Service: Neurosurgery;  Laterality: N/A;  Anterior Lateral Lumbar Interbody  Fusion - Lumbar Two-Lumbar Three - Lumbar Three-Lumbar Four, Posterior Lumbar Interbody Fusion Lumbar Four- Five   BACK SURGERY     CARDIAC CATHETERIZATION     CARDIOVERSION N/A 08/29/2018   Procedure: CARDIOVERSION;  Surgeon: Corey Skains, MD;  Location: [place] [facility];  Service: Cardiovascular;  Laterality: N/A;   CARDIOVERSION N/A 09/24/2018   Procedure: CARDIOVERSION;  Surgeon: Corey Skains, MD;  Location: [facility] ORS;  Service: Cardiovascular;  Laterality: N/A;   COLONOSCOPY WITH PROPOFOL N/A 10/05/2015   Procedure: COLONOSCOPY WITH PROPOFOL;  Surgeon: Lollie Sails, MD;  Location: [facility] ENDOSCOPY;  Service: Endoscopy;  Laterality: N/A;   COLONOSCOPY WITH PROPOFOL N/A 11/01/2020   Procedure: COLONOSCOPY WITH PROPOFOL;  Surgeon: Jonathon Bellows, MD;  Location: [facility] ENDOSCOPY;  Service: Gastroenterology;  Laterality: N/A;   CORONARY STENT INTERVENTION N/A 12/22/2020   Procedure: CORONARY STENT INTERVENTION;  Surgeon: [name], MD;  Location: [place] CV LAB;  Service: Cardiovascular;  Laterality: N/A;   ESOPHAGOGASTRODUODENOSCOPY N/A 11/01/2020   Procedure: ESOPHAGOGASTRODUODENOSCOPY (EGD);  Surgeon: Jonathon Bellows, MD;  Location: [facility] ENDOSCOPY;  Service: Gastroenterology;  Laterality: N/A;   ESOPHAGOGASTRODUODENOSCOPY (EGD) WITH PROPOFOL N/A 04/01/2018   Procedure: ESOPHAGOGASTRODUODENOSCOPY (EGD) WITH PROPOFOL;  Surgeon: Lollie Sails, MD;  Location: [facility] ENDOSCOPY;  Service: Endoscopy;  Laterality: N/A;   HERNIA REPAIR     JOINT REPLACEMENT Bilateral    hips   RT+  LEFT X2    LEFT HEART CATH AND CORONARY ANGIOGRAPHY N/A 12/22/2020   Procedure: LEFT HEART CATH AND CORONARY ANGIOGRAPHY;  Surgeon: [name], MD;  Location: [place] CV LAB;  Service: Cardiovascular;  Laterality: N/A;   LEFT HEART CATH AND CORONARY ANGIOGRAPHY Left 08/23/2021   Procedure: LEFT HEART CATH AND CORONARY ANGIOGRAPHY;  Surgeon: Corey Skains, MD;  Location: [place] CV LAB;  Service: Cardiovascular;  Laterality: Left;   LUMBAR LAMINECTOMY/DECOMPRESSION MICRODISCECTOMY Left 09/13/2016   Procedure: Microdiscectomy - Lumbar two-three,  Lumbar three- - left;  Surgeon: Kary Kos, MD;  Location: [place];  Service: Neurosurgery;  Laterality: Left;   SPINAL CORD STIMULATOR INSERTION  07/08/2019   TONSILLECTOMY       Family History: Family History  Problem Relation Age of Onset   Brain cancer Mother    Kidney disease Neg Hx    Prostate cancer Neg Hx    Kidney cancer Neg Hx    Bladder Cancer Neg Hx     Social History:  reports that he has never smoked. He has never used smokeless tobacco. He reports that he does not drink alcohol and does not use drugs.  Physical Exam: BP 135/83 (BP Location: Left Arm, Patient Position: Sitting, Cuff Size: Normal)   Pulse 100   Ht '6\' 1"'$  (1.854 m)   Wt 184 lb (83.5 kg)   BMI 24.28 kg/m    Constitutional:  Alert and oriented, No acute distress. Cardiovascular: No clubbing, cyanosis, or edema. Respiratory: Normal respiratory effort, no increased work of breathing. GI: Abdomen is soft, nontender, nondistended, no abdominal masses   Laboratory Data: Reviewed  Assessment & Plan:   Comorbid 82 year old male with long history of urinary symptoms with both obstructive and overactive symptoms.  Previously underwent urodynamics at [facility] that did show some detrusor overactivity.  Symptoms are currently very well-managed on maximal medical therapy with Flomax, finasteride, and Vesicare.  We discussed the risk of  anticholinergics with his age, but he has failed beta 3 agonist before and would like to continue the [place].  We reviewed the correlation between sleep apnea and nocturia, and I strongly recommended he continue using his CPAP machine.  Extensive chart review performed of prior [facility] urology notes, as well as prior, notes from 2019.  Using shared decision making he will opted to continue his current medications, return precautions discussed including dysuria, gross hematuria, or worsening urinary symptoms  Flomax, finasteride, Vesicare refilled, RTC 1 year  I spent 65 total minutes on the day of the encounter including pre-visit review of the medical record, face-to-face time with the patient, and post visit ordering of labs/imaging/tests.  Nickolas Madrid, MD 04/19/2022  [facility] [address] [place], [place] [address] [phone]

## 2022-05-12 DIAGNOSIS — H04413 Chronic dacryocystitis of bilateral lacrimal passages: Secondary | ICD-10-CM | POA: Diagnosis not present

## 2022-05-22 ENCOUNTER — Ambulatory Visit: Payer: Medicare Other | Admitting: Gastroenterology

## 2022-05-22 ENCOUNTER — Other Ambulatory Visit: Payer: Self-pay

## 2022-05-22 ENCOUNTER — Telehealth: Payer: Self-pay

## 2022-05-22 DIAGNOSIS — R1310 Dysphagia, unspecified: Secondary | ICD-10-CM

## 2022-05-22 DIAGNOSIS — R633 Feeding difficulties, unspecified: Secondary | ICD-10-CM

## 2022-05-22 NOTE — Telephone Encounter (Signed)
Dr. Tobi Bastos reached out to me to call the patient and ask if he wanted to cancel his appointment for today and schedule him an EGD with dilation since his barium swallow showed a small stricture. I then called patient to let him know what Dr. Tobi Bastos recommended and he agreed. Therefore, he agreed on doing it on 06/01/2022.

## 2022-05-22 NOTE — Progress Notes (Deleted)
Wyline Mood MD, MRCP(U.K) [address]  [address]  [place], [place] [address]  Main: [phone]  Fax: [phone]   Primary Care Physician: Marjie Skiff, NP  Primary Gastroenterologist:  Dr. Wyline Mood   No chief complaint on file.   HPI: Avishai Kapler. is a 82 y.o. 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 difficile diarrhea.  Iron deficiency anemia, GERD with esophagitis, hiatal hernia.   .He has previously been a patient of [facility] last seen at their office in July 2021 for diarrhea. Positive for Salmonella treated with antibiotics, esophagitis, GERD, dysphagia.  Last colonoscopy in 2017 showed diverticulosis multiple nonbleeding AVMs throughout the colon.  EGD in February 2020 by Dr. Marva Panda for dysphagia found to have a hiatal hernia, LA grade B esophagitis.   On metformin.  Stool occult test was positive on 09/14/2020, iron studies are normal with a ferritin of 66 and normal TIBC.  Hemoglobin 11.1 g with an MCV of 93.  His creatinine is 1.38.  He is on Eliquis. History of esophagitis.  2022 treated for C. difficile diarrhea   11/01/2020: EGD: 4 Schatzki's ring seen at the GE junction dilated to 18 mm biopsies esophagus were taken medium size hiatal hernia was noted.  Colonoscopy performed on the same day.  Nonbleeding internal hemorrhoids noted.  Otherwise normal.  Biopsies esophagus showed no abnormalities. 10/04/2020: C. difficile toxin positive.  Commenced on vancomycin 4 times daily. 09/29/2020: B12 and folate normal.   Interval history 02/16/2022-05/22/2022   02/27/2022:      He states that despite increasing his dose of omeprazole to twice a day he still has difficulty swallowing tablets and certain foods points to his neck where it gets stuck. Current Outpatient Medications  Medication Sig Dispense Refill   amLODipine (NORVASC) 5 MG tablet Take 1  tablet (5 mg total) by mouth at bedtime. 90 tablet 3   benazepril (LOTENSIN) 20 MG tablet Take 1 tablet (20 mg total) by mouth daily. 90 tablet 3   cyanocobalamin 1000 MCG tablet Take by mouth.     ELIQUIS 5 MG TABS tablet Take 1 tablet (5 mg total) by mouth 2 (two) times daily. 180 tablet 4   ferrous sulfate 325 (65 FE) MG EC tablet Take 325 mg by mouth daily with breakfast.     finasteride (PROSCAR) 5 MG tablet Take 1 tablet (5 mg total) by mouth daily. 90 tablet 3   isosorbide mononitrate (IMDUR) 30 MG 24 hr tablet Take 30 mg by mouth daily.     lidocaine (LIDODERM) 5 % Place 1 patch onto the skin daily. Remove & Discard patch within 12 hours or as directed by MD 30 patch 2   metFORMIN (GLUCOPHAGE) 500 MG tablet Take 1 tablet (500 mg total) by mouth 2 (two) times daily with a meal. 180 tablet 4   metoprolol succinate (TOPROL XL) 25 MG 24 hr tablet Take 0.5 tablets (12.5 mg total) by mouth daily. 45 tablet 1   omeprazole (PRILOSEC) 40 MG capsule Take 1 capsule (40 mg total) by mouth in the morning and at bedtime. 180 capsule 3   rosuvastatin (CRESTOR) 5 MG tablet Take 1 tablet (5 mg total) by mouth daily. 90 tablet 4   solifenacin (VESICARE) 5 MG tablet Take 1 tablet (5 mg total) by mouth daily. 90 tablet 3   tamsulosin (FLOMAX) 0.4 MG CAPS capsule Take 2 capsules (  0.8 mg total) by mouth daily after supper. 90 capsule 3   No current facility-administered medications for this visit.    Allergies as of 05/22/2022 - Review Complete 04/19/2022  Allergen Reaction Noted   Levaquin [levofloxacin in d5w] Anaphylaxis and Shortness Of Breath 07/27/2014   Shellfish allergy Anaphylaxis 08/26/2014   Amiodarone Other (See Comments) 04/19/2015   Adhesive [tape] Other (See Comments) 04/19/2018    ROS:  General: Negative for anorexia, weight loss, fever, chills, fatigue, weakness. ENT: Negative for hoarseness, difficulty swallowing , nasal congestion. CV: Negative for chest pain, angina, palpitations,  dyspnea on exertion, peripheral edema.  Respiratory: Negative for dyspnea at rest, dyspnea on exertion, cough, sputum, wheezing.  GI: See history of present illness. GU:  Negative for dysuria, hematuria, urinary incontinence, urinary frequency, nocturnal urination.  Endo: Negative for unusual weight change.    Physical Examination:   There were no vitals taken for this visit.  General: Well-nourished, well-developed in no acute distress.  Eyes: No icterus. Conjunctivae pink. Mouth: Oropharyngeal mucosa moist and pink , no lesions erythema or exudate. Lungs: Clear to auscultation bilaterally. Non-labored. Heart: Regular rate and rhythm, no murmurs rubs or gallops.  Abdomen: Bowel sounds are normal, nontender, nondistended, no hepatosplenomegaly or masses, no abdominal bruits or hernia , no rebound or guarding.   Extremities: No lower extremity edema. No clubbing or deformities. Neuro: Alert and oriented x 3.  Grossly intact. Skin: Warm and dry, no jaundice.   Psych: Alert and cooperative, normal mood and affect.   Imaging Studies: No results found.  Assessment and Plan:   Brenham Rayner. is a 82 y.o. y/o malehere to see me here as a follow-up for dysphagia.  Previously had evaluation including a modified barium swallow, EGD, dilation of Schatzki's ring eosinophilic esophagitis ruled out.  The issue of swallowing tablets still persists despite increasing the dose of PPI.  I will plan to repeat his barium swallow with a tablet as well as modified barium swallow.  I explained to him at times we may not find a reason for the dysphagia and it could be functional if about tests do not show any abnormality we will not investigate any further  At next that depending on the results we will reduce the dose of his PPI   We noted at his office visit his heart rate was 39 bpm, normotensive asymptomatic denies any dizziness we will inform Marjie Skiff, NP and his cardiologist office.  I have  advised him to see if he can reduce his dose of metoprolol to 12.5 mg from 25 mg till he hears back from his primary care provider or cardiology office  Dr Wyline Mood  MD,MRCP [facility]) Follow up in ***  BP check ***

## 2022-05-25 ENCOUNTER — Encounter: Payer: Self-pay | Admitting: Emergency Medicine

## 2022-05-25 ENCOUNTER — Emergency Department: Payer: Medicare Other

## 2022-05-25 ENCOUNTER — Encounter: Payer: Self-pay | Admitting: Gastroenterology

## 2022-05-25 DIAGNOSIS — R079 Chest pain, unspecified: Secondary | ICD-10-CM | POA: Diagnosis not present

## 2022-05-25 DIAGNOSIS — N189 Chronic kidney disease, unspecified: Secondary | ICD-10-CM | POA: Diagnosis not present

## 2022-05-25 DIAGNOSIS — I129 Hypertensive chronic kidney disease with stage 1 through stage 4 chronic kidney disease, or unspecified chronic kidney disease: Secondary | ICD-10-CM | POA: Insufficient documentation

## 2022-05-25 DIAGNOSIS — I10 Essential (primary) hypertension: Secondary | ICD-10-CM | POA: Diagnosis not present

## 2022-05-25 DIAGNOSIS — E1122 Type 2 diabetes mellitus with diabetic chronic kidney disease: Secondary | ICD-10-CM | POA: Insufficient documentation

## 2022-05-25 DIAGNOSIS — R0789 Other chest pain: Secondary | ICD-10-CM | POA: Diagnosis not present

## 2022-05-25 DIAGNOSIS — I2510 Atherosclerotic heart disease of native coronary artery without angina pectoris: Secondary | ICD-10-CM | POA: Diagnosis not present

## 2022-05-25 DIAGNOSIS — R1013 Epigastric pain: Secondary | ICD-10-CM | POA: Diagnosis not present

## 2022-05-25 DIAGNOSIS — Z7982 Long term (current) use of aspirin: Secondary | ICD-10-CM | POA: Insufficient documentation

## 2022-05-25 DIAGNOSIS — Z7901 Long term (current) use of anticoagulants: Secondary | ICD-10-CM | POA: Insufficient documentation

## 2022-05-25 LAB — BASIC METABOLIC PANEL
Anion gap: 9 (ref 5–15)
BUN: 26 mg/dL — ABNORMAL HIGH (ref 8–23)
CO2: 25 mmol/L (ref 22–32)
Calcium: 9.3 mg/dL (ref 8.9–10.3)
Chloride: 98 mmol/L (ref 98–111)
Creatinine, Ser: 1.18 mg/dL (ref 0.61–1.24)
GFR, Estimated: >60 mL/min (ref >60)
Glucose, Bld: 147 mg/dL — ABNORMAL HIGH (ref 70–99)
Potassium: 3.9 mmol/L (ref 3.5–5.1)
Sodium: 132 mmol/L — ABNORMAL LOW (ref 135–145)

## 2022-05-25 LAB — CBC
HCT: 37.2 % — ABNORMAL LOW (ref 39.0–52.0)
Hemoglobin: 12.6 g/dL — ABNORMAL LOW (ref 13.0–17.0)
MCH: 31.2 pg (ref 26.0–34.0)
MCHC: 33.9 g/dL (ref 30.0–36.0)
MCV: 92.1 fL (ref 80.0–100.0)
Platelets: 155 10*3/uL (ref 150–400)
RBC: 4.04 MIL/uL — ABNORMAL LOW (ref 4.22–5.81)
RDW: 13.4 % (ref 11.5–15.5)
WBC: 6.6 10*3/uL (ref 4.0–10.5)
nRBC: 0 % (ref 0.0–0.2)

## 2022-05-25 LAB — TROPONIN I (HIGH SENSITIVITY)
Troponin I (High Sensitivity): 6 ng/L (ref <18)
Troponin I (High Sensitivity): 8 ng/L (ref <18)

## 2022-05-25 NOTE — ED Triage Notes (Signed)
Pt presents via [facility] from home with complaints of epigastric pain following the consumption of a taco salad tonight. Pt states he has a hx of GERD but tonight the pain feels much worse. He notes improvement in his sx after taking 67 81mg  ASA prior to EMS arrival. A&Ox4 at this time. Denies N/V/D or SOB.

## 2022-05-25 NOTE — ED Triage Notes (Signed)
EMS brings pt in from home for c/o CP this evening, nonradiating

## 2022-05-26 ENCOUNTER — Emergency Department
Admission: EM | Admit: 2022-05-26 | Discharge: 2022-05-26 | Disposition: A | Discharge status: Home / Self Care | Payer: Medicare Other | Attending: Emergency Medicine | Admitting: Emergency Medicine

## 2022-05-26 ENCOUNTER — Emergency Department: Payer: Medicare Other

## 2022-05-26 DIAGNOSIS — R1013 Epigastric pain: Secondary | ICD-10-CM

## 2022-05-26 LAB — LIPASE, BLOOD: Lipase: 29 U/L (ref 11–51)

## 2022-05-26 NOTE — Discharge Instructions (Signed)
Continue to take your omeprazole reflux medication every day  If you develop recurrence of similar symptoms or any worsening then please return to the ED

## 2022-05-26 NOTE — ED Provider Notes (Signed)
[facility] Provider Note    Event Date/Time   First MD Initiated Contact with Patient 05/26/22 0041     (approximate)   History   Chest Pain   HPI  Sean Allie. is a 82 y.o. male who presents to the ED for evaluation of Chest Pain   I review a cardiology clinic visit from February.  History of paroxysmal A-fib, PVCs and CAD.  CKD, DM, HTN and HLD.  Anticoagulated on Eliquis, also on aspirin.  GERD on omeprazole  Patient presents to the ED alongside his daughter for evaluation of a resolved episode of severe epigastric pain that is postprandial.  This evening around 7 PM patient had taco salad prepared by her daughter and about 30 minutes after eating he developed severe epigastric pain.  Nonradiating, no associated symptoms.  Lasted 30 minutes to an hour and has not recurred.  Reports feeling better now.   Physical Exam   Triage Vital Signs: ED Triage Vitals  Enc Vitals Group     BP 05/25/22 2015 (!) 188/94     Pulse Rate 05/25/22 2015 78     Resp 05/25/22 2015 18     Temp 05/25/22 2015 97.9 F (36.6 C)     Temp Source 05/25/22 2015 Oral     SpO2 05/25/22 2008 98 %     Weight 05/25/22 2016 187 lb (84.8 kg)     Height 05/25/22 2016 6\' 1"  (1.854 m)     Head Circumference --      Peak Flow --      Pain Score --      Pain Loc --      Pain Edu? --      Excl. in GC? --     Most recent vital signs: Vitals:   05/25/22 2008 05/25/22 2015  BP:  (!) 188/94  Pulse:  78  Resp:  18  Temp:  97.9 F (36.6 C)  SpO2: 98% 100%    General: Awake, no distress.  CV:  Good peripheral perfusion.  Resp:  Normal effort.  Abd:  No distention.  Soft and benign throughout.  No clear tenderness. MSK:  No deformity noted.  Neuro:  No focal deficits appreciated. Other:     ED Results / Procedures / Treatments   Labs (all labs ordered are listed, but only abnormal results are displayed) Labs Reviewed  BASIC METABOLIC PANEL - Abnormal; Notable for the  following components:      Result Value   Sodium 132 (*)    Glucose, Bld 147 (*)    BUN 26 (*)    All other components within normal limits  CBC - Abnormal; Notable for the following components:   RBC 4.04 (*)    Hemoglobin 12.6 (*)    HCT 37.2 (*)    All other components within normal limits  TROPONIN I (HIGH SENSITIVITY)  TROPONIN I (HIGH SENSITIVITY)    EKG A-fib with a rate of 81 bpm.  Normal axis and intervals.  Nonspecific ST changes inferiorly without STEMI.  RADIOLOGY CXR interpreted by me without evidence of acute cardiopulmonary pathology.  Official radiology report(s): DG Chest 1 View  Result Date: 05/25/2022 CLINICAL DATA:  Chest pain EXAM: CHEST  1 VIEW COMPARISON:  08/05/2021 FINDINGS: The heart size and mediastinal contours are within normal limits. Aortic atherosclerosis. Both lungs are clear. Ascending thoracic stimulator leads. IMPRESSION: No active disease. Electronically Signed   By: Jasmine Pang M.D.   On: 05/25/2022 20:45  PROCEDURES and INTERVENTIONS:  .1-3 Lead EKG Interpretation  Performed by: Delton Prairie, MD Authorized by: Delton Prairie, MD     Interpretation: normal     ECG rate:  76   ECG rate assessment: normal     Rhythm: atrial fibrillation     Ectopy: none     Conduction: normal     Medications - No data to display   IMPRESSION / MDM / ASSESSMENT AND PLAN / ED COURSE  I reviewed the triage vital signs and the nursing notes.  Differential diagnosis includes, but is not limited to, ACS, GERD or gastritis, cholelithiasis, pancreatitis, retained esophageal foreign body  {Patient presents with symptoms of an acute illness or injury that is potentially life-threatening.  Pleasant 82 year old male with coronary history presents to the ED after an episode of postprandial epigastric discomfort, possibly of gastric etiology and suitable for outpatient management.  Looks well and remains asymptomatic throughout his ED stay of 7+ hours.  EKG is  nonischemic and he has 2 negative troponins.  Negative lipase and RUQ ultrasound.  Considering his history of GERD and symptomatology that he describes, fairly high suspicion for gastric etiology such as gastritis.  No signs of upper GI bleeding.  While I considered observation admission for this patient, he looks well and I believe a trial of outpatient management is reasonable enough.  We discussed close return precautions.  Clinical Course as of 05/26/22 0326  Fri May 26, 2022  0310 Reassessed.  Still feels well.  No recurrence of symptoms.  We discussed reassuring workup and plan of care.  He is comfortable going home.  Discussed close return precautions [DS]    Clinical Course User Index [DS] Delton Prairie, MD     FINAL CLINICAL IMPRESSION(S) / ED DIAGNOSES   Final diagnoses:  None     Rx / DC Orders   ED Discharge Orders     None        Note:  This document was prepared using Dragon voice recognition software and may include unintentional dictation errors.   Delton Prairie, MD 05/26/22 [phone]

## 2022-05-30 ENCOUNTER — Ambulatory Visit (INDEPENDENT_AMBULATORY_CARE_PROVIDER_SITE_OTHER): Payer: Medicare Other

## 2022-05-30 DIAGNOSIS — E1143 Type 2 diabetes mellitus with diabetic autonomic (poly)neuropathy: Secondary | ICD-10-CM

## 2022-05-30 DIAGNOSIS — E1159 Type 2 diabetes mellitus with other circulatory complications: Secondary | ICD-10-CM

## 2022-05-30 NOTE — Transitions of Care (Post Inpatient/ED Visit) (Cosign Needed)
   05/30/2022  Name: Sean Reyes. MRN: [id number] DOB: 1940/10/05  Today's TOC FU Call Status: Today's TOC FU Call Status:: Successful TOC FU Call Competed TOC FU Call Complete Date: 05/30/22  Transition Care Management Follow-up Telephone Call Date of Discharge: 05/26/22 Discharge Facility: [facility]) Type of Discharge: Emergency Department Reason for ED Visit: Other: (thought it was cardiac but was likely bad episode of GERD) How have you been since you were released from the hospital?: Better Any questions or concerns?: No  Items Reviewed: Did you receive and understand the discharge instructions provided?: Yes Medications obtained and verified?: Yes (Medications Reviewed) Any new allergies since your discharge?: No Dietary orders reviewed?: Yes Type of Diet Ordered:: Heart Healthy/ADA diet Do you have support at home?: Yes People in Home: child(ren), adult, spouse Name of Support/Comfort Primary Source: Keveon Niven- wife, and daughter [facility] and Equipment/Supplies: Were Home Health Services Ordered?: NA Any new equipment or medical supplies ordered?: NA  Functional Questionnaire: Do you need assistance with bathing/showering or dressing?: No Do you need assistance with meal preparation?: No Do you need assistance with eating?: Yes (is having his esophagus stretched on 06-01-2022) Do you have difficulty maintaining continence: No Do you need assistance with getting out of bed/getting out of a chair/moving?: No Do you have difficulty managing or taking your medications?: No  Follow up appointments reviewed: PCP Follow-up appointment confirmed?: No (the patient is having a procedure on 06-01-2022) MD Provider Line [phone] Given: Yes Specialist Hospital Follow-up appointment confirmed?: Yes Date of Specialist follow-up appointment?: 06/01/22 Follow-Up Specialty Provider:: the patient will have his esophagus stretched on  06-01-2022 Do you need transportation to your follow-up appointment?: No Do you understand care options if your condition(s) worsen?: Yes-patient verbalized understanding    Alto Denver RN, MSN, CCM RN Care Manager  Chronic Care Management Direct Number: [phone]

## 2022-05-30 NOTE — Chronic Care Management (AMB) (Signed)
Chronic Care Management   CCM RN Visit Note  05/30/2022 Name: [name]. MRN: [id number] DOB: 15-Apr-1940  Subjective: Sean Reyes. is a 82 y.o. year old male who is a primary care patient of [name], Dorie Rank, NP. The patient was referred to the Chronic Care Management team for assistance with care management needs subsequent to provider initiation of CCM services and plan of care.    Today's Visit:  Engaged with patient by telephone for follow up visit for [facility] after visit to the ED on 05-26-2022.        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 The patient states that he has started to go to the gym 3 times a week with his wife to see if he can build up some muscle and strength in his legs. He easily gets tired when ambulating and sometime more short of breath. He is pacing his activity. Does not use inhalers for his COPD due to the side effects of the inhalers. The patient states that he is  hoping by going to the gym he will see a positive difference. He and his wife just started this so he has not seen an improvement yet. Education and support given ; Reviewed medications with patient and discussed importance of medication adherence. The patient is compliant with medications;         Reviewed prescribed diet with patient heart healthy/ADA diet. Review of heart healthy/ADA diet. Hoping the procedure he has on 06-01-2022 will help with his swallowing. The patient is going to have his esophagus stretched. He has been having difficulty swallowing.  ; Counseled on importance of regular laboratory monitoring as prescribed. The patient has regular lab work. Reviewed with the patient that his A1C is up from 6.5 to 7.0. Education on monitoring for acute changes and calling the pcp for recommendations.;        Discussed plans with patient for ongoing care management follow up and provided patient with direct contact information for care management team. Is appreciative of the CCM team calling and checking on him and helping him with his health and well being;      Provided patient with written educational materials related to hypo and hyperglycemia and importance of correct treatment;       Reviewed scheduled/upcoming provider appointments including: 07-03-2022 at 1040 am;         Advised patient, providing education and rationale, to check cbg twice daily and when you have symptoms of low or high blood sugar and record        call provider for findings outside established parameters;       Review of patient status, including review of consultants reports, relevant laboratory and other test  results, and medications completed;       Advised patient to discuss changes in her DM health and well being with provider;      Screening for signs and symptoms of depression related to chronic disease state;        Assessed social determinant of health barriers;         Symptom Management: Take medications as prescribed   Attend all scheduled provider appointments Call provider office for new concerns or questions  call the Suicide and Crisis Lifeline: 988 call the [place] National Suicide Prevention Lifeline: 845-702-0945 or TTY: 825-178-3997 TTY 267-536-1933) to talk to a trained counselor call  1-800-273-TALK (toll free, 24 hour hotline) if experiencing a Mental Health or Behavioral Health Crisis  check feet daily for cuts, sores or redness trim toenails straight across manage portion size wash and dry feet carefully every day wear comfortable, cotton socks wear comfortable, well-fitting shoes  Follow Up Plan: Telephone follow up appointment with care management team member scheduled for: 06-13-2022 at 230 pm       CCM Expected Outcome:  Monitor, Self-Manage, and Reduce Symptoms of Hypertension       Current Barriers:  Knowledge Deficits related to fluctuations in blood pressures and the need to have normalized blood pressures to prevent the risk of heart attack and stroke Chronic Disease Management support and education needs related to effective management of HTN BP Readings from Last 3 Encounters:  05/26/22 (!) 161/72  04/19/22 135/83  04/12/22 136/75     Planned Interventions: Evaluation of current treatment plan related to hypertension self management and patient's adherence to plan as established by provider. The patients blood pressures are more stable now. The current plan of care is effective. The patient saw the cardiologist recently and got a good report. The patient states he is happy with his provider and thankful for interventions last year to get him the care he needed. The patient was in the ED recently (05-26-2022) thought he was having a cardiac event after eating a taco salad. They did testing and he felt better and it was determined likely he had a bad GERD flare up. The patient denies any acute distress. Is going to have his esophagus stretched on 06-01-2022 Provided education to patient re: stroke prevention, s/s of heart attack and stroke; Reviewed prescribed diet heart healthy/ADA diet. The patient is compliant with dietary restrictions. The patient has decided to stay away from Taco salads. Is having his esophagus stretched on 06-01-2022. Reviewed medications  with patient and discussed importance of compliance. The patient states compliance with medications. No medication changes after recent ER visit.;  Discussed plans with patient for ongoing care management follow up and provided patient with direct contact information for care management team; Advised patient, providing education and rationale, to monitor blood pressure daily and record, calling PCP for findings outside established parameters. The patient states that his blood pressures have been better since the addition of the other medication.  His blood pressures have been in normal range. With systolic numbers 401-027'O and diastolic number 60-70. Blood pressures were elevated when  he went to the hospital after thinking he was having a cardiac event. Thankful that everything checked out okay.  Reviewed scheduled/upcoming provider appointments including: saw pcp on 04-04-2022 and cardiologist recently. Has an appointment with hematology/oncology 04-12-2022, next pcp appointment on 07-03-2022 at 1040 am Advised patient to discuss changes in his HTN, or heart health  with provider; Provided education on prescribed diet Heart healthy/ADA diet ;  Discussed complications of poorly controlled blood pressure such as heart disease, stroke, circulatory complications, vision complications, kidney impairment, sexual dysfunction;  Screening for signs and symptoms of depression related to chronic disease state;  Assessed social determinant of health barriers;   Symptom Management: Take medications as prescribed   Attend all scheduled provider appointments Call provider office for new concerns or questions  call the Suicide and Crisis Lifeline: 988 call the [place] National Suicide Prevention Lifeline: 581-340-1530 or TTY: 605-845-2352 TTY (236)192-9573) to talk to a trained counselor call 1-800-273-TALK (toll free, 24 hour hotline) if experiencing a Mental Health or Behavioral Health Crisis  check blood pressure  3 times per week learn about high blood pressure keep a blood pressure log take blood pressure log to all doctor appointments call doctor for signs and symptoms of high blood pressure develop an action plan for high blood pressure keep all doctor appointments take medications for blood pressure exactly as prescribed report new symptoms to your doctor  Follow Up Plan: Telephone follow up appointment with care management team member scheduled for: 06-13-2022 at 230 pm          Plan:Telephone follow up appointment with care management team member scheduled for:  06-13-2022 at 230 pm  Alto Denver RN, MSN, CCM RN Care Manager  Chronic Care Management Direct Number: [phone]

## 2022-05-30 NOTE — Patient Instructions (Signed)
Please call the care guide team at [phone] if you need to cancel or reschedule your appointment.   If you are experiencing a Mental Health or Behavioral Health Crisis or need someone to talk to, please call the Suicide and Crisis Lifeline: 988 call the [place] National Suicide Prevention Lifeline: 306-743-4517 or TTY: 6847037360 TTY [phone]) to talk to a trained counselor call 1-800-273-TALK (toll free, 24 hour hotline)   Following is a copy of the CCM Program Consent:  CCM service includes personalized support from designated clinical staff supervised by the physician, including individualized plan of care and coordination with other care providers 24/7 contact phone numbers for assistance for urgent and routine care needs. Service will only be billed when office clinical staff spend 20 minutes or more in a month to coordinate care. Only one practitioner may furnish and bill the service in a calendar month. The patient may stop CCM services at amy time (effective at the end of the month) by phone call to the office staff. 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 The patient states his blood sugars are doing well but he is having a lot of  weakness in his legs along with pain due to neuropathy. The patient states that he has started to go to the gym 3 times a week with his wife to see if he can build up some muscle and strength in his legs. He easily gets tired when ambulating and sometime more short of breath. He is pacing his activity. Does not use inhalers for his COPD due to the side effects of the inhalers. The patient states that he is  hoping by going to the gym he will see a positive difference. He and his wife just started this so he has not seen an improvement yet. Education and support given ; Reviewed medications with patient and discussed importance of medication adherence. The patient is compliant with medications;        Reviewed prescribed diet with patient heart healthy/ADA diet. Review of heart healthy/ADA diet. Hoping the procedure he has on 06-01-2022 will help with his swallowing. The patient is going to have his esophagus stretched. He has been having difficulty swallowing.  ; Counseled on importance of regular laboratory monitoring as prescribed. The patient has regular lab work. Reviewed with the patient that his A1C is up from 6.5 to 7.0. Education on monitoring for acute changes and calling the pcp for recommendations.;        Discussed plans with patient for ongoing care management follow up and provided patient with direct contact information for care management team. Is appreciative of the CCM team calling and checking on him and helping  him with his health and well being;      Provided patient with written educational materials related to hypo and hyperglycemia and importance of correct treatment;       Reviewed scheduled/upcoming provider appointments including: 07-03-2022 at 1040 am;         Advised patient, providing education and rationale, to check cbg twice daily and when you have symptoms of low or high blood sugar and record        call provider for findings outside established parameters;       Review of  patient status, including review of consultants reports, relevant laboratory and other test results, and medications completed;       Advised patient to discuss changes in her DM health and well being with provider;      Screening for signs and symptoms of depression related to chronic disease state;        Assessed social determinant of health barriers;         Symptom Management: Take medications as prescribed   Attend all scheduled provider appointments Call provider office for new concerns or questions  call the Suicide and Crisis Lifeline: 988 call the [place] National Suicide Prevention Lifeline: 208-080-2897 or TTY: 9477198829 TTY 567-469-7232) to talk to a trained counselor call 1-800-273-TALK (toll free, 24 hour hotline) if experiencing a Mental Health or Behavioral Health Crisis  check feet daily for cuts, sores or redness trim toenails straight across manage portion size wash and dry feet carefully every day wear comfortable, cotton socks wear comfortable, well-fitting shoes  Follow Up Plan: Telephone follow up appointment with care management team member scheduled for: 06-13-2022 at 230 pm       CCM Expected Outcome:  Monitor, Self-Manage, and Reduce Symptoms of Hypertension       Current Barriers:  Knowledge Deficits related to fluctuations in blood pressures and the need to have normalized blood pressures to prevent the risk of heart attack and stroke Chronic Disease Management support and education needs related to effective management of HTN BP Readings from Last 3 Encounters:  05/26/22 (!) 161/72  04/19/22 135/83  04/12/22 136/75     Planned Interventions: Evaluation of current treatment plan related to hypertension self management and patient's adherence to plan as established by provider. The patients blood pressures are more stable now. The current plan of care is effective. The patient saw the cardiologist recently and got a good report. The patient states he is  happy with his provider and thankful for interventions last year to get him the care he needed. The patient was in the ED recently (05-26-2022) thought he was having a cardiac event after eating a taco salad. They did testing and he felt better and it was determined likely he had a bad GERD flare up. The patient denies any acute distress. Is going to have his esophagus stretched on 06-01-2022 Provided education to patient re: stroke prevention, s/s of heart attack and stroke; Reviewed prescribed diet heart healthy/ADA diet. The patient is compliant with dietary restrictions. The patient has decided to stay away from Taco salads. Is having his esophagus stretched on 06-01-2022. Reviewed medications with patient and discussed importance of compliance. The patient states compliance with medications. No medication changes after recent ER visit.;  Discussed plans with patient for ongoing care management follow up and provided patient with direct contact information for care management team; Advised patient, providing education and rationale, to monitor blood pressure daily and record, calling PCP for findings outside established  parameters. The patient states that his blood pressures have been better since the addition of the other medication.  His blood pressures have been in normal range. With systolic numbers [id number] and diastolic number 60-70. Blood pressures were elevated when  he went to the hospital after thinking he was having a cardiac event. Thankful that everything checked out okay.  Reviewed scheduled/upcoming provider appointments including: saw pcp on 04-04-2022 and cardiologist recently. Has an appointment with hematology/oncology 04-12-2022, next pcp appointment on 07-03-2022 at 1040 am Advised patient to discuss changes in his HTN, or heart health  with provider; Provided education on prescribed diet Heart healthy/ADA diet ;  Discussed complications of poorly controlled blood pressure such as heart  disease, stroke, circulatory complications, vision complications, kidney impairment, sexual dysfunction;  Screening for signs and symptoms of depression related to chronic disease state;  Assessed social determinant of health barriers;   Symptom Management: Take medications as prescribed   Attend all scheduled provider appointments Call provider office for new concerns or questions  call the Suicide and Crisis Lifeline: 988 call the [place] National Suicide Prevention Lifeline: (636)876-4098 or TTY: 909 419 0793 TTY 252-482-2267) to talk to a trained counselor call 1-800-273-TALK (toll free, 24 hour hotline) if experiencing a Mental Health or Behavioral Health Crisis  check blood pressure 3 times per week learn about high blood pressure keep a blood pressure log take blood pressure log to all doctor appointments call doctor for signs and symptoms of high blood pressure develop an action plan for high blood pressure keep all doctor appointments take medications for blood pressure exactly as prescribed report new symptoms to your doctor  Follow Up Plan: Telephone follow up appointment with care management team member scheduled for: 06-13-2022 at 230 pm          Patient verbalizes understanding of instructions and care plan provided today and agrees to view in MyChart. Active MyChart status and patient understanding of how to access instructions and care plan via MyChart confirmed with patient.  Telephone follow up appointment with care management team member scheduled for: 06-13-2022 at 230 pm

## 2022-05-30 NOTE — Progress Notes (Signed)
Noted  

## 2022-05-31 ENCOUNTER — Encounter: Payer: Self-pay | Admitting: Gastroenterology

## 2022-06-01 ENCOUNTER — Ambulatory Visit
Admission: RE | Admit: 2022-06-01 | Discharge: 2022-06-01 | Disposition: A | Discharge status: Home / Self Care | Payer: Medicare Other | Attending: Gastroenterology | Admitting: Gastroenterology

## 2022-06-01 ENCOUNTER — Ambulatory Visit: Admission: EM | Admit: 2022-06-01 | Payer: Medicare Other | Source: Home / Self Care | Admitting: Gastroenterology

## 2022-06-01 ENCOUNTER — Other Ambulatory Visit: Payer: Self-pay

## 2022-06-01 ENCOUNTER — Ambulatory Visit: Payer: Medicare Other | Admitting: Anesthesiology

## 2022-06-01 ENCOUNTER — Encounter
Admission: RE | Disposition: A | Discharge status: Home / Self Care | Payer: Self-pay | Source: Home / Self Care | Attending: Gastroenterology

## 2022-06-01 ENCOUNTER — Encounter: Payer: Self-pay | Admitting: Gastroenterology

## 2022-06-01 ENCOUNTER — Encounter: Admission: EM | Payer: Self-pay | Source: Home / Self Care

## 2022-06-01 DIAGNOSIS — E785 Hyperlipidemia, unspecified: Secondary | ICD-10-CM | POA: Insufficient documentation

## 2022-06-01 DIAGNOSIS — R633 Feeding difficulties, unspecified: Secondary | ICD-10-CM

## 2022-06-01 DIAGNOSIS — Z7984 Long term (current) use of oral hypoglycemic drugs: Secondary | ICD-10-CM | POA: Insufficient documentation

## 2022-06-01 DIAGNOSIS — I4891 Unspecified atrial fibrillation: Secondary | ICD-10-CM | POA: Insufficient documentation

## 2022-06-01 DIAGNOSIS — I517 Cardiomegaly: Secondary | ICD-10-CM | POA: Insufficient documentation

## 2022-06-01 DIAGNOSIS — Z87442 Personal history of urinary calculi: Secondary | ICD-10-CM | POA: Insufficient documentation

## 2022-06-01 DIAGNOSIS — F419 Anxiety disorder, unspecified: Secondary | ICD-10-CM | POA: Diagnosis not present

## 2022-06-01 DIAGNOSIS — Z955 Presence of coronary angioplasty implant and graft: Secondary | ICD-10-CM | POA: Diagnosis not present

## 2022-06-01 DIAGNOSIS — J449 Chronic obstructive pulmonary disease, unspecified: Secondary | ICD-10-CM | POA: Insufficient documentation

## 2022-06-01 DIAGNOSIS — E1151 Type 2 diabetes mellitus with diabetic peripheral angiopathy without gangrene: Secondary | ICD-10-CM | POA: Diagnosis not present

## 2022-06-01 DIAGNOSIS — Z7901 Long term (current) use of anticoagulants: Secondary | ICD-10-CM | POA: Diagnosis not present

## 2022-06-01 DIAGNOSIS — R1310 Dysphagia, unspecified: Secondary | ICD-10-CM | POA: Insufficient documentation

## 2022-06-01 DIAGNOSIS — K449 Diaphragmatic hernia without obstruction or gangrene: Secondary | ICD-10-CM | POA: Insufficient documentation

## 2022-06-01 DIAGNOSIS — K222 Esophageal obstruction: Secondary | ICD-10-CM | POA: Diagnosis not present

## 2022-06-01 DIAGNOSIS — E1122 Type 2 diabetes mellitus with diabetic chronic kidney disease: Secondary | ICD-10-CM | POA: Diagnosis not present

## 2022-06-01 DIAGNOSIS — F32A Depression, unspecified: Secondary | ICD-10-CM | POA: Diagnosis not present

## 2022-06-01 DIAGNOSIS — M1990 Unspecified osteoarthritis, unspecified site: Secondary | ICD-10-CM | POA: Diagnosis not present

## 2022-06-01 DIAGNOSIS — I252 Old myocardial infarction: Secondary | ICD-10-CM | POA: Insufficient documentation

## 2022-06-01 DIAGNOSIS — I130 Hypertensive heart and chronic kidney disease with heart failure and stage 1 through stage 4 chronic kidney disease, or unspecified chronic kidney disease: Secondary | ICD-10-CM | POA: Diagnosis not present

## 2022-06-01 DIAGNOSIS — G4730 Sleep apnea, unspecified: Secondary | ICD-10-CM | POA: Diagnosis not present

## 2022-06-01 DIAGNOSIS — I2510 Atherosclerotic heart disease of native coronary artery without angina pectoris: Secondary | ICD-10-CM | POA: Insufficient documentation

## 2022-06-01 DIAGNOSIS — D649 Anemia, unspecified: Secondary | ICD-10-CM | POA: Insufficient documentation

## 2022-06-01 DIAGNOSIS — I10 Essential (primary) hypertension: Secondary | ICD-10-CM | POA: Diagnosis not present

## 2022-06-01 DIAGNOSIS — K219 Gastro-esophageal reflux disease without esophagitis: Secondary | ICD-10-CM | POA: Insufficient documentation

## 2022-06-01 DIAGNOSIS — I509 Heart failure, unspecified: Secondary | ICD-10-CM | POA: Diagnosis not present

## 2022-06-01 DIAGNOSIS — N183 Chronic kidney disease, stage 3 unspecified: Secondary | ICD-10-CM | POA: Diagnosis not present

## 2022-06-01 HISTORY — PX: ESOPHAGOGASTRODUODENOSCOPY (EGD) WITH PROPOFOL: SHX5813

## 2022-06-01 SURGERY — ESOPHAGOGASTRODUODENOSCOPY (EGD) WITH PROPOFOL
Anesthesia: General

## 2022-06-01 MED ORDER — PROPOFOL 500 MG/50ML IV EMUL
INTRAVENOUS | Status: DC | PRN
  Administered 2022-06-01: 125 ug/kg/min via INTRAVENOUS

## 2022-06-01 MED ORDER — PROPOFOL 10 MG/ML IV BOLUS
INTRAVENOUS | Status: DC | PRN
  Administered 2022-06-01: 80 mg via INTRAVENOUS

## 2022-06-01 MED ORDER — SODIUM CHLORIDE 0.9 % IV SOLN: INTRAVENOUS | Status: DC

## 2022-06-01 MED ORDER — LIDOCAINE HCL (CARDIAC) PF 100 MG/5ML IV SOSY
PREFILLED_SYRINGE | INTRAVENOUS | Status: DC | PRN
  Administered 2022-06-01: 60 mg via INTRAVENOUS

## 2022-06-01 MED ORDER — PROPOFOL 1000 MG/100ML IV EMUL
INTRAVENOUS | Status: AC
  Filled 2022-06-01: qty 100

## 2022-06-01 MED ORDER — GLYCOPYRROLATE 0.2 MG/ML IJ SOLN
INTRAMUSCULAR | Status: DC | PRN
  Administered 2022-06-01: .2 mg via INTRAVENOUS

## 2022-06-01 NOTE — Anesthesia Preprocedure Evaluation (Signed)
Anesthesia Evaluation  Patient identified by MRN, date of birth, ID band Patient awake    Reviewed: Allergy & Precautions, NPO status , Patient's Chart, lab work & pertinent test results  History of Anesthesia Complications (+) history of anesthetic complications  Airway Mallampati: III  TM Distance: >3 FB Neck ROM: full    Dental  (+) Chipped   Pulmonary sleep apnea and Continuous Positive Airway Pressure Ventilation , COPD   Pulmonary exam normal        Cardiovascular hypertension, + CAD, + Past MI and + Peripheral Vascular Disease  + dysrhythmias Atrial Fibrillation   ECHO (6/23) IMPRESSIONS     1. Left ventricular ejection fraction, by estimation, is 60 to 65%. The  left ventricle has normal function. The left ventricle has no regional  wall motion abnormalities. There is mild left ventricular hypertrophy.  Left ventricular diastolic parameters  are consistent with Grade I diastolic dysfunction (impaired relaxation).   2. Right ventricular systolic function is normal. The right ventricular  size is normal. Tricuspid regurgitation signal is inadequate for assessing  PA pressure.   3. The mitral valve was not well visualized. Trivial mitral valve  regurgitation.   4. The aortic valve was not well visualized. Aortic valve regurgitation  is not visualized. No aortic stenosis is present.   5. There is mild dilatation of the ascending aorta, measuring 39 mm.   6. The inferior vena cava is normal in size with greater than 50%  respiratory variability, suggesting right atrial pressure of 3 mmHg.     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 No date: Atrial fibrillation No date: Cataracts, bilateral No date: Complication of anesthesia     Comment:  pt reports low BP's after surgery at [facility] and               difficulty awakening No date: Depression No date: Diabetes     Comment:  dx 6-8 yrs ago No date: Dysrhythmia     Comment:  a-fib No date: GERD (gastroesophageal reflux disease)     Comment:  OCC TAKES ALKA SELTZER No date: History of kidney stones     Comment:  10-15 yrs ago No date: HOH (hard of hearing)     Comment:  bilateral hearing aids No date: Hyperlipidemia No date: Hypertension 12/2020: Myocardial infarction No date: Nocturia No date: S/P ablation of atrial fibrillation     Comment:  Ablative therapy No date: Sleep apnea     Comment:  CPAP  No date: Spinal stenosis No date: Tachycardia, unspecified  Past Surgical History: No date: ABLATION 06/27/2017: ANTERIOR LAT LUMBAR FUSION; N/A     Comment:  Procedure: Anterior Lateral Lumbar Interbody  Fusion -               Lumbar Two-Lumbar Three - Lumbar Three-Lumbar Four,               Posterior Lumbar Interbody Fusion Lumbar Four- Five;                Surgeon: Donalee Citrin, MD;  Location: [facility] OR;  Service:               Neurosurgery;  Laterality: N/A;  Anterior Lateral Lumbar               Interbody  Fusion - Lumbar Two-Lumbar Three - Lumbar               Three-Lumbar Four, Posterior Lumbar Interbody Fusion               Lumbar Four- Five No date: BACK SURGERY No date: CARDIAC CATHETERIZATION 08/29/2018: CARDIOVERSION; N/A     Comment:  Procedure: CARDIOVERSION;  Surgeon: Lamar Blinks,               MD;  Location: [facility] ORS;  Service: Cardiovascular;                Laterality: N/A; 09/24/2018: CARDIOVERSION; N/A     Comment:  Procedure: CARDIOVERSION;  Surgeon: Lamar Blinks,               MD;   Location: [facility] ORS;  Service: Cardiovascular;                Laterality: N/A; 10/05/2015: COLONOSCOPY WITH PROPOFOL; N/A     Comment:  Procedure: COLONOSCOPY WITH PROPOFOL;  Surgeon: Christena Deem, MD;  Location: [facility] ENDOSCOPY;  Service:               Endoscopy;  Laterality: N/A; 11/01/2020: COLONOSCOPY WITH PROPOFOL; N/A     Comment:  Procedure: COLONOSCOPY WITH PROPOFOL;  Surgeon: Wyline Mood, MD;  Location: [facility] ENDOSCOPY;  Service:               Gastroenterology;  Laterality: N/A; 12/22/2020: CORONARY STENT INTERVENTION; N/A     Comment:  Procedure: CORONARY STENT INTERVENTION;  Surgeon:               Marcina Millard, MD;  Location: [facility] INVASIVE CV               LAB;  Service: Cardiovascular;  Laterality: N/A; 11/01/2020: ESOPHAGOGASTRODUODENOSCOPY; N/A     Comment:  Procedure: ESOPHAGOGASTRODUODENOSCOPY (EGD);  Surgeon:               Wyline Mood, MD;  Location: [facility] ENDOSCOPY;  Service:               Gastroenterology;  Laterality: N/A; 04/01/2018: ESOPHAGOGASTRODUODENOSCOPY (EGD) WITH PROPOFOL; N/A     Comment:  Procedure: ESOPHAGOGASTRODUODENOSCOPY (EGD) WITH               PROPOFOL;  Surgeon: Christena Deem, MD;  Location:               [facility] ENDOSCOPY;  Service: Endoscopy;  Laterality: N/A; No date: EYE SURGERY No date: HERNIA REPAIR No date: JOINT REPLACEMENT; Bilateral     Comment:  hips  RT+  LEFT X2  12/22/2020: LEFT HEART CATH AND CORONARY ANGIOGRAPHY; N/A     Comment:  Procedure: LEFT HEART CATH AND CORONARY ANGIOGRAPHY;                Surgeon: Marcina Millard, MD;  Location: [facility]               INVASIVE CV LAB;  Service: Cardiovascular;  Laterality:  N/A; 08/23/2021: LEFT HEART CATH AND CORONARY ANGIOGRAPHY; Left     Comment:  Procedure: LEFT HEART CATH AND CORONARY ANGIOGRAPHY;                Surgeon: Lamar Blinks, MD;  Location: [facility] INVASIVE               CV LAB;  Service: Cardiovascular;  Laterality:  Left; 09/13/2016: LUMBAR LAMINECTOMY/DECOMPRESSION MICRODISCECTOMY; Left     Comment:  Procedure: Microdiscectomy - Lumbar two-three,  Lumbar               three- - left;  Surgeon: Donalee Citrin, MD;  Location: [facility]               OR;  Service: Neurosurgery;  Laterality: Left; 07/08/2019: SPINAL CORD STIMULATOR INSERTION No date: TONSILLECTOMY  BMI    Body Mass Index: 24.67 kg/m      Reproductive/Obstetrics negative OB ROS                              Anesthesia Physical Anesthesia Plan  ASA: 3  Anesthesia Plan: General   Post-op Pain Management: Minimal or no pain anticipated   Induction: Intravenous  PONV Risk Score and Plan: Propofol infusion and TIVA  Airway Management Planned: Natural Airway and Nasal Cannula  Additional Equipment:   Intra-op Plan:   Post-operative Plan:   Informed Consent: I have reviewed the patients History and Physical, chart, labs and discussed the procedure including the risks, benefits and alternatives for the proposed anesthesia with the patient or authorized representative who has indicated his/her understanding and acceptance.     Dental Advisory Given  Plan Discussed with: Anesthesiologist, CRNA and Surgeon  Anesthesia Plan Comments: (Patient consented for risks of anesthesia including but not limited to:  - adverse reactions to medications - risk of airway placement if required - damage to eyes, teeth, lips or other oral mucosa - nerve damage due to positioning  - sore throat or hoarseness - Damage to heart, brain, nerves, lungs, other parts of body or loss of life  Patient voiced understanding.)         Anesthesia Quick Evaluation

## 2022-06-01 NOTE — Anesthesia Postprocedure Evaluation (Signed)
Anesthesia Post Note  Patient: Sean Reyes.  Procedure(s) Performed: ESOPHAGOGASTRODUODENOSCOPY (EGD) WITH PROPOFOL  Patient location during evaluation: Endoscopy Anesthesia Type: General Level of consciousness: awake and alert Pain management: pain level controlled Vital Signs Assessment: post-procedure vital signs reviewed and stable Respiratory status: spontaneous breathing, nonlabored ventilation, respiratory function stable and patient connected to nasal cannula oxygen Cardiovascular status: blood pressure returned to baseline and stable Postop Assessment: no apparent nausea or vomiting Anesthetic complications: no   No notable events documented.   Last Vitals:  Vitals:   06/01/22 1057 06/01/22 1107  BP: (!) 152/87   Pulse: 75 72  Resp: 13   Temp:    SpO2: 100% 100%    Last Pain:  Vitals:   06/01/22 1107  TempSrc:   PainSc: 0-No pain                 Louie Boston

## 2022-06-01 NOTE — Op Note (Signed)
[facility]astroenterology Patient Name: Sean Reyes Procedure Date: 06/01/2022 10:20 AM MRN: [id number] Account #: [id number] Date of Birth: [birth year] Admit Type: Outpatient Age: 82 Room: [facility] ENDO ROOM 3 Gender: Male Note Status: Finalized Instrument Name: Laurette Schimke [id number] Procedure:             Upper GI endoscopy Indications:           Dysphagia Providers:             Wyline Mood MD, MD Referring MD:          Dorie Rank. Harvest Dark (Referring MD) Medicines:             Monitored Anesthesia Care Complications:         No immediate complications. Procedure:             Pre-Anesthesia Assessment:                        - Prior to the procedure, a History and Physical was                         performed, and patient medications, allergies and                         sensitivities were reviewed. The patient's tolerance                         of previous anesthesia was reviewed.                        - The risks and benefits of the procedure and the                         sedation options and risks were discussed with the                         patient. All questions were answered and informed                         consent was obtained.                        - ASA Grade Assessment: II - A patient with mild                         systemic disease.                        After obtaining informed consent, the endoscope was                         passed under direct vision. Throughout the procedure,                         the patient's blood pressure, pulse, and oxygen                         saturations were monitored continuously. The Endoscope                         was introduced through the  mouth, and advanced to the                         third part of duodenum. The upper GI endoscopy was                         accomplished without difficulty. The patient tolerated                         the procedure well. Findings:      The examined  duodenum was normal.      A small hiatal hernia was present.      A non-obstructing Schatzki ring was found at the gastroesophageal       junction. A TTS dilator was passed through the scope. Dilation with a       15-16.5-18 mm balloon and an 18-19-20 mm balloon dilator was performed       to 20 mm. The dilation site was examined and showed mild mucosal       disruption. This ring was not completely disrupted with dilation and a       portion of the ring was disrupted using biopsy forceps      The cardia and gastric fundus were normal on retroflexion. Impression:            - Normal examined duodenum.                        - Small hiatal hernia.                        - Non-obstructing Schatzki ring. Dilated. Biopsied.                        - No specimens collected. Recommendation:        - Discharge patient to home (with escort).                        - Resume previous diet.                        - Continue present medications.                        - Return to my office as previously scheduled. Procedure Code(s):     --- Professional ---                        [phone], Esophagogastroduodenoscopy, flexible,                         transoral; with transendoscopic balloon dilation of                         esophagus (less than 30 mm diameter) Diagnosis Code(s):     --- Professional ---                        K44.9, Diaphragmatic hernia without obstruction or                         gangrene  K22.2, Esophageal obstruction                        R13.10, Dysphagia, unspecified CPT copyright 2022 American [employer]. All rights reserved. The codes documented in this report are preliminary and upon coder review may  be revised to meet current compliance requirements. Wyline Mood, MD Wyline Mood MD, MD 06/01/2022 10:45:31 AM This report has been signed electronically. Number of Addenda: 0 Note Initiated On: 06/01/2022 10:20 AM Estimated Blood Loss:   Estimated blood loss: none.      [facility]

## 2022-06-01 NOTE — Transfer of Care (Signed)
Immediate Anesthesia Transfer of Care Note  Patient: Sean Reyes.  Procedure(s) Performed: ESOPHAGOGASTRODUODENOSCOPY (EGD) WITH PROPOFOL  Patient Location: PACU  Anesthesia Type:General  Level of Consciousness: sedated  Airway & Oxygen Therapy: Patient Spontanous Breathing  Post-op Assessment: Report given to RN and Post -op Vital signs reviewed and stable  Post vital signs: Reviewed and stable  Last Vitals:  Vitals Value Taken Time  BP 135/85 06/01/22 1047  Temp 35.7 C 06/01/22 1047  Pulse 75 06/01/22 1049  Resp 12 06/01/22 1049  SpO2 99 % 06/01/22 1049  Vitals shown include unvalidated device data.  Last Pain:  Vitals:   06/01/22 1047  TempSrc: Temporal  PainSc: Asleep         Complications: No notable events documented.

## 2022-06-01 NOTE — H&P (Signed)
Wyline Mood, MD [address], [address], [place], [place], [address] [address], [place], [place], [address] Phone: [phone]  Fax: [phone]  Primary Care Physician:  Marjie Skiff, NP   Pre-Procedure History & Physical: HPI:  Sean Reyes. is a 82 y.o. 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   Hyperlipidemia    Hypertension    Myocardial infarction 12/2020   Nocturia    S/P ablation of atrial fibrillation    Ablative therapy   Sleep apnea    CPAP    Spinal stenosis    Tachycardia, unspecified     Past Surgical History:  Procedure Laterality Date   ABLATION     ANTERIOR LAT LUMBAR FUSION N/A 06/27/2017   Procedure: Anterior Lateral Lumbar Interbody  Fusion - Lumbar Two-Lumbar Three - Lumbar Three-Lumbar Four, Posterior Lumbar Interbody Fusion Lumbar Four- Five;  Surgeon: Donalee Citrin, MD;  Location: [facility] OR;  Service: Neurosurgery;  Laterality: N/A;  Anterior Lateral Lumbar Interbody  Fusion - Lumbar Two-Lumbar Three - Lumbar Three-Lumbar Four, Posterior Lumbar Interbody Fusion Lumbar Four- Five   BACK SURGERY     CARDIAC CATHETERIZATION     CARDIOVERSION N/A 08/29/2018   Procedure: CARDIOVERSION;  Surgeon: Lamar Blinks, MD;  Location: [facility] ORS;  Service: Cardiovascular;  Laterality: N/A;   CARDIOVERSION N/A 09/24/2018   Procedure: CARDIOVERSION;  Surgeon: Lamar Blinks, MD;  Location: [facility] ORS;  Service: Cardiovascular;  Laterality: N/A;   COLONOSCOPY WITH  PROPOFOL N/A 10/05/2015   Procedure: COLONOSCOPY WITH PROPOFOL;  Surgeon: Christena Deem, MD;  Location: [facility] ENDOSCOPY;  Service: Endoscopy;  Laterality: N/A;   COLONOSCOPY WITH PROPOFOL N/A 11/01/2020   Procedure: COLONOSCOPY WITH PROPOFOL;  Surgeon: Wyline Mood, MD;  Location: [facility] ENDOSCOPY;  Service: Gastroenterology;  Laterality: N/A;   CORONARY STENT INTERVENTION N/A 12/22/2020   Procedure: CORONARY STENT INTERVENTION;  Surgeon: Marcina Millard, MD;  Location: [facility] INVASIVE CV LAB;  Service: Cardiovascular;  Laterality: N/A;   ESOPHAGOGASTRODUODENOSCOPY N/A 11/01/2020   Procedure: ESOPHAGOGASTRODUODENOSCOPY (EGD);  Surgeon: Wyline Mood, MD;  Location: [facility] ENDOSCOPY;  Service: Gastroenterology;  Laterality: N/A;   ESOPHAGOGASTRODUODENOSCOPY (EGD) WITH PROPOFOL N/A 04/01/2018   Procedure: ESOPHAGOGASTRODUODENOSCOPY (EGD) WITH PROPOFOL;  Surgeon: Christena Deem, MD;  Location: [facility] ENDOSCOPY;  Service: Endoscopy;  Laterality: N/A;   EYE SURGERY     HERNIA REPAIR     JOINT REPLACEMENT Bilateral    hips  RT+  LEFT X2    LEFT HEART CATH AND CORONARY ANGIOGRAPHY N/A 12/22/2020   Procedure: LEFT HEART CATH AND CORONARY ANGIOGRAPHY;  Surgeon: Marcina Millard, MD;  Location: [facility] INVASIVE CV LAB;  Service: Cardiovascular;  Laterality: N/A;   LEFT HEART CATH AND CORONARY ANGIOGRAPHY Left 08/23/2021   Procedure: LEFT HEART CATH AND CORONARY ANGIOGRAPHY;  Surgeon: Lamar Blinks, MD;  Location: [facility] INVASIVE CV LAB;  Service: Cardiovascular;  Laterality: Left;   LUMBAR LAMINECTOMY/DECOMPRESSION MICRODISCECTOMY Left 09/13/2016   Procedure: Microdiscectomy - Lumbar two-three,  Lumbar three- - left;  Surgeon: Donalee Citrin, MD;  Location: [facility] OR;  Service: Neurosurgery;  Laterality: Left;   SPINAL CORD STIMULATOR INSERTION  07/08/2019   TONSILLECTOMY      Prior to Admission medications   Medication Sig Start Date End Date Taking? Authorizing Provider  amLODipine (NORVASC) 5 MG  tablet Take 1 tablet (5 mg total) by mouth at bedtime. 01/10/22 01/05/23 Yes Hammock, Sheri, NP  benazepril (LOTENSIN) 20 MG tablet Take 1 tablet (20 mg total) by mouth daily. 12/06/21  Yes Hammock, Lavonna Rua, NP  cyanocobalamin 1000 MCG tablet Take by mouth.   Yes [provider]  ferrous sulfate 325 (65 FE) MG EC tablet Take 325 mg by mouth daily with breakfast.   Yes [provider]  finasteride (PROSCAR) 5 MG tablet Take 1 tablet (5 mg total) by mouth daily. 04/19/22  Yes Sondra Come, MD  isosorbide mononitrate (IMDUR) 30 MG 24 hr tablet Take 30 mg by mouth daily. 12/20/20  Yes [provider]  metFORMIN (GLUCOPHAGE) 500 MG tablet Take 1 tablet (500 mg total) by mouth 2 (two) times daily with a meal. 04/04/22  Yes [name], Jolene T, NP  metoprolol succinate (TOPROL XL) 25 MG 24 hr tablet Take 0.5 tablets (12.5 mg total) by mouth daily. 02/17/22  Yes Hammock, Lavonna Rua, NP  omeprazole (PRILOSEC) 40 MG capsule Take 1 capsule (40 mg total) by mouth in the morning and at bedtime. 11/15/21  Yes Wyline Mood, MD  rosuvastatin (CRESTOR) 5 MG tablet Take 1 tablet (5 mg total) by mouth daily. 04/04/22  Yes [name], Jolene T, NP  solifenacin (VESICARE) 5 MG tablet Take 1 tablet (5 mg total) by mouth daily. 04/19/22  Yes Sondra Come, MD  tamsulosin (FLOMAX) 0.4 MG CAPS capsule Take 2 capsules (0.8 mg total) by mouth daily after supper. 04/19/22  Yes Sninsky, Laurette Schimke, MD  ELIQUIS 5 MG TABS tablet Take 1 tablet (5 mg total) by mouth 2 (two) times daily. 04/04/22   [name], Corrie Dandy T, NP  lidocaine (LIDODERM) 5 % Place 1 patch onto the skin daily. Remove & Discard patch within 12 hours or as directed by MD 04/04/22   Aura Dials T, NP    Allergies as of 06/01/2022 - Review Complete 06/01/2022  Allergen Reaction Noted   Levaquin [levofloxacin in d5w] Anaphylaxis and Shortness Of Breath 07/27/2014   Shellfish allergy Anaphylaxis 08/26/2014   Amiodarone Other (See Comments) 04/19/2015    Adhesive [tape] Other (See Comments) 04/19/2018    Family History  Problem Relation Age of Onset   Brain cancer Mother    Kidney disease Neg Hx    Prostate cancer Neg Hx    Kidney cancer Neg Hx    Bladder Cancer Neg Hx     Social History   Socioeconomic History   Marital status: Married    Spouse name: Elnita Maxwell    Number of children: 2   Years of education: Not on file   Highest education level: High school graduate  Occupational History   Occupation: retired   Tobacco Use   Smoking status: Never   Smokeless tobacco: Never  Vaping Use   Vaping Use: Never used  Substance and Sexual Activity   Alcohol use: No   Drug use: No   Sexual activity: Not on file  Other Topics Concern   Not  on file  Social History Narrative   MarriedGets regular exercise.      Lives in [place] with wife/ daughter. Never smoked; no alcohol. 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    Social Determinants of Health   Financial Resource Strain: Low Risk  (01/20/2022)   Overall Financial Resource Strain (CARDIA)    Difficulty of Paying Living Expenses: Not hard at all  Food Insecurity: No Food Insecurity (01/20/2022)   Hunger Vital Sign    Worried About Running Out of Food in the Last Year: Never true    Ran Out of Food in the Last Year: Never true  Transportation Needs: No Transportation Needs (01/20/2022)   PRAPARE - Administrator, Civil Service (Medical): No    Lack of Transportation (Non-Medical): No  Physical Activity: Insufficiently Active (01/20/2022)   Exercise Vital Sign    Days of Exercise per Week: 3 days    Minutes of Exercise per Session: 20 min  Stress: No Stress Concern Present (01/20/2022)   [employer] of Occupational Health - Occupational Stress Questionnaire    Feeling of Stress : Only a little  Social Connections: Moderately Integrated (01/20/2022)   Social Connection and Isolation Panel [NHANES]    Frequency of  Communication with Friends and Family: More than three times a week    Frequency of Social Gatherings with Friends and Family: More than three times a week    Attends Religious Services: More than 4 times per year    Active Member of [employer] or Organizations: No    Attends Banker Meetings: Never    Marital Status: Married  Catering manager Violence: Not At Risk (01/20/2022)   Humiliation, Afraid, Rape, and Kick questionnaire    Fear of Current or Ex-Partner: No    Emotionally Abused: No    Physically Abused: No    Sexually Abused: No    Review of Systems: See HPI, otherwise negative ROS  Physical Exam: There were no vitals taken for this visit. General:   Alert,  pleasant and cooperative in NAD Head:  Normocephalic and atraumatic. Neck:  Supple; no masses or thyromegaly. Lungs:  Clear throughout to auscultation, normal respiratory effort.    Heart:  +S1, +S2, Regular rate and rhythm, No edema. Abdomen:  Soft, nontender and nondistended. Normal bowel sounds, without guarding, and without rebound.   Neurologic:  Alert and  oriented x4;  grossly normal neurologically.  Impression/Plan: Sean Reyes. is here for an endoscopy  to be performed for  evaluation of dysphagia    Risks, benefits, limitations, and alternatives regarding endoscopy have been reviewed with the patient.  Questions have been answered.  All parties agreeable.   Wyline Mood, MD  06/01/2022, 9:45 AM

## 2022-06-02 ENCOUNTER — Encounter: Payer: Self-pay | Admitting: Gastroenterology

## 2022-06-13 ENCOUNTER — Telehealth: Payer: Medicare Other

## 2022-06-13 ENCOUNTER — Ambulatory Visit: Payer: Self-pay

## 2022-06-13 DIAGNOSIS — R809 Proteinuria, unspecified: Secondary | ICD-10-CM

## 2022-06-13 DIAGNOSIS — I152 Hypertension secondary to endocrine disorders: Secondary | ICD-10-CM

## 2022-06-13 DIAGNOSIS — E1143 Type 2 diabetes mellitus with diabetic autonomic (poly)neuropathy: Secondary | ICD-10-CM

## 2022-06-13 DIAGNOSIS — E1159 Type 2 diabetes mellitus with other circulatory complications: Secondary | ICD-10-CM

## 2022-06-13 DIAGNOSIS — N1831 Chronic kidney disease, stage 3a: Secondary | ICD-10-CM

## 2022-06-13 DIAGNOSIS — E1129 Type 2 diabetes mellitus with other diabetic kidney complication: Secondary | ICD-10-CM

## 2022-06-13 NOTE — Patient Instructions (Signed)
Please call the care guide team at [phone] if you need to cancel or reschedule your appointment.   If you are experiencing a Mental Health or Behavioral Health Crisis or need someone to talk to, please call the Suicide and Crisis Lifeline: 988 call the [place] National Suicide Prevention Lifeline: (803)251-4173 or TTY: 450-676-4964 TTY 2060517268) to talk to a trained counselor call 1-800-273-TALK (toll free, 24 hour hotline)   Following is a copy of the CCM Program Consent:  CCM service includes personalized support from designated clinical staff supervised by the physician, including individualized plan of care and coordination with other care providers 24/7 contact phone numbers for assistance for urgent and routine care needs. Service will only be billed when office clinical staff spend 20 minutes or more in a month to coordinate care. Only one practitioner may furnish and bill the service in a calendar month. The patient may stop CCM services at amy time (effective at the end of the month) by phone call to the office staff. 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 The patient states his blood sugars are doing well but he is having a lot of  weakness in his legs along with pain due to neuropathy. The patient states that he has started to go to the gym 3 times a week with his wife to see if he can build up some muscle and strength in his legs. He easily gets tired when ambulating and sometime more short of breath. He is pacing his activity. Does not use inhalers for his COPD due to the side effects of the inhalers. The patient states that he is  hoping by going to the gym he will see a positive difference. He is going to the specialist on 06-20-2022 to see what if anything can be done to help his back pain and weakness. He keeps working with his stimulator and just changed to a new program but he cannot tell a positive difference yet. He is frustrated due to not being able to achieve some kind of relief from his back pain and discomfort.  Reviewed medications with patient and discussed importance of medication adherence. The patient is compliant with medications;        Reviewed prescribed diet with patient heart healthy/ADA diet. Review of heart healthy/ADA diet. The patient is going to had his esophagus stretched on 06-01-2022. He cannot tell a difference. He says he has not had any episodes of choking but he is eating slow and monitoring for changes.  He has been having difficulty swallowing.  ; Counseled on importance of regular laboratory monitoring as prescribed. The patient has regular lab work. Reviewed with the patient that his  A1C is up from 6.5 to 7.0. Education on monitoring for acute changes and calling the pcp for recommendations.;        Discussed plans with patient for ongoing care management follow up and provided patient with direct contact information for care management team. Is appreciative of the CCM team calling and checking on him and helping him with his health and well being;      Provided patient with written educational materials related to hypo and hyperglycemia and importance of correct treatment;       Reviewed  scheduled/upcoming provider appointments including: 07-03-2022 at 1040 am;         Advised patient, providing education and rationale, to check cbg twice daily and when you have symptoms of low or high blood sugar and record        call provider for findings outside established parameters;       Review of patient status, including review of consultants reports, relevant laboratory and other test results, and medications completed;       Advised patient to discuss changes in her DM health and well being with provider;      Screening for signs and symptoms of depression related to chronic disease state;        Assessed social determinant of health barriers;         Symptom Management: Take medications as prescribed   Attend all scheduled provider appointments Call provider office for new concerns or questions  call the Suicide and Crisis Lifeline: 988 call the [place] National Suicide Prevention Lifeline: (434)629-9352 or TTY: 215-079-5083 TTY 947-704-7155) to talk to a trained counselor call 1-800-273-TALK (toll free, 24 hour hotline) if experiencing a Mental Health or Behavioral Health Crisis  check feet daily for cuts, sores or redness trim toenails straight across manage portion size wash and dry feet carefully every day wear comfortable, cotton socks wear comfortable, well-fitting shoes  Follow Up Plan: Telephone follow up appointment with care management team member scheduled for: 07-11-2022 at 230 pm       CCM Expected Outcome:  Monitor, Self-Manage and Reduce Symptoms of: CKD       Current Barriers:  Chronic Disease Management support and education needs related to effective management of CKD Lab Results  Component Value Date   CREATININE 1.18 05/25/2022   CREATININE 1.37 (H) 04/12/2022   CREATININE 1.41 (H) 04/04/2022    Lab Results  Component Value Date   CREATININE 1.18 05/25/2022   BUN 26 (H) 05/25/2022   NA 132 (L) 05/25/2022   K 3.9 05/25/2022   CL 98 05/25/2022    CO2 25 05/25/2022     Planned Interventions: Assessed the patient understanding of chronic kidney disease. The patient had labs. Reviewed labs with the patient. Denies any acute changes in his CKD or kidney function.    Evaluation of current treatment plan related to chronic kidney disease self management and patient's adherence to plan as established by provider.       Provided education to patient re: stroke prevention, s/s of heart attack and stroke    Reviewed prescribed diet heart healthy/ADA diet  Reviewed medications with patient and discussed importance of compliance. The patient is compliant with medications    Advised patient, providing education and rationale, to monitor blood pressure daily and record, calling PCP for findings outside established parameters    Discussed complications of poorly controlled blood pressure such as heart disease, stroke, circulatory complications, vision complications, kidney impairment, sexual dysfunction  Reviewed scheduled/upcoming provider appointments including: 07-03-2022 at 1040 am  Advised patient to discuss changes in his kidney function, or questions and concerns related to CKD with provider    Discussed plans with patient for ongoing care management follow up and provided patient with direct contact information for care management team    Screening for signs and symptoms of depression related to chronic disease state      Discussed the impact of chronic kidney disease on daily life and mental health and acknowledged and normalized feelings of disempowerment, fear, and frustration    Assessed social determinant of health barriers    Provided education on kidney disease progression    Engage patient in early, proactive and ongoing discussion about goals of care and what matters most to them    Support coping and stress management by recognizing current strategies and assist in developing new strategies such as mindfulness, journaling, relaxation  techniques, problem-solving. The patient is still dealing with ongoing chronic back pain and leg weakness. Sees provider on a regular basis. Denies falls. The patient will see a new specialist on 06-20-2022 to see what can possibly be done to help with his back pain and leg weakness. He and his wife are going with 4 other couples to the beach this weekend and he is excited about that but also concerned due to his health. Reflective listening and support given.  Symptom Management: Take medications as prescribed   Attend all scheduled provider appointments Call provider office for new concerns or questions  call the Suicide and Crisis Lifeline: 988 call the [place] National Suicide Prevention Lifeline: 410-450-7293 or TTY: 2530193381 TTY 7133742466) to talk to a trained counselor call 1-800-273-TALK (toll free, 24 hour hotline) if experiencing a Mental Health or Behavioral Health Crisis   Follow Up Plan: Telephone follow up appointment with care management team member scheduled for: 07-11-2022 at 230 pm       CCM Expected Outcome:  Monitor, Self-Manage, and Reduce Symptoms of Hypertension       Current Barriers:  Knowledge Deficits related to fluctuations in blood pressures and the need to have normalized blood pressures to prevent the risk of heart attack and stroke Chronic Disease Management support and education needs related to effective management of HTN BP Readings from Last 3 Encounters:  06/01/22 (!) 152/87  05/26/22 (!) 161/72  04/19/22 135/83     Planned Interventions: Evaluation of current treatment plan related to hypertension self management and patient's adherence to plan as established by provider. The patient with some elevated blood pressures. Feels like it is related to his back pain and stress. He is frustrated as he can not get relief from the back pain and discomfort. He had a procedure recently to stretch his esophagus and he cannot tell a difference. The patient is  planning on getting a follow up as he wants to discuss the biopsy they got and did not send off. Education and support given.  Provided education to patient re: stroke prevention, s/s of heart attack and stroke; Reviewed prescribed diet heart healthy/ADA diet. The patient is compliant with dietary restrictions. The patient has decided to stay away from Taco salads.  Reviewed medications with patient and discussed importance of compliance. The patient states compliance with medications.  Discussed plans with patient for ongoing care management follow up and provided patient with direct contact information for care management team; Advised patient, providing education and rationale, to monitor blood pressure daily and record, calling PCP for findings outside established  parameters.   Reviewed scheduled/upcoming provider appointments including:  next pcp appointment on 07-03-2022 at 1040 am Advised patient to discuss changes in his HTN, or heart health  with provider; Provided education on prescribed diet Heart healthy/ADA diet ;  Discussed complications of poorly controlled blood pressure such as heart disease, stroke, circulatory complications, vision complications, kidney impairment, sexual dysfunction;  Screening for signs and symptoms of depression related to chronic disease state;  Assessed social determinant of health barriers;   Symptom Management: Take medications as prescribed   Attend all scheduled provider appointments Call provider office for new concerns or questions  call the Suicide and Crisis Lifeline: 988 call the [place] National Suicide Prevention Lifeline: 862-136-8124 or TTY: 6098298850 TTY (204)733-2760) to talk to a trained counselor call 1-800-273-TALK (toll free, 24 hour hotline) if experiencing a Mental Health or Behavioral Health Crisis  check blood pressure 3 times per week learn about high blood pressure keep a blood pressure log take blood pressure log to all doctor  appointments call doctor for signs and symptoms of high blood pressure develop an action plan for high blood pressure keep all doctor appointments take medications for blood pressure exactly as prescribed report new symptoms to your doctor  Follow Up Plan: Telephone follow up appointment with care management team member scheduled for: 07-11-2022 at 230 pm          Patient verbalizes understanding of instructions and care plan provided today and agrees to view in MyChart. Active MyChart status and patient understanding of how to access instructions and care plan via MyChart confirmed with patient.  Telephone follow up appointment with care management team member scheduled for: 07-11-2022 at 230 pm

## 2022-06-13 NOTE — Chronic Care Management (AMB) (Signed)
Chronic Care Management   CCM RN Visit Note  06/13/2022 Name: Sean Reyes. MRN: [id number] DOB: [birth year]  Subjective: Sean Reyes. is a 82 y.o. year old male who is a primary care patient of [name], Dorie Rank, NP. The patient was referred to the Chronic Care Management team for assistance with care management needs subsequent to provider initiation of CCM services and plan of care.    Today's Visit:  Engaged with patient by telephone for follow up visit.        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 The patient states that he has started to go to the gym 3 times a week with his wife to see if he can build up some muscle and strength in his legs. He easily gets tired when ambulating and sometime more short of breath. He is pacing his activity. Does not use inhalers for his COPD due to the side effects of the inhalers. The patient states that he is  hoping by going to the gym he will see a positive difference. He is going to the specialist on 06-20-2022 to see what if anything can be done to help his back pain and weakness. He keeps working with his stimulator and just changed to a new program but he cannot tell a positive difference yet. He is frustrated due to not being able to achieve  some kind of relief from his back pain and discomfort.  Reviewed medications with patient and discussed importance of medication adherence. The patient is compliant with medications;        Reviewed prescribed diet with patient heart healthy/ADA diet. Review of heart healthy/ADA diet. The patient is going to had his esophagus stretched on 06-01-2022. He cannot tell a difference. He says he has not had any episodes of choking but he is eating slow and monitoring for changes.  He has been having difficulty swallowing.  ; Counseled on importance of regular laboratory monitoring as prescribed. The patient has regular lab work. Reviewed with the patient that his A1C is up from 6.5 to 7.0. Education on monitoring for acute changes and calling the pcp for recommendations.;        Discussed plans with patient for ongoing care management follow up and provided patient with direct contact information for care management team. Is appreciative of the CCM team calling and checking on him and helping him with his health and well being;      Provided patient with written educational materials related to hypo and hyperglycemia and importance of correct treatment;       Reviewed scheduled/upcoming provider appointments including: 07-03-2022 at 1040 am;         Advised patient, providing education and rationale, to  check cbg twice daily and when you have symptoms of low or high blood sugar and record        call provider for findings outside established parameters;       Review of patient status, including review of consultants reports, relevant laboratory and other test results, and medications completed;       Advised patient to discuss changes in her DM health and well being with provider;      Screening for signs and symptoms of depression related to chronic disease state;        Assessed social determinant of health barriers;         Symptom Management: Take medications as prescribed   Attend all scheduled provider  appointments Call provider office for new concerns or questions  call the Suicide and Crisis Lifeline: 988 call the [place] National Suicide Prevention Lifeline: (930) 244-3656 or TTY: (986)608-4930 TTY 740 849 3284) to talk to a trained counselor call 1-800-273-TALK (toll free, 24 hour hotline) if experiencing a Mental Health or Behavioral Health Crisis  check feet daily for cuts, sores or redness trim toenails straight across manage portion size wash and dry feet carefully every day wear comfortable, cotton socks wear comfortable, well-fitting shoes  Follow Up Plan: Telephone follow up appointment with care management team member scheduled for: 07-11-2022 at 230 pm       CCM Expected Outcome:  Monitor, Self-Manage and Reduce Symptoms of: CKD       Current Barriers:  Chronic Disease Management support and education needs related to effective management of CKD Lab Results  Component Value Date   CREATININE 1.18 05/25/2022   CREATININE 1.37 (H) 04/12/2022   CREATININE 1.41 (H) 04/04/2022    Lab Results  Component Value Date   CREATININE 1.18 05/25/2022   BUN 26 (H) 05/25/2022   NA 132 (L) 05/25/2022   K 3.9 05/25/2022   CL 98 05/25/2022   CO2 25 05/25/2022     Planned Interventions: Assessed the patient understanding of chronic kidney disease. The patient had labs. Reviewed labs with the patient. Denies any acute changes in his CKD or kidney function.    Evaluation of current treatment plan related to chronic kidney disease self management and patient's adherence to plan as established by provider.       Provided education to patient re: stroke prevention, s/s of heart attack and stroke    Reviewed prescribed diet heart healthy/ADA diet  Reviewed medications with patient and discussed importance of compliance. The patient is compliant with medications    Advised patient, providing education and rationale, to monitor blood pressure daily and record, calling PCP for findings outside  established parameters    Discussed complications of poorly controlled blood pressure such as heart disease, stroke, circulatory complications, vision complications, kidney impairment, sexual dysfunction    Reviewed scheduled/upcoming provider appointments including: 07-03-2022 at 1040 am  Advised patient to discuss changes in his kidney function, or questions and concerns related to CKD with provider    Discussed plans with patient for ongoing care management follow up and provided patient with direct contact information for care management team    Screening for signs and symptoms of depression related to chronic disease state      Discussed the impact of chronic kidney disease on daily life and mental health and acknowledged and normalized feelings of disempowerment, fear, and frustration    Assessed social determinant of health barriers    Provided education on kidney disease progression    Engage patient  in early, proactive and ongoing discussion about goals of care and what matters most to them    Support coping and stress management by recognizing current strategies and assist in developing new strategies such as mindfulness, journaling, relaxation techniques, problem-solving. The patient is still dealing with ongoing chronic back pain and leg weakness. Sees provider on a regular basis. Denies falls. The patient will see a new specialist on 06-20-2022 to see what can possibly be done to help with his back pain and leg weakness. He and his wife are going with 4 other couples to the beach this weekend and he is excited about that but also concerned due to his health. Reflective listening and support given.  Symptom Management: Take medications as prescribed   Attend all scheduled provider appointments Call provider office for new concerns or questions  call the Suicide and Crisis Lifeline: 988 call the [place] National Suicide Prevention Lifeline: 430-099-0671 or TTY: 779-171-4691 TTY 850-273-9990)  to talk to a trained counselor call 1-800-273-TALK (toll free, 24 hour hotline) if experiencing a Mental Health or Behavioral Health Crisis   Follow Up Plan: Telephone follow up appointment with care management team member scheduled for: 07-11-2022 at 230 pm       CCM Expected Outcome:  Monitor, Self-Manage, and Reduce Symptoms of Hypertension       Current Barriers:  Knowledge Deficits related to fluctuations in blood pressures and the need to have normalized blood pressures to prevent the risk of heart attack and stroke Chronic Disease Management support and education needs related to effective management of HTN BP Readings from Last 3 Encounters:  06/01/22 (!) 152/87  05/26/22 (!) 161/72  04/19/22 135/83     Planned Interventions: Evaluation of current treatment plan related to hypertension self management and patient's adherence to plan as established by provider. The patient with some elevated blood pressures. Feels like it is related to his back pain and stress. He is frustrated as he can not get relief from the back pain and discomfort. He had a procedure recently to stretch his esophagus and he cannot tell a difference. The patient is planning on getting a follow up as he wants to discuss the biopsy they got and did not send off. Education and support given.  Provided education to patient re: stroke prevention, s/s of heart attack and stroke; Reviewed prescribed diet heart healthy/ADA diet. The patient is compliant with dietary restrictions. The patient has decided to stay away from [employer] salads.  Reviewed medications with patient and discussed importance of compliance. The patient states compliance with medications.  Discussed plans with patient for ongoing care management follow up and provided patient with direct contact information for care management team; Advised patient, providing education and rationale, to monitor blood pressure daily and record, calling PCP for findings outside  established parameters.   Reviewed scheduled/upcoming provider appointments including:  next pcp appointment on 07-03-2022 at 1040 am Advised patient to discuss changes in his HTN, or heart health  with provider; Provided education on prescribed diet Heart healthy/ADA diet ;  Discussed complications of poorly controlled blood pressure such as heart disease, stroke, circulatory complications, vision complications, kidney impairment, sexual dysfunction;  Screening for signs and symptoms of depression related to chronic disease state;  Assessed social determinant of health barriers;   Symptom Management: Take medications as prescribed   Attend all scheduled provider appointments Call provider office for new concerns or questions  call the Suicide and Crisis Lifeline: 988 call the [place] National Suicide Prevention  Lifeline: 201 592 3655 or TTY: (616)447-2455 TTY (317)251-1183) to talk to a trained counselor call 1-800-273-TALK (toll free, 24 hour hotline) if experiencing a Mental Health or Behavioral Health Crisis  check blood pressure 3 times per week learn about high blood pressure keep a blood pressure log take blood pressure log to all doctor appointments call doctor for signs and symptoms of high blood pressure develop an action plan for high blood pressure keep all doctor appointments take medications for blood pressure exactly as prescribed report new symptoms to your doctor  Follow Up Plan: Telephone follow up appointment with care management team member scheduled for: 07-11-2022 at 230 pm          Plan:Telephone follow up appointment with care management team member scheduled for:  07-11-2022 at 230 pm  Alto Denver RN, MSN, CCM RN Care Manager  Chronic Care Management Direct Number: [phone]

## 2022-06-18 NOTE — Progress Notes (Unsigned)
Referring Physician:  No referring provider defined for this encounter.  Primary Physician:  Marjie Skiff, NP  History of Present Illness: 06/20/2022 [name] has a history of chronic anemia, MI, ablation for afib, CKD 3, DM, HTN, hyperlipidemia, OSA, and history of c.diff.   Last seen by Danielle on 08/02/21. History of multiple lumbar surgeries with persistent pain. Further surgery was not recommended by Dr. Myer Haff and it was recommended that he follow up with Dr. Cherylann Ratel.   He had SCS in 2021 with Dr. Claudette Laws and he does not feel like it's helping as much as it was previously.   He has 5-6 week history of constant LBP that is worse on left. He has some relief with sitting. Pain is worse with standing and walking. No new leg pain. He has history of neuropathy in his legs.   He's not had SCS reprogrammed for years.   He is on ELIQUIS.   Bowel/Bladder Dysfunction: none  Conservative measures:  Physical therapy: has not participated in Multimodal medical therapy including regular antiinflammatories:   Injections: has not received epidural steroid injections  Past Surgery:  SCS 2021 with Dr. Claudette Laws L5-S1 posterior lumbar interbody fusion (04/29/18)  L2-5 fusion (06/27/17) L2-3 lumbar microdiscectomy (09/2016) Right hip replacement -2 Left hip replacement   Sean Reyes. has no symptoms of cervical myelopathy.  The symptoms are causing a significant impact on the patient's life.   Review of Systems:  A 10 point review of systems is negative, except for the pertinent positives and negatives detailed in the HPI.  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   Hyperlipidemia    Hypertension    Myocardial infarction (HCC) 12/2020   Nocturia    S/P ablation of atrial fibrillation    Ablative therapy   Sleep apnea    CPAP    Spinal stenosis    Tachycardia, unspecified     Past Surgical History: Past Surgical History:  Procedure Laterality Date   ABLATION     ANTERIOR LAT LUMBAR FUSION N/A 06/27/2017   Procedure: Anterior Lateral Lumbar Interbody  Fusion - Lumbar Two-Lumbar Three - Lumbar Three-Lumbar Four, Posterior Lumbar Interbody Fusion Lumbar Four- Five;  Surgeon: Donalee Citrin, MD;  Location: [facility] OR;  Service: Neurosurgery;  Laterality: N/A;  Anterior Lateral Lumbar Interbody  Fusion - Lumbar Two-Lumbar Three - Lumbar Three-Lumbar Four, Posterior Lumbar Interbody Fusion Lumbar Four- Five   BACK SURGERY     CARDIAC CATHETERIZATION     CARDIOVERSION N/A 08/29/2018   Procedure: CARDIOVERSION;  Surgeon: Lamar Blinks, MD;  Location: [facility] ORS;  Service: Cardiovascular;  Laterality: N/A;   CARDIOVERSION N/A 09/24/2018   Procedure: CARDIOVERSION;  Surgeon: Lamar Blinks, MD;  Location: [facility] ORS;  Service: Cardiovascular;  Laterality: N/A;   COLONOSCOPY WITH PROPOFOL N/A 10/05/2015   Procedure: COLONOSCOPY WITH PROPOFOL;  Surgeon: Christena Deem, MD;  Location: [facility] ENDOSCOPY;  Service: Endoscopy;  Laterality: N/A;   COLONOSCOPY WITH PROPOFOL N/A 11/01/2020   Procedure: COLONOSCOPY WITH PROPOFOL;  Surgeon: Wyline Mood, MD;  Location: [facility] ENDOSCOPY;  Service: Gastroenterology;  Laterality: N/A;   CORONARY STENT INTERVENTION N/A 12/22/2020   Procedure: CORONARY STENT INTERVENTION;  Surgeon: Marcina Millard, MD;  Location: [facility] INVASIVE CV LAB;  Service: Cardiovascular;  Laterality: N/A;   ESOPHAGOGASTRODUODENOSCOPY N/A 11/01/2020   Procedure: ESOPHAGOGASTRODUODENOSCOPY  (EGD);  Surgeon: Wyline Mood, MD;  Location: [facility] ENDOSCOPY;  Service: Gastroenterology;  Laterality: N/A;   ESOPHAGOGASTRODUODENOSCOPY (EGD) WITH PROPOFOL N/A 04/01/2018   Procedure: ESOPHAGOGASTRODUODENOSCOPY (EGD) WITH PROPOFOL;  Surgeon: Christena Deem, MD;  Location: [facility] ENDOSCOPY;  Service: Endoscopy;  Laterality: N/A;   ESOPHAGOGASTRODUODENOSCOPY (EGD) WITH PROPOFOL N/A 06/01/2022   Procedure: ESOPHAGOGASTRODUODENOSCOPY (EGD) WITH PROPOFOL;  Surgeon: Wyline Mood, MD;  Location: [facility] ENDOSCOPY;  Service: Gastroenterology;  Laterality: N/A;   EYE SURGERY     HERNIA REPAIR     JOINT REPLACEMENT Bilateral    hips  RT+  LEFT X2    LEFT HEART CATH AND CORONARY ANGIOGRAPHY N/A 12/22/2020   Procedure: LEFT HEART CATH AND CORONARY ANGIOGRAPHY;  Surgeon: Marcina Millard, MD;  Location: [facility] INVASIVE CV LAB;  Service: Cardiovascular;  Laterality: N/A;   LEFT HEART CATH AND CORONARY ANGIOGRAPHY Left 08/23/2021   Procedure: LEFT HEART CATH AND CORONARY ANGIOGRAPHY;  Surgeon: Lamar Blinks, MD;  Location: [facility] INVASIVE CV LAB;  Service: Cardiovascular;  Laterality: Left;   LUMBAR LAMINECTOMY/DECOMPRESSION MICRODISCECTOMY Left 09/13/2016   Procedure: Microdiscectomy - Lumbar two-three,  Lumbar three- - left;  Surgeon: Donalee Citrin, MD;  Location: [facility] OR;  Service: Neurosurgery;  Laterality: Left;   SPINAL CORD STIMULATOR INSERTION  07/08/2019   TONSILLECTOMY      Allergies: Allergies as of 06/20/2022 - Review Complete 06/20/2022  Allergen Reaction Noted   Levaquin [levofloxacin in d5w] Anaphylaxis and Shortness Of Breath 07/27/2014   Shellfish allergy Anaphylaxis 08/26/2014   Amiodarone Other (See Comments) 04/19/2015   Adhesive [tape] Other (See Comments) 04/19/2018    Medications: Outpatient Encounter Medications as of 06/20/2022  Medication Sig   amLODipine (NORVASC) 5 MG tablet Take 1 tablet (5 mg total) by mouth at bedtime.   benazepril (LOTENSIN) 20 MG tablet Take 1 tablet (20  mg total) by mouth daily.   cyanocobalamin 1000 MCG tablet Take by mouth.   ELIQUIS 5 MG TABS tablet Take 1 tablet (5 mg total) by mouth 2 (two) times daily.   ferrous sulfate 325 (65 FE) MG EC tablet Take 325 mg by mouth daily with breakfast.   finasteride (PROSCAR) 5 MG tablet Take 1 tablet (5 mg total) by mouth daily.   isosorbide mononitrate (IMDUR) 30 MG 24 hr tablet Take 30 mg by mouth daily.   lidocaine (LIDODERM) 5 % Place 1 patch onto the skin daily. Remove & Discard patch within 12 hours or as directed by MD   metFORMIN (GLUCOPHAGE) 500 MG tablet Take 1 tablet (500 mg total) by mouth 2 (two) times daily with a meal.   metoprolol succinate (TOPROL XL) 25 MG 24 hr tablet Take 0.5 tablets (12.5 mg total) by mouth daily.   omeprazole (PRILOSEC) 40 MG capsule Take 1 capsule (40 mg total) by mouth in the morning and at bedtime.   rosuvastatin (CRESTOR) 5 MG tablet Take 1 tablet (5 mg total) by mouth daily.   solifenacin (VESICARE) 5 MG tablet Take 1 tablet (5 mg total) by mouth  daily.   tamsulosin (FLOMAX) 0.4 MG CAPS capsule Take 2 capsules (0.8 mg total) by mouth daily after supper.   No facility-administered encounter medications on file as of 06/20/2022.    Social History: Social History   Tobacco Use   Smoking status: Never   Smokeless tobacco: Never  Vaping Use   Vaping Use: Never used  Substance Use Topics   Alcohol use: No   Drug use: No    Family Medical History: Family History  Problem Relation Age of Onset   Brain cancer Mother    Kidney disease Neg Hx    Prostate cancer Neg Hx    Kidney cancer Neg Hx    Bladder Cancer Neg Hx     Physical Examination: There were no vitals filed for this visit.  General: Patient is well developed, well nourished, calm, collected, and in no apparent distress. Attention to examination is appropriate.  Respiratory: Patient is breathing without any difficulty.   NEUROLOGICAL:     Awake, alert, oriented to person, place, and  time.  Speech is clear and fluent. Fund of knowledge is appropriate.   Cranial Nerves: Pupils equal round and reactive to light.  Facial tone is symmetric.    TL incisions are well healed.   No abnormal lesions on exposed skin.   Strength: Side Biceps Triceps Deltoid Interossei Grip Wrist Ext. Wrist Flex.  R 5 5 5 5 5 5 5   L 5 5 5 5 5 5 5    Side Iliopsoas Quads Hamstring PF DF EHL  R 5 5 5 5 5 5   L 5 5 5 5 5 5    Reflexes are 2+ and symmetric at the biceps, triceps, brachioradialis, patella and achilles.   Hoffman's is absent.  Clonus is not present.   Bilateral upper and lower extremity sensation is intact to light touch.     He ambulates with a crutch.   Medical Decision Making  Imaging: No updated imaging.    Assessment and Plan: Mr. Garrand is a pleasant 82 y.o. male has history of multiple lumbar surgeries including SCS. 4-6 week history of increased LBP, left > right, that is worse with standing and walking. No new leg symptoms- he has known neuropathy.   No updated imaging done. Reviewed with rep- initially his stim was put in at top of T8 and top of T9.  Treatment options discussed with patient and following plan made:   - Thoracic and lumbar xrays ordered to look at placement of SCS.  - [employer] rep Linus Galas was in to see him for reprogramming. Hopefully this will get him better coverage.  - Will message him with xray results along with further follow up.   I spent a total of 25 minutes in face-to-face and non-face-to-face activities related to this patient's care today including review of outside records, review of imaging, review of symptoms, physical exam, discussion of differential diagnosis, discussion of treatment options, and documentation.   Drake Leach PA-C Dept. of Neurosurgery

## 2022-06-20 ENCOUNTER — Ambulatory Visit (INDEPENDENT_AMBULATORY_CARE_PROVIDER_SITE_OTHER): Payer: Medicare Other | Admitting: Orthopedic Surgery

## 2022-06-20 ENCOUNTER — Encounter: Payer: Self-pay | Admitting: Orthopedic Surgery

## 2022-06-20 ENCOUNTER — Ambulatory Visit
Admission: RE | Admit: 2022-06-20 | Discharge: 2022-06-20 | Disposition: A | Discharge status: Home / Self Care | Payer: Medicare Other | Source: Ambulatory Visit | Attending: Orthopedic Surgery | Admitting: Orthopedic Surgery

## 2022-06-20 VITALS — BP 140/78 | Ht 73.0 in | Wt 184.0 lb

## 2022-06-20 DIAGNOSIS — Z9689 Presence of other specified functional implants: Secondary | ICD-10-CM

## 2022-06-20 DIAGNOSIS — Z981 Arthrodesis status: Secondary | ICD-10-CM

## 2022-06-20 DIAGNOSIS — M545 Low back pain, unspecified: Secondary | ICD-10-CM

## 2022-06-20 DIAGNOSIS — M4324 Fusion of spine, thoracic region: Secondary | ICD-10-CM | POA: Diagnosis not present

## 2022-06-20 NOTE — Patient Instructions (Addendum)
It was so nice to see you today. Thank you so much for coming in.    I ordered xrays of your mid and lower back. You can get these at [facility] (building with the white pillars) off of [address]. The address is [address] [place], [place] [address]. You do not need any appointment. I will message you with results.   Hopefully, you can get some improved coverage with reprogramming.   Will plan follow up once I review your xrays.   Please do not hesitate to call if you have any questions or concerns. You can also message me in MyChart.    Drake Leach PA-C [phone]  History

## 2022-06-26 ENCOUNTER — Encounter: Payer: Self-pay | Admitting: Orthopedic Surgery

## 2022-06-26 DIAGNOSIS — Z981 Arthrodesis status: Secondary | ICD-10-CM

## 2022-06-26 DIAGNOSIS — Z9689 Presence of other specified functional implants: Secondary | ICD-10-CM

## 2022-06-26 DIAGNOSIS — M545 Low back pain, unspecified: Secondary | ICD-10-CM

## 2022-07-01 NOTE — Patient Instructions (Signed)
Diabetes Mellitus Basics  Diabetes mellitus, or diabetes, is a long-term (chronic) disease. It occurs when the body does not properly use sugar (glucose) that is released from food after you eat. Diabetes mellitus may be caused by one or both of these problems: Your pancreas does not make enough of a hormone called insulin. Your body does not react in a normal way to the insulin that it makes. Insulin lets glucose enter cells in your body. This gives you energy. If you have diabetes, glucose cannot get into cells. This causes high blood glucose (hyperglycemia). How to treat and manage diabetes You may need to take insulin or other diabetes medicines daily to keep your glucose in balance. If you are prescribed insulin, you will learn how to give yourself insulin by injection. You may need to adjust the amount of insulin you take based on the foods that you eat. You will need to check your blood glucose levels using a glucose monitor as told by your health care provider. The readings can help determine if you have low or high blood glucose. Generally, you should have these blood glucose levels: Before meals (preprandial): 80-130 mg/dL (4.4-7.2 mmol/L). After meals (postprandial): below 180 mg/dL (10 mmol/L). Hemoglobin A1c (HbA1c) level: less than 7%. Your health care provider will set treatment goals for you. Keep all follow-up visits. This is important. Follow these instructions at home: Diabetes medicines Take your diabetes medicines every day as told by your health care provider. List your diabetes medicines here: Name of medicine: ______________________________ Amount (dose): _______________ Time (a.m./p.m.): _______________ Notes: ___________________________________ Name of medicine: ______________________________ Amount (dose): _______________ Time (a.m./p.m.): _______________ Notes: ___________________________________ Name of medicine: ______________________________ Amount (dose):  _______________ Time (a.m./p.m.): _______________ Notes: ___________________________________ Insulin If you use insulin, list the types of insulin you use here: Insulin type: ______________________________ Amount (dose): _______________ Time (a.m./p.m.): _______________Notes: ___________________________________ Insulin type: ______________________________ Amount (dose): _______________ Time (a.m./p.m.): _______________ Notes: ___________________________________ Insulin type: ______________________________ Amount (dose): _______________ Time (a.m./p.m.): _______________ Notes: ___________________________________ Insulin type: ______________________________ Amount (dose): _______________ Time (a.m./p.m.): _______________ Notes: ___________________________________ Insulin type: ______________________________ Amount (dose): _______________ Time (a.m./p.m.): _______________ Notes: ___________________________________ Managing blood glucose  Check your blood glucose levels using a glucose monitor as told by your health care provider. Write down the times that you check your glucose levels here: Time: _______________ Notes: ___________________________________ Time: _______________ Notes: ___________________________________ Time: _______________ Notes: ___________________________________ Time: _______________ Notes: ___________________________________ Time: _______________ Notes: ___________________________________ Time: _______________ Notes: ___________________________________  Low blood glucose Low blood glucose (hypoglycemia) is when glucose is at or below 70 mg/dL (3.9 mmol/L). Symptoms may include: Feeling: Hungry. Sweaty and clammy. Irritable or easily upset. Dizzy. Sleepy. Having: A fast heartbeat. A headache. A change in your vision. Numbness around the mouth, lips, or tongue. Having trouble with: Moving (coordination). Sleeping. Treating low blood glucose To treat low blood  glucose, eat or drink something containing sugar right away. If you can think clearly and swallow safely, follow the 15:15 rule: Take 15 grams of a fast-acting carb (carbohydrate), as told by your health care provider. Some fast-acting carbs are: Glucose tablets: take 3-4 tablets. Hard candy: eat 3-5 pieces. Fruit juice: drink 4 oz (120 mL). Regular (not diet) soda: drink 4-6 oz (120-180 mL). Honey or sugar: eat 1 Tbsp (15 mL). Check your blood glucose levels 15 minutes after you take the carb. If your glucose is still at or below 70 mg/dL (3.9 mmol/L), take 15 grams of a carb again. If your glucose does not go above 70 mg/dL (3.9 mmol/L) after   3 tries, get help right away. After your glucose goes back to normal, eat a meal or a snack within 1 hour. Treating very low blood glucose If your glucose is at or below 54 mg/dL (3 mmol/L), you have very low blood glucose (severe hypoglycemia). This is an emergency. Do not wait to see if the symptoms will go away. Get medical help right away. Call your local emergency services (911 in the [place] Do not drive yourself to the hospital. Questions to ask your health care provider Should I talk with a diabetes educator? What equipment will I need to care for myself at home? What diabetes medicines do I need? When should I take them? How often do I need to check my blood glucose levels? What number can I call if I have questions? When is my follow-up visit? Where can I find a support group for people with diabetes? Where to find more information American Diabetes Association: www.diabetes.org Association of Diabetes Care and Education Specialists: www.diabeteseducator.org Contact a health care provider if: Your blood glucose is at or above 240 mg/dL (13.3 mmol/L) for 2 days in a row. You have been sick or have had a fever for 2 days or more, and you are not getting better. You have any of these problems for more than 6 hours: You cannot eat or  drink. You feel nauseous. You vomit. You have diarrhea. Get help right away if: Your blood glucose is lower than 54 mg/dL (3 mmol/L). You get confused. You have trouble thinking clearly. You have trouble breathing. These symptoms may represent a serious problem that is an emergency. Do not wait to see if the symptoms will go away. Get medical help right away. Call your local emergency services (911 in the [place] Do not drive yourself to the hospital. Summary Diabetes mellitus is a chronic disease that occurs when the body does not properly use sugar (glucose) that is released from food after you eat. Take insulin and diabetes medicines as told. Check your blood glucose every day, as often as told. Keep all follow-up visits. This is important. This information is not intended to replace advice given to you by your health care provider. Make sure you discuss any questions you have with your health care provider. Document Revised: 06/03/2019 Document Reviewed: 06/03/2019 Elsevier Patient Education  2023 Elsevier Inc.  

## 2022-07-03 ENCOUNTER — Encounter: Payer: Self-pay | Admitting: Nurse Practitioner

## 2022-07-03 ENCOUNTER — Ambulatory Visit (INDEPENDENT_AMBULATORY_CARE_PROVIDER_SITE_OTHER): Payer: Medicare Other | Admitting: Nurse Practitioner

## 2022-07-03 VITALS — BP 138/72 | HR 58 | Temp 97.8°F | Ht 72.99 in | Wt 184.0 lb

## 2022-07-03 DIAGNOSIS — G4733 Obstructive sleep apnea (adult) (pediatric): Secondary | ICD-10-CM

## 2022-07-03 DIAGNOSIS — G894 Chronic pain syndrome: Secondary | ICD-10-CM | POA: Diagnosis not present

## 2022-07-03 DIAGNOSIS — E1169 Type 2 diabetes mellitus with other specified complication: Secondary | ICD-10-CM

## 2022-07-03 DIAGNOSIS — J449 Chronic obstructive pulmonary disease, unspecified: Secondary | ICD-10-CM

## 2022-07-03 DIAGNOSIS — E1143 Type 2 diabetes mellitus with diabetic autonomic (poly)neuropathy: Secondary | ICD-10-CM | POA: Diagnosis not present

## 2022-07-03 DIAGNOSIS — E039 Hypothyroidism, unspecified: Secondary | ICD-10-CM | POA: Diagnosis not present

## 2022-07-03 DIAGNOSIS — I25110 Atherosclerotic heart disease of native coronary artery with unstable angina pectoris: Secondary | ICD-10-CM

## 2022-07-03 DIAGNOSIS — N1831 Chronic kidney disease, stage 3a: Secondary | ICD-10-CM

## 2022-07-03 DIAGNOSIS — E785 Hyperlipidemia, unspecified: Secondary | ICD-10-CM | POA: Diagnosis not present

## 2022-07-03 DIAGNOSIS — R809 Proteinuria, unspecified: Secondary | ICD-10-CM | POA: Diagnosis not present

## 2022-07-03 DIAGNOSIS — I152 Hypertension secondary to endocrine disorders: Secondary | ICD-10-CM

## 2022-07-03 DIAGNOSIS — R7989 Other specified abnormal findings of blood chemistry: Secondary | ICD-10-CM

## 2022-07-03 DIAGNOSIS — E1159 Type 2 diabetes mellitus with other circulatory complications: Secondary | ICD-10-CM

## 2022-07-03 DIAGNOSIS — I739 Peripheral vascular disease, unspecified: Secondary | ICD-10-CM | POA: Diagnosis not present

## 2022-07-03 DIAGNOSIS — D649 Anemia, unspecified: Secondary | ICD-10-CM

## 2022-07-03 DIAGNOSIS — D6869 Other thrombophilia: Secondary | ICD-10-CM

## 2022-07-03 DIAGNOSIS — I480 Paroxysmal atrial fibrillation: Secondary | ICD-10-CM

## 2022-07-03 DIAGNOSIS — E1129 Type 2 diabetes mellitus with other diabetic kidney complication: Secondary | ICD-10-CM

## 2022-07-03 LAB — BAYER DCA HB A1C WAIVED: HB A1C (BAYER DCA - WAIVED): 7 % — ABNORMAL HIGH (ref 4.8–5.6)

## 2022-07-03 NOTE — Assessment & Plan Note (Signed)
Chronic, ongoing with A1c 7% today and urine ALB 21 (February 2024).  Continues to have stable BS control.  Will continue current medication regimen and adjust as needed.  Recommend he check BS at least 3 times daily.  Continue Benazepril for kidney protection. - Vaccinations up to date - ACE and Statin on board - Foot and eye exam up to date.

## 2022-07-03 NOTE — Assessment & Plan Note (Signed)
Waxes and wanes -- recheck today and initiate medication as needed. 

## 2022-07-03 NOTE — Assessment & Plan Note (Signed)
Chronic, stable.  Followed by pulmonary at this time, continue this collaboration.  He is not using inhalers.  Reports some improvement in SOB with change in cardiac medications. 

## 2022-07-03 NOTE — Assessment & Plan Note (Signed)
Patient on Eliquis with A-fib, monitor CBC regularly. 

## 2022-07-03 NOTE — Assessment & Plan Note (Signed)
Chronic, ongoing -- continue 100% use of CPAP. 

## 2022-07-03 NOTE — Assessment & Plan Note (Signed)
Chronic, stable.  To bilateral lower extremity.  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  Continue ASA daily and monitor closely for any wounds.  Return to vascular as needed. 

## 2022-07-03 NOTE — Assessment & Plan Note (Signed)
Chronic, ongoing.  Followed by cardiology at this time.  Continue current medication regimen and collaboration, appreciate their input.  HR regular today with stable rate on exam, on bradycardic side per baseline.

## 2022-07-03 NOTE — Progress Notes (Signed)
BP 138/72   Pulse (!) 58   Temp 97.8 F (36.6 C) (Oral)   Ht 6' 0.99" (1.854 m)   Wt 184 lb (83.5 kg)   SpO2 97%   BMI 24.28 kg/m    Subjective:    Patient ID: Sean Flax., male    DOB: 07-26-1940, 82 y.o.   MRN: [id number]  HPI: Sean Macvicar. is a 82 y.o. male  Chief Complaint  Patient presents with   Diabetes   Hypertension   Hyperlipidemia   Chronic Kidney Disease   Gastroesophageal Reflux   Benign Prostatic Hypertrophy   Pain   DIABETES WITH NEUROPATHY A1c February 7%.  Continues Metformin 500 MG BID. Hypoglycemic episodes:no Polydipsia/polyuria: no Visual disturbance: no Chest pain: no Paresthesias: no Glucose Monitoring: yes  Accucheck frequency: 3 times a week  Fasting glucose: 132 yesterday -- average 130 range  Post prandial:  Evening:  Before meals: Taking Insulin?: no  Long acting insulin:  Short acting insulin: Blood Pressure Monitoring: daily Retinal Examination: Up To Date -- Dr. Beverely Pace in [place] Foot Exam: Up to Date Pneumovax: Up to Date Influenza: Up to Date Aspirin: no    HYPERTENSION / HYPERLIPIDEMIA Saw cardiology on 03/23/22, reduction in Toprol to 12.5 MG daily which offered benefit.  Taking Metoprolol XL, Imdur, Amlodipine, and Benazepril. Taking Rosuvastatin 5 MG for HLD.  Last saw pulmonary on 11/28/21 --  does not use inhalers.  History of elevation in TSH on past labs which we monitor.  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  Continues CPAP 100% of the time. Satisfied with current treatment? yes Duration of hypertension: chronic BP monitoring frequency: BID BP range: 120-130/70 average BP medication side effects: no Duration of hyperlipidemia: chronic Cholesterol medication side effects: no Cholesterol supplements: none Medication compliance: good compliance Aspirin: no Recent stressors: no Recurrent headaches: no Visual changes: no Palpitations: no Dyspnea: occasional during heavier activity Chest  pain: no Lower extremity edema: no Dizzy/lightheaded: no  ATRIAL FIBRILLATION Followed by cardiology as above.  Had ER visit on 05/26/22 for chest pain, this was determined to be epigastric pain and he was sent for EGD on 06/01/22. Atrial fibrillation status: stable Satisfied with current treatment: yes  Medication side effects:  no Medication compliance: good compliance Etiology of atrial fibrillation: unknown Palpitations:  no Chest pain:  no Dyspnea on exertion:  no Orthopnea:  no Syncope:  no Edema:  no Ventricular rate control: B-blocker Anti-coagulation: long acting   CHRONIC KIDNEY DISEASE Continues Tamsulosin, Proscar, and Vesicare per urology.  They were last seen 04/19/22, to return in one year. CKD status: stable Medications renally dose: yes Previous renal evaluation: no Pneumovax:  Up to Date Influenza Vaccine:  Up to Date   CHRONIC PAIN  Has pain stimulator -- they recently adjusted levels on 06/20/2022 with neurosurgery.  Ongoing neuropathy issues at baseline.  Taking Vitamin B12 and Vitamin D. Pain control status: stable Duration: years Location: back Quality: dull, aching, and throbbing Current Pain Level: moderate Previous Pain Level: moderate Breakthrough pain: no Benefit from narcotic medications:  not checking What Activities task can be accomplished with current medication? yes Interested in weaning off narcotics:no   Stool softners/OTC fiber: no  Previous pain specialty evaluation: yes Non-narcotic analgesic meds: no Narcotic contract: no  Relevant past medical, surgical, family and social history reviewed and updated as indicated. Interim medical history since our last visit reviewed. Allergies and medications reviewed and updated.  Review of Systems  Constitutional:  Negative for activity change, appetite change, diaphoresis, fatigue and fever.  Respiratory:  Negative for cough, chest tightness, shortness of breath and wheezing.   Cardiovascular:   Negative for chest pain, palpitations and leg swelling.  Gastrointestinal: Negative.   Endocrine: Negative for cold intolerance, heat intolerance, polydipsia, polyphagia and polyuria.  Neurological: Negative.   Psychiatric/Behavioral: Negative.     Per HPI unless specifically indicated above     Objective:    BP 138/72   Pulse (!) 58   Temp 97.8 F (36.6 C) (Oral)   Ht 6' 0.99" (1.854 m)   Wt 184 lb (83.5 kg)   SpO2 97%   BMI 24.28 kg/m   Wt Readings from Last 3 Encounters:  07/03/22 184 lb (83.5 kg)  06/20/22 184 lb (83.5 kg)  06/01/22 187 lb (84.8 kg)    Physical Exam Vitals and nursing note reviewed.  Constitutional:      General: He is awake. He is not in acute distress.    Appearance: He is well-developed and well-groomed. He is not ill-appearing.  HENT:     Head: Normocephalic and atraumatic.     Right Ear: Hearing normal. No drainage.     Left Ear: Hearing normal. No drainage.  Eyes:     General: Lids are normal.        Right eye: No discharge.        Left eye: No discharge.     Conjunctiva/sclera: Conjunctivae normal.     Pupils: Pupils are equal, round, and reactive to light.  Neck:     Thyroid: No thyromegaly.     Vascular: No carotid bruit.     Trachea: Trachea normal.  Cardiovascular:     Rate and Rhythm: Regular rhythm. Bradycardia present.     Heart sounds: Normal heart sounds, S1 normal and S2 normal. No murmur heard.    No gallop.  Pulmonary:     Effort: Pulmonary effort is normal. No accessory muscle usage or respiratory distress.     Breath sounds: Normal breath sounds.  Abdominal:     General: Bowel sounds are normal.     Palpations: Abdomen is soft.  Musculoskeletal:        General: Normal range of motion.     Cervical back: Normal range of motion and neck supple.     Right lower leg: No edema.     Left lower leg: No edema.  Lymphadenopathy:     Cervical: No cervical adenopathy.  Skin:    General: Skin is warm and dry.     Capillary  Refill: Capillary refill takes less than 2 seconds.  Neurological:     Mental Status: He is alert and oriented to person, place, and time.     Deep Tendon Reflexes:     Reflex Scores:      Patellar reflexes are 1+ on the right side and 1+ on the left side.      Achilles reflexes are 1+ on the right side and 1+ on the left side.    Comments: Antalgic gait, using cane.  Psychiatric:        Attention and Perception: Attention normal.        Mood and Affect: Mood normal.        Speech: Speech normal.        Behavior: Behavior normal. Behavior is cooperative.        Thought Content: Thought content normal.    Results for orders placed or performed in visit on 07/03/22  Bayer DCA Hb A1c Waived  Result Value Ref Range   HB A1C (BAYER DCA - WAIVED) 7.0 (H) 4.8 - 5.6 %      Assessment & Plan:   Problem List Items Addressed This Visit       Cardiovascular and Mediastinum   AF (paroxysmal atrial fibrillation) (HCC)    Chronic, ongoing.  Followed by cardiology at this time.  Continue current medication regimen and collaboration, appreciate their input.  HR regular today with stable rate on exam, on bradycardic side per baseline.      Coronary artery disease    Ongoing, continue collaboration with cardiology and current medication regimen.  Recent notes reviewed.        Hypertension associated with diabetes (HCC)    Chronic, stable.  BP at goal in office today.  A1c 7% today, remaining stable and urine ALB 05 April 2022.  Continue Metformin and current cardiac medications as ordered.  Will continue collaboration with cardiology, appreciate their input, recent notes reviewed.  Recommend he continue to check BP at home at least 3 mornings a week and document.  DASH diet.  LABS: A1c and BMP.       Relevant Orders   Bayer DCA Hb A1c Waived (Completed)   Basic metabolic panel   Peripheral vascular disease (HCC)    Chronic, stable.  To bilateral lower extremity.  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   Continue ASA daily and monitor closely for any wounds.  Return to vascular as needed.        Respiratory   COPD (chronic obstructive pulmonary disease) (HCC)    Chronic, stable.  Followed by pulmonary at this time, continue this collaboration.  He is not using inhalers.  Reports some improvement in SOB with change in cardiac medications.      OSA (obstructive sleep apnea)    Chronic, ongoing -- continue 100% use of CPAP.        Endocrine   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HCC)    Chronic, ongoing with A1c 7% today and urine ALB 21 (February 2024).  Significant decreased sensation bilateral feet, monitor closely for wounds and falls.  Continue current diabetes medication regimen and adjust as needed. He did not get benefit from Lyrica or Gabapentin, will continue collaboration with neurology and pain clinic as needed.  Continue to monitor BS daily and document for visits.      Relevant Orders   Bayer DCA Hb A1c Waived (Completed)   Basic metabolic panel   Diabetes mellitus with proteinuria (HCC) - Primary    Chronic, ongoing with A1c 7% today and urine ALB 21 (February 2024).  Continues to have stable BS control.  Will continue current medication regimen and adjust as needed.  Recommend he check BS at least 3 times daily.  Continue Benazepril for kidney protection. - Vaccinations up to date - ACE and Statin on board - Foot and eye exam up to date.      Relevant Orders   Bayer DCA Hb A1c Waived (Completed)   Basic metabolic panel   Hyperlipidemia associated with type 2 diabetes mellitus (HCC)    Chronic, ongoing.  Continue current medication regimen, tolerating 5 MG Crestor well without ADR, and adjust as needed.  Lipid panel up to date.      Relevant Orders   Bayer DCA Hb A1c Waived (Completed)     Genitourinary   CKD (chronic kidney disease) stage 3, GFR 30-59 ml/min (HCC)    Chronic, ongoing.  Continue to  monitor closely and refer to nephrology if decline.   Continue Benazepril for kidney protection and proteinuria.  Renal dose medications as needed based on labs.        Relevant Orders   Basic metabolic panel     Hematopoietic and Hemostatic   Acquired thrombophilia (HCC)    Patient on Eliquis with A-fib, monitor CBC regularly.        Other   Chronic pain syndrome    Chronic, ongoing.  Followed by neurosurgery and pain management as needed.  Continue this collaboration and current regimen. Lidocaine patches present at home.      Elevated TSH    Waxes and wanes -- recheck today and initiate medication as needed.      Relevant Orders   T4, free   TSH   Other Visit Diagnoses     Hypothyroidism, unspecified type       refer to elevated TSH   Relevant Orders   T4, free   TSH        Follow up plan: Return in about 3 months (around 10/03/2022) for T2DM, HTN/HLD, CKD, CHRONIC PAIN, A-FIB.

## 2022-07-03 NOTE — Assessment & Plan Note (Addendum)
Chronic, ongoing.  Followed by neurosurgery and pain management as needed.  Continue this collaboration and current regimen. Lidocaine patches present at home.

## 2022-07-03 NOTE — Assessment & Plan Note (Signed)
Chronic, ongoing.  Continue current medication regimen, tolerating 5 MG Crestor well without ADR, and adjust as needed.  Lipid panel up to date. 

## 2022-07-03 NOTE — Assessment & Plan Note (Signed)
Chronic, stable.  BP at goal in office today.  A1c 7% today, remaining stable and urine ALB 05 April 2022.  Continue Metformin and current cardiac medications as ordered.  Will continue collaboration with cardiology, appreciate their input, recent notes reviewed.  Recommend he continue to check BP at home at least 3 mornings a week and document.  DASH diet.  LABS: A1c and BMP.

## 2022-07-03 NOTE — Assessment & Plan Note (Addendum)
Chronic, ongoing with A1c 7% today and urine ALB 21 (February 2024).  Significant decreased sensation bilateral feet, monitor closely for wounds and falls.  Continue current diabetes medication regimen and adjust as needed. He did not get benefit from Lyrica or Gabapentin, will continue collaboration with neurology and pain clinic as needed.  Continue to monitor BS daily and document for visits.

## 2022-07-03 NOTE — Assessment & Plan Note (Signed)
Chronic, ongoing.  Continue to monitor closely and refer to nephrology if decline.  Continue Benazepril for kidney protection and proteinuria.  Renal dose medications as needed based on labs.   

## 2022-07-03 NOTE — Assessment & Plan Note (Signed)
Ongoing, continue collaboration with cardiology and current medication regimen.  Recent notes reviewed.   

## 2022-07-04 LAB — BASIC METABOLIC PANEL
BUN/Creatinine Ratio: 16 (ref 10–24)
BUN: 20 mg/dL (ref 8–27)
CO2: 23 mmol/L (ref 20–29)
Calcium: 9.6 mg/dL (ref 8.6–10.2)
Chloride: 96 mmol/L (ref 96–106)
Creatinine, Ser: 1.23 mg/dL (ref 0.76–1.27)
Glucose: 230 mg/dL — ABNORMAL HIGH (ref 70–99)
Potassium: 4.3 mmol/L (ref 3.5–5.2)
Sodium: 134 mmol/L (ref 134–144)
eGFR: 60 mL/min/{1.73_m2} (ref >59)

## 2022-07-04 LAB — T4, FREE: Free T4: 1.39 ng/dL (ref 0.82–1.77)

## 2022-07-04 LAB — TSH: TSH: 3.66 u[IU]/mL (ref 0.450–4.500)

## 2022-07-04 NOTE — Progress Notes (Signed)
Contacted via MyChart   Good afternoon Chaise, your labs have returned: - Kidney function, creatinine and eGFR, normal this check.  Glucose a bit elevated, but we discussed A1c yesterday.  Continue current medications and we will recheck next visit. - Thyroid levels are normal.  Any questions? Keep being amazing!!  Thank you for allowing me to participate in your care.  I appreciate you. Kindest regards, Naomee Nowland

## 2022-07-11 ENCOUNTER — Telehealth: Payer: Medicare Other

## 2022-07-11 ENCOUNTER — Ambulatory Visit (INDEPENDENT_AMBULATORY_CARE_PROVIDER_SITE_OTHER): Payer: Medicare Other

## 2022-07-11 DIAGNOSIS — N1831 Chronic kidney disease, stage 3a: Secondary | ICD-10-CM

## 2022-07-11 DIAGNOSIS — E1143 Type 2 diabetes mellitus with diabetic autonomic (poly)neuropathy: Secondary | ICD-10-CM

## 2022-07-11 DIAGNOSIS — E1159 Type 2 diabetes mellitus with other circulatory complications: Secondary | ICD-10-CM

## 2022-07-11 DIAGNOSIS — G894 Chronic pain syndrome: Secondary | ICD-10-CM

## 2022-07-11 NOTE — Patient Instructions (Signed)
Please call the care guide team at [phone] if you need to cancel or reschedule your appointment.   If you are experiencing a Mental Health or Behavioral Health Crisis or need someone to talk to, please call the Suicide and Crisis Lifeline: 988 call the [place] National Suicide Prevention Lifeline: (229)614-0988 or TTY: 571-155-9414 TTY (231)082-1107) to talk to a trained counselor call 1-800-273-TALK (toll free, 24 hour hotline)   Following is a copy of the CCM Program Consent:  CCM service includes personalized support from designated clinical staff supervised by the physician, including individualized plan of care and coordination with other care providers 24/7 contact phone numbers for assistance for urgent and routine care needs. Service will only be billed when office clinical staff spend 20 minutes or more in a month to coordinate care. Only one practitioner may furnish and bill the service in a calendar month. The patient may stop CCM services at amy time (effective at the end of the month) by phone call to the office staff. 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 The patient states his blood sugars are doing well but he is having a lot of  weakness in his legs along with pain due to neuropathy. The patient states that he has started to go to the gym 3 times a week with his wife to see if he can build up some muscle and strength in his legs. He easily gets tired when ambulating and sometime more short of breath. He is pacing his activity. He saw the specialist about his back pain and weakness. They could not find any acute changes but the patient says he is still having a time with back pain and discomfort. They added 5 more programs to his stimulator and he is trying to not over do it. He states that he has to pace his activity and when he overdoes it he pays for it with more pain. Reflective listening and support given  Reviewed medications with patient and discussed importance of medication adherence. The patient is compliant with medications;        Reviewed prescribed diet with patient heart healthy/ADA diet. Review of heart healthy/ADA diet. The patient says his swallowing may be a little better but not a lot. 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 The patient has regular lab work. Reviewed with the patient that his A1C is up from 6.5 to 7.0. Education on monitoring for acute changes and calling the pcp for recommendations.;        Discussed plans with patient  for ongoing care management follow up and provided patient with direct contact information for care management team. Is appreciative of the CCM team calling and checking on him and helping him with his health and well being;      Provided patient with written educational materials related to hypo and hyperglycemia and importance of correct treatment;       Reviewed scheduled/upcoming provider appointments including: 10-03-2022 at 1120 am;         Advised patient, providing education and rationale, to check cbg twice daily and when you have  symptoms of low or high blood sugar and record        call provider for findings outside established parameters;       Review of patient status, including review of consultants reports, relevant laboratory and other test results, and medications completed;       Advised patient to discuss changes in her DM health and well being with provider;      Screening for signs and symptoms of depression related to chronic disease state;        Assessed social determinant of health barriers;         Symptom Management: Take medications as prescribed   Attend all scheduled provider appointments Call provider office for new concerns or questions  call the Suicide and Crisis Lifeline: 988 call the [place] National Suicide Prevention Lifeline: 718-028-4735 or TTY: (437)330-5985 TTY 217-401-7391) to talk to a trained counselor call 1-800-273-TALK (toll free, 24 hour hotline) if experiencing a Mental Health or Behavioral Health Crisis  check feet daily for cuts, sores or redness trim toenails straight across manage portion size wash and dry feet carefully every day wear comfortable, cotton socks wear comfortable, well-fitting shoes  Follow Up Plan: Telephone follow up appointment with care management team member scheduled for: 09-04-2022 at 230 pm       CCM Expected Outcome:  Monitor, Self-Manage and Reduce Symptoms of: CKD       Current Barriers:  Chronic Disease Management support and education needs related to effective management of CKD Lab Results  Component Value Date   CREATININE 1.23 07/03/2022   CREATININE 1.18 05/25/2022   CREATININE 1.37 (H) 04/12/2022    Lab Results  Component Value Date   CREATININE 1.23 07/03/2022   BUN 20 07/03/2022   NA 134 07/03/2022   K 4.3 07/03/2022   CL 96 07/03/2022   CO2 23 07/03/2022     Planned Interventions: Assessed the patient understanding of chronic kidney disease. The patient had labs. Reviewed labs with the patient. Denies any acute changes in  his CKD or kidney function. States that his kidney function is good.    Evaluation of current treatment plan related to chronic kidney disease self management and patient's adherence to plan as established by provider.       Provided education to patient re: stroke prevention, s/s of heart attack and stroke    Reviewed prescribed diet heart healthy/ADA diet  Reviewed medications with patient and discussed importance of compliance. The patient is compliant with medications    Advised patient, providing education and rationale, to monitor blood pressure daily and record, calling PCP for findings outside established parameters    Discussed complications of poorly controlled blood pressure such as heart disease, stroke, circulatory complications, vision complications, kidney impairment, sexual dysfunction    Reviewed scheduled/upcoming provider appointments including: 10-03-2022 at 1120 am  Advised patient to discuss changes in his kidney function, or questions and concerns related to CKD  with provider    Discussed plans with patient for ongoing care management follow up and provided patient with direct contact information for care management team    Screening for signs and symptoms of depression related to chronic disease state      Discussed the impact of chronic kidney disease on daily life and mental health and acknowledged and normalized feelings of disempowerment, fear, and frustration    Assessed social determinant of health barriers    Provided education on kidney disease progression    Engage patient in early, proactive and ongoing discussion about goals of care and what matters most to them    Support coping and stress management by recognizing current strategies and assist in developing new strategies such as mindfulness, journaling, relaxation techniques, problem-solving. The patient is still dealing with ongoing chronic back pain and leg weakness. Sees provider on a regular basis. Denies  falls. The patient had a great time at the beach.  The patient has seen the specialist and there is no acute changes in his back structure. Reflective listening and support given.  Symptom Management: Take medications as prescribed   Attend all scheduled provider appointments Call provider office for new concerns or questions  call the Suicide and Crisis Lifeline: 988 call the [place] National Suicide Prevention Lifeline: (539) 683-4422 or TTY: 214-239-2406 TTY 316-049-2125) to talk to a trained counselor call 1-800-273-TALK (toll free, 24 hour hotline) if experiencing a Mental Health or Behavioral Health Crisis   Follow Up Plan: Telephone follow up appointment with care management team member scheduled for: 09-04-2022 at 230 pm       CCM Expected Outcome:  Monitor, Self-Manage, and Reduce Symptoms of Hypertension       Current Barriers:  Knowledge Deficits related to fluctuations in blood pressures and the need to have normalized blood pressures to prevent the risk of heart attack and stroke Chronic Disease Management support and education needs related to effective management of HTN BP Readings from Last 3 Encounters:  07/03/22 138/72  06/20/22 (!) 140/78  06/01/22 (!) 152/87     Planned Interventions: Evaluation of current treatment plan related to hypertension self management and patient's adherence to plan as established by provider. The patient with some elevated blood pressures. Feels like it is related to his back pain and stress. The patient is having more stable blood pressures at this time. Today was an okay day. The patient denies any acute changes in his back pain or discomfort.  Provided education to patient re: stroke prevention, s/s of heart attack and stroke; Reviewed prescribed diet heart healthy/ADA diet. The patient is compliant with dietary restrictions. The patient has decided to stay away from [employer] sal[employer].  Reviewed medications with patient and discussed importance of  compliance. The patient states compliance with medications.  Discussed plans with patient for ongoing care management follow up and provided patient with direct contact information for care management team; Advised patient, providing education and rationale, to monitor blood pressure daily and record, calling PCP for findings outside established parameters.   Reviewed scheduled/upcoming provider appointments including:  next pcp appointment on 10-03-2022 at 1120 am Advised patient to discuss changes in his HTN, or heart health  with provider; Provided education on prescribed diet Heart healthy/ADA diet ;  Discussed complications of poorly controlled blood pressure such as heart disease, stroke, circulatory complications, vision complications, kidney impairment, sexual dysfunction;  Screening for signs and symptoms of depression related to chronic disease state;  Assessed social determinant of health barriers;  Symptom Management: Take medications as prescribed   Attend all scheduled provider appointments Call provider office for new concerns or questions  call the Suicide and Crisis Lifeline: 988 call the [place] National Suicide Prevention Lifeline: 9187355690 or TTY: 904-636-0971 TTY 760 460 5088) to talk to a trained counselor call 1-800-273-TALK (toll free, 24 hour hotline) if experiencing a Mental Health or Behavioral Health Crisis  check blood pressure 3 times per week learn about high blood pressure keep a blood pressure log take blood pressure log to all doctor appointments call doctor for signs and symptoms of high blood pressure develop an action plan for high blood pressure keep all doctor appointments take medications for blood pressure exactly as prescribed report new symptoms to your doctor  Follow Up Plan: Telephone follow up appointment with care management team member scheduled for: 09-04-2022 at 230 pm          Patient verbalizes understanding of instructions and  care plan provided today and agrees to view in MyChart. Active MyChart status and patient understanding of how to access instructions and care plan via MyChart confirmed with patient.  Telephone follow up appointment with care management team member scheduled for: 09-04-2022 at 230 pm

## 2022-07-11 NOTE — Chronic Care Management (AMB) (Signed)
Chronic Care Management   CCM RN Visit Note  07/11/2022 Name: Sean Reyes. MRN: [id number] DOB: [birth year]  Subjective: Sean Reyes. is a 82 y.o. year old male who is a primary care patient of [name], Dorie Rank, NP. The patient was referred to the Chronic Care Management team for assistance with care management needs subsequent to provider initiation of CCM services and plan of care.    Today's Visit:  Engaged with patient by telephone for follow up visit.        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 The patient states that he has started to go to the gym 3 times a week with his wife to see if he can build up some muscle and strength in his legs. He easily gets tired when ambulating and sometime more short of breath. He is pacing his activity. He saw the specialist about his back pain and weakness. They could not find any acute changes but the patient says he is still having a time with back pain and discomfort. They added 5 more programs to his stimulator and he is trying to not over do it. He states that he has to pace his activity and when he overdoes it he pays for it with more pain. Reflective listening and support given  Reviewed medications with patient and discussed importance  of medication adherence. The patient is compliant with medications;        Reviewed prescribed diet with patient heart healthy/ADA diet. Review of heart healthy/ADA diet. The patient says his swallowing may be a little better but not a lot. 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 The patient has regular lab work. Reviewed with the patient that his A1C is up from 6.5 to 7.0. Education on monitoring for acute changes and calling the pcp for recommendations.;        Discussed plans with patient for ongoing care management follow up and provided patient with direct contact information for care management team. Is appreciative of the CCM team calling and checking on him and helping him with his health and well being;      Provided patient with written educational materials related to hypo and hyperglycemia and importance of correct treatment;       Reviewed scheduled/upcoming provider appointments including: 10-03-2022 at 1120 am;         Advised patient, providing education and rationale, to check cbg twice daily and when you have symptoms of low or high blood sugar and record        call provider for findings outside established  parameters;       Review of patient status, including review of consultants reports, relevant laboratory and other test results, and medications completed;       Advised patient to discuss changes in her DM health and well being with provider;      Screening for signs and symptoms of depression related to chronic disease state;        Assessed social determinant of health barriers;         Symptom Management: Take medications as prescribed   Attend all scheduled provider appointments Call provider office for new concerns or questions  call the Suicide and Crisis Lifeline: 988 call the [place] National Suicide Prevention Lifeline:  321-422-6419 or TTY: 4327551278 TTY (570) 024-1094) to talk to a trained counselor call 1-800-273-TALK (toll free, 24 hour hotline) if experiencing a Mental Health or Behavioral Health Crisis  check feet daily for cuts, sores or redness trim toenails straight across manage portion size wash and dry feet carefully every day wear comfortable, cotton socks wear comfortable, well-fitting shoes  Follow Up Plan: Telephone follow up appointment with care management team member scheduled for: 09-04-2022 at 230 pm       CCM Expected Outcome:  Monitor, Self-Manage and Reduce Symptoms of: CKD       Current Barriers:  Chronic Disease Management support and education needs related to effective management of CKD Lab Results  Component Value Date   CREATININE 1.23 07/03/2022   CREATININE 1.18 05/25/2022   CREATININE 1.37 (H) 04/12/2022    Lab Results  Component Value Date   CREATININE 1.23 07/03/2022   BUN 20 07/03/2022   NA 134 07/03/2022   K 4.3 07/03/2022   CL 96 07/03/2022   CO2 23 07/03/2022     Planned Interventions: Assessed the patient understanding of chronic kidney disease. The patient had labs. Reviewed labs with the patient. Denies any acute changes in his CKD or kidney function. States that his kidney function is good.    Evaluation of current treatment plan related to chronic kidney disease self management and patient's adherence to plan as established by provider.       Provided education to patient re: stroke prevention, s/s of heart attack and stroke    Reviewed prescribed diet heart healthy/ADA diet  Reviewed medications with patient and discussed importance of compliance. The patient is compliant with medications    Advised patient, providing education and rationale, to monitor blood pressure daily and record, calling PCP for findings outside established parameters    Discussed complications of poorly controlled blood pressure such as heart disease, stroke, circulatory  complications, vision complications, kidney impairment, sexual dysfunction    Reviewed scheduled/upcoming provider appointments including: 10-03-2022 at 1120 am  Advised patient to discuss changes in his kidney function, or questions and concerns related to CKD with provider    Discussed plans with patient for ongoing care management follow up and provided patient with direct contact information for care management team    Screening for signs and symptoms of depression related to chronic disease state      Discussed the impact of chronic kidney disease on daily life and mental health and acknowledged and normalized feelings of disempowerment, fear, and frustration    Assessed social determinant of health barriers    Provided education on kidney disease progression    Engage patient in early, proactive and ongoing discussion about goals of care and what matters most to them    Support coping and stress management by recognizing  current strategies and assist in developing new strategies such as mindfulness, journaling, relaxation techniques, problem-solving. The patient is still dealing with ongoing chronic back pain and leg weakness. Sees provider on a regular basis. Denies falls. The patient had a great time at the beach.  The patient has seen the specialist and there is no acute changes in his back structure. Reflective listening and support given.  Symptom Management: Take medications as prescribed   Attend all scheduled provider appointments Call provider office for new concerns or questions  call the Suicide and Crisis Lifeline: 988 call the [place] National Suicide Prevention Lifeline: 334-815-6566 or TTY: 4846393329 TTY (603)736-9938) to talk to a trained counselor call 1-800-273-TALK (toll free, 24 hour hotline) if experiencing a Mental Health or Behavioral Health Crisis   Follow Up Plan: Telephone follow up appointment with care management team member scheduled for: 09-04-2022 at 230 pm        CCM Expected Outcome:  Monitor, Self-Manage, and Reduce Symptoms of Hypertension       Current Barriers:  Knowledge Deficits related to fluctuations in blood pressures and the need to have normalized blood pressures to prevent the risk of heart attack and stroke Chronic Disease Management support and education needs related to effective management of HTN BP Readings from Last 3 Encounters:  07/03/22 138/72  06/20/22 (!) 140/78  06/01/22 (!) 152/87     Planned Interventions: Evaluation of current treatment plan related to hypertension self management and patient's adherence to plan as established by provider. The patient with some elevated blood pressures. Feels like it is related to his back pain and stress. The patient is having more stable blood pressures at this time. Today was an okay day. The patient denies any acute changes in his back pain or discomfort.  Provided education to patient re: stroke prevention, s/s of heart attack and stroke; Reviewed prescribed diet heart healthy/ADA diet. The patient is compliant with dietary restrictions. The patient has decided to stay away from [employer] sal[employer].  Reviewed medications with patient and discussed importance of compliance. The patient states compliance with medications.  Discussed plans with patient for ongoing care management follow up and provided patient with direct contact information for care management team; Advised patient, providing education and rationale, to monitor blood pressure daily and record, calling PCP for findings outside established parameters.   Reviewed scheduled/upcoming provider appointments including:  next pcp appointment on 10-03-2022 at 1120 am Advised patient to discuss changes in his HTN, or heart health  with provider; Provided education on prescribed diet Heart healthy/ADA diet ;  Discussed complications of poorly controlled blood pressure such as heart disease, stroke, circulatory complications, vision  complications, kidney impairment, sexual dysfunction;  Screening for signs and symptoms of depression related to chronic disease state;  Assessed social determinant of health barriers;   Symptom Management: Take medications as prescribed   Attend all scheduled provider appointments Call provider office for new concerns or questions  call the Suicide and Crisis Lifeline: 988 call the [place] National Suicide Prevention Lifeline: 5810871592 or TTY: 845 275 2499 TTY (774) 116-2714) to talk to a trained counselor call 1-800-273-TALK (toll free, 24 hour hotline) if experiencing a Mental Health or Behavioral Health Crisis  check blood pressure 3 times per week learn about high blood pressure keep a blood pressure log take blood pressure log to all doctor appointments call doctor for signs and symptoms of high blood pressure develop an action plan for high blood pressure keep all doctor appointments take medications for blood pressure  exactly as prescribed report new symptoms to your doctor  Follow Up Plan: Telephone follow up appointment with care management team member scheduled for: 09-04-2022 at 230 pm          Plan:Telephone follow up appointment with care management team member scheduled for:  09-04-2022 at 230 pm  Alto Denver RN, MSN, CCM RN Care Manager  Chronic Care Management Direct Number: [phone]

## 2022-07-12 NOTE — Telephone Encounter (Signed)
Pt called in today to see if you wanted to order additional imaging that pain has not gotten any better and referenced this mychart exchange

## 2022-07-12 NOTE — Addendum Note (Signed)
Addended byDrake Leach on: 07/12/2022 04:13 PM   Modules accepted: Orders

## 2022-07-12 NOTE — Telephone Encounter (Signed)
I recommend he contact the spinal cord stimulator rep to see if the can do any remote reprogramming prior to getting any other imaging. Has he done this?

## 2022-07-14 DIAGNOSIS — N1831 Chronic kidney disease, stage 3a: Secondary | ICD-10-CM

## 2022-07-14 DIAGNOSIS — E1159 Type 2 diabetes mellitus with other circulatory complications: Secondary | ICD-10-CM

## 2022-07-14 DIAGNOSIS — I152 Hypertension secondary to endocrine disorders: Secondary | ICD-10-CM

## 2022-07-14 DIAGNOSIS — E1143 Type 2 diabetes mellitus with diabetic autonomic (poly)neuropathy: Secondary | ICD-10-CM

## 2022-07-20 ENCOUNTER — Ambulatory Visit
Admission: RE | Admit: 2022-07-20 | Discharge: 2022-07-20 | Disposition: A | Discharge status: Home / Self Care | Payer: Medicare Other | Source: Ambulatory Visit | Attending: Orthopedic Surgery | Admitting: Orthopedic Surgery

## 2022-07-20 DIAGNOSIS — Z981 Arthrodesis status: Secondary | ICD-10-CM | POA: Diagnosis not present

## 2022-07-20 DIAGNOSIS — M545 Low back pain, unspecified: Secondary | ICD-10-CM | POA: Diagnosis not present

## 2022-07-20 DIAGNOSIS — R29898 Other symptoms and signs involving the musculoskeletal system: Secondary | ICD-10-CM | POA: Diagnosis not present

## 2022-07-20 DIAGNOSIS — Z9689 Presence of other specified functional implants: Secondary | ICD-10-CM | POA: Diagnosis not present

## 2022-08-03 ENCOUNTER — Telehealth: Payer: Self-pay | Admitting: Orthopedic Surgery

## 2022-08-03 NOTE — Telephone Encounter (Signed)
Will contact him as soon as I have CT results to discuss further plan.

## 2022-08-03 NOTE — Telephone Encounter (Signed)
CT on 07/20/2022, calling for results. He is still having difficulty walking. In his chart there are no results. I told patient that the results are taking longer then normal to show up in the chart but we will call him as soon as we get them.

## 2022-08-07 ENCOUNTER — Encounter: Payer: Self-pay | Admitting: Orthopedic Surgery

## 2022-08-07 ENCOUNTER — Ambulatory Visit (INDEPENDENT_AMBULATORY_CARE_PROVIDER_SITE_OTHER): Payer: Medicare Other | Admitting: Orthopedic Surgery

## 2022-08-07 ENCOUNTER — Telehealth: Payer: Self-pay

## 2022-08-07 DIAGNOSIS — M47816 Spondylosis without myelopathy or radiculopathy, lumbar region: Secondary | ICD-10-CM

## 2022-08-07 DIAGNOSIS — Z981 Arthrodesis status: Secondary | ICD-10-CM

## 2022-08-07 DIAGNOSIS — M5136 Other intervertebral disc degeneration, lumbar region: Secondary | ICD-10-CM | POA: Diagnosis not present

## 2022-08-07 DIAGNOSIS — Z9689 Presence of other specified functional implants: Secondary | ICD-10-CM | POA: Diagnosis not present

## 2022-08-07 NOTE — Progress Notes (Signed)
Telephone Visit- Progress Note: Referring Physician:  No referring provider defined for this encounter.  Primary Physician:  Marjie Skiff, NP  This visit was performed via telephone.  Patient location: home Provider location: office  I spent a total of 10 minutes non-face-to-face activities for this visit on the date of this encounter including review of current clinical condition and response to treatment.    Patient has given verbal consent to this telephone visits and we reviewed the limitations of a telephone visit. Patient wishes to proceed.    Chief Complaint:  f/u CT scan results  History of Present Illness: Sean Reyes. is a 82 y.o. male has a history of  history of chronic anemia, MI, ablation for afib, CKD 3, DM, HTN, hyperlipidemia, OSA, and history of c.diff.    History of multiple lumbar surgeries with persistent pain. Further surgery was not recommended by Dr. Myer Haff and it was recommended that he follow up with Dr. Cherylann Ratel.    He had SCS in 2021 with Dr. Claudette Laws and he does not feel like it's helping as much as it was previously.    He continues with more constant LBP that is severe with standing or walking. He can't walk or stand more than 2 minutes without sitting down. No leg pain, but his legs feel weak when standing/walking. He has known COPD and has SOB with walking. He has known neuropathy as well.   Exam: No exam done as this was a telephone encounter.     Imaging: CT of lumbar spine dated 07/20/22:  Narrative & Impression  CLINICAL DATA:  Low back pain. Prior surgery. New symptoms. Severe bilateral leg weakness.   EXAM: CT LUMBAR SPINE WITHOUT CONTRAST   TECHNIQUE: Multidetector CT imaging of the lumbar spine was performed without intravenous contrast administration. Multiplanar CT image reconstructions were also generated.   RADIATION DOSE REDUCTION: This exam was performed according to the departmental dose-optimization program which  includes automated exposure control, adjustment of the mA and/or kV according to patient size and/or use of iterative reconstruction technique.   COMPARISON:  Or radiography 06/20/2022. MRI 06/23/2021. CT 06/23/2021.   FINDINGS: Segmentation: 5 lumbar type vertebral bodies as numbered previously.   Alignment: No malalignment, except for 1-2 mm of degenerative retrolisthesis at T11-12 as seen previously.   Vertebrae: Solid fusion across the T12-L1 disc space. Large anterior osteophytes at L1-2 but possibly without definite solid bridging. This may remain a mobile level. Solid union postsurgical from L2 to the sacrum.   Paraspinal and other soft tissues: No acute finding. Aortic atherosclerosis.   Disc levels: T11 12: 2 mm of retrolisthesis. Disc degeneration with vacuum phenomenon. Neurostimulator in place within the dorsal spinal canal. Mild bulging of the disc. Mild canal narrowing but no likely neural compression.   T12-L1: Solid bridging anterior osteophytes. Wide patency of the canal and foramina. Neurostimulator enters at this level.   L1-2: Prominent anterior osteophytes but probably without solid bridging. Bulging of the disc. Mild facet and ligamentous hypertrophy. Mild narrowing of the lateral recesses but no likely neural compression.   L2 to sacrum: Previous discectomy and fusion procedures. Solid union throughout the region. Sufficient patency of the central canal. Some chronic bony foraminal narrowing on the right at L4-5 and L5-S1, not likely compressive.   Bilateral sacroiliac osteoarthritis is noted.   IMPRESSION: 1. No acute finding by CT. Very similar appearance to the prior CT study of 06/23/2021. 2. Solid fusion from L2 to the sacrum. Sufficient patency  of the canal and foramina. 3. T11-12: 2 mm of retrolisthesis. Disc degeneration with vacuum phenomenon. Mild bulging of the disc. Mild canal narrowing but no likely neural compression. Findings could  relate to regional pain. 4. L1-2: Prominent anterior osteophytes but probably without solid bridging. Bulging of the disc. Mild facet and ligamentous hypertrophy. Mild narrowing of the lateral recesses but no likely neural compression. 5. Bilateral sacroiliac osteoarthritis. 6. Aortic atherosclerosis.   Aortic Atherosclerosis (ICD10-I70.0).     Electronically Signed   By: Paulina Fusi M.D.   On: 08/07/2022 11:38   I have personally reviewed the images and agree with the above interpretation.  Assessment and Plan: Sean Reyes is a pleasant 82 y.o. male with history of multiple lumbar surgeries with persistent pain.    He had SCS in 2021 with Dr. Claudette Laws and he does not feel like it's helping as much as it was previously.    He continues with more constant LBP that is severe with standing or walking. He can't walk or stand more than 2 minutes without sitting down. No leg pain, but his legs feel weak when standing/walking. He has known neuropathy as well.   CT shows that he is fused L2-S1, fused across disc at T12-L1, may be fused L1-L2, and he has vacuum disc at T11-T12.   Treatment options discussed with patient and following plan made:   - Likely no further surgery is recommended, but will review with Dr. Myer Haff.  - May consider referral back to pain management (Dr. Cherylann Ratel).  - He's had SCS reprogrammed and he states they could not help him with his neuropathy.  - Once I review with Dr. Myer Haff, I will let him know.   Drake Leach PA-C Neurosurgery

## 2022-08-07 NOTE — Telephone Encounter (Signed)
Pt called in again today f/u on results from his CT 06/06. I called the imaging dept and spoke with Romeo Apple who said he would look into this and try to have it bumped up to be read hopefully sometime today

## 2022-08-07 NOTE — Telephone Encounter (Signed)
Spoke to patient and verified that he sees Dr. Welton Flakes for OSA. He wishes to continue with her office for COPD management.

## 2022-08-08 ENCOUNTER — Ambulatory Visit (INDEPENDENT_AMBULATORY_CARE_PROVIDER_SITE_OTHER): Payer: Medicare Other | Admitting: Student in an Organized Health Care Education/Training Program

## 2022-08-08 ENCOUNTER — Encounter: Payer: Self-pay | Admitting: Student in an Organized Health Care Education/Training Program

## 2022-08-08 VITALS — BP 130/60 | HR 73 | Temp 97.6°F | Ht 73.0 in | Wt 184.8 lb

## 2022-08-08 DIAGNOSIS — J449 Chronic obstructive pulmonary disease, unspecified: Secondary | ICD-10-CM | POA: Diagnosis not present

## 2022-08-08 MED ORDER — ANORO ELLIPTA 62.5-25 MCG/ACT IN AEPB
1.0000 | INHALATION_SPRAY | Freq: Every day | RESPIRATORY_TRACT | 12 refills | Status: DC
Start: 2022-08-08 — End: 2022-09-11

## 2022-08-08 NOTE — Addendum Note (Signed)
Addended byDrake Leach on: 08/08/2022 01:56 PM   Modules accepted: Orders

## 2022-08-08 NOTE — Progress Notes (Signed)
Assessment & Plan:   #Shortness of breath on exertion #COPD GOLD 2B   Patient presenting with shortness of breath with PFT's performed prior to his last visit showing obstruction with a reduced FEV1/FVC ratio and mild drop in FEV1. 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 Bronchodilators during spirometry had helped with his symptoms but he was hesitant to start LABA/LAMA and did not initiate the Anoro. PFT's also show a drop in his TLC as well as DLCO - follow up chest CT performed did not show any signs of interstitial lung disease. His lung exam remains re-assuring with no rales, rhonchi, stridor, or wheeze.   Symptoms have recently worsened and Sean Reyes would like to trial bronchodilators and see if they would help with his symptoms. I have re-sent a prescription for Anoro Ellipta. Patient will call in a month to let us know if his symptoms have improved with the Anoro. He'll also let us know asap if he develops any side effects.  - umeclidinium-vilanterol (ANORO ELLIPTA) 62.5-25 MCG/ACT AEPB; Inhale 1 puff into the lungs daily.  Dispense: 30 each; Refill: 12   Return in about 1 year (around 08/08/2023).  I spent 30 minutes caring for this patient today, including preparing to see the patient, obtaining a medical history , reviewing a separately obtained history, performing a medically appropriate examination and/or evaluation, counseling and educating the patient/family/caregiver, ordering medications, tests, or procedures, and documenting clinical information in the electronic health record  Sean Chute, MD [facility] 08/08/2022 3:19 PM    End of visit medications:  Meds ordered this encounter  Medications   umeclidinium-vilanterol (ANORO ELLIPTA) 62.5-25 MCG/ACT AEPB    Sig: Inhale 1 puff into the lungs daily.    Dispense:  30 each    Refill:  12     Current Outpatient  Medications:    amLODipine (NORVASC) 5 MG tablet, Take 1 tablet (5 mg total) by mouth at bedtime., Disp: 90 tablet, Rfl: 3   benazepril (LOTENSIN) 20 MG tablet, Take 1 tablet (20 mg total) by mouth daily., Disp: 90 tablet, Rfl: 3   cyanocobalamin 1000 MCG tablet, Take by mouth., Disp: , Rfl:    ELIQUIS 5 MG TABS tablet, Take 1 tablet (5 mg total) by mouth 2 (two) times daily., Disp: 180 tablet, Rfl: 4   ferrous sulfate 325 (65 FE) MG EC tablet, Take 325 mg by mouth daily with breakfast., Disp: , Rfl:    finasteride (PROSCAR) 5 MG tablet, Take 1 tablet (5 mg total) by mouth daily., Disp: 90 tablet, Rfl: 3   isosorbide mononitrate (IMDUR) 30 MG 24 hr tablet, Take 30 mg by mouth daily., Disp: , Rfl:    lidocaine (LIDODERM) 5 %, Place 1 patch onto the skin daily. Remove & Discard patch within 12 hours or as directed by MD, Disp: 30 patch, Rfl: 2   metFORMIN (GLUCOPHAGE) 500 MG tablet, Take 1 tablet (500 mg total) by mouth 2 (two) times daily with a meal., Disp: 180 tablet, Rfl: 4   metoprolol succinate (TOPROL XL) 25 MG 24 hr tablet, Take 0.5 tablets (12.5 mg total) by mouth daily., Disp: 45 tablet, Rfl: 1   omeprazole (PRILOSEC) 40 MG capsule, Take 1 capsule (40 mg total) by mouth in the morning and at bedtime., Disp: 180 capsule, Rfl: 3   rosuvastatin (CRESTOR) 5 MG tablet, Take 1 tablet (5 mg total) by mouth daily., Disp: 90  tablet, Rfl: 4   solifenacin (VESICARE) 5 MG tablet, Take 1 tablet (5 mg total) by mouth daily., Disp: 90 tablet, Rfl: 3   tamsulosin (FLOMAX) 0.4 MG CAPS capsule, Take 2 capsules (0.8 mg total) by mouth daily after supper., Disp: 90 capsule, Rfl: 3   umeclidinium-vilanterol (ANORO ELLIPTA) 62.5-25 MCG/ACT AEPB, Inhale 1 puff into the lungs daily., Disp: 30 each, Rfl: 12   Subjective:   PATIENT ID: Sean Reyes. GENDER: male DOB: 30-Jan-1941, MRN: [id number]  Chief Complaint  Patient presents with   Follow-up    SOB with exertion and prod cough with clear sputum     HPI  [name] is a pleasant 82 year old male presenting for follow up on shortness of breath.  He presents today for follow up. He reports increased shortness of breath and exertional dyspnea compared to a few months ago. Also reporting increased back pain. Patient has no cough, wheeze, chest pain, or chest tightness. He's considered inhaler therapy and feels that his symptoms are significant enough that he'd like to trial them again.  His symptoms started after suffering a heart attack. He has been seen by Dr. Kirke Reyes at the [facility] center and referred to pulmonary for further workup of his shortness of breath.   He has significant lower back and lower extremity pain, and has had a pain stimulator placed to control his symptoms. He reports that it is limiting to his activities of daily living and he has not been able to do the things he wants to do. He doesn't have any new symptoms. During a prior visit, I prescribed a LAMA/LABA combination therapy that he was hesitant to take after reading the side effects profile. He felt the risk/benefit balance did not favor the initiation of the medication at the time.   In addition to his CAD (RCA stent, 60% distal LAD), he's had an ablation procedure for Afib twice, with improvement. He was maintained on propafenone and metoprolol following the procedure. He will have a cardiac monitor placed and will follow up for that.   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 His father was also a Psychologist, occupational and he used to help him growing up. Denies any exposure to asbestos.  Ancillary information including prior medications, full medical/surgical/family/social histories, and PFTs (when available) are listed below and have been reviewed.   Review of Systems  Constitutional:  Negative for chills and fever.  Respiratory:   Positive for shortness of breath. Negative for cough, sputum production and wheezing.   Cardiovascular:  Negative for chest pain and palpitations.     Objective:   Vitals:   08/08/22 1503  BP: 130/60  Pulse: 73  Temp: 97.6 F (36.4 C)  TempSrc: Temporal  SpO2: 98%  Weight: 184 lb 12.8 oz (83.8 kg)  Height: 6\' 1"  (1.854 m)   98% on RA  BMI Readings from Last 3 Encounters:  08/08/22 24.38 kg/m  07/03/22 24.28 kg/m  06/20/22 24.28 kg/m   Wt Readings from Last 3 Encounters:  08/08/22 184 lb 12.8 oz (83.8 kg)  07/03/22 184 lb (83.5 kg)  06/20/22 184 lb (83.5 kg)    Physical Exam Constitutional:      General: He is not in acute distress.    Appearance: Normal appearance. He is not ill-appearing.  HENT:     Mouth/Throat:  Mouth: Mucous membranes are moist.  Eyes:     Pupils: Pupils are equal, round, and reactive to light.  Cardiovascular:     Rate and Rhythm: Normal rate. Rhythm irregular.     Pulses: Normal pulses.     Heart sounds: Normal heart sounds.  Pulmonary:     Effort: Pulmonary effort is normal.     Breath sounds: Normal breath sounds. No wheezing.  Abdominal:     General: Abdomen is flat.     Palpations: Abdomen is soft.  Musculoskeletal:     Cervical back: Normal range of motion.     Right lower leg: No edema.     Left lower leg: No edema.  Neurological:     General: No focal deficit present.     Mental Status: He is alert and oriented to person, place, and time.       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   Hyperlipidemia    Hypertension    Myocardial infarction (HCC) 12/2020   Nocturia    S/P ablation of atrial fibrillation    Ablative therapy   Sleep apnea    CPAP    Spinal stenosis    Tachycardia, unspecified      Family History  Problem Relation Age of Onset   Brain cancer Mother    Kidney disease Neg Hx    Prostate cancer Neg Hx    Kidney cancer Neg Hx    Bladder Cancer Neg Hx      Past Surgical History:  Procedure Laterality Date   ABLATION     ANTERIOR LAT LUMBAR FUSION N/A 06/27/2017   Procedure: Anterior Lateral Lumbar Interbody  Fusion - Lumbar Two-Lumbar Three - Lumbar Three-Lumbar Four, Posterior Lumbar Interbody Fusion Lumbar Four- Five;  Surgeon: Donalee Citrin, MD;  Location: [facility] OR;  Service: Neurosurgery;  Laterality: N/A;  Anterior Lateral Lumbar Interbody  Fusion - Lumbar Two-Lumbar Three - Lumbar Three-Lumbar Four, Posterior Lumbar Interbody Fusion Lumbar Four- Five   BACK SURGERY     CARDIAC CATHETERIZATION     CARDIOVERSION N/A 08/29/2018   Procedure: CARDIOVERSION;  Surgeon: Lamar Blinks, MD;  Location: [facility] ORS;  Service: Cardiovascular;  Laterality: N/A;   CARDIOVERSION N/A 09/24/2018   Procedure: CARDIOVERSION;  Surgeon: Lamar Blinks, MD;  Location: [facility] ORS;  Service: Cardiovascular;  Laterality: N/A;   COLONOSCOPY WITH PROPOFOL N/A 10/05/2015   Procedure: COLONOSCOPY WITH PROPOFOL;  Surgeon: Christena Deem, MD;  Location: [facility] ENDOSCOPY;  Service: Endoscopy;  Laterality: N/A;   COLONOSCOPY WITH PROPOFOL N/A 11/01/2020   Procedure: COLONOSCOPY WITH PROPOFOL;  Surgeon: Wyline Mood, MD;  Location: [facility] ENDOSCOPY;  Service: Gastroenterology;  Laterality: N/A;   CORONARY STENT INTERVENTION N/A 12/22/2020   Procedure: CORONARY STENT INTERVENTION;  Surgeon: Marcina Millard, MD;  Location: [facility] INVASIVE CV LAB;  Service: Cardiovascular;  Laterality:  N/A;   ESOPHAGOGASTRODUODENOSCOPY N/A  11/01/2020   Procedure: ESOPHAGOGASTRODUODENOSCOPY (EGD);  Surgeon: Wyline Mood, MD;  Location: [facility] ENDOSCOPY;  Service: Gastroenterology;  Laterality: N/A;   ESOPHAGOGASTRODUODENOSCOPY (EGD) WITH PROPOFOL N/A 04/01/2018   Procedure: ESOPHAGOGASTRODUODENOSCOPY (EGD) WITH PROPOFOL;  Surgeon: Christena Deem, MD;  Location: [facility] ENDOSCOPY;  Service: Endoscopy;  Laterality: N/A;   ESOPHAGOGASTRODUODENOSCOPY (EGD) WITH PROPOFOL N/A 06/01/2022   Procedure: ESOPHAGOGASTRODUODENOSCOPY (EGD) WITH PROPOFOL;  Surgeon: Wyline Mood, MD;  Location: [facility] ENDOSCOPY;  Service: Gastroenterology;  Laterality: N/A;   EYE SURGERY     HERNIA REPAIR     JOINT REPLACEMENT Bilateral    hips  RT+  LEFT X2    LEFT HEART CATH AND CORONARY ANGIOGRAPHY N/A 12/22/2020   Procedure: LEFT HEART CATH AND CORONARY ANGIOGRAPHY;  Surgeon: Marcina Millard, MD;  Location: [facility] INVASIVE CV LAB;  Service: Cardiovascular;  Laterality: N/A;   LEFT HEART CATH AND CORONARY ANGIOGRAPHY Left 08/23/2021   Procedure: LEFT HEART CATH AND CORONARY ANGIOGRAPHY;  Surgeon: Lamar Blinks, MD;  Location: [facility] INVASIVE CV LAB;  Service: Cardiovascular;  Laterality: Left;   LUMBAR LAMINECTOMY/DECOMPRESSION MICRODISCECTOMY Left 09/13/2016   Procedure: Microdiscectomy - Lumbar two-three,  Lumbar three- - left;  Surgeon: Donalee Citrin, MD;  Location: [facility] OR;  Service: Neurosurgery;  Laterality: Left;   SPINAL CORD STIMULATOR INSERTION  07/08/2019   TONSILLECTOMY      Social History   Socioeconomic History   Marital status: Married    Spouse name: Elnita Maxwell    Number of children: 2   Years of education: Not on file   Highest education level: High school graduate  Occupational History   Occupation: retired   Tobacco Use   Smoking status: Never   Smokeless tobacco: Never  Vaping Use   Vaping Use: Never used  Substance and Sexual Activity   Alcohol use: No   Drug use: No   Sexual activity: Not on file  Other Topics Concern   Not on  file  Social History Narrative   MarriedGets regular exercise.      Lives in [place] with wife/ daughter. Never smoked; no alcohol. 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    Social Determinants of Health   Financial Resource Strain: Low Risk  (01/20/2022)   Overall Financial Resource Strain (CARDIA)    Difficulty of Paying Living Expenses: Not hard at all  Food Insecurity: No Food Insecurity (01/20/2022)   Hunger Vital Sign    Worried About Running Out of Food in the Last Year: Never true    Ran Out of Food in the Last Year: Never true  Transportation Needs: No Transportation Needs (01/20/2022)   PRAPARE - Administrator, Civil Service (Medical): No    Lack of Transportation (Non-Medical): No  Physical Activity: Insufficiently Active (01/20/2022)   Exercise Vital Sign    Days of Exercise per Week: 3 days    Minutes of Exercise per Session: 20 min  Stress: No Stress Concern Present (01/20/2022)   [employer] of Occupational Health - Occupational Stress Questionnaire    Feeling of Stress : Only a little  Social Connections: Moderately Integrated (01/20/2022)   Social Connection and Isolation Panel [NHANES]    Frequency of Communication with Friends and Family: More than three times a week    Frequency of Social Gatherings with Friends and Family: More than three times a week    Attends Religious Services: More than 4 times per year    Active Member  of Clubs or Organizations: No    Attends Banker Meetings: Never    Marital Status: Married  Catering manager Violence: Not At Risk (01/20/2022)   Humiliation, Afraid, Rape, and Kick questionnaire    Fear of Current or Ex-Partner: No    Emotionally Abused: No    Physically Abused: No    Sexually Abused: No     Allergies  Allergen Reactions   Levaquin [Levofloxacin In D5w] Anaphylaxis and Shortness Of Breath   Shellfish Allergy Anaphylaxis    Has used duraprep,  betadine and ioban in previous surgeries in 2019 and 2018 without issue   Amiodarone Other (See Comments)    Tremors and thyroid toxicity   Adhesive [Tape] Other (See Comments)    Little red bumps under the dressing.  He questions whether is latex related     CBC    Component Value Date/Time   WBC 6.6 05/25/2022 2017   RBC 4.04 (L) 05/25/2022 2017   HGB 12.6 (L) 05/25/2022 2017   HGB 12.2 (L) 04/04/2022 1118   HCT 37.2 (L) 05/25/2022 2017   HCT 37.5 04/04/2022 1118   PLT 155 05/25/2022 2017   PLT 163 04/04/2022 1118   MCV 92.1 05/25/2022 2017   MCV 94 04/04/2022 1118   MCH 31.2 05/25/2022 2017   MCHC 33.9 05/25/2022 2017   RDW 13.4 05/25/2022 2017   RDW 13.3 04/04/2022 1118   LYMPHSABS 1.6 04/12/2022 1238   LYMPHSABS 1.3 04/04/2022 1118   MONOABS 0.6 04/12/2022 1238   EOSABS 0.0 04/12/2022 1238   EOSABS 0.0 04/04/2022 1118   BASOSABS 0.0 04/12/2022 1238   BASOSABS 0.0 04/04/2022 1118    Pulmonary Functions Testing Results:    Latest Ref Rng & Units 10/26/2021    2:56 PM  PFT Results  FVC-Pre L 4.03   FVC-Predicted Pre % 87   FVC-Post L 4.03   FVC-Predicted Post % 87   Pre FEV1/FVC % % 58   Post FEV1/FCV % % 63   FEV1-Pre L 2.34   FEV1-Predicted Pre % 70   FEV1-Post L 2.54   DLCO uncorrected ml/min/mmHg 19.67   DLCO UNC% % 73   DLVA Predicted % 80   TLC L 6.11   TLC % Predicted % 79   RV % Predicted % 75     Outpatient Medications Prior to Visit  Medication Sig Dispense Refill   amLODipine (NORVASC) 5 MG tablet Take 1 tablet (5 mg total) by mouth at bedtime. 90 tablet 3   benazepril (LOTENSIN) 20 MG tablet Take 1 tablet (20 mg total) by mouth daily. 90 tablet 3   cyanocobalamin 1000 MCG tablet Take by mouth.     ELIQUIS 5 MG TABS tablet Take 1 tablet (5 mg total) by mouth 2 (two) times daily. 180 tablet 4   ferrous sulfate 325 (65 FE) MG EC tablet Take 325 mg by mouth daily with breakfast.     finasteride (PROSCAR) 5 MG tablet Take 1 tablet (5 mg total) by  mouth daily. 90 tablet 3   isosorbide mononitrate (IMDUR) 30 MG 24 hr tablet Take 30 mg by mouth daily.     lidocaine (LIDODERM) 5 % Place 1 patch onto the skin daily. Remove & Discard patch within 12 hours or as directed by MD 30 patch 2   metFORMIN (GLUCOPHAGE) 500 MG tablet Take 1 tablet (500 mg total) by mouth 2 (two) times daily with a meal. 180 tablet 4   metoprolol succinate (TOPROL XL) 25 MG  24 hr tablet Take 0.5 tablets (12.5 mg total) by mouth daily. 45 tablet 1   omeprazole (PRILOSEC) 40 MG capsule Take 1 capsule (40 mg total) by mouth in the morning and at bedtime. 180 capsule 3   rosuvastatin (CRESTOR) 5 MG tablet Take 1 tablet (5 mg total) by mouth daily. 90 tablet 4   solifenacin (VESICARE) 5 MG tablet Take 1 tablet (5 mg total) by mouth daily. 90 tablet 3   tamsulosin (FLOMAX) 0.4 MG CAPS capsule Take 2 capsules (0.8 mg total) by mouth daily after supper. 90 capsule 3   No facility-administered medications prior to visit.

## 2022-08-29 ENCOUNTER — Telehealth: Payer: Self-pay | Admitting: Cardiovascular Disease

## 2022-08-29 NOTE — Telephone Encounter (Signed)
STAT if HR is under 50 or over 120 (normal HR is 60-100 beats per minute)  What is your heart rate? 42  Do you have a log of your heart rate readings (document readings)? No  Do you have any other symptoms? Some SOB and dizziness

## 2022-08-29 NOTE — Telephone Encounter (Addendum)
Spoke with patient and he stated that his heart rate was low on Sunday prior to church at rest being 42 BPM and he felt light headed. Patient stated that he stated home because he felt "funny." Patient took his HR this morning at rising and it was 42 again but later went up to 62. Informed patient that his resting heart rate is low but once he moves it does increase. Instructed patient to remain hydrated, pace himself, stay cool, stay indoors, and change positions slowly with the increased temperature. Patient denies chest pain, dyspnea, and nausea/vomiting. Patient has follow-up scheduled 09/26/22 which he was reminded of to keep. Patient understood with read back

## 2022-09-04 ENCOUNTER — Telehealth: Payer: Medicare Other

## 2022-09-04 ENCOUNTER — Ambulatory Visit (INDEPENDENT_AMBULATORY_CARE_PROVIDER_SITE_OTHER): Payer: Medicare Other

## 2022-09-04 DIAGNOSIS — E1143 Type 2 diabetes mellitus with diabetic autonomic (poly)neuropathy: Secondary | ICD-10-CM

## 2022-09-04 DIAGNOSIS — I152 Hypertension secondary to endocrine disorders: Secondary | ICD-10-CM

## 2022-09-04 DIAGNOSIS — G894 Chronic pain syndrome: Secondary | ICD-10-CM

## 2022-09-04 DIAGNOSIS — N1831 Chronic kidney disease, stage 3a: Secondary | ICD-10-CM

## 2022-09-04 NOTE — Patient Instructions (Signed)
Please call the care guide team at [phone] if you need to cancel or reschedule your appointment.   If you are experiencing a Mental Health or Behavioral Health Crisis or need someone to talk to, please call the Suicide and Crisis Lifeline: 988 call the [place] National Suicide Prevention Lifeline: 820-095-1840 or TTY: (561) 245-0629 TTY (534)293-7242) to talk to a trained counselor call 1-800-273-TALK (toll free, 24 hour hotline)   Following is a copy of the CCM Program Consent:  CCM service includes personalized support from designated clinical staff supervised by the physician, including individualized plan of care and coordination with other care providers 24/7 contact phone numbers for assistance for urgent and routine care needs. Service will only be billed when office clinical staff spend 20 minutes or more in a month to coordinate care. Only one practitioner may furnish and bill the service in a calendar month. The patient may stop CCM services at amy time (effective at the end of the month) by phone call to the office staff. 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 The patient states his blood sugars are doing well but he is having a lot of  weakness in his legs along with pain due to neuropathy. The patient states that he has started to go to the gym 3 times a week with his wife to see if he can build up some muscle and strength in his legs. He easily gets tired when ambulating and sometime more short of breath. He is pacing his activity. He saw the specialist about his back pain and weakness. They could not find any acute changes but the patient says he is still having a time with back pain and discomfort. They added 5 more programs to his stimulator and he is trying to not over do it. He states that he has to pace his activity and when he overdoes it he pays for it with more pain. Reflective listening and support given  Reviewed medications with patient and discussed importance of medication adherence. The patient is compliant with medications;        Reviewed prescribed diet with patient heart healthy/ADA diet. Review of heart healthy/ADA diet. The patient says his swallowing may be a little better but not a lot. 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 The patient has regular lab work. Reviewed with the patient that his A1C is up from 6.5 to 7.0. Education on monitoring for acute changes and calling the pcp for recommendations.;        Discussed plans with patient  for ongoing care management follow up and provided patient with direct contact information for care management team. Is appreciative of the CCM team calling and checking on him and helping him with his health and well being;      Provided patient with written educational materials related to hypo and hyperglycemia and importance of correct treatment;       Reviewed scheduled/upcoming provider appointments including: 10-03-2022 at 1120 am;         Advised patient, providing education and rationale, to check cbg twice daily and when you have  symptoms of low or high blood sugar and record. Denies any acute changes in his blood sugars.       call provider for findings outside established parameters;       Review of patient status, including review of consultants reports, relevant laboratory and other test results, and medications completed;       Advised patient to discuss changes in her DM health and well being with provider;      Screening for signs and symptoms of depression related to chronic disease state;        Assessed social determinant of health barriers;         Symptom Management: Take medications as prescribed   Attend all scheduled provider appointments Call provider office for new concerns or questions  call the Suicide and Crisis Lifeline: 988 call the [place] National Suicide Prevention Lifeline: (403)173-2191 or TTY: 267-343-4812 TTY 6810675123) to talk to a trained counselor call 1-800-273-TALK (toll free, 24 hour hotline) if experiencing a Mental Health or Behavioral Health Crisis  check feet daily for cuts, sores or redness trim toenails straight across manage portion size wash and dry feet carefully every day wear comfortable, cotton socks wear comfortable, well-fitting shoes  Follow Up Plan: Telephone follow up appointment with care management team member scheduled for: 10-19-2022 at 230 pm       CCM Expected Outcome:  Monitor, Self-Manage and Reduce Symptoms of: CKD       Current Barriers:  Chronic Disease Management support and education needs related to effective management of CKD Lab Results  Component Value Date   CREATININE 1.23 07/03/2022   CREATININE 1.18 05/25/2022   CREATININE 1.37 (H) 04/12/2022    Lab Results  Component Value Date   CREATININE 1.23 07/03/2022   BUN 20 07/03/2022   NA 134 07/03/2022   K 4.3 07/03/2022   CL 96 07/03/2022   CO2 23 07/03/2022     Planned Interventions: Assessed the patient understanding of chronic kidney disease. The patient had labs. Reviewed labs  with the patient. Denies any acute changes in his CKD or kidney function. States that his kidney function is good.    Evaluation of current treatment plan related to chronic kidney disease self management and patient's adherence to plan as established by provider.       Provided education to patient re: stroke prevention, s/s of heart attack and stroke    Reviewed prescribed diet heart healthy/ADA diet. The patient is compliant with dietary restrictions. The patient states he is doing well and watching what he eats. Reviewed medications with patient and discussed importance of compliance. The patient is compliant with medications    Advised patient, providing education and rationale, to monitor blood pressure daily and record, calling PCP for findings outside established parameters    Discussed complications of poorly controlled blood pressure such as heart disease, stroke, circulatory complications, vision complications, kidney impairment, sexual dysfunction    Reviewed  scheduled/upcoming provider appointments including: 10-03-2022 at 1120 am  Advised patient to discuss changes in his kidney function, or questions and concerns related to CKD with provider    Discussed plans with patient for ongoing care management follow up and provided patient with direct contact information for care management team    Screening for signs and symptoms of depression related to chronic disease state      Discussed the impact of chronic kidney disease on daily life and mental health and acknowledged and normalized feelings of disempowerment, fear, and frustration    Assessed social determinant of health barriers    Provided education on kidney disease progression    Engage patient in early, proactive and ongoing discussion about goals of care and what matters most to them    Support coping and stress management by recognizing current strategies and assist in developing new strategies such as mindfulness, journaling,  relaxation techniques, problem-solving. The patient is still dealing with ongoing chronic back pain and leg weakness. Sees provider on a regular basis. Denies falls. The patients daughter had back surgery today and he has been up since 330 pm. The patient was tired. The patient states his back pain is no better. Denies falls but states his legs are weak. Education given.  Take medications as prescribed   Attend all scheduled provider appointments Call provider office for new concerns or questions  call the Suicide and Crisis Lifeline: 988 call the [place] National Suicide Prevention Lifeline: (562)347-3741 or TTY: 318-664-0357 TTY 253-726-2863) to talk to a trained counselor call 1-800-273-TALK (toll free, 24 hour hotline) if experiencing a Mental Health or Behavioral Health Crisis   Follow Up Plan: Telephone follow up appointment with care management team member scheduled for: 10-19-2022 at 230 pm       CCM Expected Outcome:  Monitor, Self-Manage, and Reduce Symptoms of Hypertension       Current Barriers:  Knowledge Deficits related to fluctuations in blood pressures and the need to have normalized blood pressures to prevent the risk of heart attack and stroke Chronic Disease Management support and education needs related to effective management of HTN BP Readings from Last 3 Encounters:  08/08/22 130/60  07/03/22 138/72  06/20/22 (!) 140/78     Planned Interventions: Evaluation of current treatment plan related to hypertension self management and patient's adherence to plan as established by provider. The patient saw the cardiologist recently. No changes to the plan of care. His HTN and heart health are stable.  Provided education to patient re: stroke prevention, s/s of heart attack and stroke; Reviewed prescribed diet heart healthy/ADA diet. The patient is compliant with dietary restrictions. The patient has decided to stay away from [employer] salads.  Reviewed medications with patient and  discussed importance of compliance. The patient states compliance with medications.  Discussed plans with patient for ongoing care management follow up and provided patient with direct contact information for care management team; Advised patient, providing education and rationale, to monitor blood pressure daily and record, calling PCP for findings outside established parameters.   Reviewed scheduled/upcoming provider appointments including:  next pcp appointment on 10-03-2022 at 1120 am Advised patient to discuss changes in his HTN, or heart health  with provider; Provided education on prescribed diet Heart healthy/ADA diet ;  Discussed complications of poorly controlled blood pressure such as heart disease, stroke, circulatory complications, vision complications, kidney impairment, sexual dysfunction;  Screening for signs and symptoms of depression related to chronic disease state;  Assessed social determinant of  health barriers;   Symptom Management: Take medications as prescribed   Attend all scheduled provider appointments Call provider office for new concerns or questions  call the Suicide and Crisis Lifeline: 988 call the [place] National Suicide Prevention Lifeline: 614-570-5313 or TTY: (475)194-6332 TTY (252) 786-4853) to talk to a trained counselor call 1-800-273-TALK (toll free, 24 hour hotline) if experiencing a Mental Health or Behavioral Health Crisis  check blood pressure 3 times per week learn about high blood pressure keep a blood pressure log take blood pressure log to all doctor appointments call doctor for signs and symptoms of high blood pressure develop an action plan for high blood pressure keep all doctor appointments take medications for blood pressure exactly as prescribed report new symptoms to your doctor  Follow Up Plan: Telephone follow up appointment with care management team member scheduled for: 10-19-2022 at 230 pm          Patient verbalizes  understanding of instructions and care plan provided today and agrees to view in MyChart. Active MyChart status and patient understanding of how to access instructions and care plan via MyChart confirmed with patient.  Telephone follow up appointment with care management team member scheduled for: 10-19-2022 at 230 pm

## 2022-09-04 NOTE — Chronic Care Management (AMB) (Signed)
Chronic Care Management   CCM RN Visit Note  09/04/2022 Name: Sean Reyes. MRN: [id number] DOB: 1940-06-20  Subjective: Sean Andres. is a 82 y.o. year old male who is a primary care patient of [name], Dorie Rank, NP. The patient was referred to the Chronic Care Management team for assistance with care management needs subsequent to provider initiation of CCM services and plan of care.    Today's Visit:  Engaged with patient by telephone for follow up visit.     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 The patient states that he has started to go to the gym 3 times a week with his wife to see if he can build up some muscle and strength in his legs. He easily gets tired when ambulating and sometime more short of breath. He is pacing his activity. He saw the specialist about his back pain and weakness. They could not find any acute changes but the patient says he is still having a time with back pain and discomfort. They added 5 more programs to his stimulator and he is trying to not over do it. He states that he has to pace his activity and  when he overdoes it he pays for it with more pain. Reflective listening and support given  Reviewed medications with patient and discussed importance of medication adherence. The patient is compliant with medications;        Reviewed prescribed diet with patient heart healthy/ADA diet. Review of heart healthy/ADA diet. The patient says his swallowing may be a little better but not a lot. 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 The patient has regular lab work. Reviewed with the patient that his A1C is up from 6.5 to 7.0. Education on monitoring for acute changes and calling the pcp for recommendations.;        Discussed plans with patient for ongoing care management follow up and provided patient with direct contact information for care management team. Is appreciative of the CCM team calling and checking on him and helping him with his health and well being;      Provided patient with written educational materials related to hypo and hyperglycemia and importance of correct treatment;       Reviewed scheduled/upcoming provider appointments including: 10-03-2022 at 1120 am;         Advised patient, providing education and rationale, to check cbg twice  daily and when you have symptoms of low or high blood sugar and record. Denies any acute changes in his blood sugars.       call provider for findings outside established parameters;       Review of patient status, including review of consultants reports, relevant laboratory and other test results, and medications completed;       Advised patient to discuss changes in her DM health and well being with provider;      Screening for signs and symptoms of depression related to chronic disease state;        Assessed social determinant of health barriers;         Symptom Management: Take medications as prescribed    Attend all scheduled provider appointments Call provider office for new concerns or questions  call the Suicide and Crisis Lifeline: 988 call the [place] National Suicide Prevention Lifeline: 8063126674 or TTY: 985-081-3364 TTY (408)620-2106) to talk to a trained counselor call 1-800-273-TALK (toll free, 24 hour hotline) if experiencing a Mental Health or Behavioral Health Crisis  check feet daily for cuts, sores or redness trim toenails straight across manage portion size wash and dry feet carefully every day wear comfortable, cotton socks wear comfortable, well-fitting shoes  Follow Up Plan: Telephone follow up appointment with care management team member scheduled for: 10-19-2022 at 230 pm       CCM Expected Outcome:  Monitor, Self-Manage and Reduce Symptoms of: CKD       Current Barriers:  Chronic Disease Management support and education needs related to effective management of CKD Lab Results  Component Value Date   CREATININE 1.23 07/03/2022   CREATININE 1.18 05/25/2022   CREATININE 1.37 (H) 04/12/2022    Lab Results  Component Value Date   CREATININE 1.23 07/03/2022   BUN 20 07/03/2022   NA 134 07/03/2022   K 4.3 07/03/2022   CL 96 07/03/2022   CO2 23 07/03/2022     Planned Interventions: Assessed the patient understanding of chronic kidney disease. The patient had labs. Reviewed labs with the patient. Denies any acute changes in his CKD or kidney function. States that his kidney function is good.    Evaluation of current treatment plan related to chronic kidney disease self management and patient's adherence to plan as established by provider.       Provided education to patient re: stroke prevention, s/s of heart attack and stroke    Reviewed prescribed diet heart healthy/ADA diet. The patient is compliant with dietary restrictions. The patient states he is doing well and watching what he eats. Reviewed medications with patient and discussed importance of compliance.  The patient is compliant with medications    Advised patient, providing education and rationale, to monitor blood pressure daily and record, calling PCP for findings outside established parameters    Discussed complications of poorly controlled blood pressure such as heart disease, stroke, circulatory complications, vision complications, kidney impairment, sexual dysfunction    Reviewed scheduled/upcoming provider appointments including: 10-03-2022 at 1120 am  Advised patient to discuss changes in his kidney function, or questions and concerns related to CKD with provider    Discussed plans with patient for ongoing care management follow up and provided patient with direct contact information for care management team    Screening for signs and symptoms of depression related to chronic disease state      Discussed the impact of chronic kidney disease on daily life and mental health and acknowledged and normalized feelings of disempowerment,  fear, and frustration    Assessed social determinant of health barriers    Provided education on kidney disease progression    Engage patient in early, proactive and ongoing discussion about goals of care and what matters most to them    Support coping and stress management by recognizing current strategies and assist in developing new strategies such as mindfulness, journaling, relaxation techniques, problem-solving. The patient is still dealing with ongoing chronic back pain and leg weakness. Sees provider on a regular basis. Denies falls. The patients daughter had back surgery today and he has been up since 330 pm. The patient was tired. The patient states his back pain is no better. Denies falls but states his legs are weak. Education given.  Take medications as prescribed   Attend all scheduled provider appointments Call provider office for new concerns or questions  call the Suicide and Crisis Lifeline: 988 call the [place] National Suicide Prevention Lifeline:  916-121-8495 or TTY: 201 374 1090 TTY (701)669-6302) to talk to a trained counselor call 1-800-273-TALK (toll free, 24 hour hotline) if experiencing a Mental Health or Behavioral Health Crisis   Follow Up Plan: Telephone follow up appointment with care management team member scheduled for: 10-19-2022 at 230 pm       CCM Expected Outcome:  Monitor, Self-Manage, and Reduce Symptoms of Hypertension       Current Barriers:  Knowledge Deficits related to fluctuations in blood pressures and the need to have normalized blood pressures to prevent the risk of heart attack and stroke Chronic Disease Management support and education needs related to effective management of HTN BP Readings from Last 3 Encounters:  08/08/22 130/60  07/03/22 138/72  06/20/22 (!) 140/78     Planned Interventions: Evaluation of current treatment plan related to hypertension self management and patient's adherence to plan as established by provider. The patient saw the cardiologist recently. No changes to the plan of care. His HTN and heart health are stable.  Provided education to patient re: stroke prevention, s/s of heart attack and stroke; Reviewed prescribed diet heart healthy/ADA diet. The patient is compliant with dietary restrictions. The patient has decided to stay away from [employer] salads.  Reviewed medications with patient and discussed importance of compliance. The patient states compliance with medications.  Discussed plans with patient for ongoing care management follow up and provided patient with direct contact information for care management team; Advised patient, providing education and rationale, to monitor blood pressure daily and record, calling PCP for findings outside established parameters.   Reviewed scheduled/upcoming provider appointments including:  next pcp appointment on 10-03-2022 at 1120 am Advised patient to discuss changes in his HTN, or heart health  with provider; Provided education on  prescribed diet Heart healthy/ADA diet ;  Discussed complications of poorly controlled blood pressure such as heart disease, stroke, circulatory complications, vision complications, kidney impairment, sexual dysfunction;  Screening for signs and symptoms of depression related to chronic disease state;  Assessed social determinant of health barriers;   Symptom Management: Take medications as prescribed   Attend all scheduled provider appointments Call provider office for new concerns or questions  call the Suicide and Crisis Lifeline: 988 call the [place] National Suicide Prevention Lifeline: 917-609-5099 or TTY: (937)549-8942 TTY 640-414-4006) to talk to a trained counselor call 1-800-273-TALK (toll free, 24 hour hotline) if experiencing a Mental Health or Behavioral Health Crisis  check blood pressure 3 times per week learn about high blood pressure keep a blood pressure log take blood pressure log to  all doctor appointments call doctor for signs and symptoms of high blood pressure develop an action plan for high blood pressure keep all doctor appointments take medications for blood pressure exactly as prescribed report new symptoms to your doctor  Follow Up Plan: Telephone follow up appointment with care management team member scheduled for: 10-19-2022 at 230 pm          Plan:Telephone follow up appointment with care management team member scheduled for:  10-19-2022 at 230 pm  Alto Denver RN, MSN, CCM RN Care Manager  Chronic Care Management Direct Number: [phone]

## 2022-09-06 ENCOUNTER — Other Ambulatory Visit: Payer: Self-pay

## 2022-09-10 NOTE — Progress Notes (Unsigned)
Celso Amy, PA-C [address]  [address]  [place], [place] [address]  Main: [phone]  Fax: [phone]   Primary Care Physician: Marjie Skiff, NP  Primary Gastroenterologist:  Celso Amy, PA-C / Dr. Wyline Mood    CC:  Nervous Stomach  HPI: Sean Reyes. is a 82 y.o. male, established patient of Dr. Tobi Bastos, returns for follow-up of multiple GI issues.  Last saw Dr. Tobi Bastos for office visit 02/2022.  Has history of dysphagia, Schatzki's ring that was dilated, C. difficile diarrhea treated in 2022, Salmonella treated in 2021, iron deficiency anemia, GERD with esophagitis, and hiatal hernia.    Recent EGD 05/2022 by Dr. Tobi Bastos showed small hiatal hernia, Schatzki's ring at GE junction dilated to 20 mm, otherwise normal.  No biopsies.  RUQ ultrasound 05/26/2022 was normal.  No gallstones.  Barium swallow with tablet 02/27/2022 showed mild esophageal narrowing at the GE junction consistent with Schatzki's ring.  Mild esophageal dysmotility, small hiatal hernia, mild GERD.  Modified barium swallow test showed no aspiration.  -11/01/2020: EGD: Schatzki's ring seen at the GE junction dilated to 18 mm biopsies esophagus were taken medium size hiatal hernia was noted.  Esophageal biopsies normal. -10/2020:  Colonoscopy performed on the same day.  Nonbleeding internal hemorrhoids noted.  Otherwise normal.   -10/04/2020: C. difficile toxin positive.  Started on vancomycin 4 times daily. -09/29/2020: B12 and folate normal. -2020: EGD by Dr. Marva Panda for dysphagia, found to have a hiatal hernia, LA grade B esophagitis.    -2017:  Colonoscopy in 2017 showed diverticulosis, multiple nonbleeding AVMs throughout the colon.     He is on Eliquis.  Has history of atrial fibrillation.  Followed by cardiology.  Current GI symptoms: He reports having spasms in his mid upper abdomen intermittently.  It is worse when he has irregular heart rhythm.  He is not having any abdominal pain.  He feels like  he is having a nervous stomach.  Has increased gas.  Chronic lifelong diarrhea/loose stools.  Denies hematochezia, melena, weight loss, nausea, or vomiting.  Not having abdominal pain.  Dysphagia improved post dilation 05/2022.  He has mild intermittent belly swallowing large pills.  No recent episodes of solid food dysphagia.    Current Outpatient Medications  Medication Sig Dispense Refill   cyanocobalamin 1000 MCG tablet Take by mouth.     ELIQUIS 5 MG TABS tablet Take 1 tablet (5 mg total) by mouth 2 (two) times daily. 180 tablet 4   ferrous sulfate 325 (65 FE) MG EC tablet Take 325 mg by mouth daily with breakfast.     finasteride (PROSCAR) 5 MG tablet Take 1 tablet (5 mg total) by mouth daily. 90 tablet 3   isosorbide mononitrate (IMDUR) 30 MG 24 hr tablet Take 30 mg by mouth daily.     lidocaine (LIDODERM) 5 % Place 1 patch onto the skin daily. Remove & Discard patch within 12 hours or as directed by MD 30 patch 2   metFORMIN (GLUCOPHAGE) 500 MG tablet Take 1 tablet (500 mg total) by mouth 2 (two) times daily with a meal. 180 tablet 4   metoprolol succinate (TOPROL XL) 25 MG 24 hr tablet Take 0.5 tablets (12.5 mg total) by mouth daily. 45 tablet 1   omeprazole (PRILOSEC) 40 MG capsule Take 1 capsule (40 mg total) by mouth in the morning and at bedtime. 180 capsule 3   rosuvastatin (CRESTOR) 5 MG tablet Take 1 tablet (5 mg total) by mouth daily.  90 tablet 4   solifenacin (VESICARE) 5 MG tablet Take 1 tablet (5 mg total) by mouth daily. 90 tablet 3   No current facility-administered medications for this visit.    Allergies as of 09/11/2022 - Review Complete 09/11/2022  Allergen Reaction Noted   Levaquin [levofloxacin in d5w] Anaphylaxis and Shortness Of Breath 07/27/2014   Shellfish allergy Anaphylaxis 08/26/2014   Amiodarone Other (See Comments) 04/19/2015   Adhesive [tape] Other (See Comments) 04/19/2018    Past Medical History:  Diagnosis Date   Anemia    Anxiety    Arthritis     Arthritis of neck    Atrial fibrillation (HCC)    Cataracts, bilateral    Complication of anesthesia    pt reports low BP's after surgery at [facility] and difficulty awakening   Depression    Diabetes (HCC)    dx 6-8 yrs ago   Dysrhythmia    a-fib   GERD (gastroesophageal reflux disease)    OCC TAKES ALKA SELTZER   History of kidney stones    10-15 yrs ago   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   Hyperlipidemia    Hypertension    Myocardial infarction (HCC) 12/2020   Nocturia    S/P ablation of atrial fibrillation    Ablative therapy   Sleep apnea    CPAP    Spinal stenosis    Tachycardia, unspecified     Past Surgical History:  Procedure Laterality Date   ABLATION     ANTERIOR LAT LUMBAR FUSION N/A 06/27/2017   Procedure: Anterior Lateral Lumbar Interbody  Fusion - Lumbar Two-Lumbar Three - Lumbar Three-Lumbar Four, Posterior Lumbar Interbody Fusion Lumbar Four- Five;  Surgeon: Donalee Citrin, MD;  Location: [facility] OR;  Service: Neurosurgery;  Laterality: N/A;  Anterior Lateral Lumbar Interbody  Fusion - Lumbar Two-Lumbar Three - Lumbar Three-Lumbar Four, Posterior Lumbar Interbody Fusion Lumbar Four- Five   BACK SURGERY     CARDIAC CATHETERIZATION     CARDIOVERSION N/A 08/29/2018   Procedure: CARDIOVERSION;  Surgeon: Lamar Blinks, MD;  Location: [facility] ORS;  Service: Cardiovascular;  Laterality: N/A;   CARDIOVERSION N/A 09/24/2018   Procedure: CARDIOVERSION;  Surgeon: Lamar Blinks, MD;  Location: [facility] ORS;  Service: Cardiovascular;  Laterality: N/A;   COLONOSCOPY WITH PROPOFOL N/A 10/05/2015   Procedure: COLONOSCOPY WITH PROPOFOL;  Surgeon: Christena Deem, MD;  Location: [facility] ENDOSCOPY;  Service: Endoscopy;  Laterality: N/A;   COLONOSCOPY WITH PROPOFOL N/A 11/01/2020   Procedure: COLONOSCOPY WITH PROPOFOL;  Surgeon: Wyline Mood, MD;  Location: [facility] ENDOSCOPY;  Service: Gastroenterology;  Laterality: N/A;   CORONARY STENT INTERVENTION N/A 12/22/2020    Procedure: CORONARY STENT INTERVENTION;  Surgeon: Marcina Millard, MD;  Location: [facility] INVASIVE CV LAB;  Service: Cardiovascular;  Laterality: N/A;   ESOPHAGOGASTRODUODENOSCOPY N/A 11/01/2020   Procedure: ESOPHAGOGASTRODUODENOSCOPY (EGD);  Surgeon: Wyline Mood, MD;  Location: [facility] ENDOSCOPY;  Service: Gastroenterology;  Laterality: N/A;   ESOPHAGOGASTRODUODENOSCOPY (EGD) WITH PROPOFOL N/A 04/01/2018   Procedure: ESOPHAGOGASTRODUODENOSCOPY (EGD) WITH PROPOFOL;  Surgeon: Christena Deem, MD;  Location: [facility] ENDOSCOPY;  Service: Endoscopy;  Laterality: N/A;   ESOPHAGOGASTRODUODENOSCOPY (EGD) WITH PROPOFOL N/A 06/01/2022   Procedure: ESOPHAGOGASTRODUODENOSCOPY (EGD) WITH PROPOFOL;  Surgeon: Wyline Mood, MD;  Location: [facility] ENDOSCOPY;  Service: Gastroenterology;  Laterality: N/A;   EYE SURGERY     HERNIA REPAIR     JOINT REPLACEMENT Bilateral    hips  RT+  LEFT X2    LEFT HEART CATH AND CORONARY  ANGIOGRAPHY N/A 12/22/2020   Procedure: LEFT HEART CATH AND CORONARY ANGIOGRAPHY;  Surgeon: Marcina Millard, MD;  Location: [facility] INVASIVE CV LAB;  Service: Cardiovascular;  Laterality: N/A;   LEFT HEART CATH AND CORONARY ANGIOGRAPHY Left 08/23/2021   Procedure: LEFT HEART CATH AND CORONARY ANGIOGRAPHY;  Surgeon: Lamar Blinks, MD;  Location: [facility] INVASIVE CV LAB;  Service: Cardiovascular;  Laterality: Left;   LUMBAR LAMINECTOMY/DECOMPRESSION MICRODISCECTOMY Left 09/13/2016   Procedure: Microdiscectomy - Lumbar two-three,  Lumbar three- - left;  Surgeon: Donalee Citrin, MD;  Location: [facility] OR;  Service: Neurosurgery;  Laterality: Left;   SPINAL CORD STIMULATOR INSERTION  07/08/2019   TONSILLECTOMY      Review of Systems:    All systems reviewed and negative except where noted in HPI.   Physical Examination:   BP (!) 129/56   Pulse 60   Temp 98 F (36.7 C)   Ht 6\' 1"  (1.854 m)   Wt 184 lb 6.4 oz (83.6 kg)   BMI 24.33 kg/m   General: Well-nourished, well-developed in no acute distress.   Eyes: No icterus. Conjunctivae pink. Mouth: Oropharyngeal mucosa moist and pink , no lesions erythema or exudate. Lungs: Clear to auscultation bilaterally. Non-labored. Heart: Irregular rhythm, Normal Rate, no murmurs rubs or gallops.  Abdomen: Bowel sounds are normal; Abdomen is Soft; No hepatosplenomegaly, masses or hernias;  No Abdominal Tenderness; Ventral abdominal hernia - not tender.  No guarding or rebound tenderness. Extremities: No lower extremity edema. No clubbing or deformities. Neuro: Alert and oriented x 3.  Grossly intact. Skin: Warm and dry, no jaundice.   Psych: Alert and cooperative, normal mood and affect.   Imaging Studies: No results found.  Assessment and Plan:   Sean Reyes. is a 82 y.o. y/o male returns for follow-up of:  1.  Esophageal dysphagia with Schatzki's ring improved status post dilation 05/2022.  Reassurance regarding recent EGD results.  2.  GERD Continue current treatment: Prilosec 40 Mg once daily.  3.  Irritable bowel syndrome with diarrhea  Start IBgard (peppermint oil) 2 capsules twice daily, samples given.  If samples work well, then he can get more OTC.  If he has worsening diarrhea in spite of taking IBgard, then I recommend GI pathogen panel, C. difficile stool test, and pancreatic fecal elastase.  4.  Atrial fibrillation  I advised patient to follow-up with his cardiologist.  Celso Amy, PA-C  Follow up as needed based on GI symptoms.  Return for follow-up if GI symptoms worsen or persist.

## 2022-09-11 ENCOUNTER — Encounter: Payer: Self-pay | Admitting: Physician Assistant

## 2022-09-11 ENCOUNTER — Telehealth: Payer: Self-pay | Admitting: Cardiovascular Disease

## 2022-09-11 ENCOUNTER — Ambulatory Visit: Payer: Medicare Other | Admitting: Physician Assistant

## 2022-09-11 VITALS — BP 129/56 | HR 60 | Temp 98.0°F | Ht 73.0 in | Wt 184.4 lb

## 2022-09-11 DIAGNOSIS — I480 Paroxysmal atrial fibrillation: Secondary | ICD-10-CM

## 2022-09-11 DIAGNOSIS — R1310 Dysphagia, unspecified: Secondary | ICD-10-CM

## 2022-09-11 DIAGNOSIS — K580 Irritable bowel syndrome with diarrhea: Secondary | ICD-10-CM

## 2022-09-11 DIAGNOSIS — K529 Noninfective gastroenteritis and colitis, unspecified: Secondary | ICD-10-CM

## 2022-09-11 MED ORDER — IBGARD 90 MG PO CPCR: 2.0000 | ORAL_CAPSULE | Freq: Two times a day (BID) | ORAL | Status: AC

## 2022-09-11 NOTE — Telephone Encounter (Signed)
Patient c/o Palpitations:  STAT if patient reporting lightheadedness, shortness of breath, or chest pain  How long have you had palpitations/irregular HR/ Afib? Are you having the symptoms now? A month   Are you currently experiencing lightheadedness, SOB or CP? SOB, but has COPD  Do you have a history of afib (atrial fibrillation) or irregular heart rhythm? Yes  Have you checked your BP or HR? (document readings if available): 129/56 HR 60  Are you experiencing any other symptoms? No   Gastrologist advised him today his heart sounds out of rhythm.

## 2022-09-11 NOTE — Telephone Encounter (Signed)
I spoke with patient and he stated GI told him his heart was skipping a beat. Patient has no c/o palpitations at this time. His biggest complaint is muscle weakness and fatigue when he tries to walk.He said his neuropathy is not painful but he feels he has lost more muscle. In the past he has had PT and it has helped some.  We talked about his history of PAF and PVC's and if he is asymptomatic it is something we monitor.   He has f/u on 09/26/22 with cardiology

## 2022-09-13 DIAGNOSIS — E1159 Type 2 diabetes mellitus with other circulatory complications: Secondary | ICD-10-CM

## 2022-09-13 DIAGNOSIS — N1831 Chronic kidney disease, stage 3a: Secondary | ICD-10-CM

## 2022-09-13 DIAGNOSIS — I152 Hypertension secondary to endocrine disorders: Secondary | ICD-10-CM

## 2022-09-13 DIAGNOSIS — E1143 Type 2 diabetes mellitus with diabetic autonomic (poly)neuropathy: Secondary | ICD-10-CM

## 2022-09-19 ENCOUNTER — Ambulatory Visit (INDEPENDENT_AMBULATORY_CARE_PROVIDER_SITE_OTHER): Payer: Medicare Other | Admitting: Emergency Medicine

## 2022-09-19 VITALS — Ht 73.0 in | Wt 188.0 lb

## 2022-09-19 DIAGNOSIS — E1143 Type 2 diabetes mellitus with diabetic autonomic (poly)neuropathy: Secondary | ICD-10-CM

## 2022-09-19 DIAGNOSIS — Z0000 Encounter for general adult medical examination without abnormal findings: Secondary | ICD-10-CM

## 2022-09-19 NOTE — Patient Instructions (Addendum)
Sean Reyes , Thank you for taking time to come for your Medicare Wellness Visit. I appreciate your ongoing commitment to your health goals. Please review the following plan we discussed and let me know if I can assist you in the future.   Referrals/Orders/Follow-Ups/Clinician Recommendations: I have placed a referral to [facility]. They should call you with an appointment. Call and schedule an eye exam since it is due this month.  This is a list of the screening recommended for you and due dates:  Health Maintenance  Topic Date Due   COVID-19 Vaccine (4 - 2023-24 season) 10/14/2021   Flu Shot  09/14/2022   Eye exam for diabetics  09/20/2022   Hemoglobin A1C  01/03/2023   Yearly kidney health urinalysis for diabetes  04/05/2023   Yearly kidney function blood test for diabetes  07/03/2023   Complete foot exam   07/03/2023   Medicare Annual Wellness Visit  09/19/2023   DTaP/Tdap/Td vaccine (3 - Td or Tdap) 06/05/2030   Pneumonia Vaccine  Completed   Zoster (Shingles) Vaccine  Completed   HPV Vaccine  Aged Out   Colon Cancer Screening  Discontinued   Hepatitis C Screening  Discontinued    Advanced directives: (Copy Requested) Please bring a copy of your health care power of attorney and living will to the office to be added to your chart at your convenience.  Next Medicare Annual Wellness Visit scheduled for next year: Yes, 09/25/23 @ 2:15pm  Preventive Care 65 Years and Older, Male  Preventive care refers to lifestyle choices and visits with your health care provider that can promote health and wellness. What does preventive care include? A yearly physical exam. This is also called an annual well check. Dental exams once or twice a year. Routine eye exams. Ask your health care provider how often you should have your eyes checked. Personal lifestyle choices, including: Daily care of your teeth and gums. Regular physical activity. Eating a healthy diet. Avoiding tobacco  and drug use. Limiting alcohol use. Practicing safe sex. Taking low doses of aspirin every day. Taking vitamin and mineral supplements as recommended by your health care provider. What happens during an annual well check? The services and screenings done by your health care provider during your annual well check will depend on your age, overall health, lifestyle risk factors, and family history of disease. Counseling  Your health care provider may ask you questions about your: Alcohol use. Tobacco use. Drug use. Emotional well-being. Home and relationship well-being. Sexual activity. Eating habits. History of falls. Memory and ability to understand (cognition). Work and work Astronomer. Screening  You may have the following tests or measurements: Height, weight, and BMI. Blood pressure. Lipid and cholesterol levels. These may be checked every 5 years, or more frequently if you are over 36 years old. Skin check. Lung cancer screening. You may have this screening every year starting at age 32 if you have a 30-pack-year history of smoking and currently smoke or have quit within the past 15 years. Fecal occult blood test (FOBT) of the stool. You may have this test every year starting at age 40. Flexible sigmoidoscopy or colonoscopy. You may have a sigmoidoscopy every 5 years or a colonoscopy every 10 years starting at age 17. Prostate cancer screening. Recommendations will vary depending on your family history and other risks. Hepatitis C blood test. Hepatitis B blood test. Sexually transmitted disease (STD) testing. Diabetes screening. This is done by checking your blood sugar (glucose)  after you have not eaten for a while (fasting). You may have this done every 1-3 years. Abdominal aortic aneurysm (AAA) screening. You may need this if you are a current or former smoker. Osteoporosis. You may be screened starting at age 1 if you are at high risk. Talk with your health care provider  about your test results, treatment options, and if necessary, the need for more tests. Vaccines  Your health care provider may recommend certain vaccines, such as: Influenza vaccine. This is recommended every year. Tetanus, diphtheria, and acellular pertussis (Tdap, Td) vaccine. You may need a Td booster every 10 years. Zoster vaccine. You may need this after age 27. Pneumococcal 13-valent conjugate (PCV13) vaccine. One dose is recommended after age 36. Pneumococcal polysaccharide (PPSV23) vaccine. One dose is recommended after age 23. Talk to your health care provider about which screenings and vaccines you need and how often you need them. This information is not intended to replace advice given to you by your health care provider. Make sure you discuss any questions you have with your health care provider. Document Released: 02/26/2015 Document Revised: 10/20/2015 Document Reviewed: 12/01/2014 Elsevier Interactive Patient Education  2017 [employer].  Fall Prevention in the Home Falls can cause injuries. They can happen to people of all ages. There are many things you can do to make your home safe and to help prevent falls. What can I do on the outside of my home? Regularly fix the edges of walkways and driveways and fix any cracks. Remove anything that might make you trip as you walk through a door, such as a raised step or threshold. Trim any bushes or trees on the path to your home. Use bright outdoor lighting. Clear any walking paths of anything that might make someone trip, such as rocks or tools. Regularly check to see if handrails are loose or broken. Make sure that both sides of any steps have handrails. Any raised decks and porches should have guardrails on the edges. Have any leaves, snow, or ice cleared regularly. Use sand or salt on walking paths during winter. Clean up any spills in your garage right away. This includes oil or grease spills. What can I do in the  bathroom? Use night lights. Install grab bars by the toilet and in the tub and shower. Do not use towel bars as grab bars. Use non-skid mats or decals in the tub or shower. If you need to sit down in the shower, use a plastic, non-slip stool. Keep the floor dry. Clean up any water that spills on the floor as soon as it happens. Remove soap buildup in the tub or shower regularly. Attach bath mats securely with double-sided non-slip rug tape. Do not have throw rugs and other things on the floor that can make you trip. What can I do in the bedroom? Use night lights. Make sure that you have a light by your bed that is easy to reach. Do not use any sheets or blankets that are too big for your bed. They should not hang down onto the floor. Have a firm chair that has side arms. You can use this for support while you get dressed. Do not have throw rugs and other things on the floor that can make you trip. What can I do in the kitchen? Clean up any spills right away. Avoid walking on wet floors. Keep items that you use a lot in easy-to-reach places. If you need to reach something above you, use a  strong step stool that has a grab bar. Keep electrical cords out of the way. Do not use floor polish or wax that makes floors slippery. If you must use wax, use non-skid floor wax. Do not have throw rugs and other things on the floor that can make you trip. What can I do with my stairs? Do not leave any items on the stairs. Make sure that there are handrails on both sides of the stairs and use them. Fix handrails that are broken or loose. Make sure that handrails are as long as the stairways. Check any carpeting to make sure that it is firmly attached to the stairs. Fix any carpet that is loose or worn. Avoid having throw rugs at the top or bottom of the stairs. If you do have throw rugs, attach them to the floor with carpet tape. Make sure that you have a light switch at the top of the stairs and the  bottom of the stairs. If you do not have them, ask someone to add them for you. What else can I do to help prevent falls? Wear shoes that: Do not have high heels. Have rubber bottoms. Are comfortable and fit you well. Are closed at the toe. Do not wear sandals. If you use a stepladder: Make sure that it is fully opened. Do not climb a closed stepladder. Make sure that both sides of the stepladder are locked into place. Ask someone to hold it for you, if possible. Clearly mark and make sure that you can see: Any grab bars or handrails. First and last steps. Where the edge of each step is. Use tools that help you move around (mobility aids) if they are needed. These include: Canes. Walkers. Scooters. Crutches. Turn on the lights when you go into a dark area. Replace any light bulbs as soon as they burn out. Set up your furniture so you have a clear path. Avoid moving your furniture around. If any of your floors are uneven, fix them. If there are any pets around you, be aware of where they are. Review your medicines with your doctor. Some medicines can make you feel dizzy. This can increase your chance of falling. Ask your doctor what other things that you can do to help prevent falls. This information is not intended to replace advice given to you by your health care provider. Make sure you discuss any questions you have with your health care provider. Document Released: 11/26/2008 Document Revised: 07/08/2015 Document Reviewed: 03/06/2014 Elsevier Interactive Patient Education  2017 [employer].

## 2022-09-19 NOTE — Progress Notes (Signed)
Subjective:   Sean Reyes. is a 82 y.o. male who presents for Medicare Annual/Subsequent preventive examination.  Visit Complete: Virtual  I connected with  Sean Reyes. on 09/19/22 by a audio enabled telemedicine application and verified that I am speaking with the correct person using two identifiers.  Patient Location: Home  Provider Location: Home Office  I discussed the limitations of evaluation and management by telemedicine. The patient expressed understanding and agreed to proceed.  Vital Signs: Unable to obtain new vitals due to this being a telehealth visit.   Review of Systems     Cardiac Risk Factors include: advanced age (>81men, >9 women);diabetes mellitus;hypertension;dyslipidemia;male gender;Other (see comment), Risk factor comments: OSA uses CPAP     Objective:    Today's Vitals   09/19/22 1419 09/19/22 1420  Weight: 188 lb (85.3 kg)   Height: 6\' 1"  (1.854 m)   PainSc:  6    Body mass index is 24.8 kg/m.     09/19/2022    2:37 PM 06/01/2022    9:43 AM 04/12/2022    1:02 PM 10/07/2021    2:25 PM 09/22/2021    8:58 AM 09/12/2021    9:29 AM 08/05/2021    6:57 AM  Advanced Directives  Does Patient Have a Medical Advance Directive? Yes Yes Yes Yes  No No  Type of Estate agent of [place];Living will Living will Healthcare Power of [place];Living will Healthcare Power of [place];Living will     Does patient want to make changes to medical advance directive? No - Patient declined        Copy of Healthcare Power of Attorney in Chart? No - copy requested        Would patient like information on creating a medical advance directive?     No - Patient declined No - Patient declined     Current Medications (verified) Outpatient Encounter Medications as of 09/19/2022  Medication Sig   ANORO ELLIPTA 62.5-25 MCG/ACT AEPB Inhale 1 puff into the lungs daily.   cyanocobalamin 1000 MCG tablet Take by mouth.   ELIQUIS 5 MG TABS tablet Take 1  tablet (5 mg total) by mouth 2 (two) times daily.   ferrous sulfate 325 (65 FE) MG EC tablet Take 325 mg by mouth daily with breakfast.   finasteride (PROSCAR) 5 MG tablet Take 1 tablet (5 mg total) by mouth daily.   isosorbide mononitrate (IMDUR) 30 MG 24 hr tablet Take 30 mg by mouth daily.   metFORMIN (GLUCOPHAGE) 500 MG tablet Take 1 tablet (500 mg total) by mouth 2 (two) times daily with a meal.   omeprazole (PRILOSEC) 40 MG capsule Take 1 capsule (40 mg total) by mouth in the morning and at bedtime.   Peppermint Oil (IBGARD) 90 MG CPCR Take 2 capsules by mouth 2 (two) times daily.   rosuvastatin (CRESTOR) 5 MG tablet Take 1 tablet (5 mg total) by mouth daily.   solifenacin (VESICARE) 5 MG tablet Take 1 tablet (5 mg total) by mouth daily.   lidocaine (LIDODERM) 5 % Place 1 patch onto the skin daily. Remove & Discard patch within 12 hours or as directed by MD (Patient not taking: Reported on 09/19/2022)   metoprolol succinate (TOPROL XL) 25 MG 24 hr tablet Take 0.5 tablets (12.5 mg total) by mouth daily. (Patient not taking: Reported on 09/19/2022)   No facility-administered encounter medications on file as of 09/19/2022.    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   Hyperlipidemia    Hypertension    Myocardial infarction (HCC) 12/2020   Nocturia    S/P ablation of atrial fibrillation    Ablative therapy   Sleep apnea     CPAP    Spinal stenosis    Tachycardia, unspecified    Past Surgical History:  Procedure Laterality Date   ABLATION     ANTERIOR LAT LUMBAR FUSION N/A 06/27/2017   Procedure: Anterior Lateral Lumbar Interbody  Fusion - Lumbar Two-Lumbar Three - Lumbar Three-Lumbar Four, Posterior Lumbar Interbody Fusion Lumbar Four- Five;  Surgeon: Donalee Citrin, MD;  Location: [facility] OR;  Service: Neurosurgery;  Laterality: N/A;  Anterior Lateral Lumbar Interbody  Fusion - Lumbar Two-Lumbar Three - Lumbar Three-Lumbar Four, Posterior Lumbar Interbody Fusion Lumbar Four- Five   BACK SURGERY     CARDIAC CATHETERIZATION     CARDIOVERSION N/A 08/29/2018   Procedure: CARDIOVERSION;  Surgeon: Lamar Blinks, MD;  Location: [facility] ORS;  Service: Cardiovascular;  Laterality: N/A;   CARDIOVERSION N/A 09/24/2018   Procedure: CARDIOVERSION;  Surgeon: Lamar Blinks, MD;  Location: [facility] ORS;  Service: Cardiovascular;  Laterality: N/A;   COLONOSCOPY WITH PROPOFOL N/A 10/05/2015   Procedure: COLONOSCOPY WITH PROPOFOL;  Surgeon: Christena Deem, MD;  Location: [facility] ENDOSCOPY;  Service: Endoscopy;  Laterality: N/A;   COLONOSCOPY WITH PROPOFOL N/A 11/01/2020   Procedure: COLONOSCOPY WITH PROPOFOL;  Surgeon: Wyline Mood, MD;  Location: [facility] ENDOSCOPY;  Service: Gastroenterology;  Laterality: N/A;   CORONARY STENT INTERVENTION N/A 12/22/2020   Procedure: CORONARY STENT INTERVENTION;  Surgeon: Marcina Millard, MD;  Location: [facility] INVASIVE CV LAB;  Service: Cardiovascular;  Laterality: N/A;   ESOPHAGOGASTRODUODENOSCOPY N/A 11/01/2020   Procedure: ESOPHAGOGASTRODUODENOSCOPY (EGD);  Surgeon: Wyline Mood, MD;  Location: [facility] ENDOSCOPY;  Service: Gastroenterology;  Laterality: N/A;   ESOPHAGOGASTRODUODENOSCOPY (EGD) WITH PROPOFOL N/A 04/01/2018   Procedure: ESOPHAGOGASTRODUODENOSCOPY (EGD) WITH PROPOFOL;  Surgeon: Christena Deem, MD;  Location: [facility] ENDOSCOPY;  Service: Endoscopy;  Laterality: N/A;    ESOPHAGOGASTRODUODENOSCOPY (EGD) WITH PROPOFOL N/A 06/01/2022   Procedure: ESOPHAGOGASTRODUODENOSCOPY (EGD) WITH PROPOFOL;  Surgeon: Wyline Mood, MD;  Location: [facility] ENDOSCOPY;  Service: Gastroenterology;  Laterality: N/A;   EYE SURGERY     HERNIA REPAIR     JOINT REPLACEMENT Bilateral    hips  RT+  LEFT X2    LEFT HEART CATH AND CORONARY ANGIOGRAPHY N/A 12/22/2020   Procedure: LEFT HEART CATH AND CORONARY ANGIOGRAPHY;  Surgeon: Marcina Millard, MD;  Location: [facility] INVASIVE CV LAB;  Service: Cardiovascular;  Laterality: N/A;   LEFT HEART CATH AND CORONARY ANGIOGRAPHY Left 08/23/2021   Procedure: LEFT HEART CATH AND CORONARY ANGIOGRAPHY;  Surgeon: Lamar Blinks, MD;  Location: [facility] INVASIVE CV LAB;  Service: Cardiovascular;  Laterality: Left;   LUMBAR LAMINECTOMY/DECOMPRESSION MICRODISCECTOMY Left 09/13/2016   Procedure: Microdiscectomy -  Lumbar two-three,  Lumbar three- - left;  Surgeon: Donalee Citrin, MD;  Location: [facility] OR;  Service: Neurosurgery;  Laterality: Left;   SPINAL CORD STIMULATOR INSERTION  07/08/2019   TONSILLECTOMY     Family History  Problem Relation Age of Onset   Brain cancer Mother    Other Father        "blood clots in his lungs"   Kidney disease Neg Hx    Prostate cancer Neg Hx    Kidney cancer Neg Hx    Bladder Cancer Neg Hx    Social History   Socioeconomic History   Marital status: Married    Spouse name: Elnita Maxwell    Number of children: 2   Years of education: Not on file   Highest education level: High school graduate  Occupational History   Occupation: retired   Tobacco Use   Smoking status: Never   Smokeless tobacco: Never  Vaping Use   Vaping status: Never Used  Substance and Sexual Activity   Alcohol use: No   Drug use: No   Sexual activity: Not on file  Other Topics Concern   Not on file  Social History Narrative   MarriedGets regular exercise.      Lives in [place] with wife/ daughter. Never smoked; no alcohol. 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    Social Determinants of Health   Financial Resource Strain: Low Risk  (09/19/2022)   Overall Financial Resource Strain (CARDIA)    Difficulty of Paying Living Expenses: Not hard at all  Food Insecurity: No Food Insecurity (09/19/2022)   Hunger Vital Sign    Worried About Running Out of Food in the Last Year: Never true    Ran Out of Food in the Last Year: Never true  Transportation Needs: No Transportation Needs (09/19/2022)   PRAPARE - Administrator, Civil Service (Medical): No    Lack of Transportation (Non-Medical): No  Physical Activity: Inactive (09/19/2022)   Exercise Vital Sign    Days of Exercise per Week: 0 days    Minutes of Exercise per Session: 0 min  Stress: No Stress Concern Present (09/19/2022)   [employer] of Occupational Health - Occupational Stress Questionnaire    Feeling of Stress : Not at all  Social Connections: Socially Integrated (09/19/2022)   Social Connection and Isolation Panel [NHANES]    Frequency of Communication with Friends and Family: More than three times a week    Frequency of Social Gatherings with Friends and Family: Twice a week    Attends Religious Services: More than 4 times per year    Active Member of [employer] or Organizations: Yes    Attends Engineer, structural: More than 4 times per year    Marital Status: Married    Tobacco Counseling Counseling given: Not Answered   Clinical Intake:  Pre-visit preparation completed: Yes  Pain : 0-10 Pain Score: 6  Pain Type: Chronic pain Pain Location: Back Pain Descriptors / Indicators: Aching     BMI - recorded: 24.8 Nutritional Status: BMI of 19-24  Normal Nutritional Risks: None Diabetes: Yes CBG done?: No (fbs 146 per patient this morning) Did pt. bring in CBG monitor from home?: No  How often do you need to have someone help you when you read instructions, pamphlets, or other written materials from  your doctor or pharmacy?: 1 - Never  Interpreter Needed?: No  Information entered by :: Tora Kindred, CMA   Activities  of Daily Living    09/19/2022    2:26 PM  In your present state of health, do you have any difficulty performing the following activities:  Hearing? 1  Comment wears hearing aids  Vision? 0  Difficulty concentrating or making decisions? 0  Walking or climbing stairs? 1  Comment uses a crutch, cane or walker  Dressing or bathing? 0  Doing errands, shopping? 0  Preparing Food and eating ? N  Using the Toilet? N  In the past six months, have you accidently leaked urine? N  Do you have problems with loss of bowel control? N  Managing your Medications? N  Managing your Finances? N  Housekeeping or managing your Housekeeping? N    Patient Care Team: Marjie Skiff, NP as PCP - General (Nurse Practitioner) Steele Sizer, MD as PCP - Family Medicine (Family Medicine) Iran Ouch, MD as PCP - Cardiology (Cardiology) Deirdre Evener, MD (Dermatology) Donalee Citrin, MD as Consulting Physician (Neurosurgery) Hooten, Illene Labrador, MD (Orthopedic Surgery) Callie Fielding, MD as Consulting Physician (Physical Medicine and Rehabilitation) Leonides Cave, MD as Referring Physician (Urology) Marlowe Sax, RN as Case Manager (General Practice) Earna Coder, MD as Consulting Physician (Oncology)  Indicate any recent Medical Services you may have received from other than [facility] providers in the past year (date may be approximate).     Assessment:   This is a routine wellness examination for [place].  Hearing/Vision screen Hearing Screening - Comments:: Wear hearing aids  Dietary issues and exercise activities discussed:     Goals Addressed               This Visit's Progress     Exercise 3x per week (30 min per time) (pt-stated)        Depression Screen    09/19/2022    2:35 PM 04/04/2022   11:18 AM 01/20/2022    3:51 PM 09/22/2021    8:58 AM  09/12/2021    9:19 AM 08/19/2021   11:28 AM 05/18/2021    3:47 PM  PHQ 2/9 Scores  PHQ - 2 Score 0 0 0 0 0  0  PHQ- 9 Score 0 2     3  Exception Documentation      Patient refusal     Fall Risk    09/19/2022    2:38 PM 07/03/2022   10:48 AM 04/04/2022   11:18 AM 01/20/2022    3:54 PM 09/22/2021    8:58 AM  Fall Risk   Falls in the past year? 0 0 0 1 0  Number falls in past yr: 0 0 0 1   Injury with Fall? 0 0 0 1   Risk for fall due to : Impaired mobility;Impaired balance/gait;Other (Comment) Impaired mobility;Impaired balance/gait Impaired balance/gait;Impaired mobility History of fall(s);Impaired balance/gait;Impaired vision   Risk for fall due to: Comment diabetic neuropathy      Follow up Falls prevention discussed Falls evaluation completed Falls evaluation completed Falls evaluation completed;Education provided;Falls prevention discussed     MEDICARE RISK AT HOME:  Medicare Risk at Home - 09/19/22 1439     Any stairs in or around the home? Yes    If so, are there any without handrails? No    Home free of loose throw rugs in walkways, pet beds, electrical cords, etc? Yes    Adequate lighting in your home to reduce risk of falls? Yes    Life alert? No    Use of a  cane, walker or w/c? Yes    Grab bars in the bathroom? Yes    Shower chair or bench in shower? Yes    Elevated toilet seat or a handicapped toilet? Yes             TIMED UP AND GO:  Was the test performed?  No    Cognitive Function:        09/19/2022    2:40 PM 09/12/2021    9:12 AM 09/10/2020    9:09 AM 09/08/2019    9:52 AM 01/20/2019   10:58 AM  6CIT Screen  What Year? 0 points 0 points 0 points 0 points 0 points  What month? 0 points 0 points 0 points 0 points 0 points  What time? 0 points 0 points 0 points 0 points 0 points  Count back from 20 0 points 0 points 0 points 0 points 0 points  Months in reverse 0 points 0 points 0 points 0 points 0 points  Repeat phrase 0 points 0 points 0 points 2 points 0  points  Total Score 0 points 0 points 0 points 2 points 0 points    Immunizations Immunization History  Administered Date(s) Administered   Fluad Quad(high Dose 65+) 10/25/2020   Influenza, High Dose Seasonal PF 11/16/2015, 12/05/2016   Influenza,inj,Quad PF,6+ Mos 12/01/2014   Influenza-Unspecified 11/05/2017, 11/14/2018   PFIZER(Purple Top)SARS-COV-2 Vaccination 02/24/2019, 03/17/2019, 12/16/2019   Pneumococcal Conjugate-13 09/03/2013   Pneumococcal Polysaccharide-23 06/02/2015   Rsv, Bivalent, Protein Subunit Rsvpref,pf Verdis Frederickson) 04/18/2022   Td 06/02/2015   Td (Adult), 2 Lf Tetanus Toxid, Preservative Free 06/02/2015   Tdap 06/04/2020   Zoster Recombinant(Shingrix) 08/28/2017, 12/06/2017, 01/01/2018   Zoster, Live 02/14/2011    TDAP status: Up to date  Flu Vaccine status: Due, Education has been provided regarding the importance of this vaccine. Advised may receive this vaccine at local pharmacy or Health Dept. Aware to provide a copy of the vaccination record if obtained from local pharmacy or Health Dept. Verbalized acceptance and understanding.  Pneumococcal vaccine status: Up to date  Covid-19 vaccine status: Declined, Education has been provided regarding the importance of this vaccine but patient still declined. Advised may receive this vaccine at local pharmacy or Health Dept.or vaccine clinic. Aware to provide a copy of the vaccination record if obtained from local pharmacy or Health Dept. Verbalized acceptance and understanding.  Qualifies for Shingles Vaccine? Yes   Zostavax completed No   Shingrix Completed?: Yes  Screening Tests Health Maintenance  Topic Date Due   COVID-19 Vaccine (4 - 2023-24 season) 10/14/2021   INFLUENZA VACCINE  09/14/2022   OPHTHALMOLOGY EXAM  09/20/2022   HEMOGLOBIN A1C  01/03/2023   Diabetic kidney evaluation - Urine ACR  04/05/2023   Diabetic kidney evaluation - eGFR measurement  07/03/2023   FOOT EXAM  07/03/2023   Medicare Annual  Wellness (AWV)  09/19/2023   DTaP/Tdap/Td (3 - Td or Tdap) 06/05/2030   Pneumonia Vaccine 1+ Years old  Completed   Zoster Vaccines- Shingrix  Completed   HPV VACCINES  Aged Out   Colonoscopy  Discontinued   Hepatitis C Screening  Discontinued    Health Maintenance  Health Maintenance Due  Topic Date Due   COVID-19 Vaccine (4 - 2023-24 season) 10/14/2021   INFLUENZA VACCINE  09/14/2022    Colorectal cancer screening: No longer required.   Lung Cancer Screening: (Low Dose CT Chest recommended if Age 26-80 years, 20 pack-year currently smoking OR have quit w/in 15years.) does not  qualify.   Lung Cancer Screening Referral: n/a  Additional Screening:  Hepatitis C Screening: does not qualify; Completed 02/26/20  Vision Screening: Recommended annual ophthalmology exams for early detection of glaucoma and other disorders of the eye. Is the patient up to date with their annual eye exam?  Yes  Who is the provider or what is the name of the office in which the patient attends annual eye exams? Dr. Meredeth Ide, [place] If pt is not established with a provider, would they like to be referred to a provider to establish care? No .   Dental Screening: Recommended annual dental exams for proper oral hygiene  Diabetic Foot Exam: Diabetic Foot Exam: Completed 07/03/22  Community Resource Referral / Chronic Care Management: CRR required this visit?  No   CCM required this visit?  No     Plan:     I have personally reviewed and noted the following in the patient's chart:   Medical and social history Use of alcohol, tobacco or illicit drugs  Current medications and supplements including opioid prescriptions. Patient is not currently taking opioid prescriptions. Functional ability and status Nutritional status Physical activity Advanced directives List of other physicians Hospitalizations, surgeries, and ER visits in previous 12 months Vitals Screenings to include cognitive,  depression, and falls Referrals and appointments  In addition, I have reviewed and discussed with patient certain preventive protocols, quality metrics, and best practice recommendations. A written personalized care plan for preventive services as well as general preventive health recommendations were provided to patient.     Tora Kindred, CMA   09/19/2022   After Visit Summary: (MyChart) Due to this being a telephonic visit, the after visit summary with patients personalized plan was offered to patient via MyChart   Nurse Notes:  Referral placed to DM/Nutrition education Patient refused covid vaccine Patient will call to schedule eye exam since it is due this month.

## 2022-09-21 ENCOUNTER — Ambulatory Visit
Payer: Medicare Other | Attending: Student in an Organized Health Care Education/Training Program | Admitting: Student in an Organized Health Care Education/Training Program

## 2022-09-21 DIAGNOSIS — Z981 Arthrodesis status: Secondary | ICD-10-CM | POA: Diagnosis not present

## 2022-09-21 DIAGNOSIS — M48062 Spinal stenosis, lumbar region with neurogenic claudication: Secondary | ICD-10-CM

## 2022-09-21 DIAGNOSIS — M961 Postlaminectomy syndrome, not elsewhere classified: Secondary | ICD-10-CM

## 2022-09-21 DIAGNOSIS — G8929 Other chronic pain: Secondary | ICD-10-CM

## 2022-09-21 DIAGNOSIS — Z9689 Presence of other specified functional implants: Secondary | ICD-10-CM | POA: Diagnosis not present

## 2022-09-21 DIAGNOSIS — M5416 Radiculopathy, lumbar region: Secondary | ICD-10-CM

## 2022-09-21 DIAGNOSIS — E1140 Type 2 diabetes mellitus with diabetic neuropathy, unspecified: Secondary | ICD-10-CM

## 2022-09-21 NOTE — Progress Notes (Signed)
Patient: Sean Reyes.  Service Category: E/M  Provider: Edward Jolly, MD  DOB: Jun 14, 1940  DOS: 09/21/2022  Location: Office  MRN: [id number]  Setting: Ambulatory outpatient  Referring Provider: Marjie Skiff, NP  Type: Established Patient  Specialty: Interventional Pain Management  PCP: Marjie Skiff, NP  Location: Remote location  Delivery: TeleHealth     Virtual Encounter - Pain Management PROVIDER NOTE: Information contained herein reflects review and annotations entered in association with encounter. Interpretation of such information and data should be left to medically-trained personnel. Information provided to patient can be located elsewhere in the medical record under "Patient Instructions". Document created using STT-dictation technology, any transcriptional errors that may result from process are unintentional.    Contact & Pharmacy Preferred: [phone] Home: [phone] (home) Mobile: [phone] (mobile) E-mail: [email or web address]  [facility] - [place], [place] - [address] [place] [address] Phone: [phone] Fax: [phone]   Pre-screening  Mr. Houtz offered "in-person" vs "virtual" encounter. He indicated preferring virtual for this encounter.   Reason COVID-19*  Social distancing based on CDC and AMA recommendations.   I contacted Sean Reyes. on 09/21/2022 via telephone.      I clearly identified myself as Edward Jolly, MD. I verified that I was speaking with the correct person using two identifiers (Name: Yaya Monley., and date of birth: 01-29-1941).  Consent I sought verbal advanced consent from Sean Reyes. for virtual visit interactions. I informed Mr. Lepine of possible security and privacy concerns, risks, and limitations associated with providing "not-in-person" medical evaluation and management services. I also informed Mr. Babin of the availability of "in-person" appointments. Finally, I informed him that  there would be a charge for the virtual visit and that he could be  personally, fully or partially, financially responsible for it. Mr. Valley expressed understanding and agreed to proceed.   Historic Elements   Mr. Gaddis Koser. is a 82 y.o. year old, male patient evaluated today after our last contact on Visit date not found. Mr. Chakrabarti  has a past medical history of Anemia, Anxiety, Arthritis, Arthritis of neck, Atrial fibrillation (HCC), Cataracts, bilateral, Complication of anesthesia, Depression, Diabetes (HCC), Dysrhythmia, GERD (gastroesophageal reflux disease), History of kidney stones, HOH (hard of hearing), Hyperlipidemia, Hypertension, Myocardial infarction (HCC) (12/2020), Nocturia, S/P ablation of atrial fibrillation, Sleep apnea, Spinal stenosis, and Tachycardia, unspecified. He also  has a past surgical history that includes Tonsillectomy; Hernia repair; Ablation; Colonoscopy with propofol (N/A, 10/05/2015); Lumbar laminectomy/decompression microdiscectomy (Left, 09/13/2016); Anterior lat lumbar fusion (N/A, 06/27/2017); Joint replacement (Bilateral); Back surgery; Esophagogastroduodenoscopy (egd) with propofol (N/A, 04/01/2018); Cardioversion (N/A, 08/29/2018); Cardioversion (N/A, 09/24/2018); Spinal cord stimulator insertion (07/08/2019); Colonoscopy with propofol (N/A, 11/01/2020); Esophagogastroduodenoscopy (N/A, 11/01/2020); LEFT HEART CATH AND CORONARY ANGIOGRAPHY (N/A, 12/22/2020); CORONARY STENT INTERVENTION (N/A, 12/22/2020); LEFT HEART CATH AND CORONARY ANGIOGRAPHY (Left, 08/23/2021); Cardiac catheterization; Eye surgery; and Esophagogastroduodenoscopy (egd) with propofol (N/A, 06/01/2022). Mr. Hye has a current medication list which includes the following prescription(s): anoro ellipta, cyanocobalamin, eliquis, ferrous sulfate, finasteride, isosorbide mononitrate, metformin, omeprazole, ibgard, rosuvastatin, solifenacin, lidocaine, and metoprolol succinate. He  reports that he has  never smoked. He has never used smokeless tobacco. He reports that he does not drink alcohol and does not use drugs. Mr. Bertagnolli is allergic to levaquin [levofloxacin in d5w], shellfish allergy, amiodarone, and adhesive [tape].  BMI: Estimated body mass index is 24.8 kg/m as calculated from the following:   Height as of 09/19/22: 6'  1" (1.854 m).   Weight as of 09/19/22: 188 lb (85.3 kg). Last encounter: 09/22/2021. Last procedure: Visit date not found.  HPI  Today, he is being contacted for worsening of previously known (established) problem  Patient has a L2-S1 lumbar fusion.  He also has a spinal cord stimulator in place.  He states that the spinal cord stimulator is somewhat helpful.  He is complaining of significant weakness in his legs and difficulty walking.  This is related to chronic neuropathy.  He also endorses intermittent paresthesias of bilateral feet.  He has mild buttock pain and SI joint pain.  His most recent CT lumbar spine did show bilateral SI joint arthritis.  We discussed a SI joint injection but the patient wants to hold off on that.  I also offered him Qutenza for painful diabetic neuropathy.  He states that he will think about that further and let us know.  We discussed the importance of physical therapy and core strengthening.  He states that he has not been going to [employer] recently and believes that could also be contributing to his reduced walking and standing distance.  I offered him a referral to physical therapy.  He states that he will try to get back into the gym but will contact me if he would like to consider an SI joint injection, Qutenza for referral to physical therapy.    Laboratory Chemistry Profile   Renal Lab Results  Component Value Date   BUN 20 07/03/2022   CREATININE 1.23 07/03/2022   BCR 16 07/03/2022   GFRAA 64 02/26/2020   GFRNONAA >60 05/25/2022    Hepatic Lab Results  Component Value Date   AST 20 04/12/2022   ALT 15 04/12/2022    ALBUMIN 3.9 04/12/2022   ALKPHOS 56 04/12/2022   LIPASE 29 05/25/2022    Electrolytes Lab Results  Component Value Date   NA 134 07/03/2022   K 4.3 07/03/2022   CL 96 07/03/2022   CALCIUM 9.6 07/03/2022   MG 1.6 01/02/2022    Bone Lab Results  Component Value Date   VD25OH 39.8 04/25/2021   TESTOFREE 5.6 (L) 06/17/2021   TESTOSTERONE 400 06/17/2021    Inflammation (CRP: Acute Phase) (ESR: Chronic Phase) No results found for: "CRP", "ESRSEDRATE", "LATICACIDVEN"       Note: Above Lab results reviewed.  Imaging  CT LUMBAR SPINE WO CONTRAST CLINICAL DATA:  Low back pain. Prior surgery. New symptoms. Severe bilateral leg weakness.  EXAM: CT LUMBAR SPINE WITHOUT CONTRAST  TECHNIQUE: Multidetector CT imaging of the lumbar spine was performed without intravenous contrast administration. Multiplanar CT image reconstructions were also generated.  RADIATION DOSE REDUCTION: This exam was performed according to the departmental dose-optimization program which includes automated exposure control, adjustment of the mA and/or kV according to patient size and/or use of iterative reconstruction technique.  COMPARISON:  Or radiography 06/20/2022. MRI 06/23/2021. CT 06/23/2021.  FINDINGS: Segmentation: 5 lumbar type vertebral bodies as numbered previously.  Alignment: No malalignment, except for 1-2 mm of degenerative retrolisthesis at T11-12 as seen previously.  Vertebrae: Solid fusion across the T12-L1 disc space. Large anterior osteophytes at L1-2 but possibly without definite solid bridging. This may remain a mobile level. Solid union postsurgical from L2 to the sacrum.  Paraspinal and other soft tissues: No acute finding. Aortic atherosclerosis.  Disc levels: T11 12: 2 mm of retrolisthesis. Disc degeneration with vacuum phenomenon. Neurostimulator in place within the dorsal spinal canal. Mild bulging of the disc. Mild canal narrowing  but no likely neural  compression.  T12-L1: Solid bridging anterior osteophytes. Wide patency of the canal and foramina. Neurostimulator enters at this level.  L1-2: Prominent anterior osteophytes but probably without solid bridging. Bulging of the disc. Mild facet and ligamentous hypertrophy. Mild narrowing of the lateral recesses but no likely neural compression.  L2 to sacrum: Previous discectomy and fusion procedures. Solid union throughout the region. Sufficient patency of the central canal. Some chronic bony foraminal narrowing on the right at L4-5 and L5-S1, not likely compressive.  Bilateral sacroiliac osteoarthritis is noted.  IMPRESSION: 1. No acute finding by CT. Very similar appearance to the prior CT study of 06/23/2021. 2. Solid fusion from L2 to the sacrum. Sufficient patency of the canal and foramina. 3. T11-12: 2 mm of retrolisthesis. Disc degeneration with vacuum phenomenon. Mild bulging of the disc. Mild canal narrowing but no likely neural compression. Findings could relate to regional pain. 4. L1-2: Prominent anterior osteophytes but probably without solid bridging. Bulging of the disc. Mild facet and ligamentous hypertrophy. Mild narrowing of the lateral recesses but no likely neural compression. 5. Bilateral sacroiliac osteoarthritis. 6. Aortic atherosclerosis.  Aortic Atherosclerosis (ICD10-I70.0).  Electronically Signed   By: Paulina Fusi M.D.   On: 08/07/2022 11:38  Assessment  The primary encounter diagnosis was Failed back surgical syndrome. Diagnoses of History of fusion of lumbar spine (L2-L5), Spinal stenosis, lumbar region, with neurogenic claudication, Spinal cord stimulator status, Chronic radicular lumbar pain, and Chronic painful diabetic neuropathy (HCC) were also pertinent to this visit.  Plan of Care  Patient states that he will try to return back to the gym. Continue with spinal cord stimulation. Consider bilateral SI joint injection given CT findings of  bilateral SI joint arthritis and mild intermittent buttock and SI joint pain Consider Qutenza for chronic painful diabetic neuropathic pain of bilateral feet  Follow-up plan:   Return for patient will call to schedule F2F appt prn.      Status post caudal 02/03/2019: 6 cc injected- not helpful, status post Nevro spinal cord stimulator implant with Dr. Claudette Laws at [facility]           Recent Visits No visits were found meeting these conditions. Showing recent visits within past 90 days and meeting all other requirements Today's Visits Date Type Provider Dept  09/21/22 Office Visit Edward Jolly, MD [facility]  Showing today's visits and meeting all other requirements Future Appointments No visits were found meeting these conditions. Showing future appointments within next 90 days and meeting all other requirements  I discussed the assessment and treatment plan with the patient. The patient was provided an opportunity to ask questions and all were answered. The patient agreed with the plan and demonstrated an understanding of the instructions.  Patient advised to call back or seek an in-person evaluation if the symptoms or condition worsens.  Duration of encounter: .  Note by: Edward Jolly, MD Date: 09/21/2022; Time: 12:12 PM

## 2022-09-26 ENCOUNTER — Ambulatory Visit: Payer: Medicare Other | Attending: Cardiovascular Disease | Admitting: Cardiovascular Disease

## 2022-09-26 ENCOUNTER — Encounter: Payer: Self-pay | Admitting: Cardiovascular Disease

## 2022-09-26 VITALS — BP 138/70 | HR 63 | Ht 73.0 in | Wt 186.1 lb

## 2022-09-26 DIAGNOSIS — I480 Paroxysmal atrial fibrillation: Secondary | ICD-10-CM | POA: Insufficient documentation

## 2022-09-26 DIAGNOSIS — I739 Peripheral vascular disease, unspecified: Secondary | ICD-10-CM | POA: Diagnosis not present

## 2022-09-26 DIAGNOSIS — I2510 Atherosclerotic heart disease of native coronary artery without angina pectoris: Secondary | ICD-10-CM | POA: Diagnosis not present

## 2022-09-26 DIAGNOSIS — E785 Hyperlipidemia, unspecified: Secondary | ICD-10-CM | POA: Insufficient documentation

## 2022-09-26 DIAGNOSIS — I10 Essential (primary) hypertension: Secondary | ICD-10-CM | POA: Insufficient documentation

## 2022-09-26 NOTE — Patient Instructions (Signed)
Medication Instructions:  No changes *If you need a refill on your cardiac medications before your next appointment, please call your pharmacy*   Lab Work: None ordered If you have labs (blood work) drawn today and your tests are completely normal, you will receive your results only by: MyChart Message (if you have MyChart) OR A paper copy in the mail If you have any lab test that is abnormal or we need to change your treatment, we will call you to review the results.   Testing/Procedures: None ordered   Follow-Up: At [facility], you and your health needs are our priority.  As part of our continuing mission to provide you with exceptional heart care, we have created designated Provider Care Teams.  These Care Teams include your primary Cardiologist (physician) and Advanced Practice Providers (APPs -  Physician Assistants and Nurse Practitioners) who all work together to provide you with the care you need, when you need it.  We recommend signing up for the patient portal called "MyChart".  Sign up information is provided on this After Visit Summary.  MyChart is used to connect with patients for Virtual Visits (Telemedicine).  Patients are able to view lab/test results, encounter notes, upcoming appointments, etc.  Non-urgent messages can be sent to your provider as well.   To learn more about what you can do with MyChart, go to [email or web address].    Your next appointment:   6 month(s)  Provider:   You may see Muhammad Arida, MD or one of the following Advanced Practice Providers on your designated Care Team:   Christopher Berge, NP Ryan Dunn, PA-C Cadence Furth, PA-C Sheri Hammock, NP    

## 2022-09-26 NOTE — Progress Notes (Unsigned)
Cardiology Office Note   Date:  09/26/2022   ID:  [name], DOB 07-Nov-1940, MRN [id number]  PCP:  Marjie Skiff, NP  Cardiologist:   Lorine Bears, MD   Chief Complaint  Patient presents with   Follow-up    6 month f/u c/o sob and heart rhythm. Meds reviewed verbally with pt.      History of Present Illness: Sean Reyes. is a 82 y.o. male who presents for a follow-up visit regarding paroxysmal atrial fibrillation, PVCs and coronary artery disease.   Other medical problems include essential hypertension, hyperlipidemia, type 2 diabetes and chronic kidney disease. The patient is severely limited by back pain which has been chronic.  He had 3 back surgeries He presented in November 2022 with non-ST elevation myocardial infarction.  Cardiac catheterization was done which showed severe proximal RCA stenosis which was treated successfully with PCI and drug-eluting stent placement.  He had repeat cardiac catheterization in July 2023 which showed patent RCA stent with no significant restenosis.  There was borderline 60% stenosis in the distal LAD and mild distal left circumflex disease.    He has known history of paroxysmal atrial fibrillation for many years.  He underwent an ablation procedure at [facility] many years ago and reports having a complication during the procedure that necessitated aborting the procedure.  He subsequently had ablation at [facility] by Dr. Maisie Fus with improvement.   Upon his initial evaluation with me in July 2023, he was on propafenone which was discontinued given his coronary artery disease.  I started him on small dose Toprol instead.  He had bradycardia on Toprol and the dose was decreased to 12.5 mg once daily.  In spite of that, he continued to have symptomatic bradycardia with heart rate in the 40s and feeling sluggish.  Thus, I discontinued the medication.  Since that time, he did have more palpitations especially with activities.  No chest pain.  She is  limited by chronic exertional dyspnea and COPD as well as low back pain.  Past Medical History:  Diagnosis Date   Anemia    Anxiety    Arthritis    Arthritis of neck    Atrial fibrillation (HCC)    Cataracts, bilateral    Complication of anesthesia    pt reports low BP's after surgery at [facility] and difficulty awakening   Depression    Diabetes (HCC)    dx 6-8 yrs ago   Dysrhythmia    a-fib   GERD (gastroesophageal reflux disease)    OCC TAKES ALKA SELTZER   History of kidney stones    10-15 yrs ago   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   Hyperlipidemia    Hypertension    Myocardial infarction (HCC) 12/2020   Nocturia    S/P ablation of atrial fibrillation    Ablative therapy   Sleep apnea    CPAP    Spinal stenosis    Tachycardia, unspecified     Past Surgical History:  Procedure Laterality Date   ABLATION     ANTERIOR LAT LUMBAR FUSION N/A 06/27/2017   Procedure: Anterior Lateral Lumbar Interbody  Fusion - Lumbar Two-Lumbar Three - Lumbar Three-Lumbar Four, Posterior Lumbar Interbody Fusion Lumbar Four- Five;  Surgeon: Donalee Citrin, MD;  Location: [facility] OR;  Service: Neurosurgery;  Laterality: N/A;  Anterior Lateral Lumbar Interbody  Fusion - Lumbar Two-Lumbar Three - Lumbar Three-Lumbar Four, Posterior Lumbar Interbody Fusion Lumbar Four- Five  BACK SURGERY     CARDIAC CATHETERIZATION     CARDIOVERSION N/A 08/29/2018   Procedure: CARDIOVERSION;  Surgeon: Lamar Blinks, MD;  Location: [facility] ORS;  Service: Cardiovascular;  Laterality: N/A;   CARDIOVERSION N/A 09/24/2018   Procedure: CARDIOVERSION;  Surgeon: Lamar Blinks, MD;  Location: [facility] ORS;  Service: Cardiovascular;  Laterality: N/A;   COLONOSCOPY WITH PROPOFOL N/A 10/05/2015   Procedure: COLONOSCOPY WITH PROPOFOL;  Surgeon: Christena Deem, MD;  Location[facility] ENDOSCOPY;  Service: Endoscopy;  Laterality: N/A;   COLONOSCOPY WITH PROPOFOL N/A 11/01/2020   Procedure: COLONOSCOPY WITH  PROPOFOL;  Surgeon: Wyline Mood, MD;  Location[facility] ENDOSCOPY;  Service: Gastroenterology;  Laterality: N/A;   CORONARY STENT INTERVENTION N/A 12/22/2020   Procedure: CORONARY STENT INTERVENTION;  Surgeon: Marcina Millard, MD;  Location: [facility] INVASIVE CV LAB;  Service: Cardiovascular;  Laterality: N/A;   ESOPHAGOGASTRODUODENOSCOPY N/A 11/01/2020   Procedure: ESOPHAGOGASTRODUODENOSCOPY (EGD);  Surgeon: Wyline Mood, MD;  Location: [facility] ENDOSCOPY;  Service: Gastroenterology;  Laterality: N/A;   ESOPHAGOGASTRODUODENOSCOPY (EGD) WITH PROPOFOL N/A 04/01/2018   Procedure: ESOPHAGOGASTRODUODENOSCOPY (EGD) WITH PROPOFOL;  Surgeon: Christena Deem, MD;  Location: [facility] ENDOSCOPY;  Service: Endoscopy;  Laterality: N/A;   ESOPHAGOGASTRODUODENOSCOPY (EGD) WITH PROPOFOL N/A 06/01/2022   Procedure: ESOPHAGOGASTRODUODENOSCOPY (EGD) WITH PROPOFOL;  Surgeon: Wyline Mood, MD;  Location: [facility] ENDOSCOPY;  Service: Gastroenterology;  Laterality: N/A;   EYE SURGERY     HERNIA REPAIR     JOINT REPLACEMENT Bilateral    hips  RT+  LEFT X2    LEFT HEART CATH AND CORONARY ANGIOGRAPHY N/A 12/22/2020   Procedure: LEFT HEART CATH AND CORONARY ANGIOGRAPHY;  Surgeon: Marcina Millard, MD;  Location: [facility] INVASIVE CV LAB;  Service: Cardiovascular;  Laterality: N/A;   LEFT HEART CATH AND CORONARY ANGIOGRAPHY Left 08/23/2021   Procedure: LEFT HEART CATH AND CORONARY ANGIOGRAPHY;  Surgeon: Lamar Blinks, MD;  Location: [facility] INVASIVE CV LAB;  Service: Cardiovascular;  Laterality: Left;   LUMBAR LAMINECTOMY/DECOMPRESSION MICRODISCECTOMY Left 09/13/2016   Procedure: Microdiscectomy - Lumbar two-three,  Lumbar three- - left;  Surgeon: Donalee Citrin, MD;  Location: [facility] OR;  Service: Neurosurgery;  Laterality: Left;   SPINAL CORD STIMULATOR INSERTION  07/08/2019   TONSILLECTOMY       Current Outpatient Medications  Medication Sig Dispense Refill   ANORO ELLIPTA 62.5-25 MCG/ACT AEPB Inhale 1 puff into the lungs daily.      cyanocobalamin 1000 MCG tablet Take by mouth.     ELIQUIS 5 MG TABS tablet Take 1 tablet (5 mg total) by mouth 2 (two) times daily. 180 tablet 4   ferrous sulfate 325 (65 FE) MG EC tablet Take 325 mg by mouth daily with breakfast.     finasteride (PROSCAR) 5 MG tablet Take 1 tablet (5 mg total) by mouth daily. 90 tablet 3   isosorbide mononitrate (IMDUR) 30 MG 24 hr tablet Take 30 mg by mouth daily.     metFORMIN (GLUCOPHAGE) 500 MG tablet Take 1 tablet (500 mg total) by mouth 2 (two) times daily with a meal. 180 tablet 4   omeprazole (PRILOSEC) 40 MG capsule Take 1 capsule (40 mg total) by mouth in the morning and at bedtime. 180 capsule 3   Peppermint Oil (IBGARD) 90 MG CPCR Take 2 capsules by mouth 2 (two) times daily.     rosuvastatin (CRESTOR) 5 MG tablet Take 1 tablet (5 mg total) by mouth daily. 90 tablet 4   solifenacin (VESICARE) 5 MG tablet Take 1 tablet (5 mg total)  by mouth daily. 90 tablet 3   lidocaine (LIDODERM) 5 % Place 1 patch onto the skin daily. Remove & Discard patch within 12 hours or as directed by MD (Patient not taking: Reported on 09/26/2022) 30 patch 2   metoprolol succinate (TOPROL XL) 25 MG 24 hr tablet Take 0.5 tablets (12.5 mg total) by mouth daily. (Patient not taking: Reported on 09/26/2022) 45 tablet 1   No current facility-administered medications for this visit.    Allergies:   Levaquin [levofloxacin in d5w], Shellfish allergy, Amiodarone, and Adhesive [tape]    Social History:  The patient  reports that he has never smoked. He has never used smokeless tobacco. He reports that he does not drink alcohol and does not use drugs.   Family History:  The patient's family history includes Brain cancer in his mother; Other in his father.    ROS:  Please see the history of present illness.   Otherwise, review of systems are positive for none.   All other systems are reviewed and negative.    PHYSICAL EXAM: VS:  BP 138/70 (BP Location: Left Arm, Patient Position:  Sitting, Cuff Size: Normal)   Pulse 63   Ht 6\' 1"  (1.854 m)   Wt 186 lb 2 oz (84.4 kg)   SpO2 98%   BMI 24.56 kg/m  , BMI Body mass index is 24.56 kg/m. GEN: Well nourished, well developed, in no acute distress  HEENT: normal  Neck: no JVD, carotid bruits, or masses Cardiac: RRR; no murmurs, rubs, or gallops,no edema  Respiratory:  clear to auscultation bilaterally, normal work of breathing GI: soft, nontender, nondistended, + BS MS: no deformity or atrophy  Skin: warm and dry, no rash Neuro:  Strength and sensation are intact Psych: euthymic mood, full affect Distal pulses are palpable.  Femoral pulses are +2.   EKG:  EKG is ordered today. The ekg ordered today demonstrates : Sinus rhythm with sinus arrhythmia with 1st degree A-V block When compared with ECG of 25-May-2022 20:14, PACs are less    Recent Labs: 10/07/2021: B Natriuretic Peptide 74.1 01/02/2022: Magnesium 1.6 04/12/2022: ALT 15 05/25/2022: Hemoglobin 12.6; Platelets 155 07/03/2022: BUN 20; Creatinine, Ser 1.23; Potassium 4.3; Sodium 134; TSH 3.660    Lipid Panel    Component Value Date/Time   CHOL 95 (L) 04/04/2022 1118   TRIG 82 04/04/2022 1118   HDL 43 04/04/2022 1118   CHOLHDL 2.6 12/21/2020 0652   VLDL 13 12/21/2020 0652   LDLCALC 35 04/04/2022 1118      Wt Readings from Last 3 Encounters:  09/26/22 186 lb 2 oz (84.4 kg)  09/19/22 188 lb (85.3 kg)  09/11/22 184 lb 6.4 oz (83.6 kg)          09/08/2021    8:39 AM  PAD Screen  Previous PAD dx? No  Previous surgical procedure? Yes  Pain with walking? No  Feet/toe relief with dangling? No  Painful, non-healing ulcers? No  Extremities discolored? No      ASSESSMENT AND PLAN:  1.  Coronary artery disease involving native coronary arteries: Status post myocardial infarction and stent placement to the right coronary artery in November 2022.   He does have borderline disease in the distal LAD but the vessel is too small in that area.  No  antiplatelet medication given that he is on Eliquis.  2.  Paroxysmal atrial fibrillation: He is status post ablation and is maintaining in sinus rhythm.  He had symptomatic bradycardia that improved after stopping  Toprol.  Continue anticoagulation with Eliquis.   He does report exertional palpitations when he is having hard time breathing but I reassured him that there has been no evidence of recurrent atrial fibrillation.  He is known to have frequent PACs.  3.  Essential hypertension: Blood pressures controlled on current medications.    4.  Hyperlipidemia: Continue rosuvastatin with a target LDL of less than 70.I reviewed his recent lipid profile which showed an LDL of 35.  5.  Type 2 diabetes: Consider an SGLT2 inhibitor given his cardiac history.  6.  Exertional dyspnea: Likely due to underlying COPD and physical deconditioning.    Disposition:   FU with me in 6 months  Signed,  Lorine Bears, MD  09/26/2022 3:53 PM    [facility]

## 2022-10-01 NOTE — Patient Instructions (Incomplete)
 Be Involved in Caring For Your Health:  Taking Medications When medications are taken as directed, they can greatly improve your health. But if they are not taken as prescribed, they may not work. In some cases, not taking them correctly can be harmful. To help ensure your treatment remains effective and safe, understand your medications and how to take them. Bring your medications to each visit for review by your provider.  Your lab results, notes, and after visit summary will be available on My Chart. We strongly encourage you to use this feature. If lab results are abnormal the clinic will contact you with the appropriate steps. If the clinic does not contact you assume the results are satisfactory. You can always view your results on My Chart. If you have questions regarding your health or results, please contact the clinic during office hours. You can also ask questions on My Chart.  We at [facility] are grateful that you chose [place] to provide your care. We strive to provide evidence-based and compassionate care and are always looking for feedback. If you get a survey from the clinic please complete this so we can hear your opinions.  Diabetes Mellitus and Nutrition, Adult When you have diabetes, or diabetes mellitus, it is very important to have healthy eating habits because your blood sugar (glucose) levels are greatly affected by what you eat and drink. Eating healthy foods in the right amounts, at about the same times every day, can help you: Manage your blood glucose. Lower your risk of heart disease. Improve your blood pressure. Reach or maintain a healthy weight. What can affect my meal plan? Every person with diabetes is different, and each person has different needs for a meal plan. Your health care provider may recommend that you work with a dietitian to make a meal plan that is best for you. Your meal plan may vary depending on factors such as: The calories you need. The  medicines you take. Your weight. Your blood glucose, blood pressure, and cholesterol levels. Your activity level. Other health conditions you have, such as heart or kidney disease. How do carbohydrates affect me? Carbohydrates, also called carbs, affect your blood glucose level more than any other type of food. Eating carbs raises the amount of glucose in your blood. It is important to know how many carbs you can safely have in each meal. This is different for every person. Your dietitian can help you calculate how many carbs you should have at each meal and for each snack. How does alcohol affect me? Alcohol can cause a decrease in blood glucose (hypoglycemia), especially if you use insulin or take certain diabetes medicines by mouth. Hypoglycemia can be a life-threatening condition. Symptoms of hypoglycemia, such as sleepiness, dizziness, and confusion, are similar to symptoms of having too much alcohol. Do not drink alcohol if: Your health care provider tells you not to drink. You are pregnant, may be pregnant, or are planning to become pregnant. If you drink alcohol: Limit how much you have to: 0-1 drink a day for women. 0-2 drinks a day for men. Know how much alcohol is in your drink. In the [place] one drink equals one 12 oz bottle of beer (355 mL), one 5 oz glass of wine (148 mL), or one 1 oz glass of hard liquor (44 mL). Keep yourself hydrated with water, diet soda, or unsweetened iced tea. Keep in mind that regular soda, juice, and other mixers may contain a lot of sugar and must  be counted as carbs. What are tips for following this plan?  Reading food labels Start by checking the serving size on the Nutrition Facts label of packaged foods and drinks. The number of calories and the amount of carbs, fats, and other nutrients listed on the label are based on one serving of the item. Many items contain more than one serving per package. Check the total grams (g) of carbs in one  serving. Check the number of grams of saturated fats and trans fats in one serving. Choose foods that have a low amount or none of these fats. Check the number of milligrams (mg) of salt (sodium) in one serving. Most people should limit total sodium intake to less than 2,300 mg per day. Always check the nutrition information of foods labeled as "low-fat" or "nonfat." These foods may be higher in added sugar or refined carbs and should be avoided. Talk to your dietitian to identify your daily goals for nutrients listed on the label. Shopping Avoid buying canned, pre-made, or processed foods. These foods tend to be high in fat, sodium, and added sugar. Shop around the outside edge of the grocery store. This is where you will most often find fresh fruits and vegetables, bulk grains, fresh meats, and fresh dairy products. Cooking Use low-heat cooking methods, such as baking, instead of high-heat cooking methods, such as deep frying. Cook using healthy oils, such as olive, canola, or sunflower oil. Avoid cooking with butter, cream, or high-fat meats. Meal planning Eat meals and snacks regularly, preferably at the same times every day. Avoid going long periods of time without eating. Eat foods that are high in fiber, such as fresh fruits, vegetables, beans, and whole grains. Eat 4-6 oz (112-168 g) of lean protein each day, such as lean meat, chicken, fish, eggs, or tofu. One ounce (oz) (28 g) of lean protein is equal to: 1 oz (28 g) of meat, chicken, or fish. 1 egg.  cup (62 g) of tofu. Eat some foods each day that contain healthy fats, such as avocado, nuts, seeds, and fish. What foods should I eat? Fruits Berries. Apples. Oranges. Peaches. Apricots. Plums. Grapes. Mangoes. Papayas. Pomegranates. Kiwi. Cherries. Vegetables Leafy greens, including lettuce, spinach, kale, chard, collard greens, mustard greens, and cabbage. Beets. Cauliflower. Broccoli. Carrots. Green beans. Tomatoes. Peppers.  Onions. Cucumbers. Brussels sprouts. Grains Whole grains, such as whole-wheat or whole-grain bread, crackers, tortillas, cereal, and pasta. Unsweetened oatmeal. Quinoa. Brown or wild rice. Meats and other proteins Seafood. Poultry without skin. Lean cuts of poultry and beef. Tofu. Nuts. Seeds. Dairy Low-fat or fat-free dairy products such as milk, yogurt, and cheese. The items listed above may not be a complete list of foods and beverages you can eat and drink. Contact a dietitian for more information. What foods should I avoid? Fruits Fruits canned with syrup. Vegetables Canned vegetables. Frozen vegetables with butter or cream sauce. Grains Refined white flour and flour products such as bread, pasta, snack foods, and cereals. Avoid all processed foods. Meats and other proteins Fatty cuts of meat. Poultry with skin. Breaded or fried meats. Processed meat. Avoid saturated fats. Dairy Full-fat yogurt, cheese, or milk. Beverages Sweetened drinks, such as soda or iced tea. The items listed above may not be a complete list of foods and beverages you should avoid. Contact a dietitian for more information. Questions to ask a health care provider Do I need to meet with a certified diabetes care and education specialist? Do I need to meet with a  dietitian? What number can I call if I have questions? When are the best times to check my blood glucose? Where to find more information: American Diabetes Association: diabetes.org Academy of Nutrition and Dietetics: eatright.[employer] of Diabetes and Digestive and Kidney Diseases: StageSync.si Association of Diabetes Care & Education Specialists: diabeteseducator.org Summary It is important to have healthy eating habits because your blood sugar (glucose) levels are greatly affected by what you eat and drink. It is important to use alcohol carefully. A healthy meal plan will help you manage your blood glucose and lower your risk of  heart disease. Your health care provider may recommend that you work with a dietitian to make a meal plan that is best for you. This information is not intended to replace advice given to you by your health care provider. Make sure you discuss any questions you have with your health care provider. Document Revised: 09/03/2019 Document Reviewed: 09/03/2019 Elsevier Patient Education  2024 [employer].

## 2022-10-03 ENCOUNTER — Ambulatory Visit (INDEPENDENT_AMBULATORY_CARE_PROVIDER_SITE_OTHER): Payer: Medicare Other | Admitting: Nurse Practitioner

## 2022-10-03 ENCOUNTER — Encounter: Payer: Self-pay | Admitting: Nurse Practitioner

## 2022-10-03 ENCOUNTER — Other Ambulatory Visit: Payer: Self-pay | Admitting: Nurse Practitioner

## 2022-10-03 VITALS — BP 138/57 | HR 48 | Temp 97.8°F | Ht 73.0 in | Wt 185.8 lb

## 2022-10-03 DIAGNOSIS — N6342 Unspecified lump in left breast, subareolar: Secondary | ICD-10-CM

## 2022-10-03 DIAGNOSIS — E1143 Type 2 diabetes mellitus with diabetic autonomic (poly)neuropathy: Secondary | ICD-10-CM

## 2022-10-03 DIAGNOSIS — E1159 Type 2 diabetes mellitus with other circulatory complications: Secondary | ICD-10-CM

## 2022-10-03 DIAGNOSIS — J449 Chronic obstructive pulmonary disease, unspecified: Secondary | ICD-10-CM

## 2022-10-03 DIAGNOSIS — E1129 Type 2 diabetes mellitus with other diabetic kidney complication: Secondary | ICD-10-CM | POA: Diagnosis not present

## 2022-10-03 DIAGNOSIS — R809 Proteinuria, unspecified: Secondary | ICD-10-CM | POA: Diagnosis not present

## 2022-10-03 DIAGNOSIS — D6869 Other thrombophilia: Secondary | ICD-10-CM | POA: Diagnosis not present

## 2022-10-03 DIAGNOSIS — I25110 Atherosclerotic heart disease of native coronary artery with unstable angina pectoris: Secondary | ICD-10-CM | POA: Diagnosis not present

## 2022-10-03 DIAGNOSIS — E785 Hyperlipidemia, unspecified: Secondary | ICD-10-CM | POA: Diagnosis not present

## 2022-10-03 DIAGNOSIS — E1169 Type 2 diabetes mellitus with other specified complication: Secondary | ICD-10-CM | POA: Diagnosis not present

## 2022-10-03 DIAGNOSIS — G4733 Obstructive sleep apnea (adult) (pediatric): Secondary | ICD-10-CM

## 2022-10-03 DIAGNOSIS — I739 Peripheral vascular disease, unspecified: Secondary | ICD-10-CM

## 2022-10-03 DIAGNOSIS — N1831 Chronic kidney disease, stage 3a: Secondary | ICD-10-CM

## 2022-10-03 DIAGNOSIS — G894 Chronic pain syndrome: Secondary | ICD-10-CM

## 2022-10-03 DIAGNOSIS — I480 Paroxysmal atrial fibrillation: Secondary | ICD-10-CM

## 2022-10-03 DIAGNOSIS — I152 Hypertension secondary to endocrine disorders: Secondary | ICD-10-CM | POA: Diagnosis not present

## 2022-10-03 LAB — BAYER DCA HB A1C WAIVED: HB A1C (BAYER DCA - WAIVED): 7 % — ABNORMAL HIGH (ref 4.8–5.6)

## 2022-10-03 NOTE — Assessment & Plan Note (Signed)
Chronic, ongoing.  Continue to monitor closely and refer to nephrology if decline.  Continue Benazepril for kidney protection and proteinuria.  Renal dose medications as needed based on labs.   

## 2022-10-03 NOTE — Assessment & Plan Note (Signed)
Chronic, ongoing.  Followed by neurosurgery and pain management as needed.  Continue this collaboration and current regimen. Lidocaine patches present at home.

## 2022-10-03 NOTE — Assessment & Plan Note (Signed)
Chronic, ongoing.  Followed by cardiology at this time.  Continue current medication regimen and collaboration, appreciate their input.  HR regular today with stable rate on exam, on bradycardic side per baseline.

## 2022-10-03 NOTE — Assessment & Plan Note (Signed)
Order for ultrasound to further assess, suspect gynecomastia.

## 2022-10-03 NOTE — Assessment & Plan Note (Signed)
Patient on Eliquis with A-fib, monitor CBC regularly.

## 2022-10-03 NOTE — Assessment & Plan Note (Addendum)
Chronic, ongoing with A1c 7% today and urine ALB 21 (February 2024).  Significant decreased sensation bilateral feet, monitor closely for wounds and falls.  Continue current diabetes medication regimen and adjust as needed. He did not get benefit from Lyrica or Gabapentin, will continue collaboration with neurology and pain clinic as needed.  Continue to monitor BS daily and document for visits. LABS: A1c and BMP.  - Statin and ACE on board - Vaccinations up to date - Foot and eye exams up to date

## 2022-10-03 NOTE — Assessment & Plan Note (Signed)
Ongoing, continue collaboration with cardiology and current medication regimen.  Recent notes reviewed.   

## 2022-10-03 NOTE — Assessment & Plan Note (Signed)
Chronic, stable.  To bilateral lower extremity.  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  Continue ASA daily and monitor closely for any wounds.  Return to vascular as needed. 

## 2022-10-03 NOTE — Assessment & Plan Note (Signed)
Chronic, ongoing.  Continue current medication regimen, tolerating 5 MG Crestor well without ADR, and adjust as needed.  Lipid panel today.

## 2022-10-03 NOTE — Assessment & Plan Note (Addendum)
Chronic, ongoing with A1c 7% today and urine ALB 21 (February 2024).  Continues to have stable BS control.  Will continue current medication regimen and adjust as needed.  Recommend he check BS at least 3 times daily.  Continue Benazepril for kidney protection. Discussed possible change to Jardiance vs Metformin for kidney and heart health, he will look into cost. - Vaccinations up to date - ACE and Statin on board - Foot and eye exam up to date.

## 2022-10-03 NOTE — Progress Notes (Addendum)
BP (!) 138/57   Pulse (!) 48   Temp 97.8 F (36.6 C) (Oral)   Ht 6\' 1"  (1.854 m)   Wt 185 lb 12.8 oz (84.3 kg)   SpO2 99%   BMI 24.51 kg/m    Subjective:    Patient ID: Sean Flax., male    DOB: 10-Sep-1940, 82 y.o.   MRN: [id number]  HPI: Sean Minckler. is a 82 y.o. male  Chief Complaint  Patient presents with  . Chronic Kidney Disease  . Diabetes  . Hyperlipidemia  . Hypertension   Did not sleep good last night, shin was bothering him.  He would like PCP to look at mass to left breast that he noticed 2-3 weeks ago.  It is a little touchy.  Has not gotten bigger.    DIABETES WITH NEUROPATHY A1c May 7%.  Continues Metformin 500 MG BID. Hypoglycemic episodes:no Polydipsia/polyuria: no Visual disturbance: no Chest pain: no Paresthesias: no Glucose Monitoring: yes  Accucheck frequency: 3 times a week  Fasting glucose: 123 this morning -- 130 average  Post prandial:  Evening:  Before meals: Taking Insulin?: no  Long acting insulin:  Short acting insulin: Blood Pressure Monitoring: daily Retinal Examination: Up To Date -- Dr. Beverely Pace in [place] Foot Exam: Up to Date Pneumovax: Up to Date Influenza: Up to Date Aspirin: no    HYPERTENSION / HYPERLIPIDEMIA Saw cardiology on 09/26/22.  Metoprolol XL has been discontinued due to symptomatic bradycardia.  Taking Imdur, Amlodipine, and Benazepril. Taking Rosuvastatin 5 MG for HLD.  Last saw pulmonary on 08/08/22 --  ordered Anoro and patient reports he is using this.  Not noticing much difference as of yet.    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  Continues CPAP 100% of the time. Satisfied with current treatment? yes Duration of hypertension: chronic BP monitoring frequency: daily - checks before he takes medication BP range: 109/67 to 161/94 -- on average <130/80 = HR 42 to 87, on average 40 to 60 range BP medication side effects: no Duration of hyperlipidemia: chronic Cholesterol medication side  effects: no Cholesterol supplements: none Medication compliance: good compliance Aspirin: no Recent stressors: no Recurrent headaches: no Visual changes: no Palpitations: on occasion Dyspnea: occasional during vigorous activity Chest pain: no Lower extremity edema: no Dizzy/lightheaded: if tries to exert self  ATRIAL FIBRILLATION Followed by cardiology.   Atrial fibrillation status: stable Satisfied with current treatment: yes  Medication side effects:  no Medication compliance: good compliance Etiology of atrial fibrillation: unknown Palpitations: as above Chest pain:  no Dyspnea on exertion: as above Orthopnea:  no Syncope:  no Edema:  no Ventricular rate control: B-blocker Anti-coagulation: long acting   CHRONIC KIDNEY DISEASE Last visit was stable, has CKD 3a.  Continues Tamsulosin, Proscar, and Vesicare per urology, last visit 04/19/22. CKD status: stable Medications renally dose: yes Previous renal evaluation: no Pneumovax:  Up to Date Influenza Vaccine:  Up to Date   CHRONIC PAIN  Has pain stimulator  Ongoing neuropathy issues at baseline. Saw pain management on 09/21/22 with no changes. Pain control status: stable Duration: years Location: back Quality: dull, aching, and throbbing Current Pain Level: improved with pain stimulator - mild 2-3/10 Previous Pain Level: moderate 7-8/10 Breakthrough pain: no Benefit from narcotic medications:  not checking What Activities task can be accomplished with current medication? yes Interested in weaning off narcotics:no   Stool softners/OTC fiber: no  Previous pain specialty evaluation: yes Non-narcotic analgesic meds: no Narcotic  contract: no  Relevant past medical, surgical, family and social history reviewed and updated as indicated. Interim medical history since our last visit reviewed. Allergies and medications reviewed and updated.  Review of Systems  Constitutional:  Negative for activity change, appetite  change, diaphoresis, fatigue and fever.  Respiratory:  Negative for cough, chest tightness, shortness of breath and wheezing.   Cardiovascular:  Negative for chest pain, palpitations and leg swelling.  Gastrointestinal: Negative.   Endocrine: Negative for cold intolerance, heat intolerance, polydipsia, polyphagia and polyuria.  Neurological: Negative.   Psychiatric/Behavioral: Negative.     Per HPI unless specifically indicated above     Objective:    BP (!) 138/57   Pulse (!) 48   Temp 97.8 F (36.6 C) (Oral)   Ht 6\' 1"  (1.854 m)   Wt 185 lb 12.8 oz (84.3 kg)   SpO2 99%   BMI 24.51 kg/m   Wt Readings from Last 3 Encounters:  10/03/22 185 lb 12.8 oz (84.3 kg)  09/26/22 186 lb 2 oz (84.4 kg)  09/19/22 188 lb (85.3 kg)    Physical Exam Vitals and nursing note reviewed.  Constitutional:      General: He is awake. He is not in acute distress.    Appearance: He is well-developed and well-groomed. He is not ill-appearing.  HENT:     Head: Normocephalic and atraumatic.     Right Ear: Hearing normal. No drainage.     Left Ear: Hearing normal. No drainage.  Eyes:     General: Lids are normal.        Right eye: No discharge.        Left eye: No discharge.     Conjunctiva/sclera: Conjunctivae normal.     Pupils: Pupils are equal, round, and reactive to light.  Neck:     Thyroid: No thyromegaly.     Vascular: No carotid bruit.     Trachea: Trachea normal.  Cardiovascular:     Rate and Rhythm: Regular rhythm. Bradycardia present.     Heart sounds: Normal heart sounds, S1 normal and S2 normal. No murmur heard.    No gallop.  Pulmonary:     Effort: Pulmonary effort is normal. No accessory muscle usage or respiratory distress.     Breath sounds: Normal breath sounds.  Chest:  Breasts:    Right: Normal.     Left: Mass present.     Comments: Solid mass noted under left nipple, mobile, approx 4 cm in size.  No tenderness. Abdominal:     General: Bowel sounds are normal.      Palpations: Abdomen is soft.  Musculoskeletal:        General: Normal range of motion.     Cervical back: Normal range of motion and neck supple.     Right lower leg: No edema.     Left lower leg: No edema.  Lymphadenopathy:     Cervical: No cervical adenopathy.     Upper Body:     Right upper body: No supraclavicular, axillary or pectoral adenopathy.     Left upper body: No supraclavicular, axillary or pectoral adenopathy.  Skin:    General: Skin is warm and dry.     Capillary Refill: Capillary refill takes less than 2 seconds.  Neurological:     Mental Status: He is alert and oriented to person, place, and time.     Deep Tendon Reflexes:     Reflex Scores:      Patellar reflexes are 1+ on  the right side and 1+ on the left side.      Achilles reflexes are 1+ on the right side and 1+ on the left side.    Comments: Antalgic gait, using cane.  Psychiatric:        Attention and Perception: Attention normal.        Mood and Affect: Mood normal.        Speech: Speech normal.        Behavior: Behavior normal. Behavior is cooperative.        Thought Content: Thought content normal.   Results for orders placed or performed in visit on 07/03/22  Bayer DCA Hb A1c Waived  Result Value Ref Range   HB A1C (BAYER DCA - WAIVED) 7.0 (H) 4.8 - 5.6 %  Basic metabolic panel  Result Value Ref Range   Glucose 230 (H) 70 - 99 mg/dL   BUN 20 8 - 27 mg/dL   Creatinine, Ser 8.65 0.76 - 1.27 mg/dL   eGFR 60 >78 IO/NGE/9.52   BUN/Creatinine Ratio 16 10 - 24   Sodium 134 134 - 144 mmol/L   Potassium 4.3 3.5 - 5.2 mmol/L   Chloride 96 96 - 106 mmol/L   CO2 23 20 - 29 mmol/L   Calcium 9.6 8.6 - 10.2 mg/dL  T4, free  Result Value Ref Range   Free T4 1.39 0.82 - 1.77 ng/dL  TSH  Result Value Ref Range   TSH 3.660 0.450 - 4.500 uIU/mL      Assessment & Plan:   Problem List Items Addressed This Visit       Cardiovascular and Mediastinum   AF (paroxysmal atrial fibrillation) (HCC)    Chronic,  ongoing.  Followed by cardiology at this time.  Continue current medication regimen and collaboration, appreciate their input.  HR regular today with stable rate on exam, on bradycardic side per baseline.      Relevant Medications   amLODipine (NORVASC) 5 MG tablet   benazepril (LOTENSIN) 20 MG tablet   Other Relevant Orders   Basic metabolic panel   Coronary artery disease    Ongoing, continue collaboration with cardiology and current medication regimen.  Recent notes reviewed.        Relevant Medications   amLODipine (NORVASC) 5 MG tablet   benazepril (LOTENSIN) 20 MG tablet   Hypertension associated with diabetes (HCC)    Chronic, stable.  BP at goal in office today.  A1c 7% today, remaining stable and urine ALB 05 April 2022.  Continue Metformin and current cardiac medications as ordered.  Will continue collaboration with cardiology, appreciate their input, recent notes reviewed.  Recommend he continue to check BP at home at least 3 mornings a week and document.  DASH diet.  LABS: A1c and BMP.  - Statin and ACE on board - Vaccinations up to date - Foot and eye exams up to date      Relevant Medications   amLODipine (NORVASC) 5 MG tablet   benazepril (LOTENSIN) 20 MG tablet   Other Relevant Orders   Bayer DCA Hb A1c Waived   Basic metabolic panel   Peripheral vascular disease (HCC)    Chronic, stable.  To bilateral lower extremity.  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  Continue ASA daily and monitor closely for any wounds.  Return to vascular as needed.      Relevant Medications   amLODipine (NORVASC) 5 MG tablet   benazepril (LOTENSIN) 20 MG tablet   Other Relevant Orders   Lipid  Panel w/o Chol/HDL Ratio     Respiratory   COPD (chronic obstructive pulmonary disease) (HCC)    Chronic, stable.  Followed by pulmonary at this time, continue this collaboration.  He is using Anoro without issue.  Reports some improvement in SOB.        Endocrine   Diabetes mellitus with autonomic  neuropathy (HCC)    Chronic, ongoing with A1c 7% today and urine ALB 21 (February 2024).  Significant decreased sensation bilateral feet, monitor closely for wounds and falls.  Continue current diabetes medication regimen and adjust as needed. He did not get benefit from Lyrica or Gabapentin, will continue collaboration with neurology and pain clinic as needed.  Continue to monitor BS daily and document for visits. LABS: A1c and BMP.  - Statin and ACE on board - Vaccinations up to date - Foot and eye exams up to date      Relevant Medications   benazepril (LOTENSIN) 20 MG tablet   Other Relevant Orders   Bayer DCA Hb A1c Waived   Basic metabolic panel   Diabetes mellitus with proteinuria (HCC) - Primary    Chronic, ongoing with A1c 7% today and urine ALB 21 (February 2024).  Continues to have stable BS control.  Will continue current medication regimen and adjust as needed.  Recommend he check BS at least 3 times daily.  Continue Benazepril for kidney protection. Discussed possible change to Jardiance vs Metformin for kidney and heart health, he will look into cost. - Vaccinations up to date - ACE and Statin on board - Foot and eye exam up to date.      Relevant Medications   benazepril (LOTENSIN) 20 MG tablet   Other Relevant Orders   Bayer DCA Hb A1c Waived   Basic metabolic panel   Hyperlipidemia associated with type 2 diabetes mellitus (HCC)    Chronic, ongoing.  Continue current medication regimen, tolerating 5 MG Crestor well without ADR, and adjust as needed.  Lipid panel today.      Relevant Medications   amLODipine (NORVASC) 5 MG tablet   benazepril (LOTENSIN) 20 MG tablet   Other Relevant Orders   Bayer DCA Hb A1c Waived   Lipid Panel w/o Chol/HDL Ratio     Genitourinary   CKD (chronic kidney disease) stage 3, GFR 30-59 ml/min (HCC)    Chronic, ongoing.  Continue to monitor closely and refer to nephrology if decline.  Continue Benazepril for kidney protection and  proteinuria.  Renal dose medications as needed based on labs.        Relevant Orders   Basic metabolic panel     Hematopoietic and Hemostatic   Acquired thrombophilia (HCC)    Patient on Eliquis with A-fib, monitor CBC regularly.        Other   Chronic pain syndrome    Chronic, ongoing.  Followed by neurosurgery and pain management as needed.  Continue this collaboration and current regimen. Lidocaine patches present at home.      Subareolar mass of left breast    Order for ultrasound to further assess, suspect gynecomastia.      Relevant Orders   US BREAST COMPLETE UNI LEFT INC AXILLA     Follow up plan: Return in about 3 months (around 01/03/2023) for T2DM, HTN/HLD, A-FIB, CKD, CHRONIC PAIN.

## 2022-10-03 NOTE — Assessment & Plan Note (Signed)
Chronic, stable.  BP at goal in office today.  A1c 7% today, remaining stable and urine ALB 05 April 2022.  Continue Metformin and current cardiac medications as ordered.  Will continue collaboration with cardiology, appreciate their input, recent notes reviewed.  Recommend he continue to check BP at home at least 3 mornings a week and document.  DASH diet.  LABS: A1c and BMP.  - Statin and ACE on board - Vaccinations up to date - Foot and eye exams up to date

## 2022-10-03 NOTE — Assessment & Plan Note (Signed)
Chronic, stable.  Followed by pulmonary at this time, continue this collaboration.  He is using Anoro without issue.  Reports some improvement in SOB.

## 2022-10-04 ENCOUNTER — Ambulatory Visit
Admission: RE | Admit: 2022-10-04 | Discharge: 2022-10-04 | Disposition: A | Discharge status: Home / Self Care | Payer: Medicare Other | Source: Ambulatory Visit | Attending: Nurse Practitioner | Admitting: Nurse Practitioner

## 2022-10-04 DIAGNOSIS — R92323 Mammographic fibroglandular density, bilateral breasts: Secondary | ICD-10-CM | POA: Diagnosis not present

## 2022-10-04 DIAGNOSIS — N62 Hypertrophy of breast: Secondary | ICD-10-CM | POA: Diagnosis not present

## 2022-10-04 DIAGNOSIS — N6342 Unspecified lump in left breast, subareolar: Secondary | ICD-10-CM | POA: Insufficient documentation

## 2022-10-04 DIAGNOSIS — N644 Mastodynia: Secondary | ICD-10-CM | POA: Diagnosis not present

## 2022-10-04 LAB — BASIC METABOLIC PANEL
BUN/Creatinine Ratio: 16 (ref 10–24)
BUN: 19 mg/dL (ref 8–27)
CO2: 24 mmol/L (ref 20–29)
Calcium: 9.4 mg/dL (ref 8.6–10.2)
Chloride: 98 mmol/L (ref 96–106)
Creatinine, Ser: 1.18 mg/dL (ref 0.76–1.27)
Glucose: 217 mg/dL — ABNORMAL HIGH (ref 70–99)
Potassium: 4.2 mmol/L (ref 3.5–5.2)
Sodium: 136 mmol/L (ref 134–144)
eGFR: 62 mL/min/{1.73_m2} (ref >59)

## 2022-10-04 LAB — LIPID PANEL W/O CHOL/HDL RATIO
Cholesterol, Total: 110 mg/dL (ref 100–199)
HDL: 40 mg/dL (ref >39)
LDL Chol Calc (NIH): 53 mg/dL (ref 0–99)
Triglycerides: 89 mg/dL (ref 0–149)
VLDL Cholesterol Cal: 17 mg/dL (ref 5–40)

## 2022-10-04 NOTE — Progress Notes (Signed)
Contacted via MyChart   Good evening Sean Reyes, your labs have returned: - Kidney function, creatinine and eGFR, remains normal.  Glucose a little elevated, but your A1c remained stable yesterday so no changes needed. - Cholesterol levels stable.  No medication changes needed.  Any questions? Keep being awesome!!  Thank you for allowing me to participate in your care.  I appreciate you. Kindest regards, Prudy Candy

## 2022-10-04 NOTE — Progress Notes (Signed)
Contacted via MyChart   As we discussed it is gynecomastia, very common finding at times in men and often related to a medication being taken.  No further imaging needed.  We will just monitor:)

## 2022-10-09 ENCOUNTER — Telehealth: Payer: Self-pay | Admitting: Cardiovascular Disease

## 2022-10-09 ENCOUNTER — Telehealth: Payer: Self-pay | Admitting: Nurse Practitioner

## 2022-10-09 DIAGNOSIS — I2510 Atherosclerotic heart disease of native coronary artery without angina pectoris: Secondary | ICD-10-CM

## 2022-10-09 DIAGNOSIS — I480 Paroxysmal atrial fibrillation: Secondary | ICD-10-CM

## 2022-10-09 NOTE — Telephone Encounter (Signed)
Copied from CRM [id number]. Topic: General - Other >> Oct 09, 2022  1:19 PM Phill Myron wrote: Dr Beverely Pace calling. Sean Reyes has not had a diabetic eye exam since August 7th 2023.  I will call him and try to get him in, when I do I will send the report to you

## 2022-10-09 NOTE — Telephone Encounter (Signed)
I recommended 2 weeks ZIO monitor to see what is going on with his rhythm.

## 2022-10-09 NOTE — Telephone Encounter (Signed)
STAT if HR is under 50 or over 120 (normal HR is 60-100 beats per minute)  What is your heart rate? 45  Do you have a log of your heart rate readings (document readings)?   Do you have any other symptoms? [name] left hand fingers are numb and he feels extremely fatigued  161/85

## 2022-10-09 NOTE — Telephone Encounter (Signed)
Called patient, advised of message below from MD.   Heart monitor ordered for 2 weeks. Patient verbalized understanding.

## 2022-10-09 NOTE — Telephone Encounter (Signed)
Call transferred to triage, advising that his heart rate was low- stating it will run between 45-50 bpm. He states his current blood pressure was 161/85. He states he has pounding in his chest when he is up walking around, he states when he recently seen Dr.Arida he was feeling well, but states over the weekend he thinks he was in afib, he states he uses a machine that tells him "possible afib"- he states that he just feels so bad and feels so weak. He is not sure what is going on, but wanted Dr.Arida to be aware.   He is still currently taking all of his medications on his list. He does mention having some issues with his fingers on his left hand going numb, like his legs although he has neuropathy. I advised I would route to MD to advise further and see if he has any recommendations.   Thanks!

## 2022-10-10 NOTE — Telephone Encounter (Signed)
Called patient to clarify if he had his exam he stated that he has and will inform Dr. Beverely Pace to  send over the exam to the office.

## 2022-10-11 ENCOUNTER — Inpatient Hospital Stay: Payer: Medicare Other | Attending: Internal Medicine

## 2022-10-11 ENCOUNTER — Encounter: Payer: Self-pay | Admitting: Medical Oncology

## 2022-10-11 ENCOUNTER — Inpatient Hospital Stay: Payer: Medicare Other

## 2022-10-11 ENCOUNTER — Inpatient Hospital Stay (HOSPITAL_BASED_OUTPATIENT_CLINIC_OR_DEPARTMENT_OTHER): Payer: Medicare Other | Admitting: Medical Oncology

## 2022-10-11 VITALS — BP 126/51 | HR 74 | Temp 97.3°F | Wt 185.8 lb

## 2022-10-11 VITALS — BP 140/76 | HR 66

## 2022-10-11 DIAGNOSIS — N183 Chronic kidney disease, stage 3 unspecified: Secondary | ICD-10-CM | POA: Diagnosis not present

## 2022-10-11 DIAGNOSIS — D509 Iron deficiency anemia, unspecified: Secondary | ICD-10-CM | POA: Diagnosis not present

## 2022-10-11 DIAGNOSIS — Z79899 Other long term (current) drug therapy: Secondary | ICD-10-CM | POA: Diagnosis not present

## 2022-10-11 DIAGNOSIS — N1831 Chronic kidney disease, stage 3a: Secondary | ICD-10-CM

## 2022-10-11 DIAGNOSIS — I480 Paroxysmal atrial fibrillation: Secondary | ICD-10-CM | POA: Diagnosis not present

## 2022-10-11 DIAGNOSIS — D649 Anemia, unspecified: Secondary | ICD-10-CM

## 2022-10-11 DIAGNOSIS — D631 Anemia in chronic kidney disease: Secondary | ICD-10-CM | POA: Diagnosis not present

## 2022-10-11 LAB — CMP (CANCER CENTER ONLY)
ALT: 14 U/L (ref 0–44)
AST: 17 U/L (ref 15–41)
Albumin: 3.9 g/dL (ref 3.5–5.0)
Alkaline Phosphatase: 68 U/L (ref 38–126)
Anion gap: 7 (ref 5–15)
BUN: 23 mg/dL (ref 8–23)
CO2: 25 mmol/L (ref 22–32)
Calcium: 9.2 mg/dL (ref 8.9–10.3)
Chloride: 99 mmol/L (ref 98–111)
Creatinine: 1.23 mg/dL (ref 0.61–1.24)
GFR, Estimated: 59 mL/min — ABNORMAL LOW (ref >60)
Glucose, Bld: 217 mg/dL — ABNORMAL HIGH (ref 70–99)
Potassium: 4.3 mmol/L (ref 3.5–5.1)
Sodium: 131 mmol/L — ABNORMAL LOW (ref 135–145)
Total Bilirubin: 0.4 mg/dL (ref 0.3–1.2)
Total Protein: 7.3 g/dL (ref 6.5–8.1)

## 2022-10-11 LAB — IRON AND TIBC
Iron: 57 ug/dL (ref 45–182)
Saturation Ratios: 16 % — ABNORMAL LOW (ref 17.9–39.5)
TIBC: 347 ug/dL (ref 250–450)
UIBC: 290 ug/dL

## 2022-10-11 LAB — CBC WITH DIFFERENTIAL (CANCER CENTER ONLY)
Abs Immature Granulocytes: 0.02 10*3/uL (ref 0.00–0.07)
Basophils Absolute: 0 10*3/uL (ref 0.0–0.1)
Basophils Relative: 0 %
Eosinophils Absolute: 0 10*3/uL (ref 0.0–0.5)
Eosinophils Relative: 0 %
HCT: 33.5 % — ABNORMAL LOW (ref 39.0–52.0)
Hemoglobin: 11.1 g/dL — ABNORMAL LOW (ref 13.0–17.0)
Immature Granulocytes: 0 %
Lymphocytes Relative: 23 %
Lymphs Abs: 1.7 10*3/uL (ref 0.7–4.0)
MCH: 30.2 pg (ref 26.0–34.0)
MCHC: 33.1 g/dL (ref 30.0–36.0)
MCV: 91 fL (ref 80.0–100.0)
Monocytes Absolute: 0.8 10*3/uL (ref 0.1–1.0)
Monocytes Relative: 12 %
Neutro Abs: 4.6 10*3/uL (ref 1.7–7.7)
Neutrophils Relative %: 65 %
Platelet Count: 135 10*3/uL — ABNORMAL LOW (ref 150–400)
RBC: 3.68 MIL/uL — ABNORMAL LOW (ref 4.22–5.81)
RDW: 13.2 % (ref 11.5–15.5)
WBC Count: 7.1 10*3/uL (ref 4.0–10.5)
nRBC: 0 % (ref 0.0–0.2)

## 2022-10-11 LAB — FERRITIN: Ferritin: 65 ng/mL (ref 24–336)

## 2022-10-11 MED ORDER — SODIUM CHLORIDE 0.9 % IV SOLN
200.0000 mg | Freq: Once | INTRAVENOUS | Status: DC
  Filled 2022-10-11: qty 10

## 2022-10-11 MED ORDER — SODIUM CHLORIDE 0.9 % IV SOLN
Freq: Once | INTRAVENOUS | Status: AC
  Filled 2022-10-11: qty 250

## 2022-10-11 MED ORDER — SODIUM CHLORIDE 0.9 % IV SOLN
200.0000 mg | Freq: Once | INTRAVENOUS | Status: AC
  Administered 2022-10-11: 200 mg via INTRAVENOUS
  Filled 2022-10-11: qty 200

## 2022-10-11 NOTE — Progress Notes (Signed)
[facility] Office Visit Follow Up Note  Patient Care Team: Marjie Skiff, NP as PCP - General (Nurse Practitioner) Iran Ouch, MD as PCP - Cardiology (Cardiology) Hooten, Illene Labrador, MD (Orthopedic Surgery) Leonides Cave, MD as Referring Physician (Urology) Marlowe Sax, RN as Case Manager (General Practice) Earna Coder, MD as Consulting Physician (Oncology) Venetia Night, MD as Consulting Physician (Neurosurgery) Edward Jolly, MD as Consulting Physician (Pain Medicine)  CHIEF COMPLAINTS/PURPOSE OF CONSULTATION: ANEMIA   HEMATOLOGY HISTORY: ;  #Chronic anemia hemoglobin 10-11; October 22 -iron saturation 17% ferritin 90s./? CKD-S/p Venofer x4; Jan 2023- Hb 12.3;  EGD/Colonoscopy: Sep, 2022- Dr.Anna  # OCT 2022-non-STEMI- [s/p stenting]aspirin Brilinta; paroxysmal A. fib on Eliquis [Dr. Paraschoes/Dr.Kowalski--> Dr. Arida];CKD stage III; chronic back pain-s/p back surgeries s/p spinal cord-pain stimulator; diabetes on oral medication; 2022-C. difficile colitis-s/p oral vancomycin.   HISTORY OF PRESENTING ILLNESS: Ambulating cane/crutches; alone.  Sean Reyes. 82 y.o.  male iron deficient anemia of unknown origin with concurrent CKD stage III is here for follow-up.   He reports that he is "ok". He is having some troubles with fatigue, joint pains, and A. Fib. He is working closely with his PCP and specialists.   Patient currently on oral iron/slow release which he is tolerating well. Also has tolerated IV Venofer well in the past with his last series being in Nov-Dec 2022.   There has been no bleeding to his knowledge: denies epistaxis, gingivitis, hemoptysis, hematemesis, hematuria, melena, excessive bruising, blood donation.    Review of Systems  Constitutional:  Positive for malaise/fatigue. Negative for chills, diaphoresis, fever and weight loss.  HENT:  Negative for nosebleeds and sore throat.   Eyes:  Negative for double vision.   Respiratory:  Positive for shortness of breath. Negative for cough, hemoptysis, sputum production and wheezing.   Cardiovascular:  Negative for chest pain, palpitations, orthopnea and leg swelling.  Gastrointestinal:  Negative for abdominal pain, blood in stool, constipation, diarrhea, heartburn, melena, nausea and vomiting.  Genitourinary:  Negative for dysuria, frequency and urgency.  Musculoskeletal:  Positive for back pain and joint pain.  Skin: Negative.  Negative for itching and rash.  Neurological:  Negative for dizziness, tingling, focal weakness, weakness and headaches.  Endo/Heme/Allergies:  Does not bruise/bleed easily.  Psychiatric/Behavioral:  Negative for depression. The patient is not nervous/anxious and does not have insomnia.     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   Hyperlipidemia    Hypertension    Myocardial infarction (HCC) 12/2020   Nocturia    S/P ablation of atrial fibrillation    Ablative therapy   Sleep apnea    CPAP    Spinal stenosis    Tachycardia, unspecified     SURGICAL HISTORY: Past Surgical History:  Procedure Laterality Date   ABLATION     ANTERIOR LAT LUMBAR  FUSION N/A 06/27/2017   Procedure: Anterior Lateral Lumbar Interbody  Fusion - Lumbar Two-Lumbar Three - Lumbar Three-Lumbar Four, Posterior Lumbar Interbody Fusion Lumbar Four- Five;  Surgeon: Donalee Citrin, MD;  Location: [facility] OR;  Service: Neurosurgery;  Laterality: N/A;  Anterior Lateral Lumbar Interbody  Fusion -  Lumbar Two-Lumbar Three - Lumbar Three-Lumbar Four, Posterior Lumbar Interbody Fusion Lumbar Four- Five   BACK SURGERY     CARDIAC CATHETERIZATION     CARDIOVERSION N/A 08/29/2018   Procedure: CARDIOVERSION;  Surgeon: Lamar Blinks, MD;  Location: [facility] ORS;  Service: Cardiovascular;  Laterality: N/A;   CARDIOVERSION N/A 09/24/2018   Procedure: CARDIOVERSION;  Surgeon: Lamar Blinks, MD;  Location: [facility] ORS;  Service: Cardiovascular;  Laterality: N/A;   COLONOSCOPY WITH PROPOFOL N/A 10/05/2015   Procedure: COLONOSCOPY WITH PROPOFOL;  Surgeon: Christena Deem, MD;  Location: [facility] ENDOSCOPY;  Service: Endoscopy;  Laterality: N/A;   COLONOSCOPY WITH PROPOFOL N/A 11/01/2020   Procedure: COLONOSCOPY WITH PROPOFOL;  Surgeon: Wyline Mood, MD;  Location: [facility] ENDOSCOPY;  Service: Gastroenterology;  Laterality: N/A;   CORONARY STENT INTERVENTION N/A 12/22/2020   Procedure: CORONARY STENT INTERVENTION;  Surgeon: Marcina Millard, MD;  Location: [facility] INVASIVE CV LAB;  Service: Cardiovascular;  Laterality: N/A;   ESOPHAGOGASTRODUODENOSCOPY N/A 11/01/2020   Procedure: ESOPHAGOGASTRODUODENOSCOPY (EGD);  Surgeon: Wyline Mood, MD;  Location: [facility] ENDOSCOPY;  Service: Gastroenterology;  Laterality: N/A;   ESOPHAGOGASTRODUODENOSCOPY (EGD) WITH PROPOFOL N/A 04/01/2018   Procedure: ESOPHAGOGASTRODUODENOSCOPY (EGD) WITH PROPOFOL;  Surgeon: Christena Deem, MD;  Location[facility] ENDOSCOPY;  Service: Endoscopy;  Laterality: N/A;   ESOPHAGOGASTRODUODENOSCOPY (EGD) WITH PROPOFOL N/A 06/01/2022   Procedure: ESOPHAGOGASTRODUODENOSCOPY (EGD) WITH PROPOFOL;  Surgeon: Wyline Mood, MD;  Location: [facility] ENDOSCOPY;  Service: Gastroenterology;  Laterality: N/A;   EYE SURGERY     HERNIA REPAIR     JOINT REPLACEMENT Bilateral    hips  RT+  LEFT X2    LEFT HEART CATH AND CORONARY ANGIOGRAPHY N/A 12/22/2020   Procedure: LEFT HEART CATH AND CORONARY ANGIOGRAPHY;  Surgeon: Marcina Millard, MD;  Location: [facility]  INVASIVE CV LAB;  Service: Cardiovascular;  Laterality: N/A;   LEFT HEART CATH AND CORONARY ANGIOGRAPHY Left 08/23/2021   Procedure: LEFT HEART CATH AND CORONARY ANGIOGRAPHY;  Surgeon: Lamar Blinks, MD;  Location: [facility] INVASIVE CV LAB;  Service: Cardiovascular;  Laterality: Left;   LUMBAR LAMINECTOMY/DECOMPRESSION MICRODISCECTOMY Left 09/13/2016   Procedure: Microdiscectomy - Lumbar two-three,  Lumbar three- - left;  Surgeon: Donalee Citrin, MD;  Location: [facility] OR;  Service: Neurosurgery;  Laterality: Left;   SPINAL CORD STIMULATOR INSERTION  07/08/2019   TONSILLECTOMY      SOCIAL HISTORY: Social History   Socioeconomic History   Marital status: Married    Spouse name: Elnita Maxwell    Number of children: 2   Years of education: Not on file   Highest education level: High school graduate  Occupational History   Occupation: retired   Tobacco Use   Smoking status: Never   Smokeless tobacco: Never  Vaping Use   Vaping status: Never Used  Substance and Sexual Activity   Alcohol use: No   Drug use: No   Sexual activity: Not on file  Other Topics Concern   Not on file  Social History Narrative   MarriedGets regular exercise.      Lives in [place] with wife/ daughter. Never smoked; no alcohol. 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    Social Determinants of Health   Financial Resource Strain:  Low Risk  (09/19/2022)   Overall Financial Resource Strain (CARDIA)    Difficulty of Paying Living Expenses: Not hard at all  Food Insecurity: No Food Insecurity (09/19/2022)   Hunger Vital Sign    Worried About Running Out of Food in the Last Year: Never true    Ran Out of Food in the Last Year: Never true  Transportation Needs: No Transportation Needs (09/19/2022)   PRAPARE - Administrator, Civil Service (Medical): No    Lack of Transportation (Non-Medical): No  Physical Activity: Inactive (09/19/2022)   Exercise Vital Sign    Days of Exercise  per Week: 0 days    Minutes of Exercise per Session: 0 min  Stress: No Stress Concern Present (09/19/2022)   [employer] of Occupational Health - Occupational Stress Questionnaire    Feeling of Stress : Not at all  Social Connections: Socially Integrated (09/19/2022)   Social Connection and Isolation Panel [NHANES]    Frequency of Communication with Friends and Family: More than three times a week    Frequency of Social Gatherings with Friends and Family: Twice a week    Attends Religious Services: More than 4 times per year    Active Member of [employer] or Organizations: Yes    Attends Engineer, structural: More than 4 times per year    Marital Status: Married  Catering manager Violence: Not At Risk (09/19/2022)   Humiliation, Afraid, Rape, and Kick questionnaire    Fear of Current or Ex-Partner: No    Emotionally Abused: No    Physically Abused: No    Sexually Abused: No    FAMILY HISTORY: Family History  Problem Relation Age of Onset   Brain cancer Mother    Other Father        "blood clots in his lungs"   Kidney disease Neg Hx    Prostate cancer Neg Hx    Kidney cancer Neg Hx    Bladder Cancer Neg Hx     ALLERGIES:  is allergic to levaquin [levofloxacin in d5w], shellfish allergy, amiodarone, and adhesive [tape].  MEDICATIONS:  Current Outpatient Medications  Medication Sig Dispense Refill   amLODipine (NORVASC) 5 MG tablet Take 5 mg by mouth daily.     ANORO ELLIPTA 62.5-25 MCG/ACT AEPB Inhale 1 puff into the lungs daily.     benazepril (LOTENSIN) 20 MG tablet Take 20 mg by mouth daily.     cyanocobalamin 1000 MCG tablet Take by mouth.     ELIQUIS 5 MG TABS tablet Take 1 tablet (5 mg total) by mouth 2 (two) times daily. 180 tablet 4   ferrous sulfate 325 (65 FE) MG EC tablet Take 325 mg by mouth daily with breakfast.     finasteride (PROSCAR) 5 MG tablet Take 1 tablet (5 mg total) by mouth daily. 90 tablet 3   isosorbide mononitrate (IMDUR) 30 MG 24 hr tablet  Take 30 mg by mouth daily.     metFORMIN (GLUCOPHAGE) 500 MG tablet Take 1 tablet (500 mg total) by mouth 2 (two) times daily with a meal. 180 tablet 4   omeprazole (PRILOSEC) 40 MG capsule Take 1 capsule (40 mg total) by mouth in the morning and at bedtime. 180 capsule 3   Peppermint Oil (IBGARD) 90 MG CPCR Take 2 capsules by mouth 2 (two) times daily.     rosuvastatin (CRESTOR) 5 MG tablet Take 1 tablet (5 mg total) by mouth daily. 90 tablet 4   solifenacin (VESICARE) 5  MG tablet Take 1 tablet (5 mg total) by mouth daily. 90 tablet 3   tamsulosin (FLOMAX) 0.4 MG CAPS capsule Take 0.8 mg by mouth daily.     No current facility-administered medications for this visit.   Facility-Administered Medications Ordered in Other Visits  Medication Dose Route Frequency Provider Last Rate Last Admin   iron sucrose (VENOFER) 200 mg in sodium chloride 0.9 % 100 mL IVPB  200 mg Intravenous Once Louretta Shorten R, MD          PHYSICAL EXAMINATION: Vitals:   10/11/22 1305  BP: (!) 126/51  Pulse: 74  Temp: (!) 97.3 F (36.3 C)  SpO2: 100%    Filed Weights   10/11/22 1305  Weight: 185 lb 12.8 oz (84.3 kg)     Physical Exam Vitals and nursing note reviewed.  Constitutional:      Comments: Ambulating using a rolling walker  HENT:     Head: Normocephalic and atraumatic.     Mouth/Throat:     Pharynx: Oropharynx is clear.  Eyes:     Extraocular Movements: Extraocular movements intact.     Pupils: Pupils are equal, round, and reactive to light.  Cardiovascular:     Rate and Rhythm: Normal rate and regular rhythm.  Abdominal:     Palpations: Abdomen is soft.  Musculoskeletal:        General: Normal range of motion.     Cervical back: Normal range of motion.  Skin:    General: Skin is warm.  Neurological:     General: No focal deficit present.     Mental Status: He is alert and oriented to person, place, and time.  Psychiatric:        Behavior: Behavior normal.        Judgment:  Judgment normal.     LABORATORY DATA:  I have reviewed the data as listed Lab Results  Component Value Date   WBC 7.1 10/11/2022   HGB 11.1 (L) 10/11/2022   HCT 33.5 (L) 10/11/2022   MCV 91.0 10/11/2022   PLT 135 (L) 10/11/2022   Recent Labs    04/04/22 1118 04/04/22 1118 04/12/22 1238 05/25/22 2017 07/03/22 1109 10/03/22 1126 10/11/22 1253  NA 138   < > 134* 132* 134 136 131*  K 4.1  --  4.2 3.9 4.3 4.2 4.3  CL 99  --  99 98 96 98 99  CO2 23  --  26 25 23 24 25   GLUCOSE 222*   < > 191* 147* 230* 217* 217*  BUN 21   < > 24* 26* 20 19 23   CREATININE 1.41*  --  1.37* 1.18 1.23 1.18 1.23  CALCIUM 9.7  --  9.3 9.3 9.6 9.4 9.2  GFRNONAA  --   --  52* >60  --   --  59*  PROT 6.4  --  7.2  --   --   --  7.3  ALBUMIN 4.2  --  3.9  --   --   --  3.9  AST 21  --  20  --   --   --  17  ALT 21  --  15  --   --   --  14  ALKPHOS 63  --  56  --   --   --  68  BILITOT 0.4  --  0.6  --   --   --  0.4   < > = values in this interval not displayed.  Assessment and Plan:   Sean Reyes. Is a 82 y.o. male who is followed by our office for anemia secondary to CKD and iron deficiency of unknown origin. He is currently on PO iron with PRN IV Iron.   Today his Hgb has trended down slightly to 11.1 from 12.6. No known bleeding. Normal MCV. His mild thrombocytopenia waxes and wanes from 130s-190s. Today his platelet count is 135. WBC/ANC normal. His last ferritin 6 months ago was 61 with iron saturation of 19%; has been trending down slowly since his last IV Iron infusion. Creatinine has worsened slightly during this time.  Given all of this information I would suggest a treatment of IV iron today while we await his iron studies.    # CKD stage III [GFR 50s]- stable. Continue following by nephrology   # CAD [sp stenting OCT 2022; Dr.Arida]; on Eliquis/antiplatelet therapy. Would not be a candidate for erythropoietin therapy. stable  DISPOSITION:  Venofer today RTC 6 months lab only  (CBC, CMP, retic, iron, ferritin) RTC 6 months plus 1-7 days: MD, +- Venofer -[place]  All questions were answered. The patient knows to call the clinic with any problems, questions or concerns.    Rushie Chestnut, PA-C 10/11/2022 2:12 PM

## 2022-10-11 NOTE — Patient Instructions (Signed)
 Iron Sucrose Injection What is this medication? IRON SUCROSE (EYE ern SOO krose) treats low levels of iron (iron deficiency anemia) in people with kidney disease. Iron is a mineral that plays an important role in making red blood cells, which carry oxygen from your lungs to the rest of your body. This medicine may be used for other purposes; ask your health care provider or pharmacist if you have questions. COMMON BRAND NAME(S): Venofer What should I tell my care team before I take this medication? They need to know if you have any of these conditions: Anemia not caused by low iron levels Heart disease High levels of iron in the blood Kidney disease Liver disease An unusual or allergic reaction to iron, other medications, foods, dyes, or preservatives Pregnant or trying to get pregnant Breastfeeding How should I use this medication? This medication is for infusion into a vein. It is given in a hospital or clinic setting. Talk to your care team about the use of this medication in children. While this medication may be prescribed for children as young as 2 years for selected conditions, precautions do apply. Overdosage: If you think you have taken too much of this medicine contact a poison control center or emergency room at once. NOTE: This medicine is only for you. Do not share this medicine with others. What if I miss a dose? Keep appointments for follow-up doses. It is important not to miss your dose. Call your care team if you are unable to keep an appointment. What may interact with this medication? Do not take this medication with any of the following: Deferoxamine Dimercaprol Other iron products This medication may also interact with the following: Chloramphenicol Deferasirox This list may not describe all possible interactions. Give your health care provider a list of all the medicines, herbs, non-prescription drugs, or dietary supplements you use. Also tell them if you smoke,  drink alcohol, or use illegal drugs. Some items may interact with your medicine. What should I watch for while using this medication? Visit your care team regularly. Tell your care team if your symptoms do not start to get better or if they get worse. You may need blood work done while you are taking this medication. You may need to follow a special diet. Talk to your care team. Foods that contain iron include: whole grains/cereals, dried fruits, beans, or peas, leafy green vegetables, and organ meats (liver, kidney). What side effects may I notice from receiving this medication? Side effects that you should report to your care team as soon as possible: Allergic reactions--skin rash, itching, hives, swelling of the face, lips, tongue, or throat Low blood pressure--dizziness, feeling faint or lightheaded, blurry vision Shortness of breath Side effects that usually do not require medical attention (report to your care team if they continue or are bothersome): Flushing Headache Joint pain Muscle pain Nausea Pain, redness, or irritation at injection site This list may not describe all possible side effects. Call your doctor for medical advice about side effects. You may report side effects to FDA at 1-800-FDA-1088. Where should I keep my medication? This medication is given in a hospital or clinic. It will not be stored at home. NOTE: This sheet is a summary. It may not cover all possible information. If you have questions about this medicine, talk to your doctor, pharmacist, or health care provider.  2024 Elsevier/Gold Standard (2022-07-07 00:00:00)

## 2022-10-11 NOTE — Addendum Note (Signed)
Addended by: Alinda Deem H on: 10/11/2022 03:02 PM   Modules accepted: Orders

## 2022-10-12 ENCOUNTER — Ambulatory Visit: Payer: Self-pay | Admitting: *Deleted

## 2022-10-12 NOTE — Telephone Encounter (Signed)
  Chief Complaint: Left hand is numb and very weak.   Has neuropathy in both legs that are getting worse.   Symptoms: Also thinks he has pulled a muscle in his side. Frequency: side pain since last Sat. Pertinent Negatives: Patient denies being able to pick up things with his left hand without dropping them. Disposition: [] ED /[] Urgent Care (no appt availability in office) / [x] Appointment(In office/virtual)/ []  [facility]/ [] Home Care/ [] Refused Recommended Disposition /[] [place] [facility]/ []  Follow-up with PCP Additional Notes: No appts available with Jolene within timeframe he needs to be seen.   He really prefers to see Jolene.    He is asking if it's possible he can be worked in with her.    He is agreeable to someone calling him back.

## 2022-10-12 NOTE — Telephone Encounter (Signed)
Reason for Disposition  Weakness (i.e., loss of strength) of new-onset in hand or fingers  (Exceptions: not truly weak, hand feels weak because of pain; weakness present > 2 weeks)    Left hand is numb.  Answer Assessment - Initial Assessment Questions 1. ONSET: "When did the pain start?"     I'm having numbness in my left hand.   It's not working right.   I have bad neuropathy in my legs.   It's getting worse.   I think this may be part of that neuropathy.    I pick up something and I drop it.    2. LOCATION: "Where is the pain located?"     Left hand is numb.    It looks like I'm in A. Fib all the time too.   I'm to wear a monitor starting tomorrow.   I'm talking with my cardiologist.   3. PAIN: "How bad is the pain?" (Scale 1-10; or mild, moderate, severe)   - MILD (1-3): doesn't interfere with normal activities   - MODERATE (4-7): interferes with normal activities (e.g., work or school) or awakens from sleep   - SEVERE (8-10): excruciating pain, unable to use hand at all     It feels like it's asleep.  No pain.   It's really weak my left hand. 4. WORK OR EXERCISE: "Has there been any recent work or exercise that involved this part (i.e., hand or wrist) of the body?"     Not to hand.    Last Sat. Was on hands and knees putting a board in place on my deck and I think I pulled a muscle in my side.   I need that checked too. 5. CAUSE: "What do you think is causing the pain?"     Neuropathy in my hand like in my legs. 6. AGGRAVATING FACTORS: "What makes the pain worse?" (e.g., using computer)     It's getting weaker. 7. OTHER SYMPTOMS: "Do you have any other symptoms?" (e.g., neck pain, swelling, rash, numbness, fever)     Legs have this problem too   The neuropathy has gotten worse since I saw Jolene last.    8. PREGNANCY: "Is there any chance you are pregnant?" "When was your last menstrual period?"     N/A  Protocols used: Hand and Wrist Pain-A-AH

## 2022-10-13 ENCOUNTER — Telehealth: Payer: Self-pay | Admitting: Nurse Practitioner

## 2022-10-13 ENCOUNTER — Ambulatory Visit (INDEPENDENT_AMBULATORY_CARE_PROVIDER_SITE_OTHER): Payer: Medicare Other | Admitting: Nurse Practitioner

## 2022-10-13 ENCOUNTER — Encounter: Payer: Self-pay | Admitting: Nurse Practitioner

## 2022-10-13 VITALS — BP 134/71 | HR 51 | Temp 97.8°F | Wt 184.4 lb

## 2022-10-13 DIAGNOSIS — I480 Paroxysmal atrial fibrillation: Secondary | ICD-10-CM

## 2022-10-13 DIAGNOSIS — R200 Anesthesia of skin: Secondary | ICD-10-CM | POA: Diagnosis not present

## 2022-10-13 DIAGNOSIS — G894 Chronic pain syndrome: Secondary | ICD-10-CM

## 2022-10-13 NOTE — Assessment & Plan Note (Signed)
Chronic, ongoing.  Followed by cardiology at this time.  Continue current medication regimen and collaboration, appreciate their input.  HR regular today with stable rate on exam, on bradycardic side per baseline.

## 2022-10-13 NOTE — Assessment & Plan Note (Signed)
To left fingertips only, not full fingers or hand + none down arm.  Overall stable neuro exam, although fingertips left hand slight decrease sensation.  He is concerned about having Parkinson's/  Discussed with him to schedule follow-up with neurology, he missed this.  Provided him with number to call and he plans on calling Tuesday.  Recommend he start taking Alpha Lipoic Acid as Dr. Sherryll Burger had recommended, wrote this down for him.  No red flags on neuro exam today.

## 2022-10-13 NOTE — Telephone Encounter (Signed)
Patient called to f/u on a call back from provider for an appt. Refer to TE dated 8/29. Please f/u with patient.

## 2022-10-13 NOTE — Telephone Encounter (Signed)
Please contact patient regarding appointment per Jolene.

## 2022-10-13 NOTE — Patient Instructions (Addendum)
Alpha Lipoic Acid 600 mg a day over the counter  Call Dr. Margaretmary Eddy office: Leslie Langille Provost, MD  [phone] [facility] [address]  [facility]  [place], [place] [address]  [phone] (Work)   Neuropathic Pain Neuropathic pain is pain caused by damage to the nerves that are responsible for certain sensations in your body (sensory nerves). Neuropathic pain can make you more sensitive to pain. Even a minor sensation can feel very painful. This is usually a long-term (chronic) condition that can be difficult to treat. The type of pain differs from person to person. It may: Start suddenly (acute), or it may develop slowly and become chronic. Come and go as damaged nerves heal, or it may stay at the same level for years. Cause emotional distress, loss of sleep, and a lower quality of life. What are the causes? The most common cause of this condition is diabetes. Many other diseases and conditions can also cause neuropathic pain. Causes of neuropathic pain can be classified as: Toxic. This is caused by medicines and chemicals. The most common causes of toxic neuropathic pain is damage from medicines that kill cancer cells (chemotherapy) or alcohol abuse. Metabolic. This can be caused by: Diabetes. Lack of vitamins like B12. Traumatic. Any injury that cuts, crushes, or stretches a nerve can cause damage and pain. Compression-related. If a sensory nerve gets trapped or compressed for a long period of time, the blood supply to the nerve can be cut off. Vascular. Many blood vessel diseases can cause neuropathic pain by decreasing blood supply and oxygen to nerves. Autoimmune. This type of pain results from diseases in which the body's defense system (immune system) mistakenly attacks sensory nerves. Examples of autoimmune diseases that can cause neuropathic pain include lupus and multiple sclerosis. Infectious. Many types of viral infections can damage sensory nerves and cause pain.  Shingles infection is a common cause of this type of pain. Inherited. Neuropathic pain can be a symptom of many diseases that are passed down through families (genetic). What increases the risk? You are more likely to develop this condition if: You have diabetes. You smoke. You drink too much alcohol. You are taking certain medicines, including chemotherapy or medicines that treat immune system disorders. What are the signs or symptoms? The main symptom is pain. Neuropathic pain is often described as: Burning. Shock-like. Stinging. Hot or cold. Itching. How is this diagnosed? No single test can diagnose neuropathic pain. It is diagnosed based on: A physical exam and your symptoms. Your health care provider will ask you about your pain. You may be asked to use a pain scale to describe how bad your pain is. Tests. These may be done to see if you have a cause and location of any nerve damage. They include: Nerve conduction studies and electromyography to test how well nerve signals travel through your nerves and muscles (electrodiagnostic testing). Skin biopsy to evaluate for small fiber neuropathy. Imaging studies, such as: X-rays. CT scan. MRI. How is this treated? Treatment for neuropathic pain may change over time. You may need to try different treatment options or a combination of treatments. Some options include: Treating the underlying cause of the neuropathy, such as diabetes, kidney disease, or vitamin deficiencies. Stopping medicines that can cause neuropathy, such as chemotherapy. Medicine to relieve pain. Medicines may include: Prescription or over-the-counter pain medicine. Anti-seizure medicine. Antidepressant medicines. Pain-relieving patches or creams that are applied to painful areas of skin. A medicine to numb the area (local anesthetic), which can be  injected as a nerve block. Transcutaneous nerve stimulation. This uses electrical currents to block painful nerve  signals. The treatment is painless. Alternative treatments, such as: Acupuncture. Meditation. Massage. Occupational or physical therapy. Pain management programs. Counseling. Follow these instructions at home: Medicines  Take over-the-counter and prescription medicines only as told by your health care provider. Ask your health care provider if the medicine prescribed to you: Requires you to avoid driving or using machinery. Can cause constipation. You may need to take these actions to prevent or treat constipation: Drink enough fluid to keep your urine pale yellow. Take over-the-counter or prescription medicines. Eat foods that are high in fiber, such as beans, whole grains, and fresh fruits and vegetables. Limit foods that are high in fat and processed sugars, such as fried or sweet foods. Lifestyle  Have a good support system at home. Consider joining a chronic pain support group. Do not use any products that contain nicotine or tobacco. These products include cigarettes, chewing tobacco, and vaping devices, such as e-cigarettes. If you need help quitting, ask your health care provider. Do not drink alcohol. General instructions Learn as much as you can about your condition. Work closely with all your health care providers to find the treatment plan that works best for you. Ask your health care provider what activities are safe for you. Keep all follow-up visits. This is important. Contact a health care provider if: Your pain treatments are not working. You are having side effects from your medicines. You are struggling with tiredness (fatigue), mood changes, depression, or anxiety. Get help right away if: You have thoughts of hurting yourself. Get help right away if you feel like you may hurt yourself or others, or have thoughts about taking your own life. Go to your nearest emergency room or: Call 911. Call the National Suicide Prevention Lifeline at 7061677184 or 988.  This is open 24 hours a day. Text the Crisis Text Line at (709)830-5031. Summary Neuropathic pain is pain caused by damage to the nerves that are responsible for certain sensations in your body (sensory nerves). Neuropathic pain may come and go as damaged nerves heal, or it may stay at the same level for years. Neuropathic pain is usually a long-term condition that can be difficult to treat. Consider joining a chronic pain support group. This information is not intended to replace advice given to you by your health care provider. Make sure you discuss any questions you have with your health care provider. Document Revised: 09/27/2020 Document Reviewed: 09/27/2020 Elsevier Patient Education  2024 [employer].

## 2022-10-13 NOTE — Progress Notes (Signed)
BP 134/71   Pulse (!) 51   Temp 97.8 F (36.6 C) (Oral)   Wt 184 lb 6.4 oz (83.6 kg)   SpO2 97%   BMI 24.33 kg/m    Subjective:    Patient ID: Sean Reyes., male    DOB: 12-May-1940, 82 y.o.   MRN: [id number]  HPI: Sean Reyes. is a 82 y.o. male  Chief Complaint  Patient presents with   Pain    Pt states he is having pains in his feet, legs, and hands. States he thinks his neuropathy is getting worse. States he thinks he has also pulled a muscle in his L side recently.    CHRONIC PAIN  At baseline has chronic pain. Has pain stimulator.  Ongoing neuropathy issues at baseline. Saw pain management on 09/21/22 with no changes. At first of week his left hand started getting numb, has to concentrate to get pressure to hold it.  Fingertips have numbness only, not whole hand and not arm.  Reports he is overall not feeling well.  Has chronic pain to feet, legs, and back.  Main concern is his hand and his neuropathy to legs. Saw neurology last 04/28/21, has severe neuropathy and there was discussion in note of monitoring  for Parkinson's.  PD is something patient is concerned about as his neighbor has this and told him his fingertip numbness could be Parkinson's, as she presented with this. Is right hand dominant.  He did recently help put boards on his deck -- last Saturday and thinks he pulled muscle on left back.  Called pain stimulator group and they said they could not help with this.  Does not like muscle relaxer. Pain control status: stable Duration: chronic Location: multiple areas Quality: dull, aching, and throbbing, stabbing Current Pain Level: 5/10 while sitting and 9/10 when moving Previous Pain Level: 10/10 Breakthrough pain: no Benefit from narcotic medications: no What Activities task can be accomplished with current medication? minimal Interested in weaning off narcotics: none taken Stool softners/OTC fiber: no  Previous pain specialty evaluation: yes Non-narcotic  analgesic meds: yes Narcotic contract: no   ATRIAL FIBRILLATION Has underlying and follows with cardiology who saw him last 09/26/22.  They have placed order for 2 week monitor and patient is waiting on this as he feels his heart is not running correctly.  When walks he feels like heart rate is a 100 yard dash.  Continues on Eliquis.  Took him off Metoprolol due to bradycardia. Atrial fibrillation status: stable Satisfied with current treatment: yes  Medication side effects:  no Medication compliance: good compliance Etiology of atrial fibrillation: unknown Palpitations:  yes Chest pain:  no Dyspnea on exertion:  yes Orthopnea:  no Syncope:  no Edema:  no Ventricular rate control:  can not take Anti-coagulation: long acting   Relevant past medical, surgical, family and social history reviewed and updated as indicated. Interim medical history since our last visit reviewed. Allergies and medications reviewed and updated.  Review of Systems  Constitutional:  Negative for activity change, appetite change, diaphoresis, fatigue and fever.  Respiratory:  Negative for cough, chest tightness, shortness of breath and wheezing.   Cardiovascular:  Negative for chest pain, palpitations and leg swelling.  Gastrointestinal: Negative.   Endocrine: Negative for cold intolerance, heat intolerance, polydipsia, polyphagia and polyuria.  Musculoskeletal:  Positive for arthralgias.  Neurological:  Positive for numbness. Negative for dizziness, syncope, facial asymmetry, speech difficulty, weakness and headaches.  Psychiatric/Behavioral: Negative.  Per HPI unless specifically indicated above     Objective:    BP 134/71   Pulse (!) 51   Temp 97.8 F (36.6 C) (Oral)   Wt 184 lb 6.4 oz (83.6 kg)   SpO2 97%   BMI 24.33 kg/m   Wt Readings from Last 3 Encounters:  10/13/22 184 lb 6.4 oz (83.6 kg)  10/11/22 185 lb 12.8 oz (84.3 kg)  10/03/22 185 lb 12.8 oz (84.3 kg)    Physical Exam Vitals  and nursing note reviewed.  Constitutional:      General: He is awake. He is not in acute distress.    Appearance: He is well-developed and well-groomed. He is not ill-appearing.  HENT:     Head: Normocephalic and atraumatic.     Right Ear: Hearing and external ear normal. No drainage.     Left Ear: Hearing and external ear normal. No drainage.  Eyes:     General: Lids are normal.        Right eye: No discharge.        Left eye: No discharge.     Conjunctiva/sclera: Conjunctivae normal.     Pupils: Pupils are equal, round, and reactive to light.  Neck:     Thyroid: No thyromegaly.     Vascular: No carotid bruit.     Trachea: Trachea normal.  Cardiovascular:     Rate and Rhythm: Regular rhythm. Bradycardia present.     Heart sounds: Normal heart sounds, S1 normal and S2 normal. No murmur heard.    No gallop.  Pulmonary:     Effort: Pulmonary effort is normal. No accessory muscle usage or respiratory distress.     Breath sounds: Normal breath sounds.  Abdominal:     General: Bowel sounds are normal.     Palpations: Abdomen is soft.  Musculoskeletal:        General: Normal range of motion.     Cervical back: Normal range of motion and neck supple.     Right lower leg: No edema.     Left lower leg: No edema.  Lymphadenopathy:     Cervical: No cervical adenopathy.  Skin:    General: Skin is warm and dry.     Capillary Refill: Capillary refill takes less than 2 seconds.  Neurological:     Mental Status: He is alert and oriented to person, place, and time.     Cranial Nerves: Cranial nerves 2-12 are intact.     Sensory: Sensory deficit (to left fingertips only, mild decrease) present.     Motor: Motor function is intact.     Coordination: Coordination is intact.     Gait: Gait abnormal (antalgic per baseline).     Deep Tendon Reflexes:     Reflex Scores:      Patellar reflexes are 1+ on the right side and 1+ on the left side.      Achilles reflexes are 1+ on the right side  and 1+ on the left side.    Comments: Antalgic gait, using cane. Mild resting hand tremor bilaterally and slightly mild cogwheel bilaterally.  Psychiatric:        Attention and Perception: Attention normal.        Mood and Affect: Mood normal.        Speech: Speech normal.        Behavior: Behavior normal. Behavior is cooperative.        Thought Content: Thought content normal.     Results for orders  placed or performed in visit on 10/11/22  Ferritin  Result Value Ref Range   Ferritin 65 24 - 336 ng/mL  Iron and TIBC  Result Value Ref Range   Iron 57 45 - 182 ug/dL   TIBC 440 102 - 725 ug/dL   Saturation Ratios 16 (L) 17.9 - 39.5 %   UIBC 290 ug/dL  CMP (Cancer Center only)  Result Value Ref Range   Sodium 131 (L) 135 - 145 mmol/L   Potassium 4.3 3.5 - 5.1 mmol/L   Chloride 99 98 - 111 mmol/L   CO2 25 22 - 32 mmol/L   Glucose, Bld 217 (H) 70 - 99 mg/dL   BUN 23 8 - 23 mg/dL   Creatinine 3.66 4.40 - 1.24 mg/dL   Calcium 9.2 8.9 - 34.7 mg/dL   Total Protein 7.3 6.5 - 8.1 g/dL   Albumin 3.9 3.5 - 5.0 g/dL   AST 17 15 - 41 U/L   ALT 14 0 - 44 U/L   Alkaline Phosphatase 68 38 - 126 U/L   Total Bilirubin 0.4 0.3 - 1.2 mg/dL   GFR, Estimated 59 (L) >60 mL/min   Anion gap 7 5 - 15  CBC with Differential (Cancer Center Only)  Result Value Ref Range   WBC Count 7.1 4.0 - 10.5 K/uL   RBC 3.68 (L) 4.22 - 5.81 MIL/uL   Hemoglobin 11.1 (L) 13.0 - 17.0 g/dL   HCT 42.5 (L) 95.6 - 38.7 %   MCV 91.0 80.0 - 100.0 fL   MCH 30.2 26.0 - 34.0 pg   MCHC 33.1 30.0 - 36.0 g/dL   RDW 56.4 33.2 - 95.1 %   Platelet Count 135 (L) 150 - 400 K/uL   nRBC 0.0 0.0 - 0.2 %   Neutrophils Relative % 65 %   Neutro Abs 4.6 1.7 - 7.7 K/uL   Lymphocytes Relative 23 %   Lymphs Abs 1.7 0.7 - 4.0 K/uL   Monocytes Relative 12 %   Monocytes Absolute 0.8 0.1 - 1.0 K/uL   Eosinophils Relative 0 %   Eosinophils Absolute 0.0 0.0 - 0.5 K/uL   Basophils Relative 0 %   Basophils Absolute 0.0 0.0 - 0.1 K/uL    Immature Granulocytes 0 %   Abs Immature Granulocytes 0.02 0.00 - 0.07 K/uL      Assessment & Plan:   Problem List Items Addressed This Visit       Cardiovascular and Mediastinum   AF (paroxysmal atrial fibrillation) (HCC) - Primary    Chronic, ongoing.  Followed by cardiology at this time.  Continue current medication regimen and collaboration, appreciate their input.  HR regular today with stable rate on exam, on bradycardic side per baseline.        Other   Chronic pain syndrome    Chronic, ongoing.  Followed by neurosurgery and pain management as needed.  Continue this collaboration and current regimen. Lidocaine patches present at home.      Finger numbness    To left fingertips only, not full fingers or hand + none down arm.  Overall stable neuro exam, although fingertips left hand slight decrease sensation.  He is concerned about having Parkinson's/  Discussed with him to schedule follow-up with neurology, he missed this.  Provided him with number to call and he plans on calling Tuesday.  Recommend he start taking Alpha Lipoic Acid as Dr. Sherryll Burger had recommended, wrote this down for him.  No red flags on neuro exam today.  Follow up plan: No follow-ups on file.

## 2022-10-13 NOTE — Telephone Encounter (Signed)
Appointment has been made

## 2022-10-13 NOTE — Assessment & Plan Note (Signed)
Chronic, ongoing.  Followed by neurosurgery and pain management as needed.  Continue this collaboration and current regimen. Lidocaine patches present at home.

## 2022-10-17 DIAGNOSIS — G903 Multi-system degeneration of the autonomic nervous system: Secondary | ICD-10-CM | POA: Diagnosis not present

## 2022-10-17 DIAGNOSIS — E1169 Type 2 diabetes mellitus with other specified complication: Secondary | ICD-10-CM | POA: Diagnosis not present

## 2022-10-17 DIAGNOSIS — E785 Hyperlipidemia, unspecified: Secondary | ICD-10-CM | POA: Diagnosis not present

## 2022-10-17 DIAGNOSIS — R531 Weakness: Secondary | ICD-10-CM | POA: Diagnosis not present

## 2022-10-17 DIAGNOSIS — E569 Vitamin deficiency, unspecified: Secondary | ICD-10-CM | POA: Diagnosis not present

## 2022-10-17 DIAGNOSIS — R29898 Other symptoms and signs involving the musculoskeletal system: Secondary | ICD-10-CM | POA: Diagnosis not present

## 2022-10-18 DIAGNOSIS — H04413 Chronic dacryocystitis of bilateral lacrimal passages: Secondary | ICD-10-CM | POA: Diagnosis not present

## 2022-10-18 DIAGNOSIS — E119 Type 2 diabetes mellitus without complications: Secondary | ICD-10-CM | POA: Diagnosis not present

## 2022-10-18 LAB — HM DIABETES EYE EXAM

## 2022-10-19 ENCOUNTER — Other Ambulatory Visit: Payer: Medicare Other

## 2022-10-19 ENCOUNTER — Other Ambulatory Visit: Payer: Self-pay

## 2022-10-19 ENCOUNTER — Telehealth: Payer: Medicare Other

## 2022-10-19 NOTE — Patient Outreach (Signed)
Care Management   Visit Note  10/19/2022 Name: Sean Reyes. MRN: [id number] DOB: 07-14-1940  Subjective: Sean Reyes. is a 82 y.o. year old male who is a primary care patient of [name], Dorie Rank, NP. The Care Management team was consulted for assistance.      Engaged with patient spoke with patient by telephone.    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 The patient states that he has seen the pain specialist and is considering other options but is having issues now with his AFIB. He doesn't know if his not being able to walk well is causing issues with his heart. He feels that he is not doing well and really doesn't understand why all these things are going on. He just wants answers.  Reviewed medications with patient and discussed importance of medication adherence. The patient is compliant with medications;        Reviewed prescribed diet with patient heart healthy/ADA diet. Review of heart healthy/ADA diet. The patient says his swallowing may be a little better but not a lot. 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 The  patient has regular lab work. Reviewed with the patient that his A1C is up from 6.5 to 7.0. Education on monitoring for acute changes and calling the pcp for recommendations.;        Discussed plans with patient for ongoing care management follow up and provided patient with direct contact information for care management team. Is appreciative of the CCM team calling and checking on him and helping him with his health and well being;      Provided patient with written educational materials related to hypo and hyperglycemia and importance of correct treatment;       Reviewed scheduled/upcoming provider appointments including: Saw pcp on 10-13-2022 and has follow up in November, may need to see the pcp sooner. Education provided.  Advised patient, providing education and rationale, to check cbg twice daily and when you have symptoms of low or high blood sugar and record. Denies any acute changes in his blood sugars.       call provider for findings outside established parameters;       Review of patient status, including review of consultants reports, relevant laboratory and other test results, and medications completed;       Advised patient to discuss changes in her DM health and well being with provider;      Screening for signs and symptoms of depression related to chronic disease state;        Assessed social determinant  of health barriers;         Symptom Management: Take medications as prescribed   Attend all scheduled provider appointments Call provider office for new concerns or questions  call the Suicide and Crisis Lifeline: 988 call the [place] National Suicide Prevention Lifeline: (512)433-0578 or TTY: (956)740-5302 TTY (567)410-7882) to talk to a trained counselor call 1-800-273-TALK (toll free, 24 hour hotline) if experiencing a Mental Health or Behavioral Health Crisis  check feet daily for cuts, sores or redness trim toenails straight across manage portion size wash and dry feet  carefully every day wear comfortable, cotton socks wear comfortable, well-fitting shoes  Follow Up Plan: Telephone follow up appointment with care management team member scheduled for: 11-23-2022 at 230 pm       RNCM Care Management Expected Outcome:  Monitor, Self-Manage and Reduce Symptoms of: CKD       Current Barriers:  Chronic Disease Management support and education needs related to effective management of CKD Lab Results  Component Value Date   CREATININE 1.23 10/11/2022   CREATININE 1.18 10/03/2022   CREATININE 1.23 07/03/2022    Lab Results  Component Value Date   CREATININE 1.23 10/11/2022   BUN 23 10/11/2022   NA 131 (L) 10/11/2022   K 4.3 10/11/2022   CL 99 10/11/2022   CO2 25 10/11/2022     Planned Interventions: Assessed the patient understanding of chronic kidney disease. The patient had labs. Reviewed labs with the patient. Denies any acute changes in his CKD or kidney function. States that his kidney function is good.   The patient has stable kidney function at this time. Evaluation of current treatment plan related to chronic kidney disease self management and patient's adherence to plan as established by provider.       Provided education to patient re: stroke prevention, s/s of heart attack and stroke    Reviewed prescribed diet heart healthy/ADA diet. The patient is compliant with dietary restrictions. The patient states he is doing well and watching what he eats. Reviewed medications with patient and discussed importance of compliance. The patient is compliant with medications    Advised patient, providing education and rationale, to monitor blood pressure daily and record, calling PCP for findings outside established parameters    Discussed complications of poorly controlled blood pressure such as heart disease, stroke, circulatory complications, vision complications, kidney impairment, sexual dysfunction    Reviewed scheduled/upcoming provider appointments  including: 10-03-2022 at 1120 am  Advised patient to discuss changes in his kidney function, or questions and concerns related to CKD with provider    Discussed plans with patient for ongoing care management follow up and provided patient with direct contact information for care management team    Screening for signs and symptoms of depression related to chronic disease state      Discussed the impact of chronic kidney disease on daily life and mental health and acknowledged and normalized feelings of disempowerment, fear, and frustration    Assessed social determinant of health barriers    Provided education on kidney disease progression    Engage patient in early, proactive and ongoing discussion about goals of care and what matters most to them    Support coping and stress management by recognizing current strategies and assist in developing new strategies such as mindfulness, journaling, relaxation techniques, problem-solving. The patient is still dealing with ongoing chronic back pain and leg weakness. Sees provider on a regular basis. Denies falls. The patients daughter had back surgery today and  he has been up since 330 pm. The patient was tired. The patient states his back pain is no better. Denies falls but states his legs are weak. Education given.  Take medications as prescribed   Attend all scheduled provider appointments Call provider office for new concerns or questions  call the Suicide and Crisis Lifeline: 988 call the [place] National Suicide Prevention Lifeline: 339-005-4554 or TTY: 718-041-9313 TTY 4705838118) to talk to a trained counselor call 1-800-273-TALK (toll free, 24 hour hotline) if experiencing a Mental Health or Behavioral Health Crisis   Follow Up Plan: Telephone follow up appointment with care management team member scheduled for: 11-23-2022 at 230 pm       RNCM Care Management Expected Outcome:  Monitor, Self-Manage, and Reduce Symptoms of Hypertension        Current Barriers:  Knowledge Deficits related to fluctuations in blood pressures and the need to have normalized blood pressures to prevent the risk of heart attack and stroke Chronic Disease Management support and education needs related to effective management of HTN BP Readings from Last 3 Encounters:  10/13/22 134/71  10/11/22 (!) 140/76  10/11/22 (!) 126/51     Planned Interventions: Evaluation of current treatment plan related to hypertension self management and patient's adherence to plan as established by provider. The patient saw the cardiologist recently. He was stable at the appointment but since the patient had his appointment he has been having repeat episodes of AFIB. He has called the office and is waiting on a monitor to wear to evaluate episodes of AFIB. He is concerned about his overall health and well being right now. Reflective listening and support given.  Provided education to patient re: stroke prevention, s/s of heart attack and stroke; Reviewed prescribed diet heart healthy/ADA diet. The patient is compliant with dietary restrictions. The patient has decided to stay away from [employer] salads.  Reviewed medications with patient and discussed importance of compliance. The patient states compliance with medications.  Discussed plans with patient for ongoing care management follow up and provided patient with direct contact information for care management team; Advised patient, providing education and rationale, to monitor blood pressure daily and record, calling PCP for findings outside established parameters.   Reviewed scheduled/upcoming provider appointments including:  next pcp appointment on 01-03-2023 at 1020 am, saw on 10-13-2022 and may need to see sooner Advised patient to discuss changes in his HTN, or heart health  with provider; Provided education on prescribed diet Heart healthy/ADA diet ;  Discussed complications of poorly controlled blood pressure such as heart  disease, stroke, circulatory complications, vision complications, kidney impairment, sexual dysfunction;  Screening for signs and symptoms of depression related to chronic disease state;  Assessed social determinant of health barriers;  The patient is having a lot of different things going on . Is receiving iron infusions and is going to see specialist. His biggest concern right now is his leg weakness, back pain, and AFIB  Symptom Management: Take medications as prescribed   Attend all scheduled provider appointments Call provider office for new concerns or questions  call the Suicide and Crisis Lifeline: 988 call the [place] National Suicide Prevention Lifeline: 604-356-2270 or TTY: 661-638-8841 TTY (612)470-6181) to talk to a trained counselor call 1-800-273-TALK (toll free, 24 hour hotline) if experiencing a Mental Health or Behavioral Health Crisis  check blood pressure 3 times per week learn about high blood pressure keep a blood pressure log take blood pressure log to all doctor appointments call doctor for signs  and symptoms of high blood pressure develop an action plan for high blood pressure keep all doctor appointments take medications for blood pressure exactly as prescribed report new symptoms to your doctor  Follow Up Plan: Telephone follow up appointment with care management team member scheduled for: 11-23-2022 at 230 pm           Consent to Services:  Patient was given information about care management services, agreed to services, and gave verbal consent to participate.   Plan: Telephone follow up appointment with care management team member scheduled for: 11-23-2022 at 230 pm  Alto Denver RN, MSN, CCM RN Care Manager  [facility]  Ambulatory Care Management  Direct Number: [phone]

## 2022-10-19 NOTE — Patient Instructions (Signed)
Visit Information  Thank you for taking time to visit with me today. Please don't hesitate to contact me if I can be of assistance to you before our next scheduled telephone appointment.  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 The patient states that he has seen the pain specialist and is considering other options but is having issues now with his AFIB. He doesn't know if his not being able to walk well is causing issues with his heart. He feels that he is not doing well and really doesn't understand why all these things are going on. He just wants answers.  Reviewed medications with patient and discussed importance of medication adherence. The patient is compliant with medications;        Reviewed prescribed diet with patient heart healthy/ADA diet. Review of heart healthy/ADA diet. The patient says his swallowing may be a little better but not a lot. 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 The patient has regular lab work. Reviewed with the patient that his A1C is up from 6.5 to 7.0.  Education on monitoring for acute changes and calling the pcp for recommendations.;        Discussed plans with patient for ongoing care management follow up and provided patient with direct contact information for care management team. Is appreciative of the CCM team calling and checking on him and helping him with his health and well being;      Provided patient with written educational materials related to hypo and hyperglycemia and importance of correct treatment;       Reviewed scheduled/upcoming provider appointments including: Saw pcp on 10-13-2022 and has follow up in November, may need to see the pcp sooner. Education provided.  Advised patient, providing education and rationale, to check cbg twice daily and when you have symptoms of low or high blood sugar and record. Denies any acute changes in his blood sugars.       call provider for findings outside established parameters;       Review of patient status, including review of consultants reports, relevant laboratory and other test results, and medications completed;       Advised patient to discuss changes in her DM health and well being with provider;      Screening for signs and symptoms of depression related to chronic disease state;        Assessed social determinant of health barriers;         Symptom Management: Take medications as prescribed   Attend  all scheduled provider appointments Call provider office for new concerns or questions  call the Suicide and Crisis Lifeline: 988 call the [place] National Suicide Prevention Lifeline: 779-356-5425 or TTY: 385-879-9591 TTY [phone]) to talk to a trained counselor call 1-800-273-TALK (toll free, 24 hour hotline) if experiencing a Mental Health or Behavioral Health Crisis  check feet daily for cuts, sores or redness trim toenails straight across manage portion size wash and dry feet carefully every day wear comfortable, cotton socks wear comfortable, well-fitting  shoes  Follow Up Plan: Telephone follow up appointment with care management team member scheduled for: 11-23-2022 at 230 pm       RNCM Care Management Expected Outcome:  Monitor, Self-Manage and Reduce Symptoms of: CKD       Current Barriers:  Chronic Disease Management support and education needs related to effective management of CKD Lab Results  Component Value Date   CREATININE 1.23 10/11/2022   CREATININE 1.18 10/03/2022   CREATININE 1.23 07/03/2022    Lab Results  Component Value Date   CREATININE 1.23 10/11/2022   BUN 23 10/11/2022   NA 131 (L) 10/11/2022   K 4.3 10/11/2022   CL 99 10/11/2022   CO2 25 10/11/2022     Planned Interventions: Assessed the patient understanding of chronic kidney disease. The patient had labs. Reviewed labs with the patient. Denies any acute changes in his CKD or kidney function. States that his kidney function is good.   The patient has stable kidney function at this time. Evaluation of current treatment plan related to chronic kidney disease self management and patient's adherence to plan as established by provider.       Provided education to patient re: stroke prevention, s/s of heart attack and stroke    Reviewed prescribed diet heart healthy/ADA diet. The patient is compliant with dietary restrictions. The patient states he is doing well and watching what he eats. Reviewed medications with patient and discussed importance of compliance. The patient is compliant with medications    Advised patient, providing education and rationale, to monitor blood pressure daily and record, calling PCP for findings outside established parameters    Discussed complications of poorly controlled blood pressure such as heart disease, stroke, circulatory complications, vision complications, kidney impairment, sexual dysfunction    Reviewed scheduled/upcoming provider appointments including: 10-03-2022 at 1120 am  Advised patient to discuss changes in his kidney  function, or questions and concerns related to CKD with provider    Discussed plans with patient for ongoing care management follow up and provided patient with direct contact information for care management team    Screening for signs and symptoms of depression related to chronic disease state      Discussed the impact of chronic kidney disease on daily life and mental health and acknowledged and normalized feelings of disempowerment, fear, and frustration    Assessed social determinant of health barriers    Provided education on kidney disease progression    Engage patient in early, proactive and ongoing discussion about goals of care and what matters most to them    Support coping and stress management by recognizing current strategies and assist in developing new strategies such as mindfulness, journaling, relaxation techniques, problem-solving. The patient is still dealing with ongoing chronic back pain and leg weakness. Sees provider on a regular basis. Denies falls. The patients daughter had back surgery today and he has been up since 330 pm. The patient was tired. The patient states his back pain is no better.  Denies falls but states his legs are weak. Education given.  Take medications as prescribed   Attend all scheduled provider appointments Call provider office for new concerns or questions  call the Suicide and Crisis Lifeline: 988 call the [place] National Suicide Prevention Lifeline: 779-440-8616 or TTY: 9066035551 TTY 916-036-0975) to talk to a trained counselor call 1-800-273-TALK (toll free, 24 hour hotline) if experiencing a Mental Health or Behavioral Health Crisis   Follow Up Plan: Telephone follow up appointment with care management team member scheduled for: 11-23-2022 at 230 pm       RNCM Care Management Expected Outcome:  Monitor, Self-Manage, and Reduce Symptoms of Hypertension       Current Barriers:  Knowledge Deficits related to fluctuations in blood pressures and  the need to have normalized blood pressures to prevent the risk of heart attack and stroke Chronic Disease Management support and education needs related to effective management of HTN BP Readings from Last 3 Encounters:  10/13/22 134/71  10/11/22 (!) 140/76  10/11/22 (!) 126/51     Planned Interventions: Evaluation of current treatment plan related to hypertension self management and patient's adherence to plan as established by provider. The patient saw the cardiologist recently. He was stable at the appointment but since the patient had his appointment he has been having repeat episodes of AFIB. He has called the office and is waiting on a monitor to wear to evaluate episodes of AFIB. He is concerned about his overall health and well being right now. Reflective listening and support given.  Provided education to patient re: stroke prevention, s/s of heart attack and stroke; Reviewed prescribed diet heart healthy/ADA diet. The patient is compliant with dietary restrictions. The patient has decided to stay away from [employer] sal[employer].  Reviewed medications with patient and discussed importance of compliance. The patient states compliance with medications.  Discussed plans with patient for ongoing care management follow up and provided patient with direct contact information for care management team; Advised patient, providing education and rationale, to monitor blood pressure daily and record, calling PCP for findings outside established parameters.   Reviewed scheduled/upcoming provider appointments including:  next pcp appointment on 01-03-2023 at 1020 am, saw on 10-13-2022 and may need to see sooner Advised patient to discuss changes in his HTN, or heart health  with provider; Provided education on prescribed diet Heart healthy/ADA diet ;  Discussed complications of poorly controlled blood pressure such as heart disease, stroke, circulatory complications, vision complications, kidney impairment,  sexual dysfunction;  Screening for signs and symptoms of depression related to chronic disease state;  Assessed social determinant of health barriers;  The patient is having a lot of different things going on . Is receiving iron infusions and is going to see specialist. His biggest concern right now is his leg weakness, back pain, and AFIB  Symptom Management: Take medications as prescribed   Attend all scheduled provider appointments Call provider office for new concerns or questions  call the Suicide and Crisis Lifeline: 988 call the [place] National Suicide Prevention Lifeline: 804-307-8526 or TTY: 5613244211 TTY [phone]) to talk to a trained counselor call 1-800-273-TALK (toll free, 24 hour hotline) if experiencing a Mental Health or Behavioral Health Crisis  check blood pressure 3 times per week learn about high blood pressure keep a blood pressure log take blood pressure log to all doctor appointments call doctor for signs and symptoms of high blood pressure develop an action plan for high blood pressure keep all doctor appointments take medications  for blood pressure exactly as prescribed report new symptoms to your doctor  Follow Up Plan: Telephone follow up appointment with care management team member scheduled for: 11-23-2022 at 230 pm           Our next appointment is by telephone on 11-23-2022 at 230 pm  Please call the care guide team at [phone] if you need to cancel or reschedule your appointment.   If you are experiencing a Mental Health or Behavioral Health Crisis or need someone to talk to, please call the Suicide and Crisis Lifeline: 988 call the [place] National Suicide Prevention Lifeline: 705-374-1924 or TTY: 670-351-5186 TTY 8104080971) to talk to a trained counselor call 1-800-273-TALK (toll free, 24 hour hotline)   Patient verbalizes understanding of instructions and care plan provided today and agrees to view in MyChart. Active MyChart  status and patient understanding of how to access instructions and care plan via MyChart confirmed with patient.     Telephone follow up appointment with care management team member scheduled for: 11-23-2022 at 230 pm  Alto Denver RN, MSN, CCM RN Care Manager  [facility]  Ambulatory Care Management  Direct Number: [phone]

## 2022-10-20 ENCOUNTER — Encounter: Payer: Self-pay | Admitting: Nurse Practitioner

## 2022-10-20 ENCOUNTER — Ambulatory Visit: Payer: Medicare Other | Admitting: Dietician

## 2022-10-20 ENCOUNTER — Ambulatory Visit: Payer: Medicare Other | Attending: Cardiovascular Disease | Admitting: Cardiovascular Disease

## 2022-10-20 DIAGNOSIS — I480 Paroxysmal atrial fibrillation: Secondary | ICD-10-CM

## 2022-10-20 DIAGNOSIS — I2510 Atherosclerotic heart disease of native coronary artery without angina pectoris: Secondary | ICD-10-CM

## 2022-10-20 NOTE — Addendum Note (Signed)
Addended by: Parke Poisson on: 10/20/2022 12:04 PM   Modules accepted: Orders

## 2022-10-23 NOTE — Progress Notes (Signed)
Not an office visit. Zio monitor placement.

## 2022-10-27 ENCOUNTER — Inpatient Hospital Stay: Payer: Medicare Other | Attending: Internal Medicine

## 2022-10-27 VITALS — BP 130/62 | HR 42 | Temp 98.1°F

## 2022-10-27 DIAGNOSIS — D509 Iron deficiency anemia, unspecified: Secondary | ICD-10-CM | POA: Diagnosis not present

## 2022-10-27 DIAGNOSIS — N183 Chronic kidney disease, stage 3 unspecified: Secondary | ICD-10-CM | POA: Insufficient documentation

## 2022-10-27 DIAGNOSIS — Z23 Encounter for immunization: Secondary | ICD-10-CM | POA: Insufficient documentation

## 2022-10-27 DIAGNOSIS — D649 Anemia, unspecified: Secondary | ICD-10-CM

## 2022-10-27 DIAGNOSIS — D631 Anemia in chronic kidney disease: Secondary | ICD-10-CM | POA: Diagnosis not present

## 2022-10-27 MED ORDER — SODIUM CHLORIDE 0.9 % IV SOLN
200.0000 mg | Freq: Once | INTRAVENOUS | Status: AC
  Administered 2022-10-27: 200 mg via INTRAVENOUS
  Filled 2022-10-27: qty 200

## 2022-10-27 MED ORDER — SODIUM CHLORIDE 0.9 % IV SOLN
Freq: Once | INTRAVENOUS | Status: AC
  Filled 2022-10-27: qty 250

## 2022-10-27 NOTE — Patient Instructions (Signed)
Iron Sucrose Injection What is this medication? IRON SUCROSE (EYE ern SOO krose) treats low levels of iron (iron deficiency anemia) in people with kidney disease. Iron is a mineral that plays an important role in making red blood cells, which carry oxygen from your lungs to the rest of your body. This medicine may be used for other purposes; ask your health care provider or pharmacist if you have questions. COMMON BRAND NAME(S): Venofer What should I tell my care team before I take this medication? They need to know if you have any of these conditions: Anemia not caused by low iron levels Heart disease High levels of iron in the blood Kidney disease Liver disease An unusual or allergic reaction to iron, other medications, foods, dyes, or preservatives Pregnant or trying to get pregnant Breastfeeding How should I use this medication? This medication is for infusion into a vein. It is given in a hospital or clinic setting. Talk to your care team about the use of this medication in children. While this medication may be prescribed for children as young as 2 years for selected conditions, precautions do apply. Overdosage: If you think you have taken too much of this medicine contact a poison control center or emergency room at once. NOTE: This medicine is only for you. Do not share this medicine with others. What if I miss a dose? Keep appointments for follow-up doses. It is important not to miss your dose. Call your care team if you are unable to keep an appointment. What may interact with this medication? Do not take this medication with any of the following: Deferoxamine Dimercaprol Other iron products This medication may also interact with the following: Chloramphenicol Deferasirox This list may not describe all possible interactions. Give your health care provider a list of all the medicines, herbs, non-prescription drugs, or dietary supplements you use. Also tell them if you smoke,  drink alcohol, or use illegal drugs. Some items may interact with your medicine. What should I watch for while using this medication? Visit your care team regularly. Tell your care team if your symptoms do not start to get better or if they get worse. You may need blood work done while you are taking this medication. You may need to follow a special diet. Talk to your care team. Foods that contain iron include: whole grains/cereals, dried fruits, beans, or peas, leafy green vegetables, and organ meats (liver, kidney). What side effects may I notice from receiving this medication? Side effects that you should report to your care team as soon as possible: Allergic reactions--skin rash, itching, hives, swelling of the face, lips, tongue, or throat Low blood pressure--dizziness, feeling faint or lightheaded, blurry vision Shortness of breath Side effects that usually do not require medical attention (report to your care team if they continue or are bothersome): Flushing Headache Joint pain Muscle pain Nausea Pain, redness, or irritation at injection site This list may not describe all possible side effects. Call your doctor for medical advice about side effects. You may report side effects to FDA at 1-800-FDA-1088. Where should I keep my medication? This medication is given in a hospital or clinic. It will not be stored at home. NOTE: This sheet is a summary. It may not cover all possible information. If you have questions about this medicine, talk to your doctor, pharmacist, or health care provider.  2024 Elsevier/Gold Standard (2022-07-07 00:00:00)

## 2022-11-02 ENCOUNTER — Ambulatory Visit: Payer: Medicare Other | Attending: Neurology

## 2022-11-02 DIAGNOSIS — M6281 Muscle weakness (generalized): Secondary | ICD-10-CM | POA: Diagnosis not present

## 2022-11-02 DIAGNOSIS — R262 Difficulty in walking, not elsewhere classified: Secondary | ICD-10-CM | POA: Insufficient documentation

## 2022-11-02 NOTE — Therapy (Addendum)
OUTPATIENT PHYSICAL THERAPY BALANCE EVALUATION   Patient Name: Sean Reyes. MRN: [id number] DOB:23-Nov-1940, 82 y.o., male Today's Date: 11/03/2022  END OF SESSION:  PT End of Session - 11/02/22 1433     Visit Number 1    Number of Visits 17    Date for PT Re-Evaluation 12/28/22    Authorization Type eval: 11/02/2022    PT Start Time 1105    PT Stop Time 1150    PT Time Calculation (min) 45 min    Equipment Utilized During Treatment Gait belt    Activity Tolerance Patient tolerated treatment well    Behavior During Therapy WFL for tasks assessed/performed             Past Medical History:  Diagnosis Date   Anemia    Anxiety    Arthritis    Arthritis of neck    Atrial fibrillation (HCC)    Cataracts, bilateral    Complication of anesthesia    pt reports low BP's after surgery at [facility] and difficulty awakening   Depression    Diabetes (HCC)    dx 6-8 yrs ago   Dysrhythmia    a-fib   GERD (gastroesophageal reflux disease)    OCC TAKES ALKA SELTZER   History of kidney stones    10-15 yrs ago   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   Hyperlipidemia    Hypertension    Myocardial infarction (HCC) 12/2020   Nocturia    S/P ablation of atrial fibrillation    Ablative therapy   Sleep apnea    CPAP    Spinal stenosis    Tachycardia, unspecified    Past Surgical History:  Procedure Laterality Date   ABLATION     ANTERIOR LAT LUMBAR FUSION N/A 06/27/2017   Procedure: Anterior Lateral Lumbar Interbody  Fusion - Lumbar Two-Lumbar Three - Lumbar Three-Lumbar Four, Posterior Lumbar Interbody Fusion Lumbar Four- Five;  Surgeon: Donalee Citrin, MD;  Location: [facility] OR;  Service: Neurosurgery;  Laterality: N/A;  Anterior Lateral Lumbar Interbody  Fusion - Lumbar Two-Lumbar Three - Lumbar Three-Lumbar Four, Posterior Lumbar Interbody Fusion Lumbar Four- Five   BACK SURGERY     CARDIAC CATHETERIZATION     CARDIOVERSION N/A 08/29/2018   Procedure:  CARDIOVERSION;  Surgeon: Lamar Blinks, MD;  Location: [facility] ORS;  Service: Cardiovascular;  Laterality: N/A;   CARDIOVERSION N/A 09/24/2018   Procedure: CARDIOVERSION;  Surgeon: Lamar Blinks, MD;  Location: [facility] ORS;  Service: Cardiovascular;  Laterality: N/A;   COLONOSCOPY WITH PROPOFOL N/A 10/05/2015   Procedure: COLONOSCOPY WITH PROPOFOL;  Surgeon: Christena Deem, MD;  Location: [facility] ENDOSCOPY;  Service: Endoscopy;  Laterality: N/A;   COLONOSCOPY WITH PROPOFOL N/A 11/01/2020   Procedure: COLONOSCOPY WITH PROPOFOL;  Surgeon: Wyline Mood, MD;  Location: [facility] ENDOSCOPY;  Service: Gastroenterology;  Laterality: N/A;   CORONARY STENT INTERVENTION N/A 12/22/2020   Procedure: CORONARY STENT INTERVENTION;  Surgeon: Marcina Millard, MD;  Location: [facility] INVASIVE CV LAB;  Service: Cardiovascular;  Laterality: N/A;   ESOPHAGOGASTRODUODENOSCOPY N/A 11/01/2020   Procedure: ESOPHAGOGASTRODUODENOSCOPY (EGD);  Surgeon: Wyline Mood, MD;  Location: [facility] ENDOSCOPY;  Service: Gastroenterology;  Laterality: N/A;   ESOPHAGOGASTRODUODENOSCOPY (EGD) WITH PROPOFOL N/A 04/01/2018   Procedure: ESOPHAGOGASTRODUODENOSCOPY (EGD) WITH PROPOFOL;  Surgeon: Christena Deem, MD;  Location: [facility] ENDOSCOPY;  Service: Endoscopy;  Laterality: N/A;   ESOPHAGOGASTRODUODENOSCOPY (EGD) WITH PROPOFOL N/A 06/01/2022   Procedure: ESOPHAGOGASTRODUODENOSCOPY (EGD) WITH PROPOFOL;  Surgeon: Wyline Mood, MD;  Location: [facility]  ENDOSCOPY;  Service: Gastroenterology;  Laterality: N/A;   EYE SURGERY     HERNIA REPAIR     JOINT REPLACEMENT Bilateral    hips  RT+  LEFT X2    LEFT HEART CATH AND CORONARY ANGIOGRAPHY N/A 12/22/2020   Procedure: LEFT HEART CATH AND CORONARY ANGIOGRAPHY;  Surgeon: Marcina Millard, MD;  Location: [facility] INVASIVE CV LAB;  Service: Cardiovascular;  Laterality: N/A;   LEFT HEART CATH AND CORONARY ANGIOGRAPHY Left 08/23/2021   Procedure: LEFT HEART CATH AND CORONARY ANGIOGRAPHY;  Surgeon: Lamar Blinks, MD;  Location: [facility] INVASIVE CV LAB;  Service: Cardiovascular;  Laterality: Left;   LUMBAR LAMINECTOMY/DECOMPRESSION MICRODISCECTOMY Left 09/13/2016   Procedure: Microdiscectomy - Lumbar two-three,  Lumbar three- - left;  Surgeon: Donalee Citrin, MD;  Location: [facility] OR;  Service: Neurosurgery;  Laterality: Left;   SPINAL CORD STIMULATOR INSERTION  07/08/2019   TONSILLECTOMY     Patient Active Problem List   Diagnosis Date Noted   Finger numbness 10/13/2022   Subareolar mass of left breast 10/03/2022   COPD (chronic obstructive pulmonary disease) (HCC) 10/31/2021   Shortness of breath on exertion 08/04/2021   Diabetes mellitus with proteinuria (HCC) 04/04/2021   GERD without esophagitis 04/02/2021   Coronary artery disease 12/28/2020   History of non-ST elevation myocardial infarction (NSTEMI) 12/21/2020   Pain in right shin 10/25/2020   Peripheral vascular disease (HCC) 08/06/2020   Chronic pain syndrome 07/06/2020   Cervical facet joint syndrome 04/08/2020   Spinal cord stimulator status 12/11/2019   Elevated TSH 08/20/2019   CKD (chronic kidney disease) stage 3, GFR 30-59 ml/min (HCC) 01/19/2019   Acquired thrombophilia (HCC) 01/19/2019   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   Postlaminectomy syndrome, lumbar region 01/16/2019   History of fusion of lumbar spine (L2-L5) 01/16/2019   Chronic radicular lumbar pain 01/16/2019   HNP (herniated nucleus pulposus), lumbar 04/29/2018   Advanced care planning/counseling discussion 09/28/2016   Spinal stenosis, lumbar region, 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/13/2016   Hyperlipidemia associated with type 2 diabetes mellitus (HCC) 07/14/2015   Symptomatic anemia 06/30/2015   Benign prostatic hyperplasia without lower urinary tract symptoms 06/02/2015   OSA (obstructive sleep apnea) 03/23/2015   Hypertension associated with diabetes (HCC) 09/28/2014   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HCC) 09/28/2014   H/O prior ablation  treatment 10/19/2011   AF (paroxysmal atrial fibrillation) (HCC) 10/19/2011    PCP: Marjie Skiff, NP  REFERRING PROVIDER: Lonell Face, MD   REFERRING DIAG: R26.89 (ICD-10-CM) - Balance problem   RATIONALE FOR EVALUATION AND TREATMENT: Rehabilitation  THERAPY DIAG: Muscle weakness (generalized)  Difficulty in walking, not elsewhere classified  ONSET DATE: 2021  FOLLOW-UP APPT SCHEDULED WITH REFERRING PROVIDER: Yes    SUBJECTIVE:  SUBJECTIVE STATEMENT:  Leg pain, Difficulty with Walking, Imbalance   PERTINENT HISTORY:   Per Chart Review:   10/17/2022 "Patient reported he is not doing well lately. He has been seeing Dr. Myer Haff. He reports his legs are so weak that he can hardly walk, so he has been using a crutch for the last 1-2 years. Reported numbness and loss of sensation in his toes. He also reported that recently his left hand has began to has some numbness to where it feels like it is asleep. Denies pain, loss of sensation, coldness, burning. 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 "  Today:  Patient reports to physical therapy today with a chief concern of muscle weakness and imbalance. Patient reports progressive worsening in balance and muscular strength. He states that he has loss of sensation in his toes he reports they are "asleep". Patient reports pain in the anterior thigh and bilateral calves. He  currently uses an loftstand crutch in his RUE. Lately patient has reported difficulty with dressing LE, performing fine motor movements with left hand. He denies falls, saddle parasthesia, nausea, vomitting, night sweats.       Pain: Yes Numbness/Tingling: Yes Focal Weakness: Yes Recent changes in overall health/medication: No Prior history of physical therapy for balance:  Yes Dominant hand: right Imaging: Yes  Red flags: Negative for bowel/bladder changes, saddle paresthesia, personal history of cancer, h/o spinal tumors, h/o compression fx, h/o abdominal aneurysm, abdominal pain, chills/fever, night sweats, nausea, vomiting, unrelenting pain, first onset of insidious LBP <20 y/o  PRECAUTIONS: Fall  WEIGHT BEARING RESTRICTIONS: No  FALLS: Has patient fallen in last 6 months? 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  Recent memory is intact.  Remote memory is intact.  Attention span and concentration are intact.  Expressive speech is intact.  Patient's fund of knowledge is within normal limits for educational level.  Gross Musculoskeletal Assessment Tremor: None Bulk: Normal Tone: Normal  Posture: Rounded forward posture in seated. Standing posture: Forward and rounded in standing,  limited cervicothoracic extension.   AROM Deferred  LE MMT: MMT (out of 5) Right  Left   Hip flexion 4 4-  Hip extension    Hip abduction    Hip adduction    Hip internal rotation 5 4-  Hip external rotation 5 4*  Knee flexion    Knee extension 4+ 4+  Ankle dorsiflexion 4- 4-  Ankle plantarflexion 5 5  Ankle inversion    Ankle eversion    (* = pain; Blank rows =  not tested)  Sensation Grossly intact to light touch throughout bilateral LEs as determined by testing dermatomes L2-S2. Proprioception, stereognosis, and hot/cold testing deferred on this date.  Reflexes R/L Knee Jerk (L3/4): 2+/2+  Ankle Jerk (S1/2): 2+/2+   Cranial Nerves Deferred  Coordination/Cerebellar Finger to Nose: WNL Heel to Shin: Deferred Rapid alternating movements: WNL Finger Opposition: WNL Pronator Drift: Negative  Bed mobility: Deferred   Transfers: Assistive device utilized: None  Sit to stand: Complete Independence Stand to sit: Complete Independence Chair to chair: SBA Slowed, required few attempts at stand before transfer, definite use of hands  Floor: Deferred  Curb:  Deferred  Stairs: Deferred  Gait: Gait pattern: step through pattern Distance walked: 55m Assistive device utilized: Crutches Level of assistance: Complete Independence Comments: Slowed, Cautious gait with use of loftstrand crutch in RUE.   Functional Outcome Measures  Results Comments  BERG 39/56 2,4,4,3,3,4,4,3,3,2,2,2,2,1  DGI    FGA    TUG    5TSTS 16.65s  With UE support, unable to perform without BUE support.   6 Minute Walk Test    10 Meter Gait Speed Self-selected: 18s = .55 m/s;  Loftstrand RUE  (Blank rows = not tested)   TODAY'S TREATMENT  Deferred  PATIENT EDUCATION:  Education details: HEP and Plan of Care Person educated: Patient  Education method: Explanation and Handouts Education comprehension: verbalized understanding   HOME EXERCISE PROGRAM:  Access Code:  [email or web address] URL: [email or web address] Date: 11/02/2022 Prepared by: Ria Comment  Exercises - Seated Long Arc Quad  - 1 x daily - 7 x weekly - 1-3 sets - 10 reps - Heel Raises with Counter Support  - 1 x daily - 7 x weekly - 1-3 sets - 10 reps - Semi-Tandem Balance at [employer] Eyes Open  - 1 x daily - 7 x weekly - 2-3 sets - 10 reps - 10-30 hold   ASSESSMENT:  CLINICAL IMPRESSION: Patient is a pleasant 82 y.o. male who was seen today for physical therapy evaluation and treatment for balance. Patient endorsed independence with all functional mobility and ADLs, however uses a loftstrand crutch for household ambulation and rollator for community ambulation.  Objectively patient scored 39/56 on Berg balance indicating he is at high risk of falls. Patient also demonstrated decreased walking speed (.55 m/s per ) and decreased LE strength (16.65s on 5TSTS). He presented a "white scar" on his RLE that reports pain to light touch. His gait presented with decreased step length, trunk flexed, and shuffled pattern with prolonged distances. Throughout session, patient required multiple seated rest breaks in order to mitigate lumbar pain and SOB. He is unable to perform prolonged standing without report of lower back pain. Walking pattern impaired due to decreased sensation in toes, weakness in LE and pain in lumbar region. Currently, he presents with poor balance, endurance and decreased LE strength thus placing him at a high risk for falls. Based on today's performance patient would benefit from skilled physical therapy focused on improving LE strength and balance in order to demonstrate decreased fall risk improved functional mobility.    OBJECTIVE IMPAIRMENTS: decreased activity tolerance, decreased balance, decreased endurance, decreased mobility, difficulty walking, decreased strength, and pain.   ACTIVITY LIMITATIONS: carrying, lifting, bending, standing, squatting, stairs,  transfers, and dressing  PARTICIPATION LIMITATIONS: cleaning, shopping, community activity, and yard work  PERSONAL FACTORS: Age, Fitness, Past/current experiences, Time since onset of injury/illness/exacerbation, and 3+ comorbidities: Peripheral neuropathy, SOB, COPD  are also affecting patient's functional outcome.   REHAB POTENTIAL:  Good  CLINICAL DECISION MAKING: Evolving/moderate complexity  EVALUATION COMPLEXITY: Moderate   GOALS: Goals reviewed with patient? No  SHORT TERM GOALS: Target date: 12/01/2022   Pt will be independent with HEP in order to improve strength and balance in order to decrease fall risk and improve function at home. Baseline:  Goal status: INITIAL   LONG TERM GOALS: Target date: 12/29/2022  Pt will increase FOTO to at least 48 to demonstrate significant improvement in function at home related to balance  Baseline: 11/02/2022: 40 Goal status: INITIAL  2.  Pt will improve BERG by at least 3 points in order to demonstrate clinically significant improvement in balance.   Baseline: 11/02/2022: 39/56 Goal status: INITIAL  3.  Pt will improve ABC by at least 13% in order to demonstrate clinically significant improvement in balance confidence.      Baseline: 11/02/2022: 17.5% Goal status: INITIAL  4. Pt will decrease 5TSTS by at least 3 seconds in order to demonstrate clinically significant improvement in LE strength      Baseline: 11/02/2022: 16.65s Goal status: INITIAL  PLAN: PT FREQUENCY: 2x/week  PT DURATION: 8 weeks  PLANNED INTERVENTIONS: Therapeutic exercises, Therapeutic activity, Neuromuscular re-education, Balance training, Gait training, Patient/Family education, Self Care, Joint mobilization, Joint manipulation, Vestibular training, Canalith repositioning, Orthotic/Fit training, DME instructions, Dry Needling, Electrical stimulation, Spinal manipulation, Spinal mobilization, Cryotherapy, Moist heat, Taping, Traction, Ultrasound,  Ionotophoresis 4mg /ml Dexamethasone, Manual therapy, and Re-evaluation.  PLAN FOR NEXT SESSION: Hip and Knee ROM, Initiate strengthening and balance program  Chris Anay Rathe SPT Sharalyn Ink Huprich PT, DPT, GCS  Huprich,Jason 11/03/2022, 12:00 PM

## 2022-11-06 ENCOUNTER — Inpatient Hospital Stay: Payer: Medicare Other

## 2022-11-06 VITALS — BP 131/64 | HR 63

## 2022-11-06 DIAGNOSIS — D631 Anemia in chronic kidney disease: Secondary | ICD-10-CM | POA: Diagnosis not present

## 2022-11-06 DIAGNOSIS — N183 Chronic kidney disease, stage 3 unspecified: Secondary | ICD-10-CM | POA: Diagnosis not present

## 2022-11-06 DIAGNOSIS — Z23 Encounter for immunization: Secondary | ICD-10-CM | POA: Diagnosis not present

## 2022-11-06 DIAGNOSIS — D509 Iron deficiency anemia, unspecified: Secondary | ICD-10-CM | POA: Diagnosis not present

## 2022-11-06 DIAGNOSIS — D649 Anemia, unspecified: Secondary | ICD-10-CM

## 2022-11-06 MED ORDER — SODIUM CHLORIDE 0.9 % IV SOLN
Freq: Once | INTRAVENOUS | Status: AC
  Filled 2022-11-06: qty 250

## 2022-11-06 MED ORDER — SODIUM CHLORIDE 0.9 % IV SOLN
200.0000 mg | Freq: Once | INTRAVENOUS | Status: AC
  Administered 2022-11-06: 200 mg via INTRAVENOUS
  Filled 2022-11-06: qty 200

## 2022-11-09 ENCOUNTER — Ambulatory Visit: Payer: Medicare Other

## 2022-11-12 DIAGNOSIS — I2510 Atherosclerotic heart disease of native coronary artery without angina pectoris: Secondary | ICD-10-CM | POA: Diagnosis not present

## 2022-11-12 DIAGNOSIS — I480 Paroxysmal atrial fibrillation: Secondary | ICD-10-CM | POA: Diagnosis not present

## 2022-11-13 ENCOUNTER — Inpatient Hospital Stay: Payer: Medicare Other

## 2022-11-13 VITALS — BP 149/68 | HR 68 | Temp 97.7°F | Resp 20

## 2022-11-13 DIAGNOSIS — N183 Chronic kidney disease, stage 3 unspecified: Secondary | ICD-10-CM | POA: Diagnosis not present

## 2022-11-13 DIAGNOSIS — Z23 Encounter for immunization: Secondary | ICD-10-CM

## 2022-11-13 DIAGNOSIS — D649 Anemia, unspecified: Secondary | ICD-10-CM

## 2022-11-13 DIAGNOSIS — D631 Anemia in chronic kidney disease: Secondary | ICD-10-CM | POA: Diagnosis not present

## 2022-11-13 DIAGNOSIS — D509 Iron deficiency anemia, unspecified: Secondary | ICD-10-CM | POA: Diagnosis not present

## 2022-11-13 MED ORDER — INFLUENZA VAC A&B SURF ANT ADJ 0.5 ML IM SUSY
0.5000 mL | PREFILLED_SYRINGE | Freq: Once | INTRAMUSCULAR | Status: AC
  Administered 2022-11-13: 0.5 mL via INTRAMUSCULAR
  Filled 2022-11-13: qty 0.5

## 2022-11-13 MED ORDER — SODIUM CHLORIDE 0.9 % IV SOLN
200.0000 mg | Freq: Once | INTRAVENOUS | Status: AC
  Administered 2022-11-13: 200 mg via INTRAVENOUS
  Filled 2022-11-13: qty 200

## 2022-11-13 MED ORDER — SODIUM CHLORIDE 0.9 % IV SOLN
Freq: Once | INTRAVENOUS | Status: AC
  Filled 2022-11-13: qty 250

## 2022-11-13 NOTE — Patient Instructions (Signed)
Iron Sucrose Injection What is this medication? IRON SUCROSE (EYE ern SOO krose) treats low levels of iron (iron deficiency anemia) in people with kidney disease. Iron is a mineral that plays an important role in making red blood cells, which carry oxygen from your lungs to the rest of your body. This medicine may be used for other purposes; ask your health care provider or pharmacist if you have questions. COMMON BRAND NAME(S): Venofer What should I tell my care team before I take this medication? They need to know if you have any of these conditions: Anemia not caused by low iron levels Heart disease High levels of iron in the blood Kidney disease Liver disease An unusual or allergic reaction to iron, other medications, foods, dyes, or preservatives Pregnant or trying to get pregnant Breastfeeding How should I use this medication? This medication is for infusion into a vein. It is given in a hospital or clinic setting. Talk to your care team about the use of this medication in children. While this medication may be prescribed for children as young as 2 years for selected conditions, precautions do apply. Overdosage: If you think you have taken too much of this medicine contact a poison control center or emergency room at once. NOTE: This medicine is only for you. Do not share this medicine with others. What if I miss a dose? Keep appointments for follow-up doses. It is important not to miss your dose. Call your care team if you are unable to keep an appointment. What may interact with this medication? Do not take this medication with any of the following: Deferoxamine Dimercaprol Other iron products This medication may also interact with the following: Chloramphenicol Deferasirox This list may not describe all possible interactions. Give your health care provider a list of all the medicines, herbs, non-prescription drugs, or dietary supplements you use. Also tell them if you smoke,  drink alcohol, or use illegal drugs. Some items may interact with your medicine. What should I watch for while using this medication? Visit your care team regularly. Tell your care team if your symptoms do not start to get better or if they get worse. You may need blood work done while you are taking this medication. You may need to follow a special diet. Talk to your care team. Foods that contain iron include: whole grains/cereals, dried fruits, beans, or peas, leafy green vegetables, and organ meats (liver, kidney). What side effects may I notice from receiving this medication? Side effects that you should report to your care team as soon as possible: Allergic reactions--skin rash, itching, hives, swelling of the face, lips, tongue, or throat Low blood pressure--dizziness, feeling faint or lightheaded, blurry vision Shortness of breath Side effects that usually do not require medical attention (report to your care team if they continue or are bothersome): Flushing Headache Joint pain Muscle pain Nausea Pain, redness, or irritation at injection site This list may not describe all possible side effects. Call your doctor for medical advice about side effects. You may report side effects to FDA at 1-800-FDA-1088. Where should I keep my medication? This medication is given in a hospital or clinic. It will not be stored at home. NOTE: This sheet is a summary. It may not cover all possible information. If you have questions about this medicine, talk to your doctor, pharmacist, or health care provider.  2024 Elsevier/Gold Standard (2022-07-07 00:00:00)

## 2022-11-14 ENCOUNTER — Ambulatory Visit: Payer: Medicare Other | Attending: Neurology

## 2022-11-14 DIAGNOSIS — R262 Difficulty in walking, not elsewhere classified: Secondary | ICD-10-CM | POA: Insufficient documentation

## 2022-11-14 DIAGNOSIS — M6281 Muscle weakness (generalized): Secondary | ICD-10-CM | POA: Insufficient documentation

## 2022-11-14 DIAGNOSIS — R200 Anesthesia of skin: Secondary | ICD-10-CM | POA: Diagnosis not present

## 2022-11-14 NOTE — Therapy (Addendum)
OUTPATIENT PHYSICAL THERAPY BALANCE TREATMENT   Patient Name: Sean Reyes. MRN: [id number] DOB:1940/12/17, 82 y.o., male Today's Date: 11/14/2022  END OF SESSION:  PT End of Session - 11/14/22 1150     Visit Number 2    Number of Visits 17    Date for PT Re-Evaluation 12/28/22    Authorization Type eval: 11/02/2022    PT Start Time 1147    PT Stop Time 1230    PT Time Calculation (min) 43 min    Equipment Utilized During Treatment Gait belt    Activity Tolerance Patient tolerated treatment well    Behavior During Therapy WFL for tasks assessed/performed             Past Medical History:  Diagnosis Date   Anemia    Anxiety    Arthritis    Arthritis of neck    Atrial fibrillation (HCC)    Cataracts, bilateral    Complication of anesthesia    pt reports low BP's after surgery at [facility] and difficulty awakening   Depression    Diabetes (HCC)    dx 6-8 yrs ago   Dysrhythmia    a-fib   GERD (gastroesophageal reflux disease)    OCC TAKES ALKA SELTZER   History of kidney stones    10-15 yrs ago   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   Hyperlipidemia    Hypertension    Myocardial infarction (HCC) 12/2020   Nocturia    S/P ablation of atrial fibrillation    Ablative therapy   Sleep apnea    CPAP    Spinal stenosis    Tachycardia, unspecified    Past Surgical History:  Procedure Laterality Date   ABLATION     ANTERIOR LAT LUMBAR FUSION N/A 06/27/2017   Procedure: Anterior Lateral Lumbar Interbody  Fusion - Lumbar Two-Lumbar Three - Lumbar Three-Lumbar Four, Posterior Lumbar Interbody Fusion Lumbar Four- Five;  Surgeon: Donalee Citrin, MD;  Location: [facility] OR;  Service: Neurosurgery;  Laterality: N/A;  Anterior Lateral Lumbar Interbody  Fusion - Lumbar Two-Lumbar Three - Lumbar Three-Lumbar Four, Posterior Lumbar Interbody Fusion Lumbar Four- Five   BACK SURGERY     CARDIAC CATHETERIZATION     CARDIOVERSION N/A 08/29/2018   Procedure:  CARDIOVERSION;  Surgeon: Lamar Blinks, MD;  Location: [facility] ORS;  Service: Cardiovascular;  Laterality: N/A;   CARDIOVERSION N/A 09/24/2018   Procedure: CARDIOVERSION;  Surgeon: Lamar Blinks, MD;  Location: [facility] ORS;  Service: Cardiovascular;  Laterality: N/A;   COLONOSCOPY WITH PROPOFOL N/A 10/05/2015   Procedure: COLONOSCOPY WITH PROPOFOL;  Surgeon: Christena Deem, MD;  Location: [facility] ENDOSCOPY;  Service: Endoscopy;  Laterality: N/A;   COLONOSCOPY WITH PROPOFOL N/A 11/01/2020   Procedure: COLONOSCOPY WITH PROPOFOL;  Surgeon: Wyline Mood, MD;  Location: [facility] ENDOSCOPY;  Service: Gastroenterology;  Laterality: N/A;   CORONARY STENT INTERVENTION N/A 12/22/2020   Procedure: CORONARY STENT INTERVENTION;  Surgeon: Marcina Millard, MD;  Location: [facility] INVASIVE CV LAB;  Service: Cardiovascular;  Laterality: N/A;   ESOPHAGOGASTRODUODENOSCOPY N/A 11/01/2020   Procedure: ESOPHAGOGASTRODUODENOSCOPY (EGD);  Surgeon: Wyline Mood, MD;  Location: [facility] ENDOSCOPY;  Service: Gastroenterology;  Laterality: N/A;   ESOPHAGOGASTRODUODENOSCOPY (EGD) WITH PROPOFOL N/A 04/01/2018   Procedure: ESOPHAGOGASTRODUODENOSCOPY (EGD) WITH PROPOFOL;  Surgeon: Christena Deem, MD;  Location: [facility] ENDOSCOPY;  Service: Endoscopy;  Laterality: N/A;   ESOPHAGOGASTRODUODENOSCOPY (EGD) WITH PROPOFOL N/A 06/01/2022   Procedure: ESOPHAGOGASTRODUODENOSCOPY (EGD) WITH PROPOFOL;  Surgeon: Wyline Mood, MD;  Location: [facility]  ENDOSCOPY;  Service: Gastroenterology;  Laterality: N/A;   EYE SURGERY     HERNIA REPAIR     JOINT REPLACEMENT Bilateral    hips  RT+  LEFT X2    LEFT HEART CATH AND CORONARY ANGIOGRAPHY N/A 12/22/2020   Procedure: LEFT HEART CATH AND CORONARY ANGIOGRAPHY;  Surgeon: Marcina Millard, MD;  Location: [facility] INVASIVE CV LAB;  Service: Cardiovascular;  Laterality: N/A;   LEFT HEART CATH AND CORONARY ANGIOGRAPHY Left 08/23/2021   Procedure: LEFT HEART CATH AND CORONARY ANGIOGRAPHY;  Surgeon: Lamar Blinks, MD;  Location: [facility] INVASIVE CV LAB;  Service: Cardiovascular;  Laterality: Left;   LUMBAR LAMINECTOMY/DECOMPRESSION MICRODISCECTOMY Left 09/13/2016   Procedure: Microdiscectomy - Lumbar two-three,  Lumbar three- - left;  Surgeon: Donalee Citrin, MD;  Location: [facility] OR;  Service: Neurosurgery;  Laterality: Left;   SPINAL CORD STIMULATOR INSERTION  07/08/2019   TONSILLECTOMY     Patient Active Problem List   Diagnosis Date Noted   Finger numbness 10/13/2022   Subareolar mass of left breast 10/03/2022   COPD (chronic obstructive pulmonary disease) (HCC) 10/31/2021   Shortness of breath on exertion 08/04/2021   Diabetes mellitus with proteinuria (HCC) 04/04/2021   GERD without esophagitis 04/02/2021   Coronary artery disease 12/28/2020   History of non-ST elevation myocardial infarction (NSTEMI) 12/21/2020   Pain in right shin 10/25/2020   Peripheral vascular disease (HCC) 08/06/2020   Chronic pain syndrome 07/06/2020   Cervical facet joint syndrome 04/08/2020   Spinal cord stimulator status 12/11/2019   Elevated TSH 08/20/2019   CKD (chronic kidney disease) stage 3, GFR 30-59 ml/min (HCC) 01/19/2019   Acquired thrombophilia (HCC) 01/19/2019   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   Postlaminectomy syndrome, lumbar region 01/16/2019   History of fusion of lumbar spine (L2-L5) 01/16/2019   Chronic radicular lumbar pain 01/16/2019   HNP (herniated nucleus pulposus), lumbar 04/29/2018   Advanced care planning/counseling discussion 09/28/2016   Spinal stenosis, lumbar region, 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/13/2016   Hyperlipidemia associated with type 2 diabetes mellitus (HCC) 07/14/2015   Symptomatic anemia 06/30/2015   Benign prostatic hyperplasia without lower urinary tract symptoms 06/02/2015   OSA (obstructive sleep apnea) 03/23/2015   Hypertension associated with diabetes (HCC) 09/28/2014   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HCC) 09/28/2014   H/O prior ablation  treatment 10/19/2011   AF (paroxysmal atrial fibrillation) (HCC) 10/19/2011    PCP: Marjie Skiff, NP  REFERRING PROVIDER: Lonell Face, MD   REFERRING DIAG: R26.89 (ICD-10-CM) - Balance problem   RATIONALE FOR EVALUATION AND TREATMENT: Rehabilitation  THERAPY DIAG: Muscle weakness (generalized)  Difficulty in walking, not elsewhere classified  ONSET DATE: 2021  FOLLOW-UP APPT SCHEDULED WITH REFERRING PROVIDER: Yes    SUBJECTIVE:  SUBJECTIVE STATEMENT:  Leg pain, Difficulty with Walking, Imbalance   PERTINENT HISTORY:   Per Chart Review:   10/17/2022 "Patient reported he is not doing well lately. He has been seeing Dr. Myer Haff. He reports his legs are so weak that he can hardly walk, so he has been using a crutch for the last 1-2 years. Reported numbness and loss of sensation in his toes. He also reported that recently his left hand has began to has some numbness to where it feels like it is asleep. Denies pain, loss of sensation, coldness, burning. 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 "  Today:  Patient reports to physical therapy today with a chief concern of muscle weakness and imbalance. Patient reports progressive worsening in balance and muscular strength. He states that he has loss of sensation in his toes he reports they are "asleep". Patient reports pain in the anterior thigh and bilateral calves. He  currently uses an loftstand crutch in his RUE. Lately patient has reported difficulty with dressing LE, performing fine motor movements with left hand. He denies falls, saddle parasthesia, nausea, vomitting, night sweats.       Pain: Yes Numbness/Tingling: Yes Focal Weakness: Yes Recent changes in overall health/medication: No Prior history of physical therapy for balance:  Yes Dominant hand: right Imaging: Yes  Red flags: Negative for bowel/bladder changes, saddle paresthesia, personal history of cancer, h/o spinal tumors, h/o compression fx, h/o abdominal aneurysm, abdominal pain, chills/fever, night sweats, nausea, vomiting, unrelenting pain, first onset of insidious LBP <22 y/o  PRECAUTIONS: Fall  WEIGHT BEARING RESTRICTIONS: No  FALLS: Has patient fallen in last 6 months? 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  Recent memory is intact.  Remote memory is intact.  Attention span and concentration are intact.  Expressive speech is intact.  Patient's fund of knowledge is within normal limits for educational level.  Gross Musculoskeletal Assessment Tremor: None Bulk: Normal Tone: Normal  Posture: Rounded forward posture in seated. Standing posture: Forward and rounded in standing,  limited cervicothoracic extension.   AROM Deferred  LE MMT: MMT (out of 5) Right  Left   Hip flexion 4 4-  Hip extension    Hip abduction    Hip adduction    Hip internal rotation 5 4-  Hip external rotation 5 4*  Knee flexion    Knee extension 4+ 4+  Ankle dorsiflexion 4- 4-  Ankle plantarflexion 5 5  Ankle inversion    Ankle eversion    (* = pain; Blank rows =  not tested)  Sensation Grossly intact to light touch throughout bilateral LEs as determined by testing dermatomes L2-S2. Proprioception, stereognosis, and hot/cold testing deferred on this date.  Reflexes R/L Knee Jerk (L3/4): 2+/2+  Ankle Jerk (S1/2): 2+/2+   Cranial Nerves Deferred  Coordination/Cerebellar Finger to Nose: WNL Heel to Shin: Deferred Rapid alternating movements: WNL Finger Opposition: WNL Pronator Drift: Negative  Bed mobility: Deferred   Transfers: Assistive device utilized: None  Sit to stand: Complete Independence Stand to sit: Complete Independence Chair to chair: SBA Slowed, required few attempts at stand before transfer, definite use of hands  Floor: Deferred  Curb:  Deferred  Stairs: Deferred  Gait: Gait pattern: step through pattern Distance walked: 67m Assistive device utilized: Crutches Level of assistance: Complete Independence Comments: Slowed, Cautious gait with use of loftstrand crutch in RUE.   Functional Outcome Measures  Results Comments  BERG 39/56 2,4,4,3,3,4,4,3,3,2,2,2,2,1  DGI    FGA    TUG    5TSTS 16.65s  With UE support, unable to perform without BUE support.   6 Minute Walk Test    10 Meter Gait Speed Self-selected: 18s = .55 m/s;  Loftstrand RUE  (Blank rows = not tested)   TODAY'S TREATMENT   Subjective: Patient arrived with minor pain in upper neck and lower back. Reported no recent falls but continued pain in the right tibial crest region. No further questions or concerns.   Pain: Minor pain in the upper neck; didn't rate using NRPS    Neuromuscular Re-education: Sit to Stand 1 x 8 (without UE support);  Sit to Stand with Airex 1 x 8 (without UE support);  Forward Stepping Over 6" Hurdles in agility ladder x 2 trials Agility Ladder Forward Sideways and Retro stepping pattern x 2 trials 6" Step Taps with/without BUE Support 2 x 20 reps; 6" Step Taps on Airexa without BUE Support 1 x 20 reps; 12" Step without BUE Support 1 x 20 reps;  Side Stepping over 6" hurdles in // bars x multiple trials; Forward Stepping Over 12" hurdles in // bars x multiple trials; Resisted Gait in Forward, Sideways, Retro direction in agility ladder x 1 trial ea direction;   PATIENT EDUCATION:  Education details: HEP and Plan of Care Person educated: Patient  Education method: Explanation and Handouts Education comprehension: verbalized understanding   HOME EXERCISE PROGRAM:  Access Code: [email or web address] URL: [email or web address] Date: 11/02/2022 Prepared by: Ria Comment  Exercises - Seated Long Arc Quad  - 1 x daily - 7 x weekly - 1-3 sets - 10 reps - Heel Raises with Counter Support  - 1 x daily - 7 x weekly - 1-3 sets - 10 reps - Semi-Tandem Balance at [employer] Eyes Open  - 1 x daily - 7 x weekly - 2-3 sets - 10 reps - 10-30 hold   ASSESSMENT:  CLINICAL IMPRESSION: Patient arrived to physical therapy with no new reports. Today's session with a main focus on progressing patient's dynamic balance. Patient demonstrated ability to perform dynamic movements without loftstrand crutch. However patient required min guard throughout session in order to maintain balance. He continues to be highly motivated in order to improve dynamic balance. Plan for next session to include progressing gait training without crutch and challenge patient's balance. Pt will continue benefit from PT services to address deficits in strength, balance, and mobility in order to return to full function at home.   OBJECTIVE IMPAIRMENTS: decreased  activity tolerance, decreased balance, decreased endurance, decreased mobility, difficulty walking, decreased  strength, and pain.   ACTIVITY LIMITATIONS: carrying, lifting, bending, standing, squatting, stairs, transfers, and dressing  PARTICIPATION LIMITATIONS: cleaning, shopping, community activity, and yard work  PERSONAL FACTORS: Age, Fitness, Past/current experiences, Time since onset of injury/illness/exacerbation, and 3+ comorbidities: Peripheral neuropathy, SOB, COPD  are also affecting patient's functional outcome.   REHAB POTENTIAL: Good  CLINICAL DECISION MAKING: Evolving/moderate complexity  EVALUATION COMPLEXITY: Moderate   GOALS: Goals reviewed with patient? No  SHORT TERM GOALS: Target date: 12/12/2022   Pt will be independent with HEP in order to improve strength and balance in order to decrease fall risk and improve function at home. Baseline:  Goal status: INITIAL   LONG TERM GOALS: Target date: 01/09/2023  Pt will increase FOTO to at least 48 to demonstrate significant improvement in function at home related to balance  Baseline: 11/02/2022: 40 Goal status: INITIAL  2.  Pt will improve BERG by at least 3 points in order to demonstrate clinically significant improvement in balance.   Baseline: 11/02/2022: 39/56 Goal status: INITIAL  3.  Pt will improve ABC by at least 13% in order to demonstrate clinically significant improvement in balance confidence.      Baseline: 11/02/2022: 17.5% Goal status: INITIAL  4. Pt will decrease 5TSTS by at least 3 seconds in order to demonstrate clinically significant improvement in LE strength      Baseline: 11/02/2022: 16.65s Goal status: INITIAL  PLAN: PT FREQUENCY: 2x/week  PT DURATION: 8 weeks  PLANNED INTERVENTIONS: Therapeutic exercises, Therapeutic activity, Neuromuscular re-education, Balance training, Gait training, Patient/Family education, Self Care, Joint mobilization, Joint manipulation, Vestibular  training, Canalith repositioning, Orthotic/Fit training, DME instructions, Dry Needling, Electrical stimulation, Spinal manipulation, Spinal mobilization, Cryotherapy, Moist heat, Taping, Traction, Ultrasound, Ionotophoresis 4mg /ml Dexamethasone, Manual therapy, and Re-evaluation.  PLAN FOR NEXT SESSION: Hip and Knee ROM, Initiate strengthening and balance program  Thayer Ohm Syrai Gladwin SPT Maylon Peppers, PT, DPT Physical Therapist - [facility]  [facility]  11/14/2022, 1:19 PM

## 2022-11-15 DIAGNOSIS — R531 Weakness: Secondary | ICD-10-CM | POA: Diagnosis not present

## 2022-11-15 DIAGNOSIS — R200 Anesthesia of skin: Secondary | ICD-10-CM | POA: Diagnosis not present

## 2022-11-16 ENCOUNTER — Ambulatory Visit: Payer: Medicare Other

## 2022-11-16 DIAGNOSIS — R262 Difficulty in walking, not elsewhere classified: Secondary | ICD-10-CM

## 2022-11-16 DIAGNOSIS — M6281 Muscle weakness (generalized): Secondary | ICD-10-CM | POA: Diagnosis not present

## 2022-11-16 NOTE — Therapy (Addendum)
OUTPATIENT PHYSICAL THERAPY BALANCE TREATMENT   Patient Name: Dante Keetch. MRN: [id number] DOB:[birth year], 82 y.o., male Today's Date: 11/16/2022  END OF SESSION:  PT End of Session - 11/16/22 1144     Visit Number 3    Number of Visits 17    Date for PT Re-Evaluation 12/28/22    Authorization Type eval: 11/02/2022    PT Start Time 1145    PT Stop Time 1230    PT Time Calculation (min) 45 min    Equipment Utilized During Treatment Gait belt    Activity Tolerance Patient tolerated treatment well    Behavior During Therapy WFL for tasks assessed/performed             Past Medical History:  Diagnosis Date   Anemia    Anxiety    Arthritis    Arthritis of neck    Atrial fibrillation (HCC)    Cataracts, bilateral    Complication of anesthesia    pt reports low BP's after surgery at [facility] and difficulty awakening   Depression    Diabetes (HCC)    dx 6-8 yrs ago   Dysrhythmia    a-fib   GERD (gastroesophageal reflux disease)    OCC TAKES ALKA SELTZER   History of kidney stones    10-15 yrs ago   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   Hyperlipidemia    Hypertension    Myocardial infarction (HCC) 12/2020   Nocturia    S/P ablation of atrial fibrillation    Ablative therapy   Sleep apnea    CPAP    Spinal stenosis    Tachycardia, unspecified    Past Surgical History:  Procedure Laterality Date   ABLATION     ANTERIOR LAT LUMBAR FUSION N/A 06/27/2017   Procedure: Anterior Lateral Lumbar Interbody  Fusion - Lumbar Two-Lumbar Three - Lumbar Three-Lumbar Four, Posterior Lumbar Interbody Fusion Lumbar Four- Five;  Surgeon: Donalee Citrin, MD;  Location: [facility] OR;  Service: Neurosurgery;  Laterality: N/A;  Anterior Lateral Lumbar Interbody  Fusion - Lumbar Two-Lumbar Three - Lumbar Three-Lumbar Four, Posterior Lumbar Interbody Fusion Lumbar Four- Five   BACK SURGERY     CARDIAC CATHETERIZATION     CARDIOVERSION N/A 08/29/2018   Procedure:  CARDIOVERSION;  Surgeon: Lamar Blinks, MD;  Location: [facility] ORS;  Service: Cardiovascular;  Laterality: N/A;   CARDIOVERSION N/A 09/24/2018   Procedure: CARDIOVERSION;  Surgeon: Lamar Blinks, MD;  Location: [facility] ORS;  Service: Cardiovascular;  Laterality: N/A;   COLONOSCOPY WITH PROPOFOL N/A 10/05/2015   Procedure: COLONOSCOPY WITH PROPOFOL;  Surgeon: Christena Deem, MD;  Location: [facility] ENDOSCOPY;  Service: Endoscopy;  Laterality: N/A;   COLONOSCOPY WITH PROPOFOL N/A 11/01/2020   Procedure: COLONOSCOPY WITH PROPOFOL;  Surgeon: Wyline Mood, MD;  Location: [facility] ENDOSCOPY;  Service: Gastroenterology;  Laterality: N/A;   CORONARY STENT INTERVENTION N/A 12/22/2020   Procedure: CORONARY STENT INTERVENTION;  Surgeon: Marcina Millard, MD;  Location: [facility] INVASIVE CV LAB;  Service: Cardiovascular;  Laterality: N/A;   ESOPHAGOGASTRODUODENOSCOPY N/A 11/01/2020   Procedure: ESOPHAGOGASTRODUODENOSCOPY (EGD);  Surgeon: Wyline Mood, MD;  Location: [facility] ENDOSCOPY;  Service: Gastroenterology;  Laterality: N/A;   ESOPHAGOGASTRODUODENOSCOPY (EGD) WITH PROPOFOL N/A 04/01/2018   Procedure: ESOPHAGOGASTRODUODENOSCOPY (EGD) WITH PROPOFOL;  Surgeon: Christena Deem, MD;  Location: [facility] ENDOSCOPY;  Service: Endoscopy;  Laterality: N/A;   ESOPHAGOGASTRODUODENOSCOPY (EGD) WITH PROPOFOL N/A 06/01/2022   Procedure: ESOPHAGOGASTRODUODENOSCOPY (EGD) WITH PROPOFOL;  Surgeon: Wyline Mood, MD;  Location: [facility]  ENDOSCOPY;  Service: Gastroenterology;  Laterality: N/A;   EYE SURGERY     HERNIA REPAIR     JOINT REPLACEMENT Bilateral    hips  RT+  LEFT X2    LEFT HEART CATH AND CORONARY ANGIOGRAPHY N/A 12/22/2020   Procedure: LEFT HEART CATH AND CORONARY ANGIOGRAPHY;  Surgeon: Marcina Millard, MD;  Location: [facility] INVASIVE CV LAB;  Service: Cardiovascular;  Laterality: N/A;   LEFT HEART CATH AND CORONARY ANGIOGRAPHY Left 08/23/2021   Procedure: LEFT HEART CATH AND CORONARY ANGIOGRAPHY;  Surgeon: Lamar Blinks, MD;  Location: [facility] INVASIVE CV LAB;  Service: Cardiovascular;  Laterality: Left;   LUMBAR LAMINECTOMY/DECOMPRESSION MICRODISCECTOMY Left 09/13/2016   Procedure: Microdiscectomy - Lumbar two-three,  Lumbar three- - left;  Surgeon: Donalee Citrin, MD;  Location: [facility] OR;  Service: Neurosurgery;  Laterality: Left;   SPINAL CORD STIMULATOR INSERTION  07/08/2019   TONSILLECTOMY     Patient Active Problem List   Diagnosis Date Noted   Finger numbness 10/13/2022   Subareolar mass of left breast 10/03/2022   COPD (chronic obstructive pulmonary disease) (HCC) 10/31/2021   Shortness of breath on exertion 08/04/2021   Diabetes mellitus with proteinuria (HCC) 04/04/2021   GERD without esophagitis 04/02/2021   Coronary artery disease 12/28/2020   History of non-ST elevation myocardial infarction (NSTEMI) 12/21/2020   Pain in right shin 10/25/2020   Peripheral vascular disease (HCC) 08/06/2020   Chronic pain syndrome 07/06/2020   Cervical facet joint syndrome 04/08/2020   Spinal cord stimulator status 12/11/2019   Elevated TSH 08/20/2019   CKD (chronic kidney disease) stage 3, GFR 30-59 ml/min (HCC) 01/19/2019   Acquired thrombophilia (HCC) 01/19/2019   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   Postlaminectomy syndrome, lumbar region 01/16/2019   History of fusion of lumbar spine (L2-L5) 01/16/2019   Chronic radicular lumbar pain 01/16/2019   HNP (herniated nucleus pulposus), lumbar 04/29/2018   Advanced care planning/counseling discussion 09/28/2016   Spinal stenosis, lumbar region, 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/13/2016   Hyperlipidemia associated with type 2 diabetes mellitus (HCC) 07/14/2015   Symptomatic anemia 06/30/2015   Benign prostatic hyperplasia without lower urinary tract symptoms 06/02/2015   OSA (obstructive sleep apnea) 03/23/2015   Hypertension associated with diabetes (HCC) 09/28/2014   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HCC) 09/28/2014   H/O prior ablation  treatment 10/19/2011   AF (paroxysmal atrial fibrillation) (HCC) 10/19/2011    PCP: Marjie Skiff, NP  REFERRING PROVIDER: Lonell Face, MD   REFERRING DIAG: R26.89 (ICD-10-CM) - Balance problem   RATIONALE FOR EVALUATION AND TREATMENT: Rehabilitation  THERAPY DIAG: No diagnosis found.  ONSET DATE: 2021  FOLLOW-UP APPT SCHEDULED WITH REFERRING PROVIDER: Yes    SUBJECTIVE:  SUBJECTIVE STATEMENT:  Leg pain, Difficulty with Walking, Imbalance   PERTINENT HISTORY:   Per Chart Review:   10/17/2022 "Patient reported he is not doing well lately. He has been seeing Dr. Myer Haff. He reports his legs are so weak that he can hardly walk, so he has been using a crutch for the last 1-2 years. Reported numbness and loss of sensation in his toes. He also reported that recently his left hand has began to has some numbness to where it feels like it is asleep. Denies pain, loss of sensation, coldness, burning. 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 "  Today:  Patient reports to physical therapy today with a chief concern of muscle weakness and imbalance. Patient reports progressive worsening in balance and muscular strength. He states that he has loss of sensation in his toes he reports they are "asleep". Patient reports pain in the anterior thigh and bilateral calves. He currently uses an loftstand crutch in his RUE. Lately  patient has reported difficulty with dressing LE, performing fine motor movements with left hand. He denies falls, saddle parasthesia, nausea, vomitting, night sweats.       Pain: Yes Numbness/Tingling: Yes Focal Weakness: Yes Recent changes in overall health/medication: No Prior history of physical therapy for balance:  Yes Dominant hand: right Imaging: Yes  Red flags: Negative for bowel/bladder changes, saddle paresthesia, personal history of cancer, h/o spinal tumors, h/o compression fx, h/o abdominal aneurysm, abdominal pain, chills/fever, night sweats, nausea, vomiting, unrelenting pain, first onset of insidious LBP <22 y/o  PRECAUTIONS: Fall  WEIGHT BEARING RESTRICTIONS: No  FALLS: Has patient fallen in last 6 months? 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  Recent memory is intact.  Remote memory is intact.  Attention span and concentration are intact.  Expressive speech is intact.  Patient's fund of knowledge is within normal limits for educational level.  Gross Musculoskeletal Assessment Tremor: None Bulk: Normal Tone: Normal  Posture: Rounded forward posture in seated. Standing posture: Forward and rounded in standing, limited cervicothoracic extension.    AROM Deferred  LE MMT: MMT (out of 5) Right  Left   Hip flexion 4 4-  Hip extension    Hip abduction    Hip adduction    Hip internal rotation 5 4-  Hip external rotation 5 4*  Knee flexion    Knee extension 4+ 4+  Ankle dorsiflexion 4- 4-  Ankle plantarflexion 5 5  Ankle inversion    Ankle eversion    (* = pain; Blank rows =  not tested)  Sensation Grossly intact to light touch throughout bilateral LEs as determined by testing dermatomes L2-S2. Proprioception, stereognosis, and hot/cold testing deferred on this date.  Reflexes R/L Knee Jerk (L3/4): 2+/2+  Ankle Jerk (S1/2): 2+/2+   Cranial Nerves Deferred  Coordination/Cerebellar Finger to Nose: WNL Heel to Shin: Deferred Rapid alternating movements: WNL Finger Opposition: WNL Pronator Drift: Negative  Bed mobility: Deferred   Transfers: Assistive device utilized: None  Sit to stand: Complete Independence Stand to sit: Complete Independence Chair to chair: SBA Slowed, required few attempts at stand before transfer, definite use of hands  Floor: Deferred  Curb:  Deferred  Stairs: Deferred  Gait: Gait pattern: step through pattern Distance walked: 11m Assistive device utilized: Crutches Level of assistance: Complete Independence Comments: Slowed, Cautious gait with use of loftstrand crutch in RUE.   Functional Outcome Measures  Results Comments  BERG 39/56 2,4,4,3,3,4,4,3,3,2,2,2,2,1  DGI    FGA    TUG    5TSTS 16.65s  With UE support, unable to perform without BUE support.   6 Minute Walk Test    10 Meter Gait Speed Self-selected: 18s = .55 m/s;  Loftstrand RUE  (Blank rows = not tested)   TODAY'S TREATMENT   Subjective: Patient followed up with neurologist yesterday. Patient states a little more fatigued today.   Pain: 7/10 Lumbar Back Pain  Neuromuscular Re-education: NuStep Level 0-1 x 8 min for warm up and history interval, PT manually adjusted resistance;  Airex Pad, Parallel  Stance, Horizontal Head Turns 30s/bout x 2 bouts; Airex Pad, Parallel Stance, with horizontal scanning and reaching  30s/bout x 1 bouts; Standing on Airex with Ball Toss against wall (forward, sideways L/R) 1 x 10 ea direction;  Marching on Airex with 2# AW on Ankles, No UE support, 2 x 10 steps; Seated LAQ with 2# AW 2 x 15 ea leg;   Side Stepping on Airex Beam in // bars x 2 trials; Tandem Stepping on line // Bars x 2 trials; Tandem Stepping on airex beam in // bars x 2 trials     Not Performed: Sit to Stand 1 x 8 (without UE support);  Sit to Stand with Airex 1 x 8 (without UE support);  Forward Stepping Over 6" Hurdles in agility ladder x 2 trials Agility Ladder Forward Sideways and Retro stepping pattern x 2 trials 6" Step Taps with/without BUE Support 2 x 20 reps; 6" Step Taps on Airexa without BUE Support 1 x 20 reps; 12" Step without BUE Support 1 x 20 reps;  Side Stepping over 6" hurdles in // bars x multiple trials; Forward Stepping Over 12" hurdles in // bars x multiple trials; Resisted Gait in Forward, Sideways, Retro direction in agility ladder x 1 trial ea direction;   PATIENT EDUCATION:  Education details: HEP and Plan of Care Person educated: Patient  Education method: Explanation and Handouts Education comprehension: verbalized understanding   HOME EXERCISE PROGRAM:  Access Code: [email or web address] URL: [email or web address] Date: 11/02/2022 Prepared by: Ria Comment  Exercises - Seated Long Arc Quad  - 1 x daily - 7 x weekly - 1-3 sets - 10 reps - Heel Raises with Counter Support  - 1 x daily - 7 x weekly - 1-3 sets - 10 reps - Semi-Tandem Balance at [employer] Eyes Open  - 1 x daily - 7 x weekly - 2-3 sets - 10 reps - 10-30 hold   ASSESSMENT:  CLINICAL IMPRESSION: Today's session with a main focus on improving dynamic balance.  Pt demonstrated ability to maintain balance against external pertubations. Required min guard for tasks requiring narrow  base of support. Patient continues to be limited with prolonged walking due to lumbar pain, impaired sensation and LE weakness. Lumbar pain improved following interventions; PT encouraged pt to continue HEP and stretches. Pt will continue benefit from PT services to address deficits in strength, balance, and mobility in order to return to full function at home.   OBJECTIVE IMPAIRMENTS: decreased activity tolerance, decreased balance, decreased endurance, decreased mobility, difficulty walking, decreased strength, and pain.   ACTIVITY LIMITATIONS: carrying, lifting, bending, standing, squatting, stairs, transfers, and dressing  PARTICIPATION LIMITATIONS: cleaning, shopping, community activity, and yard work  PERSONAL FACTORS: Age, Fitness, Past/current experiences, Time since onset of injury/illness/exacerbation, and 3+ comorbidities: Peripheral neuropathy, SOB, COPD  are also affecting patient's functional outcome.   REHAB POTENTIAL: Good  CLINICAL DECISION MAKING: Evolving/moderate complexity  EVALUATION COMPLEXITY: Moderate   GOALS: Goals reviewed with patient? No  SHORT TERM GOALS: Target date: 12/14/2022   Pt will be independent with HEP in order to improve strength and balance in order to decrease fall risk and improve function at home. Baseline:  Goal status: INITIAL   LONG TERM GOALS: Target date: 01/11/2023  Pt will increase FOTO to at least 48 to demonstrate significant improvement in function at home related to balance  Baseline: 11/02/2022: 40 Goal status: INITIAL  2.  Pt will improve BERG by at least 3 points in order to demonstrate clinically significant improvement in balance.   Baseline: 11/02/2022: 39/56 Goal status: INITIAL  3.  Pt will improve ABC by at least 13% in order to demonstrate clinically significant improvement in balance confidence.      Baseline: 11/02/2022: 17.5% Goal status: INITIAL  4. Pt will decrease 5TSTS by at least 3 seconds in order to  demonstrate clinically significant improvement in LE strength      Baseline: 11/02/2022: 16.65s Goal status: INITIAL  PLAN: PT FREQUENCY: 2x/week  PT DURATION: 8 weeks  PLANNED INTERVENTIONS: Therapeutic exercises, Therapeutic activity, Neuromuscular re-education, Balance training, Gait training, Patient/Family education, Self Care, Joint mobilization, Joint manipulation, Vestibular training, Canalith repositioning, Orthotic/Fit training, DME instructions, Dry Needling, Electrical stimulation, Spinal manipulation, Spinal mobilization, Cryotherapy, Moist heat, Taping, Traction, Ultrasound, Ionotophoresis 4mg /ml Dexamethasone, Manual therapy, and Re-evaluation.  PLAN FOR NEXT SESSION: Hip and Knee ROM, Initiate strengthening and balance program  Thayer Ohm Maicie Vanderloop SPT Maylon Peppers, PT, DPT Physical Therapist - [facility]  [facility]  11/16/2022, 12:49 PM

## 2022-11-20 ENCOUNTER — Other Ambulatory Visit: Payer: Self-pay | Admitting: Gastroenterology

## 2022-11-21 ENCOUNTER — Ambulatory Visit: Payer: Medicare Other

## 2022-11-21 DIAGNOSIS — M6281 Muscle weakness (generalized): Secondary | ICD-10-CM | POA: Diagnosis not present

## 2022-11-21 DIAGNOSIS — R262 Difficulty in walking, not elsewhere classified: Secondary | ICD-10-CM | POA: Diagnosis not present

## 2022-11-21 NOTE — Therapy (Cosign Needed Addendum)
OUTPATIENT PHYSICAL THERAPY BALANCE TREATMENT   Patient Name: Sean Reyes. MRN: [id number] DOB:05/01/1940, 82 y.o., male Today's Date: 11/22/2022  END OF SESSION:  PT End of Session - 11/21/22 0400     Visit Number 4    Number of Visits 17    Date for PT Re-Evaluation 12/28/22    Authorization Type eval: 11/02/2022    PT Start Time 1110    PT Stop Time 1150    PT Time Calculation (min) 40 min    Equipment Utilized During Treatment Gait belt    Activity Tolerance Patient tolerated treatment well    Behavior During Therapy WFL for tasks assessed/performed            Past Medical History:  Diagnosis Date   Anemia    Anxiety    Arthritis    Arthritis of neck    Atrial fibrillation (HCC)    Cataracts, bilateral    Complication of anesthesia    pt reports low BP's after surgery at [facility] and difficulty awakening   Depression    Diabetes (HCC)    dx 6-8 yrs ago   Dysrhythmia    a-fib   GERD (gastroesophageal reflux disease)    OCC TAKES ALKA SELTZER   History of kidney stones    10-15 yrs ago   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   Hyperlipidemia    Hypertension    Myocardial infarction (HCC) 12/2020   Nocturia    S/P ablation of atrial fibrillation    Ablative therapy   Sleep apnea    CPAP    Spinal stenosis    Tachycardia, unspecified    Past Surgical History:  Procedure Laterality Date   ABLATION     ANTERIOR LAT LUMBAR FUSION N/A 06/27/2017   Procedure: Anterior Lateral Lumbar Interbody  Fusion - Lumbar Two-Lumbar Three - Lumbar Three-Lumbar Four, Posterior Lumbar Interbody Fusion Lumbar Four- Five;  Surgeon: Donalee Citrin, MD;  Location: [facility] OR;  Service: Neurosurgery;  Laterality: N/A;  Anterior Lateral Lumbar Interbody  Fusion - Lumbar Two-Lumbar Three - Lumbar Three-Lumbar Four, Posterior Lumbar Interbody Fusion Lumbar Four- Five   BACK SURGERY     CARDIAC CATHETERIZATION     CARDIOVERSION N/A 08/29/2018   Procedure:  CARDIOVERSION;  Surgeon: Lamar Blinks, MD;  Location: [facility] ORS;  Service: Cardiovascular;  Laterality: N/A;   CARDIOVERSION N/A 09/24/2018   Procedure: CARDIOVERSION;  Surgeon: Lamar Blinks, MD;  Location: [facility] ORS;  Service: Cardiovascular;  Laterality: N/A;   COLONOSCOPY WITH PROPOFOL N/A 10/05/2015   Procedure: COLONOSCOPY WITH PROPOFOL;  Surgeon: Christena Deem, MD;  Location: [facility] ENDOSCOPY;  Service: Endoscopy;  Laterality: N/A;   COLONOSCOPY WITH PROPOFOL N/A 11/01/2020   Procedure: COLONOSCOPY WITH PROPOFOL;  Surgeon: Wyline Mood, MD;  Location: [facility] ENDOSCOPY;  Service: Gastroenterology;  Laterality: N/A;   CORONARY STENT INTERVENTION N/A 12/22/2020   Procedure: CORONARY STENT INTERVENTION;  Surgeon: Marcina Millard, MD;  Location: [facility] INVASIVE CV LAB;  Service: Cardiovascular;  Laterality: N/A;   ESOPHAGOGASTRODUODENOSCOPY N/A 11/01/2020   Procedure: ESOPHAGOGASTRODUODENOSCOPY (EGD);  Surgeon: Wyline Mood, MD;  Location: [facility] ENDOSCOPY;  Service: Gastroenterology;  Laterality: N/A;   ESOPHAGOGASTRODUODENOSCOPY (EGD) WITH PROPOFOL N/A 04/01/2018   Procedure: ESOPHAGOGASTRODUODENOSCOPY (EGD) WITH PROPOFOL;  Surgeon: Christena Deem, MD;  Location: [facility] ENDOSCOPY;  Service: Endoscopy;  Laterality: N/A;   ESOPHAGOGASTRODUODENOSCOPY (EGD) WITH PROPOFOL N/A 06/01/2022   Procedure: ESOPHAGOGASTRODUODENOSCOPY (EGD) WITH PROPOFOL;  Surgeon: Wyline Mood, MD;  Location: [facility] ENDOSCOPY;  Service: Gastroenterology;  Laterality: N/A;   EYE SURGERY     HERNIA REPAIR     JOINT REPLACEMENT Bilateral    hips  RT+  LEFT X2    LEFT HEART CATH AND CORONARY ANGIOGRAPHY N/A 12/22/2020   Procedure: LEFT HEART CATH AND CORONARY ANGIOGRAPHY;  Surgeon: Marcina Millard, MD;  Location: [facility] INVASIVE CV LAB;  Service: Cardiovascular;  Laterality: N/A;   LEFT HEART CATH AND CORONARY ANGIOGRAPHY Left 08/23/2021   Procedure: LEFT HEART CATH AND CORONARY ANGIOGRAPHY;  Surgeon: Lamar Blinks, MD;  Location: [facility] INVASIVE CV LAB;  Service: Cardiovascular;  Laterality: Left;   LUMBAR LAMINECTOMY/DECOMPRESSION MICRODISCECTOMY Left 09/13/2016   Procedure: Microdiscectomy - Lumbar two-three,  Lumbar three- - left;  Surgeon: Donalee Citrin, MD;  Location: [facility] OR;  Service: Neurosurgery;  Laterality: Left;   SPINAL CORD STIMULATOR INSERTION  07/08/2019   TONSILLECTOMY     Patient Active Problem List   Diagnosis Date Noted   Finger numbness 10/13/2022   Subareolar mass of left breast 10/03/2022   COPD (chronic obstructive pulmonary disease) (HCC) 10/31/2021   Shortness of breath on exertion 08/04/2021   Diabetes mellitus with proteinuria (HCC) 04/04/2021   GERD without esophagitis 04/02/2021   Coronary artery disease 12/28/2020   History of non-ST elevation myocardial infarction (NSTEMI) 12/21/2020   Pain in right shin 10/25/2020   Peripheral vascular disease (HCC) 08/06/2020   Chronic pain syndrome 07/06/2020   Cervical facet joint syndrome 04/08/2020   Spinal cord stimulator status 12/11/2019   Elevated TSH 08/20/2019   CKD (chronic kidney disease) stage 3, GFR 30-59 ml/min (HCC) 01/19/2019   Acquired thrombophilia (HCC) 01/19/2019   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   Postlaminectomy syndrome, lumbar region 01/16/2019   History of fusion of lumbar spine (L2-L5) 01/16/2019   Chronic radicular lumbar pain 01/16/2019   HNP (herniated nucleus pulposus), lumbar 04/29/2018   Advanced care planning/counseling discussion 09/28/2016   Spinal stenosis, lumbar region, 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/13/2016   Hyperlipidemia associated with type 2 diabetes mellitus (HCC) 07/14/2015   Symptomatic anemia 06/30/2015   Benign prostatic hyperplasia without lower urinary tract symptoms 06/02/2015   OSA (obstructive sleep apnea) 03/23/2015   Hypertension associated with diabetes (HCC) 09/28/2014   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HCC) 09/28/2014   H/O prior ablation  treatment 10/19/2011   AF (paroxysmal atrial fibrillation) (HCC) 10/19/2011   PCP: Marjie Skiff, NP  REFERRING PROVIDER: Lonell Face, MD   REFERRING DIAG: R26.89 (ICD-10-CM) - Balance problem   RATIONALE FOR EVALUATION AND TREATMENT: Rehabilitation  THERAPY DIAG: Muscle weakness (generalized)  ONSET DATE: 2021  FOLLOW-UP APPT SCHEDULED WITH REFERRING PROVIDER: Yes    SUBJECTIVE:  SUBJECTIVE STATEMENT:  Leg pain, Difficulty with Walking, Imbalance   PERTINENT HISTORY:   Per Chart Review:   10/17/2022 "Patient reported he is not doing well lately. He has been seeing Dr. Myer Haff. He reports his legs are so weak that he can hardly walk, so he has been using a crutch for the last 1-2 years. Reported numbness and loss of sensation in his toes. He also reported that recently his left hand has began to has some numbness to where it feels like it is asleep. Denies pain, loss of sensation, coldness, burning. 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 "  Today:  Patient reports to physical therapy today with a chief concern of muscle weakness and imbalance. Patient reports progressive worsening in balance and muscular strength. He states that he has loss of sensation in his toes he reports they are "asleep". Patient reports pain in the anterior thigh and bilateral calves. He currently uses an loftstand crutch in his RUE.  Lately patient has reported difficulty with dressing LE, performing fine motor movements with left hand. He denies falls, saddle parasthesia, nausea, vomitting, night sweats.       Pain: Yes Numbness/Tingling: Yes Focal Weakness: Yes Recent changes in overall health/medication: No Prior history of physical therapy for balance:  Yes Dominant hand: right Imaging: Yes  Red flags: Negative for bowel/bladder changes, saddle paresthesia, personal history of cancer, h/o spinal tumors, h/o compression fx, h/o abdominal aneurysm, abdominal pain, chills/fever, night sweats, nausea, vomiting, unrelenting pain, first onset of insidious LBP <22 y/o  PRECAUTIONS: Fall  WEIGHT BEARING RESTRICTIONS: No  FALLS: Has patient fallen in last 6 months? 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  Recent memory is intact.  Remote memory is intact.  Attention span and concentration are intact.  Expressive speech is intact.  Patient's fund of knowledge is within normal limits for educational level.  Gross Musculoskeletal Assessment Tremor: None Bulk: Normal Tone: Normal  Posture: Rounded forward posture in seated. Standing posture: Forward and rounded in standing, limited cervicothoracic extension.    AROM Deferred  LE MMT: MMT (out of 5) Right  Left   Hip flexion 4 4-  Hip extension    Hip abduction    Hip adduction    Hip internal rotation 5 4-  Hip external rotation 5 4*  Knee flexion    Knee extension 4+ 4+  Ankle dorsiflexion 4- 4-  Ankle plantarflexion 5 5  Ankle inversion    Ankle eversion    (* = pain; Blank rows =  not tested)  Sensation Grossly intact to light touch throughout bilateral LEs as determined by testing dermatomes L2-S2. Proprioception, stereognosis, and hot/cold testing deferred on this date.  Reflexes R/L Knee Jerk (L3/4): 2+/2+  Ankle Jerk (S1/2): 2+/2+   Cranial Nerves Deferred  Coordination/Cerebellar Finger to Nose: WNL Heel to Shin: Deferred Rapid alternating movements: WNL Finger Opposition: WNL Pronator Drift: Negative  Bed mobility: Deferred   Transfers: Assistive device utilized: None  Sit to stand: Complete Independence Stand to sit: Complete Independence Chair to chair: SBA Slowed, required few attempts at stand before transfer, definite use of hands  Floor: Deferred  Curb:  Deferred  Stairs: Deferred  Gait: Gait pattern: step through pattern Distance walked: 74m Assistive device utilized: Crutches Level of assistance: Complete Independence Comments: Slowed, Cautious gait with use of loftstrand crutch in RUE.   Functional Outcome Measures  Results Comments  BERG 39/56 2,4,4,3,3,4,4,3,3,2,2,2,2,1  DGI    FGA    TUG    5TSTS 16.65s  With UE support, unable to perform without BUE support.   6 Minute Walk Test    10 Meter Gait Speed Self-selected: 18s = .55 m/s;  Loftstrand RUE  (Blank rows = not tested)   TODAY'S TREATMENT   Subjective: Patient arrives to physical therapy without new reports. No further questions or concerns.   Pain: 6/10 Lower Back   Neuromuscular Re-education: Seated marches with 4# AW on Ankles, 2 x 15 ea leg;  Seated LAQ with 4# AW 2 x 15 ea leg;   Forward Stepping Over 6"  Hurdles in // bars ladder x 2 trials Forward Stepping Over 12" hurdles in // bars x multiple trials; Side Stepping over 6" hurdles in // bars x 2 trials on Airex Beam in // bars x 2 trials; Side Stepping on airex beam 6" hurdles in // bars x multiple trials; Bending and reaching for x 4 cone then Tandem Walking in // bars x 4 trials; Multi-Directional Tasks at Star (6" step at 12 oclock, Bilateral Cone Tap at 9 oclock, Airex Pad at 3 oclock, x2 sit to stand 6 oclock);  Sit to Stand with Airex 1 x 8 (without UE support);   Not Performed: Sit to Stand 1 x 8 (without UE support);  Sit to Stand with Airex 1 x 8 (without UE support);  Agility Ladder Forward Sideways and Retro stepping pattern x 2 trials 6" Step Taps with/without BUE Support 2 x 20 reps; 6" Step Taps on Airexa without BUE Support 1 x 20 reps; 12" Step without BUE Support 1 x 20 reps;  Forward Stepping Over 12" hurdles in // bars x multiple trials; Resisted Gait in Forward, Sideways, Retro direction in agility ladder x 1 trial ea direction;   PATIENT EDUCATION:  Education details: HEP and Plan of Care Person educated: Patient  Education method: Explanation and Handouts Education comprehension: verbalized understanding   HOME EXERCISE PROGRAM:  Access Code: [email or web address] URL: [email or web address] Date: 11/02/2022 Prepared by: Ria Comment  Exercises - Seated Long Arc Quad  - 1 x daily - 7 x weekly - 1-3 sets - 10 reps - Heel Raises with Counter Support  - 1 x daily - 7 x weekly - 1-3 sets - 10 reps - Semi-Tandem Balance at [employer] Eyes Open  - 1 x daily - 7 x weekly - 2-3 sets - 10 reps - 10-30 hold   ASSESSMENT:  CLINICAL IMPRESSION: Today's session with a main focus on improving dynamic balance. Although patient reported lumbar pain at  beginning of session he tolerated increase in amount balance exercises in today's session. He performed all dynamic balance tasks without use of AD and no major bouts  of LOB. Patient able to demonstrate improvements with single leg stance; held balance bilateral 3-5 sec without LOB. Pt endorsed minor improvement in lumbar pain today's interventions; PT encouraged pt to continue HEP and stretches. Pt will continue benefit from PT services to address deficits in strength, balance, and mobility in order to return to full function at home.  OBJECTIVE IMPAIRMENTS: decreased activity tolerance, decreased balance, decreased endurance, decreased mobility, difficulty walking, decreased strength, and pain.   ACTIVITY LIMITATIONS: carrying, lifting, bending, standing, squatting, stairs, transfers, and dressing  PARTICIPATION LIMITATIONS: cleaning, shopping, community activity, and yard work  PERSONAL FACTORS: Age, Fitness, Past/current experiences, Time since onset of injury/illness/exacerbation, and 3+ comorbidities: Peripheral neuropathy, SOB, COPD  are also affecting patient's functional outcome.   REHAB POTENTIAL: Good  CLINICAL DECISION MAKING: Evolving/moderate complexity  EVALUATION COMPLEXITY: Moderate   GOALS: Goals reviewed with patient? No  SHORT TERM GOALS: Target date: 12/20/2022   Pt will be independent with HEP in order to improve strength and balance in order to decrease fall risk and improve function at home. Baseline:  Goal status: INITIAL   LONG TERM GOALS: Target date: 01/17/2023  Pt will increase FOTO to at least 48 to demonstrate significant improvement in function at home related to balance  Baseline: 11/02/2022: 40 Goal status: INITIAL  2.  Pt will improve BERG by at least 3 points in order to demonstrate clinically significant improvement in balance.   Baseline: 11/02/2022: 39/56 Goal status: INITIAL  3.  Pt will improve ABC by at least 13% in order to demonstrate clinically significant improvement in balance confidence.      Baseline: 11/02/2022: 17.5% Goal status: INITIAL  4. Pt will decrease 5TSTS by at least 3 seconds in  order to demonstrate clinically significant improvement in LE strength      Baseline: 11/02/2022: 16.65s Goal status: INITIAL  PLAN: PT FREQUENCY: 2x/week  PT DURATION: 8 weeks  PLANNED INTERVENTIONS: Therapeutic exercises, Therapeutic activity, Neuromuscular re-education, Balance training, Gait training, Patient/Family education, Self Care, Joint mobilization, Joint manipulation, Vestibular training, Canalith repositioning, Orthotic/Fit training, DME instructions, Dry Needling, Electrical stimulation, Spinal manipulation, Spinal mobilization, Cryotherapy, Moist heat, Taping, Traction, Ultrasound, Ionotophoresis 4mg /ml Dexamethasone, Manual therapy, and Re-evaluation.  PLAN FOR NEXT SESSION: Hip and Knee ROM, Initiate strengthening and balance program  Lynnea Maizes PT, DPT, GCS  Delayla Hoffmaster SPT 11/22/2022, 11:32 AM

## 2022-11-23 ENCOUNTER — Other Ambulatory Visit: Payer: Self-pay

## 2022-11-23 ENCOUNTER — Ambulatory Visit: Payer: Medicare Other

## 2022-11-23 ENCOUNTER — Other Ambulatory Visit: Payer: Medicare Other

## 2022-11-23 DIAGNOSIS — M6281 Muscle weakness (generalized): Secondary | ICD-10-CM

## 2022-11-23 DIAGNOSIS — R262 Difficulty in walking, not elsewhere classified: Secondary | ICD-10-CM | POA: Diagnosis not present

## 2022-11-23 NOTE — Patient Instructions (Signed)
Visit Information  Thank you for taking time to visit with me today. Please don't hesitate to contact me if I can be of assistance to you before our next scheduled telephone appointment.  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 The patient states that he has seen the pain specialist and is considering other options but is having issues now with his AFIB.  Patient now going to outpatient PT 2x/weekly for 8 weeks, states he is improving with ambulation and stated he was able to walk today without AD. Patient completed completed Zio heart monitor trials and awaiting results. Reviewed medications with patient and discussed importance of medication adherence. The patient is compliant with medications;        Reviewed prescribed diet with patient heart healthy/ADA diet. Review of heart healthy/ADA diet. The patient says his swallowing may be a little better but not a lot. 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 The patient has regular lab work. Reviewed with the patient that his A1C is up from 6.5 to  7.0. Education on monitoring for acute changes and calling the pcp for recommendations.;        Discussed plans with patient for ongoing care management follow up and provided patient with direct contact information for care management team. Is appreciative of the CCM team calling and checking on him and helping him with his health and well being;      Provided patient with written educational materials related to hypo and hyperglycemia and importance of correct treatment;       Reviewed scheduled/upcoming provider appointments including: next pcp follow up 01-03-2023. Education provided.  Advised patient, providing education and rationale, to check cbg twice daily and when you have symptoms of low or high blood sugar and record. Denies any acute changes in his blood sugars.       call provider for findings outside established parameters;       Review of patient status, including review of consultants reports, relevant laboratory and other test results, and medications completed;       Advised patient to discuss changes in her DM health and well being with provider;      Screening for signs and symptoms of depression related to chronic disease state;        Assessed social determinant of health barriers;         Symptom Management: Take medications as prescribed   Attend all scheduled provider appointments Call provider office for new concerns or questions  call the Suicide  and Crisis Lifeline: 988 call the [place] National Suicide Prevention Lifeline: 567-883-3770 or TTY: 9011960031 TTY 332-461-2675) to talk to a trained counselor call 1-800-273-TALK (toll free, 24 hour hotline) if experiencing a Mental Health or Behavioral Health Crisis  check feet daily for cuts, sores or redness trim toenails straight across manage portion size wash and dry feet carefully every day wear comfortable, cotton socks wear comfortable, well-fitting shoes  Follow Up Plan: Telephone follow up appointment with  care management team member scheduled for: 01-10-2023 at 345 pm       RNCM Care Management Expected Outcome:  Monitor, Self-Manage and Reduce Symptoms of: CKD       Current Barriers:  Chronic Disease Management support and education needs related to effective management of CKD Lab Results  Component Value Date   CREATININE 1.23 10/11/2022   CREATININE 1.18 10/03/2022   CREATININE 1.23 07/03/2022    Lab Results  Component Value Date   CREATININE 1.23 10/11/2022   BUN 23 10/11/2022   NA 131 (L) 10/11/2022   K 4.3 10/11/2022   CL 99 10/11/2022   CO2 25 10/11/2022     Planned Interventions: Assessed the patient understanding of chronic kidney disease. Denies any acute changes in his CKD or kidney function. States that his kidney function is good.   The patient has stable kidney function at this time. Evaluation of current treatment plan related to chronic kidney disease self management and patient's adherence to plan as established by provider.       Provided education to patient re: stroke prevention, s/s of heart attack and stroke    Reviewed prescribed diet heart healthy/ADA diet. The patient is compliant with dietary restrictions. The patient states he is doing well and watching what he eats. Reviewed medications with patient and discussed importance of compliance. The patient is compliant with medications. Patient states that he has frequent cramping and it was noted that his magnesium supplement was not to be taken with another medication. CM offered Pharm D consult but patient declined.    Advised patient, providing education and rationale, to monitor blood pressure daily and record, calling PCP for findings outside established parameters    Discussed complications of poorly controlled blood pressure such as heart disease, stroke, circulatory complications, vision complications, kidney impairment, sexual dysfunction    Reviewed scheduled/upcoming provider appointments including:  01-03-2023. Advised patient to discuss changes in his kidney function, or questions and concerns related to CKD with provider    Discussed plans with patient for ongoing care management follow up and provided patient with direct contact information for care management team    Screening for signs and symptoms of depression related to chronic disease state      Discussed the impact of chronic kidney disease on daily life and mental health and acknowledged and normalized feelings of disempowerment, fear, and frustration    Assessed social determinant of health barriers    Provided education on kidney disease progression    Engage patient in early, proactive and ongoing discussion about goals of care and what matters most to them    Support coping and stress management by recognizing current strategies and assist in developing new strategies such as mindfulness, journaling, relaxation techniques, problem-solving. The patient is still dealing with ongoing chronic back pain and leg weakness. Stated he was using a heating pad today, reminded him of safe practices when using heating pad, verbalized full understanding. Currently participating in PT treatments 2x/wk.  Denies falls.    Take medications  as prescribed   Attend all scheduled provider appointments Call provider office for new concerns or questions  call the Suicide and Crisis Lifeline: 988 call the [place] National Suicide Prevention Lifeline: (226)649-9925 or TTY: (229) 429-5550 TTY [phone]) to talk to a trained counselor call 1-800-273-TALK (toll free, 24 hour hotline) if experiencing a Mental Health or Behavioral Health Crisis   Follow Up Plan: Telephone follow up appointment with care management team member scheduled for: 01-10-2023 at 345 pm       RNCM Care Management Expected Outcome:  Monitor, Self-Manage, and Reduce Symptoms of Hypertension       Current Barriers:  Knowledge Deficits related to fluctuations in blood pressures and  the need to have normalized blood pressures to prevent the risk of heart attack and stroke Chronic Disease Management support and education needs related to effective management of HTN BP Readings from Last 3 Encounters:  11/13/22 (!) 149/68  11/06/22 131/64  10/27/22 130/62     Planned Interventions: Evaluation of current treatment plan related to hypertension self management and patient's adherence to plan as established by provider. The patient saw the cardiologist recently. Completed Zio heart monitor and is awaiting results. Reflective listening and support given.  Provided education to patient re: stroke prevention, s/s of heart attack and stroke; Reviewed prescribed diet heart healthy/ADA diet. The patient is compliant with dietary restrictions.  Reviewed medications with patient and discussed importance of compliance. The patient states compliance with medications.  Discussed plans with patient for ongoing care management follow up and provided patient with direct contact information for care management team; Advised patient, providing education and rationale, to monitor blood pressure daily and record, calling PCP for findings outside established parameters.   Reviewed scheduled/upcoming provider appointments including:  next pcp appointment on 01-03-2023 at 1020 am. Advised patient to discuss changes in his HTN, or heart health  with provider; Provided education on prescribed diet Heart healthy/ADA diet ;  Discussed complications of poorly controlled blood pressure such as heart disease, stroke, circulatory complications, vision complications, kidney impairment, sexual dysfunction;  Screening for signs and symptoms of depression related to chronic disease state;  Assessed social determinant of health barriers;  The patient is having a lot of different things going on . Is receiving iron infusions and is going to see specialist. Patient will follow up with cardiology following Zio heart  monitor.  Symptom Management: Take medications as prescribed   Attend all scheduled provider appointments Call provider office for new concerns or questions  call the Suicide and Crisis Lifeline: 988 call the [place] National Suicide Prevention Lifeline: 616-033-8260 or TTY: (908) 375-9580 TTY (682)726-8091) to talk to a trained counselor call 1-800-273-TALK (toll free, 24 hour hotline) if experiencing a Mental Health or Behavioral Health Crisis  check blood pressure 3 times per week learn about high blood pressure keep a blood pressure log take blood pressure log to all doctor appointments call doctor for signs and symptoms of high blood pressure develop an action plan for high blood pressure keep all doctor appointments take medications for blood pressure exactly as prescribed report new symptoms to your doctor  Follow Up Plan: Telephone follow up appointment with care management team member scheduled for: 01-10-2023 at 345 pm           Our next appointment is by telephone on 01-10-2023 at 345pm.  Please call the care guide team at [phone] if you need to cancel or reschedule your appointment.   If you are experiencing a Mental Health or  Behavioral Health Crisis or need someone to talk to, please call the Suicide and Crisis Lifeline: 988 call the [place] National Suicide Prevention Lifeline: (548) 148-2551 or TTY: (650) 453-0134 TTY 959-090-3205) to talk to a trained counselor call 1-800-273-TALK (toll free, 24 hour hotline)   Patient verbalizes understanding of instructions and care plan provided today and agrees to view in MyChart. Active MyChart status and patient understanding of how to access instructions and care plan via MyChart confirmed with patient.      Alto Denver RN, MSN, CCM RN Care Manager  [facility]  Direct Number: [phone]

## 2022-11-23 NOTE — Patient Outreach (Signed)
Error in charting.

## 2022-11-23 NOTE — Patient Outreach (Signed)
Care Management   Visit Note  11/23/2022 Name: Sean Reyes. MRN: [id number] DOB: 15-Feb-1940  Subjective: Sean Reyes. is a 82 y.o. year old male who is a primary care patient of [name], Dorie Rank, NP. The Care Management team was consulted for assistance.      Engaged with patient spoke with patient by telephone.    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 The patient states that he has seen the pain specialist and is considering other options but is having issues now with his AFIB.  Patient now going to outpatient PT 2x/weekly for 8 weeks, states he is improving with ambulation and stated he was able to walk today without AD. Patient completed completed Zio heart monitor trials and awaiting results. Reviewed medications with patient and discussed importance of medication adherence. The patient is compliant with medications;        Reviewed prescribed diet with patient heart healthy/ADA diet. Review of heart healthy/ADA diet. The patient says his swallowing may be a little better but not a lot. 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 The patient has regular lab work. Reviewed with the patient that his A1C is up from 6.5 to 7.0. Education on monitoring for acute changes and calling the pcp for recommendations.;        Discussed plans with patient for ongoing care management follow up and provided patient with direct contact information for care management team. Is appreciative of the CCM team calling and checking on him and helping him with his health and well being;      Provided patient with written educational materials related to hypo and hyperglycemia and importance of correct treatment;       Reviewed scheduled/upcoming provider appointments including: next pcp follow up 01-03-2023. Education provided.  Advised patient, providing education and rationale, to check cbg twice daily and when you have symptoms of low or high blood sugar and record. Denies any acute changes in his blood sugars.       call provider for findings outside established parameters;       Review of patient status, including review of consultants reports, relevant laboratory and other test results, and medications completed;       Advised patient to discuss changes in her DM health and well being with provider;      Screening for signs and symptoms of depression related to chronic disease state;        Assessed social determinant of health barriers;         Symptom Management: Take medications as  prescribed   Attend all scheduled provider appointments Call provider office for new concerns or questions  call the Suicide and Crisis Lifeline: 988 call the [place] National Suicide Prevention Lifeline: 916-180-0731 or TTY: (608) 446-3608 TTY (216) 643-3240) to talk to a trained counselor call 1-800-273-TALK (toll free, 24 hour hotline) if experiencing a Mental Health or Behavioral Health Crisis  check feet daily for cuts, sores or redness trim toenails straight across manage portion size wash and dry feet carefully every day wear comfortable,  cotton socks wear comfortable, well-fitting shoes  Follow Up Plan: Telephone follow up appointment with care management team member scheduled for: 01-10-2023 at 345 pm       RNCM Care Management Expected Outcome:  Monitor, Self-Manage and Reduce Symptoms of: CKD       Current Barriers:  Chronic Disease Management support and education needs related to effective management of CKD Lab Results  Component Value Date   CREATININE 1.23 10/11/2022   CREATININE 1.18 10/03/2022   CREATININE 1.23 07/03/2022    Lab Results  Component Value Date   CREATININE 1.23 10/11/2022   BUN 23 10/11/2022   NA 131 (L) 10/11/2022   K 4.3 10/11/2022   CL 99 10/11/2022   CO2 25 10/11/2022     Planned Interventions: Assessed the patient understanding of chronic kidney disease. Denies any acute changes in his CKD or kidney function. States that his kidney function is good.   The patient has stable kidney function at this time. Evaluation of current treatment plan related to chronic kidney disease self management and patient's adherence to plan as established by provider.       Provided education to patient re: stroke prevention, s/s of heart attack and stroke    Reviewed prescribed diet heart healthy/ADA diet. The patient is compliant with dietary restrictions. The patient states he is doing well and watching what he eats. Reviewed medications with patient and discussed importance of compliance. The patient is compliant with medications. Patient states that he has frequent cramping and it was noted that his magnesium supplement was not to be taken with another medication. CM offered Pharm D consult but patient declined.    Advised patient, providing education and rationale, to monitor blood pressure daily and record, calling PCP for findings outside established parameters    Discussed complications of poorly controlled blood pressure such as heart disease, stroke, circulatory complications, vision complications,  kidney impairment, sexual dysfunction    Reviewed scheduled/upcoming provider appointments including: 01-03-2023. Advised patient to discuss changes in his kidney function, or questions and concerns related to CKD with provider    Discussed plans with patient for ongoing care management follow up and provided patient with direct contact information for care management team    Screening for signs and symptoms of depression related to chronic disease state      Discussed the impact of chronic kidney disease on daily life and mental health and acknowledged and normalized feelings of disempowerment, fear, and frustration    Assessed social determinant of health barriers    Provided education on kidney disease progression    Engage patient in early, proactive and ongoing discussion about goals of care and what matters most to them    Support coping and stress management by recognizing current strategies and assist in developing new strategies such as mindfulness, journaling, relaxation techniques, problem-solving. The patient is still dealing with ongoing chronic back pain and leg weakness. Stated he was using a heating pad today, reminded him of safe practices when  using heating pad, verbalized full understanding. Currently participating in PT treatments 2x/wk.  Denies falls.    Take medications as prescribed   Attend all scheduled provider appointments Call provider office for new concerns or questions  call the Suicide and Crisis Lifeline: 988 call the [place] National Suicide Prevention Lifeline: 859-597-4478 or TTY: 437-762-1475 TTY 616-102-2200) to talk to a trained counselor call 1-800-273-TALK (toll free, 24 hour hotline) if experiencing a Mental Health or Behavioral Health Crisis   Follow Up Plan: Telephone follow up appointment with care management team member scheduled for: 01-10-2023 at 345 pm       RNCM Care Management Expected Outcome:  Monitor, Self-Manage, and Reduce Symptoms of  Hypertension       Current Barriers:  Knowledge Deficits related to fluctuations in blood pressures and the need to have normalized blood pressures to prevent the risk of heart attack and stroke Chronic Disease Management support and education needs related to effective management of HTN BP Readings from Last 3 Encounters:  11/13/22 (!) 149/68  11/06/22 131/64  10/27/22 130/62     Planned Interventions: Evaluation of current treatment plan related to hypertension self management and patient's adherence to plan as established by provider. The patient saw the cardiologist recently. Completed Zio heart monitor and is awaiting results. Reflective listening and support given.  Provided education to patient re: stroke prevention, s/s of heart attack and stroke; Reviewed prescribed diet heart healthy/ADA diet. The patient is compliant with dietary restrictions.  Reviewed medications with patient and discussed importance of compliance. The patient states compliance with medications.  Discussed plans with patient for ongoing care management follow up and provided patient with direct contact information for care management team; Advised patient, providing education and rationale, to monitor blood pressure daily and record, calling PCP for findings outside established parameters.   Reviewed scheduled/upcoming provider appointments including:  next pcp appointment on 01-03-2023 at 1020 am. Advised patient to discuss changes in his HTN, or heart health  with provider; Provided education on prescribed diet Heart healthy/ADA diet ;  Discussed complications of poorly controlled blood pressure such as heart disease, stroke, circulatory complications, vision complications, kidney impairment, sexual dysfunction;  Screening for signs and symptoms of depression related to chronic disease state;  Assessed social determinant of health barriers;  The patient is having a lot of different things going on . Is receiving  iron infusions and is going to see specialist. Patient will follow up with cardiology following Zio heart monitor.  Symptom Management: Take medications as prescribed   Attend all scheduled provider appointments Call provider office for new concerns or questions  call the Suicide and Crisis Lifeline: 988 call the [place] National Suicide Prevention Lifeline: 907-656-3383 or TTY: 708-479-0193 TTY 667-089-4718) to talk to a trained counselor call 1-800-273-TALK (toll free, 24 hour hotline) if experiencing a Mental Health or Behavioral Health Crisis  check blood pressure 3 times per week learn about high blood pressure keep a blood pressure log take blood pressure log to all doctor appointments call doctor for signs and symptoms of high blood pressure develop an action plan for high blood pressure keep all doctor appointments take medications for blood pressure exactly as prescribed report new symptoms to your doctor  Follow Up Plan: Telephone follow up appointment with care management team member scheduled for: 01-10-2023 at 345 pm             Consent to Services:  Patient was given information about care management services, agreed to services, and  gave verbal consent to participate.   Plan: Telephone follow up appointment with care management team member scheduled for: 01-10-2023 3:45pm  Alto Denver RN, MSN, CCM RN Care Manager  [facility]  Ambulatory Care Management  Direct Number: [phone]

## 2022-11-23 NOTE — Therapy (Addendum)
OUTPATIENT PHYSICAL THERAPY BALANCE TREATMENT  Patient Name: Sean Reyes. MRN: [id number] DOB:11/13/1940, 82 y.o., male Today's Date: 11/23/2022  END OF SESSION:  PT End of Session - 11/23/22 1151     Visit Number 5    Number of Visits 17    Date for PT Re-Evaluation 12/28/22    Authorization Type eval: 11/02/2022    PT Start Time 1149    PT Stop Time 1230    PT Time Calculation (min) 41 min    Equipment Utilized During Treatment Gait belt    Activity Tolerance Patient tolerated treatment well    Behavior During Therapy WFL for tasks assessed/performed            Past Medical History:  Diagnosis Date   Anemia    Anxiety    Arthritis    Arthritis of neck    Atrial fibrillation (HCC)    Cataracts, bilateral    Complication of anesthesia    pt reports low BP's after surgery at [facility] and difficulty awakening   Depression    Diabetes (HCC)    dx 6-8 yrs ago   Dysrhythmia    a-fib   GERD (gastroesophageal reflux disease)    OCC TAKES ALKA SELTZER   History of kidney stones    10-15 yrs ago   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   Hyperlipidemia    Hypertension    Myocardial infarction (HCC) 12/2020   Nocturia    S/P ablation of atrial fibrillation    Ablative therapy   Sleep apnea    CPAP    Spinal stenosis    Tachycardia, unspecified    Past Surgical History:  Procedure Laterality Date   ABLATION     ANTERIOR LAT LUMBAR FUSION N/A 06/27/2017   Procedure: Anterior Lateral Lumbar Interbody  Fusion - Lumbar Two-Lumbar Three - Lumbar Three-Lumbar Four, Posterior Lumbar Interbody Fusion Lumbar Four- Five;  Surgeon: Donalee Citrin, MD;  Location: [facility] OR;  Service: Neurosurgery;  Laterality: N/A;  Anterior Lateral Lumbar Interbody  Fusion - Lumbar Two-Lumbar Three - Lumbar Three-Lumbar Four, Posterior Lumbar Interbody Fusion Lumbar Four- Five   BACK SURGERY     CARDIAC CATHETERIZATION     CARDIOVERSION N/A 08/29/2018   Procedure: CARDIOVERSION;   Surgeon: Lamar Blinks, MD;  Location: [facility] ORS;  Service: Cardiovascular;  Laterality: N/A;   CARDIOVERSION N/A 09/24/2018   Procedure: CARDIOVERSION;  Surgeon: Lamar Blinks, MD;  Location: [facility] ORS;  Service: Cardiovascular;  Laterality: N/A;   COLONOSCOPY WITH PROPOFOL N/A 10/05/2015   Procedure: COLONOSCOPY WITH PROPOFOL;  Surgeon: Christena Deem, MD;  Location: [facility] ENDOSCOPY;  Service: Endoscopy;  Laterality: N/A;   COLONOSCOPY WITH PROPOFOL N/A 11/01/2020   Procedure: COLONOSCOPY WITH PROPOFOL;  Surgeon: Wyline Mood, MD;  Location: [facility] ENDOSCOPY;  Service: Gastroenterology;  Laterality: N/A;   CORONARY STENT INTERVENTION N/A 12/22/2020   Procedure: CORONARY STENT INTERVENTION;  Surgeon: Marcina Millard, MD;  Location: [facility] INVASIVE CV LAB;  Service: Cardiovascular;  Laterality: N/A;   ESOPHAGOGASTRODUODENOSCOPY N/A 11/01/2020   Procedure: ESOPHAGOGASTRODUODENOSCOPY (EGD);  Surgeon: Wyline Mood, MD;  Location: [facility] ENDOSCOPY;  Service: Gastroenterology;  Laterality: N/A;   ESOPHAGOGASTRODUODENOSCOPY (EGD) WITH PROPOFOL N/A 04/01/2018   Procedure: ESOPHAGOGASTRODUODENOSCOPY (EGD) WITH PROPOFOL;  Surgeon: Christena Deem, MD;  Location: [facility] ENDOSCOPY;  Service: Endoscopy;  Laterality: N/A;   ESOPHAGOGASTRODUODENOSCOPY (EGD) WITH PROPOFOL N/A 06/01/2022   Procedure: ESOPHAGOGASTRODUODENOSCOPY (EGD) WITH PROPOFOL;  Surgeon: Wyline Mood, MD;  Location: [facility] ENDOSCOPY;  Service: Gastroenterology;  Laterality: N/A;   EYE SURGERY     HERNIA REPAIR     JOINT REPLACEMENT Bilateral    hips  RT+  LEFT X2    LEFT HEART CATH AND CORONARY ANGIOGRAPHY N/A 12/22/2020   Procedure: LEFT HEART CATH AND CORONARY ANGIOGRAPHY;  Surgeon: Marcina Millard, MD;  Location: [facility] INVASIVE CV LAB;  Service: Cardiovascular;  Laterality: N/A;   LEFT HEART CATH AND CORONARY ANGIOGRAPHY Left 08/23/2021   Procedure: LEFT HEART CATH AND CORONARY ANGIOGRAPHY;  Surgeon: Lamar Blinks, MD;   Location: [facility] INVASIVE CV LAB;  Service: Cardiovascular;  Laterality: Left;   LUMBAR LAMINECTOMY/DECOMPRESSION MICRODISCECTOMY Left 09/13/2016   Procedure: Microdiscectomy - Lumbar two-three,  Lumbar three- - left;  Surgeon: Donalee Citrin, MD;  Location: [facility] OR;  Service: Neurosurgery;  Laterality: Left;   SPINAL CORD STIMULATOR INSERTION  07/08/2019   TONSILLECTOMY     Patient Active Problem List   Diagnosis Date Noted   Finger numbness 10/13/2022   Subareolar mass of left breast 10/03/2022   COPD (chronic obstructive pulmonary disease) (HCC) 10/31/2021   Shortness of breath on exertion 08/04/2021   Diabetes mellitus with proteinuria (HCC) 04/04/2021   GERD without esophagitis 04/02/2021   Coronary artery disease 12/28/2020   History of non-ST elevation myocardial infarction (NSTEMI) 12/21/2020   Pain in right shin 10/25/2020   Peripheral vascular disease (HCC) 08/06/2020   Chronic pain syndrome 07/06/2020   Cervical facet joint syndrome 04/08/2020   Spinal cord stimulator status 12/11/2019   Elevated TSH 08/20/2019   CKD (chronic kidney disease) stage 3, GFR 30-59 ml/min (HCC) 01/19/2019   Acquired thrombophilia (HCC) 01/19/2019   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   Postlaminectomy syndrome, lumbar region 01/16/2019   History of fusion of lumbar spine (L2-L5) 01/16/2019   Chronic radicular lumbar pain 01/16/2019   HNP (herniated nucleus pulposus), lumbar 04/29/2018   Advanced care planning/counseling discussion 09/28/2016   Spinal stenosis, lumbar region, 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/13/2016   Hyperlipidemia associated with type 2 diabetes mellitus (HCC) 07/14/2015   Symptomatic anemia 06/30/2015   Benign prostatic hyperplasia without lower urinary tract symptoms 06/02/2015   OSA (obstructive sleep apnea) 03/23/2015   Hypertension associated with diabetes (HCC) 09/28/2014   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HCC) 09/28/2014   H/O prior ablation treatment  10/19/2011   AF (paroxysmal atrial fibrillation) (HCC) 10/19/2011   PCP: Marjie Skiff, NP  REFERRING PROVIDER: Lonell Face, MD   REFERRING DIAG: R26.89 (ICD-10-CM) - Balance problem   RATIONALE FOR EVALUATION AND TREATMENT: Rehabilitation  THERAPY DIAG: Muscle weakness (generalized)  Difficulty in walking, not elsewhere classified  ONSET DATE: 2021  FOLLOW-UP APPT SCHEDULED WITH REFERRING PROVIDER: Yes    SUBJECTIVE:  SUBJECTIVE STATEMENT:  Leg pain, Difficulty with Walking, Imbalance   PERTINENT HISTORY:   Per Chart Review:   10/17/2022 "Patient reported he is not doing well lately. He has been seeing Dr. Myer Haff. He reports his legs are so weak that he can hardly walk, so he has been using a crutch for the last 1-2 years. Reported numbness and loss of sensation in his toes. He also reported that recently his left hand has began to has some numbness to where it feels like it is asleep. Denies pain, loss of sensation, coldness, burning. 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 "  Today:  Patient reports to physical therapy today with a chief concern of muscle weakness and imbalance. Patient reports progressive worsening in balance and muscular strength. He states that he has loss of sensation in his toes he reports they are "asleep". Patient reports pain in the anterior thigh and bilateral calves. He currently  uses an loftstand crutch in his RUE. Lately patient has reported difficulty with dressing LE, performing fine motor movements with left hand. He denies falls, saddle parasthesia, nausea, vomitting, night sweats.       Pain: Yes Numbness/Tingling: Yes Focal Weakness: Yes Recent changes in overall health/medication: No Prior history of physical therapy for balance:  Yes Dominant hand: right Imaging: Yes  Red flags: Negative for bowel/bladder changes, saddle paresthesia, personal history of cancer, h/o spinal tumors, h/o compression fx, h/o abdominal aneurysm, abdominal pain, chills/fever, night sweats, nausea, vomiting, unrelenting pain, first onset of insidious LBP <22 y/o  PRECAUTIONS: Fall  WEIGHT BEARING RESTRICTIONS: No  FALLS: Has patient fallen in last 6 months? 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  Recent memory is intact.  Remote memory is intact.  Attention span and concentration are intact.  Expressive speech is intact.  Patient's fund of knowledge is within normal limits for educational level.  Gross Musculoskeletal Assessment Tremor: None Bulk: Normal Tone: Normal  Posture: Rounded forward posture in seated. Standing posture: Forward and rounded in standing, limited  cervicothoracic extension.   AROM Deferred  LE MMT: MMT (out of 5) Right  Left   Hip flexion 4 4-  Hip extension    Hip abduction    Hip adduction    Hip internal rotation 5 4-  Hip external rotation 5 4*  Knee flexion    Knee extension 4+ 4+  Ankle dorsiflexion 4- 4-  Ankle plantarflexion 5 5  Ankle inversion    Ankle eversion    (* = pain; Blank rows =  not tested)  Sensation Grossly intact to light touch throughout bilateral LEs as determined by testing dermatomes L2-S2. Proprioception, stereognosis, and hot/cold testing deferred on this date.  Reflexes R/L Knee Jerk (L3/4): 2+/2+  Ankle Jerk (S1/2): 2+/2+   Cranial Nerves Deferred  Coordination/Cerebellar Finger to Nose: WNL Heel to Shin: Deferred Rapid alternating movements: WNL Finger Opposition: WNL Pronator Drift: Negative  Bed mobility: Deferred   Transfers: Assistive device utilized: None  Sit to stand: Complete Independence Stand to sit: Complete Independence Chair to chair: SBA Slowed, required few attempts at stand before transfer, definite use of hands  Floor: Deferred  Curb:  Deferred  Stairs: Deferred  Gait: Gait pattern: step through pattern Distance walked: 27m Assistive device utilized: Crutches Level of assistance: Complete Independence Comments: Slowed, Cautious gait with use of loftstrand crutch in RUE.   Functional Outcome Measures  Results Comments  BERG 39/56 2,4,4,3,3,4,4,3,3,2,2,2,2,1  DGI    FGA    TUG    5TSTS 16.65s  With UE support, unable to perform without BUE support.   6 Minute Walk Test    10 Meter Gait Speed Self-selected: 18s = .55 m/s;  Loftstrand RUE  (Blank rows = not tested)   TODAY'S TREATMENT   Subjective: Patient arrives to physical therapy without new reports. He is experiencing pain in his lower back limiting activities around the house. No further questions or concerns.   Pain: 6/10 Lower Back   Neuromuscular Re-education: NuStep Level  0-1 x 8 min for warm up and history interval, heat pack applied to lumbar region;   Seated marches with 4# AW on Ankles, 2 x 15 ea leg;  Seated LAQ with 4# AW 2 x 15 ea leg;   6" Step Taps with BUE Support 2 x 12;  12" Step Tap with no UE Support 2 x 12; Nautilus Resisted Gait in Forward, Sideways, Retro direction in agility ladder x 1 trial ea direction;  Long Distance walking without AD to improve endurance and step length (~153ft); Weighted Ball Toss to rebounder, Standing on Airex 2 x 10 (4#, 6#)   Not Performed: Sit to Stand 1 x 8 (without UE support);  Sit to Stand with Airex 1 x 8 (without UE support);  Agility Ladder Forward Sideways and Retro stepping pattern x 2 trials 6" Step Taps with/without BUE Support 2 x 20 reps; 6" Step Taps on Airexa without BUE Support 1 x 20 reps; 12" Step without BUE Support 1 x 20 reps;  Forward Stepping Over 12" hurdles in // bars x multiple trials;   PATIENT EDUCATION:  Education details: HEP and Plan of Care Person educated: Patient  Education method: Explanation and Handouts Education comprehension: verbalized understanding   HOME EXERCISE PROGRAM:  Access Code: [email or web address] URL: [email or web address] Date: 11/02/2022 Prepared by: Ria Comment  Exercises - Seated Long Arc Quad  - 1 x daily - 7 x weekly - 1-3 sets - 10 reps - Heel Raises with Counter Support  - 1 x daily - 7 x weekly - 1-3 sets - 10 reps - Semi-Tandem Balance at [employer] Eyes Open  - 1 x daily - 7 x weekly - 2-3 sets - 10 reps - 10-30 hold   ASSESSMENT:  CLINICAL IMPRESSION: Today's session with a main focus on improving dynamic balance. He continues to demonstrate improved dynamic balance; able to maintain balance with tasks requiring longer single leg stance. Patient demonstrated good ability to maintain static balance with external perturbations without loss of baalnce. Patient demonstrated ability  to walk longer distances without use of AD and  improved lumbar pain. PT encouraged pt to continue HEP and stretches. Pt will continue benefit from PT services to address deficits in strength, balance, and mobility in order to return to full function at home.  OBJECTIVE IMPAIRMENTS: decreased activity tolerance, decreased balance, decreased endurance, decreased mobility, difficulty walking, decreased strength, and pain.   ACTIVITY LIMITATIONS: carrying, lifting, bending, standing, squatting, stairs, transfers, and dressing  PARTICIPATION LIMITATIONS: cleaning, shopping, community activity, and yard work  PERSONAL FACTORS: Age, Fitness, Past/current experiences, Time since onset of injury/illness/exacerbation, and 3+ comorbidities: Peripheral neuropathy, SOB, COPD  are also affecting patient's functional outcome.   REHAB POTENTIAL: Good  CLINICAL DECISION MAKING: Evolving/moderate complexity  EVALUATION COMPLEXITY: Moderate   GOALS: Goals reviewed with patient? No  SHORT TERM GOALS: Target date: 12/21/2022   Pt will be independent with HEP in order to improve strength and balance in order to decrease fall risk and improve function at home. Baseline:  Goal status: INITIAL   LONG TERM GOALS: Target date: 01/18/2023  Pt will increase FOTO to at least 48 to demonstrate significant improvement in function at home related to balance  Baseline: 11/02/2022: 40 Goal status: INITIAL  2.  Pt will improve BERG by at least 3 points in order to demonstrate clinically significant improvement in balance.   Baseline: 11/02/2022: 39/56 Goal status: INITIAL  3.  Pt will improve ABC by at least 13% in order to demonstrate clinically significant improvement in balance confidence.      Baseline: 11/02/2022: 17.5% Goal status: INITIAL  4. Pt will decrease 5TSTS by at least 3 seconds in order to demonstrate clinically significant improvement in LE strength      Baseline: 11/02/2022: 16.65s Goal status: INITIAL  PLAN: PT FREQUENCY: 2x/week  PT  DURATION: 8 weeks  PLANNED INTERVENTIONS: Therapeutic exercises, Therapeutic activity, Neuromuscular re-education, Balance training, Gait training, Patient/Family education, Self Care, Joint mobilization, Joint manipulation, Vestibular training, Canalith repositioning, Orthotic/Fit training, DME instructions, Dry Needling, Electrical stimulation, Spinal manipulation, Spinal mobilization, Cryotherapy, Moist heat, Taping, Traction, Ultrasound, Ionotophoresis 4mg /ml Dexamethasone, Manual therapy, and Re-evaluation.  PLAN FOR NEXT SESSION: Hip and Knee ROM, Initiate strengthening and balance program  Lynnea Maizes PT, DPT, GCS  Stacey Maura SPT 11/23/2022, 5:16 PM

## 2022-11-28 ENCOUNTER — Ambulatory Visit: Payer: Medicare Other

## 2022-11-28 DIAGNOSIS — R262 Difficulty in walking, not elsewhere classified: Secondary | ICD-10-CM

## 2022-11-28 DIAGNOSIS — M6281 Muscle weakness (generalized): Secondary | ICD-10-CM

## 2022-11-28 NOTE — Therapy (Addendum)
OUTPATIENT PHYSICAL THERAPY BALANCE TREATMENT  Patient Name: Sean Reyes. MRN: [id number] DOB:27-Feb-1940, 82 y.o., male Today's Date: 11/28/2022  END OF SESSION:  PT End of Session - 11/28/22 1150     Visit Number 6    Number of Visits 17    Date for PT Re-Evaluation 12/28/22    Authorization Type eval: 11/02/2022    PT Start Time 1147    PT Stop Time 1230    PT Time Calculation (min) 43 min    Equipment Utilized During Treatment Gait belt    Activity Tolerance Patient tolerated treatment well    Behavior During Therapy WFL for tasks assessed/performed            Past Medical History:  Diagnosis Date   Anemia    Anxiety    Arthritis    Arthritis of neck    Atrial fibrillation (HCC)    Cataracts, bilateral    Complication of anesthesia    pt reports low BP's after surgery at [facility] and difficulty awakening   Depression    Diabetes (HCC)    dx 6-8 yrs ago   Dysrhythmia    a-fib   GERD (gastroesophageal reflux disease)    OCC TAKES ALKA SELTZER   History of kidney stones    10-15 yrs ago   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   Hyperlipidemia    Hypertension    Myocardial infarction (HCC) 12/2020   Nocturia    S/P ablation of atrial fibrillation    Ablative therapy   Sleep apnea    CPAP    Spinal stenosis    Tachycardia, unspecified    Past Surgical History:  Procedure Laterality Date   ABLATION     ANTERIOR LAT LUMBAR FUSION N/A 06/27/2017   Procedure: Anterior Lateral Lumbar Interbody  Fusion - Lumbar Two-Lumbar Three - Lumbar Three-Lumbar Four, Posterior Lumbar Interbody Fusion Lumbar Four- Five;  Surgeon: Donalee Citrin, MD;  Location: [facility] OR;  Service: Neurosurgery;  Laterality: N/A;  Anterior Lateral Lumbar Interbody  Fusion - Lumbar Two-Lumbar Three - Lumbar Three-Lumbar Four, Posterior Lumbar Interbody Fusion Lumbar Four- Five   BACK SURGERY     CARDIAC CATHETERIZATION     CARDIOVERSION N/A 08/29/2018   Procedure: CARDIOVERSION;   Surgeon: Lamar Blinks, MD;  Location: [facility] ORS;  Service: Cardiovascular;  Laterality: N/A;   CARDIOVERSION N/A 09/24/2018   Procedure: CARDIOVERSION;  Surgeon: Lamar Blinks, MD;  Location: [facility] ORS;  Service: Cardiovascular;  Laterality: N/A;   COLONOSCOPY WITH PROPOFOL N/A 10/05/2015   Procedure: COLONOSCOPY WITH PROPOFOL;  Surgeon: Christena Deem, MD;  Location: [facility] ENDOSCOPY;  Service: Endoscopy;  Laterality: N/A;   COLONOSCOPY WITH PROPOFOL N/A 11/01/2020   Procedure: COLONOSCOPY WITH PROPOFOL;  Surgeon: Wyline Mood, MD;  Location: [facility] ENDOSCOPY;  Service: Gastroenterology;  Laterality: N/A;   CORONARY STENT INTERVENTION N/A 12/22/2020   Procedure: CORONARY STENT INTERVENTION;  Surgeon: Marcina Millard, MD;  Location: [facility] INVASIVE CV LAB;  Service: Cardiovascular;  Laterality: N/A;   ESOPHAGOGASTRODUODENOSCOPY N/A 11/01/2020   Procedure: ESOPHAGOGASTRODUODENOSCOPY (EGD);  Surgeon: Wyline Mood, MD;  Location: [facility] ENDOSCOPY;  Service: Gastroenterology;  Laterality: N/A;   ESOPHAGOGASTRODUODENOSCOPY (EGD) WITH PROPOFOL N/A 04/01/2018   Procedure: ESOPHAGOGASTRODUODENOSCOPY (EGD) WITH PROPOFOL;  Surgeon: Christena Deem, MD;  Location: [facility] ENDOSCOPY;  Service: Endoscopy;  Laterality: N/A;   ESOPHAGOGASTRODUODENOSCOPY (EGD) WITH PROPOFOL N/A 06/01/2022   Procedure: ESOPHAGOGASTRODUODENOSCOPY (EGD) WITH PROPOFOL;  Surgeon: Wyline Mood, MD;  Location: [facility] ENDOSCOPY;  Service: Gastroenterology;  Laterality: N/A;   EYE SURGERY     HERNIA REPAIR     JOINT REPLACEMENT Bilateral    hips  RT+  LEFT X2    LEFT HEART CATH AND CORONARY ANGIOGRAPHY N/A 12/22/2020   Procedure: LEFT HEART CATH AND CORONARY ANGIOGRAPHY;  Surgeon: Marcina Millard, MD;  Location: [facility] INVASIVE CV LAB;  Service: Cardiovascular;  Laterality: N/A;   LEFT HEART CATH AND CORONARY ANGIOGRAPHY Left 08/23/2021   Procedure: LEFT HEART CATH AND CORONARY ANGIOGRAPHY;  Surgeon: Lamar Blinks, MD;   Location: [facility] INVASIVE CV LAB;  Service: Cardiovascular;  Laterality: Left;   LUMBAR LAMINECTOMY/DECOMPRESSION MICRODISCECTOMY Left 09/13/2016   Procedure: Microdiscectomy - Lumbar two-three,  Lumbar three- - left;  Surgeon: Donalee Citrin, MD;  Location: [facility] OR;  Service: Neurosurgery;  Laterality: Left;   SPINAL CORD STIMULATOR INSERTION  07/08/2019   TONSILLECTOMY     Patient Active Problem List   Diagnosis Date Noted   Finger numbness 10/13/2022   Subareolar mass of left breast 10/03/2022   COPD (chronic obstructive pulmonary disease) (HCC) 10/31/2021   Shortness of breath on exertion 08/04/2021   Diabetes mellitus with proteinuria (HCC) 04/04/2021   GERD without esophagitis 04/02/2021   Coronary artery disease 12/28/2020   History of non-ST elevation myocardial infarction (NSTEMI) 12/21/2020   Pain in right shin 10/25/2020   Peripheral vascular disease (HCC) 08/06/2020   Chronic pain syndrome 07/06/2020   Cervical facet joint syndrome 04/08/2020   Spinal cord stimulator status 12/11/2019   Elevated TSH 08/20/2019   CKD (chronic kidney disease) stage 3, GFR 30-59 ml/min (HCC) 01/19/2019   Acquired thrombophilia (HCC) 01/19/2019   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   Postlaminectomy syndrome, lumbar region 01/16/2019   History of fusion of lumbar spine (L2-L5) 01/16/2019   Chronic radicular lumbar pain 01/16/2019   HNP (herniated nucleus pulposus), lumbar 04/29/2018   Advanced care planning/counseling discussion 09/28/2016   Spinal stenosis, lumbar region, 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/13/2016   Hyperlipidemia associated with type 2 diabetes mellitus (HCC) 07/14/2015   Symptomatic anemia 06/30/2015   Benign prostatic hyperplasia without lower urinary tract symptoms 06/02/2015   OSA (obstructive sleep apnea) 03/23/2015   Hypertension associated with diabetes (HCC) 09/28/2014   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HCC) 09/28/2014   H/O prior ablation treatment  10/19/2011   AF (paroxysmal atrial fibrillation) (HCC) 10/19/2011   PCP: Marjie Skiff, NP  REFERRING PROVIDER: Lonell Face, MD   REFERRING DIAG: R26.89 (ICD-10-CM) - Balance problem   RATIONALE FOR EVALUATION AND TREATMENT: Rehabilitation  THERAPY DIAG: Muscle weakness (generalized)  Difficulty in walking, not elsewhere classified  ONSET DATE: 2021  FOLLOW-UP APPT SCHEDULED WITH REFERRING PROVIDER: Yes    SUBJECTIVE:  SUBJECTIVE STATEMENT:  Leg pain, Difficulty with Walking, Imbalance   PERTINENT HISTORY:   Per Chart Review:   10/17/2022 "Patient reported he is not doing well lately. He has been seeing Dr. Myer Haff. He reports his legs are so weak that he can hardly walk, so he has been using a crutch for the last 1-2 years. Reported numbness and loss of sensation in his toes. He also reported that recently his left hand has began to has some numbness to where it feels like it is asleep. Denies pain, loss of sensation, coldness, burning. 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 "  Today:  Patient reports to physical therapy today with a chief concern of muscle weakness and imbalance. Patient reports progressive worsening in balance and muscular strength. He states that he has loss of sensation in his toes he reports they are "asleep". Patient reports pain in the anterior thigh and bilateral calves. He currently  uses an loftstand crutch in his RUE. Lately patient has reported difficulty with dressing LE, performing fine motor movements with left hand. He denies falls, saddle parasthesia, nausea, vomitting, night sweats.       Pain: Yes Numbness/Tingling: Yes Focal Weakness: Yes Recent changes in overall health/medication: No Prior history of physical therapy for balance:  Yes Dominant hand: right Imaging: Yes  Red flags: Negative for bowel/bladder changes, saddle paresthesia, personal history of cancer, h/o spinal tumors, h/o compression fx, h/o abdominal aneurysm, abdominal pain, chills/fever, night sweats, nausea, vomiting, unrelenting pain, first onset of insidious LBP <22 y/o  PRECAUTIONS: Fall  WEIGHT BEARING RESTRICTIONS: No  FALLS: Has patient fallen in last 6 months? 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  Recent memory is intact.  Remote memory is intact.  Attention span and concentration are intact.  Expressive speech is intact.  Patient's fund of knowledge is within normal limits for educational level.  Gross Musculoskeletal Assessment Tremor: None Bulk: Normal Tone: Normal  Posture: Rounded forward posture in seated. Standing posture: Forward and rounded in standing, limited  cervicothoracic extension.   AROM Deferred  LE MMT: MMT (out of 5) Right  Left   Hip flexion 4 4-  Hip extension    Hip abduction    Hip adduction    Hip internal rotation 5 4-  Hip external rotation 5 4*  Knee flexion    Knee extension 4+ 4+  Ankle dorsiflexion 4- 4-  Ankle plantarflexion 5 5  Ankle inversion    Ankle eversion    (* = pain; Blank rows =  not tested)  Sensation Grossly intact to light touch throughout bilateral LEs as determined by testing dermatomes L2-S2. Proprioception, stereognosis, and hot/cold testing deferred on this date.  Reflexes R/L Knee Jerk (L3/4): 2+/2+  Ankle Jerk (S1/2): 2+/2+   Cranial Nerves Deferred  Coordination/Cerebellar Finger to Nose: WNL Heel to Shin: Deferred Rapid alternating movements: WNL Finger Opposition: WNL Pronator Drift: Negative  Bed mobility: Deferred   Transfers: Assistive device utilized: None  Sit to stand: Complete Independence Stand to sit: Complete Independence Chair to chair: SBA Slowed, required few attempts at stand before transfer, definite use of hands  Floor: Deferred  Curb:  Deferred  Stairs: Deferred  Gait: Gait pattern: step through pattern Distance walked: 8m Assistive device utilized: Crutches Level of assistance: Complete Independence Comments: Slowed, Cautious gait with use of loftstrand crutch in RUE.   Functional Outcome Measures  Results Comments  BERG 39/56 2,4,4,3,3,4,4,3,3,2,2,2,2,1  DGI    FGA    TUG    5TSTS 16.65s  With UE support, unable to perform without BUE support.   6 Minute Walk Test    10 Meter Gait Speed Self-selected: 18s = .55 m/s;  Loftstrand RUE  (Blank rows = not tested)   TODAY'S TREATMENT   Subjective: Patient arrives to physical therapy without new reports. Patient reports less pain in the lower back today. No further questions or concerns.   Pain: 3/10 Lower Back   Neuromuscular Re-education: NuStep Level 0-1 x 6 min for warm up and  history interval, heat pack applied to lumbar region;   Sit to Stands (No UE support) 2 x 10;  Seated LAQ with 5# Ankle Weight 1 x 15 ea leg;  Standing Marches with 5# AW 2 x 10 ea leg;  Obstacle Clearance over x2 12" hurdle and "6 hurdle x 3 trials;  Side Stepping with Yellow Theraband in agility ladder 2 x 3 trials;  - Added bending and reaching task with second set Retro Stepping in agility ladder x multiple trials;  Forward, Retro, Side Stepping onto airex pad, PT called directional patterns x 1 min;   Not Performed: Sit to Stand 1 x 8 (without UE support);  Sit to Stand with Airex 1 x 8 (without UE support);  Agility Ladder Forward Sideways and Retro stepping pattern x 2 trials 6" Step Taps with/without BUE Support 2 x 20 reps; 6" Step Taps on Airexa without BUE Support 1 x 20 reps; 12" Step without BUE Support 1 x 20 reps;  Forward Stepping Over 12" hurdles in // bars x multiple trials; Long Distance walking without AD to improve endurance and step length (~134ft);  PATIENT EDUCATION:  Education details: HEP and Plan of Care Person educated: Patient  Education method: Explanation and Handouts Education comprehension: verbalized understanding   HOME EXERCISE PROGRAM:  Access Code: [email or web address] URL: [email or web address] Date: 11/02/2022 Prepared by: Ria Comment  Exercises - Seated Long Arc Quad  - 1 x daily - 7 x weekly - 1-3 sets - 10 reps - Heel Raises with Counter Support  - 1 x daily - 7 x weekly - 1-3 sets - 10 reps - Semi-Tandem Balance at [employer] Eyes Open  - 1 x daily - 7 x weekly - 2-3 sets - 10 reps - 10-30 hold   ASSESSMENT:  CLINICAL IMPRESSION: Today's session with a main focus on improving dynamic balance. He continues to demonstrate improved dynamic balance. Patient demonstrated improved LE strength; performed sit to stands without UE support and increased repetitions. Patient tolerated increased  resistance and perturbations with side  stepping exercises; no major LOB. Pt continues to be limited with endurance tasks due to lower back pain. PT encouraged pt to continue HEP and stretches. Pt will continue benefit from PT services to address deficits in strength, balance, and mobility in order to return to full function at home.  OBJECTIVE IMPAIRMENTS: decreased activity tolerance, decreased balance, decreased endurance, decreased mobility, difficulty walking, decreased strength, and pain.   ACTIVITY LIMITATIONS: carrying, lifting, bending, standing, squatting, stairs, transfers, and dressing  PARTICIPATION LIMITATIONS: cleaning, shopping, community activity, and yard work  PERSONAL FACTORS: Age, Fitness, Past/current experiences, Time since onset of injury/illness/exacerbation, and 3+ comorbidities: Peripheral neuropathy, SOB, COPD  are also affecting patient's functional outcome.   REHAB POTENTIAL: Good  CLINICAL DECISION MAKING: Evolving/moderate complexity  EVALUATION COMPLEXITY: Moderate   GOALS: Goals reviewed with patient? No  SHORT TERM GOALS: Target date: 12/26/2022   Pt will be independent with HEP in order to improve strength and balance in order to decrease fall risk and improve function at home. Baseline:  Goal status: INITIAL   LONG TERM GOALS: Target date: 01/23/2023  Pt will increase FOTO to at least 48 to demonstrate significant improvement in function at home related to balance  Baseline: 11/02/2022: 40 Goal status: INITIAL  2.  Pt will improve BERG by at least 3 points in order to demonstrate clinically significant improvement in balance.   Baseline: 11/02/2022: 39/56 Goal status: INITIAL  3.  Pt will improve ABC by at least 13% in order to demonstrate clinically significant improvement in balance confidence.      Baseline: 11/02/2022: 17.5% Goal status: INITIAL  4. Pt will decrease 5TSTS by at least 3 seconds in order to demonstrate clinically significant improvement in LE strength       Baseline: 11/02/2022: 16.65s Goal status: INITIAL  PLAN: PT FREQUENCY: 2x/week  PT DURATION: 8 weeks  PLANNED INTERVENTIONS: Therapeutic exercises, Therapeutic activity, Neuromuscular re-education, Balance training, Gait training, Patient/Family education, Self Care, Joint mobilization, Joint manipulation, Vestibular training, Canalith repositioning, Orthotic/Fit training, DME instructions, Dry Needling, Electrical stimulation, Spinal manipulation, Spinal mobilization, Cryotherapy, Moist heat, Taping, Traction, Ultrasound, Ionotophoresis 4mg /ml Dexamethasone, Manual therapy, and Re-evaluation.  PLAN FOR NEXT SESSION: review and modify strengthening and balance program  Lynnea Maizes PT, DPT, GCS  Gladyce Mcray SPT 11/28/2022, 3:19 PM

## 2022-11-30 ENCOUNTER — Ambulatory Visit: Payer: Medicare Other

## 2022-11-30 DIAGNOSIS — R262 Difficulty in walking, not elsewhere classified: Secondary | ICD-10-CM

## 2022-11-30 DIAGNOSIS — M6281 Muscle weakness (generalized): Secondary | ICD-10-CM | POA: Diagnosis not present

## 2022-11-30 NOTE — Therapy (Addendum)
OUTPATIENT PHYSICAL THERAPY BALANCE TREATMENT  Patient Name: Bohumil Kerkman. MRN: [id number] DOB:24-Sep-1940, 82 y.o., male Today's Date: 12/01/2022  END OF SESSION:  PT End of Session - 11/30/22 1137     Visit Number 7    Number of Visits 17    Date for PT Re-Evaluation 12/28/22    Authorization Type eval: 11/02/2022    PT Start Time 1142    PT Stop Time 1221    PT Time Calculation (min) 39 min    Equipment Utilized During Treatment Gait belt    Activity Tolerance Patient tolerated treatment well    Behavior During Therapy WFL for tasks assessed/performed            Past Medical History:  Diagnosis Date   Anemia    Anxiety    Arthritis    Arthritis of neck    Atrial fibrillation (HCC)    Cataracts, bilateral    Complication of anesthesia    pt reports low BP's after surgery at [facility] and difficulty awakening   Depression    Diabetes (HCC)    dx 6-8 yrs ago   Dysrhythmia    a-fib   GERD (gastroesophageal reflux disease)    OCC TAKES ALKA SELTZER   History of kidney stones    10-15 yrs ago   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   Hyperlipidemia    Hypertension    Myocardial infarction (HCC) 12/2020   Nocturia    S/P ablation of atrial fibrillation    Ablative therapy   Sleep apnea    CPAP    Spinal stenosis    Tachycardia, unspecified    Past Surgical History:  Procedure Laterality Date   ABLATION     ANTERIOR LAT LUMBAR FUSION N/A 06/27/2017   Procedure: Anterior Lateral Lumbar Interbody  Fusion - Lumbar Two-Lumbar Three - Lumbar Three-Lumbar Four, Posterior Lumbar Interbody Fusion Lumbar Four- Five;  Surgeon: Donalee Citrin, MD;  Location: [facility] OR;  Service: Neurosurgery;  Laterality: N/A;  Anterior Lateral Lumbar Interbody  Fusion - Lumbar Two-Lumbar Three - Lumbar Three-Lumbar Four, Posterior Lumbar Interbody Fusion Lumbar Four- Five   BACK SURGERY     CARDIAC CATHETERIZATION     CARDIOVERSION N/A 08/29/2018   Procedure: CARDIOVERSION;   Surgeon: Lamar Blinks, MD;  Location: [facility] ORS;  Service: Cardiovascular;  Laterality: N/A;   CARDIOVERSION N/A 09/24/2018   Procedure: CARDIOVERSION;  Surgeon: Lamar Blinks, MD;  Location: [facility] ORS;  Service: Cardiovascular;  Laterality: N/A;   COLONOSCOPY WITH PROPOFOL N/A 10/05/2015   Procedure: COLONOSCOPY WITH PROPOFOL;  Surgeon: Christena Deem, MD;  Location: [facility] ENDOSCOPY;  Service: Endoscopy;  Laterality: N/A;   COLONOSCOPY WITH PROPOFOL N/A 11/01/2020   Procedure: COLONOSCOPY WITH PROPOFOL;  Surgeon: Wyline Mood, MD;  Location: [facility] ENDOSCOPY;  Service: Gastroenterology;  Laterality: N/A;   CORONARY STENT INTERVENTION N/A 12/22/2020   Procedure: CORONARY STENT INTERVENTION;  Surgeon: Marcina Millard, MD;  Location: [facility] INVASIVE CV LAB;  Service: Cardiovascular;  Laterality: N/A;   ESOPHAGOGASTRODUODENOSCOPY N/A 11/01/2020   Procedure: ESOPHAGOGASTRODUODENOSCOPY (EGD);  Surgeon: Wyline Mood, MD;  Location: [facility] ENDOSCOPY;  Service: Gastroenterology;  Laterality: N/A;   ESOPHAGOGASTRODUODENOSCOPY (EGD) WITH PROPOFOL N/A 04/01/2018   Procedure: ESOPHAGOGASTRODUODENOSCOPY (EGD) WITH PROPOFOL;  Surgeon: Christena Deem, MD;  Location: [facility] ENDOSCOPY;  Service: Endoscopy;  Laterality: N/A;   ESOPHAGOGASTRODUODENOSCOPY (EGD) WITH PROPOFOL N/A 06/01/2022   Procedure: ESOPHAGOGASTRODUODENOSCOPY (EGD) WITH PROPOFOL;  Surgeon: Wyline Mood, MD;  Location: [facility] ENDOSCOPY;  Service: Gastroenterology;  Laterality: N/A;   EYE SURGERY     HERNIA REPAIR     JOINT REPLACEMENT Bilateral    hips  RT+  LEFT X2    LEFT HEART CATH AND CORONARY ANGIOGRAPHY N/A 12/22/2020   Procedure: LEFT HEART CATH AND CORONARY ANGIOGRAPHY;  Surgeon: Marcina Millard, MD;  Location: [facility] INVASIVE CV LAB;  Service: Cardiovascular;  Laterality: N/A;   LEFT HEART CATH AND CORONARY ANGIOGRAPHY Left 08/23/2021   Procedure: LEFT HEART CATH AND CORONARY ANGIOGRAPHY;  Surgeon: Lamar Blinks, MD;   Location: [facility] INVASIVE CV LAB;  Service: Cardiovascular;  Laterality: Left;   LUMBAR LAMINECTOMY/DECOMPRESSION MICRODISCECTOMY Left 09/13/2016   Procedure: Microdiscectomy - Lumbar two-three,  Lumbar three- - left;  Surgeon: Donalee Citrin, MD;  Location: [facility] OR;  Service: Neurosurgery;  Laterality: Left;   SPINAL CORD STIMULATOR INSERTION  07/08/2019   TONSILLECTOMY     Patient Active Problem List   Diagnosis Date Noted   Finger numbness 10/13/2022   Subareolar mass of left breast 10/03/2022   COPD (chronic obstructive pulmonary disease) (HCC) 10/31/2021   Shortness of breath on exertion 08/04/2021   Diabetes mellitus with proteinuria (HCC) 04/04/2021   GERD without esophagitis 04/02/2021   Coronary artery disease 12/28/2020   History of non-ST elevation myocardial infarction (NSTEMI) 12/21/2020   Pain in right shin 10/25/2020   Peripheral vascular disease (HCC) 08/06/2020   Chronic pain syndrome 07/06/2020   Cervical facet joint syndrome 04/08/2020   Spinal cord stimulator status 12/11/2019   Elevated TSH 08/20/2019   CKD (chronic kidney disease) stage 3, GFR 30-59 ml/min (HCC) 01/19/2019   Acquired thrombophilia (HCC) 01/19/2019   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   Postlaminectomy syndrome, lumbar region 01/16/2019   History of fusion of lumbar spine (L2-L5) 01/16/2019   Chronic radicular lumbar pain 01/16/2019   HNP (herniated nucleus pulposus), lumbar 04/29/2018   Advanced care planning/counseling discussion 09/28/2016   Spinal stenosis, lumbar region, 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/13/2016   Hyperlipidemia associated with type 2 diabetes mellitus (HCC) 07/14/2015   Symptomatic anemia 06/30/2015   Benign prostatic hyperplasia without lower urinary tract symptoms 06/02/2015   OSA (obstructive sleep apnea) 03/23/2015   Hypertension associated with diabetes (HCC) 09/28/2014   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HCC) 09/28/2014   H/O prior ablation treatment  10/19/2011   AF (paroxysmal atrial fibrillation) (HCC) 10/19/2011   PCP: Marjie Skiff, NP  REFERRING PROVIDER: Lonell Face, MD   REFERRING DIAG: R26.89 (ICD-10-CM) - Balance problem   RATIONALE FOR EVALUATION AND TREATMENT: Rehabilitation  THERAPY DIAG: Muscle weakness (generalized)  Difficulty in walking, not elsewhere classified  ONSET DATE: 2021  FOLLOW-UP APPT SCHEDULED WITH REFERRING PROVIDER: Yes    SUBJECTIVE:  SUBJECTIVE STATEMENT:  Leg pain, Difficulty with Walking, Imbalance   PERTINENT HISTORY:   Per Chart Review:   10/17/2022 "Patient reported he is not doing well lately. He has been seeing Dr. Myer Haff. He reports his legs are so weak that he can hardly walk, so he has been using a crutch for the last 1-2 years. Reported numbness and loss of sensation in his toes. He also reported that recently his left hand has began to has some numbness to where it feels like it is asleep. Denies pain, loss of sensation, coldness, burning. 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 "  Today:  Patient reports to physical therapy today with a chief concern of muscle weakness and imbalance. Patient reports progressive worsening in balance and muscular strength. He states that he has loss of sensation in his toes he reports they are "asleep". Patient reports pain in the anterior thigh and bilateral calves. He currently  uses an loftstand crutch in his RUE. Lately patient has reported difficulty with dressing LE, performing fine motor movements with left hand. He denies falls, saddle parasthesia, nausea, vomitting, night sweats.       Pain: Yes Numbness/Tingling: Yes Focal Weakness: Yes Recent changes in overall health/medication: No Prior history of physical therapy for balance:  Yes Dominant hand: right Imaging: Yes  Red flags: Negative for bowel/bladder changes, saddle paresthesia, personal history of cancer, h/o spinal tumors, h/o compression fx, h/o abdominal aneurysm, abdominal pain, chills/fever, night sweats, nausea, vomiting, unrelenting pain, first onset of insidious LBP <22 y/o  PRECAUTIONS: Fall  WEIGHT BEARING RESTRICTIONS: No  FALLS: Has patient fallen in last 6 months? 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  Recent memory is intact.  Remote memory is intact.  Attention span and concentration are intact.  Expressive speech is intact.  Patient's fund of knowledge is within normal limits for educational level.  Gross Musculoskeletal Assessment Tremor: None Bulk: Normal Tone: Normal  Posture: Rounded forward posture in seated. Standing posture: Forward and rounded in standing, limited  cervicothoracic extension.   AROM Deferred  LE MMT: MMT (out of 5) Right  Left   Hip flexion 4 4-  Hip extension    Hip abduction    Hip adduction    Hip internal rotation 5 4-  Hip external rotation 5 4*  Knee flexion    Knee extension 4+ 4+  Ankle dorsiflexion 4- 4-  Ankle plantarflexion 5 5  Ankle inversion    Ankle eversion    (* = pain; Blank rows =  not tested)  Sensation Grossly intact to light touch throughout bilateral LEs as determined by testing dermatomes L2-S2. Proprioception, stereognosis, and hot/cold testing deferred on this date.  Reflexes R/L Knee Jerk (L3/4): 2+/2+  Ankle Jerk (S1/2): 2+/2+   Cranial Nerves Deferred  Coordination/Cerebellar Finger to Nose: WNL Heel to Shin: Deferred Rapid alternating movements: WNL Finger Opposition: WNL Pronator Drift: Negative  Bed mobility: Deferred   Transfers: Assistive device utilized: None  Sit to stand: Complete Independence Stand to sit: Complete Independence Chair to chair: SBA Slowed, required few attempts at stand before transfer, definite use of hands  Floor: Deferred  Curb:  Deferred  Stairs: Deferred  Gait: Gait pattern: step through pattern Distance walked: 82m Assistive device utilized: Crutches Level of assistance: Complete Independence Comments: Slowed, Cautious gait with use of loftstrand crutch in RUE.   Functional Outcome Measures  Results Comments  BERG 39/56 2,4,4,3,3,4,4,3,3,2,2,2,2,1  DGI    FGA    TUG    5TSTS 16.65s  With UE support, unable to perform without BUE support.   6 Minute Walk Test    10 Meter Gait Speed Self-selected: 18s = .55 m/s;  Loftstrand RUE  (Blank rows = not tested)   TODAY'S TREATMENT   Subjective: Patient arrives to physical therapy with additional fatigue today. Patient reported he performed more physical activity than a normal amount day prior. No further questions or concerns.   Pain: Denies resting pain  Ther-ex : NuStep Level  0-1 x 8 min for warm up and history interval, heat pack applied to left thigh region; Seated LAQ without resistance 1 x 15 ea leg;  Seated LAQ against manual resistance 1 x 15 ea leg;  Seated Marches 2 x 10 ea leg ;  Seated Ball Squeeze 2 x 15;  Seated Clamshell 3 x 15 (red theraband, 2 sets of green theraband);  Educated patient on Seated Hamstring Stretch and Calf Stretch with good return demonstration    Not Performed: Sit to Stand 1 x 8 (without UE support);  Sit to Stand with Airex 1 x 8 (without UE support);  Agility Ladder Forward Sideways and Retro stepping pattern x 2 trials 6" Step Taps with/without BUE Support 2 x 20 reps; 6" Step Taps on Airexa without BUE Support 1 x 20 reps; 12" Step without BUE Support 1 x 20 reps;  Forward Stepping Over 12" hurdles in // bars x multiple trials; Long Distance walking without AD to improve endurance and step length (~141ft); Obstacle Clearance over x2 12" hurdle and "6 hurdle x 3 trials;  Side Stepping with Yellow Theraband in agility ladder 2 x 3 trials;  - Added bending and reaching task with second set Retro Stepping in agility ladder x multiple trials;  Forward, Retro, Side Stepping onto airex pad, PT called directional patterns x 1 min;   PATIENT EDUCATION:  Education details: HEP and Plan of Care Person educated: Patient  Education method: Explanation and Handouts Education comprehension: verbalized understanding   HOME EXERCISE PROGRAM:  Access Code: [email or web address] URL: [email or web address] Date: 11/02/2022 Prepared by: Ria Comment  Exercises - Seated Long Arc Quad  - 1 x daily - 7 x weekly - 1-3 sets - 10 reps - Heel Raises with Counter Support  - 1 x daily - 7 x weekly - 1-3 sets - 10 reps - Semi-Tandem Balance at [employer] Eyes Open  - 1 x daily - 7 x weekly - 2-3 sets - 10 reps - 10-30 hold   ASSESSMENT:  CLINICAL IMPRESSION:  Today's session with a main focus on increasing LE strength in order to  improve functional mobility and balance. Session limited in standing exercise due to LE fatigue and pain throughout lower extremities. Pt tolerated seated resistive exercises in today's session. Pt continues to present with impaired endurance, activity tolerance and strength. PT encouraged pt to continue HEP and stretches. Pt will continue benefit from PT services to address deficits in strength, balance, and mobility in order to return to full function at home.  OBJECTIVE IMPAIRMENTS: decreased activity tolerance, decreased balance, decreased endurance, decreased mobility, difficulty walking, decreased strength, and pain.   ACTIVITY LIMITATIONS: carrying, lifting, bending, standing, squatting, stairs, transfers, and dressing  PARTICIPATION LIMITATIONS: cleaning, shopping, community activity, and yard work  PERSONAL FACTORS: Age, Fitness, Past/current experiences, Time since onset of injury/illness/exacerbation, and 3+ comorbidities: Peripheral neuropathy, SOB, COPD  are also affecting patient's functional outcome.   REHAB POTENTIAL: Good  CLINICAL DECISION MAKING: Evolving/moderate complexity  EVALUATION COMPLEXITY: Moderate   GOALS: Goals reviewed with patient? No  SHORT TERM GOALS: Target date: 12/29/2022   Pt will be independent with HEP in order to improve strength and balance in order to decrease fall risk and improve function at home. Baseline:  Goal status: INITIAL   LONG TERM GOALS: Target date: 01/26/2023  Pt will increase FOTO to at least 48 to demonstrate significant improvement in function at home related to balance  Baseline: 11/02/2022: 40 Goal status: INITIAL  2.  Pt will improve BERG by at least 3 points in order to demonstrate clinically significant improvement in balance.   Baseline: 11/02/2022: 39/56 Goal status: INITIAL  3.  Pt will improve ABC by at least 13% in order to demonstrate clinically significant improvement in balance confidence.      Baseline:  11/02/2022: 17.5% Goal status: INITIAL  4. Pt will decrease 5TSTS by at least 3 seconds in order to demonstrate clinically significant improvement in LE strength      Baseline: 11/02/2022: 16.65s Goal status: INITIAL  PLAN: PT FREQUENCY: 2x/week  PT DURATION: 8 weeks  PLANNED INTERVENTIONS: Therapeutic exercises, Therapeutic activity, Neuromuscular re-education, Balance training, Gait training, Patient/Family education, Self Care, Joint mobilization, Joint manipulation, Vestibular training, Canalith repositioning, Orthotic/Fit training, DME instructions, Dry Needling, Electrical stimulation, Spinal manipulation, Spinal mobilization, Cryotherapy, Moist heat, Taping, Traction, Ultrasound, Ionotophoresis 4mg /ml Dexamethasone, Manual therapy, and Re-evaluation.  PLAN FOR NEXT SESSION: review and modify strengthening and balance program  Lynnea Maizes PT, DPT, GCS  Alondra Vandeven SPT 12/01/2022, 8:57 AM

## 2022-12-05 ENCOUNTER — Ambulatory Visit: Payer: Medicare Other

## 2022-12-05 ENCOUNTER — Telehealth: Payer: Self-pay | Admitting: Cardiovascular Disease

## 2022-12-05 DIAGNOSIS — R262 Difficulty in walking, not elsewhere classified: Secondary | ICD-10-CM | POA: Diagnosis not present

## 2022-12-05 DIAGNOSIS — M6281 Muscle weakness (generalized): Secondary | ICD-10-CM

## 2022-12-05 NOTE — Telephone Encounter (Signed)
Patient is calling for results to zio monitor.

## 2022-12-05 NOTE — Telephone Encounter (Signed)
Patient has been made aware that once it has been reviewed by Dr. Kirke Corin, then we will call him back.

## 2022-12-05 NOTE — Therapy (Addendum)
OUTPATIENT PHYSICAL THERAPY BALANCE TREATMENT  Patient Name: Sean Reyes. MRN: [id number] DOB:10/18/1940, 82 y.o., male Today's Date: 12/05/2022  END OF SESSION:  PT End of Session - 12/05/22 1134     Visit Number 8    Number of Visits 17    Date for PT Re-Evaluation 12/28/22    Authorization Type eval: 11/02/2022    PT Start Time 1135    PT Stop Time 1213    PT Time Calculation (min) 38 min    Equipment Utilized During Treatment Gait belt    Activity Tolerance Patient tolerated treatment well    Behavior During Therapy WFL for tasks assessed/performed            Past Medical History:  Diagnosis Date   Anemia    Anxiety    Arthritis    Arthritis of neck    Atrial fibrillation (HCC)    Cataracts, bilateral    Complication of anesthesia    pt reports low BP's after surgery at [facility] and difficulty awakening   Depression    Diabetes (HCC)    dx 6-8 yrs ago   Dysrhythmia    a-fib   GERD (gastroesophageal reflux disease)    OCC TAKES ALKA SELTZER   History of kidney stones    10-15 yrs ago   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   Hyperlipidemia    Hypertension    Myocardial infarction (HCC) 12/2020   Nocturia    S/P ablation of atrial fibrillation    Ablative therapy   Sleep apnea    CPAP    Spinal stenosis    Tachycardia, unspecified    Past Surgical History:  Procedure Laterality Date   ABLATION     ANTERIOR LAT LUMBAR FUSION N/A 06/27/2017   Procedure: Anterior Lateral Lumbar Interbody  Fusion - Lumbar Two-Lumbar Three - Lumbar Three-Lumbar Four, Posterior Lumbar Interbody Fusion Lumbar Four- Five;  Surgeon: Donalee Citrin, MD;  Location: [facility] OR;  Service: Neurosurgery;  Laterality: N/A;  Anterior Lateral Lumbar Interbody  Fusion - Lumbar Two-Lumbar Three - Lumbar Three-Lumbar Four, Posterior Lumbar Interbody Fusion Lumbar Four- Five   BACK SURGERY     CARDIAC CATHETERIZATION     CARDIOVERSION N/A 08/29/2018   Procedure: CARDIOVERSION;   Surgeon: Lamar Blinks, MD;  Location: [facility] ORS;  Service: Cardiovascular;  Laterality: N/A;   CARDIOVERSION N/A 09/24/2018   Procedure: CARDIOVERSION;  Surgeon: Lamar Blinks, MD;  Location: [facility] ORS;  Service: Cardiovascular;  Laterality: N/A;   COLONOSCOPY WITH PROPOFOL N/A 10/05/2015   Procedure: COLONOSCOPY WITH PROPOFOL;  Surgeon: Christena Deem, MD;  Location: [facility] ENDOSCOPY;  Service: Endoscopy;  Laterality: N/A;   COLONOSCOPY WITH PROPOFOL N/A 11/01/2020   Procedure: COLONOSCOPY WITH PROPOFOL;  Surgeon: Wyline Mood, MD;  Location: [facility] ENDOSCOPY;  Service: Gastroenterology;  Laterality: N/A;   CORONARY STENT INTERVENTION N/A 12/22/2020   Procedure: CORONARY STENT INTERVENTION;  Surgeon: Marcina Millard, MD;  Location: [facility] INVASIVE CV LAB;  Service: Cardiovascular;  Laterality: N/A;   ESOPHAGOGASTRODUODENOSCOPY N/A 11/01/2020   Procedure: ESOPHAGOGASTRODUODENOSCOPY (EGD);  Surgeon: Wyline Mood, MD;  Location: [facility] ENDOSCOPY;  Service: Gastroenterology;  Laterality: N/A;   ESOPHAGOGASTRODUODENOSCOPY (EGD) WITH PROPOFOL N/A 04/01/2018   Procedure: ESOPHAGOGASTRODUODENOSCOPY (EGD) WITH PROPOFOL;  Surgeon: Christena Deem, MD;  Location: [facility] ENDOSCOPY;  Service: Endoscopy;  Laterality: N/A;   ESOPHAGOGASTRODUODENOSCOPY (EGD) WITH PROPOFOL N/A 06/01/2022   Procedure: ESOPHAGOGASTRODUODENOSCOPY (EGD) WITH PROPOFOL;  Surgeon: Wyline Mood, MD;  Location: [facility] ENDOSCOPY;  Service: Gastroenterology;  Laterality: N/A;   EYE SURGERY     HERNIA REPAIR     JOINT REPLACEMENT Bilateral    hips  RT+  LEFT X2    LEFT HEART CATH AND CORONARY ANGIOGRAPHY N/A 12/22/2020   Procedure: LEFT HEART CATH AND CORONARY ANGIOGRAPHY;  Surgeon: Marcina Millard, MD;  Location: [facility] INVASIVE CV LAB;  Service: Cardiovascular;  Laterality: N/A;   LEFT HEART CATH AND CORONARY ANGIOGRAPHY Left 08/23/2021   Procedure: LEFT HEART CATH AND CORONARY ANGIOGRAPHY;  Surgeon: Lamar Blinks, MD;   Location: [facility] INVASIVE CV LAB;  Service: Cardiovascular;  Laterality: Left;   LUMBAR LAMINECTOMY/DECOMPRESSION MICRODISCECTOMY Left 09/13/2016   Procedure: Microdiscectomy - Lumbar two-three,  Lumbar three- - left;  Surgeon: Donalee Citrin, MD;  Location: [facility] OR;  Service: Neurosurgery;  Laterality: Left;   SPINAL CORD STIMULATOR INSERTION  07/08/2019   TONSILLECTOMY     Patient Active Problem List   Diagnosis Date Noted   Finger numbness 10/13/2022   Subareolar mass of left breast 10/03/2022   COPD (chronic obstructive pulmonary disease) (HCC) 10/31/2021   Shortness of breath on exertion 08/04/2021   Diabetes mellitus with proteinuria (HCC) 04/04/2021   GERD without esophagitis 04/02/2021   Coronary artery disease 12/28/2020   History of non-ST elevation myocardial infarction (NSTEMI) 12/21/2020   Pain in right shin 10/25/2020   Peripheral vascular disease (HCC) 08/06/2020   Chronic pain syndrome 07/06/2020   Cervical facet joint syndrome 04/08/2020   Spinal cord stimulator status 12/11/2019   Elevated TSH 08/20/2019   CKD (chronic kidney disease) stage 3, GFR 30-59 ml/min (HCC) 01/19/2019   Acquired thrombophilia (HCC) 01/19/2019   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   Postlaminectomy syndrome, lumbar region 01/16/2019   History of fusion of lumbar spine (L2-L5) 01/16/2019   Chronic radicular lumbar pain 01/16/2019   HNP (herniated nucleus pulposus), lumbar 04/29/2018   Advanced care planning/counseling discussion 09/28/2016   Spinal stenosis, lumbar region, 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/13/2016   Hyperlipidemia associated with type 2 diabetes mellitus (HCC) 07/14/2015   Symptomatic anemia 06/30/2015   Benign prostatic hyperplasia without lower urinary tract symptoms 06/02/2015   OSA (obstructive sleep apnea) 03/23/2015   Hypertension associated with diabetes (HCC) 09/28/2014   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HCC) 09/28/2014   H/O prior ablation treatment  10/19/2011   AF (paroxysmal atrial fibrillation) (HCC) 10/19/2011   PCP: Marjie Skiff, NP  REFERRING PROVIDER: Lonell Face, MD   REFERRING DIAG: R26.89 (ICD-10-CM) - Balance problem   RATIONALE FOR EVALUATION AND TREATMENT: Rehabilitation  THERAPY DIAG: Muscle weakness (generalized)  Difficulty in walking, not elsewhere classified  ONSET DATE: 2021  FOLLOW-UP APPT SCHEDULED WITH REFERRING PROVIDER: Yes    SUBJECTIVE:  SUBJECTIVE STATEMENT:  Leg pain, Difficulty with Walking, Imbalance   PERTINENT HISTORY:   Per Chart Review:   10/17/2022 "Patient reported he is not doing well lately. He has been seeing Dr. Myer Haff. He reports his legs are so weak that he can hardly walk, so he has been using a crutch for the last 1-2 years. Reported numbness and loss of sensation in his toes. He also reported that recently his left hand has began to has some numbness to where it feels like it is asleep. Denies pain, loss of sensation, coldness, burning. 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 "  Today:  Patient reports to physical therapy today with a chief concern of muscle weakness and imbalance. Patient reports progressive worsening in balance and muscular strength. He states that he has loss of sensation in his toes he reports they are "asleep". Patient reports pain in the anterior thigh and bilateral calves. He currently  uses an loftstand crutch in his RUE. Lately patient has reported difficulty with dressing LE, performing fine motor movements with left hand. He denies falls, saddle parasthesia, nausea, vomitting, night sweats.       Pain: Yes Numbness/Tingling: Yes Focal Weakness: Yes Recent changes in overall health/medication: No Prior history of physical therapy for balance:  Yes Dominant hand: right Imaging: Yes  Red flags: Negative for bowel/bladder changes, saddle paresthesia, personal history of cancer, h/o spinal tumors, h/o compression fx, h/o abdominal aneurysm, abdominal pain, chills/fever, night sweats, nausea, vomiting, unrelenting pain, first onset of insidious LBP <22 y/o  PRECAUTIONS: Fall  WEIGHT BEARING RESTRICTIONS: No  FALLS: Has patient fallen in last 6 months? 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  Recent memory is intact.  Remote memory is intact.  Attention span and concentration are intact.  Expressive speech is intact.  Patient's fund of knowledge is within normal limits for educational level.  Gross Musculoskeletal Assessment Tremor: None Bulk: Normal Tone: Normal  Posture: Rounded forward posture in seated. Standing posture: Forward and rounded in standing, limited  cervicothoracic extension.   AROM Deferred  LE MMT: MMT (out of 5) Right  Left   Hip flexion 4 4-  Hip extension    Hip abduction    Hip adduction    Hip internal rotation 5 4-  Hip external rotation 5 4*  Knee flexion    Knee extension 4+ 4+  Ankle dorsiflexion 4- 4-  Ankle plantarflexion 5 5  Ankle inversion    Ankle eversion    (* = pain; Blank rows =  not tested)  Sensation Grossly intact to light touch throughout bilateral LEs as determined by testing dermatomes L2-S2. Proprioception, stereognosis, and hot/cold testing deferred on this date.  Reflexes R/L Knee Jerk (L3/4): 2+/2+  Ankle Jerk (S1/2): 2+/2+   Cranial Nerves Deferred  Coordination/Cerebellar Finger to Nose: WNL Heel to Shin: Deferred Rapid alternating movements: WNL Finger Opposition: WNL Pronator Drift: Negative  Bed mobility: Deferred   Transfers: Assistive device utilized: None  Sit to stand: Complete Independence Stand to sit: Complete Independence Chair to chair: SBA Slowed, required few attempts at stand before transfer, definite use of hands  Floor: Deferred  Curb:  Deferred  Stairs: Deferred  Gait: Gait pattern: step through pattern Distance walked: 72m Assistive device utilized: Crutches Level of assistance: Complete Independence Comments: Slowed, Cautious gait with use of loftstrand crutch in RUE.   Functional Outcome Measures  Results Comments  BERG 39/56 2,4,4,3,3,4,4,3,3,2,2,2,2,1  DGI    FGA    TUG    5TSTS 16.65s  With UE support, unable to perform without BUE support.   6 Minute Walk Test    10 Meter Gait Speed Self-selected: 18s = .55 m/s;  Loftstrand RUE  (Blank rows = not tested)   TODAY'S TREATMENT    Subjective: Patient arrives to physical therapy with additional fatigue today. Patient reported he performed more physical activity than normal yesterday. No further questions or concerns.    Pain: Denies resting pain   Neuromuscular  Re-Education: NuStep Level 0-1 x 8 min for warm up and history interval, heat pack applied to left thigh region;  Forward Step Up with No UE support 2 x 12 BLE;  Standing Marches, 4# Ankle Weight in // bars 1 x 12 BLE; Forward [place] with 4# AW in // bars x 2 trials;  Retro stepping with long step length in // bars x 3 trials;  Forward gait with Theratube x 3 Trials;  Retro Gait with Theratube x 3 Trials; Forward Gait with Cone Tapping in // bars x 2 trials;   Not Performed: Sit to Stand 1 x 8 (without UE support);  Sit to Stand with Airex 1 x 8 (without UE support);  Agility Ladder Forward Sideways and Retro stepping pattern x 2 trials 6" Step Taps with/without BUE Support 2 x 20 reps; 6" Step Taps on Airexa without BUE Support 1 x 20 reps; 12" Step without BUE Support 1 x 20 reps;  Forward Stepping Over 12" hurdles in // bars x multiple trials; Long Distance walking without AD to improve endurance and step length (~119ft); Obstacle Clearance over x2 12" hurdle and "6 hurdle x 3 trials;  Side Stepping with Yellow Theraband in agility ladder 2 x 3 trials;  - Added bending and reaching task with second set Retro Stepping in agility ladder x multiple trials;  Forward, Retro, Side Stepping onto airex pad, PT called directional patterns x 1 min;    PATIENT EDUCATION:  Education details: HEP and Plan of Care Person educated: Patient  Education method: Explanation and Handouts Education comprehension: verbalized understanding   HOME EXERCISE PROGRAM:  Access Code: [email or web address] URL: [email or web address] Date: 11/02/2022 Prepared by: Ria Comment  Exercises - Seated Long Arc Quad  - 1 x daily - 7 x weekly - 1-3 sets - 10 reps - Heel Raises with Counter Support  - 1 x daily - 7 x weekly - 1-3 sets - 10 reps - Semi-Tandem Balance at [employer] Eyes Open  - 1 x daily - 7 x weekly - 2-3  sets - 10 reps - 10-30 hold   ASSESSMENT:  CLINICAL IMPRESSION: Today's session  with a main focus on increasing LE strength in order to improve functional mobility and balance. Patient demonstrated ability to perform resisted gait in retro and forward directions without major LOB. Pt with improved step length following verbal cues from PT. Pt demonstrating improvements with dynamic balance; pt able to tolerate longer single leg stance without UE support. Pt continues to present with impaired endurance, activity tolerance and strength. PT encouraged pt to continue HEP and stretches. Pt will continue benefit from PT services to address deficits in strength, balance, and mobility in order to return to full function at home.  OBJECTIVE IMPAIRMENTS: decreased activity tolerance, decreased balance, decreased endurance, decreased mobility, difficulty walking, decreased strength, and pain.   ACTIVITY LIMITATIONS: carrying, lifting, bending, standing, squatting, stairs, transfers, and dressing  PARTICIPATION LIMITATIONS: cleaning, shopping, community activity, and yard work  PERSONAL FACTORS: Age, Fitness, Past/current experiences, Time since onset of injury/illness/exacerbation, and 3+ comorbidities: Peripheral neuropathy, SOB, COPD  are also affecting patient's functional outcome.   REHAB POTENTIAL: Good  CLINICAL DECISION MAKING: Evolving/moderate complexity  EVALUATION COMPLEXITY: Moderate   GOALS: Goals reviewed with patient? No  SHORT TERM GOALS: Target date: 01/02/2023   Pt will be independent with HEP in order to improve strength and balance in order to decrease fall risk and improve function at home. Baseline:  Goal status: INITIAL   LONG TERM GOALS: Target date: 01/30/2023  Pt will increase FOTO to at least 48 to demonstrate significant improvement in function at home related to balance  Baseline: 11/02/2022: 40 Goal status: INITIAL  2.  Pt will improve BERG by at least 3 points in order to demonstrate clinically significant improvement in balance.   Baseline:  11/02/2022: 39/56 Goal status: INITIAL  3.  Pt will improve ABC by at least 13% in order to demonstrate clinically significant improvement in balance confidence.      Baseline: 11/02/2022: 17.5% Goal status: INITIAL  4. Pt will decrease 5TSTS by at least 3 seconds in order to demonstrate clinically significant improvement in LE strength      Baseline: 11/02/2022: 16.65s Goal status: INITIAL  PLAN: PT FREQUENCY: 2x/week  PT DURATION: 8 weeks  PLANNED INTERVENTIONS: Therapeutic exercises, Therapeutic activity, Neuromuscular re-education, Balance training, Gait training, Patient/Family education, Self Care, Joint mobilization, Joint manipulation, Vestibular training, Canalith repositioning, Orthotic/Fit training, DME instructions, Dry Needling, Electrical stimulation, Spinal manipulation, Spinal mobilization, Cryotherapy, Moist heat, Taping, Traction, Ultrasound, Ionotophoresis 4mg /ml Dexamethasone, Manual therapy, and Re-evaluation.  PLAN FOR NEXT SESSION: review and modify strengthening and balance program  Lynnea Maizes PT, DPT, GCS  Canton Yearby SPT 12/05/2022, 3:34 PM

## 2022-12-07 ENCOUNTER — Ambulatory Visit: Payer: Medicare Other

## 2022-12-07 DIAGNOSIS — M6281 Muscle weakness (generalized): Secondary | ICD-10-CM

## 2022-12-07 DIAGNOSIS — R262 Difficulty in walking, not elsewhere classified: Secondary | ICD-10-CM | POA: Diagnosis not present

## 2022-12-07 NOTE — Therapy (Addendum)
OUTPATIENT PHYSICAL THERAPY BALANCE TREATMENT  Patient Name: Sean Reyes. MRN: [id number] DOB:07/23/1940, 82 y.o., male Today's Date: 12/07/2022  END OF SESSION:  PT End of Session - 12/07/22 1137     Visit Number 9    Number of Visits 17    Date for PT Re-Evaluation 12/28/22    Authorization Type eval: 11/02/2022    PT Start Time 1140    PT Stop Time 1225    PT Time Calculation (min) 45 min    Equipment Utilized During Treatment Gait belt    Activity Tolerance Patient tolerated treatment well    Behavior During Therapy WFL for tasks assessed/performed            Past Medical History:  Diagnosis Date   Anemia    Anxiety    Arthritis    Arthritis of neck    Atrial fibrillation (HCC)    Cataracts, bilateral    Complication of anesthesia    pt reports low BP's after surgery at [facility] and difficulty awakening   Depression    Diabetes (HCC)    dx 6-8 yrs ago   Dysrhythmia    a-fib   GERD (gastroesophageal reflux disease)    OCC TAKES ALKA SELTZER   History of kidney stones    10-15 yrs ago   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   Hyperlipidemia    Hypertension    Myocardial infarction (HCC) 12/2020   Nocturia    S/P ablation of atrial fibrillation    Ablative therapy   Sleep apnea    CPAP    Spinal stenosis    Tachycardia, unspecified    Past Surgical History:  Procedure Laterality Date   ABLATION     ANTERIOR LAT LUMBAR FUSION N/A 06/27/2017   Procedure: Anterior Lateral Lumbar Interbody  Fusion - Lumbar Two-Lumbar Three - Lumbar Three-Lumbar Four, Posterior Lumbar Interbody Fusion Lumbar Four- Five;  Surgeon: Donalee Citrin, MD;  Location: [facility] OR;  Service: Neurosurgery;  Laterality: N/A;  Anterior Lateral Lumbar Interbody  Fusion - Lumbar Two-Lumbar Three - Lumbar Three-Lumbar Four, Posterior Lumbar Interbody Fusion Lumbar Four- Five   BACK SURGERY     CARDIAC CATHETERIZATION     CARDIOVERSION N/A 08/29/2018   Procedure: CARDIOVERSION;   Surgeon: Lamar Blinks, MD;  Location: [facility] ORS;  Service: Cardiovascular;  Laterality: N/A;   CARDIOVERSION N/A 09/24/2018   Procedure: CARDIOVERSION;  Surgeon: Lamar Blinks, MD;  Location: [facility] ORS;  Service: Cardiovascular;  Laterality: N/A;   COLONOSCOPY WITH PROPOFOL N/A 10/05/2015   Procedure: COLONOSCOPY WITH PROPOFOL;  Surgeon: Christena Deem, MD;  Location: [facility] ENDOSCOPY;  Service: Endoscopy;  Laterality: N/A;   COLONOSCOPY WITH PROPOFOL N/A 11/01/2020   Procedure: COLONOSCOPY WITH PROPOFOL;  Surgeon: Wyline Mood, MD;  Location: [facility] ENDOSCOPY;  Service: Gastroenterology;  Laterality: N/A;   CORONARY STENT INTERVENTION N/A 12/22/2020   Procedure: CORONARY STENT INTERVENTION;  Surgeon: Marcina Millard, MD;  Location: [facility] INVASIVE CV LAB;  Service: Cardiovascular;  Laterality: N/A;   ESOPHAGOGASTRODUODENOSCOPY N/A 11/01/2020   Procedure: ESOPHAGOGASTRODUODENOSCOPY (EGD);  Surgeon: Wyline Mood, MD;  Location: [facility] ENDOSCOPY;  Service: Gastroenterology;  Laterality: N/A;   ESOPHAGOGASTRODUODENOSCOPY (EGD) WITH PROPOFOL N/A 04/01/2018   Procedure: ESOPHAGOGASTRODUODENOSCOPY (EGD) WITH PROPOFOL;  Surgeon: Christena Deem, MD;  Location: [facility] ENDOSCOPY;  Service: Endoscopy;  Laterality: N/A;   ESOPHAGOGASTRODUODENOSCOPY (EGD) WITH PROPOFOL N/A 06/01/2022   Procedure: ESOPHAGOGASTRODUODENOSCOPY (EGD) WITH PROPOFOL;  Surgeon: Wyline Mood, MD;  Location: [facility] ENDOSCOPY;  Service: Gastroenterology;  Laterality: N/A;   EYE SURGERY     HERNIA REPAIR     JOINT REPLACEMENT Bilateral    hips  RT+  LEFT X2    LEFT HEART CATH AND CORONARY ANGIOGRAPHY N/A 12/22/2020   Procedure: LEFT HEART CATH AND CORONARY ANGIOGRAPHY;  Surgeon: Marcina Millard, MD;  Location: [facility] INVASIVE CV LAB;  Service: Cardiovascular;  Laterality: N/A;   LEFT HEART CATH AND CORONARY ANGIOGRAPHY Left 08/23/2021   Procedure: LEFT HEART CATH AND CORONARY ANGIOGRAPHY;  Surgeon: Lamar Blinks, MD;   Location: [facility] INVASIVE CV LAB;  Service: Cardiovascular;  Laterality: Left;   LUMBAR LAMINECTOMY/DECOMPRESSION MICRODISCECTOMY Left 09/13/2016   Procedure: Microdiscectomy - Lumbar two-three,  Lumbar three- - left;  Surgeon: Donalee Citrin, MD;  Location: [facility] OR;  Service: Neurosurgery;  Laterality: Left;   SPINAL CORD STIMULATOR INSERTION  07/08/2019   TONSILLECTOMY     Patient Active Problem List   Diagnosis Date Noted   Finger numbness 10/13/2022   Subareolar mass of left breast 10/03/2022   COPD (chronic obstructive pulmonary disease) (HCC) 10/31/2021   Shortness of breath on exertion 08/04/2021   Diabetes mellitus with proteinuria (HCC) 04/04/2021   GERD without esophagitis 04/02/2021   Coronary artery disease 12/28/2020   History of non-ST elevation myocardial infarction (NSTEMI) 12/21/2020   Pain in right shin 10/25/2020   Peripheral vascular disease (HCC) 08/06/2020   Chronic pain syndrome 07/06/2020   Cervical facet joint syndrome 04/08/2020   Spinal cord stimulator status 12/11/2019   Elevated TSH 08/20/2019   CKD (chronic kidney disease) stage 3, GFR 30-59 ml/min (HCC) 01/19/2019   Acquired thrombophilia (HCC) 01/19/2019   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   Postlaminectomy syndrome, lumbar region 01/16/2019   History of fusion of lumbar spine (L2-L5) 01/16/2019   Chronic radicular lumbar pain 01/16/2019   HNP (herniated nucleus pulposus), lumbar 04/29/2018   Advanced care planning/counseling discussion 09/28/2016   Spinal stenosis, lumbar region, 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/13/2016   Hyperlipidemia associated with type 2 diabetes mellitus (HCC) 07/14/2015   Symptomatic anemia 06/30/2015   Benign prostatic hyperplasia without lower urinary tract symptoms 06/02/2015   OSA (obstructive sleep apnea) 03/23/2015   Hypertension associated with diabetes (HCC) 09/28/2014   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HCC) 09/28/2014   H/O prior ablation treatment  10/19/2011   AF (paroxysmal atrial fibrillation) (HCC) 10/19/2011   PCP: Marjie Skiff, NP  REFERRING PROVIDER: Lonell Face, MD   REFERRING DIAG: R26.89 (ICD-10-CM) - Balance problem   RATIONALE FOR EVALUATION AND TREATMENT: Rehabilitation  THERAPY DIAG: Muscle weakness (generalized)  Difficulty in walking, not elsewhere classified  ONSET DATE: 2021  FOLLOW-UP APPT SCHEDULED WITH REFERRING PROVIDER: Yes    SUBJECTIVE:  SUBJECTIVE STATEMENT:  Leg pain, Difficulty with Walking, Imbalance   PERTINENT HISTORY:   Per Chart Review:   10/17/2022 "Patient reported he is not doing well lately. He has been seeing Dr. Myer Haff. He reports his legs are so weak that he can hardly walk, so he has been using a crutch for the last 1-2 years. Reported numbness and loss of sensation in his toes. He also reported that recently his left hand has began to has some numbness to where it feels like it is asleep. Denies pain, loss of sensation, coldness, burning. 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 "  Today:  Patient reports to physical therapy today with a chief concern of muscle weakness and imbalance. Patient reports progressive worsening in balance and muscular strength. He states that he has loss of sensation in his toes he reports they are "asleep". Patient reports pain in the anterior thigh and bilateral calves. He currently  uses an loftstand crutch in his RUE. Lately patient has reported difficulty with dressing LE, performing fine motor movements with left hand. He denies falls, saddle parasthesia, nausea, vomitting, night sweats.       Pain: Yes Numbness/Tingling: Yes Focal Weakness: Yes Recent changes in overall health/medication: No Prior history of physical therapy for balance:  Yes Dominant hand: right Imaging: Yes  Red flags: Negative for bowel/bladder changes, saddle paresthesia, personal history of cancer, h/o spinal tumors, h/o compression fx, h/o abdominal aneurysm, abdominal pain, chills/fever, night sweats, nausea, vomiting, unrelenting pain, first onset of insidious LBP <22 y/o  PRECAUTIONS: Fall  WEIGHT BEARING RESTRICTIONS: No  FALLS: Has patient fallen in last 6 months? 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  Recent memory is intact.  Remote memory is intact.  Attention span and concentration are intact.  Expressive speech is intact.  Patient's fund of knowledge is within normal limits for educational level.  Gross Musculoskeletal Assessment Tremor: None Bulk: Normal Tone: Normal  Posture: Rounded forward posture in seated. Standing posture: Forward and rounded in standing, limited  cervicothoracic extension.   AROM Deferred  LE MMT: MMT (out of 5) Right  Left   Hip flexion 4 4-  Hip extension    Hip abduction    Hip adduction    Hip internal rotation 5 4-  Hip external rotation 5 4*  Knee flexion    Knee extension 4+ 4+  Ankle dorsiflexion 4- 4-  Ankle plantarflexion 5 5  Ankle inversion    Ankle eversion    (* = pain; Blank rows =  not tested)  Sensation Grossly intact to light touch throughout bilateral LEs as determined by testing dermatomes L2-S2. Proprioception, stereognosis, and hot/cold testing deferred on this date.  Reflexes R/L Knee Jerk (L3/4): 2+/2+  Ankle Jerk (S1/2): 2+/2+   Cranial Nerves Deferred  Coordination/Cerebellar Finger to Nose: WNL Heel to Shin: Deferred Rapid alternating movements: WNL Finger Opposition: WNL Pronator Drift: Negative  Bed mobility: Deferred   Transfers: Assistive device utilized: None  Sit to stand: Complete Independence Stand to sit: Complete Independence Chair to chair: SBA Slowed, required few attempts at stand before transfer, definite use of hands  Floor: Deferred  Curb:  Deferred  Stairs: Deferred  Gait: Gait pattern: step through pattern Distance walked: 30m Assistive device utilized: Crutches Level of assistance: Complete Independence Comments: Slowed, Cautious gait with use of loftstrand crutch in RUE.   Functional Outcome Measures  Results Comments  BERG 39/56 2,4,4,3,3,4,4,3,3,2,2,2,2,1  DGI    FGA    TUG    5TSTS 16.65s  With UE support, unable to perform without BUE support.   6 Minute Walk Test    10 Meter Gait Speed Self-selected: 18s = .55 m/s;  Loftstrand RUE  (Blank rows = not tested)   TODAY'S TREATMENT    Subjective: Patient arrives to physical therapy with no new reports. No further questions or concerns.    Pain: 2/10 Right Anterior Tibia   Neuromuscular Re-Education: NuStep Level 1-2 x 8 min for warm up and history interval; Seated LAQ and Knee  Flexion against manual resistance 1 x 10 ea direction BLE;  Serial Sit to Stand 3 x 5, No UE support;  Forward Step Up/Step Down for power, No UE support 2 x 10; Multi task in // bars, stepping over 3 6" hurdles and Bending/Reaching Cones x 3;  Nautilus Resisted Gait Forward, Retro, Lateral Direction x 2 ea direction;  Farmers Nurse, mental health in order to improve endurance, 6# dumbbell in ea UE 2 x 48';  Manual Seated Gastrocnemius Stretch 30s/ bout x 3 bout BLE;   Not Performed: Sit to Stand 1 x 8 (without UE support);  Sit to Stand with Airex 1 x 8 (without UE support);  Agility Ladder Forward Sideways and Retro stepping pattern x 2 trials 6" Step Taps with/without BUE Support 2 x 20 reps; 6" Step Taps on Airexa without BUE Support 1 x 20 reps; 12" Step without BUE Support 1 x 20 reps;  Forward Stepping Over 12" hurdles in // bars x multiple trials; Long Distance walking without AD to improve endurance and step length (~194ft); Obstacle Clearance over x2 12" hurdle and "6 hurdle x 3 trials;  Side Stepping with Yellow Theraband in agility ladder 2 x 3 trials;  - Added bending and reaching task with second set Retro Stepping in agility ladder x multiple trials;  Forward, Retro, Side Stepping onto airex pad, PT called directional patterns x 1 min;    PATIENT EDUCATION:  Education details: HEP and Plan of Care Person educated: Patient  Education method: Explanation and Handouts Education comprehension: verbalized understanding   HOME EXERCISE PROGRAM:  Access Code: [email or web address] URL: [email or web address] Date: 11/02/2022 Prepared by: Ria Comment  Exercises - Seated Long Arc Quad  - 1 x daily - 7 x weekly - 1-3 sets - 10 reps - Heel Raises with Counter Support  - 1 x daily - 7 x weekly - 1-3 sets - 10 reps - Semi-Tandem Balance at [employer] Eyes Open  - 1 x daily - 7 x weekly - 2-3  sets - 10 reps - 10-30 hold   ASSESSMENT:  CLINICAL IMPRESSION: Today's session with a  main focus on increasing LE strength in order to improve functional mobility and balance. Patient has demonstrated improvements in LE strength; today the patient decreased 5TSTS time without use of UE. He required CGA with resisted gait or tasks requiring external perturbations. Pt continues to present with impaired endurance, balance and strength. PT encouraged pt to continue HEP and stretches. Pt will continue benefit from PT services to address deficits in strength, balance, and mobility in order to return to full function at home.  OBJECTIVE IMPAIRMENTS: decreased activity tolerance, decreased balance, decreased endurance, decreased mobility, difficulty walking, decreased strength, and pain.   ACTIVITY LIMITATIONS: carrying, lifting, bending, standing, squatting, stairs, transfers, and dressing  PARTICIPATION LIMITATIONS: cleaning, shopping, community activity, and yard work  PERSONAL FACTORS: Age, Fitness, Past/current experiences, Time since onset of injury/illness/exacerbation, and 3+ comorbidities: Peripheral neuropathy, SOB, COPD  are also affecting patient's functional outcome.   REHAB POTENTIAL: Good  CLINICAL DECISION MAKING: Evolving/moderate complexity  EVALUATION COMPLEXITY: Moderate   GOALS: Goals reviewed with patient? No  SHORT TERM GOALS: Target date: 01/04/2023   Pt will be independent with HEP in order to improve strength and balance in order to decrease fall risk and improve function at home. Baseline:  Goal status: INITIAL   LONG TERM GOALS: Target date: 02/01/2023  Pt will increase FOTO to at least 48 to demonstrate significant improvement in function at home related to balance  Baseline: 11/02/2022: 40 Goal status: INITIAL  2.  Pt will improve BERG by at least 3 points in order to demonstrate clinically significant improvement in balance.   Baseline: 11/02/2022: 39/56 Goal status: INITIAL  3.  Pt will improve ABC by at least 13% in order to demonstrate  clinically significant improvement in balance confidence.      Baseline: 11/02/2022: 17.5% Goal status: INITIAL  4. Pt will decrease 5TSTS by at least 3 seconds in order to demonstrate clinically significant improvement in LE strength      Baseline: 11/02/2022: 16.65s; 12/07/2022: 9.85s Goal status: INITIAL  PLAN: PT FREQUENCY: 2x/week  PT DURATION: 8 weeks  PLANNED INTERVENTIONS: Therapeutic exercises, Therapeutic activity, Neuromuscular re-education, Balance training, Gait training, Patient/Family education, Self Care, Joint mobilization, Joint manipulation, Vestibular training, Canalith repositioning, Orthotic/Fit training, DME instructions, Dry Needling, Electrical stimulation, Spinal manipulation, Spinal mobilization, Cryotherapy, Moist heat, Taping, Traction, Ultrasound, Ionotophoresis 4mg /ml Dexamethasone, Manual therapy, and Re-evaluation.  PLAN FOR NEXT SESSION: review and modify strengthening and balance program  Aquila Menzie SPT Sharalyn Ink Huprich PT, DPT, GCS  12/07/2022, 3:08 PM

## 2022-12-08 NOTE — Telephone Encounter (Signed)
Patient made aware of results and verbalized understanding.  Monitor showed short runs of supraventricular tachycardia but no evidence of atrial fibrillation.  He does have frequent PACs as well.  No serious arrhythmia overall.

## 2022-12-08 NOTE — Telephone Encounter (Signed)
For some reason, the monitor was not assigned for anyone to read.  I went ahead and retrieved the study and read it.  See results note.

## 2022-12-11 ENCOUNTER — Ambulatory Visit: Payer: Medicare Other

## 2022-12-11 DIAGNOSIS — M6281 Muscle weakness (generalized): Secondary | ICD-10-CM | POA: Diagnosis not present

## 2022-12-11 DIAGNOSIS — R262 Difficulty in walking, not elsewhere classified: Secondary | ICD-10-CM | POA: Diagnosis not present

## 2022-12-11 NOTE — Therapy (Cosign Needed Addendum)
OUTPATIENT PHYSICAL THERAPY BALANCE PROGRESS NOTE  Dates of reporting period  11/02/2022  to 12/11/2022   Patient Name: Sean Reyes. MRN: [id number] DOB:06-12-1940, 82 y.o., male Today's Date: 12/11/2022  END OF SESSION:  PT End of Session - 12/11/22 1317     Visit Number 10    Number of Visits 17    Date for PT Re-Evaluation 12/28/22    Authorization Type eval: 11/02/2022    PT Start Time 1315    PT Stop Time 1400    PT Time Calculation (min) 45 min    Equipment Utilized During Treatment Gait belt    Activity Tolerance Patient tolerated treatment well    Behavior During Therapy WFL for tasks assessed/performed            Past Medical History:  Diagnosis Date   Anemia    Anxiety    Arthritis    Arthritis of neck    Atrial fibrillation (HCC)    Cataracts, bilateral    Complication of anesthesia    pt reports low BP's after surgery at [facility] and difficulty awakening   Depression    Diabetes (HCC)    dx 6-8 yrs ago   Dysrhythmia    a-fib   GERD (gastroesophageal reflux disease)    OCC TAKES ALKA SELTZER   History of kidney stones    10-15 yrs ago   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   Hyperlipidemia    Hypertension    Myocardial infarction (HCC) 12/2020   Nocturia    S/P ablation of atrial fibrillation    Ablative therapy   Sleep apnea    CPAP    Spinal stenosis    Tachycardia, unspecified    Past Surgical History:  Procedure Laterality Date   ABLATION     ANTERIOR LAT LUMBAR FUSION N/A 06/27/2017   Procedure: Anterior Lateral Lumbar Interbody  Fusion - Lumbar Two-Lumbar Three - Lumbar Three-Lumbar Four, Posterior Lumbar Interbody Fusion Lumbar Four- Five;  Surgeon: Donalee Citrin, MD;  Location: [facility] OR;  Service: Neurosurgery;  Laterality: N/A;  Anterior Lateral Lumbar Interbody  Fusion - Lumbar Two-Lumbar Three - Lumbar Three-Lumbar Four, Posterior Lumbar Interbody Fusion Lumbar Four- Five   BACK SURGERY     CARDIAC CATHETERIZATION      CARDIOVERSION N/A 08/29/2018   Procedure: CARDIOVERSION;  Surgeon: Lamar Blinks, MD;  Location: [facility] ORS;  Service: Cardiovascular;  Laterality: N/A;   CARDIOVERSION N/A 09/24/2018   Procedure: CARDIOVERSION;  Surgeon: Lamar Blinks, MD;  Location: [facility] ORS;  Service: Cardiovascular;  Laterality: N/A;   COLONOSCOPY WITH PROPOFOL N/A 10/05/2015   Procedure: COLONOSCOPY WITH PROPOFOL;  Surgeon: Christena Deem, MD;  Location: [facility] ENDOSCOPY;  Service: Endoscopy;  Laterality: N/A;   COLONOSCOPY WITH PROPOFOL N/A 11/01/2020   Procedure: COLONOSCOPY WITH PROPOFOL;  Surgeon: Wyline Mood, MD;  Location: [facility] ENDOSCOPY;  Service: Gastroenterology;  Laterality: N/A;   CORONARY STENT INTERVENTION N/A 12/22/2020   Procedure: CORONARY STENT INTERVENTION;  Surgeon: Marcina Millard, MD;  Location: [facility] INVASIVE CV LAB;  Service: Cardiovascular;  Laterality: N/A;   ESOPHAGOGASTRODUODENOSCOPY N/A 11/01/2020   Procedure: ESOPHAGOGASTRODUODENOSCOPY (EGD);  Surgeon: Wyline Mood, MD;  Location: [facility] ENDOSCOPY;  Service: Gastroenterology;  Laterality: N/A;   ESOPHAGOGASTRODUODENOSCOPY (EGD) WITH PROPOFOL N/A 04/01/2018   Procedure: ESOPHAGOGASTRODUODENOSCOPY (EGD) WITH PROPOFOL;  Surgeon: Christena Deem, MD;  Location: [facility] ENDOSCOPY;  Service: Endoscopy;  Laterality: N/A;   ESOPHAGOGASTRODUODENOSCOPY (EGD) WITH PROPOFOL N/A 06/01/2022   Procedure: ESOPHAGOGASTRODUODENOSCOPY (EGD)  WITH PROPOFOL;  Surgeon: Wyline Mood, MD;  Location: [facility] ENDOSCOPY;  Service: Gastroenterology;  Laterality: N/A;   EYE SURGERY     HERNIA REPAIR     JOINT REPLACEMENT Bilateral    hips  RT+  LEFT X2    LEFT HEART CATH AND CORONARY ANGIOGRAPHY N/A 12/22/2020   Procedure: LEFT HEART CATH AND CORONARY ANGIOGRAPHY;  Surgeon: Marcina Millard, MD;  Location: [facility] INVASIVE CV LAB;  Service: Cardiovascular;  Laterality: N/A;   LEFT HEART CATH AND CORONARY ANGIOGRAPHY Left 08/23/2021   Procedure: LEFT HEART CATH AND  CORONARY ANGIOGRAPHY;  Surgeon: Lamar Blinks, MD;  Location: [facility] INVASIVE CV LAB;  Service: Cardiovascular;  Laterality: Left;   LUMBAR LAMINECTOMY/DECOMPRESSION MICRODISCECTOMY Left 09/13/2016   Procedure: Microdiscectomy - Lumbar two-three,  Lumbar three- - left;  Surgeon: Donalee Citrin, MD;  Location: [facility] OR;  Service: Neurosurgery;  Laterality: Left;   SPINAL CORD STIMULATOR INSERTION  07/08/2019   TONSILLECTOMY     Patient Active Problem List   Diagnosis Date Noted   Finger numbness 10/13/2022   Subareolar mass of left breast 10/03/2022   COPD (chronic obstructive pulmonary disease) (HCC) 10/31/2021   Shortness of breath on exertion 08/04/2021   Diabetes mellitus with proteinuria (HCC) 04/04/2021   GERD without esophagitis 04/02/2021   Coronary artery disease 12/28/2020   History of non-ST elevation myocardial infarction (NSTEMI) 12/21/2020   Pain in right shin 10/25/2020   Peripheral vascular disease (HCC) 08/06/2020   Chronic pain syndrome 07/06/2020   Cervical facet joint syndrome 04/08/2020   Spinal cord stimulator status 12/11/2019   Elevated TSH 08/20/2019   CKD (chronic kidney disease) stage 3, GFR 30-59 ml/min (HCC) 01/19/2019   Acquired thrombophilia (HCC) 01/19/2019   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   Postlaminectomy syndrome, lumbar region 01/16/2019   History of fusion of lumbar spine (L2-L5) 01/16/2019   Chronic radicular lumbar pain 01/16/2019   HNP (herniated nucleus pulposus), lumbar 04/29/2018   Advanced care planning/counseling discussion 09/28/2016   Spinal stenosis, lumbar region, 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/13/2016   Hyperlipidemia associated with type 2 diabetes mellitus (HCC) 07/14/2015   Symptomatic anemia 06/30/2015   Benign prostatic hyperplasia without lower urinary tract symptoms 06/02/2015   OSA (obstructive sleep apnea) 03/23/2015   Hypertension associated with diabetes (HCC) 09/28/2014   Diabetes mellitus with autonomic neuropathy  (HCC) 09/28/2014   H/O prior ablation treatment 10/19/2011   AF (paroxysmal atrial fibrillation) (HCC) 10/19/2011   PCP: Marjie Skiff, NP  REFERRING PROVIDER: Lonell Face, MD   REFERRING DIAG: R26.89 (ICD-10-CM) - Balance problem   RATIONALE FOR EVALUATION AND TREATMENT: Rehabilitation  THERAPY DIAG: Muscle weakness (generalized)  Difficulty in walking, not elsewhere classified  ONSET DATE: 2021  FOLLOW-UP APPT SCHEDULED WITH REFERRING PROVIDER: Yes    SUBJECTIVE:  SUBJECTIVE STATEMENT:  Leg pain, Difficulty with Walking, Imbalance   PERTINENT HISTORY:   Per Chart Review:   10/17/2022 "Patient reported he is not doing well lately. He has been seeing Dr. Myer Haff. He reports his legs are so weak that he can hardly walk, so he has been using a crutch for the last 1-2 years. Reported numbness and loss of sensation in his toes. He also reported that recently his left hand has began to has some numbness to where it feels like it is asleep. Denies pain, loss of sensation, coldness, burning. 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 "  Today:  Patient reports to physical therapy today with a chief concern of muscle weakness and imbalance. Patient reports progressive worsening in balance and muscular strength. He states that he has loss of sensation in his toes he reports they are "asleep". Patient reports pain in the  anterior thigh and bilateral calves. He currently uses an loftstand crutch in his RUE. Lately patient has reported difficulty with dressing LE, performing fine motor movements with left hand. He denies falls, saddle parasthesia, nausea, vomitting, night sweats.       Pain: Yes Numbness/Tingling: Yes Focal Weakness: Yes Recent changes in overall health/medication: No Prior history of physical therapy for balance:  Yes Dominant hand: right Imaging: Yes  Red flags: Negative for bowel/bladder changes, saddle paresthesia, personal history of cancer, h/o spinal tumors, h/o compression fx, h/o abdominal aneurysm, abdominal pain, chills/fever, night sweats, nausea, vomiting, unrelenting pain, first onset of insidious LBP <22 y/o  PRECAUTIONS: Fall  WEIGHT BEARING RESTRICTIONS: No  FALLS: Has patient fallen in last 6 months? 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  Recent memory is intact.  Remote memory is intact.  Attention span and concentration are intact.  Expressive speech is intact.  Patient's fund of knowledge is within normal limits for educational level.  Gross Musculoskeletal Assessment Tremor: None Bulk: Normal Tone: Normal  Posture: Rounded forward posture in seated. Standing  posture: Forward and rounded in standing, limited cervicothoracic extension.   AROM Deferred  LE MMT: MMT (out of 5) Right  Left   Hip flexion 4 4-  Hip extension    Hip abduction    Hip adduction    Hip internal rotation 5 4-  Hip external rotation 5 4*  Knee flexion    Knee extension 4+ 4+  Ankle dorsiflexion 4- 4-  Ankle plantarflexion 5 5  Ankle inversion    Ankle eversion    (* = pain; Blank rows =  not tested)  Sensation Grossly intact to light touch throughout bilateral LEs as determined by testing dermatomes L2-S2. Proprioception, stereognosis, and hot/cold testing deferred on this date.  Reflexes R/L Knee Jerk (L3/4): 2+/2+  Ankle Jerk (S1/2): 2+/2+   Cranial Nerves Deferred  Coordination/Cerebellar Finger to Nose: WNL Heel to Shin: Deferred Rapid alternating movements: WNL Finger Opposition: WNL Pronator Drift: Negative  Bed mobility: Deferred   Transfers: Assistive device utilized: None  Sit to stand: Complete Independence Stand to sit: Complete Independence Chair to chair: SBA Slowed, required few attempts at stand before transfer, definite use of hands  Floor: Deferred  Curb:  Deferred  Stairs: Deferred  Gait: Gait pattern: step through pattern Distance walked: 28m Assistive device utilized: Crutches Level of assistance: Complete Independence Comments: Slowed, Cautious gait with use of loftstrand crutch in RUE.   Functional Outcome Measures  Results Comments  BERG 39/56 2,4,4,3,3,4,4,3,3,2,2,2,2,1  DGI    FGA    TUG    5TSTS 16.65s  With UE support, unable to perform without BUE support.   6 Minute Walk Test    10 Meter Gait Speed Self-selected: 18s = .55 m/s;  Loftstrand RUE  (Blank rows = not tested)   TODAY'S TREATMENT    Subjective: Patient arrives to physical therapy with moderate fatigue due to increased activities in the AM. No further questions or concerns.   Pain: 4/10 Lumbar Region and R. Anterior  [place]  Neuromuscular Re-Education: NuStep Level 0-1 x 8 min for warm up and history interval, PT manually adjusted resistance;  Updated Functional Outcome measures, discussed and educated patient on results;  FOTO: 45 BERG: 48 5TSTS: 8.54s (No UE/AD);  ABC: 38.12  Not Performed: Sit to Stand 1 x 8 (without UE support);  Sit to Stand with Airex 1 x 8 (without UE support);  Agility Ladder Forward Sideways and Retro stepping pattern x 2 trials 6" Step Taps with/without BUE Support 2 x 20 reps; 6" Step Taps on Airexa without BUE Support 1 x 20 reps; 12" Step without BUE Support 1 x 20 reps;  Forward Stepping Over 12" hurdles in // bars x multiple trials; Long Distance walking without AD to improve endurance and step length (~139ft); Obstacle Clearance over x2 12" hurdle and "6 hurdle x 3 trials;  Side Stepping with Yellow Theraband in agility ladder 2 x 3 trials;  - Added bending and reaching task with second set Retro Stepping in agility ladder x multiple trials;  Forward, Retro, Side Stepping onto airex pad, PT called directional patterns x 1 min;    PATIENT EDUCATION:  Education details: HEP and Plan of Care Person educated: Patient  Education method: Explanation and Handouts Education comprehension: verbalized understanding   HOME EXERCISE PROGRAM:  Access Code: [email or web address] URL: [email or web address] Date: 11/02/2022 Prepared by: Ria Comment  Exercises - Seated Long Arc Quad  - 1 x daily - 7 x weekly - 1-3 sets - 10 reps - Heel Raises with Counter Support  - 1 x daily - 7 x weekly - 1-3 sets - 10 reps - Semi-Tandem Balance at [employer] Eyes Open  - 1 x daily - 7 x weekly - 2-3 sets - 10 reps - 10-30 hold   ASSESSMENT:  CLINICAL IMPRESSION: Patient arrived to physical therapy highly motivated to improve balance and LE strength. Today's session with a main focus on reassessing progress towards goals. He has demonstrated significant improvements in balance  confidence and overall balance as noted in today's outcome measures. Patient demonstrated ability to  perform short single leg stance, tandem stance, and step taps on the BERG independently. Additionally the patient has demonstrated significant improvements in LE strength; pt able to perform 5TSTS independently within < 12s without UE support.  He still presents with decreased dynamic balance and is limited 2/2 lumbar pain. No updates in today's session. PT plans to continue progressing dynamic balance. Based on today's session pt will continue benefit from PT services 1-2x/ week to address deficits in strength, balance, and mobility in order to return to full function at home.  OBJECTIVE IMPAIRMENTS: decreased activity tolerance, decreased balance, decreased endurance, decreased mobility, difficulty walking, decreased strength, and pain.   ACTIVITY LIMITATIONS: carrying, lifting, bending, standing, squatting, stairs, transfers, and dressing  PARTICIPATION LIMITATIONS: cleaning, shopping, community activity, and yard work  PERSONAL FACTORS: Age, Fitness, Past/current experiences, Time since onset of injury/illness/exacerbation, and 3+ comorbidities: Peripheral neuropathy, SOB, COPD  are also affecting patient's functional outcome.   REHAB POTENTIAL: Good  CLINICAL DECISION MAKING: Evolving/moderate complexity  EVALUATION COMPLEXITY: Moderate   GOALS: Goals reviewed with patient? No  SHORT TERM GOALS: Target date: 01/08/2023   Pt will be independent with HEP in order to improve strength and balance in order to decrease fall risk and improve function at home. Baseline:  Goal status: INITIAL   LONG TERM GOALS: Target date: 02/05/2023  Pt will increase FOTO to at least 48 to demonstrate significant improvement in function at home related to balance  Baseline: 11/02/2022: 40; 12/11/2022: 45 Goal status: INITIAL  2.  Pt will improve BERG by at least 3 points in order to demonstrate  clinically significant improvement in balance.   Baseline: 11/02/2022: 39/56; 12/11/2022: 48/56 Goal status: INITIAL  3.  Pt will improve ABC by at least 13% in order to demonstrate clinically significant improvement in balance confidence.      Baseline: 11/02/2022: 17.5%; 12/11/2022: 38.12 Goal status: INITIAL  4. Pt will decrease 5TSTS by at least 3 seconds in order to demonstrate clinically significant improvement in LE strength      Baseline: 11/02/2022: 16.65s; 12/07/2022: 9.85s; 12/11/2022: 8.54s Goal status: INITIAL  PLAN: PT FREQUENCY: 1-2x/week  PT DURATION: 8 weeks  PLANNED INTERVENTIONS: Therapeutic exercises, Therapeutic activity, Neuromuscular re-education, Balance training, Gait training, Patient/Family education, Self Care, Joint mobilization, Joint manipulation, Vestibular training, Canalith repositioning, Orthotic/Fit training, DME instructions, Dry Needling, Electrical stimulation, Spinal manipulation, Spinal mobilization, Cryotherapy, Moist heat, Taping, Traction, Ultrasound, Ionotophoresis 4mg /ml Dexamethasone, Manual therapy, and Re-evaluation.  PLAN FOR NEXT SESSION: review and modify strengthening and balance program  Kikue Gerhart SPT Sharalyn Ink Huprich PT, DPT, GCS  12/11/2022, 1:20 PM

## 2022-12-18 ENCOUNTER — Ambulatory Visit: Payer: Medicare Other

## 2022-12-21 ENCOUNTER — Ambulatory Visit: Payer: Medicare Other | Attending: Neurology

## 2022-12-21 DIAGNOSIS — M6281 Muscle weakness (generalized): Secondary | ICD-10-CM | POA: Insufficient documentation

## 2022-12-21 DIAGNOSIS — R262 Difficulty in walking, not elsewhere classified: Secondary | ICD-10-CM | POA: Insufficient documentation

## 2022-12-21 NOTE — Therapy (Signed)
OUTPATIENT PHYSICAL THERAPY TREATMENT    Patient Name: Sean Reyes. MRN: [id number] DOB:08-13-1940, 82 y.o., male Today's Date: 12/21/2022  END OF SESSION:  PT End of Session - 12/21/22 1450     Visit Number 11    Number of Visits 17    Date for PT Re-Evaluation 12/28/22    Authorization Type Medicare A&B; BCBS supplemental    Authorization Time Period eval: 11/02/2022    Progress Note Due on Visit 20    PT Start Time 1445    PT Stop Time 1525    PT Time Calculation (min) 40 min    Activity Tolerance Patient tolerated treatment well;Patient limited by pain    Behavior During Therapy WFL for tasks assessed/performed            Past Medical History:  Diagnosis Date   Anemia    Anxiety    Arthritis    Arthritis of neck    Atrial fibrillation (HCC)    Cataracts, bilateral    Complication of anesthesia    pt reports low BP's after surgery at [facility] and difficulty awakening   Depression    Diabetes (HCC)    dx 6-8 yrs ago   Dysrhythmia    a-fib   GERD (gastroesophageal reflux disease)    OCC TAKES ALKA SELTZER   History of kidney stones    10-15 yrs ago   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   Hyperlipidemia    Hypertension    Myocardial infarction (HCC) 12/2020   Nocturia    S/P ablation of atrial fibrillation    Ablative therapy   Sleep apnea    CPAP    Spinal stenosis    Tachycardia, unspecified    Past Surgical History:  Procedure Laterality Date   ABLATION     ANTERIOR LAT LUMBAR FUSION N/A 06/27/2017   Procedure: Anterior Lateral Lumbar Interbody  Fusion - Lumbar Two-Lumbar Three - Lumbar Three-Lumbar Four, Posterior Lumbar Interbody Fusion Lumbar Four- Five;  Surgeon: Donalee Citrin, MD;  Location: [facility] OR;  Service: Neurosurgery;  Laterality: N/A;  Anterior Lateral Lumbar Interbody  Fusion - Lumbar Two-Lumbar Three - Lumbar Three-Lumbar Four, Posterior Lumbar Interbody Fusion Lumbar Four- Five   BACK SURGERY     CARDIAC  CATHETERIZATION     CARDIOVERSION N/A 08/29/2018   Procedure: CARDIOVERSION;  Surgeon: Lamar Blinks, MD;  Location: [facility] ORS;  Service: Cardiovascular;  Laterality: N/A;   CARDIOVERSION N/A 09/24/2018   Procedure: CARDIOVERSION;  Surgeon: Lamar Blinks, MD;  Location: [facility] ORS;  Service: Cardiovascular;  Laterality: N/A;   COLONOSCOPY WITH PROPOFOL N/A 10/05/2015   Procedure: COLONOSCOPY WITH PROPOFOL;  Surgeon: Christena Deem, MD;  Location: [facility] ENDOSCOPY;  Service: Endoscopy;  Laterality: N/A;   COLONOSCOPY WITH PROPOFOL N/A 11/01/2020   Procedure: COLONOSCOPY WITH PROPOFOL;  Surgeon: Wyline Mood, MD;  Location: [facility] ENDOSCOPY;  Service: Gastroenterology;  Laterality: N/A;   CORONARY STENT INTERVENTION N/A 12/22/2020   Procedure: CORONARY STENT INTERVENTION;  Surgeon: Marcina Millard, MD;  Location: [facility] INVASIVE CV LAB;  Service: Cardiovascular;  Laterality: N/A;   ESOPHAGOGASTRODUODENOSCOPY N/A 11/01/2020   Procedure: ESOPHAGOGASTRODUODENOSCOPY (EGD);  Surgeon: Wyline Mood, MD;  Location: [facility] ENDOSCOPY;  Service: Gastroenterology;  Laterality: N/A;   ESOPHAGOGASTRODUODENOSCOPY (EGD) WITH PROPOFOL N/A 04/01/2018   Procedure: ESOPHAGOGASTRODUODENOSCOPY (EGD) WITH PROPOFOL;  Surgeon: Christena Deem, MD;  Location: [facility] ENDOSCOPY;  Service: Endoscopy;  Laterality: N/A;   ESOPHAGOGASTRODUODENOSCOPY (EGD) WITH PROPOFOL N/A 06/01/2022   Procedure:  ESOPHAGOGASTRODUODENOSCOPY (EGD) WITH PROPOFOL;  Surgeon: Wyline Mood, MD;  Location: [facility] ENDOSCOPY;  Service: Gastroenterology;  Laterality: N/A;   EYE SURGERY     HERNIA REPAIR     JOINT REPLACEMENT Bilateral    hips  RT+  LEFT X2    LEFT HEART CATH AND CORONARY ANGIOGRAPHY N/A 12/22/2020   Procedure: LEFT HEART CATH AND CORONARY ANGIOGRAPHY;  Surgeon: Marcina Millard, MD;  Location: [facility] INVASIVE CV LAB;  Service: Cardiovascular;  Laterality: N/A;   LEFT HEART CATH AND CORONARY ANGIOGRAPHY Left 08/23/2021   Procedure:  LEFT HEART CATH AND CORONARY ANGIOGRAPHY;  Surgeon: Lamar Blinks, MD;  Location: [facility] INVASIVE CV LAB;  Service: Cardiovascular;  Laterality: Left;   LUMBAR LAMINECTOMY/DECOMPRESSION MICRODISCECTOMY Left 09/13/2016   Procedure: Microdiscectomy - Lumbar two-three,  Lumbar three- - left;  Surgeon: Donalee Citrin, MD;  Location: [facility] OR;  Service: Neurosurgery;  Laterality: Left;   SPINAL CORD STIMULATOR INSERTION  07/08/2019   TONSILLECTOMY     Patient Active Problem List   Diagnosis Date Noted   Finger numbness 10/13/2022   Subareolar mass of left breast 10/03/2022   COPD (chronic obstructive pulmonary disease) (HCC) 10/31/2021   Shortness of breath on exertion 08/04/2021   Diabetes mellitus with proteinuria (HCC) 04/04/2021   GERD without esophagitis 04/02/2021   Coronary artery disease 12/28/2020   History of non-ST elevation myocardial infarction (NSTEMI) 12/21/2020   Pain in right shin 10/25/2020   Peripheral vascular disease (HCC) 08/06/2020   Chronic pain syndrome 07/06/2020   Cervical facet joint syndrome 04/08/2020   Spinal cord stimulator status 12/11/2019   Elevated TSH 08/20/2019   CKD (chronic kidney disease) stage 3, GFR 30-59 ml/min (HCC) 01/19/2019   Acquired thrombophilia (HCC) 01/19/2019   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   Postlaminectomy syndrome, lumbar region 01/16/2019   History of fusion of lumbar spine (L2-L5) 01/16/2019   Chronic radicular lumbar pain 01/16/2019   HNP (herniated nucleus pulposus), lumbar 04/29/2018   Advanced care planning/counseling discussion 09/28/2016   Spinal stenosis, lumbar region, 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/13/2016   Hyperlipidemia associated with type 2 diabetes mellitus (HCC) 07/14/2015   Symptomatic anemia 06/30/2015   Benign prostatic hyperplasia without lower urinary tract symptoms 06/02/2015   OSA (obstructive sleep apnea) 03/23/2015   Hypertension associated with diabetes (HCC) 09/28/2014   Diabetes mellitus with  autonomic neuropathy (HCC) 09/28/2014   H/O prior ablation treatment 10/19/2011   AF (paroxysmal atrial fibrillation) (HCC) 10/19/2011   PCP: Marjie Skiff, NP  REFERRING PROVIDER: Lonell Face, MD   REFERRING DIAG: R26.89 (ICD-10-CM) - Balance problem   RATIONALE FOR EVALUATION AND TREATMENT: Rehabilitation  THERAPY DIAG: Muscle weakness (generalized)  Difficulty in walking, not elsewhere classified  ONSET DATE: 2021  FOLLOW-UP APPT SCHEDULED WITH REFERRING PROVIDER: Yes    SUBJECTIVE:  SUBJECTIVE STATEMENT:   Pt reports he's having a rough day today, legs are feeling very weak. Pain is much worse in back too.   PERTINENT HISTORY:   10/17/2022 "Patient reported he is not doing well lately. He has been seeing Dr. Myer Haff. He reports his legs are so weak that he can hardly walk, so he has been using a crutch for the last 1-2 years. Reported numbness and loss of sensation in his toes. He also reported that recently his left hand has began to has some numbness to where it feels like it is asleep. Denies pain, loss of sensation, coldness, burning. 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 "  Today:  Patient reports to physical therapy today with a chief concern of muscle weakness and imbalance. Patient reports progressive worsening in balance and muscular strength. He states that he has loss of sensation in his toes he  reports they are "asleep". Patient reports pain in the anterior thigh and bilateral calves. He currently uses an loftstand crutch in his RUE. Lately patient has reported difficulty with dressing LE, performing fine motor movements with left hand. He denies falls, saddle parasthesia, nausea, vomitting, night sweats.       Pain: 6/10 walking   PRECAUTIONS: Fall  WEIGHT BEARING RESTRICTIONS: No  FALLS: Has patient fallen in last 6 months? 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  - STS from chair + airex pad hands free x8 (2 sets)   Sit break  -Side Stepping with YTB at knees, light and fast, 3 round trips in // bars -retro AMB in bars c YTB at knees, 4x total (2 hands support, cues for step-to gait)  Sit break  -Step Taps with/without BUE Support 2 x 20 reps (tapping 10lb ball as to trigger gentile tapping and avoid rollaway)  Sit break  -6" Step taps on airex without BUE Support 1 x 20 reps  Lateral step over half foam roll 1x20  -vital: EOS: 133/68 mmHg, 44 BPM HR  Asked pt to call cardiology about his exercise [place] today, SOB, bradycardia     PATIENT EDUCATION:  Education details: exercise modification to achieve best outcomes in session.  Person educated: Patient  Education  method: Explanation and Handouts Education comprehension: verbalized understanding   HOME EXERCISE PROGRAM:  Access Code: [email or web address] URL: [email or web address] Date: 11/02/2022 Prepared by: Ria Comment  Exercises - Seated Long Arc Quad  - 1 x daily - 7 x weekly - 1-3 sets - 10 reps - Heel Raises with Counter  Support  - 1 x daily - 7 x weekly - 1-3 sets - 10 reps - Semi-Tandem Balance at [employer] Eyes Open  - 1 x daily - 7 x weekly - 2-3 sets - 10 reps - 10-30 hold   ASSESSMENT:  CLINICAL IMPRESSION: Pt arrives in recent exacerbation despite recent gains seen in reassessment. A toned down session is taken in respect of pt's symptoms. Noted exercise intolerance today and bradycardia. Asked pt to call his cardiologist at EOS. Will continue to advance as able.    OBJECTIVE IMPAIRMENTS: decreased activity tolerance, decreased balance, decreased endurance, decreased mobility, difficulty walking, decreased strength, and pain.   ACTIVITY LIMITATIONS: carrying, lifting, bending, standing, squatting, stairs, transfers, and dressing  PARTICIPATION LIMITATIONS: cleaning, shopping, community activity, and yard work  PERSONAL FACTORS: Age, Fitness, Past/current experiences, Time since onset of injury/illness/exacerbation, and 3+ comorbidities: Peripheral neuropathy, SOB, COPD  are also affecting patient's functional outcome.   REHAB POTENTIAL: Good  CLINICAL DECISION MAKING: Evolving/moderate complexity  EVALUATION COMPLEXITY: Moderate   GOALS: Goals reviewed with patient? No  SHORT TERM GOALS: Target date: 01/18/2023   Pt will be independent with HEP in order to improve strength and balance in order to decrease fall risk and improve function at home. Baseline:  Goal status: INITIAL   LONG TERM GOALS: Target date: 02/15/2023  Pt will increase FOTO to at least 48 to demonstrate significant improvement in function at home related to balance  Baseline: 11/02/2022: 40;  12/11/2022: 45 Goal status: INITIAL  2.  Pt will improve BERG by at least 3 points in order to demonstrate clinically significant improvement in balance.   Baseline: 11/02/2022: 39/56; 12/11/2022: 48/56 Goal status: INITIAL  3.  Pt will improve ABC by at least 13% in order to demonstrate clinically significant improvement in balance confidence.      Baseline: 11/02/2022: 17.5%; 12/11/2022: 38.12 Goal status: INITIAL  4. Pt will decrease 5TSTS by at least 3 seconds in order to demonstrate clinically significant improvement in LE strength      Baseline: 11/02/2022: 16.65s; 12/07/2022: 9.85s; 12/11/2022: 8.54s Goal status: INITIAL  PLAN: PT FREQUENCY: 1-2x/week  PT DURATION: 8 weeks  PLANNED INTERVENTIONS: Therapeutic exercises, Therapeutic activity, Neuromuscular re-education, Balance training, Gait training, Patient/Family education, Self Care, Joint mobilization, Joint manipulation, Vestibular training, Canalith repositioning, Orthotic/Fit training, DME instructions, Dry Needling, Electrical stimulation, Spinal manipulation, Spinal mobilization, Cryotherapy, Moist heat, Taping, Traction, Ultrasound, Ionotophoresis 4mg /ml Dexamethasone, Manual therapy, and Re-evaluation.  PLAN FOR NEXT SESSION: review and modify strengthening and balance program, FU on vitals for orthostatic and exercise  2:52 PM, 12/21/22 Rosamaria Lints, PT, DPT Physical Therapist - [facility] in [place]  [phone] (Office)

## 2022-12-22 ENCOUNTER — Telehealth: Payer: Self-pay | Admitting: Cardiovascular Disease

## 2022-12-22 NOTE — Telephone Encounter (Signed)
Returned the call to the patient. He stated that after he had physical therapy, his heart rate was 44. His physical therapist recommended that he call his cardiologist.  The patient stated that according to his blood pressure machine he has had seven episodes of heart rates in the 40-45 range.  While on the phone, his heart rate was 68. He did say it was hard to tell if he was symptomatic because he has COPD. He has been advised to keep monitoring his heart rate for now and keep a log of these so we can see a consistency.

## 2022-12-22 NOTE — Telephone Encounter (Signed)
STAT if HR is under 50 or over 120 (normal HR is 60-100 beats per minute)  What is your heart rate? 60's  Do you have a log of your heart rate readings (document readings)? Dropped below 50 at least 8 times since 10/30  Do you have any other symptoms? No.   Physical Therapist suggest patient call cardiologist in regards to the HR dropping. Requesting call back.

## 2022-12-25 ENCOUNTER — Ambulatory Visit: Payer: Medicare Other

## 2022-12-25 DIAGNOSIS — R262 Difficulty in walking, not elsewhere classified: Secondary | ICD-10-CM | POA: Diagnosis not present

## 2022-12-25 DIAGNOSIS — M6281 Muscle weakness (generalized): Secondary | ICD-10-CM

## 2022-12-25 NOTE — Therapy (Cosign Needed)
OUTPATIENT PHYSICAL THERAPY TREATMENT    Patient Name: Sean Reyes. MRN: [id number] DOB:[birth year], 82 y.o., male Today's Date: 12/26/2022  END OF SESSION:  PT End of Session - 12/25/22 1110     Visit Number 12    Number of Visits 17    Date for PT Re-Evaluation 12/28/22    Authorization Type Medicare A&B; BCBS supplemental    Authorization Time Period eval: 11/02/2022    Progress Note Due on Visit 20    PT Start Time 1100    PT Stop Time 1140    PT Time Calculation (min) 40 min    Activity Tolerance Patient tolerated treatment well;Patient limited by pain    Behavior During Therapy WFL for tasks assessed/performed            Past Medical History:  Diagnosis Date   Anemia    Anxiety    Arthritis    Arthritis of neck    Atrial fibrillation (HCC)    Cataracts, bilateral    Complication of anesthesia    pt reports low BP's after surgery at [facility] and difficulty awakening   Depression    Diabetes (HCC)    dx 6-8 yrs ago   Dysrhythmia    a-fib   GERD (gastroesophageal reflux disease)    OCC TAKES ALKA SELTZER   History of kidney stones    10-15 yrs ago   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   Hyperlipidemia    Hypertension    Myocardial infarction (HCC) 12/2020   Nocturia    S/P ablation of atrial fibrillation    Ablative therapy   Sleep apnea    CPAP    Spinal stenosis    Tachycardia, unspecified    Past Surgical History:  Procedure Laterality Date   ABLATION     ANTERIOR LAT LUMBAR FUSION N/A 06/27/2017   Procedure: Anterior Lateral Lumbar Interbody  Fusion - Lumbar Two-Lumbar Three - Lumbar Three-Lumbar Four, Posterior Lumbar Interbody Fusion Lumbar Four- Five;  Surgeon: Donalee Citrin, MD;  Location: [facility] OR;  Service: Neurosurgery;  Laterality: N/A;  Anterior Lateral Lumbar Interbody  Fusion - Lumbar Two-Lumbar Three - Lumbar Three-Lumbar Four, Posterior Lumbar Interbody Fusion Lumbar Four- Five   BACK SURGERY     CARDIAC  CATHETERIZATION     CARDIOVERSION N/A 08/29/2018   Procedure: CARDIOVERSION;  Surgeon: Lamar Blinks, MD;  Location: [facility] ORS;  Service: Cardiovascular;  Laterality: N/A;   CARDIOVERSION N/A 09/24/2018   Procedure: CARDIOVERSION;  Surgeon: Lamar Blinks, MD;  Location: [facility] ORS;  Service: Cardiovascular;  Laterality: N/A;   COLONOSCOPY WITH PROPOFOL N/A 10/05/2015   Procedure: COLONOSCOPY WITH PROPOFOL;  Surgeon: Christena Deem, MD;  Location: [facility] ENDOSCOPY;  Service: Endoscopy;  Laterality: N/A;   COLONOSCOPY WITH PROPOFOL N/A 11/01/2020   Procedure: COLONOSCOPY WITH PROPOFOL;  Surgeon: Wyline Mood, MD;  Location: [facility] ENDOSCOPY;  Service: Gastroenterology;  Laterality: N/A;   CORONARY STENT INTERVENTION N/A 12/22/2020   Procedure: CORONARY STENT INTERVENTION;  Surgeon: Marcina Millard, MD;  Location: [facility] INVASIVE CV LAB;  Service: Cardiovascular;  Laterality: N/A;   ESOPHAGOGASTRODUODENOSCOPY N/A 11/01/2020   Procedure: ESOPHAGOGASTRODUODENOSCOPY (EGD);  Surgeon: Wyline Mood, MD;  Location: [facility] ENDOSCOPY;  Service: Gastroenterology;  Laterality: N/A;   ESOPHAGOGASTRODUODENOSCOPY (EGD) WITH PROPOFOL N/A 04/01/2018   Procedure: ESOPHAGOGASTRODUODENOSCOPY (EGD) WITH PROPOFOL;  Surgeon: Christena Deem, MD;  Location: [facility] ENDOSCOPY;  Service: Endoscopy;  Laterality: N/A;   ESOPHAGOGASTRODUODENOSCOPY (EGD) WITH PROPOFOL N/A 06/01/2022   Procedure:  ESOPHAGOGASTRODUODENOSCOPY (EGD) WITH PROPOFOL;  Surgeon: Wyline Mood, MD;  Location: [facility] ENDOSCOPY;  Service: Gastroenterology;  Laterality: N/A;   EYE SURGERY     HERNIA REPAIR     JOINT REPLACEMENT Bilateral    hips  RT+  LEFT X2    LEFT HEART CATH AND CORONARY ANGIOGRAPHY N/A 12/22/2020   Procedure: LEFT HEART CATH AND CORONARY ANGIOGRAPHY;  Surgeon: Marcina Millard, MD;  Location: [facility] INVASIVE CV LAB;  Service: Cardiovascular;  Laterality: N/A;   LEFT HEART CATH AND CORONARY ANGIOGRAPHY Left 08/23/2021   Procedure:  LEFT HEART CATH AND CORONARY ANGIOGRAPHY;  Surgeon: Lamar Blinks, MD;  Location: [facility] INVASIVE CV LAB;  Service: Cardiovascular;  Laterality: Left;   LUMBAR LAMINECTOMY/DECOMPRESSION MICRODISCECTOMY Left 09/13/2016   Procedure: Microdiscectomy - Lumbar two-three,  Lumbar three- - left;  Surgeon: Donalee Citrin, MD;  Location: [facility] OR;  Service: Neurosurgery;  Laterality: Left;   SPINAL CORD STIMULATOR INSERTION  07/08/2019   TONSILLECTOMY     Patient Active Problem List   Diagnosis Date Noted   Finger numbness 10/13/2022   Subareolar mass of left breast 10/03/2022   COPD (chronic obstructive pulmonary disease) (HCC) 10/31/2021   Shortness of breath on exertion 08/04/2021   Diabetes mellitus with proteinuria (HCC) 04/04/2021   GERD without esophagitis 04/02/2021   Coronary artery disease 12/28/2020   History of non-ST elevation myocardial infarction (NSTEMI) 12/21/2020   Pain in right shin 10/25/2020   Peripheral vascular disease (HCC) 08/06/2020   Chronic pain syndrome 07/06/2020   Cervical facet joint syndrome 04/08/2020   Spinal cord stimulator status 12/11/2019   Elevated TSH 08/20/2019   CKD (chronic kidney disease) stage 3, GFR 30-59 ml/min (HCC) 01/19/2019   Acquired thrombophilia (HCC) 01/19/2019   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   Postlaminectomy syndrome, lumbar region 01/16/2019   History of fusion of lumbar spine (L2-L5) 01/16/2019   Chronic radicular lumbar pain 01/16/2019   HNP (herniated nucleus pulposus), lumbar 04/29/2018   Advanced care planning/counseling discussion 09/28/2016   Spinal stenosis, lumbar region, 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/13/2016   Hyperlipidemia associated with type 2 diabetes mellitus (HCC) 07/14/2015   Symptomatic anemia 06/30/2015   Benign prostatic hyperplasia without lower urinary tract symptoms 06/02/2015   OSA (obstructive sleep apnea) 03/23/2015   Hypertension associated with diabetes (HCC) 09/28/2014   Diabetes mellitus with  autonomic neuropathy (HCC) 09/28/2014   H/O prior ablation treatment 10/19/2011   AF (paroxysmal atrial fibrillation) (HCC) 10/19/2011   PCP: Marjie Skiff, NP  REFERRING PROVIDER: Lonell Face, MD   REFERRING DIAG: R26.89 (ICD-10-CM) - Balance problem   RATIONALE FOR EVALUATION AND TREATMENT: Rehabilitation  THERAPY DIAG: Muscle weakness (generalized)  Difficulty in walking, not elsewhere classified  ONSET DATE: 2021  FOLLOW-UP APPT SCHEDULED WITH REFERRING PROVIDER: Yes    SUBJECTIVE:  SUBJECTIVE STATEMENT:  Leg pain, Difficulty with Walking, Imbalance    PERTINENT HISTORY:    Per Chart Review:    10/17/2022 "Patient reported he is not doing well lately. He has been seeing Dr. Myer Haff. He reports his legs are so weak that he can hardly walk, so he has been using a crutch for the last 1-2 years. Reported numbness and loss of sensation in his toes. He also reported that recently his left hand has began to has some numbness to where it feels like it is asleep. Denies pain, loss of sensation, coldness, burning. 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 "   Today:  Patient reports to physical therapy today with a chief concern of muscle weakness and imbalance. Patient reports progressive worsening in balance and muscular strength. He states that he has loss of sensation in his toes he reports they are "asleep".  Patient reports pain in the anterior thigh and bilateral calves. He currently uses an loftstand crutch in his RUE. Lately patient has reported difficulty with dressing LE, performing fine motor movements with left hand. He denies falls, saddle parasthesia, nausea, vomitting, night sweats.        Pain: Yes Numbness/Tingling: Yes Focal Weakness: Yes Recent changes in overall health/medication: No Prior history of physical therapy for balance:  Yes Dominant hand: right Imaging: Yes  Red flags: Negative for bowel/bladder changes, saddle paresthesia, personal history of cancer, h/o spinal tumors, h/o compression fx, h/o abdominal aneurysm, abdominal pain, chills/fever, night sweats, nausea, vomiting, unrelenting pain, first onset of insidious LBP <22 y/o   PRECAUTIONS: Fall   WEIGHT BEARING RESTRICTIONS: No   FALLS: Has patient fallen in last 6 months? 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  Recent memory is intact.  Remote memory is intact.  Attention span and concentration are intact.  Expressive speech is intact.  Patient's fund of knowledge is within normal limits for educational level.   Gross Musculoskeletal Assessment Tremor: None Bulk: Normal Tone: Normal    Posture: Rounded forward posture in seated. Standing posture: Forward and rounded in standing, limited cervicothoracic extension.    AROM Deferred   LE MMT: MMT (out of 5) Right   Left    Hip flexion 4 4-  Hip extension      Hip abduction      Hip adduction      Hip internal rotation 5 4-  Hip external rotation 5 4*  Knee flexion  Knee extension 4+ 4+  Ankle dorsiflexion 4- 4-  Ankle plantarflexion 5 5  Ankle inversion      Ankle eversion      (* = pain; Blank rows = not tested)   Sensation Grossly intact to light touch throughout bilateral LEs as determined by testing dermatomes L2-S2. Proprioception, stereognosis, and hot/cold testing deferred on this date.   Reflexes R/L Knee Jerk (L3/4): 2+/2+  Ankle Jerk (S1/2): 2+/2+    Cranial Nerves Deferred   Coordination/Cerebellar Finger to Nose: WNL Heel to Shin: Deferred Rapid alternating movements: WNL Finger Opposition: WNL Pronator Drift: Negative   Bed mobility: Deferred    Transfers: Assistive device utilized: None  Sit to stand: Complete Independence Stand to sit: Complete Independence Chair to chair: SBA Slowed, required few attempts at stand before transfer, definite use of hands  Floor: Deferred   Curb:  Deferred   Stairs: Deferred   Gait: Gait pattern: step through pattern Distance walked: 77m Assistive device utilized: Crutches Level of assistance: Complete Independence Comments: Slowed, Cautious gait with use of loftstrand crutch in RUE.    Functional Outcome Measures   Results Comments  BERG 39/56 2,4,4,3,3,4,4,3,3,2,2,2,2,1  DGI      FGA      TUG      5TSTS 16.65s  With UE support, unable to perform without BUE support.   6 Minute Walk Test      10 Meter Gait Speed Self-selected: 18s = .55 m/s;  Loftstrand RUE  (Blank rows = not tested)     TODAY'S TREATMENT:  Subjective: Patient feeling well today with minor lower back pain. Called his cardiologist following last PT  session; advised by RN to monitor HR and symptoms with exercises. No further questions or concerns.    Pain: "A little Bit" in lumbar region.   Neuromuscular Re-Education:  NuStep 0-1 x 8 min for warm up and history interval;  Step Taps without UE Support 2 x 10  STS from airex without UE support 2 x 10 (second set with 6# DB);  Forward Stepping over 6" and 12" in // bars x 4 trials;  Standing on Airex with Blaze Pods x 6 on first and second step and behind on 6" step, random protocol, LE deactivation x 1 min; Standing on Airex with Blaze Pods x 6 at first and second step, focus protocol, LE deactivation 2 bouts x 1 min; Nautilus with resistance, 20# Blaze Pods x 6 at in straight line in agility ladder, focus protocol, LE deactivation 2 bouts x 1 min;    PATIENT EDUCATION:  Education details: exercise modification to achieve best outcomes in session.  Person educated: Patient  Education method: Explanation and Handouts Education comprehension: verbalized understanding   HOME EXERCISE PROGRAM:  Access Code: [email or web address] URL: [email or web address] Date: 11/02/2022 Prepared by: Ria Comment  Exercises - Seated Long Arc Quad  - 1 x daily - 7 x weekly - 1-3 sets - 10 reps - Heel Raises with Counter Support  - 1 x daily - 7 x weekly - 1-3 sets - 10 reps - Semi-Tandem Balance at [employer] Eyes Open  - 1 x daily - 7 x weekly - 2-3 sets - 10 reps - 10-30 hold   ASSESSMENT:  CLINICAL IMPRESSION: Patient arrives to physical therapy highly motivated to improve dynamic balance and gait. In today's session patient presented with improved HR and less fatigue. HR monitored throughout session maintained between 60-109 bpm. PT trialed blazed pods in order to improve single leg balance,  motor coordination and reaction. Patient did well with focus protocol without major LOB at nautilus or stairs. Based on today's session patient will continue to benefit from skilled physical therapy  focused on improving balance and endurance in order to facilitate decreased fall risk and safety.    OBJECTIVE IMPAIRMENTS: decreased activity tolerance, decreased balance, decreased endurance, decreased mobility, difficulty walking, decreased strength, and pain.   ACTIVITY LIMITATIONS: carrying, lifting, bending, standing, squatting, stairs, transfers, and dressing  PARTICIPATION LIMITATIONS: cleaning, shopping, community activity, and yard work  PERSONAL FACTORS: Age, Fitness, Past/current experiences, Time since onset of injury/illness/exacerbation, and 3+ comorbidities: Peripheral neuropathy, SOB, COPD  are also affecting patient's functional outcome.   REHAB POTENTIAL: Good  CLINICAL DECISION MAKING: Evolving/moderate complexity  EVALUATION COMPLEXITY: Moderate   GOALS: Goals reviewed with patient? No  SHORT TERM GOALS: Target date: 01/23/2023   Pt will be independent with HEP in order to improve strength and balance in order to decrease fall risk and improve function at home. Baseline:  Goal status: INITIAL   LONG TERM GOALS: Target date: 02/20/2023  Pt will increase FOTO to at least 48 to demonstrate significant improvement in function at home related to balance  Baseline: 11/02/2022: 40; 12/11/2022: 45 Goal status: Progress  2.  Pt will improve BERG by at least 3 points in order to demonstrate clinically significant improvement in balance.   Baseline: 11/02/2022: 39/56; 12/11/2022: 48/56 Goal status: Goal Met  3.  Pt will improve ABC by at least 13% in order to demonstrate clinically significant improvement in balance confidence.      Baseline: 11/02/2022: 17.5%; 12/11/2022: 38.12 Goal status: Goal Met  4. Pt will decrease 5TSTS by at least 3 seconds in order to demonstrate clinically significant improvement in LE strength      Baseline: 11/02/2022: 16.65s; 12/07/2022: 9.85s; 12/11/2022: 8.54s Goal status: Goal Met  PLAN: PT FREQUENCY: 1-2x/week  PT DURATION: 8  weeks  PLANNED INTERVENTIONS: Therapeutic exercises, Therapeutic activity, Neuromuscular re-education, Balance training, Gait training, Patient/Family education, Self Care, Joint mobilization, Joint manipulation, Vestibular training, Canalith repositioning, Orthotic/Fit training, DME instructions, Dry Needling, Electrical stimulation, Spinal manipulation, Spinal mobilization, Cryotherapy, Moist heat, Taping, Traction, Ultrasound, Ionotophoresis 4mg /ml Dexamethasone, Manual therapy, and Re-evaluation.  PLAN FOR NEXT SESSION: review and modify strengthening and balance program   Lynnea Maizes PT, DPT, GCS  Sean Reyes, SPT 12/26/2022 10:21 AM

## 2022-12-25 NOTE — Telephone Encounter (Signed)
Called patient, advised of message from MD.  Patient states that he went to PT today and HR was normal today- and they will keep an eye on it.

## 2022-12-25 NOTE — Telephone Encounter (Signed)
That is fine as long as he is not having significant dizziness, syncope or presyncope.

## 2022-12-27 ENCOUNTER — Other Ambulatory Visit: Payer: Self-pay | Admitting: Urology

## 2022-12-31 NOTE — Therapy (Signed)
OUTPATIENT PHYSICAL THERAPY TREATMENT/RECERTIFICATION   Patient Name: Sean Reyes. MRN: [id number] DOB:12/15/1940, 82 y.o., male Today's Date: 12/31/2022  END OF SESSION:   Past Medical History:  Diagnosis Date   Anemia    Anxiety    Arthritis    Arthritis of neck    Atrial fibrillation (HCC)    Cataracts, bilateral    Complication of anesthesia    pt reports low BP's after surgery at [facility] and difficulty awakening   Depression    Diabetes (HCC)    dx 6-8 yrs ago   Dysrhythmia    a-fib   GERD (gastroesophageal reflux disease)    OCC TAKES ALKA SELTZER   History of kidney stones    10-15 yrs ago   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   Hyperlipidemia    Hypertension    Myocardial infarction (HCC) 12/2020   Nocturia    S/P ablation of atrial fibrillation    Ablative therapy   Sleep apnea    CPAP    Spinal stenosis    Tachycardia, unspecified    Past Surgical History:  Procedure Laterality Date   ABLATION     ANTERIOR LAT LUMBAR FUSION N/A 06/27/2017   Procedure: Anterior Lateral Lumbar Interbody  Fusion - Lumbar Two-Lumbar Three - Lumbar Three-Lumbar Four, Posterior Lumbar Interbody Fusion Lumbar Four- Five;  Surgeon: Donalee Citrin, MD;  Location: [facility] OR;  Service: Neurosurgery;  Laterality: N/A;  Anterior Lateral Lumbar Interbody  Fusion - Lumbar Two-Lumbar Three - Lumbar Three-Lumbar Four, Posterior Lumbar Interbody Fusion Lumbar Four- Five   BACK SURGERY     CARDIAC CATHETERIZATION     CARDIOVERSION N/A 08/29/2018   Procedure: CARDIOVERSION;  Surgeon: Lamar Blinks, MD;  Location: [facility] ORS;  Service: Cardiovascular;  Laterality: N/A;   CARDIOVERSION N/A 09/24/2018   Procedure: CARDIOVERSION;  Surgeon: Lamar Blinks, MD;  Location: [facility] ORS;  Service: Cardiovascular;  Laterality: N/A;   COLONOSCOPY WITH PROPOFOL N/A 10/05/2015   Procedure: COLONOSCOPY WITH PROPOFOL;  Surgeon: Christena Deem, MD;  Location: [facility] ENDOSCOPY;  Service:  Endoscopy;  Laterality: N/A;   COLONOSCOPY WITH PROPOFOL N/A 11/01/2020   Procedure: COLONOSCOPY WITH PROPOFOL;  Surgeon: Wyline Mood, MD;  Location: [facility] ENDOSCOPY;  Service: Gastroenterology;  Laterality: N/A;   CORONARY STENT INTERVENTION N/A 12/22/2020   Procedure: CORONARY STENT INTERVENTION;  Surgeon: Marcina Millard, MD;  Location: [facility] INVASIVE CV LAB;  Service: Cardiovascular;  Laterality: N/A;   ESOPHAGOGASTRODUODENOSCOPY N/A 11/01/2020   Procedure: ESOPHAGOGASTRODUODENOSCOPY (EGD);  Surgeon: Wyline Mood, MD;  Location: [facility] ENDOSCOPY;  Service: Gastroenterology;  Laterality: N/A;   ESOPHAGOGASTRODUODENOSCOPY (EGD) WITH PROPOFOL N/A 04/01/2018   Procedure: ESOPHAGOGASTRODUODENOSCOPY (EGD) WITH PROPOFOL;  Surgeon: Christena Deem, MD;  Location: [facility] ENDOSCOPY;  Service: Endoscopy;  Laterality: N/A;   ESOPHAGOGASTRODUODENOSCOPY (EGD) WITH PROPOFOL N/A 06/01/2022   Procedure: ESOPHAGOGASTRODUODENOSCOPY (EGD) WITH PROPOFOL;  Surgeon: Wyline Mood, MD;  Location: [facility] ENDOSCOPY;  Service: Gastroenterology;  Laterality: N/A;   EYE SURGERY     HERNIA REPAIR     JOINT REPLACEMENT Bilateral    hips  RT+  LEFT X2    LEFT HEART CATH AND CORONARY ANGIOGRAPHY N/A 12/22/2020   Procedure: LEFT HEART CATH AND CORONARY ANGIOGRAPHY;  Surgeon: Marcina Millard, MD;  Location: [facility] INVASIVE CV LAB;  Service: Cardiovascular;  Laterality: N/A;   LEFT HEART CATH AND CORONARY ANGIOGRAPHY Left 08/23/2021   Procedure: LEFT HEART CATH AND CORONARY ANGIOGRAPHY;  Surgeon: Lamar Blinks, MD;  Location: [facility] INVASIVE  CV LAB;  Service: Cardiovascular;  Laterality: Left;   LUMBAR LAMINECTOMY/DECOMPRESSION MICRODISCECTOMY Left 09/13/2016   Procedure: Microdiscectomy - Lumbar two-three,  Lumbar three- - left;  Surgeon: Donalee Citrin, MD;  Location: [facility] OR;  Service: Neurosurgery;  Laterality: Left;   SPINAL CORD STIMULATOR INSERTION  07/08/2019   TONSILLECTOMY     Patient Active Problem List   Diagnosis  Date Noted   Finger numbness 10/13/2022   Subareolar mass of left breast 10/03/2022   COPD (chronic obstructive pulmonary disease) (HCC) 10/31/2021   Shortness of breath on exertion 08/04/2021   Diabetes mellitus with proteinuria (HCC) 04/04/2021   GERD without esophagitis 04/02/2021   Coronary artery disease 12/28/2020   History of non-ST elevation myocardial infarction (NSTEMI) 12/21/2020   Pain in right shin 10/25/2020   Peripheral vascular disease (HCC) 08/06/2020   Chronic pain syndrome 07/06/2020   Cervical facet joint syndrome 04/08/2020   Spinal cord stimulator status 12/11/2019   Elevated TSH 08/20/2019   CKD (chronic kidney disease) stage 3, GFR 30-59 ml/min (HCC) 01/19/2019   Acquired thrombophilia (HCC) 01/19/2019   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   Postlaminectomy syndrome, lumbar region 01/16/2019   History of fusion of lumbar spine (L2-L5) 01/16/2019   Chronic radicular lumbar pain 01/16/2019   HNP (herniated nucleus pulposus), lumbar 04/29/2018   Advanced care planning/counseling discussion 09/28/2016   Spinal stenosis, lumbar region, 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/13/2016   Hyperlipidemia associated with type 2 diabetes mellitus (HCC) 07/14/2015   Symptomatic anemia 06/30/2015   Benign prostatic hyperplasia without lower urinary tract symptoms 06/02/2015   OSA (obstructive sleep apnea) 03/23/2015   Hypertension associated with diabetes (HCC) 09/28/2014   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HCC) 09/28/2014   H/O prior ablation treatment 10/19/2011   AF (paroxysmal atrial fibrillation) (HCC) 10/19/2011   PCP: Marjie Skiff, NP  REFERRING PROVIDER: Lonell Face, MD   REFERRING DIAG: R26.89 (ICD-10-CM) - Balance problem   RATIONALE FOR EVALUATION AND TREATMENT: Rehabilitation  THERAPY DIAG: Muscle weakness (generalized)  Difficulty in walking, not elsewhere classified  ONSET DATE: 2021  FOLLOW-UP APPT SCHEDULED WITH REFERRING  PROVIDER: Yes    SUBJECTIVE:                                                                                                                                                                                          SUBJECTIVE STATEMENT:  Leg pain, Difficulty with Walking, Imbalance    PERTINENT HISTORY:    Per Chart Review:    10/17/2022 "Patient reported he is not doing well lately. He has been seeing Dr. Myer Haff. He reports his legs are so weak that he  can hardly walk, so he has been using a crutch for the last 1-2 years. Reported numbness and loss of sensation in his toes. He also reported that recently his left hand has began to has some numbness to where it feels like it is asleep. Denies pain, loss of sensation, coldness, burning. 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 "   Today:  Patient reports to physical therapy today with a chief concern of muscle weakness and imbalance. Patient reports progressive worsening in balance and muscular strength. He states that he has loss of sensation in his toes he reports they are "asleep". Patient reports pain in the anterior thigh and bilateral calves. He currently uses an loftstand crutch in his RUE. Lately patient has reported difficulty with dressing LE, performing fine motor movements with left hand. He denies falls, saddle parasthesia, nausea, vomitting, night sweats.        Pain: Yes Numbness/Tingling: Yes Focal Weakness: Yes Recent changes in overall health/medication: No Prior history of physical therapy for balance:  Yes Dominant hand: right Imaging:  Yes  Red flags: Negative for bowel/bladder changes, saddle paresthesia, personal history of cancer, h/o spinal tumors, h/o compression fx, h/o abdominal aneurysm, abdominal pain, chills/fever, night sweats, nausea, vomiting, unrelenting pain, first onset of insidious LBP <22 y/o   PRECAUTIONS: Fall   WEIGHT BEARING RESTRICTIONS: No   FALLS: Has patient fallen in last 6 months? 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  Recent memory is intact.  Remote memory is intact.  Attention span and concentration are intact.  Expressive speech is intact.  Patient's fund of knowledge is within normal limits for educational level.   Gross Musculoskeletal Assessment Tremor: None Bulk: Normal Tone: Normal   Posture: Rounded forward posture in seated. Standing posture: Forward and rounded in standing, limited cervicothoracic extension.    AROM Deferred   LE MMT: MMT (out of 5) Right   Left    Hip flexion 4 4-  Hip extension      Hip abduction      Hip adduction      Hip internal rotation 5 4-  Hip external rotation 5 4*  Knee flexion      Knee extension 4+ 4+  Ankle dorsiflexion 4- 4-  Ankle plantarflexion 5 5  Ankle inversion      Ankle eversion      (* = pain; Blank rows = not  tested)   Sensation Grossly intact to light touch  throughout bilateral LEs as determined by testing dermatomes L2-S2. Proprioception, stereognosis, and hot/cold testing deferred on this date.   Reflexes R/L Knee Jerk (L3/4): 2+/2+  Ankle Jerk (S1/2): 2+/2+    Cranial Nerves Deferred   Coordination/Cerebellar Finger to Nose: WNL Heel to Shin: Deferred Rapid alternating movements: WNL Finger Opposition: WNL Pronator Drift: Negative   Bed mobility: Deferred    Transfers: Assistive device utilized: None  Sit to stand: Complete Independence Stand to sit: Complete Independence Chair to chair: SBA Slowed, required few attempts at stand before transfer, definite use of hands  Floor: Deferred   Curb:  Deferred   Stairs: Deferred   Gait: Gait pattern: step through pattern Distance walked: 37m Assistive device utilized: Crutches Level of assistance: Complete Independence Comments: Slowed, Cautious gait with use of loftstrand crutch in RUE.    Functional Outcome Measures   Results Comments  BERG 39/56 2,4,4,3,3,4,4,3,3,2,2,2,2,1  DGI      FGA      TUG      5TSTS 16.65s  With UE support, unable to perform without BUE support.   6 Minute Walk Test      10 Meter Gait Speed Self-selected: 18s = .55 m/s;  Loftstrand RUE  (Blank rows = not tested)     TODAY'S TREATMENT:  Subjective: Patient feeling well today with minor lower back pain. Called his cardiologist following last PT session; advised by RN to monitor HR and symptoms with exercises. No further questions or concerns.    Pain: "A little Bit" in lumbar region.   Neuromuscular Re-Education:  NuStep 0-1 x 8 min for warm up and history interval;  Step Taps without UE Support 2 x 10  STS from airex without UE support 2 x 10 (second set with 6# DB);  Forward Stepping over 6" and 12" in // bars x 4 trials;  Standing on Airex with Blaze Pods x 6 on first and second step and behind on 6" step, random protocol, LE  deactivation x 1 min; Standing on Airex with Blaze Pods x 6 at first and second step, focus protocol, LE deactivation 2 bouts x 1 min; Nautilus with resistance, 20# Blaze Pods x 6 at in straight line in agility ladder, focus protocol, LE deactivation 2 bouts x 1 min;    PATIENT EDUCATION:  Education details: exercise modification to achieve best outcomes in session.  Person educated: Patient  Education method: Explanation and Handouts Education comprehension: verbalized understanding   HOME EXERCISE PROGRAM:  Access Code: [email or web address] URL: [email or web address] Date: 11/02/2022 Prepared by: Ria Comment  Exercises - Seated Long Arc Quad  - 1 x daily - 7 x weekly - 1-3 sets - 10 reps - Heel Raises with Counter Support  - 1 x daily - 7 x weekly - 1-3 sets - 10 reps - Semi-Tandem Balance at [employer] Eyes Open  - 1 x daily - 7 x weekly - 2-3 sets - 10 reps - 10-30 hold   ASSESSMENT:  CLINICAL IMPRESSION: Patient arrived to physical therapy highly motivated to improve balance and LE strength. Today's session with a main focus on reassessing progress towards goals. He has demonstrated significant improvements in balance confidence and overall balance as noted in today's outcome measures. Patient demonstrated ability to perform short single leg stance, tandem stance, and step taps on the BERG independently. Additionally the patient has demonstrated significant improvements in LE strength; pt able to perform 5TSTS independently within < 12s without UE support.  He still presents with decreased dynamic  balance and is limited 2/2 lumbar pain. No updates in today's session. PT plans to continue progressing dynamic balance. Based on today's session pt will continue benefit from PT services 1-2x/ week to address deficits in strength, balance, and mobility in order to return to full function at home.  Patient arrives to physical therapy highly motivated to improve dynamic balance and  gait. In today's session patient presented with improved HR and less fatigue. HR monitored throughout session maintained between 60-109 bpm. PT trialed blazed pods in order to improve single leg balance, motor coordination and reaction. Patient did well with focus protocol without major LOB at nautilus or stairs. Based on today's session patient will continue to benefit from skilled physical therapy focused on improving balance and endurance in order to facilitate decreased fall risk and safety.    OBJECTIVE IMPAIRMENTS: decreased activity tolerance, decreased balance, decreased endurance, decreased mobility, difficulty walking, decreased strength, and pain.   ACTIVITY LIMITATIONS: carrying, lifting, bending, standing, squatting, stairs, transfers, and dressing  PARTICIPATION LIMITATIONS: cleaning, shopping, community activity, and yard work  PERSONAL FACTORS: Age, Fitness, Past/current experiences, Time since onset of injury/illness/exacerbation, and 3+ comorbidities: Peripheral neuropathy, SOB, COPD  are also affecting patient's functional outcome.   REHAB POTENTIAL: Good  CLINICAL DECISION MAKING: Evolving/moderate complexity  EVALUATION COMPLEXITY: Moderate   GOALS: Goals reviewed with patient? No  SHORT TERM GOALS: Target date: 01/28/2023   Pt will be independent with HEP in order to improve strength and balance in order to decrease fall risk and improve function at home. Baseline:  Goal status: INITIAL   LONG TERM GOALS: Target date: 02/25/2023  Pt will increase FOTO to at least 48 to demonstrate significant improvement in function at home related to balance  Baseline: 11/02/2022: 40; 12/11/2022: 45 Goal status: Progress  2.  Pt will improve BERG by at least 3 points in order to demonstrate clinically significant improvement in balance.   Baseline: 11/02/2022: 39/56; 12/11/2022: 48/56 Goal status: Goal Met  3.  Pt will improve ABC by at least 13% in order to demonstrate  clinically significant improvement in balance confidence.      Baseline: 11/02/2022: 17.5%; 12/11/2022: 38.12 Goal status: Goal Met  4. Pt will decrease 5TSTS by at least 3 seconds in order to demonstrate clinically significant improvement in LE strength      Baseline: 11/02/2022: 16.65s; 12/07/2022: 9.85s; 12/11/2022: 8.54s Goal status: Goal Met  PLAN: PT FREQUENCY: 1-2x/week  PT DURATION: 8 weeks  PLANNED INTERVENTIONS: Therapeutic exercises, Therapeutic activity, Neuromuscular re-education, Balance training, Gait training, Patient/Family education, Self Care, Joint mobilization, Joint manipulation, Vestibular training, Canalith repositioning, Orthotic/Fit training, DME instructions, Dry Needling, Electrical stimulation, Spinal manipulation, Spinal mobilization, Cryotherapy, Moist heat, Taping, Traction, Ultrasound, Ionotophoresis 4mg /ml Dexamethasone, Manual therapy, and Re-evaluation.  PLAN FOR NEXT SESSION: review and modify strengthening and balance program   Lynnea Maizes PT, DPT, GCS  12/31/2022 5:28 PM

## 2022-12-31 NOTE — Patient Instructions (Incomplete)
Be Involved in Caring For Your Health:  Taking Medications When medications are taken as directed, they can greatly improve your health. But if they are not taken as prescribed, they may not work. In some cases, not taking them correctly can be harmful. To help ensure your treatment remains effective and safe, understand your medications and how to take them. Bring your medications to each visit for review by your provider.  Your lab results, notes, and after visit summary will be available on My Chart. We strongly encourage you to use this feature. If lab results are abnormal the clinic will contact you with the appropriate steps. If the clinic does not contact you assume the results are satisfactory. You can always view your results on My Chart. If you have questions regarding your health or results, please contact the clinic during office hours. You can also ask questions on My Chart.  We at [facility] are grateful that you chose [place] to provide your care. We strive to provide evidence-based and compassionate care and are always looking for feedback. If you get a survey from the clinic please complete this so we can hear your opinions.  Diabetes Mellitus and Exercise Regular exercise is important for your health, especially if you have diabetes mellitus. Exercise is not just about losing weight. It can also help you increase muscle strength and bone density and reduce body fat and stress. This can help your level of endurance and make you more fit and flexible. Why should I exercise if I have diabetes? Exercise has many benefits for people with diabetes. It can: Help lower and control your blood sugar (glucose). Help your body respond better and become more sensitive to the hormone insulin. Reduce how much insulin your body needs. Lower your risk for heart disease by: Lowering how much "bad" cholesterol and triglycerides you have in your body. Increasing how much "good" cholesterol  you have in your body. Lowering your blood pressure. Lowering your blood glucose levels. What is my activity plan? Your health care provider or an expert trained in diabetes care (certified diabetes educator) can help you make an activity plan. This plan can help you find the type of exercise that works for you. It may also tell you how often to exercise and for how long. Be sure to: Get at least 150 minutes of medium-intensity or high-intensity exercise each week. This may involve brisk walking, biking, or water aerobics. Do stretching and strengthening exercises at least 2 times a week. This may involve yoga or weight lifting. Spread out your activity over at least 3 days of the week. Get some form of physical activity each day. Do not go more than 2 days in a row without some kind of activity. Avoid being inactive for more than 30 minutes at a time. Take frequent breaks to walk or stretch. Choose activities that you enjoy. Set goals that you know you can accomplish. Start slowly and increase the intensity of your exercise over time. How do I manage my diabetes during exercise?  Monitor your blood glucose Check your blood glucose before and after you exercise. If your blood glucose is 240 mg/dL (40.9 mmol/L) or higher before you exercise, check your urine for ketones. These are chemicals created by the liver. If you have ketones in your urine, do not exercise until your blood glucose returns to normal. If your blood glucose is 100 mg/dL (5.6 mmol/L) or lower, eat a snack that has 15-20 grams of carbohydrate in  it. Check your blood glucose 15 minutes after the snack to make sure that your level is above 100 mg/dL (5.6 mmol/L) before you start to exercise. Your risk for low blood glucose (hypoglycemia) goes up during and after exercise. Know the symptoms of this condition and how to treat it. Follow these instructions at home: Keep a carbohydrate snack on hand for use before, during, and after  exercise. This can help prevent or treat hypoglycemia. Avoid injecting insulin into parts of your body that are going to be used during exercise. This may include: Your arms, when you are going to play tennis. Your legs, when you are about to go jogging. Keep track of your exercise habits. This can help you and your health care provider watch and adjust your activity plan. Write down: What you eat before and after you exercise. Blood glucose levels before and after you exercise. The type and amount of exercise you do. Talk to your health care provider before you start a new activity. They may need to: Make sure that the activity is safe for you. Adjust your insulin, other medicines, and food that you eat. Drink water while you exercise. This can stop you from losing too much water (dehydration). It can also prevent problems caused by having a lot of heat in your body (heat stroke). Where to find more information American Diabetes Association: diabetes.org Association of Diabetes Care & Education Specialists: diabeteseducator.org This information is not intended to replace advice given to you by your health care provider. Make sure you discuss any questions you have with your health care provider. Document Revised: 07/20/2021 Document Reviewed: 07/20/2021 Elsevier Patient Education  2024 [employer].

## 2023-01-01 ENCOUNTER — Ambulatory Visit: Payer: Medicare Other

## 2023-01-01 DIAGNOSIS — M6281 Muscle weakness (generalized): Secondary | ICD-10-CM | POA: Diagnosis not present

## 2023-01-01 DIAGNOSIS — R262 Difficulty in walking, not elsewhere classified: Secondary | ICD-10-CM | POA: Diagnosis not present

## 2023-01-03 ENCOUNTER — Other Ambulatory Visit: Payer: Self-pay | Admitting: Cardiology

## 2023-01-03 ENCOUNTER — Encounter: Payer: Self-pay | Admitting: Nurse Practitioner

## 2023-01-03 ENCOUNTER — Ambulatory Visit (INDEPENDENT_AMBULATORY_CARE_PROVIDER_SITE_OTHER): Payer: Medicare Other | Admitting: Nurse Practitioner

## 2023-01-03 VITALS — BP 113/64 | HR 90 | Temp 97.9°F | Ht 72.0 in | Wt 183.4 lb

## 2023-01-03 DIAGNOSIS — E1129 Type 2 diabetes mellitus with other diabetic kidney complication: Secondary | ICD-10-CM

## 2023-01-03 DIAGNOSIS — E1143 Type 2 diabetes mellitus with diabetic autonomic (poly)neuropathy: Secondary | ICD-10-CM

## 2023-01-03 DIAGNOSIS — I25110 Atherosclerotic heart disease of native coronary artery with unstable angina pectoris: Secondary | ICD-10-CM | POA: Diagnosis not present

## 2023-01-03 DIAGNOSIS — I480 Paroxysmal atrial fibrillation: Secondary | ICD-10-CM | POA: Diagnosis not present

## 2023-01-03 DIAGNOSIS — R809 Proteinuria, unspecified: Secondary | ICD-10-CM | POA: Diagnosis not present

## 2023-01-03 DIAGNOSIS — I152 Hypertension secondary to endocrine disorders: Secondary | ICD-10-CM | POA: Diagnosis not present

## 2023-01-03 DIAGNOSIS — D6869 Other thrombophilia: Secondary | ICD-10-CM | POA: Diagnosis not present

## 2023-01-03 DIAGNOSIS — E1169 Type 2 diabetes mellitus with other specified complication: Secondary | ICD-10-CM

## 2023-01-03 DIAGNOSIS — E785 Hyperlipidemia, unspecified: Secondary | ICD-10-CM | POA: Diagnosis not present

## 2023-01-03 DIAGNOSIS — E1159 Type 2 diabetes mellitus with other circulatory complications: Secondary | ICD-10-CM

## 2023-01-03 DIAGNOSIS — I739 Peripheral vascular disease, unspecified: Secondary | ICD-10-CM

## 2023-01-03 DIAGNOSIS — J449 Chronic obstructive pulmonary disease, unspecified: Secondary | ICD-10-CM

## 2023-01-03 DIAGNOSIS — N1831 Chronic kidney disease, stage 3a: Secondary | ICD-10-CM

## 2023-01-03 LAB — BAYER DCA HB A1C WAIVED: HB A1C (BAYER DCA - WAIVED): 6.9 % — ABNORMAL HIGH (ref 4.8–5.6)

## 2023-01-03 NOTE — Assessment & Plan Note (Signed)
Chronic, ongoing.  Followed by cardiology at this time.  Continue current medication regimen and collaboration, appreciate their input.  HR with stable rate today.

## 2023-01-03 NOTE — Telephone Encounter (Signed)
Last visit 09/26/22 with plan to f/u in 6 months.    Next visit:  none/active recall

## 2023-01-03 NOTE — Assessment & Plan Note (Signed)
Chronic, ongoing.  Continue current medication regimen, tolerating 5 MG Crestor well without ADR, and adjust as needed.  Lipid panel today.

## 2023-01-03 NOTE — Assessment & Plan Note (Addendum)
Chronic, ongoing CKD 3a.  Continue to monitor closely and refer to nephrology if decline.  Continue Benazepril for kidney protection and proteinuria.  Renal dose medications as needed based on labs.  Would benefit from SGLT2, but refuses due to concern for increased urination which he already struggles with.

## 2023-01-03 NOTE — Assessment & Plan Note (Signed)
Chronic, ongoing with A1c 6.9% today and urine ALB 21 (February 2024).  Continues to have stable BS control.  Will continue current medication regimen and adjust as needed.  Recommend he check BS at least 3 times daily.  Continue Benazepril for kidney protection. Discussed possible change to Jardiance vs Metformin for kidney and heart health, but too costly and he does not want to urinate more. - Vaccinations up to date - ACE and Statin on board - Foot and eye exam up to date.

## 2023-01-03 NOTE — Assessment & Plan Note (Signed)
Chronic, stable.  BP at goal in office today.  A1c 6.9% today, remaining stable and urine ALB 05 April 2022.  Continue Metformin and current cardiac medications as ordered.  Will continue collaboration with cardiology, appreciate their input, recent notes reviewed.  Recommend he continue to check BP at home at least 3 mornings a week and document.  DASH diet.  LABS: A1c and CMP.  - Statin and ACE on board - Vaccinations up to date - Foot and eye exams up to date

## 2023-01-03 NOTE — Assessment & Plan Note (Signed)
Patient on Eliquis with A-fib, monitor CBC regularly.

## 2023-01-03 NOTE — Assessment & Plan Note (Signed)
Chronic, stable.  Followed by pulmonary at this time, continue this collaboration.  He is using Anoro without issue.  Reports some improvement in SOB.

## 2023-01-03 NOTE — Assessment & Plan Note (Signed)
Chronic, ongoing with A1c 6.9% today, trend down, and urine ALB 21 (February 2024).  Significant decreased sensation bilateral feet, monitor closely for wounds and falls.  Continue current diabetes medication regimen and adjust as needed. He did not get benefit from Lyrica or Gabapentin, will continue collaboration with neurology and pain clinic as needed.  Continue to monitor BS daily and document for visits. LABS: A1c and CMP. Did find benefit from performing physical therapy recently. - Statin and ACE on board - Vaccinations up to date - Foot and eye exams up to date

## 2023-01-03 NOTE — Assessment & Plan Note (Signed)
Ongoing, continue collaboration with cardiology and current medication regimen.  Recent notes reviewed.   

## 2023-01-03 NOTE — Telephone Encounter (Signed)
Please advise If ok to refill historical medication.

## 2023-01-03 NOTE — Assessment & Plan Note (Signed)
Chronic, stable.  To bilateral lower extremity.  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  Continue ASA daily and monitor closely for any wounds.  Return to vascular as needed. 

## 2023-01-03 NOTE — Progress Notes (Signed)
BP 113/64   Pulse 90   Temp 97.9 F (36.6 C) (Oral)   Ht 6' (1.829 m)   Wt 183 lb 6.4 oz (83.2 kg)   SpO2 97%   BMI 24.87 kg/m    Subjective:    Patient ID: Sean Flax., male    DOB: [birth year], 82 y.o.   MRN: [id number]  HPI: Sean Meade. is a 82 y.o. male  Chief Complaint  Patient presents with   Atrial Fibrillation   Chronic Kidney Disease   Pain   Diabetes   Hyperlipidemia   Hypertension   DIABETES WITH NEUROPATHY A1c September 7.2%.  Continues Metformin 500 MG BID. Hypoglycemic episodes:no Polydipsia/polyuria: no Visual disturbance: no Chest pain: no Paresthesias: no Glucose Monitoring: yes  Accucheck frequency: 3 times a week   Fasting glucose: 140 range  Post prandial:  Evening:  Before meals: Taking Insulin?: no  Long acting insulin:  Short acting insulin: Blood Pressure Monitoring: daily Retinal Examination: Up To Date -- Dr. Beverely Pace in [place] Foot Exam: Up to Date Pneumovax: Up to Date Influenza: Up to Date Aspirin: no    HYPERTENSION / HYPERLIPIDEMIA Last visit with cardiology on 09/26/22.  Metoprolol XL discontinued in past due to symptomatic bradycardia.  Taking Amlodipine, Benazepril, and Rosuvastatin 5 MG for HLD.  Last saw pulmonary on 08/08/22 --  ordered Anoro and patient reports he is using this -- does report some increased cost with this in donut hole.  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  Continues CPAP 100% of the time. Satisfied with current treatment? yes Duration of hypertension: chronic BP monitoring frequency: daily - checks at same time he takes medication BP range: 131/76 to 151/86 BP medication side effects: no Duration of hyperlipidemia: chronic Cholesterol medication side effects: no Cholesterol supplements: none Medication compliance: good compliance Aspirin: no Recent stressors: no Recurrent headaches: no Visual changes: no Palpitations: on occasion Dyspnea: occasional during vigorous  activity at baseline Chest pain: no Lower extremity edema: no Dizzy/lightheaded: occasional if too much exertion  ATRIAL FIBRILLATION Followed by cardiology.   Atrial fibrillation status: stable Satisfied with current treatment: yes  Medication side effects:  no Medication compliance: good compliance Etiology of atrial fibrillation: unknown Palpitations: as above Chest pain:  no Dyspnea on exertion: as above Orthopnea:  no Syncope:  no Edema:  no Ventricular rate control: B-blocker Anti-coagulation: long acting   CHRONIC KIDNEY DISEASE CKD 3a.  Taking Tamsulosin, Proscar, and Vesicare per urology, last visit 04/19/22. CKD status: stable Medications renally dose: yes Previous renal evaluation: no Pneumovax:  Up to Date Influenza Vaccine:  Up to Date   CHRONIC PAIN  Has pain stimulator  Ongoing neuropathy issues at baseline. Was followed by PT recently for strengthening, recently graduated on 01/01/23.  This has helped his balance.  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  Saw them on 10/17/22, Alpha Lipoic Acid recommended. Pain control status: stable Duration: years Location: back Quality: dull, aching, and throbbing Current Pain Level: improved with pain stimulator 4/10 Previous Pain Level: moderate 7-8/10 Breakthrough pain: no What Activities task can be accomplished with current medication? yes Interested in weaning off narcotics:no   Stool softners/OTC fiber: no  Previous pain specialty evaluation: yes Non-narcotic analgesic meds: no Narcotic contract: no  Relevant past medical, surgical, family and social history reviewed and updated as indicated. Interim medical history since our last visit reviewed. Allergies and medications reviewed and updated.  Review of Systems  Constitutional:  Negative for activity change, appetite change, diaphoresis, fatigue and fever.  Respiratory:  Negative for cough, chest tightness, shortness of breath and  wheezing.   Cardiovascular:  Negative for chest pain, palpitations and leg swelling.  Gastrointestinal: Negative.   Endocrine: Negative for cold intolerance, heat intolerance, polydipsia, polyphagia and polyuria.  Neurological: Negative.   Psychiatric/Behavioral: Negative.     Per HPI unless specifically indicated above     Objective:    BP 113/64   Pulse 90   Temp 97.9 F (36.6 C) (Oral)   Ht 6' (1.829 m)   Wt 183 lb 6.4 oz (83.2 kg)   SpO2 97%   BMI 24.87 kg/m   Wt Readings from Last 3 Encounters:  01/03/23 183 lb 6.4 oz (83.2 kg)  10/13/22 184 lb 6.4 oz (83.6 kg)  10/11/22 185 lb 12.8 oz (84.3 kg)    Physical Exam Vitals and nursing note reviewed.  Constitutional:      General: He is awake. He is not in acute distress.    Appearance: Normal appearance. He is well-developed and well-groomed. He is not ill-appearing or toxic-appearing.     Comments: Gait stable with his cane support.  HENT:     Head: Normocephalic.     Right Ear: Hearing and external ear normal.     Left Ear: Hearing and external ear normal.  Eyes:     General: Lids are normal.     Extraocular Movements: Extraocular movements intact.     Conjunctiva/sclera: Conjunctivae normal.  Neck:     Thyroid: No thyromegaly.     Vascular: No carotid bruit.  Cardiovascular:     Rate and Rhythm: Normal rate. Rhythm irregularly irregular.     Heart sounds: Normal heart sounds. No murmur heard.    No gallop.  Pulmonary:     Effort: No accessory muscle usage or respiratory distress.     Breath sounds: Normal breath sounds.  Abdominal:     General: Bowel sounds are normal. There is no distension.     Palpations: Abdomen is soft.     Tenderness: There is no abdominal tenderness.  Musculoskeletal:     Cervical back: Full passive range of motion without pain.     Right lower leg: No edema.     Left lower leg: No edema.  Lymphadenopathy:     Cervical: No cervical adenopathy.  Skin:    General: Skin is warm.      Capillary Refill: Capillary refill takes less than 2 seconds.  Neurological:     Mental Status: He is alert and oriented to person, place, and time.     Deep Tendon Reflexes: Reflexes are normal and symmetric.     Reflex Scores:      Brachioradialis reflexes are 2+ on the right side and 2+ on the left side.      Patellar reflexes are 2+ on the right side and 2+ on the left side. Psychiatric:        Attention and Perception: Attention normal.        Mood and Affect: Mood normal.        Speech: Speech normal.        Behavior: Behavior normal. Behavior is cooperative.        Thought Content: Thought content normal.    Results for orders placed or performed in visit on 10/20/22  HM DIABETES EYE EXAM  Result Value Ref Range   HM Diabetic Eye Exam No Retinopathy No Retinopathy  Assessment & Plan:   Problem List Items Addressed This Visit       Cardiovascular and Mediastinum   AF (paroxysmal atrial fibrillation) (HCC)    Chronic, ongoing.  Followed by cardiology at this time.  Continue current medication regimen and collaboration, appreciate their input.  HR with stable rate today.      Relevant Orders   Comp Met (CMET)   Lipid Panel w/o Chol/HDL Ratio   Coronary artery disease    Ongoing, continue collaboration with cardiology and current medication regimen.  Recent notes reviewed.        Relevant Orders   Comp Met (CMET)   Lipid Panel w/o Chol/HDL Ratio   Hypertension associated with diabetes (HCC)    Chronic, stable.  BP at goal in office today.  A1c 6.9% today, remaining stable and urine ALB 05 April 2022.  Continue Metformin and current cardiac medications as ordered.  Will continue collaboration with cardiology, appreciate their input, recent notes reviewed.  Recommend he continue to check BP at home at least 3 mornings a week and document.  DASH diet.  LABS: A1c and CMP.  - Statin and ACE on board - Vaccinations up to date - Foot and eye exams up to date       Relevant Orders   Comp Met (CMET)   Lipid Panel w/o Chol/HDL Ratio   Peripheral vascular disease (HCC)    Chronic, stable.  To bilateral lower extremity.  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  Continue ASA daily and monitor closely for any wounds.  Return to vascular as needed.        Respiratory   COPD (chronic obstructive pulmonary disease) (HCC)    Chronic, stable.  Followed by pulmonary at this time, continue this collaboration.  He is using Anoro without issue.  Reports some improvement in SOB.        Endocrine   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HCC)    Chronic, ongoing with A1c 6.9% today, trend down, and urine ALB 21 (February 2024).  Significant decreased sensation bilateral feet, monitor closely for wounds and falls.  Continue current diabetes medication regimen and adjust as needed. He did not get benefit from Lyrica or Gabapentin, will continue collaboration with neurology and pain clinic as needed.  Continue to monitor BS daily and document for visits. LABS: A1c and CMP. Did find benefit from performing physical therapy recently. - Statin and ACE on board - Vaccinations up to date - Foot and eye exams up to date      Relevant Orders   Bayer DCA Hb A1c Waived (STAT)   Diabetes mellitus with proteinuria (HCC) - Primary    Chronic, ongoing with A1c 6.9% today and urine ALB 21 (February 2024).  Continues to have stable BS control.  Will continue current medication regimen and adjust as needed.  Recommend he check BS at least 3 times daily.  Continue Benazepril for kidney protection. Discussed possible change to Jardiance vs Metformin for kidney and heart health, but too costly and he does not want to urinate more. - Vaccinations up to date - ACE and Statin on board - Foot and eye exam up to date.      Relevant Orders   Bayer DCA Hb A1c Waived (STAT)   Hyperlipidemia associated with type 2 diabetes mellitus (HCC)    Chronic, ongoing.  Continue current medication regimen,  tolerating 5 MG Crestor well without ADR, and adjust as needed.  Lipid panel today.      Relevant  Orders   Comp Met (CMET)   Lipid Panel w/o Chol/HDL Ratio     Genitourinary   CKD (chronic kidney disease) stage 3, GFR 30-59 ml/min (HCC)    Chronic, ongoing CKD 3a.  Continue to monitor closely and refer to nephrology if decline.  Continue Benazepril for kidney protection and proteinuria.  Renal dose medications as needed based on labs.  Would benefit from SGLT2, but refuses due to concern for increased urination which he already struggles with.      Relevant Orders   Comp Met (CMET)     Hematopoietic and Hemostatic   Acquired thrombophilia (HCC)    Patient on Eliquis with A-fib, monitor CBC regularly.        Follow up plan: Return in about 3 months (around 04/05/2023) for T2DM, HTN/HLD, CKD, A-FIB.

## 2023-01-04 LAB — COMPREHENSIVE METABOLIC PANEL
ALT: 15 [IU]/L (ref 0–44)
AST: 17 [IU]/L (ref 0–40)
Albumin: 4 g/dL (ref 3.8–4.8)
Alkaline Phosphatase: 78 [IU]/L (ref 44–121)
BUN/Creatinine Ratio: 15 (ref 10–24)
BUN: 18 mg/dL (ref 8–27)
Bilirubin Total: 0.4 mg/dL (ref 0.0–1.2)
CO2: 23 mmol/L (ref 20–29)
Calcium: 9.4 mg/dL (ref 8.6–10.2)
Chloride: 99 mmol/L (ref 96–106)
Creatinine, Ser: 1.21 mg/dL (ref 0.76–1.27)
Globulin, Total: 2.4 g/dL (ref 1.5–4.5)
Glucose: 314 mg/dL — ABNORMAL HIGH (ref 70–99)
Potassium: 4.1 mmol/L (ref 3.5–5.2)
Sodium: 136 mmol/L (ref 134–144)
Total Protein: 6.4 g/dL (ref 6.0–8.5)
eGFR: 61 mL/min/{1.73_m2} (ref >59)

## 2023-01-04 LAB — LIPID PANEL W/O CHOL/HDL RATIO
Cholesterol, Total: 94 mg/dL — ABNORMAL LOW (ref 100–199)
HDL: 42 mg/dL (ref >39)
LDL Chol Calc (NIH): 35 mg/dL (ref 0–99)
Triglycerides: 86 mg/dL (ref 0–149)
VLDL Cholesterol Cal: 17 mg/dL (ref 5–40)

## 2023-01-04 NOTE — Telephone Encounter (Signed)
Please see note below concerning Amlodipine refill historical medication never has been filled by [facility] only PCP.

## 2023-01-04 NOTE — Telephone Encounter (Signed)
Called placed to the patient to verify the Amlodipine 5 mg. At his last appointment in August there was no record that the patient was on this medication. The patient stated that he has been on this medication for a long time and takes 5 mg at night.

## 2023-01-04 NOTE — Progress Notes (Signed)
Contacted via MyChart   Good afternoon Ausitn, your labs have returned: - Kidney function, creatinine and eGFR, remains normal, as is liver function, AST and ALT.  - Glucose is a bit elevated, but A1c as you know is at goal.  We will monitor closely. - Lipid panel shows LDL at goal.  Any questions? Keep being stellar!!  Thank you for allowing me to participate in your care.  I appreciate you. Kindest regards, Janssen Zee

## 2023-01-04 NOTE — Telephone Encounter (Signed)
Patient is calling to follow up on refill due to pharmacy not receiving it back. He states he only has a few tablets left.   Please advise.

## 2023-01-05 NOTE — Telephone Encounter (Signed)
last visit: 09/26/22 with plan to f/u in 6 months.  next visit:  none/active recall

## 2023-01-08 ENCOUNTER — Ambulatory Visit: Payer: Medicare Other

## 2023-01-10 ENCOUNTER — Other Ambulatory Visit: Payer: Medicare Other

## 2023-01-10 ENCOUNTER — Encounter: Payer: Medicare Other | Admitting: *Deleted

## 2023-01-15 ENCOUNTER — Ambulatory Visit: Payer: Self-pay | Admitting: *Deleted

## 2023-01-15 NOTE — Patient Outreach (Signed)
Care Coordination   Follow Up Visit Note   01/15/2023 Name: Sean Reyes. MRN: [id number] DOB: June 04, 1940  Sean Reyes. is a 82 y.o. year old male who sees [place], Corrie Dandy T, NP for primary care. I spoke with  Sean Reyes. by phone today.  What matters to the patients health and wellness today?  Most chronic conditions well managed, still working to improve back pain and neuropathy.  Denies any urgent concerns, encouraged to contact this care manager with questions.      Goals Addressed             This Visit's Progress    COMPLETED: RNCM Care Management  Expected Outcome:  Monitor, Self-Manage and Reduce Symptoms of Diabetes   On track    Current Barriers:  Knowledge Deficits related to how to effectively manage weakness and neuropathy causes by DM Care Coordination needs related to resources to help with managing neuropathy pain and weakness  in a patient with DM Chronic Disease Management support and education needs related to effective management of DM Lab Results  Component Value Date   HGBA1C 6.9 (H) 01/03/2023     Planned Interventions: Provided education to patient about basic DM disease process. 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 The patient states that he has seen the pain specialist and is considering other options but is having issues now with his AFIB.  Patient now going to outpatient PT 2x/weekly for 8 weeks, states he is improving with ambulation and stated he was able to walk today without AD. Patient completed completed Zio heart monitor trials and awaiting results. Reviewed medications with patient and discussed importance of medication adherence. The patient is compliant with medications;        Reviewed prescribed diet with patient heart healthy/ADA diet. Review of heart healthy/ADA diet. The patient says his swallowing may be a little better but not a lot.  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 The patient has regular lab work. Reviewed with the patient that his A1C is up from 6.5 to 7.0. Education on monitoring for acute changes and calling the pcp for recommendations.;        Discussed plans with patient for ongoing care management follow up and provided patient with direct contact information for care management team. Is appreciative of the CCM team calling and checking on him and helping him with his health and well being;      Provided patient with written educational materials related to hypo and hyperglycemia and importance of correct treatment;       Reviewed scheduled/upcoming provider appointments including: next pcp follow up 01-03-2023. Education provided.  Advised patient, providing education and rationale, to check cbg twice daily and when you have symptoms of low or high blood sugar and record. Denies any acute changes in his blood sugars.       call provider for findings outside established parameters;       Review of patient status, including review of consultants reports, relevant laboratory and other test results, and medications completed;       Advised patient to discuss changes in her DM health and well being with provider;      Screening for signs and symptoms of depression related  to chronic disease state;        Assessed social determinant of health barriers;         Symptom Management: Take medications as prescribed   Attend all scheduled provider appointments Call provider office for new concerns or questions  call the Suicide and Crisis Lifeline: 988 call the [place] National Suicide Prevention Lifeline: 714-812-4610 or TTY: 507-106-7516 TTY 516 701 2011) to talk to a trained counselor call 1-800-273-TALK (toll free, 24 hour hotline) if experiencing a Mental Health or  Behavioral Health Crisis  check feet daily for cuts, sores or redness trim toenails straight across manage portion size wash and dry feet carefully every day wear comfortable, cotton socks wear comfortable, well-fitting shoes  Follow Up Plan: Telephone follow up appointment with care management team member scheduled for: 01-10-2023 at 345 pm       COMPLETED: RNCM Care Management Expected Outcome:  Monitor, Self-Manage and Reduce Symptoms of: CKD   On track    Current Barriers:  Chronic Disease Management support and education needs related to effective management of CKD Lab Results  Component Value Date   CREATININE 1.21 01/03/2023   CREATININE 1.23 10/11/2022   CREATININE 1.18 10/03/2022    Lab Results  Component Value Date   CREATININE 1.21 01/03/2023   BUN 18 01/03/2023   NA 136 01/03/2023   K 4.1 01/03/2023   CL 99 01/03/2023   CO2 23 01/03/2023     Planned Interventions: Assessed the patient understanding of chronic kidney disease. Denies any acute changes in his CKD or kidney function. States that his kidney function is good.   The patient has stable kidney function at this time. Evaluation of current treatment plan related to chronic kidney disease self management and patient's adherence to plan as established by provider.       Provided education to patient re: stroke prevention, s/s of heart attack and stroke    Reviewed prescribed diet heart healthy/ADA diet. The patient is compliant with dietary restrictions. The patient states he is doing well and watching what he eats. Reviewed medications with patient and discussed importance of compliance. The patient is compliant with medications. Patient states that he has frequent cramping and it was noted that his magnesium supplement was not to be taken with another medication. CM offered Pharm D consult but patient declined.    Advised patient, providing education and rationale, to monitor blood pressure daily and record,  calling PCP for findings outside established parameters    Discussed complications of poorly controlled blood pressure such as heart disease, stroke, circulatory complications, vision complications, kidney impairment, sexual dysfunction    Reviewed scheduled/upcoming provider appointments including: 01-03-2023. Advised patient to discuss changes in his kidney function, or questions and concerns related to CKD with provider    Discussed plans with patient for ongoing care management follow up and provided patient with direct contact information for care management team    Screening for signs and symptoms of depression related to chronic disease state      Discussed the impact of chronic kidney disease on daily life and mental health and acknowledged and normalized feelings of disempowerment, fear, and frustration    Assessed social determinant of health barriers    Provided education on kidney disease progression    Engage patient in early, proactive and ongoing discussion about goals of care and what matters most to them    Support coping and stress management by recognizing current strategies and assist in developing new strategies such as mindfulness,  journaling, relaxation techniques, problem-solving. The patient is still dealing with ongoing chronic back pain and leg weakness. Stated he was using a heating pad today, reminded him of safe practices when using heating pad, verbalized full understanding. Currently participating in PT treatments 2x/wk.  Denies falls.    Take medications as prescribed   Attend all scheduled provider appointments Call provider office for new concerns or questions  call the Suicide and Crisis Lifeline: 988 call the [place] National Suicide Prevention Lifeline: 904-357-9695 or TTY: 743-513-2767 TTY (509) 536-5937) to talk to a trained counselor call 1-800-273-TALK (toll free, 24 hour hotline) if experiencing a Mental Health or Behavioral Health Crisis   Follow Up Plan:  Telephone follow up appointment with care management team member scheduled for: 01-10-2023 at 345 pm       COMPLETED: RNCM Care Management Expected Outcome:  Monitor, Self-Manage, and Reduce Symptoms of Hypertension   On track    Current Barriers:  Knowledge Deficits related to fluctuations in blood pressures and the need to have normalized blood pressures to prevent the risk of heart attack and stroke Chronic Disease Management support and education needs related to effective management of HTN BP Readings from Last 3 Encounters:  01/03/23 113/64  11/13/22 (!) 149/68  11/06/22 131/64     Planned Interventions: Evaluation of current treatment plan related to hypertension self management and patient's adherence to plan as established by provider. The patient saw the cardiologist recently. Completed Zio heart monitor and is awaiting results. Reflective listening and support given.  Provided education to patient re: stroke prevention, s/s of heart attack and stroke; Reviewed prescribed diet heart healthy/ADA diet. The patient is compliant with dietary restrictions.  Reviewed medications with patient and discussed importance of compliance. The patient states compliance with medications.  Discussed plans with patient for ongoing care management follow up and provided patient with direct contact information for care management team; Advised patient, providing education and rationale, to monitor blood pressure daily and record, calling PCP for findings outside established parameters.   Reviewed scheduled/upcoming provider appointments including:  next pcp appointment on 01-03-2023 at 1020 am. Advised patient to discuss changes in his HTN, or heart health  with provider; Provided education on prescribed diet Heart healthy/ADA diet ;  Discussed complications of poorly controlled blood pressure such as heart disease, stroke, circulatory complications, vision complications, kidney impairment, sexual  dysfunction;  Screening for signs and symptoms of depression related to chronic disease state;  Assessed social determinant of health barriers;  The patient is having a lot of different things going on . Is receiving iron infusions and is going to see specialist. Patient will follow up with cardiology following Zio heart monitor.  Symptom Management: Take medications as prescribed   Attend all scheduled provider appointments Call provider office for new concerns or questions  call the Suicide and Crisis Lifeline: 988 call the [place] National Suicide Prevention Lifeline: (229)380-2219 or TTY: (662) 838-6374 TTY 315-091-7232) to talk to a trained counselor call 1-800-273-TALK (toll free, 24 hour hotline) if experiencing a Mental Health or Behavioral Health Crisis  check blood pressure 3 times per week learn about high blood pressure keep a blood pressure log take blood pressure log to all doctor appointments call doctor for signs and symptoms of high blood pressure develop an action plan for high blood pressure keep all doctor appointments take medications for blood pressure exactly as prescribed report new symptoms to your doctor  Follow Up Plan: Telephone follow up appointment with care management team member scheduled  for: 01-10-2023 at 345 pm          SDOH assessments and interventions completed:  No     Care Coordination Interventions:  Yes, provided   Interventions Today    Flowsheet Row Most Recent Value  Chronic Disease   Chronic disease during today's visit Chronic Kidney Disease/End Stage Renal Disease (ESRD), Hypertension (HTN), Diabetes, Other  [chronic back pain and neuropathy]  General Interventions   General Interventions Discussed/Reviewed General Interventions Reviewed, Doctor Visits, Labs  [has nerve stimulator, will call to see if he can get the settings adjusted to better control pain.  0/10 when sitting or relaxing, 5/10 with movement/walking]  Labs Hgb A1c  every 3 months  [curently 6.9]  Doctor Visits Discussed/Reviewed Doctor Visits Reviewed, PCP, Specialist  [upcoming with neurology on 12/9 and PCP on 2/20]  PCP/Specialist Visits Compliance with follow-up visit  Education Interventions   Education Provided Provided Education  Provided Verbal Education On Labs, Blood Sugar Monitoring, When to see the doctor, Other, Medication  [blood sugar range 140-170s, BP range 120-140s/60-70s.]  Labs Reviewed Hgb A1c  [currently 6.9, at goal which is less than 7]  Safety Interventions   Safety Discussed/Reviewed Fall Risk  [uses cane in the house and crutch when walking outside.  Also has rollator he uses sometimes]       Follow up plan: Follow up call scheduled for 3/4    Encounter Outcome:  Patient Visit Completed   Rodney Langton, RN, MSN, CCM [place]  [facility], [facility] RN Care Coordinator Direct Dial: [phone] / Main [phone] Fax [phone] Email: [email or web address] Website: [email or web address]

## 2023-01-19 ENCOUNTER — Encounter: Payer: Self-pay | Admitting: Internal Medicine

## 2023-01-19 NOTE — Progress Notes (Signed)
[facility] [address] [place], [place] [address]  Pulmonary Sleep Medicine   Office Visit Note  Patient Name: Sean Reyes. DOB: [birth year] MRN [id number]    Chief Complaint: Obstructive Sleep Apnea visit  Brief History:  Sean Reyes is seen today for an annual follow up visit for APAP@ 5-13 cmH2O.  The patient has a 14 year history of sleep apnea. Patient is using PAP nightly.  The patient feels rested after sleeping with PAP.  The patient reports benefiting from PAP use. Reported sleepiness is improved and the Epworth Sleepiness Score is 9 out of 24. The patient will occasionally take naps. The patient complains of the following: none.  The compliance download shows 83% compliance with an average use time of 5 hours 46 minutes. The AHI is 7.7.  The patient does not complain of limb movements disrupting sleep. The patient continues to require PAP therapy in order to eliminate sleep apnea.   ROS  General: (-) fever, (-) chills, (-) night sweat Nose and Sinuses: (-) nasal stuffiness or itchiness, (-) postnasal drip, (-) nosebleeds, (-) sinus trouble. Mouth and Throat: (-) sore throat, (-) hoarseness. Neck: (-) swollen glands, (-) enlarged thyroid, (-) neck pain. Respiratory: + cough, + shortness of breath, - wheezing. Neurologic: +numbness, - tingling. Psychiatric: - anxiety, - depression   Current Medication: Outpatient Encounter Medications as of 01/22/2023  Medication Sig   amLODipine (NORVASC) 5 MG tablet Take 1 tablet (5 mg total) by mouth at bedtime.   ANORO ELLIPTA 62.5-25 MCG/ACT AEPB Inhale 1 puff into the lungs daily.   benazepril (LOTENSIN) 20 MG tablet Take 1 tablet (20 mg total) by mouth daily.   cyanocobalamin 1000 MCG tablet Take by mouth.   ELIQUIS 5 MG TABS tablet Take 1 tablet (5 mg total) by mouth 2 (two) times daily.   ferrous sulfate 325 (65 FE) MG EC tablet Take 325 mg by mouth daily with breakfast.   finasteride (PROSCAR) 5 MG tablet Take 1  tablet (5 mg total) by mouth daily.   metFORMIN (GLUCOPHAGE) 500 MG tablet Take 1 tablet (500 mg total) by mouth 2 (two) times daily with a meal.   omeprazole (PRILOSEC) 40 MG capsule Take 1 capsule (40 mg total) by mouth in the morning and at bedtime.   prednisoLONE acetate (PRED FORTE) 1 % ophthalmic suspension Place 1 drop into both eyes daily.   rosuvastatin (CRESTOR) 5 MG tablet Take 1 tablet (5 mg total) by mouth daily.   solifenacin (VESICARE) 5 MG tablet Take 1 tablet (5 mg total) by mouth daily.   tamsulosin (FLOMAX) 0.4 MG CAPS capsule Take 2 capsules (0.8 mg total) by mouth daily after supper.   No facility-administered encounter medications on file as of 01/22/2023.    Surgical History: Past Surgical History:  Procedure Laterality Date   ABLATION     ANTERIOR LAT LUMBAR FUSION N/A 06/27/2017   Procedure: Anterior Lateral Lumbar Interbody  Fusion - Lumbar Two-Lumbar Three - Lumbar Three-Lumbar Four, Posterior Lumbar Interbody Fusion Lumbar Four- Five;  Surgeon: Donalee Citrin, MD;  Location: [facility] OR;  Service: Neurosurgery;  Laterality: N/A;  Anterior Lateral Lumbar Interbody  Fusion - Lumbar Two-Lumbar Three - Lumbar Three-Lumbar Four, Posterior Lumbar Interbody Fusion Lumbar Four- Five   BACK SURGERY     CARDIAC CATHETERIZATION     CARDIOVERSION N/A 08/29/2018   Procedure: CARDIOVERSION;  Surgeon: Lamar Blinks, MD;  Location: [facility] ORS;  Service: Cardiovascular;  Laterality: N/A;   CARDIOVERSION N/A 09/24/2018  Procedure: CARDIOVERSION;  Surgeon: Lamar Blinks, MD;  Location: [facility] ORS;  Service: Cardiovascular;  Laterality: N/A;   COLONOSCOPY WITH PROPOFOL N/A 10/05/2015   Procedure: COLONOSCOPY WITH PROPOFOL;  Surgeon: Christena Deem, MD;  Location: [facility] ENDOSCOPY;  Service: Endoscopy;  Laterality: N/A;   COLONOSCOPY WITH PROPOFOL N/A 11/01/2020   Procedure: COLONOSCOPY WITH PROPOFOL;  Surgeon: Wyline Mood, MD;  Location: [facility] ENDOSCOPY;  Service: Gastroenterology;   Laterality: N/A;   CORONARY STENT INTERVENTION N/A 12/22/2020   Procedure: CORONARY STENT INTERVENTION;  Surgeon: Marcina Millard, MD;  Location: [facility] INVASIVE CV LAB;  Service: Cardiovascular;  Laterality: N/A;   ESOPHAGOGASTRODUODENOSCOPY N/A 11/01/2020   Procedure: ESOPHAGOGASTRODUODENOSCOPY (EGD);  Surgeon: Wyline Mood, MD;  Location: [facility] ENDOSCOPY;  Service: Gastroenterology;  Laterality: N/A;   ESOPHAGOGASTRODUODENOSCOPY (EGD) WITH PROPOFOL N/A 04/01/2018   Procedure: ESOPHAGOGASTRODUODENOSCOPY (EGD) WITH PROPOFOL;  Surgeon: Christena Deem, MD;  Location: [facility] ENDOSCOPY;  Service: Endoscopy;  Laterality: N/A;   ESOPHAGOGASTRODUODENOSCOPY (EGD) WITH PROPOFOL N/A 06/01/2022   Procedure: ESOPHAGOGASTRODUODENOSCOPY (EGD) WITH PROPOFOL;  Surgeon: Wyline Mood, MD;  Location: [facility] ENDOSCOPY;  Service: Gastroenterology;  Laterality: N/A;   EYE SURGERY     HERNIA REPAIR     JOINT REPLACEMENT Bilateral    hips  RT+  LEFT X2    LEFT HEART CATH AND CORONARY ANGIOGRAPHY N/A 12/22/2020   Procedure: LEFT HEART CATH AND CORONARY ANGIOGRAPHY;  Surgeon: Marcina Millard, MD;  Location: [facility] INVASIVE CV LAB;  Service: Cardiovascular;  Laterality: N/A;   LEFT HEART CATH AND CORONARY ANGIOGRAPHY Left 08/23/2021   Procedure: LEFT HEART CATH AND CORONARY ANGIOGRAPHY;  Surgeon: Lamar Blinks, MD;  Location: [facility] INVASIVE CV LAB;  Service: Cardiovascular;  Laterality: Left;   LUMBAR LAMINECTOMY/DECOMPRESSION MICRODISCECTOMY Left 09/13/2016   Procedure: Microdiscectomy - Lumbar two-three,  Lumbar three- - left;  Surgeon: Donalee Citrin, MD;  Location: [facility] OR;  Service: Neurosurgery;  Laterality: Left;   SPINAL CORD STIMULATOR INSERTION  07/08/2019   TONSILLECTOMY      Medical History: Past Medical History:  Diagnosis Date   Anemia    Anxiety    Arthritis    Arthritis of neck    Atrial fibrillation (HCC)    Cataracts, bilateral    Complication of anesthesia    pt reports low BP's after  surgery at [facility] and difficulty awakening   Depression    Diabetes (HCC)    dx 6-8 yrs ago   Dysrhythmia    a-fib   GERD (gastroesophageal reflux disease)    OCC TAKES ALKA SELTZER   History of kidney stones    10-15 yrs ago   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   Hyperlipidemia    Hypertension    Myocardial infarction (HCC) 12/2020   Nocturia    S/P ablation of atrial fibrillation    Ablative therapy   Sleep apnea    CPAP    Spinal stenosis    Tachycardia, unspecified     Family History: Non contributory to the present illness  Social History: Social History   Socioeconomic History   Marital status: Married    Spouse name: Elnita Maxwell    Number of children: 2   Years of education: Not on file   Highest education level: High school graduate  Occupational History   Occupation: retired   Tobacco Use   Smoking status: Never   Smokeless tobacco: Never  Vaping Use   Vaping status: Never Used  Substance and Sexual Activity   Alcohol  use: No   Drug use: No   Sexual activity: Not on file  Other Topics Concern   Not on file  Social History Narrative   MarriedGets regular exercise.      Lives in [place] with wife/ daughter. Never smoked; no alcohol. 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    Social Determinants of Health   Financial Resource Strain: Low Risk  (09/19/2022)   Overall Financial Resource Strain (CARDIA)    Difficulty of Paying Living Expenses: Not hard at all  Food Insecurity: No Food Insecurity (09/19/2022)   Hunger Vital Sign    Worried About Running Out of Food in the Last Year: Never true    Ran Out of Food in the Last Year: Never true  Transportation Needs: No Transportation Needs (09/19/2022)   PRAPARE - Administrator, Civil Service (Medical): No    Lack of Transportation (Non-Medical): No  Physical Activity: Insufficiently Active (11/23/2022)   Exercise Vital Sign    Days of  Exercise per Week: 2 days    Minutes of Exercise per Session: 30 min  Stress: No Stress Concern Present (09/19/2022)   [employer] of Occupational Health - Occupational Stress Questionnaire    Feeling of Stress : Not at all  Social Connections: Socially Integrated (09/19/2022)   Social Connection and Isolation Panel [NHANES]    Frequency of Communication with Friends and Family: More than three times a week    Frequency of Social Gatherings with Friends and Family: Twice a week    Attends Religious Services: More than 4 times per year    Active Member of [employer] or Organizations: Yes    Attends Engineer, structural: More than 4 times per year    Marital Status: Married  Catering manager Violence: Not At Risk (09/19/2022)   Humiliation, Afraid, Rape, and Kick questionnaire    Fear of Current or Ex-Partner: No    Emotionally Abused: No    Physically Abused: No    Sexually Abused: No    Vital Signs: Blood pressure (!) 146/96, pulse 68, resp. rate 18, height 6\' 1"  (1.854 m), weight 183 lb (83 kg), SpO2 98%. Body mass index is 24.14 kg/m.    Examination: General Appearance: The patient is well-developed, well-nourished, and in no distress. Neck Circumference: 41 cm Skin: Gross inspection of skin unremarkable. Head: normocephalic, no gross deformities. Eyes: no gross deformities noted. ENT: ears appear grossly normal Neurologic: Alert and oriented. No involuntary movements.  STOP BANG RISK ASSESSMENT S (snore) Have you been told that you snore?     NO   T (tired) Are you often tired, fatigued, or sleepy during the day?   NO  O (obstruction) Do you stop breathing, choke, or gasp during sleep? NO   P (pressure) Do you have or are you being treated for high blood pressure? YES   B (BMI) Is your body index greater than 35 kg/m? NO   A (age) Are you 10 years old or older? YES   N (neck) Do you have a neck circumference greater than 16 inches?   NO   G (gender) Are you  a male? YES   TOTAL STOP/BANG "YES" ANSWERS 3       A STOP-Bang score of 2 or less is considered low risk, and a score of 5 or more is high risk for having either moderate or severe OSA. For people who score 3 or 4, doctors may need to perform further  assessment to determine how likely they are to have OSA.         EPWORTH SLEEPINESS SCALE:  Scale:  (0)= no chance of dozing; (1)= slight chance of dozing; (2)= moderate chance of dozing; (3)= high chance of dozing  Chance  Situtation    Sitting and reading: 2    Watching TV: 1    Sitting Inactive in public: 0    As a passenger in car: 2      Lying down to rest: 3    Sitting and talking: 0    Sitting quielty after lunch: 1    In a car, stopped in traffic: 0   TOTAL SCORE:   9 out of 24    SLEEP STUDIES:  HST (04/2021) AHI 14/hr, min SpO2 83% Titration (04/2008) APAP@ 5-20 cmH2O   CPAP COMPLIANCE DATA:  Date Range: 01/19/2022-01/18/2023  Average Daily Use: 5 hours 46 minutes  Median Use: 6 hours 9 minutes  Compliance for > 4 Hours: 83%   AHI: 7.7 respiratory events per hour  Days Used: 356/365 days  Mask Leak: 6  95th Percentile Pressure: 12.2         LABS: Recent Results (from the past 2160 hour(s))  Bayer DCA Hb A1c Waived (STAT)     Status: Abnormal   Collection Time: 01/03/23 10:38 AM  Result Value Ref Range   HB A1C (BAYER DCA - WAIVED) 6.9 (H) 4.8 - 5.6 %    Comment:          Prediabetes: 5.7 - 6.4          Diabetes: >6.4          Glycemic control for adults with diabetes: <7.0   Comp Met (CMET)     Status: Abnormal   Collection Time: 01/03/23 10:40 AM  Result Value Ref Range   Glucose 314 (H) 70 - 99 mg/dL   BUN 18 8 - 27 mg/dL   Creatinine, Ser 2.37 0.76 - 1.27 mg/dL   eGFR 61 >62 GB/TDV/7.61   BUN/Creatinine Ratio 15 10 - 24   Sodium 136 134 - 144 mmol/L   Potassium 4.1 3.5 - 5.2 mmol/L   Chloride 99 96 - 106 mmol/L   CO2 23 20 - 29 mmol/L   Calcium 9.4 8.6 - 10.2 mg/dL    Total Protein 6.4 6.0 - 8.5 g/dL   Albumin 4.0 3.8 - 4.8 g/dL   Globulin, Total 2.4 1.5 - 4.5 g/dL   Bilirubin Total 0.4 0.0 - 1.2 mg/dL   Alkaline Phosphatase 78 44 - 121 IU/L   AST 17 0 - 40 IU/L   ALT 15 0 - 44 IU/L  Lipid Panel w/o Chol/HDL Ratio     Status: Abnormal   Collection Time: 01/03/23 10:40 AM  Result Value Ref Range   Cholesterol, Total 94 (L) 100 - 199 mg/dL   Triglycerides 86 0 - 149 mg/dL   HDL 42 >60 mg/dL   VLDL Cholesterol Cal 17 5 - 40 mg/dL   LDL Chol Calc (NIH) 35 0 - 99 mg/dL    Radiology: MM 3D DIAGNOSTIC MAMMOGRAM BILATERAL BREAST  Result Date: 10/04/2022 CLINICAL DATA:  Palpable painful area in the LEFT retroareolar breast. EXAM: DIGITAL DIAGNOSTIC BILATERAL MAMMOGRAM WITH TOMOSYNTHESIS AND CAD TECHNIQUE: Bilateral digital diagnostic mammography and breast tomosynthesis was performed. The images were evaluated with computer-aided detection. COMPARISON:  CT chest dated November 11, 2021 ACR Breast Density Category b: There are scattered areas of fibroglandular density. FINDINGS: Spot compression  tomosynthesis views were obtained of the site of palpable concern in the LEFT retroareolar breast. There is a flame shaped focal asymmetry subjacent to the site of palpable concern. No suspicious mass, distortion, or microcalcifications are identified to suggest presence of malignancy bilaterally. IMPRESSION: 1. There is benign gynecomastia at the site of painful/palpable concern. No mammographic evidence of malignancy bilaterally. 2. Gynecomastia is common and can occur with changes in the testosterone:estrogen ratio. Potential causes of gynecomastia include numerous prescription medications, dietary supplements, anabolic steroids, and recreational drugs, particularly marijuana. Other causes include hormone secreting testicular or pituitary tumors. Gynecomastia can be related to other medical problems, such as kidney, thyroid, or liver disease. Gynecomastia often resolves on  its own. RECOMMENDATION: No dedicated imaging follow-up is recommended for this benign finding. I have discussed the findings and recommendations with the patient. If applicable, a reminder letter will be sent to the patient regarding the next appointment. BI-RADS CATEGORY  2: Benign. Electronically Signed   By: Meda Klinefelter M.D.   On: 10/04/2022 11:32    No results found.  No results found.    Assessment and Plan: Patient Active Problem List   Diagnosis Date Noted   Finger numbness 10/13/2022   Subareolar mass of left breast 10/03/2022   COPD (chronic obstructive pulmonary disease) (HCC) 10/31/2021   Shortness of breath on exertion 08/04/2021   Diabetes mellitus with proteinuria (HCC) 04/04/2021   GERD without esophagitis 04/02/2021   Coronary artery disease 12/28/2020   History of non-ST elevation myocardial infarction (NSTEMI) 12/21/2020   Pain in right shin 10/25/2020   Peripheral vascular disease (HCC) 08/06/2020   Chronic pain syndrome 07/06/2020   Cervical facet joint syndrome 04/08/2020   Spinal cord stimulator status 12/11/2019   Elevated TSH 08/20/2019   CKD (chronic kidney disease) stage 3, GFR 30-59 ml/min (HCC) 01/19/2019   Acquired thrombophilia (HCC) 01/19/2019   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   Postlaminectomy syndrome, lumbar region 01/16/2019   History of fusion of lumbar spine (L2-L5) 01/16/2019   Chronic radicular lumbar pain 01/16/2019   HNP (herniated nucleus pulposus), lumbar 04/29/2018   Advanced care planning/counseling discussion 09/28/2016   Spinal stenosis, lumbar region, 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 OSA (obstructive sleep apnea) The patient does tolerate PAP and reports  benefit from PAP use. His apnea is not optimally controlled. He was well controlled in the past on APAP 5-15 but could not tolerate it due to mask leak. We have improved the mask leak so he is willing to increase the top end pressure to 14 cm. We will do a 2 week download. The patient was reminded how to clean equipment and advised to replace supplies routinely. The compliance is good. The AHI is 7.7.   OSA- suboptimal control. Increase to 5-14 cm. 2 week download. F/u one year.   2. CPAP use counseling CPAP Counseling: had a lengthy discussion with the patient regarding the importance of PAP therapy in management of the sleep apnea. Patient appears to understand the risk factor reduction and also understands the risks associated with untreated sleep apnea. Patient will try to  make a good faith effort to remain compliant with therapy. Also instructed the patient on proper cleaning of the device including the water must be changed daily if possible and use of distilled water is preferred. Patient understands that the machine should be regularly cleaned with appropriate recommended cleaning solutions that do not damage the PAP machine for example given white vinegar and water rinses. Other methods such as ozone treatment may not be as good as these simple methods to achieve cleaning.   3. AF (paroxysmal atrial fibrillation) (HCC) Stable. ON eliquis. Continue.     General Counseling: I have discussed the findings of the evaluation and examination with Sean Reyes.  I have also discussed any further diagnostic evaluation thatmay be needed or ordered today. Sean Reyes verbalizes understanding of the findings of todays visit. We also reviewed his medications today and discussed drug interactions and side effects  including but not limited excessive drowsiness and altered mental states. We also discussed that there is always a risk not just to him but also people around him. he has been encouraged to call the office with any questions or concerns that should arise related to todays visit.  No orders of the defined types were placed in this encounter.       I have personally obtained a history, examined the patient, evaluated laboratory and imaging results, formulated the assessment and plan and placed orders. This patient was seen today by Emmaline Kluver, PA-C in collaboration with Dr. Freda Munro.   Yevonne Pax, MD [facility] Diplomate ABMS Pulmonary Critical Care Medicine and Sleep Medicine

## 2023-01-22 ENCOUNTER — Ambulatory Visit (INDEPENDENT_AMBULATORY_CARE_PROVIDER_SITE_OTHER): Payer: Medicare Other | Admitting: Internal Medicine

## 2023-01-22 VITALS — BP 146/96 | HR 68 | Resp 18 | Ht 73.0 in | Wt 183.0 lb

## 2023-01-22 DIAGNOSIS — G4733 Obstructive sleep apnea (adult) (pediatric): Secondary | ICD-10-CM

## 2023-01-22 DIAGNOSIS — I480 Paroxysmal atrial fibrillation: Secondary | ICD-10-CM

## 2023-01-22 DIAGNOSIS — Z7189 Other specified counseling: Secondary | ICD-10-CM

## 2023-01-22 DIAGNOSIS — R531 Weakness: Secondary | ICD-10-CM | POA: Diagnosis not present

## 2023-01-22 DIAGNOSIS — R200 Anesthesia of skin: Secondary | ICD-10-CM | POA: Diagnosis not present

## 2023-01-22 NOTE — Patient Instructions (Signed)

## 2023-02-22 ENCOUNTER — Other Ambulatory Visit: Payer: Self-pay | Admitting: Gastroenterology

## 2023-03-02 ENCOUNTER — Other Ambulatory Visit: Payer: Self-pay | Admitting: Gastroenterology

## 2023-03-27 ENCOUNTER — Ambulatory Visit: Payer: Medicare Other | Attending: Cardiology | Admitting: Cardiology

## 2023-03-27 ENCOUNTER — Encounter: Payer: Self-pay | Admitting: Cardiology

## 2023-03-27 VITALS — BP 160/80 | HR 74 | Ht 73.0 in | Wt 186.4 lb

## 2023-03-27 DIAGNOSIS — I2510 Atherosclerotic heart disease of native coronary artery without angina pectoris: Secondary | ICD-10-CM | POA: Insufficient documentation

## 2023-03-27 DIAGNOSIS — J449 Chronic obstructive pulmonary disease, unspecified: Secondary | ICD-10-CM | POA: Diagnosis not present

## 2023-03-27 DIAGNOSIS — E785 Hyperlipidemia, unspecified: Secondary | ICD-10-CM | POA: Diagnosis not present

## 2023-03-27 DIAGNOSIS — R0609 Other forms of dyspnea: Secondary | ICD-10-CM | POA: Insufficient documentation

## 2023-03-27 DIAGNOSIS — I480 Paroxysmal atrial fibrillation: Secondary | ICD-10-CM | POA: Insufficient documentation

## 2023-03-27 DIAGNOSIS — G4733 Obstructive sleep apnea (adult) (pediatric): Secondary | ICD-10-CM | POA: Insufficient documentation

## 2023-03-27 DIAGNOSIS — I10 Essential (primary) hypertension: Secondary | ICD-10-CM | POA: Insufficient documentation

## 2023-03-27 MED ORDER — BENAZEPRIL HCL 20 MG PO TABS: 30.0000 mg | ORAL_TABLET | Freq: Every day | ORAL | 1 refills | Status: DC

## 2023-03-27 NOTE — Patient Instructions (Signed)
Medication Instructions:  INCREASE the Benazepril to 30 mg once daily (a tablet and a half)  *If you need a refill on your cardiac medications before your next appointment, please call your pharmacy*   Lab Work: None ordered If you have labs (blood work) drawn today and your tests are completely normal, you will receive your results only by: MyChart Message (if you have MyChart) OR A paper copy in the mail If you have any lab test that is abnormal or we need to change your treatment, we will call you to review the results.   Testing/Procedures: None ordered   Follow-Up: At [facility], you and your health needs are our priority.  As part of our continuing mission to provide you with exceptional heart care, we have created designated Provider Care Teams.  These Care Teams include your primary Cardiologist (physician) and Advanced Practice Providers (APPs -  Physician Assistants and Nurse Practitioners) who all work together to provide you with the care you need, when you need it.  We recommend signing up for the patient portal called "MyChart".  Sign up information is provided on this After Visit Summary.  MyChart is used to connect with patients for Virtual Visits (Telemedicine).  Patients are able to view lab/test results, encounter notes, upcoming appointments, etc.  Non-urgent messages can be sent to your provider as well.   To learn more about what you can do with MyChart, go to [email or web address].    Your next appointment:   6 week(s)  Provider:   You may see Lorine Bears, MD or one of the following Advanced Practice Providers on your designated Care Team:   Charlsie Quest, NP

## 2023-03-27 NOTE — Progress Notes (Signed)
Cardiology Office Note:  .   Date:  03/27/2023  ID:  Sean Reyes., DOB 06-17-1940, MRN [id number] PCP: Marjie Skiff, NP  [facility] Providers Cardiologist:  Lorine Bears, MD    History of Present Illness: .   Sean Reyes. is a 83 y.o. male with past medical history of paroxysmal atrial fibrillation, coronary artery disease, abnormal EKG with PVCs, essential hypertension, hyperlipidemia, type 2 diabetes, chronic kidney disease, who is here today to follow-up on his paroxysmal atrial fibrillation and coronary artery disease.  Cardiac catheterization 11/22 severe proximal RCA stenosis with successful PCI-DES. Repeat cardiac catheterization showed patent RCA stent with no significant in-stent restenosis. He had undergone ablation procedure at [facility] several years ago and reports having complications during the procedure. He subsequently had the ablation done at [facility] at a later date. He was originally placed on propafenone which was later changed to metoprolol after his myocardial infarction there was subsequently discontinued due to bradycardia. Echocardiogram 09/07/2021 revealed LVEF of 60 to 60%, no regional wall motion abnormalities, G1 DD, trivial MR. ZIO monitor revealed predominantly underlying rhythm of sinus with first-degree AV block, 1 run of ventricular tachycardia lasting 7 beats for total of 96 supraventricular tachycardia runs. He was restarted on beta-blocker therapy.  Unfortunately he then had had a symptomatic bradycardia and a beta-blocker therapy had to be stopped.   He was last seen in clinic 09/26/2022 by Dr.Arida.  At that time he continued to have symptomatic bradycardia with heart rates in the 40s to balance/congestion.  So metoprolol was discontinued.  Since that time he did have more palpitations especially with activity.  He denied any chest pain but is limited by chronic exertional dyspnea and COPD as well as low back pain with activity.  Symptomatic bradycardia  improved after stopping his metoprolol but he continued to have frequent PACs.  There were no other medication changes that were made and no further testing that was ordered.  He returns to clinic today stating that he continues to have chronic shortness of breath and fatigue that is unchanged.  He denies any chest pain or palpitations.  Continues to suffer from neck pain and arthritis as well as neuropathy.  States that he has been compliant with his current medication regimen without adverse side effects.  Has been compliant with his apixaban without any missed doses and denies any bleeding or any blood noted in his stool or urine.  He states that he did fall in December but did not ensure his hip replacement just had a large quantity of bruising-he continues to walk with a cane.  He has noted over the last several months that his blood pressure has been slightly elevated and continues to keep a log at home.  He denies any hospitalizations or recent emergency department visits.  ROS: 10 point review of systems has been reviewed and considered negative except ones been listed in HPI  Studies Reviewed: [name]   EKG Interpretation Date/Time:  Tuesday March 27 2023 11:03:08 EST Ventricular Rate:  74 PR Interval:  222 QRS Duration:  90 QT Interval:  392 QTC Calculation: 435 R Axis:   20  Text Interpretation: Sinus rhythm with 1st degree A-V block Cannot rule out Anterior infarct , age undetermined When compared with ECG of 26-Sep-2022 15:40, No significant change was found Confirmed by Charlsie Quest ([id number]) on 03/27/2023 11:07:26 AM    Event Monitor (Zio) 10/20/2022 Patient had a min HR of 34 bpm, max HR of 171 bpm,  and avg HR of 69 bpm. Predominant underlying rhythm was Sinus Rhythm.  160 Supraventricular Tachycardia runs occurred, the run with the fastest interval lasting 8 beats with a max rate of 171 bpm, the longest lasting 14.1 secs with an avg rate of 99 bpm.  Frequent PACs with a burden of  20% Rare PVCs. Most triggered events correlated with PACs. Risk Assessment/Calculations:    CHA2DS2-VASc Score = 4   This indicates a 4.8% annual risk of stroke. The patient's score is based upon: CHF History: 0 HTN History: 1 Diabetes History: 1 Stroke History: 0 Vascular Disease History: 0 Age Score: 2 Gender Score: 0    HYPERTENSION CONTROL Vitals:   03/27/23 1054 03/27/23 1115  BP: (!) 152/78 (!) 160/80    The patient's blood pressure is elevated above target today.  In order to address the patient's elevated BP: A current anti-hypertensive medication was adjusted today.          Physical Exam:   VS:  BP (!) 160/80 (BP Location: Left Arm, Patient Position: Sitting, Cuff Size: Normal)   Pulse 74   Ht 6\' 1"  (1.854 m)   Wt 186 lb 6.4 oz (84.6 kg)   SpO2 99%   BMI 24.59 kg/m    Wt Readings from Last 3 Encounters:  03/27/23 186 lb 6.4 oz (84.6 kg)  01/22/23 183 lb (83 kg)  01/03/23 183 lb 6.4 oz (83.2 kg)    GEN: Well nourished, well developed in no acute distress NECK: No JVD; No carotid bruits CARDIAC: RRR, no murmurs, rubs, gallops RESPIRATORY:  Clear to auscultation without rales, wheezing or rhonchi  ABDOMEN: Soft, non-tender, non-distended EXTREMITIES:  No edema; No deformity   ASSESSMENT AND PLAN: .   Coronary artery disease involving the native coronary arteries without angina.  Status post myocardial infarction stent placement to the right coronary artery November 2022.  He did have borderline disease was noted in the distal LAD but the vessel was too small for stent placement.  He is not on antiplatelet medication given that he is on apixaban.  He is continued on rosuvastatin 5 mg daily.  EKG today reveals sinus rhythm with first-degree AV block with a rate of 74 with no acute change from prior studies.  Paroxysmal atrial fibrillation rate is currently maintaining sinus rhythm.  He is continued for CHA2DS2-VASc 4 for stroke prophylaxis.  He is no longer on  beta-blocker therapy bradycardia in the past.  Essential hypertension with blood pressure today 152/78 on recheck of 160/80.  States over the last several months her blood pressure has been elevated as he continues to monitor at home.  His benazepril was been increased to 30 mg daily and he has been encouraged to monitor his pressures 1 to 2 hours postmedication administration as well.  He has also been continued on amlodipine 5 mg at bedtime.  He states that he also has a follow-up appointment that is upcoming on the 20th with his PCP for labs to reevaluate kidney function and electrolytes at that time.  Hyperlipidemia with most recent LDL 35.  He has been continued on rosuvastatin 5 mg daily.  Chronic dyspnea on exertion with COPD Gold stage II COPD with shortness of breath is been stable and unchanged.  Continues to increase his activity as tolerated but after his recent fall in December has been difficult.  He cannot reach to continue with increasing activity as tolerated.  Obstructive sleep apnea where he has been compliant with CPAP.  Type 2  diabetes where he is continued on metformin 500 mg twice daily.  This continues to be managed by his PCP.       Dispo: Patient return to clinic to see MDAPP in 6 weeks or sooner if needed for further evaluation of symptoms.  Signed, Benjiman Sedgwick, NP

## 2023-04-01 NOTE — Patient Instructions (Signed)
 Be Involved in Caring For Your Health:  Taking Medications When medications are taken as directed, they can greatly improve your health. But if they are not taken as prescribed, they may not work. In some cases, not taking them correctly can be harmful. To help ensure your treatment remains effective and safe, understand your medications and how to take them. Bring your medications to each visit for review by your provider.  Your lab results, notes, and after visit summary will be available on My Chart. We strongly encourage you to use this feature. If lab results are abnormal the clinic will contact you with the appropriate steps. If the clinic does not contact you assume the results are satisfactory. You can always view your results on My Chart. If you have questions regarding your health or results, please contact the clinic during office hours. You can also ask questions on My Chart.  We at [facility] are grateful that you chose [place] to provide your care. We strive to provide evidence-based and compassionate care and are always looking for feedback. If you get a survey from the clinic please complete this so we can hear your opinions.  Diabetes Mellitus and Foot Care Diabetes, also called diabetes mellitus, may cause problems with your feet and legs because of poor blood flow (circulation). Poor circulation may make your skin: Become thinner and drier. Break more easily. Heal more slowly. Peel and crack. You may also have nerve damage (neuropathy). This can cause decreased feeling in your legs and feet. This means that you may not notice minor injuries to your feet that could lead to more serious problems. Finding and treating problems early is the best way to prevent future foot problems. How to care for your feet Foot hygiene  Wash your feet daily with warm water and mild soap. Do not use hot water. Then, pat your feet and the areas between your toes until they are fully dry. Do  not soak your feet. This can dry your skin. Trim your toenails straight across. Do not dig under them or around the cuticle. File the edges of your nails with an emery board or nail file. Apply a moisturizing lotion or petroleum jelly to the skin on your feet and to dry, brittle toenails. Use lotion that does not contain alcohol and is unscented. Do not apply lotion between your toes. Shoes and socks Wear clean socks or stockings every day. Make sure they are not too tight. Do not wear knee-high stockings. These may decrease blood flow to your legs. Wear shoes that fit well and have enough cushioning. Always look in your shoes before you put them on to be sure there are no objects inside. To break in new shoes, wear them for just a few hours a day. This prevents injuries on your feet. Wounds, scrapes, corns, and calluses  Check your feet daily for blisters, cuts, bruises, sores, and redness. If you cannot see the bottom of your feet, use a mirror or ask someone for help. Do not cut off corns or calluses or try to remove them with medicine. If you find a minor scrape, cut, or break in the skin on your feet, keep it and the skin around it clean and dry. You may clean these areas with mild soap and water. Do not clean the area with peroxide, alcohol, or iodine. If you have a wound, scrape, corn, or callus on your foot, look at it several times a day to make sure it  is healing and not infected. Check for: Redness, swelling, or pain. Fluid or blood. Warmth. Pus or a bad smell. General tips Do not cross your legs. This may decrease blood flow to your feet. Do not use heating pads or hot water bottles on your feet. They may burn your skin. If you have lost feeling in your feet or legs, you may not know this is happening until it is too late. Protect your feet from hot and cold by wearing shoes, such as at the beach or on hot pavement. Schedule a complete foot exam at least once a year or more often if  you have foot problems. Report any cuts, sores, or bruises to your health care provider right away. Where to find more information American Diabetes Association: diabetes.org Association of Diabetes Care & Education Specialists: diabeteseducator.org Contact a health care provider if: You have a condition that increases your risk of infection, and you have any cuts, sores, or bruises on your feet. You have an injury that is not healing. You have redness on your legs or feet. You feel burning or tingling in your legs or feet. You have pain or cramps in your legs and feet. Your legs or feet are numb. Your feet always feel cold. You have pain around any toenails. Get help right away if: You have a wound, scrape, corn, or callus on your foot and: You have signs of infection. You have a fever. You have a red line going up your leg. This information is not intended to replace advice given to you by your health care provider. Make sure you discuss any questions you have with your health care provider. Document Revised: 08/03/2021 Document Reviewed: 08/03/2021 Elsevier Patient Education  2024 [employer].

## 2023-04-05 ENCOUNTER — Encounter: Payer: Self-pay | Admitting: Nurse Practitioner

## 2023-04-05 ENCOUNTER — Ambulatory Visit (INDEPENDENT_AMBULATORY_CARE_PROVIDER_SITE_OTHER): Payer: Medicare Other | Admitting: Nurse Practitioner

## 2023-04-05 VITALS — BP 128/75 | HR 90 | Temp 97.7°F | Ht 72.0 in | Wt 187.4 lb

## 2023-04-05 DIAGNOSIS — E1143 Type 2 diabetes mellitus with diabetic autonomic (poly)neuropathy: Secondary | ICD-10-CM | POA: Diagnosis not present

## 2023-04-05 DIAGNOSIS — I480 Paroxysmal atrial fibrillation: Secondary | ICD-10-CM

## 2023-04-05 DIAGNOSIS — J449 Chronic obstructive pulmonary disease, unspecified: Secondary | ICD-10-CM | POA: Diagnosis not present

## 2023-04-05 DIAGNOSIS — I739 Peripheral vascular disease, unspecified: Secondary | ICD-10-CM

## 2023-04-05 DIAGNOSIS — E119 Type 2 diabetes mellitus without complications: Secondary | ICD-10-CM

## 2023-04-05 DIAGNOSIS — E1169 Type 2 diabetes mellitus with other specified complication: Secondary | ICD-10-CM | POA: Diagnosis not present

## 2023-04-05 DIAGNOSIS — G894 Chronic pain syndrome: Secondary | ICD-10-CM | POA: Diagnosis not present

## 2023-04-05 DIAGNOSIS — Z7984 Long term (current) use of oral hypoglycemic drugs: Secondary | ICD-10-CM | POA: Diagnosis not present

## 2023-04-05 DIAGNOSIS — N1831 Chronic kidney disease, stage 3a: Secondary | ICD-10-CM | POA: Diagnosis not present

## 2023-04-05 DIAGNOSIS — D6869 Other thrombophilia: Secondary | ICD-10-CM

## 2023-04-05 DIAGNOSIS — E1159 Type 2 diabetes mellitus with other circulatory complications: Secondary | ICD-10-CM

## 2023-04-05 DIAGNOSIS — E1129 Type 2 diabetes mellitus with other diabetic kidney complication: Secondary | ICD-10-CM

## 2023-04-05 DIAGNOSIS — I152 Hypertension secondary to endocrine disorders: Secondary | ICD-10-CM | POA: Diagnosis not present

## 2023-04-05 DIAGNOSIS — E785 Hyperlipidemia, unspecified: Secondary | ICD-10-CM | POA: Diagnosis not present

## 2023-04-05 DIAGNOSIS — R809 Proteinuria, unspecified: Secondary | ICD-10-CM | POA: Diagnosis not present

## 2023-04-05 LAB — MICROALBUMIN, URINE WAIVED
Creatinine, Urine Waived: 300 mg/dL (ref 10–300)
Microalb, Ur Waived: 150 mg/L — ABNORMAL HIGH (ref 0–19)

## 2023-04-05 LAB — BAYER DCA HB A1C WAIVED: HB A1C (BAYER DCA - WAIVED): 6.7 % — ABNORMAL HIGH (ref 4.8–5.6)

## 2023-04-05 MED ORDER — AMLODIPINE BESYLATE 5 MG PO TABS: 7.5000 mg | ORAL_TABLET | Freq: Every day | ORAL | 1 refills | Status: DC

## 2023-04-05 MED ORDER — FINASTERIDE 5 MG PO TABS: 5.0000 mg | ORAL_TABLET | Freq: Every day | ORAL | 3 refills | Status: DC

## 2023-04-05 MED ORDER — ROSUVASTATIN CALCIUM 5 MG PO TABS: 5.0000 mg | ORAL_TABLET | Freq: Every day | ORAL | 4 refills | Status: AC

## 2023-04-05 MED ORDER — METFORMIN HCL 500 MG PO TABS: 500.0000 mg | ORAL_TABLET | Freq: Two times a day (BID) | ORAL | 4 refills | Status: AC

## 2023-04-05 NOTE — Assessment & Plan Note (Signed)
Chronic, ongoing.  Followed by neurosurgery and pain management as needed.  Continue this collaboration and current regimen. Lidocaine patches present at home.

## 2023-04-05 NOTE — Assessment & Plan Note (Signed)
Chronic, ongoing.  Continue current medication regimen, tolerating 5 MG Crestor well without ADR, and adjust as needed.  Lipid panel today.

## 2023-04-05 NOTE — Progress Notes (Signed)
BP 128/75   Pulse 90   Temp 97.7 F (36.5 C) (Oral)   Ht 6' (1.829 m)   Wt 187 lb 6.4 oz (85 kg)   SpO2 98%   BMI 25.42 kg/m    Subjective:    Patient ID: Sean Flax., male    DOB: [birth year], 83 y.o.   MRN: [id number]  HPI: Sean Rumbold. is a 83 y.o. male  Chief Complaint  Patient presents with   Diabetes   Hypertension   Hyperlipidemia   Chronic Kidney Disease   Atrial Fibrillation   DIABETES WITH NEUROPATHY Continues Metformin 500 MG BID. A1c November was 6.9%. Hypoglycemic episodes:no Polydipsia/polyuria: no Visual disturbance: no Chest pain: no Paresthesias: no Glucose Monitoring: yes  Accucheck frequency: 3 times a week   Fasting glucose: 150 to 160  Post prandial:  Evening:  Before meals: Taking Insulin?: no  Long acting insulin:  Short acting insulin: Blood Pressure Monitoring: daily Retinal Examination: Up To Date -- Dr. Beverely Pace in [place] Foot Exam: Up to Date Pneumovax: Up to Date Influenza: Up to Date Aspirin: no    HYPERTENSION / HYPERLIPIDEMIA Continues Amlodipine, Benazepril, and Rosuvastatin.  Last saw pulmonary on 08/08/22, is using Anoro.  Follows with cardiology with last visit on 03/27/23, they recommend SGLT2, although patient concerned with this due to BPH.  They increased Benazepril to 30 MG.  Metoprolol XL discontinued in past due to symptomatic bradycardia.    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  Continues CPAP 100% of the time. Satisfied with current treatment? yes Duration of hypertension: chronic BP monitoring frequency: daily - checks at same time he takes medication BP range: 120/77 to 141/80 and HR 60-80 BP medication side effects: no Duration of hyperlipidemia: chronic Cholesterol medication side effects: no Cholesterol supplements: none Medication compliance: good compliance Aspirin: no Recent stressors: no Recurrent headaches: no Visual changes: no Palpitations: on occasion Dyspnea: occasional  during vigorous activity at baseline Chest pain: no Lower extremity edema: no Dizzy/lightheaded: occasional with changing position  ATRIAL FIBRILLATION Atrial fibrillation status: stable Satisfied with current treatment: yes  Medication side effects:  no Medication compliance: good compliance Etiology of atrial fibrillation: unknown Palpitations: as above Chest pain:  no Dyspnea on exertion: as above Orthopnea:  no Syncope:  no Edema:  no Ventricular rate control: B-blocker Anti-coagulation: long acting   CHRONIC KIDNEY DISEASE CKD 3a.  Continues on Tamsulosin, Proscar, and Vesicare per urology, saw them last 04/19/22. CKD status: stable Medications renally dose: yes Previous renal evaluation: no Pneumovax:  Up to Date Influenza Vaccine:  Up to Date   CHRONIC PAIN  Pain stimulator remains in place. Has neuropathy issues at baseline. Went to PT for period last year which helped his balance, continues to use cane -- he feels legs are getting worse which does strain his back. 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  They recommended Alpha Lipoic Acid, he has stopped taking this as it did not help. Last neurology visit 01/22/23.  Had a fall in December with no fractures. Pain control status: stable Duration: years Location: back and legs Quality: dull, aching, and throbbing Current Pain Level: 7/10 due to weather Previous Pain Level: moderate 7-8/10 Breakthrough pain: no What Activities task can be accomplished with current medication? yes Interested in weaning off narcotics:no   Stool softners/OTC fiber: no  Previous pain specialty evaluation: yes Non-narcotic analgesic meds: no Narcotic contract: no  Relevant past medical, surgical, family and social  history reviewed and updated as indicated. Interim medical history since our last visit reviewed. Allergies and medications reviewed and updated.  Review of Systems  Constitutional:  Negative  for activity change, appetite change, diaphoresis, fatigue and fever.  Respiratory:  Negative for cough, chest tightness, shortness of breath and wheezing.   Cardiovascular:  Negative for chest pain, palpitations and leg swelling.  Gastrointestinal: Negative.   Endocrine: Negative for cold intolerance, heat intolerance, polydipsia, polyphagia and polyuria.  Neurological: Negative.   Psychiatric/Behavioral: Negative.     Per HPI unless specifically indicated above     Objective:    BP 128/75   Pulse 90   Temp 97.7 F (36.5 C) (Oral)   Ht 6' (1.829 m)   Wt 187 lb 6.4 oz (85 kg)   SpO2 98%   BMI 25.42 kg/m   Wt Readings from Last 3 Encounters:  04/05/23 187 lb 6.4 oz (85 kg)  03/27/23 186 lb 6.4 oz (84.6 kg)  01/22/23 183 lb (83 kg)    Physical Exam Vitals and nursing note reviewed.  Constitutional:      General: He is awake. He is not in acute distress.    Appearance: Normal appearance. He is well-developed and well-groomed. He is not ill-appearing or toxic-appearing.     Comments: Gait stable with his cane support.  HENT:     Head: Normocephalic.     Right Ear: Hearing and external ear normal.     Left Ear: Hearing and external ear normal.  Eyes:     General: Lids are normal.     Extraocular Movements: Extraocular movements intact.     Conjunctiva/sclera: Conjunctivae normal.  Neck:     Thyroid: No thyromegaly.     Vascular: No carotid bruit.  Cardiovascular:     Rate and Rhythm: Normal rate. Rhythm irregularly irregular.     Heart sounds: Normal heart sounds. No murmur heard.    No gallop.  Pulmonary:     Effort: No accessory muscle usage or respiratory distress.     Breath sounds: Normal breath sounds.  Abdominal:     General: Bowel sounds are normal. There is no distension.     Palpations: Abdomen is soft.     Tenderness: There is no abdominal tenderness.  Musculoskeletal:     Cervical back: Full passive range of motion without pain.     Right lower leg: No  edema.     Left lower leg: No edema.  Lymphadenopathy:     Cervical: No cervical adenopathy.  Skin:    General: Skin is warm.     Capillary Refill: Capillary refill takes less than 2 seconds.  Neurological:     Mental Status: He is alert and oriented to person, place, and time.     Deep Tendon Reflexes: Reflexes are normal and symmetric.     Reflex Scores:      Brachioradialis reflexes are 2+ on the right side and 2+ on the left side.      Patellar reflexes are 2+ on the right side and 2+ on the left side. Psychiatric:        Attention and Perception: Attention normal.        Mood and Affect: Mood normal.        Speech: Speech normal.        Behavior: Behavior normal. Behavior is cooperative.        Thought Content: Thought content normal.    Results for orders placed or performed in visit on 04/05/23  Bayer DCA Hb A1c Waived   Collection Time: 04/05/23 10:33 AM  Result Value Ref Range   HB A1C (BAYER DCA - WAIVED) 6.7 (H) 4.8 - 5.6 %  Microalbumin, Urine Waived   Collection Time: 04/05/23 10:33 AM  Result Value Ref Range   Microalb, Ur Waived 150 (H) 0 - 19 mg/L   Creatinine, Urine Waived 300 10 - 300 mg/dL   Microalb/Creat Ratio 30-300 (H) <30 mg/g   *Note: Due to a large number of results and/or encounters for the requested time period, some results have not been displayed. A complete set of results can be found in Results Review.      Assessment & Plan:   Problem List Items Addressed This Visit       Cardiovascular and Mediastinum   AF (paroxysmal atrial fibrillation) (HCC)   Chronic, ongoing.  Followed by cardiology at this time.  Continue current medication regimen and collaboration, appreciate their input.  HR with stable rate today.      Relevant Medications   amLODipine (NORVASC) 5 MG tablet   rosuvastatin (CRESTOR) 5 MG tablet   Hypertension associated with diabetes (HCC)   Chronic, ongoing.  BP at goal in office today.  A1c 6.7% today, remaining stable and  urine ALB 150 February 2025.  Continue Metformin and current cardiac medications as ordered.  Will continue collaboration with cardiology, appreciate their input, recent notes reviewed.  Recommend he continue to check BP at home at least 3 mornings a week and document.  DASH diet.  LABS: A1c and CMP.  - Will increase Amlodipine to 7.5 MG, reports he has been taking this. - Statin and ACE on board - Vaccinations up to date - Foot and eye exams up to date      Relevant Medications   amLODipine (NORVASC) 5 MG tablet   metFORMIN (GLUCOPHAGE) 500 MG tablet   rosuvastatin (CRESTOR) 5 MG tablet   Other Relevant Orders   Bayer DCA Hb A1c Waived (Completed)   Microalbumin, Urine Waived (Completed)   Comprehensive metabolic panel   Peripheral vascular disease (HCC)   Chronic, stable.  To bilateral lower extremity.  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  Continue ASA daily and monitor closely for any wounds.  Return to vascular as needed.      Relevant Medications   amLODipine (NORVASC) 5 MG tablet   rosuvastatin (CRESTOR) 5 MG tablet     Respiratory   COPD (chronic obstructive pulmonary disease) (HCC)   Chronic, stable.  Followed by pulmonary at this time, continue this collaboration.  He is using Anoro without issue.  Reports some improvement in SOB, but continues to have this with heavier exertion.        Endocrine   Diabetes mellitus treated with oral medication (HCC)   Refer to diabetes with proteinuria plan of  care.      Relevant Medications   metFORMIN (GLUCOPHAGE) 500 MG tablet   rosuvastatin (CRESTOR) 5 MG tablet   Other Relevant Orders   Bayer DCA Hb A1c Waived (Completed)   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HCC)   Chronic, ongoing with A1c 6.7% today, trend down, and urine ALB 150 (February 2025).  Significant decreased sensation bilateral feet, monitor closely for wounds and falls.  Continue current diabetes medication regimen and adjust as needed. He did not get benefit from  Lyrica or Gabapentin, will continue collaboration with neurology and pain clinic as needed.  Continue to monitor BS daily and document for visits. LABS: A1c and CMP. Did  find benefit from performing physical therapy, but continues to struggle with discomfort. 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  Continues to have stable BS control.  Will continue current medication regimen and adjust as needed.  Recommend he check BS at least 3 times daily.  Continue Benazepril for kidney protection. Discussed possible change to Jardiance vs Metformin for kidney and heart health, but too costly and he does not want to urinate more. - Vaccinations up to date - ACE and Statin on board - Foot and eye exam up to date.      Relevant Medications   metFORMIN (GLUCOPHAGE) 500 MG tablet   rosuvastatin (CRESTOR) 5 MG tablet   Other Relevant Orders   Bayer DCA Hb A1c Waived (Completed)   Microalbumin, Urine Waived (Completed)   Hyperlipidemia associated with type 2 diabetes mellitus (HCC)   Chronic, ongoing.  Continue current medication regimen, tolerating 5 MG Crestor well without ADR, and adjust as needed.  Lipid panel today.      Relevant Medications   amLODipine (NORVASC) 5 MG tablet   metFORMIN (GLUCOPHAGE) 500 MG tablet   rosuvastatin (CRESTOR) 5 MG tablet   Other Relevant Orders   Bayer DCA Hb A1c Waived (Completed)   Comprehensive metabolic panel   Lipid Panel w/o Chol/HDL Ratio     Genitourinary   CKD (chronic kidney disease) stage 3, GFR 30-59 ml/min (HCC)   Chronic, ongoing CKD 3a.  Continue to monitor closely and refer to nephrology if decline.  Continue  Benazepril for kidney protection and proteinuria.  Renal dose medications as needed based on labs.  Would benefit from SGLT2, but refuses due to concern for increased urination which he already struggles with.        Hematopoietic and Hemostatic   Acquired thrombophilia (HCC)   Patient on Eliquis with A-fib, monitor CBC regularly.        Other   Chronic pain syndrome   Chronic, ongoing.  Followed by neurosurgery and pain management as needed.  Continue this collaboration and current regimen. Lidocaine patches present at home.        Follow up plan: Return in about 3 months (around 07/03/2023) for T2DM, HTN/HLD, CKD, A-FIB, PAIN.

## 2023-04-05 NOTE — Assessment & Plan Note (Signed)
Patient on Eliquis with A-fib, monitor CBC regularly.

## 2023-04-05 NOTE — Assessment & Plan Note (Signed)
 Chronic, ongoing.  Followed by cardiology at this time.  Continue current medication regimen and collaboration, appreciate their input.  HR with stable rate today.

## 2023-04-05 NOTE — Assessment & Plan Note (Signed)
Chronic, stable.  To bilateral lower extremity.  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  Continue ASA daily and monitor closely for any wounds.  Return to vascular as needed. 

## 2023-04-05 NOTE — Assessment & Plan Note (Signed)
Chronic, stable.  Followed by pulmonary at this time, continue this collaboration.  He is using Anoro without issue.  Reports some improvement in SOB, but continues to have this with heavier exertion.

## 2023-04-05 NOTE — Assessment & Plan Note (Signed)
 Chronic, ongoing CKD 3a.  Continue to monitor closely and refer to nephrology if decline.  Continue Benazepril for kidney protection and proteinuria.  Renal dose medications as needed based on labs.  Would benefit from SGLT2, but refuses due to concern for increased urination which he already struggles with.

## 2023-04-05 NOTE — Assessment & Plan Note (Signed)
 Refer to diabetes with proteinuria plan of care.

## 2023-04-05 NOTE — Assessment & Plan Note (Signed)
Chronic, ongoing with A1c 6.7% today and urine ALB 150 (February 2025).  Continues to have stable BS control.  Will continue current medication regimen and adjust as needed.  Recommend he check BS at least 3 times daily.  Continue Benazepril for kidney protection. Discussed possible change to Jardiance vs Metformin for kidney and heart health, but too costly and he does not want to urinate more. - Vaccinations up to date - ACE and Statin on board - Foot and eye exam up to date.

## 2023-04-05 NOTE — Assessment & Plan Note (Signed)
Chronic, ongoing.  BP at goal in office today.  A1c 6.7% today, remaining stable and urine ALB 150 February 2025.  Continue Metformin and current cardiac medications as ordered.  Will continue collaboration with cardiology, appreciate their input, recent notes reviewed.  Recommend he continue to check BP at home at least 3 mornings a week and document.  DASH diet.  LABS: A1c and CMP.  - Will increase Amlodipine to 7.5 MG, reports he has been taking this. - Statin and ACE on board - Vaccinations up to date - Foot and eye exams up to date

## 2023-04-05 NOTE — Assessment & Plan Note (Signed)
Chronic, ongoing with A1c 6.7% today, trend down, and urine ALB 150 (February 2025).  Significant decreased sensation bilateral feet, monitor closely for wounds and falls.  Continue current diabetes medication regimen and adjust as needed. He did not get benefit from Lyrica or Gabapentin, will continue collaboration with neurology and pain clinic as needed.  Continue to monitor BS daily and document for visits. LABS: A1c and CMP. Did find benefit from performing physical therapy, but continues to struggle with discomfort. - Statin and ACE on board - Vaccinations up to date - Foot and eye exams up to date

## 2023-04-06 ENCOUNTER — Encounter: Payer: Self-pay | Admitting: Nurse Practitioner

## 2023-04-06 LAB — COMPREHENSIVE METABOLIC PANEL
ALT: 10 [IU]/L (ref 0–44)
AST: 14 [IU]/L (ref 0–40)
Albumin: 4.2 g/dL (ref 3.8–4.8)
Alkaline Phosphatase: 72 [IU]/L (ref 44–121)
BUN/Creatinine Ratio: 17 (ref 10–24)
BUN: 21 mg/dL (ref 8–27)
Bilirubin Total: 0.4 mg/dL (ref 0.0–1.2)
CO2: 25 mmol/L (ref 20–29)
Calcium: 9.3 mg/dL (ref 8.6–10.2)
Chloride: 97 mmol/L (ref 96–106)
Creatinine, Ser: 1.22 mg/dL (ref 0.76–1.27)
Globulin, Total: 2.5 g/dL (ref 1.5–4.5)
Glucose: 234 mg/dL — ABNORMAL HIGH (ref 70–99)
Potassium: 4.2 mmol/L (ref 3.5–5.2)
Sodium: 135 mmol/L (ref 134–144)
Total Protein: 6.7 g/dL (ref 6.0–8.5)
eGFR: 60 mL/min/{1.73_m2} (ref >59)

## 2023-04-06 LAB — LIPID PANEL W/O CHOL/HDL RATIO
Cholesterol, Total: 90 mg/dL — ABNORMAL LOW (ref 100–199)
HDL: 43 mg/dL (ref >39)
LDL Chol Calc (NIH): 30 mg/dL (ref 0–99)
Triglycerides: 85 mg/dL (ref 0–149)
VLDL Cholesterol Cal: 17 mg/dL (ref 5–40)

## 2023-04-06 NOTE — Progress Notes (Signed)
Contacted via MyChart   Good morning Brenen, your labs have returned and overall remain stable.  No medication changes needed.  Great news!!

## 2023-04-12 ENCOUNTER — Other Ambulatory Visit: Payer: Self-pay

## 2023-04-12 DIAGNOSIS — D649 Anemia, unspecified: Secondary | ICD-10-CM

## 2023-04-13 ENCOUNTER — Other Ambulatory Visit: Payer: Self-pay

## 2023-04-13 ENCOUNTER — Inpatient Hospital Stay: Payer: Medicare Other | Attending: Internal Medicine

## 2023-04-13 DIAGNOSIS — N183 Chronic kidney disease, stage 3 unspecified: Secondary | ICD-10-CM | POA: Diagnosis not present

## 2023-04-13 DIAGNOSIS — N400 Enlarged prostate without lower urinary tract symptoms: Secondary | ICD-10-CM

## 2023-04-13 DIAGNOSIS — D631 Anemia in chronic kidney disease: Secondary | ICD-10-CM | POA: Diagnosis not present

## 2023-04-13 DIAGNOSIS — N3281 Overactive bladder: Secondary | ICD-10-CM

## 2023-04-13 DIAGNOSIS — D649 Anemia, unspecified: Secondary | ICD-10-CM

## 2023-04-13 LAB — CBC WITH DIFFERENTIAL/PLATELET
Abs Immature Granulocytes: 0.03 10*3/uL (ref 0.00–0.07)
Basophils Absolute: 0 10*3/uL (ref 0.0–0.1)
Basophils Relative: 0 %
Eosinophils Absolute: 0 10*3/uL (ref 0.0–0.5)
Eosinophils Relative: 0 %
HCT: 35.9 % — ABNORMAL LOW (ref 39.0–52.0)
Hemoglobin: 12.2 g/dL — ABNORMAL LOW (ref 13.0–17.0)
Immature Granulocytes: 1 %
Lymphocytes Relative: 29 %
Lymphs Abs: 1.4 10*3/uL (ref 0.7–4.0)
MCH: 31.4 pg (ref 26.0–34.0)
MCHC: 34 g/dL (ref 30.0–36.0)
MCV: 92.5 fL (ref 80.0–100.0)
Monocytes Absolute: 0.6 10*3/uL (ref 0.1–1.0)
Monocytes Relative: 11 %
Neutro Abs: 2.9 10*3/uL (ref 1.7–7.7)
Neutrophils Relative %: 59 %
Platelets: 164 10*3/uL (ref 150–400)
RBC: 3.88 MIL/uL — ABNORMAL LOW (ref 4.22–5.81)
RDW: 13 % (ref 11.5–15.5)
WBC: 4.9 10*3/uL (ref 4.0–10.5)
nRBC: 0 % (ref 0.0–0.2)

## 2023-04-13 LAB — COMPREHENSIVE METABOLIC PANEL
ALT: 15 U/L (ref 0–44)
AST: 18 U/L (ref 15–41)
Albumin: 3.9 g/dL (ref 3.5–5.0)
Alkaline Phosphatase: 58 U/L (ref 38–126)
Anion gap: 10 (ref 5–15)
BUN: 23 mg/dL (ref 8–23)
CO2: 25 mmol/L (ref 22–32)
Calcium: 9.3 mg/dL (ref 8.9–10.3)
Chloride: 97 mmol/L — ABNORMAL LOW (ref 98–111)
Creatinine, Ser: 1.36 mg/dL — ABNORMAL HIGH (ref 0.61–1.24)
GFR, Estimated: 53 mL/min — ABNORMAL LOW (ref >60)
Glucose, Bld: 187 mg/dL — ABNORMAL HIGH (ref 70–99)
Potassium: 4.2 mmol/L (ref 3.5–5.1)
Sodium: 132 mmol/L — ABNORMAL LOW (ref 135–145)
Total Bilirubin: 0.6 mg/dL (ref 0.0–1.2)
Total Protein: 7.5 g/dL (ref 6.5–8.1)

## 2023-04-13 LAB — RETICULOCYTES
Immature Retic Fract: 6.5 % (ref 2.3–15.9)
RBC.: 3.93 MIL/uL — ABNORMAL LOW (ref 4.22–5.81)
Retic Count, Absolute: 45.2 10*3/uL (ref 19.0–186.0)
Retic Ct Pct: 1.2 % (ref 0.4–3.1)

## 2023-04-13 LAB — IRON AND TIBC
Iron: 78 ug/dL (ref 45–182)
Saturation Ratios: 21 % (ref 17.9–39.5)
TIBC: 368 ug/dL (ref 250–450)
UIBC: 290 ug/dL

## 2023-04-13 LAB — FERRITIN: Ferritin: 100 ng/mL (ref 24–336)

## 2023-04-16 ENCOUNTER — Encounter: Payer: Self-pay | Admitting: Internal Medicine

## 2023-04-16 ENCOUNTER — Inpatient Hospital Stay: Payer: Medicare Other | Attending: Internal Medicine | Admitting: Internal Medicine

## 2023-04-16 ENCOUNTER — Inpatient Hospital Stay: Payer: Medicare Other

## 2023-04-16 VITALS — BP 132/64 | HR 71 | Temp 97.2°F | Resp 20 | Wt 185.5 lb

## 2023-04-16 DIAGNOSIS — M549 Dorsalgia, unspecified: Secondary | ICD-10-CM | POA: Diagnosis not present

## 2023-04-16 DIAGNOSIS — Z7901 Long term (current) use of anticoagulants: Secondary | ICD-10-CM | POA: Insufficient documentation

## 2023-04-16 DIAGNOSIS — N183 Chronic kidney disease, stage 3 unspecified: Secondary | ICD-10-CM | POA: Insufficient documentation

## 2023-04-16 DIAGNOSIS — D649 Anemia, unspecified: Secondary | ICD-10-CM | POA: Diagnosis not present

## 2023-04-16 DIAGNOSIS — G8929 Other chronic pain: Secondary | ICD-10-CM | POA: Insufficient documentation

## 2023-04-16 DIAGNOSIS — Z79899 Other long term (current) drug therapy: Secondary | ICD-10-CM | POA: Insufficient documentation

## 2023-04-16 DIAGNOSIS — E1140 Type 2 diabetes mellitus with diabetic neuropathy, unspecified: Secondary | ICD-10-CM | POA: Diagnosis not present

## 2023-04-16 DIAGNOSIS — E1122 Type 2 diabetes mellitus with diabetic chronic kidney disease: Secondary | ICD-10-CM | POA: Insufficient documentation

## 2023-04-16 NOTE — Progress Notes (Signed)
 Bad back; has neuropathy in back and legs. Back pain is a 6/10 when walking. Pt stays worn out it is difficult to tell if it is from his anemia or other ailments. Dyspnea with exertion (COPD) No blood in stool. Appetite is good.

## 2023-04-16 NOTE — Progress Notes (Signed)
 [facility]  CONSULT NOTE  Patient Care Team: Marjie Skiff, NP as PCP - General (Nurse Practitioner) Iran Ouch, MD as PCP - Cardiology (Cardiology) Hooten, Illene Labrador, MD (Orthopedic Surgery) Leonides Cave, MD as Referring Physician (Urology) Earna Coder, MD as Consulting Physician (Oncology) Venetia Night, MD as Consulting Physician (Neurosurgery) Edward Jolly, MD as Consulting Physician (Pain Medicine) Rodney Langton, RN as Case Manager (General Practice)  CHIEF COMPLAINTS/PURPOSE OF CONSULTATION: ANEMIA   HEMATOLOGY HISTORY: ;  #Chronic anemia hemoglobin 10-11; October 22 -iron saturation 17% ferritin 90s./? CKD-S/p Venofer x4; Jan 2023- Hb 12.3;  EGD/Colonoscopy: Sep, 2022- Dr.Anna  # OCT 2022-non-STEMI- [s/p stenting]aspirin Brilinta; paroxysmal A. fib on Eliquis [Dr. Paraschoes/Dr.Kowalski--> Dr. Arida];CKD stage III; chronic back pain-s/p back surgeries s/p spinal cord-pain stimulator; diabetes on oral medication; 2022-C. difficile colitis-s/p oral vancomycin.   HISTORY OF PRESENTING ILLNESS: Ambulating cane/crutches; alone.  Sean Reyes. 83 y.o.  male iron deficient anemia/?  CKD stage III is here for follow-up.   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 Back pain is a 6/10 when walking. Pt stays worn out it is difficult to tell if it is from his anemia or other ailments. Dyspnea with exertion (COPD) No blood in stool. Appetite is good   Patient currently on oral iron/slow release.  Tolerating well.  No constipation.    Review of Systems  Constitutional:  Positive for malaise/fatigue. Negative for chills, diaphoresis, fever and weight loss.  HENT:  Negative for nosebleeds and sore throat.   Eyes:  Negative for double vision.  Respiratory:  Positive for shortness of breath. Negative for cough, hemoptysis, sputum production and wheezing.   Cardiovascular:  Negative for chest pain, palpitations, orthopnea and leg  swelling.  Gastrointestinal:  Negative for abdominal pain, blood in stool, constipation, diarrhea, heartburn, melena, nausea and vomiting.  Genitourinary:  Negative for dysuria, frequency and urgency.  Musculoskeletal:  Positive for back pain and joint pain.  Skin: Negative.  Negative for itching and rash.  Neurological:  Negative for dizziness, tingling, focal weakness, weakness and headaches.  Endo/Heme/Allergies:  Does not bruise/bleed easily.  Psychiatric/Behavioral:  Negative for depression. The patient is not nervous/anxious and does not have insomnia.     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   Hyperlipidemia    Hypertension    Myocardial infarction (HCC) 12/2020   Nocturia    S/P ablation of atrial fibrillation    Ablative therapy   Sleep apnea    CPAP    Spinal stenosis    Tachycardia, unspecified     SURGICAL HISTORY: Past Surgical History:  Procedure Laterality Date   ABLATION     ANTERIOR LAT LUMBAR FUSION N/A 06/27/2017   Procedure: Anterior Lateral Lumbar Interbody  Fusion - Lumbar Two-Lumbar Three - Lumbar Three-Lumbar Four, Posterior Lumbar Interbody Fusion Lumbar Four- Five;  Surgeon: Donalee Citrin, MD;  Location: [facility] OR;  Service: Neurosurgery;  Laterality: N/A;  Anterior Lateral Lumbar Interbody  Fusion - Lumbar Two-Lumbar Three - Lumbar Three-Lumbar Four, Posterior Lumbar Interbody Fusion Lumbar Four- Five   BACK SURGERY     CARDIAC CATHETERIZATION     CARDIOVERSION N/A 08/29/2018    Procedure: CARDIOVERSION;  Surgeon: Lamar Blinks, MD;  Location: [facility] ORS;  Service: Cardiovascular;  Laterality: N/A;   CARDIOVERSION N/A 09/24/2018   Procedure: CARDIOVERSION;  Surgeon: Lamar Blinks, MD;  Location: [facility] ORS;  Service: Cardiovascular;  Laterality: N/A;   COLONOSCOPY WITH PROPOFOL N/A 10/05/2015   Procedure: COLONOSCOPY WITH PROPOFOL;  Surgeon: Christena Deem, MD;  Location[facility] ENDOSCOPY;  Service: Endoscopy;  Laterality: N/A;   COLONOSCOPY WITH PROPOFOL N/A 11/01/2020   Procedure: COLONOSCOPY WITH PROPOFOL;  Surgeon: Wyline Mood, MD;  Location: [facility] ENDOSCOPY;  Service: Gastroenterology;  Laterality: N/A;   CORONARY STENT INTERVENTION N/A 12/22/2020   Procedure: CORONARY STENT INTERVENTION;  Surgeon: Marcina Millard, MD;  Location: [facility] INVASIVE CV LAB;  Service: Cardiovascular;  Laterality: N/A;   ESOPHAGOGASTRODUODENOSCOPY N/A 11/01/2020   Procedure: ESOPHAGOGASTRODUODENOSCOPY (EGD);  Surgeon: Wyline Mood, MD;  Location: [facility] ENDOSCOPY;  Service: Gastroenterology;  Laterality: N/A;   ESOPHAGOGASTRODUODENOSCOPY (EGD) WITH PROPOFOL N/A 04/01/2018   Procedure: ESOPHAGOGASTRODUODENOSCOPY (EGD) WITH PROPOFOL;  Surgeon: Christena Deem, MD;  Location: [facility] ENDOSCOPY;  Service: Endoscopy;  Laterality: N/A;   ESOPHAGOGASTRODUODENOSCOPY (EGD) WITH PROPOFOL N/A 06/01/2022   Procedure: ESOPHAGOGASTRODUODENOSCOPY (EGD) WITH PROPOFOL;  Surgeon: Wyline Mood, MD;  Location: [facility] ENDOSCOPY;  Service: Gastroenterology;  Laterality: N/A;   EYE SURGERY     HERNIA REPAIR     JOINT REPLACEMENT Bilateral    hips  RT+  LEFT X2    LEFT HEART CATH AND CORONARY ANGIOGRAPHY N/A 12/22/2020   Procedure: LEFT HEART CATH AND CORONARY ANGIOGRAPHY;  Surgeon: Marcina Millard, MD;  Location: [facility] INVASIVE CV LAB;  Service: Cardiovascular;  Laterality: N/A;   LEFT HEART CATH AND CORONARY ANGIOGRAPHY Left 08/23/2021   Procedure: LEFT HEART CATH AND CORONARY ANGIOGRAPHY;  Surgeon:  Lamar Blinks, MD;  Location: [facility] INVASIVE CV LAB;  Service: Cardiovascular;  Laterality: Left;   LUMBAR LAMINECTOMY/DECOMPRESSION MICRODISCECTOMY Left 09/13/2016   Procedure: Microdiscectomy - Lumbar two-three,  Lumbar three- - left;  Surgeon: Donalee Citrin, MD;  Location: [facility] OR;  Service: Neurosurgery;  Laterality: Left;   SPINAL CORD STIMULATOR INSERTION  07/08/2019   TONSILLECTOMY      SOCIAL HISTORY: Social History   Socioeconomic History   Marital status: Married    Spouse name: Elnita Maxwell    Number of children: 2   Years of education: Not on file   Highest education level: High school graduate  Occupational History   Occupation: retired   Tobacco Use   Smoking status: Never   Smokeless tobacco: Never  Vaping Use   Vaping status: Never Used  Substance and Sexual Activity   Alcohol use: No   Drug use: No   Sexual activity: Not on file  Other Topics Concern   Not on file  Social History Narrative   MarriedGets regular exercise.      Lives in [place] with wife/ daughter. Never smoked; no alcohol. 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    Social Drivers of Corporate investment banker Strain: Low Risk  (09/19/2022)   Overall Financial Resource Strain (CARDIA)    Difficulty of Paying Living Expenses: Not hard at all  Food Insecurity: No Food  Insecurity (09/19/2022)   Hunger Vital Sign    Worried About Running Out of Food in the Last Year: Never true    Ran Out of Food in the Last Year: Never true  Transportation Needs: No Transportation Needs (09/19/2022)   PRAPARE - Administrator, Civil Service (Medical): No    Lack of Transportation (Non-Medical): No  Physical Activity: Insufficiently Active (11/23/2022)   Exercise Vital Sign    Days of Exercise per Week: 2 days    Minutes of Exercise per Session: 30 min  Stress: No Stress Concern Present (09/19/2022)   [employer] of Occupational Health - Occupational Stress  Questionnaire    Feeling of Stress : Not at all  Social Connections: Socially Integrated (09/19/2022)   Social Connection and Isolation Panel [NHANES]    Frequency of Communication with Friends and Family: More than three times a week    Frequency of Social Gatherings with Friends and Family: Twice a week    Attends Religious Services: More than 4 times per year    Active Member of [employer] or Organizations: Yes    Attends Engineer, structural: More than 4 times per year    Marital Status: Married  Catering manager Violence: Not At Risk (09/19/2022)   Humiliation, Afraid, Rape, and Kick questionnaire    Fear of Current or Ex-Partner: No    Emotionally Abused: No    Physically Abused: No    Sexually Abused: No    FAMILY HISTORY: Family History  Problem Relation Age of Onset   Brain cancer Mother    Other Father        "blood clots in his lungs"   Kidney disease Neg Hx    Prostate cancer Neg Hx    Kidney cancer Neg Hx    Bladder Cancer Neg Hx     ALLERGIES:  is allergic to levaquin [levofloxacin in d5w], shellfish allergy, amiodarone, and adhesive [tape].  MEDICATIONS:  Current Outpatient Medications  Medication Sig Dispense Refill   amLODipine (NORVASC) 5 MG tablet Take 1.5 tablets (7.5 mg total) by mouth at bedtime. 135 tablet 1   ANORO ELLIPTA 62.5-25 MCG/ACT AEPB Inhale 1 puff into the lungs daily.     benazepril (LOTENSIN) 20 MG tablet Take 1.5 tablets (30 mg total) by mouth daily. 135 tablet 1   cyanocobalamin 1000 MCG tablet Take 1,000 mcg by mouth daily.     ELIQUIS 5 MG TABS tablet Take 1 tablet (5 mg total) by mouth 2 (two) times daily. 180 tablet 4   ferrous sulfate 325 (65 FE) MG EC tablet Take 325 mg by mouth daily with breakfast.     finasteride (PROSCAR) 5 MG tablet Take 1 tablet (5 mg total) by mouth daily. 90 tablet 3   metFORMIN (GLUCOPHAGE) 500 MG tablet Take 1 tablet (500 mg total) by mouth 2 (two) times daily with a meal. 180 tablet 4   omeprazole  (PRILOSEC) 40 MG capsule Take 1 capsule (40 mg total) by mouth in the morning and at bedtime. 180 capsule 1   prednisoLONE acetate (PRED FORTE) 1 % ophthalmic suspension Place 1 drop into both eyes daily.     rosuvastatin (CRESTOR) 5 MG tablet Take 1 tablet (5 mg total) by mouth daily. 90 tablet 4   solifenacin (VESICARE) 5 MG tablet Take 1 tablet (5 mg total) by mouth daily. 90 tablet 3   tamsulosin (FLOMAX) 0.4 MG CAPS capsule Take 2 capsules (0.8 mg total) by mouth daily after supper.  180 capsule 2   No current facility-administered medications for this visit.      PHYSICAL EXAMINATION:   Vitals:   04/16/23 1326  BP: 132/64  Pulse: 71  Resp: 20  Temp: (!) 97.2 F (36.2 C)  SpO2: 97%     Filed Weights   04/16/23 1326  Weight: 185 lb 8 oz (84.1 kg)      Physical Exam Vitals and nursing note reviewed.  HENT:     Head: Normocephalic and atraumatic.     Mouth/Throat:     Pharynx: Oropharynx is clear.  Eyes:     Extraocular Movements: Extraocular movements intact.     Pupils: Pupils are equal, round, and reactive to light.  Cardiovascular:     Rate and Rhythm: Normal rate and regular rhythm.  Pulmonary:     Comments: Decreased breath sounds bilaterally.  Abdominal:     Palpations: Abdomen is soft.  Musculoskeletal:        General: Normal range of motion.     Cervical back: Normal range of motion.  Skin:    General: Skin is warm.  Neurological:     General: No focal deficit present.     Mental Status: He is alert and oriented to person, place, and time.  Psychiatric:        Behavior: Behavior normal.        Judgment: Judgment normal.     LABORATORY DATA:  I have reviewed the data as listed Lab Results  Component Value Date   WBC 4.9 04/13/2023   HGB 12.2 (L) 04/13/2023   HCT 35.9 (L) 04/13/2023   MCV 92.5 04/13/2023   PLT 164 04/13/2023   Recent Labs    05/25/22 2017 07/03/22 1109 10/11/22 1253 01/03/23 1040 04/05/23 1033 04/13/23 1319  NA  132*   < > 131* 136 135 132*  K 3.9   < > 4.3 4.1 4.2 4.2  CL 98   < > 99 99 97 97*  CO2 25   < > 25 23 25 25   GLUCOSE 147*   < > 217* 314* 234* 187*  BUN 26*   < > 23 18 21 23   CREATININE 1.18   < > 1.23 1.21 1.22 1.36*  CALCIUM 9.3   < > 9.2 9.4 9.3 9.3  GFRNONAA >60  --  59*  --   --  53*  PROT  --   --  7.3 6.4 6.7 7.5  ALBUMIN  --   --  3.9 4.0 4.2 3.9  AST  --   --  17 17 14 18   ALT  --   --  14 15 10 15   ALKPHOS  --    < > 68 78 72 58  BILITOT  --   --  0.4 0.4 0.4 0.6   < > = values in this interval not displayed.     No results found.  Symptomatic anemia Mild to moderate symptomatic anemia Hb 11-12;/Likely CKD-III. S/p IV venofer weekly x4.; on slow release PO iron.   # FEb 2025-- Iron sat- 21%; ferritin- 100- HOLD IV venofer. Hb 12- continue PO iron  # Mild thrombocytopenia platelets 145.  Monitor for now.   # CKD stage III [GFR 50s]- stable  # CAD [sp stenting OCT 2022; Dr.Arida]; on Eliquis/antiplatelet therapy.stable  *BNP-dr.Arida # DISPOSITION:  # HOLD venofer- # in 6 months- MD; labs- cbc/cmp; iron studies/feritin-;possible venofer- Dr.B  All questions were answered. The patient knows to call the clinic with  any problems, questions or concerns.    Earna Coder, MD 04/16/2023 1:39 PM

## 2023-04-16 NOTE — Assessment & Plan Note (Addendum)
 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 S/p IV venofer weekly x4.; on slow release PO iron.   # FEb 2025-- Iron sat- 21%; ferritin- 100- HOLD IV venofer. Hb 12- continue PO iron  # Mild thrombocytopenia platelets 145.  Monitor for now.   # CKD stage III [GFR 50s]- stable  # CAD [sp stenting OCT 2022; Dr.Arida]; on Eliquis/antiplatelet therapy.stable  *BNP-dr.Arida # DISPOSITION:  # HOLD venofer- # in 6 months- MD; labs- cbc/cmp; iron studies/feritin-;possible venofer- Dr.B

## 2023-04-17 ENCOUNTER — Ambulatory Visit: Payer: Self-pay | Admitting: *Deleted

## 2023-04-17 NOTE — Patient Outreach (Signed)
 Care Coordination   Follow Up Visit Note   04/17/2023 Name: Sean Reyes. MRN: [id number] DOB: 1940/02/25  Sean Reyes. is a 83 y.o. year old male who sees [place], Corrie Dandy T, NP for primary care. I spoke with  Sean Reyes. by phone today.  What matters to the patients health and wellness today?  Patient report doing ok, chronic pain due to neuropathy.  Medication management attempted, but didn't work. He will still work with Neurology and pain clinic for further management.  Denies any urgent concerns, encouraged to contact this care manager with questions.    Goals Addressed             This Visit's Progress    COMPLETED: Care Coordination Activities       Interventions Today    Flowsheet Row Most Recent Value  Chronic Disease   Chronic disease during today's visit Other  [chronic pain]  General Interventions   General Interventions Discussed/Reviewed General Interventions Reviewed, Doctor Visits  Doctor Visits Discussed/Reviewed Doctor Visits Reviewed, Specialist  [upcoming urology tomorrow, Neurology 3/11, ad cardiology 4/1]  PCP/Specialist Visits Compliance with follow-up visit  Education Interventions   Education Provided Provided Education  Provided Verbal Education On Medication, When to see the doctor  [facility] reviewed, report taking as instructed. Encouraged to reonsider PT services, state he will call office to see if he need new referral.]              SDOH assessments and interventions completed:  No     Care Coordination Interventions:  Yes, provided   Follow up plan: No further intervention required.   Encounter Outcome:  Patient Visit Completed  Rodney Langton, RN, MSN, CCM   [facility], [facility] RN Care Coordinator Direct Dial: [phone] / Main [phone] Fax [phone] Email: [email or web address]

## 2023-04-18 ENCOUNTER — Ambulatory Visit: Payer: Medicare Other | Admitting: Urology

## 2023-04-18 VITALS — BP 136/74 | HR 88 | Ht 72.0 in | Wt 184.8 lb

## 2023-04-18 DIAGNOSIS — N3281 Overactive bladder: Secondary | ICD-10-CM | POA: Diagnosis not present

## 2023-04-18 DIAGNOSIS — N400 Enlarged prostate without lower urinary tract symptoms: Secondary | ICD-10-CM

## 2023-04-18 LAB — BLADDER SCAN AMB NON-IMAGING

## 2023-04-18 MED ORDER — SOLIFENACIN SUCCINATE 5 MG PO TABS: 5.0000 mg | ORAL_TABLET | Freq: Every day | ORAL | 3 refills | Status: AC

## 2023-04-18 MED ORDER — FINASTERIDE 5 MG PO TABS
5.0000 mg | ORAL_TABLET | Freq: Every day | ORAL | 3 refills | Status: AC
Start: 2023-04-18 — End: ?

## 2023-04-18 MED ORDER — TAMSULOSIN HCL 0.4 MG PO CAPS: 0.8000 mg | ORAL_CAPSULE | Freq: Every day | ORAL | 3 refills | Status: AC

## 2023-04-18 NOTE — Progress Notes (Signed)
   04/18/2023 9:30 AM   Sean Reyes. 03-Jan-1941 [id number]  Reason for visit: Follow up BPH, OAB, LUTS  HPI: Very complex and comorbid 83 year old male with a long history of both obstructive and overactive symptoms.  He has previously been managed by Dr. Lonna Cobb, Dr. Celso Amy, [facility], and [facility] urology.  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  He is compliant with CPAP for sleep apnea.  He has been managed on maximal medical therapy with Flomax, finasteride, and Vesicare, and really has been doing very well over the last year.  He has nocturia 1-2 times overnight which is significantly improved from prior.  He has some dry mouth from the Vesicare but he would like to continue that medication as it improves his frequency significantly.  He previously was trialed on beta 3 agonist, but had no improvement, he understands the risks of fall/confusion with the Vesicare and would like to continue that medication.  PVR today is normal at 27ml.  Continue maximal medical therapy with Flomax, finasteride, Vesicare, refilled RTC 1 year PVR  Sondra Come, MD  [facility] [address], [address] [place], [place] [address] [phone]

## 2023-04-23 ENCOUNTER — Other Ambulatory Visit: Payer: Self-pay | Admitting: Nurse Practitioner

## 2023-04-24 DIAGNOSIS — R200 Anesthesia of skin: Secondary | ICD-10-CM | POA: Diagnosis not present

## 2023-04-24 DIAGNOSIS — G629 Polyneuropathy, unspecified: Secondary | ICD-10-CM | POA: Diagnosis not present

## 2023-04-24 DIAGNOSIS — G5603 Carpal tunnel syndrome, bilateral upper limbs: Secondary | ICD-10-CM | POA: Diagnosis not present

## 2023-04-24 DIAGNOSIS — R531 Weakness: Secondary | ICD-10-CM | POA: Diagnosis not present

## 2023-04-24 NOTE — Telephone Encounter (Signed)
 Requested Prescriptions  Pending Prescriptions Disp Refills   ELIQUIS 5 MG TABS tablet [Pharmacy Med Name: ELIQUIS 5 MG TABLET] 180 tablet 0    Sig: Take 1 tablet (5 mg total) by mouth 2 (two) times daily.     Hematology:  Anticoagulants - apixaban Failed - 04/24/2023  1:43 PM      Failed - HGB in normal range and within 360 days    Hemoglobin  Date Value Ref Range Status  04/13/2023 12.2 (L) 13.0 - 17.0 g/dL Final  16/11/9602 54.0 (L) 13.0 - 17.0 g/dL Final  98/12/9145 82.9 (L) 13.0 - 17.7 g/dL Final         Failed - HCT in normal range and within 360 days    HCT  Date Value Ref Range Status  04/13/2023 35.9 (L) 39.0 - 52.0 % Final   Hematocrit  Date Value Ref Range Status  04/04/2022 37.5 37.5 - 51.0 % Final         Failed - Cr in normal range and within 360 days    Creatinine  Date Value Ref Range Status  10/11/2022 1.23 0.61 - 1.24 mg/dL Final  56/21/3086 5.78 0.60 - 1.30 mg/dL Final   Creatinine, Ser  Date Value Ref Range Status  04/13/2023 1.36 (H) 0.61 - 1.24 mg/dL Final         Passed - PLT in normal range and within 360 days    Platelets  Date Value Ref Range Status  04/13/2023 164 150 - 400 K/uL Final  04/04/2022 163 150 - 450 x10E3/uL Final   Platelet Count  Date Value Ref Range Status  10/11/2022 135 (L) 150 - 400 K/uL Final         Passed - AST in normal range and within 360 days    AST  Date Value Ref Range Status  04/13/2023 18 15 - 41 U/L Final  10/11/2022 17 15 - 41 U/L Final         Passed - ALT in normal range and within 360 days    ALT  Date Value Ref Range Status  04/13/2023 15 0 - 44 U/L Final  10/11/2022 14 0 - 44 U/L Final         Passed - Valid encounter within last 12 months    Recent Outpatient Visits           3 months ago Diabetes mellitus with proteinuria (HCC)   [place] [facility] [place], [place] T, NP   6 months ago AF (paroxysmal atrial fibrillation) (HCC)   [facility] [place]  [place], Corrie Dandy T, NP   6 months ago Diabetes mellitus with proteinuria (HCC)   [place] [facility] [place], Corrie Dandy T, NP   9 months ago Diabetes mellitus with proteinuria (HCC)   [place] [facility] [place], Corrie Dandy T, NP   1 year ago Diabetes mellitus with proteinuria (HCC)   [place] [facility] L[place], Dorie Rank, NP       Future Appointments             In 3 weeks [place], Lavonna Rua, NP [facility] [place]   In 2 months [place], Dorie Rank, NP [facility]   In 11 months [place], Laurette Schimke, MD [facility] [place]

## 2023-05-15 ENCOUNTER — Encounter: Payer: Self-pay | Admitting: Cardiology

## 2023-05-15 ENCOUNTER — Ambulatory Visit: Payer: Medicare Other | Attending: Cardiology | Admitting: Cardiology

## 2023-05-15 VITALS — BP 136/70 | HR 70 | Ht 73.0 in | Wt 178.0 lb

## 2023-05-15 DIAGNOSIS — E119 Type 2 diabetes mellitus without complications: Secondary | ICD-10-CM | POA: Diagnosis not present

## 2023-05-15 DIAGNOSIS — I2510 Atherosclerotic heart disease of native coronary artery without angina pectoris: Secondary | ICD-10-CM

## 2023-05-15 DIAGNOSIS — E785 Hyperlipidemia, unspecified: Secondary | ICD-10-CM | POA: Diagnosis not present

## 2023-05-15 DIAGNOSIS — I10 Essential (primary) hypertension: Secondary | ICD-10-CM | POA: Diagnosis not present

## 2023-05-15 DIAGNOSIS — J449 Chronic obstructive pulmonary disease, unspecified: Secondary | ICD-10-CM | POA: Diagnosis not present

## 2023-05-15 DIAGNOSIS — Z7984 Long term (current) use of oral hypoglycemic drugs: Secondary | ICD-10-CM | POA: Diagnosis not present

## 2023-05-15 DIAGNOSIS — I480 Paroxysmal atrial fibrillation: Secondary | ICD-10-CM | POA: Diagnosis not present

## 2023-05-15 DIAGNOSIS — R0609 Other forms of dyspnea: Secondary | ICD-10-CM | POA: Diagnosis not present

## 2023-05-15 DIAGNOSIS — G4733 Obstructive sleep apnea (adult) (pediatric): Secondary | ICD-10-CM

## 2023-05-15 NOTE — Progress Notes (Signed)
 Cardiology Office Note:  .   Date:  05/15/2023  ID:  Sean Reyes., DOB 1940/02/20, MRN [id number] PCP: Marjie Skiff, NP  [facility] Providers Cardiologist:  Lorine Bears, MD    History of Present Illness: .   Sean Reyes. is a 83 y.o. male with a past medical history of paroxysmal atrial fibrillation, coronary disease, abnormal EKG with PVCs, essential hypertension, hyperlipidemia, type 2 diabetes, chronic kidney disease, who is here today to follow-up on his paroxysmal atrial fibrillation and his coronary artery disease.   Cardiac catheterization 11/22 severe proximal RCA stenosis with successful PCI-DES. Repeat cardiac catheterization showed patent RCA stent with no significant in-stent restenosis. He had undergone ablation procedure at [facility] several years ago and reports having complications during the procedure. He subsequently had the ablation done at [facility] at a later date. He was originally placed on propafenone which was later changed to metoprolol after his myocardial infarction there was subsequently discontinued due to bradycardia. Echocardiogram 09/07/2021 revealed LVEF of 60 to 60%, no regional wall motion abnormalities, G1 DD, trivial MR. ZIO monitor revealed predominantly underlying rhythm of sinus with first-degree AV block, 1 run of ventricular tachycardia lasting 7 beats for total of 96 supraventricular tachycardia runs. He was restarted on beta-blocker therapy.  Unfortunately he then had had a symptomatic bradycardia and a beta-blocker therapy had to be stopped.    He was last seen in clinic 03/27/2023 continue to have chronic shortness of breath and fatigue that was unchanged.  He denied any chest pain or palpitations.  Continues to suffer from neck pain and arthritis as well as neuropathy.  He has been compliant with his apixaban without any missed doses and denies any bleeding with any blood noted in his stool or urine.  He had also denied any hospitalizations or  visits to the emergency department.  His benazepril was increased to 30 mg daily with upcoming blood work with his PCP in 2 weeks after medication changes.  He returns to clinic today with complaints of left leg pain.  He stated that he had mowed his grass over the weekend and when he had woken up on Sunday morning he was unable to bear weight to the leg.  He has an upcoming appointment for reevaluation.  States that he has been keeping a blood pressure log since having increased his benazepril blood pressure has been very well-controlled.  He is concerned today as when he had his previously heart attack his blood pressure was elevated and he was having neck discomfort.  He is continued to start having neck discomfort on the right side of his neck today but not into his jaw.  He has not undergone any stress testing since his event and has questions about that today.  He continues to have chronic shortness of breath from his COPD.  He states that he has been compliant with his medications without any adverse effects.  Denies any hospitalizations or visits to the emergency department.  ROS: 10 point review of system has been reviewed and considered negative exception was been listed in the HPI  Studies Reviewed: [name]   EKG Interpretation Date/Time:  Tuesday May 15 2023 14:23:44 EDT Ventricular Rate:  70 PR Interval:  216 QRS Duration:  86 QT Interval:  388 QTC Calculation: 419 R Axis:   18  Text Interpretation: Sinus rhythm with 1st degree A-V block Left ventricular hypertrophy When compared with ECG of 27-Mar-2023 11:03, No significant change was found Confirmed by Brandye Inthavong,  Aldon Hengst ([id number]) on 05/15/2023 2:26:56 PM    Event Monitor (Zio) 10/20/2022 Patient had a min HR of 34 bpm, max HR of 171 bpm, and avg HR of 69 bpm. Predominant underlying rhythm was Sinus Rhythm.  160 Supraventricular Tachycardia runs occurred, the run with the fastest interval lasting 8 beats with a max rate of 171 bpm, the longest  lasting 14.1 secs with an avg rate of 99 bpm.  Frequent PACs with a burden of 20% Rare PVCs. Most triggered events correlated with PACs. Risk Assessment/Calculations:    CHA2DS2-VASc Score = 4   This indicates a 4.8% annual risk of stroke. The patient's score is based upon: CHF History: 0 HTN History: 1 Diabetes History: 1 Stroke History: 0 Vascular Disease History: 0 Age Score: 2 Gender Score: 0            Physical Exam:   VS:  BP 136/70   Pulse 70   Ht 6\' 1"  (1.854 m)   Wt 178 lb (80.7 kg)   SpO2 98%   BMI 23.48 kg/m    Wt Readings from Last 3 Encounters:  05/15/23 178 lb (80.7 kg)  04/18/23 184 lb 12.8 oz (83.8 kg)  04/16/23 185 lb 8 oz (84.1 kg)    GEN: Well nourished, well developed in no acute distress NECK: No JVD; No carotid bruits CARDIAC: RRR, no murmurs, rubs, gallops RESPIRATORY:  Clear to auscultation without rales, wheezing or rhonchi  ABDOMEN: Soft, non-tender, non-distended EXTREMITIES:  No edema; No deformity   ASSESSMENT AND PLAN: .   Coronary artery disease of on the native coronary artery with concerns for angina.  Status post myocardial infarction with stent placement to the right coronary artery in November 2022.  He did have borderline disease that was noted to be in the distal LAD with vessel was too small for stent placement.  Unfortunately he has started with continued symptoms that he had felt previously of neck discomfort, fatigue, and shortness of breath that he particularly thought was related to his COPD and now he is unsure.  He has not had any further ischemic workup since his last stent placed.  EKG today reveals sinus rhythm with first-degree AV block at a rate of 70 with LVH with no significant changes noted.  With recurrence of symptoms and known disease he has been scheduled for cardiac PET stress to reevaluate for any ischemic causes of his discomfort.  He is continued on apixaban 5 mg twice daily and lieu of aspirin and rosuvastatin 5  mg daily.  Paroxysmal atrial fibrillation where he is currently maintaining sinus rhythm.  EKG today continues to reveal sinus rhythm.  He is continued on apixaban 5 mg twice daily for CHA2DS2-VASc score of at least 4 for stroke prophylaxis.  He is no longer on beta-blocker therapy due to symptomatic bradycardia in the past.  Primary hypertension with a blood pressure 136/70 which is better controlled on his increased benazepril dose of 30 mg daily.  He has been encouraged to continue to monitor his blood pressures 1 to 2 hours postmedication administration he is also maintained on amlodipine 7.5 mg.  Mixed hyperlipidemia with his last LDL of 30.  LDL remains stable and below goal.  He is continued on rosuvastatin 5 mg daily  Chronic dyspnea on exertion with COPD Gold stage II he states that his shortness of breath has been stable and unchanged.  Unable to increase his activity after a fall in December and arrives to clinic today nonweight bearing  requiring a wheelchair.  He is encouraged to continue to follow-up with pulmonary as scheduled.  He is also continued on inhalers.  Obstructive sleep apnea where has been compliant with CPAP.  Type 2 diabetes he is continued on metformin 500 mg twice daily.  Ongoing management by his PCP.    Informed Consent   Shared Decision Making/Informed Consent The risks [chest pain, shortness of breath, cardiac arrhythmias, dizziness, blood pressure fluctuations, myocardial infarction, stroke/transient ischemic attack, nausea, vomiting, allergic reaction, radiation exposure, metallic taste sensation and life-threatening complications (estimated to be 1 in 10,000)], benefits (risk stratification, diagnosing coronary artery disease, treatment guidance) and alternatives of a cardiac PET stress test were discussed in detail with Mr. Collingsworth and he agrees to proceed.     Dispo: Patient return to clinic to see MD/APP in 3 months or sooner if needed.  He has been advised  that if his testing results is abnormal his follow-up appointment will be moved up and he will return to the office sooner than 3 months.  Signed, Obelia Bonello, NP

## 2023-05-15 NOTE — Patient Instructions (Addendum)
 Medication Instructions:  Your physician recommends that you continue on your current medications as directed. Please refer to the Current Medication list given to you today.  *If you need a refill on your cardiac medications before your next appointment, please call your pharmacy*  Lab Work: No labs ordered today  If you have labs (blood work) drawn today and your tests are completely normal, you will receive your results only by: MyChart Message (if you have MyChart) OR A paper copy in the mail If you have any lab test that is abnormal or we need to change your treatment, we will call you to review the results.  Testing/Procedures:    Please report to Radiology at the [facility] Main Entrance 30 minutes early for your test.  [address] [place], [place] [address]                         OR   Please report to Radiology at [facility] Main Entrance, medical mall, 30 mins prior to your test.  [address]  [place], [place]  How to Prepare for Your Cardiac PET/CT Stress Test:  Nothing to eat or drink, except water, 3 hours prior to arrival time.  NO caffeine/decaffeinated products, or chocolate 12 hours prior to arrival. (Please note decaffeinated beverages (teas/coffees) still contain caffeine).  If you have caffeine within 12 hours prior, the test will need to be rescheduled.  Medication instructions: Do not take erectile dysfunction medications for 72 hours prior to test (sildenafil, tadalafil) Do not take nitrates (isosorbide mononitrate, Ranexa) the day before or day of test Do not take tamsulosin the day before or morning of test Hold theophylline containing medications for 12 hours. Hold Dipyridamole 48 hours prior to the test.  Diabetic Preparation: If able to eat breakfast prior to 3 hour fasting, you may take all medications, including your insulin. Do not worry if you miss your breakfast dose of insulin - start at your next  meal. If you do not eat prior to 3 hour fast-Hold all diabetes (oral and insulin) medications. Patients who wear a continuous glucose monitor MUST remove the device prior to scanning.  You may take your remaining medications with water.  NO perfume, cologne or lotion on chest or abdomen area.   Total time is 1 to 2 hours; you may want to bring reading material for the waiting time.    In preparation for your appointment, medication and supplies will be purchased.  Appointment availability is limited, so if you need to cancel or reschedule, please call the Radiology Department Scheduler at [phone] 24 hours in advance to avoid a cancellation fee of $100.00  What to Expect When you Arrive:  Once you arrive and check in for your appointment, you will be taken to a preparation room within the Radiology Department.  A technologist or Nurse will obtain your medical history, verify that you are correctly prepped for the exam, and explain the procedure.  Afterwards, an IV will be started in your arm and electrodes will be placed on your skin for EKG monitoring during the stress portion of the exam. Then you will be escorted to the PET/CT scanner.  There, staff will get you positioned on the scanner and obtain a blood pressure and EKG.  During the exam, you will continue to be connected to the EKG and blood pressure machines.  A small, safe amount of a radioactive tracer will be  injected in your IV to obtain a series of pictures of your heart along with an injection of a stress agent.    After your Exam:  It is recommended that you eat a meal and drink a caffeinated beverage to counter act any effects of the stress agent.  Drink plenty of fluids for the remainder of the day and urinate frequently for the first couple of hours after the exam.  Your doctor will inform you of your test results within 7-10 business days.  For more information and frequently asked questions, please visit our  website: [email or web address]  For questions about your test or how to prepare for your test, please call: Cardiac Imaging Nurse Navigators Office: [phone]   Follow-Up: At [facility], you and your health needs are our priority.  As part of our continuing mission to provide you with exceptional heart care, our providers are all part of one team.  This team includes your primary Cardiologist (physician) and Advanced Practice Providers or APPs (Physician Assistants and Nurse Practitioners) who all work together to provide you with the care you need, when you need it.  Your next appointment:   3 month(s)  Provider:   You may see Lorine Bears, MD or one of the following Advanced Practice Providers on your designated Care Team:   Nicolasa Ducking, NP Ames Dura, PA-C Eula Listen, PA-C Cadence Elliott, PA-C Charlsie Quest, NP Carlos Levering, NP    We recommend signing up for the patient portal called "MyChart".  Sign up information is provided on this After Visit Summary.  MyChart is used to connect with patients for Virtual Visits (Telemedicine).  Patients are able to view lab/test results, encounter notes, upcoming appointments, etc.  Non-urgent messages can be sent to your provider as well.   To learn more about what you can do with MyChart, go to [email or web address].

## 2023-05-17 ENCOUNTER — Ambulatory Visit: Attending: Neurology

## 2023-05-17 DIAGNOSIS — M6281 Muscle weakness (generalized): Secondary | ICD-10-CM

## 2023-05-17 DIAGNOSIS — R262 Difficulty in walking, not elsewhere classified: Secondary | ICD-10-CM

## 2023-05-22 ENCOUNTER — Ambulatory Visit

## 2023-05-24 ENCOUNTER — Encounter

## 2023-05-29 ENCOUNTER — Ambulatory Visit: Attending: Neurology

## 2023-05-29 DIAGNOSIS — R262 Difficulty in walking, not elsewhere classified: Secondary | ICD-10-CM | POA: Insufficient documentation

## 2023-05-29 DIAGNOSIS — M6281 Muscle weakness (generalized): Secondary | ICD-10-CM | POA: Diagnosis not present

## 2023-05-29 NOTE — Therapy (Signed)
 OUTPATIENT PHYSICAL THERAPY BALANCE TREATMENT   Patient Name: Sean Reyes. MRN: [id number] DOB:1940-06-28, 83 y.o., male Today's Date: 05/31/2023  END OF SESSION:  PT End of Session - 05/31/23 1047     Visit Number 2    Number of Visits 25    Date for PT Re-Evaluation 08/21/23    Authorization Type eval: 05/29/23;    Authorization Time Period Medicare A&B 2025  VL: Based on [place]  No auth req    PT Start Time 1100    PT Stop Time 1145    PT Time Calculation (min) 45 min    Equipment Utilized During Treatment Gait belt    Activity Tolerance Patient tolerated treatment well;Patient limited by pain    Behavior During Therapy WFL for tasks assessed/performed            Past Medical History:  Diagnosis Date   Anemia    Anxiety    Arthritis    Arthritis of neck    Atrial fibrillation (HCC)    Cataracts, bilateral    Complication of anesthesia    pt reports low BP's after surgery at [facility] and difficulty awakening   Depression    Diabetes (HCC)    dx 6-8 yrs ago   Dysrhythmia    a-fib   GERD (gastroesophageal reflux disease)    OCC TAKES ALKA SELTZER   History of kidney stones    10-15 yrs ago   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   Hyperlipidemia    Hypertension    Myocardial infarction (HCC) 12/2020   Nocturia    S/P ablation of atrial fibrillation    Ablative therapy   Sleep apnea    CPAP    Spinal stenosis    Tachycardia, unspecified    Past Surgical History:  Procedure Laterality Date   ABLATION     ANTERIOR LAT LUMBAR FUSION N/A 06/27/2017   Procedure: Anterior Lateral Lumbar Interbody  Fusion - Lumbar Two-Lumbar Three - Lumbar Three-Lumbar Four, Posterior Lumbar Interbody Fusion Lumbar Four- Five;  Surgeon: Donalee Citrin, MD;  Location: [facility] OR;  Service: Neurosurgery;  Laterality: N/A;  Anterior Lateral Lumbar Interbody  Fusion - Lumbar Two-Lumbar Three - Lumbar Three-Lumbar Four, Posterior Lumbar Interbody Fusion Lumbar Four- Five    BACK SURGERY     CARDIAC CATHETERIZATION     CARDIOVERSION N/A 08/29/2018   Procedure: CARDIOVERSION;  Surgeon: Lamar Blinks, MD;  Location: [facility] ORS;  Service: Cardiovascular;  Laterality: N/A;   CARDIOVERSION N/A 09/24/2018   Procedure: CARDIOVERSION;  Surgeon: Lamar Blinks, MD;  Location: [facility] ORS;  Service: Cardiovascular;  Laterality: N/A;   COLONOSCOPY WITH PROPOFOL N/A 10/05/2015   Procedure: COLONOSCOPY WITH PROPOFOL;  Surgeon: Christena Deem, MD;  Location: [facility] ENDOSCOPY;  Service: Endoscopy;  Laterality: N/A;   COLONOSCOPY WITH PROPOFOL N/A 11/01/2020   Procedure: COLONOSCOPY WITH PROPOFOL;  Surgeon: Wyline Mood, MD;  Location: [facility] ENDOSCOPY;  Service: Gastroenterology;  Laterality: N/A;   CORONARY STENT INTERVENTION N/A 12/22/2020   Procedure: CORONARY STENT INTERVENTION;  Surgeon: Marcina Millard, MD;  Location: [facility] INVASIVE CV LAB;  Service: Cardiovascular;  Laterality: N/A;   ESOPHAGOGASTRODUODENOSCOPY N/A 11/01/2020   Procedure: ESOPHAGOGASTRODUODENOSCOPY (EGD);  Surgeon: Wyline Mood, MD;  Location: [facility] ENDOSCOPY;  Service: Gastroenterology;  Laterality: N/A;   ESOPHAGOGASTRODUODENOSCOPY (EGD) WITH PROPOFOL N/A 04/01/2018   Procedure: ESOPHAGOGASTRODUODENOSCOPY (EGD) WITH PROPOFOL;  Surgeon: Christena Deem, MD;  Location: [facility] ENDOSCOPY;  Service: Endoscopy;  Laterality: N/A;   ESOPHAGOGASTRODUODENOSCOPY (  EGD) WITH PROPOFOL N/A 06/01/2022   Procedure: ESOPHAGOGASTRODUODENOSCOPY (EGD) WITH PROPOFOL;  Surgeon: Luke Salaam, MD;  Location: [facility] ENDOSCOPY;  Service: Gastroenterology;  Laterality: N/A;   EYE SURGERY     HERNIA REPAIR     JOINT REPLACEMENT Bilateral    hips  RT+  LEFT X2    LEFT HEART CATH AND CORONARY ANGIOGRAPHY N/A 12/22/2020   Procedure: LEFT HEART CATH AND CORONARY ANGIOGRAPHY;  Surgeon: Percival Brace, MD;  Location: [facility] INVASIVE CV LAB;  Service: Cardiovascular;  Laterality: N/A;   LEFT HEART CATH AND CORONARY ANGIOGRAPHY Left  08/23/2021   Procedure: LEFT HEART CATH AND CORONARY ANGIOGRAPHY;  Surgeon: Michelle Aid, MD;  Location: [facility] INVASIVE CV LAB;  Service: Cardiovascular;  Laterality: Left;   LUMBAR LAMINECTOMY/DECOMPRESSION MICRODISCECTOMY Left 09/13/2016   Procedure: Microdiscectomy - Lumbar two-three,  Lumbar three- - left;  Surgeon: Gearl Keens, MD;  Location: [facility] OR;  Service: Neurosurgery;  Laterality: Left;   SPINAL CORD STIMULATOR INSERTION  07/08/2019   TONSILLECTOMY     Patient Active Problem List   Diagnosis Date Noted   Diabetes mellitus treated with oral medication (HCC) 04/05/2023   Finger numbness 10/13/2022   Subareolar mass of left breast 10/03/2022   COPD (chronic obstructive pulmonary disease) (HCC) 10/31/2021   Shortness of breath on exertion 08/04/2021   Diabetes mellitus with proteinuria (HCC) 04/04/2021   GERD without esophagitis 04/02/2021   Coronary artery disease 12/28/2020   History of non-ST elevation myocardial infarction (NSTEMI) 12/21/2020   Pain in right shin 10/25/2020   Peripheral vascular disease (HCC) 08/06/2020   Chronic pain syndrome 07/06/2020   Cervical facet joint syndrome 04/08/2020   Spinal cord stimulator status 12/11/2019   Elevated TSH 08/20/2019   CKD (chronic kidney disease) stage 3, GFR 30-59 ml/min (HCC) 01/19/2019   Acquired thrombophilia (HCC) 01/19/2019   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   Postlaminectomy syndrome, lumbar region 01/16/2019   History of fusion of lumbar spine (L2-L5) 01/16/2019   Chronic radicular lumbar pain 01/16/2019   HNP (herniated nucleus pulposus), lumbar 04/29/2018   Advanced care planning/counseling discussion 09/28/2016   Spinal stenosis, lumbar region, 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/13/2016   Hyperlipidemia associated with type 2 diabetes mellitus (HCC) 07/14/2015   Symptomatic anemia 06/30/2015   Benign prostatic hyperplasia without lower urinary tract symptoms 06/02/2015   OSA (obstructive sleep apnea)  03/23/2015   Hypertension associated with diabetes (HCC) 09/28/2014   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HCC) 09/28/2014   H/O prior ablation treatment 10/19/2011   AF (paroxysmal atrial fibrillation) (HCC) 10/19/2011   PCP: Lemar Pyles, NP  REFERRING PROVIDER: Rosan Comfort, MD   REFERRING DIAG: Peripheral Neuropathy  RATIONALE FOR EVALUATION AND TREATMENT: Rehabilitation  THERAPY DIAG: Muscle weakness (generalized)  Difficulty in walking, not elsewhere classified  ONSET DATE: 01/25/23 (acute on chronic)  FOLLOW-UP APPT SCHEDULED WITH REFERRING PROVIDER: Yes   FROM INITIAL EVALUATION SUBJECTIVE:  SUBJECTIVE STATEMENT:  Imbalance and LE weakness;  PERTINENT HISTORY:  Lower extremity weakness in patient with 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 He reports weakness in both legs and numbness in toes post surgery in 2018. He experienced a fall on 01/25/2023 at home, resulting in a L hip injury. No residual pain in hip at this time. No additional falls since December. He reports progressive LE weakness impairing his functional ability at home. He is known to this clinic from prior episodes of care for similar concerns.  Prior history: 11/02/22 Patient reports to physical therapy today with a chief concern of muscle weakness and imbalance. Patient reports progressive worsening in balance and muscular strength. He states that he has loss of sensation in his toes he reports they  are "asleep". Patient reports pain in the anterior thigh and bilateral calves. He currently uses an loftstand crutch in his RUE. Lately patient has reported difficulty with dressing LE, performing fine motor movements with left hand. He denies falls, saddle parasthesia, nausea, vomitting, night sweats.    10/17/2022 Patient reported he is not doing well lately. He has been seeing Dr. Myer Haff. He reports his legs are so weak that he can hardly walk, so he has been using a crutch for the last 1-2 years. Reported numbness and loss of sensation in his toes. He also reported that recently his left hand has began to has some numbness to where it feels like it is asleep. Denies pain, loss of sensation, coldness, burning. Denies falls."  Imaging: NCS conducted on 11/12/2018: Abnormal study. 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 There is also preliminary evidence of a superimposed left lower lumbosacral polyradiculopathy, based on NCV and distal needle exam results. I cannot test lumbar paraspinals due to patient on Eliquis.    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 Yes. 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  Recent memory is intact.  Remote memory is intact.  Attention span and concentration are intact.  Expressive speech is intact.  Patient's fund of knowledge  is within normal limits for educational level.    Gross Musculoskeletal Assessment Tremor: None Bulk: Normal Tone: Normal  Posture: Forward head and rounded shoulders  AROM Deferred specific measurements. Functional motion intact;  LE MMT: MMT (out of 5) Right  Left   Hip flexion 4 4+  Hip extension    Hip abduction (seated) 3+ 3+  Hip adduction (seated 3+ 3+  Hip internal rotation    Hip external rotation    Knee flexion (seated) 4 4  Knee extension 4 4  Ankle dorsiflexion 4 4  Ankle plantarflexion    Ankle inversion    Ankle eversion    (* = pain; Blank rows = not tested)  Transfers: Assistive device utilized:  Single Lofstrand RUE   Sit to stand: Modified independence Stand to sit: Modified independence Chair to chair: Modified independence Floor:  Deferred  Stairs: Level of Assistance: CGA Stair Negotiation Technique: Alternating Pattern  with Bilateral Rails Number of Stairs: 4  Height of Stairs: 6"  Comments: Slow and labored ascending/descending. No overt LOB;  Gait: Gait pattern: decreased step length- Right, decreased step length- Left, trunk flexed, poor foot clearance- Right, and poor foot clearance- Left Distance walked: 150' over the course of evaluation Assistive device  utilized:  Single Lofstrand in RUE Level of assistance: Modified independence Comments: Slow and labored ambulation. Decreased self selected speed  Functional Outcome Measures  Results Comments  BERG 40/56 Increased fall risk  DGI    FGA    TUG 22.2 seconds Increased fall risk  5TSTS Unable Increased fall risk  6 Minute Walk Test    10 Meter Gait Speed Self-selected: 20.5s = 0.49 m/s; Fastest: 18.0s = 0.56 m/s Below community ambulation speed  (Blank rows = not tested)   TODAY'S TREATMENT    SUBJECTIVE: Pt reports that he is doing well today. No changes since the initial evaluation. Reports some R shin pain upon arrival today. No specific questions or concerns.    PAIN: R shin pain;   Ther-ex  NuStep (seat 12, hands 13) L1-3 x 10 minutes for BLE strengthening and warm-up during interval history (5 minutes unbilled); Sit to stand from elevate mat table 2 x 10, second set lower height (23"); 6" alternating step ups without UE support x 10 leading with each LE;  Standing hip strengthening with 3# ankle weights: Hip flexion forward walking marches x 10 BLE; HS curls x 10 BLE; Hip abduction x 10 BLE; Hip extension x 10 BLE;  Standing heel raises with BUE support x 10 BLE;  Seated LAQ with 3# ankle weights 2 x 10 BLE; Seated clams with green tband 2 x 10 BLE; Seated adductor ball squeezes 2 x 10 BLE;   PATIENT EDUCATION:  Education details: Plan of care and HEP; Person educated: Patient Education method: Explanation, Verbal cues, and Handouts Education comprehension: verbalized understanding and returned demonstration   HOME EXERCISE PROGRAM:  Access Code: [email or web address] URL: [email or web address] Date: 05/31/2023 Prepared by: Ria Comment  Exercises - Seated Long Arc Quad  - 1 x daily - 3-4 x weekly - 3 sets - 10 reps - 2s hold - Seated Hip Abduction with Resistance  - 1 x daily - 3-4 x weekly - 3 sets - 10 reps - 2s hold - Heel Raises with Counter  Support  - 1 x daily - 3-4 x weekly - 3 sets - 10 reps - 2s hold - Mini Squat with Counter Support  - 1 x daily - 3-4 x weekly -  3 sets - 10 reps - Semi-Tandem Balance at [employer] Eyes Open  - 1 x daily - 7 x weekly - 2-3 sets - 10 reps - 10-30 hold   ASSESSMENT:  CLINICAL IMPRESSION: Initiated strengthening exercises during session today with patient. Notable fatigue requiring seated rest breaks throughout session. Issued HEP and reviewed with patient. Pt encouraged to follow-up as scheduled. Will introduce balance exercises at future session. Pt will benefit from PT services to address deficits in strength, balance, and mobility in order to return to full function at home and decrease his risk for falls.    OBJECTIVE IMPAIRMENTS: Abnormal gait, decreased balance, difficulty walking, decreased strength, impaired sensation, and pain.   ACTIVITY LIMITATIONS: bending, standing, squatting, stairs, transfers, and caring for others  PARTICIPATION LIMITATIONS: meal prep, cleaning, laundry, shopping, and community activity  PERSONAL FACTORS: Age, Past/current experiences, Time since onset of injury/illness/exacerbation, and 3+ comorbidities: generalized polyneuropathy, COPD, DM, NSTEMI, and spinal stenosis  are also affecting patient's functional outcome.   REHAB POTENTIAL: Fair    CLINICAL DECISION MAKING: Unstable/unpredictable  EVALUATION COMPLEXITY: High   GOALS: Goals reviewed with patient? No  SHORT TERM GOALS: Target date: 07/10/2023  Pt will be independent with HEP in order to improve strength and balance in order to decrease fall risk and improve function at home. Baseline:  Goal status: INITIAL   LONG TERM GOALS: Target date: 08/21/2023  Pt will improve ABC by at least 13% in order to demonstrate clinically significant improvement in balance confidence.  Baseline: 20% Goal status: INITIAL  2.  Pt will improve BERG by at least 3 points in order to demonstrate clinically  significant improvement in balance.   Baseline: 40/56; Goal status: INITIAL  3.  Pt will increase self-selected by at least 0.13 m/s in order to demonstrate clinically significant improvement in community ambulation.         Baseline: self-selected: 0.49 m/s Goal status: INITIAL  4. Pt will be able to complete 5TSTS without UE support in order to demonstrate clinically significant improvement in LE strength      Baseline: Unable to perform sit to stand without heavy UE assist Goal status: INITIAL  5. Pt will decrease TUG to below 14 seconds/decrease in order to demonstrate decreased fall risk.  Baseline: 22.2s Goal status: INITIAL  PLAN: PT FREQUENCY: 2x/week  PT DURATION: 12 weeks  PLANNED INTERVENTIONS: Therapeutic exercises, Therapeutic activity, Neuromuscular re-education, Balance training, Gait training, Patient/Family education, Self Care, Joint mobilization, Joint manipulation, Vestibular training, Canalith repositioning, Orthotic/Fit training, DME instructions, Dry Needling, Electrical stimulation, Spinal manipulation, Spinal mobilization, Cryotherapy, Moist heat, Taping, Traction, Ultrasound, Ionotophoresis 4mg /ml Dexamethasone, Manual therapy, and Re-evaluation.  PLAN FOR NEXT SESSION: Progress strengthening and introduce balance exercises, modify/progress HEP as needed;    Shaheim Mahar D Walker Sitar PT, DPT, GCS  Arial Galligan 05/31/2023, 12:04 PM

## 2023-05-29 NOTE — Therapy (Addendum)
 OUTPATIENT PHYSICAL THERAPY BALANCE EVALUATION   Patient Name: Sean Reyes. MRN: [id number] DOB:Jun 10, 1940, 83 y.o., male Today's Date: 05/29/2023  END OF SESSION:  PT End of Session - 05/29/23 1054     Visit Number 1    Number of Visits 25    Date for PT Re-Evaluation 08/21/23    Authorization Type eval: 05/29/23;    Authorization Time Period Medicare A&B 2025  VL: Based on MN  No auth req    PT Start Time 1100    PT Stop Time 1145    PT Time Calculation (min) 45 min    Equipment Utilized During Treatment Gait belt    Activity Tolerance Patient tolerated treatment well;Patient limited by pain    Behavior During Therapy WFL for tasks assessed/performed            Past Medical History:  Diagnosis Date   Anemia    Anxiety    Arthritis    Arthritis of neck    Atrial fibrillation (HCC)    Cataracts, bilateral    Complication of anesthesia    pt reports low BP's after surgery at [facility] and difficulty awakening   Depression    Diabetes (HCC)    dx 6-8 yrs ago   Dysrhythmia    a-fib   GERD (gastroesophageal reflux disease)    OCC TAKES ALKA SELTZER   History of kidney stones    10-15 yrs ago   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   Hyperlipidemia    Hypertension    Myocardial infarction (HCC) 12/2020   Nocturia    S/P ablation of atrial fibrillation    Ablative therapy   Sleep apnea    CPAP    Spinal stenosis    Tachycardia, unspecified    Past Surgical History:  Procedure Laterality Date   ABLATION     ANTERIOR LAT LUMBAR FUSION N/A 06/27/2017   Procedure: Anterior Lateral Lumbar Interbody  Fusion - Lumbar Two-Lumbar Three - Lumbar Three-Lumbar Four, Posterior Lumbar Interbody Fusion Lumbar Four- Five;  Surgeon: Donalee Citrin, MD;  Location: [facility] OR;  Service: Neurosurgery;  Laterality: N/A;  Anterior Lateral Lumbar Interbody  Fusion - Lumbar Two-Lumbar Three - Lumbar Three-Lumbar Four, Posterior Lumbar Interbody Fusion Lumbar Four- Five    BACK SURGERY     CARDIAC CATHETERIZATION     CARDIOVERSION N/A 08/29/2018   Procedure: CARDIOVERSION;  Surgeon: Lamar Blinks, MD;  Location: [facility] ORS;  Service: Cardiovascular;  Laterality: N/A;   CARDIOVERSION N/A 09/24/2018   Procedure: CARDIOVERSION;  Surgeon: Lamar Blinks, MD;  Location: [facility] ORS;  Service: Cardiovascular;  Laterality: N/A;   COLONOSCOPY WITH PROPOFOL N/A 10/05/2015   Procedure: COLONOSCOPY WITH PROPOFOL;  Surgeon: Christena Deem, MD;  Location: [facility] ENDOSCOPY;  Service: Endoscopy;  Laterality: N/A;   COLONOSCOPY WITH PROPOFOL N/A 11/01/2020   Procedure: COLONOSCOPY WITH PROPOFOL;  Surgeon: Wyline Mood, MD;  Location: [facility] ENDOSCOPY;  Service: Gastroenterology;  Laterality: N/A;   CORONARY STENT INTERVENTION N/A 12/22/2020   Procedure: CORONARY STENT INTERVENTION;  Surgeon: Marcina Millard, MD;  Location: [facility] INVASIVE CV LAB;  Service: Cardiovascular;  Laterality: N/A;   ESOPHAGOGASTRODUODENOSCOPY N/A 11/01/2020   Procedure: ESOPHAGOGASTRODUODENOSCOPY (EGD);  Surgeon: Wyline Mood, MD;  Location: [facility] ENDOSCOPY;  Service: Gastroenterology;  Laterality: N/A;   ESOPHAGOGASTRODUODENOSCOPY (EGD) WITH PROPOFOL N/A 04/01/2018   Procedure: ESOPHAGOGASTRODUODENOSCOPY (EGD) WITH PROPOFOL;  Surgeon: Christena Deem, MD;  Location: [facility] ENDOSCOPY;  Service: Endoscopy;  Laterality: N/A;   ESOPHAGOGASTRODUODENOSCOPY (  EGD) WITH PROPOFOL N/A 06/01/2022   Procedure: ESOPHAGOGASTRODUODENOSCOPY (EGD) WITH PROPOFOL;  Surgeon: Luke Salaam, MD;  Location: [facility] ENDOSCOPY;  Service: Gastroenterology;  Laterality: N/A;   EYE SURGERY     HERNIA REPAIR     JOINT REPLACEMENT Bilateral    hips  RT+  LEFT X2    LEFT HEART CATH AND CORONARY ANGIOGRAPHY N/A 12/22/2020   Procedure: LEFT HEART CATH AND CORONARY ANGIOGRAPHY;  Surgeon: Percival Brace, MD;  Location: [facility] INVASIVE CV LAB;  Service: Cardiovascular;  Laterality: N/A;   LEFT HEART CATH AND CORONARY ANGIOGRAPHY Left  08/23/2021   Procedure: LEFT HEART CATH AND CORONARY ANGIOGRAPHY;  Surgeon: Michelle Aid, MD;  Location: [facility] INVASIVE CV LAB;  Service: Cardiovascular;  Laterality: Left;   LUMBAR LAMINECTOMY/DECOMPRESSION MICRODISCECTOMY Left 09/13/2016   Procedure: Microdiscectomy - Lumbar two-three,  Lumbar three- - left;  Surgeon: Gearl Keens, MD;  Location: [facility] OR;  Service: Neurosurgery;  Laterality: Left;   SPINAL CORD STIMULATOR INSERTION  07/08/2019   TONSILLECTOMY     Patient Active Problem List   Diagnosis Date Noted   Diabetes mellitus treated with oral medication (HCC) 04/05/2023   Finger numbness 10/13/2022   Subareolar mass of left breast 10/03/2022   COPD (chronic obstructive pulmonary disease) (HCC) 10/31/2021   Shortness of breath on exertion 08/04/2021   Diabetes mellitus with proteinuria (HCC) 04/04/2021   GERD without esophagitis 04/02/2021   Coronary artery disease 12/28/2020   History of non-ST elevation myocardial infarction (NSTEMI) 12/21/2020   Pain in right shin 10/25/2020   Peripheral vascular disease (HCC) 08/06/2020   Chronic pain syndrome 07/06/2020   Cervical facet joint syndrome 04/08/2020   Spinal cord stimulator status 12/11/2019   Elevated TSH 08/20/2019   CKD (chronic kidney disease) stage 3, GFR 30-59 ml/min (HCC) 01/19/2019   Acquired thrombophilia (HCC) 01/19/2019   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   Postlaminectomy syndrome, lumbar region 01/16/2019   History of fusion of lumbar spine (L2-L5) 01/16/2019   Chronic radicular lumbar pain 01/16/2019   HNP (herniated nucleus pulposus), lumbar 04/29/2018   Advanced care planning/counseling discussion 09/28/2016   Spinal stenosis, lumbar region, 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/13/2016   Hyperlipidemia associated with type 2 diabetes mellitus (HCC) 07/14/2015   Symptomatic anemia 06/30/2015   Benign prostatic hyperplasia without lower urinary tract symptoms 06/02/2015   OSA (obstructive sleep apnea)  03/23/2015   Hypertension associated with diabetes (HCC) 09/28/2014   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HCC) 09/28/2014   H/O prior ablation treatment 10/19/2011   AF (paroxysmal atrial fibrillation) (HCC) 10/19/2011   PCP: Lemar Pyles, NP  REFERRING PROVIDER: Rosan Comfort, MD   REFERRING DIAG: Peripheral Neuropathy  RATIONALE FOR EVALUATION AND TREATMENT: Rehabilitation  THERAPY DIAG: Muscle weakness (generalized)  Difficulty in walking, not elsewhere classified  ONSET DATE: 01/25/23 (acute on chronic)  FOLLOW-UP APPT SCHEDULED WITH REFERRING PROVIDER: Yes    SUBJECTIVE:  SUBJECTIVE STATEMENT:  Imbalance and LE weakness;  PERTINENT HISTORY:  Lower extremity weakness in patient with 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 He reports weakness in both legs and numbness in toes post surgery in 2018. He experienced a fall on 01/25/2023 at home, resulting in a L hip injury. No residual pain in hip at this time. No additional falls since December. He reports progressive LE weakness impairing his functional ability at home. He is known to this clinic from prior episodes of care for similar concerns.  Prior history: 11/02/22 Patient reports to physical therapy today with a chief concern of muscle weakness and imbalance. Patient reports progressive worsening in balance and muscular strength. He states that he has loss of sensation in his toes he reports they are "asleep". Patient  reports pain in the anterior thigh and bilateral calves. He currently uses an loftstand crutch in his RUE. Lately patient has reported difficulty with dressing LE, performing fine motor movements with left hand. He denies falls, saddle parasthesia, nausea, vomitting, night sweats.    10/17/2022 Patient reported he is not doing well lately. He has been seeing Dr. Mont Antis. He reports his legs are so weak that he can hardly walk, so he has been using a crutch for the last 1-2 years. Reported numbness and loss of sensation in his toes. He also reported that recently his left hand has began to has some numbness to where it feels like it is asleep. Denies pain, loss of sensation, coldness, burning. Denies falls."  Imaging: NCS conducted on 11/12/2018: Abnormal study. 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 There is also preliminary evidence of a superimposed left lower lumbosacral polyradiculopathy, based on NCV and distal needle exam results. I cannot test lumbar paraspinals due to patient on Eliquis.    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 Yes. 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  Recent memory is intact.  Remote memory is intact.  Attention span and concentration are intact.  Expressive speech is intact.  Patient's fund of knowledge  is within normal limits for educational level.    Gross Musculoskeletal Assessment Tremor: None Bulk: Normal Tone: Normal  Posture: Forward head and rounded shoulders  AROM Deferred specific measurements. 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  Reflexes Deferred  Cranial Nerves Deferred  Coordination/Cerebellar Deferred  Bed mobility: Deferred  Transfers: Assistive device utilized:  Single Lofstrand RUE   Sit to stand: Modified independence Stand to sit: Modified independence Chair to chair: Modified independence Floor:  Deferred  Curb:  Deferred  Stairs: Level of Assistance: CGA Stair Negotiation Technique: Alternating Pattern  with Bilateral Rails Number of Stairs: 4  Height of Stairs: 6"  Comments: Slow and labored ascending/descending. No overt LOB;  Gait: Gait pattern:  decreased step length- Right, decreased step length- Left, trunk flexed, poor foot clearance- Right, and poor foot clearance- Left Distance walked: 150' over the course of evaluation Assistive device utilized:  Single Lofstrand in RUE Level of assistance: Modified independence Comments: Slow and labored ambulation. Decreased self selected speed  Functional Outcome Measures  Results Comments  BERG 40/56 Increased fall risk  DGI    FGA    TUG 22.2 seconds Increased fall risk  5TSTS Unable Increased fall risk  6 Minute Walk Test    10 Meter Gait Speed Self-selected: 20.5s = 0.49 m/s; Fastest: 18.0s = 0.56 m/s Below community ambulation speed  (Blank rows = not tested)   TODAY'S TREATMENT  Deferred   PATIENT EDUCATION:  Education details: Plan of care and examination findings Person educated: Patient Education method: Explanation Education comprehension: verbalized understanding   HOME EXERCISE PROGRAM:  From prior episode (to be issued at first follow-up): Access Code: [email or web address] URL: [email or web address] Date: 11/02/2022 Prepared by: Ria Comment   Exercises - Seated Long Arc Quad  - 1 x daily - 7 x weekly - 1-3 sets - 10 reps - Heel Raises with Counter Support  - 1 x daily - 7 x weekly - 1-3 sets - 10 reps - Semi-Tandem Balance at [employer] Eyes Open  - 1 x daily - 7 x weekly - 2-3 sets - 10 reps - 10-30 hold  ASSESSMENT:  CLINICAL IMPRESSION: Patient is a pleasant 83 y.o. male who was seen today for physical therapy evaluation and treatment for balance. He is known to this clinic from prior episodes of care for similar imbalance and LE weakness. Objectively patient scored 40/56 on Berg balance indicating he is at high risk of falls. Patient also demonstrated decreased self-selected walking speed (.49 m/s) and decreased LE strength. He is unable to perform a sit to stand without considerable UE assistance. His most notable decline since the last episode of  therapy is his progressive BLE weakness. Walking pattern impaired due to generalized polyneuropathy and weakness in BLE. Currently, he presents with poor balance, endurance and decreased LE strength thus placing him at a high risk for falls. Based on today's performance patient would benefit from skilled physical therapy focused on improving LE strength  and balance in order to demonstrate decreased fall risk improved functional mobility.   OBJECTIVE IMPAIRMENTS: Abnormal gait, decreased balance, difficulty walking, decreased strength, impaired sensation, and pain.   ACTIVITY LIMITATIONS: bending, standing, squatting, stairs, transfers, and caring for others  PARTICIPATION LIMITATIONS: meal prep, cleaning, laundry, shopping, and community activity  PERSONAL FACTORS: Age, Past/current experiences, Time since onset of injury/illness/exacerbation, and 3+ comorbidities: generalized polyneuropathy, COPD, DM, NSTEMI, and spinal stenosis  are also affecting patient's functional outcome.   REHAB POTENTIAL: Fair    CLINICAL DECISION MAKING: Unstable/unpredictable  EVALUATION COMPLEXITY: High   GOALS: Goals reviewed with patient? No  SHORT TERM GOALS: Target date: 07/10/2023  Pt will be independent with HEP in order to improve strength and balance in order to decrease fall risk and improve function at home. Baseline:  Goal status: INITIAL   LONG TERM GOALS: Target date: 08/21/2023  Pt will improve ABC by at least 13% in order to demonstrate clinically significant improvement in balance confidence.  Baseline: 20% Goal status: INITIAL  2.  Pt will improve BERG by at least 3 points in order to demonstrate clinically significant improvement in balance.   Baseline: 40/56; Goal status: INITIAL  3.  Pt will increase self-selected by at least 0.13 m/s in order to demonstrate clinically significant improvement in community ambulation.         Baseline: self-selected: 0.49 m/s Goal status:  INITIAL  4. Pt will be able to complete 5TSTS without UE support in order to demonstrate clinically significant improvement in LE strength      Baseline: Unable to perform sit to stand without heavy UE assist Goal status: INITIAL  5. Pt will decrease TUG to below 14 seconds/decrease in order to demonstrate decreased fall risk.  Baseline: 22.2s Goal status: INITIAL  PLAN: PT FREQUENCY: 2x/week  PT DURATION: 12 weeks  PLANNED INTERVENTIONS: Therapeutic exercises, Therapeutic activity, Neuromuscular re-education, Balance training, Gait training, Patient/Family education, Self Care, Joint mobilization, Joint manipulation, Vestibular training, Canalith repositioning, Orthotic/Fit training, DME instructions, Dry Needling, Electrical stimulation, Spinal manipulation, Spinal mobilization, Cryotherapy, Moist heat, Taping, Traction, Ultrasound, Ionotophoresis 4mg /ml Dexamethasone, Manual therapy, and Re-evaluation.  PLAN FOR NEXT SESSION: Introduce strengthening and balance exercises, issue HEP;    Sherill Ding Chaslyn Eisen PT, DPT, GCS  Halsey Persaud 05/29/2023, 11:48 AM

## 2023-05-31 ENCOUNTER — Ambulatory Visit

## 2023-05-31 DIAGNOSIS — R262 Difficulty in walking, not elsewhere classified: Secondary | ICD-10-CM

## 2023-05-31 DIAGNOSIS — M6281 Muscle weakness (generalized): Secondary | ICD-10-CM | POA: Diagnosis not present

## 2023-06-02 NOTE — Therapy (Signed)
 OUTPATIENT PHYSICAL THERAPY BALANCE TREATMENT   Patient Name: Sean Reyes. MRN: [id number] DOB:Jul 24, 1940, 83 y.o., male Today's Date: 06/05/2023  END OF SESSION:  PT End of Session - 06/05/23 1115     Visit Number 3    Number of Visits 25    Date for PT Re-Evaluation 08/21/23    Authorization Type eval: 05/29/23;    Authorization Time Period Medicare A&B 2025  VL: Based on MN  No auth req    PT Start Time 1102    PT Stop Time 1145    PT Time Calculation (min) 43 min    Equipment Utilized During Treatment Gait belt    Activity Tolerance Patient tolerated treatment well;Patient limited by pain    Behavior During Therapy WFL for tasks assessed/performed             Past Medical History:  Diagnosis Date   Anemia    Anxiety    Arthritis    Arthritis of neck    Atrial fibrillation (HCC)    Cataracts, bilateral    Complication of anesthesia    pt reports low BP's after surgery at [facility] and difficulty awakening   Depression    Diabetes (HCC)    dx 6-8 yrs ago   Dysrhythmia    a-fib   GERD (gastroesophageal reflux disease)    OCC TAKES ALKA SELTZER   History of kidney stones    10-15 yrs ago   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   Hyperlipidemia    Hypertension    Myocardial infarction (HCC) 12/2020   Nocturia    S/P ablation of atrial fibrillation    Ablative therapy   Sleep apnea    CPAP    Spinal stenosis    Tachycardia, unspecified    Past Surgical History:  Procedure Laterality Date   ABLATION     ANTERIOR LAT LUMBAR FUSION N/A 06/27/2017   Procedure: Anterior Lateral Lumbar Interbody  Fusion - Lumbar Two-Lumbar Three - Lumbar Three-Lumbar Four, Posterior Lumbar Interbody Fusion Lumbar Four- Five;  Surgeon: Gearl Keens, MD;  Location: [facility] OR;  Service: Neurosurgery;  Laterality: N/A;  Anterior Lateral Lumbar Interbody  Fusion - Lumbar Two-Lumbar Three - Lumbar Three-Lumbar Four, Posterior Lumbar Interbody Fusion Lumbar Four- Five    BACK SURGERY     CARDIAC CATHETERIZATION     CARDIOVERSION N/A 08/29/2018   Procedure: CARDIOVERSION;  Surgeon: Michelle Aid, MD;  Location: [facility] ORS;  Service: Cardiovascular;  Laterality: N/A;   CARDIOVERSION N/A 09/24/2018   Procedure: CARDIOVERSION;  Surgeon: Michelle Aid, MD;  Location: [facility] ORS;  Service: Cardiovascular;  Laterality: N/A;   COLONOSCOPY WITH PROPOFOL  N/A 10/05/2015   Procedure: COLONOSCOPY WITH PROPOFOL ;  Surgeon: Deveron Fly, MD;  Location: [facility] ENDOSCOPY;  Service: Endoscopy;  Laterality: N/A;   COLONOSCOPY WITH PROPOFOL  N/A 11/01/2020   Procedure: COLONOSCOPY WITH PROPOFOL ;  Surgeon: Luke Salaam, MD;  Location: [facility] ENDOSCOPY;  Service: Gastroenterology;  Laterality: N/A;   CORONARY STENT INTERVENTION N/A 12/22/2020   Procedure: CORONARY STENT INTERVENTION;  Surgeon: Percival Brace, MD;  Location: [facility] INVASIVE CV LAB;  Service: Cardiovascular;  Laterality: N/A;   ESOPHAGOGASTRODUODENOSCOPY N/A 11/01/2020   Procedure: ESOPHAGOGASTRODUODENOSCOPY (EGD);  Surgeon: Luke Salaam, MD;  Location: [facility] ENDOSCOPY;  Service: Gastroenterology;  Laterality: N/A;   ESOPHAGOGASTRODUODENOSCOPY (EGD) WITH PROPOFOL  N/A 04/01/2018   Procedure: ESOPHAGOGASTRODUODENOSCOPY (EGD) WITH PROPOFOL ;  Surgeon: Deveron Fly, MD;  Location: [facility] ENDOSCOPY;  Service: Endoscopy;  Laterality: N/A;  ESOPHAGOGASTRODUODENOSCOPY (EGD) WITH PROPOFOL  N/A 06/01/2022   Procedure: ESOPHAGOGASTRODUODENOSCOPY (EGD) WITH PROPOFOL ;  Surgeon: Luke Salaam, MD;  Location: [facility] ENDOSCOPY;  Service: Gastroenterology;  Laterality: N/A;   EYE SURGERY     HERNIA REPAIR     JOINT REPLACEMENT Bilateral    hips  RT+  LEFT X2    LEFT HEART CATH AND CORONARY ANGIOGRAPHY N/A 12/22/2020   Procedure: LEFT HEART CATH AND CORONARY ANGIOGRAPHY;  Surgeon: Percival Brace, MD;  Location: [facility] INVASIVE CV LAB;  Service: Cardiovascular;  Laterality: N/A;   LEFT HEART CATH AND CORONARY ANGIOGRAPHY Left  08/23/2021   Procedure: LEFT HEART CATH AND CORONARY ANGIOGRAPHY;  Surgeon: Michelle Aid, MD;  Location: [facility] INVASIVE CV LAB;  Service: Cardiovascular;  Laterality: Left;   LUMBAR LAMINECTOMY/DECOMPRESSION MICRODISCECTOMY Left 09/13/2016   Procedure: Microdiscectomy - Lumbar two-three,  Lumbar three- - left;  Surgeon: Gearl Keens, MD;  Location: [facility] OR;  Service: Neurosurgery;  Laterality: Left;   SPINAL CORD STIMULATOR INSERTION  07/08/2019   TONSILLECTOMY     Patient Active Problem List   Diagnosis Date Noted   Diabetes mellitus treated with oral medication (HCC) 04/05/2023   Finger numbness 10/13/2022   Subareolar mass of left breast 10/03/2022   COPD (chronic obstructive pulmonary disease) (HCC) 10/31/2021   Shortness of breath on exertion 08/04/2021   Diabetes mellitus with proteinuria (HCC) 04/04/2021   GERD without esophagitis 04/02/2021   Coronary artery disease 12/28/2020   History of non-ST elevation myocardial infarction (NSTEMI) 12/21/2020   Pain in right shin 10/25/2020   Peripheral vascular disease (HCC) 08/06/2020   Chronic pain syndrome 07/06/2020   Cervical facet joint syndrome 04/08/2020   Spinal cord stimulator status 12/11/2019   Elevated TSH 08/20/2019   CKD (chronic kidney disease) stage 3, GFR 30-59 ml/min (HCC) 01/19/2019   Acquired thrombophilia (HCC) 01/19/2019   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   Postlaminectomy syndrome, lumbar region 01/16/2019   History of fusion of lumbar spine (L2-L5) 01/16/2019   Chronic radicular lumbar pain 01/16/2019   HNP (herniated nucleus pulposus), lumbar 04/29/2018   Advanced care planning/counseling discussion 09/28/2016   Spinal stenosis, lumbar region, 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/13/2016   Hyperlipidemia associated with type 2 diabetes mellitus (HCC) 07/14/2015   Symptomatic anemia 06/30/2015   Benign prostatic hyperplasia without lower urinary tract symptoms 06/02/2015   OSA (obstructive sleep apnea)  03/23/2015   Hypertension associated with diabetes (HCC) 09/28/2014   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HCC) 09/28/2014   H/O prior ablation treatment 10/19/2011   AF (paroxysmal atrial fibrillation) (HCC) 10/19/2011   PCP: Lemar Pyles, NP  REFERRING PROVIDER: Rosan Comfort, MD   REFERRING DIAG: Peripheral Neuropathy  RATIONALE FOR EVALUATION AND TREATMENT: Rehabilitation  THERAPY DIAG: Muscle weakness (generalized)  Difficulty in walking, not elsewhere classified  ONSET DATE: 01/25/23 (acute on chronic)  FOLLOW-UP APPT SCHEDULED WITH REFERRING PROVIDER: Yes   FROM INITIAL EVALUATION SUBJECTIVE:  SUBJECTIVE STATEMENT:  Imbalance and LE weakness;  PERTINENT HISTORY:  Lower extremity weakness in patient with 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 He reports weakness in both legs and numbness in toes post surgery in 2018. He experienced a fall on 01/25/2023 at home, resulting in a L hip injury. No residual pain in hip at this time. No additional falls since December. He reports progressive LE weakness impairing his functional ability at home. He is known to this clinic from prior episodes of care for similar concerns.  Prior history: 11/02/22 Patient reports to physical therapy today with a chief concern of muscle weakness and imbalance. Patient reports progressive worsening in balance and muscular strength. He states that he has loss of sensation in his toes he reports they  are "asleep". Patient reports pain in the anterior thigh and bilateral calves. He currently uses an loftstand crutch in his RUE. Lately patient has reported difficulty with dressing LE, performing fine motor movements with left hand. He denies falls, saddle parasthesia, nausea, vomitting, night sweats.    10/17/2022 Patient reported he is not doing well lately. He has been seeing Dr. Mont Antis. He reports his legs are so weak that he can hardly walk, so he has been using a crutch for the last 1-2 years. Reported numbness and loss of sensation in his toes. He also reported that recently his left hand has began to has some numbness to where it feels like it is asleep. Denies pain, loss of sensation, coldness, burning. Denies falls."  Imaging: NCS conducted on 11/12/2018: Abnormal study. 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 There is also preliminary evidence of a superimposed left lower lumbosacral polyradiculopathy, based on NCV and distal needle exam results. I cannot test lumbar paraspinals due to patient on Eliquis .    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 Yes. 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  Recent memory is intact.  Remote memory is intact.  Attention span and concentration are intact.  Expressive speech is intact.  Patient's fund of knowledge  is within normal limits for educational level.    Gross Musculoskeletal Assessment Tremor: None Bulk: Normal Tone: Normal  Posture: Forward head and rounded shoulders  AROM Deferred specific measurements. Functional motion intact;  LE MMT: MMT (out of 5) Right  Left   Hip flexion 4 4+  Hip extension    Hip abduction (seated) 3+ 3+  Hip adduction (seated 3+ 3+  Hip internal rotation    Hip external rotation    Knee flexion (seated) 4 4  Knee extension 4 4  Ankle dorsiflexion 4 4  Ankle plantarflexion    Ankle inversion    Ankle eversion    (* = pain; Blank rows = not tested)  Transfers: Assistive device utilized:  Single Lofstrand RUE   Sit to stand: Modified independence Stand to sit: Modified independence Chair to chair: Modified independence Floor:  Deferred  Stairs: Level of Assistance: CGA Stair Negotiation Technique: Alternating Pattern  with Bilateral Rails Number of Stairs: 4  Height of Stairs: 6"  Comments: Slow and labored ascending/descending. No overt LOB;  Gait: Gait pattern: decreased step length- Right, decreased step length- Left, trunk flexed, poor foot clearance- Right, and poor foot clearance- Left Distance walked: 150' over the course of evaluation Assistive device  utilized:  Single Lofstrand in RUE Level of assistance: Modified independence Comments: Slow and labored ambulation. Decreased self selected speed  Functional Outcome Measures  Results Comments  BERG 40/56 Increased fall risk  DGI    FGA    TUG 22.2 seconds Increased fall risk  5TSTS Unable Increased fall risk  6 Minute Walk Test    10 Meter Gait Speed Self-selected: 20.5s = 0.49 m/s; Fastest: 18.0s = 0.56 m/s Below community ambulation speed  (Blank rows = not tested)   TODAY'S TREATMENT    SUBJECTIVE: Pt reports that he is doing alright today. He is fatigued from a very busy weekend. No significant changes since the last therapy session. Ongoing R shin pain upon arrival today. No specific questions or concerns.    PAIN: R shin pain;   Ther-ex  NuStep (seat 12, hands 13) L1-3 x 10 minutes for BLE strengthening and warm-up during interval history (5 minutes unbilled); Sit to stand from regular height chair with Airex pad on seat 2 x 10;  Standing hip strengthening with 3# ankle weights: Hip flexion forward walking marches x 10 BLE; HS curls x 10 BLE; Hip abduction x 10 BLE; Hip extension x 10 BLE;  Side stepping with 3# ankle weights x multiple lengths; Seated LAQ with 3# ankle weights 2 x 10 BLE;   Not performed: 6" alternating step ups without UE support x 10 leading with each LE; Seated clams with green tband 2 x 10 BLE; Seated adductor ball squeezes 2 x 10 BLE;   PATIENT EDUCATION:  Education details: Plan of care and HEP; Person educated: Patient Education method: Explanation, Verbal cues, and Handouts Education comprehension: verbalized understanding and returned demonstration   HOME EXERCISE PROGRAM:  Access Code: [email or web address] URL: [email or web address] Date: 05/31/2023 Prepared by: Crawford Dock  Exercises - Seated Long Arc Quad  - 1 x daily - 3-4 x weekly - 3 sets - 10 reps - 2s hold - Seated Hip Abduction with Resistance  - 1 x daily -  3-4 x weekly - 3 sets - 10 reps - 2s hold - Heel Raises with Counter Support  - 1 x daily - 3-4 x weekly - 3 sets - 10 reps - 2s hold - Mini Squat with  Counter Support  - 1 x daily - 3-4 x weekly - 3 sets - 10 reps - Semi-Tandem Balance at [employer] Eyes Open  - 1 x daily - 7 x weekly - 2-3 sets - 10 reps - 10-30 hold   ASSESSMENT:  CLINICAL IMPRESSION: Progressed strengthening exercises during session today with patient. More fatigue today compared to prior sessions requiring seated rest breaks throughout appointment. No HEP modifications at this time. Pt encouraged to follow-up as scheduled. Will introduce balance exercises at future session. Pt will benefit from PT services to address deficits in strength, balance, and mobility in order to return to full function at home and decrease his risk for falls.    OBJECTIVE IMPAIRMENTS: Abnormal gait, decreased balance, difficulty walking, decreased strength, impaired sensation, and pain.   ACTIVITY LIMITATIONS: bending, standing, squatting, stairs, transfers, and caring for others  PARTICIPATION LIMITATIONS: meal prep, cleaning, laundry, shopping, and community activity  PERSONAL FACTORS: Age, Past/current experiences, Time since onset of injury/illness/exacerbation, and 3+ comorbidities: generalized polyneuropathy, COPD, DM, NSTEMI, and spinal stenosis  are also affecting patient's functional outcome.   REHAB POTENTIAL: Fair    CLINICAL DECISION MAKING: Unstable/unpredictable  EVALUATION COMPLEXITY: High   GOALS: Goals reviewed with patient? No  SHORT TERM GOALS: Target date: 07/10/2023  Pt will be independent with HEP in order to improve strength and balance in order to decrease fall risk and improve function at home. Baseline:  Goal status: INITIAL   LONG TERM GOALS: Target date: 08/21/2023  Pt will improve ABC by at least 13% in order to demonstrate clinically significant improvement in balance confidence.  Baseline: 20% Goal  status: INITIAL  2.  Pt will improve BERG by at least 3 points in order to demonstrate clinically significant improvement in balance.   Baseline: 40/56; Goal status: INITIAL  3.  Pt will increase self-selected by at least 0.13 m/s in order to demonstrate clinically significant improvement in community ambulation.         Baseline: self-selected: 0.49 m/s Goal status: INITIAL  4. Pt will be able to complete 5TSTS without UE support in order to demonstrate clinically significant improvement in LE strength      Baseline: Unable to perform sit to stand without heavy UE assist Goal status: INITIAL  5. Pt will decrease TUG to below 14 seconds/decrease in order to demonstrate decreased fall risk.  Baseline: 22.2s Goal status: INITIAL  PLAN: PT FREQUENCY: 2x/week  PT DURATION: 12 weeks  PLANNED INTERVENTIONS: Therapeutic exercises, Therapeutic activity, Neuromuscular re-education, Balance training, Gait training, Patient/Family education, Self Care, Joint mobilization, Joint manipulation, Vestibular training, Canalith repositioning, Orthotic/Fit training, DME instructions, Dry Needling, Electrical stimulation, Spinal manipulation, Spinal mobilization, Cryotherapy, Moist heat, Taping, Traction, Ultrasound, Ionotophoresis 4mg /ml Dexamethasone , Manual therapy, and Re-evaluation.  PLAN FOR NEXT SESSION: Progress strengthening and introduce balance exercises, modify/progress HEP as needed;    Cherlyn Syring D Jamerius Boeckman PT, DPT, GCS  Duan Scharnhorst 06/05/2023, 10:17 PM

## 2023-06-05 ENCOUNTER — Ambulatory Visit

## 2023-06-05 DIAGNOSIS — R262 Difficulty in walking, not elsewhere classified: Secondary | ICD-10-CM | POA: Diagnosis not present

## 2023-06-05 DIAGNOSIS — M6281 Muscle weakness (generalized): Secondary | ICD-10-CM | POA: Diagnosis not present

## 2023-06-07 ENCOUNTER — Encounter

## 2023-06-12 ENCOUNTER — Ambulatory Visit

## 2023-06-12 DIAGNOSIS — M6281 Muscle weakness (generalized): Secondary | ICD-10-CM

## 2023-06-12 DIAGNOSIS — R262 Difficulty in walking, not elsewhere classified: Secondary | ICD-10-CM | POA: Diagnosis not present

## 2023-06-12 NOTE — Therapy (Signed)
 OUTPATIENT PHYSICAL THERAPY BALANCE TREATMENT   Patient Name: Sean Reyes. MRN: [id number] DOB:September 01, 1940, 83 y.o., male Today's Date: 06/12/2023  END OF SESSION:  PT End of Session - 06/12/23 1152     Visit Number 4    Number of Visits 25    Date for PT Re-Evaluation 08/21/23    Authorization Type eval: 05/29/23;    Authorization Time Period Medicare A&B 2025  VL: Based on [place]  No auth req    PT Start Time 1145    PT Stop Time 1230    PT Time Calculation (min) 45 min    Equipment Utilized During Treatment Gait belt    Activity Tolerance Patient tolerated treatment well;Patient limited by pain    Behavior During Therapy WFL for tasks assessed/performed             Past Medical History:  Diagnosis Date   Anemia    Anxiety    Arthritis    Arthritis of neck    Atrial fibrillation (HCC)    Cataracts, bilateral    Complication of anesthesia    pt reports low BP's after surgery at [facility] and difficulty awakening   Depression    Diabetes (HCC)    dx 6-8 yrs ago   Dysrhythmia    a-fib   GERD (gastroesophageal reflux disease)    OCC TAKES ALKA SELTZER   History of kidney stones    10-15 yrs ago   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   Hyperlipidemia    Hypertension    Myocardial infarction (HCC) 12/2020   Nocturia    S/P ablation of atrial fibrillation    Ablative therapy   Sleep apnea    CPAP    Spinal stenosis    Tachycardia, unspecified    Past Surgical History:  Procedure Laterality Date   ABLATION     ANTERIOR LAT LUMBAR FUSION N/A 06/27/2017   Procedure: Anterior Lateral Lumbar Interbody  Fusion - Lumbar Two-Lumbar Three - Lumbar Three-Lumbar Four, Posterior Lumbar Interbody Fusion Lumbar Four- Five;  Surgeon: Gearl Keens, MD;  Location: [facility] OR;  Service: Neurosurgery;  Laterality: N/A;  Anterior Lateral Lumbar Interbody  Fusion - Lumbar Two-Lumbar Three - Lumbar Three-Lumbar Four, Posterior Lumbar Interbody Fusion Lumbar Four- Five    BACK SURGERY     CARDIAC CATHETERIZATION     CARDIOVERSION N/A 08/29/2018   Procedure: CARDIOVERSION;  Surgeon: Michelle Aid, MD;  Location: [facility] ORS;  Service: Cardiovascular;  Laterality: N/A;   CARDIOVERSION N/A 09/24/2018   Procedure: CARDIOVERSION;  Surgeon: Michelle Aid, MD;  Location: [facility] ORS;  Service: Cardiovascular;  Laterality: N/A;   COLONOSCOPY WITH PROPOFOL  N/A 10/05/2015   Procedure: COLONOSCOPY WITH PROPOFOL ;  Surgeon: Deveron Fly, MD;  Location: [facility] ENDOSCOPY;  Service: Endoscopy;  Laterality: N/A;   COLONOSCOPY WITH PROPOFOL  N/A 11/01/2020   Procedure: COLONOSCOPY WITH PROPOFOL ;  Surgeon: Luke Salaam, MD;  Location: [facility] ENDOSCOPY;  Service: Gastroenterology;  Laterality: N/A;   CORONARY STENT INTERVENTION N/A 12/22/2020   Procedure: CORONARY STENT INTERVENTION;  Surgeon: Percival Brace, MD;  Location: [facility] INVASIVE CV LAB;  Service: Cardiovascular;  Laterality: N/A;   ESOPHAGOGASTRODUODENOSCOPY N/A 11/01/2020   Procedure: ESOPHAGOGASTRODUODENOSCOPY (EGD);  Surgeon: Luke Salaam, MD;  Location: [facility] ENDOSCOPY;  Service: Gastroenterology;  Laterality: N/A;   ESOPHAGOGASTRODUODENOSCOPY (EGD) WITH PROPOFOL  N/A 04/01/2018   Procedure: ESOPHAGOGASTRODUODENOSCOPY (EGD) WITH PROPOFOL ;  Surgeon: Deveron Fly, MD;  Location: [facility] ENDOSCOPY;  Service: Endoscopy;  Laterality: N/A;  ESOPHAGOGASTRODUODENOSCOPY (EGD) WITH PROPOFOL  N/A 06/01/2022   Procedure: ESOPHAGOGASTRODUODENOSCOPY (EGD) WITH PROPOFOL ;  Surgeon: Luke Salaam, MD;  Location: [facility] ENDOSCOPY;  Service: Gastroenterology;  Laterality: N/A;   EYE SURGERY     HERNIA REPAIR     JOINT REPLACEMENT Bilateral    hips  RT+  LEFT X2    LEFT HEART CATH AND CORONARY ANGIOGRAPHY N/A 12/22/2020   Procedure: LEFT HEART CATH AND CORONARY ANGIOGRAPHY;  Surgeon: Percival Brace, MD;  Location: [facility] INVASIVE CV LAB;  Service: Cardiovascular;  Laterality: N/A;   LEFT HEART CATH AND CORONARY ANGIOGRAPHY Left  08/23/2021   Procedure: LEFT HEART CATH AND CORONARY ANGIOGRAPHY;  Surgeon: Michelle Aid, MD;  Location: [facility] INVASIVE CV LAB;  Service: Cardiovascular;  Laterality: Left;   LUMBAR LAMINECTOMY/DECOMPRESSION MICRODISCECTOMY Left 09/13/2016   Procedure: Microdiscectomy - Lumbar two-three,  Lumbar three- - left;  Surgeon: Gearl Keens, MD;  Location: [facility] OR;  Service: Neurosurgery;  Laterality: Left;   SPINAL CORD STIMULATOR INSERTION  07/08/2019   TONSILLECTOMY     Patient Active Problem List   Diagnosis Date Noted   Diabetes mellitus treated with oral medication (HCC) 04/05/2023   Finger numbness 10/13/2022   Subareolar mass of left breast 10/03/2022   COPD (chronic obstructive pulmonary disease) (HCC) 10/31/2021   Shortness of breath on exertion 08/04/2021   Diabetes mellitus with proteinuria (HCC) 04/04/2021   GERD without esophagitis 04/02/2021   Coronary artery disease 12/28/2020   History of non-ST elevation myocardial infarction (NSTEMI) 12/21/2020   Pain in right shin 10/25/2020   Peripheral vascular disease (HCC) 08/06/2020   Chronic pain syndrome 07/06/2020   Cervical facet joint syndrome 04/08/2020   Spinal cord stimulator status 12/11/2019   Elevated TSH 08/20/2019   CKD (chronic kidney disease) stage 3, GFR 30-59 ml/min (HCC) 01/19/2019   Acquired thrombophilia (HCC) 01/19/2019   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   Postlaminectomy syndrome, lumbar region 01/16/2019   History of fusion of lumbar spine (L2-L5) 01/16/2019   Chronic radicular lumbar pain 01/16/2019   HNP (herniated nucleus pulposus), lumbar 04/29/2018   Advanced care planning/counseling discussion 09/28/2016   Spinal stenosis, lumbar region, 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/13/2016   Hyperlipidemia associated with type 2 diabetes mellitus (HCC) 07/14/2015   Symptomatic anemia 06/30/2015   Benign prostatic hyperplasia without lower urinary tract symptoms 06/02/2015   OSA (obstructive sleep apnea)  03/23/2015   Hypertension associated with diabetes (HCC) 09/28/2014   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HCC) 09/28/2014   H/O prior ablation treatment 10/19/2011   AF (paroxysmal atrial fibrillation) (HCC) 10/19/2011   PCP: Lemar Pyles, NP  REFERRING PROVIDER: Rosan Comfort, MD   REFERRING DIAG: Peripheral Neuropathy  RATIONALE FOR EVALUATION AND TREATMENT: Rehabilitation  THERAPY DIAG: Muscle weakness (generalized)  Difficulty in walking, not elsewhere classified  ONSET DATE: 01/25/23 (acute on chronic)  FOLLOW-UP APPT SCHEDULED WITH REFERRING PROVIDER: Yes   FROM INITIAL EVALUATION SUBJECTIVE:  SUBJECTIVE STATEMENT:  Imbalance and LE weakness;  PERTINENT HISTORY:  Lower extremity weakness in patient with 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 He reports weakness in both legs and numbness in toes post surgery in 2018. He experienced a fall on 01/25/2023 at home, resulting in a L hip injury. No residual pain in hip at this time. No additional falls since December. He reports progressive LE weakness impairing his functional ability at home. He is known to this clinic from prior episodes of care for similar concerns.  Prior history: 11/02/22 Patient reports to physical therapy today with a chief concern of muscle weakness and imbalance. Patient reports progressive worsening in balance and muscular strength. He states that he has loss of sensation in his toes he reports they  are "asleep". Patient reports pain in the anterior thigh and bilateral calves. He currently uses an loftstand crutch in his RUE. Lately patient has reported difficulty with dressing LE, performing fine motor movements with left hand. He denies falls, saddle parasthesia, nausea, vomitting, night sweats.    10/17/2022 Patient reported he is not doing well lately. He has been seeing Dr. Mont Antis. He reports his legs are so weak that he can hardly walk, so he has been using a crutch for the last 1-2 years. Reported numbness and loss of sensation in his toes. He also reported that recently his left hand has began to has some numbness to where it feels like it is asleep. Denies pain, loss of sensation, coldness, burning. Denies falls."  Imaging: NCS conducted on 11/12/2018: Abnormal study. 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 There is also preliminary evidence of a superimposed left lower lumbosacral polyradiculopathy, based on NCV and distal needle exam results. I cannot test lumbar paraspinals due to patient on Eliquis .    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 Yes. 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  Recent memory is intact.  Remote memory is intact.  Attention span and concentration are intact.  Expressive speech is intact.  Patient's fund of knowledge  is within normal limits for educational level.    Gross Musculoskeletal Assessment Tremor: None Bulk: Normal Tone: Normal  Posture: Forward head and rounded shoulders  AROM Deferred specific measurements. Functional motion intact;  LE MMT: MMT (out of 5) Right  Left   Hip flexion 4 4+  Hip extension    Hip abduction (seated) 3+ 3+  Hip adduction (seated 3+ 3+  Hip internal rotation    Hip external rotation    Knee flexion (seated) 4 4  Knee extension 4 4  Ankle dorsiflexion 4 4  Ankle plantarflexion    Ankle inversion    Ankle eversion    (* = pain; Blank rows = not tested)  Transfers: Assistive device utilized:  Single Lofstrand RUE   Sit to stand: Modified independence Stand to sit: Modified independence Chair to chair: Modified independence Floor:  Deferred  Stairs: Level of Assistance: CGA Stair Negotiation Technique: Alternating Pattern  with Bilateral Rails Number of Stairs: 4  Height of Stairs: 6"  Comments: Slow and labored ascending/descending. No overt LOB;  Gait: Gait pattern: decreased step length- Right, decreased step length- Left, trunk flexed, poor foot clearance- Right, and poor foot clearance- Left Distance walked: 150' over the course of evaluation Assistive device  utilized:  Single Lofstrand in RUE Level of assistance: Modified independence Comments: Slow and labored ambulation. Decreased self selected speed  Functional Outcome Measures  Results Comments  BERG 40/56 Increased fall risk  DGI    FGA    TUG 22.2 seconds Increased fall risk  5TSTS Unable Increased fall risk  6 Minute Walk Test    10 Meter Gait Speed Self-selected: 20.5s = 0.49 m/s; Fastest: 18.0s = 0.56 m/s Below community ambulation speed  (Blank rows = not tested)   TODAY'S TREATMENT    SUBJECTIVE: Pt reports that he is doing alright today. No significant changes since the last therapy session. Denies any specific pain upon arrival. No questions or concerns currently.    PAIN: Denies   Ther-ex  NuStep (seat 12, hands 13) L1-3 x 10 minutes for BLE strengthening and warm-up during interval history (5 minutes unbilled); Sit to stand from regular height chair with Airex pad on seat 2 x 10;  Standing hip strengthening with 4# ankle weights: Hip flexion forward walking marches 2 x 10 BLE; HS curls 2 x 10 BLE; Hip abduction 2 x 10 BLE; Hip extension 2 x 10 BLE;  Side stepping with 4# ankle weights x multiple lengths; Seated LAQ with 4# ankle weights 2 x 10 BLE; Seated clams with green tband 2 x 10 BLE; Seated adductor ball squeezes 2 x 10 BLE;   Not performed: 6" alternating step ups without UE support x 10 leading with each LE;    PATIENT EDUCATION:  Education details: Pt educated throughout session about proper posture and technique with exercises. Improved exercise technique, movement at target joints, use of target muscles after min to mod verbal, visual, tactile cues.  Person educated: Patient Education method: Explanation, Verbal cues, and Handouts Education comprehension: verbalized understanding and returned demonstration   HOME EXERCISE PROGRAM:  Access Code: [email or web address] URL: [email or web address] Date: 05/31/2023 Prepared by: Crawford Dock  Exercises - Seated Long Arc Quad  - 1 x daily - 3-4 x weekly - 3 sets - 10 reps - 2s hold - Seated Hip Abduction with Resistance  - 1 x daily - 3-4 x weekly - 3 sets - 10 reps - 2s hold - Heel Raises with Counter Support  -  1 x daily - 3-4 x weekly - 3 sets - 10 reps - 2s hold - Mini Squat with Counter Support  - 1 x daily - 3-4 x weekly - 3 sets - 10 reps - Semi-Tandem Balance at [employer] Eyes Open  - 1 x daily - 7 x weekly - 2-3 sets - 10 reps - 10-30 hold   ASSESSMENT:  CLINICAL IMPRESSION: Progressed strengthening exercises during session today with patient. Progressed ankle weights today to increase challenge. No HEP modifications at this time. Pt encouraged to follow-up as scheduled. Will introduce balance exercises at future session. He will benefit from PT services to address deficits in strength, balance, and mobility in order to return to full function at home and decrease his risk for falls.    OBJECTIVE IMPAIRMENTS: Abnormal gait, decreased balance, difficulty walking, decreased strength, impaired sensation, and pain.   ACTIVITY LIMITATIONS: bending, standing, squatting, stairs, transfers, and caring for others  PARTICIPATION LIMITATIONS: meal prep, cleaning, laundry, shopping, and community activity  PERSONAL FACTORS: Age, Past/current experiences, Time since onset of injury/illness/exacerbation, and 3+ comorbidities: generalized polyneuropathy, COPD, DM, NSTEMI, and spinal stenosis  are also affecting patient's functional outcome.   REHAB POTENTIAL: Fair    CLINICAL DECISION MAKING: Unstable/unpredictable  EVALUATION COMPLEXITY: High   GOALS: Goals reviewed with patient? No  SHORT TERM GOALS: Target date: 07/10/2023  Pt will be independent with HEP in order to improve strength and balance in order to decrease fall risk and improve function at home. Baseline:  Goal status: INITIAL   LONG TERM GOALS: Target date: 08/21/2023  Pt will improve ABC by at  least 13% in order to demonstrate clinically significant improvement in balance confidence.  Baseline: 20% Goal status: INITIAL  2.  Pt will improve BERG by at least 3 points in order to demonstrate clinically significant improvement in balance.   Baseline: 40/56; Goal status: INITIAL  3.  Pt will increase self-selected by at least 0.13 m/s in order to demonstrate clinically significant improvement in community ambulation.         Baseline: self-selected: 0.49 m/s Goal status: INITIAL  4. Pt will be able to complete 5TSTS without UE support in order to demonstrate clinically significant improvement in LE strength      Baseline: Unable to perform sit to stand without heavy UE assist Goal status: INITIAL  5. Pt will decrease TUG to below 14 seconds/decrease in order to demonstrate decreased fall risk.  Baseline: 22.2s Goal status: INITIAL  PLAN: PT FREQUENCY: 2x/week  PT DURATION: 12 weeks  PLANNED INTERVENTIONS: Therapeutic exercises, Therapeutic activity, Neuromuscular re-education, Balance training, Gait training, Patient/Family education, Self Care, Joint mobilization, Joint manipulation, Vestibular training, Canalith repositioning, Orthotic/Fit training, DME instructions, Dry Needling, Electrical stimulation, Spinal manipulation, Spinal mobilization, Cryotherapy, Moist heat, Taping, Traction, Ultrasound, Ionotophoresis 4mg /ml Dexamethasone , Manual therapy, and Re-evaluation.  PLAN FOR NEXT SESSION: Progress strengthening and introduce balance exercises, modify/progress HEP as needed;    Annalycia Done D Amarrion Pastorino PT, DPT, GCS  Toryn Dewalt 06/12/2023, 7:04 PM

## 2023-06-14 ENCOUNTER — Ambulatory Visit: Attending: Neurology

## 2023-06-14 DIAGNOSIS — R262 Difficulty in walking, not elsewhere classified: Secondary | ICD-10-CM | POA: Diagnosis not present

## 2023-06-14 DIAGNOSIS — M5459 Other low back pain: Secondary | ICD-10-CM | POA: Diagnosis not present

## 2023-06-14 DIAGNOSIS — M6281 Muscle weakness (generalized): Secondary | ICD-10-CM | POA: Insufficient documentation

## 2023-06-14 DIAGNOSIS — R2681 Unsteadiness on feet: Secondary | ICD-10-CM | POA: Diagnosis not present

## 2023-06-14 NOTE — Therapy (Signed)
 OUTPATIENT PHYSICAL THERAPY BALANCE TREATMENT   Patient Name: Sean Reyes. MRN: [id number] DOB:10/14/1940, 83 y.o., male Today's Date: 06/14/2023  END OF SESSION:  PT End of Session - 06/14/23 1321     Visit Number 5    Number of Visits 25    Date for PT Re-Evaluation 08/21/23    Authorization Type eval: 05/29/23;    Authorization Time Period Medicare A&B 2025  VL: Based on [place]  No auth req    Progress Note Due on Visit 20    PT Start Time 1056    PT Stop Time 1135    PT Time Calculation (min) 39 min    Equipment Utilized During Treatment Gait belt    Activity Tolerance Patient tolerated treatment well;Patient limited by pain    Behavior During Therapy WFL for tasks assessed/performed              Past Medical History:  Diagnosis Date   Anemia    Anxiety    Arthritis    Arthritis of neck    Atrial fibrillation (HCC)    Cataracts, bilateral    Complication of anesthesia    pt reports low BP's after surgery at [facility] and difficulty awakening   Depression    Diabetes (HCC)    dx 6-8 yrs ago   Dysrhythmia    a-fib   GERD (gastroesophageal reflux disease)    OCC TAKES ALKA SELTZER   History of kidney stones    10-15 yrs ago   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   Hyperlipidemia    Hypertension    Myocardial infarction (HCC) 12/2020   Nocturia    S/P ablation of atrial fibrillation    Ablative therapy   Sleep apnea    CPAP    Spinal stenosis    Tachycardia, unspecified    Past Surgical History:  Procedure Laterality Date   ABLATION     ANTERIOR LAT LUMBAR FUSION N/A 06/27/2017   Procedure: Anterior Lateral Lumbar Interbody  Fusion - Lumbar Two-Lumbar Three - Lumbar Three-Lumbar Four, Posterior Lumbar Interbody Fusion Lumbar Four- Five;  Surgeon: Gearl Keens, MD;  Location: [facility] OR;  Service: Neurosurgery;  Laterality: N/A;  Anterior Lateral Lumbar Interbody  Fusion - Lumbar Two-Lumbar Three - Lumbar Three-Lumbar Four, Posterior Lumbar  Interbody Fusion Lumbar Four- Five   BACK SURGERY     CARDIAC CATHETERIZATION     CARDIOVERSION N/A 08/29/2018   Procedure: CARDIOVERSION;  Surgeon: Michelle Aid, MD;  Location: [facility] ORS;  Service: Cardiovascular;  Laterality: N/A;   CARDIOVERSION N/A 09/24/2018   Procedure: CARDIOVERSION;  Surgeon: Michelle Aid, MD;  Location: [facility] ORS;  Service: Cardiovascular;  Laterality: N/A;   COLONOSCOPY WITH PROPOFOL  N/A 10/05/2015   Procedure: COLONOSCOPY WITH PROPOFOL ;  Surgeon: Deveron Fly, MD;  Location: [facility] ENDOSCOPY;  Service: Endoscopy;  Laterality: N/A;   COLONOSCOPY WITH PROPOFOL  N/A 11/01/2020   Procedure: COLONOSCOPY WITH PROPOFOL ;  Surgeon: Luke Salaam, MD;  Location: [facility] ENDOSCOPY;  Service: Gastroenterology;  Laterality: N/A;   CORONARY STENT INTERVENTION N/A 12/22/2020   Procedure: CORONARY STENT INTERVENTION;  Surgeon: Percival Brace, MD;  Location: [facility] INVASIVE CV LAB;  Service: Cardiovascular;  Laterality: N/A;   ESOPHAGOGASTRODUODENOSCOPY N/A 11/01/2020   Procedure: ESOPHAGOGASTRODUODENOSCOPY (EGD);  Surgeon: Luke Salaam, MD;  Location: [facility] ENDOSCOPY;  Service: Gastroenterology;  Laterality: N/A;   ESOPHAGOGASTRODUODENOSCOPY (EGD) WITH PROPOFOL  N/A 04/01/2018   Procedure: ESOPHAGOGASTRODUODENOSCOPY (EGD) WITH PROPOFOL ;  Surgeon: Deveron Fly, MD;  Location:  [facility] ENDOSCOPY;  Service: Endoscopy;  Laterality: N/A;   ESOPHAGOGASTRODUODENOSCOPY (EGD) WITH PROPOFOL  N/A 06/01/2022   Procedure: ESOPHAGOGASTRODUODENOSCOPY (EGD) WITH PROPOFOL ;  Surgeon: Luke Salaam, MD;  Location: [facility] ENDOSCOPY;  Service: Gastroenterology;  Laterality: N/A;   EYE SURGERY     HERNIA REPAIR     JOINT REPLACEMENT Bilateral    hips  RT+  LEFT X2    LEFT HEART CATH AND CORONARY ANGIOGRAPHY N/A 12/22/2020   Procedure: LEFT HEART CATH AND CORONARY ANGIOGRAPHY;  Surgeon: Percival Brace, MD;  Location: [facility] INVASIVE CV LAB;  Service: Cardiovascular;  Laterality: N/A;   LEFT HEART  CATH AND CORONARY ANGIOGRAPHY Left 08/23/2021   Procedure: LEFT HEART CATH AND CORONARY ANGIOGRAPHY;  Surgeon: Michelle Aid, MD;  Location: [facility] INVASIVE CV LAB;  Service: Cardiovascular;  Laterality: Left;   LUMBAR LAMINECTOMY/DECOMPRESSION MICRODISCECTOMY Left 09/13/2016   Procedure: Microdiscectomy - Lumbar two-three,  Lumbar three- - left;  Surgeon: Gearl Keens, MD;  Location: [facility] OR;  Service: Neurosurgery;  Laterality: Left;   SPINAL CORD STIMULATOR INSERTION  07/08/2019   TONSILLECTOMY     Patient Active Problem List   Diagnosis Date Noted   Diabetes mellitus treated with oral medication (HCC) 04/05/2023   Finger numbness 10/13/2022   Subareolar mass of left breast 10/03/2022   COPD (chronic obstructive pulmonary disease) (HCC) 10/31/2021   Shortness of breath on exertion 08/04/2021   Diabetes mellitus with proteinuria (HCC) 04/04/2021   GERD without esophagitis 04/02/2021   Coronary artery disease 12/28/2020   History of non-ST elevation myocardial infarction (NSTEMI) 12/21/2020   Pain in right shin 10/25/2020   Peripheral vascular disease (HCC) 08/06/2020   Chronic pain syndrome 07/06/2020   Cervical facet joint syndrome 04/08/2020   Spinal cord stimulator status 12/11/2019   Elevated TSH 08/20/2019   CKD (chronic kidney disease) stage 3, GFR 30-59 ml/min (HCC) 01/19/2019   Acquired thrombophilia (HCC) 01/19/2019   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   Postlaminectomy syndrome, lumbar region 01/16/2019   History of fusion of lumbar spine (L2-L5) 01/16/2019   Chronic radicular lumbar pain 01/16/2019   HNP (herniated nucleus pulposus), lumbar 04/29/2018   Advanced care planning/counseling discussion 09/28/2016   Spinal stenosis, lumbar region, 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/13/2016   Hyperlipidemia associated with type 2 diabetes mellitus (HCC) 07/14/2015   Symptomatic anemia 06/30/2015   Benign prostatic hyperplasia without lower urinary tract symptoms 06/02/2015    OSA (obstructive sleep apnea) 03/23/2015   Hypertension associated with diabetes (HCC) 09/28/2014   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HCC) 09/28/2014   H/O prior ablation treatment 10/19/2011   AF (paroxysmal atrial fibrillation) (HCC) 10/19/2011   PCP: Lemar Pyles, NP  REFERRING PROVIDER: Rosan Comfort, MD   REFERRING DIAG: Peripheral Neuropathy  RATIONALE FOR EVALUATION AND TREATMENT: Rehabilitation  THERAPY DIAG: Difficulty in walking, not elsewhere classified  Muscle weakness (generalized)  Other low back pain  Unsteadiness on feet  ONSET DATE: 01/25/23 (acute on chronic)  FOLLOW-UP APPT SCHEDULED WITH REFERRING PROVIDER: Yes   FROM INITIAL EVALUATION SUBJECTIVE:  SUBJECTIVE STATEMENT:  Imbalance and LE weakness;  PERTINENT HISTORY:  Lower extremity weakness in patient with 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 He reports weakness in both legs and numbness in toes post surgery in 2018. He experienced a fall on 01/25/2023 at home, resulting in a L hip injury. No residual pain in hip at this time. No additional falls since December. He reports progressive LE weakness impairing his functional ability at home. He is known to this clinic from prior episodes of care for similar concerns.  Prior history: 11/02/22 Patient reports to physical therapy today with a chief concern of muscle weakness and imbalance. Patient reports progressive worsening in balance and muscular  strength. He states that he has loss of sensation in his toes he reports they are "asleep". Patient reports pain in the anterior thigh and bilateral calves. He currently uses an loftstand crutch in his RUE. Lately patient has reported difficulty with dressing LE, performing fine motor movements with left hand. He denies falls, saddle parasthesia, nausea, vomitting, night sweats.    10/17/2022 Patient reported he is not doing well lately. He has been seeing Dr. Mont Antis. He reports his legs are so weak that he can hardly walk, so he has been using a crutch for the last 1-2 years. Reported numbness and loss of sensation in his toes. He also reported that recently his left hand has began to has some numbness to where it feels like it is asleep. Denies pain, loss of sensation, coldness, burning. Denies falls."  Imaging: NCS conducted on 11/12/2018: Abnormal study. 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 There is also preliminary evidence of a superimposed left lower lumbosacral polyradiculopathy, based on NCV and distal needle exam results. I cannot test lumbar paraspinals due to patient on Eliquis .    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 Yes. 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  Recent memory is intact.  Remote memory is intact.  Attention span and concentration are intact.  Expressive speech is intact.  Patient's fund of knowledge  is within normal limits for educational level.    Gross Musculoskeletal Assessment Tremor: None Bulk: Normal Tone: Normal  Posture: Forward head and rounded shoulders  AROM Deferred specific measurements. Functional motion intact;  LE MMT: MMT (out of 5) Right  Left   Hip flexion 4 4+  Hip extension    Hip abduction (seated) 3+ 3+  Hip adduction (seated 3+ 3+  Hip internal rotation    Hip external rotation    Knee flexion (seated) 4 4  Knee extension 4 4  Ankle dorsiflexion 4 4  Ankle plantarflexion    Ankle inversion    Ankle eversion    (* = pain; Blank rows = not tested)  Transfers: Assistive device utilized:  Single Lofstrand RUE   Sit to stand: Modified independence Stand to sit: Modified independence Chair to chair: Modified independence Floor:  Deferred  Stairs: Level of Assistance: CGA Stair Negotiation Technique: Alternating Pattern  with Bilateral Rails Number of Stairs: 4  Height of Stairs: 6"  Comments: Slow and labored ascending/descending. No overt LOB;  Gait: Gait pattern: decreased step length- Right, decreased step length- Left, trunk flexed, poor foot clearance- Right, and poor foot clearance-  Left Distance walked: 150' over the course of evaluation Assistive device utilized:  Single Lofstrand in RUE Level of assistance: Modified independence Comments: Slow and labored ambulation. Decreased self selected speed  Functional Outcome Measures  Results Comments  BERG 40/56 Increased fall risk  DGI    FGA    TUG 22.2 seconds Increased fall risk  5TSTS Unable Increased fall risk  6 Minute Walk Test    10 Meter Gait Speed Self-selected: 20.5s = 0.49 m/s; Fastest: 18.0s = 0.56 m/s Below community ambulation speed  (Blank rows = not tested)   TODAY'S TREATMENT    SUBJECTIVE: Pt reports that he is hurting a lot today 6/10  in LLE>RLE. I am having difficulties today. I don't think my pain has even gone away."   PAIN: 6/10 in B hips L>R   Ther-ex  NuStep (seat 12, hands 13) L0 -2 x 10 minutes for BLE strengthening and warm-up during interval history (5 minutes unbilled); Sit to stand from regular height chair with Airex pad on seat 2 x 10;  Standing hip strengthening with 2# ankle weights: Hip flexion forward walking marches 2 x 10 BLE; HS curls #2 2 x 10 BLE; Hip abduction #2 2 x 10 BLE; Hip extension #2 2 x 10 BLE; Alt Step tapping with support 2 x 10 reps. Partial squats with ball squeeze 2 x 10 reps.   Side stepping with 2# ankle weights x multiple lengths; Seated LAQ with 2# ankle weights 2 x 10 BLE; Seated clams with Red tband 2 x 10 BLE; Seated adductor ball squeezes 2 x 10 BLE;  Not performed: 6" alternating step ups without UE support x 10 leading with each LE;    PATIENT EDUCATION:  Education details: Pt educated throughout session about proper posture and technique with exercises. Improved exercise technique, movement at target joints, use of target muscles after min to mod verbal, visual, tactile cues.  Person educated: Patient Education method: Explanation, Verbal cues, and Handouts Education comprehension: verbalized understanding and returned  demonstration   HOME EXERCISE PROGRAM:  Access Code: [email or web address] URL: [email or web address] Date: 05/31/2023 Prepared by: Crawford Dock  Exercises - Seated Long Arc Quad  - 1 x daily - 3-4 x weekly - 3 sets - 10 reps - 2s hold - Seated Hip Abduction with Resistance  -  1 x daily - 3-4 x weekly - 3 sets - 10 reps - 2s hold - Heel Raises with Counter Support  - 1 x daily - 3-4 x weekly - 3 sets - 10 reps - 2s hold - Mini Squat with Counter Support  - 1 x daily - 3-4 x weekly - 3 sets - 10 reps - Semi-Tandem Balance at [employer] Eyes Open  - 1 x daily - 7 x weekly - 2-3 sets - 10 reps - 10-30 hold   ASSESSMENT:  CLINICAL IMPRESSION: Pt returns with increased pain in B hip L>R affecting gait and transfers. Pt requested to go easy today. PT continued with POC with decreased resistance which helped pt with participation and decreased pain level to 3/10 in LLE and 0/10 in RLE by the end of the session. Progressed strengthening exercises during session today with patient. Progressed ankle weights today to increase challenge. No HEP modifications at this time. DOS in BLE is highly likely. Pt advised to use cold pack in B thigh and L hip for pain management. PT remains weak in BLE and postural muscles persists. Pt encouraged to follow-up as scheduled. Will introduce balance exercises at future session. He will benefit from PT services to address deficits in strength, balance, and mobility in order to return to full function at home and decrease his risk for falls.    OBJECTIVE IMPAIRMENTS: Abnormal gait, decreased balance, difficulty walking, decreased strength, impaired sensation, and pain.   ACTIVITY LIMITATIONS: bending, standing, squatting, stairs, transfers, and caring for others  PARTICIPATION LIMITATIONS: meal prep, cleaning, laundry, shopping, and community activity  PERSONAL FACTORS: Age, Past/current experiences, Time since onset of injury/illness/exacerbation, and 3+  comorbidities: generalized polyneuropathy, COPD, DM, NSTEMI, and spinal stenosis  are also affecting patient's functional outcome.   REHAB POTENTIAL: Fair    CLINICAL DECISION MAKING: Unstable/unpredictable  EVALUATION COMPLEXITY: High   GOALS: Goals reviewed with patient? No  SHORT TERM GOALS: Target date: 07/10/2023  Pt will be independent with HEP in order to improve strength and balance in order to decrease fall risk and improve function at home. Baseline:  Goal status: INITIAL   LONG TERM GOALS: Target date: 08/21/2023  Pt will improve ABC by at least 13% in order to demonstrate clinically significant improvement in balance confidence.  Baseline: 20% Goal status: INITIAL  2.  Pt will improve BERG by at least 3 points in order to demonstrate clinically significant improvement in balance.   Baseline: 40/56; Goal status: INITIAL  3.  Pt will increase self-selected by at least 0.13 m/s in order to demonstrate clinically significant improvement in community ambulation.         Baseline: self-selected: 0.49 m/s Goal status: INITIAL  4. Pt will be able to complete 5TSTS without UE support in order to demonstrate clinically significant improvement in LE strength      Baseline: Unable to perform sit to stand without heavy UE assist Goal status: INITIAL  5. Pt will decrease TUG to below 14 seconds/decrease in order to demonstrate decreased fall risk.  Baseline: 22.2s Goal status: INITIAL  PLAN: PT FREQUENCY: 2x/week  PT DURATION: 12 weeks  PLANNED INTERVENTIONS: Therapeutic exercises, Therapeutic activity, Neuromuscular re-education, Balance training, Gait training, Patient/Family education, Self Care, Joint mobilization, Joint manipulation, Vestibular training, Canalith repositioning, Orthotic/Fit training, DME instructions, Dry Needling, Electrical stimulation, Spinal manipulation, Spinal mobilization, Cryotherapy, Moist heat, Taping, Traction, Ultrasound, Ionotophoresis  4mg /ml Dexamethasone , Manual therapy, and Re-evaluation.  PLAN FOR NEXT SESSION: Progress strengthening and introduce  balance exercises, modify/progress HEP as needed;    Dhalia Zingaro [place] PT DPT 1:22 PM,06/14/23

## 2023-06-19 ENCOUNTER — Ambulatory Visit

## 2023-06-19 DIAGNOSIS — M6281 Muscle weakness (generalized): Secondary | ICD-10-CM

## 2023-06-19 DIAGNOSIS — R262 Difficulty in walking, not elsewhere classified: Secondary | ICD-10-CM

## 2023-06-21 ENCOUNTER — Ambulatory Visit

## 2023-06-21 DIAGNOSIS — M6281 Muscle weakness (generalized): Secondary | ICD-10-CM | POA: Diagnosis not present

## 2023-06-21 DIAGNOSIS — R262 Difficulty in walking, not elsewhere classified: Secondary | ICD-10-CM | POA: Diagnosis not present

## 2023-06-21 DIAGNOSIS — M5459 Other low back pain: Secondary | ICD-10-CM | POA: Diagnosis not present

## 2023-06-21 DIAGNOSIS — R2681 Unsteadiness on feet: Secondary | ICD-10-CM | POA: Diagnosis not present

## 2023-06-21 NOTE — Therapy (Signed)
 OUTPATIENT PHYSICAL THERAPY BALANCE TREATMENT   Patient Name: Sean Reyes. MRN: [id number] DOB:08-11-1940, 83 y.o., male Today's Date: 06/23/2023  END OF SESSION:  PT End of Session - 06/23/23 1016     Visit Number 6    Number of Visits 25    Date for PT Re-Evaluation 08/21/23    Authorization Type eval: 05/29/23;    Authorization Time Period Medicare A&B 2025  VL: Based on [place]  No auth req    PT Start Time 1102    PT Stop Time 1145    PT Time Calculation (min) 43 min    Equipment Utilized During Treatment Gait belt    Activity Tolerance Patient tolerated treatment well;Patient limited by pain    Behavior During Therapy WFL for tasks assessed/performed            Past Medical History:  Diagnosis Date   Anemia    Anxiety    Arthritis    Arthritis of neck    Atrial fibrillation (HCC)    Cataracts, bilateral    Complication of anesthesia    pt reports low BP's after surgery at [facility] and difficulty awakening   Depression    Diabetes (HCC)    dx 6-8 yrs ago   Dysrhythmia    a-fib   GERD (gastroesophageal reflux disease)    OCC TAKES ALKA SELTZER   History of kidney stones    10-15 yrs ago   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   Hyperlipidemia    Hypertension    Myocardial infarction (HCC) 12/2020   Nocturia    S/P ablation of atrial fibrillation    Ablative therapy   Sleep apnea    CPAP    Spinal stenosis    Tachycardia, unspecified    Past Surgical History:  Procedure Laterality Date   ABLATION     ANTERIOR LAT LUMBAR FUSION N/A 06/27/2017   Procedure: Anterior Lateral Lumbar Interbody  Fusion - Lumbar Two-Lumbar Three - Lumbar Three-Lumbar Four, Posterior Lumbar Interbody Fusion Lumbar Four- Five;  Surgeon: Gearl Keens, MD;  Location: [facility] OR;  Service: Neurosurgery;  Laterality: N/A;  Anterior Lateral Lumbar Interbody  Fusion - Lumbar Two-Lumbar Three - Lumbar Three-Lumbar Four, Posterior Lumbar Interbody Fusion Lumbar Four- Five    BACK SURGERY     CARDIAC CATHETERIZATION     CARDIOVERSION N/A 08/29/2018   Procedure: CARDIOVERSION;  Surgeon: Michelle Aid, MD;  Location: [facility] ORS;  Service: Cardiovascular;  Laterality: N/A;   CARDIOVERSION N/A 09/24/2018   Procedure: CARDIOVERSION;  Surgeon: Michelle Aid, MD;  Location: [facility] ORS;  Service: Cardiovascular;  Laterality: N/A;   COLONOSCOPY WITH PROPOFOL  N/A 10/05/2015   Procedure: COLONOSCOPY WITH PROPOFOL ;  Surgeon: Deveron Fly, MD;  Location: [facility] ENDOSCOPY;  Service: Endoscopy;  Laterality: N/A;   COLONOSCOPY WITH PROPOFOL  N/A 11/01/2020   Procedure: COLONOSCOPY WITH PROPOFOL ;  Surgeon: Luke Salaam, MD;  Location: [facility] ENDOSCOPY;  Service: Gastroenterology;  Laterality: N/A;   CORONARY STENT INTERVENTION N/A 12/22/2020   Procedure: CORONARY STENT INTERVENTION;  Surgeon: Percival Brace, MD;  Location: [facility] INVASIVE CV LAB;  Service: Cardiovascular;  Laterality: N/A;   ESOPHAGOGASTRODUODENOSCOPY N/A 11/01/2020   Procedure: ESOPHAGOGASTRODUODENOSCOPY (EGD);  Surgeon: Luke Salaam, MD;  Location: [facility] ENDOSCOPY;  Service: Gastroenterology;  Laterality: N/A;   ESOPHAGOGASTRODUODENOSCOPY (EGD) WITH PROPOFOL  N/A 04/01/2018   Procedure: ESOPHAGOGASTRODUODENOSCOPY (EGD) WITH PROPOFOL ;  Surgeon: Deveron Fly, MD;  Location: [facility] ENDOSCOPY;  Service: Endoscopy;  Laterality: N/A;   ESOPHAGOGASTRODUODENOSCOPY (  EGD) WITH PROPOFOL  N/A 06/01/2022   Procedure: ESOPHAGOGASTRODUODENOSCOPY (EGD) WITH PROPOFOL ;  Surgeon: Luke Salaam, MD;  Location: [facility] ENDOSCOPY;  Service: Gastroenterology;  Laterality: N/A;   EYE SURGERY     HERNIA REPAIR     JOINT REPLACEMENT Bilateral    hips  RT+  LEFT X2    LEFT HEART CATH AND CORONARY ANGIOGRAPHY N/A 12/22/2020   Procedure: LEFT HEART CATH AND CORONARY ANGIOGRAPHY;  Surgeon: Percival Brace, MD;  Location: [facility] INVASIVE CV LAB;  Service: Cardiovascular;  Laterality: N/A;   LEFT HEART CATH AND CORONARY ANGIOGRAPHY Left  08/23/2021   Procedure: LEFT HEART CATH AND CORONARY ANGIOGRAPHY;  Surgeon: Michelle Aid, MD;  Location: [facility] INVASIVE CV LAB;  Service: Cardiovascular;  Laterality: Left;   LUMBAR LAMINECTOMY/DECOMPRESSION MICRODISCECTOMY Left 09/13/2016   Procedure: Microdiscectomy - Lumbar two-three,  Lumbar three- - left;  Surgeon: Gearl Keens, MD;  Location: [facility] OR;  Service: Neurosurgery;  Laterality: Left;   SPINAL CORD STIMULATOR INSERTION  07/08/2019   TONSILLECTOMY     Patient Active Problem List   Diagnosis Date Noted   Diabetes mellitus treated with oral medication (HCC) 04/05/2023   Finger numbness 10/13/2022   Subareolar mass of left breast 10/03/2022   COPD (chronic obstructive pulmonary disease) (HCC) 10/31/2021   Shortness of breath on exertion 08/04/2021   Diabetes mellitus with proteinuria (HCC) 04/04/2021   GERD without esophagitis 04/02/2021   Coronary artery disease 12/28/2020   History of non-ST elevation myocardial infarction (NSTEMI) 12/21/2020   Pain in right shin 10/25/2020   Peripheral vascular disease (HCC) 08/06/2020   Chronic pain syndrome 07/06/2020   Cervical facet joint syndrome 04/08/2020   Spinal cord stimulator status 12/11/2019   Elevated TSH 08/20/2019   CKD (chronic kidney disease) stage 3, GFR 30-59 ml/min (HCC) 01/19/2019   Acquired thrombophilia (HCC) 01/19/2019   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   Postlaminectomy syndrome, lumbar region 01/16/2019   History of fusion of lumbar spine (L2-L5) 01/16/2019   Chronic radicular lumbar pain 01/16/2019   HNP (herniated nucleus pulposus), lumbar 04/29/2018   Advanced care planning/counseling discussion 09/28/2016   Spinal stenosis, lumbar region, 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/13/2016   Hyperlipidemia associated with type 2 diabetes mellitus (HCC) 07/14/2015   Symptomatic anemia 06/30/2015   Benign prostatic hyperplasia without lower urinary tract symptoms 06/02/2015   OSA (obstructive sleep apnea)  03/23/2015   Hypertension associated with diabetes (HCC) 09/28/2014   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HCC) 09/28/2014   H/O prior ablation treatment 10/19/2011   AF (paroxysmal atrial fibrillation) (HCC) 10/19/2011   PCP: Lemar Pyles, NP  REFERRING PROVIDER: Rosan Comfort, MD   REFERRING DIAG: Peripheral Neuropathy  RATIONALE FOR EVALUATION AND TREATMENT: Rehabilitation  THERAPY DIAG: Difficulty in walking, not elsewhere classified  Muscle weakness (generalized)  ONSET DATE: 01/25/23 (acute on chronic)  FOLLOW-UP APPT SCHEDULED WITH REFERRING PROVIDER: Yes   FROM INITIAL EVALUATION SUBJECTIVE:  SUBJECTIVE STATEMENT:  Imbalance and LE weakness;  PERTINENT HISTORY:  Lower extremity weakness in patient with 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 He reports weakness in both legs and numbness in toes post surgery in 2018. He experienced a fall on 01/25/2023 at home, resulting in a L hip injury. No residual pain in hip at this time. No additional falls since December. He reports progressive LE weakness impairing his functional ability at home. He is known to this clinic from prior episodes of care for similar concerns.  Prior history: 11/02/22 Patient reports to physical therapy today with a chief concern of muscle weakness and imbalance. Patient reports progressive worsening in balance and muscular strength. He states that he has loss of sensation in his toes he reports they  are "asleep". Patient reports pain in the anterior thigh and bilateral calves. He currently uses an loftstand crutch in his RUE. Lately patient has reported difficulty with dressing LE, performing fine motor movements with left hand. He denies falls, saddle parasthesia, nausea, vomitting, night sweats.    10/17/2022 Patient reported he is not doing well lately. He has been seeing Dr. Mont Antis. He reports his legs are so weak that he can hardly walk, so he has been using a crutch for the last 1-2 years. Reported numbness and loss of sensation in his toes. He also reported that recently his left hand has began to has some numbness to where it feels like it is asleep. Denies pain, loss of sensation, coldness, burning. Denies falls."  Imaging: NCS conducted on 11/12/2018: Abnormal study. 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 There is also preliminary evidence of a superimposed left lower lumbosacral polyradiculopathy, based on NCV and distal needle exam results. I cannot test lumbar paraspinals due to patient on Eliquis .    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 Yes. 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  Recent memory is intact.  Remote memory is intact.  Attention span and concentration are intact.  Expressive speech is intact.  Patient's fund of knowledge is  within normal limits for educational level.    Gross Musculoskeletal Assessment Tremor: None Bulk: Normal Tone: Normal  Posture: Forward head and rounded shoulders  AROM Deferred specific measurements. Functional motion intact;  LE MMT: MMT (out of 5) Right  Left   Hip flexion 4 4+  Hip extension    Hip abduction (seated) 3+ 3+  Hip adduction (seated 3+ 3+  Hip internal rotation    Hip external rotation    Knee flexion (seated) 4 4  Knee extension 4 4  Ankle dorsiflexion 4 4  Ankle plantarflexion    Ankle inversion    Ankle eversion    (* = pain; Blank rows = not tested)  Transfers: Assistive device utilized: Single Lofstrand RUE  Sit to stand: Modified independence Stand to sit: Modified independence Chair to chair: Modified independence Floor: Deferred  Stairs: Level of Assistance: CGA Stair Negotiation Technique: Alternating Pattern  with Bilateral Rails Number of Stairs: 4  Height of Stairs: 6"  Comments: Slow and labored ascending/descending. No overt LOB;  Gait: Gait pattern: decreased step length- Right, decreased step length- Left, trunk flexed, poor foot clearance- Right, and poor foot clearance- Left Distance walked: 150' over the course of evaluation Assistive device  utilized: Single Lofstrand in RUE Level of assistance: Modified independence Comments: Slow and labored ambulation. Decreased self selected speed  Functional Outcome Measures  Results Comments  BERG 40/56 Increased fall risk  DGI    FGA    TUG 22.2 seconds Increased fall risk  5TSTS Unable Increased fall risk  6 Minute Walk Test    10 Meter Gait Speed Self-selected: 20.5s = 0.49 m/s; Fastest: 18.0s = 0.56 m/s Below community ambulation speed  (Blank rows = not tested)   TODAY'S TREATMENT    SUBJECTIVE: Pt reports that he is having a lot of back pain today. He rates his pain as 6-7/10.    PAIN: 6-7/10 low back;   Ther-ex  NuStep (seat 12, hands 13) L0-4 x 10 minutes for BLE strengthening and warm-up during interval history (5 minutes unbilled); Sit to stand from regular height chair with Airex pad on seat 2 x 10;  Standing hip strengthening with 5# ankle weights: Hip flexion marches 2 x 10 BLE; HS curls 2 x 10 BLE; Hip abduction 2 x 10 BLE; Hip extension 2 x 10 BLE;  Side stepping with 5# ankle weights x multiple lengths; Seated LAQ with 5# ankle weights 2 x 10 BLE; Seated clams with manual resistance 2 x 10 BLE; Seated adductor squeezes with manual resistance 2 x 10 BLE;   PATIENT EDUCATION:  Education details: Pt educated throughout session about proper posture and technique with exercises. Improved exercise technique, movement at target joints, use of target muscles after min to mod verbal, visual, tactile cues.  Person educated: Patient Education method: Explanation, Verbal cues, and Handouts Education comprehension: verbalized understanding and returned demonstration   HOME EXERCISE PROGRAM:  Access Code: [email or web address] URL: [email or web address] Date: 05/31/2023 Prepared by: Crawford Dock  Exercises - Seated Long Arc Quad  - 1 x daily - 3-4 x weekly - 3 sets - 10 reps - 2s hold - Seated Hip Abduction with Resistance  - 1 x daily - 3-4 x weekly - 3  sets - 10 reps - 2s hold - Heel Raises with Counter Support  - 1 x daily - 3-4 x weekly - 3 sets - 10 reps - 2s hold - Mini Squat with Counter Support  - 1 x daily - 3-4 x  weekly - 3 sets - 10 reps - Semi-Tandem Balance at [employer] Eyes Open  - 1 x daily - 7 x weekly - 2-3 sets - 10 reps - 10-30 hold   ASSESSMENT:  CLINICAL IMPRESSION: Progressed strengthening exercises during session today with patient. Progressed ankle weights today to increase challenge. No HEP modifications at this time. Pt encouraged to follow-up as scheduled. Will introduce balance exercises at future session. He will benefit from PT services to address deficits in strength, balance, and mobility in order to return to full function at home and decrease his risk for falls.      OBJECTIVE IMPAIRMENTS: Abnormal gait, decreased balance, difficulty walking, decreased strength, impaired sensation, and pain.   ACTIVITY LIMITATIONS: bending, standing, squatting, stairs, transfers, and caring for others  PARTICIPATION LIMITATIONS: meal prep, cleaning, laundry, shopping, and community activity  PERSONAL FACTORS: Age, Past/current experiences, Time since onset of injury/illness/exacerbation, and 3+ comorbidities: generalized polyneuropathy, COPD, DM, NSTEMI, and spinal stenosis are also affecting patient's functional outcome.   REHAB POTENTIAL: Fair    CLINICAL DECISION MAKING: Unstable/unpredictable  EVALUATION COMPLEXITY: High   GOALS: Goals reviewed with patient? No  SHORT TERM GOALS: Target date: 07/10/2023  Pt will be independent with HEP in order to improve strength and balance in order to decrease fall risk and improve function at home. Baseline:  Goal status: INITIAL   LONG TERM GOALS: Target date: 08/21/2023  Pt will improve ABC by at least 13% in order to demonstrate clinically significant improvement in balance confidence.  Baseline: 20% Goal status: INITIAL  2.  Pt will improve BERG by at least 3  points in order to demonstrate clinically significant improvement in balance.   Baseline: 40/56; Goal status: INITIAL  3.  Pt will increase self-selected by at least 0.13 m/s in order to demonstrate clinically significant improvement in community ambulation.         Baseline: self-selected: 0.49 m/s Goal status: INITIAL  4. Pt will be able to complete 5TSTS without UE support in order to demonstrate clinically significant improvement in LE strength      Baseline: Unable to perform sit to stand without heavy UE assist Goal status: INITIAL  5. Pt will decrease TUG to below 14 seconds/decrease in order to demonstrate decreased fall risk.  Baseline: 22.2s Goal status: INITIAL  PLAN: PT FREQUENCY: 2x/week  PT DURATION: 12 weeks  PLANNED INTERVENTIONS: Therapeutic exercises, Therapeutic activity, Neuromuscular re-education, Balance training, Gait training, Patient/Family education, Self Care, Joint mobilization, Joint manipulation, Vestibular training, Canalith repositioning, Orthotic/Fit training, DME instructions, Dry Needling, Electrical stimulation, Spinal manipulation, Spinal mobilization, Cryotherapy, Moist heat, Taping, Traction, Ultrasound, Ionotophoresis 4mg /ml Dexamethasone , Manual therapy, and Re-evaluation.  PLAN FOR NEXT SESSION: Progress strengthening and introduce balance exercises, modify/progress HEP as needed;    Annise Boran D Kalee Broxton PT, DPT, GCS  10:16 AM,06/23/23

## 2023-06-25 NOTE — Therapy (Signed)
 OUTPATIENT PHYSICAL THERAPY BALANCE TREATMENT   Patient Name: Sean Reyes. MRN: [id number] DOB:Aug 24, 1940, 83 y.o., male Today's Date: 06/26/2023  END OF SESSION:  PT End of Session - 06/26/23 1130     Visit Number 7    Number of Visits 25    Date for PT Re-Evaluation 08/21/23    Authorization Type eval: 05/29/23;    Authorization Time Period Medicare A&B 2025  VL: Based on [place]  No auth req    PT Start Time 1100    PT Stop Time 1145    PT Time Calculation (min) 45 min    Equipment Utilized During Treatment Gait belt    Activity Tolerance Patient tolerated treatment well;Patient limited by pain    Behavior During Therapy WFL for tasks assessed/performed            Past Medical History:  Diagnosis Date   Anemia    Anxiety    Arthritis    Arthritis of neck    Atrial fibrillation (HCC)    Cataracts, bilateral    Complication of anesthesia    pt reports low BP's after surgery at [facility] and difficulty awakening   Depression    Diabetes (HCC)    dx 6-8 yrs ago   Dysrhythmia    a-fib   GERD (gastroesophageal reflux disease)    OCC TAKES ALKA SELTZER   History of kidney stones    10-15 yrs ago   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   Hyperlipidemia    Hypertension    Myocardial infarction (HCC) 12/2020   Nocturia    S/P ablation of atrial fibrillation    Ablative therapy   Sleep apnea    CPAP    Spinal stenosis    Tachycardia, unspecified    Past Surgical History:  Procedure Laterality Date   ABLATION     ANTERIOR LAT LUMBAR FUSION N/A 06/27/2017   Procedure: Anterior Lateral Lumbar Interbody  Fusion - Lumbar Two-Lumbar Three - Lumbar Three-Lumbar Four, Posterior Lumbar Interbody Fusion Lumbar Four- Five;  Surgeon: Gearl Keens, MD;  Location: [facility] OR;  Service: Neurosurgery;  Laterality: N/A;  Anterior Lateral Lumbar Interbody  Fusion - Lumbar Two-Lumbar Three - Lumbar Three-Lumbar Four, Posterior Lumbar Interbody Fusion Lumbar Four- Five    BACK SURGERY     CARDIAC CATHETERIZATION     CARDIOVERSION N/A 08/29/2018   Procedure: CARDIOVERSION;  Surgeon: Michelle Aid, MD;  Location: [facility] ORS;  Service: Cardiovascular;  Laterality: N/A;   CARDIOVERSION N/A 09/24/2018   Procedure: CARDIOVERSION;  Surgeon: Michelle Aid, MD;  Location: [facility] ORS;  Service: Cardiovascular;  Laterality: N/A;   COLONOSCOPY WITH PROPOFOL  N/A 10/05/2015   Procedure: COLONOSCOPY WITH PROPOFOL ;  Surgeon: Deveron Fly, MD;  Location: [facility] ENDOSCOPY;  Service: Endoscopy;  Laterality: N/A;   COLONOSCOPY WITH PROPOFOL  N/A 11/01/2020   Procedure: COLONOSCOPY WITH PROPOFOL ;  Surgeon: Luke Salaam, MD;  Location: [facility] ENDOSCOPY;  Service: Gastroenterology;  Laterality: N/A;   CORONARY STENT INTERVENTION N/A 12/22/2020   Procedure: CORONARY STENT INTERVENTION;  Surgeon: Percival Brace, MD;  Location: [facility] INVASIVE CV LAB;  Service: Cardiovascular;  Laterality: N/A;   ESOPHAGOGASTRODUODENOSCOPY N/A 11/01/2020   Procedure: ESOPHAGOGASTRODUODENOSCOPY (EGD);  Surgeon: Luke Salaam, MD;  Location: [facility] ENDOSCOPY;  Service: Gastroenterology;  Laterality: N/A;   ESOPHAGOGASTRODUODENOSCOPY (EGD) WITH PROPOFOL  N/A 04/01/2018   Procedure: ESOPHAGOGASTRODUODENOSCOPY (EGD) WITH PROPOFOL ;  Surgeon: Deveron Fly, MD;  Location: [facility] ENDOSCOPY;  Service: Endoscopy;  Laterality: N/A;   ESOPHAGOGASTRODUODENOSCOPY (  EGD) WITH PROPOFOL  N/A 06/01/2022   Procedure: ESOPHAGOGASTRODUODENOSCOPY (EGD) WITH PROPOFOL ;  Surgeon: Luke Salaam, MD;  Location: [facility] ENDOSCOPY;  Service: Gastroenterology;  Laterality: N/A;   EYE SURGERY     HERNIA REPAIR     JOINT REPLACEMENT Bilateral    hips  RT+  LEFT X2    LEFT HEART CATH AND CORONARY ANGIOGRAPHY N/A 12/22/2020   Procedure: LEFT HEART CATH AND CORONARY ANGIOGRAPHY;  Surgeon: Percival Brace, MD;  Location: [facility] INVASIVE CV LAB;  Service: Cardiovascular;  Laterality: N/A;   LEFT HEART CATH AND CORONARY ANGIOGRAPHY Left  08/23/2021   Procedure: LEFT HEART CATH AND CORONARY ANGIOGRAPHY;  Surgeon: Michelle Aid, MD;  Location: [facility] INVASIVE CV LAB;  Service: Cardiovascular;  Laterality: Left;   LUMBAR LAMINECTOMY/DECOMPRESSION MICRODISCECTOMY Left 09/13/2016   Procedure: Microdiscectomy - Lumbar two-three,  Lumbar three- - left;  Surgeon: Gearl Keens, MD;  Location: [facility] OR;  Service: Neurosurgery;  Laterality: Left;   SPINAL CORD STIMULATOR INSERTION  07/08/2019   TONSILLECTOMY     Patient Active Problem List   Diagnosis Date Noted   Diabetes mellitus treated with oral medication (HCC) 04/05/2023   Finger numbness 10/13/2022   Subareolar mass of left breast 10/03/2022   COPD (chronic obstructive pulmonary disease) (HCC) 10/31/2021   Shortness of breath on exertion 08/04/2021   Diabetes mellitus with proteinuria (HCC) 04/04/2021   GERD without esophagitis 04/02/2021   Coronary artery disease 12/28/2020   History of non-ST elevation myocardial infarction (NSTEMI) 12/21/2020   Pain in right shin 10/25/2020   Peripheral vascular disease (HCC) 08/06/2020   Chronic pain syndrome 07/06/2020   Cervical facet joint syndrome 04/08/2020   Spinal cord stimulator status 12/11/2019   Elevated TSH 08/20/2019   CKD (chronic kidney disease) stage 3, GFR 30-59 ml/min (HCC) 01/19/2019   Acquired thrombophilia (HCC) 01/19/2019   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   Postlaminectomy syndrome, lumbar region 01/16/2019   History of fusion of lumbar spine (L2-L5) 01/16/2019   Chronic radicular lumbar pain 01/16/2019   HNP (herniated nucleus pulposus), lumbar 04/29/2018   Advanced care planning/counseling discussion 09/28/2016   Spinal stenosis, lumbar region, 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/13/2016   Hyperlipidemia associated with type 2 diabetes mellitus (HCC) 07/14/2015   Symptomatic anemia 06/30/2015   Benign prostatic hyperplasia without lower urinary tract symptoms 06/02/2015   OSA (obstructive sleep apnea)  03/23/2015   Hypertension associated with diabetes (HCC) 09/28/2014   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HCC) 09/28/2014   H/O prior ablation treatment 10/19/2011   AF (paroxysmal atrial fibrillation) (HCC) 10/19/2011   PCP: Lemar Pyles, NP  REFERRING PROVIDER: Rosan Comfort, MD   REFERRING DIAG: Peripheral Neuropathy  RATIONALE FOR EVALUATION AND TREATMENT: Rehabilitation  THERAPY DIAG: Difficulty in walking, not elsewhere classified  Muscle weakness (generalized)  ONSET DATE: 01/25/23 (acute on chronic)  FOLLOW-UP APPT SCHEDULED WITH REFERRING PROVIDER: Yes   FROM INITIAL EVALUATION SUBJECTIVE:  SUBJECTIVE STATEMENT:  Imbalance and LE weakness;  PERTINENT HISTORY:  Lower extremity weakness in patient with 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 He reports weakness in both legs and numbness in toes post surgery in 2018. He experienced a fall on 01/25/2023 at home, resulting in a L hip injury. No residual pain in hip at this time. No additional falls since December. He reports progressive LE weakness impairing his functional ability at home. He is known to this clinic from prior episodes of care for similar concerns.  Prior history: 11/02/22 Patient reports to physical therapy today with a chief concern of muscle weakness and imbalance. Patient reports progressive worsening in balance and muscular strength. He states that he has loss of sensation in his toes he reports they  are "asleep". Patient reports pain in the anterior thigh and bilateral calves. He currently uses an loftstand crutch in his RUE. Lately patient has reported difficulty with dressing LE, performing fine motor movements with left hand. He denies falls, saddle parasthesia, nausea, vomitting, night sweats.    10/17/2022 Patient reported he is not doing well lately. He has been seeing Dr. Mont Antis. He reports his legs are so weak that he can hardly walk, so he has been using a crutch for the last 1-2 years. Reported numbness and loss of sensation in his toes. He also reported that recently his left hand has began to has some numbness to where it feels like it is asleep. Denies pain, loss of sensation, coldness, burning. Denies falls."  Imaging: NCS conducted on 11/12/2018: Abnormal study. 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 There is also preliminary evidence of a superimposed left lower lumbosacral polyradiculopathy, based on NCV and distal needle exam results. I cannot test lumbar paraspinals due to patient on Eliquis .    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 Yes. 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  Recent memory is intact.  Remote memory is intact.  Attention span and concentration are intact.  Expressive speech is intact.  Patient's fund of knowledge is  within normal limits for educational level.    Gross Musculoskeletal Assessment Tremor: None Bulk: Normal Tone: Normal  Posture: Forward head and rounded shoulders  AROM Deferred specific measurements. Functional motion intact;  LE MMT: MMT (out of 5) Right  Left   Hip flexion 4 4+  Hip extension    Hip abduction (seated) 3+ 3+  Hip adduction (seated 3+ 3+  Hip internal rotation    Hip external rotation    Knee flexion (seated) 4 4  Knee extension 4 4  Ankle dorsiflexion 4 4  Ankle plantarflexion    Ankle inversion    Ankle eversion    (* = pain; Blank rows = not tested)  Transfers: Assistive device utilized: Single Lofstrand RUE  Sit to stand: Modified independence Stand to sit: Modified independence Chair to chair: Modified independence Floor: Deferred  Stairs: Level of Assistance: CGA Stair Negotiation Technique: Alternating Pattern  with Bilateral Rails Number of Stairs: 4  Height of Stairs: 6"  Comments: Slow and labored ascending/descending. No overt LOB;  Gait: Gait pattern: decreased step length- Right, decreased step length- Left, trunk flexed, poor foot clearance- Right, and poor foot clearance- Left Distance walked: 150' over the course of evaluation Assistive device  utilized: Single Lofstrand in RUE Level of assistance: Modified independence Comments: Slow and labored ambulation. Decreased self selected speed  Functional Outcome Measures  Results Comments  BERG 40/56 Increased fall risk  DGI    FGA    TUG 22.2 seconds Increased fall risk  5TSTS Unable Increased fall risk  6 Minute Walk Test    10 Meter Gait Speed Self-selected: 20.5s = 0.49 m/s; Fastest: 18.0s = 0.56 m/s Below community ambulation speed  (Blank rows = not tested)   TODAY'S TREATMENT    SUBJECTIVE: Pt reports that he is doing alright today. He was very sore after the last therapy session to the point where it was difficult to walk. He feels like his legs are getting weaker. Ongoing chronic low back pain but no rating provided today.   PAIN: Chronic low back pain.   Ther-ex  NuStep (seat 12, hands 13) L0-4 x 10 minutes for BLE strengthening and warm-up during interval history (5 minutes unbilled); Double black tband resisted gait in // bars forward, backward, R lateral, and L lateral x 3 each direction; Sit to stand from regular height chair without UE support 2 x 10; Seated clams with manual resistance 2 x 10 BLE; Seated adductor squeezes with manual resistance 2 x 10 BLE;   Not performed:  Side stepping with 5# ankle weights x multiple lengths; Seated LAQ with 5# ankle weights 2 x 10 BLE; Standing hip strengthening with 5# ankle weights: Hip flexion marches 2 x 10 BLE; HS curls 2 x 10 BLE; Hip abduction 2 x 10 BLE; Hip extension 2 x 10 BLE;    PATIENT EDUCATION:  Education details: Pt educated throughout session about proper posture and technique with exercises. Improved exercise technique, movement at target joints, use of target muscles after min to mod verbal, visual, tactile cues.  Person educated: Patient Education method: Explanation, Verbal cues, and Handouts Education comprehension: verbalized understanding and returned demonstration   HOME EXERCISE  PROGRAM:  Access Code: [email or web address] URL: [email or web address] Date: 05/31/2023 Prepared by: Crawford Dock  Exercises - Seated Long Arc Quad  - 1 x daily - 3-4 x weekly - 3 sets - 10 reps - 2s hold - Seated Hip Abduction with Resistance  - 1 x daily - 3-4 x weekly -  3 sets - 10 reps - 2s hold - Heel Raises with Counter Support  - 1 x daily - 3-4 x weekly - 3 sets - 10 reps - 2s hold - Mini Squat with Counter Support  - 1 x daily - 3-4 x weekly - 3 sets - 10 reps - Semi-Tandem Balance at [employer] Eyes Open  - 1 x daily - 7 x weekly - 2-3 sets - 10 reps - 10-30 hold   ASSESSMENT:  CLINICAL IMPRESSION: Progressed strengthening exercises during session today with patient. Deferred ankle weights today due to excessive soreness after last therapy session. No HEP modifications at this time. Pt encouraged to follow-up as scheduled. Will introduce balance exercises at future session. He will benefit from PT services to address deficits in strength, balance, and mobility in order to return to full function at home and decrease his risk for falls.      OBJECTIVE IMPAIRMENTS: Abnormal gait, decreased balance, difficulty walking, decreased strength, impaired sensation, and pain.   ACTIVITY LIMITATIONS: bending, standing, squatting, stairs, transfers, and caring for others  PARTICIPATION LIMITATIONS: meal prep, cleaning, laundry, shopping, and community activity  PERSONAL FACTORS: Age, Past/current experiences, Time since onset of injury/illness/exacerbation, and 3+ comorbidities: generalized polyneuropathy, COPD, DM, NSTEMI, and spinal stenosis are also affecting patient's functional outcome.   REHAB POTENTIAL: Fair    CLINICAL DECISION MAKING: Unstable/unpredictable  EVALUATION COMPLEXITY: High   GOALS: Goals reviewed with patient? No  SHORT TERM GOALS: Target date: 07/10/2023  Pt will be independent with HEP in order to improve strength and balance in order to decrease fall  risk and improve function at home. Baseline:  Goal status: INITIAL   LONG TERM GOALS: Target date: 08/21/2023  Pt will improve ABC by at least 13% in order to demonstrate clinically significant improvement in balance confidence.  Baseline: 20% Goal status: INITIAL  2.  Pt will improve BERG by at least 3 points in order to demonstrate clinically significant improvement in balance.   Baseline: 40/56; Goal status: INITIAL  3.  Pt will increase self-selected by at least 0.13 m/s in order to demonstrate clinically significant improvement in community ambulation.         Baseline: self-selected: 0.49 m/s Goal status: INITIAL  4. Pt will be able to complete 5TSTS without UE support in order to demonstrate clinically significant improvement in LE strength      Baseline: Unable to perform sit to stand without heavy UE assist Goal status: INITIAL  5. Pt will decrease TUG to below 14 seconds/decrease in order to demonstrate decreased fall risk.  Baseline: 22.2s Goal status: INITIAL  PLAN: PT FREQUENCY: 2x/week  PT DURATION: 12 weeks  PLANNED INTERVENTIONS: Therapeutic exercises, Therapeutic activity, Neuromuscular re-education, Balance training, Gait training, Patient/Family education, Self Care, Joint mobilization, Joint manipulation, Vestibular training, Canalith repositioning, Orthotic/Fit training, DME instructions, Dry Needling, Electrical stimulation, Spinal manipulation, Spinal mobilization, Cryotherapy, Moist heat, Taping, Traction, Ultrasound, Ionotophoresis 4mg /ml Dexamethasone , Manual therapy, and Re-evaluation.  PLAN FOR NEXT SESSION: Progress strengthening and introduce balance exercises, modify/progress HEP as needed;    Nakayla Rorabaugh D Meghann [place] PT, DPT, GCS  4:10 PM,06/26/23

## 2023-06-26 ENCOUNTER — Ambulatory Visit

## 2023-06-26 DIAGNOSIS — R2681 Unsteadiness on feet: Secondary | ICD-10-CM | POA: Diagnosis not present

## 2023-06-26 DIAGNOSIS — M6281 Muscle weakness (generalized): Secondary | ICD-10-CM

## 2023-06-26 DIAGNOSIS — M5459 Other low back pain: Secondary | ICD-10-CM | POA: Diagnosis not present

## 2023-06-26 DIAGNOSIS — R262 Difficulty in walking, not elsewhere classified: Secondary | ICD-10-CM

## 2023-06-27 NOTE — Therapy (Signed)
 OUTPATIENT PHYSICAL THERAPY BALANCE TREATMENT   Patient Name: Sean Reyes. MRN: [id number] DOB:07-28-1940, 83 y.o., male Today's Date: 06/28/2023  END OF SESSION:  PT End of Session - 06/28/23 1337     Visit Number 8    Number of Visits 25    Date for PT Re-Evaluation 08/21/23    Authorization Type eval: 05/29/23;    Authorization Time Period Medicare A&B 2025  VL: Based on [place]  No auth req    PT Start Time 1058    PT Stop Time 1141    PT Time Calculation (min) 43 min    Equipment Utilized During Treatment Gait belt    Activity Tolerance Patient tolerated treatment well;Patient limited by pain    Behavior During Therapy WFL for tasks assessed/performed             Past Medical History:  Diagnosis Date   Anemia    Anxiety    Arthritis    Arthritis of neck    Atrial fibrillation (HCC)    Cataracts, bilateral    Complication of anesthesia    pt reports low BP's after surgery at [facility] and difficulty awakening   Depression    Diabetes (HCC)    dx 6-8 yrs ago   Dysrhythmia    a-fib   GERD (gastroesophageal reflux disease)    OCC TAKES ALKA SELTZER   History of kidney stones    10-15 yrs ago   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   Hyperlipidemia    Hypertension    Myocardial infarction (HCC) 12/2020   Nocturia    S/P ablation of atrial fibrillation    Ablative therapy   Sleep apnea    CPAP    Spinal stenosis    Tachycardia, unspecified    Past Surgical History:  Procedure Laterality Date   ABLATION     ANTERIOR LAT LUMBAR FUSION N/A 06/27/2017   Procedure: Anterior Lateral Lumbar Interbody  Fusion - Lumbar Two-Lumbar Three - Lumbar Three-Lumbar Four, Posterior Lumbar Interbody Fusion Lumbar Four- Five;  Surgeon: Gearl Keens, MD;  Location: [facility] OR;  Service: Neurosurgery;  Laterality: N/A;  Anterior Lateral Lumbar Interbody  Fusion - Lumbar Two-Lumbar Three - Lumbar Three-Lumbar Four, Posterior Lumbar Interbody Fusion Lumbar Four- Five    BACK SURGERY     CARDIAC CATHETERIZATION     CARDIOVERSION N/A 08/29/2018   Procedure: CARDIOVERSION;  Surgeon: Michelle Aid, MD;  Location: [facility] ORS;  Service: Cardiovascular;  Laterality: N/A;   CARDIOVERSION N/A 09/24/2018   Procedure: CARDIOVERSION;  Surgeon: Michelle Aid, MD;  Location: [facility] ORS;  Service: Cardiovascular;  Laterality: N/A;   COLONOSCOPY WITH PROPOFOL  N/A 10/05/2015   Procedure: COLONOSCOPY WITH PROPOFOL ;  Surgeon: Deveron Fly, MD;  Location: [facility] ENDOSCOPY;  Service: Endoscopy;  Laterality: N/A;   COLONOSCOPY WITH PROPOFOL  N/A 11/01/2020   Procedure: COLONOSCOPY WITH PROPOFOL ;  Surgeon: Luke Salaam, MD;  Location: [facility] ENDOSCOPY;  Service: Gastroenterology;  Laterality: N/A;   CORONARY STENT INTERVENTION N/A 12/22/2020   Procedure: CORONARY STENT INTERVENTION;  Surgeon: Percival Brace, MD;  Location: [facility] INVASIVE CV LAB;  Service: Cardiovascular;  Laterality: N/A;   ESOPHAGOGASTRODUODENOSCOPY N/A 11/01/2020   Procedure: ESOPHAGOGASTRODUODENOSCOPY (EGD);  Surgeon: Luke Salaam, MD;  Location: [facility] ENDOSCOPY;  Service: Gastroenterology;  Laterality: N/A;   ESOPHAGOGASTRODUODENOSCOPY (EGD) WITH PROPOFOL  N/A 04/01/2018   Procedure: ESOPHAGOGASTRODUODENOSCOPY (EGD) WITH PROPOFOL ;  Surgeon: Deveron Fly, MD;  Location: [facility] ENDOSCOPY;  Service: Endoscopy;  Laterality: N/A;  ESOPHAGOGASTRODUODENOSCOPY (EGD) WITH PROPOFOL  N/A 06/01/2022   Procedure: ESOPHAGOGASTRODUODENOSCOPY (EGD) WITH PROPOFOL ;  Surgeon: Luke Salaam, MD;  Location: [facility] ENDOSCOPY;  Service: Gastroenterology;  Laterality: N/A;   EYE SURGERY     HERNIA REPAIR     JOINT REPLACEMENT Bilateral    hips  RT+  LEFT X2    LEFT HEART CATH AND CORONARY ANGIOGRAPHY N/A 12/22/2020   Procedure: LEFT HEART CATH AND CORONARY ANGIOGRAPHY;  Surgeon: Percival Brace, MD;  Location: [facility] INVASIVE CV LAB;  Service: Cardiovascular;  Laterality: N/A;   LEFT HEART CATH AND CORONARY ANGIOGRAPHY Left  08/23/2021   Procedure: LEFT HEART CATH AND CORONARY ANGIOGRAPHY;  Surgeon: Michelle Aid, MD;  Location: [facility] INVASIVE CV LAB;  Service: Cardiovascular;  Laterality: Left;   LUMBAR LAMINECTOMY/DECOMPRESSION MICRODISCECTOMY Left 09/13/2016   Procedure: Microdiscectomy - Lumbar two-three,  Lumbar three- - left;  Surgeon: Gearl Keens, MD;  Location: [facility] OR;  Service: Neurosurgery;  Laterality: Left;   SPINAL CORD STIMULATOR INSERTION  07/08/2019   TONSILLECTOMY     Patient Active Problem List   Diagnosis Date Noted   Diabetes mellitus treated with oral medication (HCC) 04/05/2023   Finger numbness 10/13/2022   Subareolar mass of left breast 10/03/2022   COPD (chronic obstructive pulmonary disease) (HCC) 10/31/2021   Shortness of breath on exertion 08/04/2021   Diabetes mellitus with proteinuria (HCC) 04/04/2021   GERD without esophagitis 04/02/2021   Coronary artery disease 12/28/2020   History of non-ST elevation myocardial infarction (NSTEMI) 12/21/2020   Pain in right shin 10/25/2020   Peripheral vascular disease (HCC) 08/06/2020   Chronic pain syndrome 07/06/2020   Cervical facet joint syndrome 04/08/2020   Spinal cord stimulator status 12/11/2019   Elevated TSH 08/20/2019   CKD (chronic kidney disease) stage 3, GFR 30-59 ml/min (HCC) 01/19/2019   Acquired thrombophilia (HCC) 01/19/2019   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   Postlaminectomy syndrome, lumbar region 01/16/2019   History of fusion of lumbar spine (L2-L5) 01/16/2019   Chronic radicular lumbar pain 01/16/2019   HNP (herniated nucleus pulposus), lumbar 04/29/2018   Advanced care planning/counseling discussion 09/28/2016   Spinal stenosis, lumbar region, 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/13/2016   Hyperlipidemia associated with type 2 diabetes mellitus (HCC) 07/14/2015   Symptomatic anemia 06/30/2015   Benign prostatic hyperplasia without lower urinary tract symptoms 06/02/2015   OSA (obstructive sleep apnea)  03/23/2015   Hypertension associated with diabetes (HCC) 09/28/2014   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HCC) 09/28/2014   H/O prior ablation treatment 10/19/2011   AF (paroxysmal atrial fibrillation) (HCC) 10/19/2011   PCP: Lemar Pyles, NP  REFERRING PROVIDER: Rosan Comfort, MD   REFERRING DIAG: Peripheral Neuropathy  RATIONALE FOR EVALUATION AND TREATMENT: Rehabilitation  THERAPY DIAG: Difficulty in walking, not elsewhere classified  Muscle weakness (generalized)  ONSET DATE: 01/25/23 (acute on chronic)  FOLLOW-UP APPT SCHEDULED WITH REFERRING PROVIDER: Yes   FROM INITIAL EVALUATION SUBJECTIVE:  SUBJECTIVE STATEMENT:  Imbalance and LE weakness;  PERTINENT HISTORY:  Lower extremity weakness in patient with 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 He reports weakness in both legs and numbness in toes post surgery in 2018. He experienced a fall on 01/25/2023 at home, resulting in a L hip injury. No residual pain in hip at this time. No additional falls since December. He reports progressive LE weakness impairing his functional ability at home. He is known to this clinic from prior episodes of care for similar concerns.  Prior history: 11/02/22 Patient reports to physical therapy today with a chief concern of muscle weakness and imbalance. Patient reports progressive worsening in balance and muscular strength. He states that he has loss of sensation in his toes he reports they  are "asleep". Patient reports pain in the anterior thigh and bilateral calves. He currently uses an loftstand crutch in his RUE. Lately patient has reported difficulty with dressing LE, performing fine motor movements with left hand. He denies falls, saddle parasthesia, nausea, vomitting, night sweats.    10/17/2022 Patient reported he is not doing well lately. He has been seeing Dr. Mont Antis. He reports his legs are so weak that he can hardly walk, so he has been using a crutch for the last 1-2 years. Reported numbness and loss of sensation in his toes. He also reported that recently his left hand has began to has some numbness to where it feels like it is asleep. Denies pain, loss of sensation, coldness, burning. Denies falls."  Imaging: NCS conducted on 11/12/2018: Abnormal study. 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 There is also preliminary evidence of a superimposed left lower lumbosacral polyradiculopathy, based on NCV and distal needle exam results. I cannot test lumbar paraspinals due to patient on Eliquis .    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 Yes. 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  Recent memory is intact.  Remote memory is intact.  Attention span and concentration are intact.  Expressive speech is intact.  Patient's fund of knowledge is  within normal limits for educational level.    Gross Musculoskeletal Assessment Tremor: None Bulk: Normal Tone: Normal  Posture: Forward head and rounded shoulders  AROM Deferred specific measurements. Functional motion intact;  LE MMT: MMT (out of 5) Right  Left   Hip flexion 4 4+  Hip extension    Hip abduction (seated) 3+ 3+  Hip adduction (seated 3+ 3+  Hip internal rotation    Hip external rotation    Knee flexion (seated) 4 4  Knee extension 4 4  Ankle dorsiflexion 4 4  Ankle plantarflexion    Ankle inversion    Ankle eversion    (* = pain; Blank rows = not tested)  Transfers: Assistive device utilized: Single Lofstrand RUE  Sit to stand: Modified independence Stand to sit: Modified independence Chair to chair: Modified independence Floor: Deferred  Stairs: Level of Assistance: CGA Stair Negotiation Technique: Alternating Pattern  with Bilateral Rails Number of Stairs: 4  Height of Stairs: 6"  Comments: Slow and labored ascending/descending. No overt LOB;  Gait: Gait pattern: decreased step length- Right, decreased step length- Left, trunk flexed, poor foot clearance- Right, and poor foot clearance- Left Distance walked: 150' over the course of evaluation Assistive device  utilized: Single Lofstrand in RUE Level of assistance: Modified independence Comments: Slow and labored ambulation. Decreased self selected speed  Functional Outcome Measures  Results Comments  BERG 40/56 Increased fall risk  DGI    FGA    TUG 22.2 seconds Increased fall risk  5TSTS Unable Increased fall risk  6 Minute Walk Test    10 Meter Gait Speed Self-selected: 20.5s = 0.49 m/s; Fastest: 18.0s = 0.56 m/s Below community ambulation speed  (Blank rows = not tested)   TODAY'S TREATMENT    SUBJECTIVE: Pt reports that he is doing alright today. He was very sore after the last therapy session to the point where it was difficult to walk. He feels like his legs are getting weaker. Ongoing chronic low back pain but no rating provided today.   PAIN: Chronic low back pain.   Ther-ex  NuStep (seat 12, hands 13) L0-4 x 10 minutes for BLE strengthening and warm-up during interval history (5 minutes unbilled);  Side stepping with 3# ankle weights x multiple lengths; Seated LAQ with 3# ankle weights x 10 BLE; Standing hip strengthening with 5# ankle weights: Hip flexion marches x 10 BLE; HS curls x 10 BLE; Hip abduction x 10 BLE; Hip extension x 10 BLE;  Seated clams with manual resistance x 10 BLE; Seated adductor squeezes with manual resistance x 10 BLE; Mini squats 2 x 10;   PATIENT EDUCATION:  Education details: Pt educated throughout session about proper posture and technique with exercises. Improved exercise technique, movement at target joints, use of target muscles after min to mod verbal, visual, tactile cues.  Person educated: Patient Education method: Explanation, Verbal cues, and Handouts Education comprehension: verbalized understanding and returned demonstration   HOME EXERCISE PROGRAM:  Access Code: [email or web address] URL: [email or web address] Date: 05/31/2023 Prepared by: Crawford Dock  Exercises - Seated Long Arc Quad  - 1 x daily - 3-4 x weekly - 3  sets - 10 reps - 2s hold - Seated Hip Abduction with Resistance  - 1 x daily - 3-4 x weekly - 3 sets - 10 reps - 2s hold - Heel Raises with Counter Support  - 1 x daily - 3-4 x weekly - 3 sets - 10 reps - 2s hold - Mini Squat with Counter  Support  - 1 x daily - 3-4 x weekly - 3 sets - 10 reps - Semi-Tandem Balance at [employer] Eyes Open  - 1 x daily - 7 x weekly - 2-3 sets - 10 reps - 10-30 hold   ASSESSMENT:  CLINICAL IMPRESSION: Progressed strengthening exercises during session today with patient. Returned to using ankle weights for hip and knee strengthening. No HEP modifications at this time. Pt encouraged to follow-up as scheduled. Will introduce balance exercises at future session as necessary/appropriate. Currently his LE weakness is the largest limitation. He will benefit from PT services to address deficits in strength, balance, and mobility in order to return to full function at home and decrease his risk for falls.      OBJECTIVE IMPAIRMENTS: Abnormal gait, decreased balance, difficulty walking, decreased strength, impaired sensation, and pain.   ACTIVITY LIMITATIONS: bending, standing, squatting, stairs, transfers, and caring for others  PARTICIPATION LIMITATIONS: meal prep, cleaning, laundry, shopping, and community activity  PERSONAL FACTORS: Age, Past/current experiences, Time since onset of injury/illness/exacerbation, and 3+ comorbidities: generalized polyneuropathy, COPD, DM, NSTEMI, and spinal stenosis are also affecting patient's functional outcome.   REHAB POTENTIAL: Fair    CLINICAL DECISION MAKING: Unstable/unpredictable  EVALUATION COMPLEXITY: High   GOALS: Goals reviewed with patient? No  SHORT TERM GOALS: Target date: 07/10/2023  Pt will be independent with HEP in order to improve strength and balance in order to decrease fall risk and improve function at home. Baseline:  Goal status: INITIAL   LONG TERM GOALS: Target date: 08/21/2023  Pt will improve  ABC by at least 13% in order to demonstrate clinically significant improvement in balance confidence.  Baseline: 20% Goal status: INITIAL  2.  Pt will improve BERG by at least 3 points in order to demonstrate clinically significant improvement in balance.   Baseline: 40/56; Goal status: INITIAL  3.  Pt will increase self-selected by at least 0.13 m/s in order to demonstrate clinically significant improvement in community ambulation.         Baseline: self-selected: 0.49 m/s Goal status: INITIAL  4. Pt will be able to complete 5TSTS without UE support in order to demonstrate clinically significant improvement in LE strength      Baseline: Unable to perform sit to stand without heavy UE assist Goal status: INITIAL  5. Pt will decrease TUG to below 14 seconds/decrease in order to demonstrate decreased fall risk.  Baseline: 22.2s Goal status: INITIAL  PLAN: PT FREQUENCY: 2x/week  PT DURATION: 12 weeks  PLANNED INTERVENTIONS: Therapeutic exercises, Therapeutic activity, Neuromuscular re-education, Balance training, Gait training, Patient/Family education, Self Care, Joint mobilization, Joint manipulation, Vestibular training, Canalith repositioning, Orthotic/Fit training, DME instructions, Dry Needling, Electrical stimulation, Spinal manipulation, Spinal mobilization, Cryotherapy, Moist heat, Taping, Traction, Ultrasound, Ionotophoresis 4mg /ml Dexamethasone , Manual therapy, and Re-evaluation.  PLAN FOR NEXT SESSION: Progress strengthening and introduce balance exercises, modify/progress HEP as needed;    Seanmichael Salmons D Brendaly Townsel PT, DPT, GCS  1:41 PM,06/28/23

## 2023-06-28 ENCOUNTER — Ambulatory Visit

## 2023-06-28 DIAGNOSIS — R2681 Unsteadiness on feet: Secondary | ICD-10-CM | POA: Diagnosis not present

## 2023-06-28 DIAGNOSIS — M6281 Muscle weakness (generalized): Secondary | ICD-10-CM | POA: Diagnosis not present

## 2023-06-28 DIAGNOSIS — R262 Difficulty in walking, not elsewhere classified: Secondary | ICD-10-CM | POA: Diagnosis not present

## 2023-06-28 DIAGNOSIS — M5459 Other low back pain: Secondary | ICD-10-CM | POA: Diagnosis not present

## 2023-06-28 NOTE — Therapy (Signed)
 OUTPATIENT PHYSICAL THERAPY BALANCE TREATMENT   Patient Name: Sean Reyes. MRN: [id number] DOB:05-25-1940, 83 y.o., male Today's Date: 07/03/2023  END OF SESSION:  PT End of Session - 07/03/23 1144     Visit Number 9    Number of Visits 25    Date for PT Re-Evaluation 08/21/23    Authorization Type eval: 05/29/23;    Authorization Time Period Medicare A&B 2025  VL: Based on MN  No auth req    PT Start Time 0932    PT Stop Time 1017    PT Time Calculation (min) 45 min    Equipment Utilized During Treatment Gait belt    Activity Tolerance Patient tolerated treatment well;Patient limited by pain    Behavior During Therapy WFL for tasks assessed/performed            Past Medical History:  Diagnosis Date   Anemia    Anxiety    Arthritis    Arthritis of neck    Atrial fibrillation (HCC)    Cataracts, bilateral    Complication of anesthesia    pt reports low BP's after surgery at [facility] and difficulty awakening   Depression    Diabetes (HCC)    dx 6-8 yrs ago   Dysrhythmia    a-fib   GERD (gastroesophageal reflux disease)    OCC TAKES ALKA SELTZER   History of kidney stones    10-15 yrs ago   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   Hyperlipidemia    Hypertension    Myocardial infarction (HCC) 12/2020   Nocturia    S/P ablation of atrial fibrillation    Ablative therapy   Sleep apnea    CPAP    Spinal stenosis    Tachycardia, unspecified    Past Surgical History:  Procedure Laterality Date   ABLATION     ANTERIOR LAT LUMBAR FUSION N/A 06/27/2017   Procedure: Anterior Lateral Lumbar Interbody  Fusion - Lumbar Two-Lumbar Three - Lumbar Three-Lumbar Four, Posterior Lumbar Interbody Fusion Lumbar Four- Five;  Surgeon: Gearl Keens, MD;  Location: [facility] OR;  Service: Neurosurgery;  Laterality: N/A;  Anterior Lateral Lumbar Interbody  Fusion - Lumbar Two-Lumbar Three - Lumbar Three-Lumbar Four, Posterior Lumbar Interbody Fusion Lumbar Four- Five    BACK SURGERY     CARDIAC CATHETERIZATION     CARDIOVERSION N/A 08/29/2018   Procedure: CARDIOVERSION;  Surgeon: Michelle Aid, MD;  Location: [facility] ORS;  Service: Cardiovascular;  Laterality: N/A;   CARDIOVERSION N/A 09/24/2018   Procedure: CARDIOVERSION;  Surgeon: Michelle Aid, MD;  Location: [facility] ORS;  Service: Cardiovascular;  Laterality: N/A;   COLONOSCOPY WITH PROPOFOL  N/A 10/05/2015   Procedure: COLONOSCOPY WITH PROPOFOL ;  Surgeon: Deveron Fly, MD;  Location: [facility] ENDOSCOPY;  Service: Endoscopy;  Laterality: N/A;   COLONOSCOPY WITH PROPOFOL  N/A 11/01/2020   Procedure: COLONOSCOPY WITH PROPOFOL ;  Surgeon: Luke Salaam, MD;  Location: [facility] ENDOSCOPY;  Service: Gastroenterology;  Laterality: N/A;   CORONARY STENT INTERVENTION N/A 12/22/2020   Procedure: CORONARY STENT INTERVENTION;  Surgeon: Percival Brace, MD;  Location: [facility] INVASIVE CV LAB;  Service: Cardiovascular;  Laterality: N/A;   ESOPHAGOGASTRODUODENOSCOPY N/A 11/01/2020   Procedure: ESOPHAGOGASTRODUODENOSCOPY (EGD);  Surgeon: Luke Salaam, MD;  Location: [facility] ENDOSCOPY;  Service: Gastroenterology;  Laterality: N/A;   ESOPHAGOGASTRODUODENOSCOPY (EGD) WITH PROPOFOL  N/A 04/01/2018   Procedure: ESOPHAGOGASTRODUODENOSCOPY (EGD) WITH PROPOFOL ;  Surgeon: Deveron Fly, MD;  Location: [facility] ENDOSCOPY;  Service: Endoscopy;  Laterality: N/A;   ESOPHAGOGASTRODUODENOSCOPY (  EGD) WITH PROPOFOL  N/A 06/01/2022   Procedure: ESOPHAGOGASTRODUODENOSCOPY (EGD) WITH PROPOFOL ;  Surgeon: Luke Salaam, MD;  Location: [facility] ENDOSCOPY;  Service: Gastroenterology;  Laterality: N/A;   EYE SURGERY     HERNIA REPAIR     JOINT REPLACEMENT Bilateral    hips  RT+  LEFT X2    LEFT HEART CATH AND CORONARY ANGIOGRAPHY N/A 12/22/2020   Procedure: LEFT HEART CATH AND CORONARY ANGIOGRAPHY;  Surgeon: Percival Brace, MD;  Location: [facility] INVASIVE CV LAB;  Service: Cardiovascular;  Laterality: N/A;   LEFT HEART CATH AND CORONARY ANGIOGRAPHY Left  08/23/2021   Procedure: LEFT HEART CATH AND CORONARY ANGIOGRAPHY;  Surgeon: Michelle Aid, MD;  Location: [facility] INVASIVE CV LAB;  Service: Cardiovascular;  Laterality: Left;   LUMBAR LAMINECTOMY/DECOMPRESSION MICRODISCECTOMY Left 09/13/2016   Procedure: Microdiscectomy - Lumbar two-three,  Lumbar three- - left;  Surgeon: Gearl Keens, MD;  Location: [facility] OR;  Service: Neurosurgery;  Laterality: Left;   SPINAL CORD STIMULATOR INSERTION  07/08/2019   TONSILLECTOMY     Patient Active Problem List   Diagnosis Date Noted   Diabetes mellitus treated with oral medication (HCC) 04/05/2023   Subareolar mass of left breast 10/03/2022   COPD (chronic obstructive pulmonary disease) (HCC) 10/31/2021   Shortness of breath on exertion 08/04/2021   Diabetes mellitus with proteinuria (HCC) 04/04/2021   GERD without esophagitis 04/02/2021   Coronary artery disease 12/28/2020   History of non-ST elevation myocardial infarction (NSTEMI) 12/21/2020   Pain in right shin 10/25/2020   Peripheral vascular disease (HCC) 08/06/2020   Chronic pain syndrome 07/06/2020   Cervical facet joint syndrome 04/08/2020   Spinal cord stimulator status 12/11/2019   Elevated TSH 08/20/2019   CKD (chronic kidney disease) stage 3, GFR 30-59 ml/min (HCC) 01/19/2019   Acquired thrombophilia (HCC) 01/19/2019   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   Postlaminectomy syndrome, lumbar region 01/16/2019   History of fusion of lumbar spine (L2-L5) 01/16/2019   Chronic radicular lumbar pain 01/16/2019   HNP (herniated nucleus pulposus), lumbar 04/29/2018   Advanced care planning/counseling discussion 09/28/2016   Spinal stenosis, lumbar region, 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/13/2016   Hyperlipidemia associated with type 2 diabetes mellitus (HCC) 07/14/2015   Symptomatic anemia 06/30/2015   Benign prostatic hyperplasia without lower urinary tract symptoms 06/02/2015   OSA (obstructive sleep apnea) 03/23/2015   Hypertension  associated with diabetes (HCC) 09/28/2014   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HCC) 09/28/2014   H/O prior ablation treatment 10/19/2011   AF (paroxysmal atrial fibrillation) (HCC) 10/19/2011   PCP: Lemar Pyles, NP  REFERRING PROVIDER: Rosan Comfort, MD   REFERRING DIAG: Peripheral Neuropathy  RATIONALE FOR EVALUATION AND TREATMENT: Rehabilitation  THERAPY DIAG: Difficulty in walking, not elsewhere classified  Muscle weakness (generalized)  ONSET DATE: 01/25/23 (acute on chronic)  FOLLOW-UP APPT SCHEDULED WITH REFERRING PROVIDER: Yes   FROM INITIAL EVALUATION SUBJECTIVE:  SUBJECTIVE STATEMENT:  Imbalance and LE weakness;  PERTINENT HISTORY:  Lower extremity weakness in patient with 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 He reports weakness in both legs and numbness in toes post surgery in 2018. He experienced a fall on 01/25/2023 at home, resulting in a L hip injury. No residual pain in hip at this time. No additional falls since December. He reports progressive LE weakness impairing his functional ability at home. He is known to this clinic from prior episodes of care for similar concerns.  Prior history: 11/02/22 Patient reports to physical therapy today with a chief concern of muscle weakness and imbalance. Patient reports progressive worsening in balance and muscular strength. He states that he has loss of sensation in his toes he reports they are "asleep". Patient reports  pain in the anterior thigh and bilateral calves. He currently uses an loftstand crutch in his RUE. Lately patient has reported difficulty with dressing LE, performing fine motor movements with left hand. He denies falls, saddle parasthesia, nausea, vomitting, night sweats.    10/17/2022 Patient reported he is not doing well lately. He has been seeing Dr. Mont Antis. He reports his legs are so weak that he can hardly walk, so he has been using a crutch for the last 1-2 years. Reported numbness and loss of sensation in his toes. He also reported that recently his left hand has began to has some numbness to where it feels like it is asleep. Denies pain, loss of sensation, coldness, burning. Denies falls."  Imaging: NCS conducted on 11/12/2018: Abnormal study. 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 There is also preliminary evidence of a superimposed left lower lumbosacral polyradiculopathy, based on NCV and distal needle exam results. I cannot test lumbar paraspinals due to patient on Eliquis .    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 Yes. 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  Recent memory is intact.  Remote memory is intact.  Attention span and concentration are intact.  Expressive speech is intact.  Patient's fund of knowledge is  within normal limits for educational level.    Gross Musculoskeletal Assessment Tremor: None Bulk: Normal Tone: Normal  Posture: Forward head and rounded shoulders  AROM Deferred specific measurements. Functional motion intact;  LE MMT: MMT (out of 5) Right  Left   Hip flexion 4 4+  Hip extension    Hip abduction (seated) 3+ 3+  Hip adduction (seated 3+ 3+  Hip internal rotation    Hip external rotation    Knee flexion (seated) 4 4  Knee extension 4 4  Ankle dorsiflexion 4 4  Ankle plantarflexion    Ankle inversion    Ankle eversion    (* = pain; Blank rows = not tested)  Transfers: Assistive device utilized: Single Lofstrand RUE  Sit to stand: Modified independence Stand to sit: Modified independence Chair to chair: Modified independence Floor: Deferred  Stairs: Level of Assistance: CGA Stair Negotiation Technique: Alternating Pattern  with Bilateral Rails Number of Stairs: 4  Height of Stairs: 6"  Comments: Slow and labored ascending/descending. No overt LOB;  Gait: Gait pattern: decreased step length- Right, decreased step length- Left, trunk flexed, poor foot clearance- Right, and poor foot clearance- Left Distance walked: 150' over the course of evaluation Assistive device utilized: Single Lofstrand in  RUE Level of assistance: Modified independence Comments: Slow and labored ambulation. Decreased self selected speed  Functional Outcome Measures  Results Comments  BERG 40/56 Increased fall risk  DGI    FGA    TUG 22.2 seconds Increased fall risk  5TSTS Unable Increased fall risk  6 Minute Walk Test    10 Meter Gait Speed Self-selected: 20.5s = 0.49 m/s; Fastest: 18.0s = 0.56 m/s Below community ambulation speed  (Blank rows = not tested)   TODAY'S TREATMENT    SUBJECTIVE: Pt reports that he is doing alright today. No excessive soreness reported after the last therapy session. He continues to feel like his legs are getting weaker. Ongoing chronic low back pain.   PAIN: Chronic low back pain.   Ther-ex  NuStep (seat 12, hands 13) L0-4 x 10 minutes for BLE strengthening and warm-up during interval history (5 minutes unbilled);  Double black tband resisted gait forward, backward, R lateral, and L lateral x 5 each; Side stepping with 3# ankle weights x multiple lengths; Seated LAQ with 3# ankle weights x 10 BLE; Standing hip strengthening with 5# ankle weights: Hip flexion marches x 10 BLE; HS curls x 10 BLE; Hip abduction x 10 BLE; Hip extension x 10 BLE;  Seated clams with manual resistance x 10 BLE; Seated adductor squeezes with manual resistance x 10 BLE;   PATIENT EDUCATION:  Education details: Pt educated throughout session about proper posture and technique with exercises. Improved exercise technique, movement at target joints, use of target muscles after min to mod verbal, visual, tactile cues.  Person educated: Patient Education method: Explanation, Verbal cues, and Handouts Education comprehension: verbalized understanding and returned demonstration   HOME EXERCISE PROGRAM:  Access Code: [email or web address] URL: [email or web address] Date: 05/31/2023 Prepared by: Crawford Dock  Exercises - Seated Long Arc Quad  - 1 x daily - 3-4 x weekly - 3 sets - 10  reps - 2s hold - Seated Hip Abduction with Resistance  - 1 x daily - 3-4 x weekly - 3 sets - 10 reps - 2s hold - Heel Raises with Counter Support  - 1 x daily - 3-4 x weekly - 3 sets - 10 reps - 2s hold - Mini Squat with Counter Support  -  1 x daily - 3-4 x weekly - 3 sets - 10 reps - Semi-Tandem Balance at [employer] Eyes Open  - 1 x daily - 7 x weekly - 2-3 sets - 10 reps - 10-30 hold   ASSESSMENT:  CLINICAL IMPRESSION: Progressed strengthening exercises during session today with patient. Continued with 3# ankle weights to avoid excessive soreness. No HEP modifications at this time. Pt encouraged to follow-up as scheduled. Will introduce balance exercises at future session as necessary/appropriate. Currently his LE weakness is the largest limitation. He will need updated outcome measures, goals, and a progress note at next session. He will benefit from PT services to address deficits in strength, balance, and mobility in order to return to full function at home and decrease his risk for falls.      OBJECTIVE IMPAIRMENTS: Abnormal gait, decreased balance, difficulty walking, decreased strength, impaired sensation, and pain.   ACTIVITY LIMITATIONS: bending, standing, squatting, stairs, transfers, and caring for others  PARTICIPATION LIMITATIONS: meal prep, cleaning, laundry, shopping, and community activity  PERSONAL FACTORS: Age, Past/current experiences, Time since onset of injury/illness/exacerbation, and 3+ comorbidities: generalized polyneuropathy, COPD, DM, NSTEMI, and spinal stenosis are also affecting patient's functional outcome.   REHAB POTENTIAL: Fair    CLINICAL DECISION MAKING: Unstable/unpredictable  EVALUATION COMPLEXITY: High   GOALS: Goals reviewed with patient? No  SHORT TERM GOALS: Target date: 07/10/2023  Pt will be independent with HEP in order to improve strength and balance in order to decrease fall risk and improve function at home. Baseline:  Goal status:  INITIAL   LONG TERM GOALS: Target date: 08/21/2023  Pt will improve ABC by at least 13% in order to demonstrate clinically significant improvement in balance confidence.  Baseline: 20% Goal status: INITIAL  2.  Pt will improve BERG by at least 3 points in order to demonstrate clinically significant improvement in balance.   Baseline: 40/56; Goal status: INITIAL  3.  Pt will increase self-selected by at least 0.13 m/s in order to demonstrate clinically significant improvement in community ambulation.         Baseline: self-selected: 0.49 m/s Goal status: INITIAL  4. Pt will be able to complete 5TSTS without UE support in order to demonstrate clinically significant improvement in LE strength      Baseline: Unable to perform sit to stand without heavy UE assist Goal status: INITIAL  5. Pt will decrease TUG to below 14 seconds/decrease in order to demonstrate decreased fall risk.  Baseline: 22.2s Goal status: INITIAL  PLAN: PT FREQUENCY: 2x/week  PT DURATION: 12 weeks  PLANNED INTERVENTIONS: Therapeutic exercises, Therapeutic activity, Neuromuscular re-education, Balance training, Gait training, Patient/Family education, Self Care, Joint mobilization, Joint manipulation, Vestibular training, Canalith repositioning, Orthotic/Fit training, DME instructions, Dry Needling, Electrical stimulation, Spinal manipulation, Spinal mobilization, Cryotherapy, Moist heat, Taping, Traction, Ultrasound, Ionotophoresis 4mg /ml Dexamethasone , Manual therapy, and Re-evaluation.  PLAN FOR NEXT SESSION: Update outcome measures/goals, progress note, progress strengthening and introduce balance exercises, modify/progress HEP as needed;    Loie Jahr D Moncerrat Burnstein PT, DPT, GCS  11:45 AM,07/03/23

## 2023-07-01 NOTE — Patient Instructions (Signed)
Be Involved in Caring For Your Health:  Taking Medications When medications are taken as directed, they can greatly improve your health. But if they are not taken as prescribed, they may not work. In some cases, not taking them correctly can be harmful. To help ensure your treatment remains effective and safe, understand your medications and how to take them. Bring your medications to each visit for review by your provider.  Your lab results, notes, and after visit summary will be available on My Chart. We strongly encourage you to use this feature. If lab results are abnormal the clinic will contact you with the appropriate steps. If the clinic does not contact you assume the results are satisfactory. You can always view your results on My Chart. If you have questions regarding your health or results, please contact the clinic during office hours. You can also ask questions on My Chart.  We at [facility] are grateful that you chose [place] to provide your care. We strive to provide evidence-based and compassionate care and are always looking for feedback. If you get a survey from the clinic please complete this so we can hear your opinions.  Diabetes Mellitus and Exercise Regular exercise is important for your health, especially if you have diabetes mellitus. Exercise is not just about losing weight. It can also help you increase muscle strength and bone density and reduce body fat and stress. This can help your level of endurance and make you more fit and flexible. Why should I exercise if I have diabetes? Exercise has many benefits for people with diabetes. It can: Help lower and control your blood sugar (glucose). Help your body respond better and become more sensitive to the hormone insulin. Reduce how much insulin your body needs. Lower your risk for heart disease by: Lowering how much "bad" cholesterol and triglycerides you have in your body. Increasing how much "good" cholesterol  you have in your body. Lowering your blood pressure. Lowering your blood glucose levels. What is my activity plan? Your health care provider or an expert trained in diabetes care (certified diabetes educator) can help you make an activity plan. This plan can help you find the type of exercise that works for you. It may also tell you how often to exercise and for how long. Be sure to: Get at least 150 minutes of medium-intensity or high-intensity exercise each week. This may involve brisk walking, biking, or water aerobics. Do stretching and strengthening exercises at least 2 times a week. This may involve yoga or weight lifting. Spread out your activity over at least 3 days of the week. Get some form of physical activity each day. Do not go more than 2 days in a row without some kind of activity. Avoid being inactive for more than 30 minutes at a time. Take frequent breaks to walk or stretch. Choose activities that you enjoy. Set goals that you know you can accomplish. Start slowly and increase the intensity of your exercise over time. How do I manage my diabetes during exercise?  Monitor your blood glucose Check your blood glucose before and after you exercise. If your blood glucose is 240 mg/dL (40.9 mmol/L) or higher before you exercise, check your urine for ketones. These are chemicals created by the liver. If you have ketones in your urine, do not exercise until your blood glucose returns to normal. If your blood glucose is 100 mg/dL (5.6 mmol/L) or lower, eat a snack that has 15-20 grams of carbohydrate in  it. Check your blood glucose 15 minutes after the snack to make sure that your level is above 100 mg/dL (5.6 mmol/L) before you start to exercise. Your risk for low blood glucose (hypoglycemia) goes up during and after exercise. Know the symptoms of this condition and how to treat it. Follow these instructions at home: Keep a carbohydrate snack on hand for use before, during, and after  exercise. This can help prevent or treat hypoglycemia. Avoid injecting insulin into parts of your body that are going to be used during exercise. This may include: Your arms, when you are going to play tennis. Your legs, when you are about to go jogging. Keep track of your exercise habits. This can help you and your health care provider watch and adjust your activity plan. Write down: What you eat before and after you exercise. Blood glucose levels before and after you exercise. The type and amount of exercise you do. Talk to your health care provider before you start a new activity. They may need to: Make sure that the activity is safe for you. Adjust your insulin, other medicines, and food that you eat. Drink water while you exercise. This can stop you from losing too much water (dehydration). It can also prevent problems caused by having a lot of heat in your body (heat stroke). Where to find more information American Diabetes Association: diabetes.org Association of Diabetes Care & Education Specialists: diabeteseducator.org This information is not intended to replace advice given to you by your health care provider. Make sure you discuss any questions you have with your health care provider. Document Revised: 07/20/2021 Document Reviewed: 07/20/2021 Elsevier Patient Education  2024 [employer].

## 2023-07-03 ENCOUNTER — Ambulatory Visit

## 2023-07-03 DIAGNOSIS — R262 Difficulty in walking, not elsewhere classified: Secondary | ICD-10-CM | POA: Diagnosis not present

## 2023-07-03 DIAGNOSIS — M6281 Muscle weakness (generalized): Secondary | ICD-10-CM | POA: Diagnosis not present

## 2023-07-03 DIAGNOSIS — R2681 Unsteadiness on feet: Secondary | ICD-10-CM | POA: Diagnosis not present

## 2023-07-03 DIAGNOSIS — M5459 Other low back pain: Secondary | ICD-10-CM | POA: Diagnosis not present

## 2023-07-04 ENCOUNTER — Encounter: Payer: Self-pay | Admitting: Nurse Practitioner

## 2023-07-04 ENCOUNTER — Ambulatory Visit (INDEPENDENT_AMBULATORY_CARE_PROVIDER_SITE_OTHER): Payer: Medicare Other | Admitting: Nurse Practitioner

## 2023-07-04 ENCOUNTER — Telehealth (HOSPITAL_COMMUNITY): Payer: Self-pay | Admitting: Emergency Medicine

## 2023-07-04 VITALS — BP 128/66 | HR 80 | Temp 97.9°F | Ht 72.0 in | Wt 183.0 lb

## 2023-07-04 DIAGNOSIS — J449 Chronic obstructive pulmonary disease, unspecified: Secondary | ICD-10-CM | POA: Diagnosis not present

## 2023-07-04 DIAGNOSIS — E1159 Type 2 diabetes mellitus with other circulatory complications: Secondary | ICD-10-CM | POA: Diagnosis not present

## 2023-07-04 DIAGNOSIS — N1831 Chronic kidney disease, stage 3a: Secondary | ICD-10-CM

## 2023-07-04 DIAGNOSIS — E1169 Type 2 diabetes mellitus with other specified complication: Secondary | ICD-10-CM | POA: Diagnosis not present

## 2023-07-04 DIAGNOSIS — D6869 Other thrombophilia: Secondary | ICD-10-CM

## 2023-07-04 DIAGNOSIS — I152 Hypertension secondary to endocrine disorders: Secondary | ICD-10-CM | POA: Diagnosis not present

## 2023-07-04 DIAGNOSIS — E1143 Type 2 diabetes mellitus with diabetic autonomic (poly)neuropathy: Secondary | ICD-10-CM

## 2023-07-04 DIAGNOSIS — I480 Paroxysmal atrial fibrillation: Secondary | ICD-10-CM

## 2023-07-04 DIAGNOSIS — E1129 Type 2 diabetes mellitus with other diabetic kidney complication: Secondary | ICD-10-CM

## 2023-07-04 DIAGNOSIS — E119 Type 2 diabetes mellitus without complications: Secondary | ICD-10-CM | POA: Diagnosis not present

## 2023-07-04 DIAGNOSIS — R809 Proteinuria, unspecified: Secondary | ICD-10-CM | POA: Diagnosis not present

## 2023-07-04 DIAGNOSIS — Z7984 Long term (current) use of oral hypoglycemic drugs: Secondary | ICD-10-CM

## 2023-07-04 DIAGNOSIS — R220 Localized swelling, mass and lump, head: Secondary | ICD-10-CM | POA: Diagnosis not present

## 2023-07-04 DIAGNOSIS — I739 Peripheral vascular disease, unspecified: Secondary | ICD-10-CM | POA: Diagnosis not present

## 2023-07-04 DIAGNOSIS — E785 Hyperlipidemia, unspecified: Secondary | ICD-10-CM | POA: Diagnosis not present

## 2023-07-04 LAB — BAYER DCA HB A1C WAIVED: HB A1C (BAYER DCA - WAIVED): 7.1 % — ABNORMAL HIGH (ref 4.8–5.6)

## 2023-07-04 MED ORDER — ELIQUIS 5 MG PO TABS: 5.0000 mg | ORAL_TABLET | Freq: Two times a day (BID) | ORAL | 3 refills | Status: AC

## 2023-07-04 MED ORDER — AMLODIPINE BESYLATE 5 MG PO TABS: 7.5000 mg | ORAL_TABLET | Freq: Every day | ORAL | 3 refills | Status: DC

## 2023-07-04 MED ORDER — EPINEPHRINE 0.3 MG/0.3ML IJ SOAJ
0.3000 mg | INTRAMUSCULAR | 0 refills | Status: AC | PRN
Start: 2023-07-04 — End: ?

## 2023-07-04 NOTE — Assessment & Plan Note (Signed)
 Chronic, ongoing.  Followed by cardiology at this time.  Continue current medication regimen and collaboration, appreciate their input.  HR with stable rate today.

## 2023-07-04 NOTE — Assessment & Plan Note (Signed)
Chronic, ongoing.  Continue current medication regimen, tolerating 5 MG Crestor well without ADR, and adjust as needed.  Lipid panel today.

## 2023-07-04 NOTE — Assessment & Plan Note (Signed)
 Refer to diabetes with proteinuria plan of care.

## 2023-07-04 NOTE — Assessment & Plan Note (Signed)
 Chronic, stable.  Followed by pulmonary at this time, continue this collaboration.  He is using Anoro without issue.  Reports some improvement in SOB, but continues to have this with heavier exertion.

## 2023-07-04 NOTE — Assessment & Plan Note (Signed)
 Chronic, ongoing with A1c 7.1% today, trend up due to recent daily ice cream cones, and urine ALB 150 (February 2025). Continue current diabetes medication regimen and adjust as needed. Recommend he check BS at least 3 times daily.  Continue Benazepril  for kidney protection. Discussed possible change to Jardiance vs Metformin  for kidney and heart health, but too costly and he does not want to urinate more. Cut back on ice cream. - Vaccinations up to date - ACE and Statin on board - Foot and eye exam up to date.

## 2023-07-04 NOTE — Assessment & Plan Note (Signed)
 Chronic, ongoing with A1c 7.1% today, trend up due to recent daily ice cream cones, and urine ALB 150 (February 2025).  Significant decreased sensation bilateral feet, monitor closely for wounds and falls.  Continue current diabetes medication regimen and adjust as needed. He did not get benefit from Lyrica , Duloxetine, or Gabapentin  in past, will continue collaboration with neurology and pain clinic as needed.  Continue to monitor BS daily and document for visits. LABS: A1c and CMP. Does find benefit from performing physical therapy, but continues to struggle with discomfort. - Statin and ACE on board - Vaccinations up to date - Foot and eye exams up to date

## 2023-07-04 NOTE — Assessment & Plan Note (Signed)
 For years, no known trigger and only inner top lip.  Will send in Epi pen in case needed in future.  He wishes to focus on diet and monitoring at this time .  He will keep journal and trial of medications one at a time if needed.  Send to allergist if ongoing.  Recommend Zyrtec  daily.

## 2023-07-04 NOTE — Assessment & Plan Note (Addendum)
 Chronic, ongoing with A1c 7.1% today, trend up due to recent daily ice cream cones, and urine ALB 150 (February 2025). Continue current diabetes medication regimen and adjust as needed. Recommend he check BS at least 3 times daily.  Continue Benazepril  for kidney protection. Discussed possible change to Jardiance vs Metformin  for kidney and heart health, but too costly and he does not want to urinate more. Cut back on ice cream. Check BP at home at least 3 days a week. - Vaccinations up to date - ACE and Statin on board - Foot and eye exam up to date.

## 2023-07-04 NOTE — Telephone Encounter (Signed)
 Reaching out to patient to offer assistance regarding upcoming cardiac imaging study; pt verbalizes understanding of appt date/time, parking situation and where to check in, pre-test NPO status and medications ordered, and verified current allergies; name and call back number provided for further questions should they arise Rockwell Alexandria RN Navigator Cardiac Imaging [facility] [phone] office [phone] cell

## 2023-07-04 NOTE — Assessment & Plan Note (Signed)
Chronic, stable.  To bilateral lower extremity.  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  Continue ASA daily and monitor closely for any wounds.  Return to vascular as needed. 

## 2023-07-04 NOTE — Assessment & Plan Note (Signed)
Patient on Eliquis with A-fib, monitor CBC regularly.

## 2023-07-04 NOTE — Progress Notes (Signed)
 BP 128/66   Pulse 80   Temp 97.9 F (36.6 C) (Oral)   Ht 6' (1.829 m)   Wt 183 lb (83 kg)   SpO2 98%   BMI 24.82 kg/m    Subjective:    Patient ID: Sean Kid., male    DOB: [birth year], 83 y.o.   MRN: [id number]  HPI: Sean Auxier. is a 83 y.o. male  Chief Complaint  Patient presents with   Atrial Fibrillation   Chronic Kidney Disease   Diabetes   Hyperlipidemia   Hypertension   DIABETES WITH NEUROPATHY A1c in February was 6.7%. Continues on Metformin .  Has been eating more little ice cream cones lately. Hypoglycemic episodes:no Polydipsia/polyuria: no Visual disturbance: no Chest pain: no Paresthesias: no Glucose Monitoring: yes  Accucheck frequency: every other day  Fasting glucose: 130 to 150  Post prandial:  Evening:  Before meals: Taking Insulin ?: no  Long acting insulin :  Short acting insulin : Blood Pressure Monitoring: daily Retinal Examination: Up To Date -- Dr. Murray Arnold in [place] Foot Exam: Up to Date Pneumovax: Up to Date Influenza: Up to Date Aspirin : no    HYPERTENSION / HYPERLIPIDEMIA Takes Amlodipine , Benazepril , and Rosuvastatin . Follows with cardiology and last saw 05/15/23. They recommend SGLT2, although patient concerned with this due to BPH and his frequent urination at baseline. Continues on Benazepril  and Amlodipine . Metoprolol  XL discontinued in past due to symptomatic bradycardia.  Continues CPAP 100% of the time.  Has had top lip swelling at night intermittently for 1-2 years, however now it is swelling more often. Sometimes gets a little pimple inside lip. Takes Flomax , Amlodipine , Prilosec, Metformin  at night.  Is on Benazepril .  No allergy medications at home. Is allergic to shrimp. Swelling goes away without treatment.  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 Is having stress test done tomorrow.  Satisfied with current treatment? yes Duration of hypertension: chronic BP monitoring frequency: every other day BP  range: checks  before medication so sometimes a little higher than 120/70 BP medication side effects: no Duration of hyperlipidemia: chronic Cholesterol medication side effects: no Cholesterol supplements: none Medication compliance: good compliance Aspirin : no Recent stressors: no Recurrent headaches: no Visual changes: no Palpitations: occasion Dyspnea: baseline, uses inhalers Chest pain: no Lower extremity edema: no Dizzy/lightheaded: no  COPD Saw pulmonary last on 08/08/22, is using Anoro.  COPD status: stable Satisfied with current treatment?: yes Oxygen use: no Dyspnea frequency: at baseline Cough frequency: none Rescue inhaler frequency:  none Limitation of activity: no Productive cough: none Last Spirometry: with pulmonary Pneumovax: Up to Date Influenza: Up to Date  ATRIAL FIBRILLATION Atrial fibrillation status: stable Satisfied with current treatment: yes  Medication side effects:  no Medication compliance: good compliance Etiology of atrial fibrillation: unknown Palpitations: as above Chest pain:  no Dyspnea on exertion: as above Orthopnea:  no Syncope:  no Edema:  no Ventricular rate control: B-blocker Anti-coagulation: long acting   CHRONIC KIDNEY DISEASE CKD 3a.  Takes Tamsulosin , Proscar , and Vesicare  per urology, last visit was 04/17/33. CKD status: stable Medications renally dose: yes Previous renal evaluation: no Pneumovax:  Up to Date Influenza Vaccine:  Up to Date   CHRONIC PAIN  Pain stimulator in place. Neuropathy at baseline. Went to PT for period last year which helped his balance, continues to use cane but feels legs continue to get weaker. 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  Last neurology visit on 04/24/23  when they started Duloxetine, which he stopped as not working.  Had tried this in past without benefit.  Also tried Lyrica  and Gabapentin  in past without benefit. No  recent falls. Pain control status: stable Duration: years Location: back and legs Quality: dull, aching, and throbbing Current Pain Level: 3/10 with sitting Previous Pain Level: moderate 7-8/10 Breakthrough pain: no What Activities task can be accomplished with current medication? yes Interested in weaning off narcotics:no   Stool softners/OTC fiber: no  Previous pain specialty evaluation: yes Non-narcotic analgesic meds: no Narcotic contract: no  Relevant past medical, surgical, family and social history reviewed and updated as indicated. Interim medical history since our last visit reviewed. Allergies and medications reviewed and updated.  Review of Systems  Constitutional:  Negative for activity change, appetite change, diaphoresis, fatigue and fever.  Respiratory:  Negative for cough, chest tightness, shortness of breath and wheezing.   Cardiovascular:  Negative for chest pain, palpitations and leg swelling.  Gastrointestinal: Negative.   Endocrine: Negative for cold intolerance, heat intolerance, polydipsia, polyphagia and polyuria.  Neurological: Negative.   Psychiatric/Behavioral: Negative.     Per HPI unless specifically indicated above     Objective:    BP 128/66   Pulse 80   Temp 97.9 F (36.6 C) (Oral)   Ht 6' (1.829 m)   Wt 183 lb (83 kg)   SpO2 98%   BMI 24.82 kg/m   Wt Readings from Last 3 Encounters:  07/04/23 183 lb (83 kg)  05/15/23 178 lb (80.7 kg)  04/18/23 184 lb 12.8 oz (83.8 kg)    Physical Exam Vitals and nursing note reviewed.  Constitutional:      General: He is awake. He is not in acute distress.    Appearance: Normal appearance. He is well-developed and well-groomed. He is not ill-appearing or toxic-appearing.     Comments: Gait stable with his cane support.  HENT:     Head: Normocephalic.     Right Ear: Hearing and external ear normal.     Left Ear: Hearing and external ear normal.     Mouth/Throat:     Lips: Pink. No lesions (no  swelling noted).     Mouth: Mucous membranes are moist.  Eyes:     General: Lids are normal.     Extraocular Movements: Extraocular movements intact.     Conjunctiva/sclera: Conjunctivae normal.  Neck:     Thyroid : No thyromegaly.     Vascular: No carotid bruit.  Cardiovascular:     Rate and Rhythm: Normal rate. Rhythm irregularly irregular.     Heart sounds: Normal heart sounds. No murmur heard.    No gallop.  Pulmonary:     Effort: No accessory muscle usage or respiratory distress.     Breath sounds: Normal breath sounds.  Abdominal:     General: Bowel sounds are normal. There is no distension.     Palpations: Abdomen is soft.     Tenderness: There is no abdominal tenderness.  Musculoskeletal:     Cervical back: Full passive range of motion without pain.     Right lower leg: No edema.     Left lower leg: No edema.  Lymphadenopathy:     Cervical: No cervical adenopathy.  Skin:    General: Skin is warm.     Capillary Refill: Capillary refill takes less than 2 seconds.  Neurological:     Mental Status: He is alert and oriented to person, place, and time.     Deep Tendon  Reflexes: Reflexes are normal and symmetric.     Reflex Scores:      Brachioradialis reflexes are 2+ on the right side and 2+ on the left side.      Patellar reflexes are 2+ on the right side and 2+ on the left side. Psychiatric:        Attention and Perception: Attention normal.        Mood and Affect: Mood normal.        Speech: Speech normal.        Behavior: Behavior normal. Behavior is cooperative.        Thought Content: Thought content normal.    Diabetic Foot Exam - Simple   Simple Foot Form Visual Inspection No deformities, no ulcerations, no other skin breakdown bilaterally: Yes Sensation Testing See comments: Yes Pulse Check Posterior Tibialis and Dorsalis pulse intact bilaterally: Yes Comments Sensation right 5/10 and left 5/10     Results for orders placed or performed in visit on  07/04/23  Bayer DCA Hb A1c Waived   Collection Time: 07/04/23  1:34 PM  Result Value Ref Range   HB A1C (BAYER DCA - WAIVED) 7.1 (H) 4.8 - 5.6 %   *Note: Due to a large number of results and/or encounters for the requested time period, some results have not been displayed. A complete set of results can be found in Results Review.      Assessment & Plan:   Problem List Items Addressed This Visit       Cardiovascular and Mediastinum   Peripheral vascular disease (HCC)   Chronic, stable.  To bilateral lower extremity.  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  Continue ASA daily and monitor closely for any wounds.  Return to vascular as needed.      Relevant Medications   EPINEPHrine  0.3 mg/0.3 mL IJ SOAJ injection   amLODipine  (NORVASC ) 5 MG tablet   ELIQUIS  5 MG TABS tablet   Hypertension associated with diabetes (HCC)   Chronic, ongoing with A1c 7.1% today, trend up due to recent daily ice cream cones, and urine ALB 150 (February 2025). Continue current diabetes medication regimen and adjust as needed. Recommend he check BS at least 3 times daily.  Continue Benazepril  for kidney protection. Discussed possible change to Jardiance vs Metformin  for kidney and heart health, but too costly and he does not want to urinate more. Cut back on ice cream. Check BP at home at least 3 days a week. - Vaccinations up to date - ACE and Statin on board - Foot and eye exam up to date.      Relevant Medications   EPINEPHrine  0.3 mg/0.3 mL IJ SOAJ injection   amLODipine  (NORVASC ) 5 MG tablet   ELIQUIS  5 MG TABS tablet   Other Relevant Orders   Bayer DCA Hb A1c Waived (Completed)   AF (paroxysmal atrial fibrillation) (HCC)   Chronic, ongoing.  Followed by cardiology at this time.  Continue current medication regimen and collaboration, appreciate their input.  HR with stable rate today.      Relevant Medications   EPINEPHrine  0.3 mg/0.3 mL IJ SOAJ injection   amLODipine  (NORVASC ) 5 MG tablet   ELIQUIS  5 MG TABS  tablet     Respiratory   COPD (chronic obstructive pulmonary disease) (HCC)   Chronic, stable.  Followed by pulmonary at this time, continue this collaboration.  He is using Anoro without issue.  Reports some improvement in SOB, but continues to have this with heavier exertion.  Endocrine   Hyperlipidemia associated with type 2 diabetes mellitus (HCC)   Chronic, ongoing.  Continue current medication regimen, tolerating 5 MG Crestor  well without ADR, and adjust as needed.  Lipid panel today.      Relevant Medications   EPINEPHrine  0.3 mg/0.3 mL IJ SOAJ injection   amLODipine  (NORVASC ) 5 MG tablet   ELIQUIS  5 MG TABS tablet   Other Relevant Orders   Bayer DCA Hb A1c Waived (Completed)   Comprehensive metabolic panel with GFR   Lipid Panel w/o Chol/HDL Ratio   Diabetes mellitus with proteinuria (HCC) - Primary   Chronic, ongoing with A1c 7.1% today, trend up due to recent daily ice cream cones, and urine ALB 150 (February 2025). Continue current diabetes medication regimen and adjust as needed. Recommend he check BS at least 3 times daily.  Continue Benazepril  for kidney protection. Discussed possible change to Jardiance vs Metformin  for kidney and heart health, but too costly and he does not want to urinate more. Cut back on ice cream. - Vaccinations up to date - ACE and Statin on board - Foot and eye exam up to date.      Relevant Orders   Bayer DCA Hb A1c Waived (Completed)   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HCC)   Chronic, ongoing with A1c 7.1% today, trend up due to recent daily ice cream cones, and urine ALB 150 (February 2025).  Significant decreased sensation bilateral feet, monitor closely for wounds and falls.  Continue current diabetes medication regimen and adjust as needed. He did not get benefit from Lyrica , Duloxetine, or Gabapentin  in past, will continue collaboration with neurology and pain clinic as needed.  Continue to monitor BS daily and document for  visits. LABS: A1c and CMP. Does find benefit from performing physical therapy, but continues to struggle with discomfort. - Statin and ACE on board - Vaccinations up to date - Foot and eye exams up to date      Relevant Orders   Bayer DCA Hb A1c Waived (Completed)   Diabetes mellitus treated with oral medication (HCC)   Refer to diabetes with proteinuria plan of  care.      Relevant Orders   Bayer DCA Hb A1c Waived (Completed)     Genitourinary   CKD (chronic kidney disease) stage 3, GFR 30-59 ml/min (HCC)   Chronic, ongoing CKD 3a.  Continue to monitor closely and refer to nephrology if decline.  Continue Benazepril  for kidney protection and proteinuria.  Renal dose medications as needed based on labs.  Would benefit from SGLT2, but refuses due to concern for increased urination which he already struggles with.      Relevant Orders   Comprehensive metabolic panel with GFR     Hematopoietic and Hemostatic   Acquired thrombophilia (HCC)   Patient on Eliquis  with A-fib, monitor CBC regularly.        Other   Lip swelling   For years, no known trigger and only inner top lip.  Will send in Epi pen in case needed in future.  He wishes to focus on diet and monitoring at this time .  He will keep journal and trial of medications one at a time if needed.  Send to allergist if ongoing.  Recommend Zyrtec  daily.        Follow up plan: Return in about 3 months (around 10/04/2023) for T2DM, HTN/HLD, A FIB, CHRONIC PAIN.

## 2023-07-04 NOTE — Assessment & Plan Note (Signed)
 Chronic, ongoing CKD 3a.  Continue to monitor closely and refer to nephrology if decline.  Continue Benazepril for kidney protection and proteinuria.  Renal dose medications as needed based on labs.  Would benefit from SGLT2, but refuses due to concern for increased urination which he already struggles with.

## 2023-07-05 ENCOUNTER — Ambulatory Visit: Payer: Self-pay | Admitting: Nurse Practitioner

## 2023-07-05 ENCOUNTER — Encounter

## 2023-07-05 ENCOUNTER — Ambulatory Visit
Admission: RE | Admit: 2023-07-05 | Discharge: 2023-07-05 | Disposition: A | Discharge status: Home / Self Care | Source: Ambulatory Visit | Attending: Cardiology | Admitting: Cardiology

## 2023-07-05 DIAGNOSIS — Q263 Partial anomalous pulmonary venous connection: Secondary | ICD-10-CM | POA: Insufficient documentation

## 2023-07-05 DIAGNOSIS — Z9682 Presence of neurostimulator: Secondary | ICD-10-CM | POA: Diagnosis not present

## 2023-07-05 DIAGNOSIS — I480 Paroxysmal atrial fibrillation: Secondary | ICD-10-CM | POA: Diagnosis not present

## 2023-07-05 DIAGNOSIS — I700 Atherosclerosis of aorta: Secondary | ICD-10-CM | POA: Insufficient documentation

## 2023-07-05 DIAGNOSIS — M47814 Spondylosis without myelopathy or radiculopathy, thoracic region: Secondary | ICD-10-CM | POA: Diagnosis not present

## 2023-07-05 DIAGNOSIS — I2510 Atherosclerotic heart disease of native coronary artery without angina pectoris: Secondary | ICD-10-CM

## 2023-07-05 LAB — COMPREHENSIVE METABOLIC PANEL WITH GFR
ALT: 15 IU/L (ref 0–44)
AST: 15 IU/L (ref 0–40)
Albumin: 4 g/dL (ref 3.8–4.8)
Alkaline Phosphatase: 73 IU/L (ref 44–121)
BUN/Creatinine Ratio: 16 (ref 10–24)
BUN: 21 mg/dL (ref 8–27)
Bilirubin Total: 0.4 mg/dL (ref 0.0–1.2)
CO2: 23 mmol/L (ref 20–29)
Calcium: 9.4 mg/dL (ref 8.6–10.2)
Chloride: 98 mmol/L (ref 96–106)
Creatinine, Ser: 1.32 mg/dL — ABNORMAL HIGH (ref 0.76–1.27)
Globulin, Total: 2.4 g/dL (ref 1.5–4.5)
Glucose: 242 mg/dL — ABNORMAL HIGH (ref 70–99)
Potassium: 4.6 mmol/L (ref 3.5–5.2)
Sodium: 135 mmol/L (ref 134–144)
Total Protein: 6.4 g/dL (ref 6.0–8.5)
eGFR: 55 mL/min/{1.73_m2} — ABNORMAL LOW (ref >59)

## 2023-07-05 LAB — LIPID PANEL W/O CHOL/HDL RATIO
Cholesterol, Total: 87 mg/dL — ABNORMAL LOW (ref 100–199)
HDL: 40 mg/dL (ref >39)
LDL Chol Calc (NIH): 30 mg/dL (ref 0–99)
Triglycerides: 80 mg/dL (ref 0–149)
VLDL Cholesterol Cal: 17 mg/dL (ref 5–40)

## 2023-07-05 MED ORDER — RUBIDIUM RB82 GENERATOR (RUBYFILL)
25.0000 | PACK | Freq: Once | INTRAVENOUS | Status: AC
Start: 2023-07-05 — End: 2023-07-05
  Administered 2023-07-05: 20.72 via INTRAVENOUS

## 2023-07-05 MED ORDER — REGADENOSON 0.4 MG/5ML IV SOLN
INTRAVENOUS | Status: AC
Start: 2023-07-05 — End: ?
  Filled 2023-07-05: qty 5

## 2023-07-05 MED ORDER — REGADENOSON 0.4 MG/5ML IV SOLN
0.4000 mg | Freq: Once | INTRAVENOUS | Status: AC
  Administered 2023-07-05: 0.4 mg via INTRAVENOUS
  Filled 2023-07-05: qty 5

## 2023-07-05 MED ORDER — RUBIDIUM RB82 GENERATOR (RUBYFILL)
25.0000 | PACK | Freq: Once | INTRAVENOUS | Status: AC
  Administered 2023-07-05: 20.94 via INTRAVENOUS

## 2023-07-05 NOTE — Progress Notes (Signed)
 Contacted via MyChart   Good day Sean Reyes, your labs have returned: - Kidney function, creatinine and eGFR, shows some mild decline this check which we can continue to monitor at visits. - Lipid panel looks great.  Any questions? Keep being stellar!!  Thank you for allowing me to participate in your care.  I appreciate you. Kindest regards, Kaleisha Bhargava

## 2023-07-06 ENCOUNTER — Telehealth: Payer: Self-pay | Admitting: Nurse Practitioner

## 2023-07-06 NOTE — Telephone Encounter (Signed)
 Copied from CRM [id number]. Topic: General - Other >> Jul 06, 2023  1:46 PM [employer] wrote: Reason for CRM: Sean Reyes from [facility] Received all of paperwork and it is worded perfectly but needs to be typed in medically necessity format. Question and answer format is not accepted by insurance  Callback #: [phone] Fax #: [phone]

## 2023-07-07 LAB — NM PET CT CARDIAC PERFUSION MULTI W/ABSOLUTE BLOODFLOW
MBFR: 1.75
Nuc Rest EF: 62 %
Nuc Stress EF: 69 %
Peak HR: 86 {beats}/min
Rest HR: 74 {beats}/min
Rest MBF: 1.2 ml/g/min
Rest Nuclear Isotope Dose: 20.9 mCi
SRS: 0
SSS: 0
ST Depression (mm): 0 mm
Stress MBF: 2.1 ml/g/min
Stress Nuclear Isotope Dose: 20.7 mCi
TID: 1.06

## 2023-07-09 NOTE — Therapy (Signed)
 OUTPATIENT PHYSICAL THERAPY BALANCE TREATMENT/PROGRESS NOTE  Dates of reporting period  05/29/23   to   07/10/23    Patient Name: Sean Reyes. MRN: [id number] DOB:May 04, 1940, 83 y.o., male Today's Date: 07/11/2023  END OF SESSION:  PT End of Session - 07/10/23 1311     Visit Number 10    Number of Visits 25    Date for PT Re-Evaluation 08/21/23    Authorization Type eval: 05/29/23;    Authorization Time Period Medicare A&B 2025  VL: Based on MN  No auth req    PT Start Time 1315    PT Stop Time 1400    PT Time Calculation (min) 45 min    Equipment Utilized During Treatment Gait belt    Activity Tolerance Patient tolerated treatment well;Patient limited by pain    Behavior During Therapy WFL for tasks assessed/performed            Past Medical History:  Diagnosis Date   Anemia    Anxiety    Arthritis    Arthritis of neck    Atrial fibrillation (HCC)    Cataracts, bilateral    Complication of anesthesia    pt reports low BP's after surgery at [facility] and difficulty awakening   Depression    Diabetes (HCC)    dx 6-8 yrs ago   Dysrhythmia    a-fib   GERD (gastroesophageal reflux disease)    OCC TAKES ALKA SELTZER   History of kidney stones    10-15 yrs ago   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   Hyperlipidemia    Hypertension    Myocardial infarction (HCC) 12/2020   Nocturia    S/P ablation of atrial fibrillation    Ablative therapy   Sleep apnea    CPAP    Spinal stenosis    Tachycardia, unspecified    Past Surgical History:  Procedure Laterality Date   ABLATION     ANTERIOR LAT LUMBAR FUSION N/A 06/27/2017   Procedure: Anterior Lateral Lumbar Interbody  Fusion - Lumbar Two-Lumbar Three - Lumbar Three-Lumbar Four, Posterior Lumbar Interbody Fusion Lumbar Four- Five;  Surgeon: Gearl Keens, MD;  Location: [facility] OR;  Service: Neurosurgery;  Laterality: N/A;  Anterior Lateral Lumbar Interbody  Fusion - Lumbar Two-Lumbar Three - Lumbar  Three-Lumbar Four, Posterior Lumbar Interbody Fusion Lumbar Four- Five   BACK SURGERY     CARDIAC CATHETERIZATION     CARDIOVERSION N/A 08/29/2018   Procedure: CARDIOVERSION;  Surgeon: Michelle Aid, MD;  Location: [facility] ORS;  Service: Cardiovascular;  Laterality: N/A;   CARDIOVERSION N/A 09/24/2018   Procedure: CARDIOVERSION;  Surgeon: Michelle Aid, MD;  Location: [facility] ORS;  Service: Cardiovascular;  Laterality: N/A;   COLONOSCOPY WITH PROPOFOL  N/A 10/05/2015   Procedure: COLONOSCOPY WITH PROPOFOL ;  Surgeon: Deveron Fly, MD;  Location: [facility] ENDOSCOPY;  Service: Endoscopy;  Laterality: N/A;   COLONOSCOPY WITH PROPOFOL  N/A 11/01/2020   Procedure: COLONOSCOPY WITH PROPOFOL ;  Surgeon: Luke Salaam, MD;  Location: [facility] ENDOSCOPY;  Service: Gastroenterology;  Laterality: N/A;   CORONARY STENT INTERVENTION N/A 12/22/2020   Procedure: CORONARY STENT INTERVENTION;  Surgeon: Percival Brace, MD;  Location: [facility] INVASIVE CV LAB;  Service: Cardiovascular;  Laterality: N/A;   ESOPHAGOGASTRODUODENOSCOPY N/A 11/01/2020   Procedure: ESOPHAGOGASTRODUODENOSCOPY (EGD);  Surgeon: Luke Salaam, MD;  Location: [facility] ENDOSCOPY;  Service: Gastroenterology;  Laterality: N/A;   ESOPHAGOGASTRODUODENOSCOPY (EGD) WITH PROPOFOL  N/A 04/01/2018   Procedure: ESOPHAGOGASTRODUODENOSCOPY (EGD) WITH PROPOFOL ;  Surgeon: Aneita Baptise  U, MD;  Location: [facility] ENDOSCOPY;  Service: Endoscopy;  Laterality: N/A;   ESOPHAGOGASTRODUODENOSCOPY (EGD) WITH PROPOFOL  N/A 06/01/2022   Procedure: ESOPHAGOGASTRODUODENOSCOPY (EGD) WITH PROPOFOL ;  Surgeon: Luke Salaam, MD;  Location: [facility] ENDOSCOPY;  Service: Gastroenterology;  Laterality: N/A;   EYE SURGERY     HERNIA REPAIR     JOINT REPLACEMENT Bilateral    hips  RT+  LEFT X2    LEFT HEART CATH AND CORONARY ANGIOGRAPHY N/A 12/22/2020   Procedure: LEFT HEART CATH AND CORONARY ANGIOGRAPHY;  Surgeon: Percival Brace, MD;  Location: [facility] INVASIVE CV LAB;  Service:  Cardiovascular;  Laterality: N/A;   LEFT HEART CATH AND CORONARY ANGIOGRAPHY Left 08/23/2021   Procedure: LEFT HEART CATH AND CORONARY ANGIOGRAPHY;  Surgeon: Michelle Aid, MD;  Location: [facility] INVASIVE CV LAB;  Service: Cardiovascular;  Laterality: Left;   LUMBAR LAMINECTOMY/DECOMPRESSION MICRODISCECTOMY Left 09/13/2016   Procedure: Microdiscectomy - Lumbar two-three,  Lumbar three- - left;  Surgeon: Gearl Keens, MD;  Location: [facility] OR;  Service: Neurosurgery;  Laterality: Left;   SPINAL CORD STIMULATOR INSERTION  07/08/2019   TONSILLECTOMY     Patient Active Problem List   Diagnosis Date Noted   Lip swelling 07/04/2023   Diabetes mellitus treated with oral medication (HCC) 04/05/2023   Subareolar mass of left breast 10/03/2022   COPD (chronic obstructive pulmonary disease) (HCC) 10/31/2021   Shortness of breath on exertion 08/04/2021   Diabetes mellitus with proteinuria (HCC) 04/04/2021   GERD without esophagitis 04/02/2021   Coronary artery disease 12/28/2020   History of non-ST elevation myocardial infarction (NSTEMI) 12/21/2020   Pain in right shin 10/25/2020   Peripheral vascular disease (HCC) 08/06/2020   Chronic pain syndrome 07/06/2020   Cervical facet joint syndrome 04/08/2020   Spinal cord stimulator status 12/11/2019   Elevated TSH 08/20/2019   CKD (chronic kidney disease) stage 3, GFR 30-59 ml/min (HCC) 01/19/2019   Acquired thrombophilia (HCC) 01/19/2019   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   Postlaminectomy syndrome, lumbar region 01/16/2019   History of fusion of lumbar spine (L2-L5) 01/16/2019   Chronic radicular lumbar pain 01/16/2019   HNP (herniated nucleus pulposus), lumbar 04/29/2018   Advanced care planning/counseling discussion 09/28/2016   Spinal stenosis, lumbar region, 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/13/2016   Hyperlipidemia associated with type 2 diabetes mellitus (HCC) 07/14/2015   Symptomatic anemia 06/30/2015   Benign prostatic hyperplasia  without lower urinary tract symptoms 06/02/2015   OSA (obstructive sleep apnea) 03/23/2015   Hypertension associated with diabetes (HCC) 09/28/2014   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HCC) 09/28/2014   H/O prior ablation treatment 10/19/2011   AF (paroxysmal atrial fibrillation) (HCC) 10/19/2011   PCP: Lemar Pyles, NP  REFERRING PROVIDER: Rosan Comfort, MD   REFERRING DIAG: Peripheral Neuropathy  RATIONALE FOR EVALUATION AND TREATMENT: Rehabilitation  THERAPY DIAG: Difficulty in walking, not elsewhere classified  Muscle weakness (generalized)  ONSET DATE: 01/25/23 (acute on chronic)  FOLLOW-UP APPT SCHEDULED WITH REFERRING PROVIDER: Yes   FROM INITIAL EVALUATION SUBJECTIVE:  SUBJECTIVE STATEMENT:  Imbalance and LE weakness;  PERTINENT HISTORY:  Lower extremity weakness in patient with 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 He reports weakness in both legs and numbness in toes post surgery in 2018. He experienced a fall on 01/25/2023 at home, resulting in a L hip injury. No residual pain in hip at this time. No additional falls since December. He reports progressive LE weakness impairing his functional ability at home. He is known to this clinic from prior episodes of care for similar concerns.  Prior history: 11/02/22 Patient reports to physical therapy today with a chief concern of muscle weakness and imbalance. Patient reports progressive worsening in balance and muscular  strength. He states that he has loss of sensation in his toes he reports they are "asleep". Patient reports pain in the anterior thigh and bilateral calves. He currently uses an loftstand crutch in his RUE. Lately patient has reported difficulty with dressing LE, performing fine motor movements with left hand. He denies falls, saddle parasthesia, nausea, vomitting, night sweats.    10/17/2022 Patient reported he is not doing well lately. He has been seeing Dr. Mont Antis. He reports his legs are so weak that he can hardly walk, so he has been using a crutch for the last 1-2 years. Reported numbness and loss of sensation in his toes. He also reported that recently his left hand has began to has some numbness to where it feels like it is asleep. Denies pain, loss of sensation, coldness, burning. Denies falls."  Imaging: NCS conducted on 11/12/2018: Abnormal study. 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 There is also preliminary evidence of a superimposed left lower lumbosacral polyradiculopathy, based on NCV and distal needle exam results. I cannot test lumbar paraspinals due to patient on Eliquis .    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 Yes. 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  Recent memory is intact.  Remote memory is intact.  Attention span and concentration are intact.  Expressive speech is intact.  Patient's fund of knowledge is  within normal limits for educational level.    Gross Musculoskeletal Assessment Tremor: None Bulk: Normal Tone: Normal  Posture: Forward head and rounded shoulders  AROM Deferred specific measurements. Functional motion intact;  LE MMT: MMT (out of 5) Right  Left   Hip flexion 4 4+  Hip extension    Hip abduction (seated) 3+ 3+  Hip adduction (seated 3+ 3+  Hip internal rotation    Hip external rotation    Knee flexion (seated) 4 4  Knee extension 4 4  Ankle dorsiflexion 4 4  Ankle plantarflexion    Ankle inversion    Ankle eversion    (* = pain; Blank rows = not tested)  Transfers: Assistive device utilized: Single Lofstrand RUE  Sit to stand: Modified independence Stand to sit: Modified independence Chair to chair: Modified independence Floor: Deferred  Stairs: Level of Assistance: CGA Stair Negotiation Technique: Alternating Pattern  with Bilateral Rails Number of Stairs: 4  Height of Stairs: 6"  Comments: Slow and labored ascending/descending. No overt LOB;  Gait: Gait pattern: decreased step length- Right, decreased step length- Left, trunk flexed, poor foot clearance- Right, and poor foot clearance-  Left Distance walked: 150' over the course of evaluation Assistive device utilized: Single Lofstrand in RUE Level of assistance: Modified independence Comments: Slow and labored ambulation. Decreased self selected speed  Functional Outcome Measures  Results Comments  BERG 40/56 Increased fall risk  DGI    FGA    TUG 22.2 seconds Increased fall risk  5TSTS Unable Increased fall risk  6 Minute Walk Test    10 Meter Gait Speed Self-selected: 20.5s = 0.49 m/s; Fastest: 18.0s = 0.56 m/s Below community ambulation speed  (Blank rows = not tested)   TODAY'S TREATMENT    SUBJECTIVE: Pt reports that he is doing alright today. No excessive soreness reported after the last therapy session. He continues to feel like his legs are getting weaker. Ongoing chronic low back pain.   PAIN: Chronic low back pain.   Therapeutic Activity NuStep (seat 12, hands 13) L0-4 x 10 minutes for BLE strengthening and warm-up during interval history (5 minutes unbilled);  Updated outcome measures with patient: ABC: 30% BERG: 50/56 59m Gait Speed: 19.1s = 0.52 m/s 5TSTS: 11.4s TUG: 14.0s;  Standing mini squats x 15; Standing heel raises x 15;   PATIENT EDUCATION:  Education details: Pt educated throughout session about proper posture and technique with exercises. Improved exercise technique, movement at target joints, use of target muscles after min to mod verbal, visual, tactile cues.  Person educated: Patient Education method: Explanation, Verbal cues, and Handouts Education comprehension: verbalized understanding and returned demonstration   HOME EXERCISE PROGRAM:  Access Code: [email or web address] URL: [email or web address] Date: 05/31/2023 Prepared by: Crawford Dock  Exercises - Seated Long Arc Quad  - 1 x daily - 3-4 x weekly - 3 sets - 10 reps - 2s hold - Seated Hip Abduction with Resistance  - 1 x daily - 3-4 x weekly - 3 sets - 10 reps - 2s hold - Heel Raises with Counter Support  -  1 x daily - 3-4 x weekly - 3 sets - 10 reps - 2s hold - Mini Squat with Counter Support  - 1 x daily - 3-4 x weekly - 3 sets - 10 reps - Semi-Tandem Balance at [employer] Eyes Open  - 1 x daily - 7 x weekly - 2-3 sets - 10 reps - 10-30 hold   ASSESSMENT:  CLINICAL IMPRESSION: Updated outcome  measures/goals with patient today. He has made significant progress since his initial evaluation. His BERG balance score improved from 40/56 at the initial evaluation to 50/56 today. His balance confidence increased from 20% initially to 30% today however he still has considerable room for improvement. He also demonstrated significant improvement in his BLE strength with large improvements in both his 5TSTS and his TUG scores. His 50m gait speed improved but is still below the required speed for community ambulation. No HEP modifications at this time. Pt encouraged to follow-up as scheduled. Will introduce balance exercises at future session as necessary/appropriate. He will benefit from PT services to address deficits in strength, balance, and mobility in order to return to full function at home and decrease his risk for falls.      OBJECTIVE IMPAIRMENTS: Abnormal gait, decreased balance, difficulty walking, decreased strength, impaired sensation, and pain.   ACTIVITY LIMITATIONS: bending, standing, squatting, stairs, transfers, and caring for others  PARTICIPATION LIMITATIONS: meal prep, cleaning, laundry, shopping, and community activity  PERSONAL FACTORS: Age, Past/current experiences, Time since onset of injury/illness/exacerbation, and 3+ comorbidities: generalized polyneuropathy, COPD, DM, NSTEMI, and spinal stenosis are also affecting patient's functional outcome.   REHAB POTENTIAL: Fair    CLINICAL DECISION MAKING: Unstable/unpredictable  EVALUATION COMPLEXITY: High   GOALS: Goals reviewed with patient? No  SHORT TERM GOALS: Target date: 07/10/2023  Pt will be independent with HEP in order  to improve strength and balance in order to decrease fall risk and improve function at home. Baseline:  Goal status: ONGOING   LONG TERM GOALS: Target date: 08/21/2023  Pt will improve ABC by at least 13% in order to demonstrate clinically significant improvement in balance confidence.  Baseline: 20%; 07/10/23: 30% Goal status: PARTIALLY MET  2.  Pt will improve BERG by at least 3 points in order to demonstrate clinically significant improvement in balance.   Baseline: 40/56; 07/10/23: 50/56 Goal status: ACHIEVED  3.  Pt will increase self-selected to at least 0.6 m/s in order to demonstrate clinically significant improvement in limited community ambulation.         Baseline: self-selected: 0.49 m/s; 07/10/23: 0.52 m/s Goal status: REVISED  4. Pt will be able to complete 5TSTS without UE support in order to demonstrate clinically significant improvement in LE strength      Baseline: Unable to perform sit to stand without heavy UE assist; 07/10/23: 11.4s Goal status: ACHIEVED  5. Pt will decrease TUG to below 14 seconds/decrease in order to demonstrate decreased fall risk.  Baseline: 22.2s; 07/10/23: 14.0s;  Goal status: PARTIALLY MET  PLAN: PT FREQUENCY: 2x/week  PT DURATION: 12 weeks  PLANNED INTERVENTIONS: Therapeutic exercises, Therapeutic activity, Neuromuscular re-education, Balance training, Gait training, Patient/Family education, Self Care, Joint mobilization, Joint manipulation, Vestibular training, Canalith repositioning, Orthotic/Fit training, DME instructions, Dry Needling, Electrical stimulation, Spinal manipulation, Spinal mobilization, Cryotherapy, Moist heat, Taping, Traction, Ultrasound, Ionotophoresis 4mg /ml Dexamethasone , Manual therapy, and Re-evaluation.  PLAN FOR NEXT SESSION: progress strengthening and introduce balance exercises, modify/progress HEP as needed;    Mina Carlisi D Alexavier Tsutsui PT, DPT, GCS  12:10 PM,07/11/23

## 2023-07-10 ENCOUNTER — Ambulatory Visit: Payer: Self-pay | Admitting: Cardiology

## 2023-07-10 ENCOUNTER — Ambulatory Visit

## 2023-07-10 ENCOUNTER — Telehealth: Payer: Self-pay

## 2023-07-10 DIAGNOSIS — R262 Difficulty in walking, not elsewhere classified: Secondary | ICD-10-CM | POA: Diagnosis not present

## 2023-07-10 DIAGNOSIS — R2681 Unsteadiness on feet: Secondary | ICD-10-CM | POA: Diagnosis not present

## 2023-07-10 DIAGNOSIS — M6281 Muscle weakness (generalized): Secondary | ICD-10-CM

## 2023-07-10 DIAGNOSIS — M5459 Other low back pain: Secondary | ICD-10-CM | POA: Diagnosis not present

## 2023-07-10 NOTE — Telephone Encounter (Unsigned)
 Copied from CRM [id number]. Topic: General - Call Back - No Documentation >> Jul 10, 2023  2:39 PM Zipporah Him wrote: Reason for CRM: [facility] calling in regard to this patient, returning a call Sean Reyes. Please return call when available, callback number [phone]

## 2023-07-10 NOTE — Progress Notes (Signed)
 There was no evidence of ischemia and no evidence of infarct.  LV perfusion was normal.  Study is considered low risk.

## 2023-07-10 NOTE — Telephone Encounter (Signed)
 Returned call to [employer]. LVM notifying this company that we have not received nor returned any paperwork for this patient that we are aware of. Asked for them to please return my call to discuss further.

## 2023-07-11 NOTE — Therapy (Signed)
 OUTPATIENT PHYSICAL THERAPY BALANCE TREATMENT/PROGRESS NOTE  Dates of reporting period  05/29/23   to   07/10/23    Patient Name: Sean Reyes. MRN: [id number] DOB:03-17-1940, 83 y.o., male Today's Date: 07/11/2023  END OF SESSION:   Past Medical History:  Diagnosis Date   Anemia    Anxiety    Arthritis    Arthritis of neck    Atrial fibrillation (HCC)    Cataracts, bilateral    Complication of anesthesia    pt reports low BP's after surgery at [facility] and difficulty awakening   Depression    Diabetes (HCC)    dx 6-8 yrs ago   Dysrhythmia    a-fib   GERD (gastroesophageal reflux disease)    OCC TAKES ALKA SELTZER   History of kidney stones    10-15 yrs ago   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   Hyperlipidemia    Hypertension    Myocardial infarction (HCC) 12/2020   Nocturia    S/P ablation of atrial fibrillation    Ablative therapy   Sleep apnea    CPAP    Spinal stenosis    Tachycardia, unspecified    Past Surgical History:  Procedure Laterality Date   ABLATION     ANTERIOR LAT LUMBAR FUSION N/A 06/27/2017   Procedure: Anterior Lateral Lumbar Interbody  Fusion - Lumbar Two-Lumbar Three - Lumbar Three-Lumbar Four, Posterior Lumbar Interbody Fusion Lumbar Four- Five;  Surgeon: Gearl Keens, MD;  Location: [facility] OR;  Service: Neurosurgery;  Laterality: N/A;  Anterior Lateral Lumbar Interbody  Fusion - Lumbar Two-Lumbar Three - Lumbar Three-Lumbar Four, Posterior Lumbar Interbody Fusion Lumbar Four- Five   BACK SURGERY     CARDIAC CATHETERIZATION     CARDIOVERSION N/A 08/29/2018   Procedure: CARDIOVERSION;  Surgeon: Michelle Aid, MD;  Location: [facility] ORS;  Service: Cardiovascular;  Laterality: N/A;   CARDIOVERSION N/A 09/24/2018   Procedure: CARDIOVERSION;  Surgeon: Michelle Aid, MD;  Location: [facility] ORS;  Service: Cardiovascular;  Laterality: N/A;   COLONOSCOPY WITH PROPOFOL  N/A 10/05/2015   Procedure: COLONOSCOPY WITH PROPOFOL ;  Surgeon:  Deveron Fly, MD;  Location: [facility] ENDOSCOPY;  Service: Endoscopy;  Laterality: N/A;   COLONOSCOPY WITH PROPOFOL  N/A 11/01/2020   Procedure: COLONOSCOPY WITH PROPOFOL ;  Surgeon: Luke Salaam, MD;  Location: [facility] ENDOSCOPY;  Service: Gastroenterology;  Laterality: N/A;   CORONARY STENT INTERVENTION N/A 12/22/2020   Procedure: CORONARY STENT INTERVENTION;  Surgeon: Percival Brace, MD;  Location: [facility] INVASIVE CV LAB;  Service: Cardiovascular;  Laterality: N/A;   ESOPHAGOGASTRODUODENOSCOPY N/A 11/01/2020   Procedure: ESOPHAGOGASTRODUODENOSCOPY (EGD);  Surgeon: Luke Salaam, MD;  Location: [facility] ENDOSCOPY;  Service: Gastroenterology;  Laterality: N/A;   ESOPHAGOGASTRODUODENOSCOPY (EGD) WITH PROPOFOL  N/A 04/01/2018   Procedure: ESOPHAGOGASTRODUODENOSCOPY (EGD) WITH PROPOFOL ;  Surgeon: Deveron Fly, MD;  Location: [facility] ENDOSCOPY;  Service: Endoscopy;  Laterality: N/A;   ESOPHAGOGASTRODUODENOSCOPY (EGD) WITH PROPOFOL  N/A 06/01/2022   Procedure: ESOPHAGOGASTRODUODENOSCOPY (EGD) WITH PROPOFOL ;  Surgeon: Luke Salaam, MD;  Location: [facility] ENDOSCOPY;  Service: Gastroenterology;  Laterality: N/A;   EYE SURGERY     HERNIA REPAIR     JOINT REPLACEMENT Bilateral    hips  RT+  LEFT X2    LEFT HEART CATH AND CORONARY ANGIOGRAPHY N/A 12/22/2020   Procedure: LEFT HEART CATH AND CORONARY ANGIOGRAPHY;  Surgeon: Percival Brace, MD;  Location: [facility] INVASIVE CV LAB;  Service: Cardiovascular;  Laterality: N/A;   LEFT HEART CATH AND CORONARY ANGIOGRAPHY Left 08/23/2021   Procedure:  LEFT HEART CATH AND CORONARY ANGIOGRAPHY;  Surgeon: Michelle Aid, MD;  Location: [facility] INVASIVE CV LAB;  Service: Cardiovascular;  Laterality: Left;   LUMBAR LAMINECTOMY/DECOMPRESSION MICRODISCECTOMY Left 09/13/2016   Procedure: Microdiscectomy - Lumbar two-three,  Lumbar three- - left;  Surgeon: Gearl Keens, MD;  Location: [facility] OR;  Service: Neurosurgery;  Laterality: Left;   SPINAL CORD STIMULATOR INSERTION  07/08/2019    TONSILLECTOMY     Patient Active Problem List   Diagnosis Date Noted   Lip swelling 07/04/2023   Diabetes mellitus treated with oral medication (HCC) 04/05/2023   Subareolar mass of left breast 10/03/2022   COPD (chronic obstructive pulmonary disease) (HCC) 10/31/2021   Shortness of breath on exertion 08/04/2021   Diabetes mellitus with proteinuria (HCC) 04/04/2021   GERD without esophagitis 04/02/2021   Coronary artery disease 12/28/2020   History of non-ST elevation myocardial infarction (NSTEMI) 12/21/2020   Pain in right shin 10/25/2020   Peripheral vascular disease (HCC) 08/06/2020   Chronic pain syndrome 07/06/2020   Cervical facet joint syndrome 04/08/2020   Spinal cord stimulator status 12/11/2019   Elevated TSH 08/20/2019   CKD (chronic kidney disease) stage 3, GFR 30-59 ml/min (HCC) 01/19/2019   Acquired thrombophilia (HCC) 01/19/2019   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   Postlaminectomy syndrome, lumbar region 01/16/2019   History of fusion of lumbar spine (L2-L5) 01/16/2019   Chronic radicular lumbar pain 01/16/2019   HNP (herniated nucleus pulposus), lumbar 04/29/2018   Advanced care planning/counseling discussion 09/28/2016   Spinal stenosis, lumbar region, 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/13/2016   Hyperlipidemia associated with type 2 diabetes mellitus (HCC) 07/14/2015   Symptomatic anemia 06/30/2015   Benign prostatic hyperplasia without lower urinary tract symptoms 06/02/2015   OSA (obstructive sleep apnea) 03/23/2015   Hypertension associated with diabetes (HCC) 09/28/2014   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HCC) 09/28/2014   H/O prior ablation treatment 10/19/2011   AF (paroxysmal atrial fibrillation) (HCC) 10/19/2011   PCP: Lemar Pyles, NP  REFERRING PROVIDER: Rosan Comfort, MD   REFERRING DIAG: Peripheral Neuropathy  RATIONALE FOR EVALUATION AND TREATMENT: Rehabilitation  THERAPY DIAG: Difficulty in walking, not elsewhere  classified  Muscle weakness (generalized)  ONSET DATE: 01/25/23 (acute on chronic)  FOLLOW-UP APPT SCHEDULED WITH REFERRING PROVIDER: Yes   FROM INITIAL EVALUATION SUBJECTIVE:                                                                                                                                                                                         SUBJECTIVE STATEMENT:  Imbalance and LE weakness;  PERTINENT HISTORY:  Lower extremity weakness in patient with history of 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 He reports weakness in both legs and numbness in toes post surgery in 2018. He experienced a fall on 01/25/2023 at home, resulting in a L hip injury. No residual pain in hip at this time. No additional falls since December. He reports progressive LE weakness impairing his functional ability at home. He is known to this clinic from prior episodes of care for similar concerns.  Prior history: 11/02/22 Patient reports to physical therapy today with a chief concern of muscle weakness and imbalance. Patient reports progressive worsening in balance and muscular strength. He states that he has loss of sensation in his toes he reports they are "asleep". Patient reports pain in the anterior thigh and bilateral calves. He currently uses an loftstand crutch in his RUE. Lately patient has reported difficulty with dressing LE, performing fine motor movements with left hand. He denies falls, saddle parasthesia, nausea, vomitting, night sweats.    10/17/2022 Patient reported he is not doing well lately. He has been seeing Dr. Mont Antis. He reports his legs are so weak that he can hardly walk, so  he has been using a crutch for the last 1-2 years. Reported numbness and loss of sensation in his toes. He also reported that recently his left hand has began to has some numbness to where it feels like it is asleep. Denies pain, loss of sensation, coldness, burning. Denies falls."  Imaging: NCS conducted on 11/12/2018: Abnormal study. 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 There is also preliminary evidence of a superimposed left lower lumbosacral polyradiculopathy, based on NCV and distal needle exam results. I cannot test lumbar paraspinals due to patient on Eliquis .    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 Yes. 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  Recent memory is intact.  Remote memory is intact.  Attention span and concentration are intact.  Expressive speech is intact.  Patient's fund of knowledge is within normal limits for educational level.    Gross Musculoskeletal Assessment Tremor: None Bulk: Normal Tone: Normal  Posture: Forward  head and rounded shoulders  AROM Deferred specific measurements. Functional motion intact;  LE MMT: MMT (out of 5) Right  Left   Hip flexion 4 4+  Hip extension    Hip abduction (seated) 3+ 3+  Hip adduction (seated 3+ 3+  Hip internal rotation    Hip external rotation    Knee flexion (seated) 4 4  Knee extension 4 4  Ankle dorsiflexion 4 4  Ankle plantarflexion    Ankle inversion    Ankle eversion    (* = pain; Blank rows = not tested)  Transfers: Assistive device utilized: Single Lofstrand RUE  Sit to stand: Modified independence Stand to sit: Modified independence Chair to chair: Modified independence Floor: Deferred  Stairs: Level of Assistance: CGA Stair Negotiation Technique: Alternating Pattern  with Bilateral Rails Number of Stairs: 4  Height of Stairs: 6"  Comments: Slow and labored ascending/descending. No overt LOB;  Gait: Gait pattern: decreased step length- Right, decreased step length- Left, trunk flexed, poor foot clearance- Right, and poor foot clearance- Left Distance walked: 150' over the course of evaluation Assistive device utilized: Single Lofstrand in RUE Level of assistance: Modified independence Comments: Slow and labored ambulation. Decreased self selected speed  Functional Outcome Measures  Results Comments  BERG 40/56 Increased fall risk  DGI    FGA    TUG 22.2 seconds Increased fall risk  5TSTS Unable Increased fall risk  6 Minute Walk Test    10 Meter Gait Speed Self-selected: 20.5s = 0.49 m/s; Fastest: 18.0s = 0.56 m/s Below community ambulation speed  (Blank  rows = not tested)   TODAY'S TREATMENT    SUBJECTIVE: Pt reports that he is doing alright today. No excessive soreness reported after the last therapy session. He continues to feel like his legs are getting weaker. Ongoing chronic low back pain.   PAIN: Chronic low back pain.   Therapeutic Activity NuStep (seat 12, hands 13) L0-4 x 10 minutes for BLE strengthening and warm-up during interval history (5 minutes unbilled);  Updated outcome measures with patient: ABC: 30% BERG: 50/56 73m Gait Speed: 19.1s = 0.52 m/s 5TSTS: 11.4s TUG: 14.0s;  Standing mini squats x 15; Standing heel raises x 15;   PATIENT EDUCATION:  Education details: Pt educated throughout session about proper posture and technique with exercises. Improved exercise technique, movement at target joints, use of target muscles after min to mod verbal, visual, tactile cues.  Person educated: Patient Education method: Explanation, Verbal cues, and Handouts Education comprehension: verbalized understanding and returned demonstration   HOME EXERCISE PROGRAM:  Access Code: [email or web address] URL: [email or web address] Date: 05/31/2023 Prepared by: Crawford Dock  Exercises - Seated Long Arc Quad  - 1 x daily - 3-4 x weekly - 3 sets - 10 reps - 2s hold - Seated Hip Abduction with Resistance  - 1 x daily - 3-4 x weekly - 3 sets - 10 reps - 2s hold - Heel Raises with Counter Support  - 1 x daily - 3-4 x weekly - 3 sets - 10 reps - 2s hold - Mini Squat with Counter Support  - 1 x daily - 3-4 x weekly - 3 sets - 10 reps - Semi-Tandem Balance at [employer] Eyes Open  - 1 x daily - 7 x weekly - 2-3 sets - 10 reps - 10-30 hold   ASSESSMENT:  CLINICAL IMPRESSION: Updated outcome measures/goals with patient today. He has made significant progress since his initial evaluation. His BERG balance score improved from 40/56 at the  initial evaluation to 50/56 today. His balance confidence increased from 20% initially to  30% today however he still has considerable room for improvement. He also demonstrated significant improvement in his BLE strength with large improvements in both his 5TSTS and his TUG scores. His 79m gait speed improved but is still below the required speed for community ambulation. No HEP modifications at this time. Pt encouraged to follow-up as scheduled. Will introduce balance exercises at future session as necessary/appropriate. He will benefit from PT services to address deficits in strength, balance, and mobility in order to return to full function at home and decrease his risk for falls.      OBJECTIVE IMPAIRMENTS: Abnormal gait, decreased balance, difficulty walking, decreased strength, impaired sensation, and pain.   ACTIVITY LIMITATIONS: bending, standing, squatting, stairs, transfers, and caring for others  PARTICIPATION LIMITATIONS: meal prep, cleaning, laundry, shopping, and community activity  PERSONAL FACTORS: Age, Past/current experiences, Time since onset of injury/illness/exacerbation, and 3+ comorbidities: generalized polyneuropathy, COPD, DM, NSTEMI, and spinal stenosis are also affecting patient's functional outcome.   REHAB POTENTIAL: Fair    CLINICAL DECISION MAKING: Unstable/unpredictable  EVALUATION COMPLEXITY: High   GOALS: Goals reviewed with patient? No  SHORT TERM GOALS: Target date: 07/10/2023  Pt will be independent with HEP in order to improve strength and balance in order to decrease fall risk and improve function at home. Baseline:  Goal status: ONGOING   LONG TERM GOALS: Target date: 08/21/2023  Pt will improve ABC by at least 13% in order to demonstrate clinically significant improvement in balance confidence.  Baseline: 20%; 07/10/23: 30% Goal status: PARTIALLY MET  2.  Pt will improve BERG by at least 3 points in order to demonstrate clinically significant improvement in balance.   Baseline: 40/56; 07/10/23: 50/56 Goal status: ACHIEVED  3.  Pt  will increase self-selected to at least 0.6 m/s in order to demonstrate clinically significant improvement in limited community ambulation.         Baseline: self-selected: 0.49 m/s; 07/10/23: 0.52 m/s Goal status: REVISED  4. Pt will be able to complete 5TSTS without UE support in order to demonstrate clinically significant improvement in LE strength      Baseline: Unable to perform sit to stand without heavy UE assist; 07/10/23: 11.4s Goal status: ACHIEVED  5. Pt will decrease TUG to below 14 seconds/decrease in order to demonstrate decreased fall risk.  Baseline: 22.2s; 07/10/23: 14.0s;  Goal status: PARTIALLY MET  PLAN: PT FREQUENCY: 2x/week  PT DURATION: 12 weeks  PLANNED INTERVENTIONS: Therapeutic exercises, Therapeutic activity, Neuromuscular re-education, Balance training, Gait training, Patient/Family education, Self Care, Joint mobilization, Joint manipulation, Vestibular training, Canalith repositioning, Orthotic/Fit training, DME instructions, Dry Needling, Electrical stimulation, Spinal manipulation, Spinal mobilization, Cryotherapy, Moist heat, Taping, Traction, Ultrasound, Ionotophoresis 4mg /ml Dexamethasone , Manual therapy, and Re-evaluation.  PLAN FOR NEXT SESSION: progress strengthening and introduce balance exercises, modify/progress HEP as needed;    Pratt Bress D Makaiya Geerdes PT, DPT, GCS  12:46 PM,07/11/23

## 2023-07-12 ENCOUNTER — Ambulatory Visit

## 2023-07-12 DIAGNOSIS — M5459 Other low back pain: Secondary | ICD-10-CM | POA: Diagnosis not present

## 2023-07-12 DIAGNOSIS — R2681 Unsteadiness on feet: Secondary | ICD-10-CM | POA: Diagnosis not present

## 2023-07-12 DIAGNOSIS — M6281 Muscle weakness (generalized): Secondary | ICD-10-CM

## 2023-07-12 DIAGNOSIS — R262 Difficulty in walking, not elsewhere classified: Secondary | ICD-10-CM

## 2023-07-17 ENCOUNTER — Ambulatory Visit: Attending: Neurology

## 2023-07-17 DIAGNOSIS — R2681 Unsteadiness on feet: Secondary | ICD-10-CM | POA: Diagnosis not present

## 2023-07-17 DIAGNOSIS — R262 Difficulty in walking, not elsewhere classified: Secondary | ICD-10-CM | POA: Diagnosis not present

## 2023-07-17 DIAGNOSIS — M6281 Muscle weakness (generalized): Secondary | ICD-10-CM | POA: Insufficient documentation

## 2023-07-17 NOTE — Therapy (Unsigned)
 OUTPATIENT PHYSICAL THERAPY BALANCE TREATMENT   Patient Name: Sean Reyes. MRN: [id number] DOB:1940-09-27, 83 y.o., male Today's Date: 07/18/2023  END OF SESSION:  PT End of Session - 07/17/23 1338     Visit Number 12    Number of Visits 25    Date for PT Re-Evaluation 08/21/23    Authorization Type eval: 05/29/23;    Authorization Time Period Medicare A&B 2025  VL: Based on [place]  No auth req    PT Start Time 1400    PT Stop Time 1445    PT Time Calculation (min) 45 min    Equipment Utilized During Treatment Gait belt    Activity Tolerance Patient tolerated treatment well;Patient limited by pain    Behavior During Therapy WFL for tasks assessed/performed            Past Medical History:  Diagnosis Date   Anemia    Anxiety    Arthritis    Arthritis of neck    Atrial fibrillation (HCC)    Cataracts, bilateral    Complication of anesthesia    pt reports low BP's after surgery at [facility] and difficulty awakening   Depression    Diabetes (HCC)    dx 6-8 yrs ago   Dysrhythmia    a-fib   GERD (gastroesophageal reflux disease)    OCC TAKES ALKA SELTZER   History of kidney stones    10-15 yrs ago   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   Hyperlipidemia    Hypertension    Myocardial infarction (HCC) 12/2020   Nocturia    S/P ablation of atrial fibrillation    Ablative therapy   Sleep apnea    CPAP    Spinal stenosis    Tachycardia, unspecified    Past Surgical History:  Procedure Laterality Date   ABLATION     ANTERIOR LAT LUMBAR FUSION N/A 06/27/2017   Procedure: Anterior Lateral Lumbar Interbody  Fusion - Lumbar Two-Lumbar Three - Lumbar Three-Lumbar Four, Posterior Lumbar Interbody Fusion Lumbar Four- Five;  Surgeon: Gearl Keens, MD;  Location: [facility] OR;  Service: Neurosurgery;  Laterality: N/A;  Anterior Lateral Lumbar Interbody  Fusion - Lumbar Two-Lumbar Three - Lumbar Three-Lumbar Four, Posterior Lumbar Interbody Fusion Lumbar Four- Five    BACK SURGERY     CARDIAC CATHETERIZATION     CARDIOVERSION N/A 08/29/2018   Procedure: CARDIOVERSION;  Surgeon: Michelle Aid, MD;  Location: [facility] ORS;  Service: Cardiovascular;  Laterality: N/A;   CARDIOVERSION N/A 09/24/2018   Procedure: CARDIOVERSION;  Surgeon: Michelle Aid, MD;  Location: [facility] ORS;  Service: Cardiovascular;  Laterality: N/A;   COLONOSCOPY WITH PROPOFOL  N/A 10/05/2015   Procedure: COLONOSCOPY WITH PROPOFOL ;  Surgeon: Deveron Fly, MD;  Location: [facility] ENDOSCOPY;  Service: Endoscopy;  Laterality: N/A;   COLONOSCOPY WITH PROPOFOL  N/A 11/01/2020   Procedure: COLONOSCOPY WITH PROPOFOL ;  Surgeon: Luke Salaam, MD;  Location: [facility] ENDOSCOPY;  Service: Gastroenterology;  Laterality: N/A;   CORONARY STENT INTERVENTION N/A 12/22/2020   Procedure: CORONARY STENT INTERVENTION;  Surgeon: Percival Brace, MD;  Location: [facility] INVASIVE CV LAB;  Service: Cardiovascular;  Laterality: N/A;   ESOPHAGOGASTRODUODENOSCOPY N/A 11/01/2020   Procedure: ESOPHAGOGASTRODUODENOSCOPY (EGD);  Surgeon: Luke Salaam, MD;  Location: [facility] ENDOSCOPY;  Service: Gastroenterology;  Laterality: N/A;   ESOPHAGOGASTRODUODENOSCOPY (EGD) WITH PROPOFOL  N/A 04/01/2018   Procedure: ESOPHAGOGASTRODUODENOSCOPY (EGD) WITH PROPOFOL ;  Surgeon: Deveron Fly, MD;  Location: [facility] ENDOSCOPY;  Service: Endoscopy;  Laterality: N/A;   ESOPHAGOGASTRODUODENOSCOPY (  EGD) WITH PROPOFOL  N/A 06/01/2022   Procedure: ESOPHAGOGASTRODUODENOSCOPY (EGD) WITH PROPOFOL ;  Surgeon: Luke Salaam, MD;  Location: [facility] ENDOSCOPY;  Service: Gastroenterology;  Laterality: N/A;   EYE SURGERY     HERNIA REPAIR     JOINT REPLACEMENT Bilateral    hips  RT+  LEFT X2    LEFT HEART CATH AND CORONARY ANGIOGRAPHY N/A 12/22/2020   Procedure: LEFT HEART CATH AND CORONARY ANGIOGRAPHY;  Surgeon: Percival Brace, MD;  Location: [facility] INVASIVE CV LAB;  Service: Cardiovascular;  Laterality: N/A;   LEFT HEART CATH AND CORONARY ANGIOGRAPHY Left  08/23/2021   Procedure: LEFT HEART CATH AND CORONARY ANGIOGRAPHY;  Surgeon: Michelle Aid, MD;  Location: [facility] INVASIVE CV LAB;  Service: Cardiovascular;  Laterality: Left;   LUMBAR LAMINECTOMY/DECOMPRESSION MICRODISCECTOMY Left 09/13/2016   Procedure: Microdiscectomy - Lumbar two-three,  Lumbar three- - left;  Surgeon: Gearl Keens, MD;  Location: [facility] OR;  Service: Neurosurgery;  Laterality: Left;   SPINAL CORD STIMULATOR INSERTION  07/08/2019   TONSILLECTOMY     Patient Active Problem List   Diagnosis Date Noted   Lip swelling 07/04/2023   Diabetes mellitus treated with oral medication (HCC) 04/05/2023   Subareolar mass of left breast 10/03/2022   COPD (chronic obstructive pulmonary disease) (HCC) 10/31/2021   Shortness of breath on exertion 08/04/2021   Diabetes mellitus with proteinuria (HCC) 04/04/2021   GERD without esophagitis 04/02/2021   Coronary artery disease 12/28/2020   History of non-ST elevation myocardial infarction (NSTEMI) 12/21/2020   Pain in right shin 10/25/2020   Peripheral vascular disease (HCC) 08/06/2020   Chronic pain syndrome 07/06/2020   Cervical facet joint syndrome 04/08/2020   Spinal cord stimulator status 12/11/2019   Elevated TSH 08/20/2019   CKD (chronic kidney disease) stage 3, GFR 30-59 ml/min (HCC) 01/19/2019   Acquired thrombophilia (HCC) 01/19/2019   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   Postlaminectomy syndrome, lumbar region 01/16/2019   History of fusion of lumbar spine (L2-L5) 01/16/2019   Chronic radicular lumbar pain 01/16/2019   HNP (herniated nucleus pulposus), lumbar 04/29/2018   Advanced care planning/counseling discussion 09/28/2016   Spinal stenosis, lumbar region, 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/13/2016   Hyperlipidemia associated with type 2 diabetes mellitus (HCC) 07/14/2015   Symptomatic anemia 06/30/2015   Benign prostatic hyperplasia without lower urinary tract symptoms 06/02/2015   OSA (obstructive sleep apnea)  03/23/2015   Hypertension associated with diabetes (HCC) 09/28/2014   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HCC) 09/28/2014   H/O prior ablation treatment 10/19/2011   AF (paroxysmal atrial fibrillation) (HCC) 10/19/2011   PCP: Lemar Pyles, NP  REFERRING PROVIDER: Rosan Comfort, MD   REFERRING DIAG: Peripheral Neuropathy  RATIONALE FOR EVALUATION AND TREATMENT: Rehabilitation  THERAPY DIAG: Difficulty in walking, not elsewhere classified  Muscle weakness (generalized)  ONSET DATE: 01/25/23 (acute on chronic)  FOLLOW-UP APPT SCHEDULED WITH REFERRING PROVIDER: Yes   FROM INITIAL EVALUATION SUBJECTIVE:  SUBJECTIVE STATEMENT:  Imbalance and LE weakness;  PERTINENT HISTORY:  Lower extremity weakness in patient with 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 He reports weakness in both legs and numbness in toes post surgery in 2018. He experienced a fall on 01/25/2023 at home, resulting in a L hip injury. No residual pain in hip at this time. No additional falls since December. He reports progressive LE weakness impairing his functional ability at home. He is known to this clinic from prior episodes of care for similar concerns.  Prior history: 11/02/22 Patient reports to physical therapy today with a chief concern of muscle weakness and imbalance. Patient reports progressive worsening in balance and muscular strength. He states that he has loss of sensation in his toes he reports they  are "asleep". Patient reports pain in the anterior thigh and bilateral calves. He currently uses an loftstand crutch in his RUE. Lately patient has reported difficulty with dressing LE, performing fine motor movements with left hand. He denies falls, saddle parasthesia, nausea, vomitting, night sweats.    10/17/2022 Patient reported he is not doing well lately. He has been seeing Dr. Mont Antis. He reports his legs are so weak that he can hardly walk, so he has been using a crutch for the last 1-2 years. Reported numbness and loss of sensation in his toes. He also reported that recently his left hand has began to has some numbness to where it feels like it is asleep. Denies pain, loss of sensation, coldness, burning. Denies falls."  Imaging: NCS conducted on 11/12/2018: Abnormal study. 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 There is also preliminary evidence of a superimposed left lower lumbosacral polyradiculopathy, based on NCV and distal needle exam results. I cannot test lumbar paraspinals due to patient on Eliquis .    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 Yes. 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  Recent memory is intact.  Remote memory is intact.  Attention span and concentration are intact.  Expressive speech is intact.  Patient's fund of knowledge is  within normal limits for educational level.    Gross Musculoskeletal Assessment Tremor: None Bulk: Normal Tone: Normal  Posture: Forward head and rounded shoulders  AROM Deferred specific measurements. Functional motion intact;  LE MMT: MMT (out of 5) Right  Left   Hip flexion 4 4+  Hip extension    Hip abduction (seated) 3+ 3+  Hip adduction (seated 3+ 3+  Hip internal rotation    Hip external rotation    Knee flexion (seated) 4 4  Knee extension 4 4  Ankle dorsiflexion 4 4  Ankle plantarflexion    Ankle inversion    Ankle eversion    (* = pain; Blank rows = not tested)  Transfers: Assistive device utilized: Single Lofstrand RUE  Sit to stand: Modified independence Stand to sit: Modified independence Chair to chair: Modified independence Floor: Deferred  Stairs: Level of Assistance: CGA Stair Negotiation Technique: Alternating Pattern  with Bilateral Rails Number of Stairs: 4  Height of Stairs: 6"  Comments: Slow and labored ascending/descending. No overt LOB;  Gait: Gait pattern: decreased step length- Right, decreased step length- Left, trunk flexed, poor foot clearance- Right, and poor foot clearance- Left Distance walked: 150' over the course of evaluation Assistive device  utilized: Single Lofstrand in RUE Level of assistance: Modified independence Comments: Slow and labored ambulation. Decreased self selected speed  Functional Outcome Measures  Results Comments  BERG 40/56 Increased fall risk  DGI    FGA    TUG 22.2 seconds Increased fall risk  5TSTS Unable Increased fall risk  6 Minute Walk Test    10 Meter Gait Speed Self-selected: 20.5s = 0.49 m/s; Fastest: 18.0s = 0.56 m/s Below community ambulation speed  (Blank rows = not tested)   TODAY'S TREATMENT    SUBJECTIVE: Pt reports that he is doing alright today. No excessive soreness reported after the last therapy session. Ongoing chronic low back pain. No specific questions or concerns currently.   PAIN: Chronic low back pain.   Therapeutic Activity NuStep (seat 12, hands 13) L0-4 x 10 minutes for BLE strengthening and warm-up during interval history (5 minutes unbilled);  Seated LAQ with 4# ankle weights x 15 BLE; Standing hip strengthening with 4# ankle weights: Hip flexion marches x 15 BLE; HS curls x 15 BLE; Hip abduction x 15 BLE; Hip extension x 15 BLE;  Seated clams with manual resistance x 20 BLE; Seated adductor squeezes with manual resistance x 20 BLE; Sit to stand from regular height chair with Airex pad on seat and 6# overhead ball med ball lifts 2 x 10; Standing mini squats 2 x 10; Standing heel raises x 20;   PATIENT EDUCATION:  Education details: Pt educated throughout session about proper posture and technique with exercises. Improved exercise technique, movement at target joints, use of target muscles after min to mod verbal, visual, tactile cues.  Person educated: Patient Education method: Explanation, Verbal cues, and Handouts Education comprehension: verbalized understanding and returned demonstration   HOME EXERCISE PROGRAM:  Access Code: [email or web address] URL: [email or web address] Date: 05/31/2023 Prepared by: Crawford Dock  Exercises - Seated Long  Arc Quad  - 1 x daily - 3-4 x weekly - 3 sets - 10 reps - 2s hold - Seated Hip Abduction with Resistance  - 1 x daily - 3-4 x weekly - 3 sets - 10 reps - 2s hold - Heel Raises with Counter Support  - 1 x daily - 3-4 x weekly - 3 sets - 10 reps - 2s hold - Mini  Squat with Counter Support  - 1 x daily - 3-4 x weekly - 3 sets - 10 reps - Semi-Tandem Balance at [employer] Eyes Open  - 1 x daily - 7 x weekly - 2-3 sets - 10 reps - 10-30 hold   ASSESSMENT:  CLINICAL IMPRESSION: Progressed strengthening exercises during session today with patient. Repeated 4# ankle weights but increased reps today. Less fatigue noted during session today. No HEP modifications at this time. Pt encouraged to follow-up as scheduled. Will introduce balance exercises at future session as necessary/appropriate. Currently his LE weakness is the largest limitation. He will benefit from PT services to address deficits in strength, balance, and mobility in order to return to full function at home and decrease his risk for falls.         OBJECTIVE IMPAIRMENTS: Abnormal gait, decreased balance, difficulty walking, decreased strength, impaired sensation, and pain.   ACTIVITY LIMITATIONS: bending, standing, squatting, stairs, transfers, and caring for others  PARTICIPATION LIMITATIONS: meal prep, cleaning, laundry, shopping, and community activity  PERSONAL FACTORS: Age, Past/current experiences, Time since onset of injury/illness/exacerbation, and 3+ comorbidities: generalized polyneuropathy, COPD, DM, NSTEMI, and spinal stenosis are also affecting patient's functional outcome.   REHAB POTENTIAL: Fair    CLINICAL DECISION MAKING: Unstable/unpredictable  EVALUATION COMPLEXITY: High   GOALS: Goals reviewed with patient? No  SHORT TERM GOALS: Target date: 07/10/2023  Pt will be independent with HEP in order to improve strength and balance in order to decrease fall risk and improve function at home. Baseline:  Goal status:  ONGOING   LONG TERM GOALS: Target date: 08/21/2023  Pt will improve ABC by at least 13% in order to demonstrate clinically significant improvement in balance confidence.  Baseline: 20%; 07/10/23: 30% Goal status: PARTIALLY MET  2.  Pt will improve BERG by at least 3 points in order to demonstrate clinically significant improvement in balance.   Baseline: 40/56; 07/10/23: 50/56 Goal status: ACHIEVED  3.  Pt will increase self-selected to at least 0.6 m/s in order to demonstrate clinically significant improvement in limited community ambulation.         Baseline: self-selected: 0.49 m/s; 07/10/23: 0.52 m/s Goal status: REVISED  4. Pt will be able to complete 5TSTS without UE support in order to demonstrate clinically significant improvement in LE strength      Baseline: Unable to perform sit to stand without heavy UE assist; 07/10/23: 11.4s Goal status: ACHIEVED  5. Pt will decrease TUG to below 14 seconds/decrease in order to demonstrate decreased fall risk.  Baseline: 22.2s; 07/10/23: 14.0s;  Goal status: PARTIALLY MET  PLAN: PT FREQUENCY: 2x/week  PT DURATION: 12 weeks  PLANNED INTERVENTIONS: Therapeutic exercises, Therapeutic activity, Neuromuscular re-education, Balance training, Gait training, Patient/Family education, Self Care, Joint mobilization, Joint manipulation, Vestibular training, Canalith repositioning, Orthotic/Fit training, DME instructions, Dry Needling, Electrical stimulation, Spinal manipulation, Spinal mobilization, Cryotherapy, Moist heat, Taping, Traction, Ultrasound, Ionotophoresis 4mg /ml Dexamethasone , Manual therapy, and Re-evaluation.  PLAN FOR NEXT SESSION: progress strengthening and introduce balance exercises, modify/progress HEP as needed;    Roma Bondar D Landry Kamath PT, DPT, GCS  9:20 AM,07/18/23

## 2023-07-19 ENCOUNTER — Ambulatory Visit

## 2023-07-19 DIAGNOSIS — R262 Difficulty in walking, not elsewhere classified: Secondary | ICD-10-CM

## 2023-07-19 DIAGNOSIS — M6281 Muscle weakness (generalized): Secondary | ICD-10-CM | POA: Diagnosis not present

## 2023-07-19 DIAGNOSIS — R2681 Unsteadiness on feet: Secondary | ICD-10-CM | POA: Diagnosis not present

## 2023-07-19 NOTE — Therapy (Signed)
 OUTPATIENT PHYSICAL THERAPY BALANCE TREATMENT   Patient Name: Sean Reyes. MRN: [id number] DOB:Aug 09, 1940, 83 y.o., male Today's Date: 07/19/2023  END OF SESSION:  PT End of Session - 07/19/23 1313     Visit Number 13    Number of Visits 25    Date for PT Re-Evaluation 08/21/23    Authorization Type eval: 05/29/23;    Authorization Time Period Medicare A&B 2025  VL: Based on MN  No auth req    PT Start Time 1315    PT Stop Time 1400    PT Time Calculation (min) 45 min    Equipment Utilized During Treatment Gait belt    Activity Tolerance Patient tolerated treatment well;Patient limited by fatigue;Patient limited by pain    Behavior During Therapy WFL for tasks assessed/performed            Past Medical History:  Diagnosis Date   Anemia    Anxiety    Arthritis    Arthritis of neck    Atrial fibrillation (HCC)    Cataracts, bilateral    Complication of anesthesia    pt reports low BP's after surgery at [facility] and difficulty awakening   Depression    Diabetes (HCC)    dx 6-8 yrs ago   Dysrhythmia    a-fib   GERD (gastroesophageal reflux disease)    OCC TAKES ALKA SELTZER   History of kidney stones    10-15 yrs ago   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   Hyperlipidemia    Hypertension    Myocardial infarction (HCC) 12/2020   Nocturia    S/P ablation of atrial fibrillation    Ablative therapy   Sleep apnea    CPAP    Spinal stenosis    Tachycardia, unspecified    Past Surgical History:  Procedure Laterality Date   ABLATION     ANTERIOR LAT LUMBAR FUSION N/A 06/27/2017   Procedure: Anterior Lateral Lumbar Interbody  Fusion - Lumbar Two-Lumbar Three - Lumbar Three-Lumbar Four, Posterior Lumbar Interbody Fusion Lumbar Four- Five;  Surgeon: Gearl Keens, MD;  Location: [facility] OR;  Service: Neurosurgery;  Laterality: N/A;  Anterior Lateral Lumbar Interbody  Fusion - Lumbar Two-Lumbar Three - Lumbar Three-Lumbar Four, Posterior Lumbar Interbody  Fusion Lumbar Four- Five   BACK SURGERY     CARDIAC CATHETERIZATION     CARDIOVERSION N/A 08/29/2018   Procedure: CARDIOVERSION;  Surgeon: Michelle Aid, MD;  Location: [facility] ORS;  Service: Cardiovascular;  Laterality: N/A;   CARDIOVERSION N/A 09/24/2018   Procedure: CARDIOVERSION;  Surgeon: Michelle Aid, MD;  Location: [facility] ORS;  Service: Cardiovascular;  Laterality: N/A;   COLONOSCOPY WITH PROPOFOL  N/A 10/05/2015   Procedure: COLONOSCOPY WITH PROPOFOL ;  Surgeon: Deveron Fly, MD;  Location: [facility] ENDOSCOPY;  Service: Endoscopy;  Laterality: N/A;   COLONOSCOPY WITH PROPOFOL  N/A 11/01/2020   Procedure: COLONOSCOPY WITH PROPOFOL ;  Surgeon: Luke Salaam, MD;  Location: [facility] ENDOSCOPY;  Service: Gastroenterology;  Laterality: N/A;   CORONARY STENT INTERVENTION N/A 12/22/2020   Procedure: CORONARY STENT INTERVENTION;  Surgeon: Percival Brace, MD;  Location: [facility] INVASIVE CV LAB;  Service: Cardiovascular;  Laterality: N/A;   ESOPHAGOGASTRODUODENOSCOPY N/A 11/01/2020   Procedure: ESOPHAGOGASTRODUODENOSCOPY (EGD);  Surgeon: Luke Salaam, MD;  Location: [facility] ENDOSCOPY;  Service: Gastroenterology;  Laterality: N/A;   ESOPHAGOGASTRODUODENOSCOPY (EGD) WITH PROPOFOL  N/A 04/01/2018   Procedure: ESOPHAGOGASTRODUODENOSCOPY (EGD) WITH PROPOFOL ;  Surgeon: Deveron Fly, MD;  Location: [facility] ENDOSCOPY;  Service: Endoscopy;  Laterality: N/A;  ESOPHAGOGASTRODUODENOSCOPY (EGD) WITH PROPOFOL  N/A 06/01/2022   Procedure: ESOPHAGOGASTRODUODENOSCOPY (EGD) WITH PROPOFOL ;  Surgeon: Luke Salaam, MD;  Location: [facility] ENDOSCOPY;  Service: Gastroenterology;  Laterality: N/A;   EYE SURGERY     HERNIA REPAIR     JOINT REPLACEMENT Bilateral    hips  RT+  LEFT X2    LEFT HEART CATH AND CORONARY ANGIOGRAPHY N/A 12/22/2020   Procedure: LEFT HEART CATH AND CORONARY ANGIOGRAPHY;  Surgeon: Percival Brace, MD;  Location: [facility] INVASIVE CV LAB;  Service: Cardiovascular;  Laterality: N/A;   LEFT HEART CATH AND  CORONARY ANGIOGRAPHY Left 08/23/2021   Procedure: LEFT HEART CATH AND CORONARY ANGIOGRAPHY;  Surgeon: Michelle Aid, MD;  Location: [facility] INVASIVE CV LAB;  Service: Cardiovascular;  Laterality: Left;   LUMBAR LAMINECTOMY/DECOMPRESSION MICRODISCECTOMY Left 09/13/2016   Procedure: Microdiscectomy - Lumbar two-three,  Lumbar three- - left;  Surgeon: Gearl Keens, MD;  Location: [facility] OR;  Service: Neurosurgery;  Laterality: Left;   SPINAL CORD STIMULATOR INSERTION  07/08/2019   TONSILLECTOMY     Patient Active Problem List   Diagnosis Date Noted   Lip swelling 07/04/2023   Diabetes mellitus treated with oral medication (HCC) 04/05/2023   Subareolar mass of left breast 10/03/2022   COPD (chronic obstructive pulmonary disease) (HCC) 10/31/2021   Shortness of breath on exertion 08/04/2021   Diabetes mellitus with proteinuria (HCC) 04/04/2021   GERD without esophagitis 04/02/2021   Coronary artery disease 12/28/2020   History of non-ST elevation myocardial infarction (NSTEMI) 12/21/2020   Pain in right shin 10/25/2020   Peripheral vascular disease (HCC) 08/06/2020   Chronic pain syndrome 07/06/2020   Cervical facet joint syndrome 04/08/2020   Spinal cord stimulator status 12/11/2019   Elevated TSH 08/20/2019   CKD (chronic kidney disease) stage 3, GFR 30-59 ml/min (HCC) 01/19/2019   Acquired thrombophilia (HCC) 01/19/2019   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   Postlaminectomy syndrome, lumbar region 01/16/2019   History of fusion of lumbar spine (L2-L5) 01/16/2019   Chronic radicular lumbar pain 01/16/2019   HNP (herniated nucleus pulposus), lumbar 04/29/2018   Advanced care planning/counseling discussion 09/28/2016   Spinal stenosis, lumbar region, 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/13/2016   Hyperlipidemia associated with type 2 diabetes mellitus (HCC) 07/14/2015   Symptomatic anemia 06/30/2015   Benign prostatic hyperplasia without lower urinary tract symptoms 06/02/2015   OSA  (obstructive sleep apnea) 03/23/2015   Hypertension associated with diabetes (HCC) 09/28/2014   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HCC) 09/28/2014   H/O prior ablation treatment 10/19/2011   AF (paroxysmal atrial fibrillation) (HCC) 10/19/2011   PCP: Lemar Pyles, NP  REFERRING PROVIDER: Rosan Comfort, MD   REFERRING DIAG: Peripheral Neuropathy  RATIONALE FOR EVALUATION AND TREATMENT: Rehabilitation  THERAPY DIAG: Difficulty in walking, not elsewhere classified  Muscle weakness (generalized)  ONSET DATE: 01/25/23 (acute on chronic)  FOLLOW-UP APPT SCHEDULED WITH REFERRING PROVIDER: Yes   FROM INITIAL EVALUATION SUBJECTIVE:  SUBJECTIVE STATEMENT:  Imbalance and LE weakness;  PERTINENT HISTORY:  Lower extremity weakness in patient with 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 He reports weakness in both legs and numbness in toes post surgery in 2018. He experienced a fall on 01/25/2023 at home, resulting in a L hip injury. No residual pain in hip at this time. No additional falls since December. He reports progressive LE weakness impairing his functional ability at home. He is known to this clinic from prior episodes of care for similar concerns.  Prior history: 11/02/22 Patient reports to physical therapy today with a chief concern of muscle weakness and imbalance. Patient reports progressive worsening in balance and muscular strength. He states that he has loss of sensation in  his toes he reports they are "asleep". Patient reports pain in the anterior thigh and bilateral calves. He currently uses an loftstand crutch in his RUE. Lately patient has reported difficulty with dressing LE, performing fine motor movements with left hand. He denies falls, saddle parasthesia, nausea, vomitting, night sweats.    10/17/2022 Patient reported he is not doing well lately. He has been seeing Dr. Mont Antis. He reports his legs are so weak that he can hardly walk, so he has been using a crutch for the last 1-2 years. Reported numbness and loss of sensation in his toes. He also reported that recently his left hand has began to has some numbness to where it feels like it is asleep. Denies pain, loss of sensation, coldness, burning. Denies falls."  Imaging: NCS conducted on 11/12/2018: Abnormal study. 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 There is also preliminary evidence of a superimposed left lower lumbosacral polyradiculopathy, based on NCV and distal needle exam results. I cannot test lumbar paraspinals due to patient on Eliquis .    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 Yes. 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  Recent memory is intact.  Remote memory is intact.  Attention span and concentration are intact.  Expressive speech is intact.  Patient's fund of knowledge is  within normal limits for educational level.    Gross Musculoskeletal Assessment Tremor: None Bulk: Normal Tone: Normal  Posture: Forward head and rounded shoulders  AROM Deferred specific measurements. Functional motion intact;  LE MMT: MMT (out of 5) Right  Left   Hip flexion 4 4+  Hip extension    Hip abduction (seated) 3+ 3+  Hip adduction (seated 3+ 3+  Hip internal rotation    Hip external rotation    Knee flexion (seated) 4 4  Knee extension 4 4  Ankle dorsiflexion 4 4  Ankle plantarflexion    Ankle inversion    Ankle eversion    (* = pain; Blank rows = not tested)  Transfers: Assistive device utilized: Single Lofstrand RUE  Sit to stand: Modified independence Stand to sit: Modified independence Chair to chair: Modified independence Floor: Deferred  Stairs: Level of Assistance: CGA Stair Negotiation Technique: Alternating Pattern  with Bilateral Rails Number of Stairs: 4  Height of Stairs: 6"  Comments: Slow and labored ascending/descending. No overt LOB;  Gait: Gait pattern: decreased step length- Right, decreased step length- Left, trunk flexed, poor foot clearance- Right, and poor foot clearance- Left Distance walked: 150' over the course of  evaluation Assistive device utilized: Single Lofstrand in RUE Level of assistance: Modified independence Comments: Slow and labored ambulation. Decreased self selected speed  Functional Outcome Measures  Results Comments  BERG 40/56 Increased fall risk  DGI    FGA    TUG 22.2 seconds Increased fall risk  5TSTS Unable Increased fall risk  6 Minute Walk Test    10 Meter Gait Speed Self-selected: 20.5s = 0.49 m/s; Fastest: 18.0s = 0.56 m/s Below community ambulation speed  (Blank rows = not tested)   TODAY'S TREATMENT    SUBJECTIVE: Pt reports that he is feeling very weak in his legs today. No excessive soreness reported after the last therapy session. Ongoing chronic low back pain. No specific questions or concerns currently.   PAIN: Chronic low back pain.   Therapeutic Activity NuStep (seat 12, hands 13) L0-4 x 10 minutes for BLE strengthening and warm-up during interval history (5 minutes unbilled); Standing mini squats x 10; Forward 6" step-ups alternating LE x 10 BLE; Lateral 6" step-ups x 10 toward each side; Standing heel raises x 20; Forward high knee marches in // bars x multiple lengths; Resisted side stepping with red tband around knees x multiple lengths;   Not performed: Seated clams with manual resistance x 20 BLE; Seated adductor squeezes with manual resistance x 20 BLE; Sit to stand from regular height chair with Airex pad on seat and 6# overhead ball med ball lifts 2 x 10; Seated LAQ with 4# ankle weights x 15 BLE; Standing hip strengthening with 4# ankle weights: Hip flexion marches x 15 BLE; HS curls x 15 BLE; Hip abduction x 15 BLE; Hip extension x 15 BLE;    PATIENT EDUCATION:  Education details: Pt educated throughout session about proper posture and technique with exercises. Improved exercise technique, movement at target joints, use of target muscles after min to mod verbal, visual, tactile cues.  Person educated: Patient Education method:  Explanation, Verbal cues, and Handouts Education comprehension: verbalized understanding and returned demonstration   HOME EXERCISE PROGRAM:  Access Code: [email or web address] URL: [email or web address] Date: 05/31/2023 Prepared by: Crawford Dock  Exercises - Seated Long Arc Quad  - 1 x daily - 3-4 x weekly - 3 sets - 10 reps - 2s hold - Seated Hip Abduction with Resistance  -  1 x daily - 3-4 x weekly - 3 sets - 10 reps - 2s hold - Heel Raises with Counter Support  - 1 x daily - 3-4 x weekly - 3 sets - 10 reps - 2s hold - Mini Squat with Counter Support  - 1 x daily - 3-4 x weekly - 3 sets - 10 reps - Semi-Tandem Balance at [employer] Eyes Open  - 1 x daily - 7 x weekly - 2-3 sets - 10 reps - 10-30 hold   ASSESSMENT:  CLINICAL IMPRESSION: Progressed strengthening exercises during session today with patient. Due to BLE weakness/fatigue deferred ankle weights today. No HEP modifications at this time. Pt encouraged to follow-up as scheduled. Currently his LE weakness is the largest limitation but will incorporate balance exercises as well. He will benefit from PT services to address deficits in strength, balance, and mobility in order to return to full function at home and decrease his risk for falls.         OBJECTIVE IMPAIRMENTS: Abnormal gait, decreased balance, difficulty walking, decreased strength, impaired sensation, and pain.   ACTIVITY LIMITATIONS: bending, standing, squatting, stairs, transfers, and caring for others  PARTICIPATION LIMITATIONS: meal prep, cleaning, laundry, shopping, and community activity  PERSONAL FACTORS: Age, Past/current experiences, Time since onset of injury/illness/exacerbation, and 3+ comorbidities: generalized polyneuropathy, COPD, DM, NSTEMI, and spinal stenosis are also affecting patient's functional outcome.   REHAB POTENTIAL: Fair    CLINICAL DECISION MAKING: Unstable/unpredictable  EVALUATION COMPLEXITY: High   GOALS: Goals reviewed  with patient? No  SHORT TERM GOALS: Target date: 07/10/2023  Pt will be independent with HEP in order to improve strength and balance in order to decrease fall risk and improve function at home. Baseline:  Goal status: ONGOING   LONG TERM GOALS: Target date: 08/21/2023  Pt will improve ABC by at least 13% in order to demonstrate clinically significant improvement in balance confidence.  Baseline: 20%; 07/10/23: 30% Goal status: PARTIALLY MET  2.  Pt will improve BERG by at least 3 points in order to demonstrate clinically significant improvement in balance.   Baseline: 40/56; 07/10/23: 50/56 Goal status: ACHIEVED  3.  Pt will increase self-selected to at least 0.6 m/s in order to demonstrate clinically significant improvement in limited community ambulation.         Baseline: self-selected: 0.49 m/s; 07/10/23: 0.52 m/s Goal status: REVISED  4. Pt will be able to complete 5TSTS without UE support in order to demonstrate clinically significant improvement in LE strength      Baseline: Unable to perform sit to stand without heavy UE assist; 07/10/23: 11.4s Goal status: ACHIEVED  5. Pt will decrease TUG to below 14 seconds/decrease in order to demonstrate decreased fall risk.  Baseline: 22.2s; 07/10/23: 14.0s;  Goal status: PARTIALLY MET  PLAN: PT FREQUENCY: 2x/week  PT DURATION: 12 weeks  PLANNED INTERVENTIONS: Therapeutic exercises, Therapeutic activity, Neuromuscular re-education, Balance training, Gait training, Patient/Family education, Self Care, Joint mobilization, Joint manipulation, Vestibular training, Canalith repositioning, Orthotic/Fit training, DME instructions, Dry Needling, Electrical stimulation, Spinal manipulation, Spinal mobilization, Cryotherapy, Moist heat, Taping, Traction, Ultrasound, Ionotophoresis 4mg /ml Dexamethasone , Manual therapy, and Re-evaluation.  PLAN FOR NEXT SESSION: progress strengthening and introduce balance exercises, modify/progress HEP as  needed;    Donnielle Addison D Lorene Samaan PT, DPT, GCS  3:29 PM,07/19/23

## 2023-07-24 ENCOUNTER — Ambulatory Visit

## 2023-07-24 DIAGNOSIS — R262 Difficulty in walking, not elsewhere classified: Secondary | ICD-10-CM | POA: Diagnosis not present

## 2023-07-24 DIAGNOSIS — M6281 Muscle weakness (generalized): Secondary | ICD-10-CM | POA: Diagnosis not present

## 2023-07-24 DIAGNOSIS — R2681 Unsteadiness on feet: Secondary | ICD-10-CM | POA: Diagnosis not present

## 2023-07-24 NOTE — Therapy (Signed)
 OUTPATIENT PHYSICAL THERAPY BALANCE TREATMENT   Patient Name: Sean Reyes. MRN: [id number] DOB:04-Jan-1941, 83 y.o., male Today's Date: 07/24/2023  END OF SESSION:  PT End of Session - 07/24/23 1338     Visit Number 14    Number of Visits 25    Date for PT Re-Evaluation 08/21/23    Authorization Type eval: 05/29/23;    Authorization Time Period Medicare A&B 2025  VL: Based on [place]  No auth req    PT Start Time 1317    PT Stop Time 1400    PT Time Calculation (min) 43 min    Equipment Utilized During Treatment Gait belt    Activity Tolerance Patient tolerated treatment well;Patient limited by fatigue;Patient limited by pain    Behavior During Therapy WFL for tasks assessed/performed            Past Medical History:  Diagnosis Date   Anemia    Anxiety    Arthritis    Arthritis of neck    Atrial fibrillation (HCC)    Cataracts, bilateral    Complication of anesthesia    pt reports low BP's after surgery at [facility] and difficulty awakening   Depression    Diabetes (HCC)    dx 6-8 yrs ago   Dysrhythmia    a-fib   GERD (gastroesophageal reflux disease)    OCC TAKES ALKA SELTZER   History of kidney stones    10-15 yrs ago   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   Hyperlipidemia    Hypertension    Myocardial infarction (HCC) 12/2020   Nocturia    S/P ablation of atrial fibrillation    Ablative therapy   Sleep apnea    CPAP    Spinal stenosis    Tachycardia, unspecified    Past Surgical History:  Procedure Laterality Date   ABLATION     ANTERIOR LAT LUMBAR FUSION N/A 06/27/2017   Procedure: Anterior Lateral Lumbar Interbody  Fusion - Lumbar Two-Lumbar Three - Lumbar Three-Lumbar Four, Posterior Lumbar Interbody Fusion Lumbar Four- Five;  Surgeon: Gearl Keens, MD;  Location: [facility] OR;  Service: Neurosurgery;  Laterality: N/A;  Anterior Lateral Lumbar Interbody  Fusion - Lumbar Two-Lumbar Three - Lumbar Three-Lumbar Four, Posterior Lumbar Interbody  Fusion Lumbar Four- Five   BACK SURGERY     CARDIAC CATHETERIZATION     CARDIOVERSION N/A 08/29/2018   Procedure: CARDIOVERSION;  Surgeon: Michelle Aid, MD;  Location: [facility] ORS;  Service: Cardiovascular;  Laterality: N/A;   CARDIOVERSION N/A 09/24/2018   Procedure: CARDIOVERSION;  Surgeon: Michelle Aid, MD;  Location: [facility] ORS;  Service: Cardiovascular;  Laterality: N/A;   COLONOSCOPY WITH PROPOFOL  N/A 10/05/2015   Procedure: COLONOSCOPY WITH PROPOFOL ;  Surgeon: Deveron Fly, MD;  Location: [facility] ENDOSCOPY;  Service: Endoscopy;  Laterality: N/A;   COLONOSCOPY WITH PROPOFOL  N/A 11/01/2020   Procedure: COLONOSCOPY WITH PROPOFOL ;  Surgeon: Luke Salaam, MD;  Location: [facility] ENDOSCOPY;  Service: Gastroenterology;  Laterality: N/A;   CORONARY STENT INTERVENTION N/A 12/22/2020   Procedure: CORONARY STENT INTERVENTION;  Surgeon: Percival Brace, MD;  Location: [facility] INVASIVE CV LAB;  Service: Cardiovascular;  Laterality: N/A;   ESOPHAGOGASTRODUODENOSCOPY N/A 11/01/2020   Procedure: ESOPHAGOGASTRODUODENOSCOPY (EGD);  Surgeon: Luke Salaam, MD;  Location: [facility] ENDOSCOPY;  Service: Gastroenterology;  Laterality: N/A;   ESOPHAGOGASTRODUODENOSCOPY (EGD) WITH PROPOFOL  N/A 04/01/2018   Procedure: ESOPHAGOGASTRODUODENOSCOPY (EGD) WITH PROPOFOL ;  Surgeon: Deveron Fly, MD;  Location: [facility] ENDOSCOPY;  Service: Endoscopy;  Laterality: N/A;  ESOPHAGOGASTRODUODENOSCOPY (EGD) WITH PROPOFOL  N/A 06/01/2022   Procedure: ESOPHAGOGASTRODUODENOSCOPY (EGD) WITH PROPOFOL ;  Surgeon: Luke Salaam, MD;  Location: [facility] ENDOSCOPY;  Service: Gastroenterology;  Laterality: N/A;   EYE SURGERY     HERNIA REPAIR     JOINT REPLACEMENT Bilateral    hips  RT+  LEFT X2    LEFT HEART CATH AND CORONARY ANGIOGRAPHY N/A 12/22/2020   Procedure: LEFT HEART CATH AND CORONARY ANGIOGRAPHY;  Surgeon: Percival Brace, MD;  Location: [facility] INVASIVE CV LAB;  Service: Cardiovascular;  Laterality: N/A;   LEFT HEART CATH AND  CORONARY ANGIOGRAPHY Left 08/23/2021   Procedure: LEFT HEART CATH AND CORONARY ANGIOGRAPHY;  Surgeon: Michelle Aid, MD;  Location: [facility] INVASIVE CV LAB;  Service: Cardiovascular;  Laterality: Left;   LUMBAR LAMINECTOMY/DECOMPRESSION MICRODISCECTOMY Left 09/13/2016   Procedure: Microdiscectomy - Lumbar two-three,  Lumbar three- - left;  Surgeon: Gearl Keens, MD;  Location: [facility] OR;  Service: Neurosurgery;  Laterality: Left;   SPINAL CORD STIMULATOR INSERTION  07/08/2019   TONSILLECTOMY     Patient Active Problem List   Diagnosis Date Noted   Lip swelling 07/04/2023   Diabetes mellitus treated with oral medication (HCC) 04/05/2023   Subareolar mass of left breast 10/03/2022   COPD (chronic obstructive pulmonary disease) (HCC) 10/31/2021   Shortness of breath on exertion 08/04/2021   Diabetes mellitus with proteinuria (HCC) 04/04/2021   GERD without esophagitis 04/02/2021   Coronary artery disease 12/28/2020   History of non-ST elevation myocardial infarction (NSTEMI) 12/21/2020   Pain in right shin 10/25/2020   Peripheral vascular disease (HCC) 08/06/2020   Chronic pain syndrome 07/06/2020   Cervical facet joint syndrome 04/08/2020   Spinal cord stimulator status 12/11/2019   Elevated TSH 08/20/2019   CKD (chronic kidney disease) stage 3, GFR 30-59 ml/min (HCC) 01/19/2019   Acquired thrombophilia (HCC) 01/19/2019   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   Postlaminectomy syndrome, lumbar region 01/16/2019   History of fusion of lumbar spine (L2-L5) 01/16/2019   Chronic radicular lumbar pain 01/16/2019   HNP (herniated nucleus pulposus), lumbar 04/29/2018   Advanced care planning/counseling discussion 09/28/2016   Spinal stenosis, lumbar region, 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/13/2016   Hyperlipidemia associated with type 2 diabetes mellitus (HCC) 07/14/2015   Symptomatic anemia 06/30/2015   Benign prostatic hyperplasia without lower urinary tract symptoms 06/02/2015   OSA  (obstructive sleep apnea) 03/23/2015   Hypertension associated with diabetes (HCC) 09/28/2014   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HCC) 09/28/2014   H/O prior ablation treatment 10/19/2011   AF (paroxysmal atrial fibrillation) (HCC) 10/19/2011   PCP: Lemar Pyles, NP  REFERRING PROVIDER: Rosan Comfort, MD   REFERRING DIAG: Peripheral Neuropathy  RATIONALE FOR EVALUATION AND TREATMENT: Rehabilitation  THERAPY DIAG: Difficulty in walking, not elsewhere classified  Muscle weakness (generalized)  ONSET DATE: 01/25/23 (acute on chronic)  FOLLOW-UP APPT SCHEDULED WITH REFERRING PROVIDER: Yes   FROM INITIAL EVALUATION SUBJECTIVE:  SUBJECTIVE STATEMENT:  Imbalance and LE weakness;  PERTINENT HISTORY:  Lower extremity weakness in patient with 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 He reports weakness in both legs and numbness in toes post surgery in 2018. He experienced a fall on 01/25/2023 at home, resulting in a L hip injury. No residual pain in hip at this time. No additional falls since December. He reports progressive LE weakness impairing his functional ability at home. He is known to this clinic from prior episodes of care for similar concerns.  Prior history: 11/02/22 Patient reports to physical therapy today with a chief concern of muscle weakness and imbalance. Patient reports progressive worsening in balance and muscular strength. He states that he has loss of sensation in  his toes he reports they are "asleep". Patient reports pain in the anterior thigh and bilateral calves. He currently uses an loftstand crutch in his RUE. Lately patient has reported difficulty with dressing LE, performing fine motor movements with left hand. He denies falls, saddle parasthesia, nausea, vomitting, night sweats.    10/17/2022 Patient reported he is not doing well lately. He has been seeing Dr. Mont Antis. He reports his legs are so weak that he can hardly walk, so he has been using a crutch for the last 1-2 years. Reported numbness and loss of sensation in his toes. He also reported that recently his left hand has began to has some numbness to where it feels like it is asleep. Denies pain, loss of sensation, coldness, burning. Denies falls."  Imaging: NCS conducted on 11/12/2018: Abnormal study. 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 There is also preliminary evidence of a superimposed left lower lumbosacral polyradiculopathy, based on NCV and distal needle exam results. I cannot test lumbar paraspinals due to patient on Eliquis .    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 Yes. 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  Recent memory is intact.  Remote memory is intact.  Attention span and concentration are intact.  Expressive speech is intact.  Patient's fund of knowledge is  within normal limits for educational level.    Gross Musculoskeletal Assessment Tremor: None Bulk: Normal Tone: Normal  Posture: Forward head and rounded shoulders  AROM Deferred specific measurements. Functional motion intact;  LE MMT: MMT (out of 5) Right  Left   Hip flexion 4 4+  Hip extension    Hip abduction (seated) 3+ 3+  Hip adduction (seated 3+ 3+  Hip internal rotation    Hip external rotation    Knee flexion (seated) 4 4  Knee extension 4 4  Ankle dorsiflexion 4 4  Ankle plantarflexion    Ankle inversion    Ankle eversion    (* = pain; Blank rows = not tested)  Transfers: Assistive device utilized: Single Lofstrand RUE  Sit to stand: Modified independence Stand to sit: Modified independence Chair to chair: Modified independence Floor: Deferred  Stairs: Level of Assistance: CGA Stair Negotiation Technique: Alternating Pattern  with Bilateral Rails Number of Stairs: 4  Height of Stairs: 6"  Comments: Slow and labored ascending/descending. No overt LOB;  Gait: Gait pattern: decreased step length- Right, decreased step length- Left, trunk flexed, poor foot clearance- Right, and poor foot clearance- Left Distance walked: 150' over the course of  evaluation Assistive device utilized: Single Lofstrand in RUE Level of assistance: Modified independence Comments: Slow and labored ambulation. Decreased self selected speed  Functional Outcome Measures  Results Comments  BERG 40/56 Increased fall risk  DGI    FGA    TUG 22.2 seconds Increased fall risk  5TSTS Unable Increased fall risk  6 Minute Walk Test    10 Meter Gait Speed Self-selected: 20.5s = 0.49 m/s; Fastest: 18.0s = 0.56 m/s Below community ambulation speed  (Blank rows = not tested)   TODAY'S TREATMENT    SUBJECTIVE: Pt reports that he is feeling very weak in his legs today. No excessive soreness reported after the last therapy session. Ongoing chronic low back pain. No specific questions or concerns currently.   PAIN: Chronic low back pain.   Therapeutic Activity NuStep (seat 12, hands 13) L0-4 x 10 minutes for BLE strengthening and warm-up during interval history (5 minutes unbilled); Standing heel raises x 20;  Seated clams with manual resistance 2 x 15 BLE; Seated adductor squeezes with manual resistance 2 x 15 BLE; Sit to stand from regular height chair without UE support 2 x 10; Seated LAQ with 4# ankle weights x 15 BLE;  Standing hip strengthening with 4# ankle weights (AW): Hip flexion marches x 15 BLE; HS curls x 15 BLE; Hip abduction x 15 BLE; Hip extension x 15 BLE;  Side stepping with 4# AW x multiple lengths;    PATIENT EDUCATION:  Education details: Pt educated throughout session about proper posture and technique with exercises. Improved exercise technique, movement at target joints, use of target muscles after min to mod verbal, visual, tactile cues.  Person educated: Patient Education method: Explanation, Verbal cues, and Handouts Education comprehension: verbalized understanding and returned demonstration   HOME EXERCISE PROGRAM:  Access Code: [email or web address] URL: [email or web address] Date: 05/31/2023 Prepared by: Crawford Dock  Exercises - Seated Long Arc Quad  - 1 x daily - 3-4 x weekly - 3 sets - 10 reps - 2s hold - Seated Hip Abduction with Resistance  - 1 x daily - 3-4 x weekly - 3 sets - 10 reps - 2s hold - Heel Raises with Counter Support  - 1 x daily - 3-4 x weekly - 3 sets - 10 reps - 2s hold -  Mini Squat with Counter Support  - 1 x daily - 3-4 x weekly - 3 sets - 10 reps - Semi-Tandem Balance at [employer] Eyes Open  - 1 x daily - 7 x weekly - 2-3 sets - 10 reps - 10-30 hold   ASSESSMENT:  CLINICAL IMPRESSION: Progressed strengthening exercises during session today with patient. No HEP modifications at this time. Pt encouraged to follow-up as scheduled. Currently his LE weakness is the largest limitation but will incorporate balance exercises as well. He will benefit from PT services to address deficits in strength, balance, and mobility in order to return to full function at home and decrease his risk for falls.         OBJECTIVE IMPAIRMENTS: Abnormal gait, decreased balance, difficulty walking, decreased strength, impaired sensation, and pain.   ACTIVITY LIMITATIONS: bending, standing, squatting, stairs, transfers, and caring for others  PARTICIPATION LIMITATIONS: meal prep, cleaning, laundry, shopping, and community activity  PERSONAL FACTORS: Age, Past/current experiences, Time since onset of injury/illness/exacerbation, and 3+ comorbidities: generalized polyneuropathy, COPD, DM, NSTEMI, and spinal stenosis are also affecting patient's functional outcome.   REHAB POTENTIAL: Fair    CLINICAL DECISION MAKING: Unstable/unpredictable  EVALUATION COMPLEXITY: High   GOALS: Goals reviewed with patient? No  SHORT TERM GOALS: Target date: 07/10/2023  Pt will be independent with HEP in order to improve strength and balance in order to decrease fall risk and improve function at home. Baseline:  Goal status: ONGOING   LONG TERM GOALS: Target date: 08/21/2023  Pt will improve ABC by at least  13% in order to demonstrate clinically significant improvement in balance confidence.  Baseline: 20%; 07/10/23: 30% Goal status: PARTIALLY MET  2.  Pt will improve BERG by at least 3 points in order to demonstrate clinically significant improvement in balance.   Baseline: 40/56; 07/10/23: 50/56 Goal status: ACHIEVED  3.  Pt will increase self-selected to at least 0.6 m/s in order to demonstrate clinically significant improvement in limited community ambulation.         Baseline: self-selected: 0.49 m/s; 07/10/23: 0.52 m/s Goal status: REVISED  4. Pt will be able to complete 5TSTS without UE support in order to demonstrate clinically significant improvement in LE strength      Baseline: Unable to perform sit to stand without heavy UE assist; 07/10/23: 11.4s Goal status: ACHIEVED  5. Pt will decrease TUG to below 14 seconds/decrease in order to demonstrate decreased fall risk.  Baseline: 22.2s; 07/10/23: 14.0s;  Goal status: PARTIALLY MET  PLAN: PT FREQUENCY: 2x/week  PT DURATION: 12 weeks  PLANNED INTERVENTIONS: Therapeutic exercises, Therapeutic activity, Neuromuscular re-education, Balance training, Gait training, Patient/Family education, Self Care, Joint mobilization, Joint manipulation, Vestibular training, Canalith repositioning, Orthotic/Fit training, DME instructions, Dry Needling, Electrical stimulation, Spinal manipulation, Spinal mobilization, Cryotherapy, Moist heat, Taping, Traction, Ultrasound, Ionotophoresis 4mg /ml Dexamethasone , Manual therapy, and Re-evaluation.  PLAN FOR NEXT SESSION: progress strengthening and introduce balance exercises, modify/progress HEP as needed;    Montez Cuda D Celeste Candelas PT, DPT, GCS  5:58 PM,07/24/23

## 2023-07-25 DIAGNOSIS — G629 Polyneuropathy, unspecified: Secondary | ICD-10-CM | POA: Diagnosis not present

## 2023-07-25 DIAGNOSIS — Z79899 Other long term (current) drug therapy: Secondary | ICD-10-CM | POA: Diagnosis not present

## 2023-07-25 DIAGNOSIS — E1122 Type 2 diabetes mellitus with diabetic chronic kidney disease: Secondary | ICD-10-CM | POA: Diagnosis not present

## 2023-07-25 DIAGNOSIS — G5603 Carpal tunnel syndrome, bilateral upper limbs: Secondary | ICD-10-CM | POA: Diagnosis not present

## 2023-07-25 DIAGNOSIS — Z1331 Encounter for screening for depression: Secondary | ICD-10-CM | POA: Diagnosis not present

## 2023-07-25 DIAGNOSIS — R531 Weakness: Secondary | ICD-10-CM | POA: Diagnosis not present

## 2023-07-26 ENCOUNTER — Ambulatory Visit

## 2023-07-26 DIAGNOSIS — R2681 Unsteadiness on feet: Secondary | ICD-10-CM | POA: Diagnosis not present

## 2023-07-26 DIAGNOSIS — M6281 Muscle weakness (generalized): Secondary | ICD-10-CM

## 2023-07-26 DIAGNOSIS — R262 Difficulty in walking, not elsewhere classified: Secondary | ICD-10-CM

## 2023-07-26 NOTE — Therapy (Signed)
 OUTPATIENT PHYSICAL THERAPY BALANCE TREATMENT   Patient Name: Sean Reyes. MRN: [id number] DOB:[birth year], 83 y.o., male Today's Date: 07/26/2023  END OF SESSION:  PT End of Session - 07/26/23 1348     Visit Number 15    Number of Visits 25    Date for PT Re-Evaluation 08/21/23    Authorization Type eval: 05/29/23;    Authorization Time Period Medicare A&B 2025  VL: Based on [place]  No auth req    PT Start Time 1355    PT Stop Time 1442    PT Time Calculation (min) 47 min    Equipment Utilized During Treatment Gait belt    Activity Tolerance Patient tolerated treatment well;Patient limited by fatigue;Patient limited by pain    Behavior During Therapy WFL for tasks assessed/performed         Past Medical History:  Diagnosis Date   Anemia    Anxiety    Arthritis    Arthritis of neck    Atrial fibrillation (HCC)    Cataracts, bilateral    Complication of anesthesia    pt reports low BP's after surgery at [facility] and difficulty awakening   Depression    Diabetes (HCC)    dx 6-8 yrs ago   Dysrhythmia    a-fib   GERD (gastroesophageal reflux disease)    OCC TAKES ALKA SELTZER   History of kidney stones    10-15 yrs ago   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   Hyperlipidemia    Hypertension    Myocardial infarction (HCC) 12/2020   Nocturia    S/P ablation of atrial fibrillation    Ablative therapy   Sleep apnea    CPAP    Spinal stenosis    Tachycardia, unspecified    Past Surgical History:  Procedure Laterality Date   ABLATION     ANTERIOR LAT LUMBAR FUSION N/A 06/27/2017   Procedure: Anterior Lateral Lumbar Interbody  Fusion - Lumbar Two-Lumbar Three - Lumbar Three-Lumbar Four, Posterior Lumbar Interbody Fusion Lumbar Four- Five;  Surgeon: Gearl Keens, MD;  Location: [facility] OR;  Service: Neurosurgery;  Laterality: N/A;  Anterior Lateral Lumbar Interbody  Fusion - Lumbar Two-Lumbar Three - Lumbar Three-Lumbar Four, Posterior Lumbar Interbody Fusion  Lumbar Four- Five   BACK SURGERY     CARDIAC CATHETERIZATION     CARDIOVERSION N/A 08/29/2018   Procedure: CARDIOVERSION;  Surgeon: Michelle Aid, MD;  Location: [facility] ORS;  Service: Cardiovascular;  Laterality: N/A;   CARDIOVERSION N/A 09/24/2018   Procedure: CARDIOVERSION;  Surgeon: Michelle Aid, MD;  Location: [facility] ORS;  Service: Cardiovascular;  Laterality: N/A;   COLONOSCOPY WITH PROPOFOL  N/A 10/05/2015   Procedure: COLONOSCOPY WITH PROPOFOL ;  Surgeon: Deveron Fly, MD;  Location: [facility] ENDOSCOPY;  Service: Endoscopy;  Laterality: N/A;   COLONOSCOPY WITH PROPOFOL  N/A 11/01/2020   Procedure: COLONOSCOPY WITH PROPOFOL ;  Surgeon: Luke Salaam, MD;  Location: [facility] ENDOSCOPY;  Service: Gastroenterology;  Laterality: N/A;   CORONARY STENT INTERVENTION N/A 12/22/2020   Procedure: CORONARY STENT INTERVENTION;  Surgeon: Percival Brace, MD;  Location: [facility] INVASIVE CV LAB;  Service: Cardiovascular;  Laterality: N/A;   ESOPHAGOGASTRODUODENOSCOPY N/A 11/01/2020   Procedure: ESOPHAGOGASTRODUODENOSCOPY (EGD);  Surgeon: Luke Salaam, MD;  Location: [facility] ENDOSCOPY;  Service: Gastroenterology;  Laterality: N/A;   ESOPHAGOGASTRODUODENOSCOPY (EGD) WITH PROPOFOL  N/A 04/01/2018   Procedure: ESOPHAGOGASTRODUODENOSCOPY (EGD) WITH PROPOFOL ;  Surgeon: Deveron Fly, MD;  Location: [facility] ENDOSCOPY;  Service: Endoscopy;  Laterality: N/A;   ESOPHAGOGASTRODUODENOSCOPY (  EGD) WITH PROPOFOL  N/A 06/01/2022   Procedure: ESOPHAGOGASTRODUODENOSCOPY (EGD) WITH PROPOFOL ;  Surgeon: Luke Salaam, MD;  Location: [facility] ENDOSCOPY;  Service: Gastroenterology;  Laterality: N/A;   EYE SURGERY     HERNIA REPAIR     JOINT REPLACEMENT Bilateral    hips  RT+  LEFT X2    LEFT HEART CATH AND CORONARY ANGIOGRAPHY N/A 12/22/2020   Procedure: LEFT HEART CATH AND CORONARY ANGIOGRAPHY;  Surgeon: Percival Brace, MD;  Location: [facility] INVASIVE CV LAB;  Service: Cardiovascular;  Laterality: N/A;   LEFT HEART CATH AND  CORONARY ANGIOGRAPHY Left 08/23/2021   Procedure: LEFT HEART CATH AND CORONARY ANGIOGRAPHY;  Surgeon: Michelle Aid, MD;  Location: [facility] INVASIVE CV LAB;  Service: Cardiovascular;  Laterality: Left;   LUMBAR LAMINECTOMY/DECOMPRESSION MICRODISCECTOMY Left 09/13/2016   Procedure: Microdiscectomy - Lumbar two-three,  Lumbar three- - left;  Surgeon: Gearl Keens, MD;  Location: [facility] OR;  Service: Neurosurgery;  Laterality: Left;   SPINAL CORD STIMULATOR INSERTION  07/08/2019   TONSILLECTOMY     Patient Active Problem List   Diagnosis Date Noted   Lip swelling 07/04/2023   Diabetes mellitus treated with oral medication (HCC) 04/05/2023   Subareolar mass of left breast 10/03/2022   COPD (chronic obstructive pulmonary disease) (HCC) 10/31/2021   Shortness of breath on exertion 08/04/2021   Diabetes mellitus with proteinuria (HCC) 04/04/2021   GERD without esophagitis 04/02/2021   Coronary artery disease 12/28/2020   History of non-ST elevation myocardial infarction (NSTEMI) 12/21/2020   Pain in right shin 10/25/2020   Peripheral vascular disease (HCC) 08/06/2020   Chronic pain syndrome 07/06/2020   Cervical facet joint syndrome 04/08/2020   Spinal cord stimulator status 12/11/2019   Elevated TSH 08/20/2019   CKD (chronic kidney disease) stage 3, GFR 30-59 ml/min (HCC) 01/19/2019   Acquired thrombophilia (HCC) 01/19/2019   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   Postlaminectomy syndrome, lumbar region 01/16/2019   History of fusion of lumbar spine (L2-L5) 01/16/2019   Chronic radicular lumbar pain 01/16/2019   HNP (herniated nucleus pulposus), lumbar 04/29/2018   Advanced care planning/counseling discussion 09/28/2016   Spinal stenosis, lumbar region, 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/13/2016   Hyperlipidemia associated with type 2 diabetes mellitus (HCC) 07/14/2015   Symptomatic anemia 06/30/2015   Benign prostatic hyperplasia without lower urinary tract symptoms 06/02/2015   OSA  (obstructive sleep apnea) 03/23/2015   Hypertension associated with diabetes (HCC) 09/28/2014   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HCC) 09/28/2014   H/O prior ablation treatment 10/19/2011   AF (paroxysmal atrial fibrillation) (HCC) 10/19/2011   PCP: Lemar Pyles, NP  REFERRING PROVIDER: Rosan Comfort, MD   REFERRING DIAG: Peripheral Neuropathy  RATIONALE FOR EVALUATION AND TREATMENT: Rehabilitation  THERAPY DIAG: Difficulty in walking, not elsewhere classified  Muscle weakness (generalized)  ONSET DATE: 01/25/23 (acute on chronic)  FOLLOW-UP APPT SCHEDULED WITH REFERRING PROVIDER: Yes   FROM INITIAL EVALUATION SUBJECTIVE:  SUBJECTIVE STATEMENT:  Imbalance and LE weakness;  PERTINENT HISTORY:  Lower extremity weakness in patient with 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 He reports weakness in both legs and numbness in toes post surgery in 2018. He experienced a fall on 01/25/2023 at home, resulting in a L hip injury. No residual pain in hip at this time. No additional falls since December. He reports progressive LE weakness impairing his functional ability at home. He is known to this clinic from prior episodes of care for similar concerns.  Prior history: 11/02/22 Patient reports to physical therapy today with a chief concern of muscle weakness and imbalance. Patient reports progressive worsening in balance and muscular strength. He states that he has loss of sensation in  his toes he reports they are asleep. Patient reports pain in the anterior thigh and bilateral calves. He currently uses an loftstand crutch in his RUE. Lately patient has reported difficulty with dressing LE, performing fine motor movements with left hand. He denies falls, saddle parasthesia, nausea, vomitting, night sweats.    10/17/2022 Patient reported he is not doing well lately. He has been seeing Dr. Mont Antis. He reports his legs are so weak that he can hardly walk, so he has been using a crutch for the last 1-2 years. Reported numbness and loss of sensation in his toes. He also reported that recently his left hand has began to has some numbness to where it feels like it is asleep. Denies pain, loss of sensation, coldness, burning. Denies falls.  Imaging: NCS conducted on 11/12/2018: Abnormal study. 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 There is also preliminary evidence of a superimposed left lower lumbosacral polyradiculopathy, based on NCV and distal needle exam results. I cannot test lumbar paraspinals due to patient on Eliquis .    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 Yes. 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  Recent memory is intact.  Remote memory is intact.  Attention span and concentration are intact.  Expressive speech is intact.  Patient's fund of knowledge is  within normal limits for educational level.    Gross Musculoskeletal Assessment Tremor: None Bulk: Normal Tone: Normal  Posture: Forward head and rounded shoulders  AROM Deferred specific measurements. Functional motion intact;  LE MMT: MMT (out of 5) Right  Left   Hip flexion 4 4+  Hip extension    Hip abduction (seated) 3+ 3+  Hip adduction (seated 3+ 3+  Hip internal rotation    Hip external rotation    Knee flexion (seated) 4 4  Knee extension 4 4  Ankle dorsiflexion 4 4  Ankle plantarflexion    Ankle inversion    Ankle eversion    (* = pain; Blank rows = not tested)  Transfers: Assistive device utilized: Single Lofstrand RUE  Sit to stand: Modified independence Stand to sit: Modified independence Chair to chair: Modified independence Floor: Deferred  Stairs: Level of Assistance: CGA Stair Negotiation Technique: Alternating Pattern  with Bilateral Rails Number of Stairs: 4  Height of Stairs: 6  Comments: Slow and labored ascending/descending. No overt LOB;  Gait: Gait pattern: decreased step length- Right, decreased step length- Left, trunk flexed, poor foot clearance- Right, and poor foot clearance- Left Distance walked: 150' over the course of  evaluation Assistive device utilized: Single Lofstrand in RUE Level of assistance: Modified independence Comments: Slow and labored ambulation. Decreased self selected speed  Functional Outcome Measures  Results Comments  BERG 40/56 Increased fall risk  DGI    FGA    TUG 22.2 seconds Increased fall risk  5TSTS Unable Increased fall risk  6 Minute Walk Test    10 Meter Gait Speed Self-selected: 20.5s = 0.49 m/s; Fastest: 18.0s = 0.56 m/s Below community ambulation speed  (Blank rows = not tested)   TODAY'S TREATMENT    SUBJECTIVE: Pt reports ongoing BLE weakness. Mild soreness but not excessive after the last therapy session. Ongoing chronic low back pain. No specific questions or concerns currently.   PAIN: Chronic low back pain.   Therapeutic Activity NuStep (seat 12, hands 13) L0-4 x 10 minutes for BLE strengthening and warm-up during interval history (5 minutes unbilled); Forward 6 step-ups alternating leading LE x 10 each; Lateral 6 step-ups x 10 toward each side; Airex balance beam side stepping x multiple lengths; Airex balance beam tandem balance alternating forward LE x 30s each; Standing heel raises x 20;  Seated clams with manual resistance x 15 BLE; Seated adductor squeezes with manual resistance x 15 BLE; Sit to stand from regular height chair without UE support 2 x 10; Seated LAQ with 3# ankle weights x 15 BLE;  Standing hip strengthening with 3# ankle weights (AW): Hip flexion marches x 15 BLE; HS curls x 15 BLE; Hip abduction x 15 BLE; Hip extension x 15 BLE;  Side stepping with 3# AW x multiple lengths;   PATIENT EDUCATION:  Education details: Pt educated throughout session about proper posture and technique with exercises. Improved exercise technique, movement at target joints, use of target muscles after min to mod verbal, visual, tactile cues.  Person educated: Patient Education method: Explanation, Verbal cues, and Handouts Education  comprehension: verbalized understanding and returned demonstration   HOME EXERCISE PROGRAM:  Access Code: [email or web address] URL: [email or web address] Date: 05/31/2023 Prepared by: Crawford Dock  Exercises - Seated Long Arc Quad  - 1 x daily - 3-4 x weekly - 3 sets - 10 reps - 2s hold - Seated Hip Abduction with Resistance  - 1 x daily - 3-4 x weekly - 3 sets - 10 reps -  2s hold - Heel Raises with Counter Support  - 1 x daily - 3-4 x weekly - 3 sets - 10 reps - 2s hold - Mini Squat with Counter Support  - 1 x daily - 3-4 x weekly - 3 sets - 10 reps - Semi-Tandem Balance at [employer] Eyes Open  - 1 x daily - 7 x weekly - 2-3 sets - 10 reps - 10-30 hold   ASSESSMENT:  CLINICAL IMPRESSION: Progressed strengthening exercises during session today with patient. No HEP modifications at this time. Pt encouraged to follow-up as scheduled. Currently his LE weakness is the largest limitation in addition to his endurance impairments. He will benefit from PT services to address deficits in strength, balance, and mobility in order to return to full function at home and decrease his risk for falls.         OBJECTIVE IMPAIRMENTS: Abnormal gait, decreased balance, difficulty walking, decreased strength, impaired sensation, and pain.   ACTIVITY LIMITATIONS: bending, standing, squatting, stairs, transfers, and caring for others  PARTICIPATION LIMITATIONS: meal prep, cleaning, laundry, shopping, and community activity  PERSONAL FACTORS: Age, Past/current experiences, Time since onset of injury/illness/exacerbation, and 3+ comorbidities: generalized polyneuropathy, COPD, DM, NSTEMI, and spinal stenosis are also affecting patient's functional outcome.   REHAB POTENTIAL: Fair    CLINICAL DECISION MAKING: Unstable/unpredictable  EVALUATION COMPLEXITY: High   GOALS: Goals reviewed with patient? No  SHORT TERM GOALS: Target date: 07/10/2023  Pt will be independent with HEP in order to improve  strength and balance in order to decrease fall risk and improve function at home. Baseline:  Goal status: ONGOING   LONG TERM GOALS: Target date: 08/21/2023  Pt will improve ABC by at least 13% in order to demonstrate clinically significant improvement in balance confidence.  Baseline: 20%; 07/10/23: 30% Goal status: PARTIALLY MET  2.  Pt will improve BERG by at least 3 points in order to demonstrate clinically significant improvement in balance.   Baseline: 40/56; 07/10/23: 50/56 Goal status: ACHIEVED  3.  Pt will increase self-selected to at least 0.6 m/s in order to demonstrate clinically significant improvement in limited community ambulation.         Baseline: self-selected: 0.49 m/s; 07/10/23: 0.52 m/s Goal status: REVISED  4. Pt will be able to complete 5TSTS without UE support in order to demonstrate clinically significant improvement in LE strength      Baseline: Unable to perform sit to stand without heavy UE assist; 07/10/23: 11.4s Goal status: ACHIEVED  5. Pt will decrease TUG to below 14 seconds/decrease in order to demonstrate decreased fall risk.  Baseline: 22.2s; 07/10/23: 14.0s;  Goal status: PARTIALLY MET  PLAN: PT FREQUENCY: 2x/week  PT DURATION: 12 weeks  PLANNED INTERVENTIONS: Therapeutic exercises, Therapeutic activity, Neuromuscular re-education, Balance training, Gait training, Patient/Family education, Self Care, Joint mobilization, Joint manipulation, Vestibular training, Canalith repositioning, Orthotic/Fit training, DME instructions, Dry Needling, Electrical stimulation, Spinal manipulation, Spinal mobilization, Cryotherapy, Moist heat, Taping, Traction, Ultrasound, Ionotophoresis 4mg /ml Dexamethasone , Manual therapy, and Re-evaluation.  PLAN FOR NEXT SESSION: progress strengthening and introduce balance exercises, modify/progress HEP as needed;    Amair Shrout D Euna Armon PT, DPT, GCS  3:01 PM,07/26/23

## 2023-07-31 ENCOUNTER — Ambulatory Visit

## 2023-07-31 DIAGNOSIS — M6281 Muscle weakness (generalized): Secondary | ICD-10-CM | POA: Diagnosis not present

## 2023-07-31 DIAGNOSIS — R2681 Unsteadiness on feet: Secondary | ICD-10-CM | POA: Diagnosis not present

## 2023-07-31 DIAGNOSIS — R262 Difficulty in walking, not elsewhere classified: Secondary | ICD-10-CM | POA: Diagnosis not present

## 2023-07-31 NOTE — Therapy (Cosign Needed)
 OUTPATIENT PHYSICAL THERAPY BALANCE TREATMENT   Patient Name: Sean Reyes. MRN: [id number] DOB:13-May-1940, 83 y.o., male Today's Date: 07/31/2023  END OF SESSION:  PT End of Session - 07/31/23 1406     Visit Number 16    Number of Visits 25    Date for PT Re-Evaluation 08/21/23    Authorization Type eval: 05/29/23;    Authorization Time Period Medicare A&B 2025  VL: Based on [place]  No auth req    PT Start Time 1400    PT Stop Time 1447    PT Time Calculation (min) 47 min    Equipment Utilized During Treatment Gait belt    Activity Tolerance Patient tolerated treatment well;Patient limited by fatigue;Patient limited by pain    Behavior During Therapy WFL for tasks assessed/performed         Past Medical History:  Diagnosis Date   Anemia    Anxiety    Arthritis    Arthritis of neck    Atrial fibrillation (HCC)    Cataracts, bilateral    Complication of anesthesia    pt reports low BP's after surgery at [facility] and difficulty awakening   Depression    Diabetes (HCC)    dx 6-8 yrs ago   Dysrhythmia    a-fib   GERD (gastroesophageal reflux disease)    OCC TAKES ALKA SELTZER   History of kidney stones    10-15 yrs ago   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   Hyperlipidemia    Hypertension    Myocardial infarction (HCC) 12/2020   Nocturia    S/P ablation of atrial fibrillation    Ablative therapy   Sleep apnea    CPAP    Spinal stenosis    Tachycardia, unspecified    Past Surgical History:  Procedure Laterality Date   ABLATION     ANTERIOR LAT LUMBAR FUSION N/A 06/27/2017   Procedure: Anterior Lateral Lumbar Interbody  Fusion - Lumbar Two-Lumbar Three - Lumbar Three-Lumbar Four, Posterior Lumbar Interbody Fusion Lumbar Four- Five;  Surgeon: Gearl Keens, MD;  Location: [facility] OR;  Service: Neurosurgery;  Laterality: N/A;  Anterior Lateral Lumbar Interbody  Fusion - Lumbar Two-Lumbar Three - Lumbar Three-Lumbar Four, Posterior Lumbar Interbody Fusion  Lumbar Four- Five   BACK SURGERY     CARDIAC CATHETERIZATION     CARDIOVERSION N/A 08/29/2018   Procedure: CARDIOVERSION;  Surgeon: Michelle Aid, MD;  Location: [facility] ORS;  Service: Cardiovascular;  Laterality: N/A;   CARDIOVERSION N/A 09/24/2018   Procedure: CARDIOVERSION;  Surgeon: Michelle Aid, MD;  Location: [facility] ORS;  Service: Cardiovascular;  Laterality: N/A;   COLONOSCOPY WITH PROPOFOL  N/A 10/05/2015   Procedure: COLONOSCOPY WITH PROPOFOL ;  Surgeon: Deveron Fly, MD;  Location: [facility] ENDOSCOPY;  Service: Endoscopy;  Laterality: N/A;   COLONOSCOPY WITH PROPOFOL  N/A 11/01/2020   Procedure: COLONOSCOPY WITH PROPOFOL ;  Surgeon: Luke Salaam, MD;  Location: [facility] ENDOSCOPY;  Service: Gastroenterology;  Laterality: N/A;   CORONARY STENT INTERVENTION N/A 12/22/2020   Procedure: CORONARY STENT INTERVENTION;  Surgeon: Percival Brace, MD;  Location: [facility] INVASIVE CV LAB;  Service: Cardiovascular;  Laterality: N/A;   ESOPHAGOGASTRODUODENOSCOPY N/A 11/01/2020   Procedure: ESOPHAGOGASTRODUODENOSCOPY (EGD);  Surgeon: Luke Salaam, MD;  Location: [facility] ENDOSCOPY;  Service: Gastroenterology;  Laterality: N/A;   ESOPHAGOGASTRODUODENOSCOPY (EGD) WITH PROPOFOL  N/A 04/01/2018   Procedure: ESOPHAGOGASTRODUODENOSCOPY (EGD) WITH PROPOFOL ;  Surgeon: Deveron Fly, MD;  Location[facility] ENDOSCOPY;  Service: Endoscopy;  Laterality: N/A;   ESOPHAGOGASTRODUODENOSCOPY (  EGD) WITH PROPOFOL  N/A 06/01/2022   Procedure: ESOPHAGOGASTRODUODENOSCOPY (EGD) WITH PROPOFOL ;  Surgeon: Luke Salaam, MD;  Location: [facility] ENDOSCOPY;  Service: Gastroenterology;  Laterality: N/A;   EYE SURGERY     HERNIA REPAIR     JOINT REPLACEMENT Bilateral    hips  RT+  LEFT X2    LEFT HEART CATH AND CORONARY ANGIOGRAPHY N/A 12/22/2020   Procedure: LEFT HEART CATH AND CORONARY ANGIOGRAPHY;  Surgeon: Percival Brace, MD;  Location: [facility] INVASIVE CV LAB;  Service: Cardiovascular;  Laterality: N/A;   LEFT HEART CATH AND  CORONARY ANGIOGRAPHY Left 08/23/2021   Procedure: LEFT HEART CATH AND CORONARY ANGIOGRAPHY;  Surgeon: Michelle Aid, MD;  Location: [facility] INVASIVE CV LAB;  Service: Cardiovascular;  Laterality: Left;   LUMBAR LAMINECTOMY/DECOMPRESSION MICRODISCECTOMY Left 09/13/2016   Procedure: Microdiscectomy - Lumbar two-three,  Lumbar three- - left;  Surgeon: Gearl Keens, MD;  Location: [facility] OR;  Service: Neurosurgery;  Laterality: Left;   SPINAL CORD STIMULATOR INSERTION  07/08/2019   TONSILLECTOMY     Patient Active Problem List   Diagnosis Date Noted   Lip swelling 07/04/2023   Diabetes mellitus treated with oral medication (HCC) 04/05/2023   Subareolar mass of left breast 10/03/2022   COPD (chronic obstructive pulmonary disease) (HCC) 10/31/2021   Shortness of breath on exertion 08/04/2021   Diabetes mellitus with proteinuria (HCC) 04/04/2021   GERD without esophagitis 04/02/2021   Coronary artery disease 12/28/2020   History of non-ST elevation myocardial infarction (NSTEMI) 12/21/2020   Pain in right shin 10/25/2020   Peripheral vascular disease (HCC) 08/06/2020   Chronic pain syndrome 07/06/2020   Cervical facet joint syndrome 04/08/2020   Spinal cord stimulator status 12/11/2019   Elevated TSH 08/20/2019   CKD (chronic kidney disease) stage 3, GFR 30-59 ml/min (HCC) 01/19/2019   Acquired thrombophilia (HCC) 01/19/2019   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   Postlaminectomy syndrome, lumbar region 01/16/2019   History of fusion of lumbar spine (L2-L5) 01/16/2019   Chronic radicular lumbar pain 01/16/2019   HNP (herniated nucleus pulposus), lumbar 04/29/2018   Advanced care planning/counseling discussion 09/28/2016   Spinal stenosis, lumbar region, 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/13/2016   Hyperlipidemia associated with type 2 diabetes mellitus (HCC) 07/14/2015   Symptomatic anemia 06/30/2015   Benign prostatic hyperplasia without lower urinary tract symptoms 06/02/2015   OSA  (obstructive sleep apnea) 03/23/2015   Hypertension associated with diabetes (HCC) 09/28/2014   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HCC) 09/28/2014   H/O prior ablation treatment 10/19/2011   AF (paroxysmal atrial fibrillation) (HCC) 10/19/2011   PCP: Lemar Pyles, NP  REFERRING PROVIDER: Rosan Comfort, MD   REFERRING DIAG: Peripheral Neuropathy  RATIONALE FOR EVALUATION AND TREATMENT: Rehabilitation  THERAPY DIAG: Unsteadiness on feet  Difficulty in walking, not elsewhere classified  Muscle weakness (generalized)  ONSET DATE: 01/25/23 (acute on chronic)  FOLLOW-UP APPT SCHEDULED WITH REFERRING PROVIDER: Yes   FROM INITIAL EVALUATION SUBJECTIVE:  SUBJECTIVE STATEMENT:  Imbalance and LE weakness;  PERTINENT HISTORY:  Lower extremity weakness in patient with 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 He reports weakness in both legs and numbness in toes post surgery in 2018. He experienced a fall on 01/25/2023 at home, resulting in a L hip injury. No residual pain in hip at this time. No additional falls since December. He reports progressive LE weakness impairing his functional ability at home. He is known to this clinic from prior episodes of care for similar concerns.  Prior history: 11/02/22 Patient reports to physical therapy today with a chief concern of muscle weakness and imbalance. Patient reports progressive worsening in balance and muscular strength. He states that he  has loss of sensation in his toes he reports they are asleep. Patient reports pain in the anterior thigh and bilateral calves. He currently uses an loftstand crutch in his RUE. Lately patient has reported difficulty with dressing LE, performing fine motor movements with left hand. He denies falls, saddle parasthesia, nausea, vomitting, night sweats.    10/17/2022 Patient reported he is not doing well lately. He has been seeing Dr. Mont Antis. He reports his legs are so weak that he can hardly walk, so he has been using a crutch for the last 1-2 years. Reported numbness and loss of sensation in his toes. He also reported that recently his left hand has began to has some numbness to where it feels like it is asleep. Denies pain, loss of sensation, coldness, burning. Denies falls.  Imaging: NCS conducted on 11/12/2018: Abnormal study. 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 There is also preliminary evidence of a superimposed left lower lumbosacral polyradiculopathy, based on NCV and distal needle exam results. I cannot test lumbar paraspinals due to patient on Eliquis .    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 Yes. 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  Recent memory is intact.  Remote memory is intact.  Attention span and concentration are intact.  Expressive speech is intact.  Patient's fund of knowledge is  within normal limits for educational level.    Gross Musculoskeletal Assessment Tremor: None Bulk: Normal Tone: Normal  Posture: Forward head and rounded shoulders  AROM Deferred specific measurements. Functional motion intact;  LE MMT: MMT (out of 5) Right  Left   Hip flexion 4 4+  Hip extension    Hip abduction (seated) 3+ 3+  Hip adduction (seated 3+ 3+  Hip internal rotation    Hip external rotation    Knee flexion (seated) 4 4  Knee extension 4 4  Ankle dorsiflexion 4 4  Ankle plantarflexion    Ankle inversion    Ankle eversion    (* = pain; Blank rows = not tested)  Transfers: Assistive device utilized: Single Lofstrand RUE  Sit to stand: Modified independence Stand to sit: Modified independence Chair to chair: Modified independence Floor: Deferred  Stairs: Level of Assistance: CGA Stair Negotiation Technique: Alternating Pattern  with Bilateral Rails Number of Stairs: 4  Height of Stairs: 6  Comments: Slow and labored ascending/descending. No overt LOB;  Gait: Gait pattern: decreased step length- Right, decreased step length- Left, trunk flexed, poor foot clearance- Right, and poor foot clearance- Left Distance walked:  150' over the course of evaluation Assistive device utilized: Single Lofstrand in RUE Level of assistance: Modified independence Comments: Slow and labored ambulation. Decreased self selected speed  Functional Outcome Measures  Results Comments  BERG 40/56 Increased fall risk  DGI    FGA    TUG 22.2 seconds Increased fall risk  5TSTS Unable Increased fall risk  6 Minute Walk Test    10 Meter Gait Speed Self-selected: 20.5s = 0.49 m/s; Fastest: 18.0s = 0.56 m/s Below community ambulation speed  (Blank rows = not tested)   TODAY'S TREATMENT: 07/31/23    SUBJECTIVE: Pt reports ongoing BLE weakness, but reports weakness is worse on LLE at this visit. Mild soreness but not excessive after the last therapy session. Ongoing chronic low back pain. No specific questions or concerns currently.   PAIN: Chronic low back pain.   Therapeutic Activity NuStep (seat 12, hands 13) L0-4 x 10 minutes for BLE strengthening and warm-up during interval history (5 minutes unbilled); Forward 6 step-ups alternating leading LE x 10 each; Lateral 6 step-ups x 10 toward each side; Sit to stand from regular height chair without UE support and overhead press with 6# ball 2 x 10; Standing heel raises x 20; Standing hip strengthening with 3# ankle weights (AW): Hip flexion marches x 15 BLE; HS curls x 15 BLE; Hip abduction x 15 BLE; Hip extension x 15 BLE; Airex balance beam side stepping x multiple lengths; Airex balance beam tandem balance alternating forward LE x 30s each; Seated LAQ with 3# ankle weights x 15 BLE;   Not Performed: Side stepping with 3# AW x multiple lengths;   PATIENT EDUCATION:  Education details: Pt educated throughout session about proper posture and technique with exercises. Improved exercise technique, movement at target joints, use of target muscles after min to mod verbal, visual, tactile cues.  Person educated: Patient Education method: Explanation, Verbal cues, and  Handouts Education comprehension: verbalized understanding and returned demonstration   HOME EXERCISE PROGRAM:  Access Code: [email or web address] URL: [email or web address] Date: 05/31/2023 Prepared by: Crawford Dock  Exercises - Seated Long Arc Quad  - 1 x daily - 3-4 x weekly - 3 sets - 10 reps - 2s hold - Seated Hip Abduction with Resistance  - 1 x daily - 3-4 x weekly - 3 sets - 10  reps - 2s hold - Heel Raises with Counter Support  - 1 x daily - 3-4 x weekly - 3 sets - 10 reps - 2s hold - Mini Squat with Counter Support  - 1 x daily - 3-4 x weekly - 3 sets - 10 reps - Semi-Tandem Balance at [employer] Eyes Open  - 1 x daily - 7 x weekly - 2-3 sets - 10 reps - 10-30 hold   ASSESSMENT:  CLINICAL IMPRESSION: Progressed strengthening exercises during session today with patient. No HEP modifications at this time. Pt encouraged to follow-up as scheduled. Currently his LE weakness remains the largest limitation in addition to his endurance impairments. Pt presents with greater weakness in his LLE and shows increased difficulty with foot clearance this session. He will benefit from PT services to address deficits in strength, balance, and mobility in order to return to full function at home and decrease his risk for falls.         OBJECTIVE IMPAIRMENTS: Abnormal gait, decreased balance, difficulty walking, decreased strength, impaired sensation, and pain.   ACTIVITY LIMITATIONS: bending, standing, squatting, stairs, transfers, and caring for others  PARTICIPATION LIMITATIONS: meal prep, cleaning, laundry, shopping, and community activity  PERSONAL FACTORS: Age, Past/current experiences, Time since onset of injury/illness/exacerbation, and 3+ comorbidities: generalized polyneuropathy, COPD, DM, NSTEMI, and spinal stenosis are also affecting patient's functional outcome.   REHAB POTENTIAL: Fair    CLINICAL DECISION MAKING: Unstable/unpredictable  EVALUATION COMPLEXITY:  High   GOALS: Goals reviewed with patient? No  SHORT TERM GOALS: Target date: 07/10/2023  Pt will be independent with HEP in order to improve strength and balance in order to decrease fall risk and improve function at home. Baseline:  Goal status: ONGOING   LONG TERM GOALS: Target date: 08/21/2023  Pt will improve ABC by at least 13% in order to demonstrate clinically significant improvement in balance confidence.  Baseline: 20%; 07/10/23: 30% Goal status: PARTIALLY MET  2.  Pt will improve BERG by at least 3 points in order to demonstrate clinically significant improvement in balance.   Baseline: 40/56; 07/10/23: 50/56 Goal status: ACHIEVED  3.  Pt will increase self-selected to at least 0.6 m/s in order to demonstrate clinically significant improvement in limited community ambulation.         Baseline: self-selected: 0.49 m/s; 07/10/23: 0.52 m/s Goal status: REVISED  4. Pt will be able to complete 5TSTS without UE support in order to demonstrate clinically significant improvement in LE strength      Baseline: Unable to perform sit to stand without heavy UE assist; 07/10/23: 11.4s Goal status: ACHIEVED  5. Pt will decrease TUG to below 14 seconds/decrease in order to demonstrate decreased fall risk.  Baseline: 22.2s; 07/10/23: 14.0s;  Goal status: PARTIALLY MET  PLAN: PT FREQUENCY: 2x/week  PT DURATION: 12 weeks  PLANNED INTERVENTIONS: Therapeutic exercises, Therapeutic activity, Neuromuscular re-education, Balance training, Gait training, Patient/Family education, Self Care, Joint mobilization, Joint manipulation, Vestibular training, Canalith repositioning, Orthotic/Fit training, DME instructions, Dry Needling, Electrical stimulation, Spinal manipulation, Spinal mobilization, Cryotherapy, Moist heat, Taping, Traction, Ultrasound, Ionotophoresis 4mg /ml Dexamethasone , Manual therapy, and Re-evaluation.  PLAN FOR NEXT SESSION: progress strengthening and introduce balance  exercises, modify/progress HEP as needed;   Sheril Dines, SPT  Jason D Huprich PT, DPT, GCS  3:49 PM,07/31/23

## 2023-08-02 ENCOUNTER — Ambulatory Visit

## 2023-08-02 DIAGNOSIS — R2681 Unsteadiness on feet: Secondary | ICD-10-CM

## 2023-08-02 DIAGNOSIS — R262 Difficulty in walking, not elsewhere classified: Secondary | ICD-10-CM

## 2023-08-02 DIAGNOSIS — M6281 Muscle weakness (generalized): Secondary | ICD-10-CM

## 2023-08-02 NOTE — Therapy (Unsigned)
 OUTPATIENT PHYSICAL THERAPY BALANCE TREATMENT   Patient Name: Sean Reyes. MRN: [id number] DOB:Dec 30, 1940, 83 y.o., male Today's Date: 08/03/2023  END OF SESSION:  PT End of Session - 08/02/23 1414     Visit Number 17    Number of Visits 25    Date for PT Re-Evaluation 08/21/23    Authorization Type eval: 05/29/23;    Authorization Time Period Medicare A&B 2025  VL: Based on [place]  No auth req    PT Start Time 1402    PT Stop Time 1447    PT Time Calculation (min) 45 min    Equipment Utilized During Treatment Gait belt    Activity Tolerance Patient tolerated treatment well;Patient limited by fatigue;Patient limited by pain    Behavior During Therapy WFL for tasks assessed/performed         Past Medical History:  Diagnosis Date   Anemia    Anxiety    Arthritis    Arthritis of neck    Atrial fibrillation (HCC)    Cataracts, bilateral    Complication of anesthesia    pt reports low BP's after surgery at [facility] and difficulty awakening   Depression    Diabetes (HCC)    dx 6-8 yrs ago   Dysrhythmia    a-fib   GERD (gastroesophageal reflux disease)    OCC TAKES ALKA SELTZER   History of kidney stones    10-15 yrs ago   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   Hyperlipidemia    Hypertension    Myocardial infarction (HCC) 12/2020   Nocturia    S/P ablation of atrial fibrillation    Ablative therapy   Sleep apnea    CPAP    Spinal stenosis    Tachycardia, unspecified    Past Surgical History:  Procedure Laterality Date   ABLATION     ANTERIOR LAT LUMBAR FUSION N/A 06/27/2017   Procedure: Anterior Lateral Lumbar Interbody  Fusion - Lumbar Two-Lumbar Three - Lumbar Three-Lumbar Four, Posterior Lumbar Interbody Fusion Lumbar Four- Five;  Surgeon: Gearl Keens, MD;  Location: [facility] OR;  Service: Neurosurgery;  Laterality: N/A;  Anterior Lateral Lumbar Interbody  Fusion - Lumbar Two-Lumbar Three - Lumbar Three-Lumbar Four, Posterior Lumbar Interbody Fusion  Lumbar Four- Five   BACK SURGERY     CARDIAC CATHETERIZATION     CARDIOVERSION N/A 08/29/2018   Procedure: CARDIOVERSION;  Surgeon: Michelle Aid, MD;  Location: [facility] ORS;  Service: Cardiovascular;  Laterality: N/A;   CARDIOVERSION N/A 09/24/2018   Procedure: CARDIOVERSION;  Surgeon: Michelle Aid, MD;  Location: [facility] ORS;  Service: Cardiovascular;  Laterality: N/A;   COLONOSCOPY WITH PROPOFOL  N/A 10/05/2015   Procedure: COLONOSCOPY WITH PROPOFOL ;  Surgeon: Deveron Fly, MD;  Location: [facility] ENDOSCOPY;  Service: Endoscopy;  Laterality: N/A;   COLONOSCOPY WITH PROPOFOL  N/A 11/01/2020   Procedure: COLONOSCOPY WITH PROPOFOL ;  Surgeon: Luke Salaam, MD;  Location: [facility] ENDOSCOPY;  Service: Gastroenterology;  Laterality: N/A;   CORONARY STENT INTERVENTION N/A 12/22/2020   Procedure: CORONARY STENT INTERVENTION;  Surgeon: Percival Brace, MD;  Location: [facility] INVASIVE CV LAB;  Service: Cardiovascular;  Laterality: N/A;   ESOPHAGOGASTRODUODENOSCOPY N/A 11/01/2020   Procedure: ESOPHAGOGASTRODUODENOSCOPY (EGD);  Surgeon: Luke Salaam, MD;  Location: [facility] ENDOSCOPY;  Service: Gastroenterology;  Laterality: N/A;   ESOPHAGOGASTRODUODENOSCOPY (EGD) WITH PROPOFOL  N/A 04/01/2018   Procedure: ESOPHAGOGASTRODUODENOSCOPY (EGD) WITH PROPOFOL ;  Surgeon: Deveron Fly, MD;  Location: [facility] ENDOSCOPY;  Service: Endoscopy;  Laterality: N/A;   ESOPHAGOGASTRODUODENOSCOPY (  EGD) WITH PROPOFOL  N/A 06/01/2022   Procedure: ESOPHAGOGASTRODUODENOSCOPY (EGD) WITH PROPOFOL ;  Surgeon: Luke Salaam, MD;  Location: [facility] ENDOSCOPY;  Service: Gastroenterology;  Laterality: N/A;   EYE SURGERY     HERNIA REPAIR     JOINT REPLACEMENT Bilateral    hips  RT+  LEFT X2    LEFT HEART CATH AND CORONARY ANGIOGRAPHY N/A 12/22/2020   Procedure: LEFT HEART CATH AND CORONARY ANGIOGRAPHY;  Surgeon: Percival Brace, MD;  Location: [facility] INVASIVE CV LAB;  Service: Cardiovascular;  Laterality: N/A;   LEFT HEART CATH AND  CORONARY ANGIOGRAPHY Left 08/23/2021   Procedure: LEFT HEART CATH AND CORONARY ANGIOGRAPHY;  Surgeon: Michelle Aid, MD;  Location: [facility] INVASIVE CV LAB;  Service: Cardiovascular;  Laterality: Left;   LUMBAR LAMINECTOMY/DECOMPRESSION MICRODISCECTOMY Left 09/13/2016   Procedure: Microdiscectomy - Lumbar two-three,  Lumbar three- - left;  Surgeon: Gearl Keens, MD;  Location: [facility] OR;  Service: Neurosurgery;  Laterality: Left;   SPINAL CORD STIMULATOR INSERTION  07/08/2019   TONSILLECTOMY     Patient Active Problem List   Diagnosis Date Noted   Lip swelling 07/04/2023   Diabetes mellitus treated with oral medication (HCC) 04/05/2023   Subareolar mass of left breast 10/03/2022   COPD (chronic obstructive pulmonary disease) (HCC) 10/31/2021   Shortness of breath on exertion 08/04/2021   Diabetes mellitus with proteinuria (HCC) 04/04/2021   GERD without esophagitis 04/02/2021   Coronary artery disease 12/28/2020   History of non-ST elevation myocardial infarction (NSTEMI) 12/21/2020   Pain in right shin 10/25/2020   Peripheral vascular disease (HCC) 08/06/2020   Chronic pain syndrome 07/06/2020   Cervical facet joint syndrome 04/08/2020   Spinal cord stimulator status 12/11/2019   Elevated TSH 08/20/2019   CKD (chronic kidney disease) stage 3, GFR 30-59 ml/min (HCC) 01/19/2019   Acquired thrombophilia (HCC) 01/19/2019   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   Postlaminectomy syndrome, lumbar region 01/16/2019   History of fusion of lumbar spine (L2-L5) 01/16/2019   Chronic radicular lumbar pain 01/16/2019   HNP (herniated nucleus pulposus), lumbar 04/29/2018   Advanced care planning/counseling discussion 09/28/2016   Spinal stenosis, lumbar region, 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/13/2016   Hyperlipidemia associated with type 2 diabetes mellitus (HCC) 07/14/2015   Symptomatic anemia 06/30/2015   Benign prostatic hyperplasia without lower urinary tract symptoms 06/02/2015   OSA  (obstructive sleep apnea) 03/23/2015   Hypertension associated with diabetes (HCC) 09/28/2014   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HCC) 09/28/2014   H/O prior ablation treatment 10/19/2011   AF (paroxysmal atrial fibrillation) (HCC) 10/19/2011   PCP: Lemar Pyles, NP  REFERRING PROVIDER: Rosan Comfort, MD   REFERRING DIAG: Peripheral Neuropathy  RATIONALE FOR EVALUATION AND TREATMENT: Rehabilitation  THERAPY DIAG: Difficulty in walking, not elsewhere classified  Unsteadiness on feet  Muscle weakness (generalized)  ONSET DATE: 01/25/23 (acute on chronic)  FOLLOW-UP APPT SCHEDULED WITH REFERRING PROVIDER: Yes   FROM INITIAL EVALUATION SUBJECTIVE:  SUBJECTIVE STATEMENT:  Imbalance and LE weakness;  PERTINENT HISTORY:  Lower extremity weakness in patient with 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 He reports weakness in both legs and numbness in toes post surgery in 2018. He experienced a fall on 01/25/2023 at home, resulting in a L hip injury. No residual pain in hip at this time. No additional falls since December. He reports progressive LE weakness impairing his functional ability at home. He is known to this clinic from prior episodes of care for similar concerns.  Prior history: 11/02/22 Patient reports to physical therapy today with a chief concern of muscle weakness and imbalance. Patient reports progressive worsening in balance and muscular strength. He states that he  has loss of sensation in his toes he reports they are asleep. Patient reports pain in the anterior thigh and bilateral calves. He currently uses an loftstand crutch in his RUE. Lately patient has reported difficulty with dressing LE, performing fine motor movements with left hand. He denies falls, saddle parasthesia, nausea, vomitting, night sweats.    10/17/2022 Patient reported he is not doing well lately. He has been seeing Dr. Mont Antis. He reports his legs are so weak that he can hardly walk, so he has been using a crutch for the last 1-2 years. Reported numbness and loss of sensation in his toes. He also reported that recently his left hand has began to has some numbness to where it feels like it is asleep. Denies pain, loss of sensation, coldness, burning. Denies falls.  Imaging: NCS conducted on 11/12/2018: Abnormal study. 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 There is also preliminary evidence of a superimposed left lower lumbosacral polyradiculopathy, based on NCV and distal needle exam results. I cannot test lumbar paraspinals due to patient on Eliquis .    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 Yes. 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  Recent memory is intact.  Remote memory is intact.  Attention span and concentration are intact.  Expressive speech is intact.  Patient's fund of knowledge is  within normal limits for educational level.    Gross Musculoskeletal Assessment Tremor: None Bulk: Normal Tone: Normal  Posture: Forward head and rounded shoulders  AROM Deferred specific measurements. Functional motion intact;  LE MMT: MMT (out of 5) Right  Left   Hip flexion 4 4+  Hip extension    Hip abduction (seated) 3+ 3+  Hip adduction (seated 3+ 3+  Hip internal rotation    Hip external rotation    Knee flexion (seated) 4 4  Knee extension 4 4  Ankle dorsiflexion 4 4  Ankle plantarflexion    Ankle inversion    Ankle eversion    (* = pain; Blank rows = not tested)  Transfers: Assistive device utilized: Single Lofstrand RUE  Sit to stand: Modified independence Stand to sit: Modified independence Chair to chair: Modified independence Floor: Deferred  Stairs: Level of Assistance: CGA Stair Negotiation Technique: Alternating Pattern  with Bilateral Rails Number of Stairs: 4  Height of Stairs: 6  Comments: Slow and labored ascending/descending. No overt LOB;  Gait: Gait pattern: decreased step length- Right, decreased step length- Left, trunk flexed, poor foot clearance- Right, and poor foot clearance- Left Distance walked:  150' over the course of evaluation Assistive device utilized: Single Lofstrand in RUE Level of assistance: Modified independence Comments: Slow and labored ambulation. Decreased self selected speed  Functional Outcome Measures  Results Comments  BERG 40/56 Increased fall risk  DGI    FGA    TUG 22.2 seconds Increased fall risk  5TSTS Unable Increased fall risk  6 Minute Walk Test    10 Meter Gait Speed Self-selected: 20.5s = 0.49 m/s; Fastest: 18.0s = 0.56 m/s Below community ambulation speed  (Blank rows = not tested)   TODAY'S TREATMENT: 07/31/23    SUBJECTIVE: Pt reports ongoing BLE weakness, but reports weakness is worse on LLE at this visit. Mild soreness but not excessive after the last therapy session. Ongoing chronic low back pain. Pt reporting intense sharp/achey pain in his shin which is secondary to a chronic injury from 83 y/o. No specific questions or concerns currently.   PAIN: Chronic low back pain. Sharp R shin pain.    Therapeutic Activity NuStep (seat 12, hands 13) L0-4 x 10 minutes for BLE strengthening and warm-up during interval history (5 minutes unbilled); Forward 6 step-ups alternating leading LE x 15 each; Lateral 6 step-ups x 15 toward each side; Sit to stand from regular height chair without UE support and overhead press with 6# ball 2 x 10; Standing heel raises x 20;  Standing hip strengthening with 3# ankle weights (AW): Hip flexion marches x 15 BLE; HS curls x 15 BLE; Hip abduction x 15 BLE; Hip extension x 15 BLE; Airex balance beam side stepping x multiple lengths;  Airex balance beam tandem balance alternating forward LE x 30s each; Seated LAQ with 3# ankle weights x 15 BLE;   Not Performed: Side stepping with 3# AW x multiple lengths;   PATIENT EDUCATION:  Education details: Pt educated throughout session about proper posture and technique with exercises. Improved exercise technique, movement at target joints, use of target muscles  after min to mod verbal, visual, tactile cues.  Person educated: Patient Education method: Explanation, Verbal cues, and Handouts Education comprehension: verbalized understanding and returned demonstration   HOME EXERCISE PROGRAM:  Access Code: [email or web address] URL: [email or web address] Date: 05/31/2023 Prepared by: Crawford Dock  Exercises - Seated Long Arc Quad  - 1 x daily - 3-4 x weekly - 3 sets -  10 reps - 2s hold - Seated Hip Abduction with Resistance  - 1 x daily - 3-4 x weekly - 3 sets - 10 reps - 2s hold - Heel Raises with Counter Support  - 1 x daily - 3-4 x weekly - 3 sets - 10 reps - 2s hold - Mini Squat with Counter Support  - 1 x daily - 3-4 x weekly - 3 sets - 10 reps - Semi-Tandem Balance at [employer] Eyes Open  - 1 x daily - 7 x weekly - 2-3 sets - 10 reps - 10-30 hold   ASSESSMENT:  CLINICAL IMPRESSION: Worked on strengthening exercises during session today with patient. Pt demonstrated increased BLE weakness and increased difficulty with foot clearance this session, most likely secondary to his increased R shin pain. No HEP modifications at this time. Pt encouraged to follow-up as scheduled. He will benefit from PT services to address deficits in strength, balance, and mobility in order to return to full function at home and decrease his risk for falls.         OBJECTIVE IMPAIRMENTS: Abnormal gait, decreased balance, difficulty walking, decreased strength, impaired sensation, and pain.   ACTIVITY LIMITATIONS: bending, standing, squatting, stairs, transfers, and caring for others  PARTICIPATION LIMITATIONS: meal prep, cleaning, laundry, shopping, and community activity  PERSONAL FACTORS: Age, Past/current experiences, Time since onset of injury/illness/exacerbation, and 3+ comorbidities: generalized polyneuropathy, COPD, DM, NSTEMI, and spinal stenosis are also affecting patient's functional outcome.   REHAB POTENTIAL: Fair    CLINICAL DECISION MAKING:  Unstable/unpredictable  EVALUATION COMPLEXITY: High   GOALS: Goals reviewed with patient? No  SHORT TERM GOALS: Target date: 07/10/2023  Pt will be independent with HEP in order to improve strength and balance in order to decrease fall risk and improve function at home. Baseline:  Goal status: ONGOING   LONG TERM GOALS: Target date: 08/21/2023  Pt will improve ABC by at least 13% in order to demonstrate clinically significant improvement in balance confidence.  Baseline: 20%; 07/10/23: 30% Goal status: PARTIALLY MET  2.  Pt will improve BERG by at least 3 points in order to demonstrate clinically significant improvement in balance.   Baseline: 40/56; 07/10/23: 50/56 Goal status: ACHIEVED  3.  Pt will increase self-selected to at least 0.6 m/s in order to demonstrate clinically significant improvement in limited community ambulation.         Baseline: self-selected: 0.49 m/s; 07/10/23: 0.52 m/s Goal status: REVISED  4. Pt will be able to complete 5TSTS without UE support in order to demonstrate clinically significant improvement in LE strength      Baseline: Unable to perform sit to stand without heavy UE assist; 07/10/23: 11.4s Goal status: ACHIEVED  5. Pt will decrease TUG to below 14 seconds/decrease in order to demonstrate decreased fall risk.  Baseline: 22.2s; 07/10/23: 14.0s;  Goal status: PARTIALLY MET  PLAN: PT FREQUENCY: 2x/week  PT DURATION: 12 weeks  PLANNED INTERVENTIONS: Therapeutic exercises, Therapeutic activity, Neuromuscular re-education, Balance training, Gait training, Patient/Family education, Self Care, Joint mobilization, Joint manipulation, Vestibular training, Canalith repositioning, Orthotic/Fit training, DME instructions, Dry Needling, Electrical stimulation, Spinal manipulation, Spinal mobilization, Cryotherapy, Moist heat, Taping, Traction, Ultrasound, Ionotophoresis 4mg /ml Dexamethasone , Manual therapy, and Re-evaluation.  PLAN FOR NEXT SESSION:  progress strengthening and introduce balance exercises, modify/progress HEP as needed;   Sheril Dines, SPT  Jason D Huprich PT, DPT, GCS  9:44 AM,08/03/23

## 2023-08-07 ENCOUNTER — Ambulatory Visit

## 2023-08-07 DIAGNOSIS — R262 Difficulty in walking, not elsewhere classified: Secondary | ICD-10-CM | POA: Diagnosis not present

## 2023-08-07 DIAGNOSIS — R2681 Unsteadiness on feet: Secondary | ICD-10-CM

## 2023-08-07 DIAGNOSIS — M6281 Muscle weakness (generalized): Secondary | ICD-10-CM | POA: Diagnosis not present

## 2023-08-07 NOTE — Therapy (Unsigned)
 OUTPATIENT PHYSICAL THERAPY BALANCE TREATMENT   Patient Name: Sean Reyes. MRN: [id number] DOB:01-Feb-1941, 83 y.o., male Today's Date: 08/08/2023  END OF SESSION:  PT End of Session - 08/07/23 1411     Visit Number 18    Number of Visits 25    Date for PT Re-Evaluation 08/21/23    Authorization Type eval: 05/29/23;    Authorization Time Period Medicare A&B 2025  VL: Based on [place]  No auth req    PT Start Time 1400    PT Stop Time 1445    PT Time Calculation (min) 45 min    Equipment Utilized During Treatment Gait belt    Activity Tolerance Patient tolerated treatment well    Behavior During Therapy WFL for tasks assessed/performed         Past Medical History:  Diagnosis Date   Anemia    Anxiety    Arthritis    Arthritis of neck    Atrial fibrillation (HCC)    Cataracts, bilateral    Complication of anesthesia    pt reports low BP's after surgery at [facility] and difficulty awakening   Depression    Diabetes (HCC)    dx 6-8 yrs ago   Dysrhythmia    a-fib   GERD (gastroesophageal reflux disease)    OCC TAKES ALKA SELTZER   History of kidney stones    10-15 yrs ago   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   Hyperlipidemia    Hypertension    Myocardial infarction (HCC) 12/2020   Nocturia    S/P ablation of atrial fibrillation    Ablative therapy   Sleep apnea    CPAP    Spinal stenosis    Tachycardia, unspecified    Past Surgical History:  Procedure Laterality Date   ABLATION     ANTERIOR LAT LUMBAR FUSION N/A 06/27/2017   Procedure: Anterior Lateral Lumbar Interbody  Fusion - Lumbar Two-Lumbar Three - Lumbar Three-Lumbar Four, Posterior Lumbar Interbody Fusion Lumbar Four- Five;  Surgeon: Onetha Kuba, MD;  Location: [facility] OR;  Service: Neurosurgery;  Laterality: N/A;  Anterior Lateral Lumbar Interbody  Fusion - Lumbar Two-Lumbar Three - Lumbar Three-Lumbar Four, Posterior Lumbar Interbody Fusion Lumbar Four- Five   BACK SURGERY     CARDIAC  CATHETERIZATION     CARDIOVERSION N/A 08/29/2018   Procedure: CARDIOVERSION;  Surgeon: Hester Wolm PARAS, MD;  Location: [facility] ORS;  Service: Cardiovascular;  Laterality: N/A;   CARDIOVERSION N/A 09/24/2018   Procedure: CARDIOVERSION;  Surgeon: Hester Wolm PARAS, MD;  Location: [facility] ORS;  Service: Cardiovascular;  Laterality: N/A;   COLONOSCOPY WITH PROPOFOL  N/A 10/05/2015   Procedure: COLONOSCOPY WITH PROPOFOL ;  Surgeon: Gladis RAYMOND Mariner, MD;  Location: [facility] ENDOSCOPY;  Service: Endoscopy;  Laterality: N/A;   COLONOSCOPY WITH PROPOFOL  N/A 11/01/2020   Procedure: COLONOSCOPY WITH PROPOFOL ;  Surgeon: Therisa Bi, MD;  Location: [facility] ENDOSCOPY;  Service: Gastroenterology;  Laterality: N/A;   CORONARY STENT INTERVENTION N/A 12/22/2020   Procedure: CORONARY STENT INTERVENTION;  Surgeon: Ammon Blunt, MD;  Location: [facility] INVASIVE CV LAB;  Service: Cardiovascular;  Laterality: N/A;   ESOPHAGOGASTRODUODENOSCOPY N/A 11/01/2020   Procedure: ESOPHAGOGASTRODUODENOSCOPY (EGD);  Surgeon: Therisa Bi, MD;  Location: [facility] ENDOSCOPY;  Service: Gastroenterology;  Laterality: N/A;   ESOPHAGOGASTRODUODENOSCOPY (EGD) WITH PROPOFOL  N/A 04/01/2018   Procedure: ESOPHAGOGASTRODUODENOSCOPY (EGD) WITH PROPOFOL ;  Surgeon: Mariner Gladis RAYMOND, MD;  Location: [facility] ENDOSCOPY;  Service: Endoscopy;  Laterality: N/A;   ESOPHAGOGASTRODUODENOSCOPY (EGD) WITH PROPOFOL  N/A 06/01/2022  Procedure: ESOPHAGOGASTRODUODENOSCOPY (EGD) WITH PROPOFOL ;  Surgeon: Therisa Bi, MD;  Location: [facility] ENDOSCOPY;  Service: Gastroenterology;  Laterality: N/A;   EYE SURGERY     HERNIA REPAIR     JOINT REPLACEMENT Bilateral    hips  RT+  LEFT X2    LEFT HEART CATH AND CORONARY ANGIOGRAPHY N/A 12/22/2020   Procedure: LEFT HEART CATH AND CORONARY ANGIOGRAPHY;  Surgeon: Ammon Blunt, MD;  Location: [facility] INVASIVE CV LAB;  Service: Cardiovascular;  Laterality: N/A;   LEFT HEART CATH AND CORONARY ANGIOGRAPHY Left 08/23/2021   Procedure:  LEFT HEART CATH AND CORONARY ANGIOGRAPHY;  Surgeon: Hester Wolm PARAS, MD;  Location: [facility] INVASIVE CV LAB;  Service: Cardiovascular;  Laterality: Left;   LUMBAR LAMINECTOMY/DECOMPRESSION MICRODISCECTOMY Left 09/13/2016   Procedure: Microdiscectomy - Lumbar two-three,  Lumbar three- - left;  Surgeon: Onetha Kuba, MD;  Location: [facility] OR;  Service: Neurosurgery;  Laterality: Left;   SPINAL CORD STIMULATOR INSERTION  07/08/2019   TONSILLECTOMY     Patient Active Problem List   Diagnosis Date Noted   Lip swelling 07/04/2023   Diabetes mellitus treated with oral medication (HCC) 04/05/2023   Subareolar mass of left breast 10/03/2022   COPD (chronic obstructive pulmonary disease) (HCC) 10/31/2021   Shortness of breath on exertion 08/04/2021   Diabetes mellitus with proteinuria (HCC) 04/04/2021   GERD without esophagitis 04/02/2021   Coronary artery disease 12/28/2020   History of non-ST elevation myocardial infarction (NSTEMI) 12/21/2020   Pain in right shin 10/25/2020   Peripheral vascular disease (HCC) 08/06/2020   Chronic pain syndrome 07/06/2020   Cervical facet joint syndrome 04/08/2020   Spinal cord stimulator status 12/11/2019   Elevated TSH 08/20/2019   CKD (chronic kidney disease) stage 3, GFR 30-59 ml/min (HCC) 01/19/2019   Acquired thrombophilia (HCC) 01/19/2019   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   Postlaminectomy syndrome, lumbar region 01/16/2019   History of fusion of lumbar spine (L2-L5) 01/16/2019   Chronic radicular lumbar pain 01/16/2019   HNP (herniated nucleus pulposus), lumbar 04/29/2018   Advanced care planning/counseling discussion 09/28/2016   Spinal stenosis, lumbar region, 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/13/2016   Hyperlipidemia associated with type 2 diabetes mellitus (HCC) 07/14/2015   Symptomatic anemia 06/30/2015   Benign prostatic hyperplasia without lower urinary tract symptoms 06/02/2015   OSA (obstructive sleep apnea) 03/23/2015   Hypertension  associated with diabetes (HCC) 09/28/2014   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HCC) 09/28/2014   H/O prior ablation treatment 10/19/2011   AF (paroxysmal atrial fibrillation) (HCC) 10/19/2011   PCP: Valerio Melanie DASEN, NP  REFERRING PROVIDER: Maree Jannett POUR, MD   REFERRING DIAG: Peripheral Neuropathy  RATIONALE FOR EVALUATION AND TREATMENT: Rehabilitation  THERAPY DIAG: Difficulty in walking, not elsewhere classified  Unsteadiness on feet  Muscle weakness (generalized)  ONSET DATE: 01/25/23 (acute on chronic)  FOLLOW-UP APPT SCHEDULED WITH REFERRING PROVIDER: Yes   FROM INITIAL EVALUATION SUBJECTIVE:  SUBJECTIVE STATEMENT:  Imbalance and LE weakness;  PERTINENT HISTORY:  Lower extremity weakness in patient with 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 He reports weakness in both legs and numbness in toes post surgery in 2018. He experienced a fall on 01/25/2023 at home, resulting in a L hip injury. No residual pain in hip at this time. No additional falls since December. He reports progressive LE weakness impairing his functional ability at home. He is known to this clinic from prior episodes of care for similar concerns.  Prior history: 11/02/22 Patient reports to physical therapy today with a chief concern of muscle weakness and imbalance. Patient reports progressive worsening in balance and muscular strength. He states that he has loss of sensation in his toes he reports they are  asleep. Patient reports pain in the anterior thigh and bilateral calves. He currently uses an loftstand crutch in his RUE. Lately patient has reported difficulty with dressing LE, performing fine motor movements with left hand. He denies falls, saddle parasthesia, nausea, vomitting, night sweats.    10/17/2022 Patient reported he is not doing well lately. He has been seeing Dr. Clois. He reports his legs are so weak that he can hardly walk, so he has been using a crutch for the last 1-2 years. Reported numbness and loss of sensation in his toes. He also reported that recently his left hand has began to has some numbness to where it feels like it is asleep. Denies pain, loss of sensation, coldness, burning. Denies falls.  Imaging: NCS conducted on 11/12/2018: Abnormal study. 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 There is also preliminary evidence of a superimposed left lower lumbosacral polyradiculopathy, based on NCV and distal needle exam results. I cannot test lumbar paraspinals due to patient on Eliquis .    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 Yes. 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  Recent memory is intact.  Remote memory is intact.  Attention span and concentration are intact.  Expressive speech is intact.  Patient's fund of knowledge is  within normal limits for educational level.    Gross Musculoskeletal Assessment Tremor: None Bulk: Normal Tone: Normal  Posture: Forward head and rounded shoulders  AROM Deferred specific measurements. Functional motion intact;  LE MMT: MMT (out of 5) Right  Left   Hip flexion 4 4+  Hip extension    Hip abduction (seated) 3+ 3+  Hip adduction (seated 3+ 3+  Hip internal rotation    Hip external rotation    Knee flexion (seated) 4 4  Knee extension 4 4  Ankle dorsiflexion 4 4  Ankle plantarflexion    Ankle inversion    Ankle eversion    (* = pain; Blank rows = not tested)  Transfers: Assistive device utilized: Single Lofstrand RUE  Sit to stand: Modified independence Stand to sit: Modified independence Chair to chair: Modified independence Floor: Deferred  Stairs: Level of Assistance: CGA Stair Negotiation Technique: Alternating Pattern  with Bilateral Rails Number of Stairs: 4  Height of Stairs: 6  Comments: Slow and labored ascending/descending. No overt LOB;  Gait: Gait pattern: decreased step length- Right, decreased step length- Left, trunk flexed, poor foot clearance- Right, and poor foot clearance- Left Distance walked: 150' over the course of evaluation Assistive device utilized:  Single Lofstrand in RUE Level of assistance: Modified independence Comments: Slow and labored ambulation. Decreased self selected speed  Functional Outcome Measures  Results Comments  BERG 40/56 Increased fall risk  DGI    FGA    TUG 22.2 seconds Increased fall risk  5TSTS Unable Increased fall risk  6 Minute Walk Test    10 Meter Gait Speed Self-selected: 20.5s = 0.49 m/s; Fastest: 18.0s = 0.56 m/s Below community ambulation speed  (Blank rows = not tested)   TODAY'S TREATMENT: 08/07/23    SUBJECTIVE: Pt reports ongoing BLE weakness. Mild soreness but not excessive after the last therapy session. Ongoing chronic low back pain. Pt still reporting intense sharp/achey pain in his shin which is secondary to a chronic injury from 83 y/o. No specific questions or concerns currently.   PAIN: Chronic low back pain. Sharp R shin pain.    Therapeutic Activity NuStep (seat 12, hands 13) L0-4 x 10 minutes for BLE strengthening and warm-up during interval history (5 minutes unbilled); Forward 6 step-ups alternating leading LE x 15 each; Lateral 6 step-ups x 15 toward each side; Sit to stand from regular height chair without UE support and overhead press with 6# ball 2 x 10; Standing heel raises x 20;  Standing hip strengthening with 3# ankle weights (AW): Hip flexion marches x 15 BLE; HS curls x 15 BLE; Hip abduction x 15 BLE; Hip extension x 15 BLE; Airex balance beam side stepping x multiple lengths;  Airex balance beam tandem balance alternating forward LE x 30s each; Seated LAQ with 3# ankle weights x 15 BLE;  Balancing on AE pad basketball free throws x 25 shots  ADD ball squeezes 2 x 20 x 3  Not Performed: Side stepping with 3# AW x multiple lengths;   PATIENT EDUCATION:  Education details: Pt educated throughout session about proper posture and technique with exercises. Improved exercise technique, movement at target joints, use of target muscles after min to mod verbal,  visual, tactile cues.  Person educated: Patient Education method: Explanation, Verbal cues, and Handouts Education comprehension: verbalized understanding and returned demonstration   HOME EXERCISE PROGRAM:  Access Code: [email or web address] URL: [email or web address] Date: 05/31/2023 Prepared by: Selinda Eck  Exercises - Seated Long Arc Quad  - 1  x daily - 3-4 x weekly - 3 sets - 10 reps - 2s hold - Seated Hip Abduction with Resistance  - 1 x daily - 3-4 x weekly - 3 sets - 10 reps - 2s hold - Heel Raises with Counter Support  - 1 x daily - 3-4 x weekly - 3 sets - 10 reps - 2s hold - Mini Squat with Counter Support  - 1 x daily - 3-4 x weekly - 3 sets - 10 reps - Semi-Tandem Balance at [employer] Eyes Open  - 1 x daily - 7 x weekly - 2-3 sets - 10 reps - 10-30 hold   ASSESSMENT:  CLINICAL IMPRESSION: Worked on strengthening and balance exercises during session today with patient. Pt demonstrated increased BLE weakness this session. No HEP modifications at this time. Pt encouraged to follow-up as scheduled. He will benefit from PT services to address deficits in strength, balance, and mobility in order to return to full function at home and decrease his risk for falls.         OBJECTIVE IMPAIRMENTS: Abnormal gait, decreased balance, difficulty walking, decreased strength, impaired sensation, and pain.   ACTIVITY LIMITATIONS: bending, standing, squatting, stairs, transfers, and caring for others  PARTICIPATION LIMITATIONS: meal prep, cleaning, laundry, shopping, and community activity  PERSONAL FACTORS: Age, Past/current experiences, Time since onset of injury/illness/exacerbation, and 3+ comorbidities: generalized polyneuropathy, COPD, DM, NSTEMI, and spinal stenosis are also affecting patient's functional outcome.   REHAB POTENTIAL: Fair    CLINICAL DECISION MAKING: Unstable/unpredictable  EVALUATION COMPLEXITY: High   GOALS: Goals reviewed with patient? No  SHORT  TERM GOALS: Target date: 07/10/2023  Pt will be independent with HEP in order to improve strength and balance in order to decrease fall risk and improve function at home. Baseline:  Goal status: ONGOING   LONG TERM GOALS: Target date: 08/21/2023  Pt will improve ABC by at least 13% in order to demonstrate clinically significant improvement in balance confidence.  Baseline: 20%; 07/10/23: 30% Goal status: PARTIALLY MET  2.  Pt will improve BERG by at least 3 points in order to demonstrate clinically significant improvement in balance.   Baseline: 40/56; 07/10/23: 50/56 Goal status: ACHIEVED  3.  Pt will increase self-selected to at least 0.6 m/s in order to demonstrate clinically significant improvement in limited community ambulation.         Baseline: self-selected: 0.49 m/s; 07/10/23: 0.52 m/s Goal status: REVISED  4. Pt will be able to complete 5TSTS without UE support in order to demonstrate clinically significant improvement in LE strength      Baseline: Unable to perform sit to stand without heavy UE assist; 07/10/23: 11.4s Goal status: ACHIEVED  5. Pt will decrease TUG to below 14 seconds/decrease in order to demonstrate decreased fall risk.  Baseline: 22.2s; 07/10/23: 14.0s;  Goal status: PARTIALLY MET  PLAN: PT FREQUENCY: 2x/week  PT DURATION: 12 weeks  PLANNED INTERVENTIONS: Therapeutic exercises, Therapeutic activity, Neuromuscular re-education, Balance training, Gait training, Patient/Family education, Self Care, Joint mobilization, Joint manipulation, Vestibular training, Canalith repositioning, Orthotic/Fit training, DME instructions, Dry Needling, Electrical stimulation, Spinal manipulation, Spinal mobilization, Cryotherapy, Moist heat, Taping, Traction, Ultrasound, Ionotophoresis 4mg /ml Dexamethasone , Manual therapy, and Re-evaluation.  PLAN FOR NEXT SESSION: progress strengthening and introduce balance exercises, modify/progress HEP as needed;   Vernell Moats, SPT   Jason D Huprich PT, DPT, GCS  9:39 AM,08/08/23

## 2023-08-09 ENCOUNTER — Ambulatory Visit

## 2023-08-09 DIAGNOSIS — M6281 Muscle weakness (generalized): Secondary | ICD-10-CM | POA: Diagnosis not present

## 2023-08-09 DIAGNOSIS — R262 Difficulty in walking, not elsewhere classified: Secondary | ICD-10-CM | POA: Diagnosis not present

## 2023-08-09 DIAGNOSIS — R2681 Unsteadiness on feet: Secondary | ICD-10-CM

## 2023-08-09 NOTE — Therapy (Signed)
 OUTPATIENT PHYSICAL THERAPY BALANCE TREATMENT   Patient Name: Sean Reyes. MRN: [id number] DOB:10-Nov-1940, 83 y.o., male Today's Date: 08/10/2023  END OF SESSION:  PT End of Session - 08/09/23 1407     Visit Number 19    Number of Visits 25    Date for PT Re-Evaluation 08/21/23    Authorization Type eval: 05/29/23;    Authorization Time Period Medicare A&B 2025  VL: Based on MN  No auth req    PT Start Time 1400    PT Stop Time 1447    PT Time Calculation (min) 47 min    Equipment Utilized During Treatment Gait belt    Activity Tolerance Patient tolerated treatment well;Patient limited by fatigue    Behavior During Therapy WFL for tasks assessed/performed         Past Medical History:  Diagnosis Date   Anemia    Anxiety    Arthritis    Arthritis of neck    Atrial fibrillation (HCC)    Cataracts, bilateral    Complication of anesthesia    pt reports low BP's after surgery at [facility] and difficulty awakening   Depression    Diabetes (HCC)    dx 6-8 yrs ago   Dysrhythmia    a-fib   GERD (gastroesophageal reflux disease)    OCC TAKES ALKA SELTZER   History of kidney stones    10-15 yrs ago   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   Hyperlipidemia    Hypertension    Myocardial infarction (HCC) 12/2020   Nocturia    S/P ablation of atrial fibrillation    Ablative therapy   Sleep apnea    CPAP    Spinal stenosis    Tachycardia, unspecified    Past Surgical History:  Procedure Laterality Date   ABLATION     ANTERIOR LAT LUMBAR FUSION N/A 06/27/2017   Procedure: Anterior Lateral Lumbar Interbody  Fusion - Lumbar Two-Lumbar Three - Lumbar Three-Lumbar Four, Posterior Lumbar Interbody Fusion Lumbar Four- Five;  Surgeon: Onetha Kuba, MD;  Location: [facility] OR;  Service: Neurosurgery;  Laterality: N/A;  Anterior Lateral Lumbar Interbody  Fusion - Lumbar Two-Lumbar Three - Lumbar Three-Lumbar Four, Posterior Lumbar Interbody Fusion Lumbar Four- Five   BACK  SURGERY     CARDIAC CATHETERIZATION     CARDIOVERSION N/A 08/29/2018   Procedure: CARDIOVERSION;  Surgeon: Hester Wolm PARAS, MD;  Location: [facility] ORS;  Service: Cardiovascular;  Laterality: N/A;   CARDIOVERSION N/A 09/24/2018   Procedure: CARDIOVERSION;  Surgeon: Hester Wolm PARAS, MD;  Location: [facility] ORS;  Service: Cardiovascular;  Laterality: N/A;   COLONOSCOPY WITH PROPOFOL  N/A 10/05/2015   Procedure: COLONOSCOPY WITH PROPOFOL ;  Surgeon: Gladis RAYMOND Mariner, MD;  Location: [facility] ENDOSCOPY;  Service: Endoscopy;  Laterality: N/A;   COLONOSCOPY WITH PROPOFOL  N/A 11/01/2020   Procedure: COLONOSCOPY WITH PROPOFOL ;  Surgeon: Therisa Bi, MD;  Location: [facility] ENDOSCOPY;  Service: Gastroenterology;  Laterality: N/A;   CORONARY STENT INTERVENTION N/A 12/22/2020   Procedure: CORONARY STENT INTERVENTION;  Surgeon: Ammon Blunt, MD;  Location: [facility] INVASIVE CV LAB;  Service: Cardiovascular;  Laterality: N/A;   ESOPHAGOGASTRODUODENOSCOPY N/A 11/01/2020   Procedure: ESOPHAGOGASTRODUODENOSCOPY (EGD);  Surgeon: Therisa Bi, MD;  Location: [facility] ENDOSCOPY;  Service: Gastroenterology;  Laterality: N/A;   ESOPHAGOGASTRODUODENOSCOPY (EGD) WITH PROPOFOL  N/A 04/01/2018   Procedure: ESOPHAGOGASTRODUODENOSCOPY (EGD) WITH PROPOFOL ;  Surgeon: Mariner Gladis RAYMOND, MD;  Location: [facility] ENDOSCOPY;  Service: Endoscopy;  Laterality: N/A;   ESOPHAGOGASTRODUODENOSCOPY (EGD) WITH PROPOFOL   N/A 06/01/2022   Procedure: ESOPHAGOGASTRODUODENOSCOPY (EGD) WITH PROPOFOL ;  Surgeon: Therisa Bi, MD;  Location: [facility] ENDOSCOPY;  Service: Gastroenterology;  Laterality: N/A;   EYE SURGERY     HERNIA REPAIR     JOINT REPLACEMENT Bilateral    hips  RT+  LEFT X2    LEFT HEART CATH AND CORONARY ANGIOGRAPHY N/A 12/22/2020   Procedure: LEFT HEART CATH AND CORONARY ANGIOGRAPHY;  Surgeon: Ammon Blunt, MD;  Location: [facility] INVASIVE CV LAB;  Service: Cardiovascular;  Laterality: N/A;   LEFT HEART CATH AND CORONARY ANGIOGRAPHY Left  08/23/2021   Procedure: LEFT HEART CATH AND CORONARY ANGIOGRAPHY;  Surgeon: Hester Wolm PARAS, MD;  Location: [facility] INVASIVE CV LAB;  Service: Cardiovascular;  Laterality: Left;   LUMBAR LAMINECTOMY/DECOMPRESSION MICRODISCECTOMY Left 09/13/2016   Procedure: Microdiscectomy - Lumbar two-three,  Lumbar three- - left;  Surgeon: Onetha Kuba, MD;  Location: [facility] OR;  Service: Neurosurgery;  Laterality: Left;   SPINAL CORD STIMULATOR INSERTION  07/08/2019   TONSILLECTOMY     Patient Active Problem List   Diagnosis Date Noted   Lip swelling 07/04/2023   Diabetes mellitus treated with oral medication (HCC) 04/05/2023   Subareolar mass of left breast 10/03/2022   COPD (chronic obstructive pulmonary disease) (HCC) 10/31/2021   Shortness of breath on exertion 08/04/2021   Diabetes mellitus with proteinuria (HCC) 04/04/2021   GERD without esophagitis 04/02/2021   Coronary artery disease 12/28/2020   History of non-ST elevation myocardial infarction (NSTEMI) 12/21/2020   Pain in right shin 10/25/2020   Peripheral vascular disease (HCC) 08/06/2020   Chronic pain syndrome 07/06/2020   Cervical facet joint syndrome 04/08/2020   Spinal cord stimulator status 12/11/2019   Elevated TSH 08/20/2019   CKD (chronic kidney disease) stage 3, GFR 30-59 ml/min (HCC) 01/19/2019   Acquired thrombophilia (HCC) 01/19/2019   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   Postlaminectomy syndrome, lumbar region 01/16/2019   History of fusion of lumbar spine (L2-L5) 01/16/2019   Chronic radicular lumbar pain 01/16/2019   HNP (herniated nucleus pulposus), lumbar 04/29/2018   Advanced care planning/counseling discussion 09/28/2016   Spinal stenosis, lumbar region, 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/13/2016   Hyperlipidemia associated with type 2 diabetes mellitus (HCC) 07/14/2015   Symptomatic anemia 06/30/2015   Benign prostatic hyperplasia without lower urinary tract symptoms 06/02/2015   OSA (obstructive sleep apnea)  03/23/2015   Hypertension associated with diabetes (HCC) 09/28/2014   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HCC) 09/28/2014   H/O prior ablation treatment 10/19/2011   AF (paroxysmal atrial fibrillation) (HCC) 10/19/2011   PCP: Valerio Melanie DASEN, NP  REFERRING PROVIDER: Maree Jannett POUR, MD   REFERRING DIAG: Peripheral Neuropathy  RATIONALE FOR EVALUATION AND TREATMENT: Rehabilitation  THERAPY DIAG: Difficulty in walking, not elsewhere classified  Unsteadiness on feet  Muscle weakness (generalized)  ONSET DATE: 01/25/23 (acute on chronic)  FOLLOW-UP APPT SCHEDULED WITH REFERRING PROVIDER: Yes   FROM INITIAL EVALUATION SUBJECTIVE:  SUBJECTIVE STATEMENT:  Imbalance and LE weakness;  PERTINENT HISTORY:  Lower extremity weakness in patient with 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 He reports weakness in both legs and numbness in toes post surgery in 2018. He experienced a fall on 01/25/2023 at home, resulting in a L hip injury. No residual pain in hip at this time. No additional falls since December. He reports progressive LE weakness impairing his functional ability at home. He is known to this clinic from prior episodes of care for similar concerns.  Prior history: 11/02/22 Patient reports to physical therapy today with a chief concern of muscle weakness and imbalance. Patient reports progressive worsening in balance and muscular strength. He states that he has loss of sensation in his  toes he reports they are asleep. Patient reports pain in the anterior thigh and bilateral calves. He currently uses an loftstand crutch in his RUE. Lately patient has reported difficulty with dressing LE, performing fine motor movements with left hand. He denies falls, saddle parasthesia, nausea, vomitting, night sweats.    10/17/2022 Patient reported he is not doing well lately. He has been seeing Dr. Clois. He reports his legs are so weak that he can hardly walk, so he has been using a crutch for the last 1-2 years. Reported numbness and loss of sensation in his toes. He also reported that recently his left hand has began to has some numbness to where it feels like it is asleep. Denies pain, loss of sensation, coldness, burning. Denies falls.  Imaging: NCS conducted on 11/12/2018: Abnormal study. 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 There is also preliminary evidence of a superimposed left lower lumbosacral polyradiculopathy, based on NCV and distal needle exam results. I cannot test lumbar paraspinals due to patient on Eliquis .    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 Yes. 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  Recent memory is intact.  Remote memory is intact.  Attention span and concentration are intact.  Expressive speech is intact.  Patient's fund of knowledge is  within normal limits for educational level.    Gross Musculoskeletal Assessment Tremor: None Bulk: Normal Tone: Normal  Posture: Forward head and rounded shoulders  AROM Deferred specific measurements. Functional motion intact;  LE MMT: MMT (out of 5) Right  Left   Hip flexion 4 4+  Hip extension    Hip abduction (seated) 3+ 3+  Hip adduction (seated 3+ 3+  Hip internal rotation    Hip external rotation    Knee flexion (seated) 4 4  Knee extension 4 4  Ankle dorsiflexion 4 4  Ankle plantarflexion    Ankle inversion    Ankle eversion    (* = pain; Blank rows = not tested)  Transfers: Assistive device utilized: Single Lofstrand RUE  Sit to stand: Modified independence Stand to sit: Modified independence Chair to chair: Modified independence Floor: Deferred  Stairs: Level of Assistance: CGA Stair Negotiation Technique: Alternating Pattern  with Bilateral Rails Number of Stairs: 4  Height of Stairs: 6  Comments: Slow and labored ascending/descending. No overt LOB;  Gait: Gait pattern: decreased step length- Right, decreased step length- Left, trunk flexed, poor foot clearance- Right, and poor foot clearance- Left Distance walked: 150' over the course of  evaluation Assistive device utilized: Single Lofstrand in RUE Level of assistance: Modified independence Comments: Slow and labored ambulation. Decreased self selected speed  Functional Outcome Measures  Results Comments  BERG 40/56 Increased fall risk  DGI    FGA    TUG 22.2 seconds Increased fall risk  5TSTS Unable Increased fall risk  6 Minute Walk Test    10 Meter Gait Speed Self-selected: 20.5s = 0.49 m/s; Fastest: 18.0s = 0.56 m/s Below community ambulation speed  (Blank rows = not tested)   TODAY'S TREATMENT: 08/09/23    SUBJECTIVE: Pt reports ongoing BLE weakness. Mild soreness but not excessive after the last therapy session. Ongoing chronic low back pain. Pt still reporting intense sharp/achey pain in his shin which is secondary to a chronic injury from 83 y/o. No specific questions or concerns currently.   PAIN: Chronic low back pain. Sharp R shin pain.    Therapeutic Activity SciFit 10', lvl 4, for BLE strengthening and warm-up during interval history (5 minutes unbilled); Forward 6 step-ups alternating leading LE x 15 each; Lateral 6 step-ups x 15 toward each side; Sit to stand from regular height chair without UE support and overhead press with 6# ball 2 x 10; Standing heel raises x 20;  Standing hip strengthening with 3# ankle weights (AW): Hip flexion marches x 15 BLE; HS curls x 15 BLE; Hip abduction x 15 BLE; Hip extension x 15 BLE;  Airex balance beam side stepping x multiple lengths;  Airex balance beam tandem balance alternating forward LE x 30s each;  Seated LAQ with 3# ankle weights x 15 BLE; ADD ball squeezes 2 x 20 x 3  Not Performed: Side stepping with 3# AW x multiple lengths; NuStep (seat 12, hands 13) L0-4 x 10 minutes for BLE strengthening and warm-up during interval history (5 minutes unbilled); Balancing on AE pad basketball free throws x 25 shots   PATIENT EDUCATION:  Education details: Pt educated throughout session about proper  posture and technique with exercises. Improved exercise technique, movement at target joints, use of target muscles after min to mod verbal, visual, tactile cues.  Person educated: Patient Education method: Explanation, Verbal cues, and Handouts Education comprehension: verbalized understanding and returned demonstration   HOME EXERCISE PROGRAM:  Access Code: [email or web address] URL: [email or web address] Date:  05/31/2023 Prepared by: Selinda Eck  Exercises - Seated Long Arc Quad  - 1 x daily - 3-4 x weekly - 3 sets - 10 reps - 2s hold - Seated Hip Abduction with Resistance  - 1 x daily - 3-4 x weekly - 3 sets - 10 reps - 2s hold - Heel Raises with Counter Support  - 1 x daily - 3-4 x weekly - 3 sets - 10 reps - 2s hold - Mini Squat with Counter Support  - 1 x daily - 3-4 x weekly - 3 sets - 10 reps - Semi-Tandem Balance at [employer] Eyes Open  - 1 x daily - 7 x weekly - 2-3 sets - 10 reps - 10-30 hold   ASSESSMENT:  CLINICAL IMPRESSION: Worked on strengthening and balance exercises during session today with patient. Pt demonstrated increased BLE weakness and general fatigue this session. Pt needed increased frequency in rest breaks this session secondary to general fatigue. No HEP modifications at this time. Pt encouraged to follow-up as scheduled. He will benefit from PT services to address deficits in strength, balance, and mobility in order to return to full function at home and decrease his risk for falls.         OBJECTIVE IMPAIRMENTS: Abnormal gait, decreased balance, difficulty walking, decreased strength, impaired sensation, and pain.   ACTIVITY LIMITATIONS: bending, standing, squatting, stairs, transfers, and caring for others  PARTICIPATION LIMITATIONS: meal prep, cleaning, laundry, shopping, and community activity  PERSONAL FACTORS: Age, Past/current experiences, Time since onset of injury/illness/exacerbation, and 3+ comorbidities: generalized polyneuropathy, COPD,  DM, NSTEMI, and spinal stenosis are also affecting patient's functional outcome.   REHAB POTENTIAL: Fair    CLINICAL DECISION MAKING: Unstable/unpredictable  EVALUATION COMPLEXITY: High   GOALS: Goals reviewed with patient? No  SHORT TERM GOALS: Target date: 07/10/2023  Pt will be independent with HEP in order to improve strength and balance in order to decrease fall risk and improve function at home. Baseline:  Goal status: ONGOING   LONG TERM GOALS: Target date: 08/21/2023  Pt will improve ABC by at least 13% in order to demonstrate clinically significant improvement in balance confidence.  Baseline: 20%; 07/10/23: 30% Goal status: PARTIALLY MET  2.  Pt will improve BERG by at least 3 points in order to demonstrate clinically significant improvement in balance.   Baseline: 40/56; 07/10/23: 50/56 Goal status: ACHIEVED  3.  Pt will increase self-selected to at least 0.6 m/s in order to demonstrate clinically significant improvement in limited community ambulation.         Baseline: self-selected: 0.49 m/s; 07/10/23: 0.52 m/s Goal status: REVISED  4. Pt will be able to complete 5TSTS without UE support in order to demonstrate clinically significant improvement in LE strength      Baseline: Unable to perform sit to stand without heavy UE assist; 07/10/23: 11.4s Goal status: ACHIEVED  5. Pt will decrease TUG to below 14 seconds/decrease in order to demonstrate decreased fall risk.  Baseline: 22.2s; 07/10/23: 14.0s;  Goal status: PARTIALLY MET  PLAN: PT FREQUENCY: 2x/week  PT DURATION: 12 weeks  PLANNED INTERVENTIONS: Therapeutic exercises, Therapeutic activity, Neuromuscular re-education, Balance training, Gait training, Patient/Family education, Self Care, Joint mobilization, Joint manipulation, Vestibular training, Canalith repositioning, Orthotic/Fit training, DME instructions, Dry Needling, Electrical stimulation, Spinal manipulation, Spinal mobilization, Cryotherapy,  Moist heat, Taping, Traction, Ultrasound, Ionotophoresis 4mg /ml Dexamethasone , Manual therapy, and Re-evaluation.  PLAN FOR NEXT SESSION: progress strengthening and introduce balance exercises, modify/progress HEP as needed;   Vernell Moats,  SPT  Selinda BIRCH Huprich PT, DPT, GCS  11:19 AM,08/10/23

## 2023-08-13 ENCOUNTER — Ambulatory Visit

## 2023-08-13 DIAGNOSIS — R2681 Unsteadiness on feet: Secondary | ICD-10-CM

## 2023-08-13 DIAGNOSIS — R262 Difficulty in walking, not elsewhere classified: Secondary | ICD-10-CM

## 2023-08-13 DIAGNOSIS — M6281 Muscle weakness (generalized): Secondary | ICD-10-CM | POA: Diagnosis not present

## 2023-08-13 NOTE — Therapy (Signed)
 OUTPATIENT PHYSICAL THERAPY BALANCE PROGRESS NOTE Physical Therapy Progress Note  Dates of reporting period  07/12/23   to   08/13/23    Patient Name: Sean Reyes. MRN: [id number] DOB:08-Mar-1940, 83 y.o., male Today's Date: 08/13/2023  END OF SESSION:  PT End of Session - 08/13/23 1022     Visit Number 20    Number of Visits 25    Date for PT Re-Evaluation 08/21/23    Authorization Type eval: 05/29/23;    Authorization Time Period Medicare A&B 2025  VL: Based on [place]  No auth req    PT Start Time 1015    PT Stop Time 1101    PT Time Calculation (min) 46 min    Equipment Utilized During Treatment Gait belt    Activity Tolerance Patient tolerated treatment well;Patient limited by fatigue    Behavior During Therapy WFL for tasks assessed/performed          Past Medical History:  Diagnosis Date   Anemia    Anxiety    Arthritis    Arthritis of neck    Atrial fibrillation (HCC)    Cataracts, bilateral    Complication of anesthesia    pt reports low BP's after surgery at [facility] and difficulty awakening   Depression    Diabetes (HCC)    dx 6-8 yrs ago   Dysrhythmia    a-fib   GERD (gastroesophageal reflux disease)    OCC TAKES ALKA SELTZER   History of kidney stones    10-15 yrs ago   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   Hyperlipidemia    Hypertension    Myocardial infarction (HCC) 12/2020   Nocturia    S/P ablation of atrial fibrillation    Ablative therapy   Sleep apnea    CPAP    Spinal stenosis    Tachycardia, unspecified    Past Surgical History:  Procedure Laterality Date   ABLATION     ANTERIOR LAT LUMBAR FUSION N/A 06/27/2017   Procedure: Anterior Lateral Lumbar Interbody  Fusion - Lumbar Two-Lumbar Three - Lumbar Three-Lumbar Four, Posterior Lumbar Interbody Fusion Lumbar Four- Five;  Surgeon: Onetha Kuba, MD;  Location: [facility] OR;  Service: Neurosurgery;  Laterality: N/A;  Anterior Lateral Lumbar Interbody  Fusion - Lumbar Two-Lumbar  Three - Lumbar Three-Lumbar Four, Posterior Lumbar Interbody Fusion Lumbar Four- Five   BACK SURGERY     CARDIAC CATHETERIZATION     CARDIOVERSION N/A 08/29/2018   Procedure: CARDIOVERSION;  Surgeon: Hester Wolm PARAS, MD;  Location: [facility] ORS;  Service: Cardiovascular;  Laterality: N/A;   CARDIOVERSION N/A 09/24/2018   Procedure: CARDIOVERSION;  Surgeon: Hester Wolm PARAS, MD;  Location: [facility] ORS;  Service: Cardiovascular;  Laterality: N/A;   COLONOSCOPY WITH PROPOFOL  N/A 10/05/2015   Procedure: COLONOSCOPY WITH PROPOFOL ;  Surgeon: Gladis RAYMOND Mariner, MD;  Location: [facility] ENDOSCOPY;  Service: Endoscopy;  Laterality: N/A;   COLONOSCOPY WITH PROPOFOL  N/A 11/01/2020   Procedure: COLONOSCOPY WITH PROPOFOL ;  Surgeon: Therisa Bi, MD;  Location: [facility] ENDOSCOPY;  Service: Gastroenterology;  Laterality: N/A;   CORONARY STENT INTERVENTION N/A 12/22/2020   Procedure: CORONARY STENT INTERVENTION;  Surgeon: Ammon Blunt, MD;  Location: [facility] INVASIVE CV LAB;  Service: Cardiovascular;  Laterality: N/A;   ESOPHAGOGASTRODUODENOSCOPY N/A 11/01/2020   Procedure: ESOPHAGOGASTRODUODENOSCOPY (EGD);  Surgeon: Therisa Bi, MD;  Location: [facility] ENDOSCOPY;  Service: Gastroenterology;  Laterality: N/A;   ESOPHAGOGASTRODUODENOSCOPY (EGD) WITH PROPOFOL  N/A 04/01/2018   Procedure: ESOPHAGOGASTRODUODENOSCOPY (EGD) WITH PROPOFOL ;  Surgeon:  Gaylyn Gladis PENNER, MD;  Location: [facility] ENDOSCOPY;  Service: Endoscopy;  Laterality: N/A;   ESOPHAGOGASTRODUODENOSCOPY (EGD) WITH PROPOFOL  N/A 06/01/2022   Procedure: ESOPHAGOGASTRODUODENOSCOPY (EGD) WITH PROPOFOL ;  Surgeon: Therisa Bi, MD;  Location: [facility] ENDOSCOPY;  Service: Gastroenterology;  Laterality: N/A;   EYE SURGERY     HERNIA REPAIR     JOINT REPLACEMENT Bilateral    hips  RT+  LEFT X2    LEFT HEART CATH AND CORONARY ANGIOGRAPHY N/A 12/22/2020   Procedure: LEFT HEART CATH AND CORONARY ANGIOGRAPHY;  Surgeon: Ammon Blunt, MD;  Location: [facility] INVASIVE CV LAB;   Service: Cardiovascular;  Laterality: N/A;   LEFT HEART CATH AND CORONARY ANGIOGRAPHY Left 08/23/2021   Procedure: LEFT HEART CATH AND CORONARY ANGIOGRAPHY;  Surgeon: Hester Wolm PARAS, MD;  Location: [facility] INVASIVE CV LAB;  Service: Cardiovascular;  Laterality: Left;   LUMBAR LAMINECTOMY/DECOMPRESSION MICRODISCECTOMY Left 09/13/2016   Procedure: Microdiscectomy - Lumbar two-three,  Lumbar three- - left;  Surgeon: Onetha Kuba, MD;  Location: [facility] OR;  Service: Neurosurgery;  Laterality: Left;   SPINAL CORD STIMULATOR INSERTION  07/08/2019   TONSILLECTOMY     Patient Active Problem List   Diagnosis Date Noted   Lip swelling 07/04/2023   Diabetes mellitus treated with oral medication (HCC) 04/05/2023   Subareolar mass of left breast 10/03/2022   COPD (chronic obstructive pulmonary disease) (HCC) 10/31/2021   Shortness of breath on exertion 08/04/2021   Diabetes mellitus with proteinuria (HCC) 04/04/2021   GERD without esophagitis 04/02/2021   Coronary artery disease 12/28/2020   History of non-ST elevation myocardial infarction (NSTEMI) 12/21/2020   Pain in right shin 10/25/2020   Peripheral vascular disease (HCC) 08/06/2020   Chronic pain syndrome 07/06/2020   Cervical facet joint syndrome 04/08/2020   Spinal cord stimulator status 12/11/2019   Elevated TSH 08/20/2019   CKD (chronic kidney disease) stage 3, GFR 30-59 ml/min (HCC) 01/19/2019   Acquired thrombophilia (HCC) 01/19/2019   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   Postlaminectomy syndrome, lumbar region 01/16/2019   History of fusion of lumbar spine (L2-L5) 01/16/2019   Chronic radicular lumbar pain 01/16/2019   HNP (herniated nucleus pulposus), lumbar 04/29/2018   Advanced care planning/counseling discussion 09/28/2016   Spinal stenosis, lumbar region, 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/13/2016   Hyperlipidemia associated with type 2 diabetes mellitus (HCC) 07/14/2015   Symptomatic anemia 06/30/2015   Benign prostatic  hyperplasia without lower urinary tract symptoms 06/02/2015   OSA (obstructive sleep apnea) 03/23/2015   Hypertension associated with diabetes (HCC) 09/28/2014   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HCC) 09/28/2014   H/O prior ablation treatment 10/19/2011   AF (paroxysmal atrial fibrillation) (HCC) 10/19/2011   PCP: Valerio Melanie DASEN, NP  REFERRING PROVIDER: Maree Jannett POUR, MD   REFERRING DIAG: Peripheral Neuropathy  RATIONALE FOR EVALUATION AND TREATMENT: Rehabilitation  THERAPY DIAG: Difficulty in walking, not elsewhere classified  Muscle weakness (generalized)  Unsteadiness on feet  ONSET DATE: 01/25/23 (acute on chronic)  FOLLOW-UP APPT SCHEDULED WITH REFERRING PROVIDER: Yes   FROM INITIAL EVALUATION SUBJECTIVE:  SUBJECTIVE STATEMENT:  Imbalance and LE weakness;  PERTINENT HISTORY:  Lower extremity weakness in patient with 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 He reports weakness in both legs and numbness in toes post surgery in 2018. He experienced a fall on 01/25/2023 at home, resulting in a L hip injury. No residual pain in hip at this time. No additional falls since December. He reports progressive LE weakness impairing his functional ability at home. He is known to this clinic from prior episodes of care for similar concerns.  Prior history: 11/02/22 Patient reports to physical therapy today with a chief concern of muscle weakness and imbalance. Patient reports  progressive worsening in balance and muscular strength. He states that he has loss of sensation in his toes he reports they are asleep. Patient reports pain in the anterior thigh and bilateral calves. He currently uses an loftstand crutch in his RUE. Lately patient has reported difficulty with dressing LE, performing fine motor movements with left hand. He denies falls, saddle parasthesia, nausea, vomitting, night sweats.    10/17/2022 Patient reported he is not doing well lately. He has been seeing Dr. Clois. He reports his legs are so weak that he can hardly walk, so he has been using a crutch for the last 1-2 years. Reported numbness and loss of sensation in his toes. He also reported that recently his left hand has began to has some numbness to where it feels like it is asleep. Denies pain, loss of sensation, coldness, burning. Denies falls.  Imaging: NCS conducted on 11/12/2018: Abnormal study. 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 There is also preliminary evidence of a superimposed left lower lumbosacral polyradiculopathy, based on NCV and distal needle exam results. I cannot test lumbar paraspinals due to patient on Eliquis .    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 Yes. 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  Recent memory is intact.  Remote memory is intact.  Attention span and concentration are intact.  Expressive speech is intact.  Patient's fund of knowledge is  within normal limits for educational level.    Gross Musculoskeletal Assessment Tremor: None Bulk: Normal Tone: Normal  Posture: Forward head and rounded shoulders  AROM Deferred specific measurements. Functional motion intact;  LE MMT: MMT (out of 5) Right  Left   Hip flexion 4 4+  Hip extension    Hip abduction (seated) 3+ 3+  Hip adduction (seated 3+ 3+  Hip internal rotation    Hip external rotation    Knee flexion (seated) 4 4  Knee extension 4 4  Ankle dorsiflexion 4 4  Ankle plantarflexion    Ankle inversion    Ankle eversion    (* = pain; Blank rows = not tested)  Transfers: Assistive device utilized: Single Lofstrand RUE  Sit to stand: Modified independence Stand to sit: Modified independence Chair to chair: Modified independence Floor: Deferred  Stairs: Level of Assistance: CGA Stair Negotiation Technique: Alternating Pattern  with Bilateral Rails Number of Stairs: 4  Height of Stairs: 6  Comments: Slow and labored ascending/descending. No overt LOB;  Gait: Gait pattern: decreased step length- Right, decreased step length- Left, trunk flexed, poor foot  clearance- Right, and poor foot clearance- Left Distance walked: 150' over the course of evaluation Assistive device utilized: Single Lofstrand in RUE Level of assistance: Modified independence Comments: Slow and labored ambulation. Decreased self selected speed  Functional Outcome Measures  Results Comments  BERG 40/56 Increased fall risk  DGI    FGA    TUG 22.2 seconds Increased fall risk  5TSTS Unable Increased fall risk  6 Minute Walk Test    10 Meter Gait Speed Self-selected: 20.5s = 0.49 m/s; Fastest: 18.0s = 0.56 m/s Below community ambulation speed  (Blank rows = not tested)   TODAY'S TREATMENT: 08/13/23    SUBJECTIVE: Pt reports ongoing BLE weakness. Mild soreness but not excessive after the last therapy session. Ongoing chronic low back pain. Pt still reporting intense sharp/achey pain in his shin which is secondary to a chronic injury from 83 y/o. No specific questions or concerns currently.   PAIN: Chronic low back pain. Sharp R shin pain.    Therapeutic Activity NuStep (seat 12, hands 13) L1-4 x 10 minutes, for BLE strengthening and warm-up during interval history (5 minutes unbilled);  Functional Outcome Measures  Results Comments  BERG 52/56   DGI    FGA    TUG 24.53 seconds no crutch 16.20 seconds with crutch Increased fall risk  5TSTS 12.72 seconds Increased fall risk  6 Minute Walk Test    10 Meter Gait Speed Self-selected: 20.5s = 0.49 m/s; Fastest: 16.78s = 0.60 m/s Below community ambulation speed (0.8 m/s)  (Blank rows = not tested)   Not Performed: Side stepping with 3# AW x multiple lengths; Balancing on AE pad basketball free throws x 25 shots  Forward 6 step-ups alternating leading LE x 15 each; Lateral 6 step-ups x 15 toward each side; Sit to stand from regular height chair without UE support and overhead press with 6# ball 2 x 10; Standing heel raises x 20;  Standing hip strengthening with 3# ankle weights (AW): Hip flexion marches x 15  BLE; HS curls x 15 BLE; Hip abduction x 15 BLE; Hip extension x 15 BLE;  Airex balance beam side stepping x multiple lengths;  Airex balance beam tandem balance alternating forward LE x 30s each;  Seated LAQ with 3# ankle weights x 15 BLE; ADD ball squeezes 2 x 20 x 3  PATIENT EDUCATION:  Education details: Pt  educated throughout session about proper posture and technique with exercises. Improved exercise technique, movement at target joints, use of target muscles after min to mod verbal, visual, tactile cues.  Person educated: Patient Education method: Explanation, Verbal cues, and Handouts Education comprehension: verbalized understanding and returned demonstration   HOME EXERCISE PROGRAM:  Access Code: [email or web address] URL: [email or web address] Date: 05/31/2023 Prepared by: Selinda Eck  Exercises - Seated Long Arc Quad  - 1 x daily - 3-4 x weekly - 3 sets - 10 reps - 2s hold - Seated Hip Abduction with Resistance  - 1 x daily - 3-4 x weekly - 3 sets - 10 reps - 2s hold - Heel Raises with Counter Support  - 1 x daily - 3-4 x weekly - 3 sets - 10 reps - 2s hold - Mini Squat with Counter Support  - 1 x daily - 3-4 x weekly - 3 sets - 10 reps - Semi-Tandem Balance at [employer] Eyes Open  - 1 x daily - 7 x weekly - 2-3 sets - 10 reps - 10-30 hold   ASSESSMENT:  CLINICAL IMPRESSION: Updated outcome measures/goals with patient today. He has made significant progress since his initial evaluation. His BERG balance score improved from 40/56 at the initial evaluation, 50/56 at last goal update, to 52/56 today. His balance confidence increased from 20% initially, 30% at last goal update, to 36% this session. However, he still has considerable room for improvement in his balance confidence. He has demonstrated maintenance of his BLE strength evidenced by his 5TSTS and self-selected gait speed. His 47m fast gait speed improved but his self-selected and fast speeds are both still  below the required speed for safe community ambulation. No HEP modifications at this time. Pt encouraged to follow-up as scheduled. He will benefit from PT services to address deficits in strength, balance, and mobility in order to return to full function at home and decrease his risk for falls.    OBJECTIVE IMPAIRMENTS: Abnormal gait, decreased balance, difficulty walking, decreased strength, impaired sensation, and pain.   ACTIVITY LIMITATIONS: bending, standing, squatting, stairs, transfers, and caring for others  PARTICIPATION LIMITATIONS: meal prep, cleaning, laundry, shopping, and community activity  PERSONAL FACTORS: Age, Past/current experiences, Time since onset of injury/illness/exacerbation, and 3+ comorbidities: generalized polyneuropathy, COPD, DM, NSTEMI, and spinal stenosis are also affecting patient's functional outcome.   REHAB POTENTIAL: Fair    CLINICAL DECISION MAKING: Unstable/unpredictable  EVALUATION COMPLEXITY: High   GOALS: Goals reviewed with patient? No  SHORT TERM GOALS: Target date: 07/10/2023  Pt will be independent with HEP in order to improve strength and balance in order to decrease fall risk and improve function at home. Baseline:  Goal status: MET   LONG TERM GOALS: Target date: 08/21/2023  Pt will improve ABC by at least 13% in order to demonstrate clinically significant improvement in balance confidence.  Baseline: 20%; 07/10/23: 30%, 08/13/23: 36.25% Goal status: ACHIEVED  2.  Pt will improve BERG by at least 3 points in order to demonstrate clinically significant improvement in balance.   Baseline: 40/56; 07/10/23: 50/56; 08/13/23: 52/56; Goal status: ACHIEVED  3.  Pt will increase self-selected to at least 0.6 m/s in order to demonstrate clinically significant improvement in limited community ambulation.         Baseline: self-selected: 0.49 m/s; 07/10/23: 0.52 m/s; 08/13/23: 0.49 m/s; Goal status: NOT MET  4. Pt will be able to complete  5TSTS without UE support in order to demonstrate clinically significant improvement in LE  strength. Baseline: Unable to perform sit to stand without heavy UE assist; 07/10/23: 11.4s; 08/13/23: 12.72 s Goal status: ACHIEVED  5. Pt will decrease TUG to below 14 seconds/decrease in order to demonstrate decreased fall risk.  Baseline: 22.2s; 07/10/23: 14.0s; 08/13/23: 24.53s no crutch, 16.20s with crutch Goal status: ONGOING  PLAN: PT FREQUENCY: 2x/week  PT DURATION: 12 weeks  PLANNED INTERVENTIONS: Therapeutic exercises, Therapeutic activity, Neuromuscular re-education, Balance training, Gait training, Patient/Family education, Self Care, Joint mobilization, Joint manipulation, Vestibular training, Canalith repositioning, Orthotic/Fit training, DME instructions, Dry Needling, Electrical stimulation, Spinal manipulation, Spinal mobilization, Cryotherapy, Moist heat, Taping, Traction, Ultrasound, Ionotophoresis 4mg /ml Dexamethasone , Manual therapy, and Re-evaluation.  PLAN FOR NEXT SESSION: progress strengthening exercises, walking/ambulation speed and endurance tasks, modify/progress HEP as needed;   Vernell Moats, SPT  Ozell JAYSON Sero, PT, DPT # [phone] 12:13 PM,08/13/23

## 2023-08-14 ENCOUNTER — Encounter: Payer: Self-pay | Admitting: Cardiovascular Disease

## 2023-08-14 ENCOUNTER — Encounter

## 2023-08-14 ENCOUNTER — Ambulatory Visit: Attending: Cardiovascular Disease | Admitting: Cardiovascular Disease

## 2023-08-14 VITALS — BP 118/60 | HR 65 | Ht 72.0 in | Wt 183.1 lb

## 2023-08-14 DIAGNOSIS — I2510 Atherosclerotic heart disease of native coronary artery without angina pectoris: Secondary | ICD-10-CM | POA: Diagnosis present

## 2023-08-14 DIAGNOSIS — I480 Paroxysmal atrial fibrillation: Secondary | ICD-10-CM | POA: Insufficient documentation

## 2023-08-14 DIAGNOSIS — I10 Essential (primary) hypertension: Secondary | ICD-10-CM | POA: Diagnosis present

## 2023-08-14 DIAGNOSIS — E785 Hyperlipidemia, unspecified: Secondary | ICD-10-CM | POA: Insufficient documentation

## 2023-08-14 NOTE — Patient Instructions (Signed)

## 2023-08-14 NOTE — Progress Notes (Signed)
 Cardiology Office Note   Date:  08/14/2023   ID:  Sean Reyes., DOB 17-Feb-1940, MRN [id number]  PCP:  Valerio Melanie DASEN, NP  Cardiologist:   Deatrice Cage, MD   Chief Complaint  Patient presents with   Follow-up    3 month f/u PET CT c/o low BP pt has reduced BP to 1 tablet. Meds reviewed verbally with pt.      History of Present Illness: Sean Reyes. is a 83 y.o. male who presents for a follow-up visit regarding paroxysmal atrial fibrillation, PVCs and coronary artery disease.   Other medical problems include essential hypertension, hyperlipidemia, type 2 diabetes and chronic kidney disease. The patient is severely limited by back pain which has been chronic.  He had 3 back surgeries He presented in November 2022 with non-ST elevation myocardial infarction.  Cardiac catheterization was done which showed severe proximal RCA stenosis which was treated successfully with PCI and drug-eluting stent placement.  He had repeat cardiac catheterization in July 2023 which showed patent RCA stent with no significant restenosis.  There was borderline 60% stenosis in the distal LAD and mild distal left circumflex disease.    He has known history of paroxysmal atrial fibrillation for many years.  He underwent an ablation procedure at [facility] many years ago and reports having a complication during the procedure that necessitated aborting the procedure.  He subsequently had ablation at [facility] by Dr. Debby with improvement.   Upon his initial evaluation with me in July 2023, he was on propafenone  which was discontinued given his coronary artery disease.  I started him on small dose Toprol  instead.  Toprol  was subsequently discontinued due to symptomatic bradycardia.  He was seen few months ago for atypical chest pain.  He underwent cardiac PET scan which showed normal perfusion and normal ejection fraction.  He has been doing well from a cardiac standpoint with no chest pain or worsening dyspnea.   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   Hyperlipidemia    Hypertension    Myocardial infarction (HCC) 12/2020   Nocturia    S/P ablation of atrial fibrillation    Ablative therapy   Sleep apnea    CPAP    Spinal stenosis    Tachycardia, unspecified     Past Surgical History:  Procedure Laterality Date   ABLATION     ANTERIOR LAT LUMBAR FUSION N/A 06/27/2017   Procedure: Anterior Lateral Lumbar Interbody  Fusion - Lumbar Two-Lumbar Three - Lumbar Three-Lumbar Four, Posterior Lumbar Interbody Fusion Lumbar Four- Five;  Surgeon: Onetha Kuba, MD;  Location: [facility] OR;  Service: Neurosurgery;  Laterality: N/A;  Anterior Lateral Lumbar Interbody  Fusion - Lumbar Two-Lumbar Three - Lumbar Three-Lumbar Four, Posterior Lumbar Interbody Fusion Lumbar Four- Five   BACK SURGERY  CARDIAC CATHETERIZATION     CARDIOVERSION N/A 08/29/2018   Procedure: CARDIOVERSION;  Surgeon: Hester Wolm PARAS, MD;  Location: [facility] ORS;  Service: Cardiovascular;  Laterality: N/A;   CARDIOVERSION N/A 09/24/2018   Procedure: CARDIOVERSION;  Surgeon: Hester Wolm PARAS, MD;  Location: [facility] ORS;  Service: Cardiovascular;  Laterality: N/A;   COLONOSCOPY WITH PROPOFOL  N/A 10/05/2015   Procedure: COLONOSCOPY WITH PROPOFOL ;  Surgeon: Gladis RAYMOND Mariner, MD;  Location: [facility] ENDOSCOPY;  Service: Endoscopy;  Laterality: N/A;   COLONOSCOPY WITH PROPOFOL  N/A 11/01/2020   Procedure: COLONOSCOPY  WITH PROPOFOL ;  Surgeon: Therisa Bi, MD;  Location: [facility] ENDOSCOPY;  Service: Gastroenterology;  Laterality: N/A;   CORONARY STENT INTERVENTION N/A 12/22/2020   Procedure: CORONARY STENT INTERVENTION;  Surgeon: Ammon Blunt, MD;  Location: [facility] INVASIVE CV LAB;  Service: Cardiovascular;  Laterality: N/A;   ESOPHAGOGASTRODUODENOSCOPY N/A 11/01/2020   Procedure: ESOPHAGOGASTRODUODENOSCOPY (EGD);  Surgeon: Therisa Bi, MD;  Location: [facility] ENDOSCOPY;  Service: Gastroenterology;  Laterality: N/A;   ESOPHAGOGASTRODUODENOSCOPY (EGD) WITH PROPOFOL  N/A 04/01/2018   Procedure: ESOPHAGOGASTRODUODENOSCOPY (EGD) WITH PROPOFOL ;  Surgeon: Mariner Gladis RAYMOND, MD;  Location: [facility] ENDOSCOPY;  Service: Endoscopy;  Laterality: N/A;   ESOPHAGOGASTRODUODENOSCOPY (EGD) WITH PROPOFOL  N/A 06/01/2022   Procedure: ESOPHAGOGASTRODUODENOSCOPY (EGD) WITH PROPOFOL ;  Surgeon: Therisa Bi, MD;  Location: [facility] ENDOSCOPY;  Service: Gastroenterology;  Laterality: N/A;   EYE SURGERY     HERNIA REPAIR     JOINT REPLACEMENT Bilateral    hips  RT+  LEFT X2    LEFT HEART CATH AND CORONARY ANGIOGRAPHY N/A 12/22/2020   Procedure: LEFT HEART CATH AND CORONARY ANGIOGRAPHY;  Surgeon: Ammon Blunt, MD;  Location: [facility] INVASIVE CV LAB;  Service: Cardiovascular;  Laterality: N/A;   LEFT HEART CATH AND CORONARY ANGIOGRAPHY Left 08/23/2021   Procedure: LEFT HEART CATH AND CORONARY ANGIOGRAPHY;  Surgeon: Hester Wolm PARAS, MD;  Location: [facility] INVASIVE CV LAB;  Service: Cardiovascular;  Laterality: Left;   LUMBAR LAMINECTOMY/DECOMPRESSION MICRODISCECTOMY Left 09/13/2016   Procedure: Microdiscectomy - Lumbar two-three,  Lumbar three- - left;  Surgeon: Onetha Kuba, MD;  Location: [facility] OR;  Service: Neurosurgery;  Laterality: Left;   SPINAL CORD STIMULATOR INSERTION  07/08/2019   TONSILLECTOMY       Current Outpatient Medications  Medication Sig Dispense Refill   amLODipine  (NORVASC ) 5 MG tablet Take 1.5 tablets (7.5 mg total) by  mouth at bedtime. (Patient taking differently: Take 5 mg by mouth at bedtime.) 135 tablet 3   ANORO ELLIPTA  62.5-25 MCG/ACT AEPB Inhale 1 puff into the lungs daily.     benazepril  (LOTENSIN ) 20 MG tablet Take 1.5 tablets (30 mg total) by mouth daily. (Patient taking differently: Take 20 mg by mouth daily.) 135 tablet 1   cyanocobalamin  1000 MCG tablet Take 1,000 mcg by mouth daily.     ELIQUIS  5 MG TABS tablet Take 1 tablet (5 mg total) by mouth 2 (two) times daily. 180 tablet 3   EPINEPHrine  0.3 mg/0.3 mL IJ SOAJ injection Inject 0.3 mg into the muscle as needed for anaphylaxis. 1 each 0   ferrous sulfate 325 (65 FE) MG EC tablet Take 325 mg by mouth daily with breakfast.     finasteride  (PROSCAR ) 5 MG tablet Take 1 tablet (5 mg total) by mouth daily. 90 tablet 3   metFORMIN  (GLUCOPHAGE ) 500 MG tablet Take 1 tablet (500 mg total) by mouth 2 (two) times daily with a meal. 180 tablet 4   omeprazole  (PRILOSEC) 40 MG capsule Take 1 capsule (40 mg total) by mouth in  the morning and at bedtime. 180 capsule 1   prednisoLONE acetate (PRED FORTE) 1 % ophthalmic suspension Place 1 drop into both eyes daily.     rosuvastatin  (CRESTOR ) 5 MG tablet Take 1 tablet (5 mg total) by mouth daily. 90 tablet 4   solifenacin  (VESICARE ) 5 MG tablet Take 1 tablet (5 mg total) by mouth daily. 90 tablet 3   tamsulosin  (FLOMAX ) 0.4 MG CAPS capsule Take 2 capsules (0.8 mg total) by mouth daily after supper. 180 capsule 3   No current facility-administered medications for this visit.    Allergies:   Levaquin [levofloxacin in d5w], Shellfish allergy, Amiodarone, and Adhesive [tape]    Social History:  The patient  reports that he has never smoked. He has never used smokeless tobacco. He reports that he does not drink alcohol and does not use drugs.   Family History:  The patient's family history includes Brain cancer in his mother; Other in his father.    ROS:  Please see the history of present illness.   Otherwise,  review of systems are positive for none.   All other systems are reviewed and negative.    PHYSICAL EXAM: VS:  BP 118/60 (BP Location: Left Arm, Patient Position: Sitting, Cuff Size: Normal)   Pulse 65   Ht 6' (1.829 m)   Wt 183 lb 2 oz (83.1 kg)   SpO2 99%   BMI 24.84 kg/m  , BMI Body mass index is 24.84 kg/m. GEN: Well nourished, well developed, in no acute distress  HEENT: normal  Neck: no JVD, carotid bruits, or masses Cardiac: RRR; no murmurs, rubs, or gallops,no edema  Respiratory:  clear to auscultation bilaterally, normal work of breathing GI: soft, nontender, nondistended, + BS MS: no deformity or atrophy  Skin: warm and dry, no rash Neuro:  Strength and sensation are intact Psych: euthymic mood, full affect Distal pulses are palpable.  Femoral pulses are +2.   EKG:  EKG is ordered today. The ekg ordered today demonstrates : Sinus rhythm with 1st degree A-V block When compared with ECG of 15-May-2023 14:23, No significant change was found    Recent Labs: 04/13/2023: Hemoglobin 12.2; Platelets 164 07/04/2023: ALT 15; BUN 21; Creatinine, Ser 1.32; Potassium 4.6; Sodium 135    Lipid Panel    Component Value Date/Time   CHOL 87 (L) 07/04/2023 1334   TRIG 80 07/04/2023 1334   HDL 40 07/04/2023 1334   CHOLHDL 2.6 12/21/2020 0652   VLDL 13 12/21/2020 0652   LDLCALC 30 07/04/2023 1334      Wt Readings from Last 3 Encounters:  08/14/23 183 lb 2 oz (83.1 kg)  07/04/23 183 lb (83 kg)  05/15/23 178 lb (80.7 kg)          09/08/2021    8:39 AM  PAD Screen  Previous PAD dx? No  Previous surgical procedure? Yes  Pain with walking? No  Feet/toe relief with dangling? No  Painful, non-healing ulcers? No  Extremities discolored? No      ASSESSMENT AND PLAN:  1.  Coronary artery disease involving native coronary arteries: Status post myocardial infarction and stent placement to the right coronary artery in November 2022.   He does have borderline disease in  the distal LAD but the vessel is too small in that area.  Recent cardiac PET scan showed normal perfusion.  No antiplatelet medication given that he is on Eliquis .  2.  Paroxysmal atrial fibrillation: He is status post ablation and is maintaining in  sinus rhythm.  No breakthrough atrial fibrillation.  Continue anticoagulation with Eliquis  5 mg twice daily.  3.  Essential hypertension: Blood pressures controlled on current medications.    4.  Hyperlipidemia: I reviewed most recent lipid profile done in May which showed an LDL of 30.  Continue small dose rosuvastatin .    Disposition:   FU with me in 6 months  Signed,  Deatrice Cage, MD  08/14/2023 2:41 PM    [facility]

## 2023-08-15 ENCOUNTER — Ambulatory Visit: Attending: Neurology

## 2023-08-15 DIAGNOSIS — R262 Difficulty in walking, not elsewhere classified: Secondary | ICD-10-CM | POA: Diagnosis present

## 2023-08-15 DIAGNOSIS — M5459 Other low back pain: Secondary | ICD-10-CM | POA: Diagnosis present

## 2023-08-15 DIAGNOSIS — R2681 Unsteadiness on feet: Secondary | ICD-10-CM | POA: Insufficient documentation

## 2023-08-15 DIAGNOSIS — M6281 Muscle weakness (generalized): Secondary | ICD-10-CM | POA: Diagnosis not present

## 2023-08-15 NOTE — Therapy (Signed)
 OUTPATIENT PHYSICAL THERAPY BALANCE TREATMENT NOTE   Patient Name: Sean Reyes. MRN: [id number] DOB:Nov 01, 1940, 83 y.o., male Today's Date: 08/15/2023  END OF SESSION:  PT End of Session - 08/15/23 1144     Visit Number 21    Number of Visits 25    Date for PT Re-Evaluation 08/21/23    Authorization Type eval: 05/29/23;    Authorization Time Period Medicare A&B 2025  VL: Based on [place]  No auth req    PT Start Time 1145    PT Stop Time 1226    PT Time Calculation (min) 41 min    Equipment Utilized During Treatment Gait belt    Activity Tolerance Patient tolerated treatment well;Patient limited by fatigue    Behavior During Therapy WFL for tasks assessed/performed           Past Medical History:  Diagnosis Date   Anemia    Anxiety    Arthritis    Arthritis of neck    Atrial fibrillation (HCC)    Cataracts, bilateral    Complication of anesthesia    pt reports low BP's after surgery at [facility] and difficulty awakening   Depression    Diabetes (HCC)    dx 6-8 yrs ago   Dysrhythmia    a-fib   GERD (gastroesophageal reflux disease)    OCC TAKES ALKA SELTZER   History of kidney stones    10-15 yrs ago   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   Hyperlipidemia    Hypertension    Myocardial infarction (HCC) 12/2020   Nocturia    S/P ablation of atrial fibrillation    Ablative therapy   Sleep apnea    CPAP    Spinal stenosis    Tachycardia, unspecified    Past Surgical History:  Procedure Laterality Date   ABLATION     ANTERIOR LAT LUMBAR FUSION N/A 06/27/2017   Procedure: Anterior Lateral Lumbar Interbody  Fusion - Lumbar Two-Lumbar Three - Lumbar Three-Lumbar Four, Posterior Lumbar Interbody Fusion Lumbar Four- Five;  Surgeon: Onetha Kuba, MD;  Location: [facility] OR;  Service: Neurosurgery;  Laterality: N/A;  Anterior Lateral Lumbar Interbody  Fusion - Lumbar Two-Lumbar Three - Lumbar Three-Lumbar Four, Posterior Lumbar Interbody Fusion Lumbar Four- Five    BACK SURGERY     CARDIAC CATHETERIZATION     CARDIOVERSION N/A 08/29/2018   Procedure: CARDIOVERSION;  Surgeon: Hester Wolm PARAS, MD;  Location: [facility] ORS;  Service: Cardiovascular;  Laterality: N/A;   CARDIOVERSION N/A 09/24/2018   Procedure: CARDIOVERSION;  Surgeon: Hester Wolm PARAS, MD;  Location: [facility] ORS;  Service: Cardiovascular;  Laterality: N/A;   COLONOSCOPY WITH PROPOFOL  N/A 10/05/2015   Procedure: COLONOSCOPY WITH PROPOFOL ;  Surgeon: Gladis RAYMOND Mariner, MD;  Location: [facility] ENDOSCOPY;  Service: Endoscopy;  Laterality: N/A;   COLONOSCOPY WITH PROPOFOL  N/A 11/01/2020   Procedure: COLONOSCOPY WITH PROPOFOL ;  Surgeon: Therisa Bi, MD;  Location: [facility] ENDOSCOPY;  Service: Gastroenterology;  Laterality: N/A;   CORONARY STENT INTERVENTION N/A 12/22/2020   Procedure: CORONARY STENT INTERVENTION;  Surgeon: Ammon Blunt, MD;  Location: [facility] INVASIVE CV LAB;  Service: Cardiovascular;  Laterality: N/A;   ESOPHAGOGASTRODUODENOSCOPY N/A 11/01/2020   Procedure: ESOPHAGOGASTRODUODENOSCOPY (EGD);  Surgeon: Therisa Bi, MD;  Location: [facility] ENDOSCOPY;  Service: Gastroenterology;  Laterality: N/A;   ESOPHAGOGASTRODUODENOSCOPY (EGD) WITH PROPOFOL  N/A 04/01/2018   Procedure: ESOPHAGOGASTRODUODENOSCOPY (EGD) WITH PROPOFOL ;  Surgeon: Mariner Gladis RAYMOND, MD;  Location: [facility] ENDOSCOPY;  Service: Endoscopy;  Laterality: N/A;   ESOPHAGOGASTRODUODENOSCOPY (  EGD) WITH PROPOFOL  N/A 06/01/2022   Procedure: ESOPHAGOGASTRODUODENOSCOPY (EGD) WITH PROPOFOL ;  Surgeon: Therisa Bi, MD;  Location: [facility] ENDOSCOPY;  Service: Gastroenterology;  Laterality: N/A;   EYE SURGERY     HERNIA REPAIR     JOINT REPLACEMENT Bilateral    hips  RT+  LEFT X2    LEFT HEART CATH AND CORONARY ANGIOGRAPHY N/A 12/22/2020   Procedure: LEFT HEART CATH AND CORONARY ANGIOGRAPHY;  Surgeon: Ammon Blunt, MD;  Location: [facility] INVASIVE CV LAB;  Service: Cardiovascular;  Laterality: N/A;   LEFT HEART CATH AND CORONARY ANGIOGRAPHY Left  08/23/2021   Procedure: LEFT HEART CATH AND CORONARY ANGIOGRAPHY;  Surgeon: Hester Wolm PARAS, MD;  Location: [facility] INVASIVE CV LAB;  Service: Cardiovascular;  Laterality: Left;   LUMBAR LAMINECTOMY/DECOMPRESSION MICRODISCECTOMY Left 09/13/2016   Procedure: Microdiscectomy - Lumbar two-three,  Lumbar three- - left;  Surgeon: Onetha Kuba, MD;  Location: [facility] OR;  Service: Neurosurgery;  Laterality: Left;   SPINAL CORD STIMULATOR INSERTION  07/08/2019   TONSILLECTOMY     Patient Active Problem List   Diagnosis Date Noted   Lip swelling 07/04/2023   Diabetes mellitus treated with oral medication (HCC) 04/05/2023   Subareolar mass of left breast 10/03/2022   COPD (chronic obstructive pulmonary disease) (HCC) 10/31/2021   Shortness of breath on exertion 08/04/2021   Diabetes mellitus with proteinuria (HCC) 04/04/2021   GERD without esophagitis 04/02/2021   Coronary artery disease 12/28/2020   History of non-ST elevation myocardial infarction (NSTEMI) 12/21/2020   Pain in right shin 10/25/2020   Peripheral vascular disease (HCC) 08/06/2020   Chronic pain syndrome 07/06/2020   Cervical facet joint syndrome 04/08/2020   Spinal cord stimulator status 12/11/2019   Elevated TSH 08/20/2019   CKD (chronic kidney disease) stage 3, GFR 30-59 ml/min (HCC) 01/19/2019   Acquired thrombophilia (HCC) 01/19/2019   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   Postlaminectomy syndrome, lumbar region 01/16/2019   History of fusion of lumbar spine (L2-L5) 01/16/2019   Chronic radicular lumbar pain 01/16/2019   HNP (herniated nucleus pulposus), lumbar 04/29/2018   Advanced care planning/counseling discussion 09/28/2016   Spinal stenosis, lumbar region, 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/13/2016   Hyperlipidemia associated with type 2 diabetes mellitus (HCC) 07/14/2015   Symptomatic anemia 06/30/2015   Benign prostatic hyperplasia without lower urinary tract symptoms 06/02/2015   OSA (obstructive sleep apnea)  03/23/2015   Hypertension associated with diabetes (HCC) 09/28/2014   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HCC) 09/28/2014   H/O prior ablation treatment 10/19/2011   AF (paroxysmal atrial fibrillation) (HCC) 10/19/2011   PCP: Valerio Melanie DASEN, NP  REFERRING PROVIDER: Maree Jannett POUR, MD   REFERRING DIAG: Peripheral Neuropathy  RATIONALE FOR EVALUATION AND TREATMENT: Rehabilitation  THERAPY DIAG: Difficulty in walking, not elsewhere classified  Muscle weakness (generalized)  Unsteadiness on feet  Other low back pain  ONSET DATE: 01/25/23 (acute on chronic)  FOLLOW-UP APPT SCHEDULED WITH REFERRING PROVIDER: Yes   FROM INITIAL EVALUATION SUBJECTIVE:  SUBJECTIVE STATEMENT:  Imbalance and LE weakness;  PERTINENT HISTORY:  Lower extremity weakness in patient with 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 He reports weakness in both legs and numbness in toes post surgery in 2018. He experienced a fall on 01/25/2023 at home, resulting in a L hip injury. No residual pain in hip at this time. No additional falls since December. He reports progressive LE weakness impairing his functional ability at home. He is known to this clinic from prior episodes of care for similar concerns.  Prior history: 11/02/22 Patient reports to physical therapy today with a chief concern of muscle weakness and imbalance. Patient reports progressive worsening in balance and muscular strength. He states that he has  loss of sensation in his toes he reports they are asleep. Patient reports pain in the anterior thigh and bilateral calves. He currently uses an loftstand crutch in his RUE. Lately patient has reported difficulty with dressing LE, performing fine motor movements with left hand. He denies falls, saddle parasthesia, nausea, vomitting, night sweats.    10/17/2022 Patient reported he is not doing well lately. He has been seeing Dr. Clois. He reports his legs are so weak that he can hardly walk, so he has been using a crutch for the last 1-2 years. Reported numbness and loss of sensation in his toes. He also reported that recently his left hand has began to has some numbness to where it feels like it is asleep. Denies pain, loss of sensation, coldness, burning. Denies falls.  Imaging: NCS conducted on 11/12/2018: Abnormal study. 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 There is also preliminary evidence of a superimposed left lower lumbosacral polyradiculopathy, based on NCV and distal needle exam results. I cannot test lumbar paraspinals due to patient on Eliquis .    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 Yes. 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  Recent memory is intact.  Remote memory is intact.  Attention span and concentration are intact.  Expressive speech is intact.  Patient's fund of knowledge is  within normal limits for educational level.    Gross Musculoskeletal Assessment Tremor: None Bulk: Normal Tone: Normal  Posture: Forward head and rounded shoulders  AROM Deferred specific measurements. Functional motion intact;  LE MMT: MMT (out of 5) Right  Left   Hip flexion 4 4+  Hip extension    Hip abduction (seated) 3+ 3+  Hip adduction (seated 3+ 3+  Hip internal rotation    Hip external rotation    Knee flexion (seated) 4 4  Knee extension 4 4  Ankle dorsiflexion 4 4  Ankle plantarflexion    Ankle inversion    Ankle eversion    (* = pain; Blank rows = not tested)  Transfers: Assistive device utilized: Single Lofstrand RUE  Sit to stand: Modified independence Stand to sit: Modified independence Chair to chair: Modified independence Floor: Deferred  Stairs: Level of Assistance: CGA Stair Negotiation Technique: Alternating Pattern  with Bilateral Rails Number of Stairs: 4  Height of Stairs: 6  Comments: Slow and labored ascending/descending. No overt LOB;  Gait: Gait pattern: decreased step length- Right, decreased step length- Left, trunk flexed, poor foot clearance- Right, and poor foot clearance- Left Distance walked:  150' over the course of evaluation Assistive device utilized: Single Lofstrand in RUE Level of assistance: Modified independence Comments: Slow and labored ambulation. Decreased self selected speed  Functional Outcome Measures  Results Comments  BERG 40/56 Increased fall risk  DGI    FGA    TUG 22.2 seconds Increased fall risk  5TSTS Unable Increased fall risk  6 Minute Walk Test    10 Meter Gait Speed Self-selected: 20.5s = 0.49 m/s; Fastest: 18.0s = 0.56 m/s Below community ambulation speed  (Blank rows = not tested)   TODAY'S TREATMENT: 08/15/23    SUBJECTIVE: Patient reports ongoing BLE weakness and complaining of soreness in B knees likely due to weather.   PAIN: Chronic low back pain. Sharp R shin pain.    Therapeutic Activity NuStep (seat 12, hands 13) L1-4 x 10 minutes, for BLE strengthening and warm-up during interval history;  Side stepping with 3# AW x multiple lengths; Forward 6 step-ups alternating leading LE x 15 each; Sit to stand from regular height ch air without UE support and overhead press with 6# ball x 10; with feet on airex x 10 with no UE support  Fwd step over 6 hurdles in // bars x 3 laps   Standing hip strengthening with 3# ankle weights (AW): Hip flexion marches x 15 BLE; Hip abduction x 15 BLE;  Seated LAQ with 3# ankle weights x 15 BLE;   PATIENT EDUCATION:  Education details: Pt educated throughout session about proper posture and technique with exercises. Improved exercise technique, movement at target joints, use of target muscles after min to mod verbal, visual, tactile cues.  Person educated: Patient Education method: Explanation, Verbal cues, and Handouts Education comprehension: verbalized understanding and returned demonstration   HOME EXERCISE PROGRAM:  Access Code: [email or web address] URL: [email or web address] Date: 05/31/2023 Prepared by: Selinda Eck  Exercises - Seated Long Arc Quad  - 1 x daily - 3-4 x weekly - 3  sets - 10 reps - 2s hold - Seated Hip Abduction with Resistance  - 1 x daily - 3-4 x weekly - 3 sets - 10 reps - 2s hold - Heel Raises with Counter Support  - 1 x daily - 3-4 x weekly - 3 sets - 10 reps - 2s hold - Mini Squat with Counter Support  - 1 x  daily - 3-4 x weekly - 3 sets - 10 reps - Semi-Tandem Balance at [employer] Eyes Open  - 1 x daily - 7 x weekly - 2-3 sets - 10 reps - 10-30 hold   ASSESSMENT:  CLINICAL IMPRESSION:   Patient arrives to treatment session motivated to participate. Continues to complain of sharp R shin pain. Session focused on BLE strengthening and dynamic balance activities. Requires 2-3 seated rest breaks throughout but otherwise tolerated session well. He will benefit from PT services to address deficits in strength, balance, and mobility in order to return to full function at home and decrease his risk for falls.    OBJECTIVE IMPAIRMENTS: Abnormal gait, decreased balance, difficulty walking, decreased strength, impaired sensation, and pain.   ACTIVITY LIMITATIONS: bending, standing, squatting, stairs, transfers, and caring for others  PARTICIPATION LIMITATIONS: meal prep, cleaning, laundry, shopping, and community activity  PERSONAL FACTORS: Age, Past/current experiences, Time since onset of injury/illness/exacerbation, and 3+ comorbidities: generalized polyneuropathy, COPD, DM, NSTEMI, and spinal stenosis are also affecting patient's functional outcome.   REHAB POTENTIAL: Fair    CLINICAL DECISION MAKING: Unstable/unpredictable  EVALUATION COMPLEXITY: High   GOALS: Goals reviewed with patient? No  SHORT TERM GOALS: Target date: 07/10/2023  Pt will be independent with HEP in order to improve strength and balance in order to decrease fall risk and improve function at home. Baseline:  Goal status: MET   LONG TERM GOALS: Target date: 08/21/2023  Pt will improve ABC by at least 13% in order to demonstrate clinically significant improvement in  balance confidence.  Baseline: 20%; 07/10/23: 30%, 08/13/23: 36.25% Goal status: ACHIEVED  2.  Pt will improve BERG by at least 3 points in order to demonstrate clinically significant improvement in balance.   Baseline: 40/56; 07/10/23: 50/56; 08/13/23: 52/56; Goal status: ACHIEVED  3.  Pt will increase self-selected to at least 0.6 m/s in order to demonstrate clinically significant improvement in limited community ambulation.         Baseline: self-selected: 0.49 m/s; 07/10/23: 0.52 m/s; 08/13/23: 0.49 m/s; Goal status: NOT MET  4. Pt will be able to complete 5TSTS without UE support in order to demonstrate clinically significant improvement in LE strength. Baseline: Unable to perform sit to stand without heavy UE assist; 07/10/23: 11.4s; 08/13/23: 12.72 s Goal status: ACHIEVED  5. Pt will decrease TUG to below 14 seconds/decrease in order to demonstrate decreased fall risk.  Baseline: 22.2s; 07/10/23: 14.0s; 08/13/23: 24.53s no crutch, 16.20s with crutch Goal status: ONGOING  PLAN: PT FREQUENCY: 2x/week  PT DURATION: 12 weeks  PLANNED INTERVENTIONS: Therapeutic exercises, Therapeutic activity, Neuromuscular re-education, Balance training, Gait training, Patient/Family education, Self Care, Joint mobilization, Joint manipulation, Vestibular training, Canalith repositioning, Orthotic/Fit training, DME instructions, Dry Needling, Electrical stimulation, Spinal manipulation, Spinal mobilization, Cryotherapy, Moist heat, Taping, Traction, Ultrasound, Ionotophoresis 4mg /ml Dexamethasone , Manual therapy, and Re-evaluation.  PLAN FOR NEXT SESSION: progress strengthening exercises, walking/ambulation speed and endurance tasks, modify/progress HEP as needed;   Maryanne Finder, PT, DPT Physical Therapist - [place]  [facility] 11:45 AM,08/15/23

## 2023-08-16 ENCOUNTER — Encounter

## 2023-08-21 ENCOUNTER — Other Ambulatory Visit: Payer: Self-pay | Admitting: Student in an Organized Health Care Education/Training Program

## 2023-08-21 ENCOUNTER — Ambulatory Visit

## 2023-08-21 DIAGNOSIS — R262 Difficulty in walking, not elsewhere classified: Secondary | ICD-10-CM

## 2023-08-21 DIAGNOSIS — R0602 Shortness of breath: Secondary | ICD-10-CM

## 2023-08-21 DIAGNOSIS — M6281 Muscle weakness (generalized): Secondary | ICD-10-CM | POA: Diagnosis not present

## 2023-08-21 DIAGNOSIS — J449 Chronic obstructive pulmonary disease, unspecified: Secondary | ICD-10-CM

## 2023-08-21 DIAGNOSIS — R2681 Unsteadiness on feet: Secondary | ICD-10-CM

## 2023-08-21 DIAGNOSIS — M5459 Other low back pain: Secondary | ICD-10-CM | POA: Diagnosis not present

## 2023-08-21 NOTE — Therapy (Unsigned)
 OUTPATIENT PHYSICAL THERAPY BALANCE TREATMENT NOTE   Patient Name: Sean Reyes. MRN: [id number] DOB:11-11-1940, 83 y.o., male Today's Date: 08/22/2023  END OF SESSION:  PT End of Session - 08/21/23 1414     Visit Number 22    Number of Visits 25    Date for PT Re-Evaluation 08/21/23    Authorization Type eval: 05/29/23;    Authorization Time Period Medicare A&B 2025  VL: Based on [place]  No auth req    PT Start Time 1400    PT Stop Time 1445    PT Time Calculation (min) 45 min    Equipment Utilized During Treatment Gait belt    Activity Tolerance Patient tolerated treatment well;Patient limited by fatigue    Behavior During Therapy WFL for tasks assessed/performed           Past Medical History:  Diagnosis Date   Anemia    Anxiety    Arthritis    Arthritis of neck    Atrial fibrillation (HCC)    Cataracts, bilateral    Complication of anesthesia    pt reports low BP's after surgery at [facility] and difficulty awakening   Depression    Diabetes (HCC)    dx 6-8 yrs ago   Dysrhythmia    a-fib   GERD (gastroesophageal reflux disease)    OCC TAKES ALKA SELTZER   History of kidney stones    10-15 yrs ago   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   Hyperlipidemia    Hypertension    Myocardial infarction (HCC) 12/2020   Nocturia    S/P ablation of atrial fibrillation    Ablative therapy   Sleep apnea    CPAP    Spinal stenosis    Tachycardia, unspecified    Past Surgical History:  Procedure Laterality Date   ABLATION     ANTERIOR LAT LUMBAR FUSION N/A 06/27/2017   Procedure: Anterior Lateral Lumbar Interbody  Fusion - Lumbar Two-Lumbar Three - Lumbar Three-Lumbar Four, Posterior Lumbar Interbody Fusion Lumbar Four- Five;  Surgeon: Onetha Kuba, MD;  Location: [facility] OR;  Service: Neurosurgery;  Laterality: N/A;  Anterior Lateral Lumbar Interbody  Fusion - Lumbar Two-Lumbar Three - Lumbar Three-Lumbar Four, Posterior Lumbar Interbody Fusion Lumbar Four- Five    BACK SURGERY     CARDIAC CATHETERIZATION     CARDIOVERSION N/A 08/29/2018   Procedure: CARDIOVERSION;  Surgeon: Hester Wolm PARAS, MD;  Location: [facility] ORS;  Service: Cardiovascular;  Laterality: N/A;   CARDIOVERSION N/A 09/24/2018   Procedure: CARDIOVERSION;  Surgeon: Hester Wolm PARAS, MD;  Location: [facility] ORS;  Service: Cardiovascular;  Laterality: N/A;   COLONOSCOPY WITH PROPOFOL  N/A 10/05/2015   Procedure: COLONOSCOPY WITH PROPOFOL ;  Surgeon: Gladis RAYMOND Mariner, MD;  Location: [facility] ENDOSCOPY;  Service: Endoscopy;  Laterality: N/A;   COLONOSCOPY WITH PROPOFOL  N/A 11/01/2020   Procedure: COLONOSCOPY WITH PROPOFOL ;  Surgeon: Therisa Bi, MD;  Location: [facility] ENDOSCOPY;  Service: Gastroenterology;  Laterality: N/A;   CORONARY STENT INTERVENTION N/A 12/22/2020   Procedure: CORONARY STENT INTERVENTION;  Surgeon: Ammon Blunt, MD;  Location: [facility] INVASIVE CV LAB;  Service: Cardiovascular;  Laterality: N/A;   ESOPHAGOGASTRODUODENOSCOPY N/A 11/01/2020   Procedure: ESOPHAGOGASTRODUODENOSCOPY (EGD);  Surgeon: Therisa Bi, MD;  Location: [facility] ENDOSCOPY;  Service: Gastroenterology;  Laterality: N/A;   ESOPHAGOGASTRODUODENOSCOPY (EGD) WITH PROPOFOL  N/A 04/01/2018   Procedure: ESOPHAGOGASTRODUODENOSCOPY (EGD) WITH PROPOFOL ;  Surgeon: Mariner Gladis RAYMOND, MD;  Location: [facility] ENDOSCOPY;  Service: Endoscopy;  Laterality: N/A;   ESOPHAGOGASTRODUODENOSCOPY (  EGD) WITH PROPOFOL  N/A 06/01/2022   Procedure: ESOPHAGOGASTRODUODENOSCOPY (EGD) WITH PROPOFOL ;  Surgeon: Therisa Bi, MD;  Location: [facility] ENDOSCOPY;  Service: Gastroenterology;  Laterality: N/A;   EYE SURGERY     HERNIA REPAIR     JOINT REPLACEMENT Bilateral    hips  RT+  LEFT X2    LEFT HEART CATH AND CORONARY ANGIOGRAPHY N/A 12/22/2020   Procedure: LEFT HEART CATH AND CORONARY ANGIOGRAPHY;  Surgeon: Ammon Blunt, MD;  Location: [facility] INVASIVE CV LAB;  Service: Cardiovascular;  Laterality: N/A;   LEFT HEART CATH AND CORONARY ANGIOGRAPHY Left  08/23/2021   Procedure: LEFT HEART CATH AND CORONARY ANGIOGRAPHY;  Surgeon: Hester Wolm PARAS, MD;  Location: [facility] INVASIVE CV LAB;  Service: Cardiovascular;  Laterality: Left;   LUMBAR LAMINECTOMY/DECOMPRESSION MICRODISCECTOMY Left 09/13/2016   Procedure: Microdiscectomy - Lumbar two-three,  Lumbar three- - left;  Surgeon: Onetha Kuba, MD;  Location: [facility] OR;  Service: Neurosurgery;  Laterality: Left;   SPINAL CORD STIMULATOR INSERTION  07/08/2019   TONSILLECTOMY     Patient Active Problem List   Diagnosis Date Noted   Lip swelling 07/04/2023   Diabetes mellitus treated with oral medication (HCC) 04/05/2023   Subareolar mass of left breast 10/03/2022   COPD (chronic obstructive pulmonary disease) (HCC) 10/31/2021   Shortness of breath on exertion 08/04/2021   Diabetes mellitus with proteinuria (HCC) 04/04/2021   GERD without esophagitis 04/02/2021   Coronary artery disease 12/28/2020   History of non-ST elevation myocardial infarction (NSTEMI) 12/21/2020   Pain in right shin 10/25/2020   Peripheral vascular disease (HCC) 08/06/2020   Chronic pain syndrome 07/06/2020   Cervical facet joint syndrome 04/08/2020   Spinal cord stimulator status 12/11/2019   Elevated TSH 08/20/2019   CKD (chronic kidney disease) stage 3, GFR 30-59 ml/min (HCC) 01/19/2019   Acquired thrombophilia (HCC) 01/19/2019   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   Postlaminectomy syndrome, lumbar region 01/16/2019   History of fusion of lumbar spine (L2-L5) 01/16/2019   Chronic radicular lumbar pain 01/16/2019   HNP (herniated nucleus pulposus), lumbar 04/29/2018   Advanced care planning/counseling discussion 09/28/2016   Spinal stenosis, lumbar region, 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/13/2016   Hyperlipidemia associated with type 2 diabetes mellitus (HCC) 07/14/2015   Symptomatic anemia 06/30/2015   Benign prostatic hyperplasia without lower urinary tract symptoms 06/02/2015   OSA (obstructive sleep apnea)  03/23/2015   Hypertension associated with diabetes (HCC) 09/28/2014   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HCC) 09/28/2014   H/O prior ablation treatment 10/19/2011   AF (paroxysmal atrial fibrillation) (HCC) 10/19/2011   PCP: Valerio Melanie DASEN, NP  REFERRING PROVIDER: Maree Jannett POUR, MD   REFERRING DIAG: Peripheral Neuropathy  RATIONALE FOR EVALUATION AND TREATMENT: Rehabilitation  THERAPY DIAG: Difficulty in walking, not elsewhere classified  Muscle weakness (generalized)  Unsteadiness on feet  ONSET DATE: 01/25/23 (acute on chronic)  FOLLOW-UP APPT SCHEDULED WITH REFERRING PROVIDER: Yes   FROM INITIAL EVALUATION SUBJECTIVE:  SUBJECTIVE STATEMENT:  Imbalance and LE weakness;  PERTINENT HISTORY:  Lower extremity weakness in patient with 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 He reports weakness in both legs and numbness in toes post surgery in 2018. He experienced a fall on 01/25/2023 at home, resulting in a L hip injury. No residual pain in hip at this time. No additional falls since December. He reports progressive LE weakness impairing his functional ability at home. He is known to this clinic from prior episodes of care for similar concerns.  Prior history: 11/02/22 Patient reports to physical therapy today with a chief concern of muscle weakness and imbalance. Patient reports progressive worsening in balance and muscular strength. He states that he has loss of sensation in his  toes he reports they are asleep. Patient reports pain in the anterior thigh and bilateral calves. He currently uses an loftstand crutch in his RUE. Lately patient has reported difficulty with dressing LE, performing fine motor movements with left hand. He denies falls, saddle parasthesia, nausea, vomitting, night sweats.    10/17/2022 Patient reported he is not doing well lately. He has been seeing Dr. Clois. He reports his legs are so weak that he can hardly walk, so he has been using a crutch for the last 1-2 years. Reported numbness and loss of sensation in his toes. He also reported that recently his left hand has began to has some numbness to where it feels like it is asleep. Denies pain, loss of sensation, coldness, burning. Denies falls.  Imaging: NCS conducted on 11/12/2018: Abnormal study. 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 There is also preliminary evidence of a superimposed left lower lumbosacral polyradiculopathy, based on NCV and distal needle exam results. I cannot test lumbar paraspinals due to patient on Eliquis .    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 Yes. 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  Recent memory is intact.  Remote memory is intact.  Attention span and concentration are intact.  Expressive speech is intact.  Patient's fund of knowledge is  within normal limits for educational level.    Gross Musculoskeletal Assessment Tremor: None Bulk: Normal Tone: Normal  Posture: Forward head and rounded shoulders  AROM Deferred specific measurements. Functional motion intact;  LE MMT: MMT (out of 5) Right  Left   Hip flexion 4 4+  Hip extension    Hip abduction (seated) 3+ 3+  Hip adduction (seated 3+ 3+  Hip internal rotation    Hip external rotation    Knee flexion (seated) 4 4  Knee extension 4 4  Ankle dorsiflexion 4 4  Ankle plantarflexion    Ankle inversion    Ankle eversion    (* = pain; Blank rows = not tested)  Transfers: Assistive device utilized: Single Lofstrand RUE  Sit to stand: Modified independence Stand to sit: Modified independence Chair to chair: Modified independence Floor: Deferred  Stairs: Level of Assistance: CGA Stair Negotiation Technique: Alternating Pattern  with Bilateral Rails Number of Stairs: 4  Height of Stairs: 6  Comments: Slow and labored ascending/descending. No overt LOB;  Gait: Gait pattern: decreased step length- Right, decreased step length- Left, trunk flexed, poor foot clearance- Right, and poor foot clearance- Left Distance walked: 150' over the course of  evaluation Assistive device utilized: Single Lofstrand in RUE Level of assistance: Modified independence Comments: Slow and labored ambulation. Decreased self selected speed  Functional Outcome Measures  Results Comments  BERG 40/56 Increased fall risk  DGI    FGA    TUG 22.2 seconds Increased fall risk  5TSTS Unable Increased fall risk  6 Minute Walk Test    10 Meter Gait Speed Self-selected: 20.5s = 0.49 m/s; Fastest: 18.0s = 0.56 m/s Below community ambulation speed  (Blank rows = not tested)   TODAY'S TREATMENT: 08/22/23    SUBJECTIVE: Patient reports ongoing BLE weakness and R shin pain from previous injury 30+ years ago. No falls or stumbles since last session. Pt has no new concerns or questions.   PAIN: Chronic low back pain. Sharp R shin pain.    Therapeutic Activity NuStep (seat 12, hands 13) L1-4 x 10 minutes, for BLE strengthening and warm-up during interval history (5 mins unbilled);  Side stepping with 3# AW x multiple lengths; Forward 6 step-ups alternating leading LE x 15 each; SL step ups on 6 steps x 15 ea LE;  Calf raises at stairs 2 x 20;  Sit to stand with 6 # ball chest press to rebounder 2 x 10; Fwd step over 6 hurdles in // bars x 3 laps   Seated LAQ with 3# ankle weights x 15 BLE;  Standing on AE pad, Alternating Basketball chest press and bounce pass with PT, 2 x 3'  Not Performed this session: Standing hip strengthening with 3# ankle weights (AW): Hip flexion marches x 15 BLE; Hip abduction x 15 BLE;  PATIENT EDUCATION:  Education details: Pt educated throughout session about proper posture and technique with exercises. Improved exercise technique, movement at target joints, use of target muscles after min to mod verbal, visual, tactile cues.  Person educated: Patient Education method: Explanation, Verbal cues, and Handouts Education comprehension: verbalized understanding and returned demonstration   HOME EXERCISE PROGRAM:  Access  Code: [email or web address] URL: [email or web address] Date: 05/31/2023 Prepared by: Selinda Eck  Exercises - Seated Long Arc Quad  - 1 x daily - 3-4 x weekly - 3 sets - 10 reps - 2s hold - Seated Hip Abduction with Resistance  - 1 x daily - 3-4 x weekly - 3 sets -  10 reps - 2s hold - Heel Raises with Counter Support  - 1 x daily - 3-4 x weekly - 3 sets - 10 reps - 2s hold - Mini Squat with Counter Support  - 1 x daily - 3-4 x weekly - 3 sets - 10 reps - Semi-Tandem Balance at [employer] Eyes Open  - 1 x daily - 7 x weekly - 2-3 sets - 10 reps - 10-30 hold   ASSESSMENT:  CLINICAL IMPRESSION:   Patient arrives to treatment session motivated to participate. Continues to complain of sharp R shin pain. Session focused on BLE strengthening and dynamic balance activities. Sit to stand with ball toss to rebound was added for dynamic balance and functional strengthening. Basketball balance task was added to challenge the pt's balance with external perturbations and the strengthen the pt's anticipatory balance reaction. He will benefit from PT services to address deficits in strength, balance, and mobility in order to return to full function at home and decrease his risk for falls.    OBJECTIVE IMPAIRMENTS: Abnormal gait, decreased balance, difficulty walking, decreased strength, impaired sensation, and pain.   ACTIVITY LIMITATIONS: bending, standing, squatting, stairs, transfers, and caring for others  PARTICIPATION LIMITATIONS: meal prep, cleaning, laundry, shopping, and community activity  PERSONAL FACTORS: Age, Past/current experiences, Time since onset of injury/illness/exacerbation, and 3+ comorbidities: generalized polyneuropathy, COPD, DM, NSTEMI, and spinal stenosis are also affecting patient's functional outcome.   REHAB POTENTIAL: Fair    CLINICAL DECISION MAKING: Unstable/unpredictable  EVALUATION COMPLEXITY: High   GOALS: Goals reviewed with patient? No  SHORT TERM GOALS:  Target date: 07/10/2023  Pt will be independent with HEP in order to improve strength and balance in order to decrease fall risk and improve function at home. Baseline:  Goal status: MET   LONG TERM GOALS: Target date: 08/21/2023  Pt will improve ABC by at least 13% in order to demonstrate clinically significant improvement in balance confidence.  Baseline: 20%; 07/10/23: 30%, 08/13/23: 36.25% Goal status: ACHIEVED  2.  Pt will improve BERG by at least 3 points in order to demonstrate clinically significant improvement in balance.   Baseline: 40/56; 07/10/23: 50/56; 08/13/23: 52/56; Goal status: ACHIEVED  3.  Pt will increase self-selected to at least 0.6 m/s in order to demonstrate clinically significant improvement in limited community ambulation.         Baseline: self-selected: 0.49 m/s; 07/10/23: 0.52 m/s; 08/13/23: 0.49 m/s; Goal status: NOT MET  4. Pt will be able to complete 5TSTS without UE support in order to demonstrate clinically significant improvement in LE strength. Baseline: Unable to perform sit to stand without heavy UE assist; 07/10/23: 11.4s; 08/13/23: 12.72 s Goal status: ACHIEVED  5. Pt will decrease TUG to below 14 seconds/decrease in order to demonstrate decreased fall risk.  Baseline: 22.2s; 07/10/23: 14.0s; 08/13/23: 24.53s no crutch, 16.20s with crutch Goal status: ONGOING  PLAN: PT FREQUENCY: 2x/week  PT DURATION: 12 weeks  PLANNED INTERVENTIONS: Therapeutic exercises, Therapeutic activity, Neuromuscular re-education, Balance training, Gait training, Patient/Family education, Self Care, Joint mobilization, Joint manipulation, Vestibular training, Canalith repositioning, Orthotic/Fit training, DME instructions, Dry Needling, Electrical stimulation, Spinal manipulation, Spinal mobilization, Cryotherapy, Moist heat, Taping, Traction, Ultrasound, Ionotophoresis 4mg /ml Dexamethasone , Manual therapy, and Re-evaluation.  PLAN FOR NEXT SESSION: recertification,  progress strengthening exercises, walking/ambulation speed and endurance tasks, modify/progress HEP as needed;   Vernell Moats, SPT  12:01 PM,08/22/23

## 2023-08-23 ENCOUNTER — Ambulatory Visit

## 2023-08-23 DIAGNOSIS — M6281 Muscle weakness (generalized): Secondary | ICD-10-CM | POA: Diagnosis not present

## 2023-08-23 DIAGNOSIS — R2681 Unsteadiness on feet: Secondary | ICD-10-CM | POA: Diagnosis not present

## 2023-08-23 DIAGNOSIS — M5459 Other low back pain: Secondary | ICD-10-CM | POA: Diagnosis not present

## 2023-08-23 DIAGNOSIS — R262 Difficulty in walking, not elsewhere classified: Secondary | ICD-10-CM | POA: Diagnosis not present

## 2023-08-23 NOTE — Therapy (Signed)
 OUTPATIENT PHYSICAL THERAPY BALANCE TREATMENT/RE-CERTIFICATION NOTE   Patient Name: Sean Reyes. MRN: [id number] DOB:02-12-1941, 83 y.o., male Today's Date: 08/24/2023  END OF SESSION:  PT End of Session - 08/23/23 1413     Visit Number 23    Number of Visits 49    Date for PT Re-Evaluation 11/15/23    Authorization Type eval: 05/29/23;    Authorization Time Period Medicare A&B 2025  VL: Based on [place]  No auth req    PT Start Time 1400    PT Stop Time 1450    PT Time Calculation (min) 50 min    Equipment Utilized During Treatment Gait belt    Activity Tolerance Patient tolerated treatment well;Patient limited by fatigue    Behavior During Therapy WFL for tasks assessed/performed           Past Medical History:  Diagnosis Date   Anemia    Anxiety    Arthritis    Arthritis of neck    Atrial fibrillation (HCC)    Cataracts, bilateral    Complication of anesthesia    pt reports low BP's after surgery at [facility] and difficulty awakening   Depression    Diabetes (HCC)    dx 6-8 yrs ago   Dysrhythmia    a-fib   GERD (gastroesophageal reflux disease)    OCC TAKES ALKA SELTZER   History of kidney stones    10-15 yrs ago   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   Hyperlipidemia    Hypertension    Myocardial infarction (HCC) 12/2020   Nocturia    S/P ablation of atrial fibrillation    Ablative therapy   Sleep apnea    CPAP    Spinal stenosis    Tachycardia, unspecified    Past Surgical History:  Procedure Laterality Date   ABLATION     ANTERIOR LAT LUMBAR FUSION N/A 06/27/2017   Procedure: Anterior Lateral Lumbar Interbody  Fusion - Lumbar Two-Lumbar Three - Lumbar Three-Lumbar Four, Posterior Lumbar Interbody Fusion Lumbar Four- Five;  Surgeon: Onetha Kuba, MD;  Location: [facility] OR;  Service: Neurosurgery;  Laterality: N/A;  Anterior Lateral Lumbar Interbody  Fusion - Lumbar Two-Lumbar Three - Lumbar Three-Lumbar Four, Posterior Lumbar Interbody Fusion  Lumbar Four- Five   BACK SURGERY     CARDIAC CATHETERIZATION     CARDIOVERSION N/A 08/29/2018   Procedure: CARDIOVERSION;  Surgeon: Hester Wolm PARAS, MD;  Location: [facility] ORS;  Service: Cardiovascular;  Laterality: N/A;   CARDIOVERSION N/A 09/24/2018   Procedure: CARDIOVERSION;  Surgeon: Hester Wolm PARAS, MD;  Location: [facility] ORS;  Service: Cardiovascular;  Laterality: N/A;   COLONOSCOPY WITH PROPOFOL  N/A 10/05/2015   Procedure: COLONOSCOPY WITH PROPOFOL ;  Surgeon: Gladis RAYMOND Mariner, MD;  Location: [facility] ENDOSCOPY;  Service: Endoscopy;  Laterality: N/A;   COLONOSCOPY WITH PROPOFOL  N/A 11/01/2020   Procedure: COLONOSCOPY WITH PROPOFOL ;  Surgeon: Therisa Bi, MD;  Location: [facility] ENDOSCOPY;  Service: Gastroenterology;  Laterality: N/A;   CORONARY STENT INTERVENTION N/A 12/22/2020   Procedure: CORONARY STENT INTERVENTION;  Surgeon: Ammon Blunt, MD;  Location: [facility] INVASIVE CV LAB;  Service: Cardiovascular;  Laterality: N/A;   ESOPHAGOGASTRODUODENOSCOPY N/A 11/01/2020   Procedure: ESOPHAGOGASTRODUODENOSCOPY (EGD);  Surgeon: Therisa Bi, MD;  Location: [facility] ENDOSCOPY;  Service: Gastroenterology;  Laterality: N/A;   ESOPHAGOGASTRODUODENOSCOPY (EGD) WITH PROPOFOL  N/A 04/01/2018   Procedure: ESOPHAGOGASTRODUODENOSCOPY (EGD) WITH PROPOFOL ;  Surgeon: Mariner Gladis RAYMOND, MD;  Location: [facility] ENDOSCOPY;  Service: Endoscopy;  Laterality: N/A;   ESOPHAGOGASTRODUODENOSCOPY (  EGD) WITH PROPOFOL  N/A 06/01/2022   Procedure: ESOPHAGOGASTRODUODENOSCOPY (EGD) WITH PROPOFOL ;  Surgeon: Therisa Bi, MD;  Location: [facility] ENDOSCOPY;  Service: Gastroenterology;  Laterality: N/A;   EYE SURGERY     HERNIA REPAIR     JOINT REPLACEMENT Bilateral    hips  RT+  LEFT X2    LEFT HEART CATH AND CORONARY ANGIOGRAPHY N/A 12/22/2020   Procedure: LEFT HEART CATH AND CORONARY ANGIOGRAPHY;  Surgeon: Ammon Blunt, MD;  Location: [facility] INVASIVE CV LAB;  Service: Cardiovascular;  Laterality: N/A;   LEFT HEART CATH AND  CORONARY ANGIOGRAPHY Left 08/23/2021   Procedure: LEFT HEART CATH AND CORONARY ANGIOGRAPHY;  Surgeon: Hester Wolm PARAS, MD;  Location: [facility] INVASIVE CV LAB;  Service: Cardiovascular;  Laterality: Left;   LUMBAR LAMINECTOMY/DECOMPRESSION MICRODISCECTOMY Left 09/13/2016   Procedure: Microdiscectomy - Lumbar two-three,  Lumbar three- - left;  Surgeon: Onetha Kuba, MD;  Location: [facility] OR;  Service: Neurosurgery;  Laterality: Left;   SPINAL CORD STIMULATOR INSERTION  07/08/2019   TONSILLECTOMY     Patient Active Problem List   Diagnosis Date Noted   Lip swelling 07/04/2023   Diabetes mellitus treated with oral medication (HCC) 04/05/2023   Subareolar mass of left breast 10/03/2022   COPD (chronic obstructive pulmonary disease) (HCC) 10/31/2021   Shortness of breath on exertion 08/04/2021   Diabetes mellitus with proteinuria (HCC) 04/04/2021   GERD without esophagitis 04/02/2021   Coronary artery disease 12/28/2020   History of non-ST elevation myocardial infarction (NSTEMI) 12/21/2020   Pain in right shin 10/25/2020   Peripheral vascular disease (HCC) 08/06/2020   Chronic pain syndrome 07/06/2020   Cervical facet joint syndrome 04/08/2020   Spinal cord stimulator status 12/11/2019   Elevated TSH 08/20/2019   CKD (chronic kidney disease) stage 3, GFR 30-59 ml/min (HCC) 01/19/2019   Acquired thrombophilia (HCC) 01/19/2019   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   Postlaminectomy syndrome, lumbar region 01/16/2019   History of fusion of lumbar spine (L2-L5) 01/16/2019   Chronic radicular lumbar pain 01/16/2019   HNP (herniated nucleus pulposus), lumbar 04/29/2018   Advanced care planning/counseling discussion 09/28/2016   Spinal stenosis, lumbar region, 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/13/2016   Hyperlipidemia associated with type 2 diabetes mellitus (HCC) 07/14/2015   Symptomatic anemia 06/30/2015   Benign prostatic hyperplasia without lower urinary tract symptoms 06/02/2015   OSA  (obstructive sleep apnea) 03/23/2015   Hypertension associated with diabetes (HCC) 09/28/2014   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HCC) 09/28/2014   H/O prior ablation treatment 10/19/2011   AF (paroxysmal atrial fibrillation) (HCC) 10/19/2011   PCP: Valerio Melanie DASEN, NP  REFERRING PROVIDER: Maree Jannett POUR, MD   REFERRING DIAG: Peripheral Neuropathy  RATIONALE FOR EVALUATION AND TREATMENT: Rehabilitation  THERAPY DIAG: Muscle weakness (generalized)  Difficulty in walking, not elsewhere classified  Unsteadiness on feet  ONSET DATE: 01/25/23 (acute on chronic)  FOLLOW-UP APPT SCHEDULED WITH REFERRING PROVIDER: Yes   FROM INITIAL EVALUATION SUBJECTIVE:  SUBJECTIVE STATEMENT:  Imbalance and LE weakness;  PERTINENT HISTORY:  Lower extremity weakness in patient with 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 He reports weakness in both legs and numbness in toes post surgery in 2018. He experienced a fall on 01/25/2023 at home, resulting in a L hip injury. No residual pain in hip at this time. No additional falls since December. He reports progressive LE weakness impairing his functional ability at home. He is known to this clinic from prior episodes of care for similar concerns.  Prior history: 11/02/22 Patient reports to physical therapy today with a chief concern of muscle weakness and imbalance. Patient reports progressive worsening in balance and muscular strength. He states that he  has loss of sensation in his toes he reports they are asleep. Patient reports pain in the anterior thigh and bilateral calves. He currently uses an loftstand crutch in his RUE. Lately patient has reported difficulty with dressing LE, performing fine motor movements with left hand. He denies falls, saddle parasthesia, nausea, vomitting, night sweats.    10/17/2022 Patient reported he is not doing well lately. He has been seeing Dr. Clois. He reports his legs are so weak that he can hardly walk, so he has been using a crutch for the last 1-2 years. Reported numbness and loss of sensation in his toes. He also reported that recently his left hand has began to has some numbness to where it feels like it is asleep. Denies pain, loss of sensation, coldness, burning. Denies falls.  Imaging: NCS conducted on 11/12/2018: Abnormal study. 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 There is also preliminary evidence of a superimposed left lower lumbosacral polyradiculopathy, based on NCV and distal needle exam results. I cannot test lumbar paraspinals due to patient on Eliquis .    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 Yes. 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  Recent memory is intact.  Remote memory is intact.  Attention span and concentration are intact.  Expressive speech is intact.  Patient's fund of knowledge is  within normal limits for educational level.    Gross Musculoskeletal Assessment Tremor: None Bulk: Normal Tone: Normal  Posture: Forward head and rounded shoulders  AROM Deferred specific measurements. Functional motion intact;  LE MMT: MMT (out of 5) Right  Left   Hip flexion 4 4+  Hip extension    Hip abduction (seated) 3+ 3+  Hip adduction (seated 3+ 3+  Hip internal rotation    Hip external rotation    Knee flexion (seated) 4 4  Knee extension 4 4  Ankle dorsiflexion 4 4  Ankle plantarflexion    Ankle inversion    Ankle eversion    (* = pain; Blank rows = not tested)  Transfers: Assistive device utilized: Single Lofstrand RUE  Sit to stand: Modified independence Stand to sit: Modified independence Chair to chair: Modified independence Floor: Deferred  Stairs: Level of Assistance: CGA Stair Negotiation Technique: Alternating Pattern  with Bilateral Rails Number of Stairs: 4  Height of Stairs: 6  Comments: Slow and labored ascending/descending. No overt LOB;  Gait: Gait pattern: decreased step length- Right, decreased step length- Left, trunk flexed, poor foot clearance- Right, and poor foot clearance- Left Distance walked:  150' over the course of evaluation Assistive device utilized: Single Lofstrand in RUE Level of assistance: Modified independence Comments: Slow and labored ambulation. Decreased self selected speed  Functional Outcome Measures  Results Comments  BERG 40/56 Increased fall risk  DGI    FGA    TUG 22.2 seconds Increased fall risk  5TSTS Unable Increased fall risk  6 Minute Walk Test    10 Meter Gait Speed Self-selected: 20.5s = 0.49 m/s; Fastest: 18.0s = 0.56 m/s Below community ambulation speed  (Blank rows = not tested)   TODAY'S TREATMENT: 08/24/23    SUBJECTIVE: Patient reports ongoing BLE weakness and R shin pain from previous injury 30+ years ago. No falls or stumbles since last session. Pt has no new concerns or questions.   PAIN: Chronic low back pain. Sharp R shin pain.    Therapeutic Activity NuStep (seat 12, hands 13) L1-4 x 10 minutes, for BLE strengthening and warm-up during interval history (5 mins unbilled);  Side stepping with 3# AW x multiple lengths; Forward 6 step-ups alternating leading LE x 15 each; SL step ups on 6 steps x 15 ea LE;  Calf raises at stairs 2 x 20;  Mini squats at stairs 2 x 20; Sit to stand with 6 # ball chest press to rebounder 2 x 10; Seated LAQ with 3# ankle weights x 15 BLE;  Standing on AE pad, Alternating Basketball chest press and bounce pass with PT, 2 x 3'  Not Performed this session: Standing hip strengthening with 3# ankle weights (AW): Hip flexion marches x 15 BLE; Hip abduction x 15 BLE; Fwd step over 6 hurdles in // bars x 3 laps    PATIENT EDUCATION:  Education details: Pt educated throughout session about proper posture and technique with exercises. Improved exercise technique, movement at target joints, use of target muscles after min to mod verbal, visual, tactile cues.  Person educated: Patient Education method: Explanation, Verbal cues, and Handouts Education comprehension: verbalized understanding and returned  demonstration   HOME EXERCISE PROGRAM:  Access Code: [email or web address] URL: [email or web address] Date: 05/31/2023 Prepared by: Selinda Eck  Exercises - Seated Long Arc Quad  - 1 x daily - 3-4 x weekly - 3 sets - 10 reps - 2s hold - Seated Hip Abduction with Resistance  - 1 x daily -  3-4 x weekly - 3 sets - 10 reps - 2s hold - Heel Raises with Counter Support  - 1 x daily - 3-4 x weekly - 3 sets - 10 reps - 2s hold - Mini Squat with Counter Support  - 1 x daily - 3-4 x weekly - 3 sets - 10 reps - Semi-Tandem Balance at [employer] Eyes Open  - 1 x daily - 7 x weekly - 2-3 sets - 10 reps - 10-30 hold   ASSESSMENT:  CLINICAL IMPRESSION:   Patient arrives to treatment session very motivated to participate. He has made significant progress since his initial evaluation. His BERG balance score improved from 40/56 at the initial evaluation, 50/56 on 5/27, to 52/56 on 6/30. His balance confidence increased from 20% initially, 30% on 5/27, to 36% on 6/30. However, he still has considerable room for improvement in his balance confidence. He has demonstrated maintenance of his BLE strength evidenced by his 5TSTS and self-selected gait speed. His 72m fast gait speed improved but his self-selected and fast speeds are both still below the required speed for safe community ambulation. Continues to complain of sharp R shin pain. Session focused on BLE strengthening and dynamic balance activities. Sit to stand with ball toss to rebound was continued for dynamic balance and functional strengthening. Basketball balance task was done again this session to challenge the pt's balance with external perturbations and to strengthen the pt's anticipatory balance reaction. Pt demonstrated appropriate effort throughout the session and had no increase in sx. He will benefit from PT services to address deficits in strength, balance, and mobility in order to return to full function at home and decrease his risk for  falls.     OBJECTIVE IMPAIRMENTS: Abnormal gait, decreased balance, difficulty walking, decreased strength, impaired sensation, and pain.   ACTIVITY LIMITATIONS: bending, standing, squatting, stairs, transfers, and caring for others  PARTICIPATION LIMITATIONS: meal prep, cleaning, laundry, shopping, and community activity  PERSONAL FACTORS: Age, Past/current experiences, Time since onset of injury/illness/exacerbation, and 3+ comorbidities: generalized polyneuropathy, COPD, DM, NSTEMI, and spinal stenosis are also affecting patient's functional outcome.   REHAB POTENTIAL: Fair    CLINICAL DECISION MAKING: Unstable/unpredictable  EVALUATION COMPLEXITY: High   GOALS: Goals reviewed with patient? No  SHORT TERM GOALS: Target date: 07/10/2023  Pt will be independent with HEP in order to improve strength and balance in order to decrease fall risk and improve function at home. Baseline:  Goal status: MET   LONG TERM GOALS: Target date: 11/15/2023  Pt will improve ABC by at least 13% in order to demonstrate clinically significant improvement in balance confidence.  Baseline: 20%; 07/10/23: 30%, 08/13/23: 36.25% Goal status: ACHIEVED  2.  Pt will improve BERG by at least 3 points in order to demonstrate clinically significant improvement in balance.   Baseline: 40/56; 07/10/23: 50/56; 08/13/23: 52/56; Goal status: ACHIEVED  3.  Pt will increase self-selected to at least 0.6 m/s in order to demonstrate clinically significant improvement in limited community ambulation.         Baseline: self-selected: 0.49 m/s; 07/10/23: 0.52 m/s; 08/13/23: 0.49 m/s; Goal status: NOT MET  4. Pt will be able to complete 5TSTS without UE support in order to demonstrate clinically significant improvement in LE strength. Baseline: Unable to perform sit to stand without heavy UE assist; 07/10/23: 11.4s; 08/13/23: 12.72 s Goal status: ACHIEVED  5. Pt will decrease TUG to below 14 seconds/decrease in order  to demonstrate decreased fall risk.  Baseline: 22.2s;  07/10/23: 14.0s; 08/13/23: 24.53s no crutch, 16.20s with crutch Goal status: ONGOING  PLAN: PT FREQUENCY: 2x/week  PT DURATION: 12 weeks  PLANNED INTERVENTIONS: Therapeutic exercises, Therapeutic activity, Neuromuscular re-education, Balance training, Gait training, Patient/Family education, Self Care, Joint mobilization, Joint manipulation, Vestibular training, Canalith repositioning, Orthotic/Fit training, DME instructions, Dry Needling, Electrical stimulation, Spinal manipulation, Spinal mobilization, Cryotherapy, Moist heat, Taping, Traction, Ultrasound, Ionotophoresis 4mg /ml Dexamethasone , Manual therapy, and Re-evaluation.  PLAN FOR NEXT SESSION: progress strengthening exercises, walking/ambulation speed and endurance tasks, modify/progress HEP as needed;   Jason D Huprich PT, DPT, GCS  Vernell Moats, SPT  11:43 AM,08/24/23

## 2023-08-28 ENCOUNTER — Ambulatory Visit
Attending: Student in an Organized Health Care Education/Training Program | Admitting: Student in an Organized Health Care Education/Training Program

## 2023-08-28 ENCOUNTER — Encounter: Payer: Self-pay | Admitting: Student in an Organized Health Care Education/Training Program

## 2023-08-28 ENCOUNTER — Ambulatory Visit

## 2023-08-28 VITALS — BP 125/65 | HR 72 | Temp 98.1°F | Resp 18 | Ht 73.0 in | Wt 187.0 lb

## 2023-08-28 DIAGNOSIS — M7918 Myalgia, other site: Secondary | ICD-10-CM | POA: Diagnosis not present

## 2023-08-28 DIAGNOSIS — G5781 Other specified mononeuropathies of right lower limb: Secondary | ICD-10-CM | POA: Diagnosis not present

## 2023-08-28 DIAGNOSIS — M898X6 Other specified disorders of bone, lower leg: Secondary | ICD-10-CM

## 2023-08-28 NOTE — Progress Notes (Signed)
 PROVIDER NOTE: Interpretation of information contained herein should be left to medically-trained personnel. Specific patient instructions are provided elsewhere under Patient Instructions section of medical record. This document was created in part using AI and STT-dictation technology, any transcriptional errors that may result from this process are unintentional.  Patient: Sean Settlemire Jr.  Service: E/M   PCP: Valerio Melanie DASEN, NP  DOB: 1940/07/15  DOS: 08/28/2023  Provider: Wallie Sherry, MD  MRN: [id number]  Delivery: Face-to-face  Specialty: Interventional Pain Management  Type: Established Patient  Setting: Ambulatory outpatient facility  Specialty designation: 09  Referring Prov.: Valerio Melanie DASEN, NP  Location: Outpatient office facility       History of present illness (HPI) Mr. Sean Enriquez., a 83 y.o. 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 Sean Reyes primary complain today is Leg Pain (Right top of leg, ran into trailer hitch 30 years)  Pertinent problems: Sean Reyes has H/O prior ablation treatment; AF (paroxysmal atrial fibrillation) (HCC); Spinal stenosis, lumbar region, with neurogenic claudication; HNP (herniated nucleus pulposus), lumbar; Failed back surgical syndrome; Postlaminectomy syndrome, lumbar region; History of fusion of lumbar spine (L2-L5); Chronic radicular lumbar pain; and Spinal cord stimulator status on their pertinent problem list.  Pain Assessment: Severity of Chronic pain is reported as a 4 /10. Location: Leg Right/Denies. Onset: More than a month ago. Quality: Aching, Sharp. Timing: Intermittent. Modifying factor(s): Nothing, had a cauterization 20+ years ago. Vitals:  height is 6' 1 (1.854 m) and weight is 187 lb (84.8 kg). His temperature is 98.1 F (36.7 C). His blood pressure is 125/65 and his pulse is 72. His respiration is 18 and oxygen saturation is 99%.  BMI: Estimated body mass index is 24.67 kg/m as  calculated from the following:   Height as of this encounter: 6' 1 (1.854 m).   Weight as of this encounter: 187 lb (84.8 kg).  Last encounter: 09/21/2022. Last procedure: Visit date not found.  Reason for encounter:   Discussed the use of AI scribe software for clinical note transcription with the patient, who gave verbal consent to proceed.  History of Present Illness   Sean Suchy. is an 83 year old male with chronic back pain who presents with persistent right tibial leg pain and consideration of a peripheral nerve stimulator.  He has a history of chronic back pain and nerve issues, for which he has a spinal cord stimulator in place. Despite its use, his back pain persists. He is currently undergoing physical therapy for his legs.  He presents with persistent pain in his right leg, described as localized and severe, sometimes feeling like 'somebody hits me with a baseball bat.' The pain is confined to one area and does not radiate.  It overlies the cutaneous branches of his saphenous nerve along his medial leg overlying his mid tibia.  It can be as severe as a nine on a scale of one to ten, particularly at night when lying down. The area is very sensitive to touch, causing him to 'jump out of this chair' when touched.  Years ago, he consulted a neurologist who suggested that the pain might be due to nerve damage from a past injury involving a trailer hitch. The neurologist speculated that the nerves grew back incorrectly. He previously received injections from Dr. Adrianne, which provided temporary relief, but the pain has since returned.        ROS  Constitutional: Denies any fever or chills Gastrointestinal:  No reported hemesis, hematochezia, vomiting, or acute GI distress Musculoskeletal: Right anterior tibial pain Neurological: No reported episodes of acute onset apraxia, aphasia, dysarthria, agnosia, amnesia, paralysis, loss of coordination, or loss of  consciousness  Medication Review  EPINEPHrine , amLODipine , apixaban , benazepril , cyanocobalamin , ferrous sulfate, finasteride , metFORMIN , omeprazole , prednisoLONE acetate, rosuvastatin , solifenacin , tamsulosin , and umeclidinium-vilanterol  History Review  Allergy: Sean Reyes is allergic to levaquin [levofloxacin in d5w], shellfish allergy, amiodarone, and adhesive [tape]. Drug: Sean Reyes  reports no history of drug use. Alcohol:  reports no history of alcohol use. Tobacco:  reports that he has never smoked. He has never used smokeless tobacco. Social: Sean Reyes  reports that he has never smoked. He has never used smokeless tobacco. He reports that he does not drink alcohol and does not use drugs. Medical:  has a past medical history of Anemia, Anxiety, Arthritis, Arthritis of neck, Atrial fibrillation (HCC), Cataracts, bilateral, Complication of anesthesia, Depression, Diabetes (HCC), Dysrhythmia, GERD (gastroesophageal reflux disease), History of kidney stones, HOH (hard of hearing), Hyperlipidemia, Hypertension, Myocardial infarction (HCC) (12/2020), Nocturia, S/P ablation of atrial fibrillation, Sleep apnea, Spinal stenosis, and Tachycardia, unspecified. Surgical: Sean Reyes  has a past surgical history that includes Tonsillectomy; Hernia repair; Ablation; Colonoscopy with propofol  (N/A, 10/05/2015); Lumbar laminectomy/decompression microdiscectomy (Left, 09/13/2016); Anterior lat lumbar fusion (N/A, 06/27/2017); Joint replacement (Bilateral); Back surgery; Esophagogastroduodenoscopy (egd) with propofol  (N/A, 04/01/2018); Cardioversion (N/A, 08/29/2018); Cardioversion (N/A, 09/24/2018); Spinal cord stimulator insertion (07/08/2019); Colonoscopy with propofol  (N/A, 11/01/2020); Esophagogastroduodenoscopy (N/A, 11/01/2020); LEFT HEART CATH AND CORONARY ANGIOGRAPHY (N/A, 12/22/2020); CORONARY STENT INTERVENTION (N/A, 12/22/2020); LEFT HEART CATH AND CORONARY ANGIOGRAPHY (Left, 08/23/2021); Cardiac  catheterization; Eye surgery; and Esophagogastroduodenoscopy (egd) with propofol  (N/A, 06/01/2022). Family: family history includes Brain cancer in his mother; Other in his father.  Laboratory Chemistry Profile   Renal Lab Results  Component Value Date   BUN 21 07/04/2023   CREATININE 1.32 (H) 07/04/2023   BCR 16 07/04/2023   GFRAA 64 02/26/2020   GFRNONAA 53 (L) 04/13/2023    Hepatic Lab Results  Component Value Date   AST 15 07/04/2023   ALT 15 07/04/2023   ALBUMIN 4.0 07/04/2023   ALKPHOS 73 07/04/2023   LIPASE 29 05/25/2022    Electrolytes Lab Results  Component Value Date   NA 135 07/04/2023   K 4.6 07/04/2023   CL 98 07/04/2023   CALCIUM  9.4 07/04/2023   MG 1.6 01/02/2022    Bone Lab Results  Component Value Date   VD25OH 39.8 04/25/2021   TESTOFREE 5.6 (L) 06/17/2021   TESTOSTERONE  400 06/17/2021    Inflammation (CRP: Acute Phase) (ESR: Chronic Phase) No results found for: CRP, ESRSEDRATE, LATICACIDVEN       Note: Above Lab results reviewed.  Recent Imaging Review  NM PET CT CARDIAC PERFUSION MULTI W/ABSOLUTE BLOODFLOW   The study is normal. The study is low risk.   LV perfusion is normal. There is no evidence of ischemia. There is no  evidence of infarction.   Rest left ventricular function is normal. Rest EF: 62%. Stress left  ventricular function is normal. Stress EF: 69%. End diastolic cavity size  is normal. End systolic cavity size is normal. No evidence of transient  ischemic dilation (TID) noted.   Myocardial blood flow was computed to be 1.27ml/g/min at rest and  2.10ml/g/min at stress. Global myocardial blood flow reserve was 1.75 and  was normal due to high resting flows which artificially lower the CFR.  With stress flows >2, findings are likely normal.  Coronary calcium  assessment not performed due to prior  revascularization.   Electronically signed by: Soyla DELENA Merck, MD  EXAM: OVER-READ INTERPRETATION CARDIAC CT CHEST  The  following report is an over-read performed by radiologist Dr. Ryan Salvage of [facility] on 07/05/2023. This over-read does not include interpretation of cardiac or coronary anatomy or pathology. The cardiac and PET interpretation by the cardiologist is attached or will be attached.  COMPARISON:  11/01/2021  FINDINGS: Extracardiac Vascular: Atherosclerotic thoracic aorta.  Partial anomalous pulmonary venous return is observed, with left upper lobe pulmonary vessels draining to a left-sided mediastinal vein which extends into the brachiocephalic vein.  Mediastinum: Unremarkable  Lung: Stable 5 by 4 mm right lower lobe nodule along the major fissure, image 22 series 4, no change from 11/11/2021. This is a benign subpleural lymph node and warrants no further imaging workup.  Scarring in the posterior basal segment left lower lobe.  Included Upper Abdomen: Abdominal aortic atherosclerosis.  Musculoskeletal: Thoracic spondylosis. Grade 1 degenerative retrolisthesis at T11-12. Dorsal column stimulator noted with electrodes less well seen compared to the 2023 exam, but extending as cephalad as the T7-8 level.  IMPRESSION: 1. 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 2. Thoracic spondylosis. 3. Dorsal column stimulator. 4.  Aortic Atherosclerosis (ICD10-I70.0).  Electronically Signed   By: Ryan Salvage M.D.   On: 07/05/2023 13:53 Note: Reviewed        Physical Exam  Vitals: BP 125/65   Pulse 72   Temp 98.1 F (36.7 C)   Resp 18   Ht 6' 1 (1.854 m)   Wt 187 lb (84.8 kg)   SpO2 99%   BMI 24.67 kg/m  BMI: Estimated body mass index is 24.67 kg/m as calculated from the following:   Height as of this encounter: 6' 1 (1.854 m).   Weight as of this encounter: 187 lb (84.8 kg). Ideal: Ideal body weight: 79.9 kg (176 lb 2.4 oz) Adjusted ideal body weight: 81.9 kg (180 lb 7.8 oz) General appearance: Well nourished, well developed, and  well hydrated. In no apparent acute distress Mental status: Alert, oriented x 3 (person, place, & time)       Respiratory: No evidence of acute respiratory distress Eyes: PERLA  Right anterior tibial pain  Assessment   Diagnosis Status  1. Saphenous nerve neuropathy, right   2. Pain of right tibia   3. Myofascial pain    Controlled Controlled Controlled   Updated Problems: No problems updated.  Plan of Care  Problem-specific:  Assessment and Plan    Cutaneous nerve pain in right leg   Chronic cutaneous nerve pain in the right leg is likely due to superficial nerve damage from past trauma. The pain is severe, rated 9/10, and described as feeling like being hit with a baseball bat. Previous interventions, including cauterization, provided only temporary relief. Pain worsens with touch and movement, indicating cutaneous nerve involvement. Schedule a nerve block procedure under ultrasound guidance to assess response. Administer local anesthetic and steroid injection during the procedure. Consider nerve ablation if the nerve block provides significant pain relief.  Chronic back pain   Chronic back pain persists with unclear relief from the current stimulator, suggesting a possible progressive condition. The effectiveness of the stimulator is difficult to assess due to the progressive nature of the back pain.       Mr. Miqueas Whilden. has a current medication list which includes the following long-term medication(s): amlodipine , benazepril , eliquis , metformin , omeprazole , and rosuvastatin .  Pharmacotherapy (Medications Ordered): No orders of the defined types were placed in this encounter.  Orders:  Orders Placed This Encounter  Procedures   FEMORAL NERVE BLOCK    Standing Status:   Future    Expiration Date:   11/28/2023    Scheduling Instructions:     Right saphenous tibial nerve block    Where will this procedure be performed?:   [facility]     Status post  caudal 02/03/2019: 6 cc injected- not helpful, status post Nevro spinal cord stimulator implant with Dr. Daril at [facility]           Return in about 20 days (around 09/17/2023) for Right tibial saphenous nerve block, in clinic [facility].    Recent Visits No visits were found meeting these conditions. Showing recent visits within past 90 days and meeting all other requirements Today's Visits Date Type Provider Dept  08/28/23 Office Visit Marcelino Nurse, MD [facility]  Showing today's visits and meeting all other requirements Future Appointments No visits were found meeting these conditions. Showing future appointments within next 90 days and meeting all other requirements  I discussed the assessment and treatment plan with the patient. The patient was provided an opportunity to ask questions and all were answered. The patient agreed with the plan and demonstrated an understanding of the instructions.  Patient advised to call back or seek an in-person evaluation if the symptoms or condition worsens.  Duration of encounter: .  Total time on encounter, as per AMA guidelines included both the face-to-face and non-face-to-face time personally spent by the physician and/or other qualified health care professional(s) on the day of the encounter (includes time in activities that require the physician or other qualified health care professional and does not include time in activities normally performed by clinical staff). Physician's time may include the following activities when performed: Preparing to see the patient (e.g., pre-charting review of records, searching for previously ordered imaging, lab work, and nerve conduction tests) Review of prior analgesic pharmacotherapies. Reviewing PMP Interpreting ordered tests (e.g., lab work, imaging, nerve conduction tests) Performing post-procedure evaluations, including interpretation of diagnostic procedures Obtaining and/or reviewing separately  obtained history Performing a medically appropriate examination and/or evaluation Counseling and educating the patient/family/caregiver Ordering medications, tests, or procedures Referring and communicating with other health care professionals (when not separately reported) Documenting clinical information in the electronic or other health record Independently interpreting results (not separately reported) and communicating results to the patient/ family/caregiver Care coordination (not separately reported)  Note by: Nurse Marcelino, MD (TTS and AI technology used. I apologize for any typographical errors that were not detected and corrected.) Date: 08/28/2023; Time: 2:16 PM

## 2023-08-28 NOTE — Progress Notes (Signed)
 Safety precautions to be maintained throughout the outpatient stay will include: orient to surroundings, keep bed in low position, maintain call bell within reach at all times, provide assistance with transfer out of bed and ambulation.

## 2023-08-28 NOTE — Patient Instructions (Signed)
Lateral Femoral Cutaneous Nerve Block Patient Information  Description: The lateral femoral cutaneous nerve of the thigh is a purely sensory nerve that can become entrapped or irritated for a number of reasons.  The pain associated with this condition is called meralgia paraesthetica.  Patients affected with this syndrome have burning pain or abnormal sensation along the lateral aspect of the thigh.  The pain can be worsened by prolonged walking, standing, or constrictive garments around the house.   The diagnosis can be confirmed and treatment initiated by blocking the nerve with local anesthetic (like Novocaine).  At times, a steroid solution may be injected at the same time.  The site of injection is through a tiny needle in the left, lower quadrant of the abdomen.   The entire block usually lasts less than 5 minutes.  Conditions which may be treated by lateral femoral cutaneous nerve block:  Meralagia paraesthetica  Preparation for the injection:  Do not eat any solid food or dairy products within 8 hours of your appointment.  You may drink clear liquids up to 3 hours before appointment.  Clear liquids include water, black coffee, juice or soda. No milk or cream please. You may take your regular medication, including pain medications, with a sip of water before your appointment.  Diabetics should hold regular insulin (if taken separately) and take 1/2 normal NPH dose the morning of the procedure.  Carry some sugar containing items with you to your appointment. A driver must accompany you and be prepared to drive you home after your procedure. Bring all you current medications with you An IV may be inserted and sedation may be given at the discretion of the physician. A blood pressure cuff, EKG and other monitors will often be applied during the procedure.  Some patients may need to have extra oxygen administered for a short period. You will be asked to provide medical information, including  your allergies and medications, prior to the procedure.  We must know immediately if you are taking blood thinners (like Coumadin/Warfarin) or if you allergic to IV iodine contrast (dye)  We must know if you could possible be pregnant.   Possible side-effects:  Bleeding from needle site Infection (rate, may require surgery) Nerve injury (rare) Numbness and Tingling (temporary) Light-headedness (temporary) Pain at injection site (several day) Decreased blood pressure (rare, temporary) Weakness in leg (temporary)  Call if you experience: Hives or difficulty breathing (go to the emergency room) Inflammation or drainage at the injection site(s)  Please note:  Although the local anesthetic injected can often make your leg feel good for several hours after the injection,  The pain may return.  It takes 3-7 days for steroids to work.  You may not notice any pain relief for at least one week.  If effective, we will often do a series of injections spaced 3-6 weeks apart to maximally decrease your pain.  If you have any questions, please cll [phone] [facility]   

## 2023-08-30 ENCOUNTER — Ambulatory Visit

## 2023-08-30 DIAGNOSIS — R262 Difficulty in walking, not elsewhere classified: Secondary | ICD-10-CM

## 2023-08-30 DIAGNOSIS — M6281 Muscle weakness (generalized): Secondary | ICD-10-CM

## 2023-08-30 DIAGNOSIS — M5459 Other low back pain: Secondary | ICD-10-CM | POA: Diagnosis not present

## 2023-08-30 DIAGNOSIS — R2681 Unsteadiness on feet: Secondary | ICD-10-CM | POA: Diagnosis not present

## 2023-08-30 NOTE — Therapy (Unsigned)
 OUTPATIENT PHYSICAL THERAPY BALANCE TREATMENT NOTE   Patient Name: Sean Reyes. MRN: [id number] DOB:June 08, 1940, 83 y.o., male Today's Date: 08/31/2023  END OF SESSION:  PT End of Session - 08/30/23 1418     Visit Number 24    Number of Visits 49    Date for PT Re-Evaluation 11/15/23    Authorization Type eval: 05/29/23;    Authorization Time Period Medicare A&B 2025  VL: Based on MN  No auth req    PT Start Time 1400    PT Stop Time 1450    PT Time Calculation (min) 50 min    Equipment Utilized During Treatment Gait belt    Activity Tolerance Patient tolerated treatment well;Patient limited by fatigue    Behavior During Therapy WFL for tasks assessed/performed         Past Medical History:  Diagnosis Date   Anemia    Anxiety    Arthritis    Arthritis of neck    Atrial fibrillation (HCC)    Cataracts, bilateral    Complication of anesthesia    pt reports low BP's after surgery at [facility] and difficulty awakening   Depression    Diabetes (HCC)    dx 6-8 yrs ago   Dysrhythmia    a-fib   GERD (gastroesophageal reflux disease)    OCC TAKES ALKA SELTZER   History of kidney stones    10-15 yrs ago   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   Hyperlipidemia    Hypertension    Myocardial infarction (HCC) 12/2020   Nocturia    S/P ablation of atrial fibrillation    Ablative therapy   Sleep apnea    CPAP    Spinal stenosis    Tachycardia, unspecified    Past Surgical History:  Procedure Laterality Date   ABLATION     ANTERIOR LAT LUMBAR FUSION N/A 06/27/2017   Procedure: Anterior Lateral Lumbar Interbody  Fusion - Lumbar Two-Lumbar Three - Lumbar Three-Lumbar Four, Posterior Lumbar Interbody Fusion Lumbar Four- Five;  Surgeon: Onetha Kuba, MD;  Location: [facility] OR;  Service: Neurosurgery;  Laterality: N/A;  Anterior Lateral Lumbar Interbody  Fusion - Lumbar Two-Lumbar Three - Lumbar Three-Lumbar Four, Posterior Lumbar Interbody Fusion Lumbar Four- Five    BACK SURGERY     CARDIAC CATHETERIZATION     CARDIOVERSION N/A 08/29/2018   Procedure: CARDIOVERSION;  Surgeon: Hester Wolm PARAS, MD;  Location: [facility] ORS;  Service: Cardiovascular;  Laterality: N/A;   CARDIOVERSION N/A 09/24/2018   Procedure: CARDIOVERSION;  Surgeon: Hester Wolm PARAS, MD;  Location: [facility] ORS;  Service: Cardiovascular;  Laterality: N/A;   COLONOSCOPY WITH PROPOFOL  N/A 10/05/2015   Procedure: COLONOSCOPY WITH PROPOFOL ;  Surgeon: Gladis RAYMOND Mariner, MD;  Location: [facility] ENDOSCOPY;  Service: Endoscopy;  Laterality: N/A;   COLONOSCOPY WITH PROPOFOL  N/A 11/01/2020   Procedure: COLONOSCOPY WITH PROPOFOL ;  Surgeon: Therisa Bi, MD;  Location: [facility] ENDOSCOPY;  Service: Gastroenterology;  Laterality: N/A;   CORONARY STENT INTERVENTION N/A 12/22/2020   Procedure: CORONARY STENT INTERVENTION;  Surgeon: Ammon Blunt, MD;  Location: [facility] INVASIVE CV LAB;  Service: Cardiovascular;  Laterality: N/A;   ESOPHAGOGASTRODUODENOSCOPY N/A 11/01/2020   Procedure: ESOPHAGOGASTRODUODENOSCOPY (EGD);  Surgeon: Therisa Bi, MD;  Location: [facility] ENDOSCOPY;  Service: Gastroenterology;  Laterality: N/A;   ESOPHAGOGASTRODUODENOSCOPY (EGD) WITH PROPOFOL  N/A 04/01/2018   Procedure: ESOPHAGOGASTRODUODENOSCOPY (EGD) WITH PROPOFOL ;  Surgeon: Mariner Gladis RAYMOND, MD;  Location: [facility] ENDOSCOPY;  Service: Endoscopy;  Laterality: N/A;   ESOPHAGOGASTRODUODENOSCOPY (EGD) WITH  PROPOFOL  N/A 06/01/2022   Procedure: ESOPHAGOGASTRODUODENOSCOPY (EGD) WITH PROPOFOL ;  Surgeon: Therisa Bi, MD;  Location: [facility] ENDOSCOPY;  Service: Gastroenterology;  Laterality: N/A;   EYE SURGERY     HERNIA REPAIR     JOINT REPLACEMENT Bilateral    hips  RT+  LEFT X2    LEFT HEART CATH AND CORONARY ANGIOGRAPHY N/A 12/22/2020   Procedure: LEFT HEART CATH AND CORONARY ANGIOGRAPHY;  Surgeon: Ammon Blunt, MD;  Location: [facility] INVASIVE CV LAB;  Service: Cardiovascular;  Laterality: N/A;   LEFT HEART CATH AND CORONARY ANGIOGRAPHY Left  08/23/2021   Procedure: LEFT HEART CATH AND CORONARY ANGIOGRAPHY;  Surgeon: Hester Wolm PARAS, MD;  Location: [facility] INVASIVE CV LAB;  Service: Cardiovascular;  Laterality: Left;   LUMBAR LAMINECTOMY/DECOMPRESSION MICRODISCECTOMY Left 09/13/2016   Procedure: Microdiscectomy - Lumbar two-three,  Lumbar three- - left;  Surgeon: Onetha Kuba, MD;  Location: [facility] OR;  Service: Neurosurgery;  Laterality: Left;   SPINAL CORD STIMULATOR INSERTION  07/08/2019   TONSILLECTOMY     Patient Active Problem List   Diagnosis Date Noted   Lip swelling 07/04/2023   Diabetes mellitus treated with oral medication (HCC) 04/05/2023   Subareolar mass of left breast 10/03/2022   COPD (chronic obstructive pulmonary disease) (HCC) 10/31/2021   Shortness of breath on exertion 08/04/2021   Diabetes mellitus with proteinuria (HCC) 04/04/2021   GERD without esophagitis 04/02/2021   Coronary artery disease 12/28/2020   History of non-ST elevation myocardial infarction (NSTEMI) 12/21/2020   Pain in right shin 10/25/2020   Peripheral vascular disease (HCC) 08/06/2020   Chronic pain syndrome 07/06/2020   Cervical facet joint syndrome 04/08/2020   Spinal cord stimulator status 12/11/2019   Elevated TSH 08/20/2019   CKD (chronic kidney disease) stage 3, GFR 30-59 ml/min (HCC) 01/19/2019   Acquired thrombophilia (HCC) 01/19/2019   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   Postlaminectomy syndrome, lumbar region 01/16/2019   History of fusion of lumbar spine (L2-L5) 01/16/2019   Chronic radicular lumbar pain 01/16/2019   HNP (herniated nucleus pulposus), lumbar 04/29/2018   Advanced care planning/counseling discussion 09/28/2016   Spinal stenosis, lumbar region, 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/13/2016   Hyperlipidemia associated with type 2 diabetes mellitus (HCC) 07/14/2015   Symptomatic anemia 06/30/2015   Benign prostatic hyperplasia without lower urinary tract symptoms 06/02/2015   OSA (obstructive sleep apnea)  03/23/2015   Hypertension associated with diabetes (HCC) 09/28/2014   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HCC) 09/28/2014   H/O prior ablation treatment 10/19/2011   AF (paroxysmal atrial fibrillation) (HCC) 10/19/2011   PCP: Valerio Melanie DASEN, NP  REFERRING PROVIDER: Maree Jannett POUR, MD   REFERRING DIAG: Peripheral Neuropathy  RATIONALE FOR EVALUATION AND TREATMENT: Rehabilitation  THERAPY DIAG: Muscle weakness (generalized)  Difficulty in walking, not elsewhere classified  Unsteadiness on feet  ONSET DATE: 01/25/23 (acute on chronic)  FOLLOW-UP APPT SCHEDULED WITH REFERRING PROVIDER: Yes   FROM INITIAL EVALUATION SUBJECTIVE:  SUBJECTIVE STATEMENT:  Imbalance and LE weakness;  PERTINENT HISTORY:  Lower extremity weakness in patient with 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 He reports weakness in both legs and numbness in toes post surgery in 2018. He experienced a fall on 01/25/2023 at home, resulting in a L hip injury. No residual pain in hip at this time. No additional falls since December. He reports progressive LE weakness impairing his functional ability at home. He is known to this clinic from prior episodes of care for similar concerns.  Prior history: 11/02/22 Patient reports to physical therapy today with a chief concern of muscle weakness and imbalance. Patient reports progressive worsening in balance and muscular strength. He states that he has loss of sensation in his  toes he reports they are asleep. Patient reports pain in the anterior thigh and bilateral calves. He currently uses an loftstand crutch in his RUE. Lately patient has reported difficulty with dressing LE, performing fine motor movements with left hand. He denies falls, saddle parasthesia, nausea, vomitting, night sweats.    10/17/2022 Patient reported he is not doing well lately. He has been seeing Dr. Clois. He reports his legs are so weak that he can hardly walk, so he has been using a crutch for the last 1-2 years. Reported numbness and loss of sensation in his toes. He also reported that recently his left hand has began to has some numbness to where it feels like it is asleep. Denies pain, loss of sensation, coldness, burning. Denies falls.  Imaging: NCS conducted on 11/12/2018: Abnormal study. 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 There is also preliminary evidence of a superimposed left lower lumbosacral polyradiculopathy, based on NCV and distal needle exam results. I cannot test lumbar paraspinals due to patient on Eliquis .    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 Yes. 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  Recent memory is intact.  Remote memory is intact.  Attention span and concentration are intact.  Expressive speech is intact.  Patient's fund of knowledge is  within normal limits for educational level.    Gross Musculoskeletal Assessment Tremor: None Bulk: Normal Tone: Normal  Posture: Forward head and rounded shoulders  AROM Deferred specific measurements. Functional motion intact;  LE MMT: MMT (out of 5) Right  Left   Hip flexion 4 4+  Hip extension    Hip abduction (seated) 3+ 3+  Hip adduction (seated 3+ 3+  Hip internal rotation    Hip external rotation    Knee flexion (seated) 4 4  Knee extension 4 4  Ankle dorsiflexion 4 4  Ankle plantarflexion    Ankle inversion    Ankle eversion    (* = pain; Blank rows = not tested)  Transfers: Assistive device utilized: Single Lofstrand RUE  Sit to stand: Modified independence Stand to sit: Modified independence Chair to chair: Modified independence Floor: Deferred  Stairs: Level of Assistance: CGA Stair Negotiation Technique: Alternating Pattern  with Bilateral Rails Number of Stairs: 4  Height of Stairs: 6  Comments: Slow and labored ascending/descending. No overt LOB;  Gait: Gait pattern: decreased step length- Right, decreased step length- Left, trunk flexed, poor foot clearance- Right, and poor foot clearance- Left Distance walked: 150' over the course of  evaluation Assistive device utilized: Single Lofstrand in RUE Level of assistance: Modified independence Comments: Slow and labored ambulation. Decreased self selected speed  Functional Outcome Measures  Results Comments  BERG 40/56 Increased fall risk  DGI    FGA    TUG 22.2 seconds Increased fall risk  5TSTS Unable Increased fall risk  6 Minute Walk Test    10 Meter Gait Speed Self-selected: 20.5s = 0.49 m/s; Fastest: 18.0s = 0.56 m/s Below community ambulation speed  (Blank rows = not tested)   TODAY'S TREATMENT:    SUBJECTIVE: Patient reports ongoing BLE weakness and R shin pain from previous injury 30+ years ago. Pt reports shin pain has worsened the past 2 days. He saw pain management who is going to try a nerve block and if that is successful will consider a nerve ablation. No falls or stumbles since last session. Pt has no new concerns or questions.    PAIN: Chronic low back pain. Sharp R shin pain.     Therapeutic Activity NuStep (seat 12, hands 13) L1-4 x 10 minutes, for BLE strengthening and warm-up during interval history (5 mins unbilled);  Side stepping with 3# AW x multiple lengths;  Forward 6 toe taps alternating leading LE x 15 each; Calf raises 2 x 20;  Mini squats 2 x 20; Sit to stand with 6 # ball chest press 2 x 10; Seated LAQ with 3# ankle weights x 15 BLE;  Standing hip strengthening with 3# ankle weights (AW): Hip flexion marches x 15 BLE; Hip abduction x 15 BLE; Hip HS curl x 15 BLE;  Hip extension x 15 BLE;   SLS hip 3 way cone taps 2 x 8 ea leg;   Not Performed this session: Fwd step over 6 hurdles in // bars x 3 laps  Forward 6 step-ups alternating leading LE x 15 each; SL step ups on 6 steps x 15 ea LE;  Standing on AE pad, Alternating Basketball chest press and bounce pass with PT, 2 x 3' SL step ups on 6 steps x 15 ea LE;   PATIENT EDUCATION:  Education details: Pt educated throughout session about proper posture and technique  with exercises. Improved exercise technique, movement at target joints, use of target muscles after min to mod verbal, visual,  tactile cues.  Person educated: Patient Education method: Explanation, Verbal cues, and Handouts Education comprehension: verbalized understanding and returned demonstration   HOME EXERCISE PROGRAM:  Access Code: [email or web address] URL: [email or web address] Date: 05/31/2023 Prepared by: Selinda Eck  Exercises - Seated Long Arc Quad  - 1 x daily - 3-4 x weekly - 3 sets - 10 reps - 2s hold - Seated Hip Abduction with Resistance  - 1 x daily - 3-4 x weekly - 3 sets - 10 reps - 2s hold - Heel Raises with Counter Support  - 1 x daily - 3-4 x weekly - 3 sets - 10 reps - 2s hold - Mini Squat with Counter Support  - 1 x daily - 3-4 x weekly - 3 sets - 10 reps - Semi-Tandem Balance at [employer] Eyes Open  - 1 x daily - 7 x weekly - 2-3 sets - 10 reps - 10-30 hold   ASSESSMENT:  CLINICAL IMPRESSION:   Patient arrives to treatment session very motivated to participate. Continues to complain of sharp R shin pain. Session focused on BLE strengthening and dynamic balance activities. SLS cone taps were added to improve the pt's dynamic balance and functionality during SL tasks such as ambulation and stairs. Pt demonstrated appropriate effort throughout the session and had no increase in sx. He will benefit from PT services to address deficits in strength, balance, and mobility in order to return to full function at home and decrease his risk for falls.     OBJECTIVE IMPAIRMENTS: Abnormal gait, decreased balance, difficulty walking, decreased strength, impaired sensation, and pain.   ACTIVITY LIMITATIONS: bending, standing, squatting, stairs, transfers, and caring for others  PARTICIPATION LIMITATIONS: meal prep, cleaning, laundry, shopping, and community activity  PERSONAL FACTORS: Age, Past/current experiences, Time since onset of injury/illness/exacerbation, and  3+ comorbidities: generalized polyneuropathy, COPD, DM, NSTEMI, and spinal stenosis are also affecting patient's functional outcome.   REHAB POTENTIAL: Fair    CLINICAL DECISION MAKING: Unstable/unpredictable  EVALUATION COMPLEXITY: High   GOALS: Goals reviewed with patient? No  SHORT TERM GOALS: Target date: 07/10/2023  Pt will be independent with HEP in order to improve strength and balance in order to decrease fall risk and improve function at home. Baseline:  Goal status: MET   LONG TERM GOALS: Target date: 11/15/2023  Pt will improve ABC by at least 13% in order to demonstrate clinically significant improvement in balance confidence.  Baseline: 20%; 07/10/23: 30%, 08/13/23: 36.25% Goal status: ACHIEVED  2.  Pt will improve BERG by at least 3 points in order to demonstrate clinically significant improvement in balance.   Baseline: 40/56; 07/10/23: 50/56; 08/13/23: 52/56; Goal status: ACHIEVED  3.  Pt will increase self-selected to at least 0.6 m/s in order to demonstrate clinically significant improvement in limited community ambulation.         Baseline: self-selected: 0.49 m/s; 07/10/23: 0.52 m/s; 08/13/23: 0.49 m/s; Goal status: NOT MET  4. Pt will be able to complete 5TSTS without UE support in order to demonstrate clinically significant improvement in LE strength. Baseline: Unable to perform sit to stand without heavy UE assist; 07/10/23: 11.4s; 08/13/23: 12.72 s Goal status: ACHIEVED  5. Pt will decrease TUG to below 14 seconds/decrease in order to demonstrate decreased fall risk.  Baseline: 22.2s; 07/10/23: 14.0s; 08/13/23: 24.53s no crutch, 16.20s with crutch Goal status: ONGOING  PLAN: PT FREQUENCY: 2x/week  PT DURATION: 12 weeks  PLANNED INTERVENTIONS: Therapeutic exercises, Therapeutic activity, Neuromuscular re-education, Balance training, Gait training, Patient/Family education, Self  Care, Joint mobilization, Joint manipulation, Vestibular training, Canalith  repositioning, Orthotic/Fit training, DME instructions, Dry Needling, Electrical stimulation, Spinal manipulation, Spinal mobilization, Cryotherapy, Moist heat, Taping, Traction, Ultrasound, Ionotophoresis 4mg /ml Dexamethasone , Manual therapy, and Re-evaluation.  PLAN FOR NEXT SESSION: progress strengthening exercises, walking/ambulation speed and endurance tasks, modify/progress HEP as needed;   Jason D Huprich PT, DPT, GCS  Vernell Moats, SPT  10:43 AM,08/31/23

## 2023-09-05 ENCOUNTER — Ambulatory Visit

## 2023-09-05 DIAGNOSIS — R262 Difficulty in walking, not elsewhere classified: Secondary | ICD-10-CM | POA: Diagnosis not present

## 2023-09-05 DIAGNOSIS — M5459 Other low back pain: Secondary | ICD-10-CM | POA: Diagnosis not present

## 2023-09-05 DIAGNOSIS — M6281 Muscle weakness (generalized): Secondary | ICD-10-CM | POA: Diagnosis not present

## 2023-09-05 DIAGNOSIS — R2681 Unsteadiness on feet: Secondary | ICD-10-CM | POA: Diagnosis not present

## 2023-09-06 NOTE — Therapy (Signed)
 OUTPATIENT PHYSICAL THERAPY BALANCE TREATMENT NOTE   Patient Name: Sean Reyes. MRN: [id number] DOB:1940-10-05, 83 y.o., male Today's Date: 09/06/2023  END OF SESSION:  PT End of Session - 09/05/23 1056     Visit Number 25    Number of Visits 49    Date for PT Re-Evaluation 11/15/23    Authorization Type eval: 05/29/23;    Authorization Time Period Medicare A&B 2025  VL: Based on [place]  No auth req    PT Start Time 1015    PT Stop Time 1100    PT Time Calculation (min) 45 min    Equipment Utilized During Treatment Gait belt    Activity Tolerance Patient tolerated treatment well;Patient limited by fatigue    Behavior During Therapy WFL for tasks assessed/performed         Past Medical History:  Diagnosis Date   Anemia    Anxiety    Arthritis    Arthritis of neck    Atrial fibrillation (HCC)    Cataracts, bilateral    Complication of anesthesia    pt reports low BP's after surgery at [facility] and difficulty awakening   Depression    Diabetes (HCC)    dx 6-8 yrs ago   Dysrhythmia    a-fib   GERD (gastroesophageal reflux disease)    OCC TAKES ALKA SELTZER   History of kidney stones    10-15 yrs ago   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   Hyperlipidemia    Hypertension    Myocardial infarction (HCC) 12/2020   Nocturia    S/P ablation of atrial fibrillation    Ablative therapy   Sleep apnea    CPAP    Spinal stenosis    Tachycardia, unspecified    Past Surgical History:  Procedure Laterality Date   ABLATION     ANTERIOR LAT LUMBAR FUSION N/A 06/27/2017   Procedure: Anterior Lateral Lumbar Interbody  Fusion - Lumbar Two-Lumbar Three - Lumbar Three-Lumbar Four, Posterior Lumbar Interbody Fusion Lumbar Four- Five;  Surgeon: Onetha Kuba, MD;  Location: [facility] OR;  Service: Neurosurgery;  Laterality: N/A;  Anterior Lateral Lumbar Interbody  Fusion - Lumbar Two-Lumbar Three - Lumbar Three-Lumbar Four, Posterior Lumbar Interbody Fusion Lumbar Four- Five    BACK SURGERY     CARDIAC CATHETERIZATION     CARDIOVERSION N/A 08/29/2018   Procedure: CARDIOVERSION;  Surgeon: Hester Wolm PARAS, MD;  Location: [facility] ORS;  Service: Cardiovascular;  Laterality: N/A;   CARDIOVERSION N/A 09/24/2018   Procedure: CARDIOVERSION;  Surgeon: Hester Wolm PARAS, MD;  Location: [facility] ORS;  Service: Cardiovascular;  Laterality: N/A;   COLONOSCOPY WITH PROPOFOL  N/A 10/05/2015   Procedure: COLONOSCOPY WITH PROPOFOL ;  Surgeon: Gladis RAYMOND Mariner, MD;  Location: [facility] ENDOSCOPY;  Service: Endoscopy;  Laterality: N/A;   COLONOSCOPY WITH PROPOFOL  N/A 11/01/2020   Procedure: COLONOSCOPY WITH PROPOFOL ;  Surgeon: Therisa Bi, MD;  Location: [facility] ENDOSCOPY;  Service: Gastroenterology;  Laterality: N/A;   CORONARY STENT INTERVENTION N/A 12/22/2020   Procedure: CORONARY STENT INTERVENTION;  Surgeon: Ammon Blunt, MD;  Location: [facility] INVASIVE CV LAB;  Service: Cardiovascular;  Laterality: N/A;   ESOPHAGOGASTRODUODENOSCOPY N/A 11/01/2020   Procedure: ESOPHAGOGASTRODUODENOSCOPY (EGD);  Surgeon: Therisa Bi, MD;  Location: [facility] ENDOSCOPY;  Service: Gastroenterology;  Laterality: N/A;   ESOPHAGOGASTRODUODENOSCOPY (EGD) WITH PROPOFOL  N/A 04/01/2018   Procedure: ESOPHAGOGASTRODUODENOSCOPY (EGD) WITH PROPOFOL ;  Surgeon: Mariner Gladis RAYMOND, MD;  Location: [facility] ENDOSCOPY;  Service: Endoscopy;  Laterality: N/A;   ESOPHAGOGASTRODUODENOSCOPY (EGD) WITH  PROPOFOL  N/A 06/01/2022   Procedure: ESOPHAGOGASTRODUODENOSCOPY (EGD) WITH PROPOFOL ;  Surgeon: Therisa Bi, MD;  Location: [facility] ENDOSCOPY;  Service: Gastroenterology;  Laterality: N/A;   EYE SURGERY     HERNIA REPAIR     JOINT REPLACEMENT Bilateral    hips  RT+  LEFT X2    LEFT HEART CATH AND CORONARY ANGIOGRAPHY N/A 12/22/2020   Procedure: LEFT HEART CATH AND CORONARY ANGIOGRAPHY;  Surgeon: Ammon Blunt, MD;  Location: [facility] INVASIVE CV LAB;  Service: Cardiovascular;  Laterality: N/A;   LEFT HEART CATH AND CORONARY ANGIOGRAPHY Left  08/23/2021   Procedure: LEFT HEART CATH AND CORONARY ANGIOGRAPHY;  Surgeon: Hester Wolm PARAS, MD;  Location: [facility] INVASIVE CV LAB;  Service: Cardiovascular;  Laterality: Left;   LUMBAR LAMINECTOMY/DECOMPRESSION MICRODISCECTOMY Left 09/13/2016   Procedure: Microdiscectomy - Lumbar two-three,  Lumbar three- - left;  Surgeon: Onetha Kuba, MD;  Location: [facility] OR;  Service: Neurosurgery;  Laterality: Left;   SPINAL CORD STIMULATOR INSERTION  07/08/2019   TONSILLECTOMY     Patient Active Problem List   Diagnosis Date Noted   Lip swelling 07/04/2023   Diabetes mellitus treated with oral medication (HCC) 04/05/2023   Subareolar mass of left breast 10/03/2022   COPD (chronic obstructive pulmonary disease) (HCC) 10/31/2021   Shortness of breath on exertion 08/04/2021   Diabetes mellitus with proteinuria (HCC) 04/04/2021   GERD without esophagitis 04/02/2021   Coronary artery disease 12/28/2020   History of non-ST elevation myocardial infarction (NSTEMI) 12/21/2020   Pain in right shin 10/25/2020   Peripheral vascular disease (HCC) 08/06/2020   Chronic pain syndrome 07/06/2020   Cervical facet joint syndrome 04/08/2020   Spinal cord stimulator status 12/11/2019   Elevated TSH 08/20/2019   CKD (chronic kidney disease) stage 3, GFR 30-59 ml/min (HCC) 01/19/2019   Acquired thrombophilia (HCC) 01/19/2019   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   Postlaminectomy syndrome, lumbar region 01/16/2019   History of fusion of lumbar spine (L2-L5) 01/16/2019   Chronic radicular lumbar pain 01/16/2019   HNP (herniated nucleus pulposus), lumbar 04/29/2018   Advanced care planning/counseling discussion 09/28/2016   Spinal stenosis, lumbar region, 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/13/2016   Hyperlipidemia associated with type 2 diabetes mellitus (HCC) 07/14/2015   Symptomatic anemia 06/30/2015   Benign prostatic hyperplasia without lower urinary tract symptoms 06/02/2015   OSA (obstructive sleep apnea)  03/23/2015   Hypertension associated with diabetes (HCC) 09/28/2014   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HCC) 09/28/2014   H/O prior ablation treatment 10/19/2011   AF (paroxysmal atrial fibrillation) (HCC) 10/19/2011   PCP: Valerio Melanie DASEN, NP  REFERRING PROVIDER: Maree Jannett POUR, MD   REFERRING DIAG: Peripheral Neuropathy  RATIONALE FOR EVALUATION AND TREATMENT: Rehabilitation  THERAPY DIAG: Muscle weakness (generalized)  Difficulty in walking, not elsewhere classified  ONSET DATE: 01/25/23 (acute on chronic)  FOLLOW-UP APPT SCHEDULED WITH REFERRING PROVIDER: Yes   FROM INITIAL EVALUATION SUBJECTIVE:  SUBJECTIVE STATEMENT:  Imbalance and LE weakness;  PERTINENT HISTORY:  Lower extremity weakness in patient with 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 He reports weakness in both legs and numbness in toes post surgery in 2018. He experienced a fall on 01/25/2023 at home, resulting in a L hip injury. No residual pain in hip at this time. No additional falls since December. He reports progressive LE weakness impairing his functional ability at home. He is known to this clinic from prior episodes of care for similar concerns.  Prior history: 11/02/22 Patient reports to physical therapy today with a chief concern of muscle weakness and imbalance. Patient reports progressive worsening in balance and muscular strength. He states that he has loss of sensation in his toes he reports they  are asleep. Patient reports pain in the anterior thigh and bilateral calves. He currently uses an loftstand crutch in his RUE. Lately patient has reported difficulty with dressing LE, performing fine motor movements with left hand. He denies falls, saddle parasthesia, nausea, vomitting, night sweats.    10/17/2022 Patient reported he is not doing well lately. He has been seeing Dr. Clois. He reports his legs are so weak that he can hardly walk, so he has been using a crutch for the last 1-2 years. Reported numbness and loss of sensation in his toes. He also reported that recently his left hand has began to has some numbness to where it feels like it is asleep. Denies pain, loss of sensation, coldness, burning. Denies falls.  Imaging: NCS conducted on 11/12/2018: Abnormal study. 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 There is also preliminary evidence of a superimposed left lower lumbosacral polyradiculopathy, based on NCV and distal needle exam results. I cannot test lumbar paraspinals due to patient on Eliquis .    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 Yes. 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  Recent memory is intact.  Remote memory is intact.  Attention span and concentration are intact.  Expressive speech is intact.  Patient's fund of knowledge is  within normal limits for educational level.    Gross Musculoskeletal Assessment Tremor: None Bulk: Normal Tone: Normal  Posture: Forward head and rounded shoulders  AROM Deferred specific measurements. Functional motion intact;  LE MMT: MMT (out of 5) Right  Left   Hip flexion 4 4+  Hip extension    Hip abduction (seated) 3+ 3+  Hip adduction (seated 3+ 3+  Hip internal rotation    Hip external rotation    Knee flexion (seated) 4 4  Knee extension 4 4  Ankle dorsiflexion 4 4  Ankle plantarflexion    Ankle inversion    Ankle eversion    (* = pain; Blank rows = not tested)  Transfers: Assistive device utilized: Single Lofstrand RUE  Sit to stand: Modified independence Stand to sit: Modified independence Chair to chair: Modified independence Floor: Deferred  Stairs: Level of Assistance: CGA Stair Negotiation Technique: Alternating Pattern  with Bilateral Rails Number of Stairs: 4  Height of Stairs: 6  Comments: Slow and labored ascending/descending. No overt LOB;  Gait: Gait pattern: decreased step length- Right, decreased step length- Left, trunk flexed, poor foot clearance- Right, and poor foot clearance- Left Distance walked: 150' over the course of evaluation Assistive device  utilized: Single Lofstrand in RUE Level of assistance: Modified independence Comments: Slow and labored ambulation. Decreased self selected speed  Functional Outcome Measures  Results Comments  BERG 40/56 Increased fall risk  DGI    FGA    TUG 22.2 seconds Increased fall risk  5TSTS Unable Increased fall risk  6 Minute Walk Test    10 Meter Gait Speed Self-selected: 20.5s = 0.49 m/s; Fastest: 18.0s = 0.56 m/s Below community ambulation speed  (Blank rows = not tested)   TODAY'S TREATMENT:    SUBJECTIVE: Patient reports ongoing BLE weakness and R shin pain from previous injury 30+ years ago. No significant shin pain overnight last night. No falls or stumbles reported since last session. Pt has no new concerns or questions.    PAIN: Chronic low back pain. Sharp R shin pain.     Therapeutic Activity SciFit L4 x 8 minutes, for BLE strengthening and warm-up during interval history (5 mins unbilled);  Nautilus resisted gait 60# forward and backward x 3 each, R lateral and L lateral x 2 each, repeated rest breaks due to fatigue, pt performs slowly due to difficulty level; Forward 6 toe taps alternating leading LE x 10 each; Forward 6 step-ups alternating leading LE x 10 each;  Seated LAQ with 3# ankle weights x 15 BLE;  Standing hip strengthening with 3# ankle weights (AW): Hip flexion marches x 10 BLE; Hip abduction x 10 BLE; Hip HS curl x 10 BLE;  Hip extension x 10 BLE;    Not Performed this session: Fwd step over 6 hurdles in // bars x 3 laps  SL step ups on 6 steps x 15 ea LE;  Standing on AE pad, Alternating Basketball chest press and bounce pass with PT, 2 x 3' SL step ups on 6 steps x 15 ea LE;  Calf raises 2 x 20;  Mini squats 2 x 20; Sit to stand with 6 # ball chest press 2 x 10; SLS hip 3 way cone taps 2 x 8 ea leg;   PATIENT EDUCATION:  Education details: Pt educated throughout session about proper posture and technique with exercises. Improved exercise  technique, movement at target joints, use of target muscles after min to mod verbal, visual, tactile cues.  Person educated:  Patient Education method: Explanation, Verbal cues, and Handouts Education comprehension: verbalized understanding and returned demonstration   HOME EXERCISE PROGRAM:  Access Code: [email or web address] URL: [email or web address] Date: 05/31/2023 Prepared by: Selinda Eck  Exercises - Seated Long Arc Quad  - 1 x daily - 3-4 x weekly - 3 sets - 10 reps - 2s hold - Seated Hip Abduction with Resistance  - 1 x daily - 3-4 x weekly - 3 sets - 10 reps - 2s hold - Heel Raises with Counter Support  - 1 x daily - 3-4 x weekly - 3 sets - 10 reps - 2s hold - Mini Squat with Counter Support  - 1 x daily - 3-4 x weekly - 3 sets - 10 reps - Semi-Tandem Balance at [employer] Eyes Open  - 1 x daily - 7 x weekly - 2-3 sets - 10 reps - 10-30 hold   ASSESSMENT:  CLINICAL IMPRESSION:   Patient arrives to treatment session very motivated to participate. Session focused on BLE strengthening and dynamic balance activities. He continues to demonstrate difficulty with single leg stance and strength with step taps and step-ups. Repeated resisted gait with Nautilus which is difficult for patient and he requires multiple rest breaks throughout. Pt demonstrated appropriate effort throughout the session and had no increase in sx. He will benefit from PT services to address deficits in strength, balance, and mobility in order to improve function at home and decrease his risk for falls.     OBJECTIVE IMPAIRMENTS: Abnormal gait, decreased balance, difficulty walking, decreased strength, impaired sensation, and pain.   ACTIVITY LIMITATIONS: bending, standing, squatting, stairs, transfers, and caring for others  PARTICIPATION LIMITATIONS: meal prep, cleaning, laundry, shopping, and community activity  PERSONAL FACTORS: Age, Past/current experiences, Time since onset of  injury/illness/exacerbation, and 3+ comorbidities: generalized polyneuropathy, COPD, DM, NSTEMI, and spinal stenosis are also affecting patient's functional outcome.   REHAB POTENTIAL: Fair    CLINICAL DECISION MAKING: Unstable/unpredictable  EVALUATION COMPLEXITY: High   GOALS: Goals reviewed with patient? No  SHORT TERM GOALS: Target date: 07/10/2023  Pt will be independent with HEP in order to improve strength and balance in order to decrease fall risk and improve function at home. Baseline:  Goal status: MET   LONG TERM GOALS: Target date: 11/15/2023  Pt will improve ABC by at least 13% in order to demonstrate clinically significant improvement in balance confidence.  Baseline: 20%; 07/10/23: 30%, 08/13/23: 36.25% Goal status: ACHIEVED  2.  Pt will improve BERG by at least 3 points in order to demonstrate clinically significant improvement in balance.   Baseline: 40/56; 07/10/23: 50/56; 08/13/23: 52/56; Goal status: ACHIEVED  3.  Pt will increase self-selected to at least 0.6 m/s in order to demonstrate clinically significant improvement in limited community ambulation.         Baseline: self-selected: 0.49 m/s; 07/10/23: 0.52 m/s; 08/13/23: 0.49 m/s; Goal status: NOT MET  4. Pt will be able to complete 5TSTS without UE support in order to demonstrate clinically significant improvement in LE strength. Baseline: Unable to perform sit to stand without heavy UE assist; 07/10/23: 11.4s; 08/13/23: 12.72 s Goal status: ACHIEVED  5. Pt will decrease TUG to below 14 seconds/decrease in order to demonstrate decreased fall risk.  Baseline: 22.2s; 07/10/23: 14.0s; 08/13/23: 24.53s no crutch, 16.20s with crutch Goal status: ONGOING  PLAN: PT FREQUENCY: 2x/week  PT DURATION: 12 weeks  PLANNED INTERVENTIONS: Therapeutic exercises, Therapeutic activity, Neuromuscular re-education, Balance training, Gait training, Patient/Family education, Self Care, Joint mobilization,  Joint  manipulation, Vestibular training, Canalith repositioning, Orthotic/Fit training, DME instructions, Dry Needling, Electrical stimulation, Spinal manipulation, Spinal mobilization, Cryotherapy, Moist heat, Taping, Traction, Ultrasound, Ionotophoresis 4mg /ml Dexamethasone , Manual therapy, and Re-evaluation.  PLAN FOR NEXT SESSION: progress strengthening exercises, walking/ambulation speed and endurance tasks, modify/progress HEP as needed;   Santana Edell D Kahliyah Dick PT, DPT, GCS  8:30 AM,09/06/23

## 2023-09-10 NOTE — Therapy (Signed)
 OUTPATIENT PHYSICAL THERAPY BALANCE TREATMENT NOTE   Patient Name: Sean Reyes. MRN: [id number] DOB:03/22/1940, 83 y.o., male Today's Date: 09/13/2023  END OF SESSION:  PT End of Session - 09/13/23 1120     Visit Number 26    Number of Visits 49    Date for PT Re-Evaluation 11/15/23    Authorization Type eval: 05/29/23;    Authorization Time Period Medicare A&B 2025  VL: Based on [place]  No auth req    PT Start Time 1145    PT Stop Time 1230    PT Time Calculation (min) 45 min    Equipment Utilized During Treatment Gait belt    Activity Tolerance Patient tolerated treatment well;Patient limited by fatigue    Behavior During Therapy WFL for tasks assessed/performed          Past Medical History:  Diagnosis Date   Anemia    Anxiety    Arthritis    Arthritis of neck    Atrial fibrillation (HCC)    Cataracts, bilateral    Complication of anesthesia    pt reports low BP's after surgery at [facility] and difficulty awakening   Depression    Diabetes (HCC)    dx 6-8 yrs ago   Dysrhythmia    a-fib   GERD (gastroesophageal reflux disease)    OCC TAKES ALKA SELTZER   History of kidney stones    10-15 yrs ago   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   Hyperlipidemia    Hypertension    Myocardial infarction (HCC) 12/2020   Nocturia    S/P ablation of atrial fibrillation    Ablative therapy   Sleep apnea    CPAP    Spinal stenosis    Tachycardia, unspecified    Past Surgical History:  Procedure Laterality Date   ABLATION     ANTERIOR LAT LUMBAR FUSION N/A 06/27/2017   Procedure: Anterior Lateral Lumbar Interbody  Fusion - Lumbar Two-Lumbar Three - Lumbar Three-Lumbar Four, Posterior Lumbar Interbody Fusion Lumbar Four- Five;  Surgeon: Onetha Kuba, MD;  Location: [facility] OR;  Service: Neurosurgery;  Laterality: N/A;  Anterior Lateral Lumbar Interbody  Fusion - Lumbar Two-Lumbar Three - Lumbar Three-Lumbar Four, Posterior Lumbar Interbody Fusion Lumbar Four- Five    BACK SURGERY     CARDIAC CATHETERIZATION     CARDIOVERSION N/A 08/29/2018   Procedure: CARDIOVERSION;  Surgeon: Hester Wolm PARAS, MD;  Location: [facility] ORS;  Service: Cardiovascular;  Laterality: N/A;   CARDIOVERSION N/A 09/24/2018   Procedure: CARDIOVERSION;  Surgeon: Hester Wolm PARAS, MD;  Location: [facility] ORS;  Service: Cardiovascular;  Laterality: N/A;   COLONOSCOPY WITH PROPOFOL  N/A 10/05/2015   Procedure: COLONOSCOPY WITH PROPOFOL ;  Surgeon: Gladis RAYMOND Mariner, MD;  Location: [facility] ENDOSCOPY;  Service: Endoscopy;  Laterality: N/A;   COLONOSCOPY WITH PROPOFOL  N/A 11/01/2020   Procedure: COLONOSCOPY WITH PROPOFOL ;  Surgeon: Therisa Bi, MD;  Location: [facility] ENDOSCOPY;  Service: Gastroenterology;  Laterality: N/A;   CORONARY STENT INTERVENTION N/A 12/22/2020   Procedure: CORONARY STENT INTERVENTION;  Surgeon: Ammon Blunt, MD;  Location: [facility] INVASIVE CV LAB;  Service: Cardiovascular;  Laterality: N/A;   ESOPHAGOGASTRODUODENOSCOPY N/A 11/01/2020   Procedure: ESOPHAGOGASTRODUODENOSCOPY (EGD);  Surgeon: Therisa Bi, MD;  Location[facility] ENDOSCOPY;  Service: Gastroenterology;  Laterality: N/A;   ESOPHAGOGASTRODUODENOSCOPY (EGD) WITH PROPOFOL  N/A 04/01/2018   Procedure: ESOPHAGOGASTRODUODENOSCOPY (EGD) WITH PROPOFOL ;  Surgeon: Mariner Gladis RAYMOND, MD;  Location: [facility] ENDOSCOPY;  Service: Endoscopy;  Laterality: N/A;   ESOPHAGOGASTRODUODENOSCOPY (EGD)  WITH PROPOFOL  N/A 06/01/2022   Procedure: ESOPHAGOGASTRODUODENOSCOPY (EGD) WITH PROPOFOL ;  Surgeon: Therisa Bi, MD;  Location: [facility] ENDOSCOPY;  Service: Gastroenterology;  Laterality: N/A;   EYE SURGERY     HERNIA REPAIR     JOINT REPLACEMENT Bilateral    hips  RT+  LEFT X2    LEFT HEART CATH AND CORONARY ANGIOGRAPHY N/A 12/22/2020   Procedure: LEFT HEART CATH AND CORONARY ANGIOGRAPHY;  Surgeon: Ammon Blunt, MD;  Location: [facility] INVASIVE CV LAB;  Service: Cardiovascular;  Laterality: N/A;   LEFT HEART CATH AND CORONARY ANGIOGRAPHY Left  08/23/2021   Procedure: LEFT HEART CATH AND CORONARY ANGIOGRAPHY;  Surgeon: Hester Wolm PARAS, MD;  Location: [facility] INVASIVE CV LAB;  Service: Cardiovascular;  Laterality: Left;   LUMBAR LAMINECTOMY/DECOMPRESSION MICRODISCECTOMY Left 09/13/2016   Procedure: Microdiscectomy - Lumbar two-three,  Lumbar three- - left;  Surgeon: Onetha Kuba, MD;  Location: [facility] OR;  Service: Neurosurgery;  Laterality: Left;   SPINAL CORD STIMULATOR INSERTION  07/08/2019   TONSILLECTOMY     Patient Active Problem List   Diagnosis Date Noted   Lip swelling 07/04/2023   Diabetes mellitus treated with oral medication (HCC) 04/05/2023   Subareolar mass of left breast 10/03/2022   COPD (chronic obstructive pulmonary disease) (HCC) 10/31/2021   Shortness of breath on exertion 08/04/2021   Diabetes mellitus with proteinuria (HCC) 04/04/2021   GERD without esophagitis 04/02/2021   Coronary artery disease 12/28/2020   History of non-ST elevation myocardial infarction (NSTEMI) 12/21/2020   Pain in right shin 10/25/2020   Peripheral vascular disease (HCC) 08/06/2020   Chronic pain syndrome 07/06/2020   Cervical facet joint syndrome 04/08/2020   Spinal cord stimulator status 12/11/2019   Elevated TSH 08/20/2019   CKD (chronic kidney disease) stage 3, GFR 30-59 ml/min (HCC) 01/19/2019   Acquired thrombophilia (HCC) 01/19/2019   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   Postlaminectomy syndrome, lumbar region 01/16/2019   History of fusion of lumbar spine (L2-L5) 01/16/2019   Chronic radicular lumbar pain 01/16/2019   HNP (herniated nucleus pulposus), lumbar 04/29/2018   Advanced care planning/counseling discussion 09/28/2016   Spinal stenosis, lumbar region, 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/13/2016   Hyperlipidemia associated with type 2 diabetes mellitus (HCC) 07/14/2015   Symptomatic anemia 06/30/2015   Benign prostatic hyperplasia without lower urinary tract symptoms 06/02/2015   OSA (obstructive sleep apnea)  03/23/2015   Hypertension associated with diabetes (HCC) 09/28/2014   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HCC) 09/28/2014   H/O prior ablation treatment 10/19/2011   AF (paroxysmal atrial fibrillation) (HCC) 10/19/2011   PCP: Valerio Melanie DASEN, NP  REFERRING PROVIDER: Maree Jannett POUR, MD   REFERRING DIAG: Peripheral Neuropathy  RATIONALE FOR EVALUATION AND TREATMENT: Rehabilitation  THERAPY DIAG: Muscle weakness (generalized)  Difficulty in walking, not elsewhere classified  ONSET DATE: 01/25/23 (acute on chronic)  FOLLOW-UP APPT SCHEDULED WITH REFERRING PROVIDER: Yes   FROM INITIAL EVALUATION SUBJECTIVE:  SUBJECTIVE STATEMENT:  Imbalance and LE weakness;  PERTINENT HISTORY:  Lower extremity weakness in patient with 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 He reports weakness in both legs and numbness in toes post surgery in 2018. He experienced a fall on 01/25/2023 at home, resulting in a L hip injury. No residual pain in hip at this time. No additional falls since December. He reports progressive LE weakness impairing his functional ability at home. He is known to this clinic from prior episodes of care for similar concerns.  Prior history: 11/02/22 Patient reports to physical therapy today with a chief concern of muscle weakness and imbalance. Patient reports progressive worsening in balance and muscular strength. He states that he has loss of sensation in his toes he reports they  are asleep. Patient reports pain in the anterior thigh and bilateral calves. He currently uses an loftstand crutch in his RUE. Lately patient has reported difficulty with dressing LE, performing fine motor movements with left hand. He denies falls, saddle parasthesia, nausea, vomitting, night sweats.    10/17/2022 Patient reported he is not doing well lately. He has been seeing Dr. Clois. He reports his legs are so weak that he can hardly walk, so he has been using a crutch for the last 1-2 years. Reported numbness and loss of sensation in his toes. He also reported that recently his left hand has began to has some numbness to where it feels like it is asleep. Denies pain, loss of sensation, coldness, burning. Denies falls.  Imaging: NCS conducted on 11/12/2018: Abnormal study. 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 There is also preliminary evidence of a superimposed left lower lumbosacral polyradiculopathy, based on NCV and distal needle exam results. I cannot test lumbar paraspinals due to patient on Eliquis .    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 Yes. 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  Recent memory is intact.  Remote memory is intact.  Attention span and concentration are intact.  Expressive speech is intact.  Patient's fund of knowledge is  within normal limits for educational level.    Gross Musculoskeletal Assessment Tremor: None Bulk: Normal Tone: Normal  Posture: Forward head and rounded shoulders  AROM Deferred specific measurements. Functional motion intact;  LE MMT: MMT (out of 5) Right  Left   Hip flexion 4 4+  Hip extension    Hip abduction (seated) 3+ 3+  Hip adduction (seated 3+ 3+  Hip internal rotation    Hip external rotation    Knee flexion (seated) 4 4  Knee extension 4 4  Ankle dorsiflexion 4 4  Ankle plantarflexion    Ankle inversion    Ankle eversion    (* = pain; Blank rows = not tested)  Transfers: Assistive device utilized: Single Lofstrand RUE  Sit to stand: Modified independence Stand to sit: Modified independence Chair to chair: Modified independence Floor: Deferred  Stairs: Level of Assistance: CGA Stair Negotiation Technique: Alternating Pattern  with Bilateral Rails Number of Stairs: 4  Height of Stairs: 6  Comments: Slow and labored ascending/descending. No overt LOB;  Gait: Gait pattern: decreased step length- Right, decreased step length- Left, trunk flexed, poor foot clearance- Right, and poor foot clearance- Left Distance walked: 150' over the course of evaluation Assistive device  utilized: Single Lofstrand in RUE Level of assistance: Modified independence Comments: Slow and labored ambulation. Decreased self selected speed  Functional Outcome Measures  Results Comments  BERG 40/56 Increased fall risk  DGI    FGA    TUG 22.2 seconds Increased fall risk  5TSTS Unable Increased fall risk  6 Minute Walk Test    10 Meter Gait Speed Self-selected: 20.5s = 0.49 m/s; Fastest: 18.0s = 0.56 m/s Below community ambulation speed  (Blank rows = not tested)   TODAY'S TREATMENT:    SUBJECTIVE: Patient reports ongoing BLE weakness and R shin pain from previous injury 30+ years ago. No falls or stumbles reported since last session. His wife did have a fall and he had to help her off the ground with the assistance of a neighbor. Pt has no new concerns or questions.    PAIN: Chronic low back pain. Sharp R shin pain.     Therapeutic Activity SciFit L4 x 8 minutes, for BLE strengthening and warm-up during interval history (5 mins unbilled); Forward 6 step-ups alternating leading LE x 10 each; Sit to stand with 6 # ball chest press 2 x 10; Nautilus resisted gait 60# forward and backward x 3 each, R lateral and L lateral x 2 each, repeated rest breaks due to fatigue, pt performs slowly; Cross-over stepping in // bars without UE support x multiple lengths;  Standing hip strengthening with 3# ankle weights (AW): Hip flexion marches x 20 BLE; Hip abduction x 20 BLE; Hip HS curl x 20 BLE;  Hip extension x 20 BLE;   Seated LAQ with 3# ankle weights x 15 BLE; Standing heel raises x 20 BLE;   Not Performed this session: Fwd step over 6 hurdles in // bars x 3 laps  Standing on AE pad, Alternating Basketball chest press and bounce pass with PT, 2 x 3' Mini squats 2 x 20; SLS hip 3 way cone taps 2 x 8 ea leg;   PATIENT EDUCATION:  Education details: Pt educated throughout session about proper posture and technique with exercises. Improved exercise technique, movement at  target joints, use of target muscles after min to mod verbal, visual, tactile cues.  Person educated: Patient Education method: Explanation, Verbal cues, and Handouts Education comprehension: verbalized understanding  and returned demonstration   HOME EXERCISE PROGRAM:  Access Code: [email or web address] URL: [email or web address] Date: 05/31/2023 Prepared by: Selinda Eck  Exercises - Seated Long Arc Quad  - 1 x daily - 3-4 x weekly - 3 sets - 10 reps - 2s hold - Seated Hip Abduction with Resistance  - 1 x daily - 3-4 x weekly - 3 sets - 10 reps - 2s hold - Heel Raises with Counter Support  - 1 x daily - 3-4 x weekly - 3 sets - 10 reps - 2s hold - Mini Squat with Counter Support  - 1 x daily - 3-4 x weekly - 3 sets - 10 reps - Semi-Tandem Balance at [employer] Eyes Open  - 1 x daily - 7 x weekly - 2-3 sets - 10 reps - 10-30 hold   ASSESSMENT:  CLINICAL IMPRESSION:   Patient arrives to treatment session very motivated to participate. Session focused on BLE strengthening and dynamic balance activities. He continues to demonstrate difficulty with single leg stance and strength with step taps and step-ups. Repeated resisted gait with Nautilus which is difficult for patient and he requires multiple rest breaks throughout. Pt demonstrated appropriate effort throughout the session. He will benefit from PT services to address deficits in strength, balance, and mobility in order to improve function at home and decrease his risk for falls.     OBJECTIVE IMPAIRMENTS: Abnormal gait, decreased balance, difficulty walking, decreased strength, impaired sensation, and pain.   ACTIVITY LIMITATIONS: bending, standing, squatting, stairs, transfers, and caring for others  PARTICIPATION LIMITATIONS: meal prep, cleaning, laundry, shopping, and community activity  PERSONAL FACTORS: Age, Past/current experiences, Time since onset of injury/illness/exacerbation, and 3+ comorbidities: generalized  polyneuropathy, COPD, DM, NSTEMI, and spinal stenosis are also affecting patient's functional outcome.   REHAB POTENTIAL: Fair    CLINICAL DECISION MAKING: Unstable/unpredictable  EVALUATION COMPLEXITY: High   GOALS: Goals reviewed with patient? No  SHORT TERM GOALS: Target date: 07/10/2023  Pt will be independent with HEP in order to improve strength and balance in order to decrease fall risk and improve function at home. Baseline:  Goal status: MET   LONG TERM GOALS: Target date: 11/15/2023  Pt will improve ABC by at least 13% in order to demonstrate clinically significant improvement in balance confidence.  Baseline: 20%; 07/10/23: 30%, 08/13/23: 36.25% Goal status: ACHIEVED  2.  Pt will improve BERG by at least 3 points in order to demonstrate clinically significant improvement in balance.   Baseline: 40/56; 07/10/23: 50/56; 08/13/23: 52/56; Goal status: ACHIEVED  3.  Pt will increase self-selected to at least 0.6 m/s in order to demonstrate clinically significant improvement in limited community ambulation.         Baseline: self-selected: 0.49 m/s; 07/10/23: 0.52 m/s; 08/13/23: 0.49 m/s; Goal status: NOT MET  4. Pt will be able to complete 5TSTS without UE support in order to demonstrate clinically significant improvement in LE strength. Baseline: Unable to perform sit to stand without heavy UE assist; 07/10/23: 11.4s; 08/13/23: 12.72 s Goal status: ACHIEVED  5. Pt will decrease TUG to below 14 seconds/decrease in order to demonstrate decreased fall risk.  Baseline: 22.2s; 07/10/23: 14.0s; 08/13/23: 24.53s no crutch, 16.20s with crutch Goal status: ONGOING  PLAN: PT FREQUENCY: 2x/week  PT DURATION: 12 weeks  PLANNED INTERVENTIONS: Therapeutic exercises, Therapeutic activity, Neuromuscular re-education, Balance training, Gait training, Patient/Family education, Self Care, Joint mobilization, Joint manipulation, Vestibular training, Canalith repositioning, Orthotic/Fit  training, DME instructions, Dry Needling, Electrical stimulation, Spinal manipulation, Spinal  mobilization, Cryotherapy, Moist heat, Taping, Traction, Ultrasound, Ionotophoresis 4mg /ml Dexamethasone , Manual therapy, and Re-evaluation.  PLAN FOR NEXT SESSION: progress strengthening exercises, walking/ambulation speed and endurance tasks, modify/progress HEP as needed;   Nariya Neumeyer D Perley Arthurs PT, DPT, GCS  1:16 PM,09/13/23

## 2023-09-13 ENCOUNTER — Ambulatory Visit

## 2023-09-13 DIAGNOSIS — M6281 Muscle weakness (generalized): Secondary | ICD-10-CM | POA: Diagnosis not present

## 2023-09-13 DIAGNOSIS — M5459 Other low back pain: Secondary | ICD-10-CM | POA: Diagnosis not present

## 2023-09-13 DIAGNOSIS — R262 Difficulty in walking, not elsewhere classified: Secondary | ICD-10-CM

## 2023-09-13 DIAGNOSIS — R2681 Unsteadiness on feet: Secondary | ICD-10-CM | POA: Diagnosis not present

## 2023-09-15 NOTE — Therapy (Incomplete)
 OUTPATIENT PHYSICAL THERAPY BALANCE TREATMENT NOTE   Patient Name: Sean Reyes. MRN: [id number] DOB:[birth year], 83 y.o., male Today's Date: 09/15/2023  END OF SESSION:    Past Medical History:  Diagnosis Date   Anemia    Anxiety    Arthritis    Arthritis of neck    Atrial fibrillation (HCC)    Cataracts, bilateral    Complication of anesthesia    pt reports low BP's after surgery at [facility] and difficulty awakening   Depression    Diabetes (HCC)    dx 6-8 yrs ago   Dysrhythmia    a-fib   GERD (gastroesophageal reflux disease)    OCC TAKES ALKA SELTZER   History of kidney stones    10-15 yrs ago   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   Hyperlipidemia    Hypertension    Myocardial infarction (HCC) 12/2020   Nocturia    S/P ablation of atrial fibrillation    Ablative therapy   Sleep apnea    CPAP    Spinal stenosis    Tachycardia, unspecified    Past Surgical History:  Procedure Laterality Date   ABLATION     ANTERIOR LAT LUMBAR FUSION N/A 06/27/2017   Procedure: Anterior Lateral Lumbar Interbody  Fusion - Lumbar Two-Lumbar Three - Lumbar Three-Lumbar Four, Posterior Lumbar Interbody Fusion Lumbar Four- Five;  Surgeon: Onetha Kuba, MD;  Location: [facility] OR;  Service: Neurosurgery;  Laterality: N/A;  Anterior Lateral Lumbar Interbody  Fusion - Lumbar Two-Lumbar Three - Lumbar Three-Lumbar Four, Posterior Lumbar Interbody Fusion Lumbar Four- Five   BACK SURGERY     CARDIAC CATHETERIZATION     CARDIOVERSION N/A 08/29/2018   Procedure: CARDIOVERSION;  Surgeon: Hester Wolm PARAS, MD;  Location: [facility] ORS;  Service: Cardiovascular;  Laterality: N/A;   CARDIOVERSION N/A 09/24/2018   Procedure: CARDIOVERSION;  Surgeon: Hester Wolm PARAS, MD;  Location: [facility] ORS;  Service: Cardiovascular;  Laterality: N/A;   COLONOSCOPY WITH PROPOFOL  N/A 10/05/2015   Procedure: COLONOSCOPY WITH PROPOFOL ;  Surgeon: Gladis RAYMOND Mariner, MD;  Location: [facility] ENDOSCOPY;  Service:  Endoscopy;  Laterality: N/A;   COLONOSCOPY WITH PROPOFOL  N/A 11/01/2020   Procedure: COLONOSCOPY WITH PROPOFOL ;  Surgeon: Therisa Bi, MD;  Location: [facility] ENDOSCOPY;  Service: Gastroenterology;  Laterality: N/A;   CORONARY STENT INTERVENTION N/A 12/22/2020   Procedure: CORONARY STENT INTERVENTION;  Surgeon: Ammon Blunt, MD;  Location: [facility] INVASIVE CV LAB;  Service: Cardiovascular;  Laterality: N/A;   ESOPHAGOGASTRODUODENOSCOPY N/A 11/01/2020   Procedure: ESOPHAGOGASTRODUODENOSCOPY (EGD);  Surgeon: Therisa Bi, MD;  Location: [facility] ENDOSCOPY;  Service: Gastroenterology;  Laterality: N/A;   ESOPHAGOGASTRODUODENOSCOPY (EGD) WITH PROPOFOL  N/A 04/01/2018   Procedure: ESOPHAGOGASTRODUODENOSCOPY (EGD) WITH PROPOFOL ;  Surgeon: Mariner Gladis RAYMOND, MD;  Location: [facility] ENDOSCOPY;  Service: Endoscopy;  Laterality: N/A;   ESOPHAGOGASTRODUODENOSCOPY (EGD) WITH PROPOFOL  N/A 06/01/2022   Procedure: ESOPHAGOGASTRODUODENOSCOPY (EGD) WITH PROPOFOL ;  Surgeon: Therisa Bi, MD;  Location: [facility] ENDOSCOPY;  Service: Gastroenterology;  Laterality: N/A;   EYE SURGERY     HERNIA REPAIR     JOINT REPLACEMENT Bilateral    hips  RT+  LEFT X2    LEFT HEART CATH AND CORONARY ANGIOGRAPHY N/A 12/22/2020   Procedure: LEFT HEART CATH AND CORONARY ANGIOGRAPHY;  Surgeon: Ammon Blunt, MD;  Location: [facility] INVASIVE CV LAB;  Service: Cardiovascular;  Laterality: N/A;   LEFT HEART CATH AND CORONARY ANGIOGRAPHY Left 08/23/2021   Procedure: LEFT HEART CATH AND CORONARY ANGIOGRAPHY;  Surgeon: Hester Wolm PARAS, MD;  Location: [facility] INVASIVE CV LAB;  Service: Cardiovascular;  Laterality: Left;   LUMBAR LAMINECTOMY/DECOMPRESSION MICRODISCECTOMY Left 09/13/2016   Procedure: Microdiscectomy - Lumbar two-three,  Lumbar three- - left;  Surgeon: Onetha Kuba, MD;  Location: [facility] OR;  Service: Neurosurgery;  Laterality: Left;   SPINAL CORD STIMULATOR INSERTION  07/08/2019   TONSILLECTOMY     Patient Active Problem List   Diagnosis  Date Noted   Lip swelling 07/04/2023   Diabetes mellitus treated with oral medication (HCC) 04/05/2023   Subareolar mass of left breast 10/03/2022   COPD (chronic obstructive pulmonary disease) (HCC) 10/31/2021   Shortness of breath on exertion 08/04/2021   Diabetes mellitus with proteinuria (HCC) 04/04/2021   GERD without esophagitis 04/02/2021   Coronary artery disease 12/28/2020   History of non-ST elevation myocardial infarction (NSTEMI) 12/21/2020   Pain in right shin 10/25/2020   Peripheral vascular disease (HCC) 08/06/2020   Chronic pain syndrome 07/06/2020   Cervical facet joint syndrome 04/08/2020   Spinal cord stimulator status 12/11/2019   Elevated TSH 08/20/2019   CKD (chronic kidney disease) stage 3, GFR 30-59 ml/min (HCC) 01/19/2019   Acquired thrombophilia (HCC) 01/19/2019   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   Postlaminectomy syndrome, lumbar region 01/16/2019   History of fusion of lumbar spine (L2-L5) 01/16/2019   Chronic radicular lumbar pain 01/16/2019   HNP (herniated nucleus pulposus), lumbar 04/29/2018   Advanced care planning/counseling discussion 09/28/2016   Spinal stenosis, lumbar region, 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/13/2016   Hyperlipidemia associated with type 2 diabetes mellitus (HCC) 07/14/2015   Symptomatic anemia 06/30/2015   Benign prostatic hyperplasia without lower urinary tract symptoms 06/02/2015   OSA (obstructive sleep apnea) 03/23/2015   Hypertension associated with diabetes (HCC) 09/28/2014   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HCC) 09/28/2014   H/O prior ablation treatment 10/19/2011   AF (paroxysmal atrial fibrillation) (HCC) 10/19/2011   PCP: Valerio Melanie DASEN, NP  REFERRING PROVIDER: Maree Jannett POUR, MD   REFERRING DIAG: Peripheral Neuropathy  RATIONALE FOR EVALUATION AND TREATMENT: Rehabilitation  THERAPY DIAG: Muscle weakness (generalized)  Difficulty in walking, not elsewhere classified  ONSET DATE:  01/25/23 (acute on chronic)  FOLLOW-UP APPT SCHEDULED WITH REFERRING PROVIDER: Yes   FROM INITIAL EVALUATION SUBJECTIVE:                                                                                                                                                                                         SUBJECTIVE STATEMENT:  Imbalance and LE weakness;  PERTINENT HISTORY:  Lower extremity weakness in patient with history of 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 He reports weakness in both legs and numbness in toes post surgery in 2018. He experienced a fall on 01/25/2023 at home, resulting in a L hip injury. No residual pain in hip at this time. No additional falls since December. He reports progressive LE weakness impairing his functional ability at home. He is known to this clinic from prior episodes of care for similar concerns.  Prior history: 11/02/22 Patient reports to physical therapy today with a chief concern of muscle weakness and imbalance. Patient reports progressive worsening in balance and muscular strength. He states that he has loss of sensation in his toes he reports they are asleep. Patient reports pain in the anterior thigh and bilateral calves. He currently uses an loftstand crutch in his RUE. Lately patient has reported difficulty with dressing LE, performing fine motor movements with left hand. He denies falls, saddle parasthesia, nausea, vomitting, night sweats.    10/17/2022 Patient reported he is not doing well lately. He has been seeing Dr. Clois. He reports his legs are so weak that he can hardly walk, so he has been using a crutch for the last 1-2 years. Reported  numbness and loss of sensation in his toes. He also reported that recently his left hand has began to has some numbness to where it feels like it is asleep. Denies pain, loss of sensation, coldness, burning. Denies falls.  Imaging: NCS conducted on 11/12/2018: Abnormal study. 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 There is also preliminary evidence of a superimposed left lower lumbosacral polyradiculopathy, based on NCV and distal needle exam results. I cannot test lumbar paraspinals due to patient on Eliquis .    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 Yes. 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  Recent memory is intact.  Remote memory is intact.  Attention span and concentration are intact.  Expressive speech is intact.  Patient's fund of knowledge is within normal limits for educational level.    Gross Musculoskeletal Assessment Tremor: None Bulk: Normal Tone: Normal  Posture: Forward head and rounded shoulders  AROM Deferred specific measurements. Functional motion intact;  LE MMT: MMT (out of 5) Right  Left   Hip flexion 4 4+  Hip extension    Hip abduction (seated) 3+ 3+  Hip adduction (seated 3+ 3+  Hip internal rotation    Hip external rotation    Knee flexion (seated) 4 4  Knee extension 4 4  Ankle dorsiflexion 4 4  Ankle plantarflexion    Ankle inversion    Ankle eversion    (* = pain; Blank rows = not tested)  Transfers: Assistive device utilized: Single Lofstrand RUE  Sit to stand: Modified independence Stand to sit: Modified independence Chair to chair: Modified independence Floor: Deferred  Stairs: Level of Assistance: CGA Stair Negotiation Technique: Alternating Pattern  with Bilateral Rails Number of Stairs: 4  Height of Stairs: 6  Comments: Slow and labored ascending/descending. No overt LOB;  Gait: Gait pattern: decreased step length- Right, decreased step length- Left, trunk flexed, poor foot clearance- Right, and poor foot clearance- Left Distance walked: 150' over the course of evaluation Assistive device utilized: Single Lofstrand in RUE Level of assistance: Modified independence Comments: Slow and labored ambulation. Decreased self selected speed  Functional Outcome Measures  Results Comments  BERG 40/56 Increased fall risk  DGI    FGA    TUG 22.2 seconds Increased fall risk  5TSTS Unable Increased fall risk  6 Minute Walk Test    10 Meter Gait Speed Self-selected: 20.5s = 0.49 m/s; Fastest: 18.0s = 0.56 m/s Below community ambulation speed  (Blank rows = not tested)   TODAY'S TREATMENT:    SUBJECTIVE:  Patient reports ongoing BLE weakness and R shin pain from previous injury 30+ years ago. No falls or stumbles reported since last session. His wife did have a fall and he had to help her off the ground with the assistance of a neighbor. Pt has no new concerns or questions.    PAIN: Chronic low back pain. Sharp R shin pain.     Therapeutic Activity SciFit L4 x 8 minutes, for BLE strengthening and warm-up during interval history (5 mins unbilled); Forward 6 step-ups alternating leading LE x 10 each; Sit to stand with 6 # ball chest press 2 x 10; Nautilus resisted gait 60# forward and backward x 3 each, R lateral and L lateral x 2 each, repeated rest breaks due to fatigue, pt performs slowly; Cross-over stepping in // bars without UE support x multiple lengths;  Standing hip strengthening with 3# ankle weights (AW): Hip flexion marches x 20 BLE; Hip abduction x 20 BLE; Hip HS curl x 20 BLE;  Hip extension x 20 BLE;   Seated LAQ with 3# ankle weights x 15 BLE; Standing heel raises x 20 BLE;   Not Performed this session: Fwd step over 6 hurdles in // bars x 3 laps  Standing on AE pad, Alternating Basketball chest press and bounce pass with PT, 2 x 3' Mini squats 2 x 20; SLS hip 3 way cone taps 2 x 8 ea leg;   PATIENT EDUCATION:  Education details: Pt educated throughout session about proper posture and technique with exercises. Improved exercise technique, movement at target joints, use of target muscles after min to mod verbal, visual, tactile cues.  Person educated: Patient Education method: Explanation, Verbal cues, and Handouts Education comprehension: verbalized understanding and returned demonstration   HOME EXERCISE PROGRAM:  Access Code: [email or web address] URL: [email or web address] Date: 05/31/2023 Prepared by: Selinda Eck  Exercises - Seated Long Arc Quad  - 1 x daily - 3-4 x  weekly - 3 sets - 10 reps - 2s hold - Seated Hip Abduction with Resistance  - 1 x  daily - 3-4 x weekly - 3 sets - 10 reps - 2s hold - Heel Raises with Counter Support  - 1 x daily - 3-4 x weekly - 3 sets - 10 reps - 2s hold - Mini Squat with Counter Support  - 1 x daily - 3-4 x weekly - 3 sets - 10 reps - Semi-Tandem Balance at [employer] Eyes Open  - 1 x daily - 7 x weekly - 2-3 sets - 10 reps - 10-30 hold   ASSESSMENT:  CLINICAL IMPRESSION:   Patient arrives to treatment session very motivated to participate. Session focused on BLE strengthening and dynamic balance activities. He continues to demonstrate difficulty with single leg stance and strength with step taps and step-ups. Repeated resisted gait with Nautilus which is difficult for patient and he requires multiple rest breaks throughout. Pt demonstrated appropriate effort throughout the session. He will benefit from PT services to address deficits in strength, balance, and mobility in order to improve function at home and decrease his risk for falls.     OBJECTIVE IMPAIRMENTS: Abnormal gait, decreased balance, difficulty walking, decreased strength, impaired sensation, and pain.   ACTIVITY LIMITATIONS: bending, standing, squatting, stairs, transfers, and caring for others  PARTICIPATION LIMITATIONS: meal prep, cleaning, laundry, shopping, and community activity  PERSONAL FACTORS: Age, Past/current experiences, Time since onset of injury/illness/exacerbation, and 3+ comorbidities: generalized polyneuropathy, COPD, DM, NSTEMI, and spinal stenosis are also affecting patient's functional outcome.   REHAB POTENTIAL: Fair    CLINICAL DECISION MAKING: Unstable/unpredictable  EVALUATION COMPLEXITY: High   GOALS: Goals reviewed with patient? No  SHORT TERM GOALS: Target date: 07/10/2023  Pt will be independent with HEP in order to improve strength and balance in order to decrease fall risk and improve function at home. Baseline:  Goal status: MET   LONG TERM GOALS: Target date: 11/15/2023  Pt will improve ABC  by at least 13% in order to demonstrate clinically significant improvement in balance confidence.  Baseline: 20%; 07/10/23: 30%, 08/13/23: 36.25% Goal status: ACHIEVED  2.  Pt will improve BERG by at least 3 points in order to demonstrate clinically significant improvement in balance.   Baseline: 40/56; 07/10/23: 50/56; 08/13/23: 52/56; Goal status: ACHIEVED  3.  Pt will increase self-selected to at least 0.6 m/s in order to demonstrate clinically significant improvement in limited community ambulation.         Baseline: self-selected: 0.49 m/s; 07/10/23: 0.52 m/s; 08/13/23: 0.49 m/s; Goal status: NOT MET  4. Pt will be able to complete 5TSTS without UE support in order to demonstrate clinically significant improvement in LE strength. Baseline: Unable to perform sit to stand without heavy UE assist; 07/10/23: 11.4s; 08/13/23: 12.72 s Goal status: ACHIEVED  5. Pt will decrease TUG to below 14 seconds/decrease in order to demonstrate decreased fall risk.  Baseline: 22.2s; 07/10/23: 14.0s; 08/13/23: 24.53s no crutch, 16.20s with crutch Goal status: ONGOING  PLAN: PT FREQUENCY: 2x/week  PT DURATION: 12 weeks  PLANNED INTERVENTIONS: Therapeutic exercises, Therapeutic activity, Neuromuscular re-education, Balance training, Gait training, Patient/Family education, Self Care, Joint mobilization, Joint manipulation, Vestibular training, Canalith repositioning, Orthotic/Fit training, DME instructions, Dry Needling, Electrical stimulation, Spinal manipulation, Spinal mobilization, Cryotherapy, Moist heat, Taping, Traction, Ultrasound, Ionotophoresis 4mg /ml Dexamethasone , Manual therapy, and Re-evaluation.  PLAN FOR NEXT SESSION: progress strengthening exercises, walking/ambulation speed and endurance tasks, modify/progress HEP as needed;   Breindel Collier D  Odilia Damico PT, DPT, GCS  3:02 PM,09/15/23

## 2023-09-17 ENCOUNTER — Ambulatory Visit
Attending: Student in an Organized Health Care Education/Training Program | Admitting: Student in an Organized Health Care Education/Training Program

## 2023-09-17 ENCOUNTER — Encounter: Payer: Self-pay | Admitting: Student in an Organized Health Care Education/Training Program

## 2023-09-17 ENCOUNTER — Encounter: Payer: Self-pay | Admitting: Nurse Practitioner

## 2023-09-17 VITALS — BP 169/90 | HR 86 | Temp 99.0°F | Resp 18 | Ht 73.0 in | Wt 187.0 lb

## 2023-09-17 DIAGNOSIS — G5781 Other specified mononeuropathies of right lower limb: Secondary | ICD-10-CM | POA: Insufficient documentation

## 2023-09-17 DIAGNOSIS — M898X6 Other specified disorders of bone, lower leg: Secondary | ICD-10-CM | POA: Insufficient documentation

## 2023-09-17 DIAGNOSIS — M7918 Myalgia, other site: Secondary | ICD-10-CM | POA: Insufficient documentation

## 2023-09-17 MED ORDER — LIDOCAINE HCL 2 % IJ SOLN
20.0000 mL | Freq: Once | INTRAMUSCULAR | Status: AC
  Administered 2023-09-17: 400 mg

## 2023-09-17 MED ORDER — LIDOCAINE HCL 2 % IJ SOLN
INTRAMUSCULAR | Status: AC
  Filled 2023-09-17: qty 20

## 2023-09-17 MED ORDER — DEXAMETHASONE SODIUM PHOSPHATE 10 MG/ML IJ SOLN
20.0000 mg | Freq: Once | INTRAMUSCULAR | Status: AC
  Administered 2023-09-17: 10 mg

## 2023-09-17 MED ORDER — ROPIVACAINE HCL 2 MG/ML IJ SOLN
18.0000 mL | Freq: Once | INTRAMUSCULAR | Status: AC
  Administered 2023-09-17: 20 mL via PERINEURAL

## 2023-09-17 MED ORDER — ROPIVACAINE HCL 2 MG/ML IJ SOLN
INTRAMUSCULAR | Status: AC
  Filled 2023-09-17: qty 20

## 2023-09-17 MED ORDER — DEXAMETHASONE SODIUM PHOSPHATE 10 MG/ML IJ SOLN
INTRAMUSCULAR | Status: AC
Start: 2023-09-17 — End: 2023-09-17
  Filled 2023-09-17: qty 2

## 2023-09-17 NOTE — Patient Instructions (Signed)

## 2023-09-17 NOTE — Progress Notes (Signed)
 PROVIDER NOTE: Interpretation of information contained herein should be left to medically-trained personnel. Specific patient instructions are provided elsewhere under Patient Instructions section of medical record. This document was created in part using STT-dictation technology, any transcriptional errors that may result from this process are unintentional.  Patient: Sean Fidalgo Jr. Type: Established DOB: 1940-05-31 MRN: [id number] PCP: Valerio Melanie DASEN, NP  Service: Procedure DOS: 09/17/2023 Setting: Ambulatory Location: Ambulatory outpatient facility Delivery: Face-to-face Provider: Wallie Sherry, MD Specialty: Interventional Pain Management Specialty designation: 09 Location: Outpatient facility Ref. Prov.: [name], Jolene T, NP       Interventional Therapy   Type:  Right medial tibialTrigger Point Injection (Myoneural Block) (1-2 muscle groups)  #1 (w/ steroids)  CPT: 20552 Laterality: Right (-RT)   Imaging: N/A. Landmark-guided,           Anesthesia: Local anesthesia (1-2% Lidocaine ) DOS: 09/17/2023  Performed by: Wallie Sherry, MD  Medical Necessity (reasoning)  Purpose: Diagnostic/Therapeutic Rationale (medical necessity): procedure needed and proper for the diagnosis and/or treatment of [name] medical symptoms and needs. Indications: RIGHT pain severe enough to impact quality of life and/or function. 1. Saphenous nerve neuropathy, right   2. Pain of right tibia   3. Myofascial pain    NAS-11 Pain score:   Pre-procedure: 4 /10   Post-procedure: 0-No pain/10    Right medial tibia TPI  Position  Prep  Materials  Position: Supine. Patient assisted into a comfortable position. Pressure points checked.  Prep solution: ChloraPrep (2% chlorhexidine  gluconate and 70% isopropyl alcohol) The target area was identified and the area prepped in the usual manner.  Prep Area:  Materials:   Tray: Block Needle(s):  Type: Regular  Gauge (G): 22  Length: 1.5-in  Qty:  1  H&P (Pre-op Assessment):  Sean Reyes is a 83 y.o. (year old), male patient, seen today for interventional treatment. He  has a past surgical history that includes Tonsillectomy; Hernia repair; Ablation; Colonoscopy with propofol  (N/A, 10/05/2015); Lumbar laminectomy/decompression microdiscectomy (Left, 09/13/2016); Anterior lat lumbar fusion (N/A, 06/27/2017); Joint replacement (Bilateral); Back surgery; Esophagogastroduodenoscopy (egd) with propofol  (N/A, 04/01/2018); Cardioversion (N/A, 08/29/2018); Cardioversion (N/A, 09/24/2018); Spinal cord stimulator insertion (07/08/2019); Colonoscopy with propofol  (N/A, 11/01/2020); Esophagogastroduodenoscopy (N/A, 11/01/2020); LEFT HEART CATH AND CORONARY ANGIOGRAPHY (N/A, 12/22/2020); CORONARY STENT INTERVENTION (N/A, 12/22/2020); LEFT HEART CATH AND CORONARY ANGIOGRAPHY (Left, 08/23/2021); Cardiac catheterization; Eye surgery; and Esophagogastroduodenoscopy (egd) with propofol  (N/A, 06/01/2022). Mr. Guess has a current medication list which includes the following prescription(s): amlodipine , benazepril , cyanocobalamin , eliquis , epinephrine , ferrous sulfate, finasteride , metformin , omeprazole , prednisolone acetate, rosuvastatin , solifenacin , tamsulosin , and anoro ellipta . His primarily concern today is the Leg Pain (Right shin)  Initial Vital Signs:  Pulse/HCG Rate: 73  Temp: 99 F (37.2 C) Resp: 16 BP: 132/68 SpO2: 100 %  BMI: Estimated body mass index is 24.67 kg/m as calculated from the following:   Height as of this encounter: 6' 1 (1.854 m).   Weight as of this encounter: 187 lb (84.8 kg).  Risk Assessment: Allergies: Reviewed. He is allergic to levaquin [levofloxacin in d5w], shellfish allergy, amiodarone, and adhesive [tape].  Allergy Precautions: None required Coagulopathies: Reviewed. None identified.  Blood-thinner therapy: None at this time Active Infection(s): Reviewed. None identified. [name] is afebrile  Site Confirmation: Mr.  Whitby was asked to confirm the procedure and laterality before marking the site Procedure checklist: Completed Consent: Before the procedure and under the influence of no sedative(s), amnesic(s), or anxiolytics, the patient was informed of the treatment options, risks and possible complications. To fulfill  our ethical and legal obligations, as recommended by the American Medical Association's Code of Ethics, I have informed the patient of my clinical impression; the nature and purpose of the treatment or procedure; the risks, benefits, and possible complications of the intervention; the alternatives, including doing nothing; the risk(s) and benefit(s) of the alternative treatment(s) or procedure(s); and the risk(s) and benefit(s) of doing nothing. The patient was provided information about the general risks and possible complications associated with the procedure. These may include, but are not limited to: failure to achieve desired goals, infection, bleeding, organ or nerve damage, allergic reactions, paralysis, and death. In addition, the patient was informed of those risks and complications associated to the procedure, such as failure to decrease pain; infection; bleeding; organ or nerve damage with subsequent damage to sensory, motor, and/or autonomic systems, resulting in permanent pain, numbness, and/or weakness of one or several areas of the body; allergic reactions; (i.e.: anaphylactic reaction); and/or death. Furthermore, the patient was informed of those risks and complications associated with the medications. These include, but are not limited to: allergic reactions (i.e.: anaphylactic or anaphylactoid reaction(s)); adrenal axis suppression; blood sugar elevation that in diabetics may result in ketoacidosis or comma; water retention that in patients with history of congestive heart failure may result in shortness of breath, pulmonary edema, and decompensation with resultant heart failure; weight  gain; swelling or edema; medication-induced neural toxicity; particulate matter embolism and blood vessel occlusion with resultant organ, and/or nervous system infarction; and/or aseptic necrosis of one or more joints. Finally, the patient was informed that Medicine is not an exact science; therefore, there is also the possibility of unforeseen or unpredictable risks and/or possible complications that may result in a catastrophic outcome. The patient indicated having understood very clearly. We have given the patient no guarantees and we have made no promises. Enough time was given to the patient to ask questions, all of which were answered to the patient's satisfaction. Mr. Rossa has indicated that he wanted to continue with the procedure. Attestation: I, the ordering provider, attest that I have discussed with the patient the benefits, risks, side-effects, alternatives, likelihood of achieving goals, and potential problems during recovery for the procedure that I have provided informed consent. Date  Time: 09/17/2023 10:39 AM   Pre-Procedure Preparation:  Monitoring: As per clinic protocol. Respiration, ETCO2, SpO2, BP, heart rate and rhythm monitor placed and checked for adequate function Safety Precautions: Patient was assessed for positional comfort and pressure points before starting the procedure. Time-out: I initiated and conducted the Time-out before starting the procedure, as per protocol. The patient was asked to participate by confirming the accuracy of the Time Out information. Verification of the correct person, site, and procedure were performed and confirmed by me, the nursing staff, and the patient. Time-out conducted as per Joint Commission's Universal Protocol ([email or web address].01.01.01). Time: 1145 Start Time: 1145 hrs.   Narrative                Start Time: 1145 hrs.  Standard Safety Precautions: Protocol guidelines were followed. Aspiration was conducted prior to injection. At no point  did I inject any substances, as a needle was being advanced. No attempts were made at seeking a paresthesia. Safe injection practices and needle disposal techniques used. Medications properly checked for expiration dates. SDV (single dose vial) medications used.  Local Anesthesia: Skin & deeper tissues infiltrated with local anesthetic. Appropriate amount of time allowed for local anesthetics to take effect.   Technical description:  The target  area was identified and the area prepped in the usual manner. The procedure needles were then advanced to the target area. Proper needle placement secured. Negative aspiration confirmed. Solution injected in intermittent fashion, asking for systemic symptoms every 0.5cc of injectate. The needles were then removed and the area cleansed, making sure to leave some of the prepping solution back to take advantage of its long term bactericidal properties.  Right tibia medial trigger point injection with solution containing 6 cc of 0.2% ropivacaine , 2 cc of Decadron  10 mg/cc.  Vitals:   09/17/23 1058 09/17/23 1147  BP: 132/68 (!) 169/90  Pulse: 73 86  Resp: 16 18  Temp: 99 F (37.2 C)   SpO2: 100% 100%  Weight: 187 lb (84.8 kg)   Height: 6' 1 (1.854 m)      End Time: 1146 hrs.  Imaging Guidance                Type of Imaging Technique: None used Indication(s): N/A Exposure Time: No patient exposure Contrast: None used. Fluoroscopic Guidance: N/A Ultrasound Guidance: N/A Interpretation: N/A   Post-operative Assessment:  Post-procedure Vital Signs:  Pulse/HCG Rate: 86  Temp: 99 F (37.2 C) Resp: 18 BP: (!) 169/90 SpO2: 100 %  EBL: None  Complications: No immediate post-treatment complications observed by team, or reported by patient.  Note: The patient tolerated the entire procedure well. A repeat set of vitals were taken after the procedure and the patient was kept under observation following institutional policy, for this type of  procedure. Post-procedural neurological assessment was performed, showing return to baseline, prior to discharge. The patient was provided with post-procedure discharge instructions, including a section on how to identify potential problems. Should any problems arise concerning this procedure, the patient was given instructions to immediately contact us , at any time, without hesitation. In any case, we plan to contact the patient by telephone for a follow-up status report regarding this interventional procedure.  Comments:  No additional relevant information.   Plan of Care (POC)  Orders:  No orders of the defined types were placed in this encounter.    Medications ordered for procedure: Meds ordered this encounter  Medications   lidocaine  (XYLOCAINE ) 2 % (with pres) injection 400 mg   ropivacaine  (PF) 2 mg/mL (0.2%) (NAROPIN ) injection 18 mL   dexamethasone  (DECADRON ) injection 20 mg   Medications administered: We administered lidocaine , ropivacaine  (PF) 2 mg/mL (0.2%), and dexamethasone .  See the medical record for exact dosing, route, and time of administration.    Follow-up plan:   Return in about 4 weeks (around 10/15/2023) for PPE in person.     Recent Visits Date Type Provider Dept  08/28/23 Office Visit Marcelino Nurse, MD [facility]  Showing recent visits within past 90 days and meeting all other requirements Today's Visits Date Type Provider Dept  09/17/23 Procedure visit Marcelino Nurse, MD [facility]  Showing today's visits and meeting all other requirements Future Appointments Date Type Provider Dept  10/11/23 Appointment Marcelino Nurse, MD [facility]  Showing future appointments within next 90 days and meeting all other requirements   Disposition: Discharge home  Discharge (Date  Time): 09/17/2023; 1150 hrs.   Primary Care Physician: [name], Jolene T, NP Location: [facility] Note by: Nurse Marcelino, MD  (TTS technology used. I apologize for any typographical errors that were not detected and corrected.) Date: 09/17/2023; Time: 1:22 PM  Disclaimer:  Medicine is not an Visual merchandiser. The only guarantee in  medicine is that nothing is guaranteed. It is important to note that the decision to proceed with this intervention was based on the information collected from the patient. The Data and conclusions were drawn from the patient's questionnaire, the interview, and the physical examination. Because the information was provided in large part by the patient, it cannot be guaranteed that it has not been purposely or unconsciously manipulated. Every effort has been made to obtain as much relevant data as possible for this evaluation. It is important to note that the conclusions that lead to this procedure are derived in large part from the available data. Always take into account that the treatment will also be dependent on availability of resources and existing treatment guidelines, considered by other Pain Management Practitioners as being common knowledge and practice, at the time of the intervention. For Medico-Legal purposes, it is also important to point out that variation in procedural techniques and pharmacological choices are the acceptable norm. The indications, contraindications, technique, and results of the above procedure should only be interpreted and judged by a Board-Certified Interventional Pain Specialist with extensive familiarity and expertise in the same exact procedure and technique.

## 2023-09-17 NOTE — Progress Notes (Signed)
 Safety precautions to be maintained throughout the outpatient stay will include: orient to surroundings, keep bed in low position, maintain call bell within reach at all times, provide assistance with transfer out of bed and ambulation.

## 2023-09-18 ENCOUNTER — Encounter: Payer: Self-pay | Admitting: Cardiovascular Disease

## 2023-09-18 ENCOUNTER — Telehealth: Payer: Self-pay | Admitting: *Deleted

## 2023-09-18 ENCOUNTER — Telehealth: Payer: Self-pay | Admitting: Cardiovascular Disease

## 2023-09-18 MED ORDER — OMEPRAZOLE 40 MG PO CPDR: 40.0000 mg | DELAYED_RELEASE_CAPSULE | Freq: Two times a day (BID) | ORAL | 1 refills | Status: DC

## 2023-09-18 NOTE — Telephone Encounter (Signed)
 The patient was called and reports feeling fine att - pt is at med appt with his wife.  Pt corroborated triage call note and reports suspicion of high HR and BP as a reaction to the steroid injection from yesterday.  Pt reports typical BP/HR of 110/65 - 72.    Pt reports taking all meds as prescribed and pt given ED precautions.    Pt reports he'll message us  with another BP once he gets home.

## 2023-09-18 NOTE — Telephone Encounter (Signed)
 Post procedure call; patient reports fast heart and thinks that this could be r/t medications during procedure on yesterday. States that the procedure was so painful on yesterday and he became so tense that he is wondering if this kicked him into afib.  States he can always tell if this happens and does not have those same s/s of afib.   Reports that heart rate is 124 bpm.  He has hx of afib and takes eliquis  along with BP medicines.  He denies any chest pain.  Is having trouble with inidgestion and needs to get his prilosec refilled, trying to contact PCP for this.  Told patient that he could go to ED to be checked out.  He reports he has a lot going on and has appts for his wife today that have to be looked at.   I did speak with Seema and she also recommends that he should have this checked on whether he goes to ED or another provider, he should have heart rate checked.  Patient verbalizes u/o information.

## 2023-09-18 NOTE — Telephone Encounter (Signed)
 Pt c/o BP issue: STAT if pt c/o blurred vision, one-sided weakness or slurred speech  1. What are your last 5 BP readings? 170/94 HR 124 last night, this morning 145/74 HR 84, now its 155/76 HR 86  2. Are you having any other symptoms (ex. Dizziness, headache, blurred vision, passed out)? Some dizziness when it was higher but pt states not right now since taking his BP medication it's still going down.   3. What is your BP issue? Pt is requesting a callback from nurse regarding him having concerns about his BP being so elevated. His daughter is worried that it has something to do with his stent. Please advise.

## 2023-09-19 NOTE — Telephone Encounter (Signed)
 I agree that elevated blood pressure was likely due to steroid injection.  I suggest that he monitors his blood pressure for now.  His last blood pressure in the office was controlled.

## 2023-09-19 NOTE — Telephone Encounter (Signed)
 The patient has been made aware and stated that everything is back to normal.

## 2023-09-20 ENCOUNTER — Ambulatory Visit

## 2023-09-20 DIAGNOSIS — M6281 Muscle weakness (generalized): Secondary | ICD-10-CM

## 2023-09-20 DIAGNOSIS — R262 Difficulty in walking, not elsewhere classified: Secondary | ICD-10-CM

## 2023-09-24 ENCOUNTER — Encounter

## 2023-09-25 ENCOUNTER — Ambulatory Visit: Payer: Self-pay

## 2023-09-26 ENCOUNTER — Ambulatory Visit: Attending: Neurology

## 2023-09-26 DIAGNOSIS — M6281 Muscle weakness (generalized): Secondary | ICD-10-CM | POA: Insufficient documentation

## 2023-09-26 DIAGNOSIS — R262 Difficulty in walking, not elsewhere classified: Secondary | ICD-10-CM | POA: Insufficient documentation

## 2023-09-27 ENCOUNTER — Other Ambulatory Visit: Payer: Self-pay | Admitting: Student in an Organized Health Care Education/Training Program

## 2023-09-27 DIAGNOSIS — R0602 Shortness of breath: Secondary | ICD-10-CM

## 2023-09-27 DIAGNOSIS — J449 Chronic obstructive pulmonary disease, unspecified: Secondary | ICD-10-CM

## 2023-09-30 NOTE — Patient Instructions (Signed)

## 2023-10-01 NOTE — Therapy (Signed)
 OUTPATIENT PHYSICAL THERAPY BALANCE TREATMENT NOTE   Patient Name: Sean Reyes. MRN: [id number] DOB:1941/01/23, 83 y.o., male Today's Date: 10/04/2023  END OF SESSION:  PT End of Session - 10/04/23 1402     Visit Number 27    Number of Visits 49    Date for PT Re-Evaluation 11/15/23    Authorization Type eval: 05/29/23;    Authorization Time Period Medicare A&B 2025  VL: Based on [place]  No auth req    PT Start Time 1355    PT Stop Time 1440    PT Time Calculation (min) 45 min    Equipment Utilized During Treatment Gait belt    Activity Tolerance Patient tolerated treatment well;Patient limited by fatigue    Behavior During Therapy WFL for tasks assessed/performed           Past Medical History:  Diagnosis Date   Anemia    Anxiety    Arthritis    Arthritis of neck    Atrial fibrillation (HCC)    Cataracts, bilateral    Complication of anesthesia    pt reports low BP's after surgery at [facility] and difficulty awakening   Depression    Diabetes (HCC)    dx 6-8 yrs ago   Dysrhythmia    a-fib   GERD (gastroesophageal reflux disease)    OCC TAKES ALKA SELTZER   History of kidney stones    10-15 yrs ago   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   Hyperlipidemia    Hypertension    Myocardial infarction (HCC) 12/2020   Nocturia    S/P ablation of atrial fibrillation    Ablative therapy   Sleep apnea    CPAP    Spinal stenosis    Tachycardia, unspecified    Past Surgical History:  Procedure Laterality Date   ABLATION     ANTERIOR LAT LUMBAR FUSION N/A 06/27/2017   Procedure: Anterior Lateral Lumbar Interbody  Fusion - Lumbar Two-Lumbar Three - Lumbar Three-Lumbar Four, Posterior Lumbar Interbody Fusion Lumbar Four- Five;  Surgeon: Onetha Kuba, MD;  Location: [facility] OR;  Service: Neurosurgery;  Laterality: N/A;  Anterior Lateral Lumbar Interbody  Fusion - Lumbar Two-Lumbar Three - Lumbar Three-Lumbar Four, Posterior Lumbar Interbody Fusion Lumbar Four-  Five   BACK SURGERY     CARDIAC CATHETERIZATION     CARDIOVERSION N/A 08/29/2018   Procedure: CARDIOVERSION;  Surgeon: Hester Wolm PARAS, MD;  Location: [facility] ORS;  Service: Cardiovascular;  Laterality: N/A;   CARDIOVERSION N/A 09/24/2018   Procedure: CARDIOVERSION;  Surgeon: Hester Wolm PARAS, MD;  Location: [facility] ORS;  Service: Cardiovascular;  Laterality: N/A;   COLONOSCOPY WITH PROPOFOL  N/A 10/05/2015   Procedure: COLONOSCOPY WITH PROPOFOL ;  Surgeon: Gladis RAYMOND Mariner, MD;  Location: [facility] ENDOSCOPY;  Service: Endoscopy;  Laterality: N/A;   COLONOSCOPY WITH PROPOFOL  N/A 11/01/2020   Procedure: COLONOSCOPY WITH PROPOFOL ;  Surgeon: Therisa Bi, MD;  Location: [facility] ENDOSCOPY;  Service: Gastroenterology;  Laterality: N/A;   CORONARY STENT INTERVENTION N/A 12/22/2020   Procedure: CORONARY STENT INTERVENTION;  Surgeon: Ammon Blunt, MD;  Location: [facility] INVASIVE CV LAB;  Service: Cardiovascular;  Laterality: N/A;   ESOPHAGOGASTRODUODENOSCOPY N/A 11/01/2020   Procedure: ESOPHAGOGASTRODUODENOSCOPY (EGD);  Surgeon: Therisa Bi, MD;  Location: [facility] ENDOSCOPY;  Service: Gastroenterology;  Laterality: N/A;   ESOPHAGOGASTRODUODENOSCOPY (EGD) WITH PROPOFOL  N/A 04/01/2018   Procedure: ESOPHAGOGASTRODUODENOSCOPY (EGD) WITH PROPOFOL ;  Surgeon: Mariner Gladis RAYMOND, MD;  Location: [facility] ENDOSCOPY;  Service: Endoscopy;  Laterality: N/A;   ESOPHAGOGASTRODUODENOSCOPY (  EGD) WITH PROPOFOL  N/A 06/01/2022   Procedure: ESOPHAGOGASTRODUODENOSCOPY (EGD) WITH PROPOFOL ;  Surgeon: Therisa Bi, MD;  Location: [facility] ENDOSCOPY;  Service: Gastroenterology;  Laterality: N/A;   EYE SURGERY     HERNIA REPAIR     JOINT REPLACEMENT Bilateral    hips  RT+  LEFT X2    LEFT HEART CATH AND CORONARY ANGIOGRAPHY N/A 12/22/2020   Procedure: LEFT HEART CATH AND CORONARY ANGIOGRAPHY;  Surgeon: Ammon Blunt, MD;  Location: [facility] INVASIVE CV LAB;  Service: Cardiovascular;  Laterality: N/A;   LEFT HEART CATH AND CORONARY ANGIOGRAPHY  Left 08/23/2021   Procedure: LEFT HEART CATH AND CORONARY ANGIOGRAPHY;  Surgeon: Hester Wolm PARAS, MD;  Location: [facility] INVASIVE CV LAB;  Service: Cardiovascular;  Laterality: Left;   LUMBAR LAMINECTOMY/DECOMPRESSION MICRODISCECTOMY Left 09/13/2016   Procedure: Microdiscectomy - Lumbar two-three,  Lumbar three- - left;  Surgeon: Onetha Kuba, MD;  Location: [facility] OR;  Service: Neurosurgery;  Laterality: Left;   SPINAL CORD STIMULATOR INSERTION  07/08/2019   TONSILLECTOMY     Patient Active Problem List   Diagnosis Date Noted   Lip swelling 07/04/2023   Diabetes mellitus treated with oral medication (HCC) 04/05/2023   Subareolar mass of left breast 10/03/2022   COPD (chronic obstructive pulmonary disease) (HCC) 10/31/2021   Shortness of breath on exertion 08/04/2021   Diabetes mellitus with proteinuria (HCC) 04/04/2021   GERD without esophagitis 04/02/2021   Coronary artery disease 12/28/2020   History of non-ST elevation myocardial infarction (NSTEMI) 12/21/2020   Pain in right shin 10/25/2020   Peripheral vascular disease (HCC) 08/06/2020   Chronic pain syndrome 07/06/2020   Cervical facet joint syndrome 04/08/2020   Spinal cord stimulator status 12/11/2019   Elevated TSH 08/20/2019   CKD (chronic kidney disease) stage 3, GFR 30-59 ml/min (HCC) 01/19/2019   Acquired thrombophilia (HCC) 01/19/2019   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   Postlaminectomy syndrome, lumbar region 01/16/2019   History of fusion of lumbar spine (L2-L5) 01/16/2019   Chronic radicular lumbar pain 01/16/2019   HNP (herniated nucleus pulposus), lumbar 04/29/2018   Advanced care planning/counseling discussion 09/28/2016   Spinal stenosis, lumbar region, 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/13/2016   Hyperlipidemia associated with type 2 diabetes mellitus (HCC) 07/14/2015   Symptomatic anemia 06/30/2015   Benign prostatic hyperplasia without lower urinary tract symptoms 06/02/2015   OSA (obstructive sleep apnea)  03/23/2015   Hypertension associated with diabetes (HCC) 09/28/2014   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HCC) 09/28/2014   H/O prior ablation treatment 10/19/2011   AF (paroxysmal atrial fibrillation) (HCC) 10/19/2011   PCP: Valerio Melanie DASEN, NP  REFERRING PROVIDER: Maree Jannett POUR, MD   REFERRING DIAG: Peripheral Neuropathy  RATIONALE FOR EVALUATION AND TREATMENT: Rehabilitation  THERAPY DIAG: Muscle weakness (generalized)  Difficulty in walking, not elsewhere classified  ONSET DATE: 01/25/23 (acute on chronic)  FOLLOW-UP APPT SCHEDULED WITH REFERRING PROVIDER: Yes   FROM INITIAL EVALUATION SUBJECTIVE:  SUBJECTIVE STATEMENT:  Imbalance and LE weakness;  PERTINENT HISTORY:  Lower extremity weakness in patient with 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 He reports weakness in both legs and numbness in toes post surgery in 2018. He experienced a fall on 01/25/2023 at home, resulting in a L hip injury. No residual pain in hip at this time. No additional falls since December. He reports progressive LE weakness impairing his functional ability at home. He is known to this clinic from prior episodes of care for similar concerns.  Prior history: 11/02/22 Patient reports to physical therapy today with a chief concern of muscle weakness and imbalance. Patient reports progressive worsening in balance and muscular strength. He states that he has loss of sensation in his toes he reports they  are asleep. Patient reports pain in the anterior thigh and bilateral calves. He currently uses an loftstand crutch in his RUE. Lately patient has reported difficulty with dressing LE, performing fine motor movements with left hand. He denies falls, saddle parasthesia, nausea, vomitting, night sweats.    10/17/2022 Patient reported he is not doing well lately. He has been seeing Dr. Clois. He reports his legs are so weak that he can hardly walk, so he has been using a crutch for the last 1-2 years. Reported numbness and loss of sensation in his toes. He also reported that recently his left hand has began to has some numbness to where it feels like it is asleep. Denies pain, loss of sensation, coldness, burning. Denies falls.  Imaging: NCS conducted on 11/12/2018: Abnormal study. 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 There is also preliminary evidence of a superimposed left lower lumbosacral polyradiculopathy, based on NCV and distal needle exam results. I cannot test lumbar paraspinals due to patient on Eliquis .    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 Yes. 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  Recent memory is intact.  Remote memory is intact.  Attention span and concentration are intact.  Expressive speech is intact.  Patient's fund of knowledge is  within normal limits for educational level.    Gross Musculoskeletal Assessment Tremor: None Bulk: Normal Tone: Normal  Posture: Forward head and rounded shoulders  AROM Deferred specific measurements. Functional motion intact;  LE MMT: MMT (out of 5) Right  Left   Hip flexion 4 4+  Hip extension    Hip abduction (seated) 3+ 3+  Hip adduction (seated 3+ 3+  Hip internal rotation    Hip external rotation    Knee flexion (seated) 4 4  Knee extension 4 4  Ankle dorsiflexion 4 4  Ankle plantarflexion    Ankle inversion    Ankle eversion    (* = pain; Blank rows = not tested)  Transfers: Assistive device utilized: Single Lofstrand RUE  Sit to stand: Modified independence Stand to sit: Modified independence Chair to chair: Modified independence Floor: Deferred  Stairs: Level of Assistance: CGA Stair Negotiation Technique: Alternating Pattern  with Bilateral Rails Number of Stairs: 4  Height of Stairs: 6  Comments: Slow and labored ascending/descending. No overt LOB;  Gait: Gait pattern: decreased step length- Right, decreased step length- Left, trunk flexed, poor foot clearance- Right, and poor foot clearance- Left Distance walked: 150' over the course of evaluation Assistive device  utilized: Single Lofstrand in RUE Level of assistance: Modified independence Comments: Slow and labored ambulation. Decreased self selected speed  Functional Outcome Measures  Results Comments  BERG 40/56 Increased fall risk  DGI    FGA    TUG 22.2 seconds Increased fall risk  5TSTS Unable Increased fall risk  6 Minute Walk Test    10 Meter Gait Speed Self-selected: 20.5s = 0.49 m/s; Fastest: 18.0s = 0.56 m/s Below community ambulation speed  (Blank rows = not tested)   TODAY'S TREATMENT:    SUBJECTIVE: Patient reports ongoing BLE weakness and R shin pain from previous injury 30+ years ago. He saw Dr. Marcelino who performed a saphenous nerve block. The procedure was extremely painful and it made his pain worse. No falls or stumbles reported since last session. Pt has no questions upon arrival.    PAIN: Chronic low back pain. Sharp R shin pain.     Therapeutic Activity NuStep L1-4 x 10 minutes, for BLE strengthening and warm-up during interval history (5 mins unbilled);  Sit to stand with 6 # ball overhead press 2 x 10;  Standing hip strengthening with 3# ankle weights (AW): Hip flexion marches 2 x 20 BLE; Hip abduction x 20 BLE; Hip HS curl 2 x 20 BLE;  Hip extension x 20 BLE;   Seated LAQ with 3# ankle weights 2 x 20 BLE; Standing heel raises x 20 BLE; Seated clams with manual resistance from therapist 2 x 20; Seated adductor squeezes with manual resistance from therapist 2 x 20;   Not performed this session: Fwd step over 6 hurdles in // bars x 3 laps  Standing on AE pad, Alternating Basketball chest press and bounce pass with PT, 2 x 3' Mini squats 2 x 20; SLS hip 3 way cone taps 2 x 8 ea leg; Nautilus resisted gait 60# forward and backward x 3 each, R lateral and L lateral x 2 each, repeated rest breaks due to fatigue, pt performs slowly; Cross-over stepping in // bars without UE support x multiple lengths; Forward 6 step-ups alternating leading LE x 10  each;   PATIENT EDUCATION:  Education details: Pt educated throughout session about proper posture and technique with exercises. Improved exercise technique, movement at target joints, use of target  muscles after min to mod verbal, visual, tactile cues.  Person educated: Patient Education method: Explanation, Verbal cues, and Handouts Education comprehension: verbalized understanding and returned demonstration   HOME EXERCISE PROGRAM:  Access Code: [email or web address] URL: [email or web address] Date: 05/31/2023 Prepared by: Selinda Eck  Exercises - Seated Long Arc Quad  - 1 x daily - 3-4 x weekly - 3 sets - 10 reps - 2s hold - Seated Hip Abduction with Resistance  - 1 x daily - 3-4 x weekly - 3 sets - 10 reps - 2s hold - Heel Raises with Counter Support  - 1 x daily - 3-4 x weekly - 3 sets - 10 reps - 2s hold - Mini Squat with Counter Support  - 1 x daily - 3-4 x weekly - 3 sets - 10 reps - Semi-Tandem Balance at [employer] Eyes Open  - 1 x daily - 7 x weekly - 2-3 sets - 10 reps - 10-30 hold   ASSESSMENT:  CLINICAL IMPRESSION:   Patient arrives to treatment session very motivated to participate. Session focused on BLE strengthening today as he has not been able to come to therapy in almost a month. He continues to require multiple rest breaks throughout. Pt demonstrated appropriate effort throughout the session. Encouraged him to follow-up with Dr. Marcelino to discuss his response to the nerve block. He will benefit from PT services to address deficits in strength, balance, and mobility in order to improve function at home and decrease his risk for falls.     OBJECTIVE IMPAIRMENTS: Abnormal gait, decreased balance, difficulty walking, decreased strength, impaired sensation, and pain.   ACTIVITY LIMITATIONS: bending, standing, squatting, stairs, transfers, and caring for others  PARTICIPATION LIMITATIONS: meal prep, cleaning, laundry, shopping, and community activity  PERSONAL  FACTORS: Age, Past/current experiences, Time since onset of injury/illness/exacerbation, and 3+ comorbidities: generalized polyneuropathy, COPD, DM, NSTEMI, and spinal stenosis are also affecting patient's functional outcome.   REHAB POTENTIAL: Fair    CLINICAL DECISION MAKING: Unstable/unpredictable  EVALUATION COMPLEXITY: High   GOALS: Goals reviewed with patient? No  SHORT TERM GOALS: Target date: 07/10/2023  Pt will be independent with HEP in order to improve strength and balance in order to decrease fall risk and improve function at home. Baseline:  Goal status: MET   LONG TERM GOALS: Target date: 11/15/2023  Pt will improve ABC by at least 13% in order to demonstrate clinically significant improvement in balance confidence.  Baseline: 20%; 07/10/23: 30%, 08/13/23: 36.25% Goal status: ACHIEVED  2.  Pt will improve BERG by at least 3 points in order to demonstrate clinically significant improvement in balance.   Baseline: 40/56; 07/10/23: 50/56; 08/13/23: 52/56; Goal status: ACHIEVED  3.  Pt will increase self-selected to at least 0.6 m/s in order to demonstrate clinically significant improvement in limited community ambulation.         Baseline: self-selected: 0.49 m/s; 07/10/23: 0.52 m/s; 08/13/23: 0.49 m/s; Goal status: NOT MET  4. Pt will be able to complete 5TSTS without UE support in order to demonstrate clinically significant improvement in LE strength. Baseline: Unable to perform sit to stand without heavy UE assist; 07/10/23: 11.4s; 08/13/23: 12.72 s Goal status: ACHIEVED  5. Pt will decrease TUG to below 14 seconds/decrease in order to demonstrate decreased fall risk.  Baseline: 22.2s; 07/10/23: 14.0s; 08/13/23: 24.53s no crutch, 16.20s with crutch Goal status: ONGOING  PLAN: PT FREQUENCY: 2x/week  PT DURATION: 12 weeks  PLANNED INTERVENTIONS: Therapeutic exercises, Therapeutic activity, Neuromuscular re-education, Balance training, Gait  training, Patient/Family  education, Self Care, Joint mobilization, Joint manipulation, Vestibular training, Canalith repositioning, Orthotic/Fit training, DME instructions, Dry Needling, Electrical stimulation, Spinal manipulation, Spinal mobilization, Cryotherapy, Moist heat, Taping, Traction, Ultrasound, Ionotophoresis 4mg /ml Dexamethasone , Manual therapy, and Re-evaluation.  PLAN FOR NEXT SESSION: progress strengthening exercises, walking/ambulation speed and endurance tasks, modify/progress HEP as needed;   Severino Paolo D Jem Castro PT, DPT, GCS  4:18 PM,10/04/23

## 2023-10-04 ENCOUNTER — Ambulatory Visit

## 2023-10-04 DIAGNOSIS — R262 Difficulty in walking, not elsewhere classified: Secondary | ICD-10-CM

## 2023-10-04 DIAGNOSIS — M6281 Muscle weakness (generalized): Secondary | ICD-10-CM | POA: Diagnosis not present

## 2023-10-05 ENCOUNTER — Encounter: Payer: Self-pay | Admitting: Nurse Practitioner

## 2023-10-05 ENCOUNTER — Ambulatory Visit (INDEPENDENT_AMBULATORY_CARE_PROVIDER_SITE_OTHER): Admitting: Nurse Practitioner

## 2023-10-05 VITALS — BP 120/65 | HR 62 | Temp 97.8°F | Ht 72.8 in | Wt 186.2 lb

## 2023-10-05 DIAGNOSIS — E1143 Type 2 diabetes mellitus with diabetic autonomic (poly)neuropathy: Secondary | ICD-10-CM | POA: Diagnosis not present

## 2023-10-05 DIAGNOSIS — E119 Type 2 diabetes mellitus without complications: Secondary | ICD-10-CM

## 2023-10-05 DIAGNOSIS — D649 Anemia, unspecified: Secondary | ICD-10-CM

## 2023-10-05 DIAGNOSIS — I739 Peripheral vascular disease, unspecified: Secondary | ICD-10-CM

## 2023-10-05 DIAGNOSIS — R809 Proteinuria, unspecified: Secondary | ICD-10-CM | POA: Diagnosis not present

## 2023-10-05 DIAGNOSIS — J449 Chronic obstructive pulmonary disease, unspecified: Secondary | ICD-10-CM

## 2023-10-05 DIAGNOSIS — I480 Paroxysmal atrial fibrillation: Secondary | ICD-10-CM | POA: Diagnosis not present

## 2023-10-05 DIAGNOSIS — E785 Hyperlipidemia, unspecified: Secondary | ICD-10-CM | POA: Diagnosis not present

## 2023-10-05 DIAGNOSIS — E1169 Type 2 diabetes mellitus with other specified complication: Secondary | ICD-10-CM | POA: Diagnosis not present

## 2023-10-05 DIAGNOSIS — E1159 Type 2 diabetes mellitus with other circulatory complications: Secondary | ICD-10-CM

## 2023-10-05 DIAGNOSIS — N1831 Chronic kidney disease, stage 3a: Secondary | ICD-10-CM | POA: Diagnosis not present

## 2023-10-05 DIAGNOSIS — Z7984 Long term (current) use of oral hypoglycemic drugs: Secondary | ICD-10-CM | POA: Diagnosis not present

## 2023-10-05 DIAGNOSIS — E1129 Type 2 diabetes mellitus with other diabetic kidney complication: Secondary | ICD-10-CM

## 2023-10-05 DIAGNOSIS — D6869 Other thrombophilia: Secondary | ICD-10-CM

## 2023-10-05 DIAGNOSIS — I152 Hypertension secondary to endocrine disorders: Secondary | ICD-10-CM | POA: Diagnosis not present

## 2023-10-05 LAB — BAYER DCA HB A1C WAIVED: HB A1C (BAYER DCA - WAIVED): 7.1 % — ABNORMAL HIGH (ref 4.8–5.6)

## 2023-10-05 NOTE — Assessment & Plan Note (Signed)
 Refer to diabetes with proteinuria plan of care.

## 2023-10-05 NOTE — Assessment & Plan Note (Signed)
 Chronic, ongoing CKD 3a.  Continue to monitor closely and refer to nephrology if decline.  Continue Benazepril for kidney protection and proteinuria.  Renal dose medications as needed based on labs.  Would benefit from SGLT2, but refuses due to concern for increased urination which he already struggles with.

## 2023-10-05 NOTE — Assessment & Plan Note (Signed)
 Chronic, stable.  Continue collaboration with hematology on as needed basis, recent notes and labs reviewed.  Obtain labs today.

## 2023-10-05 NOTE — Assessment & Plan Note (Signed)
 Chronic, ongoing with A1c 7.1% today, same as last check, and urine ALB 150 (February 2025). Continue current diabetes medication regimen and adjust as needed. Recommend he check BS at least 3 times daily.  Continue Benazepril  for kidney protection. Discussed possible change to Jardiance vs Metformin  for kidney and heart health, but too costly and he does not want to urinate more.  - Vaccinations up to date - ACE and Statin on board - Foot and eye exam up to date.

## 2023-10-05 NOTE — Assessment & Plan Note (Signed)
Chronic, ongoing.  Continue current medication regimen, tolerating 5 MG Crestor well without ADR, and adjust as needed.  Lipid panel today.

## 2023-10-05 NOTE — Progress Notes (Signed)
 BP 120/65   Pulse 62   Temp 97.8 F (36.6 C) (Oral)   Ht 6' 0.8 (1.849 m)   Wt 186 lb 3.2 oz (84.5 kg)   SpO2 98%   BMI 24.70 kg/m    Subjective:    Patient ID: Sean Clancy Raddle., male    DOB: September 19, 1940, 83 y.o.   MRN: [id number]  HPI: Sean Reyes. is a 83 y.o. male  Chief Complaint  Patient presents with   Diabetes   Hyperlipidemia   Hypertension   Atrial Fibrillation   Pain   DIABETES WITH NEUROPATHY May A1c 7.1% in May. Taking Metformin .  Has cut back on ice cream to two times a week. Cardiology recommended SGLT2 in past, although patient concerned with this due to BPH and his frequent urination at baseline.  Hypoglycemic episodes:no Polydipsia/polyuria: no Visual disturbance: no Chest pain: no Paresthesias: no Glucose Monitoring: yes  Accucheck frequency: 2-3 times a week  Fasting glucose: 140 range  Post prandial:  Evening:  Before meals: Taking Insulin ?: no  Long acting insulin :  Short acting insulin : Blood Pressure Monitoring: daily Retinal Examination: Up To Date -- Dr. Launie in [place] Foot Exam: Up to Date Pneumovax: Up to Date Influenza: Up to Date Aspirin : no    HYPERTENSION / HYPERLIPIDEMIA Continues on Amlodipine , Benazepril , and Rosuvastatin . Saw Dr. Darron on 08/14/23, no changes made. Continues CPAP and is using more, feels better due to this.  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  Satisfied with current treatment? yes Duration of hypertension: chronic BP monitoring frequency: daily BP range: this morning 129/75 BP medication side effects: no Duration of hyperlipidemia: chronic Cholesterol medication side effects: no Cholesterol supplements: none Medication compliance: good compliance Aspirin : no Recent stressors: no Recurrent headaches: no Visual changes: no Palpitations: no Dyspnea: at baseline with activity, uses inhalers Chest pain: no Lower extremity edema: no Dizzy/lightheaded: no  COPD Uses  Anoro COPD status: stable Satisfied with current treatment?: yes Oxygen use: no Dyspnea frequency: at baseline with activity, uses inhalers Cough frequency: none Rescue inhaler frequency:  none Limitation of activity: no Productive cough: none Last Spirometry: with pulmonary Pneumovax: Up to Date Influenza: Up to Date  ATRIAL FIBRILLATION Atrial fibrillation status: stable Satisfied with current treatment: yes  Medication side effects:  no Medication compliance: good compliance Etiology of atrial fibrillation: unknown Palpitations: as above Chest pain:  no Dyspnea on exertion: as above Orthopnea:  no Syncope:  no Edema:  no Ventricular rate control: was on BB, but had to stop due to bradycardia Anti-coagulation: long acting   CHRONIC KIDNEY DISEASE (CKD 3a) & ANEMIA Stable on recent labs. CKD status: stable Medications renally dose: yes Previous renal evaluation: no Pneumovax:  Up to Date Influenza Vaccine:  Up to Date   CHRONIC PAIN  Pain stimulator in place. 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 Saw neurology last on 07/25/23.  Is currently going to PT. In past tried Lyrica , Cymbalta, and Gabapentin  without benefit. Saw pain clinic on 08/28/23 when received an injection to right shin area of pain, he reports pain is worse now. Pain control status: stable Duration: years Location: back and legs Quality: dull, aching, and throbbing Current Pain Level: 7/10 walking into here today, sitting 3/10 Previous Pain Level: moderate 7-8/10 Breakthrough pain: no What Activities task can be accomplished with current medication? yes Interested in weaning off narcotics:no   Stool softners/OTC fiber: no  Previous pain specialty evaluation: yes Non-narcotic analgesic  meds: no Narcotic contract: no  Relevant past medical, surgical, family and social history reviewed and updated as indicated. Interim medical history since our last visit  reviewed. Allergies and medications reviewed and updated.  Review of Systems  Constitutional:  Negative for activity change, appetite change, diaphoresis, fatigue and fever.  Respiratory:  Negative for cough, chest tightness, shortness of breath and wheezing.   Cardiovascular:  Negative for chest pain, palpitations and leg swelling.  Gastrointestinal: Negative.   Endocrine: Negative for cold intolerance, heat intolerance, polydipsia, polyphagia and polyuria.  Neurological: Negative.   Psychiatric/Behavioral: Negative.     Per HPI unless specifically indicated above     Objective:    BP 120/65   Pulse 62   Temp 97.8 F (36.6 C) (Oral)   Ht 6' 0.8 (1.849 m)   Wt 186 lb 3.2 oz (84.5 kg)   SpO2 98%   BMI 24.70 kg/m   Wt Readings from Last 3 Encounters:  10/05/23 186 lb 3.2 oz (84.5 kg)  09/17/23 187 lb (84.8 kg)  08/28/23 187 lb (84.8 kg)    Physical Exam Vitals and nursing note reviewed.  Constitutional:      General: He is awake. He is not in acute distress.    Appearance: Normal appearance. He is well-developed and well-groomed. He is not ill-appearing or toxic-appearing.     Comments: Gait stable with his cane support.  HENT:     Head: Normocephalic.     Right Ear: Hearing and external ear normal.     Left Ear: Hearing and external ear normal.     Mouth/Throat:     Lips: Pink.     Mouth: Mucous membranes are moist.  Eyes:     General: Lids are normal.     Extraocular Movements: Extraocular movements intact.     Conjunctiva/sclera: Conjunctivae normal.  Neck:     Thyroid : No thyromegaly.     Vascular: No carotid bruit.  Cardiovascular:     Rate and Rhythm: Normal rate. Rhythm irregularly irregular.     Heart sounds: Normal heart sounds. No murmur heard.    No gallop.  Pulmonary:     Effort: No accessory muscle usage or respiratory distress.     Breath sounds: Normal breath sounds.  Abdominal:     General: Bowel sounds are normal. There is no distension.      Palpations: Abdomen is soft.     Tenderness: There is no abdominal tenderness.  Musculoskeletal:     Cervical back: Full passive range of motion without pain.     Right lower leg: No edema.     Left lower leg: No edema.  Lymphadenopathy:     Cervical: No cervical adenopathy.  Skin:    General: Skin is warm.     Capillary Refill: Capillary refill takes less than 2 seconds.  Neurological:     Mental Status: He is alert and oriented to person, place, and time.     Deep Tendon Reflexes: Reflexes are normal and symmetric.     Reflex Scores:      Brachioradialis reflexes are 2+ on the right side and 2+ on the left side.      Patellar reflexes are 2+ on the right side and 2+ on the left side. Psychiatric:        Attention and Perception: Attention normal.        Mood and Affect: Mood normal.        Speech: Speech normal.  Behavior: Behavior normal. Behavior is cooperative.        Thought Content: Thought content normal.    Results for orders placed or performed in visit on 10/05/23  Bayer DCA Hb A1c Waived   Collection Time: 10/05/23  3:39 PM  Result Value Ref Range   HB A1C (BAYER DCA - WAIVED) 7.1 (H) 4.8 - 5.6 %   *Note: Due to a large number of results and/or encounters for the requested time period, some results have not been displayed. A complete set of results can be found in Results Review.      Assessment & Plan:   Problem List Items Addressed This Visit       Cardiovascular and Mediastinum   Peripheral vascular disease (HCC)   Chronic, stable.  To bilateral lower extremity.  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  Continue ASA daily and monitor closely for any wounds.  Return to vascular as needed.      Relevant Orders   Comprehensive metabolic panel with GFR   Lipid Panel w/o Chol/HDL Ratio   Hypertension associated with diabetes (HCC)   Chronic, ongoing with A1c 7.1% today, same as previous check.  BP at goal today. Recommend he monitor BP at least a few mornings a week  at home and document.  DASH diet at home.  Continue current medication regimen and adjust as needed.  Labs today: CMP.        Relevant Orders   Bayer DCA Hb A1c Waived (Completed)   AF (paroxysmal atrial fibrillation) (HCC)   Chronic, ongoing.  Followed by cardiology at this time.  Continue current medication regimen and collaboration, appreciate their input.  HR with stable rate today.        Respiratory   COPD (chronic obstructive pulmonary disease) (HCC)   Chronic, stable.  Followed by pulmonary at this time, continue this collaboration.  He is using Anoro without issue.  Reports some improvement in SOB, but continues to have this with heavier exertion.        Endocrine   Hyperlipidemia associated with type 2 diabetes mellitus (HCC)   Chronic, ongoing.  Continue current medication regimen, tolerating 5 MG Crestor  well without ADR, and adjust as needed.  Lipid panel today.      Relevant Orders   Bayer DCA Hb A1c Waived (Completed)   Comprehensive metabolic panel with GFR   Lipid Panel w/o Chol/HDL Ratio   Diabetes mellitus with proteinuria (HCC) - Primary   Chronic, ongoing with A1c 7.1% today, same as last check, and urine ALB 150 (February 2025). Continue current diabetes medication regimen and adjust as needed. Recommend he check BS at least 3 times daily.  Continue Benazepril  for kidney protection. Discussed possible change to Jardiance vs Metformin  for kidney and heart health, but too costly and he does not want to urinate more.  - Vaccinations up to date - ACE and Statin on board - Foot and eye exam up to date.      Relevant Orders   Bayer DCA Hb A1c Waived (Completed)   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HCC)   Chronic, ongoing with A1c 7.1% today, same as last check, and urine ALB 150 (February 2025).  Significant decreased sensation bilateral feet, monitor closely for wounds and falls.  Continue current diabetes medication regimen and adjust as needed. He did  not get benefit from Lyrica , Duloxetine, or Gabapentin  in past, will continue collaboration with neurology and pain clinic as needed.  Continue to monitor BS daily and document for  visits. LABS: A1c and CMP. Does find benefit from performing physical therapy, but continues to struggle with discomfort. - Statin and ACE on board - Vaccinations up to date - Foot and eye exams up to date      Relevant Orders   Bayer DCA Hb A1c Waived (Completed)   Diabetes mellitus treated with oral medication (HCC)   Refer to diabetes with proteinuria plan of  care.      Relevant Orders   Bayer DCA Hb A1c Waived (Completed)     Genitourinary   CKD (chronic kidney disease) stage 3, GFR 30-59 ml/min (HCC)   Chronic, ongoing CKD 3a.  Continue to monitor closely and refer to nephrology if decline.  Continue Benazepril  for kidney protection and proteinuria.  Renal dose medications as needed based on labs.  Would benefit from SGLT2, but refuses due to concern for increased urination which he already struggles with.      Relevant Orders   Comprehensive metabolic panel with GFR     Hematopoietic and Hemostatic   Acquired thrombophilia (HCC)   Patient on Eliquis  with A-fib, monitor CBC regularly.      Relevant Orders   CBC with Differential/Platelet     Other   Symptomatic anemia   Chronic, stable.  Continue collaboration with hematology on as needed basis, recent notes and labs reviewed.  Obtain labs today.      Relevant Orders   CBC with Differential/Platelet   Ferritin   Iron      Follow up plan: Return in about 3 months (around 01/05/2024) for T2DM, HTN/HLD, CKD, A-FIB, ANEMIA, PAIN.

## 2023-10-05 NOTE — Assessment & Plan Note (Signed)
 Chronic, ongoing.  Followed by cardiology at this time.  Continue current medication regimen and collaboration, appreciate their input.  HR with stable rate today.

## 2023-10-05 NOTE — Assessment & Plan Note (Signed)
Patient on Eliquis with A-fib, monitor CBC regularly.

## 2023-10-05 NOTE — Assessment & Plan Note (Signed)
Chronic, stable.  To bilateral lower extremity.  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  Continue ASA daily and monitor closely for any wounds.  Return to vascular as needed. 

## 2023-10-05 NOTE — Assessment & Plan Note (Signed)
 Chronic, ongoing with A1c 7.1% today, same as previous check.  BP at goal today. Recommend he monitor BP at least a few mornings a week at home and document.  DASH diet at home.  Continue current medication regimen and adjust as needed.  Labs today: CMP.

## 2023-10-05 NOTE — Assessment & Plan Note (Signed)
 Chronic, ongoing with A1c 7.1% today, same as last check, and urine ALB 150 (February 2025).  Significant decreased sensation bilateral feet, monitor closely for wounds and falls.  Continue current diabetes medication regimen and adjust as needed. He did not get benefit from Lyrica , Duloxetine, or Gabapentin  in past, will continue collaboration with neurology and pain clinic as needed.  Continue to monitor BS daily and document for visits. LABS: A1c and CMP. Does find benefit from performing physical therapy, but continues to struggle with discomfort. - Statin and ACE on board - Vaccinations up to date - Foot and eye exams up to date

## 2023-10-05 NOTE — Assessment & Plan Note (Signed)
 Chronic, stable.  Followed by pulmonary at this time, continue this collaboration.  He is using Anoro without issue.  Reports some improvement in SOB, but continues to have this with heavier exertion.

## 2023-10-06 ENCOUNTER — Ambulatory Visit: Payer: Self-pay | Admitting: Nurse Practitioner

## 2023-10-06 LAB — COMPREHENSIVE METABOLIC PANEL WITH GFR
ALT: 13 IU/L (ref 0–44)
AST: 17 IU/L (ref 0–40)
Albumin: 4.2 g/dL (ref 3.7–4.7)
Alkaline Phosphatase: 66 IU/L (ref 44–121)
BUN/Creatinine Ratio: 19 (ref 10–24)
BUN: 21 mg/dL (ref 8–27)
Bilirubin Total: 0.4 mg/dL (ref 0.0–1.2)
CO2: 20 mmol/L (ref 20–29)
Calcium: 9.5 mg/dL (ref 8.6–10.2)
Chloride: 101 mmol/L (ref 96–106)
Creatinine, Ser: 1.13 mg/dL (ref 0.76–1.27)
Globulin, Total: 2.5 g/dL (ref 1.5–4.5)
Glucose: 128 mg/dL — ABNORMAL HIGH (ref 70–99)
Potassium: 4.8 mmol/L (ref 3.5–5.2)
Sodium: 137 mmol/L (ref 134–144)
Total Protein: 6.7 g/dL (ref 6.0–8.5)
eGFR: 65 mL/min/1.73 (ref >59)

## 2023-10-06 LAB — FERRITIN: Ferritin: 224 ng/mL (ref 30–400)

## 2023-10-06 LAB — CBC WITH DIFFERENTIAL/PLATELET
Basophils Absolute: 0 x10E3/uL (ref 0.0–0.2)
Basos: 0 %
EOS (ABSOLUTE): 0 x10E3/uL (ref 0.0–0.4)
Eos: 1 %
Hematocrit: 35.6 % — ABNORMAL LOW (ref 37.5–51.0)
Hemoglobin: 11.6 g/dL — ABNORMAL LOW (ref 13.0–17.7)
Immature Grans (Abs): 0 x10E3/uL (ref 0.0–0.1)
Immature Granulocytes: 0 %
Lymphocytes Absolute: 1.9 x10E3/uL (ref 0.7–3.1)
Lymphs: 34 %
MCH: 31.6 pg (ref 26.6–33.0)
MCHC: 32.6 g/dL (ref 31.5–35.7)
MCV: 97 fL (ref 79–97)
Monocytes Absolute: 0.7 x10E3/uL (ref 0.1–0.9)
Monocytes: 12 %
Neutrophils Absolute: 2.9 x10E3/uL (ref 1.4–7.0)
Neutrophils: 53 %
Platelets: 169 x10E3/uL (ref 150–450)
RBC: 3.67 x10E6/uL — ABNORMAL LOW (ref 4.14–5.80)
RDW: 13.5 % (ref 11.6–15.4)
WBC: 5.5 x10E3/uL (ref 3.4–10.8)

## 2023-10-06 LAB — LIPID PANEL W/O CHOL/HDL RATIO
Cholesterol, Total: 88 mg/dL — ABNORMAL LOW (ref 100–199)
HDL: 39 mg/dL — ABNORMAL LOW (ref >39)
LDL Chol Calc (NIH): 29 mg/dL (ref 0–99)
Triglycerides: 104 mg/dL (ref 0–149)
VLDL Cholesterol Cal: 20 mg/dL (ref 5–40)

## 2023-10-06 LAB — IRON: Iron: 84 ug/dL (ref 38–169)

## 2023-10-08 ENCOUNTER — Other Ambulatory Visit: Payer: Self-pay | Admitting: Student in an Organized Health Care Education/Training Program

## 2023-10-08 DIAGNOSIS — J449 Chronic obstructive pulmonary disease, unspecified: Secondary | ICD-10-CM

## 2023-10-08 DIAGNOSIS — R0602 Shortness of breath: Secondary | ICD-10-CM

## 2023-10-08 MED ORDER — UMECLIDINIUM-VILANTEROL 62.5-25 MCG/ACT IN AEPB
1.0000 | INHALATION_SPRAY | Freq: Every day | RESPIRATORY_TRACT | 1 refills | Status: DC
Start: 2023-10-08 — End: 2023-12-13

## 2023-10-08 NOTE — Telephone Encounter (Signed)
 Copied from CRM #[id number]. Topic: Clinical - Medication Refill >> Oct 08, 2023  9:28 AM Celestine FALCON wrote: Medication: umeclidinium-vilanterol (ANORO ELLIPTA ) 62.5-25 MCG/ACT AEPB  Pt stated he was told he needed an appt before it would befilled, but the soonest available appt is on 12/06/2023 with Dr. Isadora. Pt doesn't want to go the entire month of September without medication.  Has the patient contacted their pharmacy? Yes (Agent: If no, request that the patient contact the pharmacy for the refill. If patient does not wish to contact the pharmacy document the reason why and proceed with request.) (Agent: If yes, when and what did the pharmacy advise?)  This is the patient's preferred pharmacy:  [facility] - [place], [place] - [address] [place] [address] Phone: [phone] Fax: [phone]  Is this the correct pharmacy for this prescription? Yes If no, delete pharmacy and type the correct one.   Has the prescription been filled recently? Yes  Is the patient out of the medication? Yes  Has the patient been seen for an appointment in the last year OR does the patient have an upcoming appointment? Yes  Can we respond through MyChart? Yes  Agent: Please be advised that Rx refills may take up to 3 business days. We ask that you follow-up with your pharmacy.

## 2023-10-10 ENCOUNTER — Encounter: Payer: Self-pay | Admitting: Urology

## 2023-10-11 ENCOUNTER — Ambulatory Visit: Attending: Student in an Organized Health Care Education/Training Program | Admitting: Nurse Practitioner

## 2023-10-11 ENCOUNTER — Encounter

## 2023-10-11 ENCOUNTER — Encounter: Payer: Self-pay | Admitting: Nurse Practitioner

## 2023-10-11 VITALS — BP 129/73 | HR 72 | Temp 98.1°F | Resp 20 | Ht 73.0 in | Wt 187.0 lb

## 2023-10-11 DIAGNOSIS — M5416 Radiculopathy, lumbar region: Secondary | ICD-10-CM | POA: Insufficient documentation

## 2023-10-11 DIAGNOSIS — G8929 Other chronic pain: Secondary | ICD-10-CM | POA: Insufficient documentation

## 2023-10-11 DIAGNOSIS — G5781 Other specified mononeuropathies of right lower limb: Secondary | ICD-10-CM | POA: Diagnosis not present

## 2023-10-11 DIAGNOSIS — Z7984 Long term (current) use of oral hypoglycemic drugs: Secondary | ICD-10-CM | POA: Diagnosis not present

## 2023-10-11 DIAGNOSIS — M48062 Spinal stenosis, lumbar region with neurogenic claudication: Secondary | ICD-10-CM | POA: Insufficient documentation

## 2023-10-11 DIAGNOSIS — M961 Postlaminectomy syndrome, not elsewhere classified: Secondary | ICD-10-CM | POA: Insufficient documentation

## 2023-10-11 DIAGNOSIS — M898X6 Other specified disorders of bone, lower leg: Secondary | ICD-10-CM | POA: Diagnosis not present

## 2023-10-11 DIAGNOSIS — E1140 Type 2 diabetes mellitus with diabetic neuropathy, unspecified: Secondary | ICD-10-CM | POA: Diagnosis not present

## 2023-10-11 DIAGNOSIS — Z981 Arthrodesis status: Secondary | ICD-10-CM | POA: Insufficient documentation

## 2023-10-11 DIAGNOSIS — Z9689 Presence of other specified functional implants: Secondary | ICD-10-CM | POA: Diagnosis not present

## 2023-10-11 DIAGNOSIS — M7918 Myalgia, other site: Secondary | ICD-10-CM | POA: Insufficient documentation

## 2023-10-11 NOTE — Progress Notes (Signed)
 PROVIDER NOTE: Interpretation of information contained herein should be left to medically-trained personnel. Specific patient instructions are provided elsewhere under Patient Instructions section of medical record. This document was created in part using AI and STT-dictation technology, any transcriptional errors that may result from this process are unintentional.  Patient: Sean Reyes.  Service: E/M   PCP: Valerio Melanie DASEN, NP  DOB: [birth year]  DOS: 10/11/2023  Provider: Emmy MARLA Blanch, NP  MRN: [id number]  Delivery: Face-to-face  Specialty: Interventional Pain Management  Type: Established Patient  Setting: Ambulatory outpatient facility  Specialty designation: 09  Referring Prov.: Valerio Melanie DASEN, NP  Location: Outpatient office facility       History of present illness (HPI) Mr. Shahzain Kiester., a 83 y.o. 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 Mr. Duerst primary complain today is Leg Pain (Front of leg )  Pertinent problems: Mr. Primeau does not have any pertinent problems on file.  Pain Assessment: Severity of Chronic pain is reported as a 6 /10. Location: Leg Right/Denies. Onset: More than a month ago. Quality: Stabbing. Timing: Intermittent. Modifying factor(s): Nothing. Vitals:  height is 6' 1 (1.854 m) and weight is 187 lb (84.8 kg). His temporal temperature is 98.1 F (36.7 C). His blood pressure is 129/73 and his pulse is 72. His respiration is 20 and oxygen saturation is 100%.  BMI: Estimated body mass index is 24.67 kg/m as calculated from the following:   Height as of this encounter: 6' 1 (1.854 m).   Weight as of this encounter: 187 lb (84.8 kg).  Last encounter: 08/28/2023 Last procedure: 09/17/2023  Reason for encounter: post-procedure evaluation and assessment.   Mr. Anctil received a Right medial tibial Trigger Point Injection (Myoneural Block) on September 17, 2023. He reports that it does not provide any pain relief  since the procedure.  Procedure Type:  Right medial tibial Trigger Point Injection (Myoneural Block) (1-2 muscle groups)  #1 (w/ steroids)  CPT: 20552 Laterality: Right (-RT)    Imaging: N/A. Landmark-guided,           Anesthesia: Local anesthesia (1-2% Lidocaine ) DOS: 09/17/2023  Performed by: Wallie Sherry, MD  Post-Procedure Evaluation    Effectiveness:  Initial hour after procedure: 0 % . Subsequent 4-6 hours post-procedure: 0 % . Analgesia past initial 6 hours: 0 % . Ongoing improvement:  Analgesic:  Mr. Chimento received a Right medial tibial Trigger Point Injection (Myoneural Block) on September 17, 2023. He reports that it does not provide any pain relief since the procedure.  Function: No improvement ROM: No improvement    Pharmacotherapy Assessment   Monitoring: [place] PMP: PDMP reviewed during this encounter.       Pharmacotherapy: No side-effects or adverse reactions reported. Compliance: No problems identified. Effectiveness: Clinically acceptable.  No notes on file  UDS:  No results found for: SUMMARY  No results found for: CBDTHCR No results found for: D8THCCBX No results found for: D9THCCBX  ROS  Constitutional: Denies any fever or chills Gastrointestinal: No reported hemesis, hematochezia, vomiting, or acute GI distress Musculoskeletal: Denies any acute onset joint swelling, redness, loss of ROM, or weakness Neurological: No reported episodes of acute onset apraxia, aphasia, dysarthria, agnosia, amnesia, paralysis, loss of coordination, or loss of consciousness  Medication Review  EPINEPHrine , amLODipine , apixaban , benazepril , cyanocobalamin , ferrous sulfate, finasteride , metFORMIN , omeprazole , prednisoLONE acetate, rosuvastatin , solifenacin , tamsulosin , and umeclidinium-vilanterol  History Review  Allergy: Mr. Owensby is allergic to levaquin [levofloxacin in d5w], shellfish allergy,  amiodarone, and adhesive [tape]. Drug: Mr. Damaso  reports no history  of drug use. Alcohol:  reports no history of alcohol use. Tobacco:  reports that he has never smoked. He has never used smokeless tobacco. Social: Mr. Goethe  reports that he has never smoked. He has never used smokeless tobacco. He reports that he does not drink alcohol and does not use drugs. Medical:  has a past medical history of Anemia, Anxiety, Arthritis, Arthritis of neck, Atrial fibrillation (HCC), Cataracts, bilateral, Complication of anesthesia, Depression, Diabetes (HCC), Dysrhythmia, GERD (gastroesophageal reflux disease), History of kidney stones, HOH (hard of hearing), Hyperlipidemia, Hypertension, Myocardial infarction (HCC) (12/2020), Nocturia, S/P ablation of atrial fibrillation, Sleep apnea, Spinal stenosis, and Tachycardia, unspecified. Surgical: Mr. Stroder  has a past surgical history that includes Tonsillectomy; Hernia repair; Ablation; Colonoscopy with propofol  (N/A, 10/05/2015); Lumbar laminectomy/decompression microdiscectomy (Left, 09/13/2016); Anterior lat lumbar fusion (N/A, 06/27/2017); Joint replacement (Bilateral); Back surgery; Esophagogastroduodenoscopy (egd) with propofol  (N/A, 04/01/2018); Cardioversion (N/A, 08/29/2018); Cardioversion (N/A, 09/24/2018); Spinal cord stimulator insertion (07/08/2019); Colonoscopy with propofol  (N/A, 11/01/2020); Esophagogastroduodenoscopy (N/A, 11/01/2020); LEFT HEART CATH AND CORONARY ANGIOGRAPHY (N/A, 12/22/2020); CORONARY STENT INTERVENTION (N/A, 12/22/2020); LEFT HEART CATH AND CORONARY ANGIOGRAPHY (Left, 08/23/2021); Cardiac catheterization; Eye surgery; and Esophagogastroduodenoscopy (egd) with propofol  (N/A, 06/01/2022). Family: family history includes Brain cancer in his mother; Other in his father.  Laboratory Chemistry Profile   Renal Lab Results  Component Value Date   BUN 21 10/05/2023   CREATININE 1.13 10/05/2023   BCR 19 10/05/2023   GFRAA 64 02/26/2020   GFRNONAA 53 (L) 04/13/2023    Hepatic Lab Results  Component  Value Date   AST 17 10/05/2023   ALT 13 10/05/2023   ALBUMIN 4.2 10/05/2023   ALKPHOS 66 10/05/2023   LIPASE 29 05/25/2022    Electrolytes Lab Results  Component Value Date   NA 137 10/05/2023   K 4.8 10/05/2023   CL 101 10/05/2023   CALCIUM  9.5 10/05/2023   MG 1.6 01/02/2022    Bone Lab Results  Component Value Date   VD25OH 39.8 04/25/2021   TESTOFREE 5.6 (L) 06/17/2021   TESTOSTERONE  400 06/17/2021    Inflammation (CRP: Acute Phase) (ESR: Chronic Phase) No results found for: CRP, ESRSEDRATE, LATICACIDVEN       Note: Above Lab results reviewed.  Recent Imaging Review  NM PET CT CARDIAC PERFUSION MULTI W/ABSOLUTE BLOODFLOW   The study is normal. The study is low risk.   LV perfusion is normal. There is no evidence of ischemia. There is no  evidence of infarction.   Rest left ventricular function is normal. Rest EF: 62%. Stress left  ventricular function is normal. Stress EF: 69%. End diastolic cavity size  is normal. End systolic cavity size is normal. No evidence of transient  ischemic dilation (TID) noted.   Myocardial blood flow was computed to be 1.23ml/g/min at rest and  2.10ml/g/min at stress. Global myocardial blood flow reserve was 1.75 and  was normal due to high resting flows which artificially lower the CFR.  With stress flows >2, findings are likely normal.   Coronary calcium  assessment not performed due to prior  revascularization.   Electronically signed by: Soyla DELENA Merck, MD  EXAM: OVER-READ INTERPRETATION CARDIAC CT CHEST  The following report is an over-read performed by radiologist Dr. Ryan Salvage of [facility] on 07/05/2023. This over-read does not include interpretation of cardiac or coronary anatomy or pathology. The cardiac and PET interpretation by the cardiologist is attached or will be attached.  COMPARISON:  11/01/2021  FINDINGS: Extracardiac Vascular: Atherosclerotic thoracic aorta.  Partial anomalous  pulmonary venous return is observed, with left upper lobe pulmonary vessels draining to a left-sided mediastinal vein which extends into the brachiocephalic vein.  Mediastinum: Unremarkable  Lung: Stable 5 by 4 mm right lower lobe nodule along the major fissure, image 22 series 4, no change from 11/11/2021. This is a benign subpleural lymph node and warrants no further imaging workup.  Scarring in the posterior basal segment left lower lobe.  Included Upper Abdomen: Abdominal aortic atherosclerosis.  Musculoskeletal: Thoracic spondylosis. Grade 1 degenerative retrolisthesis at T11-12. Dorsal column stimulator noted with electrodes less well seen compared to the 2023 exam, but extending as cephalad as the T7-8 level.  IMPRESSION: 1. 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 2. Thoracic spondylosis. 3. Dorsal column stimulator. 4.  Aortic Atherosclerosis (ICD10-I70.0).  Electronically Signed   By: Ryan Salvage M.D.   On: 07/05/2023 13:53 Note: Reviewed        Physical Exam  Vitals: BP 129/73 (BP Location: Right Arm, Patient Position: Sitting)   Pulse 72   Temp 98.1 F (36.7 C) (Temporal)   Resp 20   Ht 6' 1 (1.854 m)   Wt 187 lb (84.8 kg)   SpO2 100%   BMI 24.67 kg/m  BMI: Estimated body mass index is 24.67 kg/m as calculated from the following:   Height as of this encounter: 6' 1 (1.854 m).   Weight as of this encounter: 187 lb (84.8 kg). Ideal: Ideal body weight: 79.9 kg (176 lb 2.4 oz) Adjusted ideal body weight: 81.9 kg (180 lb 7.8 oz) General appearance: Well nourished, well developed, and well hydrated. In no apparent acute distress Mental status: Alert, oriented x 3 (person, place, & time)       Respiratory: No evidence of acute respiratory distress Eyes: PERLA   Assessment   Diagnosis Status  1. Saphenous nerve neuropathy, right   2. Pain of right tibia   3. Myofascial pain   4. Failed back surgical syndrome   5. History of  fusion of lumbar spine (L2-L5)   6. Spinal stenosis, lumbar region, with neurogenic claudication   7. Spinal cord stimulator status   8. Chronic radicular lumbar pain   9. Chronic painful diabetic neuropathy (HCC)    Controlled Controlled Controlled   Updated Problems: No problems updated.  Plan of Care  Problem-specific:  Assessment and Plan As per discussion with Dr. Marcelino the patient is not a candidate for Femoral nerve block as the pain is just localized on right tibia. No other issues or problem he have it at this time.  Mr. Jariel Drost. has a current medication list which includes the following long-term medication(s): amlodipine , benazepril , eliquis , metformin , rosuvastatin , and omeprazole .  Pharmacotherapy (Medications Ordered): No orders of the defined types were placed in this encounter.  Orders:  No orders of the defined types were placed in this encounter.       No follow-ups on file.    Recent Visits Date Type Provider Dept  09/17/23 Procedure visit Marcelino Nurse, MD [facility]  08/28/23 Office Visit Marcelino Nurse, MD [facility]  Showing recent visits within past 90 days and meeting all other requirements Today's Visits Date Type Provider Dept  10/11/23 Office Visit Brodie Correll K, NP [facility]  Showing today's visits and meeting all other requirements Future Appointments No visits were found meeting these conditions. Showing future appointments within next 90 days and  meeting all other requirements  I discussed the assessment and treatment plan with the patient. The patient was provided an opportunity to ask questions and all were answered. The patient agreed with the plan and demonstrated an understanding of the instructions.  Patient advised to call back or seek an in-person evaluation if the symptoms or condition worsens.  Duration of encounter: 20 minutes.  Total time on encounter, as per AMA guidelines included  both the face-to-face and non-face-to-face time personally spent by the physician and/or other qualified health care professional(s) on the day of the encounter (includes time in activities that require the physician or other qualified health care professional and does not include time in activities normally performed by clinical staff). Physician's time may include the following activities when performed: Preparing to see the patient (e.g., pre-charting review of records, searching for previously ordered imaging, lab work, and nerve conduction tests) Review of prior analgesic pharmacotherapies. Reviewing PMP Interpreting ordered tests (e.g., lab work, imaging, nerve conduction tests) Performing post-procedure evaluations, including interpretation of diagnostic procedures Obtaining and/or reviewing separately obtained history Performing a medically appropriate examination and/or evaluation Counseling and educating the patient/family/caregiver Ordering medications, tests, or procedures Referring and communicating with other health care professionals (when not separately reported) Documenting clinical information in the electronic or other health record Independently interpreting results (not separately reported) and communicating results to the patient/ family/caregiver Care coordination (not separately reported)  Note by: Lizet Kelso K Saahas Hidrogo, NP (TTS and AI technology used. I apologize for any typographical errors that were not detected and corrected.) Date: 10/11/2023; Time: 2:04 PM

## 2023-10-17 ENCOUNTER — Encounter: Payer: Self-pay | Admitting: Internal Medicine

## 2023-10-17 ENCOUNTER — Inpatient Hospital Stay

## 2023-10-17 ENCOUNTER — Inpatient Hospital Stay: Attending: Internal Medicine | Admitting: Internal Medicine

## 2023-10-17 VITALS — BP 126/64 | HR 68 | Temp 98.1°F | Resp 20 | Ht 73.0 in | Wt 186.9 lb

## 2023-10-17 VITALS — BP 126/63 | HR 59

## 2023-10-17 DIAGNOSIS — D509 Iron deficiency anemia, unspecified: Secondary | ICD-10-CM | POA: Insufficient documentation

## 2023-10-17 DIAGNOSIS — D649 Anemia, unspecified: Secondary | ICD-10-CM

## 2023-10-17 LAB — CBC WITH DIFFERENTIAL (CANCER CENTER ONLY)
Abs Immature Granulocytes: 0.02 K/uL (ref 0.00–0.07)
Basophils Absolute: 0 K/uL (ref 0.0–0.1)
Basophils Relative: 0 %
Eosinophils Absolute: 0 K/uL (ref 0.0–0.5)
Eosinophils Relative: 0 %
HCT: 32.1 % — ABNORMAL LOW (ref 39.0–52.0)
Hemoglobin: 10.9 g/dL — ABNORMAL LOW (ref 13.0–17.0)
Immature Granulocytes: 0 %
Lymphocytes Relative: 21 %
Lymphs Abs: 1.6 K/uL (ref 0.7–4.0)
MCH: 31.4 pg (ref 26.0–34.0)
MCHC: 34 g/dL (ref 30.0–36.0)
MCV: 92.5 fL (ref 80.0–100.0)
Monocytes Absolute: 0.9 K/uL (ref 0.1–1.0)
Monocytes Relative: 12 %
Neutro Abs: 4.9 K/uL (ref 1.7–7.7)
Neutrophils Relative %: 67 %
Platelet Count: 139 K/uL — ABNORMAL LOW (ref 150–400)
RBC: 3.47 MIL/uL — ABNORMAL LOW (ref 4.22–5.81)
RDW: 13.1 % (ref 11.5–15.5)
WBC Count: 7.4 K/uL (ref 4.0–10.5)
nRBC: 0 % (ref 0.0–0.2)

## 2023-10-17 LAB — CMP (CANCER CENTER ONLY)
ALT: 13 U/L (ref 0–44)
AST: 20 U/L (ref 15–41)
Albumin: 3.8 g/dL (ref 3.5–5.0)
Alkaline Phosphatase: 54 U/L (ref 38–126)
Anion gap: 9 (ref 5–15)
BUN: 27 mg/dL — ABNORMAL HIGH (ref 8–23)
CO2: 23 mmol/L (ref 22–32)
Calcium: 9.3 mg/dL (ref 8.9–10.3)
Chloride: 100 mmol/L (ref 98–111)
Creatinine: 1.22 mg/dL (ref 0.61–1.24)
GFR, Estimated: 60 mL/min — ABNORMAL LOW (ref >60)
Glucose, Bld: 225 mg/dL — ABNORMAL HIGH (ref 70–99)
Potassium: 4.4 mmol/L (ref 3.5–5.1)
Sodium: 132 mmol/L — ABNORMAL LOW (ref 135–145)
Total Bilirubin: 0.6 mg/dL (ref 0.0–1.2)
Total Protein: 6.8 g/dL (ref 6.5–8.1)

## 2023-10-17 LAB — IRON AND TIBC
Iron: 61 ug/dL (ref 45–182)
Saturation Ratios: 19 % (ref 17.9–39.5)
TIBC: 316 ug/dL (ref 250–450)
UIBC: 255 ug/dL

## 2023-10-17 LAB — FERRITIN: Ferritin: 135 ng/mL (ref 24–336)

## 2023-10-17 MED ORDER — SODIUM CHLORIDE 0.9% FLUSH
10.0000 mL | Freq: Once | INTRAVENOUS | Status: AC | PRN
  Administered 2023-10-17: 10 mL
  Filled 2023-10-17: qty 10

## 2023-10-17 MED ORDER — IRON SUCROSE 20 MG/ML IV SOLN
200.0000 mg | Freq: Once | INTRAVENOUS | Status: AC
  Administered 2023-10-17: 200 mg via INTRAVENOUS
  Filled 2023-10-17: qty 10

## 2023-10-17 NOTE — Progress Notes (Signed)
 Fatigue/weakness: YES/WORSE Dyspena:YES Light headedness: YES-mostly when BP drops to 106/40s Blood in stool: NO

## 2023-10-17 NOTE — Assessment & Plan Note (Addendum)
 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 S/p IV venofer  weekly x4.; on slow release PO iron .   # Hb today- 10.9- proceed with evnfoer today. Consider/ordered retacrit in future.   # Mild thrombocytopenia platelets 145.  Monitor for now.   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

## 2023-10-17 NOTE — Progress Notes (Signed)
 [facility]  CONSULT NOTE  Patient Care Team: Valerio Melanie DASEN, NP as PCP - General (Nurse Practitioner) Darron Deatrice LABOR, MD as PCP - Cardiology (Cardiology) Hooten, Lynwood SQUIBB, MD (Orthopedic Surgery) Claudene Norleen MOULD, MD as Referring Physician (Urology) Rennie Cindy SAUNDERS, MD as Consulting Physician (Oncology) Clois Fret, MD as Consulting Physician (Neurosurgery) Marcelino Nurse, MD as Consulting Physician (Pain Medicine)  CHIEF COMPLAINTS/PURPOSE OF CONSULTATION: ANEMIA   HEMATOLOGY HISTORY: ;  #Chronic anemia hemoglobin 10-11; October 22 -iron  saturation 17% ferritin 90s./? CKD-S/p Venofer  x4; Jan 2023- Hb 12.3;  EGD/Colonoscopy: Sep, 2022- Dr.Anna  # OCT 2022-non-STEMI- [s/p stenting]aspirin  Brilinta ; paroxysmal A. fib on Eliquis  [Dr. Paraschoes/Dr.Kowalski--> Dr. Arida];CKD stage III; chronic back pain-s/p back surgeries s/p spinal cord-pain stimulator; diabetes on oral medication; 2022-C. difficile colitis-s/p oral vancomycin .   HISTORY OF PRESENTING ILLNESS: Ambulating cane/crutches; alone.  Sean Reyes. 83 y.o.  male iron  deficient anemia/?  CKD stage III with Hx of COPD is here for follow-up.   Patient complaining of worsening dyspnea. Recently ran out of inhalers.   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 Back pain is a 6/10 when walking. No blood in stool. Appetite is good. Patient currently on oral iron /slow release.  Tolerating well.  No constipation.    Review of Systems  Constitutional:  Positive for malaise/fatigue. Negative for chills, diaphoresis, fever and weight loss.  HENT:  Negative for nosebleeds and sore throat.   Eyes:  Negative for double vision.  Respiratory:  Positive for shortness of breath. Negative for cough, hemoptysis, sputum production and wheezing.   Cardiovascular:  Negative for chest pain, palpitations, orthopnea and leg swelling.  Gastrointestinal:  Negative for abdominal pain, blood in stool, constipation,  diarrhea, heartburn, melena, nausea and vomiting.  Genitourinary:  Negative for dysuria, frequency and urgency.  Musculoskeletal:  Positive for back pain and joint pain.  Skin: Negative.  Negative for itching and rash.  Neurological:  Negative for dizziness, tingling, focal weakness, weakness and headaches.  Endo/Heme/Allergies:  Does not bruise/bleed easily.  Psychiatric/Behavioral:  Negative for depression. The patient is not nervous/anxious and does not have insomnia.     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   Hyperlipidemia    Hypertension    Myocardial infarction (HCC) 12/2020   Nocturia    S/P ablation of atrial fibrillation    Ablative therapy   Sleep apnea    CPAP    Spinal stenosis    Tachycardia, unspecified     SURGICAL HISTORY: Past Surgical History:  Procedure Laterality Date   ABLATION     ANTERIOR LAT LUMBAR FUSION N/A 06/27/2017   Procedure: Anterior Lateral Lumbar Interbody  Fusion - Lumbar Two-Lumbar Three - Lumbar Three-Lumbar Four, Posterior Lumbar Interbody Fusion Lumbar Four- Five;  Surgeon: Onetha Kuba, MD;  Location: [facility] OR;  Service: Neurosurgery;  Laterality: N/A;  Anterior  Lateral Lumbar Interbody  Fusion - Lumbar Two-Lumbar Three - Lumbar Three-Lumbar Four, Posterior Lumbar Interbody Fusion Lumbar Four- Five   BACK SURGERY     CARDIAC CATHETERIZATION     CARDIOVERSION N/A 08/29/2018   Procedure: CARDIOVERSION;  Surgeon: Hester Wolm PARAS, MD;  Location: [facility] ORS;  Service:  Cardiovascular;  Laterality: N/A;   CARDIOVERSION N/A 09/24/2018   Procedure: CARDIOVERSION;  Surgeon: Hester Wolm PARAS, MD;  Location: [facility] ORS;  Service: Cardiovascular;  Laterality: N/A;   COLONOSCOPY WITH PROPOFOL  N/A 10/05/2015   Procedure: COLONOSCOPY WITH PROPOFOL ;  Surgeon: Gladis RAYMOND Mariner, MD;  Location: [facility] ENDOSCOPY;  Service: Endoscopy;  Laterality: N/A;   COLONOSCOPY WITH PROPOFOL  N/A 11/01/2020   Procedure: COLONOSCOPY WITH PROPOFOL ;  Surgeon: Therisa Bi, MD;  Location: [facility] ENDOSCOPY;  Service: Gastroenterology;  Laterality: N/A;   CORONARY STENT INTERVENTION N/A 12/22/2020   Procedure: CORONARY STENT INTERVENTION;  Surgeon: Ammon Blunt, MD;  Location: [facility] INVASIVE CV LAB;  Service: Cardiovascular;  Laterality: N/A;   ESOPHAGOGASTRODUODENOSCOPY N/A 11/01/2020   Procedure: ESOPHAGOGASTRODUODENOSCOPY (EGD);  Surgeon: Therisa Bi, MD;  Location: [facility] ENDOSCOPY;  Service: Gastroenterology;  Laterality: N/A;   ESOPHAGOGASTRODUODENOSCOPY (EGD) WITH PROPOFOL  N/A 04/01/2018   Procedure: ESOPHAGOGASTRODUODENOSCOPY (EGD) WITH PROPOFOL ;  Surgeon: Mariner Gladis RAYMOND, MD;  Location: [facility] ENDOSCOPY;  Service: Endoscopy;  Laterality: N/A;   ESOPHAGOGASTRODUODENOSCOPY (EGD) WITH PROPOFOL  N/A 06/01/2022   Procedure: ESOPHAGOGASTRODUODENOSCOPY (EGD) WITH PROPOFOL ;  Surgeon: Therisa Bi, MD;  Location: [facility] ENDOSCOPY;  Service: Gastroenterology;  Laterality: N/A;   EYE SURGERY     HERNIA REPAIR     JOINT REPLACEMENT Bilateral    hips  RT+  LEFT X2    LEFT HEART CATH AND CORONARY ANGIOGRAPHY N/A 12/22/2020   Procedure: LEFT HEART CATH AND CORONARY ANGIOGRAPHY;  Surgeon: Ammon Blunt, MD;  Location: [facility] INVASIVE CV LAB;  Service: Cardiovascular;  Laterality: N/A;   LEFT HEART CATH AND CORONARY ANGIOGRAPHY Left 08/23/2021   Procedure: LEFT HEART CATH AND CORONARY ANGIOGRAPHY;  Surgeon: Hester Wolm PARAS, MD;  Location: [facility] INVASIVE CV LAB;  Service: Cardiovascular;  Laterality:  Left;   LUMBAR LAMINECTOMY/DECOMPRESSION MICRODISCECTOMY Left 09/13/2016   Procedure: Microdiscectomy - Lumbar two-three,  Lumbar three- - left;  Surgeon: Onetha Kuba, MD;  Location: [facility] OR;  Service: Neurosurgery;  Laterality: Left;   SPINAL CORD STIMULATOR INSERTION  07/08/2019   TONSILLECTOMY      SOCIAL HISTORY: Social History   Socioeconomic History   Marital status: Married    Spouse name: Channing    Number of children: 2   Years of education: Not on file   Highest education level: High school graduate  Occupational History   Occupation: retired   Tobacco Use   Smoking status: Never   Smokeless tobacco: Never  Vaping Use   Vaping status: Never Used  Substance and Sexual Activity   Alcohol use: No   Drug use: No   Sexual activity: Not on file  Other Topics Concern   Not on file  Social History Narrative   MarriedGets regular exercise.      Lives in [place] with wife/ daughter. Never smoked; no alcohol. 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    Social Drivers of Corporate investment banker Strain: Low Risk  (09/19/2022)   Overall Financial Resource Strain (CARDIA)    Difficulty of Paying Living Expenses: Not hard at all  Food Insecurity: No Food Insecurity (09/19/2022)   Hunger Vital Sign    Worried About Running Out of Food  in the Last Year: Never true    Ran Out of Food in the Last Year: Never true  Transportation Needs: No Transportation Needs (09/19/2022)   PRAPARE - Administrator, Civil Service (Medical): No    Lack of Transportation (Non-Medical): No  Physical Activity: Insufficiently Active (11/23/2022)   Exercise Vital Sign    Days of Exercise per Week: 2 days    Minutes of Exercise per Session: 30 min  Stress: No Stress Concern Present (09/19/2022)   [employer] of Occupational Health - Occupational Stress Questionnaire    Feeling of Stress : Not at all  Social Connections: Socially Integrated  (09/19/2022)   Social Connection and Isolation Panel    Frequency of Communication with Friends and Family: More than three times a week    Frequency of Social Gatherings with Friends and Family: Twice a week    Attends Religious Services: More than 4 times per year    Active Member of [employer] or Organizations: Yes    Attends Engineer, structural: More than 4 times per year    Marital Status: Married  Catering manager Violence: Not At Risk (09/19/2022)   Humiliation, Afraid, Rape, and Kick questionnaire    Fear of Current or Ex-Partner: No    Emotionally Abused: No    Physically Abused: No    Sexually Abused: No    FAMILY HISTORY: Family History  Problem Relation Age of Onset   Brain cancer Mother    Other Father        blood clots in his lungs   Kidney disease Neg Hx    Prostate cancer Neg Hx    Kidney cancer Neg Hx    Bladder Cancer Neg Hx     ALLERGIES:  is allergic to levaquin [levofloxacin in d5w], shellfish allergy, amiodarone, and adhesive [tape].  MEDICATIONS:  Current Outpatient Medications  Medication Sig Dispense Refill   amLODipine  (NORVASC ) 5 MG tablet Take 1.5 tablets (7.5 mg total) by mouth at bedtime. (Patient taking differently: Take 5 mg by mouth at bedtime.) 135 tablet 3   benazepril  (LOTENSIN ) 20 MG tablet Take 1.5 tablets (30 mg total) by mouth daily. (Patient taking differently: Take 20 mg by mouth daily.) 135 tablet 1   cyanocobalamin  1000 MCG tablet Take 1,000 mcg by mouth daily.     ELIQUIS  5 MG TABS tablet Take 1 tablet (5 mg total) by mouth 2 (two) times daily. 180 tablet 3   EPINEPHrine  0.3 mg/0.3 mL IJ SOAJ injection Inject 0.3 mg into the muscle as needed for anaphylaxis. 1 each 0   ferrous sulfate 325 (65 FE) MG EC tablet Take 325 mg by mouth daily with breakfast.     finasteride  (PROSCAR ) 5 MG tablet Take 1 tablet (5 mg total) by mouth daily. 90 tablet 3   metFORMIN  (GLUCOPHAGE ) 500 MG tablet Take 1 tablet (500 mg total) by mouth 2 (two)  times daily with a meal. 180 tablet 4   pantoprazole  (PROTONIX ) 40 MG tablet Take 40 mg by mouth daily.     prednisoLONE acetate (PRED FORTE) 1 % ophthalmic suspension Place 1 drop into both eyes daily.     rosuvastatin  (CRESTOR ) 5 MG tablet Take 1 tablet (5 mg total) by mouth daily. 90 tablet 4   solifenacin  (VESICARE ) 5 MG tablet Take 1 tablet (5 mg total) by mouth daily. 90 tablet 3   tamsulosin  (FLOMAX ) 0.4 MG CAPS capsule Take 2 capsules (0.8 mg total) by mouth daily after supper. 180  capsule 3   umeclidinium-vilanterol (ANORO ELLIPTA ) 62.5-25 MCG/ACT AEPB Inhale 1 puff into the lungs daily. 90 each 1   No current facility-administered medications for this visit.   Facility-Administered Medications Ordered in Other Visits  Medication Dose Route Frequency Provider Last Rate Last Admin   iron  sucrose (VENOFER ) injection 200 mg  200 mg Intravenous Once Patsye Sullivant R, MD       sodium chloride  flush (NS) 0.9 % injection 10 mL  10 mL Intracatheter Once PRN Mckynzie Liwanag R, MD          PHYSICAL EXAMINATION:   Vitals:   10/17/23 1254  BP: 126/64  Pulse: 68  Resp: 20  Temp: 98.1 F (36.7 C)  SpO2: 99%     Filed Weights   10/17/23 1254  Weight: 186 lb 14.4 oz (84.8 kg)      Physical Exam Vitals and nursing note reviewed.  HENT:     Head: Normocephalic and atraumatic.     Mouth/Throat:     Pharynx: Oropharynx is clear.  Eyes:     Extraocular Movements: Extraocular movements intact.     Pupils: Pupils are equal, round, and reactive to light.  Cardiovascular:     Rate and Rhythm: Normal rate and regular rhythm.  Pulmonary:     Comments: Decreased breath sounds bilaterally.  Abdominal:     Palpations: Abdomen is soft.  Musculoskeletal:        General: Normal range of motion.     Cervical back: Normal range of motion.  Skin:    General: Skin is warm.  Neurological:     General: No focal deficit present.     Mental Status: He is alert and oriented to  person, place, and time.  Psychiatric:        Behavior: Behavior normal.        Judgment: Judgment normal.     LABORATORY DATA:  I have reviewed the data as listed Lab Results  Component Value Date   WBC 7.4 10/17/2023   HGB 10.9 (L) 10/17/2023   HCT 32.1 (L) 10/17/2023   MCV 92.5 10/17/2023   PLT 139 (L) 10/17/2023   Recent Labs    04/13/23 1319 07/04/23 1334 10/05/23 1540 10/17/23 1249  NA 132* 135 137 132*  K 4.2 4.6 4.8 4.4  CL 97* 98 101 100  CO2 25 23 20 23   GLUCOSE 187* 242* 128* 225*  BUN 23 21 21  27*  CREATININE 1.36* 1.32* 1.13 1.22  CALCIUM  9.3 9.4 9.5 9.3  GFRNONAA 53*  --   --  60*  PROT 7.5 6.4 6.7 6.8  ALBUMIN 3.9 4.0 4.2 3.8  AST 18 15 17 20   ALT 15 15 13 13   ALKPHOS 58 73 66 54  BILITOT 0.6 0.4 0.4 0.6     No results found.  Symptomatic anemia Mild to moderate symptomatic anemia Hb 11-12;/Likely CKD-III. S/p IV venofer  weekly x4.; on slow release PO iron .   # Hb today- 10.9- proceed with evnfoer today. Consider/ordered retacrit in future.   # Mild thrombocytopenia platelets 145.  Monitor for now.   # CKD stage III [GFR 50s]- stable  # COPD/ CAD [sp stenting OCT 2022; Dr.Arida]; on Eliquis /antiplatelet therapy.stable  # DISPOSITION:  # proceed with  venofer - # in 6 months- MD; labs- cbc/cmp; iron  studies/feritin-;possible venofer - Dr.B  All questions were answered. The patient knows to call the clinic with any problems, questions or concerns.    Cindy JONELLE Joe, MD 10/17/2023 1:42 PM

## 2023-10-29 DIAGNOSIS — H04413 Chronic dacryocystitis of bilateral lacrimal passages: Secondary | ICD-10-CM | POA: Diagnosis not present

## 2023-10-29 DIAGNOSIS — Z961 Presence of intraocular lens: Secondary | ICD-10-CM | POA: Diagnosis not present

## 2023-10-29 DIAGNOSIS — E119 Type 2 diabetes mellitus without complications: Secondary | ICD-10-CM | POA: Diagnosis not present

## 2023-10-29 LAB — HM DIABETES EYE EXAM

## 2023-11-23 ENCOUNTER — Other Ambulatory Visit: Payer: Self-pay | Admitting: Cardiology

## 2023-12-04 ENCOUNTER — Ambulatory Visit: Attending: Neurology

## 2023-12-04 DIAGNOSIS — R262 Difficulty in walking, not elsewhere classified: Secondary | ICD-10-CM | POA: Insufficient documentation

## 2023-12-04 DIAGNOSIS — R2681 Unsteadiness on feet: Secondary | ICD-10-CM | POA: Diagnosis not present

## 2023-12-04 DIAGNOSIS — M5459 Other low back pain: Secondary | ICD-10-CM | POA: Diagnosis not present

## 2023-12-04 DIAGNOSIS — M6281 Muscle weakness (generalized): Secondary | ICD-10-CM | POA: Insufficient documentation

## 2023-12-04 NOTE — Therapy (Addendum)
 OUTPATIENT PHYSICAL THERAPY BALANCE TREATMENT NOTE   Patient Name: Sean Reyes. MRN: [id number] DOB:1940-06-22, 83 y.o., male Today's Date: 12/04/2023  END OF SESSION:  PT End of Session - 12/04/23 1309     Visit Number 28    Number of Visits 73    Date for Recertification  01/26/24    Authorization Type eval: 05/29/23;    Authorization Time Period Medicare A&B 2025  VL: Based on [place]  No auth req    PT Start Time 1115    PT Stop Time 1200    PT Time Calculation (min) 45 min    Equipment Utilized During Treatment Gait belt    Activity Tolerance Patient tolerated treatment well;Patient limited by fatigue;Patient limited by pain    Behavior During Therapy WFL for tasks assessed/performed         Past Medical History:  Diagnosis Date   Anemia    Anxiety    Arthritis    Arthritis of neck    Atrial fibrillation (HCC)    Cataracts, bilateral    Complication of anesthesia    pt reports low BP's after surgery at [facility] and difficulty awakening   Depression    Diabetes (HCC)    dx 6-8 yrs ago   Dysrhythmia    a-fib   GERD (gastroesophageal reflux disease)    OCC TAKES ALKA SELTZER   History of kidney stones    10-15 yrs ago   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   Hyperlipidemia    Hypertension    Myocardial infarction (HCC) 12/2020   Nocturia    S/P ablation of atrial fibrillation    Ablative therapy   Sleep apnea    CPAP    Spinal stenosis    Tachycardia, unspecified    Past Surgical History:  Procedure Laterality Date   ABLATION     ANTERIOR LAT LUMBAR FUSION N/A 06/27/2017   Procedure: Anterior Lateral Lumbar Interbody  Fusion - Lumbar Two-Lumbar Three - Lumbar Three-Lumbar Four, Posterior Lumbar Interbody Fusion Lumbar Four- Five;  Surgeon: Onetha Kuba, MD;  Location: [facility] OR;  Service: Neurosurgery;  Laterality: N/A;  Anterior Lateral Lumbar Interbody  Fusion - Lumbar Two-Lumbar Three - Lumbar Three-Lumbar Four, Posterior Lumbar Interbody  Fusion Lumbar Four- Five   BACK SURGERY     CARDIAC CATHETERIZATION     CARDIOVERSION N/A 08/29/2018   Procedure: CARDIOVERSION;  Surgeon: Hester Wolm PARAS, MD;  Location: [facility] ORS;  Service: Cardiovascular;  Laterality: N/A;   CARDIOVERSION N/A 09/24/2018   Procedure: CARDIOVERSION;  Surgeon: Hester Wolm PARAS, MD;  Location: [facility] ORS;  Service: Cardiovascular;  Laterality: N/A;   COLONOSCOPY WITH PROPOFOL  N/A 10/05/2015   Procedure: COLONOSCOPY WITH PROPOFOL ;  Surgeon: Gladis RAYMOND Mariner, MD;  Location: [facility] ENDOSCOPY;  Service: Endoscopy;  Laterality: N/A;   COLONOSCOPY WITH PROPOFOL  N/A 11/01/2020   Procedure: COLONOSCOPY WITH PROPOFOL ;  Surgeon: Therisa Bi, MD;  Location: [facility] ENDOSCOPY;  Service: Gastroenterology;  Laterality: N/A;   CORONARY STENT INTERVENTION N/A 12/22/2020   Procedure: CORONARY STENT INTERVENTION;  Surgeon: Ammon Blunt, MD;  Location: [facility] INVASIVE CV LAB;  Service: Cardiovascular;  Laterality: N/A;   ESOPHAGOGASTRODUODENOSCOPY N/A 11/01/2020   Procedure: ESOPHAGOGASTRODUODENOSCOPY (EGD);  Surgeon: Therisa Bi, MD;  Location: [facility] ENDOSCOPY;  Service: Gastroenterology;  Laterality: N/A;   ESOPHAGOGASTRODUODENOSCOPY (EGD) WITH PROPOFOL  N/A 04/01/2018   Procedure: ESOPHAGOGASTRODUODENOSCOPY (EGD) WITH PROPOFOL ;  Surgeon: Mariner Gladis RAYMOND, MD;  Location: [facility] ENDOSCOPY;  Service: Endoscopy;  Laterality: N/A;  ESOPHAGOGASTRODUODENOSCOPY (EGD) WITH PROPOFOL  N/A 06/01/2022   Procedure: ESOPHAGOGASTRODUODENOSCOPY (EGD) WITH PROPOFOL ;  Surgeon: Therisa Bi, MD;  Location: [facility] ENDOSCOPY;  Service: Gastroenterology;  Laterality: N/A;   EYE SURGERY     HERNIA REPAIR     JOINT REPLACEMENT Bilateral    hips  RT+  LEFT X2    LEFT HEART CATH AND CORONARY ANGIOGRAPHY N/A 12/22/2020   Procedure: LEFT HEART CATH AND CORONARY ANGIOGRAPHY;  Surgeon: Ammon Blunt, MD;  Location: [facility] INVASIVE CV LAB;  Service: Cardiovascular;  Laterality: N/A;   LEFT HEART CATH AND  CORONARY ANGIOGRAPHY Left 08/23/2021   Procedure: LEFT HEART CATH AND CORONARY ANGIOGRAPHY;  Surgeon: Hester Wolm PARAS, MD;  Location: [facility] INVASIVE CV LAB;  Service: Cardiovascular;  Laterality: Left;   LUMBAR LAMINECTOMY/DECOMPRESSION MICRODISCECTOMY Left 09/13/2016   Procedure: Microdiscectomy - Lumbar two-three,  Lumbar three- - left;  Surgeon: Onetha Kuba, MD;  Location: [facility] OR;  Service: Neurosurgery;  Laterality: Left;   SPINAL CORD STIMULATOR INSERTION  07/08/2019   TONSILLECTOMY     Patient Active Problem List   Diagnosis Date Noted   Diabetes mellitus treated with oral medication (HCC) 04/05/2023   Subareolar mass of left breast 10/03/2022   COPD (chronic obstructive pulmonary disease) (HCC) 10/31/2021   Shortness of breath on exertion 08/04/2021   Diabetes mellitus with proteinuria (HCC) 04/04/2021   GERD without esophagitis 04/02/2021   Coronary artery disease 12/28/2020   History of non-ST elevation myocardial infarction (NSTEMI) 12/21/2020   Pain in right shin 10/25/2020   Peripheral vascular disease 08/06/2020   Chronic pain syndrome 07/06/2020   Cervical facet joint syndrome 04/08/2020   Spinal cord stimulator status 12/11/2019   Elevated TSH 08/20/2019   CKD (chronic kidney disease) stage 3, GFR 30-59 ml/min (HCC) 01/19/2019   Acquired thrombophilia 01/19/2019   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   Postlaminectomy syndrome, lumbar region 01/16/2019   History of fusion of lumbar spine (L2-L5) 01/16/2019   Chronic radicular lumbar pain 01/16/2019   HNP (herniated nucleus pulposus), lumbar 04/29/2018   Advanced care planning/counseling discussion 09/28/2016   Spinal stenosis, lumbar region, 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/13/2016   Hyperlipidemia associated with type 2 diabetes mellitus (HCC) 07/14/2015   Symptomatic anemia 06/30/2015   Benign prostatic hyperplasia without lower urinary tract symptoms 06/02/2015   OSA (obstructive sleep apnea) 03/23/2015    Hypertension associated with diabetes (HCC) 09/28/2014   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HCC) 09/28/2014   H/O prior ablation treatment 10/19/2011   AF (paroxysmal atrial fibrillation) (HCC) 10/19/2011   PCP: Valerio Melanie DASEN, NP  REFERRING PROVIDER: Maree Jannett POUR, MD   REFERRING DIAG: Peripheral Neuropathy  RATIONALE FOR EVALUATION AND TREATMENT: Rehabilitation  THERAPY DIAG: Muscle weakness (generalized)  Other low back pain  Difficulty in walking, not elsewhere classified  Unsteadiness on feet  ONSET DATE: 01/25/23 (acute on chronic)  FOLLOW-UP APPT SCHEDULED WITH REFERRING PROVIDER: Yes   FROM INITIAL EVALUATION SUBJECTIVE:  SUBJECTIVE STATEMENT:  Imbalance and LE weakness;  PERTINENT HISTORY:  Lower extremity weakness in patient with 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 He reports weakness in both legs and numbness in toes post surgery in 2018. He experienced a fall on 01/25/2023 at home, resulting in a L hip injury. No residual pain in hip at this time. No additional falls since December. He reports progressive LE weakness impairing his functional ability at home. He is known to this clinic from prior episodes of care for similar concerns.  Prior history: 11/02/22 Patient reports to physical therapy today with a chief concern of muscle weakness and imbalance. Patient reports progressive worsening in balance and muscular strength. He states that he has loss of  sensation in his toes he reports they are asleep. Patient reports pain in the anterior thigh and bilateral calves. He currently uses an loftstand crutch in his RUE. Lately patient has reported difficulty with dressing LE, performing fine motor movements with left hand. He denies falls, saddle parasthesia, nausea, vomitting, night sweats.    10/17/2022 Patient reported he is not doing well lately. He has been seeing Dr. Clois. He reports his legs are so weak that he can hardly walk, so he has been using a crutch for the last 1-2 years. Reported numbness and loss of sensation in his toes. He also reported that recently his left hand has began to has some numbness to where it feels like it is asleep. Denies pain, loss of sensation, coldness, burning. Denies falls.  Imaging: NCS conducted on 11/12/2018: Abnormal study. 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 There is also preliminary evidence of a superimposed left lower lumbosacral polyradiculopathy, based on NCV and distal needle exam results. I cannot test lumbar paraspinals due to patient on Eliquis .    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 Yes. 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  Recent memory is intact.  Remote memory is intact.  Attention span and concentration are intact.  Expressive speech is intact.  Patient's fund of knowledge is  within normal limits for educational level.    Gross Musculoskeletal Assessment Tremor: None Bulk: Normal Tone: Normal  Posture: Forward head and rounded shoulders  AROM Deferred specific measurements. Functional motion intact;  LE MMT: MMT (out of 5) Right  Left   Hip flexion 4 4+  Hip extension    Hip abduction (seated) 3+ 3+  Hip adduction (seated 3+ 3+  Hip internal rotation    Hip external rotation    Knee flexion (seated) 4 4  Knee extension 4 4  Ankle dorsiflexion 4 4  Ankle plantarflexion    Ankle inversion    Ankle eversion    (* = pain; Blank rows = not tested)  Transfers: Assistive device utilized: Single Lofstrand RUE  Sit to stand: Modified independence Stand to sit: Modified independence Chair to chair: Modified independence Floor: Deferred  Stairs: Level of Assistance: CGA Stair Negotiation Technique: Alternating Pattern  with Bilateral Rails Number of Stairs: 4  Height of Stairs: 6  Comments: Slow and labored ascending/descending. No overt LOB;  Gait: Gait pattern: decreased step length- Right, decreased step length- Left, trunk flexed, poor foot clearance- Right, and poor foot clearance- Left Distance walked:  150' over the course of evaluation Assistive device utilized: Single Lofstrand in RUE Level of assistance: Modified independence Comments: Slow and labored ambulation. Decreased self selected speed  Functional Outcome Measures  Results Comments  BERG 40/56 Increased fall risk  DGI    FGA    TUG 22.2 seconds Increased fall risk  5TSTS Unable Increased fall risk  6 Minute Walk Test    10 Meter Gait Speed Self-selected: 20.5s = 0.49 m/s; Fastest: 18.0s = 0.56 m/s Below community ambulation speed  (Blank rows = not tested)   TODAY'S TREATMENT: 12/04/2023    SUBJECTIVE: Patient reports ongoing R shin pain sensitive to touch and mild LBP at start of tx session. No falls or stumbles reported since last session. Pt has no questions upon arrival. Pt states that he did a lot of walking yesterday (indoors and outdoors) which tired him out and his muscles are fatigued from it today.    PAIN: 2/10 chronic low back pain. Sharp R shin pain to light touch.   Therapeutic Activity NuStep L1-4 x 10 minutes, for BLE strengthening and warm-up during interval history (5 mins unbilled); Sit to stand with 6 # ball overhead press 2 x 10; Nautilus resisted gait 60# R lateral and L lateral x 3 each, repeated standing rest breaks due to fatigue, pt performs slowly; Fwd step over 6 hurdles in // bars x 3 laps  Seated LAQ with 3# ankle weights 2 x 20 BLE; Standing heel raises 3 x 20 BLE;   Not performed this session: Standing hip strengthening with 3# ankle weights (AW): Hip flexion marches 2 x 20 BLE; Hip abduction x 20 BLE; Hip HS curl 2 x 20 BLE;  Hip extension x 20 BLE;  Standing on AE pad, Alternating Basketball chest press and bounce pass with PT, 2 x 3' Mini squats 2 x 20; SLS hip 3 way cone taps 2 x 8 ea leg; Cross-over stepping in // bars without UE support x multiple lengths; Forward 6 step-ups alternating leading LE x 10 each; Seated clams with manual resistance from therapist 2 x  20; Seated adductor squeezes with manual resistance from therapist 2 x 20;   PATIENT EDUCATION:  Education details: Pt educated throughout session about proper posture and technique with exercises. Improved exercise technique, movement at target joints, use  of target muscles after min to mod verbal, visual, tactile cues.  Person educated: Patient Education method: Explanation, Verbal cues, and Handouts Education comprehension: verbalized understanding and returned demonstration   HOME EXERCISE PROGRAM:  Access Code: [email or web address] URL: [email or web address] Date: 05/31/2023 Prepared by: Selinda Eck  Exercises - Seated Long Arc Quad  - 1 x daily - 3-4 x weekly - 3 sets - 10 reps - 2s hold - Seated Hip Abduction with Resistance  - 1 x daily - 3-4 x weekly - 3 sets - 10 reps - 2s hold - Heel Raises with Counter Support  - 1 x daily - 3-4 x weekly - 3 sets - 10 reps - 2s hold - Mini Squat with Counter Support  - 1 x daily - 3-4 x weekly - 3 sets - 10 reps - Semi-Tandem Balance at [employer] Eyes Open  - 1 x daily - 7 x weekly - 2-3 sets - 10 reps - 10-30 hold   ASSESSMENT:  CLINICAL IMPRESSION:   Session focused on BLE strengthening and patient remains motivated to participate in all interventions during tx session. Pt required multiple standing rest breaks between reps during resisted lateral walking on Nautilus to recover from bilateral LE fatigue. Pt noted to have increased difficulty performing eccentric lateral walking to the left side secondary to current deficits in left hip strength and endurance. Pt reported an increase in low back pain to a 7/10 with standing activities which eases with seated rest breaks. Pt will continue to benefit from PT services to address remaining deficits in strength, balance, and mobility in order to improve function at home and decrease his risk for falls.     OBJECTIVE IMPAIRMENTS: Abnormal gait, decreased balance, difficulty walking,  decreased strength, impaired sensation, and pain.   ACTIVITY LIMITATIONS: bending, standing, squatting, stairs, transfers, and caring for others  PARTICIPATION LIMITATIONS: meal prep, cleaning, laundry, shopping, and community activity  PERSONAL FACTORS: Age, Past/current experiences, Time since onset of injury/illness/exacerbation, and 3+ comorbidities: generalized polyneuropathy, COPD, DM, NSTEMI, and spinal stenosis are also affecting patient's functional outcome.   REHAB POTENTIAL: Fair    CLINICAL DECISION MAKING: Unstable/unpredictable  EVALUATION COMPLEXITY: High   GOALS: Goals reviewed with patient? No  SHORT TERM GOALS: Target date: 07/10/2023  Pt will be independent with HEP in order to improve strength and balance in order to decrease fall risk and improve function at home. Baseline:  Goal status: MET   LONG TERM GOALS: Target date: 11/15/2023  Pt will improve ABC by at least 13% in order to demonstrate clinically significant improvement in balance confidence.  Baseline: 20%; 07/10/23: 30%, 08/13/23: 36.25% Goal status: ACHIEVED  2.  Pt will improve BERG by at least 3 points in order to demonstrate clinically significant improvement in balance.   Baseline: 40/56; 07/10/23: 50/56; 08/13/23: 52/56; Goal status: ACHIEVED  3.  Pt will increase self-selected to at least 0.6 m/s in order to demonstrate clinically significant improvement in limited community ambulation.         Baseline: self-selected: 0.49 m/s; 07/10/23: 0.52 m/s; 08/13/23: 0.49 m/s; Goal status: NOT MET  4. Pt will be able to complete 5TSTS without UE support in order to demonstrate clinically significant improvement in LE strength. Baseline: Unable to perform sit to stand without heavy UE assist; 07/10/23: 11.4s; 08/13/23: 12.72 s Goal status: ACHIEVED  5. Pt will decrease TUG to below 14 seconds/decrease in order to demonstrate decreased fall risk.  Baseline: 22.2s; 07/10/23: 14.0s; 08/13/23: 24.53s no  crutch,  16.20s with crutch Goal status: ONGOING  PLAN: PT FREQUENCY: 2x/week  PT DURATION: 12 weeks  PLANNED INTERVENTIONS: Therapeutic exercises, Therapeutic activity, Neuromuscular re-education, Balance training, Gait training, Patient/Family education, Self Care, Joint mobilization, Joint manipulation, Vestibular training, Canalith repositioning, Orthotic/Fit training, DME instructions, Dry Needling, Electrical stimulation, Spinal manipulation, Spinal mobilization, Cryotherapy, Moist heat, Taping, Traction, Ultrasound, Ionotophoresis 4mg /ml Dexamethasone , Manual therapy, and Re-evaluation.  PLAN FOR NEXT SESSION: progress strengthening exercises, walking/ambulation speed and endurance tasks, modify/progress HEP as needed;   Curtistine Bracket, SPT  Jason D Huprich PT, DPT, GCS  4:55 PM,12/04/23

## 2023-12-06 ENCOUNTER — Ambulatory Visit: Admitting: Student in an Organized Health Care Education/Training Program

## 2023-12-07 NOTE — Therapy (Incomplete)
 OUTPATIENT PHYSICAL THERAPY BALANCE TREATMENT NOTE   Patient Name: Sean Reyes. MRN: [id number] DOB:22-Oct-1940, 83 y.o., male Today's Date: 12/07/2023  END OF SESSION:   Past Medical History:  Diagnosis Date   Anemia    Anxiety    Arthritis    Arthritis of neck    Atrial fibrillation (HCC)    Cataracts, bilateral    Complication of anesthesia    pt reports low BP's after surgery at [facility] and difficulty awakening   Depression    Diabetes (HCC)    dx 6-8 yrs ago   Dysrhythmia    a-fib   GERD (gastroesophageal reflux disease)    OCC TAKES ALKA SELTZER   History of kidney stones    10-15 yrs ago   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   Hyperlipidemia    Hypertension    Myocardial infarction (HCC) 12/2020   Nocturia    S/P ablation of atrial fibrillation    Ablative therapy   Sleep apnea    CPAP    Spinal stenosis    Tachycardia, unspecified    Past Surgical History:  Procedure Laterality Date   ABLATION     ANTERIOR LAT LUMBAR FUSION N/A 06/27/2017   Procedure: Anterior Lateral Lumbar Interbody  Fusion - Lumbar Two-Lumbar Three - Lumbar Three-Lumbar Four, Posterior Lumbar Interbody Fusion Lumbar Four- Five;  Surgeon: Onetha Kuba, MD;  Location: [facility] OR;  Service: Neurosurgery;  Laterality: N/A;  Anterior Lateral Lumbar Interbody  Fusion - Lumbar Two-Lumbar Three - Lumbar Three-Lumbar Four, Posterior Lumbar Interbody Fusion Lumbar Four- Five   BACK SURGERY     CARDIAC CATHETERIZATION     CARDIOVERSION N/A 08/29/2018   Procedure: CARDIOVERSION;  Surgeon: Hester Wolm PARAS, MD;  Location: [facility] ORS;  Service: Cardiovascular;  Laterality: N/A;   CARDIOVERSION N/A 09/24/2018   Procedure: CARDIOVERSION;  Surgeon: Hester Wolm PARAS, MD;  Location: [facility] ORS;  Service: Cardiovascular;  Laterality: N/A;   COLONOSCOPY WITH PROPOFOL  N/A 10/05/2015   Procedure: COLONOSCOPY WITH PROPOFOL ;  Surgeon: Gladis RAYMOND Mariner, MD;  Location: [facility] ENDOSCOPY;  Service:  Endoscopy;  Laterality: N/A;   COLONOSCOPY WITH PROPOFOL  N/A 11/01/2020   Procedure: COLONOSCOPY WITH PROPOFOL ;  Surgeon: Therisa Bi, MD;  Location: [facility] ENDOSCOPY;  Service: Gastroenterology;  Laterality: N/A;   CORONARY STENT INTERVENTION N/A 12/22/2020   Procedure: CORONARY STENT INTERVENTION;  Surgeon: Ammon Blunt, MD;  Location: [facility] INVASIVE CV LAB;  Service: Cardiovascular;  Laterality: N/A;   ESOPHAGOGASTRODUODENOSCOPY N/A 11/01/2020   Procedure: ESOPHAGOGASTRODUODENOSCOPY (EGD);  Surgeon: Therisa Bi, MD;  Location: [facility] ENDOSCOPY;  Service: Gastroenterology;  Laterality: N/A;   ESOPHAGOGASTRODUODENOSCOPY (EGD) WITH PROPOFOL  N/A 04/01/2018   Procedure: ESOPHAGOGASTRODUODENOSCOPY (EGD) WITH PROPOFOL ;  Surgeon: Mariner Gladis RAYMOND, MD;  Location: [facility] ENDOSCOPY;  Service: Endoscopy;  Laterality: N/A;   ESOPHAGOGASTRODUODENOSCOPY (EGD) WITH PROPOFOL  N/A 06/01/2022   Procedure: ESOPHAGOGASTRODUODENOSCOPY (EGD) WITH PROPOFOL ;  Surgeon: Therisa Bi, MD;  Location: [facility] ENDOSCOPY;  Service: Gastroenterology;  Laterality: N/A;   EYE SURGERY     HERNIA REPAIR     JOINT REPLACEMENT Bilateral    hips  RT+  LEFT X2    LEFT HEART CATH AND CORONARY ANGIOGRAPHY N/A 12/22/2020   Procedure: LEFT HEART CATH AND CORONARY ANGIOGRAPHY;  Surgeon: Ammon Blunt, MD;  Location: [facility] INVASIVE CV LAB;  Service: Cardiovascular;  Laterality: N/A;   LEFT HEART CATH AND CORONARY ANGIOGRAPHY Left 08/23/2021   Procedure: LEFT HEART CATH AND CORONARY ANGIOGRAPHY;  Surgeon: Hester Wolm PARAS, MD;  Location:  [facility] INVASIVE CV LAB;  Service: Cardiovascular;  Laterality: Left;   LUMBAR LAMINECTOMY/DECOMPRESSION MICRODISCECTOMY Left 09/13/2016   Procedure: Microdiscectomy - Lumbar two-three,  Lumbar three- - left;  Surgeon: Onetha Kuba, MD;  Location: [facility] OR;  Service: Neurosurgery;  Laterality: Left;   SPINAL CORD STIMULATOR INSERTION  07/08/2019   TONSILLECTOMY     Patient Active Problem List   Diagnosis  Date Noted   Diabetes mellitus treated with oral medication (HCC) 04/05/2023   Subareolar mass of left breast 10/03/2022   COPD (chronic obstructive pulmonary disease) (HCC) 10/31/2021   Shortness of breath on exertion 08/04/2021   Diabetes mellitus with proteinuria (HCC) 04/04/2021   GERD without esophagitis 04/02/2021   Coronary artery disease 12/28/2020   History of non-ST elevation myocardial infarction (NSTEMI) 12/21/2020   Pain in right shin 10/25/2020   Peripheral vascular disease 08/06/2020   Chronic pain syndrome 07/06/2020   Cervical facet joint syndrome 04/08/2020   Spinal cord stimulator status 12/11/2019   Elevated TSH 08/20/2019   CKD (chronic kidney disease) stage 3, GFR 30-59 ml/min (HCC) 01/19/2019   Acquired thrombophilia 01/19/2019   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   Postlaminectomy syndrome, lumbar region 01/16/2019   History of fusion of lumbar spine (L2-L5) 01/16/2019   Chronic radicular lumbar pain 01/16/2019   HNP (herniated nucleus pulposus), lumbar 04/29/2018   Advanced care planning/counseling discussion 09/28/2016   Spinal stenosis, lumbar region, 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/13/2016   Hyperlipidemia associated with type 2 diabetes mellitus (HCC) 07/14/2015   Symptomatic anemia 06/30/2015   Benign prostatic hyperplasia without lower urinary tract symptoms 06/02/2015   OSA (obstructive sleep apnea) 03/23/2015   Hypertension associated with diabetes (HCC) 09/28/2014   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HCC) 09/28/2014   H/O prior ablation treatment 10/19/2011   AF (paroxysmal atrial fibrillation) (HCC) 10/19/2011   PCP: Valerio Melanie DASEN, NP  REFERRING PROVIDER: Maree Jannett POUR, MD   REFERRING DIAG: Peripheral Neuropathy  RATIONALE FOR EVALUATION AND TREATMENT: Rehabilitation  THERAPY DIAG: Muscle weakness (generalized)  Other low back pain  Difficulty in walking, not elsewhere classified  ONSET DATE: 01/25/23 (acute on  chronic)  FOLLOW-UP APPT SCHEDULED WITH REFERRING PROVIDER: Yes   FROM INITIAL EVALUATION SUBJECTIVE:                                                                                                                                                                                         SUBJECTIVE STATEMENT:  Imbalance and LE weakness;  PERTINENT HISTORY:  Lower extremity weakness in patient with history of 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 He reports weakness in both legs and numbness in toes post surgery in 2018. He experienced a fall on 01/25/2023 at home, resulting in a L hip injury. No residual pain in hip at this time. No additional falls since December. He reports progressive LE weakness impairing his functional ability at home. He is known to this clinic from prior episodes of care for similar concerns.  Prior history: 11/02/22 Patient reports to physical therapy today with a chief concern of muscle weakness and imbalance. Patient reports progressive worsening in balance and muscular strength. He states that he has loss of sensation in his toes he reports they are asleep. Patient reports pain in the anterior thigh and bilateral calves. He currently uses an loftstand crutch in his RUE. Lately patient has reported difficulty with dressing LE, performing fine motor movements with left hand. He denies falls, saddle parasthesia, nausea, vomitting, night sweats.    10/17/2022 Patient reported he is not doing well lately. He has been seeing Dr. Clois. He reports his legs are so weak that he can hardly walk, so he has been using a crutch for the last 1-2 years. Reported numbness and loss  of sensation in his toes. He also reported that recently his left hand has began to has some numbness to where it feels like it is asleep. Denies pain, loss of sensation, coldness, burning. Denies falls.  Imaging: NCS conducted on 11/12/2018: Abnormal study. 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 There is also preliminary evidence of a superimposed left lower lumbosacral polyradiculopathy, based on NCV and distal needle exam results. I cannot test lumbar paraspinals due to patient on Eliquis .    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 Yes. 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  Recent  memory is intact.  Remote memory is intact.  Attention span and concentration are intact.  Expressive speech is intact.  Patient's fund of knowledge is within normal limits for educational level.    Gross Musculoskeletal Assessment Tremor: None Bulk: Normal Tone: Normal  Posture: Forward head and rounded shoulders  AROM Deferred specific measurements. Functional motion intact;  LE MMT: MMT (  out of 5) Right  Left   Hip flexion 4 4+  Hip extension    Hip abduction (seated) 3+ 3+  Hip adduction (seated 3+ 3+  Hip internal rotation    Hip external rotation    Knee flexion (seated) 4 4  Knee extension 4 4  Ankle dorsiflexion 4 4  Ankle plantarflexion    Ankle inversion    Ankle eversion    (* = pain; Blank rows = not tested)  Transfers: Assistive device utilized: Single Lofstrand RUE  Sit to stand: Modified independence Stand to sit: Modified independence Chair to chair: Modified independence Floor: Deferred  Stairs: Level of Assistance: CGA Stair Negotiation Technique: Alternating Pattern  with Bilateral Rails Number of Stairs: 4  Height of Stairs: 6  Comments: Slow and labored ascending/descending. No overt LOB;  Gait: Gait pattern: decreased step length- Right, decreased step length- Left, trunk flexed, poor foot clearance- Right, and poor foot clearance- Left Distance walked: 150' over the course of evaluation Assistive device utilized: Single Lofstrand in RUE Level of assistance: Modified independence Comments: Slow and labored ambulation. Decreased self selected speed  Functional Outcome Measures  Results Comments  BERG 40/56 Increased fall risk  DGI    FGA    TUG 22.2 seconds Increased fall risk  5TSTS Unable Increased fall risk  6 Minute Walk Test    10 Meter Gait Speed Self-selected: 20.5s = 0.49 m/s; Fastest: 18.0s = 0.56 m/s Below community ambulation speed  (Blank rows = not tested)   TODAY'S TREATMENT: 12/07/2023    SUBJECTIVE: Patient  reports ongoing R shin pain sensitive to touch and mild LBP at start of tx session. No falls or stumbles reported since last session. Pt has no questions upon arrival. Pt states that he did a lot of walking yesterday (indoors and outdoors) which tired him out and his muscles are fatigued from it today.    PAIN: 2/10 chronic low back pain. Sharp R shin pain to light touch.   Therapeutic Activity NuStep L1-4 x 10 minutes, for BLE strengthening and warm-up during interval history (5 mins unbilled); Sit to stand with 6 # ball overhead press 2 x 10; Nautilus resisted gait 60# R lateral and L lateral x 3 each, repeated standing rest breaks due to fatigue, pt performs slowly; Fwd step over 6 hurdles in // bars x 3 laps  Seated LAQ with 3# ankle weights 2 x 20 BLE; Standing heel raises 3 x 20 BLE;   Not performed this session: Standing hip strengthening with 3# ankle weights (AW): Hip flexion marches 2 x 20 BLE; Hip abduction x 20 BLE; Hip HS curl 2 x 20 BLE;  Hip extension x 20 BLE;  Standing on AE pad, Alternating Basketball chest press and bounce pass with PT, 2 x 3' Mini squats 2 x 20; SLS hip 3 way cone taps 2 x 8 ea leg; Cross-over stepping in // bars without UE support x multiple lengths; Forward 6 step-ups alternating leading LE x 10 each; Seated clams with manual resistance from therapist 2 x 20; Seated adductor squeezes with manual resistance from therapist 2 x 20;   PATIENT EDUCATION:  Education details: Pt educated throughout session about proper posture and technique with exercises. Improved exercise technique, movement at target joints, use of target muscles after min to mod verbal, visual, tactile cues.  Person educated: Patient Education method: Explanation, Verbal cues, and Handouts Education comprehension: verbalized understanding and returned demonstration   HOME EXERCISE PROGRAM:  Access Code: [email or web address]  URL: [email or web address] Date:  05/31/2023 Prepared by: Selinda Eck  Exercises - Seated Long Arc Quad  - 1 x daily - 3-4 x weekly - 3 sets - 10 reps - 2s hold - Seated Hip Abduction with Resistance  - 1 x daily - 3-4 x weekly - 3 sets - 10 reps - 2s hold - Heel Raises with Counter Support  - 1 x daily - 3-4 x weekly - 3 sets - 10 reps - 2s hold - Mini Squat with Counter Support  - 1 x daily - 3-4 x weekly - 3 sets - 10 reps - Semi-Tandem Balance at [employer] Eyes Open  - 1 x daily - 7 x weekly - 2-3 sets - 10 reps - 10-30 hold   ASSESSMENT:  CLINICAL IMPRESSION:   Session focused on BLE strengthening and patient remains motivated to participate in all interventions during tx session. Pt required multiple standing rest breaks between reps during resisted lateral walking on Nautilus to recover from bilateral LE fatigue. Pt noted to have increased difficulty performing eccentric lateral walking to the left side secondary to current deficits in left hip strength and endurance. Pt reported an increase in low back pain to a 7/10 with standing activities which eases with seated rest breaks. Pt will continue to benefit from PT services to address remaining deficits in strength, balance, and mobility in order to improve function at home and decrease his risk for falls.     OBJECTIVE IMPAIRMENTS: Abnormal gait, decreased balance, difficulty walking, decreased strength, impaired sensation, and pain.   ACTIVITY LIMITATIONS: bending, standing, squatting, stairs, transfers, and caring for others  PARTICIPATION LIMITATIONS: meal prep, cleaning, laundry, shopping, and community activity  PERSONAL FACTORS: Age, Past/current experiences, Time since onset of injury/illness/exacerbation, and 3+ comorbidities: generalized polyneuropathy, COPD, DM, NSTEMI, and spinal stenosis are also affecting patient's functional outcome.   REHAB POTENTIAL: Fair    CLINICAL DECISION MAKING: Unstable/unpredictable  EVALUATION COMPLEXITY:  High   GOALS: Goals reviewed with patient? No  SHORT TERM GOALS: Target date: 07/10/2023  Pt will be independent with HEP in order to improve strength and balance in order to decrease fall risk and improve function at home. Baseline:  Goal status: MET   LONG TERM GOALS: Target date: 11/15/2023  Pt will improve ABC by at least 13% in order to demonstrate clinically significant improvement in balance confidence.  Baseline: 20%; 07/10/23: 30%, 08/13/23: 36.25% Goal status: ACHIEVED  2.  Pt will improve BERG by at least 3 points in order to demonstrate clinically significant improvement in balance.   Baseline: 40/56; 07/10/23: 50/56; 08/13/23: 52/56; Goal status: ACHIEVED  3.  Pt will increase self-selected to at least 0.6 m/s in order to demonstrate clinically significant improvement in limited community ambulation.         Baseline: self-selected: 0.49 m/s; 07/10/23: 0.52 m/s; 08/13/23: 0.49 m/s; Goal status: NOT MET  4. Pt will be able to complete 5TSTS without UE support in order to demonstrate clinically significant improvement in LE strength. Baseline: Unable to perform sit to stand without heavy UE assist; 07/10/23: 11.4s; 08/13/23: 12.72 s Goal status: ACHIEVED  5. Pt will decrease TUG to below 14 seconds/decrease in order to demonstrate decreased fall risk.  Baseline: 22.2s; 07/10/23: 14.0s; 08/13/23: 24.53s no crutch, 16.20s with crutch Goal status: ONGOING  PLAN: PT FREQUENCY: 2x/week  PT DURATION: 12 weeks  PLANNED INTERVENTIONS: Therapeutic exercises, Therapeutic activity, Neuromuscular re-education, Balance training, Gait training, Patient/Family education, Self Care, Joint mobilization, Joint manipulation, Vestibular  training, Canalith repositioning, Orthotic/Fit training, DME instructions, Dry Needling, Electrical stimulation, Spinal manipulation, Spinal mobilization, Cryotherapy, Moist heat, Taping, Traction, Ultrasound, Ionotophoresis 4mg /ml Dexamethasone , Manual  therapy, and Re-evaluation.  PLAN FOR NEXT SESSION: progress strengthening exercises, walking/ambulation speed and endurance tasks, modify/progress HEP as needed;   Evalene Vath D Jamika Sadek PT, DPT, GCS  4:40 PM,12/07/23

## 2023-12-11 ENCOUNTER — Ambulatory Visit

## 2023-12-11 DIAGNOSIS — M6281 Muscle weakness (generalized): Secondary | ICD-10-CM

## 2023-12-11 DIAGNOSIS — M5459 Other low back pain: Secondary | ICD-10-CM

## 2023-12-11 DIAGNOSIS — R262 Difficulty in walking, not elsewhere classified: Secondary | ICD-10-CM

## 2023-12-13 ENCOUNTER — Encounter: Payer: Self-pay | Admitting: Student in an Organized Health Care Education/Training Program

## 2023-12-13 ENCOUNTER — Ambulatory Visit (INDEPENDENT_AMBULATORY_CARE_PROVIDER_SITE_OTHER): Admitting: Student in an Organized Health Care Education/Training Program

## 2023-12-13 VITALS — BP 138/68 | HR 68 | Temp 97.7°F | Ht 73.0 in | Wt 185.4 lb

## 2023-12-13 DIAGNOSIS — I2510 Atherosclerotic heart disease of native coronary artery without angina pectoris: Secondary | ICD-10-CM | POA: Diagnosis not present

## 2023-12-13 DIAGNOSIS — R0602 Shortness of breath: Secondary | ICD-10-CM | POA: Diagnosis not present

## 2023-12-13 DIAGNOSIS — J449 Chronic obstructive pulmonary disease, unspecified: Secondary | ICD-10-CM

## 2023-12-13 MED ORDER — UMECLIDINIUM-VILANTEROL 62.5-25 MCG/ACT IN AEPB: 1.0000 | INHALATION_SPRAY | Freq: Every day | RESPIRATORY_TRACT | 12 refills | Status: AC

## 2023-12-13 NOTE — Progress Notes (Signed)
 Assessment & Plan:   #Shortness of breath on exertion #CAD #Afib  He presents with exertional dyspnea and occasional left ankle swelling which suggests possible HFpEF. We had empirically prescribed him LABA/LAMA therapy with Anoro Ellipta  which has helped his symptoms and I've recommended that he continue. Lungs are clear on examination today. PFT's last year showed a reduced FEV/FVC ratio with mild drop in DLCO. 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 PFT's also showed a drop in TLC and DLCO, though chest CT showed no signs of ILD.  Wil order a repeat pulmonary function test within six months to follow up on the abnormalities noted. If his DLCO drops, will consider referring back to his cardiologist for a RHC to assess if his symptoms could be due to HFpEF (given underlying CAD with DES to RCA and Afib). Other considerations for his symptoms include chronotropic insufficiency with his rate control. Patient unable to perform CPET due to his multiple joint aches and pains.  - umeclidinium-vilanterol (ANORO ELLIPTA ) 62.5-25 MCG/ACT AEPB; Inhale 1 puff into the lungs daily.  Dispense: 90 each; Refill: 12 - Pulmonary Function Test; Future   Return in about 6 months (around 06/12/2024).  Belva November, MD [facility]  I spent 30 minutes caring for this patient today, including preparing to see the patient, obtaining a medical history , reviewing a separately obtained history, performing a medically appropriate examination and/or evaluation, counseling and educating the patient/family/caregiver, ordering medications, tests, or procedures, documenting clinical information in the electronic health record, and independently interpreting results (not separately reported/billed) and communicating results to the patient/family/caregiver  End of visit medications:  Meds ordered this encounter  Medications    umeclidinium-vilanterol (ANORO ELLIPTA ) 62.5-25 MCG/ACT AEPB    Sig: Inhale 1 puff into the lungs daily.    Dispense:  90 each    Refill:  12    Pt needs appointment with provider to continue medication.     Current Outpatient Medications:    amLODipine  (NORVASC ) 5 MG tablet, Take 1.5 tablets (7.5 mg total) by mouth at bedtime. (Patient taking differently: Take 5 mg by mouth at bedtime.), Disp: 135 tablet, Rfl: 3   benazepril  (LOTENSIN ) 20 MG tablet, Take 1.5 tablets (30 mg total) by mouth daily., Disp: 135 tablet, Rfl: 2   cyanocobalamin  1000 MCG tablet, Take 1,000 mcg by mouth daily., Disp: , Rfl:    ELIQUIS  5 MG TABS tablet, Take 1 tablet (5 mg total) by mouth 2 (two) times daily., Disp: 180 tablet, Rfl: 3   EPINEPHrine  0.3 mg/0.3 mL IJ SOAJ injection, Inject 0.3 mg into the muscle as needed for anaphylaxis., Disp: 1 each, Rfl: 0   ferrous sulfate 325 (65 FE) MG EC tablet, Take 325 mg by mouth daily with breakfast., Disp: , Rfl:    finasteride  (PROSCAR ) 5 MG tablet, Take 1 tablet (5 mg total) by mouth daily., Disp: 90 tablet, Rfl: 3   metFORMIN  (GLUCOPHAGE ) 500 MG tablet, Take 1 tablet (500 mg total) by mouth 2 (two) times daily with a meal., Disp: 180 tablet, Rfl: 4   pantoprazole  (PROTONIX ) 40 MG tablet, Take 40 mg by mouth daily., Disp: , Rfl:    prednisoLONE acetate (PRED FORTE) 1 % ophthalmic suspension, Place 1 drop into both eyes daily., Disp: , Rfl:    rosuvastatin  (CRESTOR ) 5 MG tablet, Take 1 tablet (5 mg total) by mouth daily., Disp: 90 tablet, Rfl: 4  solifenacin  (VESICARE ) 5 MG tablet, Take 1 tablet (5 mg total) by mouth daily., Disp: 90 tablet, Rfl: 3   tamsulosin  (FLOMAX ) 0.4 MG CAPS capsule, Take 2 capsules (0.8 mg total) by mouth daily after supper., Disp: 180 capsule, Rfl: 3   umeclidinium-vilanterol (ANORO ELLIPTA ) 62.5-25 MCG/ACT AEPB, Inhale 1 puff into the lungs daily., Disp: 90 each, Rfl: 12   Subjective:   PATIENT ID: Sean Reyes. GENDER: male DOB:  Aug 02, 1940, MRN: [id number]  Chief Complaint  Patient presents with   COPD    DOE. No wheezing. Cough with clear sputum. Anoro- daily helps with his breathing.     HPI  Discussed the use of AI scribe software for clinical note transcription with the patient, who gave verbal consent to proceed.  Sean Reyes. is an 83 year old male who presents for follow-up of shortness of breath.  Return Visit 08/08/2022:  He presents today for follow up. He reports increased shortness of breath and exertional dyspnea compared to a few months ago. Also reporting increased back pain. Patient has no cough, wheeze, chest pain, or chest tightness. He's considered inhaler therapy and feels that his symptoms are significant enough that he'd like to trial them again.   His symptoms started after suffering a heart attack. He has been seen by Dr. Darron at the [facility] center and referred to pulmonary for further workup of his shortness of breath.   He has significant lower back and lower extremity pain, and has had a pain stimulator placed to control his symptoms. He reports that it is limiting to his activities of daily living and he has not been able to do the things he wants to do. He doesn't have any new symptoms. During a prior visit, I prescribed a LAMA/LABA combination therapy that he was hesitant to take after reading the side effects profile. He felt the risk/benefit balance did not favor the initiation of the medication at the time.   In addition to his CAD (RCA stent, 60% distal LAD), he's had an ablation procedure for Afib twice, with improvement. He was maintained on propafenone  and metoprolol  following the procedure. He will have a cardiac monitor placed and will follow up for that.  Return Visit 12/13/2023:  His breathing has remained stable over the past year. He was started on Trelegy one year ago, taking one puff daily. He stopped using it for two to three weeks about a month and a half ago due  to upper lip swelling, which persisted despite discontinuation. He resumed its use and noted slightly worse breathing when off the inhaler.  He experiences shortness of breath primarily with exertion, such as walking long distances, but otherwise feels his breathing is 'pretty good'.  He reports occasional swelling in his left ankle, described as 'a little puffed up' and not worsening. He does not take diuretics but is on Eliquis  for blood thinning.  He mentions frequent coughing, but believes it is not originating from his lungs.     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 His father was also a psychologist, occupational and he used to help him growing up. Denies any exposure to asbestos.  Ancillary information including prior medications, full medical/surgical/family/social histories, and PFTs (when available) are listed below and have been reviewed.    Review of Systems  Constitutional:  Negative for chills, fever, malaise/fatigue  and weight loss.  Respiratory:  Positive for shortness of breath. Negative for cough, hemoptysis, sputum production and wheezing.   Cardiovascular:  Negative for chest pain.     Objective:   Vitals:   12/13/23 1402  BP: 138/68  Pulse: 68  Temp: 97.7 F (36.5 C)  SpO2: 97%  Weight: 185 lb 6.4 oz (84.1 kg)  Height: 6' 1 (1.854 m)   97% on RA  BMI Readings from Last 3 Encounters:  12/13/23 24.46 kg/m  10/17/23 24.66 kg/m  10/11/23 24.67 kg/m   Wt Readings from Last 3 Encounters:  12/13/23 185 lb 6.4 oz (84.1 kg)  10/17/23 186 lb 14.4 oz (84.8 kg)  10/11/23 187 lb (84.8 kg)    Physical Exam Constitutional:      Appearance: Normal appearance.  Cardiovascular:     Rate and Rhythm: Normal rate and regular rhythm.     Pulses: Normal pulses.     Heart sounds: Normal heart sounds.  Pulmonary:     Effort: Pulmonary effort  is normal.     Breath sounds: Normal breath sounds.  Musculoskeletal:     Right lower leg: Edema present.     Left lower leg: Edema present.     Comments: Mild lower extremity pitting edema  Neurological:     General: No focal deficit present.     Mental Status: He is alert and oriented to person, place, and time. Mental status is at baseline.       Ancillary Information    Past Medical History:  Diagnosis Date   Anemia    Anxiety    Arthritis    Arthritis of neck    Atrial fibrillation (HCC)    Cataracts, bilateral    Complication of anesthesia    pt reports low BP's after surgery at [facility] and difficulty awakening   Depression    Diabetes (HCC)    dx 6-8 yrs ago   Dysrhythmia    a-fib   GERD (gastroesophageal reflux disease)    OCC TAKES ALKA SELTZER   History of kidney stones    10-15 yrs ago   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   Hyperlipidemia    Hypertension    Myocardial infarction (HCC) 12/2020   Nocturia    S/P ablation of atrial fibrillation    Ablative therapy   Sleep apnea    CPAP    Spinal stenosis    Tachycardia, unspecified      Family History  Problem Relation Age of Onset   Brain cancer Mother    Other Father        blood clots in his lungs   Kidney disease Neg Hx    Prostate cancer Neg Hx    Kidney cancer Neg Hx    Bladder Cancer Neg Hx      Past Surgical History:  Procedure Laterality Date   ABLATION     ANTERIOR LAT LUMBAR FUSION N/A 06/27/2017   Procedure: Anterior Lateral Lumbar Interbody  Fusion - Lumbar Two-Lumbar Three - Lumbar Three-Lumbar Four, Posterior Lumbar Interbody Fusion Lumbar Four- Five;  Surgeon: Onetha Kuba, MD;  Location: [facility] OR;  Service: Neurosurgery;  Laterality: N/A;  Anterior Lateral Lumbar Interbody  Fusion - Lumbar Two-Lumbar Three - Lumbar Three-Lumbar Four, Posterior Lumbar Interbody Fusion Lumbar Four- Five   BACK SURGERY     CARDIAC CATHETERIZATION     CARDIOVERSION N/A  08/29/2018   Procedure: CARDIOVERSION;  Surgeon: Hester Wolm PARAS, MD;  Location:  [facility] ORS;  Service: Cardiovascular;  Laterality: N/A;   CARDIOVERSION N/A 09/24/2018   Procedure: CARDIOVERSION;  Surgeon: Hester Wolm PARAS, MD;  Location: [facility] ORS;  Service: Cardiovascular;  Laterality: N/A;   COLONOSCOPY WITH PROPOFOL  N/A 10/05/2015   Procedure: COLONOSCOPY WITH PROPOFOL ;  Surgeon: Gladis RAYMOND Mariner, MD;  Location: [facility] ENDOSCOPY;  Service: Endoscopy;  Laterality: N/A;   COLONOSCOPY WITH PROPOFOL  N/A 11/01/2020   Procedure: COLONOSCOPY WITH PROPOFOL ;  Surgeon: Therisa Bi, MD;  Location: [facility] ENDOSCOPY;  Service: Gastroenterology;  Laterality: N/A;   CORONARY STENT INTERVENTION N/A 12/22/2020   Procedure: CORONARY STENT INTERVENTION;  Surgeon: Ammon Blunt, MD;  Location: [facility] INVASIVE CV LAB;  Service: Cardiovascular;  Laterality: N/A;   ESOPHAGOGASTRODUODENOSCOPY N/A 11/01/2020   Procedure: ESOPHAGOGASTRODUODENOSCOPY (EGD);  Surgeon: Therisa Bi, MD;  Location: [facility] ENDOSCOPY;  Service: Gastroenterology;  Laterality: N/A;   ESOPHAGOGASTRODUODENOSCOPY (EGD) WITH PROPOFOL  N/A 04/01/2018   Procedure: ESOPHAGOGASTRODUODENOSCOPY (EGD) WITH PROPOFOL ;  Surgeon: Mariner Gladis RAYMOND, MD;  Location: [facility] ENDOSCOPY;  Service: Endoscopy;  Laterality: N/A;   ESOPHAGOGASTRODUODENOSCOPY (EGD) WITH PROPOFOL  N/A 06/01/2022   Procedure: ESOPHAGOGASTRODUODENOSCOPY (EGD) WITH PROPOFOL ;  Surgeon: Therisa Bi, MD;  Location[facility] ENDOSCOPY;  Service: Gastroenterology;  Laterality: N/A;   EYE SURGERY     HERNIA REPAIR     JOINT REPLACEMENT Bilateral    hips  RT+  LEFT X2    LEFT HEART CATH AND CORONARY ANGIOGRAPHY N/A 12/22/2020   Procedure: LEFT HEART CATH AND CORONARY ANGIOGRAPHY;  Surgeon: Ammon Blunt, MD;  Location: [facility] INVASIVE CV LAB;  Service: Cardiovascular;  Laterality: N/A;   LEFT HEART CATH AND CORONARY ANGIOGRAPHY Left 08/23/2021   Procedure: LEFT HEART CATH AND CORONARY ANGIOGRAPHY;   Surgeon: Hester Wolm PARAS, MD;  Location: [facility] INVASIVE CV LAB;  Service: Cardiovascular;  Laterality: Left;   LUMBAR LAMINECTOMY/DECOMPRESSION MICRODISCECTOMY Left 09/13/2016   Procedure: Microdiscectomy - Lumbar two-three,  Lumbar three- - left;  Surgeon: Onetha Kuba, MD;  Location: [facility] OR;  Service: Neurosurgery;  Laterality: Left;   SPINAL CORD STIMULATOR INSERTION  07/08/2019   TONSILLECTOMY      Social History   Socioeconomic History   Marital status: Married    Spouse name: Channing    Number of children: 2   Years of education: Not on file   Highest education level: High school graduate  Occupational History   Occupation: retired   Tobacco Use   Smoking status: Never   Smokeless tobacco: Never  Vaping Use   Vaping status: Never Used  Substance and Sexual Activity   Alcohol use: No   Drug use: No   Sexual activity: Not on file  Other Topics Concern   Not on file  Social History Narrative   MarriedGets regular exercise.      Lives in [place] with wife/ daughter. Never smoked; no alcohol. 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    Social Drivers of Corporate Investment Banker Strain: Low Risk  (09/19/2022)   Overall Financial Resource Strain (CARDIA)    Difficulty of Paying Living Expenses: Not hard at all  Food Insecurity: No Food Insecurity (09/19/2022)   Hunger Vital Sign    Worried About Running Out of Food in the Last Year: Never true    Ran Out of Food in the Last Year: Never true  Transportation Needs: No Transportation Needs (09/19/2022)   PRAPARE - Administrator, Civil Service (Medical): No    Lack of Transportation (Non-Medical): No  Physical Activity: Insufficiently Active (  11/23/2022)   Exercise Vital Sign    Days of Exercise per Week: 2 days    Minutes of Exercise per Session: 30 min  Stress: No Stress Concern Present (09/19/2022)   [employer] of Occupational Health - Occupational Stress  Questionnaire    Feeling of Stress : Not at all  Social Connections: Socially Integrated (09/19/2022)   Social Connection and Isolation Panel    Frequency of Communication with Friends and Family: More than three times a week    Frequency of Social Gatherings with Friends and Family: Twice a week    Attends Religious Services: More than 4 times per year    Active Member of [employer] or Organizations: Yes    Attends Engineer, Structural: More than 4 times per year    Marital Status: Married  Catering Manager Violence: Not At Risk (09/19/2022)   Humiliation, Afraid, Rape, and Kick questionnaire    Fear of Current or Ex-Partner: No    Emotionally Abused: No    Physically Abused: No    Sexually Abused: No     Allergies  Allergen Reactions   Levaquin [Levofloxacin In D5w] Anaphylaxis and Shortness Of Breath   Shellfish Allergy Anaphylaxis    Has used duraprep, betadine and ioban in previous surgeries in 2019 and 2018 without issue   Amiodarone Other (See Comments)    Tremors and thyroid  toxicity   Adhesive [Tape] Other (See Comments)    Little red bumps under the dressing.  He questions whether is latex related     CBC    Component Value Date/Time   WBC 7.4 10/17/2023 1248   WBC 4.9 04/13/2023 1319   RBC 3.47 (L) 10/17/2023 1248   HGB 10.9 (L) 10/17/2023 1248   HGB 11.6 (L) 10/05/2023 1540   HCT 32.1 (L) 10/17/2023 1248   HCT 35.6 (L) 10/05/2023 1540   PLT 139 (L) 10/17/2023 1248   PLT 169 10/05/2023 1540   MCV 92.5 10/17/2023 1248   MCV 97 10/05/2023 1540   MCH 31.4 10/17/2023 1248   MCHC 34.0 10/17/2023 1248   RDW 13.1 10/17/2023 1248   RDW 13.5 10/05/2023 1540   LYMPHSABS 1.6 10/17/2023 1248   LYMPHSABS 1.9 10/05/2023 1540   MONOABS 0.9 10/17/2023 1248   EOSABS 0.0 10/17/2023 1248   EOSABS 0.0 10/05/2023 1540   BASOSABS 0.0 10/17/2023 1248   BASOSABS 0.0 10/05/2023 1540    Pulmonary Functions Testing Results:    Latest Ref Rng & Units 10/26/2021    2:56 PM   PFT Results  FVC-Pre L 4.03   FVC-Predicted Pre % 87   FVC-Post L 4.03   FVC-Predicted Post % 87   Pre FEV1/FVC % % 58   Post FEV1/FCV % % 63   FEV1-Pre L 2.34   FEV1-Predicted Pre % 70   FEV1-Post L 2.54   DLCO uncorrected ml/min/mmHg 19.67   DLCO UNC% % 73   DLVA Predicted % 80   TLC L 6.11   TLC % Predicted % 79   RV % Predicted % 75     Outpatient Medications Prior to Visit  Medication Sig Dispense Refill   amLODipine  (NORVASC ) 5 MG tablet Take 1.5 tablets (7.5 mg total) by mouth at bedtime. (Patient taking differently: Take 5 mg by mouth at bedtime.) 135 tablet 3   benazepril  (LOTENSIN ) 20 MG tablet Take 1.5 tablets (30 mg total) by mouth daily. 135 tablet 2   cyanocobalamin  1000 MCG tablet Take 1,000 mcg by mouth daily.  ELIQUIS  5 MG TABS tablet Take 1 tablet (5 mg total) by mouth 2 (two) times daily. 180 tablet 3   EPINEPHrine  0.3 mg/0.3 mL IJ SOAJ injection Inject 0.3 mg into the muscle as needed for anaphylaxis. 1 each 0   ferrous sulfate 325 (65 FE) MG EC tablet Take 325 mg by mouth daily with breakfast.     finasteride  (PROSCAR ) 5 MG tablet Take 1 tablet (5 mg total) by mouth daily. 90 tablet 3   metFORMIN  (GLUCOPHAGE ) 500 MG tablet Take 1 tablet (500 mg total) by mouth 2 (two) times daily with a meal. 180 tablet 4   pantoprazole  (PROTONIX ) 40 MG tablet Take 40 mg by mouth daily.     prednisoLONE acetate (PRED FORTE) 1 % ophthalmic suspension Place 1 drop into both eyes daily.     rosuvastatin  (CRESTOR ) 5 MG tablet Take 1 tablet (5 mg total) by mouth daily. 90 tablet 4   solifenacin  (VESICARE ) 5 MG tablet Take 1 tablet (5 mg total) by mouth daily. 90 tablet 3   tamsulosin  (FLOMAX ) 0.4 MG CAPS capsule Take 2 capsules (0.8 mg total) by mouth daily after supper. 180 capsule 3   umeclidinium-vilanterol (ANORO ELLIPTA ) 62.5-25 MCG/ACT AEPB Inhale 1 puff into the lungs daily. 90 each 1   No facility-administered medications prior to visit.

## 2023-12-13 NOTE — Patient Instructions (Signed)
  VISIT SUMMARY: You came in today for a follow-up on your shortness of breath. Your breathing has been stable over the past year, and you have been using Anoro inhaler daily, except for a brief period when you stopped due to upper lip swelling. You mentioned that your shortness of breath occurs mainly with exertion, and you also experience occasional swelling in your left ankle. You are currently on Eliquis  for blood thinning and have a frequent cough that you believe is not from your lungs.  YOUR PLAN: -CHRONIC OBSTRUCTIVE PULMONARY DISEASE (COPD): COPD is a chronic lung condition that makes it hard to breathe, especially during physical activities. You have been managing your symptoms with the Anoro inhaler, which you should continue using daily. We will refill your prescription for another year. We will also schedule a repeat pulmonary function test within the next six months to monitor your lung function. Depending on the results, we may consider additional treatments or a referral to a cardiologist.  INSTRUCTIONS: Please continue using your Anoro inhaler daily as prescribed. We will schedule a repeat pulmonary function test within the next six months. After the test, please schedule a follow-up appointment with us  to discuss the results. If you notice any significant changes in your symptoms or if your ankle swelling worsens, please contact our office immediately.

## 2023-12-18 ENCOUNTER — Ambulatory Visit: Attending: Neurology

## 2023-12-18 DIAGNOSIS — R262 Difficulty in walking, not elsewhere classified: Secondary | ICD-10-CM | POA: Insufficient documentation

## 2023-12-18 DIAGNOSIS — M6281 Muscle weakness (generalized): Secondary | ICD-10-CM | POA: Insufficient documentation

## 2023-12-18 DIAGNOSIS — M5459 Other low back pain: Secondary | ICD-10-CM | POA: Insufficient documentation

## 2023-12-18 DIAGNOSIS — R2681 Unsteadiness on feet: Secondary | ICD-10-CM | POA: Diagnosis present

## 2023-12-18 NOTE — Therapy (Signed)
 OUTPATIENT PHYSICAL THERAPY BALANCE TREATMENT NOTE/PROGRESS NOTE  Dates of reporting period  08/13/23   to   12/20/23    Patient Name: Sean Reyes. MRN: [id number] DOB:Aug 25, 1940, 83 y.o., male Today's Date: 12/21/2023  END OF SESSION:  PT End of Session - 12/20/23 1357     Visit Number 30    Number of Visits 73    Date for Recertification  01/26/24    Authorization Type eval: 05/29/23;    Authorization Time Period Medicare A&B 2025  VL: Based on [place]  No auth req    PT Start Time 1400    PT Stop Time 1445    PT Time Calculation (min) 45 min    Equipment Utilized During Treatment Gait belt    Activity Tolerance Patient tolerated treatment well;Patient limited by fatigue;Patient limited by pain    Behavior During Therapy WFL for tasks assessed/performed         Past Medical History:  Diagnosis Date   Anemia    Anxiety    Arthritis    Arthritis of neck    Atrial fibrillation (HCC)    Cataracts, bilateral    Complication of anesthesia    pt reports low BP's after surgery at [facility] and difficulty awakening   Depression    Diabetes (HCC)    dx 6-8 yrs ago   Dysrhythmia    a-fib   GERD (gastroesophageal reflux disease)    OCC TAKES ALKA SELTZER   History of kidney stones    10-15 yrs ago   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   Hyperlipidemia    Hypertension    Myocardial infarction (HCC) 12/2020   Nocturia    S/P ablation of atrial fibrillation    Ablative therapy   Sleep apnea    CPAP    Spinal stenosis    Tachycardia, unspecified    Past Surgical History:  Procedure Laterality Date   ABLATION     ANTERIOR LAT LUMBAR FUSION N/A 06/27/2017   Procedure: Anterior Lateral Lumbar Interbody  Fusion - Lumbar Two-Lumbar Three - Lumbar Three-Lumbar Four, Posterior Lumbar Interbody Fusion Lumbar Four- Five;  Surgeon: Onetha Kuba, MD;  Location: [facility] OR;  Service: Neurosurgery;  Laterality: N/A;  Anterior Lateral Lumbar Interbody  Fusion - Lumbar  Two-Lumbar Three - Lumbar Three-Lumbar Four, Posterior Lumbar Interbody Fusion Lumbar Four- Five   BACK SURGERY     CARDIAC CATHETERIZATION     CARDIOVERSION N/A 08/29/2018   Procedure: CARDIOVERSION;  Surgeon: Hester Wolm PARAS, MD;  Location: [facility] ORS;  Service: Cardiovascular;  Laterality: N/A;   CARDIOVERSION N/A 09/24/2018   Procedure: CARDIOVERSION;  Surgeon: Hester Wolm PARAS, MD;  Location: [facility] ORS;  Service: Cardiovascular;  Laterality: N/A;   COLONOSCOPY WITH PROPOFOL  N/A 10/05/2015   Procedure: COLONOSCOPY WITH PROPOFOL ;  Surgeon: Gladis RAYMOND Mariner, MD;  Location: [facility] ENDOSCOPY;  Service: Endoscopy;  Laterality: N/A;   COLONOSCOPY WITH PROPOFOL  N/A 11/01/2020   Procedure: COLONOSCOPY WITH PROPOFOL ;  Surgeon: Therisa Bi, MD;  Location: [facility] ENDOSCOPY;  Service: Gastroenterology;  Laterality: N/A;   CORONARY STENT INTERVENTION N/A 12/22/2020   Procedure: CORONARY STENT INTERVENTION;  Surgeon: Ammon Blunt, MD;  Location: [facility] INVASIVE CV LAB;  Service: Cardiovascular;  Laterality: N/A;   ESOPHAGOGASTRODUODENOSCOPY N/A 11/01/2020   Procedure: ESOPHAGOGASTRODUODENOSCOPY (EGD);  Surgeon: Therisa Bi, MD;  Location: [facility] ENDOSCOPY;  Service: Gastroenterology;  Laterality: N/A;   ESOPHAGOGASTRODUODENOSCOPY (EGD) WITH PROPOFOL  N/A 04/01/2018   Procedure: ESOPHAGOGASTRODUODENOSCOPY (EGD) WITH PROPOFOL ;  Surgeon: Mariner,  Gladis PENNER, MD;  Location: [facility] ENDOSCOPY;  Service: Endoscopy;  Laterality: N/A;   ESOPHAGOGASTRODUODENOSCOPY (EGD) WITH PROPOFOL  N/A 06/01/2022   Procedure: ESOPHAGOGASTRODUODENOSCOPY (EGD) WITH PROPOFOL ;  Surgeon: Therisa Bi, MD;  Location: [facility] ENDOSCOPY;  Service: Gastroenterology;  Laterality: N/A;   EYE SURGERY     HERNIA REPAIR     JOINT REPLACEMENT Bilateral    hips  RT+  LEFT X2    LEFT HEART CATH AND CORONARY ANGIOGRAPHY N/A 12/22/2020   Procedure: LEFT HEART CATH AND CORONARY ANGIOGRAPHY;  Surgeon: Ammon Blunt, MD;  Location: [facility] INVASIVE CV  LAB;  Service: Cardiovascular;  Laterality: N/A;   LEFT HEART CATH AND CORONARY ANGIOGRAPHY Left 08/23/2021   Procedure: LEFT HEART CATH AND CORONARY ANGIOGRAPHY;  Surgeon: Hester Wolm PARAS, MD;  Location: [facility] INVASIVE CV LAB;  Service: Cardiovascular;  Laterality: Left;   LUMBAR LAMINECTOMY/DECOMPRESSION MICRODISCECTOMY Left 09/13/2016   Procedure: Microdiscectomy - Lumbar two-three,  Lumbar three- - left;  Surgeon: Onetha Kuba, MD;  Location: [facility] OR;  Service: Neurosurgery;  Laterality: Left;   SPINAL CORD STIMULATOR INSERTION  07/08/2019   TONSILLECTOMY     Patient Active Problem List   Diagnosis Date Noted   Diabetes mellitus treated with oral medication (HCC) 04/05/2023   Subareolar mass of left breast 10/03/2022   COPD (chronic obstructive pulmonary disease) (HCC) 10/31/2021   Shortness of breath on exertion 08/04/2021   Diabetes mellitus with proteinuria (HCC) 04/04/2021   GERD without esophagitis 04/02/2021   Coronary artery disease 12/28/2020   History of non-ST elevation myocardial infarction (NSTEMI) 12/21/2020   Pain in right shin 10/25/2020   Peripheral vascular disease 08/06/2020   Chronic pain syndrome 07/06/2020   Cervical facet joint syndrome 04/08/2020   Spinal cord stimulator status 12/11/2019   Elevated TSH 08/20/2019   CKD (chronic kidney disease) stage 3, GFR 30-59 ml/min (HCC) 01/19/2019   Acquired thrombophilia 01/19/2019   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   Postlaminectomy syndrome, lumbar region 01/16/2019   History of fusion of lumbar spine (L2-L5) 01/16/2019   Chronic radicular lumbar pain 01/16/2019   HNP (herniated nucleus pulposus), lumbar 04/29/2018   Advanced care planning/counseling discussion 09/28/2016   Spinal stenosis, lumbar region, 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/13/2016   Hyperlipidemia associated with type 2 diabetes mellitus (HCC) 07/14/2015   Symptomatic anemia 06/30/2015   Benign prostatic hyperplasia without lower urinary tract  symptoms 06/02/2015   OSA (obstructive sleep apnea) 03/23/2015   Hypertension associated with diabetes (HCC) 09/28/2014   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HCC) 09/28/2014   H/O prior ablation treatment 10/19/2011   AF (paroxysmal atrial fibrillation) (HCC) 10/19/2011   PCP: Valerio Melanie DASEN, NP  REFERRING PROVIDER: Maree Jannett POUR, MD   REFERRING DIAG: Peripheral Neuropathy  RATIONALE FOR EVALUATION AND TREATMENT: Rehabilitation  THERAPY DIAG: Muscle weakness (generalized)  Other low back pain  ONSET DATE: 01/25/23 (acute on chronic)  FOLLOW-UP APPT SCHEDULED WITH REFERRING PROVIDER: Yes   FROM INITIAL EVALUATION SUBJECTIVE:  SUBJECTIVE STATEMENT:  Imbalance and LE weakness;  PERTINENT HISTORY:  Lower extremity weakness in patient with 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 He reports weakness in both legs and numbness in toes post surgery in 2018. He experienced a fall on 01/25/2023 at home, resulting in a L hip injury. No residual pain in hip at this time. No additional falls since December. He reports progressive LE weakness impairing his functional ability at home. He is known to this clinic from prior episodes of care for similar concerns.  Prior history: 11/02/22 Patient reports to physical therapy today with a chief concern of muscle weakness and imbalance. Patient reports progressive worsening in balance and muscular strength. He states that he has loss of sensation in  his toes he reports they are asleep. Patient reports pain in the anterior thigh and bilateral calves. He currently uses an loftstand crutch in his RUE. Lately patient has reported difficulty with dressing LE, performing fine motor movements with left hand. He denies falls, saddle parasthesia, nausea, vomitting, night sweats.    10/17/2022 Patient reported he is not doing well lately. He has been seeing Dr. Clois. He reports his legs are so weak that he can hardly walk, so he has been using a crutch for the last 1-2 years. Reported numbness and loss of sensation in his toes. He also reported that recently his left hand has began to has some numbness to where it feels like it is asleep. Denies pain, loss of sensation, coldness, burning. Denies falls.  Imaging: NCS conducted on 11/12/2018: Abnormal study. 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 There is also preliminary evidence of a superimposed left lower lumbosacral polyradiculopathy, based on NCV and distal needle exam results. I cannot test lumbar paraspinals due to patient on Eliquis .    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 Yes. 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  Recent memory is intact.  Remote memory is intact.  Attention span and concentration are intact.  Expressive speech is intact.  Patient's fund of knowledge is  within normal limits for educational level.    Gross Musculoskeletal Assessment Tremor: None Bulk: Normal Tone: Normal  Posture: Forward head and rounded shoulders  AROM Deferred specific measurements. Functional motion intact;  LE MMT: MMT (out of 5) Right  Left   Hip flexion 4 4+  Hip extension    Hip abduction (seated) 3+ 3+  Hip adduction (seated 3+ 3+  Hip internal rotation    Hip external rotation    Knee flexion (seated) 4 4  Knee extension 4 4  Ankle dorsiflexion 4 4  Ankle plantarflexion    Ankle inversion    Ankle eversion    (* = pain; Blank rows = not tested)  Transfers: Assistive device utilized: Single Lofstrand RUE  Sit to stand: Modified independence Stand to sit: Modified independence Chair to chair: Modified independence Floor: Deferred  Stairs: Level of Assistance: CGA Stair Negotiation Technique: Alternating Pattern  with Bilateral Rails Number of Stairs: 4  Height of Stairs: 6  Comments: Slow and labored ascending/descending. No overt LOB;  Gait: Gait pattern: decreased step length- Right, decreased step length- Left, trunk flexed, poor foot clearance- Right, and poor foot clearance- Left Distance walked: 150' over the course of  evaluation Assistive device utilized: Single Lofstrand in RUE Level of assistance: Modified independence Comments: Slow and labored ambulation. Decreased self selected speed  Functional Outcome Measures  Results Comments  BERG 40/56 Increased fall risk  DGI    FGA    TUG 22.2 seconds Increased fall risk  5TSTS Unable Increased fall risk  6 Minute Walk Test    10 Meter Gait Speed Self-selected: 20.5s = 0.49 m/s; Fastest: 18.0s = 0.56 m/s Below community ambulation speed  (Blank rows = not tested)   TODAY'S TREATMENT: 12/20/23    SUBJECTIVE: Patient reports ongoing R shin pain sensitive to touch and mild LBP at start of tx session. No falls or stumbles reported since last session. Pt has no questions upon arrival.    PAIN: 4/10 chronic low back pain. Sharp R shin pain to light touch.    Therapeutic Activity NuStep L1-4 x 10 minutes, for BLE strengthening during interval history; ABC: 39.4%  Functional Outcome Measures  Results Comments  BERG 52/56 Mild balance deficits  DGI    FGA    TUG 13.6 seconds no crutch 16.2 seconds with crutch Increased fall risk  5TSTS 13.9 seconds WNL  6 Minute Walk Test    10 Meter Gait Speed Self-selected: 20.2s = 0.5 m/s; Fastest: 16s = 0.63 m/s Below community ambulation speed (0.8 m/s)   Seated clams with manual resistance from therapist 1 x 20; Seated adductor squeezes with manual resistance from therapist 1 x 20;   Not performed this session: Hip flexion marches 2 x 20 BLE; Standing on AE pad, Alternating Basketball chest press and bounce pass with PT, 2 x 3' Mini squats 2 x 20; SLS hip 3 way cone taps 2 x 8 ea leg; Cross-over stepping in // bars without UE support x multiple lengths; Nautilus resisted gait 60# R lateral and L lateral x 3 each, repeated standing rest breaks due to fatigue, pt performs slowly; Forward and lateral 6 step-ups alternating leading LE x 10 each; Fwd step over 6 hurdles in // bars x 3 laps;  Sit to stand  with 6 # ball overhead press 2 x 10; Seated LAQ with 3# ankle weights 2 x 20 BLE; Standing hip strengthening with 3# ankle weights (AW): Hip abduction x 20 BLE; Hip HS curl  2 x 20 BLE;  Hip extension x 20 BLE;  Standing heel raises 2 x 20 BLE;    PATIENT EDUCATION:  Education details: Pt educated throughout session about proper posture and technique with exercises. Improved exercise technique, movement at target joints, use of target muscles after min to mod verbal, visual, tactile cues.  Person educated: Patient Education method: Explanation, Verbal cues, and Handouts Education comprehension: verbalized understanding and returned demonstration   HOME EXERCISE PROGRAM:  Access Code: [email or web address] URL: [email or web address] Date: 05/31/2023 Prepared by: Selinda Eck  Exercises - Seated Long Arc Quad  - 1 x daily - 3-4 x weekly - 3 sets - 10 reps - 2s hold - Seated Hip Abduction with Resistance  - 1 x daily - 3-4 x weekly - 3 sets - 10 reps - 2s hold - Heel Raises with Counter Support  - 1 x daily - 3-4 x weekly - 3 sets - 10 reps - 2s hold - Mini Squat with Counter Support  - 1 x daily - 3-4 x weekly - 3 sets - 10 reps - Semi-Tandem Balance at [employer] Eyes Open  - 1 x daily - 7 x weekly - 2-3 sets - 10 reps - 10-30 hold   ASSESSMENT:  CLINICAL IMPRESSION:   Updated outcome measures/goals with patient today. He has made significant progress since his initial evaluation. His BERG balance score improved from 40/56 at the initial evaluation to 52/56 today which is unchanged from the last update. His balance confidence has increased from 20% initially to 39.4% today. However, he still has considerable room for improvement in his balance confidence. He has demonstrated maintenance of his BLE strength evidenced by his 5TSTS, TUG, and self-selected 48m gait speed. His self-selected and fast gait speeds are both still below the required speed for safe community ambulation. No HEP  modifications at this time. Pt encouraged to follow-up as scheduled. He will benefit from PT services to address deficits in strength, balance, and mobility in order to return to full function at home and decrease his risk for falls.    OBJECTIVE IMPAIRMENTS: Abnormal gait, decreased balance, difficulty walking, decreased strength, impaired sensation, and pain.   ACTIVITY LIMITATIONS: bending, standing, squatting, stairs, transfers, and caring for others  PARTICIPATION LIMITATIONS: meal prep, cleaning, laundry, shopping, and community activity  PERSONAL FACTORS: Age, Past/current experiences, Time since onset of injury/illness/exacerbation, and 3+ comorbidities: generalized polyneuropathy, COPD, DM, NSTEMI, and spinal stenosis are also affecting patient's functional outcome.   REHAB POTENTIAL: Fair    CLINICAL DECISION MAKING: Unstable/unpredictable  EVALUATION COMPLEXITY: High   GOALS: Goals reviewed with patient? No  SHORT TERM GOALS: Target date: 07/10/2023  Pt will be independent with HEP in order to improve strength and balance in order to decrease fall risk and improve function at home. Baseline:  Goal status: MET   LONG TERM GOALS: Target date: 11/15/2023  Pt will improve ABC by at least 13% in order to demonstrate clinically significant improvement in balance confidence.  Baseline: 20%; 07/10/23: 30%, 08/13/23: 36.25%; 12/20/23: 39.4% Goal status: ACHIEVED  2.  Pt will improve BERG by at least 3 points in order to demonstrate clinically significant improvement in balance.   Baseline: 40/56; 07/10/23: 50/56; 08/13/23: 52/56; 12/20/23: 52/56; Goal status: ACHIEVED  3.  Pt will increase self-selected to at least 0.6 m/s in order to demonstrate clinically significant improvement in limited community ambulation.         Baseline: self-selected: 0.49 m/s; 07/10/23: 0.52 m/s; 08/13/23: 0.49 m/s;  12/20/23: 0.50 m/s; Goal status: PARTIALLY MET;  4. Pt will be able to complete 5TSTS  without UE support in order to demonstrate clinically significant improvement in LE strength. Baseline: Unable to perform sit to stand without heavy UE assist; 07/10/23: 11.4s; 08/13/23: 12.72 s; 12/20/23: 13.9s; Goal status: ACHIEVED  5. Pt will decrease TUG to below 14 seconds/decrease in order to demonstrate decreased fall risk.  Baseline: 22.2s; 07/10/23: 14.0s; 08/13/23: 24.53s no crutch, 16.20s with crutch; 12/20/23: 13.6s no crutch, 16.2s with crutch Goal status: ONGOING  PLAN: PT FREQUENCY: 2x/week  PT DURATION: 12 weeks  PLANNED INTERVENTIONS: Therapeutic exercises, Therapeutic activity, Neuromuscular re-education, Balance training, Gait training, Patient/Family education, Self Care, Joint mobilization, Joint manipulation, Vestibular training, Canalith repositioning, Orthotic/Fit training, DME instructions, Dry Needling, Electrical stimulation, Spinal manipulation, Spinal mobilization, Cryotherapy, Moist heat, Taping, Traction, Ultrasound, Ionotophoresis 4mg /ml Dexamethasone , Manual therapy, and Re-evaluation.  PLAN FOR NEXT SESSION: progress strengthening exercises, walking/ambulation speed and endurance tasks, modify/progress HEP as needed;   Marysol Wellnitz D Misha Vanoverbeke PT, DPT, GCS  2:09 PM,12/21/23

## 2023-12-18 NOTE — Therapy (Signed)
 OUTPATIENT PHYSICAL THERAPY BALANCE TREATMENT NOTE   Patient Name: Sean Reyes. MRN: [id number] DOB:1940-05-29, 83 y.o., male Today's Date: 12/18/2023  END OF SESSION:  PT End of Session - 12/18/23 1351     Visit Number 29    Number of Visits 49    Date for Recertification  11/15/23    Authorization Type eval: 05/29/23;    Authorization Time Period Medicare A&B 2025  VL: Based on [place]  No auth req    PT Start Time 1356    PT Stop Time 1441    PT Time Calculation (min) 45 min    Equipment Utilized During Treatment Gait belt    Activity Tolerance Patient tolerated treatment well;Patient limited by fatigue;Patient limited by pain    Behavior During Therapy WFL for tasks assessed/performed          Past Medical History:  Diagnosis Date   Anemia    Anxiety    Arthritis    Arthritis of neck    Atrial fibrillation (HCC)    Cataracts, bilateral    Complication of anesthesia    pt reports low BP's after surgery at [facility] and difficulty awakening   Depression    Diabetes (HCC)    dx 6-8 yrs ago   Dysrhythmia    a-fib   GERD (gastroesophageal reflux disease)    OCC TAKES ALKA SELTZER   History of kidney stones    10-15 yrs ago   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   Hyperlipidemia    Hypertension    Myocardial infarction (HCC) 12/2020   Nocturia    S/P ablation of atrial fibrillation    Ablative therapy   Sleep apnea    CPAP    Spinal stenosis    Tachycardia, unspecified    Past Surgical History:  Procedure Laterality Date   ABLATION     ANTERIOR LAT LUMBAR FUSION N/A 06/27/2017   Procedure: Anterior Lateral Lumbar Interbody  Fusion - Lumbar Two-Lumbar Three - Lumbar Three-Lumbar Four, Posterior Lumbar Interbody Fusion Lumbar Four- Five;  Surgeon: Onetha Kuba, MD;  Location: [facility] OR;  Service: Neurosurgery;  Laterality: N/A;  Anterior Lateral Lumbar Interbody  Fusion - Lumbar Two-Lumbar Three - Lumbar Three-Lumbar Four, Posterior Lumbar Interbody  Fusion Lumbar Four- Five   BACK SURGERY     CARDIAC CATHETERIZATION     CARDIOVERSION N/A 08/29/2018   Procedure: CARDIOVERSION;  Surgeon: Hester Wolm PARAS, MD;  Location: [facility] ORS;  Service: Cardiovascular;  Laterality: N/A;   CARDIOVERSION N/A 09/24/2018   Procedure: CARDIOVERSION;  Surgeon: Hester Wolm PARAS, MD;  Location: [facility] ORS;  Service: Cardiovascular;  Laterality: N/A;   COLONOSCOPY WITH PROPOFOL  N/A 10/05/2015   Procedure: COLONOSCOPY WITH PROPOFOL ;  Surgeon: Gladis RAYMOND Mariner, MD;  Location: [facility] ENDOSCOPY;  Service: Endoscopy;  Laterality: N/A;   COLONOSCOPY WITH PROPOFOL  N/A 11/01/2020   Procedure: COLONOSCOPY WITH PROPOFOL ;  Surgeon: Therisa Bi, MD;  Location: [facility] ENDOSCOPY;  Service: Gastroenterology;  Laterality: N/A;   CORONARY STENT INTERVENTION N/A 12/22/2020   Procedure: CORONARY STENT INTERVENTION;  Surgeon: Ammon Blunt, MD;  Location: [facility] INVASIVE CV LAB;  Service: Cardiovascular;  Laterality: N/A;   ESOPHAGOGASTRODUODENOSCOPY N/A 11/01/2020   Procedure: ESOPHAGOGASTRODUODENOSCOPY (EGD);  Surgeon: Therisa Bi, MD;  Location: [facility] ENDOSCOPY;  Service: Gastroenterology;  Laterality: N/A;   ESOPHAGOGASTRODUODENOSCOPY (EGD) WITH PROPOFOL  N/A 04/01/2018   Procedure: ESOPHAGOGASTRODUODENOSCOPY (EGD) WITH PROPOFOL ;  Surgeon: Mariner Gladis RAYMOND, MD;  Location: [facility] ENDOSCOPY;  Service: Endoscopy;  Laterality: N/A;  ESOPHAGOGASTRODUODENOSCOPY (EGD) WITH PROPOFOL  N/A 06/01/2022   Procedure: ESOPHAGOGASTRODUODENOSCOPY (EGD) WITH PROPOFOL ;  Surgeon: Therisa Bi, MD;  Location: [facility] ENDOSCOPY;  Service: Gastroenterology;  Laterality: N/A;   EYE SURGERY     HERNIA REPAIR     JOINT REPLACEMENT Bilateral    hips  RT+  LEFT X2    LEFT HEART CATH AND CORONARY ANGIOGRAPHY N/A 12/22/2020   Procedure: LEFT HEART CATH AND CORONARY ANGIOGRAPHY;  Surgeon: Ammon Blunt, MD;  Location: [facility] INVASIVE CV LAB;  Service: Cardiovascular;  Laterality: N/A;   LEFT HEART CATH AND  CORONARY ANGIOGRAPHY Left 08/23/2021   Procedure: LEFT HEART CATH AND CORONARY ANGIOGRAPHY;  Surgeon: Hester Wolm PARAS, MD;  Location: [facility] INVASIVE CV LAB;  Service: Cardiovascular;  Laterality: Left;   LUMBAR LAMINECTOMY/DECOMPRESSION MICRODISCECTOMY Left 09/13/2016   Procedure: Microdiscectomy - Lumbar two-three,  Lumbar three- - left;  Surgeon: Onetha Kuba, MD;  Location: [facility] OR;  Service: Neurosurgery;  Laterality: Left;   SPINAL CORD STIMULATOR INSERTION  07/08/2019   TONSILLECTOMY     Patient Active Problem List   Diagnosis Date Noted   Diabetes mellitus treated with oral medication (HCC) 04/05/2023   Subareolar mass of left breast 10/03/2022   COPD (chronic obstructive pulmonary disease) (HCC) 10/31/2021   Shortness of breath on exertion 08/04/2021   Diabetes mellitus with proteinuria (HCC) 04/04/2021   GERD without esophagitis 04/02/2021   Coronary artery disease 12/28/2020   History of non-ST elevation myocardial infarction (NSTEMI) 12/21/2020   Pain in right shin 10/25/2020   Peripheral vascular disease 08/06/2020   Chronic pain syndrome 07/06/2020   Cervical facet joint syndrome 04/08/2020   Spinal cord stimulator status 12/11/2019   Elevated TSH 08/20/2019   CKD (chronic kidney disease) stage 3, GFR 30-59 ml/min (HCC) 01/19/2019   Acquired thrombophilia 01/19/2019   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   Postlaminectomy syndrome, lumbar region 01/16/2019   History of fusion of lumbar spine (L2-L5) 01/16/2019   Chronic radicular lumbar pain 01/16/2019   HNP (herniated nucleus pulposus), lumbar 04/29/2018   Advanced care planning/counseling discussion 09/28/2016   Spinal stenosis, lumbar region, 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/13/2016   Hyperlipidemia associated with type 2 diabetes mellitus (HCC) 07/14/2015   Symptomatic anemia 06/30/2015   Benign prostatic hyperplasia without lower urinary tract symptoms 06/02/2015   OSA (obstructive sleep apnea) 03/23/2015    Hypertension associated with diabetes (HCC) 09/28/2014   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HCC) 09/28/2014   H/O prior ablation treatment 10/19/2011   AF (paroxysmal atrial fibrillation) (HCC) 10/19/2011   PCP: Valerio Melanie DASEN, NP  REFERRING PROVIDER: Maree Jannett POUR, MD   REFERRING DIAG: Peripheral Neuropathy  RATIONALE FOR EVALUATION AND TREATMENT: Rehabilitation  THERAPY DIAG: Muscle weakness (generalized)  Other low back pain  Difficulty in walking, not elsewhere classified  ONSET DATE: 01/25/23 (acute on chronic)  FOLLOW-UP APPT SCHEDULED WITH REFERRING PROVIDER: Yes   FROM INITIAL EVALUATION SUBJECTIVE:  SUBJECTIVE STATEMENT:  Imbalance and LE weakness;  PERTINENT HISTORY:  Lower extremity weakness in patient with 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 He reports weakness in both legs and numbness in toes post surgery in 2018. He experienced a fall on 01/25/2023 at home, resulting in a L hip injury. No residual pain in hip at this time. No additional falls since December. He reports progressive LE weakness impairing his functional ability at home. He is known to this clinic from prior episodes of care for similar concerns.  Prior history: 11/02/22 Patient reports to physical therapy today with a chief concern of muscle weakness and imbalance. Patient reports progressive worsening in balance and muscular strength. He states that he has loss of sensation in his toes he  reports they are asleep. Patient reports pain in the anterior thigh and bilateral calves. He currently uses an loftstand crutch in his RUE. Lately patient has reported difficulty with dressing LE, performing fine motor movements with left hand. He denies falls, saddle parasthesia, nausea, vomitting, night sweats.    10/17/2022 Patient reported he is not doing well lately. He has been seeing Dr. Clois. He reports his legs are so weak that he can hardly walk, so he has been using a crutch for the last 1-2 years. Reported numbness and loss of sensation in his toes. He also reported that recently his left hand has began to has some numbness to where it feels like it is asleep. Denies pain, loss of sensation, coldness, burning. Denies falls.  Imaging: NCS conducted on 11/12/2018: Abnormal study. 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 There is also preliminary evidence of a superimposed left lower lumbosacral polyradiculopathy, based on NCV and distal needle exam results. I cannot test lumbar paraspinals due to patient on Eliquis .    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 Yes. 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  Recent memory is intact.  Remote memory is intact.  Attention span and concentration are intact.  Expressive speech is intact.  Patient's fund of knowledge is  within normal limits for educational level.    Gross Musculoskeletal Assessment Tremor: None Bulk: Normal Tone: Normal  Posture: Forward head and rounded shoulders  AROM Deferred specific measurements. Functional motion intact;  LE MMT: MMT (out of 5) Right  Left   Hip flexion 4 4+  Hip extension    Hip abduction (seated) 3+ 3+  Hip adduction (seated 3+ 3+  Hip internal rotation    Hip external rotation    Knee flexion (seated) 4 4  Knee extension 4 4  Ankle dorsiflexion 4 4  Ankle plantarflexion    Ankle inversion    Ankle eversion    (* = pain; Blank rows = not tested)  Transfers: Assistive device utilized: Single Lofstrand RUE  Sit to stand: Modified independence Stand to sit: Modified independence Chair to chair: Modified independence Floor: Deferred  Stairs: Level of Assistance: CGA Stair Negotiation Technique: Alternating Pattern  with Bilateral Rails Number of Stairs: 4  Height of Stairs: 6  Comments: Slow and labored ascending/descending. No overt LOB;  Gait: Gait pattern: decreased step length- Right, decreased step length- Left, trunk flexed, poor foot clearance- Right, and poor foot clearance- Left Distance walked: 150' over the course of  evaluation Assistive device utilized: Single Lofstrand in RUE Level of assistance: Modified independence Comments: Slow and labored ambulation. Decreased self selected speed  Functional Outcome Measures  Results Comments  BERG 40/56 Increased fall risk  DGI    FGA    TUG 22.2 seconds Increased fall risk  5TSTS Unable Increased fall risk  6 Minute Walk Test    10 Meter Gait Speed Self-selected: 20.5s = 0.49 m/s; Fastest: 18.0s = 0.56 m/s Below community ambulation speed  (Blank rows = not tested)   TODAY'S TREATMENT: 12/18/2023    SUBJECTIVE: Patient reports ongoing R shin pain sensitive to touch and mild LBP at start of tx session. No falls or stumbles reported since last session. Pt has no questions upon arrival. Pt states that he has been doing a lot of housework and chores at home over the past week or so which has increased his LBP and general LE soreness.    PAIN: 3/10 chronic low back pain. Sharp R shin pain to light touch.    Therapeutic Activity NuStep L1-4 x 10 minutes, for BLE strengthening and warm-up during interval history (5 mins unbilled); Forward and lateral 6 step-ups alternating leading LE x 10 each; Fwd step over 6 hurdles in // bars x 3 laps;  Sit to stand with 6 # ball overhead press 2 x 10; Seated LAQ with 3# ankle weights 2 x 20 BLE; Standing hip strengthening with 3# ankle weights (AW): Hip abduction x 20 BLE; Hip HS curl 2 x 20 BLE;  Hip extension x 20 BLE;  Standing heel raises 2 x 20 BLE;  Seated clams with manual resistance from therapist 1 x 20; Seated adductor squeezes with manual resistance from therapist 1 x 20;  Not performed this session: Hip flexion marches 2 x 20 BLE; Standing on AE pad, Alternating Basketball chest press and bounce pass with PT, 2 x 3' Mini squats 2 x 20; SLS hip 3 way cone taps 2 x 8 ea leg; Cross-over stepping in // bars without UE support x multiple lengths; Nautilus resisted gait 60# R lateral and L lateral x 3  each, repeated standing rest breaks due to fatigue, pt performs slowly;   PATIENT EDUCATION:  Education details: Pt educated throughout session about proper posture and technique with exercises.  Improved exercise technique, movement at target joints, use of target muscles after min to mod verbal, visual, tactile cues.  Person educated: Patient Education method: Explanation, Verbal cues, and Handouts Education comprehension: verbalized understanding and returned demonstration   HOME EXERCISE PROGRAM:  Access Code: [email or web address] URL: [email or web address] Date: 05/31/2023 Prepared by: Selinda Eck  Exercises - Seated Long Arc Quad  - 1 x daily - 3-4 x weekly - 3 sets - 10 reps - 2s hold - Seated Hip Abduction with Resistance  - 1 x daily - 3-4 x weekly - 3 sets - 10 reps - 2s hold - Heel Raises with Counter Support  - 1 x daily - 3-4 x weekly - 3 sets - 10 reps - 2s hold - Mini Squat with Counter Support  - 1 x daily - 3-4 x weekly - 3 sets - 10 reps - Semi-Tandem Balance at [employer] Eyes Open  - 1 x daily - 7 x weekly - 2-3 sets - 10 reps - 10-30 hold   ASSESSMENT:  CLINICAL IMPRESSION:   Focus of today's tx session focused on BLE strengthening and patient remains motivated to participate in all interventions during tx session. Pt was introduced to lateral step over 6-inch hurdles which he was able to complete without significant difficulty. Pt required multiple standing and seated rest breaks to recover from gross LE fatigue and low back pain with prolonged standing activities. Pt continues to experience difficulty with sit to stands with overhead press of 6# medicine ball with several instances of loss of anterior momentum when attempting to stand resulting in posterior LOB back to seated position  but was still able to complete entire intended number of sets and reps. Pt will continue to benefit from PT services to address remaining deficits in strength, balance, and  mobility in order to improve function at home and decrease his risk for falls.     OBJECTIVE IMPAIRMENTS: Abnormal gait, decreased balance, difficulty walking, decreased strength, impaired sensation, and pain.   ACTIVITY LIMITATIONS: bending, standing, squatting, stairs, transfers, and caring for others  PARTICIPATION LIMITATIONS: meal prep, cleaning, laundry, shopping, and community activity  PERSONAL FACTORS: Age, Past/current experiences, Time since onset of injury/illness/exacerbation, and 3+ comorbidities: generalized polyneuropathy, COPD, DM, NSTEMI, and spinal stenosis are also affecting patient's functional outcome.   REHAB POTENTIAL: Fair    CLINICAL DECISION MAKING: Unstable/unpredictable  EVALUATION COMPLEXITY: High   GOALS: Goals reviewed with patient? No  SHORT TERM GOALS: Target date: 07/10/2023  Pt will be independent with HEP in order to improve strength and balance in order to decrease fall risk and improve function at home. Baseline:  Goal status: MET   LONG TERM GOALS: Target date: 11/15/2023  Pt will improve ABC by at least 13% in order to demonstrate clinically significant improvement in balance confidence.  Baseline: 20%; 07/10/23: 30%, 08/13/23: 36.25% Goal status: ACHIEVED  2.  Pt will improve BERG by at least 3 points in order to demonstrate clinically significant improvement in balance.   Baseline: 40/56; 07/10/23: 50/56; 08/13/23: 52/56; Goal status: ACHIEVED  3.  Pt will increase self-selected to at least 0.6 m/s in order to demonstrate clinically significant improvement in limited community ambulation.         Baseline: self-selected: 0.49 m/s; 07/10/23: 0.52 m/s; 08/13/23: 0.49 m/s; Goal status: NOT MET  4. Pt will be able to complete 5TSTS without UE support in order to demonstrate clinically significant improvement in LE strength. Baseline: Unable to perform sit to stand without  heavy UE assist; 07/10/23: 11.4s; 08/13/23: 12.72 s Goal status:  ACHIEVED  5. Pt will decrease TUG to below 14 seconds/decrease in order to demonstrate decreased fall risk.  Baseline: 22.2s; 07/10/23: 14.0s; 08/13/23: 24.53s no crutch, 16.20s with crutch Goal status: ONGOING  PLAN: PT FREQUENCY: 2x/week  PT DURATION: 12 weeks  PLANNED INTERVENTIONS: Therapeutic exercises, Therapeutic activity, Neuromuscular re-education, Balance training, Gait training, Patient/Family education, Self Care, Joint mobilization, Joint manipulation, Vestibular training, Canalith repositioning, Orthotic/Fit training, DME instructions, Dry Needling, Electrical stimulation, Spinal manipulation, Spinal mobilization, Cryotherapy, Moist heat, Taping, Traction, Ultrasound, Ionotophoresis 4mg /ml Dexamethasone , Manual therapy, and Re-evaluation.  PLAN FOR NEXT SESSION: progress strengthening exercises, walking/ambulation speed and endurance tasks, modify/progress HEP as needed;   Curtistine Bracket, SPT  Selinda BIRCH Huprich PT, DPT, GCS  5:35 PM,12/18/23

## 2023-12-20 ENCOUNTER — Ambulatory Visit

## 2023-12-20 DIAGNOSIS — R262 Difficulty in walking, not elsewhere classified: Secondary | ICD-10-CM | POA: Diagnosis not present

## 2023-12-20 DIAGNOSIS — M5459 Other low back pain: Secondary | ICD-10-CM | POA: Diagnosis not present

## 2023-12-20 DIAGNOSIS — R2681 Unsteadiness on feet: Secondary | ICD-10-CM | POA: Diagnosis not present

## 2023-12-20 DIAGNOSIS — M6281 Muscle weakness (generalized): Secondary | ICD-10-CM

## 2023-12-21 NOTE — Addendum Note (Signed)
 Addended by: ROYAL MAYO D on: 12/21/2023 02:12 PM   Modules accepted: Orders

## 2023-12-25 ENCOUNTER — Ambulatory Visit

## 2023-12-25 DIAGNOSIS — R2681 Unsteadiness on feet: Secondary | ICD-10-CM

## 2023-12-25 DIAGNOSIS — M6281 Muscle weakness (generalized): Secondary | ICD-10-CM | POA: Diagnosis not present

## 2023-12-25 DIAGNOSIS — M5459 Other low back pain: Secondary | ICD-10-CM | POA: Diagnosis not present

## 2023-12-25 DIAGNOSIS — R262 Difficulty in walking, not elsewhere classified: Secondary | ICD-10-CM

## 2023-12-25 NOTE — Therapy (Signed)
 OUTPATIENT PHYSICAL THERAPY BALANCE TREATMENT NOTE   Patient Name: Sean Reyes. MRN: [id number] DOB:15-Mar-1940, 83 y.o., male Today's Date: 12/25/2023  END OF SESSION:  PT End of Session - 12/25/23 1340     Visit Number 31    Number of Visits 73    Date for Recertification  01/26/24    Authorization Type eval: 05/29/23;    Authorization Time Period Medicare A&B 2025  VL: Based on [place]  No auth req    PT Start Time 1340    PT Stop Time 1425    PT Time Calculation (min) 45 min    Equipment Utilized During Treatment Gait belt    Activity Tolerance Patient tolerated treatment well;Patient limited by fatigue;Patient limited by pain    Behavior During Therapy WFL for tasks assessed/performed          Past Medical History:  Diagnosis Date   Anemia    Anxiety    Arthritis    Arthritis of neck    Atrial fibrillation (HCC)    Cataracts, bilateral    Complication of anesthesia    pt reports low BP's after surgery at [facility] and difficulty awakening   Depression    Diabetes (HCC)    dx 6-8 yrs ago   Dysrhythmia    a-fib   GERD (gastroesophageal reflux disease)    OCC TAKES ALKA SELTZER   History of kidney stones    10-15 yrs ago   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   Hyperlipidemia    Hypertension    Myocardial infarction (HCC) 12/2020   Nocturia    S/P ablation of atrial fibrillation    Ablative therapy   Sleep apnea    CPAP    Spinal stenosis    Tachycardia, unspecified    Past Surgical History:  Procedure Laterality Date   ABLATION     ANTERIOR LAT LUMBAR FUSION N/A 06/27/2017   Procedure: Anterior Lateral Lumbar Interbody  Fusion - Lumbar Two-Lumbar Three - Lumbar Three-Lumbar Four, Posterior Lumbar Interbody Fusion Lumbar Four- Five;  Surgeon: Onetha Kuba, MD;  Location: [facility] OR;  Service: Neurosurgery;  Laterality: N/A;  Anterior Lateral Lumbar Interbody  Fusion - Lumbar Two-Lumbar Three - Lumbar Three-Lumbar Four, Posterior Lumbar Interbody  Fusion Lumbar Four- Five   BACK SURGERY     CARDIAC CATHETERIZATION     CARDIOVERSION N/A 08/29/2018   Procedure: CARDIOVERSION;  Surgeon: Hester Wolm PARAS, MD;  Location: [facility] ORS;  Service: Cardiovascular;  Laterality: N/A;   CARDIOVERSION N/A 09/24/2018   Procedure: CARDIOVERSION;  Surgeon: Hester Wolm PARAS, MD;  Location: [facility] ORS;  Service: Cardiovascular;  Laterality: N/A;   COLONOSCOPY WITH PROPOFOL  N/A 10/05/2015   Procedure: COLONOSCOPY WITH PROPOFOL ;  Surgeon: Gladis RAYMOND Mariner, MD;  Location: [facility] ENDOSCOPY;  Service: Endoscopy;  Laterality: N/A;   COLONOSCOPY WITH PROPOFOL  N/A 11/01/2020   Procedure: COLONOSCOPY WITH PROPOFOL ;  Surgeon: Therisa Bi, MD;  Location: [facility] ENDOSCOPY;  Service: Gastroenterology;  Laterality: N/A;   CORONARY STENT INTERVENTION N/A 12/22/2020   Procedure: CORONARY STENT INTERVENTION;  Surgeon: Ammon Blunt, MD;  Location: [facility] INVASIVE CV LAB;  Service: Cardiovascular;  Laterality: N/A;   ESOPHAGOGASTRODUODENOSCOPY N/A 11/01/2020   Procedure: ESOPHAGOGASTRODUODENOSCOPY (EGD);  Surgeon: Therisa Bi, MD;  Location: [facility] ENDOSCOPY;  Service: Gastroenterology;  Laterality: N/A;   ESOPHAGOGASTRODUODENOSCOPY (EGD) WITH PROPOFOL  N/A 04/01/2018   Procedure: ESOPHAGOGASTRODUODENOSCOPY (EGD) WITH PROPOFOL ;  Surgeon: Mariner Gladis RAYMOND, MD;  Location: [facility] ENDOSCOPY;  Service: Endoscopy;  Laterality: N/A;  ESOPHAGOGASTRODUODENOSCOPY (EGD) WITH PROPOFOL  N/A 06/01/2022   Procedure: ESOPHAGOGASTRODUODENOSCOPY (EGD) WITH PROPOFOL ;  Surgeon: Therisa Bi, MD;  Location: [facility] ENDOSCOPY;  Service: Gastroenterology;  Laterality: N/A;   EYE SURGERY     HERNIA REPAIR     JOINT REPLACEMENT Bilateral    hips  RT+  LEFT X2    LEFT HEART CATH AND CORONARY ANGIOGRAPHY N/A 12/22/2020   Procedure: LEFT HEART CATH AND CORONARY ANGIOGRAPHY;  Surgeon: Ammon Blunt, MD;  Location: [facility] INVASIVE CV LAB;  Service: Cardiovascular;  Laterality: N/A;   LEFT HEART CATH AND  CORONARY ANGIOGRAPHY Left 08/23/2021   Procedure: LEFT HEART CATH AND CORONARY ANGIOGRAPHY;  Surgeon: Hester Wolm PARAS, MD;  Location: [facility] INVASIVE CV LAB;  Service: Cardiovascular;  Laterality: Left;   LUMBAR LAMINECTOMY/DECOMPRESSION MICRODISCECTOMY Left 09/13/2016   Procedure: Microdiscectomy - Lumbar two-three,  Lumbar three- - left;  Surgeon: Onetha Kuba, MD;  Location: [facility] OR;  Service: Neurosurgery;  Laterality: Left;   SPINAL CORD STIMULATOR INSERTION  07/08/2019   TONSILLECTOMY     Patient Active Problem List   Diagnosis Date Noted   Diabetes mellitus treated with oral medication (HCC) 04/05/2023   Subareolar mass of left breast 10/03/2022   COPD (chronic obstructive pulmonary disease) (HCC) 10/31/2021   Shortness of breath on exertion 08/04/2021   Diabetes mellitus with proteinuria (HCC) 04/04/2021   GERD without esophagitis 04/02/2021   Coronary artery disease 12/28/2020   History of non-ST elevation myocardial infarction (NSTEMI) 12/21/2020   Pain in right shin 10/25/2020   Peripheral vascular disease 08/06/2020   Chronic pain syndrome 07/06/2020   Cervical facet joint syndrome 04/08/2020   Spinal cord stimulator status 12/11/2019   Elevated TSH 08/20/2019   CKD (chronic kidney disease) stage 3, GFR 30-59 ml/min (HCC) 01/19/2019   Acquired thrombophilia 01/19/2019   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   Postlaminectomy syndrome, lumbar region 01/16/2019   History of fusion of lumbar spine (L2-L5) 01/16/2019   Chronic radicular lumbar pain 01/16/2019   HNP (herniated nucleus pulposus), lumbar 04/29/2018   Advanced care planning/counseling discussion 09/28/2016   Spinal stenosis, lumbar region, 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/13/2016   Hyperlipidemia associated with type 2 diabetes mellitus (HCC) 07/14/2015   Symptomatic anemia 06/30/2015   Benign prostatic hyperplasia without lower urinary tract symptoms 06/02/2015   OSA (obstructive sleep apnea) 03/23/2015    Hypertension associated with diabetes (HCC) 09/28/2014   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HCC) 09/28/2014   H/O prior ablation treatment 10/19/2011   AF (paroxysmal atrial fibrillation) (HCC) 10/19/2011   PCP: Valerio Melanie DASEN, NP  REFERRING PROVIDER: Maree Jannett POUR, MD   REFERRING DIAG: Peripheral Neuropathy  RATIONALE FOR EVALUATION AND TREATMENT: Rehabilitation  THERAPY DIAG: Muscle weakness (generalized)  Other low back pain  Difficulty in walking, not elsewhere classified  Unsteadiness on feet  ONSET DATE: 01/25/23 (acute on chronic)  FOLLOW-UP APPT SCHEDULED WITH REFERRING PROVIDER: Yes   FROM INITIAL EVALUATION SUBJECTIVE:  SUBJECTIVE STATEMENT:  Imbalance and LE weakness;  PERTINENT HISTORY:  Lower extremity weakness in patient with 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 He reports weakness in both legs and numbness in toes post surgery in 2018. He experienced a fall on 01/25/2023 at home, resulting in a L hip injury. No residual pain in hip at this time. No additional falls since December. He reports progressive LE weakness impairing his functional ability at home. He is known to this clinic from prior episodes of care for similar concerns.  Prior history: 11/02/22 Patient reports to physical therapy today with a chief concern of muscle weakness and imbalance. Patient reports progressive worsening in balance and muscular strength. He states that he has loss of  sensation in his toes he reports they are asleep. Patient reports pain in the anterior thigh and bilateral calves. He currently uses an loftstand crutch in his RUE. Lately patient has reported difficulty with dressing LE, performing fine motor movements with left hand. He denies falls, saddle parasthesia, nausea, vomitting, night sweats.    10/17/2022 Patient reported he is not doing well lately. He has been seeing Dr. Clois. He reports his legs are so weak that he can hardly walk, so he has been using a crutch for the last 1-2 years. Reported numbness and loss of sensation in his toes. He also reported that recently his left hand has began to has some numbness to where it feels like it is asleep. Denies pain, loss of sensation, coldness, burning. Denies falls.  Imaging: NCS conducted on 11/12/2018: Abnormal study. 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 There is also preliminary evidence of a superimposed left lower lumbosacral polyradiculopathy, based on NCV and distal needle exam results. I cannot test lumbar paraspinals due to patient on Eliquis .    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 Yes. 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  Recent memory is intact.  Remote memory is intact.  Attention span and concentration are intact.  Expressive speech is intact.  Patient's fund of knowledge is  within normal limits for educational level.    Gross Musculoskeletal Assessment Tremor: None Bulk: Normal Tone: Normal  Posture: Forward head and rounded shoulders  AROM Deferred specific measurements. Functional motion intact;  LE MMT: MMT (out of 5) Right  Left   Hip flexion 4 4+  Hip extension    Hip abduction (seated) 3+ 3+  Hip adduction (seated 3+ 3+  Hip internal rotation    Hip external rotation    Knee flexion (seated) 4 4  Knee extension 4 4  Ankle dorsiflexion 4 4  Ankle plantarflexion    Ankle inversion    Ankle eversion    (* = pain; Blank rows = not tested)  Transfers: Assistive device utilized: Single Lofstrand RUE  Sit to stand: Modified independence Stand to sit: Modified independence Chair to chair: Modified independence Floor: Deferred  Stairs: Level of Assistance: CGA Stair Negotiation Technique: Alternating Pattern  with Bilateral Rails Number of Stairs: 4  Height of Stairs: 6  Comments: Slow and labored ascending/descending. No overt LOB;  Gait: Gait pattern: decreased step length- Right, decreased step length- Left, trunk flexed, poor foot clearance- Right, and poor foot clearance- Left Distance walked:  150' over the course of evaluation Assistive device utilized: Single Lofstrand in RUE Level of assistance: Modified independence Comments: Slow and labored ambulation. Decreased self selected speed  Functional Outcome Measures  Results Comments  BERG 40/56 Increased fall risk  DGI    FGA    TUG 22.2 seconds Increased fall risk  5TSTS Unable Increased fall risk  6 Minute Walk Test    10 Meter Gait Speed Self-selected: 20.5s = 0.49 m/s; Fastest: 18.0s = 0.56 m/s Below community ambulation speed  (Blank rows = not tested)  ABC: 39.4%  Functional Outcome Measures  Results Comments  BERG 52/56 Mild balance deficits  DGI    FGA    TUG 13.6 seconds no crutch 16.2 seconds with crutch Increased fall risk  5TSTS 13.9 seconds WNL  6 Minute Walk Test    10 Meter Gait Speed Self-selected: 20.2s = 0.5 m/s; Fastest: 16s = 0.63 m/s Below community ambulation speed (0.8 m/s)    TODAY'S TREATMENT: 12/25/2023     SUBJECTIVE: Patient reports ongoing R shin pain sensitive to touch and mild LBP at start of tx session. No falls or stumbles reported since last session. Pt has no questions upon arrival but does report ongoing bilateral hamstring soreness that began at some point over the weekend.    PAIN: mild, chronic low back pain. Sharp R shin pain to light touch.    Therapeutic Activity NuStep L1-4 x 10 minutes, for BLE strengthening during interval history; Sit to stand with 6 # ball overhead press 2 x 10; Fwd step over 6 and 12 hurdles in // bars x 3 laps;  Forward and lateral 6 step-ups alternating leading LE x 10 each;   Therapeutic Exercise Seated clams with manual resistance from therapist 1 x 20; Seated adductor squeezes with manual resistance from therapist 1 x 20; Seated LAQ with 3# ankle weights 2 x 20 BLE; Standing hip strengthening with 3# ankle weights (AW): Hip abduction x 20 BLE; Hip HS curl 2 x 20 BLE;  Hip extension x 20 BLE;  Standing heel raises 2 x 20 BLE;   Hip flexion marches 2 x 20 BLE;  Not performed this session: Standing on AE pad, Alternating Basketball chest press and bounce pass with PT, 2 x 3' Mini squats 2 x 20; SLS hip 3 way  cone taps 2 x 8 ea leg; Cross-over stepping in // bars without UE support x multiple lengths; Nautilus resisted gait 60# R lateral and L lateral x 3 each, repeated standing rest breaks due to fatigue, pt performs slowly;   PATIENT EDUCATION:  Education details: Pt educated throughout session about proper posture and technique with exercises. Improved exercise technique, movement at target joints, use of target muscles after min to mod verbal, visual, tactile cues.  Person educated: Patient Education method: Explanation, Verbal cues, and Handouts Education comprehension: verbalized understanding and returned demonstration   HOME EXERCISE PROGRAM:  Access Code: [email or web address] URL: [email or web address] Date: 05/31/2023 Prepared by: Selinda Eck  Exercises - Seated Long Arc Quad  - 1 x daily - 3-4 x weekly - 3 sets - 10 reps - 2s hold - Seated Hip Abduction with Resistance  - 1 x daily - 3-4 x weekly - 3 sets - 10 reps - 2s hold - Heel Raises with Counter Support  - 1 x daily - 3-4 x weekly - 3 sets - 10 reps - 2s hold - Mini Squat with Counter Support  - 1 x daily - 3-4 x weekly - 3 sets - 10 reps - Semi-Tandem Balance at [employer] Eyes Open  - 1 x daily - 7 x weekly - 2-3 sets - 10 reps - 10-30 hold   ASSESSMENT:  CLINICAL IMPRESSION:   Pt was progressed to stepping over 6 and 12-inch hurdles on this day which he tolerated well. Pt was noted to experience increased difficulty with stepping over hurdles during SLS on LLE secondary to current deficits of left glute med strength and endurance capacity. Pt was also noted to experience difficulty with and increase in left hip pain during standing left hip abduction exercise but pain had eased off after completing initial set. Pt reported  significant, systemic fatigue at end of tx session but no increase in low back pain. Pt will continue to benefit from PT services to address remaining deficits in strength, balance, and mobility in order to return to full function at home and decrease his risk for falls.    OBJECTIVE IMPAIRMENTS: Abnormal gait, decreased balance, difficulty walking, decreased strength, impaired sensation, and pain.   ACTIVITY LIMITATIONS: bending, standing, squatting, stairs, transfers, and caring for others  PARTICIPATION LIMITATIONS: meal prep, cleaning, laundry, shopping, and community activity  PERSONAL FACTORS: Age, Past/current experiences, Time since onset of injury/illness/exacerbation, and 3+ comorbidities: generalized polyneuropathy, COPD, DM, NSTEMI, and spinal stenosis are also affecting patient's functional outcome.   REHAB POTENTIAL: Fair    CLINICAL DECISION MAKING: Unstable/unpredictable  EVALUATION COMPLEXITY: High   GOALS: Goals reviewed with patient? No  SHORT TERM GOALS: Target date: 07/10/2023  Pt will be independent with HEP in order to improve strength and balance in order to decrease fall risk and improve function at home. Baseline:  Goal status: MET   LONG TERM GOALS: Target date: 11/15/2023  Pt will improve ABC by at least 13% in order to demonstrate clinically significant improvement in balance confidence.  Baseline: 20%; 07/10/23: 30%, 08/13/23: 36.25%; 12/20/23: 39.4% Goal status: ACHIEVED  2.  Pt will improve BERG by at least 3 points in order to demonstrate clinically significant improvement in balance.   Baseline: 40/56; 07/10/23: 50/56; 08/13/23: 52/56; 12/20/23: 52/56; Goal status: ACHIEVED  3.  Pt will increase self-selected to at least 0.6 m/s in order to demonstrate clinically significant improvement in limited community ambulation.         Baseline: self-selected:  0.49 m/s; 07/10/23: 0.52 m/s; 08/13/23: 0.49 m/s; 12/20/23: 0.50 m/s; Goal status: PARTIALLY  MET;  4. Pt will be able to complete 5TSTS without UE support in order to demonstrate clinically significant improvement in LE strength. Baseline: Unable to perform sit to stand without heavy UE assist; 07/10/23: 11.4s; 08/13/23: 12.72 s; 12/20/23: 13.9s; Goal status: ACHIEVED  5. Pt will decrease TUG to below 14 seconds/decrease in order to demonstrate decreased fall risk.  Baseline: 22.2s; 07/10/23: 14.0s; 08/13/23: 24.53s no crutch, 16.20s with crutch; 12/20/23: 13.6s no crutch, 16.2s with crutch Goal status: ONGOING  PLAN: PT FREQUENCY: 2x/week  PT DURATION: 12 weeks  PLANNED INTERVENTIONS: Therapeutic exercises, Therapeutic activity, Neuromuscular re-education, Balance training, Gait training, Patient/Family education, Self Care, Joint mobilization, Joint manipulation, Vestibular training, Canalith repositioning, Orthotic/Fit training, DME instructions, Dry Needling, Electrical stimulation, Spinal manipulation, Spinal mobilization, Cryotherapy, Moist heat, Taping, Traction, Ultrasound, Ionotophoresis 4mg /ml Dexamethasone , Manual therapy, and Re-evaluation.  PLAN FOR NEXT SESSION: progress strengthening exercises, walking/ambulation speed and endurance tasks, modify/progress HEP as needed;   Curtistine Bracket, SPT  Jason D Huprich PT, DPT, GCS  5:31 PM,12/25/23

## 2023-12-27 ENCOUNTER — Ambulatory Visit

## 2023-12-27 DIAGNOSIS — M5459 Other low back pain: Secondary | ICD-10-CM

## 2023-12-27 DIAGNOSIS — R262 Difficulty in walking, not elsewhere classified: Secondary | ICD-10-CM

## 2023-12-27 DIAGNOSIS — M6281 Muscle weakness (generalized): Secondary | ICD-10-CM | POA: Diagnosis not present

## 2023-12-27 DIAGNOSIS — R2681 Unsteadiness on feet: Secondary | ICD-10-CM

## 2023-12-27 NOTE — Therapy (Signed)
 OUTPATIENT PHYSICAL THERAPY BALANCE TREATMENT NOTE   Patient Name: Sean Reyes. MRN: [id number] DOB:[birth year], 83 y.o., male Today's Date: 12/27/2023  END OF SESSION:  PT End of Session - 12/27/23 1354     Visit Number 32    Number of Visits 73    Date for Recertification  01/26/24    Authorization Type eval: 05/29/23;    Authorization Time Period Medicare A&B 2025  VL: Based on [place]  No auth req    PT Start Time 1357    PT Stop Time 1438    PT Time Calculation (min) 41 min    Equipment Utilized During Treatment Gait belt    Activity Tolerance Patient tolerated treatment well;Patient limited by fatigue;Patient limited by pain    Behavior During Therapy WFL for tasks assessed/performed           Past Medical History:  Diagnosis Date   Anemia    Anxiety    Arthritis    Arthritis of neck    Atrial fibrillation (HCC)    Cataracts, bilateral    Complication of anesthesia    pt reports low BP's after surgery at [facility] and difficulty awakening   Depression    Diabetes (HCC)    dx 6-8 yrs ago   Dysrhythmia    a-fib   GERD (gastroesophageal reflux disease)    OCC TAKES ALKA SELTZER   History of kidney stones    10-15 yrs ago   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   Hyperlipidemia    Hypertension    Myocardial infarction (HCC) 12/2020   Nocturia    S/P ablation of atrial fibrillation    Ablative therapy   Sleep apnea    CPAP    Spinal stenosis    Tachycardia, unspecified    Past Surgical History:  Procedure Laterality Date   ABLATION     ANTERIOR LAT LUMBAR FUSION N/A 06/27/2017   Procedure: Anterior Lateral Lumbar Interbody  Fusion - Lumbar Two-Lumbar Three - Lumbar Three-Lumbar Four, Posterior Lumbar Interbody Fusion Lumbar Four- Five;  Surgeon: Onetha Kuba, MD;  Location: [facility] OR;  Service: Neurosurgery;  Laterality: N/A;  Anterior Lateral Lumbar Interbody  Fusion - Lumbar Two-Lumbar Three - Lumbar Three-Lumbar Four, Posterior Lumbar  Interbody Fusion Lumbar Four- Five   BACK SURGERY     CARDIAC CATHETERIZATION     CARDIOVERSION N/A 08/29/2018   Procedure: CARDIOVERSION;  Surgeon: Hester Wolm PARAS, MD;  Location: [facility] ORS;  Service: Cardiovascular;  Laterality: N/A;   CARDIOVERSION N/A 09/24/2018   Procedure: CARDIOVERSION;  Surgeon: Hester Wolm PARAS, MD;  Location: [facility] ORS;  Service: Cardiovascular;  Laterality: N/A;   COLONOSCOPY WITH PROPOFOL  N/A 10/05/2015   Procedure: COLONOSCOPY WITH PROPOFOL ;  Surgeon: Gladis RAYMOND Mariner, MD;  Location: [facility] ENDOSCOPY;  Service: Endoscopy;  Laterality: N/A;   COLONOSCOPY WITH PROPOFOL  N/A 11/01/2020   Procedure: COLONOSCOPY WITH PROPOFOL ;  Surgeon: Therisa Bi, MD;  Location: [facility] ENDOSCOPY;  Service: Gastroenterology;  Laterality: N/A;   CORONARY STENT INTERVENTION N/A 12/22/2020   Procedure: CORONARY STENT INTERVENTION;  Surgeon: Ammon Blunt, MD;  Location: [facility] INVASIVE CV LAB;  Service: Cardiovascular;  Laterality: N/A;   ESOPHAGOGASTRODUODENOSCOPY N/A 11/01/2020   Procedure: ESOPHAGOGASTRODUODENOSCOPY (EGD);  Surgeon: Therisa Bi, MD;  Location: [facility] ENDOSCOPY;  Service: Gastroenterology;  Laterality: N/A;   ESOPHAGOGASTRODUODENOSCOPY (EGD) WITH PROPOFOL  N/A 04/01/2018   Procedure: ESOPHAGOGASTRODUODENOSCOPY (EGD) WITH PROPOFOL ;  Surgeon: Mariner Gladis RAYMOND, MD;  Location: [facility] ENDOSCOPY;  Service: Endoscopy;  Laterality: N/A;  ESOPHAGOGASTRODUODENOSCOPY (EGD) WITH PROPOFOL  N/A 06/01/2022   Procedure: ESOPHAGOGASTRODUODENOSCOPY (EGD) WITH PROPOFOL ;  Surgeon: Therisa Bi, MD;  Location: [facility] ENDOSCOPY;  Service: Gastroenterology;  Laterality: N/A;   EYE SURGERY     HERNIA REPAIR     JOINT REPLACEMENT Bilateral    hips  RT+  LEFT X2    LEFT HEART CATH AND CORONARY ANGIOGRAPHY N/A 12/22/2020   Procedure: LEFT HEART CATH AND CORONARY ANGIOGRAPHY;  Surgeon: Ammon Blunt, MD;  Location: [facility] INVASIVE CV LAB;  Service: Cardiovascular;  Laterality: N/A;   LEFT HEART  CATH AND CORONARY ANGIOGRAPHY Left 08/23/2021   Procedure: LEFT HEART CATH AND CORONARY ANGIOGRAPHY;  Surgeon: Hester Wolm PARAS, MD;  Location: [facility] INVASIVE CV LAB;  Service: Cardiovascular;  Laterality: Left;   LUMBAR LAMINECTOMY/DECOMPRESSION MICRODISCECTOMY Left 09/13/2016   Procedure: Microdiscectomy - Lumbar two-three,  Lumbar three- - left;  Surgeon: Onetha Kuba, MD;  Location: [facility] OR;  Service: Neurosurgery;  Laterality: Left;   SPINAL CORD STIMULATOR INSERTION  07/08/2019   TONSILLECTOMY     Patient Active Problem List   Diagnosis Date Noted   Diabetes mellitus treated with oral medication (HCC) 04/05/2023   Subareolar mass of left breast 10/03/2022   COPD (chronic obstructive pulmonary disease) (HCC) 10/31/2021   Shortness of breath on exertion 08/04/2021   Diabetes mellitus with proteinuria (HCC) 04/04/2021   GERD without esophagitis 04/02/2021   Coronary artery disease 12/28/2020   History of non-ST elevation myocardial infarction (NSTEMI) 12/21/2020   Pain in right shin 10/25/2020   Peripheral vascular disease 08/06/2020   Chronic pain syndrome 07/06/2020   Cervical facet joint syndrome 04/08/2020   Spinal cord stimulator status 12/11/2019   Elevated TSH 08/20/2019   CKD (chronic kidney disease) stage 3, GFR 30-59 ml/min (HCC) 01/19/2019   Acquired thrombophilia 01/19/2019   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   Postlaminectomy syndrome, lumbar region 01/16/2019   History of fusion of lumbar spine (L2-L5) 01/16/2019   Chronic radicular lumbar pain 01/16/2019   HNP (herniated nucleus pulposus), lumbar 04/29/2018   Advanced care planning/counseling discussion 09/28/2016   Spinal stenosis, lumbar region, 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/13/2016   Hyperlipidemia associated with type 2 diabetes mellitus (HCC) 07/14/2015   Symptomatic anemia 06/30/2015   Benign prostatic hyperplasia without lower urinary tract symptoms 06/02/2015   OSA (obstructive sleep apnea) 03/23/2015    Hypertension associated with diabetes (HCC) 09/28/2014   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HCC) 09/28/2014   H/O prior ablation treatment 10/19/2011   AF (paroxysmal atrial fibrillation) (HCC) 10/19/2011   PCP: Valerio Melanie DASEN, NP  REFERRING PROVIDER: Maree Jannett POUR, MD   REFERRING DIAG: Peripheral Neuropathy  RATIONALE FOR EVALUATION AND TREATMENT: Rehabilitation  THERAPY DIAG: Muscle weakness (generalized)  Other low back pain  Unsteadiness on feet  Difficulty in walking, not elsewhere classified  ONSET DATE: 01/25/23 (acute on chronic)  FOLLOW-UP APPT SCHEDULED WITH REFERRING PROVIDER: Yes   FROM INITIAL EVALUATION SUBJECTIVE:  SUBJECTIVE STATEMENT:  Imbalance and LE weakness;  PERTINENT HISTORY:  Lower extremity weakness in patient with 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 He reports weakness in both legs and numbness in toes post surgery in 2018. He experienced a fall on 01/25/2023 at home, resulting in a L hip injury. No residual pain in hip at this time. No additional falls since December. He reports progressive LE weakness impairing his functional ability at home. He is known to this clinic from prior episodes of care for similar concerns.  Prior history: 11/02/22 Patient reports to physical therapy today with a chief concern of muscle weakness and imbalance. Patient reports progressive worsening in balance and muscular strength. He states that he has loss of  sensation in his toes he reports they are asleep. Patient reports pain in the anterior thigh and bilateral calves. He currently uses an loftstand crutch in his RUE. Lately patient has reported difficulty with dressing LE, performing fine motor movements with left hand. He denies falls, saddle parasthesia, nausea, vomitting, night sweats.    10/17/2022 Patient reported he is not doing well lately. He has been seeing Dr. Clois. He reports his legs are so weak that he can hardly walk, so he has been using a crutch for the last 1-2 years. Reported numbness and loss of sensation in his toes. He also reported that recently his left hand has began to has some numbness to where it feels like it is asleep. Denies pain, loss of sensation, coldness, burning. Denies falls.  Imaging: NCS conducted on 11/12/2018: Abnormal study. 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 There is also preliminary evidence of a superimposed left lower lumbosacral polyradiculopathy, based on NCV and distal needle exam results. I cannot test lumbar paraspinals due to patient on Eliquis .    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 Yes. 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  Recent memory is intact.  Remote memory is intact.  Attention span and concentration are intact.  Expressive speech is intact.  Patient's fund of knowledge is  within normal limits for educational level.    Gross Musculoskeletal Assessment Tremor: None Bulk: Normal Tone: Normal  Posture: Forward head and rounded shoulders  AROM Deferred specific measurements. Functional motion intact;  LE MMT: MMT (out of 5) Right  Left   Hip flexion 4 4+  Hip extension    Hip abduction (seated) 3+ 3+  Hip adduction (seated 3+ 3+  Hip internal rotation    Hip external rotation    Knee flexion (seated) 4 4  Knee extension 4 4  Ankle dorsiflexion 4 4  Ankle plantarflexion    Ankle inversion    Ankle eversion    (* = pain; Blank rows = not tested)  Transfers: Assistive device utilized: Single Lofstrand RUE  Sit to stand: Modified independence Stand to sit: Modified independence Chair to chair: Modified independence Floor: Deferred  Stairs: Level of Assistance: CGA Stair Negotiation Technique: Alternating Pattern  with Bilateral Rails Number of Stairs: 4  Height of Stairs: 6  Comments: Slow and labored ascending/descending. No overt LOB;  Gait: Gait pattern: decreased step length- Right, decreased step length- Left, trunk flexed, poor foot clearance- Right, and poor foot clearance- Left Distance walked:  150' over the course of evaluation Assistive device utilized: Single Lofstrand in RUE Level of assistance: Modified independence Comments: Slow and labored ambulation. Decreased self selected speed  Functional Outcome Measures  Results Comments  BERG 40/56 Increased fall risk  DGI    FGA    TUG 22.2 seconds Increased fall risk  5TSTS Unable Increased fall risk  6 Minute Walk Test    10 Meter Gait Speed Self-selected: 20.5s = 0.49 m/s; Fastest: 18.0s = 0.56 m/s Below community ambulation speed  (Blank rows = not tested)  ABC: 39.4%  Functional Outcome Measures  Results Comments  BERG 52/56 Mild balance deficits  DGI    FGA    TUG 13.6 seconds no crutch 16.2 seconds with crutch Increased fall risk  5TSTS 13.9 seconds WNL  6 Minute Walk Test    10 Meter Gait Speed Self-selected: 20.2s = 0.5 m/s; Fastest: 16s = 0.63 m/s Below community ambulation speed (0.8 m/s)    TODAY'S TREATMENT: 12/27/2023    SUBJECTIVE: Patient reports ongoing R shin pain sensitive to touch. No falls or stumbles reported since last session. Pt has no questions upon arrival but does report ongoing bilateral hamstring soreness that began at some point over the weekend.   PAIN: mild, chronic low back pain. Sharp R shin pain to light touch.   Therapeutic Activity NuStep L1-4 x 10 minutes, for BLE strengthening during interval history; Sit to stand with 6 # ball overhead press 2 x 10; Fwd step over 6 and 12 hurdles in // bars x 3 laps;  Forward 6 step-ups alternating leading LE x 10 each;  Therapeutic Exercise Seated LAQ with 3# ankle weights 2 x 20 BLE; Standing hip strengthening with 3# ankle weights (AW): Hip abduction x 20 BLE; Hip HS curl x 20 BLE;  Standing heel raises 2 x 20 BLE;  Hip flexion marches 2 x 20 BLE; Seated hip abduction with BTB around knees 2 x 20  Seated hip adduction ball squeeze x 20   Not performed this session: Standing on AE pad, Alternating Basketball chest press and  bounce pass with PT, 2 x 3' Mini squats 2 x 20; SLS hip 3 way cone taps 2 x 8 ea leg; Cross-over stepping in // bars without UE support x multiple lengths; Nautilus resisted gait 60# R  lateral and L lateral x 3 each, repeated standing rest breaks due to fatigue, pt performs slowly;   PATIENT EDUCATION:  Education details: Pt educated throughout session about proper posture and technique with exercises. Improved exercise technique, movement at target joints, use of target muscles after min to mod verbal, visual, tactile cues.  Person educated: Patient Education method: Explanation, Verbal cues, and Handouts Education comprehension: verbalized understanding and returned demonstration   HOME EXERCISE PROGRAM:  Access Code: [email or web address] URL: [email or web address] Date: 05/31/2023 Prepared by: Selinda Eck  Exercises - Seated Long Arc Quad  - 1 x daily - 3-4 x weekly - 3 sets - 10 reps - 2s hold - Seated Hip Abduction with Resistance  - 1 x daily - 3-4 x weekly - 3 sets - 10 reps - 2s hold - Heel Raises with Counter Support  - 1 x daily - 3-4 x weekly - 3 sets - 10 reps - 2s hold - Mini Squat with Counter Support  - 1 x daily - 3-4 x weekly - 3 sets - 10 reps - Semi-Tandem Balance at [employer] Eyes Open  - 1 x daily - 7 x weekly - 2-3 sets - 10 reps - 10-30 hold   ASSESSMENT:  CLINICAL IMPRESSION:    Continued practicing obstacle negotiation of increasing height with patient having difficulty with stepping over while single leg stance on LLE. Able to tolerate further strengthening exercises during session. Pt reported significant, systemic fatigue at end of tx session but no increase in low back pain. Pt will continue to benefit from PT services to address remaining deficits in strength, balance, and mobility in order to return to full function at home and decrease his risk for falls.    OBJECTIVE IMPAIRMENTS: Abnormal gait, decreased balance, difficulty walking, decreased  strength, impaired sensation, and pain.   ACTIVITY LIMITATIONS: bending, standing, squatting, stairs, transfers, and caring for others  PARTICIPATION LIMITATIONS: meal prep, cleaning, laundry, shopping, and community activity  PERSONAL FACTORS: Age, Past/current experiences, Time since onset of injury/illness/exacerbation, and 3+ comorbidities: generalized polyneuropathy, COPD, DM, NSTEMI, and spinal stenosis are also affecting patient's functional outcome.   REHAB POTENTIAL: Fair    CLINICAL DECISION MAKING: Unstable/unpredictable  EVALUATION COMPLEXITY: High   GOALS: Goals reviewed with patient? No  SHORT TERM GOALS: Target date: 07/10/2023  Pt will be independent with HEP in order to improve strength and balance in order to decrease fall risk and improve function at home. Baseline:  Goal status: MET   LONG TERM GOALS: Target date: 11/15/2023  Pt will improve ABC by at least 13% in order to demonstrate clinically significant improvement in balance confidence.  Baseline: 20%; 07/10/23: 30%, 08/13/23: 36.25%; 12/20/23: 39.4% Goal status: ACHIEVED  2.  Pt will improve BERG by at least 3 points in order to demonstrate clinically significant improvement in balance.   Baseline: 40/56; 07/10/23: 50/56; 08/13/23: 52/56; 12/20/23: 52/56; Goal status: ACHIEVED  3.  Pt will increase self-selected to at least 0.6 m/s in order to demonstrate clinically significant improvement in limited community ambulation.         Baseline: self-selected: 0.49 m/s; 07/10/23: 0.52 m/s; 08/13/23: 0.49 m/s; 12/20/23: 0.50 m/s; Goal status: PARTIALLY MET;  4. Pt will be able to complete 5TSTS without UE support in order to demonstrate clinically significant improvement in LE strength. Baseline: Unable to perform sit to stand without heavy UE assist; 07/10/23: 11.4s; 08/13/23: 12.72 s; 12/20/23: 13.9s; Goal status: ACHIEVED  5. Pt will decrease TUG to below 14  seconds/decrease in order to demonstrate decreased  fall risk.  Baseline: 22.2s; 07/10/23: 14.0s; 08/13/23: 24.53s no crutch, 16.20s with crutch; 12/20/23: 13.6s no crutch, 16.2s with crutch Goal status: ONGOING  PLAN: PT FREQUENCY: 2x/week  PT DURATION: 12 weeks  PLANNED INTERVENTIONS: Therapeutic exercises, Therapeutic activity, Neuromuscular re-education, Balance training, Gait training, Patient/Family education, Self Care, Joint mobilization, Joint manipulation, Vestibular training, Canalith repositioning, Orthotic/Fit training, DME instructions, Dry Needling, Electrical stimulation, Spinal manipulation, Spinal mobilization, Cryotherapy, Moist heat, Taping, Traction, Ultrasound, Ionotophoresis 4mg /ml Dexamethasone , Manual therapy, and Re-evaluation.  PLAN FOR NEXT SESSION: progress strengthening exercises, walking/ambulation speed and endurance tasks, modify/progress HEP as needed;   Maryanne Finder, PT, DPT Physical Therapist -   [facility] 1:55 PM,12/27/23

## 2024-01-01 ENCOUNTER — Ambulatory Visit

## 2024-01-01 DIAGNOSIS — M6281 Muscle weakness (generalized): Secondary | ICD-10-CM

## 2024-01-01 DIAGNOSIS — R2681 Unsteadiness on feet: Secondary | ICD-10-CM

## 2024-01-01 DIAGNOSIS — R262 Difficulty in walking, not elsewhere classified: Secondary | ICD-10-CM

## 2024-01-01 DIAGNOSIS — M5459 Other low back pain: Secondary | ICD-10-CM | POA: Diagnosis not present

## 2024-01-01 NOTE — Therapy (Unsigned)
 OUTPATIENT PHYSICAL THERAPY BALANCE TREATMENT NOTE   Patient Name: Sean Reyes. MRN: [id number] DOB:01-07-1941, 83 y.o., male Today's Date: 01/02/2024  END OF SESSION:  PT End of Session - 01/01/24 1355     Visit Number 33    Number of Visits 73    Date for Recertification  01/26/24    Authorization Type eval: 05/29/23;    Authorization Time Period Medicare A&B 2025  VL: Based on [place]  No auth req    PT Start Time 1346    PT Stop Time 1431    PT Time Calculation (min) 45 min    Equipment Utilized During Treatment Gait belt    Activity Tolerance Patient tolerated treatment well;Patient limited by fatigue;Patient limited by pain    Behavior During Therapy WFL for tasks assessed/performed         Past Medical History:  Diagnosis Date   Anemia    Anxiety    Arthritis    Arthritis of neck    Atrial fibrillation (HCC)    Cataracts, bilateral    Complication of anesthesia    pt reports low BP's after surgery at [facility] and difficulty awakening   Depression    Diabetes (HCC)    dx 6-8 yrs ago   Dysrhythmia    a-fib   GERD (gastroesophageal reflux disease)    OCC TAKES ALKA SELTZER   History of kidney stones    10-15 yrs ago   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   Hyperlipidemia    Hypertension    Myocardial infarction (HCC) 12/2020   Nocturia    S/P ablation of atrial fibrillation    Ablative therapy   Sleep apnea    CPAP    Spinal stenosis    Tachycardia, unspecified    Past Surgical History:  Procedure Laterality Date   ABLATION     ANTERIOR LAT LUMBAR FUSION N/A 06/27/2017   Procedure: Anterior Lateral Lumbar Interbody  Fusion - Lumbar Two-Lumbar Three - Lumbar Three-Lumbar Four, Posterior Lumbar Interbody Fusion Lumbar Four- Five;  Surgeon: Onetha Kuba, MD;  Location: [facility] OR;  Service: Neurosurgery;  Laterality: N/A;  Anterior Lateral Lumbar Interbody  Fusion - Lumbar Two-Lumbar Three - Lumbar Three-Lumbar Four, Posterior Lumbar Interbody  Fusion Lumbar Four- Five   BACK SURGERY     CARDIAC CATHETERIZATION     CARDIOVERSION N/A 08/29/2018   Procedure: CARDIOVERSION;  Surgeon: Hester Wolm PARAS, MD;  Location: [facility] ORS;  Service: Cardiovascular;  Laterality: N/A;   CARDIOVERSION N/A 09/24/2018   Procedure: CARDIOVERSION;  Surgeon: Hester Wolm PARAS, MD;  Location: [facility] ORS;  Service: Cardiovascular;  Laterality: N/A;   COLONOSCOPY WITH PROPOFOL  N/A 10/05/2015   Procedure: COLONOSCOPY WITH PROPOFOL ;  Surgeon: Gladis RAYMOND Mariner, MD;  Location: [facility] ENDOSCOPY;  Service: Endoscopy;  Laterality: N/A;   COLONOSCOPY WITH PROPOFOL  N/A 11/01/2020   Procedure: COLONOSCOPY WITH PROPOFOL ;  Surgeon: Therisa Bi, MD;  Location: [facility] ENDOSCOPY;  Service: Gastroenterology;  Laterality: N/A;   CORONARY STENT INTERVENTION N/A 12/22/2020   Procedure: CORONARY STENT INTERVENTION;  Surgeon: Ammon Blunt, MD;  Location: [facility] INVASIVE CV LAB;  Service: Cardiovascular;  Laterality: N/A;   ESOPHAGOGASTRODUODENOSCOPY N/A 11/01/2020   Procedure: ESOPHAGOGASTRODUODENOSCOPY (EGD);  Surgeon: Therisa Bi, MD;  Location: [facility] ENDOSCOPY;  Service: Gastroenterology;  Laterality: N/A;   ESOPHAGOGASTRODUODENOSCOPY (EGD) WITH PROPOFOL  N/A 04/01/2018   Procedure: ESOPHAGOGASTRODUODENOSCOPY (EGD) WITH PROPOFOL ;  Surgeon: Mariner Gladis RAYMOND, MD;  Location: [facility] ENDOSCOPY;  Service: Endoscopy;  Laterality: N/A;  ESOPHAGOGASTRODUODENOSCOPY (EGD) WITH PROPOFOL  N/A 06/01/2022   Procedure: ESOPHAGOGASTRODUODENOSCOPY (EGD) WITH PROPOFOL ;  Surgeon: Therisa Bi, MD;  Location: [facility] ENDOSCOPY;  Service: Gastroenterology;  Laterality: N/A;   EYE SURGERY     HERNIA REPAIR     JOINT REPLACEMENT Bilateral    hips  RT+  LEFT X2    LEFT HEART CATH AND CORONARY ANGIOGRAPHY N/A 12/22/2020   Procedure: LEFT HEART CATH AND CORONARY ANGIOGRAPHY;  Surgeon: Ammon Blunt, MD;  Location: [facility] INVASIVE CV LAB;  Service: Cardiovascular;  Laterality: N/A;   LEFT HEART CATH AND  CORONARY ANGIOGRAPHY Left 08/23/2021   Procedure: LEFT HEART CATH AND CORONARY ANGIOGRAPHY;  Surgeon: Hester Wolm PARAS, MD;  Location: [facility] INVASIVE CV LAB;  Service: Cardiovascular;  Laterality: Left;   LUMBAR LAMINECTOMY/DECOMPRESSION MICRODISCECTOMY Left 09/13/2016   Procedure: Microdiscectomy - Lumbar two-three,  Lumbar three- - left;  Surgeon: Onetha Kuba, MD;  Location: [facility] OR;  Service: Neurosurgery;  Laterality: Left;   SPINAL CORD STIMULATOR INSERTION  07/08/2019   TONSILLECTOMY     Patient Active Problem List   Diagnosis Date Noted   Diabetes mellitus treated with oral medication (HCC) 04/05/2023   Subareolar mass of left breast 10/03/2022   COPD (chronic obstructive pulmonary disease) (HCC) 10/31/2021   Shortness of breath on exertion 08/04/2021   Diabetes mellitus with proteinuria (HCC) 04/04/2021   GERD without esophagitis 04/02/2021   Coronary artery disease 12/28/2020   History of non-ST elevation myocardial infarction (NSTEMI) 12/21/2020   Pain in right shin 10/25/2020   Peripheral vascular disease 08/06/2020   Chronic pain syndrome 07/06/2020   Cervical facet joint syndrome 04/08/2020   Spinal cord stimulator status 12/11/2019   Elevated TSH 08/20/2019   CKD (chronic kidney disease) stage 3, GFR 30-59 ml/min (HCC) 01/19/2019   Acquired thrombophilia 01/19/2019   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   Postlaminectomy syndrome, lumbar region 01/16/2019   History of fusion of lumbar spine (L2-L5) 01/16/2019   Chronic radicular lumbar pain 01/16/2019   HNP (herniated nucleus pulposus), lumbar 04/29/2018   Advanced care planning/counseling discussion 09/28/2016   Spinal stenosis, lumbar region, 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/13/2016   Hyperlipidemia associated with type 2 diabetes mellitus (HCC) 07/14/2015   Symptomatic anemia 06/30/2015   Benign prostatic hyperplasia without lower urinary tract symptoms 06/02/2015   OSA (obstructive sleep apnea) 03/23/2015    Hypertension associated with diabetes (HCC) 09/28/2014   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HCC) 09/28/2014   H/O prior ablation treatment 10/19/2011   AF (paroxysmal atrial fibrillation) (HCC) 10/19/2011   PCP: Valerio Melanie DASEN, NP  REFERRING PROVIDER: Maree Jannett POUR, MD   REFERRING DIAG: Peripheral Neuropathy  RATIONALE FOR EVALUATION AND TREATMENT: Rehabilitation  THERAPY DIAG: Muscle weakness (generalized)  Other low back pain  Unsteadiness on feet  Difficulty in walking, not elsewhere classified  ONSET DATE: 01/25/23 (acute on chronic)  FOLLOW-UP APPT SCHEDULED WITH REFERRING PROVIDER: Yes   FROM INITIAL EVALUATION SUBJECTIVE:  SUBJECTIVE STATEMENT:  Imbalance and LE weakness;  PERTINENT HISTORY:  Lower extremity weakness in patient with 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 He reports weakness in both legs and numbness in toes post surgery in 2018. He experienced a fall on 01/25/2023 at home, resulting in a L hip injury. No residual pain in hip at this time. No additional falls since December. He reports progressive LE weakness impairing his functional ability at home. He is known to this clinic from prior episodes of care for similar concerns.  Prior history: 11/02/22 Patient reports to physical therapy today with a chief concern of muscle weakness and imbalance. Patient reports progressive worsening in balance and muscular strength. He states that he has loss of  sensation in his toes he reports they are asleep. Patient reports pain in the anterior thigh and bilateral calves. He currently uses an loftstand crutch in his RUE. Lately patient has reported difficulty with dressing LE, performing fine motor movements with left hand. He denies falls, saddle parasthesia, nausea, vomitting, night sweats.    10/17/2022 Patient reported he is not doing well lately. He has been seeing Dr. Clois. He reports his legs are so weak that he can hardly walk, so he has been using a crutch for the last 1-2 years. Reported numbness and loss of sensation in his toes. He also reported that recently his left hand has began to has some numbness to where it feels like it is asleep. Denies pain, loss of sensation, coldness, burning. Denies falls.  Imaging: NCS conducted on 11/12/2018: Abnormal study. 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 There is also preliminary evidence of a superimposed left lower lumbosacral polyradiculopathy, based on NCV and distal needle exam results. I cannot test lumbar paraspinals due to patient on Eliquis .    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 Yes. 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  Recent memory is intact.  Remote memory is intact.  Attention span and concentration are intact.  Expressive speech is intact.  Patient's fund of knowledge is  within normal limits for educational level.    Gross Musculoskeletal Assessment Tremor: None Bulk: Normal Tone: Normal  Posture: Forward head and rounded shoulders  AROM Deferred specific measurements. Functional motion intact;  LE MMT: MMT (out of 5) Right  Left   Hip flexion 4 4+  Hip extension    Hip abduction (seated) 3+ 3+  Hip adduction (seated 3+ 3+  Hip internal rotation    Hip external rotation    Knee flexion (seated) 4 4  Knee extension 4 4  Ankle dorsiflexion 4 4  Ankle plantarflexion    Ankle inversion    Ankle eversion    (* = pain; Blank rows = not tested)  Transfers: Assistive device utilized: Single Lofstrand RUE  Sit to stand: Modified independence Stand to sit: Modified independence Chair to chair: Modified independence Floor: Deferred  Stairs: Level of Assistance: CGA Stair Negotiation Technique: Alternating Pattern  with Bilateral Rails Number of Stairs: 4  Height of Stairs: 6  Comments: Slow and labored ascending/descending. No overt LOB;  Gait: Gait pattern: decreased step length- Right, decreased step length- Left, trunk flexed, poor foot clearance- Right, and poor foot clearance- Left Distance walked:  150' over the course of evaluation Assistive device utilized: Single Lofstrand in RUE Level of assistance: Modified independence Comments: Slow and labored ambulation. Decreased self selected speed  Functional Outcome Measures  Results Comments  BERG 40/56 Increased fall risk  DGI    FGA    TUG 22.2 seconds Increased fall risk  5TSTS Unable Increased fall risk  6 Minute Walk Test    10 Meter Gait Speed Self-selected: 20.5s = 0.49 m/s; Fastest: 18.0s = 0.56 m/s Below community ambulation speed  (Blank rows = not tested)  ABC: 39.4%  Functional Outcome Measures  Results Comments  BERG 52/56 Mild balance deficits  DGI    FGA    TUG 13.6 seconds no crutch 16.2 seconds with crutch Increased fall risk  5TSTS 13.9 seconds WNL  6 Minute Walk Test    10 Meter Gait Speed Self-selected: 20.2s = 0.5 m/s; Fastest: 16s = 0.63 m/s Below community ambulation speed (0.8 m/s)    TODAY'S TREATMENT: 01/02/2024    SUBJECTIVE: Patient reports ongoing R shin pain sensitive to touch. No falls or stumbles reported since last session. Pt states that he woke up at 4am this morning and wasn't really able to fall back asleep for very long so he is more tired than usual.    PAIN: 4/10 chronic low back pain. Sharp R shin pain to light touch.    Therapeutic Activity NuStep L1-4 x 10 minutes, for BLE strengthening during interval history; Sit to stand with 6 # ball overhead press 2 x 10; Fwd step over 6 and 12 hurdles in // bars x 3 laps;  Forward and lateral 6 step-ups alternating leading LE x 10 each;   Therapeutic Exercise Seated LAQ with 3# ankle weights 2 x 20 BLE; Standing hip strengthening with 3# ankle weights (AW): Hip abduction x 20 BLE; Hip HS curl x 20 BLE;  Standing heel raises 2 x 20 BLE;  Hip flexion marches 2 x 20 BLE; Seated hip abduction with BTB around knees 2 x 20  Seated hip adduction ball squeeze x 20   Not performed this session: Standing on AE pad, Alternating  Basketball chest press and bounce pass with PT, 2 x 3' Mini squats 2 x 20; SLS hip 3 way cone taps 2 x 8 ea leg; Cross-over stepping in //  bars without UE support x multiple lengths; Nautilus resisted gait 60# R lateral and L lateral x 3 each, repeated standing rest breaks due to fatigue, pt performs slowly;   PATIENT EDUCATION:  Education details: Pt educated throughout session about proper posture and technique with exercises. Improved exercise technique, movement at target joints, use of target muscles after min to mod verbal, visual, tactile cues.  Person educated: Patient Education method: Explanation, Verbal cues, and Handouts Education comprehension: verbalized understanding and returned demonstration   HOME EXERCISE PROGRAM:  Access Code: [email or web address] URL: [email or web address] Date: 05/31/2023 Prepared by: Selinda Eck  Exercises - Seated Long Arc Quad  - 1 x daily - 3-4 x weekly - 3 sets - 10 reps - 2s hold - Seated Hip Abduction with Resistance  - 1 x daily - 3-4 x weekly - 3 sets - 10 reps - 2s hold - Heel Raises with Counter Support  - 1 x daily - 3-4 x weekly - 3 sets - 10 reps - 2s hold - Mini Squat with Counter Support  - 1 x daily - 3-4 x weekly - 3 sets - 10 reps - Semi-Tandem Balance at [employer] Eyes Open  - 1 x daily - 7 x weekly - 2-3 sets - 10 reps - 10-30 hold   ASSESSMENT:  CLINICAL IMPRESSION:    Pt with increased difficulty performing sit to stand transfers/activity with overhead ball press on this day and required multiple attempts to gain enough forward momentum in order to stand without assistance from therapist. Pt noted to have instance of sharp, shooting pain in right shin randomly when patient was ambulating around clinic, but symptoms passed shortly afterwards with rest. Pt experienced increased difficulty with hip abduction to right side and reported fatigue at completion of exercise secondary to endurance deficits of right hip  musculature. Pt reported significant, systemic fatigue at end of tx session but no increase in low back pain. Pt will continue to benefit from PT services to address remaining deficits in strength, balance, and mobility in order to return to full function at home and decrease his risk for falls.    OBJECTIVE IMPAIRMENTS: Abnormal gait, decreased balance, difficulty walking, decreased strength, impaired sensation, and pain.   ACTIVITY LIMITATIONS: bending, standing, squatting, stairs, transfers, and caring for others  PARTICIPATION LIMITATIONS: meal prep, cleaning, laundry, shopping, and community activity  PERSONAL FACTORS: Age, Past/current experiences, Time since onset of injury/illness/exacerbation, and 3+ comorbidities: generalized polyneuropathy, COPD, DM, NSTEMI, and spinal stenosis are also affecting patient's functional outcome.   REHAB POTENTIAL: Fair    CLINICAL DECISION MAKING: Unstable/unpredictable  EVALUATION COMPLEXITY: High   GOALS: Goals reviewed with patient? No  SHORT TERM GOALS: Target date: 07/10/2023  Pt will be independent with HEP in order to improve strength and balance in order to decrease fall risk and improve function at home. Baseline:  Goal status: MET   LONG TERM GOALS: Target date: 11/15/2023  Pt will improve ABC by at least 13% in order to demonstrate clinically significant improvement in balance confidence.  Baseline: 20%; 07/10/23: 30%, 08/13/23: 36.25%; 12/20/23: 39.4% Goal status: ACHIEVED  2.  Pt will improve BERG by at least 3 points in order to demonstrate clinically significant improvement in balance.   Baseline: 40/56; 07/10/23: 50/56; 08/13/23: 52/56; 12/20/23: 52/56; Goal status: ACHIEVED  3.  Pt will increase self-selected to at least 0.6 m/s in order to demonstrate clinically significant improvement in limited community ambulation.         Baseline:  self-selected: 0.49 m/s; 07/10/23: 0.52 m/s; 08/13/23: 0.49 m/s; 12/20/23: 0.50 m/s; Goal  status: PARTIALLY MET;  4. Pt will be able to complete 5TSTS without UE support in order to demonstrate clinically significant improvement in LE strength. Baseline: Unable to perform sit to stand without heavy UE assist; 07/10/23: 11.4s; 08/13/23: 12.72 s; 12/20/23: 13.9s; Goal status: ACHIEVED  5. Pt will decrease TUG to below 14 seconds/decrease in order to demonstrate decreased fall risk.  Baseline: 22.2s; 07/10/23: 14.0s; 08/13/23: 24.53s no crutch, 16.20s with crutch; 12/20/23: 13.6s no crutch, 16.2s with crutch Goal status: ONGOING  PLAN: PT FREQUENCY: 2x/week  PT DURATION: 12 weeks  PLANNED INTERVENTIONS: Therapeutic exercises, Therapeutic activity, Neuromuscular re-education, Balance training, Gait training, Patient/Family education, Self Care, Joint mobilization, Joint manipulation, Vestibular training, Canalith repositioning, Orthotic/Fit training, DME instructions, Dry Needling, Electrical stimulation, Spinal manipulation, Spinal mobilization, Cryotherapy, Moist heat, Taping, Traction, Ultrasound, Ionotophoresis 4mg /ml Dexamethasone , Manual therapy, and Re-evaluation.  PLAN FOR NEXT SESSION: progress strengthening exercises, walking/ambulation speed and endurance tasks, modify/progress HEP as needed;   Jason D Huprich PT, DPT, GCS  Curtistine Bracket, SPT  Physical Therapist - [facility]  [facility] 8:42 AM,01/02/24

## 2024-01-03 ENCOUNTER — Ambulatory Visit

## 2024-01-03 DIAGNOSIS — M5459 Other low back pain: Secondary | ICD-10-CM | POA: Diagnosis not present

## 2024-01-03 DIAGNOSIS — M6281 Muscle weakness (generalized): Secondary | ICD-10-CM | POA: Diagnosis not present

## 2024-01-03 DIAGNOSIS — R262 Difficulty in walking, not elsewhere classified: Secondary | ICD-10-CM | POA: Diagnosis not present

## 2024-01-03 DIAGNOSIS — R2681 Unsteadiness on feet: Secondary | ICD-10-CM | POA: Diagnosis not present

## 2024-01-03 NOTE — Therapy (Signed)
 OUTPATIENT PHYSICAL THERAPY BALANCE TREATMENT NOTE   Patient Name: Sean Reyes. MRN: [id number] DOB:10-25-1940, 83 y.o., male Today's Date: 01/03/2024  END OF SESSION:  PT End of Session - 01/03/24 1323     Visit Number 34    Number of Visits 73    Date for Recertification  01/26/24    Authorization Type eval: 05/29/23;    Authorization Time Period Medicare A&B 2025  VL: Based on [place]  No auth req    PT Start Time 1400    PT Stop Time 1445    PT Time Calculation (min) 45 min    Equipment Utilized During Treatment Gait belt    Activity Tolerance Patient tolerated treatment well;Patient limited by fatigue;Patient limited by pain    Behavior During Therapy WFL for tasks assessed/performed         Past Medical History:  Diagnosis Date   Anemia    Anxiety    Arthritis    Arthritis of neck    Atrial fibrillation (HCC)    Cataracts, bilateral    Complication of anesthesia    pt reports low BP's after surgery at [facility] and difficulty awakening   Depression    Diabetes (HCC)    dx 6-8 yrs ago   Dysrhythmia    a-fib   GERD (gastroesophageal reflux disease)    OCC TAKES ALKA SELTZER   History of kidney stones    10-15 yrs ago   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   Hyperlipidemia    Hypertension    Myocardial infarction (HCC) 12/2020   Nocturia    S/P ablation of atrial fibrillation    Ablative therapy   Sleep apnea    CPAP    Spinal stenosis    Tachycardia, unspecified    Past Surgical History:  Procedure Laterality Date   ABLATION     ANTERIOR LAT LUMBAR FUSION N/A 06/27/2017   Procedure: Anterior Lateral Lumbar Interbody  Fusion - Lumbar Two-Lumbar Three - Lumbar Three-Lumbar Four, Posterior Lumbar Interbody Fusion Lumbar Four- Five;  Surgeon: Onetha Kuba, MD;  Location: [facility] OR;  Service: Neurosurgery;  Laterality: N/A;  Anterior Lateral Lumbar Interbody  Fusion - Lumbar Two-Lumbar Three - Lumbar Three-Lumbar Four, Posterior Lumbar Interbody  Fusion Lumbar Four- Five   BACK SURGERY     CARDIAC CATHETERIZATION     CARDIOVERSION N/A 08/29/2018   Procedure: CARDIOVERSION;  Surgeon: Hester Wolm PARAS, MD;  Location: [facility] ORS;  Service: Cardiovascular;  Laterality: N/A;   CARDIOVERSION N/A 09/24/2018   Procedure: CARDIOVERSION;  Surgeon: Hester Wolm PARAS, MD;  Location: [facility] ORS;  Service: Cardiovascular;  Laterality: N/A;   COLONOSCOPY WITH PROPOFOL  N/A 10/05/2015   Procedure: COLONOSCOPY WITH PROPOFOL ;  Surgeon: Gladis RAYMOND Mariner, MD;  Location: [facility] ENDOSCOPY;  Service: Endoscopy;  Laterality: N/A;   COLONOSCOPY WITH PROPOFOL  N/A 11/01/2020   Procedure: COLONOSCOPY WITH PROPOFOL ;  Surgeon: Therisa Bi, MD;  Location: [facility] ENDOSCOPY;  Service: Gastroenterology;  Laterality: N/A;   CORONARY STENT INTERVENTION N/A 12/22/2020   Procedure: CORONARY STENT INTERVENTION;  Surgeon: Ammon Blunt, MD;  Location: [facility] INVASIVE CV LAB;  Service: Cardiovascular;  Laterality: N/A;   ESOPHAGOGASTRODUODENOSCOPY N/A 11/01/2020   Procedure: ESOPHAGOGASTRODUODENOSCOPY (EGD);  Surgeon: Therisa Bi, MD;  Location: [facility] ENDOSCOPY;  Service: Gastroenterology;  Laterality: N/A;   ESOPHAGOGASTRODUODENOSCOPY (EGD) WITH PROPOFOL  N/A 04/01/2018   Procedure: ESOPHAGOGASTRODUODENOSCOPY (EGD) WITH PROPOFOL ;  Surgeon: Mariner Gladis RAYMOND, MD;  Location: [facility] ENDOSCOPY;  Service: Endoscopy;  Laterality: N/A;  ESOPHAGOGASTRODUODENOSCOPY (EGD) WITH PROPOFOL  N/A 06/01/2022   Procedure: ESOPHAGOGASTRODUODENOSCOPY (EGD) WITH PROPOFOL ;  Surgeon: Therisa Bi, MD;  Location: [facility] ENDOSCOPY;  Service: Gastroenterology;  Laterality: N/A;   EYE SURGERY     HERNIA REPAIR     JOINT REPLACEMENT Bilateral    hips  RT+  LEFT X2    LEFT HEART CATH AND CORONARY ANGIOGRAPHY N/A 12/22/2020   Procedure: LEFT HEART CATH AND CORONARY ANGIOGRAPHY;  Surgeon: Ammon Blunt, MD;  Location: [facility] INVASIVE CV LAB;  Service: Cardiovascular;  Laterality: N/A;   LEFT HEART CATH AND  CORONARY ANGIOGRAPHY Left 08/23/2021   Procedure: LEFT HEART CATH AND CORONARY ANGIOGRAPHY;  Surgeon: Hester Wolm PARAS, MD;  Location: [facility] INVASIVE CV LAB;  Service: Cardiovascular;  Laterality: Left;   LUMBAR LAMINECTOMY/DECOMPRESSION MICRODISCECTOMY Left 09/13/2016   Procedure: Microdiscectomy - Lumbar two-three,  Lumbar three- - left;  Surgeon: Onetha Kuba, MD;  Location: [facility] OR;  Service: Neurosurgery;  Laterality: Left;   SPINAL CORD STIMULATOR INSERTION  07/08/2019   TONSILLECTOMY     Patient Active Problem List   Diagnosis Date Noted   Diabetes mellitus treated with oral medication (HCC) 04/05/2023   Subareolar mass of left breast 10/03/2022   COPD (chronic obstructive pulmonary disease) (HCC) 10/31/2021   Shortness of breath on exertion 08/04/2021   Diabetes mellitus with proteinuria (HCC) 04/04/2021   GERD without esophagitis 04/02/2021   Coronary artery disease 12/28/2020   History of non-ST elevation myocardial infarction (NSTEMI) 12/21/2020   Pain in right shin 10/25/2020   Peripheral vascular disease 08/06/2020   Chronic pain syndrome 07/06/2020   Cervical facet joint syndrome 04/08/2020   Spinal cord stimulator status 12/11/2019   Elevated TSH 08/20/2019   CKD (chronic kidney disease) stage 3, GFR 30-59 ml/min (HCC) 01/19/2019   Acquired thrombophilia 01/19/2019   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   Postlaminectomy syndrome, lumbar region 01/16/2019   History of fusion of lumbar spine (L2-L5) 01/16/2019   Chronic radicular lumbar pain 01/16/2019   HNP (herniated nucleus pulposus), lumbar 04/29/2018   Advanced care planning/counseling discussion 09/28/2016   Spinal stenosis, lumbar region, 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/13/2016   Hyperlipidemia associated with type 2 diabetes mellitus (HCC) 07/14/2015   Symptomatic anemia 06/30/2015   Benign prostatic hyperplasia without lower urinary tract symptoms 06/02/2015   OSA (obstructive sleep apnea) 03/23/2015    Hypertension associated with diabetes (HCC) 09/28/2014   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HCC) 09/28/2014   H/O prior ablation treatment 10/19/2011   AF (paroxysmal atrial fibrillation) (HCC) 10/19/2011   PCP: Valerio Melanie DASEN, NP  REFERRING PROVIDER: Maree Jannett POUR, MD   REFERRING DIAG: Peripheral Neuropathy  RATIONALE FOR EVALUATION AND TREATMENT: Rehabilitation  THERAPY DIAG: Muscle weakness (generalized)  Other low back pain  Unsteadiness on feet  ONSET DATE: 01/25/23 (acute on chronic)  FOLLOW-UP APPT SCHEDULED WITH REFERRING PROVIDER: Yes   FROM INITIAL EVALUATION SUBJECTIVE:  SUBJECTIVE STATEMENT:  Imbalance and LE weakness;  PERTINENT HISTORY:  Lower extremity weakness in patient with 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 He reports weakness in both legs and numbness in toes post surgery in 2018. He experienced a fall on 01/25/2023 at home, resulting in a L hip injury. No residual pain in hip at this time. No additional falls since December. He reports progressive LE weakness impairing his functional ability at home. He is known to this clinic from prior episodes of care for similar concerns.  Prior history: 11/02/22 Patient reports to physical therapy today with a chief concern of muscle weakness and imbalance. Patient reports progressive worsening in balance and muscular strength. He states that he has loss of sensation in his toes he reports they are asleep.  Patient reports pain in the anterior thigh and bilateral calves. He currently uses an loftstand crutch in his RUE. Lately patient has reported difficulty with dressing LE, performing fine motor movements with left hand. He denies falls, saddle parasthesia, nausea, vomitting, night sweats.    10/17/2022 Patient reported he is not doing well lately. He has been seeing Dr. Clois. He reports his legs are so weak that he can hardly walk, so he has been using a crutch for the last 1-2 years. Reported numbness and loss of sensation in his toes. He also reported that recently his left hand has began to has some numbness to where it feels like it is asleep. Denies pain, loss of sensation, coldness, burning. Denies falls.  Imaging: NCS conducted on 11/12/2018: Abnormal study. 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 There is also preliminary evidence of a superimposed left lower lumbosacral polyradiculopathy, based on NCV and distal needle exam results. I cannot test lumbar paraspinals due to patient on Eliquis .    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 Yes. 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  Recent memory is intact.  Remote memory is intact.  Attention span and concentration are intact.  Expressive speech is intact.  Patient's fund of knowledge is  within normal limits for educational level.    Gross Musculoskeletal Assessment Tremor: None Bulk: Normal Tone: Normal  Posture: Forward head and rounded shoulders  AROM Deferred specific measurements. Functional motion intact;  LE MMT: MMT (out of 5) Right  Left   Hip flexion 4 4+  Hip extension    Hip abduction (seated) 3+ 3+  Hip adduction (seated 3+ 3+  Hip internal rotation    Hip external rotation    Knee flexion (seated) 4 4  Knee extension 4 4  Ankle dorsiflexion 4 4  Ankle plantarflexion    Ankle inversion    Ankle eversion    (* = pain; Blank rows = not tested)  Transfers: Assistive device utilized: Single Lofstrand RUE  Sit to stand: Modified independence Stand to sit: Modified independence Chair to chair: Modified independence Floor: Deferred  Stairs: Level of Assistance: CGA Stair Negotiation Technique: Alternating Pattern  with Bilateral Rails Number of Stairs: 4  Height of Stairs: 6  Comments: Slow and labored ascending/descending. No overt LOB;  Gait: Gait pattern: decreased step length- Right, decreased step length- Left, trunk flexed, poor foot clearance- Right, and poor foot clearance- Left Distance walked: 150' over the course of evaluation Assistive device utilized:  Single Lofstrand in RUE Level of assistance: Modified independence Comments: Slow and labored ambulation. Decreased self selected speed  Functional Outcome Measures  Results Comments  BERG 40/56 Increased fall risk  DGI    FGA    TUG 22.2 seconds Increased fall risk  5TSTS Unable Increased fall risk  6 Minute Walk Test    10 Meter Gait Speed Self-selected: 20.5s = 0.49 m/s; Fastest: 18.0s = 0.56 m/s Below community ambulation speed  (Blank rows = not tested)  ABC: 39.4%  Functional Outcome Measures  Results Comments  BERG 52/56 Mild balance deficits  DGI    FGA    TUG 13.6 seconds no crutch 16.2 seconds with crutch Increased fall risk  5TSTS 13.9 seconds WNL  6 Minute Walk Test    10 Meter Gait Speed Self-selected: 20.2s = 0.5 m/s; Fastest: 16s = 0.63 m/s Below community ambulation speed (0.8 m/s)    TODAY'S TREATMENT: 01/03/2024     SUBJECTIVE: Patient reports ongoing R shin pain sensitive to touch. No falls or stumbles reported since last session.No specific questions or concerns currently.   PAIN: 3/10 chronic low back pain. Sharp R shin pain to light touch.    Therapeutic Activity NuStep L1-5 x 10 minutes, for BLE strengthening during interval history; Nautilus resisted gait 60# forward, backward, R lateral and L lateral x 3 each, multiple seated rest breaks due to fatigue; Forward and lateral 6 step-ups alternating leading LE x 10 each; Seated LAQ with 3# ankle weights x 20 BLE;  Standing hip strengthening with 3# ankle weights (AW): Hip abduction x 20 BLE; Hip HS curl x 20 BLE;  Hip flexion marches x 20 BLE;   Not performed: Standing on AE pad, Alternating Basketball chest press and bounce pass with PT, 2 x 3' Mini squats 2 x 20; SLS hip 3 way cone taps 2 x 8 ea leg; Cross-over stepping in // bars without UE support x multiple lengths; Seated hip abduction with BTB around knees 2 x 20; Seated hip adduction ball squeeze x 20; Sit to stand with 6 # ball  overhead press 2 x 10; Fwd step over 6 and 12 hurdles in // bars x 3 laps;     PATIENT EDUCATION:  Education details: Pt educated throughout  session about proper posture and technique with exercises. Improved exercise technique, movement at target joints, use of target muscles after min to mod verbal, visual, tactile cues.  Person educated: Patient Education method: Explanation, Verbal cues, and Handouts Education comprehension: verbalized understanding and returned demonstration   HOME EXERCISE PROGRAM:  Access Code: [email or web address] URL: [email or web address] Date: 05/31/2023 Prepared by: Selinda Eck  Exercises - Seated Long Arc Quad  - 1 x daily - 3-4 x weekly - 3 sets - 10 reps - 2s hold - Seated Hip Abduction with Resistance  - 1 x daily - 3-4 x weekly - 3 sets - 10 reps - 2s hold - Heel Raises with Counter Support  - 1 x daily - 3-4 x weekly - 3 sets - 10 reps - 2s hold - Mini Squat with Counter Support  - 1 x daily - 3-4 x weekly - 3 sets - 10 reps - Semi-Tandem Balance at [employer] Eyes Open  - 1 x daily - 7 x weekly - 2-3 sets - 10 reps - 10-30 hold   ASSESSMENT:  CLINICAL IMPRESSION:    Patient arrives to treatment session very motivated to participate. Session focused on BLE strengthening today. He continues to require multiple rest breaks throughout. Pt demonstrated appropriate effort throughout the session. Plan to progress strengthening at future sessions as pt can tolerate. He will benefit from PT services to address deficits in strength, balance, and mobility in order to improve function at home and decrease his risk for falls.    OBJECTIVE IMPAIRMENTS: Abnormal gait, decreased balance, difficulty walking, decreased strength, impaired sensation, and pain.   ACTIVITY LIMITATIONS: bending, standing, squatting, stairs, transfers, and caring for others  PARTICIPATION LIMITATIONS: meal prep, cleaning, laundry, shopping, and community activity  PERSONAL  FACTORS: Age, Past/current experiences, Time since onset of injury/illness/exacerbation, and 3+ comorbidities: generalized polyneuropathy, COPD, DM, NSTEMI, and spinal stenosis are also affecting patient's functional outcome.   REHAB POTENTIAL: Fair    CLINICAL DECISION MAKING: Unstable/unpredictable  EVALUATION COMPLEXITY: High   GOALS: Goals reviewed with patient? No  SHORT TERM GOALS: Target date: 07/10/2023  Pt will be independent with HEP in order to improve strength and balance in order to decrease fall risk and improve function at home. Baseline:  Goal status: MET   LONG TERM GOALS: Target date: 11/15/2023  Pt will improve ABC by at least 13% in order to demonstrate clinically significant improvement in balance confidence.  Baseline: 20%; 07/10/23: 30%, 08/13/23: 36.25%; 12/20/23: 39.4% Goal status: ACHIEVED  2.  Pt will improve BERG by at least 3 points in order to demonstrate clinically significant improvement in balance.   Baseline: 40/56; 07/10/23: 50/56; 08/13/23: 52/56; 12/20/23: 52/56; Goal status: ACHIEVED  3.  Pt will increase self-selected to at least 0.6 m/s in order to demonstrate clinically significant improvement in limited community ambulation.         Baseline: self-selected: 0.49 m/s; 07/10/23: 0.52 m/s; 08/13/23: 0.49 m/s; 12/20/23: 0.50 m/s; Goal status: PARTIALLY MET;  4. Pt will be able to complete 5TSTS without UE support in order to demonstrate clinically significant improvement in LE strength. Baseline: Unable to perform sit to stand without heavy UE assist; 07/10/23: 11.4s; 08/13/23: 12.72 s; 12/20/23: 13.9s; Goal status: ACHIEVED  5. Pt will decrease TUG to below 14 seconds/decrease in order to demonstrate decreased fall risk.  Baseline: 22.2s; 07/10/23: 14.0s; 08/13/23: 24.53s no crutch, 16.20s with crutch; 12/20/23: 13.6s no crutch, 16.2s with crutch Goal status: ONGOING  PLAN: PT FREQUENCY: 2x/week  PT DURATION: 12 weeks  PLANNED INTERVENTIONS:  Therapeutic exercises, Therapeutic activity, Neuromuscular re-education, Balance training, Gait training, Patient/Family education, Self Care, Joint mobilization, Joint manipulation, Vestibular training, Canalith repositioning, Orthotic/Fit training, DME instructions, Dry Needling, Electrical stimulation, Spinal manipulation, Spinal mobilization, Cryotherapy, Moist heat, Taping, Traction, Ultrasound, Ionotophoresis 4mg /ml Dexamethasone , Manual therapy, and Re-evaluation.  PLAN FOR NEXT SESSION: progress strengthening exercises, walking/ambulation speed and endurance tasks, modify/progress HEP as needed;   Sadonna Kotara D Kable Haywood PT, DPT, GCS  Physical Therapist - [facility] 2:47 PM,01/03/24

## 2024-01-05 NOTE — Patient Instructions (Incomplete)
 Diabetes Mellitus and Exercise Regular exercise is important for your health, especially if you have diabetes mellitus. Exercise is not just about losing weight. It can also help you increase muscle strength and bone density and reduce body fat and stress. This can help your level of endurance and make you more fit and flexible. Why should I exercise if I have diabetes? Exercise has many benefits for people with diabetes. It can: Help lower and control your blood sugar (glucose). Help your body respond better and become more sensitive to the hormone insulin. Reduce how much insulin your body needs. Lower your risk for heart disease by: Lowering how much "bad" cholesterol and triglycerides you have in your body. Increasing how much "good" cholesterol you have in your body. Lowering your blood pressure. Lowering your blood glucose levels. What is my activity plan? Your health care provider or an expert trained in diabetes care (certified diabetes educator) can help you make an activity plan. This plan can help you find the type of exercise that works for you. It may also tell you how often to exercise and for how long. Be sure to: Get at least 150 minutes of medium-intensity or high-intensity exercise each week. This may involve brisk walking, biking, or water aerobics. Do stretching and strengthening exercises at least 2 times a week. This may involve yoga or weight lifting. Spread out your activity over at least 3 days of the week. Get some form of physical activity each day. Do not go more than 2 days in a row without some kind of activity. Avoid being inactive for more than 30 minutes at a time. Take frequent breaks to walk or stretch. Choose activities that you enjoy. Set goals that you know you can accomplish. Start slowly and increase the intensity of your exercise over time. How do I manage my diabetes during exercise?  Monitor your blood glucose Check your blood glucose before and  after you exercise. If your blood glucose is 240 mg/dL (16.1 mmol/L) or higher before you exercise, check your urine for ketones. These are chemicals created by the liver. If you have ketones in your urine, do not exercise until your blood glucose returns to normal. If your blood glucose is 100 mg/dL (5.6 mmol/L) or lower, eat a snack that has 15-20 grams of carbohydrate in it. Check your blood glucose 15 minutes after the snack to make sure that your level is above 100 mg/dL (5.6 mmol/L) before you start to exercise. Your risk for low blood glucose (hypoglycemia) goes up during and after exercise. Know the symptoms of this condition and how to treat it. Follow these instructions at home: Keep a carbohydrate snack on hand for use before, during, and after exercise. This can help prevent or treat hypoglycemia. Avoid injecting insulin into parts of your body that are going to be used during exercise. This may include: Your arms, when you are going to play tennis. Your legs, when you are about to go jogging. Keep track of your exercise habits. This can help you and your health care provider watch and adjust your activity plan. Write down: What you eat before and after you exercise. Blood glucose levels before and after you exercise. The type and amount of exercise you do. Talk to your health care provider before you start a new activity. They may need to: Make sure that the activity is safe for you. Adjust your insulin, other medicines, and food that you eat. Drink water while you exercise. This can  stop you from losing too much water (dehydration). It can also prevent problems caused by having a lot of heat in your body (heat stroke). Where to find more information American Diabetes Association: diabetes.org Association of Diabetes Care & Education Specialists: diabeteseducator.org This information is not intended to replace advice given to you by your health care provider. Make sure you discuss  any questions you have with your health care provider. Document Revised: 07/20/2021 Document Reviewed: 07/20/2021 Elsevier Patient Education  2024 [employer].

## 2024-01-07 ENCOUNTER — Ambulatory Visit (INDEPENDENT_AMBULATORY_CARE_PROVIDER_SITE_OTHER): Admitting: Nurse Practitioner

## 2024-01-07 ENCOUNTER — Encounter: Payer: Self-pay | Admitting: Nurse Practitioner

## 2024-01-07 VITALS — BP 136/74 | HR 70 | Temp 98.4°F | Resp 17 | Ht 72.99 in | Wt 183.0 lb

## 2024-01-07 DIAGNOSIS — Z23 Encounter for immunization: Secondary | ICD-10-CM | POA: Diagnosis not present

## 2024-01-07 DIAGNOSIS — N62 Hypertrophy of breast: Secondary | ICD-10-CM

## 2024-01-07 DIAGNOSIS — G894 Chronic pain syndrome: Secondary | ICD-10-CM

## 2024-01-07 DIAGNOSIS — E1122 Type 2 diabetes mellitus with diabetic chronic kidney disease: Secondary | ICD-10-CM

## 2024-01-07 DIAGNOSIS — I152 Hypertension secondary to endocrine disorders: Secondary | ICD-10-CM | POA: Diagnosis not present

## 2024-01-07 DIAGNOSIS — I480 Paroxysmal atrial fibrillation: Secondary | ICD-10-CM | POA: Diagnosis not present

## 2024-01-07 DIAGNOSIS — J449 Chronic obstructive pulmonary disease, unspecified: Secondary | ICD-10-CM

## 2024-01-07 DIAGNOSIS — R809 Proteinuria, unspecified: Secondary | ICD-10-CM | POA: Diagnosis not present

## 2024-01-07 DIAGNOSIS — E1129 Type 2 diabetes mellitus with other diabetic kidney complication: Secondary | ICD-10-CM

## 2024-01-07 DIAGNOSIS — E119 Type 2 diabetes mellitus without complications: Secondary | ICD-10-CM | POA: Diagnosis not present

## 2024-01-07 DIAGNOSIS — E1169 Type 2 diabetes mellitus with other specified complication: Secondary | ICD-10-CM | POA: Diagnosis not present

## 2024-01-07 DIAGNOSIS — E785 Hyperlipidemia, unspecified: Secondary | ICD-10-CM | POA: Diagnosis not present

## 2024-01-07 DIAGNOSIS — E1159 Type 2 diabetes mellitus with other circulatory complications: Secondary | ICD-10-CM

## 2024-01-07 DIAGNOSIS — E1143 Type 2 diabetes mellitus with diabetic autonomic (poly)neuropathy: Secondary | ICD-10-CM

## 2024-01-07 DIAGNOSIS — N1831 Chronic kidney disease, stage 3a: Secondary | ICD-10-CM

## 2024-01-07 DIAGNOSIS — R220 Localized swelling, mass and lump, head: Secondary | ICD-10-CM | POA: Diagnosis not present

## 2024-01-07 DIAGNOSIS — D6869 Other thrombophilia: Secondary | ICD-10-CM

## 2024-01-07 DIAGNOSIS — I739 Peripheral vascular disease, unspecified: Secondary | ICD-10-CM

## 2024-01-07 DIAGNOSIS — Z7984 Long term (current) use of oral hypoglycemic drugs: Secondary | ICD-10-CM | POA: Diagnosis not present

## 2024-01-07 LAB — BAYER DCA HB A1C WAIVED: HB A1C (BAYER DCA - WAIVED): 7 % — ABNORMAL HIGH (ref 4.8–5.6)

## 2024-01-07 MED ORDER — HYDRALAZINE HCL 25 MG PO TABS
25.0000 mg | ORAL_TABLET | Freq: Two times a day (BID) | ORAL | 1 refills | Status: AC | PRN
Start: 2024-01-07 — End: ?

## 2024-01-07 MED ORDER — AMLODIPINE BESYLATE 10 MG PO TABS: 10.0000 mg | ORAL_TABLET | Freq: Every day | ORAL | 3 refills | Status: DC

## 2024-01-07 NOTE — Assessment & Plan Note (Signed)
 He feels left breast is worsening and requests repeat mammogram to check on this.

## 2024-01-07 NOTE — Assessment & Plan Note (Signed)
 Chronic, ongoing with A1c 7% today and urine ALB 150 (February 2025).  Significant decreased sensation bilateral feet, monitor closely for wounds and falls.  Continue current diabetes medication regimen and adjust as needed. He did not get benefit from Lyrica , Duloxetine, or Gabapentin  in past, will continue collaboration with neurology and pain clinic as needed.  Continue to monitor BS daily and document for visits. LABS: A1c and CMP. Does find benefit from performing physical therapy, but continues to struggle with discomfort. - Statin on board. Holding ACE due to lip swelling over past months on and off. - Vaccinations up to date - Foot and eye exams up to date

## 2024-01-07 NOTE — Assessment & Plan Note (Signed)
Patient on Eliquis with A-fib, monitor CBC regularly.

## 2024-01-07 NOTE — Assessment & Plan Note (Signed)
Chronic, ongoing.  Followed by neurosurgery and pain management as needed.  Continue this collaboration and current regimen. Lidocaine patches present at home.

## 2024-01-07 NOTE — Assessment & Plan Note (Signed)
 Chronic, stable.  Followed by pulmonary at this time, continue this collaboration.  He is using Anoro without issue.  Reports some improvement in SOB, but continues to have this with heavier exertion.

## 2024-01-07 NOTE — Assessment & Plan Note (Signed)
Chronic, ongoing.  Continue current medication regimen, tolerating 5 MG Crestor well without ADR, and adjust as needed.  Lipid panel today.

## 2024-01-07 NOTE — Assessment & Plan Note (Signed)
Refer to HTN plan of care.

## 2024-01-07 NOTE — Assessment & Plan Note (Signed)
 Refer to diabetes with proteinuria plan of care.

## 2024-01-07 NOTE — Assessment & Plan Note (Signed)
 Chronic, ongoing.  Followed by cardiology at this time.  Continue current medication regimen and collaboration, appreciate their input.  HR with stable rate today.

## 2024-01-07 NOTE — Assessment & Plan Note (Signed)
 Chronic, ongoing CKD 3a.  Continue to monitor closely and refer to nephrology if decline.  Stopping Benazepril  due to upper lip swelling. Consider trial of ARB in future.  Renal dose medications as needed based on labs.  Would benefit from SGLT2, but refuses due to concern for increased urination which he already struggles with.

## 2024-01-07 NOTE — Progress Notes (Signed)
 BP 136/74 (BP Location: Left Arm, Patient Position: Sitting, Cuff Size: Normal)   Pulse 70   Temp 98.4 F (36.9 C) (Oral)   Resp 17   Ht 6' 0.99 (1.854 m)   Wt 183 lb (83 kg)   SpO2 98%   BMI 24.15 kg/m    Subjective:    Patient ID: Sean Clancy Raddle., male    DOB: 1940/07/03, 83 y.o.   MRN: [id number]  HPI: Sean Goerner. is a 83 y.o. male  Chief Complaint  Patient presents with   Diabetes    Checks about 2-3 mornings a week. 169 this morning which it has been elevated a bit lately.    Hypertension    This morning 131/79 checks every morning. He takes up to 1 1/2 pills depending on what his pressures are running.    Hyperlipidemia   Chronic Kidney Disease    No concerns.    Atrial Fibrillation    Next appointment is in January with cardiology. No issues recently.    Anemia   Pain Management    Mostly lower back pain and neuropathy pain.    COPD    Has not gotten any worse. Seen Pulmonology recently in April/May for repeat breathing test.    Would like to go for mammogram again, feels the lump to left breast is getting worse. Was screened last year and gynecomastia noted.  DIABETES WITH NEUROPATHY August A1c 7.1%. Takes Metformin . Cardiology recommended SGLT2 in past, although patient concerned with this due to BPH and his frequent urination at baseline. Has cut back on ice cream to twice a week. Hypoglycemic episodes:no Polydipsia/polyuria: no Visual disturbance: no Chest pain: no Paresthesias: no Glucose Monitoring: yes  Accucheck frequency: a few times a week  Fasting glucose: 169 this morning, average 140 range  Post prandial:  Evening:  Before meals: Taking Insulin ?: no  Long acting insulin :  Short acting insulin : Blood Pressure Monitoring: daily Retinal Examination: Up To Date -- Dr. Launie in [place] Foot Exam: Up to Date Pneumovax: Up to Date Influenza: Up to Date Aspirin : no    HYPERTENSION / HYPERLIPIDEMIA Takes Amlodipine , Benazepril , and  Rosuvastatin . Continues CPAP and using more consistently. Cardiology visit last on 08/14/23. Reports occasional upper lip swelling noted, unsure why this happens. Sometimes the upper gums will bleed with this.  Has seen dentist with no findings. No other symptoms at the time.  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  Satisfied with current treatment? yes Duration of hypertension: chronic BP monitoring frequency: daily BP range: <130/80 this morning BP medication side effects: no Duration of hyperlipidemia: chronic Cholesterol medication side effects: no Cholesterol supplements: none Medication compliance: good compliance Aspirin : no Recent stressors: no Recurrent headaches: no Visual changes: no Palpitations: no Dyspnea: at baseline with activity, uses inhaler Chest pain: no Lower extremity edema: occasional to left ankle Dizzy/lightheaded: no  COPD Has Anoro inhaler at home and uses every morning. COPD status: stable Satisfied with current treatment?: yes Oxygen use: no Dyspnea frequency: at baseline with activity, uses inhaler Cough frequency: none Rescue inhaler frequency:  none Limitation of activity: no Productive cough: none Last Spirometry: with pulmonary Pneumovax: Up to Date Influenza: Up to Date  ATRIAL FIBRILLATION Atrial fibrillation status: stable Satisfied with current treatment: yes  Medication side effects:  no Medication compliance: good compliance Etiology of atrial fibrillation: unknown Palpitations: no Chest pain:  no Dyspnea on exertion: as above Orthopnea:  no Syncope:  no Edema:  no  Ventricular rate control: was on BB, but had to stop due to bradycardia Anti-coagulation: long acting   CHRONIC KIDNEY DISEASE (CKD 3a) & ANEMIA Follows with hematology, last visit 10/17/23. CKD status: stable Medications renally dose: yes Previous renal evaluation: no Pneumovax:  Up to Date Influenza Vaccine:  Up to Date     Latest Ref Rng &  Units 10/17/2023   12:49 PM 10/05/2023    3:40 PM 07/04/2023    1:34 PM  BMP  Glucose 70 - 99 mg/dL 774  871  757   BUN 8 - 23 mg/dL 27  21  21    Creatinine 0.61 - 1.24 mg/dL 8.77  8.86  8.67   BUN/Creat Ratio 10 - 24  19  16    Sodium 135 - 145 mmol/L 132  137  135   Potassium 3.5 - 5.1 mmol/L 4.4  4.8  4.6   Chloride 98 - 111 mmol/L 100  101  98   CO2 22 - 32 mmol/L 23  20  23    Calcium  8.9 - 10.3 mg/dL 9.3  9.5  9.4     CHRONIC PAIN  Has pain stimulator in place. 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 Neurology last seen on 07/25/23. Going to PT, last visit 01/03/24. In past tried Lyrica , Cymbalta, and Gabapentin  without benefit.  Pain clinic visit on 10/11/23. Reports he will no longer go to pain clinic there. Pain control status: stable Duration: years Location: back and legs Quality: dull, aching, and throbbing Current Pain Level: 7/10 at worst, sitting 0/10 Previous Pain Level: moderate 7-8/10 Breakthrough pain: no What Activities task can be accomplished with current medication? yes Interested in weaning off narcotics:no   Stool softners/OTC fiber: no  Previous pain specialty evaluation: yes Non-narcotic analgesic meds: no Narcotic contract: no  Relevant past medical, surgical, family and social history reviewed and updated as indicated. Interim medical history since our last visit reviewed. Allergies and medications reviewed and updated.  Review of Systems  Constitutional:  Negative for activity change, appetite change, diaphoresis, fatigue and fever.  Respiratory:  Negative for cough, chest tightness, shortness of breath and wheezing.   Cardiovascular:  Negative for chest pain, palpitations and leg swelling.  Gastrointestinal: Negative.   Endocrine: Negative for cold intolerance, heat intolerance, polydipsia, polyphagia and polyuria.  Neurological: Negative.   Psychiatric/Behavioral: Negative.     Per HPI unless specifically indicated  above     Objective:    BP 136/74 (BP Location: Left Arm, Patient Position: Sitting, Cuff Size: Normal)   Pulse 70   Temp 98.4 F (36.9 C) (Oral)   Resp 17   Ht 6' 0.99 (1.854 m)   Wt 183 lb (83 kg)   SpO2 98%   BMI 24.15 kg/m   Wt Readings from Last 3 Encounters:  01/07/24 183 lb (83 kg)  12/13/23 185 lb 6.4 oz (84.1 kg)  10/17/23 186 lb 14.4 oz (84.8 kg)    Physical Exam Vitals and nursing note reviewed.  Constitutional:      General: He is awake. He is not in acute distress.    Appearance: Normal appearance. He is well-developed and well-groomed. He is not ill-appearing or toxic-appearing.     Comments: Gait stable with his cane support.  HENT:     Head: Normocephalic.     Right Ear: Hearing and external ear normal.     Left Ear: Hearing and external ear normal.     Mouth/Throat:     Lips: Pink.  Mouth: Mucous membranes are moist.     Comments: No swelling to lips noted. Eyes:     General: Lids are normal.     Extraocular Movements: Extraocular movements intact.     Conjunctiva/sclera: Conjunctivae normal.  Neck:     Thyroid : No thyromegaly.     Vascular: No carotid bruit.  Cardiovascular:     Rate and Rhythm: Normal rate. Rhythm irregularly irregular.     Heart sounds: Normal heart sounds. No murmur heard.    No gallop.  Pulmonary:     Effort: No accessory muscle usage or respiratory distress.     Breath sounds: Normal breath sounds.  Chest:  Breasts:    Right: Normal.     Left: Mass (no changes from previous exam, gynecomastia) present. No swelling or tenderness.  Abdominal:     General: Bowel sounds are normal. There is no distension.     Palpations: Abdomen is soft.     Tenderness: There is no abdominal tenderness.  Musculoskeletal:     Cervical back: Full passive range of motion without pain.     Right lower leg: No edema.     Left lower leg: No edema.  Lymphadenopathy:     Cervical: No cervical adenopathy.     Upper Body:     Right upper  body: No supraclavicular, axillary or pectoral adenopathy.     Left upper body: No supraclavicular, axillary or pectoral adenopathy.  Skin:    General: Skin is warm.     Capillary Refill: Capillary refill takes less than 2 seconds.  Neurological:     Mental Status: He is alert and oriented to person, place, and time.     Deep Tendon Reflexes: Reflexes are normal and symmetric.     Reflex Scores:      Brachioradialis reflexes are 2+ on the right side and 2+ on the left side.      Patellar reflexes are 2+ on the right side and 2+ on the left side. Psychiatric:        Attention and Perception: Attention normal.        Mood and Affect: Mood normal.        Speech: Speech normal.        Behavior: Behavior normal. Behavior is cooperative.        Thought Content: Thought content normal.    Results for orders placed or performed in visit on 01/07/24  Bayer DCA Hb A1c Waived   Collection Time: 01/07/24  1:59 PM  Result Value Ref Range   HB A1C (BAYER DCA - WAIVED) 7.0 (H) 4.8 - 5.6 %   *Note: Due to a large number of results and/or encounters for the requested time period, some results have not been displayed. A complete set of results can be found in Results Review.      Assessment & Plan:   Problem List Items Addressed This Visit       Cardiovascular and Mediastinum   Hypertension associated with diabetes (HCC)   Chronic, ongoing with A1c 7% today, same as previous check.  BP at goal today. Recommend he monitor BP at least a few mornings a week at home and document.  DASH diet at home.  Stopping Benazepril  due to intermittent lip swelling over past few months. Could consider ARB in future, trial. For now increase Amlodipine  to 10 MG nightly and will add on Hydralazine  to take BID as needed only if BP >140/90.  Educated him on this plan of care.  Labs today: CMP.        Relevant Medications   amLODipine  (NORVASC ) 10 MG tablet   hydrALAZINE  (APRESOLINE ) 25 MG tablet   Other Relevant  Orders   Bayer DCA Hb A1c Waived (Completed)   AF (paroxysmal atrial fibrillation) (HCC)   Chronic, ongoing.  Followed by cardiology at this time.  Continue current medication regimen and collaboration, appreciate their input.  HR with stable rate today.      Relevant Medications   amLODipine  (NORVASC ) 10 MG tablet   hydrALAZINE  (APRESOLINE ) 25 MG tablet     Respiratory   COPD (chronic obstructive pulmonary disease) (HCC)   Chronic, stable.  Followed by pulmonary at this time, continue this collaboration.  He is using Anoro without issue.  Reports some improvement in SOB, but continues to have this with heavier exertion.        Endocrine   Hyperlipidemia associated with type 2 diabetes mellitus (HCC)   Chronic, ongoing.  Continue current medication regimen, tolerating 5 MG Crestor  well without ADR, and adjust as needed.  Lipid panel today.      Relevant Medications   amLODipine  (NORVASC ) 10 MG tablet   hydrALAZINE  (APRESOLINE ) 25 MG tablet   Other Relevant Orders   Bayer DCA Hb A1c Waived (Completed)   Comprehensive metabolic panel with GFR   Lipid Panel w/o Chol/HDL Ratio   Diabetes mellitus with proteinuria (HCC) - Primary   Chronic, ongoing with A1c 7% today and urine ALB 150 (February 2025).  Significant decreased sensation bilateral feet, monitor closely for wounds and falls.  Continue current diabetes medication regimen and adjust as needed. He did not get benefit from Lyrica , Duloxetine, or Gabapentin  in past, will continue collaboration with neurology and pain clinic as needed.  Continue to monitor BS daily and document for visits. LABS: A1c and CMP. Does find benefit from performing physical therapy, but continues to struggle with discomfort. - Statin on board. Holding ACE due to lip swelling over past months on and off. - Vaccinations up to date - Foot and eye exams up to date      Relevant Orders   Bayer DCA Hb A1c Waived (Completed)   Diabetes mellitus with autonomic  neuropathy (HCC)   Chronic, ongoing with A1c 7% today and urine ALB 150 (February 2025).  Significant decreased sensation bilateral feet, monitor closely for wounds and falls.  Continue current diabetes medication regimen and adjust as needed. He did not get benefit from Lyrica , Duloxetine, or Gabapentin  in past, will continue collaboration with neurology and pain clinic as needed.  Continue to monitor BS daily and document for visits. LABS: A1c and CMP. Does find benefit from performing physical therapy, but continues to struggle with discomfort. - Statin on board. Holding ACE due to lip swelling over past months on and off. - Vaccinations up to date - Foot and eye exams up to date      Relevant Orders   Bayer DCA Hb A1c Waived (Completed)   Diabetes mellitus treated with oral medication (HCC)   Refer to diabetes with proteinuria plan of  care.      Relevant Orders   Bayer DCA Hb A1c Waived (Completed)     Genitourinary   CKD (chronic kidney disease) stage 3, GFR 30-59 ml/min (HCC)   Chronic, ongoing CKD 3a.  Continue to monitor closely and refer to nephrology if decline.  Stopping Benazepril  due to upper lip swelling. Consider trial of ARB in future.  Renal dose medications as needed based on  labs.  Would benefit from SGLT2, but refuses due to concern for increased urination which he already struggles with.        Hematopoietic and Hemostatic   Acquired thrombophilia   Patient on Eliquis  with A-fib, monitor CBC regularly.        Other   Lip swelling   Refer to HTN plan of care.      Gynecomastia, male   He feels left breast is worsening and requests repeat mammogram to check on this.      Relevant Orders   MM 3D DIAGNOSTIC MAMMOGRAM BILATERAL BREAST   Chronic pain syndrome   Chronic, ongoing.  Followed by neurosurgery and pain management as needed.  Continue this collaboration and current regimen. Lidocaine  patches present at home.      Other Visit Diagnoses       Flu  vaccine need       Flu vaccine today, educated patient.   Relevant Orders   Flu vaccine HIGH DOSE PF(Fluzone Trivalent) (Completed)        Follow up plan: Return in about 4 weeks (around 02/04/2024) for HTN - med changes and 3 months for , T2DM, HTN/HLD, COPD, PAIN.

## 2024-01-07 NOTE — Assessment & Plan Note (Signed)
 Chronic, ongoing with A1c 7% today, same as previous check.  BP at goal today. Recommend he monitor BP at least a few mornings a week at home and document.  DASH diet at home.  Stopping Benazepril  due to intermittent lip swelling over past few months. Could consider ARB in future, trial. For now increase Amlodipine  to 10 MG nightly and will add on Hydralazine  to take BID as needed only if BP >140/90.  Educated him on this plan of care.  Labs today: CMP.

## 2024-01-08 ENCOUNTER — Ambulatory Visit: Payer: Self-pay | Admitting: Nurse Practitioner

## 2024-01-08 ENCOUNTER — Other Ambulatory Visit: Payer: Self-pay | Admitting: Nurse Practitioner

## 2024-01-08 ENCOUNTER — Ambulatory Visit

## 2024-01-08 DIAGNOSIS — N62 Hypertrophy of breast: Secondary | ICD-10-CM

## 2024-01-08 DIAGNOSIS — N6320 Unspecified lump in the left breast, unspecified quadrant: Secondary | ICD-10-CM

## 2024-01-08 DIAGNOSIS — N6325 Unspecified lump in the left breast, overlapping quadrants: Secondary | ICD-10-CM

## 2024-01-08 LAB — LIPID PANEL W/O CHOL/HDL RATIO
Cholesterol, Total: 96 mg/dL — ABNORMAL LOW (ref 100–199)
HDL: 42 mg/dL (ref >39)
LDL Chol Calc (NIH): 29 mg/dL (ref 0–99)
Triglycerides: 148 mg/dL (ref 0–149)
VLDL Cholesterol Cal: 25 mg/dL (ref 5–40)

## 2024-01-08 LAB — COMPREHENSIVE METABOLIC PANEL WITH GFR
ALT: 12 IU/L (ref 0–44)
AST: 18 IU/L (ref 0–40)
Albumin: 4.3 g/dL (ref 3.7–4.7)
Alkaline Phosphatase: 70 IU/L (ref 48–129)
BUN/Creatinine Ratio: 20 (ref 10–24)
BUN: 23 mg/dL (ref 8–27)
Bilirubin Total: 0.5 mg/dL (ref 0.0–1.2)
CO2: 22 mmol/L (ref 20–29)
Calcium: 9.5 mg/dL (ref 8.6–10.2)
Chloride: 99 mmol/L (ref 96–106)
Creatinine, Ser: 1.14 mg/dL (ref 0.76–1.27)
Globulin, Total: 2.5 g/dL (ref 1.5–4.5)
Glucose: 152 mg/dL — ABNORMAL HIGH (ref 70–99)
Potassium: 4.1 mmol/L (ref 3.5–5.2)
Sodium: 138 mmol/L (ref 134–144)
Total Protein: 6.8 g/dL (ref 6.0–8.5)
eGFR: 65 mL/min/1.73 (ref >59)

## 2024-01-08 NOTE — Progress Notes (Signed)
 Contacted via MyChart  Good afternoon Cam, your labs have returned and overall look great. Kidney function is stable on this check. No medication changes needed. Any questions? Keep being amazing!!  Thank you for allowing me to participate in your care.  I appreciate you. Kindest regards, Rosland Riding

## 2024-01-09 NOTE — Progress Notes (Signed)
 Appt scheduled

## 2024-01-21 ENCOUNTER — Inpatient Hospital Stay
Admission: RE | Admit: 2024-01-21 | Discharge: 2024-01-21 | Attending: Nurse Practitioner | Admitting: Nurse Practitioner

## 2024-01-21 DIAGNOSIS — N62 Hypertrophy of breast: Secondary | ICD-10-CM

## 2024-01-21 DIAGNOSIS — N6325 Unspecified lump in the left breast, overlapping quadrants: Secondary | ICD-10-CM

## 2024-01-22 NOTE — Progress Notes (Signed)
 Contacted via MyChart  Good morning Sean Reyes, your mammogram continues to show gynecomastia. No changes from previous. This is a benign finding and often related to a medication, however I do not recommend changing any of your regimen at this time. We can continue to monitor. Any questions? Keep being amazing!!  Thank you for allowing me to participate in your care.  I appreciate you. Kindest regards, Samyra Limb

## 2024-01-27 NOTE — Progress Notes (Unsigned)
 [facility] [address] [place], [place] [address]  Pulmonary Sleep Medicine   Office Visit Note  Patient Name: Sean Reyes. DOB: [birth year] MRN [id number]    Chief Complaint: Obstructive Sleep Apnea visit  Brief History:  Sean Reyes is seen today for an annual follow up visit for APAP@ 5-14 cmH2O. The patient has a 15 year history of sleep apnea. Patient is using PAP nightly.  The patient feels rested after sleeping with PAP.  The patient reports benefiting from PAP use. Reported sleepiness is  improved and the Epworth Sleepiness Score is 8 out of 24. The patient will occasionally take naps. The patient complains of the following: some fatigue.  The compliance download shows 70% compliance with an average use time of 5 hours 22 minutes. The AHI is 5.2.  The patient does not complain of limb movements disrupting sleep. The patient continues to require PAP therapy in order to eliminate sleep apnea.   ROS  General: (-) fever, (-) chills, (-) night sweat Nose and Sinuses: (-) nasal stuffiness or itchiness, (-) postnasal drip, (-) nosebleeds, (-) sinus trouble. Mouth and Throat: (-) sore throat, (-) hoarseness. Neck: (-) swollen glands, (-) enlarged thyroid , (-) neck pain. Respiratory: - cough, - shortness of breath, - wheezing. Neurologic: - numbness, - tingling. Psychiatric: - anxiety, - depression   Current Medication: Outpatient Encounter Medications as of 01/28/2024  Medication Sig   amLODipine  (NORVASC ) 10 MG tablet Take 1 tablet (10 mg total) by mouth daily.   cyanocobalamin  1000 MCG tablet Take 1,000 mcg by mouth daily.   ELIQUIS  5 MG TABS tablet Take 1 tablet (5 mg total) by mouth 2 (two) times daily.   EPINEPHrine  0.3 mg/0.3 mL IJ SOAJ injection Inject 0.3 mg into the muscle as needed for anaphylaxis.   ferrous sulfate 325 (65 FE) MG EC tablet Take 325 mg by mouth daily with breakfast.   finasteride  (PROSCAR ) 5 MG tablet Take 1 tablet (5 mg total) by mouth  daily.   hydrALAZINE  (APRESOLINE ) 25 MG tablet Take 1 tablet (25 mg total) by mouth 2 (two) times daily as needed (For BP >140/90 as needed only).   metFORMIN  (GLUCOPHAGE ) 500 MG tablet Take 1 tablet (500 mg total) by mouth 2 (two) times daily with a meal.   omeprazole  (PRILOSEC) 40 MG capsule Take 40 mg by mouth 2 (two) times daily.   prednisoLONE acetate (PRED FORTE) 1 % ophthalmic suspension Place 1 drop into both eyes daily.   rosuvastatin  (CRESTOR ) 5 MG tablet Take 1 tablet (5 mg total) by mouth daily.   solifenacin  (VESICARE ) 5 MG tablet Take 1 tablet (5 mg total) by mouth daily.   tamsulosin  (FLOMAX ) 0.4 MG CAPS capsule Take 2 capsules (0.8 mg total) by mouth daily after supper.   umeclidinium-vilanterol (ANORO ELLIPTA ) 62.5-25 MCG/ACT AEPB Inhale 1 puff into the lungs daily.   No facility-administered encounter medications on file as of 01/28/2024.    Surgical History: Past Surgical History:  Procedure Laterality Date   ABLATION     ANTERIOR LAT LUMBAR FUSION N/A 06/27/2017   Procedure: Anterior Lateral Lumbar Interbody  Fusion - Lumbar Two-Lumbar Three - Lumbar Three-Lumbar Four, Posterior Lumbar Interbody Fusion Lumbar Four- Five;  Surgeon: Onetha Kuba, MD;  Location: [facility] OR;  Service: Neurosurgery;  Laterality: N/A;  Anterior Lateral Lumbar Interbody  Fusion - Lumbar Two-Lumbar Three - Lumbar Three-Lumbar Four, Posterior Lumbar Interbody Fusion Lumbar Four- Five   BACK SURGERY     CARDIAC CATHETERIZATION  CARDIOVERSION N/A 08/29/2018   Procedure: CARDIOVERSION;  Surgeon: Hester Wolm PARAS, MD;  Location: [facility] ORS;  Service: Cardiovascular;  Laterality: N/A;   CARDIOVERSION N/A 09/24/2018   Procedure: CARDIOVERSION;  Surgeon: Hester Wolm PARAS, MD;  Location: [facility] ORS;  Service: Cardiovascular;  Laterality: N/A;   COLONOSCOPY WITH PROPOFOL  N/A 10/05/2015   Procedure: COLONOSCOPY WITH PROPOFOL ;  Surgeon: Gladis RAYMOND Mariner, MD;  Location: [facility] ENDOSCOPY;  Service: Endoscopy;   Laterality: N/A;   COLONOSCOPY WITH PROPOFOL  N/A 11/01/2020   Procedure: COLONOSCOPY WITH PROPOFOL ;  Surgeon: Therisa Bi, MD;  Location: [facility] ENDOSCOPY;  Service: Gastroenterology;  Laterality: N/A;   CORONARY STENT INTERVENTION N/A 12/22/2020   Procedure: CORONARY STENT INTERVENTION;  Surgeon: Ammon Blunt, MD;  Location: [facility] INVASIVE CV LAB;  Service: Cardiovascular;  Laterality: N/A;   ESOPHAGOGASTRODUODENOSCOPY N/A 11/01/2020   Procedure: ESOPHAGOGASTRODUODENOSCOPY (EGD);  Surgeon: Therisa Bi, MD;  Location: [facility] ENDOSCOPY;  Service: Gastroenterology;  Laterality: N/A;   ESOPHAGOGASTRODUODENOSCOPY (EGD) WITH PROPOFOL  N/A 04/01/2018   Procedure: ESOPHAGOGASTRODUODENOSCOPY (EGD) WITH PROPOFOL ;  Surgeon: Mariner Gladis RAYMOND, MD;  Location: [facility] ENDOSCOPY;  Service: Endoscopy;  Laterality: N/A;   ESOPHAGOGASTRODUODENOSCOPY (EGD) WITH PROPOFOL  N/A 06/01/2022   Procedure: ESOPHAGOGASTRODUODENOSCOPY (EGD) WITH PROPOFOL ;  Surgeon: Therisa Bi, MD;  Location: [facility] ENDOSCOPY;  Service: Gastroenterology;  Laterality: N/A;   EYE SURGERY     HERNIA REPAIR     JOINT REPLACEMENT Bilateral    hips  RT+  LEFT X2    LEFT HEART CATH AND CORONARY ANGIOGRAPHY N/A 12/22/2020   Procedure: LEFT HEART CATH AND CORONARY ANGIOGRAPHY;  Surgeon: Ammon Blunt, MD;  Location: [facility] INVASIVE CV LAB;  Service: Cardiovascular;  Laterality: N/A;   LEFT HEART CATH AND CORONARY ANGIOGRAPHY Left 08/23/2021   Procedure: LEFT HEART CATH AND CORONARY ANGIOGRAPHY;  Surgeon: Hester Wolm PARAS, MD;  Location: [facility] INVASIVE CV LAB;  Service: Cardiovascular;  Laterality: Left;   LUMBAR LAMINECTOMY/DECOMPRESSION MICRODISCECTOMY Left 09/13/2016   Procedure: Microdiscectomy - Lumbar two-three,  Lumbar three- - left;  Surgeon: Onetha Kuba, MD;  Location: [facility] OR;  Service: Neurosurgery;  Laterality: Left;   SPINAL CORD STIMULATOR INSERTION  07/08/2019   TONSILLECTOMY      Medical History: Past Medical History:  Diagnosis  Date   Anemia    Anxiety    Arthritis    Arthritis of neck    Atrial fibrillation (HCC)    Cataracts, bilateral    Complication of anesthesia    pt reports low BP's after surgery at [facility] and difficulty awakening   Depression    Diabetes (HCC)    dx 6-8 yrs ago   Dysrhythmia    a-fib   GERD (gastroesophageal reflux disease)    OCC TAKES ALKA SELTZER   History of kidney stones    10-15 yrs ago   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   Hyperlipidemia    Hypertension    Myocardial infarction (HCC) 12/2020   Nocturia    S/P ablation of atrial fibrillation    Ablative therapy   Sleep apnea    CPAP    Spinal stenosis    Tachycardia, unspecified     Family History: Non contributory to the present illness  Social History: Social History   Socioeconomic History   Marital status: Married    Spouse name: Channing    Number of children: 2   Years of education: Not on file   Highest education level: High school graduate  Occupational History   Occupation: retired  Tobacco Use   Smoking status: Never   Smokeless tobacco: Never  Vaping Use   Vaping status: Never Used  Substance and Sexual Activity   Alcohol use: No   Drug use: No   Sexual activity: Not on file  Other Topics Concern   Not on file  Social History Narrative   MarriedGets regular exercise.      Lives in [place] with wife/ daughter. Never smoked; no alcohol. 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    Social Drivers of Health   Tobacco Use: Low Risk (01/07/2024)   Patient History    Smoking Tobacco Use: Never    Smokeless Tobacco Use: Never    Passive Exposure: Not on file  Financial Resource Strain: Low Risk (09/19/2022)   Overall Financial Resource Strain (CARDIA)    Difficulty of Paying Living Expenses: Not hard at all  Food Insecurity: No Food Insecurity (09/19/2022)   Hunger Vital Sign    Worried About Running Out of Food in the Last Year:  Never true    Ran Out of Food in the Last Year: Never true  Transportation Needs: No Transportation Needs (09/19/2022)   PRAPARE - Administrator, Civil Service (Medical): No    Lack of Transportation (Non-Medical): No  Physical Activity: Insufficiently Active (11/23/2022)   Exercise Vital Sign    Days of Exercise per Week: 2 days    Minutes of Exercise per Session: 30 min  Stress: No Stress Concern Present (09/19/2022)   [employer] of Occupational Health - Occupational Stress Questionnaire    Feeling of Stress : Not at all  Social Connections: Socially Integrated (09/19/2022)   Social Connection and Isolation Panel    Frequency of Communication with Friends and Family: More than three times a week    Frequency of Social Gatherings with Friends and Family: Twice a week    Attends Religious Services: More than 4 times per year    Active Member of [employer] or Organizations: Yes    Attends Engineer, Structural: More than 4 times per year    Marital Status: Married  Catering Manager Violence: Not At Risk (09/19/2022)   Humiliation, Afraid, Rape, and Kick questionnaire    Fear of Current or Ex-Partner: No    Emotionally Abused: No    Physically Abused: No    Sexually Abused: No  Depression (PHQ2-9): Low Risk (01/07/2024)   Depression (PHQ2-9)    PHQ-2 Score: 0  Alcohol Screen: Low Risk (09/19/2022)   Alcohol Screen    Last Alcohol Screening Score (AUDIT): 0  Housing: Unknown (04/24/2023)   Received from [facility]   Epic    Unable to Pay for Housing in the Last Year: Not on file    Number of Times Moved in the Last Year: Not on file    At any time in the past 12 months, were you homeless or living in a shelter (including now)?: No  Utilities: Not At Risk (09/19/2022)   AHC Utilities    Threatened with loss of utilities: No  Health Literacy: Adequate Health Literacy (09/19/2022)   B1300 Health Literacy    Frequency of need for help with medical  instructions: Never    Vital Signs: There were no vitals taken for this visit. There is no height or weight on file to calculate BMI.    Examination: General Appearance: The patient is well-developed, well-nourished, and in no distress. Neck Circumference: 41 cm Skin: Gross inspection of  skin unremarkable. Head: normocephalic, no gross deformities. Eyes: no gross deformities noted. ENT: ears appear grossly normal Neurologic: Alert and oriented. No involuntary movements.  STOP BANG RISK ASSESSMENT S (snore) Have you been told that you snore?     NO   T (tired) Are you often tired, fatigued, or sleepy during the day?   YES  O (obstruction) Do you stop breathing, choke, or gasp during sleep? NO   P (pressure) Do you have or are you being treated for high blood pressure? YES   B (BMI) Is your body index greater than 35 kg/m? NO   A (age) Are you 66 years old or older? YES   N (neck) Do you have a neck circumference greater than 16 inches?   YES   G (gender) Are you a male? YES   TOTAL STOP/BANG YES ANSWERS 5       A STOP-Bang score of 2 or less is considered low risk, and a score of 5 or more is high risk for having either moderate or severe OSA. For people who score 3 or 4, doctors may need to perform further assessment to determine how likely they are to have OSA.         EPWORTH SLEEPINESS SCALE:  Scale:  (0)= no chance of dozing; (1)= slight chance of dozing; (2)= moderate chance of dozing; (3)= high chance of dozing  Chance  Situtation    Sitting and reading: 1    Watching TV: 1    Sitting Inactive in public: 0    As a passenger in car: 2      Lying down to rest: 3    Sitting and talking: 0    Sitting quielty after lunch: 1    In a car, stopped in traffic: 0   TOTAL SCORE:   8 out of 24    SLEEP STUDIES:  HST (04/2021) AHI 14/hr, min SpO2 83% Titration (04/2008) APAP@ 5-20 cmH2O   CPAP COMPLIANCE DATA:  Date Range:  01/24/2023-01/23/2024  Average Daily Use: 5 hours 22 minutes  Median Use: 5 hours 24 minutes  Compliance for > 4 Hours: 70%  AHI: 5.2 respiratory events per hour  Days Used: 356/365 days  Mask Leak: 6.1  95th Percentile Pressure: 11.4         LABS: Recent Results (from the past 2160 hours)  Bayer DCA Hb A1c Waived     Status: Abnormal   Collection Time: 01/07/24  1:59 PM  Result Value Ref Range   HB A1C (BAYER DCA - WAIVED) 7.0 (H) 4.8 - 5.6 %    Comment:          Prediabetes: 5.7 - 6.4          Diabetes: >6.4          Glycemic control for adults with diabetes: <7.0   Comprehensive metabolic panel with GFR     Status: Abnormal   Collection Time: 01/07/24  2:00 PM  Result Value Ref Range   Glucose 152 (H) 70 - 99 mg/dL   BUN 23 8 - 27 mg/dL   Creatinine, Ser 8.85 0.76 - 1.27 mg/dL   eGFR 65 >40 fO/fpw/8.26   BUN/Creatinine Ratio 20 10 - 24   Sodium 138 134 - 144 mmol/L   Potassium 4.1 3.5 - 5.2 mmol/L   Chloride 99 96 - 106 mmol/L   CO2 22 20 - 29 mmol/L   Calcium  9.5 8.6 - 10.2 mg/dL   Total Protein 6.8 6.0 -  8.5 g/dL   Albumin 4.3 3.7 - 4.7 g/dL   Globulin, Total 2.5 1.5 - 4.5 g/dL   Bilirubin Total 0.5 0.0 - 1.2 mg/dL   Alkaline Phosphatase 70 48 - 129 IU/L   AST 18 0 - 40 IU/L   ALT 12 0 - 44 IU/L  Lipid Panel w/o Chol/HDL Ratio     Status: Abnormal   Collection Time: 01/07/24  2:00 PM  Result Value Ref Range   Cholesterol, Total 96 (L) 100 - 199 mg/dL   Triglycerides 851 0 - 149 mg/dL   HDL 42 >60 mg/dL   VLDL Cholesterol Cal 25 5 - 40 mg/dL   LDL Chol Calc (NIH) 29 0 - 99 mg/dL    Radiology: MM 3D DIAGNOSTIC MAMMOGRAM BILATERAL BREAST Result Date: 01/21/2024 CLINICAL DATA:  History of gynecomastia, feels like it is gotten worse on the LEFT since last year's examination EXAM: DIGITAL DIAGNOSTIC BILATERAL MAMMOGRAM WITH TOMOSYNTHESIS AND CAD TECHNIQUE: Bilateral digital diagnostic mammography and breast tomosynthesis was performed. The images were  evaluated with computer-aided detection. COMPARISON:  Previous exam(s). ACR Breast Density Category b: There are scattered areas of fibroglandular density. FINDINGS: LEFT-greater-than-RIGHT RETROAREOLAR flame shaped densities compatible with benign gynecomastia, not substantially changed from 2024. The gynecomastia correlates with the palpable abnormality in the LEFT breast. There is no mammographic evidence of malignancy in EITHER breast. IMPRESSION: Benign gynecomastia, unchanged. Gynecomastia is common and can occur with changes in the testosterone :estrogen ratio. Potential causes of gynecomastia include numerous prescription medications, dietary supplements, anabolic steroids, and recreational drugs, particularly marijuana. Other causes include hormone secreting testicular or pituitary tumors. Gynecomastia can be related to other medical problems, such as kidney, thyroid , or liver disease. Gynecomastia often resolves on its own. RECOMMENDATION: Clinical follow-up to evaluate for potentially reversible causes of benign gynecomastia. No specific imaging follow-up is needed. I have discussed the findings and recommendations with the patient. If applicable, a reminder letter will be sent to the patient regarding the next appointment. BI-RADS CATEGORY  2: Benign. Electronically Signed   By: Norleen Croak M.D.   On: 01/21/2024 15:00    No results found.  MM 3D DIAGNOSTIC MAMMOGRAM BILATERAL BREAST Result Date: 01/21/2024 CLINICAL DATA:  History of gynecomastia, feels like it is gotten worse on the LEFT since last year's examination EXAM: DIGITAL DIAGNOSTIC BILATERAL MAMMOGRAM WITH TOMOSYNTHESIS AND CAD TECHNIQUE: Bilateral digital diagnostic mammography and breast tomosynthesis was performed. The images were evaluated with computer-aided detection. COMPARISON:  Previous exam(s). ACR Breast Density Category b: There are scattered areas of fibroglandular density. FINDINGS: LEFT-greater-than-RIGHT RETROAREOLAR flame  shaped densities compatible with benign gynecomastia, not substantially changed from 2024. The gynecomastia correlates with the palpable abnormality in the LEFT breast. There is no mammographic evidence of malignancy in EITHER breast. IMPRESSION: Benign gynecomastia, unchanged. Gynecomastia is common and can occur with changes in the testosterone :estrogen ratio. Potential causes of gynecomastia include numerous prescription medications, dietary supplements, anabolic steroids, and recreational drugs, particularly marijuana. Other causes include hormone secreting testicular or pituitary tumors. Gynecomastia can be related to other medical problems, such as kidney, thyroid , or liver disease. Gynecomastia often resolves on its own. RECOMMENDATION: Clinical follow-up to evaluate for potentially reversible causes of benign gynecomastia. No specific imaging follow-up is needed. I have discussed the findings and recommendations with the patient. If applicable, a reminder letter will be sent to the patient regarding the next appointment. BI-RADS CATEGORY  2: Benign. Electronically Signed   By: Norleen Croak M.D.   On: 01/21/2024 15:00  Assessment and Plan: Patient Active Problem List   Diagnosis Date Noted   Gynecomastia, male 01/07/2024   Lip swelling 07/04/2023   Diabetes mellitus treated with oral medication (HCC) 04/05/2023   COPD (chronic obstructive pulmonary disease) (HCC) 10/31/2021   Shortness of breath on exertion 08/04/2021   Diabetes mellitus with proteinuria (HCC) 04/04/2021   GERD without esophagitis 04/02/2021   Coronary artery disease 12/28/2020   History of non-ST elevation myocardial infarction (NSTEMI) 12/21/2020   Pain in right shin 10/25/2020   Peripheral vascular disease 08/06/2020   Chronic pain syndrome 07/06/2020   Cervical facet joint syndrome 04/08/2020   Spinal cord stimulator status 12/11/2019   Elevated TSH 08/20/2019   CKD (chronic kidney disease) stage 3, GFR 30-59  ml/min (HCC) 01/19/2019   Acquired thrombophilia 01/19/2019   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   Postlaminectomy syndrome, lumbar region 01/16/2019   History of fusion of lumbar spine (L2-L5) 01/16/2019   Chronic radicular lumbar pain 01/16/2019   HNP (herniated nucleus pulposus), lumbar 04/29/2018   Advanced care planning/counseling discussion 09/28/2016   Spinal stenosis, lumbar region, 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/13/2016   Hyperlipidemia associated with type 2 diabetes mellitus (HCC) 07/14/2015   Symptomatic anemia 06/30/2015   Benign prostatic hyperplasia without lower urinary tract symptoms 06/02/2015   OSA (obstructive sleep apnea) 03/23/2015   Hypertension associated with diabetes (HCC) 09/28/2014   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HCC) 09/28/2014   H/O prior ablation treatment 10/19/2011   AF (paroxysmal atrial fibrillation) (HCC) 10/19/2011   1. OSA (obstructive sleep apnea) (Primary) The patient does tolerate PAP and reports  benefit from PAP use. His apnea is not optimally controlled- some nights he is having an AHI over 20. 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 He has an adjustable bed so will adjust it for his head to be elevated, and we will adjust his pressure to 5-15 with a 2 week download. The patient was reminded how to clean equipment and advised to replace supplies routinely. The patient was also counselled on weight loss. The compliance is excellent. The AHI is 5.2.   OSA- not optimally controlled. Change to 5-15. F/u download in 2 weeks. F/u 51m  2. CPAP use counseling CPAP Counseling: had a lengthy discussion with the patient regarding the importance of PAP therapy in management of the sleep apnea. Patient appears to understand the risk factor reduction and also understands the risks associated with untreated sleep apnea. Patient will try to make a good faith effort to remain compliant with therapy. Also  instructed the patient on proper cleaning of the device including the water must be changed daily if possible and use of distilled water is preferred. Patient understands that the machine should be regularly cleaned with appropriate recommended cleaning solutions that do not damage the PAP machine for example given white vinegar and water rinses. Other methods such as ozone treatment may not be as good as these simple methods to achieve cleaning.   3. AF (paroxysmal atrial fibrillation) (HCC) On eliquis , continue and follow up with cardiology.      General Counseling: I have discussed the findings of the evaluation and examination with Sean Reyes.  I have also discussed any further diagnostic evaluation thatmay be needed or ordered today. Sean Reyes verbalizes understanding of the findings of todays visit. We also reviewed his medications today and discussed drug interactions and side effects including but not limited excessive drowsiness and altered mental states. We  also discussed that there is always a risk not just to him but also people around him. he has been encouraged to call the office with any questions or concerns that should arise related to todays visit.  No orders of the defined types were placed in this encounter.       I have personally obtained a history, examined the patient, evaluated laboratory and imaging results, formulated the assessment and plan and placed orders. This patient was seen today by Lauraine Lay, PA-C in collaboration with Dr. Elfreda Bathe.   Elfreda DELENA Bathe, MD [facility] Diplomate ABMS Pulmonary Critical Care Medicine and Sleep Medicine

## 2024-01-28 ENCOUNTER — Ambulatory Visit: Admitting: Internal Medicine

## 2024-01-28 VITALS — BP 141/72 | HR 68 | Resp 16 | Ht 72.0 in | Wt 190.0 lb

## 2024-01-28 DIAGNOSIS — Z7189 Other specified counseling: Secondary | ICD-10-CM | POA: Diagnosis not present

## 2024-01-28 DIAGNOSIS — I480 Paroxysmal atrial fibrillation: Secondary | ICD-10-CM | POA: Diagnosis not present

## 2024-01-28 DIAGNOSIS — G4733 Obstructive sleep apnea (adult) (pediatric): Secondary | ICD-10-CM

## 2024-01-28 NOTE — Patient Instructions (Signed)
 CPAP and BIPAP Information CPAP and BIPAP use air pressure to keep your airways open and help you breathe well. CPAP and BIPAP use different amounts of pressure. Your health care provider will tell you whether CPAP or BIPAP would be best for you. CPAP stands for continuous positive airway pressure. With CPAP, the amount of pressure stays the same while you breathe in and out. BIPAP stands for bi-level positive airway pressure. With BIPAP, the amount of pressure will be higher when you breathe in and lower when you breathe out. This allows you to take bigger breaths. CPAP or BIPAP may be used in the hospital or at home. You may need to have a sleep study before your provider can order a device for you to use at home. What are the advantages? CPAP and BIPAP are most often used for obstructive sleep apnea to keep the airways from collapsing when the muscles relax during sleep. CPAP or BIPAP can be used if you have: Chronic obstructive pulmonary disease. Heart failure. Medical conditions that cause muscle weakness. Other problems that cause breathing to be shallow, weak, or difficult. What are the risks? Your provider will talk with you about risks. These may include: Sores on your nose or face caused from the mask, prongs, or nasal pillows. Dry or stuffy nose or nosebleeds. Feeling gassy or bloated. Sinus or lung infection if the equipment is not cleaned well. When should CPAP or BIPAP be used? In most cases, CPAP or BIPAP is used during sleep at night or whenever the main sleep time happens. It's also used during naps. People with some medical conditions may need to wear the mask when they're awake. Follow instructions from your provider about when to use your CPAP or BIPAP. What happens during CPAP or BIPAP?  Both CPAP and BIPAP use a small machine that uses electricity to create air pressure. A long tube connects the device to a plastic mask. Air is blown through the mask into your nose or  mouth. The amount of pressure that's used to blow the air can be adjusted. Your provider will set the pressure setting and help you find the best mask for you. Tips for using the mask There are different types and sizes of masks. If your mask does not fit well, talk with your provider about getting a different one. Some common types of masks include: Full face masks, which fit over the mouth and nose. Nasal masks, which fit over the nose. Nasal pillow or prong masks, which fit into the nostrils. The mask needs to be snug to your face, so some people feel trapped or closed in at first. If you feel this way, you may need to get used to the mask. Hold the mask loosely over your nose or mouth and then gradually put the the mask on more snugly. Slowly increase the amount of time you use the mask. If you have trouble with your mask not fitting well or leaking, talk with your provider. Do not stop using the mask. Tips for using the device Follow instructions from your provider about how to and how often to use the device. For home use, CPAP and BIPAP devices come from home health care companies. There are many different brands. Your health insurance company will help to decide which device you get. Keep the CPAP or BIPAP device and attachments clean. Ask your home health care company or check the instruction book for cleaning instructions. Make sure the humidifier is filled with germ-free (sterile)  water and is working correctly. This will help prevent a dry or stuffy nose or nosebleeds. A nasal saline mist or spray may keep your nose from getting dry and sore. Do not eat or drink while the CPAP or BIPAP device is on. Food or drinks could get pushed into your lungs by the pressure of the CPAP or BIPAP. Follow these instructions at home: Take over-the-counter and prescription medicines only as told by your provider. Do not smoke, vape, or use nicotine or tobacco. Contact a health care provider if: You  have redness or pressure sores on your head, face, mouth, or nose from the mask or headgear. You have trouble using the CPAP or BIPAP device. You have trouble going to sleep or staying asleep. Someone tells you that you snore even when wearing your CPAP or BIPAP device. Get help right away if: You have trouble breathing. You feel confused. These symptoms may be an emergency. Get help right away. Call 911. Do not wait to see if the symptoms will go away. Do not drive yourself to the hospital. This information is not intended to replace advice given to you by your health care provider. Make sure you discuss any questions you have with your health care provider. Document Revised: 05/24/2022 Document Reviewed: 05/24/2022 Elsevier Patient Education  2024 [employer].

## 2024-02-02 NOTE — Patient Instructions (Incomplete)
 Do not take Amlodipine  10 MG twice a day, take only once a day at night. If BP >140/90 consistently then start taking Hydralazine  25 MG every morning, can take twice a day if needed dependent on your blood pressure at home.  Be Involved in Caring For Your Health:  Taking Medications When medications are taken as directed, they can greatly improve your health. But if they are not taken as prescribed, they may not work. In some cases, not taking them correctly can be harmful. To help ensure your treatment remains effective and safe, understand your medications and how to take them. Bring your medications to each visit for review by your provider.  Your lab results, notes, and after visit summary will be available on My Chart. We strongly encourage you to use this feature. If lab results are abnormal the clinic will contact you with the appropriate steps. If the clinic does not contact you assume the results are satisfactory. You can always view your results on My Chart. If you have questions regarding your health or results, please contact the clinic during office hours. You can also ask questions on My Chart.  We at [facility] are grateful that you chose us  to provide your care. We strive to provide evidence-based and compassionate care and are always looking for feedback. If you get a survey from the clinic please complete this so we can hear your opinions.   DASH Eating Plan DASH stands for Dietary Approaches to Stop Hypertension. The DASH eating plan is a healthy eating plan that has been shown to: Lower high blood pressure (hypertension). Reduce your risk for type 2 diabetes, heart disease, and stroke. Help with weight loss. What are tips for following this plan? Reading food labels Check food labels for the amount of salt (sodium) per serving. Choose foods with less than 5 percent of the Daily Value (DV) of sodium. In general, foods with less than 300 milligrams (mg) of sodium per  serving fit into this eating plan. To find whole grains, look for the word whole as the first word in the ingredient list. Shopping Buy products labeled as low-sodium or no salt added. Buy fresh foods. Avoid canned foods and pre-made or frozen meals. Cooking Try not to add salt when you cook. Use salt-free seasonings or herbs instead of table salt or sea salt. Check with your health care provider or pharmacist before using salt substitutes. Do not fry foods. Cook foods in healthy ways, such as baking, boiling, grilling, roasting, or broiling. Cook using oils that are good for your heart. These include olive, canola, avocado, soybean, and sunflower oil. Meal planning  Eat a balanced diet. This should include: 4 or more servings of fruits and 4 or more servings of vegetables each day. Try to fill half of your plate with fruits and vegetables. 6-8 servings of whole grains each day. 6 or less servings of lean meat, poultry, or fish each day. 1 oz is 1 serving. A 3 oz (85 g) serving of meat is about the same size as the palm of your hand. One egg is 1 oz (28 g). 2-3 servings of low-fat dairy each day. One serving is 1 cup (237 mL). 1 serving of nuts, seeds, or beans 5 times each week. 2-3 servings of heart-healthy fats. Healthy fats called omega-3 fatty acids are found in foods such as walnuts, flaxseeds, fortified milks, and eggs. These fats are also found in cold-water fish, such as sardines, salmon, and mackerel. Limit  how much you eat of: Canned or prepackaged foods. Food that is high in trans fat, such as fried foods. Food that is high in saturated fat, such as fatty meat. Desserts and other sweets, sugary drinks, and other foods with added sugar. Full-fat dairy products. Do not salt foods before eating. Do not eat more than 4 egg yolks a week. Try to eat at least 2 vegetarian meals a week. Eat more home-cooked food and less restaurant, buffet, and fast food. Lifestyle When eating  at a restaurant, ask if your food can be made with less salt or no salt. If you drink alcohol: Limit how much you have to: 0-1 drink a day if you are male. 0-2 drinks a day if you are male. Know how much alcohol is in your drink. In the [place] one drink is one 12 oz bottle of beer (355 mL), one 5 oz glass of wine (148 mL), or one 1 oz glass of hard liquor (44 mL). General information Avoid eating more than 2,300 mg of salt a day. If you have hypertension, you may need to reduce your sodium intake to 1,500 mg a day. Work with your provider to stay at a healthy body weight or lose weight. Ask what the best weight range is for you. On most days of the week, get at least 30 minutes of exercise that causes your heart to beat faster. This may include walking, swimming, or biking. Work with your provider or dietitian to adjust your eating plan to meet your specific calorie needs. What foods should I eat? Fruits All fresh, dried, or frozen fruit. Canned fruits that are in their natural juice and do not have sugar added to them. Vegetables Fresh or frozen vegetables that are raw, steamed, roasted, or grilled. Low-sodium or reduced-sodium tomato and vegetable juice. Low-sodium or reduced-sodium tomato sauce and tomato paste. Low-sodium or reduced-sodium canned vegetables. Grains Whole-grain or whole-wheat bread. Whole-grain or whole-wheat pasta. Brown rice. [place]neil Madeira. Bulgur. Whole-grain and low-sodium cereals. Pita bread. Low-fat, low-sodium crackers. Whole-wheat flour tortillas. Meats and other proteins Skinless chicken or turkey. Ground chicken or turkey. Pork with fat trimmed off. Fish and seafood. Egg whites. Dried beans, peas, or lentils. Unsalted nuts, nut butters, and seeds. Unsalted canned beans. Lean cuts of beef with fat trimmed off. Low-sodium, lean precooked or cured meat, such as sausages or meat loaves. Dairy Low-fat (1%) or fat-free (skim) milk. Reduced-fat, low-fat, or fat-free  cheeses. Nonfat, low-sodium ricotta or cottage cheese. Low-fat or nonfat yogurt. Low-fat, low-sodium cheese. Fats and oils Soft margarine without trans fats. Vegetable oil. Reduced-fat, low-fat, or light mayonnaise and salad dressings (reduced-sodium). Canola, safflower, olive, avocado, soybean, and sunflower oils. Avocado. Seasonings and condiments Herbs. Spices. Seasoning mixes without salt. Other foods Unsalted popcorn and pretzels. Fat-free sweets. The items listed above may not be all the foods and drinks you can have. Talk to a dietitian to learn more. What foods should I avoid? Fruits Canned fruit in a light or heavy syrup. Fried fruit. Fruit in cream or butter sauce. Vegetables Creamed or fried vegetables. Vegetables in a cheese sauce. Regular canned vegetables that are not marked as low-sodium or reduced-sodium. Regular canned tomato sauce and paste that are not marked as low-sodium or reduced-sodium. Regular tomato and vegetable juices that are not marked as low-sodium or reduced-sodium. Dene. Olives. Grains Baked goods made with fat, such as croissants, muffins, or some breads. Dry pasta or rice meal packs. Meats and other proteins Fatty cuts of meat. Ribs.  Fried meat. [place]. Bologna, salami, and other precooked or cured meats, such as sausages or meat loaves, that are not lean and low in sodium. Fat from the back of a pig (fatback). Bratwurst. Salted nuts and seeds. Canned beans with added salt. Canned or smoked fish. Whole eggs or egg yolks. Chicken or turkey with skin. Dairy Whole or 2% milk, cream, and half-and-half. Whole or full-fat cream cheese. Whole-fat or sweetened yogurt. Full-fat cheese. Nondairy creamers. Whipped toppings. Processed cheese and cheese spreads. Fats and oils Butter. Stick margarine. Lard. Shortening. Ghee. Bacon fat. Tropical oils, such as coconut, palm kernel, or palm oil. Seasonings and condiments Onion salt, garlic salt, seasoned salt, table salt,  and sea salt. Worcestershire sauce. Tartar sauce. Barbecue sauce. Teriyaki sauce. Soy sauce, including reduced-sodium soy sauce. Steak sauce. Canned and packaged gravies. Fish sauce. Oyster sauce. Cocktail sauce. Store-bought horseradish. Ketchup. Mustard. Meat flavorings and tenderizers. Bouillon cubes. Hot sauces. Pre-made or packaged marinades. Pre-made or packaged taco seasonings. Relishes. Regular salad dressings. Other foods Salted popcorn and pretzels. The items listed above may not be all the foods and drinks you should avoid. Talk to a dietitian to learn more. Where to find more information National Heart, Lung, and Blood Institute (NHLBI): buffalodrycleaner.gl American Heart Association (AHA): heart.org Academy of Nutrition and Dietetics: eatright.org National Kidney Foundation (NKF): kidney.org This information is not intended to replace advice given to you by your health care provider. Make sure you discuss any questions you have with your health care provider. Document Revised: 02/16/2022 Document Reviewed: 02/16/2022 Elsevier Patient Education  2024 [employer].

## 2024-02-04 ENCOUNTER — Ambulatory Visit (INDEPENDENT_AMBULATORY_CARE_PROVIDER_SITE_OTHER): Admitting: Nurse Practitioner

## 2024-02-04 ENCOUNTER — Encounter: Payer: Self-pay | Admitting: Nurse Practitioner

## 2024-02-04 VITALS — BP 134/65 | HR 74 | Temp 98.2°F | Resp 14 | Ht 72.01 in | Wt 189.2 lb

## 2024-02-04 DIAGNOSIS — Z7984 Long term (current) use of oral hypoglycemic drugs: Secondary | ICD-10-CM

## 2024-02-04 DIAGNOSIS — I152 Hypertension secondary to endocrine disorders: Secondary | ICD-10-CM | POA: Diagnosis not present

## 2024-02-04 DIAGNOSIS — E1159 Type 2 diabetes mellitus with other circulatory complications: Secondary | ICD-10-CM | POA: Diagnosis not present

## 2024-02-04 MED ORDER — AMLODIPINE BESYLATE 10 MG PO TABS: 10.0000 mg | ORAL_TABLET | Freq: Every day | ORAL | Status: DC

## 2024-02-04 NOTE — Assessment & Plan Note (Signed)
 Chronic, ongoing with A1c 7% recent check, same as previous check.  BP at goal today. Recommend he monitor BP at least a few mornings a week at home and document.  DASH diet at home.  Will continue off Benazepril  as lip swelling improving, concern this may have been cause -- added to allergy list. Could consider ARB in future, trial. For now continue Amlodipine  10 MG nightly (discussed at length with him to take only daily as on bottle and not twice a day as he was -- suspect this was leading to his edema due to taking over the amount ordered) and continue Hydralazine  to take BID as needed only if BP >140/90.  Educated him at length on this plan of care.  Labs today: up to date.

## 2024-02-04 NOTE — Progress Notes (Signed)
 "  BP 134/65 (BP Location: Left Arm, Patient Position: Sitting, Cuff Size: Normal)   Pulse 74   Temp 98.2 F (36.8 C) (Oral)   Resp 14   Ht 6' 0.01 (1.829 m)   Wt 189 lb 3.2 oz (85.8 kg)   SpO2 98%   BMI 25.65 kg/m    Subjective:    Patient ID: Sean Reyes., male    DOB: 1940-07-17, 83 y.o.   MRN: [id number]  HPI: Sean Reyes. is a 83 y.o. male  Chief Complaint  Patient presents with   Hypertension    BP is doing ok but both feet are swelling. Left is worst. Does elevate when he can.    HYPERTENSION with Chronic Kidney Disease Stopped Benazepril  on 01/07/24 due to episodes of lip swelling. He feels the lip swelling is improving, has had a few mild episodes. Increased Amlodipine  to 10 MG daily and added on Hydralazine  PRN (has not taken this).  He reports he was taking Amlodipine  10 MG twice a day and not once a day -- having swelling. Hypertension status: stable  Satisfied with current treatment? yes Duration of hypertension: chronic BP monitoring frequency:  daily BP range:  123/76 to 179/99 -- on average <130/80 BP medication side effects:  yes Medication compliance: good compliance Aspirin : no - Eliquis  Recurrent headaches: no Visual changes: no Palpitations: no Dyspnea: no Chest pain: no Lower extremity edema: yes due to Amlodipine  Dizzy/lightheaded: no   Relevant past medical, surgical, family and social history reviewed and updated as indicated. Interim medical history since our last visit reviewed. Allergies and medications reviewed and updated.  Review of Systems  Constitutional:  Negative for activity change, appetite change, diaphoresis, fatigue and fever.  Respiratory:  Negative for cough, chest tightness, shortness of breath and wheezing.   Cardiovascular:  Positive for leg swelling. Negative for chest pain and palpitations.  Gastrointestinal: Negative.   Endocrine: Negative for cold intolerance, heat intolerance, polydipsia, polyphagia and  polyuria.  Neurological: Negative.   Psychiatric/Behavioral: Negative.     Per HPI unless specifically indicated above     Objective:    BP 134/65 (BP Location: Left Arm, Patient Position: Sitting, Cuff Size: Normal)   Pulse 74   Temp 98.2 F (36.8 C) (Oral)   Resp 14   Ht 6' 0.01 (1.829 m)   Wt 189 lb 3.2 oz (85.8 kg)   SpO2 98%   BMI 25.65 kg/m   Wt Readings from Last 3 Encounters:  02/04/24 189 lb 3.2 oz (85.8 kg)  01/28/24 190 lb (86.2 kg)  01/07/24 183 lb (83 kg)    Physical Exam Vitals and nursing note reviewed.  Constitutional:      General: He is awake. He is not in acute distress.    Appearance: Normal appearance. He is well-developed and well-groomed. He is not ill-appearing or toxic-appearing.     Comments: Gait stable with his cane support.  HENT:     Head: Normocephalic.     Right Ear: Hearing and external ear normal.     Left Ear: Hearing and external ear normal.     Mouth/Throat:     Lips: Pink.     Mouth: Mucous membranes are moist.  Eyes:     General: Lids are normal.     Extraocular Movements: Extraocular movements intact.     Conjunctiva/sclera: Conjunctivae normal.  Neck:     Thyroid : No thyromegaly.     Vascular: No carotid bruit.  Cardiovascular:     Rate  and Rhythm: Normal rate. Rhythm irregularly irregular.     Heart sounds: Normal heart sounds. No murmur heard.    No gallop.  Pulmonary:     Effort: No accessory muscle usage or respiratory distress.     Breath sounds: Normal breath sounds.  Abdominal:     General: Bowel sounds are normal. There is no distension.     Palpations: Abdomen is soft.     Tenderness: There is no abdominal tenderness.  Musculoskeletal:     Cervical back: Full passive range of motion without pain.     Right lower leg: 1+ Pitting Edema present.     Left lower leg: 1+ Pitting Edema present.  Lymphadenopathy:     Cervical: No cervical adenopathy.  Skin:    General: Skin is warm.     Capillary Refill:  Capillary refill takes less than 2 seconds.  Neurological:     Mental Status: He is alert and oriented to person, place, and time.     Deep Tendon Reflexes: Reflexes are normal and symmetric.     Reflex Scores:      Brachioradialis reflexes are 2+ on the right side and 2+ on the left side.      Patellar reflexes are 2+ on the right side and 2+ on the left side. Psychiatric:        Attention and Perception: Attention normal.        Mood and Affect: Mood normal.        Speech: Speech normal.        Behavior: Behavior normal. Behavior is cooperative.        Thought Content: Thought content normal.    Results for orders placed or performed in visit on 01/07/24  Bayer DCA Hb A1c Waived   Collection Time: 01/07/24  1:59 PM  Result Value Ref Range   HB A1C (BAYER DCA - WAIVED) 7.0 (H) 4.8 - 5.6 %  Comprehensive metabolic panel with GFR   Collection Time: 01/07/24  2:00 PM  Result Value Ref Range   Glucose 152 (H) 70 - 99 mg/dL   BUN 23 8 - 27 mg/dL   Creatinine, Ser 8.85 0.76 - 1.27 mg/dL   eGFR 65 >40 fO/fpw/8.26   BUN/Creatinine Ratio 20 10 - 24   Sodium 138 134 - 144 mmol/L   Potassium 4.1 3.5 - 5.2 mmol/L   Chloride 99 96 - 106 mmol/L   CO2 22 20 - 29 mmol/L   Calcium  9.5 8.6 - 10.2 mg/dL   Total Protein 6.8 6.0 - 8.5 g/dL   Albumin 4.3 3.7 - 4.7 g/dL   Globulin, Total 2.5 1.5 - 4.5 g/dL   Bilirubin Total 0.5 0.0 - 1.2 mg/dL   Alkaline Phosphatase 70 48 - 129 IU/L   AST 18 0 - 40 IU/L   ALT 12 0 - 44 IU/L  Lipid Panel w/o Chol/HDL Ratio   Collection Time: 01/07/24  2:00 PM  Result Value Ref Range   Cholesterol, Total 96 (L) 100 - 199 mg/dL   Triglycerides 851 0 - 149 mg/dL   HDL 42 >60 mg/dL   VLDL Cholesterol Cal 25 5 - 40 mg/dL   LDL Chol Calc (NIH) 29 0 - 99 mg/dL   *Note: Due to a large number of results and/or encounters for the requested time period, some results have not been displayed. A complete set of results can be found in Results Review.      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  BP at goal today. Recommend he monitor BP at least a few mornings a week at home and document.  DASH diet at home.  Will continue off Benazepril  as lip swelling improving, concern this may have been cause -- added to allergy list. Could consider ARB in future, trial. For now continue Amlodipine  10 MG nightly (discussed at length with him to take only daily as on bottle and not twice a day as he was -- suspect this was leading to his edema due to taking over the amount ordered) and continue Hydralazine  to take BID as needed only if BP >140/90.  Educated him at length on this plan of care.  Labs today: up to date.      Relevant Medications   amLODipine  (NORVASC ) 10 MG tablet     Follow up plan: Return in about 3 months (around 05/04/2024) for T2DM, HTN/HLD, COPD, PAIN -- after 05/06/24.      "

## 2024-02-20 ENCOUNTER — Encounter: Payer: Self-pay | Admitting: Cardiovascular Disease

## 2024-02-20 ENCOUNTER — Ambulatory Visit: Attending: Cardiovascular Disease | Admitting: Cardiovascular Disease

## 2024-02-20 VITALS — BP 132/70 | HR 74 | Ht 73.0 in | Wt 186.2 lb

## 2024-02-20 DIAGNOSIS — I2510 Atherosclerotic heart disease of native coronary artery without angina pectoris: Secondary | ICD-10-CM | POA: Insufficient documentation

## 2024-02-20 DIAGNOSIS — I10 Essential (primary) hypertension: Secondary | ICD-10-CM | POA: Diagnosis not present

## 2024-02-20 DIAGNOSIS — E785 Hyperlipidemia, unspecified: Secondary | ICD-10-CM | POA: Diagnosis present

## 2024-02-20 DIAGNOSIS — I480 Paroxysmal atrial fibrillation: Secondary | ICD-10-CM | POA: Diagnosis present

## 2024-02-20 NOTE — Progress Notes (Signed)
 "    Cardiology Office Note   Date:  02/20/2024   ID:  Sean Taha., DOB [birth year], MRN [id number]  PCP:  Valerio Melanie DASEN, NP  Cardiologist:   Deatrice Cage, MD   Chief Complaint  Patient presents with   Follow-up    6 Month f/u c/o Amlodipine  causing edema. Meds reviewed verbally with pt.      History of Present Illness: Sean Hanf. is a 84 y.o. male who presents for a follow-up visit regarding paroxysmal atrial fibrillation, PVCs and coronary artery disease.   Other medical problems include essential hypertension, hyperlipidemia, type 2 diabetes and chronic kidney disease. The patient is severely limited by back pain which has been chronic.  He had 3 back surgeries. He presented in November 2022 with non-ST elevation myocardial infarction.  Cardiac catheterization was done which showed severe proximal RCA stenosis which was treated successfully with PCI and drug-eluting stent placement.  He had repeat cardiac catheterization in July 2023 which showed patent RCA stent with no significant restenosis.  There was borderline 60% stenosis in the distal LAD and mild distal left circumflex disease.    He has known history of paroxysmal atrial fibrillation for many years.  He underwent an ablation procedure at [facility] many years ago and reports having a complication during the procedure that necessitated aborting the procedure.  He subsequently had ablation at [facility] by Dr. Debby with improvement.   Upon his initial evaluation with me in July 2023, he was on propafenone  which was discontinued given his coronary artery disease.  I started him on small dose Toprol  instead.  This was subsequently discontinued due to symptomatic bradycardia  He had lip swelling on benazepril  and was switched to amlodipine .  He was taking 10 mg twice daily by mistake and had lower extremity edema with that.  The dose was decreased to 5 mg twice daily and since then leg swelling improved.  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   Hyperlipidemia    Hypertension    Myocardial infarction (HCC) 12/2020   Nocturia    S/P ablation of atrial fibrillation    Ablative therapy   Sleep apnea    CPAP    Spinal stenosis    Tachycardia, unspecified     Past Surgical History:  Procedure Laterality Date   ABLATION     ANTERIOR LAT LUMBAR FUSION N/A 06/27/2017   Procedure: Anterior Lateral Lumbar Interbody  Fusion - Lumbar Two-Lumbar Three - Lumbar Three-Lumbar Four, Posterior Lumbar Interbody Fusion Lumbar Four- Five;  Surgeon: Onetha Kuba, MD;  Location: [facility] OR;  Service: Neurosurgery;  Laterality: N/A;  Anterior Lateral Lumbar Interbody  Fusion - Lumbar Two-Lumbar Three - Lumbar Three-Lumbar Four, Posterior Lumbar Interbody Fusion Lumbar Four- Five   BACK SURGERY     CARDIAC CATHETERIZATION     CARDIOVERSION N/A 08/29/2018   Procedure: CARDIOVERSION;  Surgeon: Hester Wolm PARAS, MD;  Location: [facility] ORS;  Service: Cardiovascular;  Laterality: N/A;   CARDIOVERSION N/A 09/24/2018   Procedure: CARDIOVERSION;  Surgeon: Hester Wolm PARAS, MD;  Location: [facility] ORS;  Service: Cardiovascular;  Laterality: N/A;   COLONOSCOPY WITH PROPOFOL  N/A 10/05/2015   Procedure: COLONOSCOPY WITH PROPOFOL ;  Surgeon: Gladis RAYMOND Mariner, MD;  Location: [facility] ENDOSCOPY;  Service: Endoscopy;  Laterality: N/A;   COLONOSCOPY WITH PROPOFOL  N/A 11/01/2020   Procedure: COLONOSCOPY WITH PROPOFOL ;  Surgeon: Therisa Bi, MD;  Location: [facility] ENDOSCOPY;  Service: Gastroenterology;   Laterality: N/A;   CORONARY STENT INTERVENTION N/A 12/22/2020   Procedure: CORONARY STENT INTERVENTION;  Surgeon: Ammon Blunt, MD;  Location: [facility] INVASIVE CV LAB;  Service: Cardiovascular;  Laterality: N/A;   ESOPHAGOGASTRODUODENOSCOPY N/A 11/01/2020   Procedure: ESOPHAGOGASTRODUODENOSCOPY (EGD);  Surgeon: Therisa Bi, MD;  Location: [facility] ENDOSCOPY;  Service: Gastroenterology;  Laterality: N/A;   ESOPHAGOGASTRODUODENOSCOPY (EGD) WITH PROPOFOL  N/A 04/01/2018   Procedure: ESOPHAGOGASTRODUODENOSCOPY (EGD) WITH PROPOFOL ;  Surgeon: Mariner Gladis RAYMOND, MD;  Location: [facility] ENDOSCOPY;  Service: Endoscopy;  Laterality: N/A;   ESOPHAGOGASTRODUODENOSCOPY (EGD) WITH PROPOFOL  N/A 06/01/2022   Procedure: ESOPHAGOGASTRODUODENOSCOPY (EGD) WITH PROPOFOL ;  Surgeon: Therisa Bi, MD;  Location: [facility] ENDOSCOPY;  Service: Gastroenterology;  Laterality: N/A;   EYE SURGERY     HERNIA REPAIR     JOINT REPLACEMENT Bilateral    hips  RT+  LEFT X2    LEFT HEART CATH AND CORONARY ANGIOGRAPHY N/A 12/22/2020   Procedure: LEFT HEART CATH AND CORONARY ANGIOGRAPHY;  Surgeon: Ammon Blunt, MD;  Location: [facility] INVASIVE CV LAB;  Service: Cardiovascular;  Laterality: N/A;   LEFT HEART CATH AND CORONARY ANGIOGRAPHY Left 08/23/2021   Procedure: LEFT HEART CATH AND CORONARY ANGIOGRAPHY;  Surgeon: Hester Wolm PARAS, MD;  Location: [facility] INVASIVE CV LAB;  Service: Cardiovascular;  Laterality: Left;   LUMBAR LAMINECTOMY/DECOMPRESSION MICRODISCECTOMY Left 09/13/2016   Procedure: Microdiscectomy - Lumbar two-three,  Lumbar three- - left;  Surgeon: Onetha Kuba, MD;  Location: [facility] OR;  Service: Neurosurgery;  Laterality: Left;   SPINAL CORD STIMULATOR INSERTION  07/08/2019   TONSILLECTOMY       Current Outpatient Medications  Medication Sig Dispense Refill   amLODipine  (NORVASC ) 5 MG tablet Take 5 mg by mouth in the morning and at bedtime.     cyanocobalamin  1000 MCG tablet Take 1,000 mcg by mouth daily.     ELIQUIS  5 MG  TABS tablet Take 1 tablet (5 mg total) by mouth 2 (two) times daily. 180 tablet 3   EPINEPHrine  0.3 mg/0.3 mL IJ SOAJ injection Inject 0.3 mg into the muscle as needed for anaphylaxis. 1 each 0   ferrous sulfate 325 (65 FE) MG EC tablet Take 325 mg by mouth daily with breakfast.     finasteride  (PROSCAR ) 5 MG tablet Take 1 tablet (5 mg total) by mouth daily. 90 tablet 3   hydrALAZINE  (APRESOLINE ) 25 MG tablet Take 1 tablet (25 mg total) by mouth 2 (two) times daily as needed (For BP >140/90 as needed only). 60 tablet 1   metFORMIN  (GLUCOPHAGE ) 500 MG tablet Take 1 tablet (500 mg total) by mouth 2 (two) times daily with a meal. 180 tablet 4   omeprazole  (PRILOSEC) 40 MG capsule Take 40 mg by mouth 2 (two) times daily.     prednisoLONE acetate (PRED FORTE) 1 % ophthalmic suspension Place 1 drop into both eyes daily.     rosuvastatin  (CRESTOR ) 5 MG tablet Take 1 tablet (5 mg total) by mouth daily. 90  tablet 4   solifenacin  (VESICARE ) 5 MG tablet Take 1 tablet (5 mg total) by mouth daily. 90 tablet 3   tamsulosin  (FLOMAX ) 0.4 MG CAPS capsule Take 2 capsules (0.8 mg total) by mouth daily after supper. 180 capsule 3   umeclidinium-vilanterol (ANORO ELLIPTA ) 62.5-25 MCG/ACT AEPB Inhale 1 puff into the lungs daily. 90 each 12   amLODipine  (NORVASC ) 10 MG tablet Take 1 tablet (10 mg total) by mouth daily. (Patient not taking: Reported on 02/20/2024)     No current facility-administered medications for this visit.    Allergies:   Levaquin [levofloxacin in d5w], Shellfish allergy, Amiodarone, Benazepril , and Adhesive [tape]    Social History:  The patient  reports that he has never smoked. He has never used smokeless tobacco. He reports that he does not drink alcohol and does not use drugs.   Family History:  The patient's family history includes Brain cancer in his mother; Other in his father.    ROS:  Please see the history of present illness.   Otherwise, review of systems are positive for none.   All  other systems are reviewed and negative.    PHYSICAL EXAM: VS:  BP 132/70 (BP Location: Left Arm, Patient Position: Sitting, Cuff Size: Large)   Pulse 74   Ht 6' 1 (1.854 m)   Wt 186 lb 4 oz (84.5 kg)   SpO2 98%   BMI 24.57 kg/m  , BMI Body mass index is 24.57 kg/m. GEN: Well nourished, well developed, in no acute distress  HEENT: normal  Neck: no JVD, carotid bruits, or masses Cardiac: RRR; no  rubs, or gallops, mild bilateral lower extremity edema worse on the left.  1 out of 6 systolic murmur in the aortic area Respiratory:  clear to auscultation bilaterally, normal work of breathing GI: soft, nontender, nondistended, + BS MS: no deformity or atrophy  Skin: warm and dry, no rash Neuro:  Strength and sensation are intact Psych: euthymic mood, full affect Distal pulses are palpable.  Femoral pulses are +2.   EKG:  EKG is ordered today. The ekg ordered today demonstrates : Sinus rhythm with 1st degree A-V block Possible Anterior infarct , age undetermined When compared with ECG of 14-Aug-2023 14:24, No significant change was found    Recent Labs: 10/17/2023: Hemoglobin 10.9; Platelet Count 139 01/07/2024: ALT 12; BUN 23; Creatinine, Ser 1.14; Potassium 4.1; Sodium 138    Lipid Panel    Component Value Date/Time   CHOL 96 (L) 01/07/2024 1400   TRIG 148 01/07/2024 1400   HDL 42 01/07/2024 1400   CHOLHDL 2.6 12/21/2020 0652   VLDL 13 12/21/2020 0652   LDLCALC 29 01/07/2024 1400      Wt Readings from Last 3 Encounters:  02/20/24 186 lb 4 oz (84.5 kg)  02/04/24 189 lb 3.2 oz (85.8 kg)  01/28/24 190 lb (86.2 kg)          09/08/2021    8:39 AM  PAD Screen  Previous PAD dx? No  Previous surgical procedure? Yes  Pain with walking? No  Feet/toe relief with dangling? No  Painful, non-healing ulcers? No  Extremities discolored? No      ASSESSMENT AND PLAN:  1.  Coronary artery disease involving native coronary arteries: Status post myocardial infarction and  stent placement to the right coronary artery in November 2022.   He does have borderline disease in the distal LAD but the vessel is too small in that area.  Currently with no angina and  subsequent cardiac PET scan showed no evidence of perfusion defects.  Continue medical therapy.  No antiplatelet medications given that he is on Eliquis .  2.  Paroxysmal atrial fibrillation: He is status post ablation and is maintaining in sinus rhythm.  No breakthrough atrial fibrillation.  Continue anticoagulation with Eliquis  5 mg twice daily.  3.  Essential hypertension: Benazepril  was recently switched to amlodipine  due to lip swelling.  He is having increased lower extremity edema which seems to be manageable at this time.  If this becomes an issue, consider switching to an ARB.  Thiazide diuretics are likely not a good option given intermittent hyponatremia.  4.  Hyperlipidemia: Continue treatment with rosuvastatin .  I reviewed most recent lipid profile which showed an LDL of 29.    Disposition:   FU with me in 6 months  Signed,  Deatrice Cage, MD  02/20/2024 2:32 PM    [facility] "

## 2024-02-20 NOTE — Patient Instructions (Signed)

## 2024-02-23 NOTE — Therapy (Incomplete)
 " OUTPATIENT PHYSICAL THERAPY BALANCE EVALUATION   Patient Name: Sean Reyes. MRN: [id number] DOB:1941-01-10, 84 y.o., male Today's Date: 02/23/2024  END OF SESSION:   Past Medical History:  Diagnosis Date   Anemia    Anxiety    Arthritis    Arthritis of neck    Atrial fibrillation (HCC)    Cataracts, bilateral    Complication of anesthesia    pt reports low BP's after surgery at [facility] and difficulty awakening   Depression    Diabetes (HCC)    dx 6-8 yrs ago   Dysrhythmia    a-fib   GERD (gastroesophageal reflux disease)    OCC TAKES ALKA SELTZER   History of kidney stones    10-15 yrs ago   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   Hyperlipidemia    Hypertension    Myocardial infarction (HCC) 12/2020   Nocturia    S/P ablation of atrial fibrillation    Ablative therapy   Sleep apnea    CPAP    Spinal stenosis    Tachycardia, unspecified    Past Surgical History:  Procedure Laterality Date   ABLATION     ANTERIOR LAT LUMBAR FUSION N/A 06/27/2017   Procedure: Anterior Lateral Lumbar Interbody  Fusion - Lumbar Two-Lumbar Three - Lumbar Three-Lumbar Four, Posterior Lumbar Interbody Fusion Lumbar Four- Five;  Surgeon: Onetha Kuba, MD;  Location: [facility] OR;  Service: Neurosurgery;  Laterality: N/A;  Anterior Lateral Lumbar Interbody  Fusion - Lumbar Two-Lumbar Three - Lumbar Three-Lumbar Four, Posterior Lumbar Interbody Fusion Lumbar Four- Five   BACK SURGERY     CARDIAC CATHETERIZATION     CARDIOVERSION N/A 08/29/2018   Procedure: CARDIOVERSION;  Surgeon: Hester Wolm PARAS, MD;  Location: [facility] ORS;  Service: Cardiovascular;  Laterality: N/A;   CARDIOVERSION N/A 09/24/2018   Procedure: CARDIOVERSION;  Surgeon: Hester Wolm PARAS, MD;  Location: [facility] ORS;  Service: Cardiovascular;  Laterality: N/A;   COLONOSCOPY WITH PROPOFOL  N/A 10/05/2015   Procedure: COLONOSCOPY WITH PROPOFOL ;  Surgeon: Gladis RAYMOND Mariner, MD;  Location: [facility] ENDOSCOPY;  Service:  Endoscopy;  Laterality: N/A;   COLONOSCOPY WITH PROPOFOL  N/A 11/01/2020   Procedure: COLONOSCOPY WITH PROPOFOL ;  Surgeon: Therisa Bi, MD;  Location: [facility] ENDOSCOPY;  Service: Gastroenterology;  Laterality: N/A;   CORONARY STENT INTERVENTION N/A 12/22/2020   Procedure: CORONARY STENT INTERVENTION;  Surgeon: Ammon Blunt, MD;  Location: [facility] INVASIVE CV LAB;  Service: Cardiovascular;  Laterality: N/A;   ESOPHAGOGASTRODUODENOSCOPY N/A 11/01/2020   Procedure: ESOPHAGOGASTRODUODENOSCOPY (EGD);  Surgeon: Therisa Bi, MD;  Location: [facility] ENDOSCOPY;  Service: Gastroenterology;  Laterality: N/A;   ESOPHAGOGASTRODUODENOSCOPY (EGD) WITH PROPOFOL  N/A 04/01/2018   Procedure: ESOPHAGOGASTRODUODENOSCOPY (EGD) WITH PROPOFOL ;  Surgeon: Mariner Gladis RAYMOND, MD;  Location: [facility] ENDOSCOPY;  Service: Endoscopy;  Laterality: N/A;   ESOPHAGOGASTRODUODENOSCOPY (EGD) WITH PROPOFOL  N/A 06/01/2022   Procedure: ESOPHAGOGASTRODUODENOSCOPY (EGD) WITH PROPOFOL ;  Surgeon: Therisa Bi, MD;  Location: [facility] ENDOSCOPY;  Service: Gastroenterology;  Laterality: N/A;   EYE SURGERY     HERNIA REPAIR     JOINT REPLACEMENT Bilateral    hips  RT+  LEFT X2    LEFT HEART CATH AND CORONARY ANGIOGRAPHY N/A 12/22/2020   Procedure: LEFT HEART CATH AND CORONARY ANGIOGRAPHY;  Surgeon: Ammon Blunt, MD;  Location: [facility] INVASIVE CV LAB;  Service: Cardiovascular;  Laterality: N/A;   LEFT HEART CATH AND CORONARY ANGIOGRAPHY Left 08/23/2021   Procedure: LEFT HEART CATH AND CORONARY ANGIOGRAPHY;  Surgeon: Hester Wolm PARAS, MD;  Location:  [facility] INVASIVE CV LAB;  Service: Cardiovascular;  Laterality: Left;   LUMBAR LAMINECTOMY/DECOMPRESSION MICRODISCECTOMY Left 09/13/2016   Procedure: Microdiscectomy - Lumbar two-three,  Lumbar three- - left;  Surgeon: Onetha Kuba, MD;  Location: [facility] OR;  Service: Neurosurgery;  Laterality: Left;   SPINAL CORD STIMULATOR INSERTION  07/08/2019   TONSILLECTOMY     Patient Active Problem List   Diagnosis  Date Noted   Gynecomastia, male 01/07/2024   Lip swelling 07/04/2023   Diabetes mellitus treated with oral medication (HCC) 04/05/2023   COPD (chronic obstructive pulmonary disease) (HCC) 10/31/2021   Shortness of breath on exertion 08/04/2021   Diabetes mellitus with proteinuria (HCC) 04/04/2021   GERD without esophagitis 04/02/2021   Coronary artery disease 12/28/2020   History of non-ST elevation myocardial infarction (NSTEMI) 12/21/2020   Pain in right shin 10/25/2020   Peripheral vascular disease 08/06/2020   Chronic pain syndrome 07/06/2020   Cervical facet joint syndrome 04/08/2020   Spinal cord stimulator status 12/11/2019   Elevated TSH 08/20/2019   CKD (chronic kidney disease) stage 3, GFR 30-59 ml/min (HCC) 01/19/2019   Acquired thrombophilia 01/19/2019   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   Postlaminectomy syndrome, lumbar region 01/16/2019   History of fusion of lumbar spine (L2-L5) 01/16/2019   Chronic radicular lumbar pain 01/16/2019   HNP (herniated nucleus pulposus), lumbar 04/29/2018   Advanced care planning/counseling discussion 09/28/2016   Spinal stenosis, lumbar region, 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/13/2016   Hyperlipidemia associated with type 2 diabetes mellitus (HCC) 07/14/2015   Symptomatic anemia 06/30/2015   Benign prostatic hyperplasia without lower urinary tract symptoms 06/02/2015   OSA (obstructive sleep apnea) 03/23/2015   Hypertension associated with diabetes (HCC) 09/28/2014   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HCC) 09/28/2014   H/O prior ablation treatment 10/19/2011   AF (paroxysmal atrial fibrillation) (HCC) 10/19/2011   PCP: Valerio Melanie DASEN, NP  REFERRING PROVIDER: Valerio Melanie DASEN, NP   REFERRING DIAG: R26.89 (ICD-10-CM) - Imbalance   RATIONALE FOR EVALUATION AND TREATMENT: Rehabilitation  THERAPY DIAG: Muscle weakness (generalized)  Unsteadiness on feet  ONSET DATE: 01/25/23 (acute on chronic)  FOLLOW-UP  APPT SCHEDULED WITH REFERRING PROVIDER: Yes   FROM INITIAL EVALUATION SUBJECTIVE:                                                                                                                                                                                         SUBJECTIVE STATEMENT:  Imbalance and LE weakness;  PERTINENT HISTORY:  Lower extremity weakness in patient with 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 He reports weakness in both legs and numbness in toes post surgery in 2018. He experienced a fall on 01/25/2023 at home, resulting in a L hip injury. No residual pain in hip at this time. No additional falls since December. He reports progressive LE weakness impairing his functional ability at home. He is known to this clinic from prior episodes of care for similar concerns.  Prior history: 11/02/22 Patient reports to physical therapy today with a chief concern of muscle weakness and imbalance. Patient reports progressive worsening in balance and muscular strength. He states that he has loss of sensation in his toes he reports they are asleep. Patient reports pain in the anterior thigh and bilateral calves. He currently uses an loftstand crutch in his RUE. Lately patient has reported difficulty with dressing LE, performing fine motor movements with left hand. He denies falls, saddle parasthesia, nausea, vomitting, night sweats.    10/17/2022 Patient reported he is not doing well lately. He has been seeing Dr. Clois. He reports his legs are so weak that he can hardly walk, so he has been using a crutch for the last 1-2 years. Reported numbness and loss of sensation in his  toes. He also reported that recently his left hand has began to has some numbness to where it feels like it is asleep. Denies pain, loss of sensation, coldness, burning. Denies falls.  Imaging: NCS conducted on 11/12/2018: Abnormal study. 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 There is also preliminary evidence of a superimposed left lower lumbosacral polyradiculopathy, based on NCV and distal needle exam results. I cannot test lumbar paraspinals due to patient on Eliquis .    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 Yes. 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  Recent memory is intact.   Remote memory is intact.  Attention span and concentration are intact.  Expressive speech is intact.  Patient's fund of knowledge is within normal limits for educational level.    Gross Musculoskeletal Assessment Tremor: None Bulk: Normal Tone: Normal  Posture: Forward head and rounded shoulders  AROM Deferred specific measurements. Functional motion intact;  LE MMT: MMT (out of 5)  Right  Left   Hip flexion    Hip extension    Hip abduction (seated)    Hip adduction (seated    Hip internal rotation    Hip external rotation    Knee flexion (seated)    Knee extension    Ankle dorsiflexion    Ankle plantarflexion    Ankle inversion    Ankle eversion    (* = pain; Blank rows = not tested)  Transfers: Assistive device utilized: Single Lofstrand RUE  Sit to stand: Modified independence Stand to sit: Modified independence Chair to chair: Modified independence Floor: Deferred  Stairs: Level of Assistance: CGA Stair Negotiation Technique: Alternating Pattern  with Bilateral Rails Number of Stairs: 4  Height of Stairs: 6  Comments: Slow and labored ascending/descending. No overt LOB;  Gait: Gait pattern: decreased step length- Right, decreased step length- Left, trunk flexed, poor foot clearance- Right, and poor foot clearance- Left Distance walked: 150' over the course of evaluation Assistive device utilized: Single Lofstrand in RUE Level of assistance: Modified independence Comments: Slow and labored ambulation. Decreased self selected speed  ABC: 39.4%  Functional Outcome Measures  Results Comments  BERG 52/56 Mild balance deficits  DGI    FGA    TUG 13.6 seconds no crutch 16.2 seconds with crutch Increased fall risk  5TSTS 13.9 seconds WNL  6 Minute Walk Test    10 Meter Gait Speed Self-selected: 20.2s = 0.5 m/s; Fastest: 16s = 0.63 m/s Below community ambulation speed (0.8 m/s)    TODAY'S TREATMENT: 02/23/2024  Not performed   PATIENT EDUCATION:   Education details: Plan of care Person educated: Patient Education method: Explanation Education comprehension: verbalized understanding   HOME EXERCISE PROGRAM:  HEP from prior episode of care Access Code: [email or web address] URL: [email or web address] Date: 05/31/2023 Prepared by: Selinda Eck  Exercises - Seated Long Arc Quad  - 1 x daily - 3-4 x weekly - 3 sets - 10 reps - 2s hold - Seated Hip Abduction with Resistance  - 1 x daily - 3-4 x weekly - 3 sets - 10 reps - 2s hold - Heel Raises with Counter Support  - 1 x daily - 3-4 x weekly - 3 sets - 10 reps - 2s hold - Mini Squat with Counter Support  - 1 x daily - 3-4 x weekly - 3 sets - 10 reps - Semi-Tandem Balance at [employer] Eyes Open  - 1 x daily - 7 x weekly - 2-3 sets - 10 reps - 10-30 hold   ASSESSMENT:  CLINICAL IMPRESSION:      Patient arrives to treatment session very motivated to participate. Session focused on BLE strengthening today. He continues to require multiple rest breaks throughout. Pt demonstrated appropriate effort throughout the session. Plan to progress strengthening at future sessions as pt can tolerate. He will benefit from PT services to address deficits in strength, balance, and mobility in order to improve function at home and decrease his risk for falls.    OBJECTIVE IMPAIRMENTS: Abnormal gait, decreased balance, difficulty walking, decreased strength, impaired sensation, and pain.   ACTIVITY LIMITATIONS: bending, standing, squatting, stairs, transfers, and caring for others  PARTICIPATION LIMITATIONS: meal prep, cleaning, laundry, shopping, and community activity  PERSONAL FACTORS: Age, Past/current experiences, Time since onset of injury/illness/exacerbation, and 3+ comorbidities: generalized polyneuropathy, COPD, DM, NSTEMI, and spinal stenosis are also affecting patient's functional outcome.   REHAB POTENTIAL: Fair    CLINICAL DECISION MAKING: Unstable/unpredictable  EVALUATION  COMPLEXITY: High   GOALS:  Goals reviewed with patient? No  SHORT TERM GOALS: Target date: 07/10/2023  Pt will be independent with HEP in order to improve strength and balance in order to decrease fall risk and improve function at home. Baseline:  Goal status: MET   LONG TERM GOALS: Target date: 11/15/2023  Pt will improve ABC by at least 13% in order to demonstrate clinically significant improvement in balance confidence.  Baseline: 20%; 07/10/23: 30%, 08/13/23: 36.25%; 12/20/23: 39.4% Goal status: ACHIEVED  2.  Pt will improve BERG by at least 3 points in order to demonstrate clinically significant improvement in balance.   Baseline: 40/56; 07/10/23: 50/56; 08/13/23: 52/56; 12/20/23: 52/56; Goal status: ACHIEVED  3.  Pt will increase self-selected to at least 0.6 m/s in order to demonstrate clinically significant improvement in limited community ambulation.         Baseline: self-selected: 0.49 m/s; 07/10/23: 0.52 m/s; 08/13/23: 0.49 m/s; 12/20/23: 0.50 m/s; Goal status: PARTIALLY MET;  4. Pt will be able to complete 5TSTS without UE support in order to demonstrate clinically significant improvement in LE strength. Baseline: Unable to perform sit to stand without heavy UE assist; 07/10/23: 11.4s; 08/13/23: 12.72 s; 12/20/23: 13.9s; Goal status: ACHIEVED  5. Pt will decrease TUG to below 14 seconds/decrease in order to demonstrate decreased fall risk.  Baseline: 22.2s; 07/10/23: 14.0s; 08/13/23: 24.53s no crutch, 16.20s with crutch; 12/20/23: 13.6s no crutch, 16.2s with crutch Goal status: ONGOING  PLAN: PT FREQUENCY: 2x/week  PT DURATION: 12 weeks  PLANNED INTERVENTIONS: Therapeutic exercises, Therapeutic activity, Neuromuscular re-education, Balance training, Gait training, Patient/Family education, Self Care, Joint mobilization, Joint manipulation, Vestibular training, Canalith repositioning, Orthotic/Fit training, DME instructions, Electrical stimulation, Cryotherapy, Moist heat,  Manual therapy, and Re-evaluation.  PLAN FOR NEXT SESSION: progress strengthening exercises, walking/ambulation speed and endurance tasks, modify/progress HEP as needed;   Zaylen Susman D Chirsty Armistead PT, DPT, GCS  Physical Therapist - [place]  [facility] 1:56 PM,02/23/2024 "

## 2024-02-25 ENCOUNTER — Ambulatory Visit: Admitting: Nurse Practitioner

## 2024-02-25 ENCOUNTER — Encounter: Payer: Self-pay | Admitting: Nurse Practitioner

## 2024-02-25 ENCOUNTER — Ambulatory Visit: Attending: Neurology

## 2024-02-25 VITALS — BP 123/79 | HR 85 | Temp 97.6°F | Resp 18 | Ht 72.99 in | Wt 182.9 lb

## 2024-02-25 DIAGNOSIS — J101 Influenza due to other identified influenza virus with other respiratory manifestations: Secondary | ICD-10-CM

## 2024-02-25 DIAGNOSIS — R051 Acute cough: Secondary | ICD-10-CM | POA: Diagnosis not present

## 2024-02-25 DIAGNOSIS — M6281 Muscle weakness (generalized): Secondary | ICD-10-CM

## 2024-02-25 DIAGNOSIS — R2681 Unsteadiness on feet: Secondary | ICD-10-CM

## 2024-02-25 LAB — POC COVID19 BINAXNOW: SARS Coronavirus 2 Ag: NEGATIVE

## 2024-02-25 LAB — VERITOR FLU A/B WAIVED
Influenza A: POSITIVE — AB
Influenza B: NEGATIVE

## 2024-02-25 MED ORDER — OSELTAMIVIR PHOSPHATE 75 MG PO CAPS: 75.0000 mg | ORAL_CAPSULE | Freq: Once | ORAL | 0 refills | Status: AC

## 2024-02-25 MED ORDER — OSELTAMIVIR PHOSPHATE 30 MG PO CAPS: 30.0000 mg | ORAL_CAPSULE | Freq: Two times a day (BID) | ORAL | 0 refills | Status: AC

## 2024-02-25 NOTE — Assessment & Plan Note (Signed)
 Acute for <48 hours. Flu testing positive for A and Covid testing negative. Start renal dosed Tamiflu  due to CrCl 58. Educated patient on this medication. His wife is having symptoms too, recommend she alert her PCP so treatment can be initiated. Recommend: - Increased rest - Increasing Fluids - Acetaminophen  as needed for fever/pain.  - Salt water gargling, chloraseptic spray and throat lozenges - Mucinex.  - Humidifying the air.  - Return to office if any worsening or ongoing.

## 2024-02-25 NOTE — Progress Notes (Signed)
 "  BP 123/79 (BP Location: Left Arm, Patient Position: Sitting, Cuff Size: Normal)   Pulse 85   Temp 97.6 F (36.4 C) (Oral)   Resp 18   Ht 6' 0.99 (1.854 m)   Wt 182 lb 14.4 oz (83 kg)   SpO2 98%   BMI 24.14 kg/m    Subjective:    Patient ID: Jonette Clancy Raddle., male    DOB: 16-Apr-1940, 84 y.o.   MRN: [id number]  HPI: Tavyn Kurka. is a 84 y.o. male  Chief Complaint  Patient presents with   Cough    Has the crud started getting sick Saturday morning. Coughing up phlegm and has a dry cough and feels bad.   COPD Started to feel bad Saturday night.  Had fever initially, none since then. Did not test for Covid or Flu at home. Taking OTC congestion medication for this. Uses Anoro at home. Does not want Albuterol  inhaler at home, reports his SOB has not been bad. COPD status: exacerbated Satisfied with current treatment?: yes Oxygen use: no Dyspnea frequency: occasional, but not much Cough frequency: yes Rescue inhaler frequency:  no use Limitation of activity: no Productive cough: yes Last Spirometry: with pulmonary Pneumovax: Up to Date Influenza: Up to Date   Relevant past medical, surgical, family and social history reviewed and updated as indicated. Interim medical history since our last visit reviewed. Allergies and medications reviewed and updated.  Review of Systems  Constitutional:  Positive for fatigue and fever (initially). Negative for activity change, appetite change, chills and unexpected weight change.  HENT:  Positive for congestion, postnasal drip, rhinorrhea, sinus pressure and sore throat (from drainage and coughing). Negative for ear discharge, ear pain, sinus pain and sneezing.   Respiratory:  Positive for cough, chest tightness and shortness of breath. Negative for wheezing.   Cardiovascular:  Negative for chest pain, palpitations and leg swelling.  Gastrointestinal: Negative.   Neurological:  Negative for headaches.  Psychiatric/Behavioral:  Negative.      Per HPI unless specifically indicated above     Objective:    BP 123/79 (BP Location: Left Arm, Patient Position: Sitting, Cuff Size: Normal)   Pulse 85   Temp 97.6 F (36.4 C) (Oral)   Resp 18   Ht 6' 0.99 (1.854 m)   Wt 182 lb 14.4 oz (83 kg)   SpO2 98%   BMI 24.14 kg/m   Wt Readings from Last 3 Encounters:  02/25/24 182 lb 14.4 oz (83 kg)  02/20/24 186 lb 4 oz (84.5 kg)  02/04/24 189 lb 3.2 oz (85.8 kg)    Physical Exam Vitals and nursing note reviewed.  Constitutional:      General: He is awake. He is not in acute distress.    Appearance: He is well-developed and well-groomed. He is ill-appearing. He is not toxic-appearing.  HENT:     Head: Normocephalic.     Right Ear: Hearing, ear canal and external ear normal. A middle ear effusion is present. There is no impacted cerumen. Tympanic membrane is not injected.     Left Ear: Hearing, ear canal and external ear normal. A middle ear effusion is present. There is no impacted cerumen. Tympanic membrane is not injected.     Nose: Rhinorrhea present. Rhinorrhea is clear.     Right Sinus: No maxillary sinus tenderness or frontal sinus tenderness.     Left Sinus: No maxillary sinus tenderness or frontal sinus tenderness.     Mouth/Throat:     Lips: Pink.  Mouth: Mucous membranes are moist.     Pharynx: Posterior oropharyngeal erythema (mild) and postnasal drip present. No pharyngeal swelling or oropharyngeal exudate.  Eyes:     General: Lids are normal.     Extraocular Movements: Extraocular movements intact.     Conjunctiva/sclera: Conjunctivae normal.  Neck:     Thyroid : No thyromegaly.     Vascular: No carotid bruit.  Cardiovascular:     Rate and Rhythm: Normal rate and regular rhythm.     Heart sounds: Normal heart sounds. No murmur heard.    No gallop.  Pulmonary:     Effort: No accessory muscle usage or respiratory distress.     Breath sounds: Normal breath sounds. No decreased breath sounds,  wheezing or rales.  Abdominal:     General: Bowel sounds are normal. There is no distension.     Palpations: Abdomen is soft.     Tenderness: There is no abdominal tenderness.  Musculoskeletal:     Cervical back: Full passive range of motion without pain.     Right lower leg: No edema.     Left lower leg: No edema.  Lymphadenopathy:     Head:     Right side of head: No submental, submandibular, tonsillar, preauricular or posterior auricular adenopathy.     Left side of head: No submental, submandibular, tonsillar, preauricular or posterior auricular adenopathy.     Cervical: No cervical adenopathy.  Skin:    General: Skin is warm.     Capillary Refill: Capillary refill takes less than 2 seconds.  Neurological:     Mental Status: He is alert and oriented to person, place, and time.     Deep Tendon Reflexes: Reflexes are normal and symmetric.     Reflex Scores:      Brachioradialis reflexes are 2+ on the right side and 2+ on the left side.      Patellar reflexes are 2+ on the right side and 2+ on the left side. Psychiatric:        Attention and Perception: Attention normal.        Mood and Affect: Mood normal.        Speech: Speech normal.        Behavior: Behavior normal. Behavior is cooperative.        Thought Content: Thought content normal.     Results for orders placed or performed in visit on 02/25/24  Influenza A & B (STAT)   Collection Time: 02/25/24 10:01 AM  Result Value Ref Range   Influenza A Positive (A) Negative   Influenza B Negative Negative  POC COVID-19   Collection Time: 02/25/24 10:24 AM  Result Value Ref Range   SARS Coronavirus 2 Ag Negative Negative   *Note: Due to a large number of results and/or encounters for the requested time period, some results have not been displayed. A complete set of results can be found in Results Review.      Assessment & Plan:   Problem List Items Addressed This Visit       Other   Influenza A virus present -  Primary   Acute for <48 hours. Flu testing positive for A and Covid testing negative. Start renal dosed Tamiflu  due to CrCl 58. Educated patient on this medication. His wife is having symptoms too, recommend she alert her PCP so treatment can be initiated. Recommend: - Increased rest - Increasing Fluids - Acetaminophen  as needed for fever/pain.  - Salt water gargling, chloraseptic spray and throat  lozenges - Mucinex.  - Humidifying the air.  - Return to office if any worsening or ongoing.      Relevant Orders   POC COVID-19 (Completed)   Influenza A & B (STAT) (Completed)     Follow up plan: Return if symptoms worsen or fail to improve.      "

## 2024-02-25 NOTE — Patient Instructions (Signed)
 Influenza, Adult Influenza is also called the flu. It's an infection that affects your respiratory tract. This includes your nose, throat, windpipe, and lungs. The flu is contagious. This means it spreads easily from person to person. It causes symptoms that are like a cold. It can also cause a high fever and body aches. What are the causes? The flu is caused by the influenza virus. You can get it by: Breathing in droplets that are in the air after an infected person coughs or sneezes. Touching something that has the virus on it and then touching your mouth, nose, or eyes. What increases the risk? You may be more likely to get the flu if: You don't wash your hands often. You're near a lot of people during cold and flu season. You touch your mouth, eyes, or nose without washing your hands first. You don't get a flu shot each year. You may also be more at risk for the flu and serious problems, such as a lung infection called pneumonia, if: You're older than 65. You're pregnant. Your immune system is weak. Your immune system is your body's defense system. You have a long-term, or chronic, condition, such as: Heart, kidney, or lung disease. Diabetes. A liver disorder. Asthma. You're very overweight. You have anemia. This is when you don't have enough red blood cells in your body. What are the signs or symptoms? Flu symptoms often start all of a sudden. They may last 4-14 days and include: Fever and chills. Headaches, body aches, or muscle aches. Sore throat. Cough. Runny or stuffy nose. Discomfort in your chest. Not wanting to eat as much as normal. Feeling weak or tired. Feeling dizzy. Nausea or vomiting. How is this diagnosed? The flu may be diagnosed based on your symptoms and medical history. You may also have a physical exam. A swab may be taken from your nose or throat and tested for the virus. How is this treated? If the flu is found early, you can be treated with antiviral  medicine. This may be given to you by mouth or through an IV. It can help you feel less sick and get better faster. Taking care of yourself at home can also help your symptoms get better. Your health care provider may tell you to: Take over-the-counter medicines. Drink lots of fluids. The flu often goes away on its own. If you have very bad symptoms or problems caused by the flu, you may need to be treated in a hospital. Follow these instructions at home: Activity Rest as needed. Get lots of sleep. Stay home from work or school as told by your provider. Leave home only to go see your provider. Do not leave home for other reasons until you don't have a fever for 24 hours without taking medicine. Eating and drinking Take an oral rehydration solution (ORS). This is a drink that is sold at pharmacies and stores. Drink enough fluid to keep your pee pale yellow. Try to drink small amounts of clear fluids. These include water, ice chips, fruit juice mixed with water, and low-calorie sports drinks. Try to eat bland foods that are easy to digest. These include bananas, applesauce, rice, lean meats, toast, and crackers. Avoid drinks that have a lot of sugar or caffeine in them. These include energy drinks, regular sports drinks, and soda. Do not drink alcohol. Do not eat spicy or fatty foods. General instructions     Take your medicines only as told by your provider. Use a cool mist humidifier  to add moisture to the air in your home. This can make it easier for you to breathe. You should also clean the humidifier every day. To do so: Empty the water. Pour clean water in. Cover your mouth and nose when you cough or sneeze. Wash your hands with soap and water often and for at least 20 seconds. It's extra important to do so after you cough or sneeze. If you can't use soap and water, use hand sanitizer. How is this prevented?  Get a flu shot every year. Ask your provider when you should get your flu  shot. Stay away from people who are sick during fall and winter. Fall and winter are cold and flu season. Contact a health care provider if: You get new symptoms. You have chest pain. You have watery poop, also called diarrhea. You have a fever. Your cough gets worse. You start to have more mucus. You feel like you may vomit, or you vomit. Get help right away if: You become short of breath or have trouble breathing. Your skin or nails turn blue. You have very bad pain or stiffness in your neck. You get a sudden headache or pain in your face or ear. You vomit each time you eat or drink. These symptoms may be an emergency. Call 911 right away. Do not wait to see if the symptoms will go away. Do not drive yourself to the hospital. This information is not intended to replace advice given to you by your health care provider. Make sure you discuss any questions you have with your health care provider. Document Revised: 11/02/2022 Document Reviewed: 03/09/2022 Elsevier Patient Education  2024 [employer].

## 2024-02-27 ENCOUNTER — Ambulatory Visit: Attending: Neurology

## 2024-02-28 ENCOUNTER — Telehealth: Payer: Self-pay | Admitting: Nurse Practitioner

## 2024-02-28 MED ORDER — AMOXICILLIN-POT CLAVULANATE 875-125 MG PO TABS: 1.0000 | ORAL_TABLET | Freq: Two times a day (BID) | ORAL | 0 refills | Status: AC

## 2024-02-28 NOTE — Telephone Encounter (Signed)
 Called patient to see if he wanted an appt, He stated that Jolene told him to call if he wasn't better by the end of the week.

## 2024-02-28 NOTE — Telephone Encounter (Signed)
 Copied from CRM [id number]. Topic: Clinical - Medical Advice >> Feb 28, 2024 11:35 AM Delon DASEN wrote: Reason for CRM: checking in with Jolene- flu symptoms not better, coughing all night long, no fever, back of throat is sore from coughing- [phone]

## 2024-02-28 NOTE — Telephone Encounter (Signed)
 Called patient and advised him of providers message, he sends his thanks and states that if this does not help then he will schedule an office visit.

## 2024-03-02 NOTE — Therapy (Incomplete)
 " OUTPATIENT PHYSICAL THERAPY BALANCE EVALUATION   Patient Name: Sean Reyes. MRN: [id number] DOB:06/22/1940, 84 y.o., male Today's Date: 03/02/2024  END OF SESSION:   Past Medical History:  Diagnosis Date   Anemia    Anxiety    Arthritis    Arthritis of neck    Atrial fibrillation (HCC)    Cataracts, bilateral    Complication of anesthesia    pt reports low BP's after surgery at [facility] and difficulty awakening   Depression    Diabetes (HCC)    dx 6-8 yrs ago   Dysrhythmia    a-fib   GERD (gastroesophageal reflux disease)    OCC TAKES ALKA SELTZER   History of kidney stones    10-15 yrs ago   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   Hyperlipidemia    Hypertension    Myocardial infarction (HCC) 12/2020   Nocturia    S/P ablation of atrial fibrillation    Ablative therapy   Sleep apnea    CPAP    Spinal stenosis    Tachycardia, unspecified    Past Surgical History:  Procedure Laterality Date   ABLATION     ANTERIOR LAT LUMBAR FUSION N/A 06/27/2017   Procedure: Anterior Lateral Lumbar Interbody  Fusion - Lumbar Two-Lumbar Three - Lumbar Three-Lumbar Four, Posterior Lumbar Interbody Fusion Lumbar Four- Five;  Surgeon: Onetha Kuba, MD;  Location[facility] OR;  Service: Neurosurgery;  Laterality: N/A;  Anterior Lateral Lumbar Interbody  Fusion - Lumbar Two-Lumbar Three - Lumbar Three-Lumbar Four, Posterior Lumbar Interbody Fusion Lumbar Four- Five   BACK SURGERY     CARDIAC CATHETERIZATION     CARDIOVERSION N/A 08/29/2018   Procedure: CARDIOVERSION;  Surgeon: Hester Wolm PARAS, MD;  Location: [facility] ORS;  Service: Cardiovascular;  Laterality: N/A;   CARDIOVERSION N/A 09/24/2018   Procedure: CARDIOVERSION;  Surgeon: Hester Wolm PARAS, MD;  Location: [facility] ORS;  Service: Cardiovascular;  Laterality: N/A;   COLONOSCOPY WITH PROPOFOL  N/A 10/05/2015   Procedure: COLONOSCOPY WITH PROPOFOL ;  Surgeon: Gladis RAYMOND Mariner, MD;  Location: [facility] ENDOSCOPY;  Service:  Endoscopy;  Laterality: N/A;   COLONOSCOPY WITH PROPOFOL  N/A 11/01/2020   Procedure: COLONOSCOPY WITH PROPOFOL ;  Surgeon: Therisa Bi, MD;  Location: [facility] ENDOSCOPY;  Service: Gastroenterology;  Laterality: N/A;   CORONARY STENT INTERVENTION N/A 12/22/2020   Procedure: CORONARY STENT INTERVENTION;  Surgeon: Ammon Blunt, MD;  Location: [facility] INVASIVE CV LAB;  Service: Cardiovascular;  Laterality: N/A;   ESOPHAGOGASTRODUODENOSCOPY N/A 11/01/2020   Procedure: ESOPHAGOGASTRODUODENOSCOPY (EGD);  Surgeon: Therisa Bi, MD;  Location: [facility] ENDOSCOPY;  Service: Gastroenterology;  Laterality: N/A;   ESOPHAGOGASTRODUODENOSCOPY (EGD) WITH PROPOFOL  N/A 04/01/2018   Procedure: ESOPHAGOGASTRODUODENOSCOPY (EGD) WITH PROPOFOL ;  Surgeon: Mariner Gladis RAYMOND, MD;  Location: [facility] ENDOSCOPY;  Service: Endoscopy;  Laterality: N/A;   ESOPHAGOGASTRODUODENOSCOPY (EGD) WITH PROPOFOL  N/A 06/01/2022   Procedure: ESOPHAGOGASTRODUODENOSCOPY (EGD) WITH PROPOFOL ;  Surgeon: Therisa Bi, MD;  Location: [facility] ENDOSCOPY;  Service: Gastroenterology;  Laterality: N/A;   EYE SURGERY     HERNIA REPAIR     JOINT REPLACEMENT Bilateral    hips  RT+  LEFT X2    LEFT HEART CATH AND CORONARY ANGIOGRAPHY N/A 12/22/2020   Procedure: LEFT HEART CATH AND CORONARY ANGIOGRAPHY;  Surgeon: Ammon Blunt, MD;  Location: [facility] INVASIVE CV LAB;  Service: Cardiovascular;  Laterality: N/A;   LEFT HEART CATH AND CORONARY ANGIOGRAPHY Left 08/23/2021   Procedure: LEFT HEART CATH AND CORONARY ANGIOGRAPHY;  Surgeon: Hester Wolm PARAS, MD;  Location:  [facility] INVASIVE CV LAB;  Service: Cardiovascular;  Laterality: Left;   LUMBAR LAMINECTOMY/DECOMPRESSION MICRODISCECTOMY Left 09/13/2016   Procedure: Microdiscectomy - Lumbar two-three,  Lumbar three- - left;  Surgeon: Onetha Kuba, MD;  Location: [facility] OR;  Service: Neurosurgery;  Laterality: Left;   SPINAL CORD STIMULATOR INSERTION  07/08/2019   TONSILLECTOMY     Patient Active Problem List   Diagnosis  Date Noted   Influenza A virus present 02/25/2024   Gynecomastia, male 01/07/2024   Lip swelling 07/04/2023   Diabetes mellitus treated with oral medication (HCC) 04/05/2023   COPD (chronic obstructive pulmonary disease) (HCC) 10/31/2021   Shortness of breath on exertion 08/04/2021   Diabetes mellitus with proteinuria (HCC) 04/04/2021   GERD without esophagitis 04/02/2021   Coronary artery disease 12/28/2020   History of non-ST elevation myocardial infarction (NSTEMI) 12/21/2020   Pain in right shin 10/25/2020   Peripheral vascular disease 08/06/2020   Chronic pain syndrome 07/06/2020   Cervical facet joint syndrome 04/08/2020   Spinal cord stimulator status 12/11/2019   Elevated TSH 08/20/2019   CKD (chronic kidney disease) stage 3, GFR 30-59 ml/min (HCC) 01/19/2019   Acquired thrombophilia 01/19/2019   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   Postlaminectomy syndrome, lumbar region 01/16/2019   History of fusion of lumbar spine (L2-L5) 01/16/2019   Chronic radicular lumbar pain 01/16/2019   HNP (herniated nucleus pulposus), lumbar 04/29/2018   Advanced care planning/counseling discussion 09/28/2016   Spinal stenosis, lumbar region, 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/13/2016   Hyperlipidemia associated with type 2 diabetes mellitus (HCC) 07/14/2015   Symptomatic anemia 06/30/2015   Benign prostatic hyperplasia without lower urinary tract symptoms 06/02/2015   OSA (obstructive sleep apnea) 03/23/2015   Hypertension associated with diabetes (HCC) 09/28/2014   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HCC) 09/28/2014   H/O prior ablation treatment 10/19/2011   AF (paroxysmal atrial fibrillation) (HCC) 10/19/2011   PCP: Valerio Melanie DASEN, NP  REFERRING PROVIDER: Valerio Melanie DASEN, NP   REFERRING DIAG: R26.89 (ICD-10-CM) - Imbalance   RATIONALE FOR EVALUATION AND TREATMENT: Rehabilitation  THERAPY DIAG: Muscle weakness (generalized)  Other low back pain  Unsteadiness on  feet  ONSET DATE: 01/25/23 (acute on chronic)  FOLLOW-UP APPT SCHEDULED WITH REFERRING PROVIDER: Yes   FROM INITIAL EVALUATION SUBJECTIVE:                                                                                                                                                                                         SUBJECTIVE STATEMENT:  Imbalance and LE weakness;  PERTINENT HISTORY:  Lower extremity weakness in patient with 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 He reports weakness in both legs and numbness in toes post surgery in 2018. He experienced a fall on 01/25/2023 at home, resulting in a L hip injury. No residual pain in hip at this time. No additional falls since December. He reports progressive LE weakness impairing his functional ability at home. He is known to this clinic from prior episodes of care for similar concerns.  Prior history: 11/02/22 Patient reports to physical therapy today with a chief concern of muscle weakness and imbalance. Patient reports progressive worsening in balance and muscular strength. He states that he has loss of sensation in his toes he reports they are asleep. Patient reports pain in the anterior thigh and bilateral calves. He currently uses an loftstand crutch in his RUE. Lately patient has reported difficulty with dressing LE, performing fine motor movements with left hand. He denies falls, saddle parasthesia, nausea, vomitting, night sweats.    10/17/2022 Patient reported he is not doing well lately. He has been seeing Dr. Clois. He reports his legs are so weak that he can hardly walk, so he has been using a crutch for the last  1-2 years. Reported numbness and loss of sensation in his toes. He also reported that recently his left hand has began to has some numbness to where it feels like it is asleep. Denies pain, loss of sensation, coldness, burning. Denies falls.  Imaging: NCS conducted on 11/12/2018: Abnormal study. 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 There is also preliminary evidence of a superimposed left lower lumbosacral polyradiculopathy, based on NCV and distal needle exam results. I cannot test lumbar paraspinals due to patient on Eliquis .    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 Yes. 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  Recent memory is intact.  Remote memory is intact.  Attention span and concentration are intact.  Expressive speech is intact.  Patient's fund of knowledge is within normal limits for educational level.    Gross Musculoskeletal Assessment Tremor: None Bulk: Normal Tone: Normal  Posture: Forward head and rounded shoulders  AROM Deferred  specific measurements. Functional motion intact;  LE MMT: MMT (out of 5) Right  Left   Hip flexion    Hip extension    Hip abduction (seated)    Hip adduction (seated    Hip internal rotation    Hip external rotation    Knee flexion (seated)    Knee extension    Ankle dorsiflexion    Ankle plantarflexion    Ankle inversion    Ankle eversion    (* = pain; Blank rows = not tested)  Transfers: Assistive device utilized: Single Lofstrand RUE  Sit to stand: Modified independence Stand to sit: Modified independence Chair to chair: Modified independence Floor: Deferred  Stairs: Level of Assistance: CGA Stair Negotiation Technique: Alternating Pattern  with Bilateral Rails Number of Stairs: 4  Height of Stairs: 6  Comments: Slow and labored ascending/descending. No overt LOB;  Gait: Gait pattern: decreased step length- Right, decreased step length- Left, trunk flexed, poor foot clearance- Right, and poor foot clearance- Left Distance walked: 150' over the course of evaluation Assistive device utilized: Single Lofstrand in RUE Level of assistance: Modified independence Comments: Slow and labored ambulation. Decreased self selected speed  ABC: 39.4%  Functional Outcome Measures  Results Comments  BERG 52/56 Mild balance deficits  DGI    FGA    TUG 13.6 seconds no crutch 16.2 seconds with crutch Increased fall risk  5TSTS 13.9 seconds WNL  6 Minute Walk Test    10 Meter Gait Speed Self-selected: 20.2s = 0.5 m/s; Fastest: 16s = 0.63 m/s Below community ambulation speed (0.8 m/s)    TODAY'S  TREATMENT: 03/02/2024  Not performed   PATIENT EDUCATION:  Education details: Plan of care Person educated: Patient Education method: Explanation Education comprehension: verbalized understanding   HOME EXERCISE PROGRAM:  HEP from prior episode of care Access Code: [email or web address] URL: [email or web address] Date: 05/31/2023 Prepared by: Selinda Eck  Exercises - Seated Long Arc Quad  - 1 x daily - 3-4 x weekly - 3 sets - 10 reps - 2s hold - Seated Hip Abduction with Resistance  - 1 x daily - 3-4 x weekly - 3 sets - 10 reps - 2s hold - Heel Raises with Counter Support  - 1 x daily - 3-4 x weekly - 3 sets - 10 reps - 2s hold - Mini Squat with Counter Support  - 1 x daily - 3-4 x weekly - 3 sets - 10 reps - Semi-Tandem Balance at [employer] Eyes Open  - 1 x daily - 7 x weekly - 2-3 sets - 10 reps - 10-30 hold   ASSESSMENT:  CLINICAL IMPRESSION:      Patient arrives to treatment session very motivated to participate. Session focused on BLE strengthening today. He continues to require multiple rest breaks throughout. Pt demonstrated appropriate effort throughout the session. Plan to progress strengthening at future sessions as pt can tolerate. He will benefit from PT services to address deficits in strength, balance, and mobility in order to improve function at home and decrease his risk for falls.    OBJECTIVE IMPAIRMENTS: Abnormal gait, decreased balance, difficulty walking, decreased strength, impaired sensation, and pain.   ACTIVITY LIMITATIONS: bending, standing, squatting, stairs, transfers, and caring for others  PARTICIPATION LIMITATIONS: meal prep, cleaning, laundry, shopping, and community activity  PERSONAL FACTORS: Age, Past/current experiences, Time since onset of injury/illness/exacerbation, and 3+ comorbidities: generalized polyneuropathy, COPD, DM, NSTEMI, and spinal stenosis are also affecting patient's functional outcome.   REHAB POTENTIAL: Fair  CLINICAL DECISION MAKING: Unstable/unpredictable  EVALUATION COMPLEXITY: High   GOALS: Goals reviewed with patient? No  SHORT TERM GOALS: Target date: 07/10/2023  Pt will be independent with HEP in order to improve strength and balance in order to decrease fall risk and improve function at home. Baseline:  Goal status: MET   LONG TERM GOALS: Target date: 11/15/2023  Pt will improve ABC by at least 13% in order to demonstrate clinically significant improvement in balance confidence.  Baseline: 20%; 07/10/23: 30%, 08/13/23: 36.25%; 12/20/23: 39.4% Goal status: ACHIEVED  2.  Pt will improve BERG by at least 3 points in order to demonstrate clinically significant improvement in balance.   Baseline: 40/56; 07/10/23: 50/56; 08/13/23: 52/56; 12/20/23: 52/56; Goal status: ACHIEVED  3.  Pt will increase self-selected to at least 0.6 m/s in order to demonstrate clinically significant improvement in limited community ambulation.         Baseline: self-selected: 0.49 m/s; 07/10/23: 0.52 m/s; 08/13/23: 0.49 m/s; 12/20/23: 0.50 m/s; Goal status: PARTIALLY MET;  4. Pt will be able to complete 5TSTS without UE support in order to demonstrate clinically significant improvement in LE strength. Baseline: Unable to perform sit to stand without heavy UE assist; 07/10/23: 11.4s; 08/13/23: 12.72 s; 12/20/23: 13.9s; Goal status: ACHIEVED  5. Pt will decrease TUG to below 14 seconds/decrease in order to demonstrate decreased fall risk.  Baseline: 22.2s; 07/10/23: 14.0s; 08/13/23: 24.53s no crutch, 16.20s with crutch; 12/20/23: 13.6s no crutch, 16.2s with crutch Goal status: ONGOING  PLAN: PT FREQUENCY: 2x/week  PT DURATION: 12 weeks  PLANNED INTERVENTIONS: Therapeutic exercises, Therapeutic activity, Neuromuscular re-education, Balance training, Gait training, Patient/Family education, Self Care, Joint mobilization, Joint manipulation, Vestibular training, Canalith repositioning, Orthotic/Fit training, DME  instructions, Electrical stimulation, Cryotherapy, Moist heat, Manual therapy, and Re-evaluation.  PLAN FOR NEXT SESSION: progress strengthening exercises, walking/ambulation speed and endurance tasks, modify/progress HEP as needed;   Glenna Brunkow D Sherrey North PT, DPT, GCS  Physical Therapist - [facility] [place] 5:09 PM,03/02/24 "

## 2024-03-03 ENCOUNTER — Ambulatory Visit: Payer: Self-pay

## 2024-03-03 ENCOUNTER — Ambulatory Visit: Attending: Neurology

## 2024-03-03 ENCOUNTER — Encounter: Payer: Self-pay | Admitting: Nurse Practitioner

## 2024-03-03 ENCOUNTER — Ambulatory Visit (INDEPENDENT_AMBULATORY_CARE_PROVIDER_SITE_OTHER): Admitting: Nurse Practitioner

## 2024-03-03 VITALS — BP 143/70 | HR 76 | Temp 97.8°F | Ht 73.0 in | Wt 181.6 lb

## 2024-03-03 DIAGNOSIS — R051 Acute cough: Secondary | ICD-10-CM | POA: Diagnosis not present

## 2024-03-03 DIAGNOSIS — M5459 Other low back pain: Secondary | ICD-10-CM

## 2024-03-03 DIAGNOSIS — M6281 Muscle weakness (generalized): Secondary | ICD-10-CM

## 2024-03-03 DIAGNOSIS — R2681 Unsteadiness on feet: Secondary | ICD-10-CM

## 2024-03-03 MED ORDER — ALBUTEROL SULFATE HFA 108 (90 BASE) MCG/ACT IN AERS: 2.0000 | INHALATION_SPRAY | Freq: Four times a day (QID) | RESPIRATORY_TRACT | 0 refills | Status: AC | PRN

## 2024-03-03 MED ORDER — PREDNISONE 10 MG PO TABS: 50.0000 mg | ORAL_TABLET | Freq: Every day | ORAL | 0 refills | Status: AC

## 2024-03-03 NOTE — Telephone Encounter (Signed)
 FYI Only or Action Required?: FYI only for provider: appointment scheduled on 03/04/23.  Patient was last seen in primary care on 02/25/2024 by Valerio Melanie DASEN, NP.  Called Nurse Triage reporting Cough.  Symptoms began 9 days ago .  Interventions attempted: Prescription medications: abx .  Symptoms are: gradually worsening.  Triage Disposition: See Physician Within 24 Hours  Patient/caregiver understands and will follow disposition?: Message from [facility] sent at 03/03/2024  8:34 AM EST  Reason for Triage: Worsening symptoms since last visit, wheezing, coughing. Sore throat.   Reason for Disposition  [1] Known COPD or other severe lung disease (i.e., bronchiectasis, cystic fibrosis, lung surgery) AND [2] symptoms getting worse (i.e., increased sputum purulence or amount, increased breathing difficulty  Answer Assessment - Initial Assessment Questions 1. ONSET: When did the cough begin?      9 days ago  2. SEVERITY: How bad is the cough today?      Cough Kept slept all night  3. SPUTUM: Describe the color of your sputum (e.g., none, dry cough; clear, white, yellow, green)     Clear  4. HEMOPTYSIS: Are you coughing up any blood? If Yes, ask: How much? (e.g., flecks, streaks, tablespoons, etc.)     no 5. DIFFICULTY BREATHING: Are you having difficulty breathing? If Yes, ask: How bad is it? (e.g., mild, moderate, severe)      SOB has worsened  6. FEVER: Do you have a fever? If Yes, ask: What is your temperature, how was it measured, and when did it start?     no 7. CARDIAC HISTORY: Do you have any history of heart disease? (e.g., heart attack, congestive heart failure)      H/I MI 2016, HTN, 8. LUNG HISTORY: Do you have any history of lung disease?  (e.g., pulmonary embolus, asthma, emphysema)     COPD 10. OTHER SYMPTOMS: Do you have any other symptoms? (e.g., runny nose, wheezing, chest pain)       Sore, wheezing, weak, congested cough, body  aches  Protocols used: Cough - Acute Productive-A-AH

## 2024-03-03 NOTE — Progress Notes (Signed)
 "  BP (!) 143/70   Pulse 76   Temp 97.8 F (36.6 C) (Oral)   Ht 6' 1 (1.854 m)   Wt 181 lb 9.6 oz (82.4 kg)   SpO2 98%   BMI 23.96 kg/m    Subjective:    Patient ID: Sean Clancy Raddle., male    DOB: Jul 12, 1940, 84 y.o.   MRN: [id number]  HPI: Sean Niemczyk. is a 83 y.o. male  Chief Complaint  Patient presents with   Influenza    Patient states he tested positive for the flu last week, 02/25/24. States he is still feeling really  bad and it coughing a lot. States it is keeping him up at night.    UPPER RESPIRATORY TRACT INFECTION Worst symptom: Patient ws diagnosed with the flu last week.  He coughed all night long.  He doesn't feel any better than he did last week.  He does have COPD.  Does have some SOB, fatigue and feels a little worse than normal.  He does not have albuterol .  He is also taking an OTC decongestant.  No fevers or wheezing.  Relevant past medical, surgical, family and social history reviewed and updated as indicated. Interim medical history since our last visit reviewed. Allergies and medications reviewed and updated.  Review of Systems  Constitutional:  Positive for fatigue. Negative for fever.  HENT:  Positive for congestion.   Respiratory:  Positive for cough and shortness of breath. Negative for wheezing.     Per HPI unless specifically indicated above     Objective:    BP (!) 143/70   Pulse 76   Temp 97.8 F (36.6 C) (Oral)   Ht 6' 1 (1.854 m)   Wt 181 lb 9.6 oz (82.4 kg)   SpO2 98%   BMI 23.96 kg/m   Wt Readings from Last 3 Encounters:  03/03/24 181 lb 9.6 oz (82.4 kg)  02/25/24 182 lb 14.4 oz (83 kg)  02/20/24 186 lb 4 oz (84.5 kg)    Physical Exam Vitals and nursing note reviewed.  Constitutional:      General: He is not in acute distress.    Appearance: He is ill-appearing. He is not toxic-appearing or diaphoretic.  HENT:     Head: Normocephalic.     Right Ear: External ear normal. A middle ear effusion is present.     Left Ear:  External ear normal. A middle ear effusion is present.     Nose: Congestion and rhinorrhea present.     Mouth/Throat:     Mouth: Mucous membranes are moist.     Pharynx: Posterior oropharyngeal erythema present. No oropharyngeal exudate.  Eyes:     General:        Right eye: No discharge.        Left eye: No discharge.     Extraocular Movements: Extraocular movements intact.     Conjunctiva/sclera: Conjunctivae normal.     Pupils: Pupils are equal, round, and reactive to light.  Cardiovascular:     Rate and Rhythm: Normal rate and regular rhythm.     Heart sounds: No murmur heard. Pulmonary:     Effort: Pulmonary effort is normal. No respiratory distress.     Breath sounds: Normal breath sounds. No wheezing, rhonchi or rales.  Abdominal:     General: Abdomen is flat. Bowel sounds are normal.  Musculoskeletal:     Cervical back: Normal range of motion and neck supple.  Skin:    General: Skin is warm  and dry.     Capillary Refill: Capillary refill takes less than 2 seconds.  Neurological:     General: No focal deficit present.     Mental Status: He is alert and oriented to person, place, and time.  Psychiatric:        Mood and Affect: Mood normal.        Behavior: Behavior normal.        Thought Content: Thought content normal.        Judgment: Judgment normal.     Results for orders placed or performed in visit on 02/25/24  Influenza A & B (STAT)   Collection Time: 02/25/24 10:01 AM  Result Value Ref Range   Influenza A Positive (A) Negative   Influenza B Negative Negative  POC COVID-19   Collection Time: 02/25/24 10:24 AM  Result Value Ref Range   SARS Coronavirus 2 Ag Negative Negative   *Note: Due to a large number of results and/or encounters for the requested time period, some results have not been displayed. A complete set of results can be found in Results Review.      Assessment & Plan:   Problem List Items Addressed This Visit   None Visit Diagnoses        Acute cough    -  Primary   Secondary to Flu. Will treat with Prednisone  and albuterol  given for SOB and cough. Complete augmentin  course.  Continue with OTC decongestant.        Follow up plan: No follow-ups on file.      "

## 2024-03-05 ENCOUNTER — Ambulatory Visit: Attending: Neurology

## 2024-03-05 DIAGNOSIS — R2681 Unsteadiness on feet: Secondary | ICD-10-CM | POA: Diagnosis present

## 2024-03-05 DIAGNOSIS — M6281 Muscle weakness (generalized): Secondary | ICD-10-CM | POA: Diagnosis present

## 2024-03-05 DIAGNOSIS — M5459 Other low back pain: Secondary | ICD-10-CM | POA: Insufficient documentation

## 2024-03-05 NOTE — Therapy (Signed)
 " OUTPATIENT PHYSICAL THERAPY BALANCE EVALUATION   Patient Name: Sean Reyes. MRN: [id number] DOB:08-21-1940, 84 y.o., male Today's Date: 03/07/2024  END OF SESSION:  PT End of Session - 03/07/24 1036     Visit Number 1    Number of Visits 25    Date for Recertification  05/28/24    Authorization Type eval: 03/05/24    Authorization Time Period Medicare A&B 2025  VL: Based on MN  No auth req    PT Start Time 1319    PT Stop Time 1400    PT Time Calculation (min) 41 min    Equipment Utilized During Treatment Gait belt    Activity Tolerance Patient tolerated treatment well;Patient limited by fatigue;Patient limited by pain    Behavior During Therapy WFL for tasks assessed/performed         Past Medical History:  Diagnosis Date   Anemia    Anxiety    Arthritis    Arthritis of neck    Atrial fibrillation (HCC)    Cataracts, bilateral    Complication of anesthesia    pt reports low BP's after surgery at [facility] and difficulty awakening   Depression    Diabetes (HCC)    dx 6-8 yrs ago   Dysrhythmia    a-fib   GERD (gastroesophageal reflux disease)    OCC TAKES ALKA SELTZER   History of kidney stones    10-15 yrs ago   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   Hyperlipidemia    Hypertension    Myocardial infarction (HCC) 12/2020   Nocturia    S/P ablation of atrial fibrillation    Ablative therapy   Sleep apnea    CPAP    Spinal stenosis    Tachycardia, unspecified    Past Surgical History:  Procedure Laterality Date   ABLATION     ANTERIOR LAT LUMBAR FUSION N/A 06/27/2017   Procedure: Anterior Lateral Lumbar Interbody  Fusion - Lumbar Two-Lumbar Three - Lumbar Three-Lumbar Four, Posterior Lumbar Interbody Fusion Lumbar Four- Five;  Surgeon: Onetha Kuba, MD;  Location: [facility] OR;  Service: Neurosurgery;  Laterality: N/A;  Anterior Lateral Lumbar Interbody  Fusion - Lumbar Two-Lumbar Three - Lumbar Three-Lumbar Four, Posterior Lumbar Interbody Fusion  Lumbar Four- Five   BACK SURGERY     CARDIAC CATHETERIZATION     CARDIOVERSION N/A 08/29/2018   Procedure: CARDIOVERSION;  Surgeon: Hester Wolm PARAS, MD;  Location: [facility] ORS;  Service: Cardiovascular;  Laterality: N/A;   CARDIOVERSION N/A 09/24/2018   Procedure: CARDIOVERSION;  Surgeon: Hester Wolm PARAS, MD;  Location: [facility] ORS;  Service: Cardiovascular;  Laterality: N/A;   COLONOSCOPY WITH PROPOFOL  N/A 10/05/2015   Procedure: COLONOSCOPY WITH PROPOFOL ;  Surgeon: Gladis RAYMOND Mariner, MD;  Location: [facility] ENDOSCOPY;  Service: Endoscopy;  Laterality: N/A;   COLONOSCOPY WITH PROPOFOL  N/A 11/01/2020   Procedure: COLONOSCOPY WITH PROPOFOL ;  Surgeon: Therisa Bi, MD;  Location: [facility] ENDOSCOPY;  Service: Gastroenterology;  Laterality: N/A;   CORONARY STENT INTERVENTION N/A 12/22/2020   Procedure: CORONARY STENT INTERVENTION;  Surgeon: Ammon Blunt, MD;  Location: [facility] INVASIVE CV LAB;  Service: Cardiovascular;  Laterality: N/A;   ESOPHAGOGASTRODUODENOSCOPY N/A 11/01/2020   Procedure: ESOPHAGOGASTRODUODENOSCOPY (EGD);  Surgeon: Therisa Bi, MD;  Location: [facility] ENDOSCOPY;  Service: Gastroenterology;  Laterality: N/A;   ESOPHAGOGASTRODUODENOSCOPY (EGD) WITH PROPOFOL  N/A 04/01/2018   Procedure: ESOPHAGOGASTRODUODENOSCOPY (EGD) WITH PROPOFOL ;  Surgeon: Mariner Gladis RAYMOND, MD;  Location: [facility] ENDOSCOPY;  Service: Endoscopy;  Laterality: N/A;  ESOPHAGOGASTRODUODENOSCOPY (EGD) WITH PROPOFOL  N/A 06/01/2022   Procedure: ESOPHAGOGASTRODUODENOSCOPY (EGD) WITH PROPOFOL ;  Surgeon: Therisa Bi, MD;  Location: [facility] ENDOSCOPY;  Service: Gastroenterology;  Laterality: N/A;   EYE SURGERY     HERNIA REPAIR     JOINT REPLACEMENT Bilateral    hips  RT+  LEFT X2    LEFT HEART CATH AND CORONARY ANGIOGRAPHY N/A 12/22/2020   Procedure: LEFT HEART CATH AND CORONARY ANGIOGRAPHY;  Surgeon: Ammon Blunt, MD;  Location: [facility] INVASIVE CV LAB;  Service: Cardiovascular;  Laterality: N/A;   LEFT HEART CATH AND  CORONARY ANGIOGRAPHY Left 08/23/2021   Procedure: LEFT HEART CATH AND CORONARY ANGIOGRAPHY;  Surgeon: Hester Wolm PARAS, MD;  Location: [facility] INVASIVE CV LAB;  Service: Cardiovascular;  Laterality: Left;   LUMBAR LAMINECTOMY/DECOMPRESSION MICRODISCECTOMY Left 09/13/2016   Procedure: Microdiscectomy - Lumbar two-three,  Lumbar three- - left;  Surgeon: Onetha Kuba, MD;  Location: [facility] OR;  Service: Neurosurgery;  Laterality: Left;   SPINAL CORD STIMULATOR INSERTION  07/08/2019   TONSILLECTOMY     Patient Active Problem List   Diagnosis Date Noted   Influenza A virus present 02/25/2024   Gynecomastia, male 01/07/2024   Lip swelling 07/04/2023   Diabetes mellitus treated with oral medication (HCC) 04/05/2023   COPD (chronic obstructive pulmonary disease) (HCC) 10/31/2021   Shortness of breath on exertion 08/04/2021   Diabetes mellitus with proteinuria (HCC) 04/04/2021   GERD without esophagitis 04/02/2021   Coronary artery disease 12/28/2020   History of non-ST elevation myocardial infarction (NSTEMI) 12/21/2020   Pain in right shin 10/25/2020   Peripheral vascular disease 08/06/2020   Chronic pain syndrome 07/06/2020   Cervical facet joint syndrome 04/08/2020   Spinal cord stimulator status 12/11/2019   Elevated TSH 08/20/2019   CKD (chronic kidney disease) stage 3, GFR 30-59 ml/min (HCC) 01/19/2019   Acquired thrombophilia 01/19/2019   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   Postlaminectomy syndrome, lumbar region 01/16/2019   History of fusion of lumbar spine (L2-L5) 01/16/2019   Chronic radicular lumbar pain 01/16/2019   HNP (herniated nucleus pulposus), lumbar 04/29/2018   Advanced care planning/counseling discussion 09/28/2016   Spinal stenosis, lumbar region, 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/13/2016   Hyperlipidemia associated with type 2 diabetes mellitus (HCC) 07/14/2015   Symptomatic anemia 06/30/2015   Benign prostatic hyperplasia without lower urinary tract symptoms  06/02/2015   OSA (obstructive sleep apnea) 03/23/2015   Hypertension associated with diabetes (HCC) 09/28/2014   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HCC) 09/28/2014   H/O prior ablation treatment 10/19/2011   AF (paroxysmal atrial fibrillation) (HCC) 10/19/2011   PCP: Valerio Melanie DASEN, NP  REFERRING PROVIDER: Valerio Melanie DASEN, NP   REFERRING DIAG: R26.89 (ICD-10-CM) - Imbalance   RATIONALE FOR EVALUATION AND TREATMENT: Rehabilitation  THERAPY DIAG: Muscle weakness (generalized) - Plan: PT plan of care cert/re-cert  Other low back pain - Plan: PT plan of care cert/re-cert  Unsteadiness on feet - Plan: PT plan of care cert/re-cert  ONSET DATE: 01/25/23 (acute on chronic)  FOLLOW-UP APPT SCHEDULED WITH REFERRING PROVIDER: Yes   FROM INITIAL EVALUATION SUBJECTIVE:  SUBJECTIVE STATEMENT:  Imbalance and LE weakness;  PERTINENT HISTORY:  Pt previously seen by this clinic. He was referred for ongoing BLE weakness, imbalance, and deconditioning. He was sick recently with the flu and believes that it has contributed to worsening weakness. Since having the flu he has had an ongoing cough and DOE. No new falls since his last episode of care.  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 He reports weakness in both legs and numbness in toes post surgery in 2018. He experienced a fall on 01/25/2023 at home, resulting in a L hip injury.  No residual pain in hip at this time. No additional falls since December. He reports progressive LE weakness impairing his functional ability at home. He is known to this clinic from prior episodes of care for similar concerns.  Prior history: 11/02/22 Patient reports to physical therapy today with a chief concern of muscle weakness and imbalance. Patient reports progressive worsening in balance and muscular strength. He states that he has loss of sensation in his toes he reports they are asleep. Patient reports pain in the anterior thigh and bilateral calves. He currently uses an loftstand crutch in his RUE. Lately patient has reported difficulty with dressing LE, performing fine motor movements with left hand. He denies falls, saddle parasthesia, nausea, vomitting, night sweats.    10/17/2022 Patient reported he is not doing well lately. He has been seeing Dr. Clois. He reports his legs are so weak that he can hardly walk, so he has been using a crutch for the last 1-2 years. Reported numbness and loss of sensation in his toes. He also reported that recently his left hand has began to has some numbness to where it feels like it is asleep. Denies pain, loss of sensation, coldness, burning. Denies falls.  Imaging: NCS conducted on 11/12/2018: Abnormal study. 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 There is also preliminary evidence of a superimposed left lower lumbosacral polyradiculopathy, based on NCV and distal needle exam results. I cannot test lumbar paraspinals due to patient on Eliquis .    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  Recent memory is intact.  Remote memory is intact.  Attention span and concentration are intact.  Expressive speech is intact.  Patient's fund of knowledge is within normal limits for educational level.    Gross Musculoskeletal Assessment Tremor: None Bulk: Decreased BLE bulk Tone: Normal  Posture: Forward head and rounded shoulders, flat lower back;  AROM Deferred specific measurements. Functional motion intact but spinal mobility very limited;  LE MMT: MMT (out of 5) Right  Left   Hip flexion 4+ 4+  Hip extension    Hip abduction (seated) 4 4  Hip adduction (seated 4 4  Hip internal rotation    Hip external rotation    Knee flexion (seated) 5 5  Knee extension 5 5  Ankle dorsiflexion 4+ 4+  Ankle plantarflexion Active Active  (* = pain; Blank rows = not tested)  Transfers: Assistive device utilized: Single Lofstrand RUE  Sit to stand: Modified independence Stand to sit: Modified independence Chair to  chair: Modified independence Floor: Deferred  Stairs: Deferred  Gait: Gait pattern: decreased step length- Right, decreased step length- Left, trunk flexed, poor foot clearance- Right, and poor foot clearance- Left Distance walked: 150' over the course of evaluation Assistive device utilized: Single Lofstrand in RUE Level of assistance: Modified independence Comments: Slow and labored ambulation. Decreased self selected speed  Functional Outcome Measures  03/05/24 Comments  BERG 45/56; Increased fall risk  DGI    ABC    TUG 21.1 seconds no crutch, 16.1 seconds with crutch Increased fall risk  5TSTS 16.3s Increased fall risk  6 Minute Walk Test    10 Meter Gait Speed Self-selected: 20.9s = 0.48 m/s; Fastest: 17.4s = 0.57 m/s Below community ambulation speed (0.8 m/s)   Vitals: Seated: BP: 129/64 mmHg, HR: 76 bpm, SpO2: 100%   TODAY'S TREATMENT: Not performed   PATIENT EDUCATION:  Education details: Plan of care and examination findings; Person educated: Patient Education method: Explanation Education comprehension: verbalized understanding   HOME EXERCISE PROGRAM:  HEP from prior episode of care Access Code: [email or web address] URL: [email or web address] Date: 05/31/2023 Prepared by: Selinda Eck  Exercises - Seated Long Arc Quad  - 1 x daily - 3-4 x weekly - 3 sets - 10 reps - 2s hold - Seated Hip Abduction with Resistance  - 1 x daily - 3-4 x weekly - 3 sets - 10 reps - 2s hold - Heel Raises with Counter Support  - 1 x daily - 3-4 x weekly - 3 sets - 10 reps - 2s hold - Mini Squat with Counter Support  - 1 x daily - 3-4 x weekly - 3 sets - 10 reps - Semi-Tandem Balance at [employer] Eyes Open  - 1 x daily - 7 x weekly - 2-3 sets - 10 reps - 10-30 hold   ASSESSMENT:  CLINICAL IMPRESSION:    Patient is a pleasant 84 y.o. male who was seen today for physical therapy evaluation and treatment for balance. He is known to this clinic from prior episodes of care  for similar imbalance and LE weakness. Objectively patient scored 45/56 on BERG indicating he is at high risk of falls. Patient also demonstrated decreased self-selected walking speed and decreased LE strength on the TUG and 5TSTS. His walking pattern is impaired due to generalized polyneuropathy and weakness in BLE. Currently, he presents with poor balance, endurance  and decreased LE strength thus placing him at a high risk for falls. Based on today's performance patient would benefit from skilled physical therapy focused on improving LE strength and balance in order to demonstrate decreased fall risk improved functional mobility.   OBJECTIVE IMPAIRMENTS: Abnormal gait, decreased balance, difficulty walking, decreased strength, impaired sensation, and pain.   ACTIVITY LIMITATIONS: bending, standing, squatting, stairs, transfers, and caring for others  PARTICIPATION LIMITATIONS: meal prep, cleaning, laundry, shopping, and community activity  PERSONAL FACTORS: Age, Past/current experiences, Time since onset of injury/illness/exacerbation, and 3+ comorbidities: generalized polyneuropathy, COPD, DM, NSTEMI, and spinal stenosis are also affecting patient's functional outcome.   REHAB POTENTIAL: Fair    CLINICAL DECISION MAKING: Unstable/unpredictable  EVALUATION COMPLEXITY: High   GOALS: Goals reviewed with patient? No  SHORT TERM GOALS: Target date: 04/16/2024   Pt will be independent with HEP in order to improve strength and balance in order to decrease fall risk and improve function at home. Baseline:  Goal status: INITIAL   LONG TERM GOALS: Target date: 05/28/2024   Pt will improve ABC by at least 13% in order to demonstrate clinically significant improvement in balance confidence.  Baseline: 03/05/24: 40.6% Goal status: INITIAL  2.  Pt will improve BERG to at least 50/56 points in order to demonstrate clinically significant improvement in balance.   Baseline: 45/56 Goal status:  INITIAL  3.  Pt will increase self-selected to at least 0.6 m/s in order to demonstrate clinically significant improvement in limited community ambulation.         Baseline: self-selected: 0.48 m/s; Goal status: INITIAL  4. Pt will be able to complete 5TSTS in <14s in order to demonstrate clinically significant improvement in LE strength. Baseline: 16.3s Goal status: INITIAL  5. Pt will decrease TUG with Lofstrand crutch to below 14 seconds/decrease in order to demonstrate decreased fall risk.  Baseline: 16.1s Goal status: INITIAL  PLAN: PT FREQUENCY: 1-2x/week  PT DURATION: 12 weeks  PLANNED INTERVENTIONS: Therapeutic exercises, Therapeutic activity, Neuromuscular re-education, Balance training, Gait training, Patient/Family education, Self Care, Joint mobilization, Joint manipulation, Vestibular training, Canalith repositioning, Orthotic/Fit training, DME instructions, Electrical stimulation, Cryotherapy, Moist heat, Manual therapy, and Re-evaluation.  PLAN FOR NEXT SESSION: progress strengthening exercises, walking/ambulation speed and endurance tasks, modify/progress HEP as needed;   Joshue Badal D Dyami Umbach PT, DPT, GCS  Physical Therapist - [place]  [facility] 10:56 AM,03/07/24 "

## 2024-03-06 ENCOUNTER — Ambulatory Visit

## 2024-03-10 ENCOUNTER — Ambulatory Visit

## 2024-03-11 NOTE — Therapy (Signed)
 " OUTPATIENT PHYSICAL THERAPY BALANCE TREATMENT   Patient Name: Sean Reyes. MRN: [id number] DOB:[birth year], 84 y.o., male Today's Date: 03/13/2024  END OF SESSION:  PT End of Session - 03/13/24 1202     Visit Number 2    Number of Visits 25    Date for Recertification  05/28/24    Authorization Type eval: 03/05/24    Authorization Time Period Medicare A&B 2025  VL: Based on [place]  No auth req    PT Start Time 1355    PT Stop Time 1438    PT Time Calculation (min) 43 min    Equipment Utilized During Treatment Gait belt    Activity Tolerance Patient tolerated treatment well;Patient limited by fatigue;Patient limited by pain    Behavior During Therapy WFL for tasks assessed/performed         Past Medical History:  Diagnosis Date   Anemia    Anxiety    Arthritis    Arthritis of neck    Atrial fibrillation (HCC)    Cataracts, bilateral    Complication of anesthesia    pt reports low BP's after surgery at [facility] and difficulty awakening   Depression    Diabetes (HCC)    dx 6-8 yrs ago   Dysrhythmia    a-fib   GERD (gastroesophageal reflux disease)    OCC TAKES ALKA SELTZER   History of kidney stones    10-15 yrs ago   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   Hyperlipidemia    Hypertension    Myocardial infarction (HCC) 12/2020   Nocturia    S/P ablation of atrial fibrillation    Ablative therapy   Sleep apnea    CPAP    Spinal stenosis    Tachycardia, unspecified    Past Surgical History:  Procedure Laterality Date   ABLATION     ANTERIOR LAT LUMBAR FUSION N/A 06/27/2017   Procedure: Anterior Lateral Lumbar Interbody  Fusion - Lumbar Two-Lumbar Three - Lumbar Three-Lumbar Four, Posterior Lumbar Interbody Fusion Lumbar Four- Five;  Surgeon: Onetha Kuba, MD;  Location: [facility] OR;  Service: Neurosurgery;  Laterality: N/A;  Anterior Lateral Lumbar Interbody  Fusion - Lumbar Two-Lumbar Three - Lumbar Three-Lumbar Four, Posterior Lumbar Interbody Fusion  Lumbar Four- Five   BACK SURGERY     CARDIAC CATHETERIZATION     CARDIOVERSION N/A 08/29/2018   Procedure: CARDIOVERSION;  Surgeon: Hester Wolm PARAS, MD;  Location: [facility] ORS;  Service: Cardiovascular;  Laterality: N/A;   CARDIOVERSION N/A 09/24/2018   Procedure: CARDIOVERSION;  Surgeon: Hester Wolm PARAS, MD;  Location: [facility] ORS;  Service: Cardiovascular;  Laterality: N/A;   COLONOSCOPY WITH PROPOFOL  N/A 10/05/2015   Procedure: COLONOSCOPY WITH PROPOFOL ;  Surgeon: Gladis RAYMOND Mariner, MD;  Location: [facility] ENDOSCOPY;  Service: Endoscopy;  Laterality: N/A;   COLONOSCOPY WITH PROPOFOL  N/A 11/01/2020   Procedure: COLONOSCOPY WITH PROPOFOL ;  Surgeon: Therisa Bi, MD;  Location: [facility] ENDOSCOPY;  Service: Gastroenterology;  Laterality: N/A;   CORONARY STENT INTERVENTION N/A 12/22/2020   Procedure: CORONARY STENT INTERVENTION;  Surgeon: Ammon Blunt, MD;  Location: [facility] INVASIVE CV LAB;  Service: Cardiovascular;  Laterality: N/A;   ESOPHAGOGASTRODUODENOSCOPY N/A 11/01/2020   Procedure: ESOPHAGOGASTRODUODENOSCOPY (EGD);  Surgeon: Therisa Bi, MD;  Location: [facility] ENDOSCOPY;  Service: Gastroenterology;  Laterality: N/A;   ESOPHAGOGASTRODUODENOSCOPY (EGD) WITH PROPOFOL  N/A 04/01/2018   Procedure: ESOPHAGOGASTRODUODENOSCOPY (EGD) WITH PROPOFOL ;  Surgeon: Mariner Gladis RAYMOND, MD;  Location: [facility] ENDOSCOPY;  Service: Endoscopy;  Laterality: N/A;  ESOPHAGOGASTRODUODENOSCOPY (EGD) WITH PROPOFOL  N/A 06/01/2022   Procedure: ESOPHAGOGASTRODUODENOSCOPY (EGD) WITH PROPOFOL ;  Surgeon: Therisa Bi, MD;  Location: [facility] ENDOSCOPY;  Service: Gastroenterology;  Laterality: N/A;   EYE SURGERY     HERNIA REPAIR     JOINT REPLACEMENT Bilateral    hips  RT+  LEFT X2    LEFT HEART CATH AND CORONARY ANGIOGRAPHY N/A 12/22/2020   Procedure: LEFT HEART CATH AND CORONARY ANGIOGRAPHY;  Surgeon: Ammon Blunt, MD;  Location: [facility] INVASIVE CV LAB;  Service: Cardiovascular;  Laterality: N/A;   LEFT HEART CATH AND  CORONARY ANGIOGRAPHY Left 08/23/2021   Procedure: LEFT HEART CATH AND CORONARY ANGIOGRAPHY;  Surgeon: Hester Wolm PARAS, MD;  Location: [facility] INVASIVE CV LAB;  Service: Cardiovascular;  Laterality: Left;   LUMBAR LAMINECTOMY/DECOMPRESSION MICRODISCECTOMY Left 09/13/2016   Procedure: Microdiscectomy - Lumbar two-three,  Lumbar three- - left;  Surgeon: Onetha Kuba, MD;  Location: [facility] OR;  Service: Neurosurgery;  Laterality: Left;   SPINAL CORD STIMULATOR INSERTION  07/08/2019   TONSILLECTOMY     Patient Active Problem List   Diagnosis Date Noted   Influenza A virus present 02/25/2024   Gynecomastia, male 01/07/2024   Lip swelling 07/04/2023   Diabetes mellitus treated with oral medication (HCC) 04/05/2023   COPD (chronic obstructive pulmonary disease) (HCC) 10/31/2021   Shortness of breath on exertion 08/04/2021   Diabetes mellitus with proteinuria (HCC) 04/04/2021   GERD without esophagitis 04/02/2021   Coronary artery disease 12/28/2020   History of non-ST elevation myocardial infarction (NSTEMI) 12/21/2020   Pain in right shin 10/25/2020   Peripheral vascular disease 08/06/2020   Chronic pain syndrome 07/06/2020   Cervical facet joint syndrome 04/08/2020   Spinal cord stimulator status 12/11/2019   Elevated TSH 08/20/2019   CKD (chronic kidney disease) stage 3, GFR 30-59 ml/min (HCC) 01/19/2019   Acquired thrombophilia 01/19/2019   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   Postlaminectomy syndrome, lumbar region 01/16/2019   History of fusion of lumbar spine (L2-L5) 01/16/2019   Chronic radicular lumbar pain 01/16/2019   HNP (herniated nucleus pulposus), lumbar 04/29/2018   Advanced care planning/counseling discussion 09/28/2016   Spinal stenosis, lumbar region, 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/13/2016   Hyperlipidemia associated with type 2 diabetes mellitus (HCC) 07/14/2015   Symptomatic anemia 06/30/2015   Benign prostatic hyperplasia without lower urinary tract symptoms  06/02/2015   OSA (obstructive sleep apnea) 03/23/2015   Hypertension associated with diabetes (HCC) 09/28/2014   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HCC) 09/28/2014   H/O prior ablation treatment 10/19/2011   AF (paroxysmal atrial fibrillation) (HCC) 10/19/2011   PCP: Valerio Melanie DASEN, NP  REFERRING PROVIDER: Valerio Melanie DASEN, NP   REFERRING DIAG: R26.89 (ICD-10-CM) - Imbalance   RATIONALE FOR EVALUATION AND TREATMENT: Rehabilitation  THERAPY DIAG: Other low back pain  Muscle weakness (generalized)  ONSET DATE: 01/25/23 (acute on chronic)  FOLLOW-UP APPT SCHEDULED WITH REFERRING PROVIDER: Yes   FROM INITIAL EVALUATION SUBJECTIVE:  SUBJECTIVE STATEMENT:  Imbalance and LE weakness;  PERTINENT HISTORY:  Pt previously seen by this clinic. He was referred for ongoing BLE weakness, imbalance, and deconditioning. He was sick recently with the flu and believes that it has contributed to worsening weakness. Since having the flu he has had an ongoing cough and DOE. No new falls since his last episode of care.  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 He reports weakness in both legs and numbness in toes post surgery in 2018. He experienced a fall on 01/25/2023 at home, resulting in a L hip injury. No residual pain in hip at this time. No additional falls since December. He reports progressive LE weakness impairing his functional  ability at home. He is known to this clinic from prior episodes of care for similar concerns.  Prior history: 11/02/22 Patient reports to physical therapy today with a chief concern of muscle weakness and imbalance. Patient reports progressive worsening in balance and muscular strength. He states that he has loss of sensation in his toes he reports they are asleep. Patient reports pain in the anterior thigh and bilateral calves. He currently uses an loftstand crutch in his RUE. Lately patient has reported difficulty with dressing LE, performing fine motor movements with left hand. He denies falls, saddle parasthesia, nausea, vomitting, night sweats.    10/17/2022 Patient reported he is not doing well lately. He has been seeing Dr. Clois. He reports his legs are so weak that he can hardly walk, so he has been using a crutch for the last 1-2 years. Reported numbness and loss of sensation in his toes. He also reported that recently his left hand has began to has some numbness to where it feels like it is asleep. Denies pain, loss of sensation, coldness, burning. Denies falls.  Imaging: NCS conducted on 11/12/2018: Abnormal study. 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 There is also preliminary evidence of a superimposed left lower lumbosacral polyradiculopathy, based on NCV and distal needle exam results. I cannot test lumbar paraspinals due to patient on Eliquis .    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  Recent memory is intact.  Remote memory is intact.  Attention span and concentration are intact.  Expressive speech is intact.  Patient's fund of knowledge is within normal limits for educational level.    Gross Musculoskeletal Assessment Tremor: None Bulk: Decreased BLE bulk Tone: Normal  Posture: Forward head and rounded shoulders, flat lower back;  AROM Deferred specific measurements. Functional motion intact but spinal mobility very limited;  LE MMT: MMT (out of 5) Right  Left   Hip flexion 4+ 4+  Hip extension    Hip abduction (seated) 4 4  Hip adduction (seated 4 4  Hip internal rotation    Hip external rotation    Knee flexion (seated) 5 5  Knee extension 5 5  Ankle dorsiflexion 4+ 4+  Ankle plantarflexion Active Active  (* = pain; Blank rows = not tested)  Transfers: Assistive device utilized: Single Lofstrand RUE  Sit to stand: Modified independence Stand to sit: Modified independence Chair to chair: Modified independence Floor: Deferred  Stairs: Deferred  Gait: Gait pattern: decreased step length- Right, decreased step  length- Left, trunk flexed, poor foot clearance- Right, and poor foot clearance- Left Distance walked: 150' over the course of evaluation Assistive device utilized: Single Lofstrand in RUE Level of assistance: Modified independence Comments: Slow and labored ambulation. Decreased self selected speed  Functional Outcome Measures  03/05/24 Comments  BERG 45/56; Increased fall risk  DGI    ABC    TUG 21.1 seconds no crutch, 16.1 seconds with crutch Increased fall risk  5TSTS 16.3s Increased fall risk  6 Minute Walk Test    10 Meter Gait Speed Self-selected: 20.9s = 0.48 m/s; Fastest: 17.4s = 0.57 m/s Below community ambulation speed (0.8 m/s)   Vitals: Seated: BP: 129/64 mmHg, HR: 76 bpm, SpO2: 100%   TODAY'S TREATMENT:   SUBJECTIVE: Pt reports that he has been having some L posterior hip and L low back pain upon arrival today. He had his spinal cord stimulator remotely adjusted with no notable improvement. Reports 7/10 pain upon arrival.    PAIN: 7/10 L low back and L posterior hip pain.   Ther-ex  NuStep L1-2 x 5 minutes for L hip AROM and light strengthening during interval history; Isometric clams 5s hold x 10; Isometric adductor squeezes 5s hold x 10; Hooklying alternating marches x 10; L hip gentle FADIR stretch 2 x 45s; L hip gentle single knee to chest stretch 2 x 45s;   Manual Therapy   STM to L posterior and lateral hip including TFL, glut max/med, and deep hip external rotators. Pt with palpable trigger point in L posterior hip/piriformis with concordant pain including pain radiating down posterior thigh;    PATIENT EDUCATION:  Education details: Plan of care and examination findings; Person educated: Patient Education method: Explanation Education comprehension: verbalized understanding   HOME EXERCISE PROGRAM:  HEP from prior episode of care Access Code: [email or web address] URL: [email or web address] Date: 05/31/2023 Prepared by: Selinda Eck  Exercises - Seated Long Arc Quad  - 1 x daily - 3-4 x weekly - 3 sets - 10 reps - 2s hold - Seated Hip Abduction with Resistance  - 1 x daily - 3-4 x weekly - 3 sets - 10 reps - 2s hold - Heel Raises with Counter Support  - 1 x daily - 3-4 x weekly - 3 sets -  10 reps - 2s hold - Mini Squat with Counter Support  - 1 x daily - 3-4 x weekly - 3 sets - 10 reps - Semi-Tandem Balance at [employer] Eyes Open  - 1 x daily - 7 x weekly - 2-3 sets - 10 reps - 10-30 hold   ASSESSMENT:  CLINICAL IMPRESSION:    Pt arrives today reporting L posterior hip and L low back pain which is significantly limited his mobility. Initiated STM with very light strengthening/AROM in order to decrease pain. Pt reports significant improvement in his pain at the end of his session and is able to ambulate with less pain, more upright posture, and improved self-selected speed at the end of the session. Plan to return to conditioning and balance training at next session as pt is able. Patient would benefit from skilled physical therapy focused on improving LE strength and balance in order to demonstrate decreased fall risk improved functional mobility.   OBJECTIVE IMPAIRMENTS: Abnormal gait, decreased balance, difficulty walking, decreased strength, impaired sensation, and pain.   ACTIVITY LIMITATIONS: bending, standing, squatting, stairs, transfers, and caring for others  PARTICIPATION LIMITATIONS: meal prep, cleaning, laundry, shopping, and community activity  PERSONAL FACTORS: Age, Past/current experiences, Time since onset of injury/illness/exacerbation, and 3+ comorbidities: generalized polyneuropathy, COPD, DM, NSTEMI, and spinal stenosis are also affecting patient's functional outcome.   REHAB POTENTIAL: Fair    CLINICAL DECISION MAKING: Unstable/unpredictable  EVALUATION COMPLEXITY: High   GOALS: Goals reviewed with patient? No  SHORT TERM GOALS: Target date: 04/16/2024   Pt will be independent with  HEP in order to improve strength and balance in order to decrease fall risk and improve function at home. Baseline:  Goal status: INITIAL   LONG TERM GOALS: Target date: 05/28/2024   Pt will improve ABC by at least 13% in order to demonstrate clinically significant improvement in balance confidence.  Baseline: 03/05/24: 40.6% Goal status: INITIAL  2.  Pt will improve BERG to at least 50/56 points in order to demonstrate clinically significant improvement in balance.   Baseline: 45/56 Goal status: INITIAL  3.  Pt will increase self-selected to at least 0.6 m/s in order to demonstrate clinically significant improvement in limited community ambulation.         Baseline: self-selected: 0.48 m/s; Goal status: INITIAL  4. Pt will be able to complete 5TSTS in <14s in order to demonstrate clinically significant improvement in LE strength. Baseline: 16.3s Goal status: INITIAL  5. Pt will decrease TUG with Lofstrand crutch to below 14 seconds/decrease in order to demonstrate decreased fall risk.  Baseline: 16.1s Goal status: INITIAL  PLAN: PT FREQUENCY: 1-2x/week  PT DURATION: 12 weeks  PLANNED INTERVENTIONS: Therapeutic exercises, Therapeutic activity, Neuromuscular re-education, Balance training, Gait training, Patient/Family education, Self Care, Joint mobilization, Joint manipulation, Vestibular training, Canalith repositioning, Orthotic/Fit training, DME instructions, Electrical stimulation, Cryotherapy, Moist heat, Manual therapy, and Re-evaluation.  PLAN FOR NEXT SESSION: progress strengthening exercises, walking/ambulation speed and endurance tasks, modify/progress HEP as needed;   Perlie Stene D Arrington Bencomo PT, DPT, GCS  Physical Therapist - [place]  [facility] 12:10 PM,03/13/24 "

## 2024-03-12 ENCOUNTER — Ambulatory Visit

## 2024-03-12 DIAGNOSIS — R2681 Unsteadiness on feet: Secondary | ICD-10-CM

## 2024-03-12 DIAGNOSIS — M6281 Muscle weakness (generalized): Secondary | ICD-10-CM

## 2024-03-12 DIAGNOSIS — M5459 Other low back pain: Secondary | ICD-10-CM

## 2024-03-13 NOTE — Therapy (Incomplete)
 " OUTPATIENT PHYSICAL THERAPY BALANCE TREATMENT   Patient Name: Torri Langston. MRN: [id number] DOB:May 23, 1940, 84 y.o., male Today's Date: 03/13/2024  END OF SESSION:   Past Medical History:  Diagnosis Date   Anemia    Anxiety    Arthritis    Arthritis of neck    Atrial fibrillation (HCC)    Cataracts, bilateral    Complication of anesthesia    pt reports low BP's after surgery at [facility] and difficulty awakening   Depression    Diabetes (HCC)    dx 6-8 yrs ago   Dysrhythmia    a-fib   GERD (gastroesophageal reflux disease)    OCC TAKES ALKA SELTZER   History of kidney stones    10-15 yrs ago   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   Hyperlipidemia    Hypertension    Myocardial infarction (HCC) 12/2020   Nocturia    S/P ablation of atrial fibrillation    Ablative therapy   Sleep apnea    CPAP    Spinal stenosis    Tachycardia, unspecified    Past Surgical History:  Procedure Laterality Date   ABLATION     ANTERIOR LAT LUMBAR FUSION N/A 06/27/2017   Procedure: Anterior Lateral Lumbar Interbody  Fusion - Lumbar Two-Lumbar Three - Lumbar Three-Lumbar Four, Posterior Lumbar Interbody Fusion Lumbar Four- Five;  Surgeon: Onetha Kuba, MD;  Location: [facility] OR;  Service: Neurosurgery;  Laterality: N/A;  Anterior Lateral Lumbar Interbody  Fusion - Lumbar Two-Lumbar Three - Lumbar Three-Lumbar Four, Posterior Lumbar Interbody Fusion Lumbar Four- Five   BACK SURGERY     CARDIAC CATHETERIZATION     CARDIOVERSION N/A 08/29/2018   Procedure: CARDIOVERSION;  Surgeon: Hester Wolm PARAS, MD;  Location: [facility] ORS;  Service: Cardiovascular;  Laterality: N/A;   CARDIOVERSION N/A 09/24/2018   Procedure: CARDIOVERSION;  Surgeon: Hester Wolm PARAS, MD;  Location: [facility] ORS;  Service: Cardiovascular;  Laterality: N/A;   COLONOSCOPY WITH PROPOFOL  N/A 10/05/2015   Procedure: COLONOSCOPY WITH PROPOFOL ;  Surgeon: Gladis RAYMOND Mariner, MD;  Location: [facility] ENDOSCOPY;  Service:  Endoscopy;  Laterality: N/A;   COLONOSCOPY WITH PROPOFOL  N/A 11/01/2020   Procedure: COLONOSCOPY WITH PROPOFOL ;  Surgeon: Therisa Bi, MD;  Location: [facility] ENDOSCOPY;  Service: Gastroenterology;  Laterality: N/A;   CORONARY STENT INTERVENTION N/A 12/22/2020   Procedure: CORONARY STENT INTERVENTION;  Surgeon: Ammon Blunt, MD;  Location: [facility] INVASIVE CV LAB;  Service: Cardiovascular;  Laterality: N/A;   ESOPHAGOGASTRODUODENOSCOPY N/A 11/01/2020   Procedure: ESOPHAGOGASTRODUODENOSCOPY (EGD);  Surgeon: Therisa Bi, MD;  Location: [facility] ENDOSCOPY;  Service: Gastroenterology;  Laterality: N/A;   ESOPHAGOGASTRODUODENOSCOPY (EGD) WITH PROPOFOL  N/A 04/01/2018   Procedure: ESOPHAGOGASTRODUODENOSCOPY (EGD) WITH PROPOFOL ;  Surgeon: Mariner Gladis RAYMOND, MD;  Location: [facility] ENDOSCOPY;  Service: Endoscopy;  Laterality: N/A;   ESOPHAGOGASTRODUODENOSCOPY (EGD) WITH PROPOFOL  N/A 06/01/2022   Procedure: ESOPHAGOGASTRODUODENOSCOPY (EGD) WITH PROPOFOL ;  Surgeon: Therisa Bi, MD;  Location: [facility] ENDOSCOPY;  Service: Gastroenterology;  Laterality: N/A;   EYE SURGERY     HERNIA REPAIR     JOINT REPLACEMENT Bilateral    hips  RT+  LEFT X2    LEFT HEART CATH AND CORONARY ANGIOGRAPHY N/A 12/22/2020   Procedure: LEFT HEART CATH AND CORONARY ANGIOGRAPHY;  Surgeon: Ammon Blunt, MD;  Location: [facility] INVASIVE CV LAB;  Service: Cardiovascular;  Laterality: N/A;   LEFT HEART CATH AND CORONARY ANGIOGRAPHY Left 08/23/2021   Procedure: LEFT HEART CATH AND CORONARY ANGIOGRAPHY;  Surgeon: Hester Wolm PARAS, MD;  Location:  [facility] INVASIVE CV LAB;  Service: Cardiovascular;  Laterality: Left;   LUMBAR LAMINECTOMY/DECOMPRESSION MICRODISCECTOMY Left 09/13/2016   Procedure: Microdiscectomy - Lumbar two-three,  Lumbar three- - left;  Surgeon: Onetha Kuba, MD;  Location: [facility] OR;  Service: Neurosurgery;  Laterality: Left;   SPINAL CORD STIMULATOR INSERTION  07/08/2019   TONSILLECTOMY     Patient Active Problem List   Diagnosis  Date Noted   Influenza A virus present 02/25/2024   Gynecomastia, male 01/07/2024   Lip swelling 07/04/2023   Diabetes mellitus treated with oral medication (HCC) 04/05/2023   COPD (chronic obstructive pulmonary disease) (HCC) 10/31/2021   Shortness of breath on exertion 08/04/2021   Diabetes mellitus with proteinuria (HCC) 04/04/2021   GERD without esophagitis 04/02/2021   Coronary artery disease 12/28/2020   History of non-ST elevation myocardial infarction (NSTEMI) 12/21/2020   Pain in right shin 10/25/2020   Peripheral vascular disease 08/06/2020   Chronic pain syndrome 07/06/2020   Cervical facet joint syndrome 04/08/2020   Spinal cord stimulator status 12/11/2019   Elevated TSH 08/20/2019   CKD (chronic kidney disease) stage 3, GFR 30-59 ml/min (HCC) 01/19/2019   Acquired thrombophilia 01/19/2019   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   Postlaminectomy syndrome, lumbar region 01/16/2019   History of fusion of lumbar spine (L2-L5) 01/16/2019   Chronic radicular lumbar pain 01/16/2019   HNP (herniated nucleus pulposus), lumbar 04/29/2018   Advanced care planning/counseling discussion 09/28/2016   Spinal stenosis, lumbar region, 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/13/2016   Hyperlipidemia associated with type 2 diabetes mellitus (HCC) 07/14/2015   Symptomatic anemia 06/30/2015   Benign prostatic hyperplasia without lower urinary tract symptoms 06/02/2015   OSA (obstructive sleep apnea) 03/23/2015   Hypertension associated with diabetes (HCC) 09/28/2014   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HCC) 09/28/2014   H/O prior ablation treatment 10/19/2011   AF (paroxysmal atrial fibrillation) (HCC) 10/19/2011   PCP: Valerio Melanie DASEN, NP  REFERRING PROVIDER: Valerio Melanie DASEN, NP   REFERRING DIAG: R26.89 (ICD-10-CM) - Imbalance   RATIONALE FOR EVALUATION AND TREATMENT: Rehabilitation  THERAPY DIAG: Other low back pain  Muscle weakness (generalized)  ONSET DATE:  01/25/23 (acute on chronic)  FOLLOW-UP APPT SCHEDULED WITH REFERRING PROVIDER: Yes   FROM INITIAL EVALUATION SUBJECTIVE:                                                                                                                                                                                         SUBJECTIVE STATEMENT:  Imbalance and LE weakness;  PERTINENT HISTORY:  Pt previously seen by this clinic. He was referred for ongoing BLE weakness, imbalance, and deconditioning. He was sick recently with  the flu and believes that it has contributed to worsening weakness. Since having the flu he has had an ongoing cough and DOE. No new falls since his last episode of care.  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 He reports weakness in both legs and numbness in toes post surgery in 2018. He experienced a fall on 01/25/2023 at home, resulting in a L hip injury. No residual pain in hip at this time. No additional falls since December. He reports progressive LE weakness impairing his functional ability at home. He is known to this clinic from prior episodes of care for similar concerns.  Prior history: 11/02/22 Patient reports to physical therapy today with a chief concern of muscle weakness and imbalance. Patient reports progressive worsening in balance and muscular strength. He states that he has loss of sensation in his toes he reports they are asleep. Patient reports pain in the anterior thigh and bilateral calves. He currently uses an loftstand crutch in his RUE. Lately patient has reported difficulty with dressing LE,  performing fine motor movements with left hand. He denies falls, saddle parasthesia, nausea, vomitting, night sweats.    10/17/2022 Patient reported he is not doing well lately. He has been seeing Dr. Clois. He reports his legs are so weak that he can hardly walk, so he has been using a crutch for the last 1-2 years. Reported numbness and loss of sensation in his toes. He also reported that recently his left hand has began to has some numbness to where it feels like it is asleep. Denies pain, loss of sensation, coldness, burning. Denies falls.  Imaging: NCS conducted on 11/12/2018: Abnormal study. 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 There is also preliminary evidence of a superimposed left lower lumbosacral polyradiculopathy, based on NCV and distal needle exam results. I cannot test lumbar paraspinals due to patient on Eliquis .    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  Recent memory is intact.  Remote memory  is intact.  Attention span and concentration are intact.  Expressive speech is intact.  Patient's fund of knowledge is within normal limits for educational level.    Gross Musculoskeletal Assessment Tremor: None Bulk: Decreased BLE bulk Tone: Normal  Posture: Forward head and rounded shoulders, flat lower back;  AROM Deferred specific measurements. Functional motion intact but spinal mobility very limited;  LE MMT: MMT (out of 5) Right  Left   Hip flexion 4+ 4+  Hip extension    Hip abduction (seated) 4 4  Hip adduction (seated 4 4  Hip internal rotation    Hip external rotation    Knee flexion (seated) 5 5  Knee extension 5 5  Ankle dorsiflexion 4+ 4+  Ankle plantarflexion Active Active  (* = pain; Blank rows = not tested)  Transfers: Assistive device utilized: Single Lofstrand RUE  Sit to stand: Modified independence Stand to sit: Modified independence Chair to chair: Modified independence Floor: Deferred  Stairs: Deferred  Gait: Gait pattern: decreased step length- Right, decreased step length- Left, trunk flexed, poor foot clearance- Right, and poor foot clearance- Left Distance walked: 150' over the course of evaluation Assistive device utilized: Single Lofstrand in RUE Level of assistance: Modified independence Comments: Slow and labored ambulation. Decreased self selected speed  Functional Outcome Measures  03/05/24 Comments  BERG 45/56; Increased fall risk  DGI    ABC    TUG 21.1 seconds no crutch, 16.1 seconds with crutch Increased fall risk  5TSTS 16.3s Increased fall risk  6 Minute Walk Test     10 Meter Gait Speed Self-selected: 20.9s = 0.48 m/s; Fastest: 17.4s = 0.57 m/s Below community ambulation speed (0.8 m/s)   Vitals: Seated: BP: 129/64 mmHg, HR: 76 bpm, SpO2: 100%   TODAY'S TREATMENT:   SUBJECTIVE: Pt reports that he has been having some L posterior hip and L low back pain upon arrival today. He had his spinal cord stimulator remotely adjusted with no notable improvement. Reports 7/10 pain upon arrival.    PAIN: 7/10 L low back and L posterior hip pain.   Ther-ex  NuStep L1-2 x 5 minutes for L hip AROM and light strengthening during interval history; Isometric clams 5s hold x 10; Isometric adductor squeezes 5s hold x 10; Hooklying alternating marches x 10; L hip gentle FADIR stretch 2 x 45s; L hip gentle single knee to chest stretch 2 x 45s;   Manual Therapy   STM to L posterior and lateral hip including TFL, glut max/med, and deep hip external rotators. Pt with palpable trigger point in L posterior hip/piriformis with concordant pain including pain radiating down posterior thigh;    PATIENT EDUCATION:  Education details: Plan of care and examination findings; Person educated: Patient Education method: Explanation Education comprehension: verbalized understanding   HOME EXERCISE PROGRAM:  HEP from prior episode of care Access Code: [email or web address] URL: [email or web address] Date: 05/31/2023 Prepared by: Selinda Eck  Exercises - Seated Long Arc Quad  - 1 x daily - 3-4 x weekly - 3 sets - 10 reps - 2s hold - Seated Hip Abduction with Resistance  - 1 x daily - 3-4 x weekly - 3 sets - 10 reps - 2s hold - Heel Raises with Counter Support  - 1 x daily - 3-4 x weekly - 3 sets - 10 reps - 2s hold - Mini Squat with Counter Support  - 1 x daily - 3-4 x weekly - 3 sets - 10 reps - Semi-Tandem Balance at [employer]  Open  - 1 x daily - 7 x weekly - 2-3 sets - 10 reps - 10-30 hold   ASSESSMENT:  CLINICAL IMPRESSION:    Pt arrives today  reporting L posterior hip and L low back pain which is significantly limited his mobility. Initiated STM with very light strengthening/AROM in order to decrease pain. Pt reports significant improvement in his pain at the end of his session and is able to ambulate with less pain, more upright posture, and improved self-selected speed at the end of the session. Plan to return to conditioning and balance training at next session as pt is able. Patient would benefit from skilled physical therapy focused on improving LE strength and balance in order to demonstrate decreased fall risk improved functional mobility.   OBJECTIVE IMPAIRMENTS: Abnormal gait, decreased balance, difficulty walking, decreased strength, impaired sensation, and pain.   ACTIVITY LIMITATIONS: bending, standing, squatting, stairs, transfers, and caring for others  PARTICIPATION LIMITATIONS: meal prep, cleaning, laundry, shopping, and community activity  PERSONAL FACTORS: Age, Past/current experiences, Time since onset of injury/illness/exacerbation, and 3+ comorbidities: generalized polyneuropathy, COPD, DM, NSTEMI, and spinal stenosis are also affecting patient's functional outcome.   REHAB POTENTIAL: Fair    CLINICAL DECISION MAKING: Unstable/unpredictable  EVALUATION COMPLEXITY: High   GOALS: Goals reviewed with patient? No  SHORT TERM GOALS: Target date: 04/16/2024   Pt will be independent with HEP in order to improve strength and balance in order to decrease fall risk and improve function at home. Baseline:  Goal status: INITIAL   LONG TERM GOALS: Target date: 05/28/2024   Pt will improve ABC by at least 13% in order to demonstrate clinically significant improvement in balance confidence.  Baseline: 03/05/24: 40.6% Goal status: INITIAL  2.  Pt will improve BERG to at least 50/56 points in order to demonstrate clinically significant improvement in balance.   Baseline: 45/56 Goal status: INITIAL  3.  Pt will  increase self-selected to at least 0.6 m/s in order to demonstrate clinically significant improvement in limited community ambulation.         Baseline: self-selected: 0.48 m/s; Goal status: INITIAL  4. Pt will be able to complete 5TSTS in <14s in order to demonstrate clinically significant improvement in LE strength. Baseline: 16.3s Goal status: INITIAL  5. Pt will decrease TUG with Lofstrand crutch to below 14 seconds/decrease in order to demonstrate decreased fall risk.  Baseline: 16.1s Goal status: INITIAL  PLAN: PT FREQUENCY: 1-2x/week  PT DURATION: 12 weeks  PLANNED INTERVENTIONS: Therapeutic exercises, Therapeutic activity, Neuromuscular re-education, Balance training, Gait training, Patient/Family education, Self Care, Joint mobilization, Joint manipulation, Vestibular training, Canalith repositioning, Orthotic/Fit training, DME instructions, Electrical stimulation, Cryotherapy, Moist heat, Manual therapy, and Re-evaluation.  PLAN FOR NEXT SESSION: progress strengthening exercises, walking/ambulation speed and endurance tasks, modify/progress HEP as needed;   Kayleanna Lorman D Mouhamed Glassco PT, DPT, GCS  Physical Therapist - [place]  [facility] 12:28 PM,03/13/24 "

## 2024-03-17 ENCOUNTER — Ambulatory Visit

## 2024-03-17 DIAGNOSIS — M6281 Muscle weakness (generalized): Secondary | ICD-10-CM

## 2024-03-17 DIAGNOSIS — M5459 Other low back pain: Secondary | ICD-10-CM

## 2024-03-19 ENCOUNTER — Ambulatory Visit: Attending: Neurology

## 2024-03-19 DIAGNOSIS — M6281 Muscle weakness (generalized): Secondary | ICD-10-CM

## 2024-03-19 DIAGNOSIS — M5459 Other low back pain: Secondary | ICD-10-CM

## 2024-03-21 ENCOUNTER — Other Ambulatory Visit: Payer: Self-pay | Admitting: Nurse Practitioner

## 2024-03-24 ENCOUNTER — Ambulatory Visit

## 2024-03-26 ENCOUNTER — Ambulatory Visit

## 2024-03-31 ENCOUNTER — Ambulatory Visit

## 2024-04-02 ENCOUNTER — Ambulatory Visit

## 2024-04-03 ENCOUNTER — Ambulatory Visit: Admitting: Orthopedic Surgery

## 2024-04-07 ENCOUNTER — Ambulatory Visit

## 2024-04-08 ENCOUNTER — Ambulatory Visit: Admitting: Nurse Practitioner

## 2024-04-09 ENCOUNTER — Ambulatory Visit

## 2024-04-16 ENCOUNTER — Ambulatory Visit: Admitting: Internal Medicine

## 2024-04-16 ENCOUNTER — Other Ambulatory Visit

## 2024-04-16 ENCOUNTER — Ambulatory Visit

## 2024-04-16 ENCOUNTER — Ambulatory Visit: Admitting: Urology

## 2024-04-17 ENCOUNTER — Ambulatory Visit: Admitting: Urology

## 2024-06-12 ENCOUNTER — Ambulatory Visit: Admitting: Student in an Organized Health Care Education/Training Program

## 2024-06-12 ENCOUNTER — Encounter
